# Patient Record
Sex: Male | Born: 1945
Health system: Southern US, Community
[De-identification: ages and names within clinical notes are randomized; demographics above are authoritative.]

## PROBLEM LIST (undated history)

## (undated) DIAGNOSIS — E039 Hypothyroidism, unspecified: Secondary | ICD-10-CM

## (undated) DIAGNOSIS — D649 Anemia, unspecified: Secondary | ICD-10-CM

## (undated) DIAGNOSIS — Z992 Dependence on renal dialysis: Secondary | ICD-10-CM

## (undated) DIAGNOSIS — I96 Gangrene, not elsewhere classified: Secondary | ICD-10-CM

## (undated) DIAGNOSIS — R011 Cardiac murmur, unspecified: Secondary | ICD-10-CM

## (undated) DIAGNOSIS — I739 Peripheral vascular disease, unspecified: Secondary | ICD-10-CM

## (undated) DIAGNOSIS — M2450 Contracture, unspecified joint: Secondary | ICD-10-CM

## (undated) DIAGNOSIS — R112 Nausea with vomiting, unspecified: Secondary | ICD-10-CM

## (undated) DIAGNOSIS — C9000 Multiple myeloma not having achieved remission: Secondary | ICD-10-CM

## (undated) DIAGNOSIS — I10 Essential (primary) hypertension: Secondary | ICD-10-CM

## (undated) DIAGNOSIS — E1151 Type 2 diabetes mellitus with diabetic peripheral angiopathy without gangrene: Secondary | ICD-10-CM

## (undated) DIAGNOSIS — L89152 Pressure ulcer of sacral region, stage 2: Secondary | ICD-10-CM

## (undated) DIAGNOSIS — R7881 Bacteremia: Secondary | ICD-10-CM

## (undated) DIAGNOSIS — N2581 Secondary hyperparathyroidism of renal origin: Secondary | ICD-10-CM

## (undated) DIAGNOSIS — H409 Unspecified glaucoma: Secondary | ICD-10-CM

## (undated) DIAGNOSIS — N186 End stage renal disease: Secondary | ICD-10-CM

## (undated) DIAGNOSIS — J45909 Unspecified asthma, uncomplicated: Secondary | ICD-10-CM

## (undated) DIAGNOSIS — E079 Disorder of thyroid, unspecified: Secondary | ICD-10-CM

## (undated) DIAGNOSIS — J189 Pneumonia, unspecified organism: Secondary | ICD-10-CM

## (undated) DIAGNOSIS — M1990 Unspecified osteoarthritis, unspecified site: Secondary | ICD-10-CM

## (undated) DIAGNOSIS — Z9889 Other specified postprocedural states: Secondary | ICD-10-CM

## (undated) HISTORY — PX: TOE AMPUTATION: SHX809

## (undated) HISTORY — DX: Peripheral vascular disease, unspecified: I73.9

## (undated) HISTORY — PX: CARDIAC CATHETERIZATION: SHX172

## (undated) HISTORY — DX: Gangrene, not elsewhere classified: I96

## (undated) HISTORY — DX: Bacteremia: R78.81

## (undated) HISTORY — DX: Multiple myeloma not having achieved remission: C90.00

## (undated) HISTORY — PX: EYE SURGERY: SHX253

## (undated) HISTORY — DX: Dependence on renal dialysis: N18.6

## (undated) HISTORY — DX: End stage renal disease: Z99.2

## (undated) HISTORY — DX: Disorder of thyroid, unspecified: E07.9

## (undated) HISTORY — PX: OTHER SURGICAL HISTORY: SHX169

## (undated) HISTORY — PX: CERVICAL DISC SURGERY: SHX588

## (undated) HISTORY — DX: Anemia, unspecified: D64.9

## (undated) HISTORY — DX: Pressure ulcer of sacral region, stage 2: L89.152

## (undated) HISTORY — DX: Type 2 diabetes mellitus with diabetic peripheral angiopathy without gangrene: E11.51

---

## 1997-04-19 ENCOUNTER — Encounter: Admission: RE | Admit: 1997-04-19 | Discharge: 1997-04-19 | Payer: Self-pay | Admitting: Family Medicine

## 1997-05-20 ENCOUNTER — Encounter: Admission: RE | Admit: 1997-05-20 | Discharge: 1997-05-20 | Payer: Self-pay | Admitting: Family Medicine

## 1997-06-14 ENCOUNTER — Encounter: Admission: RE | Admit: 1997-06-14 | Discharge: 1997-06-14 | Payer: Self-pay | Admitting: Family Medicine

## 1997-11-30 ENCOUNTER — Encounter: Admission: RE | Admit: 1997-11-30 | Discharge: 1997-11-30 | Payer: Self-pay | Admitting: Family Medicine

## 1998-01-04 ENCOUNTER — Encounter: Admission: RE | Admit: 1998-01-04 | Discharge: 1998-01-04 | Payer: Self-pay | Admitting: Sports Medicine

## 1998-03-03 ENCOUNTER — Encounter: Admission: RE | Admit: 1998-03-03 | Discharge: 1998-03-03 | Payer: Self-pay | Admitting: Family Medicine

## 1998-03-10 ENCOUNTER — Encounter: Admission: RE | Admit: 1998-03-10 | Discharge: 1998-03-10 | Payer: Self-pay | Admitting: Family Medicine

## 1998-04-12 ENCOUNTER — Encounter: Admission: RE | Admit: 1998-04-12 | Discharge: 1998-04-12 | Payer: Self-pay | Admitting: Family Medicine

## 1998-08-23 ENCOUNTER — Emergency Department (HOSPITAL_COMMUNITY): Admission: EM | Admit: 1998-08-23 | Discharge: 1998-08-23 | Payer: Self-pay | Admitting: Emergency Medicine

## 1998-08-24 ENCOUNTER — Emergency Department (HOSPITAL_COMMUNITY): Admission: EM | Admit: 1998-08-24 | Discharge: 1998-08-24 | Payer: Self-pay | Admitting: Emergency Medicine

## 1999-03-07 ENCOUNTER — Encounter: Payer: Self-pay | Admitting: Urology

## 1999-03-07 ENCOUNTER — Encounter: Admission: RE | Admit: 1999-03-07 | Discharge: 1999-03-07 | Payer: Self-pay | Admitting: Urology

## 1999-03-22 ENCOUNTER — Encounter: Payer: Self-pay | Admitting: Urology

## 1999-03-22 ENCOUNTER — Ambulatory Visit (HOSPITAL_COMMUNITY): Admission: RE | Admit: 1999-03-22 | Discharge: 1999-03-23 | Payer: Self-pay | Admitting: Urology

## 1999-10-22 ENCOUNTER — Encounter: Payer: Self-pay | Admitting: Neurological Surgery

## 1999-10-22 ENCOUNTER — Ambulatory Visit (HOSPITAL_COMMUNITY): Admission: RE | Admit: 1999-10-22 | Discharge: 1999-10-22 | Payer: Self-pay | Admitting: Neurological Surgery

## 1999-10-29 ENCOUNTER — Ambulatory Visit (HOSPITAL_COMMUNITY): Admission: RE | Admit: 1999-10-29 | Discharge: 1999-10-30 | Payer: Self-pay | Admitting: Neurological Surgery

## 1999-10-29 ENCOUNTER — Encounter: Payer: Self-pay | Admitting: Urology

## 1999-11-21 ENCOUNTER — Encounter: Payer: Self-pay | Admitting: Neurological Surgery

## 1999-11-21 ENCOUNTER — Encounter: Admission: RE | Admit: 1999-11-21 | Discharge: 1999-11-21 | Payer: Self-pay | Admitting: Neurological Surgery

## 2001-05-20 ENCOUNTER — Emergency Department (HOSPITAL_COMMUNITY): Admission: EM | Admit: 2001-05-20 | Discharge: 2001-05-20 | Payer: Self-pay | Admitting: Emergency Medicine

## 2001-05-21 ENCOUNTER — Encounter: Payer: Self-pay | Admitting: Emergency Medicine

## 2004-01-08 ENCOUNTER — Emergency Department: Payer: Self-pay | Admitting: Emergency Medicine

## 2006-07-30 ENCOUNTER — Emergency Department (HOSPITAL_COMMUNITY): Admission: EM | Admit: 2006-07-30 | Discharge: 2006-07-31 | Payer: Self-pay | Admitting: Emergency Medicine

## 2007-05-03 ENCOUNTER — Emergency Department (HOSPITAL_COMMUNITY): Admission: EM | Admit: 2007-05-03 | Discharge: 2007-05-03 | Payer: Self-pay | Admitting: Emergency Medicine

## 2008-03-06 ENCOUNTER — Emergency Department (HOSPITAL_COMMUNITY): Admission: EM | Admit: 2008-03-06 | Discharge: 2008-03-06 | Payer: Self-pay | Admitting: Emergency Medicine

## 2008-08-15 ENCOUNTER — Emergency Department (HOSPITAL_COMMUNITY): Admission: EM | Admit: 2008-08-15 | Discharge: 2008-08-15 | Payer: Self-pay | Admitting: Emergency Medicine

## 2008-11-08 ENCOUNTER — Emergency Department (HOSPITAL_COMMUNITY): Admission: EM | Admit: 2008-11-08 | Discharge: 2008-11-08 | Payer: Self-pay | Admitting: Emergency Medicine

## 2009-01-03 ENCOUNTER — Emergency Department (HOSPITAL_COMMUNITY): Admission: EM | Admit: 2009-01-03 | Discharge: 2009-01-03 | Payer: Self-pay | Admitting: Emergency Medicine

## 2009-07-18 ENCOUNTER — Inpatient Hospital Stay (HOSPITAL_COMMUNITY): Admission: EM | Admit: 2009-07-18 | Discharge: 2009-07-20 | Payer: Self-pay | Admitting: Emergency Medicine

## 2010-04-01 LAB — BASIC METABOLIC PANEL
BUN: 16 mg/dL (ref 6–23)
GFR calc Af Amer: 37 mL/min — ABNORMAL LOW (ref >60)
Glucose, Bld: 92 mg/dL (ref 70–99)
Potassium: 3 mEq/L — ABNORMAL LOW (ref 3.5–5.1)

## 2010-04-01 LAB — POCT I-STAT, CHEM 8
BUN: 19 mg/dL (ref 6–23)
Calcium, Ion: 1.03 mmol/L — ABNORMAL LOW (ref 1.12–1.32)
Chloride: 96 mEq/L (ref 96–112)
HCT: 38 % — ABNORMAL LOW (ref 39.0–52.0)
Hemoglobin: 12.9 g/dL — ABNORMAL LOW (ref 13.0–17.0)
Potassium: 3.1 mEq/L — ABNORMAL LOW (ref 3.5–5.1)
TCO2: 30 mmol/L (ref 0–100)

## 2010-04-01 LAB — GLUCOSE, CAPILLARY
Glucose-Capillary: 108 mg/dL — ABNORMAL HIGH (ref 70–99)
Glucose-Capillary: 120 mg/dL — ABNORMAL HIGH (ref 70–99)
Glucose-Capillary: 154 mg/dL — ABNORMAL HIGH (ref 70–99)
Glucose-Capillary: 157 mg/dL — ABNORMAL HIGH (ref 70–99)

## 2010-04-01 LAB — CBC
Hemoglobin: 11.9 g/dL — ABNORMAL LOW (ref 13.0–17.0)
MCH: 29.2 pg (ref 26.0–34.0)
MCHC: 34 g/dL (ref 30.0–36.0)
MCV: 85.9 fL (ref 78.0–100.0)
RDW: 14.9 % (ref 11.5–15.5)

## 2010-04-01 LAB — DIFFERENTIAL
Basophils Absolute: 0 10*3/uL (ref 0.0–0.1)
Eosinophils Absolute: 0.1 10*3/uL (ref 0.0–0.7)
Eosinophils Relative: 1 % (ref 0–5)
Lymphocytes Relative: 14 % (ref 12–46)
Neutro Abs: 4.6 10*3/uL (ref 1.7–7.7)
Neutrophils Relative %: 75 % (ref 43–77)

## 2010-04-01 LAB — URINE MICROSCOPIC-ADD ON

## 2010-04-01 LAB — CULTURE, BLOOD (ROUTINE X 2): Culture: NO GROWTH

## 2010-04-01 LAB — URINALYSIS, ROUTINE W REFLEX MICROSCOPIC
Glucose, UA: NEGATIVE mg/dL
Ketones, ur: NEGATIVE mg/dL
Leukocytes, UA: NEGATIVE
Protein, ur: >300 mg/dL — AB
Specific Gravity, Urine: 1.016 (ref 1.005–1.030)
Urobilinogen, UA: 2 mg/dL — ABNORMAL HIGH (ref 0.0–1.0)

## 2010-04-01 LAB — CK TOTAL AND CKMB (NOT AT ARMC): Total CK: 176 U/L (ref 7–232)

## 2010-04-01 LAB — HEMOGLOBIN A1C: Hgb A1c MFr Bld: 7.2 % — ABNORMAL HIGH (ref <5.7)

## 2010-04-16 LAB — URINE MICROSCOPIC-ADD ON

## 2010-04-16 LAB — POCT I-STAT, CHEM 8
BUN: 19 mg/dL (ref 6–23)
Calcium, Ion: 1.13 mmol/L (ref 1.12–1.32)
Creatinine, Ser: 1.7 mg/dL — ABNORMAL HIGH (ref 0.4–1.5)
Hemoglobin: 12.2 g/dL — ABNORMAL LOW (ref 13.0–17.0)
TCO2: 27 mmol/L (ref 0–100)

## 2010-04-16 LAB — URINALYSIS, ROUTINE W REFLEX MICROSCOPIC
Ketones, ur: 40 mg/dL — AB
Specific Gravity, Urine: 1.02 (ref 1.005–1.030)
Urobilinogen, UA: 4 mg/dL — ABNORMAL HIGH (ref 0.0–1.0)
pH: 6 (ref 5.0–8.0)

## 2010-04-16 LAB — URINE CULTURE: Colony Count: 8000

## 2010-04-22 LAB — URINALYSIS, ROUTINE W REFLEX MICROSCOPIC
Bilirubin Urine: NEGATIVE
Glucose, UA: NEGATIVE mg/dL
Ketones, ur: NEGATIVE mg/dL
Protein, ur: >300 mg/dL — AB

## 2010-04-22 LAB — URINE MICROSCOPIC-ADD ON

## 2010-04-30 ENCOUNTER — Other Ambulatory Visit: Payer: Self-pay | Admitting: Emergency Medicine

## 2010-05-21 ENCOUNTER — Other Ambulatory Visit: Payer: Self-pay

## 2010-05-21 NOTE — Telephone Encounter (Signed)
Needs a refill on Diltiazem 30 mg take one tablet four times a day.  Last filled 01/30/2010.  Dr. Mariah Milling is not following the patient.  The patient has not been in office and has no follow up appointment scheduled.   Refill for Diltiazem is being denied. Refill request faxed to pharmacy as denied.

## 2010-06-01 NOTE — Op Note (Signed)
[name]  Patient:    Jack [name], Jack [name]                   MRN: [id number] Proc. Date: 10/29/99 Adm. Date:  [id number] Disc. Date: [id number] Attending:  Jonne Ply                           Operative Report  PREOPERATIVE DIAGNOSIS:  C3-C4 spondylosis with myelopathy.  POSTOPERATIVE DIAGNOSIS:  C3-C4 spondylosis with myelopathy.  PROCEDURE:  Anterior cervical diskectomy and arthrodesis with C3-C4 with Synthes fixation and structural allograft.  SURGEON:  Dr. Danielle Dess.  ANESTHESIA:  General endotracheal.  INDICATIONS:  The patient is a 65 year old individual who has had significant neck, shoulder, and arm pain with progressive myelopathy in the lower extremities.  He is advised regarding surgical decompression and stabilization at C3-C4.  DESCRIPTION OF PROCEDURE:  The patient was brought to the operating room and placed on a table in supine position.  After smooth induction of general endotracheal anesthesia the neck was shaved, prepped with DuraPrep, and draped in sterile fashion.  Placed in 5 pounds of halter traction.   A transverse incision was made in the left portion of the neck, and this was carried down through the platysma.  The plane between the sternocleidomastoid and the strap muscles was dissected bluntly until the prevertebral space was reached.  C3-C4 was identified positively on a radiography, and diskectomy of C3-C4 was undertaken using a combination of curettes and rongeurs to remove the disk space.  Posterior osteophytes were removed from the inferior body of C3 and the uncinate processes at C4 using a Midas Rex and M-2 bur.  Lateral recesses were well decompressed.  A 2-mm Kerrison punch was used to take up the thickened, stretched posterior longitudinal ligament which allowed for good decompression of the dural tube.  Hemostasis was obtained with some bipolar cautery and some judicious use of Gelfoam-soaked  pledgets with thrombin which were later removed.  A 7-mm round fibular graft was placed into the interspace after this area was decompressed, traction was removed, and the neck was placed in slight flexion.  A 16-mm Synthes plate was then affixed to the anterior portion of the spinal canal using 16-mm locking screws.  The patient tolerated the procedure well.  The area was copiously irrigated with antibiotic irrigating solution.  Hemostasis was doubly checked, and the platysma was closed with 2-0 Vicryl, and 3-0 Vicryl was used subcuticularly on the skin.  The patient tolerated the procedure well. DD:  11/15/99 TD:  11/16/99 Job: [id number]

## 2010-10-09 LAB — DIFFERENTIAL
Basophils Relative: 0
Lymphocytes Relative: 16
Lymphs Abs: 1.3
Monocytes Relative: 11
Neutro Abs: 6.2
Neutrophils Relative %: 72

## 2010-10-09 LAB — POCT I-STAT, CHEM 8
Chloride: 95 — ABNORMAL LOW
Glucose, Bld: 267 — ABNORMAL HIGH
HCT: 41
Potassium: 3.2 — ABNORMAL LOW

## 2010-10-09 LAB — RAPID STREP SCREEN (MED CTR MEBANE ONLY): Streptococcus, Group A Screen (Direct): NEGATIVE

## 2010-10-09 LAB — CBC
MCHC: 32.5
RBC: 4.68
WBC: 8.5

## 2010-10-29 LAB — I-STAT 8, (EC8 V) (CONVERTED LAB)
BUN: 14
Glucose, Bld: 221 — ABNORMAL HIGH
Hemoglobin: 13.9
Potassium: 3.1 — ABNORMAL LOW
Sodium: 140

## 2010-10-29 LAB — CBC
Hemoglobin: 12.5 — ABNORMAL LOW
MCHC: 33.7
RBC: 4.43

## 2010-10-29 LAB — POCT I-STAT CREATININE
Creatinine, Ser: 1.4
Operator id: 272551

## 2010-12-31 ENCOUNTER — Telehealth: Payer: Self-pay | Admitting: *Deleted

## 2010-12-31 NOTE — Telephone Encounter (Signed)
patient confirmed over the phone on 01-31-2011 starting at 8:30am

## 2010-12-31 NOTE — Telephone Encounter (Signed)
Na

## 2011-01-01 ENCOUNTER — Telehealth: Payer: Self-pay | Admitting: Oncology

## 2011-01-01 NOTE — Telephone Encounter (Signed)
Referred by Dr. Arrie Aran Dx- MGUS

## 2011-01-07 ENCOUNTER — Telehealth: Payer: Self-pay | Admitting: *Deleted

## 2011-01-07 NOTE — Telephone Encounter (Signed)
PATIENT CONFIRMED OVER THE PHONE THE NEW DATE AND TIME ON 01-28-2011 STARTING AT 8:30AM

## 2011-01-28 ENCOUNTER — Telehealth: Payer: Self-pay | Admitting: Oncology

## 2011-01-28 ENCOUNTER — Other Ambulatory Visit: Payer: Medicare Other | Admitting: Lab

## 2011-01-28 ENCOUNTER — Other Ambulatory Visit: Payer: Self-pay | Admitting: Oncology

## 2011-01-28 ENCOUNTER — Ambulatory Visit: Payer: Medicare Other

## 2011-01-28 ENCOUNTER — Ambulatory Visit (HOSPITAL_BASED_OUTPATIENT_CLINIC_OR_DEPARTMENT_OTHER): Payer: Medicare Other | Admitting: Oncology

## 2011-01-28 ENCOUNTER — Ambulatory Visit (HOSPITAL_COMMUNITY)
Admission: RE | Admit: 2011-01-28 | Discharge: 2011-01-28 | Disposition: A | Discharge status: Home / Self Care | Payer: Medicare Other | Source: Ambulatory Visit | Attending: Oncology | Admitting: Oncology

## 2011-01-28 ENCOUNTER — Ambulatory Visit (HOSPITAL_BASED_OUTPATIENT_CLINIC_OR_DEPARTMENT_OTHER): Payer: Medicare Other

## 2011-01-28 DIAGNOSIS — E119 Type 2 diabetes mellitus without complications: Secondary | ICD-10-CM

## 2011-01-28 DIAGNOSIS — I10 Essential (primary) hypertension: Secondary | ICD-10-CM | POA: Insufficient documentation

## 2011-01-28 DIAGNOSIS — M47812 Spondylosis without myelopathy or radiculopathy, cervical region: Secondary | ICD-10-CM | POA: Insufficient documentation

## 2011-01-28 DIAGNOSIS — D649 Anemia, unspecified: Secondary | ICD-10-CM | POA: Insufficient documentation

## 2011-01-28 DIAGNOSIS — D472 Monoclonal gammopathy: Secondary | ICD-10-CM

## 2011-01-28 LAB — CBC & DIFF AND RETIC
Basophils Absolute: 0 10*3/uL (ref 0.0–0.1)
Eosinophils Absolute: 0.1 10*3/uL (ref 0.0–0.5)
HCT: 32.8 % — ABNORMAL LOW (ref 38.4–49.9)
HGB: 10.6 g/dL — ABNORMAL LOW (ref 13.0–17.1)
Immature Retic Fract: 6.2 % (ref 3.00–10.60)
MCH: 26.7 pg — ABNORMAL LOW (ref 27.2–33.4)
MCV: 82.6 fL (ref 79.3–98.0)
NEUT#: 4 10*3/uL (ref 1.5–6.5)
NEUT%: 69.3 % (ref 39.0–75.0)
RDW: 14.7 % — ABNORMAL HIGH (ref 11.0–14.6)
Retic Ct Abs: 45.66 10*3/uL (ref 34.80–93.90)
lymph#: 1 10*3/uL (ref 0.9–3.3)

## 2011-01-28 NOTE — Patient Instructions (Signed)
Multiple Myeloma Multiple myeloma is the most common cancer of bone. It is caused by the uncontrolled multiplication of a type of white blood cell in the marrow. This white blood cell is called a plasma cell. This means the bone marrow is overworking producing plasma cells. Soon these overproduced cells begin to take up room in the marrow that is needed by other cells. This means that there are soon not enough red or white blood cells or platelets. Not enough red cells mean that the person is anemic. There are not enough red blood cells to carry oxygen around the body. There are not enough white blood cells to fight disease. This causes the person with multiple myeloma to not feel well. There is also bone pain through much of the body. SYMPTOMS  Anemia causes fatigue (tiredness) and weakness.     Back pain is common. This is from fractures (break in bones) caused by damage to the bones of the back.     Lack of white blood cells makes infection more likely.     Bleeding is a common problem from lack of the cells (platelets). Platelets help blood clots form. This may show up as bleeding from any place. Commonly this shows up as bleeding from the nose or gums.     Fractures (bone breaks) are more common anywhere. The back and ribs are the most commonly fractured areas.  DIAGNOSIS   This tumor is often suggested by blood tests. Often doing a bone marrow sample makes the diagnosis (learning what is wrong). This is a test performed by taking a small sample of bone with a small needle. This bone often comes from the sternum (breast bone). This sample is sent to a pathologist (a specialist in looking at tissue under a microscope). After looking at the sample under the microscope, the pathologist is able to make a diagnosis of the problem. X-rays may also show boney changes. TREATMENT    Occasionally, anti-cancer medications may be used with multiple myeloma. Your caregiver can discuss this with you.      Medications can also be given to help with the bone pain.     There is no cure for multiple myeloma. Lifestyle changes can add years of quality living.  HOME CARE INSTRUCTIONS   Often there is no specific treatment for multiple myeloma. Most of the treatment consists of adjustments in dietary and living activities. Some of these changes include:  Your dietitian or caregiver helping you with your dietary questions.     Taking iron and vitamins as prescribed by your caregiver.     Eating a well balanced diet.     Staying active, but follow restrictions suggested by your caregiver. Avoiding heavy lifting (more than 10 pounds) and activities that cause increased pain.     Drinking plenty of water.     Using back braces and a cane may help with some of the boney pain.  SEEK IMMEDIATE MEDICAL CARE IF:  You develop severe, uncontrolled boney pain.     You or your family notices confusion, problems with decision-making or inability to stay awake.     You notice increased urination or constipation.     You notice problems holding your water or stool.     You have numbness or loss of control of your extremities (arms/hands or legs/feet).  Document Released: 09/25/2000 Document Revised: 09/12/2010 Document Reviewed: 12/27/2007 [facility] Patient Information 2012 [place], [place].Multiple Myeloma Multiple myeloma is the most common cancer of bone. It  is caused by the uncontrolled multiplication of a type of white blood cell in the marrow. This white blood cell is called a plasma cell. This means the bone marrow is overworking producing plasma cells. Soon these overproduced cells begin to take up room in the marrow that is needed by other cells. This means that there are soon not enough red or white blood cells or platelets. Not enough red cells mean that the person is anemic. There are not enough red blood cells to carry oxygen around the body. There are not enough white blood cells to fight  disease. This causes the person with multiple myeloma to not feel well. There is also bone pain through much of the body. SYMPTOMS  Anemia causes fatigue (tiredness) and weakness.     Back pain is common. This is from fractures (break in bones) caused by damage to the bones of the back.     Lack of white blood cells makes infection more likely.     Bleeding is a common problem from lack of the cells (platelets). Platelets help blood clots form. This may show up as bleeding from any place. Commonly this shows up as bleeding from the nose or gums.     Fractures (bone breaks) are more common anywhere. The back and ribs are the most commonly fractured areas.  DIAGNOSIS   This tumor is often suggested by blood tests. Often doing a bone marrow sample makes the diagnosis (learning what is wrong). This is a test performed by taking a small sample of bone with a small needle. This bone often comes from the sternum (breast bone). This sample is sent to a pathologist (a specialist in looking at tissue under a microscope). After looking at the sample under the microscope, the pathologist is able to make a diagnosis of the problem. X-rays may also show boney changes. TREATMENT    Occasionally, anti-cancer medications may be used with multiple myeloma. Your caregiver can discuss this with you.     Medications can also be given to help with the bone pain.     There is no cure for multiple myeloma. Lifestyle changes can add years of quality living.  HOME CARE INSTRUCTIONS   Often there is no specific treatment for multiple myeloma. Most of the treatment consists of adjustments in dietary and living activities. Some of these changes include:  Your dietitian or caregiver helping you with your dietary questions.     Taking iron and vitamins as prescribed by your caregiver.     Eating a well balanced diet.     Staying active, but follow restrictions suggested by your caregiver. Avoiding heavy lifting (more  than 10 pounds) and activities that cause increased pain.     Drinking plenty of water.     Using back braces and a cane may help with some of the boney pain.  SEEK IMMEDIATE MEDICAL CARE IF:  You develop severe, uncontrolled boney pain.     You or your family notices confusion, problems with decision-making or inability to stay awake.     You notice increased urination or constipation.     You notice problems holding your water or stool.     You have numbness or loss of control of your extremities (arms/hands or legs/feet).  Document Released: 09/25/2000 Document Revised: 09/12/2010 Document Reviewed: 12/27/2007 [facility] Patient Information 2012 [place], [place].

## 2011-01-28 NOTE — Telephone Encounter (Signed)
gve the pt his jan,feb 2013 appt calendar along with the skeletal bone survey appt

## 2011-01-28 NOTE — Progress Notes (Signed)
Referral MD Dr Sherwood Gambler; Dr Lowella Bandy  N/A    Reason for Referral: Elevated m-spike  No chief complaint on file. :   HPI: This pt has been seen by nephrology for an elevated Cr. Work-up has shown an SPEP with an m-spike of 1.5 g/dl, a total of IgG of 1610 mg/dl and a monoclonal protein with kappa light chain specificity. Total kappa light chains of 74 mg/ L and a kappa/lambda ratio of 2.44.  PMHx: 1. Hypertension  2. Type 2 DM  X 30 y 3. Hyperlipidemia 4. Thyroiditis s/p radioiodine ablation   5.    No past surgical history on file.:  Current outpatient prescriptions:amLODipine (NORVASC) 10 MG tablet, Take 10 mg by mouth daily., Disp: , Rfl: ;  aspirin 81 MG tablet, Take 160 mg by mouth daily., Disp: , Rfl: ;  doxazosin (CARDURA) 4 MG tablet, Take 4 mg by mouth daily., Disp: , Rfl: ;  furosemide (LASIX) 20 MG tablet, Take 20 mg by mouth daily., Disp: , Rfl: ;  levothyroxine (SYNTHROID, LEVOTHROID) 200 MCG tablet, Take 300 mcg by mouth daily. One and half tabs daily, Disp: , Rfl:  saxagliptin HCl (ONGLYZA) 2.5 MG TABS tablet, Take 5 mg by mouth daily., Disp: , Rfl: ;  spironolactone (ALDACTONE) 25 MG tablet, Take 25 mg by mouth daily., Disp: , Rfl: :    :  Allergies  Allergen Reactions  . Ivp Dye (Iodinated Diagnostic Agents) Hives  :  FmHx:  Mother had ?myeloma Father died at age 58 from renal failure 1 brother with IHD  History   Social History  . Marital Status: Legally Separated, previously widowed in 08-Jul-1987, wife died of colon cancer.    Spouse Name: N/A    Number of Children: 3  daughters , 60, 52 and 35., whome he lives with.   . Years of Education: N/A   Occupational History  . Not on file.   Social History Main Topics  . Smoking status: Not on file  . Smokeless tobacco: Previously smoked till July 08, 1982.  Marland Kitchen Alcohol Use: Drinks vodka frequently  . Drug Use: Not on file  . Sexually Active: Not on file   Other Topics Concern  . 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   :  Constitutional: positive for fatigue Cardiovascular: positive for chest pain and dyspnea Gastrointestinal: positive for constipation Musculoskeletal:positive for back pain  Exam:  General appearance: alert, cooperative and appears stated age Eyes: conjunctivae/corneas clear. PERRL, EOM's intact. Fundi benign. Throat: lips, mucosa, and tongue normal; teeth and gums normal Resp: clear to auscultation bilaterally and normal percussion bilaterally Breasts: normal appearance, no masses or tenderness, gynecomastia  Inspection negative Cardio: regular rate and rhythm, S1, S2 normal, 3/6 systolic  murmur, click, rub or gallop and normal apical impulse GI: soft, non-tender; bowel sounds normal; no masses,  no organomegaly Extremities: extremities normal, atraumatic, no cyanosis or edema   Basename 01/28/11 1107  WBC 5.8  HGB 10.6*  HCT 32.8*  PLT 187    Blood smear review: n/a/  Pathology:n/a  Dg Bone Survey Met  01/28/2011  *RADIOLOGY REPORT*  Clinical Data: Staging myeloma  METASTATIC BONE SURVEY  Comparison: None.  Findings: Lateral skull:  No worrisome lytic foci.  Cervical spine two views:  Previous ACDF C3-4.  Degenerative spondylosis C2-3.  No lytic foci.  Thoracic spine two views:  No fracture or lytic foci.  Ordinary degenerative changes.  Lumbar spine  two views:  No fracture or lytic foci.  Pronounced degenerative changes at L4-5.  AP pelvis one-view:  No definable lytic foci.  Left hip and femur two views:  No lytic foci.  Right hip and femur two views:  No lytic foci.  Left shoulder and humerus two views:  No worrisome lytic foci. Ordinary degenerative changes in the region of the shoulder.  Right shoulder and humerus two views:  No worrisome lytic foci. Ordinary degenerative changes in the region of the shoulder.  IMPRESSION: No lytic foci demonstrated.  Original Report Authenticated By: Thomasenia Sales,  M.D.    Assessment and Plan:   66 yo man with hx of DM2, hypertension and renal failure . He has evidence of monoclonal gammopathy with presence of light chains. I will pursue a bone marrow biopsy and skeletal survey.   I will f/u after ~ 2weeks . He has not had a colonoscopy. In view of his anemia , this will be pursued.  60 minutes were spent with the patient, 1/2 that time in patient related counselling.

## 2011-01-30 ENCOUNTER — Ambulatory Visit: Payer: Medicare Other | Admitting: Oncology

## 2011-01-30 LAB — ERYTHROPOIETIN: Erythropoietin: 8.4 m[IU]/mL (ref 2.6–34.0)

## 2011-01-30 LAB — SPEP & IFE WITH QIG
Albumin ELP: 53.6 % — ABNORMAL LOW (ref 55.8–66.1)
Beta 2: 21.7 % — ABNORMAL HIGH (ref 3.2–6.5)
Beta Globulin: 4.3 % — ABNORMAL LOW (ref 4.7–7.2)
Gamma Globulin: 9.4 % — ABNORMAL LOW (ref 11.1–18.8)
IgA: 120 mg/dL (ref 68–379)
IgM, Serum: 12 mg/dL — ABNORMAL LOW (ref 41–251)

## 2011-01-30 LAB — KAPPA/LAMBDA LIGHT CHAINS
Kappa:Lambda Ratio: 2.8 — ABNORMAL HIGH (ref 0.26–1.65)
Lambda Free Lght Chn: 3.11 mg/dL — ABNORMAL HIGH (ref 0.57–2.63)

## 2011-01-30 LAB — URIC ACID: Uric Acid, Serum: 9.4 mg/dL — ABNORMAL HIGH (ref 4.0–7.8)

## 2011-01-30 LAB — FERRITIN: Ferritin: 153 ng/mL (ref 22–322)

## 2011-01-30 LAB — SEDIMENTATION RATE: Sed Rate: 47 mm/hr — ABNORMAL HIGH (ref 0–16)

## 2011-01-30 LAB — PSA: PSA: 9.32 ng/mL — ABNORMAL HIGH (ref <=4.00)

## 2011-01-30 LAB — IRON AND TIBC
%SAT: 13 % — ABNORMAL LOW (ref 20–55)
Iron: 35 ug/dL — ABNORMAL LOW (ref 42–165)

## 2011-01-31 ENCOUNTER — Other Ambulatory Visit (HOSPITAL_COMMUNITY): Payer: Medicare Other

## 2011-01-31 ENCOUNTER — Ambulatory Visit: Payer: Self-pay | Admitting: Oncology

## 2011-01-31 ENCOUNTER — Other Ambulatory Visit: Payer: Self-pay | Admitting: Lab

## 2011-01-31 ENCOUNTER — Telehealth: Payer: Self-pay

## 2011-01-31 ENCOUNTER — Other Ambulatory Visit: Payer: Self-pay

## 2011-01-31 ENCOUNTER — Telehealth: Payer: Self-pay | Admitting: *Deleted

## 2011-01-31 ENCOUNTER — Ambulatory Visit: Payer: Self-pay

## 2011-01-31 NOTE — Telephone Encounter (Signed)
Message copied by Gerre Scull on Thu Jan 31, 2011 10:17 AM ------      Message from: MCDOWELL, Riley Lam D      Created: Thu Jan 31, 2011  9:50 AM       Dr. Donnie Coffin,      This message along with messages for Roslyn Minor (pet scan) and Carolynn Serve (lab) was sent to [facility] as a staff message. While I do see a lab order for Carolynn Serve and the Urology referral for [employer] there is no pet scan order for Roslyn Minor. When entering orders for your patients you will need to add you orders and then route the oncology treatment details to the scheduling pool and your assigned scheduler Jacki Cones) will take care of scheduling the appointments. I will let Jacki Cones know you have pending orders for Carolynn Serve and [name]. Please enter a pet scan order for Roslyn Minor and use the oncology treatment details to route it to scheduling. Please remember we cannot schedule without orders. Thanks for your help.            Melissa      ----- Message -----         From: Pierce Crane, MD         Sent: 01/30/2011   8:59 PM           To: Rhae Lerner            pls make referral to urology      tx      p

## 2011-01-31 NOTE — Telephone Encounter (Signed)
bmbx scheduled with Maryjane Hurter scheduler for 1/30 per Dr Donnie Coffin. Pt to arrive at 0815 for lab and procedure.  Margaret, in flow cytometry verifies date and time.   Pt notified and verbalizes that he will be at appt.  dph

## 2011-02-01 ENCOUNTER — Other Ambulatory Visit: Payer: Self-pay

## 2011-02-08 ENCOUNTER — Ambulatory Visit (INDEPENDENT_AMBULATORY_CARE_PROVIDER_SITE_OTHER): Payer: Medicare Other | Admitting: *Deleted

## 2011-02-08 DIAGNOSIS — I10 Essential (primary) hypertension: Secondary | ICD-10-CM

## 2011-02-13 ENCOUNTER — Other Ambulatory Visit: Payer: Self-pay | Admitting: Oncology

## 2011-02-13 ENCOUNTER — Encounter: Payer: Medicare Other | Admitting: *Deleted

## 2011-02-13 ENCOUNTER — Ambulatory Visit: Payer: Medicare Other

## 2011-02-13 ENCOUNTER — Telehealth: Payer: Self-pay | Admitting: *Deleted

## 2011-02-13 ENCOUNTER — Other Ambulatory Visit (HOSPITAL_BASED_OUTPATIENT_CLINIC_OR_DEPARTMENT_OTHER): Payer: Medicare Other | Admitting: Lab

## 2011-02-13 ENCOUNTER — Other Ambulatory Visit (HOSPITAL_COMMUNITY)
Admission: RE | Admit: 2011-02-13 | Discharge: 2011-02-13 | Disposition: A | Discharge status: Home / Self Care | Payer: Medicare Other | Source: Ambulatory Visit | Attending: Oncology | Admitting: Oncology

## 2011-02-13 ENCOUNTER — Ambulatory Visit (HOSPITAL_BASED_OUTPATIENT_CLINIC_OR_DEPARTMENT_OTHER): Payer: Medicare Other | Admitting: Oncology

## 2011-02-13 VITALS — BP 182/91 | HR 82 | Temp 98.3°F

## 2011-02-13 DIAGNOSIS — D472 Monoclonal gammopathy: Secondary | ICD-10-CM | POA: Insufficient documentation

## 2011-02-13 DIAGNOSIS — D539 Nutritional anemia, unspecified: Secondary | ICD-10-CM | POA: Insufficient documentation

## 2011-02-13 LAB — DIFFERENTIAL
Eosinophils Absolute: 0.1 10*3/uL (ref 0.0–0.7)
Eosinophils Relative: 2 % (ref 0–5)
Lymphocytes Relative: 25 % (ref 12–46)
Lymphs Abs: 1.2 10*3/uL (ref 0.7–4.0)
Monocytes Absolute: 0.4 10*3/uL (ref 0.1–1.0)

## 2011-02-13 LAB — CBC
HCT: 31.8 % — ABNORMAL LOW (ref 39.0–52.0)
MCH: 27.2 pg (ref 26.0–34.0)
MCV: 83 fL (ref 78.0–100.0)
RBC: 3.83 MIL/uL — ABNORMAL LOW (ref 4.22–5.81)
RDW: 14.1 % (ref 11.5–15.5)
WBC: 5 10*3/uL (ref 4.0–10.5)

## 2011-02-13 NOTE — Procedures (Unsigned)
RENAL ARTERY DUPLEX EVALUATION  INDICATION:  Acute kidney injury.  HISTORY: Diabetes:  Type 2. Cardiac: Hypertension:  Yes. Smoking:  Previous.  RENAL ARTERY DUPLEX FINDINGS: Aorta-Proximal:  131 cm/s Aorta-Mid:  112 cm/s Aorta-Distal:  127 cm/s Celiac Artery Origin:  188 cm/s SMA Origin:  189 cm/s                                   RIGHT               LEFT Renal Artery Origin:             164 cm/s            67 cm/s Renal Artery Proximal:           92 cm/s             89 cm/s Renal Artery Mid:                52 cm/s             90 cm/s Renal Artery Distal:             55 cm/s             71 cm/s Hilar Acceleration Time (AT): Renal-Aortic Ratio (RAR):        0.79                0.68 Kidney Size:                     11.5 cm             10.8 cm End Diastolic Ratio (EDR): Resistive Index (RI):  IMPRESSION: 1. Velocities in both renal arteries appear within normal range. 2. The renal artery ratios are within normal limits.  ___________________________________________ Quita Skye Hart [place], M.D.  SS/MEDQ  D:  02/08/2011  T:  02/08/2011  Job:  [id number]

## 2011-02-13 NOTE — Patient Instructions (Signed)
Left iliac crest occlusive dressing clean, dry , and intact.   Post BM Bx discharge given to pt.  VSS;  Pt was escorted to lab by Stanton Kidney, NT prior to discharge home today.

## 2011-02-13 NOTE — Progress Notes (Signed)
Bone Marrow Biopsy and Aspiration Procedure Note   Informed consent was obtained and potential risks including bleeding, infection and pain were reviewed with the patient. A time out procedure was held.  Posterior iliac crest(s) prepped with Betadine.   Lidocaine 2% local anesthesia infiltrated into the subcutaneous tissue.  Left bone marrow biopsy and left bone marrow aspirate was obtained.   The procedure was tolerated well and there were no complications.  Specimens sent for: routine histopathologic stains and sectioning and flow cytometry   Pierce Crane, MD [facility] [phone] (beeper) [phone] (Office)  02/13/2011, 9:35 AM

## 2011-02-13 NOTE — Telephone Encounter (Signed)
erronous encounter

## 2011-02-17 ENCOUNTER — Encounter: Payer: Self-pay | Admitting: Oncology

## 2011-02-20 ENCOUNTER — Ambulatory Visit: Payer: Medicare Other | Admitting: Oncology

## 2011-02-28 ENCOUNTER — Ambulatory Visit (HOSPITAL_BASED_OUTPATIENT_CLINIC_OR_DEPARTMENT_OTHER): Payer: Medicare Other | Admitting: Oncology

## 2011-02-28 DIAGNOSIS — D472 Monoclonal gammopathy: Secondary | ICD-10-CM

## 2011-02-28 DIAGNOSIS — N400 Enlarged prostate without lower urinary tract symptoms: Secondary | ICD-10-CM

## 2011-02-28 DIAGNOSIS — D649 Anemia, unspecified: Secondary | ICD-10-CM

## 2011-02-28 MED ORDER — DARBEPOETIN ALFA-POLYSORBATE 300 MCG/0.6ML IJ SOLN
300.0000 ug | Freq: Once | INTRAMUSCULAR | Status: AC
  Administered 2011-02-28: 300 ug via SUBCUTANEOUS
  Filled 2011-02-28: qty 0.6

## 2011-02-28 NOTE — Progress Notes (Signed)
Hematology and Oncology Follow Up Visit  Jack [name] [id number] 1945/04/15 66 y.o. 02/28/2011 12:15 PM PCPdr coladonatto  Principle Diagnosis: MGUS/renal failure  Interim History:  There have been no intercurrent illness, hospitalizations or medication changes.He has not seen the urologist, he is known by them anddoes have prostatic hypertrophy  Medications: I have reviewed the patient's current medications.  Allergies:  Allergies  Allergen Reactions  . Ivp Dye (Iodinated Diagnostic Agents) Hives    Past Medical History, Surgical history, Social history, and Family History were reviewed and updated.  Review of Systems: Constitutional:  Negative for fever, chills, night sweats, anorexia, weight loss, pain. Cardiovascular: no chest pain or dyspnea on exertion Respiratory: no cough, shortness of breath, or wheezing Neurological: negative Dermatological: negative ENT: negative Skin Gastrointestinal: no abdominal pain, change in bowel habits, or black or bloody stools Genito-Urinary: negative Hematological and Lymphatic: negative Breast: negative Musculoskeletal: negative Remaining ROS negative.  Physical Exam: Blood pressure 173/78, pulse 78, temperature 98.2 F (36.8 C), temperature source Oral, height 5' 9.5" (1.765 m), weight 212 lb 14.4 oz (96.571 kg). ECOG: 0 General appearance: alert, cooperative, appears stated age and cachectic Head: Normocephalic, without obvious abnormality, atraumatic Neck: no adenopathy, no carotid bruit, no JVD, supple, symmetrical, trachea midline and thyroid not enlarged, symmetric, no tenderness/mass/nodules Lymph nodes: Cervical, supraclavicular, and axillary nodes normal. Cardiac : regular rate and rhythm, no murmurs or gallops Pulmonary:clear to auscultation bilaterally and normal percussion bilaterally Breasts: inspection negative, no nipple discharge or bleeding, no masses or nodularity palpable Abdomen:soft, non-tender; bowel  sounds normal; no masses,  no organomegaly Extremities negative Neuro: alert, oriented, normal speech, no focal findings or movement disorder noted  Lab Results: Lab Results  Component Value Date   WBC 5.0 02/13/2011   HGB 10.4* 02/13/2011   HCT 31.8* 02/13/2011   MCV 83.0 02/13/2011   PLT 331 02/13/2011     Chemistry      Component Value Date/Time   NA 137 07/20/2009 0435   K 3.0* 07/20/2009 0435   CL 100 07/20/2009 0435   CO2 30 07/20/2009 0435   BUN 16 07/20/2009 0435   CREATININE 2.17* 07/20/2009 0435      Component Value Date/Time   CALCIUM 8.0* 07/20/2009 0435      .pathology. Radiological Studies: chest X-ran/a Mammogram n/a Bone density n/a  Impression and Plan: Hx renal failure, anemia . We reviewed  results of his recent bone marrow results, etc. At this point it is conceivable he has an evolving myeloma vs active disease causing renal dysfunction. I have proposed that we recheck labs in 2 months and begin aranesp.  More than 50% of the visit was spent in patient-related counselling   Pierce Crane, MD 2/14/201312:15 PM

## 2011-03-06 ENCOUNTER — Telehealth: Payer: Self-pay | Admitting: *Deleted

## 2011-03-06 NOTE — Telephone Encounter (Signed)
left voice message to inform the patient he needs to contact the billing department at urology before they will schedule an appointment to see the dr. Pamalee Leyden

## 2011-03-13 ENCOUNTER — Encounter: Payer: Self-pay | Admitting: Oncology

## 2011-03-28 ENCOUNTER — Ambulatory Visit (HOSPITAL_BASED_OUTPATIENT_CLINIC_OR_DEPARTMENT_OTHER): Payer: Medicare Other | Admitting: Lab

## 2011-03-28 ENCOUNTER — Ambulatory Visit: Payer: Medicare Other

## 2011-03-28 DIAGNOSIS — D472 Monoclonal gammopathy: Secondary | ICD-10-CM

## 2011-03-28 LAB — CBC WITH DIFFERENTIAL/PLATELET
BASO%: 1.4 % (ref 0.0–2.0)
Basophils Absolute: 0.1 10*3/uL (ref 0.0–0.1)
EOS%: 2.6 % (ref 0.0–7.0)
HCT: 37.7 % — ABNORMAL LOW (ref 38.4–49.9)
HGB: 12.2 g/dL — ABNORMAL LOW (ref 13.0–17.1)
MCH: 27.5 pg (ref 27.2–33.4)
MCHC: 32.3 g/dL (ref 32.0–36.0)
MCV: 85.3 fL (ref 79.3–98.0)
MONO%: 8.1 % (ref 0.0–14.0)
NEUT%: 50.6 % (ref 39.0–75.0)

## 2011-03-28 NOTE — Patient Instructions (Signed)
Shot held today b/c of h/h.  Pt informed and to call for questions/concerns. shk

## 2011-04-30 ENCOUNTER — Other Ambulatory Visit: Payer: Medicare Other | Admitting: Lab

## 2011-05-02 ENCOUNTER — Other Ambulatory Visit: Payer: Self-pay | Admitting: Oncology

## 2011-05-02 ENCOUNTER — Other Ambulatory Visit (HOSPITAL_BASED_OUTPATIENT_CLINIC_OR_DEPARTMENT_OTHER): Payer: Medicare Other | Admitting: Lab

## 2011-05-02 DIAGNOSIS — D472 Monoclonal gammopathy: Secondary | ICD-10-CM

## 2011-05-02 LAB — CBC & DIFF AND RETIC
BASO%: 0.4 % (ref 0.0–2.0)
Basophils Absolute: 0 10*3/uL (ref 0.0–0.1)
EOS%: 2.3 % (ref 0.0–7.0)
Immature Retic Fract: 8.9 % (ref 3.00–10.60)
MCH: 27 pg — ABNORMAL LOW (ref 27.2–33.4)
MCHC: 31.6 g/dL — ABNORMAL LOW (ref 32.0–36.0)
MCV: 85.3 fL (ref 79.3–98.0)
MONO#: 0.4 10*3/uL (ref 0.1–0.9)
MONO%: 8.8 % (ref 0.0–14.0)
RBC: 4.08 10*6/uL — ABNORMAL LOW (ref 4.20–5.82)
RDW: 15.8 % — ABNORMAL HIGH (ref 11.0–14.6)
Retic %: 1.21 % (ref 0.80–1.80)
Retic Ct Abs: 49.37 10*3/uL (ref 34.80–93.90)
lymph#: 1.6 10*3/uL (ref 0.9–3.3)

## 2011-12-18 ENCOUNTER — Other Ambulatory Visit: Payer: Self-pay | Admitting: Nephrology

## 2011-12-18 DIAGNOSIS — R270 Ataxia, unspecified: Secondary | ICD-10-CM

## 2011-12-19 ENCOUNTER — Other Ambulatory Visit: Payer: Self-pay | Admitting: Nephrology

## 2011-12-19 DIAGNOSIS — Z139 Encounter for screening, unspecified: Secondary | ICD-10-CM

## 2011-12-20 ENCOUNTER — Ambulatory Visit
Admission: RE | Admit: 2011-12-20 | Discharge: 2011-12-20 | Disposition: A | Discharge status: Home / Self Care | Payer: Medicare Other | Source: Ambulatory Visit | Attending: Nephrology | Admitting: Nephrology

## 2011-12-20 DIAGNOSIS — R270 Ataxia, unspecified: Secondary | ICD-10-CM

## 2011-12-20 DIAGNOSIS — Z139 Encounter for screening, unspecified: Secondary | ICD-10-CM

## 2012-02-12 NOTE — Progress Notes (Signed)
[facility]KA today.  Letter mailed to patient.  

## 2012-03-18 ENCOUNTER — Encounter (HOSPITAL_COMMUNITY): Payer: Self-pay | Admitting: Neurology

## 2012-03-18 ENCOUNTER — Inpatient Hospital Stay (HOSPITAL_COMMUNITY)
Admission: EM | Admit: 2012-03-18 | Discharge: 2012-03-24 | DRG: 682 | Disposition: A | Discharge status: Home / Self Care | Payer: Medicare Other | Attending: Internal Medicine | Admitting: Internal Medicine

## 2012-03-18 ENCOUNTER — Emergency Department (HOSPITAL_COMMUNITY): Payer: Medicare Other

## 2012-03-18 ENCOUNTER — Inpatient Hospital Stay (HOSPITAL_COMMUNITY): Payer: Medicare Other

## 2012-03-18 DIAGNOSIS — I498 Other specified cardiac arrhythmias: Secondary | ICD-10-CM | POA: Diagnosis present

## 2012-03-18 DIAGNOSIS — I10 Essential (primary) hypertension: Secondary | ICD-10-CM

## 2012-03-18 DIAGNOSIS — N189 Chronic kidney disease, unspecified: Secondary | ICD-10-CM

## 2012-03-18 DIAGNOSIS — N2581 Secondary hyperparathyroidism of renal origin: Secondary | ICD-10-CM | POA: Diagnosis present

## 2012-03-18 DIAGNOSIS — I459 Conduction disorder, unspecified: Secondary | ICD-10-CM | POA: Insufficient documentation

## 2012-03-18 DIAGNOSIS — E109 Type 1 diabetes mellitus without complications: Secondary | ICD-10-CM | POA: Diagnosis present

## 2012-03-18 DIAGNOSIS — R001 Bradycardia, unspecified: Secondary | ICD-10-CM | POA: Diagnosis present

## 2012-03-18 DIAGNOSIS — N184 Chronic kidney disease, stage 4 (severe): Secondary | ICD-10-CM | POA: Diagnosis present

## 2012-03-18 DIAGNOSIS — J9601 Acute respiratory failure with hypoxia: Secondary | ICD-10-CM

## 2012-03-18 DIAGNOSIS — N185 Chronic kidney disease, stage 5: Secondary | ICD-10-CM

## 2012-03-18 DIAGNOSIS — E119 Type 2 diabetes mellitus without complications: Secondary | ICD-10-CM

## 2012-03-18 DIAGNOSIS — E611 Iron deficiency: Secondary | ICD-10-CM

## 2012-03-18 DIAGNOSIS — N179 Acute kidney failure, unspecified: Secondary | ICD-10-CM | POA: Diagnosis present

## 2012-03-18 DIAGNOSIS — D638 Anemia in other chronic diseases classified elsewhere: Secondary | ICD-10-CM | POA: Diagnosis present

## 2012-03-18 DIAGNOSIS — E8770 Fluid overload, unspecified: Secondary | ICD-10-CM

## 2012-03-18 DIAGNOSIS — D631 Anemia in chronic kidney disease: Secondary | ICD-10-CM

## 2012-03-18 DIAGNOSIS — E108 Type 1 diabetes mellitus with unspecified complications: Secondary | ICD-10-CM

## 2012-03-18 DIAGNOSIS — E871 Hypo-osmolality and hyponatremia: Secondary | ICD-10-CM

## 2012-03-18 DIAGNOSIS — I129 Hypertensive chronic kidney disease with stage 1 through stage 4 chronic kidney disease, or unspecified chronic kidney disease: Principal | ICD-10-CM | POA: Diagnosis present

## 2012-03-18 DIAGNOSIS — E872 Acidosis, unspecified: Secondary | ICD-10-CM

## 2012-03-18 DIAGNOSIS — J9600 Acute respiratory failure, unspecified whether with hypoxia or hypercapnia: Secondary | ICD-10-CM | POA: Diagnosis present

## 2012-03-18 DIAGNOSIS — E039 Hypothyroidism, unspecified: Secondary | ICD-10-CM

## 2012-03-18 DIAGNOSIS — D472 Monoclonal gammopathy: Secondary | ICD-10-CM

## 2012-03-18 DIAGNOSIS — Z87891 Personal history of nicotine dependence: Secondary | ICD-10-CM

## 2012-03-18 HISTORY — DX: Unspecified glaucoma: H40.9

## 2012-03-18 HISTORY — DX: Essential (primary) hypertension: I10

## 2012-03-18 HISTORY — DX: Pneumonia, unspecified organism: J18.9

## 2012-03-18 HISTORY — DX: Cardiac murmur, unspecified: R01.1

## 2012-03-18 LAB — URINALYSIS, MICROSCOPIC ONLY
Bilirubin Urine: NEGATIVE
Glucose, UA: NEGATIVE mg/dL
Hgb urine dipstick: NEGATIVE
Ketones, ur: NEGATIVE mg/dL
Protein, ur: >300 mg/dL — AB

## 2012-03-18 LAB — CBC WITH DIFFERENTIAL/PLATELET
Basophils Absolute: 0 10*3/uL (ref 0.0–0.1)
Basophils Relative: 0 % (ref 0–1)
Eosinophils Absolute: 0.1 10*3/uL (ref 0.0–0.7)
Eosinophils Relative: 1 % (ref 0–5)
HCT: 28.8 % — ABNORMAL LOW (ref 39.0–52.0)
MCH: 28.4 pg (ref 26.0–34.0)
MCHC: 34 g/dL (ref 30.0–36.0)
MCV: 83.5 fL (ref 78.0–100.0)
Monocytes Absolute: 0.7 10*3/uL (ref 0.1–1.0)
Neutro Abs: 5.4 10*3/uL (ref 1.7–7.7)
RDW: 15.9 % — ABNORMAL HIGH (ref 11.5–15.5)

## 2012-03-18 LAB — POCT I-STAT 3, VENOUS BLOOD GAS (G3P V)
pCO2, Ven: 42.4 mmHg — ABNORMAL LOW (ref 45.0–50.0)
pO2, Ven: 33 mmHg (ref 30.0–45.0)

## 2012-03-18 LAB — CBC
HCT: 27.7 % — ABNORMAL LOW (ref 39.0–52.0)
Hemoglobin: 9.6 g/dL — ABNORMAL LOW (ref 13.0–17.0)
MCH: 29.2 pg (ref 26.0–34.0)
MCHC: 34.7 g/dL (ref 30.0–36.0)
MCV: 84.2 fL (ref 78.0–100.0)
RBC: 3.29 MIL/uL — ABNORMAL LOW (ref 4.22–5.81)

## 2012-03-18 LAB — SODIUM, URINE, RANDOM: Sodium, Ur: 53 mEq/L

## 2012-03-18 LAB — COMPREHENSIVE METABOLIC PANEL
ALT: 8 U/L (ref 0–53)
Alkaline Phosphatase: 63 U/L (ref 39–117)
CO2: 18 mEq/L — ABNORMAL LOW (ref 19–32)
GFR calc Af Amer: 4 mL/min — ABNORMAL LOW (ref >90)
Glucose, Bld: 194 mg/dL — ABNORMAL HIGH (ref 70–99)
Potassium: 5.1 mEq/L (ref 3.5–5.1)
Sodium: 131 mEq/L — ABNORMAL LOW (ref 135–145)
Total Protein: 7.8 g/dL (ref 6.0–8.3)

## 2012-03-18 LAB — GLUCOSE, CAPILLARY
Glucose-Capillary: 140 mg/dL — ABNORMAL HIGH (ref 70–99)
Glucose-Capillary: 200 mg/dL — ABNORMAL HIGH (ref 70–99)

## 2012-03-18 LAB — MAGNESIUM: Magnesium: 2.5 mg/dL (ref 1.5–2.5)

## 2012-03-18 LAB — MRSA PCR SCREENING: MRSA by PCR: NEGATIVE

## 2012-03-18 LAB — IRON AND TIBC: UIBC: 212 ug/dL (ref 125–400)

## 2012-03-18 LAB — CREATININE, URINE, RANDOM: Creatinine, Urine: 122.19 mg/dL

## 2012-03-18 LAB — FERRITIN: Ferritin: 146 ng/mL (ref 22–322)

## 2012-03-18 MED ORDER — ASPIRIN EC 81 MG PO TBEC
81.0000 mg | DELAYED_RELEASE_TABLET | Freq: Every day | ORAL | Status: DC
  Administered 2012-03-21 – 2012-03-24 (×4): 81 mg via ORAL
  Filled 2012-03-18 (×6): qty 1

## 2012-03-18 MED ORDER — ACETAMINOPHEN 500 MG PO TABS
500.0000 mg | ORAL_TABLET | Freq: Four times a day (QID) | ORAL | Status: DC | PRN
  Filled 2012-03-18 (×2): qty 1

## 2012-03-18 MED ORDER — HEPARIN SODIUM (PORCINE) 5000 UNIT/ML IJ SOLN
5000.0000 [IU] | Freq: Three times a day (TID) | INTRAMUSCULAR | Status: DC
  Administered 2012-03-18 – 2012-03-19 (×3): 5000 [IU] via SUBCUTANEOUS
  Filled 2012-03-18 (×6): qty 1

## 2012-03-18 MED ORDER — DARBEPOETIN ALFA-POLYSORBATE 60 MCG/0.3ML IJ SOLN
60.0000 ug | INTRAMUSCULAR | Status: DC
  Administered 2012-03-19: 60 ug via INTRAVENOUS
  Filled 2012-03-18: qty 0.3

## 2012-03-18 MED ORDER — ALBUTEROL SULFATE (5 MG/ML) 0.5% IN NEBU
INHALATION_SOLUTION | RESPIRATORY_TRACT | Status: AC
  Filled 2012-03-18: qty 0.5

## 2012-03-18 MED ORDER — DOXAZOSIN MESYLATE 4 MG PO TABS
4.0000 mg | ORAL_TABLET | Freq: Every day | ORAL | Status: DC
  Administered 2012-03-18 – 2012-03-23 (×6): 4 mg via ORAL
  Filled 2012-03-18 (×8): qty 1

## 2012-03-18 MED ORDER — ALBUTEROL SULFATE (5 MG/ML) 0.5% IN NEBU
5.0000 mg | INHALATION_SOLUTION | Freq: Once | RESPIRATORY_TRACT | Status: AC
  Administered 2012-03-18: 5 mg via RESPIRATORY_TRACT

## 2012-03-18 MED ORDER — IPRATROPIUM BROMIDE 0.02 % IN SOLN
0.5000 mg | Freq: Once | RESPIRATORY_TRACT | Status: AC
  Administered 2012-03-18: 0.5 mg via RESPIRATORY_TRACT
  Filled 2012-03-18: qty 2.5

## 2012-03-18 MED ORDER — SODIUM CHLORIDE 0.9 % IJ SOLN: 3.0000 mL | INTRAMUSCULAR | Status: DC | PRN

## 2012-03-18 MED ORDER — FUROSEMIDE 10 MG/ML IJ SOLN
160.0000 mg | Freq: Three times a day (TID) | INTRAVENOUS | Status: DC
  Administered 2012-03-18: 160 mg via INTRAVENOUS
  Filled 2012-03-18 (×3): qty 16

## 2012-03-18 MED ORDER — INSULIN ASPART 100 UNIT/ML ~~LOC~~ SOLN
0.0000 [IU] | Freq: Three times a day (TID) | SUBCUTANEOUS | Status: DC
  Administered 2012-03-18: 1 [IU] via SUBCUTANEOUS
  Administered 2012-03-19: 2 [IU] via SUBCUTANEOUS
  Administered 2012-03-20: 3 [IU] via SUBCUTANEOUS
  Administered 2012-03-21: 2 [IU] via SUBCUTANEOUS
  Administered 2012-03-21: 1 [IU] via SUBCUTANEOUS
  Administered 2012-03-22 – 2012-03-23 (×3): 2 [IU] via SUBCUTANEOUS
  Administered 2012-03-23: 3 [IU] via SUBCUTANEOUS

## 2012-03-18 MED ORDER — RENA-VITE PO TABS
1.0000 | ORAL_TABLET | Freq: Every day | ORAL | Status: DC
  Administered 2012-03-18: 16:00:00 via ORAL
  Administered 2012-03-21 – 2012-03-23 (×3): 1 via ORAL
  Filled 2012-03-18 (×6): qty 1

## 2012-03-18 MED ORDER — ALBUTEROL SULFATE (5 MG/ML) 0.5% IN NEBU
5.0000 mg | INHALATION_SOLUTION | Freq: Once | RESPIRATORY_TRACT | Status: AC
  Administered 2012-03-18: 5 mg via RESPIRATORY_TRACT
  Filled 2012-03-18: qty 40
  Filled 2012-03-18: qty 1

## 2012-03-18 MED ORDER — LEVOTHYROXINE SODIUM 175 MCG PO TABS
175.0000 ug | ORAL_TABLET | Freq: Every day | ORAL | Status: DC
  Administered 2012-03-19 – 2012-03-24 (×6): 175 ug via ORAL
  Filled 2012-03-18 (×8): qty 1

## 2012-03-18 MED ORDER — KIDNEY FAILURE BOOK
Freq: Once | Status: AC
  Administered 2012-03-18: 18:00:00
  Filled 2012-03-18: qty 1

## 2012-03-18 MED ORDER — DEXTROSE 5 % IV SOLN
1.5000 g | INTRAVENOUS | Status: AC
  Administered 2012-03-19: 1.5 g via INTRAVENOUS
  Filled 2012-03-18: qty 1.5

## 2012-03-18 MED ORDER — SODIUM CHLORIDE 0.9 % IJ SOLN
3.0000 mL | Freq: Two times a day (BID) | INTRAMUSCULAR | Status: DC
  Administered 2012-03-18 – 2012-03-19 (×2): 3 mL via INTRAVENOUS

## 2012-03-18 MED ORDER — SODIUM CHLORIDE 0.9 % IV SOLN
250.0000 mL | INTRAVENOUS | Status: DC | PRN
  Administered 2012-03-19: 10:00:00 via INTRAVENOUS

## 2012-03-18 MED ORDER — FUROSEMIDE 10 MG/ML IJ SOLN
160.0000 mg | Freq: Four times a day (QID) | INTRAVENOUS | Status: AC
  Administered 2012-03-19 (×2): 160 mg via INTRAVENOUS
  Filled 2012-03-18 (×5): qty 16

## 2012-03-18 MED ORDER — IPRATROPIUM BROMIDE 0.02 % IN SOLN
0.5000 mg | Freq: Once | RESPIRATORY_TRACT | Status: AC
  Administered 2012-03-18: 0.5 mg via RESPIRATORY_TRACT
  Filled 2012-03-18 (×2): qty 2.5

## 2012-03-18 MED ORDER — HYDRALAZINE HCL 25 MG PO TABS
25.0000 mg | ORAL_TABLET | Freq: Three times a day (TID) | ORAL | Status: DC
  Administered 2012-03-18 – 2012-03-19 (×3): 25 mg via ORAL
  Filled 2012-03-18 (×9): qty 1

## 2012-03-18 MED ORDER — CALCIUM ACETATE 667 MG PO CAPS
667.0000 mg | ORAL_CAPSULE | Freq: Three times a day (TID) | ORAL | Status: DC
  Administered 2012-03-18 – 2012-03-24 (×11): 667 mg via ORAL
  Filled 2012-03-18 (×20): qty 1

## 2012-03-18 MED ORDER — SODIUM CHLORIDE 0.9 % IJ SOLN
3.0000 mL | Freq: Two times a day (BID) | INTRAMUSCULAR | Status: DC
  Administered 2012-03-18: 3 mL via INTRAVENOUS

## 2012-03-18 NOTE — Progress Notes (Signed)
Rt called by RR for Bipap due to increased WOB. Albuterol neb given by RN upon arrival. Rt placed on BIPAP 10/5 40%. VT 600-800. rr 20-26. SPO2 98%. Pt states he is feeling better. RT will transport to step down and continue to monitor.

## 2012-03-18 NOTE — Progress Notes (Signed)
Pt was weaned down to 5 LPM [place] per verbal order NP K. Schorr to wean pt off BiPAP. Pt vitals are stable HR 55 RR 16 and pt maintaining satuation of 100%. Will continue to monitor pt and continue to wean as tolerated.

## 2012-03-18 NOTE — Consult Note (Signed)
[facility] - CONSULT NOTE Resident Note    Please see below for attending addendum to resident note.   Date: 03/18/2012                  Patient Name:  Jack [name]  MRN: [id number]  DOB: [birth year]  Age / Sex: 67 y.o., male         PCP: No primary provider on file.                 Referring Physician: ED physician                 Reason for Consult: Acute on chronic RF            History of Present Illness: Patient is a 67 y.o. male with a PMHx of DM, HTN, CKD, MGUS and self reported thyroid removal, who was admitted to [facility] on 03/18/2012 for evaluation of shortness of breath. Patient complaints of shortness of breath for two weeks with exertion. He endorses orthopnea, difficulty speaking in full sentences, decrease urine output, swelling to his abdomen and bilateral lower extremities. He says that he has a history of renal disease because of his diabetes which he takes lasix for but it has not been working because he does not make urine as he was before.   Denies nausea, vomiting, diarrhea or decreased oral intake. Denies OTC NSAIDs use.   He is followed by Nephrologist Dr. Georgia Dom. He is referred to Cardiologist Dr. Richardean Sale" 1-2 weeks ago and had echo done. He was seen by Oncologist Dr. Zella Ball in 2013 for MGUS. He had a bone marrow biopsy done one year ago.  His PCP is Dr. Doreene Burke.  Review of Systems:  Constitutional:  Denies fever, chills, diaphoresis, appetite change and fatigue.   HEENT:  Denies congestion, sore throat, rhinorrhea, sneezing, mouth sores, trouble swallowing, neck pain   Respiratory:  positive SOB, DOE, orthopnea, PND   Cardiovascular:  Positive for abdominal and leg swelling.   Gastrointestinal:  Denies nausea, vomiting, abdominal pain, diarrhea, constipation, blood in stool and abdominal distention.   Genitourinary:  Denies dysuria, urgency, frequency, hematuria, flank pain and difficulty urinating. Positive for decreased UOP   Musculoskeletal:  Denies myalgias, back pain, joint swelling, arthralgias and gait problem.   Skin:  Denies pallor, rash and wound.   Neurological:  Denies dizziness, seizures, syncope, weakness, light-headedness, numbness and headaches.    .    Medications: Outpatient medications:  (Not in a hospital admission)  Current medications: Current Facility-Administered Medications  Medication Dose Route Frequency Provider Last Rate Last Dose  . furosemide (LASIX) 160 mg in dextrose 5 % 50 mL IVPB  160 mg Intravenous Q6H Dede Query, MD       Current Outpatient Prescriptions  Medication Sig Dispense Refill  . amLODipine (NORVASC) 10 MG tablet Take 10 mg by mouth daily.      Marland Kitchen aspirin EC 81 MG tablet Take 81 mg by mouth daily.      . calcitRIOL (ROCALTROL) 0.25 MCG capsule Take 0.25 mcg by mouth daily.      . cloNIDine (CATAPRES) 0.2 MG tablet Take 0.2 mg by mouth 2 (two) times daily.      Marland Kitchen doxazosin (CARDURA) 4 MG tablet Take 4 mg by mouth at bedtime.       . furosemide (LASIX) 80 MG tablet Take 120 mg by mouth 2 (two) times daily.      . hydrALAZINE (APRESOLINE) 25 MG tablet Take 25  mg by mouth 3 (three) times daily.      Marland Kitchen levothyroxine (SYNTHROID, LEVOTHROID) 175 MCG tablet Take 175 mcg by mouth daily.      . nebivolol (BYSTOLIC) 10 MG tablet Take 10 mg by mouth daily.      . saxagliptin HCl (ONGLYZA) 5 MG TABS tablet Take 5 mg by mouth daily.      Marland Kitchen spironolactone (ALDACTONE) 25 MG tablet Take 25 mg by mouth daily.          Allergies: Allergies  Allergen Reactions  . Ivp Dye (Iodinated Diagnostic Agents) Hives  . Morphine And Related     Bradycardia states patient      Past Medical History: Past Medical History  Diagnosis Date  . Renal disorder   . Hypertension   . Diabetes mellitus without complication   MGUS vs Myeloma Sec HPTH Anemia  Past Surgical History: History reviewed. No pertinent past surgical history.   Family History: No family history on  file.   Social History:  reports that he has never smoked. He does not have any smokeless tobacco history on file. He reports that  drinks alcohol. He reports that he does not use illicit drugs.   Review of Systems: As per HPI  Vital Signs: Blood pressure 123/61, pulse 47, temperature 98.2 F (36.8 C), temperature source Oral, resp. rate 14, SpO2 100.00%.  Weight trends: There were no vitals filed for this visit.  Physical Exam: General: Vital signs reviewed and noted. Well developed. No acute distress ; alert, appropriate and cooperative throughout examination.  Head: Normocephalic, atraumatic.  Eyes: PERRL, EOMI, No signs of anemia or jaundince.  Nose: Mucous membranes moist, not inflammed, nonerythematous.  Throat: Oropharynx nonerythematous, no exudate appreciated.   Neck: No deformities, masses, or tenderness noted.Supple, No carotid Bruits, ++ JVD 11.  Lungs:  Normal respiratory effort.  Bibasilar lungs crackles noted. No wheezes.  Heart: RRR. S1 and S2 normal without gallop, murmur, or rubs.  Abdomen:  BS normoactive. Soft, non-tender.  No masses or organomegaly. Abdominal wall edema noted. + distend   Extremities: Pitting edema 3+ up to thigh level.   Neurologic: A&O X3, CN II - XII are grossly intact. Motor strength is 5/5 in the all 4 extremities, Sensations intact to light touch, Cerebellar signs negative.  Skin: No visible rashes, scars.    Lab results: Basic Metabolic Panel:  Recent Labs Lab 03/18/12 0940  NA 131*  K 5.1  CL 95*  CO2 18*  GLUCOSE 194*  BUN 91*  CREATININE 13.39*  CALCIUM 9.0    Liver Function Tests:  Recent Labs Lab 03/18/12 0940  AST 8  ALT 8  ALKPHOS 63  BILITOT 0.4  PROT 7.8  ALBUMIN 3.1*   No results found for this basename: LIPASE, AMYLASE,  in the last 168 hours No results found for this basename: AMMONIA,  in the last 168 hours  CBC:  Recent Labs Lab 03/18/12 0940  WBC 6.8  NEUTROABS 5.4  HGB 9.8*  HCT 28.8*   MCV 83.5  PLT 170    Cardiac Enzymes: No results found for this basename: CKTOTAL, CKMB, CKMBINDEX, TROPONINI,  in the last 168 hours  BNP: No components found with this basename: POCBNP,   CBG: No results found for this basename: GLUCAP,  in the last 168 hours  Microbiology: Results for orders placed during the hospital encounter of 07/18/09  URINE CULTURE     Status: None   Collection Time    07/18/09 12:32 PM  Result Value Range Status   Specimen Description URINE, RANDOM   Final   Special Requests NONE   Final   Colony Count NO GROWTH   Final   Culture NO GROWTH   Final   Report Status 07/19/2009 FINAL   Final  CULTURE, BLOOD (ROUTINE X 2)     Status: None   Collection Time    07/18/09  1:02 PM      Result Value Range Status   Specimen Description BLOOD LEFT ARM   Final   Special Requests BOTTLES DRAWN AEROBIC ONLY 10CC   Final   Culture NO GROWTH 5 DAYS   Final   Report Status 07/24/2009 FINAL   Final  CULTURE, BLOOD (ROUTINE X 2)     Status: None   Collection Time    07/18/09  1:20 PM      Result Value Range Status   Specimen Description BLOOD LEFT HAND   Final   Special Requests BOTTLES DRAWN AEROBIC ONLY 10CC   Final   Culture NO GROWTH 5 DAYS   Final   Report Status 07/24/2009 FINAL   Final    Coagulation Studies: No results found for this basename: LABPROT, INR,  in the last 72 hours  Urinalysis: No results found for this basename: COLORURINE, APPERANCEUR, LABSPEC, PHURINE, GLUCOSEU, HGBUR, BILIRUBINUR, KETONESUR, PROTEINUR, UROBILINOGEN, NITRITE, LEUKOCYTESUR,  in the last 72 hours    Imaging: Dg Chest Port 1 View  03/18/2012  *RADIOLOGY REPORT*  Clinical Data: Exertional shortness of breath, swelling in legs and feet  PORTABLE CHEST - 1 VIEW  Comparison: 07/18/2009  Findings: Cardiomegaly.  Pulmonary vascular congestion without frank interstitial edema. No pleural effusion or pneumothorax.  Stable elevation of the right hemidiaphragm with associated  right basilar atelectasis.  IMPRESSION: Cardiomegaly with pulmonary vascular congestion.  No frank interstitial edema.   Original Report Authenticated By: Charline Bills, M.D.      Assessment & Plan: Patient is a 67 y.o. male with a PMHx of DM, HTN, CKD, MGUS and self reported thyroid removal, who was admitted to [facility] on 03/18/2012 for evaluation of shortness of breath. Renal is consulted for A/C RF.  History of MGUS followed by oncology Dr. Zella Ball with last OV 02/28/11--" he has an evolving myeloma vs active disease causing renal dysfunction. I have proposed that we recheck labs in 2 months and begin aranesp".   History of CKD followed by Dr. Georgia Dom. Baseline BNU 22 and Cr 3.29 with GRF 29 in Dec 2013.   Renal artery doppler 02/08/11-- unremarkable study  1. Acute on chronic renal failure in the setting of CKD IV  Etiology is unclear. Will need to obtain most recent office records.   - UA , Random U sodium and Cr - Renal function test daily - Lasix 160 IV every 6 - Foley catheter - Renal U/S - No indication for urgent HD yet ( No hyperkalemia, acid-base imbalance, pulmonary edema or uremia) Will need HD after catheter placed  2. Fluid overload Likely associated with A/C RF. Pertinent history include proteinuria (Ucr 188.7 and protein 233 in July 2013) and MGUS.  No history of liver disease or cirrhosis..  No history of CHF, but was referred to cardiologist recently for echo and EKG.  Will obtain office records.  Reports baseline weight of 202 lbs.   - ProBNP > 12,000 likely associated with his ARF and fluid overload.  - CXR showed " pulmonary vascular congestion. No pulmonary edema." - Lasix 160 q 6 - No indication  for urgent HD ( no pulmonary edema) - HD if no response to lasix or worsening his s/s.   3. Hyponatremia, likely associate with fluid overload-->hypervolemic hyponatremia  - Lasix  4. Anemia, baseline at 11 - may be associated with volume overload - trend  CBC - Obtain office records of anemia work up.  DVT PPX - recs Heparin--defer to primary care team.   Patient history and plan of care reviewed with attending, Dr. Briant Cedar.   Dede Query, MD  PGYII, Internal Medicine Resident 03/18/2012, 1:17 PM I have seen and examined this patient and agree with plan per Dr Ian Bushman.  67 yo BM with CKD 4 and ? Myeloma admitted for acute worsening of edema and increased SOB over last few weeks.  Appetite has been decreased.  No new meds. 2 months age Scr 3.7 and now 67.  I wonder if he has had progression of this ? Myeloma.  Will check immunofixation etc and ask Hematology to see.  He will need perm cath placed in AM and will start HD.  He has been to kidney option class.  Labs ordered. Will clip for [facility]. MATTINGLY,MICHAEL T,MD 03/18/2012 2:09 PM

## 2012-03-18 NOTE — Progress Notes (Signed)
RT note: Bipap on hold per MD. RT to continue to monitor.

## 2012-03-18 NOTE — Consult Note (Signed)
[facility] [place] CONSULT NOTE 03/18/2012 DOB: 02/27/1945 MRN : [id number]  CC: ESRD Referring Physician: Dr Mattingly He is followed by Nephrologist Dr. Coladonatto.  History of Present Illness: Jack [name] is a 66 y.o. male with PMH significant for CKD last cr at 3.2, MGUS , Diabetes, who presents to ED complaining of worsening shortness of breath for last 2 weeks prior to admission. He relates dyspnea at rest and on exertion. Describe orthopnea. He denies chest pain, nausea, vomiting, diarrhea. 2 months age Scr 3.7 and now 11. We were asked to place IDC catheter and evaluate for perm access. Pt is RHD and is not yet on HD   Past Medical History  Diagnosis Date  . Renal disorder   . Hypertension   . Diabetes mellitus without complication   . Shortness of breath   . Pneumonia     2012  . Heart murmur   . Glaucoma     Past Surgical History  Procedure Laterality Date  . Thyroidectomy    . Cervical disc surgery    . Eye surgery      CATARACTS     ROS: [x] Positive  [ ] Denies    General: [ ] Weight loss, [ ] Fever, [ ] chills Neurologic: [ ] Dizziness, [ ] Blackouts, [ ] Seizure [ ] Stroke, [ ] "Mini stroke", [ ] Slurred speech, [ ] Temporary blindness; [ ] weakness in arms or legs, [ ] Hoarseness Cardiac: [ ] Chest pain/pressure, [x ] Shortness of breath at rest [ x] Shortness of breath with exertion, [ ] Atrial fibrillation or irregular heartbeat Vascular: [ ] Pain in legs with walking, [ ] Pain in legs at rest, [ ] Pain in legs at night,  [ ] Non-healing ulcer, [ ] Blood clot in vein/DVT,   Pulmonary: [ ] Home oxygen, [ ] Productive cough, [ ] Coughing up blood, [ ] Asthma,  [ ] Wheezing Musculoskeletal:  [ ] Arthritis, [ ] Low back pain, [ ] Joint pain Hematologic: [ ] Easy Bruising, [x ] Anemia; [ ] Hepatitis Gastrointestinal: [ ] Blood in stool, [ ] Gastroesophageal Reflux/heartburn, [ ] Trouble swallowing Urinary: [ x] chronic Kidney  disease, [ ] on HD - [ ] MWF or [ ] TTHS, [ ] Burning with urination, [ ] Difficulty urinating Skin: [ ] Rashes, [ ] Wounds Psychological: [ ] Anxiety, [ ] Depression  Social History History  Substance Use Topics  . Smoking status: Former Smoker    Quit date: 03/19/1982  . Smokeless tobacco: Never Used  . Alcohol Use: Yes     Comment: daily    Family History History reviewed. No pertinent family history.  Allergies  Allergen Reactions  . Ivp Dye (Iodinated Diagnostic Agents) Hives  . Morphine And Related     Bradycardia states patient    Current Facility-Administered Medications  Medication Dose Route Frequency Alyze Lauf Last Rate Last Dose  . 0.9 %  sodium chloride infusion  250 mL Intravenous PRN Belkys A Regalado, MD      . [START ON 03/19/2012] aspirin EC tablet 81 mg  81 mg Oral Daily Belkys A Regalado, MD      . calcium acetate (PHOSLO) capsule 667 mg  667 mg Oral TID WC Michael T Mattingly, MD      . [START ON 03/19/2012] darbepoetin (ARANESP) injection 60 mcg  60 mcg Intravenous Q Thu-HD Michael T Mattingly, MD      . doxazosin (  CARDURA) tablet 4 mg  4 mg Oral QHS Belkys A Regalado, MD      . furosemide (LASIX) 160 mg in dextrose 5 % 50 mL IVPB  160 mg Intravenous Q6H Na Li, MD      . heparin injection 5,000 Units  5,000 Units Subcutaneous Q8H Belkys A Regalado, MD      . hydrALAZINE (APRESOLINE) tablet 25 mg  25 mg Oral TID Belkys A Regalado, MD      . insulin aspart (novoLOG) injection 0-9 Units  0-9 Units Subcutaneous TID WC Belkys A Regalado, MD      . [START ON 03/19/2012] levothyroxine (SYNTHROID, LEVOTHROID) tablet 175 mcg  175 mcg Oral QAC breakfast Belkys A Regalado, MD      . multivitamin (RENA-VIT) tablet 1 tablet  1 tablet Oral Daily Michael T Mattingly, MD      . sodium chloride 0.9 % injection 3 mL  3 mL Intravenous Q12H Belkys A Regalado, MD      . sodium chloride 0.9 % injection 3 mL  3 mL Intravenous Q12H Belkys A Regalado, MD      . sodium chloride 0.9 %  injection 3 mL  3 mL Intravenous PRN Belkys A Regalado, MD         Imaging: Us Renal  03/18/2012  *RADIOLOGY REPORT*  Clinical Data: Acute renal failure  RENAL/URINARY TRACT ULTRASOUND COMPLETE  Comparison:  None.  Findings:  Right Kidney:  Measures 12.0 cm.  Echogenic renal parenchyma, suggesting medical renal disease.  No mass or hydronephrosis.  Left Kidney:  Measures 10.7 cm.  Echogenic renal parenchyma, suggesting medical renal disease.  9 x 7 x 7 mm interpolar cyst. No hydronephrosis.  Bladder:  Decompressed by indwelling Foley catheter.  IMPRESSION: No hydronephrosis.  Echogenic renal parenchyma, suggesting medical renal disease.   Original Report Authenticated By: Sriyesh Krishnan, M.D.    Dg Chest Port 1 View  03/18/2012  *RADIOLOGY REPORT*  Clinical Data: Exertional shortness of breath, swelling in legs and feet  PORTABLE CHEST - 1 VIEW  Comparison: 07/18/2009  Findings: Cardiomegaly.  Pulmonary vascular congestion without frank interstitial edema. No pleural effusion or pneumothorax.  Stable elevation of the right hemidiaphragm with associated right basilar atelectasis.  IMPRESSION: Cardiomegaly with pulmonary vascular congestion.  No frank interstitial edema.   Original Report Authenticated By: Sriyesh Krishnan, M.D.     Significant Diagnostic Studies: CBC Lab Results  Component Value Date   WBC 6.8 03/18/2012   HGB 9.8* 03/18/2012   HCT 28.8* 03/18/2012   MCV 83.5 03/18/2012   PLT 170 03/18/2012    BMET    Component Value Date/Time   NA 131* 03/18/2012 0940   K 5.1 03/18/2012 0940   CL 95* 03/18/2012 0940   CO2 18* 03/18/2012 0940   GLUCOSE 194* 03/18/2012 0940   BUN 91* 03/18/2012 0940   CREATININE 13.39* 03/18/2012 0940   CALCIUM 9.0 03/18/2012 0940   GFRNONAA 3* 03/18/2012 0940   GFRAA 4* 03/18/2012 0940    COAG No results found for this basename: INR, PROTIME   No results found for this basename: PTT     Physical Examination BP Readings from Last 3 Encounters:  03/18/12 133/55   02/28/11 173/78  02/13/11 182/91   Temp Readings from Last 3 Encounters:  03/18/12 97.9 F (36.6 C) Oral  02/28/11 98.2 F (36.8 C) Oral  02/13/11 98.3 F (36.8 C) Oral   SpO2 Readings from Last 3 Encounters:  03/18/12 95%   Pulse Readings from Last   3 Encounters:  03/18/12 70  02/28/11 78  02/13/11 82    General:  WDWN in NAD Gait: Normal HENT: WNL Eyes: Pupils equal Pulmonary: normal non-labored breathing  Cardiac: RRR, without  Murmurs, rubs or gallops; No carotid bruits Abdomen: soft, NT, no masses Skin: no rashes, ulcers noted Vascular Exam/Pulses:2+ radial pulses palp bilat 1+ palp ulnar pulses Both hands warm with good grip Extremities without ischemic changes, no Gangrene , no cellulitis; no open wounds;  Musculoskeletal: no muscle wasting or atrophy  Neurologic: A&O X 3; Appropriate Affect ;  SENSATION: normal; MOTOR FUNCTION: Pt has good and equal strength in all extremities - 5/5 Speech is fluent/normal  Non-Invasive Vascular Imaging: vein mapping pending   ASSESSMENT/PLAN: Jack [name] is a 66 y.o. male with CKD who has progressed to sCR 13 from 3. We will place an IDC catheter tomorrow Save Left arm for AVF Vein mapping for perm access ordered - depending on result will proceed with AVF vs AVGG  Agree with above-plan hemodialysis catheter insertion on Thursday and evaluate for aVF 

## 2012-03-18 NOTE — ED Provider Notes (Signed)
History     CSN: [id number]  Arrival date & time 03/18/12  0911   First MD Initiated Contact with Patient 03/18/12 [phone]      Chief Complaint  Patient presents with  . Shortness of Breath    (Consider location/radiation/quality/duration/timing/severity/associated sxs/prior treatment) HPI  Pt to the ED with complaints of shortness of breath for two weeks with exertion. He endorses orthopnea, difficulty speaking in full sentences, decrease urine output, swelling to his abdomen and bilateral lower extremities. He says that he has a history of renal disease because of his diabetes which he takes lasix for but it has not been working because he does not make urine as he was before. He had a bone marrow biopsy done but he is unsure of why he needed it. He denies having chest pains, fevers, chills, N/V/D. He had pneumonia 10 years ago and says this feels a lot like the same. PMH is positive for renal disorder, hypertension, and thryoid removal and diabetes. At this time pt is in respiratory distress with vital signs stable at this time.  Past Medical History  Diagnosis Date  . Renal disorder   . Hypertension   . Diabetes mellitus without complication   . Shortness of breath   . Pneumonia     2012  . Heart murmur   . Glaucoma     Past Surgical History  Procedure Laterality Date  . Thyroidectomy    . Cervical disc surgery    . Eye surgery      CATARACTS    History reviewed. No pertinent family history.  History  Substance Use Topics  . Smoking status: Former Smoker    Quit date: 03/19/1982  . Smokeless tobacco: Never Used  . Alcohol Use: Yes     Comment: daily      Review of Systems  All other systems reviewed and are negative.    Allergies  Ivp dye and Morphine and related  Home Medications   No current outpatient prescriptions on file.  BP 133/55  Pulse 70  Temp(Src) 97.9 F (36.6 C) (Oral)  Resp 20  Ht 5\' 10"  (1.778 m)  Wt 221 lb 9 oz (100.5 kg)  BMI  31.79 kg/m2  SpO2 95%  Physical Exam  Nursing note and vitals reviewed. Constitutional: He appears well-developed and well-nourished. He appears distressed.  HENT:  Head: Normocephalic and atraumatic.  Eyes: Pupils are equal, round, and reactive to light.  Neck: Normal range of motion. Neck supple.  Cardiovascular: Normal rate and regular rhythm.   Pulmonary/Chest: He is in respiratory distress.  Abdominal: Soft. He exhibits distension. He exhibits no mass. There is no tenderness. There is no rebound and no guarding.  Musculoskeletal:       Right knee: He exhibits swelling. He exhibits no ecchymosis and no erythema.       Legs: + bilateral lower extremity edema.  Neurological: He is alert.  Skin: Skin is warm and dry.    ED Course  Procedures (including critical care time)  Labs Reviewed  PRO B NATRIURETIC PEPTIDE - Abnormal; Notable for the following:    Pro B Natriuretic peptide (BNP) 12648.0 (*)    All other components within normal limits  COMPREHENSIVE METABOLIC PANEL - Abnormal; Notable for the following:    Sodium 131 (*)    Chloride 95 (*)    CO2 18 (*)    Glucose, Bld 194 (*)    BUN 91 (*)    Creatinine, Ser 13.39 (*)  Albumin 3.1 (*)    GFR calc non Af Amer 3 (*)    GFR calc Af Amer 4 (*)    All other components within normal limits  CBC WITH DIFFERENTIAL - Abnormal; Notable for the following:    RBC 3.45 (*)    Hemoglobin 9.8 (*)    HCT 28.8 (*)    RDW 15.9 (*)    Neutrophils Relative 80 (*)    Lymphocytes Relative 8 (*)    Lymphs Abs 0.6 (*)    All other components within normal limits  URINALYSIS, MICROSCOPIC ONLY - Abnormal; Notable for the following:    Protein, ur >300 (*)    Leukocytes, UA TRACE (*)    Squamous Epithelial / LPF FEW (*)    All other components within normal limits  PHOSPHORUS - Abnormal; Notable for the following:    Phosphorus 7.5 (*)    All other components within normal limits  POCT I-STAT 3, BLOOD GAS (G3P V) - Abnormal;  Notable for the following:    pH, Ven 7.326 (*)    pCO2, Ven 42.4 (*)    Acid-base deficit 4.0 (*)    All other components within normal limits  CREATININE, URINE, RANDOM  SODIUM, URINE, RANDOM  MAGNESIUM  PROTEIN ELECTROPHORESIS, SERUM  KAPPA/LAMBDA LIGHT CHAINS  IMMUNOFIXATION ELECTROPHORESIS  HEPATITIS B SURFACE ANTIGEN  HEPATITIS B SURFACE ANTIBODY  HEPATITIS B CORE ANTIBODY, TOTAL  IRON AND TIBC  FERRITIN  PARATHYROID HORMONE, INTACT (NO CA)  CBC  CREATININE, SERUM  POCT I-STAT TROPONIN I   US Renal  03/18/2012  *RADIOLOGY REPORT*  Clinical Data: Acute renal failure  RENAL/URINARY TRACT ULTRASOUND COMPLETE  Comparison:  None.  Findings:  Right Kidney:  Measures 12.0 cm.  Echogenic renal parenchyma, suggesting medical renal disease.  No mass or hydronephrosis.  Left Kidney:  Measures 10.7 cm.  Echogenic renal parenchyma, suggesting medical renal disease.  9 x 7 x 7 mm interpolar cyst. No hydronephrosis.  Bladder:  Decompressed by indwelling Foley catheter.  IMPRESSION: No hydronephrosis.  Echogenic renal parenchyma, suggesting medical renal disease.   Original Report Authenticated By: Charline Bills, M.D.    Dg Chest Port 1 View  03/18/2012  *RADIOLOGY REPORT*  Clinical Data: Exertional shortness of breath, swelling in legs and feet  PORTABLE CHEST - 1 VIEW  Comparison: 07/18/2009  Findings: Cardiomegaly.  Pulmonary vascular congestion without frank interstitial edema. No pleural effusion or pneumothorax.  Stable elevation of the right hemidiaphragm with associated right basilar atelectasis.  IMPRESSION: Cardiomegaly with pulmonary vascular congestion.  No frank interstitial edema.   Original Report Authenticated By: Charline Bills, M.D.      1. Acute on chronic renal failure   2. Bradycardia   3. Diabetes   4. Essential hypertension, benign   5. MGUS (monoclonal gammopathy of unknown significance)       MDM   Date: 03/18/2012  Rate: 53  Rhythm: accelerated junctional  escape rhythm  QRS Axis: normal  Intervals: normal  ST/T Wave abnormalities: nonspecific T wave changes  Conduction Disutrbances:left anterior fascicular block  Narrative Interpretation: no peaked T-waves noted  Old EKG Reviewed: no significant changes from July 18, 2009    10;50am- patients creatinine is greater than 13 and BNP is greater than 12,500. He is sating well and tolerating 2L [place].  11:15am- spoke with Nephrology who will come see patient and assess for emergent dialysis. He advises 160mg  IV lasix q 8 hours and insert foley catheter.  VBG currently pending for admission to  Medicine. Discussed results and plans with patient.    12;59pm- Spoke with Dr. Waymon Amato who has agreed to admit patient. Holding orders placed. Inpatient, tele, team [address]  Dorthula Matas, PA-C 03/18/12 1526

## 2012-03-18 NOTE — ED Notes (Signed)
Per ems- states sob x 2 weeks, increased swelling in feet and abd. Pt comes from home. States hx of renal failure, is currently taking lasix. Denies CP. Labored breathing with exertion. BP 162/90, HR 70, 99% 3 L. Pt alert and oriented. EKG unremarkable.

## 2012-03-18 NOTE — H&P (Addendum)
[facility] History and Physical  ARNOLDO HILDRETH AVW:[id number] DOB: 02-05-1945 DOA: 03/18/2012  Referring physician: ED Physician PCP: No primary provider on file.  Specialists: Dr Briant Cedar  Chief Complaint: SOB  HPI: Jack [name] is a 67 y.o. male with PMH significant for CKD last cr at 3.2, MGUS , Diabetes, who presents to ED complaining of worsening shortness of breath for last 2 weeks prior to admission. He relates dyspnea at rest and on exertion. Describe orthopnea. He denies chest pain, nausea, vomiting, diarrhea. He saw Dr Donnie Coffin last year and was told that he didn't have multiple myeloma.    Review of Systems: The patient denies anorexia, fever, weight loss,, vision loss, decreased hearing, hoarseness, chest pain, syncope, hemoptysis, abdominal pain, melena, hematochezia, severe indigestion/heartburn, hematuria, incontinence, genital sores, muscle weakness, suspicious skin lesions, transient blindness, difficulty walking, depression, unusual weight change.  Past Medical History  Diagnosis Date  . Renal disorder   . Hypertension   . Diabetes mellitus without complication    History reviewed. No pertinent past surgical history. Social History:  reports that he has never smoked. He does not have any smokeless tobacco history on file. He reports that  drinks alcohol. He reports that he does not use illicit drugs.Lives at home with Daughter. He was Engineer, site, music, work also for [employer]. Currently not working. He is divorced.    Allergies  Allergen Reactions  . Ivp Dye (Iodinated Diagnostic Agents) Hives  . Morphine And Related     Bradycardia states patient    Family History; father history of renal failure.   Prior to Admission medications   Medication Sig Start Date End Date Taking? Authorizing Provider  amLODipine (NORVASC) 10 MG tablet Take 10 mg by mouth daily.   Yes Historical Provider, MD  aspirin EC 81 MG tablet Take 81 mg by mouth  daily.   Yes Historical Provider, MD  calcitRIOL (ROCALTROL) 0.25 MCG capsule Take 0.25 mcg by mouth daily.   Yes Historical Provider, MD  cloNIDine (CATAPRES) 0.2 MG tablet Take 0.2 mg by mouth 2 (two) times daily.   Yes Historical Provider, MD  doxazosin (CARDURA) 4 MG tablet Take 4 mg by mouth at bedtime.    Yes Historical Provider, MD  furosemide (LASIX) 80 MG tablet Take 120 mg by mouth 2 (two) times daily.   Yes Historical Provider, MD  hydrALAZINE (APRESOLINE) 25 MG tablet Take 25 mg by mouth 3 (three) times daily.   Yes Historical Provider, MD  levothyroxine (SYNTHROID, LEVOTHROID) 175 MCG tablet Take 175 mcg by mouth daily.   Yes Historical Provider, MD  nebivolol (BYSTOLIC) 10 MG tablet Take 10 mg by mouth daily.   Yes Historical Provider, MD  saxagliptin HCl (ONGLYZA) 5 MG TABS tablet Take 5 mg by mouth daily.   Yes Historical Provider, MD  spironolactone (ALDACTONE) 25 MG tablet Take 25 mg by mouth daily.   Yes Historical Provider, MD   Physical Exam: Filed Vitals:   03/18/12 1200 03/18/12 1215 03/18/12 1230 03/18/12 1245  BP: 131/67 142/70 141/64 143/63  Pulse: 44 50 48 52  Temp:      TempSrc:      Resp: 13 19 21 25   SpO2: 100% 100% 100% 99%   General Appearance:    Alert, cooperative, no distress, appears stated age  Head:    Normocephalic, without obvious abnormality, atraumatic  Eyes:    PERRL, conjunctiva/corneas clear, EOM's intact,          Ears:  Normal TM's and external ear canals, both ears  Nose:   Nares normal, septum midline, mucosa normal, no drainage    or sinus tenderness  Throat:   Lips, mucosa, and tongue normal; teeth and gums normal  Neck:   Supple, symmetrical, trachea midline, no adenopathy;       thyroid:  No enlargement/tenderness/nodules; no carotid   Bruit. Positive  JVD  Back:     Symmetric, no curvature, ROM normal, no CVA tenderness  Lungs:     Bilateral crackles.   Chest wall:    No tenderness or deformity  Heart:    Regular rate and  rhythm, S1 and S2 normal, no murmur, rub   or gallop  Abdomen:     Soft, non-tender, bowel sounds active all four quadrants,    Distended, no rigidity.        Extremities:   Extremities normal, atraumatic, bilateral LE edema plus 3 up to thigh.  Pulses:   2+ and symmetric all extremities  Skin:   Skin color, texture, turgor normal, no rashes or lesions  Lymph nodes:   Cervical, supraclavicular, and axillary nodes normal  Neurologic:   CNII-XII intact. Normal strength, sensation and reflexes      throughout    Labs on Admission:  Basic Metabolic Panel:  Recent Labs Lab 03/18/12 0940  NA 131*  K 5.1  CL 95*  CO2 18*  GLUCOSE 194*  BUN 91*  CREATININE 13.39*  CALCIUM 9.0  MG 2.5  PHOS 7.5*   Liver Function Tests:  Recent Labs Lab 03/18/12 0940  AST 8  ALT 8  ALKPHOS 63  BILITOT 0.4  PROT 7.8  ALBUMIN 3.1*   CBC:  Recent Labs Lab 03/18/12 0940  WBC 6.8  NEUTROABS 5.4  HGB 9.8*  HCT 28.8*  MCV 83.5  PLT 170   Cardiac Enzymes: No results found for this basename: CKTOTAL, CKMB, CKMBINDEX, TROPONINI,  in the last 168 hours  BNP (last 3 results)  Recent Labs  03/18/12 0940  PROBNP 12648.0*   CBG: No results found for this basename: GLUCAP,  in the last 168 hours  Radiological Exams on Admission: US Renal  03/18/2012  *RADIOLOGY REPORT*  Clinical Data: Acute renal failure  RENAL/URINARY TRACT ULTRASOUND COMPLETE  Comparison:  None.  Findings:  Right Kidney:  Measures 12.0 cm.  Echogenic renal parenchyma, suggesting medical renal disease.  No mass or hydronephrosis.  Left Kidney:  Measures 10.7 cm.  Echogenic renal parenchyma, suggesting medical renal disease.  9 x 7 x 7 mm interpolar cyst. No hydronephrosis.  Bladder:  Decompressed by indwelling Foley catheter.  IMPRESSION: No hydronephrosis.  Echogenic renal parenchyma, suggesting medical renal disease.   Original Report Authenticated By: Charline Bills, M.D.    Dg Chest Port 1 View  03/18/2012   *RADIOLOGY REPORT*  Clinical Data: Exertional shortness of breath, swelling in legs and feet  PORTABLE CHEST - 1 VIEW  Comparison: 07/18/2009  Findings: Cardiomegaly.  Pulmonary vascular congestion without frank interstitial edema. No pleural effusion or pneumothorax.  Stable elevation of the right hemidiaphragm with associated right basilar atelectasis.  IMPRESSION: Cardiomegaly with pulmonary vascular congestion.  No frank interstitial edema.   Original Report Authenticated By: Charline Bills, M.D.     EKG: Independently reviewed. Left anterior fascicular block.   Assessment/Plan 1-Acute on chronic renal failure stage IV:  Progression renal function. Dr Briant Cedar wonder if progression due to MM. Patient was started on IV lasix 160 every 6 hours. I have consulted Dr Gaylyn Rong  with hematology. Renal planning dialysis. 2-MGUS: Dr Gaylyn Rong consulted. Immunofixation ordered.  3-Diabetes: Will order SSI.  4-Bradycardia: Hold Beta blocker.  5-HTN: Hold clonidine due to bradycardia, hold Norvasc due to fluid retention. Continue with hydralazine. Hold bystolic due to bradycardia.  6-Dyspnea: Likely pulmonary edema, from renal failure. Had ECHO 2 weeks ago. Will request records.  7-Hyponatremia: in setting renal failure. 8-Anemia: chronic diseases, Vs MM ?     Code Status: Full Code, discussed with patient.  Family Communication: Daughters at bedside, care discussed with daughter.  Disposition Plan: Expect 4 to 5 days.   Time spent: 60 minutes.  REGALADO,BELKYS [facility] Pager [phone]  If 7PM-7AM, please contact night-coverage [email or web address] Password [facility] 03/18/2012, 1:59 PM

## 2012-03-18 NOTE — Progress Notes (Signed)
Event: 1930:  RN notified that pt found at shift change w/ increased WOB. Unable to speak more than 1-2 words at a time and using accessory muscles. 02 sats had been 94-100% on 4L [place]. Now sats 90% on 6L. RN reports BBS coarse w/ bil crackles wheezing and rhonchi. Rapid Response RN has been paged and is currently at bedside. She indicates it is likely pt will need tx to SDU and possible BiPAP. Albuterol/Atrovent neb and ordered and NP to bedside. Subjective: 1950:  Pt unable to give much information as by time I arrived to bedside pt already placed on Bi-PAP. Objective: [name] is a 67 y.o. male with PMH significant for CKD last cr at 3.2, MGUS , Diabetes, who presented to the ED today complaining of worsening shortness of breath for last 2 weeks prior to admission. He relates dyspnea at rest and on exertion. Describe orthopnea. He denies chest pain, nausea, vomiting, diarrhea. He saw Dr Donnie Coffin last year and was told that he did not have multiple myeloma. At bedside pt noted on Bi-PAP and resting quietly in NAD. Neb was completed and BBS much improved. BBS currently w/ faint crackles in lower bases R>L. Rt admits pt greatly improved rather quickly after being placed on Bi-PAP.  Current VS, BP-1347/53, HR-53, R-25 w/ 02 sats of 98% on Bi-PAP at 40%. Most recent temp 98. PCXR on admission reveals cardiomegaly with pulmonary vascular congestion. w/o frank interstitial edema. Repeat PCXR pending. Recent VBG not acutely abnormal will hold off on ABG for now.  Assessment/Plan: Acute respiratory distress (Much improved on BiPAP) BBS much improved w/ neb. Symptoms present on admission felt to be pulmonary edema related to acute on chronic renal failure. Afebrile. Pt has been transferred to SDU. Will continue Bi-PAP for now and given pt's prompt improvement will observe for 1 to 2 hrs and if pt continues to do well will begin to trial off of Bi-PAP as pt tolerates. Discussed pt w/ Dr Sunnie Nielsen. If pt were to  continue to deteriorate will notify renal service regarding further intervetions. Will  reassess in 1-2 hrs. Reassessment: At bedside pt noted resting w/ eyes closed. Arouses easily and admits he is feeling much better. 02 sats have remained 98-100% on Bi-PAP at 40%. Current VS BP-123/66, HR-59, RR-21. Discussed pt w/ RT. Will begin to trial pt off of Bi-PAP as he tolerates. PCXR pending. Will continue to monitor closely.  Leanne Chang, NP-C [facility] Pager [phone]

## 2012-03-18 NOTE — Consult Note (Signed)
[facility]  [phone]    ONCOLOGY  HOSPITAL CONSULTATION NOTE  Jack [name]                                MR#: [id number]  DOB: 03/30/1945                       CSN#: [id number]  Referring MD:  [facility]   Reason for Consult: MGUS  NWG:[id number] Jack [name] is a 67 y.o.  AA male asked to see for evaluation of MGUS. In review, the patient has a history of CKD followed by Dr. Briant Cedar.  The patient has been followed by Dr. Rubin onJanuary 14 2013, at which time, the patient had been referred by nephrology after an SPEP demonstrated an  M spike of 1.5 g/dL, and a total IgG of 8657 mg/dL, with a monoclonal protein with  Kappa light chain specificity. Total kappa light chains were 74 mg/L and a kappa  lambda ratio was 2.44.Dr. Donnie Coffin  proceeded with a bone marrow biopsy on 01/30/2013revealing slightly hypercellular bone marrow with trilineage hematopoiesis, 8% plasma cells. Flow cytometry showed no monoclonal B-cell population or abnormality cells in a type identified. and skeletal survey on 01/28/2011 showed no lytic foci. Beta 2 microglobulin on 01/28/2011 was 21.7. Quantitative immunoglobulins G  2550,A was 120 and M was 12. In  July 2013, urine creatinine of 188.7, and protein of 233. He was also initiated on Aranesp  Injection, but apparently he only received one dose on 03/28/2011. The patient was in his use showed state of health until admitted today with increasing shortness of breath over the last 2 weeks. He was diagnosed with acute on chronic renal failure,likely to need hemodialysis. fluid overload.new SPEP with immunofixation is being ordered, results pending.We were informed of the patient's admission, and in view of his prior studies, Hematology evaluation  was requested for  recommendations, rule out the possibility of developing multiple myeloma.        PMH:  Past Medical History  Diagnosis Date  . Renal disorder   . Hypertension   .  Diabetes mellitus without complication   . Shortness of breath   . Pneumonia     2012  . Heart murmur   . Glaucoma     Surgeries:  Past Surgical History  Procedure Laterality Date  . Thyroidectomy    . Cervical disc surgery    . Eye surgery      CATARACTS    Allergies:  Allergies  Allergen Reactions  . Ivp Dye (Iodinated Diagnostic Agents) Hives  . Morphine And Related     Bradycardia states patient    Medications:   Prior to Admission:  . [START ON 03/19/2012] aspirin EC  81 mg Oral Daily  . calcium acetate  667 mg Oral TID WC  . [START ON 03/19/2012] darbepoetin (ARANESP) injection - DIALYSIS  60 mcg Intravenous Q Thu-HD  . doxazosin  4 mg Oral QHS  . furosemide  160 mg Intravenous Q6H  . heparin  5,000 Units Subcutaneous Q8H  . hydrALAZINE  25 mg Oral TID  . insulin aspart  0-9 Units Subcutaneous TID WC  . [START ON 03/19/2012] levothyroxine  175 mcg Oral QAC breakfast  . multivitamin  1 tablet Oral Daily  . sodium chloride  3 mL Intravenous Q12H  . sodium chloride  3 mL Intravenous Q12H  GEX:BMWUXL chloride, sodium chloride  ROS: Constitutional: Negative for fever, chills or  night sweats.  Eyes: Negative for blurred vision and double vision.  Respiratory: Positive for shortness of breath on exertion. No pleuritic chest pain.  Cardiovascular: Negative for chest pain. No palpitations.  GI: Negative for  nausea, vomiting, diarrhea or constipation. No change in bowel caliber. No  Melena or hematochezia. Positive for  abdominal pain.  GU: Negative for hematuria. No loss of urinary control. No urinary retention.No changes in urine color.  Skin: Negative for itching. No rash. No petechia. No easy  Bruising. Musculoskeletal: Denies bone pain.  Neurological: No headaches.No confusion. No motor or sensory deficits.No headaches. Psych: No depression. No suicidal thoughts.  Family History:    Father who had history of renal failure. Mother had "bone disease" (?  Multiple Myeloma)    Social History:  reports that he quit smoking about 30 years ago. He has never used smokeless tobacco. He reports that  drinks alcohol. He reports that he does not use illicit drugs. Lives at home with Daughter.  He has 3 children in good health He was Engineer, site, music, work also for [employer]. Currently not working. He is divorced.    Physical Exam     Filed Vitals:   03/18/12 1400  BP: 141/64  Pulse: 50  Temp:   Resp: 24     There were no vitals filed for this visit.  General:   -year-old  in no acute distress A. and O. x3  well-developed, well-nourished.  HEENT: Normocephalic, atraumatic, PERRLA. Sclerae anicteric.No periorbital edema. Oral cavity without thrush or lesions.No glossitis. NECK:supple. no thyromegaly, no cervical or supraclavicular adenopathy  LUNGS: remarkable for by bibasilar rales. No axillary masses. BREASTS: not examined. CARDIOVASCULAR: regular rate and rhythm,no murmur , rubs or gallops ABDOMEN: distended. Abdominal edema.  bowel sounds x4. No HSM. No organomegaly.  GU/rectal: deferred. EXTREMITIES: significant 2-3+ edema in both lower extremities through the thigh and after the abdomen, likely anasarca. MUSCULOSKELETAL: no spinal tenderness.  NEURO: Non Focal. No Horner's.   Labs:   CBC   Recent Labs Lab 03/18/12 0940  WBC 6.8  HGB 9.8*  HCT 28.8*  PLT 170  MCV 83.5  MCH 28.4  MCHC 34.0  RDW 15.9*  LYMPHSABS 0.6*  MONOABS 0.7  EOSABS 0.1  BASOSABS 0.0     CMP    Recent Labs Lab 03/18/12 0940  NA 131*  K 5.1  CL 95*  CO2 18*  GLUCOSE 194*  BUN 91*  CREATININE 13.39*  CALCIUM 9.0  MG 2.5  AST 8  ALT 8  ALKPHOS 63  BILITOT 0.4     Anemia panel:  No results found for this basename: VITAMINB12, FOLATE, FERRITIN, TIBC, IRON, RETICCTPCT,  in the last 72 hours   Imaging Studies:  US Renal  03/18/2012  *RADIOLOGY REPORT*  Clinical Data: Acute renal failure  RENAL/URINARY TRACT ULTRASOUND  COMPLETE  Comparison:  None.  Findings:  Right Kidney:  Measures 12.0 cm.  Echogenic renal parenchyma, suggesting medical renal disease.  No mass or hydronephrosis.  Left Kidney:  Measures 10.7 cm.  Echogenic renal parenchyma, suggesting medical renal disease.  9 x 7 x 7 mm interpolar cyst. No hydronephrosis.  Bladder:  Decompressed by indwelling Foley catheter.  IMPRESSION: No hydronephrosis.  Echogenic renal parenchyma, suggesting medical renal disease.   Original Report Authenticated By: Charline Bills, M.D.    Dg Chest Port 1 View  03/18/2012  *RADIOLOGY REPORT*  Clinical Data: Exertional  shortness of breath, swelling in legs and feet  PORTABLE CHEST - 1 VIEW  Comparison: 07/18/2009  Findings: Cardiomegaly.  Pulmonary vascular congestion without frank interstitial edema. No pleural effusion or pneumothorax.  Stable elevation of the right hemidiaphragm with associated right basilar atelectasis.  IMPRESSION: Cardiomegaly with pulmonary vascular congestion.  No frank interstitial edema.   Original Report Authenticated By: Charline Bills, M.D.       A/P: as per Dr. Gaylyn Rong. Awaiting SIFE results, further testing to follow.  WERTMAN,SARA E, PA-C 03/18/2012 3:13 PM    ===============================  Attending's Note.    ATTENDING'S ATTESTATION:  I personally reviewed patient's chart, examined patient myself, formulated the treatment plan as followed.     PROBLEM LIST:  - Hypertension. - End stage renal disease due to start hemodialysis tomorrow. - Anemia. - History of positive M-spike in 01/2011.  - Respiratory distress due to pulmonary edema.   - Generalized edema due to ESRD.   IMPRESSION:  In 01/2011; he only had 8% plasma cell on bone marrow biopsy; not enough to call outright active myeloma.  He was instructed to have close follow up for monitor.  He was lost to follow up.  He now has ESRD due to start HD soon. I need to rule out progression of his MGUS to myeloma.  I discussed the  case in detail with patient and with his daughter over the phone.  If he turns out to have myeloma on work up (see below), he would definitely benefit from chemotherapy to prevent other complications of myeloma.  His renal failure is most likely end stage and irreversible even with chemo.  However, chemo will prevent other problem such as bone marrow failure, fracture. They expressed informed understanding and wished to proceed with work up.   RECOMMENDATIONS: - I personally talked with his nurse tonight and asked her for close monitoring of his respiratory status with low threshold to transfer to ICU. - I requested bone marrow biopsy by IR.  I requested skeletal survey.  SPEP/light chain/IFIX have been sent by primary team.  Result is still pending.  I will return to talk with patient and his family once result of work up are available.

## 2012-03-18 NOTE — Progress Notes (Signed)
Upon arrival to bedside for report. Patient appeared to be in respiratory distress.  MD notified and RR nurse called.  RN will continue to monitor. Louretta Parma, RN

## 2012-03-18 NOTE — ED Notes (Signed)
Pt transported to US

## 2012-03-18 NOTE — ED Notes (Signed)
Nephrology at bedside

## 2012-03-19 ENCOUNTER — Encounter (HOSPITAL_COMMUNITY)
Admission: EM | Disposition: A | Discharge status: Home / Self Care | Payer: Self-pay | Source: Home / Self Care | Attending: Internal Medicine

## 2012-03-19 ENCOUNTER — Inpatient Hospital Stay (HOSPITAL_COMMUNITY): Payer: Medicare Other

## 2012-03-19 ENCOUNTER — Encounter (HOSPITAL_COMMUNITY): Payer: Self-pay | Admitting: Radiology

## 2012-03-19 ENCOUNTER — Inpatient Hospital Stay (HOSPITAL_COMMUNITY): Payer: Medicare Other | Admitting: Anesthesiology

## 2012-03-19 ENCOUNTER — Encounter (HOSPITAL_COMMUNITY): Payer: Self-pay | Admitting: Anesthesiology

## 2012-03-19 DIAGNOSIS — N19 Unspecified kidney failure: Secondary | ICD-10-CM

## 2012-03-19 DIAGNOSIS — E872 Acidosis, unspecified: Secondary | ICD-10-CM | POA: Diagnosis present

## 2012-03-19 DIAGNOSIS — N186 End stage renal disease: Secondary | ICD-10-CM

## 2012-03-19 DIAGNOSIS — E890 Postprocedural hypothyroidism: Secondary | ICD-10-CM | POA: Diagnosis present

## 2012-03-19 DIAGNOSIS — E8770 Fluid overload, unspecified: Secondary | ICD-10-CM | POA: Diagnosis present

## 2012-03-19 HISTORY — PX: INSERTION OF DIALYSIS CATHETER: SHX1324

## 2012-03-19 LAB — RENAL FUNCTION PANEL
Albumin: 2.8 g/dL — ABNORMAL LOW (ref 3.5–5.2)
Chloride: 95 mEq/L — ABNORMAL LOW (ref 96–112)
GFR calc non Af Amer: 3 mL/min — ABNORMAL LOW (ref >90)
Phosphorus: 8 mg/dL — ABNORMAL HIGH (ref 2.3–4.6)
Potassium: 5.1 mEq/L (ref 3.5–5.1)

## 2012-03-19 LAB — GLUCOSE, CAPILLARY
Glucose-Capillary: 154 mg/dL — ABNORMAL HIGH (ref 70–99)
Glucose-Capillary: 130 mg/dL — ABNORMAL HIGH (ref 70–99)
Glucose-Capillary: 121 mg/dL — ABNORMAL HIGH (ref 70–99)
Glucose-Capillary: 120 mg/dL — ABNORMAL HIGH (ref 70–99)
Glucose-Capillary: 306 mg/dL — ABNORMAL HIGH (ref 70–99)

## 2012-03-19 LAB — CBC
HCT: 25.6 % — ABNORMAL LOW (ref 39.0–52.0)
Hemoglobin: 8.8 g/dL — ABNORMAL LOW (ref 13.0–17.0)
MCH: 29 pg (ref 26.0–34.0)
MCHC: 34.4 g/dL (ref 30.0–36.0)
RDW: 15.9 % — ABNORMAL HIGH (ref 11.5–15.5)

## 2012-03-19 LAB — HEMOGLOBIN A1C: Mean Plasma Glucose: 206 mg/dL — ABNORMAL HIGH (ref <117)

## 2012-03-19 LAB — HEPATITIS B CORE ANTIBODY, TOTAL: Hep B Core Total Ab: NEGATIVE

## 2012-03-19 LAB — HEPATITIS B SURFACE ANTIGEN: Hepatitis B Surface Ag: NEGATIVE

## 2012-03-19 SURGERY — INSERTION OF DIALYSIS CATHETER
Anesthesia: Monitor Anesthesia Care | Site: Chest | Laterality: Right | Wound class: Clean

## 2012-03-19 MED ORDER — CEFAZOLIN SODIUM-DEXTROSE 2-3 GM-% IV SOLR
INTRAVENOUS | Status: AC
  Filled 2012-03-19: qty 50

## 2012-03-19 MED ORDER — SODIUM CHLORIDE 0.9 % IV SOLN
INTRAVENOUS | Status: DC
  Administered 2012-03-19: 10:00:00 via INTRAVENOUS

## 2012-03-19 MED ORDER — HEPARIN SODIUM (PORCINE) 5000 UNIT/ML IJ SOLN
5000.0000 [IU] | Freq: Three times a day (TID) | INTRAMUSCULAR | Status: DC
  Administered 2012-03-21 – 2012-03-24 (×10): 5000 [IU] via SUBCUTANEOUS
  Filled 2012-03-19 (×14): qty 1

## 2012-03-19 MED ORDER — SODIUM CHLORIDE 0.9 % IR SOLN
Status: DC | PRN
  Administered 2012-03-19: 11:00:00

## 2012-03-19 MED ORDER — MIDAZOLAM HCL 2 MG/2ML IJ SOLN: 1.0000 mg | INTRAMUSCULAR | Status: DC | PRN

## 2012-03-19 MED ORDER — LIDOCAINE HCL (PF) 1 % IJ SOLN
INTRAMUSCULAR | Status: AC
  Filled 2012-03-19: qty 30

## 2012-03-19 MED ORDER — FENTANYL CITRATE 0.05 MG/ML IJ SOLN: 25.0000 ug | INTRAMUSCULAR | Status: DC | PRN

## 2012-03-19 MED ORDER — PROPOFOL 10 MG/ML IV BOLUS
INTRAVENOUS | Status: DC | PRN
  Administered 2012-03-19 (×2): 20 mg via INTRAVENOUS

## 2012-03-19 MED ORDER — FENTANYL CITRATE 0.05 MG/ML IJ SOLN: 50.0000 ug | Freq: Once | INTRAMUSCULAR | Status: DC

## 2012-03-19 MED ORDER — DARBEPOETIN ALFA-POLYSORBATE 60 MCG/0.3ML IJ SOLN
INTRAMUSCULAR | Status: AC
  Filled 2012-03-19: qty 0.3

## 2012-03-19 MED ORDER — HEPARIN SODIUM (PORCINE) 5000 UNIT/ML IJ SOLN
5000.0000 [IU] | Freq: Three times a day (TID) | INTRAMUSCULAR | Status: AC
  Administered 2012-03-19: 5000 [IU] via SUBCUTANEOUS
  Filled 2012-03-19 (×3): qty 1

## 2012-03-19 MED ORDER — SODIUM CHLORIDE 0.9 % IV SOLN
1020.0000 mg | Freq: Once | INTRAVENOUS | Status: AC
  Administered 2012-03-19: 1020 mg via INTRAVENOUS
  Filled 2012-03-19: qty 34

## 2012-03-19 MED ORDER — CEFAZOLIN SODIUM-DEXTROSE 2-3 GM-% IV SOLR
INTRAVENOUS | Status: DC | PRN
  Administered 2012-03-19: 2 g via INTRAVENOUS

## 2012-03-19 MED ORDER — LIDOCAINE HCL (PF) 1 % IJ SOLN
INTRAMUSCULAR | Status: DC | PRN
  Administered 2012-03-19: 30 mL

## 2012-03-19 MED ORDER — HEPARIN SODIUM (PORCINE) 1000 UNIT/ML IJ SOLN
INTRAMUSCULAR | Status: AC
  Filled 2012-03-19: qty 1

## 2012-03-19 MED ORDER — HEPARIN SODIUM (PORCINE) 1000 UNIT/ML IJ SOLN
INTRAMUSCULAR | Status: DC | PRN
  Administered 2012-03-19: 1000 [IU] via INTRAVENOUS

## 2012-03-19 MED ORDER — LIDOCAINE HCL (CARDIAC) 20 MG/ML IV SOLN
INTRAVENOUS | Status: DC | PRN
  Administered 2012-03-19: 50 mg via INTRAVENOUS

## 2012-03-19 MED ORDER — MIDAZOLAM HCL 5 MG/5ML IJ SOLN
INTRAMUSCULAR | Status: DC | PRN
  Administered 2012-03-19: 2 mg via INTRAVENOUS

## 2012-03-19 MED ORDER — 0.9 % SODIUM CHLORIDE (POUR BTL) OPTIME
TOPICAL | Status: DC | PRN
  Administered 2012-03-19: 1000 mL

## 2012-03-19 SURGICAL SUPPLY — 44 items
BAG DECANTER FOR FLEXI CONT (MISCELLANEOUS) ×2 IMPLANT
CATH CANNON HEMO 15F 50CM (CATHETERS) IMPLANT
CATH CANNON HEMO 15FR 19 (HEMODIALYSIS SUPPLIES) IMPLANT
CATH CANNON HEMO 15FR 23CM (HEMODIALYSIS SUPPLIES) ×2 IMPLANT
CATH CANNON HEMO 15FR 31CM (HEMODIALYSIS SUPPLIES) IMPLANT
CATH CANNON HEMO 15FR 32CM (HEMODIALYSIS SUPPLIES) IMPLANT
CLOTH BEACON ORANGE TIMEOUT ST (SAFETY) ×2 IMPLANT
COVER PROBE W GEL 5X96 (DRAPES) IMPLANT
COVER SURGICAL LIGHT HANDLE (MISCELLANEOUS) ×2 IMPLANT
DECANTER SPIKE VIAL GLASS SM (MISCELLANEOUS) ×2 IMPLANT
DERMABOND ADVANCED (GAUZE/BANDAGES/DRESSINGS) ×1
DERMABOND ADVANCED .7 DNX12 (GAUZE/BANDAGES/DRESSINGS) ×1 IMPLANT
DRAPE C-ARM 42X72 X-RAY (DRAPES) ×2 IMPLANT
DRAPE CHEST BREAST 15X10 FENES (DRAPES) ×2 IMPLANT
GAUZE SPONGE 2X2 8PLY STRL LF (GAUZE/BANDAGES/DRESSINGS) ×1 IMPLANT
GAUZE SPONGE 4X4 16PLY XRAY LF (GAUZE/BANDAGES/DRESSINGS) ×2 IMPLANT
GLOVE BIOGEL PI IND STRL 7.5 (GLOVE) ×1 IMPLANT
GLOVE BIOGEL PI INDICATOR 7.5 (GLOVE) ×1
GLOVE SS BIOGEL STRL SZ 7 (GLOVE) ×1 IMPLANT
GLOVE SUPERSENSE BIOGEL SZ 7 (GLOVE) ×1
GLOVE SURG SS PI 7.5 STRL IVOR (GLOVE) ×2 IMPLANT
GOWN PREVENTION PLUS XLARGE (GOWN DISPOSABLE) ×2 IMPLANT
GOWN STRL NON-REIN LRG LVL3 (GOWN DISPOSABLE) ×4 IMPLANT
KIT BASIN OR (CUSTOM PROCEDURE TRAY) ×2 IMPLANT
KIT ROOM TURNOVER OR (KITS) ×2 IMPLANT
NEEDLE 18GX1X1/2 (RX/OR ONLY) (NEEDLE) ×2 IMPLANT
NEEDLE 22X1 1/2 (OR ONLY) (NEEDLE) ×2 IMPLANT
NEEDLE HYPO 25GX1X1/2 BEV (NEEDLE) ×2 IMPLANT
NS IRRIG 1000ML POUR BTL (IV SOLUTION) ×2 IMPLANT
PACK SURGICAL SETUP 50X90 (CUSTOM PROCEDURE TRAY) ×2 IMPLANT
PAD ARMBOARD 7.5X6 YLW CONV (MISCELLANEOUS) ×4 IMPLANT
SOAP 2 % CHG 4 OZ (WOUND CARE) ×2 IMPLANT
SPONGE GAUZE 2X2 STER 10/PKG (GAUZE/BANDAGES/DRESSINGS) ×1
SUT ETHILON 3 0 PS 1 (SUTURE) ×2 IMPLANT
SUT VICRYL 4-0 PS2 18IN ABS (SUTURE) ×2 IMPLANT
SYR 20CC LL (SYRINGE) ×2 IMPLANT
SYR 30ML LL (SYRINGE) IMPLANT
SYR 5ML LL (SYRINGE) ×4 IMPLANT
SYR CONTROL 10ML LL (SYRINGE) ×2 IMPLANT
SYRINGE 10CC LL (SYRINGE) ×2 IMPLANT
TAPE CLOTH SURG 4X10 WHT LF (GAUZE/BANDAGES/DRESSINGS) ×2 IMPLANT
TOWEL OR 17X24 6PK STRL BLUE (TOWEL DISPOSABLE) ×2 IMPLANT
TOWEL OR 17X26 10 PK STRL BLUE (TOWEL DISPOSABLE) ×2 IMPLANT
WATER STERILE IRR 1000ML POUR (IV SOLUTION) ×2 IMPLANT

## 2012-03-19 NOTE — Interval H&P Note (Signed)
History and Physical Interval Note:  03/19/2012 9:44 AM  Jack [name]  has presented today for surgery, with the diagnosis of ESRD  The various methods of treatment have been discussed with the patient and family. After consideration of risks, benefits and other options for treatment, the patient has consented to  Procedure(s): INSERTION OF DIALYSIS CATHETER (N/A) as a surgical intervention .  The patient's history has been reviewed, patient examined, no change in status, stable for surgery.  I have reviewed the patient's chart and labs.  Questions were answered to the patient's satisfaction.     Josephina Gip

## 2012-03-19 NOTE — Anesthesia Preprocedure Evaluation (Addendum)
Anesthesia Evaluation  Patient identified by MRN, date of birth, ID band Patient awake    Reviewed: Allergy & Precautions, H&P , NPO status , Patient's Chart, lab work & pertinent test results  History of Anesthesia Complications Negative for: history of anesthetic complications  Airway Mallampati: I TM Distance: >3 FB Neck ROM: Limited    Dental  (+) Dental Advisory Given, Teeth Intact and Chipped,    Pulmonary shortness of breath, at rest and lying, pneumonia -, resolved,    + decreased breath sounds      Cardiovascular hypertension, Pt. on medications and Pt. on home beta blockers + Valvular Problems/Murmurs Rate:Normal  bradycardia   Neuro/Psych negative neurological ROS  negative psych ROS   GI/Hepatic negative GI ROS, Neg liver ROS,   Endo/Other  diabetes, Type 2, Oral Hypoglycemic AgentsHypothyroidism   Renal/GU ESRFRenal disease     Musculoskeletal negative musculoskeletal ROS (+)   Abdominal (+) + obese,   Peds  Hematology negative hematology ROS (+)   Anesthesia Other Findings Limited neck movement d/t anterior cervical w/plating. Pt states broken tooth left bottom.tb  Reproductive/Obstetrics negative OB ROS                         Anesthesia Physical Anesthesia Plan  ASA: III  Anesthesia Plan: MAC   Post-op Pain Management:    Induction: Intravenous  Airway Management Planned: Simple Face Mask  Additional Equipment:   Intra-op Plan:   Post-operative Plan:   Informed Consent: I have reviewed the patients History and Physical, chart, labs and discussed the procedure including the risks, benefits and alternatives for the proposed anesthesia with the patient or authorized representative who has indicated his/her understanding and acceptance.     Plan Discussed with: CRNA and Surgeon  Anesthesia Plan Comments:         Anesthesia Quick Evaluation

## 2012-03-19 NOTE — H&P (Signed)
Agree 

## 2012-03-19 NOTE — ED Provider Notes (Signed)
I personally performed the services described in this documentation, which was scribed in my presence. The recorded information has been reviewed and is accurate.  Derwood Kaplan, MD 03/19/12 [phone]

## 2012-03-19 NOTE — Progress Notes (Signed)
[facility] Progress Note [place] TEAM 1 - Stepdown/ICU TEAM   Jack [name] YQM:[id number] DOB: [birth year] DOA: 03/18/2012 PCP: No primary provider on file.  Brief narrative: 67 year old male patient with chronic kidney disease last documented creatinine 3.2 about one year prior. Diagnosed with MGUS January 2013. Workup included laboratory evaluation and bone marrow biopsy*by hematology. At that time no evidence of multiple myeloma was found. He presented to the emergency department on the date of admission because of increasing shortness of breath progressive over 2 weeks. Laboratory data revealed markedly increased creatinine of 13. His chest x-ray revealed pulmonary vascular congestion without frank interstitial edema and no effusion or pneumothorax. He was also bradycardic likely do to poorly cleared beta blockers in the setting of renal failure. He was initially admitted to the renal floor but developed respiratory distress overnight and required transfer to the step down unit for utilization of BiPAP. Subsequently he has been weaned off of BiPAP to nasal cannula oxygen. He is currently being evaluated by nephrology and hematology.  Assessment/Plan: Active Problems:   CKD (chronic kidney disease) stage 5, GFR less than 15 ml/min (progressive) / Metabolic acidosis -appreciate Nephro and VVS assistance  -minimal UOP despite high dose Lasix-plan is to proceed w/HD 3/6 after HD access placed    Acute respiratory failure with hypoxia due to Volume overload -improved and expect add'l improvement with initiation of HD -cont. supportive care (oxygen)      Hyponatremia -due to volume overload    MGUS (monoclonal gammopathy of unknown significance) -Appreciate heme assistance -concern MGUS has evolved into MM- WU in process including plans to repeat bone marrow bx    Anemia in chronic kidney disease/Iron deficiency -baseline hgb 2013 was 11-12; now down to 8.8 but suspect degree  of dilution from vol. Overload -? Will need IV iron this admit -Aranesp per nephro    Bradycardia -rates have increased from 40's to 50's-hemodynamically stable -cont. To hold BB -expect will improve after HD    Essential hypertension, benign -normotensive-remains on hydralazine for now but watch for post HD hypotension    Diabetes mellitus type 1, controlled, with complications -CBG controlled here- check HgbA1c -cont. SSI -was on Onglyza at home- consider dc OHA in favor of Levemir or Lantus in pt w/ CKD req. HD   Hypothyroidism -cont synthroid and check TSH   DVT prophylaxis: Subcutaneous heparin Code Status: Full Family Communication: Patient and his daughters Disposition Plan: Remain in step down until proves tolerates dialysis without significant hemodynamic compromise and until respiratory status stabilized  Consultants: Nephrology Hematology  Procedures: Insertion cuffed hemodialysis catheter right IJ (23 cm) (03/19/12)  Antibiotics: Ancef IV preop x1 dose (03/19/12)  HPI/Subjective: Patient alert and endorses breathing much better now. Denies chest pain or other complaints.   Objective: Blood pressure 135/57, pulse 52, temperature 98.2 F (36.8 C), temperature source Oral, resp. rate 19, height 5\' 10"  (1.778 m), weight 101 kg (222 lb 10.6 oz), SpO2 97.00%.  Intake/Output Summary (Last 24 hours) at 03/19/12 1233 Last data filed at 03/19/12 1208  Gross per 24 hour  Intake    725 ml  Output    651 ml  Net     74 ml     Exam: General: No acute respiratory distress Lungs: Clear to auscultation bilaterally without wheezes or crackles, diminished in bases bilaterally, 5 L nasal cannula oxygen Cardiovascular: Regular rate and rhythm without murmur gallop or rub normal S1 and S2, moderate JVD and 2+ bilateral lower extremity Abdomen:  Nontender, somewhat distended but soft, bowel sounds positive, no rebound, no ascites, no appreciable mass Musculoskeletal: No  significant cyanosis, clubbing of bilateral lower extremities Neurological: Alert and oriented x3, moves all extremities x4, cranial nerves II through XII grossly intact  Data Reviewed: Basic Metabolic Panel:  Recent Labs Lab 03/18/12 0940 03/18/12 1556 03/19/12 0615  NA 131*  --  132*  K 5.1  --  5.1  CL 95*  --  95*  CO2 18*  --  18*  GLUCOSE 194*  --  166*  BUN 91*  --  97*  CREATININE 13.39* 13.84* 14.27*  CALCIUM 9.0  --  8.6  MG 2.5  --   --   PHOS 7.5*  --  8.0*   Liver Function Tests:  Recent Labs Lab 03/18/12 0940 03/19/12 0615  AST 8  --   ALT 8  --   ALKPHOS 63  --   BILITOT 0.4  --   PROT 7.8  --   ALBUMIN 3.1* 2.8*   No results found for this basename: LIPASE, AMYLASE,  in the last 168 hours No results found for this basename: AMMONIA,  in the last 168 hours CBC:  Recent Labs Lab 03/18/12 0940 03/18/12 1556 03/19/12 0615  WBC 6.8 5.8 6.1  NEUTROABS 5.4  --   --   HGB 9.8* 9.6* 8.8*  HCT 28.8* 27.7* 25.6*  MCV 83.5 84.2 84.5  PLT 170 171 162   Cardiac Enzymes: No results found for this basename: CKTOTAL, CKMB, CKMBINDEX, TROPONINI,  in the last 168 hours BNP (last 3 results)  Recent Labs  03/18/12 0940  PROBNP 12648.0*   CBG:  Recent Labs Lab 03/18/12 1641 03/18/12 2114 03/19/12 0801 03/19/12 1007 03/19/12 1110  GLUCAP 140* 200* 154* 130* 121*    Recent Results (from the past 240 hour(s))  MRSA PCR SCREENING     Status: None   Collection Time    03/18/12  8:55 PM      Result Value Range Status   MRSA by PCR NEGATIVE  NEGATIVE Final   Comment:            The GeneXpert MRSA Assay (FDA     approved for NASAL specimens     only), is one component of a     comprehensive MRSA colonization     surveillance program. It is not     intended to diagnose MRSA     infection nor to guide or     monitor treatment for     MRSA infections.     Studies:  Recent x-ray studies have been reviewed in detail by the Attending  Physician  Scheduled Meds:  Reviewed in detail by the Attending Physician   Junious Silk, ANP [facility] Office  [phone] Pager [phone]  On-Call/Text Page:      [name]      password [email or web address]  If 7PM-7AM, please contact night-coverage [email or web address] Password [email or web address] 03/19/2012, 12:33 PM   LOS: 1 day   I have personally examined the patient and reviewed the entire database. Agree with the above note and plan as outlined, any necessary changes made. Appreciate Nephrology and Hematology assistance. CT guided bone marrow Biopsy per IR on 3/7. Patient had inconclusive Bx one year ago, now concern for possible myeloma. Dialysis cath to be placed today, HD per renal. Acute respiratory failure somewhat improved this morning, off BiPAP on 5 L O2 via nasal cannula, will likely improve further after initiation of hemodialysis.  RAI,RIPUDEEP M.D.  [facility] 03/19/2012, 2:12 PM Pager: [phone]  If 7PM-7AM, please contact night-coverage [email or web address] Password [email or web address]

## 2012-03-19 NOTE — Transfer of Care (Signed)
Immediate Anesthesia Transfer of Care Note  Patient: Jack [name]  Procedure(s) Performed: Procedure(s) with comments: INSERTION OF DIALYSIS CATHETER (Right) - Right Internal Jugular  Patient Location: PACU  Anesthesia Type:MAC  Level of Consciousness: awake, alert  and oriented  Airway & Oxygen Therapy: Patient Spontanous Breathing and Patient connected to nasal cannula oxygen  Post-op Assessment: Report given to PACU RN and Post -op Vital signs reviewed and stable  Post vital signs: Reviewed and stable  Complications: No apparent anesthesia complications

## 2012-03-19 NOTE — Op Note (Signed)
OPERATIVE REPORT  Date of Surgery:  03/18/2012 - 03/19/2012  Surgeon: Josephina Gip, MD  Assistant: Nurse  Pre-op Diagnosis: ESRD  Post-op Diagnosis: ESRD  Procedure: Procedure(s): #1 bilateral ultrasound localization internal jugular veins #2 insertion cuffed hemodialysis catheter right IJ (23 cm) Anesthesia: General  EBL: Minimal  Complications: None  Procedure Details: Patient was taken to the operating room placed in supine position at which time upper chest and neck were exposed. Both internal jugular veins were imaged using B-mode ultrasound using the SonoSite. Both were noted to be widely patent. After prepping and draping in the routine sterile manner the right IJ was entered using a supraclavicular approach guidewire passed into the right atrium under fluoroscopic guidance. After dilating the track appropriately a 23 cm diateck catheter was passed through peel-away sheath positioned in the right atrium tongue peripherally and secured with nylon sutures. Wound was closed with in layers with Vicryl in a subcuticular fashion. Sterile dressing applied patient taken to the recovery room for chest x-ray  Josephina Gip, MD 03/19/2012 10:57 AM

## 2012-03-19 NOTE — Preoperative (Signed)
Beta Blockers   Reason not to administer Beta Blockers:Not Applicable 

## 2012-03-19 NOTE — Progress Notes (Signed)
[facility] - PROGRESS NOTE Resident Note   Please see below for attending addendum to resident note.  Subjective:   Feels better this am. Less SOB and swelling subjectively.  Patient had episode of respiratory distress last night and was put on BIPAP and transferred to SDU. CXR repeated with no significant changes. He is weaned down to [place] 5 L.  Objective:    Vital Signs:   Temp:  [97.9 F (36.6 C)-98.3 F (36.8 C)] 97.9 F (36.6 C) (03/06 0355) Pulse Rate:  [44-70] 54 (03/06 0355) Resp:  [13-26] 18 (03/06 0355) BP: (123-147)/(55-88) 137/57 mmHg (03/06 0355) SpO2:  [90 %-100 %] 97 % (03/06 0355) FiO2 (%):  [40 %] 40 % (03/05 2050) Weight:  [221 lb 9 oz (100.5 kg)-222 lb 10.6 oz (101 kg)] 222 lb 10.6 oz (101 kg) (03/05 2038)    24-hour weight change: Weight change:   Weight trends: Filed Weights   03/18/12 1500 03/18/12 2038  Weight: 221 lb 9 oz (100.5 kg) 222 lb 10.6 oz (101 kg)    Intake/Output:  03/05 0701 - 03/06 0700 In: 435 [P.O.:300; I.V.:3; IV Piggyback:132] Out: 301 [Urine:300; Stool:1]  Physical Exam:  General:  Vital signs reviewed and noted. Well developed. No acute distress ; alert, appropriate and cooperative throughout examination.   Head:  Normocephalic, atraumatic.   Eyes:  PERRL, EOMI, No signs of anemia or jaundince.   Nose:  Mucous membranes moist, not inflammed, nonerythematous.   Throat:  Oropharynx nonerythematous, no exudate appreciated.   Neck:  No deformities, masses, or tenderness noted.Supple, No carotid Bruits, ++ JVD 11.   Lungs:  Normal respiratory effort. Bibasilar lungs crackles noted. No wheezes.   Heart:  RRR. S1 and S2 normal without gallop, murmur, or rubs.   Abdomen:  BS normoactive. Soft, non-tender. No masses or organomegaly. Abdominal wall edema noted. + distend   Extremities:  Pitting edema 3+ up to thigh level.   Neurologic:  A&O X3, CN II - XII are grossly intact. Motor strength is 5/5 in the all 4  extremities, Sensations intact to light touch, Cerebellar signs negative.   Skin:  No visible rashes, scars.      Labs: Basic Metabolic Panel:  Recent Labs Lab 03/18/12 0940 03/18/12 1556 03/19/12 0615  NA 131*  --  132*  K 5.1  --  5.1  CL 95*  --  95*  CO2 18*  --  18*  GLUCOSE 194*  --  166*  BUN 91*  --  97*  CREATININE 13.39* 13.84* 14.27*  CALCIUM 9.0  --  8.6  MG 2.5  --   --   PHOS 7.5*  --  8.0*    Liver Function Tests:  Recent Labs Lab 03/18/12 0940 03/19/12 0615  AST 8  --   ALT 8  --   ALKPHOS 63  --   BILITOT 0.4  --   PROT 7.8  --   ALBUMIN 3.1* 2.8*   No results found for this basename: LIPASE, AMYLASE,  in the last 168 hours No results found for this basename: AMMONIA,  in the last 168 hours  CBC:  Recent Labs Lab 03/18/12 0940 03/18/12 1556 03/19/12 0615  WBC 6.8 5.8 6.1  NEUTROABS 5.4  --   --   HGB 9.8* 9.6* 8.8*  HCT 28.8* 27.7* 25.6*  MCV 83.5 84.2 84.5  PLT 170 171 162    Cardiac Enzymes: No results found for this basename: CKTOTAL, CKMB, TROPONINI,  in the  last 168 hours  BNP: No components found with this basename: POCBNP,   CBG:  Recent Labs Lab 03/18/12 1641 03/18/12 2114 03/19/12 0801  GLUCAP 140* 200* 154*    Microbiology: Results for orders placed during the hospital encounter of 03/18/12  MRSA PCR SCREENING     Status: None   Collection Time    03/18/12  8:55 PM      Result Value Range Status   MRSA by PCR NEGATIVE  NEGATIVE Final   Comment:            The GeneXpert MRSA Assay (FDA     approved for NASAL specimens     only), is one component of a     comprehensive MRSA colonization     surveillance program. It is not     intended to diagnose MRSA     infection nor to guide or     monitor treatment for     MRSA infections.    Coagulation Studies:  Recent Labs  03/19/12 0712  LABPROT 16.0*  INR 1.31    Urinalysis:    Component Value Date/Time   COLORURINE YELLOW 03/18/2012 1259    APPEARANCEUR CLEAR 03/18/2012 1259   LABSPEC 1.015 03/18/2012 1259   PHURINE 5.0 03/18/2012 1259   GLUCOSEU NEGATIVE 03/18/2012 1259   HGBUR NEGATIVE 03/18/2012 1259   BILIRUBINUR NEGATIVE 03/18/2012 1259   KETONESUR NEGATIVE 03/18/2012 1259   PROTEINUR >300* 03/18/2012 1259   UROBILINOGEN 0.2 03/18/2012 1259   NITRITE NEGATIVE 03/18/2012 1259   LEUKOCYTESUR TRACE* 03/18/2012 1259     Imaging: US Renal  03/18/2012  *RADIOLOGY REPORT*  Clinical Data: Acute renal failure  RENAL/URINARY TRACT ULTRASOUND COMPLETE  Comparison:  None.  Findings:  Right Kidney:  Measures 12.0 cm.  Echogenic renal parenchyma, suggesting medical renal disease.  No mass or hydronephrosis.  Left Kidney:  Measures 10.7 cm.  Echogenic renal parenchyma, suggesting medical renal disease.  9 x 7 x 7 mm interpolar cyst. No hydronephrosis.  Bladder:  Decompressed by indwelling Foley catheter.  IMPRESSION: No hydronephrosis.  Echogenic renal parenchyma, suggesting medical renal disease.   Original Report Authenticated By: Charline Bills, M.D.    Dg Chest Port 1 View  03/18/2012  *RADIOLOGY REPORT*  Clinical Data: Acute shortness of breath.  PORTABLE CHEST - 1 VIEW  Comparison: Chest radiograph performed earlier today at 09:31 a.m.  Findings: There is stable elevation of the right hemidiaphragm, with associated right basilar opacity likely reflecting atelectasis.  Pulmonary vascularity is at the upper limits of normal.  No definite pleural effusion or pneumothorax is seen.  The cardiomediastinal silhouette is borderline normal in size.  No acute osseous abnormalities are seen.  IMPRESSION: Stable elevation of the right hemidiaphragm; associated right basilar opacity likely reflects atelectasis.   Original Report Authenticated By: Tonia Ghent, M.D.    Dg Chest Port 1 View  03/18/2012  *RADIOLOGY REPORT*  Clinical Data: Exertional shortness of breath, swelling in legs and feet  PORTABLE CHEST - 1 VIEW  Comparison: 07/18/2009  Findings: Cardiomegaly.   Pulmonary vascular congestion without frank interstitial edema. No pleural effusion or pneumothorax.  Stable elevation of the right hemidiaphragm with associated right basilar atelectasis.  IMPRESSION: Cardiomegaly with pulmonary vascular congestion.  No frank interstitial edema.   Original Report Authenticated By: Charline Bills, M.D.       Medications:    Infusions:    Scheduled Medications: . aspirin EC  81 mg Oral Daily  . calcium acetate  667 mg Oral TID  WC  . darbepoetin (ARANESP) injection - DIALYSIS  60 mcg Intravenous Q Thu-HD  . doxazosin  4 mg Oral QHS  . furosemide  160 mg Intravenous Q6H  . heparin  5,000 Units Subcutaneous Q8H  . hydrALAZINE  25 mg Oral TID  . insulin aspart  0-9 Units Subcutaneous TID WC  . levothyroxine  175 mcg Oral QAC breakfast  . multivitamin  1 tablet Oral Daily  . sodium chloride  3 mL Intravenous Q12H    PRN Medications: sodium chloride, acetaminophen, sodium chloride   Assessment/ Plan:   Patient is a 67 y.o. male with a PMHx of DM, HTN, CKD, MGUS and self reported thyroid removal, who was admitted to [facility] on 03/18/2012 for evaluation of shortness of breath. Renal is consulted for A/C RF.  History of MGUS followed by oncology Dr. Zella Ball with last OV 02/28/11--" he has an evolving myeloma vs active disease causing renal dysfunction. I have proposed that we recheck labs in 2 months and begin aranesp".  History of CKD followed by Dr. Georgia Dom. Baseline BNU 22 and Cr 3.29 with GRF 29 in Dec 2013.  Renal artery doppler 02/08/11-- unremarkable study   1. Acute on chronic renal failure in the setting of CKD IV , baseline Cr 3.29 Etiology is unclear. Concerning relation with MGUS vs Multiple Myeloma.  - Renal U/S---Echogenic renal parenchyma, suggesting medical renal disease  Minimum UOP 300, no weight change.   - Hepatitis panel, PTH pending - Renal function daily  - Immunofixation, UPEP, SPEP pending and Hematology consult for possible  Myeloma - Anemia panel--iron deficiency--consider Iron IV - Lasix 160 IV every 6--- Last dose 1230 noon and HD today - Phoslo, Aranesp    - Perm cath insertion and HD today. - Will clip for [facility]   2. Fluid overload  Likely associated with A/C RF. Pertinent history include proteinuria (Ucr 188.7 and protein 233 in July 2013) and MGUS. - ProBNP > 12,000 likely associated with his ARF and fluid overload.   No history of liver disease or cirrhosis..  No history of CHF, but was referred to cardiologist recently for echo and EKG. Will obtain office records.  Reports baseline weight of 202 lbs.   - respiratory distress overnight with no overt pulmonary edema noted on CXR - Lasix 160 q 6 with minimum UOP  - HD after perm cath today   3. Hyponatremia, likely associate with fluid overload-->hypervolemic hyponatremia  - Lasix   4. Anemia, baseline at 11   Recent Labs Lab 03/18/12 0940 03/18/12 1556 03/19/12 0615  HGB 9.8* 9.6* 8.8*  HCT 28.8* 27.7* 25.6*  WBC 6.8 5.8 6.1  PLT 170 171 162    - see above - Iron and aranesp  5. MGUS vs Multiple Myeloma - Hematology consult - BM biopsy by IR  DVT PPX - recs Heparin--defer to primary care team.     Length of Stay: 1 days  Patient history and plan of care reviewed with attending, Dr. Briant Cedar.   Dede Query, MD  PGYII, Internal Medicine Resident 03/19/2012, 8:29 AM I have seen and examined this patient and agree with plan per Dr Ian Bushman.  Will plan HD today and again tomorrow.  Will give IV iron.   MATTINGLY,MICHAEL T,MD 03/19/2012 10:44 AM

## 2012-03-19 NOTE — Anesthesia Postprocedure Evaluation (Signed)
  Anesthesia Post-op Note  Patient: Jack [name]  Procedure(s) Performed: Procedure(s) with comments: INSERTION OF DIALYSIS CATHETER (Right) - Right Internal Jugular  Patient Location: PACU  Anesthesia Type:MAC  Level of Consciousness: awake  Airway and Oxygen Therapy: Patient Spontanous Breathing  Post-op Pain: mild  Post-op Assessment: Post-op Vital signs reviewed, Patient's Cardiovascular Status Stable, Respiratory Function Stable, Patent Airway, No signs of Nausea or vomiting and Pain level controlled  Post-op Vital Signs: stable  Complications: No apparent anesthesia complications

## 2012-03-19 NOTE — H&P (View-Only) (Signed)
[facility] CONSULT NOTE 03/18/2012 DOB: [id number] MRN : [id number]  CC: ESRD Referring Physician: Dr Briant Cedar He is followed by Nephrologist Dr. Georgia Dom.  History of Present Illness: Jack [name] is a 67 y.o. male with PMH significant for CKD last cr at 3.2, MGUS , Diabetes, who presents to ED complaining of worsening shortness of breath for last 2 weeks prior to admission. He relates dyspnea at rest and on exertion. Describe orthopnea. He denies chest pain, nausea, vomiting, diarrhea. 2 months age Scr 3.7 and now 54. We were asked to place IDC catheter and evaluate for perm access. Pt is RHD and is not yet on HD   Past Medical History  Diagnosis Date  . Renal disorder   . Hypertension   . Diabetes mellitus without complication   . Shortness of breath   . Pneumonia     2012  . Heart murmur   . Glaucoma     Past Surgical History  Procedure Laterality Date  . Thyroidectomy    . Cervical disc surgery    . Eye surgery      CATARACTS     ROS: [x]  Positive  [ ]  Denies    General: [ ]  Weight loss, [ ]  Fever, [ ]  chills Neurologic: [ ]  Dizziness, [ ]  Blackouts, [ ]  Seizure [ ]  Stroke, [ ]  "Mini stroke", [ ]  Slurred speech, [ ]  Temporary blindness; [ ]  weakness in arms or legs, [ ]  Hoarseness Cardiac: [ ]  Chest pain/pressure, [x ] Shortness of breath at rest [ x] Shortness of breath with exertion, [ ]  Atrial fibrillation or irregular heartbeat Vascular: [ ]  Pain in legs with walking, [ ]  Pain in legs at rest, [ ]  Pain in legs at night,  [ ]  Non-healing ulcer, [ ]  Blood clot in vein/DVT,   Pulmonary: [ ]  Home oxygen, [ ]  Productive cough, [ ]  Coughing up blood, [ ]  Asthma,  [ ]  Wheezing Musculoskeletal:  [ ]  Arthritis, [ ]  Low back pain, [ ]  Joint pain Hematologic: [ ]  Easy Bruising, [x ] Anemia; [ ]  Hepatitis Gastrointestinal: [ ]  Blood in stool, [ ]  Gastroesophageal Reflux/heartburn, [ ]  Trouble swallowing Urinary: [ x] chronic Kidney  disease, [ ]  on HD - [ ]  MWF or [ ]  TTHS, [ ]  Burning with urination, [ ]  Difficulty urinating Skin: [ ]  Rashes, [ ]  Wounds Psychological: [ ]  Anxiety, [ ]  Depression  Social History History  Substance Use Topics  . Smoking status: Former Smoker    Quit date: 03/19/1982  . Smokeless tobacco: Never Used  . Alcohol Use: Yes     Comment: daily    Family History History reviewed. No pertinent family history.  Allergies  Allergen Reactions  . Ivp Dye (Iodinated Diagnostic Agents) Hives  . Morphine And Related     Bradycardia states patient    Current Facility-Administered Medications  Medication Dose Route Frequency Provider Last Rate Last Dose  . 0.9 %  sodium chloride infusion  250 mL Intravenous PRN Belkys A Regalado, MD      . Melene Muller ON 03/19/2012] aspirin EC tablet 81 mg  81 mg Oral Daily Belkys A Regalado, MD      . calcium acetate (PHOSLO) capsule 667 mg  667 mg Oral TID WC Dyke Maes, MD      . Melene Muller ON 03/19/2012] darbepoetin (ARANESP) injection 60 mcg  60 mcg Intravenous Q Thu-HD Dyke Maes, MD      . doxazosin (  CARDURA) tablet 4 mg  4 mg Oral QHS Belkys A Regalado, MD      . furosemide (LASIX) 160 mg in dextrose 5 % 50 mL IVPB  160 mg Intravenous Q6H Na Li, MD      . heparin injection 5,000 Units  5,000 Units Subcutaneous Q8H Belkys A Regalado, MD      . hydrALAZINE (APRESOLINE) tablet 25 mg  25 mg Oral TID Belkys A Regalado, MD      . insulin aspart (novoLOG) injection 0-9 Units  0-9 Units Subcutaneous TID WC Belkys A Regalado, MD      . Melene Muller ON 03/19/2012] levothyroxine (SYNTHROID, LEVOTHROID) tablet 175 mcg  175 mcg Oral QAC breakfast Belkys A Regalado, MD      . multivitamin (RENA-VIT) tablet 1 tablet  1 tablet Oral Daily Dyke Maes, MD      . sodium chloride 0.9 % injection 3 mL  3 mL Intravenous Q12H Belkys A Regalado, MD      . sodium chloride 0.9 % injection 3 mL  3 mL Intravenous Q12H Belkys A Regalado, MD      . sodium chloride 0.9 %  injection 3 mL  3 mL Intravenous PRN Alba Cory, MD         Imaging: US Renal  03/18/2012  *RADIOLOGY REPORT*  Clinical Data: Acute renal failure  RENAL/URINARY TRACT ULTRASOUND COMPLETE  Comparison:  None.  Findings:  Right Kidney:  Measures 12.0 cm.  Echogenic renal parenchyma, suggesting medical renal disease.  No mass or hydronephrosis.  Left Kidney:  Measures 10.7 cm.  Echogenic renal parenchyma, suggesting medical renal disease.  9 x 7 x 7 mm interpolar cyst. No hydronephrosis.  Bladder:  Decompressed by indwelling Foley catheter.  IMPRESSION: No hydronephrosis.  Echogenic renal parenchyma, suggesting medical renal disease.   Original Report Authenticated By: Charline Bills, M.D.    Dg Chest Port 1 View  03/18/2012  *RADIOLOGY REPORT*  Clinical Data: Exertional shortness of breath, swelling in legs and feet  PORTABLE CHEST - 1 VIEW  Comparison: 07/18/2009  Findings: Cardiomegaly.  Pulmonary vascular congestion without frank interstitial edema. No pleural effusion or pneumothorax.  Stable elevation of the right hemidiaphragm with associated right basilar atelectasis.  IMPRESSION: Cardiomegaly with pulmonary vascular congestion.  No frank interstitial edema.   Original Report Authenticated By: Charline Bills, M.D.     Significant Diagnostic Studies: CBC Lab Results  Component Value Date   WBC 6.8 03/18/2012   HGB 9.8* 03/18/2012   HCT 28.8* 03/18/2012   MCV 83.5 03/18/2012   PLT 170 03/18/2012    BMET    Component Value Date/Time   NA 131* 03/18/2012 0940   K 5.1 03/18/2012 0940   CL 95* 03/18/2012 0940   CO2 18* 03/18/2012 0940   GLUCOSE 194* 03/18/2012 0940   BUN 91* 03/18/2012 0940   CREATININE 13.39* 03/18/2012 0940   CALCIUM 9.0 03/18/2012 0940   GFRNONAA 3* 03/18/2012 0940   GFRAA 4* 03/18/2012 0940    COAG No results found for this basename: INR, PROTIME   No results found for this basename: PTT     Physical Examination BP Readings from Last 3 Encounters:  03/18/12 133/55   02/28/11 173/78  02/13/11 182/91   Temp Readings from Last 3 Encounters:  03/18/12 97.9 F (36.6 C) Oral  02/28/11 98.2 F (36.8 C) Oral  02/13/11 98.3 F (36.8 C) Oral   SpO2 Readings from Last 3 Encounters:  03/18/12 95%   Pulse Readings from Last  3 Encounters:  03/18/12 70  02/28/11 78  02/13/11 82    General:  WDWN in NAD Gait: Normal HENT: WNL Eyes: Pupils equal Pulmonary: normal non-labored breathing  Cardiac: RRR, without  Murmurs, rubs or gallops; No carotid bruits Abdomen: soft, NT, no masses Skin: no rashes, ulcers noted Vascular Exam/Pulses:2+ radial pulses palp bilat 1+ palp ulnar pulses Both hands warm with good grip Extremities without ischemic changes, no Gangrene , no cellulitis; no open wounds;  Musculoskeletal: no muscle wasting or atrophy  Neurologic: A&O X 3; Appropriate Affect ;  SENSATION: normal; MOTOR FUNCTION: Pt has good and equal strength in all extremities - 5/5 Speech is fluent/normal  Non-Invasive Vascular Imaging: vein mapping pending   ASSESSMENT/PLAN: Jack [name] is a 67 y.o. male with CKD who has progressed to sCR 13 from 3. We will place an IDC catheter tomorrow Save Left arm for AVF Vein mapping for perm access ordered - depending on result will proceed with AVF vs AVGG  Agree with above-plan hemodialysis catheter insertion on Thursday and evaluate for aVF

## 2012-03-19 NOTE — Progress Notes (Signed)
Utilization Review Completed. 03/19/2012  

## 2012-03-19 NOTE — H&P (Signed)
Jack [name] is an 67 y.o. male.   Chief Complaint: SOB Pt with new acute on chronic kidney failure Scheduled today for catheter placement and possible fistula in OR Pt also for evaluation of MGUS (monoclonal gammopathy) poss Multiple Myeloma Was seen 1 yr ago and Bone Marrow bx was inconclusive with Dr Donnie Coffin Scheduled now for BM Bx in am HPI: CKD; HTN; DM; SOB; glaucoma  Past Medical History  Diagnosis Date  . Renal disorder   . Hypertension   . Diabetes mellitus without complication   . Shortness of breath   . Pneumonia     2012  . Heart murmur   . Glaucoma     Past Surgical History  Procedure Laterality Date  . Thyroidectomy    . Cervical disc surgery    . Eye surgery      CATARACTS    History reviewed. No pertinent family history. Social History:  reports that he quit smoking about 30 years ago. He has never used smokeless tobacco. He reports that  drinks alcohol. He reports that he does not use illicit drugs.  Allergies:  Allergies  Allergen Reactions  . Ivp Dye (Iodinated Diagnostic Agents) Hives  . Morphine And Related     Bradycardia states patient    Medications Prior to Admission  Medication Sig Dispense Refill  . amLODipine (NORVASC) 10 MG tablet Take 10 mg by mouth daily.      Marland Kitchen aspirin EC 81 MG tablet Take 81 mg by mouth daily.      . calcitRIOL (ROCALTROL) 0.25 MCG capsule Take 0.25 mcg by mouth daily.      . cloNIDine (CATAPRES) 0.2 MG tablet Take 0.2 mg by mouth 2 (two) times daily.      Marland Kitchen doxazosin (CARDURA) 4 MG tablet Take 4 mg by mouth at bedtime.       . furosemide (LASIX) 80 MG tablet Take 120 mg by mouth 2 (two) times daily.      . hydrALAZINE (APRESOLINE) 25 MG tablet Take 25 mg by mouth 3 (three) times daily.      Marland Kitchen levothyroxine (SYNTHROID, LEVOTHROID) 175 MCG tablet Take 175 mcg by mouth daily.      . nebivolol (BYSTOLIC) 10 MG tablet Take 10 mg by mouth daily.      . saxagliptin HCl (ONGLYZA) 5 MG TABS tablet Take 5 mg by mouth  daily.      Marland Kitchen spironolactone (ALDACTONE) 25 MG tablet Take 25 mg by mouth daily.        Results for orders placed during the hospital encounter of 03/18/12 (from the past 48 hour(s))  PRO B NATRIURETIC PEPTIDE     Status: Abnormal   Collection Time    03/18/12  9:40 AM      Result Value Range   Pro B Natriuretic peptide (BNP) 12648.0 (*) 0 - 125 pg/mL  COMPREHENSIVE METABOLIC PANEL     Status: Abnormal   Collection Time    03/18/12  9:40 AM      Result Value Range   Sodium 131 (*) 135 - 145 mEq/L   Potassium 5.1  3.5 - 5.1 mEq/L   Chloride 95 (*) 96 - 112 mEq/L   CO2 18 (*) 19 - 32 mEq/L   Glucose, Bld 194 (*) 70 - 99 mg/dL   BUN 91 (*) 6 - 23 mg/dL   Creatinine, Ser 16.10 (*) 0.50 - 1.35 mg/dL   Calcium 9.0  8.4 - 96.0 mg/dL   Total Protein 7.8  6.0 - 8.3 g/dL   Albumin 3.1 (*) 3.5 - 5.2 g/dL   AST 8  0 - 37 U/L   ALT 8  0 - 53 U/L   Alkaline Phosphatase 63  39 - 117 U/L   Total Bilirubin 0.4  0.3 - 1.2 mg/dL   GFR calc non Af Amer 3 (*) >90 mL/min   GFR calc Af Amer 4 (*) >90 mL/min   Comment:            The eGFR has been calculated     using the CKD EPI equation.     This calculation has not been     validated in all clinical     situations.     eGFR's persistently     <90 mL/min signify     possible Chronic Kidney Disease.  CBC WITH DIFFERENTIAL     Status: Abnormal   Collection Time    03/18/12  9:40 AM      Result Value Range   WBC 6.8  4.0 - 10.5 K/uL   RBC 3.45 (*) 4.22 - 5.81 MIL/uL   Hemoglobin 9.8 (*) 13.0 - 17.0 g/dL   HCT 16.1 (*) 09.6 - 04.5 %   MCV 83.5  78.0 - 100.0 fL   MCH 28.4  26.0 - 34.0 pg   MCHC 34.0  30.0 - 36.0 g/dL   RDW 40.9 (*) 81.1 - 91.4 %   Platelets 170  150 - 400 K/uL   Neutrophils Relative 80 (*) 43 - 77 %   Neutro Abs 5.4  1.7 - 7.7 K/uL   Lymphocytes Relative 8 (*) 12 - 46 %   Lymphs Abs 0.6 (*) 0.7 - 4.0 K/uL   Monocytes Relative 11  3 - 12 %   Monocytes Absolute 0.7  0.1 - 1.0 K/uL   Eosinophils Relative 1  0 - 5 %    Eosinophils Absolute 0.1  0.0 - 0.7 K/uL   Basophils Relative 0  0 - 1 %   Basophils Absolute 0.0  0.0 - 0.1 K/uL  PHOSPHORUS     Status: Abnormal   Collection Time    03/18/12  9:40 AM      Result Value Range   Phosphorus 7.5 (*) 2.3 - 4.6 mg/dL  MAGNESIUM     Status: None   Collection Time    03/18/12  9:40 AM      Result Value Range   Magnesium 2.5  1.5 - 2.5 mg/dL  POCT I-STAT TROPONIN I     Status: None   Collection Time    03/18/12  9:49 AM      Result Value Range   Troponin i, poc 0.00  0.00 - 0.08 ng/mL   Comment 3            Comment: Due to the release kinetics of cTnI,     a negative result within the first hours     of the onset of symptoms does not rule out     myocardial infarction with certainty.     If myocardial infarction is still suspected,     repeat the test at appropriate intervals.  POCT I-STAT 3, BLOOD GAS (G3P V)     Status: Abnormal   Collection Time    03/18/12 12:20 PM      Result Value Range   pH, Ven 7.326 (*) 7.250 - 7.300   pCO2, Ven 42.4 (*) 45.0 - 50.0 mmHg   pO2, Ven 33.0  30.0 -  45.0 mmHg   Bicarbonate 22.1  20.0 - 24.0 mEq/L   TCO2 23  0 - 100 mmol/L   O2 Saturation 59.0     Acid-base deficit 4.0 (*) 0.0 - 2.0 mmol/L   Sample type VENOUS     Comment NOTIFIED PHYSICIAN    URINALYSIS, MICROSCOPIC ONLY     Status: Abnormal   Collection Time    03/18/12 12:59 PM      Result Value Range   Color, Urine YELLOW  YELLOW   APPearance CLEAR  CLEAR   Specific Gravity, Urine 1.015  1.005 - 1.030   pH 5.0  5.0 - 8.0   Glucose, UA NEGATIVE  NEGATIVE mg/dL   Hgb urine dipstick NEGATIVE  NEGATIVE   Bilirubin Urine NEGATIVE  NEGATIVE   Ketones, ur NEGATIVE  NEGATIVE mg/dL   Protein, ur >161 (*) NEGATIVE mg/dL   Urobilinogen, UA 0.2  0.0 - 1.0 mg/dL   Nitrite NEGATIVE  NEGATIVE   Leukocytes, UA TRACE (*) NEGATIVE   WBC, UA 0-2  <3 WBC/hpf   RBC / HPF 3-6  <3 RBC/hpf   Bacteria, UA RARE  RARE   Squamous Epithelial / LPF FEW (*) RARE    Urine-Other AMORPHOUS URATES/PHOSPHATES    CREATININE, URINE, RANDOM     Status: None   Collection Time    03/18/12 12:59 PM      Result Value Range   Creatinine, Urine 122.19    SODIUM, URINE, RANDOM     Status: None   Collection Time    03/18/12 12:59 PM      Result Value Range   Sodium, Ur 53    HEPATITIS B SURFACE ANTIGEN     Status: None   Collection Time    03/18/12  3:56 PM      Result Value Range   Hepatitis B Surface Ag NEGATIVE  NEGATIVE  CBC     Status: Abnormal   Collection Time    03/18/12  3:56 PM      Result Value Range   WBC 5.8  4.0 - 10.5 K/uL   RBC 3.29 (*) 4.22 - 5.81 MIL/uL   Hemoglobin 9.6 (*) 13.0 - 17.0 g/dL   HCT 09.6 (*) 04.5 - 40.9 %   MCV 84.2  78.0 - 100.0 fL   MCH 29.2  26.0 - 34.0 pg   MCHC 34.7  30.0 - 36.0 g/dL   RDW 81.1 (*) 91.4 - 78.2 %   Platelets 171  150 - 400 K/uL  CREATININE, SERUM     Status: Abnormal   Collection Time    03/18/12  3:56 PM      Result Value Range   Creatinine, Ser 13.84 (*) 0.50 - 1.35 mg/dL   GFR calc non Af Amer 3 (*) >90 mL/min   GFR calc Af Amer 4 (*) >90 mL/min   Comment:            The eGFR has been calculated     using the CKD EPI equation.     This calculation has not been     validated in all clinical     situations.     eGFR's persistently     <90 mL/min signify     possible Chronic Kidney Disease.  IRON AND TIBC     Status: Abnormal   Collection Time    03/18/12  3:57 PM      Result Value Range   Iron <10 (*) 42 - 135 ug/dL  TIBC Not calculated due to Iron <10.  215 - 435 ug/dL   Saturation Ratios Not calculated due to Iron <10.  20 - 55 %   UIBC 212  125 - 400 ug/dL  FERRITIN     Status: None   Collection Time    03/18/12  3:57 PM      Result Value Range   Ferritin 146  22 - 322 ng/mL  GLUCOSE, CAPILLARY     Status: Abnormal   Collection Time    03/18/12  4:41 PM      Result Value Range   Glucose-Capillary 140 (*) 70 - 99 mg/dL   Comment 1 Documented in Chart     Comment 2 Notify RN     MRSA PCR SCREENING     Status: None   Collection Time    03/18/12  8:55 PM      Result Value Range   MRSA by PCR NEGATIVE  NEGATIVE   Comment:            The GeneXpert MRSA Assay (FDA     approved for NASAL specimens     only), is one component of a     comprehensive MRSA colonization     surveillance program. It is not     intended to diagnose MRSA     infection nor to guide or     monitor treatment for     MRSA infections.  GLUCOSE, CAPILLARY     Status: Abnormal   Collection Time    03/18/12  9:14 PM      Result Value Range   Glucose-Capillary 200 (*) 70 - 99 mg/dL  RENAL FUNCTION PANEL     Status: Abnormal   Collection Time    03/19/12  6:15 AM      Result Value Range   Sodium 132 (*) 135 - 145 mEq/L   Potassium 5.1  3.5 - 5.1 mEq/L   Chloride 95 (*) 96 - 112 mEq/L   CO2 18 (*) 19 - 32 mEq/L   Glucose, Bld 166 (*) 70 - 99 mg/dL   BUN 97 (*) 6 - 23 mg/dL   Creatinine, Ser 16.10 (*) 0.50 - 1.35 mg/dL   Calcium 8.6  8.4 - 96.0 mg/dL   Phosphorus 8.0 (*) 2.3 - 4.6 mg/dL   Albumin 2.8 (*) 3.5 - 5.2 g/dL   GFR calc non Af Amer 3 (*) >90 mL/min   GFR calc Af Amer 4 (*) >90 mL/min   Comment:            The eGFR has been calculated     using the CKD EPI equation.     This calculation has not been     validated in all clinical     situations.     eGFR's persistently     <90 mL/min signify     possible Chronic Kidney Disease.  CBC     Status: Abnormal   Collection Time    03/19/12  6:15 AM      Result Value Range   WBC 6.1  4.0 - 10.5 K/uL   RBC 3.03 (*) 4.22 - 5.81 MIL/uL   Hemoglobin 8.8 (*) 13.0 - 17.0 g/dL   HCT 45.4 (*) 09.8 - 11.9 %   MCV 84.5  78.0 - 100.0 fL   MCH 29.0  26.0 - 34.0 pg   MCHC 34.4  30.0 - 36.0 g/dL   RDW 14.7 (*) 82.9 - 56.2 %   Platelets 162  150 - 400 K/uL  PROTIME-INR     Status: Abnormal   Collection Time    03/19/12  7:12 AM      Result Value Range   Prothrombin Time 16.0 (*) 11.6 - 15.2 seconds   INR 1.31  0.00 - 1.49  APTT      Status: Abnormal   Collection Time    03/19/12  7:12 AM      Result Value Range   aPTT 39 (*) 24 - 37 seconds   Comment:            IF BASELINE aPTT IS ELEVATED,     SUGGEST PATIENT RISK ASSESSMENT     BE USED TO DETERMINE APPROPRIATE     ANTICOAGULANT THERAPY.  GLUCOSE, CAPILLARY     Status: Abnormal   Collection Time    03/19/12  8:01 AM      Result Value Range   Glucose-Capillary 154 (*) 70 - 99 mg/dL   US Renal  07/22/2954  *RADIOLOGY REPORT*  Clinical Data: Acute renal failure  RENAL/URINARY TRACT ULTRASOUND COMPLETE  Comparison:  None.  Findings:  Right Kidney:  Measures 12.0 cm.  Echogenic renal parenchyma, suggesting medical renal disease.  No mass or hydronephrosis.  Left Kidney:  Measures 10.7 cm.  Echogenic renal parenchyma, suggesting medical renal disease.  9 x 7 x 7 mm interpolar cyst. No hydronephrosis.  Bladder:  Decompressed by indwelling Foley catheter.  IMPRESSION: No hydronephrosis.  Echogenic renal parenchyma, suggesting medical renal disease.   Original Report Authenticated By: Charline Bills, M.D.    Dg Chest Port 1 View  03/18/2012  *RADIOLOGY REPORT*  Clinical Data: Acute shortness of breath.  PORTABLE CHEST - 1 VIEW  Comparison: Chest radiograph performed earlier today at 09:31 a.m.  Findings: There is stable elevation of the right hemidiaphragm, with associated right basilar opacity likely reflecting atelectasis.  Pulmonary vascularity is at the upper limits of normal.  No definite pleural effusion or pneumothorax is seen.  The cardiomediastinal silhouette is borderline normal in size.  No acute osseous abnormalities are seen.  IMPRESSION: Stable elevation of the right hemidiaphragm; associated right basilar opacity likely reflects atelectasis.   Original Report Authenticated By: Tonia Ghent, M.D.    Dg Chest Port 1 View  03/18/2012  *RADIOLOGY REPORT*  Clinical Data: Exertional shortness of breath, swelling in legs and feet  PORTABLE CHEST - 1 VIEW  Comparison:  07/18/2009  Findings: Cardiomegaly.  Pulmonary vascular congestion without frank interstitial edema. No pleural effusion or pneumothorax.  Stable elevation of the right hemidiaphragm with associated right basilar atelectasis.  IMPRESSION: Cardiomegaly with pulmonary vascular congestion.  No frank interstitial edema.   Original Report Authenticated By: Charline Bills, M.D.     Review of Systems  Constitutional: Negative for fever and chills.  Respiratory: Positive for shortness of breath.   Cardiovascular: Negative for chest pain.  Gastrointestinal: Negative for nausea and vomiting.  Neurological: Positive for weakness. Negative for dizziness.    Blood pressure 137/57, pulse 54, temperature 97.9 F (36.6 C), temperature source Oral, resp. rate 18, height 5\' 10"  (1.778 m), weight 222 lb 10.6 oz (101 kg), SpO2 97.00%. Physical Exam  Constitutional: He is oriented to person, place, and time.  Cardiovascular: Normal rate, regular rhythm and normal heart sounds.   No murmur heard. Respiratory: Effort normal. He has wheezes.  GI: Soft. Bowel sounds are normal. He exhibits distension.  Musculoskeletal: Normal range of motion.  Neurological: He is alert and oriented to person, place, and time.  Psychiatric: He has a normal mood and affect. His behavior is normal. Judgment and thought content normal.     Assessment/Plan MGUS monoclonal gammopathy of unknown significance Poss MM BM bx 1 yr ago inconclusive Now scheduled for BM bx 3/7 Pt aware of procedure benefits and risks and agreeable to proceed Consent signed and in chart   TURPIN,PAMELA A 03/19/2012, 8:39 AM

## 2012-03-20 ENCOUNTER — Inpatient Hospital Stay (HOSPITAL_COMMUNITY): Payer: Medicare Other

## 2012-03-20 ENCOUNTER — Ambulatory Visit (HOSPITAL_COMMUNITY): Payer: Medicare Other

## 2012-03-20 LAB — RENAL FUNCTION PANEL
BUN: 76 mg/dL — ABNORMAL HIGH (ref 6–23)
CO2: 24 mEq/L (ref 19–32)
Calcium: 8.5 mg/dL (ref 8.4–10.5)
Chloride: 97 mEq/L (ref 96–112)
Creatinine, Ser: 12.12 mg/dL — ABNORMAL HIGH (ref 0.50–1.35)
Glucose, Bld: 147 mg/dL — ABNORMAL HIGH (ref 70–99)

## 2012-03-20 LAB — CBC
HCT: 26.7 % — ABNORMAL LOW (ref 39.0–52.0)
Hemoglobin: 8.9 g/dL — ABNORMAL LOW (ref 13.0–17.0)
MCH: 28.9 pg (ref 26.0–34.0)
MCHC: 33.3 g/dL (ref 30.0–36.0)
MCV: 86.7 fL (ref 78.0–100.0)
RBC: 3.08 MIL/uL — ABNORMAL LOW (ref 4.22–5.81)

## 2012-03-20 LAB — GLUCOSE, CAPILLARY
Glucose-Capillary: 148 mg/dL — ABNORMAL HIGH (ref 70–99)
Glucose-Capillary: 226 mg/dL — ABNORMAL HIGH (ref 70–99)
Glucose-Capillary: 192 mg/dL — ABNORMAL HIGH (ref 70–99)

## 2012-03-20 MED ORDER — DOXERCALCIFEROL 4 MCG/2ML IV SOLN
INTRAVENOUS | Status: AC
  Administered 2012-03-20: 2 ug via INTRAVENOUS
  Filled 2012-03-20: qty 2

## 2012-03-20 MED ORDER — DOXERCALCIFEROL 4 MCG/2ML IV SOLN
2.0000 ug | INTRAVENOUS | Status: DC
  Filled 2012-03-20: qty 2

## 2012-03-20 MED ORDER — HYDRALAZINE HCL 50 MG PO TABS
50.0000 mg | ORAL_TABLET | Freq: Three times a day (TID) | ORAL | Status: DC
  Administered 2012-03-20 – 2012-03-23 (×11): 50 mg via ORAL
  Filled 2012-03-20 (×15): qty 1

## 2012-03-20 NOTE — Progress Notes (Signed)
Patient ID: Jack [name], male   DOB: 1945-10-18, 67 y.o.   MRN: [id number] The patient had hemodialysis earlier this a.m. through new hemodialysis catheter Will plan an AV fistula left upper extremity early next week pending results of vein mapping

## 2012-03-20 NOTE — Progress Notes (Signed)
VASCULAR LAB PRELIMINARY  PRELIMINARY  PRELIMINARY  PRELIMINARY  Right  Upper Extremity Vein Map    Cephalic  Segment Diameter Depth Comment  1. Axilla 5.52 mm mm   2. Mid upper arm 4.57 mm mm   3. Above AC 5.32 mm mm   4. In AC 4.36 mm mm Has IV  5. Below AC mm mm IV  6. Mid forearm 2.33 mm mm   7. Wrist mm mm    mm mm    mm mm    mm mm    Basilic  Segment Diameter Depth Comment  1. Axilla mm mm   2. Mid upper arm 8.1 mm 10.8 mm Then decreases to 4.18 mm  3. Above AC 4.58 mm 12 mm   4. In AC 2.25 mm mm   5. Below AC 0.1 mm mm   6. Mid forearm mm mm   7. Wrist mm mm    mm mm    mm mm    mm mm    Left Upper Extremity Vein Map    Cephalic  Segment Diameter Depth Comment  1. Axilla 4.42 mm mm   2. Mid upper arm 3.41 mm mm   3. Above AC 4.45 mm mm Branches here.  Both branches appear viable.  First measurements are the lateral branch.  These measurements are the more medial:  2.91 mm  4. In AC 4.16 mm mm 4.07 mm  5. Below AC 2.95 mm mm 2.08 mm  6. Mid forearm 3.16 mm mm 3.7 mm  7. Wrist 2.33 mm mm 3.80mm   mm mm    mm mm    mm mm    Basilic  Segment Diameter Depth Comment  1. Axilla mm mm   2. Mid upper arm 7.99 mm 14.3 mm   3. Above AC 3.32 mm mm   4. In AC 2.49 mm mm Branch   5. Below AC 1.7 mm mm Branch   6. Mid forearm 1.75 mm mm   7. Wrist 1.7 mm mm    mm mm    mm mm    mm mm     CESTONE, HELENE, RVT 03/20/2012, 4:55 PM

## 2012-03-20 NOTE — Progress Notes (Signed)
[facility] Progress Note [place] TEAM 1 - Stepdown/ICU TEAM   Jack [name] JXB:[id number] DOB: 06/17/1945 DOA: 03/18/2012 PCP: No primary provider on file.  Brief narrative: 67 year old male patient with chronic kidney disease last documented creatinine 3.2 about one year prior. Diagnosed with MGUS January 2013. Workup included laboratory evaluation and bone marrow biopsy by hematology. At that time no evidence of multiple myeloma was found. He presented to the emergency department on the date of admission because of increasing shortness of breath progressive over 2 weeks. Laboratory data revealed markedly increased creatinine of 13. His chest x-ray revealed pulmonary vascular congestion without frank interstitial edema and no effusion or pneumothorax. He was also bradycardic likely do to poorly cleared beta blockers in the setting of renal failure. He was initially admitted to the renal floor but developed respiratory distress overnight and required transfer to the step down unit for utilization of BiPAP. Subsequently he has been weaned off of BiPAP to nasal cannula oxygen. He is currently being evaluated by nephrology and hematology.  Assessment/Plan:  CKD (chronic kidney disease) stage 5, GFR less than 15 ml/min (progressive) / Metabolic acidosis -appreciate Nephro and VVS assistance  -minimal UOP despite high dose Lasix - HD initiated 3/6 - almost 6000cc out so far (3/7) -Heme/Nephro suspect etiology is MGUS and poss MM - also diabetic nephropathy could be contributing  Acute respiratory failure with hypoxia due to Volume overload -improved after initiation of HD -cont. supportive care (oxygen) - O2 demands have decreased    Hyponatremia -due to volume overload/resolved  MGUS (monoclonal gammopathy of unknown significance) -Appreciate heme assistance -concern MGUS has evolved into MM - WU in process including plans to repeat bone marrow bx  Anemia in chronic kidney  disease/Iron deficiency -baseline hgb 2013 was 11-12; avg. around 8.8 but suspect degree of dilution from vol.overload -? Will need IV iron this admit -Aranesp per nephro  Bradycardia -rates have increased from 40's to 50's-hemodynamically stable -cont to hold BB -expect will improve after HD  Essential hypertension, benign -BP markedly elevated yesterday even after HD and still ?120 on HD so will need to adjust anti HTN meds. -Cont Hydralazine (but increase to 50 TID) and Cardura  Diabetes mellitus type 1, controlled, with complications -CBG controlled here - check HgbA1c -cont. SSI -was on Onglyza at home  Hypothyroidism -cont synthroid -TSH 1.443   DVT prophylaxis: Subcutaneous heparin Code Status: Full Family Communication: Patient Disposition Plan: Transfer to floor  Consultants: Nephrology Hematology  Procedures: Insertion cuffed hemodialysis catheter right IJ (23 cm) (03/19/12)  Antibiotics: Ancef IV preop x1 dose (03/19/12)  HPI/Subjective: Patient alert and endorses feels much better after initiation of HD. No further resp problems  Objective: Blood pressure 137/63, pulse 57, temperature 98.8 F (37.1 C), temperature source Oral, resp. rate 13, height 5\' 10"  (1.778 m), weight 97.3 kg (214 lb 8.1 oz), SpO2 98.00%.  Intake/Output Summary (Last 24 hours) at 03/20/12 1102 Last data filed at 03/20/12 0600  Gross per 24 hour  Intake    523 ml  Output   2950 ml  Net  -2427 ml    Exam: General: No acute respiratory distress Lungs: Clear to auscultation bilaterally without wheezes or crackles, diminished in bases bilaterally, 3 L nasal cannula oxygen Cardiovascular: Regular rate and rhythm without murmur gallop or rub normal S1 and S2, moderate JVD and 1+ bilateral lower extremity Abdomen: Nontender, somewhat distended but soft, bowel sounds positive, no rebound, no ascites, no appreciable mass Musculoskeletal: No significant cyanosis,  clubbing of bilateral lower  extremities Neurological: Alert and oriented x3, moves all extremities x4, cranial nerves II through XII grossly intact  Data Reviewed: Basic Metabolic Panel:  Recent Labs Lab 03/18/12 0940 03/18/12 1556 03/19/12 0615 03/20/12 0515  NA 131*  --  132* 135  K 5.1  --  5.1 4.5  CL 95*  --  95* 97  CO2 18*  --  18* 24  GLUCOSE 194*  --  166* 147*  BUN 91*  --  97* 76*  CREATININE 13.39* 13.84* 14.27* 12.12*  CALCIUM 9.0  --  8.6 8.5  MG 2.5  --   --   --   PHOS 7.5*  --  8.0* 6.8*   Liver Function Tests:  Recent Labs Lab 03/18/12 0940 03/19/12 0615 03/20/12 0515  AST 8  --   --   ALT 8  --   --   ALKPHOS 63  --   --   BILITOT 0.4  --   --   PROT 7.8  --   --   ALBUMIN 3.1* 2.8* 2.7*   CBC:  Recent Labs Lab 03/18/12 0940 03/18/12 1556 03/19/12 0615 03/20/12 0515  WBC 6.8 5.8 6.1 5.9  NEUTROABS 5.4  --   --   --   HGB 9.8* 9.6* 8.8* 8.9*  HCT 28.8* 27.7* 25.6* 26.7*  MCV 83.5 84.2 84.5 86.7  PLT 170 171 162 175   CBG:  Recent Labs Lab 03/19/12 0801 03/19/12 1007 03/19/12 1110 03/19/12 1612 03/19/12 2234  GLUCAP 154* 130* 121* 120* 306*    Recent Results (from the past 240 hour(s))  MRSA PCR SCREENING     Status: None   Collection Time    03/18/12  8:55 PM      Result Value Range Status   MRSA by PCR NEGATIVE  NEGATIVE Final   Comment:            The GeneXpert MRSA Assay (FDA     approved for NASAL specimens     only), is one component of a     comprehensive MRSA colonization     surveillance program. It is not     intended to diagnose MRSA     infection nor to guide or     monitor treatment for     MRSA infections.     Studies:  Recent x-ray studies have been reviewed in detail by the Attending Physician  Scheduled Meds:  Reviewed in detail by the Attending Physician   Junious Silk, ANP [facility] Office  [phone] Pager [phone]  On-Call/Text Page:      [email or web address]      password [email or web address]  If 7PM-7AM, please  contact night-coverage [email or web address] Password [email or web address] 03/20/2012, 11:02 AM   LOS: 2 days   I have personally examined this patient and reviewed the entire database. I have reviewed the above note, made any necessary editorial changes, and agree with its content.  Lonia Blood, MD [facility]

## 2012-03-20 NOTE — Progress Notes (Signed)
[facility] - PROGRESS NOTE Resident Note   Please see below for attending addendum to resident note.  Subjective:   Feels better this am. Less SOB and swelling. Tolerated HD well 03/19/12 In HD today   Objective:    Vital Signs:   Temp:  [97.7 F (36.5 C)-98.8 F (37.1 C)] 98.8 F (37.1 C) (03/07 0805) Pulse Rate:  [35-70] 57 (03/07 0900) Resp:  [13-22] 13 (03/07 0900) BP: (109-216)/(49-103) 152/70 mmHg (03/07 0900) SpO2:  [97 %-99 %] 98 % (03/07 0815) Weight:  [214 lb 8.1 oz (97.3 kg)-218 lb 5.9 oz (99.05 kg)] 214 lb 8.1 oz (97.3 kg) (03/07 0805) Last BM Date: 03/18/12  24-hour weight change: Weight change: -3 lb 3.2 oz (-1.45 kg)  Weight trends: Filed Weights   03/19/12 1730 03/19/12 2017 03/20/12 0805  Weight: 218 lb 5.9 oz (99.05 kg) 215 lb 13.3 oz (97.9 kg) 214 lb 8.1 oz (97.3 kg)    Intake/Output:  03/06 0701 - 03/07 0700 In: 773 [P.O.:480; I.V.:293] Out: 2950 [Urine:650]  Physical Exam:  General:  Vital signs reviewed and noted. Well developed. No acute distress ; alert, appropriate and cooperative throughout examination.   Head:  Normocephalic, atraumatic.   Eyes:  PERRL, EOMI, No signs of anemia or jaundince.   Nose:  Mucous membranes moist, not inflammed, nonerythematous.   Throat:  Oropharynx nonerythematous, no exudate appreciated.   Neck:  No deformities, masses, or tenderness noted.Supple, No carotid Bruits, ++ JVD.   Lungs:  Normal respiratory effort. Bibasilar lungs crackles noted. No wheezes.   Heart:  RRR. S1 and S2 normal without gallop, murmur, or rubs.   Abdomen:  BS normoactive. Soft, non-tender. No masses or organomegaly. Abdominal wall edema noted. + distend   Extremities:  Pitting edema 3+ up to knee level.   Neurologic:  A&O X3, CN II - XII are grossly intact. Motor strength is 5/5 in the all 4 extremities, Sensations intact to light touch, Cerebellar signs negative.   Skin:  No visible rashes, scars.     Labs: Basic  Metabolic Panel:  Recent Labs Lab 03/18/12 0940 03/18/12 1556 03/19/12 0615 03/20/12 0515  NA 131*  --  132* 135  K 5.1  --  5.1 4.5  CL 95*  --  95* 97  CO2 18*  --  18* 24  GLUCOSE 194*  --  166* 147*  BUN 91*  --  97* 76*  CREATININE 13.39* 13.84* 14.27* 12.12*  CALCIUM 9.0  --  8.6 8.5  MG 2.5  --   --   --   PHOS 7.5*  --  8.0* 6.8*    Liver Function Tests:  Recent Labs Lab 03/18/12 0940 03/19/12 0615 03/20/12 0515  AST 8  --   --   ALT 8  --   --   ALKPHOS 63  --   --   BILITOT 0.4  --   --   PROT 7.8  --   --   ALBUMIN 3.1* 2.8* 2.7*   No results found for this basename: LIPASE, AMYLASE,  in the last 168 hours No results found for this basename: AMMONIA,  in the last 168 hours  CBC:  Recent Labs Lab 03/18/12 0940 03/18/12 1556 03/19/12 0615 03/20/12 0515  WBC 6.8 5.8 6.1 5.9  NEUTROABS 5.4  --   --   --   HGB 9.8* 9.6* 8.8* 8.9*  HCT 28.8* 27.7* 25.6* 26.7*  MCV 83.5 84.2 84.5 86.7  PLT 170 171 162  175    Cardiac Enzymes: No results found for this basename: CKTOTAL, CKMB, TROPONINI,  in the last 168 hours  BNP: No components found with this basename: POCBNP,   CBG:  Recent Labs Lab 03/19/12 0801 03/19/12 1007 03/19/12 1110 03/19/12 1612 03/19/12 2234  GLUCAP 154* 130* 121* 120* 306*    Microbiology: Results for orders placed during the hospital encounter of 03/18/12  MRSA PCR SCREENING     Status: None   Collection Time    03/18/12  8:55 PM      Result Value Range Status   MRSA by PCR NEGATIVE  NEGATIVE Final   Comment:            The GeneXpert MRSA Assay (FDA     approved for NASAL specimens     only), is one component of a     comprehensive MRSA colonization     surveillance program. It is not     intended to diagnose MRSA     infection nor to guide or     monitor treatment for     MRSA infections.    Coagulation Studies:  Recent Labs  03/19/12 0712  LABPROT 16.0*  INR 1.31    Urinalysis:    Component Value  Date/Time   COLORURINE YELLOW 03/18/2012 1259   APPEARANCEUR CLEAR 03/18/2012 1259   LABSPEC 1.015 03/18/2012 1259   PHURINE 5.0 03/18/2012 1259   GLUCOSEU NEGATIVE 03/18/2012 1259   HGBUR NEGATIVE 03/18/2012 1259   BILIRUBINUR NEGATIVE 03/18/2012 1259   KETONESUR NEGATIVE 03/18/2012 1259   PROTEINUR >300* 03/18/2012 1259   UROBILINOGEN 0.2 03/18/2012 1259   NITRITE NEGATIVE 03/18/2012 1259   LEUKOCYTESUR TRACE* 03/18/2012 1259     Imaging: US Renal  03/18/2012  *RADIOLOGY REPORT*  Clinical Data: Acute renal failure  RENAL/URINARY TRACT ULTRASOUND COMPLETE  Comparison:  None.  Findings:  Right Kidney:  Measures 12.0 cm.  Echogenic renal parenchyma, suggesting medical renal disease.  No mass or hydronephrosis.  Left Kidney:  Measures 10.7 cm.  Echogenic renal parenchyma, suggesting medical renal disease.  9 x 7 x 7 mm interpolar cyst. No hydronephrosis.  Bladder:  Decompressed by indwelling Foley catheter.  IMPRESSION: No hydronephrosis.  Echogenic renal parenchyma, suggesting medical renal disease.   Original Report Authenticated By: Charline Bills, M.D.    Dg Chest Port 1 View  03/19/2012  *RADIOLOGY REPORT*  Clinical Data: Status post hemodialysis catheter placement  PORTABLE CHEST - 1 VIEW  Comparison: Preoperative chest x-ray 03/18/2012  Findings: Interval placement of a right IJ approach hemodialysis catheter.  The tip the catheter projects over the superior cavoatrial junction.  No evidence of pneumothorax.  No significant interval change in the appearance of the chest.  Inspiratory volumes are low and there is elevation of the right hemidiaphragm. Pulmonary vascular congestion bordering on mild edema with stable cardiomegaly.  Bibasilar atelectasis.  No acute osseous abnormality.  IMPRESSION:  Interval placement of a right IJ approach hemodialysis catheter with the catheter tip at the superior cavoatrial junction.  No evidence of pneumothorax.  Otherwise, unchanged chest.   Original Report Authenticated By:  Malachy Moan, M.D.    Dg Chest Port 1 View  03/18/2012  *RADIOLOGY REPORT*  Clinical Data: Acute shortness of breath.  PORTABLE CHEST - 1 VIEW  Comparison: Chest radiograph performed earlier today at 09:31 a.m.  Findings: There is stable elevation of the right hemidiaphragm, with associated right basilar opacity likely reflecting atelectasis.  Pulmonary vascularity is at the upper limits of normal.  No definite pleural effusion or pneumothorax is seen.  The cardiomediastinal silhouette is borderline normal in size.  No acute osseous abnormalities are seen.  IMPRESSION: Stable elevation of the right hemidiaphragm; associated right basilar opacity likely reflects atelectasis.   Original Report Authenticated By: Tonia Ghent, M.D.    Dg Chest Port 1 View  03/18/2012  *RADIOLOGY REPORT*  Clinical Data: Exertional shortness of breath, swelling in legs and feet  PORTABLE CHEST - 1 VIEW  Comparison: 07/18/2009  Findings: Cardiomegaly.  Pulmonary vascular congestion without frank interstitial edema. No pleural effusion or pneumothorax.  Stable elevation of the right hemidiaphragm with associated right basilar atelectasis.  IMPRESSION: Cardiomegaly with pulmonary vascular congestion.  No frank interstitial edema.   Original Report Authenticated By: Charline Bills, M.D.    Dg Fluoro Guide Cv Line-no Report  03/19/2012  CLINICAL DATA: Dialysis Catheter Placement   FLOURO GUIDE CV LINE  Fluoroscopy was utilized by the requesting physician.  No radiographic  interpretation.        Medications:    Infusions: . sodium chloride 20 mL/hr at 03/19/12 1003    Scheduled Medications: . aspirin EC  81 mg Oral Daily  . calcium acetate  667 mg Oral TID WC  . darbepoetin (ARANESP) injection - DIALYSIS  60 mcg Intravenous Q Thu-HD  . doxazosin  4 mg Oral QHS  . fentaNYL  50-100 mcg Intravenous Once  . [START ON 03/21/2012] heparin  5,000 Units Subcutaneous Q8H  . hydrALAZINE  25 mg Oral TID  . insulin aspart  0-9  Units Subcutaneous TID WC  . levothyroxine  175 mcg Oral QAC breakfast  . multivitamin  1 tablet Oral Daily  . sodium chloride  3 mL Intravenous Q12H    PRN Medications: sodium chloride, acetaminophen, fentaNYL, midazolam, sodium chloride   Assessment/ Plan:    Patient is a 67 y.o. male with a PMHx of DM, HTN, CKD, MGUS and self reported thyroid removal, who was admitted to [facility] on 03/18/2012 for evaluation of shortness of breath. Renal is consulted for A/C RF.  History of MGUS followed by oncology Dr. Zella Ball with last OV 02/28/11--" he has an evolving myeloma vs active disease causing renal dysfunction. I have proposed that we recheck labs in 2 months and begin aranesp".  History of CKD followed by Dr. Georgia Dom. Baseline BNU 22 and Cr 3.29 with GRF 29 in Dec 2013.  Renal artery doppler 02/08/11-- unremarkable study   1. Acute on chronic renal failure in the setting of CKD IV , baseline Cr 3.29  Etiology is unclear. Concerning relation with MGUS vs Multiple Myeloma.   - Renal U/S---Echogenic renal parenchyma, suggesting medical renal disease  - Hepatitis panel Negative  - PTH elevated - Renal function daily  - Immunofixation, UPEP, SPEP pending and Hematology consult for possible Myeloma  - Anemia panel--iron deficiency--consider Iron IV  - Phoslo, Aranesp - recs to start Hectorol  2 mcg IV with HD - Perm cath insertion and HD 03/19/12.  - Will clip for [facility]   2. Fluid overload, better Likely associated with A/C RF. Reports baseline weight of 202 lbs.  - HD today  3. Hyponatremia, likely associate with fluid  4. Anemia, baseline at 11  - see above  - Feraheme IV 03/19/12 - and aranesp   5. MGUS vs Multiple Myeloma  - Hematology consult  - BM biopsy by IR   6. HTN recs resume home dose of clonidine to avoid withdrawal - defer it to primary care team  7. Hematuria in foley - will d/c foley - defer to primary care team for monitoring  DVT PPX - Heparin    Length of  Stay: 2 days  Patient history and plan of care reviewed with attending, Dr. Briant Cedar.   Dede Query, MD  PGYII, Internal Medicine Resident 03/20/2012, 9:21 AM I have seen and examined this patient and agree with plan per Dr NA.  Pt seen on HD.  Perm access to be placed next week. MATTINGLY,MICHAEL T,MD 03/21/2012 10:33 AM

## 2012-03-20 NOTE — Procedures (Signed)
Pt seen on HD.  Ap 90 Vp 100.  On 2K bath.  Tolerating HD well.

## 2012-03-21 LAB — GLUCOSE, CAPILLARY
Glucose-Capillary: 113 mg/dL — ABNORMAL HIGH (ref 70–99)
Glucose-Capillary: 143 mg/dL — ABNORMAL HIGH (ref 70–99)

## 2012-03-21 LAB — RENAL FUNCTION PANEL
Albumin: 2.6 g/dL — ABNORMAL LOW (ref 3.5–5.2)
BUN: 49 mg/dL — ABNORMAL HIGH (ref 6–23)
Chloride: 99 mEq/L (ref 96–112)
Creatinine, Ser: 9.07 mg/dL — ABNORMAL HIGH (ref 0.50–1.35)
GFR calc non Af Amer: 5 mL/min — ABNORMAL LOW (ref >90)
Phosphorus: 4.6 mg/dL (ref 2.3–4.6)
Potassium: 3.9 mEq/L (ref 3.5–5.1)

## 2012-03-21 MED ORDER — CLONIDINE HCL 0.1 MG PO TABS
0.1000 mg | ORAL_TABLET | Freq: Three times a day (TID) | ORAL | Status: DC | PRN
  Filled 2012-03-21: qty 1

## 2012-03-21 MED ORDER — ISOSORBIDE MONONITRATE 15 MG HALF TABLET
15.0000 mg | ORAL_TABLET | Freq: Every day | ORAL | Status: DC
Start: 2012-03-21 — End: 2012-03-24
  Administered 2012-03-21 – 2012-03-23 (×3): 15 mg via ORAL
  Filled 2012-03-21 (×5): qty 1

## 2012-03-21 MED ORDER — NEBIVOLOL HCL 2.5 MG PO TABS
2.5000 mg | ORAL_TABLET | Freq: Every day | ORAL | Status: DC
  Administered 2012-03-21 – 2012-03-24 (×4): 2.5 mg via ORAL
  Filled 2012-03-21 (×5): qty 1

## 2012-03-21 NOTE — Progress Notes (Addendum)
[name] - PROGRESS NOTE Resident Note   Please see below for attending addendum to resident note.  Subjective:   Feels much better. No SOB. Less swelling. Tolerated HD well 03/19/12 and 03/20/12    Objective:    Vital Signs:   Temp:  [98 F (36.7 C)-100 F (37.8 C)] 100 F (37.8 C) (03/08 0437) Pulse Rate:  [57-69] 69 (03/08 0437) Resp:  [13-23] 20 (03/08 0437) BP: (100-181)/(51-82) 181/77 mmHg (03/08 0437) SpO2:  [96 %-100 %] 96 % (03/08 0437) Weight:  [207 lb 14.3 oz (94.3 kg)] 207 lb 14.3 oz (94.3 kg) (03/07 1159) Last BM Date: 03/20/12  24-hour weight change: Weight change: -3 lb 13.7 oz (-1.75 kg)  Weight trends: Filed Weights   03/19/12 2017 03/20/12 0805 03/20/12 1159  Weight: 215 lb 13.3 oz (97.9 kg) 214 lb 8.1 oz (97.3 kg) 207 lb 14.3 oz (94.3 kg)    Intake/Output:  03/07 0701 - 03/08 0700 In: 400 [P.O.:300] Out: 3965 [Urine:50]  Physical Exam:  General:  Vital signs reviewed and noted. Well developed. No acute distress ; alert, appropriate and cooperative throughout examination.   Head:  Normocephalic, atraumatic.   Eyes:  PERRL, EOMI, No signs of anemia or jaundince.   Nose:  Mucous membranes moist, not inflammed, nonerythematous.   Throat:  Oropharynx nonerythematous, no exudate appreciated.   Neck:  No deformities, masses, or tenderness noted.Supple, No carotid Bruits, no JVD.   Lungs:  Normal respiratory effort. Bibasilar lungs crackles noted. No wheezes.   Heart:  RRR. S1 and S2 normal without gallop, murmur, or rubs.   Abdomen:  BS normoactive. Soft, non-tender. No masses or organomegaly. Less abdominal wall edema noted.  Extremities:  Pitting edema 1-2+ ankles  Neurologic:  A&O X3, CN II - XII are grossly intact. Motor strength is 5/5 in the all 4 extremities, Sensations intact to light touch, Cerebellar signs negative.   Skin:  No visible rashes, scars.      Labs: Basic Metabolic Panel:  Recent Labs Lab 03/18/12 0940   03/19/12 0615 03/20/12 0515 03/21/12 0452  NA 131*  --  132* 135 137  K 5.1  --  5.1 4.5 3.9  CL 95*  --  95* 97 99  CO2 18*  --  18* 24 26  GLUCOSE 194*  --  166* 147* 156*  BUN 91*  --  97* 76* 49*  CREATININE 13.39*  < > 14.27* 12.12* 9.07*  CALCIUM 9.0  --  8.6 8.5 8.7  MG 2.5  --   --   --   --   PHOS 7.5*  --  8.0* 6.8* 4.6  < > = values in this interval not displayed.  Liver Function Tests:  Recent Labs Lab 03/18/12 0940 03/19/12 0615 03/20/12 0515 03/21/12 0452  AST 8  --   --   --   ALT 8  --   --   --   ALKPHOS 63  --   --   --   BILITOT 0.4  --   --   --   PROT 7.8  --   --   --   ALBUMIN 3.1* 2.8* 2.7* 2.6*   No results found for this basename: LIPASE, AMYLASE,  in the last 168 hours No results found for this basename: AMMONIA,  in the last 168 hours  CBC:  Recent Labs Lab 03/18/12 0940 03/18/12 1556 03/19/12 0615 03/20/12 0515  WBC 6.8 5.8 6.1 5.9  NEUTROABS 5.4  --   --   --  HGB 9.8* 9.6* 8.8* 8.9*  HCT 28.8* 27.7* 25.6* 26.7*  MCV 83.5 84.2 84.5 86.7  PLT 170 171 162 175    Cardiac Enzymes: No results found for this basename: CKTOTAL, CKMB, TROPONINI,  in the last 168 hours  BNP: No components found with this basename: POCBNP,   CBG:  Recent Labs Lab 03/19/12 2234 03/20/12 1228 03/20/12 1756 03/20/12 2051 03/21/12 0800  GLUCAP 306* 148* 226* 192* 163*    Microbiology: Results for orders placed during the hospital encounter of 03/18/12  MRSA PCR SCREENING     Status: None   Collection Time    03/18/12  8:55 PM      Result Value Range Status   MRSA by PCR NEGATIVE  NEGATIVE Final   Comment:            The GeneXpert MRSA Assay (FDA     approved for NASAL specimens     only), is one component of a     comprehensive MRSA colonization     surveillance program. It is not     intended to diagnose MRSA     infection nor to guide or     monitor treatment for     MRSA infections.    Coagulation Studies:  Recent Labs   03/19/12 0712  LABPROT 16.0*  INR 1.31    Urinalysis:    Component Value Date/Time   COLORURINE YELLOW 03/18/2012 1259   APPEARANCEUR CLEAR 03/18/2012 1259   LABSPEC 1.015 03/18/2012 1259   PHURINE 5.0 03/18/2012 1259   GLUCOSEU NEGATIVE 03/18/2012 1259   HGBUR NEGATIVE 03/18/2012 1259   BILIRUBINUR NEGATIVE 03/18/2012 1259   KETONESUR NEGATIVE 03/18/2012 1259   PROTEINUR >300* 03/18/2012 1259   UROBILINOGEN 0.2 03/18/2012 1259   NITRITE NEGATIVE 03/18/2012 1259   LEUKOCYTESUR TRACE* 03/18/2012 1259     Imaging: Dg Chest Port 1 View  03/19/2012  *RADIOLOGY REPORT*  Clinical Data: Status post hemodialysis catheter placement  PORTABLE CHEST - 1 VIEW  Comparison: Preoperative chest x-ray 03/18/2012  Findings: Interval placement of a right IJ approach hemodialysis catheter.  The tip the catheter projects over the superior cavoatrial junction.  No evidence of pneumothorax.  No significant interval change in the appearance of the chest.  Inspiratory volumes are low and there is elevation of the right hemidiaphragm. Pulmonary vascular congestion bordering on mild edema with stable cardiomegaly.  Bibasilar atelectasis.  No acute osseous abnormality.  IMPRESSION:  Interval placement of a right IJ approach hemodialysis catheter with the catheter tip at the superior cavoatrial junction.  No evidence of pneumothorax.  Otherwise, unchanged chest.   Original Report Authenticated By: Malachy Moan, M.D.    Dg Bone Survey Met  03/20/2012  *RADIOLOGY REPORT*  Clinical Data: Suspected myeloma, evaluate for lytic bone lesions  METASTATIC BONE SURVEY  Comparison: Prior chest x-ray 03/19/2012; prior CT abdomen and pelvis 01/03/2009, prior osseous survey 01/28/2011  Findings: The calvarium is intact.  No focal lytic lesion identified.  Relatively poor dentition with multiple missing maxillary teeth.  The sinuses are well-aerated.  Surgical changes of C4-C5 anterior cervical discectomy and fusion with anterior plate and vertebral  body screw construct.  No evidence of hardware complication.  There are degenerative spondylitic changes above and below the level of fixation.  There is a suggestion of a soft tissue mass creating a concave defect in the anterior aspect of the C2 vertebral body.  This appears slightly more prominent than seen on 01/28/2011.  Right IJ approach tunneled hemodialysis  access catheter.  The catheter tip projects over the right atrium. Elevation of the right hemidiaphragm.  Moderate cardiomegaly.  Atherosclerosis in the transverse aorta. Mild vascular congestion without edema.  No evidence of lytic lesion in the thoracic or lumbar spine.  There are degenerative changes and facet arthropathy scattered throughout.  The pelvis appears diffusely osteopenic.  No focal lytic lesion. Mild degenerative changes in the bilateral hip joints.  Scattered atherosclerotic vascular calcifications throughout the extremity arteries.  No focal lytic lesions in the bilateral upper or lower extremity osseous structures.  Degenerative changes in the bilateral acromioclavicular and glenohumeral joints.  IMPRESSION:  1.  No lytic lesion identified.  2.  Suggestion of a soft tissue mass anterior to the C2 vertebral body which appears slightly more prominent than seen on 01/28/2011. This may represent an artifact related to the adjacent degenerative changes.  CT scan of the cervical spine could further evaluate if clinically warranted.   Original Report Authenticated By: Malachy Moan, M.D.    Dg Fluoro Guide Cv Line-no Report  03/19/2012  CLINICAL DATA: Dialysis Catheter Placement   FLOURO GUIDE CV LINE  Fluoroscopy was utilized by the requesting physician.  No radiographic  interpretation.        Medications:    Infusions:    Scheduled Medications: . aspirin EC  81 mg Oral Daily  . calcium acetate  667 mg Oral TID WC  . darbepoetin (ARANESP) injection - DIALYSIS  60 mcg Intravenous Q Thu-HD  . doxazosin  4 mg Oral QHS  .  doxercalciferol  2 mcg Intravenous Q M,W,F-HD  . heparin  5,000 Units Subcutaneous Q8H  . hydrALAZINE  50 mg Oral TID  . insulin aspart  0-9 Units Subcutaneous TID WC  . isosorbide mononitrate  15 mg Oral Daily  . levothyroxine  175 mcg Oral QAC breakfast  . multivitamin  1 tablet Oral Daily  . nebivolol  2.5 mg Oral Daily    PRN Medications: sodium chloride, acetaminophen, sodium chloride   Assessment/ Plan:   Patient is a 67 y.o. male with a PMHx of DM, HTN, CKD, MGUS and self reported thyroid removal, who was admitted to [facility] on 03/18/2012 for evaluation of shortness of breath. Renal is consulted for A/C RF.  History of MGUS followed by oncology Dr. Zella Ball with last OV 02/28/11--" he has an evolving myeloma vs active disease causing renal dysfunction. I have proposed that we recheck labs in 2 months and begin aranesp".  History of CKD followed by Dr. Georgia Dom. Baseline BNU 22 and Cr 3.29 with GRF 29 in Dec 2013.  Renal artery doppler 02/08/11-- unremarkable study  - Renal U/S---Echogenic renal parenchyma, suggesting medical renal disease   1. Acute on chronic renal failure in the setting of CKD IV , baseline Cr 3.29   Concerning relation with MGUS vs Multiple Myeloma.   - Hepatitis panel Negative  - PTH elevated  - Renal function daily  - Immunofixation, UPEP, SPEP pending and Hematology consult for possible Myeloma  - Anemia panel--iron deficiency--consider Iron IV  - Phoslo, Aranesp, Hectorol  - Perm cath insertion and HD 03/19/12 and 3/7.  - clip for [facility]  - Vein mapping and plan an LUA AVF  2. Fluid overload, better  Likely associated with A/C RF. Reports baseline weight of 202 lbs.   3. Hyponatremia, likely associate with fluid  4. Anemia, baseline at 11  - see above  - Feraheme IV 03/19/12  - and aranesp  5. MGUS vs Multiple Myeloma  -  Hematology consult  - BM biopsy by IR   6. HTN  recs resume home dose of clonidine to avoid withdrawal  - defer it to primary care  team  7. Hematuria in foley  - will d/c foley  - defer to primary care team for monitoring  DVT PPX - Heparin       Length of Stay: 3 days  Patient history and plan of care reviewed with attending, Dr. Briant Cedar.   Dede Query, MD  PGYII, Internal Medicine Resident 03/21/2012, 9:47 AM I have seen and examined this patient and agree with plan per Dr Ian Bushman.  Feels better and breathing better.  Will see if can wait til Monday for more HD. Use prn clonidine and will see how BP responds to volume control.  Waiting for BM bx.  Working on [employer] HD spot at [facility] MATTINGLY,MICHAEL T,MD 03/21/2012 10:36 AM

## 2012-03-21 NOTE — Progress Notes (Signed)
[facility] PROGRESS NOTE Interim History: 67 year old male patient with chronic kidney disease last documented creatinine 3.2 about one year prior. Diagnosed with MGUS January 2013. Workup included laboratory evaluation and bone marrow biopsy by hematology. At that time no evidence of multiple myeloma was found. He presented to the emergency department on the date of admission because of increasing shortness of breath progressive over 2 weeks. Laboratory data revealed markedly increased creatinine of 13. His chest x-ray revealed pulmonary vascular congestion without frank interstitial edema and no effusion or pneumothorax. He was also bradycardic likely do to poorly cleared beta blockers in the setting of renal failure. He was initially admitted to the renal floor but developed respiratory distress overnight and required transfer to the step down unit for utilization of BiPAP. Subsequently he has been weaned off of BiPAP to nasal cannula oxygen. He is currently being evaluated by nephrology and hematology.    Assessment/Plan:  CKD (chronic kidney disease) stage 5, GFR less than 15 ml/min (progressive) / Metabolic acidosis  -appreciate Nephro and VVS assistance -minimal UOP despite high dose Lasix - HD initiated 3/6. -Heme/Nephro suspect etiology is MGUS and poss MM, maybe diabetic nephropathy. - SPEP, light chain and immunofixation from 03/18/2012 are pending. - Vein mapping done on 03/20/2012. - HD Diatek cath.  Acute respiratory failure with hypoxia due to Volume overload  -improved after initiation of HD   Hyponatremia  -due to volume overload/resolved with HD.  MGUS (monoclonal gammopathy of unknown significance)  - Appreciate heme assistance  - Concern MGUS has evolved into MM  -  BM Bx pending.  Anemia in chronic kidney disease/Iron deficiency  - baseline hgb 2013 was 11-12; most likely worsening renal failure. - Will need IV iron this admit  - Aranesp per nephro    Bradycardia  -Resolved  Resume low dose BB.  Essential hypertension, benign  -BP markedly elevated. -Cont Hydralazine and Cardura. - add Imdur, avoid clonidine 2/2 to bradycardia.  Diabetes mellitus type 1, controlled, with complications  -CBG controlled here - check HgbA1c  -cont. SSI  -was on Onglyza at home   Hypothyroidism  -cont synthroid -TSH 1.443   Code Status: Full  Family Communication: Patient  Disposition Plan: remains inpatient   Consultants:  Nephrology  Hematology   Procedures:  Insertion cuffed hemodialysis catheter right IJ (23 cm) (03/19/12)   Antibiotics:  Ancef IV preop x1 dose (03/19/12)   HPI/Subjective: No sob, feels good  Objective: Filed Vitals:   03/20/12 1232 03/20/12 1757 03/20/12 2102 03/21/12 0437  BP: 155/74 149/62 148/70 181/77  Pulse: 66 61 63 69  Temp: 98.3 F (36.8 C) 98.4 F (36.9 C) 98.8 F (37.1 C) 100 F (37.8 C)  TempSrc: Oral  Oral Oral  Resp: 23 20 22 20   Height:      Weight:      SpO2: 98% 100% 99% 96%    Intake/Output Summary (Last 24 hours) at 03/21/12 0756 Last data filed at 03/20/12 2150  Gross per 24 hour  Intake    400 ml  Output   3965 ml  Net  -3565 ml   Filed Weights   03/19/12 2017 03/20/12 0805 03/20/12 1159  Weight: 97.9 kg (215 lb 13.3 oz) 97.3 kg (214 lb 8.1 oz) 94.3 kg (207 lb 14.3 oz)    Exam:  General: Alert, awake, oriented x3, in no acute distress.  HEENT: No bruits, no goiter.  Heart: Regular rate and rhythm, without murmurs, rubs, gallops.  Lungs: Good air movement, bclear  to auscultation. Cath insertion clean Abdomen: Soft, nontender, nondistended, positive bowel sounds.     Data Reviewed: Basic Metabolic Panel:  Recent Labs Lab 03/18/12 0940 03/18/12 1556 03/19/12 0615 03/20/12 0515 03/21/12 0452  NA 131*  --  132* 135 137  K 5.1  --  5.1 4.5 3.9  CL 95*  --  95* 97 99  CO2 18*  --  18* 24 26  GLUCOSE 194*  --  166* 147* 156*  BUN 91*  --  97* 76* 49*   CREATININE 13.39* 13.84* 14.27* 12.12* 9.07*  CALCIUM 9.0  --  8.6 8.5 8.7  MG 2.5  --   --   --   --   PHOS 7.5*  --  8.0* 6.8* 4.6   Liver Function Tests:  Recent Labs Lab 03/18/12 0940 03/19/12 0615 03/20/12 0515 03/21/12 0452  AST 8  --   --   --   ALT 8  --   --   --   ALKPHOS 63  --   --   --   BILITOT 0.4  --   --   --   PROT 7.8  --   --   --   ALBUMIN 3.1* 2.8* 2.7* 2.6*   No results found for this basename: LIPASE, AMYLASE,  in the last 168 hours No results found for this basename: AMMONIA,  in the last 168 hours CBC:  Recent Labs Lab 03/18/12 0940 03/18/12 1556 03/19/12 0615 03/20/12 0515  WBC 6.8 5.8 6.1 5.9  NEUTROABS 5.4  --   --   --   HGB 9.8* 9.6* 8.8* 8.9*  HCT 28.8* 27.7* 25.6* 26.7*  MCV 83.5 84.2 84.5 86.7  PLT 170 171 162 175   Cardiac Enzymes: No results found for this basename: CKTOTAL, CKMB, CKMBINDEX, TROPONINI,  in the last 168 hours BNP (last 3 results)  Recent Labs  03/18/12 0940  PROBNP 12648.0*   CBG:  Recent Labs Lab 03/19/12 1612 03/19/12 2234 03/20/12 1228 03/20/12 1756 03/20/12 2051  GLUCAP 120* 306* 148* 226* 192*    Recent Results (from the past 240 hour(s))  MRSA PCR SCREENING     Status: None   Collection Time    03/18/12  8:55 PM      Result Value Range Status   MRSA by PCR NEGATIVE  NEGATIVE Final   Comment:            The GeneXpert MRSA Assay (FDA     approved for NASAL specimens     only), is one component of a     comprehensive MRSA colonization     surveillance program. It is not     intended to diagnose MRSA     infection nor to guide or     monitor treatment for     MRSA infections.     Studies: Dg Chest Port 1 View  03/19/2012  *RADIOLOGY REPORT*  Clinical Data: Status post hemodialysis catheter placement  PORTABLE CHEST - 1 VIEW  Comparison: Preoperative chest x-ray 03/18/2012  Findings: Interval placement of a right IJ approach hemodialysis catheter.  The tip the catheter projects over the  superior cavoatrial junction.  No evidence of pneumothorax.  No significant interval change in the appearance of the chest.  Inspiratory volumes are low and there is elevation of the right hemidiaphragm. Pulmonary vascular congestion bordering on mild edema with stable cardiomegaly.  Bibasilar atelectasis.  No acute osseous abnormality.  IMPRESSION:  Interval placement of a right IJ  approach hemodialysis catheter with the catheter tip at the superior cavoatrial junction.  No evidence of pneumothorax.  Otherwise, unchanged chest.   Original Report Authenticated By: Malachy Moan, M.D.    Dg Bone Survey Met  03/20/2012  *RADIOLOGY REPORT*  Clinical Data: Suspected myeloma, evaluate for lytic bone lesions  METASTATIC BONE SURVEY  Comparison: Prior chest x-ray 03/19/2012; prior CT abdomen and pelvis 01/03/2009, prior osseous survey 01/28/2011  Findings: The calvarium is intact.  No focal lytic lesion identified.  Relatively poor dentition with multiple missing maxillary teeth.  The sinuses are well-aerated.  Surgical changes of C4-C5 anterior cervical discectomy and fusion with anterior plate and vertebral body screw construct.  No evidence of hardware complication.  There are degenerative spondylitic changes above and below the level of fixation.  There is a suggestion of a soft tissue mass creating a concave defect in the anterior aspect of the C2 vertebral body.  This appears slightly more prominent than seen on 01/28/2011.  Right IJ approach tunneled hemodialysis access catheter.  The catheter tip projects over the right atrium. Elevation of the right hemidiaphragm.  Moderate cardiomegaly.  Atherosclerosis in the transverse aorta. Mild vascular congestion without edema.  No evidence of lytic lesion in the thoracic or lumbar spine.  There are degenerative changes and facet arthropathy scattered throughout.  The pelvis appears diffusely osteopenic.  No focal lytic lesion. Mild degenerative changes in the  bilateral hip joints.  Scattered atherosclerotic vascular calcifications throughout the extremity arteries.  No focal lytic lesions in the bilateral upper or lower extremity osseous structures.  Degenerative changes in the bilateral acromioclavicular and glenohumeral joints.  IMPRESSION:  1.  No lytic lesion identified.  2.  Suggestion of a soft tissue mass anterior to the C2 vertebral body which appears slightly more prominent than seen on 01/28/2011. This may represent an artifact related to the adjacent degenerative changes.  CT scan of the cervical spine could further evaluate if clinically warranted.   Original Report Authenticated By: Malachy Moan, M.D.    Dg Fluoro Guide Cv Line-no Report  03/19/2012  CLINICAL DATA: Dialysis Catheter Placement   FLOURO GUIDE CV LINE  Fluoroscopy was utilized by the requesting physician.  No radiographic  interpretation.      Scheduled Meds: . aspirin EC  81 mg Oral Daily  . calcium acetate  667 mg Oral TID WC  . darbepoetin (ARANESP) injection - DIALYSIS  60 mcg Intravenous Q Thu-HD  . doxazosin  4 mg Oral QHS  . doxercalciferol  2 mcg Intravenous Q M,W,F-HD  . heparin  5,000 Units Subcutaneous Q8H  . hydrALAZINE  50 mg Oral TID  . insulin aspart  0-9 Units Subcutaneous TID WC  . levothyroxine  175 mcg Oral QAC breakfast  . multivitamin  1 tablet Oral Daily   Continuous Infusions:    Marinda Elk  [facility] Pager [phone]. If 8PM-8AM, please contact night-coverage at [email or web address], password [employer] 03/21/2012, 7:56 AM  LOS: 3 days

## 2012-03-21 NOTE — Progress Notes (Signed)
Pt vein mapping reviewed.  Most likely access next Wednesday 03/25/12.  Vein map shows he would be candidate for possible radial cephalic or definitely left brachial cephalic fistula.  Will follow up with definitive date on Monday If discharged over weekend we will contact the patient  Plan discussed with patient today.  Fabienne Bruns, MD [facility] Office: [phone] Pager: [phone]

## 2012-03-21 NOTE — Progress Notes (Signed)
Dr. David Stall texted re: pt voided 400cc of dark, red blood. Awaiting response.

## 2012-03-22 LAB — RENAL FUNCTION PANEL
Calcium: 9.1 mg/dL (ref 8.4–10.5)
GFR calc Af Amer: 5 mL/min — ABNORMAL LOW (ref >90)
GFR calc non Af Amer: 4 mL/min — ABNORMAL LOW (ref >90)
Glucose, Bld: 148 mg/dL — ABNORMAL HIGH (ref 70–99)
Phosphorus: 5.1 mg/dL — ABNORMAL HIGH (ref 2.3–4.6)
Potassium: 4.1 mEq/L (ref 3.5–5.1)
Sodium: 139 mEq/L (ref 135–145)

## 2012-03-22 LAB — GLUCOSE, CAPILLARY
Glucose-Capillary: 191 mg/dL — ABNORMAL HIGH (ref 70–99)
Glucose-Capillary: 99 mg/dL (ref 70–99)
Glucose-Capillary: 121 mg/dL — ABNORMAL HIGH (ref 70–99)

## 2012-03-22 NOTE — Progress Notes (Signed)
Pt. was 96% on RA @ rest. Pt. was 94% on RA with ambulation. Ambulated from room to nurse's desk and back to room. Pt. slightly short of breath but tolerated.

## 2012-03-22 NOTE — Progress Notes (Addendum)
S: feels good.  No uremic sxs O:BP 149/66  Pulse 72  Temp(Src) 99 F (37.2 C) (Oral)  Resp 18  Ht 5\' 10"  (1.778 m)  Wt 93.8 kg (206 lb 12.7 oz)  BMI 29.67 kg/m2  SpO2 99%  Intake/Output Summary (Last 24 hours) at 03/22/12 1033 Last data filed at 03/22/12 0600  Gross per 24 hour  Intake    300 ml  Output    801 ml  Net   -501 ml   Weight change: -3.5 kg (-7 lb 11.5 oz) ZOX:WRUEA and alert CVS:RRR Resp:clear Abd:+ BS NTND Ext:tr-1+ edema NEURO:CNI Ox3 no asterixis   . aspirin EC  81 mg Oral Daily  . calcium acetate  667 mg Oral TID WC  . darbepoetin (ARANESP) injection - DIALYSIS  60 mcg Intravenous Q Thu-HD  . doxazosin  4 mg Oral QHS  . doxercalciferol  2 mcg Intravenous Q M,W,F-HD  . heparin  5,000 Units Subcutaneous Q8H  . hydrALAZINE  50 mg Oral TID  . insulin aspart  0-9 Units Subcutaneous TID WC  . isosorbide mononitrate  15 mg Oral Daily  . levothyroxine  175 mcg Oral QAC breakfast  . multivitamin  1 tablet Oral Daily  . nebivolol  2.5 mg Oral Daily   Dg Bone Survey Met  03/20/2012  *RADIOLOGY REPORT*  Clinical Data: Suspected myeloma, evaluate for lytic bone lesions  METASTATIC BONE SURVEY  Comparison: Prior chest x-ray 03/19/2012; prior CT abdomen and pelvis 01/03/2009, prior osseous survey 01/28/2011  Findings: The calvarium is intact.  No focal lytic lesion identified.  Relatively poor dentition with multiple missing maxillary teeth.  The sinuses are well-aerated.  Surgical changes of C4-C5 anterior cervical discectomy and fusion with anterior plate and vertebral body screw construct.  No evidence of hardware complication.  There are degenerative spondylitic changes above and below the level of fixation.  There is a suggestion of a soft tissue mass creating a concave defect in the anterior aspect of the C2 vertebral body.  This appears slightly more prominent than seen on 01/28/2011.  Right IJ approach tunneled hemodialysis access catheter.  The catheter tip projects  over the right atrium. Elevation of the right hemidiaphragm.  Moderate cardiomegaly.  Atherosclerosis in the transverse aorta. Mild vascular congestion without edema.  No evidence of lytic lesion in the thoracic or lumbar spine.  There are degenerative changes and facet arthropathy scattered throughout.  The pelvis appears diffusely osteopenic.  No focal lytic lesion. Mild degenerative changes in the bilateral hip joints.  Scattered atherosclerotic vascular calcifications throughout the extremity arteries.  No focal lytic lesions in the bilateral upper or lower extremity osseous structures.  Degenerative changes in the bilateral acromioclavicular and glenohumeral joints.  IMPRESSION:  1.  No lytic lesion identified.  2.  Suggestion of a soft tissue mass anterior to the C2 vertebral body which appears slightly more prominent than seen on 01/28/2011. This may represent an artifact related to the adjacent degenerative changes.  CT scan of the cervical spine could further evaluate if clinically warranted.   Original Report Authenticated By: Malachy Moan, M.D.    BMET    Component Value Date/Time   NA 139 03/22/2012 0525   K 4.1 03/22/2012 0525   CL 101 03/22/2012 0525   CO2 26 03/22/2012 0525   GLUCOSE 148* 03/22/2012 0525   BUN 50* 03/22/2012 0525   CREATININE 10.48* 03/22/2012 0525   CALCIUM 9.1 03/22/2012 0525   GFRNONAA 4* 03/22/2012 0525   GFRAA 5* 03/22/2012  0525   CBC    Component Value Date/Time   WBC 5.9 03/20/2012 0515   WBC 4.9 05/02/2011 0816   RBC 3.08* 03/20/2012 0515   RBC 4.08* 05/02/2011 0816   HGB 8.9* 03/20/2012 0515   HGB 11.0* 05/02/2011 0816   HCT 26.7* 03/20/2012 0515   HCT 34.8* 05/02/2011 0816   PLT 175 03/20/2012 0515   PLT 197 05/02/2011 0816   MCV 86.7 03/20/2012 0515   MCV 85.3 05/02/2011 0816   MCH 28.9 03/20/2012 0515   MCH 27.0* 05/02/2011 0816   MCHC 33.3 03/20/2012 0515   MCHC 31.6* 05/02/2011 0816   RDW 15.5 03/20/2012 0515   RDW 15.8* 05/02/2011 0816   LYMPHSABS 0.6* 03/18/2012 0940    LYMPHSABS 1.6 05/02/2011 0816   MONOABS 0.7 03/18/2012 0940   MONOABS 0.4 05/02/2011 0816   EOSABS 0.1 03/18/2012 0940   EOSABS 0.1 05/02/2011 0816   BASOSABS 0.0 03/18/2012 0940   BASOSABS 0.0 05/02/2011 0816     Assessment: 1. ESRD 2. Pulm edema, improved 3.  MGUS, ? Myeloma 4. Anemia, on aranesp 5. Sec HPTH  On hectorol.  To get BM Bx 6. Hematuria, most likel from foley trauma, improving  Plan: 1.  He has outpt spot at [facility] 2nd shift 2. Note plans for access on wed 3.  Will wait til Tuesday for next HD... ? If this will be inpt or out pt?  If gets BM bx tomorrow am then could be DC'd and FU at [facility] on Tuesday and return for outpt access placement.  This is all pending that fact that he no longer requires O2, so will DC O2 and check sats at rest and with ambulation.   MATTINGLY,MICHAEL T

## 2012-03-22 NOTE — Progress Notes (Signed)
[facility] PROGRESS NOTE Interim History: 67 year old male patient with chronic kidney disease last documented creatinine 3.2 about one year prior. Diagnosed with MGUS January 2013. Workup included laboratory evaluation and bone marrow biopsy by hematology. At that time no evidence of multiple myeloma was found. He presented to the emergency department on the date of admission because of increasing shortness of breath progressive over 2 weeks. Laboratory data revealed markedly increased creatinine of 13. His chest x-ray revealed pulmonary vascular congestion without frank interstitial edema and no effusion or pneumothorax. He was also bradycardic likely do to poorly cleared beta blockers in the setting of renal failure. He was initially admitted to the renal floor but developed respiratory distress overnight and required transfer to the step down unit for utilization of BiPAP. Subsequently he has been weaned off of BiPAP to nasal cannula oxygen. He is currently being evaluated by nephrology and hematology.   Assessment/Plan:  CKD (chronic kidney disease) stage 5, GFR less than 15 ml/min (progressive) / Metabolic acidosis  -appreciate Nephro and VVS assistance - HD initiated 3/6. -Heme/Nephro suspect etiology is MGUS and poss MM, maybe diabetic nephropathy. - SPEP, light chain and immunofixation from 03/18/2012 are pending. - Vein mapping done on 03/20/2012. Fistula on 03/25/2012. - HD Diatek cath.  Acute respiratory failure with hypoxia due to Volume overload  -improved after initiation of HD   Hyponatremia  -due to volume overload/resolved with HD.  Anemia in chronic kidney disease/Iron deficiency  - baseline hgb 2013 was 11-12; most likely worsening renal failure. - Will need IV iron this admit  - Aranesp per nephro   Bradycardia  -Resolved  Resume low dose BB.  Essential hypertension, benign  - BP markedly elevated. - Cont Hydralazine and Cardura. - add Imdur, avoid  clonidine 2/2 to bradycardia.  Diabetes mellitus type 1, controlled, with complications  -CBG controlled here - check HgbA1c  -cont. SSI  -was on Onglyza at home   Hypothyroidism  -cont synthroid -TSH 1.443   Code Status: Full  Family Communication: Patient  Disposition Plan: remains inpatient   Consultants:  Nephrology  Hematology   Procedures:  Insertion cuffed hemodialysis catheter right IJ (23 cm) (03/19/12)   Antibiotics:  Ancef IV preop x1 dose (03/19/12)   HPI/Subjective:  feels better.  Objective: Filed Vitals:   03/20/12 2102 03/21/12 0437 03/21/12 1519 03/21/12 2141  BP: 148/70 181/77 153/56 149/66  Pulse: 63 69 63 72  Temp: 98.8 F (37.1 C) 100 F (37.8 C) 98.4 F (36.9 C) 99 F (37.2 C)  TempSrc: Oral Oral Oral Oral  Resp: 22 20 18    Height:      Weight:    93.8 kg (206 lb 12.7 oz)  SpO2: 99% 96% 98% 99%    Intake/Output Summary (Last 24 hours) at 03/22/12 0921 Last data filed at 03/22/12 0600  Gross per 24 hour  Intake    300 ml  Output    801 ml  Net   -501 ml   Filed Weights   03/20/12 0805 03/20/12 1159 03/21/12 2141  Weight: 97.3 kg (214 lb 8.1 oz) 94.3 kg (207 lb 14.3 oz) 93.8 kg (206 lb 12.7 oz)    Exam:  General: Alert, awake, oriented x3, in no acute distress.  HEENT: No bruits, no goiter.  Heart: Regular rate and rhythm, without murmurs, rubs, gallops.  Lungs: Good air movement, bclear to auscultation. Cath insertion clean Abdomen: Soft, nontender, nondistended, positive bowel sounds.     Data Reviewed: Basic Metabolic Panel:  Recent Labs Lab 03/18/12 0940 03/18/12 1556 03/19/12 0615 03/20/12 0515 03/21/12 0452 03/22/12 0525  NA 131*  --  132* 135 137 139  K 5.1  --  5.1 4.5 3.9 4.1  CL 95*  --  95* 97 99 101  CO2 18*  --  18* 24 26 26   GLUCOSE 194*  --  166* 147* 156* 148*  BUN 91*  --  97* 76* 49* 50*  CREATININE 13.39* 13.84* 14.27* 12.12* 9.07* 10.48*  CALCIUM 9.0  --  8.6 8.5 8.7 9.1  MG 2.5  --   --   --    --   --   PHOS 7.5*  --  8.0* 6.8* 4.6 5.1*   Liver Function Tests:  Recent Labs Lab 03/18/12 0940 03/19/12 0615 03/20/12 0515 03/21/12 0452 03/22/12 0525  AST 8  --   --   --   --   ALT 8  --   --   --   --   ALKPHOS 63  --   --   --   --   BILITOT 0.4  --   --   --   --   PROT 7.8  --   --   --   --   ALBUMIN 3.1* 2.8* 2.7* 2.6* 2.6*   No results found for this basename: LIPASE, AMYLASE,  in the last 168 hours No results found for this basename: AMMONIA,  in the last 168 hours CBC:  Recent Labs Lab 03/18/12 0940 03/18/12 1556 03/19/12 0615 03/20/12 0515  WBC 6.8 5.8 6.1 5.9  NEUTROABS 5.4  --   --   --   HGB 9.8* 9.6* 8.8* 8.9*  HCT 28.8* 27.7* 25.6* 26.7*  MCV 83.5 84.2 84.5 86.7  PLT 170 171 162 175   Cardiac Enzymes: No results found for this basename: CKTOTAL, CKMB, CKMBINDEX, TROPONINI,  in the last 168 hours BNP (last 3 results)  Recent Labs  03/18/12 0940  PROBNP 12648.0*   CBG:  Recent Labs Lab 03/21/12 0800 03/21/12 1234 03/21/12 1719 03/21/12 2149 03/22/12 0758  GLUCAP 163* 113* 143* 166* 154*    Recent Results (from the past 240 hour(s))  MRSA PCR SCREENING     Status: None   Collection Time    03/18/12  8:55 PM      Result Value Range Status   MRSA by PCR NEGATIVE  NEGATIVE Final   Comment:            The GeneXpert MRSA Assay (FDA     approved for NASAL specimens     only), is one component of a     comprehensive MRSA colonization     surveillance program. It is not     intended to diagnose MRSA     infection nor to guide or     monitor treatment for     MRSA infections.     Studies: Dg Bone Survey Met  03/20/2012  *RADIOLOGY REPORT*  Clinical Data: Suspected myeloma, evaluate for lytic bone lesions  METASTATIC BONE SURVEY  Comparison: Prior chest x-ray 03/19/2012; prior CT abdomen and pelvis 01/03/2009, prior osseous survey 01/28/2011  Findings: The calvarium is intact.  No focal lytic lesion identified.  Relatively poor  dentition with multiple missing maxillary teeth.  The sinuses are well-aerated.  Surgical changes of C4-C5 anterior cervical discectomy and fusion with anterior plate and vertebral body screw construct.  No evidence of hardware complication.  There are degenerative spondylitic changes above and below  the level of fixation.  There is a suggestion of a soft tissue mass creating a concave defect in the anterior aspect of the C2 vertebral body.  This appears slightly more prominent than seen on 01/28/2011.  Right IJ approach tunneled hemodialysis access catheter.  The catheter tip projects over the right atrium. Elevation of the right hemidiaphragm.  Moderate cardiomegaly.  Atherosclerosis in the transverse aorta. Mild vascular congestion without edema.  No evidence of lytic lesion in the thoracic or lumbar spine.  There are degenerative changes and facet arthropathy scattered throughout.  The pelvis appears diffusely osteopenic.  No focal lytic lesion. Mild degenerative changes in the bilateral hip joints.  Scattered atherosclerotic vascular calcifications throughout the extremity arteries.  No focal lytic lesions in the bilateral upper or lower extremity osseous structures.  Degenerative changes in the bilateral acromioclavicular and glenohumeral joints.  IMPRESSION:  1.  No lytic lesion identified.  2.  Suggestion of a soft tissue mass anterior to the C2 vertebral body which appears slightly more prominent than seen on 01/28/2011. This may represent an artifact related to the adjacent degenerative changes.  CT scan of the cervical spine could further evaluate if clinically warranted.   Original Report Authenticated By: Malachy Moan, M.D.     Scheduled Meds: . aspirin EC  81 mg Oral Daily  . calcium acetate  667 mg Oral TID WC  . darbepoetin (ARANESP) injection - DIALYSIS  60 mcg Intravenous Q Thu-HD  . doxazosin  4 mg Oral QHS  . doxercalciferol  2 mcg Intravenous Q M,W,F-HD  . heparin  5,000 Units  Subcutaneous Q8H  . hydrALAZINE  50 mg Oral TID  . insulin aspart  0-9 Units Subcutaneous TID WC  . isosorbide mononitrate  15 mg Oral Daily  . levothyroxine  175 mcg Oral QAC breakfast  . multivitamin  1 tablet Oral Daily  . nebivolol  2.5 mg Oral Daily   Continuous Infusions:    Marinda Elk  [facility] Pager [phone]. If 8PM-8AM, please contact night-coverage at [email or web address], password [facility] 03/22/2012, 9:21 AM  LOS: 4 days

## 2012-03-23 ENCOUNTER — Encounter (HOSPITAL_COMMUNITY): Payer: Self-pay | Admitting: Vascular Surgery

## 2012-03-23 ENCOUNTER — Inpatient Hospital Stay (HOSPITAL_COMMUNITY): Payer: Medicare Other

## 2012-03-23 LAB — PROTEIN ELECTROPHORESIS, SERUM
Alpha-2-Globulin: 9.4 % (ref 7.1–11.8)
Beta Globulin: 4 % — ABNORMAL LOW (ref 4.7–7.2)
Gamma Globulin: 9.2 % — ABNORMAL LOW (ref 11.1–18.8)
M-Spike, %: 1.67 g/dL

## 2012-03-23 LAB — RENAL FUNCTION PANEL
Albumin: 2.7 g/dL — ABNORMAL LOW (ref 3.5–5.2)
Calcium: 9.2 mg/dL (ref 8.4–10.5)
GFR calc Af Amer: 5 mL/min — ABNORMAL LOW (ref >90)
GFR calc non Af Amer: 4 mL/min — ABNORMAL LOW (ref >90)
Phosphorus: 4.3 mg/dL (ref 2.3–4.6)
Potassium: 3.9 mEq/L (ref 3.5–5.1)
Sodium: 137 mEq/L (ref 135–145)

## 2012-03-23 LAB — IMMUNOFIXATION ELECTROPHORESIS
IgA: 95 mg/dL (ref 68–379)
IgG (Immunoglobin G), Serum: 2300 mg/dL — ABNORMAL HIGH (ref 650–1600)
IgM, Serum: 8 mg/dL — ABNORMAL LOW (ref 41–251)

## 2012-03-23 LAB — GLUCOSE, CAPILLARY
Glucose-Capillary: 148 mg/dL — ABNORMAL HIGH (ref 70–99)
Glucose-Capillary: 165 mg/dL — ABNORMAL HIGH (ref 70–99)

## 2012-03-23 LAB — CBC
Hemoglobin: 9.5 g/dL — ABNORMAL LOW (ref 13.0–17.0)
MCH: 28.8 pg (ref 26.0–34.0)
RBC: 3.3 MIL/uL — ABNORMAL LOW (ref 4.22–5.81)

## 2012-03-23 LAB — BONE MARROW EXAM: Bone Marrow Exam: 171

## 2012-03-23 MED ORDER — MIDAZOLAM HCL 2 MG/2ML IJ SOLN
INTRAMUSCULAR | Status: AC | PRN
  Administered 2012-03-23: 0.5 mg via INTRAVENOUS
  Administered 2012-03-23: 1 mg via INTRAVENOUS

## 2012-03-23 MED ORDER — ZOLPIDEM TARTRATE 5 MG PO TABS
5.0000 mg | ORAL_TABLET | Freq: Every evening | ORAL | Status: DC | PRN
  Administered 2012-03-23: 5 mg via ORAL
  Filled 2012-03-23: qty 1

## 2012-03-23 MED ORDER — DOXERCALCIFEROL 4 MCG/2ML IV SOLN
2.0000 ug | INTRAVENOUS | Status: DC
  Administered 2012-03-24: 2 ug via INTRAVENOUS
  Filled 2012-03-23: qty 2

## 2012-03-23 MED ORDER — RENA-VITE PO TABS
1.0000 | ORAL_TABLET | Freq: Every day | ORAL | Status: DC
  Filled 2012-03-23: qty 1

## 2012-03-23 MED ORDER — CEFAZOLIN SODIUM 1-5 GM-% IV SOLN
1.0000 g | INTRAVENOUS | Status: DC
  Filled 2012-03-23: qty 50

## 2012-03-23 MED ORDER — CEFAZOLIN SODIUM-DEXTROSE 2-3 GM-% IV SOLR
2.0000 g | INTRAVENOUS | Status: DC
  Filled 2012-03-23: qty 50

## 2012-03-23 MED ORDER — FENTANYL CITRATE 0.05 MG/ML IJ SOLN
INTRAMUSCULAR | Status: AC | PRN
  Administered 2012-03-23: 50 ug via INTRAVENOUS
  Administered 2012-03-23: 25 ug via INTRAVENOUS

## 2012-03-23 NOTE — Progress Notes (Signed)
[facility] PROGRESS NOTE Interim History: 67 year old male patient with chronic kidney disease last documented creatinine 3.2 about one year prior. Diagnosed with MGUS January 2013. Workup included laboratory evaluation and bone marrow biopsy by hematology. At that time no evidence of multiple myeloma was found. He presented to the emergency department on the date of admission because of increasing shortness of breath progressive over 2 weeks. Laboratory data revealed markedly increased creatinine of 13. His chest x-ray revealed pulmonary vascular congestion without frank interstitial edema and no effusion or pneumothorax. He was also bradycardic likely do to poorly cleared beta blockers in the setting of renal failure. He was initially admitted to the renal floor but developed respiratory distress overnight and required transfer to the step down unit for utilization of BiPAP. Subsequently he has been weaned off of BiPAP to nasal cannula oxygen. He is currently being evaluated by nephrology and hematology.   Assessment/Plan:  CKD (chronic kidney disease) stage 5, GFR less than 15 ml/min (progressive) / Metabolic acidosis  - HD initiated 3/6. Ct guided Bx 3.10.2014 by IR. - Heme/Nephro suspect etiology is MGUS and poss MM, maybe diabetic nephropathy. - SPEP, light chain and immunofixation from 03/18/2012 - Vein mapping done on 03/20/2012. Fistula on 03/25/2012. - HD Diatek cath 3.5.2014.  Acute respiratory failure with hypoxia due to Volume overload  -improved after initiation of HD   Hyponatremia  -due to volume overload/resolved with HD.  Anemia in chronic kidney disease/Iron deficiency  - baseline hgb 2013 was 11-12; most likely worsening renal failure. - Will need IV iron this admit  - Aranesp per nephro   Bradycardia  -Resolved  Resume low dose BB.  Essential hypertension, benign  - BP markedly elevated. - Cont Hydralazine and Cardura. - add Imdur, avoid clonidine 2/2 to  bradycardia.  Diabetes mellitus type 1, controlled, with complications  -CBG controlled here - check HgbA1c  -cont. SSI  -was on Onglyza at home   Hypothyroidism  -cont synthroid -TSH 1.443   Code Status: Full  Family Communication: Patient  Disposition Plan: remains inpatient   Consultants:  Nephrology  Hematology   Procedures:  Insertion cuffed hemodialysis catheter right IJ (23 cm) (03/19/12)   Antibiotics:  Ancef IV preop x1 dose (03/19/12)   HPI/Subjective:  feels better.  Objective: Filed Vitals:   03/21/12 2141 03/22/12 1100 03/22/12 2017 03/23/12 0614  BP: 149/66 177/68 166/73 154/73  Pulse: 72 71 70 68  Temp: 99 F (37.2 C) 98.5 F (36.9 C) 98.7 F (37.1 C) 98.6 F (37 C)  TempSrc: Oral Oral Oral Oral  Resp:   17 16  Height:      Weight: 93.8 kg (206 lb 12.7 oz)  94.8 kg (208 lb 15.9 oz)   SpO2: 99% 96% 97% 96%    Intake/Output Summary (Last 24 hours) at 03/23/12 0806 Last data filed at 03/23/12 0600  Gross per 24 hour  Intake    420 ml  Output      0 ml  Net    420 ml   Filed Weights   03/20/12 1159 03/21/12 2141 03/22/12 2017  Weight: 94.3 kg (207 lb 14.3 oz) 93.8 kg (206 lb 12.7 oz) 94.8 kg (208 lb 15.9 oz)    Exam:  General: Alert, awake, oriented x3, in no acute distress.  HEENT: No bruits, no goiter.  Heart: Regular rate and rhythm, without murmurs, rubs, gallops.  Lungs: Good air movement, bclear to auscultation. Cath insertion clean Abdomen: Soft, nontender, nondistended, positive bowel sounds.  Data Reviewed: Basic Metabolic Panel:  Recent Labs Lab 03/18/12 0940  03/19/12 0615 03/20/12 0515 03/21/12 0452 03/22/12 0525 03/23/12 0535  NA 131*  --  132* 135 137 139 137  K 5.1  --  5.1 4.5 3.9 4.1 3.9  CL 95*  --  95* 97 99 101 98  CO2 18*  --  18* 24 26 26 23   GLUCOSE 194*  --  166* 147* 156* 148* 137*  BUN 91*  --  97* 76* 49* 50* 50*  CREATININE 13.39*  < > 14.27* 12.12* 9.07* 10.48* 11.09*  CALCIUM 9.0  --  8.6  8.5 8.7 9.1 9.2  MG 2.5  --   --   --   --   --   --   PHOS 7.5*  --  8.0* 6.8* 4.6 5.1* 4.3  < > = values in this interval not displayed. Liver Function Tests:  Recent Labs Lab 03/18/12 0940 03/19/12 0615 03/20/12 0515 03/21/12 0452 03/22/12 0525 03/23/12 0535  AST 8  --   --   --   --   --   ALT 8  --   --   --   --   --   ALKPHOS 63  --   --   --   --   --   BILITOT 0.4  --   --   --   --   --   PROT 7.8  --   --   --   --   --   ALBUMIN 3.1* 2.8* 2.7* 2.6* 2.6* 2.7*   No results found for this basename: LIPASE, AMYLASE,  in the last 168 hours No results found for this basename: AMMONIA,  in the last 168 hours CBC:  Recent Labs Lab 03/18/12 0940 03/18/12 1556 03/19/12 0615 03/20/12 0515  WBC 6.8 5.8 6.1 5.9  NEUTROABS 5.4  --   --   --   HGB 9.8* 9.6* 8.8* 8.9*  HCT 28.8* 27.7* 25.6* 26.7*  MCV 83.5 84.2 84.5 86.7  PLT 170 171 162 175   Cardiac Enzymes: No results found for this basename: CKTOTAL, CKMB, CKMBINDEX, TROPONINI,  in the last 168 hours BNP (last 3 results)  Recent Labs  03/18/12 0940  PROBNP 12648.0*   CBG:  Recent Labs Lab 03/21/12 2149 03/22/12 0758 03/22/12 1219 03/22/12 1721 03/22/12 2009  GLUCAP 166* 154* 191* 99 121*    Recent Results (from the past 240 hour(s))  MRSA PCR SCREENING     Status: None   Collection Time    03/18/12  8:55 PM      Result Value Range Status   MRSA by PCR NEGATIVE  NEGATIVE Final   Comment:            The GeneXpert MRSA Assay (FDA     approved for NASAL specimens     only), is one component of a     comprehensive MRSA colonization     surveillance program. It is not     intended to diagnose MRSA     infection nor to guide or     monitor treatment for     MRSA infections.     Studies: No results found.  Scheduled Meds: . aspirin EC  81 mg Oral Daily  . calcium acetate  667 mg Oral TID WC  . darbepoetin (ARANESP) injection - DIALYSIS  60 mcg Intravenous Q Thu-HD  . doxazosin  4 mg Oral QHS   . doxercalciferol  2 mcg  Intravenous Q M,W,F-HD  . heparin  5,000 Units Subcutaneous Q8H  . hydrALAZINE  50 mg Oral TID  . insulin aspart  0-9 Units Subcutaneous TID WC  . isosorbide mononitrate  15 mg Oral Daily  . levothyroxine  175 mcg Oral QAC breakfast  . multivitamin  1 tablet Oral Daily  . nebivolol  2.5 mg Oral Daily   Continuous Infusions:    Marinda Elk  [facility] Pager [phone]. If 8PM-8AM, please contact night-coverage at [email or web address], password [facility] 03/23/2012, 8:06 AM  LOS: 5 days

## 2012-03-23 NOTE — Progress Notes (Signed)
[facility] - PROGRESS NOTE Resident Note   Please see below for attending addendum to resident note.  Subjective:   Feels much better. No SOB. Less swelling. Tolerated HD well 03/19/12 and 03/20/12    Objective:    Vital Signs:   Temp:  [98.5 F (36.9 C)-98.7 F (37.1 C)] 98.6 F (37 C) (03/10 1610) Pulse Rate:  [68-71] 68 (03/10 0614) Resp:  [16-17] 16 (03/10 0614) BP: (154-177)/(68-73) 154/73 mmHg (03/10 0614) SpO2:  [96 %-97 %] 96 % (03/10 0614) Weight:  [208 lb 15.9 oz (94.8 kg)] 208 lb 15.9 oz (94.8 kg) (03/09 2017) Last BM Date: 03/22/12  24-hour weight change: Weight change: 2 lb 3.3 oz (1 kg)  Weight trends: Filed Weights   03/20/12 1159 03/21/12 2141 03/22/12 2017  Weight: 207 lb 14.3 oz (94.3 kg) 206 lb 12.7 oz (93.8 kg) 208 lb 15.9 oz (94.8 kg)    Intake/Output:  03/09 0701 - 03/10 0700 In: 420 [P.O.:420] Out: -   Physical Exam:  General:  Vital signs reviewed and noted. Well developed. No acute distress ; alert, appropriate and cooperative throughout examination.   Head:  Normocephalic, atraumatic.   Eyes:  PERRL, EOMI, No signs of anemia or jaundince.   Nose:  Mucous membranes moist, not inflammed, nonerythematous.   Throat:  Oropharynx nonerythematous, no exudate appreciated.   Neck:  No deformities, masses, or tenderness noted.Supple, No carotid Bruits, no JVD.   Lungs:  Normal respiratory effort. Bibasilar lungs clear. No wheezes or rhonchi.   Heart:  RRR. S1 and S2 normal without gallop, murmur, or rubs.   Abdomen:  BS normoactive. Soft, non-tender. No masses or organomegaly.  Less abdominal wall edema noted.   Extremities:  trace edema ankles   Neurologic:  A&O X3, CN II - XII are grossly intact. Motor strength is 5/5 in the all 4 extremities, Sensations intact to light touch, Cerebellar signs negative.   Skin:  No visible rashes, scars.       Labs: Basic Metabolic Panel:  Recent Labs Lab 03/18/12 0940  03/21/12 0452  03/22/12 0525 03/23/12 0535  NA 131*  < > 137 139 137  K 5.1  < > 3.9 4.1 3.9  CL 95*  < > 99 101 98  CO2 18*  < > 26 26 23   GLUCOSE 194*  < > 156* 148* 137*  BUN 91*  < > 49* 50* 50*  CREATININE 13.39*  < > 9.07* 10.48* 11.09*  CALCIUM 9.0  < > 8.7 9.1 9.2  MG 2.5  --   --   --   --   PHOS 7.5*  < > 4.6 5.1* 4.3  < > = values in this interval not displayed.  Liver Function Tests:  Recent Labs Lab 03/18/12 0940  03/21/12 0452 03/22/12 0525 03/23/12 0535  AST 8  --   --   --   --   ALT 8  --   --   --   --   ALKPHOS 63  --   --   --   --   BILITOT 0.4  --   --   --   --   PROT 7.8  --   --   --   --   ALBUMIN 3.1*  < > 2.6* 2.6* 2.7*  < > = values in this interval not displayed. No results found for this basename: LIPASE, AMYLASE,  in the last 168 hours No results found for this basename: AMMONIA,  in the last 168 hours  CBC:  Recent Labs Lab 03/18/12 0940 03/18/12 1556 03/19/12 0615 03/20/12 0515  WBC 6.8 5.8 6.1 5.9  NEUTROABS 5.4  --   --   --   HGB 9.8* 9.6* 8.8* 8.9*  HCT 28.8* 27.7* 25.6* 26.7*  MCV 83.5 84.2 84.5 86.7  PLT 170 171 162 175    Cardiac Enzymes: No results found for this basename: CKTOTAL, CKMB, TROPONINI,  in the last 168 hours  BNP: No components found with this basename: POCBNP,   CBG:  Recent Labs Lab 03/22/12 0758 03/22/12 1219 03/22/12 1721 03/22/12 2009 03/23/12 0733  GLUCAP 154* 191* 99 121* 148*    Microbiology: Results for orders placed during the hospital encounter of 03/18/12  MRSA PCR SCREENING     Status: None   Collection Time    03/18/12  8:55 PM      Result Value Range Status   MRSA by PCR NEGATIVE  NEGATIVE Final   Comment:            The GeneXpert MRSA Assay (FDA     approved for NASAL specimens     only), is one component of a     comprehensive MRSA colonization     surveillance program. It is not     intended to diagnose MRSA     infection nor to guide or     monitor treatment for     MRSA  infections.    Coagulation Studies: No results found for this basename: LABPROT, INR,  in the last 72 hours  Urinalysis:    Component Value Date/Time   COLORURINE YELLOW 03/18/2012 1259   APPEARANCEUR CLEAR 03/18/2012 1259   LABSPEC 1.015 03/18/2012 1259   PHURINE 5.0 03/18/2012 1259   GLUCOSEU NEGATIVE 03/18/2012 1259   HGBUR NEGATIVE 03/18/2012 1259   BILIRUBINUR NEGATIVE 03/18/2012 1259   KETONESUR NEGATIVE 03/18/2012 1259   PROTEINUR >300* 03/18/2012 1259   UROBILINOGEN 0.2 03/18/2012 1259   NITRITE NEGATIVE 03/18/2012 1259   LEUKOCYTESUR TRACE* 03/18/2012 1259     Imaging: No results found.    Medications:    Infusions:    Scheduled Medications: . aspirin EC  81 mg Oral Daily  . calcium acetate  667 mg Oral TID WC  . darbepoetin (ARANESP) injection - DIALYSIS  60 mcg Intravenous Q Thu-HD  . doxazosin  4 mg Oral QHS  . doxercalciferol  2 mcg Intravenous Q M,W,F-HD  . heparin  5,000 Units Subcutaneous Q8H  . hydrALAZINE  50 mg Oral TID  . insulin aspart  0-9 Units Subcutaneous TID WC  . isosorbide mononitrate  15 mg Oral Daily  . levothyroxine  175 mcg Oral QAC breakfast  . multivitamin  1 tablet Oral Daily  . nebivolol  2.5 mg Oral Daily    PRN Medications: sodium chloride, acetaminophen, cloNIDine, sodium chloride   Assessment/ Plan:    Patient is a 67 y.o. male with a PMHx of DM, HTN, CKD, MGUS and self reported thyroid removal, who was admitted to [facility] on 03/18/2012 for evaluation of shortness of breath. Renal is consulted for A/C RF.  History of MGUS followed by oncology Dr. Zella Ball with last OV 02/28/11--" he has an evolving myeloma vs active disease causing renal dysfunction. I have proposed that we recheck labs in 2 months and begin aranesp".  History of CKD followed by Dr. Georgia Dom. Baseline BUN 22 and Cr 3.29 with GRF 29 in Dec 2013.  Renal artery doppler 02/08/11-- unremarkable study  -  Renal U/S---Echogenic renal parenchyma, suggesting medical renal disease   1. Acute  on chronic renal failure in the setting of CKD IV , baseline Cr 3.29  Concerning relation with MGUS vs Multiple Myeloma.   - Hepatitis panel Negative  - PTH elevated  - Renal function daily  - Immunofixation, UPEP, SPEP pending and Hematology consult for possible Myeloma   - Phoslo, Aranesp, Hectorol  - Perm cath insertion and HD 03/19/12 and 3/7.  - clip for [facility] --outpt spot at [facility] 2nd shift - Vein mapping and plan an LUA AVF on Wed.   2. Fluid overload, better  Likely associated with A/C RF. Reports baseline weight of 202 lbs.  - 96% on RA @ rest. Pt. was 94% on RA with ambulation  3. Hyponatremia, likely associate with fluid   4. Anemia, baseline at 11  - see above  - Feraheme IV 03/19/12  - and aranesp   5. MGUS vs Multiple Myeloma  - Hematology consult  - BM biopsy by IR today   6. HTN  - clonidine PRN added - defer it to primary care team   7. Hematuria in foley  - will d/c foley  - defer to primary care team for monitoring   DVT PPX - Heparin     Length of Stay: 5 days  Patient history and plan of care reviewed with attending, Dr. Allena Katz.   Dede Query, MD  PGYII, Internal Medicine Resident 03/23/2012, 8:39 AM  I have personally seen and examined this patient and agree with the assessment/plan as outlined above by Dede Query MD (PGY2). Mr. Siverson is now on HD on a TTS schedule and next HD planned for tomorrow. S/P BM Bx today and planned for AVF Wednesday. Jeter Tomey K.,MD 03/23/2012 9:32 AM

## 2012-03-23 NOTE — Progress Notes (Signed)
Utilization review completed.  

## 2012-03-23 NOTE — Progress Notes (Signed)
Reviewed chart For CT guided BM Bx today

## 2012-03-23 NOTE — Progress Notes (Signed)
Pt for left arm AVF on Wednesday by Dr Hart Rochester Preop orders for NPO, consent  Fabienne Bruns, MD [facility] Office: [phone] Pager: [phone]

## 2012-03-23 NOTE — Progress Notes (Signed)
Patient ID: Joselito G Helminiak, male   DOB: 04/16/1945, 67 y.o.   MRN: [id number] Vascular Surgery Progress Note  Subjective: acute and chronic renal failure-started on hemodialysis  Objective:  Filed Vitals:   03/23/12 0614  BP: 154/73  Pulse: 68  Temp: 98.6 F (37 C)  Resp: 16       Labs:  Recent Labs Lab 03/21/12 0452 03/22/12 0525 03/23/12 0535  CREATININE 9.07* 10.48* 11.09*    Recent Labs Lab 03/21/12 0452 03/22/12 0525 03/23/12 0535  NA 137 139 137  K 3.9 4.1 3.9  CL 99 101 98  CO2 26 26 23  BUN 49* 50* 50*  CREATININE 9.07* 10.48* 11.09*  GLUCOSE 156* 148* 137*  CALCIUM 8.7 9.1 9.2    Recent Labs Lab 03/18/12 1556 03/19/12 0615 03/20/12 0515  WBC 5.8 6.1 5.9  HGB 9.6* 8.8* 8.9*  HCT 27.7* 25.6* 26.7*  PLT 171 162 175    Recent Labs Lab 03/19/12 0712  INR 1.31    I/O last 3 completed shifts: In: 480 [P.O.:480] Out: -   Imaging: No results found.  Assessment/Plan:  POD #5   LOS: 5 days  s/p Procedure(s): INSERTION OF DIALYSIS CATHETER  Plan left brachial cephalic or radial cephalic AV fistula on Wednesday-early a.m.   Zoeie Ritter, MD 03/23/2012 9:02 AM           

## 2012-03-23 NOTE — Procedures (Signed)
SUCCESSFUL RT ILIAC BM ASP AND CORE BX NO COMP STABLE PATH PENDING FUL REPORT IN PACS

## 2012-03-24 ENCOUNTER — Encounter (HOSPITAL_COMMUNITY): Payer: Self-pay | Admitting: Pharmacy Technician

## 2012-03-24 ENCOUNTER — Other Ambulatory Visit: Payer: Self-pay | Admitting: *Deleted

## 2012-03-24 ENCOUNTER — Other Ambulatory Visit: Payer: Self-pay | Admitting: Oncology

## 2012-03-24 ENCOUNTER — Telehealth: Payer: Self-pay | Admitting: Oncology

## 2012-03-24 LAB — GLUCOSE, CAPILLARY: Glucose-Capillary: 133 mg/dL — ABNORMAL HIGH (ref 70–99)

## 2012-03-24 LAB — RENAL FUNCTION PANEL
Albumin: 2.8 g/dL — ABNORMAL LOW (ref 3.5–5.2)
CO2: 25 mEq/L (ref 19–32)
Calcium: 9.1 mg/dL (ref 8.4–10.5)
Chloride: 102 mEq/L (ref 96–112)
GFR calc Af Amer: 5 mL/min — ABNORMAL LOW (ref >90)
GFR calc Af Amer: 5 mL/min — ABNORMAL LOW (ref >90)
GFR calc non Af Amer: 4 mL/min — ABNORMAL LOW (ref >90)
GFR calc non Af Amer: 4 mL/min — ABNORMAL LOW (ref >90)
Glucose, Bld: 161 mg/dL — ABNORMAL HIGH (ref 70–99)
Phosphorus: 4.3 mg/dL (ref 2.3–4.6)
Potassium: 4 mEq/L (ref 3.5–5.1)
Sodium: 138 mEq/L (ref 135–145)
Sodium: 139 mEq/L (ref 135–145)

## 2012-03-24 LAB — CBC
Hemoglobin: 9.2 g/dL — ABNORMAL LOW (ref 13.0–17.0)
Platelets: 307 10*3/uL (ref 150–400)
RBC: 3.22 MIL/uL — ABNORMAL LOW (ref 4.22–5.81)
WBC: 5.2 10*3/uL (ref 4.0–10.5)

## 2012-03-24 MED ORDER — CALCIUM ACETATE 667 MG PO CAPS: 667.0000 mg | ORAL_CAPSULE | Freq: Three times a day (TID) | ORAL | Status: DC

## 2012-03-24 MED ORDER — HYDRALAZINE HCL 100 MG PO TABS: 100.0000 mg | ORAL_TABLET | Freq: Three times a day (TID) | ORAL | Status: DC

## 2012-03-24 MED ORDER — DOXAZOSIN MESYLATE 8 MG PO TABS: 8.0000 mg | ORAL_TABLET | Freq: Every day | ORAL | Status: DC

## 2012-03-24 MED ORDER — INSULIN GLARGINE 100 UNIT/ML ~~LOC~~ SOLN: 5.0000 [IU] | Freq: Every day | SUBCUTANEOUS | Status: DC

## 2012-03-24 MED ORDER — ISOSORBIDE MONONITRATE ER 30 MG PO TB24
30.0000 mg | ORAL_TABLET | Freq: Every day | ORAL | Status: DC
  Administered 2012-03-24: 30 mg via ORAL
  Filled 2012-03-24 (×2): qty 1

## 2012-03-24 MED ORDER — RENA-VITE PO TABS: 1.0000 | ORAL_TABLET | Freq: Every day | ORAL | Status: DC

## 2012-03-24 MED ORDER — DOXAZOSIN MESYLATE 8 MG PO TABS
8.0000 mg | ORAL_TABLET | Freq: Every day | ORAL | Status: DC
  Filled 2012-03-24: qty 1

## 2012-03-24 MED ORDER — DEXTROSE 5 % IV SOLN
1.5000 g | INTRAVENOUS | Status: DC
  Filled 2012-03-24: qty 1.5

## 2012-03-24 MED ORDER — SAXAGLIPTIN HCL 5 MG PO TABS: 2.5000 mg | ORAL_TABLET | Freq: Every day | ORAL | Status: DC

## 2012-03-24 MED ORDER — DARBEPOETIN ALFA-POLYSORBATE 60 MCG/0.3ML IJ SOLN: 60.0000 ug | INTRAMUSCULAR | Status: DC

## 2012-03-24 MED ORDER — DOXERCALCIFEROL 4 MCG/2ML IV SOLN: 2.0000 ug | INTRAVENOUS | Status: DC

## 2012-03-24 MED ORDER — HYDRALAZINE HCL 50 MG PO TABS
100.0000 mg | ORAL_TABLET | Freq: Three times a day (TID) | ORAL | Status: DC
  Administered 2012-03-24: 100 mg via ORAL
  Filled 2012-03-24 (×3): qty 2

## 2012-03-24 MED ORDER — ISOSORBIDE MONONITRATE ER 30 MG PO TB24: 30.0000 mg | ORAL_TABLET | Freq: Every day | ORAL | Status: DC

## 2012-03-24 MED ORDER — NEBIVOLOL HCL 2.5 MG PO TABS: 2.5000 mg | ORAL_TABLET | Freq: Every day | ORAL | Status: DC

## 2012-03-24 MED ORDER — DOXERCALCIFEROL 4 MCG/2ML IV SOLN
INTRAVENOUS | Status: AC
  Filled 2012-03-24: qty 2

## 2012-03-24 NOTE — Progress Notes (Signed)
Patient discharged with daughter. Patient discharged with discharge instructions and prescriptions. Patient was given education on a renal diet. Patient was told about outpatient fistula placement procedure tomorrow. He was told to be NPO at midnight and arrive at 0630. Patient was told to contact doctor with questions and concerns. Patient was stable upon discharge.

## 2012-03-24 NOTE — Procedures (Signed)
Patient seen on Hemodialysis. QB 400, UF goal 3L. BP 207/107 Treatment adjusted as needed.  Zetta Bills MD [facility]. Office # [phone] Pager # [phone] 8:20 AM

## 2012-03-24 NOTE — Telephone Encounter (Signed)
C/D 03/24/12 for appt. 03/30/12

## 2012-03-24 NOTE — Progress Notes (Signed)
Pt will be discharged after HD per Dr. Allena Katz.  I have called Darel Hong at our office and left message for the pt to be outpt for surgery tomorrow.  Doreatha Massed 03/24/2012 7:46 AM

## 2012-03-24 NOTE — Discharge Summary (Signed)
Physician Discharge Summary  Jack [name] AOZ:[id number] DOB: 07/27/1945 DOA: 03/18/2012  PCP: No primary Denice Cardon on file.  Admit date: 03/18/2012 Discharge date: 03/24/2012  Time spent: 30 minutes  Recommendations for Outpatient Follow-up:  1. folow up with oncology  Discharge Diagnoses:  Active Problems:   MGUS (monoclonal gammopathy of unknown significance)   CKD (chronic kidney disease) stage 5, GFR less than 15 ml/min (progressive)   Bradycardia   Essential hypertension, benign   Diabetes mellitus type 1, controlled, with complications   Acute respiratory failure with hypoxia   Volume overload   Anemia in chronic kidney disease   Iron deficiency   Metabolic acidosis   Hyponatremia   Hypothyroidism   Discharge Condition: stable  Diet recommendation: renal diet  Filed Weights   03/22/12 2017 03/23/12 2100 03/24/12 0645  Weight: 94.8 kg (208 lb 15.9 oz) 94.799 kg (208 lb 15.9 oz) 91.5 kg (201 lb 11.5 oz)    History of present illness:  67 y.o. male with PMH significant for CKD last cr at 3.2, MGUS , Diabetes, who presents to ED complaining of worsening shortness of breath for last 2 weeks prior to admission. He relates dyspnea at rest and on exertion. Describe orthopnea. He denies chest pain, nausea, vomiting, diarrhea. He saw Dr Donnie Coffin last year and was told that he didn't have multiple myeloma.    Hospital Course:  Acute on CKD (chronic kidney disease) stage 5, GFR less than 15 ml/min (progressive) / Metabolic acidosis  - HD Diatek cath placed3.5.2014.  - HD initiated 3/6, will cont. TTS Ct guided Bx 3.10.2014 by IR pending. Will be follow up by oncology. - Heme/Nephro suspect etiology is MGUS and poss MM, with additional diabetic nephropathy.  - SPEP show elevated free kappa and Lamda light chain, and immunofixation from 03/18/2012.  - Vein mapping done on 03/20/2012. Fistula on 03/25/2012 as an outpatient.   Acute respiratory failure with hypoxia due to Volume  overload  -improved after initiation of HD   Hyponatremia  -due to volume overload/resolved with HD.   Anemia in chronic kidney disease/Iron deficiency  - Baseline hgb 2013 was 11-12; most likely 22 worsening renal failure.  - Will need IV iron.  - Aranesp per nephro. - vitamins as an outpatient  Bradycardia  - Resolved, avoid clonidine or other rate controlling meds. bedside nebivolol.  - Resume low dose BB.   Essential hypertension, benign  - BP markedly elevated.  - Increase Hydralazine, imduer and Cardura.  - Further titration as an outpatient.   Diabetes mellitus type 1, controlled, with complications  -CBG controlled here - 8.8HgbA1c  -was on Onglyza at home.  Hypothyroidism  -cont synthroid -TSH 1.443   Procedures:  BM Ct guided Bx 3.10.2014 )  Consultations:  Renal  oncolgy  Discharge Exam: Filed Vitals:   03/24/12 0730 03/24/12 0800 03/24/12 0830 03/24/12 0900  BP: 175/102 188/101 192/77 185/88  Pulse: 60 70 61 57  Temp:      TempSrc:      Resp:      Height:      Weight:      SpO2:        See progress note. Discharge Instructions  Discharge Orders   Future Orders Complete By Expires     Diet - low sodium heart healthy  As directed     Increase activity slowly  As directed         Medication List    STOP taking these medications  amLODipine 10 MG tablet  Commonly known as:  NORVASC     cloNIDine 0.2 MG tablet  Commonly known as:  CATAPRES     furosemide 80 MG tablet  Commonly known as:  LASIX     spironolactone 25 MG tablet  Commonly known as:  ALDACTONE      TAKE these medications       aspirin EC 81 MG tablet  Take 81 mg by mouth daily.     calcitRIOL 0.25 MCG capsule  Commonly known as:  ROCALTROL  Take 0.25 mcg by mouth daily.     calcium acetate 667 MG capsule  Commonly known as:  PHOSLO  Take 1 capsule (667 mg total) by mouth 3 (three) times daily with meals.     darbepoetin 60 MCG/0.3ML Soln  Commonly  known as:  ARANESP  Inject 0.3 mLs (60 mcg total) into the vein every Thursday with hemodialysis.     doxazosin 8 MG tablet  Commonly known as:  CARDURA  Take 1 tablet (8 mg total) by mouth at bedtime.     doxercalciferol 4 MCG/2ML injection  Commonly known as:  HECTOROL  Inject 1 mL (2 mcg total) into the vein Every Tuesday,Thursday,and Saturday with dialysis.     hydrALAZINE 100 MG tablet  Commonly known as:  APRESOLINE  Take 1 tablet (100 mg total) by mouth 3 (three) times daily.     isosorbide mononitrate 30 MG 24 hr tablet  Commonly known as:  IMDUR  Take 1 tablet (30 mg total) by mouth daily.     levothyroxine 175 MCG tablet  Commonly known as:  SYNTHROID, LEVOTHROID  Take 175 mcg by mouth daily.     multivitamin Tabs tablet  Take 1 tablet by mouth at bedtime.     nebivolol 2.5 MG tablet  Commonly known as:  BYSTOLIC  Take 1 tablet (2.5 mg total) by mouth daily.     saxagliptin HCl 5 MG Tabs tablet  Commonly known as:  ONGLYZA  Take 0.5 tablets (2.5 mg total) by mouth daily.          The results of significant diagnostics from this hospitalization (including imaging, microbiology, ancillary and laboratory) are listed below for reference.    Significant Diagnostic Studies: US Renal  03/18/2012  *RADIOLOGY REPORT*  Clinical Data: Acute renal failure  RENAL/URINARY TRACT ULTRASOUND COMPLETE  Comparison:  None.  Findings:  Right Kidney:  Measures 12.0 cm.  Echogenic renal parenchyma, suggesting medical renal disease.  No mass or hydronephrosis.  Left Kidney:  Measures 10.7 cm.  Echogenic renal parenchyma, suggesting medical renal disease.  9 x 7 x 7 mm interpolar cyst. No hydronephrosis.  Bladder:  Decompressed by indwelling Foley catheter.  IMPRESSION: No hydronephrosis.  Echogenic renal parenchyma, suggesting medical renal disease.   Original Report Authenticated By: Charline Bills, M.D.    Ct Biopsy  03/23/2012  *RADIOLOGY REPORT*  Clinical Data:  Renal failure,  hypertension, diabetes, anemia, myeloma  CT GUIDED RIGHT ILIAC BONE MARROW ASPIRATION AND CORE BIOPSY  Date:  03/23/2012 09:00:00  Radiologist:  M. Ruel Favors, M.D.  Medications:  1.5 mg Versed, 75 mcg Fentanyl  Guidance:  CT  Sedation time:  15 minutes  Contrast volume:  None.  Complications:  No immediate  PROCEDURE/FINDINGS:  Informed consent was obtained from the patient following explanation of the procedure, risks, benefits and alternatives. The patient understands, agrees and consents for the procedure. All questions were addressed.  A time out was performed.  The patient  was positioned prone and noncontrast localization CT was performed of the pelvis to demonstrate the iliac marrow spaces.  Maximal barrier sterile technique utilized including caps, mask, sterile gowns, sterile gloves, large sterile drape, hand hygiene, and betadine prep.  Under sterile conditions and local anesthesia, an 11 gauge coaxial bone biopsy needle was advanced into the right iliac marrow space. Needle position was confirmed with CT imaging. Initially, bone marrow aspiration was performed. Next, the 11 gauge outer cannula was utilized to obtain a right iliac bone marrow core biopsy. Needle was removed. Hemostasis was obtained with compression. The patient tolerated the procedure well. Samples were prepared with the cytotechnologist. No immediate complications.  IMPRESSION: CT guided right iliac bone marrow aspiration and core biopsy.   Original Report Authenticated By: Judie Petit. Miles Costain, M.D.    Dg Chest Port 1 View  03/19/2012  *RADIOLOGY REPORT*  Clinical Data: Status post hemodialysis catheter placement  PORTABLE CHEST - 1 VIEW  Comparison: Preoperative chest x-ray 03/18/2012  Findings: Interval placement of a right IJ approach hemodialysis catheter.  The tip the catheter projects over the superior cavoatrial junction.  No evidence of pneumothorax.  No significant interval change in the appearance of the chest.  Inspiratory volumes are  low and there is elevation of the right hemidiaphragm. Pulmonary vascular congestion bordering on mild edema with stable cardiomegaly.  Bibasilar atelectasis.  No acute osseous abnormality.  IMPRESSION:  Interval placement of a right IJ approach hemodialysis catheter with the catheter tip at the superior cavoatrial junction.  No evidence of pneumothorax.  Otherwise, unchanged chest.   Original Report Authenticated By: Malachy Moan, M.D.    Dg Chest Port 1 View  03/18/2012  *RADIOLOGY REPORT*  Clinical Data: Acute shortness of breath.  PORTABLE CHEST - 1 VIEW  Comparison: Chest radiograph performed earlier today at 09:31 a.m.  Findings: There is stable elevation of the right hemidiaphragm, with associated right basilar opacity likely reflecting atelectasis.  Pulmonary vascularity is at the upper limits of normal.  No definite pleural effusion or pneumothorax is seen.  The cardiomediastinal silhouette is borderline normal in size.  No acute osseous abnormalities are seen.  IMPRESSION: Stable elevation of the right hemidiaphragm; associated right basilar opacity likely reflects atelectasis.   Original Report Authenticated By: Tonia Ghent, M.D.    Dg Chest Port 1 View  03/18/2012  *RADIOLOGY REPORT*  Clinical Data: Exertional shortness of breath, swelling in legs and feet  PORTABLE CHEST - 1 VIEW  Comparison: 07/18/2009  Findings: Cardiomegaly.  Pulmonary vascular congestion without frank interstitial edema. No pleural effusion or pneumothorax.  Stable elevation of the right hemidiaphragm with associated right basilar atelectasis.  IMPRESSION: Cardiomegaly with pulmonary vascular congestion.  No frank interstitial edema.   Original Report Authenticated By: Charline Bills, M.D.    Dg Bone Survey Met  03/20/2012  *RADIOLOGY REPORT*  Clinical Data: Suspected myeloma, evaluate for lytic bone lesions  METASTATIC BONE SURVEY  Comparison: Prior chest x-ray 03/19/2012; prior CT abdomen and pelvis 01/03/2009, prior  osseous survey 01/28/2011  Findings: The calvarium is intact.  No focal lytic lesion identified.  Relatively poor dentition with multiple missing maxillary teeth.  The sinuses are well-aerated.  Surgical changes of C4-C5 anterior cervical discectomy and fusion with anterior plate and vertebral body screw construct.  No evidence of hardware complication.  There are degenerative spondylitic changes above and below the level of fixation.  There is a suggestion of a soft tissue mass creating a concave defect in the anterior aspect of the C2  vertebral body.  This appears slightly more prominent than seen on 01/28/2011.  Right IJ approach tunneled hemodialysis access catheter.  The catheter tip projects over the right atrium. Elevation of the right hemidiaphragm.  Moderate cardiomegaly.  Atherosclerosis in the transverse aorta. Mild vascular congestion without edema.  No evidence of lytic lesion in the thoracic or lumbar spine.  There are degenerative changes and facet arthropathy scattered throughout.  The pelvis appears diffusely osteopenic.  No focal lytic lesion. Mild degenerative changes in the bilateral hip joints.  Scattered atherosclerotic vascular calcifications throughout the extremity arteries.  No focal lytic lesions in the bilateral upper or lower extremity osseous structures.  Degenerative changes in the bilateral acromioclavicular and glenohumeral joints.  IMPRESSION:  1.  No lytic lesion identified.  2.  Suggestion of a soft tissue mass anterior to the C2 vertebral body which appears slightly more prominent than seen on 01/28/2011. This may represent an artifact related to the adjacent degenerative changes.  CT scan of the cervical spine could further evaluate if clinically warranted.   Original Report Authenticated By: Malachy Moan, M.D.    Dg Fluoro Guide Cv Line-no Report  03/19/2012  CLINICAL DATA: Dialysis Catheter Placement   FLOURO GUIDE CV LINE  Fluoroscopy was utilized by the requesting  physician.  No radiographic  interpretation.      Microbiology: Recent Results (from the past 240 hour(s))  MRSA PCR SCREENING     Status: None   Collection Time    03/18/12  8:55 PM      Result Value Range Status   MRSA by PCR NEGATIVE  NEGATIVE Final   Comment:            The GeneXpert MRSA Assay (FDA     approved for NASAL specimens     only), is one component of a     comprehensive MRSA colonization     surveillance program. It is not     intended to diagnose MRSA     infection nor to guide or     monitor treatment for     MRSA infections.     Labs: Basic Metabolic Panel:  Recent Labs Lab 03/18/12 0940  03/21/12 0452 03/22/12 0525 03/23/12 0535 03/24/12 0425 03/24/12 0657  NA 131*  < > 137 139 137 138 139  K 5.1  < > 3.9 4.1 3.9 4.0 4.0  CL 95*  < > 99 101 98 101 102  CO2 18*  < > 26 26 23 25 25   GLUCOSE 194*  < > 156* 148* 137* 150* 161*  BUN 91*  < > 49* 50* 50* 51* 50*  CREATININE 13.39*  < > 9.07* 10.48* 11.09* 11.38* 11.42*  CALCIUM 9.0  < > 8.7 9.1 9.2 9.1 9.2  MG 2.5  --   --   --   --   --   --   PHOS 7.5*  < > 4.6 5.1* 4.3 4.3 4.4  < > = values in this interval not displayed. Liver Function Tests:  Recent Labs Lab 03/18/12 0940  03/21/12 0452 03/22/12 0525 03/23/12 0535 03/24/12 0425 03/24/12 0657  AST 8  --   --   --   --   --   --   ALT 8  --   --   --   --   --   --   ALKPHOS 63  --   --   --   --   --   --   BILITOT  0.4  --   --   --   --   --   --   PROT 7.8  --   --   --   --   --   --   ALBUMIN 3.1*  < > 2.6* 2.6* 2.7* 2.8* 2.9*  < > = values in this interval not displayed. No results found for this basename: LIPASE, AMYLASE,  in the last 168 hours No results found for this basename: AMMONIA,  in the last 168 hours CBC:  Recent Labs Lab 03/18/12 0940 03/18/12 1556 03/19/12 0615 03/20/12 0515 03/23/12 1329 03/24/12 0657  WBC 6.8 5.8 6.1 5.9 5.4 5.2  NEUTROABS 5.4  --   --   --   --   --   HGB 9.8* 9.6* 8.8* 8.9* 9.5* 9.2*   HCT 28.8* 27.7* 25.6* 26.7* 29.2* 28.6*  MCV 83.5 84.2 84.5 86.7 88.5 88.8  PLT 170 171 162 175 327 307   Cardiac Enzymes: No results found for this basename: CKTOTAL, CKMB, CKMBINDEX, TROPONINI,  in the last 168 hours BNP: BNP (last 3 results)  Recent Labs  03/18/12 0940  PROBNP 12648.0*   CBG:  Recent Labs Lab 03/22/12 2009 03/23/12 0733 03/23/12 1144 03/23/12 1659 03/23/12 2103  GLUCAP 121* 148* 165* 156* 198*       Signed:  FELIZ ORTIZ, ABRAHAM  [facility] 03/24/2012, 9:09 AM

## 2012-03-24 NOTE — Telephone Encounter (Signed)
LVOM for dtr to return call in re to father hosp f/u appt.

## 2012-03-24 NOTE — Progress Notes (Signed)
Jack [name] - PROGRESS NOTE Resident Note   Please see below for attending addendum to resident note.  Subjective:   Feels better. No SOB. Less edema. Had BM biopsy done 3/10 and tolerated well.    Objective:    Vital Signs:   Temp:  [97.6 F (36.4 C)-98.9 F (37.2 C)] 97.6 F (36.4 C) (03/11 0645) Pulse Rate:  [58-70] 58 (03/11 0700) Resp:  [17-19] 18 (03/11 0645) BP: (145-185)/(64-93) 185/84 mmHg (03/11 0700) SpO2:  [94 %-99 %] 96 % (03/11 0700) Weight:  [201 lb 11.5 oz (91.5 kg)-208 lb 15.9 oz (94.799 kg)] 201 lb 11.5 oz (91.5 kg) (03/11 0645) Last BM Date: 03/23/12  24-hour weight change: Weight change: -0 oz (-0.001 kg)  Weight trends: Filed Weights   03/22/12 2017 03/23/12 2100 03/24/12 0645  Weight: 208 lb 15.9 oz (94.8 kg) 208 lb 15.9 oz (94.799 kg) 201 lb 11.5 oz (91.5 kg)    Intake/Output:  03/10 0701 - 03/11 0700 In: 840 [P.O.:840] Out: 675 [Urine:675]  Physical Exam:  General:  Vital signs reviewed and noted. Well developed. No acute distress ; alert, appropriate and cooperative throughout examination.   Head:  Normocephalic, atraumatic.   Eyes:  PERRL, EOMI, No signs of anemia or jaundince.   Nose:  Mucous membranes moist, not inflammed, nonerythematous.   Throat:  Oropharynx nonerythematous, no exudate appreciated.   Neck:  No deformities, masses, or tenderness noted.Supple, No carotid Bruits, no JVD.   Lungs:  Normal respiratory effort. Bibasilar lungs clear. No wheezes or rhonchi.   Heart:  RRR. S1 and S2 normal without gallop, murmur, or rubs.   Abdomen:  BS normoactive. Soft, non-tender. No masses or organomegaly.  Less abdominal wall edema noted.   Extremities:  trace edema ankles   Neurologic:  A&O X3, CN II - XII are grossly intact. Motor strength is 5/5 in the all 4 extremities, Sensations intact to light touch, Cerebellar signs negative.   Skin:  No visible rashes, scars.        Labs: Basic Metabolic Panel:  Recent  Labs Lab 03/18/12 0940  03/22/12 0525 03/23/12 0535 03/24/12 0425  NA 131*  < > 139 137 138  K 5.1  < > 4.1 3.9 4.0  CL 95*  < > 101 98 101  CO2 18*  < > 26 23 25   GLUCOSE 194*  < > 148* 137* 150*  BUN 91*  < > 50* 50* 51*  CREATININE 13.39*  < > 10.48* 11.09* 11.38*  CALCIUM 9.0  < > 9.1 9.2 9.1  MG 2.5  --   --   --   --   PHOS 7.5*  < > 5.1* 4.3 4.3  < > = values in this interval not displayed.  Liver Function Tests:  Recent Labs Lab 03/18/12 0940  03/22/12 0525 03/23/12 0535 03/24/12 0425  AST 8  --   --   --   --   ALT 8  --   --   --   --   ALKPHOS 63  --   --   --   --   BILITOT 0.4  --   --   --   --   PROT 7.8  --   --   --   --   ALBUMIN 3.1*  < > 2.6* 2.7* 2.8*  < > = values in this interval not displayed. No results found for this basename: LIPASE, AMYLASE,  in the last 168 hours No results found  for this basename: AMMONIA,  in the last 168 hours  CBC:  Recent Labs Lab 03/18/12 0940  03/20/12 0515 03/23/12 1329 03/24/12 0657  WBC 6.8  < > 5.9 5.4 5.2  NEUTROABS 5.4  --   --   --   --   HGB 9.8*  < > 8.9* 9.5* 9.2*  HCT 28.8*  < > 26.7* 29.2* 28.6*  MCV 83.5  < > 86.7 88.5 88.8  PLT 170  < > 175 327 307  < > = values in this interval not displayed.  Cardiac Enzymes: No results found for this basename: CKTOTAL, CKMB, TROPONINI,  in the last 168 hours  BNP: No components found with this basename: POCBNP,   CBG:  Recent Labs Lab 03/22/12 2009 03/23/12 0733 03/23/12 1144 03/23/12 1659 03/23/12 2103  GLUCAP 121* 148* 165* 156* 198*    Microbiology: Results for orders placed during the hospital encounter of 03/18/12  MRSA PCR SCREENING     Status: None   Collection Time    03/18/12  8:55 PM      Result Value Range Status   MRSA by PCR NEGATIVE  NEGATIVE Final   Comment:            The GeneXpert MRSA Assay (FDA     approved for NASAL specimens     only), is one component of a     comprehensive MRSA colonization     surveillance  program. It is not     intended to diagnose MRSA     infection nor to guide or     monitor treatment for     MRSA infections.    Coagulation Studies: No results found for this basename: LABPROT, INR,  in the last 72 hours  Urinalysis:    Component Value Date/Time   COLORURINE YELLOW 03/18/2012 1259   APPEARANCEUR CLEAR 03/18/2012 1259   LABSPEC 1.015 03/18/2012 1259   PHURINE 5.0 03/18/2012 1259   GLUCOSEU NEGATIVE 03/18/2012 1259   HGBUR NEGATIVE 03/18/2012 1259   BILIRUBINUR NEGATIVE 03/18/2012 1259   KETONESUR NEGATIVE 03/18/2012 1259   PROTEINUR >300* 03/18/2012 1259   UROBILINOGEN 0.2 03/18/2012 1259   NITRITE NEGATIVE 03/18/2012 1259   LEUKOCYTESUR TRACE* 03/18/2012 1259     Imaging: Ct Biopsy  03/23/2012  *RADIOLOGY REPORT*  Clinical Data:  Renal failure, hypertension, diabetes, anemia, myeloma  CT GUIDED RIGHT ILIAC BONE MARROW ASPIRATION AND CORE BIOPSY  Date:  03/23/2012 09:00:00  Radiologist:  M. Ruel Favors, M.D.  Medications:  1.5 mg Versed, 75 mcg Fentanyl  Guidance:  CT  Sedation time:  15 minutes  Contrast volume:  None.  Complications:  No immediate  PROCEDURE/FINDINGS:  Informed consent was obtained from the patient following explanation of the procedure, risks, benefits and alternatives. The patient understands, agrees and consents for the procedure. All questions were addressed.  A time out was performed.  The patient was positioned prone and noncontrast localization CT was performed of the pelvis to demonstrate the iliac marrow spaces.  Maximal barrier sterile technique utilized including caps, mask, sterile gowns, sterile gloves, large sterile drape, hand hygiene, and betadine prep.  Under sterile conditions and local anesthesia, an 11 gauge coaxial bone biopsy needle was advanced into the right iliac marrow space. Needle position was confirmed with CT imaging. Initially, bone marrow aspiration was performed. Next, the 11 gauge outer cannula was utilized to obtain a right iliac bone  marrow core biopsy. Needle was removed. Hemostasis was obtained with compression. The patient tolerated  the procedure well. Samples were prepared with the cytotechnologist. No immediate complications.  IMPRESSION: CT guided right iliac bone marrow aspiration and core biopsy.   Original Report Authenticated By: Judie Petit. Miles Costain, M.D.       Medications:    Infusions:    Scheduled Medications: . aspirin EC  81 mg Oral Daily  . calcium acetate  667 mg Oral TID WC  .  ceFAZolin (ANCEF) IV  2 g Intravenous On Call  . darbepoetin (ARANESP) injection - DIALYSIS  60 mcg Intravenous Q Thu-HD  . doxazosin  4 mg Oral QHS  . doxercalciferol  2 mcg Intravenous Q T,Th,Sa-HD  . heparin  5,000 Units Subcutaneous Q8H  . hydrALAZINE  50 mg Oral TID  . insulin aspart  0-9 Units Subcutaneous TID WC  . isosorbide mononitrate  15 mg Oral Daily  . levothyroxine  175 mcg Oral QAC breakfast  . multivitamin  1 tablet Oral QHS  . nebivolol  2.5 mg Oral Daily    PRN Medications: sodium chloride, acetaminophen, cloNIDine, sodium chloride, zolpidem   Assessment/ Plan:    Patient is a 67 y.o. male with a PMHx of DM, HTN, CKD, MGUS and self reported thyroid removal, who was admitted to [facility] on 03/18/2012 for evaluation of shortness of breath. Renal is consulted for A/C RF.  History of MGUS followed by oncology Dr. Zella Ball with last OV 02/28/11--" he has an evolving myeloma vs active disease causing renal dysfunction. I have proposed that we recheck labs in 2 months and begin aranesp".  History of CKD followed by Dr. Georgia Dom. Baseline BUN 22 and Cr 3.29 with GRF 29 in Dec 2013.  Renal artery doppler 02/08/11-- unremarkable study  - Renal U/S---Echogenic renal parenchyma, suggesting medical renal disease   1. Acute on chronic renal failure in the setting of CKD IV , baseline Cr 3.29  HD TTS Concerning relation with MGUS vs Multiple Myeloma.  03/23/12  RT ILIAC BM ASP AND CORE BX---result pending  - Hepatitis panel  Negative,  - SPEP- monoclonal protein is present, Kappa and Lamda free light chains noted - Immunofixation--++IgG, Monoclonal IgG kappa protein is present - Hematology consult for possible Myeloma  - Phoslo, Aranesp, Hectorol  - Perm cath insertion and HD 03/19/12 and 3/7.  - clip for [facility] --outpt spot at [facility] 2nd shift  - Vein mapping and plan an LUA AVF on 03/25/12  2. Fluid overload, better  Likely associated with A/C RF. Reports baseline weight of 202 lbs.  - 96% on RA @ rest. Pt. was 94% on RA with ambulation   3. Hyponatremia 4. Anemia, baseline at 11  - see above  - Feraheme IV 03/19/12  - and aranesp   5. MGUS vs Multiple Myeloma  - Hematology consult  - BM biopsy by IR today   6. HTN  - clonidine PRN added  - defer it to primary care team   7. Hematuria in foley   - defer to primary care team for monitoring   DVT PPX - Heparin     Length of Stay: 6 days  Patient history and plan of care reviewed with attending, Dr. Allena Katz.   Jack Query, MD  PGYII, Internal Medicine Resident 03/24/2012, 7:28 AM  I have personally seen and examined this patient and agree with the assessment/plan as outlined above by Jack Query MD (PGY2). Patient with new ESRD and set up for HD on a TTS schedule at [facility]. Plans for DC today after HD to come  back to hospital tomorrow for access surgery (I have informed the [facility] team and they will plan accordingly) PATEL,JAY K.,MD 03/24/2012 9:09 AM

## 2012-03-24 NOTE — Progress Notes (Signed)
[facility] PROGRESS NOTE Interim History: 67 year old male patient with chronic kidney disease last documented creatinine 3.2 about one year prior. Diagnosed with MGUS January 2013. Workup included laboratory evaluation and bone marrow biopsy by hematology. At that time no evidence of multiple myeloma was found. He presented to the emergency department on the date of admission because of increasing shortness of breath progressive over 2 weeks. Laboratory data revealed markedly increased creatinine of 13. His chest x-ray revealed pulmonary vascular congestion without frank interstitial edema and no effusion or pneumothorax. He was also bradycardic likely do to poorly cleared beta blockers in the setting of renal failure. He was initially admitted to the renal floor but developed respiratory distress overnight and required transfer to the step down unit for utilization of BiPAP. Subsequently he has been weaned off of BiPAP to nasal cannula oxygen. He is currently being evaluated by nephrology and hematology.   Assessment/Plan: Acute on CKD (chronic kidney disease) stage 5, GFR less than 15 ml/min (progressive) / Metabolic acidosis  - HD Diatek cath placed3.5.2014. - HD initiated 3/6, will cont. TTS Ct guided Bx 3.10.2014 by IR pending. - Heme/Nephro suspect etiology is MGUS and poss MM, maybe diabetic nephropathy. - SPEP show elevated free kappa and Lamda light chain,  and immunofixation from 03/18/2012. - Vein mapping done on 03/20/2012. Fistula on 03/25/2012 as an outpatient.  Acute respiratory failure with hypoxia due to Volume overload  -improved after initiation of HD   Hyponatremia  -due to volume overload/resolved with HD.  Anemia in chronic kidney disease/Iron deficiency  - Baseline hgb 2013 was 11-12; most likely 22 worsening renal failure. - Will need IV iron. - Aranesp per nephro   Bradycardia  -Resolved, avoid clonidine or other rate controlling meds. bedside nebivolol. -  Resume low dose BB.  Essential hypertension, benign  - BP markedly elevated. - Increase Hydralazine, imduer and Cardura. - Further titration as an outpatient.  Diabetes mellitus type 1, controlled, with complications  -CBG controlled here - 8.8HgbA1c  -was on Onglyza at home   Hypothyroidism  -cont synthroid -TSH 1.443   Code Status: Full  Family Communication: Patient  Disposition Plan: remains inpatient   Consultants:  Nephrology  Hematology   Procedures:  Insertion cuffed hemodialysis catheter right IJ (23 cm) (03/19/12)   Antibiotics:  Ancef IV preop x1 dose (03/19/12)   HPI/Subjective: No complains  Objective: Filed Vitals:   03/24/12 0650 03/24/12 0700 03/24/12 0730 03/24/12 0800  BP: 185/84 185/84 175/102 188/101  Pulse: 64 58 60 70  Temp:      TempSrc:      Resp:      Height:      Weight:      SpO2: 96% 96%      Intake/Output Summary (Last 24 hours) at 03/24/12 0838 Last data filed at 03/24/12 0530  Gross per 24 hour  Intake    840 ml  Output    675 ml  Net    165 ml   Filed Weights   03/22/12 2017 03/23/12 2100 03/24/12 0645  Weight: 94.8 kg (208 lb 15.9 oz) 94.799 kg (208 lb 15.9 oz) 91.5 kg (201 lb 11.5 oz)    Exam:  General: Alert, awake, oriented x3, in no acute distress.  HEENT: No bruits, no goiter.  Heart: Regular rate and rhythm, without murmurs, rubs, gallops.  Lungs: Good air movement, bclear to auscultation. Cath insertion clean Abdomen: Soft, nontender, nondistended, positive bowel sounds.     Data Reviewed: Basic Metabolic Panel:  Recent Labs Lab 03/18/12 0940  03/21/12 0452 03/22/12 0525 03/23/12 0535 03/24/12 0425 03/24/12 0657  NA 131*  < > 137 139 137 138 139  K 5.1  < > 3.9 4.1 3.9 4.0 4.0  CL 95*  < > 99 101 98 101 102  CO2 18*  < > 26 26 23 25 25   GLUCOSE 194*  < > 156* 148* 137* 150* 161*  BUN 91*  < > 49* 50* 50* 51* 50*  CREATININE 13.39*  < > 9.07* 10.48* 11.09* 11.38* 11.42*  CALCIUM 9.0  < > 8.7 9.1  9.2 9.1 9.2  MG 2.5  --   --   --   --   --   --   PHOS 7.5*  < > 4.6 5.1* 4.3 4.3 4.4  < > = values in this interval not displayed. Liver Function Tests:  Recent Labs Lab 03/18/12 0940  03/21/12 0452 03/22/12 0525 03/23/12 0535 03/24/12 0425 03/24/12 0657  AST 8  --   --   --   --   --   --   ALT 8  --   --   --   --   --   --   ALKPHOS 63  --   --   --   --   --   --   BILITOT 0.4  --   --   --   --   --   --   PROT 7.8  --   --   --   --   --   --   ALBUMIN 3.1*  < > 2.6* 2.6* 2.7* 2.8* 2.9*  < > = values in this interval not displayed. No results found for this basename: LIPASE, AMYLASE,  in the last 168 hours No results found for this basename: AMMONIA,  in the last 168 hours CBC:  Recent Labs Lab 03/18/12 0940 03/18/12 1556 03/19/12 0615 03/20/12 0515 03/23/12 1329 03/24/12 0657  WBC 6.8 5.8 6.1 5.9 5.4 5.2  NEUTROABS 5.4  --   --   --   --   --   HGB 9.8* 9.6* 8.8* 8.9* 9.5* 9.2*  HCT 28.8* 27.7* 25.6* 26.7* 29.2* 28.6*  MCV 83.5 84.2 84.5 86.7 88.5 88.8  PLT 170 171 162 175 327 307   Cardiac Enzymes: No results found for this basename: CKTOTAL, CKMB, CKMBINDEX, TROPONINI,  in the last 168 hours BNP (last 3 results)  Recent Labs  03/18/12 0940  PROBNP 12648.0*   CBG:  Recent Labs Lab 03/22/12 2009 03/23/12 0733 03/23/12 1144 03/23/12 1659 03/23/12 2103  GLUCAP 121* 148* 165* 156* 198*    Recent Results (from the past 240 hour(s))  MRSA PCR SCREENING     Status: None   Collection Time    03/18/12  8:55 PM      Result Value Range Status   MRSA by PCR NEGATIVE  NEGATIVE Final   Comment:            The GeneXpert MRSA Assay (FDA     approved for NASAL specimens     only), is one component of a     comprehensive MRSA colonization     surveillance program. It is not     intended to diagnose MRSA     infection nor to guide or     monitor treatment for     MRSA infections.     Studies: Ct Biopsy  03/23/2012  *RADIOLOGY REPORT*  Clinical  Data:  Renal failure, hypertension, diabetes, anemia, myeloma  CT GUIDED RIGHT ILIAC BONE MARROW ASPIRATION AND CORE BIOPSY  Date:  03/23/2012 09:00:00  Radiologist:  M. Ruel Favors, M.D.  Medications:  1.5 mg Versed, 75 mcg Fentanyl  Guidance:  CT  Sedation time:  15 minutes  Contrast volume:  None.  Complications:  No immediate  PROCEDURE/FINDINGS:  Informed consent was obtained from the patient following explanation of the procedure, risks, benefits and alternatives. The patient understands, agrees and consents for the procedure. All questions were addressed.  A time out was performed.  The patient was positioned prone and noncontrast localization CT was performed of the pelvis to demonstrate the iliac marrow spaces.  Maximal barrier sterile technique utilized including caps, mask, sterile gowns, sterile gloves, large sterile drape, hand hygiene, and betadine prep.  Under sterile conditions and local anesthesia, an 11 gauge coaxial bone biopsy needle was advanced into the right iliac marrow space. Needle position was confirmed with CT imaging. Initially, bone marrow aspiration was performed. Next, the 11 gauge outer cannula was utilized to obtain a right iliac bone marrow core biopsy. Needle was removed. Hemostasis was obtained with compression. The patient tolerated the procedure well. Samples were prepared with the cytotechnologist. No immediate complications.  IMPRESSION: CT guided right iliac bone marrow aspiration and core biopsy.   Original Report Authenticated By: Judie Petit. Shick, M.D.     Scheduled Meds: . aspirin EC  81 mg Oral Daily  . calcium acetate  667 mg Oral TID WC  .  ceFAZolin (ANCEF) IV  2 g Intravenous On Call  . darbepoetin (ARANESP) injection - DIALYSIS  60 mcg Intravenous Q Thu-HD  . doxazosin  4 mg Oral QHS  . doxercalciferol  2 mcg Intravenous Q T,Th,Sa-HD  . heparin  5,000 Units Subcutaneous Q8H  . hydrALAZINE  50 mg Oral TID  . insulin aspart  0-9 Units Subcutaneous TID WC  .  isosorbide mononitrate  15 mg Oral Daily  . levothyroxine  175 mcg Oral QAC breakfast  . multivitamin  1 tablet Oral QHS  . nebivolol  2.5 mg Oral Daily   Continuous Infusions:    Marinda Elk  [facility] Pager [phone]. If 8PM-8AM, please contact night-coverage at [email or web address], password [facility] 03/24/2012, 8:38 AM  LOS: 6 days

## 2012-03-24 NOTE — Telephone Encounter (Signed)
S/W pt dtr in re hosp f/u appt 03/17 @ 12 w/Dr. Gaylyn Rong.  Welcome packet mailed.

## 2012-03-25 ENCOUNTER — Encounter (HOSPITAL_COMMUNITY): Payer: Self-pay | Admitting: *Deleted

## 2012-03-25 ENCOUNTER — Telehealth: Payer: Self-pay | Admitting: Vascular Surgery

## 2012-03-25 ENCOUNTER — Encounter (HOSPITAL_COMMUNITY): Payer: Self-pay | Admitting: Anesthesiology

## 2012-03-25 ENCOUNTER — Ambulatory Visit (HOSPITAL_COMMUNITY): Payer: Medicare Other | Admitting: Anesthesiology

## 2012-03-25 ENCOUNTER — Encounter (HOSPITAL_COMMUNITY)
Admission: RE | Disposition: A | Discharge status: Home / Self Care | Payer: Self-pay | Source: Ambulatory Visit | Attending: Vascular Surgery

## 2012-03-25 ENCOUNTER — Ambulatory Visit (HOSPITAL_COMMUNITY)
Admission: RE | Admit: 2012-03-25 | Discharge: 2012-03-25 | Disposition: A | Discharge status: Home / Self Care | Payer: Medicare Other | Source: Ambulatory Visit | Attending: Vascular Surgery | Admitting: Vascular Surgery

## 2012-03-25 ENCOUNTER — Other Ambulatory Visit: Payer: Self-pay | Admitting: *Deleted

## 2012-03-25 DIAGNOSIS — E039 Hypothyroidism, unspecified: Secondary | ICD-10-CM | POA: Insufficient documentation

## 2012-03-25 DIAGNOSIS — E119 Type 2 diabetes mellitus without complications: Secondary | ICD-10-CM | POA: Insufficient documentation

## 2012-03-25 DIAGNOSIS — N186 End stage renal disease: Secondary | ICD-10-CM | POA: Insufficient documentation

## 2012-03-25 DIAGNOSIS — Z87891 Personal history of nicotine dependence: Secondary | ICD-10-CM | POA: Insufficient documentation

## 2012-03-25 DIAGNOSIS — R0602 Shortness of breath: Secondary | ICD-10-CM | POA: Insufficient documentation

## 2012-03-25 DIAGNOSIS — Z992 Dependence on renal dialysis: Secondary | ICD-10-CM | POA: Insufficient documentation

## 2012-03-25 DIAGNOSIS — I120 Hypertensive chronic kidney disease with stage 5 chronic kidney disease or end stage renal disease: Secondary | ICD-10-CM | POA: Insufficient documentation

## 2012-03-25 HISTORY — PX: AV FISTULA PLACEMENT: SHX1204

## 2012-03-25 LAB — POCT I-STAT 4, (NA,K, GLUC, HGB,HCT)
HCT: 32 % — ABNORMAL LOW (ref 39.0–52.0)
Hemoglobin: 10.9 g/dL — ABNORMAL LOW (ref 13.0–17.0)
Potassium: 3.7 mEq/L (ref 3.5–5.1)
Sodium: 139 mEq/L (ref 135–145)

## 2012-03-25 SURGERY — ARTERIOVENOUS (AV) FISTULA CREATION
Anesthesia: Monitor Anesthesia Care | Site: Arm Upper | Laterality: Left | Wound class: Clean

## 2012-03-25 MED ORDER — 0.9 % SODIUM CHLORIDE (POUR BTL) OPTIME
TOPICAL | Status: DC | PRN
  Administered 2012-03-25: 1000 mL

## 2012-03-25 MED ORDER — MIDAZOLAM HCL 5 MG/5ML IJ SOLN
INTRAMUSCULAR | Status: DC | PRN
  Administered 2012-03-25: 2 mg via INTRAVENOUS

## 2012-03-25 MED ORDER — DEXTROSE 5 % IV SOLN
INTRAVENOUS | Status: AC
  Filled 2012-03-25: qty 50

## 2012-03-25 MED ORDER — NEBIVOLOL HCL 10 MG PO TABS
10.0000 mg | ORAL_TABLET | Freq: Once | ORAL | Status: AC
  Administered 2012-03-25: 10 mg via ORAL
  Filled 2012-03-25: qty 1

## 2012-03-25 MED ORDER — PROPOFOL INFUSION 10 MG/ML OPTIME
INTRAVENOUS | Status: DC | PRN
  Administered 2012-03-25: 100 ug/kg/min via INTRAVENOUS

## 2012-03-25 MED ORDER — LIDOCAINE-EPINEPHRINE (PF) 1 %-1:200000 IJ SOLN
INTRAMUSCULAR | Status: AC
  Filled 2012-03-25: qty 10

## 2012-03-25 MED ORDER — FENTANYL CITRATE 0.05 MG/ML IJ SOLN
INTRAMUSCULAR | Status: DC | PRN
  Administered 2012-03-25: 50 ug via INTRAVENOUS

## 2012-03-25 MED ORDER — OXYCODONE HCL 5 MG PO TABS: 5.0000 mg | ORAL_TABLET | Freq: Four times a day (QID) | ORAL | Status: DC | PRN

## 2012-03-25 MED ORDER — CEFUROXIME SODIUM 1.5 G IJ SOLR
INTRAMUSCULAR | Status: AC
  Filled 2012-03-25: qty 1.5

## 2012-03-25 MED ORDER — DEXTROSE 5 % IV SOLN
1.5000 g | INTRAVENOUS | Status: AC
  Administered 2012-03-25: 1.5 g via INTRAVENOUS
  Filled 2012-03-25: qty 1.5

## 2012-03-25 MED ORDER — LIDOCAINE-EPINEPHRINE (PF) 1 %-1:200000 IJ SOLN
INTRAMUSCULAR | Status: DC | PRN
  Administered 2012-03-25: 17 mL

## 2012-03-25 MED ORDER — ONDANSETRON HCL 4 MG/2ML IJ SOLN: 4.0000 mg | Freq: Four times a day (QID) | INTRAMUSCULAR | Status: DC | PRN

## 2012-03-25 MED ORDER — SODIUM CHLORIDE 0.9 % IR SOLN
Status: DC | PRN
  Administered 2012-03-25: 09:00:00

## 2012-03-25 MED ORDER — ONDANSETRON HCL 4 MG/2ML IJ SOLN
INTRAMUSCULAR | Status: DC | PRN
  Administered 2012-03-25: 4 mg via INTRAVENOUS

## 2012-03-25 MED ORDER — SODIUM CHLORIDE 0.9 % IV SOLN
INTRAVENOUS | Status: DC
  Administered 2012-03-25: 08:00:00 via INTRAVENOUS

## 2012-03-25 MED ORDER — FENTANYL CITRATE 0.05 MG/ML IJ SOLN: 25.0000 ug | INTRAMUSCULAR | Status: DC | PRN

## 2012-03-25 SURGICAL SUPPLY — 37 items
CANISTER SUCTION 2500CC (MISCELLANEOUS) ×2 IMPLANT
CLIP TI MEDIUM 6 (CLIP) ×2 IMPLANT
CLIP TI WIDE RED SMALL 6 (CLIP) ×4 IMPLANT
CLOTH BEACON ORANGE TIMEOUT ST (SAFETY) ×2 IMPLANT
COVER PROBE W GEL 5X96 (DRAPES) ×2 IMPLANT
COVER SURGICAL LIGHT HANDLE (MISCELLANEOUS) ×2 IMPLANT
DERMABOND ADVANCED (GAUZE/BANDAGES/DRESSINGS) ×1
DERMABOND ADVANCED .7 DNX12 (GAUZE/BANDAGES/DRESSINGS) ×1 IMPLANT
DRAIN PENROSE 1/4X12 LTX STRL (WOUND CARE) ×2 IMPLANT
ELECT REM PT RETURN 9FT ADLT (ELECTROSURGICAL) ×2
ELECTRODE REM PT RTRN 9FT ADLT (ELECTROSURGICAL) ×1 IMPLANT
GEL ULTRASOUND 20GR AQUASONIC (MISCELLANEOUS) ×2 IMPLANT
GLOVE BIOGEL M 6.5 STRL (GLOVE) ×4 IMPLANT
GLOVE BIOGEL PI IND STRL 6.5 (GLOVE) ×1 IMPLANT
GLOVE BIOGEL PI IND STRL 7.0 (GLOVE) ×1 IMPLANT
GLOVE BIOGEL PI IND STRL 7.5 (GLOVE) ×1 IMPLANT
GLOVE BIOGEL PI INDICATOR 6.5 (GLOVE) ×1
GLOVE BIOGEL PI INDICATOR 7.0 (GLOVE) ×1
GLOVE BIOGEL PI INDICATOR 7.5 (GLOVE) ×1
GLOVE SS BIOGEL STRL SZ 7 (GLOVE) ×1 IMPLANT
GLOVE SUPERSENSE BIOGEL SZ 7 (GLOVE) ×1
GLOVE SURG SS PI 7.5 STRL IVOR (GLOVE) ×2 IMPLANT
GOWN PREVENTION PLUS XLARGE (GOWN DISPOSABLE) ×2 IMPLANT
GOWN STRL NON-REIN LRG LVL3 (GOWN DISPOSABLE) ×4 IMPLANT
KIT BASIN OR (CUSTOM PROCEDURE TRAY) ×2 IMPLANT
KIT ROOM TURNOVER OR (KITS) ×2 IMPLANT
NS IRRIG 1000ML POUR BTL (IV SOLUTION) ×2 IMPLANT
PACK CV ACCESS (CUSTOM PROCEDURE TRAY) ×2 IMPLANT
PAD ARMBOARD 7.5X6 YLW CONV (MISCELLANEOUS) ×4 IMPLANT
SPONGE GAUZE 4X4 12PLY (GAUZE/BANDAGES/DRESSINGS) IMPLANT
SUT PROLENE 6 0 BV (SUTURE) ×2 IMPLANT
SUT VIC AB 3-0 SH 27 (SUTURE) ×1
SUT VIC AB 3-0 SH 27X BRD (SUTURE) ×1 IMPLANT
TOWEL OR 17X24 6PK STRL BLUE (TOWEL DISPOSABLE) ×2 IMPLANT
TOWEL OR 17X26 10 PK STRL BLUE (TOWEL DISPOSABLE) ×2 IMPLANT
UNDERPAD 30X30 INCONTINENT (UNDERPADS AND DIAPERS) ×2 IMPLANT
WATER STERILE IRR 1000ML POUR (IV SOLUTION) ×2 IMPLANT

## 2012-03-25 NOTE — Progress Notes (Signed)
Report given to stephanie rn as caegiver

## 2012-03-25 NOTE — Progress Notes (Signed)
Called Frann Rider (case manager) concerning patient's need for transportation to and from HD. Stated she would call a Child psychotherapist to see patient here at short stay before D/C home

## 2012-03-25 NOTE — H&P (View-Only) (Signed)
Patient ID: Jack [name], male   DOB: [birth year], 67 y.o.   MRN: [id number] Vascular Surgery Progress Note  Subjective: acute and chronic renal failure-started on hemodialysis  Objective:  Filed Vitals:   03/23/12 0614  BP: 154/73  Pulse: 68  Temp: 98.6 F (37 C)  Resp: 16       Labs:  Recent Labs Lab 03/21/12 0452 03/22/12 0525 03/23/12 0535  CREATININE 9.07* 10.48* 11.09*    Recent Labs Lab 03/21/12 0452 03/22/12 0525 03/23/12 0535  NA 137 139 137  K 3.9 4.1 3.9  CL 99 101 98  CO2 26 26 23   BUN 49* 50* 50*  CREATININE 9.07* 10.48* 11.09*  GLUCOSE 156* 148* 137*  CALCIUM 8.7 9.1 9.2    Recent Labs Lab 03/18/12 1556 03/19/12 0615 03/20/12 0515  WBC 5.8 6.1 5.9  HGB 9.6* 8.8* 8.9*  HCT 27.7* 25.6* 26.7*  PLT 171 162 175    Recent Labs Lab 03/19/12 0712  INR 1.31    I/O last 3 completed shifts: In: 480 [P.O.:480] Out: -   Imaging: No results found.  Assessment/Plan:  POD #5   LOS: 5 days  s/p Procedure(s): INSERTION OF DIALYSIS CATHETER  Plan left brachial cephalic or radial cephalic AV fistula on Wednesday-early a.m.   Josephina Gip, MD 03/23/2012 9:02 AM

## 2012-03-25 NOTE — Telephone Encounter (Signed)
Per Lamar Blinks, RN, patient needs follow up in 4-6wks for follow up of AVF.   Scheduled and mailed letter, dpm

## 2012-03-25 NOTE — Op Note (Signed)
OPERATIVE REPORT  Date of Surgery: 03/25/2012  Surgeon: Josephina Gip, MD  Assistant: Lorrine Kin Pre-op Diagnosis: ESRD  Post-op Diagnosis: ESRD  Procedure: Procedure(s): ARTERIOVENOUS (AV) FISTULA CREATION-left brachial-cephalic Anesthesia: MAC  EBL: Minimal  Complications: None  Procedure Details: Patient was taken to the operating room placed in the supine position at which time the left upper extremity was prepped with Betadine scrub and solution draped in routine sterile manner. The cephalic vein was then imaged using the SonoSite ultrasound. The vein was borderline in the forearm but did seem adequate in the upper arm. Therefore after infiltration with 1% Xylocaine transverse incision was made in the antecubital area the antecubital vein dissected free. The cephalic branch was adequate about 3-3-1/2 mm in size. It was ligated distally and transected and gently dilated with heparinized saline. Brachial artery was exposed beneath the fascia encircled with vessel loops. It was occluded proximally and distally opened with a 15 blade extended with Potts scissors. The cephalic vein was then spatulated and anastomosed end to side with 6-0 Prolene. Vessel loops were released there was good pulse and palpable thrill in the fistula with excellent Doppler flow in the proximal upper arm and good radial and ulnar flow which slightly diminished with opening of the fistula. Adequate hemostasis was achieved the wound closed in layers with Vicryl in a subcuticular fashion plus Dermabond patient taken to recovery in stable condition   Josephina Gip, MD 03/25/2012 9:35 AM

## 2012-03-25 NOTE — Transfer of Care (Signed)
Immediate Anesthesia Transfer of Care Note  Patient: Jack [name]  Procedure(s) Performed: Procedure(s): ARTERIOVENOUS (AV) FISTULA CREATION (Left)  Patient Location: PACU  Anesthesia Type:MAC  Level of Consciousness: awake, alert , oriented and patient cooperative  Airway & Oxygen Therapy: Patient Spontanous Breathing and Patient connected to nasal cannula oxygen  Post-op Assessment: Report given to PACU RN, Post -op Vital signs reviewed and stable and Patient moving all extremities  Post vital signs: Reviewed and stable  Complications: No apparent anesthesia complications

## 2012-03-25 NOTE — Progress Notes (Signed)
Patient concerned regarding need for transportation to/from dialysis once discharged today.  Talked with case management, Roderic Scarce [phone]. Stated call her when patient in post op and she will contact social worker to arrange information.

## 2012-03-25 NOTE — Interval H&P Note (Signed)
History and Physical Interval Note:  03/25/2012 8:16 AM  Jack [name]  has presented today for surgery, with the diagnosis of ESRD  The various methods of treatment have been discussed with the patient and family. After consideration of risks, benefits and other options for treatment, the patient has consented to  Procedure(s): ARTERIOVENOUS (AV) FISTULA CREATION (Left) as a surgical intervention .  The patient's history has been reviewed, patient examined, no change in status, stable for surgery.  I have reviewed the patient's chart and labs.  Questions were answered to the patient's satisfaction.     Josephina Gip

## 2012-03-25 NOTE — Anesthesia Preprocedure Evaluation (Signed)
Anesthesia Evaluation  Patient identified by MRN, date of birth, ID band Patient awake    Reviewed: Allergy & Precautions, H&P , NPO status , Patient's Chart, lab work & pertinent test results  Airway Mallampati: II  Neck ROM: full    Dental   Pulmonary shortness of breath, former smoker,          Cardiovascular hypertension,     Neuro/Psych    GI/Hepatic   Endo/Other  diabetes, Type 2Hypothyroidism   Renal/GU ESRF and DialysisRenal disease     Musculoskeletal   Abdominal   Peds  Hematology   Anesthesia Other Findings   Reproductive/Obstetrics                           Anesthesia Physical Anesthesia Plan  ASA: III  Anesthesia Plan: MAC   Post-op Pain Management:    Induction: Intravenous  Airway Management Planned: Simple Face Mask  Additional Equipment:   Intra-op Plan:   Post-operative Plan:   Informed Consent: I have reviewed the patients History and Physical, chart, labs and discussed the procedure including the risks, benefits and alternatives for the proposed anesthesia with the patient or authorized representative who has indicated his/her understanding and acceptance.     Plan Discussed with: CRNA and Surgeon  Anesthesia Plan Comments:         Anesthesia Quick Evaluation

## 2012-03-25 NOTE — Anesthesia Procedure Notes (Signed)
Procedure Name: MAC Date/Time: 03/25/2012 8:24 AM Performed by: Jerilee Hoh Pre-anesthesia Checklist: Patient identified, Emergency Drugs available, Suction available and Patient being monitored Patient Re-evaluated:Patient Re-evaluated prior to inductionOxygen Delivery Method: Simple face mask Intubation Type: IV induction Placement Confirmation: positive ETCO2 and breath sounds checked- equal and bilateral

## 2012-03-25 NOTE — Anesthesia Postprocedure Evaluation (Signed)
Anesthesia Post Note  Patient: Jack [name]  Procedure(s) Performed: Procedure(s) (LRB): ARTERIOVENOUS (AV) FISTULA CREATION (Left)  Anesthesia type: MAC  Patient location: PACU  Post pain: Pain level controlled and Adequate analgesia  Post assessment: Post-op Vital signs reviewed, Patient's Cardiovascular Status Stable and Respiratory Function Stable  Last Vitals:  Filed Vitals:   03/25/12 1015  BP: 143/69  Pulse: 51  Temp:   Resp: 15    Post vital signs: Reviewed and stable  Level of consciousness: awake, alert  and oriented  Complications: No apparent anesthesia complications

## 2012-03-25 NOTE — Preoperative (Signed)
Beta Blockers   Reason not to administer Beta Blockers:Not Applicable 

## 2012-03-28 ENCOUNTER — Encounter (HOSPITAL_COMMUNITY): Payer: Self-pay | Admitting: Vascular Surgery

## 2012-03-30 ENCOUNTER — Encounter: Payer: Self-pay | Admitting: Oncology

## 2012-03-30 ENCOUNTER — Ambulatory Visit (HOSPITAL_BASED_OUTPATIENT_CLINIC_OR_DEPARTMENT_OTHER): Payer: Medicare Other | Admitting: Oncology

## 2012-03-30 ENCOUNTER — Telehealth: Payer: Self-pay | Admitting: Oncology

## 2012-03-30 ENCOUNTER — Other Ambulatory Visit: Payer: Self-pay | Admitting: *Deleted

## 2012-03-30 ENCOUNTER — Other Ambulatory Visit (HOSPITAL_BASED_OUTPATIENT_CLINIC_OR_DEPARTMENT_OTHER): Payer: Medicare Other | Admitting: Lab

## 2012-03-30 ENCOUNTER — Ambulatory Visit: Payer: Medicare Other

## 2012-03-30 VITALS — BP 113/58 | HR 46 | Temp 97.0°F | Resp 20 | Ht 70.0 in | Wt 196.0 lb

## 2012-03-30 DIAGNOSIS — C9000 Multiple myeloma not having achieved remission: Secondary | ICD-10-CM

## 2012-03-30 DIAGNOSIS — E108 Type 1 diabetes mellitus with unspecified complications: Secondary | ICD-10-CM

## 2012-03-30 DIAGNOSIS — N185 Chronic kidney disease, stage 5: Secondary | ICD-10-CM

## 2012-03-30 DIAGNOSIS — Z992 Dependence on renal dialysis: Secondary | ICD-10-CM

## 2012-03-30 DIAGNOSIS — D631 Anemia in chronic kidney disease: Secondary | ICD-10-CM

## 2012-03-30 DIAGNOSIS — D472 Monoclonal gammopathy: Secondary | ICD-10-CM

## 2012-03-30 DIAGNOSIS — R001 Bradycardia, unspecified: Secondary | ICD-10-CM

## 2012-03-30 DIAGNOSIS — N186 End stage renal disease: Secondary | ICD-10-CM

## 2012-03-30 HISTORY — DX: Multiple myeloma not having achieved remission: C90.00

## 2012-03-30 LAB — CBC WITH DIFFERENTIAL/PLATELET
BASO%: 1.1 % (ref 0.0–2.0)
EOS%: 1.2 % (ref 0.0–7.0)
LYMPH%: 16.3 % (ref 14.0–49.0)
MCHC: 32.2 g/dL (ref 32.0–36.0)
MONO#: 0.7 10*3/uL (ref 0.1–0.9)
Platelets: 215 10*3/uL (ref 140–400)
RBC: 3.46 10*6/uL — ABNORMAL LOW (ref 4.20–5.82)
WBC: 6.4 10*3/uL (ref 4.0–10.3)

## 2012-03-30 LAB — BASIC METABOLIC PANEL (CC13)
CO2: 28 mEq/L (ref 22–29)
Calcium: 9.9 mg/dL (ref 8.4–10.4)
Sodium: 136 mEq/L (ref 136–145)

## 2012-03-30 MED ORDER — WARFARIN SODIUM 5 MG PO TABS: 5.0000 mg | ORAL_TABLET | Freq: Every day | ORAL | Status: DC

## 2012-03-30 MED ORDER — DEXAMETHASONE 4 MG PO TABS: 20.0000 mg | ORAL_TABLET | ORAL | Status: DC

## 2012-03-30 MED ORDER — LENALIDOMIDE 5 MG PO CAPS: 5.0000 mg | ORAL_CAPSULE | Freq: Every day | ORAL | Status: DC

## 2012-03-30 MED ORDER — PROCHLORPERAZINE MALEATE 10 MG PO TABS: 10.0000 mg | ORAL_TABLET | Freq: Four times a day (QID) | ORAL | Status: DC | PRN

## 2012-03-30 MED ORDER — ACYCLOVIR 200 MG PO CAPS: 200.0000 mg | ORAL_CAPSULE | Freq: Two times a day (BID) | ORAL | Status: DC

## 2012-03-30 NOTE — Patient Instructions (Addendum)
1.  Diagnosis:  Multiple myeloma. 2.  Causes:  Unknown.  3.  What is it:  Most of the time, a slow growing cancer of the plasma cell in the bone marrow.  Untreated, it can cause:  Hypercalcemia, renal failure, anemia, and bone fracture.  Having one of these 4 criteria indicates need for treatment.   4.  Stage:  Not classicaly staged like solid cancer; but rather according to bone marrow cytogenetics and whether there are indication to treat.  5.  Treatment:      *  Oral + SQ chemo:  Revlimid 5mg  pill (days 1-21); Velcade subcutaneous once a week (3 weeks on, 1 week off);  Dexamethasone 40mg  pill (once a week); every 4 week-cycle.  *  In patients who are reasonably in good health, we may consider autologous bone marrow transplant to improve chance of cure.   6.  Potential side effects of these chemo:  Low blood count, bleeding, infection, neuropathy, skin rash, blood clot.   7.  Chance of response:  Upward to 70-80% depending on risk stratification.    8.  How to follow response:  M-spike (on SPEP) and serum light chain should decrease with chemo.   The end stage kidney disease may not improved; meaning that you may be on dialysis long term.  However, without treatment, it can cause other issues such as low blood count, bone fracture.

## 2012-03-30 NOTE — Progress Notes (Signed)
[facility]  Telephone:(336) [phone] Fax:(336) [phone]   OFFICE PROGRESS NOTE   Cc:  Geraldo Pitter, MD  DIAGNOSIS:  MGUS progression to IgG kappa multiple myeloma; presenting with anemia, ESRD.  Bone marrow biopsy on 03/23/2012 showed 12% plasma cell.  FISH and cytogenetics are pending.   CURRENT THERAPY:  Due to start chemo soon.   INTERVAL HISTORY: Jack [name] 67 y.o. male returns for regular follow up with his daughter to go over result of bone marrow biopsy  last week. Since starting on hemodialysis, his breathing has significantly improved.  He denied SOB, PND, orthopnea.  His bilateral pedal edema has significantly improved as well.  He was discharged home from the hospital without PT/OT or any equipment.  He has a very difficult time ambulating around by himself.  He has severe generalized weakness. He can only hold on to the wall to ambulate from his bedroom to the bathroom next door.  He is able to sit on a shower chair for shower and able to get dressed himself.  Beside these, he is not able to do much else due to diffuse weakness.   Patient denies fever, headache, visual changes, confusion, drenching night sweats, palpable lymph node swelling, mucositis, odynophagia, dysphagia, nausea vomiting, jaundice, chest pain, palpitation, melena, hematochezia, hematuria, skin rash, spontaneous bleeding, joint swelling, joint pain, heat or cold intolerance, bowel bladder incontinence, back pain, paresthesia, depression.    Past Medical History  Diagnosis Date  . Renal disorder   . Hypertension   . Diabetes mellitus without complication   . Shortness of breath   . Pneumonia     2012  . Heart murmur   . Glaucoma   . Multiple myeloma, without mention of having achieved remission(203.00) 03/30/2012    Past Surgical History  Procedure Laterality Date  . Thyroidectomy    . Cervical disc surgery    . Eye surgery      CATARACTS  . Insertion of dialysis catheter  Right 03/19/2012    Procedure: INSERTION OF DIALYSIS CATHETER;  Surgeon: Pryor Ochoa, MD;  Location: [facility] OR;  Service: Vascular;  Laterality: Right;  Right Internal Jugular  . Av fistula placement Left 03/25/2012    Procedure: ARTERIOVENOUS (AV) FISTULA CREATION;  Surgeon: Pryor Ochoa, MD;  Location: [facility] OR;  Service: Vascular;  Laterality: Left;    Current Outpatient Prescriptions  Medication Sig Dispense Refill  . aspirin EC 81 MG tablet Take 81 mg by mouth daily.      . calcitRIOL (ROCALTROL) 0.25 MCG capsule Take 0.25 mcg by mouth daily.      . calcium acetate (PHOSLO) 667 MG capsule Take 1 capsule (667 mg total) by mouth 3 (three) times daily with meals.  9 capsule  0  . darbepoetin (ARANESP) 60 MCG/0.3ML SOLN Inject 0.3 mLs (60 mcg total) into the vein every Thursday with hemodialysis.  4.2 mL    . doxazosin (CARDURA) 4 MG tablet Take 4 mg by mouth at bedtime.      Marland Kitchen doxercalciferol (HECTOROL) 4 MCG/2ML injection Inject 1 mL (2 mcg total) into the vein Every Tuesday,Thursday,and Saturday with dialysis.  2 mL    . hydrALAZINE (APRESOLINE) 25 MG tablet Take 25 mg by mouth 3 (three) times daily.      . isosorbide mononitrate (IMDUR) 30 MG 24 hr tablet Take 1 tablet (30 mg total) by mouth daily.  30 tablet  0  . levothyroxine (SYNTHROID, LEVOTHROID) 175 MCG tablet Take 175 mcg by  mouth daily.      . multivitamin (RENA-VIT) TABS tablet Take 1 tablet by mouth at bedtime.  30 tablet  0  . oxyCODONE (ROXICODONE) 5 MG immediate release tablet Take 1 tablet (5 mg total) by mouth every 6 (six) hours as needed for pain.  30 tablet  0  . saxagliptin HCl (ONGLYZA) 5 MG TABS tablet Take 5 mg by mouth daily.      Marland Kitchen acyclovir (ZOVIRAX) 200 MG capsule Take 1 capsule (200 mg total) by mouth 2 (two) times daily.  60 capsule  3  . dexamethasone (DECADRON) 4 MG tablet Take 5 tablets (20 mg total) by mouth once a week.  100 tablet  3  . lenalidomide (REVLIMID) 5 MG capsule Take 1 capsule (5 mg total) by mouth  daily.  21 capsule  0  . nebivolol (BYSTOLIC) 10 MG tablet Take 10 mg by mouth daily.      . prochlorperazine (COMPAZINE) 10 MG tablet Take 1 tablet (10 mg total) by mouth every 6 (six) hours as needed.  30 tablet  3  . warfarin (COUMADIN) 5 MG tablet Take 1 tablet (5 mg total) by mouth daily.  30 tablet  3   No current facility-administered medications for this visit.    ALLERGIES:  is allergic to ivp dye and morphine and related.  REVIEW OF SYSTEMS:  The rest of the 14-point review of system was negative.   Filed Vitals:   03/30/12 1221  BP: 113/58  Pulse: 46  Temp: 97 F (36.1 C)  Resp: 20   Wt Readings from Last 3 Encounters:  03/30/12 196 lb (88.905 kg)  03/24/12 193 lb 5.5 oz (87.7 kg)  03/24/12 193 lb 5.5 oz (87.7 kg)   ECOG Performance status: 2-3  PHYSICAL EXAMINATION:   General:  well-nourished man, tired appearing, in no acute distress.  Eyes:  no scleral icterus.  ENT:  There were no oropharyngeal lesions.  Neck was without thyromegaly.  Lymphatics:  Negative cervical, supraclavicular or axillary adenopathy.  Respiratory: lungs were clear bilaterally without wheezing or crackles.  Cardiovascular:  Regular rate and rhythm, S1/S2, without murmur, rub or gallop.  There was 1+ bilateral pedal edema.  GI:  abdomen was soft, flat, nontender, nondistended, without organomegaly.  Muscoloskeletal:  no spinal tenderness of palpation of vertebral spine.  Skin exam was without echymosis, petichae.  Neuro exam was nonfocal.  He needed help to get up from his wheelchair.  He needed total assistance from 2 people to get on and off exam table.  Gait was weak and unsteady after taking on a few steps.  Patient was alerted and oriented.  Attention was good.   Language was appropriate.  Mood was normal without depression.  Speech was not pressured.  Thought content was not tangential.      LABORATORY/RADIOLOGY DATA:  Lab Results  Component Value Date   WBC 6.4 03/30/2012   HGB 10.0*  03/30/2012   HCT 31.0* 03/30/2012   PLT 215 03/30/2012   GLUCOSE 304* 03/30/2012   ALKPHOS 63 03/18/2012   ALT 8 03/18/2012   AST 8 03/18/2012   NA 136 03/30/2012   K 3.6 03/30/2012   CL 96* 03/30/2012   CREATININE 8.8 Repeated and Verified* 03/30/2012   BUN 25.6 03/30/2012   CO2 28 03/30/2012   PSA 9.32* 01/28/2011   INR 1.31 03/19/2012   HGBA1C 8.8* 03/19/2012    Dg Bone Survey Met  03/20/2012  *RADIOLOGY REPORT*  Clinical Data: Suspected myeloma, evaluate  for lytic bone lesions  METASTATIC BONE SURVEY  Comparison: Prior chest x-ray 03/19/2012; prior CT abdomen and pelvis 01/03/2009, prior osseous survey 01/28/2011  Findings: The calvarium is intact.  No focal lytic lesion identified.  Relatively poor dentition with multiple missing maxillary teeth.  The sinuses are well-aerated.  Surgical changes of C4-C5 anterior cervical discectomy and fusion with anterior plate and vertebral body screw construct.  No evidence of hardware complication.  There are degenerative spondylitic changes above and below the level of fixation.  There is a suggestion of a soft tissue mass creating a concave defect in the anterior aspect of the C2 vertebral body.  This appears slightly more prominent than seen on 01/28/2011.  Right IJ approach tunneled hemodialysis access catheter.  The catheter tip projects over the right atrium. Elevation of the right hemidiaphragm.  Moderate cardiomegaly.  Atherosclerosis in the transverse aorta. Mild vascular congestion without edema.  No evidence of lytic lesion in the thoracic or lumbar spine.  There are degenerative changes and facet arthropathy scattered throughout.  The pelvis appears diffusely osteopenic.  No focal lytic lesion. Mild degenerative changes in the bilateral hip joints.  Scattered atherosclerotic vascular calcifications throughout the extremity arteries.  No focal lytic lesions in the bilateral upper or lower extremity osseous structures.  Degenerative changes in the bilateral  acromioclavicular and glenohumeral joints.  IMPRESSION:  1.  No lytic lesion identified.  2.  Suggestion of a soft tissue mass anterior to the C2 vertebral body which appears slightly more prominent than seen on 01/28/2011. This may represent an artifact related to the adjacent degenerative changes.  CT scan of the cervical spine could further evaluate if clinically warranted.   Original Report Authenticated By: Malachy Moan, M.D.        ASSESSMENT AND PLAN:    1.  Diagnosis:  Multiple myeloma. 2.  Causes:  Progression of MGUS to active myeloma.  3.  Patient education:   Most of the time, myeloma is a slow growing cancer of the plasma cell in the bone marrow.  Untreated, it can cause:  Hypercalcemia, renal failure, anemia, and bone fracture.  Having one of these 4 criteria indicates need for treatment.   4.  Stage:  Not classicaly staged like solid cancer; but rather according to bone marrow cytogenetics and whether there are indication to treat.  5.  Treatment:    *  Oral + SQ chemo:  Revlimid 5mg  pill (days 1-21); Velcade subcutaneous once a week (3 weeks on, 1 week off);  Dexamethasone 20mg  (once a week); every 4 week-cycle.  Dexamethasone and Revlimid were dose reduced due to his fluid status and deconditioning.    *  In patients who are reasonably in good health, we may consider autologous bone marrow transplant to improve chance of cure.   6.  Potential side effects of these chemo:  Low blood count, bleeding, infection, neuropathy, skin rash, blood clot.   7.  Chance of response:  Upward to 70-80% depending on risk stratification.    8.  Mr. Loftin and his daughter expressed informed understanding and wished to proceed.   We went ahead and fill out paper work for [employer] today.  9.  How to follow response:  M-spike (on SPEP) and serum light chain should decrease with chemo.   The end stage kidney disease may not improved; meaning that you may be on dialysis long term.  However,  without treatment, it can cause other issues such as low blood count, bone fracture.  10.  Deconditioning:  I referred him to [facility] to see if he can get home PT/OT to improve his strength.  I also requested a wheelchair.   11.  ESRD:  On dialysis on Tue/Thu/Sat.   12.  In preparation for treatment, I referred to the following:  *  Chemo class.  *  Fill Rx for Compazine prn nausea; Dexamethasone 20mg  PO once a week; Acyclovir 200mg  PO BID to decrease risk of viral infection; Coumadin 5mg  PO qhs to prevent blood clot.     Follow up:  I will see him in about 10 days to go over last minute questions before starting chemo and supportive meds.     The length of time of the face-to-face encounter was 40 minutes. More than 50% of time was spent counseling and coordination of care.

## 2012-03-30 NOTE — Progress Notes (Signed)
Faxed Revlimid Rx to [facility] @ [employer] Patient support [phone] phone number is [phone] ext [phone].

## 2012-03-30 NOTE — Progress Notes (Signed)
Checked in new pt with no financial concerns. °

## 2012-03-30 NOTE — Progress Notes (Signed)
Pt enrolled in Revlimid REMS program.  Berkley Harvey #[id number]. Rx and Pt assistance application form given to Thelma Barge in managed care dept.Marland Kitchen

## 2012-03-30 NOTE — Progress Notes (Signed)
Called Jack [name] and Jack [name] w/ [facility] regarding new orders for PHT and wheelchair.

## 2012-03-31 NOTE — Progress Notes (Signed)
Received Bone Marrow Report from [facility]; forwarded to Dr. Gaylyn Rong.

## 2012-04-01 ENCOUNTER — Other Ambulatory Visit: Payer: Self-pay | Admitting: Oncology

## 2012-04-01 ENCOUNTER — Encounter: Payer: Self-pay | Admitting: Oncology

## 2012-04-01 ENCOUNTER — Telehealth: Payer: Self-pay | Admitting: *Deleted

## 2012-04-01 NOTE — Telephone Encounter (Signed)
Call from Funding Dept at [employer],  Mindy at ph#[phone], wanting to verify pt's ph #.  Called back and left her VM w/ pt's home and cell numbers as we have them listed.

## 2012-04-01 NOTE — Progress Notes (Signed)
Faxed pa form for revlimid to [facility].

## 2012-04-03 ENCOUNTER — Encounter: Payer: Self-pay | Admitting: *Deleted

## 2012-04-03 NOTE — Progress Notes (Signed)
Received fax from [facility] (ph#[phone])  they shipped pt's Revlimid on 3/19 for delivery on 3/20. Also fax rec'd from [facility] [employer] pt accepted for assistance w/ Revlimid for total $10,000 thru 03/31/2013.

## 2012-04-07 ENCOUNTER — Other Ambulatory Visit: Payer: Medicare Other

## 2012-04-07 ENCOUNTER — Encounter: Payer: Self-pay | Admitting: *Deleted

## 2012-04-07 NOTE — Patient Instructions (Addendum)
1.  Diagnosis:  Multiple myeloma.  2.  Treatment:  Each cycle is 4 weeks long.   * Oral Revlimid 5mg  pill (days 1-21); then 7 days off *  Velcade subcutaneous once a week (3 weeks on, 1 week off);  *  Dexamethasone 20mg  (once a week) (take five of the 4mg -tablets once a week in the morning); every week including the week off of chemo.  * Potential side effects of these chemo: Low blood count, bleeding, infection, neuropathy, skin rash, blood clot.  * Chance of response: Upward to 70-80%.   3.  Supportive care:  * take  Acyclovir 200mg  by mouth twice daily to decrease risk of viral infection (everyday) * Coumadin 5mg  by mouth every night to prevent blood clot while on chemo (everyday).  * Compazine (aka Prochloroperazine) 10mg  tablet once every 6 hours AS NEEDED for nausea/vomiting.    4.  Follow up:  In 1 week.

## 2012-04-08 ENCOUNTER — Telehealth: Payer: Self-pay | Admitting: Oncology

## 2012-04-08 ENCOUNTER — Telehealth: Payer: Self-pay | Admitting: *Deleted

## 2012-04-08 ENCOUNTER — Ambulatory Visit (HOSPITAL_BASED_OUTPATIENT_CLINIC_OR_DEPARTMENT_OTHER): Payer: Medicare Other

## 2012-04-08 ENCOUNTER — Ambulatory Visit (HOSPITAL_BASED_OUTPATIENT_CLINIC_OR_DEPARTMENT_OTHER): Payer: Medicare Other | Admitting: Oncology

## 2012-04-08 ENCOUNTER — Other Ambulatory Visit (HOSPITAL_BASED_OUTPATIENT_CLINIC_OR_DEPARTMENT_OTHER): Payer: Medicare Other | Admitting: Lab

## 2012-04-08 VITALS — BP 105/57 | HR 55 | Temp 97.1°F | Resp 20 | Ht 70.0 in | Wt 194.5 lb

## 2012-04-08 DIAGNOSIS — C9000 Multiple myeloma not having achieved remission: Secondary | ICD-10-CM

## 2012-04-08 DIAGNOSIS — N186 End stage renal disease: Secondary | ICD-10-CM

## 2012-04-08 DIAGNOSIS — E119 Type 2 diabetes mellitus without complications: Secondary | ICD-10-CM

## 2012-04-08 DIAGNOSIS — Z5112 Encounter for antineoplastic immunotherapy: Secondary | ICD-10-CM

## 2012-04-08 DIAGNOSIS — Z7901 Long term (current) use of anticoagulants: Secondary | ICD-10-CM

## 2012-04-08 LAB — CBC WITH DIFFERENTIAL/PLATELET
Basophils Absolute: 0 10*3/uL (ref 0.0–0.1)
Eosinophils Absolute: 0.2 10*3/uL (ref 0.0–0.5)
HCT: 33.3 % — ABNORMAL LOW (ref 38.4–49.9)
HGB: 11 g/dL — ABNORMAL LOW (ref 13.0–17.1)
MCH: 29.3 pg (ref 27.2–33.4)
MONO#: 0.7 10*3/uL (ref 0.1–0.9)
NEUT#: 3.4 10*3/uL (ref 1.5–6.5)
NEUT%: 67.5 % (ref 39.0–75.0)
WBC: 5 10*3/uL (ref 4.0–10.3)
lymph#: 0.8 10*3/uL — ABNORMAL LOW (ref 0.9–3.3)

## 2012-04-08 LAB — BASIC METABOLIC PANEL (CC13)
CO2: 30 mEq/L — ABNORMAL HIGH (ref 22–29)
Chloride: 94 mEq/L — ABNORMAL LOW (ref 98–107)
Potassium: 3.9 mEq/L (ref 3.5–5.1)
Sodium: 136 mEq/L (ref 136–145)

## 2012-04-08 LAB — PROTIME-INR: INR: 1 — ABNORMAL LOW (ref 2.00–3.50)

## 2012-04-08 MED ORDER — BORTEZOMIB CHEMO SQ INJECTION 3.5 MG (2.5MG/ML)
1.3000 mg/m2 | Freq: Once | INTRAMUSCULAR | Status: AC
  Administered 2012-04-08: 2.75 mg via SUBCUTANEOUS
  Filled 2012-04-08: qty 2.75

## 2012-04-08 MED ORDER — ONDANSETRON HCL 8 MG PO TABS
8.0000 mg | ORAL_TABLET | Freq: Once | ORAL | Status: AC
  Administered 2012-04-08: 8 mg via ORAL

## 2012-04-08 NOTE — Patient Instructions (Signed)
[facility] Discharge Instructions for Patients Receiving Chemotherapy  Today you received the following chemotherapy agents :  Velcade.  To help prevent nausea and vomiting after your treatment, we encourage you to take your nausea medication as instructed by your physician.    If you develop nausea and vomiting that is not controlled by your nausea medication, call the clinic. If it is after clinic hours your family physician or the after hours number for the clinic or go to the Emergency Department.   BELOW ARE SYMPTOMS THAT SHOULD BE REPORTED IMMEDIATELY:  *FEVER GREATER THAN 100.5 F  *CHILLS WITH OR WITHOUT FEVER  NAUSEA AND VOMITING THAT IS NOT CONTROLLED WITH YOUR NAUSEA MEDICATION  *UNUSUAL SHORTNESS OF BREATH  *UNUSUAL BRUISING OR BLEEDING  TENDERNESS IN MOUTH AND THROAT WITH OR WITHOUT PRESENCE OF ULCERS  *URINARY PROBLEMS  *BOWEL PROBLEMS  UNUSUAL RASH Items with * indicate a potential emergency and should be followed up as soon as possible.  One of the nurses will contact you 24 hours after your treatment. Please let the nurse know about any problems that you may have experienced. Feel free to call the clinic you have any questions or concerns. The clinic phone number is [phone].   I have been informed and understand all the instructions given to me. I know to contact the clinic, my physician, or go to the Emergency Department if any problems should occur. I do not have any questions at this time, but understand that I may call the clinic during office hours   should I have any questions or need assistance in obtaining follow up care.    __________________________________________  _____________  __________ Signature of Patient or Authorized Representative            Date                   Time    __________________________________________ Nurse's Signature    

## 2012-04-08 NOTE — Telephone Encounter (Signed)
Per staff phone call and POF I have schedueld appts.  JMW  

## 2012-04-08 NOTE — Progress Notes (Signed)
Patient has blood sugar of 345 today; lab result given to [facility] @ Dr.Veita Bland's office (PCP) [phone].

## 2012-04-08 NOTE — Progress Notes (Signed)
[facility]  Telephone:(336) [phone] Fax:(336) [phone]   OFFICE PROGRESS NOTE   Cc:  Geraldo Pitter, MD  DIAGNOSIS:  MGUS progression to IgG kappa multiple myeloma; presenting with anemia, ESRD.  Bone marrow biopsy on 03/23/2012 showed 12% plasma cell.  FISH and cytogenetics are pending.   CURRENT THERAPY:  Due to start chemo Revlimid/Velcade/Dexamethasone today 3/37/2014.    INTERVAL HISTORY: Jack [name] 67 y.o. male returns for regular follow up with a sister.  He reported doing better than 1 month ago.  He still has fatigue; however, he is able to ambulate more than before. He is still dependent on his family for heavy activities chores. He still has not had physical therapy evaluation and a wheelchair delivered to him yet.  He is no longer SOB, or abdominal distension.  His pedal edema has also improved.    Past Medical History  Diagnosis Date  . Renal disorder   . Hypertension   . Diabetes mellitus without complication   . Shortness of breath   . Pneumonia     2012  . Heart murmur   . Glaucoma   . Multiple myeloma, without mention of having achieved remission(203.00) 03/30/2012    Cytogenetic neg on 03/23/2012.    Past Surgical History  Procedure Laterality Date  . Thyroidectomy    . Cervical disc surgery    . Eye surgery      CATARACTS  . Insertion of dialysis catheter Right 03/19/2012    Procedure: INSERTION OF DIALYSIS CATHETER;  Surgeon: Pryor Ochoa, MD;  Location: [facility] OR;  Service: Vascular;  Laterality: Right;  Right Internal Jugular  . Av fistula placement Left 03/25/2012    Procedure: ARTERIOVENOUS (AV) FISTULA CREATION;  Surgeon: Pryor Ochoa, MD;  Location: [facility] OR;  Service: Vascular;  Laterality: Left;    Current Outpatient Prescriptions  Medication Sig Dispense Refill  . acyclovir (ZOVIRAX) 200 MG capsule Take 1 capsule (200 mg total) by mouth 2 (two) times daily.  60 capsule  3  . aspirin EC 81 MG tablet Take 81 mg by mouth  daily.      . calcitRIOL (ROCALTROL) 0.25 MCG capsule Take 0.25 mcg by mouth daily.      . calcium acetate (PHOSLO) 667 MG capsule Take 1 capsule (667 mg total) by mouth 3 (three) times daily with meals.  9 capsule  0  . darbepoetin (ARANESP) 60 MCG/0.3ML SOLN Inject 0.3 mLs (60 mcg total) into the vein every Thursday with hemodialysis.  4.2 mL    . dexamethasone (DECADRON) 4 MG tablet Take 5 tablets (20 mg total) by mouth once a week.  100 tablet  3  . doxazosin (CARDURA) 4 MG tablet Take 4 mg by mouth at bedtime.      Marland Kitchen doxercalciferol (HECTOROL) 4 MCG/2ML injection Inject 1 mL (2 mcg total) into the vein Every Tuesday,Thursday,and Saturday with dialysis.  2 mL    . hydrALAZINE (APRESOLINE) 25 MG tablet Take 25 mg by mouth 3 (three) times daily.      . isosorbide mononitrate (IMDUR) 30 MG 24 hr tablet Take 1 tablet (30 mg total) by mouth daily.  30 tablet  0  . lenalidomide (REVLIMID) 5 MG capsule Take 1 capsule (5 mg total) by mouth daily.  21 capsule  0  . levothyroxine (SYNTHROID, LEVOTHROID) 175 MCG tablet Take 175 mcg by mouth daily.      . multivitamin (RENA-VIT) TABS tablet Take 1 tablet by mouth at bedtime.  30 tablet  0  . nebivolol (BYSTOLIC) 10 MG tablet Take 10 mg by mouth daily.      Marland Kitchen oxyCODONE (ROXICODONE) 5 MG immediate release tablet Take 1 tablet (5 mg total) by mouth every 6 (six) hours as needed for pain.  30 tablet  0  . prochlorperazine (COMPAZINE) 10 MG tablet Take 1 tablet (10 mg total) by mouth every 6 (six) hours as needed.  30 tablet  3  . saxagliptin HCl (ONGLYZA) 5 MG TABS tablet Take 5 mg by mouth daily.      Marland Kitchen warfarin (COUMADIN) 5 MG tablet Take 1 tablet (5 mg total) by mouth daily.  30 tablet  3   No current facility-administered medications for this visit.    ALLERGIES:  is allergic to ivp dye and morphine and related.  REVIEW OF SYSTEMS:  The rest of the 14-point review of system was negative.   Filed Vitals:   04/08/12 1040  BP: 105/57  Pulse: 55    Temp: 97.1 F (36.2 C)  Resp: 20   Wt Readings from Last 3 Encounters:  04/08/12 194 lb 8 oz (88.225 kg)  03/30/12 196 lb (88.905 kg)  03/24/12 193 lb 5.5 oz (87.7 kg)   ECOG Performance status: 2  PHYSICAL EXAMINATION:   General:  well-nourished man, tired appearing, in no acute distress.  Eyes:  no scleral icterus.  ENT:  There were no oropharyngeal lesions.  Neck was without thyromegaly.  Lymphatics:  Negative cervical, supraclavicular or axillary adenopathy.  Respiratory: lungs were clear bilaterally without wheezing or crackles.  Cardiovascular:  Regular rate and rhythm, S1/S2, without murmur, rub or gallop.  There was 1+ bilateral pedal edema.  GI:  abdomen was soft, flat, nontender, nondistended, without organomegaly.  Muscoloskeletal:  no spinal tenderness of palpation of vertebral spine.  Skin exam was without echymosis, petichae.  Neuro exam was nonfocal.  He needed help to get up from his wheelchair.  He needed minor assistance to get on and off exam table.  Gait was weak and unsteady after taking on a few steps.  Patient was alert and oriented.  Attention was good.   Language was appropriate.  Mood was normal without depression.  Speech was not pressured.  Thought content was not tangential.      LABORATORY/RADIOLOGY DATA:  Lab Results  Component Value Date   WBC 5.0 04/08/2012   HGB 11.0* 04/08/2012   HCT 33.3* 04/08/2012   PLT 87* 04/08/2012   GLUCOSE 345* 04/08/2012   ALKPHOS 63 03/18/2012   ALT 8 03/18/2012   AST 8 03/18/2012   NA 136 04/08/2012   K 3.9 04/08/2012   CL 94* 04/08/2012   CREATININE 7.7 Repeated and Verified* 04/08/2012   BUN 20.9 04/08/2012   CO2 30* 04/08/2012   PSA 9.32* 01/28/2011   INR 1.00* 04/08/2012   HGBA1C 8.8* 03/19/2012      ASSESSMENT AND PLAN:   1.  Diagnosis:  Multiple myeloma.  -Treatment: chemotherapy.  Each cycle is 4 weeks long.   * Oral Revlimid 5mg  pill (days 1-21); then 7 days off *  Velcade subcutaneous once a week (3 weeks on, 1 week  off);  *  Dexamethasone 20mg  (once a week) (take five of the 4mg -tablets once a week in the morning); every week including the week off of chemo.  * Potential side effects of these chemo: Low blood count, bleeding, infection, neuropathy, skin rash, blood clot.  Mr. Salmons and his sister expressed informed understanding and wished  to start chemo today.   * Chance of response: Upward to 70-80%.   - Supportive care:  * take  Acyclovir 200mg  by mouth twice daily to decrease risk of viral infection (everyday) * Coumadin 5mg  by mouth every night to prevent blood clot while on chemo (everyday).  * Compazine (aka Prochloroperazine) 10mg  tablet once every 6 hours AS NEEDED for nausea/vomiting.   -  Follow up:  In 1 week.   2.  Deconditioning:  We contacted home care and reminded them again for PT eval and wheelchair.   3.  ESRD:  On dialysis on Tue/Thu/Sat.   4.  Diabetes mellitus; type II:  His cbc was 300's today and this is before he starts dexamethasone.   He is on saxagliptin.  His CBC will get worse with Dexamethasone.  I advised him to contact his PCP today for instruction.  5.  Hypertension:  He is on Hydralazine, Isosorbide; nebivolol.  6.  Hypothyroidism:  He is on Levothyroxine.   The length of time of the face-to-face encounter was 25 minutes. More than 50% of time was spent counseling and coordination of care.

## 2012-04-09 ENCOUNTER — Encounter: Payer: Self-pay | Admitting: Oncology

## 2012-04-09 ENCOUNTER — Telehealth: Payer: Self-pay | Admitting: *Deleted

## 2012-04-09 NOTE — Telephone Encounter (Signed)
Message copied by Augusto Garbe on Thu Apr 09, 2012  1:17 PM ------      Message from: Normajean Baxter LE      Created: Wed Apr 08, 2012  1:07 PM      Regarding: Chemo f/u call       First time SQ Velcade,  Takes Revlimid at home,  Dr. Gaylyn Rong, TB, RN. ------

## 2012-04-09 NOTE — Telephone Encounter (Signed)
Called patient's home number for chemotherapy f/u.  Patient is not home or available per person who answered mobile number as well.  Requested a return call if any questions or symptoms to report.  Encouraged to drink lots of fluids.  Awaiting a return call  From Pt.

## 2012-04-09 NOTE — Progress Notes (Signed)
[facility], [id number], approved revlimid from 04/01/12-10/02/12.

## 2012-04-10 ENCOUNTER — Telehealth: Payer: Self-pay | Admitting: *Deleted

## 2012-04-10 NOTE — Telephone Encounter (Signed)
Pt returned call from Triage nurse for chemo f/u.  He denies any n/v/d and states he is feeling "very well."  Instructed to call for any fevers, unrelieved n/v or diarrhea.  He verbalized understanding.

## 2012-04-13 ENCOUNTER — Telehealth: Payer: Self-pay | Admitting: Dietician

## 2012-04-15 ENCOUNTER — Telehealth: Payer: Self-pay | Admitting: Oncology

## 2012-04-15 ENCOUNTER — Other Ambulatory Visit (HOSPITAL_BASED_OUTPATIENT_CLINIC_OR_DEPARTMENT_OTHER): Payer: Medicare Other

## 2012-04-15 ENCOUNTER — Ambulatory Visit (HOSPITAL_BASED_OUTPATIENT_CLINIC_OR_DEPARTMENT_OTHER): Payer: Medicare Other

## 2012-04-15 ENCOUNTER — Ambulatory Visit (HOSPITAL_BASED_OUTPATIENT_CLINIC_OR_DEPARTMENT_OTHER): Payer: Medicare Other | Admitting: Oncology

## 2012-04-15 VITALS — BP 85/50 | HR 62 | Temp 99.1°F | Resp 20 | Ht 70.0 in | Wt 188.3 lb

## 2012-04-15 DIAGNOSIS — C9000 Multiple myeloma not having achieved remission: Secondary | ICD-10-CM

## 2012-04-15 DIAGNOSIS — I959 Hypotension, unspecified: Secondary | ICD-10-CM

## 2012-04-15 DIAGNOSIS — N186 End stage renal disease: Secondary | ICD-10-CM

## 2012-04-15 DIAGNOSIS — Z5112 Encounter for antineoplastic immunotherapy: Secondary | ICD-10-CM

## 2012-04-15 DIAGNOSIS — Z992 Dependence on renal dialysis: Secondary | ICD-10-CM

## 2012-04-15 LAB — CBC WITH DIFFERENTIAL/PLATELET
Basophils Absolute: 0 10*3/uL (ref 0.0–0.1)
EOS%: 3.7 % (ref 0.0–7.0)
Eosinophils Absolute: 0.3 10*3/uL (ref 0.0–0.5)
HCT: 34.6 % — ABNORMAL LOW (ref 38.4–49.9)
HGB: 11 g/dL — ABNORMAL LOW (ref 13.0–17.1)
MCH: 27.8 pg (ref 27.2–33.4)
MCV: 87.5 fL (ref 79.3–98.0)
MONO%: 6.9 % (ref 0.0–14.0)
NEUT#: 5.6 10*3/uL (ref 1.5–6.5)
NEUT%: 77.7 % — ABNORMAL HIGH (ref 39.0–75.0)
lymph#: 0.8 10*3/uL — ABNORMAL LOW (ref 0.9–3.3)

## 2012-04-15 LAB — COMPREHENSIVE METABOLIC PANEL (CC13)
ALT: 9 U/L (ref 0–55)
AST: 13 U/L (ref 5–34)
Albumin: 2.7 g/dL — ABNORMAL LOW (ref 3.5–5.0)
CO2: 28 mEq/L (ref 22–29)
Calcium: 8.8 mg/dL (ref 8.4–10.4)
Chloride: 93 mEq/L — ABNORMAL LOW (ref 98–107)
Creatinine: 7.1 mg/dL (ref 0.7–1.3)
Potassium: 3.5 mEq/L (ref 3.5–5.1)

## 2012-04-15 LAB — PROTHROMBIN TIME: Prothrombin Time: 66.2 seconds — ABNORMAL HIGH (ref 11.6–15.2)

## 2012-04-15 LAB — PROTIME-INR

## 2012-04-15 MED ORDER — BORTEZOMIB CHEMO SQ INJECTION 3.5 MG (2.5MG/ML)
1.3000 mg/m2 | Freq: Once | INTRAMUSCULAR | Status: AC
  Administered 2012-04-15: 2.75 mg via SUBCUTANEOUS
  Filled 2012-04-15: qty 2.75

## 2012-04-15 MED ORDER — ONDANSETRON HCL 8 MG PO TABS
8.0000 mg | ORAL_TABLET | Freq: Once | ORAL | Status: AC
  Administered 2012-04-15: 8 mg via ORAL

## 2012-04-15 NOTE — Progress Notes (Signed)
Contacted Kristen @ [facility] [phone]) and wheelchair will be delivered today to patient's home.  Daughter Denyse [phone]) verbalized understanding.

## 2012-04-15 NOTE — Progress Notes (Signed)
[facility]  Telephone:(336) [phone] Fax:(336) [phone]   OFFICE PROGRESS NOTE   Cc:  Geraldo Pitter, MD  DIAGNOSIS:  MGUS progression to IgG kappa multiple myeloma; presenting with anemia, ESRD.  Bone marrow biopsy on 03/23/2012 showed 12% plasma cell.  FISH and cytogenetics are pending.   CURRENT THERAPY:  Started chemo Revlimid/Velcade/Dexamethasone on 3/37/2014.    INTERVAL HISTORY: Jack [name] 67 y.o. male returns for regular follow up with a sister and a daughter.  He reported no significant change his stamina starting chemo last week.  His strength is still better than when he was first discharged. However, he still is very weak.  He is able to ambulate by himself using a walking around the house but not yet outside.  He is still waiting for home PT/OT.  He has had hypotensive episodes with dialysis the last few days.  He is still taking multiple antihypertensive meds that were prescribed when he left the hospital recently.  He denied fever, SOB, chest pain, abd pain, pedal edema, neuropathy, cough, bleeding symptoms. The rest of the 14-point review of system was negative.    Past Medical History  Diagnosis Date  . Renal disorder   . Hypertension   . Diabetes mellitus without complication   . Shortness of breath   . Pneumonia     2012  . Heart murmur   . Glaucoma   . Multiple myeloma, without mention of having achieved remission(203.00) 03/30/2012    Cytogenetic neg on 03/23/2012.    Past Surgical History  Procedure Laterality Date  . Thyroidectomy    . Cervical disc surgery    . Eye surgery      CATARACTS  . Insertion of dialysis catheter Right 03/19/2012    Procedure: INSERTION OF DIALYSIS CATHETER;  Surgeon: Pryor Ochoa, MD;  Location: [facility] OR;  Service: Vascular;  Laterality: Right;  Right Internal Jugular  . Av fistula placement Left 03/25/2012    Procedure: ARTERIOVENOUS (AV) FISTULA CREATION;  Surgeon: Pryor Ochoa, MD;  Location: [facility] OR;   Service: Vascular;  Laterality: Left;    Current Outpatient Prescriptions  Medication Sig Dispense Refill  . acyclovir (ZOVIRAX) 200 MG capsule Take 1 capsule (200 mg total) by mouth 2 (two) times daily.  60 capsule  3  . aspirin EC 81 MG tablet Take 81 mg by mouth daily.      . calcitRIOL (ROCALTROL) 0.25 MCG capsule Take 0.25 mcg by mouth daily.      . calcium acetate (PHOSLO) 667 MG capsule Take 1 capsule (667 mg total) by mouth 3 (three) times daily with meals.  9 capsule  0  . darbepoetin (ARANESP) 60 MCG/0.3ML SOLN Inject 0.3 mLs (60 mcg total) into the vein every Thursday with hemodialysis.  4.2 mL    . dexamethasone (DECADRON) 4 MG tablet Take 5 tablets (20 mg total) by mouth once a week.  100 tablet  3  . doxazosin (CARDURA) 4 MG tablet Take 4 mg by mouth at bedtime.      Marland Kitchen doxercalciferol (HECTOROL) 4 MCG/2ML injection Inject 1 mL (2 mcg total) into the vein Every Tuesday,Thursday,and Saturday with dialysis.  2 mL    . hydrALAZINE (APRESOLINE) 25 MG tablet Take 25 mg by mouth 3 (three) times daily.      . isosorbide mononitrate (IMDUR) 30 MG 24 hr tablet Take 1 tablet (30 mg total) by mouth daily.  30 tablet  0  . lenalidomide (REVLIMID) 5 MG capsule  Take 1 capsule (5 mg total) by mouth daily.  21 capsule  0  . levothyroxine (SYNTHROID, LEVOTHROID) 175 MCG tablet Take 175 mcg by mouth daily.      . multivitamin (RENA-VIT) TABS tablet Take 1 tablet by mouth at bedtime.  30 tablet  0  . nebivolol (BYSTOLIC) 10 MG tablet Take 10 mg by mouth daily.      Marland Kitchen oxyCODONE (ROXICODONE) 5 MG immediate release tablet Take 1 tablet (5 mg total) by mouth every 6 (six) hours as needed for pain.  30 tablet  0  . prochlorperazine (COMPAZINE) 10 MG tablet Take 1 tablet (10 mg total) by mouth every 6 (six) hours as needed.  30 tablet  3  . saxagliptin HCl (ONGLYZA) 5 MG TABS tablet Take 5 mg by mouth daily.      Marland Kitchen warfarin (COUMADIN) 5 MG tablet Take 1 tablet (5 mg total) by mouth daily.  30 tablet  3    No current facility-administered medications for this visit.    ALLERGIES:  is allergic to ivp dye and morphine and related.   Filed Vitals:   04/15/12 1237  BP: 85/50  Pulse: 62  Temp: 99.1 F (37.3 C)  Resp: 20   Wt Readings from Last 3 Encounters:  04/15/12 188 lb 4.8 oz (85.412 kg)  04/08/12 194 lb 8 oz (88.225 kg)  03/30/12 196 lb (88.905 kg)   ECOG Performance status: 2  PHYSICAL EXAMINATION:   General:  well-nourished man, tired appearing, in no acute distress.  Eyes:  no scleral icterus.  ENT:  There were no oropharyngeal lesions.  Neck was without thyromegaly.  Lymphatics:  Negative cervical, supraclavicular or axillary adenopathy.  Respiratory: lungs were clear bilaterally without wheezing or crackles.  Cardiovascular:  Regular rate and rhythm, S1/S2, without murmur, rub or gallop.  There was 1+ bilateral pedal edema.  GI:  abdomen was soft, flat, nontender, nondistended, without organomegaly.  Muscoloskeletal:  no spinal tenderness of palpation of vertebral spine.  Skin exam was without echymosis, petichae.  Neuro exam was nonfocal.  Patient was alert and oriented.  Attention was good.   Language was appropriate.  Mood was normal without depression.  Speech was not pressured.  Thought content was not tangential.      LABORATORY/RADIOLOGY DATA:  Lab Results  Component Value Date   WBC 7.2 04/15/2012   HGB 11.0* 04/15/2012   HCT 34.6* 04/15/2012   PLT 156 04/15/2012   GLUCOSE 178* 04/15/2012   ALKPHOS 65 04/15/2012   ALT 9 04/15/2012   AST 13 04/15/2012   NA 134* 04/15/2012   K 3.5 04/15/2012   CL 93* 04/15/2012   CREATININE 7.1 Repeated and Verified* 04/15/2012   BUN 22.8 04/15/2012   CO2 28 04/15/2012   PSA 9.32* 01/28/2011   INR Sent out for confirmation 04/15/2012   INR 8.91* 04/15/2012   HGBA1C 8.8* 03/19/2012      ASSESSMENT AND PLAN:   1.  Diagnosis:  Multiple myeloma.  - He is s/p one week of Rev/Velcade/Dex.   - He is tolerating chemo well.  Will proceed with cycle#1,  day#8 today.  I will see him again next week to make sure that he is doing well.    - Supportive care:  * take  Acyclovir 200mg  by mouth twice daily to decrease risk of viral infection (everyday) * I advised him to STOP coumadin today since INR was elevated.  There is no active bleeding.  There is no indication for reversal.  We will decide what to do with his Coumadin next visit.  He cannot take Lovenox due to ESRD but does need anticoagulant to prevent Revlimid-induced thrombosis.  He is high risk of thrombosis due to immobility and high myeloma burden.  * Compazine (aka Prochloroperazine) 10mg  tablet once every 6 hours AS NEEDED for nausea/vomiting.   -  Follow up:  In 1 week.   2.  Deconditioning:  We again contacted home care and reminded them for PT/OT eval and wheelchair.   3.  ESRD:  On dialysis on Tue/Thu/Sat.   4.  Diabetes mellitus; type II:  His cbc was 300's today and this is before he starts dexamethasone.   He is on saxagliptin.  His CBC will get worse with Dexamethasone.  I advised him to contact his PCP today for instruction.  5.  Hypotension:  He has been on on Hydralazine, Isosorbide; nebivolol.  I advised him to stop hydralazine and isosorbide for now.  If his blood pressure is still low, we will taper off of nebivolol next visit.   6.  Hypothyroidism:  He is on Levothyroxine.   The length of time of the face-to-face encounter was 15 minutes. More than 50% of time was spent counseling and coordination of care.

## 2012-04-15 NOTE — Patient Instructions (Addendum)
Cancer Center Discharge Instructions for Patients Receiving Chemotherapy  Today you received the following chemotherapy agents Velcade.  To help prevent nausea and vomiting after your treatment, we encourage you to take your nausea medication as prescribed.    If you develop nausea and vomiting that is not controlled by your nausea medication, call the clinic. If it is after clinic hours your family physician or the after hours number for the clinic or go to the Emergency Department.   BELOW ARE SYMPTOMS THAT SHOULD BE REPORTED IMMEDIATELY:  *FEVER GREATER THAN 100.5 F  *CHILLS WITH OR WITHOUT FEVER  NAUSEA AND VOMITING THAT IS NOT CONTROLLED WITH YOUR NAUSEA MEDICATION  *UNUSUAL SHORTNESS OF BREATH  *UNUSUAL BRUISING OR BLEEDING  TENDERNESS IN MOUTH AND THROAT WITH OR WITHOUT PRESENCE OF ULCERS  *URINARY PROBLEMS  *BOWEL PROBLEMS  UNUSUAL RASH Items with * indicate a potential emergency and should be followed up as soon as possible.  Please let the nurse know about any problems that you may have experienced. Feel free to call the clinic you have any questions or concerns. The clinic phone number is [phone].   I have been informed and understand all the instructions given to me. I know to contact the clinic, my physician, or go to the Emergency Department if any problems should occur. I do not have any questions at this time, but understand that I may call the clinic during office hours   should I have any questions or need assistance in obtaining follow up care.    __________________________________________  _____________  __________ Signature of Patient or Authorized Representative            Date                   Time    __________________________________________ Nurse's Signature    

## 2012-04-15 NOTE — Telephone Encounter (Signed)
Gave pt appt for lab, chemo and MD for April and May 2014 °

## 2012-04-16 ENCOUNTER — Telehealth: Payer: Self-pay | Admitting: *Deleted

## 2012-04-16 NOTE — Telephone Encounter (Signed)
Pt left VM states has some changes to medications and requested nurse call him back.  I called back and go his VM. Left him msg to return call again.

## 2012-04-17 ENCOUNTER — Encounter: Payer: Self-pay | Admitting: Oncology

## 2012-04-17 ENCOUNTER — Telehealth: Payer: Self-pay | Admitting: Dietician

## 2012-04-17 NOTE — Telephone Encounter (Signed)
Brief Outpatient Oncology Nutrition Note  Patient has been identified to be at risk on malnutrition screen.   Wt Readings from Last 10 Encounters:  04/15/12 188 lb 4.8 oz (85.412 kg)  04/08/12 194 lb 8 oz (88.225 kg)  03/30/12 196 lb (88.905 kg)  03/24/12 193 lb 5.5 oz (87.7 kg)  03/24/12 193 lb 5.5 oz (87.7 kg)  02/28/11 212 lb 14.4 oz (96.571 kg)  01/28/11 213 lb 11.2 oz (96.934 kg)    Called and spoke with patient's daughter who reported that patient has been eating well.  Have noted however that patient has lost 12% of his usual weight since mid January.   Stressed the importance of weight maintenance and good nutrition.  Patient has not tried Nepro.  Discussed use with daughter.    Encouraged follow-up with RD at the HD center or Cancer Center as needed.  Oran Rein, RD, LDN Clinical Inpatient Dietitian Pager:  [phone] Weekend and after hours pager:  [phone]

## 2012-04-22 ENCOUNTER — Encounter: Payer: Self-pay | Admitting: Oncology

## 2012-04-22 ENCOUNTER — Ambulatory Visit (HOSPITAL_BASED_OUTPATIENT_CLINIC_OR_DEPARTMENT_OTHER): Payer: Medicare Other

## 2012-04-22 ENCOUNTER — Other Ambulatory Visit (HOSPITAL_BASED_OUTPATIENT_CLINIC_OR_DEPARTMENT_OTHER): Payer: Medicare Other | Admitting: Lab

## 2012-04-22 VITALS — BP 122/56 | HR 52 | Temp 97.8°F | Resp 20

## 2012-04-22 DIAGNOSIS — Z7901 Long term (current) use of anticoagulants: Secondary | ICD-10-CM

## 2012-04-22 DIAGNOSIS — C9000 Multiple myeloma not having achieved remission: Secondary | ICD-10-CM

## 2012-04-22 DIAGNOSIS — Z5112 Encounter for antineoplastic immunotherapy: Secondary | ICD-10-CM

## 2012-04-22 LAB — CBC WITH DIFFERENTIAL/PLATELET
BASO%: 0.9 % (ref 0.0–2.0)
EOS%: 7 % (ref 0.0–7.0)
HCT: 33.6 % — ABNORMAL LOW (ref 38.4–49.9)
LYMPH%: 15.9 % (ref 14.0–49.0)
MCH: 27.6 pg (ref 27.2–33.4)
MCHC: 32.1 g/dL (ref 32.0–36.0)
MONO#: 0.7 10*3/uL (ref 0.1–0.9)
NEUT%: 65 % (ref 39.0–75.0)
Platelets: 172 10*3/uL (ref 140–400)

## 2012-04-22 LAB — PROTIME-INR

## 2012-04-22 MED ORDER — ONDANSETRON HCL 8 MG PO TABS
8.0000 mg | ORAL_TABLET | Freq: Once | ORAL | Status: AC
  Administered 2012-04-22: 8 mg via ORAL

## 2012-04-22 MED ORDER — BORTEZOMIB CHEMO SQ INJECTION 3.5 MG (2.5MG/ML)
1.3000 mg/m2 | Freq: Once | INTRAMUSCULAR | Status: AC
  Administered 2012-04-22: 2.75 mg via SUBCUTANEOUS
  Filled 2012-04-22: qty 2.75

## 2012-04-22 NOTE — Patient Instructions (Addendum)
[facility] Discharge Instructions for Patients Receiving Chemotherapy  Today you received the following chemotherapy agents :  Velcade.  To help prevent nausea and vomiting after your treatment, we encourage you to take your nausea medication as directed.   If you develop nausea and vomiting that is not controlled by your nausea medication, call the clinic. If it is after clinic hours your family physician or the after hours number for the clinic or go to the Emergency Department.   BELOW ARE SYMPTOMS THAT SHOULD BE REPORTED IMMEDIATELY:  *FEVER GREATER THAN 100.5 F  *CHILLS WITH OR WITHOUT FEVER  NAUSEA AND VOMITING THAT IS NOT CONTROLLED WITH YOUR NAUSEA MEDICATION  *UNUSUAL SHORTNESS OF BREATH  *UNUSUAL BRUISING OR BLEEDING  TENDERNESS IN MOUTH AND THROAT WITH OR WITHOUT PRESENCE OF ULCERS  *URINARY PROBLEMS  *BOWEL PROBLEMS  UNUSUAL RASH Items with * indicate a potential emergency and should be followed up as soon as possible.   Feel free to call the clinic you have any questions or concerns. The clinic phone number is [phone].   I have been informed and understand all the instructions given to me. I know to contact the clinic, my physician, or go to the Emergency Department if any problems should occur. I do not have any questions at this time, but understand that I may call the clinic during office hours   should I have any questions or need assistance in obtaining follow up care.    __________________________________________  _____________  __________ Signature of Patient or Authorized Representative            Date                   Time    __________________________________________ Nurse's Signature

## 2012-04-23 LAB — CHROMOSOME ANALYSIS, BONE MARROW

## 2012-04-27 ENCOUNTER — Other Ambulatory Visit: Payer: Self-pay | Admitting: *Deleted

## 2012-04-27 ENCOUNTER — Other Ambulatory Visit: Payer: Self-pay | Admitting: Oncology

## 2012-04-27 DIAGNOSIS — C9000 Multiple myeloma not having achieved remission: Secondary | ICD-10-CM

## 2012-04-27 MED ORDER — LENALIDOMIDE 5 MG PO CAPS: 5.0000 mg | ORAL_CAPSULE | Freq: Every day | ORAL | Status: DC

## 2012-04-29 ENCOUNTER — Other Ambulatory Visit (HOSPITAL_BASED_OUTPATIENT_CLINIC_OR_DEPARTMENT_OTHER): Payer: Medicare Other

## 2012-04-29 DIAGNOSIS — Z7901 Long term (current) use of anticoagulants: Secondary | ICD-10-CM

## 2012-04-29 DIAGNOSIS — C9000 Multiple myeloma not having achieved remission: Secondary | ICD-10-CM

## 2012-04-29 LAB — COMPREHENSIVE METABOLIC PANEL (CC13)
ALT: 17 U/L (ref 0–55)
AST: 15 U/L (ref 5–34)
Alkaline Phosphatase: 82 U/L (ref 40–150)
Calcium: 9.8 mg/dL (ref 8.4–10.4)
Chloride: 98 mEq/L (ref 98–107)
Creatinine: 6.4 mg/dL (ref 0.7–1.3)
Total Bilirubin: 0.46 mg/dL (ref 0.20–1.20)

## 2012-04-29 LAB — CBC WITH DIFFERENTIAL/PLATELET
Eosinophils Absolute: 0.4 10*3/uL (ref 0.0–0.5)
HCT: 35.6 % — ABNORMAL LOW (ref 38.4–49.9)
LYMPH%: 19.8 % (ref 14.0–49.0)
MONO#: 0.7 10*3/uL (ref 0.1–0.9)
NEUT#: 2.8 10*3/uL (ref 1.5–6.5)
NEUT%: 55.5 % (ref 39.0–75.0)
Platelets: 142 10*3/uL (ref 140–400)
RBC: 4.07 10*6/uL — ABNORMAL LOW (ref 4.20–5.82)
WBC: 5.1 10*3/uL (ref 4.0–10.3)

## 2012-04-29 LAB — PROTIME-INR
INR: 2 (ref 2.00–3.50)
Protime: 24 Seconds — ABNORMAL HIGH (ref 10.6–13.4)

## 2012-04-30 LAB — PROTEIN ELECTROPHORESIS, SERUM
Alpha-1-Globulin: 5.6 % — ABNORMAL HIGH (ref 2.9–4.9)
Alpha-2-Globulin: 10.7 % (ref 7.1–11.8)
Beta Globulin: 3.8 % — ABNORMAL LOW (ref 4.7–7.2)
M-Spike, %: 0.78 g/dL
Total Protein, Serum Electrophoresis: 6.5 g/dL (ref 6.0–8.3)

## 2012-04-30 LAB — KAPPA/LAMBDA LIGHT CHAINS: Lambda Free Lght Chn: 4.55 mg/dL — ABNORMAL HIGH (ref 0.57–2.63)

## 2012-05-01 ENCOUNTER — Encounter: Payer: Self-pay | Admitting: *Deleted

## 2012-05-01 NOTE — Progress Notes (Signed)
Fax from [employer] ph#[phone].  They will dispense Revlimid and $0 copay to pt.

## 2012-05-04 ENCOUNTER — Encounter: Payer: Self-pay | Admitting: Vascular Surgery

## 2012-05-04 NOTE — Patient Instructions (Addendum)
1.  Multiple myeloma. 2.  Treatment:  Revlimid/Velcade/Dexamethasone.   3.  Response to chemo.   4.  Recommendation:  Continue with chemo.  5.  Supportive care:  Remember to take Acyclovir to decrease chance of viral infection; and Coumadin to decrease risk of blood clot.

## 2012-05-05 ENCOUNTER — Encounter (INDEPENDENT_AMBULATORY_CARE_PROVIDER_SITE_OTHER): Payer: Medicare Other | Admitting: *Deleted

## 2012-05-05 ENCOUNTER — Ambulatory Visit (INDEPENDENT_AMBULATORY_CARE_PROVIDER_SITE_OTHER): Payer: Medicare Other | Admitting: Vascular Surgery

## 2012-05-05 ENCOUNTER — Other Ambulatory Visit: Payer: Self-pay

## 2012-05-05 ENCOUNTER — Encounter: Payer: Self-pay | Admitting: Vascular Surgery

## 2012-05-05 VITALS — BP 163/72 | HR 50 | Temp 98.3°F

## 2012-05-05 DIAGNOSIS — Z48812 Encounter for surgical aftercare following surgery on the circulatory system: Secondary | ICD-10-CM | POA: Insufficient documentation

## 2012-05-05 DIAGNOSIS — Z4931 Encounter for adequacy testing for hemodialysis: Secondary | ICD-10-CM

## 2012-05-05 DIAGNOSIS — T82898A Other specified complication of vascular prosthetic devices, implants and grafts, initial encounter: Secondary | ICD-10-CM

## 2012-05-05 DIAGNOSIS — N186 End stage renal disease: Secondary | ICD-10-CM

## 2012-05-05 DIAGNOSIS — Z992 Dependence on renal dialysis: Secondary | ICD-10-CM | POA: Insufficient documentation

## 2012-05-05 NOTE — Progress Notes (Signed)
Subjective:     Patient ID: Jack [name], male   DOB: 04/27/1945, 66 y.o.   MRN: [id number]  HPI this 66-year-old male with end-stage renal disease returns to have left AV fistula-brachials acephalic checked today. This was created by me 6 weeks ago. He had no pain or numbness in left hand. He is on hemodialysis through a catheter in the right IJ and dialyzes Tuesday Thursday and Saturday.  Past Medical History  Diagnosis Date  . Renal disorder   . Hypertension   . Diabetes mellitus without complication   . Shortness of breath   . Pneumonia     2012  . Heart murmur   . Glaucoma   . Multiple myeloma, without mention of having achieved remission(203.00) 03/30/2012    Cytogenetic neg on 03/23/2012.    History  Substance Use Topics  . Smoking status: Former Smoker    Quit date: 03/19/1982  . Smokeless tobacco: Never Used  . Alcohol Use: Yes     Comment: daily    No family history on file.  Allergies  Allergen Reactions  . Ivp Dye (Iodinated Diagnostic Agents) Hives  . Morphine And Related Other (See Comments)    Bradycardia states patient    Current outpatient prescriptions:acyclovir (ZOVIRAX) 200 MG capsule, Take 1 capsule (200 mg total) by mouth 2 (two) times daily., Disp: 60 capsule, Rfl: 3;  aspirin EC 81 MG tablet, Take 81 mg by mouth daily., Disp: , Rfl: ;  calcitRIOL (ROCALTROL) 0.25 MCG capsule, Take 0.25 mcg by mouth daily., Disp: , Rfl: ;  calcium acetate (PHOSLO) 667 MG capsule, Take 1 capsule (667 mg total) by mouth 3 (three) times daily with meals., Disp: 9 capsule, Rfl: 0 darbepoetin (ARANESP) 60 MCG/0.3ML SOLN, Inject 0.3 mLs (60 mcg total) into the vein every Thursday with hemodialysis., Disp: 4.2 mL, Rfl: ;  dexamethasone (DECADRON) 4 MG tablet, Take 5 tablets (20 mg total) by mouth once a week., Disp: 100 tablet, Rfl: 3;  doxercalciferol (HECTOROL) 4 MCG/2ML injection, Inject 1 mL (2 mcg total) into the vein Every Tuesday,Thursday,and Saturday with dialysis.,  Disp: 2 mL, Rfl:  lenalidomide (REVLIMID) 5 MG capsule, Take 1 capsule (5 mg total) by mouth daily., Disp: 21 capsule, Rfl: 0;  levothyroxine (SYNTHROID, LEVOTHROID) 175 MCG tablet, Take 175 mcg by mouth daily., Disp: , Rfl: ;  multivitamin (RENA-VIT) TABS tablet, Take 1 tablet by mouth at bedtime., Disp: 30 tablet, Rfl: 0;  nebivolol (BYSTOLIC) 10 MG tablet, Take 10 mg by mouth daily., Disp: , Rfl:  prochlorperazine (COMPAZINE) 10 MG tablet, Take 1 tablet (10 mg total) by mouth every 6 (six) hours as needed., Disp: 30 tablet, Rfl: 3;  saxagliptin HCl (ONGLYZA) 5 MG TABS tablet, Take 5 mg by mouth daily., Disp: , Rfl: ;  warfarin (COUMADIN) 5 MG tablet, Take 1 tablet (5 mg total) by mouth daily., Disp: 30 tablet, Rfl: 3;  doxazosin (CARDURA) 4 MG tablet, Take 4 mg by mouth at bedtime., Disp: , Rfl:  hydrALAZINE (APRESOLINE) 25 MG tablet, Take 25 mg by mouth 3 (three) times daily., Disp: , Rfl: ;  isosorbide mononitrate (IMDUR) 30 MG 24 hr tablet, Take 1 tablet (30 mg total) by mouth daily., Disp: 30 tablet, Rfl: 0;  oxyCODONE (ROXICODONE) 5 MG immediate release tablet, Take 1 tablet (5 mg total) by mouth every 6 (six) hours as needed for pain., Disp: 30 tablet, Rfl: 0  BP 163/72  Pulse 50  Temp(Src) 98.3 F (36.8 C) (Oral)  There is no   weight on file to calculate BMI.           Review of Systems denies chest pain, hemoptysis, claudication    Objective:   Physical Exam blood pressure 163/72 heart rate 50 respirations 18 Gen.-alert and oriented x3 in no apparent distress HEENT normal for age Lungs no rhonchi or wheezing Cardiovascular regular rhythm no murmurs carotid pulses 3+ palpable no bruits audible Abdomen soft nontender no palpable masses Musculoskeletal free of  major deformities Skin clear -no rashes Neurologic normal Lower extremities 3+ femoral and dorsalis pedis pulses palpable bilaterally with no edema Left upper extremity with excellent pulse and palpable thrill and  brachial cephalic AV fistula. 2+ radial pulse distally. Today I ordered venous duplex exam of the left brachiocephalic fistula. There are 2 or 3 branches the most significant of which arises about 5-6 cm from the anastomosis to the radial artery in the distal upper arm.      Assessment:     End-stage renal disease with a nicely functioning left upper arm AV fistula with 2 or 3 competing branches    Plan:     Ligation of competing branches left brachial cephalic AV fistula-we'll schedule this for Friday, April 25      

## 2012-05-06 ENCOUNTER — Ambulatory Visit (HOSPITAL_BASED_OUTPATIENT_CLINIC_OR_DEPARTMENT_OTHER): Payer: Medicare Other | Admitting: Oncology

## 2012-05-06 ENCOUNTER — Other Ambulatory Visit (HOSPITAL_BASED_OUTPATIENT_CLINIC_OR_DEPARTMENT_OTHER): Payer: Medicare Other

## 2012-05-06 ENCOUNTER — Encounter (HOSPITAL_COMMUNITY): Payer: Self-pay | Admitting: Pharmacy Technician

## 2012-05-06 ENCOUNTER — Other Ambulatory Visit: Payer: Medicare Other | Admitting: Lab

## 2012-05-06 ENCOUNTER — Ambulatory Visit (HOSPITAL_BASED_OUTPATIENT_CLINIC_OR_DEPARTMENT_OTHER): Payer: Medicare Other

## 2012-05-06 ENCOUNTER — Telehealth: Payer: Self-pay | Admitting: Oncology

## 2012-05-06 VITALS — BP 130/69 | HR 62 | Temp 97.2°F | Resp 18 | Ht 70.0 in | Wt 189.4 lb

## 2012-05-06 DIAGNOSIS — Z5112 Encounter for antineoplastic immunotherapy: Secondary | ICD-10-CM

## 2012-05-06 DIAGNOSIS — C9000 Multiple myeloma not having achieved remission: Secondary | ICD-10-CM

## 2012-05-06 DIAGNOSIS — I82409 Acute embolism and thrombosis of unspecified deep veins of unspecified lower extremity: Secondary | ICD-10-CM

## 2012-05-06 DIAGNOSIS — Z7901 Long term (current) use of anticoagulants: Secondary | ICD-10-CM

## 2012-05-06 DIAGNOSIS — N186 End stage renal disease: Secondary | ICD-10-CM

## 2012-05-06 DIAGNOSIS — I120 Hypertensive chronic kidney disease with stage 5 chronic kidney disease or end stage renal disease: Secondary | ICD-10-CM

## 2012-05-06 DIAGNOSIS — E119 Type 2 diabetes mellitus without complications: Secondary | ICD-10-CM

## 2012-05-06 LAB — CBC WITH DIFFERENTIAL/PLATELET
Basophils Absolute: 0.2 10*3/uL — ABNORMAL HIGH (ref 0.0–0.1)
EOS%: 1.7 % (ref 0.0–7.0)
Eosinophils Absolute: 0.1 10*3/uL (ref 0.0–0.5)
HCT: 37.9 % — ABNORMAL LOW (ref 38.4–49.9)
HGB: 11.6 g/dL — ABNORMAL LOW (ref 13.0–17.1)
MCH: 28.1 pg (ref 27.2–33.4)
MONO#: 0.7 10*3/uL (ref 0.1–0.9)
NEUT#: 3.4 10*3/uL (ref 1.5–6.5)
NEUT%: 58.1 % (ref 39.0–75.0)
lymph#: 1.4 10*3/uL (ref 0.9–3.3)

## 2012-05-06 LAB — PROTIME-INR

## 2012-05-06 MED ORDER — ONDANSETRON HCL 8 MG PO TABS
8.0000 mg | ORAL_TABLET | Freq: Once | ORAL | Status: AC
  Administered 2012-05-06: 8 mg via ORAL

## 2012-05-06 MED ORDER — BORTEZOMIB CHEMO SQ INJECTION 3.5 MG (2.5MG/ML)
1.3000 mg/m2 | Freq: Once | INTRAMUSCULAR | Status: AC
  Administered 2012-05-06: 2.75 mg via SUBCUTANEOUS
  Filled 2012-05-06: qty 2.75

## 2012-05-06 NOTE — Progress Notes (Signed)
[facility]  Telephone:(336) [phone] Fax:(336) [phone]   OFFICE PROGRESS NOTE   Cc:  Geraldo Pitter, MD  DIAGNOSIS:  MGUS progression to IgG kappa multiple myeloma; presenting with anemia, ESRD.  Bone marrow biopsy on 03/23/2012 showed 12% plasma cell.  FISH and cytogenetics are pending.   CURRENT THERAPY:  Started chemo Revlimid/Velcade/Dexamethasone on 3/37/2014.    INTERVAL HISTORY: Jack [name] 67 y.o. male returns for regular follow up with his daughter.  He reported feeling stronger with PT.  He is able to walk short distant from room to room without any assistance.  He was able to walk outside of the house yesterday.  He is still getting dialysis without problem.  He no longer has SOB, DOE, pedal edema.  He denied fever, neuropathy, fatigue, infection from chemo.   Patient denies fever, anorexia, weight loss, headache, visual changes, confusion, drenching night sweats, palpable lymph node swelling, mucositis, odynophagia, dysphagia, nausea vomiting, jaundice, chest pain, palpitation, shortness of breath, dyspnea on exertion, productive cough, gum bleeding, epistaxis, hematemesis, hemoptysis, abdominal pain, abdominal swelling, early satiety, melena, hematochezia, hematuria, skin rash, spontaneous bleeding, joint swelling, joint pain, heat or cold intolerance, bowel bladder incontinence, back pain, focal motor weakness, paresthesia, depression.     Past Medical History  Diagnosis Date  . Renal disorder   . Hypertension   . Diabetes mellitus without complication   . Shortness of breath   . Pneumonia     2012  . Heart murmur   . Glaucoma   . Multiple myeloma, without mention of having achieved remission(203.00) 03/30/2012    Cytogenetic neg on 03/23/2012.    Past Surgical History  Procedure Laterality Date  . Thyroidectomy    . Cervical disc surgery    . Eye surgery      CATARACTS  . Insertion of dialysis catheter Right 03/19/2012    Procedure:  INSERTION OF DIALYSIS CATHETER;  Surgeon: Pryor Ochoa, MD;  Location: [facility] OR;  Service: Vascular;  Laterality: Right;  Right Internal Jugular  . Av fistula placement Left 03/25/2012    Procedure: ARTERIOVENOUS (AV) FISTULA CREATION;  Surgeon: Pryor Ochoa, MD;  Location: [facility] OR;  Service: Vascular;  Laterality: Left;    Current Outpatient Prescriptions  Medication Sig Dispense Refill  . acyclovir (ZOVIRAX) 200 MG capsule Take 1 capsule (200 mg total) by mouth 2 (two) times daily.  60 capsule  3  . aspirin EC 81 MG tablet Take 81 mg by mouth daily.      . calcitRIOL (ROCALTROL) 0.25 MCG capsule Take 0.25 mcg by mouth daily.      . calcium acetate (PHOSLO) 667 MG capsule Take 1 capsule (667 mg total) by mouth 3 (three) times daily with meals.  9 capsule  0  . dexamethasone (DECADRON) 4 MG tablet Take 5 tablets (20 mg total) by mouth once a week.  100 tablet  3  . isosorbide mononitrate (IMDUR) 30 MG 24 hr tablet Take 1 tablet (30 mg total) by mouth daily.  30 tablet  0  . lenalidomide (REVLIMID) 5 MG capsule Take 1 capsule (5 mg total) by mouth daily.  21 capsule  0  . levothyroxine (SYNTHROID, LEVOTHROID) 175 MCG tablet Take 175 mcg by mouth daily.      . multivitamin (RENA-VIT) TABS tablet Take 1 tablet by mouth at bedtime.  30 tablet  0  . nebivolol (BYSTOLIC) 10 MG tablet Take 10 mg by mouth daily.      Marland Kitchen warfarin (  COUMADIN) 5 MG tablet Take 1 tablet (5 mg total) by mouth daily.  30 tablet  3  . prochlorperazine (COMPAZINE) 10 MG tablet Take 1 tablet (10 mg total) by mouth every 6 (six) hours as needed.  30 tablet  3  . saxagliptin HCl (ONGLYZA) 5 MG TABS tablet Take 5 mg by mouth daily.       No current facility-administered medications for this visit.    ALLERGIES:  is allergic to ivp dye and morphine and related.   Filed Vitals:   05/06/12 1028  BP: 130/69  Pulse: 62  Temp: 97.2 F (36.2 C)  Resp: 18   Wt Readings from Last 3 Encounters:  05/06/12 189 lb 6.4 oz (85.911 kg)    04/15/12 188 lb 4.8 oz (85.412 kg)  04/08/12 194 lb 8 oz (88.225 kg)   ECOG Performance status: 1-2  PHYSICAL EXAMINATION:   General:  well-nourished man, tired appearing, in no acute distress.  Eyes:  no scleral icterus.  ENT:  There were no oropharyngeal lesions.  Neck was without thyromegaly.  Lymphatics:  Negative cervical, supraclavicular or axillary adenopathy.  Respiratory: lungs were clear bilaterally without wheezing or crackles.  Cardiovascular:  Regular rate and rhythm, S1/S2, without murmur, rub or gallop.  There was 1+ bilateral pedal edema.  GI:  abdomen was soft, flat, nontender, nondistended, without organomegaly.  Muscoloskeletal:  no spinal tenderness of palpation of vertebral spine.  Skin exam was without echymosis, petichae.  Neuro exam was nonfocal.  Patient was alert and oriented.  Attention was good.   Language was appropriate.  Mood was normal without depression.  Speech was not pressured.  Thought content was not tangential.  He was able to get on exam table without assistance.     LABORATORY/RADIOLOGY DATA:  Lab Results  Component Value Date   WBC 5.9 05/06/2012   HGB 11.6* 05/06/2012   HCT 37.9* 05/06/2012   PLT 235 05/06/2012   GLUCOSE 329* 04/29/2012   ALKPHOS 82 04/29/2012   ALT 17 04/29/2012   AST 15 04/29/2012   NA 140 04/29/2012   K 3.4* 04/29/2012   CL 98 04/29/2012   CREATININE 6.4 Repeated and Verified* 04/29/2012   BUN 18.1 04/29/2012   CO2 31* 04/29/2012   PSA 9.32* 01/28/2011   INR 1.10* 05/06/2012   HGBA1C 8.8* 03/19/2012      ASSESSMENT AND PLAN:   1.  Diagnosis:  Multiple myeloma.  - He is tolerating chemo well with response in decreasing M-spike and light chain.  He had no dose-limiting toxicity.  - I advised him to proceed with the 2nd cycle of chemo without dose modification. - I referred him to [facility] to be evaluated for autologous BMT.   - Supportive care:  * Continue Acyclovir 200mg  by mouth twice daily to decrease risk of viral infection  (everyday) * He is off of Coumadin pending fistula revision on Friday 05/08/12.  I instructed him to resume Coumadin on 05/09/12 if there is no bleeding from the revision.  We will check INR weekly until it is stabilized.  * Compazine (aka Prochloroperazine) 10mg  tablet once every 6 hours AS NEEDED for nausea/vomiting.   -  Follow up:  Weekly for Velcade SQ chemo. Return visit in 4 weeks before the 3rd cycle of chemo.     2.  Deconditioning:  Improved with PT.  He is able to ambulate more independently now.    3.  ESRD:  On dialysis on Tue/Thu/Sat.   4.  Diabetes  mellitus; type II:  He is on saxagliptin per PCP. His glucose is still elevated.  I deferred to his PCP for further titration as appropriate.   5.  Hypertension:  He is on nebivolol.   6.  Hypothyroidism:  He is on Levothyroxine.    Huan T. Gaylyn Rong, M.D.

## 2012-05-06 NOTE — Patient Instructions (Signed)
[facility] Discharge Instructions for Patients Receiving Chemotherapy  Today you received the following chemotherapy agents Velcade  To help prevent nausea and vomiting after your treatment, we encourage you to take your nausea medication as directed.   If you develop nausea and vomiting that is not controlled by your nausea medication, call the clinic. If it is after clinic hours your family physician or the after hours number for the clinic or go to the Emergency Department.   BELOW ARE SYMPTOMS THAT SHOULD BE REPORTED IMMEDIATELY:  *FEVER GREATER THAN 100.5 F  *CHILLS WITH OR WITHOUT FEVER  NAUSEA AND VOMITING THAT IS NOT CONTROLLED WITH YOUR NAUSEA MEDICATION  *UNUSUAL SHORTNESS OF BREATH  *UNUSUAL BRUISING OR BLEEDING  TENDERNESS IN MOUTH AND THROAT WITH OR WITHOUT PRESENCE OF ULCERS  *URINARY PROBLEMS  *BOWEL PROBLEMS  UNUSUAL RASH Items with * indicate a potential emergency and should be followed up as soon as possible.  One of the nurses will contact you 24 hours after your treatment. Please let the nurse know about any problems that you may have experienced. Feel free to call the clinic you have any questions or concerns. The clinic phone number is [phone].   I have been informed and understand all the instructions given to me. I know to contact the clinic, my physician, or go to the Emergency Department if any problems should occur. I do not have any questions at this time, but understand that I may call the clinic during office hours   should I have any questions or need assistance in obtaining follow up care.    __________________________________________  _____________  __________ Signature of Patient or Authorized Representative            Date                   Time    __________________________________________ Nurse's Signature    

## 2012-05-06 NOTE — Telephone Encounter (Signed)
Pt given appt schedule for April thru July. Tiffany in HIM aware of [facility] referral.

## 2012-05-07 ENCOUNTER — Encounter (HOSPITAL_COMMUNITY): Payer: Self-pay | Admitting: *Deleted

## 2012-05-07 ENCOUNTER — Telehealth: Payer: Self-pay | Admitting: Oncology

## 2012-05-07 MED ORDER — DEXTROSE 5 % IV SOLN
1.5000 g | INTRAVENOUS | Status: AC
  Administered 2012-05-08: 1.5 g via INTRAVENOUS
  Filled 2012-05-07: qty 1.5

## 2012-05-07 NOTE — Telephone Encounter (Signed)
Pt appt. With Dr. Gretel Acre @ [place] 05/18/12@11 :45. Medical records faxed,slides and scans will be fedex'ed. Pt is aware

## 2012-05-08 ENCOUNTER — Encounter (HOSPITAL_COMMUNITY): Payer: Self-pay | Admitting: Anesthesiology

## 2012-05-08 ENCOUNTER — Encounter (HOSPITAL_COMMUNITY)
Admission: RE | Disposition: A | Discharge status: Home / Self Care | Payer: Self-pay | Source: Ambulatory Visit | Attending: Vascular Surgery

## 2012-05-08 ENCOUNTER — Ambulatory Visit (HOSPITAL_COMMUNITY)
Admission: RE | Admit: 2012-05-08 | Discharge: 2012-05-08 | Disposition: A | Discharge status: Home / Self Care | Payer: Medicare Other | Source: Ambulatory Visit | Attending: Vascular Surgery | Admitting: Vascular Surgery

## 2012-05-08 ENCOUNTER — Telehealth: Payer: Self-pay | Admitting: Vascular Surgery

## 2012-05-08 ENCOUNTER — Ambulatory Visit (HOSPITAL_COMMUNITY): Payer: Medicare Other | Admitting: Anesthesiology

## 2012-05-08 DIAGNOSIS — Y832 Surgical operation with anastomosis, bypass or graft as the cause of abnormal reaction of the patient, or of later complication, without mention of misadventure at the time of the procedure: Secondary | ICD-10-CM | POA: Insufficient documentation

## 2012-05-08 DIAGNOSIS — N186 End stage renal disease: Secondary | ICD-10-CM | POA: Insufficient documentation

## 2012-05-08 DIAGNOSIS — T82898A Other specified complication of vascular prosthetic devices, implants and grafts, initial encounter: Secondary | ICD-10-CM | POA: Insufficient documentation

## 2012-05-08 DIAGNOSIS — Z87891 Personal history of nicotine dependence: Secondary | ICD-10-CM | POA: Insufficient documentation

## 2012-05-08 DIAGNOSIS — Z7901 Long term (current) use of anticoagulants: Secondary | ICD-10-CM | POA: Insufficient documentation

## 2012-05-08 DIAGNOSIS — Z79899 Other long term (current) drug therapy: Secondary | ICD-10-CM | POA: Insufficient documentation

## 2012-05-08 DIAGNOSIS — M1990 Unspecified osteoarthritis, unspecified site: Secondary | ICD-10-CM | POA: Insufficient documentation

## 2012-05-08 DIAGNOSIS — I120 Hypertensive chronic kidney disease with stage 5 chronic kidney disease or end stage renal disease: Secondary | ICD-10-CM | POA: Insufficient documentation

## 2012-05-08 DIAGNOSIS — R011 Cardiac murmur, unspecified: Secondary | ICD-10-CM | POA: Insufficient documentation

## 2012-05-08 DIAGNOSIS — H409 Unspecified glaucoma: Secondary | ICD-10-CM | POA: Insufficient documentation

## 2012-05-08 DIAGNOSIS — C9000 Multiple myeloma not having achieved remission: Secondary | ICD-10-CM | POA: Insufficient documentation

## 2012-05-08 HISTORY — PX: LIGATION OF COMPETING BRANCHES OF ARTERIOVENOUS FISTULA: SHX5949

## 2012-05-08 LAB — POCT I-STAT 4, (NA,K, GLUC, HGB,HCT)
Glucose, Bld: 353 mg/dL — ABNORMAL HIGH (ref 70–99)
HCT: 35 % — ABNORMAL LOW (ref 39.0–52.0)
Hemoglobin: 11.9 g/dL — ABNORMAL LOW (ref 13.0–17.0)

## 2012-05-08 LAB — GLUCOSE, CAPILLARY: Glucose-Capillary: 297 mg/dL — ABNORMAL HIGH (ref 70–99)

## 2012-05-08 LAB — PROTIME-INR: Prothrombin Time: 13.1 seconds (ref 11.6–15.2)

## 2012-05-08 SURGERY — LIGATION OF COMPETING BRANCHES OF ARTERIOVENOUS FISTULA
Anesthesia: Monitor Anesthesia Care | Site: Arm Upper | Laterality: Left | Wound class: Clean

## 2012-05-08 MED ORDER — LIDOCAINE-EPINEPHRINE (PF) 1 %-1:200000 IJ SOLN
INTRAMUSCULAR | Status: AC
  Filled 2012-05-08: qty 10

## 2012-05-08 MED ORDER — MIDAZOLAM HCL 5 MG/5ML IJ SOLN
INTRAMUSCULAR | Status: DC | PRN
  Administered 2012-05-08: 1 mg via INTRAVENOUS

## 2012-05-08 MED ORDER — PROPOFOL 10 MG/ML IV BOLUS
INTRAVENOUS | Status: DC | PRN
  Administered 2012-05-08: 40 mg via INTRAVENOUS
  Administered 2012-05-08: 10 mg via INTRAVENOUS

## 2012-05-08 MED ORDER — FENTANYL CITRATE 0.05 MG/ML IJ SOLN: 25.0000 ug | INTRAMUSCULAR | Status: DC | PRN

## 2012-05-08 MED ORDER — INSULIN ASPART 100 UNIT/ML ~~LOC~~ SOLN
7.0000 [IU] | Freq: Once | SUBCUTANEOUS | Status: AC
  Administered 2012-05-08: 7 [IU] via SUBCUTANEOUS

## 2012-05-08 MED ORDER — FENTANYL CITRATE 0.05 MG/ML IJ SOLN
INTRAMUSCULAR | Status: DC | PRN
  Administered 2012-05-08 (×2): 50 ug via INTRAVENOUS

## 2012-05-08 MED ORDER — SODIUM CHLORIDE 0.9 % IV SOLN
INTRAVENOUS | Status: DC | PRN
  Administered 2012-05-08: 07:00:00 via INTRAVENOUS

## 2012-05-08 MED ORDER — MUPIROCIN CALCIUM 2 % EX CREA
TOPICAL_CREAM | Freq: Two times a day (BID) | CUTANEOUS | Status: DC
  Filled 2012-05-08: qty 15

## 2012-05-08 MED ORDER — METOCLOPRAMIDE HCL 5 MG/ML IJ SOLN: 10.0000 mg | Freq: Once | INTRAMUSCULAR | Status: DC | PRN

## 2012-05-08 MED ORDER — LIDOCAINE-EPINEPHRINE (PF) 1 %-1:200000 IJ SOLN
INTRAMUSCULAR | Status: DC | PRN
  Administered 2012-05-08: 30 mL

## 2012-05-08 MED ORDER — MUPIROCIN 2 % EX OINT
TOPICAL_OINTMENT | CUTANEOUS | Status: AC
  Administered 2012-05-08: 07:00:00 via NASAL
  Filled 2012-05-08: qty 22

## 2012-05-08 MED ORDER — SODIUM CHLORIDE 0.9 % IV SOLN: INTRAVENOUS | Status: DC

## 2012-05-08 MED ORDER — 0.9 % SODIUM CHLORIDE (POUR BTL) OPTIME
TOPICAL | Status: DC | PRN
  Administered 2012-05-08: 1000 mL

## 2012-05-08 MED ORDER — SODIUM CHLORIDE 0.9 % IR SOLN
Status: DC | PRN
  Administered 2012-05-08: 08:00:00

## 2012-05-08 SURGICAL SUPPLY — 34 items
CANISTER SUCTION 2500CC (MISCELLANEOUS) ×2 IMPLANT
CLIP TI MEDIUM 6 (CLIP) ×2 IMPLANT
CLIP TI WIDE RED SMALL 6 (CLIP) ×2 IMPLANT
CLOTH BEACON ORANGE TIMEOUT ST (SAFETY) ×2 IMPLANT
COVER PROBE W GEL 5X96 (DRAPES) ×2 IMPLANT
COVER SURGICAL LIGHT HANDLE (MISCELLANEOUS) ×2 IMPLANT
DERMABOND ADVANCED (GAUZE/BANDAGES/DRESSINGS) ×1
DERMABOND ADVANCED .7 DNX12 (GAUZE/BANDAGES/DRESSINGS) ×1 IMPLANT
ELECT REM PT RETURN 9FT ADLT (ELECTROSURGICAL) ×2
ELECTRODE REM PT RTRN 9FT ADLT (ELECTROSURGICAL) ×1 IMPLANT
GEL ULTRASOUND 20GR AQUASONIC (MISCELLANEOUS) ×2 IMPLANT
GLOVE BIOGEL PI IND STRL 6.5 (GLOVE) ×1 IMPLANT
GLOVE BIOGEL PI INDICATOR 6.5 (GLOVE) ×1
GLOVE ECLIPSE 7.5 STRL STRAW (GLOVE) ×2 IMPLANT
GLOVE SS BIOGEL STRL SZ 7 (GLOVE) ×1 IMPLANT
GLOVE SUPERSENSE BIOGEL SZ 7 (GLOVE) ×1
GOWN STRL NON-REIN LRG LVL3 (GOWN DISPOSABLE) ×2 IMPLANT
GOWN STRL REIN XL XLG (GOWN DISPOSABLE) ×2 IMPLANT
KIT BASIN OR (CUSTOM PROCEDURE TRAY) ×2 IMPLANT
KIT ROOM TURNOVER OR (KITS) ×2 IMPLANT
NS IRRIG 1000ML POUR BTL (IV SOLUTION) ×2 IMPLANT
PACK CV ACCESS (CUSTOM PROCEDURE TRAY) ×2 IMPLANT
PAD ARMBOARD 7.5X6 YLW CONV (MISCELLANEOUS) ×4 IMPLANT
SPONGE GAUZE 4X4 12PLY (GAUZE/BANDAGES/DRESSINGS) IMPLANT
SUT PROLENE 6 0 BV (SUTURE) IMPLANT
SUT SILK 0 TIES 10X30 (SUTURE) ×2 IMPLANT
SUT VIC AB 3-0 SH 27 (SUTURE) ×1
SUT VIC AB 3-0 SH 27X BRD (SUTURE) ×1 IMPLANT
SWAB COLLECTION DEVICE MRSA (MISCELLANEOUS) IMPLANT
TOWEL OR 17X24 6PK STRL BLUE (TOWEL DISPOSABLE) ×2 IMPLANT
TOWEL OR 17X26 10 PK STRL BLUE (TOWEL DISPOSABLE) ×2 IMPLANT
TUBE ANAEROBIC SPECIMEN COL (MISCELLANEOUS) IMPLANT
UNDERPAD 30X30 INCONTINENT (UNDERPADS AND DIAPERS) ×2 IMPLANT
WATER STERILE IRR 1000ML POUR (IV SOLUTION) ×2 IMPLANT

## 2012-05-08 NOTE — Interval H&P Note (Signed)
History and Physical Interval Note:  05/08/2012 7:30 AM  Jack [name]  has presented today for surgery, with the diagnosis of End Stage Renal Disease  The various methods of treatment have been discussed with the patient and family. After consideration of risks, benefits and other options for treatment, the patient has consented to  Procedure(s) with comments: LIGATION OF COMPETING BRANCHES OF ARTERIOVENOUS FISTULA (Left) - ligation competing branches left brachial cephalic AVF  as a surgical intervention .  The patient's history has been reviewed, patient examined, no change in status, stable for surgery.  I have reviewed the patient's chart and labs.  Questions were answered to the patient's satisfaction.     Josephina Gip

## 2012-05-08 NOTE — Telephone Encounter (Addendum)
Message copied by Rosalyn Charters on Fri May 08, 2012  9:48 AM ------      Message from: Melene Plan      Created: Fri May 08, 2012  9:18 AM                   ----- Message -----         From: Lars Mage, PA-C         Sent: 05/08/2012   8:14 AM           To: Melene Plan, RN            F/U 4-6 weeks s/p ligation of competing Dr. Hart Rochester  ------ unable to reach patient by phone mailed appt. letter 05-08-12

## 2012-05-08 NOTE — Anesthesia Preprocedure Evaluation (Addendum)
Anesthesia Evaluation  Patient identified by MRN, date of birth, ID band Patient awake    Reviewed: Allergy & Precautions, H&P , NPO status , Patient's Chart, lab work & pertinent test results, reviewed documented beta blocker date and time   Airway Mallampati: II TM Distance: >3 FB Neck ROM: full    Dental  (+) Teeth Intact and Missing,    Pulmonary shortness of breath and with exertion, pneumonia -, resolved,  03-19-12  Chest x-ray   IMPRESSION:   Interval placement of a right IJ approach hemodialysis catheter with the catheter tip at the superior cavoatrial junction.  No evidence of pneumothorax.    breath sounds clear to auscultation        Cardiovascular hypertension, On Medications and On Home Beta Blockers + Valvular Problems/Murmurs Rhythm:Irregular Rate:Normal  18-Mar-2012 09:21:28 [facility]/ED ROUTINE RECORD AGE IS NOT ENTERED, ASSUMED TO BE 67 YEARS OLD FOR PURPOSE OF ECG INTERPRETATION ACCELERATED JUNCTIONAL ESCAPE RHYTHM ~ absent P waves, V-rate 50-70 LEFT ANTERIOR FASCICULAR BLOCK ~ axis(240,-40), init forces inf EARLY PRECORDIAL R/S TRANSITION ~ QRS area positive in V2 CONSIDER ANTERIOR INFARCT ~ diminished R <0.63mV V3 NONSPECIFIC T ABNORMALITIES, LATERAL LEADS ~ T <-0.16mV, I aVL V5 V6 Abnormal ECG 61mm/s 4mm/mV 150Hz  8.0.1 12SL 235 CID: [id number] Referred by: Confirmed By: Bethann Berkshire MD   Neuro/Psych negative neurological ROS  negative psych ROS   GI/Hepatic negative GI ROS, Neg liver ROS,   Endo/Other  diabetes, Poorly ControlledHypothyroidism Glucose 297 @ 07:25  Renal/GU ESRF and DialysisRenal diseaseTu-th-Sat  negative genitourinary   Musculoskeletal  (+) Arthritis -, Osteoarthritis,    Abdominal   Peds  Hematology  (+) anemia ,   Anesthesia Other Findings See surgeon's H&P   Reproductive/Obstetrics negative OB ROS                      Anesthesia  Physical Anesthesia Plan  ASA: III  Anesthesia Plan: MAC   Post-op Pain Management:    Induction: Intravenous  Airway Management Planned:   Additional Equipment:   Intra-op Plan:   Post-operative Plan:   Informed Consent: I have reviewed the patients History and Physical, chart, labs and discussed the procedure including the risks, benefits and alternatives for the proposed anesthesia with the patient or authorized representative who has indicated his/her understanding and acceptance.   Dental Advisory Given  Plan Discussed with: CRNA and Surgeon  Anesthesia Plan Comments:         Anesthesia Quick Evaluation

## 2012-05-08 NOTE — Anesthesia Procedure Notes (Signed)
Procedure Name: MAC Date/Time: 05/08/2012 7:50 AM Performed by: Tyrone Nine Pre-anesthesia Checklist: Patient identified, Emergency Drugs available, Suction available, Patient being monitored and Timeout performed Patient Re-evaluated:Patient Re-evaluated prior to inductionOxygen Delivery Method: Nasal cannula Intubation Type: IV induction Placement Confirmation: positive ETCO2

## 2012-05-08 NOTE — Preoperative (Addendum)
Beta Blockers   Bystolic taken on  05-07-12    @ 2000

## 2012-05-08 NOTE — Anesthesia Postprocedure Evaluation (Signed)
Anesthesia Post Note  Patient: Jack [name]  Procedure(s) Performed: Procedure(s) (LRB): LIGATION OF COMPETING BRANCHES OF ARTERIOVENOUS FISTULA (Left)  Anesthesia type: General  Patient location: PACU  Post pain: Pain level controlled  Post assessment: Patient's Cardiovascular Status Stable  Last Vitals:  Filed Vitals:   05/08/12 0830  BP: 135/57  Pulse:   Temp: 36.7 C  Resp:     Post vital signs: Reviewed and stable  Level of consciousness: alert  Complications: No apparent anesthesia complications

## 2012-05-08 NOTE — Transfer of Care (Signed)
Immediate Anesthesia Transfer of Care Note  Patient: Jack [name]  Procedure(s) Performed: Procedure(s) with comments: LIGATION OF COMPETING BRANCHES OF ARTERIOVENOUS FISTULA (Left) - Ultrasound guided  Patient Location: PACU  Anesthesia Type:MAC  Level of Consciousness: awake, alert , oriented and patient cooperative  Airway & Oxygen Therapy: Patient Spontanous Breathing  Post-op Assessment: Report given to PACU RN and Post -op Vital signs reviewed and stable  Post vital signs: Reviewed and stable  Complications: No apparent anesthesia complications

## 2012-05-08 NOTE — H&P (View-Only) (Signed)
Subjective:     Patient ID: Jack [name], male   DOB: 06/11/1945, 67 y.o.   MRN: [id number]  HPI this 67 year old male with end-stage renal disease returns to have left AV fistula-brachials acephalic checked today. This was created by me 6 weeks ago. He had no pain or numbness in left hand. He is on hemodialysis through a catheter in the right IJ and dialyzes Tuesday Thursday and Saturday.  Past Medical History  Diagnosis Date  . Renal disorder   . Hypertension   . Diabetes mellitus without complication   . Shortness of breath   . Pneumonia     2012  . Heart murmur   . Glaucoma   . Multiple myeloma, without mention of having achieved remission(203.00) 03/30/2012    Cytogenetic neg on 03/23/2012.    History  Substance Use Topics  . Smoking status: Former Smoker    Quit date: 03/19/1982  . Smokeless tobacco: Never Used  . Alcohol Use: Yes     Comment: daily    No family history on file.  Allergies  Allergen Reactions  . Ivp Dye (Iodinated Diagnostic Agents) Hives  . Morphine And Related Other (See Comments)    Bradycardia states patient    Current outpatient prescriptions:acyclovir (ZOVIRAX) 200 MG capsule, Take 1 capsule (200 mg total) by mouth 2 (two) times daily., Disp: 60 capsule, Rfl: 3;  aspirin EC 81 MG tablet, Take 81 mg by mouth daily., Disp: , Rfl: ;  calcitRIOL (ROCALTROL) 0.25 MCG capsule, Take 0.25 mcg by mouth daily., Disp: , Rfl: ;  calcium acetate (PHOSLO) 667 MG capsule, Take 1 capsule (667 mg total) by mouth 3 (three) times daily with meals., Disp: 9 capsule, Rfl: 0 darbepoetin (ARANESP) 60 MCG/0.3ML SOLN, Inject 0.3 mLs (60 mcg total) into the vein every Thursday with hemodialysis., Disp: 4.2 mL, Rfl: ;  dexamethasone (DECADRON) 4 MG tablet, Take 5 tablets (20 mg total) by mouth once a week., Disp: 100 tablet, Rfl: 3;  doxercalciferol (HECTOROL) 4 MCG/2ML injection, Inject 1 mL (2 mcg total) into the vein Every Tuesday,Thursday,and Saturday with dialysis.,  Disp: 2 mL, Rfl:  lenalidomide (REVLIMID) 5 MG capsule, Take 1 capsule (5 mg total) by mouth daily., Disp: 21 capsule, Rfl: 0;  levothyroxine (SYNTHROID, LEVOTHROID) 175 MCG tablet, Take 175 mcg by mouth daily., Disp: , Rfl: ;  multivitamin (RENA-VIT) TABS tablet, Take 1 tablet by mouth at bedtime., Disp: 30 tablet, Rfl: 0;  nebivolol (BYSTOLIC) 10 MG tablet, Take 10 mg by mouth daily., Disp: , Rfl:  prochlorperazine (COMPAZINE) 10 MG tablet, Take 1 tablet (10 mg total) by mouth every 6 (six) hours as needed., Disp: 30 tablet, Rfl: 3;  saxagliptin HCl (ONGLYZA) 5 MG TABS tablet, Take 5 mg by mouth daily., Disp: , Rfl: ;  warfarin (COUMADIN) 5 MG tablet, Take 1 tablet (5 mg total) by mouth daily., Disp: 30 tablet, Rfl: 3;  doxazosin (CARDURA) 4 MG tablet, Take 4 mg by mouth at bedtime., Disp: , Rfl:  hydrALAZINE (APRESOLINE) 25 MG tablet, Take 25 mg by mouth 3 (three) times daily., Disp: , Rfl: ;  isosorbide mononitrate (IMDUR) 30 MG 24 hr tablet, Take 1 tablet (30 mg total) by mouth daily., Disp: 30 tablet, Rfl: 0;  oxyCODONE (ROXICODONE) 5 MG immediate release tablet, Take 1 tablet (5 mg total) by mouth every 6 (six) hours as needed for pain., Disp: 30 tablet, Rfl: 0  BP 163/72  Pulse 50  Temp(Src) 98.3 F (36.8 C) (Oral)  There is no  weight on file to calculate BMI.           Review of Systems denies chest pain, hemoptysis, claudication    Objective:   Physical Exam blood pressure 163/72 heart rate 50 respirations 18 Gen.-alert and oriented x3 in no apparent distress HEENT normal for age Lungs no rhonchi or wheezing Cardiovascular regular rhythm no murmurs carotid pulses 3+ palpable no bruits audible Abdomen soft nontender no palpable masses Musculoskeletal free of  major deformities Skin clear -no rashes Neurologic normal Lower extremities 3+ femoral and dorsalis pedis pulses palpable bilaterally with no edema Left upper extremity with excellent pulse and palpable thrill and  brachial cephalic AV fistula. 2+ radial pulse distally. Today I ordered venous duplex exam of the left brachiocephalic fistula. There are 2 or 3 branches the most significant of which arises about 5-6 cm from the anastomosis to the radial artery in the distal upper arm.      Assessment:     End-stage renal disease with a nicely functioning left upper arm AV fistula with 2 or 3 competing branches    Plan:     Ligation of competing branches left brachial cephalic AV fistula-we'll schedule this for Friday, April 25

## 2012-05-08 NOTE — Op Note (Signed)
OPERATIVE REPORT  Date of Surgery: 05/08/2012  Surgeon: Josephina Gip, MD  Assistant: Nurse  Pre-op Diagnosis: End Stage Renal Disease with functioning left brachial to cephalic AV fistula with 3 competing branches  Post-op Diagnosis: Same Procedure: Procedure(s): LIGATION OF COMPETING BRANCHES OF ARTERIOVENOUS FISTULA-left brachiocephalic x3  Anesthesia: MAC  EBL: Minimal  Complications: None  Procedure Details: Patient taken to the operating room placed in supine position at which time the left upper extremity was prepped with Betadine scrub and solution the drape in routine sterile manner. The left brachial-cephalic AV fistula was then visualized using the B-mode ultrasound-SonoSite. There were 3 significant competing branches one arising about 5 cm from the arterial anastomosis extending laterally which was the largest of the 3. The second was about 4-5 cm proximal to this area extending anteriorly and the third was about 5-10 cm proximally extending up laterally. After infiltration of 1% Xylocaine short incisions were made over these branches they were all easily identified ligated with 2-0 silk ties. There was excellent Doppler flow at the conclusion of the pre-procedure which had been improved through the fistula. Adequate hemostasis was achieved wound closed in layers with Vicryl in subcuticular fashion with Dermabond patient taken to recovery in stable condition    Josephina Gip, MD 05/08/2012 8:20 AM

## 2012-05-08 NOTE — Progress Notes (Signed)
Order rec'd fr. Dr. Jacklynn Bue, per Lyndee Leo for dose of insulin, novolog

## 2012-05-11 ENCOUNTER — Encounter (HOSPITAL_COMMUNITY): Payer: Self-pay | Admitting: Vascular Surgery

## 2012-05-13 ENCOUNTER — Ambulatory Visit (HOSPITAL_BASED_OUTPATIENT_CLINIC_OR_DEPARTMENT_OTHER): Payer: Medicare Other

## 2012-05-13 ENCOUNTER — Other Ambulatory Visit (HOSPITAL_BASED_OUTPATIENT_CLINIC_OR_DEPARTMENT_OTHER): Payer: Medicare Other | Admitting: Lab

## 2012-05-13 VITALS — HR 47 | Temp 99.1°F | Resp 20

## 2012-05-13 DIAGNOSIS — Z5112 Encounter for antineoplastic immunotherapy: Secondary | ICD-10-CM

## 2012-05-13 DIAGNOSIS — C9000 Multiple myeloma not having achieved remission: Secondary | ICD-10-CM

## 2012-05-13 DIAGNOSIS — Z7901 Long term (current) use of anticoagulants: Secondary | ICD-10-CM

## 2012-05-13 DIAGNOSIS — I82409 Acute embolism and thrombosis of unspecified deep veins of unspecified lower extremity: Secondary | ICD-10-CM

## 2012-05-13 LAB — CBC WITH DIFFERENTIAL/PLATELET
BASO%: 3.9 % — ABNORMAL HIGH (ref 0.0–2.0)
EOS%: 5.7 % (ref 0.0–7.0)
Eosinophils Absolute: 0.4 10*3/uL (ref 0.0–0.5)
LYMPH%: 16.8 % (ref 14.0–49.0)
MCH: 27.7 pg (ref 27.2–33.4)
MCHC: 30.8 g/dL — ABNORMAL LOW (ref 32.0–36.0)
MCV: 89.9 fL (ref 79.3–98.0)
MONO%: 9.3 % (ref 0.0–14.0)
NEUT#: 4.4 10*3/uL (ref 1.5–6.5)
RBC: 3.76 10*6/uL — ABNORMAL LOW (ref 4.20–5.82)
RDW: 17.6 % — ABNORMAL HIGH (ref 11.0–14.6)

## 2012-05-13 LAB — PROTIME-INR
INR: 1.2 — ABNORMAL LOW (ref 2.00–3.50)
Protime: 14.4 Seconds — ABNORMAL HIGH (ref 10.6–13.4)

## 2012-05-13 MED ORDER — ONDANSETRON HCL 8 MG PO TABS
8.0000 mg | ORAL_TABLET | Freq: Once | ORAL | Status: AC
  Administered 2012-05-13: 8 mg via ORAL

## 2012-05-13 MED ORDER — BORTEZOMIB CHEMO SQ INJECTION 3.5 MG (2.5MG/ML)
1.3000 mg/m2 | Freq: Once | INTRAMUSCULAR | Status: AC
  Administered 2012-05-13: 2.75 mg via SUBCUTANEOUS
  Filled 2012-05-13: qty 2.75

## 2012-05-13 NOTE — Patient Instructions (Addendum)

## 2012-05-18 ENCOUNTER — Other Ambulatory Visit: Payer: Self-pay | Admitting: Oncology

## 2012-05-18 DIAGNOSIS — N185 Chronic kidney disease, stage 5: Secondary | ICD-10-CM

## 2012-05-18 DIAGNOSIS — D472 Monoclonal gammopathy: Secondary | ICD-10-CM

## 2012-05-18 NOTE — Progress Notes (Signed)
I talked on the phone today with Dr. Gretel Acre at [facility].  Despite the 12% plasma cell on bone marrow biopsy, history of MGUS, sudden worsening of kidney function, Dr. Gretel Acre was not too convinced that patient has multiple myeloma since his M-spike and light chains were not too elevated.    He recommended kidney biopsy.  I discussed the case with patient's Nephrologist Dr. Arrie Aran who thought that his sudden worsening in renal function was due to myeloma but agreed with kidney biopsy.  I recommended IR to do kidney biopsy.  I discussed with Jack [name] today.  He agreed with the recommendation.  Plan for holding Coumadin for at least 5 days prior to kidney biopsy.

## 2012-05-20 ENCOUNTER — Other Ambulatory Visit: Payer: Self-pay | Admitting: Oncology

## 2012-05-20 ENCOUNTER — Encounter (HOSPITAL_COMMUNITY): Payer: Self-pay | Admitting: Pharmacy Technician

## 2012-05-20 ENCOUNTER — Encounter: Payer: Self-pay | Admitting: Oncology

## 2012-05-20 ENCOUNTER — Other Ambulatory Visit: Payer: Medicare Other | Admitting: Lab

## 2012-05-20 ENCOUNTER — Ambulatory Visit: Payer: Medicare Other

## 2012-05-20 ENCOUNTER — Other Ambulatory Visit: Payer: Self-pay | Admitting: Radiology

## 2012-05-20 ENCOUNTER — Other Ambulatory Visit (HOSPITAL_BASED_OUTPATIENT_CLINIC_OR_DEPARTMENT_OTHER): Payer: Medicare Other | Admitting: Lab

## 2012-05-20 DIAGNOSIS — C9000 Multiple myeloma not having achieved remission: Secondary | ICD-10-CM

## 2012-05-20 DIAGNOSIS — Z7901 Long term (current) use of anticoagulants: Secondary | ICD-10-CM

## 2012-05-20 DIAGNOSIS — I82409 Acute embolism and thrombosis of unspecified deep veins of unspecified lower extremity: Secondary | ICD-10-CM

## 2012-05-20 LAB — CBC WITH DIFFERENTIAL/PLATELET
EOS%: 6.9 % (ref 0.0–7.0)
LYMPH%: 16.9 % (ref 14.0–49.0)
MCH: 27.3 pg (ref 27.2–33.4)
MCHC: 30.3 g/dL — ABNORMAL LOW (ref 32.0–36.0)
MCV: 90.2 fL (ref 79.3–98.0)
MONO%: 14.6 % — ABNORMAL HIGH (ref 0.0–14.0)
Platelets: 143 10*3/uL (ref 140–400)
RBC: 3.66 10*6/uL — ABNORMAL LOW (ref 4.20–5.82)
RDW: 17.5 % — ABNORMAL HIGH (ref 11.0–14.6)
nRBC: 0 % (ref 0–0)

## 2012-05-20 LAB — PROTIME-INR: Protime: 16.8 Seconds — ABNORMAL HIGH (ref 10.6–13.4)

## 2012-05-20 NOTE — Progress Notes (Signed)
Per Dr. Gaylyn Rong, no treatment today.  Dr. Gaylyn Rong gave d/c instructions to pt and family in treatment area.

## 2012-05-25 ENCOUNTER — Other Ambulatory Visit: Payer: Medicare Other | Admitting: Lab

## 2012-05-25 ENCOUNTER — Ambulatory Visit: Payer: Medicare Other | Admitting: Oncology

## 2012-05-27 ENCOUNTER — Inpatient Hospital Stay (HOSPITAL_COMMUNITY): Admission: RE | Admit: 2012-05-27 | Payer: Medicare Other | Source: Ambulatory Visit

## 2012-05-27 ENCOUNTER — Other Ambulatory Visit (HOSPITAL_COMMUNITY): Payer: Medicare Other

## 2012-05-28 ENCOUNTER — Other Ambulatory Visit: Payer: Self-pay | Admitting: *Deleted

## 2012-05-28 ENCOUNTER — Ambulatory Visit (HOSPITAL_COMMUNITY)
Admission: RE | Admit: 2012-05-28 | Discharge: 2012-05-28 | Disposition: A | Discharge status: Home / Self Care | Payer: Medicare Other | Source: Ambulatory Visit | Attending: Oncology | Admitting: Oncology

## 2012-05-28 ENCOUNTER — Encounter (HOSPITAL_COMMUNITY): Payer: Self-pay

## 2012-05-28 DIAGNOSIS — N185 Chronic kidney disease, stage 5: Secondary | ICD-10-CM

## 2012-05-28 DIAGNOSIS — E0789 Other specified disorders of thyroid: Secondary | ICD-10-CM | POA: Insufficient documentation

## 2012-05-28 DIAGNOSIS — C9000 Multiple myeloma not having achieved remission: Secondary | ICD-10-CM | POA: Insufficient documentation

## 2012-05-28 DIAGNOSIS — N049 Nephrotic syndrome with unspecified morphologic changes: Secondary | ICD-10-CM | POA: Insufficient documentation

## 2012-05-28 DIAGNOSIS — E1129 Type 2 diabetes mellitus with other diabetic kidney complication: Secondary | ICD-10-CM | POA: Insufficient documentation

## 2012-05-28 DIAGNOSIS — N183 Chronic kidney disease, stage 3 unspecified: Secondary | ICD-10-CM | POA: Insufficient documentation

## 2012-05-28 DIAGNOSIS — Z79899 Other long term (current) drug therapy: Secondary | ICD-10-CM | POA: Insufficient documentation

## 2012-05-28 DIAGNOSIS — I129 Hypertensive chronic kidney disease with stage 1 through stage 4 chronic kidney disease, or unspecified chronic kidney disease: Secondary | ICD-10-CM | POA: Insufficient documentation

## 2012-05-28 DIAGNOSIS — D472 Monoclonal gammopathy: Secondary | ICD-10-CM

## 2012-05-28 LAB — CBC
HCT: 35.6 % — ABNORMAL LOW (ref 39.0–52.0)
MCH: 28.3 pg (ref 26.0–34.0)
MCV: 90.8 fL (ref 78.0–100.0)
Platelets: 146 10*3/uL — ABNORMAL LOW (ref 150–400)
RBC: 3.92 MIL/uL — ABNORMAL LOW (ref 4.22–5.81)
WBC: 4.6 10*3/uL (ref 4.0–10.5)

## 2012-05-28 LAB — BASIC METABOLIC PANEL
CO2: 32 mEq/L (ref 19–32)
Calcium: 9.3 mg/dL (ref 8.4–10.5)
Chloride: 96 mEq/L (ref 96–112)
Creatinine, Ser: 4.08 mg/dL — ABNORMAL HIGH (ref 0.50–1.35)
Glucose, Bld: 210 mg/dL — ABNORMAL HIGH (ref 70–99)
Sodium: 139 mEq/L (ref 135–145)

## 2012-05-28 LAB — APTT: aPTT: 35 seconds (ref 24–37)

## 2012-05-28 MED ORDER — SODIUM CHLORIDE 0.9 % IV SOLN
INTRAVENOUS | Status: DC
  Administered 2012-05-28: 20 mL/h via INTRAVENOUS

## 2012-05-28 MED ORDER — FENTANYL CITRATE 0.05 MG/ML IJ SOLN
INTRAMUSCULAR | Status: AC
  Filled 2012-05-28: qty 2

## 2012-05-28 MED ORDER — MIDAZOLAM HCL 2 MG/2ML IJ SOLN
INTRAMUSCULAR | Status: AC
  Filled 2012-05-28: qty 2

## 2012-05-28 MED ORDER — FENTANYL CITRATE 0.05 MG/ML IJ SOLN
INTRAMUSCULAR | Status: AC | PRN
  Administered 2012-05-28 (×2): 50 ug via INTRAVENOUS

## 2012-05-28 MED ORDER — MIDAZOLAM HCL 2 MG/2ML IJ SOLN
INTRAMUSCULAR | Status: AC | PRN
  Administered 2012-05-28: 2 mg via INTRAVENOUS

## 2012-05-28 NOTE — H&P (Signed)
Agree 

## 2012-05-28 NOTE — H&P (Signed)
Jack [name] is an 67 y.o. male.   Chief Complaint: "I'm here for a kidney biopsy" HPI: Patient with ESRD and MGUS presents today for US guided random renal biopsy to help differentiate renal failure from either myeloma or hypertensive nephrosis.  Past Medical History  Diagnosis Date  . Renal disorder   . Hypertension   . Diabetes mellitus without complication   . Shortness of breath   . Pneumonia     2012  . Heart murmur   . Glaucoma   . Multiple myeloma, without mention of having achieved remission(203.00) 03/30/2012    Cytogenetic neg on 03/23/2012.    Past Surgical History  Procedure Laterality Date  . Thyroidectomy    . Cervical disc surgery    . Eye surgery      CATARACTS  . Insertion of dialysis catheter Right 03/19/2012    Procedure: INSERTION OF DIALYSIS CATHETER;  Surgeon: Pryor Ochoa, MD;  Location: [facility] OR;  Service: Vascular;  Laterality: Right;  Right Internal Jugular  . Av fistula placement Left 03/25/2012    Procedure: ARTERIOVENOUS (AV) FISTULA CREATION;  Surgeon: Pryor Ochoa, MD;  Location: [facility] OR;  Service: Vascular;  Laterality: Left;  . Ligation of competing branches of arteriovenous fistula Left 05/08/2012    Procedure: LIGATION OF COMPETING BRANCHES OF ARTERIOVENOUS FISTULA;  Surgeon: Pryor Ochoa, MD;  Location: [facility] OR;  Service: Vascular;  Laterality: Left;  Ultrasound guided    No family history on file. Social History:  reports that he quit smoking about 30 years ago. He has never used smokeless tobacco. He reports that  drinks alcohol. He reports that he does not use illicit drugs.  Allergies:  Allergies  Allergen Reactions  . Ivp Dye (Iodinated Diagnostic Agents) Hives  . Morphine And Related Other (See Comments)    Bradycardia states patient    Current outpatient prescriptions:acyclovir (ZOVIRAX) 200 MG capsule, Take 200 mg by mouth 2 (two) times daily., Disp: , Rfl: ;  amLODipine (NORVASC) 10 MG tablet, Take 10 mg by mouth every morning.,  Disp: , Rfl: ;  aspirin EC 81 MG tablet, Take 81 mg by mouth daily., Disp: , Rfl: ;  calcitRIOL (ROCALTROL) 0.25 MCG capsule, Take 0.25 mcg by mouth daily., Disp: , Rfl:  calcium acetate (PHOSLO) 667 MG capsule, Take 1 capsule (667 mg total) by mouth 3 (three) times daily with meals., Disp: 9 capsule, Rfl: 0;  dexamethasone (DECADRON) 4 MG tablet, Take 20 mg by mouth once a week. Sunday, Disp: , Rfl: ;  doxazosin (CARDURA) 2 MG tablet, Take 2 mg by mouth at bedtime., Disp: , Rfl: ;  lenalidomide (REVLIMID) 5 MG capsule, Take 1 capsule (5 mg total) by mouth daily., Disp: 21 capsule, Rfl: 0 levothyroxine (SYNTHROID, LEVOTHROID) 175 MCG tablet, Take 175 mcg by mouth every morning. , Disp: , Rfl: ;  multivitamin (RENA-VIT) TABS tablet, Take 1 tablet by mouth at bedtime., Disp: 30 tablet, Rfl: 0;  nebivolol (BYSTOLIC) 10 MG tablet, Take 5 mg by mouth at bedtime. , Disp: , Rfl: ;  saxagliptin HCl (ONGLYZA) 5 MG TABS tablet, Take 5 mg by mouth daily., Disp: , Rfl:  warfarin (COUMADIN) 5 MG tablet, Take 2.5 mg by mouth daily., Disp: , Rfl:  Current facility-administered medications:0.9 %  sodium chloride infusion, , Intravenous, Continuous, D Kevin Allred, PA-C;  fentaNYL (SUBLIMAZE) 0.05 MG/ML injection, , , , ;  midazolam (VERSED) 2 MG/2ML injection, , , ,    Results for orders placed during the  hospital encounter of 05/28/12 (from the past 48 hour(s))  APTT     Status: None   Collection Time    05/28/12  8:00 AM      Result Value Range   aPTT 35  24 - 37 seconds  BASIC METABOLIC PANEL     Status: Abnormal   Collection Time    05/28/12  8:00 AM      Result Value Range   Sodium 139  135 - 145 mEq/L   Potassium 2.9 (*) 3.5 - 5.1 mEq/L   Chloride 96  96 - 112 mEq/L   CO2 32  19 - 32 mEq/L   Glucose, Bld 210 (*) 70 - 99 mg/dL   BUN 9  6 - 23 mg/dL   Creatinine, Ser 1.61 (*) 0.50 - 1.35 mg/dL   Calcium 9.3  8.4 - 09.6 mg/dL   GFR calc non Af Amer 14 (*) >90 mL/min   GFR calc Af Amer 16 (*) >90 mL/min    Comment:            The eGFR has been calculated     using the CKD EPI equation.     This calculation has not been     validated in all clinical     situations.     eGFR's persistently     <90 mL/min signify     possible Chronic Kidney Disease.  CBC     Status: Abnormal   Collection Time    05/28/12  8:00 AM      Result Value Range   WBC 4.6  4.0 - 10.5 K/uL   RBC 3.92 (*) 4.22 - 5.81 MIL/uL   Hemoglobin 11.1 (*) 13.0 - 17.0 g/dL   HCT 04.5 (*) 40.9 - 81.1 %   MCV 90.8  78.0 - 100.0 fL   MCH 28.3  26.0 - 34.0 pg   MCHC 31.2  30.0 - 36.0 g/dL   RDW 91.4 (*) 78.2 - 95.6 %   Platelets 146 (*) 150 - 400 K/uL  PROTIME-INR     Status: None   Collection Time    05/28/12  8:00 AM      Result Value Range   Prothrombin Time 13.1  11.6 - 15.2 seconds   INR 1.00  0.00 - 1.49   No results found.  Review of Systems  Constitutional: Negative for fever and chills.  Respiratory: Negative for cough and shortness of breath.   Cardiovascular: Negative for chest pain.  Gastrointestinal: Negative for nausea, vomiting and abdominal pain.  Musculoskeletal: Negative for back pain.  Neurological: Negative for headaches.  Endo/Heme/Allergies: Does not bruise/bleed easily.    Vitals: BP 149/68  HR 48  R 20  TEMP 98.7  O2 SATS 100% RA  Physical Exam  Constitutional: He is oriented to person, place, and time. He appears well-developed and well-nourished.  Cardiovascular: Normal rate and regular rhythm.   Patent left arm AVF  Respiratory: Effort normal and breath sounds normal.  Clean, intact rt chest wall HD cath  GI: Soft. Bowel sounds are normal. There is no tenderness.  Musculoskeletal: Normal range of motion. He exhibits no edema.  Neurological: He is alert and oriented to person, place, and time.     Assessment/Plan Pt with ESRD and MGUS/? Myeloma. Plan is for US guided random renal biopsy today to help distinguish renal failure from myeloma vs hypertensive nephrosis. Details/risks  of procedure d/w pt with his understanding and consent.  ALLRED,D KEVIN 05/28/2012, 9:17 AM

## 2012-05-28 NOTE — Procedures (Signed)
Procedure:  Ultrasound guided core biopsy of right kidney. Findings:  16 G core biopsy x 2 of lower pole of right kidney under ultrasound guidance.  No immediate complications.

## 2012-05-29 ENCOUNTER — Other Ambulatory Visit: Payer: Self-pay | Admitting: Oncology

## 2012-06-01 ENCOUNTER — Telehealth: Payer: Self-pay | Admitting: Oncology

## 2012-06-01 NOTE — Telephone Encounter (Signed)
Per 5/16 pof delay all visits (lb/fu/tx) by 7-10 days. tx is wkly - appts for 5/21 cx'd and f/u appts moved accordingly. S/w pt and informed him of change and gv him next appt for 5/28. Pt will get new schedule when he comes in 5/28.

## 2012-06-03 ENCOUNTER — Ambulatory Visit: Payer: Medicare Other | Admitting: Oncology

## 2012-06-03 ENCOUNTER — Other Ambulatory Visit: Payer: Medicare Other | Admitting: Lab

## 2012-06-03 ENCOUNTER — Ambulatory Visit: Payer: Medicare Other

## 2012-06-10 ENCOUNTER — Other Ambulatory Visit (HOSPITAL_BASED_OUTPATIENT_CLINIC_OR_DEPARTMENT_OTHER): Payer: Medicare Other | Admitting: Lab

## 2012-06-10 ENCOUNTER — Other Ambulatory Visit: Payer: Medicare Other | Admitting: Lab

## 2012-06-10 ENCOUNTER — Telehealth: Payer: Self-pay | Admitting: Oncology

## 2012-06-10 ENCOUNTER — Encounter: Payer: Self-pay | Admitting: Oncology

## 2012-06-10 ENCOUNTER — Ambulatory Visit (HOSPITAL_BASED_OUTPATIENT_CLINIC_OR_DEPARTMENT_OTHER): Payer: Medicare Other | Admitting: Oncology

## 2012-06-10 ENCOUNTER — Ambulatory Visit: Payer: Medicare Other

## 2012-06-10 VITALS — BP 185/76 | HR 68 | Temp 97.7°F | Resp 18 | Ht 70.0 in | Wt 190.7 lb

## 2012-06-10 DIAGNOSIS — I10 Essential (primary) hypertension: Secondary | ICD-10-CM

## 2012-06-10 DIAGNOSIS — I82409 Acute embolism and thrombosis of unspecified deep veins of unspecified lower extremity: Secondary | ICD-10-CM

## 2012-06-10 DIAGNOSIS — E119 Type 2 diabetes mellitus without complications: Secondary | ICD-10-CM

## 2012-06-10 DIAGNOSIS — C9000 Multiple myeloma not having achieved remission: Secondary | ICD-10-CM

## 2012-06-10 DIAGNOSIS — E039 Hypothyroidism, unspecified: Secondary | ICD-10-CM

## 2012-06-10 LAB — CBC WITH DIFFERENTIAL/PLATELET
BASO%: 3.3 % — ABNORMAL HIGH (ref 0.0–2.0)
HCT: 38.2 % — ABNORMAL LOW (ref 38.4–49.9)
LYMPH%: 26.6 % (ref 14.0–49.0)
MCH: 28.4 pg (ref 27.2–33.4)
MCHC: 31.6 g/dL — ABNORMAL LOW (ref 32.0–36.0)
MCV: 89.7 fL (ref 79.3–98.0)
MONO#: 0.6 10*3/uL (ref 0.1–0.9)
MONO%: 13.5 % (ref 0.0–14.0)
NEUT%: 51.6 % (ref 39.0–75.0)
Platelets: 191 10*3/uL (ref 140–400)
WBC: 4.6 10*3/uL (ref 4.0–10.3)

## 2012-06-10 LAB — PROTHROMBIN TIME: INR: 0.96 (ref <1.50)

## 2012-06-10 LAB — COMPREHENSIVE METABOLIC PANEL (CC13)
Alkaline Phosphatase: 76 U/L (ref 40–150)
BUN: 17 mg/dL (ref 7.0–26.0)
Glucose: 244 mg/dl — ABNORMAL HIGH (ref 70–99)
Sodium: 138 mEq/L (ref 136–145)
Total Bilirubin: 0.49 mg/dL (ref 0.20–1.20)
Total Protein: 7.5 g/dL (ref 6.4–8.3)

## 2012-06-10 NOTE — Telephone Encounter (Signed)
, °

## 2012-06-10 NOTE — Progress Notes (Signed)
[facility]  Telephone:([phone]) [phone] Fax:([phone]   OFFICE PROGRESS NOTE   Cc:  Geraldo Pitter, MD  DIAGNOSIS:  MGUS progression to IgG kappa multiple myeloma; presenting with anemia, ESRD.  Bone marrow biopsy on 03/23/2012 showed 12% plasma cell.  Cytogentics were normal.   CURRENT THERAPY:  Started chemo Revlimid/Velcade/Dexamethasone on 3/37/2014.    INTERVAL HISTORY: Jack [name] 67 y.o. male returns for regular follow up with his daughter.  He reported feeling stronger with PT.  He is able to walk short distances from room to room without any assistance.  He was able to walk outside of the house yesterday.  He is still getting dialysis without problem.  He no longer has SOB, DOE, pedal edema.  He denied fever, neuropathy, fatigue, infection from chemo. The patient was recently seen at [facility] for consideration of bone marrow transplant. The physician there was not convinced the patient had multiple myeloma given that his M spike and light chains were not elevated. The kidney biopsy was recommended.  Patient denies fever, anorexia, weight loss, headache, visual changes, confusion, drenching night sweats, palpable lymph node swelling, mucositis, odynophagia, dysphagia, nausea vomiting, jaundice, chest pain, palpitation, shortness of breath, dyspnea on exertion, productive cough, gum bleeding, epistaxis, hematemesis, hemoptysis, abdominal pain, abdominal swelling, early satiety, melena, hematochezia, hematuria, skin rash, spontaneous bleeding, joint swelling, joint pain, heat or cold intolerance, bowel bladder incontinence, back pain, focal motor weakness, paresthesia, depression.     Past Medical History  Diagnosis Date  . Renal disorder   . Hypertension   . Diabetes mellitus without complication   . Shortness of breath   . Pneumonia     2012  . Heart murmur   . Glaucoma   . Multiple myeloma, without mention of having achieved remission(203.00)  03/30/2012    Cytogenetic neg on 03/23/2012.    Past Surgical History  Procedure Laterality Date  . Thyroidectomy    . Cervical disc surgery    . Eye surgery      CATARACTS  . Insertion of dialysis catheter Right 03/19/2012    Procedure: INSERTION OF DIALYSIS CATHETER;  Surgeon: Pryor Ochoa, MD;  Location: [facility] OR;  Service: Vascular;  Laterality: Right;  Right Internal Jugular  . Av fistula placement Left 03/25/2012    Procedure: ARTERIOVENOUS (AV) FISTULA CREATION;  Surgeon: Pryor Ochoa, MD;  Location: [facility] OR;  Service: Vascular;  Laterality: Left;  . Ligation of competing branches of arteriovenous fistula Left 05/08/2012    Procedure: LIGATION OF COMPETING BRANCHES OF ARTERIOVENOUS FISTULA;  Surgeon: Pryor Ochoa, MD;  Location: [facility] OR;  Service: Vascular;  Laterality: Left;  Ultrasound guided    Current Outpatient Prescriptions  Medication Sig Dispense Refill  . acyclovir (ZOVIRAX) 200 MG capsule Take 200 mg by mouth 2 (two) times daily.      Marland Kitchen amLODipine (NORVASC) 10 MG tablet Take 10 mg by mouth every morning.      Marland Kitchen aspirin EC 81 MG tablet Take 81 mg by mouth daily.      . calcitRIOL (ROCALTROL) 0.25 MCG capsule Take 0.25 mcg by mouth daily.      . calcium acetate (PHOSLO) 667 MG capsule Take 1 capsule (667 mg total) by mouth 3 (three) times daily with meals.  9 capsule  0  . dexamethasone (DECADRON) 4 MG tablet Take 20 mg by mouth once a week. Sunday      . doxazosin (CARDURA) 2 MG tablet Take 2 mg by  mouth at bedtime.      Marland Kitchen lenalidomide (REVLIMID) 5 MG capsule Take 1 capsule (5 mg total) by mouth daily.  21 capsule  0  . levothyroxine (SYNTHROID, LEVOTHROID) 175 MCG tablet Take 175 mcg by mouth every morning.       . multivitamin (RENA-VIT) TABS tablet Take 1 tablet by mouth at bedtime.  30 tablet  0  . saxagliptin HCl (ONGLYZA) 5 MG TABS tablet Take 5 mg by mouth daily.      Marland Kitchen warfarin (COUMADIN) 5 MG tablet Take 2.5 mg by mouth daily.       No current facility-administered  medications for this visit.    ALLERGIES:  is allergic to ivp dye and morphine and related.   Filed Vitals:   06/10/12 1105  BP: 185/76  Pulse: 68  Temp: 97.7 F (36.5 C)  Resp: 18   Wt Readings from Last 3 Encounters:  06/10/12 190 lb 11.2 oz (86.501 kg)  05/28/12 185 lb (83.915 kg)  05/08/12 185 lb 3 oz (84 kg)   ECOG Performance status: 1-2  PHYSICAL EXAMINATION:   General:  well-nourished man, tired appearing, in no acute distress.  Eyes:  no scleral icterus.  ENT:  There were no oropharyngeal lesions.  Neck was without thyromegaly.  Lymphatics:  Negative cervical, supraclavicular or axillary adenopathy.  Respiratory: lungs were clear bilaterally without wheezing or crackles.  Cardiovascular:  Regular rate and rhythm, S1/S2, without murmur, rub or gallop.  There was 1+ bilateral pedal edema.  GI:  abdomen was soft, flat, nontender, nondistended, without organomegaly.  Muscoloskeletal:  no spinal tenderness of palpation of vertebral spine.  Skin exam was without echymosis, petichae.  Neuro exam was nonfocal.  Patient was alert and oriented.  Attention was good.   Language was appropriate.  Mood was normal without depression.  Speech was not pressured.  Thought content was not tangential.  He was able to get on exam table without assistance.     LABORATORY/RADIOLOGY DATA:  Lab Results  Component Value Date   WBC 4.6 06/10/2012   HGB 12.1* 06/10/2012   HCT 38.2* 06/10/2012   PLT 191 06/10/2012   GLUCOSE 244* 06/10/2012   ALKPHOS 76 06/10/2012   ALT 13 06/10/2012   AST 14 06/10/2012   NA 138 06/10/2012   K 3.6 06/10/2012   CL 97* 06/10/2012   CREATININE 6.1 Repeated and Verified* 06/10/2012   BUN 17.0 06/10/2012   CO2 31* 06/10/2012   PSA 9.32* 01/28/2011   INR 0.96 06/10/2012   HGBA1C 8.8* 03/19/2012   Renal biopsy results:  Diagnosis Kidney, biopsy w/ EM l, right lower pole KIDNEY, NEEDLE BIOPSY: DIABETIC GLOMERULOSCLEROSIS WITH FOCAL AND SEGMENTAL GLOMERULAR SCARRING. MODERATE  TO SEVERE ARTERIONEPHROSCLEROSIS   ASSESSMENT AND PLAN:   1.  Diagnosis:  Multiple myeloma.  - He is status post 2 cycles of VDR. He tolerated chemo well with response in decreasing M-spike and light chain.  He had no dose-limiting toxicity.  - He was referred to [facility] to be evaluated for autologous BMT. The physician who saw him was not convinced that the patient had multiple myeloma. A kidney biopsy was performed. The kidney biopsy did not show any evidence of multiple myeloma. The question is whether his sudden worsening in renal function was due to myeloma or due to his long-standing hypertension diabetes. I explained to the patient that it is possible that we don't see any evidence of myeloma given the fact he has already received 2 cycles of chemotherapy.  At this time, we will hold his chemotherapy pending an opinion from Dr. Marissa Calamity at [facility]. The patient understands this and is in agreement with this recommendation.  2.  Deconditioning:  Improved with PT.  He is able to ambulate more independently now.    3.  ESRD:  On dialysis on Tue/Thu/Sat.   4.  Diabetes mellitus; type II:  He is on saxagliptin per PCP. His glucose is still elevated.  I deferred to his PCP for further titration as appropriate.   5.  Hypertension:  He is now off bystolic. He states he was originally started on new medication, but does not have the name of this medicine.   6.  Hypothyroidism:  He is on Levothyroxine.  7. Followup: He is very scheduled back with [place] at the end of June and I have kept this appointment. However, if he has not yet seen Dr. Marissa Calamity by this time we will move his appointment out further.   Marland Kitchen

## 2012-06-12 LAB — PROTEIN ELECTROPHORESIS, SERUM
Alpha-2-Globulin: 9.3 % (ref 7.1–11.8)
Gamma Globulin: 19.2 % — ABNORMAL HIGH (ref 11.1–18.8)
M-Spike, %: 0.45 g/dL

## 2012-06-17 ENCOUNTER — Ambulatory Visit: Payer: Medicare Other

## 2012-06-17 ENCOUNTER — Other Ambulatory Visit: Payer: Medicare Other | Admitting: Lab

## 2012-06-23 ENCOUNTER — Ambulatory Visit: Payer: Medicare Other | Admitting: Vascular Surgery

## 2012-06-24 ENCOUNTER — Telehealth: Payer: Self-pay | Admitting: Oncology

## 2012-06-24 NOTE — Telephone Encounter (Signed)
Pt appt. With Dr. Marissa Calamity is 07/28/12@1 :30. Pt is aware by Dr. Marissa Calamity office.

## 2012-06-26 ENCOUNTER — Emergency Department (HOSPITAL_COMMUNITY)
Admission: EM | Admit: 2012-06-26 | Discharge: 2012-06-26 | Disposition: A | Discharge status: Home / Self Care | Payer: Medicare Other | Attending: Emergency Medicine | Admitting: Emergency Medicine

## 2012-06-26 ENCOUNTER — Encounter (HOSPITAL_COMMUNITY): Payer: Self-pay | Admitting: *Deleted

## 2012-06-26 DIAGNOSIS — Z8669 Personal history of other diseases of the nervous system and sense organs: Secondary | ICD-10-CM | POA: Insufficient documentation

## 2012-06-26 DIAGNOSIS — Z8701 Personal history of pneumonia (recurrent): Secondary | ICD-10-CM | POA: Insufficient documentation

## 2012-06-26 DIAGNOSIS — R319 Hematuria, unspecified: Secondary | ICD-10-CM

## 2012-06-26 DIAGNOSIS — I10 Essential (primary) hypertension: Secondary | ICD-10-CM | POA: Insufficient documentation

## 2012-06-26 DIAGNOSIS — Z87891 Personal history of nicotine dependence: Secondary | ICD-10-CM | POA: Insufficient documentation

## 2012-06-26 DIAGNOSIS — Z79899 Other long term (current) drug therapy: Secondary | ICD-10-CM | POA: Insufficient documentation

## 2012-06-26 DIAGNOSIS — Z7901 Long term (current) use of anticoagulants: Secondary | ICD-10-CM | POA: Insufficient documentation

## 2012-06-26 DIAGNOSIS — N289 Disorder of kidney and ureter, unspecified: Secondary | ICD-10-CM | POA: Insufficient documentation

## 2012-06-26 DIAGNOSIS — E119 Type 2 diabetes mellitus without complications: Secondary | ICD-10-CM | POA: Insufficient documentation

## 2012-06-26 DIAGNOSIS — Z7982 Long term (current) use of aspirin: Secondary | ICD-10-CM | POA: Insufficient documentation

## 2012-06-26 DIAGNOSIS — R011 Cardiac murmur, unspecified: Secondary | ICD-10-CM | POA: Insufficient documentation

## 2012-06-26 DIAGNOSIS — N19 Unspecified kidney failure: Secondary | ICD-10-CM | POA: Insufficient documentation

## 2012-06-26 LAB — URINE MICROSCOPIC-ADD ON

## 2012-06-26 LAB — URINALYSIS, ROUTINE W REFLEX MICROSCOPIC
Glucose, UA: 100 mg/dL — AB
Specific Gravity, Urine: 1.021 (ref 1.005–1.030)
Urobilinogen, UA: 0.2 mg/dL (ref 0.0–1.0)

## 2012-06-26 LAB — CBC WITH DIFFERENTIAL/PLATELET
Eosinophils Relative: 2 % (ref 0–5)
HCT: 41.7 % (ref 39.0–52.0)
Lymphocytes Relative: 34 % (ref 12–46)
Lymphs Abs: 1.4 10*3/uL (ref 0.7–4.0)
MCV: 90.3 fL (ref 78.0–100.0)
Monocytes Absolute: 0.5 10*3/uL (ref 0.1–1.0)
RBC: 4.62 MIL/uL (ref 4.22–5.81)
WBC: 4.2 10*3/uL (ref 4.0–10.5)

## 2012-06-26 LAB — BASIC METABOLIC PANEL
CO2: 29 mEq/L (ref 19–32)
Chloride: 95 mEq/L — ABNORMAL LOW (ref 96–112)
Sodium: 136 mEq/L (ref 135–145)

## 2012-06-26 MED ORDER — CEPHALEXIN 500 MG PO CAPS: 500.0000 mg | ORAL_CAPSULE | Freq: Two times a day (BID) | ORAL | Status: DC

## 2012-06-26 NOTE — ED Notes (Signed)
Reports going to dialysis yesterday and they accessed fistula left arm for first time, has large amount of bruising to left arm. Also reports having blood in urine that started yesterday and then this am was passing clots.

## 2012-06-26 NOTE — ED Provider Notes (Signed)
History     CSN: [id number]  Arrival date & time 06/26/12  4098   First MD Initiated Contact with Patient 06/26/12 0915      Chief Complaint  Patient presents with  . Hematuria     Patient is a 67 y.o. male presenting with hematuria. The history is provided by the patient.  Hematuria This is a new problem. The current episode started yesterday. The problem has not changed since onset.Pertinent negatives include no chest pain, no abdominal pain and no shortness of breath. Exacerbated by: urination. The symptoms are relieved by rest. He has tried rest for the symptoms. The treatment provided no relief.  pt reports he had full dialysis yesterday, no issues Soon after when he urinated he had blood in urine He reports there were some clots in his urine.  However he is able to fully evacuate urine No new back pain No abd pain No fever.  No vomiting No weakness He otherwise feels well He does take coumadin daily He also noted bruising around his left UE fistula but no active bleeding  Past Medical History  Diagnosis Date  . Renal disorder   . Hypertension   . Diabetes mellitus without complication   . Shortness of breath   . Pneumonia     2012  . Heart murmur   . Glaucoma   . Multiple myeloma, without mention of having achieved remission(203.00) 03/30/2012    Cytogenetic neg on 03/23/2012.    Past Surgical History  Procedure Laterality Date  . Thyroidectomy    . Cervical disc surgery    . Eye surgery      CATARACTS  . Insertion of dialysis catheter Right 03/19/2012    Procedure: INSERTION OF DIALYSIS CATHETER;  Surgeon: Pryor Ochoa, MD;  Location: [facility] OR;  Service: Vascular;  Laterality: Right;  Right Internal Jugular  . Av fistula placement Left 03/25/2012    Procedure: ARTERIOVENOUS (AV) FISTULA CREATION;  Surgeon: Pryor Ochoa, MD;  Location: [facility] OR;  Service: Vascular;  Laterality: Left;  . Ligation of competing branches of arteriovenous fistula Left 05/08/2012   Procedure: LIGATION OF COMPETING BRANCHES OF ARTERIOVENOUS FISTULA;  Surgeon: Pryor Ochoa, MD;  Location: [facility] OR;  Service: Vascular;  Laterality: Left;  Ultrasound guided    History reviewed. No pertinent family history.  History  Substance Use Topics  . Smoking status: Former Smoker    Quit date: 03/19/1982  . Smokeless tobacco: Never Used  . Alcohol Use: Yes     Comment: daily      Review of Systems  Constitutional: Negative for fever.  Respiratory: Negative for shortness of breath.   Cardiovascular: Negative for chest pain.  Gastrointestinal: Negative for vomiting and abdominal pain.  Genitourinary: Positive for hematuria. Negative for dysuria.  Neurological: Negative for weakness.  All other systems reviewed and are negative.    Allergies  Ivp dye and Morphine and related  Home Medications   Current Outpatient Rx  Name  Route  Sig  Dispense  Refill  . acyclovir (ZOVIRAX) 200 MG capsule   Oral   Take 200 mg by mouth 2 (two) times daily.         Marland Kitchen amLODipine (NORVASC) 10 MG tablet   Oral   Take 10 mg by mouth every morning.         Marland Kitchen aspirin EC 81 MG tablet   Oral   Take 81 mg by mouth daily.         . calcitRIOL (  ROCALTROL) 0.25 MCG capsule   Oral   Take 0.25 mcg by mouth daily.         . calcium acetate (PHOSLO) 667 MG capsule   Oral   Take 1 capsule (667 mg total) by mouth 3 (three) times daily with meals.   9 capsule   0   . doxazosin (CARDURA) 2 MG tablet   Oral   Take 2 mg by mouth at bedtime.         Marland Kitchen levothyroxine (SYNTHROID, LEVOTHROID) 175 MCG tablet   Oral   Take 175 mcg by mouth every morning.          . multivitamin (RENA-VIT) TABS tablet   Oral   Take 1 tablet by mouth at bedtime.   30 tablet   0   . saxagliptin HCl (ONGLYZA) 5 MG TABS tablet   Oral   Take 5 mg by mouth daily.         Marland Kitchen warfarin (COUMADIN) 5 MG tablet   Oral   Take 5 mg by mouth daily at 12 noon.           BP 160/80  Pulse 83   Temp(Src) 97.6 F (36.4 C) (Oral)  Resp 20  SpO2 99%  Physical Exam CONSTITUTIONAL: Well developed/well nourished HEAD: Normocephalic/atraumatic EYES: EOMI/PERRL ENMT: Mucous membranes moist NECK: supple no meningeal signs CV: S1/S2 noted, no murmurs/rubs/gallops noted Chest - dialysis access to right chest, no tenderness/erythema LUNGS: Lungs are clear to auscultation bilaterally, no apparent distress ABDOMEN: soft, nontender, no rebound or guarding GU:no cva tenderness NEURO: Pt is awake/alert, moves all extremitiesx4 EXTREMITIES: pulses normal, full ROM Fistula noted to left UE.  Thrill noted.  No active bleeding.  There is localized bruising noted.   SKIN: warm, color normal PSYCH: no abnormalities of mood noted  ED Course  Procedures (including critical care time)  Labs Reviewed  BASIC METABOLIC PANEL  CBC WITH DIFFERENTIAL  PROTIME-INR  URINALYSIS, ROUTINE W REFLEX MICROSCOPIC   9:46 AM Pt reports he makes urine 2-3 times daily He has never had hematuria before No recent trauma/falls Denies known prostate issues   Pt stable No distress Will start keflex BID for possible infectious etiology Advised to hold one dose of coumadin today and go back to regular Advised need for f/u with urology Also, discussed strict return precautions Stable for d/c  MDM  Nursing notes including past medical history and social history reviewed and considered in documentation Labs/vital reviewed and considered         Joya Gaskins, MD 06/26/12 1130

## 2012-06-29 ENCOUNTER — Telehealth: Payer: Self-pay | Admitting: *Deleted

## 2012-06-29 ENCOUNTER — Encounter: Payer: Self-pay | Admitting: Vascular Surgery

## 2012-06-29 NOTE — Telephone Encounter (Signed)
Pt called, states hasn't heard anything about referral to [facility] yet?  Informed him that his appt is documented by our Scheduler as being on 07/28/12 at 1;30 pm with Dr. Marissa Calamity.  Gave him the phone number to their office to confirm.  Explained they should be contacting him and sending out a new pt packet.  He verbalized understanding and will call them if he doesn't hear anything by 7/01.

## 2012-06-30 ENCOUNTER — Telehealth: Payer: Self-pay | Admitting: *Deleted

## 2012-06-30 ENCOUNTER — Ambulatory Visit: Payer: Medicare Other | Admitting: Vascular Surgery

## 2012-06-30 ENCOUNTER — Ambulatory Visit (INDEPENDENT_AMBULATORY_CARE_PROVIDER_SITE_OTHER): Payer: Medicare Other | Admitting: Vascular Surgery

## 2012-06-30 ENCOUNTER — Telehealth: Payer: Self-pay | Admitting: Oncology

## 2012-06-30 ENCOUNTER — Encounter: Payer: Self-pay | Admitting: Vascular Surgery

## 2012-06-30 ENCOUNTER — Other Ambulatory Visit: Payer: Self-pay | Admitting: Oncology

## 2012-06-30 VITALS — BP 196/103 | HR 86 | Resp 18 | Ht 70.0 in | Wt 199.0 lb

## 2012-06-30 DIAGNOSIS — D472 Monoclonal gammopathy: Secondary | ICD-10-CM

## 2012-06-30 DIAGNOSIS — N186 End stage renal disease: Secondary | ICD-10-CM

## 2012-06-30 NOTE — Telephone Encounter (Signed)
s.w. pt wife and advised on 7.16.14 appt....and all other cancelled...ok and aware

## 2012-06-30 NOTE — Progress Notes (Signed)
Subjective:     Patient ID: Jack [name], male   DOB: 1945/09/02, 67 y.o.   MRN: [id number]  HPI this 67 year old male is 6 weeks status post ligation of competing branches of left brachial cephalic AV fistula which was created in March of 2014. The fascia was used last week. There was some bleeding. The patient is on Coumadin. He denies any pain in the left hand does have some occasional tingling. Apparently the fistula worked well even then there was some bleeding at the time of the access.  Review of Systems     Objective:   Physical Exam BP 196/103  Pulse 86  Resp 18  Ht 5\' 10"  (1.778 m)  Wt 199 lb (90.266 kg)  BMI 28.55 kg/m2  General well-developed well-nourished male in no apparent distress alert and oriented x3 Left upper extremity with ecchymosis medially with excellent pulse and thrill in brachiocephalic AV fistula. 2+ radial pulse palpable distally with no evidence of ischemia      Assessment:     Status post ligation competing branches left brachial cephalic AV fistula-functioning nicely There was some ecchymosis and bleeding when fistula was utilized most recently    Plan:     Would recommend letting the fistula "rest" for 2 weeks and then resuming use. We'll see patient back on when necessary basis

## 2012-06-30 NOTE — Telephone Encounter (Signed)
Called pt's cell phone and his daughter, Toni Amend, answered.  Informed her of Dr. Lodema Pilot message below and asked her to have pt call back if any questions.  She verbalized understanding.

## 2012-06-30 NOTE — Telephone Encounter (Signed)
Message copied by Wende Mott on Tue Jun 30, 2012 11:36 AM ------      Message from: Jethro Bolus T      Created: Tue Jun 30, 2012  9:44 AM      Regarding: RE: Schedule Clarification       Please let pt know that I'll place a POF to cancel all pending appointment including that on 6/25.  I'll see him after 07/28/12.  Thanks.                  ----- Message -----         From: Marlowe Aschoff, RN         Sent: 06/30/2012   9:39 AM           To: Exie Parody, MD      Subject: Schedule Clarification                                   He is going to see Dr. Marissa Calamity at [facility] on 07/28/12.  Does he need to keep his appts scheduled here for lab/OV/chemo on 6/25?  Looks like his chemo on hold til he goes to [facility]?  Also lab/chemo scheduled weekly through end of July.  Can we cancel any of these?       thanks       ------

## 2012-07-01 ENCOUNTER — Ambulatory Visit: Payer: Medicare Other | Admitting: Oncology

## 2012-07-01 ENCOUNTER — Other Ambulatory Visit: Payer: Medicare Other | Admitting: Lab

## 2012-07-01 ENCOUNTER — Ambulatory Visit: Payer: Medicare Other

## 2012-07-01 NOTE — Progress Notes (Signed)
This encounter was created in error - please disregard.

## 2012-07-08 ENCOUNTER — Ambulatory Visit: Payer: Medicare Other

## 2012-07-08 ENCOUNTER — Other Ambulatory Visit: Payer: Medicare Other | Admitting: Lab

## 2012-07-08 ENCOUNTER — Ambulatory Visit: Payer: Medicare Other | Admitting: Oncology

## 2012-07-15 ENCOUNTER — Ambulatory Visit: Payer: Medicare Other

## 2012-07-15 ENCOUNTER — Other Ambulatory Visit: Payer: Medicare Other | Admitting: Lab

## 2012-07-23 ENCOUNTER — Inpatient Hospital Stay (HOSPITAL_COMMUNITY)
Admission: EM | Admit: 2012-07-23 | Discharge: 2012-07-27 | DRG: 314 | Disposition: A | Discharge status: Home / Self Care | Payer: Medicare Other | Attending: Family Medicine | Admitting: Family Medicine

## 2012-07-23 ENCOUNTER — Encounter (HOSPITAL_COMMUNITY): Payer: Self-pay | Admitting: *Deleted

## 2012-07-23 DIAGNOSIS — H409 Unspecified glaucoma: Secondary | ICD-10-CM | POA: Diagnosis present

## 2012-07-23 DIAGNOSIS — D631 Anemia in chronic kidney disease: Secondary | ICD-10-CM | POA: Diagnosis present

## 2012-07-23 DIAGNOSIS — Y841 Kidney dialysis as the cause of abnormal reaction of the patient, or of later complication, without mention of misadventure at the time of the procedure: Secondary | ICD-10-CM | POA: Diagnosis present

## 2012-07-23 DIAGNOSIS — A4189 Other specified sepsis: Secondary | ICD-10-CM | POA: Diagnosis present

## 2012-07-23 DIAGNOSIS — T80218A Other infection due to central venous catheter, initial encounter: Principal | ICD-10-CM | POA: Diagnosis present

## 2012-07-23 DIAGNOSIS — D472 Monoclonal gammopathy: Secondary | ICD-10-CM | POA: Diagnosis present

## 2012-07-23 DIAGNOSIS — Z79899 Other long term (current) drug therapy: Secondary | ICD-10-CM

## 2012-07-23 DIAGNOSIS — E869 Volume depletion, unspecified: Secondary | ICD-10-CM | POA: Diagnosis present

## 2012-07-23 DIAGNOSIS — I440 Atrioventricular block, first degree: Secondary | ICD-10-CM | POA: Diagnosis present

## 2012-07-23 DIAGNOSIS — Z794 Long term (current) use of insulin: Secondary | ICD-10-CM

## 2012-07-23 DIAGNOSIS — E890 Postprocedural hypothyroidism: Secondary | ICD-10-CM | POA: Diagnosis present

## 2012-07-23 DIAGNOSIS — E039 Hypothyroidism, unspecified: Secondary | ICD-10-CM

## 2012-07-23 DIAGNOSIS — Z87891 Personal history of nicotine dependence: Secondary | ICD-10-CM

## 2012-07-23 DIAGNOSIS — A419 Sepsis, unspecified organism: Secondary | ICD-10-CM | POA: Diagnosis present

## 2012-07-23 DIAGNOSIS — Z992 Dependence on renal dialysis: Secondary | ICD-10-CM

## 2012-07-23 DIAGNOSIS — N039 Chronic nephritic syndrome with unspecified morphologic changes: Secondary | ICD-10-CM | POA: Diagnosis present

## 2012-07-23 DIAGNOSIS — R112 Nausea with vomiting, unspecified: Secondary | ICD-10-CM

## 2012-07-23 DIAGNOSIS — R531 Weakness: Secondary | ICD-10-CM

## 2012-07-23 DIAGNOSIS — E108 Type 1 diabetes mellitus with unspecified complications: Secondary | ICD-10-CM | POA: Diagnosis present

## 2012-07-23 DIAGNOSIS — Z833 Family history of diabetes mellitus: Secondary | ICD-10-CM

## 2012-07-23 DIAGNOSIS — N186 End stage renal disease: Secondary | ICD-10-CM | POA: Diagnosis present

## 2012-07-23 DIAGNOSIS — I120 Hypertensive chronic kidney disease with stage 5 chronic kidney disease or end stage renal disease: Secondary | ICD-10-CM | POA: Diagnosis present

## 2012-07-23 DIAGNOSIS — Z841 Family history of disorders of kidney and ureter: Secondary | ICD-10-CM

## 2012-07-23 DIAGNOSIS — Z9221 Personal history of antineoplastic chemotherapy: Secondary | ICD-10-CM

## 2012-07-23 DIAGNOSIS — Z91041 Radiographic dye allergy status: Secondary | ICD-10-CM

## 2012-07-23 DIAGNOSIS — N2581 Secondary hyperparathyroidism of renal origin: Secondary | ICD-10-CM | POA: Diagnosis present

## 2012-07-23 DIAGNOSIS — Z7982 Long term (current) use of aspirin: Secondary | ICD-10-CM

## 2012-07-23 DIAGNOSIS — J45909 Unspecified asthma, uncomplicated: Secondary | ICD-10-CM | POA: Diagnosis present

## 2012-07-23 DIAGNOSIS — C9000 Multiple myeloma not having achieved remission: Secondary | ICD-10-CM | POA: Diagnosis present

## 2012-07-23 HISTORY — DX: Unspecified asthma, uncomplicated: J45.909

## 2012-07-23 LAB — CBC WITH DIFFERENTIAL/PLATELET
Basophils Absolute: 0 10*3/uL (ref 0.0–0.1)
Eosinophils Relative: 1 % (ref 0–5)
Lymphocytes Relative: 4 % — ABNORMAL LOW (ref 12–46)
Lymphs Abs: 0.4 10*3/uL — ABNORMAL LOW (ref 0.7–4.0)
Neutro Abs: 9 10*3/uL — ABNORMAL HIGH (ref 1.7–7.7)
Neutrophils Relative %: 88 % — ABNORMAL HIGH (ref 43–77)
Platelets: 134 10*3/uL — ABNORMAL LOW (ref 150–400)
RBC: 3.98 MIL/uL — ABNORMAL LOW (ref 4.22–5.81)
RDW: 14.7 % (ref 11.5–15.5)
WBC: 10.2 10*3/uL (ref 4.0–10.5)

## 2012-07-23 LAB — POCT I-STAT, CHEM 8
BUN: 13 mg/dL (ref 6–23)
Creatinine, Ser: 5 mg/dL — ABNORMAL HIGH (ref 0.50–1.35)
Potassium: 3.3 mEq/L — ABNORMAL LOW (ref 3.5–5.1)
Sodium: 138 mEq/L (ref 135–145)
TCO2: 32 mmol/L (ref 0–100)

## 2012-07-23 LAB — POCT I-STAT TROPONIN I: Troponin i, poc: 0.01 ng/mL (ref 0.00–0.08)

## 2012-07-23 NOTE — ED Notes (Signed)
Patient asked for and received a cup of ice water.  

## 2012-07-23 NOTE — ED Notes (Signed)
Patient states he is unable to sit up or stand on his own to complete the orthostatic vitals. He would slowly drift backwards in a reclining position each time he tried to sit up.

## 2012-07-23 NOTE — ED Notes (Addendum)
Per [facility] pt c/o n/v/generalized weakness since dialysis this afternoon.Pt NSR en route. 4mg  Zofran given en route. Emesis X 3. Denies pain. Denies sob. On assessment pt states app 6pm this he started feeling very weak. He "couldn't get up to walk with walker". States nausea is gone after Zofran given en route. Pt hesitating answering question. States this not normal for him.

## 2012-07-23 NOTE — ED Notes (Signed)
Pt states after dialysis at app 6pm he "felt weak" and was unable to ambulate. Pt denies any weakness at this time. States "it passed on the way here". Emesis X 3 prior to transport. States nausea improved after receiving Zofran en route.

## 2012-07-23 NOTE — ED Provider Notes (Signed)
History    CSN: [id number] Arrival date & time 07/23/12  2149  First MD Initiated Contact with Patient 07/23/12 2249     Chief Complaint  Patient presents with  . Weakness    (Consider location/radiation/quality/duration/timing/severity/associated sxs/prior Treatment) HPI This is a 68 year old male on hemodialysis for end-stage renal disease. He went to dialysis this morning about 11 but was delayed until 1:30 PM. His dialysis was completed about 5:30 PM. He went home on attempted deep dinner about 6:30 became very weak. He he was too weak to get out of bed or to ambulate on his own. He became nauseated and vomited 3 times. He did not have any diarrhea, abdominal pain, chest pain, shortness of breath, palpitations, fever chills. He was given Zofran by EMS prior to arrival with relief of his nausea. He has not vomited again. The symptoms are moderate in severity. There is no focal neurologic complaint.  Past Medical History  Diagnosis Date  . Hypertension   . Diabetes mellitus without complication   . Shortness of breath   . Pneumonia     2012  . Heart murmur   . Glaucoma   . Multiple myeloma, without mention of having achieved remission(203.00) 03/30/2012    Cytogenetic neg on 03/23/2012.  . End stage renal disease on dialysis    Past Surgical History  Procedure Laterality Date  . Thyroidectomy    . Cervical disc surgery    . Eye surgery      CATARACTS  . Insertion of dialysis catheter Right 03/19/2012    Procedure: INSERTION OF DIALYSIS CATHETER;  Surgeon: Pryor Ochoa, MD;  Location: [facility] OR;  Service: Vascular;  Laterality: Right;  Right Internal Jugular  . Av fistula placement Left 03/25/2012    Procedure: ARTERIOVENOUS (AV) FISTULA CREATION;  Surgeon: Pryor Ochoa, MD;  Location: [facility] OR;  Service: Vascular;  Laterality: Left;  . Ligation of competing branches of arteriovenous fistula Left 05/08/2012    Procedure: LIGATION OF COMPETING BRANCHES OF ARTERIOVENOUS FISTULA;   Surgeon: Pryor Ochoa, MD;  Location: [facility] OR;  Service: Vascular;  Laterality: Left;  Ultrasound guided   Family History  Problem Relation Age of Onset  . Diabetes Mother   . Cancer Mother     bone   . Kidney disease Father    History  Substance Use Topics  . Smoking status: Former Smoker    Types: Cigarettes    Quit date: 03/19/1982  . Smokeless tobacco: Never Used  . Alcohol Use: Yes     Comment: daily    Review of Systems  All other systems reviewed and are negative.    Allergies  Ivp dye and Morphine and related  Home Medications   Current Outpatient Rx  Name  Route  Sig  Dispense  Refill  . amLODipine (NORVASC) 10 MG tablet   Oral   Take 10 mg by mouth every morning.         Marland Kitchen aspirin EC 81 MG tablet   Oral   Take 81 mg by mouth daily.         . calcitRIOL (ROCALTROL) 0.25 MCG capsule   Oral   Take 0.25 mcg by mouth daily.         . calcium acetate (PHOSLO) 667 MG capsule   Oral   Take 1 capsule (667 mg total) by mouth 3 (three) times daily with meals.   9 capsule   0   . doxazosin (CARDURA) 2 MG tablet  Oral   Take 2 mg by mouth at bedtime.         Marland Kitchen levothyroxine (SYNTHROID, LEVOTHROID) 175 MCG tablet   Oral   Take 175 mcg by mouth every morning.          . multivitamin (RENA-VIT) TABS tablet   Oral   Take 1 tablet by mouth at bedtime.   30 tablet   0   . saxagliptin HCl (ONGLYZA) 5 MG TABS tablet   Oral   Take 5 mg by mouth daily.          BP 139/63  Pulse 95  Temp(Src) 98 F (36.7 C) (Oral)  Resp 16  SpO2 96%  Physical Exam General: Well-developed, well-nourished male in no acute distress; appearance consistent with age of record HENT: normocephalic, atraumatic Eyes: pupils equal round and reactive to light; extraocular muscles intact; lens implants Neck: supple Heart: Irregular rhythm; frequent ectopy Lungs: clear to auscultation bilaterally Chest: Vas-Cath and right upper chest Abdomen: soft; nondistended;  nontender; bowel sounds present Extremities: No deformity; full range of motion; pulses normal Neurologic: Awake, alert and oriented x 4; motor function intact in all extremities and symmetric; no facial droop Skin: Warm and dry Psychiatric: Normal mood and affect    ED Course  Procedures (including critical care time)    MDM   Nursing notes and vitals signs, including pulse oximetry, reviewed.  Summary of this visit's results, reviewed by myself:  Labs:  Results for orders placed during the hospital encounter of 07/23/12 (from the past 24 hour(s))  CBC WITH DIFFERENTIAL     Status: Abnormal   Collection Time    07/23/12 11:13 PM      Result Value Range   WBC 10.2  4.0 - 10.5 K/uL   RBC 3.98 (*) 4.22 - 5.81 MIL/uL   Hemoglobin 11.4 (*) 13.0 - 17.0 g/dL   HCT 16.1 (*) 09.6 - 04.5 %   MCV 87.2  78.0 - 100.0 fL   MCH 28.6  26.0 - 34.0 pg   MCHC 32.9  30.0 - 36.0 g/dL   RDW 40.9  81.1 - 91.4 %   Platelets 134 (*) 150 - 400 K/uL   Neutrophils Relative % 88 (*) 43 - 77 %   Neutro Abs 9.0 (*) 1.7 - 7.7 K/uL   Lymphocytes Relative 4 (*) 12 - 46 %   Lymphs Abs 0.4 (*) 0.7 - 4.0 K/uL   Monocytes Relative 7  3 - 12 %   Monocytes Absolute 0.7  0.1 - 1.0 K/uL   Eosinophils Relative 1  0 - 5 %   Eosinophils Absolute 0.1  0.0 - 0.7 K/uL   Basophils Relative 0  0 - 1 %   Basophils Absolute 0.0  0.0 - 0.1 K/uL  POCT I-STAT TROPONIN I     Status: None   Collection Time    07/23/12 11:26 PM      Result Value Range   Troponin i, poc 0.01  0.00 - 0.08 ng/mL   Comment 3           POCT I-STAT, CHEM 8     Status: Abnormal   Collection Time    07/23/12 11:28 PM      Result Value Range   Sodium 138  135 - 145 mEq/L   Potassium 3.3 (*) 3.5 - 5.1 mEq/L   Chloride 96  96 - 112 mEq/L   BUN 13  6 - 23 mg/dL   Creatinine, Ser 7.82 (*)  0.50 - 1.35 mg/dL   Glucose, Bld 454 (*) 70 - 99 mg/dL   Calcium, Ion 0.98 (*) 1.13 - 1.30 mmol/L   TCO2 32  0 - 100 mmol/L   Hemoglobin 12.2 (*) 13.0 -  17.0 g/dL   HCT 11.9 (*) 14.7 - 82.9 %    EKG Interpretation:  Date & Time: 07/23/2012 10:09 PM  Rate: 95  Rhythm: Sinus rhythm with frequent premature atrial contractions  QRS Axis: normal  Intervals: PR prolonged  ST/T Wave abnormalities: normal  Conduction Disutrbances:first-degree A-V block   Narrative Interpretation:   Old EKG Reviewed: Previously sinus bradycardia with first-degree AV block  12:46 AM Patient to week and lightheaded to cooperate with orthostatic vital signs.      Hanley Seamen, MD 07/24/12 [phone]

## 2012-07-24 ENCOUNTER — Observation Stay (HOSPITAL_COMMUNITY): Payer: Medicare Other

## 2012-07-24 ENCOUNTER — Encounter (HOSPITAL_COMMUNITY): Payer: Self-pay | Admitting: Internal Medicine

## 2012-07-24 DIAGNOSIS — N186 End stage renal disease: Secondary | ICD-10-CM

## 2012-07-24 DIAGNOSIS — E108 Type 1 diabetes mellitus with unspecified complications: Secondary | ICD-10-CM

## 2012-07-24 DIAGNOSIS — E039 Hypothyroidism, unspecified: Secondary | ICD-10-CM

## 2012-07-24 DIAGNOSIS — R5381 Other malaise: Secondary | ICD-10-CM

## 2012-07-24 DIAGNOSIS — D472 Monoclonal gammopathy: Secondary | ICD-10-CM

## 2012-07-24 DIAGNOSIS — E869 Volume depletion, unspecified: Secondary | ICD-10-CM

## 2012-07-24 DIAGNOSIS — R112 Nausea with vomiting, unspecified: Secondary | ICD-10-CM

## 2012-07-24 DIAGNOSIS — R531 Weakness: Secondary | ICD-10-CM | POA: Diagnosis present

## 2012-07-24 LAB — CBC
HCT: 35.4 % — ABNORMAL LOW (ref 39.0–52.0)
Hemoglobin: 11 g/dL — ABNORMAL LOW (ref 13.0–17.0)
RBC: 3.98 MIL/uL — ABNORMAL LOW (ref 4.22–5.81)
WBC: 8.2 10*3/uL (ref 4.0–10.5)

## 2012-07-24 LAB — CREATININE, SERUM
Creatinine, Ser: 6.13 mg/dL — ABNORMAL HIGH (ref 0.50–1.35)
GFR calc Af Amer: 10 mL/min — ABNORMAL LOW (ref >90)
GFR calc non Af Amer: 8 mL/min — ABNORMAL LOW (ref >90)

## 2012-07-24 LAB — GLUCOSE, CAPILLARY
Glucose-Capillary: 142 mg/dL — ABNORMAL HIGH (ref 70–99)
Glucose-Capillary: 139 mg/dL — ABNORMAL HIGH (ref 70–99)

## 2012-07-24 LAB — TSH: TSH: 0.264 u[IU]/mL — ABNORMAL LOW (ref 0.350–4.500)

## 2012-07-24 MED ORDER — DOXAZOSIN MESYLATE 2 MG PO TABS
2.0000 mg | ORAL_TABLET | Freq: Every day | ORAL | Status: DC
  Administered 2012-07-24 – 2012-07-26 (×3): 2 mg via ORAL
  Filled 2012-07-24 (×4): qty 1

## 2012-07-24 MED ORDER — LINAGLIPTIN 5 MG PO TABS
5.0000 mg | ORAL_TABLET | Freq: Every day | ORAL | Status: DC
  Administered 2012-07-24: 5 mg via ORAL
  Filled 2012-07-24: qty 1

## 2012-07-24 MED ORDER — INSULIN ASPART 100 UNIT/ML ~~LOC~~ SOLN: 0.0000 [IU] | Freq: Every day | SUBCUTANEOUS | Status: DC

## 2012-07-24 MED ORDER — VANCOMYCIN HCL IN DEXTROSE 750-5 MG/150ML-% IV SOLN
750.0000 mg | INTRAVENOUS | Status: DC
  Administered 2012-07-25: 750 mg via INTRAVENOUS
  Filled 2012-07-24 (×2): qty 150

## 2012-07-24 MED ORDER — SODIUM CHLORIDE 0.9 % IJ SOLN
3.0000 mL | Freq: Two times a day (BID) | INTRAMUSCULAR | Status: DC
  Administered 2012-07-24 – 2012-07-27 (×8): 3 mL via INTRAVENOUS

## 2012-07-24 MED ORDER — SODIUM CHLORIDE 0.9 % IV BOLUS (SEPSIS)
250.0000 mL | Freq: Once | INTRAVENOUS | Status: AC
  Administered 2012-07-24: 250 mL via INTRAVENOUS

## 2012-07-24 MED ORDER — ASPIRIN EC 81 MG PO TBEC
81.0000 mg | DELAYED_RELEASE_TABLET | Freq: Every day | ORAL | Status: DC
  Administered 2012-07-24 – 2012-07-27 (×4): 81 mg via ORAL
  Filled 2012-07-24 (×4): qty 1

## 2012-07-24 MED ORDER — ONDANSETRON HCL 4 MG/2ML IJ SOLN: 4.0000 mg | Freq: Four times a day (QID) | INTRAMUSCULAR | Status: DC | PRN

## 2012-07-24 MED ORDER — DOCUSATE SODIUM 100 MG PO CAPS
100.0000 mg | ORAL_CAPSULE | Freq: Two times a day (BID) | ORAL | Status: DC
  Administered 2012-07-24 – 2012-07-25 (×3): 100 mg via ORAL
  Filled 2012-07-24 (×8): qty 1

## 2012-07-24 MED ORDER — DEXTROSE-NACL 5-0.9 % IV SOLN
INTRAVENOUS | Status: DC
  Administered 2012-07-24 – 2012-07-25 (×2): via INTRAVENOUS

## 2012-07-24 MED ORDER — AMLODIPINE BESYLATE 10 MG PO TABS
10.0000 mg | ORAL_TABLET | Freq: Every morning | ORAL | Status: DC
  Administered 2012-07-24: 10 mg via ORAL
  Filled 2012-07-24: qty 1

## 2012-07-24 MED ORDER — LEVOTHYROXINE SODIUM 175 MCG PO TABS
175.0000 ug | ORAL_TABLET | Freq: Every day | ORAL | Status: DC
  Administered 2012-07-24 – 2012-07-27 (×4): 175 ug via ORAL
  Filled 2012-07-24 (×6): qty 1

## 2012-07-24 MED ORDER — HEPARIN SODIUM (PORCINE) 5000 UNIT/ML IJ SOLN
5000.0000 [IU] | Freq: Three times a day (TID) | INTRAMUSCULAR | Status: DC
  Administered 2012-07-24 – 2012-07-27 (×9): 5000 [IU] via SUBCUTANEOUS
  Filled 2012-07-24 (×13): qty 1

## 2012-07-24 MED ORDER — VANCOMYCIN HCL 10 G IV SOLR
1500.0000 mg | Freq: Once | INTRAVENOUS | Status: AC
  Administered 2012-07-24: 1500 mg via INTRAVENOUS
  Filled 2012-07-24: qty 1500

## 2012-07-24 MED ORDER — ACETAMINOPHEN 325 MG PO TABS
650.0000 mg | ORAL_TABLET | ORAL | Status: DC | PRN
  Administered 2012-07-24 – 2012-07-26 (×11): 650 mg via ORAL
  Filled 2012-07-24 (×12): qty 2

## 2012-07-24 MED ORDER — RENA-VITE PO TABS
1.0000 | ORAL_TABLET | Freq: Every day | ORAL | Status: DC
  Administered 2012-07-24 – 2012-07-26 (×3): 1 via ORAL
  Filled 2012-07-24 (×4): qty 1

## 2012-07-24 MED ORDER — INSULIN ASPART 100 UNIT/ML ~~LOC~~ SOLN
0.0000 [IU] | Freq: Three times a day (TID) | SUBCUTANEOUS | Status: DC
  Administered 2012-07-24: 2 [IU] via SUBCUTANEOUS
  Administered 2012-07-24 (×2): 1 [IU] via SUBCUTANEOUS
  Administered 2012-07-25: 2 [IU] via SUBCUTANEOUS
  Administered 2012-07-26 (×3): 1 [IU] via SUBCUTANEOUS
  Administered 2012-07-27: 3 [IU] via SUBCUTANEOUS

## 2012-07-24 MED ORDER — PIPERACILLIN-TAZOBACTAM IN DEX 2-0.25 GM/50ML IV SOLN
2.2500 g | Freq: Three times a day (TID) | INTRAVENOUS | Status: DC
  Administered 2012-07-24 – 2012-07-27 (×11): 2.25 g via INTRAVENOUS
  Filled 2012-07-24 (×13): qty 50

## 2012-07-24 MED ORDER — CALCITRIOL 0.25 MCG PO CAPS
0.2500 ug | ORAL_CAPSULE | Freq: Every day | ORAL | Status: DC
  Administered 2012-07-24 – 2012-07-27 (×4): 0.25 ug via ORAL
  Filled 2012-07-24 (×4): qty 1

## 2012-07-24 MED ORDER — ONDANSETRON HCL 4 MG PO TABS: 4.0000 mg | ORAL_TABLET | Freq: Four times a day (QID) | ORAL | Status: DC | PRN

## 2012-07-24 MED ORDER — CALCIUM ACETATE 667 MG PO CAPS
667.0000 mg | ORAL_CAPSULE | Freq: Three times a day (TID) | ORAL | Status: DC
  Administered 2012-07-24 – 2012-07-27 (×10): 667 mg via ORAL
  Filled 2012-07-24 (×13): qty 1

## 2012-07-24 NOTE — Progress Notes (Signed)
Attempt to stand Pt. Pt stood w/walker 30sec then set back dwon. Pt unable to ambulate at this time.   Change Pt gown and sheet d/t sweet. Monitoring temp q4 per order.

## 2012-07-24 NOTE — Progress Notes (Signed)
Pt has had headache most of day. Tylenol has brought relief , but has not eliminated. Will continue to monitor

## 2012-07-24 NOTE — Progress Notes (Signed)
ANTIBIOTIC CONSULT NOTE - INITIAL  Pharmacy Consult for Vancomycin and Zosyn  Indication: rule out sepsis  Allergies  Allergen Reactions  . Ivp Dye (Iodinated Diagnostic Agents) Hives  . Morphine And Related Other (See Comments)    Bradycardia states patient    Patient Measurements: Height: 6\' 1"  (185.4 cm) Weight: 182 lb 4.8 oz (82.691 kg) IBW/kg (Calculated) : 79.9  Vital Signs: Temp: 99.9 F (37.7 C) (07/11 0308) Temp src: Oral (07/11 0308) BP: 140/60 mmHg (07/11 0308) Pulse Rate: 91 (07/11 0308)  Labs:  Recent Labs  07/23/12 2313 07/23/12 2328  WBC 10.2  --   HGB 11.4* 12.2*  PLT 134*  --   CREATININE  --  5.00*   Estimated Creatinine Clearance: 16 ml/min (by C-G formula based on Cr of 5). No results found for this basename: VANCOTROUGH, VANCOPEAK, VANCORANDOM, GENTTROUGH, GENTPEAK, GENTRANDOM, TOBRATROUGH, TOBRAPEAK, TOBRARND, AMIKACINPEAK, AMIKACINTROU, AMIKACIN,  in the last 72 hours   Microbiology: No results found for this or any previous visit (from the past 720 hour(s)).  Medical History: Past Medical History  Diagnosis Date  . Hypertension   . Diabetes mellitus without complication   . Shortness of breath   . Pneumonia     2012  . Heart murmur   . Glaucoma   . Multiple myeloma, without mention of having achieved remission(203.00) 03/30/2012    Cytogenetic neg on 03/23/2012.  . End stage renal disease on dialysis   . Asthma     Medications:  Prescriptions prior to admission  Medication Sig Dispense Refill  . amLODipine (NORVASC) 10 MG tablet Take 10 mg by mouth every morning.      Marland Kitchen aspirin EC 81 MG tablet Take 81 mg by mouth daily.      . calcitRIOL (ROCALTROL) 0.25 MCG capsule Take 0.25 mcg by mouth daily.      . calcium acetate (PHOSLO) 667 MG capsule Take 1 capsule (667 mg total) by mouth 3 (three) times daily with meals.  9 capsule  0  . doxazosin (CARDURA) 2 MG tablet Take 2 mg by mouth at bedtime.      Marland Kitchen levothyroxine (SYNTHROID,  LEVOTHROID) 175 MCG tablet Take 175 mcg by mouth every morning.       . multivitamin (RENA-VIT) TABS tablet Take 1 tablet by mouth at bedtime.  30 tablet  0  . saxagliptin HCl (ONGLYZA) 5 MG TABS tablet Take 5 mg by mouth daily.       Assessment: 67 yo male with fevers for empiric antibiotics  Goal of Therapy:  Vancomycin pre-HD level 15-25  Plan:  Vancomycin 1500 mg IV now, then 750 mg IV after each HD Zosyn 2.25 g IV q8h  Eddie Candle 07/24/2012,3:14 AM

## 2012-07-24 NOTE — Progress Notes (Addendum)
11:31 AM I agree with HPI/GPe and A/P per Dr. Houston Siren      Patient seems to be doing better. States that episode yesterday has not happened on dialysis.  Usually feels fine after dialysis. Tolerating diet well now, no nausea no vomiting but states that he had 4 episodes of vomiting yesterday, even prior to having something to eat after the session of dialysis. This has not happened before either No diarrhea no chills no fever currently but not fever overnight No blurred vision no double vision no sputum no cough no cold  Alert pleasant oriented Chest clear-dressing to line in subclavian on rate seems clean with no purulence no tenderness Soft nontender nondistended abdomen Neurologically intact   Admission 07/18/09 with CAP Admission 11/1/-1 with ACDF c3-c4 per dr. Danielle Dess Diagnosed with MGUS 12/2010 Admission 03/18/12 for acute on chronic renal failure, ?CHF-was bradycardic on that admission, developed resp failure and needed to have Bi-papa and eventual dialysis at that time A-V fistula created 3/12 Re-do surgery of Fistula 05/08/12 Kidney biospy 05/28/12 sho0wed Glomerulosclerosis from DM  Patient Active Problem List   Diagnosis Date Noted  . Weakness generalized 07/24/2012  . Volume depletion 07/24/2012  . Encounter for adequacy testing for hemodialysis 05/05/2012  . End stage renal disease 05/05/2012  . Aftercare following surgery of the circulatory system, NEC 05/05/2012  . Multiple myeloma, without mention of having achieved remission(203.00) 03/30/2012  . Acute respiratory failure with hypoxia 03/19/2012  . Volume overload 03/19/2012  . Anemia in chronic kidney disease 03/19/2012  . Iron deficiency 03/19/2012  . Metabolic acidosis 03/19/2012  . Hyponatremia 03/19/2012  . Hypothyroidism 03/19/2012  . CKD (chronic kidney disease) stage 5, GFR less than 15 ml/min (progressive) 03/18/2012  . Bradycardia 03/18/2012  . Essential hypertension, benign 03/18/2012  . Diabetes mellitus  type 1, controlled, with complications 03/18/2012  . MGUS (monoclonal gammopathy of unknown significance) 02/28/2011   A/p  1. ? Sepsis-only source that I can think of at this point is indwelling subclavian line. I discussed my concerns with nephrologist who will evaluate this-I'm not sure that this is actually infected-maybe he can use his fistula as per Dr. Candie Chroman last note 6/17. Continue empiric vancomycin and Zosyn until blood cultures return. 2. End-stage renal disease, T/Th/Sat-patient will be dialyzed hopefully tomorrow. 3. Vomiting-? Rushie Goltz does have long-standing diabetes.  As is eating now we'll monitor. If this recurs he consider studies and smaller meals 4. MGUS- hemoglobin stable, platelets 122-chemotherapy Revlimid/Velcade/Dexamethasone apparently on hold pending second opinion from Dr. Marissa Calamity at [facility], as renal biopsy more consistent with a glomerulosclerosis rather than myeloma.  Doubtful that this contributes to overall current issues 5. Slightly hyperthyroid-TSH 0.264-unclear how to interpret in the setting of acute illness, needs repeat in about 3-4 weeks. Would continue current dosage of medication 6. Diabetes mellitus-discontinue oral therapy and cover with sliding scale insulin  Discussed  with daughters at bedside Inpatient  Pleas Koch, MD [facility] [phone]

## 2012-07-24 NOTE — H&P (Signed)
[facility] History and Physical  Jack [name] YNW:[id number] DOB: 10/08/1945    PCP:   Geraldo Pitter, MD   Chief Complaint: weakness, loss of appetite, nausea and vomiting.  HPI: Jack [name] is an 67 y.o. male with ESRD on HD (Tu Thrs Sat), DM, HTN, multiple myeloma, brought in by daughter because he has been weak since his dialysis yesterday.  Apparently he was having a long dialysis, and has been feeling weak since the dialysis.  There has been no shortness of breath, fever, chills, coughs, but he had nausea and vomiting without abdominal pain.  He has been feeling lightheadedness and so weak that he has not been able to get out of bed. Evaluation in the ER included Cr of 5.5, K of 3.3, WBC of 10K and Hb of 9 grams per DL.  In the ER, he spiked a temp of 101.4.  Hospitalist was asked to admit him for weakness from volume depletion caused by his recent HD.  Rewiew of Systems:  Constitutional: No significant weight loss or weight gain Eyes: Negative for eye pain, redness and discharge, diplopia, visual changes, or flashes of light. ENMT: Negative for ear pain, hoarseness, nasal congestion, sinus pressure and sore throat. No headaches; tinnitus, drooling, or problem swallowing. Cardiovascular: Negative for chest pain, palpitations, diaphoresis, dyspnea and peripheral edema. ; No orthopnea, PND Respiratory: Negative for cough, hemoptysis, wheezing and stridor. No pleuritic chestpain. Gastrointestinal: Negative for diarrhea, constipation, abdominal pain, melena, blood in stool, hematemesis, jaundice and rectal bleeding.    Genitourinary: Negative for frequency, dysuria, incontinence,flank pain and hematuria; Musculoskeletal: Negative for back pain and neck pain. Negative for swelling and trauma.;  Skin: . Negative for pruritus, rash, abrasions, bruising and skin lesion.; ulcerations Neuro: Negative for headache, lightheadedness and neck stiffness. Negative for weakness, altered  level of consciousness , altered mental status, burning feet, involuntary movement, seizure and syncope.  Psych: negative for anxiety, depression, insomnia, tearfulness, panic attacks, hallucinations, paranoia, suicidal or homicidal ideation    Past Medical History  Diagnosis Date  . Hypertension   . Diabetes mellitus without complication   . Shortness of breath   . Pneumonia     2012  . Heart murmur   . Glaucoma   . Multiple myeloma, without mention of having achieved remission(203.00) 03/30/2012    Cytogenetic neg on 03/23/2012.  . End stage renal disease on dialysis     Past Surgical History  Procedure Laterality Date  . Thyroidectomy    . Cervical disc surgery    . Eye surgery      CATARACTS  . Insertion of dialysis catheter Right 03/19/2012    Procedure: INSERTION OF DIALYSIS CATHETER;  Surgeon: Pryor Ochoa, MD;  Location: [facility] OR;  Service: Vascular;  Laterality: Right;  Right Internal Jugular  . Av fistula placement Left 03/25/2012    Procedure: ARTERIOVENOUS (AV) FISTULA CREATION;  Surgeon: Pryor Ochoa, MD;  Location: [facility] OR;  Service: Vascular;  Laterality: Left;  . Ligation of competing branches of arteriovenous fistula Left 05/08/2012    Procedure: LIGATION OF COMPETING BRANCHES OF ARTERIOVENOUS FISTULA;  Surgeon: Pryor Ochoa, MD;  Location: [facility] OR;  Service: Vascular;  Laterality: Left;  Ultrasound guided    Medications:  HOME MEDS: Prior to Admission medications   Medication Sig Start Date End Date Taking? Authorizing Provider  amLODipine (NORVASC) 10 MG tablet Take 10 mg by mouth every morning. 05/19/12  Yes Historical Provider, MD  aspirin EC 81 MG tablet Take 81  mg by mouth daily.   Yes Historical Provider, MD  calcitRIOL (ROCALTROL) 0.25 MCG capsule Take 0.25 mcg by mouth daily.   Yes Historical Provider, MD  calcium acetate (PHOSLO) 667 MG capsule Take 1 capsule (667 mg total) by mouth 3 (three) times daily with meals. 03/24/12  Yes Marinda Elk, MD   doxazosin (CARDURA) 2 MG tablet Take 2 mg by mouth at bedtime.   Yes Historical Provider, MD  levothyroxine (SYNTHROID, LEVOTHROID) 175 MCG tablet Take 175 mcg by mouth every morning.    Yes Historical Provider, MD  multivitamin (RENA-VIT) TABS tablet Take 1 tablet by mouth at bedtime. 03/24/12  Yes Marinda Elk, MD  saxagliptin HCl (ONGLYZA) 5 MG TABS tablet Take 5 mg by mouth daily.   Yes Historical Provider, MD     Allergies:  Allergies  Allergen Reactions  . Ivp Dye (Iodinated Diagnostic Agents) Hives  . Morphine And Related Other (See Comments)    Bradycardia states patient    Social History:   reports that he quit smoking about 30 years ago. His smoking use included Cigarettes. He smoked 0.00 packs per day. He has never used smokeless tobacco. He reports that  drinks alcohol. He reports that he does not use illicit drugs.  Family History: Family History  Problem Relation Age of Onset  . Diabetes Mother   . Cancer Mother     bone   . Kidney disease Father      Physical Exam: Filed Vitals:   07/23/12 2345 07/24/12 0030 07/24/12 0115 07/24/12 0200  BP: 113/63 103/57 119/55   Pulse: 95 105 88   Temp:    101.4 F (38.6 C)  TempSrc:    Oral  Resp: 25 25 22    SpO2: 92% 95% 92%    Blood pressure 119/55, pulse 88, temperature 101.4 F (38.6 C), temperature source Oral, resp. rate 22, SpO2 92.00%.  GEN:  Pleasant  patient lying in the stretcher in no acute distress; cooperative with exam. PSYCH:  alert and oriented x4; does not appear anxious or depressed; affect is appropriate. HEENT: Mucous membranes pink and anicteric; PERRLA; EOM intact; no cervical lymphadenopathy nor thyromegaly or carotid bruit; no JVD; There were no stridor. Neck is very supple. Breasts:: Not examined CHEST WALL: No tenderness.  There is a dialysis catheter on his right upper chest. CHEST: Normal respiration, clear to auscultation bilaterally.  HEART: Regular rate and rhythm.  There are no  murmur, rub, or gallops.   BACK: No kyphosis or scoliosis; no CVA tenderness ABDOMEN: soft and non-tender; no masses, no organomegaly, normal abdominal bowel sounds; no pannus; no intertriginous candida. There is no rebound and no distention. Rectal Exam: Not done EXTREMITIES: No bone or joint deformity; age-appropriate arthropathy of the hands and knees; no edema; no ulcerations.  There is no calf tenderness. Genitalia: not examined PULSES: 2+ and symmetric SKIN: Normal hydration no rash or ulceration CNS: Cranial nerves 2-12 grossly intact no focal lateralizing neurologic deficit.  Speech is fluent; uvula elevated with phonation, facial symmetry and tongue midline. DTR are normal bilaterally, cerebella exam is intact, barbinski is negative and strengths are equaled bilaterally.  No sensory loss.   Labs on Admission:  Basic Metabolic Panel:  Recent Labs Lab 07/23/12 2328  NA 138  K 3.3*  CL 96  GLUCOSE 199*  BUN 13  CREATININE 5.00*   Liver Function Tests: No results found for this basename: AST, ALT, ALKPHOS, BILITOT, PROT, ALBUMIN,  in the last 168 hours No  results found for this basename: LIPASE, AMYLASE,  in the last 168 hours No results found for this basename: AMMONIA,  in the last 168 hours CBC:  Recent Labs Lab 07/23/12 2313 07/23/12 2328  WBC 10.2  --   NEUTROABS 9.0*  --   HGB 11.4* 12.2*  HCT 34.7* 36.0*  MCV 87.2  --   PLT 134*  --    Cardiac Enzymes: No results found for this basename: CKTOTAL, CKMB, CKMBINDEX, TROPONINI,  in the last 168 hours  CBG: No results found for this basename: GLUCAP,  in the last 168 hours   Radiological Exams on Admission: Dg Chest 1 View  07/24/2012   *RADIOLOGY REPORT*  Clinical Data: Fever.  Dialysis patient.  CHEST - 1 VIEW  Comparison: 03/19/2012  Findings: Shallow inspiration with elevation of the right hemidiaphragm.  Stable appearance of right-sided central venous dialysis catheter.  Heart size and pulmonary  vascularity are normal.  No focal consolidation or airspace disease.  No blunting of costophrenic angles.  No pneumothorax.  Mediastinal contours appear intact.  Degenerative changes in the spine and shoulders.  IMPRESSION: Stable appearance of the chest since previous study.  No evidence of active pulmonary disease.   Original Report Authenticated By: Burman Nieves, M.D.   Assessment/Plan Present on Admission:  . Weakness generalized . Volume depletion . Hypothyroidism . Diabetes mellitus type 1, controlled, with complications . End stage renal disease . MGUS (monoclonal gammopathy of unknown significance) . Multiple myeloma, without mention of having achieved remission(203.00)  PLAN:  Will admit him for weakness likely because of volume depletion from recent HD.  He will be given IVF at 50cc per hour.  For his hypothyrodism, will continue with supplement and check TSH.  He will be given Carb and renal diet, along with sensitive SSI.  Because he is immunocompromised, and having an indwelling catheter, I will start him on Van/Zosyn now that he spiked a temp.  BC, urine culture, UA, and CXR have been order.  He will be admitted to [facility] service.  He is a full code.  Thank you for letting me help in his care.  Other plans as per orders.  Code Status: FULL Unk Lightning, MD. [facility] Pager [phone] 7pm to 7am.  07/24/2012, 2:56 AM

## 2012-07-24 NOTE — Progress Notes (Addendum)
Attempt to walk Pt to bathroom. Once standing Pt began to fall back set Pt back on bed then put Pt on BSC. Bm was smear

## 2012-07-24 NOTE — Progress Notes (Signed)
Pt on carb mob diet, page MD to check if this is correct giving Pt is on hemodia.

## 2012-07-24 NOTE — Progress Notes (Signed)
Jack [name] is an 67 y.o. male with ESRD on HD (Tu Thrs Sat), DM, HTN, multiple myeloma, brought in by daughter because he has been weak since his dialysis yesterday. He felt unusually weak post dialysis. There has been no shortness of breath, fever, chills, coughs, but he had nausea and vomiting without abdominal pain. He has been feeling lightheadedness and so weak that he has not been able to get out of bed. Evaluation in the ER included Cr of 5.5, K of 3.3, WBC of 10K and Hb of 9 grams per DL. In the ER, he spiked a temp of 101.4.   He was admitted for further evaluation.  Of note he has a hemodialysis catheter in his right IJ.  He has a lue av access s/p b-c AVF on 03/25/12  and 3 branch ligations on 05/08/12.  He is afebrile with vanc and zosyn on board.  Past Medical History  Diagnosis Date  . Hypertension   . Diabetes mellitus without complication   . Shortness of breath   . Pneumonia     2012  . Heart murmur   . Glaucoma   . Multiple myeloma, without mention of having achieved remission(203.00) 03/30/2012    Cytogenetic neg on 03/23/2012.  . End stage renal disease on dialysis   . Asthma    Past Surgical History  Procedure Laterality Date  . Thyroidectomy    . Cervical disc surgery    . Eye surgery      CATARACTS  . Insertion of dialysis catheter Right 03/19/2012    Procedure: INSERTION OF DIALYSIS CATHETER;  Surgeon: Pryor Ochoa, MD;  Location: [facility] OR;  Service: Vascular;  Laterality: Right;  Right Internal Jugular  . Av fistula placement Left 03/25/2012    Procedure: ARTERIOVENOUS (AV) FISTULA CREATION;  Surgeon: Pryor Ochoa, MD;  Location: [facility] OR;  Service: Vascular;  Laterality: Left;  . Ligation of competing branches of arteriovenous fistula Left 05/08/2012    Procedure: LIGATION OF COMPETING BRANCHES OF ARTERIOVENOUS FISTULA;  Surgeon: Pryor Ochoa, MD;  Location: [facility] OR;  Service: Vascular;  Laterality: Left;  Ultrasound guided   Social History:  reports that he quit  smoking about 30 years ago. His smoking use included Cigarettes. He smoked 0.00 packs per day. He has quit using smokeless tobacco. He reports that he does not drink alcohol or use illicit drugs. Allergies:  Allergies  Allergen Reactions  . Ivp Dye (Iodinated Diagnostic Agents) Hives  . Morphine And Related Other (See Comments)    Bradycardia states patient   Family History  Problem Relation Age of Onset  . Diabetes Mother   . Cancer Mother     bone   . Kidney disease Father     Medications:  Scheduled: . aspirin EC  81 mg Oral Daily  . calcitRIOL  0.25 mcg Oral Daily  . calcium acetate  667 mg Oral TID WC  . docusate sodium  100 mg Oral BID  . doxazosin  2 mg Oral QHS  . heparin  5,000 Units Subcutaneous Q8H  . insulin aspart  0-5 Units Subcutaneous QHS  . insulin aspart  0-9 Units Subcutaneous TID WC  . levothyroxine  175 mcg Oral QAC breakfast  . multivitamin  1 tablet Oral QHS  . piperacillin-tazobactam (ZOSYN)  IV  2.25 g Intravenous Q8H  . sodium chloride  3 mL Intravenous Q12H  . [START ON 07/25/2012] vancomycin  750 mg Intravenous Q T,Th,Sa-HD   ROS: as expressed in  HPI otherwise not pertinent  Blood pressure 121/66, pulse 83, temperature 99.1 F (37.3 C), temperature source Oral, resp. rate 20, height 6\' 1"  (1.854 m), weight 82.691 kg (182 lb 4.8 oz), SpO2 96.00%.  General appearance: alert, cooperative and appears stated age Head: Normocephalic, without obvious abnormality, atraumatic Eyes: negative Ears: normal TM's and external ear canals both ears Nose: Nares normal. Septum midline. Mucosa normal. No drainage or sinus tenderness. Throat: lips, mucosa, and tongue normal; teeth and gums normal Resp: clear to auscultation bilaterally Chest wall: no tenderness perm cath tunnel or exit site nontender and ij site at neck neg Cardio: regular rate and rhythm, systolic murmur: systolic ejection 2/6, crescendo and decrescendo at lower left sternal border and no rub GI:  soft, non-tender; bowel sounds normal; no masses,  no organomegaly Extremities: tr edema bilat Neurologic: nf Results for orders placed during the hospital encounter of 07/23/12 (from the past 48 hour(s))  CBC WITH DIFFERENTIAL     Status: Abnormal   Collection Time    07/23/12 11:13 PM      Result Value Range   WBC 10.2  4.0 - 10.5 K/uL   RBC 3.98 (*) 4.22 - 5.81 MIL/uL   Hemoglobin 11.4 (*) 13.0 - 17.0 g/dL   HCT 96.2 (*) 95.2 - 84.1 %   MCV 87.2  78.0 - 100.0 fL   MCH 28.6  26.0 - 34.0 pg   MCHC 32.9  30.0 - 36.0 g/dL   RDW 32.4  40.1 - 02.7 %   Platelets 134 (*) 150 - 400 K/uL   Neutrophils Relative % 88 (*) 43 - 77 %   Neutro Abs 9.0 (*) 1.7 - 7.7 K/uL   Lymphocytes Relative 4 (*) 12 - 46 %   Lymphs Abs 0.4 (*) 0.7 - 4.0 K/uL   Monocytes Relative 7  3 - 12 %   Monocytes Absolute 0.7  0.1 - 1.0 K/uL   Eosinophils Relative 1  0 - 5 %   Eosinophils Absolute 0.1  0.0 - 0.7 K/uL   Basophils Relative 0  0 - 1 %   Basophils Absolute 0.0  0.0 - 0.1 K/uL  POCT I-STAT TROPONIN I     Status: None   Collection Time    07/23/12 11:26 PM      Result Value Range   Troponin i, poc 0.01  0.00 - 0.08 ng/mL   Comment 3            Comment: Due to the release kinetics of cTnI,     a negative result within the first hours     of the onset of symptoms does not rule out     myocardial infarction with certainty.     If myocardial infarction is still suspected,     repeat the test at appropriate intervals.  POCT I-STAT, CHEM 8     Status: Abnormal   Collection Time    07/23/12 11:28 PM      Result Value Range   Sodium 138  135 - 145 mEq/L   Potassium 3.3 (*) 3.5 - 5.1 mEq/L   Chloride 96  96 - 112 mEq/L   BUN 13  6 - 23 mg/dL   Creatinine, Ser 2.53 (*) 0.50 - 1.35 mg/dL   Glucose, Bld 664 (*) 70 - 99 mg/dL   Calcium, Ion 4.03 (*) 1.13 - 1.30 mmol/L   TCO2 32  0 - 100 mmol/L   Hemoglobin 12.2 (*) 13.0 - 17.0 g/dL   HCT  36.0 (*) 39.0 - 52.0 %  GLUCOSE, CAPILLARY     Status: Abnormal    Collection Time    07/24/12  2:59 AM      Result Value Range   Glucose-Capillary 165 (*) 70 - 99 mg/dL  GLUCOSE, CAPILLARY     Status: Abnormal   Collection Time    07/24/12  6:56 AM      Result Value Range   Glucose-Capillary 142 (*) 70 - 99 mg/dL   Comment 1 Documented in Chart     Comment 2 Notify RN    GLUCOSE, CAPILLARY     Status: Abnormal   Collection Time    07/24/12  7:37 AM      Result Value Range   Glucose-Capillary 139 (*) 70 - 99 mg/dL  CBC     Status: Abnormal   Collection Time    07/24/12  9:20 AM      Result Value Range   WBC 8.2  4.0 - 10.5 K/uL   RBC 3.98 (*) 4.22 - 5.81 MIL/uL   Hemoglobin 11.0 (*) 13.0 - 17.0 g/dL   HCT 57.8 (*) 46.9 - 62.9 %   MCV 88.9  78.0 - 100.0 fL   MCH 27.6  26.0 - 34.0 pg   MCHC 31.1  30.0 - 36.0 g/dL   RDW 52.8  41.3 - 24.4 %   Platelets 122 (*) 150 - 400 K/uL  CREATININE, SERUM     Status: Abnormal   Collection Time    07/24/12  9:20 AM      Result Value Range   Creatinine, Ser 6.13 (*) 0.50 - 1.35 mg/dL   GFR calc non Af Amer 8 (*) >90 mL/min   GFR calc Af Amer 10 (*) >90 mL/min   Comment:            The eGFR has been calculated     using the CKD EPI equation.     This calculation has not been     validated in all clinical     situations.     eGFR's persistently     <90 mL/min signify     possible Chronic Kidney Disease.  TSH     Status: Abnormal   Collection Time    07/24/12  9:20 AM      Result Value Range   TSH 0.264 (*) 0.350 - 4.500 uIU/mL  GLUCOSE, CAPILLARY     Status: Abnormal   Collection Time    07/24/12 11:36 AM      Result Value Range   Glucose-Capillary 122 (*) 70 - 99 mg/dL   Comment 1 Documented in Chart     Comment 2 Notify RN    GLUCOSE, CAPILLARY     Status: Abnormal   Collection Time    07/24/12  4:24 PM      Result Value Range   Glucose-Capillary 157 (*) 70 - 99 mg/dL   Comment 1 Notify RN     Dg Chest 1 View  07/24/2012   *RADIOLOGY REPORT*  Clinical Data: Fever.  Dialysis patient.   CHEST - 1 VIEW  Comparison: 03/19/2012  Findings: Shallow inspiration with elevation of the right hemidiaphragm.  Stable appearance of right-sided central venous dialysis catheter.  Heart size and pulmonary vascularity are normal.  No focal consolidation or airspace disease.  No blunting of costophrenic angles.  No pneumothorax.  Mediastinal contours appear intact.  Degenerative changes in the spine and shoulders.  IMPRESSION: Stable appearance of the chest since previous  study.  No evidence of active pulmonary disease.   Original Report Authenticated By: Burman Nieves, M.D.    Assessment: 1 ESRD 2 Permcath with febrile illness 3 BC AVF left UE, possibly usable  Plan: 1 HD in Am 2 Will await BC; if [facility] pos for skin flora would remove cath 3 Will ask RN in dialysis to eval for single needle cannulation 4 Agree with antibiotics. 5 Will try to update medication as needed from outpt kidney center notes.  Laquincy Eastridge C 07/24/2012, 5:49 PM

## 2012-07-24 NOTE — Progress Notes (Signed)
Nutrition Brief Note  Patient identified on the Malnutrition Screening Tool (MST) Report for recent weight loss without trying.  Per readings below, patient's weight fluctuates more than likely given fluid and medical hx.  Wt Readings from Last 10 Encounters:  07/24/12 182 lb 4.8 oz (82.691 kg)  06/30/12 199 lb (90.266 kg)  06/10/12 190 lb 11.2 oz (86.501 kg)  05/28/12 185 lb (83.915 kg)  05/08/12 185 lb 3 oz (84 kg)  05/08/12 185 lb 3 oz (84 kg)  05/06/12 189 lb 6.4 oz (85.911 kg)  04/15/12 188 lb 4.8 oz (85.412 kg)  04/08/12 194 lb 8 oz (88.225 kg)  03/30/12 196 lb (88.905 kg)    Body mass index is 24.06 kg/(m^2). Patient meets criteria for Normal based on current BMI.   Current diet order is Carbohydrate Modified Medium Calorie, patient is consuming approximately 100% of meals at this time. Labs and medications reviewed.   No nutrition interventions warranted at this time. If nutrition issues arise, please consult RD.   Maureen Chatters, RD, LDN Pager #: [phone] After-Hours Pager #: [phone]

## 2012-07-25 LAB — CBC WITH DIFFERENTIAL/PLATELET
Basophils Absolute: 0 10*3/uL (ref 0.0–0.1)
Eosinophils Relative: 1 % (ref 0–5)
HCT: 34.3 % — ABNORMAL LOW (ref 39.0–52.0)
Lymphocytes Relative: 17 % (ref 12–46)
MCHC: 31.5 g/dL (ref 30.0–36.0)
MCV: 88.9 fL (ref 78.0–100.0)
Monocytes Absolute: 1 10*3/uL (ref 0.1–1.0)
Monocytes Relative: 11 % (ref 3–12)
RDW: 15 % (ref 11.5–15.5)
WBC: 9 10*3/uL (ref 4.0–10.5)

## 2012-07-25 LAB — RENAL FUNCTION PANEL
BUN: 27 mg/dL — ABNORMAL HIGH (ref 6–23)
Calcium: 8.8 mg/dL (ref 8.4–10.5)
Glucose, Bld: 149 mg/dL — ABNORMAL HIGH (ref 70–99)
Phosphorus: 4.4 mg/dL (ref 2.3–4.6)

## 2012-07-25 LAB — GLUCOSE, CAPILLARY
Glucose-Capillary: 137 mg/dL — ABNORMAL HIGH (ref 70–99)
Glucose-Capillary: 103 mg/dL — ABNORMAL HIGH (ref 70–99)

## 2012-07-25 LAB — HEPATITIS B SURFACE ANTIGEN: Hepatitis B Surface Ag: NEGATIVE

## 2012-07-25 NOTE — Progress Notes (Signed)
PROGRESS NOTE  Jack [name] ZOX:[id number] DOB: 1945/08/07 DOA: 07/23/2012 PCP: Geraldo Pitter, MD  Brief narrative: 67 yr old male with  ESRD [T/Th/Sat], DM, Htn, ?M Myeloma presented with weakness, n/v, Orthostasis and temp 101.4 post dialysis 07/23/12 and admitted.  Work-up so far negative, but has been c/o headache which is now somewhat better  Past medical history-As per Problem list  Consultants:  Nephrology  Procedures:  CXR 7/11 neg for acute disease  Antibiotics:  Vancomycin 7/10  Zosyn 7/10   Subjective  Feels fair.  Still Headache.  No furthe rn/v.  Needs to go to RR.  No productive cough, chills, sputum Rash, blurred or double vision   Objective    Interim History: none  Telemetry: 1 degree AVB  Objective: Filed Vitals:   07/24/12 1831 07/24/12 2117 07/25/12 0100 07/25/12 0417  BP: 118/60 134/63  141/75  Pulse: 75 71  73  Temp: 98.6 F (37 C) 99.3 F (37.4 C) 98.7 F (37.1 C) 99.3 F (37.4 C)  TempSrc: Oral Oral Oral Oral  Resp: 18 18  18   Height:      Weight:    86.637 kg (191 lb)  SpO2: 98% 97%  98%    Intake/Output Summary (Last 24 hours) at 07/25/12 0915 Last data filed at 07/25/12 0836  Gross per 24 hour  Intake 2421.83 ml  Output      1 ml  Net 2420.83 ml    Exam:  General: EOMI, noted lipoma on back of skull Cardiovascular: s1 s2 no m/r/g, no added sound Respiratory: clear, no added sound Abdomen:  Soft, NT/ND, no rebound Skin thrill to L side WNL-dialysis cath not evlauted today Neuro intact, no brudzinski/kernig's  Data Reviewed: Basic Metabolic Panel:  Recent Labs Lab 07/23/12 2328 07/24/12 0920 07/25/12 0520  NA 138  --  136  K 3.3*  --  3.4*  CL 96  --  95*  CO2  --   --  30  GLUCOSE 199*  --  149*  BUN 13  --  27*  CREATININE 5.00* 6.13* 7.41*  CALCIUM  --   --  8.8  PHOS  --   --  4.4   Liver Function Tests:  Recent Labs Lab 07/25/12 0520  ALBUMIN 3.0*   No results found for this basename:  LIPASE, AMYLASE,  in the last 168 hours No results found for this basename: AMMONIA,  in the last 168 hours CBC:  Recent Labs Lab 07/23/12 2313 07/23/12 2328 07/24/12 0920 07/25/12 0520  WBC 10.2  --  8.2 9.0  NEUTROABS 9.0*  --   --  6.3  HGB 11.4* 12.2* 11.0* 10.8*  HCT 34.7* 36.0* 35.4* 34.3*  MCV 87.2  --  88.9 88.9  PLT 134*  --  122* 123*   Cardiac Enzymes: No results found for this basename: CKTOTAL, CKMB, CKMBINDEX, TROPONINI,  in the last 168 hours BNP: No components found with this basename: POCBNP,  CBG:  Recent Labs Lab 07/24/12 0737 07/24/12 1136 07/24/12 1624 07/24/12 2046 07/25/12 0556  GLUCAP 139* 122* 157* 156* 156*    No results found for this or any previous visit (from the past 240 hour(s)).   Studies:              All Imaging reviewed and is as per above notation   Scheduled Meds: . aspirin EC  81 mg Oral Daily  . calcitRIOL  0.25 mcg Oral Daily  . calcium acetate  667 mg Oral TID  WC  . docusate sodium  100 mg Oral BID  . doxazosin  2 mg Oral QHS  . heparin  5,000 Units Subcutaneous Q8H  . insulin aspart  0-5 Units Subcutaneous QHS  . insulin aspart  0-9 Units Subcutaneous TID WC  . levothyroxine  175 mcg Oral QAC breakfast  . multivitamin  1 tablet Oral QHS  . piperacillin-tazobactam (ZOSYN)  IV  2.25 g Intravenous Q8H  . sodium chloride  3 mL Intravenous Q12H  . vancomycin  750 mg Intravenous Q T,Th,Sa-HD   Continuous Infusions: . dextrose 5 % and 0.9% NaCl 50 mL/hr at 07/25/12 7829     Assessment/Plan: 1. ? Sepsis-only source that I can think of at this point is indwelling subclavian line-His headaches are not severe/concernng enough to suspect ? Meningitis, and are better.. Continue empiric vancomycin and Zosyn until blood cultures return-UC are pending 2. End-stage renal disease, T/Th/Sat-patient will be dialyzed per renal 3. Vomiting-? Rushie Goltz does have long-standing diabetes. As is eating now we'll monitor. If this  recurs he consider studies and smaller meals 4. MGUS- hemoglobin stable, platelets 122-chemotherapy Revlimid/Velcade/Dexamethasone apparently on hold pending second opinion from Dr. Marissa Calamity at [facility], as renal biopsy more consistent with a glomerulosclerosis rather than myeloma. Doubtful that this contributes to overall current issues 5. Slightly hyperthyroid-TSH 0.264-unclear how to interpret in the setting of acute illness, needs repeat in about 3-4 weeks. Would continue current dosage of medication 6. Diabetes mellitus-discontinue oral therapy and cover with sliding scale insulin.  D5/1/2 NS d/c 7/12 7. Immobility-Get Pt/Ot to eval as felt somewhat dizzy/orthostatic s/p dialysis.    Code Status: Full Family Communication:  None at bedside today Disposition Plan:  inpatient   Pleas Koch, MD  [facility] Pager [phone] 07/25/2012, 9:15 AM    LOS: 2 days

## 2012-07-25 NOTE — Progress Notes (Signed)
Subjective:  Still with mild headache, but overall feeling better  Objective: Vital signs in last 24 hours: Temp:  [98.6 F (37 C)-99.3 F (37.4 C)] 99.3 F (37.4 C) (07/12 0417) Pulse Rate:  [65-83] 73 (07/12 0417) Resp:  [18-20] 18 (07/12 0417) BP: (118-141)/(51-75) 141/75 mmHg (07/12 0417) SpO2:  [96 %-98 %] 98 % (07/12 0417) Weight:  [86.637 kg (191 lb)] 86.637 kg (191 lb) (07/12 0417) Weight change: 3.946 kg (8 lb 11.2 oz)  Intake/Output from previous day: 07/11 0701 - 07/12 0700 In: 2546 [P.O.:1358; I.V.:1038; IV Piggyback:150] Out: 1 [Stool:1] Intake/Output this shift: Total I/O In: 240 [P.O.:240] Out: -  EXAM: General appearance:  Alert, in no apparent distress Resp:  CTA without rales, rhonchi, or wheezes Cardio:  RRR without murmur or rub GI:  + BS, soft and nontender Extremities:  No edema Access:  R IJ catheter, AVF @ LUA with + bruit  Lab Results:  Recent Labs  07/24/12 0920 07/25/12 0520  WBC 8.2 9.0  HGB 11.0* 10.8*  HCT 35.4* 34.3*  PLT 122* 123*   BMET:  Recent Labs  07/23/12 2328 07/24/12 0920 07/25/12 0520  NA 138  --  136  K 3.3*  --  3.4*  CL 96  --  95*  CO2  --   --  30  GLUCOSE 199*  --  149*  BUN 13  --  27*  CREATININE 5.00* 6.13* 7.41*  CALCIUM  --   --  8.8  ALBUMIN  --   --  3.0*   No results found for this basename: PTH,  in the last 72 hours Iron Studies: No results found for this basename: IRON, TIBC, TRANSFERRIN, FERRITIN,  in the last 72 hours  Assessment/Plan: 1. Fever/weakness/N & V - ? Sepsis although so far workup is negative, urine culture 7/5 negative, blood cultures 7/11 pending; with R IJ dialysis catheter; feeling better on Vancomycin & Zosyn. 2. ESRD - HD on TTS @ [facility]; K 3.4 today.  HD pending today. 3. HTN/Volume - BP 141/75, on Doxazosin; current wt 86.6 kg with no signs or symptoms of fluid overload. 4. Anemia - Hgb 10.8 on outpatient Epogen. 5. Secondary hyperparathyroidism - Ca 8.8 (9.6 corrected), P  4.4; Calcitriol 0.25 mcg qd, Phoslo 1 with meals. 6. Nutrition - Alb 3, renal diet vitamin. 7. Dialysis access - Using R IJ catheter; AVF @ LUA placed 3/12 by Dr. Hart Rochester, ligation of competing branches x 3 4/25; seen in office by Dr. Hart Rochester 6/17 after infiltration, may use in 2 weeks.  Should be ready to use 8. Hx MGUS - s/p chemo for multiple myeloma, kidney biopsy 5/15 showed no evidence of MM, followed by Dr.  Gaylyn Rong.  9. DM Type 2 - on insulin per primary.    LOS: 2 days   Jack [name],Jack [name] 07/25/2012,10:28 AM As above.  Plan for HD today. Blaise Grieshaber C

## 2012-07-25 NOTE — Progress Notes (Signed)
Pt assessment completed, still very weak, c/o on headache, Tylenol given as needed with relieve. Pt resting comfortable on bed no distress noticed. We'll continue with POC.

## 2012-07-25 NOTE — Procedures (Signed)
Tolerating HD.  Feels better. Using one needle and one port of catheter.  Afebrile. Jack [name] C

## 2012-07-25 NOTE — Progress Notes (Signed)
PT Cancellation Note  Patient Details Name: Jack [name] MRN: [id number] DOB: [birth year]   Cancelled Treatment:     Pt currently HD.  Will attempt to see tomorrow (07/26/12).  Dilia Alemany 07/25/2012, 3:08 PM Jake Shark, PT DPT [phone]

## 2012-07-26 DIAGNOSIS — N186 End stage renal disease: Secondary | ICD-10-CM

## 2012-07-26 LAB — GLUCOSE, CAPILLARY
Glucose-Capillary: 137 mg/dL — ABNORMAL HIGH (ref 70–99)
Glucose-Capillary: 121 mg/dL — ABNORMAL HIGH (ref 70–99)
Glucose-Capillary: 164 mg/dL — ABNORMAL HIGH (ref 70–99)

## 2012-07-26 LAB — CBC
Hemoglobin: 10.6 g/dL — ABNORMAL LOW (ref 13.0–17.0)
MCH: 28.1 pg (ref 26.0–34.0)
MCHC: 31.6 g/dL (ref 30.0–36.0)
MCV: 88.9 fL (ref 78.0–100.0)
RBC: 3.77 MIL/uL — ABNORMAL LOW (ref 4.22–5.81)

## 2012-07-26 NOTE — Progress Notes (Signed)
PROGRESS NOTE  Jack [name] RUE:[id number] DOB: Oct 02, 1945 DOA: 07/23/2012 PCP: Geraldo Pitter, MD  Brief narrative: 67 yr old male with  ESRD [T/Th/Sat], DM, Htn, ?M Myeloma presented with weakness, n/v, Orthostasis and temp 101.4 post dialysis 07/23/12 and admitted.  Work-up was + for [facility] with Gram + rods, and was c/o headache which is now somewhat better  Past medical history-As per Problem list  Consultants:  Nephrology  Procedures:  CXR 7/11 neg for acute disease  Antibiotics:  Vancomycin 7/10  Zosyn 7/10   Subjective  Feels fair.  Headaches better.  Has had some loose stools over past 1-2 days Denies n/v/cp currently Note that Diatek catheter removed this am by vascular   Objective    Interim History: none  Telemetry: 1 degree AVB  Objective: Filed Vitals:   07/25/12 1616 07/25/12 2000 07/26/12 0449 07/26/12 1318  BP: 150/67 137/65 127/64 135/74  Pulse: 109 92 79 73  Temp: 98.4 F (36.9 C) 98.9 F (37.2 C) 99.6 F (37.6 C) 97.9 F (36.6 C)  TempSrc: Oral Oral Oral Oral  Resp:  18 18 18   Height:      Weight:   85.866 kg (189 lb 4.8 oz)   SpO2: 96% 97% 97% 100%    Intake/Output Summary (Last 24 hours) at 07/26/12 1423 Last data filed at 07/26/12 1339  Gross per 24 hour  Intake    600 ml  Output   2003 ml  Net  -1403 ml    Exam:  General: EOMI, noted lipoma on back of skull Cardiovascular: s1 s2 no m/r/g, no added sound Respiratory: clear, no added sound Abdomen:  Soft, NT/ND, no rebound Skin thrill to L side WNL-dialysis cath not evlauted today Neuro intact, no brudzinski/kernig's  Data Reviewed: Basic Metabolic Panel:  Recent Labs Lab 07/23/12 2328 07/24/12 0920 07/25/12 0520  NA 138  --  136  K 3.3*  --  3.4*  CL 96  --  95*  CO2  --   --  30  GLUCOSE 199*  --  149*  BUN 13  --  27*  CREATININE 5.00* 6.13* 7.41*  CALCIUM  --   --  8.8  PHOS  --   --  4.4   Liver Function Tests:  Recent Labs Lab 07/25/12 0520   ALBUMIN 3.0*   No results found for this basename: LIPASE, AMYLASE,  in the last 168 hours No results found for this basename: AMMONIA,  in the last 168 hours CBC:  Recent Labs Lab 07/23/12 2313 07/23/12 2328 07/24/12 0920 07/25/12 0520 07/26/12 0445  WBC 10.2  --  8.2 9.0 10.4  NEUTROABS 9.0*  --   --  6.3  --   HGB 11.4* 12.2* 11.0* 10.8* 10.6*  HCT 34.7* 36.0* 35.4* 34.3* 33.5*  MCV 87.2  --  88.9 88.9 88.9  PLT 134*  --  122* 123* 124*   Cardiac Enzymes: No results found for this basename: CKTOTAL, CKMB, CKMBINDEX, TROPONINI,  in the last 168 hours BNP: No components found with this basename: POCBNP,  CBG:  Recent Labs Lab 07/25/12 1137 07/25/12 1615 07/25/12 2118 07/25/12 2216 07/26/12 1059  GLUCAP 137* 103* 217* 177* 137*    Recent Results (from the past 240 hour(s))  CULTURE, BLOOD (ROUTINE X 2)     Status: None   Collection Time    07/24/12  6:05 AM      Result Value Range Status   Specimen Description BLOOD RIGHT ARM   Final  Special Requests BOTTLES DRAWN AEROBIC ONLY 10CC   Final   Culture  Setup Time 07/24/2012 13:58   Final   Culture     Final   Value: GRAM POSITIVE RODS     Note: Gram Stain Report Called to,Read Back By and Verified With: PEGGY GERHARD 07/26/12 @ 10:11AM BY RUSCOE A.   Report Status PENDING   Incomplete  CULTURE, BLOOD (ROUTINE X 2)     Status: None   Collection Time    07/24/12  6:10 AM      Result Value Range Status   Specimen Description BLOOD LEFT ARM   Final   Special Requests BOTTLES DRAWN AEROBIC ONLY 10CC   Final   Culture  Setup Time 07/24/2012 14:01   Final   Culture     Final   Value: GRAM POSITIVE RODS     Note: Gram Stain Report Called to,Read Back By and Verified With: PEGGY GERHARD 07/26/12 @ 10:11AM BY RUSCOE A.   Report Status PENDING   Incomplete     Studies:              All Imaging reviewed and is as per above notation   Scheduled Meds: . aspirin EC  81 mg Oral Daily  . calcitRIOL  0.25 mcg Oral  Daily  . calcium acetate  667 mg Oral TID WC  . docusate sodium  100 mg Oral BID  . doxazosin  2 mg Oral QHS  . heparin  5,000 Units Subcutaneous Q8H  . insulin aspart  0-5 Units Subcutaneous QHS  . insulin aspart  0-9 Units Subcutaneous TID WC  . levothyroxine  175 mcg Oral QAC breakfast  . multivitamin  1 tablet Oral QHS  . piperacillin-tazobactam (ZOSYN)  IV  2.25 g Intravenous Q8H  . sodium chloride  3 mL Intravenous Q12H  . vancomycin  750 mg Intravenous Q T,Th,Sa-HD   Continuous Infusions:     Assessment/Plan: 1. ? Sepsis-2/2 to gram + Bacteremia? Infected subclavian line ?Diarrhea-reports 2-3 episodes at least a loose stools over past couple days. Not sure of antibiotic associated or caused by antibiotics and C. difficile. Monitor and if continues will get C. difficile PCR-await speciation of organisms, weight resolution of diarrhea will monitor 2. End-stage renal disease, T/Th/Sat-patient will be dialyzed per renal 3. Vomiting-resolved 4. MGUS- hemoglobin stable, platelets 122-chemotherapy Revlimid/Velcade/Dexamethasone apparently on hold pending second opinion from Dr. Marissa Calamity at [facility], as renal biopsy more consistent with a glomerulosclerosis rather than myeloma. Doubtful that this contributes to overall current issues 5. Slightly hyperthyroid-TSH 0.264-unclear how to interpret in the setting of acute illness, needs repeat in about 3-4 weeks. Would continue current dosage of medication 6. Diabetes mellitus-discontinue oral therapy and cover with sliding scale insulin.  D5/1/2 NS d/c 7/12 blood sugars well controlled 130s 140s 7. Immobility-Get Pt/Ot to eval as felt somewhat dizzy/orthostatic s/p dialysis.    Code Status: Full Family Communication:  None at bedside today Disposition Plan:  Inpatient-For physical therapy to evaluate the determine best venue for convalescence   Pleas Koch, MD  [facility] Pager [phone] 07/26/2012, 2:23 PM    LOS: 3 days

## 2012-07-26 NOTE — Consult Note (Signed)
[facility] H&P  CC:  Catheter removal   HPI:  This is a 67 y.o. male here for diatek catheter removal. He has  ESRD on HD (Tu Thrs Sat), DM, HTN, multiple myeloma, brought in by daughter because he has been weak since his dialysis yesterday. Apparently he was having a long dialysis, and has been feeling weak since the dialysis. There has been no shortness of breath, fever, chills, coughs, but he had nausea and vomiting without abdominal pain. He has been feeling lightheadedness and so weak that he has not been able to get out of bed. Evaluation in the ER included Cr of 5.5, K of 3.3, WBC of 10K and Hb of 9 grams per DL. In the ER, he spiked a temp of 101.4. Hospitalist was asked to admit him for weakness from volume depletion caused by his recent HD.   Dr. Hart Rochester placed the fistula March 2014 and it is working well with a palpable thrill left upper arm.  We have been asked to remove the diatek secondary to infection causes.     Past Medical History  Diagnosis Date  . Hypertension   . Diabetes mellitus without complication   . Shortness of breath   . Pneumonia     2012  . Heart murmur   . Glaucoma   . Multiple myeloma, without mention of having achieved remission(203.00) 03/30/2012    Cytogenetic neg on 03/23/2012.  . End stage renal disease on dialysis   . Asthma     FH:  Mother diabetes,and cancer, Father Kidney disease.  History   Social History  . Marital Status: Divorced    Spouse Name: N/A    Number of Children: N/A  . Years of Education: N/A   Occupational History  . Not on file.   Social History Main Topics  . Smoking status: Former Smoker    Types: Cigarettes    Quit date: 03/19/1982  . Smokeless tobacco: Former Neurosurgeon  . Alcohol Use: No  . Drug Use: No  . Sexually Active: No   Other Topics Concern  . Not on file   Social History Narrative  . No narrative on file    Allergies  Allergen Reactions  . Ivp Dye (Iodinated Diagnostic Agents)  Hives  . Morphine And Related Other (See Comments)    Bradycardia states patient    Current Facility-Administered Medications  Medication Dose Route Frequency Provider Last Rate Last Dose  . acetaminophen (TYLENOL) tablet 650 mg  650 mg Oral Q4H PRN Houston Siren, MD   650 mg at 07/26/12 1040  . aspirin EC tablet 81 mg  81 mg Oral Daily Houston Siren, MD   81 mg at 07/26/12 1040  . calcitRIOL (ROCALTROL) capsule 0.25 mcg  0.25 mcg Oral Daily Houston Siren, MD   0.25 mcg at 07/26/12 1040  . calcium acetate (PHOSLO) capsule 667 mg  667 mg Oral TID WC Houston Siren, MD   667 mg at 07/26/12 1304  . docusate sodium (COLACE) capsule 100 mg  100 mg Oral BID Houston Siren, MD   100 mg at 07/25/12 1035  . doxazosin (CARDURA) tablet 2 mg  2 mg Oral QHS Houston Siren, MD   2 mg at 07/25/12 2219  . heparin injection 5,000 Units  5,000 Units Subcutaneous Q8H Houston Siren, MD   5,000 Units at 07/26/12 [phone]  . insulin aspart (novoLOG) injection 0-5 Units  0-5 Units Subcutaneous QHS Houston Siren, MD      . insulin  aspart (novoLOG) injection 0-9 Units  0-9 Units Subcutaneous TID WC Houston Siren, MD   1 Units at 07/26/12 1305  . levothyroxine (SYNTHROID, LEVOTHROID) tablet 175 mcg  175 mcg Oral QAC breakfast Houston Siren, MD   175 mcg at 07/26/12 [phone]  . multivitamin (RENA-VIT) tablet 1 tablet  1 tablet Oral QHS Houston Siren, MD   1 tablet at 07/25/12 2239  . ondansetron (ZOFRAN) tablet 4 mg  4 mg Oral Q6H PRN Houston Siren, MD       Or  . ondansetron [facility]) injection 4 mg  4 mg Intravenous Q6H PRN Houston Siren, MD      . piperacillin-tazobactam (ZOSYN) IVPB 2.25 g  2.25 g Intravenous Q8H Houston Siren, MD   2.25 g at 07/26/12 7829  . sodium chloride 0.9 % injection 3 mL  3 mL Intravenous Q12H Houston Siren, MD   3 mL at 07/26/12 1306  . vancomycin (VANCOCIN) IVPB 750 mg/150 ml premix  750 mg Intravenous Q T,Th,Sa-HD Houston Siren, MD   750 mg at 07/25/12 1523    ROS:  Constitutional:No unexplained weight gain or loss Eyes:no vision changes Cardiovascular No chest pain, no  irregular heart beats, no pass maker implants Respiratory No SOB with rest or exertion. Gastrointestinal No N/V/D No melena Genitourinary ESRD on dialysis Skin:  No rashes or open wounds Musculoskeletal: Motor intact and equal in all four extremities, no waisting or atrophy      PHYSICAL EXAM  Filed Vitals:   07/26/12 1318  BP: 135/74  Pulse: 73  Temp: 97.9 F (36.6 C)  Resp: 18    Gen:  Well developed well nourished HEENT:  normocephalic Neck:  Full range of motion, no adenopathy Heart:  RRR Lungs:  Non-labored Abdomen:  Soft NTTP Extremities:  Full range of motion strength 5/5 Skin:  No obvious rashes     Impression: This is a 67 y.o. male here for diatek catheter removal  Plan:  Removal of right diatek catheter  Lianne Cure PA-C [facility] [phone] 07/26/2012 1:38 PM

## 2012-07-26 NOTE — Progress Notes (Signed)
[facility] Catheter Removal Procedure Note  Diagnosis: ESRD with Functioning AVF/AVGG  Plan:  Remove right diatek catheter  Consent signed:  yes Time out completed:  yes Coumadin:  no PT/INR (if applicable):   Other labs:   Procedure: 1.  Sterile prepping and draping over catheter area 2. 0 ml 2% lidocaine plain instilled at removal site. 3.  right catheter removed in its entirety with cuff in tact. 4.  Complications: none  5. Tip of catheter sent for culture:  yes   Patient tolerated procedure well:  yes Pressure held, no bleeding noted, dressing applied Instructions given to the pt regarding wound care and bleeding.  OtherClinton Gallant [facility] 07/26/2012 1:53 PM

## 2012-07-26 NOTE — Progress Notes (Signed)
Subjective:   No complaints, headache resolved, feeling well  Objective: Vital signs in last 24 hours: Temp:  [98.1 F (36.7 C)-99.6 F (37.6 C)] 99.6 F (37.6 C) (07/13 0449) Pulse Rate:  [70-109] 79 (07/13 0449) Resp:  [13-26] 18 (07/13 0449) BP: (117-150)/(46-72) 127/64 mmHg (07/13 0449) SpO2:  [96 %-99 %] 97 % (07/13 0449) Weight:  [85.866 kg (189 lb 4.8 oz)-90 kg (198 lb 6.6 oz)] 85.866 kg (189 lb 4.8 oz) (07/13 0449) Weight change: 3.363 kg (7 lb 6.6 oz)  Intake/Output from previous day: 07/12 0701 - 07/13 0700 In: 240 [P.O.:240] Out: 2004 [Stool:4] Intake/Output this shift: Total I/O In: -  Out: 3 [Stool:3] EXAM: General appearance:  Alert, in no apparent distress Resp:  CTA without rales, rhonchi, or wheezes Cardio:  RRR with Gr II/VI murmur, no rub GI:  + BS, soft and nontender Extremities:  No edema Access:  R IJ catheter, AVF @ LUA with + bruit  Lab Results:  Recent Labs  07/25/12 0520 07/26/12 0445  WBC 9.0 10.4  HGB 10.8* 10.6*  HCT 34.3* 33.5*  PLT 123* 124*   BMET:  Recent Labs  07/23/12 2328 07/24/12 0920 07/25/12 0520  NA 138  --  136  K 3.3*  --  3.4*  CL 96  --  95*  CO2  --   --  30  GLUCOSE 199*  --  149*  BUN 13  --  27*  CREATININE 5.00* 6.13* 7.41*  CALCIUM  --   --  8.8  ALBUMIN  --   --  3.0*   No results found for this basename: PTH,  in the last 72 hours Iron Studies: No results found for this basename: IRON, TIBC, TRANSFERRIN, FERRITIN,  in the last 72 hours  Assessment/Plan: 1. Fever/weakness/N & V - Sepsis with positive BC for gram positive rods, urine culture 7/5 negative, blood cultures 7/11 now positive with R IJ dialysis catheter; improved on Vancomycin & Zosyn. Will ask for catheter removal 2. ESRD - HD on TTS @ [facility]; K 3.4 pre-HD yesterday. Next HD on 7/15.  3. HTN/Volume - BP 127/64, on Doxazosin; wt 85.9 kg s/p net UF 2 L yesterday.  4. Anemia - Hgb 10.6 on outpatient Epogen, currently no Aranesp.  5. Secondary  hyperparathyroidism - Ca 8.8 (9.6 corrected), P 4.4; Calcitriol 0.25 mcg qd, Phoslo 1 with meals.  6. Nutrition - Alb 3, renal diet vitamin.  7. Dialysis access - Using R IJ catheter; AVF @ LUA placed 3/12 by Dr. Hart Rochester, ligation of competing branches x 3 4/25; seen in office by Dr. Hart Rochester 6/17 after infiltration, "may use in 2 weeks"; used 1 line yesterday.  Will get cath out and try AVF with 2 needles. 8. Hx MGUS - s/p chemo for multiple myeloma, kidney biopsy 5/15 showed no evidence of MM, followed by Dr. Gaylyn Rong.  9. DM Type 2 - on insulin per primary.    LOS: 3 days   LYLES,CHARLES 07/26/2012,6:57 AM

## 2012-07-26 NOTE — Evaluation (Addendum)
Physical Therapy Evaluation Patient Details Name: Jack [name] MRN: [id number] DOB: [birth year] Today's Date: 07/26/2012 Time: 4098-1191 PT Time Calculation (min): 21 min  PT Assessment / Plan / Recommendation History of Present Illness  Jack [name] is an 67 y.o. male with ESRD on HD (Tu Thrs Sat), DM, HTN, multiple myeloma, brought in by daughter because he has been weak since his dialysis yesterday.  Apparently he was having a long dialysis, and has been feeling weak since the dialysis.  Found to have dialysis catheter infection.   Clinical Impression  Pt willing to get OOB but did not want to ambulate in hallway.  Pt will benefit from acute PT services to improve overall mobility and prepare for safe d/c home with family.     PT Assessment  Patient needs continued PT services    Follow Up Recommendations  Supervision - Intermittent;No PT follow up    Does the patient have the potential to tolerate intense rehabilitation      Barriers to Discharge        Equipment Recommendations  Rolling walker with 5" wheels    Recommendations for Other Services     Frequency Min 3X/week    Precautions / Restrictions Precautions Precautions: None Restrictions Weight Bearing Restrictions: No   Pertinent Vitals/Pain No c/o pain      Mobility  Bed Mobility Bed Mobility: Supine to Sit;Sitting - Scoot to Edge of Bed Supine to Sit: 4: Min guard;With rails Sitting - Scoot to Edge of Bed: 4: Min guard;With rail Details for Bed Mobility Assistance: Minguard for safety with cues for technique Transfers Transfers: Sit to Stand;Stand to Sit Sit to Stand: 4: Min guard;From bed Stand to Sit: 4: Min guard;To chair/3-in-1 Details for Transfer Assistance: Minguard for safety Ambulation/Gait Ambulation/Gait Assistance: 4: Min guard Ambulation Distance (Feet): 20 Feet Assistive device: Rolling walker Ambulation/Gait Assistance Details: Minguard for safety with cues for RW  placement Gait Pattern: Step-through pattern;Decreased stride length;Shuffle Gait velocity: decreased Stairs: No Wheelchair Mobility Wheelchair Mobility: No    Exercises     PT Diagnosis: Difficulty walking;Generalized weakness  PT Problem List: Decreased strength;Decreased activity tolerance;Decreased balance;Decreased mobility;Decreased knowledge of use of DME;Decreased knowledge of precautions PT Treatment Interventions: DME instruction;Stair training;Functional mobility training;Therapeutic activities;Therapeutic exercise;Balance training;Gait training;Patient/family education     PT Goals(Current goals can be found in the care plan section) Acute Rehab PT Goals Patient Stated Goal: To go home soon PT Goal Formulation: With patient/family Potential to Achieve Goals: Good  Visit Information  Last PT Received On: 07/26/12 Assistance Needed: +1 History of Present Illness: Jack [name] is an 67 y.o. male with ESRD on HD (Tu Thrs Sat), DM, HTN, multiple myeloma, brought in by daughter because he has been weak since his dialysis yesterday.  Apparently he was having a long dialysis, and has been feeling weak since the dialysis       Prior Functioning  Home Living Family/patient expects to be discharged to:: Private residence Living Arrangements: Other relatives;Children Available Help at Discharge: Family Type of Home: Apartment Home Access: Level entry Home Layout: One level Home Equipment: Cane - single point;Walker - 2 wheels Prior Function Level of Independence: Independent with assistive device(s) (with SPC vs RW depending on the day) Communication Communication: No difficulties Dominant Hand: Right    Cognition  Cognition Arousal/Alertness: Awake/alert Behavior During Therapy: WFL for tasks assessed/performed Overall Cognitive Status: Within Functional Limits for tasks assessed    Extremity/Trunk Assessment Lower Extremity Assessment Lower Extremity  Assessment: Generalized  weakness   Balance    End of Session PT - End of Session Equipment Utilized During Treatment: Gait belt Activity Tolerance: Patient tolerated treatment well Patient left: in chair;with call bell/phone within reach Nurse Communication: Mobility status  GP     Fount Bahe 07/26/2012, 12:43 PM  Jake Shark, PT DPT [phone]

## 2012-07-27 LAB — GLUCOSE, CAPILLARY

## 2012-07-27 MED ORDER — PIPERACILLIN-TAZOBACTAM IN DEX 2-0.25 GM/50ML IV SOLN: 2.2500 g | Freq: Three times a day (TID) | INTRAVENOUS | Status: DC

## 2012-07-27 MED ORDER — VANCOMYCIN HCL IN DEXTROSE 750-5 MG/150ML-% IV SOLN: 750.0000 mg | INTRAVENOUS | Status: DC

## 2012-07-27 NOTE — Discharge Summary (Addendum)
Physician Discharge Summary  Jack [name] JXB:[id number] DOB: [birth year] DOA: 07/23/2012  PCP: Geraldo Pitter, MD  Admit date: 07/23/2012 Discharge date: 07/27/2012  Time spent: 35 minutes  Recommendations for Outpatient Follow-up:  1.  Vancomycin trough and peak levels in about a week-likely needs 2 wekes total abx iv 2. Recommend outpatient TSH again in about 3 as he seemed to have some slight thyroidism unclear how to interpret in the setting of acute illness 3. Recommend renal panel in about a week 4. Follow up with oncologist for further discussion about potential MGUS 5. Monitor for fevers chills and diarrhea   Discharge Diagnoses:  Principal Problem:   Weakness generalized Active Problems:   MGUS (monoclonal gammopathy of unknown significance)   Diabetes mellitus type 1, controlled, with complications   Hypothyroidism   Multiple myeloma, without mention of having achieved remission(203.00)   End stage renal disease   Volume depletion   Discharge Condition: Good  Diet recommendation: Regular heart healthy renal  Filed Weights   07/25/12 1550 07/26/12 0449 07/27/12 0639  Weight: 86.6 kg (190 lb 14.7 oz) 85.866 kg (189 lb 4.8 oz) 86 kg (189 lb 9.5 oz)    History of present illness:  67 yr old male with ESRD [T/Th/Sat], DM, Htn, ?M Myeloma presented with weakness, n/v, Orthostasis and temp 101.4 post dialysis 07/23/12 and admitted. Work-up was + for [facility] with Gram + rods, and was c/o headache which is now somewhat better  Hospital Course:   1. ? Sepsis-2/2 to gram + Bacteremia? Infected subclavian line ?Diarrhea-reports 2-3 episodes at least a loose stools over past couple days. Patient's diarrhea seems to resolve on examination of the discharge was noted the patient had clear using his teeth. We vancomycin should cover for skin flora however I concerned for seeding line upper extremity and will need opthantogram versus further workup does show something a little bit more  exotic on as cultures have not finalized he as yet.  Discussed briefly with Dr. Drue Second who recommends Rpt cultures prior to d/c home and follow as OP-will have this fololwed by Nephrology 2. End-stage renal disease, T/Th/Sat-patient will be dialyzed per renal-He will receive vancomycin and Zosyn with dialysis and will need levels drawn at next dialysis.  3. Vomiting-resolved 4. MGUS- hemoglobin stable, platelets 122-chemotherapy Revlimid/Velcade/Dexamethasone apparently on hold pending second opinion from Dr. Marissa Calamity at [facility], as renal biopsy more consistent with a glomerulosclerosis rather than myeloma. Doubtful that this contributes to overall current issues 5. Slightly hyperthyroid-TSH 0.264-unclear how to interpret in the setting of acute illness, needs repeat in about 3-4 weeks. Would continue current dosage of medication 6. Diabetes mellitus- while in told was covered with sliding scale insulin. D5/1/2 NS d/c 7/12 blood sugars well controlled 130s 200's 7. Immobility-Get Pt/Ot to eval as felt somewhat dizzy/orthostatic s/p dialysis.    Consultants:  Nephrology Procedures:  CXR 7/11 neg for acute disease Antibiotics:  Vancomycin 7/10  Zosyn 7/10   Discharge Exam: Filed Vitals:   07/26/12 0449 07/26/12 1318 07/26/12 2100 07/27/12 0639  BP: 127/64 135/74 132/47 147/79  Pulse: 79 73 68 77  Temp: 99.6 F (37.6 C) 97.9 F (36.6 C) 98.2 F (36.8 C) 98.5 F (36.9 C)  TempSrc: Oral Oral Oral Oral  Resp: 18 18 16 18   Height:      Weight: 85.866 kg (189 lb 4.8 oz)   86 kg (189 lb 9.5 oz)  SpO2: 97% 100% 98% 98%   Alert no issues tol po well HeA resolved.  No n/v/cp sob Diarrhea has slowed  General: poor denittion, eomi, ncat, no pallor no ict Cardiovascular:  s1 s2 no m/r/g Respiratory: clear  Discharge Instructions  Discharge Orders   Future Appointments Provider Department Dept Phone   07/29/2012 9:00 AM Dava Najjar [facility]  [phone]   07/29/2012 9:30 AM Exie Parody, MD  [facility] [phone]   Future Orders Complete By Expires     Call MD for:  extreme fatigue  As directed     Call MD for:  hives  As directed     Call MD for:  redness, tenderness, or signs of infection (pain, swelling, redness, odor or green/yellow discharge around incision site)  As directed     Call MD for:  temperature >100.4  As directed     Diet - low sodium heart healthy  As directed     Discharge instructions  As directed     Comments:      Cultures of your blood sugar be available soon to determine sensitivity of the gram positive + bug that was in the blood-antibiotics need to be to adjust for this Get some laboratory work done when when you are at dialysis    Increase activity slowly  As directed         Medication List         amLODipine 10 MG tablet  Commonly known as:  NORVASC  Take 10 mg by mouth every morning.     aspirin EC 81 MG tablet  Take 81 mg by mouth daily.     calcitRIOL 0.25 MCG capsule  Commonly known as:  ROCALTROL  Take 0.25 mcg by mouth daily.     calcium acetate 667 MG capsule  Commonly known as:  PHOSLO  Take 1 capsule (667 mg total) by mouth 3 (three) times daily with meals.     doxazosin 2 MG tablet  Commonly known as:  CARDURA  Take 2 mg by mouth at bedtime.     levothyroxine 175 MCG tablet  Commonly known as:  SYNTHROID, LEVOTHROID  Take 175 mcg by mouth every morning.     multivitamin Tabs tablet  Take 1 tablet by mouth at bedtime.     ONGLYZA 5 MG Tabs tablet  Generic drug:  saxagliptin HCl  Take 5 mg by mouth daily.     Vancomycin 750 MG/150ML Soln  Commonly known as:  VANCOCIN  Inject 150 mLs (750 mg total) into the vein Every Tuesday,Thursday,and Saturday with dialysis.       Allergies  Allergen Reactions  . Ivp Dye (Iodinated Diagnostic Agents) Hives  . Morphine And Related Other (See Comments)    Bradycardia states patient      The  results of significant diagnostics from this hospitalization (including imaging, microbiology, ancillary and laboratory) are listed below for reference.    Significant Diagnostic Studies: Dg Chest 1 View  07/24/2012   *RADIOLOGY REPORT*  Clinical Data: Fever.  Dialysis patient.  CHEST - 1 VIEW  Comparison: 03/19/2012  Findings: Shallow inspiration with elevation of the right hemidiaphragm.  Stable appearance of right-sided central venous dialysis catheter.  Heart size and pulmonary vascularity are normal.  No focal consolidation or airspace disease.  No blunting of costophrenic angles.  No pneumothorax.  Mediastinal contours appear intact.  Degenerative changes in the spine and shoulders.  IMPRESSION: Stable appearance of the chest since previous study.  No evidence of active pulmonary disease.  Original Report Authenticated By: Burman Nieves, M.D.    Microbiology: Recent Results (from the past 240 hour(s))  CULTURE, BLOOD (ROUTINE X 2)     Status: None   Collection Time    07/24/12  6:05 AM      Result Value Range Status   Specimen Description BLOOD RIGHT ARM   Final   Special Requests BOTTLES DRAWN AEROBIC ONLY 10CC   Final   Culture  Setup Time 07/24/2012 13:58   Final   Culture     Final   Value: GRAM POSITIVE RODS     Note: Gram Stain Report Called to,Read Back By and Verified With: PEGGY GERHARD 07/26/12 @ 10:11AM BY RUSCOE A.   Report Status PENDING   Incomplete  CULTURE, BLOOD (ROUTINE X 2)     Status: None   Collection Time    07/24/12  6:10 AM      Result Value Range Status   Specimen Description BLOOD LEFT ARM   Final   Special Requests BOTTLES DRAWN AEROBIC ONLY 10CC   Final   Culture  Setup Time 07/24/2012 14:01   Final   Culture     Final   Value: GRAM POSITIVE RODS     Note: Gram Stain Report Called to,Read Back By and Verified With: PEGGY GERHARD 07/26/12 @ 10:11AM BY RUSCOE A.   Report Status PENDING   Incomplete  CATH TIP CULTURE     Status: None   Collection Time     07/26/12  2:43 PM      Result Value Range Status   Specimen Description CATH TIP   Final   Special Requests Immunocompromised   Final   Culture NO GROWTH   Final   Report Status PENDING   Incomplete     Labs: Basic Metabolic Panel:  Recent Labs Lab 07/23/12 2328 07/24/12 0920 07/25/12 0520  NA 138  --  136  K 3.3*  --  3.4*  CL 96  --  95*  CO2  --   --  30  GLUCOSE 199*  --  149*  BUN 13  --  27*  CREATININE 5.00* 6.13* 7.41*  CALCIUM  --   --  8.8  PHOS  --   --  4.4   Liver Function Tests:  Recent Labs Lab 07/25/12 0520  ALBUMIN 3.0*   No results found for this basename: LIPASE, AMYLASE,  in the last 168 hours No results found for this basename: AMMONIA,  in the last 168 hours CBC:  Recent Labs Lab 07/23/12 2313 07/23/12 2328 07/24/12 0920 07/25/12 0520 07/26/12 0445  WBC 10.2  --  8.2 9.0 10.4  NEUTROABS 9.0*  --   --  6.3  --   HGB 11.4* 12.2* 11.0* 10.8* 10.6*  HCT 34.7* 36.0* 35.4* 34.3* 33.5*  MCV 87.2  --  88.9 88.9 88.9  PLT 134*  --  122* 123* 124*   Cardiac Enzymes: No results found for this basename: CKTOTAL, CKMB, CKMBINDEX, TROPONINI,  in the last 168 hours BNP: BNP (last 3 results)  Recent Labs  03/18/12 0940  PROBNP 12648.0*   CBG:  Recent Labs Lab 07/26/12 1059 07/26/12 1639 07/26/12 2117 07/27/12 0551 07/27/12 1102  GLUCAP 137* 121* 164* 109* 209*       Signed:  Rhetta Mura  [facility] 07/27/2012, 1:11 PM

## 2012-07-27 NOTE — Progress Notes (Signed)
Subjective: Feels good, no complaints  Objective Filed Vitals:   07/26/12 0449 07/26/12 1318 07/26/12 2100 07/27/12 0639  BP: 127/64 135/74 132/47 147/79  Pulse: 79 73 68 77  Temp: 99.6 F (37.6 C) 97.9 F (36.6 C) 98.2 F (36.8 C) 98.5 F (36.9 C)  TempSrc: Oral Oral Oral Oral  Resp: 18 18 16 18   Height:      Weight: 85.866 kg (189 lb 4.8 oz)   86 kg (189 lb 9.5 oz)  SpO2: 97% 100% 98% 98%    CBC:  Recent Labs Lab 07/23/12 2313  07/24/12 0920 07/25/12 0520 07/26/12 0445  WBC 10.2  --  8.2 9.0 10.4  NEUTROABS 9.0*  --   --  6.3  --   HGB 11.4*  < > 11.0* 10.8* 10.6*  HCT 34.7*  < > 35.4* 34.3* 33.5*  MCV 87.2  --  88.9 88.9 88.9  PLT 134*  --  122* 123* 124*  < > = values in this interval not displayed. Basic Metabolic Panel / Renal Panel:  Recent Labs Lab 07/23/12 2328 07/24/12 0920 07/25/12 0520  NA 138  --  136  K 3.3*  --  3.4*  CL 96  --  95*  CO2  --   --  30  GLUCOSE 199*  --  149*  BUN 13  --  27*  CREATININE 5.00* 6.13* 7.41*  CALCIUM  --   --  8.8  PHOS  --   --  4.4    Physical Exam:  Blood pressure 147/79, pulse 77, temperature 98.5 F (36.9 C), temperature source Oral, resp. rate 18, height 6\' 1"  (1.854 m), weight 86 kg (189 lb 9.5 oz), SpO2 98.00%. Gen: Alert, no distress  Resp: Clear on L, dec'd R base Cardio: RRR with Gr II/VI murmur, no rub  GI: + BS, soft and nontender  Extremities: No edema  Access: R IJ catheter out, no drainage from exit wound, AVF @ LUA with + strong bruit and good tone  Outpatient HD (TTS [facility]) LUA AVF ?? other  Assessment 1 GPR sepsis, s/p HD cath removal- clinically improved, suspect cath-related sepsis 2 ESRD TTS HD- will use AVF from here, the access looks good 3 HTN- alpha blocker 4 Anemia of CKD- hold ESA, Hb good 5 Sec HPT, cont vit D and phoslo 6 Nutrition - Alb 3 7 Access- cath is out, try AVF with 2 needles 8 Hx MGUS- s/p chemo, renal bx 5/15 neg for myeloma 9 DM2  Plan- OK for discharge from  renal standpoint, use AVF for HD from now forward, will continue IV vanc with OP dialysis and we will f/u final results of blood cultures   Vinson Moselle  MD Pager [phone]    Cell  [phone] 07/27/2012, 8:19 AM

## 2012-07-27 NOTE — Progress Notes (Signed)
ANTIBIOTIC CONSULT NOTE   Pharmacy Consult for Vancomycin and Zosyn  Indication: sepsis  Allergies  Allergen Reactions  . Ivp Dye (Iodinated Diagnostic Agents) Hives  . Morphine And Related Other (See Comments)    Bradycardia states patient   Labs:  Recent Labs  07/25/12 0520 07/26/12 0445  WBC 9.0 10.4  HGB 10.8* 10.6*  PLT 123* 124*  CREATININE 7.41*  --    Estimated Creatinine Clearance: 10.8 ml/min (by C-G formula based on Cr of 7.41). No results found for this basename: VANCOTROUGH, Leodis Binet, VANCORANDOM, GENTTROUGH, GENTPEAK, GENTRANDOM, TOBRATROUGH, TOBRAPEAK, TOBRARND, AMIKACINPEAK, AMIKACINTROU, AMIKACIN,  in the last 72 hours   Microbiology: Recent Results (from the past 720 hour(s))  CULTURE, BLOOD (ROUTINE X 2)     Status: None   Collection Time    07/24/12  6:05 AM      Result Value Range Status   Specimen Description BLOOD RIGHT ARM   Final   Special Requests BOTTLES DRAWN AEROBIC ONLY 10CC   Final   Culture  Setup Time 07/24/2012 13:58   Final   Culture     Final   Value: GRAM POSITIVE RODS     Note: Gram Stain Report Called to,Read Back By and Verified With: PEGGY GERHARD 07/26/12 @ 10:11AM BY RUSCOE A.   Report Status PENDING   Incomplete  CULTURE, BLOOD (ROUTINE X 2)     Status: None   Collection Time    07/24/12  6:10 AM      Result Value Range Status   Specimen Description BLOOD LEFT ARM   Final   Special Requests BOTTLES DRAWN AEROBIC ONLY 10CC   Final   Culture  Setup Time 07/24/2012 14:01   Final   Culture     Final   Value: GRAM POSITIVE RODS     Note: Gram Stain Report Called to,Read Back By and Verified With: PEGGY GERHARD 07/26/12 @ 10:11AM BY RUSCOE A.   Report Status PENDING   Incomplete  CATH TIP CULTURE     Status: None   Collection Time    07/26/12  2:43 PM      Result Value Range Status   Specimen Description CATH TIP   Final   Special Requests Immunocompromised   Final   Culture NO GROWTH   Final   Report Status PENDING    Incomplete    Assessment: 67 yo male with ESRD presented with fevers, nausea.  Also, with multiple myeloma.   Currently afebrile, WBC=10.4.  Blood cultures positive for GPR  Goal of Therapy:  Vancomycin pre-HD level 15-25  Plan:  1) Continue vancomycin 750 mg iv Q HD 2) Zosyn 2.25 grams iv Q 8 hours 3) Continue to follow  Thank you. Okey Regal, PharmD [phone]  07/27/2012,10:14 AM

## 2012-07-27 NOTE — Evaluation (Signed)
Occupational Therapy Evaluation and Discharge Patient Details Name: Jack [name] MRN: [id number] DOB: October 26, 1945 Today's Date: 07/27/2012 Time: 4098-1191 OT Time Calculation (min): 17 min  OT Assessment / Plan / Recommendation History of present illness Jack [name] is an 67 y.o. male with ESRD on HD (Tu Thrs Sat), DM, HTN, multiple myeloma, brought in by daughter because he has been weak since his dialysis yesterday.  Apparently he was having a long dialysis, and has been feeling weak since the dialysis   Clinical Impression   Pt admitted with above. Pt at an Mod I level and no OT needs identified. Acute OT will sign off.      OT Assessment  Patient does not need any further OT services    Follow Up Recommendations  No OT follow up       Equipment Recommendations  None recommended by OT          Precautions / Restrictions Precautions Precautions: None Restrictions Weight Bearing Restrictions: No       ADL  Equipment Used: Rolling walker Transfers/Ambulation Related to ADLs: Mod I with RW in room and down hallway.  ADL Comments: Mod I for all BADLs        Visit Information  Last OT Received On: 07/27/12 Assistance Needed: +1 History of Present Illness: Jack [name] is an 67 y.o. male with ESRD on HD (Tu Thrs Sat), DM, HTN, multiple myeloma, brought in by daughter because he has been weak since his dialysis yesterday.  Apparently he was having a long dialysis, and has been feeling weak since the dialysis       Prior Functioning     Home Living Family/patient expects to be discharged to:: Private residence Living Arrangements: Children Available Help at Discharge: Family;Available 24 hours/day Type of Home: Apartment Home Access: Level entry Home Layout: One level Home Equipment: Cane - single point;Walker - 2 wheels Prior Function Level of Independence: Independent with assistive device(s) Comments: Uses nothing v. SPC v. RW depending on  how he feels that day or time Communication Communication: No difficulties Dominant Hand: Right         Vision/Perception Vision - History Baseline Vision: Wears glasses all the time (bifocals) Patient Visual Report: No change from baseline   Cognition  Cognition Arousal/Alertness: Awake/alert Behavior During Therapy: WFL for tasks assessed/performed Overall Cognitive Status: Within Functional Limits for tasks assessed    Extremity/Trunk Assessment Upper Extremity Assessment Upper Extremity Assessment: Overall WFL for tasks assessed     Mobility Bed Mobility Details for Bed Mobility Assistance: Independent Transfers Transfers: Stand to Sit;Sit to Stand Details for Transfer Assistance: Mod, did have to VC him for controlling stand to sit and why this is important for safety and strength           End of Session OT - End of Session Equipment Utilized During Treatment: Rolling walker Activity Tolerance: Patient tolerated treatment well Patient left: in chair;with family/visitor present;with call bell/phone within reach    Evette Georges [phone] 07/27/2012, 9:31 AM

## 2012-07-27 NOTE — Progress Notes (Signed)
Pt d/c home w/family. D/c instructions and medications reviewed with pt and family. Pt and family state understanding. All questions answered. [facility] call vaco IV set up for pt to get doing HD

## 2012-07-27 NOTE — Discharge Summary (Signed)
Rhetta Mura, MD Physician Incomplete Internal Medicine Progress Notes Service date: 07/27/2012 1:11 PM  Physician Discharge Summary    LAYMOND POSTLE EAV:[id number] DOB: 1945-12-20 DOA: 07/23/2012   PCP: Geraldo Pitter, MD   Admit date: 07/23/2012 Discharge date: 07/27/2012   Time spent: 35 minutes   Recommendations for Outpatient Follow-up:    Vancomycin trough and peak levels in about a week Recommend outpatient TSH again in about 3 as he seemed to have some slight thyroidism unclear how to interpret in the setting of acute illness Recommend renal panel in about a week Follow up with oncologist for further discussion about potential MGUS Monitor for fevers chills and diarrhea     Discharge Diagnoses:   Principal Problem:   Weakness generalized Active Problems:   MGUS (monoclonal gammopathy of unknown significance)   Diabetes mellitus type 1, controlled, with complications   Hypothyroidism   Multiple myeloma, without mention of having achieved remission(203.00)   End stage renal disease   Volume depletion   Discharge Condition: Good   Diet recommendation: Regular heart healthy renal    Filed Weights     07/25/12 1550  07/26/12 0449  07/27/12 0639   Weight:  86.6 kg (190 lb 14.7 oz)  85.866 kg (189 lb 4.8 oz)  86 kg (189 lb 9.5 oz)      History of present illness:   67 yr old male with ESRD [T/Th/Sat], DM, Htn, ?M Myeloma presented with weakness, n/v, Orthostasis and temp 101.4 post dialysis 07/23/12 and admitted. Work-up was + for [facility] with Gram + rods, and was c/o headache which is now somewhat better   Hospital Course:     1. ? Sepsis-2/2 to gram + Bacteremia? Infected subclavian line ?Diarrhea-reports 2-3 episodes at least a loose stools over past couple days. Patient's diarrhea seems to resolve on examination of the discharge was noted the patient had clear using his teeth. We vancomycin should cover for skin flora however I am concerned that he may the  seated line upper extremity and will need opthantogram versus further workup does show something a little bit more exotic on as cultures have not finalized. 2. End-stage renal disease, T/Th/Sat-patient will be dialyzed per renal-He will receive vancomycin and Zosyn with dialysis and will need levels drawn at next dialysis.   3. Vomiting-resolved 4. MGUS- hemoglobin stable, platelets 122-chemotherapy Revlimid/Velcade/Dexamethasone apparently on hold pending second opinion from Dr. Marissa Calamity at [facility], as renal biopsy more consistent with a glomerulosclerosis rather than myeloma. Doubtful that this contributes to overall current issues 5. Slightly hyperthyroid-TSH 0.264-unclear how to interpret in the setting of acute illness, needs repeat in about 3-4 weeks. Would continue current dosage of medication 6. Diabetes mellitus- while in told was covered with sliding scale insulin. D5/1/2 NS d/c 7/12 blood sugars well controlled 130s 200's 7. Immobility-Get Pt/Ot to eval as felt somewhat dizzy/orthostatic s/p dialysis.   Consultants:  Nephrology Procedures:  CXR 7/11 neg for acute disease Antibiotics:  Vancomycin 7/10   Zosyn 7/10     Discharge Exam: Filed Vitals:     07/26/12 0449  07/26/12 1318  07/26/12 2100  07/27/12 0639   BP:  127/64  135/74  132/47  147/79   Pulse:  79  73  68  77   Temp:  99.6 F (37.6 C)  97.9 F (36.6 C)  98.2 F (36.8 C)  98.5 F (36.9 C)   TempSrc:  Oral  Oral  Oral  Oral   Resp:  18  18  16  18   Height:           Weight:  85.866 kg (189 lb 4.8 oz)      86 kg (189 lb 9.5 oz)   SpO2:  97%  100%  98%  98%        Discharge Instructions    Discharge Orders     Future Appointments  Provider  Department  Dept Phone     07/29/2012 9:00 AM  Dava Najjar Idelle Jo  [facility]  [phone]     07/29/2012 9:30 AM  Exie Parody, MD  [place] [facility]  [phone]     Future Orders  Complete By  Expires        Call MD  for:  extreme fatigue   As directed         Call MD for:  hives   As directed         Call MD for:  redness, tenderness, or signs of infection (pain, swelling, redness, odor or green/yellow discharge around incision site)   As directed         Call MD for:  temperature >100.4   As directed         Diet - low sodium heart healthy   As directed         Discharge instructions   As directed         Comments:         Cultures of your blood sugar be available soon to determine sensitivity of the gram positive + bug that was in the blood-antibiotics need to be to adjust for this Get some laboratory work done when when you are at dialysis       Increase activity slowly   As directed              Medication List              amLODipine 10 MG tablet   Commonly known as:  NORVASC   Take 10 mg by mouth every morning.         aspirin EC 81 MG tablet   Take 81 mg by mouth daily.         calcitRIOL 0.25 MCG capsule   Commonly known as:  ROCALTROL   Take 0.25 mcg by mouth daily.         calcium acetate 667 MG capsule   Commonly known as:  PHOSLO   Take 1 capsule (667 mg total) by mouth 3 (three) times daily with meals.         doxazosin 2 MG tablet   Commonly known as:  CARDURA   Take 2 mg by mouth at bedtime.         levothyroxine 175 MCG tablet   Commonly known as:  SYNTHROID, LEVOTHROID   Take 175 mcg by mouth every morning.         multivitamin Tabs tablet   Take 1 tablet by mouth at bedtime.         ONGLYZA 5 MG Tabs tablet   Generic drug:  saxagliptin HCl   Take 5 mg by mouth daily.         Vancomycin 750 MG/150ML Soln   Commonly known as:  VANCOCIN   Inject 150 mLs (750 mg total) into the vein Every Tuesday,Thursday,and Saturday with dialysis.           Allergies   Allergen  Reactions   .  Ivp Dye (Iodinated Diagnostic Agents)  Hives   .  Morphine And Related  Other (See Comments)       Bradycardia states patient        --------------------------------------------------------------------------------   The results of significant diagnostics from this hospitalization (including imaging, microbiology, ancillary and laboratory) are listed below for reference.       Significant Diagnostic Studies: Dg Chest 1 View   07/24/2012   *RADIOLOGY REPORT*  Clinical Data: Fever.  Dialysis patient.  CHEST - 1 VIEW  Comparison: 03/19/2012  Findings: Shallow inspiration with elevation of the right hemidiaphragm.  Stable appearance of right-sided central venous dialysis catheter.  Heart size and pulmonary vascularity are normal.  No focal consolidation or airspace disease.  No blunting of costophrenic angles.  No pneumothorax.  Mediastinal contours appear intact.  Degenerative changes in the spine and shoulders.  IMPRESSION: Stable appearance of the chest since previous study.  No evidence of active pulmonary disease.   Original Report Authenticated By: Burman Nieves, M.D.      Microbiology: Recent Results (from the past 240 hour(s))   CULTURE, BLOOD (ROUTINE X 2)     Status: None     Collection Time      07/24/12  6:05 AM       Result  Value  Range  Status     Specimen Description  BLOOD RIGHT ARM     Final     Special Requests  BOTTLES DRAWN AEROBIC ONLY 10CC     Final     Culture  Setup Time  07/24/2012 13:58     Final     Culture        Final     Value:  GRAM POSITIVE RODS        Note: Gram Stain Report Called to,Read Back By and Verified With: PEGGY GERHARD 07/26/12 @ 10:11AM BY RUSCOE A.     Report Status  PENDING     Incomplete   CULTURE, BLOOD (ROUTINE X 2)     Status: None     Collection Time      07/24/12  6:10 AM       Result  Value  Range  Status     Specimen Description  BLOOD LEFT ARM     Final     Special Requests  BOTTLES DRAWN AEROBIC ONLY 10CC     Final     Culture  Setup Time  07/24/2012 14:01     Final     Culture        Final     Value:  GRAM POSITIVE RODS        Note: Gram Stain  Report Called to,Read Back By and Verified With: PEGGY GERHARD 07/26/12 @ 10:11AM BY RUSCOE A.     Report Status  PENDING     Incomplete   CATH TIP CULTURE     Status: None     Collection Time      07/26/12  2:43 PM       Result  Value  Range  Status     Specimen Description  CATH TIP     Final     Special Requests  Immunocompromised     Final     Culture  NO GROWTH     Final     Report Status  PENDING     Incomplete      Labs: Basic Metabolic Panel: Recent Labs Lab  07/23/12 2328  07/24/12 0920  07/25/12 1610  NA  138   --   136   K  3.3*   --   3.4*   CL  96   --   95*   CO2   --    --   30   GLUCOSE  199*   --   149*   BUN  13   --   27*   CREATININE  5.00*  6.13*  7.41*   CALCIUM   --    --   8.8   PHOS   --    --   4.4    Liver Function Tests: Recent Labs Lab  07/25/12 0520   ALBUMIN  3.0*    No results found for this basename: LIPASE, AMYLASE,  in the last 168 hours No results found for this basename: AMMONIA,  in the last 168 hours CBC: Recent Labs Lab  07/23/12 2313  07/23/12 2328  07/24/12 0920  07/25/12 0520  07/26/12 0445   WBC  10.2   --   8.2  9.0  10.4   NEUTROABS  9.0*   --    --   6.3   --    HGB  11.4*  12.2*  11.0*  10.8*  10.6*   HCT  34.7*  36.0*  35.4*  34.3*  33.5*   MCV  87.2   --   88.9  88.9  88.9   PLT  134*   --   122*  123*  124*    Cardiac Enzymes: No results found for this basename: CKTOTAL, CKMB, CKMBINDEX, TROPONINI,  in the last 168 hours BNP: BNP (last 3 results) Recent Labs   03/18/12 0940   PROBNP  12648.0*    CBG: Recent Labs Lab  07/26/12 1059  07/26/12 1639  07/26/12 2117  07/27/12 0551  07/27/12 1102   GLUCAP  137*  121*  164*  109*  209*            Signed:   Rhetta Mura        [facility] 07/27/2012, 1:11 PM

## 2012-07-29 ENCOUNTER — Ambulatory Visit: Payer: Medicare Other

## 2012-07-29 ENCOUNTER — Other Ambulatory Visit: Payer: Medicare Other | Admitting: Lab

## 2012-07-29 ENCOUNTER — Ambulatory Visit (HOSPITAL_BASED_OUTPATIENT_CLINIC_OR_DEPARTMENT_OTHER): Payer: Medicare Other | Admitting: Oncology

## 2012-07-29 ENCOUNTER — Other Ambulatory Visit (HOSPITAL_BASED_OUTPATIENT_CLINIC_OR_DEPARTMENT_OTHER): Payer: Medicare Other | Admitting: Lab

## 2012-07-29 ENCOUNTER — Telehealth: Payer: Self-pay | Admitting: Oncology

## 2012-07-29 VITALS — BP 161/84 | HR 89 | Temp 98.0°F | Resp 18 | Ht 73.0 in | Wt 190.9 lb

## 2012-07-29 DIAGNOSIS — D472 Monoclonal gammopathy: Secondary | ICD-10-CM

## 2012-07-29 DIAGNOSIS — N185 Chronic kidney disease, stage 5: Secondary | ICD-10-CM

## 2012-07-29 LAB — CBC WITH DIFFERENTIAL/PLATELET
Basophils Absolute: 0.1 10*3/uL (ref 0.0–0.1)
Eosinophils Absolute: 0.2 10*3/uL (ref 0.0–0.5)
HGB: 11.9 g/dL — ABNORMAL LOW (ref 13.0–17.1)
LYMPH%: 25.3 % (ref 14.0–49.0)
MCV: 87.3 fL (ref 79.3–98.0)
MONO%: 10.8 % (ref 0.0–14.0)
NEUT#: 3 10*3/uL (ref 1.5–6.5)
NEUT%: 58.8 % (ref 39.0–75.0)
Platelets: 196 10*3/uL (ref 140–400)

## 2012-07-29 LAB — COMPREHENSIVE METABOLIC PANEL (CC13)
ALT: 21 U/L (ref 0–55)
CO2: 25 mEq/L (ref 22–29)
Calcium: 9.8 mg/dL (ref 8.4–10.4)
Chloride: 99 mEq/L (ref 98–109)
Creatinine: 8.3 mg/dL (ref 0.7–1.3)

## 2012-07-29 NOTE — Telephone Encounter (Signed)
Gave pt appt for lab and MD on September 2014 °

## 2012-07-29 NOTE — Progress Notes (Signed)
Cancer Center  [phone] [phone]   OFFICE PROGRESS NOTE   Cc:  Jack Pitter, MD  DIAGNOSIS:  MGUS progression to IgG kappa multiple myeloma; presenting with anemia, ESRD.  Bone marrow biopsy on 03/23/2012 showed 12% plasma cell.  FISH and cytogenetics are pending.   PAST THERAPY:  Was on chemo Revlimid/Velcade/Dexamethasone on 3/37/2014. Chemo therapy was aborted when renal biopsy showed no sign of renal involvement and 2nd opinion at [facility] recommended to go off of chemo.   INTERVAL HISTORY: PER BEAGLEY 67 y.o. male returns for regular follow up with his daughter.  He thinks that his stamina is now about 80% of his baseline level about 2 years ago.  He has been tolerating hemodialysis well with only occasional hypotension.  He is able to ambulate without the use of wheel chair.  He still needs a walker and cane just in case.  He denied any recent fall. His bilateral pedal edema has resolved. He denies fever, anorexia, weight loss, headache, visual changes, confusion, drenching night sweats, palpable lymph node swelling, mucositis, odynophagia, dysphagia, nausea vomiting, jaundice, chest pain, palpitation, shortness of breath, dyspnea on exertion, productive cough, gum bleeding, epistaxis, hematemesis, hemoptysis, abdominal pain, abdominal swelling, early satiety, melena, hematochezia, hematuria, skin rash, spontaneous bleeding, joint swelling, joint pain, heat or cold intolerance, bowel bladder incontinence, back pain, focal motor weakness, paresthesia, depression.       Past Medical History  Diagnosis Date  . Hypertension   . Diabetes mellitus without complication   . Shortness of breath   . Pneumonia     2012  . Heart murmur   . Glaucoma   . Multiple myeloma, without mention of having achieved remission(203.00) 03/30/2012    Cytogenetic neg on 03/23/2012.  . End stage renal disease on dialysis   . Asthma     Past Surgical History    Procedure Laterality Date  . Thyroidectomy    . Cervical disc surgery    . Eye surgery      CATARACTS  . Insertion of dialysis catheter Right 03/19/2012    Procedure: INSERTION OF DIALYSIS CATHETER;  Surgeon: Pryor Ochoa, MD;  Location: [facility] OR;  Service: Vascular;  Laterality: Right;  Right Internal Jugular  . Av fistula placement Left 03/25/2012    Procedure: ARTERIOVENOUS (AV) FISTULA CREATION;  Surgeon: Pryor Ochoa, MD;  Location: [facility] OR;  Service: Vascular;  Laterality: Left;  . Ligation of competing branches of arteriovenous fistula Left 05/08/2012    Procedure: LIGATION OF COMPETING BRANCHES OF ARTERIOVENOUS FISTULA;  Surgeon: Pryor Ochoa, MD;  Location: [facility] OR;  Service: Vascular;  Laterality: Left;  Ultrasound guided    Current Outpatient Prescriptions  Medication Sig Dispense Refill  . amLODipine (NORVASC) 10 MG tablet Take 10 mg by mouth every morning.      Marland Kitchen aspirin EC 81 MG tablet Take 81 mg by mouth daily.      . calcitRIOL (ROCALTROL) 0.25 MCG capsule Take 0.25 mcg by mouth daily.      . calcium acetate (PHOSLO) 667 MG capsule Take 1 capsule (667 mg total) by mouth 3 (three) times daily with meals.  9 capsule  0  . doxazosin (CARDURA) 2 MG tablet Take 2 mg by mouth at bedtime.      Marland Kitchen levothyroxine (SYNTHROID, LEVOTHROID) 175 MCG tablet Take 175 mcg by mouth every morning.       . multivitamin (RENA-VIT) TABS tablet Take 1 tablet by mouth at bedtime.  30 tablet  0  . saxagliptin HCl (ONGLYZA) 5 MG TABS tablet Take 5 mg by mouth daily.      . Vancomycin (VANCOCIN) 750 MG/150ML SOLN Inject 150 mLs (750 mg total) into the vein Every Tuesday,Thursday,and Saturday with dialysis.  150 mL  4   No current facility-administered medications for this visit.    ALLERGIES:  is allergic to ivp dye and morphine and related.   Filed Vitals:   07/29/12 0930  BP: 161/84  Pulse: 89  Temp: 98 F (36.7 C)  Resp: 18   Wt Readings from Last 3 Encounters:  07/29/12 190 lb 14.4 oz  (86.592 kg)  07/27/12 189 lb 9.5 oz (86 kg)  06/30/12 199 lb (90.266 kg)   ECOG Performance status: 1-2  PHYSICAL EXAMINATION:   General:  well-nourished man, tired appearing, in no acute distress.  Eyes:  no scleral icterus.  ENT:  There were no oropharyngeal lesions.  Neck was without thyromegaly.  Lymphatics:  Negative cervical, supraclavicular or axillary adenopathy.  Respiratory: lungs were clear bilaterally without wheezing or crackles.  Cardiovascular:  Regular rate and rhythm, S1/S2, without murmur, rub or gallop.  There was no pedal edema.  GI:  abdomen was soft, flat, nontender, nondistended, without organomegaly.  Muscoloskeletal:  no spinal tenderness of palpation of vertebral spine.  Skin exam was without echymosis, petichae.  Neuro exam was nonfocal.  Patient was alert and oriented.  Attention was good.   Language was appropriate.  Mood was normal without depression.  Speech was not pressured.  Thought content was not tangential.  He was able to get on exam table without assistance.     LABORATORY/RADIOLOGY DATA:  Lab Results  Component Value Date   WBC 5.0 07/29/2012   HGB 11.9* 07/29/2012   HCT 36.7* 07/29/2012   PLT 196 07/29/2012   GLUCOSE 177* 07/29/2012   ALKPHOS 76 07/29/2012   ALT 21 07/29/2012   AST 18 07/29/2012   NA 141 07/29/2012   K 3.6 07/29/2012   CL 95* 07/25/2012   CREATININE 8.3 Repeated and Verified* 07/29/2012   BUN 26.3* 07/29/2012   CO2 25 07/29/2012   PSA 9.32* 01/28/2011   INR 3.10* 06/26/2012   HGBA1C 8.8* 03/19/2012      ASSESSMENT AND PLAN:   1.  Diagnosis:  MGUS with sudden development of acute renal failure requiring dialysis.  - Bone marrow biopsy showed 12% plasma cell.  Combined with ESRD, it was my impression that he had progression to active myeloma and started him on chemo Revlimid/Velcade/Dex.  However, 2nd opinion at [facility] (whom he was seeing also for eventual BMT) did not think that he was having active disease (due to low M-spike and light  chain).  They recommended renal biopsy which was done after about 1 month of chemo.  There was no evidence of myeloma involvement on the kidney biopsy.  It is difficult to decide whether he had no renal involvement to begin with meaning he did not have myeloma and only had MGUS.  The other alternative explanation was that the one cycle of chemo resolved his renal myeloma.    - I recommended evaluation by [facility] myeloma clinic for 3rd opinion.  For now, his chemo has been on hold. He canceled the appointment last week due to being hospitalized for dialysis catheter infection. He will reschedule with [facility].   - He is stable off of chemo.  There is no evidence of rapidly progressing disease.    2.  Deconditioning:  Improved with PT.   3.  ESRD:  On dialysis on Tue/Thu/Sat.   4.  Diabetes mellitus; type II:  He is on saxagliptin per PCP.   5.  HTN:  He is on Amlodipine.   6.  Hypothyroidism:  He is on Levothyroxine.   I informed Mr. Bellerose and his daughter that I am leaving the practice.  [facility] will [facility] for him to see another provider when he returns.     The length of time of the face-to-face encounter was 10  minutes. More than 50% of time was spent counseling and coordination of care.    Huan T. Gaylyn Rong, M.D.

## 2012-07-30 ENCOUNTER — Telehealth (HOSPITAL_COMMUNITY): Payer: Self-pay | Admitting: Emergency Medicine

## 2012-07-30 LAB — CATH TIP CULTURE: Culture: 60

## 2012-07-31 ENCOUNTER — Other Ambulatory Visit: Payer: Self-pay | Admitting: Oncology

## 2012-07-31 LAB — PROTEIN ELECTROPHORESIS, SERUM
Beta 2: 6.7 % — ABNORMAL HIGH (ref 3.2–6.5)
Beta Globulin: 4 % — ABNORMAL LOW (ref 4.7–7.2)
M-Spike, %: 0.27 g/dL

## 2012-07-31 LAB — KAPPA/LAMBDA LIGHT CHAINS: Kappa:Lambda Ratio: 1.74 — ABNORMAL HIGH (ref 0.26–1.65)

## 2012-07-31 LAB — CULTURE, BLOOD (ROUTINE X 2)

## 2012-08-02 LAB — CULTURE, BLOOD (ROUTINE X 2)

## 2012-08-05 ENCOUNTER — Ambulatory Visit: Payer: Medicare Other

## 2012-08-05 ENCOUNTER — Other Ambulatory Visit: Payer: Medicare Other | Admitting: Lab

## 2012-08-12 ENCOUNTER — Encounter: Payer: Self-pay | Admitting: Oncology

## 2012-08-12 ENCOUNTER — Other Ambulatory Visit: Payer: Medicare Other | Admitting: Lab

## 2012-08-12 ENCOUNTER — Ambulatory Visit: Payer: Medicare Other

## 2012-08-12 NOTE — Progress Notes (Signed)
Received 60 day letter from [facility] wanting to know if pt still needed assistance with Revlimed.  Per Dr. Lodema Pilot notes pt is no longer on chemo.  I called [facility] and made them aware of this.

## 2012-08-19 ENCOUNTER — Other Ambulatory Visit: Payer: Self-pay

## 2012-09-28 ENCOUNTER — Other Ambulatory Visit: Payer: Self-pay | Admitting: Hematology and Oncology

## 2012-09-28 DIAGNOSIS — C9000 Multiple myeloma not having achieved remission: Secondary | ICD-10-CM

## 2012-09-29 ENCOUNTER — Ambulatory Visit (HOSPITAL_BASED_OUTPATIENT_CLINIC_OR_DEPARTMENT_OTHER): Payer: Medicare Other | Admitting: Hematology and Oncology

## 2012-09-29 ENCOUNTER — Encounter: Payer: Self-pay | Admitting: Hematology and Oncology

## 2012-09-29 ENCOUNTER — Other Ambulatory Visit (HOSPITAL_BASED_OUTPATIENT_CLINIC_OR_DEPARTMENT_OTHER): Payer: Medicare Other | Admitting: Lab

## 2012-09-29 ENCOUNTER — Telehealth: Payer: Self-pay | Admitting: Hematology and Oncology

## 2012-09-29 VITALS — BP 137/70 | HR 56 | Temp 97.9°F | Resp 19 | Ht 73.0 in | Wt 198.1 lb

## 2012-09-29 DIAGNOSIS — D472 Monoclonal gammopathy: Secondary | ICD-10-CM

## 2012-09-29 DIAGNOSIS — D638 Anemia in other chronic diseases classified elsewhere: Secondary | ICD-10-CM

## 2012-09-29 DIAGNOSIS — C9000 Multiple myeloma not having achieved remission: Secondary | ICD-10-CM

## 2012-09-29 DIAGNOSIS — N186 End stage renal disease: Secondary | ICD-10-CM

## 2012-09-29 LAB — COMPREHENSIVE METABOLIC PANEL (CC13)
ALT: 15 U/L (ref 0–55)
AST: 13 U/L (ref 5–34)
Alkaline Phosphatase: 76 U/L (ref 40–150)
Calcium: 9.3 mg/dL (ref 8.4–10.4)
Chloride: 95 mEq/L — ABNORMAL LOW (ref 98–109)
Creatinine: 7.3 mg/dL (ref 0.7–1.3)
Potassium: 4 mEq/L (ref 3.5–5.1)

## 2012-09-29 LAB — CBC WITH DIFFERENTIAL/PLATELET
BASO%: 1.3 % (ref 0.0–2.0)
EOS%: 2.2 % (ref 0.0–7.0)
MCH: 27.5 pg (ref 27.2–33.4)
MCHC: 32 g/dL (ref 32.0–36.0)
NEUT%: 62.4 % (ref 39.0–75.0)
RDW: 15 % — ABNORMAL HIGH (ref 11.0–14.6)
lymph#: 1.1 10*3/uL (ref 0.9–3.3)

## 2012-09-29 NOTE — Progress Notes (Signed)
[facility] OFFICE PROGRESS NOTE  Geraldo Pitter, MD [phone] [address] [place] [address]  DIAGNOSIS: MGUS  SUMMARY OF HEMATOLOGIC HISTORY: I have reviewed the patient's medical history extensively. This pleasant 67 year old gentleman, was found to have anemia and renal failure requiring dialysis he was in March of 2014 he underwent a bone marrow aspirate and biopsy which showed 12% plasma cell involvement. The patient was started on Revlimid/Velcade/dexamethasone. He received a second opinion in [facility] and was recommended not to undergo further treatment. In May of 2014, he underwent a kidney biopsy, which showed diabetic glomerulosclerosis. INTERVAL HISTORY: HARLIE RAGLE 67 y.o. male returns for followup on diagnosis of monoclonal of unknown significance, kappa subtype. The patient has not received any chemotherapy since March of this year. He went to [facility] in [place] and had a third opinion recently, and was recommended observation. He denies any recent fevers, chills, night sweats, or new areas of bone pain. His appetite is excellent and he denies any recent weight loss. He is currently undergoing dialysis with his nephrologist 3 times a week on Mondays Wednesdays and Fridays.  I have reviewed the past medical history, past surgical history, social history and family history with the patient and they are unchanged from previous note.  ALLERGIES:  is allergic to ivp dye and morphine and related.  MEDICATIONS: has a current medication list which includes the following prescription(s): amlodipine, aspirin ec, bystolic, calcitriol, calcium acetate, doxazosin, gabapentin, levothyroxine, multivitamin, and saxagliptin hcl.   REVIEW OF SYSTEMS:   Constitutional: Denies fevers, chills or abnormal weight loss Eyes: Denies blurriness of vision Ears, nose, mouth, throat, and face: Denies mucositis or sore throat Respiratory: Denies cough,  dyspnea or wheezes Cardiovascular: Denies palpitation, chest discomfort or lower extremity swelling Gastrointestinal:  Denies nausea, heartburn or change in bowel habits Skin: Denies abnormal skin rashes Lymphatics: Denies new lymphadenopathy or easy bruising Neurological:Denies numbness, tingling or new weaknesses Behavioral/Psych: Mood is stable, no new changes  All other systems were reviewed with the patient and are negative.  PHYSICAL EXAMINATION: ECOG PERFORMANCE STATUS: 0 - Asymptomatic  Filed Vitals:   09/29/12 0942  BP: 137/70  Pulse: 56  Temp: 97.9 F (36.6 C)  Resp: 19    GENERAL:alert, no distress and comfortable SKIN: skin color, texture, turgor are normal, no rashes or significant lesions EYES: normal, Conjunctiva are pink and non-injected, sclera clear OROPHARYNX:no exudate, no erythema and lips, buccal mucosa, and tongue normal  NECK: supple, thyroid normal size, non-tender, without nodularity LYMPH:  no palpable lymphadenopathy in the cervical, axillary or inguinal LUNGS: clear to auscultation and percussion with normal breathing effort HEART: regular rate & rhythm and no murmurs and no lower extremity edema ABDOMEN:abdomen soft, non-tender and normal bowel sounds Musculoskeletal:no cyanosis of digits and no clubbing  NEURO: alert & oriented x 3 with fluent speech, no focal motor/sensory deficits  LABORATORY DATA:  I have reviewed the data as listed Results for orders placed in visit on 09/29/12 (from the past 48 hour(s))  CBC WITH DIFFERENTIAL     Status: Abnormal   Collection Time    09/29/12  9:15 AM      Result Value Range   WBC 4.6  4.0 - 10.3 10e3/uL   NEUT# 2.9  1.5 - 6.5 10e3/uL   HGB 12.2 (*) 13.0 - 17.1 g/dL   HCT 95.2 (*) 84.1 - 32.4 %   Platelets 178  140 - 400 10e3/uL   MCV  85.8  79.3 - 98.0 fL   MCH 27.5  27.2 - 33.4 pg   MCHC 32.0  32.0 - 36.0 g/dL   RBC 4.09  8.11 - 9.14 10e6/uL   RDW 15.0 (*) 11.0 - 14.6 %   lymph# 1.1  0.9 - 3.3  10e3/uL   MONO# 0.5  0.1 - 0.9 10e3/uL   Eosinophils Absolute 0.1  0.0 - 0.5 10e3/uL   Basophils Absolute 0.1  0.0 - 0.1 10e3/uL   NEUT% 62.4  39.0 - 75.0 %   LYMPH% 24.1  14.0 - 49.0 %   MONO% 10.0  0.0 - 14.0 %   EOS% 2.2  0.0 - 7.0 %   BASO% 1.3  0.0 - 2.0 %  COMPREHENSIVE METABOLIC PANEL (CC13)     Status: Abnormal   Collection Time    09/29/12  9:16 AM      Result Value Range   Sodium 139  136 - 145 mEq/L   Potassium 4.0  3.5 - 5.1 mEq/L   Chloride 95 (*) 98 - 109 mEq/L   CO2 31 (*) 22 - 29 mEq/L   Glucose 265 (*) 70 - 140 mg/dl   BUN 78.2 (*) 7.0 - 95.6 mg/dL   Creatinine 7.3 (*) 0.7 - 1.3 mg/dL   Total Bilirubin 2.13  0.20 - 1.20 mg/dL   Alkaline Phosphatase 76  40 - 150 U/L   AST 13  5 - 34 U/L   ALT 15  0 - 55 U/L   Total Protein 7.8  6.4 - 8.3 g/dL   Albumin 3.6  3.5 - 5.0 g/dL   Calcium 9.3  8.4 - 08.6 mg/dL    ASSESSMENT: MGUS, end-stage renal failure undergoing dialysis, anemia   PLAN:  #1 MGUS Is unclear to me whether the patient did have a smoldering myeloma back in March of 2014. He received chemotherapy briefly and since then has not received any further treatment after multiple expert opinions who recommended the patient to be observed only. He is completely asymptomatic and I think this is reasonable. I recommend we seen in the clinic for history, physical examination, and blood work every 6 months. I educated the patient and signs and symptoms to watch out for disease progression such as new areas of bone pain, unexplained weight loss or night sweats. I have reviewed his most recent her opinion consult notes from [facility]. #2 end-stage renal disease His kidney biopsy from May did not show myeloma involvement. It is unclear whether this could be related to diabetes only whether he did have myeloma involvement that resolved after receiving chemotherapy. In any case he will continue hemodialysis as directed by his nephrologist #3 anemia This is due to  anemia of chronic disease. He is much improved compared to his previous lab result. He is completely asymptomatic and I recommend observation only.  All questions were answered. The patient knows to call the clinic with any problems, questions or concerns. We can certainly see the patient much sooner if necessary. No barriers to learning was detected.  The patient and plan discussed with [facility], Nadina Fomby  and he is in agreement with the aforementioned.  I spent 25 minutes counseling the patient face to face. The total time spent in the appointment was 40 minutes and more than 50% was on counseling.     Iven Earnhart, MD 09/29/2012 12:12 PM

## 2012-09-29 NOTE — Telephone Encounter (Signed)
Gave pt appt for March 2015 for MD only

## 2012-10-01 LAB — SPEP & IFE WITH QIG
Alpha-1-Globulin: 6.3 % — ABNORMAL HIGH (ref 2.9–4.9)
Beta 2: 5.1 % (ref 3.2–6.5)
Gamma Globulin: 19.1 % — ABNORMAL HIGH (ref 11.1–18.8)
IgA: 217 mg/dL (ref 68–379)

## 2012-10-06 ENCOUNTER — Ambulatory Visit: Payer: Medicare Other | Admitting: Neurology

## 2012-10-20 ENCOUNTER — Ambulatory Visit: Payer: Medicare Other

## 2012-10-20 ENCOUNTER — Ambulatory Visit (INDEPENDENT_AMBULATORY_CARE_PROVIDER_SITE_OTHER): Payer: Medicare Other

## 2012-10-20 VITALS — BP 131/69 | HR 68 | Temp 98.3°F | Resp 16 | Ht 70.0 in | Wt 195.0 lb

## 2012-10-20 DIAGNOSIS — L97509 Non-pressure chronic ulcer of other part of unspecified foot with unspecified severity: Secondary | ICD-10-CM

## 2012-10-20 MED ORDER — SILVER SULFADIAZINE 1 % EX CREA: TOPICAL_CREAM | Freq: Two times a day (BID) | CUTANEOUS | Status: DC

## 2012-10-20 MED ORDER — CEPHALEXIN 500 MG PO CAPS: 500.0000 mg | ORAL_CAPSULE | Freq: Two times a day (BID) | ORAL | Status: DC

## 2012-10-20 NOTE — Patient Instructions (Signed)
Instructions for Wound Care  The most important step to healing a foot wound is to reduce the pressure on your foot - it is extremely important to stay off your foot as much as possible and wear the shoe/boot as instructed.  Cleanse your foot with saline wash or warm soapy water (dial antibacterial soap or similar).  Blot dry.  Apply prescribed medication to your wound and cover with gauze and a bandage.  May hold bandage in place with Coban (self sticky wrap), Ace bandage or tape.  You may find dressing supplies at your local [employer], [employer], drug store or medical supply store.  Your prescribed topical medication is : Silvadene cream one-person  Silvadene Cream (twice daily)  [employer] is a mail order medical supply company that we use to provide some of our would care products.  If we use their service of you, you will receive the product by mail.  If you have not received the medication in 3 business days, please call our office.  If you notice any foul odor, increase in pain, pus, increased swelling, red streaks or generalized redness occurring in your foot or leg-Call our office immediately to be seen.  This may be a sign of a limb or life threatening infection that will need prompt attention.  Alvan Dame [facility]  [facility]  [phone] [place]  [phone] [facility]    Diabetes and Foot Care Diabetes may cause you to have a poor blood supply (circulation) to your legs and feet. Because of this, the skin may be thinner, break easier, and heal more slowly. You also may have nerve damage in your legs and feet causing decreased feeling. You may not notice minor injuries to your feet that could lead to serious problems or infections. Taking care of your feet is one of the most important things you can do for yourself.  HOME CARE INSTRUCTIONS  Do not go barefoot. Bare feet are easily injured.  Check your feet daily for blisters, cuts, and redness.  Wash your  feet with warm water (not hot) and mild soap. Pat your feet and between your toes until completely dry.  Apply a moisturizing lotion that does not contain alcohol or petroleum jelly to the dry skin on your feet and to dry brittle toenails. Do not put it between your toes.  Trim your toenails straight across. Do not dig under them or around the cuticle.  Do not cut corns or calluses, or try to remove them with medicine.  Wear clean cotton socks or stockings every day. Make sure they are not too tight. Do not wear knee high stockings since they may decrease blood flow to your legs.  Wear leather shoes that fit properly and have enough cushioning. To break in new shoes, wear them just a few hours a day to avoid injuring your feet.  Wear shoes at all times, even in the house.  Do not cross your legs. This may decrease the blood flow to your feet.  If you find a minor scrape, cut, or break in the skin on your feet, keep it and the skin around it clean and dry. These areas may be cleansed with mild soap and water. Do not use peroxide, alcohol, iodine or Merthiolate.  When you remove an adhesive bandage, be sure not to harm the skin around it.  If you have a wound, look at it several times a day to make sure it is healing.  Do not use heating pads  or hot water bottles. Burns can occur. If you have lost feeling in your feet or legs, you may not know it is happening until it is too late.  Report any cuts, sores or bruises to your caregiver. Do not wait! SEEK MEDICAL CARE IF:   You have an injury that is not healing or you notice redness, numbness, burning, or tingling.  Your feet always feel cold.  You have pain or cramps in your legs and feet. SEEK IMMEDIATE MEDICAL CARE IF:   There is increasing redness, swelling, or increasing pain in the wound.  There is a red line that goes up your leg.  Pus is coming from a wound.  You develop an unexplained oral temperature above 102 F (38.9  C), or as your caregiver suggests.  You notice a bad smell coming from an ulcer or wound. MAKE SURE YOU:   Understand these instructions.  Will watch your condition.  Will get help right away if you are not doing well or get worse. Document Released: 12/29/1999 Document Revised: 03/25/2011 Document Reviewed: 07/06/2008 [facility] Patient Information 2014 [place], [place].

## 2012-10-20 NOTE — Progress Notes (Signed)
Subjective:    Patient ID: Jack [name], male    DOB: 11-11-1945, 67 y.o.   MRN: [id number]  HPI patient presents on referral from Dr. Lowell Guitar his nephrologist with complaint of a sore on the lateral undersurface of right great toe. The ulcers been present for an unknown period of time. The ulcer has necrotic center with mild serous discharge and drainage and  localized erythema. Patient has a history of diabetes with custom company factors and is unaware of the neuropathy. He is wearing a pair slip on type loafer shoes. X-rays were taken to confirm no bony lyses severe calcifications of vessels were noted. No previous treatment by the patient or family members. Patient currently on dialysis since March of this year.   The wound measures proximally 1.5 x 3.5 cm overall diameter with about a 1.0 x 2.0 central necrotic area which fibrous plug. There is no surrounding hemorrhage keratoses and erythema. Slight localized increased temperature.    Review of Systems  Constitutional: Positive for appetite change.  HENT: Positive for neck pain.   Eyes: Positive for itching.  Respiratory: Negative.   Cardiovascular: Negative.   Gastrointestinal: Negative.   Endocrine: Negative.   Genitourinary: Positive for frequency, decreased urine volume and difficulty urinating.  Musculoskeletal: Positive for back pain.  Skin: Positive for wound.  Allergic/Immunologic: Negative.   Neurological: Negative.   Hematological: Negative.   Psychiatric/Behavioral: Negative.        Objective:   Physical Exam  Vitals reviewed. Constitutional: He is oriented to person, place, and time. He appears well-developed and well-nourished.  HENT:  Head: Normocephalic and atraumatic.  Cardiovascular: Exam reveals decreased pulses.   Pulses:      Dorsalis pedis pulses are 0 on the right side, and 0 on the left side.       Posterior tibial pulses are 0 on the right side, and 0 on the left side.  Capillary refill time  3-4 seconds all digits temperature is warm to cool. Hair growth is absent bilateral. There is an ischemic necrotic ulcer plantar lateral right hallux. Patient also has significant swelling +2 edema of both lower extremities and ankles  Neurological: He is alert and oriented to person, place, and time. He has normal strength and normal reflexes.  Epicritic and proprioceptive sensations are grossly diminished. On Phoebe Perch testing had absence of feeling ankles dorsal and plantar foot and all digits bilateral. Decreased vibratory sensation bilateral. DTRs not elicited bilateral  Skin: Skin is warm. No cyanosis. Nails show no clubbing.  Hair growth absent thin shiny skin is identified bilateral. Ulceration approximately 1.5 x 3.5 cm with a 1 x 2 cm central necrotic area plantar lateral right hallux. Mild serous drainage is noted there is surrounding field and erythema no ascending cellulitis or lymphangitis noted.  Psychiatric: He has a normal mood and affect. His behavior is normal. Judgment and thought content normal.   pedal pulses cannot be palpated bilateral temperature warm to cool excess are somewhat slight warmth to the right great toe area. Neurologically epicritic and proprioceptive sensations grossly diminished bilateral absent sensations S. way Kerby Moors testing bilateral feet and ankles. DTRs none listed. Neurologically hair growth is diminished to absent bilateral. Ulcer hallux noted as mentioned above. Orthopedic biomechanical exam unremarkable x-rays revealed severe calcified vessels large inferior retroperitoneal spurs were noted as well mild arthrosis noted.        Assessment & Plan:  The ulcers assessed and measured this time ulcer. The ulcer is debrided sharply to  some pinpoint bleeding Silvadene and gauze dressing were applied. Darco shoe was dispensed with ABN form been reviewed. Patient was given instructions for daily cleansing with soap and water and Silvadene dressing  changes twice a day. Prescription for cephalexin 500 mg twice a day. Return visit in 2 weeks. Contact us at any changes in severity or worsening were to occur. Patient may be a candidate for vascular consultation as well. Her calcified vessels down to the toe level means there is little can be done for revascularization.  Alvan Dame DPM

## 2012-10-23 ENCOUNTER — Ambulatory Visit: Payer: Medicare Other

## 2012-11-03 ENCOUNTER — Ambulatory Visit: Payer: Medicare Other

## 2012-11-04 ENCOUNTER — Other Ambulatory Visit: Payer: Self-pay | Admitting: *Deleted

## 2012-11-04 DIAGNOSIS — L97909 Non-pressure chronic ulcer of unspecified part of unspecified lower leg with unspecified severity: Secondary | ICD-10-CM

## 2012-11-04 DIAGNOSIS — I739 Peripheral vascular disease, unspecified: Secondary | ICD-10-CM

## 2012-11-05 ENCOUNTER — Ambulatory Visit (INDEPENDENT_AMBULATORY_CARE_PROVIDER_SITE_OTHER)
Admission: RE | Admit: 2012-11-05 | Discharge: 2012-11-05 | Disposition: A | Discharge status: Home / Self Care | Payer: Medicare Other | Source: Ambulatory Visit | Attending: Vascular Surgery | Admitting: Vascular Surgery

## 2012-11-05 ENCOUNTER — Ambulatory Visit (INDEPENDENT_AMBULATORY_CARE_PROVIDER_SITE_OTHER): Payer: Medicare Other | Admitting: Vascular Surgery

## 2012-11-05 ENCOUNTER — Other Ambulatory Visit: Payer: Self-pay

## 2012-11-05 ENCOUNTER — Encounter: Payer: Self-pay | Admitting: Vascular Surgery

## 2012-11-05 VITALS — BP 95/58 | HR 64 | Temp 98.5°F | Resp 16 | Ht 70.0 in | Wt 198.0 lb

## 2012-11-05 DIAGNOSIS — L97809 Non-pressure chronic ulcer of other part of unspecified lower leg with unspecified severity: Secondary | ICD-10-CM | POA: Insufficient documentation

## 2012-11-05 DIAGNOSIS — L97909 Non-pressure chronic ulcer of unspecified part of unspecified lower leg with unspecified severity: Secondary | ICD-10-CM

## 2012-11-05 DIAGNOSIS — I739 Peripheral vascular disease, unspecified: Secondary | ICD-10-CM

## 2012-11-05 DIAGNOSIS — I7025 Atherosclerosis of native arteries of other extremities with ulceration: Secondary | ICD-10-CM | POA: Insufficient documentation

## 2012-11-05 MED ORDER — DIPHENHYDRAMINE HCL 50 MG PO CAPS: ORAL_CAPSULE | ORAL | Status: DC

## 2012-11-05 MED ORDER — PREDNISONE 50 MG PO TABS: ORAL_TABLET | ORAL | Status: DC

## 2012-11-05 NOTE — Progress Notes (Addendum)
[facility] [place] HISTORY AND PHYSICAL   History of Present Illness:  Patient is a 67 y.o. year old male who presents for evaluation of a right first toe wound. The patient noted breakdown of the skin of his right first toe approximately 2 weeks ago. He denies any trauma. He ambulates with a walker. He was seen by Dr. Powell and sent to [facility]. He was told at [facility] he had poor circulation to his right leg. He was referred here for further evaluation.  Other medical problems include diabetes for 25 years, hypertension, multiple myeloma, end-stage renal disease Monday Wednesday Friday at [facility], asthma all of which are currently controlled. He is a former smoker but quit years ago.  Past Medical History  Diagnosis Date  . Hypertension   . Diabetes mellitus without complication   . Shortness of breath   . Pneumonia     2012  . Heart murmur   . Glaucoma   . Multiple myeloma, without mention of having achieved remission(203.00) 03/30/2012    Cytogenetic neg on 03/23/2012.  . End stage renal disease on dialysis   . Asthma     Past Surgical History  Procedure Laterality Date  . Thyroidectomy    . Cervical disc surgery    . Eye surgery      CATARACTS  . Insertion of dialysis catheter Right 03/19/2012    Procedure: INSERTION OF DIALYSIS CATHETER;  Surgeon: James D Lawson, MD;  Location: [facility] OR;  Service: Vascular;  Laterality: Right;  Right Internal Jugular  . Av fistula placement Left 03/25/2012    Procedure: ARTERIOVENOUS (AV) FISTULA CREATION;  Surgeon: James D Lawson, MD;  Location: [facility] OR;  Service: Vascular;  Laterality: Left;  . Ligation of competing branches of arteriovenous fistula Left 05/08/2012    Procedure: LIGATION OF COMPETING BRANCHES OF ARTERIOVENOUS FISTULA;  Surgeon: James D Lawson, MD;  Location: [facility] OR;  Service: Vascular;  Laterality: Left;  Ultrasound guided    Social History History  Substance Use Topics  . Smoking status:  Former Smoker    Types: Cigarettes    Quit date: 03/19/1982  . Smokeless tobacco: Former User  . Alcohol Use: No    Family History Family History  Problem Relation Age of Onset  . Diabetes Mother   . Cancer Mother     bone   . Kidney disease Father     Allergies  Allergies  Allergen Reactions  . Lisinopril Cough  . Ivp Dye [Iodinated Diagnostic Agents] Hives  . Morphine And Related Other (See Comments)    Bradycardia states patient     Current Outpatient Prescriptions  Medication Sig Dispense Refill  . amLODipine (NORVASC) 10 MG tablet Take 10 mg by mouth every morning.      . aspirin EC 81 MG tablet Take 81 mg by mouth daily.      . BYSTOLIC 5 MG tablet Take 5 mg by mouth daily.      . calcium acetate (PHOSLO) 667 MG capsule Take 667 mg by mouth 2 (two) times daily.      . cephALEXin (KEFLEX) 500 MG capsule Take 1 capsule (500 mg total) by mouth 2 (two) times daily.  20 capsule  1  . dexamethasone (DECADRON) 4 MG tablet       . doxazosin (CARDURA) 2 MG tablet Take 2 mg by mouth at bedtime.      . gabapentin (NEURONTIN) 100 MG capsule Take 100 mg by mouth. Take   1 capsule in the morning and 2 caps at night      . levothyroxine (SYNTHROID, LEVOTHROID) 175 MCG tablet Take 175 mcg by mouth every morning.       . multivitamin (RENA-VIT) TABS tablet Take 1 tablet by mouth at bedtime.  30 tablet  0  . saxagliptin HCl (ONGLYZA) 5 MG TABS tablet Take 5 mg by mouth daily.      . silver sulfADIAZINE (SILVADENE) 1 % cream Apply topically 2 (two) times daily. Applied to the affected area of ulcer twice a day following cleansing of wound. Cover with dry gauze dressing.  50 g  3  . AFLURIA PRESERVATIVE FREE injection       . calcitRIOL (ROCALTROL) 0.25 MCG capsule Take 0.25 mcg by mouth daily.       No current facility-administered medications for this visit.    ROS:   General:  No weight loss, Fever, chills  HEENT: No recent headaches, no nasal bleeding, no visual changes, no  sore throat  Neurologic: No dizziness, blackouts, seizures. No recent symptoms of stroke or mini- stroke. No recent episodes of slurred speech, or temporary blindness.  Cardiac: No recent episodes of chest pain/pressure, no shortness of breath at rest.  + shortness of breath with exertion.  Denies history of atrial fibrillation or irregular heartbeat  Vascular: No history of rest pain in feet.  No history of claudication.  No history of non-healing ulcer, No history of DVT   Pulmonary: No home oxygen, no productive cough, no hemoptysis,  No asthma or wheezing  Musculoskeletal:  [ ] Arthritis, [ ] Low back pain,  [ ] Joint pain  Hematologic:No history of hypercoagulable state.  No history of easy bleeding.  No history of anemia  Gastrointestinal: No hematochezia or melena,  No gastroesophageal reflux, no trouble swallowing  Urinary: [ x] chronic Kidney disease, [x ] on HD - [x ] MWF or [ ] TTHS, [ ] Burning with urination, [ ] Frequent urination, [ ] Difficulty urinating; makes some urine  Skin: No rashes  Psychological: No history of anxiety,  No history of depression   Physical Examination  Filed Vitals:   11/05/12 0945  BP: 95/58  Pulse: 64  Temp: 98.5 F (36.9 C)  TempSrc: Oral  Resp: 16  Height: 5' 10" (1.778 m)  Weight: 198 lb (89.812 kg)  SpO2: 100%    Body mass index is 28.41 kg/(m^2).  General:  Alert and oriented, no acute distress HEENT: Normal Neck: No bruit or JVD Pulmonary: Clear to auscultation bilaterally Cardiac: Regular Rate and Rhythm with 3/6 systolic murmur Abdomen: Soft, non-tender, non-distended, no mass Skin: No rash, early gangrenous changes right first toe and lateral aspect of second toe Extremity Pulses:  2+ radial, brachial, femoral, absent popliteal dorsalis pedis, posterior tibial pulses bilaterally Musculoskeletal: No deformity or edema  Neurologic: Upper and lower extremity motor 5/5 and symmetric  DATA:  Patient had a noninvasive  arterial study today which are reviewed and interpreted. It should calcified vessels bilaterally. Right side with triphasic, left-sided biphasic   ASSESSMENT:  Gangrene right foot with most likely severe calcified atherosclerosis bilateral lower extremities at risk for limb loss right leg.   PLAN:  Aortogram with lower extremity runoff possible intervention tomorrow. He will need contrast premedication due to previous hive reaction. Risk benefits possible complications and procedure details were explained the patient today. He understands and agrees to proceed. We will try to switch his dialysis day to Saturday. However I did   inform the patient that if his potassium is too elevated tomorrow he will need dialysis instead and we will have to reschedule his arteriogram. I also discussed with the patient today that he will need amputation of the right first toe and potentially the second toe regardless of outcome of the arteriogram.  Kalima Saylor, MD [facility] [place] Office: [phone] Pager: [phone]  For VQI Use Only  PRE-ADM LIVING: Home  AMB STATUS: Ambulatory with Assistance  CAD Sx: None  PRIOR CHF: None  STRESS TEST: [ ] No, [ ] Normal, [ ] + ischemia, [ ] + MI, [ ] Both   

## 2012-11-06 ENCOUNTER — Ambulatory Visit (HOSPITAL_COMMUNITY)
Admission: RE | Admit: 2012-11-06 | Discharge: 2012-11-06 | Disposition: A | Discharge status: Home / Self Care | Payer: Medicare Other | Source: Ambulatory Visit | Attending: Vascular Surgery | Admitting: Vascular Surgery

## 2012-11-06 ENCOUNTER — Encounter (HOSPITAL_COMMUNITY): Payer: Self-pay | Admitting: *Deleted

## 2012-11-06 ENCOUNTER — Encounter (HOSPITAL_COMMUNITY): Payer: Self-pay | Admitting: Pharmacy Technician

## 2012-11-06 ENCOUNTER — Encounter (HOSPITAL_COMMUNITY)
Admission: RE | Disposition: A | Discharge status: Home / Self Care | Payer: Self-pay | Source: Ambulatory Visit | Attending: Vascular Surgery

## 2012-11-06 DIAGNOSIS — N186 End stage renal disease: Secondary | ICD-10-CM | POA: Insufficient documentation

## 2012-11-06 DIAGNOSIS — I120 Hypertensive chronic kidney disease with stage 5 chronic kidney disease or end stage renal disease: Secondary | ICD-10-CM | POA: Insufficient documentation

## 2012-11-06 DIAGNOSIS — C9000 Multiple myeloma not having achieved remission: Secondary | ICD-10-CM | POA: Insufficient documentation

## 2012-11-06 DIAGNOSIS — Z992 Dependence on renal dialysis: Secondary | ICD-10-CM | POA: Insufficient documentation

## 2012-11-06 DIAGNOSIS — I70269 Atherosclerosis of native arteries of extremities with gangrene, unspecified extremity: Secondary | ICD-10-CM

## 2012-11-06 DIAGNOSIS — E1159 Type 2 diabetes mellitus with other circulatory complications: Secondary | ICD-10-CM | POA: Insufficient documentation

## 2012-11-06 HISTORY — PX: ABDOMINAL AORTAGRAM: SHX5454

## 2012-11-06 LAB — POCT I-STAT, CHEM 8
BUN: 45 mg/dL — ABNORMAL HIGH (ref 6–23)
Creatinine, Ser: 8.8 mg/dL — ABNORMAL HIGH (ref 0.50–1.35)
Glucose, Bld: 446 mg/dL — ABNORMAL HIGH (ref 70–99)
Potassium: 4 mEq/L (ref 3.5–5.1)
Sodium: 133 mEq/L — ABNORMAL LOW (ref 135–145)

## 2012-11-06 LAB — GLUCOSE, CAPILLARY: Glucose-Capillary: 377 mg/dL — ABNORMAL HIGH (ref 70–99)

## 2012-11-06 SURGERY — ABDOMINAL AORTAGRAM
Anesthesia: LOCAL | Laterality: Bilateral

## 2012-11-06 MED ORDER — ACETAMINOPHEN 325 MG RE SUPP: 325.0000 mg | RECTAL | Status: DC | PRN

## 2012-11-06 MED ORDER — MIDAZOLAM HCL 2 MG/2ML IJ SOLN
INTRAMUSCULAR | Status: AC
  Filled 2012-11-06: qty 2

## 2012-11-06 MED ORDER — OXYCODONE-ACETAMINOPHEN 5-325 MG PO TABS: 1.0000 | ORAL_TABLET | ORAL | Status: DC | PRN

## 2012-11-06 MED ORDER — LABETALOL HCL 5 MG/ML IV SOLN: 10.0000 mg | INTRAVENOUS | Status: DC | PRN

## 2012-11-06 MED ORDER — INSULIN ASPART 100 UNIT/ML ~~LOC~~ SOLN
10.0000 [IU] | Freq: Once | SUBCUTANEOUS | Status: AC
  Administered 2012-11-06: 10 [IU] via SUBCUTANEOUS
  Filled 2012-11-06: qty 1

## 2012-11-06 MED ORDER — ACETAMINOPHEN 325 MG PO TABS: 325.0000 mg | ORAL_TABLET | ORAL | Status: DC | PRN

## 2012-11-06 MED ORDER — FENTANYL CITRATE 0.05 MG/ML IJ SOLN: 12.5000 ug | INTRAMUSCULAR | Status: DC | PRN

## 2012-11-06 MED ORDER — INSULIN ASPART 100 UNIT/ML ~~LOC~~ SOLN: 0.0000 [IU] | SUBCUTANEOUS | Status: DC

## 2012-11-06 MED ORDER — SODIUM CHLORIDE 0.9 % IJ SOLN
3.0000 mL | INTRAMUSCULAR | Status: DC | PRN
  Administered 2012-11-06: 3 mL via INTRAVENOUS

## 2012-11-06 MED ORDER — INSULIN ASPART 100 UNIT/ML ~~LOC~~ SOLN
10.0000 [IU] | Freq: Once | SUBCUTANEOUS | Status: DC
  Filled 2012-11-06: qty 0.1

## 2012-11-06 MED ORDER — HYDRALAZINE HCL 20 MG/ML IJ SOLN: 10.0000 mg | INTRAMUSCULAR | Status: DC | PRN

## 2012-11-06 MED ORDER — FENTANYL CITRATE 0.05 MG/ML IJ SOLN
INTRAMUSCULAR | Status: AC
  Filled 2012-11-06: qty 2

## 2012-11-06 MED ORDER — METOPROLOL TARTRATE 1 MG/ML IV SOLN: 2.0000 mg | INTRAVENOUS | Status: DC | PRN

## 2012-11-06 MED ORDER — ONDANSETRON HCL 4 MG/2ML IJ SOLN: 4.0000 mg | Freq: Four times a day (QID) | INTRAMUSCULAR | Status: DC | PRN

## 2012-11-06 NOTE — Interval H&P Note (Signed)
History and Physical Interval Note:  11/06/2012 8:57 AM  Jack [name]  has presented today for surgery, with the diagnosis of PVD  The various methods of treatment have been discussed with the patient and family. After consideration of risks, benefits and other options for treatment, the patient has consented to  Procedure(s): ABDOMINAL AORTAGRAM (Bilateral) as a surgical intervention .  The patient's history has been reviewed, patient examined, no change in status, stable for surgery.  I have reviewed the patient's chart and labs.  Questions were answered to the patient's satisfaction.     Jahmil Macleod E

## 2012-11-06 NOTE — H&P (View-Only) (Signed)
[facility] OF [place] HISTORY AND PHYSICAL   History of Present Illness:  Patient is a 67 y.o. year old male who presents for evaluation of a right first toe wound. The patient noted breakdown of the skin of his right first toe approximately 2 weeks ago. He denies any trauma. He ambulates with a walker. He was seen by Dr. Lowell Guitar and sent to [facility]. He was told at [facility] he had poor circulation to his right leg. He was referred here for further evaluation.  Other medical problems include diabetes for 25 years, hypertension, multiple myeloma, end-stage renal disease Monday Wednesday Friday at [employer], asthma all of which are currently controlled. He is a former smoker but quit years ago.  Past Medical History  Diagnosis Date  . Hypertension   . Diabetes mellitus without complication   . Shortness of breath   . Pneumonia     2012  . Heart murmur   . Glaucoma   . Multiple myeloma, without mention of having achieved remission(203.00) 03/30/2012    Cytogenetic neg on 03/23/2012.  . End stage renal disease on dialysis   . Asthma     Past Surgical History  Procedure Laterality Date  . Thyroidectomy    . Cervical disc surgery    . Eye surgery      CATARACTS  . Insertion of dialysis catheter Right 03/19/2012    Procedure: INSERTION OF DIALYSIS CATHETER;  Surgeon: Pryor Ochoa, MD;  Location: [facility] OR;  Service: Vascular;  Laterality: Right;  Right Internal Jugular  . Av fistula placement Left 03/25/2012    Procedure: ARTERIOVENOUS (AV) FISTULA CREATION;  Surgeon: Pryor Ochoa, MD;  Location: [facility] OR;  Service: Vascular;  Laterality: Left;  . Ligation of competing branches of arteriovenous fistula Left 05/08/2012    Procedure: LIGATION OF COMPETING BRANCHES OF ARTERIOVENOUS FISTULA;  Surgeon: Pryor Ochoa, MD;  Location: [facility] OR;  Service: Vascular;  Laterality: Left;  Ultrasound guided    Social History History  Substance Use Topics  . Smoking status:  Former Smoker    Types: Cigarettes    Quit date: 03/19/1982  . Smokeless tobacco: Former Neurosurgeon  . Alcohol Use: No    Family History Family History  Problem Relation Age of Onset  . Diabetes Mother   . Cancer Mother     bone   . Kidney disease Father     Allergies  Allergies  Allergen Reactions  . Lisinopril Cough  . Ivp Dye [Iodinated Diagnostic Agents] Hives  . Morphine And Related Other (See Comments)    Bradycardia states patient     Current Outpatient Prescriptions  Medication Sig Dispense Refill  . amLODipine (NORVASC) 10 MG tablet Take 10 mg by mouth every morning.      Marland Kitchen aspirin EC 81 MG tablet Take 81 mg by mouth daily.      Marland Kitchen BYSTOLIC 5 MG tablet Take 5 mg by mouth daily.      . calcium acetate (PHOSLO) 667 MG capsule Take 667 mg by mouth 2 (two) times daily.      . cephALEXin (KEFLEX) 500 MG capsule Take 1 capsule (500 mg total) by mouth 2 (two) times daily.  20 capsule  1  . dexamethasone (DECADRON) 4 MG tablet       . doxazosin (CARDURA) 2 MG tablet Take 2 mg by mouth at bedtime.      . gabapentin (NEURONTIN) 100 MG capsule Take 100 mg by mouth. Take  1 capsule in the morning and 2 caps at night      . levothyroxine (SYNTHROID, LEVOTHROID) 175 MCG tablet Take 175 mcg by mouth every morning.       . multivitamin (RENA-VIT) TABS tablet Take 1 tablet by mouth at bedtime.  30 tablet  0  . saxagliptin HCl (ONGLYZA) 5 MG TABS tablet Take 5 mg by mouth daily.      . silver sulfADIAZINE (SILVADENE) 1 % cream Apply topically 2 (two) times daily. Applied to the affected area of ulcer twice a day following cleansing of wound. Cover with dry gauze dressing.  50 g  3  . AFLURIA PRESERVATIVE FREE injection       . calcitRIOL (ROCALTROL) 0.25 MCG capsule Take 0.25 mcg by mouth daily.       No current facility-administered medications for this visit.    ROS:   General:  No weight loss, Fever, chills  HEENT: No recent headaches, no nasal bleeding, no visual changes, no  sore throat  Neurologic: No dizziness, blackouts, seizures. No recent symptoms of stroke or mini- stroke. No recent episodes of slurred speech, or temporary blindness.  Cardiac: No recent episodes of chest pain/pressure, no shortness of breath at rest.  + shortness of breath with exertion.  Denies history of atrial fibrillation or irregular heartbeat  Vascular: No history of rest pain in feet.  No history of claudication.  No history of non-healing ulcer, No history of DVT   Pulmonary: No home oxygen, no productive cough, no hemoptysis,  No asthma or wheezing  Musculoskeletal:  [ ]  Arthritis, [ ]  Low back pain,  [ ]  Joint pain  Hematologic:No history of hypercoagulable state.  No history of easy bleeding.  No history of anemia  Gastrointestinal: No hematochezia or melena,  No gastroesophageal reflux, no trouble swallowing  Urinary: [ x] chronic Kidney disease, [x ] on HD - [x ] MWF or [ ]  TTHS, [ ]  Burning with urination, [ ]  Frequent urination, [ ]  Difficulty urinating; makes some urine  Skin: No rashes  Psychological: No history of anxiety,  No history of depression   Physical Examination  Filed Vitals:   11/05/12 0945  BP: 95/58  Pulse: 64  Temp: 98.5 F (36.9 C)  TempSrc: Oral  Resp: 16  Height: 5\' 10"  (1.778 m)  Weight: 198 lb (89.812 kg)  SpO2: 100%    Body mass index is 28.41 kg/(m^2).  General:  Alert and oriented, no acute distress HEENT: Normal Neck: No bruit or JVD Pulmonary: Clear to auscultation bilaterally Cardiac: Regular Rate and Rhythm with 3/6 systolic murmur Abdomen: Soft, non-tender, non-distended, no mass Skin: No rash, early gangrenous changes right first toe and lateral aspect of second toe Extremity Pulses:  2+ radial, brachial, femoral, absent popliteal dorsalis pedis, posterior tibial pulses bilaterally Musculoskeletal: No deformity or edema  Neurologic: Upper and lower extremity motor 5/5 and symmetric  DATA:  Patient had a noninvasive  arterial study today which are reviewed and interpreted. It should calcified vessels bilaterally. Right side with triphasic, left-sided biphasic   ASSESSMENT:  Gangrene right foot with most likely severe calcified atherosclerosis bilateral lower extremities at risk for limb loss right leg.   PLAN:  Aortogram with lower extremity runoff possible intervention tomorrow. He will need contrast premedication due to previous hive reaction. Risk benefits possible complications and procedure details were explained the patient today. He understands and agrees to proceed. We will try to switch his dialysis day to Saturday. However I did  inform the patient that if his potassium is too elevated tomorrow he will need dialysis instead and we will have to reschedule his arteriogram. I also discussed with the patient today that he will need amputation of the right first toe and potentially the second toe regardless of outcome of the arteriogram.  Fabienne Bruns, MD [facility] [place] Office: [phone] Pager: [phone]  For VQI Use Only  PRE-ADM LIVING: Home  AMB STATUS: Ambulatory with Assistance  CAD Sx: None  PRIOR CHF: None  STRESS TEST: [ ]  No, [ ]  Normal, [ ]  + ischemia, [ ]  + MI, [ ]  Both

## 2012-11-06 NOTE — Op Note (Signed)
      VASC[facility] SPECIALISTS           OF [place]   Procedure: Aortogram with bilateral lower extremity runoff  Preoperative diagnosis: Gangrene right foot  Postoperative diagnosis: Same  Anesthesia Local with sedation  Operative details: After obtaining informed consent, the patient was taken to the PV LAB. The patient was placed in supine position on the Angio table. Both groins were prepped and draped in usual sterile fashion. Local anesthesia was infiltrated over the left common femoral artery. Initially, ultrasound was used to identify the common femoral artery   An introducer needle was used to cannulate the left common femoral artery and 035 versacore wire threaded into the abdominal aorta under fluoroscopic guidance. Next a 5 French sheath is placed over the guidewire in the left common femoral artery. A 5 French pigtail catheter was placed over the guidewire into the abdominal aorta and abdominal aortogram was obtained.   The left and right renal arteries are patent. The infrarenal abdominal aorta is patent. The left and right common external and internal iliac arteries are patent. The pigtail catheter was pulled down just above the aortic bifurcation and pelvic angiogram obtained. This confirmed the above findings and also showed the left and right common femoral arteries were patent. Next bilateral lower extremity runoff views were performed through the pigtail catheter.  In the left lower extremity, the left common femoral profunda femoris and superficial femoral artery is patent. The superficial femoral artery has several irregular areas but no stenosis greater than 30%. The left popliteal artery is patent. The origin of the anterior tibial artery is patent however it occludes in the mid leg. The origin of the posterior tibial artery is patent but it occludes in the leg. There is one-vessel runoff via the peroneal artery. There is poor filling of the distal foot on the left  side.  In the right lower extremity, the right common femoral artery profunda femoris and superficial femoral artery is patent. The right superficial femoral artery has several irregular areas as on the left but no stenosis greater than 30%. The right popliteal artery is patent. The anterior posterior tibial and peroneal arteries are all patent. However the peroneal artery and posterior tibial arteries are diminutive. The antecubital artery as the primary runoff vessel to the foot. There is again poor filling of the foot distally indicating small vessel disease.  At this point the 5 French pigtail catheter was removed over a guidewire.   The 5 Fr sheath was left in place to be pulled in the holding area. The patient tolerated the procedure well and there were no complications. Patient was taken to the holding area in stable condition.  Operative findings: In line flow bilateral lower extremities 1 vessel peroneal runoff left three-vessel runoff right but diminutive posterior tibial and peroneal artery primary runoff via the anterior tibial artery, small vessel disease in the foot bilaterally   Operative management: The patient will be scheduled for amputation of the right first toe and possibly the second on Tuesday, 11/10/2012  Fabienne Bruns, MD [facility] [place] Office: [phone] Pager: [phone]

## 2012-11-09 ENCOUNTER — Other Ambulatory Visit (HOSPITAL_COMMUNITY): Payer: Self-pay | Admitting: *Deleted

## 2012-11-09 ENCOUNTER — Encounter (HOSPITAL_COMMUNITY): Payer: Self-pay | Admitting: *Deleted

## 2012-11-09 ENCOUNTER — Other Ambulatory Visit: Payer: Self-pay | Admitting: *Deleted

## 2012-11-09 ENCOUNTER — Encounter (HOSPITAL_COMMUNITY): Payer: Self-pay | Admitting: Pharmacy Technician

## 2012-11-09 LAB — GLUCOSE, CAPILLARY: Glucose-Capillary: 324 mg/dL — ABNORMAL HIGH (ref 70–99)

## 2012-11-10 ENCOUNTER — Encounter (HOSPITAL_COMMUNITY): Payer: Self-pay | Admitting: *Deleted

## 2012-11-10 ENCOUNTER — Inpatient Hospital Stay (HOSPITAL_COMMUNITY)
Admission: RE | Admit: 2012-11-10 | Discharge: 2012-11-11 | DRG: 255 | Disposition: A | Discharge status: Home Health | Payer: Medicare Other | Source: Ambulatory Visit | Attending: Vascular Surgery | Admitting: Vascular Surgery

## 2012-11-10 ENCOUNTER — Encounter (HOSPITAL_COMMUNITY): Payer: Medicare Other

## 2012-11-10 ENCOUNTER — Encounter (HOSPITAL_COMMUNITY): Payer: Medicare Other | Admitting: Anesthesiology

## 2012-11-10 ENCOUNTER — Ambulatory Visit: Payer: Medicare Other | Admitting: Vascular Surgery

## 2012-11-10 ENCOUNTER — Telehealth: Payer: Self-pay | Admitting: Vascular Surgery

## 2012-11-10 ENCOUNTER — Encounter (HOSPITAL_COMMUNITY)
Admission: RE | Disposition: A | Discharge status: Home Health | Payer: Self-pay | Source: Ambulatory Visit | Attending: Vascular Surgery

## 2012-11-10 ENCOUNTER — Ambulatory Visit (HOSPITAL_COMMUNITY): Payer: Medicare Other

## 2012-11-10 ENCOUNTER — Ambulatory Visit (HOSPITAL_COMMUNITY): Payer: Medicare Other | Admitting: Anesthesiology

## 2012-11-10 DIAGNOSIS — Z992 Dependence on renal dialysis: Secondary | ICD-10-CM

## 2012-11-10 DIAGNOSIS — H409 Unspecified glaucoma: Secondary | ICD-10-CM | POA: Diagnosis present

## 2012-11-10 DIAGNOSIS — I70269 Atherosclerosis of native arteries of extremities with gangrene, unspecified extremity: Secondary | ICD-10-CM

## 2012-11-10 DIAGNOSIS — L97509 Non-pressure chronic ulcer of other part of unspecified foot with unspecified severity: Secondary | ICD-10-CM | POA: Diagnosis present

## 2012-11-10 DIAGNOSIS — Z87891 Personal history of nicotine dependence: Secondary | ICD-10-CM

## 2012-11-10 DIAGNOSIS — N186 End stage renal disease: Secondary | ICD-10-CM | POA: Diagnosis present

## 2012-11-10 DIAGNOSIS — N2581 Secondary hyperparathyroidism of renal origin: Secondary | ICD-10-CM | POA: Diagnosis present

## 2012-11-10 DIAGNOSIS — Z7982 Long term (current) use of aspirin: Secondary | ICD-10-CM

## 2012-11-10 DIAGNOSIS — E1159 Type 2 diabetes mellitus with other circulatory complications: Principal | ICD-10-CM | POA: Diagnosis present

## 2012-11-10 DIAGNOSIS — J45909 Unspecified asthma, uncomplicated: Secondary | ICD-10-CM | POA: Diagnosis present

## 2012-11-10 DIAGNOSIS — D649 Anemia, unspecified: Secondary | ICD-10-CM | POA: Diagnosis present

## 2012-11-10 DIAGNOSIS — E039 Hypothyroidism, unspecified: Secondary | ICD-10-CM | POA: Diagnosis present

## 2012-11-10 DIAGNOSIS — C9001 Multiple myeloma in remission: Secondary | ICD-10-CM | POA: Diagnosis present

## 2012-11-10 DIAGNOSIS — I120 Hypertensive chronic kidney disease with stage 5 chronic kidney disease or end stage renal disease: Secondary | ICD-10-CM | POA: Diagnosis present

## 2012-11-10 HISTORY — PX: AMPUTATION: SHX166

## 2012-11-10 HISTORY — DX: Secondary hyperparathyroidism of renal origin: N25.81

## 2012-11-10 LAB — PROTIME-INR: Prothrombin Time: 13.1 seconds (ref 11.6–15.2)

## 2012-11-10 LAB — COMPREHENSIVE METABOLIC PANEL
ALT: 11 U/L (ref 0–53)
Albumin: 3.4 g/dL — ABNORMAL LOW (ref 3.5–5.2)
Alkaline Phosphatase: 74 U/L (ref 39–117)
Calcium: 9.1 mg/dL (ref 8.4–10.5)
Chloride: 90 mEq/L — ABNORMAL LOW (ref 96–112)
GFR calc Af Amer: 9 mL/min — ABNORMAL LOW (ref >90)
Glucose, Bld: 305 mg/dL — ABNORMAL HIGH (ref 70–99)
Sodium: 134 mEq/L — ABNORMAL LOW (ref 135–145)
Total Protein: 7.6 g/dL (ref 6.0–8.3)

## 2012-11-10 LAB — CBC
Hemoglobin: 11.6 g/dL — ABNORMAL LOW (ref 13.0–17.0)
MCH: 27 pg (ref 26.0–34.0)
MCHC: 32.4 g/dL (ref 30.0–36.0)
MCV: 83.4 fL (ref 78.0–100.0)
RDW: 14 % (ref 11.5–15.5)

## 2012-11-10 LAB — APTT: aPTT: 26 seconds (ref 24–37)

## 2012-11-10 LAB — GLUCOSE, CAPILLARY
Glucose-Capillary: 287 mg/dL — ABNORMAL HIGH (ref 70–99)
Glucose-Capillary: 254 mg/dL — ABNORMAL HIGH (ref 70–99)

## 2012-11-10 SURGERY — AMPUTATION DIGIT
Anesthesia: General | Site: Toe | Laterality: Right | Wound class: Dirty or Infected

## 2012-11-10 MED ORDER — LIDOCAINE HCL (CARDIAC) 20 MG/ML IV SOLN
INTRAVENOUS | Status: DC | PRN
  Administered 2012-11-10: 50 mg via INTRAVENOUS

## 2012-11-10 MED ORDER — ACETAMINOPHEN 650 MG RE SUPP: 325.0000 mg | RECTAL | Status: DC | PRN

## 2012-11-10 MED ORDER — SODIUM CHLORIDE 0.9 % IJ SOLN: 3.0000 mL | INTRAMUSCULAR | Status: DC | PRN

## 2012-11-10 MED ORDER — DOCUSATE SODIUM 100 MG PO CAPS
100.0000 mg | ORAL_CAPSULE | Freq: Every day | ORAL | Status: DC
  Administered 2012-11-11: 100 mg via ORAL
  Filled 2012-11-10: qty 1

## 2012-11-10 MED ORDER — SODIUM CHLORIDE 0.9 % IJ SOLN
3.0000 mL | Freq: Two times a day (BID) | INTRAMUSCULAR | Status: DC
  Administered 2012-11-10 – 2012-11-11 (×2): 3 mL via INTRAVENOUS

## 2012-11-10 MED ORDER — CALCIUM ACETATE 667 MG PO CAPS
667.0000 mg | ORAL_CAPSULE | Freq: Three times a day (TID) | ORAL | Status: DC
  Filled 2012-11-10 (×2): qty 1

## 2012-11-10 MED ORDER — LEVOTHYROXINE SODIUM 175 MCG PO TABS
175.0000 ug | ORAL_TABLET | Freq: Every morning | ORAL | Status: DC
  Administered 2012-11-11: 175 ug via ORAL
  Filled 2012-11-10: qty 1

## 2012-11-10 MED ORDER — GABAPENTIN 100 MG PO CAPS: 100.0000 mg | ORAL_CAPSULE | Freq: Two times a day (BID) | ORAL | Status: DC

## 2012-11-10 MED ORDER — ASPIRIN EC 81 MG PO TBEC
81.0000 mg | DELAYED_RELEASE_TABLET | Freq: Every day | ORAL | Status: DC
  Administered 2012-11-11: 81 mg via ORAL
  Filled 2012-11-10: qty 1

## 2012-11-10 MED ORDER — SODIUM CHLORIDE 0.9 % IV SOLN: 100.0000 mL | INTRAVENOUS | Status: DC | PRN

## 2012-11-10 MED ORDER — NEBIVOLOL HCL 5 MG PO TABS
5.0000 mg | ORAL_TABLET | Freq: Every day | ORAL | Status: DC
  Administered 2012-11-10: 5 mg via ORAL
  Filled 2012-11-10 (×2): qty 1

## 2012-11-10 MED ORDER — NEPRO/CARBSTEADY PO LIQD: 237.0000 mL | ORAL | Status: DC | PRN

## 2012-11-10 MED ORDER — CALCITRIOL 0.25 MCG PO CAPS: 0.2500 ug | ORAL_CAPSULE | Freq: Every day | ORAL | Status: DC

## 2012-11-10 MED ORDER — DOXERCALCIFEROL 4 MCG/2ML IV SOLN
1.0000 ug | INTRAVENOUS | Status: DC
  Administered 2012-11-11: 1 ug via INTRAVENOUS
  Filled 2012-11-10: qty 2

## 2012-11-10 MED ORDER — DEXTROSE 5 % IV SOLN
1.5000 g | Freq: Two times a day (BID) | INTRAVENOUS | Status: DC
  Filled 2012-11-10: qty 1.5

## 2012-11-10 MED ORDER — EPHEDRINE SULFATE 50 MG/ML IJ SOLN
INTRAMUSCULAR | Status: DC | PRN
  Administered 2012-11-10 (×3): 10 mg via INTRAVENOUS

## 2012-11-10 MED ORDER — NA FERRIC GLUC CPLX IN SUCROSE 12.5 MG/ML IV SOLN
125.0000 mg | INTRAVENOUS | Status: DC
  Administered 2012-11-11: 125 mg via INTRAVENOUS
  Filled 2012-11-10 (×2): qty 10

## 2012-11-10 MED ORDER — HYDROMORPHONE HCL PF 1 MG/ML IJ SOLN: 0.5000 mg | INTRAMUSCULAR | Status: DC | PRN

## 2012-11-10 MED ORDER — LIDOCAINE-PRILOCAINE 2.5-2.5 % EX CREA: 1.0000 "application " | TOPICAL_CREAM | CUTANEOUS | Status: DC | PRN

## 2012-11-10 MED ORDER — NEBIVOLOL HCL 5 MG PO TABS: 5.0000 mg | ORAL_TABLET | Freq: Every day | ORAL | Status: DC

## 2012-11-10 MED ORDER — GABAPENTIN 100 MG PO CAPS
100.0000 mg | ORAL_CAPSULE | Freq: Every day | ORAL | Status: DC
  Administered 2012-11-10 – 2012-11-11 (×2): 100 mg via ORAL
  Filled 2012-11-10 (×2): qty 1

## 2012-11-10 MED ORDER — RENA-VITE PO TABS
1.0000 | ORAL_TABLET | Freq: Every day | ORAL | Status: DC
  Administered 2012-11-10: 1 via ORAL
  Filled 2012-11-10 (×2): qty 1

## 2012-11-10 MED ORDER — LABETALOL HCL 5 MG/ML IV SOLN
10.0000 mg | INTRAVENOUS | Status: DC | PRN
  Filled 2012-11-10: qty 4

## 2012-11-10 MED ORDER — SODIUM CHLORIDE 0.9 % IV SOLN: 250.0000 mL | INTRAVENOUS | Status: DC | PRN

## 2012-11-10 MED ORDER — FENTANYL CITRATE 0.05 MG/ML IJ SOLN: 25.0000 ug | INTRAMUSCULAR | Status: DC | PRN

## 2012-11-10 MED ORDER — LACTATED RINGERS IV SOLN: INTRAVENOUS | Status: DC

## 2012-11-10 MED ORDER — ALTEPLASE 2 MG IJ SOLR: 2.0000 mg | Freq: Once | INTRAMUSCULAR | Status: AC | PRN

## 2012-11-10 MED ORDER — ONDANSETRON HCL 4 MG/2ML IJ SOLN: 4.0000 mg | Freq: Four times a day (QID) | INTRAMUSCULAR | Status: DC | PRN

## 2012-11-10 MED ORDER — GABAPENTIN 100 MG PO CAPS
200.0000 mg | ORAL_CAPSULE | Freq: Every day | ORAL | Status: DC
  Administered 2012-11-10: 200 mg via ORAL
  Filled 2012-11-10 (×2): qty 2

## 2012-11-10 MED ORDER — PROPOFOL 10 MG/ML IV BOLUS
INTRAVENOUS | Status: DC | PRN
  Administered 2012-11-10: 125 mg via INTRAVENOUS

## 2012-11-10 MED ORDER — PHENOL 1.4 % MT LIQD
1.0000 | OROMUCOSAL | Status: DC | PRN
  Filled 2012-11-10: qty 177

## 2012-11-10 MED ORDER — PENTAFLUOROPROP-TETRAFLUOROETH EX AERO: 1.0000 "application " | INHALATION_SPRAY | CUTANEOUS | Status: DC | PRN

## 2012-11-10 MED ORDER — HYDRALAZINE HCL 20 MG/ML IJ SOLN: 10.0000 mg | INTRAMUSCULAR | Status: DC | PRN

## 2012-11-10 MED ORDER — FENTANYL CITRATE 0.05 MG/ML IJ SOLN
INTRAMUSCULAR | Status: DC | PRN
  Administered 2012-11-10: 100 ug via INTRAVENOUS

## 2012-11-10 MED ORDER — OXYCODONE HCL 5 MG PO TABS
5.0000 mg | ORAL_TABLET | ORAL | Status: DC | PRN
  Administered 2012-11-10: 10 mg via ORAL
  Filled 2012-11-10: qty 2

## 2012-11-10 MED ORDER — ACETAMINOPHEN 325 MG PO TABS
325.0000 mg | ORAL_TABLET | ORAL | Status: DC | PRN
  Administered 2012-11-11: 650 mg via ORAL
  Filled 2012-11-10: qty 2

## 2012-11-10 MED ORDER — AMLODIPINE BESYLATE 10 MG PO TABS
10.0000 mg | ORAL_TABLET | Freq: Every day | ORAL | Status: DC
  Administered 2012-11-10: 10 mg via ORAL
  Filled 2012-11-10 (×2): qty 1

## 2012-11-10 MED ORDER — DEXTROSE 5 % IV SOLN
1.5000 g | Freq: Two times a day (BID) | INTRAVENOUS | Status: AC
  Administered 2012-11-11: 1.5 g via INTRAVENOUS
  Filled 2012-11-10 (×2): qty 1.5

## 2012-11-10 MED ORDER — HEPARIN SODIUM (PORCINE) 1000 UNIT/ML DIALYSIS: 1000.0000 [IU] | INTRAMUSCULAR | Status: DC | PRN

## 2012-11-10 MED ORDER — 0.9 % SODIUM CHLORIDE (POUR BTL) OPTIME
TOPICAL | Status: DC | PRN
  Administered 2012-11-10: 1000 mL

## 2012-11-10 MED ORDER — CALCIUM ACETATE 667 MG PO CAPS
1334.0000 mg | ORAL_CAPSULE | Freq: Three times a day (TID) | ORAL | Status: DC
  Administered 2012-11-10 – 2012-11-11 (×2): 1334 mg via ORAL
  Filled 2012-11-10 (×5): qty 2

## 2012-11-10 MED ORDER — CEFAZOLIN SODIUM 1-5 GM-% IV SOLN
INTRAVENOUS | Status: AC
  Filled 2012-11-10: qty 100

## 2012-11-10 MED ORDER — GLYCOPYRROLATE 0.2 MG/ML IJ SOLN
INTRAMUSCULAR | Status: DC | PRN
  Administered 2012-11-10: .2 mg via INTRAVENOUS

## 2012-11-10 MED ORDER — LIDOCAINE HCL (PF) 1 % IJ SOLN: 5.0000 mL | INTRAMUSCULAR | Status: DC | PRN

## 2012-11-10 MED ORDER — INSULIN ASPART 100 UNIT/ML ~~LOC~~ SOLN
0.0000 [IU] | Freq: Three times a day (TID) | SUBCUTANEOUS | Status: DC
Start: 2012-11-10 — End: 2012-11-11
  Administered 2012-11-10: 8 [IU] via SUBCUTANEOUS
  Administered 2012-11-11: 3 [IU] via SUBCUTANEOUS

## 2012-11-10 MED ORDER — DOXAZOSIN MESYLATE 2 MG PO TABS
2.0000 mg | ORAL_TABLET | Freq: Every day | ORAL | Status: DC
  Administered 2012-11-10: 2 mg via ORAL
  Filled 2012-11-10 (×2): qty 1

## 2012-11-10 MED ORDER — PHENYLEPHRINE HCL 10 MG/ML IJ SOLN
INTRAMUSCULAR | Status: DC | PRN
  Administered 2012-11-10 (×2): 80 ug via INTRAVENOUS
  Administered 2012-11-10: 120 ug via INTRAVENOUS

## 2012-11-10 MED ORDER — METOPROLOL TARTRATE 1 MG/ML IV SOLN
2.0000 mg | INTRAVENOUS | Status: DC | PRN
  Filled 2012-11-10: qty 5

## 2012-11-10 MED ORDER — SODIUM CHLORIDE 0.9 % IV SOLN
INTRAVENOUS | Status: DC
  Administered 2012-11-10 (×3): via INTRAVENOUS

## 2012-11-10 MED ORDER — MIDAZOLAM HCL 5 MG/5ML IJ SOLN
INTRAMUSCULAR | Status: DC | PRN
  Administered 2012-11-10: 1 mg via INTRAVENOUS

## 2012-11-10 MED ORDER — DEXTROSE 5 % IV SOLN
1.5000 g | INTRAVENOUS | Status: AC
  Administered 2012-11-10: 1.5 g via INTRAVENOUS
  Filled 2012-11-10: qty 1.5

## 2012-11-10 SURGICAL SUPPLY — 29 items
BANDAGE ELASTIC 4 VELCRO ST LF (GAUZE/BANDAGES/DRESSINGS) ×2 IMPLANT
BANDAGE GAUZE ELAST BULKY 4 IN (GAUZE/BANDAGES/DRESSINGS) ×2 IMPLANT
CANISTER SUCTION 2500CC (MISCELLANEOUS) ×2 IMPLANT
COVER SURGICAL LIGHT HANDLE (MISCELLANEOUS) ×2 IMPLANT
DRAPE EXTREMITY T 121X128X90 (DRAPE) ×2 IMPLANT
DRSG EMULSION OIL 3X3 NADH (GAUZE/BANDAGES/DRESSINGS) ×2 IMPLANT
ELECT REM PT RETURN 9FT ADLT (ELECTROSURGICAL) ×2
ELECTRODE REM PT RTRN 9FT ADLT (ELECTROSURGICAL) ×1 IMPLANT
GLOVE BIO SURGEON STRL SZ7.5 (GLOVE) ×2 IMPLANT
GLOVE BIOGEL PI IND STRL 6.5 (GLOVE) ×3 IMPLANT
GLOVE BIOGEL PI INDICATOR 6.5 (GLOVE) ×3
GOWN PREVENTION PLUS XLARGE (GOWN DISPOSABLE) ×2 IMPLANT
GOWN STRL NON-REIN LRG LVL3 (GOWN DISPOSABLE) ×6 IMPLANT
KIT BASIN OR (CUSTOM PROCEDURE TRAY) ×2 IMPLANT
KIT ROOM TURNOVER OR (KITS) ×2 IMPLANT
NS IRRIG 1000ML POUR BTL (IV SOLUTION) ×2 IMPLANT
PACK GENERAL/GYN (CUSTOM PROCEDURE TRAY) ×2 IMPLANT
PAD ARMBOARD 7.5X6 YLW CONV (MISCELLANEOUS) ×4 IMPLANT
SPECIMEN JAR SMALL (MISCELLANEOUS) ×2 IMPLANT
SPONGE GAUZE 4X4 12PLY (GAUZE/BANDAGES/DRESSINGS) ×2 IMPLANT
SUT ETHILON 3 0 PS 1 (SUTURE) ×6 IMPLANT
SUT VIC AB 3-0 SH 27 (SUTURE) ×1
SUT VIC AB 3-0 SH 27X BRD (SUTURE) ×1 IMPLANT
SWAB COLLECTION DEVICE MRSA (MISCELLANEOUS) IMPLANT
TOWEL OR 17X24 6PK STRL BLUE (TOWEL DISPOSABLE) ×2 IMPLANT
TOWEL OR 17X26 10 PK STRL BLUE (TOWEL DISPOSABLE) ×2 IMPLANT
TUBE ANAEROBIC SPECIMEN COL (MISCELLANEOUS) IMPLANT
UNDERPAD 30X30 INCONTINENT (UNDERPADS AND DIAPERS) ×2 IMPLANT
WATER STERILE IRR 1000ML POUR (IV SOLUTION) ×2 IMPLANT

## 2012-11-10 NOTE — Consult Note (Signed)
I have seen and examined this patient and agree with the plan of care. Seen post operatively after toe amputation  Jack [name] W 11/10/2012, 4:34 PM

## 2012-11-10 NOTE — Care Management Note (Signed)
   CARE MANAGEMENT NOTE 11/10/2012  Patient:  Jack [name], Jack [name]   Account Number:  [id number]  Date Initiated:  11/10/2012  Documentation initiated by:  Waynesha Rammel  Subjective/Objective Assessment:   Orders for [facility] for wound management post op     Action/Plan:   Contacted [facility] to varify that [facility] would manager this pt as the dressing is a dry dressing.   Anticipated DC Date:     Anticipated DC Plan:  HOME W HOME HEALTH SERVICES         Choice offered to / List presented to:             Status of service:  In process, will continue to follow Medicare Important Message given?   (If response is "NO", the following Medicare IM given date fields will be blank) Date Medicare IM given:   Date Additional Medicare IM given:    Discharge Disposition:  HOME W HOME HEALTH SERVICES  Per UR Regulation:    If discussed at Long Length of Stay Meetings, dates discussed:    Comments:

## 2012-11-10 NOTE — Progress Notes (Addendum)
ANTIBIOTIC CONSULT NOTE - INITIAL  Pharmacy Consult for Zinacef - pharmacy may adjust for renal function Indication: post-op prophylaxis  Allergies  Allergen Reactions  . Lisinopril Cough  . Ivp Dye [Iodinated Diagnostic Agents] Hives  . Morphine And Related Other (See Comments)    Bradycardia states patient    Patient Measurements: Height: 5\' 10"  (177.8 cm) Weight: 193 lb 2 oz (87.6 kg) IBW/kg (Calculated) : 73 Adjusted Body Weight: n/a  Labs:  Recent Labs  11/10/12 0821  WBC 5.9  HGB 11.6*  PLT 324  CREATININE 6.37*    Microbiology:  Medical History: Past Medical History  Diagnosis Date  . Hypertension   . Diabetes mellitus without complication   . Shortness of breath   . Pneumonia     2012  . Heart murmur   . Glaucoma   . Multiple myeloma, without mention of having achieved remission(203.00) 03/30/2012    Cytogenetic neg on 03/23/2012.  . End stage renal disease on dialysis   . Asthma    Assessment: 67 yo male with ESRD admitted for amputation of toes for osteomyelitis.  Patient with orders for Zinacef 1.5g IV x 2 post-op, pharmacy may adjust for renal function.  Due to ESRD, he will only need one post-op dose of Zinacef and this should provide adequate coverage for 24 hrs.  Had one pre-op dose ~ 10 AM  No cultures.   Plan:  1. Zinacef 1.5 g IV x 1 tomorrow at ~ 8AM to keep within 24 hr window. 2. Pharmacy will sign-off, please contact if questions.  Thanks!  Tad Moore, BCPS  Clinical Pharmacist Pager [phone]  11/10/2012 1:25 PM

## 2012-11-10 NOTE — Op Note (Signed)
Procedure: Amputation right first and second toe with resection of metatarsal head  Preoperative diagnosis: Osteomyelitis right first and second toe  Postoperative diagnosis: Same  Anesthesia: Gen.  Specimens: Right first and second toe  Assistant: Nurse  Operative details: After obtaining informed consent, the patient was taken to the operating room. The patient was placed in supine position on the operating room table. After induction of general anesthesia the patient's entire right foot was prepped and draped in usual sterile fashion. A circumferential incision was made at the base of the right first toe. The incision was carried all the way down to the level of the bone. There was some pulsatile bleeding from digital vessels. This was controlled with cautery. The bone was transected with a bone cutter in the midportion of the proximal phalanx. The remainder of the proximal phalanx was debrided away with rongeurs. The metatarsal head was removed with rongeurs.  A similar incision was then made at the base of the second toe.  The incision was carried all the way down to the level of the bone. There was some pulsatile bleeding from digital vessels. This was controlled with cautery. The bone was transected with a bone cutter in the midportion of the proximal phalanx. The remainder of the proximal phalanx was debrided away with rongeurs. The metatarsal head was removed with rongeurs. The wound was thoroughly irrigated with normal saline solution. Hemostasis was obtained. Skin edges were reapproximated using interrupted 3-0  Vertical mattress and simple nylon sutures. A dry sterile dressing was applied. The patient tolerated procedure well and there were no complications. Instrument sponge and needle counts were correct at the end of the case. The patient was taken to recovery in stable condition.   Fabienne Bruns, MD  [facility] [place]  Office: [phone]  Pager:  [phone]

## 2012-11-10 NOTE — Transfer of Care (Signed)
Immediate Anesthesia Transfer of Care Note  Patient: Jack [name]  Procedure(s) Performed: Procedure(s): AMPUTATION FIRST and SECOND TOES Right Foot (Right)  Patient Location: PACU  Anesthesia Type:General  Level of Consciousness: awake, alert  and oriented  Airway & Oxygen Therapy: Patient Spontanous Breathing and Patient connected to nasal cannula oxygen  Post-op Assessment: Report given to PACU RN, Post -op Vital signs reviewed and stable and Patient moving all extremities X 4  Post vital signs: Reviewed and stable  Complications: No apparent anesthesia complications

## 2012-11-10 NOTE — Telephone Encounter (Addendum)
Message copied by Fredrich Birks on Tue Nov 10, 2012  4:36 PM ------      Message from: Melene Plan      Created: Tue Nov 10, 2012 12:55 PM                   ----- Message -----         From: Marlowe Shores, PA-C         Sent: 11/10/2012  11:36 AM           To: Melene Plan, RN, [facility] Admin Pool            2 weeks F/U great toe amp - Fields ------  11/10/12: lm for pt, dpm

## 2012-11-10 NOTE — Preoperative (Signed)
Beta Blockers   Reason not to administer Beta Blockers:Not Applicable 

## 2012-11-10 NOTE — Anesthesia Preprocedure Evaluation (Addendum)
Anesthesia Evaluation  Patient identified by MRN, date of birth, ID band Patient awake    Reviewed: Allergy & Precautions, H&P , NPO status , Patient's Chart, lab work & pertinent test results, reviewed documented beta blocker date and time   Airway Mallampati: II TM Distance: >3 FB Neck ROM: Full    Dental no notable dental hx. (+) Teeth Intact and Dental Advisory Given   Pulmonary neg pulmonary ROS,  breath sounds clear to auscultation  Pulmonary exam normal       Cardiovascular hypertension, On Home Beta Blockers and On Medications + Peripheral Vascular Disease negative cardio ROS  Rhythm:Regular Rate:Normal     Neuro/Psych negative neurological ROS  negative psych ROS   GI/Hepatic negative GI ROS, Neg liver ROS,   Endo/Other  diabetes, Poorly Controlled, Type 2, Oral Hypoglycemic AgentsHypothyroidism   Renal/GU ESRF and DialysisRenal disease  negative genitourinary   Musculoskeletal   Abdominal   Peds  Hematology negative hematology ROS (+) anemia ,   Anesthesia Other Findings   Reproductive/Obstetrics negative OB ROS                        Anesthesia Physical Anesthesia Plan  ASA: III  Anesthesia Plan: General   Post-op Pain Management:    Induction: Intravenous  Airway Management Planned: LMA  Additional Equipment:   Intra-op Plan:   Post-operative Plan: Extubation in OR  Informed Consent: I have reviewed the patients History and Physical, chart, labs and discussed the procedure including the risks, benefits and alternatives for the proposed anesthesia with the patient or authorized representative who has indicated his/her understanding and acceptance.   Dental advisory given and Dental Advisory Given  Plan Discussed with: CRNA  Anesthesia Plan Comments:        Anesthesia Quick Evaluation

## 2012-11-10 NOTE — Interval H&P Note (Signed)
History and Physical Interval Note:  11/10/2012 9:46 AM  Jack [name]  has presented today for surgery, with the diagnosis of PVD WITH ULCERS  The various methods of treatment have been discussed with the patient and family. After consideration of risks, benefits and other options for treatment, the patient has consented to  Procedure(s): AMPUTATION FIRST POSSIBLY SECOND TOE (Right) as a surgical intervention .  The patient's history has been reviewed, patient examined, no change in status, stable for surgery.  I have reviewed the patient's chart and labs.  Questions were answered to the patient's satisfaction.     Francely Craw E

## 2012-11-10 NOTE — Anesthesia Postprocedure Evaluation (Signed)
  Anesthesia Post-op Note  Patient: Jack [name]  Procedure(s) Performed: Procedure(s): AMPUTATION FIRST and SECOND TOES Right Foot (Right)  Patient Location: PACU  Anesthesia Type:General  Level of Consciousness: awake and alert   Airway and Oxygen Therapy: Patient Spontanous Breathing  Post-op Pain: none  Post-op Assessment: Post-op Vital signs reviewed, Patient's Cardiovascular Status Stable and Respiratory Function Stable  Post-op Vital Signs: Reviewed  Filed Vitals:   11/10/12 1215  BP: 107/54  Pulse: 62  Temp: 36.4 C  Resp: 14    Complications: No apparent anesthesia complications

## 2012-11-10 NOTE — Consult Note (Signed)
[facility] Renal Consultation Note    Indication for Consultation:  Management of ESRD/hemodialysis; anemia, hypertension/volume and secondary hyperparathyroidism  HPI: Jack BROUILLET is a 67 y.o. male with ESRD on MWF dialysis at [facility] with HTN, DM, MM off chemo per [facility] recommendations is admitted following the amputation of his right 1st and 2nd toes for osteomyelitis.  He denies, fever, chills, N, V, D, CP or SOB.  He had no difficulty with dialysis on Monday. He has bilateral LE neuropathy with diminished sensation at baseline. He anticipates going home after dialysis on Wednesday.  Past Medical History  Diagnosis Date  . Hypertension   . Diabetes mellitus without complication   . Shortness of breath   . Pneumonia     2012  . Heart murmur   . Glaucoma   . Multiple myeloma, without mention of having achieved remission(203.00) 03/30/2012    Cytogenetic neg on 03/23/2012.  . End stage renal disease on dialysis   . Asthma    Past Surgical History  Procedure Laterality Date  . Thyroidectomy    . Cervical disc surgery    . Eye surgery      CATARACTS  . Insertion of dialysis catheter Right 03/19/2012    Procedure: INSERTION OF DIALYSIS CATHETER;  Surgeon: Pryor Ochoa, MD;  Location: [facility] OR;  Service: Vascular;  Laterality: Right;  Right Internal Jugular  . Av fistula placement Left 03/25/2012    Procedure: ARTERIOVENOUS (AV) FISTULA CREATION;  Surgeon: Pryor Ochoa, MD;  Location: [facility] OR;  Service: Vascular;  Laterality: Left;  . Ligation of competing branches of arteriovenous fistula Left 05/08/2012    Procedure: LIGATION OF COMPETING BRANCHES OF ARTERIOVENOUS FISTULA;  Surgeon: Pryor Ochoa, MD;  Location: [facility] OR;  Service: Vascular;  Laterality: Left;  Ultrasound guided  . Cardiac catheterization      approx 30 years ago   Family History  Problem Relation Age of Onset  . Diabetes Mother   . Cancer Mother     bone   . Kidney disease Father    Social  History:  reports that he quit smoking about 30 years ago. His smoking use included Cigarettes. He smoked 0.00 packs per day. He has never used smokeless tobacco. He reports that he does not drink alcohol or use illicit drugs. Allergies  Allergen Reactions  . Lisinopril Cough  . Ivp Dye [Iodinated Diagnostic Agents] Hives  . Morphine And Related Other (See Comments)    Bradycardia states patient   Prior to Admission medications   Medication Sig Start Date End Date Taking? Authorizing Provider  aspirin EC 81 MG tablet Take 81 mg by mouth daily.   Yes Historical Provider, MD  BYSTOLIC 5 MG tablet Take 5 mg by mouth daily. 09/09/12  Yes Historical Provider, MD  calcitRIOL (ROCALTROL) 0.25 MCG capsule Take 0.25 mcg by mouth daily.   Yes Historical Provider, MD  calcium acetate (PHOSLO) 667 MG capsule Take 667 mg by mouth 3 (three) times daily with meals.  03/24/12  Yes Marinda Elk, MD  cephALEXin (KEFLEX) 500 MG capsule Take 500 mg by mouth 2 (two) times daily. 10/20/12  Yes Richard Ralene Cork, DPM  doxazosin (CARDURA) 2 MG tablet Take 2 mg by mouth at bedtime.   Yes Historical Provider, MD  gabapentin (NEURONTIN) 100 MG capsule Take 100 mg by mouth 2 (two) times daily. Take 1 capsule in the morning and 2 caps at night 09/01/12  Yes Historical Provider, MD  levothyroxine (SYNTHROID, LEVOTHROID)  175 MCG tablet Take 175 mcg by mouth every morning.    Yes Historical Provider, MD  multivitamin (RENA-VIT) TABS tablet Take 1 tablet by mouth at bedtime. 03/24/12  Yes Marinda Elk, MD  saxagliptin HCl (ONGLYZA) 5 MG TABS tablet Take 5 mg by mouth at bedtime.    Yes Historical Provider, MD  silver sulfADIAZINE (SILVADENE) 1 % cream Apply topically 2 (two) times daily. Applied to the affected area of ulcer twice a day following cleansing of wound. Cover with dry gauze dressing. 10/20/12  Yes Alvan Dame, DPM   Current Facility-Administered Medications  Medication Dose Route Frequency Provider Last  Rate Last Dose  . 0.9 %  sodium chloride infusion   Intravenous Continuous Sherren Kerns, MD 10 mL/hr at 11/10/12 [phone]    . ceFAZolin (ANCEF) 1-5 GM-% IVPB           . fentaNYL (SUBLIMAZE) injection 25-50 mcg  25-50 mcg Intravenous Q5 min PRN Rosezella Florida, MD       Labs: Basic Metabolic Panel:  Recent Labs Lab 11/06/12 0747 11/10/12 0821  NA 133* 134*  K 4.0 3.6  CL 96 90*  CO2  --  32  GLUCOSE 446* 305*  BUN 45* 33*  CREATININE 8.80* 6.37*  CALCIUM  --  9.1   Liver Function Tests:  Recent Labs Lab 11/10/12 0821  AST 9  ALT 11  ALKPHOS 74  BILITOT 0.3  PROT 7.6  ALBUMIN 3.4*  CBC:  Recent Labs Lab 11/06/12 0747 11/10/12 0821  WBC  --  5.9  HGB 12.2* 11.6*  HCT 36.0* 35.8*  MCV  --  83.4  PLT  --  324  CBG:  Recent Labs Lab 11/06/12 0930 11/06/12 1026 11/06/12 1547 11/10/12 0851 11/10/12 1121  GLUCAP 377* 313* 324* 287* 254*  Studies/Results: Dg Chest 2 View  11/10/2012   CLINICAL DATA:  Amputation of 1st and possibly 2nd toe. History of hypertension, diabetes and shortness of breath.  EXAM: CHEST  2 VIEW  COMPARISON:  CHEST x-ray 07/24/2012.  FINDINGS: Elevation of the right hemidiaphragm (unchanged). No acute consolidative airspace disease. No pleural effusions. No evidence of pulmonary edema. Heart size is borderline enlarged. Mediastinal contours are unremarkable. Atherosclerosis in the thoracic aorta.  IMPRESSION: 1. No radiographic evidence of acute cardiopulmonary disease. 2. Atherosclerosis. 3. Unchanged elevation of the right hemidiaphragm.   Electronically Signed   By: Trudie Reed M.D.   On: 11/10/2012 08:22   ROS: As per HPI otherwise negative  Physical Exam: Filed Vitals:   11/10/12 0854 11/10/12 1115  BP: 146/67 103/48  Pulse: 50 67  Temp: 97.1 F (36.2 C) 97.7 F (36.5 C)  TempSrc: Oral   Resp: 18 12  Height: 5\' 10"  (1.778 m)   Weight: 87.6 kg (193 lb 2 oz)   SpO2: 100% 99%     General: Well developed, well  nourished, no significant post op pain Head: Normocephalic, atraumatic, sclera non-icteric, mucus membranes are moist Neck: Supple. JVD not elevated. Lungs: Clear bilaterally to auscultation without wheezes, rales, or rhonchi anterioraly  Breathing is unlabored. Heart: RRR with S1 S2. No murmurs, rubs, or gallops appreciated. Abdomen: Soft, non-tender, non-distended with + bowel sounds. M-S:  Strength and tone appear normal for age. Lower extremities: without edema ; right foot wrapped; left no open wounds Neuro: Alert and oriented X 3. Moves all extremities spontaneously. Psych:  Responds to questions appropriately with a normal affect. Dialysis Access: left upper AVF + bruit  Dialysis Orders:  Center:[facility] MWF 4 hr Optiflux 180 400/800 heparin 8700 2 K 2.25 Ca EDW 88 left upper AVF Epo 4600 Hectorol 1 Recent labs:  10/22 Hgb 10.6 - trending down tsat 19% ferritin 541 iPTH 422 usual P 6-7  Assessment/Plan: 1.  Osteo right first and second toe s/p amputation today per Dr. Darrick Penna - hold heparin on dialysis this week 2.  ESRD -  MWF - HD in am, usual orders 3.  Hypertension/volume  - generally gets between 88 and 89 post HD wt;  Usual gains 2-3 3 on novasc 10 and bystolic 5 and doxazosin 2 at HS. BP low now; will review meds when pt gets to the follow and meds are released. 4.  Anemia  - Hgb 11.6 - up due to dialysis yesterday; hold ESA for now; start iron for low tsat 5.  Metabolic bone disease -  Continue hectorol 1 and phoslo 2 ac and 1 with snacks - titrate up - med list not current; not on calcitriol 6.  Nutrition - needs high protein renal diet; last outpt alb dropped from 4s to 3.5 likely due to chronic infection 7.  DM - management per surgery 8.  Hypothyroidism - on levothyroxine 175  Sheffield Slider, PA-C [facility] Beeper [phone] 11/10/2012, 11:58 AM

## 2012-11-10 NOTE — H&P (View-Only) (Signed)
[facility] [place] HISTORY AND PHYSICAL   History of Present Illness:  Patient is a 67 y.o. year old male who presents for evaluation of a right first toe wound. The patient noted breakdown of the skin of his right first toe approximately 2 weeks ago. He denies any trauma. He ambulates with a walker. He was seen by Dr. Powell and sent to [facility]. He was told at [facility] he had poor circulation to his right leg. He was referred here for further evaluation.  Other medical problems include diabetes for 25 years, hypertension, multiple myeloma, end-stage renal disease Monday Wednesday Friday at [facility], asthma all of which are currently controlled. He is a former smoker but quit years ago.  Past Medical History  Diagnosis Date  . Hypertension   . Diabetes mellitus without complication   . Shortness of breath   . Pneumonia     2012  . Heart murmur   . Glaucoma   . Multiple myeloma, without mention of having achieved remission(203.00) 03/30/2012    Cytogenetic neg on 03/23/2012.  . End stage renal disease on dialysis   . Asthma     Past Surgical History  Procedure Laterality Date  . Thyroidectomy    . Cervical disc surgery    . Eye surgery      CATARACTS  . Insertion of dialysis catheter Right 03/19/2012    Procedure: INSERTION OF DIALYSIS CATHETER;  Surgeon: James D Lawson, MD;  Location: [facility] OR;  Service: Vascular;  Laterality: Right;  Right Internal Jugular  . Av fistula placement Left 03/25/2012    Procedure: ARTERIOVENOUS (AV) FISTULA CREATION;  Surgeon: James D Lawson, MD;  Location: [facility] OR;  Service: Vascular;  Laterality: Left;  . Ligation of competing branches of arteriovenous fistula Left 05/08/2012    Procedure: LIGATION OF COMPETING BRANCHES OF ARTERIOVENOUS FISTULA;  Surgeon: James D Lawson, MD;  Location: [facility] OR;  Service: Vascular;  Laterality: Left;  Ultrasound guided    Social History History  Substance Use Topics  . Smoking status:  Former Smoker    Types: Cigarettes    Quit date: 03/19/1982  . Smokeless tobacco: Former User  . Alcohol Use: No    Family History Family History  Problem Relation Age of Onset  . Diabetes Mother   . Cancer Mother     bone   . Kidney disease Father     Allergies  Allergies  Allergen Reactions  . Lisinopril Cough  . Ivp Dye [Iodinated Diagnostic Agents] Hives  . Morphine And Related Other (See Comments)    Bradycardia states patient     Current Outpatient Prescriptions  Medication Sig Dispense Refill  . amLODipine (NORVASC) 10 MG tablet Take 10 mg by mouth every morning.      . aspirin EC 81 MG tablet Take 81 mg by mouth daily.      . BYSTOLIC 5 MG tablet Take 5 mg by mouth daily.      . calcium acetate (PHOSLO) 667 MG capsule Take 667 mg by mouth 2 (two) times daily.      . cephALEXin (KEFLEX) 500 MG capsule Take 1 capsule (500 mg total) by mouth 2 (two) times daily.  20 capsule  1  . dexamethasone (DECADRON) 4 MG tablet       . doxazosin (CARDURA) 2 MG tablet Take 2 mg by mouth at bedtime.      . gabapentin (NEURONTIN) 100 MG capsule Take 100 mg by mouth. Take   1 capsule in the morning and 2 caps at night      . levothyroxine (SYNTHROID, LEVOTHROID) 175 MCG tablet Take 175 mcg by mouth every morning.       . multivitamin (RENA-VIT) TABS tablet Take 1 tablet by mouth at bedtime.  30 tablet  0  . saxagliptin HCl (ONGLYZA) 5 MG TABS tablet Take 5 mg by mouth daily.      . silver sulfADIAZINE (SILVADENE) 1 % cream Apply topically 2 (two) times daily. Applied to the affected area of ulcer twice a day following cleansing of wound. Cover with dry gauze dressing.  50 g  3  . AFLURIA PRESERVATIVE FREE injection       . calcitRIOL (ROCALTROL) 0.25 MCG capsule Take 0.25 mcg by mouth daily.       No current facility-administered medications for this visit.    ROS:   General:  No weight loss, Fever, chills  HEENT: No recent headaches, no nasal bleeding, no visual changes, no  sore throat  Neurologic: No dizziness, blackouts, seizures. No recent symptoms of stroke or mini- stroke. No recent episodes of slurred speech, or temporary blindness.  Cardiac: No recent episodes of chest pain/pressure, no shortness of breath at rest.  + shortness of breath with exertion.  Denies history of atrial fibrillation or irregular heartbeat  Vascular: No history of rest pain in feet.  No history of claudication.  No history of non-healing ulcer, No history of DVT   Pulmonary: No home oxygen, no productive cough, no hemoptysis,  No asthma or wheezing  Musculoskeletal:  [ ] Arthritis, [ ] Low back pain,  [ ] Joint pain  Hematologic:No history of hypercoagulable state.  No history of easy bleeding.  No history of anemia  Gastrointestinal: No hematochezia or melena,  No gastroesophageal reflux, no trouble swallowing  Urinary: [ x] chronic Kidney disease, [x ] on HD - [x ] MWF or [ ] TTHS, [ ] Burning with urination, [ ] Frequent urination, [ ] Difficulty urinating; makes some urine  Skin: No rashes  Psychological: No history of anxiety,  No history of depression   Physical Examination  Filed Vitals:   11/05/12 0945  BP: 95/58  Pulse: 64  Temp: 98.5 F (36.9 C)  TempSrc: Oral  Resp: 16  Height: 5' 10" (1.778 m)  Weight: 198 lb (89.812 kg)  SpO2: 100%    Body mass index is 28.41 kg/(m^2).  General:  Alert and oriented, no acute distress HEENT: Normal Neck: No bruit or JVD Pulmonary: Clear to auscultation bilaterally Cardiac: Regular Rate and Rhythm with 3/6 systolic murmur Abdomen: Soft, non-tender, non-distended, no mass Skin: No rash, early gangrenous changes right first toe and lateral aspect of second toe Extremity Pulses:  2+ radial, brachial, femoral, absent popliteal dorsalis pedis, posterior tibial pulses bilaterally Musculoskeletal: No deformity or edema  Neurologic: Upper and lower extremity motor 5/5 and symmetric  DATA:  Patient had a noninvasive  arterial study today which are reviewed and interpreted. It should calcified vessels bilaterally. Right side with triphasic, left-sided biphasic   ASSESSMENT:  Gangrene right foot with most likely severe calcified atherosclerosis bilateral lower extremities at risk for limb loss right leg.   PLAN:  Aortogram with lower extremity runoff possible intervention tomorrow. He will need contrast premedication due to previous hive reaction. Risk benefits possible complications and procedure details were explained the patient today. He understands and agrees to proceed. We will try to switch his dialysis day to Saturday. However I did   inform the patient that if his potassium is too elevated tomorrow he will need dialysis instead and we will have to reschedule his arteriogram. I also discussed with the patient today that he will need amputation of the right first toe and potentially the second toe regardless of outcome of the arteriogram.  Brittiney Dicostanzo, MD [facility] [place] Office: [phone] Pager: [phone]  For VQI Use Only  PRE-ADM LIVING: Home  AMB STATUS: Ambulatory with Assistance  CAD Sx: None  PRIOR CHF: None  STRESS TEST: [ ] No, [ ] Normal, [ ] + ischemia, [ ] + MI, [ ] Both   

## 2012-11-11 ENCOUNTER — Encounter (HOSPITAL_COMMUNITY): Payer: Self-pay | Admitting: Vascular Surgery

## 2012-11-11 ENCOUNTER — Telehealth: Payer: Self-pay | Admitting: Vascular Surgery

## 2012-11-11 LAB — HEPATITIS B SURFACE ANTIGEN: Hepatitis B Surface Ag: NEGATIVE

## 2012-11-11 LAB — CBC
Hemoglobin: 10.5 g/dL — ABNORMAL LOW (ref 13.0–17.0)
MCH: 27.6 pg (ref 26.0–34.0)
MCHC: 32.5 g/dL (ref 30.0–36.0)
MCV: 84.8 fL (ref 78.0–100.0)
Platelets: 272 10*3/uL (ref 150–400)
RBC: 3.81 MIL/uL — ABNORMAL LOW (ref 4.22–5.81)
WBC: 7 10*3/uL (ref 4.0–10.5)

## 2012-11-11 LAB — RENAL FUNCTION PANEL
Albumin: 3 g/dL — ABNORMAL LOW (ref 3.5–5.2)
CO2: 29 mEq/L (ref 19–32)
Calcium: 8.7 mg/dL (ref 8.4–10.5)
Chloride: 92 mEq/L — ABNORMAL LOW (ref 96–112)
Creatinine, Ser: 8.2 mg/dL — ABNORMAL HIGH (ref 0.50–1.35)
GFR calc non Af Amer: 6 mL/min — ABNORMAL LOW (ref >90)

## 2012-11-11 MED ORDER — OXYCODONE HCL 5 MG PO TABS: 5.0000 mg | ORAL_TABLET | Freq: Four times a day (QID) | ORAL | Status: DC | PRN

## 2012-11-11 MED ORDER — DOXERCALCIFEROL 4 MCG/2ML IV SOLN
INTRAVENOUS | Status: AC
  Administered 2012-11-11: 1 ug via INTRAVENOUS
  Filled 2012-11-11: qty 2

## 2012-11-11 NOTE — Discharge Summary (Signed)
[facility] Discharge Summary   Patient ID:  Jack [name] MRN: [id number] DOB/AGE: Oct 12, 1945 67 y.o.  Admit date: 11/10/2012 Discharge date: 11/11/2012 Date of Surgery: 11/10/2012 Surgeon: Surgeon(s): Sherren Kerns, MD  Admission Diagnosis: PVD WITH ULCERS  Discharge Diagnoses:  PVD WITH ULCERS  Secondary Diagnoses: Past Medical History  Diagnosis Date  . Hypertension   . Diabetes mellitus without complication   . Shortness of breath   . Pneumonia     2012  . Heart murmur   . Glaucoma   . Multiple myeloma, without mention of having achieved remission(203.00) 03/30/2012    Cytogenetic neg on 03/23/2012.  . End stage renal disease on dialysis   . Asthma   . Hyperparathyroidism, secondary renal     Procedure(s): AMPUTATION FIRST and SECOND TOES Right Foot  Discharged Condition: good  HPI: Patient is a 67 y.o. year old male who presents for evaluation of a right first toe wound. The patient noted breakdown of the skin of his right first toe approximately 2 weeks ago. He denies any trauma. He ambulates with a walker. He was seen by Dr. Lowell Guitar and sent to [facility]. He was told at [facility] he had poor circulation to his right leg. He was referred here for further evaluation. Other medical problems include diabetes for 25 years, hypertension, multiple myeloma, end-stage renal disease Monday Wednesday Friday at [employer], asthma all of which are currently controlled. He is a former smoker but quit years ago.  He underwent aortogram with bilateral lower extremity 1 vessel peroneal runoff left three-vessel runoff right but diminutive posterior tibial and peroneal artery primary runoff via the anterior tibial artery, small vessel disease in the foot bilaterally.  11/10/2012 he underwent amputation right first and second toe with resection of metatarsal head.  He will have dialysis today 11/11/2012 and be discharged home afterwards.  We ordered  TID dressing changes with home health and a darco shoe for heel weight bearing.  He will follow 1 month with       Hospital Course:  Jack [name] is a 67 y.o. male is S/P Right Procedure(s): AMPUTATION FIRST and SECOND TOES Right Foot Extubated: POD # 0 Physical exam: dressing intact clean and dry Post-op wounds clean, dry, intact Pt. Ambulating, voiding and taking PO diet without difficulty. Pt pain controlled with PO pain meds. Labs as below Complications:none  Consults:  Treatment Team:  Garnetta Buddy, MD  Significant Diagnostic Studies: CBC Lab Results  Component Value Date   WBC 7.0 11/11/2012   HGB 10.5* 11/11/2012   HCT 32.3* 11/11/2012   MCV 84.8 11/11/2012   PLT 272 11/11/2012    BMET    Component Value Date/Time   NA 134* 11/11/2012 0819   NA 139 09/29/2012 0916   K 3.5 11/11/2012 0819   K 4.0 09/29/2012 0916   CL 92* 11/11/2012 0819   CL 97* 06/10/2012 1050   CO2 29 11/11/2012 0819   CO2 31* 09/29/2012 0916   GLUCOSE 189* 11/11/2012 0819   GLUCOSE 265* 09/29/2012 0916   GLUCOSE 244* 06/10/2012 1050   BUN 42* 11/11/2012 0819   BUN 29.0* 09/29/2012 0916   CREATININE 8.20* 11/11/2012 0819   CREATININE 7.3* 09/29/2012 0916   CALCIUM 8.7 11/11/2012 0819   CALCIUM 9.3 09/29/2012 0916   GFRNONAA 6* 11/11/2012 0819   GFRAA 7* 11/11/2012 0819   COAG Lab Results  Component Value Date   INR 1.01 11/10/2012   INR 3.10*  06/26/2012   INR 0.96 06/10/2012   PROTIME 16.8* 05/20/2012   PROTIME 14.4* 05/13/2012   PROTIME 13.2 05/06/2012     Disposition:  Discharge to :Home Discharge Orders   Future Appointments Provider Department Dept Phone   11/26/2012 9:45 AM Sherren Kerns, MD [facility] [phone]   03/23/2013 1:00 PM [facility] [phone]   03/30/2013 12:00 PM Artis Delay, MD [facility] [phone]   Future Orders Complete By Expires    Call MD for:  redness, tenderness, or signs of infection (pain, swelling, bleeding, redness, odor or green/yellow discharge around incision site)  As directed    Call MD for:  severe or increased pain, loss or decreased feeling  in affected limb(s)  As directed    Call MD for:  temperature >100.5  As directed    Discharge patient  As directed    Comments:     Discharge pt to home   Discharge wound care:  As directed    Comments:     RN to change dressing - home health   Increase activity slowly  As directed    Comments:     Walk with assistance use walker or cane as needed   Resume previous diet  As directed        Medication List         aspirin EC 81 MG tablet  Take 81 mg by mouth daily.     BYSTOLIC 5 MG tablet  Generic drug:  nebivolol  Take 5 mg by mouth daily.     calcitRIOL 0.25 MCG capsule  Commonly known as:  ROCALTROL  Take 0.25 mcg by mouth daily.     calcium acetate 667 MG capsule  Commonly known as:  PHOSLO  Take 667 mg by mouth 3 (three) times daily with meals.     cephALEXin 500 MG capsule  Commonly known as:  KEFLEX  Take 500 mg by mouth 2 (two) times daily.     doxazosin 2 MG tablet  Commonly known as:  CARDURA  Take 2 mg by mouth at bedtime.     gabapentin 100 MG capsule  Commonly known as:  NEURONTIN  Take 100 mg by mouth 2 (two) times daily. Take 1 capsule in the morning and 2 caps at night     levothyroxine 175 MCG tablet  Commonly known as:  SYNTHROID, LEVOTHROID  Take 175 mcg by mouth every morning.     multivitamin Tabs tablet  Take 1 tablet by mouth at bedtime.     ONGLYZA 5 MG Tabs tablet  Generic drug:  saxagliptin HCl  Take 5 mg by mouth at bedtime.     oxyCODONE 5 MG immediate release tablet  Commonly known as:  Oxy IR/ROXICODONE  Take 1 tablet (5 mg total) by mouth every 6 (six) hours as needed for pain.     silver sulfADIAZINE 1 % cream  Commonly known as:  SILVADENE  Apply topically 2 (two) times daily. Applied to the  affected area of ulcer twice a day following cleansing of wound. Cover with dry gauze dressing.       Verbal and written Discharge instructions given to the patient. Wound care per Discharge AVS     Follow-up Information   Follow up with Sherren Kerns, MD In 2 weeks. (office will arrange-sent)    Specialty:  Vascular Surgery   Contact information:   [address] [place] [address]  [phone]       Signed: Clinton Gallant [facility] 11/11/2012, 9:10 AM

## 2012-11-11 NOTE — Progress Notes (Signed)
[facility] [place]  Subjective  - No pain, feels ok on dialysis   Objective 115/58 53 98.2 F (36.8 C) (Oral) 18 96%  Intake/Output Summary (Last 24 hours) at 11/11/12 0842 Last data filed at 11/10/12 2217  Gross per 24 hour  Intake   1900 ml  Output    100 ml  Net   1800 ml   Right foot dressing intact  Assessment/Planning: S/p amputation of toes 1 and 2 D/c home today Darco shoe Wt bearing heel only Home health visits  Follow up 1 month  Jack [name] E 11/11/2012 8:42 AM --  Laboratory Lab Results:  Recent Labs  11/10/12 0821  WBC 5.9  HGB 11.6*  HCT 35.8*  PLT 324   BMET  Recent Labs  11/10/12 0821  NA 134*  K 3.6  CL 90*  CO2 32  GLUCOSE 305*  BUN 33*  CREATININE 6.37*  CALCIUM 9.1    COAG Lab Results  Component Value Date   INR 1.01 11/10/2012   INR 3.10* 06/26/2012   INR 0.96 06/10/2012   PROTIME 16.8* 05/20/2012   PROTIME 14.4* 05/13/2012   PROTIME 13.2 05/06/2012   No results found for this basename: PTT

## 2012-11-11 NOTE — Progress Notes (Signed)
Orthopedic Tech Progress Note Patient Details:  Jack [name] [birth year] [id number]  Ortho Devices Type of Ortho Device: Darco shoe Ortho Device/Splint Location: rle Ortho Device/Splint Interventions: Application   Charlotta Lapaglia 11/11/2012, 11:14 AM

## 2012-11-11 NOTE — Progress Notes (Signed)
   CARE MANAGEMENT NOTE 11/11/2012  Patient:  Jack [name], Jack [name]   Account Number:  [id number]  Date Initiated:  11/10/2012  Documentation initiated by:  ROYAL,CHERYL  Subjective/Objective Assessment:   Orders for [facility] for wound management post op     Action/Plan:   Contacted [facility] to varify that [facility] would manager this pt as the dressing is a dry dressing.   Anticipated DC Date:  11/11/2012   Anticipated DC Plan:  HOME W HOME HEALTH SERVICES      DC Planning Services  CM consult      Choice offered to / List presented to:          [facility] arranged  HH-1 RN      Status of service:  Completed, signed off Medicare Important Message given?   (If response is "NO", the following Medicare IM given date fields will be blank) Date Medicare IM given:   Date Additional Medicare IM given:    Discharge Disposition:  HOME W HOME HEALTH SERVICES  Per UR Regulation:    If discussed at Long Length of Stay Meetings, dates discussed:    Comments:  11/11/2012  1500 Darlyne Russian RN, [place]  [phone] CM referral:  HHRN                      DME:  RW Met with patient regarding discharge plans, he has a RW at home, verified [facility] to f/u for dressing change  [facility] called with plan dc today.

## 2012-11-11 NOTE — Procedures (Signed)
I have seen and examined this patient and agree with the plan of care .  Dyan Labarbera W 11/11/2012, 9:31 AM

## 2012-11-11 NOTE — Telephone Encounter (Addendum)
Message copied by Fredrich Birks on Wed Nov 11, 2012  3:13 PM ------      Message from: Melene Plan      Created: Wed Nov 11, 2012  1:58 PM                   ----- Message -----         From: Lars Mage, PA-C         Sent: 11/11/2012  11:49 AM           To: Melene Plan, RN            F/U in 4 weeks not 2 weeks per Dr. Darrick Penna. ------  11/11/12: left detailed message for pt on home # with NEW appt date of 12/17/12, dpm

## 2012-11-14 ENCOUNTER — Emergency Department (HOSPITAL_COMMUNITY)
Admission: EM | Admit: 2012-11-14 | Discharge: 2012-11-15 | Disposition: A | Discharge status: Home / Self Care | Payer: Medicare Other | Attending: Emergency Medicine | Admitting: Emergency Medicine

## 2012-11-14 ENCOUNTER — Emergency Department (HOSPITAL_COMMUNITY): Payer: Medicare Other

## 2012-11-14 ENCOUNTER — Encounter (HOSPITAL_COMMUNITY): Payer: Self-pay | Admitting: Emergency Medicine

## 2012-11-14 DIAGNOSIS — Z7982 Long term (current) use of aspirin: Secondary | ICD-10-CM | POA: Insufficient documentation

## 2012-11-14 DIAGNOSIS — Z862 Personal history of diseases of the blood and blood-forming organs and certain disorders involving the immune mechanism: Secondary | ICD-10-CM | POA: Insufficient documentation

## 2012-11-14 DIAGNOSIS — Z8669 Personal history of other diseases of the nervous system and sense organs: Secondary | ICD-10-CM | POA: Insufficient documentation

## 2012-11-14 DIAGNOSIS — Z8701 Personal history of pneumonia (recurrent): Secondary | ICD-10-CM | POA: Insufficient documentation

## 2012-11-14 DIAGNOSIS — J45901 Unspecified asthma with (acute) exacerbation: Secondary | ICD-10-CM | POA: Insufficient documentation

## 2012-11-14 DIAGNOSIS — Z87891 Personal history of nicotine dependence: Secondary | ICD-10-CM | POA: Insufficient documentation

## 2012-11-14 DIAGNOSIS — F29 Unspecified psychosis not due to a substance or known physiological condition: Secondary | ICD-10-CM | POA: Insufficient documentation

## 2012-11-14 DIAGNOSIS — E119 Type 2 diabetes mellitus without complications: Secondary | ICD-10-CM | POA: Insufficient documentation

## 2012-11-14 DIAGNOSIS — Z79899 Other long term (current) drug therapy: Secondary | ICD-10-CM | POA: Insufficient documentation

## 2012-11-14 DIAGNOSIS — Z9861 Coronary angioplasty status: Secondary | ICD-10-CM | POA: Insufficient documentation

## 2012-11-14 DIAGNOSIS — Y849 Medical procedure, unspecified as the cause of abnormal reaction of the patient, or of later complication, without mention of misadventure at the time of the procedure: Secondary | ICD-10-CM | POA: Insufficient documentation

## 2012-11-14 DIAGNOSIS — R5381 Other malaise: Secondary | ICD-10-CM | POA: Insufficient documentation

## 2012-11-14 DIAGNOSIS — I120 Hypertensive chronic kidney disease with stage 5 chronic kidney disease or end stage renal disease: Secondary | ICD-10-CM | POA: Insufficient documentation

## 2012-11-14 DIAGNOSIS — Z992 Dependence on renal dialysis: Secondary | ICD-10-CM | POA: Insufficient documentation

## 2012-11-14 DIAGNOSIS — R011 Cardiac murmur, unspecified: Secondary | ICD-10-CM | POA: Insufficient documentation

## 2012-11-14 DIAGNOSIS — N186 End stage renal disease: Secondary | ICD-10-CM | POA: Insufficient documentation

## 2012-11-14 DIAGNOSIS — T8140XA Infection following a procedure, unspecified, initial encounter: Secondary | ICD-10-CM | POA: Insufficient documentation

## 2012-11-14 LAB — BASIC METABOLIC PANEL
BUN: 32 mg/dL — ABNORMAL HIGH (ref 6–23)
CO2: 30 mEq/L (ref 19–32)
Calcium: 9.1 mg/dL (ref 8.4–10.5)
Creatinine, Ser: 6.88 mg/dL — ABNORMAL HIGH (ref 0.50–1.35)
GFR calc Af Amer: 8 mL/min — ABNORMAL LOW (ref >90)
GFR calc non Af Amer: 7 mL/min — ABNORMAL LOW (ref >90)
Glucose, Bld: 251 mg/dL — ABNORMAL HIGH (ref 70–99)

## 2012-11-14 LAB — CBC WITH DIFFERENTIAL/PLATELET
Basophils Absolute: 0 10*3/uL (ref 0.0–0.1)
Basophils Relative: 0 % (ref 0–1)
Eosinophils Absolute: 0.1 10*3/uL (ref 0.0–0.7)
Eosinophils Relative: 1 % (ref 0–5)
MCH: 27.1 pg (ref 26.0–34.0)
MCHC: 31.4 g/dL (ref 30.0–36.0)
MCV: 86.3 fL (ref 78.0–100.0)
Monocytes Relative: 14 % — ABNORMAL HIGH (ref 3–12)
Neutro Abs: 6.8 10*3/uL (ref 1.7–7.7)
Neutrophils Relative %: 72 % (ref 43–77)
Platelets: 234 10*3/uL (ref 150–400)
RDW: 14.5 % (ref 11.5–15.5)

## 2012-11-14 NOTE — ED Notes (Signed)
Per EMS, pt had surgery x3 days ago on right foot. Pt. Now has fever and family states alerted loc with weakness.

## 2012-11-14 NOTE — ED Provider Notes (Signed)
CSN: [id number]     Arrival date & time 11/14/12  2036 History   First MD Initiated Contact with Patient 11/14/12 2057     Chief Complaint  Patient presents with  . Fever  . Weakness   (Consider location/radiation/quality/duration/timing/severity/associated sxs/prior Treatment) Patient is a 67 y.o. male presenting with fever and weakness. The history is provided by the patient and a relative. No language interpreter was used.  Fever Max temp prior to arrival:  101 Temp source:  Oral Onset quality:  Sudden Duration:  1 day Timing:  Intermittent Progression:  Waxing and waning Chronicity:  New Worsened by:  Nothing tried Ineffective treatments:  None tried Associated symptoms: chills and confusion   Associated symptoms: no chest pain, no congestion, no cough, no diarrhea, no dysuria, no headaches, no nausea, no rash, no rhinorrhea, no sore throat and no vomiting   Associated symptoms comment:  Increased R foot pain Risk factors: immunosuppression   Risk factors comment:  Toe amputation 3 days ago Weakness Pertinent negatives include no chest pain, no abdominal pain, no headaches and no shortness of breath.    Past Medical History  Diagnosis Date  . Hypertension   . Diabetes mellitus without complication   . Shortness of breath   . Pneumonia     2012  . Heart murmur   . Glaucoma   . Multiple myeloma, without mention of having achieved remission(203.00) 03/30/2012    Cytogenetic neg on 03/23/2012.  . End stage renal disease on dialysis   . Asthma   . Hyperparathyroidism, secondary renal    Past Surgical History  Procedure Laterality Date  . Thyroidectomy    . Cervical disc surgery    . Eye surgery      CATARACTS  . Insertion of dialysis catheter Right 03/19/2012    Procedure: INSERTION OF DIALYSIS CATHETER;  Surgeon: Pryor Ochoa, MD;  Location: [facility] OR;  Service: Vascular;  Laterality: Right;  Right Internal Jugular  . Av fistula placement Left 03/25/2012    Procedure:  ARTERIOVENOUS (AV) FISTULA CREATION;  Surgeon: Pryor Ochoa, MD;  Location: [facility] OR;  Service: Vascular;  Laterality: Left;  . Ligation of competing branches of arteriovenous fistula Left 05/08/2012    Procedure: LIGATION OF COMPETING BRANCHES OF ARTERIOVENOUS FISTULA;  Surgeon: Pryor Ochoa, MD;  Location: [facility] OR;  Service: Vascular;  Laterality: Left;  Ultrasound guided  . Cardiac catheterization      approx 30 years ago  . Amputation Right 11/10/2012    Procedure: AMPUTATION FIRST and SECOND TOES Right Foot;  Surgeon: Sherren Kerns, MD;  Location: [facility] OR;  Service: Vascular;  Laterality: Right;   Family History  Problem Relation Age of Onset  . Diabetes Mother   . Cancer Mother     bone   . Kidney disease Father    History  Substance Use Topics  . Smoking status: Former Smoker    Types: Cigarettes    Quit date: 03/19/1982  . Smokeless tobacco: Never Used  . Alcohol Use: No     Comment: formerly    Review of Systems  Constitutional: Positive for fever and chills. Negative for activity change, appetite change and fatigue.  HENT: Negative for congestion, facial swelling, rhinorrhea, sore throat and trouble swallowing.   Eyes: Negative for photophobia and pain.  Respiratory: Negative for cough, chest tightness and shortness of breath.   Cardiovascular: Negative for chest pain and leg swelling.  Gastrointestinal: Negative for nausea, vomiting, abdominal pain, diarrhea and constipation.  Endocrine: Negative for polydipsia and polyuria.  Genitourinary: Negative for dysuria, urgency, decreased urine volume and difficulty urinating.  Musculoskeletal: Negative for back pain and gait problem.  Skin: Negative for color change, rash and wound.  Allergic/Immunologic: Negative for immunocompromised state.  Neurological: Positive for weakness. Negative for dizziness, facial asymmetry, speech difficulty, numbness and headaches.  Psychiatric/Behavioral: Positive for confusion. Negative for  decreased concentration and agitation.    Allergies  Lisinopril; Ivp dye; and Morphine and related  Home Medications   Current Outpatient Rx  Name  Route  Sig  Dispense  Refill  . aspirin EC 81 MG tablet   Oral   Take 81 mg by mouth daily.         Marland Kitchen b complex-vitamin c-folic acid (NEPHRO-VITE) 0.8 MG TABS tablet   Oral   Take 1 tablet by mouth daily.         Marland Kitchen BYSTOLIC 5 MG tablet   Oral   Take 5 mg by mouth daily.         . calcium acetate (PHOSLO) 667 MG capsule   Oral   Take 667 mg by mouth 3 (three) times daily with meals.          . cephALEXin (KEFLEX) 500 MG capsule   Oral   Take 500 mg by mouth 2 (two) times daily.         Marland Kitchen doxazosin (CARDURA) 2 MG tablet   Oral   Take 2 mg by mouth at bedtime.         . gabapentin (NEURONTIN) 100 MG capsule   Oral   Take 100-200 mg by mouth 2 (two) times daily. Take 1 capsule in the morning and 2 caps at night         . levothyroxine (SYNTHROID, LEVOTHROID) 175 MCG tablet   Oral   Take 175 mcg by mouth every morning.          Marland Kitchen oxyCODONE (OXY IR/ROXICODONE) 5 MG immediate release tablet   Oral   Take 1 tablet (5 mg total) by mouth every 6 (six) hours as needed for pain.   30 tablet   0   . saxagliptin HCl (ONGLYZA) 5 MG TABS tablet   Oral   Take 5 mg by mouth at bedtime.           BP 138/68  Pulse 68  Temp(Src) 98.3 F (36.8 C) (Oral)  SpO2 94% Physical Exam  Constitutional: He is oriented to person, place, and time. He appears well-developed and well-nourished. No distress.  HENT:  Head: Normocephalic and atraumatic.  Mouth/Throat: No oropharyngeal exudate.  Eyes: Pupils are equal, round, and reactive to light.  Neck: Normal range of motion. Neck supple.  Cardiovascular: Normal rate, regular rhythm and normal heart sounds.  Exam reveals no gallop and no friction rub.   No murmur heard. Pulmonary/Chest: Effort normal and breath sounds normal. No respiratory distress. He has no wheezes. He  has no rales.  Abdominal: Soft. Bowel sounds are normal. He exhibits no distension and no mass. There is no tenderness. There is no rebound and no guarding.  Musculoskeletal: Normal range of motion. He exhibits no edema and no tenderness.       Feet:  Neurological: He is alert and oriented to person, place, and time.  Skin: Skin is warm and dry.  Psychiatric: He has a normal mood and affect.    ED Course  Procedures (including critical care time) Labs Review Labs Reviewed  BASIC METABOLIC PANEL -  Abnormal; Notable for the following:    Sodium 133 (*)    Chloride 92 (*)    Glucose, Bld 251 (*)    BUN 32 (*)    Creatinine, Ser 6.88 (*)    GFR calc non Af Amer 7 (*)    GFR calc Af Amer 8 (*)    All other components within normal limits  CBC WITH DIFFERENTIAL - Abnormal; Notable for the following:    RBC 3.80 (*)    Hemoglobin 10.3 (*)    HCT 32.8 (*)    Monocytes Relative 14 (*)    Monocytes Absolute 1.3 (*)    All other components within normal limits  CULTURE, BLOOD (ROUTINE X 2)  CULTURE, BLOOD (ROUTINE X 2)  URINE CULTURE  URINALYSIS, ROUTINE W REFLEX MICROSCOPIC   Imaging Review Dg Foot Complete Right  11/14/2012   CLINICAL DATA:  Postop fever. Cellulitis. Recent toe amputations.  EXAM: RIGHT FOOT COMPLETE - 3+ VIEW  COMPARISON:  10/20/2012  FINDINGS: Interval 1st and 2nd toe amputations. There may have been osteotomies involving the metatarsal head as well. Small ill-defined bone fragments present at the amputation margins. No evidence of osteomyelitis involving the 3rd through 5th digits. No midfoot or hindfoot acute abnormality, including fracture. Extensive arterial calcification.  IMPRESSION: Interval 1st and 2nd toe amputations. Bone fragmentation at the osteotomies could be postsurgical or from osteomyelitis.   Electronically Signed   By: Tiburcio Pea M.D.   On: 11/14/2012 23:21    EKG Interpretation   None       MDM   1. Cellulitis, wound, post-operative,  initial encounter    Pt is a 67 y.o. male with Pmhx as above who presents with 1 day of fever, inc foot pain at site of toe amputations 3 days ago.  Pt also states he has been feeling somewhat disoriented.  On PE, VSS, afebrile here but was 101 per daughter by EMS at home.  Denies respiratory, GI, or GU symptoms. He has erythema surrounding surgical site w/ streaking of anterior foot.  Sutures have mild dehiscence, no purulent drainage.  WBC nml, XR w/ fragmentation at osteotomies (post-surgical vs osteomyelitis).  Dr. Hart Rochester w/ vascular surgery will come to ED to see pt.   12:22 PM Dr. Hart Rochester has seen pt in ED.  Believes he is safe for vancomycin to be dosed by dialysis center.  Will given first dose here.  Vascular office will assist with arranging and f/u for vancomycin.  Will d/c home with Return precautions given for new or worsening symptoms including worsening redness, fever, pain.  Pt will be seen by his primary surgeon, Dr. Darrick Penna in 5 days [facility] Nov 7th).         Shanna Cisco, MD 11/15/12 [phone]

## 2012-11-14 NOTE — ED Notes (Signed)
Pt states that he had two toes on his right foot amputated on Tuesday, October 28. Pt states that he has been feeling "off" of late, not able to pinpoint anything specifically wrong, but reports "just not feeling well". Pt's visitor reports that the pt was "delusional" earlier today when he had a low-grade fever. Pt is alert and oriented at this time. Pt reports taking his prescribed oxycodone around 1600 this PM.

## 2012-11-15 MED ORDER — VANCOMYCIN HCL IN DEXTROSE 1-5 GM/200ML-% IV SOLN: 1000.0000 mg | Freq: Once | INTRAVENOUS | Status: DC

## 2012-11-15 MED ORDER — VANCOMYCIN HCL IN DEXTROSE 1-5 GM/200ML-% IV SOLN
1000.0000 mg | Freq: Once | INTRAVENOUS | Status: AC
  Administered 2012-11-15: 1000 mg via INTRAVENOUS
  Filled 2012-11-15: qty 200

## 2012-11-15 NOTE — Consult Note (Signed)
Vascular Surgery Consultation  Reason for Consult: Possible infection right first and second toe amputation  HPI: Jack [name] is a 67 y.o. male who presents for evaluation of possible infection right first and second  toe amputation. This patient had angiography performed by Fabienne Bruns on October 24 which revealed no total occlusion which would be improved by bypass. He was found to have severe tibial occlusive disease with in-line flow through the peroneal artery to the foot. Patient presented with ulceration of the right first and second toe. On October 28 patient had right first and second toe amputation performed Dr. Darrick Penna. Came to the emergency department today with history of some discomfort in the toe amputation site low-grade temperature.   Past Medical History  Diagnosis Date  . Hypertension   . Diabetes mellitus without complication   . Shortness of breath   . Pneumonia     2012  . Heart murmur   . Glaucoma   . Multiple myeloma, without mention of having achieved remission(203.00) 03/30/2012    Cytogenetic neg on 03/23/2012.  . End stage renal disease on dialysis   . Asthma   . Hyperparathyroidism, secondary renal    Past Surgical History  Procedure Laterality Date  . Thyroidectomy    . Cervical disc surgery    . Eye surgery      CATARACTS  . Insertion of dialysis catheter Right 03/19/2012    Procedure: INSERTION OF DIALYSIS CATHETER;  Surgeon: Pryor Ochoa, MD;  Location: [facility] OR;  Service: Vascular;  Laterality: Right;  Right Internal Jugular  . Av fistula placement Left 03/25/2012    Procedure: ARTERIOVENOUS (AV) FISTULA CREATION;  Surgeon: Pryor Ochoa, MD;  Location: [facility] OR;  Service: Vascular;  Laterality: Left;  . Ligation of competing branches of arteriovenous fistula Left 05/08/2012    Procedure: LIGATION OF COMPETING BRANCHES OF ARTERIOVENOUS FISTULA;  Surgeon: Pryor Ochoa, MD;  Location: [facility] OR;  Service: Vascular;  Laterality: Left;  Ultrasound  guided  . Cardiac catheterization      approx 30 years ago  . Amputation Right 11/10/2012    Procedure: AMPUTATION FIRST and SECOND TOES Right Foot;  Surgeon: Sherren Kerns, MD;  Location: [facility] OR;  Service: Vascular;  Laterality: Right;   History   Social History  . Marital Status: Divorced    Spouse Name: N/A    Number of Children: N/A  . Years of Education: N/A   Social History Main Topics  . Smoking status: Former Smoker    Types: Cigarettes    Quit date: 03/19/1982  . Smokeless tobacco: Never Used  . Alcohol Use: No     Comment: formerly  . Drug Use: No  . Sexual Activity: No   Other Topics Concern  . None   Social History Narrative  . None   Family History  Problem Relation Age of Onset  . Diabetes Mother   . Cancer Mother     bone   . Kidney disease Father    Allergies  Allergen Reactions  . Lisinopril Cough  . Ivp Dye [Iodinated Diagnostic Agents] Hives  . Morphine And Related Other (See Comments)    Bradycardia states patient   Prior to Admission medications   Medication Sig Start Date End Date Taking? Authorizing Provider  aspirin EC 81 MG tablet Take 81 mg by mouth daily.   Yes Historical Provider, MD  b complex-vitamin c-folic acid (NEPHRO-VITE) 0.8 MG TABS tablet Take 1 tablet by mouth daily.  Yes Historical Provider, MD  BYSTOLIC 5 MG tablet Take 5 mg by mouth daily. 09/09/12  Yes Historical Provider, MD  calcium acetate (PHOSLO) 667 MG capsule Take 667 mg by mouth 3 (three) times daily with meals.  03/24/12  Yes Marinda Elk, MD  cephALEXin (KEFLEX) 500 MG capsule Take 500 mg by mouth 2 (two) times daily. 10/20/12  Yes Richard Ralene Cork, DPM  doxazosin (CARDURA) 2 MG tablet Take 2 mg by mouth at bedtime.   Yes Historical Provider, MD  gabapentin (NEURONTIN) 100 MG capsule Take 100-200 mg by mouth 2 (two) times daily. Take 1 capsule in the morning and 2 caps at night 09/01/12  Yes Historical Provider, MD  levothyroxine (SYNTHROID, LEVOTHROID) 175  MCG tablet Take 175 mcg by mouth every morning.    Yes Historical Provider, MD  oxyCODONE (OXY IR/ROXICODONE) 5 MG immediate release tablet Take 1 tablet (5 mg total) by mouth every 6 (six) hours as needed for pain. 11/11/12  Yes Lars Mage, PA-C  saxagliptin HCl (ONGLYZA) 5 MG TABS tablet Take 5 mg by mouth at bedtime.    Yes Historical Provider, MD     Positive ROS:   All other systems have been reviewed and were otherwise negative with the exception of those mentioned in the HPI and as above.  Physical Exam: Filed Vitals:   11/14/12 2052  BP: 138/68  Pulse: 68  Temp: 98.3 F (36.8 C)    General: Alert, no acute distress HEENT: Normal for age Cardiovascular: Regular rate and rhythm. Carotid pulses 2+, no bruits audible Respiratory: Clear to auscultation. No cyanosis, no use of accessory musculature GI: No organomegaly, abdomen is soft and non-tender Skin: No lesions in the area of chief complaint Neurologic: Sensation intact distally Psychiatric: Patient is competent for consent with normal mood and affect Musculoskeletal: No obvious deformities Extremities: Right leg with 3+ femoral and 2+ popliteal pulse palpable. Right first and toe amputation site intact. Mild erythema on the dorsal aspect of foot adjacent to amputation but incision is intact with no purulent drainage. No fluctuance or proximal erythema.  Labs reviewed: White blood count normal     Assessment/Plan:  Status post right first and toe amputation 5 days ago per Dr. Leonette Most fields in patient with diabetes mellitus, end-stage renal disease, and severe tibial occlusive disease which reveals Infoline flow to the foot through peroneal artery. Indication site is intact with no definite evidence of infection. If this fails to heal patient will likely require right below-knee amputation.  Plan vancomycin 1 g IV today in emergency department and again on Monday and Wednesday in dialysis unit Patient see Dr.  Darrick Penna on Thursday, November 7 for further followup unless develops significant fever or proximal erythem Discussed this with patient and he is agreeable   Josephina Gip, MD 11/15/2012 12:19 AM

## 2012-11-15 NOTE — ED Notes (Signed)
This RN dressed the pt's surgical site with 4x4 sterile guaze and wrapped the area with Kerlex.

## 2012-11-15 NOTE — ED Notes (Signed)
Hart Rochester, MD at bedside.

## 2012-11-16 ENCOUNTER — Telehealth: Payer: Self-pay | Admitting: *Deleted

## 2012-11-16 ENCOUNTER — Telehealth: Payer: Self-pay | Admitting: Vascular Surgery

## 2012-11-16 NOTE — Telephone Encounter (Signed)
DIscussed with Pam,RN at [facility] that Dr Hart Rochester wanted Mr Jack [name] to have 1gm Vancomycin with HD this Monday & Wednesday. Sent a message to front desk to make him an appt to see Dr Darrick Penna 11/19/12.

## 2012-11-16 NOTE — Telephone Encounter (Addendum)
Message copied by Fredrich Birks on Mon Nov 16, 2012 11:56 AM ------      Message from: Melene Plan      Created: Mon Nov 16, 2012  9:33 AM       Please make him an appt to see Dr Darrick Penna this Thursday 11/19/12 per Dr Kipp Brood.      Thanks      Darel Hong ------  11/16/12: lm for pt to cb for appt info, dpm

## 2012-11-18 ENCOUNTER — Encounter: Payer: Self-pay | Admitting: Vascular Surgery

## 2012-11-19 ENCOUNTER — Other Ambulatory Visit: Payer: Self-pay

## 2012-11-19 ENCOUNTER — Encounter: Payer: Self-pay | Admitting: Vascular Surgery

## 2012-11-19 ENCOUNTER — Ambulatory Visit (INDEPENDENT_AMBULATORY_CARE_PROVIDER_SITE_OTHER): Payer: Medicare Other | Admitting: Vascular Surgery

## 2012-11-19 VITALS — BP 109/60 | HR 54 | Temp 98.1°F | Ht 70.0 in | Wt 196.0 lb

## 2012-11-19 DIAGNOSIS — I739 Peripheral vascular disease, unspecified: Secondary | ICD-10-CM

## 2012-11-19 HISTORY — DX: Peripheral vascular disease, unspecified: I73.9

## 2012-11-19 NOTE — Progress Notes (Signed)
Patient is a 67 year old male status post amputation of toes 1 and 2 right foot on October 29. He returns today for further followup. He denies any drainage from the foot. He has no significant pain. He was noted to have some redness around the incision by my partner Dr. Hart Rochester earlier this week. This was also noted by his home health nurse. He was started on vancomycin on dialysis at that time. He denies any fever or chills.  Physical exam:  Filed Vitals:   11/19/12 1552  BP: 109/60  Pulse: 54  Temp: 98.1 F (36.7 C)  TempSrc: Oral  Height: 5\' 10"  (1.778 m)  Weight: 196 lb (88.905 kg)  SpO2: 100%    Right first and second toe amputation sites skin approximated some erythema extending from the skin edge approximately 2 cm of the foot difficult to tell whether or not this is ecchymosis and trauma from his recent operation versus cellulitis, no obvious abscess no fluctuance  Assessment: Marginal healing toe amputation site but too early to tell if this will fail.  Plan: Continue IV antibiotics for one more week. Follow up with me in one week. If wounds continued to deteriorate we'll need to consider whether or not to do a below-knee amputation.  Fabienne Bruns, MD [facility] Office: [phone] Pager: [phone]

## 2012-11-21 LAB — CULTURE, BLOOD (ROUTINE X 2)

## 2012-11-25 ENCOUNTER — Encounter: Payer: Self-pay | Admitting: Vascular Surgery

## 2012-11-26 ENCOUNTER — Encounter: Payer: Medicare Other | Admitting: Vascular Surgery

## 2012-11-26 ENCOUNTER — Ambulatory Visit (INDEPENDENT_AMBULATORY_CARE_PROVIDER_SITE_OTHER): Payer: Medicare Other | Admitting: Vascular Surgery

## 2012-11-26 ENCOUNTER — Encounter: Payer: Self-pay | Admitting: Vascular Surgery

## 2012-11-26 VITALS — BP 123/58 | HR 60 | Temp 98.1°F | Ht 70.0 in | Wt 199.0 lb

## 2012-11-26 DIAGNOSIS — I739 Peripheral vascular disease, unspecified: Secondary | ICD-10-CM

## 2012-11-26 NOTE — Progress Notes (Signed)
Patient is a 67 year old male status post amputation of toes 1 and 2 right foot on October 29. He returns today for further followup. He has had some serous drainage from the amputation site. He has no significant pain. He had some erythema around the incision recently. He was started on vancomycin on dialysis at that time. He denies any fever or chills.  Physical exam:     Filed Vitals:   11/26/12 1533  BP: 123/58  Pulse: 60  Temp: 98.1 F (36.7 C)  TempSrc: Oral  Height: 5\' 10"  (1.778 m)  Weight: 199 lb (90.266 kg)  SpO2: 100%   Right first and second toe amputation sites skin approximated, maceration of the central portion of the incision some erythema extending from the skin edge approximately 2 cm of the foot difficult to tell whether or not this is ecchymosis and trauma from his recent operation versus cellulitis this is unchanged from his recent visit to, no obvious abscess no fluctuance  Assessment: Marginal healing toe amputation site but too early to tell if this will fail.  Plan: Follow up with me in one week. If wounds continued to deteriorate we'll need to consider whether or not to do a below-knee amputation.  Fabienne Bruns, MD [facility] Office: [phone] Pager: [phone]

## 2012-12-02 ENCOUNTER — Encounter: Payer: Self-pay | Admitting: Vascular Surgery

## 2012-12-03 ENCOUNTER — Encounter: Payer: Self-pay | Admitting: Vascular Surgery

## 2012-12-03 ENCOUNTER — Ambulatory Visit (INDEPENDENT_AMBULATORY_CARE_PROVIDER_SITE_OTHER): Payer: Medicare Other | Admitting: Vascular Surgery

## 2012-12-03 VITALS — BP 137/69 | HR 70 | Ht 70.0 in | Wt 199.8 lb

## 2012-12-03 DIAGNOSIS — I739 Peripheral vascular disease, unspecified: Secondary | ICD-10-CM

## 2012-12-03 DIAGNOSIS — L98499 Non-pressure chronic ulcer of skin of other sites with unspecified severity: Secondary | ICD-10-CM

## 2012-12-03 NOTE — Progress Notes (Signed)
Patient is a 67 year old male status post amputation of toes 1 and 2 right foot on October 29. He returns today for further followup. He has had some serous drainage from the amputation site. He has no significant pain. He had some erythema around the incision recently. He was started on vancomycin on dialysis at that time. He denies any fever or chills.  Physical exam:       Filed Vitals:   12/03/12 1627  BP: 137/69  Pulse: 70  Height: 5\' 10"  (1.778 m)  Weight: 199 lb 12.8 oz (90.629 kg)  SpO2: 100%   Right first and second toe amputation sites skin approximated, maceration of the central portion of the incision erythema improved , no obvious abscess no fluctuance, some granulation tissue around the edges  Assessment: Marginal healing toe amputation site but too early to tell if this will fail, certainly no worse than his previous office visit may be slightly improved  Plan: Follow up with me in 2 weeks. If wounds continued to deteriorate we'll need to consider whether or not to do a below-knee amputation.  Fabienne Bruns, MD [facility] Office: [phone] Pager: [phone]

## 2012-12-14 ENCOUNTER — Inpatient Hospital Stay (HOSPITAL_COMMUNITY)
Admission: EM | Admit: 2012-12-14 | Discharge: 2012-12-18 | DRG: 474 | Disposition: A | Discharge status: Home Health | Payer: Medicare Other | Attending: Internal Medicine | Admitting: Internal Medicine

## 2012-12-14 DIAGNOSIS — N185 Chronic kidney disease, stage 5: Secondary | ICD-10-CM

## 2012-12-14 DIAGNOSIS — C9000 Multiple myeloma not having achieved remission: Secondary | ICD-10-CM

## 2012-12-14 DIAGNOSIS — E108 Type 1 diabetes mellitus with unspecified complications: Secondary | ICD-10-CM

## 2012-12-14 DIAGNOSIS — I120 Hypertensive chronic kidney disease with stage 5 chronic kidney disease or end stage renal disease: Secondary | ICD-10-CM | POA: Diagnosis present

## 2012-12-14 DIAGNOSIS — R001 Bradycardia, unspecified: Secondary | ICD-10-CM

## 2012-12-14 DIAGNOSIS — D631 Anemia in chronic kidney disease: Secondary | ICD-10-CM

## 2012-12-14 DIAGNOSIS — E611 Iron deficiency: Secondary | ICD-10-CM

## 2012-12-14 DIAGNOSIS — Z48812 Encounter for surgical aftercare following surgery on the circulatory system: Secondary | ICD-10-CM

## 2012-12-14 DIAGNOSIS — E039 Hypothyroidism, unspecified: Secondary | ICD-10-CM

## 2012-12-14 DIAGNOSIS — Z87891 Personal history of nicotine dependence: Secondary | ICD-10-CM

## 2012-12-14 DIAGNOSIS — I10 Essential (primary) hypertension: Secondary | ICD-10-CM

## 2012-12-14 DIAGNOSIS — E890 Postprocedural hypothyroidism: Secondary | ICD-10-CM | POA: Diagnosis present

## 2012-12-14 DIAGNOSIS — T8740 Infection of amputation stump, unspecified extremity: Secondary | ICD-10-CM | POA: Diagnosis not present

## 2012-12-14 DIAGNOSIS — E871 Hypo-osmolality and hyponatremia: Secondary | ICD-10-CM

## 2012-12-14 DIAGNOSIS — E1169 Type 2 diabetes mellitus with other specified complication: Secondary | ICD-10-CM | POA: Diagnosis present

## 2012-12-14 DIAGNOSIS — R5381 Other malaise: Secondary | ICD-10-CM | POA: Diagnosis not present

## 2012-12-14 DIAGNOSIS — N186 End stage renal disease: Secondary | ICD-10-CM

## 2012-12-14 DIAGNOSIS — M86179 Other acute osteomyelitis, unspecified ankle and foot: Secondary | ICD-10-CM | POA: Diagnosis present

## 2012-12-14 DIAGNOSIS — R531 Weakness: Secondary | ICD-10-CM

## 2012-12-14 DIAGNOSIS — N2581 Secondary hyperparathyroidism of renal origin: Secondary | ICD-10-CM | POA: Diagnosis present

## 2012-12-14 DIAGNOSIS — M869 Osteomyelitis, unspecified: Secondary | ICD-10-CM | POA: Diagnosis present

## 2012-12-14 DIAGNOSIS — Y835 Amputation of limb(s) as the cause of abnormal reaction of the patient, or of later complication, without mention of misadventure at the time of the procedure: Secondary | ICD-10-CM | POA: Diagnosis present

## 2012-12-14 DIAGNOSIS — E869 Volume depletion, unspecified: Secondary | ICD-10-CM

## 2012-12-14 DIAGNOSIS — E119 Type 2 diabetes mellitus without complications: Secondary | ICD-10-CM | POA: Diagnosis present

## 2012-12-14 DIAGNOSIS — E872 Acidosis, unspecified: Secondary | ICD-10-CM

## 2012-12-14 DIAGNOSIS — J9601 Acute respiratory failure with hypoxia: Secondary | ICD-10-CM

## 2012-12-14 DIAGNOSIS — I739 Peripheral vascular disease, unspecified: Secondary | ICD-10-CM

## 2012-12-14 DIAGNOSIS — M86171 Other acute osteomyelitis, right ankle and foot: Secondary | ICD-10-CM

## 2012-12-14 DIAGNOSIS — Z4931 Encounter for adequacy testing for hemodialysis: Secondary | ICD-10-CM

## 2012-12-14 DIAGNOSIS — S98139A Complete traumatic amputation of one unspecified lesser toe, initial encounter: Secondary | ICD-10-CM

## 2012-12-14 DIAGNOSIS — Y92009 Unspecified place in unspecified non-institutional (private) residence as the place of occurrence of the external cause: Secondary | ICD-10-CM

## 2012-12-14 DIAGNOSIS — Z7982 Long term (current) use of aspirin: Secondary | ICD-10-CM

## 2012-12-14 DIAGNOSIS — M9080 Osteopathy in diseases classified elsewhere, unspecified site: Secondary | ICD-10-CM | POA: Diagnosis present

## 2012-12-14 DIAGNOSIS — Z992 Dependence on renal dialysis: Secondary | ICD-10-CM

## 2012-12-14 DIAGNOSIS — D472 Monoclonal gammopathy: Secondary | ICD-10-CM

## 2012-12-14 DIAGNOSIS — E8770 Fluid overload, unspecified: Secondary | ICD-10-CM

## 2012-12-14 LAB — CG4 I-STAT (LACTIC ACID): Lactic Acid, Venous: 1.1 mmol/L (ref 0.5–2.2)

## 2012-12-14 MED ORDER — ACETAMINOPHEN 325 MG PO TABS
650.0000 mg | ORAL_TABLET | Freq: Once | ORAL | Status: AC
  Administered 2012-12-14: 650 mg via ORAL
  Filled 2012-12-14: qty 2

## 2012-12-14 MED ORDER — VANCOMYCIN HCL IN DEXTROSE 1-5 GM/200ML-% IV SOLN
1000.0000 mg | Freq: Once | INTRAVENOUS | Status: AC
  Administered 2012-12-15: 1000 mg via INTRAVENOUS
  Filled 2012-12-14: qty 200

## 2012-12-14 MED ORDER — PIPERACILLIN-TAZOBACTAM 3.375 G IVPB 30 MIN
3.3750 g | Freq: Once | INTRAVENOUS | Status: AC
  Administered 2012-12-14: 3.375 g via INTRAVENOUS
  Filled 2012-12-14: qty 50

## 2012-12-14 NOTE — ED Notes (Signed)
On patient right foot patient has his right great toe ambutated ambutation occur 5 weeks ago. Wound site was exam by dr. Ermelinda Das

## 2012-12-14 NOTE — ED Notes (Signed)
Dr. Ranae Palms at bedside to assess patient.

## 2012-12-14 NOTE — ED Notes (Signed)
Per EMS: pt coming from home with c/o weakness after dialysis. Family stated to medics pt was short of breath. Pt's respirations are equal and unlabored. Pt had a full dialysis treatment today. Pt is A&Ox4, respirations equal and unlabored, skin warm and dry.

## 2012-12-15 ENCOUNTER — Encounter (HOSPITAL_COMMUNITY): Payer: Self-pay | Admitting: Emergency Medicine

## 2012-12-15 ENCOUNTER — Emergency Department (HOSPITAL_COMMUNITY): Payer: Medicare Other

## 2012-12-15 DIAGNOSIS — E119 Type 2 diabetes mellitus without complications: Secondary | ICD-10-CM | POA: Diagnosis present

## 2012-12-15 DIAGNOSIS — N186 End stage renal disease: Secondary | ICD-10-CM

## 2012-12-15 DIAGNOSIS — R5381 Other malaise: Secondary | ICD-10-CM | POA: Diagnosis present

## 2012-12-15 DIAGNOSIS — M869 Osteomyelitis, unspecified: Secondary | ICD-10-CM | POA: Diagnosis present

## 2012-12-15 DIAGNOSIS — E1169 Type 2 diabetes mellitus with other specified complication: Secondary | ICD-10-CM | POA: Diagnosis present

## 2012-12-15 DIAGNOSIS — E039 Hypothyroidism, unspecified: Secondary | ICD-10-CM | POA: Diagnosis present

## 2012-12-15 DIAGNOSIS — M9080 Osteopathy in diseases classified elsewhere, unspecified site: Secondary | ICD-10-CM | POA: Diagnosis present

## 2012-12-15 DIAGNOSIS — C9000 Multiple myeloma not having achieved remission: Secondary | ICD-10-CM | POA: Diagnosis present

## 2012-12-15 DIAGNOSIS — M86179 Other acute osteomyelitis, unspecified ankle and foot: Secondary | ICD-10-CM | POA: Diagnosis present

## 2012-12-15 DIAGNOSIS — D631 Anemia in chronic kidney disease: Secondary | ICD-10-CM | POA: Diagnosis present

## 2012-12-15 DIAGNOSIS — I120 Hypertensive chronic kidney disease with stage 5 chronic kidney disease or end stage renal disease: Secondary | ICD-10-CM | POA: Diagnosis present

## 2012-12-15 DIAGNOSIS — T8740 Infection of amputation stump, unspecified extremity: Secondary | ICD-10-CM | POA: Diagnosis present

## 2012-12-15 DIAGNOSIS — I10 Essential (primary) hypertension: Secondary | ICD-10-CM

## 2012-12-15 DIAGNOSIS — Z992 Dependence on renal dialysis: Secondary | ICD-10-CM | POA: Diagnosis present

## 2012-12-15 LAB — CBC WITH DIFFERENTIAL/PLATELET
Basophils Relative: 1 % (ref 0–1)
Basophils Relative: 1 % (ref 0–1)
Eosinophils Absolute: 0.1 10*3/uL (ref 0.0–0.7)
Eosinophils Absolute: 0.1 10*3/uL (ref 0.0–0.7)
Eosinophils Relative: 1 % (ref 0–5)
HCT: 31.7 % — ABNORMAL LOW (ref 39.0–52.0)
Lymphs Abs: 1 10*3/uL (ref 0.7–4.0)
MCH: 26.9 pg (ref 26.0–34.0)
MCH: 26.5 pg (ref 26.0–34.0)
MCHC: 31.5 g/dL (ref 30.0–36.0)
MCV: 84.1 fL (ref 78.0–100.0)
Monocytes Absolute: 1.1 10*3/uL — ABNORMAL HIGH (ref 0.1–1.0)
Monocytes Relative: 16 % — ABNORMAL HIGH (ref 3–12)
Neutro Abs: 6.4 10*3/uL (ref 1.7–7.7)
Neutrophils Relative %: 76 % (ref 43–77)
Neutrophils Relative %: 65 % (ref 43–77)
Platelets: 257 10*3/uL (ref 150–400)
Platelets: 247 10*3/uL (ref 150–400)
RBC: 4.12 MIL/uL — ABNORMAL LOW (ref 4.22–5.81)
RDW: 15.4 % (ref 11.5–15.5)
WBC: 7 10*3/uL (ref 4.0–10.5)

## 2012-12-15 LAB — COMPREHENSIVE METABOLIC PANEL
ALT: 9 U/L (ref 0–53)
ALT: 7 U/L (ref 0–53)
AST: 10 U/L (ref 0–37)
Albumin: 3.3 g/dL — ABNORMAL LOW (ref 3.5–5.2)
Albumin: 2.8 g/dL — ABNORMAL LOW (ref 3.5–5.2)
Alkaline Phosphatase: 85 U/L (ref 39–117)
Alkaline Phosphatase: 69 U/L (ref 39–117)
BUN: 22 mg/dL (ref 6–23)
CO2: 30 mEq/L (ref 19–32)
CO2: 30 mEq/L (ref 19–32)
Chloride: 94 mEq/L — ABNORMAL LOW (ref 96–112)
Chloride: 94 mEq/L — ABNORMAL LOW (ref 96–112)
Creatinine, Ser: 6.47 mg/dL — ABNORMAL HIGH (ref 0.50–1.35)
GFR calc non Af Amer: 9 mL/min — ABNORMAL LOW (ref >90)
GFR calc non Af Amer: 8 mL/min — ABNORMAL LOW (ref >90)
Glucose, Bld: 248 mg/dL — ABNORMAL HIGH (ref 70–99)
Potassium: 3.7 mEq/L (ref 3.5–5.1)
Potassium: 3.7 mEq/L (ref 3.5–5.1)
Sodium: 137 mEq/L (ref 135–145)
Total Bilirubin: 0.3 mg/dL (ref 0.3–1.2)
Total Bilirubin: 0.3 mg/dL (ref 0.3–1.2)
Total Protein: 7.7 g/dL (ref 6.0–8.3)

## 2012-12-15 LAB — SEDIMENTATION RATE: Sed Rate: 80 mm/hr — ABNORMAL HIGH (ref 0–16)

## 2012-12-15 LAB — TSH: TSH: 1.523 u[IU]/mL (ref 0.350–4.500)

## 2012-12-15 MED ORDER — ONDANSETRON HCL 4 MG/2ML IJ SOLN: 4.0000 mg | Freq: Four times a day (QID) | INTRAMUSCULAR | Status: DC | PRN

## 2012-12-15 MED ORDER — ACETAMINOPHEN 650 MG RE SUPP: 650.0000 mg | Freq: Four times a day (QID) | RECTAL | Status: DC | PRN

## 2012-12-15 MED ORDER — ACETAMINOPHEN 325 MG PO TABS
650.0000 mg | ORAL_TABLET | Freq: Four times a day (QID) | ORAL | Status: DC | PRN
  Administered 2012-12-15 – 2012-12-18 (×9): 650 mg via ORAL
  Filled 2012-12-15 (×10): qty 2

## 2012-12-15 MED ORDER — GABAPENTIN 400 MG PO CAPS
400.0000 mg | ORAL_CAPSULE | Freq: Every day | ORAL | Status: DC
  Administered 2012-12-15 – 2012-12-17 (×3): 400 mg via ORAL
  Filled 2012-12-15 (×4): qty 1

## 2012-12-15 MED ORDER — OXYCODONE HCL 5 MG PO TABS
5.0000 mg | ORAL_TABLET | Freq: Four times a day (QID) | ORAL | Status: DC | PRN
  Administered 2012-12-17 – 2012-12-18 (×3): 5 mg via ORAL
  Filled 2012-12-15 (×2): qty 1

## 2012-12-15 MED ORDER — ONDANSETRON HCL 4 MG PO TABS: 4.0000 mg | ORAL_TABLET | Freq: Four times a day (QID) | ORAL | Status: DC | PRN

## 2012-12-15 MED ORDER — NEBIVOLOL HCL 5 MG PO TABS
5.0000 mg | ORAL_TABLET | Freq: Every day | ORAL | Status: DC
  Administered 2012-12-15 – 2012-12-17 (×3): 5 mg via ORAL
  Filled 2012-12-15 (×4): qty 1

## 2012-12-15 MED ORDER — LEVOTHYROXINE SODIUM 175 MCG PO TABS
175.0000 ug | ORAL_TABLET | Freq: Every day | ORAL | Status: DC
  Administered 2012-12-15 – 2012-12-18 (×4): 175 ug via ORAL
  Filled 2012-12-15 (×5): qty 1

## 2012-12-15 MED ORDER — VANCOMYCIN HCL IN DEXTROSE 1-5 GM/200ML-% IV SOLN
1000.0000 mg | INTRAVENOUS | Status: DC
  Filled 2012-12-15: qty 200

## 2012-12-15 MED ORDER — LINAGLIPTIN 5 MG PO TABS
5.0000 mg | ORAL_TABLET | Freq: Every day | ORAL | Status: DC
  Administered 2012-12-15 – 2012-12-18 (×4): 5 mg via ORAL
  Filled 2012-12-15 (×4): qty 1

## 2012-12-15 MED ORDER — PIPERACILLIN-TAZOBACTAM IN DEX 2-0.25 GM/50ML IV SOLN
2.2500 g | Freq: Three times a day (TID) | INTRAVENOUS | Status: DC
  Administered 2012-12-15 – 2012-12-18 (×9): 2.25 g via INTRAVENOUS
  Filled 2012-12-15 (×11): qty 50

## 2012-12-15 MED ORDER — RENA-VITE PO TABS
1.0000 | ORAL_TABLET | Freq: Every day | ORAL | Status: DC
  Administered 2012-12-15 – 2012-12-17 (×3): 1 via ORAL
  Filled 2012-12-15 (×4): qty 1

## 2012-12-15 MED ORDER — DOXERCALCIFEROL 4 MCG/2ML IV SOLN
2.0000 ug | INTRAVENOUS | Status: DC
  Administered 2012-12-17 – 2012-12-18 (×2): 2 ug via INTRAVENOUS
  Filled 2012-12-15 (×2): qty 2

## 2012-12-15 MED ORDER — CALCIUM ACETATE 667 MG PO CAPS
2001.0000 mg | ORAL_CAPSULE | Freq: Three times a day (TID) | ORAL | Status: DC
  Administered 2012-12-15 – 2012-12-18 (×7): 2001 mg via ORAL
  Filled 2012-12-15 (×12): qty 3

## 2012-12-15 MED ORDER — CALCIUM ACETATE 667 MG PO CAPS
667.0000 mg | ORAL_CAPSULE | Freq: Three times a day (TID) | ORAL | Status: DC
  Administered 2012-12-15: 667 mg via ORAL
  Filled 2012-12-15 (×5): qty 1

## 2012-12-15 MED ORDER — DOXAZOSIN MESYLATE 2 MG PO TABS
2.0000 mg | ORAL_TABLET | Freq: Every day | ORAL | Status: DC
  Administered 2012-12-15 – 2012-12-17 (×3): 2 mg via ORAL
  Filled 2012-12-15 (×4): qty 1

## 2012-12-15 MED ORDER — INSULIN ASPART 100 UNIT/ML ~~LOC~~ SOLN
0.0000 [IU] | Freq: Three times a day (TID) | SUBCUTANEOUS | Status: DC
  Administered 2012-12-15: 7 [IU] via SUBCUTANEOUS
  Administered 2012-12-15 – 2012-12-17 (×4): 2 [IU] via SUBCUTANEOUS
  Administered 2012-12-17 – 2012-12-18 (×2): 1 [IU] via SUBCUTANEOUS

## 2012-12-15 MED ORDER — SODIUM CHLORIDE 0.9 % IJ SOLN
3.0000 mL | Freq: Two times a day (BID) | INTRAMUSCULAR | Status: DC
  Administered 2012-12-15 – 2012-12-17 (×5): 3 mL via INTRAVENOUS

## 2012-12-15 NOTE — Progress Notes (Signed)
ANTIBIOTIC CONSULT NOTE - FOLLOW UP  Pharmacy Consult for Vancomycin + Zosyn Indication: Empiric coverage of R-foot osteo  Allergies  Allergen Reactions  . Lisinopril Cough  . Ivp Dye [Iodinated Diagnostic Agents] Hives  . Morphine And Related Other (See Comments)    Bradycardia states patient    Patient Measurements: Height: 5\' 10"  (177.8 cm) Weight: 205 lb 7.5 oz (93.2 kg) IBW/kg (Calculated) : 73  Vital Signs: Temp: 98.6 F (37 C) (12/02 0620) Temp src: Oral (12/02 0245) BP: 106/52 mmHg (12/02 0620) Pulse Rate: 65 (12/02 0620) Intake/Output from previous day:   Intake/Output from this shift: Total I/O In: 320 [P.O.:320] Out: -   Labs:  Recent Labs  12/14/12 2314 12/15/12 0524  WBC 8.4 7.0  HGB 11.1* 10.0*  PLT 257 247  CREATININE 5.68* 6.47*   Estimated Creatinine Clearance: 12.4 ml/min (by C-G formula based on Cr of 6.47).  Recent Labs  12/15/12 0732  VANCORANDOM 15.5     Microbiology: No results found for this or any previous visit (from the past 720 hour(s)).  Anti-infectives   Start     Dose/Rate Route Frequency Ordered Stop   12/15/12 0800  piperacillin-tazobactam (ZOSYN) IVPB 2.25 g     2.25 g 100 mL/hr over 30 Minutes Intravenous 3 times per day 12/15/12 0515     12/14/12 2315  vancomycin (VANCOCIN) IVPB 1000 mg/200 mL premix     1,000 mg 200 mL/hr over 60 Minutes Intravenous  Once 12/14/12 2314 12/15/12 0148   12/14/12 2315  piperacillin-tazobactam (ZOSYN) IVPB 3.375 g     3.375 g 100 mL/hr over 30 Minutes Intravenous  Once 12/14/12 2314 12/15/12 0011      Assessment: 67 y.o. M with ESRD who presented with weakness and persistent RLE foot infection despite recent 1st/2nd toe amputations on 10/28. Foot X-ray concerning for osteo and pharmacy consulted to dose Vancomycin + Zosyn. Noted plans for transmet amputation on 12/3.  It was unclear if the patient received a dose of Vancomycin prior to admission at the dialysis center, however it  is known that 1g was given in the [facility]. A Vancomycin random level this morning is 15.5 mcg/ml and reflective of therapeutic levels for now. The patient is planning for HD on Wed, 12/3 -- will schedule next Vancomycin dose to be given post-HD.  Goal of Therapy:  Pre-HD Vancomycin level of 15-25 mcg/ml  Plan:  1. Vancomycin 1g IV post HD sessions on MWF 2. Continue Zosyn 2.25g IV every 8 hours 3. Will continue to follow HD schedule/duration, culture results, LOT, and antibiotic de-escalation plans   Georgina Pillion, PharmD, BCPS Clinical Pharmacist Pager: [phone] 12/15/2012 2:06 PM

## 2012-12-15 NOTE — Consult Note (Addendum)
Pt known to me.  Had toe amp several weeks ago.  Slow to heal now admitted with fever and drainage possible osteo.  He is improved today after antibiotics last night.  He is on chronic hemodialysis MWF.  Multiple chronic medical problems diabetes hypertension ESRD asthma all currently stable  Past Medical History  Diagnosis Date  . Hypertension   . Diabetes mellitus without complication   . Shortness of breath   . Pneumonia     2012  . Heart murmur   . Glaucoma   . Multiple myeloma, without mention of having achieved remission(203.00) 03/30/2012    Cytogenetic neg on 03/23/2012.  . End stage renal disease on dialysis   . Asthma   . Hyperparathyroidism, secondary renal    Past Surgical History  Procedure Laterality Date  . Thyroidectomy    . Cervical disc surgery    . Eye surgery      CATARACTS  . Insertion of dialysis catheter Right 03/19/2012    Procedure: INSERTION OF DIALYSIS CATHETER;  Surgeon: Pryor Ochoa, MD;  Location: [facility] OR;  Service: Vascular;  Laterality: Right;  Right Internal Jugular  . Av fistula placement Left 03/25/2012    Procedure: ARTERIOVENOUS (AV) FISTULA CREATION;  Surgeon: Pryor Ochoa, MD;  Location: [facility] OR;  Service: Vascular;  Laterality: Left;  . Ligation of competing branches of arteriovenous fistula Left 05/08/2012    Procedure: LIGATION OF COMPETING BRANCHES OF ARTERIOVENOUS FISTULA;  Surgeon: Pryor Ochoa, MD;  Location: [facility] OR;  Service: Vascular;  Laterality: Left;  Ultrasound guided  . Cardiac catheterization      approx 30 years ago  . Amputation Right 11/10/2012    Procedure: AMPUTATION FIRST and SECOND TOES Right Foot;  Surgeon: Sherren Kerns, MD;  Location: [facility] OR;  Service: Vascular;  Laterality: Right;     ROS: he denies shortness of breath or chest pain  PE:  Filed Vitals:   12/15/12 0245 12/15/12 0307 12/15/12 0500 12/15/12 0620  BP:  106/50  106/52  Pulse:  62  65  Temp: 98 F (36.7 C) 98.7 F (37.1 C)  98.6 F (37 C)   TempSrc: Oral     Resp:  18  18  Height:      Weight:   205 lb 4 oz (93.1 kg) 205 lb 7.5 oz (93.2 kg)  SpO2: 100% 99%  99%    right lower extremity: macerated distal incision in foot small amount of serous drainage no erythema in foot no fluctuance non tender  Chest: Clear to auscultation Cardiac regular rate and rhythm  A: poorly healing toe amputation after 6 weeks now with admit fever/osteo P: Discussed with patient open transmet amputation versus BKA.  He has opted for open transmet with VAC.  Told him 50% chance of healing.   1.  To OR tomorrow for transmet 2.  NPO p midnight, continue antibiotics 3.  Please do dialysis as early as possible tomorrow to avoid OR schedule conflict.  Fabienne Bruns, MD [facility] Office: [phone] Pager: [phone]

## 2012-12-15 NOTE — Progress Notes (Signed)
[facility] PROGRESS NOTE  Jack [name] ZOX:[id number] DOB: 11-28-1945 DOA: 12/14/2012 PCP: Geraldo Pitter, MD  Assessment/Plan: 1. Osteomyelitis of the right foot: - admitted for IV antibiotics.  - vascular surgery consulted and going to OR in am.  - on IV vanco AND IV zosyn day 2.  - pain control .  - blood cultures drawn in ED and pending.    2. ESRD ON HD: - HD M/W/F.  - renal consulted.   3. Hypertension: -  controlled.   4. Diabetes Mellitus: -  CBG (last 3)   Recent Labs  12/15/12 0633  GLUCAP 264*    Continue with SSI.   5. Hypothyroidism: - resume synthroid.   6. MGUS: - outpatient follow up at [facility] [place]   7. Anemia; - normocytic and probably secondary to chronic renal failure.  - continue to monitor.   Code Status: Full code Family Communication: none atbedside, discussed the plan of care with the patient, . Disposition Plan: pending PT eval after surgery.    Consultants:  Vascular surgery.   Procedures:  none  Antibiotics: Vancomycin 12/1 Zosyn 12/1  HPI/Subjective: No pain, comfortable.   Objective: Filed Vitals:   12/15/12 0620  BP: 106/52  Pulse: 65  Temp: 98.6 F (37 C)  Resp: 18    Intake/Output Summary (Last 24 hours) at 12/15/12 1050 Last data filed at 12/15/12 0800  Gross per 24 hour  Intake    320 ml  Output      0 ml  Net    320 ml   Filed Weights   12/14/12 2306 12/15/12 0500 12/15/12 0620  Weight: 89.812 kg (198 lb) 93.1 kg (205 lb 4 oz) 93.2 kg (205 lb 7.5 oz)    Exam:   General:  Alert afebrile comfortable  Cardiovascular: s1s2  Respiratory: ctab  Abdomen: soft TN ND BS+  Musculoskeletal: right foot wrapped. No discharge seen.   Data Reviewed: Basic Metabolic Panel:  Recent Labs Lab 12/14/12 2314 12/15/12 0524  NA 137 136  K 3.7 3.7  CL 94* 94*  CO2 30 30  GLUCOSE 248* 304*  BUN 22 27*  CREATININE 5.68* 6.47*  CALCIUM 8.8 8.2*   Liver Function Tests:  Recent  Labs Lab 12/14/12 2314 12/15/12 0524  AST 11 10  ALT 9 7  ALKPHOS 85 69  BILITOT 0.3 0.3  PROT 7.7 6.9  ALBUMIN 3.3* 2.8*   No results found for this basename: LIPASE, AMYLASE,  in the last 168 hours No results found for this basename: AMMONIA,  in the last 168 hours CBC:  Recent Labs Lab 12/14/12 2314 12/15/12 0524  WBC 8.4 7.0  NEUTROABS 6.4 4.5  HGB 11.1* 10.0*  HCT 35.5* 31.7*  MCV 86.2 84.1  PLT 257 247   Cardiac Enzymes: No results found for this basename: CKTOTAL, CKMB, CKMBINDEX, TROPONINI,  in the last 168 hours BNP (last 3 results)  Recent Labs  03/18/12 0940  PROBNP 12648.0*   CBG:  Recent Labs Lab 12/15/12 0633  GLUCAP 264*    No results found for this or any previous visit (from the past 240 hour(s)).   Studies: Dg Chest 2 View  12/15/2012   CLINICAL DATA:  Fever.  EXAM: CHEST  2 VIEW  COMPARISON:  Chest radiograph November 10, 2012  FINDINGS: Cardiomediastinal silhouette is nonsuspicious, mildly calcified aortic knob. Persistently elevated right hemidiaphragm with minimal right lung base strandy densities. The lungs are otherwise clear without pleural effusions or focal consolidations. Pulmonary vasculature  is unremarkable. Trachea projects midline and there is no pneumothorax. Soft tissue planes and included osseous structures are nonsuspicious.  IMPRESSION: Similar elevated right hemidiaphragm with minimal right lung base atelectasis.   Electronically Signed   By: Awilda Metro   On: 12/15/2012 00:37   Dg Foot Complete Right  12/15/2012   CLINICAL DATA:  Pain and drainage at close, history of diabetes, evaluate for osteomyelitis.  EXAM: RIGHT FOOT COMPLETE - 3+ VIEW  COMPARISON:  Right foot radiograph November 14, 2012  FINDINGS: Status post 1st and 2nd digit amputation at metacarpophalangeal joint space. Erosive changes of the distal 1st metatarsus is similar, with increasing mild periosteal reaction medially. Worsening focal osteopenia within the  2nd metatarsal head best appreciated on the oblique view.  No acute fracture deformity. No dislocation. Type 2 navicular bone. Dense vascular calcifications. Irregular soft tissue at the level of the amputation with suspected ulceration, no subcutaneous emphysema nor radiopaque foreign bodies.  IMPRESSION: Status post 1st and 2nd toe amputations, with increasing periosteal reaction of the distal 1st metatarsus and increasing osteopenia of the 2nd metatarsal head concerning for osteomyelitis.   Electronically Signed   By: Awilda Metro   On: 12/15/2012 00:41    Scheduled Meds: . calcium acetate  667 mg Oral TID WC  . doxazosin  2 mg Oral QHS  . gabapentin  400 mg Oral QHS  . insulin aspart  0-9 Units Subcutaneous TID WC  . levothyroxine  175 mcg Oral QAC breakfast  . linagliptin  5 mg Oral Daily  . multivitamin  1 tablet Oral QHS  . nebivolol  5 mg Oral Daily  . piperacillin-tazobactam (ZOSYN)  IV  2.25 g Intravenous Q8H  . sodium chloride  3 mL Intravenous Q12H   Continuous Infusions:   Principal Problem:   Osteomyelitis of foot, acute Active Problems:   Hypothyroidism   End stage renal disease   Diabetes mellitus   Osteomyelitis of foot    Time spent: 15 min    Jack [name]  [facility] Pager [phone] If 7PM-7AM, please contact night-coverage at [email or web address], password [facility] 12/15/2012, 10:50 AM  LOS: 1 day

## 2012-12-15 NOTE — Consult Note (Signed)
Renal Service Consult Note [facility]  Jack [name] 12/15/2012 Jack [name] D Requesting Physician:  Dr Jack [name]  Reason for Consult:  ESRD pt with DM and R foot osteo HPI: The patient is a 67 y.o. year-old with esrd due to DM vs myeloma, HTN had R 1st and 2nd toe amp surgery on Oct 28th for osteomyelitis.  Now has refractory infection and is admitted for IV abx and further surgery. He has no complaints.  Tolerates dialysis well.    ROS  no ha, blurry vision  no sob or cp  no abd pain, n/v/d  no jt pain   no skin rash  Past Medical History  Past Medical History  Diagnosis Date  . Hypertension   . Diabetes mellitus without complication   . Shortness of breath   . Pneumonia     2012  . Heart murmur   . Glaucoma   . Multiple myeloma, without mention of having achieved remission(203.00) 03/30/2012    Cytogenetic neg on 03/23/2012.  . End stage renal disease on dialysis   . Asthma   . Hyperparathyroidism, secondary renal    Past Surgical History  Past Surgical History  Procedure Laterality Date  . Thyroidectomy    . Cervical disc surgery    . Eye surgery      CATARACTS  . Insertion of dialysis catheter Right 03/19/2012    Procedure: INSERTION OF DIALYSIS CATHETER;  Surgeon: Pryor Ochoa, MD;  Location: [facility] OR;  Service: Vascular;  Laterality: Right;  Right Internal Jugular  . Av fistula placement Left 03/25/2012    Procedure: ARTERIOVENOUS (AV) FISTULA CREATION;  Surgeon: Pryor Ochoa, MD;  Location: [facility] OR;  Service: Vascular;  Laterality: Left;  . Ligation of competing branches of arteriovenous fistula Left 05/08/2012    Procedure: LIGATION OF COMPETING BRANCHES OF ARTERIOVENOUS FISTULA;  Surgeon: Pryor Ochoa, MD;  Location: [facility] OR;  Service: Vascular;  Laterality: Left;  Ultrasound guided  . Cardiac catheterization      approx 30 years ago  . Amputation Right 11/10/2012    Procedure: AMPUTATION FIRST and SECOND TOES Right Foot;  Surgeon: Sherren Kerns, MD;  Location: [facility] OR;  Service: Vascular;  Laterality: Right;   Family History  Family History  Problem Relation Age of Onset  . Diabetes Mother   . Cancer Mother     bone   . Kidney disease Father    Social History  reports that he quit smoking about 30 years ago. His smoking use included Cigarettes. He smoked 0.00 packs per day. He has never used smokeless tobacco. He reports that he does not drink alcohol or use illicit drugs. Allergies  Allergies  Allergen Reactions  . Lisinopril Cough  . Ivp Dye [Iodinated Diagnostic Agents] Hives  . Morphine And Related Other (See Comments)    Bradycardia states patient   Home medications Prior to Admission medications   Medication Sig Start Date End Date Taking? Authorizing Provider  aspirin EC 81 MG tablet Take 81 mg by mouth daily.   Yes Historical Provider, MD  b complex-vitamin c-folic acid (NEPHRO-VITE) 0.8 MG TABS tablet Take 1 tablet by mouth daily.   Yes Historical Provider, MD  BYSTOLIC 5 MG tablet Take 5 mg by mouth daily. 09/09/12  Yes Historical Provider, MD  calcium acetate (PHOSLO) 667 MG capsule Take 2,001 mg by mouth 3 (three) times daily with meals.   Yes Historical Provider, MD  doxazosin (CARDURA) 2 MG tablet Take 2 mg  by mouth at bedtime.   Yes Historical Provider, MD  gabapentin (NEURONTIN) 400 MG capsule Take 400 mg by mouth at bedtime.   Yes Historical Provider, MD  levothyroxine (SYNTHROID, LEVOTHROID) 175 MCG tablet Take 175 mcg by mouth every morning.    Yes Historical Provider, MD  oxyCODONE (OXY IR/ROXICODONE) 5 MG immediate release tablet Take 1 tablet (5 mg total) by mouth every 6 (six) hours as needed for pain. 11/11/12  Yes Lars Mage, PA-C  saxagliptin HCl (ONGLYZA) 5 MG TABS tablet Take 5 mg by mouth at bedtime.    Yes Historical Provider, MD   Liver Function Tests  Recent Labs Lab 12/14/12 2314 12/15/12 0524  AST 11 10  ALT 9 7  ALKPHOS 85 69  BILITOT 0.3 0.3  PROT 7.7 6.9  ALBUMIN 3.3*  2.8*   No results found for this basename: LIPASE, AMYLASE,  in the last 168 hours CBC  Recent Labs Lab 12/14/12 2314 12/15/12 0524  WBC 8.4 7.0  NEUTROABS 6.4 4.5  HGB 11.1* 10.0*  HCT 35.5* 31.7*  MCV 86.2 84.1  PLT 257 247   Basic Metabolic Panel  Recent Labs Lab 12/14/12 2314 12/15/12 0524  NA 137 136  K 3.7 3.7  CL 94* 94*  CO2 30 30  GLUCOSE 248* 304*  BUN 22 27*  CREATININE 5.68* 6.47*  CALCIUM 8.8 8.2*    Exam  Blood pressure 106/52, pulse 65, temperature 98.6 F (37 C), temperature source Oral, resp. rate 18, height 5\' 10"  (1.778 m), weight 93.2 kg (205 lb 7.5 oz), SpO2 99.00%.  gen: alert, WD, WN, no distress  skin: no rash, cyanosis  heent: eomi, sclera anicteric, throat clear  neck: + jvd, no LAN  chest: clear bilat, no rales or wheezing  cor: regular, no M or rub  abd: soft, nontender, nd, no ascites  ext: no leg edema, R foot wrapped, no joint effusion  neuro: alert, ox3, nonfocal  access: LUA AVF patent  Dialysis orders: [facility]WF [facility] 4hrs  F180   400/800   2/2.25 [place]   88.5kg   Heparin 8700   LUA AVF   Hectorol 2ug     Epo 4200     Venofer none Last labs: tsat 18% ferritin 541  phos 6  pth 422  Assessment: 1. Osteomyelitis R foot / recent R 1st & 2nd toe amp 10/28- for IV abx and foot surgery tomorrow 2. ESRD HD tomorrow 3. Anemia of ckd- Hb 10.0, hold esa for now 4. MBD (metabolic bone disease)- cont vit D 5. HTN/volume- up 5-6kg by wts, no vol excess on exam, UF as tol tomorrow with dialysis. Cont bp meds 6. DM 2- per primary 7. Hx myeloma vs MGUS   Vinson Moselle MD  pager [phone]    cell [phone]  12/15/2012, 12:39 PM

## 2012-12-15 NOTE — ED Provider Notes (Signed)
CSN: [id number]     Arrival date & time 12/14/12  2248 History   First MD Initiated Contact with Patient 12/14/12 2302     Chief Complaint  Patient presents with  . Fatigue   (Consider location/radiation/quality/duration/timing/severity/associated sxs/prior Treatment) HPI Patient is a 67 year old male with history of end-stage renal disease on hemodialysis. States he went to dialysis today and seemed generally weak afterwards. He denies any shortness of breath or cough. He denies any chest pain. He denies any abdominal pain, nausea or vomiting. The end of October the patient had his right great toe and second toe amputated. He is following up with Dr. Darrick Penna and had been on vancomycin IV. He denies any new drainage from the amputation site or pain. Past Medical History  Diagnosis Date  . Hypertension   . Diabetes mellitus without complication   . Shortness of breath   . Pneumonia     2012  . Heart murmur   . Glaucoma   . Multiple myeloma, without mention of having achieved remission(203.00) 03/30/2012    Cytogenetic neg on 03/23/2012.  . End stage renal disease on dialysis   . Asthma   . Hyperparathyroidism, secondary renal    Past Surgical History  Procedure Laterality Date  . Thyroidectomy    . Cervical disc surgery    . Eye surgery      CATARACTS  . Insertion of dialysis catheter Right 03/19/2012    Procedure: INSERTION OF DIALYSIS CATHETER;  Surgeon: Pryor Ochoa, MD;  Location: [facility] OR;  Service: Vascular;  Laterality: Right;  Right Internal Jugular  . Av fistula placement Left 03/25/2012    Procedure: ARTERIOVENOUS (AV) FISTULA CREATION;  Surgeon: Pryor Ochoa, MD;  Location: [facility] OR;  Service: Vascular;  Laterality: Left;  . Ligation of competing branches of arteriovenous fistula Left 05/08/2012    Procedure: LIGATION OF COMPETING BRANCHES OF ARTERIOVENOUS FISTULA;  Surgeon: Pryor Ochoa, MD;  Location: [facility] OR;  Service: Vascular;  Laterality: Left;  Ultrasound guided  .  Cardiac catheterization      approx 30 years ago  . Amputation Right 11/10/2012    Procedure: AMPUTATION FIRST and SECOND TOES Right Foot;  Surgeon: Sherren Kerns, MD;  Location: [facility] OR;  Service: Vascular;  Laterality: Right;   Family History  Problem Relation Age of Onset  . Diabetes Mother   . Cancer Mother     bone   . Kidney disease Father    History  Substance Use Topics  . Smoking status: Former Smoker    Types: Cigarettes    Quit date: 03/19/1982  . Smokeless tobacco: Never Used  . Alcohol Use: No     Comment: formerly    Review of Systems  Constitutional: Positive for fever and fatigue.  Respiratory: Negative for cough and shortness of breath.   Cardiovascular: Negative for chest pain.  Gastrointestinal: Negative for nausea, vomiting, abdominal pain and diarrhea.  Musculoskeletal: Negative for back pain, neck pain and neck stiffness.  Skin: Positive for rash and wound.  Neurological: Positive for weakness (generalized). Negative for dizziness, light-headedness, numbness and headaches.  All other systems reviewed and are negative.    Allergies  Lisinopril; Ivp dye; and Morphine and related  Home Medications   Current Outpatient Rx  Name  Route  Sig  Dispense  Refill  . aspirin EC 81 MG tablet   Oral   Take 81 mg by mouth daily.         Marland Kitchen b complex-vitamin c-folic  acid (NEPHRO-VITE) 0.8 MG TABS tablet   Oral   Take 1 tablet by mouth daily.         Marland Kitchen BYSTOLIC 5 MG tablet   Oral   Take 5 mg by mouth daily.         . calcium acetate (PHOSLO) 667 MG capsule   Oral   Take 667 mg by mouth 3 (three) times daily with meals.          . cephALEXin (KEFLEX) 500 MG capsule   Oral   Take 500 mg by mouth 2 (two) times daily.         Marland Kitchen doxazosin (CARDURA) 2 MG tablet   Oral   Take 2 mg by mouth at bedtime.         . gabapentin (NEURONTIN) 100 MG capsule   Oral   Take 100-200 mg by mouth 2 (two) times daily. Take 1 capsule in the morning and 2  caps at night         . levothyroxine (SYNTHROID, LEVOTHROID) 175 MCG tablet   Oral   Take 175 mcg by mouth every morning.          Marland Kitchen oxyCODONE (OXY IR/ROXICODONE) 5 MG immediate release tablet   Oral   Take 1 tablet (5 mg total) by mouth every 6 (six) hours as needed for pain.   30 tablet   0   . saxagliptin HCl (ONGLYZA) 5 MG TABS tablet   Oral   Take 5 mg by mouth at bedtime.           BP 136/65  Pulse 69  Temp(Src) 101.7 F (38.7 C) (Oral)  Ht 5\' 10"  (1.778 m)  Wt 198 lb (89.812 kg)  BMI 28.41 kg/m2  SpO2 97% Physical Exam  Nursing note and vitals reviewed. Constitutional: He is oriented to person, place, and time. He appears well-developed and well-nourished. No distress.  HENT:  Head: Normocephalic and atraumatic.  Mouth/Throat: Oropharynx is clear and moist.  Eyes: EOM are normal. Pupils are equal, round, and reactive to light.  Neck: Normal range of motion. Neck supple.  No meningismus  Cardiovascular: Normal rate and regular rhythm.   Murmur heard. Pulmonary/Chest: Effort normal. No respiratory distress. He has no wheezes. He has rales (bilateral bases). He exhibits no tenderness.  Abdominal: Soft. Bowel sounds are normal. He exhibits no distension and no mass. There is no tenderness. There is no rebound and no guarding.  Musculoskeletal: Normal range of motion. He exhibits no edema and no tenderness.  Left upper extremity shunt in place with palpable thrill. Right foot with first and second toes amputated. Patient has necrotic tissue at the amputation site with a minimal amount of purulent discharge. The foot is generally much warmer than the left with mild swelling.  Neurological: He is alert and oriented to person, place, and time.  Moves all extremities without deficit. Sensation grossly intact.  Skin: Skin is warm and dry. No rash noted. No erythema.  Psychiatric: He has a normal mood and affect. His behavior is normal.    ED Course  Procedures  (including critical care time) Labs Review Labs Reviewed  CBC WITH DIFFERENTIAL - Abnormal; Notable for the following:    RBC 4.12 (*)    Hemoglobin 11.1 (*)    HCT 35.5 (*)    All other components within normal limits  CULTURE, BLOOD (ROUTINE X 2)  CULTURE, BLOOD (ROUTINE X 2)  COMPREHENSIVE METABOLIC PANEL  CG4 I-STAT (LACTIC ACID)  Imaging Review No results found.  EKG Interpretation   None       MDM  Likely postoperative right foot infection. We'll screen for other possible causes of infection.  Discussed with [facility]. Will admit to MedSurg floor. Patient's vital signs remained stable in the emergency department.  Loren Racer, MD 12/15/12 [phone]

## 2012-12-15 NOTE — ED Notes (Signed)
Medication finished infusing.

## 2012-12-15 NOTE — ED Notes (Signed)
Patient requested and received a happy meal and a sprite.

## 2012-12-15 NOTE — Progress Notes (Signed)
ANTIBIOTIC CONSULT NOTE - INITIAL  Pharmacy Consult for Vancomycin and Zosyn  Indication: osteomyelitis  Allergies  Allergen Reactions  . Lisinopril Cough  . Ivp Dye [Iodinated Diagnostic Agents] Hives  . Morphine And Related Other (See Comments)    Bradycardia states patient    Patient Measurements: Height: 5\' 10"  (177.8 cm) Weight: 198 lb (89.812 kg) IBW/kg (Calculated) : 73  Vital Signs: Temp: 98.7 F (37.1 C) (12/02 0307) Temp src: Oral (12/02 0245) BP: 106/50 mmHg (12/02 0307) Pulse Rate: 62 (12/02 0307) Intake/Output from previous day:   Intake/Output from this shift:    Labs:  Recent Labs  12/14/12 2314  WBC 8.4  HGB 11.1*  PLT 257  CREATININE 5.68*   Estimated Creatinine Clearance: 13.8 ml/min (by C-G formula based on Cr of 5.68). No results found for this basename: VANCOTROUGH, VANCOPEAK, VANCORANDOM, GENTTROUGH, GENTPEAK, GENTRANDOM, TOBRATROUGH, TOBRAPEAK, TOBRARND, AMIKACINPEAK, AMIKACINTROU, AMIKACIN,  in the last 72 hours   Microbiology: No results found for this or any previous visit (from the past 720 hour(s)).  Medical History: Past Medical History  Diagnosis Date  . Hypertension   . Diabetes mellitus without complication   . Shortness of breath   . Pneumonia     2012  . Heart murmur   . Glaucoma   . Multiple myeloma, without mention of having achieved remission(203.00) 03/30/2012    Cytogenetic neg on 03/23/2012.  . End stage renal disease on dialysis   . Asthma   . Hyperparathyroidism, secondary renal     Medications:  Prescriptions prior to admission  Medication Sig Dispense Refill  . aspirin EC 81 MG tablet Take 81 mg by mouth daily.      Marland Kitchen b complex-vitamin c-folic acid (NEPHRO-VITE) 0.8 MG TABS tablet Take 1 tablet by mouth daily.      Marland Kitchen BYSTOLIC 5 MG tablet Take 5 mg by mouth daily.      . calcium acetate (PHOSLO) 667 MG capsule Take 667 mg by mouth 3 (three) times daily with meals.       . doxazosin (CARDURA) 2 MG tablet Take  2 mg by mouth at bedtime.      . gabapentin (NEURONTIN) 400 MG capsule Take 400 mg by mouth at bedtime.      Marland Kitchen levothyroxine (SYNTHROID, LEVOTHROID) 175 MCG tablet Take 175 mcg by mouth every morning.       Marland Kitchen oxyCODONE (OXY IR/ROXICODONE) 5 MG immediate release tablet Take 1 tablet (5 mg total) by mouth every 6 (six) hours as needed for pain.  30 tablet  0  . saxagliptin HCl (ONGLYZA) 5 MG TABS tablet Take 5 mg by mouth at bedtime.        Assessment: 67 yo male with possible RLE osteomyelitis for empiric antibiotics.  Vancomycin 1 g IV given in ED at  0100  Goal of Therapy:  Vancomycin pre-HD level 15-25  Plan:  Vancomycin 500 mg IV now, then 750 mg IV qHD Zosyn 2.25 g IV q8h  Eddie Candle 12/15/2012,5:05 AM

## 2012-12-15 NOTE — H&P (Addendum)
[facility] History and Physical  Jack [name] ZOX:[id number] DOB: 04-30-1945 DOA: 12/14/2012  Referring physician: ER physician. PCP: Geraldo Pitter, MD  Specialists: Nephrologist for dialysis.  Chief Complaint: Weakness.  HPI: Jack [name] is a 67 y.o. male with history of ESRD on hemodialysis who had right foot first and second toe amputations last month presents to the ER with complaints of weakness. Patient denies any chest pain shortness of breath nausea vomiting abdominal pain or diarrhea. In the ER patient was found to be febrile with temperatures around 103F. Exam patient's right foot has some active discharge and x-ray showed features concerning for osteomyelitis of the right foot. Patient has been started on empiric antibiotics after blood cultures and has been admitted for further management.   Review of Systems: As presented in the history of presenting illness, rest negative.  Past Medical History  Diagnosis Date  . Hypertension   . Diabetes mellitus without complication   . Shortness of breath   . Pneumonia     2012  . Heart murmur   . Glaucoma   . Multiple myeloma, without mention of having achieved remission(203.00) 03/30/2012    Cytogenetic neg on 03/23/2012.  . End stage renal disease on dialysis   . Asthma   . Hyperparathyroidism, secondary renal    Past Surgical History  Procedure Laterality Date  . Thyroidectomy    . Cervical disc surgery    . Eye surgery      CATARACTS  . Insertion of dialysis catheter Right 03/19/2012    Procedure: INSERTION OF DIALYSIS CATHETER;  Surgeon: Pryor Ochoa, MD;  Location: [facility] OR;  Service: Vascular;  Laterality: Right;  Right Internal Jugular  . Av fistula placement Left 03/25/2012    Procedure: ARTERIOVENOUS (AV) FISTULA CREATION;  Surgeon: Pryor Ochoa, MD;  Location: [facility] OR;  Service: Vascular;  Laterality: Left;  . Ligation of competing branches of arteriovenous fistula Left 05/08/2012    Procedure:  LIGATION OF COMPETING BRANCHES OF ARTERIOVENOUS FISTULA;  Surgeon: Pryor Ochoa, MD;  Location: [facility] OR;  Service: Vascular;  Laterality: Left;  Ultrasound guided  . Cardiac catheterization      approx 30 years ago  . Amputation Right 11/10/2012    Procedure: AMPUTATION FIRST and SECOND TOES Right Foot;  Surgeon: Sherren Kerns, MD;  Location: [facility] OR;  Service: Vascular;  Laterality: Right;   Social History:  reports that he quit smoking about 30 years ago. His smoking use included Cigarettes. He smoked 0.00 packs per day. He has never used smokeless tobacco. He reports that he does not drink alcohol or use illicit drugs. Where does patient live home. Can patient participate in ADLs? Yes.  Allergies  Allergen Reactions  . Lisinopril Cough  . Ivp Dye [Iodinated Diagnostic Agents] Hives  . Morphine And Related Other (See Comments)    Bradycardia states patient    Family History:  Family History  Problem Relation Age of Onset  . Diabetes Mother   . Cancer Mother     bone   . Kidney disease Father       Prior to Admission medications   Medication Sig Start Date End Date Taking? Authorizing Provider  aspirin EC 81 MG tablet Take 81 mg by mouth daily.   Yes Historical Provider, MD  b complex-vitamin c-folic acid (NEPHRO-VITE) 0.8 MG TABS tablet Take 1 tablet by mouth daily.   Yes Historical Provider, MD  BYSTOLIC 5 MG tablet Take 5 mg by mouth daily. 09/09/12  Yes Historical Provider, MD  calcium acetate (PHOSLO) 667 MG capsule Take 667 mg by mouth 3 (three) times daily with meals.  03/24/12  Yes Marinda Elk, MD  doxazosin (CARDURA) 2 MG tablet Take 2 mg by mouth at bedtime.   Yes Historical Provider, MD  gabapentin (NEURONTIN) 400 MG capsule Take 400 mg by mouth at bedtime.   Yes Historical Provider, MD  levothyroxine (SYNTHROID, LEVOTHROID) 175 MCG tablet Take 175 mcg by mouth every morning.    Yes Historical Provider, MD  oxyCODONE (OXY IR/ROXICODONE) 5 MG immediate release  tablet Take 1 tablet (5 mg total) by mouth every 6 (six) hours as needed for pain. 11/11/12  Yes Lars Mage, PA-C  saxagliptin HCl (ONGLYZA) 5 MG TABS tablet Take 5 mg by mouth at bedtime.    Yes Historical Provider, MD    Physical Exam: Filed Vitals:   12/15/12 0200 12/15/12 0230 12/15/12 0245 12/15/12 0307  BP: 118/45 104/52  106/50  Pulse: 61 60  62  Temp:   98 F (36.7 C) 98.7 F (37.1 C)  TempSrc:   Oral   Resp: 18 21  18   Height:      Weight:      SpO2: 97% 96% 100% 99%     General:  Well-developed well-nourished.  Eyes: Anicteric no pallor.  ENT: No discharge from ears eyes nose mouth.  Neck: No mass felt.  Cardiovascular: S1-S2 heard.  Respiratory: No rhonchi or crepitations.  Abdomen: Soft nontender bowel sounds present.  Skin: Skin on the right foot looks dark and gangrenous. There is active discharge serous sanguinous fluid.  Musculoskeletal: Right foot as explained in the skin section.  Psychiatric: Appears normal.  Neurologic: Alert oriented to time place and person. Moves all extremities.  Labs on Admission:  Basic Metabolic Panel:  Recent Labs Lab 12/14/12 2314  NA 137  K 3.7  CL 94*  CO2 30  GLUCOSE 248*  BUN 22  CREATININE 5.68*  CALCIUM 8.8   Liver Function Tests:  Recent Labs Lab 12/14/12 2314  AST 11  ALT 9  ALKPHOS 85  BILITOT 0.3  PROT 7.7  ALBUMIN 3.3*   No results found for this basename: LIPASE, AMYLASE,  in the last 168 hours No results found for this basename: AMMONIA,  in the last 168 hours CBC:  Recent Labs Lab 12/14/12 2314  WBC 8.4  NEUTROABS 6.4  HGB 11.1*  HCT 35.5*  MCV 86.2  PLT 257   Cardiac Enzymes: No results found for this basename: CKTOTAL, CKMB, CKMBINDEX, TROPONINI,  in the last 168 hours  BNP (last 3 results)  Recent Labs  03/18/12 0940  PROBNP 12648.0*   CBG: No results found for this basename: GLUCAP,  in the last 168 hours  Radiological Exams on Admission: Dg Chest 2  View  12/15/2012   CLINICAL DATA:  Fever.  EXAM: CHEST  2 VIEW  COMPARISON:  Chest radiograph November 10, 2012  FINDINGS: Cardiomediastinal silhouette is nonsuspicious, mildly calcified aortic knob. Persistently elevated right hemidiaphragm with minimal right lung base strandy densities. The lungs are otherwise clear without pleural effusions or focal consolidations. Pulmonary vasculature is unremarkable. Trachea projects midline and there is no pneumothorax. Soft tissue planes and included osseous structures are nonsuspicious.  IMPRESSION: Similar elevated right hemidiaphragm with minimal right lung base atelectasis.   Electronically Signed   By: Awilda Metro   On: 12/15/2012 00:37   Dg Foot Complete Right  12/15/2012   CLINICAL DATA:  Pain  and drainage at close, history of diabetes, evaluate for osteomyelitis.  EXAM: RIGHT FOOT COMPLETE - 3+ VIEW  COMPARISON:  Right foot radiograph November 14, 2012  FINDINGS: Status post 1st and 2nd digit amputation at metacarpophalangeal joint space. Erosive changes of the distal 1st metatarsus is similar, with increasing mild periosteal reaction medially. Worsening focal osteopenia within the 2nd metatarsal head best appreciated on the oblique view.  No acute fracture deformity. No dislocation. Type 2 navicular bone. Dense vascular calcifications. Irregular soft tissue at the level of the amputation with suspected ulceration, no subcutaneous emphysema nor radiopaque foreign bodies.  IMPRESSION: Status post 1st and 2nd toe amputations, with increasing periosteal reaction of the distal 1st metatarsus and increasing osteopenia of the 2nd metatarsal head concerning for osteomyelitis.   Electronically Signed   By: Awilda Metro   On: 12/15/2012 00:41     Assessment/Plan Principal Problem:   Osteomyelitis of foot, acute Active Problems:   Hypothyroidism   End stage renal disease   Diabetes mellitus   Osteomyelitis of foot   1. Fever probably from right foot  osteomyelitis - blood cultures and sedimentation rate have been ordered. Continue with empiric antibiotics.. Check MRI right foot. I discussed with Dr. Darrick Penna who will be seeing patient in consult. Suspect fever is from osteomyelitis but we'll need to also check blood cultures for the source is patient dialysis fistula. Check UA. 2. Diabetes mellitus type 2 - closely follow CBGs with sliding-scale coverage. 3. End stage renal disease on dialysis - on Monday Wednesday and Friday - consult nephrology for dialysis. 4. Hypertension - continue present medications. 5. Hypothyroidism - continue Synthroid. 6. Anemia - follow CBC. 7. MGUS - follows up with [facility].    Code Status: Full code.  Family Communication: Family at the bedside.  Disposition Plan: Admit to inpatient.    Jaquann Guarisco N. [facility] Pager [phone].  If 7PM-7AM, please contact night-coverage [email or web address] Password [facility] 12/15/2012, 4:56 AM

## 2012-12-16 ENCOUNTER — Encounter (HOSPITAL_COMMUNITY)
Admission: EM | Disposition: A | Discharge status: Home Health | Payer: Self-pay | Source: Home / Self Care | Attending: Internal Medicine

## 2012-12-16 ENCOUNTER — Inpatient Hospital Stay (HOSPITAL_COMMUNITY): Payer: Medicare Other | Admitting: Anesthesiology

## 2012-12-16 ENCOUNTER — Encounter: Payer: Self-pay | Admitting: Vascular Surgery

## 2012-12-16 ENCOUNTER — Encounter (HOSPITAL_COMMUNITY): Payer: Medicare Other | Admitting: Anesthesiology

## 2012-12-16 DIAGNOSIS — D631 Anemia in chronic kidney disease: Secondary | ICD-10-CM

## 2012-12-16 DIAGNOSIS — I70269 Atherosclerosis of native arteries of extremities with gangrene, unspecified extremity: Secondary | ICD-10-CM

## 2012-12-16 DIAGNOSIS — D472 Monoclonal gammopathy: Secondary | ICD-10-CM

## 2012-12-16 DIAGNOSIS — N189 Chronic kidney disease, unspecified: Secondary | ICD-10-CM

## 2012-12-16 DIAGNOSIS — E039 Hypothyroidism, unspecified: Secondary | ICD-10-CM

## 2012-12-16 HISTORY — PX: TRANSMETATARSAL AMPUTATION: SHX6197

## 2012-12-16 LAB — GLUCOSE, CAPILLARY
Glucose-Capillary: 176 mg/dL — ABNORMAL HIGH (ref 70–99)
Glucose-Capillary: 102 mg/dL — ABNORMAL HIGH (ref 70–99)

## 2012-12-16 LAB — POCT I-STAT 4, (NA,K, GLUC, HGB,HCT): Glucose, Bld: 103 mg/dL — ABNORMAL HIGH (ref 70–99)

## 2012-12-16 LAB — SURGICAL PCR SCREEN
MRSA, PCR: NEGATIVE
Staphylococcus aureus: NEGATIVE

## 2012-12-16 SURGERY — AMPUTATION, FOOT, TRANSMETATARSAL
Anesthesia: General | Site: Foot | Laterality: Left

## 2012-12-16 MED ORDER — VANCOMYCIN HCL IN DEXTROSE 1-5 GM/200ML-% IV SOLN
1000.0000 mg | INTRAVENOUS | Status: AC
  Administered 2012-12-17: 1000 mg via INTRAVENOUS
  Filled 2012-12-16: qty 200

## 2012-12-16 MED ORDER — EPHEDRINE SULFATE 50 MG/ML IJ SOLN
INTRAMUSCULAR | Status: DC | PRN
  Administered 2012-12-16 (×4): 10 mg via INTRAVENOUS

## 2012-12-16 MED ORDER — SODIUM CHLORIDE 0.9 % IV SOLN
Freq: Once | INTRAVENOUS | Status: AC
  Administered 2012-12-16 (×2): via INTRAVENOUS

## 2012-12-16 MED ORDER — PHENYLEPHRINE HCL 10 MG/ML IJ SOLN
INTRAMUSCULAR | Status: DC | PRN
  Administered 2012-12-16 (×3): 120 ug via INTRAVENOUS

## 2012-12-16 MED ORDER — HYDROMORPHONE HCL PF 1 MG/ML IJ SOLN
0.5000 mg | INTRAMUSCULAR | Status: DC | PRN
  Administered 2012-12-16 – 2012-12-17 (×3): 0.5 mg via INTRAVENOUS
  Filled 2012-12-16 (×3): qty 1

## 2012-12-16 MED ORDER — 0.9 % SODIUM CHLORIDE (POUR BTL) OPTIME
TOPICAL | Status: DC | PRN
  Administered 2012-12-16: 1000 mL

## 2012-12-16 MED ORDER — ONDANSETRON HCL 4 MG/2ML IJ SOLN
INTRAMUSCULAR | Status: DC | PRN
  Administered 2012-12-16: 4 mg via INTRAVENOUS

## 2012-12-16 MED ORDER — VANCOMYCIN HCL IN DEXTROSE 1-5 GM/200ML-% IV SOLN
1000.0000 mg | INTRAVENOUS | Status: DC
  Filled 2012-12-16 (×2): qty 200

## 2012-12-16 MED ORDER — LIDOCAINE HCL (CARDIAC) 20 MG/ML IV SOLN
INTRAVENOUS | Status: DC | PRN
  Administered 2012-12-16: 80 mg via INTRAVENOUS

## 2012-12-16 MED ORDER — FENTANYL CITRATE 0.05 MG/ML IJ SOLN
INTRAMUSCULAR | Status: DC | PRN
  Administered 2012-12-16: 100 ug via INTRAVENOUS

## 2012-12-16 SURGICAL SUPPLY — 32 items
BANDAGE ELASTIC 4 VELCRO ST LF (GAUZE/BANDAGES/DRESSINGS) IMPLANT
BANDAGE GAUZE ELAST BULKY 4 IN (GAUZE/BANDAGES/DRESSINGS) IMPLANT
BLADE LONG MED 31X9 (MISCELLANEOUS) ×2 IMPLANT
CANISTER SUCTION 2500CC (MISCELLANEOUS) ×2 IMPLANT
CANISTER WOUND CARE 500ML ATS (WOUND CARE) ×2 IMPLANT
COVER SURGICAL LIGHT HANDLE (MISCELLANEOUS) ×2 IMPLANT
DRAPE EXTREMITY T 121X128X90 (DRAPE) ×2 IMPLANT
DRSG EMULSION OIL 3X3 NADH (GAUZE/BANDAGES/DRESSINGS) IMPLANT
DRSG VAC ATS SM SENSATRAC (GAUZE/BANDAGES/DRESSINGS) ×2 IMPLANT
ELECT REM PT RETURN 9FT ADLT (ELECTROSURGICAL) ×2
ELECTRODE REM PT RTRN 9FT ADLT (ELECTROSURGICAL) ×1 IMPLANT
GLOVE BIO SURGEON STRL SZ7.5 (GLOVE) ×2 IMPLANT
GOWN PREVENTION PLUS XLARGE (GOWN DISPOSABLE) ×2 IMPLANT
GOWN STRL NON-REIN LRG LVL3 (GOWN DISPOSABLE) ×2 IMPLANT
KIT BASIN OR (CUSTOM PROCEDURE TRAY) ×2 IMPLANT
KIT ROOM TURNOVER OR (KITS) ×2 IMPLANT
NS IRRIG 1000ML POUR BTL (IV SOLUTION) ×2 IMPLANT
PACK GENERAL/GYN (CUSTOM PROCEDURE TRAY) ×2 IMPLANT
PAD ARMBOARD 7.5X6 YLW CONV (MISCELLANEOUS) ×4 IMPLANT
SPONGE GAUZE 4X4 12PLY (GAUZE/BANDAGES/DRESSINGS) ×2 IMPLANT
SUT ETHILON 3 0 PS 1 (SUTURE) IMPLANT
SUT VIC AB 2-0 CT1 27 (SUTURE) ×3
SUT VIC AB 2-0 CT1 TAPERPNT 27 (SUTURE) ×3 IMPLANT
SUT VIC AB 3-0 SH 18 (SUTURE) IMPLANT
SUT VIC AB 3-0 SH 27 (SUTURE)
SUT VIC AB 3-0 SH 27X BRD (SUTURE) IMPLANT
SWAB COLLECTION DEVICE MRSA (MISCELLANEOUS) IMPLANT
TOWEL OR 17X24 6PK STRL BLUE (TOWEL DISPOSABLE) ×2 IMPLANT
TOWEL OR 17X26 10 PK STRL BLUE (TOWEL DISPOSABLE) ×2 IMPLANT
TUBE ANAEROBIC SPECIMEN COL (MISCELLANEOUS) IMPLANT
UNDERPAD 30X30 INCONTINENT (UNDERPADS AND DIAPERS) ×2 IMPLANT
WATER STERILE IRR 1000ML POUR (IV SOLUTION) ×2 IMPLANT

## 2012-12-16 NOTE — Op Note (Signed)
Procedure: Right foot transmetatarsal amputation  Preoperative diagnosis: Gangrene right foot  Postoperative diagnosis: Same  Anesthesia: Gen.  Specimens: Right forefoot  Operative details: After obtaining informed consent, the patient was taken to the operating room. The patient was placed in supine position on the operating room table. After induction of general anesthesia and placement of a laryngeal mask, the patient's entire right lower extremity was prepped and draped in the usual sterile fashion. Next a transverse incision was made on the right forefoot at the midshaft level of the metatarsals. The incision was then extended longitudinally to create a posterior flap at the level of the phalangeal metatarsal joint. The vessels were calcified but had pulsatile flow.  Periosteal elevator was used to raise the periosteum on all 5 metatarsal shafts. These were then divided with a saw. The remainder of the forefoot was removed sharply. The forefoot specimen was passed off to be sent to pathology. Hemostasis was obtained with cautery. The wound was thoroughly irrigated with normal saline solution. The metatarsal head edges were debrided back with rongeurs and made smooth with a rasp. The fascial edges were reapproximated using interrupted 2-0 Vicryl sutures. The skin was left open and a VAC dressing applied. There was good bleeding from the skin edge. The patient tolerated the procedure well and there were no complications. Instrument sponge and needle counts were correct at the end of the case. The patient was taken to the recovery room in stable condition.  Fabienne Bruns, MD [facility] Office: [phone] Pager: [phone]

## 2012-12-16 NOTE — Progress Notes (Signed)
  [facility] Progress Note    Subjective: For surgery today at 3pm, no complaints   Exam  Blood pressure 126/60, pulse 59, temperature 98.6 F (37 C), temperature source Oral, resp. rate 18, height 5\' 10"  (1.778 m), weight 93.2 kg (205 lb 7.5 oz), SpO2 95.00%.  gen: alert, WD, WN, no distress  neck: + jvd, no LAN  chest: clear bilat cor: regular, no M or rub  abd: soft, nontender, nd, no ascites  ext: no leg edema, R foot wrapped neuro: alert, ox3, nonfocal  access: LUA AVF patent   Dialysis orders: [facility]  4hrs F180 400/800 2/2.25 [place] 88.5kg Heparin 8700 LUA AVF  Hectorol 2ug Epo 4200 Venofer none  Last labs: tsat 18% ferritin 541 phos 6 pth 422   Assessment:  1. Osteomyelitis R foot (R 1st & 2nd toe amp 10/28): for surgery today, IV abx, will postpone HD til tomorrow 2. ESRD HD tomorrow and Friday 3. Anemia of ckd- Hb 10.0, hold esa for now 4. MBD (metabolic bone disease)- cont vit D 5. HTN/volume- up 5-6kg by wts, no vol excess on exam, UF as tolerate, cont bp meds 6. DM 2- per primary 7. Hx MGUS    Vinson Moselle MD  pager [phone]    cell [phone]  12/16/2012, 9:48 AM   Recent Labs Lab 12/14/12 2314 12/15/12 0524  NA 137 136  K 3.7 3.7  CL 94* 94*  CO2 30 30  GLUCOSE 248* 304*  BUN 22 27*  CREATININE 5.68* 6.47*  CALCIUM 8.8 8.2*    Recent Labs Lab 12/14/12 2314 12/15/12 0524  AST 11 10  ALT 9 7  ALKPHOS 85 69  BILITOT 0.3 0.3  PROT 7.7 6.9  ALBUMIN 3.3* 2.8*    Recent Labs Lab 12/14/12 2314 12/15/12 0524  WBC 8.4 7.0  NEUTROABS 6.4 4.5  HGB 11.1* 10.0*  HCT 35.5* 31.7*  MCV 86.2 84.1  PLT 257 247   . calcium acetate  2,001 mg Oral TID WC  . doxazosin  2 mg Oral QHS  . doxercalciferol  2 mcg Intravenous Q M,W,F-HD  . gabapentin  400 mg Oral QHS  . insulin aspart  0-9 Units Subcutaneous TID WC  . levothyroxine  175 mcg Oral QAC breakfast  . linagliptin  5 mg Oral Daily  . multivitamin  1 tablet Oral QHS  .  nebivolol  5 mg Oral Daily  . piperacillin-tazobactam (ZOSYN)  IV  2.25 g Intravenous Q8H  . sodium chloride  3 mL Intravenous Q12H  . vancomycin  1,000 mg Intravenous Q M,W,F-HD     acetaminophen, acetaminophen, ondansetron (ZOFRAN) IV, ondansetron, oxyCODONE

## 2012-12-16 NOTE — Preoperative (Signed)
Beta Blockers   Reason not to administer Beta Blockers:Patient received BB @ 1130 today

## 2012-12-16 NOTE — Anesthesia Procedure Notes (Addendum)
Performed by: Coralee Rud   Procedure Name: LMA Insertion Date/Time: 12/16/2012 4:30 PM Performed by: Coralee Rud Pre-anesthesia Checklist: Patient identified, Emergency Drugs available, Suction available and Patient being monitored Patient Re-evaluated:Patient Re-evaluated prior to inductionOxygen Delivery Method: Circle system utilized Preoxygenation: Pre-oxygenation with 100% oxygen Ventilation: Mask ventilation without difficulty and Oral airway inserted - appropriate to patient size LMA: LMA with gastric port inserted LMA Size: 5.0 Dental Injury: Teeth and Oropharynx as per pre-operative assessment

## 2012-12-16 NOTE — Anesthesia Postprocedure Evaluation (Signed)
  Anesthesia Post-op Note  Patient: Jack [name]  Procedure(s) Performed: Procedure(s): TRANSMETATARSAL AMPUTATION AND VAC PLACEMENT (Left)  Patient Location: PACU  Anesthesia Type:General  Level of Consciousness: awake  Airway and Oxygen Therapy: Patient Spontanous Breathing  Post-op Pain: mild  Post-op Assessment: Post-op Vital signs reviewed, Patient's Cardiovascular Status Stable, Respiratory Function Stable, Patent Airway, No signs of Nausea or vomiting and Pain level controlled  Post-op Vital Signs: Reviewed and stable  Complications: No apparent anesthesia complications

## 2012-12-16 NOTE — Anesthesia Preprocedure Evaluation (Signed)
Anesthesia Evaluation  Patient identified by MRN, date of birth, ID band Patient awake    Reviewed: Allergy & Precautions, H&P , NPO status , Patient's Chart, lab work & pertinent test results, reviewed documented beta blocker date and time   Airway Mallampati: II TM Distance: >3 FB Neck ROM: full    Dental  (+) Teeth Intact and Missing,    Pulmonary shortness of breath and with exertion, pneumonia -, resolved, former smoker,  03-19-12  Chest x-ray   IMPRESSION:   Interval placement of a right IJ approach hemodialysis catheter with the catheter tip at the superior cavoatrial junction.  No evidence of pneumothorax.    breath sounds clear to auscultation        Cardiovascular hypertension, On Medications and On Home Beta Blockers + Peripheral Vascular Disease + Valvular Problems/Murmurs Rhythm:Irregular Rate:Normal  18-Mar-2012 09:21:28 [facility]/ED ROUTINE RECORD AGE IS NOT ENTERED, ASSUMED TO BE 67 YEARS OLD FOR PURPOSE OF ECG INTERPRETATION ACCELERATED JUNCTIONAL ESCAPE RHYTHM ~ absent P waves, V-rate 50-70 LEFT ANTERIOR FASCICULAR BLOCK ~ axis(240,-40), init forces inf EARLY PRECORDIAL R/S TRANSITION ~ QRS area positive in V2 CONSIDER ANTERIOR INFARCT ~ diminished R <0.52mV V3 NONSPECIFIC T ABNORMALITIES, LATERAL LEADS ~ T <-0.23mV, I aVL V5 V6 Abnormal ECG 64mm/s 46mm/mV 150Hz  8.0.1 12SL 235 CID: [id number] Referred by: Confirmed By: Bethann Berkshire MD   Neuro/Psych negative neurological ROS  negative psych ROS   GI/Hepatic negative GI ROS, Neg liver ROS,   Endo/Other  diabetes, Poorly ControlledHypothyroidism Glucose 297 @ 07:25  Renal/GU ESRF and DialysisRenal diseaseTu-th-Sat     Musculoskeletal  (+) Arthritis -, Osteoarthritis,    Abdominal   Peds  Hematology  (+) anemia ,   Anesthesia Other Findings See surgeon's H&P   Reproductive/Obstetrics negative OB ROS                             Anesthesia Physical Anesthesia Plan  ASA: III  Anesthesia Plan: General   Post-op Pain Management:    Induction: Intravenous  Airway Management Planned: LMA  Additional Equipment:   Intra-op Plan:   Post-operative Plan: Extubation in OR  Informed Consent: I have reviewed the patients History and Physical, chart, labs and discussed the procedure including the risks, benefits and alternatives for the proposed anesthesia with the patient or authorized representative who has indicated his/her understanding and acceptance.   Dental advisory given  Plan Discussed with: CRNA and Surgeon  Anesthesia Plan Comments:         Anesthesia Quick Evaluation

## 2012-12-16 NOTE — Transfer of Care (Signed)
Immediate Anesthesia Transfer of Care Note  Patient: Jack [name]  Procedure(s) Performed: Procedure(s): TRANSMETATARSAL AMPUTATION AND VAC PLACEMENT (Left)  Patient Location: PACU  Anesthesia Type:General  Level of Consciousness: awake, alert , oriented and patient cooperative  Airway & Oxygen Therapy: Patient Spontanous Breathing and Patient connected to nasal cannula oxygen  Post-op Assessment: Report given to PACU RN, Post -op Vital signs reviewed and stable, Patient moving all extremities and Patient moving all extremities X 4  Post vital signs: Reviewed and stable  Complications: No apparent anesthesia complications

## 2012-12-16 NOTE — Progress Notes (Signed)
[facility]                                                                                Patient Demographics  Jack [name], is a 67 y.o. male, DOB - [birth year], ZOX:[id number]  Admit date - 12/14/2012   Admitting Physician Eduard Clos, MD  Outpatient Primary MD for the patient is Geraldo Pitter, MD  LOS - 2   Chief Complaint  Patient presents with  . Fatigue        Assessment & Plan  Principal Problem:   Osteomyelitis of foot, acute Active Problems:   Hypothyroidism   End stage renal disease   Diabetes mellitus   Osteomyelitis of foot  Osteomyelitis of the Right foot -Surgery planned for this afternoon -Continue vancomycin and zosyn -Continue pain control -Blood cultures show no growth -Physical therapy will be consulted after surgery.  End-stage renal disease requiring hemodialysis -Patient currently dialyzes on Monday Wednesday and Friday, however held today due to to upcoming surgery. Patient will dialyze on Thursday and Friday. -Nephrology following  Hypertension -Currently stable, continue bystolic and Cardura  Diabetes mellitus -Continue insulin sliding scale with CBG monitoring, and tradjenta -Will also consult diabetes educator  Hypothyroidism -Continue Synthroid  History of MGUS -Stable  Anemia secondary to chronic kidney disease -stable, H/H 10/31.7, will continue to monitor  Code Status: Full  Family Communication: Daughter at bedside.  Disposition Plan: Admitted. Patient will likely need home health services once medically stable for discharge.   Procedures  Right Transmetatarsal amputation  Consults   Nephrology Vascular surgery  DVT Prophylaxis SCDs   Lab Results  Component Value Date   PLT 247 12/15/2012    Medications  Scheduled Meds: . calcium acetate  2,001 mg Oral TID WC  . doxazosin  2 mg Oral QHS  . doxercalciferol  2 mcg Intravenous Q M,W,F-HD  . gabapentin  400 mg Oral QHS  . insulin aspart   0-9 Units Subcutaneous TID WC  . levothyroxine  175 mcg Oral QAC breakfast  . linagliptin  5 mg Oral Daily  . multivitamin  1 tablet Oral QHS  . nebivolol  5 mg Oral Daily  . piperacillin-tazobactam (ZOSYN)  IV  2.25 g Intravenous Q8H  . sodium chloride  3 mL Intravenous Q12H  . vancomycin  1,000 mg Intravenous Q M,W,F-HD   Continuous Infusions:  PRN Meds:.acetaminophen, acetaminophen, ondansetron (ZOFRAN) IV, ondansetron, oxyCODONE  Antibiotics    Anti-infectives   Start     Dose/Rate Route Frequency Ordered Stop   12/16/12 1200  vancomycin (VANCOCIN) IVPB 1000 mg/200 mL premix     1,000 mg 200 mL/hr over 60 Minutes Intravenous Every M-W-F (Hemodialysis) 12/15/12 1406     12/15/12 0800  piperacillin-tazobactam (ZOSYN) IVPB 2.25 g     2.25 g 100 mL/hr over 30 Minutes Intravenous 3 times per day 12/15/12 0515     12/14/12 2315  vancomycin (VANCOCIN) IVPB 1000 mg/200 mL premix     1,000 mg 200 mL/hr over 60 Minutes Intravenous  Once 12/14/12 2314 12/15/12 0148   12/14/12 2315  piperacillin-tazobactam (ZOSYN) IVPB 3.375 g     3.375 g 100 mL/hr over 30 Minutes Intravenous  Once 12/14/12  2314 12/15/12 0011       Time Spent in minutes   30 minutes   Gidget Quizhpi D.O. on 12/16/2012 at 10:37 AM  Between 7am to 7pm - Pager - [phone]  After 7pm go to [email or web address] - password [email or web address]  And look for the night coverage person covering for me after hours  [facility] Office  [phone]    Subjective:   Jack [name] seen and examined today.  Patient currently anxious due to his upcoming surgery. Patient has no complaints today. Patient denies dizziness, chest pain, shortness of breath, abdominal pain, N/V/D/C, new weakness, numbess, tingling.    Objective:   Filed Vitals:   12/15/12 0620 12/15/12 1457 12/15/12 2140 12/16/12 0649  BP: 106/52 105/60 125/62 126/60  Pulse: 65 56 62 59  Temp: 98.6 F (37 C) 99.8 F (37.7 C) 98.9 F (37.2 C) 98.6 F (37  C)  TempSrc:  Oral    Resp: 18 18 18 18   Height:      Weight: 93.2 kg (205 lb 7.5 oz)     SpO2: 99% 97% 93% 95%    Wt Readings from Last 3 Encounters:  12/15/12 93.2 kg (205 lb 7.5 oz)  12/15/12 93.2 kg (205 lb 7.5 oz)  12/03/12 90.629 kg (199 lb 12.8 oz)     Intake/Output Summary (Last 24 hours) at 12/16/12 1037 Last data filed at 12/16/12 0800  Gross per 24 hour  Intake    470 ml  Output      0 ml  Net    470 ml    Exam  General: Well developed, well nourished, NAD, appears stated age  HEENT: NCAT, PERRLA, EOMI, Anicteic Sclera, mucous membranes moist. No pharyngeal erythema or exudates  Neck: Supple, no masses  Cardiovascular: S1 S2 auscultated, no rubs, murmurs or gallops. Regular rate and rhythm.  Respiratory: Clear to auscultation bilaterally with equal chest rise  Abdomen: Soft, nontender, nondistended, + bowel sounds  Extremities: warm dry without cyanosis clubbing or edema in the left lower extremity. Right lower extremity currently wrapped. Left upper extremity AV fistula in place, with good thrill and bruit  Neuro: AAOx3, cranial nerves grossly intact.   Skin: Without rashes exudates or nodules  Psych: Normal affect and demeanor with intact judgement and insight  Data Review   Micro Results Recent Results (from the past 240 hour(s))  CULTURE, BLOOD (ROUTINE X 2)     Status: None   Collection Time    12/14/12 11:25 PM      Result Value Range Status   Specimen Description BLOOD HAND RIGHT   Final   Special Requests BOTTLES DRAWN AEROBIC ONLY 10CC   Final   Culture  Setup Time     Final   Value: 12/15/2012 04:33     Performed at [facility]   Culture     Final   Value:        BLOOD CULTURE RECEIVED NO GROWTH TO DATE CULTURE WILL BE HELD FOR 5 DAYS BEFORE ISSUING A FINAL NEGATIVE REPORT     Performed at [facility]   Report Status PENDING   Incomplete  CULTURE, BLOOD (ROUTINE X 2)     Status: None   Collection Time    12/14/12  11:31 PM      Result Value Range Status   Specimen Description BLOOD FOREARM RIGHT   Final   Special Requests BOTTLES DRAWN AEROBIC ONLY 5CC   Final   Culture  Setup Time  Final   Value: 12/15/2012 04:33     Performed at [facility]   Culture     Final   Value:        BLOOD CULTURE RECEIVED NO GROWTH TO DATE CULTURE WILL BE HELD FOR 5 DAYS BEFORE ISSUING A FINAL NEGATIVE REPORT     Performed at [facility]   Report Status PENDING   Incomplete  SURGICAL PCR SCREEN     Status: None   Collection Time    12/16/12  5:51 AM      Result Value Range Status   MRSA, PCR NEGATIVE  NEGATIVE Final   Staphylococcus aureus NEGATIVE  NEGATIVE Final   Comment:            The Xpert SA Assay (FDA     approved for NASAL specimens     in patients over 73 years of age),     is one component of     a comprehensive surveillance     program.  Test performance has     been validated by [employer] for patients greater     than or equal to 90 year old.     It is not intended     to diagnose infection nor to     guide or monitor treatment.    Radiology Reports Dg Chest 2 View  12/15/2012   CLINICAL DATA:  Fever.  EXAM: CHEST  2 VIEW  COMPARISON:  Chest radiograph November 10, 2012  FINDINGS: Cardiomediastinal silhouette is nonsuspicious, mildly calcified aortic knob. Persistently elevated right hemidiaphragm with minimal right lung base strandy densities. The lungs are otherwise clear without pleural effusions or focal consolidations. Pulmonary vasculature is unremarkable. Trachea projects midline and there is no pneumothorax. Soft tissue planes and included osseous structures are nonsuspicious.  IMPRESSION: Similar elevated right hemidiaphragm with minimal right lung base atelectasis.   Electronically Signed   By: Awilda Metro   On: 12/15/2012 00:37   Dg Foot Complete Right  12/15/2012   CLINICAL DATA:  Pain and drainage at close, history of diabetes, evaluate for  osteomyelitis.  EXAM: RIGHT FOOT COMPLETE - 3+ VIEW  COMPARISON:  Right foot radiograph November 14, 2012  FINDINGS: Status post 1st and 2nd digit amputation at metacarpophalangeal joint space. Erosive changes of the distal 1st metatarsus is similar, with increasing mild periosteal reaction medially. Worsening focal osteopenia within the 2nd metatarsal head best appreciated on the oblique view.  No acute fracture deformity. No dislocation. Type 2 navicular bone. Dense vascular calcifications. Irregular soft tissue at the level of the amputation with suspected ulceration, no subcutaneous emphysema nor radiopaque foreign bodies.  IMPRESSION: Status post 1st and 2nd toe amputations, with increasing periosteal reaction of the distal 1st metatarsus and increasing osteopenia of the 2nd metatarsal head concerning for osteomyelitis.   Electronically Signed   By: Awilda Metro   On: 12/15/2012 00:41    CBC  Recent Labs Lab 12/14/12 2314 12/15/12 0524  WBC 8.4 7.0  HGB 11.1* 10.0*  HCT 35.5* 31.7*  PLT 257 247  MCV 86.2 84.1  MCH 26.9 26.5  MCHC 31.3 31.5  RDW 15.2 15.4  LYMPHSABS 1.0 1.2  MONOABS 1.0 1.1*  EOSABS 0.1 0.1  BASOSABS 0.0 0.1    Chemistries   Recent Labs Lab 12/14/12 2314 12/15/12 0524  NA 137 136  K 3.7 3.7  CL 94* 94*  CO2 30 30  GLUCOSE 248* 304*  BUN 22 27*  CREATININE 5.68* 6.47*  CALCIUM 8.8 8.2*  AST 11 10  ALT 9 7  ALKPHOS 85 69  BILITOT 0.3 0.3   ------------------------------------------------------------------------------------------------------------------ estimated creatinine clearance is 12.4 ml/min (by C-G formula based on Cr of 6.47). ------------------------------------------------------------------------------------------------------------------ No results found for this basename: HGBA1C,  in the last 72 hours ------------------------------------------------------------------------------------------------------------------ No results found for  this basename: CHOL, HDL, LDLCALC, TRIG, CHOLHDL, LDLDIRECT,  in the last 72 hours ------------------------------------------------------------------------------------------------------------------  Recent Labs  12/15/12 0524  TSH 1.523   ------------------------------------------------------------------------------------------------------------------ No results found for this basename: VITAMINB12, FOLATE, FERRITIN, TIBC, IRON, RETICCTPCT,  in the last 72 hours  Coagulation profile No results found for this basename: INR, PROTIME,  in the last 168 hours  No results found for this basename: DDIMER,  in the last 72 hours  Cardiac Enzymes No results found for this basename: CK, CKMB, TROPONINI, MYOGLOBIN,  in the last 168 hours ------------------------------------------------------------------------------------------------------------------ No components found with this basename: POCBNP,

## 2012-12-17 ENCOUNTER — Encounter: Payer: Medicare Other | Admitting: Vascular Surgery

## 2012-12-17 LAB — CBC
HCT: 29.5 % — ABNORMAL LOW (ref 39.0–52.0)
Hemoglobin: 9.1 g/dL — ABNORMAL LOW (ref 13.0–17.0)
MCV: 85.3 fL (ref 78.0–100.0)
Platelets: 236 10*3/uL (ref 150–400)
RBC: 3.46 MIL/uL — ABNORMAL LOW (ref 4.22–5.81)
WBC: 8.1 10*3/uL (ref 4.0–10.5)

## 2012-12-17 LAB — GLUCOSE, CAPILLARY
Glucose-Capillary: 171 mg/dL — ABNORMAL HIGH (ref 70–99)
Glucose-Capillary: 140 mg/dL — ABNORMAL HIGH (ref 70–99)

## 2012-12-17 MED ORDER — LIDOCAINE-PRILOCAINE 2.5-2.5 % EX CREA
1.0000 "application " | TOPICAL_CREAM | CUTANEOUS | Status: DC | PRN
  Filled 2012-12-17: qty 5

## 2012-12-17 MED ORDER — PENTAFLUOROPROP-TETRAFLUOROETH EX AERO: 1.0000 "application " | INHALATION_SPRAY | CUTANEOUS | Status: DC | PRN

## 2012-12-17 MED ORDER — SODIUM CHLORIDE 0.9 % IV SOLN
125.0000 mg | Freq: Once | INTRAVENOUS | Status: AC
  Administered 2012-12-17: 125 mg via INTRAVENOUS
  Filled 2012-12-17: qty 10

## 2012-12-17 MED ORDER — SODIUM CHLORIDE 0.9 % IV SOLN: 100.0000 mL | INTRAVENOUS | Status: DC | PRN

## 2012-12-17 MED ORDER — HEPARIN SODIUM (PORCINE) 1000 UNIT/ML DIALYSIS: 2000.0000 [IU] | INTRAMUSCULAR | Status: DC | PRN

## 2012-12-17 MED ORDER — DARBEPOETIN ALFA-POLYSORBATE 60 MCG/0.3ML IJ SOLN: 60.0000 ug | INTRAMUSCULAR | Status: DC

## 2012-12-17 MED ORDER — DARBEPOETIN ALFA-POLYSORBATE 60 MCG/0.3ML IJ SOLN
60.0000 ug | INTRAMUSCULAR | Status: AC
  Administered 2012-12-17: 60 ug via INTRAVENOUS
  Filled 2012-12-17: qty 0.3

## 2012-12-17 MED ORDER — ALTEPLASE 2 MG IJ SOLR
2.0000 mg | Freq: Once | INTRAMUSCULAR | Status: DC | PRN
  Filled 2012-12-17: qty 2

## 2012-12-17 MED ORDER — OXYCODONE HCL 5 MG PO TABS
ORAL_TABLET | ORAL | Status: AC
  Filled 2012-12-17: qty 1

## 2012-12-17 MED ORDER — HEPARIN SODIUM (PORCINE) 1000 UNIT/ML DIALYSIS: 1000.0000 [IU] | INTRAMUSCULAR | Status: DC | PRN

## 2012-12-17 MED ORDER — DARBEPOETIN ALFA-POLYSORBATE 200 MCG/0.4ML IJ SOLN
200.0000 ug | INTRAMUSCULAR | Status: DC
  Filled 2012-12-17: qty 0.4

## 2012-12-17 MED ORDER — NEPRO/CARBSTEADY PO LIQD
237.0000 mL | ORAL | Status: DC | PRN
  Filled 2012-12-17: qty 237

## 2012-12-17 MED ORDER — DOXERCALCIFEROL 4 MCG/2ML IV SOLN
INTRAVENOUS | Status: AC
  Administered 2012-12-17: 2 ug via INTRAVENOUS
  Filled 2012-12-17: qty 2

## 2012-12-17 MED ORDER — LIDOCAINE HCL (PF) 1 % IJ SOLN: 5.0000 mL | INTRAMUSCULAR | Status: DC | PRN

## 2012-12-17 MED ORDER — DARBEPOETIN ALFA-POLYSORBATE 60 MCG/0.3ML IJ SOLN
INTRAMUSCULAR | Status: AC
  Administered 2012-12-17: 60 ug
  Filled 2012-12-17: qty 0.3

## 2012-12-17 NOTE — Progress Notes (Signed)
[facility]  Subjective  - No complaints   Objective 118/62 58 98.4 F (36.9 C) (Oral) 18 98%  Intake/Output Summary (Last 24 hours) at 12/17/12 0835 Last data filed at 12/17/12 0630  Gross per 24 hour  Intake    990 ml  Output    340 ml  Net    650 ml   VAC in place right foot  Assessment/Planning: Will need home VAC Can go home from my standpoint after that is set up He should not need antibiotics at this point since infected area was removed  Jack [name] E 12/17/2012 8:35 AM --  Laboratory Lab Results:  Recent Labs  12/15/12 0524 12/16/12 1558 12/17/12 0452  WBC 7.0  --  8.1  HGB 10.0* 11.2* 9.1*  HCT 31.7* 33.0* 29.5*  PLT 247  --  236   BMET  Recent Labs  12/14/12 2314 12/15/12 0524 12/16/12 1558  NA 137 136 137  K 3.7 3.7 4.1  CL 94* 94*  --   CO2 30 30  --   GLUCOSE 248* 304* 103*  BUN 22 27*  --   CREATININE 5.68* 6.47*  --   CALCIUM 8.8 8.2*  --     COAG Lab Results  Component Value Date   INR 1.01 11/10/2012   INR 3.10* 06/26/2012   INR 0.96 06/10/2012   PROTIME 16.8* 05/20/2012   PROTIME 14.4* 05/13/2012   PROTIME 13.2 05/06/2012   No results found for this basename: PTT

## 2012-12-17 NOTE — Care Management Note (Signed)
Case Manager spoke with patient's daughter concerning her dad's home health needs and equipment needs. Patient is active with [facility]. CM contacted Jodene Nam with [facility] to inform her of need for [facility] for wound Vac care. CM spoke with Dr. Darrick Penna to get wound measurements: 10cm x 3cm. Orders for wound vac faxed to [facility] with [employer] @ [phone]. Wound Vac to be delivered to patient's room. Vance Peper, RNBSN

## 2012-12-17 NOTE — Procedures (Signed)
I was present at this session.  I have reviewed the session itself and made appropriate changes.  Access press ok. bp stable.    Taleen Prosser L 12/4/20144:51 PM

## 2012-12-17 NOTE — Progress Notes (Signed)
Subjective:  Sitting in recliner, no complaints s/p surgery yesterday  Objective: Vital signs in last 24 hours: Temp:  [97.3 F (36.3 C)-98.7 F (37.1 C)] 98.4 F (36.9 C) (12/04 0636) Pulse Rate:  [51-64] 58 (12/04 0636) Resp:  [12-18] 18 (12/04 0636) BP: (100-168)/(60-76) 118/62 mmHg (12/04 0636) SpO2:  [94 %-100 %] 98 % (12/04 0636) Weight:  [84.097 kg (185 lb 6.4 oz)] 84.097 kg (185 lb 6.4 oz) (12/04 0500) Weight change:   Intake/Output from previous day: 12/03 0701 - 12/04 0700 In: 1045 [P.O.:150; I.V.:695; IV Piggyback:200] Out: 340 [Drains:140; Blood:200] Intake/Output this shift: Total I/O In: 3 [I.V.:3] Out: -   Lab Results:  Recent Labs  12/15/12 0524 12/16/12 1558 12/17/12 0452  WBC 7.0  --  8.1  HGB 10.0* 11.2* 9.1*  HCT 31.7* 33.0* 29.5*  PLT 247  --  236   BMET:  Recent Labs  12/14/12 2314 12/15/12 0524 12/16/12 1558  NA 137 136 137  K 3.7 3.7 4.1  CL 94* 94*  --   CO2 30 30  --   GLUCOSE 248* 304* 103*  BUN 22 27*  --   CREATININE 5.68* 6.47*  --   CALCIUM 8.8 8.2*  --   ALBUMIN 3.3* 2.8*  --    No results found for this basename: PTH,  in the last 72 hours Iron Studies: No results found for this basename: IRON, TIBC, TRANSFERRIN, FERRITIN,  in the last 72 hours  EXAM: Gen:  alert, in no apparent distress Resp: clear bilat Cardio:  RRR without murmur or rub GI:  + BS, soft and nontender Extremities:  R transmet amputation with wound vac, trace edema Access:  AVF @ LUA with + bruit  Dialysis orders: [facility]  4hrs   F180   400/800   2/2.25 [place]    88.5kg   Heparin 8700    LUA AVF  Hectorol 2ug    Epo 4200    Venofer none  Last labs: tsat 18% ferritin 541 phos 6 pth 422   Assessment/Plan: 1. Osteomyelitis R foot - s/p R transmetatarsal amputation per Dr. Darrick Penna yesterday, stable with wound vac, will not require antibiotics. 2. ESRD - HD today and tomorrow 3. HTN/Volume - BP 118/62 on Bystolic 5 mg qd, Doxazosin 2 mg qhs; wt 84.1 kg,  below EDW. No gross vol excess, UF 1-2 kg as tolerated today with HD 4. Anemia - Hgb down to 9.1 on outpatient Epogen.  Aranesp today, start Fe series. 5. Sec HPT - Ca 8.8; Hectorol 2 mcg, Phoslo 3 with meals. 6. Nutrition - renal diet and vitamin. 7. DM Type 2 - per primary. 8. Hx MGUS   LOS: 3 days   LYLES,CHARLES 12/17/2012,10:06 AM  I have seen and examined patient, discussed with PA and agree with assessment and plan as outlined above with additions as indicated. Vinson Moselle MD pager [phone]    cell [phone] 12/17/2012, 11:18 AM

## 2012-12-17 NOTE — Progress Notes (Signed)
[facility]                                                                                Patient Demographics  Jack [name], is a 67 y.o. male, DOB - May 05, 1945, ZOX:[id number]  Admit date - 12/14/2012   Admitting Physician Eduard Clos, MD  Outpatient Primary MD for the patient is Geraldo Pitter, MD  LOS - 3   Chief Complaint  Patient presents with  . Fatigue        Assessment & Plan  Principal Problem:   Osteomyelitis of foot, acute Active Problems:   Hypothyroidism   End stage renal disease   Diabetes mellitus   Osteomyelitis of foot  Osteomyelitis of the Right foot -Status post right foot transmetatarsal amputation for gangrene -Continue vancomycin and zosyn, will likely discontinue antibiotics tomorrow. Patient will not need to be continued on home antibiotics. -Continue pain control -Blood cultures show no growth -Physical and occupational therapy will be consulted -Patient has wound VAC in place and will need to be discharged with this  End-stage renal disease requiring hemodialysis -Patient currently dialyzes on Monday Wednesday and Friday, -Nephrology following -Patient will dialyze today Thursday, and Friday  Hypertension -Currently stable, continue bystolic and Cardura  Diabetes mellitus -Continue insulin sliding scale with CBG monitoring, and tradjenta -Will also consult diabetes educator  Hypothyroidism -Continue Synthroid  History of MGUS -Stable  Anemia secondary to chronic kidney disease -stable, H/H 9.1/29.5, will continue to monitor  Code Status: Full  Family Communication: Daughter at bedside.  Disposition Plan: Admitted. Patient will likely need home health services once medically stable for discharge.   Procedures  Right Transmetatarsal amputation  Consults   Nephrology Vascular surgery  DVT Prophylaxis SCDs   Lab Results  Component Value Date   PLT 236 12/17/2012    Medications  Scheduled Meds: .  calcium acetate  2,001 mg Oral TID WC  . darbepoetin (ARANESP) injection - DIALYSIS  200 mcg Intravenous Q Thu-HD  . doxazosin  2 mg Oral QHS  . doxercalciferol  2 mcg Intravenous Q M,W,F-HD  . ferric gluconate (FERRLECIT/NULECIT) IV  125 mg Intravenous Once  . gabapentin  400 mg Oral QHS  . insulin aspart  0-9 Units Subcutaneous TID WC  . levothyroxine  175 mcg Oral QAC breakfast  . linagliptin  5 mg Oral Daily  . multivitamin  1 tablet Oral QHS  . nebivolol  5 mg Oral Daily  . piperacillin-tazobactam (ZOSYN)  IV  2.25 g Intravenous Q8H  . sodium chloride  3 mL Intravenous Q12H  . [START ON 12/18/2012] vancomycin  1,000 mg Intravenous Q M,W,F-HD  . vancomycin  1,000 mg Intravenous Q Thu-HD   Continuous Infusions:  PRN Meds:.acetaminophen, acetaminophen, HYDROmorphone (DILAUDID) injection, ondansetron (ZOFRAN) IV, ondansetron, oxyCODONE  Antibiotics    Anti-infectives   Start     Dose/Rate Route Frequency Ordered Stop   12/18/12 1200  vancomycin (VANCOCIN) IVPB 1000 mg/200 mL premix     1,000 mg 200 mL/hr over 60 Minutes Intravenous Every M-W-F (Hemodialysis) 12/16/12 1118     12/17/12 1200  vancomycin (VANCOCIN) IVPB 1000 mg/200 mL premix     1,000 mg 200 mL/hr over 60  Minutes Intravenous Every Thu (Hemodialysis) 12/16/12 1118 12/24/12 1159   12/16/12 1200  vancomycin (VANCOCIN) IVPB 1000 mg/200 mL premix  Status:  Discontinued     1,000 mg 200 mL/hr over 60 Minutes Intravenous Every M-W-F (Hemodialysis) 12/15/12 1406 12/16/12 1118   12/15/12 0800  piperacillin-tazobactam (ZOSYN) IVPB 2.25 g     2.25 g 100 mL/hr over 30 Minutes Intravenous 3 times per day 12/15/12 0515     12/14/12 2315  vancomycin (VANCOCIN) IVPB 1000 mg/200 mL premix     1,000 mg 200 mL/hr over 60 Minutes Intravenous  Once 12/14/12 2314 12/15/12 0148   12/14/12 2315  piperacillin-tazobactam (ZOSYN) IVPB 3.375 g     3.375 g 100 mL/hr over 30 Minutes Intravenous  Once 12/14/12 2314 12/15/12 0011        Time Spent in minutes   25 minutes   Channing Savich D.O. on 12/17/2012 at 10:31 AM  Between 7am to 7pm - Pager - [phone]  After 7pm go to [email or web address] - password [email or web address]  And look for the night coverage person covering for me after hours  [facility] Office  [phone]    Subjective:   Jack [name] seen and examined today. Patient has no complaints today and inquired and he'll be discharged. Patient denies dizziness, chest pain, shortness of breath, abdominal pain, N/V/D/C, new weakness, numbess, tingling.    Objective:   Filed Vitals:   12/16/12 2143 12/17/12 0152 12/17/12 0500 12/17/12 0636  BP: 100/62 104/66  118/62  Pulse: 56 51  58  Temp: 98.1 F (36.7 C) 98.7 F (37.1 C)  98.4 F (36.9 C)  TempSrc: Oral Oral  Oral  Resp: 18 18  18   Height:      Weight:   84.097 kg (185 lb 6.4 oz)   SpO2: 97% 98%  98%    Wt Readings from Last 3 Encounters:  12/17/12 84.097 kg (185 lb 6.4 oz)  12/17/12 84.097 kg (185 lb 6.4 oz)  12/03/12 90.629 kg (199 lb 12.8 oz)     Intake/Output Summary (Last 24 hours) at 12/17/12 1031 Last data filed at 12/17/12 0959  Gross per 24 hour  Intake    993 ml  Output    340 ml  Net    653 ml    Exam  General: Well developed, well nourished, NAD, appears stated age  HEENT: NCAT, PERRLA, EOMI, Anicteic Sclera, mucous membranes moist. No pharyngeal erythema or exudates  Neck: Supple, no masses  Cardiovascular: S1 S2 auscultated, no rubs, murmurs or gallops. Regular rate and rhythm.  Respiratory: Clear to auscultation bilaterally with equal chest rise  Abdomen: Soft, nontender, nondistended, + bowel sounds  Extremities: warm dry without cyanosis clubbing or edema in the left lower extremity. Left upper extremity AV fistula in place, with good thrill and bruit.  Right lower extremity shows wound VAC in place  Neuro: AAOx3, cranial nerves grossly intact.   Skin: Without rashes exudates or nodules  Psych:  Normal affect and demeanor with intact judgement and insight  Data Review   Micro Results Recent Results (from the past 240 hour(s))  CULTURE, BLOOD (ROUTINE X 2)     Status: None   Collection Time    12/14/12 11:25 PM      Result Value Range Status   Specimen Description BLOOD HAND RIGHT   Final   Special Requests BOTTLES DRAWN AEROBIC ONLY 10CC   Final   Culture  Setup Time     Final   Value: 12/15/2012  04:33     Performed at [facility]     Final   Value:        BLOOD CULTURE RECEIVED NO GROWTH TO DATE CULTURE WILL BE HELD FOR 5 DAYS BEFORE ISSUING A FINAL NEGATIVE REPORT     Performed at [facility]   Report Status PENDING   Incomplete  CULTURE, BLOOD (ROUTINE X 2)     Status: None   Collection Time    12/14/12 11:31 PM      Result Value Range Status   Specimen Description BLOOD FOREARM RIGHT   Final   Special Requests BOTTLES DRAWN AEROBIC ONLY 5CC   Final   Culture  Setup Time     Final   Value: 12/15/2012 04:33     Performed at [facility]   Culture     Final   Value:        BLOOD CULTURE RECEIVED NO GROWTH TO DATE CULTURE WILL BE HELD FOR 5 DAYS BEFORE ISSUING A FINAL NEGATIVE REPORT     Performed at [facility]   Report Status PENDING   Incomplete  SURGICAL PCR SCREEN     Status: None   Collection Time    12/16/12  5:51 AM      Result Value Range Status   MRSA, PCR NEGATIVE  NEGATIVE Final   Staphylococcus aureus NEGATIVE  NEGATIVE Final   Comment:            The Xpert SA Assay (FDA     approved for NASAL specimens     in patients over 5 years of age),     is one component of     a comprehensive surveillance     program.  Test performance has     been validated by [employer] for patients greater     than or equal to 37 year old.     It is not intended     to diagnose infection nor to     guide or monitor treatment.    Radiology Reports Dg Chest 2 View  12/15/2012   CLINICAL DATA:  Fever.  EXAM: CHEST   2 VIEW  COMPARISON:  Chest radiograph November 10, 2012  FINDINGS: Cardiomediastinal silhouette is nonsuspicious, mildly calcified aortic knob. Persistently elevated right hemidiaphragm with minimal right lung base strandy densities. The lungs are otherwise clear without pleural effusions or focal consolidations. Pulmonary vasculature is unremarkable. Trachea projects midline and there is no pneumothorax. Soft tissue planes and included osseous structures are nonsuspicious.  IMPRESSION: Similar elevated right hemidiaphragm with minimal right lung base atelectasis.   Electronically Signed   By: Awilda Metro   On: 12/15/2012 00:37   Dg Foot Complete Right  12/15/2012   CLINICAL DATA:  Pain and drainage at close, history of diabetes, evaluate for osteomyelitis.  EXAM: RIGHT FOOT COMPLETE - 3+ VIEW  COMPARISON:  Right foot radiograph November 14, 2012  FINDINGS: Status post 1st and 2nd digit amputation at metacarpophalangeal joint space. Erosive changes of the distal 1st metatarsus is similar, with increasing mild periosteal reaction medially. Worsening focal osteopenia within the 2nd metatarsal head best appreciated on the oblique view.  No acute fracture deformity. No dislocation. Type 2 navicular bone. Dense vascular calcifications. Irregular soft tissue at the level of the amputation with suspected ulceration, no subcutaneous emphysema nor radiopaque foreign bodies.  IMPRESSION: Status post 1st and 2nd toe amputations, with increasing periosteal reaction of  the distal 1st metatarsus and increasing osteopenia of the 2nd metatarsal head concerning for osteomyelitis.   Electronically Signed   By: Awilda Metro   On: 12/15/2012 00:41    CBC  Recent Labs Lab 12/14/12 2314 12/15/12 0524 12/16/12 1558 12/17/12 0452  WBC 8.4 7.0  --  8.1  HGB 11.1* 10.0* 11.2* 9.1*  HCT 35.5* 31.7* 33.0* 29.5*  PLT 257 247  --  236  MCV 86.2 84.1  --  85.3  MCH 26.9 26.5  --  26.3  MCHC 31.3 31.5  --  30.8  RDW  15.2 15.4  --  15.0  LYMPHSABS 1.0 1.2  --   --   MONOABS 1.0 1.1*  --   --   EOSABS 0.1 0.1  --   --   BASOSABS 0.0 0.1  --   --     Chemistries   Recent Labs Lab 12/14/12 2314 12/15/12 0524 12/16/12 1558  NA 137 136 137  K 3.7 3.7 4.1  CL 94* 94*  --   CO2 30 30  --   GLUCOSE 248* 304* 103*  BUN 22 27*  --   CREATININE 5.68* 6.47*  --   CALCIUM 8.8 8.2*  --   AST 11 10  --   ALT 9 7  --   ALKPHOS 85 69  --   BILITOT 0.3 0.3  --    ------------------------------------------------------------------------------------------------------------------ estimated creatinine clearance is 11.1 ml/min (by C-G formula based on Cr of 6.47). ------------------------------------------------------------------------------------------------------------------ No results found for this basename: HGBA1C,  in the last 72 hours ------------------------------------------------------------------------------------------------------------------ No results found for this basename: CHOL, HDL, LDLCALC, TRIG, CHOLHDL, LDLDIRECT,  in the last 72 hours ------------------------------------------------------------------------------------------------------------------  Recent Labs  12/15/12 0524  TSH 1.523   ------------------------------------------------------------------------------------------------------------------ No results found for this basename: VITAMINB12, FOLATE, FERRITIN, TIBC, IRON, RETICCTPCT,  in the last 72 hours  Coagulation profile No results found for this basename: INR, PROTIME,  in the last 168 hours  No results found for this basename: DDIMER,  in the last 72 hours  Cardiac Enzymes No results found for this basename: CK, CKMB, TROPONINI, MYOGLOBIN,  in the last 168 hours ------------------------------------------------------------------------------------------------------------------ No components found with this basename: POCBNP,

## 2012-12-18 ENCOUNTER — Encounter (HOSPITAL_COMMUNITY): Payer: Self-pay | Admitting: Vascular Surgery

## 2012-12-18 LAB — GLUCOSE, CAPILLARY
Glucose-Capillary: 153 mg/dL — ABNORMAL HIGH (ref 70–99)
Glucose-Capillary: 124 mg/dL — ABNORMAL HIGH (ref 70–99)

## 2012-12-18 LAB — HEPATITIS B SURFACE ANTIGEN: Hepatitis B Surface Ag: NEGATIVE

## 2012-12-18 LAB — CBC
Hemoglobin: 9.1 g/dL — ABNORMAL LOW (ref 13.0–17.0)
MCH: 26.8 pg (ref 26.0–34.0)
MCHC: 32 g/dL (ref 30.0–36.0)
MCV: 83.8 fL (ref 78.0–100.0)
Platelets: 213 10*3/uL (ref 150–400)
RBC: 3.39 MIL/uL — ABNORMAL LOW (ref 4.22–5.81)
WBC: 6.7 10*3/uL (ref 4.0–10.5)

## 2012-12-18 LAB — BASIC METABOLIC PANEL
CO2: 30 mEq/L (ref 19–32)
Calcium: 8.2 mg/dL — ABNORMAL LOW (ref 8.4–10.5)
Chloride: 100 mEq/L (ref 96–112)
GFR calc non Af Amer: 9 mL/min — ABNORMAL LOW (ref >90)
Glucose, Bld: 166 mg/dL — ABNORMAL HIGH (ref 70–99)
Sodium: 141 mEq/L (ref 135–145)

## 2012-12-18 MED ORDER — NEPRO/CARBSTEADY PO LIQD
237.0000 mL | ORAL | Status: DC | PRN
  Filled 2012-12-18: qty 237

## 2012-12-18 MED ORDER — SODIUM CHLORIDE 0.9 % IV SOLN: 100.0000 mL | INTRAVENOUS | Status: DC | PRN

## 2012-12-18 MED ORDER — LIDOCAINE-PRILOCAINE 2.5-2.5 % EX CREA: 1.0000 "application " | TOPICAL_CREAM | CUTANEOUS | Status: DC | PRN

## 2012-12-18 MED ORDER — LIDOCAINE HCL (PF) 1 % IJ SOLN: 5.0000 mL | INTRAMUSCULAR | Status: DC | PRN

## 2012-12-18 MED ORDER — HEPARIN SODIUM (PORCINE) 1000 UNIT/ML DIALYSIS: 1000.0000 [IU] | INTRAMUSCULAR | Status: DC | PRN

## 2012-12-18 MED ORDER — PENTAFLUOROPROP-TETRAFLUOROETH EX AERO: 1.0000 "application " | INHALATION_SPRAY | CUTANEOUS | Status: DC | PRN

## 2012-12-18 MED ORDER — ALTEPLASE 2 MG IJ SOLR
2.0000 mg | Freq: Once | INTRAMUSCULAR | Status: DC | PRN
  Filled 2012-12-18: qty 2

## 2012-12-18 MED ORDER — DOXERCALCIFEROL 4 MCG/2ML IV SOLN
INTRAVENOUS | Status: AC
  Administered 2012-12-18: 2 ug via INTRAVENOUS
  Filled 2012-12-18: qty 2

## 2012-12-18 NOTE — Progress Notes (Signed)
OT Cancellation Note  Patient Details Name: Jack [name] MRN: [id number] DOB: [birth year]   Cancelled Treatment:    Reason Eval/Treat Not Completed: Patient at procedure or test/ unavailable. Pt in hemodialysis.  Evette Georges [phone] 12/18/2012, 7:24 AM

## 2012-12-18 NOTE — Progress Notes (Signed)
Subjective:  On dialysis, lots of post op pain R foot, no other complaints  Objective: Vital signs in last 24 hours: Temp:  [97.5 F (36.4 C)-99.2 F (37.3 C)] 98.3 F (36.8 C) (12/05 0702) Pulse Rate:  [54-102] 66 (12/05 0818) Resp:  [15-20] 15 (12/05 0818) BP: (88-135)/(51-73) 96/59 mmHg (12/05 0818) SpO2:  [96 %-100 %] 96 % (12/05 0702) Weight:  [87.4 kg (192 lb 10.9 oz)-90.2 kg (198 lb 13.7 oz)] 87.4 kg (192 lb 10.9 oz) (12/05 0702) Weight change: 6.103 kg (13 lb 7.3 oz)  Intake/Output from previous day: 12/04 0701 - 12/05 0700 In: 876 [P.O.:750; I.V.:26; IV Piggyback:100] Out: 2752 [Drains:50; Stool:2] Intake/Output this shift:    Lab Results:  Recent Labs  12/17/12 0452 12/18/12 0540  WBC 8.1 6.7  HGB 9.1* 9.1*  HCT 29.5* 28.4*  PLT 236 213   BMET:   Recent Labs  12/16/12 1558 12/18/12 0540  NA 137 141  K 4.1 4.3  CL  --  100  CO2  --  30  GLUCOSE 103* 166*  BUN  --  23  CREATININE  --  6.00*  CALCIUM  --  8.2*   No results found for this basename: PTH,  in the last 72 hours Iron Studies: No results found for this basename: IRON, TIBC, TRANSFERRIN, FERRITIN,  in the last 72 hours  EXAM: Gen:  alert, in no apparent distress Resp: clear bilat Cardio:  RRR without murmur or rub GI:  + BS, soft and nontender Extremities:  R transmet amputation with wound vac, no LE or UE edema Access:  AVF @ LUA with + bruit  Dialysis orders: [facility]  4hrs   F180   400/800   2/2.25 [place]    88.5kg   Heparin 8700    LUA AVF  Hectorol 2ug    Epo 4200    Venofer none  Last labs: tsat 18% ferritin 541 phos 6 pth 422   Assessment/Plan: 1. Osteomyelitis R foot - s/p R transmetatarsal amputation per Dr. Darrick Penna yesterday, stable with wound vac. No abx needed for surgery notes by Dr Darrick Penna, will d/c vanc and zosyn.  2. ESRD - HD today 3. HTN/Volume - BP 90's and below dry wt, will d/c BP meds for now (nevibulol and doxazosin). No vol excess on exam 4. Anemia/CKD- getting  esa with darbe 60/wk, started IV iron series 5. Sec HPT - Ca 8.8; Hectorol 2 mcg, Phoslo 3 with meals. 6. Nutrition - renal diet and vitamin. 7. DM Type 2 - per primary. 8. Hx MGUS 9. Dispo- per primary and VVS   Vinson Moselle MD  pager [phone]    cell [phone]  12/18/2012, 8:59 AM

## 2012-12-18 NOTE — Procedures (Signed)
I was present at this dialysis session, have reviewed the session itself and made  appropriate changes   Vinson Moselle MD  pager [phone]    cell [phone]  12/18/2012, 8:37 AM

## 2012-12-18 NOTE — Evaluation (Signed)
Physical Therapy Evaluation Patient Details Name: Jack [name] MRN: [id number] DOB: [birth year] Today's Date: 12/18/2012 Time: 7829-5621 PT Time Calculation (min): 23 min  PT Assessment / Plan / Recommendation History of Present Illness  Pt is a 67 y/o male admitted s/p transmetatarsal amputation on the right, with wound vac.  Clinical Impression  This patient presents with acute pain and decreased functional independence following the above mentioned procedure. At the time of PT eval, pt required verbal and tactile cues to maintain NWB status. Pt had extreme difficulty keeping weight off the RLE. Therapist discussed the importance of following post-op precautions and keeping weight off of the RLE during functional mobility. W/C was delivered during session, and pt reports he has a walker at home.     PT Assessment  Patient needs continued PT services    Follow Up Recommendations  Home health PT    Does the patient have the potential to tolerate intense rehabilitation      Barriers to Discharge        Equipment Recommendations  Wheelchair (measurements PT);Wheelchair cushion (measurements PT); Bedside Commode 3-in-1    Recommendations for Other Services     Frequency Min 3X/week    Precautions / Restrictions Precautions Precautions: Fall Restrictions Weight Bearing Restrictions: Yes RLE Weight Bearing: Non weight bearing   Pertinent Vitals/Pain 7/10 after session      Mobility  Bed Mobility Bed Mobility: Supine to Sit;Sitting - Scoot to Edge of Bed Supine to Sit: 5: Supervision;HOB flat Sitting - Scoot to Edge of Bed: 5: Supervision Details for Bed Mobility Assistance: VC's for sequencing and technique to transition to EOB. Bed flat and no use of rails to simulate home environment. Transfers Transfers: Sit to Stand;Stand to Sit Sit to Stand: 4: Min assist;From bed;With upper extremity assist Stand to Sit: 4: Min assist;To chair/3-in-1;With upper extremity  assist Details for Transfer Assistance: VC's for hand placement on seated surface and to maintain NWB status on right. Pt had difficulty keeping weight off the RLE when attempting to stand. Ambulation/Gait Ambulation/Gait Assistance: 4: Min guard Ambulation Distance (Feet): 5 Feet Assistive device: Rolling walker Ambulation/Gait Assistance Details: VC's for sequencing and safety awareness with RW. Pt was unable to maintain NWB status on R, however did make a good effort to keep foot off floor.  Gait Pattern: Step-to pattern;Decreased stride length;Trunk flexed;Narrow base of support Gait velocity: Decreased    Exercises     PT Diagnosis: Difficulty walking;Acute pain  PT Problem List: Decreased strength;Decreased range of motion;Decreased activity tolerance;Decreased balance;Decreased mobility;Decreased knowledge of use of DME;Decreased safety awareness;Pain PT Treatment Interventions: DME instruction;Gait training;Stair training;Functional mobility training;Therapeutic activities;Therapeutic exercise;Neuromuscular re-education;Patient/family education     PT Goals(Current goals can be found in the care plan section) Acute Rehab PT Goals Patient Stated Goal: To return home with family PT Goal Formulation: With patient/family Time For Goal Achievement: 12/25/12 Potential to Achieve Goals: Fair  Visit Information  Last PT Received On: 12/18/12 Assistance Needed: +1 History of Present Illness: Pt is a 67 y/o male admitted s/p transmetatarsal amputation on the right, with wound vac.       Prior Functioning  Home Living Family/patient expects to be discharged to:: Private residence Living Arrangements: Children;Other relatives Available Help at Discharge: Family;Available 24 hours/day Type of Home: Apartment Home Access: Level entry Home Layout: One level Home Equipment: Walker - 2 wheels;Cane - single point;Shower seat Prior Function Level of Independence: Needs assistance Gait  / Transfers Assistance Needed: Walker ADL's / Homemaking Assistance Needed:  Assist with bathing and dressing Communication Communication: No difficulties Dominant Hand: Right    Cognition  Cognition Arousal/Alertness: Awake/alert Behavior During Therapy: WFL for tasks assessed/performed Overall Cognitive Status: Within Functional Limits for tasks assessed    Extremity/Trunk Assessment Upper Extremity Assessment Upper Extremity Assessment: Defer to OT evaluation Lower Extremity Assessment Lower Extremity Assessment: Generalized weakness Cervical / Trunk Assessment Cervical / Trunk Assessment: Normal   Balance Balance Balance Assessed: Yes Static Sitting Balance Static Sitting - Balance Support: Feet supported;Bilateral upper extremity supported Static Sitting - Level of Assistance: 5: Stand by assistance Static Standing Balance Static Standing - Balance Support: Bilateral upper extremity supported Static Standing - Level of Assistance: 4: Min assist  End of Session PT - End of Session Equipment Utilized During Treatment: Gait belt Activity Tolerance: Patient limited by fatigue Patient left: in chair;with call bell/phone within reach;with family/visitor present Nurse Communication: Mobility status  GP     Ruthann Cancer 12/18/2012, 2:10 PM  Ruthann Cancer, PT, DPT [phone]

## 2012-12-18 NOTE — Progress Notes (Signed)
Physician Discharge Summary  Jack [name] WUJ:[id number] DOB: [birth year] DOA: 12/14/2012  PCP: Geraldo Pitter, MD  Admit date: 12/14/2012 Discharge date: 12/18/2012  Time spent: 35 minutes  Recommendations for Outpatient Follow-up:  Patient should his primary care physician within one week of discharge. He will be discharged with home health as well as a wound VAC. Patient should also follow up with vascular surgery within 2-3 weeks of discharge.  Discharge Diagnoses:  Principal Problem:   Osteomyelitis of foot, acute Active Problems:   Hypothyroidism   End stage renal disease   Diabetes mellitus   Osteomyelitis of foot   Discharge Condition: Stable  Diet recommendation: Heart healthy, carb modified  Filed Weights   12/18/12 0500 12/18/12 0702 12/18/12 1127  Weight: 87.635 kg (193 lb 3.2 oz) 87.4 kg (192 lb 10.9 oz) 87.3 kg (192 lb 7.4 oz)    History of present illness:  Jack [name] is a 66 y.o. male with history of ESRD on hemodialysis who had right foot first and second toe amputations last month presents to the ER with complaints of weakness. Patient denies any chest pain shortness of breath nausea vomiting abdominal pain or diarrhea. In the ER patient was found to be febrile with temperatures around 108F. Exam patient's right foot has some active discharge and x-ray showed features concerning for osteomyelitis of the right foot. Patient has been started on empiric antibiotics after blood cultures and has been admitted for further management.   Hospital Course:  This 67 year old with a history of end-stage renal disease on hemodialysis that presented to the emergency department for complaints of weakness. Patient was noted to be febrile with temperature of 10 8F. Upon exam of his right foot during admission patient was noted to have active discharge with concerning osteomyelitis of the right foot on x-ray. Patient was admitted for osteomyelitis and was started on IV  antibiotics. Vascular surgery was consulted which performed a right transmetatarsal amputation. Patient also had a wound VAC in place. Patient will be discharged with the wound VAC and home health.  Nephrology was also consulted for patient's end-stage renal disease. He was continued on his dialysis. As for his other chronic medical problems including hypothyroidism patient's Synthroid, patient does have a history of mucous which remained stable, patient has anemia of chronic disease which remained stable as well. Patient also has a history of diabetes mellitus for which Tradjenta was continued along with insulin sliding scale. Diabetes educators also consulted for further management and education.  Again patient will be discharged home with home health and wound VAC. He should have his primary care physician one week of discharge as well as Dr. Darrick Penna, but her surgeon. This was discussed with the patient he does understand and agree.  Procedures: Right transmetatarsal amputation  Consultations: Nephrology Vascular surgery  Discharge Exam: Filed Vitals:   12/18/12 1127  BP: 128/66  Pulse: 62  Temp: 97.6 F (36.4 C)  Resp: 20    Exam  General: Well developed, well nourished, NAD, appears stated age  HEENT: NCAT, PERRLA, EOMI, Anicteic Sclera, mucous membranes moist. No pharyngeal erythema or exudates  Neck: Supple, no masses  Cardiovascular: S1 S2 auscultated, no rubs, murmurs or gallops. Regular rate and rhythm.  Respiratory: Clear to auscultation bilaterally with equal chest rise  Abdomen: Soft, nontender, nondistended, + bowel sounds  Extremities: warm dry without cyanosis clubbing or edema in the left lower extremity. Left upper extremity AV fistula in place, with good thrill and bruit. Right lower extremity  shows wound VAC in place  Neuro: AAOx3, cranial nerves grossly intact.  Skin: Without rashes exudates or nodules  Psych: Normal affect and demeanor with intact judgement and  insight  Discharge Instructions  Discharge Orders   Future Appointments Provider Department Dept Phone   03/23/2013 1:00 PM [facility]l-Dg 5 [facility] [phone]   03/30/2013 12:00 PM Artis Delay, MD [facility] [phone]   Future Orders Complete By Expires   Diet - low sodium heart healthy  As directed    Discharge instructions  As directed    Comments:     Patient should his primary care physician within one week of discharge. He will be discharged with home health as well as a wound VAC. Patient should also follow up with vascular surgery within 2-3 weeks of discharge.   Increase activity slowly  As directed        Medication List         aspirin EC 81 MG tablet  Take 81 mg by mouth daily.     b complex-vitamin c-folic acid 0.8 MG Tabs tablet  Take 1 tablet by mouth daily.     BYSTOLIC 5 MG tablet  Generic drug:  nebivolol  Take 5 mg by mouth daily.     calcium acetate 667 MG capsule  Commonly known as:  PHOSLO  Take 2,001 mg by mouth 3 (three) times daily with meals.     doxazosin 2 MG tablet  Commonly known as:  CARDURA  Take 2 mg by mouth at bedtime.     gabapentin 400 MG capsule  Commonly known as:  NEURONTIN  Take 400 mg by mouth at bedtime.     levothyroxine 175 MCG tablet  Commonly known as:  SYNTHROID, LEVOTHROID  Take 175 mcg by mouth every morning.     ONGLYZA 5 MG Tabs tablet  Generic drug:  saxagliptin HCl  Take 5 mg by mouth at bedtime.     oxyCODONE 5 MG immediate release tablet  Commonly known as:  Oxy IR/ROXICODONE  Take 1 tablet (5 mg total) by mouth every 6 (six) hours as needed for pain.       Allergies  Allergen Reactions  . Lisinopril Cough  . Ivp Dye [Iodinated Diagnostic Agents] Hives  . Morphine And Related Other (See Comments)    Bradycardia states patient       Follow-up Information   Follow up with Geraldo Pitter, MD. Schedule an appointment as soon as  possible for a visit in 1 week.   Specialty:  Family Medicine   Contact information:   [address] [place] [address] [phone]       Follow up with Sherren Kerns, MD. Schedule an appointment as soon as possible for a visit in 2 weeks.   Specialty:  Vascular Surgery   Contact information:   [address] [place] [address] [phone]        The results of significant diagnostics from this hospitalization (including imaging, microbiology, ancillary and laboratory) are listed below for reference.    Significant Diagnostic Studies: Dg Chest 2 View  12/15/2012   CLINICAL DATA:  Fever.  EXAM: CHEST  2 VIEW  COMPARISON:  Chest radiograph November 10, 2012  FINDINGS: Cardiomediastinal silhouette is nonsuspicious, mildly calcified aortic knob. Persistently elevated right hemidiaphragm with minimal right lung base strandy densities. The lungs are otherwise clear without pleural effusions or focal consolidations. Pulmonary vasculature is unremarkable. Trachea projects midline and  there is no pneumothorax. Soft tissue planes and included osseous structures are nonsuspicious.  IMPRESSION: Similar elevated right hemidiaphragm with minimal right lung base atelectasis.   Electronically Signed   By: Awilda Metro   On: 12/15/2012 00:37   Dg Foot Complete Right  12/15/2012   CLINICAL DATA:  Pain and drainage at close, history of diabetes, evaluate for osteomyelitis.  EXAM: RIGHT FOOT COMPLETE - 3+ VIEW  COMPARISON:  Right foot radiograph November 14, 2012  FINDINGS: Status post 1st and 2nd digit amputation at metacarpophalangeal joint space. Erosive changes of the distal 1st metatarsus is similar, with increasing mild periosteal reaction medially. Worsening focal osteopenia within the 2nd metatarsal head best appreciated on the oblique view.  No acute fracture deformity. No dislocation. Type 2 navicular bone. Dense vascular calcifications. Irregular soft tissue at the level of the  amputation with suspected ulceration, no subcutaneous emphysema nor radiopaque foreign bodies.  IMPRESSION: Status post 1st and 2nd toe amputations, with increasing periosteal reaction of the distal 1st metatarsus and increasing osteopenia of the 2nd metatarsal head concerning for osteomyelitis.   Electronically Signed   By: Awilda Metro   On: 12/15/2012 00:41    Microbiology: Recent Results (from the past 240 hour(s))  CULTURE, BLOOD (ROUTINE X 2)     Status: None   Collection Time    12/14/12 11:25 PM      Result Value Range Status   Specimen Description BLOOD HAND RIGHT   Final   Special Requests BOTTLES DRAWN AEROBIC ONLY 10CC   Final   Culture  Setup Time     Final   Value: 12/15/2012 04:33     Performed at [facility]   Culture     Final   Value:        BLOOD CULTURE RECEIVED NO GROWTH TO DATE CULTURE WILL BE HELD FOR 5 DAYS BEFORE ISSUING A FINAL NEGATIVE REPORT     Performed at [facility]   Report Status PENDING   Incomplete  CULTURE, BLOOD (ROUTINE X 2)     Status: None   Collection Time    12/14/12 11:31 PM      Result Value Range Status   Specimen Description BLOOD FOREARM RIGHT   Final   Special Requests BOTTLES DRAWN AEROBIC ONLY 5CC   Final   Culture  Setup Time     Final   Value: 12/15/2012 04:33     Performed at [facility]   Culture     Final   Value:        BLOOD CULTURE RECEIVED NO GROWTH TO DATE CULTURE WILL BE HELD FOR 5 DAYS BEFORE ISSUING A FINAL NEGATIVE REPORT     Performed at [facility]   Report Status PENDING   Incomplete  SURGICAL PCR SCREEN     Status: None   Collection Time    12/16/12  5:51 AM      Result Value Range Status   MRSA, PCR NEGATIVE  NEGATIVE Final   Staphylococcus aureus NEGATIVE  NEGATIVE Final   Comment:            The Xpert SA Assay (FDA     approved for NASAL specimens     in patients over 59 years of age),     is one component of     a comprehensive surveillance     program.  Test  performance has     been validated by [employer]  for patients greater     than or equal to 28 year old.     It is not intended     to diagnose infection nor to     guide or monitor treatment.     Labs: Basic Metabolic Panel:  Recent Labs Lab 12/14/12 2314 12/15/12 0524 12/16/12 1558 12/18/12 0540  NA 137 136 137 141  K 3.7 3.7 4.1 4.3  CL 94* 94*  --  100  CO2 30 30  --  30  GLUCOSE 248* 304* 103* 166*  BUN 22 27*  --  23  CREATININE 5.68* 6.47*  --  6.00*  CALCIUM 8.8 8.2*  --  8.2*   Liver Function Tests:  Recent Labs Lab 12/14/12 2314 12/15/12 0524  AST 11 10  ALT 9 7  ALKPHOS 85 69  BILITOT 0.3 0.3  PROT 7.7 6.9  ALBUMIN 3.3* 2.8*   No results found for this basename: LIPASE, AMYLASE,  in the last 168 hours No results found for this basename: AMMONIA,  in the last 168 hours CBC:  Recent Labs Lab 12/14/12 2314 12/15/12 0524 12/16/12 1558 12/17/12 0452 12/18/12 0540  WBC 8.4 7.0  --  8.1 6.7  NEUTROABS 6.4 4.5  --   --   --   HGB 11.1* 10.0* 11.2* 9.1* 9.1*  HCT 35.5* 31.7* 33.0* 29.5* 28.4*  MCV 86.2 84.1  --  85.3 83.8  PLT 257 247  --  236 213   Cardiac Enzymes: No results found for this basename: CKTOTAL, CKMB, CKMBINDEX, TROPONINI,  in the last 168 hours BNP: BNP (last 3 results)  Recent Labs  03/18/12 0940  PROBNP 12648.0*   CBG:  Recent Labs Lab 12/17/12 0624 12/17/12 1129 12/17/12 1932 12/17/12 2123 12/18/12 0632  GLUCAP 169* 171* 140* 155* 153*       Signed:  Shawnta Schlegel  [facility] 12/18/2012, 11:49 AM

## 2012-12-18 NOTE — Progress Notes (Signed)
Wound VAC dressing changed at 1330 on 12/18/12 and applied VAC for home use. VAC set to continuous suction at 75.

## 2012-12-18 NOTE — Progress Notes (Signed)
   VASCULAR PROGRESS NOTE  SUBJECTIVE: Pain adequately controlled.   PHYSICAL EXAM: Filed Vitals:   12/18/12 0731 12/18/12 0744 12/18/12 0758 12/18/12 0818  BP: 121/63 88/62 99/56  96/59  Pulse: 60 57 55 66  Temp:      TempSrc:      Resp: 20 18 18 15   Height:      Weight:      SpO2:       VAC on right TMA has a good seal.   LABS: Lab Results  Component Value Date   WBC 6.7 12/18/2012   HGB 9.1* 12/18/2012   HCT 28.4* 12/18/2012   MCV 83.8 12/18/2012   PLT 213 12/18/2012   Lab Results  Component Value Date   CREATININE 6.00* 12/18/2012   Lab Results  Component Value Date   INR 1.01 11/10/2012   PROTIME 16.8* 05/20/2012   CBG (last 3)   Recent Labs  12/17/12 1932 12/17/12 2123 12/18/12 0632  GLUCAP 140* 155* 153*    Principal Problem:   Osteomyelitis of foot, acute Active Problems:   Hypothyroidism   End stage renal disease   Diabetes mellitus   Osteomyelitis of foot  ASSESSMENT AND PLAN:  * 2 Days Post-Op s/p: Right TMA  * From our standpoint, he can go home when [facility] approved.   * Case manager has evaluated pt for equipment needs.   Cari Caraway Beeper: [phone] 12/18/2012

## 2012-12-19 NOTE — Discharge Summary (Signed)
Physician Discharge Summary  Jack [name] MRN:[id number] DOB: 05/04/1945 DOA: 12/14/2012  PCP: BLAND,VEITA J, MD  Admit date: 12/14/2012 Discharge date: 12/18/2012  Time spent: 35 minutes  Recommendations for Outpatient Follow-up:  Patient should his primary care physician within one week of discharge. He will be discharged with home health as well as a wound VAC. Patient should also follow up with vascular surgery within 2-3 weeks of discharge.  Discharge Diagnoses:  Principal Problem:   Osteomyelitis of foot, acute Active Problems:   Hypothyroidism   End stage renal disease   Diabetes mellitus   Osteomyelitis of foot   Discharge Condition: Stable  Diet recommendation: Heart healthy, carb modified  Filed Weights   12/18/12 0500 12/18/12 0702 12/18/12 1127  Weight: 87.635 kg (193 lb 3.2 oz) 87.4 kg (192 lb 10.9 oz) 87.3 kg (192 lb 7.4 oz)    History of present illness:  Jack [name] is a 67 y.o. male with history of ESRD on hemodialysis who had right foot first and second toe amputations last month presents to the ER with complaints of weakness. Patient denies any chest pain shortness of breath nausea vomiting abdominal pain or diarrhea. In the ER patient was found to be febrile with temperatures around 103F. Exam patient's right foot has some active discharge and x-ray showed features concerning for osteomyelitis of the right foot. Patient has been started on empiric antibiotics after blood cultures and has been admitted for further management.   Hospital Course:  This 67-year-old with a history of end-stage renal disease on hemodialysis that presented to the emergency department for complaints of weakness. Patient was noted to be febrile with temperature of 10 3F. Upon exam of his right foot during admission patient was noted to have active discharge with concerning osteomyelitis of the right foot on x-ray. Patient was admitted for osteomyelitis and was started on IV  antibiotics. Vascular surgery was consulted which performed a right transmetatarsal amputation. Patient also had a wound VAC in place. Patient will be discharged with the wound VAC and home health.  Nephrology was also consulted for patient's end-stage renal disease. He was continued on his dialysis. As for his other chronic medical problems including hypothyroidism patient's Synthroid, patient does have a history of mucous which remained stable, patient has anemia of chronic disease which remained stable as well. Patient also has a history of diabetes mellitus for which Tradjenta was continued along with insulin sliding scale. Diabetes educators also consulted for further management and education.  Again patient will be discharged home with home health and wound VAC. He should have his primary care physician one week of discharge as well as Dr. fields, but her surgeon. This was discussed with the patient he does understand and agree.  Procedures: Right transmetatarsal amputation  Consultations: Nephrology Vascular surgery  Discharge Exam: Filed Vitals:   12/18/12 1127  BP: 128/66  Pulse: 62  Temp: 97.6 F (36.4 C)  Resp: 20    Exam  General: Well developed, well nourished, NAD, appears stated age  HEENT: NCAT, PERRLA, EOMI, Anicteic Sclera, mucous membranes moist. No pharyngeal erythema or exudates  Neck: Supple, no masses  Cardiovascular: S1 S2 auscultated, no rubs, murmurs or gallops. Regular rate and rhythm.  Respiratory: Clear to auscultation bilaterally with equal chest rise  Abdomen: Soft, nontender, nondistended, + bowel sounds  Extremities: warm dry without cyanosis clubbing or edema in the left lower extremity. Left upper extremity AV fistula in place, with good thrill and bruit. Right lower extremity   shows wound VAC in place  Neuro: AAOx3, cranial nerves grossly intact.  Skin: Without rashes exudates or nodules  Psych: Normal affect and demeanor with intact judgement and  insight  Discharge Instructions  Discharge Orders   Future Appointments Provider Department Dept Phone   03/23/2013 1:00 PM Wl-Dg 5 [facility] [phone]   03/30/2013 12:00 PM Ni Gorsuch, MD [facility] [phone]   Future Orders Complete By Expires   Diet - low sodium heart healthy  As directed    Discharge instructions  As directed    Comments:     Patient should his primary care physician within one week of discharge. He will be discharged with home health as well as a wound VAC. Patient should also follow up with vascular surgery within 2-3 weeks of discharge.   Increase activity slowly  As directed        Medication List         aspirin EC 81 MG tablet  Take 81 mg by mouth daily.     b complex-vitamin c-folic acid 0.8 MG Tabs tablet  Take 1 tablet by mouth daily.     BYSTOLIC 5 MG tablet  Generic drug:  nebivolol  Take 5 mg by mouth daily.     calcium acetate 667 MG capsule  Commonly known as:  PHOSLO  Take 2,001 mg by mouth 3 (three) times daily with meals.     doxazosin 2 MG tablet  Commonly known as:  CARDURA  Take 2 mg by mouth at bedtime.     gabapentin 400 MG capsule  Commonly known as:  NEURONTIN  Take 400 mg by mouth at bedtime.     levothyroxine 175 MCG tablet  Commonly known as:  SYNTHROID, LEVOTHROID  Take 175 mcg by mouth every morning.     ONGLYZA 5 MG Tabs tablet  Generic drug:  saxagliptin HCl  Take 5 mg by mouth at bedtime.     oxyCODONE 5 MG immediate release tablet  Commonly known as:  Oxy IR/ROXICODONE  Take 1 tablet (5 mg total) by mouth every 6 (six) hours as needed for pain.       Allergies  Allergen Reactions  . Lisinopril Cough  . Ivp Dye [Iodinated Diagnostic Agents] Hives  . Morphine And Related Other (See Comments)    Bradycardia states patient       Follow-up Information   Follow up with BLAND,VEITA J, MD. Schedule an appointment as soon as  possible for a visit in 1 week.   Specialty:  Family Medicine   Contact information:   [address] [place] [address] [phone]       Follow up with FIELDS,CHARLES E, MD. Schedule an appointment as soon as possible for a visit in 2 weeks.   Specialty:  Vascular Surgery   Contact information:   [address] [place] [address] [phone]        The results of significant diagnostics from this hospitalization (including imaging, microbiology, ancillary and laboratory) are listed below for reference.    Significant Diagnostic Studies: Dg Chest 2 View  12/15/2012   CLINICAL DATA:  Fever.  EXAM: CHEST  2 VIEW  COMPARISON:  Chest radiograph November 10, 2012  FINDINGS: Cardiomediastinal silhouette is nonsuspicious, mildly calcified aortic knob. Persistently elevated right hemidiaphragm with minimal right lung base strandy densities. The lungs are otherwise clear without pleural effusions or focal consolidations. Pulmonary vasculature is unremarkable. Trachea projects midline and   there is no pneumothorax. Soft tissue planes and included osseous structures are nonsuspicious.  IMPRESSION: Similar elevated right hemidiaphragm with minimal right lung base atelectasis.   Electronically Signed   By: Courtnay  Bloomer   On: 12/15/2012 00:37   Dg Foot Complete Right  12/15/2012   CLINICAL DATA:  Pain and drainage at close, history of diabetes, evaluate for osteomyelitis.  EXAM: RIGHT FOOT COMPLETE - 3+ VIEW  COMPARISON:  Right foot radiograph November 14, 2012  FINDINGS: Status post 1st and 2nd digit amputation at metacarpophalangeal joint space. Erosive changes of the distal 1st metatarsus is similar, with increasing mild periosteal reaction medially. Worsening focal osteopenia within the 2nd metatarsal head best appreciated on the oblique view.  No acute fracture deformity. No dislocation. Type 2 navicular bone. Dense vascular calcifications. Irregular soft tissue at the level of the  amputation with suspected ulceration, no subcutaneous emphysema nor radiopaque foreign bodies.  IMPRESSION: Status post 1st and 2nd toe amputations, with increasing periosteal reaction of the distal 1st metatarsus and increasing osteopenia of the 2nd metatarsal head concerning for osteomyelitis.   Electronically Signed   By: Courtnay  Bloomer   On: 12/15/2012 00:41    Microbiology: Recent Results (from the past 240 hour(s))  CULTURE, BLOOD (ROUTINE X 2)     Status: None   Collection Time    12/14/12 11:25 PM      Result Value Range Status   Specimen Description BLOOD HAND RIGHT   Final   Special Requests BOTTLES DRAWN AEROBIC ONLY 10CC   Final   Culture  Setup Time     Final   Value: 12/15/2012 04:33     Performed at [facility]   Culture     Final   Value:        BLOOD CULTURE RECEIVED NO GROWTH TO DATE CULTURE WILL BE HELD FOR 5 DAYS BEFORE ISSUING A FINAL NEGATIVE REPORT     Performed at [facility]   Report Status PENDING   Incomplete  CULTURE, BLOOD (ROUTINE X 2)     Status: None   Collection Time    12/14/12 11:31 PM      Result Value Range Status   Specimen Description BLOOD FOREARM RIGHT   Final   Special Requests BOTTLES DRAWN AEROBIC ONLY 5CC   Final   Culture  Setup Time     Final   Value: 12/15/2012 04:33     Performed at [facility]   Culture     Final   Value:        BLOOD CULTURE RECEIVED NO GROWTH TO DATE CULTURE WILL BE HELD FOR 5 DAYS BEFORE ISSUING A FINAL NEGATIVE REPORT     Performed at [facility]   Report Status PENDING   Incomplete  SURGICAL PCR SCREEN     Status: None   Collection Time    12/16/12  5:51 AM      Result Value Range Status   MRSA, PCR NEGATIVE  NEGATIVE Final   Staphylococcus aureus NEGATIVE  NEGATIVE Final   Comment:            The Xpert SA Assay (FDA     approved for NASAL specimens     in patients over 21 years of age),     is one component of     a comprehensive surveillance     program.  Test  performance has     been validated by [facility]   for patients greater     than or equal to 1 year old.     It is not intended     to diagnose infection nor to     guide or monitor treatment.     Labs: Basic Metabolic Panel:  Recent Labs Lab 12/14/12 2314 12/15/12 0524 12/16/12 1558 12/18/12 0540  NA 137 136 137 141  K 3.7 3.7 4.1 4.3  CL 94* 94*  --  100  CO2 30 30  --  30  GLUCOSE 248* 304* 103* 166*  BUN 22 27*  --  23  CREATININE 5.68* 6.47*  --  6.00*  CALCIUM 8.8 8.2*  --  8.2*   Liver Function Tests:  Recent Labs Lab 12/14/12 2314 12/15/12 0524  AST 11 10  ALT 9 7  ALKPHOS 85 69  BILITOT 0.3 0.3  PROT 7.7 6.9  ALBUMIN 3.3* 2.8*   No results found for this basename: LIPASE, AMYLASE,  in the last 168 hours No results found for this basename: AMMONIA,  in the last 168 hours CBC:  Recent Labs Lab 12/14/12 2314 12/15/12 0524 12/16/12 1558 12/17/12 0452 12/18/12 0540  WBC 8.4 7.0  --  8.1 6.7  NEUTROABS 6.4 4.5  --   --   --   HGB 11.1* 10.0* 11.2* 9.1* 9.1*  HCT 35.5* 31.7* 33.0* 29.5* 28.4*  MCV 86.2 84.1  --  85.3 83.8  PLT 257 247  --  236 213   Cardiac Enzymes: No results found for this basename: CKTOTAL, CKMB, CKMBINDEX, TROPONINI,  in the last 168 hours BNP: BNP (last 3 results)  Recent Labs  03/18/12 0940  PROBNP 12648.0*   CBG:  Recent Labs Lab 12/17/12 0624 12/17/12 1129 12/17/12 1932 12/17/12 2123 12/18/12 0632  GLUCAP 169* 171* 140* 155* 153*       Signed:  Selah Klang  [facility] 12/18/2012, 11:49 AM   

## 2012-12-21 LAB — CULTURE, BLOOD (ROUTINE X 2)
Culture: NO GROWTH
Culture: NO GROWTH

## 2012-12-30 ENCOUNTER — Encounter: Payer: Self-pay | Admitting: Vascular Surgery

## 2012-12-31 ENCOUNTER — Encounter: Payer: Self-pay | Admitting: Vascular Surgery

## 2012-12-31 ENCOUNTER — Ambulatory Visit (INDEPENDENT_AMBULATORY_CARE_PROVIDER_SITE_OTHER): Payer: Medicare Other | Admitting: Vascular Surgery

## 2012-12-31 VITALS — BP 101/57 | HR 67 | Resp 18 | Ht 70.0 in | Wt 198.0 lb

## 2012-12-31 DIAGNOSIS — I739 Peripheral vascular disease, unspecified: Secondary | ICD-10-CM

## 2012-12-31 MED ORDER — OXYCODONE HCL 5 MG PO TABS: 5.0000 mg | ORAL_TABLET | Freq: Four times a day (QID) | ORAL | Status: DC | PRN

## 2012-12-31 NOTE — Progress Notes (Signed)
Patient is a 67 year old male who underwent transmetatarsal amputation of the right foot December 3. He returns today for followup. He has some occasional pain where the first toe was. He states there is serous sinus drainage and the VAC canister. He denies fever or chills.  Review of systems: He denies shortness of breath. He denies chest pain.  Physical exam:  Filed Vitals:   12/31/12 1411  BP: 101/57  Pulse: 67  Resp: 18  Height: 5\' 10"  (1.778 m)  Weight: 198 lb (89.812 kg)    Right lower extremity: Open wound distal right foot approximately 50% has some granulation tissue 50% does not no fluctuance no obvious sinus tracks or abscess cavity  Assessment: Marginal healing right transmetatarsal amputation.  Plan: Discussed with patient today that the wound may not heal. We'll continue VAC therapy for now and hopes to encourage granulation tissue formation. He will followup with me in 3-4 weeks. Her prescription was given today for oxycodone #20 dispensed  Fabienne Bruns, MD [facility] [place] Office: [phone] Pager: [phone]

## 2013-01-05 ENCOUNTER — Encounter: Payer: Self-pay | Admitting: *Deleted

## 2013-01-08 ENCOUNTER — Encounter (HOSPITAL_COMMUNITY): Payer: Self-pay | Admitting: Emergency Medicine

## 2013-01-08 ENCOUNTER — Telehealth: Payer: Self-pay

## 2013-01-08 DIAGNOSIS — E1169 Type 2 diabetes mellitus with other specified complication: Secondary | ICD-10-CM | POA: Insufficient documentation

## 2013-01-08 DIAGNOSIS — Z8701 Personal history of pneumonia (recurrent): Secondary | ICD-10-CM | POA: Insufficient documentation

## 2013-01-08 DIAGNOSIS — I120 Hypertensive chronic kidney disease with stage 5 chronic kidney disease or end stage renal disease: Secondary | ICD-10-CM | POA: Insufficient documentation

## 2013-01-08 DIAGNOSIS — L02619 Cutaneous abscess of unspecified foot: Secondary | ICD-10-CM | POA: Insufficient documentation

## 2013-01-08 DIAGNOSIS — R011 Cardiac murmur, unspecified: Secondary | ICD-10-CM | POA: Insufficient documentation

## 2013-01-08 DIAGNOSIS — C9000 Multiple myeloma not having achieved remission: Secondary | ICD-10-CM | POA: Insufficient documentation

## 2013-01-08 DIAGNOSIS — Z79899 Other long term (current) drug therapy: Secondary | ICD-10-CM | POA: Insufficient documentation

## 2013-01-08 DIAGNOSIS — N2581 Secondary hyperparathyroidism of renal origin: Secondary | ICD-10-CM | POA: Insufficient documentation

## 2013-01-08 DIAGNOSIS — Z7982 Long term (current) use of aspirin: Secondary | ICD-10-CM | POA: Insufficient documentation

## 2013-01-08 DIAGNOSIS — Z992 Dependence on renal dialysis: Secondary | ICD-10-CM | POA: Insufficient documentation

## 2013-01-08 DIAGNOSIS — Z885 Allergy status to narcotic agent status: Secondary | ICD-10-CM | POA: Insufficient documentation

## 2013-01-08 DIAGNOSIS — Z888 Allergy status to other drugs, medicaments and biological substances status: Secondary | ICD-10-CM | POA: Insufficient documentation

## 2013-01-08 DIAGNOSIS — H409 Unspecified glaucoma: Secondary | ICD-10-CM | POA: Insufficient documentation

## 2013-01-08 DIAGNOSIS — N186 End stage renal disease: Secondary | ICD-10-CM | POA: Insufficient documentation

## 2013-01-08 DIAGNOSIS — Z87891 Personal history of nicotine dependence: Secondary | ICD-10-CM | POA: Insufficient documentation

## 2013-01-08 DIAGNOSIS — J45909 Unspecified asthma, uncomplicated: Secondary | ICD-10-CM | POA: Insufficient documentation

## 2013-01-08 LAB — CBC WITH DIFFERENTIAL/PLATELET
Basophils Absolute: 0 10*3/uL (ref 0.0–0.1)
Basophils Relative: 0 % (ref 0–1)
Eosinophils Absolute: 0.1 10*3/uL (ref 0.0–0.7)
Eosinophils Relative: 2 % (ref 0–5)
HCT: 32.6 % — ABNORMAL LOW (ref 39.0–52.0)
MCH: 26.1 pg (ref 26.0–34.0)
MCHC: 30.1 g/dL (ref 30.0–36.0)
MCV: 86.7 fL (ref 78.0–100.0)
Monocytes Absolute: 0.6 10*3/uL (ref 0.1–1.0)
Neutro Abs: 5.3 10*3/uL (ref 1.7–7.7)
Platelets: 356 10*3/uL (ref 150–400)
RDW: 16.2 % — ABNORMAL HIGH (ref 11.5–15.5)

## 2013-01-08 LAB — COMPREHENSIVE METABOLIC PANEL
AST: 11 U/L (ref 0–37)
Albumin: 3.2 g/dL — ABNORMAL LOW (ref 3.5–5.2)
Calcium: 9.4 mg/dL (ref 8.4–10.5)
Chloride: 93 mEq/L — ABNORMAL LOW (ref 96–112)
Creatinine, Ser: 5.24 mg/dL — ABNORMAL HIGH (ref 0.50–1.35)
GFR calc Af Amer: 12 mL/min — ABNORMAL LOW (ref >90)
GFR calc non Af Amer: 10 mL/min — ABNORMAL LOW (ref >90)
Glucose, Bld: 214 mg/dL — ABNORMAL HIGH (ref 70–99)
Potassium: 3.6 mEq/L (ref 3.5–5.1)
Sodium: 137 mEq/L (ref 135–145)
Total Protein: 7.9 g/dL (ref 6.0–8.3)

## 2013-01-08 NOTE — Telephone Encounter (Signed)
Phone call from pt. to report that his [facility] RN advised him to call  and report "odor" from right foot wound, and of "dead skin" at wound site.  Was advised to call for an appt.  Denies fever or chills. Continues to have Wound Vac in place with [facility] RN changing 3 x/ wk.   Reports has intermittent pain at surgical site, but no increased pain.  Last evaluated on 12/31/12 and noted to have "marginal healing" @ right TMA site.  Will discuss with Dr. Myra Gianotti on 01/11/13 re: appt..  Recommended pt. go to the ER if fever/ chills, or worsening symptoms occur over the weekend.  Verb. Understanding.

## 2013-01-08 NOTE — ED Notes (Signed)
Pt. reports right foot pain with drainage / odor/ swelling  for several days , pt.'s foot has a wound vac that is being managed by home health nurse . Denies fever or chills.

## 2013-01-09 ENCOUNTER — Emergency Department (HOSPITAL_COMMUNITY)
Admission: EM | Admit: 2013-01-09 | Discharge: 2013-01-09 | Disposition: A | Discharge status: Home / Self Care | Payer: Medicare Other | Attending: Emergency Medicine | Admitting: Emergency Medicine

## 2013-01-09 DIAGNOSIS — L03119 Cellulitis of unspecified part of limb: Secondary | ICD-10-CM

## 2013-01-09 DIAGNOSIS — N186 End stage renal disease: Secondary | ICD-10-CM

## 2013-01-09 DIAGNOSIS — E119 Type 2 diabetes mellitus without complications: Secondary | ICD-10-CM

## 2013-01-09 MED ORDER — OXYCODONE-ACETAMINOPHEN 5-325 MG PO TABS: 1.0000 | ORAL_TABLET | ORAL | Status: DC | PRN

## 2013-01-09 MED ORDER — OXYCODONE-ACETAMINOPHEN 5-325 MG PO TABS
2.0000 | ORAL_TABLET | Freq: Once | ORAL | Status: AC
  Administered 2013-01-09: 2 via ORAL
  Filled 2013-01-09: qty 2

## 2013-01-11 ENCOUNTER — Telehealth: Payer: Self-pay

## 2013-01-11 ENCOUNTER — Encounter: Payer: Self-pay | Admitting: Family

## 2013-01-11 ENCOUNTER — Ambulatory Visit (INDEPENDENT_AMBULATORY_CARE_PROVIDER_SITE_OTHER): Payer: Medicare Other | Admitting: Family

## 2013-01-11 VITALS — BP 105/63 | HR 51 | Temp 98.7°F | Resp 14 | Ht 70.0 in | Wt 200.0 lb

## 2013-01-11 DIAGNOSIS — L089 Local infection of the skin and subcutaneous tissue, unspecified: Secondary | ICD-10-CM

## 2013-01-11 DIAGNOSIS — M79609 Pain in unspecified limb: Secondary | ICD-10-CM

## 2013-01-11 DIAGNOSIS — I739 Peripheral vascular disease, unspecified: Secondary | ICD-10-CM

## 2013-01-11 DIAGNOSIS — Z48812 Encounter for surgical aftercare following surgery on the circulatory system: Secondary | ICD-10-CM

## 2013-01-11 MED ORDER — OXYCODONE HCL 5 MG PO TABS: 5.0000 mg | ORAL_TABLET | Freq: Four times a day (QID) | ORAL | Status: DC | PRN

## 2013-01-11 MED ORDER — LEVOFLOXACIN 500 MG PO TABS: 500.0000 mg | ORAL_TABLET | Freq: Every day | ORAL | Status: DC

## 2013-01-11 NOTE — Patient Instructions (Signed)
Peripheral Vascular Disease Peripheral Vascular Disease (PVD), also called Peripheral Arterial Disease (PAD), is a circulation problem caused by cholesterol (atherosclerotic plaque) deposits in the arteries. PVD commonly occurs in the lower extremities (legs) but it can occur in other areas of the body, such as your arms. The cholesterol buildup in the arteries reduces blood flow which can cause pain and other serious problems. The presence of PVD can place a person at risk for Coronary Artery Disease (CAD).  CAUSES  Causes of PVD can be many. It is usually associated with more than one risk factor such as:   High Cholesterol.  Smoking.  Diabetes.  Lack of exercise or inactivity.  High blood pressure (hypertension).  Obesity.  Family history. SYMPTOMS   When the lower extremities are affected, patients with PVD may experience:  Leg pain with exertion or physical activity. This is called INTERMITTENT CLAUDICATION. This may present as cramping or numbness with physical activity. The location of the pain is associated with the level of blockage. For example, blockage at the abdominal level (distal abdominal aorta) may result in buttock or hip pain. Lower leg arterial blockage may result in calf pain.  As PVD becomes more severe, pain can develop with less physical activity.  In people with severe PVD, leg pain may occur at rest.  Other PVD signs and symptoms:  Leg numbness or weakness.  Coldness in the affected leg or foot, especially when compared to the other leg.  A change in leg color.  Patients with significant PVD are more prone to ulcers or sores on toes, feet or legs. These may take longer to heal or may reoccur. The ulcers or sores can become infected.  If signs and symptoms of PVD are ignored, gangrene may occur. This can result in the loss of toes or loss of an entire limb.  Not all leg pain is related to PVD. Other medical conditions can cause leg pain such  as:  Blood clots (embolism) or Deep Vein Thrombosis.  Inflammation of the blood vessels (vasculitis).  Spinal stenosis. DIAGNOSIS  Diagnosis of PVD can involve several different types of tests. These can include:  Pulse Volume Recording Method (PVR). This test is simple, painless and does not involve the use of X-rays. PVR involves measuring and comparing the blood pressure in the arms and legs. An ABI (Ankle-Brachial Index) is calculated. The normal ratio of blood pressures is 1. As this number becomes smaller, it indicates more severe disease.  < 0.95  indicates significant narrowing in one or more leg vessels.  <0.8 there will usually be pain in the foot, leg or buttock with exercise.  <0.4 will usually have pain in the legs at rest.  <0.25  usually indicates limb threatening PVD.  Doppler detection of pulses in the legs. This test is painless and checks to see if you have a pulses in your legs/feet.  A dye or contrast material (a substance that highlights the blood vessels so they show up on x-ray) may be given to help your caregiver better see the arteries for the following tests. The dye is eliminated from your body by the kidney's. Your caregiver may order blood work to check your kidney function and other laboratory values before the following tests are performed:  Magnetic Resonance Angiography (MRA). An MRA is a picture study of the blood vessels and arteries. The MRA machine uses a large magnet to produce images of the blood vessels.  Computed Tomography Angiography (CTA). A CTA is a   specialized x-ray that looks at how the blood flows in your blood vessels. An IV may be inserted into your arm so contrast dye can be injected.  Angiogram. Is a procedure that uses x-rays to look at your blood vessels. This procedure is minimally invasive, meaning a small incision (cut) is made in your groin. A small tube (catheter) is then inserted into the artery of your groin. The catheter is  guided to the blood vessel or artery your caregiver wants to examine. Contrast dye is injected into the catheter. X-rays are then taken of the blood vessel or artery. After the images are obtained, the catheter is taken out. TREATMENT  Treatment of PVD involves many interventions which may include:  Lifestyle changes:  Quitting smoking.  Exercise.  Following a low fat, low cholesterol diet.  Control of diabetes.  Foot care is very important to the PVD patient. Good foot care can help prevent infection.  Medication:  Cholesterol-lowering medicine.  Blood pressure medicine.  Anti-platelet drugs.  Certain medicines may reduce symptoms of Intermittent Claudication.  Interventional/Surgical options:  Angioplasty. An Angioplasty is a procedure that inflates a balloon in the blocked artery. This opens the blocked artery to improve blood flow.  Stent Implant. A wire mesh tube (stent) is placed in the artery. The stent expands and stays in place, allowing the artery to remain open.  Peripheral Bypass Surgery. This is a surgical procedure that reroutes the blood around a blocked artery to help improve blood flow. This type of procedure may be performed if Angioplasty or stent implants are not an option. SEEK IMMEDIATE MEDICAL CARE IF:   You develop pain or numbness in your arms or legs.  Your arm or leg turns cold, becomes blue in color.  You develop redness, warmth, swelling and pain in your arms or legs. MAKE SURE YOU:   Understand these instructions.  Will watch your condition.  Will get help right away if you are not doing well or get worse. Document Released: 02/08/2004 Document Revised: 03/25/2011 Document Reviewed: 01/05/2008 Exit[employer] Patient Information 2014 [employer].  

## 2013-01-11 NOTE — Telephone Encounter (Signed)
Called pt. to follow-up on symptoms following Triage call of 12/26.  Pt. Stated he went to the [facility] ER on 12/26 and was there x 24 hrs.  Reports that they told me "they couldn't remove the wound vac and clean the wound, because they did not have anyone that could put it back on."  Reports that the [facility] RN checked him on Saturday, and again advised him to see the MD due to strong odor, and possible infection.  Discussed with Dr. Myra Gianotti;  Advised to bring pt. into office today, and be eval. by the nurse practitioner.

## 2013-01-11 NOTE — Progress Notes (Signed)
[facility] [place] HISTORY AND PHYSICAL -PAD  History of Present Illness Jack [name] is a 67 y.o. male patient of Dr. Darrick Penna who underwent transmetatarsal amputation of the right foot December 16, 2012. He returns today for strong odor from wound vac site that started about 2 weeks ago. He denies fever or chills. Seen in [facility] ED on 12/26 for this, states he was given IV Vancomycin while there, was there for 12 hours, is not taking po antibiotic now. He walks with crutches, walker, and also uses w/c at home. He reports that the wound vac was draining a large amount of dark blood, wound vac dressing was removed in this office today. He reports about the same amount of pain in his right foot since the amputation on 12/16/12. He is on HD since March this year, his first and only permanent HD access is in his right upper arm. Has had DM for about 30 years, denies smoking. He notes dry small wound at base of cuticle of left great toe for the last 2 days. He denies any other new medical problems or surgeries.   Past Medical History  Diagnosis Date  . Hypertension   . Diabetes mellitus without complication   . Shortness of breath   . Pneumonia     2012  . Heart murmur   . Glaucoma   . Multiple myeloma, without mention of having achieved remission(203.00) 03/30/2012    Cytogenetic neg on 03/23/2012.  . End stage renal disease on dialysis   . Asthma   . Hyperparathyroidism, secondary renal     Social History History  Substance Use Topics  . Smoking status: Former Smoker    Types: Cigarettes    Quit date: 03/19/1982  . Smokeless tobacco: Never Used  . Alcohol Use: No     Comment: formerly    Family History Family History  Problem Relation Age of Onset  . Diabetes Mother   . Cancer Mother     bone   . Kidney disease Father     Past Surgical History  Procedure Laterality Date  . Thyroidectomy    . Cervical disc surgery    . Eye surgery     CATARACTS  . Insertion of dialysis catheter Right 03/19/2012    Procedure: INSERTION OF DIALYSIS CATHETER;  Surgeon: Pryor Ochoa, MD;  Location: [facility] OR;  Service: Vascular;  Laterality: Right;  Right Internal Jugular  . Av fistula placement Left 03/25/2012    Procedure: ARTERIOVENOUS (AV) FISTULA CREATION;  Surgeon: Pryor Ochoa, MD;  Location: [facility] OR;  Service: Vascular;  Laterality: Left;  . Ligation of competing branches of arteriovenous fistula Left 05/08/2012    Procedure: LIGATION OF COMPETING BRANCHES OF ARTERIOVENOUS FISTULA;  Surgeon: Pryor Ochoa, MD;  Location: [facility] OR;  Service: Vascular;  Laterality: Left;  Ultrasound guided  . Cardiac catheterization      approx 30 years ago  . Amputation Right 11/10/2012    Procedure: AMPUTATION FIRST and SECOND TOES Right Foot;  Surgeon: Sherren Kerns, MD;  Location: [facility] OR;  Service: Vascular;  Laterality: Right;  . Transmetatarsal amputation Left 12/16/2012    Procedure: TRANSMETATARSAL AMPUTATION AND VAC PLACEMENT;  Surgeon: Sherren Kerns, MD;  Location: [facility] OR;  Service: Vascular;  Laterality: Left;    Allergies  Allergen Reactions  . Lisinopril Cough  . Ivp Dye [Iodinated Diagnostic Agents] Hives  . Morphine And Related Other (See Comments)    Bradycardia states patient  Current Outpatient Prescriptions  Medication Sig Dispense Refill  . aspirin EC 81 MG tablet Take 81 mg by mouth daily.      Marland Kitchen b complex-vitamin c-folic acid (NEPHRO-VITE) 0.8 MG TABS tablet Take 1 tablet by mouth daily.      Marland Kitchen BYSTOLIC 5 MG tablet Take 5 mg by mouth daily.      . calcium acetate (PHOSLO) 667 MG capsule Take 2,001 mg by mouth 3 (three) times daily with meals.      . doxazosin (CARDURA) 2 MG tablet Take 2 mg by mouth at bedtime.      . gabapentin (NEURONTIN) 400 MG capsule Take 400 mg by mouth at bedtime.      Marland Kitchen levothyroxine (SYNTHROID, LEVOTHROID) 175 MCG tablet Take 175 mcg by mouth every morning.       Marland Kitchen oxyCODONE (OXY IR/ROXICODONE) 5 MG  immediate release tablet Take 1 tablet (5 mg total) by mouth every 6 (six) hours as needed.  20 tablet  0  . oxyCODONE-acetaminophen (PERCOCET/ROXICET) 5-325 MG per tablet Take 1 tablet by mouth every 4 (four) hours as needed for severe pain.  6 tablet  0  . saxagliptin HCl (ONGLYZA) 5 MG TABS tablet Take 5 mg by mouth at bedtime.       Marland Kitchen dexamethasone (DECADRON) 4 MG tablet        No current facility-administered medications for this visit.    ROS: See HPI for pertinent positives and negatives.   Physical Examination  Filed Vitals:   01/11/13 1416  BP: 105/63  Pulse: 51  Temp: 98.7 F (37.1 C)  Resp: 14  SaO2 is 97% on RA. Filed Weights   01/11/13 1416  Weight: 200 lb (90.719 kg)   Body mass index is 28.7 kg/(m^2).   General: A&O x 3, WDWN,  Gait: in wheelchair Eyes: Pupils equal, no abnormalities noted. Pulmonary: Respirations are non-labored, no cough. Cardiac: Pulse is regular.         Carotid Bruits Left Right   Negative Negative  Aorta: is not palpable Radial pulses: 1+ right, left is not palpable. HD access right upper arm with palpable thrill.                           VASCULAR EXAM: Extremities Right foot with strong foul odor, necrotic tissue at lateral aspect of amputation site and evidence of poor perfusion to plantar surface adjacent to amputation site. Small amount serro-sanguinous drainage, no purulent drainage.                                                                                                          LE Pulses LEFT RIGHT       FEMORAL   palpable   palpable        POPLITEAL   Not palpable   not palpable       POSTERIOR TIBIAL  not palpable   not palpable        DORSALIS PEDIS      ANTERIOR TIBIAL not palpable  not palpable    Abdomen: soft, NT, no masses. Skin: no rashes. Musculoskeletal: no muscle wasting or atrophy.  Neurologic: A&O X 3; Appropriate Affect ; SENSATION: normal; MOTOR FUNCTION:  moving all extremities equally.  Speech is fluent/normal. CN grossly intact.  ASSESSMENT: ACXEL DINGEE is a 67 y.o. male who presents s/p transmetatarsal amputation of the right foot December 16, 2012 for foul odor from the amputation site for the last couple of weeks. Dr. Myra Gianotti debrided right foot amputation site, adequate bleeding.  PLAN:  Based on today's exam and after discussing with Dr. Myra Gianotti:  Levaquin 500 mg po daily for 10 days. Has been taking 5 mg oxycodone q6h prn which he states has been adequate for pain control, renewed, #20, no refill. Wet to dry dressings daily, home health nursing to continue. Follow up with Dr. Darrick Penna next week.  The patient was given information about PAD including signs, symptoms, treatment, what symptoms should prompt the patient to seek immediate medical care, and risk reduction measures to take.  Charisse March, RN, MSN, FNP-C [facility] [place] Phone: [phone]  Clinic MD: Myra Gianotti  01/11/2013 2:56 PM

## 2013-01-12 ENCOUNTER — Encounter: Payer: Self-pay | Admitting: *Deleted

## 2013-01-21 ENCOUNTER — Other Ambulatory Visit: Payer: Self-pay | Admitting: Family

## 2013-01-21 NOTE — ED Provider Notes (Signed)
CSN: [id number]     Arrival date & time 01/08/13  1928 History   First MD Initiated Contact with Patient 01/09/13 0407     Chief Complaint  Patient presents with  . Foot Pain  . Wound Infection   (Consider location/radiation/quality/duration/timing/severity/associated sxs/prior Treatment) HPI  Please note that this is a late entry. This patient was seen by me in the [facility] ED on 12/27 shortly after his arrival to the ED.   He is a 68 yo dialysis patient with DM, multiple myeloma and several other comorbidities. He presents with complaints of erythema of right foot and drainage via wound vac. He is s/p debridement by vascular surgery and has follow up with vascular surgery. He says he does not know who to change out wound vac. Says that home health nurse told him to come to the ED because she is concerned that the wound is infected.   The patient denies fever. He has moderate pain. He says he was not discharged from recent hospitalization on an antibiotic and does not believe he is receiving abx with H/D.   Pain is worse when the affected limb is in a dependent position. Better with elevation. Acuchecks have been in the 200 range.   Past Medical History  Diagnosis Date  . Hypertension   . Diabetes mellitus without complication   . Shortness of breath   . Pneumonia     2012  . Heart murmur   . Glaucoma   . Multiple myeloma, without mention of having achieved remission(203.00) 03/30/2012    Cytogenetic neg on 03/23/2012.  . End stage renal disease on dialysis   . Asthma   . Hyperparathyroidism, secondary renal    Past Surgical History  Procedure Laterality Date  . Thyroidectomy    . Cervical disc surgery    . Eye surgery      CATARACTS  . Insertion of dialysis catheter Right 03/19/2012    Procedure: INSERTION OF DIALYSIS CATHETER;  Surgeon: Mal Misty, MD;  Location: [place];  Service: Vascular;  Laterality: Right;  Right Internal Jugular  . Av fistula placement Left 03/25/2012   Procedure: ARTERIOVENOUS (AV) FISTULA CREATION;  Surgeon: Mal Misty, MD;  Location: [place];  Service: Vascular;  Laterality: Left;  . Ligation of competing branches of arteriovenous fistula Left 05/08/2012    Procedure: LIGATION OF COMPETING BRANCHES OF ARTERIOVENOUS FISTULA;  Surgeon: Mal Misty, MD;  Location: [place];  Service: Vascular;  Laterality: Left;  Ultrasound guided  . Cardiac catheterization      approx 30 years ago  . Amputation Right 11/10/2012    Procedure: AMPUTATION FIRST and SECOND TOES Right Foot;  Surgeon: Elam Dutch, MD;  Location: [facility] OR;  Service: Vascular;  Laterality: Right;  . Transmetatarsal amputation Left 12/16/2012    Procedure: TRANSMETATARSAL AMPUTATION AND VAC PLACEMENT;  Surgeon: Elam Dutch, MD;  Location: [facility] OR;  Service: Vascular;  Laterality: Left;   Family History  Problem Relation Age of Onset  . Diabetes Mother   . Cancer Mother     bone   . Kidney disease Father    History  Substance Use Topics  . Smoking status: Former Smoker    Types: Cigarettes    Quit date: 03/19/1982  . Smokeless tobacco: Never Used  . Alcohol Use: No     Comment: formerly    Review of Systems 10 point ROS obtained and is negative with the exception of sx noted above.   Allergies  Lisinopril; Ivp dye; and Morphine and related  Home Medications   Current Outpatient Rx  Name  Route  Sig  Dispense  Refill  . aspirin EC 81 MG tablet   Oral   Take 81 mg by mouth daily.         Marland Kitchen b complex-vitamin c-folic acid (NEPHRO-VITE) 0.8 MG TABS tablet   Oral   Take 1 tablet by mouth daily.         Marland Kitchen BYSTOLIC 5 MG tablet   Oral   Take 5 mg by mouth daily.         . calcium acetate (PHOSLO) 667 MG capsule   Oral   Take 2,001 mg by mouth 3 (three) times daily with meals.         . doxazosin (CARDURA) 2 MG tablet   Oral   Take 2 mg by mouth at bedtime.         . gabapentin (NEURONTIN) 400 MG capsule   Oral   Take 400 mg by mouth at  bedtime.         Marland Kitchen levothyroxine (SYNTHROID, LEVOTHROID) 175 MCG tablet   Oral   Take 175 mcg by mouth every morning.          . saxagliptin HCl (ONGLYZA) 5 MG TABS tablet   Oral   Take 5 mg by mouth at bedtime.          Marland Kitchen dexamethasone (DECADRON) 4 MG tablet               . levofloxacin (LEVAQUIN) 500 MG tablet   Oral   Take 1 tablet (500 mg total) by mouth daily.   10 tablet   0   . oxyCODONE (OXY IR/ROXICODONE) 5 MG immediate release tablet   Oral   Take 1 tablet (5 mg total) by mouth every 6 (six) hours as needed.   20 tablet   0   . oxyCODONE-acetaminophen (PERCOCET/ROXICET) 5-325 MG per tablet   Oral   Take 1 tablet by mouth every 4 (four) hours as needed for severe pain.   6 tablet   0    BP 109/58  Pulse 54  Temp(Src) 97.8 F (36.6 C) (Oral)  Resp 16  Wt 199 lb (90.266 kg)  SpO2 94% Physical Exam  Constitutional: He is oriented to person, place, and time. He appears well-developed and well-nourished.  HENT:  Head: Normocephalic and atraumatic.  Eyes: Pupils are equal, round, and reactive to light.  Neck: Neck supple.  Cardiovascular: Normal rate, regular rhythm and normal heart sounds.   No murmur heard. Pulmonary/Chest: Effort normal and breath sounds normal. No respiratory distress.  Abdominal: Soft. Bowel sounds are normal. He exhibits no distension.  Musculoskeletal:  RLE: wound vac in place, mid and forefoot are erythematous and warm to touch over the dorsal surface. Skin is warm and there is a faintly palpable DP pulse. Patient able to wiggle toes.   Neurological: He is alert and oriented to person, place, and time. No cranial nerve deficit. Coordination normal.  Skin: Skin is warm and dry.  Psychiatric: He has a normal mood and affect.    ED Course  Procedures (including critical care time) Labs Review Labs Reviewed  CBC WITH DIFFERENTIAL - Abnormal; Notable for the following:    RBC 3.76 (*)    Hemoglobin 9.8 (*)    HCT 32.6 (*)     RDW 16.2 (*)    All other components within normal limits  COMPREHENSIVE METABOLIC PANEL -  Abnormal; Notable for the following:    Chloride 93 (*)    CO2 34 (*)    Glucose, Bld 214 (*)    Creatinine, Ser 5.24 (*)    Albumin 3.2 (*)    GFR calc non Af Amer 10 (*)    GFR calc Af Amer 12 (*)    All other components within normal limits  SEDIMENTATION RATE - Abnormal; Notable for the following:    Sed Rate 86 (*)    All other components within normal limits     MDM   1. Cellulitis of foot   2. End stage renal disease   3. Diabetes mellitus    Case discussed with nephrologist who will initiate abx treatement with Vancomycin with hemodilaysis later in the day. Patient will schedule follow up with his vascular surgeon and is referred to the wound care clinic as well. He is, overall, nontoxic and well hydrated appearing - afebrile and without signs of sepsis. I believe we may safely initiate abx tx of infection as outpatient with strong f/u in place and counsel re: return precautions.    [name], MD 01/21/13 [phone]

## 2013-01-26 ENCOUNTER — Encounter (HOSPITAL_BASED_OUTPATIENT_CLINIC_OR_DEPARTMENT_OTHER): Payer: Medicare Other | Attending: General Surgery

## 2013-01-26 DIAGNOSIS — E119 Type 2 diabetes mellitus without complications: Secondary | ICD-10-CM | POA: Insufficient documentation

## 2013-01-26 DIAGNOSIS — N400 Enlarged prostate without lower urinary tract symptoms: Secondary | ICD-10-CM | POA: Insufficient documentation

## 2013-01-26 DIAGNOSIS — Z992 Dependence on renal dialysis: Secondary | ICD-10-CM | POA: Insufficient documentation

## 2013-01-26 DIAGNOSIS — N186 End stage renal disease: Secondary | ICD-10-CM | POA: Insufficient documentation

## 2013-01-26 DIAGNOSIS — S98139A Complete traumatic amputation of one unspecified lesser toe, initial encounter: Secondary | ICD-10-CM | POA: Insufficient documentation

## 2013-01-26 DIAGNOSIS — T8189XA Other complications of procedures, not elsewhere classified, initial encounter: Secondary | ICD-10-CM | POA: Insufficient documentation

## 2013-01-26 DIAGNOSIS — Y835 Amputation of limb(s) as the cause of abnormal reaction of the patient, or of later complication, without mention of misadventure at the time of the procedure: Secondary | ICD-10-CM | POA: Insufficient documentation

## 2013-01-26 DIAGNOSIS — I120 Hypertensive chronic kidney disease with stage 5 chronic kidney disease or end stage renal disease: Secondary | ICD-10-CM | POA: Insufficient documentation

## 2013-01-26 DIAGNOSIS — Z79899 Other long term (current) drug therapy: Secondary | ICD-10-CM | POA: Insufficient documentation

## 2013-01-26 DIAGNOSIS — I739 Peripheral vascular disease, unspecified: Secondary | ICD-10-CM | POA: Insufficient documentation

## 2013-01-27 ENCOUNTER — Encounter: Payer: Self-pay | Admitting: Vascular Surgery

## 2013-01-27 NOTE — H&P (Signed)
NAME:  Jack [name], Jack [name] NO.:  [id number]  MEDICAL RECORD NO.:  [id number]  LOCATION:  FOOT                         FACILITY:  [facility]  PHYSICIAN:  Elesa Hacker, M.D.        DATE OF BIRTH:  07/10/1945  DATE OF ADMISSION:  01/26/2013 DATE OF DISCHARGE:                             HISTORY & PHYSICAL   CHIEF COMPLAINT:  Unhealed amputation wound.  HISTORY OF PRESENT ILLNESS:  This patient developed rapid onset gangrene.  He had amputation of 2 toes and then a week later, the completion transmet amputation.  This has essentially failed to heal. The first amputation was in November.  The last amputation was December 2014.  The wound has been treated with a VAC.  PAST MEDICAL HISTORY:  Significant for type 2 diabetes, end-stage renal disease with dialysis for the last 7 months, hypertension and benign prostatic hypertrophy.  PAST SURGICAL HISTORY:  Fistula left arm, anterior cervical diskectomy, cataracts and a right transmetatarsal amputation in December 2014.  SOCIAL HISTORY:  Cigarettes none.  Alcohol none.  MEDICATIONS:  Aspirin, vitamins, calcium acetate, doxazosin, gabapentin, levothyroxine, oxycodone, and other hypertensive medications.  He does not appear to be taking any diabetic medications at the present time.  ALLERGIES:  Lisinopril, IVP dye, and morphine.  REVIEW OF SYSTEMS:  As above.  PHYSICAL EXAMINATION:  VITAL SIGNS:  Temperature 98.3, pulse 55, respirations 18, and blood pressure 147/66. GENERAL APPEARANCE:  Well developed, well nourished, no distress. EXTREMITIES:  Examination of the extremity reveals an ABI of 0.82.  His amputation was performed by a vascular surgeon, and he has had a vascular workup.  At the transmet site, there is a 4.3 x 10.0 wound. This is somewhat foul-smelling and debridement of a great deal of necrotic subcutaneous tissue skin and tendons was performed sharply.  IMPRESSION:  Unhealed amputation stump right  transmetatarsal, and the patient with peripheral arterial disease, and end-stage renal disease. We will start treatment with Santyl and Hydrogel.  We will hold off wound VAC for the time being, although we may use it again.  I am afraid, I can only be hopeful if not optimistic about ultimate healing of this wound.  We will see him in 7 days.     Elesa Hacker, M.D.     RA/MEDQ  D:  01/26/2013  T:  01/27/2013  Job:  [id number]

## 2013-01-28 ENCOUNTER — Ambulatory Visit (INDEPENDENT_AMBULATORY_CARE_PROVIDER_SITE_OTHER): Payer: Medicare Other | Admitting: Vascular Surgery

## 2013-01-28 ENCOUNTER — Encounter: Payer: Self-pay | Admitting: Vascular Surgery

## 2013-01-28 VITALS — BP 124/68 | HR 51 | Resp 16 | Ht 70.0 in | Wt 200.0 lb

## 2013-01-28 DIAGNOSIS — L98499 Non-pressure chronic ulcer of skin of other sites with unspecified severity: Principal | ICD-10-CM

## 2013-01-28 DIAGNOSIS — I739 Peripheral vascular disease, unspecified: Secondary | ICD-10-CM

## 2013-01-28 NOTE — Progress Notes (Signed)
Patient is a 68 year old male who returns for followup today. He underwent open right foot transmetatarsal amputation on December 3. He is currently being seen by Dr. Lindon Romp at the wound center.   Physical exam:  Filed Vitals:   01/28/13 1601  BP: 124/68  Pulse: 51  Resp: 16  Height: 5\' 10"  (1.778 m)  Weight: 200 lb (90.719 kg)    Right lower extremity: Open transmetatarsal amputation 8 x 3.5 cm open wound good granulation several spots of fibrinous exudate but 90% granulating  Assessment: Slowly healing right foot transmetatarsal amputation  Plan: Followup in 6 weeks if granulation tissue continues to improve consider skin graft  [name], MD [facility] Office: [phone] Pager: [phone]

## 2013-02-16 ENCOUNTER — Encounter (HOSPITAL_BASED_OUTPATIENT_CLINIC_OR_DEPARTMENT_OTHER): Payer: Medicare Other | Attending: General Surgery

## 2013-02-16 DIAGNOSIS — T8789 Other complications of amputation stump: Secondary | ICD-10-CM | POA: Insufficient documentation

## 2013-02-16 DIAGNOSIS — Y835 Amputation of limb(s) as the cause of abnormal reaction of the patient, or of later complication, without mention of misadventure at the time of the procedure: Secondary | ICD-10-CM | POA: Insufficient documentation

## 2013-02-16 DIAGNOSIS — L97509 Non-pressure chronic ulcer of other part of unspecified foot with unspecified severity: Secondary | ICD-10-CM | POA: Insufficient documentation

## 2013-02-16 DIAGNOSIS — E1169 Type 2 diabetes mellitus with other specified complication: Secondary | ICD-10-CM | POA: Insufficient documentation

## 2013-02-16 LAB — GLUCOSE, CAPILLARY: Glucose-Capillary: 104 mg/dL — ABNORMAL HIGH (ref 70–99)

## 2013-02-23 ENCOUNTER — Emergency Department (HOSPITAL_COMMUNITY): Payer: Medicare Other

## 2013-02-23 ENCOUNTER — Inpatient Hospital Stay (HOSPITAL_COMMUNITY)
Admission: EM | Admit: 2013-02-23 | Discharge: 2013-02-27 | DRG: 864 | Disposition: A | Discharge status: Home Health | Payer: Medicare Other | Attending: Internal Medicine | Admitting: Internal Medicine

## 2013-02-23 ENCOUNTER — Encounter (HOSPITAL_COMMUNITY): Payer: Self-pay | Admitting: Emergency Medicine

## 2013-02-23 DIAGNOSIS — Z87891 Personal history of nicotine dependence: Secondary | ICD-10-CM

## 2013-02-23 DIAGNOSIS — M869 Osteomyelitis, unspecified: Secondary | ICD-10-CM

## 2013-02-23 DIAGNOSIS — N186 End stage renal disease: Secondary | ICD-10-CM

## 2013-02-23 DIAGNOSIS — E108 Type 1 diabetes mellitus with unspecified complications: Secondary | ICD-10-CM

## 2013-02-23 DIAGNOSIS — G9340 Encephalopathy, unspecified: Secondary | ICD-10-CM

## 2013-02-23 DIAGNOSIS — E611 Iron deficiency: Secondary | ICD-10-CM

## 2013-02-23 DIAGNOSIS — G92 Toxic encephalopathy: Secondary | ICD-10-CM | POA: Diagnosis present

## 2013-02-23 DIAGNOSIS — Z79899 Other long term (current) drug therapy: Secondary | ICD-10-CM

## 2013-02-23 DIAGNOSIS — M86179 Other acute osteomyelitis, unspecified ankle and foot: Secondary | ICD-10-CM

## 2013-02-23 DIAGNOSIS — E871 Hypo-osmolality and hyponatremia: Secondary | ICD-10-CM

## 2013-02-23 DIAGNOSIS — R001 Bradycardia, unspecified: Secondary | ICD-10-CM

## 2013-02-23 DIAGNOSIS — E119 Type 2 diabetes mellitus without complications: Secondary | ICD-10-CM

## 2013-02-23 DIAGNOSIS — M899 Disorder of bone, unspecified: Secondary | ICD-10-CM | POA: Diagnosis present

## 2013-02-23 DIAGNOSIS — D472 Monoclonal gammopathy: Secondary | ICD-10-CM

## 2013-02-23 DIAGNOSIS — S98919A Complete traumatic amputation of unspecified foot, level unspecified, initial encounter: Secondary | ICD-10-CM

## 2013-02-23 DIAGNOSIS — Z7982 Long term (current) use of aspirin: Secondary | ICD-10-CM

## 2013-02-23 DIAGNOSIS — N2581 Secondary hyperparathyroidism of renal origin: Secondary | ICD-10-CM | POA: Diagnosis present

## 2013-02-23 DIAGNOSIS — C9000 Multiple myeloma not having achieved remission: Secondary | ICD-10-CM

## 2013-02-23 DIAGNOSIS — E869 Volume depletion, unspecified: Secondary | ICD-10-CM

## 2013-02-23 DIAGNOSIS — M949 Disorder of cartilage, unspecified: Secondary | ICD-10-CM

## 2013-02-23 DIAGNOSIS — R112 Nausea with vomiting, unspecified: Secondary | ICD-10-CM

## 2013-02-23 DIAGNOSIS — E872 Acidosis, unspecified: Secondary | ICD-10-CM

## 2013-02-23 DIAGNOSIS — D631 Anemia in chronic kidney disease: Secondary | ICD-10-CM

## 2013-02-23 DIAGNOSIS — R509 Fever, unspecified: Principal | ICD-10-CM

## 2013-02-23 DIAGNOSIS — I10 Essential (primary) hypertension: Secondary | ICD-10-CM

## 2013-02-23 DIAGNOSIS — J9601 Acute respiratory failure with hypoxia: Secondary | ICD-10-CM

## 2013-02-23 DIAGNOSIS — N185 Chronic kidney disease, stage 5: Secondary | ICD-10-CM

## 2013-02-23 DIAGNOSIS — R531 Weakness: Secondary | ICD-10-CM

## 2013-02-23 DIAGNOSIS — E8770 Fluid overload, unspecified: Secondary | ICD-10-CM

## 2013-02-23 DIAGNOSIS — N189 Chronic kidney disease, unspecified: Secondary | ICD-10-CM

## 2013-02-23 DIAGNOSIS — Z48812 Encounter for surgical aftercare following surgery on the circulatory system: Secondary | ICD-10-CM

## 2013-02-23 DIAGNOSIS — M79609 Pain in unspecified limb: Secondary | ICD-10-CM

## 2013-02-23 DIAGNOSIS — E039 Hypothyroidism, unspecified: Secondary | ICD-10-CM

## 2013-02-23 DIAGNOSIS — I739 Peripheral vascular disease, unspecified: Secondary | ICD-10-CM

## 2013-02-23 DIAGNOSIS — L98499 Non-pressure chronic ulcer of skin of other sites with unspecified severity: Secondary | ICD-10-CM

## 2013-02-23 DIAGNOSIS — Z4931 Encounter for adequacy testing for hemodialysis: Secondary | ICD-10-CM

## 2013-02-23 DIAGNOSIS — A088 Other specified intestinal infections: Secondary | ICD-10-CM | POA: Diagnosis present

## 2013-02-23 DIAGNOSIS — D649 Anemia, unspecified: Secondary | ICD-10-CM | POA: Diagnosis not present

## 2013-02-23 DIAGNOSIS — I120 Hypertensive chronic kidney disease with stage 5 chronic kidney disease or end stage renal disease: Secondary | ICD-10-CM | POA: Diagnosis present

## 2013-02-23 DIAGNOSIS — Z992 Dependence on renal dialysis: Secondary | ICD-10-CM

## 2013-02-23 DIAGNOSIS — R197 Diarrhea, unspecified: Secondary | ICD-10-CM

## 2013-02-23 DIAGNOSIS — G929 Unspecified toxic encephalopathy: Secondary | ICD-10-CM | POA: Diagnosis present

## 2013-02-23 LAB — CBC WITH DIFFERENTIAL/PLATELET
BASOS ABS: 0 10*3/uL (ref 0.0–0.1)
BASOS PCT: 0 % (ref 0–1)
EOS PCT: 1 % (ref 0–5)
Eosinophils Absolute: 0 10*3/uL (ref 0.0–0.7)
HCT: 44.8 % (ref 39.0–52.0)
HEMOGLOBIN: 13.9 g/dL (ref 13.0–17.0)
LYMPHS PCT: 7 % — AB (ref 12–46)
Lymphs Abs: 0.5 10*3/uL — ABNORMAL LOW (ref 0.7–4.0)
MCH: 27 pg (ref 26.0–34.0)
MCHC: 31 g/dL (ref 30.0–36.0)
MCV: 87.2 fL (ref 78.0–100.0)
Monocytes Absolute: 0.5 10*3/uL (ref 0.1–1.0)
Monocytes Relative: 6 % (ref 3–12)
Neutro Abs: 6.5 10*3/uL (ref 1.7–7.7)
Neutrophils Relative %: 87 % — ABNORMAL HIGH (ref 43–77)
Platelets: 190 10*3/uL (ref 150–400)
RBC: 5.14 MIL/uL (ref 4.22–5.81)
RDW: 16.9 % — AB (ref 11.5–15.5)
WBC: 7.5 10*3/uL (ref 4.0–10.5)

## 2013-02-23 LAB — COMPREHENSIVE METABOLIC PANEL
ALT: 21 U/L (ref 0–53)
AST: 106 U/L — AB (ref 0–37)
Albumin: 3.3 g/dL — ABNORMAL LOW (ref 3.5–5.2)
Alkaline Phosphatase: 40 U/L (ref 39–117)
BILIRUBIN TOTAL: 0.4 mg/dL (ref 0.3–1.2)
BUN: 25 mg/dL — ABNORMAL HIGH (ref 6–23)
CHLORIDE: 98 meq/L (ref 96–112)
CO2: 24 meq/L (ref 19–32)
CREATININE: 6.34 mg/dL — AB (ref 0.50–1.35)
Calcium: 9.3 mg/dL (ref 8.4–10.5)
GFR calc Af Amer: 9 mL/min — ABNORMAL LOW (ref >90)
GFR, EST NON AFRICAN AMERICAN: 8 mL/min — AB (ref >90)
Glucose, Bld: 138 mg/dL — ABNORMAL HIGH (ref 70–99)
Potassium: 7.6 mEq/L (ref 3.7–5.3)
Sodium: 139 mEq/L (ref 137–147)
Total Protein: 8.1 g/dL (ref 6.0–8.3)

## 2013-02-23 LAB — URINE MICROSCOPIC-ADD ON

## 2013-02-23 LAB — URINALYSIS, ROUTINE W REFLEX MICROSCOPIC
Glucose, UA: NEGATIVE mg/dL
KETONES UR: NEGATIVE mg/dL
NITRITE: NEGATIVE
Specific Gravity, Urine: 1.019 (ref 1.005–1.030)
UROBILINOGEN UA: 0.2 mg/dL (ref 0.0–1.0)
pH: 6 (ref 5.0–8.0)

## 2013-02-23 LAB — POCT I-STAT, CHEM 8
BUN: 27 mg/dL — AB (ref 6–23)
Calcium, Ion: 1.14 mmol/L (ref 1.13–1.30)
Chloride: 102 mEq/L (ref 96–112)
Creatinine, Ser: 6.7 mg/dL — ABNORMAL HIGH (ref 0.50–1.35)
Glucose, Bld: 144 mg/dL — ABNORMAL HIGH (ref 70–99)
HEMATOCRIT: 50 % (ref 39.0–52.0)
Hemoglobin: 17 g/dL (ref 13.0–17.0)
Potassium: 4.6 mEq/L (ref 3.7–5.3)
SODIUM: 141 meq/L (ref 137–147)
TCO2: 29 mmol/L (ref 0–100)

## 2013-02-23 LAB — GLUCOSE, CAPILLARY
GLUCOSE-CAPILLARY: 130 mg/dL — AB (ref 70–99)
Glucose-Capillary: 117 mg/dL — ABNORMAL HIGH (ref 70–99)

## 2013-02-23 LAB — PROTIME-INR
INR: 1.02 (ref 0.00–1.49)
PROTHROMBIN TIME: 13.2 s (ref 11.6–15.2)

## 2013-02-23 MED ORDER — INSULIN ASPART 100 UNIT/ML ~~LOC~~ SOLN
0.0000 [IU] | Freq: Three times a day (TID) | SUBCUTANEOUS | Status: DC
  Administered 2013-02-24 – 2013-02-26 (×4): 1 [IU] via SUBCUTANEOUS

## 2013-02-23 MED ORDER — ACETAMINOPHEN 325 MG PO TABS
650.0000 mg | ORAL_TABLET | ORAL | Status: DC | PRN
  Administered 2013-02-24 (×2): 650 mg via ORAL
  Filled 2013-02-23 (×2): qty 2

## 2013-02-23 MED ORDER — OXYCODONE HCL 5 MG PO TABS
5.0000 mg | ORAL_TABLET | Freq: Four times a day (QID) | ORAL | Status: DC | PRN
  Administered 2013-02-24 – 2013-02-25 (×2): 5 mg via ORAL
  Filled 2013-02-23 (×2): qty 1

## 2013-02-23 MED ORDER — CALCIUM ACETATE 667 MG PO CAPS
2001.0000 mg | ORAL_CAPSULE | Freq: Three times a day (TID) | ORAL | Status: DC
  Administered 2013-02-24 – 2013-02-25 (×4): 2001 mg via ORAL
  Filled 2013-02-23 (×10): qty 3

## 2013-02-23 MED ORDER — DEXTROSE 5 % IV SOLN
2.0000 g | INTRAVENOUS | Status: DC
  Administered 2013-02-24: 2 g via INTRAVENOUS
  Filled 2013-02-23 (×3): qty 2

## 2013-02-23 MED ORDER — PIPERACILLIN-TAZOBACTAM 3.375 G IVPB 30 MIN
3.3750 g | Freq: Once | INTRAVENOUS | Status: AC
  Administered 2013-02-23: 3.375 g via INTRAVENOUS
  Filled 2013-02-23: qty 50

## 2013-02-23 MED ORDER — ACETAMINOPHEN 325 MG PO TABS
650.0000 mg | ORAL_TABLET | Freq: Once | ORAL | Status: AC
  Administered 2013-02-23: 650 mg via ORAL
  Filled 2013-02-23: qty 2

## 2013-02-23 MED ORDER — NEBIVOLOL HCL 5 MG PO TABS
5.0000 mg | ORAL_TABLET | Freq: Every day | ORAL | Status: DC
  Administered 2013-02-25 – 2013-02-26 (×2): 5 mg via ORAL
  Filled 2013-02-23 (×4): qty 1

## 2013-02-23 MED ORDER — DOXAZOSIN MESYLATE 2 MG PO TABS
2.0000 mg | ORAL_TABLET | Freq: Every day | ORAL | Status: DC
  Administered 2013-02-25 – 2013-02-26 (×2): 2 mg via ORAL
  Filled 2013-02-23 (×5): qty 1

## 2013-02-23 MED ORDER — VANCOMYCIN HCL IN DEXTROSE 750-5 MG/150ML-% IV SOLN
750.0000 mg | INTRAVENOUS | Status: DC
  Filled 2013-02-23: qty 150

## 2013-02-23 MED ORDER — ASPIRIN EC 81 MG PO TBEC
81.0000 mg | DELAYED_RELEASE_TABLET | Freq: Every day | ORAL | Status: DC
  Administered 2013-02-24 – 2013-02-27 (×4): 81 mg via ORAL
  Filled 2013-02-23 (×4): qty 1

## 2013-02-23 MED ORDER — VANCOMYCIN HCL 10 G IV SOLR
1500.0000 mg | Freq: Once | INTRAVENOUS | Status: AC
  Administered 2013-02-23: 1500 mg via INTRAVENOUS
  Filled 2013-02-23: qty 1500

## 2013-02-23 MED ORDER — LEVOTHYROXINE SODIUM 175 MCG PO TABS
175.0000 ug | ORAL_TABLET | Freq: Every morning | ORAL | Status: DC
  Administered 2013-02-24 – 2013-02-27 (×4): 175 ug via ORAL
  Filled 2013-02-23 (×4): qty 1

## 2013-02-23 MED ORDER — LINAGLIPTIN 5 MG PO TABS
5.0000 mg | ORAL_TABLET | Freq: Every day | ORAL | Status: DC
  Administered 2013-02-24 – 2013-02-27 (×4): 5 mg via ORAL
  Filled 2013-02-23 (×4): qty 1

## 2013-02-23 NOTE — ED Notes (Signed)
Reported potassium level 7.6 to ER MD.

## 2013-02-23 NOTE — ED Notes (Signed)
MD at bedside. (Dr. Regenia Skeeter updated family on plan of care and admission).

## 2013-02-23 NOTE — Progress Notes (Signed)
ANTIBIOTIC CONSULT NOTE - INITIAL  Pharmacy Consult for Vancomycin and Cefepime Indication: rule out sepsis  Allergies  Allergen Reactions  . Lisinopril Cough  . Ivp Dye [Iodinated Diagnostic Agents] Hives  . Morphine And Related Other (See Comments)    Bradycardia states patient    Patient Measurements: Height: 5' 10.08" (178 cm) Weight: 198 lb 6.6 oz (90 kg) IBW/kg (Calculated) : 73.18  Vital Signs: Temp: 99.7 F (37.6 C) (02/10 2314) Temp src: Oral (02/10 2314) BP: 138/64 mmHg (02/10 2314) Pulse Rate: 67 (02/10 2314) Intake/Output from previous day:   Intake/Output from this shift: Total I/O In: 50 [I.V.:50] Out: 20 [Urine:20]  Labs:  Recent Labs  02/23/13 1808 02/23/13 1852 02/23/13 1931  WBC  --   --  7.5  HGB  --  17.0 13.9  PLT  --   --  190  CREATININE 6.34* 6.70*  --    Estimated Creatinine Clearance: 11.8 ml/min (by C-G formula based on Cr of 6.7). No results found for this basename: VANCOTROUGH, VANCOPEAK, VANCORANDOM, GENTTROUGH, GENTPEAK, GENTRANDOM, TOBRATROUGH, TOBRAPEAK, TOBRARND, AMIKACINPEAK, AMIKACINTROU, AMIKACIN,  in the last 72 hours   Microbiology: No results found for this or any previous visit (from the past 720 hour(s)).  Medical History: Past Medical History  Diagnosis Date  . Hypertension   . Diabetes mellitus without complication   . Shortness of breath   . Pneumonia     2012  . Heart murmur   . Glaucoma   . Multiple myeloma, without mention of having achieved remission 03/30/2012    Cytogenetic neg on 03/23/2012.  . End stage renal disease on dialysis   . Asthma   . Hyperparathyroidism, secondary renal     Medications:  Prescriptions prior to admission  Medication Sig Dispense Refill  . aspirin EC 81 MG tablet Take 81 mg by mouth daily.      Marland Kitchen b complex-vitamin c-folic acid (NEPHRO-VITE) 0.8 MG TABS tablet Take 1 tablet by mouth daily.      Marland Kitchen BYSTOLIC 5 MG tablet Take 5 mg by mouth daily.      . calcium acetate  (PHOSLO) 667 MG capsule Take 2,001 mg by mouth 3 (three) times daily with meals.      Marland Kitchen dexamethasone (DECADRON) 4 MG tablet       . doxazosin (CARDURA) 2 MG tablet Take 2 mg by mouth at bedtime.      . gabapentin (NEURONTIN) 400 MG capsule Take 400 mg by mouth at bedtime.      Marland Kitchen levofloxacin (LEVAQUIN) 500 MG tablet Take 1 tablet (500 mg total) by mouth daily.  10 tablet  0  . levothyroxine (SYNTHROID, LEVOTHROID) 175 MCG tablet Take 175 mcg by mouth every morning.       Marland Kitchen oxyCODONE (OXY IR/ROXICODONE) 5 MG immediate release tablet Take 5 mg by mouth every 6 (six) hours as needed for moderate pain.      Marland Kitchen SANTYL ointment Apply 1 application topically daily.       . saxagliptin HCl (ONGLYZA) 5 MG TABS tablet Take 5 mg by mouth at bedtime.        Assessment: 68 yo male with AMS/fever, possible sepsis, forempiric antibiotics.  Vancomycin 1500 m g IV given in ED at  2200  Goal of Therapy:  Vancomycin pre-HD level 15-25  Plan:  Vancomycin 750 mg IV after each HD Cefepime 2 g IV now, then after each HD  Hoyle Barkdull, Bronson Curb 02/23/2013,11:44 PM

## 2013-02-23 NOTE — ED Notes (Signed)
MD at bedside. 

## 2013-02-23 NOTE — ED Notes (Signed)
Patient transported to CT 

## 2013-02-23 NOTE — ED Notes (Signed)
Pt arrived via EMS. Pt reported to EMS that he has been fatigued and feeling progressively weaker throughout the day since the AM. Pt told EMS he vomited x 3. Pt denies pain.  Pt is currently experiencing chills and teeth are chattering.  Family states that they have been sick recently.  BP- 144/77 HR- 80 RR-20 Temp-100.2  SpO2- 100%

## 2013-02-23 NOTE — ED Notes (Signed)
Patient has expressive dysphagia, and has difficulty moving extremities.  Discussed with ER MD.

## 2013-02-23 NOTE — H&P (Signed)
[facility] History and Physical  Jack [name] YFR:[id number] DOB: 1945-05-09 DOA: 02/23/2013  Referring physician: ER physician. PCP: [name], MD   Chief Complaint: Altered mental status.  History obtained from patient's family ED physician and previous charts.  HPI: Jack [name] is a 68 y.o. male with known history of ESRD hemodialysis Monday Wednesday Friday, recent admitted for acute osteomyelitis of the right foot and has had transmetatarsal amputation in December and has had wound VAC placed since then was found to be confused by the family later this evening. Patient had also had nausea vomiting and diarrhea. Patient's family states that patient was doing fine in the morning later when they came in the evening patient was found with vomitus and diarrhea all over. Patient was found to be febrile in the ER. CT head did not show anything acute. Patient's mental status is slowly improved at this time and has periods when he gets confused. Chest x-ray was unremarkable. UA shows possibility of UTI. CT abdomen and pelvis is pending but abdomen appears benign on exam. On exam patient is a right eye does not abduct completely. Patient's family does not know if this is a new finding. Patient had a recent laser eye surgery on the right eye 2 weeks ago. Patient otherwise did not complain of any chest pain or shortness of breath. On exam patient is able to move all extremities but mildly weak on the left upper extremity. Patient has been in pretty started on vancomycin and cefepime. Blood cultures and urine cultures have been ordered. Family states that others in the family who is having nausea vomiting and diarrhea last few days.  Review of Systems: As presented in the history of presenting illness, rest negative.  Past Medical History  Diagnosis Date  . Hypertension   . Diabetes mellitus without complication   . Shortness of breath   . Pneumonia     2012  . Heart murmur    . Glaucoma   . Multiple myeloma, without mention of having achieved remission 03/30/2012    Cytogenetic neg on 03/23/2012.  . End stage renal disease on dialysis   . Asthma   . Hyperparathyroidism, secondary renal    Past Surgical History  Procedure Laterality Date  . Thyroidectomy    . Cervical disc surgery    . Eye surgery      CATARACTS  . Insertion of dialysis catheter Right 03/19/2012    Procedure: INSERTION OF DIALYSIS CATHETER;  Surgeon: Mal Misty, MD;  Location: [facility];  Service: Vascular;  Laterality: Right;  Right Internal Jugular  . Av fistula placement Left 03/25/2012    Procedure: ARTERIOVENOUS (AV) FISTULA CREATION;  Surgeon: Mal Misty, MD;  Location: [facility];  Service: Vascular;  Laterality: Left;  . Ligation of competing branches of arteriovenous fistula Left 05/08/2012    Procedure: LIGATION OF COMPETING BRANCHES OF ARTERIOVENOUS FISTULA;  Surgeon: Mal Misty, MD;  Location: [facility];  Service: Vascular;  Laterality: Left;  Ultrasound guided  . Cardiac catheterization      approx 30 years ago  . Amputation Right 11/10/2012    Procedure: AMPUTATION FIRST and SECOND TOES Right Foot;  Surgeon: Elam Dutch, MD;  Location: [facility] OR;  Service: Vascular;  Laterality: Right;  . Transmetatarsal amputation Left 12/16/2012    Procedure: TRANSMETATARSAL AMPUTATION AND VAC PLACEMENT;  Surgeon: Elam Dutch, MD;  Location: [facility];  Service: Vascular;  Laterality: Left;   Social History:  reports that he  quit smoking about 30 years ago. His smoking use included Cigarettes. He smoked 0.00 packs per day. He has never used smokeless tobacco. He reports that he does not drink alcohol or use illicit drugs. Where does patient live home. Can patient participate in ADLs? Not sure.  Allergies  Allergen Reactions  . Lisinopril Cough  . Ivp Dye [Iodinated Diagnostic Agents] Hives  . Morphine And Related Other (See Comments)    Bradycardia states patient    Family History:   Family History  Problem Relation Age of Onset  . Diabetes Mother   . Cancer Mother     bone   . Kidney disease Father       Prior to Admission medications   Medication Sig Start Date End Date Taking? Authorizing Provider  aspirin EC 81 MG tablet Take 81 mg by mouth daily.   Yes Historical Provider, MD  b complex-vitamin c-folic acid (NEPHRO-VITE) 0.8 MG TABS tablet Take 1 tablet by mouth daily.   Yes Historical Provider, MD  BYSTOLIC 5 MG tablet Take 5 mg by mouth daily. 09/09/12  Yes Historical Provider, MD  calcium acetate (PHOSLO) 667 MG capsule Take 2,001 mg by mouth 3 (three) times daily with meals.   Yes Historical Provider, MD  dexamethasone (DECADRON) 4 MG tablet  12/30/12  Yes Historical Provider, MD  doxazosin (CARDURA) 2 MG tablet Take 2 mg by mouth at bedtime.   Yes Historical Provider, MD  gabapentin (NEURONTIN) 400 MG capsule Take 400 mg by mouth at bedtime.   Yes Historical Provider, MD  levofloxacin (LEVAQUIN) 500 MG tablet Take 1 tablet (500 mg total) by mouth daily. 01/11/13  Yes Suzanne L Nickel, NP  levothyroxine (SYNTHROID, LEVOTHROID) 175 MCG tablet Take 175 mcg by mouth every morning.    Yes Historical Provider, MD  oxyCODONE (OXY IR/ROXICODONE) 5 MG immediate release tablet Take 5 mg by mouth every 6 (six) hours as needed for moderate pain. 01/11/13  Yes Sharmon Leyden Nickel, NP  SANTYL ointment Apply 1 application topically daily.  02/22/13  Yes Historical Provider, MD  saxagliptin HCl (ONGLYZA) 5 MG TABS tablet Take 5 mg by mouth at bedtime.    Yes Historical Provider, MD    Physical Exam: Filed Vitals:   02/23/13 2130 02/23/13 2215 02/23/13 2230 02/23/13 2255  BP: 119/52 128/51 130/63   Pulse: 70 65 67   Temp:    101.3 F (38.5 C)  TempSrc:      Resp: '16 17 17   ' SpO2: 96% 96% 96%      General:  Well-developed and nourished.  Eyes: Anicteric no pallor.  ENT: No discharge from the ears eyes nose mouth.  Neck: No mass felt. No neck  rigidity.  Cardiovascular: S1-S2 heard.  Respiratory: No rhonchi or crepitations.  Abdomen: Soft nontender bowel sounds present. No guarding no rigidity.  Skin: Chronic skin changes in the lower extremities.  Musculoskeletal: Wound VAC on the right lower extremity. I do not see any active discharge from the right foot. Left foot has chronic skin changes.  Psychiatric: Patient presently is alert awake oriented to his name and place.  Neurologic: Moves all extremities mildly weak on the left upper extremity. Patient is not able to abduct his right eye. PERRLA positive. No facial asymmetry.  Labs on Admission:  Basic Metabolic Panel:  Recent Labs Lab 02/23/13 1808 02/23/13 1852  NA 139 141  K 7.6* 4.6  CL 98 102  CO2 24  --   GLUCOSE 138* 144*  BUN  25* 27*  CREATININE 6.34* 6.70*  CALCIUM 9.3  --    Liver Function Tests:  Recent Labs Lab 02/23/13 1808  AST 106*  ALT 21  ALKPHOS 40  BILITOT 0.4  PROT 8.1  ALBUMIN 3.3*   No results found for this basename: LIPASE, AMYLASE,  in the last 168 hours No results found for this basename: AMMONIA,  in the last 168 hours CBC:  Recent Labs Lab 02/23/13 1852 02/23/13 1931  WBC  --  7.5  NEUTROABS  --  6.5  HGB 17.0 13.9  HCT 50.0 44.8  MCV  --  87.2  PLT  --  190   Cardiac Enzymes: No results found for this basename: CKTOTAL, CKMB, CKMBINDEX, TROPONINI,  in the last 168 hours  BNP (last 3 results)  Recent Labs  03/18/12 0940  PROBNP 12648.0*   CBG:  Recent Labs Lab 02/23/13 1921  GLUCAP 130*    Radiological Exams on Admission: Dg Chest 2 View  02/23/2013   CLINICAL DATA:  Cough.  Lethargy.  EXAM: CHEST  2 VIEW  COMPARISON:  PA and lateral chest 12/15/2012.  FINDINGS: Elevation of the right hemidiaphragm relative to the left is unchanged. Lungs are clear. Heart size is normal. No pneumothorax or pleural effusion. Degenerative change about the shoulders is noted.  IMPRESSION: No acute disease.  Stable  compared to prior exam.   Electronically Signed   By: Inge Rise M.D.   On: 02/23/2013 18:56   Ct Head Wo Contrast  02/23/2013   CLINICAL DATA:  Fatigue.  Weakness.  EXAM: CT HEAD WITHOUT CONTRAST  TECHNIQUE: Contiguous axial images were obtained from the base of the skull through the vertex without intravenous contrast.  COMPARISON:  Brain MRI 12/20/2011.  FINDINGS: Chronic microvascular ischemic change and cortical atrophy are again seen. There is no evidence of acute abnormality including infarct, hemorrhage, mass lesion, mass effect, midline shift or abnormal extra-axial fluid collection. There is no hydrocephalus or pneumocephalus. Mild mucosal thickening left maxillary sinus is noted. The calvarium is intact.  IMPRESSION: No acute finding. Atrophy and chronic microvascular ischemic change.   Electronically Signed   By: Inge Rise M.D.   On: 02/23/2013 20:01    EKG: Independently reviewed - normal sinus rhythm with T-wave changes comparable to the old EKG.  Assessment/Plan Principal Problem:   Acute encephalopathy Active Problems:   Diabetes mellitus   Fever   Nausea vomiting and diarrhea   1. Acute encephalopathy - most likely secondary to the fever. At this time patient has been empirically started vancomycin and cefepime. Since patient has difficulty abducting his right eye I have ordered MRI of the brain. I am holding patient's Neurontin for now. Patient's medication list also shows he was on Levaquin and Decadron but family states he is not on these medications. Closely observe status with neuro checks for now. Check ammonia levels. If mental status does not improve may need lumbar puncture. 2. Fever - source not clear. Could be from nausea vomiting and diarrhea. Patient's right foot does not look to have any active discharge. Urine shows possibility of UTI. CT abdomen and pelvis is pending. Blood cultures urine cultures stool studies including C. difficile and influenza PCR  has been ordered. Continue vancomycin and ceftriaxone for now. Check sedimentation rate. I have requested wound team consult to check on the right foot wound. 3. Nausea vomiting and diarrhea - probably viral. CT abdomen and pelvis is pending. AST is mildly elevated. Closely follow LFTs. 4. ESRD  on hemodialysis on Monday Wednesday Friday - patient's potassium was found to be high but EKG does not show any definite changes. Sample may be hemolyzed. I have ordered a repeat metabolic panel stat. 5. Diabetes mellitus type 2 - continue home medications with sliding-scale coverage. 6. Hypothyroidism - continue Synthroid. Check TSH.  I have reviewed patient's old charts and labs.  Code Status: Full code.  Family Communication: Family the bedside  Disposition Plan: Admit to inpatient.    Connery Shiffler N. [facility] Pager [phone].  If 7PM-7AM, please contact night-coverage [email or web address] Password [email or web address] 02/23/2013, 11:06 PM

## 2013-02-23 NOTE — ED Provider Notes (Signed)
CSN: [id number]     Arrival date & time 02/23/13  1746 History   First MD Initiated Contact with Patient 02/23/13 1750     Chief Complaint  Patient presents with  . Fatigue  . Code Sepsis     (Consider location/radiation/quality/duration/timing/severity/associated sxs/prior Treatment) HPI Comments: 67 year old male presents with 24 hours of vomiting, diarrhea and now altered mental status. Per the daughter he's also had cough. The patient has a history of dialysis and last had dialysis yesterday. The patient is unable to give a history due to his altered mental status. He is awake and sometimes respond to some questions with short answers. It seems that multiple family members have had "a stomach bug" and that he seems to gotten it. He had a cough yesterday and was noted to have a fever of over 101 at home today. Due to this with the altered mental status they brought him into the ER for evaluation. He has had a wound VAC on his right foot due to recent infection but it seems to be improving per the family and the wound care physician saw him recently.   Past Medical History  Diagnosis Date  . Hypertension   . Diabetes mellitus without complication   . Shortness of breath   . Pneumonia     2012  . Heart murmur   . Glaucoma   . Multiple myeloma, without mention of having achieved remission 03/30/2012    Cytogenetic neg on 03/23/2012.  . End stage renal disease on dialysis   . Asthma   . Hyperparathyroidism, secondary renal    Past Surgical History  Procedure Laterality Date  . Thyroidectomy    . Cervical disc surgery    . Eye surgery      CATARACTS  . Insertion of dialysis catheter Right 03/19/2012    Procedure: INSERTION OF DIALYSIS CATHETER;  Surgeon: Mal Misty, MD;  Location: [place];  Service: Vascular;  Laterality: Right;  Right Internal Jugular  . Av fistula placement Left 03/25/2012    Procedure: ARTERIOVENOUS (AV) FISTULA CREATION;  Surgeon: Mal Misty, MD;  Location:  [place];  Service: Vascular;  Laterality: Left;  . Ligation of competing branches of arteriovenous fistula Left 05/08/2012    Procedure: LIGATION OF COMPETING BRANCHES OF ARTERIOVENOUS FISTULA;  Surgeon: Mal Misty, MD;  Location: [place];  Service: Vascular;  Laterality: Left;  Ultrasound guided  . Cardiac catheterization      approx 30 years ago  . Amputation Right 11/10/2012    Procedure: AMPUTATION FIRST and SECOND TOES Right Foot;  Surgeon: Elam Dutch, MD;  Location: [facility] OR;  Service: Vascular;  Laterality: Right;  . Transmetatarsal amputation Left 12/16/2012    Procedure: TRANSMETATARSAL AMPUTATION AND VAC PLACEMENT;  Surgeon: Elam Dutch, MD;  Location: [facility] OR;  Service: Vascular;  Laterality: Left;   Family History  Problem Relation Age of Onset  . Diabetes Mother   . Cancer Mother     bone   . Kidney disease Father    History  Substance Use Topics  . Smoking status: Former Smoker    Types: Cigarettes    Quit date: 03/19/1982  . Smokeless tobacco: Never Used  . Alcohol Use: No     Comment: formerly    Review of Systems  Unable to perform ROS: Mental status change      Allergies  Lisinopril; Ivp dye; and Morphine and related  Home Medications   Current Outpatient Rx  Name  Route  Sig  Dispense  Refill  . aspirin EC 81 MG tablet   Oral   Take 81 mg by mouth daily.         Marland Kitchen b complex-vitamin c-folic acid (NEPHRO-VITE) 0.8 MG TABS tablet   Oral   Take 1 tablet by mouth daily.         Marland Kitchen BYSTOLIC 5 MG tablet   Oral   Take 5 mg by mouth daily.         . calcium acetate (PHOSLO) 667 MG capsule   Oral   Take 2,001 mg by mouth 3 (three) times daily with meals.         Marland Kitchen dexamethasone (DECADRON) 4 MG tablet               . doxazosin (CARDURA) 2 MG tablet   Oral   Take 2 mg by mouth at bedtime.         . gabapentin (NEURONTIN) 400 MG capsule   Oral   Take 400 mg by mouth at bedtime.         Marland Kitchen levofloxacin (LEVAQUIN) 500 MG tablet    Oral   Take 1 tablet (500 mg total) by mouth daily.   10 tablet   0   . levothyroxine (SYNTHROID, LEVOTHROID) 175 MCG tablet   Oral   Take 175 mcg by mouth every morning.          Marland Kitchen oxyCODONE (OXY IR/ROXICODONE) 5 MG immediate release tablet   Oral   Take 1 tablet (5 mg total) by mouth every 6 (six) hours as needed.   20 tablet   0   . oxyCODONE-acetaminophen (PERCOCET/ROXICET) 5-325 MG per tablet   Oral   Take 1 tablet by mouth every 4 (four) hours as needed for severe pain.   6 tablet   0   . saxagliptin HCl (ONGLYZA) 5 MG TABS tablet   Oral   Take 5 mg by mouth at bedtime.           There were no vitals taken for this visit. Physical Exam  Nursing note and vitals reviewed. Constitutional: He appears well-developed and well-nourished. No distress.  HENT:  Head: Normocephalic and atraumatic.  Right Ear: External ear normal.  Left Ear: External ear normal.  Nose: Nose normal.  Eyes: Right eye exhibits no discharge. Left eye exhibits no discharge.  Neck: Neck supple.  Cardiovascular: Normal rate, regular rhythm and intact distal pulses.   Murmur heard. Pulmonary/Chest: Effort normal. He has decreased breath sounds in the right lower field and the left lower field.  Abdominal: Soft. He exhibits no distension. There is no tenderness.  Musculoskeletal: He exhibits no edema.       Feet:  Neurological: He is alert. He is disoriented. GCS eye subscore is 4. GCS verbal subscore is 5. GCS motor subscore is 6.  Sleepy but awake. Grossly normal strength in all extremities  Skin: Skin is warm and dry.    ED Course  Procedures (including critical care time) Labs Review Labs Reviewed  CBC WITH DIFFERENTIAL - Abnormal; Notable for the following:    RDW 16.9 (*)    Neutrophils Relative % 87 (*)    Lymphocytes Relative 7 (*)    Lymphs Abs 0.5 (*)    All other components within normal limits  COMPREHENSIVE METABOLIC PANEL - Abnormal; Notable for the following:     Potassium 7.6 (*)    Glucose, Bld 138 (*)    BUN 25 (*)  Creatinine, Ser 6.34 (*)    Albumin 3.3 (*)    AST 106 (*)    GFR calc non Af Amer 8 (*)    GFR calc Af Amer 9 (*)    All other components within normal limits  URINALYSIS, ROUTINE W REFLEX MICROSCOPIC - Abnormal; Notable for the following:    APPearance CLOUDY (*)    Hgb urine dipstick LARGE (*)    Bilirubin Urine SMALL (*)    Protein, ur >300 (*)    Leukocytes, UA LARGE (*)    All other components within normal limits  GLUCOSE, CAPILLARY - Abnormal; Notable for the following:    Glucose-Capillary 130 (*)    All other components within normal limits  URINE MICROSCOPIC-ADD ON - Abnormal; Notable for the following:    Bacteria, UA FEW (*)    Casts HYALINE CASTS (*)    All other components within normal limits  POCT I-STAT, CHEM 8 - Abnormal; Notable for the following:    BUN 27 (*)    Creatinine, Ser 6.70 (*)    Glucose, Bld 144 (*)    All other components within normal limits  CULTURE, BLOOD (ROUTINE X 2)  CULTURE, BLOOD (ROUTINE X 2)  PROTIME-INR   Imaging Review Dg Chest 2 View  02/23/2013   CLINICAL DATA:  Cough.  Lethargy.  EXAM: CHEST  2 VIEW  COMPARISON:  PA and lateral chest 12/15/2012.  FINDINGS: Elevation of the right hemidiaphragm relative to the left is unchanged. Lungs are clear. Heart size is normal. No pneumothorax or pleural effusion. Degenerative change about the shoulders is noted.  IMPRESSION: No acute disease.  Stable compared to prior exam.   Electronically Signed   By: Inge Rise M.D.   On: 02/23/2013 18:56   Ct Head Wo Contrast  02/23/2013   CLINICAL DATA:  Fatigue.  Weakness.  EXAM: CT HEAD WITHOUT CONTRAST  TECHNIQUE: Contiguous axial images were obtained from the base of the skull through the vertex without intravenous contrast.  COMPARISON:  Brain MRI 12/20/2011.  FINDINGS: Chronic microvascular ischemic change and cortical atrophy are again seen. There is no evidence of acute abnormality  including infarct, hemorrhage, mass lesion, mass effect, midline shift or abnormal extra-axial fluid collection. There is no hydrocephalus or pneumocephalus. Mild mucosal thickening left maxillary sinus is noted. The calvarium is intact.  IMPRESSION: No acute finding. Atrophy and chronic microvascular ischemic change.   Electronically Signed   By: Inge Rise M.D.   On: 02/23/2013 20:01    EKG Interpretation    Date/Time:  Tuesday February 23 2013 18:17:00 EST Ventricular Rate:  74 PR Interval:  340 QRS Duration: 84 QT Interval:  400 QTC Calculation: 444 R Axis:   -71 Text Interpretation:  Sinus rhythm Prolonged PR interval Left anterior fascicular block Anterior infarct, old T wave inversion AVL is not new Confirmed by Collin Hendley  MD, Melitta Tigue ([id number]) on 02/23/2013 6:24:35 PM            MDM   Final diagnoses:  Fever  ESRD (end stage renal disease)    Patient's mental status improved with tylenol and fever control. Has leukocytes in urine, could be source. Given his mild AMS and patient being high risk for bacteremia (repeated dialysis) he will get blood cultures, broad abx and admit.     Ephraim Hamburger, MD 02/24/13 [phone]

## 2013-02-23 NOTE — ED Notes (Signed)
Spoke with ED Pharmacist about vancomycin dose in regards to renal function.  Safe to administer.

## 2013-02-23 NOTE — ED Notes (Signed)
MD at bedside. (Internal Medicine).

## 2013-02-24 ENCOUNTER — Inpatient Hospital Stay (HOSPITAL_COMMUNITY): Payer: Medicare Other

## 2013-02-24 DIAGNOSIS — N186 End stage renal disease: Secondary | ICD-10-CM

## 2013-02-24 DIAGNOSIS — I10 Essential (primary) hypertension: Secondary | ICD-10-CM

## 2013-02-24 LAB — AMMONIA: AMMONIA: 26 umol/L (ref 11–60)

## 2013-02-24 LAB — COMPREHENSIVE METABOLIC PANEL
ALK PHOS: 46 U/L (ref 39–117)
ALT: 10 U/L (ref 0–53)
AST: 13 U/L (ref 0–37)
Albumin: 2.8 g/dL — ABNORMAL LOW (ref 3.5–5.2)
BILIRUBIN TOTAL: 0.4 mg/dL (ref 0.3–1.2)
BUN: 31 mg/dL — ABNORMAL HIGH (ref 6–23)
CHLORIDE: 101 meq/L (ref 96–112)
CO2: 26 mEq/L (ref 19–32)
Calcium: 8.8 mg/dL (ref 8.4–10.5)
Creatinine, Ser: 7.56 mg/dL — ABNORMAL HIGH (ref 0.50–1.35)
GFR calc Af Amer: 8 mL/min — ABNORMAL LOW (ref >90)
GFR calc non Af Amer: 6 mL/min — ABNORMAL LOW (ref >90)
Glucose, Bld: 141 mg/dL — ABNORMAL HIGH (ref 70–99)
POTASSIUM: 4.8 meq/L (ref 3.7–5.3)
Sodium: 142 mEq/L (ref 137–147)
TOTAL PROTEIN: 6.7 g/dL (ref 6.0–8.3)

## 2013-02-24 LAB — MRSA PCR SCREENING: MRSA by PCR: NEGATIVE

## 2013-02-24 LAB — BASIC METABOLIC PANEL
BUN: 28 mg/dL — ABNORMAL HIGH (ref 6–23)
CALCIUM: 9 mg/dL (ref 8.4–10.5)
CO2: 24 mEq/L (ref 19–32)
Chloride: 100 mEq/L (ref 96–112)
Creatinine, Ser: 7.17 mg/dL — ABNORMAL HIGH (ref 0.50–1.35)
GFR, EST AFRICAN AMERICAN: 8 mL/min — AB (ref >90)
GFR, EST NON AFRICAN AMERICAN: 7 mL/min — AB (ref >90)
Glucose, Bld: 135 mg/dL — ABNORMAL HIGH (ref 70–99)
POTASSIUM: 4.7 meq/L (ref 3.7–5.3)
SODIUM: 141 meq/L (ref 137–147)

## 2013-02-24 LAB — CBC WITH DIFFERENTIAL/PLATELET
Basophils Absolute: 0 10*3/uL (ref 0.0–0.1)
Basophils Relative: 0 % (ref 0–1)
EOS ABS: 0 10*3/uL (ref 0.0–0.7)
EOS PCT: 0 % (ref 0–5)
HEMATOCRIT: 41.9 % (ref 39.0–52.0)
Hemoglobin: 12.7 g/dL — ABNORMAL LOW (ref 13.0–17.0)
Lymphocytes Relative: 11 % — ABNORMAL LOW (ref 12–46)
Lymphs Abs: 0.6 10*3/uL — ABNORMAL LOW (ref 0.7–4.0)
MCH: 26.6 pg (ref 26.0–34.0)
MCHC: 30.3 g/dL (ref 30.0–36.0)
MCV: 87.8 fL (ref 78.0–100.0)
MONOS PCT: 4 % (ref 3–12)
Monocytes Absolute: 0.2 10*3/uL (ref 0.1–1.0)
Neutro Abs: 4.6 10*3/uL (ref 1.7–7.7)
Neutrophils Relative %: 84 % — ABNORMAL HIGH (ref 43–77)
PLATELETS: 155 10*3/uL (ref 150–400)
RBC: 4.77 MIL/uL (ref 4.22–5.81)
RDW: 17 % — ABNORMAL HIGH (ref 11.5–15.5)
WBC: 5.5 10*3/uL (ref 4.0–10.5)

## 2013-02-24 LAB — GLUCOSE, CAPILLARY
GLUCOSE-CAPILLARY: 92 mg/dL (ref 70–99)
Glucose-Capillary: 130 mg/dL — ABNORMAL HIGH (ref 70–99)
Glucose-Capillary: 152 mg/dL — ABNORMAL HIGH (ref 70–99)

## 2013-02-24 LAB — INFLUENZA PANEL BY PCR (TYPE A & B)
H1N1 flu by pcr: NOT DETECTED
INFLAPCR: NEGATIVE
Influenza B By PCR: NEGATIVE

## 2013-02-24 LAB — SEDIMENTATION RATE: Sed Rate: 20 mm/hr — ABNORMAL HIGH (ref 0–16)

## 2013-02-24 LAB — TSH: TSH: 0.917 u[IU]/mL (ref 0.350–4.500)

## 2013-02-24 MED ORDER — VANCOMYCIN HCL IN DEXTROSE 1-5 GM/200ML-% IV SOLN
1000.0000 mg | INTRAVENOUS | Status: DC
  Administered 2013-02-24 – 2013-02-26 (×2): 1000 mg via INTRAVENOUS
  Filled 2013-02-24 (×4): qty 200

## 2013-02-24 MED ORDER — SODIUM CHLORIDE 0.9 % IV SOLN
125.0000 mg | INTRAVENOUS | Status: DC
  Administered 2013-02-24 – 2013-02-26 (×3): 125 mg via INTRAVENOUS
  Filled 2013-02-24 (×4): qty 10

## 2013-02-24 MED ORDER — RENA-VITE PO TABS
1.0000 | ORAL_TABLET | Freq: Every day | ORAL | Status: DC
  Administered 2013-02-24: 1 via ORAL
  Administered 2013-02-25: 21:00:00 via ORAL
  Administered 2013-02-26: 1 via ORAL
  Filled 2013-02-24 (×4): qty 1

## 2013-02-24 MED ORDER — NEPRO/CARBSTEADY PO LIQD
237.0000 mL | Freq: Two times a day (BID) | ORAL | Status: DC
  Administered 2013-02-24 – 2013-02-27 (×4): 237 mL via ORAL

## 2013-02-24 NOTE — Consult Note (Signed)
[facility] Renal Consultation Note    Indication for Consultation:  Management of ESRD/hemodialysis; anemia, hypertension/volume and secondary hyperparathyroidism  HPI: Jack [name] is a 68 y.o. male ESRD patient (MWF HD) with multiple medical problems who was transported to the ED via EMS after patient noted him becoming progressively fatigued with fever, nausea, vomiting, chills and change in mental status. Sick contacts were in the household, so it was first presumed that he caught what the others has contracted. One he became altered, they were prompted to seek evaluation. At the time of this encounter, he afebrile and resting in bed and complains of a cramp in his left hand which he has had in the past.  His mentation is extremely slow compared to his baseline and he is having trouble word finding. I ask about his GI disturbance and he indicates that he has not vomited or had an episode of diarrhea today. His brother is present and indicated that he in fact has had both today. The source of the fever and encephalopathy is unclear thus far. CT head, abd and pelvis are all unremarkable. MRI brain was ordered to evaluate adduction of his right eye but was without any acute findings. Cultures are still pending.   Past Medical History  Diagnosis Date  . Hypertension   . Diabetes mellitus without complication   . Shortness of breath   . Pneumonia     2012  . Heart murmur   . Glaucoma   . Multiple myeloma, without mention of having achieved remission 03/30/2012    Cytogenetic neg on 03/23/2012.  . End stage renal disease on dialysis   . Asthma   . Hyperparathyroidism, secondary renal    Past Surgical History  Procedure Laterality Date  . Thyroidectomy    . Cervical disc surgery    . Eye surgery      CATARACTS  . Insertion of dialysis catheter Right 03/19/2012    Procedure: INSERTION OF DIALYSIS CATHETER;  Surgeon: Mal Misty, MD;  Location: [place];  Service:  Vascular;  Laterality: Right;  Right Internal Jugular  . Av fistula placement Left 03/25/2012    Procedure: ARTERIOVENOUS (AV) FISTULA CREATION;  Surgeon: Mal Misty, MD;  Location: [place];  Service: Vascular;  Laterality: Left;  . Ligation of competing branches of arteriovenous fistula Left 05/08/2012    Procedure: LIGATION OF COMPETING BRANCHES OF ARTERIOVENOUS FISTULA;  Surgeon: Mal Misty, MD;  Location: [place];  Service: Vascular;  Laterality: Left;  Ultrasound guided  . Cardiac catheterization      approx 30 years ago  . Amputation Right 11/10/2012    Procedure: AMPUTATION FIRST and SECOND TOES Right Foot;  Surgeon: Elam Dutch, MD;  Location: [facility] OR;  Service: Vascular;  Laterality: Right;  . Transmetatarsal amputation Left 12/16/2012    Procedure: TRANSMETATARSAL AMPUTATION AND VAC PLACEMENT;  Surgeon: Elam Dutch, MD;  Location: [facility] OR;  Service: Vascular;  Laterality: Left;   Family History  Problem Relation Age of Onset  . Diabetes Mother   . Cancer Mother     bone   . Kidney disease Father    Social History:  reports that he quit smoking about 30 years ago. His smoking use included Cigarettes. He smoked 0.00 packs per day. He has never used smokeless tobacco. He reports that he does not drink alcohol or use illicit drugs. Allergies  Allergen Reactions  . Lisinopril Cough  . Ivp Dye [Iodinated Diagnostic Agents] Hives  .  Morphine And Related Other (See Comments)    Bradycardia states patient   Prior to Admission medications   Medication Sig Start Date End Date Taking? Authorizing Provider  aspirin EC 81 MG tablet Take 81 mg by mouth daily.   Yes Historical Provider, MD  b complex-vitamin c-folic acid (NEPHRO-VITE) 0.8 MG TABS tablet Take 1 tablet by mouth daily.   Yes Historical Provider, MD  BYSTOLIC 5 MG tablet Take 5 mg by mouth daily. 09/09/12  Yes Historical Provider, MD  calcium acetate (PHOSLO) 667 MG capsule Take 2,001 mg by mouth 3 (three) times daily  with meals.   Yes Historical Provider, MD  dexamethasone (DECADRON) 4 MG tablet  12/30/12  Yes Historical Provider, MD  doxazosin (CARDURA) 2 MG tablet Take 2 mg by mouth at bedtime.   Yes Historical Provider, MD  gabapentin (NEURONTIN) 400 MG capsule Take 400 mg by mouth at bedtime.   Yes Historical Provider, MD  levofloxacin (LEVAQUIN) 500 MG tablet Take 1 tablet (500 mg total) by mouth daily. 01/11/13  Yes Suzanne L Nickel, NP  levothyroxine (SYNTHROID, LEVOTHROID) 175 MCG tablet Take 175 mcg by mouth every morning.    Yes Historical Provider, MD  oxyCODONE (OXY IR/ROXICODONE) 5 MG immediate release tablet Take 5 mg by mouth every 6 (six) hours as needed for moderate pain. 01/11/13  Yes Sharmon Leyden Nickel, NP  SANTYL ointment Apply 1 application topically daily.  02/22/13  Yes Historical Provider, MD  saxagliptin HCl (ONGLYZA) 5 MG TABS tablet Take 5 mg by mouth at bedtime.    Yes Historical Provider, MD   Current Facility-Administered Medications  Medication Dose Route Frequency Provider Last Rate Last Dose  . acetaminophen (TYLENOL) tablet 650 mg  650 mg Oral Q4H PRN Rise Patience, MD   650 mg at 02/24/13 1104  . aspirin EC tablet 81 mg  81 mg Oral Daily Rise Patience, MD      . calcium acetate (PHOSLO) capsule 2,001 mg  2,001 mg Oral TID WC Rise Patience, MD   2,001 mg at 02/24/13 1238  . ceFEPIme (MAXIPIME) 2 g in dextrose 5 % 50 mL IVPB  2 g Intravenous Q M,W,F-HD Rise Patience, MD   2 g at 02/24/13 0046  . doxazosin (CARDURA) tablet 2 mg  2 mg Oral QHS Rise Patience, MD      . insulin aspart (novoLOG) injection 0-9 Units  0-9 Units Subcutaneous TID WC Rise Patience, MD   1 Units at 02/24/13 1239  . levothyroxine (SYNTHROID, LEVOTHROID) tablet 175 mcg  175 mcg Oral q morning - 10a Rise Patience, MD   175 mcg at 02/24/13 1239  . linagliptin (TRADJENTA) tablet 5 mg  5 mg Oral Daily Rise Patience, MD      . nebivolol (BYSTOLIC) tablet 5 mg  5 mg  Oral Daily Rise Patience, MD      . oxyCODONE (Oxy IR/ROXICODONE) immediate release tablet 5 mg  5 mg Oral Q6H PRN Rise Patience, MD      . vancomycin (VANCOCIN) IVPB 1000 mg/200 mL premix  1,000 mg Intravenous Q M,W,F-HD Rande Lawman [place], [facility]       Labs: Basic Metabolic Panel:  Recent Labs Lab 02/23/13 1808 02/23/13 1852 02/24/13 0130 02/24/13 0425  NA 139 141 141 142  K 7.6* 4.6 4.7 4.8  CL 98 102 100 101  CO2 24  --  24 26  GLUCOSE 138* 144* 135* 141*  BUN 25* 27*  28* 31*  CREATININE 6.34* 6.70* 7.17* 7.56*  CALCIUM 9.3  --  9.0 8.8   Liver Function Tests:  Recent Labs Lab 02/23/13 1808 02/24/13 0425  AST 106* 13  ALT 21 10  ALKPHOS 40 46  BILITOT 0.4 0.4  PROT 8.1 6.7  ALBUMIN 3.3* 2.8*    CBC:  Recent Labs Lab 02/23/13 1852 02/23/13 1931 02/24/13 0130  WBC  --  7.5 5.5  NEUTROABS  --  6.5 4.6  HGB 17.0 13.9 12.7*  HCT 50.0 44.8 41.9  MCV  --  87.2 87.8  PLT  --  190 155    CBG:  Recent Labs Lab 02/23/13 1921 02/23/13 2329 02/24/13 1214  GLUCAP 130* 117* 130*   Studies/Results: Ct Abdomen Pelvis Wo Contrast  02/24/2013   CLINICAL DATA:  Vomiting, diarrhea, febrile, end-stage renal disease on dialysis, contrast allergy, hypertension, diabetes, multiple myeloma, asthma  EXAM: CT ABDOMEN AND PELVIS WITHOUT CONTRAST  TECHNIQUE: Multidetector CT imaging of the abdomen and pelvis was performed following the standard protocol without intravenous contrast. Oral contrast not administered. Sagittal and coronal MPR images reconstructed from axial data set.  COMPARISON:  01/03/2009  FINDINGS: Scattered atherosclerotic calcifications aorta and coronary arteries.  Right basilar atelectasis.  Tiny exophytic nodule question cyst at upper pole left kidney 11 mm diameter image 42 little changed.  Within limits of a nonenhanced exam, no focal abnormalities of the liver, spleen, atrophic pancreas, kidneys, or adrenal glands.  Cardiac chambers appear  mildly enlarged.  Normal appendix.  Thickened bladder wall with a 13 mm diameter vesicular calculus and potentially a several tiny additional calculi.  Significant prostate enlargement, 6.1 x 5.3 cm image 88.  Scattered stool throughout colon.  Bowel loops otherwise unremarkable for technique.  Stomach decompressed, unable to accurately assess wall thickness.  No mass, adenopathy, free fluid or inflammatory process.  Elevation versus eventration of the right diaphragm.  Bones demineralized without focal abnormality.  IMPRESSION: Bladder calculi largest 13 mm diameter with associated wall thickening.  Coexistent prostate enlargement question chronic outlet obstruction.  Right basilar atelectasis.  No other intra-abdominal or intrapelvic abnormalities identified.   Electronically Signed   By: Lavonia Dana M.D.   On: 02/24/2013 10:18   Dg Chest 2 View  02/23/2013   CLINICAL DATA:  Cough.  Lethargy.  EXAM: CHEST  2 VIEW  COMPARISON:  PA and lateral chest 12/15/2012.  FINDINGS: Elevation of the right hemidiaphragm relative to the left is unchanged. Lungs are clear. Heart size is normal. No pneumothorax or pleural effusion. Degenerative change about the shoulders is noted.  IMPRESSION: No acute disease.  Stable compared to prior exam.   Electronically Signed   By: Inge Rise M.D.   On: 02/23/2013 18:56   Ct Head Wo Contrast  02/23/2013   CLINICAL DATA:  Fatigue.  Weakness.  EXAM: CT HEAD WITHOUT CONTRAST  TECHNIQUE: Contiguous axial images were obtained from the base of the skull through the vertex without intravenous contrast.  COMPARISON:  Brain MRI 12/20/2011.  FINDINGS: Chronic microvascular ischemic change and cortical atrophy are again seen. There is no evidence of acute abnormality including infarct, hemorrhage, mass lesion, mass effect, midline shift or abnormal extra-axial fluid collection. There is no hydrocephalus or pneumocephalus. Mild mucosal thickening left maxillary sinus is noted. The calvarium  is intact.  IMPRESSION: No acute finding. Atrophy and chronic microvascular ischemic change.   Electronically Signed   By: Inge Rise M.D.   On: 02/23/2013 20:01   Mri Brain Without  Contrast  02/24/2013   CLINICAL DATA:  Altered mental status. Confusion. End-stage renal disease. Suspected fever related encephalopathy. Diabetes mellitus and hypertension.  EXAM: MRI HEAD WITHOUT CONTRAST  TECHNIQUE: Multiplanar, multiecho pulse sequences of the brain and surrounding structures were obtained without intravenous contrast.  COMPARISON:  CT head 02/23/2013.  MR head 12/20/2011.  FINDINGS: The patient was unable to remain motionless for the exam. Small or subtle lesions could be overlooked.  No evidence for acute infarction, hemorrhage, mass lesion, hydrocephalus, or extra-axial fluid. Moderate cerebral and cerebellar atrophy. Mild to moderate subcortical and periventricular T2 and FLAIR hyperintensities, likely chronic microvascular ischemic change. Flow voids are maintained throughout the carotid, basilar, and vertebral arteries. Tiny focus of chronic hemorrhage right parietal subcortical white matter (image 138 series 8) likely related to chronic hypertension. Pituitary and cerebellar tonsils unremarkable.  In the upper cervical region there is image distortion due to hardware related to previous C3-C4 fusion. There is adjacent segment disc space narrowing at C2-C3 with moderate pannus surrounding the odontoid. The marrow signal in the C2 vertebral body is diffusely abnormal which may be due to an inflammatory process. Comparison with the prior MR showed a similar appearance to the C2 vertebral body and C2-3 disc space, but some progression of pannus and abnormal marrow signal (edema) since 2013 is suspected on today's exam. In addition, there appears to be osseous spurring at C2-3 which compresses the upper cervical cord. Consider MRI of the cervical spine for further evaluation if there are signs and symptoms  of myelopathy.  Calvarium and skullbase grossly unremarkable. Stable scalp lipomas in the occipital region. Bilateral proptosis due to increased orbital fat. Bilateral cataract extraction. Right middle and inferior nasal turbinate hypertrophy with slight fluid layering in the right greater than left maxillary sinuses.  Compared with 2013, similar degree of atrophy and small vessel disease was noted.  IMPRESSION: No acute stroke or hemorrhage.  Moderate atrophy and chronic microvascular ischemic change, stable from 2013.  Spondylosis at C2-3 related to prior C3-C4 fusion along with pannus and abnormal marrow signal in the C2 vertebral body, likely inflammatory. Consider cervical spine MRI without contrast if there signs and symptoms referable to this region.   Electronically Signed   By: Rolla Flatten M.D.   On: 02/24/2013 08:32    ROS: Left hand cramp .10 pt ROS attempted. Unable to evaluate fully due to patients mental status   Physical Exam: Filed Vitals:   02/23/13 2314 02/23/13 2324 02/24/13 0330 02/24/13 0900  BP: 138/64  132/59 135/68  Pulse: 67  65 56  Temp: 99.7 F (37.6 C)  100 F (37.8 C) 98.7 F (37.1 C)  TempSrc: Oral  Oral Axillary  Resp: '18  18 18  ' Height:  5' 10.08" (1.78 m)    Weight:  90 kg (198 lb 6.6 oz)    SpO2: 98%  96% 94%     General: Well developed, well nourished, pleasantly confused, in no acute distress. Head: Normocephalic, atraumatic, sclera non-icteric, mucus membranes are moist. Noted right eye adduction Neck: Supple. JVD not elevated. Lungs:  Decreased/poor effort. No appreciable wheezes, rales, or rhonchi. Breathing is unlabored. Heart: RRR 2/6 murmur LUSB, no rubs or gallops appreciated. Abdomen: Soft, non-tender, non-distended with normoactive bowel sounds. No rebound/guarding. No obvious abdominal masses. M-S:  Strength and tone appear normal for age. Lower extremities: RLE transmet amp with wound vac, No LE edema,  Neuro: Altered. Moves all extremities  spontaneously. Psych:  Responds to questions appropriately with a normal  affect, trouble word finding Dialysis Access: L AVF + bruit/no evidence of infection  Dialysis Orders: Center: [facility]  on MWF . EDW 87.5kg HD Bath 2K2.25Ca  Time 4:00 Heparin 8700. Access L AVF BFR 400 DFR 800   Hectorol 1 mcg IV/HD Epogen 6000   Units IV/HD  Venofer  100 mg IV/HD load in progress (8 doses remain)  Recent labs: Hgb 11.4, Tsat 18%, P 4.7, PTH 125, Alb 3.7  Assessment/Plan: 1. Acute encephalopathy - Mgmt per primary. Presumed secondary to FUO, Also with difficulty abducting right eye. (had laser surgery last week) MRI brain planned. Neurontin held. Possible LP if mental status does not improve. 2. Fever - Mgmt per primary. Several sources considered (Viral vs UTI vs R foot wound) CT abd/pelvis shows nothing acute. On Vanc and ceftriaxone. Flu PCR neg, Blood/urine cultures, C diff PCR pending. 3. RLE wound - s/p transmet amp 12/3. WOC has seen and signed off. No infxn noted. 4. ESRD - MWF, K+ 4.8, HD later today 5. Hypertension/volume  - SBPs 268T on Bystolic and Cardura. CXR clear. Up ~2.5kg by wgts. UF to EDW today.  6. Anemia  - Hgb 12.7. Hold ESAs. Continue op Fe load. Follow CBC 7. Metabolic bone disease -  Ca 8.8 (9.8 corrected) Phos controlled. PTH within range. Continue Hectorol 1 and Phoslo 3.  8. Nutrition - Alb 2.8. Renal diet, multivitamin, Add ONS 9. DM - Mgmt per primary   Sonnie Alamo, PA-C [facility] Pager [phone] 02/24/2013, 1:02 PM   I have seen and examined patient, discussed with PA and agree with assessment and plan as outlined above. [name] MD pager [phone]    cell [phone] 02/24/2013, 4:47 PM

## 2013-02-24 NOTE — Evaluation (Signed)
Clinical/Bedside Swallow Evaluation Patient Details  Name: Jack [name] MRN: [id number] Date of Birth: 1945/05/19  Today's Date: 02/24/2013 Time: 1130-1210 SLP Time Calculation (min): 40 min  Past Medical History:  Past Medical History  Diagnosis Date  . Hypertension   . Diabetes mellitus without complication   . Shortness of breath   . Pneumonia     2012  . Heart murmur   . Glaucoma   . Multiple myeloma, without mention of having achieved remission 03/30/2012    Cytogenetic neg on 03/23/2012.  . End stage renal disease on dialysis   . Asthma   . Hyperparathyroidism, secondary renal    Past Surgical History:  Past Surgical History  Procedure Laterality Date  . Thyroidectomy    . Cervical disc surgery    . Eye surgery      CATARACTS  . Insertion of dialysis catheter Right 03/19/2012    Procedure: INSERTION OF DIALYSIS CATHETER;  Surgeon: Mal Misty, MD;  Location: [place];  Service: Vascular;  Laterality: Right;  Right Internal Jugular  . Av fistula placement Left 03/25/2012    Procedure: ARTERIOVENOUS (AV) FISTULA CREATION;  Surgeon: Mal Misty, MD;  Location: [place];  Service: Vascular;  Laterality: Left;  . Ligation of competing branches of arteriovenous fistula Left 05/08/2012    Procedure: LIGATION OF COMPETING BRANCHES OF ARTERIOVENOUS FISTULA;  Surgeon: Mal Misty, MD;  Location: [place];  Service: Vascular;  Laterality: Left;  Ultrasound guided  . Cardiac catheterization      approx 30 years ago  . Amputation Right 11/10/2012    Procedure: AMPUTATION FIRST and SECOND TOES Right Foot;  Surgeon: Elam Dutch, MD;  Location: [place];  Service: Vascular;  Laterality: Right;  . Transmetatarsal amputation Left 12/16/2012    Procedure: TRANSMETATARSAL AMPUTATION AND VAC PLACEMENT;  Surgeon: Elam Dutch, MD;  Location: [place];  Service: Vascular;  Laterality: Left;   HPI:  Jack [name] is a 68 y.o. male with known history of ESRD hemodialysis Monday  Wednesday Friday, recent admitted for acute osteomyelitis of the right foot and has had transmetatarsal amputation in December and has had wound VAC placed since then was found to be confused by the family later this evening. Patient had also had nausea vomiting and diarrhea. Patient's family states that patient was doing fine in the morning later when they came in the evening patient was found with vomitus and diarrhea all over. Patient was found to be febrile in the ER. CT head did not show anything acute. Patient's mental status is slowly improved at this time and has periods when he gets confused. Chest x-ray was unremarkable. UA shows possibility of UTI. CT abdomen and pelvis is pending but abdomen appears benign on exam. On exam patient is a right eye does not abduct completely. Patient's family does not know if this is a new finding. Patient had a recent laser eye surgery on the right eye 2 weeks ago. Patient otherwise did not complain of any chest pain or shortness of breath. On exam patient is able to move all extremities but mildly weak on the left upper extremity. Patient has been in pretty started on vancomycin and cefepime. Blood cultures and urine cultures have been ordered. Family states that others in the family who is having nausea vomiting and diarrhea last few days.   Assessment / Plan / Recommendation Clinical Impression  Patient was initially groggy and not following commands or answering questions verbally.  Eyes were  crossed when patient attempted to focus on this examiner.  No s/s of dysphagia or aspiration were noted at bedside, despite pt's confusion.  Pt. did have difficulty feeding himself and attempted to place unopened crackers in mouth.  His mental status appeared to improve somewhat as the eval progressed.    Aspiration Risk  Mild    Diet Recommendation Regular;Thin liquid   Liquid Administration via: Cup;Straw Medication Administration: Whole meds with  liquid Supervision: Staff to assist with self feeding Compensations: Slow rate;Small sips/bites Postural Changes and/or Swallow Maneuvers: Seated upright 90 degrees;Upright 30-60 min after meal    Other  Recommendations Oral Care Recommendations: Oral care BID Other Recommendations: Clarify dietary restrictions (Renal restrictions)   Follow Up Recommendations  24 hour supervision/assistance    Frequency and Duration min 1 x/week  1 week   Pertinent Vitals/Pain n/a    SLP Swallow Goals  see care plan   Swallow Study Prior Functional Status       General HPI: Jack [name] is a 68 y.o. male with known history of ESRD hemodialysis Monday Wednesday Friday, recent admitted for acute osteomyelitis of the right foot and has had transmetatarsal amputation in December and has had wound VAC placed since then was found to be confused by the family later this evening. Patient had also had nausea vomiting and diarrhea. Patient's family states that patient was doing fine in the morning later when they came in the evening patient was found with vomitus and diarrhea all over. Patient was found to be febrile in the ER. CT head did not show anything acute. Patient's mental status is slowly improved at this time and has periods when he gets confused. Chest x-ray was unremarkable. UA shows possibility of UTI. CT abdomen and pelvis is pending but abdomen appears benign on exam. On exam patient is a right eye does not abduct completely. Patient's family does not know if this is a new finding. Patient had a recent laser eye surgery on the right eye 2 weeks ago. Patient otherwise did not complain of any chest pain or shortness of breath. On exam patient is able to move all extremities but mildly weak on the left upper extremity. Patient has been in pretty started on vancomycin and cefepime. Blood cultures and urine cultures have been ordered. Family states that others in the family who is having nausea vomiting  and diarrhea last few days. Type of Study: Bedside swallow evaluation Previous Swallow Assessment: none Diet Prior to this Study: Regular;Thin liquids (Renal restrictions) Temperature Spikes Noted: Yes Respiratory Status: Room air History of Recent Intubation: No Behavior/Cognition: Alert;Distractible;Requires cueing;Doesn't follow directions;Decreased sustained attention Oral Cavity - Dentition: Adequate natural dentition;Missing dentition Self-Feeding Abilities: Needs assist Patient Positioning: Upright in bed Baseline Vocal Quality: Clear;Low vocal intensity Volitional Cough: Strong Volitional Swallow: Unable to elicit    Oral/Motor/Sensory Function Overall Oral Motor/Sensory Function: Appears within functional limits for tasks assessed   Ice Chips Ice chips: Within functional limits Presentation: Spoon   Thin Liquid Thin Liquid: Within functional limits Presentation: Cup;Straw    Nectar Thick Nectar Thick Liquid: Not tested   Honey Thick Honey Thick Liquid: Not tested   Puree Puree: Within functional limits Presentation: Self Fed   Solid   GO    Solid: Within functional limits Presentation: Self Loyal Buba, Tavari Loadholt T 02/24/2013,12:56 PM

## 2013-02-24 NOTE — Progress Notes (Signed)
New Admission Note:  Arrival Method: Nurse tech brought patient in on a bed Mental Orientation: arousable Telemetry: placed on box 22, notified CCMD Assessment: Completed Skin: Right foot amputation, personal wound vac in place Pain: denies any pain Safety Measures: Safety Fall Prevention Plan was given, discussed and signed. Admission: Patient's daughter available to provide information [address] [place] Orientation: Patient has been orientated to the room, unit and the staff. Family: Daughter at bedside during admission. Orders have been reviewed and implemented. Will continue to monitor the patient. Call light as been placed within reach and bed alarm has been activated.   Leandro Reasoner BSN, RN  Phone Number: [phone] [facility]

## 2013-02-24 NOTE — Progress Notes (Signed)
   CARE MANAGEMENT NOTE 02/24/2013  Patient:  Jack [name], Jack [name]   Account Number:  [id number]  Date Initiated:  02/24/2013  Documentation initiated by:  Lizabeth Leyden  Subjective/Objective Assessment:   admitted with AMS, fever, s/p right foot transmetatarsal amputation 12/17/2012.  dc'd home with home care, wound VAC  ESRD/HD on MWF     Action/Plan:   resumption of home health services  [facility]/ RN, PT,OT  wound VAC   Anticipated DC Date:  02/27/2013   Anticipated DC Plan:  [place]  CM consult      [facility]  Resumption Of Svcs/PTA Provider   Choice offered to / List presented to:             Status of service:  In process, will continue to follow Medicare Important Message given?   (If response is "NO", the following Medicare IM given date fields will be blank) Date Medicare IM given:   Date Additional Medicare IM given:    Discharge Disposition:    Per UR Regulation:    If discussed at Long Length of Stay Meetings, dates discussed:    Comments:  02/25/2103  Jack [name], Jack [name]  Patient active with [facility] for RN, PT, OT. Discharged 12/17/2012 s/p trans metatarsal amputation of right foot, wound VAC to site.  [facility] called to advise of admission [facility]  [phone] called regarding admission with the wound VAC.  Advised NCM the VAC can be reapplied at time of discharge.

## 2013-02-24 NOTE — Progress Notes (Signed)
[facility]  Patient Status: Active (receiving services up to time of hospitalization)  [facility] is providing the following services: RN  If patient discharges after hours, please call [phone].   Jack [name] 02/24/2013, 10:42 AM

## 2013-02-24 NOTE — Progress Notes (Signed)
[facility] PROGRESS NOTE  Jack [name] AVW:[id number] DOB: 1945/09/30 DOA: 02/23/2013 PCP: [name], MD  Assessment/Plan: Acute toxic encephalopathy  -CT scan and MRI of brain negative for acute findings -Resolved, likely secondary to febrile illness Active Problems:  Fever/febrile illness  -Unclear etiology, influenza panel negative -Continue empiric antibiotics pending blood and urine cultures -Await C. difficile Diabetes mellitus  -Continue Trajenta Linagliptin and SSI Nausea vomiting and diarrhea/probable gastroenteritis -Likely secondary to viral etiology -CT of abdomen and pelvis with no acute findings -Await C. Difficile End-stage renal disease -Renal consulted, Monday Wednesday Friday dialysis-per renal Code Status: Full Family Communication: Daughters at the bedside Disposition Plan: To home when medically ready   Consultants:  Renal  Procedures:  None  Antibiotics:  Vancomycin and Zosyn started on 2/10  HPI/Subjective: Patient alert, tolerating by mouth well. Denies diarrhea no nausea vomiting  Objective: Filed Vitals:   02/24/13 1815  BP: 124/64  Pulse: 61  Temp:   Resp:     Intake/Output Summary (Last 24 hours) at 02/24/13 1904 Last data filed at 02/24/13 0400  Gross per 24 hour  Intake     50 ml  Output     20 ml  Net     30 ml   Filed Weights   02/23/13 2324 02/24/13 1813  Weight: 90 kg (198 lb 6.6 oz) 88.2 kg (194 lb 7.1 oz)    Exam:  General: alert & oriented x 3 In NAD Cardiovascular: RRR, nl S1 s2 Respiratory: CTAB Abdomen: soft +BS NT/ND, no masses palpable Extremities: No cyanosis and no edema     Data Reviewed: Basic Metabolic Panel:  Recent Labs Lab 02/23/13 1808 02/23/13 1852 02/24/13 0130 02/24/13 0425  NA 139 141 141 142  K 7.6* 4.6 4.7 4.8  CL 98 102 100 101  CO2 24  --  24 26  GLUCOSE 138* 144* 135* 141*  BUN 25* 27* 28* 31*  CREATININE 6.34* 6.70* 7.17* 7.56*  CALCIUM 9.3  --  9.0 8.8    Liver Function Tests:  Recent Labs Lab 02/23/13 1808 02/24/13 0425  AST 106* 13  ALT 21 10  ALKPHOS 40 46  BILITOT 0.4 0.4  PROT 8.1 6.7  ALBUMIN 3.3* 2.8*   No results found for this basename: LIPASE, AMYLASE,  in the last 168 hours  Recent Labs Lab 02/24/13 0130  AMMONIA 26   CBC:  Recent Labs Lab 02/23/13 1852 02/23/13 1931 02/24/13 0130  WBC  --  7.5 5.5  NEUTROABS  --  6.5 4.6  HGB 17.0 13.9 12.7*  HCT 50.0 44.8 41.9  MCV  --  87.2 87.8  PLT  --  190 155   Cardiac Enzymes: No results found for this basename: CKTOTAL, CKMB, CKMBINDEX, TROPONINI,  in the last 168 hours BNP (last 3 results)  Recent Labs  03/18/12 0940  PROBNP 12648.0*   CBG:  Recent Labs Lab 02/23/13 1921 02/23/13 2329 02/24/13 1214 02/24/13 1701  GLUCAP 130* 117* 130* 92    Recent Results (from the past 240 hour(s))  MRSA PCR SCREENING     Status: None   Collection Time    02/24/13  4:17 AM      Result Value Ref Range Status   MRSA by PCR NEGATIVE  NEGATIVE Final   Comment:            The GeneXpert MRSA Assay (FDA     approved for NASAL specimens     only), is one component of a  comprehensive MRSA colonization     surveillance program. It is not     intended to diagnose MRSA     infection nor to guide or     monitor treatment for     MRSA infections.     Studies: Ct Abdomen Pelvis Wo Contrast  02/24/2013   CLINICAL DATA:  Vomiting, diarrhea, febrile, end-stage renal disease on dialysis, contrast allergy, hypertension, diabetes, multiple myeloma, asthma  EXAM: CT ABDOMEN AND PELVIS WITHOUT CONTRAST  TECHNIQUE: Multidetector CT imaging of the abdomen and pelvis was performed following the standard protocol without intravenous contrast. Oral contrast not administered. Sagittal and coronal MPR images reconstructed from axial data set.  COMPARISON:  01/03/2009  FINDINGS: Scattered atherosclerotic calcifications aorta and coronary arteries.  Right basilar atelectasis.   Tiny exophytic nodule question cyst at upper pole left kidney 11 mm diameter image 42 little changed.  Within limits of a nonenhanced exam, no focal abnormalities of the liver, spleen, atrophic pancreas, kidneys, or adrenal glands.  Cardiac chambers appear mildly enlarged.  Normal appendix.  Thickened bladder wall with a 13 mm diameter vesicular calculus and potentially a several tiny additional calculi.  Significant prostate enlargement, 6.1 x 5.3 cm image 88.  Scattered stool throughout colon.  Bowel loops otherwise unremarkable for technique.  Stomach decompressed, unable to accurately assess wall thickness.  No mass, adenopathy, free fluid or inflammatory process.  Elevation versus eventration of the right diaphragm.  Bones demineralized without focal abnormality.  IMPRESSION: Bladder calculi largest 13 mm diameter with associated wall thickening.  Coexistent prostate enlargement question chronic outlet obstruction.  Right basilar atelectasis.  No other intra-abdominal or intrapelvic abnormalities identified.   Electronically Signed   By: Lavonia Dana M.D.   On: 02/24/2013 10:18   Dg Chest 2 View  02/23/2013   CLINICAL DATA:  Cough.  Lethargy.  EXAM: CHEST  2 VIEW  COMPARISON:  PA and lateral chest 12/15/2012.  FINDINGS: Elevation of the right hemidiaphragm relative to the left is unchanged. Lungs are clear. Heart size is normal. No pneumothorax or pleural effusion. Degenerative change about the shoulders is noted.  IMPRESSION: No acute disease.  Stable compared to prior exam.   Electronically Signed   By: Inge Rise M.D.   On: 02/23/2013 18:56   Ct Head Wo Contrast  02/23/2013   CLINICAL DATA:  Fatigue.  Weakness.  EXAM: CT HEAD WITHOUT CONTRAST  TECHNIQUE: Contiguous axial images were obtained from the base of the skull through the vertex without intravenous contrast.  COMPARISON:  Brain MRI 12/20/2011.  FINDINGS: Chronic microvascular ischemic change and cortical atrophy are again seen. There is no  evidence of acute abnormality including infarct, hemorrhage, mass lesion, mass effect, midline shift or abnormal extra-axial fluid collection. There is no hydrocephalus or pneumocephalus. Mild mucosal thickening left maxillary sinus is noted. The calvarium is intact.  IMPRESSION: No acute finding. Atrophy and chronic microvascular ischemic change.   Electronically Signed   By: Inge Rise M.D.   On: 02/23/2013 20:01   Mri Brain Without Contrast  02/24/2013   CLINICAL DATA:  Altered mental status. Confusion. End-stage renal disease. Suspected fever related encephalopathy. Diabetes mellitus and hypertension.  EXAM: MRI HEAD WITHOUT CONTRAST  TECHNIQUE: Multiplanar, multiecho pulse sequences of the brain and surrounding structures were obtained without intravenous contrast.  COMPARISON:  CT head 02/23/2013.  MR head 12/20/2011.  FINDINGS: The patient was unable to remain motionless for the exam. Small or subtle lesions could be overlooked.  No evidence for acute  infarction, hemorrhage, mass lesion, hydrocephalus, or extra-axial fluid. Moderate cerebral and cerebellar atrophy. Mild to moderate subcortical and periventricular T2 and FLAIR hyperintensities, likely chronic microvascular ischemic change. Flow voids are maintained throughout the carotid, basilar, and vertebral arteries. Tiny focus of chronic hemorrhage right parietal subcortical white matter (image 138 series 8) likely related to chronic hypertension. Pituitary and cerebellar tonsils unremarkable.  In the upper cervical region there is image distortion due to hardware related to previous C3-C4 fusion. There is adjacent segment disc space narrowing at C2-C3 with moderate pannus surrounding the odontoid. The marrow signal in the C2 vertebral body is diffusely abnormal which may be due to an inflammatory process. Comparison with the prior MR showed a similar appearance to the C2 vertebral body and C2-3 disc space, but some progression of pannus and  abnormal marrow signal (edema) since 2013 is suspected on today's exam. In addition, there appears to be osseous spurring at C2-3 which compresses the upper cervical cord. Consider MRI of the cervical spine for further evaluation if there are signs and symptoms of myelopathy.  Calvarium and skullbase grossly unremarkable. Stable scalp lipomas in the occipital region. Bilateral proptosis due to increased orbital fat. Bilateral cataract extraction. Right middle and inferior nasal turbinate hypertrophy with slight fluid layering in the right greater than left maxillary sinuses.  Compared with 2013, similar degree of atrophy and small vessel disease was noted.  IMPRESSION: No acute stroke or hemorrhage.  Moderate atrophy and chronic microvascular ischemic change, stable from 2013.  Spondylosis at C2-3 related to prior C3-C4 fusion along with pannus and abnormal marrow signal in the C2 vertebral body, likely inflammatory. Consider cervical spine MRI without contrast if there signs and symptoms referable to this region.   Electronically Signed   By: Rolla Flatten M.D.   On: 02/24/2013 08:32    Scheduled Meds: . aspirin EC  81 mg Oral Daily  . calcium acetate  2,001 mg Oral TID WC  . ceFEPime (MAXIPIME) IV  2 g Intravenous Q M,W,F-HD  . doxazosin  2 mg Oral QHS  . feeding supplement (NEPRO CARB STEADY)  237 mL Oral BID BM  . ferric gluconate (FERRLECIT/NULECIT) IV  125 mg Intravenous Q M,W,F-HD  . insulin aspart  0-9 Units Subcutaneous TID WC  . levothyroxine  175 mcg Oral q morning - 10a  . linagliptin  5 mg Oral Daily  . multivitamin  1 tablet Oral QHS  . nebivolol  5 mg Oral Daily  . vancomycin  1,000 mg Intravenous Q M,W,F-HD   Continuous Infusions:   Principal Problem:   Acute encephalopathy Active Problems:   Diabetes mellitus   Fever   Nausea vomiting and diarrhea    Time spent: [facility] Pager [phone]. If 7PM-7AM, please contact night-coverage at  [email or web address], password [facility] 02/24/2013, 7:04 PM  LOS: 1 day

## 2013-02-24 NOTE — Consult Note (Signed)
WOC wound consult note Reason for Consult: VAC dressing change to right foot after transmetatarsal amputation.   Wound type: Surgical site.  Measurement: 9 cm x 3 cm x 0.1cm  Wound bed: 100% beefy red granulation tissue.  Drainage (amount, consistency, odor) Minimal bloody drainage noted in canister.  Periwound: Intact.  Evidence of wound healing noted at left and right margins. Intact scar tissue.  Dressing procedure/placement/frequency: Wound cleansed with NS and pat gently dry. Periwound protected with skin prep and VAC drape.  Granufoam applied to wound bed and bridged up to dorsal foot to aid in mobility. Seal obtained at 125 mmHg without difficulty.  Change Mon Wed Fri. Will not follow at this time.  Please re-consult if needed.  Domenic Moras RN BSN [place] Pager [phone]

## 2013-02-24 NOTE — Progress Notes (Signed)
Notified charge nurse on [place] about stroke swallow evaluation that needed to be done on patient. Patient failed stroke swallow eval performed by RN from [place], MD notified of result. Will continue to monitor.

## 2013-02-24 NOTE — Progress Notes (Signed)
Utilization review completed.  

## 2013-02-25 DIAGNOSIS — N185 Chronic kidney disease, stage 5: Secondary | ICD-10-CM

## 2013-02-25 DIAGNOSIS — E039 Hypothyroidism, unspecified: Secondary | ICD-10-CM

## 2013-02-25 DIAGNOSIS — N189 Chronic kidney disease, unspecified: Secondary | ICD-10-CM

## 2013-02-25 DIAGNOSIS — N039 Chronic nephritic syndrome with unspecified morphologic changes: Secondary | ICD-10-CM

## 2013-02-25 DIAGNOSIS — D631 Anemia in chronic kidney disease: Secondary | ICD-10-CM

## 2013-02-25 LAB — CBC
HCT: 39 % (ref 39.0–52.0)
Hemoglobin: 11.8 g/dL — ABNORMAL LOW (ref 13.0–17.0)
MCH: 26.5 pg (ref 26.0–34.0)
MCHC: 30.3 g/dL (ref 30.0–36.0)
MCV: 87.6 fL (ref 78.0–100.0)
Platelets: 171 10*3/uL (ref 150–400)
RBC: 4.45 MIL/uL (ref 4.22–5.81)
RDW: 17.1 % — AB (ref 11.5–15.5)
WBC: 4 10*3/uL (ref 4.0–10.5)

## 2013-02-25 LAB — RENAL FUNCTION PANEL
ALBUMIN: 2.8 g/dL — AB (ref 3.5–5.2)
BUN: 22 mg/dL (ref 6–23)
CHLORIDE: 97 meq/L (ref 96–112)
CO2: 26 mEq/L (ref 19–32)
Calcium: 8.5 mg/dL (ref 8.4–10.5)
Creatinine, Ser: 5.81 mg/dL — ABNORMAL HIGH (ref 0.50–1.35)
GFR calc Af Amer: 10 mL/min — ABNORMAL LOW (ref >90)
GFR, EST NON AFRICAN AMERICAN: 9 mL/min — AB (ref >90)
Glucose, Bld: 125 mg/dL — ABNORMAL HIGH (ref 70–99)
PHOSPHORUS: 2.5 mg/dL (ref 2.3–4.6)
POTASSIUM: 3.9 meq/L (ref 3.7–5.3)
Sodium: 139 mEq/L (ref 137–147)

## 2013-02-25 LAB — GLUCOSE, CAPILLARY
GLUCOSE-CAPILLARY: 94 mg/dL (ref 70–99)
Glucose-Capillary: 126 mg/dL — ABNORMAL HIGH (ref 70–99)
Glucose-Capillary: 146 mg/dL — ABNORMAL HIGH (ref 70–99)

## 2013-02-25 NOTE — Progress Notes (Signed)
  [facility] Progress Note   Subjective: More alert today, no complaints, looks "very much" better acc to daughter who is here with pt  Exam  Blood pressure 133/68, pulse 64, temperature 97.8 F (36.6 C), temperature source Oral, resp. rate 18, height 5' 10.08" (1.78 m), weight 87 kg (191 lb 12.8 oz), SpO2 99.00%. Alert, talking, oriented No jvd Chest clear bilat RRR 2/6 sem LUSB Abd soft, nt, nd, no mass or ascites R foot TMA w wound vac in place, nontender, no odor or erythema noted (vacnot removed), L leg no edema, ulcer or gangrene Neuro is ox3, nf Access L arm AV fistula with no signs of infection  Dialysis: MWF at [facility] 4h   87.5kg   2K/2.25[place]ath   Heparin 8700 L arm AVF Hectorol 1     Epo 6000     Venofer 100/mg each HD, 8 doses remain Last labs: Hb 11.4 tsat 18% phos 4.7  pth 125   Assessment: 1 Fever / AMS- much better today; R foot wound examined by [facility] nurse, no mention of signs of infection. Family has "all had the flu" recently according to daughter. BCx's are neg so far, access not infected on exam.  2 ESRD, HD tomorrow 3 R TMA- wound vac in place 4 Anemia- Hb up, holding epo 5 2HPTH- no chg in Rx 6 DM per primary 7 HTN/volume- on BB, cardura, at dry wt   [name] MD  pager [phone]    cell [phone]  02/25/2013, 1:23 PM     Recent Labs Lab 02/24/13 0130 02/24/13 0425 02/25/13 0420  NA 141 142 139  K 4.7 4.8 3.9  CL 100 101 97  CO2 24 26 26   GLUCOSE 135* 141* 125*  BUN 28* 31* 22  CREATININE 7.17* 7.56* 5.81*  CALCIUM 9.0 8.8 8.5  PHOS  --   --  2.5    Recent Labs Lab 02/23/13 1808 02/24/13 0425 02/25/13 0420  AST 106* 13  --   ALT 21 10  --   ALKPHOS 40 46  --   BILITOT 0.4 0.4  --   PROT 8.1 6.7  --   ALBUMIN 3.3* 2.8* 2.8*    Recent Labs Lab 02/23/13 1931 02/24/13 0130 02/25/13 0420  WBC 7.5 5.5 4.0  NEUTROABS 6.5 4.6  --   HGB 13.9 12.7* 11.8*  HCT 44.8 41.9 39.0  MCV 87.2 87.8 87.6  PLT 190 155 171   .  aspirin EC  81 mg Oral Daily  . calcium acetate  2,001 mg Oral TID WC  . ceFEPime (MAXIPIME) IV  2 g Intravenous Q M,W,F-HD  . doxazosin  2 mg Oral QHS  . feeding supplement (NEPRO CARB STEADY)  237 mL Oral BID BM  . ferric gluconate (FERRLECIT/NULECIT) IV  125 mg Intravenous Q M,W,F-HD  . insulin aspart  0-9 Units Subcutaneous TID WC  . levothyroxine  175 mcg Oral q morning - 10a  . linagliptin  5 mg Oral Daily  . multivitamin  1 tablet Oral QHS  . nebivolol  5 mg Oral Daily  . vancomycin  1,000 mg Intravenous Q M,W,F-HD     acetaminophen, oxyCODONE

## 2013-02-25 NOTE — Evaluation (Signed)
Physical Therapy Evaluation Jack [name] Details Name: Jack [name] MRN: [id number] DOB: Dec 20, 1945 Today's Date: 02/25/2013 Time: 1517-6160 PT Time Calculation (min): 23 min  PT Assessment / Plan / Recommendation History of Present Illness  Jack [name] is a 68 y.o. male with known history of ESRD hemodialysis Monday Wednesday Friday, recent admitted for acute osteomyelitis of the right foot and has had transmetatarsal amputation in December and has had wound VAC placed since then was found to be confused by the family later this evening. Jack [name] had also had nausea vomiting and diarrhea. Jack [name]'s family states that Jack [name] was doing fine in the morning later when they came in the evening Jack [name] was found with vomitus and diarrhea all over. Jack [name] was found to be febrile in the ER. CT head did not show anything acute. Jack [name]'s mental status is slowly improved at this time and has periods when he gets confused. Chest x-ray was unremarkable. UA shows possibility of UTI. CT abdomen and pelvis is pending but abdomen appears benign on exam. On exam Jack [name] is a right eye does not abduct completely. Jack [name]'s family does not know if this is a new finding. Jack [name] had a recent laser eye surgery on the right eye 2 weeks ago. Jack [name] otherwise did not complain of any chest pain or shortness of breath. On exam Jack [name] is able to move all extremities but mildly weak on the left upper extremity. Jack [name] has been in [place] started on vancomycin and cefepime. Blood cultures and urine cultures have been ordered. Family states that others in the family who is having nausea vomiting and diarrhea last few days.   Clinical Impression  Pt adm due to the above. Presents with decreased independence with functional mobility secondary to deficits indicated below (see PT problem list). Pt to benefit from skilled PT to increase mobility and independence with mobility prior to D/C home with family. Family was independent  with ADLS and mobility prior to admission. Family is very supportive and agreeable to providing 24/7 (A). Pt is a fall risk due to recent cognitive deficits. Encouraged family to bring in post op shoe to progress ambulation.     PT Assessment  Jack [name] needs continued PT services    Follow Up Recommendations  Home health PT;Supervision/Assistance - 24 hour    Does the Jack [name] have the potential to tolerate intense rehabilitation      Barriers to Discharge        Equipment Recommendations  None recommended by PT    Recommendations for Other Services OT consult   Frequency Min 3X/week    Precautions / Restrictions Precautions Precautions: Fall Precaution Comments: wound VAC Restrictions Weight Bearing Restrictions: No Other Position/Activity Restrictions: pt reports he has a post op shoe that he wears on Rt LE since his amputation; does not have it with him; encouraged NWB    Pertinent Vitals/Pain 6/10 In Rt foot; Jack [name] repositioned for comfort      Mobility  Bed Mobility Overal bed mobility: Needs Assistance Bed Mobility: Supine to Sit Supine to sit: HOB elevated;Supervision General bed mobility comments: pt relies heavily on handrails; cues for sequencing  Transfers Overall transfer level: Needs assistance Equipment used: Rolling walker (2 wheeled) Transfers: Sit to/from [employer] Transfers Sit to Stand: From elevated surface;Mod assist Stand pivot transfers: Mod assist General transfer comment: pt with increased difficulty maintaining NWB on Rt LE; pt unsteady with transfers; (A) to maintain balance and (A) with pivotal steps to chair while managing RW; max multimodal directional cues for  sequencing           PT Diagnosis: Difficulty walking;Generalized weakness;Acute pain  PT Problem List: Decreased balance;Decreased mobility;Decreased strength;Decreased cognition;Decreased knowledge of use of DME;Decreased safety awareness;Pain;Impaired sensation;Decreased  knowledge of precautions PT Treatment Interventions: DME instruction;Gait training;Functional mobility training;Therapeutic activities;Therapeutic exercise;Balance training;Neuromuscular re-education;Jack [name]/family education     PT Goals(Current goals can be found in the care plan section) Acute Rehab PT Goals Jack [name] Stated Goal: to go home PT Goal Formulation: With Jack [name]/family Time For Goal Achievement: 03/11/13 Potential to Achieve Goals: Good  Visit Information  Last PT Received On: 02/25/13 Assistance Needed: +1 History of Present Illness: Jack [name] is a 68 y.o. male with known history of ESRD hemodialysis Monday Wednesday Friday, recent admitted for acute osteomyelitis of the right foot and has had transmetatarsal amputation in December and has had wound VAC placed since then was found to be confused by the family later this evening. Jack [name] had also had nausea vomiting and diarrhea. Jack [name]'s family states that Jack [name] was doing fine in the morning later when they came in the evening Jack [name] was found with vomitus and diarrhea all over. Jack [name] was found to be febrile in the ER. CT head did not show anything acute. Jack [name]'s mental status is slowly improved at this time and has periods when he gets confused. Chest x-ray was unremarkable. UA shows possibility of UTI. CT abdomen and pelvis is pending but abdomen appears benign on exam. On exam Jack [name] is a right eye does not abduct completely. Jack [name]'s family does not know if this is a new finding. Jack [name] had a recent laser eye surgery on the right eye 2 weeks ago. Jack [name] otherwise did not complain of any chest pain or shortness of breath. On exam Jack [name] is able to move all extremities but mildly weak on the left upper extremity. Jack [name] has been in [place] started on vancomycin and cefepime. Blood cultures and urine cultures have been ordered. Family states that others in the family who is having nausea vomiting and diarrhea last  few days.        Prior [place] expects to be discharged to:: Private residence Living Arrangements: Children Available Help at Discharge: Family;Available 24 hours/day Type of Home: Apartment Home Access: Level entry Home Layout: One level Home Equipment: Walker - 2 wheels;Cane - single point;Shower seat;Wheelchair - manual Prior Function Level of Independence: Independent with assistive device(s) Comments: ambulates with RW; pt and daughter report pt was independent with al ADLs; family is always with him for stand by (A) Dominant Hand: Right    Cognition  Cognition Arousal/Alertness: Awake/alert Behavior During Therapy: WFL for tasks assessed/performed Overall Cognitive Status: Impaired/Different from baseline Area of Impairment: Orientation;Memory;Following commands;Safety/judgement;Problem solving Orientation Level: Disoriented to;Place Memory: Decreased short-term memory Following Commands: Follows one step commands with increased time Safety/Judgement: Decreased awareness of deficits;Decreased awareness of safety Problem Solving: Difficulty sequencing;Requires verbal cues;Requires tactile cues General Comments: pt with intermittent confusion during session    Extremity/Trunk Assessment Upper Extremity Assessment Upper Extremity Assessment: Defer to OT evaluation Lower Extremity Assessment Lower Extremity Assessment: RLE deficits/detail;Generalized weakness RLE Deficits / Details: unable to assess foot; knee grossly 3+/5 RLE Sensation: decreased light touch Cervical / Trunk Assessment Cervical / Trunk Assessment: Normal   Balance Balance Overall balance assessment: Needs assistance Sitting-balance support: Feet supported;Bilateral upper extremity supported Sitting balance-Leahy Scale: Fair Standing balance support: During functional activity;Bilateral upper extremity supported Standing balance-Leahy Scale: Poor Standing balance comment:  requires bil UE support; unsteady with sitting  End of Session PT - End of Session Equipment Utilized During Treatment: Gait belt;Other (comment) (Wound VAC) Activity Tolerance: Jack [name] tolerated treatment well Jack [name] left: in chair;with call bell/phone within reach;with family/visitor present Nurse Communication: Mobility status;Precautions;Weight bearing status  GP     Gustavus Bryant, [place] 02/25/2013, 5:17 PM

## 2013-02-25 NOTE — Progress Notes (Signed)
[facility] PROGRESS NOTE  Jack [name] KPV:[id number] DOB: 04/06/1945 DOA: 02/23/2013 PCP: [name], MD  Assessment/Plan: Acute toxic encephalopathy  -CT scan and MRI of brain negative for acute findings -Resolved, likely secondary to febrile illness Active Problems:  Fever/febrile illness  -Unclear etiology, influenza panel negative -Continue empiric antibiotics for now, cultures with no growth to date>>if on follow up still no growth may DC antibiotics -Likely viral, and per daughters 0n 2/11 they all had 'stomach virus' at home recently -Patient has had no further diarrhea and so no sample sent for C. difficile Diabetes mellitus  -Continue Trajenta Linagliptin and SSI Nausea vomiting and diarrhea/probable gastroenteritis -Likely secondary to viral etiology as above -CT of abdomen and pelvis with no acute findings -Await C. Difficile as above End-stage renal disease -Renal consulted, Monday Wednesday Friday dialysis-per renal Right foot, status post right transmetatarsal amputation -Continue wound vac -wound care consulted 2/11  -PT OT consulted today  Code Status: Full Family Communication: NONE at bedside Disposition Plan: Pending PT OT eval   Consultants:  Renal  Procedures:  None  Antibiotics:  Vancomycin and Zosyn started on 2/10  HPI/Subjective: Sitting up in chair, denies any complaints, has had no further diarrhea. Tolerating by mouth well  ea vomitingObjective: Filed Vitals:   02/25/13 1656  BP: 136/64  Pulse: 54  Temp: 99.2 F (37.3 C)  Resp: 18    Intake/Output Summary (Last 24 hours) at 02/25/13 1904 Last data filed at 02/25/13 1850  Gross per 24 hour  Intake    930 ml  Output   2252 ml  Net  -1322 ml   Filed Weights   02/23/13 2324 02/24/13 1813 02/24/13 2147  Weight: 90 kg (198 lb 6.6 oz) 88.2 kg (194 lb 7.1 oz) 87 kg (191 lb 12.8 oz)    Exam:  General: alert & oriented x 3 In NAD Cardiovascular: RRR, nl S1  s2 Respiratory: CTAB Abdomen: soft +BS NT/ND, no masses palpable Extremities: No cyanosis, right foot status post transferred to the transmetatarsal amputation with wound VAC    Data Reviewed: Basic Metabolic Panel:  Recent Labs Lab 02/23/13 1808 02/23/13 1852 02/24/13 0130 02/24/13 0425 02/25/13 0420  NA 139 141 141 142 139  K 7.6* 4.6 4.7 4.8 3.9  CL 98 102 100 101 97  CO2 24  --  _0 GLUCOSE 138* 144* 135* 141* 125*  BUN 25* 27* 28* 31* 22  CREATININE 6.34* 6.70* 7.17* 7.56* 5.81*  CALCIUM 9.3  --  9.0 8.8 8.5  PHOS  --   --   --   --  2.5   Liver Function Tests:  Recent Labs Lab 02/23/13 1808 02/24/13 0425 02/25/13 0420  AST 106* 13  --   ALT 21 10  --   ALKPHOS 40 46  --   BILITOT 0.4 0.4  --   PROT 8.1 6.7  --   ALBUMIN 3.3* 2.8* 2.8*   No results found for this basename: LIPASE, AMYLASE,  in the last 168 hours  Recent Labs Lab 02/24/13 0130  AMMONIA 26   CBC:  Recent Labs Lab 02/23/13 1852 02/23/13 1931 02/24/13 0130 02/25/13 0420  WBC  --  7.5 5.5 4.0  NEUTROABS  --  6.5 4.6  --   HGB 17.0 13.9 12.7* 11.8*  HCT 50.0 44.8 41.9 39.0  MCV  --  87.2 87.8 87.6  PLT  --  190 155 171   Cardiac Enzymes: No results found for this basename:  CKTOTAL, CKMB, CKMBINDEX, TROPONINI,  in the last 168 hours BNP (last 3 results)  Recent Labs  03/18/12 0940  PROBNP 12648.0*   CBG:  Recent Labs Lab 02/24/13 1701 02/24/13 2224 02/25/13 0737 02/25/13 1127 02/25/13 1658  GLUCAP 92 152* 126* 146* 94    Recent Results (from the past 240 hour(s))  CULTURE, BLOOD (ROUTINE X 2)     Status: None   Collection Time    02/23/13  6:25 PM      Result Value Ref Range Status   Specimen Description BLOOD RIGHT HAND   Final   Special Requests BOTTLES DRAWN AEROBIC AND ANAEROBIC 5CC   Final   Culture  Setup Time     Final   Value: 02/24/2013 03:31     Performed at [facility]   Culture     Final   Value:        BLOOD CULTURE RECEIVED NO  GROWTH TO DATE CULTURE WILL BE HELD FOR 5 DAYS BEFORE ISSUING A FINAL NEGATIVE REPORT     Performed at [facility]   Report Status PENDING   Incomplete  CULTURE, BLOOD (ROUTINE X 2)     Status: None   Collection Time    02/23/13  6:39 PM      Result Value Ref Range Status   Specimen Description BLOOD HAND LEFT   Final   Special Requests BOTTLES DRAWN AEROBIC ONLY 5CC   Final   Culture  Setup Time     Final   Value: 02/24/2013 03:32     Performed at [facility]   Culture     Final   Value:        BLOOD CULTURE RECEIVED NO GROWTH TO DATE CULTURE WILL BE HELD FOR 5 DAYS BEFORE ISSUING A FINAL NEGATIVE REPORT     Performed at [facility]   Report Status PENDING   Incomplete  MRSA PCR SCREENING     Status: None   Collection Time    02/24/13  4:17 AM      Result Value Ref Range Status   MRSA by PCR NEGATIVE  NEGATIVE Final   Comment:            The GeneXpert MRSA Assay (FDA     approved for NASAL specimens     only), is one component of a     comprehensive MRSA colonization     surveillance program. It is not     intended to diagnose MRSA     infection nor to guide or     monitor treatment for     MRSA infections.     Studies: Ct Abdomen Pelvis Wo Contrast  02/24/2013   CLINICAL DATA:  Vomiting, diarrhea, febrile, end-stage renal disease on dialysis, contrast allergy, hypertension, diabetes, multiple myeloma, asthma  EXAM: CT ABDOMEN AND PELVIS WITHOUT CONTRAST  TECHNIQUE: Multidetector CT imaging of the abdomen and pelvis was performed following the standard protocol without intravenous contrast. Oral contrast not administered. Sagittal and coronal MPR images reconstructed from axial data set.  COMPARISON:  01/03/2009  FINDINGS: Scattered atherosclerotic calcifications aorta and coronary arteries.  Right basilar atelectasis.  Tiny exophytic nodule question cyst at upper pole left kidney 11 mm diameter image 42 little changed.  Within limits of a nonenhanced  exam, no focal abnormalities of the liver, spleen, atrophic pancreas, kidneys, or adrenal glands.  Cardiac chambers appear mildly enlarged.  Normal appendix.  Thickened bladder wall with a 13 mm diameter vesicular  calculus and potentially a several tiny additional calculi.  Significant prostate enlargement, 6.1 x 5.3 cm image 88.  Scattered stool throughout colon.  Bowel loops otherwise unremarkable for technique.  Stomach decompressed, unable to accurately assess wall thickness.  No mass, adenopathy, free fluid or inflammatory process.  Elevation versus eventration of the right diaphragm.  Bones demineralized without focal abnormality.  IMPRESSION: Bladder calculi largest 13 mm diameter with associated wall thickening.  Coexistent prostate enlargement question chronic outlet obstruction.  Right basilar atelectasis.  No other intra-abdominal or intrapelvic abnormalities identified.   Electronically Signed   By: Lavonia Dana M.D.   On: 02/24/2013 10:18   Ct Head Wo Contrast  02/23/2013   CLINICAL DATA:  Fatigue.  Weakness.  EXAM: CT HEAD WITHOUT CONTRAST  TECHNIQUE: Contiguous axial images were obtained from the base of the skull through the vertex without intravenous contrast.  COMPARISON:  Brain MRI 12/20/2011.  FINDINGS: Chronic microvascular ischemic change and cortical atrophy are again seen. There is no evidence of acute abnormality including infarct, hemorrhage, mass lesion, mass effect, midline shift or abnormal extra-axial fluid collection. There is no hydrocephalus or pneumocephalus. Mild mucosal thickening left maxillary sinus is noted. The calvarium is intact.  IMPRESSION: No acute finding. Atrophy and chronic microvascular ischemic change.   Electronically Signed   By: Inge Rise M.D.   On: 02/23/2013 20:01   Mri Brain Without Contrast  02/24/2013   CLINICAL DATA:  Altered mental status. Confusion. End-stage renal disease. Suspected fever related encephalopathy. Diabetes mellitus and  hypertension.  EXAM: MRI HEAD WITHOUT CONTRAST  TECHNIQUE: Multiplanar, multiecho pulse sequences of the brain and surrounding structures were obtained without intravenous contrast.  COMPARISON:  CT head 02/23/2013.  MR head 12/20/2011.  FINDINGS: The patient was unable to remain motionless for the exam. Small or subtle lesions could be overlooked.  No evidence for acute infarction, hemorrhage, mass lesion, hydrocephalus, or extra-axial fluid. Moderate cerebral and cerebellar atrophy. Mild to moderate subcortical and periventricular T2 and FLAIR hyperintensities, likely chronic microvascular ischemic change. Flow voids are maintained throughout the carotid, basilar, and vertebral arteries. Tiny focus of chronic hemorrhage right parietal subcortical white matter (image 138 series 8) likely related to chronic hypertension. Pituitary and cerebellar tonsils unremarkable.  In the upper cervical region there is image distortion due to hardware related to previous C3-C4 fusion. There is adjacent segment disc space narrowing at C2-C3 with moderate pannus surrounding the odontoid. The marrow signal in the C2 vertebral body is diffusely abnormal which may be due to an inflammatory process. Comparison with the prior MR showed a similar appearance to the C2 vertebral body and C2-3 disc space, but some progression of pannus and abnormal marrow signal (edema) since 2013 is suspected on today's exam. In addition, there appears to be osseous spurring at C2-3 which compresses the upper cervical cord. Consider MRI of the cervical spine for further evaluation if there are signs and symptoms of myelopathy.  Calvarium and skullbase grossly unremarkable. Stable scalp lipomas in the occipital region. Bilateral proptosis due to increased orbital fat. Bilateral cataract extraction. Right middle and inferior nasal turbinate hypertrophy with slight fluid layering in the right greater than left maxillary sinuses.  Compared with 2013, similar  degree of atrophy and small vessel disease was noted.  IMPRESSION: No acute stroke or hemorrhage.  Moderate atrophy and chronic microvascular ischemic change, stable from 2013.  Spondylosis at C2-3 related to prior C3-C4 fusion along with pannus and abnormal marrow signal in the C2 vertebral body,  likely inflammatory. Consider cervical spine MRI without contrast if there signs and symptoms referable to this region.   Electronically Signed   By: Rolla Flatten M.D.   On: 02/24/2013 08:32    Scheduled Meds: . aspirin EC  81 mg Oral Daily  . calcium acetate  2,001 mg Oral TID WC  . ceFEPime (MAXIPIME) IV  2 g Intravenous Q M,W,F-HD  . doxazosin  2 mg Oral QHS  . feeding supplement (NEPRO CARB STEADY)  237 mL Oral BID BM  . ferric gluconate (FERRLECIT/NULECIT) IV  125 mg Intravenous Q M,W,F-HD  . insulin aspart  0-9 Units Subcutaneous TID WC  . levothyroxine  175 mcg Oral q morning - 10a  . linagliptin  5 mg Oral Daily  . multivitamin  1 tablet Oral QHS  . nebivolol  5 mg Oral Daily  . vancomycin  1,000 mg Intravenous Q M,W,F-HD   Continuous Infusions:   Principal Problem:   Acute encephalopathy Active Problems:   Diabetes mellitus   Fever   Nausea vomiting and diarrhea    Time spent: [place] [facility] Pager [phone]. If 7PM-7AM, please contact night-coverage at [email or web address], password [facility] 02/25/2013, 7:04 PM  LOS: 2 days

## 2013-02-26 LAB — GLUCOSE, CAPILLARY
GLUCOSE-CAPILLARY: 156 mg/dL — AB (ref 70–99)
GLUCOSE-CAPILLARY: 111 mg/dL — AB (ref 70–99)
Glucose-Capillary: 135 mg/dL — ABNORMAL HIGH (ref 70–99)
Glucose-Capillary: 114 mg/dL — ABNORMAL HIGH (ref 70–99)

## 2013-02-26 LAB — CBC
HCT: 37.6 % — ABNORMAL LOW (ref 39.0–52.0)
HEMOGLOBIN: 11.6 g/dL — AB (ref 13.0–17.0)
MCH: 26.3 pg (ref 26.0–34.0)
MCHC: 30.9 g/dL (ref 30.0–36.0)
MCV: 85.3 fL (ref 78.0–100.0)
Platelets: 174 10*3/uL (ref 150–400)
RBC: 4.41 MIL/uL (ref 4.22–5.81)
RDW: 16.3 % — AB (ref 11.5–15.5)
WBC: 4.7 10*3/uL (ref 4.0–10.5)

## 2013-02-26 LAB — RENAL FUNCTION PANEL
ALBUMIN: 2.9 g/dL — AB (ref 3.5–5.2)
BUN: 24 mg/dL — ABNORMAL HIGH (ref 6–23)
CHLORIDE: 97 meq/L (ref 96–112)
CO2: 28 mEq/L (ref 19–32)
Calcium: 8.5 mg/dL (ref 8.4–10.5)
Creatinine, Ser: 5.97 mg/dL — ABNORMAL HIGH (ref 0.50–1.35)
GFR calc non Af Amer: 9 mL/min — ABNORMAL LOW (ref >90)
GFR, EST AFRICAN AMERICAN: 10 mL/min — AB (ref >90)
Glucose, Bld: 100 mg/dL — ABNORMAL HIGH (ref 70–99)
POTASSIUM: 3.9 meq/L (ref 3.7–5.3)
Phosphorus: 2 mg/dL — ABNORMAL LOW (ref 2.3–4.6)
SODIUM: 137 meq/L (ref 137–147)

## 2013-02-26 MED ORDER — HEPARIN SODIUM (PORCINE) 1000 UNIT/ML IJ SOLN
8700.0000 [IU] | Freq: Once | INTRAMUSCULAR | Status: AC
  Administered 2013-02-26: 8700 [IU] via INTRAVENOUS

## 2013-02-26 MED ORDER — LIDOCAINE-PRILOCAINE 2.5-2.5 % EX CREA
1.0000 | TOPICAL_CREAM | CUTANEOUS | Status: DC | PRN
Start: 2013-02-26 — End: 2013-02-26

## 2013-02-26 MED ORDER — DOXERCALCIFEROL 4 MCG/2ML IV SOLN
INTRAVENOUS | Status: AC
  Filled 2013-02-26: qty 2

## 2013-02-26 MED ORDER — NEPRO/CARBSTEADY PO LIQD: 237.0000 mL | ORAL | Status: DC | PRN

## 2013-02-26 MED ORDER — ALTEPLASE 2 MG IJ SOLR
2.0000 mg | Freq: Once | INTRAMUSCULAR | Status: DC | PRN
  Filled 2013-02-26: qty 2

## 2013-02-26 MED ORDER — DEXTROSE 5 % IV SOLN
2.0000 g | INTRAVENOUS | Status: DC
  Filled 2013-02-26: qty 2

## 2013-02-26 MED ORDER — DOXERCALCIFEROL 4 MCG/2ML IV SOLN
1.0000 ug | INTRAVENOUS | Status: DC
  Administered 2013-02-26: 1 ug via INTRAVENOUS
  Filled 2013-02-26: qty 2

## 2013-02-26 MED ORDER — LIDOCAINE HCL (PF) 1 % IJ SOLN: 5.0000 mL | INTRAMUSCULAR | Status: DC | PRN

## 2013-02-26 MED ORDER — PENTAFLUOROPROP-TETRAFLUOROETH EX AERO: 1.0000 "application " | INHALATION_SPRAY | CUTANEOUS | Status: DC | PRN

## 2013-02-26 MED ORDER — HEPARIN SODIUM (PORCINE) 1000 UNIT/ML DIALYSIS
1000.0000 [IU] | INTRAMUSCULAR | Status: DC | PRN
Start: 2013-02-26 — End: 2013-02-26
  Filled 2013-02-26: qty 1

## 2013-02-26 MED ORDER — SODIUM CHLORIDE 0.9 % IV SOLN: 100.0000 mL | INTRAVENOUS | Status: DC | PRN

## 2013-02-26 MED ORDER — HEPARIN SODIUM (PORCINE) 1000 UNIT/ML DIALYSIS
8700.0000 [IU] | Freq: Once | INTRAMUSCULAR | Status: DC
  Filled 2013-02-26: qty 9

## 2013-02-26 NOTE — Progress Notes (Signed)
OT Cancellation Note  Patient Details Name: Jack [name] MRN: [id number] DOB: Jan 23, 1945   Cancelled Treatment:    Reason Eval/Treat Not Completed: Patient at procedure or test/ unavailable (PT just started their tx)  Runa Whittingham A 02/26/2013, 4:09 PM

## 2013-02-26 NOTE — Progress Notes (Addendum)
[facility] PROGRESS NOTE  Jack [name] WIO:[id number] DOB: 1945-03-27 DOA: 02/23/2013 PCP: [name], MD  Assessment/Plan: Acute toxic encephalopathy  -CT scan and MRI of brain negative for acute findings -Resolved, likely secondary to febrile illness  ?UTI -dialysis patient D/c abx and watch for fever -no urine culture sent  Fever/febrile illness  -resolved -Unclear etiology, influenza panel negative -Continue empiric antibiotics for now, cultures with no growth to date>>d/c abx -Likely viral, and per daughters 0n 2/11 they all had 'stomach virus' at home recently -Patient has had no further diarrhea and so no sample sent for C. Difficile  Diabetes mellitus  -Continue Trajenta Linagliptin and SSI  Nausea vomiting and diarrhea/probable gastroenteritis -Likely secondary to viral etiology as above -CT of abdomen and pelvis with no acute findings  End-stage renal disease -Renal consulted, Monday Wednesday Friday dialysis-per renal  Right foot, status post right transmetatarsal amputation -Continue wound vac -wound care consulted 2/11  -PT OT consulted today  Code Status: Full Family Communication: daughter at bedside Disposition Plan: Pending PT OT eval- home   Consultants:  Renal  Procedures:  None  Antibiotics:  Vancomycin and Zosyn started on 2/10- d/c on 2/13  HPI/Subjective: Wanting to go home No SOb, no CP  ea vomitingObjective: Filed Vitals:   02/26/13 1040  BP: 143/74  Pulse: 62  Temp: 98.1 F (36.7 C)  Resp: 12    Intake/Output Summary (Last 24 hours) at 02/26/13 1118 Last data filed at 02/26/13 1040  Gross per 24 hour  Intake    320 ml  Output    956 ml  Net   -636 ml   Filed Weights   02/25/13 2136 02/26/13 0650 02/26/13 1040  Weight: 87.5 kg (192 lb 14.4 oz) 87.9 kg (193 lb 12.6 oz) 85.6 kg (188 lb 11.4 oz)    Exam:  General: alert & oriented x 3 In NAD Cardiovascular: RRR, nl S1 s2 Respiratory:  CTAB Abdomen: soft +BS NT/ND, no masses palpable Extremities: No cyanosis, right foot status post transferred to the transmetatarsal amputation with wound VAC    Data Reviewed: Basic Metabolic Panel:  Recent Labs Lab 02/23/13 1808 02/23/13 1852 02/24/13 0130 02/24/13 0425 02/25/13 0420 02/26/13 0714  NA 139 141 141 142 139 137  K 7.6* 4.6 4.7 4.8 3.9 3.9  CL 98 102 100 101 97 97  CO2 24  --  24 26 26 28   GLUCOSE 138* 144* 135* 141* 125* 100*  BUN 25* 27* 28* 31* 22 24*  CREATININE 6.34* 6.70* 7.17* 7.56* 5.81* 5.97*  CALCIUM 9.3  --  9.0 8.8 8.5 8.5  PHOS  --   --   --   --  2.5 2.0*   Liver Function Tests:  Recent Labs Lab 02/23/13 1808 02/24/13 0425 02/25/13 0420 02/26/13 0714  AST 106* 13  --   --   ALT 21 10  --   --   ALKPHOS 40 46  --   --   BILITOT 0.4 0.4  --   --   PROT 8.1 6.7  --   --   ALBUMIN 3.3* 2.8* 2.8* 2.9*   No results found for this basename: LIPASE, AMYLASE,  in the last 168 hours  Recent Labs Lab 02/24/13 0130  AMMONIA 26   CBC:  Recent Labs Lab 02/23/13 1852 02/23/13 1931 02/24/13 0130 02/25/13 0420 02/26/13 0714  WBC  --  7.5 5.5 4.0 4.7  NEUTROABS  --  6.5 4.6  --   --  HGB 17.0 13.9 12.7* 11.8* 11.6*  HCT 50.0 44.8 41.9 39.0 37.6*  MCV  --  87.2 87.8 87.6 85.3  PLT  --  190 155 171 174   Cardiac Enzymes: No results found for this basename: CKTOTAL, CKMB, CKMBINDEX, TROPONINI,  in the last 168 hours BNP (last 3 results)  Recent Labs  03/18/12 0940  PROBNP 12648.0*   CBG:  Recent Labs Lab 02/24/13 2224 02/25/13 0737 02/25/13 1127 02/25/13 1658 02/25/13 2132  GLUCAP 152* 126* 146* 94 156*    Recent Results (from the past 240 hour(s))  CULTURE, BLOOD (ROUTINE X 2)     Status: None   Collection Time    02/23/13  6:25 PM      Result Value Ref Range Status   Specimen Description BLOOD RIGHT HAND   Final   Special Requests BOTTLES DRAWN AEROBIC AND ANAEROBIC 5CC   Final   Culture  Setup Time     Final    Value: 02/24/2013 03:31     Performed at [facility]   Culture     Final   Value:        BLOOD CULTURE RECEIVED NO GROWTH TO DATE CULTURE WILL BE HELD FOR 5 DAYS BEFORE ISSUING A FINAL NEGATIVE REPORT     Performed at [facility]   Report Status PENDING   Incomplete  CULTURE, BLOOD (ROUTINE X 2)     Status: None   Collection Time    02/23/13  6:39 PM      Result Value Ref Range Status   Specimen Description BLOOD HAND LEFT   Final   Special Requests BOTTLES DRAWN AEROBIC ONLY 5CC   Final   Culture  Setup Time     Final   Value: 02/24/2013 03:32     Performed at [facility]   Culture     Final   Value:        BLOOD CULTURE RECEIVED NO GROWTH TO DATE CULTURE WILL BE HELD FOR 5 DAYS BEFORE ISSUING A FINAL NEGATIVE REPORT     Performed at [facility]   Report Status PENDING   Incomplete  MRSA PCR SCREENING     Status: None   Collection Time    02/24/13  4:17 AM      Result Value Ref Range Status   MRSA by PCR NEGATIVE  NEGATIVE Final   Comment:            The GeneXpert MRSA Assay (FDA     approved for NASAL specimens     only), is one component of a     comprehensive MRSA colonization     surveillance program. It is not     intended to diagnose MRSA     infection nor to guide or     monitor treatment for     MRSA infections.     Studies: No results found.  Scheduled Meds: . aspirin EC  81 mg Oral Daily  . calcium acetate  2,001 mg Oral TID WC  . ceFEPime (MAXIPIME) IV  2 g Intravenous Q M,W,F-1800  . doxazosin  2 mg Oral QHS  . doxercalciferol  1 mcg Intravenous Q M,W,F-HD  . feeding supplement (NEPRO CARB STEADY)  237 mL Oral BID BM  . ferric gluconate (FERRLECIT/NULECIT) IV  125 mg Intravenous Q M,W,F-HD  . insulin aspart  0-9 Units Subcutaneous TID WC  . levothyroxine  175 mcg Oral q morning - 10a  . linagliptin  5 mg Oral Daily  . multivitamin  1 tablet Oral QHS  . nebivolol  5 mg Oral Daily  . vancomycin  1,000 mg Intravenous Q  M,W,F-HD   Continuous Infusions:   Principal Problem:   Acute encephalopathy Active Problems:   Diabetes mellitus   Fever   Nausea vomiting and diarrhea    Time spent: [place] [place] [facility] Pager [phone] If 7PM-7AM, please contact night-coverage at [email or web address], password [facility] 02/26/2013, 11:18 AM  LOS: 3 days

## 2013-02-26 NOTE — Progress Notes (Signed)
SPEECH PATHOLOGY Cancellation Note:  Attempted to see pt. For f/u regarding diet tolerance.  Pt. Currently in HD.    Quinn Axe, CCC-SLP

## 2013-02-26 NOTE — Progress Notes (Signed)
Physical Therapy Treatment Patient Details Name: Jack [name] MRN: [id number] DOB: 1945-06-29 Today's Date: 02/26/2013 Time: 6967-8938 PT Time Calculation (min): 24 min  PT Assessment / Plan / Recommendation  History of Present Illness [name] is a 68 y.o. male with known history of ESRD hemodialysis Monday Wednesday Friday, recent admitted for acute osteomyelitis of the right foot and has had transmetatarsal amputation in December and has had wound VAC placed since then was found to be confused by the family later this evening. Patient had also had nausea vomiting and diarrhea.   PT Comments   Patient feels much improved.  Seems able to handle transfers which may be more what he was doing at home.  Encouraged him to keep NWB until advised by wound MD okay to bear weight on right foot.  Also discussed follow up PT as pt going to outpatient wound center likely not candidate for HHPT.  Feel will need outpatient PT once cleared by wound care center to bear weight and get in shoe prosthesis for transmetatarsal amputation.  Follow Up Recommendations  No PT follow up;Supervision/Assistance - 24 hour     Does the patient have the potential to tolerate intense rehabilitation   N/A  Barriers to Discharge  None      Equipment Recommendations  None recommended by PT    Recommendations for Other Services  None  Frequency Min 3X/week   Progress towards PT Goals Progress towards PT goals: Progressing toward goals  Plan Discharge plan needs to be updated    Precautions / Restrictions Precautions Precautions: Fall Precaution Comments: wound VAC Restrictions Other Position/Activity Restrictions: pt reports he has a post op shoe that he wears on Rt LE since his amputation; does not have it with him; encouraged NWB    Pertinent Vitals/Pain No pain currently    Mobility  Bed Mobility Bed Mobility: Supine to Sit Supine to sit: Modified independent (Device/Increase  time) Transfers Overall transfer level: Needs assistance Equipment used: Rolling walker (2 wheeled) Transfers: Sit to/from Stand Sit to Stand: Min assist Stand pivot transfers: Min guard General transfer comment: patient had shoe from home (general post op shoe) donned for transfer, pt did bear weight with transfer, but with discussion later seems as if wound care center wanting him to maintain NWB.  Encouraged NWB until told be wound center wound healed and he can bear weight as tolerated; demonstrated decreased safety stand to sit uncontrolled descent, encouraged improved UE use for safer technique    Exercises General Exercises - Upper Extremity Chair Push Up: AROM;Both;5 reps;Seated General Exercises - Lower Extremity Long Arc Quad: AROM;Both;10 reps;Seated Hip Flexion/Marching: AROM;Both;10 reps;Seated     PT Goals (current goals can now be found in the care plan section)    Visit Information  Last PT Received On: 02/26/13 History of Present Illness: Jack [name] is a 68 y.o. male with known history of ESRD hemodialysis Monday Wednesday Friday, recent admitted for acute osteomyelitis of the right foot and has had transmetatarsal amputation in December and has had wound VAC placed since then was found to be confused by the family later this evening. Patient had also had nausea vomiting and diarrhea.    Subjective Data      Cognition  Cognition Arousal/Alertness: Awake/alert Behavior During Therapy: WFL for tasks assessed/performed Overall Cognitive Status: Impaired/Different from baseline Area of Impairment: Problem solving Memory: Decreased short-term memory Following Commands: Follows multi-step commands consistently Safety/Judgement: Decreased awareness of safety Problem Solving: Requires verbal cues;Slow processing  Balance  Balance Sitting balance-Leahy Scale: Good Standing balance-Leahy Scale: Poor Standing balance comment: UE support for balance  End of  Session PT - End of Session Equipment Utilized During Treatment: Gait belt Activity Tolerance: Patient tolerated treatment well Patient left: in chair;with call bell/phone within reach;with family/visitor present   GP     [facility] 02/26/2013, 5:04 PM Magda Kiel, [place] 02/26/2013

## 2013-02-26 NOTE — Progress Notes (Signed)
Subjective:   Seen on dialysis, no complaints, mental status at baseline  Objective: Vital signs in last 24 hours: Temp:  [97.8 F (36.6 C)-99.2 F (37.3 C)] 98.8 F (37.1 C) (02/13 0631) Pulse Rate:  [52-64] 52 (02/13 0631) Resp:  [18] 18 (02/13 0631) BP: (122-136)/(62-68) 124/62 mmHg (02/13 0631) SpO2:  [99 %-100 %] 99 % (02/13 0631) Weight:  [87.5 kg (192 lb 14.4 oz)] 87.5 kg (192 lb 14.4 oz) (02/12 2136) Weight change: -0.7 kg (-1 lb 8.7 oz)  Intake/Output from previous day: 02/12 0701 - 02/13 0700 In: 520 [P.O.:520] Out: 0    Lab Results:  Recent Labs  02/24/13 0130 02/25/13 0420  WBC 5.5 4.0  HGB 12.7* 11.8*  HCT 41.9 39.0  PLT 155 171   BMET:  Recent Labs  02/24/13 0425 02/25/13 0420  NA 142 139  K 4.8 3.9  CL 101 97  CO2 26 26  GLUCOSE 141* 125*  BUN 31* 22  CREATININE 7.56* 5.81*  CALCIUM 8.8 8.5  ALBUMIN 2.8* 2.8*   No results found for this basename: PTH,  in the last 72 hours Iron Studies: No results found for this basename: IRON, TIBC, TRANSFERRIN, FERRITIN,  in the last 72 hours  EXAM: General appearance:  Alert, in no apparent distress Resp:  CTA without rales, rhonchi, or wheezes Cardio:  RRR with Gr II/VO systolic murmur, no rub GI: + BS, soft and nontender Extremities:  No edema, R TMA with wound vac since 12/3 Access:  AVF @ LUA with BFR 400  Dialysis: MWF at [place]  4h 87.5kg 2K/2.25Bath Heparin 8700 L arm AVF  Hectorol 1 Epo 6000 Venofer 100/mg each HD, 8 doses remain  Last labs: Hb 11.4 tsat 18% phos 4.7 pth 125  Assessment/Plan: 1. Fever / AMS - mental status @ baseline, R foot wound with no signs of infection, BCs negative, access without infection; WBCs 4, afebrile, on Vancomycin & Cefepime. Abx stopped today per primary 2. ESRD - HD on MWF @ [place], K 3.9.  HD today. 3. HTN/Volume - BP 993/57 on Bystolic 5 mg qd, Doxazosin 2 mg qhs; last wt 87.5 kg, UF goal of 2 L today. 4. Anemia - Hgb 11.8 on IV Fe. 5. Sec HPT - Ca 8.5 (9.5  corrected), P 2.5; Hectorol 1 mcg. Phoslo 3 with meals. 6. Nutrition - Alb 2.8, renal diet & vitamin. 7. DM - per primary. 8. R TMA - 12/3 per Dr. Oneida Alar, with wound vac.    LOS: 3 days   LYLES,CHARLES 02/26/2013,6:53 AM  I have seen and examined patient, discussed with PA and agree with assessment and plan as outlined above with additions as indicated.  No evidence of bacterial infection. May have viral illness, family has had "virus" or "flu" in multiple family members the past week or two.  Rec's as above.  [name] MD pager [phone]    cell [phone] 02/26/2013, 12:03 PM

## 2013-02-27 DIAGNOSIS — I498 Other specified cardiac arrhythmias: Secondary | ICD-10-CM

## 2013-02-27 LAB — GLUCOSE, CAPILLARY
GLUCOSE-CAPILLARY: 99 mg/dL (ref 70–99)
Glucose-Capillary: 98 mg/dL (ref 70–99)

## 2013-02-27 MED ORDER — SODIUM CHLORIDE 0.9 % IV SOLN: 125.0000 mg | INTRAVENOUS | Status: DC

## 2013-02-27 MED ORDER — DOXERCALCIFEROL 4 MCG/2ML IV SOLN: 1.0000 ug | INTRAVENOUS | Status: DC

## 2013-02-27 MED ORDER — NEPRO/CARBSTEADY PO LIQD: 237.0000 mL | Freq: Two times a day (BID) | ORAL | Status: DC

## 2013-02-27 MED ORDER — LEVOTHYROXINE SODIUM 175 MCG PO TABS: 175.0000 ug | ORAL_TABLET | Freq: Every morning | ORAL | Status: DC

## 2013-02-27 NOTE — Progress Notes (Signed)
   CARE MANAGEMENT NOTE 02/27/2013  Patient:  Jack [name], Jack [name]   Account Number:  [id number]  Date Initiated:  02/24/2013  Documentation initiated by:  Lizabeth [place]  Subjective/Objective Assessment:   admitted with AMS, fever, s/p right foot transmetat amputation 12/17/2012.  dc'd home with home care, wound VAC  ESRD/HD on MWF     Action/Plan:   resumption of home health services  [facility]/ RN, PT,OT  wound VAC   Anticipated DC Date:  02/27/2013   Anticipated DC Plan:  [place]  CM consult      [facility] Choice  Resumption Of Svcs/PTA Provider   Choice offered to / List presented to:             [place].   Status of service:  Completed, signed off Medicare Important Message given?   (If response is "NO", the following Medicare IM given date fields will be blank) Date Medicare IM given:   Date Additional Medicare IM given:    Discharge Disposition:  [place]  Per UR Regulation:    If discussed at Long Length of Stay Meetings, dates discussed:    Comments:  02/27/13 11:15 CM spoke with pt in room to confirm [facility] resumption orders.  Pt has [employer] wound vac in room and RN states she will hook pt up to [employer] pump prior to discharge. CM spoke with MD who states she has put the order in (verified order was in); and requested MD have synthroid prescription for pt who states his has run out (MD OKs). Resumption order faxed to [facility].  Pt understands [place] will be Monday 03/01/13. No other CM needs were communicated.  Mariane Masters, BSN, [place] [phone].  02/25/2103  [place], [place]  Patient active with [facility] for RN, PT, OT. Discharged 12/17/2012 s/p trans metatarsal ampution of right foot, wound VAC to site.  [facility] called to advise of admission [facility]  [phone] called regarding admission with the wound VAC.  Advised NCM the VAC can be  reapplied at time of discharge.

## 2013-02-27 NOTE — Progress Notes (Signed)
Subjective:  No complaints, mental status at baseline  Objective: Vital signs in last 24 hours: Temp:  [98.1 F (36.7 C)-98.7 F (37.1 C)] 98.4 F (36.9 C) (02/14 0537) Pulse Rate:  [46-62] 47 (02/14 0537) Resp:  [12-18] 14 (02/14 0537) BP: (106-160)/(49-88) 138/55 mmHg (02/14 0537) SpO2:  [98 %-100 %] 100 % (02/14 0537) Weight:  [85.599 kg (188 lb 11.4 oz)-85.6 kg (188 lb 11.4 oz)] 85.599 kg (188 lb 11.4 oz) (02/13 2119) Weight change: -1.9 kg (-4 lb 3 oz)  Intake/Output from previous day: 02/13 0701 - 02/14 0700 In: 360 [P.O.:360] Out: 956    Lab Results:  Recent Labs  02/25/13 0420 02/26/13 0714  WBC 4.0 4.7  HGB 11.8* 11.6*  HCT 39.0 37.6*  PLT 171 174   BMET:  Recent Labs  02/25/13 0420 02/26/13 0714  NA 139 137  K 3.9 3.9  CL 97 97  CO2 26 28  GLUCOSE 125* 100*  BUN 22 24*  CREATININE 5.81* 5.97*  CALCIUM 8.5 8.5  ALBUMIN 2.8* 2.9*   No results found for this basename: PTH,  in the last 72 hours Iron Studies: No results found for this basename: IRON, TIBC, TRANSFERRIN, FERRITIN,  in the last 72 hours  EXAM:  General appearance: Alert, in no apparent distress  Resp: CTA without rales, rhonchi, or wheezes  Cardio: RRR with Gr II/VI systolic murmur, no rub  GI: + BS, soft and nontender  Extremities: No edema, R TMA with wound vac since 12/3  Access: AVF @ LUA with + bruit   Dialysis: MWF at [place]  4h 87.5kg 2K/2.25Bath Heparin 8700 L arm AVF  Hectorol 1 Epo 6000 Venofer 100/mg each HD, 8 doses remain  Last labs: Hb 11.4 tsat 18% phos 4.7 pth 125  Assessment/Plan: 1. Fever / AMS - mental status @ baseline, R foot wound with no signs of infection, BCs negative, access without infection; WBCs 4.7, afebrile, antibiotics dc'd. 2. ESRD - HD on MWF @ [place], K 3.9. Next HD 2/16. 3. HTN/Volume - BP 408/14 on Bystolic 5 mg qd, Doxazosin 2 mg qhs; wt 85.6 kg s/p net UF 1 L yesterday, below EDW. 4. Anemia - Hgb 11.6 on IV Fe. 5. Sec HPT - Ca 8.5 (9.4  corrected), P 2; Hectorol 1 mcg. Phoslo 3 with meals. 6. Nutrition - Alb 2.9, renal diet & vitamin. 7. DM - per primary. 8. R TMA - 12/3 per Dr. Oneida Alar, with wound vac.      LOS: 4 days   LYLES,CHARLES 02/27/2013,7:02 AM   I have seen and examined patient, discussed with PA and agree with assessment and plan as outlined above. [name] MD pager [phone]    cell [phone] 02/27/2013, 11:27 AM

## 2013-02-27 NOTE — Progress Notes (Signed)
Dr Eliseo Squires notified patient having SB HR in the 26's, New order received.

## 2013-02-27 NOTE — Progress Notes (Signed)
Patient's heart rate still in lower 40s and 39 on heart monitor, notified NP on call, no orders received. Patient is asymptomatic. Will continue to monitor.

## 2013-02-27 NOTE — Progress Notes (Signed)
Patient's heart rate dropped to 39 on monitor, blood pressure and heart rate checked equal 160/66 and 46 respectively. Patient asymptomatic, NP on call notified. Will continue to monitor.

## 2013-02-27 NOTE — Discharge Summary (Signed)
Physician Discharge Summary  ZUBIN PONTILLO XMI:[id number] DOB: 1945/09/03 DOA: 02/23/2013  PCP: [name], MD  Admit date: 02/23/2013 Discharge date: 02/27/2013  Time spent: 35 minutes  Recommendations for Outpatient Follow-up:  1. Home health 2. Wound vac  Discharge Diagnoses:  Principal Problem:   Acute encephalopathy Active Problems:   Diabetes mellitus   Fever   Nausea vomiting and diarrhea   Discharge Condition: improved  Diet recommendation: renal  Filed Weights   02/26/13 0650 02/26/13 1040 02/26/13 2119  Weight: 87.9 kg (193 lb 12.6 oz) 85.6 kg (188 lb 11.4 oz) 85.599 kg (188 lb 11.4 oz)    History of present illness:  Jack [name] is a 68 y.o. male with known history of ESRD hemodialysis Monday Wednesday Friday, recent admitted for acute osteomyelitis of the right foot and has had transmetatarsal amputation in December and has had wound VAC placed since then was found to be confused by the family later this evening. Patient had also had nausea vomiting and diarrhea. Patient's family states that patient was doing fine in the morning later when they came in the evening patient was found with vomitus and diarrhea all over. Patient was found to be febrile in the ER. CT head did not show anything acute. Patient's mental status is slowly improved at this time and has periods when he gets confused. Chest x-ray was unremarkable. UA shows possibility of UTI. CT abdomen and pelvis is pending but abdomen appears benign on exam. On exam patient is a right eye does not abduct completely. Patient's family does not know if this is a new finding. Patient had a recent laser eye surgery on the right eye 2 weeks ago. Patient otherwise did not complain of any chest pain or shortness of breath. On exam patient is able to move all extremities but mildly weak on the left upper extremity. Patient has been in pretty started on vancomycin and cefepime. Blood cultures and urine cultures have  been ordered. Family states that others in the family who is having nausea vomiting and diarrhea last few days.   Hospital Course:  1. Fever / AMS - mental status @ baseline, R foot wound with no signs of infection, BCs negative, access without infection; WBCs 4.7, afebrile, antibiotics dc'd. 2. ESRD - HD on MWF @ [place], K 3.9. Next HD 2/16. 3. HTN/Volume - BP 825/00 on Bystolic 5 mg qd, Doxazosin 2 mg qhs; wt 85.6 kg s/p net UF 1 L yesterday, below EDW. 4. Anemia - Hgb 11.6 on IV Fe. 5. Sec HPT - Ca 8.5 (9.4 corrected), P 2; Hectorol 1 mcg. Phoslo 3 with meals. 6. Nutrition - Alb 2.9, renal diet & vitamin. 7. DM - stable 8. R TMA - 12/3 per Dr. Oneida Alar, with wound vac.    Procedures:  HD  Consultations:  renal  Discharge Exam: Filed Vitals:   02/27/13 0537  BP: 138/55  Pulse: 47  Temp: 98.4 F (36.9 C)  Resp: 14    General: pleasant/coopeartive Cardiovascular: rrr Respiratory: clear  Discharge Instructions  Discharge Orders   Future Appointments Provider Department Dept Phone   03/11/2013 9:45 AM Elam Dutch, MD [facility] [phone]   03/23/2013 1:00 PM [facility] [phone]   03/30/2013 12:00 PM [name], MD [facility] [phone]   Future Orders Complete By Expires   Discharge instructions  As directed    Comments:     Continue wound vac with  home health RN Renal diet   Increase activity slowly  As directed        Medication List    STOP taking these medications       BYSTOLIC 5 MG tablet  Generic drug:  nebivolol     calcium acetate 667 MG capsule  Commonly known as:  PHOSLO     dexamethasone 4 MG tablet  Commonly known as:  DECADRON     gabapentin 400 MG capsule  Commonly known as:  NEURONTIN     levofloxacin 500 MG tablet  Commonly known as:  LEVAQUIN      TAKE these medications       aspirin EC 81 MG tablet  Take 81  mg by mouth daily.     b complex-vitamin c-folic acid 0.8 MG Tabs tablet  Take 1 tablet by mouth daily.     doxazosin 2 MG tablet  Commonly known as:  CARDURA  Take 2 mg by mouth at bedtime.     doxercalciferol 4 MCG/2ML injection  Commonly known as:  HECTOROL  Inject 0.5 mLs (1 mcg total) into the vein every Monday, Wednesday, and Friday with hemodialysis.     feeding supplement (NEPRO CARB STEADY) Liqd  Take 237 mLs by mouth 2 (two) times daily between meals.     levothyroxine 175 MCG tablet  Commonly known as:  SYNTHROID, LEVOTHROID  Take 175 mcg by mouth every morning.     ONGLYZA 5 MG Tabs tablet  Generic drug:  saxagliptin HCl  Take 5 mg by mouth at bedtime.     oxyCODONE 5 MG immediate release tablet  Commonly known as:  Oxy IR/ROXICODONE  Take 5 mg by mouth every 6 (six) hours as needed for moderate pain.     SANTYL ointment  Generic drug:  collagenase  Apply 1 application topically daily.     sodium chloride 0.9 % SOLN 100 mL with ferric gluconate 12.5 MG/ML SOLN 125 mg  Inject 125 mg into the vein every Monday, Wednesday, and Friday with hemodialysis.       Allergies  Allergen Reactions  . Lisinopril Cough  . Ivp Dye [Iodinated Diagnostic Agents] Hives  . Morphine And Related Other (See Comments)    Bradycardia states patient      The results of significant diagnostics from this hospitalization (including imaging, microbiology, ancillary and laboratory) are listed below for reference.    Significant Diagnostic Studies: Ct Abdomen Pelvis Wo Contrast  02/24/2013   CLINICAL DATA:  Vomiting, diarrhea, febrile, end-stage renal disease on dialysis, contrast allergy, hypertension, diabetes, multiple myeloma, asthma  EXAM: CT ABDOMEN AND PELVIS WITHOUT CONTRAST  TECHNIQUE: Multidetector CT imaging of the abdomen and pelvis was performed following the standard protocol without intravenous contrast. Oral contrast not administered. Sagittal and coronal MPR images  reconstructed from axial data set.  COMPARISON:  01/03/2009  FINDINGS: Scattered atherosclerotic calcifications aorta and coronary arteries.  Right basilar atelectasis.  Tiny exophytic nodule question cyst at upper pole left kidney 11 mm diameter image 42 little changed.  Within limits of a nonenhanced exam, no focal abnormalities of the liver, spleen, atrophic pancreas, kidneys, or adrenal glands.  Cardiac chambers appear mildly enlarged.  Normal appendix.  Thickened bladder wall with a 13 mm diameter vesicular calculus and potentially a several tiny additional calculi.  Significant prostate enlargement, 6.1 x 5.3 cm image 88.  Scattered stool throughout colon.  Bowel loops otherwise unremarkable for technique.  Stomach decompressed, unable to accurately assess wall thickness.  No  mass, adenopathy, free fluid or inflammatory process.  Elevation versus eventration of the right diaphragm.  Bones demineralized without focal abnormality.  IMPRESSION: Bladder calculi largest 13 mm diameter with associated wall thickening.  Coexistent prostate enlargement question chronic outlet obstruction.  Right basilar atelectasis.  No other intra-abdominal or intrapelvic abnormalities identified.   Electronically Signed   By: Lavonia Dana M.D.   On: 02/24/2013 10:18   Dg Chest 2 View  02/23/2013   CLINICAL DATA:  Cough.  Lethargy.  EXAM: CHEST  2 VIEW  COMPARISON:  PA and lateral chest 12/15/2012.  FINDINGS: Elevation of the right hemidiaphragm relative to the left is unchanged. Lungs are clear. Heart size is normal. No pneumothorax or pleural effusion. Degenerative change about the shoulders is noted.  IMPRESSION: No acute disease.  Stable compared to prior exam.   Electronically Signed   By: Inge Rise M.D.   On: 02/23/2013 18:56   Ct Head Wo Contrast  02/23/2013   CLINICAL DATA:  Fatigue.  Weakness.  EXAM: CT HEAD WITHOUT CONTRAST  TECHNIQUE: Contiguous axial images were obtained from the base of the skull through the  vertex without intravenous contrast.  COMPARISON:  Brain MRI 12/20/2011.  FINDINGS: Chronic microvascular ischemic change and cortical atrophy are again seen. There is no evidence of acute abnormality including infarct, hemorrhage, mass lesion, mass effect, midline shift or abnormal extra-axial fluid collection. There is no hydrocephalus or pneumocephalus. Mild mucosal thickening left maxillary sinus is noted. The calvarium is intact.  IMPRESSION: No acute finding. Atrophy and chronic microvascular ischemic change.   Electronically Signed   By: Inge Rise M.D.   On: 02/23/2013 20:01   Mri Brain Without Contrast  02/24/2013   CLINICAL DATA:  Altered mental status. Confusion. End-stage renal disease. Suspected fever related encephalopathy. Diabetes mellitus and hypertension.  EXAM: MRI HEAD WITHOUT CONTRAST  TECHNIQUE: Multiplanar, multiecho pulse sequences of the brain and surrounding structures were obtained without intravenous contrast.  COMPARISON:  CT head 02/23/2013.  MR head 12/20/2011.  FINDINGS: The patient was unable to remain motionless for the exam. Small or subtle lesions could be overlooked.  No evidence for acute infarction, hemorrhage, mass lesion, hydrocephalus, or extra-axial fluid. Moderate cerebral and cerebellar atrophy. Mild to moderate subcortical and periventricular T2 and FLAIR hyperintensities, likely chronic microvascular ischemic change. Flow voids are maintained throughout the carotid, basilar, and vertebral arteries. Tiny focus of chronic hemorrhage right parietal subcortical white matter (image 138 series 8) likely related to chronic hypertension. Pituitary and cerebellar tonsils unremarkable.  In the upper cervical region there is image distortion due to hardware related to previous C3-C4 fusion. There is adjacent segment disc space narrowing at C2-C3 with moderate pannus surrounding the odontoid. The marrow signal in the C2 vertebral body is diffusely abnormal which may be due  to an inflammatory process. Comparison with the prior MR showed a similar appearance to the C2 vertebral body and C2-3 disc space, but some progression of pannus and abnormal marrow signal (edema) since 2013 is suspected on today's exam. In addition, there appears to be osseous spurring at C2-3 which compresses the upper cervical cord. Consider MRI of the cervical spine for further evaluation if there are signs and symptoms of myelopathy.  Calvarium and skullbase grossly unremarkable. Stable scalp lipomas in the occipital region. Bilateral proptosis due to increased orbital fat. Bilateral cataract extraction. Right middle and inferior nasal turbinate hypertrophy with slight fluid layering in the right greater than left maxillary sinuses.  Compared with 2013, similar  degree of atrophy and small vessel disease was noted.  IMPRESSION: No acute stroke or hemorrhage.  Moderate atrophy and chronic microvascular ischemic change, stable from 2013.  Spondylosis at C2-3 related to prior C3-C4 fusion along with pannus and abnormal marrow signal in the C2 vertebral body, likely inflammatory. Consider cervical spine MRI without contrast if there signs and symptoms referable to this region.   Electronically Signed   By: Rolla Flatten M.D.   On: 02/24/2013 08:32    Microbiology: Recent Results (from the past 240 hour(s))  CULTURE, BLOOD (ROUTINE X 2)     Status: None   Collection Time    02/23/13  6:25 PM      Result Value Ref Range Status   Specimen Description BLOOD RIGHT HAND   Final   Special Requests BOTTLES DRAWN AEROBIC AND ANAEROBIC 5CC   Final   Culture  Setup Time     Final   Value: 02/24/2013 03:31     Performed at [facility]   Culture     Final   Value:        BLOOD CULTURE RECEIVED NO GROWTH TO DATE CULTURE WILL BE HELD FOR 5 DAYS BEFORE ISSUING A FINAL NEGATIVE REPORT     Performed at [facility]   Report Status PENDING   Incomplete  CULTURE, BLOOD (ROUTINE X 2)     Status: None    Collection Time    02/23/13  6:39 PM      Result Value Ref Range Status   Specimen Description BLOOD HAND LEFT   Final   Special Requests BOTTLES DRAWN AEROBIC ONLY 5CC   Final   Culture  Setup Time     Final   Value: 02/24/2013 03:32     Performed at [facility]   Culture     Final   Value:        BLOOD CULTURE RECEIVED NO GROWTH TO DATE CULTURE WILL BE HELD FOR 5 DAYS BEFORE ISSUING A FINAL NEGATIVE REPORT     Performed at [facility]   Report Status PENDING   Incomplete  MRSA PCR SCREENING     Status: None   Collection Time    02/24/13  4:17 AM      Result Value Ref Range Status   MRSA by PCR NEGATIVE  NEGATIVE Final   Comment:            The GeneXpert MRSA Assay (FDA     approved for NASAL specimens     only), is one component of a     comprehensive MRSA colonization     surveillance program. It is not     intended to diagnose MRSA     infection nor to guide or     monitor treatment for     MRSA infections.     Labs: Basic Metabolic Panel:  Recent Labs Lab 02/23/13 1808 02/23/13 1852 02/24/13 0130 02/24/13 0425 02/25/13 0420 02/26/13 0714  NA 139 141 141 142 139 137  K 7.6* 4.6 4.7 4.8 3.9 3.9  CL 98 102 100 101 97 97  CO2 24  --  '24 26 26 28  ' GLUCOSE 138* 144* 135* 141* 125* 100*  BUN 25* 27* 28* 31* 22 24*  CREATININE 6.34* 6.70* 7.17* 7.56* 5.81* 5.97*  CALCIUM 9.3  --  9.0 8.8 8.5 8.5  PHOS  --   --   --   --  2.5 2.0*   Liver Function Tests:  Recent Labs Lab 02/23/13 1808 02/24/13 0425 02/25/13 0420 02/26/13 0714  AST 106* 13  --   --   ALT 21 10  --   --   ALKPHOS 40 46  --   --   BILITOT 0.4 0.4  --   --   PROT 8.1 6.7  --   --   ALBUMIN 3.3* 2.8* 2.8* 2.9*   No results found for this basename: LIPASE, AMYLASE,  in the last 168 hours  Recent Labs Lab 02/24/13 0130  AMMONIA 26   CBC:  Recent Labs Lab 02/23/13 1852 02/23/13 1931 02/24/13 0130 02/25/13 0420 02/26/13 0714  WBC  --  7.5 5.5 4.0 4.7  NEUTROABS   --  6.5 4.6  --   --   HGB 17.0 13.9 12.7* 11.8* 11.6*  HCT 50.0 44.8 41.9 39.0 37.6*  MCV  --  87.2 87.8 87.6 85.3  PLT  --  190 155 171 174   Cardiac Enzymes: No results found for this basename: CKTOTAL, CKMB, CKMBINDEX, TROPONINI,  in the last 168 hours BNP: BNP (last 3 results)  Recent Labs  03/18/12 0940  PROBNP 12648.0*   CBG:  Recent Labs Lab 02/25/13 2132 02/26/13 0727 02/26/13 1139 02/26/13 1703 02/26/13 2121  GLUCAP 156* 98 111* 135* 114*       Signed:  Doyt Castellana  [facility] 02/27/2013, 8:17 AM

## 2013-02-27 NOTE — Progress Notes (Signed)
Patient discharge Home per Md order with [facility]N.  Discharge instructions reviewed with patient and family.  Copies of all forms given and explained. Patient/family voiced understanding of all instructions.  Discharge in no acute distress.

## 2013-03-02 LAB — CULTURE, BLOOD (ROUTINE X 2)
CULTURE: NO GROWTH
Culture: NO GROWTH

## 2013-03-10 ENCOUNTER — Encounter: Payer: Self-pay | Admitting: Vascular Surgery

## 2013-03-11 ENCOUNTER — Ambulatory Visit: Payer: Medicare Other | Admitting: Vascular Surgery

## 2013-03-16 ENCOUNTER — Encounter (HOSPITAL_BASED_OUTPATIENT_CLINIC_OR_DEPARTMENT_OTHER): Payer: Medicare Other | Attending: General Surgery

## 2013-03-16 DIAGNOSIS — E1159 Type 2 diabetes mellitus with other circulatory complications: Secondary | ICD-10-CM | POA: Insufficient documentation

## 2013-03-16 DIAGNOSIS — G589 Mononeuropathy, unspecified: Secondary | ICD-10-CM | POA: Insufficient documentation

## 2013-03-16 DIAGNOSIS — S98919A Complete traumatic amputation of unspecified foot, level unspecified, initial encounter: Secondary | ICD-10-CM | POA: Insufficient documentation

## 2013-03-16 DIAGNOSIS — I96 Gangrene, not elsewhere classified: Secondary | ICD-10-CM | POA: Insufficient documentation

## 2013-03-16 DIAGNOSIS — L97509 Non-pressure chronic ulcer of other part of unspecified foot with unspecified severity: Secondary | ICD-10-CM | POA: Insufficient documentation

## 2013-03-23 ENCOUNTER — Ambulatory Visit (HOSPITAL_COMMUNITY)
Admission: RE | Admit: 2013-03-23 | Discharge: 2013-03-23 | Disposition: A | Discharge status: Home / Self Care | Payer: Medicare Other | Source: Ambulatory Visit | Attending: General Surgery | Admitting: General Surgery

## 2013-03-23 ENCOUNTER — Other Ambulatory Visit (HOSPITAL_BASED_OUTPATIENT_CLINIC_OR_DEPARTMENT_OTHER): Payer: Self-pay | Admitting: General Surgery

## 2013-03-23 ENCOUNTER — Ambulatory Visit (HOSPITAL_COMMUNITY)
Admission: RE | Admit: 2013-03-23 | Discharge: 2013-03-23 | Disposition: A | Discharge status: Home / Self Care | Payer: Medicare Other | Source: Ambulatory Visit | Attending: Hematology and Oncology | Admitting: Hematology and Oncology

## 2013-03-23 DIAGNOSIS — M869 Osteomyelitis, unspecified: Secondary | ICD-10-CM

## 2013-03-23 DIAGNOSIS — C9000 Multiple myeloma not having achieved remission: Secondary | ICD-10-CM | POA: Insufficient documentation

## 2013-03-23 DIAGNOSIS — S8990XA Unspecified injury of unspecified lower leg, initial encounter: Secondary | ICD-10-CM | POA: Insufficient documentation

## 2013-03-23 DIAGNOSIS — I700 Atherosclerosis of aorta: Secondary | ICD-10-CM | POA: Insufficient documentation

## 2013-03-23 DIAGNOSIS — S99919A Unspecified injury of unspecified ankle, initial encounter: Secondary | ICD-10-CM

## 2013-03-23 DIAGNOSIS — D472 Monoclonal gammopathy: Secondary | ICD-10-CM

## 2013-03-23 DIAGNOSIS — S99929A Unspecified injury of unspecified foot, initial encounter: Secondary | ICD-10-CM

## 2013-03-23 DIAGNOSIS — X58XXXA Exposure to other specified factors, initial encounter: Secondary | ICD-10-CM | POA: Insufficient documentation

## 2013-03-30 ENCOUNTER — Ambulatory Visit (HOSPITAL_BASED_OUTPATIENT_CLINIC_OR_DEPARTMENT_OTHER): Payer: Medicare Other | Admitting: Hematology and Oncology

## 2013-03-30 ENCOUNTER — Ambulatory Visit: Payer: Medicare Other

## 2013-03-30 ENCOUNTER — Telehealth: Payer: Self-pay | Admitting: Hematology and Oncology

## 2013-03-30 ENCOUNTER — Encounter: Payer: Self-pay | Admitting: Hematology and Oncology

## 2013-03-30 VITALS — BP 156/71 | HR 53 | Temp 98.2°F | Resp 18 | Ht 70.0 in | Wt 192.7 lb

## 2013-03-30 DIAGNOSIS — N186 End stage renal disease: Secondary | ICD-10-CM

## 2013-03-30 DIAGNOSIS — D472 Monoclonal gammopathy: Secondary | ICD-10-CM

## 2013-03-30 DIAGNOSIS — Z992 Dependence on renal dialysis: Secondary | ICD-10-CM

## 2013-03-30 DIAGNOSIS — D649 Anemia, unspecified: Secondary | ICD-10-CM

## 2013-03-30 NOTE — Progress Notes (Signed)
[facility]  NAME:  Jack [name], Jack [name]                 ACCOUNT NO.:  MEDICAL RECORD NO.:  [id number]      DATE OF BIRTH:  12-02-1945  PHYSICIAN:  Elesa Hacker, M.D.         VISIT DATE:  03/30/2013                                  OFFICE VISIT   To Whom it May Concern:  [name] is a diabetic male, age 68.  He is status post right transmetatarsal amputation.  While under our care, he has developed a wound on the left great toe, which has failed to improve, it is markedly increased in depth.  There is a great deal of necrosis of subcutaneous tissue and the bone is now exposed.  He clearly has a Wagner 3 ulcer. He may or may not require amputation, but he is a good candidate for hyperbaric oxygen treatment.     Elesa Hacker, M.D.     RA/MEDQ  D:  03/30/2013  T:  03/30/2013  Job:  [id number]

## 2013-03-30 NOTE — Telephone Encounter (Signed)
gv and printed aptp sched adn avs for pt for June

## 2013-03-30 NOTE — Progress Notes (Signed)
[facility] OFFICE PROGRESS NOTE  Patient Care Team: Lucianne Lei, MD as PCP - General (Family Medicine) Donetta Potts, MD (Nephrology)  DIAGNOSIS: MGUS  SUMMARY OF HEMATOLOGIC HISTORY: I have reviewed the patient's medical history extensively. This pleasant 67 year old gentleman, was found to have anemia and renal failure requiring dialysis he was in March of 2014 he underwent a bone marrow aspirate and biopsy which showed 12% plasma cell involvement. The patient was started on Revlimid/Velcade/dexamethasone. He received a second opinion in [facility] and was recommended not to undergo further treatment. In May of 2014, he underwent a kidney biopsy, which showed diabetic glomerulosclerosis. Since he was seen here, the patient was admitted to the hospital with osteomyelitis and required toe amputation.  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 male returns for further followup. The patient continue on hemodialysis 3 times a week. He still had multiple appointments to treat his poor healing wound in the toes. He denies any other new areas of bone pain.  I have reviewed the past medical history, past surgical history, social history and family history with the patient and they are unchanged from previous note.  ALLERGIES:  is allergic to lisinopril; ivp dye; and morphine and related.  MEDICATIONS:  Current Outpatient Prescriptions  Medication Sig Dispense Refill  . aspirin EC 81 MG tablet Take 81 mg by mouth daily.      Marland Kitchen b complex-vitamin c-folic acid (NEPHRO-VITE) 0.8 MG TABS tablet Take 1 tablet by mouth daily.      . Colchicine 0.6 MG CAPS Take 0.6 mg by mouth daily.      Marland Kitchen doxazosin (CARDURA) 2 MG tablet Take 2 mg by mouth at bedtime.      Marland Kitchen doxercalciferol (HECTOROL) 4 MCG/2ML injection Inject 0.5 mLs (1 mcg total) into the vein every Monday, Wednesday, and Friday with hemodialysis.  2 mL    . levothyroxine (SYNTHROID, LEVOTHROID) 175 MCG tablet  Take 1 tablet (175 mcg total) by mouth every morning.  30 tablet  0  . Nutritional Supplements (FEEDING SUPPLEMENT, NEPRO CARB STEADY,) LIQD Take 237 mLs by mouth 2 (two) times daily between meals.    0  . oxyCODONE (OXY IR/ROXICODONE) 5 MG immediate release tablet Take 5 mg by mouth every 6 (six) hours as needed for moderate pain.      Marland Kitchen SANTYL ointment Apply 1 application topically daily.       . saxagliptin HCl (ONGLYZA) 5 MG TABS tablet Take 5 mg by mouth at bedtime.       . sodium chloride 0.9 % SOLN 100 mL with ferric gluconate 12.5 MG/ML SOLN 125 mg Inject 125 mg into the vein every Monday, Wednesday, and Friday with hemodialysis.       No current facility-administered medications for this visit.    REVIEW OF SYSTEMS:   Constitutional: Denies fevers, chills or abnormal weight loss Eyes: Denies blurriness of vision Ears, nose, mouth, throat, and face: Denies mucositis or sore throat Respiratory: Denies cough, dyspnea or wheezes Cardiovascular: Denies palpitation, chest discomfort or lower extremity swelling Gastrointestinal:  Denies nausea, heartburn or change in bowel habits Skin: Denies abnormal skin rashes Lymphatics: Denies new lymphadenopathy or easy bruising Behavioral/Psych: Mood is stable, no new changes  All other systems were reviewed with the patient and are negative.  PHYSICAL EXAMINATION: ECOG PERFORMANCE STATUS: 2 - Symptomatic, <50% confined to bed  Filed Vitals:   03/30/13 1141  BP: 156/71  Pulse: 53  Temp: 98.2 F (36.8 C)  Resp: 18   Filed Weights   03/30/13 1141  Weight: 192 lb 11.2 oz (87.408 kg)    GENERAL:alert, no distress and comfortable SKIN: skin color, texture, turgor are normal, no rashes or significant lesions EYES: normal, Conjunctiva are pale and non-injected, sclera clear OROPHARYNX:no exudate, no erythema and lips, buccal mucosa, and tongue normal  NECK: supple, thyroid normal size, non-tender, without nodularity LYMPH:  no palpable  lymphadenopathy in the cervical, axillary or inguinal LUNGS: clear to auscultation and percussion with normal breathing effort HEART: regular rate & rhythm and no murmurs and no lower extremity edema ABDOMEN:abdomen soft, non-tender and normal bowel sounds Musculoskeletal: I did not examine his lower extremity. Bandages are in place.  NEURO: alert & oriented x 3 with fluent speech, no focal motor/sensory deficits  LABORATORY DATA:  I have reviewed the data as listed    Component Value Date/Time   NA 137 02/26/2013 0714   NA 139 09/29/2012 0916   K 3.9 02/26/2013 0714   K 4.0 09/29/2012 0916   CL 97 02/26/2013 0714   CL 97* 06/10/2012 1050   CO2 28 02/26/2013 0714   CO2 31* 09/29/2012 0916   GLUCOSE 100* 02/26/2013 0714   GLUCOSE 265* 09/29/2012 0916   GLUCOSE 244* 06/10/2012 1050   BUN 24* 02/26/2013 0714   BUN 29.0* 09/29/2012 0916   CREATININE 5.97* 02/26/2013 0714   CREATININE 7.3* 09/29/2012 0916   CALCIUM 8.5 02/26/2013 0714   CALCIUM 9.3 09/29/2012 0916   PROT 6.7 02/24/2013 0425   PROT 7.8 09/29/2012 0916   ALBUMIN 2.9* 02/26/2013 0714   ALBUMIN 3.6 09/29/2012 0916   AST 13 02/24/2013 0425   AST 13 09/29/2012 0916   ALT 10 02/24/2013 0425   ALT 15 09/29/2012 0916   ALKPHOS 46 02/24/2013 0425   ALKPHOS 76 09/29/2012 0916   BILITOT 0.4 02/24/2013 0425   BILITOT 0.39 09/29/2012 0916   GFRNONAA 9* 02/26/2013 0714   GFRAA 10* 02/26/2013 0714    No results found for this basename: SPEP,  UPEP,   kappa and lambda light chains    Lab Results  Component Value Date   WBC 4.7 02/26/2013   NEUTROABS 4.6 02/24/2013   HGB 11.6* 02/26/2013   HCT 37.6* 02/26/2013   MCV 85.3 02/26/2013   PLT 174 02/26/2013      Chemistry      Component Value Date/Time   NA 137 02/26/2013 0714   NA 139 09/29/2012 0916   K 3.9 02/26/2013 0714   K 4.0 09/29/2012 0916   CL 97 02/26/2013 0714   CL 97* 06/10/2012 1050   CO2 28 02/26/2013 0714   CO2 31* 09/29/2012 0916   BUN 24* 02/26/2013 0714   BUN 29.0* 09/29/2012 0916    CREATININE 5.97* 02/26/2013 0714   CREATININE 7.3* 09/29/2012 0916      Component Value Date/Time   CALCIUM 8.5 02/26/2013 0714   CALCIUM 9.3 09/29/2012 0916   ALKPHOS 46 02/24/2013 0425   ALKPHOS 76 09/29/2012 0916   AST 13 02/24/2013 0425   AST 13 09/29/2012 0916   ALT 10 02/24/2013 0425   ALT 15 09/29/2012 0916   BILITOT 0.4 02/24/2013 0425   BILITOT 0.39 09/29/2012 0916       RADIOGRAPHIC STUDIES: I reviewed his recent skeletal survey which show abnormalities in his skull in the right humerus, suspicious of multiple myeloma I have personally reviewed the radiological images as listed and agreed with the findings in the report.  ASSESSMENT & PLAN:  #  1 MGUS Is unclear to me whether the patient did have a smoldering myeloma back in March of 2014. He received chemotherapy briefly and since then has not received any further treatment after multiple expert opinions who recommended the patient to be observed only. He is completely asymptomatic and I think this is reasonable.  His most recent skeletal survey suggested a new lytic lesion. The patient is not in any shape or form to receive systemic chemotherapy if I were to diagnose him with multiple myeloma now. I recommend repeat the x-ray of the humerus in 3 months, full blood work and 24 hour urine collection to find out if the patient has truly progression of multiple myeloma on not. We will consider treatment and if he indeed have findings to suggest progression of disease. #2 end-stage renal disease His kidney biopsy from May did not show myeloma involvement. It is unclear whether this could be related to diabetes only whether he did have myeloma involvement that resolved after receiving chemotherapy. In any case he will continue hemodialysis as directed by his nephrologist #3 anemia This is due to anemia of chronic disease. He is much improved compared to his previous lab result. He is completely asymptomatic and I recommend observation  only. #4 poor wound healing with recent amputation of the toe The patient is still following with vascular surgery for wound care. I recommend he continue the same.  Orders Placed This Encounter  Procedures  . DG Humerus Right    Standing Status: Future     Number of Occurrences:      Standing Expiration Date: 03/30/2014    Order Specific Question:  Reason for Exam (SYMPTOM  OR DIAGNOSIS REQUIRED)    Answer:  right humerus, r/o myeloma    Order Specific Question:  Preferred imaging location?    Answer:  [facility]  . IFE, Urine (with Tot Prot)    Standing Status: Future     Number of Occurrences: 1     Standing Expiration Date: 03/30/2014  . Protein Electro, 24-Hour Urine    Standing Status: Future     Number of Occurrences: 1     Standing Expiration Date: 03/30/2014   All questions were answered. The patient knows to call the clinic with any problems, questions or concerns. No barriers to learning was detected. I spent 25 minutes counseling the patient face to face. The total time spent in the appointment was 40 minutes and more than 50% was on counseling and review of test results     [facility], [place], MD 03/30/2013 1:35 PM

## 2013-04-05 ENCOUNTER — Encounter: Payer: Self-pay | Admitting: Vascular Surgery

## 2013-04-06 ENCOUNTER — Ambulatory Visit (INDEPENDENT_AMBULATORY_CARE_PROVIDER_SITE_OTHER): Payer: Medicare Other | Admitting: Vascular Surgery

## 2013-04-06 ENCOUNTER — Encounter: Payer: Self-pay | Admitting: Vascular Surgery

## 2013-04-06 ENCOUNTER — Other Ambulatory Visit: Payer: Self-pay

## 2013-04-06 ENCOUNTER — Encounter (HOSPITAL_COMMUNITY): Payer: Self-pay | Admitting: *Deleted

## 2013-04-06 VITALS — BP 189/89 | HR 56 | Resp 18 | Ht 70.0 in | Wt 189.0 lb

## 2013-04-06 DIAGNOSIS — I739 Peripheral vascular disease, unspecified: Secondary | ICD-10-CM

## 2013-04-06 DIAGNOSIS — I96 Gangrene, not elsewhere classified: Secondary | ICD-10-CM | POA: Insufficient documentation

## 2013-04-06 LAB — GLUCOSE, CAPILLARY
GLUCOSE-CAPILLARY: 177 mg/dL — AB (ref 70–99)
GLUCOSE-CAPILLARY: 150 mg/dL — AB (ref 70–99)
Glucose-Capillary: 150 mg/dL — ABNORMAL HIGH (ref 70–99)
Glucose-Capillary: 166 mg/dL — ABNORMAL HIGH (ref 70–99)

## 2013-04-06 MED ORDER — DEXTROSE 5 % IV SOLN
1.5000 g | INTRAVENOUS | Status: AC
  Administered 2013-04-07: 1.5 g via INTRAVENOUS
  Filled 2013-04-06: qty 1.5

## 2013-04-06 NOTE — Progress Notes (Signed)
Subjective:     Patient ID: Jack [name], male   DOB: Jul 27, 1945, 68 y.o.   MRN: [id number]  HPI this 68 year old male is seen today for gangrene of the left first toe. Patient has a history of an aortogram performed by Dr. Juanda Crumble fields  in October 2014 for a nonhealing ulcer in the right foot. Angiogram reveals severe distal tibial disease bilaterally which is nonretractable. I have reviewed this. Patient had a right transmetatarsal amputation which needed VAC wound dressing for a few months but has ultimately healed. The patient has requested to see me today rather than Dr. Oneida Alar. He noticed an ulceration on the left first toe about 3 or 4 weeks ago. He has been receiving hyperbaric oxygen treatments. He denies chills and fever. Past Medical History  Diagnosis Date  . Hypertension   . Diabetes mellitus without complication   . Shortness of breath   . Pneumonia     2012  . Heart murmur   . Glaucoma   . Multiple myeloma, without mention of having achieved remission 03/30/2012    Cytogenetic neg on 03/23/2012.  . End stage renal disease on dialysis   . Asthma   . Hyperparathyroidism, secondary renal   . Peripheral arterial disease     History  Substance Use Topics  . Smoking status: Former Smoker    Types: Cigarettes    Quit date: 03/19/1982  . Smokeless tobacco: Never Used  . Alcohol Use: No     Comment: formerly    Family History  Problem Relation Age of Onset  . Diabetes Mother   . Cancer Mother     bone   . Kidney disease Father     Allergies  Allergen Reactions  . Lisinopril Cough  . Ivp Dye [Iodinated Diagnostic Agents] Hives  . Morphine And Related Other (See Comments)    Bradycardia states patient    Current outpatient prescriptions:aspirin EC 81 MG tablet, Take 81 mg by mouth daily., Disp: , Rfl: ;  b complex-vitamin c-folic acid (NEPHRO-VITE) 0.8 MG TABS tablet, Take 1 tablet by mouth daily., Disp: , Rfl: ;  Colchicine 0.6 MG CAPS, Take 0.6 mg by mouth  daily., Disp: , Rfl: ;  doxazosin (CARDURA) 2 MG tablet, Take 2 mg by mouth at bedtime., Disp: , Rfl:  doxercalciferol (HECTOROL) 4 MCG/2ML injection, Inject 0.5 mLs (1 mcg total) into the vein every Monday, Wednesday, and Friday with hemodialysis., Disp: 2 mL, Rfl: ;  levothyroxine (SYNTHROID, LEVOTHROID) 175 MCG tablet, Take 1 tablet (175 mcg total) by mouth every morning., Disp: 30 tablet, Rfl: 0;  Nutritional Supplements (FEEDING SUPPLEMENT, NEPRO CARB STEADY,) LIQD, Take 237 mLs by mouth 2 (two) times daily between meals., Disp: , Rfl: 0 oxyCODONE (OXY IR/ROXICODONE) 5 MG immediate release tablet, Take 5 mg by mouth every 6 (six) hours as needed for moderate pain., Disp: , Rfl: ;  SANTYL ointment, Apply 1 application topically daily. , Disp: , Rfl: ;  saxagliptin HCl (ONGLYZA) 5 MG TABS tablet, Take 5 mg by mouth at bedtime. , Disp: , Rfl:  sodium chloride 0.9 % SOLN 100 mL with ferric gluconate 12.5 MG/ML SOLN 125 mg, Inject 125 mg into the vein every Monday, Wednesday, and Friday with hemodialysis., Disp: , Rfl:   BP 189/89  Pulse 56  Resp 18  Ht '5\' 10"'  (1.778 m)  Wt 189 lb (85.73 kg)  BMI 27.12 kg/m2  Body mass index is 27.12 kg/(m^2).        Review of Systems  denies chest pain, dyspnea on exertion, PND, orthopnea, hemoptysis. Patient is on hemodialysis Monday Wednesday and Friday via left upper arm AV graft. Other systems negative and a complete review of systems    Objective:   Physical Exam BP 189/89  Pulse 56  Resp 18  Ht '5\' 10"'  (1.778 m)  Wt 189 lb (85.73 kg)  BMI 27.12 kg/m2  Gen.-alert and oriented x3 in no apparent distress HEENT normal for age Lungs no rhonchi or wheezing Cardiovascular regular rhythm no murmurs carotid pulses 3+ palpable no bruits audible Abdomen soft nontender no palpable masses Musculoskeletal free of  major deformities Skin clear -no rashes Neurologic normal Lower extremities 3+ femoral and and popliteal pulses palpable bilaterally. Right  lower extremity with healed transmetatarsal foot amputation Left foot with no palpable pedal pulses. Dry gangrene of the distal 2 cm left first toe with no proximal cellulitis. Skin is chronically ischemic in appearance back to flexion crease. Toes 2 through 5 appear healthy at this time.  I reviewed the angiogram performed 11/06/2012 and agree that there is no role for revascularization in this patient with severe distal tibial disease       Assessment:     Dry gangrene left first toe in patient with severe tibial occlusive disease End-stage renal disease on hemodialysis Monday Wednesday Friday Diabetes mellitus  Discussed situation with patient at length. Have given the option of either proceeding with left first transmetatarsal toe amputation versus continued observation on hyperbaric oxygen treatments. I have recommended proceeding with toe amputation but given patient both options and also explained that this may not heal and it may ultimately require a left below-knee amputation. He would like to proceed with amputation left first toe.    Plan:     Will schedule left first toe amputation tomorrow-this is patient's dialysis today so he will move dialysis Thursday this week. We'll check lab work in the a.m. to be certain potassium is okay prior to this toe amputation procedure.

## 2013-04-07 ENCOUNTER — Encounter (HOSPITAL_COMMUNITY)
Admission: RE | Disposition: A | Discharge status: Home / Self Care | Payer: Self-pay | Source: Ambulatory Visit | Attending: Vascular Surgery

## 2013-04-07 ENCOUNTER — Telehealth: Payer: Self-pay | Admitting: Vascular Surgery

## 2013-04-07 ENCOUNTER — Ambulatory Visit (HOSPITAL_COMMUNITY)
Admission: RE | Admit: 2013-04-07 | Discharge: 2013-04-07 | Disposition: A | Discharge status: Home / Self Care | Payer: Medicare Other | Source: Ambulatory Visit | Attending: Vascular Surgery | Admitting: Vascular Surgery

## 2013-04-07 ENCOUNTER — Encounter (HOSPITAL_COMMUNITY): Payer: Self-pay | Admitting: Certified Registered"

## 2013-04-07 ENCOUNTER — Encounter (HOSPITAL_COMMUNITY): Payer: Medicare Other | Admitting: Certified Registered"

## 2013-04-07 ENCOUNTER — Ambulatory Visit (HOSPITAL_COMMUNITY): Payer: Medicare Other | Admitting: Certified Registered"

## 2013-04-07 DIAGNOSIS — C9000 Multiple myeloma not having achieved remission: Secondary | ICD-10-CM | POA: Insufficient documentation

## 2013-04-07 DIAGNOSIS — Z7982 Long term (current) use of aspirin: Secondary | ICD-10-CM | POA: Insufficient documentation

## 2013-04-07 DIAGNOSIS — H409 Unspecified glaucoma: Secondary | ICD-10-CM | POA: Insufficient documentation

## 2013-04-07 DIAGNOSIS — E039 Hypothyroidism, unspecified: Secondary | ICD-10-CM | POA: Insufficient documentation

## 2013-04-07 DIAGNOSIS — E119 Type 2 diabetes mellitus without complications: Secondary | ICD-10-CM | POA: Insufficient documentation

## 2013-04-07 DIAGNOSIS — Z992 Dependence on renal dialysis: Secondary | ICD-10-CM | POA: Insufficient documentation

## 2013-04-07 DIAGNOSIS — N2581 Secondary hyperparathyroidism of renal origin: Secondary | ICD-10-CM | POA: Insufficient documentation

## 2013-04-07 DIAGNOSIS — R011 Cardiac murmur, unspecified: Secondary | ICD-10-CM | POA: Insufficient documentation

## 2013-04-07 DIAGNOSIS — I70269 Atherosclerosis of native arteries of extremities with gangrene, unspecified extremity: Secondary | ICD-10-CM | POA: Insufficient documentation

## 2013-04-07 DIAGNOSIS — J45909 Unspecified asthma, uncomplicated: Secondary | ICD-10-CM | POA: Insufficient documentation

## 2013-04-07 DIAGNOSIS — L97509 Non-pressure chronic ulcer of other part of unspecified foot with unspecified severity: Secondary | ICD-10-CM | POA: Insufficient documentation

## 2013-04-07 DIAGNOSIS — I120 Hypertensive chronic kidney disease with stage 5 chronic kidney disease or end stage renal disease: Secondary | ICD-10-CM | POA: Insufficient documentation

## 2013-04-07 DIAGNOSIS — I96 Gangrene, not elsewhere classified: Secondary | ICD-10-CM

## 2013-04-07 DIAGNOSIS — N186 End stage renal disease: Secondary | ICD-10-CM | POA: Insufficient documentation

## 2013-04-07 DIAGNOSIS — Z87891 Personal history of nicotine dependence: Secondary | ICD-10-CM | POA: Insufficient documentation

## 2013-04-07 HISTORY — PX: AMPUTATION: SHX166

## 2013-04-07 LAB — GLUCOSE, CAPILLARY
GLUCOSE-CAPILLARY: 98 mg/dL (ref 70–99)
GLUCOSE-CAPILLARY: 134 mg/dL — AB (ref 70–99)
Glucose-Capillary: 77 mg/dL (ref 70–99)

## 2013-04-07 LAB — POCT I-STAT 4, (NA,K, GLUC, HGB,HCT)
GLUCOSE: 109 mg/dL — AB (ref 70–99)
HCT: 38 % — ABNORMAL LOW (ref 39.0–52.0)
Hemoglobin: 12.9 g/dL — ABNORMAL LOW (ref 13.0–17.0)
Potassium: 4 mEq/L (ref 3.7–5.3)
Sodium: 141 mEq/L (ref 137–147)

## 2013-04-07 SURGERY — AMPUTATION DIGIT
Anesthesia: General | Site: Toe | Laterality: Left

## 2013-04-07 MED ORDER — LIDOCAINE HCL (CARDIAC) 20 MG/ML IV SOLN
INTRAVENOUS | Status: DC | PRN
  Administered 2013-04-07: 80 mg via INTRAVENOUS

## 2013-04-07 MED ORDER — LIDOCAINE HCL (CARDIAC) 20 MG/ML IV SOLN
INTRAVENOUS | Status: AC
  Filled 2013-04-07: qty 5

## 2013-04-07 MED ORDER — ONDANSETRON HCL 4 MG/2ML IJ SOLN
INTRAMUSCULAR | Status: AC
  Filled 2013-04-07: qty 2

## 2013-04-07 MED ORDER — MIDAZOLAM HCL 2 MG/2ML IJ SOLN
INTRAMUSCULAR | Status: AC
  Filled 2013-04-07: qty 2

## 2013-04-07 MED ORDER — 0.9 % SODIUM CHLORIDE (POUR BTL) OPTIME
TOPICAL | Status: DC | PRN
  Administered 2013-04-07: 1000 mL

## 2013-04-07 MED ORDER — OXYCODONE HCL 5 MG PO TABS: 5.0000 mg | ORAL_TABLET | Freq: Four times a day (QID) | ORAL | Status: DC | PRN

## 2013-04-07 MED ORDER — CHLORHEXIDINE GLUCONATE CLOTH 2 % EX PADS: 6.0000 | MEDICATED_PAD | Freq: Once | CUTANEOUS | Status: DC

## 2013-04-07 MED ORDER — PROPOFOL 10 MG/ML IV BOLUS
INTRAVENOUS | Status: AC
  Filled 2013-04-07: qty 20

## 2013-04-07 MED ORDER — FENTANYL CITRATE 0.05 MG/ML IJ SOLN
INTRAMUSCULAR | Status: AC
  Filled 2013-04-07: qty 5

## 2013-04-07 MED ORDER — DEXTROSE 50 % IV SOLN
25.0000 mL | Freq: Once | INTRAVENOUS | Status: AC
  Administered 2013-04-07: 25 mL via INTRAVENOUS

## 2013-04-07 MED ORDER — PROPOFOL 10 MG/ML IV BOLUS
INTRAVENOUS | Status: DC | PRN
  Administered 2013-04-07: 180 mg via INTRAVENOUS

## 2013-04-07 MED ORDER — FENTANYL CITRATE 0.05 MG/ML IJ SOLN
INTRAMUSCULAR | Status: DC | PRN
  Administered 2013-04-07: 50 ug via INTRAVENOUS

## 2013-04-07 MED ORDER — DEXTROSE 50 % IV SOLN
INTRAVENOUS | Status: AC
  Filled 2013-04-07: qty 50

## 2013-04-07 MED ORDER — SODIUM CHLORIDE 0.9 % IV SOLN
INTRAVENOUS | Status: DC
  Administered 2013-04-07: 11:00:00 via INTRAVENOUS

## 2013-04-07 MED ORDER — ONDANSETRON HCL 4 MG/2ML IJ SOLN
INTRAMUSCULAR | Status: DC | PRN
  Administered 2013-04-07: 4 mg via INTRAVENOUS

## 2013-04-07 SURGICAL SUPPLY — 37 items
BANDAGE ELASTIC 4 VELCRO ST LF (GAUZE/BANDAGES/DRESSINGS) ×3 IMPLANT
BANDAGE GAUZE ELAST BULKY 4 IN (GAUZE/BANDAGES/DRESSINGS) ×3 IMPLANT
BNDG GAUZE ELAST 4 BULKY (GAUZE/BANDAGES/DRESSINGS) ×3 IMPLANT
CANISTER SUCTION 2500CC (MISCELLANEOUS) ×3 IMPLANT
COVER SURGICAL LIGHT HANDLE (MISCELLANEOUS) ×3 IMPLANT
DRAPE EXTREMITY T 121X128X90 (DRAPE) ×3 IMPLANT
DRSG EMULSION OIL 3X3 NADH (GAUZE/BANDAGES/DRESSINGS) ×3 IMPLANT
ELECT REM PT RETURN 9FT ADLT (ELECTROSURGICAL) ×3
ELECTRODE REM PT RTRN 9FT ADLT (ELECTROSURGICAL) ×1 IMPLANT
GLOVE BIO SURGEON STRL SZ 6.5 (GLOVE) IMPLANT
GLOVE BIO SURGEONS STRL SZ 6.5 (GLOVE)
GLOVE BIOGEL PI IND STRL 6.5 (GLOVE) ×1 IMPLANT
GLOVE BIOGEL PI IND STRL 7.0 (GLOVE) ×2 IMPLANT
GLOVE BIOGEL PI INDICATOR 6.5 (GLOVE) ×2
GLOVE BIOGEL PI INDICATOR 7.0 (GLOVE) ×4
GLOVE ECLIPSE 6.5 STRL STRAW (GLOVE) ×3 IMPLANT
GLOVE SS BIOGEL STRL SZ 7 (GLOVE) ×2 IMPLANT
GLOVE SUPERSENSE BIOGEL SZ 7 (GLOVE) ×4
GOWN STRL REUS W/ TWL LRG LVL3 (GOWN DISPOSABLE) ×3 IMPLANT
GOWN STRL REUS W/TWL LRG LVL3 (GOWN DISPOSABLE) ×6
KIT BASIN OR (CUSTOM PROCEDURE TRAY) ×3 IMPLANT
KIT ROOM TURNOVER OR (KITS) ×3 IMPLANT
NS IRRIG 1000ML POUR BTL (IV SOLUTION) ×3 IMPLANT
PACK GENERAL/GYN (CUSTOM PROCEDURE TRAY) ×3 IMPLANT
PAD ARMBOARD 7.5X6 YLW CONV (MISCELLANEOUS) ×6 IMPLANT
SPECIMEN JAR SMALL (MISCELLANEOUS) ×3 IMPLANT
SPONGE GAUZE 4X4 12PLY (GAUZE/BANDAGES/DRESSINGS) ×3 IMPLANT
SUT ETHILON 3 0 PS 1 (SUTURE) ×6 IMPLANT
SUT VIC AB 3-0 SH 18 (SUTURE) IMPLANT
SUT VIC AB 3-0 SH 27 (SUTURE) ×4
SUT VIC AB 3-0 SH 27X BRD (SUTURE) ×2 IMPLANT
SWAB COLLECTION DEVICE MRSA (MISCELLANEOUS) IMPLANT
TOWEL OR 17X24 6PK STRL BLUE (TOWEL DISPOSABLE) ×3 IMPLANT
TOWEL OR 17X26 10 PK STRL BLUE (TOWEL DISPOSABLE) ×3 IMPLANT
TUBE ANAEROBIC SPECIMEN COL (MISCELLANEOUS) IMPLANT
UNDERPAD 30X30 INCONTINENT (UNDERPADS AND DIAPERS) ×3 IMPLANT
WATER STERILE IRR 1000ML POUR (IV SOLUTION) ×3 IMPLANT

## 2013-04-07 NOTE — Progress Notes (Signed)
Orthopedic Tech Progress Note Patient Details:  Jack [name] [birth year] [id number] Sized and applied Darco shoe to Lt. Foot.     Darrol Poke 04/07/2013, 3:34 PM

## 2013-04-07 NOTE — Telephone Encounter (Addendum)
Message copied by Gena Fray on Wed Apr 07, 2013  2:43 PM ------      Message from: Peter Minium K      Created: Wed Apr 07, 2013  2:10 PM      Regarding: Schedule                   ----- Message -----         From: Gabriel Earing, PA-C         Sent: 04/07/2013   1:54 PM           To: [facility] Charge Pool            S/p Transmetatarsal amputation left first toe 04/07/13.  F/u with Dr. Kellie Simmering in 2 weeks.  He will need suture removal.            Thanks,      Samantha ------  04/07/13: lm for pt re appt, dpm

## 2013-04-07 NOTE — H&P (View-Only) (Signed)
Subjective:     Patient ID: Jack [name], male   DOB: 01/09/1946, 68 y.o.   MRN: [id number]  HPI this 68 year old male is seen today for gangrene of the left first toe. Patient has a history of an aortogram performed by Dr. Juanda Crumble fields  in October 2014 for a nonhealing ulcer in the right foot. Angiogram reveals severe distal tibial disease bilaterally which is nonretractable. I have reviewed this. Patient had a right transmetatarsal amputation which needed VAC wound dressing for a few months but has ultimately healed. The patient has requested to see me today rather than Dr. Oneida Alar. He noticed an ulceration on the left first toe about 3 or 4 weeks ago. He has been receiving hyperbaric oxygen treatments. He denies chills and fever. Past Medical History  Diagnosis Date  . Hypertension   . Diabetes mellitus without complication   . Shortness of breath   . Pneumonia     2012  . Heart murmur   . Glaucoma   . Multiple myeloma, without mention of having achieved remission 03/30/2012    Cytogenetic neg on 03/23/2012.  . End stage renal disease on dialysis   . Asthma   . Hyperparathyroidism, secondary renal   . Peripheral arterial disease     History  Substance Use Topics  . Smoking status: Former Smoker    Types: Cigarettes    Quit date: 03/19/1982  . Smokeless tobacco: Never Used  . Alcohol Use: No     Comment: formerly    Family History  Problem Relation Age of Onset  . Diabetes Mother   . Cancer Mother     bone   . Kidney disease Father     Allergies  Allergen Reactions  . Lisinopril Cough  . Ivp Dye [Iodinated Diagnostic Agents] Hives  . Morphine And Related Other (See Comments)    Bradycardia states patient    Current outpatient prescriptions:aspirin EC 81 MG tablet, Take 81 mg by mouth daily., Disp: , Rfl: ;  b complex-vitamin c-folic acid (NEPHRO-VITE) 0.8 MG TABS tablet, Take 1 tablet by mouth daily., Disp: , Rfl: ;  Colchicine 0.6 MG CAPS, Take 0.6 mg by mouth  daily., Disp: , Rfl: ;  doxazosin (CARDURA) 2 MG tablet, Take 2 mg by mouth at bedtime., Disp: , Rfl:  doxercalciferol (HECTOROL) 4 MCG/2ML injection, Inject 0.5 mLs (1 mcg total) into the vein every Monday, Wednesday, and Friday with hemodialysis., Disp: 2 mL, Rfl: ;  levothyroxine (SYNTHROID, LEVOTHROID) 175 MCG tablet, Take 1 tablet (175 mcg total) by mouth every morning., Disp: 30 tablet, Rfl: 0;  Nutritional Supplements (FEEDING SUPPLEMENT, NEPRO CARB STEADY,) LIQD, Take 237 mLs by mouth 2 (two) times daily between meals., Disp: , Rfl: 0 oxyCODONE (OXY IR/ROXICODONE) 5 MG immediate release tablet, Take 5 mg by mouth every 6 (six) hours as needed for moderate pain., Disp: , Rfl: ;  SANTYL ointment, Apply 1 application topically daily. , Disp: , Rfl: ;  saxagliptin HCl (ONGLYZA) 5 MG TABS tablet, Take 5 mg by mouth at bedtime. , Disp: , Rfl:  sodium chloride 0.9 % SOLN 100 mL with ferric gluconate 12.5 MG/ML SOLN 125 mg, Inject 125 mg into the vein every Monday, Wednesday, and Friday with hemodialysis., Disp: , Rfl:   BP 189/89  Pulse 56  Resp 18  Ht '5\' 10"'  (1.778 m)  Wt 189 lb (85.73 kg)  BMI 27.12 kg/m2  Body mass index is 27.12 kg/(m^2).        Review of Systems  denies chest pain, dyspnea on exertion, PND, orthopnea, hemoptysis. Patient is on hemodialysis Monday Wednesday and Friday via left upper arm AV graft. Other systems negative and a complete review of systems    Objective:   Physical Exam BP 189/89  Pulse 56  Resp 18  Ht '5\' 10"'  (1.778 m)  Wt 189 lb (85.73 kg)  BMI 27.12 kg/m2  Gen.-alert and oriented x3 in no apparent distress HEENT normal for age Lungs no rhonchi or wheezing Cardiovascular regular rhythm no murmurs carotid pulses 3+ palpable no bruits audible Abdomen soft nontender no palpable masses Musculoskeletal free of  major deformities Skin clear -no rashes Neurologic normal Lower extremities 3+ femoral and and popliteal pulses palpable bilaterally. Right  lower extremity with healed transmetatarsal foot amputation Left foot with no palpable pedal pulses. Dry gangrene of the distal 2 cm left first toe with no proximal cellulitis. Skin is chronically ischemic in appearance back to flexion crease. Toes 2 through 5 appear healthy at this time.  I reviewed the angiogram performed 11/06/2012 and agree that there is no role for revascularization in this patient with severe distal tibial disease       Assessment:     Dry gangrene left first toe in patient with severe tibial occlusive disease End-stage renal disease on hemodialysis Monday Wednesday Friday Diabetes mellitus  Discussed situation with patient at length. Have given the option of either proceeding with left first transmetatarsal toe amputation versus continued observation on hyperbaric oxygen treatments. I have recommended proceeding with toe amputation but given patient both options and also explained that this may not heal and it may ultimately require a left below-knee amputation. He would like to proceed with amputation left first toe.    Plan:     Will schedule left first toe amputation tomorrow-this is patient's dialysis today so he will move dialysis Thursday this week. We'll check lab work in the a.m. to be certain potassium is okay prior to this toe amputation procedure.

## 2013-04-07 NOTE — Interval H&P Note (Signed)
History and Physical Interval Note:  04/07/2013 12:38 PM  Jack [name]  has presented today for surgery, with the diagnosis of Peripheral Vascular Disease with Gangrene Left 1st Toe  The various methods of treatment have been discussed with the patient and family. After consideration of risks, benefits and other options for treatment, the patient has consented to  Procedure(s): AMPUTATION DIGIT- LEFT 1ST TOE (Left) as a surgical intervention .  The patient's history has been reviewed, patient examined, no change in status, stable for surgery.  I have reviewed the patient's chart and labs.  Questions were answered to the patient's satisfaction.     [name]

## 2013-04-07 NOTE — Anesthesia Preprocedure Evaluation (Signed)
Anesthesia Evaluation  Patient identified by MRN, date of birth, ID band Patient awake    Reviewed: Allergy & Precautions, H&P , NPO status , Patient's Chart, lab work & pertinent test results  Airway Mallampati: II  Neck ROM: full    Dental   Pulmonary shortness of breath, asthma , former smoker,          Cardiovascular hypertension, + Peripheral Vascular Disease     Neuro/Psych    GI/Hepatic   Endo/Other  diabetes, Type 2Hypothyroidism   Renal/GU ESRF and DialysisRenal disease     Musculoskeletal   Abdominal   Peds  Hematology Multiple myeloma   Anesthesia Other Findings   Reproductive/Obstetrics                           Anesthesia Physical Anesthesia Plan  ASA: III  Anesthesia Plan: General   Post-op Pain Management:    Induction: Intravenous  Airway Management Planned: LMA  Additional Equipment:   Intra-op Plan:   Post-operative Plan:   Informed Consent: I have reviewed the patients History and Physical, chart, labs and discussed the procedure including the risks, benefits and alternatives for the proposed anesthesia with the patient or authorized representative who has indicated his/her understanding and acceptance.     Plan Discussed with: CRNA, Anesthesiologist and Surgeon  Anesthesia Plan Comments:         Anesthesia Quick Evaluation

## 2013-04-07 NOTE — Op Note (Signed)
OPERATIVE REPORT  Date of Surgery: 04/07/2013  Surgeon: [name], MD  Assistant: Nurse  Pre-op Diagnosis: Peripheral Vascular Disease with Gangrene Left 1st Toe  Post-op Diagnosis: Peripheral Vascular Disease with Gangrene Left 1st Toe  Procedure: Procedure(s): Transmetatarsal amputation left first toe  Anesthesia: General  EBL: 0  Complications: None  The patient was taken to operating room placed in supine position at which time satisfactory LMA general anesthesia was administered. The left foot and lower leg were prepped Betadine scrub and solution draped in routine sterile manner. There is dry gangrene involving the distal third of the left first toe with some dark skinned back to the flexion crease. Elliptical incision was then made at the base of the left first digit extending down over the metatarsal head medially. Is carried down through subcutaneous tissue and there was good bleeding from the subcutaneous tissue. The metatarsal phalangeal joint was disarticulated and the specimen was removed from the table. The metatarsal head was then shortened using the Bock. There was no evidence of any purulence or infection. Following this adequate hemostasis was achieved and the skin was easily approximated in 2 layers with interrupted 3-0 Vicryl for the subcutaneous and horizontal mattress sutures of 3-0 nylon for the skin. Sterile bulky dressing was applied the patient was taken to the recovery room in stable condition  Procedure Details:   [name], MD 04/07/2013 1:44 PM

## 2013-04-07 NOTE — Preoperative (Signed)
Beta Blockers   Reason not to administer Beta Blockers:Not Applicable 

## 2013-04-07 NOTE — Anesthesia Procedure Notes (Signed)
Procedure Name: LMA Insertion Date/Time: 04/07/2013 1:01 PM Performed by: Julian Reil Pre-anesthesia Checklist: Patient identified, Suction available, Emergency Drugs available and Patient being monitored Patient Re-evaluated:Patient Re-evaluated prior to inductionOxygen Delivery Method: Circle system utilized Preoxygenation: Pre-oxygenation with 100% oxygen Intubation Type: IV induction Ventilation: Mask ventilation without difficulty LMA: LMA inserted LMA Size: 5.0 Tube type: Oral Number of attempts: 1 Placement Confirmation: positive ETCO2 and breath sounds checked- equal and bilateral Tube secured with: Tape Dental Injury: Teeth and Oropharynx as per pre-operative assessment

## 2013-04-07 NOTE — Transfer of Care (Signed)
Immediate Anesthesia Transfer of Care Note  Patient: Jack [name]  Procedure(s) Performed: Procedure(s): AMPUTATION DIGIT- LEFT 1ST TOE (Left)  Patient Location: PACU  Anesthesia Type:General  Level of Consciousness: awake, alert , oriented and patient cooperative  Airway & Oxygen Therapy: Patient Spontanous Breathing and Patient connected to nasal cannula oxygen  Post-op Assessment: Report given to PACU RN, Post -op Vital signs reviewed and stable and Patient moving all extremities  Post vital signs: Reviewed and stable  Complications: No apparent anesthesia complications

## 2013-04-08 ENCOUNTER — Ambulatory Visit: Payer: Medicare Other | Admitting: Vascular Surgery

## 2013-04-08 NOTE — Anesthesia Postprocedure Evaluation (Signed)
Anesthesia Post Note  Patient: Jack [name]  Procedure(s) Performed: Procedure(s) (LRB): AMPUTATION DIGIT- LEFT 1ST TOE (Left)  Anesthesia type: General  Patient location: PACU  Post pain: Pain level controlled and Adequate analgesia  Post assessment: Post-op Vital signs reviewed, Patient's Cardiovascular Status Stable, Respiratory Function Stable, Patent Airway and Pain level controlled  Last Vitals:  Filed Vitals:   04/07/13 1438  BP: 152/62  Pulse: 47  Temp:   Resp:     Post vital signs: Reviewed and stable  Level of consciousness: awake, alert  and oriented  Complications: No apparent anesthesia complications

## 2013-04-09 ENCOUNTER — Encounter (HOSPITAL_COMMUNITY): Payer: Self-pay | Admitting: Vascular Surgery

## 2013-04-15 ENCOUNTER — Telehealth: Payer: Self-pay | Admitting: *Deleted

## 2013-04-15 DIAGNOSIS — I739 Peripheral vascular disease, unspecified: Secondary | ICD-10-CM

## 2013-04-15 NOTE — Telephone Encounter (Signed)
Miranda a nurse from [facility] called requesting an order for a three in one commode chair.  Order was entered in epic

## 2013-04-16 ENCOUNTER — Encounter (HOSPITAL_COMMUNITY): Payer: Self-pay | Admitting: Emergency Medicine

## 2013-04-16 ENCOUNTER — Inpatient Hospital Stay (HOSPITAL_COMMUNITY)
Admission: EM | Admit: 2013-04-16 | Discharge: 2013-04-30 | DRG: 856 | Disposition: A | Discharge status: Home Health | Payer: Medicare Other | Attending: Internal Medicine | Admitting: Internal Medicine

## 2013-04-16 DIAGNOSIS — E871 Hypo-osmolality and hyponatremia: Secondary | ICD-10-CM

## 2013-04-16 DIAGNOSIS — M86179 Other acute osteomyelitis, unspecified ankle and foot: Secondary | ICD-10-CM

## 2013-04-16 DIAGNOSIS — I739 Peripheral vascular disease, unspecified: Secondary | ICD-10-CM

## 2013-04-16 DIAGNOSIS — A419 Sepsis, unspecified organism: Secondary | ICD-10-CM

## 2013-04-16 DIAGNOSIS — R001 Bradycardia, unspecified: Secondary | ICD-10-CM

## 2013-04-16 DIAGNOSIS — R159 Full incontinence of feces: Secondary | ICD-10-CM | POA: Diagnosis not present

## 2013-04-16 DIAGNOSIS — I120 Hypertensive chronic kidney disease with stage 5 chronic kidney disease or end stage renal disease: Secondary | ICD-10-CM | POA: Diagnosis present

## 2013-04-16 DIAGNOSIS — I96 Gangrene, not elsewhere classified: Secondary | ICD-10-CM

## 2013-04-16 DIAGNOSIS — M79609 Pain in unspecified limb: Secondary | ICD-10-CM

## 2013-04-16 DIAGNOSIS — A4902 Methicillin resistant Staphylococcus aureus infection, unspecified site: Secondary | ICD-10-CM | POA: Diagnosis present

## 2013-04-16 DIAGNOSIS — R509 Fever, unspecified: Secondary | ICD-10-CM

## 2013-04-16 DIAGNOSIS — Z113 Encounter for screening for infections with a predominantly sexual mode of transmission: Secondary | ICD-10-CM

## 2013-04-16 DIAGNOSIS — Z87891 Personal history of nicotine dependence: Secondary | ICD-10-CM

## 2013-04-16 DIAGNOSIS — B9689 Other specified bacterial agents as the cause of diseases classified elsewhere: Secondary | ICD-10-CM | POA: Diagnosis present

## 2013-04-16 DIAGNOSIS — L089 Local infection of the skin and subcutaneous tissue, unspecified: Secondary | ICD-10-CM | POA: Diagnosis present

## 2013-04-16 DIAGNOSIS — E039 Hypothyroidism, unspecified: Secondary | ICD-10-CM

## 2013-04-16 DIAGNOSIS — H409 Unspecified glaucoma: Secondary | ICD-10-CM | POA: Diagnosis present

## 2013-04-16 DIAGNOSIS — M899 Disorder of bone, unspecified: Secondary | ICD-10-CM | POA: Diagnosis present

## 2013-04-16 DIAGNOSIS — E108 Type 1 diabetes mellitus with unspecified complications: Secondary | ICD-10-CM

## 2013-04-16 DIAGNOSIS — D631 Anemia in chronic kidney disease: Secondary | ICD-10-CM

## 2013-04-16 DIAGNOSIS — E119 Type 2 diabetes mellitus without complications: Secondary | ICD-10-CM

## 2013-04-16 DIAGNOSIS — R531 Weakness: Secondary | ICD-10-CM

## 2013-04-16 DIAGNOSIS — Z833 Family history of diabetes mellitus: Secondary | ICD-10-CM

## 2013-04-16 DIAGNOSIS — N189 Chronic kidney disease, unspecified: Secondary | ICD-10-CM

## 2013-04-16 DIAGNOSIS — R112 Nausea with vomiting, unspecified: Secondary | ICD-10-CM

## 2013-04-16 DIAGNOSIS — Y835 Amputation of limb(s) as the cause of abnormal reaction of the patient, or of later complication, without mention of misadventure at the time of the procedure: Secondary | ICD-10-CM | POA: Diagnosis present

## 2013-04-16 DIAGNOSIS — T8149XA Infection following a procedure, other surgical site, initial encounter: Secondary | ICD-10-CM

## 2013-04-16 DIAGNOSIS — R7881 Bacteremia: Secondary | ICD-10-CM

## 2013-04-16 DIAGNOSIS — L98499 Non-pressure chronic ulcer of skin of other sites with unspecified severity: Secondary | ICD-10-CM

## 2013-04-16 DIAGNOSIS — S98119A Complete traumatic amputation of unspecified great toe, initial encounter: Secondary | ICD-10-CM

## 2013-04-16 DIAGNOSIS — N185 Chronic kidney disease, stage 5: Secondary | ICD-10-CM

## 2013-04-16 DIAGNOSIS — D472 Monoclonal gammopathy: Secondary | ICD-10-CM

## 2013-04-16 DIAGNOSIS — T8140XA Infection following a procedure, unspecified, initial encounter: Principal | ICD-10-CM

## 2013-04-16 DIAGNOSIS — Z4931 Encounter for adequacy testing for hemodialysis: Secondary | ICD-10-CM

## 2013-04-16 DIAGNOSIS — J45909 Unspecified asthma, uncomplicated: Secondary | ICD-10-CM | POA: Diagnosis present

## 2013-04-16 DIAGNOSIS — Z992 Dependence on renal dialysis: Secondary | ICD-10-CM

## 2013-04-16 DIAGNOSIS — H269 Unspecified cataract: Secondary | ICD-10-CM | POA: Diagnosis present

## 2013-04-16 DIAGNOSIS — Z48812 Encounter for surgical aftercare following surgery on the circulatory system: Secondary | ICD-10-CM

## 2013-04-16 DIAGNOSIS — G9340 Encephalopathy, unspecified: Secondary | ICD-10-CM

## 2013-04-16 DIAGNOSIS — J9601 Acute respiratory failure with hypoxia: Secondary | ICD-10-CM

## 2013-04-16 DIAGNOSIS — E611 Iron deficiency: Secondary | ICD-10-CM

## 2013-04-16 DIAGNOSIS — E872 Acidosis, unspecified: Secondary | ICD-10-CM

## 2013-04-16 DIAGNOSIS — E869 Volume depletion, unspecified: Secondary | ICD-10-CM

## 2013-04-16 DIAGNOSIS — Z8614 Personal history of Methicillin resistant Staphylococcus aureus infection: Secondary | ICD-10-CM | POA: Diagnosis present

## 2013-04-16 DIAGNOSIS — I10 Essential (primary) hypertension: Secondary | ICD-10-CM | POA: Diagnosis present

## 2013-04-16 DIAGNOSIS — M949 Disorder of cartilage, unspecified: Secondary | ICD-10-CM

## 2013-04-16 DIAGNOSIS — N039 Chronic nephritic syndrome with unspecified morphologic changes: Secondary | ICD-10-CM

## 2013-04-16 DIAGNOSIS — I330 Acute and subacute infective endocarditis: Secondary | ICD-10-CM | POA: Diagnosis present

## 2013-04-16 DIAGNOSIS — E1069 Type 1 diabetes mellitus with other specified complication: Secondary | ICD-10-CM | POA: Diagnosis present

## 2013-04-16 DIAGNOSIS — N186 End stage renal disease: Secondary | ICD-10-CM

## 2013-04-16 DIAGNOSIS — E8770 Fluid overload, unspecified: Secondary | ICD-10-CM

## 2013-04-16 DIAGNOSIS — A4101 Sepsis due to Methicillin susceptible Staphylococcus aureus: Secondary | ICD-10-CM

## 2013-04-16 DIAGNOSIS — N2581 Secondary hyperparathyroidism of renal origin: Secondary | ICD-10-CM | POA: Diagnosis present

## 2013-04-16 DIAGNOSIS — M869 Osteomyelitis, unspecified: Secondary | ICD-10-CM

## 2013-04-16 DIAGNOSIS — R197 Diarrhea, unspecified: Secondary | ICD-10-CM

## 2013-04-16 DIAGNOSIS — C9001 Multiple myeloma in remission: Secondary | ICD-10-CM | POA: Diagnosis present

## 2013-04-16 DIAGNOSIS — D638 Anemia in other chronic diseases classified elsewhere: Secondary | ICD-10-CM | POA: Diagnosis present

## 2013-04-16 LAB — CBC WITH DIFFERENTIAL/PLATELET
BASOS ABS: 0 10*3/uL (ref 0.0–0.1)
Basophils Relative: 0 % (ref 0–1)
EOS ABS: 0 10*3/uL (ref 0.0–0.7)
Eosinophils Relative: 0 % (ref 0–5)
HCT: 35.4 % — ABNORMAL LOW (ref 39.0–52.0)
Hemoglobin: 11 g/dL — ABNORMAL LOW (ref 13.0–17.0)
LYMPHS ABS: 0.5 10*3/uL — AB (ref 0.7–4.0)
LYMPHS PCT: 4 % — AB (ref 12–46)
MCH: 27 pg (ref 26.0–34.0)
MCHC: 31.1 g/dL (ref 30.0–36.0)
MCV: 86.8 fL (ref 78.0–100.0)
Monocytes Absolute: 1.1 10*3/uL — ABNORMAL HIGH (ref 0.1–1.0)
Monocytes Relative: 8 % (ref 3–12)
NEUTROS PCT: 88 % — AB (ref 43–77)
Neutro Abs: 12.3 10*3/uL — ABNORMAL HIGH (ref 1.7–7.7)
PLATELETS: 197 10*3/uL (ref 150–400)
RBC: 4.08 MIL/uL — ABNORMAL LOW (ref 4.22–5.81)
RDW: 15.2 % (ref 11.5–15.5)
WBC: 14 10*3/uL — AB (ref 4.0–10.5)

## 2013-04-16 LAB — BASIC METABOLIC PANEL
BUN: 18 mg/dL (ref 6–23)
CO2: 30 mEq/L (ref 19–32)
Calcium: 8.6 mg/dL (ref 8.4–10.5)
Chloride: 93 mEq/L — ABNORMAL LOW (ref 96–112)
Creatinine, Ser: 4 mg/dL — ABNORMAL HIGH (ref 0.50–1.35)
GFR calc Af Amer: 16 mL/min — ABNORMAL LOW (ref >90)
GFR calc non Af Amer: 14 mL/min — ABNORMAL LOW (ref >90)
GLUCOSE: 230 mg/dL — AB (ref 70–99)
POTASSIUM: 3.1 meq/L — AB (ref 3.7–5.3)
SODIUM: 138 meq/L (ref 137–147)

## 2013-04-16 NOTE — ED Notes (Signed)
Pt from home. Had left big toe amputation performed on 04/07/13 due to DM II complications. Went to dialysis today and dialysis RN concerned that amputation site is infected. Pt has low grade fever, denies pain to the site.

## 2013-04-16 NOTE — ED Notes (Signed)
Per [facility] - pt went to dialysis today, recent left toe amputation, dialysis staff concerned pt's amputation may be infected. Pt c/o mild left foot pain.

## 2013-04-16 NOTE — H&P (Addendum)
[facility]                                                                                    Patient Demographics  Jack [name], is a 68 y.o. male  CSN: [id number]  MRN: [id number]  DOB - 1945/05/18  Admit Date - 04/16/2013  Outpatient Primary MD for the patient is [name], MD   With History of -  Past Medical History  Diagnosis Date  . Hypertension   . Diabetes mellitus without complication   . Shortness of breath   . Pneumonia     2012  . Heart murmur   . Glaucoma   . Multiple myeloma, without mention of having achieved remission 03/30/2012    Cytogenetic neg on 03/23/2012.  . End stage renal disease on dialysis   . Asthma   . Hyperparathyroidism, secondary renal   . Peripheral arterial disease       Past Surgical History  Procedure Laterality Date  . Thyroidectomy    . Cervical disc surgery    . Eye surgery      CATARACTS  . Insertion of dialysis catheter Right 03/19/2012    Procedure: INSERTION OF DIALYSIS CATHETER;  Surgeon: Mal Misty, MD;  Location: [place];  Service: Vascular;  Laterality: Right;  Right Internal Jugular  . Av fistula placement Left 03/25/2012    Procedure: ARTERIOVENOUS (AV) FISTULA CREATION;  Surgeon: Mal Misty, MD;  Location: [place];  Service: Vascular;  Laterality: Left;  . Ligation of competing branches of arteriovenous fistula Left 05/08/2012    Procedure: LIGATION OF COMPETING BRANCHES OF ARTERIOVENOUS FISTULA;  Surgeon: Mal Misty, MD;  Location: [place];  Service: Vascular;  Laterality: Left;  Ultrasound guided  . Cardiac catheterization      approx 30 years ago  . Amputation Right 11/10/2012    Procedure: AMPUTATION FIRST and SECOND TOES Right Foot;  Surgeon: Elam Dutch, MD;  Location: [facility] OR;  Service: Vascular;  Laterality: Right;  . Transmetatarsal amputation Left 12/16/2012    Procedure: TRANSMETATARSAL AMPUTATION AND VAC PLACEMENT;  Surgeon: Elam Dutch, MD;  Location: [place];  Service:  Vascular;  Laterality: Left;  . Amputation Left 04/07/2013    Procedure: AMPUTATION DIGIT- LEFT 1ST TOE;  Surgeon: Mal Misty, MD;  Location: [place];  Service: Vascular;  Laterality: Left;    in for   Chief Complaint  Patient presents with  . Wound Infection     HPI  Jack [name]  is a 68 y.o. male, with past medical history significant for end-stage renal disease on hemodialysis and peripheral vascular disease, who had a recent first  toe amputation of the left foot on March 25 presenting with fever, swelling and pain and redness in the surgical site area. In the emergency room the patient was noted to have a white blood cell count of 14,000 with a temperature of 100.9.  I was asked to admit for postop wound infection and possible below knee amputation on the left by vascular surgery.    Review of Systems    In addition to the HPI above,  No Fever-chills, No Headache, No changes  with Vision or hearing, No problems swallowing food or Liquids, No Chest pain, Cough or Shortness of Breath, No Abdominal pain, No Nausea or Vommitting, Bowel movements are regular, No Blood in stool or Urine, No dysuria, No new skin rashes or bruises, No new weakness, tingling, numbness in any extremity, No recent weight gain or loss, No polyuria, polydypsia or polyphagia, No significant Mental Stressors.  A full 10 point Review of Systems was done, except as stated above, all other Review of Systems were negative.   Social History History  Substance Use Topics  . Smoking status: Former Smoker    Types: Cigarettes    Quit date: 03/19/1982  . Smokeless tobacco: Never Used  . Alcohol Use: No     Comment: formerly     Family History Family History  Problem Relation Age of Onset  . Diabetes Mother   . Cancer Mother     bone   . Kidney disease Father      Prior to Admission medications   Medication Sig Start Date End Date Taking? Authorizing Provider  aspirin EC 81 MG tablet  Take 81 mg by mouth daily.   Yes Historical Provider, MD  b complex-vitamin c-folic acid (NEPHRO-VITE) 0.8 MG TABS tablet Take 1 tablet by mouth daily.   Yes Historical Provider, MD  colchicine 0.6 MG tablet Take 0.6 mg by mouth daily.   Yes Historical Provider, MD  doxazosin (CARDURA) 2 MG tablet Take 2 mg by mouth at bedtime.   Yes Historical Provider, MD  doxercalciferol (HECTOROL) 4 MCG/2ML injection Inject 0.5 mLs (1 mcg total) into the vein every Monday, Wednesday, and Friday with hemodialysis. 02/27/13  Yes Geradine Girt, DO  levothyroxine (SYNTHROID, LEVOTHROID) 175 MCG tablet Take 175 mcg by mouth daily before breakfast.   Yes Historical Provider, MD  nebivolol (BYSTOLIC) 5 MG tablet Take 5 mg by mouth at bedtime.   Yes Historical Provider, MD  Nutritional Supplements (FEEDING SUPPLEMENT, NEPRO CARB STEADY,) LIQD Take 237 mLs by mouth 2 (two) times daily between meals. 02/27/13  Yes Geradine Girt, DO  oxyCODONE (OXY IR/ROXICODONE) 5 MG immediate release tablet Take 5 mg by mouth every 6 (six) hours as needed for severe pain.   Yes Historical Provider, MD  saxagliptin HCl (ONGLYZA) 5 MG TABS tablet Take 5 mg by mouth at bedtime.    Yes Historical Provider, MD  sodium chloride 0.9 % SOLN 100 mL with ferric gluconate 12.5 MG/ML SOLN 125 mg Inject 125 mg into the vein every Monday, Wednesday, and Friday with hemodialysis. 02/27/13  Yes Geradine Girt, DO    Allergies  Allergen Reactions  . Lisinopril Cough  . Ivp Dye [Iodinated Diagnostic Agents] Hives  . Morphine And Related Other (See Comments)    Bradycardia states patient    Physical Exam  Vitals  Blood pressure 141/60, pulse 86, temperature 100.9 F (38.3 C), temperature source Oral, SpO2 93.00%.   1. General elderly African American gentleman looking very tired  2. depressed, Not Suicidal or Homicidal, Awake Alert, Oriented X 3.  3. No F.N deficits, ALL C.Nerves Intact, Strength 5/5 all 4 extremities,  Plantars down  going.  4. Ears and Eyes appear Normal, Conjunctivae clear, PERRLA. Moist Oral Mucosa.  5. Supple Neck, No JVD, No cervical lymphadenopathy appriciated, No Carotid Bruits.  6. Symmetrical Chest wall movement, Good air movement bilaterally, CTAB.  7. RRR, No Gallops, Rubs or Murmurs, No Parasternal Heave.  8. Positive Bowel Sounds, Abdomen Soft, Non tender, No organomegaly  appriciated,No rebound -guarding or rigidity.  9.  left foot wound noted with erythema and purulent drainage from the amputation site of the left first toe. Patient is also status post right sump foot amputation a few months ago.  10. Good muscle tone,  joints appear normal , no effusions, Normal ROM.  11. No Palpable Lymph Nodes in Neck or Axillae    Data Review  CBC  Recent Labs Lab 04/16/13 2017  WBC 14.0*  HGB 11.0*  HCT 35.4*  PLT 197  MCV 86.8  MCH 27.0  MCHC 31.1  RDW 15.2  LYMPHSABS 0.5*  MONOABS 1.1*  EOSABS 0.0  BASOSABS 0.0   ------------------------------------------------------------------------------------------------------------------  Chemistries   Recent Labs Lab 04/16/13 2017  NA 138  K 3.1*  CL 93*  CO2 30  GLUCOSE 230*  BUN 18  CREATININE 4.00*  CALCIUM 8.6        ----------------------------------------------------------------------------------------------------------------     Imaging results:   Dg Bone Survey Met  03/23/2013   CLINICAL DATA:  History of multiple myeloma, no current complaints  EXAM: METASTATIC BONE SURVEY  COMPARISON:  DG BONE SURVEY MET dated 03/20/2012; CT HEAD W/O CM dated 02/23/2013; DG BONE SURVEY MET dated 01/28/2011  FINDINGS: There is a grossly unchanged approximately 8 mm lytic lesion within the mid aspect of the calvarium, best seen on the provided lateral radiograph, likely unchanged since the prior examination.  Apparent development of a ill-defined lytic lesion within the proximal diaphysis of the right humerus measure approximately  8 mm in diameter.  Vertebral body heights appear grossly preserved. Post C3-C4 ACDF without definite evidence of hardware failure or loosening. Moderate DDD of C2-C3 and L4-L5 with disc space height loss, endplate irregularities sclerosis. There is partial ossification of the nuchal ligament.  Mild to moderate degenerative change of the bilateral AC and glenohumeral joints.  Vascular calcifications.  Grossly unchanged enlarged cardiac silhouette and mediastinal contours with atherosclerotic plaque within the thoracic aorta. There is mild elevation the right hemidiaphragm. Interval removal of right jugular approach dialysis catheter. Surgical clips are seen within the left antecubital fossa, possibly at the location of a dialysis graft or fistula.  IMPRESSION: 1. Possible new approximately 8 mm lytic lesion within the proximal diaphysis of the right humerus. 2. Grossly unchanged approximately 8 mm possible lytic lesion within the calvarium.   Electronically Signed   By: Sandi Mariscal M.D.   On: 03/23/2013 12:01   Dg Toe Great Left  03/23/2013   CLINICAL DATA:  Nonhealing wound  EXAM: LEFT GREAT TOE  COMPARISON:  None.  FINDINGS: There is a soft tissue defect about the distal tip of the great digit. While this soft tissue defect appears to nearly abut the tuft of the distal phalanx, this finding is without definitive associated osteolysis to suggest osteomyelitis. No radiopaque foreign body. No subcutaneous emphysema.  No fracture or dislocation. Vascular calcifications. Joint spaces are preserved.  IMPRESSION: Soft tissue defect nearly abuts the tuft of the great toe but is without definitive evidence of osteomyelitis. Further evaluation with MRI could be performed as clinically indicated.   Electronically Signed   By: Sandi Mariscal M.D.   On: 03/23/2013 12:05      Assessment & Plan  1. left foot surgical site infection. Patient is status first toe amputation on March 25  For gangrene by Dr. Kellie Simmering 2.  End-stage renal disease on hemodialysis 3. Hypertension 4. Diabetes mellitus  Plan Admit the patient to [place] Start vancomycin and Zosyn IV N.p.o. after  midnight Clear liquid diet for now Insulin sliding scale Wound culture Blood culture vascular surgery aware of admission   DVT Prophylaxis Heparin  AM Labs Ordered, also please review Full Orders  Family Communication: Admission, patients condition and plan of care including tests being ordered have been discussed with the patient and daughter who indicate understanding and agree with the plan and Code Status.  Code Status full  Disposition Plan: Home with home health  Time spent in minutes : 35 minutes  Condition GUARDED   '@SIGNATURE' @

## 2013-04-17 ENCOUNTER — Inpatient Hospital Stay (HOSPITAL_COMMUNITY): Payer: Medicare Other

## 2013-04-17 ENCOUNTER — Encounter (HOSPITAL_COMMUNITY): Payer: Self-pay | Admitting: *Deleted

## 2013-04-17 DIAGNOSIS — R509 Fever, unspecified: Secondary | ICD-10-CM | POA: Diagnosis present

## 2013-04-17 DIAGNOSIS — G9340 Encephalopathy, unspecified: Secondary | ICD-10-CM

## 2013-04-17 DIAGNOSIS — N186 End stage renal disease: Secondary | ICD-10-CM

## 2013-04-17 LAB — BASIC METABOLIC PANEL
BUN: 21 mg/dL (ref 6–23)
CALCIUM: 8.9 mg/dL (ref 8.4–10.5)
CO2: 26 meq/L (ref 19–32)
Chloride: 91 mEq/L — ABNORMAL LOW (ref 96–112)
Creatinine, Ser: 4.73 mg/dL — ABNORMAL HIGH (ref 0.50–1.35)
GFR, EST AFRICAN AMERICAN: 13 mL/min — AB (ref >90)
GFR, EST NON AFRICAN AMERICAN: 11 mL/min — AB (ref >90)
Glucose, Bld: 150 mg/dL — ABNORMAL HIGH (ref 70–99)
Potassium: 3.4 mEq/L — ABNORMAL LOW (ref 3.7–5.3)
SODIUM: 137 meq/L (ref 137–147)

## 2013-04-17 LAB — CBC
HCT: 36.2 % — ABNORMAL LOW (ref 39.0–52.0)
HCT: 36.2 % — ABNORMAL LOW (ref 39.0–52.0)
Hemoglobin: 11.1 g/dL — ABNORMAL LOW (ref 13.0–17.0)
Hemoglobin: 11.2 g/dL — ABNORMAL LOW (ref 13.0–17.0)
MCH: 26.6 pg (ref 26.0–34.0)
MCH: 26.8 pg (ref 26.0–34.0)
MCHC: 30.7 g/dL (ref 30.0–36.0)
MCHC: 30.9 g/dL (ref 30.0–36.0)
MCV: 86.6 fL (ref 78.0–100.0)
MCV: 86.8 fL (ref 78.0–100.0)
PLATELETS: 207 10*3/uL (ref 150–400)
Platelets: 217 10*3/uL (ref 150–400)
RBC: 4.17 MIL/uL — AB (ref 4.22–5.81)
RBC: 4.18 MIL/uL — AB (ref 4.22–5.81)
RDW: 15.3 % (ref 11.5–15.5)
RDW: 15.4 % (ref 11.5–15.5)
WBC: 14.7 10*3/uL — AB (ref 4.0–10.5)
WBC: 14.8 10*3/uL — AB (ref 4.0–10.5)

## 2013-04-17 LAB — SURGICAL PCR SCREEN
MRSA, PCR: NEGATIVE
Staphylococcus aureus: NEGATIVE

## 2013-04-17 LAB — CREATININE, SERUM
CREATININE: 4.92 mg/dL — AB (ref 0.50–1.35)
GFR, EST AFRICAN AMERICAN: 13 mL/min — AB (ref >90)
GFR, EST NON AFRICAN AMERICAN: 11 mL/min — AB (ref >90)

## 2013-04-17 LAB — GLUCOSE, CAPILLARY
GLUCOSE-CAPILLARY: 136 mg/dL — AB (ref 70–99)
GLUCOSE-CAPILLARY: 156 mg/dL — AB (ref 70–99)
Glucose-Capillary: 136 mg/dL — ABNORMAL HIGH (ref 70–99)
Glucose-Capillary: 128 mg/dL — ABNORMAL HIGH (ref 70–99)
Glucose-Capillary: 170 mg/dL — ABNORMAL HIGH (ref 70–99)

## 2013-04-17 LAB — PROTIME-INR
INR: 1.31 (ref 0.00–1.49)
Prothrombin Time: 16 seconds — ABNORMAL HIGH (ref 11.6–15.2)

## 2013-04-17 LAB — CBG MONITORING, ED: Glucose-Capillary: 160 mg/dL — ABNORMAL HIGH (ref 70–99)

## 2013-04-17 MED ORDER — RENA-VITE PO TABS
1.0000 | ORAL_TABLET | Freq: Every day | ORAL | Status: DC
  Administered 2013-04-17 – 2013-04-22 (×7): 1 via ORAL
  Administered 2013-04-23: 23:00:00 via ORAL
  Filled 2013-04-17 (×9): qty 1

## 2013-04-17 MED ORDER — SODIUM CHLORIDE 0.9 % IJ SOLN
3.0000 mL | Freq: Two times a day (BID) | INTRAMUSCULAR | Status: DC
  Administered 2013-04-17 – 2013-04-29 (×20): 3 mL via INTRAVENOUS

## 2013-04-17 MED ORDER — COLCHICINE 0.6 MG PO TABS
0.6000 mg | ORAL_TABLET | Freq: Every day | ORAL | Status: DC
  Administered 2013-04-17 – 2013-04-24 (×6): 0.6 mg via ORAL
  Filled 2013-04-17 (×8): qty 1

## 2013-04-17 MED ORDER — ONDANSETRON HCL 4 MG PO TABS: 4.0000 mg | ORAL_TABLET | Freq: Four times a day (QID) | ORAL | Status: DC | PRN

## 2013-04-17 MED ORDER — SODIUM CHLORIDE 0.9 % IJ SOLN: 3.0000 mL | INTRAMUSCULAR | Status: DC | PRN

## 2013-04-17 MED ORDER — ACETAMINOPHEN 650 MG RE SUPP: 650.0000 mg | Freq: Four times a day (QID) | RECTAL | Status: DC | PRN

## 2013-04-17 MED ORDER — LEVOTHYROXINE SODIUM 175 MCG PO TABS
175.0000 ug | ORAL_TABLET | Freq: Every day | ORAL | Status: DC
  Administered 2013-04-17 – 2013-04-29 (×13): 175 ug via ORAL
  Filled 2013-04-17 (×15): qty 1

## 2013-04-17 MED ORDER — DOXERCALCIFEROL 4 MCG/2ML IV SOLN
1.0000 ug | INTRAVENOUS | Status: DC
  Administered 2013-04-19 – 2013-04-21 (×2): 1 ug via INTRAVENOUS
  Filled 2013-04-17 (×4): qty 2

## 2013-04-17 MED ORDER — DOXAZOSIN MESYLATE 2 MG PO TABS
2.0000 mg | ORAL_TABLET | Freq: Every day | ORAL | Status: DC
  Administered 2013-04-17 – 2013-04-26 (×11): 2 mg via ORAL
  Filled 2013-04-17 (×12): qty 1

## 2013-04-17 MED ORDER — VANCOMYCIN HCL 10 G IV SOLR
2000.0000 mg | Freq: Once | INTRAVENOUS | Status: AC
  Administered 2013-04-17: 2000 mg via INTRAVENOUS
  Filled 2013-04-17: qty 2000

## 2013-04-17 MED ORDER — INSULIN ASPART 100 UNIT/ML ~~LOC~~ SOLN
0.0000 [IU] | Freq: Every day | SUBCUTANEOUS | Status: DC
  Administered 2013-04-22: 2 [IU] via SUBCUTANEOUS

## 2013-04-17 MED ORDER — ACETAMINOPHEN 325 MG PO TABS
650.0000 mg | ORAL_TABLET | Freq: Four times a day (QID) | ORAL | Status: DC | PRN
  Administered 2013-04-17 – 2013-04-19 (×4): 650 mg via ORAL
  Filled 2013-04-17 (×4): qty 2

## 2013-04-17 MED ORDER — HEPARIN SODIUM (PORCINE) 5000 UNIT/ML IJ SOLN
5000.0000 [IU] | Freq: Three times a day (TID) | INTRAMUSCULAR | Status: DC
  Administered 2013-04-17 – 2013-04-30 (×34): 5000 [IU] via SUBCUTANEOUS
  Filled 2013-04-17 (×42): qty 1

## 2013-04-17 MED ORDER — SODIUM CHLORIDE 0.9 % IV SOLN
250.0000 mL | INTRAVENOUS | Status: DC | PRN
  Administered 2013-04-26: 18:00:00 via INTRAVENOUS

## 2013-04-17 MED ORDER — OXYCODONE HCL 5 MG PO TABS
5.0000 mg | ORAL_TABLET | ORAL | Status: DC | PRN
  Administered 2013-04-17: 5 mg via ORAL

## 2013-04-17 MED ORDER — INSULIN ASPART 100 UNIT/ML ~~LOC~~ SOLN
0.0000 [IU] | Freq: Three times a day (TID) | SUBCUTANEOUS | Status: DC
  Administered 2013-04-17 – 2013-04-18 (×4): 1 [IU] via SUBCUTANEOUS
  Administered 2013-04-20: 3 [IU] via SUBCUTANEOUS
  Administered 2013-04-21 – 2013-04-22 (×3): 2 [IU] via SUBCUTANEOUS
  Administered 2013-04-22 – 2013-04-23 (×2): 1 [IU] via SUBCUTANEOUS
  Administered 2013-04-23: 2 [IU] via SUBCUTANEOUS
  Administered 2013-04-24 (×2): 1 [IU] via SUBCUTANEOUS
  Administered 2013-04-24: 2 [IU] via SUBCUTANEOUS
  Administered 2013-04-25 – 2013-04-27 (×3): 1 [IU] via SUBCUTANEOUS
  Administered 2013-04-27: 2 [IU] via SUBCUTANEOUS
  Administered 2013-04-28 (×2): 1 [IU] via SUBCUTANEOUS
  Administered 2013-04-29: 3 [IU] via SUBCUTANEOUS
  Administered 2013-04-29: 1 [IU] via SUBCUTANEOUS
  Administered 2013-04-30: 2 [IU] via SUBCUTANEOUS

## 2013-04-17 MED ORDER — NEPRO/CARBSTEADY PO LIQD
237.0000 mL | Freq: Two times a day (BID) | ORAL | Status: DC
  Administered 2013-04-17 – 2013-04-30 (×10): 237 mL via ORAL

## 2013-04-17 MED ORDER — OXYCODONE HCL 5 MG PO TABS
5.0000 mg | ORAL_TABLET | Freq: Four times a day (QID) | ORAL | Status: DC | PRN
  Administered 2013-04-18 – 2013-04-29 (×15): 5 mg via ORAL
  Filled 2013-04-17 (×16): qty 1

## 2013-04-17 MED ORDER — ACETAMINOPHEN 325 MG PO TABS
325.0000 mg | ORAL_TABLET | Freq: Once | ORAL | Status: AC
  Administered 2013-04-17: 325 mg via ORAL
  Filled 2013-04-17: qty 1

## 2013-04-17 MED ORDER — NEBIVOLOL HCL 5 MG PO TABS
5.0000 mg | ORAL_TABLET | Freq: Every day | ORAL | Status: DC
  Administered 2013-04-17 – 2013-04-29 (×14): 5 mg via ORAL
  Filled 2013-04-17 (×15): qty 1

## 2013-04-17 MED ORDER — HYDROMORPHONE HCL PF 1 MG/ML IJ SOLN: 1.0000 mg | INTRAMUSCULAR | Status: DC | PRN

## 2013-04-17 MED ORDER — PIPERACILLIN-TAZOBACTAM IN DEX 2-0.25 GM/50ML IV SOLN
2.2500 g | Freq: Three times a day (TID) | INTRAVENOUS | Status: DC
  Administered 2013-04-17 – 2013-04-19 (×7): 2.25 g via INTRAVENOUS
  Filled 2013-04-17 (×9): qty 50

## 2013-04-17 MED ORDER — SODIUM CHLORIDE 0.9 % IV SOLN: 125.0000 mg | INTRAVENOUS | Status: DC

## 2013-04-17 MED ORDER — VANCOMYCIN HCL IN DEXTROSE 1-5 GM/200ML-% IV SOLN
1000.0000 mg | INTRAVENOUS | Status: DC
  Administered 2013-04-19 – 2013-04-30 (×6): 1000 mg via INTRAVENOUS
  Filled 2013-04-17 (×11): qty 200

## 2013-04-17 MED ORDER — ONDANSETRON HCL 4 MG/2ML IJ SOLN
4.0000 mg | Freq: Four times a day (QID) | INTRAMUSCULAR | Status: DC | PRN
  Administered 2013-04-17 – 2013-04-30 (×2): 4 mg via INTRAVENOUS
  Filled 2013-04-17 (×2): qty 2

## 2013-04-17 NOTE — ED Notes (Signed)
Attempted to call report

## 2013-04-17 NOTE — Progress Notes (Signed)
[facility] PROGRESS NOTE Interim History: Mr. Levinson. He has a very complex medical history with diabetic foot infection. He was found to have gangrene of his left great toe. 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 He underwent amputation of his left great toe by Dr. Kellie Simmering on 04/07/2013   Assessment/Plan: Fever, unspecified/left foot surgical site infection after surgery - Started on Vanc and zosyn on 4.4.2015. - cont to spike fevers, consulted vascular may need debridement. - check MRI of foot. BC pending.  Diabetes mellitus type 1, controlled, with complications - Controlled. - cont SSI.  Essential hypertension, benign - stable cont home emds.  ESRD: - per renal for HD MWF.  Anemia of chronic disease; - per renal.  Code Status: full Family Communication: daughters  Disposition Plan: inpatinet   Consultants:  Renal  Vascular  Procedures:  MRI  Antibiotics:  vanc zosyn (indicate start date, and stop date if known)  HPI/Subjective: Sleepy no compains  Objective: Filed Vitals:   04/17/13 0000 04/17/13 0100 04/17/13 0634 04/17/13 0956  BP: 159/59 166/69 144/63 135/59  Pulse: 71 73 78 69  Temp:  102.5 F (39.2 C) 102.3 F (39.1 C) 99.4 F (37.4 C)  TempSrc:  Oral Oral Oral  Resp:  18 17 20   Height:  5\' 10"  (1.778 m)    Weight:  83.553 kg (184 lb 3.2 oz)    SpO2: 96% 95% 95% 99%    Intake/Output Summary (Last 24 hours) at 04/17/13 1012 Last data filed at 04/17/13 0700  Gross per 24 hour  Intake    170 ml  Output      0 ml  Net    170 ml   Filed Weights   04/17/13 0100  Weight: 83.553 kg (184 lb 3.2 oz)    Exam:  General: Alert, awake, oriented x3, in no acute distress.  HEENT: No bruits, no goiter.  Heart: Regular rate and rhythm, without murmurs, rubs, gallops.  Lungs: Good air movement, clear Abdomen: Soft, nontender, nondistended, positive bowel sounds.   Data Reviewed: Basic  Metabolic Panel:  Recent Labs Lab 04/16/13 2017 04/17/13 0550  NA 138 137  K 3.1* 3.4*  CL 93* 91*  CO2 30 26  GLUCOSE 230* 150*  BUN 18 21  CREATININE 4.00* 4.92*  4.73*  CALCIUM 8.6 8.9   Liver Function Tests: No results found for this basename: AST, ALT, ALKPHOS, BILITOT, PROT, ALBUMIN,  in the last 168 hours No results found for this basename: LIPASE, AMYLASE,  in the last 168 hours No results found for this basename: AMMONIA,  in the last 168 hours CBC:  Recent Labs Lab 04/16/13 2017 04/17/13 0550  WBC 14.0* 14.8*  14.7*  NEUTROABS 12.3*  --   HGB 11.0* 11.2*  11.1*  HCT 35.4* 36.2*  36.2*  MCV 86.8 86.6  86.8  PLT 197 207  217   Cardiac Enzymes: No results found for this basename: CKTOTAL, CKMB, CKMBINDEX, TROPONINI,  in the last 168 hours BNP (last 3 results) No results found for this basename: PROBNP,  in the last 8760 hours CBG:  Recent Labs Lab 04/17/13 0012 04/17/13 0139 04/17/13 0831  GLUCAP 160* 136* 136*    Recent Results (from the past 240 hour(s))  SURGICAL PCR SCREEN     Status: None   Collection Time    04/17/13  5:10 AM      Result Value Ref Range Status   MRSA, PCR NEGATIVE  NEGATIVE Final   Staphylococcus  aureus NEGATIVE  NEGATIVE Final   Comment:            The Xpert SA Assay (FDA     approved for NASAL specimens     in patients over 91 years of age),     is one component of     a comprehensive surveillance     program.  Test performance has     been validated by [employer] for patients greater     than or equal to 51 year old.     It is not intended     to diagnose infection nor to     guide or monitor treatment.     Studies: No results found.  Scheduled Meds: . colchicine  0.6 mg Oral Daily  . doxazosin  2 mg Oral QHS  . [START ON 04/19/2013] doxercalciferol  1 mcg Intravenous Q M,W,F-HD  . feeding supplement (NEPRO CARB STEADY)  237 mL Oral BID BM  . [START ON 04/19/2013] ferric gluconate (FERRLECIT/NULECIT)  IV  125 mg Intravenous Q M,W,F-HD  . heparin  5,000 Units Subcutaneous 3 times per day  . insulin aspart  0-5 Units Subcutaneous QHS  . insulin aspart  0-9 Units Subcutaneous TID WC  . levothyroxine  175 mcg Oral QAC breakfast  . multivitamin  1 tablet Oral QHS  . nebivolol  5 mg Oral QHS  . piperacillin-tazobactam (ZOSYN)  IV  2.25 g Intravenous 3 times per day  . sodium chloride  3 mL Intravenous Q12H  . [START ON 04/19/2013] vancomycin  1,000 mg Intravenous Q M,W,F-HD   Continuous Infusions:    Charlynne Cousins  [facility] Pager [phone]. If 8PM-8AM, please contact night-coverage at [email or web address], password [facility] 04/17/2013, 10:12 AM  LOS: 1 day

## 2013-04-17 NOTE — Consult Note (Signed)
Reason for Consult: Continuity of ESRD care Referring Physician: Charlynne Cousins MD [facility])  HPI: 68 year old African American man with past medical history significant for ESRD on dialysis (M./W./F), multiple myeloma in remission, diabetes mellitus and peripheral vascular disease with amputation of left hallux done last week. He was brought in with sporadic fevers noted by the wound care nurse at home together with increasing disorientation and confusion noted by his daughters. He was admitted for intravenous antibiotics/wound care of possible infected surgical wound.  Dialysis prescription: Monday/Wednesday/Friday at [facility]. 4 hours, BFR 400/DFR 800, EDW 85.5 kg, Hectorol 1 mcg 3 days a week, heparin 8700 units bolus. Left transposed brachiobasilic fistula.  Past Medical History  Diagnosis Date  . Hypertension   . Diabetes mellitus without complication   . Shortness of breath   . Pneumonia     2012  . Heart murmur   . Glaucoma   . Multiple myeloma, without mention of having achieved remission 03/30/2012    Cytogenetic neg on 03/23/2012.  . End stage renal disease on dialysis   . Asthma   . Hyperparathyroidism, secondary renal   . Peripheral arterial disease     Past Surgical History  Procedure Laterality Date  . Thyroidectomy    . Cervical disc surgery    . Eye surgery      CATARACTS  . Insertion of dialysis catheter Right 03/19/2012    Procedure: INSERTION OF DIALYSIS CATHETER;  Surgeon: Mal Misty, MD;  Location: [place];  Service: Vascular;  Laterality: Right;  Right Internal Jugular  . Av fistula placement Left 03/25/2012    Procedure: ARTERIOVENOUS (AV) FISTULA CREATION;  Surgeon: Mal Misty, MD;  Location: [place];  Service: Vascular;  Laterality: Left;  . Ligation of competing branches of arteriovenous fistula Left 05/08/2012    Procedure: LIGATION OF COMPETING BRANCHES OF ARTERIOVENOUS FISTULA;  Surgeon: Mal Misty, MD;  Location: [place];   Service: Vascular;  Laterality: Left;  Ultrasound guided  . Cardiac catheterization      approx 30 years ago  . Amputation Right 11/10/2012    Procedure: AMPUTATION FIRST and SECOND TOES Right Foot;  Surgeon: Elam Dutch, MD;  Location: [facility] OR;  Service: Vascular;  Laterality: Right;  . Transmetatarsal amputation Left 12/16/2012    Procedure: TRANSMETATARSAL AMPUTATION AND VAC PLACEMENT;  Surgeon: Elam Dutch, MD;  Location: [place];  Service: Vascular;  Laterality: Left;  . Amputation Left 04/07/2013    Procedure: AMPUTATION DIGIT- LEFT 1ST TOE;  Surgeon: Mal Misty, MD;  Location: [facility] OR;  Service: Vascular;  Laterality: Left;    Family History  Problem Relation Age of Onset  . Diabetes Mother   . Cancer Mother     bone   . Kidney disease Father     Social History:  reports that he quit smoking about 31 years ago. His smoking use included Cigarettes. He smoked 0.00 packs per day. He has never used smokeless tobacco. He reports that he does not drink alcohol or use illicit drugs.  Allergies:  Allergies  Allergen Reactions  . Lisinopril Cough  . Ivp Dye [Iodinated Diagnostic Agents] Hives  . Morphine And Related Other (See Comments)    Bradycardia states patient    Medications:  Scheduled: . colchicine  0.6 mg Oral Daily  . doxazosin  2 mg Oral QHS  . [START ON 04/19/2013] doxercalciferol  1 mcg Intravenous Q M,W,F-HD  . feeding supplement (NEPRO CARB STEADY)  237 mL Oral  BID BM  . [START ON 04/19/2013] ferric gluconate (FERRLECIT/NULECIT) IV  125 mg Intravenous Q M,W,F-HD  . heparin  5,000 Units Subcutaneous 3 times per day  . insulin aspart  0-5 Units Subcutaneous QHS  . insulin aspart  0-9 Units Subcutaneous TID WC  . levothyroxine  175 mcg Oral QAC breakfast  . multivitamin  1 tablet Oral QHS  . nebivolol  5 mg Oral QHS  . piperacillin-tazobactam (ZOSYN)  IV  2.25 g Intravenous 3 times per day  . sodium chloride  3 mL Intravenous Q12H  . [START ON 04/19/2013]  vancomycin  1,000 mg Intravenous Q M,W,F-HD    Results for orders placed during the hospital encounter of 04/16/13 (from the past 48 hour(s))  CBC WITH DIFFERENTIAL     Status: Abnormal   Collection Time    04/16/13  8:17 PM      Result Value Ref Range   WBC 14.0 (*) 4.0 - 10.5 K/uL   RBC 4.08 (*) 4.22 - 5.81 MIL/uL   Hemoglobin 11.0 (*) 13.0 - 17.0 g/dL   HCT 35.4 (*) 39.0 - 52.0 %   MCV 86.8  78.0 - 100.0 fL   MCH 27.0  26.0 - 34.0 pg   MCHC 31.1  30.0 - 36.0 g/dL   RDW 15.2  11.5 - 15.5 %   Platelets 197  150 - 400 K/uL   Neutrophils Relative % 88 (*) 43 - 77 %   Neutro Abs 12.3 (*) 1.7 - 7.7 K/uL   Lymphocytes Relative 4 (*) 12 - 46 %   Lymphs Abs 0.5 (*) 0.7 - 4.0 K/uL   Monocytes Relative 8  3 - 12 %   Monocytes Absolute 1.1 (*) 0.1 - 1.0 K/uL   Eosinophils Relative 0  0 - 5 %   Eosinophils Absolute 0.0  0.0 - 0.7 K/uL   Basophils Relative 0  0 - 1 %   Basophils Absolute 0.0  0.0 - 0.1 K/uL  BASIC METABOLIC PANEL     Status: Abnormal   Collection Time    04/16/13  8:17 PM      Result Value Ref Range   Sodium 138  137 - 147 mEq/L   Potassium 3.1 (*) 3.7 - 5.3 mEq/L   Chloride 93 (*) 96 - 112 mEq/L   CO2 30  19 - 32 mEq/L   Glucose, Bld 230 (*) 70 - 99 mg/dL   BUN 18  6 - 23 mg/dL   Creatinine, Ser 4.00 (*) 0.50 - 1.35 mg/dL   Calcium 8.6  8.4 - 10.5 mg/dL   GFR calc non Af Amer 14 (*) >90 mL/min   GFR calc Af Amer 16 (*) >90 mL/min   Comment: (NOTE)     The eGFR has been calculated using the CKD EPI equation.     This calculation has not been validated in all clinical situations.     eGFR's persistently <90 mL/min signify possible Chronic Kidney     Disease.  CBG MONITORING, ED     Status: Abnormal   Collection Time    04/17/13 12:12 AM      Result Value Ref Range   Glucose-Capillary 160 (*) 70 - 99 mg/dL   Comment 1 Notify RN     Comment 2 Documented in Chart    GLUCOSE, CAPILLARY     Status: Abnormal   Collection Time    04/17/13  1:39 AM      Result  Value Ref Range   Glucose-Capillary  136 (*) 70 - 99 mg/dL  SURGICAL PCR SCREEN     Status: None   Collection Time    04/17/13  5:10 AM      Result Value Ref Range   MRSA, PCR NEGATIVE  NEGATIVE   Staphylococcus aureus NEGATIVE  NEGATIVE   Comment:            The Xpert SA Assay (FDA     approved for NASAL specimens     in patients over 61 years of age),     is one component of     a comprehensive surveillance     program.  Test performance has     been validated by [employer] for patients greater     than or equal to 85 year old.     It is not intended     to diagnose infection nor to     guide or monitor treatment.  CBC     Status: Abnormal   Collection Time    04/17/13  5:50 AM      Result Value Ref Range   WBC 14.8 (*) 4.0 - 10.5 K/uL   RBC 4.18 (*) 4.22 - 5.81 MIL/uL   Hemoglobin 11.2 (*) 13.0 - 17.0 g/dL   HCT 36.2 (*) 39.0 - 52.0 %   MCV 86.6  78.0 - 100.0 fL   MCH 26.8  26.0 - 34.0 pg   MCHC 30.9  30.0 - 36.0 g/dL   RDW 15.4  11.5 - 15.5 %   Platelets 207  150 - 400 K/uL  CREATININE, SERUM     Status: Abnormal   Collection Time    04/17/13  5:50 AM      Result Value Ref Range   Creatinine, Ser 4.92 (*) 0.50 - 1.35 mg/dL   GFR calc non Af Amer 11 (*) >90 mL/min   GFR calc Af Amer 13 (*) >90 mL/min   Comment: (NOTE)     The eGFR has been calculated using the CKD EPI equation.     This calculation has not been validated in all clinical situations.     eGFR's persistently <90 mL/min signify possible Chronic Kidney     Disease.  BASIC METABOLIC PANEL     Status: Abnormal   Collection Time    04/17/13  5:50 AM      Result Value Ref Range   Sodium 137  137 - 147 mEq/L   Potassium 3.4 (*) 3.7 - 5.3 mEq/L   Chloride 91 (*) 96 - 112 mEq/L   CO2 26  19 - 32 mEq/L   Glucose, Bld 150 (*) 70 - 99 mg/dL   BUN 21  6 - 23 mg/dL   Creatinine, Ser 4.73 (*) 0.50 - 1.35 mg/dL   Calcium 8.9  8.4 - 10.5 mg/dL   GFR calc non Af Amer 11 (*) >90 mL/min   GFR calc Af Amer  13 (*) >90 mL/min   Comment: (NOTE)     The eGFR has been calculated using the CKD EPI equation.     This calculation has not been validated in all clinical situations.     eGFR's persistently <90 mL/min signify possible Chronic Kidney     Disease.  CBC     Status: Abnormal   Collection Time    04/17/13  5:50 AM      Result Value Ref Range   WBC 14.7 (*) 4.0 - 10.5 K/uL   RBC 4.17 (*) 4.22 -  5.81 MIL/uL   Hemoglobin 11.1 (*) 13.0 - 17.0 g/dL   HCT 36.2 (*) 39.0 - 52.0 %   MCV 86.8  78.0 - 100.0 fL   MCH 26.6  26.0 - 34.0 pg   MCHC 30.7  30.0 - 36.0 g/dL   RDW 15.3  11.5 - 15.5 %   Platelets 217  150 - 400 K/uL  PROTIME-INR     Status: Abnormal   Collection Time    04/17/13  5:50 AM      Result Value Ref Range   Prothrombin Time 16.0 (*) 11.6 - 15.2 seconds   INR 1.31  0.00 - 1.49  GLUCOSE, CAPILLARY     Status: Abnormal   Collection Time    04/17/13  8:31 AM      Result Value Ref Range   Glucose-Capillary 136 (*) 70 - 99 mg/dL    No results found.  Review of Systems  Constitutional: Positive for fever and chills. Negative for weight loss, malaise/fatigue and diaphoresis.  HENT: Negative.   Eyes: Negative.   Respiratory: Negative.   Cardiovascular: Negative.   Gastrointestinal: Negative.   Genitourinary: Negative.   Musculoskeletal: Negative for back pain and falls.       Left foot/leg pain  Skin: Negative.   Neurological: Negative for weakness.       Confusion and disorientation for the past 2 days  Endo/Heme/Allergies: Negative.   Psychiatric/Behavioral: Negative.    Blood pressure 135/59, pulse 69, temperature 99.4 F (37.4 C), temperature source Oral, resp. rate 20, height '5\' 10"'  (1.778 m), weight 83.553 kg (184 lb 3.2 oz), SpO2 99.00%. Physical Exam  Nursing note and vitals reviewed. Constitutional: He is oriented to person, place, and time. He appears well-developed and well-nourished. No distress.  HENT:  Head: Normocephalic and atraumatic.  Left Ear:  External ear normal.  Mouth/Throat: No oropharyngeal exudate.  Eyes: Conjunctivae and EOM are normal. Pupils are equal, round, and reactive to light. No scleral icterus.  Neck: Normal range of motion. Neck supple. No JVD present. No tracheal deviation present. No thyromegaly present.  Cardiovascular: Normal rate and regular rhythm.   Murmur heard. Respiratory: Effort normal. No respiratory distress. He has no wheezes. He has no rales.  GI: Soft. Bowel sounds are normal. He exhibits no distension. There is no tenderness. There is no rebound and no guarding.  Musculoskeletal: Normal range of motion. He exhibits no edema and no tenderness.  Left forefoot in clean/dry gauze dressing. Right leg s/p TMT amputation.  Neurological: He is alert and oriented to person, place, and time.  Skin: Skin is warm and dry. No rash noted. No erythema.  Psychiatric: He has a normal mood and affect.    Assessment/Plan: 1. Fever/encephalopathy 10 days Status post recent left hallux amputation with probable surgical wound infection: Started on broad-spectrum antibiotic therapy and has been evaluated by Dr. Donnetta Hutching of vascular surgery. No imminent need for wound exploration at this point and may need debridement if he does not progress. 2. End-stage renal disease: He last went to dialysis yesterday, completed his treatment without problems. Plan for next dialysis on Monday.  3. Metabolic bone disease: Resume phosphorus binders and Hectorol. 4. Anemia of chronic disease: Hemoglobin surprisingly within normal range in spite of anticipated resistance to ESA from ongoing wound/infection 5. Hypertension: Blood pressures well controlled on current regime, continue to monitor.  Jack [name] K. 04/17/2013, 11:19 AM

## 2013-04-17 NOTE — Consult Note (Signed)
Patient name: Jack [name] MRN: [id number] DOB: 09-29-1945 Sex: male     Reason for referral:  Chief Complaint  Patient presents with  . Wound Infection    HISTORY OF PRESENT ILLNESS: Appreciate triad hospitalist admitting Jack [name]. He has a very complex medical history with diabetic foot infection. He was found to have gangrene of his left great toe. 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 He underwent amputation of his left great toe by Dr. Kellie Simmering on 04/07/2013. He presented to the emergency department yesterday with erythema and low-grade temp to 100.5 range. He was admitted for IV antibiotics and limited activity.  Past Medical History  Diagnosis Date  . Hypertension   . Diabetes mellitus without complication   . Shortness of breath   . Pneumonia     2012  . Heart murmur   . Glaucoma   . Multiple myeloma, without mention of having achieved remission 03/30/2012    Cytogenetic neg on 03/23/2012.  . End stage renal disease on dialysis   . Asthma   . Hyperparathyroidism, secondary renal   . Peripheral arterial disease     Past Surgical History  Procedure Laterality Date  . Thyroidectomy    . Cervical disc surgery    . Eye surgery      CATARACTS  . Insertion of dialysis catheter Right 03/19/2012    Procedure: INSERTION OF DIALYSIS CATHETER;  Surgeon: Mal Misty, MD;  Location: [place];  Service: Vascular;  Laterality: Right;  Right Internal Jugular  . Av fistula placement Left 03/25/2012    Procedure: ARTERIOVENOUS (AV) FISTULA CREATION;  Surgeon: Mal Misty, MD;  Location: [place];  Service: Vascular;  Laterality: Left;  . Ligation of competing branches of arteriovenous fistula Left 05/08/2012    Procedure: LIGATION OF COMPETING BRANCHES OF ARTERIOVENOUS FISTULA;  Surgeon: Mal Misty, MD;  Location: [place];  Service: Vascular;  Laterality: Left;  Ultrasound guided  . Cardiac catheterization      approx 30  years ago  . Amputation Right 11/10/2012    Procedure: AMPUTATION FIRST and SECOND TOES Right Foot;  Surgeon: Elam Dutch, MD;  Location: [facility] OR;  Service: Vascular;  Laterality: Right;  . Transmetatarsal amputation Left 12/16/2012    Procedure: TRANSMETATARSAL AMPUTATION AND VAC PLACEMENT;  Surgeon: Elam Dutch, MD;  Location: [place];  Service: Vascular;  Laterality: Left;  . Amputation Left 04/07/2013    Procedure: AMPUTATION DIGIT- LEFT 1ST TOE;  Surgeon: Mal Misty, MD;  Location: [place];  Service: Vascular;  Laterality: Left;    History   Social History  . Marital Status: Divorced    Spouse Name: N/A    Number of Children: N/A  . Years of Education: N/A   Occupational History  . Not on file.   Social History Main Topics  . Smoking status: Former Smoker    Types: Cigarettes    Quit date: 03/19/1982  . Smokeless tobacco: Never Used  . Alcohol Use: No     Comment: formerly  . Drug Use: No  . Sexual Activity: No   Other Topics Concern  . Not on file   Social History Narrative  . No narrative on file    Family History  Problem Relation Age of Onset  . Diabetes Mother   . Cancer Mother     bone   . Kidney disease Father     Allergies as of 04/16/2013 - Review Complete 04/16/2013  Allergen Reaction Noted  . Lisinopril Cough 10/05/2012  . Ivp dye [iodinated diagnostic agents] Hives 01/28/2011  . Morphine and related Other (See Comments) 03/18/2012    No current facility-administered medications on file prior to encounter.   Current Outpatient Prescriptions on File Prior to Encounter  Medication Sig Dispense Refill  . aspirin EC 81 MG tablet Take 81 mg by mouth daily.      Marland Kitchen b complex-vitamin c-folic acid (NEPHRO-VITE) 0.8 MG TABS tablet Take 1 tablet by mouth daily.      Marland Kitchen doxazosin (CARDURA) 2 MG tablet Take 2 mg by mouth at bedtime.      Marland Kitchen doxercalciferol (HECTOROL) 4 MCG/2ML injection Inject 0.5 mLs (1 mcg total) into the vein every Monday,  Wednesday, and Friday with hemodialysis.  2 mL    . Nutritional Supplements (FEEDING SUPPLEMENT, NEPRO CARB STEADY,) LIQD Take 237 mLs by mouth 2 (two) times daily between meals.    0  . saxagliptin HCl (ONGLYZA) 5 MG TABS tablet Take 5 mg by mouth at bedtime.       . sodium chloride 0.9 % SOLN 100 mL with ferric gluconate 12.5 MG/ML SOLN 125 mg Inject 125 mg into the vein every Monday, Wednesday, and Friday with hemodialysis.            PHYSICAL EXAMINATION:  General: The patient is a well-nourished male, in no acute distress. Vital signs are BP 144/63  Pulse 78  Temp(Src) 102.3 F (39.1 C) (Oral)  Resp 17  Ht _0  (1.778 m)  Wt 184 lb 3.2 oz (83.553 kg)  BMI 26.43 kg/m2  SpO2 95% Pulmonary: There is a good air exchange  Musculoskeletal: There are no major deformities.  Healed right transmetatarsal amputation Neurologic: No focal weakness or paresthesias are detected, Skin: There are no ulcer or rashes noted. Psychiatric: The patient has normal affect. Cardiovascular: Patient has 2+ popliteal pulses bilaterally absent pedal pulses. The skin sutures are intact on the toe at the left great toe amputation site. There is surrounding erythema but no fluctuance.    Impression and Plan:  Diabetic foot infection with recent left great toe amputation. Agree with antibiotics as is being done. I had a long discussion with the patient and 3 family members present. Explained that I do not see any evidence for opening the wound since there is no evidence of fluctuance. I certainly agree with IV antibiotics for initial treatment. He understands may require additional debridement depending on how this resolves. Will follow with you.    EARLY, TODD [facility] Office: [phone]

## 2013-04-17 NOTE — Progress Notes (Signed)
ANTIBIOTIC CONSULT NOTE - INITIAL  Pharmacy Consult for Vancomycin/Zosyn Indication: Wound infection   Allergies  Allergen Reactions  . Lisinopril Cough  . Ivp Dye [Iodinated Diagnostic Agents] Hives  . Morphine And Related Other (See Comments)    Bradycardia states patient    Patient Measurements: Height: '5\' 10"'  (177.8 cm) Weight: 184 lb 3.2 oz (83.553 kg) IBW/kg (Calculated) : 73  Vital Signs: Temp: 102.5 F (39.2 C) (04/04 0100) Temp src: Oral (04/04 0100) BP: 166/69 mmHg (04/04 0100) Pulse Rate: 73 (04/04 0100)  Labs:  Recent Labs  04/16/13 2017  WBC 14.0*  HGB 11.0*  PLT 197  CREATININE 4.00*   Estimated Creatinine Clearance: 18 ml/min (by C-G formula based on Cr of 4).  Medical History: Past Medical History  Diagnosis Date  . Hypertension   . Diabetes mellitus without complication   . Shortness of breath   . Pneumonia     2012  . Heart murmur   . Glaucoma   . Multiple myeloma, without mention of having achieved remission 03/30/2012    Cytogenetic neg on 03/23/2012.  . End stage renal disease on dialysis   . Asthma   . Hyperparathyroidism, secondary renal   . Peripheral arterial disease    Assessment: 68 y/o M s/p first toe amputation on 3/25 to start vanco/zosyn. Pt has ESRD on HD MWF. WBC 14, other labs as above.   Goal of Therapy:  Pre-HD vancomycin level 15-29m/L  Plan:  -Vancomycin 2000 mg IV x 1, then 1000 mg IV qHD MWF -Zosyn 2.25g IV q8h -Trend WBC, temp, renal function  -F/U HD schedule -Drug levels as indicated  LNarda Bonds4/04/2013,2:38 AM

## 2013-04-18 ENCOUNTER — Inpatient Hospital Stay (HOSPITAL_COMMUNITY): Payer: Medicare Other

## 2013-04-18 DIAGNOSIS — E871 Hypo-osmolality and hyponatremia: Secondary | ICD-10-CM

## 2013-04-18 LAB — GLUCOSE, CAPILLARY
GLUCOSE-CAPILLARY: 137 mg/dL — AB (ref 70–99)
GLUCOSE-CAPILLARY: 144 mg/dL — AB (ref 70–99)
GLUCOSE-CAPILLARY: 132 mg/dL — AB (ref 70–99)
GLUCOSE-CAPILLARY: 112 mg/dL — AB (ref 70–99)

## 2013-04-18 NOTE — Progress Notes (Signed)
CRITICAL VALUE ALERT  Critical value received:  Blood culture positive in the aerobic bottle for gram positive cocci in clusters  Date of notification: 04/18/13  Time of notification:  10:52  Critical value read back:yes  Nurse who received alert:  Virgilio Frees   MD notified (1st page):  Dr. Aileen Fass  Time of first page:  10:54  MD notified (2nd page): in hallway  Time of second page:  Responding MD:  Dr. Aileen Fass   Time MD responded:

## 2013-04-18 NOTE — Progress Notes (Signed)
[facility] PROGRESS NOTE Interim History: Jack [name]. He has a very complex medical history with diabetic foot infection. He was found to have gangrene of his left great toe. 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 He underwent amputation of his left great toe by Dr. Kellie Simmering on 04/07/2013   Assessment/Plan: Fever, unspecified/left foot surgical site infection after surgery - Started on Vanc and zosyn on 4.4.2015. - cont to spike fevers, consulted vascular may need debridement. - MRI of foot pending. BC gram positive cocci in cluster.- NPO after midnight.  Diabetes mellitus type 1, controlled, with complications - Controlled. - cont SSI.  Essential hypertension, benign - stable cont home emds.  ESRD: - per renal for HD M,W,F.  Anemia of chronic disease; - per renal.  Code Status: full Family Communication: daughters  Disposition Plan: inpatinet   Consultants:  Renal  Vascular  Procedures:  MRI  Antibiotics:  vanc zosyn (indicate start date, and stop date if known)  HPI/Subjective: no complains  Objective: Filed Vitals:   04/17/13 1759 04/17/13 2058 04/18/13 0600 04/18/13 0925  BP: 159/70 126/63 144/69 131/66  Pulse: 65 85 70 67  Temp: 101.4 F (38.6 C) 102.9 F (39.4 C) 99.6 F (37.6 C) 99.3 F (37.4 C)  TempSrc: Oral Oral Oral Oral  Resp: 16 14 14 16   Height:  5\' 10"  (1.778 m)    Weight:  85.503 kg (188 lb 8 oz)    SpO2: 94% 91% 99% 93%    Intake/Output Summary (Last 24 hours) at 04/18/13 1145 Last data filed at 04/18/13 1013  Gross per 24 hour  Intake    488 ml  Output      1 ml  Net    487 ml   Filed Weights   04/17/13 0100 04/17/13 2058  Weight: 83.553 kg (184 lb 3.2 oz) 85.503 kg (188 lb 8 oz)    Exam:  General: Alert, awake, oriented x3, in no acute distress.  HEENT: No bruits, no goiter.  Heart: Regular rate and rhythm, without murmurs, rubs, gallops.  Lungs: Good air  movement, clear Abdomen: Soft, nontender, nondistended, positive bowel sounds.   Data Reviewed: Basic Metabolic Panel:  Recent Labs Lab 04/16/13 2017 04/17/13 0550  NA 138 137  K 3.1* 3.4*  CL 93* 91*  CO2 30 26  GLUCOSE 230* 150*  BUN 18 21  CREATININE 4.00* 4.92*  4.73*  CALCIUM 8.6 8.9   Liver Function Tests: No results found for this basename: AST, ALT, ALKPHOS, BILITOT, PROT, ALBUMIN,  in the last 168 hours No results found for this basename: LIPASE, AMYLASE,  in the last 168 hours No results found for this basename: AMMONIA,  in the last 168 hours CBC:  Recent Labs Lab 04/16/13 2017 04/17/13 0550  WBC 14.0* 14.8*  14.7*  NEUTROABS 12.3*  --   HGB 11.0* 11.2*  11.1*  HCT 35.4* 36.2*  36.2*  MCV 86.8 86.6  86.8  PLT 197 207  217   Cardiac Enzymes: No results found for this basename: CKTOTAL, CKMB, CKMBINDEX, TROPONINI,  in the last 168 hours BNP (last 3 results) No results found for this basename: PROBNP,  in the last 8760 hours CBG:  Recent Labs Lab 04/17/13 0831 04/17/13 1123 04/17/13 1550 04/17/13 2055 04/18/13 0847  GLUCAP 136* 156* 128* 170* 137*    Recent Results (from the past 240 hour(s))  SURGICAL PCR SCREEN     Status: None   Collection Time    04/17/13  5:10 AM      Result Value Ref Range Status   MRSA, PCR NEGATIVE  NEGATIVE Final   Staphylococcus aureus NEGATIVE  NEGATIVE Final   Comment:            The Xpert SA Assay (FDA     approved for NASAL specimens     in patients over 15 years of age),     is one component of     a comprehensive surveillance     program.  Test performance has     been validated by [employer] for patients greater     than or equal to 32 year old.     It is not intended     to diagnose infection nor to     guide or monitor treatment.  CULTURE, BLOOD (ROUTINE X 2)     Status: None   Collection Time    04/17/13  5:48 AM      Result Value Ref Range Status   Specimen Description BLOOD RIGHT HAND    Final   Special Requests BOTTLES DRAWN AEROBIC ONLY 5CC   Final   Culture  Setup Time     Final   Value: 04/17/2013 12:44     Performed at [facility]   Culture     Final   Value: GRAM POSITIVE COCCI IN CLUSTERS     Note: Gram Stain Report Called to,Read Back By and Verified With: ASHLEY LARSON 04/18/12 @ 10:45AM BY RUSCOE A.     Performed at [facility]   Report Status PENDING   Incomplete  CULTURE, BLOOD (ROUTINE X 2)     Status: None   Collection Time    04/17/13  5:50 AM      Result Value Ref Range Status   Specimen Description BLOOD RIGHT HAND   Final   Special Requests     Final   Value: BOTTLES DRAWN AEROBIC AND ANAEROBIC 10CC BLUE,5CC RED   Culture  Setup Time     Final   Value: 04/17/2013 12:44     Performed at [facility]   Culture     Final   Value: GRAM POSITIVE COCCI IN CLUSTERS     Note: Gram Stain Report Called to,Read Back By and Verified With: Earleen Reaper RN on 04/18/13 at 05:50 by Rise Mu     Performed at [facility]   Report Status PENDING   Incomplete     Studies: Mr Ankle Left  Wo Contrast  04/17/2013   CLINICAL DATA:  Fever, pain, swelling, and redness at the site of amputation of the left great toe. Elevated white blood count. Wound infection.  EXAM: MRI OF THE LEFT ANKLE WITHOUT CONTRAST  TECHNIQUE: Multiplanar, multisequence MR imaging of the ankle was performed. No intravenous contrast was administered.  COMPARISON:  Radiographs dated 03/23/2013  FINDINGS: There is circumferential edema in the subcutaneous tissues of the ankle and on the dorsum of the foot, nonspecific.  TENDONS  Peroneal: Normal.  Posteromedial: Normal.  Anterior: Normal.  Achilles: Normal.  Plantar Fascia: No acute abnormality. Chronic degenerative changes in the origin of the medial band of the plantar fascia.  LIGAMENTS  Lateral: Normal.  Medial: Normal.  .  CARTILAGE  Ankle Joint: Normal.  Subtalar Joints/Sinus Tarsi: Normal.  Bones: Minimal dorsal  spurring on the distal talus. Minimal posterior spurring on the distal tibia.  IMPRESSION: No evidence of osteomyelitis or abscess. Edema in the subcutaneous  tissues around the ankle and dorsum of the hindfoot, nonspecific.   Electronically Signed   By: Rozetta Nunnery M.D.   On: 04/17/2013 15:23    Scheduled Meds: . colchicine  0.6 mg Oral Daily  . doxazosin  2 mg Oral QHS  . [START ON 04/19/2013] doxercalciferol  1 mcg Intravenous Q M,W,F-HD  . feeding supplement (NEPRO CARB STEADY)  237 mL Oral BID BM  . heparin  5,000 Units Subcutaneous 3 times per day  . insulin aspart  0-5 Units Subcutaneous QHS  . insulin aspart  0-9 Units Subcutaneous TID WC  . levothyroxine  175 mcg Oral QAC breakfast  . multivitamin  1 tablet Oral QHS  . nebivolol  5 mg Oral QHS  . piperacillin-tazobactam (ZOSYN)  IV  2.25 g Intravenous 3 times per day  . sodium chloride  3 mL Intravenous Q12H  . [START ON 04/19/2013] vancomycin  1,000 mg Intravenous Q M,W,F-HD   Continuous Infusions:    Charlynne Cousins  [facility] Pager [phone]. If 8PM-8AM, please contact night-coverage at [email or web address], password [facility] 04/18/2013, 11:45 AM  LOS: 2 days

## 2013-04-18 NOTE — Progress Notes (Signed)
I have personally seen and examined this patient and agree with the assessment/plan as outlined above by [facility]. NPO starting midnight tonight for possible wound debridement/exploration tomorrow. With gram-positive cocci bacteremia and broad-spectrum antibiotic coverage accordingly. Low-grade fevers overnight. Rayyan Orsborn K.,MD 04/18/2013 10:48 AM

## 2013-04-18 NOTE — Progress Notes (Signed)
CRITICAL VALUE ALERT  Critical value received:  Pos Blood Cx gram + cocci/clusters on anerobic bottle  Date of notification:  04/18/2013  Time of notification:  0600  Critical value read back:yes  Nurse who received alert:  Earleen Reaper RN  MD notified (1st page):  Harless Litten NP  Time of first page:  [phone]  MD notified (2nd page):  Time of second page:  Responding MD: Harless Litten NP  Time MD responded:  [phone]

## 2013-04-18 NOTE — Progress Notes (Signed)
Subjective: Interval History: none.. denies pain in. left foot  Objective: Vital signs in last 24 hours: Temp:  [99.4 F (37.4 C)-102.9 F (39.4 C)] 99.6 F (37.6 C) (04/05 0600) Pulse Rate:  [64-85] 70 (04/05 0600) Resp:  [14-20] 14 (04/05 0600) BP: (124-159)/(59-70) 144/69 mmHg (04/05 0600) SpO2:  [91 %-99 %] 99 % (04/05 0600) Weight:  [188 lb 8 oz (85.503 kg)] 188 lb 8 oz (85.503 kg) (04/04 2058)  Intake/Output from previous day: 04/04 0701 - 04/05 0700 In: 128 [I.V.:28; IV Piggyback:100] Out: 1 [Stool:1] Intake/Output this shift:    Currently afebril Left foot with easy palpable popliteal pulse. No tenderness and no fluctuance in his foot at all. Does have a slight amount of duskiness at the skin edge at the base of the left second toe. No fluctuance and no tenderness over the metatarsal head. On examination of his left upper arm AV fistula there is no erythema and no evidences could be a source for   Lab Results:  Recent Labs  04/16/13 2017 04/17/13 0550  WBC 14.0* 14.8*  14.7*  HGB 11.0* 11.2*  11.1*  HCT 35.4* 36.2*  36.2*  PLT 197 207  217   BMET  Recent Labs  04/16/13 2017 04/17/13 0550  NA 138 137  K 3.1* 3.4*  CL 93* 91*  CO2 30 26  GLUCOSE 230* 150*  BUN 18 21  CREATININE 4.00* 4.92*  4.73*  CALCIUM 8.6 8.9    Studies/Results: Mr Ankle Left  Wo Contrast  04/17/2013   CLINICAL DATA:  Fever, pain, swelling, and redness at the site of amputation of the left great toe. Elevated white blood count. Wound infection.  EXAM: MRI OF THE LEFT ANKLE WITHOUT CONTRAST  TECHNIQUE: Multiplanar, multisequence MR imaging of the ankle was performed. No intravenous contrast was administered.  COMPARISON:  Radiographs dated 03/23/2013  FINDINGS: There is circumferential edema in the subcutaneous tissues of the ankle and on the dorsum of the foot, nonspecific.  TENDONS  Peroneal: Normal.  Posteromedial: Normal.  Anterior: Normal.  Achilles: Normal.  Plantar Fascia: No  acute abnormality. Chronic degenerative changes in the origin of the medial band of the plantar fascia.  LIGAMENTS  Lateral: Normal.  Medial: Normal.  .  CARTILAGE  Ankle Joint: Normal.  Subtalar Joints/Sinus Tarsi: Normal.  Bones: Minimal dorsal spurring on the distal talus. Minimal posterior spurring on the distal tibia.  IMPRESSION: No evidence of osteomyelitis or abscess. Edema in the subcutaneous tissues around the ankle and dorsum of the hindfoot, nonspecific.   Electronically Signed   By: Rozetta Nunnery M.D.   On: 04/17/2013 15:23   Dg Bone Survey Met  03/23/2013   CLINICAL DATA:  History of multiple myeloma, no current complaints  EXAM: METASTATIC BONE SURVEY  COMPARISON:  DG BONE SURVEY MET dated 03/20/2012; CT HEAD W/O CM dated 02/23/2013; DG BONE SURVEY MET dated 01/28/2011  FINDINGS: There is a grossly unchanged approximately 8 mm lytic lesion within the mid aspect of the calvarium, best seen on the provided lateral radiograph, likely unchanged since the prior examination.  Apparent development of a ill-defined lytic lesion within the proximal diaphysis of the right humerus measure approximately 8 mm in diameter.  Vertebral body heights appear grossly preserved. Post C3-C4 ACDF without definite evidence of hardware failure or loosening. Moderate DDD of C2-C3 and L4-L5 with disc space height loss, endplate irregularities sclerosis. There is partial ossification of the nuchal ligament.  Mild to moderate degenerative change of the bilateral AC and  glenohumeral joints.  Vascular calcifications.  Grossly unchanged enlarged cardiac silhouette and mediastinal contours with atherosclerotic plaque within the thoracic aorta. There is mild elevation the right hemidiaphragm. Interval removal of right jugular approach dialysis catheter. Surgical clips are seen within the left antecubital fossa, possibly at the location of a dialysis graft or fistula.  IMPRESSION: 1. Possible new approximately 8 mm lytic lesion within  the proximal diaphysis of the right humerus. 2. Grossly unchanged approximately 8 mm possible lytic lesion within the calvarium.   Electronically Signed   By: Sandi Mariscal M.D.   On: 03/23/2013 12:01   Dg Toe Great Left  03/23/2013   CLINICAL DATA:  Nonhealing wound  EXAM: LEFT GREAT TOE  COMPARISON:  None.  FINDINGS: There is a soft tissue defect about the distal tip of the great digit. While this soft tissue defect appears to nearly abut the tuft of the distal phalanx, this finding is without definitive associated osteolysis to suggest osteomyelitis. No radiopaque foreign body. No subcutaneous emphysema.  No fracture or dislocation. Vascular calcifications. Joint spaces are preserved.  IMPRESSION: Soft tissue defect nearly abuts the tuft of the great toe but is without definitive evidence of osteomyelitis. Further evaluation with MRI could be performed as clinically indicated.   Electronically Signed   By: Sandi Mariscal M.D.   On: 03/23/2013 12:05   Anti-infectives: Anti-infectives   Start     Dose/Rate Route Frequency Ordered Stop   04/19/13 1200  vancomycin (VANCOCIN) IVPB 1000 mg/200 mL premix     1,000 mg 200 mL/hr over 60 Minutes Intravenous Every M-W-F (Hemodialysis) 04/17/13 0245     04/17/13 0200  vancomycin (VANCOCIN) 2,000 mg in sodium chloride 0.9 % 500 mL IVPB     2,000 mg 250 mL/hr over 120 Minutes Intravenous  Once 04/17/13 0154 04/17/13 0627   04/17/13 0200  piperacillin-tazobactam (ZOSYN) IVPB 2.25 g     2.25 g 100 mL/hr over 30 Minutes Intravenous 3 times per day 04/17/13 0154        Assessment/Plan: s/p * No surgery found * Reviewed MRI from yesterday. This was from the ankle not down into the foot. This showed no evidence of any abscess and the tendon sheaths. Very concerning to continue to have fevers. Did have a blood culture positive for gram-positive cocci. Continue with antibiotics for now. Will keep n.p.o. after midnight tonight. If he continues to have fever will need  to have the expiration of this although it certainly does look benign.   LOS: 2 days   Adelaide Pfefferkorn 04/18/2013, 8:58 AM

## 2013-04-18 NOTE — Progress Notes (Signed)
[facility] Progress Note  Assessment/Plan: 1. Gram + cocci bacteremia - MRI ankle neg for osteo/abscess; s/p hallux amputation  3/25; on Vanc and Zosyn 2. ESRD - MWF - HD in am 3. Anemia - Hgb suprprising stable 11.1 - not on ESA - had been in the 12 - 13 range prior to amputation; last tsat 24% 3/27 - d/c IV Fe - given acute infection - was not on any IV Fe at outpt HD 4. Secondary hyperparathyroidism - continue hectorol and binders 5. HTN/volume - systolic BP 694 - 854; continue volume control; current meds 6. Nutrition - diet had been advanced to carb mod - will add renal to carb mod - add nepro supp  [name], PA-C [place] [phone] 04/18/2013,8:29 AM  LOS: 2 days   Subjective:   Denies pain in foot; no c/o; has been ambulating with walker at home  Objective Filed Vitals:   04/17/13 1346 04/17/13 1759 04/17/13 2058 04/18/13 0600  BP: 124/65 159/70 126/63 144/69  Pulse: 64 65 85 70  Temp: 100.7 F (38.2 C) 101.4 F (38.6 C) 102.9 F (39.4 C) 99.6 F (37.6 C)  TempSrc: Oral Oral Oral Oral  Resp: 16 16 14 14   Height:   5\' 10"  (1.778 m)   Weight:   85.503 kg (188 lb 8 oz)   SpO2: 96% 94% 91% 99%   Physical Exam General: sitting up in bed watching TV eating CL breakfast Heart: RRR 2/6 murmur Lungs: coarse BS Abdomen: soft NT Extremities: right transmet well healed - no right LE edema; left LE/foot + edema/erythema in forefot/sutures in tact; tissue somewhat darker surrounding sutures Dialysis Access: left upper AVF + bruit  Dialysis Orders: Monday/Wednesday/Friday at [facility]. 4 hours, BFR 400/DFR 800, EDW 85.5 kg, Hectorol 1 mcg 3 days a week, heparin 8700 units bolus. Left transposed brachiobasilic fistula. No Epogen or IV Fe  Additional Objective Labs: Basic Metabolic Panel:  Recent Labs Lab 04/16/13 2017 04/17/13 0550  NA 138 137  K 3.1* 3.4*  CL 93* 91*  CO2 30 26  GLUCOSE 230* 150*   BUN 18 21  CREATININE 4.00* 4.92*  4.73*  CALCIUM 8.6 8.9  CBC:  Recent Labs Lab 04/16/13 2017 04/17/13 0550  WBC 14.0* 14.8*  14.7*  NEUTROABS 12.3*  --   HGB 11.0* 11.2*  11.1*  HCT 35.4* 36.2*  36.2*  MCV 86.8 86.6  86.8  PLT 197 207  217   Blood Culture    Component Value Date/Time   SDES BLOOD RIGHT HAND 04/17/2013 0550   SPECREQUEST BOTTLES DRAWN AEROBIC AND ANAEROBIC 10CC BLUE,5CC RED 04/17/2013 0550   CULT  Value: GRAM POSITIVE COCCI IN CLUSTERS Note: Gram Stain Report Called to,Read Back By and Verified With: Earleen Reaper RN on 04/18/13 at 05:50 by Rise Mu Performed at [place] 04/17/2013 0550   REPTSTATUS PENDING 04/17/2013 0550   CBG:  Recent Labs Lab 04/17/13 0139 04/17/13 0831 04/17/13 1123 04/17/13 1550 04/17/13 2055  GLUCAP 136* 136* 156* 128* 170*  Studies/Results: Mr Ankle Left  Wo Contrast  04/17/2013   CLINICAL DATA:  Fever, pain, swelling, and redness at the site of amputation of the left great toe. Elevated white blood count. Wound infection.  EXAM: MRI OF THE LEFT ANKLE WITHOUT CONTRAST  TECHNIQUE: Multiplanar, multisequence MR imaging of the ankle was performed. No intravenous contrast was administered.  COMPARISON:  Radiographs dated 03/23/2013  FINDINGS: There is circumferential edema in the subcutaneous  tissues of the ankle and on the dorsum of the foot, nonspecific.  TENDONS  Peroneal: Normal.  Posteromedial: Normal.  Anterior: Normal.  Achilles: Normal.  Plantar Fascia: No acute abnormality. Chronic degenerative changes in the origin of the medial band of the plantar fascia.  LIGAMENTS  Lateral: Normal.  Medial: Normal.  .  CARTILAGE  Ankle Joint: Normal.  Subtalar Joints/Sinus Tarsi: Normal.  Bones: Minimal dorsal spurring on the distal talus. Minimal posterior spurring on the distal tibia.  IMPRESSION: No evidence of osteomyelitis or abscess. Edema in the subcutaneous tissues around the ankle and dorsum of the hindfoot, nonspecific.    Electronically Signed   By: Rozetta Nunnery M.D.   On: 04/17/2013 15:23   Medications:   . colchicine  0.6 mg Oral Daily  . doxazosin  2 mg Oral QHS  . [START ON 04/19/2013] doxercalciferol  1 mcg Intravenous Q M,W,F-HD  . feeding supplement (NEPRO CARB STEADY)  237 mL Oral BID BM  . [START ON 04/19/2013] ferric gluconate (FERRLECIT/NULECIT) IV  125 mg Intravenous Q M,W,F-HD  . heparin  5,000 Units Subcutaneous 3 times per day  . insulin aspart  0-5 Units Subcutaneous QHS  . insulin aspart  0-9 Units Subcutaneous TID WC  . levothyroxine  175 mcg Oral QAC breakfast  . multivitamin  1 tablet Oral QHS  . nebivolol  5 mg Oral QHS  . piperacillin-tazobactam (ZOSYN)  IV  2.25 g Intravenous 3 times per day  . sodium chloride  3 mL Intravenous Q12H  . [START ON 04/19/2013] vancomycin  1,000 mg Intravenous Q M,W,F-HD

## 2013-04-19 DIAGNOSIS — J9600 Acute respiratory failure, unspecified whether with hypoxia or hypercapnia: Secondary | ICD-10-CM

## 2013-04-19 DIAGNOSIS — T8740 Infection of amputation stump, unspecified extremity: Secondary | ICD-10-CM

## 2013-04-19 DIAGNOSIS — I10 Essential (primary) hypertension: Secondary | ICD-10-CM

## 2013-04-19 DIAGNOSIS — E108 Type 1 diabetes mellitus with unspecified complications: Secondary | ICD-10-CM

## 2013-04-19 DIAGNOSIS — S98919A Complete traumatic amputation of unspecified foot, level unspecified, initial encounter: Secondary | ICD-10-CM

## 2013-04-19 DIAGNOSIS — R509 Fever, unspecified: Secondary | ICD-10-CM

## 2013-04-19 DIAGNOSIS — Y835 Amputation of limb(s) as the cause of abnormal reaction of the patient, or of later complication, without mention of misadventure at the time of the procedure: Secondary | ICD-10-CM

## 2013-04-19 LAB — CBC WITH DIFFERENTIAL/PLATELET
BASOS ABS: 0 10*3/uL (ref 0.0–0.1)
Basophils Relative: 0 % (ref 0–1)
Eosinophils Absolute: 0.1 10*3/uL (ref 0.0–0.7)
Eosinophils Relative: 1 % (ref 0–5)
HEMATOCRIT: 31.4 % — AB (ref 39.0–52.0)
Hemoglobin: 9.8 g/dL — ABNORMAL LOW (ref 13.0–17.0)
LYMPHS PCT: 8 % — AB (ref 12–46)
Lymphs Abs: 0.9 10*3/uL (ref 0.7–4.0)
MCH: 26.6 pg (ref 26.0–34.0)
MCHC: 31.2 g/dL (ref 30.0–36.0)
MCV: 85.3 fL (ref 78.0–100.0)
MONOS PCT: 17 % — AB (ref 3–12)
Monocytes Absolute: 1.9 10*3/uL — ABNORMAL HIGH (ref 0.1–1.0)
Neutro Abs: 8 10*3/uL — ABNORMAL HIGH (ref 1.7–7.7)
Neutrophils Relative %: 74 % (ref 43–77)
PLATELETS: 210 10*3/uL (ref 150–400)
RBC: 3.68 MIL/uL — ABNORMAL LOW (ref 4.22–5.81)
RDW: 15.1 % (ref 11.5–15.5)
WBC: 10.9 10*3/uL — ABNORMAL HIGH (ref 4.0–10.5)

## 2013-04-19 LAB — RENAL FUNCTION PANEL
Albumin: 2.2 g/dL — ABNORMAL LOW (ref 3.5–5.2)
BUN: 54 mg/dL — ABNORMAL HIGH (ref 6–23)
CO2: 24 mEq/L (ref 19–32)
Calcium: 9.1 mg/dL (ref 8.4–10.5)
Chloride: 92 mEq/L — ABNORMAL LOW (ref 96–112)
Creatinine, Ser: 9.47 mg/dL — ABNORMAL HIGH (ref 0.50–1.35)
GFR calc Af Amer: 6 mL/min — ABNORMAL LOW (ref >90)
GFR calc non Af Amer: 5 mL/min — ABNORMAL LOW (ref >90)
Glucose, Bld: 135 mg/dL — ABNORMAL HIGH (ref 70–99)
Phosphorus: 5.7 mg/dL — ABNORMAL HIGH (ref 2.3–4.6)
Potassium: 4 mEq/L (ref 3.7–5.3)
Sodium: 141 mEq/L (ref 137–147)

## 2013-04-19 LAB — GLUCOSE, CAPILLARY
GLUCOSE-CAPILLARY: 117 mg/dL — AB (ref 70–99)
Glucose-Capillary: 112 mg/dL — ABNORMAL HIGH (ref 70–99)
Glucose-Capillary: 186 mg/dL — ABNORMAL HIGH (ref 70–99)

## 2013-04-19 LAB — CBC
HCT: 33 % — ABNORMAL LOW (ref 39.0–52.0)
Hemoglobin: 10.4 g/dL — ABNORMAL LOW (ref 13.0–17.0)
MCH: 26.9 pg (ref 26.0–34.0)
MCHC: 31.5 g/dL (ref 30.0–36.0)
MCV: 85.3 fL (ref 78.0–100.0)
Platelets: 189 10*3/uL (ref 150–400)
RBC: 3.87 MIL/uL — ABNORMAL LOW (ref 4.22–5.81)
RDW: 15.2 % (ref 11.5–15.5)
WBC: 10.2 10*3/uL (ref 4.0–10.5)

## 2013-04-19 LAB — HEPATITIS B SURFACE ANTIGEN: Hepatitis B Surface Ag: NEGATIVE

## 2013-04-19 MED ORDER — ALTEPLASE 2 MG IJ SOLR
2.0000 mg | Freq: Once | INTRAMUSCULAR | Status: AC | PRN
  Filled 2013-04-19: qty 2

## 2013-04-19 MED ORDER — NAFCILLIN SODIUM 2 G IJ SOLR
2.0000 g | INTRAVENOUS | Status: DC
  Administered 2013-04-19 – 2013-04-20 (×4): 2 g via INTRAVENOUS
  Filled 2013-04-19 (×12): qty 2000

## 2013-04-19 MED ORDER — NEPRO/CARBSTEADY PO LIQD: 237.0000 mL | ORAL | Status: DC | PRN

## 2013-04-19 MED ORDER — SODIUM CHLORIDE 0.9 % IV SOLN: 100.0000 mL | INTRAVENOUS | Status: DC | PRN

## 2013-04-19 MED ORDER — LIDOCAINE-PRILOCAINE 2.5-2.5 % EX CREA: 1.0000 "application " | TOPICAL_CREAM | CUTANEOUS | Status: DC | PRN

## 2013-04-19 MED ORDER — LIDOCAINE HCL (PF) 1 % IJ SOLN: 5.0000 mL | INTRAMUSCULAR | Status: DC | PRN

## 2013-04-19 MED ORDER — PENTAFLUOROPROP-TETRAFLUOROETH EX AERO: 1.0000 "application " | INHALATION_SPRAY | CUTANEOUS | Status: DC | PRN

## 2013-04-19 MED ORDER — HEPARIN SODIUM (PORCINE) 1000 UNIT/ML DIALYSIS
20.0000 [IU]/kg | INTRAMUSCULAR | Status: DC | PRN
  Filled 2013-04-19: qty 2

## 2013-04-19 MED ORDER — DOXERCALCIFEROL 4 MCG/2ML IV SOLN
INTRAVENOUS | Status: AC
  Administered 2013-04-19: 1 ug via INTRAVENOUS
  Filled 2013-04-19: qty 2

## 2013-04-19 MED ORDER — HEPARIN SODIUM (PORCINE) 1000 UNIT/ML DIALYSIS
1000.0000 [IU] | INTRAMUSCULAR | Status: DC | PRN
  Filled 2013-04-19: qty 1

## 2013-04-19 NOTE — Progress Notes (Signed)
Patient ID: Jack [name], male   DOB: [birth year], 68 y.o.   MRN: [id number]

## 2013-04-19 NOTE — Progress Notes (Signed)
Subjective:  Slightly confused, denies any pain, no complaints  Objective: Vital signs in last 24 hours: Temp:  [99.9 F (37.7 C)-101.2 F (38.4 C)] 99.9 F (37.7 C) (04/06 0615) Pulse Rate:  [68-78] 69 (04/06 0615) Resp:  [16-18] 18 (04/06 0615) BP: (139-145)/(59-65) 145/65 mmHg (04/06 0615) SpO2:  [92 %-98 %] 95 % (04/06 0615) Weight:  [84.2 kg (185 lb 10 oz)] 84.2 kg (185 lb 10 oz) (04/05 2145) Weight change: -1.303 kg (-2 lb 14 oz)  Intake/Output from previous day: 04/05 0701 - 04/06 0700 In: 705 [P.O.:480; I.V.:25; IV Piggyback:200] Out: 0    Lab Results:  Recent Labs  04/16/13 2017 04/17/13 0550  WBC 14.0* 14.8*  14.7*  HGB 11.0* 11.2*  11.1*  HCT 35.4* 36.2*  36.2*  PLT 197 207  217   BMET:  Recent Labs  04/16/13 2017 04/17/13 0550  NA 138 137  K 3.1* 3.4*  CL 93* 91*  CO2 30 26  GLUCOSE 230* 150*  BUN 18 21  CREATININE 4.00* 4.92*  4.73*  CALCIUM 8.6 8.9   No results found for this basename: PTH,  in the last 72 hours Iron Studies: No results found for this basename: IRON, TIBC, TRANSFERRIN, FERRITIN,  in the last 72 hours  Studies/Results: Mr Foot Left Wo Contrast  04/18/2013   CLINICAL DATA:  Recent great toe amputation. Surgical wound infection.  EXAM: MRI OF THE LEFT FOREFOOT WITHOUT CONTRAST  TECHNIQUE: Multiplanar, multisequence MR imaging was performed. No intravenous contrast was administered.  COMPARISON:  Radiograph dated 03/23/2013  FINDINGS: The great toe has been amputated. There is no bone destruction or edema in the first metatarsal. There is slight edema in the soft tissues adjacent to the head of the first metatarsal deep to the operative wound.  The other bones of the forefoot are essentially normal.  The adjacent muscles and tendons appear normal. No joint effusions. There is slight sclerosis and irregularity of the sesamoids at the head of the first metatarsal. This is not significant.  IMPRESSION: No evidence of abscess or  osteomyelitis. Small amount of fluid adjacent to the head of the first metatarsal is consistent with normal postsurgical seroma.   Electronically Signed   By: Rozetta Nunnery M.D.   On: 04/18/2013 13:24    EXAM: General appearance: Alert, in no apparent distress Resp:  CTA without rales, rhonchi, or wheezes Cardio:  RRR with Gr II/VI systolic murmur, no rub GI:  + BS, soft and nontender Extremities:  No edema, R TMA well healed, L 1st toe TMA intact with no drainage Purplish discoloration prox to toe amp across most of forefoot and involving L 2nd toe Access:  AVF @ LUA with + bruit  Dialysis Orders: Monday/Wednesday/Friday at [facility]. 4 hours, BFR 400/DFR 800, EDW 85.5 kg, Hectorol 1 mcg 3 days a week, heparin 8700 units bolus. Left transposed brachiobasilic fistula. No Epogen or IV Fe  Assessment/Plan: 1. L great toe gangrene/Gram + bacteremia - s/p L 1st toe transmetatarsal amputation 3/25 by Dr. Kellie Simmering, on Vancomycin & Zosyn. Staph aureus in blood cx, on Vanc/Zosyn IV.   2. ESRD - HD on MWF @ [place].  HD pending today. 3. HTN/Volume - BP 145/65 on Doxazosin 2 mg qhs, Bystolic 5 mg qhs; wt 75.1 kg, below EDW. 4. Anemia - Hgb 11.2, stable. 5. Sec HPT - Ca 8.9, Hectorol 1 mcg, no binders.  6. Nutrition - Carb-mod renal diet, multivitamin, supplement. 7. DM - insulin per primary.  LOS: 3 days   LYLES,CHARLES 04/19/2013,9:53 AM  Pt seen, examined, agree w assess/plan as above with additions as indicated.  [name] MD pager [phone]    cell [phone] 04/19/2013, 12:54 PM

## 2013-04-19 NOTE — Progress Notes (Signed)
Utilization review completed.  

## 2013-04-19 NOTE — Progress Notes (Signed)
[facility] PROGRESS NOTE Interim History: [name]. He has a very complex medical history with diabetic foot infection. He was found to have gangrene of his left great toe. 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 He underwent amputation of his left great toe by Dr. Kellie Simmering on 04/07/2013   Assessment/Plan: Fever, due to bacteremia: - Started on Vanc and zosyn on 4.4.2015. - has defervesce, MRI No evidence of abscess or osteomyelitis, amount of fluid adjacent to the head of the first metatarsal  - BC 2/2 gram positive cocci in cluster. - Echo, repeat blood cultures.  Diabetes mellitus type 1, controlled, with complications - Controlled. - cont SSI.  Essential hypertension, benign - stable cont home emds.  ESRD: - per renal for HD M,W,F.  Anemia of chronic disease; - per renal.  Code Status: full Family Communication: daughters  Disposition Plan: inpatinet   Consultants:  Renal  Vascular  Procedures:  MRI  Antibiotics:  vanc zosyn   HPI/Subjective: no complains  Objective: Filed Vitals:   04/18/13 1314 04/18/13 1731 04/18/13 2145 04/19/13 0615  BP: 139/64 139/60 145/59 145/65  Pulse: 70 68 78 69  Temp: 100 F (37.8 C) 100.7 F (38.2 C) 101.2 F (38.4 C) 99.9 F (37.7 C)  TempSrc: Oral Oral Oral Oral  Resp: 16 16 18 18   Height:      Weight:   84.2 kg (185 lb 10 oz)   SpO2: 98% 92% 92% 95%    Intake/Output Summary (Last 24 hours) at 04/19/13 1043 Last data filed at 04/19/13 0700  Gross per 24 hour  Intake    345 ml  Output      0 ml  Net    345 ml   Filed Weights   04/17/13 0100 04/17/13 2058 04/18/13 2145  Weight: 83.553 kg (184 lb 3.2 oz) 85.503 kg (188 lb 8 oz) 84.2 kg (185 lb 10 oz)    Exam:  General: Alert, awake, oriented x3, in no acute distress.  HEENT: No bruits, no goiter.  Heart: Regular rate and rhythm, without murmurs, rubs, gallops.  Lungs: Good air movement,  clear Abdomen: Soft, nontender, nondistended, positive bowel sounds.   Data Reviewed: Basic Metabolic Panel:  Recent Labs Lab 04/16/13 2017 04/17/13 0550  NA 138 137  K 3.1* 3.4*  CL 93* 91*  CO2 30 26  GLUCOSE 230* 150*  BUN 18 21  CREATININE 4.00* 4.92*  4.73*  CALCIUM 8.6 8.9   Liver Function Tests: No results found for this basename: AST, ALT, ALKPHOS, BILITOT, PROT, ALBUMIN,  in the last 168 hours No results found for this basename: LIPASE, AMYLASE,  in the last 168 hours No results found for this basename: AMMONIA,  in the last 168 hours CBC:  Recent Labs Lab 04/16/13 2017 04/17/13 0550  WBC 14.0* 14.8*  14.7*  NEUTROABS 12.3*  --   HGB 11.0* 11.2*  11.1*  HCT 35.4* 36.2*  36.2*  MCV 86.8 86.6  86.8  PLT 197 207  217   Cardiac Enzymes: No results found for this basename: CKTOTAL, CKMB, CKMBINDEX, TROPONINI,  in the last 168 hours BNP (last 3 results) No results found for this basename: PROBNP,  in the last 8760 hours CBG:  Recent Labs Lab 04/18/13 0847 04/18/13 1329 04/18/13 1531 04/18/13 2143 04/19/13 0734  GLUCAP 137* 144* 132* 112* 117*    Recent Results (from the past 240 hour(s))  SURGICAL PCR SCREEN     Status: None   Collection Time  04/17/13  5:10 AM      Result Value Ref Range Status   MRSA, PCR NEGATIVE  NEGATIVE Final   Staphylococcus aureus NEGATIVE  NEGATIVE Final   Comment:            The Xpert SA Assay (FDA     approved for NASAL specimens     in patients over 51 years of age),     is one component of     a comprehensive surveillance     program.  Test performance has     been validated by [employer] for patients greater     than or equal to 27 year old.     It is not intended     to diagnose infection nor to     guide or monitor treatment.  CULTURE, BLOOD (ROUTINE X 2)     Status: None   Collection Time    04/17/13  5:48 AM      Result Value Ref Range Status   Specimen Description BLOOD RIGHT HAND   Final    Special Requests BOTTLES DRAWN AEROBIC ONLY 5CC   Final   Culture  Setup Time     Final   Value: 04/17/2013 12:44     Performed at [facility]   Culture     Final   Value: GRAM POSITIVE COCCI IN CLUSTERS     Note: Gram Stain Report Called to,Read Back By and Verified With: ASHLEY LARSON 04/18/12 @ 10:45AM BY RUSCOE A.     Performed at [facility]   Report Status PENDING   Incomplete  CULTURE, BLOOD (ROUTINE X 2)     Status: None   Collection Time    04/17/13  5:50 AM      Result Value Ref Range Status   Specimen Description BLOOD RIGHT HAND   Final   Special Requests     Final   Value: BOTTLES DRAWN AEROBIC AND ANAEROBIC 10CC BLUE,5CC RED   Culture  Setup Time     Final   Value: 04/17/2013 12:44     Performed at [facility]   Culture     Final   Value: GRAM POSITIVE COCCI IN CLUSTERS     Note: Gram Stain Report Called to,Read Back By and Verified With: Earleen Reaper RN on 04/18/13 at 05:50 by Rise Mu     Performed at [facility]   Report Status PENDING   Incomplete     Studies: Mr Foot Left Wo Contrast  04/18/2013   CLINICAL DATA:  Recent great toe amputation. Surgical wound infection.  EXAM: MRI OF THE LEFT FOREFOOT WITHOUT CONTRAST  TECHNIQUE: Multiplanar, multisequence MR imaging was performed. No intravenous contrast was administered.  COMPARISON:  Radiograph dated 03/23/2013  FINDINGS: The great toe has been amputated. There is no bone destruction or edema in the first metatarsal. There is slight edema in the soft tissues adjacent to the head of the first metatarsal deep to the operative wound.  The other bones of the forefoot are essentially normal.  The adjacent muscles and tendons appear normal. No joint effusions. There is slight sclerosis and irregularity of the sesamoids at the head of the first metatarsal. This is not significant.  IMPRESSION: No evidence of abscess or osteomyelitis. Small amount of fluid adjacent to the head of the  first metatarsal is consistent with normal postsurgical seroma.   Electronically Signed   By: Vanita Ingles.D.  On: 04/18/2013 13:24   Mr Ankle Left  Wo Contrast  04/17/2013   CLINICAL DATA:  Fever, pain, swelling, and redness at the site of amputation of the left great toe. Elevated white blood count. Wound infection.  EXAM: MRI OF THE LEFT ANKLE WITHOUT CONTRAST  TECHNIQUE: Multiplanar, multisequence MR imaging of the ankle was performed. No intravenous contrast was administered.  COMPARISON:  Radiographs dated 03/23/2013  FINDINGS: There is circumferential edema in the subcutaneous tissues of the ankle and on the dorsum of the foot, nonspecific.  TENDONS  Peroneal: Normal.  Posteromedial: Normal.  Anterior: Normal.  Achilles: Normal.  Plantar Fascia: No acute abnormality. Chronic degenerative changes in the origin of the medial band of the plantar fascia.  LIGAMENTS  Lateral: Normal.  Medial: Normal.  .  CARTILAGE  Ankle Joint: Normal.  Subtalar Joints/Sinus Tarsi: Normal.  Bones: Minimal dorsal spurring on the distal talus. Minimal posterior spurring on the distal tibia.  IMPRESSION: No evidence of osteomyelitis or abscess. Edema in the subcutaneous tissues around the ankle and dorsum of the hindfoot, nonspecific.   Electronically Signed   By: Rozetta Nunnery M.D.   On: 04/17/2013 15:23    Scheduled Meds: . colchicine  0.6 mg Oral Daily  . doxazosin  2 mg Oral QHS  . doxercalciferol  1 mcg Intravenous Q M,W,F-HD  . feeding supplement (NEPRO CARB STEADY)  237 mL Oral BID BM  . heparin  5,000 Units Subcutaneous 3 times per day  . insulin aspart  0-5 Units Subcutaneous QHS  . insulin aspart  0-9 Units Subcutaneous TID WC  . levothyroxine  175 mcg Oral QAC breakfast  . multivitamin  1 tablet Oral QHS  . nebivolol  5 mg Oral QHS  . piperacillin-tazobactam (ZOSYN)  IV  2.25 g Intravenous 3 times per day  . sodium chloride  3 mL Intravenous Q12H  . vancomycin  1,000 mg Intravenous Q M,W,F-HD    Continuous Infusions:    Charlynne Cousins  [facility] Pager [phone]. If 8PM-8AM, please contact night-coverage at [email or web address], password [facility] 04/19/2013, 10:43 AM  LOS: 3 days

## 2013-04-19 NOTE — Consult Note (Signed)
[place] for Infectious Disease    Date of Admission:  04/16/2013  Date of Consult:  04/19/2013  Reason for Consult: Staphylococcus aureus bacteremia and gangrenous foot Referring Physician: Dr Olevia Bowens   HPI: KORTLAND NICHOLS is an 68 y.o. male with past medical history significant for end-stage renal disease on hemodialysis via left sided fistula, diabetes peripheral vascular disease multiple myeloma who had developed gangrenous changes in his left foot. He is status post amputation of first toe on March 25. He was readmitted on the fourth for fevers pain redness at the surgical site and was started on IV vancomycin and Zosyn. He has not yet had surgery but then followed closely by vascular surgery who felt that his wound looked up virtually unchanged.  Unfortunately is now sick him to Staphylococcus aureus bacteremia with blood cultures positive from admission.  I'm going to narrow his antibiotics to vancomycin and nafcillin the targeted staph aureus.  Given the emergence of staph aureus bacteremia I think that we need to reconsider need for surgery at the operative site as the risk of under treating this surgically could compromise treatment of his bacteremia plus or minus endocarditis.  Forcefully he has no central line or hemodialysis catheter in his AV fistula site appears healthy and without evidence of infection.  He does report as is his wife some episodes of confusion making the concern that he could have endocarditis with possible septic emboli the brain only has no focal findings on exam neurologically.  We are picking antibiotics to optimize CNS penetration cover both MRSA and MSSA.  He'll need a transesophageal echocardiogram tomorrow to rule out endocarditis   Past Medical History  Diagnosis Date  . Hypertension   . Diabetes mellitus without complication   . Shortness of breath   . Pneumonia     2012  . Heart murmur   . Glaucoma   . Multiple myeloma,  without mention of having achieved remission 03/30/2012    Cytogenetic neg on 03/23/2012.  . End stage renal disease on dialysis   . Asthma   . Hyperparathyroidism, secondary renal   . Peripheral arterial disease     Past Surgical History  Procedure Laterality Date  . Thyroidectomy    . Cervical disc surgery    . Eye surgery      CATARACTS  . Insertion of dialysis catheter Right 03/19/2012    Procedure: INSERTION OF DIALYSIS CATHETER;  Surgeon: Mal Misty, MD;  Location: [facility];  Service: Vascular;  Laterality: Right;  Right Internal Jugular  . Av fistula placement Left 03/25/2012    Procedure: ARTERIOVENOUS (AV) FISTULA CREATION;  Surgeon: Mal Misty, MD;  Location: [place];  Service: Vascular;  Laterality: Left;  . Ligation of competing branches of arteriovenous fistula Left 05/08/2012    Procedure: LIGATION OF COMPETING BRANCHES OF ARTERIOVENOUS FISTULA;  Surgeon: Mal Misty, MD;  Location: [place];  Service: Vascular;  Laterality: Left;  Ultrasound guided  . Cardiac catheterization      approx 30 years ago  . Amputation Right 11/10/2012    Procedure: AMPUTATION FIRST and SECOND TOES Right Foot;  Surgeon: Elam Dutch, MD;  Location: [facility] OR;  Service: Vascular;  Laterality: Right;  . Transmetatarsal amputation Left 12/16/2012    Procedure: TRANSMETATARSAL AMPUTATION AND VAC PLACEMENT;  Surgeon: Elam Dutch, MD;  Location: [place];  Service: Vascular;  Laterality: Left;  . Amputation Left 04/07/2013    Procedure: AMPUTATION DIGIT- LEFT 1ST TOE;  Surgeon: Mal Misty, MD;  Location: [place];  Service: Vascular;  Laterality: Left;  ergies:   Allergies  Allergen Reactions  . Lisinopril Cough  . Ivp Dye [Iodinated Diagnostic Agents] Hives  . Morphine And Related Other (See Comments)    Bradycardia states patient     Medications: I have reviewed patients current medications as documented in Epic Anti-infectives   Start     Dose/Rate Route Frequency Ordered Stop   04/19/13  1345  nafcillin 2 g in dextrose 5 % 50 mL IVPB     2 g 100 mL/hr over 30 Minutes Intravenous 6 times per day 04/19/13 1313     04/19/13 1200  vancomycin (VANCOCIN) IVPB 1000 mg/200 mL premix     1,000 mg 200 mL/hr over 60 Minutes Intravenous Every M-W-F (Hemodialysis) 04/17/13 0245     04/17/13 0200  vancomycin (VANCOCIN) 2,000 mg in sodium chloride 0.9 % 500 mL IVPB     2,000 mg 250 mL/hr over 120 Minutes Intravenous  Once 04/17/13 0154 04/17/13 0627   04/17/13 0200  piperacillin-tazobactam (ZOSYN) IVPB 2.25 g  Status:  Discontinued     2.25 g 100 mL/hr over 30 Minutes Intravenous 3 times per day 04/17/13 0154 04/19/13 1312      Social History:  reports that he quit smoking about 31 years ago. His smoking use included Cigarettes. He smoked 0.00 packs per day. He has never used smokeless tobacco. He reports that he does not drink alcohol or use illicit drugs.  Family History  Problem Relation Age of Onset  . Diabetes Mother   . Cancer Mother     bone   . Kidney disease Father     As in HPI and primary teams notes otherwise 12 point review of systems is negative  Blood pressure 136/72, pulse 66, temperature 100.1 F (37.8 C), temperature source Oral, resp. rate 18, height _0  (1.778 m), weight 185 lb 10 oz (84.2 kg), SpO2 98.00%. General: Alert and awake, oriented x3, not in any acute distress. HEENT: anicteric sclera, pupils reactive to light and accommodation, EOMI, oropharynx clear and without exudate CVS regular rate, normal r, faint murmur LLSB Chest: clear to auscultation bilaterally, no wheezing, rales or rhonchi Abdomen: Hyperactive bowel sounds soft nontender, nondistended, normal bowel sounds, Extremities: Skin: see pictures:  Amputation site with darkening skin, sutures in place:     AV fistula site clean:    Right TMA site is clean and dry:      Neuro: nonfocal, strength and sensation intact   Results for orders placed during the hospital encounter  of 04/16/13 (from the past 48 hour(s))  GLUCOSE, CAPILLARY     Status: Abnormal   Collection Time    04/17/13  3:50 PM      Result Value Ref Range   Glucose-Capillary 128 (*) 70 - 99 mg/dL  GLUCOSE, CAPILLARY     Status: Abnormal   Collection Time    04/17/13  8:55 PM      Result Value Ref Range   Glucose-Capillary 170 (*) 70 - 99 mg/dL  GLUCOSE, CAPILLARY     Status: Abnormal   Collection Time    04/18/13  8:47 AM      Result Value Ref Range   Glucose-Capillary 137 (*) 70 - 99 mg/dL  GLUCOSE, CAPILLARY     Status: Abnormal   Collection Time    04/18/13  1:29 PM      Result Value Ref Range   Glucose-Capillary 144 (*)  70 - 99 mg/dL  GLUCOSE, CAPILLARY     Status: Abnormal   Collection Time    04/18/13  3:31 PM      Result Value Ref Range   Glucose-Capillary 132 (*) 70 - 99 mg/dL  GLUCOSE, CAPILLARY     Status: Abnormal   Collection Time    04/18/13  9:43 PM      Result Value Ref Range   Glucose-Capillary 112 (*) 70 - 99 mg/dL  GLUCOSE, CAPILLARY     Status: Abnormal   Collection Time    04/19/13  7:34 AM      Result Value Ref Range   Glucose-Capillary 117 (*) 70 - 99 mg/dL  GLUCOSE, CAPILLARY     Status: Abnormal   Collection Time    04/19/13 11:28 AM      Result Value Ref Range   Glucose-Capillary 112 (*) 70 - 99 mg/dL      Component Value Date/Time   SDES BLOOD RIGHT HAND 04/17/2013 0550   SPECREQUEST BOTTLES DRAWN AEROBIC AND ANAEROBIC 10CC BLUE,5CC RED 04/17/2013 0550   CULT  Value: STAPHYLOCOCCUS AUREUS Note: Gram Stain Report Called to,Read Back By and Verified With: Earleen Reaper RN on 04/18/13 at 05:50 by Rise Mu Performed at [facility] 04/17/2013 0550   REPTSTATUS PENDING 04/17/2013 0550   Mr Foot Left Wo Contrast  04/18/2013   CLINICAL DATA:  Recent great toe amputation. Surgical wound infection.  EXAM: MRI OF THE LEFT FOREFOOT WITHOUT CONTRAST  TECHNIQUE: Multiplanar, multisequence MR imaging was performed. No intravenous contrast was administered.   COMPARISON:  Radiograph dated 03/23/2013  FINDINGS: The great toe has been amputated. There is no bone destruction or edema in the first metatarsal. There is slight edema in the soft tissues adjacent to the head of the first metatarsal deep to the operative wound.  The other bones of the forefoot are essentially normal.  The adjacent muscles and tendons appear normal. No joint effusions. There is slight sclerosis and irregularity of the sesamoids at the head of the first metatarsal. This is not significant.  IMPRESSION: No evidence of abscess or osteomyelitis. Small amount of fluid adjacent to the head of the first metatarsal is consistent with normal postsurgical seroma.   Electronically Signed   By: Rozetta Nunnery M.D.   On: 04/18/2013 13:24   Mr Ankle Left  Wo Contrast  04/17/2013   CLINICAL DATA:  Fever, pain, swelling, and redness at the site of amputation of the left great toe. Elevated white blood count. Wound infection.  EXAM: MRI OF THE LEFT ANKLE WITHOUT CONTRAST  TECHNIQUE: Multiplanar, multisequence MR imaging of the ankle was performed. No intravenous contrast was administered.  COMPARISON:  Radiographs dated 03/23/2013  FINDINGS: There is circumferential edema in the subcutaneous tissues of the ankle and on the dorsum of the foot, nonspecific.  TENDONS  Peroneal: Normal.  Posteromedial: Normal.  Anterior: Normal.  Achilles: Normal.  Plantar Fascia: No acute abnormality. Chronic degenerative changes in the origin of the medial band of the plantar fascia.  LIGAMENTS  Lateral: Normal.  Medial: Normal.  .  CARTILAGE  Ankle Joint: Normal.  Subtalar Joints/Sinus Tarsi: Normal.  Bones: Minimal dorsal spurring on the distal talus. Minimal posterior spurring on the distal tibia.  IMPRESSION: No evidence of osteomyelitis or abscess. Edema in the subcutaneous tissues around the ankle and dorsum of the hindfoot, nonspecific.   Electronically Signed   By: Rozetta Nunnery M.D.   On: 04/17/2013 15:23     Recent  Results (from the past 720 hour(s))  SURGICAL PCR SCREEN     Status: None   Collection Time    04/17/13  5:10 AM      Result Value Ref Range Status   MRSA, PCR NEGATIVE  NEGATIVE Final   Staphylococcus aureus NEGATIVE  NEGATIVE Final   Comment:            The Xpert SA Assay (FDA     approved for NASAL specimens     in patients over 78 years of age),     is one component of     a comprehensive surveillance     program.  Test performance has     been validated by [employer] for patients greater     than or equal to 61 year old.     It is not intended     to diagnose infection nor to     guide or monitor treatment.  CULTURE, BLOOD (ROUTINE X 2)     Status: None   Collection Time    04/17/13  5:48 AM      Result Value Ref Range Status   Specimen Description BLOOD RIGHT HAND   Final   Special Requests BOTTLES DRAWN AEROBIC ONLY 5CC   Final   Culture  Setup Time     Final   Value: 04/17/2013 12:44     Performed at [facility]   Culture     Final   Value: GRAM POSITIVE COCCI IN CLUSTERS     Note: Gram Stain Report Called to,Read Back By and Verified With: ASHLEY LARSON 04/18/12 @ 10:45AM BY RUSCOE A.     Performed at [facility]   Report Status PENDING   Incomplete  CULTURE, BLOOD (ROUTINE X 2)     Status: None   Collection Time    04/17/13  5:50 AM      Result Value Ref Range Status   Specimen Description BLOOD RIGHT HAND   Final   Special Requests     Final   Value: BOTTLES DRAWN AEROBIC AND ANAEROBIC 10CC BLUE,5CC RED   Culture  Setup Time     Final   Value: 04/17/2013 12:44     Performed at [facility]   Culture     Final   Value: STAPHYLOCOCCUS AUREUS     Note: Gram Stain Report Called to,Read Back By and Verified With: Earleen Reaper RN on 04/18/13 at 05:50 by Rise Mu     Performed at [facility]   Report Status PENDING   Incomplete     Impression/Recommendation  Active Problems:   Essential hypertension, benign    Diabetes mellitus type 1, controlled, with complications   Wound infection after surgery   Fever, unspecified   Jack [name] is a 68 y.o. male with  Multiple myeloma, DM, ESRD on HD via AV fistula with gangrrene in right foot sp 1st amputation readmitted for infection at that site on abx and observation but NOW WITH SAB  #1      [place] Antimicrobial Management Team Staphylococcus aureus bacteremia   Staphylococcus aureus bacteremia (SAB) is associated with a high rate of complications and mortality.  Specific aspects of clinical management are critical to optimizing the outcome of patients with SAB.  Therefore, the [facility] Antimicrobial Management Team [facility]) has initiated an intervention aimed at improving the management of SAB at [facility].  To do so, Infectious Diseases physicians are  providing an evidence-based consult for the management of all patients with SAB.     Yes No Comments  Perform follow-up blood cultures (even if the patient is afebrile) to ensure clearance of bacteremia _0  _1    Remove vascular catheter and obtain follow-up blood cultures after the removal of the catheter _2  _3  No underlying today the fistula appears clean   Perform echocardiography to evaluate for endocarditis (transthoracic ECHO is 40-50% sensitive, TEE is > 90% sensitive) _4  _5  Please keep in mind, that neither test can definitively EXCLUDE endocarditis, and that should clinical suspicion remain high for endocarditis the patient should then still be treated with an "endocarditis" duration of therapy = 6 weeks  Will page Cards master for TEE tomorrow  Consult electrophysiologist to evaluate implanted cardiac device (pacemaker, ICD) _6  _7  NA  Ensure source control _8  _9  Have all abscesses been drained effectively? Have deep seeded infections (septic joints or osteomyelitis) had appropriate surgical debridement?  I REALLY FEEL STRONGLY THAT PRESENCE OF SAB SHOULD FORCE [place] TO RECONSIDER NEED  FOR RE-EXPLORATION OR MORE PROXIMAL AMPUTATION IN FOOT AS CONSEQUENCES OF FAILING TO CONTROL HIS BACTEREMIA WITH EVEN PROTRACTED IV ABX COULD MEAN RECURRENCE, INFECTION , REINFECTION OF HEART VALVES, AV FISTULA  Investigate for "metastatic" sites of infection _10  _11  Does the patient have ANY symptom or physical exam finding that would suggest a deeper infection (back or neck pain that may be suggestive of vertebral osteomyelitis or epidural abscess, muscle pain that could be a symptom of pyomyositis)?  Keep in mind that for deep seeded infections MRI imaging with contrast is preferred rather than other often insensitive tests such as plain will likely get an MRI of the brain      x-rays, especially early in a patient's presentation.  Change antibiotic therapy to nafcillin and vancomycin  _12  _13  Beta-lactam antibiotics are preferred for MSSA due to higher cure rates.   If on Vancomycin, goal trough should be 15 - 20 mcg/mL  Estimated duration of IV antibiotic therapy: 6-8 WEEKS  _14  _15  Consult case management for probably prolonged outpatient IV antibiotic therapy   #2 Amputation site: see above, I think presence of SAB should make [place] err on the side of overly aggressive therapy with re to his amputation site. Undertreatment surgically  Could have much more dire consequences. Will discuss further with [facility] as well.  I spent greater than 45 minutes with the patient including greater than 50% of time in face to face counsel of the patient and his wife and in coordination of their care.  #3 Screening: check HIV and hep c   04/19/2013, 1:54 PM   Thank you so much for this interesting consult  [facility] for [place] [phone] (pager) [phone] (office) 04/19/2013, 1:54 PM  [place] 04/19/2013, 1:54 PM

## 2013-04-19 NOTE — Progress Notes (Signed)
Patient ID: Jack [name], male   DOB: January 01, 1946, 68 y.o.   MRN: [id number] The patient is comfortable today. Denies pain in this left foot. His left great toe amputation looks unchanged. There is no tenderness in the metatarsal head. He does have some skin breakdown but no evidence of fluctuance. His temperature curve is somewhat down. He did have a fever to 101 at 11 PM but has been afebrile since. Discussed with the patient and his daughter present. We'll allow him to eat today. No indication for opening his wound. We'll see again in the morning

## 2013-04-19 NOTE — Progress Notes (Signed)
Discussed risks and benefits of TEE with pt and he is agreeable to proceed. Scheduled for 14:30 with Dr Johnsie Cancel. NPO after 6 am.  Kerin Ransom PA-C 04/19/2013 4:43 PM

## 2013-04-20 ENCOUNTER — Encounter (HOSPITAL_COMMUNITY): Payer: Self-pay | Admitting: *Deleted

## 2013-04-20 ENCOUNTER — Encounter (HOSPITAL_COMMUNITY)
Admission: EM | Disposition: A | Discharge status: Home Health | Payer: Self-pay | Source: Home / Self Care | Attending: Internal Medicine

## 2013-04-20 DIAGNOSIS — I319 Disease of pericardium, unspecified: Secondary | ICD-10-CM

## 2013-04-20 DIAGNOSIS — S98119A Complete traumatic amputation of unspecified great toe, initial encounter: Secondary | ICD-10-CM

## 2013-04-20 DIAGNOSIS — Z992 Dependence on renal dialysis: Secondary | ICD-10-CM

## 2013-04-20 DIAGNOSIS — A419 Sepsis, unspecified organism: Secondary | ICD-10-CM

## 2013-04-20 DIAGNOSIS — Y849 Medical procedure, unspecified as the cause of abnormal reaction of the patient, or of later complication, without mention of misadventure at the time of the procedure: Secondary | ICD-10-CM

## 2013-04-20 DIAGNOSIS — I120 Hypertensive chronic kidney disease with stage 5 chronic kidney disease or end stage renal disease: Secondary | ICD-10-CM

## 2013-04-20 DIAGNOSIS — C9000 Multiple myeloma not having achieved remission: Secondary | ICD-10-CM

## 2013-04-20 DIAGNOSIS — A4101 Sepsis due to Methicillin susceptible Staphylococcus aureus: Secondary | ICD-10-CM

## 2013-04-20 DIAGNOSIS — R7881 Bacteremia: Secondary | ICD-10-CM

## 2013-04-20 DIAGNOSIS — A4902 Methicillin resistant Staphylococcus aureus infection, unspecified site: Secondary | ICD-10-CM

## 2013-04-20 DIAGNOSIS — I369 Nonrheumatic tricuspid valve disorder, unspecified: Secondary | ICD-10-CM

## 2013-04-20 HISTORY — PX: TEE WITHOUT CARDIOVERSION: SHX5443

## 2013-04-20 LAB — CULTURE, BLOOD (ROUTINE X 2)

## 2013-04-20 LAB — GLUCOSE, CAPILLARY
GLUCOSE-CAPILLARY: 159 mg/dL — AB (ref 70–99)
GLUCOSE-CAPILLARY: 144 mg/dL — AB (ref 70–99)
GLUCOSE-CAPILLARY: 248 mg/dL — AB (ref 70–99)
Glucose-Capillary: 154 mg/dL — ABNORMAL HIGH (ref 70–99)

## 2013-04-20 LAB — HEPATITIS C ANTIBODY (REFLEX): HCV Ab: NEGATIVE

## 2013-04-20 SURGERY — ECHOCARDIOGRAM, TRANSESOPHAGEAL
Anesthesia: Moderate Sedation

## 2013-04-20 MED ORDER — SODIUM CHLORIDE 0.9 % IV SOLN: 100.0000 mL | INTRAVENOUS | Status: DC | PRN

## 2013-04-20 MED ORDER — FENTANYL CITRATE 0.05 MG/ML IJ SOLN
INTRAMUSCULAR | Status: AC
  Filled 2013-04-20: qty 2

## 2013-04-20 MED ORDER — HEPARIN SODIUM (PORCINE) 1000 UNIT/ML DIALYSIS: 1000.0000 [IU] | INTRAMUSCULAR | Status: DC | PRN

## 2013-04-20 MED ORDER — MIDAZOLAM HCL 10 MG/2ML IJ SOLN
INTRAMUSCULAR | Status: DC | PRN
  Administered 2013-04-20: 2 mg via INTRAVENOUS
  Administered 2013-04-20: 1 mg via INTRAVENOUS
  Administered 2013-04-20: 2 mg via INTRAVENOUS

## 2013-04-20 MED ORDER — FLUMAZENIL 0.5 MG/5ML IV SOLN
0.4000 mg | Freq: Once | INTRAVENOUS | Status: AC
  Administered 2013-04-20: 0.4 mg via INTRAVENOUS

## 2013-04-20 MED ORDER — PENTAFLUOROPROP-TETRAFLUOROETH EX AERO: 1.0000 "application " | INHALATION_SPRAY | CUTANEOUS | Status: DC | PRN

## 2013-04-20 MED ORDER — LIDOCAINE HCL (PF) 1 % IJ SOLN: 5.0000 mL | INTRAMUSCULAR | Status: DC | PRN

## 2013-04-20 MED ORDER — ALTEPLASE 2 MG IJ SOLR
2.0000 mg | Freq: Once | INTRAMUSCULAR | Status: AC | PRN
  Filled 2013-04-20: qty 2

## 2013-04-20 MED ORDER — BUTAMBEN-TETRACAINE-BENZOCAINE 2-2-14 % EX AERO
INHALATION_SPRAY | CUTANEOUS | Status: DC | PRN
  Administered 2013-04-20: 2 via TOPICAL

## 2013-04-20 MED ORDER — FENTANYL CITRATE 0.05 MG/ML IJ SOLN
INTRAMUSCULAR | Status: DC | PRN
  Administered 2013-04-20 (×2): 25 ug via INTRAVENOUS

## 2013-04-20 MED ORDER — LIDOCAINE-PRILOCAINE 2.5-2.5 % EX CREA: 1.0000 "application " | TOPICAL_CREAM | CUTANEOUS | Status: DC | PRN

## 2013-04-20 MED ORDER — MIDAZOLAM HCL 5 MG/ML IJ SOLN
INTRAMUSCULAR | Status: AC
  Filled 2013-04-20: qty 2

## 2013-04-20 MED ORDER — NEPRO/CARBSTEADY PO LIQD: 237.0000 mL | ORAL | Status: DC | PRN

## 2013-04-20 NOTE — Progress Notes (Signed)
Subjective: Interval History: none..  Denies any foot pain this morning.  Objective: Vital signs in last 24 hours: Temp:  [98.2 F (36.8 C)-100.6 F (38.1 C)] 99.7 F (37.6 C) (04/07 0553) Pulse Rate:  [62-74] 62 (04/07 0553) Resp:  [16-23] 18 (04/07 0553) BP: (136-180)/(57-80) 141/59 mmHg (04/07 0553) SpO2:  [92 %-100 %] 100 % (04/07 0553) Weight:  [180 lb 5.4 oz (81.8 kg)-185 lb 10 oz (84.2 kg)] 180 lb 5.4 oz (81.8 kg) (04/06 1907)  Intake/Output from previous day: 04/06 0701 - 04/07 0700 In: 0  Out: 2470  Intake/Output this shift:    No fluctuance and a great toe amputation site. Does have changes with a dry gangrenous changes over the skin edge itself which has progressed from yesterday. No erythema and no fluctuance present. No tenderness at his metatarsal head or into the arch of his foot.  Lab Results:  Recent Labs  04/19/13 1310 04/19/13 1501  WBC 10.9* 10.2  HGB 9.8* 10.4*  HCT 31.4* 33.0*  PLT 210 189   BMET  Recent Labs  04/19/13 1501  NA 141  K 4.0  CL 92*  CO2 24  GLUCOSE 135*  BUN 54*  CREATININE 9.47*  CALCIUM 9.1    Studies/Results: Mr Foot Left Wo Contrast  04/18/2013   CLINICAL DATA:  Recent great toe amputation. Surgical wound infection.  EXAM: MRI OF THE LEFT FOREFOOT WITHOUT CONTRAST  TECHNIQUE: Multiplanar, multisequence MR imaging was performed. No intravenous contrast was administered.  COMPARISON:  Radiograph dated 03/23/2013  FINDINGS: The great toe has been amputated. There is no bone destruction or edema in the first metatarsal. There is slight edema in the soft tissues adjacent to the head of the first metatarsal deep to the operative wound.  The other bones of the forefoot are essentially normal.  The adjacent muscles and tendons appear normal. No joint effusions. There is slight sclerosis and irregularity of the sesamoids at the head of the first metatarsal. This is not significant.  IMPRESSION: No evidence of abscess or osteomyelitis.  Small amount of fluid adjacent to the head of the first metatarsal is consistent with normal postsurgical seroma.   Electronically Signed   By: Rozetta Nunnery M.D.   On: 04/18/2013 13:24   Mr Ankle Left  Wo Contrast  04/17/2013   CLINICAL DATA:  Fever, pain, swelling, and redness at the site of amputation of the left great toe. Elevated white blood count. Wound infection.  EXAM: MRI OF THE LEFT ANKLE WITHOUT CONTRAST  TECHNIQUE: Multiplanar, multisequence MR imaging of the ankle was performed. No intravenous contrast was administered.  COMPARISON:  Radiographs dated 03/23/2013  FINDINGS: There is circumferential edema in the subcutaneous tissues of the ankle and on the dorsum of the foot, nonspecific.  TENDONS  Peroneal: Normal.  Posteromedial: Normal.  Anterior: Normal.  Achilles: Normal.  Plantar Fascia: No acute abnormality. Chronic degenerative changes in the origin of the medial band of the plantar fascia.  LIGAMENTS  Lateral: Normal.  Medial: Normal.  .  CARTILAGE  Ankle Joint: Normal.  Subtalar Joints/Sinus Tarsi: Normal.  Bones: Minimal dorsal spurring on the distal talus. Minimal posterior spurring on the distal tibia.  IMPRESSION: No evidence of osteomyelitis or abscess. Edema in the subcutaneous tissues around the ankle and dorsum of the hindfoot, nonspecific.   Electronically Signed   By: Rozetta Nunnery M.D.   On: 04/17/2013 15:23   Dg Bone Survey Met  03/23/2013   CLINICAL DATA:  History of multiple myeloma, no  current complaints  EXAM: METASTATIC BONE SURVEY  COMPARISON:  DG BONE SURVEY MET dated 03/20/2012; CT HEAD W/O CM dated 02/23/2013; DG BONE SURVEY MET dated 01/28/2011  FINDINGS: There is a grossly unchanged approximately 8 mm lytic lesion within the mid aspect of the calvarium, best seen on the provided lateral radiograph, likely unchanged since the prior examination.  Apparent development of a ill-defined lytic lesion within the proximal diaphysis of the right humerus measure approximately 8 mm  in diameter.  Vertebral body heights appear grossly preserved. Post C3-C4 ACDF without definite evidence of hardware failure or loosening. Moderate DDD of C2-C3 and L4-L5 with disc space height loss, endplate irregularities sclerosis. There is partial ossification of the nuchal ligament.  Mild to moderate degenerative change of the bilateral AC and glenohumeral joints.  Vascular calcifications.  Grossly unchanged enlarged cardiac silhouette and mediastinal contours with atherosclerotic plaque within the thoracic aorta. There is mild elevation the right hemidiaphragm. Interval removal of right jugular approach dialysis catheter. Surgical clips are seen within the left antecubital fossa, possibly at the location of a dialysis graft or fistula.  IMPRESSION: 1. Possible new approximately 8 mm lytic lesion within the proximal diaphysis of the right humerus. 2. Grossly unchanged approximately 8 mm possible lytic lesion within the calvarium.   Electronically Signed   By: Sandi Mariscal M.D.   On: 03/23/2013 12:01   Dg Toe Great Left  03/23/2013   CLINICAL DATA:  Nonhealing wound  EXAM: LEFT GREAT TOE  COMPARISON:  None.  FINDINGS: There is a soft tissue defect about the distal tip of the great digit. While this soft tissue defect appears to nearly abut the tuft of the distal phalanx, this finding is without definitive associated osteolysis to suggest osteomyelitis. No radiopaque foreign body. No subcutaneous emphysema.  No fracture or dislocation. Vascular calcifications. Joint spaces are preserved.  IMPRESSION: Soft tissue defect nearly abuts the tuft of the great toe but is without definitive evidence of osteomyelitis. Further evaluation with MRI could be performed as clinically indicated.   Electronically Signed   By: Sandi Mariscal M.D.   On: 03/23/2013 12:05   Anti-infectives: Anti-infectives   Start     Dose/Rate Route Frequency Ordered Stop   04/19/13 1345  nafcillin 2 g in dextrose 5 % 50 mL IVPB     2 g 100  mL/hr over 30 Minutes Intravenous 6 times per day 04/19/13 1313     04/19/13 1200  vancomycin (VANCOCIN) IVPB 1000 mg/200 mL premix     1,000 mg 200 mL/hr over 60 Minutes Intravenous Every M-W-F (Hemodialysis) 04/17/13 0245     04/17/13 0200  vancomycin (VANCOCIN) 2,000 mg in sodium chloride 0.9 % 500 mL IVPB     2,000 mg 250 mL/hr over 120 Minutes Intravenous  Once 04/17/13 0154 04/17/13 0627   04/17/13 0200  piperacillin-tazobactam (ZOSYN) IVPB 2.25 g  Status:  Discontinued     2.25 g 100 mL/hr over 30 Minutes Intravenous 3 times per day 04/17/13 0154 04/19/13 1312      Assessment/Plan: s/p Procedure(s): TRANSESOPHAGEAL ECHOCARDIOGRAM (TEE) (N/A) Fever curve continues down. MAXIMUM TEMPERATURE 100.6 her last 24 hours and afebrile at 98.7 this morning. No evidence of invasive foot infection. He is having some breakdown of the skin line incision. I discussed this with the patient who understands. Dr. Kellie Simmering will return for postop followup tomorrow.   LOS: 4 days   Afsa Meany 04/20/2013, 8:38 AM

## 2013-04-20 NOTE — Interval H&P Note (Signed)
History and Physical Interval Note:  04/20/2013 11:16 AM  Jack [name]  has presented today for surgery, with the diagnosis of endocarditis  The various methods of treatment have been discussed with the patient and family. After consideration of risks, benefits and other options for treatment, the patient has consented to  Procedure(s): TRANSESOPHAGEAL ECHOCARDIOGRAM (TEE) (N/A) as a surgical intervention .  The patient's history has been reviewed, patient examined, no change in status, stable for surgery.  I have reviewed the patient's chart and labs.  Questions were answered to the patient's satisfaction.     Jenkins Rouge

## 2013-04-20 NOTE — Progress Notes (Signed)
[facility] Infect[facility]    Subjective: No new complaints   Antibiotics:  Anti-infectives   Start     Dose/Rate Route Frequency Ordered Stop   04/19/13 1345  nafcillin 2 g in dextrose 5 % 50 mL IVPB  Status:  Discontinued     2 g 100 mL/hr over 30 Minutes Intravenous 6 times per day 04/19/13 1313 04/20/13 1518   04/19/13 1200  vancomycin (VANCOCIN) IVPB 1000 mg/200 mL premix     1,000 mg 200 mL/hr over 60 Minutes Intravenous Every M-W-F (Hemodialysis) 04/17/13 0245     04/17/13 0200  vancomycin (VANCOCIN) 2,000 mg in sodium chloride 0.9 % 500 mL IVPB     2,000 mg 250 mL/hr over 120 Minutes Intravenous  Once 04/17/13 0154 04/17/13 0627   04/17/13 0200  piperacillin-tazobactam (ZOSYN) IVPB 2.25 g  Status:  Discontinued     2.25 g 100 mL/hr over 30 Minutes Intravenous 3 times per day 04/17/13 0154 04/19/13 1312      Medications: Scheduled Meds: . colchicine  0.6 mg Oral Daily  . doxazosin  2 mg Oral QHS  . doxercalciferol  1 mcg Intravenous Q M,W,F-HD  . feeding supplement (NEPRO CARB STEADY)  237 mL Oral BID BM  . heparin  5,000 Units Subcutaneous 3 times per day  . insulin aspart  0-5 Units Subcutaneous QHS  . insulin aspart  0-9 Units Subcutaneous TID WC  . levothyroxine  175 mcg Oral QAC breakfast  . multivitamin  1 tablet Oral QHS  . nebivolol  5 mg Oral QHS  . sodium chloride  3 mL Intravenous Q12H  . vancomycin  1,000 mg Intravenous Q M,W,F-HD   Continuous Infusions:  PRN Meds:.sodium chloride, sodium chloride, sodium chloride, sodium chloride, sodium chloride, acetaminophen, acetaminophen, alteplase, feeding supplement (NEPRO CARB STEADY), feeding supplement (NEPRO CARB STEADY), heparin, heparin, heparin, lidocaine (PF), lidocaine (PF), lidocaine-prilocaine, lidocaine-prilocaine, ondansetron (ZOFRAN) IV, ondansetron, oxyCODONE, pentafluoroprop-tetrafluoroeth, pentafluoroprop-tetrafluoroeth, sodium chloride    Objective: Weight change: 0 lb (0  kg)  Intake/Output Summary (Last 24 hours) at 04/20/13 1826 Last data filed at 04/20/13 1300  Gross per 24 hour  Intake      0 ml  Output   2470 ml  Net  -2470 ml   Blood pressure 131/63, pulse 57, temperature 98.4 F (36.9 C), temperature source Oral, resp. rate 20, height '5\' 10"'  (1.778 m), weight 180 lb 5.4 oz (81.8 kg), SpO2 99.00%. Temp:  [98.4 F (36.9 C)-100.6 F (38.1 C)] 98.4 F (36.9 C) (04/07 1350) Pulse Rate:  [57-74] 57 (04/07 1350) Resp:  [16-25] 20 (04/07 1350) BP: (85-166)/(42-77) 131/63 mmHg (04/07 1350) SpO2:  [94 %-100 %] 99 % (04/07 1350) Weight:  [180 lb 5.4 oz (81.8 kg)] 180 lb 5.4 oz (81.8 kg) (04/06 1907)  Physical Exam: General: Alert and awake, oriented x3, not in any acute distress.  HEENT: anicteric sclera, pupils reactive to light and accommodation, EOMI, oropharynx clear and without exudate  CVS regular rate, normal r, faint murmur LLSB  Chest: clear to auscultation bilaterally, no wheezing, rales or rhonchi  Abdomen: Hyperactive bowel sounds soft nontender, nondistended, normal bowel sounds,  Extremities: Skin: see pictures:  Amputation site with darkening skin, sutures in place:  See sequential pictures:  Picture 04/19/13:       Picture 04/20/13       CBC:   Recent Labs  04/19/13 1310 04/19/13 1501  WBC 10.9* 10.2  HGB 9.8* 10.4*  HCT 31.4* 33.0*  NA  --  141  K  --  4.0  CL  --  92*  CO2  --  24  BUN  --  54*  CREATININE  --  9.47*    BMET  Recent Labs  04/19/13 1501  NA 141  K 4.0  CL 92*  CO2 24  GLUCOSE 135*  BUN 54*  CREATININE 9.47*  CALCIUM 9.1     Liver Panel   Recent Labs  04/19/13 1501  ALBUMIN 2.2*       Sedimentation Rate No results found for this basename: ESRSEDRATE,  in the last 72 hours C-Reactive Protein No results found for this basename: CRP,  in the last 72 hours  Micro Results: Recent Results (from the past 240 hour(s))  SURGICAL PCR SCREEN     Status: None   Collection  Time    04/17/13  5:10 AM      Result Value Ref Range Status   MRSA, PCR NEGATIVE  NEGATIVE Final   Staphylococcus aureus NEGATIVE  NEGATIVE Final   Comment:            The Xpert SA Assay (FDA     approved for NASAL specimens     in patients over 98 years of age),     is one component of     a comprehensive surveillance     program.  Test performance has     been validated by [employer] for patients greater     than or equal to 51 year old.     It is not intended     to diagnose infection nor to     guide or monitor treatment.  CULTURE, BLOOD (ROUTINE X 2)     Status: None   Collection Time    04/17/13  5:48 AM      Result Value Ref Range Status   Specimen Description BLOOD RIGHT HAND   Final   Special Requests BOTTLES DRAWN AEROBIC ONLY 5CC   Final   Culture  Setup Time     Final   Value: 04/17/2013 12:44     Performed at [facility]   Culture     Final   Value: METHICILLIN RESISTANT STAPHYLOCOCCUS AUREUS     Note: RIFAMPIN AND GENTAMICIN SHOULD NOT BE USED AS SINGLE DRUGS FOR TREATMENT OF STAPH INFECTIONS. This organism DOES NOT demonstrate inducible Clindamycin resistance in vitro. CRITICAL RESULT CALLED TO, READ BACK BY AND VERIFIED WITH: JAMIE COVINGTON      04/19/13 1437 BY SMITHERSJ     Note: Gram Stain Report Called to,Read Back By and Verified With: Virgilio Frees 04/18/12 @ 10:45AM BY RUSCOE A.     Performed at [facility]   Report Status 04/20/2013 FINAL   Final   Organism ID, Bacteria METHICILLIN RESISTANT STAPHYLOCOCCUS AUREUS   Final  CULTURE, BLOOD (ROUTINE X 2)     Status: None   Collection Time    04/17/13  5:50 AM      Result Value Ref Range Status   Specimen Description BLOOD RIGHT HAND   Final   Special Requests     Final   Value: BOTTLES DRAWN AEROBIC AND ANAEROBIC 10CC BLUE,5CC RED   Culture  Setup Time     Final   Value: 04/17/2013 12:44     Performed at [facility]   Culture     Final   Value: STAPHYLOCOCCUS AUREUS      Note: SUSCEPTIBILITIES PERFORMED ON PREVIOUS CULTURE  WITHIN THE LAST 5 DAYS.     Note: Gram Stain Report Called to,Read Back By and Verified With: Earleen Reaper RN on 04/18/13 at 05:50 by Rise Mu     Performed at [facility]   Report Status 04/20/2013 FINAL   Final  CULTURE, BLOOD (ROUTINE X 2)     Status: None   Collection Time    04/19/13  1:04 PM      Result Value Ref Range Status   Specimen Description BLOOD RIGHT HAND   Final   Special Requests BOTTLES DRAWN AEROBIC ONLY [facility]   Final   Culture  Setup Time     Final   Value: 04/19/2013 16:06     Performed at [facility]   Culture     Final   Value:        BLOOD CULTURE RECEIVED NO GROWTH TO DATE CULTURE WILL BE HELD FOR 5 DAYS BEFORE ISSUING A FINAL NEGATIVE REPORT     Performed at [facility]   Report Status PENDING   Incomplete  CULTURE, BLOOD (ROUTINE X 2)     Status: None   Collection Time    04/19/13  1:10 PM      Result Value Ref Range Status   Specimen Description BLOOD RIGHT ANTECUBITAL   Final   Special Requests BOTTLES DRAWN AEROBIC ONLY 5CC   Final   Culture  Setup Time     Final   Value: 04/19/2013 16:06     Performed at [facility]   Culture     Final   Value:        BLOOD CULTURE RECEIVED NO GROWTH TO DATE CULTURE WILL BE HELD FOR 5 DAYS BEFORE ISSUING A FINAL NEGATIVE REPORT     Performed at [facility]   Report Status PENDING   Incomplete    Studies/Results: No results found.    Assessment/Plan:  Active Problems:   Essential hypertension, benign   Diabetes mellitus type 1, controlled, with complications   Wound infection after surgery   Fever, unspecified    Jack [name] is a 68 y.o. male with  Multiple myeloma, DM, ESRD on HD via AV fistula with gangrrene in right foot sp 1st amputation readmitted for infection at that site on abx and observation but NOW WITH MRSA bacteremia.  #1  [facility] Staphylococcus aureus  bacteremia   Staphylococcus aureus bacteremia (SAB) is associated with a high rate of complications and mortality. Specific aspects of clinical management are critical to optimizing the outcome of patients with SAB. Therefore, the [facility] Antimicrobial Management Team [facility]) has initiated an intervention aimed at improving the management of SAB at [facility]. To do so, Infectious Diseases physicians are providing an evidence-based consult for the management of all patients with SAB.      Yes  No  Comments   Perform follow-up blood cultures (even if the patient is afebrile) to ensure clearance of bacteremia  '[X]'   '[ ]'   Performed on 04/19/13  Remove vascular catheter and obtain follow-up blood cultures after the removal of the catheter  '[ ]'   '[ ]'   No line in place today the fistula appears clean   Perform echocardiography to evaluate for endocarditis (transthoracic ECHO is 40-50% sensitive, TEE is > 90% sensitive)  '[X]'   '[ ]'   TEE without vegetations  Consult electrophysiologist to evaluate implanted cardiac device (pacemaker, ICD)  '[ ]'   '[ ]'   NA   Ensure  source control  '[ ]'   '[X]'   Have all abscesses been drained effectively?  Have deep seeded infections (septic joints or osteomyelitis) had appropriate surgical debridement?  I REALLY FEEL STRONGLY THAT PRESENCE OF SAB SHOULD FORCE [place] TO RECONSIDER NEED FOR RE-EXPLORATION OR MORE PROXIMAL AMPUTATION IN FOOT AS CONSEQUENCES OF FAILING TO CONTROL HIS BACTEREMIA WITH EVEN PROTRACTED IV ABX COULD MEAN RECURRENCE, INFECTION , REINFECTION OF HEART VALVES, AV FISTULA   Investigate for "metastatic" sites of infection  '[X]'   '[ ]'   Does the patient have ANY symptom or physical exam finding that would suggest a deeper infection (back or neck pain that may be suggestive of vertebral osteomyelitis or epidural abscess, muscle pain that could be a symptom of pyomyositis)?  Keep in mind that for deep seeded infections MRI imaging with contrast is preferred rather than other  often insensitive tests such as plain will consider MRI of the brain  x-rays, especially early in a patient's presentation.   Change antibiotic therapy to  vancomycin  '[X]'   '[ ]'   Beta-lactam antibiotics are preferred for MSSA due to higher cure rates.  If on Vancomycin, goal trough should be 15 - 20 mcg/mL   Estimated duration of IV antibiotic therapy: 6-8 WEEKS  '[X]'   '[ ]'   Consult case management for probably prolonged outpatient IV antibiotic therapy    #2 Amputation site: see above, I think presence of SAB should make [place] err on the side of overly aggressive therapy with re to his amputation site. Undertreatment surgically Could have much more dire consequences. Will discuss further with [facility] as well.    #3 Screening: check HIV and hep c (negative)     LOS: 4 days   Alcide Evener 04/20/2013, 6:26 PM

## 2013-04-20 NOTE — Progress Notes (Signed)
[facility] PROGRESS NOTE Interim History: Jack [name]. He has a very complex medical history with diabetic foot infection. He was found to have gangrene of his left great toe. 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 He underwent amputation of his left great toe by Dr. Kellie Simmering on 04/07/2013   Assessment/Plan: Fever, due to bacteremia: - Started on Vanc and zosyn on 4.4.2015.d/c zosyn on 4.6.2015. - has defervesce, MRI No evidence of abscess or osteomyelitis, amount of fluid adjacent to the head of the first metatarsal  - BC 2/2 gram positive cocci in cluster. - TEE 4.7.2015 showed no SBE, med [facility] 65%, repeated blood cultures.  Diabetes mellitus type 1, controlled, with complications - Controlled. - cont SSI.  Essential hypertension, benign - stable cont home emds.  ESRD: - per renal for HD M,W,F.  Anemia of chronic disease; - per renal.  Code Status: full Family Communication: daughters  Disposition Plan: inpatinet   Consultants:  Renal  Vascular  Procedures:  MRI  Antibiotics:  vanc zosyn   HPI/Subjective: no complains  Objective: Filed Vitals:   04/20/13 1120 04/20/13 1125 04/20/13 1130 04/20/13 1143  BP:  148/61 139/53   Pulse: 60 65 70   Temp:    98.7 F (37.1 C)  TempSrc:    Oral  Resp: 20 18 25    Height:      Weight:      SpO2: 100% 100% 100%     Intake/Output Summary (Last 24 hours) at 04/20/13 1149 Last data filed at 04/19/13 1907  Gross per 24 hour  Intake      0 ml  Output   2470 ml  Net  -2470 ml   Filed Weights   04/18/13 2145 04/19/13 1447 04/19/13 1907  Weight: 84.2 kg (185 lb 10 oz) 84.2 kg (185 lb 10 oz) 81.8 kg (180 lb 5.4 oz)    Exam:  General: Alert, awake, oriented x3, in no acute distress.  HEENT: No bruits, no goiter.  Heart: Regular rate and rhythm, without murmurs, rubs, gallops.  Lungs: Good air movement, clear Abdomen: Soft, nontender, nondistended,  positive bowel sounds.   Data Reviewed: Basic Metabolic Panel:  Recent Labs Lab 04/16/13 2017 04/17/13 0550 04/19/13 1501  NA 138 137 141  K 3.1* 3.4* 4.0  CL 93* 91* 92*  CO2 30 26 24   GLUCOSE 230* 150* 135*  BUN 18 21 54*  CREATININE 4.00* 4.92*  4.73* 9.47*  CALCIUM 8.6 8.9 9.1  PHOS  --   --  5.7*   Liver Function Tests:  Recent Labs Lab 04/19/13 1501  ALBUMIN 2.2*   No results found for this basename: LIPASE, AMYLASE,  in the last 168 hours No results found for this basename: AMMONIA,  in the last 168 hours CBC:  Recent Labs Lab 04/16/13 2017 04/17/13 0550 04/19/13 1310 04/19/13 1501  WBC 14.0* 14.8*  14.7* 10.9* 10.2  NEUTROABS 12.3*  --  8.0*  --   HGB 11.0* 11.2*  11.1* 9.8* 10.4*  HCT 35.4* 36.2*  36.2* 31.4* 33.0*  MCV 86.8 86.6  86.8 85.3 85.3  PLT 197 207  217 210 189   Cardiac Enzymes: No results found for this basename: CKTOTAL, CKMB, CKMBINDEX, TROPONINI,  in the last 168 hours BNP (last 3 results) No results found for this basename: PROBNP,  in the last 8760 hours CBG:  Recent Labs Lab 04/19/13 0734 04/19/13 1128 04/19/13 2234 04/20/13 0808 04/20/13 1144  GLUCAP 117* 112* 186* 154* 159*  Recent Results (from the past 240 hour(s))  SURGICAL PCR SCREEN     Status: None   Collection Time    04/17/13  5:10 AM      Result Value Ref Range Status   MRSA, PCR NEGATIVE  NEGATIVE Final   Staphylococcus aureus NEGATIVE  NEGATIVE Final   Comment:            The Xpert SA Assay (FDA     approved for NASAL specimens     in patients over 25 years of age),     is one component of     a comprehensive surveillance     program.  Test performance has     been validated by [employer] for patients greater     than or equal to 36 year old.     It is not intended     to diagnose infection nor to     guide or monitor treatment.  CULTURE, BLOOD (ROUTINE X 2)     Status: None   Collection Time    04/17/13  5:48 AM      Result Value  Ref Range Status   Specimen Description BLOOD RIGHT HAND   Final   Special Requests BOTTLES DRAWN AEROBIC ONLY 5CC   Final   Culture  Setup Time     Final   Value: 04/17/2013 12:44     Performed at [facility]   Culture     Final   Value: METHICILLIN RESISTANT STAPHYLOCOCCUS AUREUS     Note: RIFAMPIN AND GENTAMICIN SHOULD NOT BE USED AS SINGLE DRUGS FOR TREATMENT OF STAPH INFECTIONS. This organism DOES NOT demonstrate inducible Clindamycin resistance in vitro. CRITICAL RESULT CALLED TO, READ BACK BY AND VERIFIED WITH: JAMIE COVINGTON      04/19/13 1437 BY SMITHERSJ     Note: Gram Stain Report Called to,Read Back By and Verified With: Virgilio Frees 04/18/12 @ 10:45AM BY RUSCOE A.     Performed at [facility]   Report Status 04/20/2013 FINAL   Final   Organism ID, Bacteria METHICILLIN RESISTANT STAPHYLOCOCCUS AUREUS   Final  CULTURE, BLOOD (ROUTINE X 2)     Status: None   Collection Time    04/17/13  5:50 AM      Result Value Ref Range Status   Specimen Description BLOOD RIGHT HAND   Final   Special Requests     Final   Value: BOTTLES DRAWN AEROBIC AND ANAEROBIC 10CC BLUE,5CC RED   Culture  Setup Time     Final   Value: 04/17/2013 12:44     Performed at [facility]   Culture     Final   Value: STAPHYLOCOCCUS AUREUS     Note: SUSCEPTIBILITIES PERFORMED ON PREVIOUS CULTURE WITHIN THE LAST 5 DAYS.     Note: Gram Stain Report Called to,Read Back By and Verified With: Earleen Reaper RN on 04/18/13 at 05:50 by Rise Mu     Performed at [facility]   Report Status 04/20/2013 FINAL   Final  CULTURE, BLOOD (ROUTINE X 2)     Status: None   Collection Time    04/19/13  1:04 PM      Result Value Ref Range Status   Specimen Description BLOOD RIGHT HAND   Final   Special Requests BOTTLES DRAWN AEROBIC ONLY [facility]   Final   Culture  Setup Time     Final   Value: 04/19/2013 16:06  Performed at [facility]     Final   Value:        BLOOD  CULTURE RECEIVED NO GROWTH TO DATE CULTURE WILL BE HELD FOR 5 DAYS BEFORE ISSUING A FINAL NEGATIVE REPORT     Performed at [facility]   Report Status PENDING   Incomplete  CULTURE, BLOOD (ROUTINE X 2)     Status: None   Collection Time    04/19/13  1:10 PM      Result Value Ref Range Status   Specimen Description BLOOD RIGHT ANTECUBITAL   Final   Special Requests BOTTLES DRAWN AEROBIC ONLY 5CC   Final   Culture  Setup Time     Final   Value: 04/19/2013 16:06     Performed at [facility]   Culture     Final   Value:        BLOOD CULTURE RECEIVED NO GROWTH TO DATE CULTURE WILL BE HELD FOR 5 DAYS BEFORE ISSUING A FINAL NEGATIVE REPORT     Performed at [facility]   Report Status PENDING   Incomplete     Studies: Mr Foot Left Wo Contrast  04/18/2013   CLINICAL DATA:  Recent great toe amputation. Surgical wound infection.  EXAM: MRI OF THE LEFT FOREFOOT WITHOUT CONTRAST  TECHNIQUE: Multiplanar, multisequence MR imaging was performed. No intravenous contrast was administered.  COMPARISON:  Radiograph dated 03/23/2013  FINDINGS: The great toe has been amputated. There is no bone destruction or edema in the first metatarsal. There is slight edema in the soft tissues adjacent to the head of the first metatarsal deep to the operative wound.  The other bones of the forefoot are essentially normal.  The adjacent muscles and tendons appear normal. No joint effusions. There is slight sclerosis and irregularity of the sesamoids at the head of the first metatarsal. This is not significant.  IMPRESSION: No evidence of abscess or osteomyelitis. Small amount of fluid adjacent to the head of the first metatarsal is consistent with normal postsurgical seroma.   Electronically Signed   By: Rozetta Nunnery M.D.   On: 04/18/2013 13:24    Scheduled Meds: . [facility] HOLD] colchicine  0.6 mg Oral Daily  . [[facility] HOLD] doxazosin  2 mg Oral QHS  . [facility] HOLD] doxercalciferol  1 mcg Intravenous Q  M,W,F-HD  . [[facility] HOLD] feeding supplement (NEPRO CARB STEADY)  237 mL Oral BID BM  . [[facility] HOLD] heparin  5,000 Units Subcutaneous 3 times per day  . [[facility] HOLD] insulin aspart  0-5 Units Subcutaneous QHS  . [[facility] HOLD] insulin aspart  0-9 Units Subcutaneous TID WC  . [facility] HOLD] levothyroxine  175 mcg Oral QAC breakfast  . [[facility] HOLD] multivitamin  1 tablet Oral QHS  . [facility] HOLD] nafcillin IV  2 g Intravenous 6 times per day  . [[facility] HOLD] nebivolol  5 mg Oral QHS  . [[facility] HOLD] sodium chloride  3 mL Intravenous Q12H  . [facility] HOLD] vancomycin  1,000 mg Intravenous Q M,W,F-HD   Continuous Infusions:    Charlynne Cousins  [facility] Pager [phone]. If 8PM-8AM, please contact night-coverage at [email or web address], password [email or web address] 04/20/2013, 11:49 AM  LOS: 4 days

## 2013-04-20 NOTE — CV Procedure (Signed)
   Transesophageal Echocardiogram: Indication: SBE Sedation: Versed: 5, Fentanyl: 25, Other: 0 ASA: 2, Airway: 2  Procedure:  The patient was moderately sedated with the above doses of versed and fentanyl.  Using digital technique an omniplane probe was advanced into the distal esophagus without incident. Transgastric imaging revealed normal LV function with no RWMA;s and no mural apical thrombus.. Moderate LVH with some basal prominence  Estimated ejection fraction was 65%.  Right sided cardiac chambers were normal with no evidence of pulmonary hypertension.  The pulmonary and tricuspid valves were structurally normal.  There was mild  TR The mitral valve was structurally normal with no mitral regurgitation.    The aortic valve was trileaflet with no AR/AS The aortic root was normal.    Imaging of the septum showed no ASD or VSD Bubble study was negative for shunt 2D and color flow confirmed no PFO  The LAE was well visualized in orthogonal views.  There was no spontaneous contrast and no thrombus.    The descending thoracic aorta had mild grade 1 mural aortic debris with no evidence of aneurysmal dilation or disection  Impression:  1)  No SBE normal AV,MV,TV,PV 2)  Moderate LVH  EF 65% no RWMA 3)  No ASD 4) No LAA thrombus 5)  Normal RA/RV 6)  No effuson 7)  No shunt 8)  Mild grade one non mobile aortic debris  Jenkins Rouge 04/20/2013 11:38 AM

## 2013-04-20 NOTE — H&P (View-Only) (Signed)
[place] for Infectious Disease    Date of Admission:  04/16/2013  Date of Consult:  04/19/2013  Reason for Consult: Staphylococcus aureus bacteremia and gangrenous foot Referring Physician: Dr Olevia Bowens   HPI: Jack [name] is an 68 y.o. male with past medical history significant for end-stage renal disease on hemodialysis via left sided fistula, diabetes peripheral vascular disease multiple myeloma who had developed gangrenous changes in his left foot. He is status post amputation of first toe on March 25. He was readmitted on the fourth for fevers pain redness at the surgical site and was started on IV vancomycin and Zosyn. He has not yet had surgery but then followed closely by vascular surgery who felt that his wound looked up virtually unchanged.  Unfortunately is now sick him to Staphylococcus aureus bacteremia with blood cultures positive from admission.  I'm going to narrow his antibiotics to vancomycin and nafcillin the targeted staph aureus.  Given the emergence of staph aureus bacteremia I think that we need to reconsider need for surgery at the operative site as the risk of under treating this surgically could compromise treatment of his bacteremia plus or minus endocarditis.  Forcefully he has no central line or hemodialysis catheter in his AV fistula site appears healthy and without evidence of infection.  He does report as is his wife some episodes of confusion making the concern that he could have endocarditis with possible septic emboli the brain only has no focal findings on exam neurologically.  We are picking antibiotics to optimize CNS penetration cover both MRSA and MSSA.  He'll need a transesophageal echocardiogram tomorrow to rule out endocarditis   Past Medical History  Diagnosis Date  . Hypertension   . Diabetes mellitus without complication   . Shortness of breath   . Pneumonia     2012  . Heart murmur   . Glaucoma   . Multiple myeloma,  without mention of having achieved remission 03/30/2012    Cytogenetic neg on 03/23/2012.  . End stage renal disease on dialysis   . Asthma   . Hyperparathyroidism, secondary renal   . Peripheral arterial disease     Past Surgical History  Procedure Laterality Date  . Thyroidectomy    . Cervical disc surgery    . Eye surgery      CATARACTS  . Insertion of dialysis catheter Right 03/19/2012    Procedure: INSERTION OF DIALYSIS CATHETER;  Surgeon: Mal Misty, MD;  Location: [facility];  Service: Vascular;  Laterality: Right;  Right Internal Jugular  . Av fistula placement Left 03/25/2012    Procedure: ARTERIOVENOUS (AV) FISTULA CREATION;  Surgeon: Mal Misty, MD;  Location: [facility] [place];  Service: Vascular;  Laterality: Left;  . Ligation of competing branches of arteriovenous fistula Left 05/08/2012    Procedure: LIGATION OF COMPETING BRANCHES OF ARTERIOVENOUS FISTULA;  Surgeon: Mal Misty, MD;  Location: [facility];  Service: Vascular;  Laterality: Left;  Ultrasound guided  . Cardiac catheterization      approx 30 years ago  . Amputation Right 11/10/2012    Procedure: AMPUTATION FIRST and SECOND TOES Right Foot;  Surgeon: Elam Dutch, MD;  Location: [facility] OR;  Service: Vascular;  Laterality: Right;  . Transmetatarsal amputation Left 12/16/2012    Procedure: TRANSMETATARSAL AMPUTATION AND VAC PLACEMENT;  Surgeon: Elam Dutch, MD;  Location: [place];  Service: Vascular;  Laterality: Left;  . Amputation Left 04/07/2013    Procedure: AMPUTATION DIGIT- LEFT 1ST TOE;  Surgeon: Mal Misty, MD;  Location: [place];  Service: Vascular;  Laterality: Left;  ergies:   Allergies  Allergen Reactions  . Lisinopril Cough  . Ivp Dye [Iodinated Diagnostic Agents] Hives  . Morphine And Related Other (See Comments)    Bradycardia states patient     Medications: I have reviewed patients current medications as documented in Epic Anti-infectives   Start     Dose/Rate Route Frequency Ordered Stop   04/19/13  1345  nafcillin 2 g in dextrose 5 % 50 mL IVPB     2 g 100 mL/hr over 30 Minutes Intravenous 6 times per day 04/19/13 1313     04/19/13 1200  vancomycin (VANCOCIN) IVPB 1000 mg/200 mL premix     1,000 mg 200 mL/hr over 60 Minutes Intravenous Every M-W-F (Hemodialysis) 04/17/13 0245     04/17/13 0200  vancomycin (VANCOCIN) 2,000 mg in sodium chloride 0.9 % 500 mL IVPB     2,000 mg 250 mL/hr over 120 Minutes Intravenous  Once 04/17/13 0154 04/17/13 0627   04/17/13 0200  piperacillin-tazobactam (ZOSYN) IVPB 2.25 g  Status:  Discontinued     2.25 g 100 mL/hr over 30 Minutes Intravenous 3 times per day 04/17/13 0154 04/19/13 1312      Social History:  reports that he quit smoking about 31 years ago. His smoking use included Cigarettes. He smoked 0.00 packs per day. He has never used smokeless tobacco. He reports that he does not drink alcohol or use illicit drugs.  Family History  Problem Relation Age of Onset  . Diabetes Mother   . Cancer Mother     bone   . Kidney disease Father     As in HPI and primary teams notes otherwise 12 point review of systems is negative  Blood pressure 136/72, pulse 66, temperature 100.1 F (37.8 C), temperature source Oral, resp. rate 18, height _0  (1.778 m), weight 185 lb 10 oz (84.2 kg), SpO2 98.00%. General: Alert and awake, oriented x3, not in any acute distress. HEENT: anicteric sclera, pupils reactive to light and accommodation, EOMI, oropharynx clear and without exudate CVS regular rate, normal r, faint murmur LLSB Chest: clear to auscultation bilaterally, no wheezing, rales or rhonchi Abdomen: Hyperactive bowel sounds soft nontender, nondistended, normal bowel sounds, Extremities: Skin: see pictures:  Amputation site with darkening skin, sutures in place:     AV fistula site clean:    Right TMA site is clean and dry:      Neuro: nonfocal, strength and sensation intact   Results for orders placed during the hospital encounter  of 04/16/13 (from the past 48 hour(s))  GLUCOSE, CAPILLARY     Status: Abnormal   Collection Time    04/17/13  3:50 PM      Result Value Ref Range   Glucose-Capillary 128 (*) 70 - 99 mg/dL  GLUCOSE, CAPILLARY     Status: Abnormal   Collection Time    04/17/13  8:55 PM      Result Value Ref Range   Glucose-Capillary 170 (*) 70 - 99 mg/dL  GLUCOSE, CAPILLARY     Status: Abnormal   Collection Time    04/18/13  8:47 AM      Result Value Ref Range   Glucose-Capillary 137 (*) 70 - 99 mg/dL  GLUCOSE, CAPILLARY     Status: Abnormal   Collection Time    04/18/13  1:29 PM      Result Value Ref Range   Glucose-Capillary 144 (*)  70 - 99 mg/dL  GLUCOSE, CAPILLARY     Status: Abnormal   Collection Time    04/18/13  3:31 PM      Result Value Ref Range   Glucose-Capillary 132 (*) 70 - 99 mg/dL  GLUCOSE, CAPILLARY     Status: Abnormal   Collection Time    04/18/13  9:43 PM      Result Value Ref Range   Glucose-Capillary 112 (*) 70 - 99 mg/dL  GLUCOSE, CAPILLARY     Status: Abnormal   Collection Time    04/19/13  7:34 AM      Result Value Ref Range   Glucose-Capillary 117 (*) 70 - 99 mg/dL  GLUCOSE, CAPILLARY     Status: Abnormal   Collection Time    04/19/13 11:28 AM      Result Value Ref Range   Glucose-Capillary 112 (*) 70 - 99 mg/dL      Component Value Date/Time   SDES BLOOD RIGHT HAND 04/17/2013 0550   SPECREQUEST BOTTLES DRAWN AEROBIC AND ANAEROBIC 10CC BLUE,5CC RED 04/17/2013 0550   CULT  Value: STAPHYLOCOCCUS AUREUS Note: Gram Stain Report Called to,Read Back By and Verified With: Earleen Reaper RN on 04/18/13 at 05:50 by Rise Mu Performed at [facility] 04/17/2013 0550   REPTSTATUS PENDING 04/17/2013 0550   Mr Foot Left Wo Contrast  04/18/2013   CLINICAL DATA:  Recent great toe amputation. Surgical wound infection.  EXAM: MRI OF THE LEFT FOREFOOT WITHOUT CONTRAST  TECHNIQUE: Multiplanar, multisequence MR imaging was performed. No intravenous contrast was administered.   COMPARISON:  Radiograph dated 03/23/2013  FINDINGS: The great toe has been amputated. There is no bone destruction or edema in the first metatarsal. There is slight edema in the soft tissues adjacent to the head of the first metatarsal deep to the operative wound.  The other bones of the forefoot are essentially normal.  The adjacent muscles and tendons appear normal. No joint effusions. There is slight sclerosis and irregularity of the sesamoids at the head of the first metatarsal. This is not significant.  IMPRESSION: No evidence of abscess or osteomyelitis. Small amount of fluid adjacent to the head of the first metatarsal is consistent with normal postsurgical seroma.   Electronically Signed   By: Rozetta Nunnery M.D.   On: 04/18/2013 13:24   Mr Ankle Left  Wo Contrast  04/17/2013   CLINICAL DATA:  Fever, pain, swelling, and redness at the site of amputation of the left great toe. Elevated white blood count. Wound infection.  EXAM: MRI OF THE LEFT ANKLE WITHOUT CONTRAST  TECHNIQUE: Multiplanar, multisequence MR imaging of the ankle was performed. No intravenous contrast was administered.  COMPARISON:  Radiographs dated 03/23/2013  FINDINGS: There is circumferential edema in the subcutaneous tissues of the ankle and on the dorsum of the foot, nonspecific.  TENDONS  Peroneal: Normal.  Posteromedial: Normal.  Anterior: Normal.  Achilles: Normal.  Plantar Fascia: No acute abnormality. Chronic degenerative changes in the origin of the medial band of the plantar fascia.  LIGAMENTS  Lateral: Normal.  Medial: Normal.  .  CARTILAGE  Ankle Joint: Normal.  Subtalar Joints/Sinus Tarsi: Normal.  Bones: Minimal dorsal spurring on the distal talus. Minimal posterior spurring on the distal tibia.  IMPRESSION: No evidence of osteomyelitis or abscess. Edema in the subcutaneous tissues around the ankle and dorsum of the hindfoot, nonspecific.   Electronically Signed   By: Rozetta Nunnery M.D.   On: 04/17/2013 15:23     Recent  Results (from the past 720 hour(s))  SURGICAL PCR SCREEN     Status: None   Collection Time    04/17/13  5:10 AM      Result Value Ref Range Status   MRSA, PCR NEGATIVE  NEGATIVE Final   Staphylococcus aureus NEGATIVE  NEGATIVE Final   Comment:            The Xpert SA Assay (FDA     approved for NASAL specimens     in patients over 78 years of age),     is one component of     a comprehensive surveillance     program.  Test performance has     been validated by [employer] for patients greater     than or equal to 61 year old.     It is not intended     to diagnose infection nor to     guide or monitor treatment.  CULTURE, BLOOD (ROUTINE X 2)     Status: None   Collection Time    04/17/13  5:48 AM      Result Value Ref Range Status   Specimen Description BLOOD RIGHT HAND   Final   Special Requests BOTTLES DRAWN AEROBIC ONLY 5CC   Final   Culture  Setup Time     Final   Value: 04/17/2013 12:44     Performed at [facility]   Culture     Final   Value: GRAM POSITIVE COCCI IN CLUSTERS     Note: Gram Stain Report Called to,Read Back By and Verified With: ASHLEY LARSON 04/18/12 @ 10:45AM BY RUSCOE A.     Performed at [facility]   Report Status PENDING   Incomplete  CULTURE, BLOOD (ROUTINE X 2)     Status: None   Collection Time    04/17/13  5:50 AM      Result Value Ref Range Status   Specimen Description BLOOD RIGHT HAND   Final   Special Requests     Final   Value: BOTTLES DRAWN AEROBIC AND ANAEROBIC 10CC BLUE,5CC RED   Culture  Setup Time     Final   Value: 04/17/2013 12:44     Performed at [facility]   Culture     Final   Value: STAPHYLOCOCCUS AUREUS     Note: Gram Stain Report Called to,Read Back By and Verified With: Earleen Reaper RN on 04/18/13 at 05:50 by Rise Mu     Performed at [facility]   Report Status PENDING   Incomplete     Impression/Recommendation  Active Problems:   Essential hypertension, benign    Diabetes mellitus type 1, controlled, with complications   Wound infection after surgery   Fever, unspecified   Jack [name] is a 68 y.o. male with  Multiple myeloma, DM, ESRD on HD via AV fistula with gangrrene in right foot sp 1st amputation readmitted for infection at that site on abx and observation but NOW WITH SAB  #1      [place] Antimicrobial Management Team Staphylococcus aureus bacteremia   Staphylococcus aureus bacteremia (SAB) is associated with a high rate of complications and mortality.  Specific aspects of clinical management are critical to optimizing the outcome of patients with SAB.  Therefore, the [facility] Antimicrobial Management Team [facility]) has initiated an intervention aimed at improving the management of SAB at [facility].  To do so, Infectious Diseases physicians are  providing an evidence-based consult for the management of all patients with SAB.     Yes No Comments  Perform follow-up blood cultures (even if the patient is afebrile) to ensure clearance of bacteremia _0  _1    Remove vascular catheter and obtain follow-up blood cultures after the removal of the catheter _2  _3  No underlying today the fistula appears clean   Perform echocardiography to evaluate for endocarditis (transthoracic ECHO is 40-50% sensitive, TEE is > 90% sensitive) _4  _5  Please keep in mind, that neither test can definitively EXCLUDE endocarditis, and that should clinical suspicion remain high for endocarditis the patient should then still be treated with an "endocarditis" duration of therapy = 6 weeks  Will page Cards master for TEE tomorrow  Consult electrophysiologist to evaluate implanted cardiac device (pacemaker, ICD) _6  _7  NA  Ensure source control _8  _9  Have all abscesses been drained effectively? Have deep seeded infections (septic joints or osteomyelitis) had appropriate surgical debridement?  I REALLY FEEL STRONGLY THAT PRESENCE OF SAB SHOULD FORCE [place] TO RECONSIDER NEED  FOR RE-EXPLORATION OR MORE PROXIMAL AMPUTATION IN FOOT AS CONSEQUENCES OF FAILING TO CONTROL HIS BACTEREMIA WITH EVEN PROTRACTED IV ABX COULD MEAN RECURRENCE, INFECTION , REINFECTION OF HEART VALVES, AV FISTULA  Investigate for "metastatic" sites of infection _10  _11  Does the patient have ANY symptom or physical exam finding that would suggest a deeper infection (back or neck pain that may be suggestive of vertebral osteomyelitis or epidural abscess, muscle pain that could be a symptom of pyomyositis)?  Keep in mind that for deep seeded infections MRI imaging with contrast is preferred rather than other often insensitive tests such as plain will likely get an MRI of the brain      x-rays, especially early in a patient's presentation.  Change antibiotic therapy to nafcillin and vancomycin  _12  _13  Beta-lactam antibiotics are preferred for MSSA due to higher cure rates.   If on Vancomycin, goal trough should be 15 - 20 mcg/mL  Estimated duration of IV antibiotic therapy: 6-8 WEEKS  _14  _15  Consult case management for probably prolonged outpatient IV antibiotic therapy   #2 Amputation site: see above, I think presence of SAB should make [place] err on the side of overly aggressive therapy with re to his amputation site. Undertreatment surgically  Could have much more dire consequences. Will discuss further with [facility] as well.  I spent greater than 45 minutes with the patient including greater than 50% of time in face to face counsel of the patient and his wife and in coordination of their care.  #3 Screening: check HIV and hep c   04/19/2013, 1:54 PM   Thank you so much for this interesting consult  [place] for [place] [phone] (pager) [phone] (office) 04/19/2013, 1:54 PM  [place] 04/19/2013, 1:54 PM

## 2013-04-20 NOTE — Progress Notes (Signed)
  Echocardiogram 2D Echocardiogram has been performed.  Jack [name] 04/20/2013, 9:35 AM

## 2013-04-20 NOTE — Progress Notes (Signed)
ANTIBIOTIC CONSULT NOTE   Pharmacy Consult for Vancomycin Indication: Wound infection , SA bacteremia  Allergies  Allergen Reactions  . Lisinopril Cough  . Ivp Dye [Iodinated Diagnostic Agents] Hives  . Morphine And Related Other (See Comments)    Bradycardia states patient    Patient Measurements: Height: _0  (177.8 cm) Weight: 180 lb 5.4 oz (81.8 kg) IBW/kg (Calculated) : 73  Vital Signs: Temp: 98.4 F (36.9 C) (04/07 1350) Temp src: Oral (04/07 1350) BP: 131/63 mmHg (04/07 1350) Pulse Rate: 57 (04/07 1350)  Labs:  Recent Labs  04/19/13 1310 04/19/13 1501  WBC 10.9* 10.2  HGB 9.8* 10.4*  PLT 210 189  CREATININE  --  9.47*   Estimated Creatinine Clearance: 7.6 ml/min (by C-G formula based on Cr of 9.47).  Medical History: Past Medical History  Diagnosis Date  . Hypertension   . Diabetes mellitus without complication   . Shortness of breath   . Pneumonia     2012  . Heart murmur   . Glaucoma   . Multiple myeloma, without mention of having achieved remission 03/30/2012    Cytogenetic neg on 03/23/2012.  . End stage renal disease on dialysis   . Asthma   . Hyperparathyroidism, secondary renal   . Peripheral arterial disease    Assessment: 67 y/o M s/p first toe amputation on 3/25 to start vanco.  BC were positive for MRSA and ID was consulted.  ID dced zosyn and start nafcillin for MRSA and MSSA coverage. TEE showed no evidence of endocarditis. Pt has ESRD on HD MWF.  Goal of Therapy:  Pre HD vanc level 20-25 mg/L  Plan:  -Vancomycin 1000 mg IV qHD MWF- -f/u clinical course -Drug levels as indicated  Jack [name] 04/20/2013,3:09 PM

## 2013-04-20 NOTE — Progress Notes (Signed)
Subjective:  Awoke from sleep, no current complaints, TEE this afternoon  Objective: Vital signs in last 24 hours: Temp:  [98.2 F (36.8 C)-100.6 F (38.1 C)] 99.7 F (37.6 C) (04/07 0553) Pulse Rate:  [62-74] 62 (04/07 0553) Resp:  [16-23] 18 (04/07 0553) BP: (136-180)/(57-80) 141/59 mmHg (04/07 0553) SpO2:  [92 %-100 %] 100 % (04/07 0553) Weight:  [81.8 kg (180 lb 5.4 oz)-84.2 kg (185 lb 10 oz)] 81.8 kg (180 lb 5.4 oz) (04/06 1907) Weight change: 0 kg (0 lb)  Intake/Output from previous day: 04/06 0701 - 04/07 0700 In: 0  Out: 2470  Intake/Output this shift: Total I/O In: -  Out: 2470 [Other:2470]  Lab Results:  Recent Labs  04/19/13 1310 04/19/13 1501  WBC 10.9* 10.2  HGB 9.8* 10.4*  HCT 31.4* 33.0*  PLT 210 189   BMET:  Recent Labs  04/19/13 1501  NA 141  K 4.0  CL 92*  CO2 24  GLUCOSE 135*  BUN 54*  CREATININE 9.47*  CALCIUM 9.1  ALBUMIN 2.2*   No results found for this basename: PTH,  in the last 72 hours Iron Studies: No results found for this basename: IRON, TIBC, TRANSFERRIN, FERRITIN,  in the last 72 hours  Studies/Results: No results found.  EXAM: General appearance:  Alert, in no apparent distress Resp:  CTA without rales, rhonchi, or wheezes Cardio:  RRR with Gr II/VI systolic murmur, no rub GI:  + BS, soft and nontender Extremities:  No edema, R TMA well healed; L 1st toe TMA intact, but with proximal purple discoloration across forefoot Access:  AVF @ LUA with + bruit  Dialysis: [facility] 4h  400/800   85.5kg   Heparin 8700   LUA AVF Hectorol 1   EPO none   Assessment/Plan: 1. Recent R great toe amp (3/25 ) / possible post-op wound infection / MSSA bacteremia -  on Vancomycin & Nafcillin per ID, TEE pending today to rule out endocarditis.  2. ESRD - HD on MWF @ [place], K 4.  Next HD tomorrow.  3. HTN/Volume - BP 141/59 on Doxazosin 2 mg qhs, Bystolic 5 mg qhs; wt 58.8 kg s/p net UF 2.5 L yesterday, below EDW.  4. Anemia - Hgb 10.4,  stable.  5. Sec HPT - Ca 9.1 (10.5 corrected), Hectorol 1 mcg, no binders. Use 2Ca bath. 6. Nutrition - Alb 2.2, carb-mod renal diet, multivitamin, supplement.  7. DM - insulin per primary.     LOS: 4 days   LYLES,CHARLES 04/20/2013,6:41 AM  Pt seen, examined, agree w assess/plan as above with additions as indicated. For TEE today.  On double AB coverage for SAB for CNS penetration per ID.  Fevers are coming down. Plan HD tomorrow.  [name] MD pager [phone]    cell [phone] 04/20/2013, 1:37 PM

## 2013-04-20 NOTE — Progress Notes (Signed)
Pt  To recovery from procedure room, noted to be extremely diaphoretic, unresponsive. Dr Johnsie Cancel notified and order obtained to give Romazicon. Med given with immediate change in responsiveness. bp 121/64. Opens eyes and respnds verbally,

## 2013-04-20 NOTE — Progress Notes (Signed)
  Echocardiogram Echocardiogram Transesophageal has been performed.  ARTRELL, LAWLESS 04/20/2013, 11:50 AM

## 2013-04-21 ENCOUNTER — Encounter (HOSPITAL_COMMUNITY): Payer: Self-pay | Admitting: Cardiovascular Disease

## 2013-04-21 DIAGNOSIS — I96 Gangrene, not elsewhere classified: Secondary | ICD-10-CM

## 2013-04-21 DIAGNOSIS — E1059 Type 1 diabetes mellitus with other circulatory complications: Secondary | ICD-10-CM

## 2013-04-21 LAB — RENAL FUNCTION PANEL
Albumin: 2.2 g/dL — ABNORMAL LOW (ref 3.5–5.2)
BUN: 44 mg/dL — ABNORMAL HIGH (ref 6–23)
CALCIUM: 9.2 mg/dL (ref 8.4–10.5)
CHLORIDE: 95 meq/L — AB (ref 96–112)
CO2: 25 mEq/L (ref 19–32)
Creatinine, Ser: 7.84 mg/dL — ABNORMAL HIGH (ref 0.50–1.35)
GFR calc Af Amer: 7 mL/min — ABNORMAL LOW (ref >90)
GFR calc non Af Amer: 6 mL/min — ABNORMAL LOW (ref >90)
GLUCOSE: 117 mg/dL — AB (ref 70–99)
Phosphorus: 3.6 mg/dL (ref 2.3–4.6)
Potassium: 3.3 mEq/L — ABNORMAL LOW (ref 3.7–5.3)
SODIUM: 138 meq/L (ref 137–147)

## 2013-04-21 LAB — CBC
HCT: 34.5 % — ABNORMAL LOW (ref 39.0–52.0)
HEMOGLOBIN: 10.9 g/dL — AB (ref 13.0–17.0)
MCH: 26.5 pg (ref 26.0–34.0)
MCHC: 31.6 g/dL (ref 30.0–36.0)
MCV: 83.7 fL (ref 78.0–100.0)
Platelets: 241 10*3/uL (ref 150–400)
RBC: 4.12 MIL/uL — AB (ref 4.22–5.81)
RDW: 14.8 % (ref 11.5–15.5)
WBC: 10.8 10*3/uL — AB (ref 4.0–10.5)

## 2013-04-21 LAB — GLUCOSE, CAPILLARY
GLUCOSE-CAPILLARY: 132 mg/dL — AB (ref 70–99)
Glucose-Capillary: 141 mg/dL — ABNORMAL HIGH (ref 70–99)
Glucose-Capillary: 151 mg/dL — ABNORMAL HIGH (ref 70–99)
Glucose-Capillary: 189 mg/dL — ABNORMAL HIGH (ref 70–99)

## 2013-04-21 LAB — HIV-1 RNA QUANT-NO REFLEX-BLD: HIV 1 RNA Quant: <20 copies/mL (ref <20)

## 2013-04-21 MED ORDER — DEXTROSE 5 % IV SOLN
1.5000 g | INTRAVENOUS | Status: AC
  Administered 2013-04-22: 1.5 g via INTRAVENOUS
  Filled 2013-04-21: qty 1.5

## 2013-04-21 MED ORDER — DOXERCALCIFEROL 4 MCG/2ML IV SOLN
INTRAVENOUS | Status: AC
  Filled 2013-04-21: qty 2

## 2013-04-21 NOTE — Progress Notes (Signed)
[place] [facility]    Subjective: No new complaints   Antibiotics:  Anti-infectives   Start     Dose/Rate Route Frequency Ordered Stop   04/22/13 0600  cefUROXime (ZINACEF) 1.5 g in dextrose 5 % 50 mL IVPB     1.5 g 100 mL/hr over 30 Minutes Intravenous On call to O.R. 04/21/13 1626 04/23/13 0559   04/19/13 1345  nafcillin 2 g in dextrose 5 % 50 mL IVPB  Status:  Discontinued     2 g 100 mL/hr over 30 Minutes Intravenous 6 times per day 04/19/13 1313 04/20/13 1518   04/19/13 1200  vancomycin (VANCOCIN) IVPB 1000 mg/200 mL premix     1,000 mg 200 mL/hr over 60 Minutes Intravenous Every M-W-F (Hemodialysis) 04/17/13 0245     04/17/13 0200  vancomycin (VANCOCIN) 2,000 mg in sodium chloride 0.9 % 500 mL IVPB     2,000 mg 250 mL/hr over 120 Minutes Intravenous  Once 04/17/13 0154 04/17/13 0627   04/17/13 0200  piperacillin-tazobactam (ZOSYN) IVPB 2.25 g  Status:  Discontinued     2.25 g 100 mL/hr over 30 Minutes Intravenous 3 times per day 04/17/13 0154 04/19/13 1312      Medications: Scheduled Meds: . [START ON 04/22/2013] cefUROXime (ZINACEF)  IV  1.5 g Intravenous On Call to OR  . colchicine  0.6 mg Oral Daily  . doxazosin  2 mg Oral QHS  . doxercalciferol      . doxercalciferol  1 mcg Intravenous Q M,W,F-HD  . feeding supplement (NEPRO CARB STEADY)  237 mL Oral BID BM  . heparin  5,000 Units Subcutaneous 3 times per day  . insulin aspart  0-5 Units Subcutaneous QHS  . insulin aspart  0-9 Units Subcutaneous TID WC  . levothyroxine  175 mcg Oral QAC breakfast  . multivitamin  1 tablet Oral QHS  . nebivolol  5 mg Oral QHS  . sodium chloride  3 mL Intravenous Q12H  . vancomycin  1,000 mg Intravenous Q M,W,F-HD   Continuous Infusions:  PRN Meds:.sodium chloride, sodium chloride, sodium chloride, sodium chloride, sodium chloride, acetaminophen, acetaminophen, feeding supplement (NEPRO CARB STEADY), feeding supplement (NEPRO CARB STEADY), heparin,  heparin, heparin, lidocaine (PF), lidocaine (PF), lidocaine-prilocaine, lidocaine-prilocaine, ondansetron (ZOFRAN) IV, ondansetron, oxyCODONE, pentafluoroprop-tetrafluoroeth, pentafluoroprop-tetrafluoroeth, sodium chloride    Objective: Weight change:   Intake/Output Summary (Last 24 hours) at 04/21/13 1736 Last data filed at 04/21/13 1300  Gross per 24 hour  Intake    480 ml  Output      0 ml  Net    480 ml   Blood pressure 146/72, pulse 64, temperature 98.8 F (37.1 C), temperature source Oral, resp. rate 18, height '5\' 10"'  (1.778 m), weight 185 lb 6.5 oz (84.1 kg), SpO2 95.00%. Temp:  [98.8 F (37.1 C)-99.7 F (37.6 C)] 98.8 F (37.1 C) (04/08 1536) Pulse Rate:  [58-66] 64 (04/08 1730) Resp:  [17-20] 18 (04/08 1700) BP: (136-162)/(54-76) 146/72 mmHg (04/08 1730) SpO2:  [95 %-100 %] 95 % (04/08 1536) Weight:  [185 lb 6.5 oz (84.1 kg)] 185 lb 6.5 oz (84.1 kg) (04/08 1536)  Physical Exam: General: Alert and awake, oriented x3, not in any acute distress.  HEENT: anicteric sclera, pupils reactive to light and accommodation, EOMI, oropharynx clear and without exudate  CVS regular rate, normal r, faint murmur LLSB  Chest: clear to auscultation bilaterally, no wheezing, rales or rhonchi  Abdomen: Hyperactive bowel sounds soft nontender, nondistended, normal bowel sounds,  Extremities:  Skin: see pictures:  Amputation site with darkening skin, sutures in place:  See sequential pictures:  Picture 04/19/13:         Picture 04/20/13        Picture 04/21/13:        CBC:   Recent Labs  04/19/13 1501 04/21/13 1608  WBC 10.2 10.8*  HGB 10.4* 10.9*  HCT 33.0* 34.5*  NA 141 138  K 4.0 3.3*  CL 92* 95*  CO2 24 25  BUN 54* 44*  CREATININE 9.47* 7.84*    BMET  Recent Labs  04/19/13 1501 04/21/13 1608  NA 141 138  K 4.0 3.3*  CL 92* 95*  CO2 24 25  GLUCOSE 135* 117*  BUN 54* 44*  CREATININE 9.47* 7.84*  CALCIUM 9.1 9.2     Liver Panel   Recent  Labs  04/19/13 1501 04/21/13 1608  ALBUMIN 2.2* 2.2*       Sedimentation Rate No results found for this basename: ESRSEDRATE,  in the last 72 hours C-Reactive Protein No results found for this basename: CRP,  in the last 72 hours  Micro Results: Recent Results (from the past 240 hour(s))  SURGICAL PCR SCREEN     Status: None   Collection Time    04/17/13  5:10 AM      Result Value Ref Range Status   MRSA, PCR NEGATIVE  NEGATIVE Final   Staphylococcus aureus NEGATIVE  NEGATIVE Final   Comment:            The Xpert SA Assay (FDA     approved for NASAL specimens     in patients over 17 years of age),     is one component of     a comprehensive surveillance     program.  Test performance has     been validated by [employer] for patients greater     than or equal to 1 year old.     It is not intended     to diagnose infection nor to     guide or monitor treatment.  CULTURE, BLOOD (ROUTINE X 2)     Status: None   Collection Time    04/17/13  5:48 AM      Result Value Ref Range Status   Specimen Description BLOOD RIGHT HAND   Final   Special Requests BOTTLES DRAWN AEROBIC ONLY 5CC   Final   Culture  Setup Time     Final   Value: 04/17/2013 12:44     Performed at [facility]   Culture     Final   Value: METHICILLIN RESISTANT STAPHYLOCOCCUS AUREUS     Note: RIFAMPIN AND GENTAMICIN SHOULD NOT BE USED AS SINGLE DRUGS FOR TREATMENT OF STAPH INFECTIONS. This organism DOES NOT demonstrate inducible Clindamycin resistance in vitro. CRITICAL RESULT CALLED TO, READ BACK BY AND VERIFIED WITH: JAMIE COVINGTON      04/19/13 1437 BY SMITHERSJ     Note: Gram Stain Report Called to,Read Back By and Verified With: Virgilio Frees 04/18/12 @ 10:45AM BY RUSCOE A.     Performed at [facility]   Report Status 04/20/2013 FINAL   Final   Organism ID, Bacteria METHICILLIN RESISTANT STAPHYLOCOCCUS AUREUS   Final  CULTURE, BLOOD (ROUTINE X 2)     Status: None   Collection Time      04/17/13  5:50 AM      Result Value Ref Range Status   Specimen Description  BLOOD RIGHT HAND   Final   Special Requests     Final   Value: BOTTLES DRAWN AEROBIC AND ANAEROBIC 10CC BLUE,5CC RED   Culture  Setup Time     Final   Value: 04/17/2013 12:44     Performed at [facility]   Culture     Final   Value: STAPHYLOCOCCUS AUREUS     Note: SUSCEPTIBILITIES PERFORMED ON PREVIOUS CULTURE WITHIN THE LAST 5 DAYS.     Note: Gram Stain Report Called to,Read Back By and Verified With: Earleen Reaper RN on 04/18/13 at 05:50 by Rise Mu     Performed at [facility]   Report Status 04/20/2013 FINAL   Final  CULTURE, BLOOD (ROUTINE X 2)     Status: None   Collection Time    04/19/13  1:04 PM      Result Value Ref Range Status   Specimen Description BLOOD RIGHT HAND   Final   Special Requests BOTTLES DRAWN AEROBIC ONLY [facility]   Final   Culture  Setup Time     Final   Value: 04/19/2013 16:06     Performed at [facility]   Culture     Final   Value:        BLOOD CULTURE RECEIVED NO GROWTH TO DATE CULTURE WILL BE HELD FOR 5 DAYS BEFORE ISSUING A FINAL NEGATIVE REPORT     Performed at [facility]   Report Status PENDING   Incomplete  CULTURE, BLOOD (ROUTINE X 2)     Status: None   Collection Time    04/19/13  1:10 PM      Result Value Ref Range Status   Specimen Description BLOOD RIGHT ANTECUBITAL   Final   Special Requests BOTTLES DRAWN AEROBIC ONLY 5CC   Final   Culture  Setup Time     Final   Value: 04/19/2013 16:06     Performed at [facility]   Culture     Final   Value:        BLOOD CULTURE RECEIVED NO GROWTH TO DATE CULTURE WILL BE HELD FOR 5 DAYS BEFORE ISSUING A FINAL NEGATIVE REPORT     Performed at [facility]   Report Status PENDING   Incomplete    Studies/Results: No results found.    Assessment/Plan:  Active Problems:   Essential hypertension, benign   Diabetes mellitus type 1, controlled, with complications    Wound infection after surgery   Fever, unspecified    TRAVEN DAVIDS is a 68 y.o. male with  Multiple myeloma, DM, ESRD on HD via AV fistula with gangrrene in right foot sp 1st amputation readmitted for infection at that site on abx and observation but NOW WITH MRSA bacteremia.  #1  [place] [facility] Staphylococcus aureus bacteremia   Staphylococcus aureus bacteremia (SAB) is associated with a high rate of complications and mortality. Specific aspects of clinical management are critical to optimizing the outcome of patients with SAB. Therefore, the [facility] Antimicrobial Management Team [facility]) has initiated an intervention aimed at improving the management of SAB at [facility]. To do so, Infectious Diseases physicians are providing an evidence-based consult for the management of all patients with SAB.      Yes  No  Comments   Perform follow-up blood cultures (even if the patient is afebrile) to ensure clearance of bacteremia  '[X]'   '[ ]'   Performed on 04/19/13  Remove vascular catheter and  obtain follow-up blood cultures after the removal of the catheter  '[ ]'   '[ ]'   No line in place today the fistula appears clean   Perform echocardiography to evaluate for endocarditis (transthoracic ECHO is 40-50% sensitive, TEE is > 90% sensitive)  '[X]'   '[ ]'   TEE without vegetations  Consult electrophysiologist to evaluate implanted cardiac device (pacemaker, ICD)  '[ ]'   '[ ]'   NA   Ensure source control  '[ ]'   '[X]'   Have all abscesses been drained effectively?  Have deep seeded infections (septic joints or osteomyelitis) had appropriate surgical debridement?  I REALLY FEEL STRONGLY THAT PRESENCE OF SAB SHOULD FORCE [place] TO RECONSIDER NEED FOR RE-EXPLORATION OR MORE PROXIMAL AMPUTATION IN FOOT AS CONSEQUENCES OF FAILING TO CONTROL HIS BACTEREMIA WITH EVEN PROTRACTED IV ABX COULD MEAN RECURRENCE, INFECTION , REINFECTION OF HEART VALVES, AV FISTULA--  AND DR. Kellie Simmering TO TAKE TOT HE OR TOMORROW  FOR I AND D AND WOUND EXPLORATION--very much appreciate his surgical intervention   Investigate for "metastatic" sites of infection  '[X]'   '[ ]'   Does the patient have ANY symptom or physical exam finding that would suggest a deeper infection (back or neck pain that may be suggestive of vertebral osteomyelitis or epidural abscess, muscle pain that could be a symptom of pyomyositis)?  Keep in mind that for deep seeded infections MRI imaging with contrast is preferred rather than other often insensitive tests such as plain will consider MRI of the brain  x-rays, especially early in a patient's presentation.   Change antibiotic therapy to  vancomycin  '[X]'   '[ ]'   Beta-lactam antibiotics are preferred for MSSA due to higher cure rates.  If on Vancomycin, goal trough should be 15 - 20 mcg/mL   Estimated duration of IV antibiotic therapy: 8 WEEKS  '[X]'   '[ ]'   Consult case management for probably prolonged outpatient IV antibiotic therapy    #2 Amputation site: see above, [facility]VS TO TAKE TO OR TOMORROW   #3 Screening: check HIV and hep c  ARE BOTH NEGATIVE     LOS: 5 days   Truman Hayward 04/21/2013, 5:36 PM

## 2013-04-21 NOTE — Progress Notes (Signed)
Subjective:  No complaints, breathing well, denies pain  Objective: Vital signs in last 24 hours: Temp:  [98.4 F (36.9 C)-99.7 F (37.6 C)] 99.6 F (37.6 C) (04/08 0543) Pulse Rate:  [57-70] 66 (04/08 0543) Resp:  [16-25] 17 (04/08 0543) BP: (85-157)/(42-72) 157/68 mmHg (04/08 0543) SpO2:  [94 %-100 %] 100 % (04/08 0543) Weight change:   Intake/Output from previous day: 04/07 0701 - 04/08 0700 In: 600 [P.O.:600] Out: -    Lab Results:  Recent Labs  04/19/13 1310 04/19/13 1501  WBC 10.9* 10.2  HGB 9.8* 10.4*  HCT 31.4* 33.0*  PLT 210 189   BMET:  Recent Labs  04/19/13 1501  NA 141  K 4.0  CL 92*  CO2 24  GLUCOSE 135*  BUN 54*  CREATININE 9.47*  CALCIUM 9.1  ALBUMIN 2.2*   No results found for this basename: PTH,  in the last 72 hours Iron Studies: No results found for this basename: IRON, TIBC, TRANSFERRIN, FERRITIN,  in the last 72 hours  Studies/Results: No results found.  EXAM:  General appearance: Alert, in no apparent distress  Resp: CTA without rales, rhonchi, or wheezes  Cardio: RRR with Gr II/VI systolic murmur, no rub  GI: + BS, soft and nontender  Extremities: No edema, R TMA well healed; L 1st toe TMA intact, but with proximal purple discoloration across forefoot  Access: AVF @ LUA with + bruit   D[facility]: [facility]  4h 400/800 85.5kg Heparin 8700 LUA AVF  Hectorol 1 EPO none   Assessment/Plan: 1. R great toe amp (3/25 ) / possible post-op wound infection / MSSA bacteremia - on Vancomycin & Nafcillin per ID, TEE yesterday showed moderate LVH, EF 65%, no vegetation.  2. ESRD - HD on MWF @ [place], K 4. HD pending today.  3. HTN/Volume - BP 157/68 on Doxazosin 2 mg qhs, Bystolic 5 mg qhs; below EDW, no signs or symptoms of fluid overload..  4. Anemia - Hgb 10.4, stable.  5. Sec HPT - Ca 9.1 (10.5 corrected), Hectorol 1 mcg, no binders, 2Ca bath. 6. Nutrition - Alb 2.2, carb-mod renal diet, multivitamin, supplement.  7. DM - insulin per  primary.    LOS: 5 days   Ramiro Harvest 04/21/2013,7:14 AM  Pt seen, examined and agree w A/P as above.  [name] MD pager [phone]    cell [phone] 04/21/2013, 12:24 PM

## 2013-04-21 NOTE — Progress Notes (Signed)
Patient ID: Jack [name], male   DOB: 07/23/1945, 67 y.o.   MRN: [id number] Vascular Surgery Progress Note  Subjective: MRSA positive blood cultures-recent left first toe amputation on 04/07/2013  Objective:  Filed Vitals:   04/21/13 1544  BP: 161/74  Pulse: 61  Temp:   Resp: 19    General alert and oriented x3 Denies pain in left foot First toe amp site examined-dry gangrenous changes along the suture line and skin on the anterior aspect of the foot is dark and of poor quality but no definite cellulitis is noted. No fluctuance noted.   Labs:  Recent Labs Lab 04/16/13 2017 04/17/13 0550 04/19/13 1501  CREATININE 4.00* 4.92*  4.73* 9.47*    Recent Labs Lab 04/16/13 2017 04/17/13 0550 04/19/13 1501  NA 138 137 141  K 3.1* 3.4* 4.0  CL 93* 91* 92*  CO2 30 26 24  BUN 18 21 54*  CREATININE 4.00* 4.92*  4.73* 9.47*  GLUCOSE 230* 150* 135*  CALCIUM 8.6 8.9 9.1    Recent Labs Lab 04/17/13 0550 04/19/13 1310 04/19/13 1501  WBC 14.8*  14.7* 10.9* 10.2  HGB 11.2*  11.1* 9.8* 10.4*  HCT 36.2*  36.2* 31.4* 33.0*  PLT 207  217 210 189    Recent Labs Lab 04/17/13 0550  INR 1.31    I/O last 3 completed shifts: In: 600 [P.O.:600] Out: 2470 [Other:2470]  Imaging: No results found.  Assessment/Plan:   LOS: 5 days  s/p Procedure(s): TRANSESOPHAGEAL ECHOCARDIOGRAM (TEE)  Patient now 2 weeks post left first toe amputation with known severe tibial disease in disadvantage runoff.  MRSA positive blood cultures WBC-down to 10,000 and patient currently afebrile  Plan incision and drainage of first toe amputation site in the OR tomorrow and will pack wound open and debride as necessary Discuss with patient and he is agreeable   James Lawson, MD 04/21/2013 4:15 PM           

## 2013-04-21 NOTE — Progress Notes (Signed)
Darkened area noted on patient bottom measuring 4.5-5 cm that does not blanche.  MD Dr. Aileen Fass notified.  Barrier cream placed on bottom.  Patient placed on side, continue q2h turns

## 2013-04-21 NOTE — Anesthesia Preprocedure Evaluation (Addendum)
Anesthesia Evaluation  Patient identified by MRN, date of birth, ID band Patient awake    Reviewed: Allergy & Precautions, H&P , NPO status , Patient's Chart, lab work & pertinent test results  History of Anesthesia Complications (+) history of anesthetic complications  Airway Mallampati: I  Neck ROM: Full    Dental  (+) Edentulous Upper, Edentulous Lower, Dental Advisory Given   Pulmonary former smoker,    Pulmonary exam normal       Cardiovascular hypertension, + Peripheral Vascular Disease Rhythm:Regular Rate:Normal     Neuro/Psych    GI/Hepatic   Endo/Other  diabetesHypothyroidism   Renal/GU ESRF and DialysisRenal disease     Musculoskeletal   Abdominal   Peds  Hematology  (+) anemia ,   Anesthesia Other Findings   Reproductive/Obstetrics                          Anesthesia Physical Anesthesia Plan  ASA: III  Anesthesia Plan: General   Post-op Pain Management:    Induction: Intravenous  Airway Management Planned: LMA  Additional Equipment:   Intra-op Plan:   Post-operative Plan: Extubation in OR  Informed Consent: I have reviewed the patients History and Physical, chart, labs and discussed the procedure including the risks, benefits and alternatives for the proposed anesthesia with the patient or authorized representative who has indicated his/her understanding and acceptance.   Dental advisory given  Plan Discussed with: CRNA, Anesthesiologist and Surgeon  Anesthesia Plan Comments:         Anesthesia Quick Evaluation

## 2013-04-21 NOTE — Progress Notes (Addendum)
[facility] PROGRESS NOTE Interim History: [name]. He has a very complex medical history with diabetic foot infection. He was found to have gangrene of his left great toe. 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 He underwent amputation of his left great toe by Dr. Kellie Simmering on 04/07/2013   Assessment/Plan: Fever, due to bacteremia: - Started on Vanc and zosyn on 4.4.2015.d/c zosyn on 4.6.2015. - has defervesce, MRI No evidence of abscess or osteomyelitis, amount of fluid adjacent to the head of the first metatarsal  - BC 2/2 grew MRSA. Has remained afebrile. - TEE 4.7.2015 showed no SBE, med [place] 65%, repeated blood cultures. - ID onboard awaiting recommendation.  Diabetes mellitus type 1, controlled, with complications - Controlled. - cont SSI.  Essential hypertension, benign - stable cont home emds.  ESRD: - per renal for HD M,W,F.  Anemia of chronic disease; - per renal.  Code Status: full Family Communication: daughters  Disposition Plan: inpatinet   Consultants:  Renal  Vascular  Procedures:  MRI  Antibiotics:  vanc zosyn   HPI/Subjective: no complains  Objective: Filed Vitals:   04/20/13 1800 04/20/13 2200 04/21/13 0543 04/21/13 1000  BP: 138/54 136/61 157/68 162/69  Pulse: 65 63 66 58  Temp: 98.8 F (37.1 C) 99.7 F (37.6 C) 99.6 F (37.6 C) 99.2 F (37.3 C)  TempSrc: Oral Oral Oral Oral  Resp: 20 18 17 18   Height:      Weight:      SpO2: 98% 100% 100% 98%    Intake/Output Summary (Last 24 hours) at 04/21/13 1255 Last data filed at 04/21/13 0900  Gross per 24 hour  Intake    840 ml  Output      0 ml  Net    840 ml   Filed Weights   04/18/13 2145 04/19/13 1447 04/19/13 1907  Weight: 84.2 kg (185 lb 10 oz) 84.2 kg (185 lb 10 oz) 81.8 kg (180 lb 5.4 oz)    Exam:  General: Alert, awake, oriented x3, in no acute distress.  HEENT: No bruits, no goiter.  Heart: Regular rate and  rhythm, without murmurs, rubs, gallops.  Lungs: Good air movement, clear Abdomen: Soft, nontender, nondistended, positive bowel sounds.   Data Reviewed: Basic Metabolic Panel:  Recent Labs Lab 04/16/13 2017 04/17/13 0550 04/19/13 1501  NA 138 137 141  K 3.1* 3.4* 4.0  CL 93* 91* 92*  CO2 30 26 24   GLUCOSE 230* 150* 135*  BUN 18 21 54*  CREATININE 4.00* 4.92*  4.73* 9.47*  CALCIUM 8.6 8.9 9.1  PHOS  --   --  5.7*   Liver Function Tests:  Recent Labs Lab 04/19/13 1501  ALBUMIN 2.2*   No results found for this basename: LIPASE, AMYLASE,  in the last 168 hours No results found for this basename: AMMONIA,  in the last 168 hours CBC:  Recent Labs Lab 04/16/13 2017 04/17/13 0550 04/19/13 1310 04/19/13 1501  WBC 14.0* 14.8*  14.7* 10.9* 10.2  NEUTROABS 12.3*  --  8.0*  --   HGB 11.0* 11.2*  11.1* 9.8* 10.4*  HCT 35.4* 36.2*  36.2* 31.4* 33.0*  MCV 86.8 86.6  86.8 85.3 85.3  PLT 197 207  217 210 189   Cardiac Enzymes: No results found for this basename: CKTOTAL, CKMB, CKMBINDEX, TROPONINI,  in the last 168 hours BNP (last 3 results) No results found for this basename: PROBNP,  in the last 8760 hours CBG:  Recent Labs Lab 04/20/13  1311 04/20/13 1652 04/20/13 2157 04/21/13 0744 04/21/13 1145  GLUCAP 144* 248* 141* 151* 189*    Recent Results (from the past 240 hour(s))  SURGICAL PCR SCREEN     Status: None   Collection Time    04/17/13  5:10 AM      Result Value Ref Range Status   MRSA, PCR NEGATIVE  NEGATIVE Final   Staphylococcus aureus NEGATIVE  NEGATIVE Final   Comment:            The Xpert SA Assay (FDA     approved for NASAL specimens     in patients over 11 years of age),     is one component of     a comprehensive surveillance     program.  Test performance has     been validated by [employer] for patients greater     than or equal to 6 year old.     It is not intended     to diagnose infection nor to     guide or monitor  treatment.  CULTURE, BLOOD (ROUTINE X 2)     Status: None   Collection Time    04/17/13  5:48 AM      Result Value Ref Range Status   Specimen Description BLOOD RIGHT HAND   Final   Special Requests BOTTLES DRAWN AEROBIC ONLY 5CC   Final   Culture  Setup Time     Final   Value: 04/17/2013 12:44     Performed at [facility]   Culture     Final   Value: METHICILLIN RESISTANT STAPHYLOCOCCUS AUREUS     Note: RIFAMPIN AND GENTAMICIN SHOULD NOT BE USED AS SINGLE DRUGS FOR TREATMENT OF STAPH INFECTIONS. This organism DOES NOT demonstrate inducible Clindamycin resistance in vitro. CRITICAL RESULT CALLED TO, READ BACK BY AND VERIFIED WITH: JAMIE COVINGTON      04/19/13 1437 BY SMITHERSJ     Note: Gram Stain Report Called to,Read Back By and Verified With: Virgilio Frees 04/18/12 @ 10:45AM BY RUSCOE A.     Performed at [facility]   Report Status 04/20/2013 FINAL   Final   Organism ID, Bacteria METHICILLIN RESISTANT STAPHYLOCOCCUS AUREUS   Final  CULTURE, BLOOD (ROUTINE X 2)     Status: None   Collection Time    04/17/13  5:50 AM      Result Value Ref Range Status   Specimen Description BLOOD RIGHT HAND   Final   Special Requests     Final   Value: BOTTLES DRAWN AEROBIC AND ANAEROBIC 10CC BLUE,5CC RED   Culture  Setup Time     Final   Value: 04/17/2013 12:44     Performed at [facility]   Culture     Final   Value: STAPHYLOCOCCUS AUREUS     Note: SUSCEPTIBILITIES PERFORMED ON PREVIOUS CULTURE WITHIN THE LAST 5 DAYS.     Note: Gram Stain Report Called to,Read Back By and Verified With: Earleen Reaper RN on 04/18/13 at 05:50 by Rise Mu     Performed at [facility]   Report Status 04/20/2013 FINAL   Final  CULTURE, BLOOD (ROUTINE X 2)     Status: None   Collection Time    04/19/13  1:04 PM      Result Value Ref Range Status   Specimen Description BLOOD RIGHT HAND   Final   Special Requests BOTTLES DRAWN AEROBIC ONLY [place]  Final   Culture  Setup Time      Final   Value: 04/19/2013 16:06     Performed at [facility]   Culture     Final   Value:        BLOOD CULTURE RECEIVED NO GROWTH TO DATE CULTURE WILL BE HELD FOR 5 DAYS BEFORE ISSUING A FINAL NEGATIVE REPORT     Performed at [facility]   Report Status PENDING   Incomplete  CULTURE, BLOOD (ROUTINE X 2)     Status: None   Collection Time    04/19/13  1:10 PM      Result Value Ref Range Status   Specimen Description BLOOD RIGHT ANTECUBITAL   Final   Special Requests BOTTLES DRAWN AEROBIC ONLY 5CC   Final   Culture  Setup Time     Final   Value: 04/19/2013 16:06     Performed at [facility]   Culture     Final   Value:        BLOOD CULTURE RECEIVED NO GROWTH TO DATE CULTURE WILL BE HELD FOR 5 DAYS BEFORE ISSUING A FINAL NEGATIVE REPORT     Performed at [facility]   Report Status PENDING   Incomplete     Studies: No results found.  Scheduled Meds: . colchicine  0.6 mg Oral Daily  . doxazosin  2 mg Oral QHS  . doxercalciferol  1 mcg Intravenous Q M,W,F-HD  . feeding supplement (NEPRO CARB STEADY)  237 mL Oral BID BM  . heparin  5,000 Units Subcutaneous 3 times per day  . insulin aspart  0-5 Units Subcutaneous QHS  . insulin aspart  0-9 Units Subcutaneous TID WC  . levothyroxine  175 mcg Oral QAC breakfast  . multivitamin  1 tablet Oral QHS  . nebivolol  5 mg Oral QHS  . sodium chloride  3 mL Intravenous Q12H  . vancomycin  1,000 mg Intravenous Q M,W,F-HD   Continuous Infusions:    [facility] Pager [phone]. If 8PM-8AM, please contact night-coverage at [email or web address], password [facility] 04/21/2013, 12:55 PM  LOS: 5 days

## 2013-04-22 ENCOUNTER — Encounter (HOSPITAL_COMMUNITY)
Admission: EM | Disposition: A | Discharge status: Home Health | Payer: Self-pay | Source: Home / Self Care | Attending: Internal Medicine

## 2013-04-22 ENCOUNTER — Encounter (HOSPITAL_COMMUNITY): Payer: Medicare Other | Admitting: Anesthesiology

## 2013-04-22 ENCOUNTER — Encounter (HOSPITAL_COMMUNITY): Payer: Self-pay | Admitting: Certified Registered Nurse Anesthetist

## 2013-04-22 ENCOUNTER — Inpatient Hospital Stay (HOSPITAL_COMMUNITY): Payer: Medicare Other | Admitting: Anesthesiology

## 2013-04-22 DIAGNOSIS — Z8614 Personal history of Methicillin resistant Staphylococcus aureus infection: Secondary | ICD-10-CM | POA: Diagnosis present

## 2013-04-22 DIAGNOSIS — I739 Peripheral vascular disease, unspecified: Secondary | ICD-10-CM

## 2013-04-22 DIAGNOSIS — E118 Type 2 diabetes mellitus with unspecified complications: Secondary | ICD-10-CM

## 2013-04-22 DIAGNOSIS — R7881 Bacteremia: Secondary | ICD-10-CM

## 2013-04-22 DIAGNOSIS — I96 Gangrene, not elsewhere classified: Secondary | ICD-10-CM

## 2013-04-22 DIAGNOSIS — B9562 Methicillin resistant Staphylococcus aureus infection as the cause of diseases classified elsewhere: Secondary | ICD-10-CM

## 2013-04-22 HISTORY — DX: Methicillin resistant Staphylococcus aureus infection as the cause of diseases classified elsewhere: B95.62

## 2013-04-22 HISTORY — DX: Bacteremia: R78.81

## 2013-04-22 HISTORY — PX: I&D EXTREMITY: SHX5045

## 2013-04-22 LAB — BASIC METABOLIC PANEL
BUN: 28 mg/dL — ABNORMAL HIGH (ref 6–23)
CALCIUM: 9.7 mg/dL (ref 8.4–10.5)
CO2: 23 mEq/L (ref 19–32)
Chloride: 93 mEq/L — ABNORMAL LOW (ref 96–112)
Creatinine, Ser: 5.75 mg/dL — ABNORMAL HIGH (ref 0.50–1.35)
GFR calc non Af Amer: 9 mL/min — ABNORMAL LOW (ref >90)
GFR, EST AFRICAN AMERICAN: 10 mL/min — AB (ref >90)
GLUCOSE: 128 mg/dL — AB (ref 70–99)
POTASSIUM: 3.8 meq/L (ref 3.7–5.3)
SODIUM: 138 meq/L (ref 137–147)

## 2013-04-22 LAB — PROTIME-INR
INR: 1.11 (ref 0.00–1.49)
Prothrombin Time: 14.1 seconds (ref 11.6–15.2)

## 2013-04-22 LAB — CBC
HCT: 39.1 % (ref 39.0–52.0)
HEMOGLOBIN: 12.1 g/dL — AB (ref 13.0–17.0)
MCH: 26.8 pg (ref 26.0–34.0)
MCHC: 30.9 g/dL (ref 30.0–36.0)
MCV: 86.5 fL (ref 78.0–100.0)
PLATELETS: 240 10*3/uL (ref 150–400)
RBC: 4.52 MIL/uL (ref 4.22–5.81)
RDW: 14.9 % (ref 11.5–15.5)
WBC: 12 10*3/uL — ABNORMAL HIGH (ref 4.0–10.5)

## 2013-04-22 LAB — GLUCOSE, CAPILLARY
GLUCOSE-CAPILLARY: 135 mg/dL — AB (ref 70–99)
Glucose-Capillary: 134 mg/dL — ABNORMAL HIGH (ref 70–99)
Glucose-Capillary: 173 mg/dL — ABNORMAL HIGH (ref 70–99)
Glucose-Capillary: 208 mg/dL — ABNORMAL HIGH (ref 70–99)

## 2013-04-22 SURGERY — IRRIGATION AND DEBRIDEMENT EXTREMITY
Anesthesia: General | Site: Foot | Laterality: Left

## 2013-04-22 MED ORDER — SODIUM CHLORIDE 0.9 % IV SOLN
INTRAVENOUS | Status: DC | PRN
  Administered 2013-04-22: 10:00:00 via INTRAVENOUS

## 2013-04-22 MED ORDER — ARTIFICIAL TEARS OP OINT
TOPICAL_OINTMENT | OPHTHALMIC | Status: AC
  Filled 2013-04-22: qty 3.5

## 2013-04-22 MED ORDER — MIDAZOLAM HCL 2 MG/2ML IJ SOLN: 1.0000 mg | INTRAMUSCULAR | Status: DC | PRN

## 2013-04-22 MED ORDER — FENTANYL CITRATE 0.05 MG/ML IJ SOLN
INTRAMUSCULAR | Status: DC | PRN
  Administered 2013-04-22: 50 ug via INTRAVENOUS

## 2013-04-22 MED ORDER — HYDROMORPHONE HCL PF 1 MG/ML IJ SOLN
0.5000 mg | INTRAMUSCULAR | Status: DC | PRN
  Administered 2013-04-30: 0.5 mg via INTRAVENOUS

## 2013-04-22 MED ORDER — ONDANSETRON HCL 4 MG/2ML IJ SOLN
INTRAMUSCULAR | Status: AC
  Filled 2013-04-22: qty 2

## 2013-04-22 MED ORDER — EPHEDRINE SULFATE 50 MG/ML IJ SOLN
INTRAMUSCULAR | Status: DC | PRN
  Administered 2013-04-22: 5 mg via INTRAVENOUS
  Administered 2013-04-22 (×2): 10 mg via INTRAVENOUS

## 2013-04-22 MED ORDER — LIDOCAINE HCL (CARDIAC) 20 MG/ML IV SOLN
INTRAVENOUS | Status: AC
  Filled 2013-04-22: qty 5

## 2013-04-22 MED ORDER — PROTAMINE SULFATE 10 MG/ML IV SOLN
INTRAVENOUS | Status: AC
  Filled 2013-04-22: qty 5

## 2013-04-22 MED ORDER — ONDANSETRON HCL 4 MG/2ML IJ SOLN
INTRAMUSCULAR | Status: DC | PRN
  Administered 2013-04-22: 4 mg via INTRAVENOUS

## 2013-04-22 MED ORDER — FENTANYL CITRATE 0.05 MG/ML IJ SOLN
INTRAMUSCULAR | Status: AC
  Filled 2013-04-22: qty 5

## 2013-04-22 MED ORDER — LIDOCAINE HCL 1 % IJ SOLN
INTRAMUSCULAR | Status: DC | PRN
  Administered 2013-04-22: 80 mg via INTRADERMAL

## 2013-04-22 MED ORDER — HEPARIN SODIUM (PORCINE) 1000 UNIT/ML IJ SOLN
INTRAMUSCULAR | Status: AC
  Filled 2013-04-22: qty 1

## 2013-04-22 MED ORDER — PROPOFOL 10 MG/ML IV BOLUS
INTRAVENOUS | Status: AC
  Filled 2013-04-22: qty 20

## 2013-04-22 MED ORDER — PROPOFOL 10 MG/ML IV BOLUS
INTRAVENOUS | Status: DC | PRN
  Administered 2013-04-22: 150 mg via INTRAVENOUS

## 2013-04-22 MED ORDER — EPHEDRINE SULFATE 50 MG/ML IJ SOLN
INTRAMUSCULAR | Status: AC
  Filled 2013-04-22: qty 1

## 2013-04-22 MED ORDER — SODIUM CHLORIDE 0.9 % IV SOLN
INTRAVENOUS | Status: DC
  Administered 2013-04-22: 10:00:00 via INTRAVENOUS
  Administered 2013-04-26: 35 mL/h via INTRAVENOUS

## 2013-04-22 MED ORDER — ARTIFICIAL TEARS OP OINT
TOPICAL_OINTMENT | OPHTHALMIC | Status: DC | PRN
  Administered 2013-04-22: 1 via OPHTHALMIC

## 2013-04-22 MED ORDER — FENTANYL CITRATE 0.05 MG/ML IJ SOLN: 50.0000 ug | INTRAMUSCULAR | Status: DC | PRN

## 2013-04-22 MED ORDER — 0.9 % SODIUM CHLORIDE (POUR BTL) OPTIME
TOPICAL | Status: DC | PRN
  Administered 2013-04-22: 1000 mL

## 2013-04-22 MED ORDER — SODIUM CHLORIDE 0.9 % IJ SOLN
INTRAMUSCULAR | Status: AC
  Filled 2013-04-22: qty 10

## 2013-04-22 SURGICAL SUPPLY — 29 items
BANDAGE ELASTIC 4 VELCRO ST LF (GAUZE/BANDAGES/DRESSINGS) ×3 IMPLANT
BANDAGE ELASTIC 6 VELCRO ST LF (GAUZE/BANDAGES/DRESSINGS) IMPLANT
BANDAGE GAUZE ELAST BULKY 4 IN (GAUZE/BANDAGES/DRESSINGS) IMPLANT
BNDG GAUZE ELAST 4 BULKY (GAUZE/BANDAGES/DRESSINGS) ×3 IMPLANT
CANISTER SUCTION 2500CC (MISCELLANEOUS) ×3 IMPLANT
COVER SURGICAL LIGHT HANDLE (MISCELLANEOUS) ×3 IMPLANT
ELECT REM PT RETURN 9FT ADLT (ELECTROSURGICAL) ×3
ELECTRODE REM PT RTRN 9FT ADLT (ELECTROSURGICAL) ×1 IMPLANT
GLOVE SS BIOGEL STRL SZ 7 (GLOVE) ×1 IMPLANT
GLOVE SUPERSENSE BIOGEL SZ 7 (GLOVE) ×2
GOWN STRL REUS W/ TWL LRG LVL3 (GOWN DISPOSABLE) ×3 IMPLANT
GOWN STRL REUS W/TWL LRG LVL3 (GOWN DISPOSABLE) ×6
KIT BASIN OR (CUSTOM PROCEDURE TRAY) ×3 IMPLANT
KIT ROOM TURNOVER OR (KITS) ×3 IMPLANT
NS IRRIG 1000ML POUR BTL (IV SOLUTION) ×3 IMPLANT
PACK CV ACCESS (CUSTOM PROCEDURE TRAY) IMPLANT
PACK GENERAL/GYN (CUSTOM PROCEDURE TRAY) IMPLANT
PACK UNIVERSAL I (CUSTOM PROCEDURE TRAY) IMPLANT
PAD ARMBOARD 7.5X6 YLW CONV (MISCELLANEOUS) ×6 IMPLANT
SPONGE GAUZE 4X4 12PLY (GAUZE/BANDAGES/DRESSINGS) ×3 IMPLANT
STAPLER VISISTAT 35W (STAPLE) IMPLANT
SUT ETHILON 3 0 PS 1 (SUTURE) IMPLANT
SUT VIC AB 2-0 CTX 36 (SUTURE) IMPLANT
SUT VIC AB 3-0 SH 27 (SUTURE)
SUT VIC AB 3-0 SH 27X BRD (SUTURE) IMPLANT
SUT VICRYL 4-0 PS2 18IN ABS (SUTURE) IMPLANT
TOWEL OR 17X24 6PK STRL BLUE (TOWEL DISPOSABLE) ×3 IMPLANT
TOWEL OR 17X26 10 PK STRL BLUE (TOWEL DISPOSABLE) ×3 IMPLANT
WATER STERILE IRR 1000ML POUR (IV SOLUTION) ×3 IMPLANT

## 2013-04-22 NOTE — Progress Notes (Signed)
Darkened spot seen on patient buttocks, no changes from yesterday.  Patient turned q2h.  Barrier cream applied to area.  Wound care consult ordered

## 2013-04-22 NOTE — Interval H&P Note (Signed)
History and Physical Interval Note:  04/22/2013 9:53 AM  Jack [name]  has presented today for surgery, with the diagnosis of Wound infection  The various methods of treatment have been discussed with the patient and family. After consideration of risks, benefits and other options for treatment, the patient has consented to  Procedure(s): IRRIGATION AND DEBRIDEMENT LEFT FIRST TOE AMPUTATION WOUND  (Left) as a surgical intervention .  The patient's history has been reviewed, patient examined, no change in status, stable for surgery.  I have reviewed the patient's chart and labs.  Questions were answered to the patient's satisfaction.     Mal Misty

## 2013-04-22 NOTE — Transfer of Care (Signed)
Immediate Anesthesia Transfer of Care Note  Patient: Jack [name]  Procedure(s) Performed: Procedure(s): IRRIGATION AND DEBRIDEMENT LEFT FIRST TOE AMPUTATION WOUND  (Left)  Patient Location: PACU  Anesthesia Type:General  Level of Consciousness: awake, alert , oriented and patient cooperative  Airway & Oxygen Therapy: Patient Spontanous Breathing and Patient connected to nasal cannula oxygen  Post-op Assessment: Report given to PACU RN and Post -op Vital signs reviewed and stable  Post vital signs: Reviewed and stable  Complications: No apparent anesthesia complications

## 2013-04-22 NOTE — Progress Notes (Signed)
[facility] for Infectious Disease     Subjective: No new complaints   Antibiotics:  Anti-infectives   Start     Dose/Rate Route Frequency Ordered Stop   04/22/13 0600  [MAR Hold]  cefUROXime (ZINACEF) 1.5 g in dextrose 5 % 50 mL IVPB     (On MAR Hold since 04/22/13 0904)   1.5 g 100 mL/hr over 30 Minutes Intravenous On call to O.R. 04/21/13 1626 04/22/13 1015   04/19/13 1345  nafcillin 2 g in dextrose 5 % 50 mL IVPB  Status:  Discontinued     2 g 100 mL/hr over 30 Minutes Intravenous 6 times per day 04/19/13 1313 04/20/13 1518   04/19/13 1200  vancomycin (VANCOCIN) IVPB 1000 mg/200 mL premix     1,000 mg 200 mL/hr over 60 Minutes Intravenous Every M-W-F (Hemodialysis) 04/17/13 0245     04/17/13 0200  vancomycin (VANCOCIN) 2,000 mg in sodium chloride 0.9 % 500 mL IVPB     2,000 mg 250 mL/hr over 120 Minutes Intravenous  Once 04/17/13 0154 04/17/13 0627   04/17/13 0200  piperacillin-tazobactam (ZOSYN) IVPB 2.25 g  Status:  Discontinued     2.25 g 100 mL/hr over 30 Minutes Intravenous 3 times per day 04/17/13 0154 04/19/13 1312      Medications: Scheduled Meds: . colchicine  0.6 mg Oral Daily  . doxazosin  2 mg Oral QHS  . doxercalciferol  1 mcg Intravenous Q M,W,F-HD  . feeding supplement (NEPRO CARB STEADY)  237 mL Oral BID BM  . heparin  5,000 Units Subcutaneous 3 times per day  . insulin aspart  0-5 Units Subcutaneous QHS  . insulin aspart  0-9 Units Subcutaneous TID WC  . levothyroxine  175 mcg Oral QAC breakfast  . multivitamin  1 tablet Oral QHS  . nebivolol  5 mg Oral QHS  . sodium chloride  3 mL Intravenous Q12H  . vancomycin  1,000 mg Intravenous Q M,W,F-HD   Continuous Infusions: . sodium chloride 20 mL/hr at 04/22/13 1002   PRN Meds:.sodium chloride, sodium chloride, sodium chloride, acetaminophen, acetaminophen, feeding supplement (NEPRO CARB STEADY), fentaNYL, heparin, heparin, HYDROmorphone (DILAUDID) injection, lidocaine (PF),  lidocaine-prilocaine, midazolam, ondansetron (ZOFRAN) IV, ondansetron, oxyCODONE, pentafluoroprop-tetrafluoroeth, sodium chloride    Objective: Weight change:   Intake/Output Summary (Last 24 hours) at 04/22/13 2057 Last data filed at 04/22/13 1300  Gross per 24 hour  Intake    300 ml  Output      0 ml  Net    300 ml   Blood pressure 132/51, pulse 68, temperature 99.4 F (37.4 C), temperature source Oral, resp. rate 18, height _0  (1.778 m), weight 176 lb 9.4 oz (80.1 kg), SpO2 100.00%. Temp:  [97.9 F (36.6 C)-99.4 F (37.4 C)] 99.4 F (37.4 C) (04/09 1700) Pulse Rate:  [64-75] 68 (04/09 1700) Resp:  [18-19] 18 (04/09 1700) BP: (122-160)/(51-72) 132/51 mmHg (04/09 1700) SpO2:  [96 %-100 %] 100 % (04/09 1700)  Physical Exam: General: Alert and awake, oriented x3, not in any acute distress.  HEENT: anicteric sclera, pupils reactive to light and accommodation, EOMI, oropharynx clear and without exudate  CVS regular rate, normal r, faint murmur LLSB  Chest: clear to auscultation bilaterally, no wheezing, rales or rhonchi  Abdomen: Hyperactive bowel sounds soft nontender, nondistended, normal bowel sounds,  Extremities: Skin: see pictures:  Amputation site with darkening skin, sutures in place:  See sequential pictures:  Picture 04/19/13:  Picture 04/20/13        Picture 04/21/13:     The wound appeared stable examined thru the bandage      CBC:   Recent Labs  04/21/13 1608 04/22/13 0725  WBC 10.8* 12.0*  HGB 10.9* 12.1*  HCT 34.5* 39.1  NA 138 138  K 3.3* 3.8  CL 95* 93*  CO2 25 23  BUN 44* 28*  CREATININE 7.84* 5.75*    BMET  Recent Labs  04/21/13 1608 04/22/13 0725  NA 138 138  K 3.3* 3.8  CL 95* 93*  CO2 25 23  GLUCOSE 117* 128*  BUN 44* 28*  CREATININE 7.84* 5.75*  CALCIUM 9.2 9.7     Liver Panel   Recent Labs  04/21/13 1608  ALBUMIN 2.2*       Sedimentation Rate No results found for this basename:  ESRSEDRATE,  in the last 72 hours C-Reactive Protein No results found for this basename: CRP,  in the last 72 hours  Micro Results: Recent Results (from the past 240 hour(s))  SURGICAL PCR SCREEN     Status: None   Collection Time    04/17/13  5:10 AM      Result Value Ref Range Status   MRSA, PCR NEGATIVE  NEGATIVE Final   Staphylococcus aureus NEGATIVE  NEGATIVE Final   Comment:            The Xpert SA Assay (FDA     approved for NASAL specimens     in patients over 19 years of age),     is one component of     a comprehensive surveillance     program.  Test performance has     been validated by [employer] for patients greater     than or equal to 55 year old.     It is not intended     to diagnose infection nor to     guide or monitor treatment.  CULTURE, BLOOD (ROUTINE X 2)     Status: None   Collection Time    04/17/13  5:48 AM      Result Value Ref Range Status   Specimen Description BLOOD RIGHT HAND   Final   Special Requests BOTTLES DRAWN AEROBIC ONLY 5CC   Final   Culture  Setup Time     Final   Value: 04/17/2013 12:44     Performed at [facility]   Culture     Final   Value: METHICILLIN RESISTANT STAPHYLOCOCCUS AUREUS     Note: RIFAMPIN AND GENTAMICIN SHOULD NOT BE USED AS SINGLE DRUGS FOR TREATMENT OF STAPH INFECTIONS. This organism DOES NOT demonstrate inducible Clindamycin resistance in vitro. CRITICAL RESULT CALLED TO, READ BACK BY AND VERIFIED WITH: JAMIE COVINGTON      04/19/13 1437 BY SMITHERSJ     Note: Gram Stain Report Called to,Read Back By and Verified With: Virgilio Frees 04/18/12 @ 10:45AM BY RUSCOE A.     Performed at [facility]   Report Status 04/20/2013 FINAL   Final   Organism ID, Bacteria METHICILLIN RESISTANT STAPHYLOCOCCUS AUREUS   Final  CULTURE, BLOOD (ROUTINE X 2)     Status: None   Collection Time    04/17/13  5:50 AM      Result Value Ref Range Status   Specimen Description BLOOD RIGHT HAND   Final   Special  Requests     Final   Value: BOTTLES DRAWN AEROBIC  AND ANAEROBIC 10CC BLUE,5CC RED   Culture  Setup Time     Final   Value: 04/17/2013 12:44     Performed at [facility]   Culture     Final   Value: STAPHYLOCOCCUS AUREUS     Note: SUSCEPTIBILITIES PERFORMED ON PREVIOUS CULTURE WITHIN THE LAST 5 DAYS.     Note: Gram Stain Report Called to,Read Back By and Verified With: Earleen Reaper RN on 04/18/13 at 05:50 by Rise Mu     Performed at [facility]   Report Status 04/20/2013 FINAL   Final  CULTURE, BLOOD (ROUTINE X 2)     Status: None   Collection Time    04/19/13  1:04 PM      Result Value Ref Range Status   Specimen Description BLOOD RIGHT HAND   Final   Special Requests BOTTLES DRAWN AEROBIC ONLY [facility]   Final   Culture  Setup Time     Final   Value: 04/19/2013 16:06     Performed at [facility]   Culture     Final   Value:        BLOOD CULTURE RECEIVED NO GROWTH TO DATE CULTURE WILL BE HELD FOR 5 DAYS BEFORE ISSUING A FINAL NEGATIVE REPORT     Performed at [facility]   Report Status PENDING   Incomplete  CULTURE, BLOOD (ROUTINE X 2)     Status: None   Collection Time    04/19/13  1:10 PM      Result Value Ref Range Status   Specimen Description BLOOD RIGHT ANTECUBITAL   Final   Special Requests BOTTLES DRAWN AEROBIC ONLY 5CC   Final   Culture  Setup Time     Final   Value: 04/19/2013 16:06     Performed at [facility]   Culture     Final   Value:        BLOOD CULTURE RECEIVED NO GROWTH TO DATE CULTURE WILL BE HELD FOR 5 DAYS BEFORE ISSUING A FINAL NEGATIVE REPORT     Performed at [facility]   Report Status PENDING   Incomplete  WOUND CULTURE     Status: None   Collection Time    04/22/13 10:37 AM      Result Value Ref Range Status   Specimen Description WOUND LEFT FOOT   Final   Special Requests LEFT FIRST TOE PT ON VANCO VANCO   Final   Gram Stain     Final   Value: FEW WBC PRESENT, PREDOMINANTLY PMN     FEW  SQUAMOUS EPITHELIAL CELLS PRESENT     MODERATE GRAM POSITIVE COCCI IN CLUSTERS     MODERATE GRAM POSITIVE RODS     Performed at [facility]   Culture PENDING   Incomplete   Report Status PENDING   Incomplete    Studies/Results: No results found.    Assessment/Plan:  Principal Problem:   MRSA bacteremia Active Problems:   Essential hypertension, benign   Diabetes mellitus type 1, controlled, with complications   Anemia in chronic kidney disease   End stage renal disease   Peripheral vascular disease, unspecified   Diabetes mellitus   Toe gangrene   Wound infection after surgery   Fever, unspecified    Jack [name] is a 68 y.o. male with  Multiple myeloma, DM, ESRD on HD via AV fistula with gangrrene in right foot sp 1st amputation readmitted for infection at that site  on abx and observation but NOW WITH MRSA bacteremia.  #1  [place] [facility] Staphylococcus aureus bacteremia   Staphylococcus aureus bacteremia (SAB) is associated with a high rate of complications and mortality. Specific aspects of clinical management are critical to optimizing the outcome of patients with SAB. Therefore, the [facility] Antimicrobial Management Team [facility]) has initiated an intervention aimed at improving the management of SAB at [facility]. To do so, Infectious Diseases physicians are providing an evidence-based consult for the management of all patients with SAB.      Yes  No  Comments   Perform follow-up blood cultures (even if the patient is afebrile) to ensure clearance of bacteremia  _0   _1   Performed on 04/19/13 NGTD  Remove vascular catheter and obtain follow-up blood cultures after the removal of the catheter  _2   _3   No line in place today the fistula appears clean   Perform echocardiography to evaluate for endocarditis (transthoracic ECHO is 40-50% sensitive, TEE is > 90% sensitive)  _4   _5   TEE without vegetations  Consult  electrophysiologist to evaluate implanted cardiac device (pacemaker, ICD)  _6   _7   NA   Ensure source control  _8   _9   Have all abscesses been drained effectively?  Have deep seeded infections (septic joints or osteomyelitis) had appropriate surgical debridement?   He underwent I and D today with cultures sent   Investigate for "metastatic" sites of infection  _10   _11   Does the patient have ANY symptom or physical exam finding that would suggest a deeper infection (back or neck pain that may be suggestive of vertebral osteomyelitis or epidural abscess, muscle pain that could be a symptom of pyomyositis)?  Keep in mind that for deep seeded infections MRI imaging with contrast is preferred rather than other often insensitive tests such as plain x-rays, especially early in a patient's presentation.  One could consider MRI brain but his TEE was clean. I DONT FEEL  Strong need for this study     Change antibiotic therapy to  vancomycin  _12   _13   Beta-lactam antibiotics are preferred for MSSA due to higher cure rates.  If on Vancomycin, goal trough should be 15 - 20 mcg/mL   Estimated duration of IV antibiotic therapy: 8 WEEKS  _14   _15   Consult case management for probably prolonged outpatient IV antibiotic therapy    #2 Amputation site: see above, VVS took to OR followup cultures  I will followup cultures and arrange for HSFU in my clinic in the next 2-3 weeks     LOS: 6 days   Truman Hayward 04/22/2013, 8:57 PM

## 2013-04-22 NOTE — Progress Notes (Addendum)
Subjective:  No complaints, s/p debridement of left 1st toe amputation wound  this morning   Objective: Vital signs in last 24 hours: Temp:  [97.9 F (36.6 C)-99.4 F (37.4 C)] 99.2 F (37.3 C) (04/09 1300) Pulse Rate:  [55-75] 75 (04/09 1300) Resp:  [18-19] 19 (04/09 1120) BP: (122-160)/(58-82) 129/71 mmHg (04/09 1300) SpO2:  [96 %-100 %] 99 % (04/09 1300) Weight:  [80.1 kg (176 lb 9.4 oz)] 80.1 kg (176 lb 9.4 oz) (04/08 1842) Weight change:   Intake/Output from previous day: 04/08 0701 - 04/09 0700 In: 360 [P.O.:360] Out: 2900  Intake/Output this shift: Total I/O In: 300 [I.V.:300] Out: -   Lab Results:  Recent Labs  04/21/13 1608 04/22/13 0725  WBC 10.8* 12.0*  HGB 10.9* 12.1*  HCT 34.5* 39.1  PLT 241 240   BMET:  Recent Labs  04/21/13 1608 04/22/13 0725  NA 138 138  K 3.3* 3.8  CL 95* 93*  CO2 25 23  GLUCOSE 117* 128*  BUN 44* 28*  CREATININE 7.84* 5.75*  CALCIUM 9.2 9.7  ALBUMIN 2.2*  --    No results found for this basename: PTH,  in the last 72 hours Iron Studies: No results found for this basename: IRON, TIBC, TRANSFERRIN, FERRITIN,  in the last 72 hours  Studies/Results: No results found.  EXAM:  General appearance: Alert, in no apparent distress  Resp: CTA without rales, rhonchi, or wheezes  Cardio: RRR with Gr II/VI systolic murmur, no rub  GI: + BS, soft and nontender  Extremities: No edema, R TMA well healed; L foot with heavy dressing  Access: AVF @ LUA with + bruit   D[facility]: [facility]  4h 400/800 85.5kg Heparin 8700 LUA AVF  Hectorol 1 EPO none   Assessment/Plan: 1. Wound infection left foot / L great toe amp 3-25 / MRSA bacteremia - s/p irrigation & debridement 04/22/13 per Dr. Kellie Simmering, wound culture pending, on Vancomycin, TEE 4/7 showed moderate LVH, EF 65%, no vegetation.  2. ESRD - HD on MWF @ [place], K 3.8. HD tomorrow.  3. HTN/Volume - BP 129/71 on Doxazosin 2 mg qhs, Bystolic 5 mg qhs; below EDW, no signs or symptoms of fluid  overload. 4. Anemia - Hgb 12.1, stable.  5. Sec HPT - Ca 9.1 (10.5 corrected), Hectorol 1 mcg, no binders, 2Ca bath.  6. Nutrition - Alb 2.2, carb-mod renal diet, multivitamin, supplement.  7. DM - insulin per primary.    LOS: 6 days   Ramiro Harvest 04/22/2013,5:37 PM  Pt seen, examined and agree w A/P as above.  [name] MD pager [phone]    cell [phone] 04/22/2013, 5:00 PM

## 2013-04-22 NOTE — Anesthesia Procedure Notes (Signed)
Procedure Name: LMA Insertion Date/Time: 04/22/2013 10:23 AM Performed by: Ned Grace Pre-anesthesia Checklist: Patient identified, Timeout performed, Emergency Drugs available, Suction available and Patient being monitored Patient Re-evaluated:Patient Re-evaluated prior to inductionOxygen Delivery Method: Circle system utilized Preoxygenation: Pre-oxygenation with 100% oxygen Intubation Type: IV induction LMA: LMA inserted LMA Size: 5.0 Number of attempts: 1 Placement Confirmation: positive ETCO2 and breath sounds checked- equal and bilateral Tube secured with: Tape Dental Injury: Teeth and Oropharynx as per pre-operative assessment

## 2013-04-22 NOTE — H&P (View-Only) (Signed)
Patient ID: Jack [name], male   DOB: 09-Sep-1945, 68 y.o.   MRN: [id number] Vascular Surgery Progress Note  Subjective: MRSA positive blood cultures-recent left first toe amputation on 04/07/2013  Objective:  Filed Vitals:   04/21/13 1544  BP: 161/74  Pulse: 61  Temp:   Resp: 19    General alert and oriented x3 Denies pain in left foot First toe amp site examined-dry gangrenous changes along the suture line and skin on the anterior aspect of the foot is dark and of poor quality but no definite cellulitis is noted. No fluctuance noted.   Labs:  Recent Labs Lab 04/16/13 2017 04/17/13 0550 04/19/13 1501  CREATININE 4.00* 4.92*  4.73* 9.47*    Recent Labs Lab 04/16/13 2017 04/17/13 0550 04/19/13 1501  NA 138 137 141  K 3.1* 3.4* 4.0  CL 93* 91* 92*  CO2 30 26 24   BUN 18 21 54*  CREATININE 4.00* 4.92*  4.73* 9.47*  GLUCOSE 230* 150* 135*  CALCIUM 8.6 8.9 9.1    Recent Labs Lab 04/17/13 0550 04/19/13 1310 04/19/13 1501  WBC 14.8*  14.7* 10.9* 10.2  HGB 11.2*  11.1* 9.8* 10.4*  HCT 36.2*  36.2* 31.4* 33.0*  PLT 207  217 210 189    Recent Labs Lab 04/17/13 0550  INR 1.31    I/O last 3 completed shifts: In: 600 [P.O.:600] Out: 2470 [Other:2470]  Imaging: No results found.  Assessment/Plan:   LOS: 5 days  s/p Procedure(s): TRANSESOPHAGEAL ECHOCARDIOGRAM (TEE)  Patient now 2 weeks post left first toe amputation with known severe tibial disease in disadvantage runoff.  MRSA positive blood cultures WBC-down to 10,000 and patient currently afebrile  Plan incision and drainage of first toe amputation site in the OR tomorrow and will pack wound open and debride as necessary Discuss with patient and he is agreeable   [name], MD 04/21/2013 4:15 PM

## 2013-04-22 NOTE — Op Note (Signed)
OPERATIVE REPORT  Date of Surgery: 04/16/2013 - 04/22/2013  Surgeon: [name], MD  Assistant: Nurse  Pre-op Diagnosis: Poorly healing left first toe amputation with MRSA positive blood culture  Post-op Diagnosis: Same  Procedure: Procedure(s): IRRIGATION AND DEBRIDEMENT LEFT FIRST TOE AMPUTATION WOUND   Anesthesia: General  EBL: 0  Complications: None  Patient taken the operating room placed in supine position at which time satisfactory LMA general anesthesia was administered. Left foot was prepped Betadine scrub and solution draped in routine sterile manner. Left first toe amputation had been performed by me approximately 2 weeks earlier. There was dry gangrene extending back about 3 mm the suture line but otherwise it was healed. The skin anteriorly was chronically ischemic in appearance. Sutures were removed and the wound was opened. There was some purulent material in the wound but not an abundant amount and this was cultured. Tissue looked ischemic particularly anteriorly but also posteriorly. This was debrided back and the rim of skin that was gangrenous was excised. There is no evidence of infection in the bone. After debriding this anteriorly and posteriorly and her thoroughly irrigating with saline it was then packed with a moist saline gauze wrapped with Kerlix and loosely with an Ace wrap patient taken to recovery in stable condition  Procedure Details:   [name], MD 04/22/2013 11:00 AM

## 2013-04-22 NOTE — Anesthesia Postprocedure Evaluation (Signed)
Anesthesia Post Note  Patient: Jack [name]  Procedure(s) Performed: Procedure(s) (LRB): IRRIGATION AND DEBRIDEMENT LEFT FIRST TOE AMPUTATION WOUND  (Left)  Anesthesia type: General  Patient location: PACU  Post pain: Pain level controlled and Adequate analgesia  Post assessment: Post-op Vital signs reviewed, Patient's Cardiovascular Status Stable, Respiratory Function Stable, Patent Airway and Pain level controlled  Last Vitals:  Filed Vitals:   04/22/13 1120  BP: 122/59  Pulse: 69  Temp: 36.8 C  Resp: 19    Post vital signs: Reviewed and stable  Level of consciousness: awake, alert  and oriented  Complications: No apparent anesthesia complications

## 2013-04-22 NOTE — Progress Notes (Signed)
PT Cancellation Note  Patient Details Name: Jack [name] MRN: [id number] DOB: 09-09-1945   Cancelled Treatment:     Pt off the floor in surgery. Will re-attempt to evaluate pt at next available time.    [place], [place] 04/22/2013, 9:30 AM

## 2013-04-22 NOTE — Progress Notes (Signed)
[facility] PROGRESS NOTE  Jack [name] BMW:[id number] DOB: Oct 12, 1945 DOA: 04/16/2013 PCP: [name], MD   Interim History:  Jack [name]. He has a very complex medical history with diabetic foot infection. He was found to have gangrene of his left great toe. 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 He underwent amputation of his left great toe by Dr. Kellie Simmering on 04/07/2013       Assessment/Plan: #1 MRSA bacteremia Likely seeded from wound infection. MRI of the foot negative for abscess or osteomyelitis TEE 04/20/2013 was negative for SBE with ejection fraction of 65%. Patient currently afebrile.white count is 12.Patient will likely need 8 weeks of IV antibiotics. Infectious disease is following and appreciate input and recommendations.  #2 poorly healing left first toe amputation Patient with a gangrenous foot. Patient is status post I&D of left first toe amputation wound per Dr. Kellie Simmering today. Continue IV antibiotics. Per Vascular surgery.  #3 diabetes mellitus type 1 Stable. Continue sliding scale insulin.  #4 hypertension Stable. Continue Cardura,bystolic.  #5 hypothyroidism Continue Synthroid.  #6 end-stage renal disease on hemodialysis Patient has dialysis Monday Wednesdays and Fridays. Nephrology is following. Per renal.  #7 anemia of chronic disease Likely secondary to problem #6. H&H stable follow.  Code Status: full Family Communication: updated patient and family at bedside. Disposition Plan: home versus SNF when medically stable.   Consultants:  Infectious diseases: Dr. Lucianne Lei dam 04/19/2013  Nephrology: Dr. Posey Pronto 04/17/2013  Vascular surgery Dr. Donnetta Hutching 04/17/2013  Procedures:  I and D left first toe amputation wound 04/22/2013 per Dr. Kellie Simmering  MRI left foot 04/18/2013  MRI left ankle 04/17/2013  TEE 04/20/2013  Antibiotics:  IV vancomycin 4/4/ 2015  IV Zosyn 04/17/2013 - 04/19/2013  IV  nafcillin 04/19/2013 - 04/20/2013  HPI/Subjective: Patient with no complaints. Patient s/p I and D earlier on today.  Objective: Filed Vitals:   04/22/13 1300  BP: 129/71  Pulse: 75  Temp: 99.2 F (37.3 C)  Resp:     Intake/Output Summary (Last 24 hours) at 04/22/13 1411 Last data filed at 04/22/13 1300  Gross per 24 hour  Intake    300 ml  Output   2900 ml  Net  -2600 ml   Filed Weights   04/19/13 1907 04/21/13 1536 04/21/13 1842  Weight: 81.8 kg (180 lb 5.4 oz) 84.1 kg (185 lb 6.5 oz) 80.1 kg (176 lb 9.4 oz)    Exam:   General:  NAD  Cardiovascular: RRR  Respiratory: CTAB  Abdomen: SOFT, NONTENDER, NONDISTENDED, POSITIVE BOWEL SOUNDS.  Musculoskeletal: NO CLUBBING CYANOSIS OR EDEMA. lEFT FOOT WRAPPED STATUS POST i&d.  Data Reviewed: Basic Metabolic Panel:  Recent Labs Lab 04/16/13 2017 04/17/13 0550 04/19/13 1501 04/21/13 1608 04/22/13 0725  NA 138 137 141 138 138  K 3.1* 3.4* 4.0 3.3* 3.8  CL 93* 91* 92* 95* 93*  CO2 30 26 24 25 23   GLUCOSE 230* 150* 135* 117* 128*  BUN 18 21 54* 44* 28*  CREATININE 4.00* 4.92*  4.73* 9.47* 7.84* 5.75*  CALCIUM 8.6 8.9 9.1 9.2 9.7  PHOS  --   --  5.7* 3.6  --    Liver Function Tests:  Recent Labs Lab 04/19/13 1501 04/21/13 1608  ALBUMIN 2.2* 2.2*   No results found for this basename: LIPASE, AMYLASE,  in the last 168 hours No results found for this basename: AMMONIA,  in the last 168 hours CBC:  Recent Labs Lab 04/16/13 2017 04/17/13 0550 04/19/13 1310 04/19/13 1501 04/21/13  1608 04/22/13 0725  WBC 14.0* 14.8*  14.7* 10.9* 10.2 10.8* 12.0*  NEUTROABS 12.3*  --  8.0*  --   --   --   HGB 11.0* 11.2*  11.1* 9.8* 10.4* 10.9* 12.1*  HCT 35.4* 36.2*  36.2* 31.4* 33.0* 34.5* 39.1  MCV 86.8 86.6  86.8 85.3 85.3 83.7 86.5  PLT 197 207  217 210 189 241 240   Cardiac Enzymes: No results found for this basename: CKTOTAL, CKMB, CKMBINDEX, TROPONINI,  in the last 168 hours BNP (last 3 results) No  results found for this basename: PROBNP,  in the last 8760 hours CBG:  Recent Labs Lab 04/21/13 0744 04/21/13 1145 04/21/13 2153 04/22/13 0809 04/22/13 1104  GLUCAP 151* 189* 132* 135* 134*    Recent Results (from the past 240 hour(s))  SURGICAL PCR SCREEN     Status: None   Collection Time    04/17/13  5:10 AM      Result Value Ref Range Status   MRSA, PCR NEGATIVE  NEGATIVE Final   Staphylococcus aureus NEGATIVE  NEGATIVE Final   Comment:            The Xpert SA Assay (FDA     approved for NASAL specimens     in patients over 4 years of age),     is one component of     a comprehensive surveillance     program.  Test performance has     been validated by [employer] for patients greater     than or equal to 5 year old.     It is not intended     to diagnose infection nor to     guide or monitor treatment.  CULTURE, BLOOD (ROUTINE X 2)     Status: None   Collection Time    04/17/13  5:48 AM      Result Value Ref Range Status   Specimen Description BLOOD RIGHT HAND   Final   Special Requests BOTTLES DRAWN AEROBIC ONLY 5CC   Final   Culture  Setup Time     Final   Value: 04/17/2013 12:44     Performed at [facility]   Culture     Final   Value: METHICILLIN RESISTANT STAPHYLOCOCCUS AUREUS     Note: RIFAMPIN AND GENTAMICIN SHOULD NOT BE USED AS SINGLE DRUGS FOR TREATMENT OF STAPH INFECTIONS. This organism DOES NOT demonstrate inducible Clindamycin resistance in vitro. CRITICAL RESULT CALLED TO, READ BACK BY AND VERIFIED WITH: JAMIE COVINGTON      04/19/13 1437 BY SMITHERSJ     Note: Gram Stain Report Called to,Read Back By and Verified With: Virgilio Frees 04/18/12 @ 10:45AM BY RUSCOE A.     Performed at [facility]   Report Status 04/20/2013 FINAL   Final   Organism ID, Bacteria METHICILLIN RESISTANT STAPHYLOCOCCUS AUREUS   Final  CULTURE, BLOOD (ROUTINE X 2)     Status: None   Collection Time    04/17/13  5:50 AM      Result Value Ref Range  Status   Specimen Description BLOOD RIGHT HAND   Final   Special Requests     Final   Value: BOTTLES DRAWN AEROBIC AND ANAEROBIC 10CC BLUE,5CC RED   Culture  Setup Time     Final   Value: 04/17/2013 12:44     Performed at [facility]   Culture     Final   Value: STAPHYLOCOCCUS  AUREUS     Note: SUSCEPTIBILITIES PERFORMED ON PREVIOUS CULTURE WITHIN THE LAST 5 DAYS.     Note: Gram Stain Report Called to,Read Back By and Verified With: Earleen Reaper RN on 04/18/13 at 05:50 by Rise Mu     Performed at [facility]   Report Status 04/20/2013 FINAL   Final  CULTURE, BLOOD (ROUTINE X 2)     Status: None   Collection Time    04/19/13  1:04 PM      Result Value Ref Range Status   Specimen Description BLOOD RIGHT HAND   Final   Special Requests BOTTLES DRAWN AEROBIC ONLY [facility]   Final   Culture  Setup Time     Final   Value: 04/19/2013 16:06     Performed at [facility]   Culture     Final   Value:        BLOOD CULTURE RECEIVED NO GROWTH TO DATE CULTURE WILL BE HELD FOR 5 DAYS BEFORE ISSUING A FINAL NEGATIVE REPORT     Performed at [facility]   Report Status PENDING   Incomplete  CULTURE, BLOOD (ROUTINE X 2)     Status: None   Collection Time    04/19/13  1:10 PM      Result Value Ref Range Status   Specimen Description BLOOD RIGHT ANTECUBITAL   Final   Special Requests BOTTLES DRAWN AEROBIC ONLY 5CC   Final   Culture  Setup Time     Final   Value: 04/19/2013 16:06     Performed at [facility]   Culture     Final   Value:        BLOOD CULTURE RECEIVED NO GROWTH TO DATE CULTURE WILL BE HELD FOR 5 DAYS BEFORE ISSUING A FINAL NEGATIVE REPORT     Performed at [facility]   Report Status PENDING   Incomplete     Studies: No results found.  Scheduled Meds: . colchicine  0.6 mg Oral Daily  . doxazosin  2 mg Oral QHS  . doxercalciferol  1 mcg Intravenous Q M,W,F-HD  . feeding supplement (NEPRO CARB STEADY)  237 mL Oral BID BM  .  heparin  5,000 Units Subcutaneous 3 times per day  . insulin aspart  0-5 Units Subcutaneous QHS  . insulin aspart  0-9 Units Subcutaneous TID WC  . levothyroxine  175 mcg Oral QAC breakfast  . multivitamin  1 tablet Oral QHS  . nebivolol  5 mg Oral QHS  . sodium chloride  3 mL Intravenous Q12H  . vancomycin  1,000 mg Intravenous Q M,W,F-HD   Continuous Infusions: . sodium chloride 20 mL/hr at 04/22/13 1002    Principal Problem:   MRSA bacteremia Active Problems:   Essential hypertension, benign   Diabetes mellitus type 1, controlled, with complications   Anemia in chronic kidney disease   End stage renal disease   Peripheral vascular disease, unspecified   Diabetes mellitus   Toe gangrene   Wound infection after surgery   Fever, unspecified    Time spent: 35 minutes    Eugenie Filler M.D. [facility] Pager [phone]. If 7PM-7AM, please contact night-coverage at [email or web address], password [facility] 04/22/2013, 2:11 PM  LOS: 6 days

## 2013-04-23 DIAGNOSIS — R197 Diarrhea, unspecified: Secondary | ICD-10-CM | POA: Diagnosis not present

## 2013-04-23 LAB — CBC
HEMATOCRIT: 35 % — AB (ref 39.0–52.0)
Hemoglobin: 11 g/dL — ABNORMAL LOW (ref 13.0–17.0)
MCH: 26.8 pg (ref 26.0–34.0)
MCHC: 31.4 g/dL (ref 30.0–36.0)
MCV: 85.2 fL (ref 78.0–100.0)
Platelets: 256 10*3/uL (ref 150–400)
RBC: 4.11 MIL/uL — AB (ref 4.22–5.81)
RDW: 14.8 % (ref 11.5–15.5)
WBC: 12.9 10*3/uL — ABNORMAL HIGH (ref 4.0–10.5)

## 2013-04-23 LAB — RENAL FUNCTION PANEL
Albumin: 2.2 g/dL — ABNORMAL LOW (ref 3.5–5.2)
BUN: 38 mg/dL — AB (ref 6–23)
CO2: 25 mEq/L (ref 19–32)
Calcium: 9.5 mg/dL (ref 8.4–10.5)
Chloride: 93 mEq/L — ABNORMAL LOW (ref 96–112)
Creatinine, Ser: 7.63 mg/dL — ABNORMAL HIGH (ref 0.50–1.35)
GFR calc Af Amer: 7 mL/min — ABNORMAL LOW (ref >90)
GFR calc non Af Amer: 6 mL/min — ABNORMAL LOW (ref >90)
GLUCOSE: 174 mg/dL — AB (ref 70–99)
PHOSPHORUS: 4.9 mg/dL — AB (ref 2.3–4.6)
Potassium: 3.2 mEq/L — ABNORMAL LOW (ref 3.7–5.3)
Sodium: 137 mEq/L (ref 137–147)

## 2013-04-23 LAB — GLUCOSE, CAPILLARY
GLUCOSE-CAPILLARY: 131 mg/dL — AB (ref 70–99)
Glucose-Capillary: 175 mg/dL — ABNORMAL HIGH (ref 70–99)
Glucose-Capillary: 101 mg/dL — ABNORMAL HIGH (ref 70–99)
Glucose-Capillary: 125 mg/dL — ABNORMAL HIGH (ref 70–99)

## 2013-04-23 LAB — VANCOMYCIN, RANDOM: Vancomycin Rm: 17.1 ug/mL

## 2013-04-23 MED ORDER — SODIUM CHLORIDE 0.9 % IV SOLN: 100.0000 mL | INTRAVENOUS | Status: DC | PRN

## 2013-04-23 MED ORDER — ALTEPLASE 2 MG IJ SOLR
2.0000 mg | Freq: Once | INTRAMUSCULAR | Status: DC | PRN
  Filled 2013-04-23: qty 2

## 2013-04-23 MED ORDER — HEPARIN SODIUM (PORCINE) 1000 UNIT/ML DIALYSIS: 1000.0000 [IU] | INTRAMUSCULAR | Status: DC | PRN

## 2013-04-23 MED ORDER — HEPARIN SODIUM (PORCINE) 1000 UNIT/ML DIALYSIS: 20.0000 [IU]/kg | Freq: Once | INTRAMUSCULAR | Status: DC

## 2013-04-23 MED ORDER — SILVER SULFADIAZINE 1 % EX CREA
TOPICAL_CREAM | Freq: Every day | CUTANEOUS | Status: DC
  Administered 2013-04-23 – 2013-04-24 (×2): via TOPICAL
  Administered 2013-04-25: 1 via TOPICAL
  Administered 2013-04-26: 11:00:00 via TOPICAL
  Filled 2013-04-23: qty 85

## 2013-04-23 MED ORDER — LIDOCAINE-PRILOCAINE 2.5-2.5 % EX CREA: 1.0000 "application " | TOPICAL_CREAM | CUTANEOUS | Status: DC | PRN

## 2013-04-23 MED ORDER — NEPRO/CARBSTEADY PO LIQD
237.0000 mL | ORAL | Status: DC | PRN
  Filled 2013-04-23: qty 237

## 2013-04-23 MED ORDER — PENTAFLUOROPROP-TETRAFLUOROETH EX AERO: 1.0000 "application " | INHALATION_SPRAY | CUTANEOUS | Status: DC | PRN

## 2013-04-23 MED ORDER — DOXERCALCIFEROL 4 MCG/2ML IV SOLN
INTRAVENOUS | Status: AC
  Administered 2013-04-23: 1 ug
  Filled 2013-04-23: qty 2

## 2013-04-23 MED ORDER — LIDOCAINE HCL (PF) 1 % IJ SOLN: 5.0000 mL | INTRAMUSCULAR | Status: DC | PRN

## 2013-04-23 NOTE — Consult Note (Signed)
Reason for Consult: Gangrene status post left great toe amputation Referring Physician: Dr. Wallie Renshaw is an 68 y.o. male.  HPI: Patient is a 68 year old gentleman end stage renal disease diabetes severe peripheral vascular disease who is status post limb salvage surgery with amputation of the left great toe.  Past Medical History  Diagnosis Date  . Hypertension   . Diabetes mellitus without complication   . Shortness of breath   . Pneumonia     2012  . Heart murmur   . Glaucoma   . Multiple myeloma, without mention of having achieved remission 03/30/2012    Cytogenetic neg on 03/23/2012.  . End stage renal disease on dialysis   . Asthma   . Hyperparathyroidism, secondary renal   . Peripheral arterial disease     Past Surgical History  Procedure Laterality Date  . Thyroidectomy    . Cervical disc surgery    . Eye surgery      CATARACTS  . Insertion of dialysis catheter Right 03/19/2012    Procedure: INSERTION OF DIALYSIS CATHETER;  Surgeon: Mal Misty, MD;  Location: [place];  Service: Vascular;  Laterality: Right;  Right Internal Jugular  . Av fistula placement Left 03/25/2012    Procedure: ARTERIOVENOUS (AV) FISTULA CREATION;  Surgeon: Mal Misty, MD;  Location: [place];  Service: Vascular;  Laterality: Left;  . Ligation of competing branches of arteriovenous fistula Left 05/08/2012    Procedure: LIGATION OF COMPETING BRANCHES OF ARTERIOVENOUS FISTULA;  Surgeon: Mal Misty, MD;  Location: [place];  Service: Vascular;  Laterality: Left;  Ultrasound guided  . Cardiac catheterization      approx 30 years ago  . Amputation Right 11/10/2012    Procedure: AMPUTATION FIRST and SECOND TOES Right Foot;  Surgeon: Elam Dutch, MD;  Location: [facility];  Service: Vascular;  Laterality: Right;  . Transmetatarsal amputation Left 12/16/2012    Procedure: TRANSMETATARSAL AMPUTATION AND VAC PLACEMENT;  Surgeon: Elam Dutch, MD;  Location: [place];  Service: Vascular;   Laterality: Left;  . Amputation Left 04/07/2013    Procedure: AMPUTATION DIGIT- LEFT 1ST TOE;  Surgeon: Mal Misty, MD;  Location: [place];  Service: Vascular;  Laterality: Left;  . Tee without cardioversion N/A 04/20/2013    Procedure: TRANSESOPHAGEAL ECHOCARDIOGRAM (TEE);  Surgeon: Josue Hector, MD;  Location[facility];  Service: Cardiovascular;  Laterality: N/A;    Family History  Problem Relation Age of Onset  . Diabetes Mother   . Cancer Mother     bone   . Kidney disease Father     Social History:  reports that he quit smoking about 31 years ago. His smoking use included Cigarettes. He smoked 0.00 packs per day. He has never used smokeless tobacco. He reports that he does not drink alcohol or use illicit drugs.  Allergies:  Allergies  Allergen Reactions  . Lisinopril Cough  . Ivp Dye [Iodinated Diagnostic Agents] Hives  . Morphine And Related Other (See Comments)    Bradycardia states patient    Medications: I have reviewed the patient's current medications.  Results for orders placed during the hospital encounter of 04/16/13 (from the past 48 hour(s))  GLUCOSE, CAPILLARY     Status: Abnormal   Collection Time    04/21/13  7:44 AM      Result Value Ref Range   Glucose-Capillary 151 (*) 70 - 99 mg/dL  GLUCOSE, CAPILLARY     Status: Abnormal   Collection  Time    04/21/13 11:45 AM      Result Value Ref Range   Glucose-Capillary 189 (*) 70 - 99 mg/dL  CBC     Status: Abnormal   Collection Time    04/21/13  4:08 PM      Result Value Ref Range   WBC 10.8 (*) 4.0 - 10.5 K/uL   RBC 4.12 (*) 4.22 - 5.81 MIL/uL   Hemoglobin 10.9 (*) 13.0 - 17.0 g/dL   HCT 34.5 (*) 39.0 - 52.0 %   MCV 83.7  78.0 - 100.0 fL   MCH 26.5  26.0 - 34.0 pg   MCHC 31.6  30.0 - 36.0 g/dL   RDW 14.8  11.5 - 15.5 %   Platelets 241  150 - 400 K/uL  RENAL FUNCTION PANEL     Status: Abnormal   Collection Time    04/21/13  4:08 PM      Result Value Ref Range   Sodium 138  137 - 147 mEq/L    Potassium 3.3 (*) 3.7 - 5.3 mEq/L   Chloride 95 (*) 96 - 112 mEq/L   CO2 25  19 - 32 mEq/L   Glucose, Bld 117 (*) 70 - 99 mg/dL   BUN 44 (*) 6 - 23 mg/dL   Creatinine, Ser 7.84 (*) 0.50 - 1.35 mg/dL   Calcium 9.2  8.4 - 10.5 mg/dL   Phosphorus 3.6  2.3 - 4.6 mg/dL   Albumin 2.2 (*) 3.5 - 5.2 g/dL   GFR calc non Af Amer 6 (*) >90 mL/min   GFR calc Af Amer 7 (*) >90 mL/min   Comment: (NOTE)     The eGFR has been calculated using the CKD EPI equation.     This calculation has not been validated in all clinical situations.     eGFR's persistently <90 mL/min signify possible Chronic Kidney     Disease.  GLUCOSE, CAPILLARY     Status: Abnormal   Collection Time    04/21/13  9:53 PM      Result Value Ref Range   Glucose-Capillary 132 (*) 70 - 99 mg/dL  PROTIME-INR     Status: None   Collection Time    04/22/13  7:25 AM      Result Value Ref Range   Prothrombin Time 14.1  11.6 - 15.2 seconds   INR 1.11  0.00 - 7.07  BASIC METABOLIC PANEL     Status: Abnormal   Collection Time    04/22/13  7:25 AM      Result Value Ref Range   Sodium 138  137 - 147 mEq/L   Potassium 3.8  3.7 - 5.3 mEq/L   Chloride 93 (*) 96 - 112 mEq/L   CO2 23  19 - 32 mEq/L   Glucose, Bld 128 (*) 70 - 99 mg/dL   BUN 28 (*) 6 - 23 mg/dL   Creatinine, Ser 5.75 (*) 0.50 - 1.35 mg/dL   Calcium 9.7  8.4 - 10.5 mg/dL   GFR calc non Af Amer 9 (*) >90 mL/min   GFR calc Af Amer 10 (*) >90 mL/min   Comment: (NOTE)     The eGFR has been calculated using the CKD EPI equation.     This calculation has not been validated in all clinical situations.     eGFR's persistently <90 mL/min signify possible Chronic Kidney     Disease.  CBC     Status: Abnormal   Collection Time  04/22/13  7:25 AM      Result Value Ref Range   WBC 12.0 (*) 4.0 - 10.5 K/uL   RBC 4.52  4.22 - 5.81 MIL/uL   Hemoglobin 12.1 (*) 13.0 - 17.0 g/dL   HCT 39.1  39.0 - 52.0 %   MCV 86.5  78.0 - 100.0 fL   MCH 26.8  26.0 - 34.0 pg   MCHC 30.9  30.0  - 36.0 g/dL   RDW 14.9  11.5 - 15.5 %   Platelets 240  150 - 400 K/uL  GLUCOSE, CAPILLARY     Status: Abnormal   Collection Time    04/22/13  8:09 AM      Result Value Ref Range   Glucose-Capillary 135 (*) 70 - 99 mg/dL  WOUND CULTURE     Status: None   Collection Time    04/22/13 10:37 AM      Result Value Ref Range   Specimen Description WOUND LEFT FOOT     Special Requests LEFT FIRST TOE PT ON VANCO VANCO     Gram Stain       Value: FEW WBC PRESENT, PREDOMINANTLY PMN     FEW SQUAMOUS EPITHELIAL CELLS PRESENT     MODERATE GRAM POSITIVE COCCI IN CLUSTERS     MODERATE GRAM POSITIVE RODS     Performed at [facility]   Culture PENDING     Report Status PENDING    GLUCOSE, CAPILLARY     Status: Abnormal   Collection Time    04/22/13 11:04 AM      Result Value Ref Range   Glucose-Capillary 134 (*) 70 - 99 mg/dL  GLUCOSE, CAPILLARY     Status: Abnormal   Collection Time    04/22/13  5:12 PM      Result Value Ref Range   Glucose-Capillary 173 (*) 70 - 99 mg/dL  GLUCOSE, CAPILLARY     Status: Abnormal   Collection Time    04/22/13  9:26 PM      Result Value Ref Range   Glucose-Capillary 208 (*) 70 - 99 mg/dL    No results found.  Review of Systems  All other systems reviewed and are negative.  Blood pressure 156/69, pulse 64, temperature 98.1 F (36.7 C), temperature source Oral, resp. rate 18, height '5\' 10"'  (1.778 m), weight 81.6 kg (179 lb 14.3 oz), SpO2 97.00%. Physical Exam On examination patient has progressive ischemic changes through the left great toe amputation site. There is no purulence no red streaking no signs of abscess. Assessment/Plan: Assessment: Progressive ischemic changes left great toe amputation site.  Plan: We will have the patient started with Silvadene dressing changes. I will be out of town this weekend. If patient is discharged to home this weekend I will followup in the office. Otherwise we'll reevaluate Monday morning for possible  revision surgery Monday evening.  Newt Minion 04/23/2013, 6:47 AM

## 2013-04-23 NOTE — Progress Notes (Signed)
[facility] PROGRESS NOTE  Jack [name] HFW:[id number] DOB: [birth year] DOA: 04/16/2013 PCP: [name], MD   Interim History:  Jack [name]. He has a very complex medical history with diabetic foot infection. He was found to have gangrene of his left great toe. 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 He underwent amputation of his left great toe by Dr. Kellie Simmering on 04/07/2013       Assessment/Plan: #1 MRSA bacteremia Likely seeded from wound infection. MRI of the foot negative for abscess or osteomyelitis TEE 04/20/2013 was negative for SBE with ejection fraction of 65%. Patient currently afebrile.white count is 12. Patient will likely need 8 weeks of IV antibiotics. Infectious disease is following and appreciate input and recommendations.  #2 poorly healing left first toe amputation Patient with a gangrenous foot. Patient is status post I&D of left first toe amputation wound per Dr. Kellie Simmering yesterday. Patient was seen by Dr. Sharol Given in consultation early this morning and was to reevaluate patient on Monday morning for possible revision surgery on Monday evening. Continue IV antibiotics. Per Vascular surgery.  #3 diabetes mellitus type 1 Stable. Continue sliding scale insulin.  #4 hypertension Stable. Continue Cardura,bystolic.  #5 hypothyroidism Continue Synthroid.  #6 end-stage renal disease on hemodialysis Patient has dialysis Monday Wednesdays and Fridays. Nephrology is following. Per renal.  #7 anemia of chronic disease Likely secondary to problem #6. H&H stable follow.  #8 diarrhea C. difficile PCR is pending. May be secondary to Nephro-Vite. May need to discontinue Nephro-Vite if C. difficile PCR is negative.  Code Status: full Family Communication: updated patient in hemodialysis Disposition Plan: home versus SNF when medically stable.   Consultants:  Infectious diseases: Dr. Lucianne Lei dam 04/19/2013  Nephrology:  Dr. Posey Pronto 04/17/2013  Vascular surgery Dr. Donnetta Hutching 04/17/2013  Orthopedics: Dr. Sharol Given 04/23/2013  Procedures:  I and D left first toe amputation wound 04/22/2013 per Dr. Kellie Simmering  MRI left foot 04/18/2013  MRI left ankle 04/17/2013  TEE 04/20/2013  Antibiotics:  IV vancomycin 4/4/ 2015  IV Zosyn 04/17/2013 - 04/19/2013  IV nafcillin 04/19/2013 - 04/20/2013  HPI/Subjective: Patient with states has been having multiple loose stools after starting Nephro-Vite 2-3 days ago. Patient states left foot is feeling better.   Objective: Filed Vitals:   04/23/13 1412  BP:   Pulse:   Temp: 98.2 F (36.8 C)  Resp:     Intake/Output Summary (Last 24 hours) at 04/23/13 1430 Last data filed at 04/23/13 0900  Gross per 24 hour  Intake    240 ml  Output      0 ml  Net    240 ml   Filed Weights   04/21/13 1842 04/22/13 2059 04/23/13 1412  Weight: 80.1 kg (176 lb 9.4 oz) 81.6 kg (179 lb 14.3 oz) 79.9 kg (176 lb 2.4 oz)    Exam:   General:  NAD  Cardiovascular: RRR  Respiratory: CTAB  Abdomen: SOFT, NONTENDER, NONDISTENDED, POSITIVE BOWEL SOUNDS.  Musculoskeletal: NO CLUBBING CYANOSIS OR EDEMA. lEFT FOOT WRAPPED STATUS POST i&d.  Data Reviewed: Basic Metabolic Panel:  Recent Labs Lab 04/17/13 0550 04/19/13 1501 04/21/13 1608 04/22/13 0725 04/23/13 1156  NA 137 141 138 138 137  K 3.4* 4.0 3.3* 3.8 3.2*  CL 91* 92* 95* 93* 93*  CO2 26 24 25 23 25   GLUCOSE 150* 135* 117* 128* 174*  BUN 21 54* 44* 28* 38*  CREATININE 4.92*  4.73* 9.47* 7.84* 5.75* 7.63*  CALCIUM 8.9 9.1 9.2 9.7 9.5  PHOS  --  5.7* 3.6  --  4.9*   Liver Function Tests:  Recent Labs Lab 04/19/13 1501 04/21/13 1608 04/23/13 1156  ALBUMIN 2.2* 2.2* 2.2*   No results found for this basename: LIPASE, AMYLASE,  in the last 168 hours No results found for this basename: AMMONIA,  in the last 168 hours CBC:  Recent Labs Lab 04/16/13 2017  04/19/13 1310 04/19/13 1501 04/21/13 1608  04/22/13 0725 04/23/13 1156  WBC 14.0*  < > 10.9* 10.2 10.8* 12.0* 12.9*  NEUTROABS 12.3*  --  8.0*  --   --   --   --   HGB 11.0*  < > 9.8* 10.4* 10.9* 12.1* 11.0*  HCT 35.4*  < > 31.4* 33.0* 34.5* 39.1 35.0*  MCV 86.8  < > 85.3 85.3 83.7 86.5 85.2  PLT 197  < > 210 189 241 240 256  < > = values in this interval not displayed. Cardiac Enzymes: No results found for this basename: CKTOTAL, CKMB, CKMBINDEX, TROPONINI,  in the last 168 hours BNP (last 3 results) No results found for this basename: PROBNP,  in the last 8760 hours CBG:  Recent Labs Lab 04/22/13 1104 04/22/13 1712 04/22/13 2126 04/23/13 0823 04/23/13 1113  GLUCAP 134* 173* 208* 131* 175*    Recent Results (from the past 240 hour(s))  SURGICAL PCR SCREEN     Status: None   Collection Time    04/17/13  5:10 AM      Result Value Ref Range Status   MRSA, PCR NEGATIVE  NEGATIVE Final   Staphylococcus aureus NEGATIVE  NEGATIVE Final   Comment:            The Xpert SA Assay (FDA     approved for NASAL specimens     in patients over 40 years of age),     is one component of     a comprehensive surveillance     program.  Test performance has     been validated by [employer] for patients greater     than or equal to 70 year old.     It is not intended     to diagnose infection nor to     guide or monitor treatment.  CULTURE, BLOOD (ROUTINE X 2)     Status: None   Collection Time    04/17/13  5:48 AM      Result Value Ref Range Status   Specimen Description BLOOD RIGHT HAND   Final   Special Requests BOTTLES DRAWN AEROBIC ONLY 5CC   Final   Culture  Setup Time     Final   Value: 04/17/2013 12:44     Performed at [facility]   Culture     Final   Value: METHICILLIN RESISTANT STAPHYLOCOCCUS AUREUS     Note: RIFAMPIN AND GENTAMICIN SHOULD NOT BE USED AS SINGLE DRUGS FOR TREATMENT OF STAPH INFECTIONS. This organism DOES NOT demonstrate inducible Clindamycin resistance in vitro. CRITICAL RESULT CALLED  TO, READ BACK BY AND VERIFIED WITH: JAMIE COVINGTON      04/19/13 1437 BY SMITHERSJ     Note: Gram Stain Report Called to,Read Back By and Verified With: Dianna Limbo 04/18/12 @ 10:45AM BY RUSCOE A.     Performed at [facility]   Report Status 04/20/2013 FINAL   Final   Organism ID, Bacteria METHICILLIN RESISTANT STAPHYLOCOCCUS AUREUS   Final  CULTURE, BLOOD (ROUTINE X 2)     Status: None  Collection Time    04/17/13  5:50 AM      Result Value Ref Range Status   Specimen Description BLOOD RIGHT HAND   Final   Special Requests     Final   Value: BOTTLES DRAWN AEROBIC AND ANAEROBIC 10CC BLUE,5CC RED   Culture  Setup Time     Final   Value: 04/17/2013 12:44     Performed at [facility]   Culture     Final   Value: STAPHYLOCOCCUS AUREUS     Note: SUSCEPTIBILITIES PERFORMED ON PREVIOUS CULTURE WITHIN THE LAST 5 DAYS.     Note: Gram Stain Report Called to,Read Back By and Verified With: Earleen Reaper RN on 04/18/13 at 05:50 by Rise Mu     Performed at [facility]   Report Status 04/20/2013 FINAL   Final  CULTURE, BLOOD (ROUTINE X 2)     Status: None   Collection Time    04/19/13  1:04 PM      Result Value Ref Range Status   Specimen Description BLOOD RIGHT HAND   Final   Special Requests BOTTLES DRAWN AEROBIC ONLY [facility]   Final   Culture  Setup Time     Final   Value: 04/19/2013 16:06     Performed at [facility]   Culture     Final   Value:        BLOOD CULTURE RECEIVED NO GROWTH TO DATE CULTURE WILL BE HELD FOR 5 DAYS BEFORE ISSUING A FINAL NEGATIVE REPORT     Performed at [facility]   Report Status PENDING   Incomplete  CULTURE, BLOOD (ROUTINE X 2)     Status: None   Collection Time    04/19/13  1:10 PM      Result Value Ref Range Status   Specimen Description BLOOD RIGHT ANTECUBITAL   Final   Special Requests BOTTLES DRAWN AEROBIC ONLY 5CC   Final   Culture  Setup Time     Final   Value: 04/19/2013 16:06     Performed at [facility]   Culture     Final   Value:        BLOOD CULTURE RECEIVED NO GROWTH TO DATE CULTURE WILL BE HELD FOR 5 DAYS BEFORE ISSUING A FINAL NEGATIVE REPORT     Performed at [facility]   Report Status PENDING   Incomplete  WOUND CULTURE     Status: None   Collection Time    04/22/13 10:37 AM      Result Value Ref Range Status   Specimen Description WOUND LEFT FOOT   Final   Special Requests LEFT FIRST TOE PT ON VANCO VANCO   Final   Gram Stain     Final   Value: FEW WBC PRESENT, PREDOMINANTLY PMN     FEW SQUAMOUS EPITHELIAL CELLS PRESENT     MODERATE GRAM POSITIVE COCCI IN CLUSTERS     MODERATE GRAM POSITIVE RODS     Performed at [facility]     Final   Value: Culture reincubated for better growth     Performed at [facility]   Report Status PENDING   Incomplete     Studies: No results found.  Scheduled Meds: . colchicine  0.6 mg Oral Daily  . doxazosin  2 mg Oral QHS  . doxercalciferol  1 mcg Intravenous Q M,W,F-HD  . feeding supplement (NEPRO CARB STEADY)  237 mL  Oral BID BM  . heparin  5,000 Units Subcutaneous 3 times per day  . insulin aspart  0-5 Units Subcutaneous QHS  . insulin aspart  0-9 Units Subcutaneous TID WC  . levothyroxine  175 mcg Oral QAC breakfast  . multivitamin  1 tablet Oral QHS  . nebivolol  5 mg Oral QHS  . silver sulfADIAZINE   Topical Daily  . sodium chloride  3 mL Intravenous Q12H  . vancomycin  1,000 mg Intravenous Q M,W,F-HD   Continuous Infusions: . sodium chloride 20 mL/hr at 04/22/13 1002    Principal Problem:   MRSA bacteremia Active Problems:   Essential hypertension, benign   Diabetes mellitus type 1, controlled, with complications   Anemia in chronic kidney disease   End stage renal disease   Peripheral vascular disease, unspecified   Diabetes mellitus   Toe gangrene   Wound infection after surgery   Fever, unspecified   Diarrhea    Time spent: 35 minutes    Eugenie Filler M.D. [facility] Pager [phone]. If 7PM-7AM, please contact night-coverage at [email or web address], password [facility] 04/23/2013, 2:30 PM  LOS: 7 days

## 2013-04-23 NOTE — Procedures (Signed)
I was present at this dialysis session, have reviewed the session itself and made  appropriate changes  [name] MD (pgr) [phone]    [phone] 04/23/2013, 4:08 PM

## 2013-04-23 NOTE — Progress Notes (Signed)
Orthopedic Tech Progress Note Patient Details:  Jack [name] 12-28-1945 [id number]  Ortho Devices Type of Ortho Device: Post (short leg) splint Ortho Device/Splint Interventions: Application   Theodoro Parma Cammer 04/23/2013, 9:54 AM

## 2013-04-23 NOTE — Consult Note (Addendum)
WOC wound consult note Reason for Consult: Ortho service following for assessment and plan of care to foot wounds. Consult requested for buttocks assessment.  Pt had an echocardiogram performed on 4/8 with sedation and then an endoscopy afterwards with further sedation that same day.  They also had an unresponsive episode after the endoscopy.  Deep tissue injury was noted by the floor nurse on 4/8 upon return to the nursing unit that afternoon. Pt is frequently incontinent of stool and area is difficult to keep clean and dry. Wound type: Deep tissue injury to inner buttocks Pressure Ulcer POA: No Measurement:5X4cm Wound bed: Darker-colored skin tone Drainage (amount, consistency, odor) No open wound, odor or drainage. Periwound: Intact skin surrounding, but pt is very soiled and did not realize he had been incontinent of stool upon assessment. Dressing procedure/placement/frequency: Barrier cream to protect, turn Q 2 hours to reduce pressure to affected area. Family member at bedside to assess site and discuss pressure ulcer etiology, topical treatment, and preventive measures with patient. Wound is high risk to evolve into full thickness skin loss.  Julien Girt MSN, RN, [place], [place], [place]

## 2013-04-23 NOTE — Progress Notes (Signed)
Patient ID: BRITTON BERA, male   DOB: 08/15/1945, 68 y.o.   MRN: [id number] Vascular Surgery Progress Note  Subjective: 1 day post I&D left first toe amputation site. Patient denies increased pain. Remaining afebrile  Objective:  Filed Vitals:   04/23/13 1412  BP:   Pulse:   Temp: 98.2 F (36.8 C)  Resp:     General alert and oriented x3 Dressing intact left foot   Labs:  Recent Labs Lab 04/21/13 1608 04/22/13 0725 04/23/13 1156  CREATININE 7.84* 5.75* 7.63*    Recent Labs Lab 04/21/13 1608 04/22/13 0725 04/23/13 1156  NA 138 138 137  K 3.3* 3.8 3.2*  CL 95* 93* 93*  CO2 25 23 25   BUN 44* 28* 38*  CREATININE 7.84* 5.75* 7.63*  GLUCOSE 117* 128* 174*  CALCIUM 9.2 9.7 9.5    Recent Labs Lab 04/21/13 1608 04/22/13 0725 04/23/13 1156  WBC 10.8* 12.0* 12.9*  HGB 10.9* 12.1* 11.0*  HCT 34.5* 39.1 35.0*  PLT 241 240 256    Recent Labs Lab 04/17/13 0550 04/22/13 0725  INR 1.31 1.11    I/O last 3 completed shifts: In: 300 [I.V.:300] Out: -   Imaging: No results found.  Assessment/Plan:  POD #1   LOS: 7 days  s/p Procedure(s): IRRIGATION AND DEBRIDEMENT LEFT FIRST TOE AMPUTATION WOUND   Appreciate Dr. Jess Barters help. He plans possible transmetatarsal amputation on Monday. With the patient in hospital on IV antibiotics and wound care   [name], MD 04/23/2013 2:30 PM

## 2013-04-23 NOTE — Progress Notes (Addendum)
Subjective:   No complaints but is having loose BMs for a few days. No pain, appetite ok.  Objective Filed Vitals:   04/22/13 1300 04/22/13 1700 04/22/13 2059 04/23/13 0514  BP: 129/71 132/51 150/76 156/69  Pulse: 75 68 68 64  Temp: 99.2 F (37.3 C) 99.4 F (37.4 C) 99.2 F (37.3 C) 98.1 F (36.7 C)  TempSrc: Oral Oral Oral Oral  Resp:  18 18 18   Height:   5\' 10"  (1.778 m)   Weight:   81.6 kg (179 lb 14.3 oz)   SpO2: 99% 100% 97% 97%   Physical Exam General: Alert and oriented. No acute distress. Heart: RRR 2/6 systolic murmur Lungs:CTA, unlabored Abdomen: soft, nontender. +BS Extremities: No edema.  Healed R TMA. L foot with dressing dry and intact Dialysis Access: AVF @ LUA with + bruit  Dialysis: [facility]  4h 400/800 85.5kg Heparin 8700 LUA AVF  Hectorol 1 EPO none    Assessment/Plan:   1. Wound infection left foot / recent L great toe amp 3-25 / MRSA bacteremia - s/p irrigation & debridement 4/9 per Dr. Kellie Simmering, wound culture gram + cocci and rods, on Vancomycin per pharmcy, TEE 4/7 showed moderate LVH, EF 65%, no vegetation.  2. ESRD - HD on MWF @ [place], K 3.8. HD pending today.  3. HTN/Volume - BP 156/69 on Doxazosin 2 mg qhs, Bystolic 5 mg qhs; below EDW, no signs or symptoms of fluid overload. 4. Anemia - Hgb 12.1, stable. No ESA 5. Sec HPT - Ca 9.1 (10.5 corrected), Hectorol 1 mcg, Phos 3.6 no binders, 2Ca bath.  6. Nutrition - Alb 2.2, carb-mod renal diet, multivitamin, supplement. Encourage protien 7. DM - insulin per primary. 8. Diarrhea- check cdiff   Shelle Iron, NP [place] [phone] 04/23/2013,8:42 AM  LOS: 7 days   Pt seen, examined and agree w A/P as above.  [name] MD pager [phone]    cell [phone] 04/23/2013, 4:20 PM    Additional Objective Labs: Basic Metabolic Panel:  Recent Labs Lab 04/19/13 1501 04/21/13 1608 04/22/13 0725  NA 141 138 138  K 4.0 3.3* 3.8  CL 92* 95* 93*  CO2 24 25 23   GLUCOSE  135* 117* 128*  BUN 54* 44* 28*  CREATININE 9.47* 7.84* 5.75*  CALCIUM 9.1 9.2 9.7  PHOS 5.7* 3.6  --    Liver Function Tests:  Recent Labs Lab 04/19/13 1501 04/21/13 1608  ALBUMIN 2.2* 2.2*   No results found for this basename: LIPASE, AMYLASE,  in the last 168 hours CBC:  Recent Labs Lab 04/16/13 2017 04/17/13 0550 04/19/13 1310 04/19/13 1501 04/21/13 1608 04/22/13 0725  WBC 14.0* 14.8*  14.7* 10.9* 10.2 10.8* 12.0*  NEUTROABS 12.3*  --  8.0*  --   --   --   HGB 11.0* 11.2*  11.1* 9.8* 10.4* 10.9* 12.1*  HCT 35.4* 36.2*  36.2* 31.4* 33.0* 34.5* 39.1  MCV 86.8 86.6  86.8 85.3 85.3 83.7 86.5  PLT 197 207  217 210 189 241 240   Blood Culture    Component Value Date/Time   SDES WOUND LEFT FOOT 04/22/2013 1037   SPECREQUEST LEFT FIRST TOE PT ON VANCO VANCO 04/22/2013 1037   CULT  Value: Culture reincubated for better growth Performed at [facility] 04/22/2013 1037   REPTSTATUS PENDING 04/22/2013 1037    Cardiac Enzymes: No results found for this basename: CKTOTAL, CKMB, CKMBINDEX, TROPONINI,  in the last 168 hours CBG:  Recent Labs Lab  04/22/13 0809 04/22/13 1104 04/22/13 1712 04/22/13 2126 04/23/13 0823  GLUCAP 135* 134* 173* 208* 131*   Iron Studies: No results found for this basename: IRON, TIBC, TRANSFERRIN, FERRITIN,  in the last 72 hours @lablastinr3 @ Studies/Results: No results found. Medications: . sodium chloride 20 mL/hr at 04/22/13 1002   . colchicine  0.6 mg Oral Daily  . doxazosin  2 mg Oral QHS  . doxercalciferol  1 mcg Intravenous Q M,W,F-HD  . feeding supplement (NEPRO CARB STEADY)  237 mL Oral BID BM  . heparin  5,000 Units Subcutaneous 3 times per day  . insulin aspart  0-5 Units Subcutaneous QHS  . insulin aspart  0-9 Units Subcutaneous TID WC  . levothyroxine  175 mcg Oral QAC breakfast  . multivitamin  1 tablet Oral QHS  . nebivolol  5 mg Oral QHS  . silver sulfADIAZINE   Topical Daily  . sodium chloride  3 mL Intravenous  Q12H  . vancomycin  1,000 mg Intravenous Q M,W,F-HD

## 2013-04-23 NOTE — Progress Notes (Signed)
[facility]     Subjective: No new complaints   Antibiotics:  Anti-infectives   Start     Dose/Rate Route Frequency Ordered Stop   04/22/13 0600  [MAR Hold]  cefUROXime (ZINACEF) 1.5 g in dextrose 5 % 50 mL IVPB     (On MAR Hold since 04/22/13 0904)   1.5 g 100 mL/hr over 30 Minutes Intravenous On call to O.R. 04/21/13 1626 04/22/13 1015   04/19/13 1345  nafcillin 2 g in dextrose 5 % 50 mL IVPB  Status:  Discontinued     2 g 100 mL/hr over 30 Minutes Intravenous 6 times per day 04/19/13 1313 04/20/13 1518   04/19/13 1200  vancomycin (VANCOCIN) IVPB 1000 mg/200 mL premix     1,000 mg 200 mL/hr over 60 Minutes Intravenous Every M-W-F (Hemodialysis) 04/17/13 0245     04/17/13 0200  vancomycin (VANCOCIN) 2,000 mg in sodium chloride 0.9 % 500 mL IVPB     2,000 mg 250 mL/hr over 120 Minutes Intravenous  Once 04/17/13 0154 04/17/13 0627   04/17/13 0200  piperacillin-tazobactam (ZOSYN) IVPB 2.25 g  Status:  Discontinued     2.25 g 100 mL/hr over 30 Minutes Intravenous 3 times per day 04/17/13 0154 04/19/13 1312      Medications: Scheduled Meds: . colchicine  0.6 mg Oral Daily  . doxazosin  2 mg Oral QHS  . doxercalciferol  1 mcg Intravenous Q M,W,F-HD  . feeding supplement (NEPRO CARB STEADY)  237 mL Oral BID BM  . heparin  5,000 Units Subcutaneous 3 times per day  . insulin aspart  0-5 Units Subcutaneous QHS  . insulin aspart  0-9 Units Subcutaneous TID WC  . levothyroxine  175 mcg Oral QAC breakfast  . multivitamin  1 tablet Oral QHS  . nebivolol  5 mg Oral QHS  . silver sulfADIAZINE   Topical Daily  . sodium chloride  3 mL Intravenous Q12H  . vancomycin  1,000 mg Intravenous Q M,W,F-HD   Continuous Infusions: . sodium chloride 20 mL/hr at 04/22/13 1002   PRN Meds:.sodium chloride, sodium chloride, sodium chloride, acetaminophen, acetaminophen, feeding supplement (NEPRO CARB STEADY), fentaNYL, heparin, heparin, HYDROmorphone (DILAUDID)  injection, lidocaine (PF), lidocaine-prilocaine, midazolam, ondansetron (ZOFRAN) IV, ondansetron, oxyCODONE, pentafluoroprop-tetrafluoroeth, sodium chloride    Objective: Weight change: -5 lb 8.2 oz (-2.5 kg)  Intake/Output Summary (Last 24 hours) at 04/23/13 1928 Last data filed at 04/23/13 1802  Gross per 24 hour  Intake    240 ml  Output   2350 ml  Net  -2110 ml   Blood pressure 140/65, pulse 65, temperature 98.2 F (36.8 C), temperature source Oral, resp. rate 18, height '5\' 10"'  (1.778 m), weight 170 lb 13.7 oz (77.5 kg), SpO2 100.00%. Temp:  [98.1 F (36.7 C)-99.2 F (37.3 C)] 98.2 F (36.8 C) (04/10 1802) Pulse Rate:  [55-75] 65 (04/10 1802) Resp:  [18] 18 (04/10 1802) BP: (119-156)/(57-76) 140/65 mmHg (04/10 1802) SpO2:  [96 %-100 %] 100 % (04/10 1802) Weight:  [170 lb 13.7 oz (77.5 kg)-179 lb 14.3 oz (81.6 kg)] 170 lb 13.7 oz (77.5 kg) (04/10 1802)  Physical Exam: General: Alert and awake, oriented x3, not in any acute distress.  HEENT: anicteric sclera, pupils reactive to light and accommodation, EOMI, oropharynx clear and without exudate  CVS regular rate, normal r, faint murmur LLSB  Chest: clear to auscultation bilaterally, no wheezing, rales or rhonchi  Abdomen: Hyperactive bowel sounds soft nontender, nondistended, normal bowel sounds,  Extremities: Skin: see pictures:  Amputation site with darkening skin, sutures in place:  See sequential pictures from my last note  Wound bandaged      CBC:   Recent Labs  04/22/13 0725 04/23/13 1156  WBC 12.0* 12.9*  HGB 12.1* 11.0*  HCT 39.1 35.0*  NA 138 137  K 3.8 3.2*  CL 93* 93*  CO2 23 25  BUN 28* 38*  CREATININE 5.75* 7.63*    BMET  Recent Labs  04/22/13 0725 04/23/13 1156  NA 138 137  K 3.8 3.2*  CL 93* 93*  CO2 23 25  GLUCOSE 128* 174*  BUN 28* 38*  CREATININE 5.75* 7.63*  CALCIUM 9.7 9.5     Liver Panel   Recent Labs  04/21/13 1608 04/23/13 1156  ALBUMIN 2.2* 2.2*        Sedimentation Rate No results found for this basename: ESRSEDRATE,  in the last 72 hours C-Reactive Protein No results found for this basename: CRP,  in the last 72 hours  Micro Results: Recent Results (from the past 240 hour(s))  SURGICAL PCR SCREEN     Status: None   Collection Time    04/17/13  5:10 AM      Result Value Ref Range Status   MRSA, PCR NEGATIVE  NEGATIVE Final   Staphylococcus aureus NEGATIVE  NEGATIVE Final   Comment:            The Xpert SA Assay (FDA     approved for NASAL specimens     in patients over 34 years of age),     is one component of     a comprehensive surveillance     program.  Test performance has     been validated by [employer] for patients greater     than or equal to 85 year old.     It is not intended     to diagnose infection nor to     guide or monitor treatment.  CULTURE, BLOOD (ROUTINE X 2)     Status: None   Collection Time    04/17/13  5:48 AM      Result Value Ref Range Status   Specimen Description BLOOD RIGHT HAND   Final   Special Requests BOTTLES DRAWN AEROBIC ONLY 5CC   Final   Culture  Setup Time     Final   Value: 04/17/2013 12:44     Performed at [facility]   Culture     Final   Value: METHICILLIN RESISTANT STAPHYLOCOCCUS AUREUS     Note: RIFAMPIN AND GENTAMICIN SHOULD NOT BE USED AS SINGLE DRUGS FOR TREATMENT OF STAPH INFECTIONS. This organism DOES NOT demonstrate inducible Clindamycin resistance in vitro. CRITICAL RESULT CALLED TO, READ BACK BY AND VERIFIED WITH: JAMIE COVINGTON      04/19/13 1437 BY SMITHERSJ     Note: Gram Stain Report Called to,Read Back By and Verified With: Virgilio Frees 04/18/12 @ 10:45AM BY RUSCOE A.     Performed at [facility]   Report Status 04/20/2013 FINAL   Final   Organism ID, Bacteria METHICILLIN RESISTANT STAPHYLOCOCCUS AUREUS   Final  CULTURE, BLOOD (ROUTINE X 2)     Status: None   Collection Time    04/17/13  5:50 AM      Result Value Ref Range Status    Specimen Description BLOOD RIGHT HAND   Final   Special Requests     Final  Value: BOTTLES DRAWN AEROBIC AND ANAEROBIC 10CC BLUE,5CC RED   Culture  Setup Time     Final   Value: 04/17/2013 12:44     Performed at [facility]   Culture     Final   Value: STAPHYLOCOCCUS AUREUS     Note: SUSCEPTIBILITIES PERFORMED ON PREVIOUS CULTURE WITHIN THE LAST 5 DAYS.     Note: Gram Stain Report Called to,Read Back By and Verified With: Earleen Reaper RN on 04/18/13 at 05:50 by Rise Mu     Performed at [facility]   Report Status 04/20/2013 FINAL   Final  CULTURE, BLOOD (ROUTINE X 2)     Status: None   Collection Time    04/19/13  1:04 PM      Result Value Ref Range Status   Specimen Description BLOOD RIGHT HAND   Final   Special Requests BOTTLES DRAWN AEROBIC ONLY [facility]   Final   Culture  Setup Time     Final   Value: 04/19/2013 16:06     Performed at [facility]   Culture     Final   Value:        BLOOD CULTURE RECEIVED NO GROWTH TO DATE CULTURE WILL BE HELD FOR 5 DAYS BEFORE ISSUING A FINAL NEGATIVE REPORT     Performed at [facility]   Report Status PENDING   Incomplete  CULTURE, BLOOD (ROUTINE X 2)     Status: None   Collection Time    04/19/13  1:10 PM      Result Value Ref Range Status   Specimen Description BLOOD RIGHT ANTECUBITAL   Final   Special Requests BOTTLES DRAWN AEROBIC ONLY 5CC   Final   Culture  Setup Time     Final   Value: 04/19/2013 16:06     Performed at [facility]   Culture     Final   Value:        BLOOD CULTURE RECEIVED NO GROWTH TO DATE CULTURE WILL BE HELD FOR 5 DAYS BEFORE ISSUING A FINAL NEGATIVE REPORT     Performed at [facility]   Report Status PENDING   Incomplete  WOUND CULTURE     Status: None   Collection Time    04/22/13 10:37 AM      Result Value Ref Range Status   Specimen Description WOUND LEFT FOOT   Final   Special Requests LEFT FIRST TOE PT ON VANCO VANCO   Final   Gram Stain     Final    Value: FEW WBC PRESENT, PREDOMINANTLY PMN     FEW SQUAMOUS EPITHELIAL CELLS PRESENT     MODERATE GRAM POSITIVE COCCI IN CLUSTERS     MODERATE GRAM POSITIVE RODS     Performed at [facility]     Final   Value: Culture reincubated for better growth     Performed at [facility]   Report Status PENDING   Incomplete    Studies/Results: No results found.    Assessment/Plan:  Principal Problem:   MRSA bacteremia Active Problems:   Essential hypertension, benign   Diabetes mellitus type 1, controlled, with complications   Anemia in chronic kidney disease   End stage renal disease   Peripheral vascular disease, unspecified   Diabetes mellitus   Toe gangrene   Wound infection after surgery   Fever, unspecified   Diarrhea    Jack [name] is a 68 y.o. male  with  Multiple myeloma, DM, ESRD on HD via AV fistula with gangrrene in right foot sp 1st amputation readmitted for infection at that site on abx and observation but NOW WITH MRSA bacteremia.  #1  [facility] Staphylococcus aureus bacteremia   Staphylococcus aureus bacteremia (SAB) is associated with a high rate of complications and mortality. Specific aspects of clinical management are critical to optimizing the outcome of patients with SAB. Therefore, the [facility] Antimicrobial Management Team [facility]) has initiated an intervention aimed at improving the management of SAB at [facility]. To do so, Infectious Diseases physicians are providing an evidence-based consult for the management of all patients with SAB.      Yes  No  Comments   Perform follow-up blood cultures (even if the patient is afebrile) to ensure clearance of bacteremia  '[X]'   '[ ]'   Performed on 04/19/13 NGTD  Remove vascular catheter and obtain follow-up blood cultures after the removal of the catheter  '[ ]'   '[ ]'   No line in place today the fistula appears clean   Perform echocardiography to evaluate  for endocarditis (transthoracic ECHO is 40-50% sensitive, TEE is > 90% sensitive)  '[X]'   '[ ]'   TEE without vegetations  Consult electrophysiologist to evaluate implanted cardiac device (pacemaker, ICD)  '[ ]'   '[ ]'   NA   Ensure source control  '[ ]'   '[X]'   Have all abscesses been drained effectively?  Have deep seeded infections (septic joints or osteomyelitis) had appropriate surgical debridement?   He underwent I and D today with cultures sent  Dr Sharol Given to likely perfrom TMA on Monday   Investigate for "metastatic" sites of infection  '[X]'   '[ ]'   Does the patient have ANY symptom or physical exam finding that would suggest a deeper infection (back or neck pain that may be suggestive of vertebral osteomyelitis or epidural abscess, muscle pain that could be a symptom of pyomyositis)?  Keep in mind that for deep seeded infections MRI imaging with contrast is preferred rather than other often insensitive tests such as plain x-rays, especially early in a patient's presentation.  One could consider MRI brain but his TEE was clean. I DONT FEEL  Strong need for this study     Change antibiotic therapy to  vancomycin  '[X]'   '[ ]'   Beta-lactam antibiotics are preferred for MSSA due to higher cure rates.  If on Vancomycin, goal trough should be 15 - 20 mcg/mL   Estimated duration of IV antibiotic therapy: 8 WEEKS  '[X]'   '[ ]'   Consult case management for probably prolonged outpatient IV antibiotic therapy    #2 Amputation site: see above, VVS took to OR followup cultures and he will likely have TMA on Monday  Dr. Baxter Flattery will be covering this weekend and is available for questions.      LOS: 7 days   Truman Hayward 04/23/2013, 7:28 PM

## 2013-04-23 NOTE — Progress Notes (Signed)
ANTIBIOTIC CONSULT NOTE - FOLLOW UP  Pharmacy Consult for Vancomycin Indication: MRSA bacteremia  Allergies  Allergen Reactions  . Lisinopril Cough  . Ivp Dye [Iodinated Diagnostic Agents] Hives  . Morphine And Related Other (See Comments)    Bradycardia states patient    Patient Measurements: Height: 5\' 10"  (177.8 cm) Weight: 176 lb 2.4 oz (79.9 kg) IBW/kg (Calculated) : 73  Vital Signs: Temp: 98.2 F (36.8 C) (04/10 1412) Temp src: Oral (04/10 1412) BP: 137/70 mmHg (04/10 1630) Pulse Rate: 70 (04/10 1630) Intake/Output from previous day: 04/09 0701 - 04/10 0700 In: 300 [I.V.:300] Out: -  Intake/Output from this shift: Total I/O In: 240 [P.O.:240] Out: -   Labs:  Recent Labs  04/21/13 1608 04/22/13 0725 04/23/13 1156  WBC 10.8* 12.0* 12.9*  HGB 10.9* 12.1* 11.0*  PLT 241 240 256  CREATININE 7.84* 5.75* 7.63*   Estimated Creatinine Clearance: 9.4 ml/min (by C-G formula based on Cr of 7.63).  Recent Labs  04/23/13 1430  VANCORANDOM 17.1     Microbiology: Recent Results (from the past 720 hour(s))  SURGICAL PCR SCREEN     Status: None   Collection Time    04/17/13  5:10 AM      Result Value Ref Range Status   MRSA, PCR NEGATIVE  NEGATIVE Final   Staphylococcus aureus NEGATIVE  NEGATIVE Final   Comment:            The Xpert SA Assay (FDA     approved for NASAL specimens     in patients over 82 years of age),     is one component of     a comprehensive surveillance     program.  Test performance has     been validated by [employer] for patients greater     than or equal to 81 year old.     It is not intended     to diagnose infection nor to     guide or monitor treatment.  CULTURE, BLOOD (ROUTINE X 2)     Status: None   Collection Time    04/17/13  5:48 AM      Result Value Ref Range Status   Specimen Description BLOOD RIGHT HAND   Final   Special Requests BOTTLES DRAWN AEROBIC ONLY 5CC   Final   Culture  Setup Time     Final   Value:  04/17/2013 12:44     Performed at [facility]   Culture     Final   Value: METHICILLIN RESISTANT STAPHYLOCOCCUS AUREUS     Note: RIFAMPIN AND GENTAMICIN SHOULD NOT BE USED AS SINGLE DRUGS FOR TREATMENT OF STAPH INFECTIONS. This organism DOES NOT demonstrate inducible Clindamycin resistance in vitro. CRITICAL RESULT CALLED TO, READ BACK BY AND VERIFIED WITH: JAMIE COVINGTON      04/19/13 1437 BY SMITHERSJ     Note: Gram Stain Report Called to,Read Back By and Verified With: Virgilio Frees 04/18/12 @ 10:45AM BY RUSCOE A.     Performed at [facility]   Report Status 04/20/2013 FINAL   Final   Organism ID, Bacteria METHICILLIN RESISTANT STAPHYLOCOCCUS AUREUS   Final  CULTURE, BLOOD (ROUTINE X 2)     Status: None   Collection Time    04/17/13  5:50 AM      Result Value Ref Range Status   Specimen Description BLOOD RIGHT HAND   Final   Special Requests     Final  Value: BOTTLES DRAWN AEROBIC AND ANAEROBIC 10CC BLUE,5CC RED   Culture  Setup Time     Final   Value: 04/17/2013 12:44     Performed at [facility]   Culture     Final   Value: STAPHYLOCOCCUS AUREUS     Note: SUSCEPTIBILITIES PERFORMED ON PREVIOUS CULTURE WITHIN THE LAST 5 DAYS.     Note: Gram Stain Report Called to,Read Back By and Verified With: Earleen Reaper RN on 04/18/13 at 05:50 by Rise Mu     Performed at [facility]   Report Status 04/20/2013 FINAL   Final  CULTURE, BLOOD (ROUTINE X 2)     Status: None   Collection Time    04/19/13  1:04 PM      Result Value Ref Range Status   Specimen Description BLOOD RIGHT HAND   Final   Special Requests BOTTLES DRAWN AEROBIC ONLY [facility]   Final   Culture  Setup Time     Final   Value: 04/19/2013 16:06     Performed at [facility]   Culture     Final   Value:        BLOOD CULTURE RECEIVED NO GROWTH TO DATE CULTURE WILL BE HELD FOR 5 DAYS BEFORE ISSUING A FINAL NEGATIVE REPORT     Performed at [facility]   Report Status PENDING    Incomplete  CULTURE, BLOOD (ROUTINE X 2)     Status: None   Collection Time    04/19/13  1:10 PM      Result Value Ref Range Status   Specimen Description BLOOD RIGHT ANTECUBITAL   Final   Special Requests BOTTLES DRAWN AEROBIC ONLY 5CC   Final   Culture  Setup Time     Final   Value: 04/19/2013 16:06     Performed at [facility]   Culture     Final   Value:        BLOOD CULTURE RECEIVED NO GROWTH TO DATE CULTURE WILL BE HELD FOR 5 DAYS BEFORE ISSUING A FINAL NEGATIVE REPORT     Performed at [facility]   Report Status PENDING   Incomplete  WOUND CULTURE     Status: None   Collection Time    04/22/13 10:37 AM      Result Value Ref Range Status   Specimen Description WOUND LEFT FOOT   Final   Special Requests LEFT FIRST TOE PT ON VANCO VANCO   Final   Gram Stain     Final   Value: FEW WBC PRESENT, PREDOMINANTLY PMN     FEW SQUAMOUS EPITHELIAL CELLS PRESENT     MODERATE GRAM POSITIVE COCCI IN CLUSTERS     MODERATE GRAM POSITIVE RODS     Performed at [facility]     Final   Value: Culture reincubated for better growth     Performed at [facility]   Report Status PENDING   Incomplete    Anti-infectives   Start     Dose/Rate Route Frequency Ordered Stop   04/22/13 0600  [MAR Hold]  cefUROXime (ZINACEF) 1.5 g in dextrose 5 % 50 mL IVPB     (On MAR Hold since 04/22/13 0904)   1.5 g 100 mL/hr over 30 Minutes Intravenous On call to O.R. 04/21/13 1626 04/22/13 1015   04/19/13 1345  nafcillin 2 g in dextrose 5 % 50 mL IVPB  Status:  Discontinued  2 g 100 mL/hr over 30 Minutes Intravenous 6 times per day 04/19/13 1313 04/20/13 1518   04/19/13 1200  vancomycin (VANCOCIN) IVPB 1000 mg/200 mL premix     1,000 mg 200 mL/hr over 60 Minutes Intravenous Every M-W-F (Hemodialysis) 04/17/13 0245     04/17/13 0200  vancomycin (VANCOCIN) 2,000 mg in sodium chloride 0.9 % 500 mL IVPB     2,000 mg 250 mL/hr over 120 Minutes Intravenous  Once  04/17/13 0154 04/17/13 0627   04/17/13 0200  piperacillin-tazobactam (ZOSYN) IVPB 2.25 g  Status:  Discontinued     2.25 g 100 mL/hr over 30 Minutes Intravenous 3 times per day 04/17/13 0154 04/19/13 1312      Assessment: 68 y.o. M with ESRD who continues on Vancomycin for MRSA bacteremia thought to be due to a L-foot infection, s/p I&D on 4/9. TEE negative for IE. ID on board and want to treat with Vancomycin for 8 weeks.  A pre-HD Vancomycin level came back therapeutic this afternoon (Vanc level 17.1 mcg/mL, goal of 15-25 mcg/ml in HD patients). Tolerating HD well this afternoon, dose remains appropriate.   Goal of Therapy:  Pre-HD Vancomycin level of 15-25 mcg/ml  Plan:  1. Continue Vancomcyin 1g IV post HD sessions on MWF 2. Will continue to follow HD schedule/duration for appropriateness of Vancomycin doses  Alycia Rossetti, PharmD, BCPS Clinical Pharmacist Pager: [phone] 04/23/2013 4:47 PM

## 2013-04-23 NOTE — Progress Notes (Signed)
   CARE MANAGEMENT NOTE 04/23/2013  Patient:  Jack [name], Jack [name]   Account Number:  [id number]  Date Initiated:  04/19/2013  Documentation initiated by:  Lizabeth Leyden  Subjective/Objective Assessment:   admitted with fever, redness, pain at site of toe amputation on 04/07/2013  medical hx: ESRD/HD, DM     Action/Plan:   progression of care and discharge planning  active with [facility] HHPT   Anticipated DC Date:  04/23/2013   Anticipated DC Plan:  [place]  CM consult      [facility] Choice  Resumption Of Svcs/PTA Provider   Choice offered to / List presented to:             Status of service:  In process, will continue to follow Medicare Important Message given?   (If response is "NO", the following Medicare IM given date fields will be blank) Date Medicare IM given:   Date Additional Medicare IM given:    Discharge Disposition:    Per UR Regulation:    If discussed at Long Length of Stay Meetings, dates discussed:    Comments:  04/23/2013  [place], [place] Met with patient regarding discharge planning, home health services. He is currently receiving HHPT with [place] and wishes to continue. He does not have a BSC, he has a wc but it is too big and his therapist is working on getting a smaller one.  [facility]/[place] called to update on possible discharge needs.

## 2013-04-23 NOTE — Evaluation (Signed)
Physical Therapy Evaluation Patient Details Name: Jack [name] MRN: [id number] DOB: 02-Oct-1945 Today's Date: 04/23/2013   History of Present Illness  Patient is a 68 year old gentleman end stage renal disease diabetes severe peripheral vascular disease who is status post limb salvage surgery with amputation of the left great toe.  Clinical Impression  Pt s/p Lt great toe amputation. Presents with decreased independence with mobility and transfers due to pain and weakness. Will benefit from skilled acute PT to maximize functional mobility and independence with transfers prior to returning home with family.     Follow Up Recommendations Home health PT;Supervision/Assistance - 24 hour    Equipment Recommendations  3in1 (PT)    Recommendations for Other Services OT consult     Precautions / Restrictions Precautions Precautions: Fall Required Braces or Orthoses: Other Brace/Splint Other Brace/Splint: darco shoe Lt LE  Restrictions Weight Bearing Restrictions: Yes Other Position/Activity Restrictions: waiting clarification from MD       Mobility  Bed Mobility Overal bed mobility: Needs Assistance Bed Mobility: Supine to Sit     Supine to sit: Min assist;HOB elevated     General bed mobility comments: pt self limiting and asking for (A) prior to attempting himself; max cues for hand placement and sequencing; pt able to advance LEs with incr time and required (A) to elevate trunk  Transfers Overall transfer level: Needs assistance Equipment used: Rolling walker (2 wheeled) Transfers: Sit to/from [facility] Sit to Stand: Min assist Stand pivot transfers: Mod assist;From elevated surface       General transfer comment: cues for hand placement and sequencing with RW; cues for proper wb'ing through [place] only; (A) to maintain balance and facilitate pivotal steps to chair; demo decr safety awareness when controlling descent to chair  Ambulation/Gait                Stairs            Wheelchair Mobility    Modified Rankin (Stroke Patients Only)       Balance Overall balance assessment: Needs assistance Sitting-balance support: No upper extremity supported;Feet supported Sitting balance-Leahy Scale: Good     Standing balance support: During functional activity;Bilateral upper extremity supported Standing balance-Leahy Scale: Poor Standing balance comment: requries (A) and bil UE supported by RW                             Pertinent Vitals/Pain 5/10; premedicated by RN and patient repositioned for comfort     Home Living Family/patient expects to be discharged to:: Private residence Living Arrangements: Children Available Help at Discharge: Family;Available 24 hours/day Type of Home: Apartment Home Access: Level entry     Home Layout: One level Home Equipment: Walker - 2 wheels;Cane - single point;Shower seat;Wheelchair - manual Additional Comments: having difficulty transferring from low toilet seat recently     Prior Function Level of Independence: Independent with assistive device(s)         Comments: pt was ambulating with RW unless pain was too bad; primarily in wheelchair recently due to pain; could transfer to wheelchair independently      Hand Dominance   Dominant Hand: Right    Extremity/Trunk Assessment   Upper Extremity Assessment: Defer to OT evaluation           Lower Extremity Assessment: LLE deficits/detail   LLE Deficits / Details: unable to assess ankle/foot due to surgery; hip grossly 3+/5; quad 3+/5  Cervical /  Trunk Assessment: Kyphotic  Communication   Communication: No difficulties  Cognition Arousal/Alertness: Awake/alert Behavior During Therapy: Flat affect Overall Cognitive Status: Within Functional Limits for tasks assessed                      General Comments General comments (skin integrity, edema, etc.): ACE bandage on Lt LE; previous Rt  forefoot amputation ; encouraged LE exercises with pt and daughter    Exercises        Assessment/Plan    PT Assessment Patient needs continued PT services  PT Diagnosis Difficulty walking;Generalized weakness;Acute pain   PT Problem List Decreased strength;Decreased range of motion;Decreased activity tolerance;Decreased balance;Decreased mobility;Decreased cognition;Decreased knowledge of use of DME;Decreased safety awareness;Decreased skin integrity;Impaired sensation;Decreased knowledge of precautions  PT Treatment Interventions DME instruction;Gait training;Functional mobility training;Therapeutic exercise;Therapeutic activities;Balance training;Neuromuscular re-education;Patient/family education   PT Goals (Current goals can be found in the Care Plan section) Acute Rehab PT Goals Patient Stated Goal: to get more independent PT Goal Formulation: With patient Time For Goal Achievement: 05/07/13 Potential to Achieve Goals: Good    Frequency Min 3X/week   Barriers to discharge        Co-evaluation               End of Session Equipment Utilized During Treatment: Gait belt Activity Tolerance: Patient tolerated treatment well Patient left: in chair;with call bell/phone within reach;with family/visitor present;with nursing/sitter in room Nurse Communication: Mobility status;Precautions;Weight bearing status         Time: 0175-1025 PT Time Calculation (min): 17 min   Charges:   PT Evaluation $Initial PT Evaluation Tier I: 1 Procedure PT Treatments $Therapeutic Activity: 8-22 mins   PT G CodesKennis Carina [place], [place] [phone] 04/23/2013, 11:24 AM

## 2013-04-24 LAB — CBC
HEMATOCRIT: 35.8 % — AB (ref 39.0–52.0)
Hemoglobin: 11.2 g/dL — ABNORMAL LOW (ref 13.0–17.0)
MCH: 26.6 pg (ref 26.0–34.0)
MCHC: 31.3 g/dL (ref 30.0–36.0)
MCV: 85 fL (ref 78.0–100.0)
Platelets: 266 10*3/uL (ref 150–400)
RBC: 4.21 MIL/uL — ABNORMAL LOW (ref 4.22–5.81)
RDW: 14.8 % (ref 11.5–15.5)
WBC: 13.6 10*3/uL — AB (ref 4.0–10.5)

## 2013-04-24 LAB — GLUCOSE, CAPILLARY
GLUCOSE-CAPILLARY: 131 mg/dL — AB (ref 70–99)
Glucose-Capillary: 141 mg/dL — ABNORMAL HIGH (ref 70–99)
Glucose-Capillary: 155 mg/dL — ABNORMAL HIGH (ref 70–99)
Glucose-Capillary: 111 mg/dL — ABNORMAL HIGH (ref 70–99)

## 2013-04-24 LAB — RENAL FUNCTION PANEL
Albumin: 2.5 g/dL — ABNORMAL LOW (ref 3.5–5.2)
BUN: 19 mg/dL (ref 6–23)
CO2: 27 mEq/L (ref 19–32)
Calcium: 9.7 mg/dL (ref 8.4–10.5)
Chloride: 94 mEq/L — ABNORMAL LOW (ref 96–112)
Creatinine, Ser: 5.41 mg/dL — ABNORMAL HIGH (ref 0.50–1.35)
GFR calc Af Amer: 11 mL/min — ABNORMAL LOW (ref >90)
GFR calc non Af Amer: 10 mL/min — ABNORMAL LOW (ref >90)
GLUCOSE: 146 mg/dL — AB (ref 70–99)
POTASSIUM: 3.6 meq/L — AB (ref 3.7–5.3)
Phosphorus: 4.1 mg/dL (ref 2.3–4.6)
Sodium: 139 mEq/L (ref 137–147)

## 2013-04-24 LAB — CLOSTRIDIUM DIFFICILE BY PCR: CDIFFPCR: NEGATIVE

## 2013-04-24 MED ORDER — LOPERAMIDE HCL 2 MG PO CAPS
4.0000 mg | ORAL_CAPSULE | Freq: Once | ORAL | Status: AC
  Administered 2013-04-24: 4 mg via ORAL
  Filled 2013-04-24: qty 2

## 2013-04-24 MED ORDER — COLCHICINE 0.6 MG PO TABS
0.3000 mg | ORAL_TABLET | Freq: Every day | ORAL | Status: DC
  Administered 2013-04-25 – 2013-04-30 (×6): 0.3 mg via ORAL
  Filled 2013-04-24 (×6): qty 0.5

## 2013-04-24 MED ORDER — LOPERAMIDE HCL 2 MG PO CAPS
2.0000 mg | ORAL_CAPSULE | ORAL | Status: DC | PRN
  Filled 2013-04-24: qty 1

## 2013-04-24 NOTE — Progress Notes (Signed)
Subjective:   Doing ok, may need further amputation, wants to just get it done  Objective Filed Vitals:   04/23/13 1800 04/23/13 1802 04/23/13 2055 04/24/13 0549  BP: 129/70 140/65 148/76 144/67  Pulse: 75 65 71 68  Temp:  98.2 F (36.8 C) 98.6 F (37 C) 98.9 F (37.2 C)  TempSrc:  Oral Oral Oral  Resp:  18 18 18   Height:   5\' 10"  (1.778 m)   Weight:  77.5 kg (170 lb 13.7 oz) 77.8 kg (171 lb 8.3 oz)   SpO2:  100% 97% 95%    Physical Exam  General: Alert and oriented. No acute distress.  Heart: RRR 2/6 systolic murmur  Lungs:CTA, unlabored  Abdomen: soft, nontender. +BS  Extremities: No edema. Healed R TMA. L foot with dressing dry and intact  Dialysis Access: AVF @ LUA with + bruit   Dialysis: [facility]  4h 400/800 85.5kg Heparin 8700 LUA AVF  Hectorol 1 EPO none   Assessment/Plan:  1. Wound infection left foot / recent L great toe amp 3-25 / MRSA bacteremia - s/p irrigation & debridement 4/9 per Dr. Kellie Simmering, wound culture gram + cocci and rods, on Vancomycin per pharmcy, TEE 4/7 showed moderate LVH, EF 65%, no vegetation.  ID following. Probable left transmet ampuation Monday w Dr Sharol Given 2. ESRD - HD on MWF @ [place], Next HD monday 3. HTN/Volume - BP 144/67 on Doxazosin 2 mg qhs, Bystolic 5 mg qhs; below EDW, no signs or symptoms of fluid overload. 4. Anemia - Hgb 11.2, watch. No ESA 5. Sec HPT - Ca 9.7  Hectorol 1 mcg, Phos 4.1 no binders, 2Ca bath.  6. Nutrition - Alb 2.5, carb-mod renal diet, multivitamin, supplement. Encourage protien 7. DM - insulin per primary. 8. Diarrhea- cdiff negative. No complaints    Shelle Iron, NP [facility] [phone] 04/24/2013,11:13 AM  LOS: 8 days   Pt seen, examined, agree w assess/plan as above with additions as indicated.  [name] MD pager [phone]    cell [phone] 04/24/2013, 2:47 PM     Additional Objective Labs: Basic Metabolic Panel:  Recent Labs Lab 04/21/13 1608 04/22/13 0725  04/23/13 1156 04/24/13 0700  NA 138 138 137 139  K 3.3* 3.8 3.2* 3.6*  CL 95* 93* 93* 94*  CO2 25 23 25 27   GLUCOSE 469* 128* 174* 146*  BUN 44* 28* 38* 19  CREATININE 7.84* 5.75* 7.63* 5.41*  CALCIUM 9.2 9.7 9.5 9.7  PHOS 3.6  --  4.9* 4.1   Liver Function Tests:  Recent Labs Lab 04/21/13 1608 04/23/13 1156 04/24/13 0700  ALBUMIN 2.2* 2.2* 2.5*   No results found for this basename: LIPASE, AMYLASE,  in the last 168 hours CBC:  Recent Labs Lab 04/19/13 1310 04/19/13 1501 04/21/13 1608 04/22/13 0725 04/23/13 1156 04/24/13 0706  WBC 10.9* 10.2 10.8* 12.0* 12.9* 13.6*  NEUTROABS 8.0*  --   --   --   --   --   HGB 9.8* 10.4* 10.9* 12.1* 11.0* 11.2*  HCT 31.4* 33.0* 34.5* 39.1 35.0* 35.8*  MCV 85.3 85.3 83.7 86.5 85.2 85.0  PLT 210 189 241 240 256 266   Blood Culture    Component Value Date/Time   SDES WOUND LEFT FOOT 04/22/2013 1037   SPECREQUEST LEFT FIRST TOE PT ON VANCO VANCO 04/22/2013 1037   CULT  Value: ABUNDANT STAPHYLOCOCCUS AUREUS Note: RIFAMPIN AND GENTAMICIN SHOULD NOT BE USED AS SINGLE DRUGS FOR TREATMENT OF STAPH INFECTIONS. Performed at  [facility] 04/22/2013 1037   REPTSTATUS PENDING 04/22/2013 1037    Cardiac Enzymes: No results found for this basename: CKTOTAL, CKMB, CKMBINDEX, TROPONINI,  in the last 168 hours CBG:  Recent Labs Lab 04/23/13 0823 04/23/13 1113 04/23/13 1853 04/23/13 2056 04/24/13 0807  GLUCAP 131* 175* 101* 125* 131*   Iron Studies: No results found for this basename: IRON, TIBC, TRANSFERRIN, FERRITIN,  in the last 72 hours @lablastinr3 @ Studies/Results: No results found. Medications: . sodium chloride 20 mL/hr at 04/22/13 1002   . colchicine  0.6 mg Oral Daily  . doxazosin  2 mg Oral QHS  . doxercalciferol  1 mcg Intravenous Q M,W,F-HD  . feeding supplement (NEPRO CARB STEADY)  237 mL Oral BID BM  . heparin  5,000 Units Subcutaneous 3 times per day  . insulin aspart  0-5 Units Subcutaneous QHS  . insulin aspart   0-9 Units Subcutaneous TID WC  . levothyroxine  175 mcg Oral QAC breakfast  . multivitamin  1 tablet Oral QHS  . nebivolol  5 mg Oral QHS  . silver sulfADIAZINE   Topical Daily  . sodium chloride  3 mL Intravenous Q12H  . vancomycin  1,000 mg Intravenous Q M,W,F-HD

## 2013-04-24 NOTE — Progress Notes (Signed)
[facility] PROGRESS NOTE  Jack [name] AOZ:[id number] DOB: Dec 08, 1945 DOA: 04/16/2013 PCP: [name], MD   Interim History:  Jack [name]. He has a very complex medical history with diabetic foot infection. He was found to have gangrene of his left great toe. 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 He underwent amputation of his left great toe by Dr. Kellie Simmering on 04/07/2013       Assessment/Plan: #1 MRSA bacteremia Likely seeded from wound infection. When cultures growing abundant staph aureus with sensitivities pending. MRI of the foot negative for abscess or osteomyelitis TEE 04/20/2013 was negative for SBE with ejection fraction of 65%. Patient currently afebrile.white count is 13.6. Patient will likely need 8 weeks of IV antibiotics. Infectious disease is following and appreciate input and recommendations.  #2 poorly healing left first toe amputation Patient with a gangrenous foot. Patient is status post I&D of left first toe amputation wound per Dr. Kellie Simmering yesterday. Patient was seen by Dr. Sharol Given in consultation, and wants to reevaluate patient on Monday morning for possible revision surgery/possible transmetatarsal amputation on Monday evening. Continue IV antibiotics. Per Vascular surgery.  #3 diabetes mellitus type 1 Stable. Continue sliding scale insulin.  #4 hypertension Stable. Continue Cardura,bystolic.  #5 hypothyroidism Continue Synthroid.  #6 end-stage renal disease on hemodialysis Patient has dialysis Monday Wednesdays and Fridays. Nephrology is following. Per renal.  #7 anemia of chronic disease Likely secondary to problem #6. H&H stable follow.  #8 diarrhea C. difficile PCR is negative. May be secondary to Nephro-Vite. Patient refusing Nephro-Vite as he feels this maybe causing his diarrhea. Will DC  Nephro-Vite. Imodium as needed.  Code Status: full Family Communication: updated patient. Disposition  Plan: home versus SNF when medically stable.   Consultants:  Infectious diseases: Dr. Lucianne Lei dam 04/19/2013  Nephrology: Dr. Posey Pronto 04/17/2013  Vascular surgery Dr. Donnetta Hutching 04/17/2013  Orthopedics: Dr. Sharol Given 04/23/2013  Procedures:  I and D left first toe amputation wound 04/22/2013 per Dr. Kellie Simmering  MRI left foot 04/18/2013  MRI left ankle 04/17/2013  TEE 04/20/2013  Antibiotics:  IV vancomycin 4/4/ 2015  IV Zosyn 04/17/2013 - 04/19/2013  IV nafcillin 04/19/2013 - 04/20/2013  HPI/Subjective: Patient with one loose stool today. Patient refused Nephro-Vite as he feels this is the cause of his diarrhea. Patient states left foot is feeling better. Patient anxious to get procedure done on Monday.  Objective: Filed Vitals:   04/24/13 0549  BP: 144/67  Pulse: 68  Temp: 98.9 F (37.2 C)  Resp: 18    Intake/Output Summary (Last 24 hours) at 04/24/13 1333 Last data filed at 04/23/13 1802  Gross per 24 hour  Intake      0 ml  Output   2350 ml  Net  -2350 ml   Filed Weights   04/23/13 1412 04/23/13 1802 04/23/13 2055  Weight: 79.9 kg (176 lb 2.4 oz) 77.5 kg (170 lb 13.7 oz) 77.8 kg (171 lb 8.3 oz)    Exam:   General:  NAD  Cardiovascular: RRR  Respiratory: CTAB  Abdomen: SOFT, NONTENDER, NONDISTENDED, POSITIVE BOWEL SOUNDS.  Musculoskeletal: NO CLUBBING CYANOSIS OR EDEMA. lEFT FOOT WRAPPED STATUS POST i&d.  Data Reviewed: Basic Metabolic Panel:  Recent Labs Lab 04/19/13 1501 04/21/13 1608 04/22/13 0725 04/23/13 1156 04/24/13 0700  NA 141 138 138 137 139  K 4.0 3.3* 3.8 3.2* 3.6*  CL 92* 95* 93* 93* 94*  CO2 24 25 23 25 27   GLUCOSE 135* 117* 128* 174* 146*  BUN 54* 44* 28*  38* 19  CREATININE 9.47* 7.84* 5.75* 7.63* 5.41*  CALCIUM 9.1 9.2 9.7 9.5 9.7  PHOS 5.7* 3.6  --  4.9* 4.1   Liver Function Tests:  Recent Labs Lab 04/19/13 1501 04/21/13 1608 04/23/13 1156 04/24/13 0700  ALBUMIN 2.2* 2.2* 2.2* 2.5*   No results found for this basename:  LIPASE, AMYLASE,  in the last 168 hours No results found for this basename: AMMONIA,  in the last 168 hours CBC:  Recent Labs Lab 04/19/13 1310 04/19/13 1501 04/21/13 1608 04/22/13 0725 04/23/13 1156 04/24/13 0706  WBC 10.9* 10.2 10.8* 12.0* 12.9* 13.6*  NEUTROABS 8.0*  --   --   --   --   --   HGB 9.8* 10.4* 10.9* 12.1* 11.0* 11.2*  HCT 31.4* 33.0* 34.5* 39.1 35.0* 35.8*  MCV 85.3 85.3 83.7 86.5 85.2 85.0  PLT 210 189 241 240 256 266   Cardiac Enzymes: No results found for this basename: CKTOTAL, CKMB, CKMBINDEX, TROPONINI,  in the last 168 hours BNP (last 3 results) No results found for this basename: PROBNP,  in the last 8760 hours CBG:  Recent Labs Lab 04/23/13 1113 04/23/13 1853 04/23/13 2056 04/24/13 0807 04/24/13 1151  GLUCAP 175* 101* 125* 131* 141*    Recent Results (from the past 240 hour(s))  SURGICAL PCR SCREEN     Status: None   Collection Time    04/17/13  5:10 AM      Result Value Ref Range Status   MRSA, PCR NEGATIVE  NEGATIVE Final   Staphylococcus aureus NEGATIVE  NEGATIVE Final   Comment:            The Xpert SA Assay (FDA     approved for NASAL specimens     in patients over 11 years of age),     is one component of     a comprehensive surveillance     program.  Test performance has     been validated by [employer] for patients greater     than or equal to 38 year old.     It is not intended     to diagnose infection nor to     guide or monitor treatment.  CULTURE, BLOOD (ROUTINE X 2)     Status: None   Collection Time    04/17/13  5:48 AM      Result Value Ref Range Status   Specimen Description BLOOD RIGHT HAND   Final   Special Requests BOTTLES DRAWN AEROBIC ONLY 5CC   Final   Culture  Setup Time     Final   Value: 04/17/2013 12:44     Performed at [facility]   Culture     Final   Value: METHICILLIN RESISTANT STAPHYLOCOCCUS AUREUS     Note: RIFAMPIN AND GENTAMICIN SHOULD NOT BE USED AS SINGLE DRUGS FOR TREATMENT  OF STAPH INFECTIONS. This organism DOES NOT demonstrate inducible Clindamycin resistance in vitro. CRITICAL RESULT CALLED TO, READ BACK BY AND VERIFIED WITH: JAMIE COVINGTON      04/19/13 1437 BY SMITHERSJ     Note: Gram Stain Report Called to,Read Back By and Verified With: Virgilio Frees 04/18/12 @ 10:45AM BY RUSCOE A.     Performed at [facility]   Report Status 04/20/2013 FINAL   Final   Organism ID, Bacteria METHICILLIN RESISTANT STAPHYLOCOCCUS AUREUS   Final  CULTURE, BLOOD (ROUTINE X 2)     Status: None   Collection Time  04/17/13  5:50 AM      Result Value Ref Range Status   Specimen Description BLOOD RIGHT HAND   Final   Special Requests     Final   Value: BOTTLES DRAWN AEROBIC AND ANAEROBIC 10CC BLUE,5CC RED   Culture  Setup Time     Final   Value: 04/17/2013 12:44     Performed at [facility]   Culture     Final   Value: STAPHYLOCOCCUS AUREUS     Note: SUSCEPTIBILITIES PERFORMED ON PREVIOUS CULTURE WITHIN THE LAST 5 DAYS.     Note: Gram Stain Report Called to,Read Back By and Verified With: Earleen Reaper RN on 04/18/13 at 05:50 by Rise Mu     Performed at [facility]   Report Status 04/20/2013 FINAL   Final  CULTURE, BLOOD (ROUTINE X 2)     Status: None   Collection Time    04/19/13  1:04 PM      Result Value Ref Range Status   Specimen Description BLOOD RIGHT HAND   Final   Special Requests BOTTLES DRAWN AEROBIC ONLY [facility]   Final   Culture  Setup Time     Final   Value: 04/19/2013 16:06     Performed at [facility]   Culture     Final   Value:        BLOOD CULTURE RECEIVED NO GROWTH TO DATE CULTURE WILL BE HELD FOR 5 DAYS BEFORE ISSUING A FINAL NEGATIVE REPORT     Performed at [facility]   Report Status PENDING   Incomplete  CULTURE, BLOOD (ROUTINE X 2)     Status: None   Collection Time    04/19/13  1:10 PM      Result Value Ref Range Status   Specimen Description BLOOD RIGHT ANTECUBITAL   Final   Special Requests  BOTTLES DRAWN AEROBIC ONLY 5CC   Final   Culture  Setup Time     Final   Value: 04/19/2013 16:06     Performed at [facility]   Culture     Final   Value:        BLOOD CULTURE RECEIVED NO GROWTH TO DATE CULTURE WILL BE HELD FOR 5 DAYS BEFORE ISSUING A FINAL NEGATIVE REPORT     Performed at [facility]   Report Status PENDING   Incomplete  WOUND CULTURE     Status: None   Collection Time    04/22/13 10:37 AM      Result Value Ref Range Status   Specimen Description WOUND LEFT FOOT   Final   Special Requests LEFT FIRST TOE PT ON VANCO VANCO   Final   Gram Stain     Final   Value: FEW WBC PRESENT, PREDOMINANTLY PMN     FEW SQUAMOUS EPITHELIAL CELLS PRESENT     MODERATE GRAM POSITIVE COCCI IN CLUSTERS     MODERATE GRAM POSITIVE RODS     Performed at [facility]   Culture     Final   Value: ABUNDANT STAPHYLOCOCCUS AUREUS     Note: RIFAMPIN AND GENTAMICIN SHOULD NOT BE USED AS SINGLE DRUGS FOR TREATMENT OF STAPH INFECTIONS.     Performed at [facility]   Report Status PENDING   Incomplete  CLOSTRIDIUM DIFFICILE BY PCR     Status: None   Collection Time    04/24/13 12:25 AM      Result Value Ref Range Status  C difficile by pcr NEGATIVE  NEGATIVE Final     Studies: No results found.  Scheduled Meds: . colchicine  0.6 mg Oral Daily  . doxazosin  2 mg Oral QHS  . doxercalciferol  1 mcg Intravenous Q M,W,F-HD  . feeding supplement (NEPRO CARB STEADY)  237 mL Oral BID BM  . heparin  5,000 Units Subcutaneous 3 times per day  . insulin aspart  0-5 Units Subcutaneous QHS  . insulin aspart  0-9 Units Subcutaneous TID WC  . levothyroxine  175 mcg Oral QAC breakfast  . loperamide  4 mg Oral Once  . multivitamin  1 tablet Oral QHS  . nebivolol  5 mg Oral QHS  . silver sulfADIAZINE   Topical Daily  . sodium chloride  3 mL Intravenous Q12H  . vancomycin  1,000 mg Intravenous Q M,W,F-HD   Continuous Infusions: . sodium chloride 20 mL/hr at 04/22/13  1002    Principal Problem:   MRSA bacteremia Active Problems:   Essential hypertension, benign   Diabetes mellitus type 1, controlled, with complications   Anemia in chronic kidney disease   End stage renal disease   Peripheral vascular disease, unspecified   Diabetes mellitus   Toe gangrene   Wound infection after surgery   Fever, unspecified   Diarrhea    Time spent: 35 minutes    Eugenie Filler M.D. [facility] Pager [phone]. If 7PM-7AM, please contact night-coverage at [email or web address], password [facility] 04/24/2013, 1:33 PM  LOS: 8 days

## 2013-04-24 NOTE — Progress Notes (Signed)
Physical Therapy Treatment Patient Details Name: BRYSTEN REISTER MRN: [id number] DOB: 1945-01-16 Today's Date: 04-30-13    History of Present Illness Patient is a 68 year old gentleman end stage renal disease diabetes severe peripheral vascular disease who is status post limb salvage surgery with amputation of the left great toe. Transmet planned for 4/13    PT Comments    On arrival pt stated he would be willing to get to the chair but on bedpan. Assisted off bedpan, pericare and linen change with assist to EOB. Once EOB pt stated to fatigued to get OOB or perform HEP and would perform HEP on his own later. RN educated for pt concerns with delay in pericare and fatigue limiting mobility. Will continue to follow.   Follow Up Recommendations  Home health PT;Supervision/Assistance - 24 hour     Equipment Recommendations       Recommendations for Other Services       Precautions / Restrictions Precautions Precautions: Fall Required Braces or Orthoses: Other Brace/Splint Other Brace/Splint: darco shoe Lt LE  Restrictions Other Position/Activity Restrictions: waiting clarification from MD     Mobility  Bed Mobility Overal bed mobility: Needs Assistance Bed Mobility: Sidelying to Sit;Sit to Sidelying;Rolling Rolling: Modified independent (Device/Increase time) Sidelying to sit: Min assist Supine to sit: Min assist     General bed mobility comments: pt able to grasp rail and roll without assit but required assist to elevate trunk to sitting and legs back to surface. Performed side to sit x 2 with pt unable to perform further transfers due to fatigue  Transfers                    Ambulation/Gait                 Stairs            Wheelchair Mobility    Modified Rankin (Stroke Patients Only)       Balance                                    Cognition Arousal/Alertness: Awake/alert Behavior During Therapy: Flat affect Overall  Cognitive Status: Within Functional Limits for tasks assessed                      Exercises      General Comments        Pertinent Vitals/Pain No pain VSS    Home Living                      Prior Function            PT Goals (current goals can now be found in the care plan section) Progress towards PT goals: Not progressing toward goals - comment (due to fatigue and diarrhea)    Frequency       PT Plan Current plan remains appropriate    Co-evaluation             End of Session   Activity Tolerance: Patient limited by fatigue Patient left: in bed;with call bell/phone within reach;with nursing/sitter in room     Time: 1129-1151 PT Time Calculation (min): 22 min  Charges:  $Therapeutic Activity: 8-22 mins                    G Codes:      Valentin Benney B Wahid Holley 30-Apr-2013,  12:43 PM Elwyn Reach, [place]

## 2013-04-24 NOTE — Progress Notes (Signed)
Patient ID: Jack [name], male   DOB: 09/07/1945, 68 y.o.   MRN: [id number] Vascular Surgery Progress Note  Subjective: 2  days post I&D left first toe amputation site   Objec daystive:  Filed Vitals:   04/24/13 0549  BP: 144/67  Pulse: 68  Temp: 98.9 F (37.2 C)  Resp: 18     the patient remains afebrile Left foot dressing change. Dry ischemic changes around skin edges and depths of wound left first toe amputation site. No proximal cellulitis noted. No fluctuance.   Labs:  Recent Labs Lab 04/22/13 0725 04/23/13 1156 04/24/13 0700  CREATININE 5.75* 7.63* 5.41*    Recent Labs Lab 04/22/13 0725 04/23/13 1156 04/24/13 0700  NA 138 137 139  K 3.8 3.2* 3.6*  CL 93* 93* 94*  CO2 23 25 27   BUN 28* 38* 19  CREATININE 5.75* 7.63* 5.41*  GLUCOSE 128* 174* 146*  CALCIUM 9.7 9.5 9.7    Recent Labs Lab 04/22/13 0725 04/23/13 1156 04/24/13 0706  WBC 12.0* 12.9* 13.6*  HGB 12.1* 11.0* 11.2*  HCT 39.1 35.0* 35.8*  PLT 240 256 266    Recent Labs Lab 04/22/13 0725  INR 1.11    I/O last 3 completed shifts: In: 240 [P.O.:240] Out: 2350 [Other:2350]  Imaging: No results found.  Assessment/Plan:  POD #2   LOS: 8 days  s/p Procedure(s): IRRIGATION AND DEBRIDEMENT LEFT FIRST TOE AMPUTATION WOUND    we'll continue local dressing changes over the weekend and Dr. Sharol Given  to plan probable transmetatarsal amputation left foot on Monday    [name], MD 04/24/2013 9:22 AM

## 2013-04-25 LAB — CBC
HCT: 35.5 % — ABNORMAL LOW (ref 39.0–52.0)
Hemoglobin: 11.1 g/dL — ABNORMAL LOW (ref 13.0–17.0)
MCH: 26.9 pg (ref 26.0–34.0)
MCHC: 31.3 g/dL (ref 30.0–36.0)
MCV: 86 fL (ref 78.0–100.0)
PLATELETS: 298 10*3/uL (ref 150–400)
RBC: 4.13 MIL/uL — AB (ref 4.22–5.81)
RDW: 14.8 % (ref 11.5–15.5)
WBC: 14.5 10*3/uL — ABNORMAL HIGH (ref 4.0–10.5)

## 2013-04-25 LAB — RENAL FUNCTION PANEL
ALBUMIN: 2.4 g/dL — AB (ref 3.5–5.2)
BUN: 28 mg/dL — ABNORMAL HIGH (ref 6–23)
CALCIUM: 9.9 mg/dL (ref 8.4–10.5)
CO2: 25 meq/L (ref 19–32)
Chloride: 92 mEq/L — ABNORMAL LOW (ref 96–112)
Creatinine, Ser: 6.91 mg/dL — ABNORMAL HIGH (ref 0.50–1.35)
GFR calc Af Amer: 8 mL/min — ABNORMAL LOW (ref >90)
GFR, EST NON AFRICAN AMERICAN: 7 mL/min — AB (ref >90)
Glucose, Bld: 92 mg/dL (ref 70–99)
PHOSPHORUS: 5.3 mg/dL — AB (ref 2.3–4.6)
Potassium: 3.4 mEq/L — ABNORMAL LOW (ref 3.7–5.3)
SODIUM: 137 meq/L (ref 137–147)

## 2013-04-25 LAB — WOUND CULTURE

## 2013-04-25 LAB — CULTURE, BLOOD (ROUTINE X 2)
CULTURE: NO GROWTH
Culture: NO GROWTH

## 2013-04-25 LAB — GLUCOSE, CAPILLARY
Glucose-Capillary: 97 mg/dL (ref 70–99)
Glucose-Capillary: 139 mg/dL — ABNORMAL HIGH (ref 70–99)
Glucose-Capillary: 118 mg/dL — ABNORMAL HIGH (ref 70–99)

## 2013-04-25 NOTE — Progress Notes (Signed)
Patient ID: Jack [name], male   DOB: 1945-02-14, 68 y.o.   MRN: [id number] Vascular Surgery Progress Note  Subjective: 4 days post I&D left first toe amputation site-dressing changes twice a day with Silvadene  Objective:  Filed Vitals:   04/25/13 0500  BP: 139/70  Pulse: 64  Temp: 99.4 F (37.4 C)  Resp: 18    Dressing intact today   Labs:  Recent Labs Lab 04/23/13 1156 04/24/13 0700 04/25/13 0400  CREATININE 7.63* 5.41* 6.91*    Recent Labs Lab 04/23/13 1156 04/24/13 0700 04/25/13 0400  NA 137 139 137  K 3.2* 3.6* 3.4*  CL 93* 94* 92*  CO2 25 27 25   BUN 38* 19 28*  CREATININE 7.63* 5.41* 6.91*  GLUCOSE 174* 146* 92  CALCIUM 9.5 9.7 9.9    Recent Labs Lab 04/23/13 1156 04/24/13 0706 04/25/13 0400  WBC 12.9* 13.6* 14.5*  HGB 11.0* 11.2* 11.1*  HCT 35.0* 35.8* 35.5*  PLT 256 266 298    Recent Labs Lab 04/22/13 0725  INR 1.11       Imaging: No results found.  Assessment/Plan:  POD #4  LOS: 9 days  s/p Procedure(s): IRRIGATION AND DEBRIDEMENT LEFT FIRST TOE AMPUTATION WOUND   The patient likely to have transmetatarsal amputation left foot tomorrow by Dr. Rosana Berger keep patient n.p.o. after midnight tonight   [name], MD 04/25/2013 9:32 AM

## 2013-04-25 NOTE — Progress Notes (Signed)
Patient ID: Jack [name], male   DOB: [birth year], 68 y.o.   MRN: [id number] Examination of left foot there is no progressive in of the ischemic changes. Ischemia is limited to the first and second ray. His mid foot feels warm.  Will plan for left foot first and second ray amputation on Monday a proximally 5 PM  I would recommend holding dialysis on Monday and proceeding with dialysis on Tuesday if possible. The combination of dialysis and surgery on the same day may be additional stress for the patient.

## 2013-04-25 NOTE — Progress Notes (Signed)
Subjective:   Doing better today. No complaints. Reports vascular surgery came by this am and plan for OR tomorrow for left transmet amputation.  Objective Filed Vitals:   04/24/13 1350 04/24/13 1900 04/24/13 2100 04/25/13 0500  BP: 141/68 130/74 134/71 139/70  Pulse: 73 72 65 64  Temp: 98.4 F (36.9 C) 99.1 F (37.3 C) 99.2 F (37.3 C) 99.4 F (37.4 C)  TempSrc: Oral Oral Oral Oral  Resp: 21 22 20 18   Height:      Weight:      SpO2: 100% 100% 100% 99%   Physical Exam  General: Alert and oriented, eating breakfast. No acute distress.  Heart: RRR 2/6 systolic murmur  Lungs:CTA, unlabored  Abdomen: soft, nontender. +BS  Extremities: No edema. Healed R TMA. L foot with dressing dry and intact  Dialysis Access: AVF @ LUA with + bruit   Dialysis: [facility]  4h 400/800 85.5kg Heparin 8700 LUA AVF  Hectorol 1 EPO none   Assessment/Plan:  1. Wound infection left foot / recent L great toe amp 3-25 / MRSA bacteremia - s/p irrigation & debridement 4/9 per Dr. Kellie Simmering, wound culture gram + cocci and rods, on Vancomycin per pharmcy, TEE 4/7 showed moderate LVH, EF 65%, no vegetation. ID following. Left transmet ampuation Monday w Dr. Sharol Given. 2. ESRD - HD on MWF @ [place], Next HD Monday. No heparin since OR 3. HTN/Volume - BP 141/68 on Doxazosin 2 mg qhs, Bystolic 5 mg qhs; below EDW, no signs or symptoms of fluid overload, lower edw at DC 4. Anemia - Hgb 11.1, stable. No ESA Watch CBC post op 5. Sec HPT - Ca 9.9 Hectorol 1 mcg, Phos 5.3 no binders, 2Ca bath.  6. Nutrition - Alb 2.4, carb-mod renal diet, multivitamin, supplement. Encourage protien 7. DM - insulin per primary. 8. Diarrhea- cdiff negative. No complaints  Shelle Iron, NP [place] [phone] 04/25/2013,9:30 AM  LOS: 9 days   Pt seen, examined and agree w A/P as above. Continues on IV abx but foot not getting better.  Reportedly may be having transmetatarsal amputation by ortho tomorrow.  For HD  tomorrow, stable from renal standpoint.  [name] MD pager [phone]    cell [phone] 04/25/2013, 2:30 PM     Additional Objective Labs: Basic Metabolic Panel:  Recent Labs Lab 04/23/13 1156 04/24/13 0700 04/25/13 0400  NA 137 139 137  K 3.2* 3.6* 3.4*  CL 93* 94* 92*  CO2 25 27 25   GLUCOSE 174* 146* 92  BUN 38* 19 28*  CREATININE 7.63* 5.41* 6.91*  CALCIUM 9.5 9.7 9.9  PHOS 4.9* 4.1 5.3*   Liver Function Tests:  Recent Labs Lab 04/23/13 1156 04/24/13 0700 04/25/13 0400  ALBUMIN 2.2* 2.5* 2.4*   No results found for this basename: LIPASE, AMYLASE,  in the last 168 hours CBC:  Recent Labs Lab 04/19/13 1310  04/21/13 1608 04/22/13 0725 04/23/13 1156 04/24/13 0706 04/25/13 0400  WBC 10.9*  < > 10.8* 12.0* 12.9* 13.6* 14.5*  NEUTROABS 8.0*  --   --   --   --   --   --   HGB 9.8*  < > 10.9* 12.1* 11.0* 11.2* 11.1*  HCT 31.4*  < > 34.5* 39.1 35.0* 35.8* 35.5*  MCV 85.3  < > 83.7 86.5 85.2 85.0 86.0  PLT 210  < > 241 240 256 266 298  < > = values in this interval not displayed. Blood Culture    Component Value Date/Time  SDES WOUND LEFT FOOT 04/22/2013 1037   SPECREQUEST LEFT FIRST TOE PT ON VANCO VANCO 04/22/2013 1037   CULT  Value: ABUNDANT METHICILLIN RESISTANT STAPHYLOCOCCUS AUREUS Note: RIFAMPIN AND GENTAMICIN SHOULD NOT BE USED AS SINGLE DRUGS FOR TREATMENT OF STAPH INFECTIONS. This organism DOES NOT demonstrate inducible Clindamycin resistance in vitro. CRITICAL RESULT CALLED TO, READ BACK BY AND VERIFIED WITH: ALBERT R @ 7:40AM  04/25/13 BY DWEEKS Performed at [facility] 04/22/2013 1037   REPTSTATUS 04/25/2013 FINAL 04/22/2013 1037    Cardiac Enzymes: No results found for this basename: CKTOTAL, CKMB, CKMBINDEX, TROPONINI,  in the last 168 hours CBG:  Recent Labs Lab 04/24/13 0807 04/24/13 1151 04/24/13 1719 04/24/13 2058 04/25/13 0753  GLUCAP 131* 141* 155* 111* 97   Iron Studies: No results found for this basename: IRON, TIBC,  TRANSFERRIN, FERRITIN,  in the last 72 hours @lablastinr3 @ Studies/Results: No results found. Medications: . sodium chloride 20 mL/hr at 04/22/13 1002   . colchicine  0.3 mg Oral Daily  . doxazosin  2 mg Oral QHS  . doxercalciferol  1 mcg Intravenous Q M,W,F-HD  . feeding supplement (NEPRO CARB STEADY)  237 mL Oral BID BM  . heparin  5,000 Units Subcutaneous 3 times per day  . insulin aspart  0-5 Units Subcutaneous QHS  . insulin aspart  0-9 Units Subcutaneous TID WC  . levothyroxine  175 mcg Oral QAC breakfast  . nebivolol  5 mg Oral QHS  . silver sulfADIAZINE   Topical Daily  . sodium chloride  3 mL Intravenous Q12H  . vancomycin  1,000 mg Intravenous Q M,W,F-HD

## 2013-04-25 NOTE — Progress Notes (Signed)
[facility] PROGRESS NOTE  Jack [name]:[id number] DOB: 1945/06/09 DOA: 04/16/2013 PCP: [name], MD   Interim History:  Jack [name]. He has a very complex medical history with diabetic foot infection. He was found to have gangrene of his left great toe. 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 He underwent amputation of his left great toe by Dr. Kellie Simmering on 04/07/2013       Assessment/Plan: #1 MRSA bacteremia Likely seeded from wound infection. When cultures growing abundant staph aureus with sensitivities pending. MRI of the foot negative for abscess or osteomyelitis TEE 04/20/2013 was negative for SBE with ejection fraction of 65%. Patient currently afebrile.white count is 14.5. Patient will likely need 8 weeks of IV antibiotics. Infectious disease is following and appreciate input and recommendations.  #2 poorly healing left first toe amputation Patient with a gangrenous foot. Patient is status post I&D of left first toe amputation wound per Dr. Kellie Simmering yesterday. Patient was seen by Dr. Sharol Given in consultation, and wants to reevaluate patient on Monday morning for possible revision surgery/possible transmetatarsal amputation on Monday evening. Continue IV antibiotics. Per Vascular surgery.  #3 diabetes mellitus type 1 Stable. Continue sliding scale insulin.  #4 hypertension Stable. Continue Cardura,bystolic.  #5 hypothyroidism Continue Synthroid.  #6 end-stage renal disease on hemodialysis Patient has dialysis Monday Wednesdays and Fridays. Nephrology is following. Per renal.  #7 anemia of chronic disease Likely secondary to problem #6. H&H stable follow.  #8 diarrhea C. difficile PCR is negative. May be secondary to Nephro-Vite. Patient refusing Nephro-Vite as he feels this maybe causing his diarrhea.  Imodium as needed.  Code Status: full Family Communication: updated patient. Disposition Plan: home versus SNF  when medically stable.   Consultants:  Infectious diseases: Dr. Lucianne Lei dam 04/19/2013  Nephrology: Dr. Posey Pronto 04/17/2013  Vascular surgery Dr. Donnetta Hutching 04/17/2013  Orthopedics: Dr. Sharol Given 04/23/2013  Procedures:  I and D left first toe amputation wound 04/22/2013 per Dr. Kellie Simmering  MRI left foot 04/18/2013  MRI left ankle 04/17/2013  TEE 04/20/2013  Antibiotics:  IV vancomycin 4/4/ 2015  IV Zosyn 04/17/2013 - 04/19/2013  IV nafcillin 04/19/2013 - 04/20/2013  HPI/Subjective: Patient states diarrhea has improved. Patient refused Nephro-Vite as he feels this is the cause of his diarrhea. Patient states left foot is feeling better. Patient anxious to get procedure done on Monday.  Objective: Filed Vitals:   04/25/13 1300  BP: 140/68  Pulse: 63  Temp: 98.9 F (37.2 C)  Resp: 18    Intake/Output Summary (Last 24 hours) at 04/25/13 1411 Last data filed at 04/25/13 1300  Gross per 24 hour  Intake    240 ml  Output      0 ml  Net    240 ml   Filed Weights   04/23/13 1412 04/23/13 1802 04/23/13 2055  Weight: 79.9 kg (176 lb 2.4 oz) 77.5 kg (170 lb 13.7 oz) 77.8 kg (171 lb 8.3 oz)    Exam:   General:  NAD  Cardiovascular: RRR  Respiratory: CTAB  Abdomen: SOFT, NONTENDER, NONDISTENDED, POSITIVE BOWEL SOUNDS.  Musculoskeletal: NO CLUBBING CYANOSIS OR EDEMA. lEFT FOOT WRAPPED STATUS POST i&d.  Data Reviewed: Basic Metabolic Panel:  Recent Labs Lab 04/19/13 1501 04/21/13 1608 04/22/13 0725 04/23/13 1156 04/24/13 0700 04/25/13 0400  NA 141 138 138 137 139 137  K 4.0 3.3* 3.8 3.2* 3.6* 3.4*  CL 92* 95* 93* 93* 94* 92*  CO2 24 25 23 25 27 25   GLUCOSE 135* 117* 128* 174* 146*  92  BUN 54* 44* 28* 38* 19 28*  CREATININE 9.47* 7.84* 5.75* 7.63* 5.41* 6.91*  CALCIUM 9.1 9.2 9.7 9.5 9.7 9.9  PHOS 5.7* 3.6  --  4.9* 4.1 5.3*   Liver Function Tests:  Recent Labs Lab 04/19/13 1501 04/21/13 1608 04/23/13 1156 04/24/13 0700 04/25/13 0400  ALBUMIN 2.2* 2.2*  2.2* 2.5* 2.4*   No results found for this basename: LIPASE, AMYLASE,  in the last 168 hours No results found for this basename: AMMONIA,  in the last 168 hours CBC:  Recent Labs Lab 04/19/13 1310  04/21/13 1608 04/22/13 0725 04/23/13 1156 04/24/13 0706 04/25/13 0400  WBC 10.9*  < > 10.8* 12.0* 12.9* 13.6* 14.5*  NEUTROABS 8.0*  --   --   --   --   --   --   HGB 9.8*  < > 10.9* 12.1* 11.0* 11.2* 11.1*  HCT 31.4*  < > 34.5* 39.1 35.0* 35.8* 35.5*  MCV 85.3  < > 83.7 86.5 85.2 85.0 86.0  PLT 210  < > 241 240 256 266 298  < > = values in this interval not displayed. Cardiac Enzymes: No results found for this basename: CKTOTAL, CKMB, CKMBINDEX, TROPONINI,  in the last 168 hours BNP (last 3 results) No results found for this basename: PROBNP,  in the last 8760 hours CBG:  Recent Labs Lab 04/24/13 1151 04/24/13 1719 04/24/13 2058 04/25/13 0753 04/25/13 1226  GLUCAP 141* 155* 111* 97 139*    Recent Results (from the past 240 hour(s))  SURGICAL PCR SCREEN     Status: None   Collection Time    04/17/13  5:10 AM      Result Value Ref Range Status   MRSA, PCR NEGATIVE  NEGATIVE Final   Staphylococcus aureus NEGATIVE  NEGATIVE Final   Comment:            The Xpert SA Assay (FDA     approved for NASAL specimens     in patients over 48 years of age),     is one component of     a comprehensive surveillance     program.  Test performance has     been validated by [employer] for patients greater     than or equal to 54 year old.     It is not intended     to diagnose infection nor to     guide or monitor treatment.  CULTURE, BLOOD (ROUTINE X 2)     Status: None   Collection Time    04/17/13  5:48 AM      Result Value Ref Range Status   Specimen Description BLOOD RIGHT HAND   Final   Special Requests BOTTLES DRAWN AEROBIC ONLY 5CC   Final   Culture  Setup Time     Final   Value: 04/17/2013 12:44     Performed at [facility]   Culture     Final   Value:  METHICILLIN RESISTANT STAPHYLOCOCCUS AUREUS     Note: RIFAMPIN AND GENTAMICIN SHOULD NOT BE USED AS SINGLE DRUGS FOR TREATMENT OF STAPH INFECTIONS. This organism DOES NOT demonstrate inducible Clindamycin resistance in vitro. CRITICAL RESULT CALLED TO, READ BACK BY AND VERIFIED WITH: JAMIE COVINGTON      04/19/13 1437 BY SMITHERSJ     Note: Gram Stain Report Called to,Read Back By and Verified With: Virgilio Frees 04/18/12 @ 10:45AM BY RUSCOE A.     Performed at [facility]   Report Status 04/20/2013 FINAL   Final   Organism ID, Bacteria METHICILLIN RESISTANT STAPHYLOCOCCUS AUREUS   Final  CULTURE, BLOOD (ROUTINE X 2)     Status: None   Collection Time    04/17/13  5:50 AM      Result Value Ref Range Status   Specimen Description BLOOD RIGHT HAND   Final   Special Requests     Final   Value: BOTTLES DRAWN AEROBIC AND ANAEROBIC 10CC BLUE,5CC RED   Culture  Setup Time     Final   Value: 04/17/2013 12:44     Performed at [facility]   Culture     Final   Value: STAPHYLOCOCCUS AUREUS     Note: SUSCEPTIBILITIES PERFORMED ON PREVIOUS CULTURE WITHIN THE LAST 5 DAYS.     Note: Gram Stain Report Called to,Read Back By and Verified With: Earleen Reaper RN on 04/18/13 at 05:50 by Rise Mu     Performed at [facility]   Report Status 04/20/2013 FINAL   Final  CULTURE, BLOOD (ROUTINE X 2)     Status: None   Collection Time    04/19/13  1:04 PM      Result Value Ref Range Status   Specimen Description BLOOD RIGHT HAND   Final   Special Requests BOTTLES DRAWN AEROBIC ONLY [facility]   Final   Culture  Setup Time     Final   Value: 04/19/2013 16:06     Performed at [facility]   Culture     Final   Value: NO GROWTH 5 DAYS     Performed at [facility]   Report Status 04/25/2013 FINAL   Final  CULTURE, BLOOD (ROUTINE X 2)     Status: None   Collection Time    04/19/13  1:10 PM      Result Value Ref Range Status   Specimen Description BLOOD RIGHT ANTECUBITAL    Final   Special Requests BOTTLES DRAWN AEROBIC ONLY 5CC   Final   Culture  Setup Time     Final   Value: 04/19/2013 16:06     Performed at [facility]   Culture     Final   Value: NO GROWTH 5 DAYS     Performed at [facility]   Report Status 04/25/2013 FINAL   Final  WOUND CULTURE     Status: None   Collection Time    04/22/13 10:37 AM      Result Value Ref Range Status   Specimen Description WOUND LEFT FOOT   Final   Special Requests LEFT FIRST TOE PT ON VANCO VANCO   Final   Gram Stain     Final   Value: FEW WBC PRESENT, PREDOMINANTLY PMN     FEW SQUAMOUS EPITHELIAL CELLS PRESENT     MODERATE GRAM POSITIVE COCCI IN CLUSTERS     MODERATE GRAM POSITIVE RODS     Performed at [facility]   Culture     Final   Value: ABUNDANT METHICILLIN RESISTANT STAPHYLOCOCCUS AUREUS     Note: RIFAMPIN AND GENTAMICIN SHOULD NOT BE USED AS SINGLE DRUGS FOR TREATMENT OF STAPH INFECTIONS. This organism DOES NOT demonstrate inducible Clindamycin resistance in vitro. CRITICAL RESULT CALLED TO, READ BACK BY AND VERIFIED WITH: ALBERT R @ 7:40AM      04/25/13 BY DWEEKS     Performed at [facility]   Report Status 04/25/2013 FINAL  Final   Organism ID, Bacteria METHICILLIN RESISTANT STAPHYLOCOCCUS AUREUS   Final  CLOSTRIDIUM DIFFICILE BY PCR     Status: None   Collection Time    04/24/13 12:25 AM      Result Value Ref Range Status   C difficile by pcr NEGATIVE  NEGATIVE Final     Studies: No results found.  Scheduled Meds: . colchicine  0.3 mg Oral Daily  . doxazosin  2 mg Oral QHS  . doxercalciferol  1 mcg Intravenous Q M,W,F-HD  . feeding supplement (NEPRO CARB STEADY)  237 mL Oral BID BM  . heparin  5,000 Units Subcutaneous 3 times per day  . insulin aspart  0-5 Units Subcutaneous QHS  . insulin aspart  0-9 Units Subcutaneous TID WC  . levothyroxine  175 mcg Oral QAC breakfast  . nebivolol  5 mg Oral QHS  . silver sulfADIAZINE   Topical Daily  . sodium  chloride  3 mL Intravenous Q12H  . vancomycin  1,000 mg Intravenous Q M,W,F-HD   Continuous Infusions: . sodium chloride 20 mL/hr at 04/22/13 1002    Principal Problem:   MRSA bacteremia Active Problems:   Essential hypertension, benign   Diabetes mellitus type 1, controlled, with complications   Anemia in chronic kidney disease   End stage renal disease   Peripheral vascular disease, unspecified   Diabetes mellitus   Toe gangrene   Wound infection after surgery   Fever, unspecified   Diarrhea    Time spent: 35 minutes    Eugenie Filler M.D. [facility] Pager [phone]. If 7PM-7AM, please contact night-coverage at [email or web address], password [facility] 04/25/2013, 2:11 PM  LOS: 9 days

## 2013-04-26 ENCOUNTER — Inpatient Hospital Stay (HOSPITAL_COMMUNITY): Payer: Medicare Other | Admitting: Anesthesiology

## 2013-04-26 ENCOUNTER — Encounter (HOSPITAL_COMMUNITY)
Admission: EM | Disposition: A | Discharge status: Home Health | Payer: Self-pay | Source: Home / Self Care | Attending: Internal Medicine

## 2013-04-26 ENCOUNTER — Encounter (HOSPITAL_COMMUNITY): Payer: Medicare Other | Admitting: Anesthesiology

## 2013-04-26 ENCOUNTER — Encounter: Payer: Self-pay | Admitting: Vascular Surgery

## 2013-04-26 ENCOUNTER — Encounter (HOSPITAL_COMMUNITY): Payer: Self-pay | Admitting: Vascular Surgery

## 2013-04-26 HISTORY — PX: AMPUTATION: SHX166

## 2013-04-26 LAB — CBC
HCT: 34.8 % — ABNORMAL LOW (ref 39.0–52.0)
Hemoglobin: 11.1 g/dL — ABNORMAL LOW (ref 13.0–17.0)
MCH: 26.7 pg (ref 26.0–34.0)
MCHC: 31.9 g/dL (ref 30.0–36.0)
MCV: 83.9 fL (ref 78.0–100.0)
Platelets: 334 K/uL (ref 150–400)
RBC: 4.15 MIL/uL — ABNORMAL LOW (ref 4.22–5.81)
RDW: 14.6 % (ref 11.5–15.5)
WBC: 13.9 K/uL — ABNORMAL HIGH (ref 4.0–10.5)

## 2013-04-26 LAB — RENAL FUNCTION PANEL
Albumin: 2.5 g/dL — ABNORMAL LOW (ref 3.5–5.2)
BUN: 43 mg/dL — ABNORMAL HIGH (ref 6–23)
CO2: 24 meq/L (ref 19–32)
Calcium: 9.7 mg/dL (ref 8.4–10.5)
Chloride: 93 meq/L — ABNORMAL LOW (ref 96–112)
Creatinine, Ser: 9.05 mg/dL — ABNORMAL HIGH (ref 0.50–1.35)
GFR calc Af Amer: 6 mL/min — ABNORMAL LOW
GFR calc non Af Amer: 5 mL/min — ABNORMAL LOW
Glucose, Bld: 85 mg/dL (ref 70–99)
Phosphorus: 6.3 mg/dL — ABNORMAL HIGH (ref 2.3–4.6)
Potassium: 3.8 meq/L (ref 3.7–5.3)
Sodium: 138 meq/L (ref 137–147)

## 2013-04-26 LAB — GLUCOSE, CAPILLARY
GLUCOSE-CAPILLARY: 88 mg/dL (ref 70–99)
GLUCOSE-CAPILLARY: 102 mg/dL — AB (ref 70–99)
GLUCOSE-CAPILLARY: 151 mg/dL — AB (ref 70–99)
Glucose-Capillary: 136 mg/dL — ABNORMAL HIGH (ref 70–99)
Glucose-Capillary: 134 mg/dL — ABNORMAL HIGH (ref 70–99)
Glucose-Capillary: 100 mg/dL — ABNORMAL HIGH (ref 70–99)

## 2013-04-26 SURGERY — AMPUTATION, FOOT, RAY
Anesthesia: General | Site: Foot | Laterality: Left

## 2013-04-26 MED ORDER — SODIUM CHLORIDE 0.9 % IV SOLN: 100.0000 mL | INTRAVENOUS | Status: DC | PRN

## 2013-04-26 MED ORDER — PHENYLEPHRINE HCL 10 MG/ML IJ SOLN
INTRAMUSCULAR | Status: DC | PRN
  Administered 2013-04-26: 80 ug via INTRAVENOUS

## 2013-04-26 MED ORDER — PENTAFLUOROPROP-TETRAFLUOROETH EX AERO: 1.0000 "application " | INHALATION_SPRAY | CUTANEOUS | Status: DC | PRN

## 2013-04-26 MED ORDER — FENTANYL CITRATE 0.05 MG/ML IJ SOLN
INTRAMUSCULAR | Status: AC
  Filled 2013-04-26: qty 5

## 2013-04-26 MED ORDER — METOCLOPRAMIDE HCL 5 MG PO TABS
5.0000 mg | ORAL_TABLET | Freq: Three times a day (TID) | ORAL | Status: DC | PRN
Start: 2013-04-26 — End: 2013-04-30

## 2013-04-26 MED ORDER — CHLORHEXIDINE GLUCONATE 4 % EX LIQD
60.0000 mL | Freq: Once | CUTANEOUS | Status: AC
  Administered 2013-04-26: 4 via TOPICAL
  Filled 2013-04-26: qty 60

## 2013-04-26 MED ORDER — CEFAZOLIN SODIUM-DEXTROSE 2-3 GM-% IV SOLR
2.0000 g | INTRAVENOUS | Status: DC
  Filled 2013-04-26: qty 50

## 2013-04-26 MED ORDER — LIDOCAINE-PRILOCAINE 2.5-2.5 % EX CREA: 1.0000 "application " | TOPICAL_CREAM | CUTANEOUS | Status: DC | PRN

## 2013-04-26 MED ORDER — ONDANSETRON HCL 4 MG/2ML IJ SOLN: 4.0000 mg | Freq: Once | INTRAMUSCULAR | Status: AC | PRN

## 2013-04-26 MED ORDER — ONDANSETRON HCL 4 MG PO TABS: 4.0000 mg | ORAL_TABLET | Freq: Four times a day (QID) | ORAL | Status: DC | PRN

## 2013-04-26 MED ORDER — LIDOCAINE HCL (CARDIAC) 20 MG/ML IV SOLN
INTRAVENOUS | Status: DC | PRN
  Administered 2013-04-26: 50 mg via INTRAVENOUS

## 2013-04-26 MED ORDER — METOCLOPRAMIDE HCL 5 MG/ML IJ SOLN: 5.0000 mg | Freq: Three times a day (TID) | INTRAMUSCULAR | Status: DC | PRN

## 2013-04-26 MED ORDER — HYDROMORPHONE HCL PF 1 MG/ML IJ SOLN: 0.2500 mg | INTRAMUSCULAR | Status: DC | PRN

## 2013-04-26 MED ORDER — CEFAZOLIN SODIUM-DEXTROSE 2-3 GM-% IV SOLR
INTRAVENOUS | Status: DC | PRN
  Administered 2013-04-26: 2 g via INTRAVENOUS

## 2013-04-26 MED ORDER — PROPOFOL 10 MG/ML IV BOLUS
INTRAVENOUS | Status: DC | PRN
  Administered 2013-04-26: 150 mg via INTRAVENOUS

## 2013-04-26 MED ORDER — SODIUM CHLORIDE 0.9 % IV SOLN: INTRAVENOUS | Status: DC

## 2013-04-26 MED ORDER — NEPRO/CARBSTEADY PO LIQD: 237.0000 mL | ORAL | Status: DC | PRN

## 2013-04-26 MED ORDER — ALTEPLASE 2 MG IJ SOLR
2.0000 mg | Freq: Once | INTRAMUSCULAR | Status: AC | PRN
  Filled 2013-04-26: qty 2

## 2013-04-26 MED ORDER — ROCURONIUM BROMIDE 100 MG/10ML IV SOLN
INTRAVENOUS | Status: DC | PRN
  Administered 2013-04-26: 50 mg via INTRAVENOUS

## 2013-04-26 MED ORDER — HEPARIN SODIUM (PORCINE) 1000 UNIT/ML DIALYSIS
1000.0000 [IU] | INTRAMUSCULAR | Status: DC | PRN
  Filled 2013-04-26: qty 1

## 2013-04-26 MED ORDER — ONDANSETRON HCL 4 MG/2ML IJ SOLN: 4.0000 mg | Freq: Four times a day (QID) | INTRAMUSCULAR | Status: DC | PRN

## 2013-04-26 MED ORDER — LIDOCAINE HCL (PF) 1 % IJ SOLN: 5.0000 mL | INTRAMUSCULAR | Status: DC | PRN

## 2013-04-26 SURGICAL SUPPLY — 30 items
BANDAGE GAUZE ELAST BULKY 4 IN (GAUZE/BANDAGES/DRESSINGS) ×3 IMPLANT
BLADE SAW SGTL MED 73X18.5 STR (BLADE) ×3 IMPLANT
BNDG COHESIVE 4X5 TAN STRL (GAUZE/BANDAGES/DRESSINGS) ×3 IMPLANT
COVER SURGICAL LIGHT HANDLE (MISCELLANEOUS) ×3 IMPLANT
DRAPE U-SHAPE 47X51 STRL (DRAPES) ×6 IMPLANT
DRSG ADAPTIC 3X8 NADH LF (GAUZE/BANDAGES/DRESSINGS) ×3 IMPLANT
DRSG PAD ABDOMINAL 8X10 ST (GAUZE/BANDAGES/DRESSINGS) ×6 IMPLANT
DURAPREP 26ML APPLICATOR (WOUND CARE) ×3 IMPLANT
ELECT REM PT RETURN 9FT ADLT (ELECTROSURGICAL) ×3
ELECTRODE REM PT RTRN 9FT ADLT (ELECTROSURGICAL) ×1 IMPLANT
GLOVE BIOGEL PI IND STRL 9 (GLOVE) ×1 IMPLANT
GLOVE BIOGEL PI INDICATOR 9 (GLOVE) ×2
GLOVE SURG ORTHO 9.0 STRL STRW (GLOVE) ×3 IMPLANT
GOWN STRL REUS W/ TWL XL LVL3 (GOWN DISPOSABLE) ×2 IMPLANT
GOWN STRL REUS W/TWL XL LVL3 (GOWN DISPOSABLE) ×4
KIT BASIN OR (CUSTOM PROCEDURE TRAY) ×3 IMPLANT
KIT ROOM TURNOVER OR (KITS) ×3 IMPLANT
NS IRRIG 1000ML POUR BTL (IV SOLUTION) ×3 IMPLANT
PACK ORTHO EXTREMITY (CUSTOM PROCEDURE TRAY) ×3 IMPLANT
PAD ABD 8X10 STRL (GAUZE/BANDAGES/DRESSINGS) ×3 IMPLANT
PAD ARMBOARD 7.5X6 YLW CONV (MISCELLANEOUS) ×6 IMPLANT
SPONGE GAUZE 4X4 12PLY (GAUZE/BANDAGES/DRESSINGS) ×3 IMPLANT
SPONGE GAUZE 4X4 12PLY STER LF (GAUZE/BANDAGES/DRESSINGS) ×3 IMPLANT
SPONGE LAP 18X18 X RAY DECT (DISPOSABLE) ×3 IMPLANT
STOCKINETTE IMPERVIOUS LG (DRAPES) IMPLANT
SUT ETHILON 2 0 PSLX (SUTURE) ×6 IMPLANT
TOWEL OR 17X24 6PK STRL BLUE (TOWEL DISPOSABLE) ×3 IMPLANT
TOWEL OR 17X26 10 PK STRL BLUE (TOWEL DISPOSABLE) ×3 IMPLANT
UNDERPAD 30X30 INCONTINENT (UNDERPADS AND DIAPERS) ×3 IMPLANT
WATER STERILE IRR 1000ML POUR (IV SOLUTION) IMPLANT

## 2013-04-26 NOTE — Anesthesia Postprocedure Evaluation (Signed)
  Anesthesia Post-op Note  Patient: Jack [name]  Procedure(s) Performed: Procedure(s): Left Foot Transmetatarsal Amputation (Left)  Patient Location: PACU  Anesthesia Type:General  Level of Consciousness: awake, alert  and oriented  Airway and Oxygen Therapy: Patient Spontanous Breathing  Post-op Pain: none  Post-op Assessment: Post-op Vital signs reviewed, Patient's Cardiovascular Status Stable, Respiratory Function Stable, Patent Airway and Pain level controlled  Post-op Vital Signs: stable  Last Vitals:  Filed Vitals:   04/26/13 1853  BP: 128/75  Pulse: 85  Temp: 36.6 C  Resp: 22    Complications: No apparent anesthesia complications

## 2013-04-26 NOTE — Progress Notes (Signed)
Return to [place] [facility] w/ vss, recvd by RN & NT, report at bs

## 2013-04-26 NOTE — Interval H&P Note (Signed)
History and Physical Interval Note:  04/26/2013 6:04 AM  Jack [name]  has presented today for surgery, with the diagnosis of gangrene left foot  The various methods of treatment have been discussed with the patient and family. After consideration of risks, benefits and other options for treatment, the patient has consented to  Procedure(s): [place] (Left) as a surgical intervention .  The patient's history has been reviewed, patient examined, no change in status, stable for surgery.  I have reviewed the patient's chart and labs.  Questions were answered to the patient's satisfaction.     Newt Minion

## 2013-04-26 NOTE — Progress Notes (Signed)
[facility]ious [facility]      Subjective: No new complaints   Antibiotics:  Anti-infectives   Start     Dose/Rate Route Frequency Ordered Stop   04/26/13 0645  ceFAZolin (ANCEF) IVPB 2 g/50 mL premix     2 g 100 mL/hr over 30 Minutes Intravenous On call to O.R. 04/26/13 0637 04/27/13 0559   04/22/13 0600  [MAR Hold]  cefUROXime (ZINACEF) 1.5 g in dextrose 5 % 50 mL IVPB     (On MAR Hold since 04/22/13 0904)   1.5 g 100 mL/hr over 30 Minutes Intravenous On call to O.R. 04/21/13 1626 04/22/13 1015   04/19/13 1345  nafcillin 2 g in dextrose 5 % 50 mL IVPB  Status:  Discontinued     2 g 100 mL/hr over 30 Minutes Intravenous 6 times per day 04/19/13 1313 04/20/13 1518   04/19/13 1200  vancomycin (VANCOCIN) IVPB 1000 mg/200 mL premix     1,000 mg 200 mL/hr over 60 Minutes Intravenous Every M-W-F (Hemodialysis) 04/17/13 0245     04/17/13 0200  vancomycin (VANCOCIN) 2,000 mg in sodium chloride 0.9 % 500 mL IVPB     2,000 mg 250 mL/hr over 120 Minutes Intravenous  Once 04/17/13 0154 04/17/13 0627   04/17/13 0200  piperacillin-tazobactam (ZOSYN) IVPB 2.25 g  Status:  Discontinued     2.25 g 100 mL/hr over 30 Minutes Intravenous 3 times per day 04/17/13 0154 04/19/13 1312      Medications: Scheduled Meds: .  ceFAZolin (ANCEF) IV  2 g Intravenous On Call to OR  . chlorhexidine  60 mL Topical Once  . colchicine  0.3 mg Oral Daily  . doxazosin  2 mg Oral QHS  . feeding supplement (NEPRO CARB STEADY)  237 mL Oral BID BM  . heparin  5,000 Units Subcutaneous 3 times per day  . insulin aspart  0-5 Units Subcutaneous QHS  . insulin aspart  0-9 Units Subcutaneous TID WC  . levothyroxine  175 mcg Oral QAC breakfast  . nebivolol  5 mg Oral QHS  . silver sulfADIAZINE   Topical Daily  . sodium chloride  3 mL Intravenous Q12H  . vancomycin  1,000 mg Intravenous Q M,W,F-HD   Continuous Infusions: . sodium chloride 20 mL/hr at 04/22/13 1002   PRN Meds:.sodium  chloride, sodium chloride, sodium chloride, acetaminophen, acetaminophen, feeding supplement (NEPRO CARB STEADY), fentaNYL, heparin, heparin, HYDROmorphone (DILAUDID) injection, lidocaine (PF), lidocaine-prilocaine, loperamide, midazolam, ondansetron (ZOFRAN) IV, ondansetron, oxyCODONE, pentafluoroprop-tetrafluoroeth, sodium chloride    Objective: Weight change:   Intake/Output Summary (Last 24 hours) at 04/26/13 1343 Last data filed at 04/26/13 0900  Gross per 24 hour  Intake      0 ml  Output      0 ml  Net      0 ml   Blood pressure 110/64, pulse 67, temperature 98.4 F (36.9 C), temperature source Oral, resp. rate 18, height _0  (1.778 m), weight 169 lb 15.6 oz (77.1 kg), SpO2 98.00%. Temp:  [98.1 F (36.7 C)-99.2 F (37.3 C)] 98.4 F (36.9 C) (04/13 1215) Pulse Rate:  [57-69] 67 (04/13 1330) Resp:  [18-19] 18 (04/13 1215) BP: (110-153)/(59-95) 110/64 mmHg (04/13 1330) SpO2:  [95 %-99 %] 98 % (04/13 1215) Weight:  [169 lb 15.6 oz (77.1 kg)] 169 lb 15.6 oz (77.1 kg) (04/13 1215)  Physical Exam: General: Alert and awake, oriented x3, not in any acute distress.  HEENT: anicteric sclera, pupils reactive to light  and accommodation, EOMI, oropharynx clear and without exudate  CVS regular rate, normal r, faint murmur LLSB  Chest: clear to auscultation bilaterally, no wheezing, rales or rhonchi  Abdomen: Hyperactive bowel sounds soft nontender, nondistended, normal bowel sounds,  Extremities: Skin: see pictures:  Amputation site with darkening skin, sutures in place:  See sequential pictures from my last note  Wound bandaged      CBC:   Recent Labs  04/25/13 0400 04/26/13 1233  WBC 14.5* 13.9*  HGB 11.1* 11.1*  HCT 35.5* 34.8*  NA 137 138  K 3.4* 3.8  CL 92* 93*  CO2 25 24  BUN 28* 43*  CREATININE 6.91* 9.05*    BMET  Recent Labs  04/25/13 0400 04/26/13 1233  NA 137 138  K 3.4* 3.8  CL 92* 93*  CO2 25 24  GLUCOSE 92 85  BUN 28* 43*  CREATININE  6.91* 9.05*  CALCIUM 9.9 9.7     Liver Panel   Recent Labs  04/25/13 0400 04/26/13 1233  ALBUMIN 2.4* 2.5*       Sedimentation Rate No results found for this basename: ESRSEDRATE,  in the last 72 hours C-Reactive Protein No results found for this basename: CRP,  in the last 72 hours  Micro Results: Recent Results (from the past 240 hour(s))  SURGICAL PCR SCREEN     Status: None   Collection Time    04/17/13  5:10 AM      Result Value Ref Range Status   MRSA, PCR NEGATIVE  NEGATIVE Final   Staphylococcus aureus NEGATIVE  NEGATIVE Final   Comment:            The Xpert SA Assay (FDA     approved for NASAL specimens     in patients over 78 years of age),     is one component of     a comprehensive surveillance     program.  Test performance has     been validated by [employer] for patients greater     than or equal to 73 year old.     It is not intended     to diagnose infection nor to     guide or monitor treatment.  CULTURE, BLOOD (ROUTINE X 2)     Status: None   Collection Time    04/17/13  5:48 AM      Result Value Ref Range Status   Specimen Description BLOOD RIGHT HAND   Final   Special Requests BOTTLES DRAWN AEROBIC ONLY 5CC   Final   Culture  Setup Time     Final   Value: 04/17/2013 12:44     Performed at [facility]   Culture     Final   Value: METHICILLIN RESISTANT STAPHYLOCOCCUS AUREUS     Note: RIFAMPIN AND GENTAMICIN SHOULD NOT BE USED AS SINGLE DRUGS FOR TREATMENT OF STAPH INFECTIONS. This organism DOES NOT demonstrate inducible Clindamycin resistance in vitro. CRITICAL RESULT CALLED TO, READ BACK BY AND VERIFIED WITH: JAMIE COVINGTON      04/19/13 1437 BY SMITHERSJ     Note: Gram Stain Report Called to,Read Back By and Verified With: Virgilio Frees 04/18/12 @ 10:45AM BY RUSCOE A.     Performed at [facility]   Report Status 04/20/2013 FINAL   Final   Organism ID, Bacteria METHICILLIN RESISTANT STAPHYLOCOCCUS AUREUS   Final    CULTURE, BLOOD (ROUTINE X 2)     Status: None  Collection Time    04/17/13  5:50 AM      Result Value Ref Range Status   Specimen Description BLOOD RIGHT HAND   Final   Special Requests     Final   Value: BOTTLES DRAWN AEROBIC AND ANAEROBIC 10CC BLUE,5CC RED   Culture  Setup Time     Final   Value: 04/17/2013 12:44     Performed at [facility]   Culture     Final   Value: STAPHYLOCOCCUS AUREUS     Note: SUSCEPTIBILITIES PERFORMED ON PREVIOUS CULTURE WITHIN THE LAST 5 DAYS.     Note: Gram Stain Report Called to,Read Back By and Verified With: Earleen Reaper RN on 04/18/13 at 05:50 by Rise Mu     Performed at [facility]   Report Status 04/20/2013 FINAL   Final  CULTURE, BLOOD (ROUTINE X 2)     Status: None   Collection Time    04/19/13  1:04 PM      Result Value Ref Range Status   Specimen Description BLOOD RIGHT HAND   Final   Special Requests BOTTLES DRAWN AEROBIC ONLY [facility]   Final   Culture  Setup Time     Final   Value: 04/19/2013 16:06     Performed at [facility]   Culture     Final   Value: NO GROWTH 5 DAYS     Performed at [facility]   Report Status 04/25/2013 FINAL   Final  CULTURE, BLOOD (ROUTINE X 2)     Status: None   Collection Time    04/19/13  1:10 PM      Result Value Ref Range Status   Specimen Description BLOOD RIGHT ANTECUBITAL   Final   Special Requests BOTTLES DRAWN AEROBIC ONLY 5CC   Final   Culture  Setup Time     Final   Value: 04/19/2013 16:06     Performed at [facility]   Culture     Final   Value: NO GROWTH 5 DAYS     Performed at [facility]   Report Status 04/25/2013 FINAL   Final  WOUND CULTURE     Status: None   Collection Time    04/22/13 10:37 AM      Result Value Ref Range Status   Specimen Description WOUND LEFT FOOT   Final   Special Requests LEFT FIRST TOE PT ON VANCO VANCO   Final   Gram Stain     Final   Value: FEW WBC PRESENT, PREDOMINANTLY PMN     FEW SQUAMOUS  EPITHELIAL CELLS PRESENT     MODERATE GRAM POSITIVE COCCI IN CLUSTERS     MODERATE GRAM POSITIVE RODS     Performed at [facility]   Culture     Final   Value: ABUNDANT METHICILLIN RESISTANT STAPHYLOCOCCUS AUREUS     Note: RIFAMPIN AND GENTAMICIN SHOULD NOT BE USED AS SINGLE DRUGS FOR TREATMENT OF STAPH INFECTIONS. This organism DOES NOT demonstrate inducible Clindamycin resistance in vitro. CRITICAL RESULT CALLED TO, READ BACK BY AND VERIFIED WITH: ALBERT R @ 7:40AM      04/25/13 BY DWEEKS     Performed at [facility]   Report Status 04/25/2013 FINAL   Final   Organism ID, Bacteria METHICILLIN RESISTANT STAPHYLOCOCCUS AUREUS   Final  CLOSTRIDIUM DIFFICILE BY PCR     Status: None   Collection Time    04/24/13 12:25 AM  Result Value Ref Range Status   C difficile by pcr NEGATIVE  NEGATIVE Final    Studies/Results: No results found.    Assessment/Plan:  Principal Problem:   MRSA bacteremia Active Problems:   Essential hypertension, benign   Diabetes mellitus type 1, controlled, with complications   Anemia in chronic kidney disease   End stage renal disease   Peripheral vascular disease, unspecified   Diabetes mellitus   Toe gangrene   Wound infection after surgery   Fever, unspecified   Diarrhea    Jack [name] is a 68 y.o. male with  Multiple myeloma, DM, ESRD on HD via AV fistula with gangrrene in right foot sp 1st amputation readmitted for infection at that site on abx and observation but NOW WITH MRSA bacteremia.  #1  [facility] Staphylococcus aureus bacteremia   Staphylococcus aureus bacteremia (SAB) is associated with a high rate of complications and mortality. Specific aspects of clinical management are critical to optimizing the outcome of patients with SAB. Therefore, the [facility] Antimicrobial Management Team [facility]) has initiated an intervention aimed at improving the management of SAB at [facility]. To  do so, Infectious Diseases physicians are providing an evidence-based consult for the management of all patients with SAB.      Yes  No  Comments   Perform follow-up blood cultures (even if the patient is afebrile) to ensure clearance of bacteremia  _0   _1   Performed on 04/19/13 NGTD  Remove vascular catheter and obtain follow-up blood cultures after the removal of the catheter  _2   _3   No line in place today the fistula appears clean   Perform echocardiography to evaluate for endocarditis (transthoracic ECHO is 40-50% sensitive, TEE is > 90% sensitive)  _4   _5   TEE without vegetations  Consult electrophysiologist to evaluate implanted cardiac device (pacemaker, ICD)  _6   _7   NA   Ensure source control  _8   _9   Have all abscesses been drained effectively?  Have deep seeded infections (septic joints or osteomyelitis) had appropriate surgical debridement?   He underwent I and D with cultures sent  Dr Sharol Given to likely perfrom TMA today   Investigate for "metastatic" sites of infection  _10   _11   Does the patient have ANY symptom or physical exam finding that would suggest a deeper infection (back or neck pain that may be suggestive of vertebral osteomyelitis or epidural abscess, muscle pain that could be a symptom of pyomyositis)?  Keep in mind that for deep seeded infections MRI imaging with contrast is preferred rather than other often insensitive tests such as plain x-rays, especially early in a patient's presentation.  One could consider MRI brain but his TEE was clean. I DONT FEEL  Strong need for this study     Change antibiotic therapy to  vancomycin  _12   _13   Beta-lactam antibiotics are preferred for MSSA due to higher cure rates.  If on Vancomycin, goal trough should be 15 - 20 mcg/mL   Estimated duration of IV antibiotic therapy: 8 WEEKS  _14   _15   Consult case management for probably prolonged outpatient IV antibiotic therapy    #2 Amputation site: see above, VVS took to OR  followup cultures and he will likely have TMA  today  I will followup postop and arrange for HSFU in our clinic in the next 2-3 weeks     LOS: 10 days   Arvilla Meres  Dam 04/26/2013, 1:43 PM

## 2013-04-26 NOTE — Transfer of Care (Signed)
Immediate Anesthesia Transfer of Care Note  Patient: Jack [name]  Procedure(s) Performed: Procedure(s): Left Foot Transmetatarsal Amputation (Left)  Patient Location: PACU  Anesthesia Type:General  Level of Consciousness: awake and alert   Airway & Oxygen Therapy: Patient Spontanous Breathing and Patient connected to nasal cannula oxygen  Post-op Assessment: Report given to PACU RN and Post -op Vital signs reviewed and stable  Post vital signs: Reviewed  Complications: No apparent anesthesia complications

## 2013-04-26 NOTE — Anesthesia Procedure Notes (Signed)
Procedure Name: LMA Insertion Date/Time: 04/26/2013 5:42 PM Performed by: Eligha Bridegroom Pre-anesthesia Checklist: Patient identified, Timeout performed, Emergency Drugs available, Suction available and Patient being monitored Patient Re-evaluated:Patient Re-evaluated prior to inductionOxygen Delivery Method: Circle system utilized Preoxygenation: Pre-oxygenation with 100% oxygen Intubation Type: IV induction Ventilation: Mask ventilation without difficulty LMA: LMA inserted LMA Size: 4.0 Placement Confirmation: positive ETCO2 and breath sounds checked- equal and bilateral Tube secured with: Tape Dental Injury: Teeth and Oropharynx as per pre-operative assessment

## 2013-04-26 NOTE — Progress Notes (Signed)
ANTIBIOTIC CONSULT NOTE - FOLLOW UP  Pharmacy Consult for Vancomycin Indication: MRSA bacteremia  Allergies  Allergen Reactions  . Lisinopril Cough  . Ivp Dye [Iodinated Diagnostic Agents] Hives  . Morphine And Related Other (See Comments)    Bradycardia states patient    Patient Measurements: Height: 5\' 10"  (177.8 cm) Weight: 169 lb 15.6 oz (77.1 kg) IBW/kg (Calculated) : 73  Vital Signs: Temp: 98.4 F (36.9 C) (04/13 1215) Temp src: Oral (04/13 1215) BP: 110/64 mmHg (04/13 1330) Pulse Rate: 67 (04/13 1330) Intake/Output from previous day: 04/12 0701 - 04/13 0700 In: 240 [P.O.:240] Out: -  Intake/Output from this shift:    Labs:  Recent Labs  04/24/13 0700 04/24/13 0706 04/25/13 0400 04/26/13 1233  WBC  --  13.6* 14.5* 13.9*  HGB  --  11.2* 11.1* 11.1*  PLT  --  266 298 334  CREATININE 5.41*  --  6.91* 9.05*   Estimated Creatinine Clearance: 8 ml/min (by C-G formula based on Cr of 9.05).  Recent Labs  04/23/13 1430  VANCORANDOM 17.1     Microbiology: Recent Results (from the past 720 hour(s))  SURGICAL PCR SCREEN     Status: None   Collection Time    04/17/13  5:10 AM      Result Value Ref Range Status   MRSA, PCR NEGATIVE  NEGATIVE Final   Staphylococcus aureus NEGATIVE  NEGATIVE Final   Comment:            The Xpert SA Assay (FDA     approved for NASAL specimens     in patients over 68 years of age),     is one component of     a comprehensive surveillance     program.  Test performance has     been validated by [employer] for patients greater     than or equal to 76 year old.     It is not intended     to diagnose infection nor to     guide or monitor treatment.  CULTURE, BLOOD (ROUTINE X 2)     Status: None   Collection Time    04/17/13  5:48 AM      Result Value Ref Range Status   Specimen Description BLOOD RIGHT HAND   Final   Special Requests BOTTLES DRAWN AEROBIC ONLY 5CC   Final   Culture  Setup Time     Final   Value:  04/17/2013 12:44     Performed at [facility]   Culture     Final   Value: METHICILLIN RESISTANT STAPHYLOCOCCUS AUREUS     Note: RIFAMPIN AND GENTAMICIN SHOULD NOT BE USED AS SINGLE DRUGS FOR TREATMENT OF STAPH INFECTIONS. This organism DOES NOT demonstrate inducible Clindamycin resistance in vitro. CRITICAL RESULT CALLED TO, READ BACK BY AND VERIFIED WITH: JAMIE COVINGTON      04/19/13 1437 BY SMITHERSJ     Note: Gram Stain Report Called to,Read Back By and Verified With: Virgilio Frees 04/18/12 @ 10:45AM BY RUSCOE A.     Performed at [facility]   Report Status 04/20/2013 FINAL   Final   Organism ID, Bacteria METHICILLIN RESISTANT STAPHYLOCOCCUS AUREUS   Final  CULTURE, BLOOD (ROUTINE X 2)     Status: None   Collection Time    04/17/13  5:50 AM      Result Value Ref Range Status   Specimen Description BLOOD RIGHT HAND   Final  Special Requests     Final   Value: BOTTLES DRAWN AEROBIC AND ANAEROBIC 10CC BLUE,5CC RED   Culture  Setup Time     Final   Value: 04/17/2013 12:44     Performed at [facility]   Culture     Final   Value: STAPHYLOCOCCUS AUREUS     Note: SUSCEPTIBILITIES PERFORMED ON PREVIOUS CULTURE WITHIN THE LAST 5 DAYS.     Note: Gram Stain Report Called to,Read Back By and Verified With: Earleen Reaper RN on 04/18/13 at 05:50 by Rise Mu     Performed at [facility]   Report Status 04/20/2013 FINAL   Final  CULTURE, BLOOD (ROUTINE X 2)     Status: None   Collection Time    04/19/13  1:04 PM      Result Value Ref Range Status   Specimen Description BLOOD RIGHT HAND   Final   Special Requests BOTTLES DRAWN AEROBIC ONLY [facility]   Final   Culture  Setup Time     Final   Value: 04/19/2013 16:06     Performed at [facility]   Culture     Final   Value: NO GROWTH 5 DAYS     Performed at [facility]   Report Status 04/25/2013 FINAL   Final  CULTURE, BLOOD (ROUTINE X 2)     Status: None   Collection Time    04/19/13  1:10 PM       Result Value Ref Range Status   Specimen Description BLOOD RIGHT ANTECUBITAL   Final   Special Requests BOTTLES DRAWN AEROBIC ONLY 5CC   Final   Culture  Setup Time     Final   Value: 04/19/2013 16:06     Performed at [facility]   Culture     Final   Value: NO GROWTH 5 DAYS     Performed at [facility]   Report Status 04/25/2013 FINAL   Final  WOUND CULTURE     Status: None   Collection Time    04/22/13 10:37 AM      Result Value Ref Range Status   Specimen Description WOUND LEFT FOOT   Final   Special Requests LEFT FIRST TOE PT ON VANCO VANCO   Final   Gram Stain     Final   Value: FEW WBC PRESENT, PREDOMINANTLY PMN     FEW SQUAMOUS EPITHELIAL CELLS PRESENT     MODERATE GRAM POSITIVE COCCI IN CLUSTERS     MODERATE GRAM POSITIVE RODS     Performed at [facility]   Culture     Final   Value: ABUNDANT METHICILLIN RESISTANT STAPHYLOCOCCUS AUREUS     Note: RIFAMPIN AND GENTAMICIN SHOULD NOT BE USED AS SINGLE DRUGS FOR TREATMENT OF STAPH INFECTIONS. This organism DOES NOT demonstrate inducible Clindamycin resistance in vitro. CRITICAL RESULT CALLED TO, READ BACK BY AND VERIFIED WITH: ALBERT R @ 7:40AM      04/25/13 BY DWEEKS     Performed at [facility]   Report Status 04/25/2013 FINAL   Final   Organism ID, Bacteria METHICILLIN RESISTANT STAPHYLOCOCCUS AUREUS   Final  CLOSTRIDIUM DIFFICILE BY PCR     Status: None   Collection Time    04/24/13 12:25 AM      Result Value Ref Range Status   C difficile by pcr NEGATIVE  NEGATIVE Final    Anti-infectives   Start     Dose/Rate Route Frequency  Ordered Stop   04/26/13 0645  ceFAZolin (ANCEF) IVPB 2 g/50 mL premix     2 g 100 mL/hr over 30 Minutes Intravenous On call to O.R. 04/26/13 0637 04/27/13 0559   04/22/13 0600  [MAR Hold]  cefUROXime (ZINACEF) 1.5 g in dextrose 5 % 50 mL IVPB     (On MAR Hold since 04/22/13 0904)   1.5 g 100 mL/hr over 30 Minutes Intravenous On call to O.R. 04/21/13 1626  04/22/13 1015   04/19/13 1345  nafcillin 2 g in dextrose 5 % 50 mL IVPB  Status:  Discontinued     2 g 100 mL/hr over 30 Minutes Intravenous 6 times per day 04/19/13 1313 04/20/13 1518   04/19/13 1200  vancomycin (VANCOCIN) IVPB 1000 mg/200 mL premix     1,000 mg 200 mL/hr over 60 Minutes Intravenous Every M-W-F (Hemodialysis) 04/17/13 0245     04/17/13 0200  vancomycin (VANCOCIN) 2,000 mg in sodium chloride 0.9 % 500 mL IVPB     2,000 mg 250 mL/hr over 120 Minutes Intravenous  Once 04/17/13 0154 04/17/13 0627   04/17/13 0200  piperacillin-tazobactam (ZOSYN) IVPB 2.25 g  Status:  Discontinued     2.25 g 100 mL/hr over 30 Minutes Intravenous 3 times per day 04/17/13 0154 04/19/13 1312      Assessment: 68 y.o. M with ESRD who continues on Vancomycin for MRSA bacteremia thought to be due to a L-foot infection, s/p I&D on 4/9. TEE negative for IE. ID on board and want to treat with Vancomycin for 8 weeks. Plan is for transmet amputation today. HD may be delayed until tomorrow in which case patient will be off schedule.  Goal of Therapy:  Pre-HD Vancomycin level of 15-25 mcg/ml  Plan:  1. Continue Vancomcyin 1g IV post HD sessions on MWF 2. Will continue to follow HD schedule/duration for appropriateness of Vancomycin doses  [facility], Pharm.D., BCPS Clinical Pharmacist Pager: [phone] 04/26/2013 2:17 PM

## 2013-04-26 NOTE — Progress Notes (Signed)
[facility] PROGRESS NOTE  KAYLER RISE MVH:[id number] DOB: 01/11/1946 DOA: 04/16/2013 PCP: [name], MD   Interim History:  [name]. He has a very complex medical history with diabetic foot infection. He was found to have gangrene of his left great toe. 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 He underwent amputation of his left great toe by Dr. Kellie Simmering on 04/07/2013 with subsequent I and D on 04/22/13 per Dr Kellie Simmering.       Assessment/Plan: #1 MRSA bacteremia Likely seeded from wound infection. When cultures growing abundant staph aureus with sensitivities pending. MRI of the foot negative for abscess or osteomyelitis TEE 04/20/2013 was negative for SBE with ejection fraction of 65%. Patient currently afebrile.white count is 14.5. Patient will likely need 8 weeks of IV antibiotics. Infectious disease is following and appreciate input and recommendations.  #2 poorly healing left first toe amputation Patient with a gangrenous foot. Patient is status post I&D of left first toe amputation wound per Dr. Kellie Simmering yesterday. Patient was seen by Dr. Sharol Given in consultation, and patient for possible revision surgery/possible transmetatarsal amputation this evening. Continue IV antibiotics. Per Vascular surgery.  #3 diabetes mellitus type 1 Stable. Continue sliding scale insulin.  #4 hypertension Stable. Continue Cardura,bystolic.  #5 hypothyroidism Continue Synthroid.  #6 end-stage renal disease on hemodialysis Patient has dialysis Monday Wednesdays and Fridays. Nephrology is following. Per renal.  #7 anemia of chronic disease Likely secondary to problem #6. H&H stable follow.  #8 diarrhea C. difficile PCR is negative. May be secondary to Nephro-Vite. Patient refusing Nephro-Vite as he feels this maybe causing his diarrhea.  Imodium as needed.  Code Status: full Family Communication: updated patient. Disposition Plan: home  versus SNF when medically stable.   Consultants:  Infectious diseases: Dr. Lucianne Lei dam 04/19/2013  Nephrology: Dr. Posey Pronto 04/17/2013  Vascular surgery Dr. Donnetta Hutching 04/17/2013  Orthopedics: Dr. Sharol Given 04/23/2013  Procedures:  I and D left first toe amputation wound 04/22/2013 per Dr. Kellie Simmering  MRI left foot 04/18/2013  MRI left ankle 04/17/2013  TEE 04/20/2013  Antibiotics:  IV vancomycin 4/4/ 2015  IV Zosyn 04/17/2013 - 04/19/2013  IV nafcillin 04/19/2013 - 04/20/2013  HPI/Subjective: Patient states diarrhea has improved. Patient states left foot is feeling better. Patient anxious to get procedure done on Monday.  Objective: Filed Vitals:   04/26/13 1000  BP: 150/72  Pulse: 62  Temp: 98.7 F (37.1 C)  Resp: 18    Intake/Output Summary (Last 24 hours) at 04/26/13 1131 Last data filed at 04/26/13 0900  Gross per 24 hour  Intake      0 ml  Output      0 ml  Net      0 ml   Filed Weights   04/23/13 1412 04/23/13 1802 04/23/13 2055  Weight: 79.9 kg (176 lb 2.4 oz) 77.5 kg (170 lb 13.7 oz) 77.8 kg (171 lb 8.3 oz)    Exam:   General:  NAD  Cardiovascular: RRR  Respiratory: CTAB  Abdomen: SOFT, NONTENDER, NONDISTENDED, POSITIVE BOWEL SOUNDS.  Musculoskeletal: NO CLUBBING CYANOSIS OR EDEMA. lEFT FOOT WRAPPED STATUS POST i&d.  Data Reviewed: Basic Metabolic Panel:  Recent Labs Lab 04/19/13 1501 04/21/13 1608 04/22/13 0725 04/23/13 1156 04/24/13 0700 04/25/13 0400  NA 141 138 138 137 139 137  K 4.0 3.3* 3.8 3.2* 3.6* 3.4*  CL 92* 95* 93* 93* 94* 92*  CO2 24 25 23 25 27 25   GLUCOSE 135* 117* 128* 174* 146* 92  BUN 54* 44* 28*  38* 19 28*  CREATININE 9.47* 7.84* 5.75* 7.63* 5.41* 6.91*  CALCIUM 9.1 9.2 9.7 9.5 9.7 9.9  PHOS 5.7* 3.6  --  4.9* 4.1 5.3*   Liver Function Tests:  Recent Labs Lab 04/19/13 1501 04/21/13 1608 04/23/13 1156 04/24/13 0700 04/25/13 0400  ALBUMIN 2.2* 2.2* 2.2* 2.5* 2.4*   No results found for this basename: LIPASE,  AMYLASE,  in the last 168 hours No results found for this basename: AMMONIA,  in the last 168 hours CBC:  Recent Labs Lab 04/19/13 1310  04/21/13 1608 04/22/13 0725 04/23/13 1156 04/24/13 0706 04/25/13 0400  WBC 10.9*  < > 10.8* 12.0* 12.9* 13.6* 14.5*  NEUTROABS 8.0*  --   --   --   --   --   --   HGB 9.8*  < > 10.9* 12.1* 11.0* 11.2* 11.1*  HCT 31.4*  < > 34.5* 39.1 35.0* 35.8* 35.5*  MCV 85.3  < > 83.7 86.5 85.2 85.0 86.0  PLT 210  < > 241 240 256 266 298  < > = values in this interval not displayed. Cardiac Enzymes: No results found for this basename: CKTOTAL, CKMB, CKMBINDEX, TROPONINI,  in the last 168 hours BNP (last 3 results) No results found for this basename: PROBNP,  in the last 8760 hours CBG:  Recent Labs Lab 04/25/13 0753 04/25/13 1226 04/25/13 1715 04/25/13 2152 04/26/13 0733  GLUCAP 97 139* 118* 136* 134*    Recent Results (from the past 240 hour(s))  SURGICAL PCR SCREEN     Status: None   Collection Time    04/17/13  5:10 AM      Result Value Ref Range Status   MRSA, PCR NEGATIVE  NEGATIVE Final   Staphylococcus aureus NEGATIVE  NEGATIVE Final   Comment:            The Xpert SA Assay (FDA     approved for NASAL specimens     in patients over 77 years of age),     is one component of     a comprehensive surveillance     program.  Test performance has     been validated by [employer] for patients greater     than or equal to 73 year old.     It is not intended     to diagnose infection nor to     guide or monitor treatment.  CULTURE, BLOOD (ROUTINE X 2)     Status: None   Collection Time    04/17/13  5:48 AM      Result Value Ref Range Status   Specimen Description BLOOD RIGHT HAND   Final   Special Requests BOTTLES DRAWN AEROBIC ONLY 5CC   Final   Culture  Setup Time     Final   Value: 04/17/2013 12:44     Performed at [facility]   Culture     Final   Value: METHICILLIN RESISTANT STAPHYLOCOCCUS AUREUS     Note: RIFAMPIN  AND GENTAMICIN SHOULD NOT BE USED AS SINGLE DRUGS FOR TREATMENT OF STAPH INFECTIONS. This organism DOES NOT demonstrate inducible Clindamycin resistance in vitro. CRITICAL RESULT CALLED TO, READ BACK BY AND VERIFIED WITH: JAMIE COVINGTON      04/19/13 1437 BY SMITHERSJ     Note: Gram Stain Report Called to,Read Back By and Verified With: Virgilio Frees 04/18/12 @ 10:45AM BY RUSCOE A.     Performed at [facility]   Report Status  04/20/2013 FINAL   Final   Organism ID, Bacteria METHICILLIN RESISTANT STAPHYLOCOCCUS AUREUS   Final  CULTURE, BLOOD (ROUTINE X 2)     Status: None   Collection Time    04/17/13  5:50 AM      Result Value Ref Range Status   Specimen Description BLOOD RIGHT HAND   Final   Special Requests     Final   Value: BOTTLES DRAWN AEROBIC AND ANAEROBIC 10CC BLUE,5CC RED   Culture  Setup Time     Final   Value: 04/17/2013 12:44     Performed at [facility]   Culture     Final   Value: STAPHYLOCOCCUS AUREUS     Note: SUSCEPTIBILITIES PERFORMED ON PREVIOUS CULTURE WITHIN THE LAST 5 DAYS.     Note: Gram Stain Report Called to,Read Back By and Verified With: Earleen Reaper RN on 04/18/13 at 05:50 by Rise Mu     Performed at [facility]   Report Status 04/20/2013 FINAL   Final  CULTURE, BLOOD (ROUTINE X 2)     Status: None   Collection Time    04/19/13  1:04 PM      Result Value Ref Range Status   Specimen Description BLOOD RIGHT HAND   Final   Special Requests BOTTLES DRAWN AEROBIC ONLY [facility]   Final   Culture  Setup Time     Final   Value: 04/19/2013 16:06     Performed at [facility]   Culture     Final   Value: NO GROWTH 5 DAYS     Performed at [facility]   Report Status 04/25/2013 FINAL   Final  CULTURE, BLOOD (ROUTINE X 2)     Status: None   Collection Time    04/19/13  1:10 PM      Result Value Ref Range Status   Specimen Description BLOOD RIGHT ANTECUBITAL   Final   Special Requests BOTTLES DRAWN AEROBIC ONLY 5CC   Final    Culture  Setup Time     Final   Value: 04/19/2013 16:06     Performed at [facility]   Culture     Final   Value: NO GROWTH 5 DAYS     Performed at [facility]   Report Status 04/25/2013 FINAL   Final  WOUND CULTURE     Status: None   Collection Time    04/22/13 10:37 AM      Result Value Ref Range Status   Specimen Description WOUND LEFT FOOT   Final   Special Requests LEFT FIRST TOE PT ON VANCO VANCO   Final   Gram Stain     Final   Value: FEW WBC PRESENT, PREDOMINANTLY PMN     FEW SQUAMOUS EPITHELIAL CELLS PRESENT     MODERATE GRAM POSITIVE COCCI IN CLUSTERS     MODERATE GRAM POSITIVE RODS     Performed at [facility]   Culture     Final   Value: ABUNDANT METHICILLIN RESISTANT STAPHYLOCOCCUS AUREUS     Note: RIFAMPIN AND GENTAMICIN SHOULD NOT BE USED AS SINGLE DRUGS FOR TREATMENT OF STAPH INFECTIONS. This organism DOES NOT demonstrate inducible Clindamycin resistance in vitro. CRITICAL RESULT CALLED TO, READ BACK BY AND VERIFIED WITH: ALBERT R @ 7:40AM      04/25/13 BY DWEEKS     Performed at [facility]   Report Status 04/25/2013 FINAL   Final   Organism ID, Bacteria  METHICILLIN RESISTANT STAPHYLOCOCCUS AUREUS   Final  CLOSTRIDIUM DIFFICILE BY PCR     Status: None   Collection Time    04/24/13 12:25 AM      Result Value Ref Range Status   C difficile by pcr NEGATIVE  NEGATIVE Final     Studies: No results found.  Scheduled Meds: .  ceFAZolin (ANCEF) IV  2 g Intravenous On Call to OR  . chlorhexidine  60 mL Topical Once  . colchicine  0.3 mg Oral Daily  . doxazosin  2 mg Oral QHS  . feeding supplement (NEPRO CARB STEADY)  237 mL Oral BID BM  . heparin  5,000 Units Subcutaneous 3 times per day  . insulin aspart  0-5 Units Subcutaneous QHS  . insulin aspart  0-9 Units Subcutaneous TID WC  . levothyroxine  175 mcg Oral QAC breakfast  . nebivolol  5 mg Oral QHS  . silver sulfADIAZINE   Topical Daily  . sodium chloride  3 mL  Intravenous Q12H  . vancomycin  1,000 mg Intravenous Q M,W,F-HD   Continuous Infusions: . sodium chloride 20 mL/hr at 04/22/13 1002    Principal Problem:   MRSA bacteremia Active Problems:   Essential hypertension, benign   Diabetes mellitus type 1, controlled, with complications   Anemia in chronic kidney disease   End stage renal disease   Peripheral vascular disease, unspecified   Diabetes mellitus   Toe gangrene   Wound infection after surgery   Fever, unspecified   Diarrhea    Time spent: 35 minutes    Eugenie Filler M.D. [facility] Pager [phone]. If 7PM-7AM, please contact night-coverage at [email or web address], password [facility] 04/26/2013, 11:31 AM  LOS: 10 days

## 2013-04-26 NOTE — H&P (View-Only) (Signed)
Patient ID: Jack [name], male   DOB: 12/28/1945, 67 y.o.   MRN: [id number] Examination of left foot there is no progressive in of the ischemic changes. Ischemia is limited to the first and second ray. His mid foot feels warm.  Will plan for left foot first and second ray amputation on Monday a proximally 5 PM  I would recommend holding dialysis on Monday and proceeding with dialysis on Tuesday if possible. The combination of dialysis and surgery on the same day may be additional stress for the patient. 

## 2013-04-26 NOTE — Anesthesia Preprocedure Evaluation (Addendum)
Anesthesia Evaluation  Patient identified by MRN, date of birth, ID band Patient awake    Reviewed: Allergy & Precautions, H&P , NPO status , Patient's Chart, lab work & pertinent test results  Airway Mallampati: II      Dental   Pulmonary shortness of breath, asthma , former smoker,          Cardiovascular hypertension, + Peripheral Vascular Disease     Neuro/Psych    GI/Hepatic   Endo/Other  diabetes, Type 2, Oral Hypoglycemic AgentsHypothyroidism   Renal/GU CRFRenal disease     Musculoskeletal   Abdominal   Peds  Hematology  (+) Blood dyscrasia, anemia ,   Anesthesia Other Findings   Reproductive/Obstetrics                         Anesthesia Physical Anesthesia Plan  ASA: III  Anesthesia Plan: General   Post-op Pain Management:    Induction: Intravenous  Airway Management Planned: LMA  Additional Equipment:   Intra-op Plan:   Post-operative Plan: Extubation in OR  Informed Consent: I have reviewed the patients History and Physical, chart, labs and discussed the procedure including the risks, benefits and alternatives for the proposed anesthesia with the patient or authorized representative who has indicated his/her understanding and acceptance.   Dental advisory given  Plan Discussed with: CRNA, Anesthesiologist and Surgeon  Anesthesia Plan Comments:        Anesthesia Quick Evaluation

## 2013-04-26 NOTE — Progress Notes (Signed)
Subjective:   No complaints, NPO for surgery today  Objective: Vital signs in last 24 hours: Temp:  [98.1 F (36.7 C)-99.2 F (37.3 C)] 98.1 F (36.7 C) (04/13 0500) Pulse Rate:  [62-65] 63 (04/13 0500) Resp:  [18-19] 18 (04/13 0500) BP: (128-153)/(62-88) 136/70 mmHg (04/13 0500) SpO2:  [97 %-100 %] 99 % (04/13 0500) Weight change:   Intake/Output from previous day: 04/12 0701 - 04/13 0700 In: 240 [P.O.:240] Out: -    Lab Results:  Recent Labs  04/24/13 0706 04/25/13 0400  WBC 13.6* 14.5*  HGB 11.2* 11.1*  HCT 35.8* 35.5*  PLT 266 298   BMET:  Recent Labs  04/24/13 0700 04/25/13 0400  NA 139 137  K 3.6* 3.4*  CL 94* 92*  CO2 27 25  GLUCOSE 146* 92  BUN 19 28*  CREATININE 5.41* 6.91*  CALCIUM 9.7 9.9  ALBUMIN 2.5* 2.4*   No results found for this basename: PTH,  in the last 72 hours Iron Studies: No results found for this basename: IRON, TIBC, TRANSFERRIN, FERRITIN,  in the last 72 hours  Studies/Results: No results found.  EXAM:  General appearance: Alert, in no apparent distress  Resp: CTA without rales, rhonchi, or wheezes  Cardio: RRR with Gr II/VI systolic murmur, no rub  GI: + BS, soft and nontender  Extremities: No edema, R TMA well healed; L foot with heavy dressing  Access: AVF @ LUA with + bruit   D[facility]  4h 400/800 85.5kg Heparin 8700 LUA AVF  Hectorol 1 EPO none   Assessment/Plan: 1. Wound infection left foot / MRSA bacteremia - s/p L great toe amputation 3/25, irrigation & debridement 4/9 per Dr. Kellie Simmering, wound culture w/ Gr + cocci & rods, on Vancomycin; TEE 4/7 showed moderate LVH, EF 65%, no vegetation. L 1st & 2nd transmet amputation per Dr. Sharol Given today. 2. ESRD - HD on MWF @ [place], K 3.4. HD pending.  3. HTN/Volume - BP 153/69 on Doxazosin 2 mg qhs, Bystolic 5 mg qhs; below EDW, no signs or symptoms of fluid overload. 4. Anemia - Hgb 11.1, stable.  5. Sec HPT - Ca 9.9 (11.2 corrected), P 5,3; Hectorol 1 mcg, no binders,  2Ca bath. Hold Hectorol. 6. Nutrition - Alb 2.4, carb-mod renal diet, multivitamin, supplement.  7. DM - insulin per primary.    LOS: 10 days   Ramiro Harvest 04/26/2013,7:09 AM I have seen and examined this patient and agree with plan per Ramiro Harvest.  Pt seen on HD Ap 210 Vp 240, BFR 400.  To go for transmet after HD today. Bennie Dallas 04/26/2013 1:07 PM

## 2013-04-26 NOTE — Op Note (Signed)
OPERATIVE REPORT  DATE OF SURGERY: 04/26/2013  PATIENT:  Porfirio Oar,  69 y.o. male  PRE-OPERATIVE DIAGNOSIS:  gangrene left foot  POST-OPERATIVE DIAGNOSIS:  gangrene left foot  PROCEDURE:  Procedure(s): Left Foot Transmetatarsal Amputation  SURGEON:  Surgeon(s): Newt Minion, MD  ANESTHESIA:   general  EBL:  min ML  SPECIMEN:  Source of Specimen:  Left forefoot  TOURNIQUET:  * No tourniquets in log *  PROCEDURE DETAILS: Patient is a 68 year old gentleman status post revascularization for the left lower extremity. Patient is status post great toe amputation has dry gangrene of the great toe wound as well as dry  gangrene of the second toe. Patient presents at this time for foot salvage for amputation of the first and second ray. Risks and benefits were discussed including infection nonhealing wound need for additional surgery. Patient states he understands and wished to proceed at this time. Distraction of procedure patient was brought to the operating room and underwent a general anesthetic. After adequate levels of anesthesia were obtained patient's left lower extremity was prepped using DuraPrep draped into a sterile field. A racquet incision was made around the first and second ray. Tissue beneath the lateral column was completely ischemic and necrotic. Patient's surgery was then transitioned to a transmetatarsal amputation for foot salvage. With a proximal transmetatarsal amputation performed patient did start having petechial bleeding on the skin edges. The incision was closed without tension the skin with 2-0 nylon after irrigation and debridement. The wound was covered with Adaptic orthopedic sponges AB dressing Kerlix and Coban. Patient was extubated taken to the PACU in stable condition.  PLAN OF CARE: Admit to inpatient   PATIENT DISPOSITION:  PACU - hemodynamically stable.   Newt Minion, MD 04/26/2013 6:08 PM

## 2013-04-27 ENCOUNTER — Ambulatory Visit: Payer: Medicare Other | Admitting: Family

## 2013-04-27 ENCOUNTER — Encounter (HOSPITAL_COMMUNITY): Payer: Self-pay | Admitting: Orthopedic Surgery

## 2013-04-27 LAB — RENAL FUNCTION PANEL
ALBUMIN: 2.4 g/dL — AB (ref 3.5–5.2)
BUN: 28 mg/dL — ABNORMAL HIGH (ref 6–23)
CO2: 21 mEq/L (ref 19–32)
CREATININE: 6.27 mg/dL — AB (ref 0.50–1.35)
Calcium: 9.3 mg/dL (ref 8.4–10.5)
Chloride: 92 mEq/L — ABNORMAL LOW (ref 96–112)
GFR calc Af Amer: 9 mL/min — ABNORMAL LOW (ref >90)
GFR calc non Af Amer: 8 mL/min — ABNORMAL LOW (ref >90)
Glucose, Bld: 99 mg/dL (ref 70–99)
PHOSPHORUS: 5.6 mg/dL — AB (ref 2.3–4.6)
Potassium: 4.1 mEq/L (ref 3.7–5.3)
SODIUM: 136 meq/L — AB (ref 137–147)

## 2013-04-27 LAB — CBC
HCT: 36.8 % — ABNORMAL LOW (ref 39.0–52.0)
Hemoglobin: 11.4 g/dL — ABNORMAL LOW (ref 13.0–17.0)
MCH: 26.5 pg (ref 26.0–34.0)
MCHC: 31 g/dL (ref 30.0–36.0)
MCV: 85.6 fL (ref 78.0–100.0)
Platelets: 260 10*3/uL (ref 150–400)
RBC: 4.3 MIL/uL (ref 4.22–5.81)
RDW: 15 % (ref 11.5–15.5)
WBC: 13.7 10*3/uL — ABNORMAL HIGH (ref 4.0–10.5)

## 2013-04-27 LAB — GLUCOSE, CAPILLARY
Glucose-Capillary: 108 mg/dL — ABNORMAL HIGH (ref 70–99)
Glucose-Capillary: 179 mg/dL — ABNORMAL HIGH (ref 70–99)
Glucose-Capillary: 131 mg/dL — ABNORMAL HIGH (ref 70–99)
Glucose-Capillary: 111 mg/dL — ABNORMAL HIGH (ref 70–99)

## 2013-04-27 MED ORDER — COLLAGENASE 250 UNIT/GM EX OINT
TOPICAL_OINTMENT | Freq: Every day | CUTANEOUS | Status: DC
  Administered 2013-04-27 – 2013-04-29 (×3): via TOPICAL
  Filled 2013-04-27: qty 30

## 2013-04-27 NOTE — Progress Notes (Signed)
Subjective:  No complaints s/p surgery yesterday afternoon, pain controlled  Objective: Vital signs in last 24 hours: Temp:  [97.8 F (36.6 C)-98.7 F (37.1 C)] 98.5 F (36.9 C) (04/14 0528) Pulse Rate:  [57-93] 65 (04/14 0528) Resp:  [18-24] 20 (04/14 0528) BP: (86-150)/(59-95) 121/73 mmHg (04/14 0528) SpO2:  [95 %-100 %] 98 % (04/14 0528) Weight:  [74.844 kg (165 lb)-77.1 kg (169 lb 15.6 oz)] 74.844 kg (165 lb) (04/13 2142) Weight change:   Intake/Output from previous day: 04/13 0701 - 04/14 0700 In: 375 [I.V.:375] Out: 1507    Lab Results:  Recent Labs  04/26/13 1233 04/27/13 0620  WBC 13.9* 13.7*  HGB 11.1* 11.4*  HCT 34.8* 36.8*  PLT 334 260   BMET:  Recent Labs  04/25/13 0400 04/26/13 1233  NA 137 138  K 3.4* 3.8  CL 92* 93*  CO2 25 24  GLUCOSE 92 85  BUN 28* 43*  CREATININE 6.91* 9.05*  CALCIUM 9.9 9.7  ALBUMIN 2.4* 2.5*   No results found for this basename: PTH,  in the last 72 hours Iron Studies: No results found for this basename: IRON, TIBC, TRANSFERRIN, FERRITIN,  in the last 72 hours  Studies/Results: No results found.  EXAM:  General appearance: Alert, in no apparent distress  Resp: CTA without rales, rhonchi, or wheezes  Cardio: RRR with Gr II/VI systolic murmur, no rub  GI: + BS, soft and nontender  Extremities: No edema, R TMA well healed; L transmet amp with heavy dressing  Access: AVF @ LUA with + bruit   D[facility]  4h 400/800 85.5kg Heparin 8700 LUA AVF  Hectorol 1 EPO none   Assessment/Plan: 1. Wound infection left foot / MRSA bacteremia - s/p L great toe amputation 3/25, irrigation & debridement 4/9 per Dr. Kellie Simmering, wound culture + for MRSA, on Vancomycin; TEE 4/7 showed moderate LVH, EF 65%, no vegetation; L transmet amputation per Dr. Sharol Given yesterday.  2. ESRD - HD on MWF @ [place], K 3.8.  Next HD tomorrow.  3. HTN/Volume - BP 121/73 on Doxazosin 2 mg qhs, Bystolic 5 mg qhs; wt 35.5 kg s/p net UF 1.5 L yesterday, below  EDW.  4. Anemia - Hgb 11.4, stable.  5. Sec HPT - Ca 9.7 (10.9 corrected), P 6.3; 2Ca bath, Hectorol 1 mcg on hold sec to high Ca, no binders.   6. Nutrition - Alb 2.5, carb-mod renal diet, multivitamin, supplement.  7. DM - insulin per primary.     LOS: 11 days   Ramiro Harvest 04/27/2013,7:20 AM I have seen and examined this patient and agree with plan per Ramiro Harvest.  SP Lt transmet last night.  Eating [employer] biscuit.  Had low BP with HD yest, will pull less fluid tomorrow.  I don't think he needs both cardura and bystolic.  Will Dc cardura.Alphonse Guild Fairy Ashlock,MD 04/27/2013 9:02 AM

## 2013-04-27 NOTE — Clinical Social Work Psychosocial (Signed)
Clinical Social Work Department BRIEF PSYCHOSOCIAL ASSESSMENT 04/27/2013  Patient:  Jack [name], Jack [name]     Account Number:  [id number]     Admit date:  04/16/2013  Clinical Social Worker:  Frederico Hamman  Date/Time:  04/27/2013 04:07 PM  Referred by:  Physician  Date Referred:  04/26/2013 Referred for  SNF Placement   Other Referral:   Interview type:  Patient Other interview type:   Patient has a daughter, Jack [name] [phone].    PSYCHOSOCIAL DATA Living Status:  ALONE Admitted from facility:   Level of care:   Primary support name:  Jack [name] Primary support relationship to patient:  CHILD, ADULT Degree of support available:    CURRENT CONCERNS Current Concerns  Post-Acute Placement   Other Concerns:    SOCIAL WORK ASSESSMENT / PLAN CSW intern introduce self to patient. Patient was alert and open to talking with CSW intern. Patient was talking with a friend of the family when Jack [name] intern walked in. CSW intern introduced to the patient about discharge plans and short term rehab. Patient is agreeable and has never been to a short term rehab facility but has had wound care. Patient would like family friend Jack [name] to suggest a facility for him. Patient allowed CSW intern to call his daughter, Jack [name]. Jack [name] seem pleasant and was hoping the patient could come home. Jack [name] will get in contact with Jack [name] to suggest a facility. Jack [name] has CSW's contact information. Patient has CSW's contact information and a list of SNF in his room. Patient has no other needs at the current time.   Assessment/plan status:  Psychosocial Support/Ongoing Assessment of Needs Other assessment/ plan:   Information/referral to community resources:   SNF.    PATIENT'S/FAMILY'S RESPONSE TO PLAN OF CARE: Patient was sweet and happy to talk with CSW intern. Patient advised CSW intern that he felt good. Jack [name], the patient's daughter seem sad about the patient not returning home. CSW intern  advised to Jack [name] that, her father would only be in facility for a  short time. Jack [name] and the patient has CSW's contact information.       Wellington Hampshire, CSW Intern.

## 2013-04-27 NOTE — Progress Notes (Signed)
[facility] for [facility]ious Disease   Subjective: No new complaints   Antibiotics:  Anti-infectives   Start     Dose/Rate Route Frequency Ordered Stop   04/26/13 0645  ceFAZolin (ANCEF) IVPB 2 g/50 mL premix  Status:  Discontinued     2 g 100 mL/hr over 30 Minutes Intravenous On call to O.R. 04/26/13 0637 04/26/13 1844   04/22/13 0600  [MAR Hold]  cefUROXime (ZINACEF) 1.5 g in dextrose 5 % 50 mL IVPB     (On MAR Hold since 04/22/13 0904)   1.5 g 100 mL/hr over 30 Minutes Intravenous On call to O.R. 04/21/13 1626 04/22/13 1015   04/19/13 1345  nafcillin 2 g in dextrose 5 % 50 mL IVPB  Status:  Discontinued     2 g 100 mL/hr over 30 Minutes Intravenous 6 times per day 04/19/13 1313 04/20/13 1518   04/19/13 1200  vancomycin (VANCOCIN) IVPB 1000 mg/200 mL premix     1,000 mg 200 mL/hr over 60 Minutes Intravenous Every M-W-F (Hemodialysis) 04/17/13 0245     04/17/13 0200  vancomycin (VANCOCIN) 2,000 mg in sodium chloride 0.9 % 500 mL IVPB     2,000 mg 250 mL/hr over 120 Minutes Intravenous  Once 04/17/13 0154 04/17/13 0627   04/17/13 0200  piperacillin-tazobactam (ZOSYN) IVPB 2.25 g  Status:  Discontinued     2.25 g 100 mL/hr over 30 Minutes Intravenous 3 times per day 04/17/13 0154 04/19/13 1312      Medications: Scheduled Meds: . colchicine  0.3 mg Oral Daily  . collagenase   Topical Daily  . feeding supplement (NEPRO CARB STEADY)  237 mL Oral BID BM  . heparin  5,000 Units Subcutaneous 3 times per day  . insulin aspart  0-5 Units Subcutaneous QHS  . insulin aspart  0-9 Units Subcutaneous TID WC  . levothyroxine  175 mcg Oral QAC breakfast  . nebivolol  5 mg Oral QHS  . sodium chloride  3 mL Intravenous Q12H  . vancomycin  1,000 mg Intravenous Q M,W,F-HD   Continuous Infusions: . sodium chloride 35 mL/hr (04/26/13 1709)  . sodium chloride     PRN Meds:.sodium chloride, sodium chloride, sodium chloride, sodium chloride, sodium chloride, acetaminophen, acetaminophen,  feeding supplement (NEPRO CARB STEADY), feeding supplement (NEPRO CARB STEADY), heparin, heparin, heparin, HYDROmorphone (DILAUDID) injection, HYDROmorphone (DILAUDID) injection, lidocaine (PF), lidocaine-prilocaine, loperamide, metoCLOPramide (REGLAN) injection, metoCLOPramide, ondansetron (ZOFRAN) IV, ondansetron (ZOFRAN) IV ondansetron, ondansetron, oxyCODONE, pentafluoroprop-tetrafluoroeth, sodium chloride    Objective: Weight change:   Intake/Output Summary (Last 24 hours) at 04/27/13 2035 Last data filed at 04/27/13 1700  Gross per 24 hour  Intake    480 ml  Output      0 ml  Net    480 ml   Blood pressure 126/72, pulse 64, temperature 97.8 F (36.6 C), temperature source Oral, resp. rate 20, height _0  (1.778 m), weight 165 lb (74.844 kg), SpO2 98.00%. Temp:  [97.8 F (36.6 C)-98.5 F (36.9 C)] 97.8 F (36.6 C) (04/14 1800) Pulse Rate:  [64-67] 64 (04/14 1800) Resp:  [20] 20 (04/14 1800) BP: (112-126)/(66-73) 126/72 mmHg (04/14 1800) SpO2:  [96 %-99 %] 98 % (04/14 1800) Weight:  [165 lb (74.844 kg)] 165 lb (74.844 kg) (04/13 2142)  Physical Exam: General: Alert and awake, oriented x3, not in any acute distress.  HEENT: anicteric sclera, pupils reactive to light and accommodation, EOMI, oropharynx clear and without exudate  CVS regular rate, normal r, faint murmur LLSB  Chest:  clear to auscultation bilaterally, no wheezing, rales or rhonchi  Abdomen: Hyperactive bowel sounds soft nontender, nondistended, normal bowel sounds,  Extremities: Skin: see pictures:  Amputation site with darkening skin, sutures in place:  See sequential pictures from my last note  Wound bandaged:      CBC:   Recent Labs  04/26/13 1233 04/27/13 0620  WBC 13.9* 13.7*  HGB 11.1* 11.4*  HCT 34.8* 36.8*  NA 138 136*  K 3.8 4.1  CL 93* 92*  CO2 24 21  BUN 43* 28*  CREATININE 9.05* 6.27*    BMET  Recent Labs  04/26/13 1233 04/27/13 0620  NA 138 136*  K 3.8 4.1  CL 93*  92*  CO2 24 21  GLUCOSE 85 99  BUN 43* 28*  CREATININE 9.05* 6.27*  CALCIUM 9.7 9.3     Liver Panel   Recent Labs  04/26/13 1233 04/27/13 0620  ALBUMIN 2.5* 2.4*       Sedimentation Rate No results found for this basename: ESRSEDRATE,  in the last 72 hours C-Reactive Protein No results found for this basename: CRP,  in the last 72 hours  Micro Results: Recent Results (from the past 240 hour(s))  CULTURE, BLOOD (ROUTINE X 2)     Status: None   Collection Time    04/19/13  1:04 PM      Result Value Ref Range Status   Specimen Description BLOOD RIGHT HAND   Final   Special Requests BOTTLES DRAWN AEROBIC ONLY 6CC   Final   Culture  Setup Time     Final   Value: 04/19/2013 16:06     Performed at [facility]   Culture     Final   Value: NO GROWTH 5 DAYS     Performed at [facility]   Report Status 04/25/2013 FINAL   Final  CULTURE, BLOOD (ROUTINE X 2)     Status: None   Collection Time    04/19/13  1:10 PM      Result Value Ref Range Status   Specimen Description BLOOD RIGHT ANTECUBITAL   Final   Special Requests BOTTLES DRAWN AEROBIC ONLY 5CC   Final   Culture  Setup Time     Final   Value: 04/19/2013 16:06     Performed at [facility]   Culture     Final   Value: NO GROWTH 5 DAYS     Performed at [facility]   Report Status 04/25/2013 FINAL   Final  WOUND CULTURE     Status: None   Collection Time    04/22/13 10:37 AM      Result Value Ref Range Status   Specimen Description WOUND LEFT FOOT   Final   Special Requests LEFT FIRST TOE PT ON VANCO VANCO   Final   Gram Stain     Final   Value: FEW WBC PRESENT, PREDOMINANTLY PMN     FEW SQUAMOUS EPITHELIAL CELLS PRESENT     MODERATE GRAM POSITIVE COCCI IN CLUSTERS     MODERATE GRAM POSITIVE RODS     Performed at [facility]   Culture     Final   Value: ABUNDANT METHICILLIN RESISTANT STAPHYLOCOCCUS AUREUS     Note: RIFAMPIN AND GENTAMICIN SHOULD NOT BE USED AS  SINGLE DRUGS FOR TREATMENT OF STAPH INFECTIONS. This organism DOES NOT demonstrate inducible Clindamycin resistance in vitro. CRITICAL RESULT CALLED TO, READ BACK BY AND VERIFIED WITH: ALBERT R @ 7:40AM  04/25/13 BY DWEEKS     Performed at [facility]   Report Status 04/25/2013 FINAL   Final   Organism ID, Bacteria METHICILLIN RESISTANT STAPHYLOCOCCUS AUREUS   Final  CLOSTRIDIUM DIFFICILE BY PCR     Status: None   Collection Time    04/24/13 12:25 AM      Result Value Ref Range Status   C difficile by pcr NEGATIVE  NEGATIVE Final    Studies/Results: No results found.    Assessment/Plan:  Principal Problem:   MRSA bacteremia Active Problems:   Essential hypertension, benign   Diabetes mellitus type 1, controlled, with complications   Anemia in chronic kidney disease   End stage renal disease   Peripheral vascular disease, unspecified   Diabetes mellitus   Toe gangrene   Wound infection after surgery   Fever, unspecified   Diarrhea    Jack [name] is a 68 y.o. male with  Multiple myeloma, DM, ESRD on HD via AV fistula with gangrrene in right foot sp 1st amputation readmitted for infection at that site on abx and observation but NOW WITH MRSA bacteremia.  #1  [place] Antimicrobial Management Team Staphylococcus aureus bacteremia   Staphylococcus aureus bacteremia (SAB) is associated with a high rate of complications and mortality. Specific aspects of clinical management are critical to optimizing the outcome of patients with SAB. Therefore, the [facility] Antimicrobial Management Team [facility]) has initiated an intervention aimed at improving the management of SAB at [facility]. To do so, Infectious Diseases physicians are providing an evidence-based consult for the management of all patients with SAB.      Yes  No  Comments   Perform follow-up blood cultures (even if the patient is afebrile) to ensure clearance of bacteremia  _0   _1   Performed on 04/19/13  NGTD  Remove vascular catheter and obtain follow-up blood cultures after the removal of the catheter  _2   _3   No line in place today the fistula appears clean   Perform echocardiography to evaluate for endocarditis (transthoracic ECHO is 40-50% sensitive, TEE is > 90% sensitive)  _4   _5   TEE without vegetations  Consult electrophysiologist to evaluate implanted cardiac device (pacemaker, ICD)  _6   _7   NA   Ensure source control  _8   _9   Have all abscesses been drained effectively?  Have deep seeded infections (septic joints or osteomyelitis) had appropriate surgical debridement?   He underwent I and D with cultures sent by [facility] with MRSA  Now sp TMA by Dr Sharol Given who found gangrenous tissue     Investigate for "metastatic" sites of infection  _10   _11   Does the patient have ANY symptom or physical exam finding that would suggest a deeper infection (back or neck pain that may be suggestive of vertebral osteomyelitis or epidural abscess, muscle pain that could be a symptom of pyomyositis)?  Keep in mind that for deep seeded infections MRI imaging with contrast is preferred rather than other often insensitive tests such as plain x-rays, especially early in a patient's presentation.  I dont see any evidence for other sites besides foot and blood     Change antibiotic therapy to  vancomycin  _12   _13   Beta-lactam antibiotics are preferred for MSSA due to higher cure rates.  If on Vancomycin, goal trough should be 15 - 20 mcg/mL   Estimated duration of IV antibiotic therapy: 8 WEEKS  _14   _15   Consult  case management for probably prolonged outpatient IV antibiotic therapy    #2 Amputation site: sp TMA:   He NEEDS 8 WEEKS OF IV VANCOMYCIN WITH HD POSTOPERATIVELY  I WILL FOLLOW HIM CLOSELY IN ID CLINIC IN NEXT 2 WEEKS  I will otherwise sign off for now  Please call with further questions.     LOS: 11 days   Truman Hayward 04/27/2013, 8:35 PM

## 2013-04-27 NOTE — Evaluation (Addendum)
Physical Therapy  Re- Evaluation Patient Details Name: Jack [name] MRN: [id number] DOB: Jul 16, 1945 Today's Date: 04/27/2013   History of Present Illness  Pt underwent Lt transmetatarsal amputation 04/26/13 due to extensive  gangrene of Lt. foot Pt is to be strict NWB of left LE  Clinical Impression  Pt is weak and unable to attempt to stand at this point.  He needs to be strict NWB, so likely will need to start at a sliding board level.  Question if family can provide this level of assist.  Pt may be safer if he goes for short term rehab at SNF prior to d/c to home     Follow Up Recommendations SNF    Equipment Recommendations  None recommended by PT    Recommendations for Other Services OT consult     Precautions / Restrictions Precautions Precautions: Fall Restrictions Weight Bearing Restrictions: Yes LLE Weight Bearing: Non weight bearing Other Position/Activity Restrictions: strict NWB LLE      Mobility  Bed Mobility Overal bed mobility: Needs Assistance Bed Mobility: Sidelying to Sit;Sit to Sidelying;Rolling Rolling: Min assist Sidelying to sit: Min assist Supine to sit: Min assist     General bed mobility comments:  hand over hand guidance to get to rail for pt to assist pulilng up in bed and raising trunk off bed  Transfers Overall transfer level: Needs assistance Equipment used: Rolling walker (2 wheeled)             General transfer comment: pt was unwilling to try to stand with RW today...he feels too weak  Ambulation/Gait                Stairs            Wheelchair Mobility    Modified Rankin (Stroke Patients Only)       Balance Overall balance assessment: Needs assistance Sitting-balance support: Single extremity supported;Bilateral upper extremity supported Sitting balance-Leahy Scale: Fair Sitting balance - Comments: pt keeps head down and trunk flexed     Standing balance-Leahy Scale: Poor Standing balance comment:  not tested                             Pertinent Vitals/Pain Pt reports he has so much pain with his foot, but the pain meds make him sleepy and someone always comes to wake him up    [place] expects to be discharged to:: Private residence Living Arrangements: Children Available Help at Discharge: Family;Available 24 hours/day Type of Home: Apartment Home Access: Level entry     Home Layout: One level Home Equipment: Walker - 2 wheels;Cane - single point;Shower seat;Wheelchair - manual Additional Comments: having difficulty transferring from low toilet seat recently     Prior Function Level of Independence: Independent with assistive device(s)         Comments: pt was ambulating with RW unless pain was too bad; primarily in wheelchair recently due to pain; could transfer to wheelchair independently      Hand Dominance   Dominant Hand: Right    Extremity/Trunk Assessment               Lower Extremity Assessment: RLE deficits/detail RLE Deficits / Details: well healed transmet amputation LLE Deficits / Details: transmet site wrapped in coban; able to move hip and knee  Cervical / Trunk Assessment: Kyphotic  Communication   Communication: No difficulties  Cognition Arousal/Alertness: Awake/alert Behavior During Therapy: Flat affect Overall  Cognitive Status: Difficult to assess       Memory: Decreased short-term memory;Decreased recall of precautions              General Comments General comments (skin integrity, edema, etc.): generalized deconditioning    Exercises        Assessment/Plan    PT Assessment Patient needs continued PT services;All further PT needs can be met in the next venue of care  PT Diagnosis Difficulty walking;Generalized weakness   PT Problem List Decreased strength;Decreased range of motion;Decreased activity tolerance;Decreased balance;Decreased mobility;Decreased cognition;Decreased knowledge  of use of DME;Decreased safety awareness;Decreased skin integrity;Impaired sensation;Decreased knowledge of precautions  PT Treatment Interventions DME instruction;Gait training;Functional mobility training;Therapeutic exercise;Therapeutic activities;Balance training;Patient/family education   PT Goals (Current goals can be found in the Care Plan section) Acute Rehab PT Goals Patient Stated Goal: to get more independent PT Goal Formulation: With patient Time For Goal Achievement: 05/11/13 Potential to Achieve Goals: Good PT goals from 04/23/2013 still appropriate  Frequency Min 3X/week   Barriers to discharge        Co-evaluation               End of Session   Activity Tolerance: Patient limited by fatigue Patient left: in bed;with call bell/phone within reach Nurse Communication: Mobility status         Time: 1540-1600 PT Time Calculation (min): 20 min   Charges:   PT Evaluation $PT Re-evaluation: 1 Procedure PT Treatments $Therapeutic Activity: 8-22 mins   PT G Codes:         Teresa K. [place], [place] 04/27/2013, 4:13 PM

## 2013-04-27 NOTE — Progress Notes (Signed)
Utilization review completed.  

## 2013-04-27 NOTE — Progress Notes (Signed)
[facility] PROGRESS NOTE  KAYVION ARNESON KVQ:[id number] DOB: 1945/10/26 DOA: 04/16/2013 PCP: [name], MD   Interim History:  Mr. Stemmer. He has a very complex medical history with diabetic foot infection. He was found to have gangrene of his left great toe. 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 He underwent amputation of his left great toe by Dr. Kellie Simmering on 04/07/2013 with subsequent I and D on 04/22/13 per Dr Kellie Simmering.       Assessment/Plan: #1 MRSA bacteremia Likely seeded from wound infection. When cultures growing abundant staph aureus with sensitivities pending. MRI of the foot negative for abscess or osteomyelitis TEE 04/20/2013 was negative for SBE with ejection fraction of 65%. Patient currently afebrile.white count is 14.5. Patient will likely need 8 weeks of IV antibiotics. Infectious disease is following and appreciate input and recommendations.  #2 poorly healing left first toe amputation Patient with a gangrenous foot. Patient is status post I&D of left first toe amputation wound per Dr. Kellie Simmering. Patient was seen by Dr. Sharol Given in consultation and patient underwent a left transmetatarsal amputation on 04/26/2013. Continue IV antibiotics. Per Vascular surgery and orthopedics.  #3 diabetes mellitus type 1 Stable. Continue sliding scale insulin.  #4 hypertension Stable. Continue Cardura,bystolic.  #5 hypothyroidism Continue Synthroid.  #6 end-stage renal disease on hemodialysis Patient has dialysis Monday Wednesdays and Fridays. Nephrology is following. Per renal.  #7 anemia of chronic disease Likely secondary to problem #6. H&H stable follow.  #8 diarrhea C. difficile PCR is negative. May be secondary to Nephro-Vite. Patient refusing Nephro-Vite as he feels this maybe causing his diarrhea.  Imodium as needed.  Code Status: full Family Communication: updated patient. Disposition Plan: home when medically  stable.   Consultants:  Infectious diseases: Dr. Lucianne Lei dam 04/19/2013  Nephrology: Dr. Posey Pronto 04/17/2013  Vascular surgery Dr. Donnetta Hutching 04/17/2013  Orthopedics: Dr. Sharol Given 04/23/2013  Procedures:  I and D left first toe amputation wound 04/22/2013 per Dr. Kellie Simmering  MRI left foot 04/18/2013  MRI left ankle 04/17/2013  TEE 04/20/2013  Left foot transmetatarsal amputation 04/26/2013 per Dr.[name]  Antibiotics:  IV vancomycin 4/4/ 2015  IV Zosyn 04/17/2013 - 04/19/2013  IV nafcillin 04/19/2013 - 04/20/2013  HPI/Subjective: Patient states diarrhea has improved. Patient states left foot is feeling better.   Objective: Filed Vitals:   04/27/13 1400  BP: 112/66  Pulse: 66  Temp: 98.4 F (36.9 C)  Resp: 20    Intake/Output Summary (Last 24 hours) at 04/27/13 1519 Last data filed at 04/27/13 1257  Gross per 24 hour  Intake    735 ml  Output   1507 ml  Net   -772 ml   Filed Weights   04/26/13 1215 04/26/13 1553 04/26/13 2142  Weight: 77.1 kg (169 lb 15.6 oz) 75.5 kg (166 lb 7.2 oz) 74.844 kg (165 lb)    Exam:   General:  NAD  Cardiovascular: RRR  Respiratory: CTAB  Abdomen: SOFT, NONTENDER, NONDISTENDED, POSITIVE BOWEL SOUNDS.  Musculoskeletal: NO CLUBBING CYANOSIS OR EDEMA. lEFT FOOT WRAPPED STATUS POST i&d.  Data Reviewed: Basic Metabolic Panel:  Recent Labs Lab 04/23/13 1156 04/24/13 0700 04/25/13 0400 04/26/13 1233 04/27/13 0620  NA 137 139 137 138 136*  K 3.2* 3.6* 3.4* 3.8 4.1  CL 93* 94* 92* 93* 92*  CO2 25 27 25 24 21   GLUCOSE 174* 146* 92 85 99  BUN 38* 19 28* 43* 28*  CREATININE 7.63* 5.41* 6.91* 9.05* 6.27*  CALCIUM 9.5 9.7 9.9 9.7 9.3  PHOS  4.9* 4.1 5.3* 6.3* 5.6*   Liver Function Tests:  Recent Labs Lab 04/23/13 1156 04/24/13 0700 04/25/13 0400 04/26/13 1233 04/27/13 0620  ALBUMIN 2.2* 2.5* 2.4* 2.5* 2.4*   No results found for this basename: LIPASE, AMYLASE,  in the last 168 hours No results found for this basename:  AMMONIA,  in the last 168 hours CBC:  Recent Labs Lab 04/23/13 1156 04/24/13 0706 04/25/13 0400 04/26/13 1233 04/27/13 0620  WBC 12.9* 13.6* 14.5* 13.9* 13.7*  HGB 11.0* 11.2* 11.1* 11.1* 11.4*  HCT 35.0* 35.8* 35.5* 34.8* 36.8*  MCV 85.2 85.0 86.0 83.9 85.6  PLT 256 266 298 334 260   Cardiac Enzymes: No results found for this basename: CKTOTAL, CKMB, CKMBINDEX, TROPONINI,  in the last 168 hours BNP (last 3 results) No results found for this basename: PROBNP,  in the last 8760 hours CBG:  Recent Labs Lab 04/26/13 1627 04/26/13 1817 04/26/13 2147 04/27/13 0823 04/27/13 1159  GLUCAP 100* 102* 151* 108* 179*    Recent Results (from the past 240 hour(s))  CULTURE, BLOOD (ROUTINE X 2)     Status: None   Collection Time    04/19/13  1:04 PM      Result Value Ref Range Status   Specimen Description BLOOD RIGHT HAND   Final   Special Requests BOTTLES DRAWN AEROBIC ONLY [place]   Final   Culture  Setup Time     Final   Value: 04/19/2013 16:06     Performed at [facility]   Culture     Final   Value: NO GROWTH 5 DAYS     Performed at [facility]   Report Status 04/25/2013 FINAL   Final  CULTURE, BLOOD (ROUTINE X 2)     Status: None   Collection Time    04/19/13  1:10 PM      Result Value Ref Range Status   Specimen Description BLOOD RIGHT ANTECUBITAL   Final   Special Requests BOTTLES DRAWN AEROBIC ONLY 5CC   Final   Culture  Setup Time     Final   Value: 04/19/2013 16:06     Performed at [facility]   Culture     Final   Value: NO GROWTH 5 DAYS     Performed at [facility]   Report Status 04/25/2013 FINAL   Final  WOUND CULTURE     Status: None   Collection Time    04/22/13 10:37 AM      Result Value Ref Range Status   Specimen Description WOUND LEFT FOOT   Final   Special Requests LEFT FIRST TOE PT ON VANCO VANCO   Final   Gram Stain     Final   Value: FEW WBC PRESENT, PREDOMINANTLY PMN     FEW SQUAMOUS EPITHELIAL CELLS  PRESENT     MODERATE GRAM POSITIVE COCCI IN CLUSTERS     MODERATE GRAM POSITIVE RODS     Performed at [facility]   Culture     Final   Value: ABUNDANT METHICILLIN RESISTANT STAPHYLOCOCCUS AUREUS     Note: RIFAMPIN AND GENTAMICIN SHOULD NOT BE USED AS SINGLE DRUGS FOR TREATMENT OF STAPH INFECTIONS. This organism DOES NOT demonstrate inducible Clindamycin resistance in vitro. CRITICAL RESULT CALLED TO, READ BACK BY AND VERIFIED WITH: ALBERT R @ 7:40AM      04/25/13 BY DWEEKS     Performed at [facility]   Report Status 04/25/2013 FINAL   Final  Organism ID, Bacteria METHICILLIN RESISTANT STAPHYLOCOCCUS AUREUS   Final  CLOSTRIDIUM DIFFICILE BY PCR     Status: None   Collection Time    04/24/13 12:25 AM      Result Value Ref Range Status   C difficile by pcr NEGATIVE  NEGATIVE Final     Studies: No results found.  Scheduled Meds: . colchicine  0.3 mg Oral Daily  . collagenase   Topical Daily  . feeding supplement (NEPRO CARB STEADY)  237 mL Oral BID BM  . heparin  5,000 Units Subcutaneous 3 times per day  . insulin aspart  0-5 Units Subcutaneous QHS  . insulin aspart  0-9 Units Subcutaneous TID WC  . levothyroxine  175 mcg Oral QAC breakfast  . nebivolol  5 mg Oral QHS  . sodium chloride  3 mL Intravenous Q12H  . vancomycin  1,000 mg Intravenous Q M,W,F-HD   Continuous Infusions: . sodium chloride 35 mL/hr (04/26/13 1709)  . sodium chloride      Principal Problem:   MRSA bacteremia Active Problems:   Essential hypertension, benign   Diabetes mellitus type 1, controlled, with complications   Anemia in chronic kidney disease   End stage renal disease   Peripheral vascular disease, unspecified   Diabetes mellitus   Toe gangrene   Wound infection after surgery   Fever, unspecified   Diarrhea    Time spent: 35 minutes    Eugenie Filler M.D. [facility] Pager [phone]. If 7PM-7AM, please contact night-coverage at [email or web address], password  [facility] 04/27/2013, 3:19 PM  LOS: 11 days

## 2013-04-27 NOTE — Consult Note (Addendum)
WOC wound follow up Wound type: Ortho team following for assessment and plan of care to foot wound. Please refer to their team for further questions regarding that wound. Refer to previous [place] note 4/10 for description of deep tissue wound to buttocks which has evolved into an unstageable and stage 2 wound at this time.  Pt has gone to the OR since initial assessment and the prolonged pressure in this setting probably contributed to impaired healing of this site. Left buttock .3X.3X.1cm stage 2 wound pink and moist. Right buttock 3X3cm; 50% pink and moist, 40% yellow slough, 10% soft eschar.  Mod amt greenish-yellow drainage, no odor. Pt was previously frequently incontinent of stool and it has been difficult to keep wound from becoming soiled.  Dressing procedure/placement/frequency: Santyl ointment to chemically debride nonviable tissue. Daughter at bedside and discussed topical treatment and pt and daughter verbalizes understanding. Julien Girt MSN, RN, [place], [place], [place]

## 2013-04-27 NOTE — Progress Notes (Signed)
Patient ID: Jack [name], male   DOB: 05-11-1945, 68 y.o.   MRN: [id number] Postoperative day 1 left midfoot amputation. Patient had extreme amount of gangrenous tissue throughout the entire forefoot. Patient will need to be strict nonweightbearing on the left.

## 2013-04-28 LAB — RENAL FUNCTION PANEL
Albumin: 2.4 g/dL — ABNORMAL LOW (ref 3.5–5.2)
BUN: 40 mg/dL — ABNORMAL HIGH (ref 6–23)
CALCIUM: 9.5 mg/dL (ref 8.4–10.5)
CO2: 22 mEq/L (ref 19–32)
Chloride: 91 mEq/L — ABNORMAL LOW (ref 96–112)
Creatinine, Ser: 8.02 mg/dL — ABNORMAL HIGH (ref 0.50–1.35)
GFR calc Af Amer: 7 mL/min — ABNORMAL LOW (ref >90)
GFR calc non Af Amer: 6 mL/min — ABNORMAL LOW (ref >90)
Glucose, Bld: 125 mg/dL — ABNORMAL HIGH (ref 70–99)
Phosphorus: 6.3 mg/dL — ABNORMAL HIGH (ref 2.3–4.6)
Potassium: 4.1 mEq/L (ref 3.7–5.3)
Sodium: 135 mEq/L — ABNORMAL LOW (ref 137–147)

## 2013-04-28 LAB — CBC
HCT: 32.3 % — ABNORMAL LOW (ref 39.0–52.0)
Hemoglobin: 10.2 g/dL — ABNORMAL LOW (ref 13.0–17.0)
MCH: 26.4 pg (ref 26.0–34.0)
MCHC: 31.6 g/dL (ref 30.0–36.0)
MCV: 83.7 fL (ref 78.0–100.0)
Platelets: 261 10*3/uL (ref 150–400)
RBC: 3.86 MIL/uL — ABNORMAL LOW (ref 4.22–5.81)
RDW: 14.8 % (ref 11.5–15.5)
WBC: 12.5 10*3/uL — ABNORMAL HIGH (ref 4.0–10.5)

## 2013-04-28 LAB — GLUCOSE, CAPILLARY
GLUCOSE-CAPILLARY: 124 mg/dL — AB (ref 70–99)
GLUCOSE-CAPILLARY: 126 mg/dL — AB (ref 70–99)
Glucose-Capillary: 142 mg/dL — ABNORMAL HIGH (ref 70–99)

## 2013-04-28 LAB — VANCOMYCIN, RANDOM: VANCOMYCIN RM: 15.4 ug/mL

## 2013-04-28 MED ORDER — ALTEPLASE 2 MG IJ SOLR
2.0000 mg | Freq: Once | INTRAMUSCULAR | Status: DC | PRN
  Filled 2013-04-28: qty 2

## 2013-04-28 MED ORDER — LIDOCAINE HCL (PF) 1 % IJ SOLN: 5.0000 mL | INTRAMUSCULAR | Status: DC | PRN

## 2013-04-28 MED ORDER — PENTAFLUOROPROP-TETRAFLUOROETH EX AERO: 1.0000 "application " | INHALATION_SPRAY | CUTANEOUS | Status: DC | PRN

## 2013-04-28 MED ORDER — NEPRO/CARBSTEADY PO LIQD: 237.0000 mL | ORAL | Status: DC | PRN

## 2013-04-28 MED ORDER — CALCIUM ACETATE 667 MG PO CAPS
667.0000 mg | ORAL_CAPSULE | Freq: Three times a day (TID) | ORAL | Status: DC
  Administered 2013-04-28 – 2013-04-30 (×6): 667 mg via ORAL
  Filled 2013-04-28 (×9): qty 1

## 2013-04-28 MED ORDER — LIDOCAINE-PRILOCAINE 2.5-2.5 % EX CREA: 1.0000 "application " | TOPICAL_CREAM | CUTANEOUS | Status: DC | PRN

## 2013-04-28 MED ORDER — HEPARIN SODIUM (PORCINE) 1000 UNIT/ML DIALYSIS: 1000.0000 [IU] | INTRAMUSCULAR | Status: DC | PRN

## 2013-04-28 MED ORDER — SODIUM CHLORIDE 0.9 % IV SOLN: 100.0000 mL | INTRAVENOUS | Status: DC | PRN

## 2013-04-28 MED ORDER — RENA-VITE PO TABS
1.0000 | ORAL_TABLET | Freq: Every day | ORAL | Status: DC
  Administered 2013-04-28 – 2013-04-29 (×2): 1 via ORAL
  Filled 2013-04-28 (×3): qty 1

## 2013-04-28 NOTE — Clinical Social Work Psychosocial (Signed)
Assessment note reviewed and approved.  Alzada Brazee, MSW, LCSW [phone] 

## 2013-04-28 NOTE — Progress Notes (Addendum)
[facility] PROGRESS NOTE  Jack [name]:[id number] DOB: [birth year] DOA: 04/16/2013 PCP: [name], MD   Interim History:  Jack [name]. He has a very complex medical history with diabetic foot infection. He was found to have gangrene of his left great toe. 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 He underwent amputation of his left great toe by Dr. Kellie Simmering on 04/07/2013 with subsequent I and D on 04/22/13 per Dr Kellie Simmering.       Assessment/Plan: #1 MRSA bacteremia Likely seeded from wound infection. When cultures growing abundant staph aureus with sensitivities pending. MRI of the foot negative for abscess or osteomyelitis TEE 04/20/2013 was negative for SBE with ejection fraction of 65%. Patient currently afebrile.white count is 14.5. Patient will  need 8 weeks of IV antibiotics. Infectious disease s/o on 4/14 and pt is to follow outpt with ID in 2weeks  #2 poorly healing left first toe amputation Patient with a gangrenous foot. Patient is status post I&D of left first toe amputation wound per Dr. Kellie Simmering. Patient was seen by Dr. Sharol Given in consultation and patient underwent a left transmetatarsal amputation on 04/26/2013. Continue IV antibiotics. Per Vascular surgery and orthopedics.  #3 diabetes mellitus type 1 Stable. Continue sliding scale insulin.  #4 hypertension Stable. Continue Cardura,bystolic.  #5 hypothyroidism Continue Synthroid.  #6 end-stage renal disease on hemodialysis Patient has dialysis Monday Wednesdays and Fridays. Nephrology is following. Per renal.  #7 anemia of chronic disease Likely secondary to problem #6. H&H stable follow.  #8 diarrhea C. difficile PCR is negative. May be secondary to Nephro-Vite. Patient refusing Nephro-Vite as he feels this maybe causing his diarrhea.   -conitnue Imodium as needed.  Code Status: full Family Communication: updated patient. Disposition Plan: to SNF when bed  available   Consultants:  Infectious diseases: Dr. Lucianne Lei dam 04/19/2013  Nephrology: Dr. Posey Pronto 04/17/2013  Vascular surgery Dr. Donnetta Hutching 04/17/2013  Orthopedics: Dr. Sharol Given 04/23/2013  Procedures:  I and D left first toe amputation wound 04/22/2013 per Dr. Kellie Simmering  MRI left foot 04/18/2013  MRI left ankle 04/17/2013  TEE 04/20/2013  Left foot transmetatarsal amputation 04/26/2013 per Dr.[name]  Antibiotics:  IV vancomycin 4/4/ 2015  IV Zosyn 04/17/2013 - 04/19/2013  IV nafcillin 04/19/2013 - 04/20/2013  HPI/Subjective: States he still has some diarrhea, denies n/v. Daughter at bedside  Objective: Filed Vitals:   04/28/13 1705  BP: 110/64  Pulse: 71  Temp: 98 F (36.7 C)  Resp: 18    Intake/Output Summary (Last 24 hours) at 04/28/13 1709 Last data filed at 04/28/13 1254  Gross per 24 hour  Intake    120 ml  Output   1996 ml  Net  -1876 ml   Filed Weights   04/27/13 2238 04/28/13 0845 04/28/13 1254  Weight: 75.978 kg (167 lb 8 oz) 75.2 kg (165 lb 12.6 oz) 73.2 kg (161 lb 6 oz)    Exam:   General:  NAD  Cardiovascular: RRR  Respiratory: CTAB  Abdomen: SOFT, NONTENDER, NONDISTENDED, POSITIVE BOWEL SOUNDS.  Musculoskeletal: NO CLUBBING CYANOSIS OR EDEMA. lEFT FOOT WRAPPED STATUS POST i&d.  Data Reviewed: Basic Metabolic Panel:  Recent Labs Lab 04/24/13 0700 04/25/13 0400 04/26/13 1233 04/27/13 0620 04/28/13 0902  NA 139 137 138 136* 135*  K 3.6* 3.4* 3.8 4.1 4.1  CL 94* 92* 93* 92* 91*  CO2 27 25 24 21 22   GLUCOSE 146* 92 85 99 125*  BUN 19 28* 43* 28* 40*  CREATININE 5.41* 6.91* 9.05* 6.27* 8.02*  CALCIUM 9.7 9.9 9.7 9.3 9.5  PHOS 4.1 5.3* 6.3* 5.6* 6.3*   Liver Function Tests:  Recent Labs Lab 04/24/13 0700 04/25/13 0400 04/26/13 1233 04/27/13 0620 04/28/13 0902  ALBUMIN 2.5* 2.4* 2.5* 2.4* 2.4*   No results found for this basename: LIPASE, AMYLASE,  in the last 168 hours No results found for this basename: AMMONIA,  in the  last 168 hours CBC:  Recent Labs Lab 04/24/13 0706 04/25/13 0400 04/26/13 1233 04/27/13 0620 04/28/13 0902  WBC 13.6* 14.5* 13.9* 13.7* 12.5*  HGB 11.2* 11.1* 11.1* 11.4* 10.2*  HCT 35.8* 35.5* 34.8* 36.8* 32.3*  MCV 85.0 86.0 83.9 85.6 83.7  PLT 266 298 334 260 261   Cardiac Enzymes: No results found for this basename: CKTOTAL, CKMB, CKMBINDEX, TROPONINI,  in the last 168 hours BNP (last 3 results) No results found for this basename: PROBNP,  in the last 8760 hours CBG:  Recent Labs Lab 04/27/13 0823 04/27/13 1159 04/27/13 1700 04/27/13 2234 04/28/13 1319  GLUCAP 108* 179* 131* 111* 124*    Recent Results (from the past 240 hour(s))  CULTURE, BLOOD (ROUTINE X 2)     Status: None   Collection Time    04/19/13  1:04 PM      Result Value Ref Range Status   Specimen Description BLOOD RIGHT HAND   Final   Special Requests BOTTLES DRAWN AEROBIC ONLY 6CC   Final   Culture  Setup Time     Final   Value: 04/19/2013 16:06     Performed at [facility]   Culture     Final   Value: NO GROWTH 5 DAYS     Performed at [facility]   Report Status 04/25/2013 FINAL   Final  CULTURE, BLOOD (ROUTINE X 2)     Status: None   Collection Time    04/19/13  1:10 PM      Result Value Ref Range Status   Specimen Description BLOOD RIGHT ANTECUBITAL   Final   Special Requests BOTTLES DRAWN AEROBIC ONLY 5CC   Final   Culture  Setup Time     Final   Value: 04/19/2013 16:06     Performed at [facility]   Culture     Final   Value: NO GROWTH 5 DAYS     Performed at [facility]   Report Status 04/25/2013 FINAL   Final  WOUND CULTURE     Status: None   Collection Time    04/22/13 10:37 AM      Result Value Ref Range Status   Specimen Description WOUND LEFT FOOT   Final   Special Requests LEFT FIRST TOE PT ON VANCO VANCO   Final   Gram Stain     Final   Value: FEW WBC PRESENT, PREDOMINANTLY PMN     FEW SQUAMOUS EPITHELIAL CELLS PRESENT     MODERATE  GRAM POSITIVE COCCI IN CLUSTERS     MODERATE GRAM POSITIVE RODS     Performed at [facility]   Culture     Final   Value: ABUNDANT METHICILLIN RESISTANT STAPHYLOCOCCUS AUREUS     Note: RIFAMPIN AND GENTAMICIN SHOULD NOT BE USED AS SINGLE DRUGS FOR TREATMENT OF STAPH INFECTIONS. This organism DOES NOT demonstrate inducible Clindamycin resistance in vitro. CRITICAL RESULT CALLED TO, READ BACK BY AND VERIFIED WITH: ALBERT R @ 7:40AM      04/25/13 BY DWEEKS     Performed at [facility]  Report Status 04/25/2013 FINAL   Final   Organism ID, Bacteria METHICILLIN RESISTANT STAPHYLOCOCCUS AUREUS   Final  CLOSTRIDIUM DIFFICILE BY PCR     Status: None   Collection Time    04/24/13 12:25 AM      Result Value Ref Range Status   C difficile by pcr NEGATIVE  NEGATIVE Final     Studies: No results found.  Scheduled Meds: . calcium acetate  667 mg Oral TID WC  . colchicine  0.3 mg Oral Daily  . collagenase   Topical Daily  . feeding supplement (NEPRO CARB STEADY)  237 mL Oral BID BM  . heparin  5,000 Units Subcutaneous 3 times per day  . insulin aspart  0-5 Units Subcutaneous QHS  . insulin aspart  0-9 Units Subcutaneous TID WC  . levothyroxine  175 mcg Oral QAC breakfast  . multivitamin  1 tablet Oral QHS  . nebivolol  5 mg Oral QHS  . sodium chloride  3 mL Intravenous Q12H  . vancomycin  1,000 mg Intravenous Q M,W,F-HD   Continuous Infusions: . sodium chloride 35 mL/hr (04/26/13 1709)  . sodium chloride      Principal Problem:   MRSA bacteremia Active Problems:   Essential hypertension, benign   Diabetes mellitus type 1, controlled, with complications   Anemia in chronic kidney disease   End stage renal disease   Peripheral vascular disease, unspecified   Diabetes mellitus   Toe gangrene   Wound infection after surgery   Fever, unspecified   Diarrhea    Time spent: 35 minutes    Sheila Oats M.D. [facility] Pager [phone]. If 7PM-7AM,  please contact night-coverage at [email or web address], password [facility] 04/28/2013, 5:09 PM  LOS: 12 days

## 2013-04-28 NOTE — Progress Notes (Signed)
Subjective:  No complaints, pain currently controlled, but concerned about left foot pain during dialysis today  Objective: Vital signs in last 24 hours: Temp:  [97.8 F (36.6 C)-98.8 F (37.1 C)] 98.8 F (37.1 C) (04/15 0559) Pulse Rate:  [63-67] 63 (04/15 0559) Resp:  [20] 20 (04/15 0559) BP: (112-126)/(65-74) 119/65 mmHg (04/15 0559) SpO2:  [96 %-100 %] 98 % (04/15 0559) Weight:  [75.978 kg (167 lb 8 oz)] 75.978 kg (167 lb 8 oz) (04/14 2238) Weight change: -1.122 kg (-2 lb 7.6 oz)  Intake/Output from previous day: 04/14 0701 - 04/15 0700 In: 600 [P.O.:600] Out: -    Lab Results:  Recent Labs  04/26/13 1233 04/27/13 0620  WBC 13.9* 13.7*  HGB 11.1* 11.4*  HCT 34.8* 36.8*  PLT 334 260   BMET:  Recent Labs  04/26/13 1233 04/27/13 0620  NA 138 136*  K 3.8 4.1  CL 93* 92*  CO2 24 21  GLUCOSE 85 99  BUN 43* 28*  CREATININE 9.05* 6.27*  CALCIUM 9.7 9.3  ALBUMIN 2.5* 2.4*   No results found for this basename: PTH,  in the last 72 hours Iron Studies: No results found for this basename: IRON, TIBC, TRANSFERRIN, FERRITIN,  in the last 72 hours  Studies/Results: No results found.  EXAM:  General appearance: Alert, in no apparent distress  Resp: CTA without rales, rhonchi, or wheezes  Cardio: RRR with Gr II/VI systolic murmur, no rub  GI: + BS, soft and nontender  Extremities: No edema, R TMA well healed; L transmet amp with heavy dressing  Access: AVF @ LUA with + bruit   Dialysis: [facility]  4h 400/800 85.5kg Heparin 8700 LUA AVF  Hectorol 1 EPO none   Assessment/Plan: 1. Wound infection left foot / MRSA bacteremia - s/p L great toe amputation 3/25, irrigation & debridement 4/9 per Dr. Kellie Simmering, wound culture + for MRSA, on Vancomycin; TEE 4/7 showed moderate LVH, EF 65%, no vegetation; L transmet amputation per Dr. Sharol Given 4/15.  2. ESRD - HD on MWF @ [place], K 4.1. Next HD today.  3. HTN/Volume - BP 119/65 on Doxazosin 2 mg qhs, Bystolic 5 mg qhs; wt 76 kg,  below EDW.  4. Anemia - Hgb 11.4, stable.  5. Sec HPT - Ca 9.7 (10.9 corrected), P 6.3; 2Ca bath, Hectorol 1 mcg on hold sec to high Ca, no binders.  6. Nutrition - Alb 2.5, carb-mod renal diet, multivitamin, supplement.  7. DM - insulin per primary.     LOS: 12 days   Jack [name] 04/28/2013,7:01 AM  I have seen and examined this patient and agree with plan per Jack [name].  Pt seen on HD. AP 150 Vp 200  BFR 400.  . Will resume PO4 binder and start renavite.  He plans to get up to a chair today.  ................................................................................................................................................Marland Kitchen Bennie Dallas 04/28/2013 8:48 AM

## 2013-04-28 NOTE — Progress Notes (Signed)
ANTIBIOTIC CONSULT NOTE - FOLLOW UP  Pharmacy Consult for Vancomycin Indication: MRSA bacteremia  Allergies  Allergen Reactions  . Lisinopril Cough  . Ivp Dye [Iodinated Diagnostic Agents] Hives  . Morphine And Related Other (See Comments)    Bradycardia states patient    Patient Measurements: Height: 5\' 10"  (177.8 cm) Weight: 161 lb 6 oz (73.2 kg) IBW/kg (Calculated) : 73  Vital Signs: Temp: 98.4 F (36.9 C) (04/15 1321) Temp src: Oral (04/15 1321) BP: 110/72 mmHg (04/15 1321) Pulse Rate: 70 (04/15 1321) Intake/Output from previous day: 04/14 0701 - 04/15 0700 In: 600 [P.O.:600] Out: -  Intake/Output from this shift: Total I/O In: -  Out: 1996 [Other:1996]  Labs:  Recent Labs  04/26/13 1233 04/27/13 0620 04/28/13 0902  WBC 13.9* 13.7* 12.5*  HGB 11.1* 11.4* 10.2*  PLT 334 260 261  CREATININE 9.05* 6.27* 8.02*   Estimated Creatinine Clearance: 9 ml/min (by C-G formula based on Cr of 8.02).  Recent Labs  04/28/13 0905  [facility] 15.4     Microbiology: Recent Results (from the past 720 hour(s))  SURGICAL PCR SCREEN     Status: None   Collection Time    04/17/13  5:10 AM      Result Value Ref Range Status   MRSA, PCR NEGATIVE  NEGATIVE Final   Staphylococcus aureus NEGATIVE  NEGATIVE Final   Comment:            The Xpert SA Assay (FDA     approved for NASAL specimens     in patients over 37 years of age),     is one component of     a comprehensive surveillance     program.  Test performance has     been validated by [employer] for patients greater     than or equal to 39 year old.     It is not intended     to diagnose infection nor to     guide or monitor treatment.  CULTURE, BLOOD (ROUTINE X 2)     Status: None   Collection Time    04/17/13  5:48 AM      Result Value Ref Range Status   Specimen Description BLOOD RIGHT HAND   Final   Special Requests BOTTLES DRAWN AEROBIC ONLY 5CC   Final   Culture  Setup Time     Final   Value:  04/17/2013 12:44     Performed at [facility]   Culture     Final   Value: METHICILLIN RESISTANT STAPHYLOCOCCUS AUREUS     Note: RIFAMPIN AND GENTAMICIN SHOULD NOT BE USED AS SINGLE DRUGS FOR TREATMENT OF STAPH INFECTIONS. This organism DOES NOT demonstrate inducible Clindamycin resistance in vitro. CRITICAL RESULT CALLED TO, READ BACK BY AND VERIFIED WITH: JAMIE COVINGTON      04/19/13 1437 BY SMITHERSJ     Note: Gram Stain Report Called to,Read Back By and Verified With: Virgilio Frees 04/18/12 @ 10:45AM BY RUSCOE A.     Performed at [facility]   Report Status 04/20/2013 FINAL   Final   Organism ID, Bacteria METHICILLIN RESISTANT STAPHYLOCOCCUS AUREUS   Final  CULTURE, BLOOD (ROUTINE X 2)     Status: None   Collection Time    04/17/13  5:50 AM      Result Value Ref Range Status   Specimen Description BLOOD RIGHT HAND   Final   Special Requests     Final  Value: BOTTLES DRAWN AEROBIC AND ANAEROBIC 10CC BLUE,5CC RED   Culture  Setup Time     Final   Value: 04/17/2013 12:44     Performed at [facility]   Culture     Final   Value: STAPHYLOCOCCUS AUREUS     Note: SUSCEPTIBILITIES PERFORMED ON PREVIOUS CULTURE WITHIN THE LAST 5 DAYS.     Note: Gram Stain Report Called to,Read Back By and Verified With: Earleen Reaper RN on 04/18/13 at 05:50 by Rise Mu     Performed at [facility]   Report Status 04/20/2013 FINAL   Final  CULTURE, BLOOD (ROUTINE X 2)     Status: None   Collection Time    04/19/13  1:04 PM      Result Value Ref Range Status   Specimen Description BLOOD RIGHT HAND   Final   Special Requests BOTTLES DRAWN AEROBIC ONLY [facility]   Final   Culture  Setup Time     Final   Value: 04/19/2013 16:06     Performed at [facility]   Culture     Final   Value: NO GROWTH 5 DAYS     Performed at [facility]   Report Status 04/25/2013 FINAL   Final  CULTURE, BLOOD (ROUTINE X 2)     Status: None   Collection Time    04/19/13  1:10 PM       Result Value Ref Range Status   Specimen Description BLOOD RIGHT ANTECUBITAL   Final   Special Requests BOTTLES DRAWN AEROBIC ONLY 5CC   Final   Culture  Setup Time     Final   Value: 04/19/2013 16:06     Performed at [facility]   Culture     Final   Value: NO GROWTH 5 DAYS     Performed at [facility]   Report Status 04/25/2013 FINAL   Final  WOUND CULTURE     Status: None   Collection Time    04/22/13 10:37 AM      Result Value Ref Range Status   Specimen Description WOUND LEFT FOOT   Final   Special Requests LEFT FIRST TOE PT ON VANCO VANCO   Final   Gram Stain     Final   Value: FEW WBC PRESENT, PREDOMINANTLY PMN     FEW SQUAMOUS EPITHELIAL CELLS PRESENT     MODERATE GRAM POSITIVE COCCI IN CLUSTERS     MODERATE GRAM POSITIVE RODS     Performed at [facility]   Culture     Final   Value: ABUNDANT METHICILLIN RESISTANT STAPHYLOCOCCUS AUREUS     Note: RIFAMPIN AND GENTAMICIN SHOULD NOT BE USED AS SINGLE DRUGS FOR TREATMENT OF STAPH INFECTIONS. This organism DOES NOT demonstrate inducible Clindamycin resistance in vitro. CRITICAL RESULT CALLED TO, READ BACK BY AND VERIFIED WITH: ALBERT R @ 7:40AM      04/25/13 BY DWEEKS     Performed at [facility]   Report Status 04/25/2013 FINAL   Final   Organism ID, Bacteria METHICILLIN RESISTANT STAPHYLOCOCCUS AUREUS   Final  CLOSTRIDIUM DIFFICILE BY PCR     Status: None   Collection Time    04/24/13 12:25 AM      Result Value Ref Range Status   C difficile by pcr NEGATIVE  NEGATIVE Final    Anti-infectives   Start     Dose/Rate Route Frequency Ordered Stop   04/26/13 0645  ceFAZolin (ANCEF)  IVPB 2 g/50 mL premix  Status:  Discontinued     2 g 100 mL/hr over 30 Minutes Intravenous On call to O.R. 04/26/13 0637 04/26/13 1844   04/22/13 0600  [MAR Hold]  cefUROXime (ZINACEF) 1.5 g in dextrose 5 % 50 mL IVPB     (On MAR Hold since 04/22/13 0904)   1.5 g 100 mL/hr over 30 Minutes Intravenous On  call to O.R. 04/21/13 1626 04/22/13 1015   04/19/13 1345  nafcillin 2 g in dextrose 5 % 50 mL IVPB  Status:  Discontinued     2 g 100 mL/hr over 30 Minutes Intravenous 6 times per day 04/19/13 1313 04/20/13 1518   04/19/13 1200  vancomycin (VANCOCIN) IVPB 1000 mg/200 mL premix     1,000 mg 200 mL/hr over 60 Minutes Intravenous Every M-W-F (Hemodialysis) 04/17/13 0245     04/17/13 0200  vancomycin (VANCOCIN) 2,000 mg in sodium chloride 0.9 % 500 mL IVPB     2,000 mg 250 mL/hr over 120 Minutes Intravenous  Once 04/17/13 0154 04/17/13 0627   04/17/13 0200  piperacillin-tazobactam (ZOSYN) IVPB 2.25 g  Status:  Discontinued     2.25 g 100 mL/hr over 30 Minutes Intravenous 3 times per day 04/17/13 0154 04/19/13 1312      Assessment: 68 y.o. M with ESRD who continues on Vancomycin for MRSA bacteremia thought to be due to a L-foot infection, s/p I&D on 4/9. TEE negative for IE. ID on board and want to treat with Vancomycin for 8 weeks. The patient is s/p a transmet amputation on 4/13. A pre-HD Vancomycin level drawn today is therapeutic (Vanc level 15.1 mcg/ml, goal of 15-25 mcg/ml). Will continue with current dosing regimen.   Goal of Therapy:  Pre-HD Vancomycin level of 15-25 mcg/ml  Plan:  1. Continue Vancomcyin 1g IV post HD sessions on MWF 2. Will continue to follow HD schedule/duration for appropriateness of Vancomycin doses  Alycia Rossetti, PharmD, BCPS Clinical Pharmacist Pager: [phone] 04/28/2013 1:46 PM

## 2013-04-29 LAB — CBC
HCT: 34.5 % — ABNORMAL LOW (ref 39.0–52.0)
Hemoglobin: 10.8 g/dL — ABNORMAL LOW (ref 13.0–17.0)
MCH: 26.7 pg (ref 26.0–34.0)
MCHC: 31.3 g/dL (ref 30.0–36.0)
MCV: 85.2 fL (ref 78.0–100.0)
PLATELETS: 220 10*3/uL (ref 150–400)
RBC: 4.05 MIL/uL — AB (ref 4.22–5.81)
RDW: 14.7 % (ref 11.5–15.5)
WBC: 15.1 10*3/uL — ABNORMAL HIGH (ref 4.0–10.5)

## 2013-04-29 LAB — RENAL FUNCTION PANEL
Albumin: 2.6 g/dL — ABNORMAL LOW (ref 3.5–5.2)
BUN: 21 mg/dL (ref 6–23)
CHLORIDE: 95 meq/L — AB (ref 96–112)
CO2: 26 meq/L (ref 19–32)
Calcium: 9.7 mg/dL (ref 8.4–10.5)
Creatinine, Ser: 5.69 mg/dL — ABNORMAL HIGH (ref 0.50–1.35)
GFR, EST AFRICAN AMERICAN: 11 mL/min — AB (ref >90)
GFR, EST NON AFRICAN AMERICAN: 9 mL/min — AB (ref >90)
Glucose, Bld: 132 mg/dL — ABNORMAL HIGH (ref 70–99)
POTASSIUM: 4.2 meq/L (ref 3.7–5.3)
Phosphorus: 5.1 mg/dL — ABNORMAL HIGH (ref 2.3–4.6)
SODIUM: 139 meq/L (ref 137–147)

## 2013-04-29 LAB — GLUCOSE, CAPILLARY
GLUCOSE-CAPILLARY: 134 mg/dL — AB (ref 70–99)
GLUCOSE-CAPILLARY: 113 mg/dL — AB (ref 70–99)
Glucose-Capillary: 240 mg/dL — ABNORMAL HIGH (ref 70–99)
Glucose-Capillary: 150 mg/dL — ABNORMAL HIGH (ref 70–99)

## 2013-04-29 NOTE — Progress Notes (Signed)
Subjective:  No complaints, pain controlled, tolerated yesterday's dialysis much better  Objective: Vital signs in last 24 hours: Temp:  [98 F (36.7 C)-98.7 F (37.1 C)] 98.7 F (37.1 C) (04/16 0500) Pulse Rate:  [59-71] 67 (04/16 0500) Resp:  [18-20] 18 (04/16 0500) BP: (98-132)/(50-72) 116/69 mmHg (04/16 0500) SpO2:  [97 %-99 %] 97 % (04/16 0500) Weight:  [73.2 kg (161 lb 6 oz)-75.2 kg (165 lb 12.6 oz)] 74.254 kg (163 lb 11.2 oz) (04/15 2100) Weight change: -0.778 kg (-1 lb 11.4 oz)  Intake/Output from previous day: 04/15 0701 - 04/16 0700 In: 480 [P.O.:480] Out: 1996    Lab Results:  Recent Labs  04/28/13 0902 04/29/13 0510  WBC 12.5* 15.1*  HGB 10.2* 10.8*  HCT 32.3* 34.5*  PLT 261 220   BMET:  Recent Labs  04/28/13 0902 04/29/13 0510  NA 135* 139  K 4.1 4.2  CL 91* 95*  CO2 22 26  GLUCOSE 125* 132*  BUN 40* 21  CREATININE 8.02* 5.69*  CALCIUM 9.5 9.7  ALBUMIN 2.4* 2.6*   No results found for this basename: PTH,  in the last 72 hours Iron Studies: No results found for this basename: IRON, TIBC, TRANSFERRIN, FERRITIN,  in the last 72 hours  Studies/Results: No results found.  EXAM:  General appearance: Alert, in no apparent distress  Resp: CTA without rales, rhonchi, or wheezes  Cardio: RRR with Gr II/VI systolic murmur, no rub  GI: + BS, soft and nontender  Extremities: No edema, R TMA well healed; L transmet amp with heavy dressing  Access: AVF @ LUA with + bruit   D[facility]  4h 400/800 85.5kg Heparin 8700 LUA AVF  Hectorol 1 EPO none   Assessment/Plan: 1. Wound infection left foot / MRSA bacteremia - s/p L great toe amputation 3/25, irrigation & debridement 4/9 per Dr. Kellie Simmering, wound culture + for MRSA, on Vancomycin; TEE 4/7 showed moderate LVH, EF 65%, no vegetation; L transmet amputation per Dr. Sharol Given 4/15.  2. ESRD - HD on MWF @ [place], K 4.2. Next HD tomorrow.  3. HTN/Volume - BP 619/50 on Bystolic 5 mg qhs, Doxazosin dc'd; wt 74.3  kg s/p net UF 2 L yesterday, below EDW.  4. Anemia - Hgb 10.8, stable.  5. Sec HPT - Ca 9.7 (10.8 corrected), P 5.1; 2Ca bath, Hectorol 1 mcg on hold sec to high Ca, no binders.  6. Nutrition - Alb 2.6, carb-mod renal diet, multivitamin, supplement.  7. DM - insulin per primary.     LOS: 13 days   Ramiro Harvest 04/29/2013,7:14 AM I have seen and examined this patient and agree with plan per Ramiro Harvest. Working with PT.  He thinks he can go home as his brother plans to help him.  Told him to have brother come to hospital and work with PT to see if he can truly handle him.  HD tomorrow.Bennie Dallas 04/29/2013 10:29 AM

## 2013-04-29 NOTE — Clinical Social Work Note (Signed)
CSW talked with patient today to provide him with bed offers. Patient explained to CSW that he lives with his brother and 2 daughters, has a one-level home and has a cane and walkers at home to help him get around. His plan is discharge home as he does not want to go to a facility for rehab. CSW expressed understanding. Information relayed to nurse case manager so that [facility] services can be put in place. CSW signing off as patient declining SNF placement.  Jack [name], MSW, LCSW [phone]

## 2013-04-29 NOTE — Progress Notes (Addendum)
[facility] PROGRESS NOTE  Jack [name] WEX:[id number] DOB: 1945/09/26 DOA: 04/16/2013 PCP: [name], MD   Interim History:  Jack [name]. He has a very complex medical history with diabetic foot infection. He was found to have gangrene of his left great toe. 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 He underwent amputation of his left great toe by Dr. Kellie Simmering on 04/07/2013 with subsequent I and D on 04/22/13 per Dr Kellie Simmering.       Assessment/Plan: #1 MRSA bacteremia Likely seeded from wound infection. When cultures growing abundant staph aureus with sensitivities pending. MRI of the foot negative for abscess or osteomyelitis TEE 04/20/2013 was negative for SBE with ejection fraction of 65%. Patient currently afebrile.white count is 14.5. Patient will likely need 8 weeks of IV antibiotics. Infectious disease is following and appreciate input and recommendations.  #2 poorly healing left first toe amputation Patient with a gangrenous foot. Patient is status post I&D of left first toe amputation wound per Dr. Kellie Simmering. Patient was seen by Dr. Sharol Given in consultation and patient underwent a left transmetatarsal amputation on 04/26/2013. Continue IV antibiotics. Per Vascular surgery and orthopedics. -awaiting SNF  #3 diabetes mellitus type 1 Stable. Continue sliding scale insulin.  #4 hypertension Stable. Continue Cardura,bystolic.  #5 hypothyroidism Continue Synthroid.  #6 end-stage renal disease on hemodialysis Patient has dialysis Monday Wednesdays and Fridays. Nephrology is following. Per renal.  #7 anemia of chronic disease Likely secondary to problem #6. H&H stable follow.  #8 diarrhea C. difficile PCR is negative. May be secondary to Nephro-Vite. Patient refusing Nephro-Vite as he feels this maybe causing his diarrhea.  Imodium as needed.  Code Status: full Family Communication: updated patient. Disposition Plan: home when  medically stable.   Consultants:  Infectious diseases: Dr. Lucianne Lei dam 04/19/2013  Nephrology: Dr. Posey Pronto 04/17/2013  Vascular surgery Dr. Donnetta Hutching 04/17/2013  Orthopedics: Dr. Sharol Given 04/23/2013  Procedures:  I and D left first toe amputation wound 04/22/2013 per Dr. Kellie Simmering  MRI left foot 04/18/2013  MRI left ankle 04/17/2013  TEE 04/20/2013  Left foot transmetatarsal amputation 04/26/2013 per Dr.[name]  Antibiotics:  IV vancomycin 4/4/ 2015  IV Zosyn 04/17/2013 - 04/19/2013  IV nafcillin 04/19/2013 - 04/20/2013  HPI/Subjective: Status post dialysis, denies any complaints at this time   Objective: Filed Vitals:   04/29/13 1745  BP: 126/69  Pulse: 64  Temp: 98.1 F (36.7 C)  Resp: 19    Intake/Output Summary (Last 24 hours) at 04/29/13 1845 Last data filed at 04/29/13 1300  Gross per 24 hour  Intake    360 ml  Output      0 ml  Net    360 ml   Filed Weights   04/28/13 0845 04/28/13 1254 04/28/13 2100  Weight: 75.2 kg (165 lb 12.6 oz) 73.2 kg (161 lb 6 oz) 74.254 kg (163 lb 11.2 oz)    Exam:   General:  NAD  Cardiovascular: RRR  Respiratory: CTAB  Abdomen: SOFT, NONTENDER, NONDISTENDED, POSITIVE BOWEL SOUNDS.  Musculoskeletal: NO CLUBBING CYANOSIS OR EDEMA. lEFT FOOT WRAPPED STATUS POST i&d.  Data Reviewed: Basic Metabolic Panel:  Recent Labs Lab 04/25/13 0400 04/26/13 1233 04/27/13 0620 04/28/13 0902 04/29/13 0510  NA 137 138 136* 135* 139  K 3.4* 3.8 4.1 4.1 4.2  CL 92* 93* 92* 91* 95*  CO2 25 24 21 22 26   GLUCOSE 92 85 99 125* 132*  BUN 28* 43* 28* 40* 21  CREATININE 6.91* 9.05* 6.27* 8.02* 5.69*  CALCIUM 9.9 9.7  9.3 9.5 9.7  PHOS 5.3* 6.3* 5.6* 6.3* 5.1*   Liver Function Tests:  Recent Labs Lab 04/25/13 0400 04/26/13 1233 04/27/13 0620 04/28/13 0902 04/29/13 0510  ALBUMIN 2.4* 2.5* 2.4* 2.4* 2.6*   No results found for this basename: LIPASE, AMYLASE,  in the last 168 hours No results found for this basename: AMMONIA,  in the  last 168 hours CBC:  Recent Labs Lab 04/25/13 0400 04/26/13 1233 04/27/13 0620 04/28/13 0902 04/29/13 0510  WBC 14.5* 13.9* 13.7* 12.5* 15.1*  HGB 11.1* 11.1* 11.4* 10.2* 10.8*  HCT 35.5* 34.8* 36.8* 32.3* 34.5*  MCV 86.0 83.9 85.6 83.7 85.2  PLT 298 334 260 261 220   Cardiac Enzymes: No results found for this basename: CKTOTAL, CKMB, CKMBINDEX, TROPONINI,  in the last 168 hours BNP (last 3 results) No results found for this basename: PROBNP,  in the last 8760 hours CBG:  Recent Labs Lab 04/28/13 1319 04/28/13 1628 04/28/13 2112 04/29/13 0810 04/29/13 1136  GLUCAP 124* 142* 126* 134* 240*    Recent Results (from the past 240 hour(s))  WOUND CULTURE     Status: None   Collection Time    04/22/13 10:37 AM      Result Value Ref Range Status   Specimen Description WOUND LEFT FOOT   Final   Special Requests LEFT FIRST TOE PT ON VANCO VANCO   Final   Gram Stain     Final   Value: FEW WBC PRESENT, PREDOMINANTLY PMN     FEW SQUAMOUS EPITHELIAL CELLS PRESENT     MODERATE GRAM POSITIVE COCCI IN CLUSTERS     MODERATE GRAM POSITIVE RODS     Performed at [facility]   Culture     Final   Value: ABUNDANT METHICILLIN RESISTANT STAPHYLOCOCCUS AUREUS     Note: RIFAMPIN AND GENTAMICIN SHOULD NOT BE USED AS SINGLE DRUGS FOR TREATMENT OF STAPH INFECTIONS. This organism DOES NOT demonstrate inducible Clindamycin resistance in vitro. CRITICAL RESULT CALLED TO, READ BACK BY AND VERIFIED WITH: ALBERT R @ 7:40AM      04/25/13 BY DWEEKS     Performed at [facility]   Report Status 04/25/2013 FINAL   Final   Organism ID, Bacteria METHICILLIN RESISTANT STAPHYLOCOCCUS AUREUS   Final  CLOSTRIDIUM DIFFICILE BY PCR     Status: None   Collection Time    04/24/13 12:25 AM      Result Value Ref Range Status   C difficile by pcr NEGATIVE  NEGATIVE Final     Studies: No results found.  Scheduled Meds: . calcium acetate  667 mg Oral TID WC  . colchicine  0.3 mg Oral Daily   . collagenase   Topical Daily  . feeding supplement (NEPRO CARB STEADY)  237 mL Oral BID BM  . heparin  5,000 Units Subcutaneous 3 times per day  . insulin aspart  0-5 Units Subcutaneous QHS  . insulin aspart  0-9 Units Subcutaneous TID WC  . levothyroxine  175 mcg Oral QAC breakfast  . multivitamin  1 tablet Oral QHS  . nebivolol  5 mg Oral QHS  . sodium chloride  3 mL Intravenous Q12H  . vancomycin  1,000 mg Intravenous Q M,W,F-HD   Continuous Infusions: . sodium chloride 35 mL/hr (04/26/13 1709)  . sodium chloride      Principal Problem:   MRSA bacteremia Active Problems:   Essential hypertension, benign   Diabetes mellitus type 1, controlled, with complications   Anemia in chronic kidney  disease   End stage renal disease   Peripheral vascular disease, unspecified   Diabetes mellitus   Toe gangrene   Wound infection after surgery   Fever, unspecified   Diarrhea    Time spent: 35 minutes    Sheila Oats M.D. [facility] Pager [phone]. If 7PM-7AM, please contact night-coverage at [email or web address], password [facility] 04/29/2013, 6:45 PM  LOS: 13 days

## 2013-04-29 NOTE — Progress Notes (Signed)
Physical Therapy Treatment Patient Details Name: Jack [name] MRN: [id number] DOB: Mar 12, 1945 Today's Date: 04/29/2013    History of Present Illness Pt underwent Lt transmetatarsal amputation 04/26/13 due to extensive  gangrene of Lt. foot Pt is to be strict NWB of left LE    PT Comments    Pt able to transfer bed to chair with max assist of squat pivot and do not feel family will be able to provide accurate level of assist and maintain LLE NWB for DC home. Dgtr and Dr.Mattingly present at beginning of session with pt stating family could assist with transfers and provide 24hr care at home however pt with very poor mobility and educated for need for ST-SNF until able to perform at a min assist level for return home. Will continue to follow and RN educated for transfer. Pt benefits from max encouragement.   Follow Up Recommendations        Equipment Recommendations       Recommendations for Other Services       Precautions / Restrictions Precautions Precautions: Fall Restrictions LLE Weight Bearing: Non weight bearing Other Position/Activity Restrictions: strict NWB LLE    Mobility  Bed Mobility Overal bed mobility: Needs Assistance Bed Mobility: Rolling;Sidelying to Sit Rolling: Min guard Sidelying to sit: Min assist       General bed mobility comments: cues for use of rail to roll and assist to elevate trunk from surface  Transfers Overall transfer level: Needs assistance   Transfers: Squat Pivot Transfers Sit to Stand: Max assist   Squat pivot transfers: Max assist     General transfer comment: pt attempted to stand on RLE x 2 with RW and bed elevated with PT foot under pt LLE to monitor NWB but pt unable to stand without pushing through LLE. Transferred to squat pivot again with max cues and pillow under LLE to limit pushing with assist of pad and bobath technique to transfer bed to chair toward pt Right with drop arm down.  Ambulation/Gait                  Stairs            Wheelchair Mobility    Modified Rankin (Stroke Patients Only)       Balance     Sitting balance-Leahy Scale: Fair                              Cognition Arousal/Alertness: Awake/alert Behavior During Therapy: Flat affect Overall Cognitive Status: Impaired/Different from baseline Area of Impairment: Memory;Safety/judgement     Memory: Decreased short-term memory;Decreased recall of precautions   Safety/Judgement: Decreased awareness of deficits;Decreased awareness of safety          Exercises      General Comments        Pertinent Vitals/Pain 7/10 LLE , repositioned RN notified    Home Living                      Prior Function            PT Goals (current goals can now be found in the care plan section) Progress towards PT goals: Goals downgraded-see care plan    Frequency       PT Plan Current plan remains appropriate    Co-evaluation             End of Session   Activity Tolerance: Patient limited  by fatigue;Patient limited by pain Patient left: in chair;with call bell/phone within reach     Time: 1007-1032 PT Time Calculation (min): 25 min  Charges:  $Therapeutic Activity: 23-37 mins                    G Codes:      Chazlyn Cude B Chiara Coltrin 2013/05/01, 10:37 AM Elwyn Reach, [place]

## 2013-04-29 NOTE — Clinical Social Work Placement (Signed)
Clinical Social Work Department CLINICAL SOCIAL WORK PLACEMENT NOTE 04/29/2013  Patient:  ARLEIGH, ODOWD  Account Number:  [id number] Admit date:  04/16/2013  Clinical Social Worker:  Prisilla Kocsis Givens, LCSW  Date/time:  04/29/2013 07:14 AM  Clinical Social Work is seeking post-discharge placement for this patient at the following level of care:   [place]   (*CSW will update this form in Epic as items are completed)   04/27/2013  Patient/family provided with [facility] Department of Clinical Social Work's list of facilities offering this level of care within the geographic area requested by the patient (or if unable, by the patient's family).  04/27/2013  Patient/family informed of their freedom to choose among providers that offer the needed level of care, that participate in Medicare, Medicaid or managed care program needed by the patient, have an available bed and are willing to accept the patient.    Patient/family informed of [facility]' ownership interest in [facility], as well as of the fact that they are under no obligation to receive care at this facility.  PASARR submitted to EDS on  PASARR number received from [place] on   FL2 transmitted to all facilities in geographic area requested by pt/family on  04/28/2013 FL2 transmitted to all facilities within larger geographic area on   Patient informed that his/her managed care company has contracts with or will negotiate with  certain facilities, including the following:     Patient/family informed of bed offers received:  04/29/2013 Patient chooses bed at  Physician recommends and patient chooses bed at    Patient to be transferred to home on   Patient to be transferred to home by   The following physician request were entered in Epic:   Additional Comments: 04/29/13 - Patient informed CSW that hedoes not want to go to a skilled facility for rehab and will discharge home.

## 2013-04-30 DIAGNOSIS — R5383 Other fatigue: Secondary | ICD-10-CM

## 2013-04-30 DIAGNOSIS — A419 Sepsis, unspecified organism: Secondary | ICD-10-CM

## 2013-04-30 DIAGNOSIS — R5381 Other malaise: Secondary | ICD-10-CM

## 2013-04-30 LAB — CBC
HCT: 32.4 % — ABNORMAL LOW (ref 39.0–52.0)
Hemoglobin: 10.2 g/dL — ABNORMAL LOW (ref 13.0–17.0)
MCH: 26.6 pg (ref 26.0–34.0)
MCHC: 31.5 g/dL (ref 30.0–36.0)
MCV: 84.4 fL (ref 78.0–100.0)
Platelets: 237 10*3/uL (ref 150–400)
RBC: 3.84 MIL/uL — ABNORMAL LOW (ref 4.22–5.81)
RDW: 14.9 % (ref 11.5–15.5)
WBC: 16.6 10*3/uL — ABNORMAL HIGH (ref 4.0–10.5)

## 2013-04-30 LAB — RENAL FUNCTION PANEL
ALBUMIN: 2.5 g/dL — AB (ref 3.5–5.2)
BUN: 38 mg/dL — AB (ref 6–23)
CALCIUM: 10.1 mg/dL (ref 8.4–10.5)
CO2: 23 mEq/L (ref 19–32)
Chloride: 92 mEq/L — ABNORMAL LOW (ref 96–112)
Creatinine, Ser: 7.94 mg/dL — ABNORMAL HIGH (ref 0.50–1.35)
GFR calc Af Amer: 7 mL/min — ABNORMAL LOW (ref >90)
GFR calc non Af Amer: 6 mL/min — ABNORMAL LOW (ref >90)
Glucose, Bld: 148 mg/dL — ABNORMAL HIGH (ref 70–99)
PHOSPHORUS: 6.3 mg/dL — AB (ref 2.3–4.6)
Potassium: 4.1 mEq/L (ref 3.7–5.3)
SODIUM: 136 meq/L — AB (ref 137–147)

## 2013-04-30 LAB — GLUCOSE, CAPILLARY
GLUCOSE-CAPILLARY: 154 mg/dL — AB (ref 70–99)
Glucose-Capillary: 146 mg/dL — ABNORMAL HIGH (ref 70–99)

## 2013-04-30 MED ORDER — COLCHICINE 0.6 MG PO TABS: 0.3000 mg | ORAL_TABLET | Freq: Every day | ORAL | Status: DC

## 2013-04-30 MED ORDER — LIDOCAINE-PRILOCAINE 2.5-2.5 % EX CREA: 1.0000 "application " | TOPICAL_CREAM | CUTANEOUS | Status: DC | PRN

## 2013-04-30 MED ORDER — SODIUM CHLORIDE 0.9 % IV SOLN: 100.0000 mL | INTRAVENOUS | Status: DC | PRN

## 2013-04-30 MED ORDER — HYDROMORPHONE HCL PF 1 MG/ML IJ SOLN
INTRAMUSCULAR | Status: AC
Start: 2013-04-30 — End: 2013-04-30
  Administered 2013-04-30: 0.5 mg via INTRAVENOUS
  Filled 2013-04-30: qty 1

## 2013-04-30 MED ORDER — NEPRO/CARBSTEADY PO LIQD: 237.0000 mL | ORAL | Status: DC | PRN

## 2013-04-30 MED ORDER — PENTAFLUOROPROP-TETRAFLUOROETH EX AERO: 1.0000 "application " | INHALATION_SPRAY | CUTANEOUS | Status: DC | PRN

## 2013-04-30 MED ORDER — RENA-VITE PO TABS: 1.0000 | ORAL_TABLET | Freq: Every day | ORAL | Status: DC

## 2013-04-30 MED ORDER — VANCOMYCIN HCL IN DEXTROSE 1-5 GM/200ML-% IV SOLN: 1000.0000 mg | INTRAVENOUS | Status: DC

## 2013-04-30 MED ORDER — ALTEPLASE 2 MG IJ SOLR
2.0000 mg | Freq: Once | INTRAMUSCULAR | Status: DC | PRN
  Filled 2013-04-30: qty 2

## 2013-04-30 MED ORDER — ONDANSETRON HCL 4 MG/2ML IJ SOLN
INTRAMUSCULAR | Status: AC
Start: 2013-04-30 — End: 2013-04-30
  Administered 2013-04-30: 4 mg via INTRAVENOUS
  Filled 2013-04-30: qty 2

## 2013-04-30 MED ORDER — HEPARIN SODIUM (PORCINE) 1000 UNIT/ML DIALYSIS
1000.0000 [IU] | INTRAMUSCULAR | Status: DC | PRN
Start: 2013-04-30 — End: 2013-04-30
  Filled 2013-04-30: qty 1

## 2013-04-30 MED ORDER — OXYCODONE HCL 5 MG PO TABS: 5.0000 mg | ORAL_TABLET | Freq: Four times a day (QID) | ORAL | Status: DC | PRN

## 2013-04-30 MED ORDER — LIDOCAINE HCL (PF) 1 % IJ SOLN: 5.0000 mL | INTRAMUSCULAR | Status: DC | PRN

## 2013-04-30 MED ORDER — CALCIUM ACETATE 667 MG PO CAPS: 667.0000 mg | ORAL_CAPSULE | Freq: Three times a day (TID) | ORAL | Status: DC

## 2013-04-30 MED ORDER — LOPERAMIDE HCL 2 MG PO CAPS: 2.0000 mg | ORAL_CAPSULE | ORAL | Status: DC | PRN

## 2013-04-30 NOTE — Progress Notes (Signed)
[facility] Progress Note  Subjective:   Pain controlled, no emerging complaints  Objective Filed Vitals:   04/29/13 1000 04/29/13 1745 04/29/13 2127 04/30/13 0546  BP: 112/71 126/69 123/71 124/77  Pulse: 66 64 63 65  Temp: 98 F (36.7 C) 98.1 F (36.7 C) 98.8 F (37.1 C) 98.6 F (37 C)  TempSrc: Oral Oral Oral Oral  Resp: 18 19 18 18   Height:      Weight:   75.3 kg (166 lb 0.1 oz)   SpO2: 98% 96% 99% 96%   Physical Exam General: sleepy, cooperative, NAD Heart: RRR, 2/6 murmur Lungs: CTA bilat, no appreciable wheezes or rhonchi Abdomen: Soft, NT, + BS Extremities: SCDs present, no pretibial edema, L transmet amp wrapped/ dressings clean and dry Dialysis Access: L AVF + bruit  D[facility]  4h 400/800 85.5kg Heparin 8700 LUA AVF  Hectorol 1 EPO none   Assessment/Plan: 1. Wound infection left foot / MRSA bacteremia - s/p L great toe amputation 3/25, irrigation & debridement 4/9 per Dr. Kellie Simmering, wound culture + for MRSA, on Vancomycin; TEE 4/7 showed moderate LVH, EF 65%, no vegetation; L transmet amputation per Dr. Sharol Given 4/15. ID recs continuation of Vanc for 8 weeks post op. They have signed off and will follow op. 2. ESRD - HD on MWF @ [place], K 4.2. HD today 3. HTN/Volume - SBPs 976B on Bystolic 5 mg qhs, Doxazosin dc'd; Try for 2L today. Needs lower edw at d/c 4. Anemia - Hgb 10.8, stable this admit.  5. Sec HPT - Ca 9.7 (10.8 corrected), P 5.1; 2Ca bath, Hectorol 1 mcg on hold sec to high Ca, no binders.  6. Nutrition - Alb 2.6, carb-mod renal diet, multivitamin, supplement.  7. DM - insulin per primary. 8. Dispo - Refusing SNF per SW note. HH to be coordinated.  [name]. Rhodia Albright [facility] Pager [phone] 04/30/2013,8:35 AM  LOS: 14 days   I have seen and examined this patient and agree with plan per Sonnie Alamo.  He says his brother is on his way in to help work with PT to see if he can handle him at home . Bennie Dallas 04/30/2013 9:56 AM Additional Objective Labs: Basic Metabolic Panel:  Recent Labs Lab 04/27/13 0620 04/28/13 0902 04/29/13 0510  NA 136* 135* 139  K 4.1 4.1 4.2  CL 92* 91* 95*  CO2 21 22 26   GLUCOSE 99 125* 132*  BUN 28* 40* 21  CREATININE 6.27* 8.02* 5.69*  CALCIUM 9.3 9.5 9.7  PHOS 5.6* 6.3* 5.1*   Liver Function Tests:  Recent Labs Lab 04/27/13 0620 04/28/13 0902 04/29/13 0510  ALBUMIN 2.4* 2.4* 2.6*   CBC:  Recent Labs Lab 04/25/13 0400 04/26/13 1233 04/27/13 0620 04/28/13 0902 04/29/13 0510  WBC 14.5* 13.9* 13.7* 12.5* 15.1*  HGB 11.1* 11.1* 11.4* 10.2* 10.8*  HCT 35.5* 34.8* 36.8* 32.3* 34.5*  MCV 86.0 83.9 85.6 83.7 85.2  PLT 298 334 260 261 220   Blood Culture    Component Value Date/Time   SDES WOUND LEFT FOOT 04/22/2013 1037   SPECREQUEST LEFT FIRST TOE PT ON VANCO VANCO 04/22/2013 1037   CULT  Value: ABUNDANT METHICILLIN RESISTANT STAPHYLOCOCCUS AUREUS Note: RIFAMPIN AND GENTAMICIN SHOULD NOT BE USED AS SINGLE DRUGS FOR TREATMENT OF STAPH INFECTIONS. This organism DOES NOT demonstrate inducible Clindamycin resistance in vitro. CRITICAL RESULT CALLED TO, READ BACK BY AND VERIFIED WITH: ALBERT R @ 7:40AM  04/25/13 BY DWEEKS Performed at [facility] 04/22/2013 1037   REPTSTATUS 04/25/2013 FINAL 04/22/2013 1037    CBG:  Recent Labs Lab 04/29/13 0810 04/29/13 1136 04/29/13 1747 04/29/13 2125 04/30/13 0821  GLUCAP 134* 240* 113* 150* 146*   Studies/Results: No results found.  Medications: . sodium chloride 35 mL/hr (04/26/13 1709)  . sodium chloride     . calcium acetate  667 mg Oral TID WC  . colchicine  0.3 mg Oral Daily  . collagenase   Topical Daily  . feeding supplement (NEPRO CARB STEADY)  237 mL Oral BID BM  . heparin  5,000 Units Subcutaneous 3 times per day  . insulin aspart  0-5 Units Subcutaneous QHS  . insulin aspart  0-9 Units Subcutaneous TID WC  . levothyroxine  175 mcg Oral QAC breakfast  . multivitamin   1 tablet Oral QHS  . nebivolol  5 mg Oral QHS  . sodium chloride  3 mL Intravenous Q12H  . vancomycin  1,000 mg Intravenous Q M,W,F-HD

## 2013-04-30 NOTE — Discharge Summary (Signed)
Physician Discharge Summary  Jack [name] DUK:[id number] DOB: 11/19/1945 DOA: 04/16/2013  PCP: [name], MD  Admit date: 04/16/2013 Discharge date: 04/30/2013  Time spent: >30 minutes  Recommendations for Outpatient Follow-up:  Follow-up Information   Follow up with [name],MARCUS V, MD In 2 weeks.   Specialty:  Orthopedic Surgery   Contact information:   [place] [address] [phone]       Follow up with [name], MD. (In 1- 2 weeks, call for appointment upon discharge)    Specialty:  Family Medicine   Contact information:   [address] [place] [address] [phone]       Follow up with Alcide Evener, MD. (In 2 weeks, call for appointment upon discharge)    Specialty:  Infectious Diseases   Contact information:   [address] [place] [address] [phone]       Discharge Diagnoses:  Principal Problem:   MRSA bacteremia Active Problems:   Essential hypertension, benign   Diabetes mellitus type 1, controlled, with complications   Anemia in chronic kidney disease   End stage renal disease   Peripheral vascular disease, unspecified   Diabetes mellitus   Toe gangrene   Wound infection after surgery   Fever, unspecified   Diarrhea   Discharge Condition: Improved/stable  Diet recommendation: Renal  Filed Weights   04/29/13 2127 04/30/13 1050 04/30/13 1517  Weight: 75.3 kg (166 lb 0.1 oz) 74.4 kg (164 lb 0.4 oz) 72.3 kg (159 lb 6.3 oz)    History of present illness:  Jack [name] is a 68 y.o. male, with past medical history significant for end-stage renal disease on hemodialysis and peripheral vascular disease, who had a recent first toe amputation of the left foot on March 25 presenting with fever, swelling and pain and redness in the surgical site area. In the emergency room the patient was noted to have a white blood cell count of 14,000 with a temperature of 100.9. I was  asked to admit for postop wound infection and possible below knee amputation on the left by vascular surgery. He was admitted for further evaluation and management.   Hospital Course:  #1 MRSA bacteremia  As discussed above, upon admission patient was placed on empiric antibiotics and blood and wound cultures obtained. Likely seeded from wound infection. The blood cultures grew MRSA and wound cultures aalso grew abundant staph aureus>>MRSA per sensitivities   MRI of the foot was done and negative for abscess or osteomyelitis.infectious disease was consulted and followed patient and assisted with antibiotic recommendations. A TEE 04/20/2013 was negative for SBE with ejection fraction of 65%. Patient currently afebrile, white count today prior to discharge is 16.6.  Infectious disease followed patient and have recommended a total of 8 weeks of antibiotics-IV vancomycin which she'll be receiving dialysis per renal and he is to followup with Dr. Drucilla Schmidt in 2 weeks. #2 poorly healing left first toe amputation  Patient with a gangrenous foot. Patient is status post I&D of left first toe amputation wound per Dr. Kellie Simmering. Patient was seen by Dr. Sharol Given in consultation and patient underwent a left transmetatarsal amputation on 04/26/2013. Continue IV antibiotics as discussed above.  -Patient declined SNF, which was recommended per PT #3 diabetes mellitus type 1  Stable. Patient was cut with sliding scale insulin in the hospital. His to resume his oral hypoglycemics upon discharge and followup with his PCP. #4 hypertension  Stable. Continue Cardura,bystolic.  #  5 hypothyroidism  Continue Synthroid.  #6 end-stage renal disease on hemodialysis  Patient has dialysis Monday Wednesdays and Fridays. He is to followup outpatient dialysis as directed per renal #7 anemia of chronic disease  Likely secondary to problem #6. H&H stable follow.  #8 diarrhea  C. difficile PCR is negative. May be secondary to Nephro-Vite.  Patient refusing Nephro-Vite as he feels this maybe causing his diarrhea. Continue Imodium as needed.  Code Status: full  Family Communication: updated patient.  Disposition Plan: home when medically stable.  Consultants:  Infectious diseases: Dr. Lucianne Lei dam 04/19/2013  Nephrology: Dr. Posey Pronto 04/17/2013  Vascular surgery Dr. Donnetta Hutching 04/17/2013  Orthopedics: Dr. Sharol Given 04/23/2013 Procedures:  I and D left first toe amputation wound 04/22/2013 per Dr. Kellie Simmering  MRI left foot 04/18/2013  MRI left ankle 04/17/2013  TEE 04/20/2013  Left foot transmetatarsal amputation 04/26/2013 per Dr.[name]     Discharge Exam: Filed Vitals:   04/30/13 1559  BP: 100/65  Pulse: 76  Temp: 98.1 F (36.7 C)  Resp: 18   Exam:  General: NAD  Cardiovascular: RRR  Respiratory: CTAB  Abdomen: SOFT, NONTENDER, NONDISTENDED, POSITIVE BOWEL SOUNDS.  Musculoskeletal: NO CLUBBING CYANOSIS OR EDEMA..L. transmetatarsal amp amputation with dressing clean and dry     Discharge Instructions You were cared for by a hospitalist during your hospital stay. If you have any questions about your discharge medications or the care you received while you were in the hospital after you are discharged, you can call the unit and asked to speak with the hospitalist on call if the hospitalist that took care of you is not available. Once you are discharged, your primary care physician will handle any further medical issues. Please note that NO REFILLS for any discharge medications will be authorized once you are discharged, as it is imperative that you return to your primary care physician (or establish a relationship with a primary care physician if you do not have one) for your aftercare needs so that they can reassess your need for medications and monitor your lab values.  Discharge Orders   Future Appointments Provider Department Dept Phone   06/29/2013 9:00 AM [facility] [phone]    06/29/2013 9:30 AM [facility]-Dg 4 (Chest) [facility] [phone]   07/06/2013 9:00 AM [name], MD [facility] [phone]   Future Orders Complete By Expires   Diet renal [id number]  As directed    Increase activity slowly  As directed        Medication List         aspirin EC 81 MG tablet  Take 81 mg by mouth daily.     multivitamin Tabs tablet  Take 1 tablet by mouth at bedtime.     b complex-vitamin c-folic acid 0.8 MG Tabs tablet  Take 1 tablet by mouth daily.     calcium acetate 667 MG capsule  Commonly known as:  PHOSLO  Take 1 capsule (667 mg total) by mouth 3 (three) times daily with meals.     colchicine 0.6 MG tablet  Take 0.5 tablets (0.3 mg total) by mouth daily.     doxazosin 2 MG tablet  Commonly known as:  CARDURA  Take 2 mg by mouth at bedtime.     doxercalciferol 4 MCG/2ML injection  Commonly known as:  HECTOROL  Inject 0.5 mLs (1 mcg total) into the vein every Monday, Wednesday, and Friday with hemodialysis.  feeding supplement (NEPRO CARB STEADY) Liqd  Take 237 mLs by mouth 2 (two) times daily between meals.     feeding supplement (NEPRO CARB STEADY) Liqd  Take 237 mLs by mouth as needed (missed meal during dialysis.).     levothyroxine 175 MCG tablet  Commonly known as:  SYNTHROID, LEVOTHROID  Take 175 mcg by mouth daily before breakfast.     loperamide 2 MG capsule  Commonly known as:  IMODIUM  Take 1 capsule (2 mg total) by mouth as needed for diarrhea or loose stools.     nebivolol 5 MG tablet  Commonly known as:  BYSTOLIC  Take 5 mg by mouth at bedtime.     ONGLYZA 5 MG Tabs tablet  Generic drug:  saxagliptin HCl  Take 5 mg by mouth at bedtime.     oxyCODONE 5 MG immediate release tablet  Commonly known as:  Oxy IR/ROXICODONE  Take 1 tablet (5 mg total) by mouth every 6 (six) hours as needed for severe pain.     sodium chloride 0.9 % SOLN 100 mL with ferric  gluconate 12.5 MG/ML SOLN 125 mg  Inject 125 mg into the vein every Monday, Wednesday, and Friday with hemodialysis.     vancomycin 1 GM/200ML Soln  Commonly known as:  VANCOCIN  Inject 200 mLs (1,000 mg total) into the vein every Monday, Wednesday, and Friday with hemodialysis.       Allergies  Allergen Reactions  . Lisinopril Cough  . Ivp Dye [Iodinated Diagnostic Agents] Hives  . Morphine And Related Other (See Comments)    Bradycardia states patient       Follow-up Information   Follow up with [name],MARCUS V, MD In 2 weeks.   Specialty:  Orthopedic Surgery   Contact information:   [place] [address] [phone]       Follow up with [name], MD. (In 1- 2 weeks, call for appointment upon discharge)    Specialty:  Family Medicine   Contact information:   [address] [place] [address] [phone]       Follow up with Alcide Evener, MD. (In 2 weeks, call for appointment upon discharge)    Specialty:  Infectious Diseases   Contact information:   [address] [place] [address] [phone]        The results of significant diagnostics from this hospitalization (including imaging, microbiology, ancillary and laboratory) are listed below for reference.    Significant Diagnostic Studies: Mr Foot Left Wo Contrast  04/18/2013   CLINICAL DATA:  Recent great toe amputation. Surgical wound infection.  EXAM: MRI OF THE LEFT FOREFOOT WITHOUT CONTRAST  TECHNIQUE: Multiplanar, multisequence MR imaging was performed. No intravenous contrast was administered.  COMPARISON:  Radiograph dated 03/23/2013  FINDINGS: The great toe has been amputated. There is no bone destruction or edema in the first metatarsal. There is slight edema in the soft tissues adjacent to the head of the first metatarsal deep to the operative wound.  The other bones of the forefoot are essentially normal.  The adjacent muscles and tendons  appear normal. No joint effusions. There is slight sclerosis and irregularity of the sesamoids at the head of the first metatarsal. This is not significant.  IMPRESSION: No evidence of abscess or osteomyelitis. Small amount of fluid adjacent to the head of the first metatarsal is consistent with normal postsurgical seroma.   Electronically Signed   By: Clair Gulling  Maxwell M.D.   On: 04/18/2013 13:24   Mr Ankle Left  Wo Contrast  04/17/2013   CLINICAL DATA:  Fever, pain, swelling, and redness at the site of amputation of the left great toe. Elevated white blood count. Wound infection.  EXAM: MRI OF THE LEFT ANKLE WITHOUT CONTRAST  TECHNIQUE: Multiplanar, multisequence MR imaging of the ankle was performed. No intravenous contrast was administered.  COMPARISON:  Radiographs dated 03/23/2013  FINDINGS: There is circumferential edema in the subcutaneous tissues of the ankle and on the dorsum of the foot, nonspecific.  TENDONS  Peroneal: Normal.  Posteromedial: Normal.  Anterior: Normal.  Achilles: Normal.  Plantar Fascia: No acute abnormality. Chronic degenerative changes in the origin of the medial band of the plantar fascia.  LIGAMENTS  Lateral: Normal.  Medial: Normal.  .  CARTILAGE  Ankle Joint: Normal.  Subtalar Joints/Sinus Tarsi: Normal.  Bones: Minimal dorsal spurring on the distal talus. Minimal posterior spurring on the distal tibia.  IMPRESSION: No evidence of osteomyelitis or abscess. Edema in the subcutaneous tissues around the ankle and dorsum of the hindfoot, nonspecific.   Electronically Signed   By: Rozetta Nunnery M.D.   On: 04/17/2013 15:23    Microbiology: Recent Results (from the past 240 hour(s))  WOUND CULTURE     Status: None   Collection Time    04/22/13 10:37 AM      Result Value Ref Range Status   Specimen Description WOUND LEFT FOOT   Final   Special Requests LEFT FIRST TOE PT ON VANCO VANCO   Final   Gram Stain     Final   Value: FEW WBC PRESENT, PREDOMINANTLY PMN     FEW SQUAMOUS  EPITHELIAL CELLS PRESENT     MODERATE GRAM POSITIVE COCCI IN CLUSTERS     MODERATE GRAM POSITIVE RODS     Performed at [facility]   Culture     Final   Value: ABUNDANT METHICILLIN RESISTANT STAPHYLOCOCCUS AUREUS     Note: RIFAMPIN AND GENTAMICIN SHOULD NOT BE USED AS SINGLE DRUGS FOR TREATMENT OF STAPH INFECTIONS. This organism DOES NOT demonstrate inducible Clindamycin resistance in vitro. CRITICAL RESULT CALLED TO, READ BACK BY AND VERIFIED WITH: ALBERT R @ 7:40AM      04/25/13 BY DWEEKS     Performed at [facility]   Report Status 04/25/2013 FINAL   Final   Organism ID, Bacteria METHICILLIN RESISTANT STAPHYLOCOCCUS AUREUS   Final  CLOSTRIDIUM DIFFICILE BY PCR     Status: None   Collection Time    04/24/13 12:25 AM      Result Value Ref Range Status   C difficile by pcr NEGATIVE  NEGATIVE Final     Labs: Basic Metabolic Panel:  Recent Labs Lab 04/26/13 1233 04/27/13 0620 04/28/13 0902 04/29/13 0510 04/30/13 1000  NA 138 136* 135* 139 136*  K 3.8 4.1 4.1 4.2 4.1  CL 93* 92* 91* 95* 92*  CO2 24 21 22 26 23   GLUCOSE 85 99 125* 132* 148*  BUN 43* 28* 40* 21 38*  CREATININE 9.05* 6.27* 8.02* 5.69* 7.94*  CALCIUM 9.7 9.3 9.5 9.7 10.1  PHOS 6.3* 5.6* 6.3* 5.1* 6.3*   Liver Function Tests:  Recent Labs Lab 04/26/13 1233 04/27/13 0620 04/28/13 0902 04/29/13 0510 04/30/13 1000  ALBUMIN 2.5* 2.4* 2.4* 2.6* 2.5*   No results found for this basename: LIPASE, AMYLASE,  in the last 168 hours No results found for this basename: AMMONIA,  in the last 168  hours CBC:  Recent Labs Lab 04/26/13 1233 04/27/13 0620 04/28/13 0902 04/29/13 0510 04/30/13 1000  WBC 13.9* 13.7* 12.5* 15.1* 16.6*  HGB 11.1* 11.4* 10.2* 10.8* 10.2*  HCT 34.8* 36.8* 32.3* 34.5* 32.4*  MCV 83.9 85.6 83.7 85.2 84.4  PLT 334 260 261 220 237   Cardiac Enzymes: No results found for this basename: CKTOTAL, CKMB, CKMBINDEX, TROPONINI,  in the last 168 hours BNP: BNP (last 3  results) No results found for this basename: PROBNP,  in the last 8760 hours CBG:  Recent Labs Lab 04/29/13 1136 04/29/13 1747 04/29/13 2125 04/30/13 0821 04/30/13 1559  GLUCAP 240* 113* 150* 146* 154*       Signed:  Roshan Salamon C Shauntay Brunelli  [facility] 04/30/2013, 4:37 PM

## 2013-04-30 NOTE — Progress Notes (Signed)
PT Cancellation Note  Patient Details Name: Jack [name] MRN: [id number] DOB: [birth year]   Cancelled Treatment:    Reason Eval/Treat Not Completed: Pain limiting ability to participate; patient c/o stomach cramps just back from dialysis.  Wished to try tomorrow.  Noted per daughter patient may be willing to go to rehab.  Discussed CIR as patient has 24 hour assist.  Will ask for rehab nurse to screen.     Max Sane 04/30/2013, 4:35 PM

## 2013-04-30 NOTE — Procedures (Signed)
Pt seen on HD.  Ap 150 Vp 160  BFR 350, increase to 400.  Tolerating HD well so far.

## 2013-04-30 NOTE — Care Management Note (Addendum)
CARE MANAGEMENT NOTE 04/30/2013  Patient:  YADIER, BRAMHALL   Account Number:  [id number]  Date Initiated:  04/19/2013  Documentation initiated by:  Lizabeth Leyden  Subjective/Objective Assessment:   admitted with fever, redness, pain at site of toe amputation on 04/07/2013  medical hx: ESRD/HD, DM     Action/Plan:   progression of care and discharge planning  active with [facility] HHPT   Anticipated DC Date:  04/30/2013   Anticipated DC Plan:  [place]  CM consult      [facility] Choice  Resumption Of Svcs/PTA Provider   Choice offered to / List presented to:  C-1 Patient   DME arranged  3-N-1      DME agency  [facility] arranged  HH-2 PT    [facility] RN  [facility] agency  [place].   Status of service:  Completed, signed off Medicare Important Message given?   (If response is "NO", the following Medicare IM given date fields will be blank) Date Medicare IM given:   Date Additional Medicare IM given:    Discharge Disposition:  [place]  Per UR Regulation:    If discussed at Long Length of Stay Meetings, dates discussed:    Comments:  04/30/2013 Met with pt who has decided to d/c to home where his two daughters provide his care, his daughters are in agreement. He selected [facility] for [facility] services as he has used that agency previously. 3:1 commode ordered per pt request, he states that he has several walkers.  Jasmine Pang RN MPH, case manager, [phone] [facility] [place] services resumed.     [place] notified of plan to d/c today.   [place] MPH, [place]  04/23/2013  [place], [place] Met with patient regarding discharge planning, home health services. He is currently receiving HHPT with [facility] and wishes to continue. He does not have a BSC, he has a wc but it is too big and his therapist is working on getting a smaller one.  [facility]/[place]  called to update on possible discharge needs.

## 2013-05-01 ENCOUNTER — Emergency Department (HOSPITAL_COMMUNITY): Payer: Medicare Other

## 2013-05-01 ENCOUNTER — Inpatient Hospital Stay (HOSPITAL_COMMUNITY)
Admission: EM | Admit: 2013-05-01 | Discharge: 2013-05-07 | DRG: 871 | Disposition: A | Discharge status: Skilled Nursing Facility | Payer: Medicare Other | Attending: Internal Medicine | Admitting: Internal Medicine

## 2013-05-01 ENCOUNTER — Encounter (HOSPITAL_COMMUNITY): Payer: Self-pay | Admitting: Emergency Medicine

## 2013-05-01 DIAGNOSIS — L98499 Non-pressure chronic ulcer of skin of other sites with unspecified severity: Secondary | ICD-10-CM

## 2013-05-01 DIAGNOSIS — E119 Type 2 diabetes mellitus without complications: Secondary | ICD-10-CM

## 2013-05-01 DIAGNOSIS — L89109 Pressure ulcer of unspecified part of back, unspecified stage: Secondary | ICD-10-CM | POA: Diagnosis present

## 2013-05-01 DIAGNOSIS — R531 Weakness: Secondary | ICD-10-CM

## 2013-05-01 DIAGNOSIS — Y835 Amputation of limb(s) as the cause of abnormal reaction of the patient, or of later complication, without mention of misadventure at the time of the procedure: Secondary | ICD-10-CM | POA: Diagnosis present

## 2013-05-01 DIAGNOSIS — E8770 Fluid overload, unspecified: Secondary | ICD-10-CM

## 2013-05-01 DIAGNOSIS — I96 Gangrene, not elsewhere classified: Secondary | ICD-10-CM

## 2013-05-01 DIAGNOSIS — M79609 Pain in unspecified limb: Secondary | ICD-10-CM

## 2013-05-01 DIAGNOSIS — E108 Type 1 diabetes mellitus with unspecified complications: Secondary | ICD-10-CM | POA: Diagnosis present

## 2013-05-01 DIAGNOSIS — R509 Fever, unspecified: Secondary | ICD-10-CM

## 2013-05-01 DIAGNOSIS — G9340 Encephalopathy, unspecified: Secondary | ICD-10-CM

## 2013-05-01 DIAGNOSIS — C9000 Multiple myeloma not having achieved remission: Secondary | ICD-10-CM | POA: Diagnosis present

## 2013-05-01 DIAGNOSIS — E871 Hypo-osmolality and hyponatremia: Secondary | ICD-10-CM

## 2013-05-01 DIAGNOSIS — E872 Acidosis, unspecified: Secondary | ICD-10-CM

## 2013-05-01 DIAGNOSIS — Z7982 Long term (current) use of aspirin: Secondary | ICD-10-CM

## 2013-05-01 DIAGNOSIS — M9080 Osteopathy in diseases classified elsewhere, unspecified site: Secondary | ICD-10-CM | POA: Diagnosis present

## 2013-05-01 DIAGNOSIS — J9601 Acute respiratory failure with hypoxia: Secondary | ICD-10-CM

## 2013-05-01 DIAGNOSIS — L8992 Pressure ulcer of unspecified site, stage 2: Secondary | ICD-10-CM | POA: Diagnosis present

## 2013-05-01 DIAGNOSIS — D631 Anemia in chronic kidney disease: Secondary | ICD-10-CM

## 2013-05-01 DIAGNOSIS — Z4931 Encounter for adequacy testing for hemodialysis: Secondary | ICD-10-CM

## 2013-05-01 DIAGNOSIS — E861 Hypovolemia: Secondary | ICD-10-CM | POA: Diagnosis present

## 2013-05-01 DIAGNOSIS — L8991 Pressure ulcer of unspecified site, stage 1: Secondary | ICD-10-CM | POA: Diagnosis present

## 2013-05-01 DIAGNOSIS — Z992 Dependence on renal dialysis: Secondary | ICD-10-CM

## 2013-05-01 DIAGNOSIS — N039 Chronic nephritic syndrome with unspecified morphologic changes: Secondary | ICD-10-CM

## 2013-05-01 DIAGNOSIS — Z833 Family history of diabetes mellitus: Secondary | ICD-10-CM

## 2013-05-01 DIAGNOSIS — R627 Adult failure to thrive: Secondary | ICD-10-CM

## 2013-05-01 DIAGNOSIS — A4902 Methicillin resistant Staphylococcus aureus infection, unspecified site: Secondary | ICD-10-CM | POA: Diagnosis present

## 2013-05-01 DIAGNOSIS — E039 Hypothyroidism, unspecified: Secondary | ICD-10-CM

## 2013-05-01 DIAGNOSIS — R112 Nausea with vomiting, unspecified: Secondary | ICD-10-CM | POA: Diagnosis present

## 2013-05-01 DIAGNOSIS — M869 Osteomyelitis, unspecified: Secondary | ICD-10-CM

## 2013-05-01 DIAGNOSIS — Z8614 Personal history of Methicillin resistant Staphylococcus aureus infection: Secondary | ICD-10-CM | POA: Diagnosis present

## 2013-05-01 DIAGNOSIS — E1059 Type 1 diabetes mellitus with other circulatory complications: Secondary | ICD-10-CM | POA: Diagnosis present

## 2013-05-01 DIAGNOSIS — T8149XA Infection following a procedure, other surgical site, initial encounter: Secondary | ICD-10-CM

## 2013-05-01 DIAGNOSIS — A419 Sepsis, unspecified organism: Principal | ICD-10-CM | POA: Diagnosis present

## 2013-05-01 DIAGNOSIS — Z794 Long term (current) use of insulin: Secondary | ICD-10-CM

## 2013-05-01 DIAGNOSIS — S98919A Complete traumatic amputation of unspecified foot, level unspecified, initial encounter: Secondary | ICD-10-CM

## 2013-05-01 DIAGNOSIS — R001 Bradycardia, unspecified: Secondary | ICD-10-CM

## 2013-05-01 DIAGNOSIS — E1029 Type 1 diabetes mellitus with other diabetic kidney complication: Secondary | ICD-10-CM | POA: Diagnosis present

## 2013-05-01 DIAGNOSIS — E611 Iron deficiency: Secondary | ICD-10-CM

## 2013-05-01 DIAGNOSIS — R6510 Systemic inflammatory response syndrome (SIRS) of non-infectious origin without acute organ dysfunction: Secondary | ICD-10-CM | POA: Diagnosis present

## 2013-05-01 DIAGNOSIS — N186 End stage renal disease: Secondary | ICD-10-CM

## 2013-05-01 DIAGNOSIS — I10 Essential (primary) hypertension: Secondary | ICD-10-CM

## 2013-05-01 DIAGNOSIS — N058 Unspecified nephritic syndrome with other morphologic changes: Secondary | ICD-10-CM | POA: Diagnosis present

## 2013-05-01 DIAGNOSIS — Z48812 Encounter for surgical aftercare following surgery on the circulatory system: Secondary | ICD-10-CM

## 2013-05-01 DIAGNOSIS — E1069 Type 1 diabetes mellitus with other specified complication: Secondary | ICD-10-CM | POA: Diagnosis present

## 2013-05-01 DIAGNOSIS — I739 Peripheral vascular disease, unspecified: Secondary | ICD-10-CM

## 2013-05-01 DIAGNOSIS — Z87891 Personal history of nicotine dependence: Secondary | ICD-10-CM

## 2013-05-01 DIAGNOSIS — E43 Unspecified severe protein-calorie malnutrition: Secondary | ICD-10-CM

## 2013-05-01 DIAGNOSIS — I120 Hypertensive chronic kidney disease with stage 5 chronic kidney disease or end stage renal disease: Secondary | ICD-10-CM | POA: Diagnosis present

## 2013-05-01 DIAGNOSIS — R197 Diarrhea, unspecified: Secondary | ICD-10-CM

## 2013-05-01 DIAGNOSIS — N189 Chronic kidney disease, unspecified: Secondary | ICD-10-CM

## 2013-05-01 DIAGNOSIS — N2581 Secondary hyperparathyroidism of renal origin: Secondary | ICD-10-CM | POA: Diagnosis present

## 2013-05-01 DIAGNOSIS — I959 Hypotension, unspecified: Secondary | ICD-10-CM

## 2013-05-01 DIAGNOSIS — D472 Monoclonal gammopathy: Secondary | ICD-10-CM

## 2013-05-01 DIAGNOSIS — E869 Volume depletion, unspecified: Secondary | ICD-10-CM

## 2013-05-01 DIAGNOSIS — B9562 Methicillin resistant Staphylococcus aureus infection as the cause of diseases classified elsewhere: Secondary | ICD-10-CM

## 2013-05-01 DIAGNOSIS — M86179 Other acute osteomyelitis, unspecified ankle and foot: Secondary | ICD-10-CM

## 2013-05-01 DIAGNOSIS — N185 Chronic kidney disease, stage 5: Secondary | ICD-10-CM

## 2013-05-01 DIAGNOSIS — R7881 Bacteremia: Secondary | ICD-10-CM

## 2013-05-01 DIAGNOSIS — T8740 Infection of amputation stump, unspecified extremity: Secondary | ICD-10-CM | POA: Diagnosis present

## 2013-05-01 DIAGNOSIS — T8140XA Infection following a procedure, unspecified, initial encounter: Secondary | ICD-10-CM | POA: Diagnosis present

## 2013-05-01 LAB — CBC WITH DIFFERENTIAL/PLATELET
BASOS ABS: 0 10*3/uL (ref 0.0–0.1)
BASOS PCT: 0 % (ref 0–1)
EOS PCT: 0 % (ref 0–5)
Eosinophils Absolute: 0 10*3/uL (ref 0.0–0.7)
HEMATOCRIT: 35.2 % — AB (ref 39.0–52.0)
HEMOGLOBIN: 11 g/dL — AB (ref 13.0–17.0)
LYMPHS ABS: 1.4 10*3/uL (ref 0.7–4.0)
Lymphocytes Relative: 6 % — ABNORMAL LOW (ref 12–46)
MCH: 26.9 pg (ref 26.0–34.0)
MCHC: 31.3 g/dL (ref 30.0–36.0)
MCV: 86.1 fL (ref 78.0–100.0)
MONOS PCT: 6 % (ref 3–12)
Monocytes Absolute: 1.4 10*3/uL — ABNORMAL HIGH (ref 0.1–1.0)
NEUTROS ABS: 21.1 10*3/uL — AB (ref 1.7–7.7)
Neutrophils Relative %: 88 % — ABNORMAL HIGH (ref 43–77)
Platelets: 234 10*3/uL (ref 150–400)
RBC: 4.09 MIL/uL — ABNORMAL LOW (ref 4.22–5.81)
RDW: 15 % (ref 11.5–15.5)
WBC: 23.9 10*3/uL — AB (ref 4.0–10.5)

## 2013-05-01 LAB — COMPREHENSIVE METABOLIC PANEL
ALBUMIN: 2.6 g/dL — AB (ref 3.5–5.2)
ALT: 6 U/L (ref 0–53)
AST: 12 U/L (ref 0–37)
Alkaline Phosphatase: 102 U/L (ref 39–117)
BILIRUBIN TOTAL: 0.7 mg/dL (ref 0.3–1.2)
BUN: 26 mg/dL — ABNORMAL HIGH (ref 6–23)
CHLORIDE: 91 meq/L — AB (ref 96–112)
CO2: 28 mEq/L (ref 19–32)
CREATININE: 5.95 mg/dL — AB (ref 0.50–1.35)
Calcium: 10.3 mg/dL (ref 8.4–10.5)
GFR calc Af Amer: 10 mL/min — ABNORMAL LOW (ref >90)
GFR calc non Af Amer: 9 mL/min — ABNORMAL LOW (ref >90)
Glucose, Bld: 170 mg/dL — ABNORMAL HIGH (ref 70–99)
Potassium: 3.8 mEq/L (ref 3.7–5.3)
Sodium: 138 mEq/L (ref 137–147)
TOTAL PROTEIN: 9 g/dL — AB (ref 6.0–8.3)

## 2013-05-01 LAB — CBG MONITORING, ED: Glucose-Capillary: 165 mg/dL — ABNORMAL HIGH (ref 70–99)

## 2013-05-01 LAB — I-STAT CG4 LACTIC ACID, ED: Lactic Acid, Venous: 3.33 mmol/L — ABNORMAL HIGH (ref 0.5–2.2)

## 2013-05-01 LAB — LIPASE, BLOOD: Lipase: 7 U/L — ABNORMAL LOW (ref 11–59)

## 2013-05-01 MED ORDER — PIPERACILLIN-TAZOBACTAM IN DEX 2-0.25 GM/50ML IV SOLN
2.2500 g | Freq: Three times a day (TID) | INTRAVENOUS | Status: DC
  Administered 2013-05-02: 2.25 g via INTRAVENOUS
  Filled 2013-05-01 (×3): qty 50

## 2013-05-01 MED ORDER — VANCOMYCIN HCL IN DEXTROSE 1-5 GM/200ML-% IV SOLN
1000.0000 mg | INTRAVENOUS | Status: DC
Start: 2013-05-03 — End: 2013-05-03
  Filled 2013-05-01: qty 200

## 2013-05-01 MED ORDER — ONDANSETRON HCL 4 MG/2ML IJ SOLN
4.0000 mg | Freq: Once | INTRAMUSCULAR | Status: AC
  Administered 2013-05-01: 4 mg via INTRAVENOUS
  Filled 2013-05-01: qty 2

## 2013-05-01 MED ORDER — VANCOMYCIN HCL IN DEXTROSE 1-5 GM/200ML-% IV SOLN
1000.0000 mg | Freq: Once | INTRAVENOUS | Status: DC
  Filled 2013-05-01: qty 200

## 2013-05-01 MED ORDER — SODIUM CHLORIDE 0.9 % IV BOLUS (SEPSIS)
1000.0000 mL | Freq: Once | INTRAVENOUS | Status: AC
  Administered 2013-05-01: 1000 mL via INTRAVENOUS

## 2013-05-01 MED ORDER — PIPERACILLIN-TAZOBACTAM 3.375 G IVPB 30 MIN
3.3750 g | Freq: Once | INTRAVENOUS | Status: AC
  Administered 2013-05-01: 3.375 g via INTRAVENOUS
  Filled 2013-05-01: qty 50

## 2013-05-01 MED ORDER — SODIUM CHLORIDE 0.9 % IV BOLUS (SEPSIS)
500.0000 mL | Freq: Once | INTRAVENOUS | Status: AC
  Administered 2013-05-01: 500 mL via INTRAVENOUS

## 2013-05-01 MED ORDER — PIPERACILLIN-TAZOBACTAM IN DEX 2-0.25 GM/50ML IV SOLN
2.2500 g | Freq: Three times a day (TID) | INTRAVENOUS | Status: DC
Start: 2013-05-01 — End: 2013-05-01
  Filled 2013-05-01 (×2): qty 50

## 2013-05-01 NOTE — ED Provider Notes (Signed)
CSN: [id number]     Arrival date & time 05/01/13  1735 History   First MD Initiated Contact with Patient 05/01/13 1847     Chief Complaint  Patient presents with  . Nausea  . Emesis  . Diarrhea     (Consider location/radiation/quality/duration/timing/severity/associated sxs/prior Treatment) HPI  This is a 68 y.o. male with past medical history of hypertension, diabetes, chronic kidney disease (on HD), presenting today one day after discharge due to gangrenous foot status post toe amputation on the left side, with continued nausea, vomiting, diarrhea, weakness, inability to ambulate, "coldness."  Patient was admitted on the third of this month and discharged yesterday.  Rehabilitation facilities were offered to him, but the patient refused upon discharge.  He has been receiving his antibiotic treatment during his dialysis sessions (last session yesterday). The patient denies chest pain, shortness of breath, abdominal pain, hematochezia, melena.  Past Medical History  Diagnosis Date  . Hypertension   . Diabetes mellitus without complication   . Shortness of breath   . Pneumonia     2012  . Heart murmur   . Glaucoma   . Multiple myeloma, without mention of having achieved remission 03/30/2012    Cytogenetic neg on 03/23/2012.  . End stage renal disease on dialysis   . Asthma   . Hyperparathyroidism, secondary renal   . Peripheral arterial disease    Past Surgical History  Procedure Laterality Date  . Thyroidectomy    . Cervical disc surgery    . Eye surgery      CATARACTS  . Insertion of dialysis catheter Right 03/19/2012    Procedure: INSERTION OF DIALYSIS CATHETER;  Surgeon: Mal Misty, MD;  Location: [place];  Service: Vascular;  Laterality: Right;  Right Internal Jugular  . Av fistula placement Left 03/25/2012    Procedure: ARTERIOVENOUS (AV) FISTULA CREATION;  Surgeon: Mal Misty, MD;  Location: [place];  Service: Vascular;  Laterality: Left;  . Ligation of competing  branches of arteriovenous fistula Left 05/08/2012    Procedure: LIGATION OF COMPETING BRANCHES OF ARTERIOVENOUS FISTULA;  Surgeon: Mal Misty, MD;  Location: [place];  Service: Vascular;  Laterality: Left;  Ultrasound guided  . Cardiac catheterization      approx 30 years ago  . Amputation Right 11/10/2012    Procedure: AMPUTATION FIRST and SECOND TOES Right Foot;  Surgeon: Elam Dutch, MD;  Location: [facility] OR;  Service: Vascular;  Laterality: Right;  . Transmetatarsal amputation Left 12/16/2012    Procedure: TRANSMETATARSAL AMPUTATION AND VAC PLACEMENT;  Surgeon: Elam Dutch, MD;  Location: [place];  Service: Vascular;  Laterality: Left;  . Amputation Left 04/07/2013    Procedure: AMPUTATION DIGIT- LEFT 1ST TOE;  Surgeon: Mal Misty, MD;  Location: [place];  Service: Vascular;  Laterality: Left;  . Tee without cardioversion N/A 04/20/2013    Procedure: TRANSESOPHAGEAL ECHOCARDIOGRAM (TEE);  Surgeon: Josue Hector, MD;  Location: [facility] ENDOSCOPY;  Service: Cardiovascular;  Laterality: N/A;  . I&d extremity Left 04/22/2013    Procedure: EGBTDVVOHY WVP DEBRIDEMENT LEFT FIRST TOE AMPUTATION WOUND ;  Surgeon: Mal Misty, MD;  Location: [place];  Service: Vascular;  Laterality: Left;  . Amputation Left 04/26/2013    Procedure: Left Foot Transmetatarsal Amputation;  Surgeon: Newt Minion, MD;  Location: [place];  Service: Orthopedics;  Laterality: Left;  . Toe amputation      D/C 04-30-13   Family History  Problem Relation Age of Onset  . Diabetes  Mother   . Cancer Mother     bone   . Kidney disease Father    History  Substance Use Topics  . Smoking status: Former Smoker    Types: Cigarettes    Quit date: 03/19/1982  . Smokeless tobacco: Never Used  . Alcohol Use: No     Comment: formerly    Review of Systems  Constitutional: Positive for chills. Negative for fever.  HENT: Negative for facial swelling.   Eyes: Negative for pain and visual disturbance.  Respiratory: Negative for chest  tightness and shortness of breath.   Cardiovascular: Negative for chest pain.  Gastrointestinal: Positive for nausea, vomiting and diarrhea.  Genitourinary: Negative for penile swelling.  Musculoskeletal: Positive for arthralgias.  Neurological: Negative for headaches.  Psychiatric/Behavioral: Negative for behavioral problems.      Allergies  Bee venom; Lisinopril; Ivp dye; and Morphine and related  Home Medications   Prior to Admission medications   Medication Sig Start Date End Date Taking? Authorizing Provider  aspirin EC 81 MG tablet Take 81 mg by mouth daily.   Yes Historical Provider, MD  b complex-vitamin c-folic acid (NEPHRO-VITE) 0.8 MG TABS tablet Take 1 tablet by mouth daily.   Yes Historical Provider, MD  calcium acetate (PHOSLO) 667 MG capsule Take 1 capsule (667 mg total) by mouth 3 (three) times daily with meals. 04/30/13  Yes Adeline Saralyn Pilar, MD  colchicine 0.6 MG tablet Take 0.5 tablets (0.3 mg total) by mouth daily. 04/30/13  Yes Adeline Saralyn Pilar, MD  doxazosin (CARDURA) 2 MG tablet Take 2 mg by mouth at bedtime.   Yes Historical Provider, MD  doxercalciferol (HECTOROL) 4 MCG/2ML injection Inject 0.5 mLs (1 mcg total) into the vein every Monday, Wednesday, and Friday with hemodialysis. 02/27/13  Yes Geradine Girt, DO  levothyroxine (SYNTHROID, LEVOTHROID) 175 MCG tablet Take 175 mcg by mouth daily before breakfast.   Yes Historical Provider, MD  loperamide (IMODIUM) 2 MG capsule Take 1 capsule (2 mg total) by mouth as needed for diarrhea or loose stools. 04/30/13  Yes Adeline Saralyn Pilar, MD  multivitamin (RENA-VIT) TABS tablet Take 1 tablet by mouth at bedtime. 04/30/13  Yes Adeline C Viyuoh, MD  nebivolol (BYSTOLIC) 5 MG tablet Take 5 mg by mouth at bedtime.   Yes Historical Provider, MD  oxyCODONE (OXY IR/ROXICODONE) 5 MG immediate release tablet Take 1 tablet (5 mg total) by mouth every 6 (six) hours as needed for severe pain. 04/30/13  Yes Adeline C Viyuoh, MD   saxagliptin HCl (ONGLYZA) 5 MG TABS tablet Take 5 mg by mouth daily.    Yes Historical Provider, MD  sodium chloride 0.9 % SOLN 100 mL with ferric gluconate 12.5 MG/ML SOLN 125 mg Inject 125 mg into the vein every Monday, Wednesday, and Friday with hemodialysis. 02/27/13  Yes Geradine Girt, DO  vancomycin (VANCOCIN) 1 GM/200ML SOLN Inject 200 mLs (1,000 mg total) into the vein every Monday, Wednesday, and Friday with hemodialysis. 04/30/13  Yes Adeline C Viyuoh, MD   BP 104/67  Pulse 74  Temp(Src) 99.3 F (37.4 C) (Oral)  Resp 16  SpO2 98% Physical Exam  Constitutional: He is oriented to person, place, and time. He appears well-developed and well-nourished. No distress.  HENT:  Head: Normocephalic and atraumatic.  Mouth/Throat: No oropharyngeal exudate.  Eyes: Conjunctivae are normal. Pupils are equal, round, and reactive to light. No scleral icterus.  Neck: Normal range of motion. No tracheal deviation present. No thyromegaly present.  Cardiovascular: Normal rate,  regular rhythm and normal heart sounds.  Exam reveals no gallop and no friction rub.   No murmur heard. Pulmonary/Chest: Effort normal and breath sounds normal. No stridor. No respiratory distress. He has no wheezes. He has no rales. He exhibits no tenderness.  Abdominal: Soft. He exhibits no distension and no mass. There is no tenderness. There is no rebound and no guarding.  Musculoskeletal: Normal range of motion. He exhibits no edema.  Left foot post-op wound is well approximated, sutures in place.  Scant amount of blood.  Positive for surrounding erythema   Neurological: He is alert and oriented to person, place, and time.  Skin: Skin is warm and dry. He is not diaphoretic.  Stage II sacral ulcer    ED Course  Procedures (including critical care time) Labs Review Labs Reviewed  COMPREHENSIVE METABOLIC PANEL - Abnormal; Notable for the following:    Chloride 91 (*)    Glucose, Bld 170 (*)    BUN 26 (*)     Creatinine, Ser 5.95 (*)    Total Protein 9.0 (*)    Albumin 2.6 (*)    GFR calc non Af Amer 9 (*)    GFR calc Af Amer 10 (*)    All other components within normal limits  LIPASE, BLOOD - Abnormal; Notable for the following:    Lipase 7 (*)    All other components within normal limits  CBC WITH DIFFERENTIAL - Abnormal; Notable for the following:    WBC 23.9 (*)    RBC 4.09 (*)    Hemoglobin 11.0 (*)    HCT 35.2 (*)    Neutrophils Relative % 88 (*)    Lymphocytes Relative 6 (*)    Neutro Abs 21.1 (*)    Monocytes Absolute 1.4 (*)    All other components within normal limits  BLOOD GAS, VENOUS  URINALYSIS, ROUTINE W REFLEX MICROSCOPIC    Imaging Review Dg Chest 2 View  05/01/2013   CLINICAL DATA:  Shortness of breath.  Asthma.  Nausea and vomiting.  EXAM: CHEST  2 VIEW  COMPARISON:  02/23/2013  FINDINGS: Mildly improved aeration of both lungs is noted. Elevation of right hemidiaphragm again seen. No evidence of pulmonary infiltrate or edema. No evidence of pleural effusion. Heart size and mediastinal contours are stable.  IMPRESSION: Stable elevation of right hemidiaphragm.  No active disease.   Electronically Signed   By: Earle Gell M.D.   On: 05/01/2013 21:34    MDM   Final diagnoses:  None    This is a 69 y.o. male with past medical history of hypertension, diabetes, chronic kidney disease (on HD), presenting today one day after discharge due to gangrenous foot status post toe amputation on the left side, with continued nausea, vomiting, diarrhea, weakness, inability to ambulate, "coldness."  Patient was admitted on the third of this month and discharged yesterday.  Rehabilitation facilities were offered to him, but the patient refused upon discharge.  He has been receiving his antibiotic treatment during his dialysis sessions (last session yesterday). The patient denies chest pain, shortness of breath, abdominal pain, hematochezia, melena.  The patient has soft blood pressure.  Is not tachycardic. He is mentating well.  The patient has a leukocytosis at this time. Must consider pulmonary, abdominal, cutaneous, urinary etiologies at this time. I have ordered chest x-ray, urinalysis; however patient only "occasionally" makes urine. He might not be able to produce urine at this time.  Examination of his sacral area reveals a stage II  ulcer. There is minimal erythema around the wound, so I deem this an unlikely source of infection. He does, however, have erythema around his left foot postop wound.  Considering recent discharge 2/2 bacteremia, likely from left gangrenous foot, I am suspicious that this is source of possible sepsis at this time.  Will fluid resuscitate, provide broad spectrum abx.  Will continue to monitor.    There is no increase in his creatinine, patient is mentating well, but his lactate is above 3 at this time.    Hospitalist has evaluated pt, will admit to tele bed.  Pt is being transported in stable condition.    I have discussed case and care has been guided by my attending physician, Dr. Stevie Kern.  Doy Hutching, MD 05/02/13 0111

## 2013-05-01 NOTE — H&P (Signed)
History and Physical       Hospital Admission Note Date: 05/01/2013  Patient name: Jack [name] Medical record number: [id number] Date of birth: 1945-03-02 Age: 68 y.o. Gender: male  PCP: [name], MD    Chief Complaint:  Nausea and vomiting, failure to thrive  HPI: Patient is a 68 year old male with history of diabetes, hypertension, hypothyroidism, ESRD on HD MWF, MRSA bacteremia, left foot trans metatarsal amputation on 4/13 for gangrene left foot by Dr. Sharol Given who was just discharged yesterday on 04/30/13 was brought to the ER by his daughter. History was mostly obtained from the daughter who reported that home health nurse came to the house today and check his vitals. His BP was low 80s, was lethargic and recommended that they take him to the ER. Patient reported persistent nausea, 2 episodes of vomiting and one episode of diarrhea, generalized weakness since discharge. Patient was offered rehabilitation facilities however he refused upon discharge. Patient's daughter reports that she works during the day and her uncle who has been staying with them watches him. But patient has not done much since coming to home and has not been eating well. She strongly feels that he needs rehabilitation to get stronger. He denies having any abdominal pain at this time. He denied any hematochezia or melena.  Review of Systems:  Constitutional: Denies fever, chills, diaphoresis,++ poor appetite and fatigue.  HEENT: Denies photophobia, eye pain, redness, hearing loss, ear pain, congestion, sore throat, rhinorrhea, sneezing, mouth sores, trouble swallowing, neck pain, neck stiffness and tinnitus.   Respiratory: Denies SOB, DOE, cough, chest tightness,  and wheezing.   Cardiovascular: Denies chest pain, palpitations and leg swelling.  Gastrointestinal: Please see history of present illness Genitourinary: Denies dysuria, urgency, frequency,  hematuria, flank pain and difficulty urinating.  Musculoskeletal: Denies myalgias, back pain, joint swelling, arthralgias and gait problem.  Skin: Patient still has sutures from his recent left foot transmetatarsal amputation surgery, no drainage noted Neurological: Denies dizziness, seizures, syncope, light-headedness, numbness and headaches. + generalized weakness Hematological: Denies adenopathy. Easy bruising, personal or family bleeding history  Psychiatric/Behavioral: Denies suicidal ideation, mood changes, confusion, nervousness, sleep disturbance and agitation  Past Medical History: Past Medical History  Diagnosis Date  . Hypertension   . Diabetes mellitus without complication   . Shortness of breath   . Pneumonia     2012  . Heart murmur   . Glaucoma   . Multiple myeloma, without mention of having achieved remission 03/30/2012    Cytogenetic neg on 03/23/2012.  . End stage renal disease on dialysis   . Asthma   . Hyperparathyroidism, secondary renal   . Peripheral arterial disease    Past Surgical History  Procedure Laterality Date  . Thyroidectomy    . Cervical disc surgery    . Eye surgery      CATARACTS  . Insertion of dialysis catheter Right 03/19/2012    Procedure: INSERTION OF DIALYSIS CATHETER;  Surgeon: Mal Misty, MD;  Location: [place];  Service: Vascular;  Laterality: Right;  Right Internal Jugular  . Av fistula placement Left 03/25/2012    Procedure: ARTERIOVENOUS (AV) FISTULA CREATION;  Surgeon: Mal Misty, MD;  Location: [place];  Service: Vascular;  Laterality: Left;  . Ligation of competing branches of arteriovenous fistula Left 05/08/2012    Procedure: LIGATION OF COMPETING BRANCHES OF ARTERIOVENOUS FISTULA;  Surgeon: Mal Misty, MD;  Location: [place];  Service: Vascular;  Laterality: Left;  Ultrasound guided  . Cardiac catheterization  approx 30 years ago  . Amputation Right 11/10/2012    Procedure: AMPUTATION FIRST and SECOND TOES Right Foot;   Surgeon: Elam Dutch, MD;  Location[facility] OR;  Service: Vascular;  Laterality: Right;  . Transmetatarsal amputation Left 12/16/2012    Procedure: TRANSMETATARSAL AMPUTATION AND VAC PLACEMENT;  Surgeon: Elam Dutch, MD;  Location: [place];  Service: Vascular;  Laterality: Left;  . Amputation Left 04/07/2013    Procedure: AMPUTATION DIGIT- LEFT 1ST TOE;  Surgeon: Mal Misty, MD;  Location: [place];  Service: Vascular;  Laterality: Left;  . Tee without cardioversion N/A 04/20/2013    Procedure: TRANSESOPHAGEAL ECHOCARDIOGRAM (TEE);  Surgeon: Josue Hector, MD;  Location: [facility] ENDOSCOPY;  Service: Cardiovascular;  Laterality: N/A;  . I&d extremity Left 04/22/2013    Procedure: QIONGEXBMW UXL DEBRIDEMENT LEFT FIRST TOE AMPUTATION WOUND ;  Surgeon: Mal Misty, MD;  Location: [place];  Service: Vascular;  Laterality: Left;  . Amputation Left 04/26/2013    Procedure: Left Foot Transmetatarsal Amputation;  Surgeon: Newt Minion, MD;  Location: [place];  Service: Orthopedics;  Laterality: Left;  . Toe amputation      D/C 04-30-13    Medications: Prior to Admission medications   Medication Sig Start Date End Date Taking? Authorizing Provider  aspirin EC 81 MG tablet Take 81 mg by mouth daily.   Yes Historical Provider, MD  b complex-vitamin c-folic acid (NEPHRO-VITE) 0.8 MG TABS tablet Take 1 tablet by mouth daily.   Yes Historical Provider, MD  calcium acetate (PHOSLO) 667 MG capsule Take 1 capsule (667 mg total) by mouth 3 (three) times daily with meals. 04/30/13  Yes Adeline Saralyn Pilar, MD  colchicine 0.6 MG tablet Take 0.5 tablets (0.3 mg total) by mouth daily. 04/30/13  Yes Adeline Saralyn Pilar, MD  doxazosin (CARDURA) 2 MG tablet Take 2 mg by mouth at bedtime.   Yes Historical Provider, MD  doxercalciferol (HECTOROL) 4 MCG/2ML injection Inject 0.5 mLs (1 mcg total) into the vein every Monday, Wednesday, and Friday with hemodialysis. 02/27/13  Yes Geradine Girt, DO  levothyroxine (SYNTHROID, LEVOTHROID)  175 MCG tablet Take 175 mcg by mouth daily before breakfast.   Yes Historical Provider, MD  loperamide (IMODIUM) 2 MG capsule Take 1 capsule (2 mg total) by mouth as needed for diarrhea or loose stools. 04/30/13  Yes Adeline Saralyn Pilar, MD  multivitamin (RENA-VIT) TABS tablet Take 1 tablet by mouth at bedtime. 04/30/13  Yes Adeline C Viyuoh, MD  nebivolol (BYSTOLIC) 5 MG tablet Take 5 mg by mouth at bedtime.   Yes Historical Provider, MD  oxyCODONE (OXY IR/ROXICODONE) 5 MG immediate release tablet Take 1 tablet (5 mg total) by mouth every 6 (six) hours as needed for severe pain. 04/30/13  Yes Adeline C Viyuoh, MD  saxagliptin HCl (ONGLYZA) 5 MG TABS tablet Take 5 mg by mouth daily.    Yes Historical Provider, MD  sodium chloride 0.9 % SOLN 100 mL with ferric gluconate 12.5 MG/ML SOLN 125 mg Inject 125 mg into the vein every Monday, Wednesday, and Friday with hemodialysis. 02/27/13  Yes Geradine Girt, DO  vancomycin (VANCOCIN) 1 GM/200ML SOLN Inject 200 mLs (1,000 mg total) into the vein every Monday, Wednesday, and Friday with hemodialysis. 04/30/13  Yes Sheila Oats, MD    Allergies:   Allergies  Allergen Reactions  . Bee Venom Anaphylaxis  . Lisinopril Cough  . Ivp Dye [Iodinated Diagnostic Agents] Hives  . Morphine And Related Other (See  Comments)    Bradycardia states patient    Social History:  reports that he quit smoking about 31 years ago. His smoking use included Cigarettes. He smoked 0.00 packs per day. He has never used smokeless tobacco. He reports that he does not drink alcohol or use illicit drugs.  Family History: Family History  Problem Relation Age of Onset  . Diabetes Mother   . Cancer Mother     bone   . Kidney disease Father     Physical Exam: Blood pressure 113/64, pulse 70, temperature 98.8 F (37.1 C), temperature source Rectal, resp. rate 16, SpO2 100.00%. General: Alert, awake, oriented, in no acute distress. HEENT: normocephalic, atraumatic, anicteric  sclera, pink conjunctiva, pupils equal and reactive to light and accomodation, oropharynx clear Neck: supple, no masses or lymphadenopathy, no goiter, no bruits  Heart: Regular rate and rhythm, without murmurs, rubs or gallops. Lungs: Clear to auscultation bilaterally, no wheezing, rales or rhonchi. Abdomen: Soft, nontender, nondistended, positive bowel sounds, no masses. Extremities: No clubbing, cyanosis or edema with positive pedal pulses. sutures intact for left transmetatarsal amputation, no drainage Neuro: Grossly intact, no focal neurological deficits, strength 5/5 upper and lower extremities bilaterally Psych: alert and oriented, normal mood and affect Skin: no rashes or lesions, warm and dry   LABS on Admission:  Basic Metabolic Panel:  Recent Labs Lab 04/30/13 1000 05/01/13 1921  NA 136* 138  K 4.1 3.8  CL 92* 91*  CO2 23 28  GLUCOSE 148* 170*  BUN 38* 26*  CREATININE 7.94* 5.95*  CALCIUM 10.1 10.3  PHOS 6.3*  --    Liver Function Tests:  Recent Labs Lab 04/30/13 1000 05/01/13 1921  AST  --  12  ALT  --  6  ALKPHOS  --  102  BILITOT  --  0.7  PROT  --  9.0*  ALBUMIN 2.5* 2.6*    Recent Labs Lab 05/01/13 1921  LIPASE 7*   No results found for this basename: AMMONIA,  in the last 168 hours CBC:  Recent Labs Lab 04/30/13 1000 05/01/13 1921  WBC 16.6* 23.9*  NEUTROABS  --  21.1*  HGB 10.2* 11.0*  HCT 32.4* 35.2*  MCV 84.4 86.1  PLT 237 234   Cardiac Enzymes: No results found for this basename: CKTOTAL, CKMB, CKMBINDEX, TROPONINI,  in the last 168 hours BNP: No components found with this basename: POCBNP,  CBG:  Recent Labs Lab 04/30/13 0821 04/30/13 1559  GLUCAP 146* 154*     Radiological Exams on Admission: Dg Chest 2 View  05/01/2013   CLINICAL DATA:  Shortness of breath.  Asthma.  Nausea and vomiting.  EXAM: CHEST  2 VIEW  COMPARISON:  02/23/2013  FINDINGS: Mildly improved aeration of both lungs is noted. Elevation of right  hemidiaphragm again seen. No evidence of pulmonary infiltrate or edema. No evidence of pleural effusion. Heart size and mediastinal contours are stable.  IMPRESSION: Stable elevation of right hemidiaphragm.  No active disease.   Electronically Signed   By: Earle Gell M.D.   On: 05/01/2013 21:34   Mr Foot Left Wo Contrast  04/18/2013   CLINICAL DATA:  Recent great toe amputation. Surgical wound infection.  EXAM: MRI OF THE LEFT FOREFOOT WITHOUT CONTRAST  TECHNIQUE: Multiplanar, multisequence MR imaging was performed. No intravenous contrast was administered.  COMPARISON:  Radiograph dated 03/23/2013  FINDINGS: The great toe has been amputated. There is no bone destruction or edema in the first metatarsal. There is slight edema in the  soft tissues adjacent to the head of the first metatarsal deep to the operative wound.  The other bones of the forefoot are essentially normal.  The adjacent muscles and tendons appear normal. No joint effusions. There is slight sclerosis and irregularity of the sesamoids at the head of the first metatarsal. This is not significant.  IMPRESSION: No evidence of abscess or osteomyelitis. Small amount of fluid adjacent to the head of the first metatarsal is consistent with normal postsurgical seroma.   Electronically Signed   By: Rozetta Nunnery M.D.   On: 04/18/2013 13:24   Mr Ankle Left  Wo Contrast  04/17/2013   CLINICAL DATA:  Fever, pain, swelling, and redness at the site of amputation of the left great toe. Elevated white blood count. Wound infection.  EXAM: MRI OF THE LEFT ANKLE WITHOUT CONTRAST  TECHNIQUE: Multiplanar, multisequence MR imaging of the ankle was performed. No intravenous contrast was administered.  COMPARISON:  Radiographs dated 03/23/2013  FINDINGS: There is circumferential edema in the subcutaneous tissues of the ankle and on the dorsum of the foot, nonspecific.  TENDONS  Peroneal: Normal.  Posteromedial: Normal.  Anterior: Normal.  Achilles: Normal.  Plantar  Fascia: No acute abnormality. Chronic degenerative changes in the origin of the medial band of the plantar fascia.  LIGAMENTS  Lateral: Normal.  Medial: Normal.  .  CARTILAGE  Ankle Joint: Normal.  Subtalar Joints/Sinus Tarsi: Normal.  Bones: Minimal dorsal spurring on the distal talus. Minimal posterior spurring on the distal tibia.  IMPRESSION: No evidence of osteomyelitis or abscess. Edema in the subcutaneous tissues around the ankle and dorsum of the hindfoot, nonspecific.   Electronically Signed   By: Rozetta Nunnery M.D.   On: 04/17/2013 15:23    Assessment/Plan Principal Problem:   Nausea & vomiting, diarrhea, failure to thrive - At this time patient has no vomiting, had only one episode of diarrhea. He did have diarrhea during hospitalization which was thought to be from Nephro-Vite. C. difficile PCR was negative - For now will observe him on telemetry, start on clears and advance as tolerated. He does not have any abdominal pain. Continue antiemetics.  Active Problems: MRSA bacteremia with worsening leukocytosis and lactic acidosis - He is not having any fevers, left foot surgical site looks clean. - Will recheck blood cultures today, blood cultures from 4/4 were positive for MRSA and wound cultures grew abundant MRSA. TEE 04/20/2013 was negative for SBE with ejection fraction of 65%. Patient was followed by ID and recommended total of 8 weeks of IV vancomycin with dialysis. - Due to worsening leukocytosis, lactic acidosis and hypotension, SIRS, will broaden IV antibiotics coverage by adding Zosyn today. ID consult should be obtained in a.m. for further recommendations. May need orthopedics to evaluate his foot again.    Diabetes mellitus type 1, controlled, with complications - Placed on sliding scale insulin    End stage renal disease - Hemodialysis MWF, patient received last dialysis on Friday, next Monday. Please notify renal in a.m.    FTT (failure to thrive) in adult - PTOT consult  in a.m., will likely need a skilled nursing facility  Hypothyroidism: Continue Synthroid  Borderline BP with history of hypertension - Hold Cardura and bystolic today  DVT prophylaxis: Lovenox  CODE STATUS: Full code  Family Communication: Admission, patients condition and plan of care including tests being ordered have been discussed with the patient and daughter who indicates understanding and agree with the plan and Code Status   Further plan will  depend as patient's clinical course evolves and further radiologic and laboratory data become available.   Time Spent on Admission: 1 hour  Ripudeep Krystal Eaton M.D. [facility] 05/01/2013, 10:57 PM Pager: [phone]  If 7PM-7AM, please contact night-coverage [email or web address] Password [email or web address]  **Disclaimer: This note was dictated with voice recognition software. Similar sounding words can inadvertently be transcribed and this note may contain transcription errors which may not have been corrected upon publication of note.**

## 2013-05-01 NOTE — ED Notes (Signed)
PT D/C home on Friday 04-30-13 . Pt has had N/V/D since home.Pt is also a dialysis PT. Last dialysis 04-30-13.

## 2013-05-01 NOTE — ED Notes (Signed)
Dr. Stevie Kern was shown the patients i-STAT CG4+ of 3.33.

## 2013-05-01 NOTE — Progress Notes (Signed)
ANTIBIOTIC CONSULT NOTE - INITIAL  Pharmacy Consult for Vanco/Zosyn Indication: Cellulitis, r/o bacteremia  Allergies  Allergen Reactions  . Bee Venom Anaphylaxis  . Lisinopril Cough  . Ivp Dye [Iodinated Diagnostic Agents] Hives  . Morphine And Related Other (See Comments)    Bradycardia states patient    Patient Measurements:   Adjusted Body Weight:    Vital Signs: Temp: 99.3 F (37.4 C) (04/18 1746) Temp src: Oral (04/18 1746) BP: 104/67 mmHg (04/18 1849) Pulse Rate: 74 (04/18 1849) Intake/Output from previous day:   Intake/Output from this shift:    Labs:  Recent Labs  04/29/13 0510 04/30/13 1000 05/01/13 1921  WBC 15.1* 16.6* 23.9*  HGB 10.8* 10.2* 11.0*  PLT 220 237 234  CREATININE 5.69* 7.94* 5.95*   The CrCl is unknown because both a height and weight (above a minimum accepted value) are required for this calculation. No results found for this basename: VANCOTROUGH, Corlis Leak, VANCORANDOM, GENTTROUGH, GENTPEAK, GENTRANDOM, TOBRATROUGH, TOBRAPEAK, TOBRARND, AMIKACINPEAK, AMIKACINTROU, AMIKACIN,  in the last 72 hours   Microbiology: Recent Results (from the past 720 hour(s))  SURGICAL PCR SCREEN     Status: None   Collection Time    04/17/13  5:10 AM      Result Value Ref Range Status   MRSA, PCR NEGATIVE  NEGATIVE Final   Staphylococcus aureus NEGATIVE  NEGATIVE Final   Comment:            The Xpert SA Assay (FDA     approved for NASAL specimens     in patients over 57 years of age),     is one component of     a comprehensive surveillance     program.  Test performance has     been validated by [employer] for patients greater     than or equal to 60 year old.     It is not intended     to diagnose infection nor to     guide or monitor treatment.  CULTURE, BLOOD (ROUTINE X 2)     Status: None   Collection Time    04/17/13  5:48 AM      Result Value Ref Range Status   Specimen Description BLOOD RIGHT HAND   Final   Special Requests  BOTTLES DRAWN AEROBIC ONLY 5CC   Final   Culture  Setup Time     Final   Value: 04/17/2013 12:44     Performed at [facility]   Culture     Final   Value: METHICILLIN RESISTANT STAPHYLOCOCCUS AUREUS     Note: RIFAMPIN AND GENTAMICIN SHOULD NOT BE USED AS SINGLE DRUGS FOR TREATMENT OF STAPH INFECTIONS. This organism DOES NOT demonstrate inducible Clindamycin resistance in vitro. CRITICAL RESULT CALLED TO, READ BACK BY AND VERIFIED WITH: JAMIE COVINGTON      04/19/13 1437 BY SMITHERSJ     Note: Gram Stain Report Called to,Read Back By and Verified With: Virgilio Frees 04/18/12 @ 10:45AM BY RUSCOE A.     Performed at [facility]   Report Status 04/20/2013 FINAL   Final   Organism ID, Bacteria METHICILLIN RESISTANT STAPHYLOCOCCUS AUREUS   Final  CULTURE, BLOOD (ROUTINE X 2)     Status: None   Collection Time    04/17/13  5:50 AM      Result Value Ref Range Status   Specimen Description BLOOD RIGHT HAND   Final   Special Requests     Final  Value: BOTTLES DRAWN AEROBIC AND ANAEROBIC 10CC BLUE,5CC RED   Culture  Setup Time     Final   Value: 04/17/2013 12:44     Performed at [facility]   Culture     Final   Value: STAPHYLOCOCCUS AUREUS     Note: SUSCEPTIBILITIES PERFORMED ON PREVIOUS CULTURE WITHIN THE LAST 5 DAYS.     Note: Gram Stain Report Called to,Read Back By and Verified With: Earleen Reaper RN on 04/18/13 at 05:50 by Rise Mu     Performed at [facility]   Report Status 04/20/2013 FINAL   Final  CULTURE, BLOOD (ROUTINE X 2)     Status: None   Collection Time    04/19/13  1:04 PM      Result Value Ref Range Status   Specimen Description BLOOD RIGHT HAND   Final   Special Requests BOTTLES DRAWN AEROBIC ONLY [facility]   Final   Culture  Setup Time     Final   Value: 04/19/2013 16:06     Performed at [facility]   Culture     Final   Value: NO GROWTH 5 DAYS     Performed at [facility]   Report Status 04/25/2013 FINAL   Final   CULTURE, BLOOD (ROUTINE X 2)     Status: None   Collection Time    04/19/13  1:10 PM      Result Value Ref Range Status   Specimen Description BLOOD RIGHT ANTECUBITAL   Final   Special Requests BOTTLES DRAWN AEROBIC ONLY 5CC   Final   Culture  Setup Time     Final   Value: 04/19/2013 16:06     Performed at [facility]   Culture     Final   Value: NO GROWTH 5 DAYS     Performed at [facility]   Report Status 04/25/2013 FINAL   Final  WOUND CULTURE     Status: None   Collection Time    04/22/13 10:37 AM      Result Value Ref Range Status   Specimen Description WOUND LEFT FOOT   Final   Special Requests LEFT FIRST TOE PT ON VANCO VANCO   Final   Gram Stain     Final   Value: FEW WBC PRESENT, PREDOMINANTLY PMN     FEW SQUAMOUS EPITHELIAL CELLS PRESENT     MODERATE GRAM POSITIVE COCCI IN CLUSTERS     MODERATE GRAM POSITIVE RODS     Performed at [facility]   Culture     Final   Value: ABUNDANT METHICILLIN RESISTANT STAPHYLOCOCCUS AUREUS     Note: RIFAMPIN AND GENTAMICIN SHOULD NOT BE USED AS SINGLE DRUGS FOR TREATMENT OF STAPH INFECTIONS. This organism DOES NOT demonstrate inducible Clindamycin resistance in vitro. CRITICAL RESULT CALLED TO, READ BACK BY AND VERIFIED WITH: ALBERT R @ 7:40AM      04/25/13 BY DWEEKS     Performed at [facility]   Report Status 04/25/2013 FINAL   Final   Organism ID, Bacteria METHICILLIN RESISTANT STAPHYLOCOCCUS AUREUS   Final  CLOSTRIDIUM DIFFICILE BY PCR     Status: None   Collection Time    04/24/13 12:25 AM      Result Value Ref Range Status   C difficile by pcr NEGATIVE  NEGATIVE Final    Medical History: Past Medical History  Diagnosis Date  . Hypertension   . Diabetes mellitus without complication   .  Shortness of breath   . Pneumonia     2012  . Heart murmur   . Glaucoma   . Multiple myeloma, without mention of having achieved remission 03/30/2012    Cytogenetic neg on 03/23/2012.  . End stage  renal disease on dialysis   . Asthma   . Hyperparathyroidism, secondary renal   . Peripheral arterial disease     Medications: pending  Assessment: Nausea, Emesis, diarrhea 68 y/o M was just discharged 24 hrs ago now returns with gangrenous foot s/p toe amputation on the left side, with continued nausea, vomiting, diarrhea, weakness, inability to ambulate, "coldness." Patient is ESRD with last HD 4/17. MD does note erythema around his left foot postop wound. LA 3.33. WBC 23.9. Patient on Vancomycin MWF with HD with last dose 4/17. 4/10: Vanco level 17.1 4/15: Vanco level 15.4.  Goal of Therapy:  Vanco level <20 after HD for redosing  Plan:  Continue Vancomycin 1g IV MWF with HD Zosyn 2.25g IV q8hr.   Romario Tith S. Alford Highland, PharmD, [facility] Clinical Staff Pharmacist Pager [phone]  Coralyn Pear Alford Highland 05/01/2013,9:06 PM

## 2013-05-01 NOTE — ED Notes (Signed)
CBG Taken = 168

## 2013-05-01 NOTE — ED Notes (Signed)
Patient states he is still unable to give a urine sample.

## 2013-05-02 LAB — BASIC METABOLIC PANEL
BUN: 29 mg/dL — AB (ref 6–23)
CALCIUM: 10.2 mg/dL (ref 8.4–10.5)
CO2: 25 mEq/L (ref 19–32)
Chloride: 95 mEq/L — ABNORMAL LOW (ref 96–112)
Creatinine, Ser: 6.35 mg/dL — ABNORMAL HIGH (ref 0.50–1.35)
GFR calc non Af Amer: 8 mL/min — ABNORMAL LOW (ref >90)
GFR, EST AFRICAN AMERICAN: 9 mL/min — AB (ref >90)
Glucose, Bld: 147 mg/dL — ABNORMAL HIGH (ref 70–99)
POTASSIUM: 3.8 meq/L (ref 3.7–5.3)
Sodium: 140 mEq/L (ref 137–147)

## 2013-05-02 LAB — CBC
HCT: 32.8 % — ABNORMAL LOW (ref 39.0–52.0)
HEMOGLOBIN: 10 g/dL — AB (ref 13.0–17.0)
MCH: 26.4 pg (ref 26.0–34.0)
MCHC: 30.5 g/dL (ref 30.0–36.0)
MCV: 86.5 fL (ref 78.0–100.0)
PLATELETS: 251 10*3/uL (ref 150–400)
RBC: 3.79 MIL/uL — ABNORMAL LOW (ref 4.22–5.81)
RDW: 15.1 % (ref 11.5–15.5)
WBC: 22.1 10*3/uL — ABNORMAL HIGH (ref 4.0–10.5)

## 2013-05-02 LAB — GLUCOSE, CAPILLARY
GLUCOSE-CAPILLARY: 110 mg/dL — AB (ref 70–99)
Glucose-Capillary: 160 mg/dL — ABNORMAL HIGH (ref 70–99)
Glucose-Capillary: 107 mg/dL — ABNORMAL HIGH (ref 70–99)

## 2013-05-02 LAB — HEMOGLOBIN A1C
Hgb A1c MFr Bld: 6.5 % — ABNORMAL HIGH (ref <5.7)
Mean Plasma Glucose: 140 mg/dL — ABNORMAL HIGH (ref <117)

## 2013-05-02 LAB — MRSA PCR SCREENING: MRSA BY PCR: NEGATIVE

## 2013-05-02 MED ORDER — SODIUM CHLORIDE 0.9 % IV SOLN
125.0000 mg | INTRAVENOUS | Status: DC
  Filled 2013-05-02: qty 10

## 2013-05-02 MED ORDER — ONDANSETRON HCL 4 MG/2ML IJ SOLN
4.0000 mg | Freq: Four times a day (QID) | INTRAMUSCULAR | Status: DC | PRN
  Administered 2013-05-02: 4 mg via INTRAVENOUS
  Filled 2013-05-02: qty 2

## 2013-05-02 MED ORDER — OXYCODONE HCL 5 MG PO TABS
5.0000 mg | ORAL_TABLET | ORAL | Status: DC | PRN
  Administered 2013-05-02 – 2013-05-03 (×3): 5 mg via ORAL
  Administered 2013-05-05 – 2013-05-07 (×6): 10 mg via ORAL
  Filled 2013-05-02 (×3): qty 2
  Filled 2013-05-02: qty 1
  Filled 2013-05-02: qty 2
  Filled 2013-05-02: qty 1
  Filled 2013-05-02: qty 2
  Filled 2013-05-02: qty 1

## 2013-05-02 MED ORDER — RENA-VITE PO TABS
1.0000 | ORAL_TABLET | Freq: Every day | ORAL | Status: DC
  Administered 2013-05-02 – 2013-05-06 (×6): 1 via ORAL
  Filled 2013-05-02 (×9): qty 1

## 2013-05-02 MED ORDER — DOXERCALCIFEROL 4 MCG/2ML IV SOLN
1.0000 ug | INTRAVENOUS | Status: DC
  Filled 2013-05-02: qty 2

## 2013-05-02 MED ORDER — COLCHICINE 0.6 MG PO TABS
0.3000 mg | ORAL_TABLET | Freq: Every day | ORAL | Status: DC
  Administered 2013-05-02: 0.3 mg via ORAL
  Filled 2013-05-02: qty 0.5

## 2013-05-02 MED ORDER — CALCIUM ACETATE 667 MG PO CAPS
667.0000 mg | ORAL_CAPSULE | Freq: Three times a day (TID) | ORAL | Status: DC
  Administered 2013-05-02 – 2013-05-03 (×2): 667 mg via ORAL
  Filled 2013-05-02 (×7): qty 1

## 2013-05-02 MED ORDER — ASPIRIN EC 81 MG PO TBEC
81.0000 mg | DELAYED_RELEASE_TABLET | Freq: Every day | ORAL | Status: DC
  Administered 2013-05-02 – 2013-05-07 (×6): 81 mg via ORAL
  Filled 2013-05-02 (×6): qty 1

## 2013-05-02 MED ORDER — ACETAMINOPHEN 650 MG RE SUPP: 650.0000 mg | Freq: Four times a day (QID) | RECTAL | Status: DC | PRN

## 2013-05-02 MED ORDER — OXYCODONE HCL 5 MG PO TABS: 5.0000 mg | ORAL_TABLET | ORAL | Status: DC | PRN

## 2013-05-02 MED ORDER — RENA-VITE PO TABS: 1.0000 | ORAL_TABLET | Freq: Every day | ORAL | Status: DC

## 2013-05-02 MED ORDER — ONDANSETRON HCL 4 MG PO TABS: 4.0000 mg | ORAL_TABLET | Freq: Four times a day (QID) | ORAL | Status: DC | PRN

## 2013-05-02 MED ORDER — ENOXAPARIN SODIUM 30 MG/0.3ML ~~LOC~~ SOLN
30.0000 mg | Freq: Every day | SUBCUTANEOUS | Status: DC
  Administered 2013-05-02 – 2013-05-07 (×6): 30 mg via SUBCUTANEOUS
  Filled 2013-05-02 (×6): qty 0.3

## 2013-05-02 MED ORDER — SODIUM CHLORIDE 0.9 % IV BOLUS (SEPSIS)
250.0000 mL | Freq: Once | INTRAVENOUS | Status: AC
  Administered 2013-05-02: 250 mL via INTRAVENOUS

## 2013-05-02 MED ORDER — LEVOTHYROXINE SODIUM 175 MCG PO TABS
175.0000 ug | ORAL_TABLET | Freq: Every day | ORAL | Status: DC
  Administered 2013-05-02 – 2013-05-07 (×6): 175 ug via ORAL
  Filled 2013-05-02 (×8): qty 1

## 2013-05-02 MED ORDER — LOPERAMIDE HCL 2 MG PO CAPS
2.0000 mg | ORAL_CAPSULE | ORAL | Status: DC | PRN
  Filled 2013-05-02: qty 1

## 2013-05-02 MED ORDER — INSULIN ASPART 100 UNIT/ML ~~LOC~~ SOLN
0.0000 [IU] | Freq: Three times a day (TID) | SUBCUTANEOUS | Status: DC
  Administered 2013-05-02 – 2013-05-03 (×3): 2 [IU] via SUBCUTANEOUS
  Administered 2013-05-04: 1 [IU] via SUBCUTANEOUS
  Administered 2013-05-05: 3 [IU] via SUBCUTANEOUS
  Administered 2013-05-05 – 2013-05-07 (×3): 1 [IU] via SUBCUTANEOUS

## 2013-05-02 MED ORDER — ACETAMINOPHEN 325 MG PO TABS: 650.0000 mg | ORAL_TABLET | Freq: Four times a day (QID) | ORAL | Status: DC | PRN

## 2013-05-02 NOTE — Evaluation (Signed)
Physical Therapy Evaluation Patient Details Name: KAYZEN KENDZIERSKI MRN: [id number] DOB: 1945-10-08 Today's Date: 05/02/2013   History of Present Illness  Pt underwent Lt transmetatarsal amputation 04/26/13 due to extensive  gangrene of Lt. foot Pt is to be strict NWB of left LE; Pt went home after last admission despite PT recs for post-acute rehab, and now returns with N&V, hypovolemia, lethargy  Clinical Impression   Pt admitted with above. Pt currently with functional limitations due to the deficits listed below (see PT Problem List).  Pt will benefit from skilled PT to increase their independence and safety with mobility to allow discharge to the venue listed below.   Anticipate once N&V is resolved, and pt euvolemic without hypotension, he will be better able to participate in therapies, and can pursue postacute rehab at CIR to maximize independence and safety with mobility prior to dc'ing home; he has plenty of assist available to him at home.      Follow Up Recommendations CIR    Equipment Recommendations  None recommended by PT    Recommendations for Other Services OT consult;Rehab consult     Precautions / Restrictions Precautions Precautions: Fall Other Brace/Splint: Pt's Darco show not in the room Restrictions LLE Weight Bearing: Non weight bearing Other Position/Activity Restrictions: strict NWB LLE      Mobility  Bed Mobility Overal bed mobility: Needs Assistance Bed Mobility: Rolling;Sidelying to Sit;Sit to Supine Rolling: Min guard Sidelying to sit: Mod assist   Sit to supine: Mod assist   General bed mobility comments: cues for use of rail to roll and assist to elevate trunk from surface; decr initiation despite agreeing to get up; required tactile cueing to initiate; Mod assist to acheive upright sitting, and to assist LEs back into bed  Transfers                 General transfer comment: Did not attempt transfers, as pt reported dizziness sitting  up and requested to lay back down  Ambulation/Gait                Stairs            Wheelchair Mobility    Modified Rankin (Stroke Patients Only)       Balance Overall balance assessment: Needs assistance Sitting-balance support: Bilateral upper extremity supported Sitting balance-Leahy Scale: Fair Sitting balance - Comments: pt keeps head down and trunk flexed                                     Pertinent Vitals/Pain See vitals flow sheet. (at around 1700)    [place] expects to be discharged to:: Private residence Living Arrangements: Children Available Help at Discharge: Family;Available 24 hours/day Type of Home: Apartment Home Access: Level entry     Home Layout: One level Home Equipment: Walker - 2 wheels;Cane - single point;Shower seat;Wheelchair - manual Additional Comments: having difficulty transferring from low toilet seat recently     Prior Function Level of Independence: Independent with assistive device(s) (prior to most recent surgery)         Comments: As of 4/10, pt was ambulating with RW unless pain was too bad; primarily in wheelchair recently due to pain; could transfer to wheelchair independently      Hand Dominance        Extremity/Trunk Assessment   Upper Extremity Assessment: Defer to OT evaluation  Lower Extremity Assessment: Generalized weakness (noted some drainage on LL/foot dressing) RLE Deficits / Details: well healed transmet amputation       Communication   Communication: Other (comment) (Ntoed pt agrees with what is said, but does not initiate)  Cognition Arousal/Alertness: Lethargic Behavior During Therapy: Flat affect Overall Cognitive Status: Impaired/Different from baseline Area of Impairment: Problem solving     Memory: Decreased short-term memory;Decreased recall of precautions   Safety/Judgement: Decreased awareness of deficits;Decreased awareness of  safety   Problem Solving: Requires verbal cues;Slow processing General Comments: Requries verbal and tactile cueing to initiate tasks    General Comments      Exercises        Assessment/Plan    PT Assessment Patient needs continued PT services  PT Diagnosis Difficulty walking;Generalized weakness   PT Problem List Decreased strength;Decreased range of motion;Decreased activity tolerance;Decreased balance;Decreased mobility;Decreased coordination;Decreased cognition;Decreased knowledge of use of DME;Decreased safety awareness;Pain  PT Treatment Interventions DME instruction;Gait training;Functional mobility training;Therapeutic exercise;Therapeutic activities;Balance training;Patient/family education   PT Goals (Current goals can be found in the Care Plan section) Acute Rehab PT Goals Patient Stated Goal: did not state PT Goal Formulation: Patient unable to participate in goal setting Time For Goal Achievement: 05/16/13 Potential to Achieve Goals: Fair    Frequency Min 3X/week   Barriers to discharge        Co-evaluation               End of Session   Activity Tolerance: Patient limited by lethargy;Other (comment) (also limited by hypotension) Patient left: in bed;with call bell/phone within reach;with family/visitor present Nurse Communication: Mobility status;Other (comment) (BP low sitting up and at end of PT session)         Time: 7829-5621 PT Time Calculation (min): 21 min   Charges:   PT Evaluation $Initial PT Evaluation Tier I: 1 Procedure PT Treatments $Therapeutic Activity: 8-22 mins   PT G Codes:          [facility] [place] 05/02/2013, 8:31 PM  Roney Marion, [place] Pager [phone] Office [phone]

## 2013-05-02 NOTE — Progress Notes (Signed)
[place] TEAM 1 - Stepdown/ICU TEAM Progress Note  Jack [name] NFA:[id number] DOB: 28-Apr-1945 DOA: 05/01/2013 PCP: [name], MD  Admit HPI / Brief Narrative: 68 year old male with history of diabetes, hypertension, hypothyroidism, ESRD on HD MWF, MRSA bacteremia, left foot trans metatarsal amputation on 4/13 for gangrene left foot by Dr. Sharol Given who was just discharged on 04/30/13 and was brought back to the ER by his daughter who reported that home health nurse came to the house and checked his vitals. His BP was low 80s, he was lethargic, and the RN then recommended that they take him to the ER. Patient reported persistent nausea, 2 episodes of vomiting and one episode of diarrhea, with generalized weakness since discharge. During his prior admit the patient was offered SNF placement for rehab, however he refused. Patient's daughter reported that she works during the day and her uncle who has been staying with them watches him. But patient has not done much since going home and has not been eating well. She strongly feels that he needs rehabilitation to get stronger.   Recent sequence of events: 3/25 - L first toe amputation per Dr. Kellie Simmering 4/3 - admit for post-op wound infection w/ MRSA bacteremia (wound and blood cx +) - plan for 8 weeks IV vanc per ID  4/7 - TEE - no evidence of vegetation - ejection fraction of 65% 4/9 - I&D left first toe amputation wound  4/13 - L TMA per Dr. Sharol Given 4/17 - d/c home after pt refuses SNF recommendation  4/18 - returned to ER   HPI/Subjective: Pt says "I feel rough."  Having diarrhea this morning, with ongoing nausea.  Denies cp or sob.    Assessment/Plan:  Hypotension Likely due primarily to hypovolemia due to increased loss (diarrhea) with diminished intake - very gently hydrate and follow   N/V - Diarrhea C diff negative during prior admit - recheck - stop zosyn   Failure to thrive  Pt was advised that he needed SNF as it was not felt he  would be safe at home - he refused and was therefore d/c home - he did not do well at home and returned to the ER ~24hrs later - SNF placement will be our recommendation again, as it was during his prior admission   MRSA bacteremia Cont Vanc - no indication of other acute infections presently, therefore will stop Zosyn and follow on Vanc alone  Diabetic toe gangrene / Wound infection after surgery / s/p TMA  Cont Vanc- wound inspected last night with no evidence of acute complications - will check wound QOD - f/u w/ Dr. Sharol Given as previously prescribed unless acute complication encountered   Diabetes mellitus type 1 with renal and vascular complications  CBG reasonably controlled at present - follow trend   Anemia in chronic kidney disease  Hgb appears to be stable  End stage renal disease on HD (M/W/F) Nephrology notified via VM of need for inpt HD 4/20 - no acute indication for HD today   Hypothyroid  Cont home synthroid dose  Peripheral vascular disease As above  Hx of multiple myeloma   Code Status: FULL Family Communication: no family present at time of exam Disposition Plan: SDU for today - probable transfer to renal floor in AM if BP stable   Consultants: Nephrology  Procedures: none  Antibiotics: Zosyn 4/18 >> Vanc (at admit) >>  DVT prophylaxis: lovenox  Objective: Blood pressure 101/63, pulse 69, temperature 97.3 F (36.3 C), temperature source Oral,  resp. rate 21, height _0  (1.778 m), weight 72.4 kg (159 lb 9.8 oz), SpO2 100.00%.  Intake/Output Summary (Last 24 hours) at 05/02/13 1100 Last data filed at 05/02/13 0045  Gross per 24 hour  Intake    550 ml  Output      0 ml  Net    550 ml   Exam: General: No acute respiratory distress Lungs: Clear to auscultation bilaterally without wheezes or crackles Cardiovascular: Regular rate and rhythm without murmur gallop or rub  Abdomen: Nontender, nondistended, soft, bowel sounds positive, no rebound, no  ascites, no appreciable mass Extremities: No significant cyanosis, clubbing, or edema bilateral lower extremities - L foot wound dressed and dry   Data Reviewed: Basic Metabolic Panel:  Recent Labs Lab 04/26/13 1233 04/27/13 0620 04/28/13 0902 04/29/13 0510 04/30/13 1000 05/01/13 1921 05/02/13 0454  NA 138 136* 135* 139 136* 138 140  K 3.8 4.1 4.1 4.2 4.1 3.8 3.8  CL 93* 92* 91* 95* 92* 91* 95*  CO2 _1 GLUCOSE 85 99 125* 132* 148* 170* 147*  BUN 43* 28* 40* 21 38* 26* 29*  CREATININE 9.05* 6.27* 8.02* 5.69* 7.94* 5.95* 6.35*  CALCIUM 9.7 9.3 9.5 9.7 10.1 10.3 10.2  PHOS 6.3* 5.6* 6.3* 5.1* 6.3*  --   --    Liver Function Tests:  Recent Labs Lab 04/27/13 0620 04/28/13 0902 04/29/13 0510 04/30/13 1000 05/01/13 1921  AST  --   --   --   --  12  ALT  --   --   --   --  6  ALKPHOS  --   --   --   --  102  BILITOT  --   --   --   --  0.7  PROT  --   --   --   --  9.0*  ALBUMIN 2.4* 2.4* 2.6* 2.5* 2.6*    Recent Labs Lab 05/01/13 1921  LIPASE 7*   CBC:  Recent Labs Lab 04/28/13 0902 04/29/13 0510 04/30/13 1000 05/01/13 1921 05/02/13 0454  WBC 12.5* 15.1* 16.6* 23.9* 22.1*  NEUTROABS  --   --   --  21.1*  --   HGB 10.2* 10.8* 10.2* 11.0* 10.0*  HCT 32.3* 34.5* 32.4* 35.2* 32.8*  MCV 83.7 85.2 84.4 86.1 86.5  PLT 261 220 237 234 251   CBG:  Recent Labs Lab 04/29/13 1747 04/29/13 2125 04/30/13 0821 04/30/13 1559 05/01/13 2350  GLUCAP 113* 150* 146* 154* 165*    Recent Results (from the past 240 hour(s))  CLOSTRIDIUM DIFFICILE BY PCR     Status: None   Collection Time    04/24/13 12:25 AM      Result Value Ref Range Status   C difficile by pcr NEGATIVE  NEGATIVE Final  MRSA PCR SCREENING     Status: None   Collection Time    05/02/13  1:08 AM      Result Value Ref Range Status   MRSA by PCR NEGATIVE  NEGATIVE Final   Comment:            The GeneXpert MRSA Assay (FDA     approved for NASAL specimens     only), is one  component of a     comprehensive MRSA colonization     surveillance program. It is not     intended to diagnose MRSA     infection nor to guide or     monitor  treatment for     MRSA infections.     Studies:  Recent x-ray studies have been reviewed in detail by the Attending Physician  Scheduled Meds:  Scheduled Meds: . aspirin EC  81 mg Oral Daily  . calcium acetate  667 mg Oral TID WC  . colchicine  0.3 mg Oral Daily  . enoxaparin (LOVENOX) injection  30 mg Subcutaneous Daily  . levothyroxine  175 mcg Oral QAC breakfast  . multivitamin  1 tablet Oral QHS  . [START ON 05/03/2013] vancomycin  1,000 mg Intravenous Q M,W,F-HD    Time spent on care of this patient: 35 mins   Cherene Altes , MD   [facility] Office  [phone] Pager - Text Page per Amion as per below:  On-Call/Text Page:      [email or web address]      password [email or web address]  If 7PM-7AM, please contact night-coverage [email or web address] Password [email or web address] 05/02/2013, 11:00 AM   LOS: 1 day

## 2013-05-03 LAB — COMPREHENSIVE METABOLIC PANEL
ALT: <5 U/L (ref 0–53)
AST: 9 U/L (ref 0–37)
Albumin: 2.2 g/dL — ABNORMAL LOW (ref 3.5–5.2)
Alkaline Phosphatase: 89 U/L (ref 39–117)
BUN: 40 mg/dL — ABNORMAL HIGH (ref 6–23)
CALCIUM: 9.8 mg/dL (ref 8.4–10.5)
CHLORIDE: 95 meq/L — AB (ref 96–112)
CO2: 24 mEq/L (ref 19–32)
Creatinine, Ser: 8.46 mg/dL — ABNORMAL HIGH (ref 0.50–1.35)
GFR, EST AFRICAN AMERICAN: 7 mL/min — AB (ref >90)
GFR, EST NON AFRICAN AMERICAN: 6 mL/min — AB (ref >90)
GLUCOSE: 123 mg/dL — AB (ref 70–99)
Potassium: 4.1 mEq/L (ref 3.7–5.3)
SODIUM: 142 meq/L (ref 137–147)
Total Bilirubin: 0.8 mg/dL (ref 0.3–1.2)
Total Protein: 8 g/dL (ref 6.0–8.3)

## 2013-05-03 LAB — GLUCOSE, CAPILLARY
GLUCOSE-CAPILLARY: 116 mg/dL — AB (ref 70–99)
GLUCOSE-CAPILLARY: 226 mg/dL — AB (ref 70–99)
GLUCOSE-CAPILLARY: 170 mg/dL — AB (ref 70–99)
GLUCOSE-CAPILLARY: 72 mg/dL (ref 70–99)
Glucose-Capillary: 106 mg/dL — ABNORMAL HIGH (ref 70–99)
Glucose-Capillary: 112 mg/dL — ABNORMAL HIGH (ref 70–99)
Glucose-Capillary: 187 mg/dL — ABNORMAL HIGH (ref 70–99)

## 2013-05-03 LAB — CBC
HCT: 31.2 % — ABNORMAL LOW (ref 39.0–52.0)
HEMOGLOBIN: 9.5 g/dL — AB (ref 13.0–17.0)
MCH: 26.3 pg (ref 26.0–34.0)
MCHC: 30.4 g/dL (ref 30.0–36.0)
MCV: 86.4 fL (ref 78.0–100.0)
PLATELETS: 238 10*3/uL (ref 150–400)
RBC: 3.61 MIL/uL — ABNORMAL LOW (ref 4.22–5.81)
RDW: 15 % (ref 11.5–15.5)
WBC: 20.4 10*3/uL — ABNORMAL HIGH (ref 4.0–10.5)

## 2013-05-03 MED ORDER — DARBEPOETIN ALFA-POLYSORBATE 100 MCG/0.5ML IJ SOLN
100.0000 ug | INTRAMUSCULAR | Status: DC
Start: 2013-05-04 — End: 2013-05-07
  Filled 2013-05-03: qty 0.5

## 2013-05-03 MED ORDER — VANCOMYCIN HCL IN DEXTROSE 1-5 GM/200ML-% IV SOLN
1000.0000 mg | INTRAVENOUS | Status: DC
  Administered 2013-05-04 – 2013-05-06 (×2): 1000 mg via INTRAVENOUS
  Filled 2013-05-03 (×5): qty 200

## 2013-05-03 MED ORDER — BOOST / RESOURCE BREEZE PO LIQD
1.0000 | Freq: Three times a day (TID) | ORAL | Status: DC
  Administered 2013-05-03 – 2013-05-05 (×3): 1 via ORAL

## 2013-05-03 MED ORDER — LANTHANUM CARBONATE 500 MG PO CHEW
1000.0000 mg | CHEWABLE_TABLET | Freq: Three times a day (TID) | ORAL | Status: DC
  Administered 2013-05-03 – 2013-05-05 (×5): 1000 mg via ORAL
  Filled 2013-05-03 (×14): qty 2

## 2013-05-03 MED ORDER — COLLAGENASE 250 UNIT/GM EX OINT
TOPICAL_OINTMENT | Freq: Every day | CUTANEOUS | Status: DC
  Administered 2013-05-03 – 2013-05-06 (×4): via TOPICAL
  Administered 2013-05-07: 1 via TOPICAL
  Filled 2013-05-03: qty 30

## 2013-05-03 MED ORDER — SODIUM CHLORIDE 0.9 % IV SOLN
125.0000 mg | INTRAVENOUS | Status: DC
  Administered 2013-05-04 – 2013-05-06 (×2): 125 mg via INTRAVENOUS
  Filled 2013-05-03 (×5): qty 10

## 2013-05-03 NOTE — Progress Notes (Signed)
[facility] Progress Note  Subjective:   Admitted 4/3-4/17 for MRSA bacteremia and wound infection s/p left transmet amp. Readmitted on 4/18 with hypotension and ntractable nausea and vomiting. He reports no emesis since this morning. Denies pain.  Doesn't feel up to HD today.  Objective Filed Vitals:   05/03/13 0500 05/03/13 0600 05/03/13 0741 05/03/13 1133  BP: 143/80 107/63 108/57 92/40  Pulse: 73 69 71 80  Temp:   98.5 F (36.9 C) 98.6 F (37 C)  TempSrc:   Oral Oral  Resp: _0 Height:      Weight:      SpO2: 92% 96% 95% 99%   Physical Exam General: Ill-appearing, alert, cooperative, NAD Heart: RRR, 2/6 murmur Lungs: CTA bilat, no wheezes/rhonchi Abdomen: soft, NT, + BS Extremities: Lt transmet amp with intact sutures, trace blood, ++erythema. Rt Transmet amp well healed/no ulcerations. Dry skin, No pretibial edema Dialysis Access: L AVF + bruit  Dialysis: [facility]WF [facility]  4h 400/800 85.5kg (Probably closer to 73 kg per wgts last admit) 4Heparin 3500 LUA AVF      Assessment/Plan: 1. Hypotension - Mgmt per primary. Gentle IVF 2. N/V/D - Mgmt per primary. C diff negative last admit. Repeat pending 3. Wound infection left foot / MRSA bacteremia - s/p L great toe amputation 3/25, irrigation & debridement 4/9 per Dr. Kellie Simmering, TEE 4/7 showed moderate LVH, EF 65%, no vegetation; L transmet amputation per Dr. Sharol Given 4/15. On IV Vancomycin through 05/28/13 per ID recs last admit. 4. ESRD - Will be TTS this admit. K+ 4.1.  5. HTN/Volume - Soft SBPs. Bystolic held. Doxazosin dc'd last admit; Post wgt on 4/17was 72.3 kg. HD tomorrow with UF 2L as tolerated. Needs lower edw at d/c  6. Anemia - Hgb down to 9.5. No op ESAs or Fe. None last admit. Add Aranesp 100. Follow labs.  7. Sec HPT - Ca 9.8 (11.2 corrected), Last P 6.3. Hectorol 1 held last admit. Use 2Ca bath and change Phoslo to Fosrenol.  8. Nutrition - Alb 2.2. On clears, multivitamin, supplement.  9. DM -  insulin per primary. 10. Hx Multiple myeloma - Next f/u with Onc is in June. 11. Dispo - Refused SNF last admit.  Per primary, will recommend again due to FTT and safety issues. Insurance will not cover CIR with transmet amputation per notes.  Collene [name]. Jack [name] [facility] Pager [phone] 05/03/2013,2:09 PM  LOS: 2 days   Pt seen, examined and agree w A/P as above.  [name] MD pager [phone]    cell [phone] 05/03/2013, 5:44 PM    Additional Objective Labs: Basic Metabolic Panel:  Recent Labs Lab 04/28/13 0902 04/29/13 0510 04/30/13 1000 05/01/13 1921 05/02/13 0454 05/03/13 0334  NA 135* 139 136* 138 140 142  K 4.1 4.2 4.1 3.8 3.8 4.1  CL 91* 95* 92* 91* 95* 95*  CO2 _1 GLUCOSE 125* 132* 148* 170* 147* 123*  BUN 40* 21 38* 26* 29* 40*  CREATININE 8.02* 5.69* 7.94* 5.95* 6.35* 8.46*  CALCIUM 9.5 9.7 10.1 10.3 10.2 9.8  PHOS 6.3* 5.1* 6.3*  --   --   --    Liver Function Tests:  Recent Labs Lab 04/30/13 1000 05/01/13 1921 05/03/13 0334  AST  --  12 9  ALT  --  6 <5  ALKPHOS  --  102 89  BILITOT  --  0.7 0.8  PROT  --  9.0* 8.0  ALBUMIN 2.5* 2.6* 2.2*    Recent Labs Lab 05/01/13 1921  LIPASE 7*   CBC:  Recent Labs Lab 04/29/13 0510 04/30/13 1000 05/01/13 1921 05/02/13 0454 05/03/13 0334  WBC 15.1* 16.6* 23.9* 22.1* 20.4*  NEUTROABS  --   --  21.1*  --   --   HGB 10.8* 10.2* 11.0* 10.0* 9.5*  HCT 34.5* 32.4* 35.2* 32.8* 31.2*  MCV 85.2 84.4 86.1 86.5 86.4  PLT 220 237 234 251 238   Blood Culture    Component Value Date/Time   SDES BLOOD RIGHT HAND 05/01/2013 2100   SPECREQUEST BOTTLES DRAWN AEROBIC ONLY [place] [facility] 05/01/2013 2100   CULT  Value:        BLOOD CULTURE RECEIVED NO GROWTH TO DATE CULTURE WILL BE HELD FOR 5 DAYS BEFORE ISSUING A FINAL NEGATIVE REPORT Performed at [facility] 05/01/2013 2100   REPTSTATUS PENDING 05/01/2013 2100    CBG:  Recent Labs Lab 05/02/13 1934 05/03/13 0037  05/03/13 0423 05/03/13 0749 05/03/13 1137  GLUCAP 110* 106* 112* 116* 226*    Studies/Results: Dg Chest 2 View  05/01/2013   CLINICAL DATA:  Shortness of breath.  Asthma.  Nausea and vomiting.  EXAM: CHEST  2 VIEW  COMPARISON:  02/23/2013  FINDINGS: Mildly improved aeration of both lungs is noted. Elevation of right hemidiaphragm again seen. No evidence of pulmonary infiltrate or edema. No evidence of pleural effusion. Heart size and mediastinal contours are stable.  IMPRESSION: Stable elevation of right hemidiaphragm.  No active disease.   Electronically Signed   By: Earle Gell M.D.   On: 05/01/2013 21:34   Medications:   . aspirin EC  81 mg Oral Daily  . collagenase   Topical Daily  . [START ON 05/04/2013] darbepoetin (ARANESP) injection - DIALYSIS  100 mcg Intravenous Q Tue-HD  . doxercalciferol  1 mcg Intravenous Q M,W,F-HD  . enoxaparin (LOVENOX) injection  30 mg Subcutaneous Daily  . ferric gluconate (FERRLECIT/NULECIT) IV  125 mg Intravenous Q M,W,F-HD  . insulin aspart  0-9 Units Subcutaneous TID WC  . lanthanum  1,000 mg Oral TID WC  . levothyroxine  175 mcg Oral QAC breakfast  . multivitamin  1 tablet Oral QHS  . [START ON 05/04/2013] vancomycin  1,000 mg Intravenous Q T,Th,Sa-HD

## 2013-05-03 NOTE — Progress Notes (Signed)
Rehab Admissions Coordinator Note:  Patient was screened by [name] for appropriateness for an Inpatient Acute Rehab Consult. Per PT recommendation. At this time, we are recommending Jack [name]. [employer] onsite representative states insurance will not approve an inpt rehab admission for a transmetatarsal amputation. Please call me with any questions.   Audelia Acton [facility] 05/03/2013, 10:29 AM  I can be reached at [phone].

## 2013-05-03 NOTE — Consult Note (Addendum)
WOC wound follow up Refer to previous [place] progress note from 4/14.  Santyl was ordered at that time for unstageable wound to buttock and order was somehow discontinued. Re-ordered for chemical debridement of nonviable tissue. Dr Sharol Given following for assessment and plan of care to lower extremity wound. Julien Girt MSN, RN, [place], [place], [place]

## 2013-05-03 NOTE — Progress Notes (Signed)
INITIAL NUTRITION ASSESSMENT  DOCUMENTATION CODES Per approved criteria  -Severe malnutrition in the context of chronic illness   INTERVENTION: Resource Breeze po TID, each supplement provides 250 kcal and 9 grams of protein Continue RENA-VIT daily RD to follow for nutrition care plan  NUTRITION DIAGNOSIS: Inadequate oral intake related to poor appetite as evidenced by daughter report  Goal: Pt to meet >/= 90% of their estimated nutrition needs   Monitor:  PO & supplemental intake, weight, labs, I/O's  Reason for Assessment: Malnutrition Screening Tool Report  68 y.o. male  Admitting Dx: Nausea & vomiting  ASSESSMENT: 68 year old male with history of diabetes, hypertension, hypothyroidism, ESRD on HD MWF, MRSA bacteremia, left foot trans metatarsal amputation on 4/13 for gangrene left foot by Dr. Sharol Given who was just discharged yesterday on 04/30/13 was brought to the ER by his daughter. History was mostly obtained from the daughter who reported that home health nurse came to the house today and check his vitals. His BP was low 80s, was lethargic and recommended that they take him to the ER.   Patient reported persistent nausea, 2 episodes of vomiting and one episode of diarrhea, generalized weakness since discharge. Patient was offered rehabilitation facilities however he refused upon discharge. Patient's daughter reports that she works during the day and her uncle who has been staying with them watches him. But patient has not done much since coming to home and has not been eating well. She strongly feels that he needs rehabilitation to get stronger. He denies having any abdominal pain at this time. He denied any hematochezia or melena.  RD spoke with patient's daughter at bedside; daughter reports pt has had a poor appetite for "a couple" weeks; has been consuming much smaller portions as well as skipping meals; also reports pt has had a 20 lb weight loss in this time period; per  weight readings, pt has had an 18% weight loss x 1 month (severe for time frame); amenable to Resource Breeze supplements; RD to order.  Nutrition Focused Physical Exam:  Subcutaneous Fat:  Orbital Region: N/A Upper Arm Region: severe depletion Thoracic and Lumbar Region: N/A  Muscle: Temple Region: mild to moderate depletion Clavicle Bone Region: severe depletion Clavicle and Acromion Bone Region: severe depletion Scapular Bone Region: N/A Dorsal Hand: N/A Patellar Region: N/A Anterior Thigh Region: N/A Posterior Calf Region: N/A  Edema: none  Patient meets criteria for severe malnutrition in the context of chronic illness as evidenced by 18% weight loss x 1 month and severe muscle & subcutaneous fat loss.  Height: Ht Readings from Last 1 Encounters:  05/02/13 '5\' 10"'  (1.778 m)    Weight: Wt Readings from Last 1 Encounters:  05/03/13 162 lb 14.7 oz (73.9 kg)    Ideal Body Weight: 166 lb  % Ideal Body Weight: 97%  Wt Readings from Last 10 Encounters:  05/03/13 162 lb 14.7 oz (73.9 kg)  04/30/13 159 lb 6.3 oz (72.3 kg)  04/30/13 159 lb 6.3 oz (72.3 kg)  04/30/13 159 lb 6.3 oz (72.3 kg)  04/30/13 159 lb 6.3 oz (72.3 kg)  04/07/13 198 lb (89.812 kg)  04/07/13 198 lb (89.812 kg)  04/06/13 189 lb (85.73 kg)  03/30/13 192 lb 11.2 oz (87.408 kg)  02/26/13 188 lb 11.4 oz (85.599 kg)    Usual Body Weight: 198 lb  % Usual Body Weight: 81%  BMI:  Body mass index is 23.38 kg/(m^2).  Estimated Nutritional Needs: Kcal: 2100-2300 Protein: 110-120 gm Fluid: 1200 ml  Skin: Stage II pressure ulcer to R & L buttocks & coccyx  Diet Order: Clear Liquid  EDUCATION NEEDS: -No education needs identified at this time   Intake/Output Summary (Last 24 hours) at 05/03/13 1531 Last data filed at 05/02/13 2130  Gross per 24 hour  Intake    240 ml  Output      0 ml  Net    240 ml     Labs:   Recent Labs Lab 04/28/13 0902 04/29/13 0510 04/30/13 1000 05/01/13 1921  05/02/13 0454 05/03/13 0334  NA 135* 139 136* 138 140 142  K 4.1 4.2 4.1 3.8 3.8 4.1  CL 91* 95* 92* 91* 95* 95*  CO2 '22 26 23 28 25 24  ' BUN 40* 21 38* 26* 29* 40*  CREATININE 8.02* 5.69* 7.94* 5.95* 6.35* 8.46*  CALCIUM 9.5 9.7 10.1 10.3 10.2 9.8  PHOS 6.3* 5.1* 6.3*  --   --   --   GLUCOSE 125* 132* 148* 170* 147* 123*    CBG (last 3)   Recent Labs  05/03/13 0749 05/03/13 1137 05/03/13 1419  GLUCAP 116* 226* 170*    Scheduled Meds: . aspirin EC  81 mg Oral Daily  . collagenase   Topical Daily  . [START ON 05/04/2013] darbepoetin (ARANESP) injection - DIALYSIS  100 mcg Intravenous Q Tue-HD  . doxercalciferol  1 mcg Intravenous Q M,W,F-HD  . enoxaparin (LOVENOX) injection  30 mg Subcutaneous Daily  . ferric gluconate (FERRLECIT/NULECIT) IV  125 mg Intravenous Q M,W,F-HD  . insulin aspart  0-9 Units Subcutaneous TID WC  . lanthanum  1,000 mg Oral TID WC  . levothyroxine  175 mcg Oral QAC breakfast  . multivitamin  1 tablet Oral QHS  . [START ON 05/04/2013] vancomycin  1,000 mg Intravenous Q T,Th,Sa-HD    Continuous Infusions:   Past Medical History  Diagnosis Date  . Hypertension   . Diabetes mellitus without complication   . Shortness of breath   . Pneumonia     2012  . Heart murmur   . Glaucoma   . Multiple myeloma, without mention of having achieved remission 03/30/2012    Cytogenetic neg on 03/23/2012.  . End stage renal disease on dialysis   . Asthma   . Hyperparathyroidism, secondary renal   . Peripheral arterial disease     Past Surgical History  Procedure Laterality Date  . Thyroidectomy    . Cervical disc surgery    . Eye surgery      CATARACTS  . Insertion of dialysis catheter Right 03/19/2012    Procedure: INSERTION OF DIALYSIS CATHETER;  Surgeon: Mal Misty, MD;  Location: [place];  Service: Vascular;  Laterality: Right;  Right Internal Jugular  . Av fistula placement Left 03/25/2012    Procedure: ARTERIOVENOUS (AV) FISTULA CREATION;  Surgeon:  Mal Misty, MD;  Location: [place];  Service: Vascular;  Laterality: Left;  . Ligation of competing branches of arteriovenous fistula Left 05/08/2012    Procedure: LIGATION OF COMPETING BRANCHES OF ARTERIOVENOUS FISTULA;  Surgeon: Mal Misty, MD;  Location: [place];  Service: Vascular;  Laterality: Left;  Ultrasound guided  . Cardiac catheterization      approx 30 years ago  . Amputation Right 11/10/2012    Procedure: AMPUTATION FIRST and SECOND TOES Right Foot;  Surgeon: Elam Dutch, MD;  Location: [facility] OR;  Service: Vascular;  Laterality: Right;  . Transmetatarsal amputation Left 12/16/2012    Procedure: TRANSMETATARSAL AMPUTATION AND VAC PLACEMENT;  Surgeon: Elam Dutch, MD;  Location: [place];  Service: Vascular;  Laterality: Left;  . Amputation Left 04/07/2013    Procedure: AMPUTATION DIGIT- LEFT 1ST TOE;  Surgeon: Mal Misty, MD;  Location: [place];  Service: Vascular;  Laterality: Left;  . Tee without cardioversion N/A 04/20/2013    Procedure: TRANSESOPHAGEAL ECHOCARDIOGRAM (TEE);  Surgeon: Josue Hector, MD;  Location: [facility] ENDOSCOPY;  Service: Cardiovascular;  Laterality: N/A;  . I&d extremity Left 04/22/2013    Procedure: [id number] HAL DEBRIDEMENT LEFT FIRST TOE AMPUTATION WOUND ;  Surgeon: Mal Misty, MD;  Location: [place];  Service: Vascular;  Laterality: Left;  . Amputation Left 04/26/2013    Procedure: Left Foot Transmetatarsal Amputation;  Surgeon: Newt Minion, MD;  Location: [place];  Service: Orthopedics;  Laterality: Left;  . Toe amputation      D/C 04-30-13    Arthur Holms, RD, LDN Pager #: [phone] After-Hours Pager #: [phone]

## 2013-05-03 NOTE — Progress Notes (Signed)
[facility] TEAM 1 - Stepdown/ICU TEAM Progress Note  MELVYN HOMMES AXE:[id number] DOB: 1945/10/27 DOA: 05/01/2013 PCP: [name], MD  Admit HPI / Brief Narrative: 68 year old male with history of diabetes, hypertension, hypothyroidism, ESRD on HD MWF, MRSA bacteremia, left foot trans metatarsal amputation on 4/13 for gangrene left foot by Dr. Sharol Given who was just discharged on 04/30/13 and was brought back to the ER by his daughter who reported that home health nurse came to the house and checked his vitals. His BP was low 80s, he was lethargic, and the RN then recommended that they take him to the ER. Patient reported persistent nausea, 2 episodes of vomiting and one episode of diarrhea, with generalized weakness since discharge. During his prior admit the patient was offered SNF placement for rehab, however he refused. Patient's daughter reported that she works during the day and her uncle who has been staying with them watches him. But patient has not done much since going home and has not been eating well. She strongly feels that he needs rehabilitation to get stronger.   Recent sequence of events: 3/25 - L first toe amputation per Dr. Kellie Simmering 4/3 - admit for post-op wound infection w/ MRSA bacteremia (wound and blood cx +) - plan for 8 weeks IV vanc per ID  4/7 - TEE - no evidence of vegetation - ejection fraction of 65% 4/9 - I&D left first toe amputation wound  4/13 - L TMA per Dr. Sharol Given 4/17 - d/c home after pt refuses SNF recommendation  4/18 - returned to ER   HPI/Subjective: Patient reports that he feels "much much better today."  He is still having some loose stool.  He denies pain in his left foot, abdominal pain, shortness of breath, or chest pain.  Assessment/Plan:  Hypotension Likely due primarily to hypovolemia due to increased loss (diarrhea) with diminished intake - improving with gentle hydration   N/V - Diarrhea C diff negative during prior admit - recheck pending -  stopped zosyn   Failure to thrive  Pt was previously advised that he needed SNF as it was not felt he would be safe at home - he refused and was therefore d/c home - he did not do well at home and returned to the ER ~24hrs later - SNF placement will be our recommendation again, as it was during his prior admission - patient now states that he understands he will need placement in a rehabilitation facility  MRSA bacteremia Previously diagnosed - NOT a new finding - cont Vanc - no indication of other acute infections presently, therefore stopped Zosyn and following on Vanc alone  Diabetic toe gangrene / Wound infection after surgery / s/p TMA  Cont Vanc- wound today with some minimal bloody output but no purulent d/c and edges well approximated with stitches - check wound QOD - f/u w/ Dr. Sharol Given as previously prescribed unless acute complication encountered   Diabetes mellitus type 1 with renal and vascular complications  CBG reasonably controlled at present - follow trend w/o change today   Anemia in chronic kidney disease  Hgb appears to be stable  End stage renal disease on HD (M/W/F) Nephrology following   Hypothyroid  Cont home synthroid dose  Peripheral vascular disease As above  Hx of multiple myeloma   Code Status: FULL Family Communication: no family present at time of exam Disposition Plan: stable for tranfer to renal floor today - begin search for SNF   Consultants: Nephrology  Procedures: none  Antibiotics: Zosyn 4/18 Vanc (at admit) >>  DVT prophylaxis: lovenox  Objective: Blood pressure 92/40, pulse 80, temperature 98.6 F (37 C), temperature source Oral, resp. rate 14, height '5\' 10"'  (1.778 m), weight 73.9 kg (162 lb 14.7 oz), SpO2 99.00%.  Intake/Output Summary (Last 24 hours) at 05/03/13 1156 Last data filed at 05/02/13 2130  Gross per 24 hour  Intake    240 ml  Output      0 ml  Net    240 ml   Exam: General: No acute respiratory distress Lungs:  Clear to auscultation bilaterally without wheezes or crackles Cardiovascular: Regular rate and rhythm without murmur gallop or rub  Abdomen: Nontender, nondistended, soft, bowel sounds positive, no rebound, no ascites, no appreciable mass Extremities: No significant cyanosis, clubbing, or edema bilateral lower extremities - L foot wound with minimal bloody output on dressing but no purulent d/c and edges well approximated with stitches when dressing taken down   Data Reviewed: Basic Metabolic Panel:  Recent Labs Lab 04/26/13 1233 04/27/13 0620 04/28/13 0902 04/29/13 0510 04/30/13 1000 05/01/13 1921 05/02/13 0454 05/03/13 0334  NA 138 136* 135* 139 136* 138 140 142  K 3.8 4.1 4.1 4.2 4.1 3.8 3.8 4.1  CL 93* 92* 91* 95* 92* 91* 95* 95*  CO2 '24 21 22 26 23 28 25 24  ' GLUCOSE 85 99 125* 132* 148* 170* 147* 123*  BUN 43* 28* 40* 21 38* 26* 29* 40*  CREATININE 9.05* 6.27* 8.02* 5.69* 7.94* 5.95* 6.35* 8.46*  CALCIUM 9.7 9.3 9.5 9.7 10.1 10.3 10.2 9.8  PHOS 6.3* 5.6* 6.3* 5.1* 6.3*  --   --   --    Liver Function Tests:  Recent Labs Lab 04/28/13 0902 04/29/13 0510 04/30/13 1000 05/01/13 1921 05/03/13 0334  AST  --   --   --  12 9  ALT  --   --   --  6 <5  ALKPHOS  --   --   --  102 89  BILITOT  --   --   --  0.7 0.8  PROT  --   --   --  9.0* 8.0  ALBUMIN 2.4* 2.6* 2.5* 2.6* 2.2*    Recent Labs Lab 05/01/13 1921  LIPASE 7*   CBC:  Recent Labs Lab 04/29/13 0510 04/30/13 1000 05/01/13 1921 05/02/13 0454 05/03/13 0334  WBC 15.1* 16.6* 23.9* 22.1* 20.4*  NEUTROABS  --   --  21.1*  --   --   HGB 10.8* 10.2* 11.0* 10.0* 9.5*  HCT 34.5* 32.4* 35.2* 32.8* 31.2*  MCV 85.2 84.4 86.1 86.5 86.4  PLT 220 237 234 251 238   CBG:  Recent Labs Lab 05/02/13 1552 05/02/13 1934 05/03/13 0037 05/03/13 0423 05/03/13 0749  GLUCAP 107* 110* 106* 112* 116*    Recent Results (from the past 240 hour(s))  CLOSTRIDIUM DIFFICILE BY PCR     Status: None   Collection Time     04/24/13 12:25 AM      Result Value Ref Range Status   C difficile by pcr NEGATIVE  NEGATIVE Final  CULTURE, BLOOD (ROUTINE X 2)     Status: None   Collection Time    05/01/13  8:50 PM      Result Value Ref Range Status   Specimen Description BLOOD RIGHT ARM   Final   Special Requests BOTTLES DRAWN AEROBIC AND ANAEROBIC 10CC EA   Final   Culture  Setup Time     Final  Value: 05/02/2013 02:24     Performed at [facility]   Culture     Final   Value:        BLOOD CULTURE RECEIVED NO GROWTH TO DATE CULTURE WILL BE HELD FOR 5 DAYS BEFORE ISSUING A FINAL NEGATIVE REPORT     Performed at [facility]   Report Status PENDING   Incomplete  CULTURE, BLOOD (ROUTINE X 2)     Status: None   Collection Time    05/01/13  9:00 PM      Result Value Ref Range Status   Specimen Description BLOOD RIGHT HAND   Final   Special Requests BOTTLES DRAWN AEROBIC ONLY [employer]   Final   Culture  Setup Time     Final   Value: 05/02/2013 02:24     Performed at [facility]   Culture     Final   Value:        BLOOD CULTURE RECEIVED NO GROWTH TO DATE CULTURE WILL BE HELD FOR 5 DAYS BEFORE ISSUING A FINAL NEGATIVE REPORT     Performed at [facility]   Report Status PENDING   Incomplete  MRSA PCR SCREENING     Status: None   Collection Time    05/02/13  1:08 AM      Result Value Ref Range Status   MRSA by PCR NEGATIVE  NEGATIVE Final   Comment:            The GeneXpert MRSA Assay (FDA     approved for NASAL specimens     only), is one component of a     comprehensive MRSA colonization     surveillance program. It is not     intended to diagnose MRSA     infection nor to guide or     monitor treatment for     MRSA infections.     Studies:  Recent x-ray studies have been reviewed in detail by the Attending Physician  Scheduled Meds:  Scheduled Meds: . aspirin EC  81 mg Oral Daily  . calcium acetate  667 mg Oral TID WC  . collagenase   Topical Daily  .  doxercalciferol  1 mcg Intravenous Q M,W,F-HD  . enoxaparin (LOVENOX) injection  30 mg Subcutaneous Daily  . ferric gluconate (FERRLECIT/NULECIT) IV  125 mg Intravenous Q M,W,F-HD  . insulin aspart  0-9 Units Subcutaneous TID WC  . levothyroxine  175 mcg Oral QAC breakfast  . multivitamin  1 tablet Oral QHS  . vancomycin  1,000 mg Intravenous Q M,W,F-HD    Time spent on care of this patient: 35 mins   Cherene Altes , MD   [facility] Office  [phone] Pager - Text Page per Amion as per below:  On-Call/Text Page:      [email or web address]      password [email or web address]  If 7PM-7AM, please contact night-coverage [email or web address] Password [email or web address] 05/03/2013, 11:56 AM   LOS: 2 days

## 2013-05-03 NOTE — Evaluation (Signed)
Occupational Therapy Evaluation Patient Details Name: Jack [name] MRN: [id number] DOB: 1945-08-07 Today's Date: 05/03/2013    History of Present Illness Pt underwent Lt transmetatarsal amputation 04/26/13 due to extensive  gangrene of Lt. foot Pt is to be strict NWB of left LE; Pt went home after last admission despite PT recs for post-acute rehab, and now returns with N&V, hypovolemia, lethargy   Clinical Impression   Patient is s/p LT transmetatarsal amputation 04/26/13 surgery resulting in functional limitations due to the deficits listed below (see OT problem list).  Patient will benefit from skilled OT acutely to increase independence and safety with ADLS to allow discharge SNF. OT to follow acutely to work on basic transfer: sliding board to w/c, sliding board to 3n1 drop arm, and adl retraining.     Follow Up Recommendations  SNF;Supervision/Assistance - 24 hour;CIR (Family requesting CIR consult)    Equipment Recommendations  3 in 1 bedside comode;Wheelchair (measurements OT);Wheelchair cushion (measurements OT);Hospital bed    Recommendations for Other Services       Precautions / Restrictions Precautions Precautions: Fall Other Brace/Splint: no darco shoes in room at this time Restrictions LLE Weight Bearing: Non weight bearing Other Position/Activity Restrictions: strict NWB LLE      Mobility Bed Mobility Overal bed mobility: Needs Assistance Bed Mobility: Supine to Sit Rolling: Min assist (bed rail)   Supine to sit: Mod assist (bed rails)     General bed mobility comments: needs step by step cueing to sequence task. pt with delayed response to all cueing  Transfers Overall transfer level: Needs assistance Equipment used: Rolling walker (2 wheeled);2 person hand held assist Transfers: Sit to/from Stand Sit to Stand: Max assist;+2 physical assistance Stand pivot transfers: Max assist;+2 physical assistance       General transfer comment: pt was unable  to sequence task and max (A) to prevent WB on LT LE. pt with poor recall of precautions    Balance Overall balance assessment: Needs assistance Sitting-balance support: Feet supported;Bilateral upper extremity supported Sitting balance-Leahy Scale: Fair                                      ADL Overall ADL's : Needs assistance/impaired Eating/Feeding: Set up;Bed level   Grooming: Set up;Sitting   Upper Body Bathing: Minimal assitance;Sitting   Lower Body Bathing: Maximal assistance;Sitting/lateral leans;Adhering to back precautions           Toilet Transfer: +2 for physical assistance;Maximal assistance;Cueing for safety;Cueing for sequencing;Stand-pivot           Functional mobility during ADLs: Maximal assistance;+2 for physical assistance;+2 for safety/equipment;Rolling walker (stand pivot unable to progress with ambulation) General ADL Comments: pt supine on arrival watching TV. pt agreeable to session but reports fatigue. Pt requires multiple cueing to initiate session. pt states "okay I am going to" pt educated on the importance of mobility and participating with therapy for d/c recommendations. Daughter present and requesting CIR admission. pt required (A) to complete sit<>stand and pt was unable to pivot without (A) to facilitate weight shift and hips swinging into chair. pt does not have darco shoe present but pt and daughter reports he was not wearing it on the Rt foot.      Vision                     Perception Perception Perception Tested?: No   Praxis  Pertinent Vitals/Pain No pain reported     Hand Dominance Right   Extremity/Trunk Assessment Upper Extremity Assessment Upper Extremity Assessment: Overall WFL for tasks assessed   Lower Extremity Assessment Lower Extremity Assessment: Defer to PT evaluation LLE Deficits / Details: noted bleeding from dressing site. Pt provided additional dressing over existing bandages . RN  called and notified that dressing was reinforced for session.   Cervical / Trunk Assessment Cervical / Trunk Assessment: Kyphotic   Communication Communication Communication: No difficulties   Cognition Arousal/Alertness: Awake/alert Behavior During Therapy: Flat affect Overall Cognitive Status: Impaired/Different from baseline Area of Impairment: Problem solving;Safety/judgement;Awareness;Memory     Memory: Decreased short-term memory;Decreased recall of precautions   Safety/Judgement: Decreased awareness of safety Awareness: Emergent Problem Solving: Slow processing;Decreased initiation;Difficulty sequencing;Requires verbal cues General Comments: Pt unable to recall weight bearing status. pt educated and transfering with max v/c . Pt asked at end of session to tell RN weight bearing precautions and states "i can put 100 pounds on it" pt again educated NWB LT LE. pt verbalizes agreement to mobility and task during session but lacks initiation. Pt verbally aggressive to daughter and then apologizes to therapist states "My daughter is too fast and aggressive. she gets on my nerves" . the patients daughter was encouraging patient to wear glasses and handing glasses to patient. Patients reaction was extreme to daughters offer to provide glasses   General Comments       Exercises       Shoulder Instructions      Home Living Family/patient expects to be discharged to:: Private residence Living Arrangements: Children Available Help at Discharge: Family;Available 24 hours/day Type of Home: Apartment Home Access: Level entry     Home Layout: One level     Bathroom Shower/Tub: Teacher, early years/pre: Standard     Home Equipment: Environmental consultant - 2 wheels;Cane - single point;Shower seat;Wheelchair - manual          Prior Functioning/Environment Level of Independence: Independent with assistive device(s)        Comments: As of 4/10, pt was ambulating with RW unless pain  was too bad; primarily in wheelchair recently due to pain; could transfer to wheelchair independently     OT Diagnosis: Generalized weakness;Cognitive deficits;Acute pain   OT Problem List: Decreased strength;Decreased activity tolerance;Impaired balance (sitting and/or standing);Decreased safety awareness;Decreased knowledge of use of DME or AE;Decreased knowledge of precautions;Pain;Decreased cognition;Cardiopulmonary status limiting activity   OT Treatment/Interventions: Self-care/ADL training;DME and/or AE instruction;Therapeutic exercise;Therapeutic activities;Cognitive remediation/compensation;Patient/family education;Balance training    OT Goals(Current goals can be found in the care plan section) Acute Rehab OT Goals Patient Stated Goal: to take a nap OT Goal Formulation: With patient/family Time For Goal Achievement: 05/17/13 Potential to Achieve Goals: Good  OT Frequency: Min 2X/week   Barriers to D/C:            Co-evaluation              End of Session Equipment Utilized During Treatment: Gait belt;Rolling walker Nurse Communication: Mobility status;Precautions  Activity Tolerance: Patient tolerated treatment well (decr oxygen to 97% ) Patient left: in chair;with call bell/phone within reach;with family/visitor present;with nursing/sitter in room   Time: 0946-1010 OT Time Calculation (min): 24 min Charges:  OT General Charges $OT Visit: 1 Procedure OT Evaluation $Initial OT Evaluation Tier I: 1 Procedure OT Treatments $Therapeutic Activity: 8-22 mins G-Codes:    Peri Maris 2013-05-06, 12:13 PM Pager: [phone]

## 2013-05-04 DIAGNOSIS — R7881 Bacteremia: Secondary | ICD-10-CM

## 2013-05-04 DIAGNOSIS — I959 Hypotension, unspecified: Secondary | ICD-10-CM

## 2013-05-04 DIAGNOSIS — E039 Hypothyroidism, unspecified: Secondary | ICD-10-CM

## 2013-05-04 DIAGNOSIS — E869 Volume depletion, unspecified: Secondary | ICD-10-CM

## 2013-05-04 DIAGNOSIS — A4902 Methicillin resistant Staphylococcus aureus infection, unspecified site: Secondary | ICD-10-CM

## 2013-05-04 DIAGNOSIS — R509 Fever, unspecified: Secondary | ICD-10-CM

## 2013-05-04 DIAGNOSIS — E108 Type 1 diabetes mellitus with unspecified complications: Secondary | ICD-10-CM

## 2013-05-04 DIAGNOSIS — M869 Osteomyelitis, unspecified: Secondary | ICD-10-CM

## 2013-05-04 DIAGNOSIS — R5381 Other malaise: Secondary | ICD-10-CM

## 2013-05-04 DIAGNOSIS — E43 Unspecified severe protein-calorie malnutrition: Secondary | ICD-10-CM | POA: Insufficient documentation

## 2013-05-04 DIAGNOSIS — I96 Gangrene, not elsewhere classified: Secondary | ICD-10-CM

## 2013-05-04 DIAGNOSIS — R5383 Other fatigue: Secondary | ICD-10-CM

## 2013-05-04 LAB — COMPREHENSIVE METABOLIC PANEL
ALT: <5 U/L (ref 0–53)
AST: 10 U/L (ref 0–37)
Albumin: 2.4 g/dL — ABNORMAL LOW (ref 3.5–5.2)
Alkaline Phosphatase: 85 U/L (ref 39–117)
BILIRUBIN TOTAL: 0.7 mg/dL (ref 0.3–1.2)
BUN: 53 mg/dL — ABNORMAL HIGH (ref 6–23)
CO2: 23 meq/L (ref 19–32)
CREATININE: 10.4 mg/dL — AB (ref 0.50–1.35)
Calcium: 9.8 mg/dL (ref 8.4–10.5)
Chloride: 92 mEq/L — ABNORMAL LOW (ref 96–112)
GFR calc Af Amer: 5 mL/min — ABNORMAL LOW (ref >90)
GFR, EST NON AFRICAN AMERICAN: 4 mL/min — AB (ref >90)
GLUCOSE: 149 mg/dL — AB (ref 70–99)
Potassium: 3.9 mEq/L (ref 3.7–5.3)
Sodium: 142 mEq/L (ref 137–147)
Total Protein: 8.3 g/dL (ref 6.0–8.3)

## 2013-05-04 LAB — CBC WITH DIFFERENTIAL/PLATELET
Basophils Absolute: 0 10*3/uL (ref 0.0–0.1)
Basophils Relative: 0 % (ref 0–1)
EOS ABS: 0.1 10*3/uL (ref 0.0–0.7)
Eosinophils Relative: 0 % (ref 0–5)
HCT: 30.3 % — ABNORMAL LOW (ref 39.0–52.0)
Hemoglobin: 9.3 g/dL — ABNORMAL LOW (ref 13.0–17.0)
LYMPHS ABS: 1.3 10*3/uL (ref 0.7–4.0)
LYMPHS PCT: 9 % — AB (ref 12–46)
MCH: 26.2 pg (ref 26.0–34.0)
MCHC: 30.7 g/dL (ref 30.0–36.0)
MCV: 85.4 fL (ref 78.0–100.0)
Monocytes Absolute: 1.1 10*3/uL — ABNORMAL HIGH (ref 0.1–1.0)
Monocytes Relative: 7 % (ref 3–12)
NEUTROS PCT: 84 % — AB (ref 43–77)
Neutro Abs: 12.5 10*3/uL — ABNORMAL HIGH (ref 1.7–7.7)
PLATELETS: 277 10*3/uL (ref 150–400)
RBC: 3.55 MIL/uL — AB (ref 4.22–5.81)
RDW: 14.8 % (ref 11.5–15.5)
WBC: 15 10*3/uL — ABNORMAL HIGH (ref 4.0–10.5)

## 2013-05-04 LAB — GLUCOSE, CAPILLARY
GLUCOSE-CAPILLARY: 116 mg/dL — AB (ref 70–99)
Glucose-Capillary: 96 mg/dL (ref 70–99)
Glucose-Capillary: 117 mg/dL — ABNORMAL HIGH (ref 70–99)
Glucose-Capillary: 131 mg/dL — ABNORMAL HIGH (ref 70–99)
Glucose-Capillary: 121 mg/dL — ABNORMAL HIGH (ref 70–99)
Glucose-Capillary: 143 mg/dL — ABNORMAL HIGH (ref 70–99)

## 2013-05-04 LAB — CBC
HEMATOCRIT: 32 % — AB (ref 39.0–52.0)
Hemoglobin: 9.8 g/dL — ABNORMAL LOW (ref 13.0–17.0)
MCH: 26.2 pg (ref 26.0–34.0)
MCHC: 30.6 g/dL (ref 30.0–36.0)
MCV: 85.6 fL (ref 78.0–100.0)
Platelets: 262 10*3/uL (ref 150–400)
RBC: 3.74 MIL/uL — ABNORMAL LOW (ref 4.22–5.81)
RDW: 15 % (ref 11.5–15.5)
WBC: 15.6 10*3/uL — AB (ref 4.0–10.5)

## 2013-05-04 LAB — PHOSPHORUS: PHOSPHORUS: 7 mg/dL — AB (ref 2.3–4.6)

## 2013-05-04 LAB — MAGNESIUM: MAGNESIUM: 2.3 mg/dL (ref 1.5–2.5)

## 2013-05-04 NOTE — Progress Notes (Signed)
Pt transferred to [place]. Called unit CSW and spoke with her intern; informed intern recommendation is for SNF. Pt was recently hospitalized and discharged home. CIR also looking at pt. This CSW signing off.   Ky Barban, MSW, [facility] Clinical Social Worker [phone]

## 2013-05-04 NOTE — Clinical Documentation Improvement (Signed)
  Per Notes this patient was treated for "post-op wound infection w/ MRSA bacteremia (wound and blood cx +)" post L great toe amputation being treated with 8 weeks IV Vancamycin. Is now admitted with Bacteremia with hypotension SBP 80's, Rectal temp 98.8, lethargy, N/V/D, WBC 23.9, Stump with "++erythema". Patient treated with IVF, IV Clindamycin and Vanc. Please clarify if the admitting diagnosis is "Bacteremia" or "Early Sepsis". Thank you.  Thank You, Ezekiel Ina ,RN Clinical Documentation Specialist:  [phone]  [place] Information Management

## 2013-05-04 NOTE — Progress Notes (Signed)
Pt was transferred to 6E24 via bed. Pt alert and oriented x4. NWB to left foot. Has a unstageable wound on his buttocks with dressing clean dry and intact dated for today. Pt has a dressing on his left foot with a small amount of old drainage on it. Pt denies any pain. Pt oriented to the unit and staff. Will cont to monitor.

## 2013-05-04 NOTE — Progress Notes (Signed)
OT Cancellation Note  Patient Details Name: Jack [name] MRN: [id number] DOB: 09/24/1945   Cancelled Treatment:    Reason Eval/Treat Not Completed: Patient at procedure or test/ unavailable (at HD)  Peri Maris Pager: [phone]  05/04/2013, 8:07 AM

## 2013-05-04 NOTE — Progress Notes (Signed)
TEAM 1 - Stepdown/ICU TEAM Progress Note  Jack [name] VZD:[id number] DOB: 1945-02-25 DOA: 05/01/2013 PCP: [name], MD  Admit HPI / Brief Narrative: 68 year old BM PMHx diabetes, hypertension, hypothyroidism, ESRD on HD MWF, MRSA bacteremia, left foot trans metatarsal amputation on 4/13 for gangrene left foot by Dr. Sharol Given who was just discharged on 04/30/13 and was brought back to the ER by his daughter who reported that home health nurse came to the house and checked his vitals. His BP was low 80s, he was lethargic, and the RN then recommended that they take him to the ER. Patient reported persistent nausea, 2 episodes of vomiting and one episode of diarrhea, with generalized weakness since discharge. During his prior admit the patient was offered SNF placement for rehab, however he refused. Patient's daughter reported that she works during the day and her uncle who has been staying with them watches him. But patient has not done much since going home and has not been eating well. She strongly feels that he needs rehabilitation to get stronger.    HPI/Subjective: 4/21 patient states feels greatly improved since admission, and understand it was a mistake to not go to SNF following left foot trans metatarsal amputation on 4/13 for gangrene left foot. Negative CP/SOB, negative N./V./D.   Assessment/Plan: Hypotension  Likely due primarily to hypovolemia due to increased loss (diarrhea) with diminished intake - improving with gentle hydration   N/V - Diarrhea  C diff negative during prior admit - recheck pending  Failure to thrive  Pt was previously advised that he needed SNF as it was not felt he would be safe at home - he refused and was therefore d/c home - he did not do well at home and returned to the ER ~24hrs later  - SNF placement; counseled patient and wife would need to be discharged to SNF, both agreed.    MRSA bacteremia  -Previously diagnosed - NOT a new  finding  - cont Vanc   Diabetic toe gangrene / Wound infection after surgery / s/p TMA  -Cont Vanc - 4/20 wound today with some minimal bloody output but no purulent d/c and edges well approximated with stitches  - check wound QOD - f/u w/ Dr. Sharol Given as previously prescribed unless acute complication encountered  -4/21 consult wound care for recommendations caring for patient's left foot wound site  Leukocytosis -Continue to monitor; has not begun to trend down, negative left shift or bands, patient has been afebrile so most likely  Diabetes mellitus type 1 with renal and vascular complications  CBG reasonably controlled at present; no change today in regimen   Anemia in chronic kidney disease  Hgb appears to be stable   End stage renal disease on HD (T/Th/Sat)  -Nephrology following -Patient with left AV fistula   Hypothyroid  Cont home synthroid dose   Peripheral vascular disease  As above   Hx of multiple myeloma    Code Status: FULL  Family Communication: Family at bedside   Disposition Plan: SNF      Consultants: Dr. Roney Jaffe (nephrology)   Procedure/Significant Events: 3/25 - L first toe amputation per Dr. Kellie Simmering  4/3 - admit for post-op wound infection w/ MRSA bacteremia (wound and blood cx +) - plan for 8 weeks IV vanc per ID  4/7 - TEE - no evidence of vegetation - ejection fraction of 65%  4/9 - I&D left first toe amputation wound  4/13 - L TMA per Dr. Sharol Given  4/17 - d/c home after pt refuses SNF recommendation  4/18 - returned to ER    Culture  05/02/2013 MRSA  by PCR negative   Antibiotics: Vancomycin every T/Th/Sat at HD     DVT prophylaxis: Lovenox   Devices    LINES / TUBES:  4/18 20 Ga right antecubital     Continuous Infusions:   Objective: VITAL SIGNS: Temp: 98.4 F (36.9 C) (04/21 0805) Temp src: Oral (04/21 0805) BP: 104/63 mmHg (04/21 0830) Pulse Rate: 117 (04/21 0830) FIO2:   Intake/Output Summary (Last 24  hours) at 05/04/13 0845 Last data filed at 05/03/13 2300  Gross per 24 hour  Intake    120 ml  Output      0 ml  Net    120 ml     Exam: General: A./O. x4, NAD  Lungs: Clear to auscultation bilaterally without wheezes or crackles Cardiovascular: Regular rate and rhythm without murmur gallop or rub normal S1 and S2 Renalbalance today;        /overall;        Creatinine ; 8.46       Hourly output   Abdomen: Nontender, nondistended, soft, bowel sounds positive, no rebound, no ascites, no appreciable mass Extremities: Left AV fistula present; negative signs of infection, No significant cyanosis, clubbing, or edema bilateral lower extremities  Data Reviewed: Basic Metabolic Panel:  Recent Labs Lab 04/28/13 0902 04/29/13 0510 04/30/13 1000 05/01/13 1921 05/02/13 0454 05/03/13 0334  NA 135* 139 136* 138 140 142  K 4.1 4.2 4.1 3.8 3.8 4.1  CL 91* 95* 92* 91* 95* 95*  CO2 '22 26 23 28 25 24  ' GLUCOSE 125* 132* 148* 170* 147* 123*  BUN 40* 21 38* 26* 29* 40*  CREATININE 8.02* 5.69* 7.94* 5.95* 6.35* 8.46*  CALCIUM 9.5 9.7 10.1 10.3 10.2 9.8  PHOS 6.3* 5.1* 6.3*  --   --   --    Liver Function Tests:  Recent Labs Lab 04/28/13 0902 04/29/13 0510 04/30/13 1000 05/01/13 1921 05/03/13 0334  AST  --   --   --  12 9  ALT  --   --   --  6 <5  ALKPHOS  --   --   --  102 89  BILITOT  --   --   --  0.7 0.8  PROT  --   --   --  9.0* 8.0  ALBUMIN 2.4* 2.6* 2.5* 2.6* 2.2*    Recent Labs Lab 05/01/13 1921  LIPASE 7*   No results found for this basename: AMMONIA,  in the last 168 hours CBC:  Recent Labs Lab 04/30/13 1000 05/01/13 1921 05/02/13 0454 05/03/13 0334 05/04/13 0404  WBC 16.6* 23.9* 22.1* 20.4* 15.6*  NEUTROABS  --  21.1*  --   --   --   HGB 10.2* 11.0* 10.0* 9.5* 9.8*  HCT 32.4* 35.2* 32.8* 31.2* 32.0*  MCV 84.4 86.1 86.5 86.4 85.6  PLT 237 234 251 238 262   Cardiac Enzymes: No results found for this basename: CKTOTAL, CKMB, CKMBINDEX, TROPONINI,  in the  last 168 hours BNP (last 3 results) No results found for this basename: PROBNP,  in the last 8760 hours CBG:  Recent Labs Lab 05/03/13 1644 05/03/13 2002 05/03/13 2324 05/04/13 0415 05/04/13 0743  GLUCAP 187* 72 96 117* 131*    Recent Results (from the past 240 hour(s))  CULTURE, BLOOD (ROUTINE X 2)     Status: None   Collection Time  05/01/13  8:50 PM      Result Value Ref Range Status   Specimen Description BLOOD RIGHT ARM   Final   Special Requests BOTTLES DRAWN AEROBIC AND ANAEROBIC 10CC EA   Final   Culture  Setup Time     Final   Value: 05/02/2013 02:24     Performed at [facility]   Culture     Final   Value:        BLOOD CULTURE RECEIVED NO GROWTH TO DATE CULTURE WILL BE HELD FOR 5 DAYS BEFORE ISSUING A FINAL NEGATIVE REPORT     Performed at [facility]   Report Status PENDING   Incomplete  CULTURE, BLOOD (ROUTINE X 2)     Status: None   Collection Time    05/01/13  9:00 PM      Result Value Ref Range Status   Specimen Description BLOOD RIGHT HAND   Final   Special Requests BOTTLES DRAWN AEROBIC ONLY [facility]   Final   Culture  Setup Time     Final   Value: 05/02/2013 02:24     Performed at [facility]   Culture     Final   Value:        BLOOD CULTURE RECEIVED NO GROWTH TO DATE CULTURE WILL BE HELD FOR 5 DAYS BEFORE ISSUING A FINAL NEGATIVE REPORT     Performed at [facility]   Report Status PENDING   Incomplete  MRSA PCR SCREENING     Status: None   Collection Time    05/02/13  1:08 AM      Result Value Ref Range Status   MRSA by PCR NEGATIVE  NEGATIVE Final   Comment:            The GeneXpert MRSA Assay (FDA     approved for NASAL specimens     only), is one component of a     comprehensive MRSA colonization     surveillance program. It is not     intended to diagnose MRSA     infection nor to guide or     monitor treatment for     MRSA infections.     Studies:  Recent x-ray studies have been reviewed in detail by  the Attending Physician  Scheduled Meds:  Scheduled Meds: . aspirin EC  81 mg Oral Daily  . collagenase   Topical Daily  . darbepoetin (ARANESP) injection - DIALYSIS  100 mcg Intravenous Q Tue-HD  . doxercalciferol  1 mcg Intravenous Q M,W,F-HD  . enoxaparin (LOVENOX) injection  30 mg Subcutaneous Daily  . feeding supplement (RESOURCE BREEZE)  1 Container Oral TID BM  . ferric gluconate (FERRLECIT/NULECIT) IV  125 mg Intravenous Q T,Th,Sa-HD  . insulin aspart  0-9 Units Subcutaneous TID WC  . lanthanum  1,000 mg Oral TID WC  . levothyroxine  175 mcg Oral QAC breakfast  . multivitamin  1 tablet Oral QHS  . vancomycin  1,000 mg Intravenous Q T,Th,Sa-HD    Time spent on care of this patient: 40 mins   Allie Bossier , MD   [facility] Office  [phone] Pager [phone]  On-Call/Text Page:      [email or web address]      password [email or web address]  If 7PM-7AM, please contact night-coverage [email or web address] Password [email or web address] 05/04/2013, 8:45 AM   LOS: 3 days

## 2013-05-04 NOTE — Progress Notes (Signed)
Occupational Therapy Treatment Patient Details Name: Jack [name] MRN: [id number] DOB: 1945/10/21 Today's Date: 05/04/2013    History of present illness Pt underwent Lt transmetatarsal amputation 04/26/13 due to extensive  gangrene of Lt. foot Pt is to be strict NWB of left LE; Pt went home after last admission despite PT recs for post-acute rehab, and now returns with N&V, hypovolemia, lethargy   OT comments  Pt demonstrates poor return demo of sliding board transfer. Therefore, continue to recommend snf. Pt with cognitive deficits .   Follow Up Recommendations  SNF;Supervision/Assistance - 24 hour    Equipment Recommendations  3 in 1 bedside comode;Wheelchair (measurements OT);Wheelchair cushion (measurements OT);Hospital bed (mattress over lay)    Recommendations for Other Services      Precautions / Restrictions Precautions Precautions: Fall Other Brace/Splint: no darco shoes in room at this time Restrictions Weight Bearing Restrictions: Yes LLE Weight Bearing: Non weight bearing       Mobility Bed Mobility Overal bed mobility: Needs Assistance Bed Mobility: Supine to Sit Rolling: Min assist Sidelying to sit: Mod assist Supine to sit: Mod assist Sit to supine: Mod assist   General bed mobility comments: cues for sequence and tactile input to facilitate weight shift  Transfers Overall transfer level: Needs assistance   Transfers: Lateral/Scoot Transfers          Lateral/Scoot Transfers: Max assist;+2 physical assistance;With slide board;From elevated surface General transfer comment: pt demo a poor return deom of sliding board with max cueing. Pt required pad use and needed hand placement throughout session.Pt moving hands once positioned and needing reeducation    Balance                                   ADL Overall ADL's : Needs assistance/impaired                         Toilet Transfer: +2 for physical assistance;Maximal  assistance;Transfer board   Toileting- Clothing Manipulation and Hygiene: Bed level;Total assistance         General ADL Comments: Pt supine on arrival and agreeable to education on sliding board. Ot attempting education on sliding board to incr independence and prevent weight bearing on LT LE. pt requires facilitation to complete supine <>Sit. pt not follow commands to complete sliding board transfer. pt required two person (A) with pad to transfer. Pt attempting to push BIL LE into floor      Vision                     Perception     Praxis      Cognition   Behavior During Therapy: Flat affect Overall Cognitive Status: Impaired/Different from baseline Area of Impairment: Attention;Memory;Safety/judgement;Awareness;Problem solving   Current Attention Level: Focused Memory: Decreased short-term memory    Safety/Judgement: Decreased awareness of safety;Decreased awareness of deficits Awareness: Emergent Problem Solving: Slow processing;Decreased initiation General Comments: Pt asking RN for scissors and when asked why pt states to cut my hair. Pt unaware of void of bowel.     Extremity/Trunk Assessment               Exercises     Shoulder Instructions       General Comments      Pertinent Vitals/ Pain       Fatigue reported Void of bowel without c/o or awareness Buttock showing breakdown-  concern for skin integrity  Home Living                                          Prior Functioning/Environment              Frequency Min 2X/week     Progress Toward Goals  OT Goals(current goals can now be found in the care plan section)  Progress towards OT goals: Progressing toward goals  Acute Rehab OT Goals Patient Stated Goal: to take a nap OT Goal Formulation: With patient/family Time For Goal Achievement: 05/17/13 Potential to Achieve Goals: Good ADL Goals Pt Will Perform Upper Body Bathing: with set-up;sitting Pt Will  Perform Lower Body Bathing: with set-up;sit to/from stand Pt Will Transfer to Toilet: with mod assist;with transfer board;bedside commode Additional ADL Goal #1: Pt will complete transfer with sliding board to w/c  with Mod (A) Additional ADL Goal #2: Pt will complete bed mobility with superivsion hob flat  Plan Discharge plan remains appropriate    Co-evaluation                 End of Session     Activity Tolerance Patient tolerated treatment well   Patient Left in bed;with call bell/phone within reach;with nursing/sitter in room (Rn addressing sacral wound)   Nurse Communication Mobility status;Precautions;Need for lift equipment        Time: 7654-6503 OT Time Calculation (min): 23 min  Charges: OT General Charges $OT Visit: 1 Procedure OT Treatments $Therapeutic Activity: 23-37 mins  Peri Maris 05/04/2013, 4:13 PM Pager: [phone]

## 2013-05-04 NOTE — Progress Notes (Signed)
PT Cancellation Note  Patient Details Name: DEMETRUS PAVAO MRN: [id number] DOB: 04/29/1945   Cancelled Treatment:    Reason Eval/Treat Not Completed: 1) In a.m. Patient at procedure or test/unavailable (hemodialysis); 2) In p.m., Patient declined, no reason specified (kept eyes closed, very brief answers to questions, declined to participate in any activity offered). Did agree he would try tomorrow on his non-dialysis day.   Jeanie Cooks Lerline Valdivia 05/04/2013, 2:35 PM Pager [phone]

## 2013-05-04 NOTE — Progress Notes (Signed)
Patient is getting transferred to 6E24 per hospital bed.  Patient's daughter, [name], is made aware of this transfer.  Dressing to sacral area was changed by wound care nurse.

## 2013-05-04 NOTE — Procedures (Signed)
I was present at this dialysis session, have reviewed the session itself and made  appropriate changes  [name] MD (pgr) [phone]    [phone] 05/04/2013, 12:33 PM

## 2013-05-04 NOTE — Progress Notes (Signed)
ANTIBIOTIC CONSULT NOTE  Pharmacy Consult for Vanco Indication: Cellulitis, r/o bacteremia  Allergies  Allergen Reactions  . Bee Venom Anaphylaxis  . Lisinopril Cough  . Ivp Dye [Iodinated Diagnostic Agents] Hives  . Morphine And Related Other (See Comments)    Bradycardia states patient    Patient Measurements: Height: '5\' 10"'  (177.8 cm) Weight: 162 lb 14.7 oz (73.9 kg) IBW/kg (Calculated) : 73 Adjusted Body Weight:    Vital Signs: Temp: 98.6 F (37 C) (04/21 0700) Temp src: Oral (04/21 0700) BP: 113/72 mmHg (04/21 0700) Pulse Rate: 80 (04/21 0700) Intake/Output from previous day: 04/20 0701 - 04/21 0700 In: 120 [P.O.:120] Out: -  Intake/Output from this shift:    Labs:  Recent Labs  05/01/13 1921 05/02/13 0454 05/03/13 0334 05/04/13 0404  WBC 23.9* 22.1* 20.4* 15.6*  HGB 11.0* 10.0* 9.5* 9.8*  PLT 234 251 238 262  CREATININE 5.95* 6.35* 8.46*  --    Estimated Creatinine Clearance: 8.5 ml/min (by C-G formula based on Cr of 8.46). No results found for this basename: VANCOTROUGH, Corlis Leak, VANCORANDOM, GENTTROUGH, GENTPEAK, GENTRANDOM, TOBRATROUGH, TOBRAPEAK, TOBRARND, AMIKACINPEAK, AMIKACINTROU, AMIKACIN,  in the last 72 hours   Microbiology: Recent Results (from the past 720 hour(s))  SURGICAL PCR SCREEN     Status: None   Collection Time    04/17/13  5:10 AM      Result Value Ref Range Status   MRSA, PCR NEGATIVE  NEGATIVE Final   Staphylococcus aureus NEGATIVE  NEGATIVE Final   Comment:            The Xpert SA Assay (FDA     approved for NASAL specimens     in patients over 69 years of age),     is one component of     a comprehensive surveillance     program.  Test performance has     been validated by [employer] for patients greater     than or equal to 31 year old.     It is not intended     to diagnose infection nor to     guide or monitor treatment.  CULTURE, BLOOD (ROUTINE X 2)     Status: None   Collection Time    04/17/13  5:48  AM      Result Value Ref Range Status   Specimen Description BLOOD RIGHT HAND   Final   Special Requests BOTTLES DRAWN AEROBIC ONLY 5CC   Final   Culture  Setup Time     Final   Value: 04/17/2013 12:44     Performed at [facility]   Culture     Final   Value: METHICILLIN RESISTANT STAPHYLOCOCCUS AUREUS     Note: RIFAMPIN AND GENTAMICIN SHOULD NOT BE USED AS SINGLE DRUGS FOR TREATMENT OF STAPH INFECTIONS. This organism DOES NOT demonstrate inducible Clindamycin resistance in vitro. CRITICAL RESULT CALLED TO, READ BACK BY AND VERIFIED WITH: JAMIE COVINGTON      04/19/13 1437 BY SMITHERSJ     Note: Gram Stain Report Called to,Read Back By and Verified With: Virgilio Frees 04/18/12 @ 10:45AM BY RUSCOE A.     Performed at [facility]   Report Status 04/20/2013 FINAL   Final   Organism ID, Bacteria METHICILLIN RESISTANT STAPHYLOCOCCUS AUREUS   Final  CULTURE, BLOOD (ROUTINE X 2)     Status: None   Collection Time    04/17/13  5:50 AM      Result Value  Ref Range Status   Specimen Description BLOOD RIGHT HAND   Final   Special Requests     Final   Value: BOTTLES DRAWN AEROBIC AND ANAEROBIC 10CC BLUE,5CC RED   Culture  Setup Time     Final   Value: 04/17/2013 12:44     Performed at [facility]   Culture     Final   Value: STAPHYLOCOCCUS AUREUS     Note: SUSCEPTIBILITIES PERFORMED ON PREVIOUS CULTURE WITHIN THE LAST 5 DAYS.     Note: Gram Stain Report Called to,Read Back By and Verified With: Earleen Reaper RN on 04/18/13 at 05:50 by Rise Mu     Performed at [facility]   Report Status 04/20/2013 FINAL   Final  CULTURE, BLOOD (ROUTINE X 2)     Status: None   Collection Time    04/19/13  1:04 PM      Result Value Ref Range Status   Specimen Description BLOOD RIGHT HAND   Final   Special Requests BOTTLES DRAWN AEROBIC ONLY [facility]   Final   Culture  Setup Time     Final   Value: 04/19/2013 16:06     Performed at [facility]   Culture     Final    Value: NO GROWTH 5 DAYS     Performed at [facility]   Report Status 04/25/2013 FINAL   Final  CULTURE, BLOOD (ROUTINE X 2)     Status: None   Collection Time    04/19/13  1:10 PM      Result Value Ref Range Status   Specimen Description BLOOD RIGHT ANTECUBITAL   Final   Special Requests BOTTLES DRAWN AEROBIC ONLY 5CC   Final   Culture  Setup Time     Final   Value: 04/19/2013 16:06     Performed at [facility]   Culture     Final   Value: NO GROWTH 5 DAYS     Performed at [facility]   Report Status 04/25/2013 FINAL   Final  WOUND CULTURE     Status: None   Collection Time    04/22/13 10:37 AM      Result Value Ref Range Status   Specimen Description WOUND LEFT FOOT   Final   Special Requests LEFT FIRST TOE PT ON VANCO VANCO   Final   Gram Stain     Final   Value: FEW WBC PRESENT, PREDOMINANTLY PMN     FEW SQUAMOUS EPITHELIAL CELLS PRESENT     MODERATE GRAM POSITIVE COCCI IN CLUSTERS     MODERATE GRAM POSITIVE RODS     Performed at [facility]   Culture     Final   Value: ABUNDANT METHICILLIN RESISTANT STAPHYLOCOCCUS AUREUS     Note: RIFAMPIN AND GENTAMICIN SHOULD NOT BE USED AS SINGLE DRUGS FOR TREATMENT OF STAPH INFECTIONS. This organism DOES NOT demonstrate inducible Clindamycin resistance in vitro. CRITICAL RESULT CALLED TO, READ BACK BY AND VERIFIED WITH: ALBERT R @ 7:40AM      04/25/13 BY DWEEKS     Performed at [facility]   Report Status 04/25/2013 FINAL   Final   Organism ID, Bacteria METHICILLIN RESISTANT STAPHYLOCOCCUS AUREUS   Final  CLOSTRIDIUM DIFFICILE BY PCR     Status: None   Collection Time    04/24/13 12:25 AM      Result Value Ref Range Status   C difficile by pcr NEGATIVE  NEGATIVE  Final  CULTURE, BLOOD (ROUTINE X 2)     Status: None   Collection Time    05/01/13  8:50 PM      Result Value Ref Range Status   Specimen Description BLOOD RIGHT ARM   Final   Special Requests BOTTLES DRAWN AEROBIC AND ANAEROBIC  10CC EA   Final   Culture  Setup Time     Final   Value: 05/02/2013 02:24     Performed at [facility]   Culture     Final   Value:        BLOOD CULTURE RECEIVED NO GROWTH TO DATE CULTURE WILL BE HELD FOR 5 DAYS BEFORE ISSUING A FINAL NEGATIVE REPORT     Performed at [facility]   Report Status PENDING   Incomplete  CULTURE, BLOOD (ROUTINE X 2)     Status: None   Collection Time    05/01/13  9:00 PM      Result Value Ref Range Status   Specimen Description BLOOD RIGHT HAND   Final   Special Requests BOTTLES DRAWN AEROBIC ONLY [facility]   Final   Culture  Setup Time     Final   Value: 05/02/2013 02:24     Performed at [facility]   Culture     Final   Value:        BLOOD CULTURE RECEIVED NO GROWTH TO DATE CULTURE WILL BE HELD FOR 5 DAYS BEFORE ISSUING A FINAL NEGATIVE REPORT     Performed at [facility]   Report Status PENDING   Incomplete  MRSA PCR SCREENING     Status: None   Collection Time    05/02/13  1:08 AM      Result Value Ref Range Status   MRSA by PCR NEGATIVE  NEGATIVE Final   Comment:            The GeneXpert MRSA Assay (FDA     approved for NASAL specimens     only), is one component of a     comprehensive MRSA colonization     surveillance program. It is not     intended to diagnose MRSA     infection nor to guide or     monitor treatment for     MRSA infections.    Medical History: Past Medical History  Diagnosis Date  . Hypertension   . Diabetes mellitus without complication   . Shortness of breath   . Pneumonia     2012  . Heart murmur   . Glaucoma   . Multiple myeloma, without mention of having achieved remission 03/30/2012    Cytogenetic neg on 03/23/2012.  . End stage renal disease on dialysis   . Asthma   . Hyperparathyroidism, secondary renal   . Peripheral arterial disease     Medications: pending  Assessment: Nausea, Emesis, diarrhea 68 y/o M with recent discharge returned with gangrenous foot s/p toe  amputation on the left side, with continued nausea, vomiting, diarrhea, weakness, inability to ambulate, "coldness." Patient is ESRD with last HD 4/17. MD does note erythema around his left foot postop wound. Patient on Vancomycin.  He did not have HD on Mon 4/20 and schedule changed to TThS.   Goal of Therapy:  Vanco level <20 after HD for redosing  Plan:  Continue Vancomycin 1g IV TThS with HD   Excell Seltzer, PharmD Clinical Staff Pharmacist Pager [phone]  05/04/2013,7:55 AM

## 2013-05-04 NOTE — Progress Notes (Signed)
[facility] Progress Note  Subjective:   Nausea persists. Not tolerating PO intake. No other c/o's  Objective Filed Vitals:   05/04/13 0805 05/04/13 0809 05/04/13 0830 05/04/13 0900  BP: 120/60 110/59 104/63 99/61  Pulse: 74 69 117   Temp: 98.4 F (36.9 C)     TempSrc: Oral     Resp: '13 13 16   ' Height:      Weight: 72.7 kg (160 lb 4.4 oz)     SpO2: 98%  96%    Physical Exam General: Frail, alert, NAD Heart: RRR. 2/6 murmur Lungs: CTA bilat, no wheezes or rhonchi Abdomen: Soft, NT, + BS Extremities: Lt transmet amp wrapped with bloody drainage, Rt transmet amp without ulcerations, No LE edema Dialysis Access: L AVF + bruit  Dialysis: [facility]WF [facility]  4h 400/800 85.5kg (Probably closer to 73 kg per wgts last admit)  4Heparin 3500 LUA AVF   Assessment/Plan: 1. Hypotension - Improving 2. N/V/D - Mgmt per primary. C diff negative last admit. Repeat pending 3. Wound infection left foot / MRSA bacteremia - (on recent admit, this is not new) s/p L great toe amputation 3/25, I&D 4/9 per Dr. Kellie Simmering, TEE 4/7 showed moderate LVH, EF 65%, no vegetation; L transmet amputation per Dr. Sharol Given 4/15. On IV Vancomycin through 05/28/13 per ID recs last admit. Repeat cx's neg thus far. 4. ESRD - Will be TTS this admit. K+ 4.1, HD today  5. HTN/Volume - Soft SBPs. Bystolic held. Doxazosin dc'd last admit; Post wgt on 4/17was 72.3 kg. UF as tolerated today. Will keep SBP > 100.  Needs lower edw at d/c  6. Anemia - Hgb down to 9.5. No op ESAs or Fe. None last admit. Add Aranesp 100. Follow labs.  7. Sec HPT - Ca 9.8 (11.2 corrected), Last P 6.3. Hectorol 1 held last admit. Use 2Ca bath and change Phoslo to Fosrenol.  8. Malnutrition - Alb 2.2. On clears, multivitamin, supplement.  9. DM - insulin per primary. 10. Hx Multiple myeloma - Next f/u with Onc is in June. 11. Dispo - Refused SNF last admit. Per primary, will recommend again due to FTT and safety issues. Insurance will not cover CIR  with transmet amputation per notes.  [name]. Rhodia Albright [facility] Pager [phone] 05/04/2013,9:44 AM  LOS: 3 days   Pt seen, examined and agree w A/P as above.  [name] MD pager [phone]    cell [phone] 05/04/2013, 12:34 PM    Additional Objective Labs: Basic Metabolic Panel:  Recent Labs Lab 04/29/13 0510 04/30/13 1000  05/02/13 0454 05/03/13 0334 05/04/13 0847  NA 139 136*  < > 140 142 142  K 4.2 4.1  < > 3.8 4.1 3.9  CL 95* 92*  < > 95* 95* 92*  CO2 26 23  < > '25 24 23  ' GLUCOSE 132* 148*  < > 147* 123* 149*  BUN 21 38*  < > 29* 40* 53*  CREATININE 5.69* 7.94*  < > 6.35* 8.46* 10.40*  CALCIUM 9.7 10.1  < > 10.2 9.8 9.8  PHOS 5.1* 6.3*  --   --   --  7.0*  < > = values in this interval not displayed. Liver Function Tests:  Recent Labs Lab 05/01/13 1921 05/03/13 0334 05/04/13 0847  AST '12 9 10  ' ALT 6 <5 <5  ALKPHOS 102 89 85  BILITOT 0.7 0.8 0.7  PROT 9.0* 8.0 8.3  ALBUMIN 2.6* 2.2* 2.4*    Recent Labs  Lab 05/01/13 1921  LIPASE 7*   CBC:  Recent Labs Lab 05/01/13 1921 05/02/13 0454 05/03/13 0334 05/04/13 0404 05/04/13 0847  WBC 23.9* 22.1* 20.4* 15.6* 15.0*  NEUTROABS 21.1*  --   --   --  12.5*  HGB 11.0* 10.0* 9.5* 9.8* 9.3*  HCT 35.2* 32.8* 31.2* 32.0* 30.3*  MCV 86.1 86.5 86.4 85.6 85.4  PLT 234 251 238 262 277   Blood Culture    Component Value Date/Time   SDES BLOOD RIGHT HAND 05/01/2013 2100   SPECREQUEST BOTTLES DRAWN AEROBIC ONLY [facility] 05/01/2013 2100   CULT  Value:        BLOOD CULTURE RECEIVED NO GROWTH TO DATE CULTURE WILL BE HELD FOR 5 DAYS BEFORE ISSUING A FINAL NEGATIVE REPORT Performed at [place] 05/01/2013 2100   REPTSTATUS PENDING 05/01/2013 2100    CBG:  Recent Labs Lab 05/03/13 1644 05/03/13 2002 05/03/13 2324 05/04/13 0415 05/04/13 0743  GLUCAP 187* 72 96 117* 131*   Studies/Results: No results found. Medications:   . aspirin EC  81 mg Oral Daily  . collagenase    Topical Daily  . darbepoetin (ARANESP) injection - DIALYSIS  100 mcg Intravenous Q Tue-HD  . enoxaparin (LOVENOX) injection  30 mg Subcutaneous Daily  . feeding supplement (RESOURCE BREEZE)  1 Container Oral TID BM  . ferric gluconate (FERRLECIT/NULECIT) IV  125 mg Intravenous Q T,Th,Sa-HD  . insulin aspart  0-9 Units Subcutaneous TID WC  . lanthanum  1,000 mg Oral TID WC  . levothyroxine  175 mcg Oral QAC breakfast  . multivitamin  1 tablet Oral QHS  . vancomycin  1,000 mg Intravenous Q T,Th,Sa-HD

## 2013-05-04 NOTE — Consult Note (Signed)
WOC wound follow up Dr Sharol Given following for assessment and plan of care to lower extremity wound. Pt frequently incontinent of stool, difficult to promote healing to buttocks wounds. Left buttock with previous stage 2 wound which has healed.   Fissure over sacral/gluteal crack; .5X.2X.2cm, 100% red and moist, mod amt yellow drainage. Appearance consistent with moisture associated skin damage. Right buttock with unstageable wound 3X3cm, 100% yellow slough tightly adhered to skin. Dressing procedure/placement/frequency: Continue Santyl for chemical debridement of nonviable tissue. Julien Girt MSN, RN, [place], [place], [place]

## 2013-05-05 ENCOUNTER — Inpatient Hospital Stay (HOSPITAL_COMMUNITY): Payer: Medicare Other

## 2013-05-05 LAB — CBC WITH DIFFERENTIAL/PLATELET
Basophils Absolute: 0 10*3/uL (ref 0.0–0.1)
Basophils Relative: 0 % (ref 0–1)
Eosinophils Absolute: 0.1 10*3/uL (ref 0.0–0.7)
Eosinophils Relative: 0 % (ref 0–5)
HEMATOCRIT: 32.1 % — AB (ref 39.0–52.0)
HEMOGLOBIN: 9.6 g/dL — AB (ref 13.0–17.0)
LYMPHS PCT: 10 % — AB (ref 12–46)
Lymphs Abs: 1.3 10*3/uL (ref 0.7–4.0)
MCH: 26.1 pg (ref 26.0–34.0)
MCHC: 29.9 g/dL — AB (ref 30.0–36.0)
MCV: 87.2 fL (ref 78.0–100.0)
MONOS PCT: 9 % (ref 3–12)
Monocytes Absolute: 1.2 10*3/uL — ABNORMAL HIGH (ref 0.1–1.0)
NEUTROS PCT: 81 % — AB (ref 43–77)
Neutro Abs: 11 10*3/uL — ABNORMAL HIGH (ref 1.7–7.7)
Platelets: 280 10*3/uL (ref 150–400)
RBC: 3.68 MIL/uL — AB (ref 4.22–5.81)
RDW: 15 % (ref 11.5–15.5)
WBC: 13.6 10*3/uL — AB (ref 4.0–10.5)

## 2013-05-05 LAB — COMPREHENSIVE METABOLIC PANEL
ALT: 7 U/L (ref 0–53)
AST: 13 U/L (ref 0–37)
Albumin: 2.3 g/dL — ABNORMAL LOW (ref 3.5–5.2)
Alkaline Phosphatase: 88 U/L (ref 39–117)
BUN: 24 mg/dL — ABNORMAL HIGH (ref 6–23)
CO2: 24 mEq/L (ref 19–32)
CREATININE: 5.84 mg/dL — AB (ref 0.50–1.35)
Calcium: 9.2 mg/dL (ref 8.4–10.5)
Chloride: 93 mEq/L — ABNORMAL LOW (ref 96–112)
GFR calc non Af Amer: 9 mL/min — ABNORMAL LOW (ref >90)
GFR, EST AFRICAN AMERICAN: 10 mL/min — AB (ref >90)
Glucose, Bld: 125 mg/dL — ABNORMAL HIGH (ref 70–99)
Potassium: 3.6 mEq/L — ABNORMAL LOW (ref 3.7–5.3)
Sodium: 139 mEq/L (ref 137–147)
TOTAL PROTEIN: 8.2 g/dL (ref 6.0–8.3)
Total Bilirubin: 0.7 mg/dL (ref 0.3–1.2)

## 2013-05-05 LAB — GLUCOSE, CAPILLARY
GLUCOSE-CAPILLARY: 131 mg/dL — AB (ref 70–99)
GLUCOSE-CAPILLARY: 109 mg/dL — AB (ref 70–99)
Glucose-Capillary: 172 mg/dL — ABNORMAL HIGH (ref 70–99)
Glucose-Capillary: 86 mg/dL (ref 70–99)

## 2013-05-05 MED ORDER — POTASSIUM CHLORIDE CRYS ER 20 MEQ PO TBCR
30.0000 meq | EXTENDED_RELEASE_TABLET | Freq: Once | ORAL | Status: AC
  Administered 2013-05-05: 30 meq via ORAL
  Filled 2013-05-05: qty 1

## 2013-05-05 NOTE — Progress Notes (Signed)
Patient Demographics  Jack [name], is a 68 y.o. male, DOB - [birth year], LAG:[id number]  Admit date - 05/01/2013   Admitting Physician Jack Krystal Eaton, MD  Outpatient Primary MD for the patient is Jack Peers, MD  LOS - 4   Chief Complaint  Patient presents with  . Nausea  . Emesis  . Diarrhea       Brief Narrative:    68 year old BM PMHx diabetes, hypertension, hypothyroidism, ESRD on HD MWF, MRSA bacteremia, left foot trans metatarsal amputation on 4/13 for gangrene left foot by Jack [name] who was just discharged on 04/30/13 and was brought back to the ER by his daughter who reported that home health nurse came to the house and checked his vitals. His BP was low 80s, he was lethargic, and the RN then recommended that they take him to the ER. Patient reported persistent nausea, 2 episodes of vomiting and one episode of diarrhea, with generalized weakness since discharge. During his prior admit the patient was offered SNF placement for rehab, however he refused. Patient's daughter reported that she works during the day and her uncle who has been staying with them watches him. But patient has not done much since going home and has not been eating well. She strongly feels that he needs rehabilitation to get stronger.     Assessment & Plan    Hypotension  Likely due primarily to hypovolemia due to increased loss (diarrhea) with diminished intake - improving with gentle hydration. Recheck one-time acute abdomen series to rule out SBO ileus et Jack [name].    N/V - Diarrhea  C diff negative during prior admit - recheck pending     Failure to thrive  Pt was previously advised that he needed SNF as it was not felt he would be safe at home - he refused and was therefore d/c home - he did not do well  at home and returned to the ER ~24hrs later  - SNF placement; counseled patient and wife would need to be discharged to SNF, both agreed. Social work consulted.     MRSA bacteremia  -Previously diagnosed - NOT a new finding,  cont Vanc supposed to get total of 8 weeks start date appears to be 04/17/2013 according to the last ID noted. Posterior follow with Jack [name] last week of April or first week of June. Repeat cultures this admission are negative.     Diabetic toe gangrene / Wound infection after surgery / s/p TMA  -Cont Vanc, continue W Care,  f/u w/ Jack [name] as previously prescribed unless acute complication encountered       Leukocytosis  -Continue to monitor; has  begun to trend down, negative left shift or bands, patient has been afebrile.    Diabetes mellitus type 1 with renal and vascular complications  CBG reasonably controlled at present; no change today in regimen   CBG (last 3)   Recent Labs  05/04/13 1659 05/04/13 2051 05/05/13 0807  GLUCAP 116* 143* 131*     Lab Results  Component Value Date   HGBA1C 6.5* 05/02/2013     Anemia in chronic kidney disease  Hgb appears to be stable     End stage renal disease on HD (T/Th/Sat)  -Nephrology following ,  TTS dialysis -Patient with left AV fistula     Hypothyroid  Cont home synthroid dose    Peripheral vascular disease   As above    Hx of multiple myeloma   Out patient follow with primary hematologist oncologist post discharge.     Code Status: full  Family Communication: daughter  Disposition Plan: SNF   Procedures   Last recent admission    3/25 - L first toe amputation per Dr. Kellie [name]  4/3 - admit for post-op wound infection w/ MRSA bacteremia (wound and blood cx +) - plan for 8 weeks IV vanc per ID  4/7 - TEE - no evidence of vegetation - ejection fraction of 65%  4/9 - I&D left first toe amputation wound  4/13 - L TMA per Jack [name]  4/17 - d/c home after pt refuses SNF  recommendation    4/18 - returned to ER - This admission    Consults   Renal   Medications  Scheduled Meds: . aspirin EC  81 mg Oral Daily  . collagenase   Topical Daily  . darbepoetin (ARANESP) injection - DIALYSIS  100 mcg Intravenous Q Tue-HD  . enoxaparin (LOVENOX) injection  30 mg Subcutaneous Daily  . feeding supplement (RESOURCE BREEZE)  1 Container Oral TID BM  . ferric gluconate (FERRLECIT/NULECIT) IV  125 mg Intravenous Q T,Th,Sa-HD  . insulin aspart  0-9 Units Subcutaneous TID WC  . lanthanum  1,000 mg Oral TID WC  . levothyroxine  175 mcg Oral QAC breakfast  . multivitamin  1 tablet Oral QHS  . potassium chloride  30 mEq Oral Once  . vancomycin  1,000 mg Intravenous Q T,Th,Sa-HD   Continuous Infusions:  PRN Meds:.acetaminophen, acetaminophen, loperamide, ondansetron (ZOFRAN) IV, ondansetron, oxyCODONE  DVT Prophylaxis  Lovenox    Lab Results  Component Value Date   PLT 280 05/05/2013    Antibiotics   Anti-infectives   Start     Dose/Rate Route Frequency Ordered Stop   05/04/13 1200  vancomycin (VANCOCIN) IVPB 1000 mg/200 mL premix    Comments:  Will dialyze on TTS schedule this admit   1,000 mg 200 mL/hr over 60 Minutes Intravenous Every T-Th-Sa (Hemodialysis) 05/03/13 1407     05/03/13 1200  vancomycin (VANCOCIN) IVPB 1000 mg/200 mL premix  Status:  Discontinued     1,000 mg 200 mL/hr over 60 Minutes Intravenous Every M-W-F (Hemodialysis) 05/01/13 2116 05/03/13 1407   05/02/13 0600  piperacillin-tazobactam (ZOSYN) IVPB 2.25 g  Status:  Discontinued     2.25 g 100 mL/hr over 30 Minutes Intravenous 3 times per day 05/01/13 2117 05/02/13 1053   05/01/13 2200  piperacillin-tazobactam (ZOSYN) IVPB 2.25 g  Status:  Discontinued     2.25 g 100 mL/hr over 30 Minutes Intravenous 3 times per day 05/01/13 2116 05/01/13 2117   05/01/13 2100  piperacillin-tazobactam (ZOSYN) IVPB 3.375 g     3.375 g 100 mL/hr over 30 Minutes Intravenous  Once 05/01/13 2058  05/01/13 2140   05/01/13 2100  vancomycin (VANCOCIN) IVPB 1000 mg/200 mL premix  Status:  Discontinued     1,000 mg 200 mL/hr over 60 Minutes Intravenous  Once 05/01/13 2058 05/01/13 2117          Subjective:   Jack [name] today has, No headache, No chest pain, No abdominal pain - No Nausea, No new weakness tingling or numbness, No Cough - SOB.    Objective:   Filed Vitals:   05/04/13 1715 05/04/13 2054 05/05/13 0615 05/05/13  0809  BP: 107/67 101/66 110/63 121/72  Pulse: 78 78 76 73  Temp: 99.2 F (37.3 C) 99 F (37.2 C) 98.9 F (37.2 C) 98.7 F (37.1 C)  TempSrc: Oral Oral Oral Oral  Resp: $Remo'20 20 18 19  'EeQLP$ Height:  $Remove'5\' 10"'MxzgTWV$  (1.778 m)    Weight:  71.4 kg (157 lb 6.5 oz)    SpO2: 98% 97% 98% 95%    Wt Readings from Last 3 Encounters:  05/04/13 71.4 kg (157 lb 6.5 oz)  04/30/13 72.3 kg (159 lb 6.3 oz)  04/30/13 72.3 kg (159 lb 6.3 oz)     Intake/Output Summary (Last 24 hours) at 05/05/13 1152 Last data filed at 05/04/13 1223  Gross per 24 hour  Intake     50 ml  Output      0 ml  Net     50 ml     Physical Exam  Awake Alert, Oriented X 3, No new F.N deficits, Normal affect [place].AT,PERRAL Supple Neck,No JVD, No cervical lymphadenopathy appriciated.  Symmetrical Chest wall movement, Good air movement bilaterally, CTAB RRR,No Gallops,Rubs or new Murmurs, No Parasternal Heave +ve B.Sounds, Abd Soft, Non tender, No organomegaly appriciated, No rebound - guarding or rigidity. No Cyanosis, Clubbing or edema, No new Rash or bruise , Left AV fistula present, L foot in bandage    Data Review   Micro Results Recent Results (from the past 240 hour(s))  CULTURE, BLOOD (ROUTINE X 2)     Status: None   Collection Time    05/01/13  8:50 PM      Result Value Ref Range Status   Specimen Description BLOOD RIGHT ARM   Final   Special Requests BOTTLES DRAWN AEROBIC AND ANAEROBIC 10CC EA   Final   Culture  Setup Time     Final   Value: 05/02/2013 02:24     Performed at  [facility]   Culture     Final   Value:        BLOOD CULTURE RECEIVED NO GROWTH TO DATE CULTURE WILL BE HELD FOR 5 DAYS BEFORE ISSUING A FINAL NEGATIVE REPORT     Performed at [facility]   Report Status PENDING   Incomplete  CULTURE, BLOOD (ROUTINE X 2)     Status: None   Collection Time    05/01/13  9:00 PM      Result Value Ref Range Status   Specimen Description BLOOD RIGHT HAND   Final   Special Requests BOTTLES DRAWN AEROBIC ONLY [facility]   Final   Culture  Setup Time     Final   Value: 05/02/2013 02:24     Performed at [facility]   Culture     Final   Value:        BLOOD CULTURE RECEIVED NO GROWTH TO DATE CULTURE WILL BE HELD FOR 5 DAYS BEFORE ISSUING A FINAL NEGATIVE REPORT     Performed at [facility]   Report Status PENDING   Incomplete  MRSA PCR SCREENING     Status: None   Collection Time    05/02/13  1:08 AM      Result Value Ref Range Status   MRSA by PCR NEGATIVE  NEGATIVE Final   Comment:            The GeneXpert MRSA Assay (FDA     approved for NASAL specimens     only), is one component of a     comprehensive MRSA  colonization     surveillance program. It is not     intended to diagnose MRSA     infection nor to guide or     monitor treatment for     MRSA infections.    Radiology Reports Dg Chest 2 View  05/01/2013   CLINICAL DATA:  Shortness of breath.  Asthma.  Nausea and vomiting.  EXAM: CHEST  2 VIEW  COMPARISON:  02/23/2013  FINDINGS: Mildly improved aeration of both lungs is noted. Elevation of right hemidiaphragm again seen. No evidence of pulmonary infiltrate or edema. No evidence of pleural effusion. Heart size and mediastinal contours are stable.  IMPRESSION: Stable elevation of right hemidiaphragm.  No active disease.   Electronically Signed   By: Earle Gell M.D.   On: 05/01/2013 21:34       CBC  Recent Labs Lab 05/01/13 1921 05/02/13 0454 05/03/13 0334 05/04/13 0404 05/04/13 0847 05/05/13 0627  WBC  23.9* 22.1* 20.4* 15.6* 15.0* 13.6*  HGB 11.0* 10.0* 9.5* 9.8* 9.3* 9.6*  HCT 35.2* 32.8* 31.2* 32.0* 30.3* 32.1*  PLT 234 251 238 262 277 280  MCV 86.1 86.5 86.4 85.6 85.4 87.2  MCH 26.9 26.4 26.3 26.2 26.2 26.1  MCHC 31.3 30.5 30.4 30.6 30.7 29.9*  RDW 15.0 15.1 15.0 15.0 14.8 15.0  LYMPHSABS 1.4  --   --   --  1.3 1.3  MONOABS 1.4*  --   --   --  1.1* 1.2*  EOSABS 0.0  --   --   --  0.1 0.1  BASOSABS 0.0  --   --   --  0.0 0.0    Chemistries   Recent Labs Lab 05/01/13 1921 05/02/13 0454 05/03/13 0334 05/04/13 0847 05/05/13 0627  NA 138 140 142 142 139  K 3.8 3.8 4.1 3.9 3.6*  CL 91* 95* 95* 92* 93*  CO2 $Re'28 25 24 23 24  'KGl$ GLUCOSE 170* 147* 123* 149* 125*  BUN 26* 29* 40* 53* 24*  CREATININE 5.95* 6.35* 8.46* 10.40* 5.84*  CALCIUM 10.3 10.2 9.8 9.8 9.2  MG  --   --   --  2.3  --   AST 12  --  $R'9 10 13  'cv$ ALT 6  --  <5 <5 7  ALKPHOS 102  --  89 85 88  BILITOT 0.7  --  0.8 0.7 0.7   ------------------------------------------------------------------------------------------------------------------ estimated creatinine clearance is 12.1 ml/min (by C-G formula based on Cr of 5.84). ------------------------------------------------------------------------------------------------------------------ No results found for this basename: HGBA1C,  in the last 72 hours ------------------------------------------------------------------------------------------------------------------ No results found for this basename: CHOL, HDL, LDLCALC, TRIG, CHOLHDL, LDLDIRECT,  in the last 72 hours ------------------------------------------------------------------------------------------------------------------ No results found for this basename: TSH, T4TOTAL, FREET3, T3FREE, THYROIDAB,  in the last 72 hours ------------------------------------------------------------------------------------------------------------------ No results found for this basename: VITAMINB12, FOLATE, FERRITIN, TIBC, IRON,  RETICCTPCT,  in the last 72 hours  Coagulation profile No results found for this basename: INR, PROTIME,  in the last 168 hours  No results found for this basename: DDIMER,  in the last 72 hours  Cardiac Enzymes No results found for this basename: CK, CKMB, TROPONINI, MYOGLOBIN,  in the last 168 hours ------------------------------------------------------------------------------------------------------------------ No components found with this basename: POCBNP,      Time Spent in minutes   35   Thurnell Lose M.D on 05/05/2013 at 11:52 AM  Between 7am to 7pm - Pager - [phone]  After 7pm go to [email or web address] - password [email or web address]  And look for the night  coverage person covering for me after hours  [facility] Office  [phone]  

## 2013-05-05 NOTE — Progress Notes (Signed)
[facility] Progress Note  Subjective:   Nausea last evening. Has not attempted PO intake today. Family requesting medication to stimulate appetite. Advised this can be revisited once nausea resolves.  Objective Filed Vitals:   05/04/13 1715 05/04/13 2054 05/05/13 0615 05/05/13 0809  BP: 107/67 101/66 110/63 121/72  Pulse: 78 78 76 73  Temp: 99.2 F (37.3 C) 99 F (37.2 C) 98.9 F (37.2 C) 98.7 F (37.1 C)  TempSrc: Oral Oral Oral Oral  Resp: '20 20 18 19  ' Height:  '5\' 10"'  (1.778 m)    Weight:  71.4 kg (157 lb 6.5 oz)    SpO2: 98% 97% 98% 95%   Physical Exam General: Sleepy, NAD Heart: RRR Lungs: CTA bilat, no wheezes or rhonchi Abdomen: Soft, NT + BS Extremities: L transmet amp wrapped/clean dressings, Rt transmet amp healed. No LE edema. Dry skin Dialysis Access: L AVF + bruit  Dialysis: [facility]WF [facility]  4h 400/800 85.5kg (Probably closer to 73 kg per wgts last admit)  4Heparin 3500 LUA AVF    Assessment/Plan: 1. N/V/D - Mgmt per primary. C diff negative last admit. Repeat still pending 2. Sacral wounds - Unstageable wound to Rt buttock, sacral fissure with drainage, Lt buttock with healed prev Stage 2 wound. Per [facility] 3. Wound infection left foot  -  s/p L great toe amputation 3/25, I&D 4/9 per Dr. Kellie Simmering,  L transmet amputation per Dr. Sharol Given 4/15. On IV Vancomycin through 05/28/13 per ID recs last admit 4. MRSA bacteremia - on recent admit. TEE 4/7 showed moderate LVH, EF 65%, no vegetation ;Repeat blood cultures negative thus far. On Vanc per #2 above 5. ESRD - Will be TTS this admit. K+ 3.6,  will replete. HD tomorrow with added K+ bath if needed 6. Hypotension/Volume - SBPs 110s-120s. Bystolic held. Doxazosin dc'd last admit; Losing weight. Will keep SBP > 100. UF to ~71kg tomorrow. 7. Anemia - Hgb 9.6. Aranesp 100 q Tues. Follow labs. Will likely need op ESAs at d/c 8. Sec HPT - Ca 9.2 (10.6 corrected),  Hypercalcemia improving. Continue 2Ca bath and hold Vit D.  Phos up.  9. Malnutrition - Alb 2.3. On clears, multivitamin, supplement.  Can address appetite once #1 resolved. 10. DM - insulin per primary. 11. Hx Multiple myeloma - Next f/u with Onc is in June 12. Dispo - Refused SNF last admit. Per primary, will recommend again due to FTT and safety issues.   [name]. Jack [name] [facility] Pager [phone] 05/05/2013,9:07 AM  LOS: 4 days   Pt seen, examined, agree w assess/plan as above with additions as indicated.  [name] MD pager [phone]    cell [phone] 05/05/2013, 1:01 PM      Additional Objective Labs: Basic Metabolic Panel:  Recent Labs Lab 04/29/13 0510 04/30/13 1000  05/03/13 0334 05/04/13 0847 05/05/13 0627  NA 139 136*  < > 142 142 139  K 4.2 4.1  < > 4.1 3.9 3.6*  CL 95* 92*  < > 95* 92* 93*  CO2 26 23  < > '24 23 24  ' GLUCOSE 132* 148*  < > 123* 149* 125*  BUN 21 38*  < > 40* 53* 24*  CREATININE 5.69* 7.94*  < > 8.46* 10.40* 5.84*  CALCIUM 9.7 10.1  < > 9.8 9.8 9.2  PHOS 5.1* 6.3*  --   --  7.0*  --   < > = values in this interval not displayed. Liver Function Tests:  Recent Labs Lab  05/03/13 0334 05/04/13 0847 05/05/13 0627  AST '9 10 13  ' ALT <5 <5 7  ALKPHOS 89 85 88  BILITOT 0.8 0.7 0.7  PROT 8.0 8.3 8.2  ALBUMIN 2.2* 2.4* 2.3*    Recent Labs Lab 05/01/13 1921  LIPASE 7*   CBC:  Recent Labs Lab 05/01/13 1921 05/02/13 0454 05/03/13 0334 05/04/13 0404 05/04/13 0847 05/05/13 0627  WBC 23.9* 22.1* 20.4* 15.6* 15.0* 13.6*  NEUTROABS 21.1*  --   --   --  12.5* 11.0*  HGB 11.0* 10.0* 9.5* 9.8* 9.3* 9.6*  HCT 35.2* 32.8* 31.2* 32.0* 30.3* 32.1*  MCV 86.1 86.5 86.4 85.6 85.4 87.2  PLT 234 251 238 262 277 280   Blood Culture    Component Value Date/Time   SDES BLOOD RIGHT HAND 05/01/2013 2100   SPECREQUEST BOTTLES DRAWN AEROBIC ONLY [facility] 05/01/2013 2100   CULT  Value:        BLOOD CULTURE RECEIVED NO GROWTH TO DATE CULTURE WILL BE HELD FOR 5 DAYS BEFORE ISSUING A FINAL  NEGATIVE REPORT Performed at [place] 05/01/2013 2100   REPTSTATUS PENDING 05/01/2013 2100    CBG:  Recent Labs Lab 05/04/13 0743 05/04/13 1412 05/04/13 1659 05/04/13 2051 05/05/13 0807  GLUCAP 131* 121* 116* 143* 131*    Studies/Results: No results found. Medications:   . aspirin EC  81 mg Oral Daily  . collagenase   Topical Daily  . darbepoetin (ARANESP) injection - DIALYSIS  100 mcg Intravenous Q Tue-HD  . enoxaparin (LOVENOX) injection  30 mg Subcutaneous Daily  . feeding supplement (RESOURCE BREEZE)  1 Container Oral TID BM  . ferric gluconate (FERRLECIT/NULECIT) IV  125 mg Intravenous Q T,Th,Sa-HD  . insulin aspart  0-9 Units Subcutaneous TID WC  . lanthanum  1,000 mg Oral TID WC  . levothyroxine  175 mcg Oral QAC breakfast  . multivitamin  1 tablet Oral QHS  . vancomycin  1,000 mg Intravenous Q T,Th,Sa-HD

## 2013-05-05 NOTE — Progress Notes (Signed)
Physical Therapy Treatment Patient Details Name: Jack [name] MRN: [id number] DOB: February 19, 1945 Today's Date: 05/05/2013    History of Present Illness Pt underwent Lt transmetatarsal amputation 04/26/13 due to extensive  gangrene of Lt. foot Pt is to be strict NWB of left LE; Pt went home after last admission despite PT recs for post-acute rehab, and now returns with N&V, hypovolemia, lethargy    PT Comments    Opted for sliding board transfer to drop-arm recliner as pt is strict NWB LLE and having difficulty maintianing NWB with standing activities; Pt with improved participation over last session, and showed good use of RUE, reaching for far armrest to pull self across sliding board into chair  Follow Up Recommendations  SNF     Equipment Recommendations  None recommended by PT    Recommendations for Other Services       Precautions / Restrictions Precautions Precautions: Fall Other Brace/Splint: no darco shoes in room at this time Restrictions LLE Weight Bearing: Non weight bearing Other Position/Activity Restrictions: strict NWB LLE    Mobility  Bed Mobility Overal bed mobility: Needs Assistance Bed Mobility: Supine to Sit Rolling: Min assist Sidelying to sit: Mod assist       General bed mobility comments: Cues for use of rails, and to initiate tasks  Transfers Overall transfer level: Needs assistance Equipment used:  (sliding board) Transfers: Lateral/Scoot Transfers          Lateral/Scoot Transfers: Min assist;+2 safety/equipment;With slide board General transfer comment: Seemed to perform better than yesterday with sliding board transfer; Used bed pad to slide on top of board; Still requiring cues for hand placement; Noted better follow-through with cues for technique  Ambulation/Gait                 Stairs            Wheelchair Mobility    Modified Rankin (Stroke Patients Only)       Balance Overall balance assessment: Needs  assistance Sitting-balance support: Single extremity supported;Bilateral upper extremity supported Sitting balance-Leahy Scale: Fair Sitting balance - Comments: Improved sitting posture                            Cognition Arousal/Alertness: Lethargic Behavior During Therapy: Flat affect Overall Cognitive Status: Within Functional Limits for tasks assessed                      Exercises      General Comments        Pertinent Vitals/Pain no apparent distress during transfer, but noted pt quite uncomfortable in recliner; Offered to assist pt to sit on a pillw in recliner, but he declined    Home Living                      Prior Function            PT Goals (current goals can now be found in the care plan section) Acute Rehab PT Goals PT Goal Formulation: Patient unable to participate in goal setting Time For Goal Achievement: 05/16/13 Potential to Achieve Goals: Fair Progress towards PT goals: Progressing toward goals    Frequency  Min 3X/week    PT Plan Discharge plan needs to be updated    Co-evaluation             End of Session Equipment Utilized During Treatment:  (sliding board) Activity Tolerance: Patient tolerated  treatment well Patient left: in chair;with call bell/phone within reach;with family/visitor present     Time: 1093-2355 PT Time Calculation (min): 20 min  Charges:  $Therapeutic Activity: 8-22 mins                    G Codes:      [facility] 05/05/2013, 9:48 PM Roney Marion, [place] Pager [phone] Office [phone]

## 2013-05-05 NOTE — Consult Note (Signed)
WOC consult requested for left foot.  Pt is followed by Dr Sharol Given of the ortho service for post-op assessment and plan of care.  Please consult him for further recommendations and topical treatment and please re-consult if further assistance is needed.  Thank-you,  Julien Girt MSN, [place], [place], [place], [place]

## 2013-05-06 LAB — GLUCOSE, CAPILLARY
Glucose-Capillary: 127 mg/dL — ABNORMAL HIGH (ref 70–99)
Glucose-Capillary: 165 mg/dL — ABNORMAL HIGH (ref 70–99)
Glucose-Capillary: 141 mg/dL — ABNORMAL HIGH (ref 70–99)

## 2013-05-06 LAB — CBC
HCT: 29.7 % — ABNORMAL LOW (ref 39.0–52.0)
Hemoglobin: 9.2 g/dL — ABNORMAL LOW (ref 13.0–17.0)
MCH: 26.4 pg (ref 26.0–34.0)
MCHC: 31 g/dL (ref 30.0–36.0)
MCV: 85.3 fL (ref 78.0–100.0)
Platelets: 312 10*3/uL (ref 150–400)
RBC: 3.48 MIL/uL — AB (ref 4.22–5.81)
RDW: 14.7 % (ref 11.5–15.5)
WBC: 11.4 10*3/uL — AB (ref 4.0–10.5)

## 2013-05-06 LAB — RENAL FUNCTION PANEL
Albumin: 2.3 g/dL — ABNORMAL LOW (ref 3.5–5.2)
BUN: 36 mg/dL — AB (ref 6–23)
CHLORIDE: 94 meq/L — AB (ref 96–112)
CO2: 27 meq/L (ref 19–32)
CREATININE: 8.03 mg/dL — AB (ref 0.50–1.35)
Calcium: 9.6 mg/dL (ref 8.4–10.5)
GFR calc Af Amer: 7 mL/min — ABNORMAL LOW (ref >90)
GFR calc non Af Amer: 6 mL/min — ABNORMAL LOW (ref >90)
GLUCOSE: 145 mg/dL — AB (ref 70–99)
Phosphorus: 5.3 mg/dL — ABNORMAL HIGH (ref 2.3–4.6)
Potassium: 3.6 mEq/L — ABNORMAL LOW (ref 3.7–5.3)
Sodium: 138 mEq/L (ref 137–147)

## 2013-05-06 LAB — VANCOMYCIN, RANDOM: Vancomycin Rm: 21 ug/mL

## 2013-05-06 MED ORDER — OXYCODONE HCL 5 MG PO TABS
ORAL_TABLET | ORAL | Status: AC
  Administered 2013-05-06: 10 mg via ORAL
  Filled 2013-05-06: qty 2

## 2013-05-06 NOTE — Progress Notes (Signed)
Utilization review completed.  

## 2013-05-06 NOTE — Progress Notes (Signed)
[facility] Progress Note  Subjective:   Nausea and diarrhea much better, feels "100% better" today  Objective Filed Vitals:   05/05/13 1710 05/05/13 2052 05/06/13 0543 05/06/13 0845  BP: 123/71 122/73 130/86 113/71  Pulse: 78 76 75 87  Temp: 98.4 F (36.9 C) 99.1 F (37.3 C) 98.8 F (37.1 C) 98 F (36.7 C)  TempSrc: Oral Oral Oral Oral  Resp: '18 18 20 20  ' Height:  '5\' 10"'  (1.778 m)    Weight:  72.122 kg (159 lb)    SpO2: 100% 98% 98% 98%   Physical Exam General: Sleepy, NAD Heart: RRR Lungs: CTA bilat, no wheezes or rhonchi Abdomen: Soft, NT + BS Extremities: L transmet amp wrapped/clean dressings, Rt old transmet amp healed. No LE edema. Dry skin Dialysis Access: L AVF + bruit  Dialysis: [facility]  4h 400/800 85.5kg dry wt  Heparin 3500 LUA AVF    Assessment: 1. Diarrhea / nausea / vomiting- improved; Cdif neg last admit 4/11, not done this time 2. Sacral decub- early stage, per WOC 3. L foot wound infection / MRSA bacteremia- had L TMA 4/15 for this on last admit, TEE was neg for vegetation, on IV Vancomycin through 05/28/13 per ID 4. ESRD - off schedule TTS for now this week 5. HTN/volume - SBPs 156F-537H, off Bystolic now, no vol excess, new dry wt ~72kg 6. Anemia - Hgb 9.6. Aranesp 100 q Tues. Follow labs. Will likely need op ESAs at d/c 7. Sec HPT - Ca 9.2 (10.6 corrected),  Hypercalcemia improving. Continue 2Ca bath and hold Vit D. Phos up.  8. Malnutrition - Alb 2.3. On clears, multivitamin, supplement.  Can address appetite once #1 resolved. 9. DM - insulin per primary. 10. Hx Multiple myeloma - Next f/u with Onc is in June Dispo - Refused SNF last admit. Per primary, will recommend again due to FTT and safety issues.   Plan- HD today  [name] MD (pgr) [phone]    ([phone] 05/06/2013, 11:44 AM         Additional Objective Labs: Basic Metabolic Panel:  Recent Labs Lab 04/30/13 1000  05/03/13 0334 05/04/13 0847  05/05/13 0627  NA 136*  < > 142 142 139  K 4.1  < > 4.1 3.9 3.6*  CL 92*  < > 95* 92* 93*  CO2 23  < > '24 23 24  ' GLUCOSE 148*  < > 123* 149* 125*  BUN 38*  < > 40* 53* 24*  CREATININE 7.94*  < > 8.46* 10.40* 5.84*  CALCIUM 10.1  < > 9.8 9.8 9.2  PHOS 6.3*  --   --  7.0*  --   < > = values in this interval not displayed. Liver Function Tests:  Recent Labs Lab 05/03/13 0334 05/04/13 0847 05/05/13 0627  AST '9 10 13  ' ALT <5 <5 7  ALKPHOS 89 85 88  BILITOT 0.8 0.7 0.7  PROT 8.0 8.3 8.2  ALBUMIN 2.2* 2.4* 2.3*    Recent Labs Lab 05/01/13 1921  LIPASE 7*   CBC:  Recent Labs Lab 05/01/13 1921 05/02/13 0454 05/03/13 0334 05/04/13 0404 05/04/13 0847 05/05/13 0627  WBC 23.9* 22.1* 20.4* 15.6* 15.0* 13.6*  NEUTROABS 21.1*  --   --   --  12.5* 11.0*  HGB 11.0* 10.0* 9.5* 9.8* 9.3* 9.6*  HCT 35.2* 32.8* 31.2* 32.0* 30.3* 32.1*  MCV 86.1 86.5 86.4 85.6 85.4 87.2  PLT 234 251 238 262 277 280   Blood Culture  Component Value Date/Time   SDES BLOOD RIGHT HAND 05/01/2013 2100   SPECREQUEST BOTTLES DRAWN AEROBIC ONLY [facility] 05/01/2013 2100   CULT  Value:        BLOOD CULTURE RECEIVED NO GROWTH TO DATE CULTURE WILL BE HELD FOR 5 DAYS BEFORE ISSUING A FINAL NEGATIVE REPORT Performed at [place] 05/01/2013 2100   REPTSTATUS PENDING 05/01/2013 2100    CBG:  Recent Labs Lab 05/05/13 1200 05/05/13 1708 05/05/13 2046 05/06/13 0934 05/06/13 1134  GLUCAP 172* 86 109* 127* 165*    Studies/Results: Dg Abd Acute W/chest  05/05/2013   CLINICAL DATA:  Vomiting and abdominal pain  EXAM: ACUTE ABDOMEN SERIES (ABDOMEN 2 VIEW & CHEST 1 VIEW)  COMPARISON:  05/01/2013, 02/24/2013  FINDINGS: Cardiac shadow is stable. There is elevation of the right hemidiaphragm again seen. No focal confluent infiltrate is seen.  Scattered large and small bowel gas is identified. Radiodense material is noted within the colon related to ingested material. A bladder calculus is again seen  degenerative changes of the bony skeleton are noted. No free air is noted.  IMPRESSION: No acute abnormality is identified.   Electronically Signed   By: Inez Catalina M.D.   On: 05/05/2013 13:40   Medications:   . aspirin EC  81 mg Oral Daily  . collagenase   Topical Daily  . darbepoetin (ARANESP) injection - DIALYSIS  100 mcg Intravenous Q Tue-HD  . enoxaparin (LOVENOX) injection  30 mg Subcutaneous Daily  . feeding supplement (RESOURCE BREEZE)  1 Container Oral TID BM  . ferric gluconate (FERRLECIT/NULECIT) IV  125 mg Intravenous Q T,Th,Sa-HD  . insulin aspart  0-9 Units Subcutaneous TID WC  . lanthanum  1,000 mg Oral TID WC  . levothyroxine  175 mcg Oral QAC breakfast  . multivitamin  1 tablet Oral QHS  . vancomycin  1,000 mg Intravenous Q T,Th,Sa-HD

## 2013-05-06 NOTE — Progress Notes (Signed)
ANTIBIOTIC CONSULT NOTE  Pharmacy Consult for Vanco Indication: Cellulitis, r/o bacteremia  Allergies  Allergen Reactions  . Bee Venom Anaphylaxis  . Lisinopril Cough  . Ivp Dye [Iodinated Diagnostic Agents] Hives  . Morphine And Related Other (See Comments)    Bradycardia states patient    Patient Measurements: Height: 5\' 10"  (177.8 cm) Weight: 159 lb 2.8 oz (72.2 kg) IBW/kg (Calculated) : 73  Vital Signs: Temp: 98.2 F (36.8 C) (04/23 1253) Temp src: Oral (04/23 1253) BP: 122/73 mmHg (04/23 1430) Pulse Rate: 84 (04/23 1430) Intake/Output from previous day: 04/22 0701 - 04/23 0700 In: 430 [P.O.:120; IV Piggyback:310] Out: -  Intake/Output from this shift: Total I/O In: 240 [P.O.:240] Out: 1 [Stool:1]  Labs:  Recent Labs  05/04/13 0847 05/05/13 0627 05/06/13 1257  WBC 15.0* 13.6* 11.4*  HGB 9.3* 9.6* 9.2*  PLT 277 280 312  CREATININE 10.40* 5.84* 8.03*   Estimated Creatinine Clearance: 8.9 ml/min (by C-G formula based on Cr of 8.03).  Recent Labs  05/06/13 1211  VANCORANDOM 21.0     Microbiology: Recent Results (from the past 720 hour(s))  SURGICAL PCR SCREEN     Status: None   Collection Time    04/17/13  5:10 AM      Result Value Ref Range Status   MRSA, PCR NEGATIVE  NEGATIVE Final   Staphylococcus aureus NEGATIVE  NEGATIVE Final   Comment:            The Xpert SA Assay (FDA     approved for NASAL specimens     in patients over 26 years of age),     is one component of     a comprehensive surveillance     program.  Test performance has     been validated by [employer] for patients greater     than or equal to 7 year old.     It is not intended     to diagnose infection nor to     guide or monitor treatment.  CULTURE, BLOOD (ROUTINE X 2)     Status: None   Collection Time    04/17/13  5:48 AM      Result Value Ref Range Status   Specimen Description BLOOD RIGHT HAND   Final   Special Requests BOTTLES DRAWN AEROBIC ONLY 5CC    Final   Culture  Setup Time     Final   Value: 04/17/2013 12:44     Performed at [facility]   Culture     Final   Value: METHICILLIN RESISTANT STAPHYLOCOCCUS AUREUS     Note: RIFAMPIN AND GENTAMICIN SHOULD NOT BE USED AS SINGLE DRUGS FOR TREATMENT OF STAPH INFECTIONS. This organism DOES NOT demonstrate inducible Clindamycin resistance in vitro. CRITICAL RESULT CALLED TO, READ BACK BY AND VERIFIED WITH: JAMIE COVINGTON      04/19/13 1437 BY SMITHERSJ     Note: Gram Stain Report Called to,Read Back By and Verified With: Virgilio Frees 04/18/12 @ 10:45AM BY RUSCOE A.     Performed at [facility]   Report Status 04/20/2013 FINAL   Final   Organism ID, Bacteria METHICILLIN RESISTANT STAPHYLOCOCCUS AUREUS   Final  CULTURE, BLOOD (ROUTINE X 2)     Status: None   Collection Time    04/17/13  5:50 AM      Result Value Ref Range Status   Specimen Description BLOOD RIGHT HAND   Final   Special Requests  Final   Value: BOTTLES DRAWN AEROBIC AND ANAEROBIC 10CC BLUE,5CC RED   Culture  Setup Time     Final   Value: 04/17/2013 12:44     Performed at [facility]   Culture     Final   Value: STAPHYLOCOCCUS AUREUS     Note: SUSCEPTIBILITIES PERFORMED ON PREVIOUS CULTURE WITHIN THE LAST 5 DAYS.     Note: Gram Stain Report Called to,Read Back By and Verified With: Earleen Reaper RN on 04/18/13 at 05:50 by Rise Mu     Performed at [facility]   Report Status 04/20/2013 FINAL   Final  CULTURE, BLOOD (ROUTINE X 2)     Status: None   Collection Time    04/19/13  1:04 PM      Result Value Ref Range Status   Specimen Description BLOOD RIGHT HAND   Final   Special Requests BOTTLES DRAWN AEROBIC ONLY [facility]   Final   Culture  Setup Time     Final   Value: 04/19/2013 16:06     Performed at [facility]   Culture     Final   Value: NO GROWTH 5 DAYS     Performed at [facility]   Report Status 04/25/2013 FINAL   Final  CULTURE, BLOOD (ROUTINE X 2)      Status: None   Collection Time    04/19/13  1:10 PM      Result Value Ref Range Status   Specimen Description BLOOD RIGHT ANTECUBITAL   Final   Special Requests BOTTLES DRAWN AEROBIC ONLY 5CC   Final   Culture  Setup Time     Final   Value: 04/19/2013 16:06     Performed at [facility]   Culture     Final   Value: NO GROWTH 5 DAYS     Performed at [facility]   Report Status 04/25/2013 FINAL   Final  WOUND CULTURE     Status: None   Collection Time    04/22/13 10:37 AM      Result Value Ref Range Status   Specimen Description WOUND LEFT FOOT   Final   Special Requests LEFT FIRST TOE PT ON VANCO VANCO   Final   Gram Stain     Final   Value: FEW WBC PRESENT, PREDOMINANTLY PMN     FEW SQUAMOUS EPITHELIAL CELLS PRESENT     MODERATE GRAM POSITIVE COCCI IN CLUSTERS     MODERATE GRAM POSITIVE RODS     Performed at [facility]   Culture     Final   Value: ABUNDANT METHICILLIN RESISTANT STAPHYLOCOCCUS AUREUS     Note: RIFAMPIN AND GENTAMICIN SHOULD NOT BE USED AS SINGLE DRUGS FOR TREATMENT OF STAPH INFECTIONS. This organism DOES NOT demonstrate inducible Clindamycin resistance in vitro. CRITICAL RESULT CALLED TO, READ BACK BY AND VERIFIED WITH: ALBERT R @ 7:40AM      04/25/13 BY DWEEKS     Performed at [facility]   Report Status 04/25/2013 FINAL   Final   Organism ID, Bacteria METHICILLIN RESISTANT STAPHYLOCOCCUS AUREUS   Final  CLOSTRIDIUM DIFFICILE BY PCR     Status: None   Collection Time    04/24/13 12:25 AM      Result Value Ref Range Status   C difficile by pcr NEGATIVE  NEGATIVE Final  CULTURE, BLOOD (ROUTINE X 2)     Status: None   Collection Time    05/01/13  8:50 PM      Result Value Ref Range Status   Specimen Description BLOOD RIGHT ARM   Final   Special Requests BOTTLES DRAWN AEROBIC AND ANAEROBIC 10CC EA   Final   Culture  Setup Time     Final   Value: 05/02/2013 02:24     Performed at [facility]   Culture     Final    Value:        BLOOD CULTURE RECEIVED NO GROWTH TO DATE CULTURE WILL BE HELD FOR 5 DAYS BEFORE ISSUING A FINAL NEGATIVE REPORT     Performed at [facility]   Report Status PENDING   Incomplete  CULTURE, BLOOD (ROUTINE X 2)     Status: None   Collection Time    05/01/13  9:00 PM      Result Value Ref Range Status   Specimen Description BLOOD RIGHT HAND   Final   Special Requests BOTTLES DRAWN AEROBIC ONLY [facility]   Final   Culture  Setup Time     Final   Value: 05/02/2013 02:24     Performed at [facility]   Culture     Final   Value:        BLOOD CULTURE RECEIVED NO GROWTH TO DATE CULTURE WILL BE HELD FOR 5 DAYS BEFORE ISSUING A FINAL NEGATIVE REPORT     Performed at [facility]   Report Status PENDING   Incomplete  MRSA PCR SCREENING     Status: None   Collection Time    05/02/13  1:08 AM      Result Value Ref Range Status   MRSA by PCR NEGATIVE  NEGATIVE Final   Comment:            The GeneXpert MRSA Assay (FDA     approved for NASAL specimens     only), is one component of a     comprehensive MRSA colonization     surveillance program. It is not     intended to diagnose MRSA     infection nor to guide or     monitor treatment for     MRSA infections.    Assessment:  68 y/o M with recent discharge returned with gangrenous foot s/p toe amputation on the left side, with continued nausea, vomiting, diarrhea, weakness, inability to ambulate and "coldness." Patient continues on vancomycin.  He did not have HD on Mon 4/20 and schedule changed to TThS- to go for HD today. Pre-HD vancomycin level drawn this afternoon was therapeutic at 32mcg/mL. Blood cultures are NGTD, WBC down to 11.4, afebrile.  Goal of Therapy:  Pre-HD vancomycin level 15-68mcg/mL  Plan:  1. Continue Vancomycin 1g IV TThS with HD 2. Follow for any changes to HD schedule and need for repeat level  Zanobia Griebel D. Kaymon Denomme, PharmD, BCPS Clinical Pharmacist Pager: [phone] 05/06/2013 3:02 PM

## 2013-05-06 NOTE — Clinical Social Work Psychosocial (Signed)
Clinical Social Work Department BRIEF PSYCHOSOCIAL ASSESSMENT 05/06/2013  Patient:  Jack [name], Jack [name]     Account Number:  [id number]     Admit date:  05/01/2013  Clinical Social Worker:  Frederico Hamman  Date/Time:  05/06/2013 05:46 AM  Referred by:  Physician  Date Referred:  05/03/2013 Referred for  SNF Placement   Other Referral:   Interview type:  Patient Other interview type:   CSW talked with patient and daughter [name] in the patient's room. CSW talked by phone with patient's daughter [name] [phone]).    PSYCHOSOCIAL DATA Living Status:  FAMILY Admitted from facility:   Level of care:   Primary support name:  Noriel Guthrie Primary support relationship to patient:  CHILD, ADULT Degree of support available:   Daughters [name], [place] and [name] are very active with patient's care and HD transportation. Daughter [name] was attempting to get patient to eat when CSW entered, however he refused. Per [name], her sister [name] handles most of patient's business, including selecting SNF facility.    CURRENT CONCERNS Current Concerns  Post-Acute Placement   Other Concerns:    SOCIAL WORK ASSESSMENT / PLAN CSW visited room and spoke with patient and daughter [name], who was at the bedside and attempting to get patient to eat some applesauce. Patient swatted daughter's hand away and stated that he did not want the applesauce she was trying to feed him. Mr. Seese remembered CSW from previous hospitalization after CSW recounted visit.    Daughters in agreement with rehab and [name] reported that when he last came home they had a difficult time lifting and caring for patient, and they had decided that if he came back to hospital, he would go to rehab. CSW asked patient and he is also in agreement, although  reluctantly. CSW gave [name] the SNF list for [facility] and explained the facility search process. Cori and patient mentioned [facility] and patient recounted that  he knows someone that went there and liked the facility. Cori informed CSW that her sister [name] would get list and talk further with CSW about facility choice.   Assessment/plan status:  Psychosocial Support/Ongoing Assessment of Needs Other assessment/ plan:   Information/referral to community resources:   SNF list for [facility]    PATIENT'S/FAMILY'S RESPONSE TO PLAN OF CARE: Patient pleasant with CSW although he was irritated with his daughter for trying to feed him. Daughter very pleasant and receptive to talking with CSW. [name] stated that she is the youngest and [name] makes most decisions for their dad.

## 2013-05-06 NOTE — ED Provider Notes (Signed)
I saw and evaluated the patient, reviewed the resident's note and I agree with the findings and plan.   EKG Interpretation   Date/Time:  Saturday May 01 2013 17:52:40 EDT Ventricular Rate:  77 PR Interval:  224 QRS Duration: 90 QT Interval:  453 QTC Calculation: 513 R Axis:   -49 Text Interpretation:  Sinus or ectopic atrial rhythm Prolonged PR interval  Left anterior fascicular block Anterior Q waves, possibly due to LVH LVH  w/ repol abnormalities, possible ischemia Prolonged QT interval ED  PHYSICIAN INTERPRETATION AVAILABLE IN [facility] Confirmed by TEST,  Record ([id number]) on 05/03/2013 7:16:21 AM     Suspect cellulitis possible sepsis. CRITICAL CARE Performed by: Babette Relic Total critical care time: 35min. Critical care time was exclusive of separately billable procedures and treating other patients. Critical care was necessary to treat or prevent imminent or life-threatening deterioration. Critical care was time spent personally by me on the following activities: development of treatment plan with patient and/or surrogate as well as nursing, discussions with consultants, evaluation of patient's response to treatment, examination of patient, obtaining history from patient or surrogate, ordering and performing treatments and interventions, ordering and review of laboratory studies, ordering and review of radiographic studies, pulse oximetry and re-evaluation of patient's condition.  Babette Relic, MD 05/06/13 [phone]

## 2013-05-06 NOTE — Progress Notes (Signed)
Patient Demographics  Jack [name], is a 68 y.o. male, DOB - Dec 07, 1945, YOV:[id number]  Admit date - 05/01/2013   Admitting Physician Jack Krystal Eaton, MD  Outpatient Primary MD for the patient is Jack Peers, MD  LOS - 5   Chief Complaint  Patient presents with  . Nausea  . Emesis  . Diarrhea       Brief Narrative:    68 year old BM PMHx diabetes, hypertension, hypothyroidism, ESRD on HD MWF, MRSA bacteremia, left foot trans metatarsal amputation on 4/13 for gangrene left foot by Jack [name] who was just discharged on 04/30/13 and was brought back to the ER by his daughter who reported that home health nurse came to the house and checked his vitals. His BP was low 80s, he was lethargic, and the RN then recommended that they take him to the ER. Patient reported persistent nausea, 2 episodes of vomiting and one episode of diarrhea, with generalized weakness since discharge. During his prior admit the patient was offered SNF placement for rehab, however he refused. Patient's daughter reported that she works during the day and her uncle who has been staying with them watches him. But patient has not done much since going home and has not been eating well. She strongly feels that he needs rehabilitation to get stronger.     Assessment & Plan    Hypotension  Likely due primarily to hypovolemia due to increased loss (diarrhea) with diminished intake - soft with gentle hydration.     N/V - Diarrhea  C diff negative during prior admit - recheck pending, diarrhea has resolved clinically unlikely he has C. difficile. Most likely had gastroenteritis. His abdominal x-ray is unremarkable and he is much improved and symptom free this morning, will advance diet.    Failure to thrive  Pt was previously  advised that he needed SNF as it was not felt he would be safe at home - he refused and was therefore d/c home - he did not do well at home and returned to the ER ~24hrs later  - SNF placement; counseled patient and wife would need to be discharged to SNF, both agreed. Social work consulted.     MRSA bacteremia  -Previously diagnosed - NOT a new finding,  cont Vanc supposed to get total of 8 weeks start date appears to be 04/17/2013 according to the last ID noted. Posterior follow with Jack [name] last week of April or first week of June. Repeat cultures this admission are negative.     Diabetic toe gangrene / Wound infection after surgery / s/p TMA  -Cont Vanc, continue W Care,  f/u w/ Jack [name] as previously prescribed unless acute complication encountered       Leukocytosis  -Continue to monitor; has  begun to trend down, negative left shift or bands, patient has been afebrile.    Diabetes mellitus type 1 with renal and vascular complications  CBG reasonably controlled at present; no change today in regimen   CBG (last 3)   Recent Labs  05/05/13 1708 05/05/13 2046 05/06/13 0934  GLUCAP 86 109* 127*     Lab Results  Component Value Date   HGBA1C 6.5* 05/02/2013     Anemia in chronic kidney disease  Hgb appears to be stable     End stage renal disease on HD (T/Th/Sat)  -Nephrology following , TTS dialysis -Patient with left AV fistula     Hypothyroid  Cont home synthroid dose    Peripheral vascular disease   As above    Hx of multiple myeloma   Out patient follow with primary hematologist oncologist post discharge.     Code Status: full  Family Communication: daughter  Disposition Plan: SNF   Procedures   Last recent admission    3/25 - L first toe amputation per Dr. Kellie [name]  4/3 - admit for post-op wound infection w/ MRSA bacteremia (wound and blood cx +) - plan for 8 weeks IV vanc per ID  4/7 - TEE - no evidence of vegetation -  ejection fraction of 65%  4/9 - I&D left first toe amputation wound  4/13 - L TMA per Jack [name]  4/17 - d/c home after pt refuses SNF recommendation    4/18 - returned to ER - This admission    Consults   Renal   Medications  Scheduled Meds: . aspirin EC  81 mg Oral Daily  . collagenase   Topical Daily  . darbepoetin (ARANESP) injection - DIALYSIS  100 mcg Intravenous Q Tue-HD  . enoxaparin (LOVENOX) injection  30 mg Subcutaneous Daily  . feeding supplement (RESOURCE BREEZE)  1 Container Oral TID BM  . ferric gluconate (FERRLECIT/NULECIT) IV  125 mg Intravenous Q T,Th,Sa-HD  . insulin aspart  0-9 Units Subcutaneous TID WC  . lanthanum  1,000 mg Oral TID WC  . levothyroxine  175 mcg Oral QAC breakfast  . multivitamin  1 tablet Oral QHS  . vancomycin  1,000 mg Intravenous Q T,Th,Sa-HD   Continuous Infusions:  PRN Meds:.acetaminophen, acetaminophen, loperamide, ondansetron (ZOFRAN) IV, ondansetron, oxyCODONE  DVT Prophylaxis  Lovenox    Lab Results  Component Value Date   PLT 280 05/05/2013    Antibiotics   Anti-infectives   Start     Dose/Rate Route Frequency Ordered Stop   05/04/13 1200  vancomycin (VANCOCIN) IVPB 1000 mg/200 mL premix    Comments:  Will dialyze on TTS schedule this admit   1,000 mg 200 mL/hr over 60 Minutes Intravenous Every T-Th-Sa (Hemodialysis) 05/03/13 1407     05/03/13 1200  vancomycin (VANCOCIN) IVPB 1000 mg/200 mL premix  Status:  Discontinued     1,000 mg 200 mL/hr over 60 Minutes Intravenous Every M-W-F (Hemodialysis) 05/01/13 2116 05/03/13 1407   05/02/13 0600  piperacillin-tazobactam (ZOSYN) IVPB 2.25 g  Status:  Discontinued     2.25 g 100 mL/hr over 30 Minutes Intravenous 3 times per day 05/01/13 2117 05/02/13 1053   05/01/13 2200  piperacillin-tazobactam (ZOSYN) IVPB 2.25 g  Status:  Discontinued     2.25 g 100 mL/hr over 30 Minutes Intravenous 3 times per day 05/01/13 2116 05/01/13 2117   05/01/13 2100  piperacillin-tazobactam  (ZOSYN) IVPB 3.375 g     3.375 g 100 mL/hr over 30 Minutes Intravenous  Once 05/01/13 2058 05/01/13 2140   05/01/13 2100  vancomycin (VANCOCIN) IVPB 1000 mg/200 mL premix  Status:  Discontinued     1,000 mg 200 mL/hr over 60 Minutes Intravenous  Once 05/01/13 2058 05/01/13 2117          Subjective:   Jack [name] today has, No headache, No chest pain, No abdominal pain - No Nausea, No new weakness tingling or numbness, No Cough - SOB.    Objective:  Filed Vitals:   05/05/13 1710 05/05/13 2052 05/06/13 0543 05/06/13 0845  BP: 123/71 122/73 130/86 113/71  Pulse: 78 76 75 87  Temp: 98.4 F (36.9 C) 99.1 F (37.3 C) 98.8 F (37.1 C) 98 F (36.7 C)  TempSrc: Oral Oral Oral Oral  Resp: $Remo'18 18 20 20  'CuJWd$ Height:  $Remove'5\' 10"'WosCnhc$  (1.778 m)    Weight:  72.122 kg (159 lb)    SpO2: 100% 98% 98% 98%    Wt Readings from Last 3 Encounters:  05/05/13 72.122 kg (159 lb)  04/30/13 72.3 kg (159 lb 6.3 oz)  04/30/13 72.3 kg (159 lb 6.3 oz)     Intake/Output Summary (Last 24 hours) at 05/06/13 1118 Last data filed at 05/06/13 0855  Gross per 24 hour  Intake    670 ml  Output      0 ml  Net    670 ml     Physical Exam  Awake Alert, Oriented X 3, No new F.N deficits, Normal affect [place].AT,PERRAL Supple Neck,No JVD, No cervical lymphadenopathy appriciated.  Symmetrical Chest wall movement, Good air movement bilaterally, CTAB RRR,No Gallops,Rubs or new Murmurs, No Parasternal Heave +ve B.Sounds, Abd Soft, Non tender, No organomegaly appriciated, No rebound - guarding or rigidity. No Cyanosis, Clubbing or edema, No new Rash or bruise , Left AV fistula present, L foot in bandage    Data Review   Micro Results Recent Results (from the past 240 hour(s))  CULTURE, BLOOD (ROUTINE X 2)     Status: None   Collection Time    05/01/13  8:50 PM      Result Value Ref Range Status   Specimen Description BLOOD RIGHT ARM   Final   Special Requests BOTTLES DRAWN AEROBIC AND ANAEROBIC 10CC EA    Final   Culture  Setup Time     Final   Value: 05/02/2013 02:24     Performed at [facility]   Culture     Final   Value:        BLOOD CULTURE RECEIVED NO GROWTH TO DATE CULTURE WILL BE HELD FOR 5 DAYS BEFORE ISSUING A FINAL NEGATIVE REPORT     Performed at [facility]   Report Status PENDING   Incomplete  CULTURE, BLOOD (ROUTINE X 2)     Status: None   Collection Time    05/01/13  9:00 PM      Result Value Ref Range Status   Specimen Description BLOOD RIGHT HAND   Final   Special Requests BOTTLES DRAWN AEROBIC ONLY [facility]   Final   Culture  Setup Time     Final   Value: 05/02/2013 02:24     Performed at [facility]   Culture     Final   Value:        BLOOD CULTURE RECEIVED NO GROWTH TO DATE CULTURE WILL BE HELD FOR 5 DAYS BEFORE ISSUING A FINAL NEGATIVE REPORT     Performed at [facility]   Report Status PENDING   Incomplete  MRSA PCR SCREENING     Status: None   Collection Time    05/02/13  1:08 AM      Result Value Ref Range Status   MRSA by PCR NEGATIVE  NEGATIVE Final   Comment:            The GeneXpert MRSA Assay (FDA     approved for NASAL specimens     only), is one component of a  comprehensive MRSA colonization     surveillance program. It is not     intended to diagnose MRSA     infection nor to guide or     monitor treatment for     MRSA infections.    Radiology Reports Dg Chest 2 View  05/01/2013   CLINICAL DATA:  Shortness of breath.  Asthma.  Nausea and vomiting.  EXAM: CHEST  2 VIEW  COMPARISON:  02/23/2013  FINDINGS: Mildly improved aeration of both lungs is noted. Elevation of right hemidiaphragm again seen. No evidence of pulmonary infiltrate or edema. No evidence of pleural effusion. Heart size and mediastinal contours are stable.  IMPRESSION: Stable elevation of right hemidiaphragm.  No active disease.   Electronically Signed   By: Earle Gell M.D.   On: 05/01/2013 21:34       CBC  Recent Labs Lab 05/01/13 1921  05/02/13 0454 05/03/13 0334 05/04/13 0404 05/04/13 0847 05/05/13 0627  WBC 23.9* 22.1* 20.4* 15.6* 15.0* 13.6*  HGB 11.0* 10.0* 9.5* 9.8* 9.3* 9.6*  HCT 35.2* 32.8* 31.2* 32.0* 30.3* 32.1*  PLT 234 251 238 262 277 280  MCV 86.1 86.5 86.4 85.6 85.4 87.2  MCH 26.9 26.4 26.3 26.2 26.2 26.1  MCHC 31.3 30.5 30.4 30.6 30.7 29.9*  RDW 15.0 15.1 15.0 15.0 14.8 15.0  LYMPHSABS 1.4  --   --   --  1.3 1.3  MONOABS 1.4*  --   --   --  1.1* 1.2*  EOSABS 0.0  --   --   --  0.1 0.1  BASOSABS 0.0  --   --   --  0.0 0.0    Chemistries   Recent Labs Lab 05/01/13 1921 05/02/13 0454 05/03/13 0334 05/04/13 0847 05/05/13 0627  NA 138 140 142 142 139  K 3.8 3.8 4.1 3.9 3.6*  CL 91* 95* 95* 92* 93*  CO2 $Re'28 25 24 23 24  'xIn$ GLUCOSE 170* 147* 123* 149* 125*  BUN 26* 29* 40* 53* 24*  CREATININE 5.95* 6.35* 8.46* 10.40* 5.84*  CALCIUM 10.3 10.2 9.8 9.8 9.2  MG  --   --   --  2.3  --   AST 12  --  $R'9 10 13  'uW$ ALT 6  --  <5 <5 7  ALKPHOS 102  --  89 85 88  BILITOT 0.7  --  0.8 0.7 0.7   ------------------------------------------------------------------------------------------------------------------ estimated creatinine clearance is 12.2 ml/min (by C-G formula based on Cr of 5.84). ------------------------------------------------------------------------------------------------------------------ No results found for this basename: HGBA1C,  in the last 72 hours ------------------------------------------------------------------------------------------------------------------ No results found for this basename: CHOL, HDL, LDLCALC, TRIG, CHOLHDL, LDLDIRECT,  in the last 72 hours ------------------------------------------------------------------------------------------------------------------ No results found for this basename: TSH, T4TOTAL, FREET3, T3FREE, THYROIDAB,  in the last 72 hours ------------------------------------------------------------------------------------------------------------------ No  results found for this basename: VITAMINB12, FOLATE, FERRITIN, TIBC, IRON, RETICCTPCT,  in the last 72 hours  Coagulation profile No results found for this basename: INR, PROTIME,  in the last 168 hours  No results found for this basename: DDIMER,  in the last 72 hours  Cardiac Enzymes No results found for this basename: CK, CKMB, TROPONINI, MYOGLOBIN,  in the last 168 hours ------------------------------------------------------------------------------------------------------------------ No components found with this basename: POCBNP,      Time Spent in minutes   35   Thurnell Lose M.D on 05/06/2013 at 11:18 AM  Between 7am to 7pm - Pager - [phone]  After 7pm go to [email or web address] - password [email or web address]  And look for  the night coverage person covering for me after hours  [facility] Office  [phone]

## 2013-05-07 LAB — CBC
HEMATOCRIT: 31.2 % — AB (ref 39.0–52.0)
HEMOGLOBIN: 9.5 g/dL — AB (ref 13.0–17.0)
MCH: 26.5 pg (ref 26.0–34.0)
MCHC: 30.4 g/dL (ref 30.0–36.0)
MCV: 87.2 fL (ref 78.0–100.0)
Platelets: 335 10*3/uL (ref 150–400)
RBC: 3.58 MIL/uL — AB (ref 4.22–5.81)
RDW: 14.7 % (ref 11.5–15.5)
WBC: 11.1 10*3/uL — ABNORMAL HIGH (ref 4.0–10.5)

## 2013-05-07 LAB — GLUCOSE, CAPILLARY: Glucose-Capillary: 138 mg/dL — ABNORMAL HIGH (ref 70–99)

## 2013-05-07 LAB — BASIC METABOLIC PANEL
BUN: 18 mg/dL (ref 6–23)
CO2: 27 meq/L (ref 19–32)
Calcium: 9.6 mg/dL (ref 8.4–10.5)
Chloride: 96 mEq/L (ref 96–112)
Creatinine, Ser: 5.15 mg/dL — ABNORMAL HIGH (ref 0.50–1.35)
GFR calc Af Amer: 12 mL/min — ABNORMAL LOW (ref >90)
GFR calc non Af Amer: 10 mL/min — ABNORMAL LOW (ref >90)
GLUCOSE: 151 mg/dL — AB (ref 70–99)
POTASSIUM: 4.1 meq/L (ref 3.7–5.3)
SODIUM: 139 meq/L (ref 137–147)

## 2013-05-07 MED ORDER — VANCOMYCIN HCL 500 MG IV SOLR
500.0000 mg | INTRAVENOUS | Status: AC
  Administered 2013-05-07: 500 mg via INTRAVENOUS
  Filled 2013-05-07: qty 500

## 2013-05-07 MED ORDER — BOOST / RESOURCE BREEZE PO LIQD: 1.0000 | Freq: Three times a day (TID) | ORAL | Status: DC

## 2013-05-07 MED ORDER — OXYCODONE HCL 5 MG PO TABS: 5.0000 mg | ORAL_TABLET | Freq: Four times a day (QID) | ORAL | Status: DC | PRN

## 2013-05-07 MED ORDER — VANCOMYCIN HCL IN DEXTROSE 1-5 GM/200ML-% IV SOLN: 1000.0000 mg | INTRAVENOUS | Status: DC

## 2013-05-07 NOTE — Progress Notes (Signed)
Patient was discharged to [facility]. Patients daughters were notified and at bedside at discharge. Patient was discharged by ambulance. Patients belongings were taken by daughters. Patient was stable upon discharge.

## 2013-05-07 NOTE — Progress Notes (Signed)
Physical Therapy Treatment Patient Details Name: Jack [name] MRN: [id number] DOB: 06-Feb-1945 Today's Date: 05/07/2013    History of Present Illness Pt underwent Lt transmetatarsal amputation 04/26/13 due to extensive  gangrene of Lt. foot Pt is to be strict NWB of left LE; Pt went home after last admission despite PT recs for post-acute rehab, and now returns with N&V, hypovolemia, lethargy    PT Comments    Jack [name] was agreeable to working with PT on bed exercises but did not agree to attempt transfers.  He felt like I was "trying to trick" him with my request for practice of sit<>stand and possible transfers.  He was pleasant in completion of exercises.    Follow Up Recommendations  SNF     Equipment Recommendations  None recommended by PT    Recommendations for Other Services       Precautions / Restrictions Precautions Precautions: Fall Required Braces or Orthoses: Other Brace/Splint Other Brace/Splint: no darco shoes in room at this time Restrictions Weight Bearing Restrictions: Yes LLE Weight Bearing: Non weight bearing Other Position/Activity Restrictions: strict NWB LLE    Mobility  Bed Mobility Overal bed mobility: Needs Assistance Bed Mobility: Supine to Sit;Sit to Supine     Supine to sit: Min guard Sit to supine: Min guard   General bed mobility comments: cues for initiating task and for safety  Transfers Overall transfer level:  (pt. declined)                  Ambulation/Gait Ambulation/Gait assistance:  (pt. declined)               Stairs            Wheelchair Mobility    Modified Rankin (Stroke Patients Only)       Balance                                    Cognition Arousal/Alertness: Awake/alert Behavior During Therapy: Flat affect Overall Cognitive Status: Within Functional Limits for tasks assessed Area of Impairment: Attention;Memory;Safety/judgement;Awareness;Problem solving   Current  Attention Level: Focused Memory: Decreased short-term memory   Safety/Judgement: Decreased awareness of safety;Decreased awareness of deficits Awareness: Emergent Problem Solving: Slow processing;Decreased initiation      Exercises General Exercises - Lower Extremity Quad Sets: AROM;Both;10 reps;Supine Short Arc Quad: AROM;Both;10 reps;Supine Long Arc Quad: AROM;Both;10 reps;Seated Hip ABduction/ADduction: AROM;Both;10 reps Straight Leg Raises: AROM;Both;10 reps;Supine Hip Flexion/Marching: AROM;Both;10 reps    General Comments        Pertinent Vitals/Pain See vitals tab Pt. With c/o pain in left foot; RN informed him that his pain med is due at noon.      Home Living                      Prior Function            PT Goals (current goals can now be found in the care plan section) Progress towards PT goals: Not progressing toward goals - comment (pt. declined mobility but completed exercises in bed)    Frequency  Min 3X/week    PT Plan Current plan remains appropriate    Co-evaluation             End of Session   Activity Tolerance: Patient tolerated treatment well Patient left: in bed;with call bell/phone within reach;with bed alarm set     Time: [phone]  PT Time Calculation (min): 24 min  Charges:  $Therapeutic Exercise: 23-37 mins                    G Codes:      Jack [name] 05/07/2013, 12:20 PM Jack [name] PT Acute Rehab Services [phone] Bee[place] [phone]

## 2013-05-07 NOTE — Progress Notes (Signed)
Subjective:   No cos, daughter in room , tolerated hd yesterday, He agrees to rehab placement at [facility]  Objective Vital signs in last 24 hours: Filed Vitals:   05/06/13 1715 05/06/13 2109 05/07/13 0525 05/07/13 0740  BP: 119/72 100/64 118/77 145/66  Pulse: 83 83 86 84  Temp: 98.5 F (36.9 C) 98.6 F (37 C) 98.6 F (37 C) 98.1 F (36.7 C)  TempSrc: Oral Oral Oral Oral  Resp: '20 18 18 18  ' Height:      Weight: 71.2 kg (156 lb 15.5 oz) 72.3 kg (159 lb 6.3 oz)    SpO2: 98% 98% 100% 100%  Physical Exam  General: Alert, NAD Heart: RRR , soft 1/ rales, wheezes or rhonchi  Abdomen: Soft, NT, ND and + BS  Extremities: L transmet amp wrapped/dressingscant amount  old dried blood , Rt old transmet amp healed. No LE edema with  Dry flaking skin lower ext Dialysis Access: L AVF + bruit   Dialysis: [facility]  4h  400/800  85.5kg dry wt  Heparin 3500  LUA AVF   Assessment:  1. Diarrhea / nausea / vomiting- resolved, suspected viral gastroenteritis 2. Sacral decub- early stage, per WOC 3. L foot wound infection / MRSA bacteremia- had L TMA 4/15 for this on last admit, TEE was neg for vegetation, on IV Vancomycin through 05/28/13 per ID 4. ESRD - off schedule TTS for now 5. HTN/volume - am bp 145/66 and stable, now off Bystolic no vol excess  On exam with  new dry wt ~72kg 6. Anemia - Hgb 9.2 Aranesp 100 q Tues. Follow lab in am  Will  need op ESAs at d/c 7. Sec HPT - Ca 9.6 (10.9 corrected), Hypercalcemia improving with low ca  bath and holding  Vit D. Phos 7.0 >5.3 on fosrenol 8. Malnutrition - Alb 2.3. On  Solids now , multivitamin, supplement.  9. DM - insulin per primary. 10. Hx Multiple myeloma - Next f/u with Onc is in June 11. Dispo- going to SNF at [facility] .   Plan-  Short HD today to get back on schedule, possible D/C today per SW  Ernest Haber, PA-C [place] [phone] 05/07/2013,8:10 AM  LOS: 6 days   Pt seen, examined and agree w A/P as  above.  [name] MD pager [phone]    cell [phone] 05/07/2013, 11:34 AM    Labs: Basic Metabolic Panel:  Recent Labs Lab 04/30/13 1000  05/04/13 0847 05/05/13 0627 05/06/13 1257  NA 136*  < > 142 139 138  K 4.1  < > 3.9 3.6* 3.6*  CL 92*  < > 92* 93* 94*  CO2 23  < > '23 24 27  ' GLUCOSE 148*  < > 149* 125* 145*  BUN 38*  < > 53* 24* 36*  CREATININE 7.94*  < > 10.40* 5.84* 8.03*  CALCIUM 10.1  < > 9.8 9.2 9.6  PHOS 6.3*  --  7.0*  --  5.3*  < > = values in this interval not displayed. Liver Function Tests:  Recent Labs Lab 05/03/13 0334 05/04/13 0847 05/05/13 0627 05/06/13 1257  AST '9 10 13  ' --   ALT <5 <5 7  --   ALKPHOS 89 85 88  --   BILITOT 0.8 0.7 0.7  --   PROT 8.0 8.3 8.2  --   ALBUMIN 2.2* 2.4* 2.3* 2.3*    Recent Labs Lab 05/01/13 1921  LIPASE 7*    CBG:  Recent Labs Lab 05/05/13 1708 05/05/13 2046 05/06/13 0934 05/06/13 1134 05/06/13 2105  GLUCAP 86 109* 127* 165* 141*     Medications:   . aspirin EC  81 mg Oral Daily  . collagenase   Topical Daily  . darbepoetin (ARANESP) injection - DIALYSIS  100 mcg Intravenous Q Tue-HD  . enoxaparin (LOVENOX) injection  30 mg Subcutaneous Daily  . feeding supplement (RESOURCE BREEZE)  1 Container Oral TID BM  . ferric gluconate (FERRLECIT/NULECIT) IV  125 mg Intravenous Q T,Th,Sa-HD  . insulin aspart  0-9 Units Subcutaneous TID WC  . lanthanum  1,000 mg Oral TID WC  . levothyroxine  175 mcg Oral QAC breakfast  . multivitamin  1 tablet Oral QHS  . vancomycin  1,000 mg Intravenous Q T,Th,Sa-HD

## 2013-05-07 NOTE — Discharge Summary (Signed)
Jack [name], is a 68 y.o. male  DOB 04/02/1945  MRN [id number].  Admission date:  05/01/2013  Admitting Physician  Ripudeep Krystal Eaton, MD  Discharge Date:  05/07/2013   Primary MD  [name], MD  Recommendations for primary care physician for things to follow:   Repeat CBC BMP in 3-4 days, continue daily wound care for left foot stump.   Admission Diagnosis  End stage renal disease [585.6] CKD (chronic kidney disease) stage 5, GFR less than 15 ml/min [585.5] FTT (failure to thrive) in adult [783.7] Nausea vomiting and diarrhea [787.91, 787.01] Diabetes mellitus [250.00] Sepsis [038.9, 995.91]   Discharge Diagnosis  End stage renal disease [585.6] CKD (chronic kidney disease) stage 5, GFR less than 15 ml/min [585.5] FTT (failure to thrive) in adult [783.7] Nausea vomiting and diarrhea [787.91, 787.01] Diabetes mellitus [250.00] Sepsis [038.9, 995.91]    Principal Problem:   Nausea & vomiting Active Problems:   Diabetes mellitus type 1, controlled, with complications   End stage renal disease   Osteomyelitis of foot   MRSA bacteremia   Diarrhea   Sepsis   FTT (failure to thrive) in adult   Protein-calorie malnutrition, severe      Past Medical History  Diagnosis Date  . Hypertension   . Diabetes mellitus without complication   . Shortness of breath   . Pneumonia     2012  . Heart murmur   . Glaucoma   . Multiple myeloma, without mention of having achieved remission 03/30/2012    Cytogenetic neg on 03/23/2012.  . End stage renal disease on dialysis   . Asthma   . Hyperparathyroidism, secondary renal   . Peripheral arterial disease     Past Surgical History  Procedure Laterality Date  . Thyroidectomy    . Cervical disc surgery    . Eye surgery      CATARACTS  . Insertion of dialysis catheter Right  03/19/2012    Procedure: INSERTION OF DIALYSIS CATHETER;  Surgeon: Mal Misty, MD;  Location: [facility];  Service: Vascular;  Laterality: Right;  Right Internal Jugular  . Av fistula placement Left 03/25/2012    Procedure: ARTERIOVENOUS (AV) FISTULA CREATION;  Surgeon: Mal Misty, MD;  Location: [facility];  Service: Vascular;  Laterality: Left;  . Ligation of competing branches of arteriovenous fistula Left 05/08/2012    Procedure: LIGATION OF COMPETING BRANCHES OF ARTERIOVENOUS FISTULA;  Surgeon: Mal Misty, MD;  Location: [facility];  Service: Vascular;  Laterality: Left;  Ultrasound guided  . Cardiac catheterization      approx 30 years ago  . Amputation Right 11/10/2012    Procedure: AMPUTATION FIRST and SECOND TOES Right Foot;  Surgeon: Elam Dutch, MD;  Location: [facility] OR;  Service: Vascular;  Laterality: Right;  . Transmetatarsal amputation Left 12/16/2012    Procedure: TRANSMETATARSAL AMPUTATION AND VAC PLACEMENT;  Surgeon: Elam Dutch, MD;  Location: [place];  Service: Vascular;  Laterality: Left;  . Amputation Left 04/07/2013    Procedure: AMPUTATION  DIGIT- LEFT 1ST TOE;  Surgeon: Mal Misty, MD;  Location: [place];  Service: Vascular;  Laterality: Left;  . Tee without cardioversion N/A 04/20/2013    Procedure: TRANSESOPHAGEAL ECHOCARDIOGRAM (TEE);  Surgeon: Josue Hector, MD;  Location[facility];  Service: Cardiovascular;  Laterality: N/A;  . I&d extremity Left 04/22/2013    Procedure: [id number] HQI DEBRIDEMENT LEFT FIRST TOE AMPUTATION WOUND ;  Surgeon: Mal Misty, MD;  Location: [place];  Service: Vascular;  Laterality: Left;  . Amputation Left 04/26/2013    Procedure: Left Foot Transmetatarsal Amputation;  Surgeon: Newt Minion, MD;  Location: [place];  Service: Orthopedics;  Laterality: Left;  . Toe amputation      D/C 04-30-13     Discharge Condition: stable   Follow UP  Follow-up Information   Follow up with [name], MD. Schedule an appointment as soon as  possible for a visit in 1 week.   Specialty:  Family Medicine   Contact information:   [address] [place] [address] [phone]       Follow up with Alcide Evener, MD. Schedule an appointment as soon as possible for a visit in 1 week.   Specialty:  Infectious Diseases   Contact information:   [address] [place] [address] [phone]         Discharge Instructions  and  Discharge Medications      Discharge Orders   Future Appointments Provider Department Dept Phone   06/29/2013 9:00 AM [facility] [phone]   06/29/2013 9:30 AM [facility] (Chest) [facility] [phone]   07/06/2013 9:00 AM [name], MD [facility] [phone]   Future Orders Complete By Expires   Discharge instructions  As directed    Scheduling Instructions:   Follow with Primary MD [name], MD in 7 days   Get CBC, CMP, checked 7 days by Primary MD and again as instructed by your Primary MD. Get a 2 view Chest X ray done next visit if you had Pneumonia of Lung problems at the Hospital.   Activity: As tolerated with Full fall precautions use walker/cane & assistance as needed   Disposition SNF   Diet: Renal  Low carb, Check your Weight same time everyday, if you gain over 2 pounds, or you develop in leg swelling, experience more shortness of breath or chest pain, call your Primary MD immediately. Follow Cardiac Low Salt Diet and 1.2 lit/day fluid restriction.   On your next visit with her primary care physician please Get Medicines reviewed and adjusted.  Please request your Prim.MD to go over all Hospital Tests and Procedure/Radiological results at the follow up, please get all Hospital records sent to your Prim MD by signing hospital release before you go home.   If you experience worsening of your admission symptoms,  develop shortness of breath, life threatening emergency, suicidal or homicidal thoughts you must seek medical attention immediately by calling 911 or calling your MD immediately  if symptoms less severe.  You Must read complete instructions/literature along with all the possible adverse reactions/side effects for all the Medicines you take and that have been prescribed to you. Take any new Medicines after you have completely understood and accpet all the possible adverse reactions/side effects.   Do not drive and provide baby sitting services if your were admitted for syncope or siezures until  you have seen by Primary MD or a Neurologist and advised to do so again.  Do not drive when taking Pain medications.    Do not take more than prescribed Pain, Sleep and Anxiety Medications  Special Instructions: If you have smoked or chewed Tobacco  in the last 2 yrs please stop smoking, stop any regular Alcohol  and or any Recreational drug use.  Wear Seat belts while driving.   Please note  You were cared for by a hospitalist during your hospital stay. If you have any questions about your discharge medications or the care you received while you were in the hospital after you are discharged, you can call the unit and asked to speak with the hospitalist on call if the hospitalist that took care of you is not available. Once you are discharged, your primary care physician will handle any further medical issues. Please note that NO REFILLS for any discharge medications will be authorized once you are discharged, as it is imperative that you return to your primary care physician (or establish a relationship with a primary care physician if you do not have one) for your aftercare needs so that they can reassess your need for medications and monitor your lab values.   Increase activity slowly  As directed        Medication List         aspirin EC 81 MG tablet  Take 81 mg by mouth daily.     multivitamin  Tabs tablet  Take 1 tablet by mouth at bedtime.     b complex-vitamin c-folic acid 0.8 MG Tabs tablet  Take 1 tablet by mouth daily.     calcium acetate 667 MG capsule  Commonly known as:  PHOSLO  Take 1 capsule (667 mg total) by mouth 3 (three) times daily with meals.     colchicine 0.6 MG tablet  Take 0.5 tablets (0.3 mg total) by mouth daily.     doxazosin 2 MG tablet  Commonly known as:  CARDURA  Take 2 mg by mouth at bedtime.     doxercalciferol 4 MCG/2ML injection  Commonly known as:  HECTOROL  Inject 0.5 mLs (1 mcg total) into the vein every Monday, Wednesday, and Friday with hemodialysis.     feeding supplement (RESOURCE BREEZE) Liqd  Take 1 Container by mouth 3 (three) times daily between meals.     levothyroxine 175 MCG tablet  Commonly known as:  SYNTHROID, LEVOTHROID  Take 175 mcg by mouth daily before breakfast.     loperamide 2 MG capsule  Commonly known as:  IMODIUM  Take 1 capsule (2 mg total) by mouth as needed for diarrhea or loose stools.     nebivolol 5 MG tablet  Commonly known as:  BYSTOLIC  Take 5 mg by mouth at bedtime.     ONGLYZA 5 MG Tabs tablet  Generic drug:  saxagliptin HCl  Take 5 mg by mouth daily.     oxyCODONE 5 MG immediate release tablet  Commonly known as:  Oxy IR/ROXICODONE  Take 1 tablet (5 mg total) by mouth every 6 (six) hours as needed for severe pain.     sodium chloride 0.9 % SOLN 100 mL with ferric gluconate 12.5 MG/ML SOLN 125 mg  Inject 125 mg into the vein every Monday, Wednesday, and Friday with hemodialysis.     vancomycin 1 GM/200ML Soln  Commonly known as:  VANCOCIN  Inject 200 mLs (1,000 mg total) into the vein every Monday, Wednesday,  and Friday with hemodialysis. Will be stopped by infectious disease          Diet and Activity recommendation: See Discharge Instructions above   Consults obtained -    Major procedures and Radiology Reports - PLEASE review detailed and final reports for all details, in  brief -       Dg Chest 2 View  05/01/2013   CLINICAL DATA:  Shortness of breath.  Asthma.  Nausea and vomiting.  EXAM: CHEST  2 VIEW  COMPARISON:  02/23/2013  FINDINGS: Mildly improved aeration of both lungs is noted. Elevation of right hemidiaphragm again seen. No evidence of pulmonary infiltrate or edema. No evidence of pleural effusion. Heart size and mediastinal contours are stable.  IMPRESSION: Stable elevation of right hemidiaphragm.  No active disease.   Electronically Signed   By: Earle Gell M.D.   On: 05/01/2013 21:34      Dg Abd Acute W/chest  05/05/2013   CLINICAL DATA:  Vomiting and abdominal pain  EXAM: ACUTE ABDOMEN SERIES (ABDOMEN 2 VIEW & CHEST 1 VIEW)  COMPARISON:  05/01/2013, 02/24/2013  FINDINGS: Cardiac shadow is stable. There is elevation of the right hemidiaphragm again seen. No focal confluent infiltrate is seen.  Scattered large and small bowel gas is identified. Radiodense material is noted within the colon related to ingested material. A bladder calculus is again seen degenerative changes of the bony skeleton are noted. No free air is noted.  IMPRESSION: No acute abnormality is identified.   Electronically Signed   By: Inez Catalina M.D.   On: 05/05/2013 13:40    Micro Results      Recent Results (from the past 240 hour(s))  CULTURE, BLOOD (ROUTINE X 2)     Status: None   Collection Time    05/01/13  8:50 PM      Result Value Ref Range Status   Specimen Description BLOOD RIGHT ARM   Final   Special Requests BOTTLES DRAWN AEROBIC AND ANAEROBIC 10CC EA   Final   Culture  Setup Time     Final   Value: 05/02/2013 02:24     Performed at [facility]   Culture     Final   Value:        BLOOD CULTURE RECEIVED NO GROWTH TO DATE CULTURE WILL BE HELD FOR 5 DAYS BEFORE ISSUING A FINAL NEGATIVE REPORT     Performed at [facility]   Report Status PENDING   Incomplete  CULTURE, BLOOD (ROUTINE X 2)     Status: None   Collection Time    05/01/13  9:00 PM        Result Value Ref Range Status   Specimen Description BLOOD RIGHT HAND   Final   Special Requests BOTTLES DRAWN AEROBIC ONLY [facility]   Final   Culture  Setup Time     Final   Value: 05/02/2013 02:24     Performed at [facility]   Culture     Final   Value:        BLOOD CULTURE RECEIVED NO GROWTH TO DATE CULTURE WILL BE HELD FOR 5 DAYS BEFORE ISSUING A FINAL NEGATIVE REPORT     Performed at [facility]   Report Status PENDING   Incomplete  MRSA PCR SCREENING     Status: None   Collection Time    05/02/13  1:08 AM      Result Value Ref Range Status   MRSA by PCR NEGATIVE  NEGATIVE Final   Comment:            The GeneXpert MRSA Assay (FDA     approved for NASAL specimens     only), is one component of a     comprehensive MRSA colonization     surveillance program. It is not     intended to diagnose MRSA     infection nor to guide or     monitor treatment for     MRSA infections.     History of present illness and  Hospital Course:     Kindly see H&P for history of present illness and admission details, please review complete Labs, Consult reports and Test reports for all details in brief Jack [name], is a 68 y.o. male, patient with history of  diabetes, hypertension, hypothyroidism, ESRD on HD MWF, MRSA bacteremia, left foot trans metatarsal amputation on 4/13 for gangrene left foot by Dr. Sharol Given who was on 04/30/13 on prolonged IV vancomycin for bacteremia was brought to the ER by his daughter the next day of discharge for nausea vomiting secondary to gastroenteritis. Upon last discharge he refused SNF placement this time he has agreed for SNF placement.       Hypotension  Likely due primarily to hypovolemia due to increased loss (diarrhea) with diminished intake - soft with gentle hydration.      N/V - Diarrhea  Resolved consistent with viral gastric enteritis      Failure to thrive. Mod PCM  Continue protein supplementation, on previous  admission he had refused rehabilitation but now agrees. Will be discharged to rehabilitation.     MRSA bacteremia  Tinea on IV vancomycin total of 8 weeks of treatment, he needs to follow with Dr. Drucilla Schmidt within a week, ID we'll decide stop date for vancomycin     Diabetic toe gangrene / Wound infection after surgery / s/p TMA  -Cont Vanc, continue W Care, f/u w/ Dr. Sharol Given as previously prescribed unless acute complication encountered. But it will be stopped per ID, he needs to follow with them within a week.     Leukocytosis  -Continue to monitor; has begun to trend down, negative left shift or bands, patient has been afebrile.       Diabetes mellitus type 1 with renal and vascular complications  CBG reasonably controlled at present; he was on diet control for diabetes mellitus, continue to monitor CBGs if needed use sliding scale per rehabilitation protocol. CBG (last 3)   CBG (last 3)   Recent Labs  05/06/13 1134 05/06/13 2105 05/07/13 0717  GLUCAP 165* 141* 138*     Lab Results   Component  Value  Date    HGBA1C  6.5*  05/02/2013       Anemia in chronic kidney disease  Hgb appears to be stable      End stage renal disease on HD (T/Th/Sat)  -Nephrology following , TTS dialysis  -Patient with left AV fistula      Hypothyroid  Cont home synthroid dose      Peripheral vascular disease  As above      Hx of multiple myeloma   Out patient follow with primary hematologist oncologist post discharge.      Today   Subjective:   [name] today has no headache,no chest abdominal pain,no new weakness tingling or numbness, feels much better wants to go home today.   Objective:   Blood pressure 145/66, pulse 84, temperature 98.1 F (36.7 C), temperature  source Oral, resp. rate 18, height '5\' 10"'  (1.778 m), weight 72.3 kg (159 lb 6.3 oz), SpO2 100.00%.   Intake/Output Summary (Last 24 hours) at 05/07/13 1128 Last data filed at  05/07/13 0900  Gross per 24 hour  Intake    670 ml  Output   1001 ml  Net   -331 ml    Exam Awake Alert, Oriented *3, No new F.N deficits, Normal affect [place].AT,PERRAL Supple Neck,No JVD, No cervical lymphadenopathy appriciated.  Symmetrical Chest wall movement, Good air movement bilaterally, CTAB RRR,No Gallops,Rubs or new Murmurs, No Parasternal Heave +ve B.Sounds, Abd Soft, Non tender, No organomegaly appriciated, No rebound -guarding or rigidity. No Cyanosis, Clubbing or edema, No new Rash or bruise,Left AV fistula present, L foot in bandage     Data Review   CBC w Diff: Lab Results  Component Value Date   WBC 11.4* 05/06/2013   WBC 4.6 09/29/2012   HGB 9.2* 05/06/2013   HGB 12.2* 09/29/2012   HCT 29.7* 05/06/2013   HCT 38.0* 09/29/2012   PLT 312 05/06/2013   PLT 178 09/29/2012   LYMPHOPCT 10* 05/05/2013   LYMPHOPCT 24.1 09/29/2012   MONOPCT 9 05/05/2013   MONOPCT 10.0 09/29/2012   EOSPCT 0 05/05/2013   EOSPCT 2.2 09/29/2012   BASOPCT 0 05/05/2013   BASOPCT 1.3 09/29/2012    CMP: Lab Results  Component Value Date   NA 138 05/06/2013   NA 139 09/29/2012   K 3.6* 05/06/2013   K 4.0 09/29/2012   CL 94* 05/06/2013   CL 97* 06/10/2012   CO2 27 05/06/2013   CO2 31* 09/29/2012   BUN 36* 05/06/2013   BUN 29.0* 09/29/2012   CREATININE 8.03* 05/06/2013   CREATININE 7.3* 09/29/2012   PROT 8.2 05/05/2013   PROT 7.8 09/29/2012   ALBUMIN 2.3* 05/06/2013   ALBUMIN 3.6 09/29/2012   BILITOT 0.7 05/05/2013   BILITOT 0.39 09/29/2012   ALKPHOS 88 05/05/2013   ALKPHOS 76 09/29/2012   AST 13 05/05/2013   AST 13 09/29/2012   ALT 7 05/05/2013   ALT 15 09/29/2012  .   Total Time in preparing paper work, data evaluation and todays exam - 35 minutes  Thurnell Lose M.D on 05/07/2013 at 11:28 AM  [facility] Office  [phone]

## 2013-05-07 NOTE — Clinical Social Work Placement (Signed)
Clinical Social Work Department CLINICAL SOCIAL WORK PLACEMENT NOTE 05/07/2013  Patient:  Jack [name], Jack [name]  Account Number:  [id number] Admit date:  05/01/2013  Clinical Social Worker:  Joslin Doell Givens, LCSW  Date/time:  05/06/2013 07:14 AM  Clinical Social Work is seeking post-discharge placement for this patient at the following level of care:   [place]   (*CSW will update this form in Epic as items are completed)   05/06/2013  Patient/family provided with [facility] Department of Clinical Social Work's list of facilities offering this level of care within the geographic area requested by the patient (or if unable, by the patient's family).  05/06/2013  Patient/family informed of their freedom to choose among providers that offer the needed level of care, that participate in Medicare, Medicaid or managed care program needed by the patient, have an available bed and are willing to accept the patient.    Patient/family informed of [facility]' ownership interest in [facility], as well as of the fact that they are under no obligation to receive care at this facility.  PASARR submitted to EDS on 05/06/2013 PASARR number received from EDS on 05/06/2013  FL2 transmitted to all facilities in geographic area requested by pt/family on  05/06/2013 FL2 transmitted to all facilities within larger geographic area on   Patient informed that his/her managed care company has contracts with or will negotiate with  certain facilities, including the following:     Patient/family informed of bed offers received:  05/06/2013 Patient chooses bed at [facility] Physician recommends and patient chooses bed at    Patient to be transferred to [facility] on 05/07/13   Patient to be transferred to facility by ambulance  The following physician request were entered in Epic:  Additional Comments: 05/07/13: Daughters at the bedside.

## 2013-05-08 LAB — CULTURE, BLOOD (ROUTINE X 2)
CULTURE: NO GROWTH
Culture: NO GROWTH

## 2013-05-10 ENCOUNTER — Encounter: Payer: Self-pay | Admitting: *Deleted

## 2013-05-10 ENCOUNTER — Non-Acute Institutional Stay (SKILLED_NURSING_FACILITY): Payer: Medicare Other | Admitting: Internal Medicine

## 2013-05-10 DIAGNOSIS — E039 Hypothyroidism, unspecified: Secondary | ICD-10-CM

## 2013-05-10 DIAGNOSIS — E1129 Type 2 diabetes mellitus with other diabetic kidney complication: Secondary | ICD-10-CM

## 2013-05-10 DIAGNOSIS — I150 Renovascular hypertension: Secondary | ICD-10-CM

## 2013-05-10 DIAGNOSIS — J45909 Unspecified asthma, uncomplicated: Secondary | ICD-10-CM

## 2013-05-10 DIAGNOSIS — E1140 Type 2 diabetes mellitus with diabetic neuropathy, unspecified: Secondary | ICD-10-CM | POA: Insufficient documentation

## 2013-05-10 LAB — GLUCOSE, CAPILLARY
Glucose-Capillary: 115 mg/dL — ABNORMAL HIGH (ref 70–99)
Glucose-Capillary: 139 mg/dL — ABNORMAL HIGH (ref 70–99)

## 2013-05-10 NOTE — Progress Notes (Signed)
HISTORY & PHYSICAL  DATE: 05/10/2013   FACILITY: [facility]  LEVEL OF CARE: SNF (31)  ALLERGIES:  Allergies  Allergen Reactions  . Bee Venom Anaphylaxis  . Lisinopril Cough  . Ivp Dye [Iodinated Diagnostic Agents] Hives  . Morphine And Related Other (See Comments)    Bradycardia states patient    CHIEF COMPLAINT:  Manage diabetes mellitus, hypertension and hypothyroidism  HISTORY OF PRESENT ILLNESS: Patient is a 68 year old African American male who was hospitalized secondary to nausea and vomiting. After hospitalization is admitted to this facility for short-term rehabilitation.  DM:pt's DM remains stable.  Pt denies polyuria, polydipsia, polyphagia, changes in vision or hypoglycemic episodes.  No complications noted from the medication presently being used.  Last hemoglobin A1c is: 6.5.  HTN: Pt 's HTN remains stable.  Denies CP, sob, DOE, pedal edema, headaches, dizziness or visual disturbances.  No complications from the medications currently being used.  Last BP : 95/63.  HYPOTHYROIDISM: The hypothyroidism remains stable. No complications noted from the medications presently being used.  The patient denies fatigue or constipation.  Last TSH not available.  PAST MEDICAL HISTORY :  Past Medical History  Diagnosis Date  . Hypertension   . Shortness of breath   . Pneumonia     2012  . Heart murmur   . Glaucoma   . Multiple myeloma, without mention of having achieved remission 03/30/2012    Cytogenetic neg on 03/23/2012.  . Asthma   . Hyperparathyroidism, secondary renal   . Peripheral arterial disease   . End stage renal disease on dialysis     Stage 5  . Diabetes mellitus without complication     Type 1  . Thyroid disease     hyperparathyroidism  . MRSA bacteremia   . Anemia     PAST SURGICAL HISTORY: Past Surgical History  Procedure Laterality Date  . Thyroidectomy    . Cervical disc surgery    . Eye surgery      CATARACTS    . Insertion of dialysis catheter Right 03/19/2012    Procedure: INSERTION OF DIALYSIS CATHETER;  Surgeon: Mal Misty, MD;  Location: [place];  Service: Vascular;  Laterality: Right;  Right Internal Jugular  . Av fistula placement Left 03/25/2012    Procedure: ARTERIOVENOUS (AV) FISTULA CREATION;  Surgeon: Mal Misty, MD;  Location: [place];  Service: Vascular;  Laterality: Left;  . Ligation of competing branches of arteriovenous fistula Left 05/08/2012    Procedure: LIGATION OF COMPETING BRANCHES OF ARTERIOVENOUS FISTULA;  Surgeon: Mal Misty, MD;  Location: [place];  Service: Vascular;  Laterality: Left;  Ultrasound guided  . Cardiac catheterization      approx 30 years ago  . Amputation Right 11/10/2012    Procedure: AMPUTATION FIRST and SECOND TOES Right Foot;  Surgeon: Elam Dutch, MD;  Location: [facility];  Service: Vascular;  Laterality: Right;  . Transmetatarsal amputation Left 12/16/2012    Procedure: TRANSMETATARSAL AMPUTATION AND VAC PLACEMENT;  Surgeon: Elam Dutch, MD;  Location: [place];  Service: Vascular;  Laterality: Left;  . Amputation Left 04/07/2013    Procedure: AMPUTATION DIGIT- LEFT 1ST TOE;  Surgeon: Mal Misty, MD;  Location: [place];  Service: Vascular;  Laterality: Left;  . Tee without cardioversion N/A 04/20/2013    Procedure: TRANSESOPHAGEAL ECHOCARDIOGRAM (TEE);  Surgeon: Josue Hector, MD;  Location: [place];  Service: Cardiovascular;  Laterality: N/A;  . I&d  extremity Left 04/22/2013    Procedure: IRRIGATION AND DEBRIDEMENT LEFT FIRST TOE AMPUTATION WOUND ;  Surgeon: Mal Misty, MD;  Location: [place];  Service: Vascular;  Laterality: Left;  . Amputation Left 04/26/2013    Procedure: Left Foot Transmetatarsal Amputation;  Surgeon: Newt Minion, MD;  Location: [place];  Service: Orthopedics;  Laterality: Left;  . Toe amputation      D/C 04-30-13    SOCIAL HISTORY:  reports that he quit smoking about 31 years ago. His smoking use included Cigarettes. He  smoked 0.00 packs per day. He has never used smokeless tobacco. He reports that he does not drink alcohol or use illicit drugs.  FAMILY HISTORY:  Family History  Problem Relation Age of Onset  . Diabetes Mother   . Cancer Mother     bone   . Kidney disease Father     CURRENT MEDICATIONS: Reviewed per MAR/see medication list  REVIEW OF SYSTEMS:  See HPI otherwise 14 point ROS is negative.  PHYSICAL EXAMINATION  VS:  See VS section  GENERAL: no acute distress, normal body habitus EYES: conjunctivae normal, sclerae normal, normal eye lids MOUTH/THROAT: lips without lesions,no lesions in the mouth,tongue is without lesions,uvula elevates in midline NECK: supple, trachea midline, no neck masses, no thyroid tenderness, no thyromegaly LYMPHATICS: no LAN in the neck, no supraclavicular LAN RESPIRATORY: breathing is even & unlabored, BS CTAB CARDIAC: RRR, no murmur,no extra heart sounds, no edema GI:  ABDOMEN: abdomen soft, normal BS, no masses, no tenderness  LIVER/SPLEEN: no hepatomegaly, no splenomegaly MUSCULOSKELETAL: HEAD: normal to inspection & palpation BACK: no kyphosis, scoliosis or spinal processes tenderness EXTREMITIES: LEFT UPPER EXTREMITY: full range of motion, normal strength & tone RIGHT UPPER EXTREMITY:normal range of motion, normal strength & tone LEFT LOWER EXTREMITY:  Moderate range of motion,  strength & tone not tested, forefoot amputated and dressed RIGHT LOWER EXTREMITY:  Moderate range of motion, decreased strength & tone, forefoot amputated PSYCHIATRIC: the patient is alert & oriented to person, affect & behavior appropriate  LABS/RADIOLOGY:  Labs reviewed: Basic Metabolic Panel:  Recent Labs  04/30/13 1000  05/04/13 0847 05/05/13 0627 05/06/13 1257 05/07/13 1300  NA 136*  < > 142 139 138 139  K 4.1  < > 3.9 3.6* 3.6* 4.1  CL 92*  < > 92* 93* 94* 96  CO2 23  < > '23 24 27 27  ' GLUCOSE 148*  < > 149* 125* 145* 151*  BUN 38*  < > 53* 24* 36* 18    CREATININE 7.94*  < > 10.40* 5.84* 8.03* 5.15*  CALCIUM 10.1  < > 9.8 9.2 9.6 9.6  MG  --   --  2.3  --   --   --   PHOS 6.3*  --  7.0*  --  5.3*  --   < > = values in this interval not displayed. Liver Function Tests:  Recent Labs  05/03/13 0334 05/04/13 0847 05/05/13 0627 05/06/13 1257  AST '9 10 13  ' --   ALT <5 <5 7  --   ALKPHOS 89 85 88  --   BILITOT 0.8 0.7 0.7  --   PROT 8.0 8.3 8.2  --   ALBUMIN 2.2* 2.4* 2.3* 2.3*    Recent Labs  05/01/13 1921  LIPASE 7*    Recent Labs  02/24/13 0130  AMMONIA 26   CBC:  Recent Labs  05/01/13 1921  05/04/13 0847 05/05/13 0627 05/06/13 1257 05/07/13 1300  WBC 23.9*  < >  15.0* 13.6* 11.4* 11.1*  NEUTROABS 21.1*  --  12.5* 11.0*  --   --   HGB 11.0*  < > 9.3* 9.6* 9.2* 9.5*  HCT 35.2*  < > 30.3* 32.1* 29.7* 31.2*  MCV 86.1  < > 85.4 87.2 85.3 87.2  PLT 234  < > 277 280 312 335  < > = values in this interval not displayed.  CBG:  Recent Labs  05/06/13 2105 05/07/13 0717 05/07/13 1644  GLUCAP 141* 138* 139*    Transthoracic Echocardiography  Patient:    Forest, Redwine MR #:       [id number] Study Date: 04/20/2013 Gender:     M Age:        42 Height:     177.8cm Weight:     81.6kg BSA:        49m2 Pt. Status: Room:       6Orono Abraham  REFERRING    FKriste Basque SONOGRAPHER  RDonata Clay PERFORMING   Chmg, Inpatient cc:  ------------------------------------------------------------ LV EF: 55% -   60%  ------------------------------------------------------------ Indications:      Murmur 785.2.  ------------------------------------------------------------ History:   PMH:  Bradycardia. Acute Respiratory Failure With Hypoxia. Toe Gangrene.  Risk factors:  Hypertension. Diabetes mellitus.  ------------------------------------------------------------ Study Conclusions  - Left ventricle: The  cavity size was normal. There was   moderate concentric hypertrophy. Systolic function was   normal. The estimated ejection fraction was in the range   of 55% to 60%. Wall motion was normal; there were no   regional wall motion abnormalities. Features are   consistent with a pseudonormal left ventricular filling   pattern, with concomitant abnormal relaxation and   increased filling pressure (grade 2 diastolic   dysfunction). Doppler parameters are consistent with   elevated ventricular end-diastolic filling pressure. - Aortic valve: Trileaflet; mildly thickened, mildly   calcified leaflets. Sclerosis without stenosis. Trivial   regurgitation. - Aortic root: The aortic root was normal in size. - Mitral valve: Structurally normal valve. Trivial   regurgitation. - Left atrium: The atrium was mildly dilated. - Right ventricle: Systolic function was normal. - Right atrium: The atrium was normal in size. - Tricuspid valve: Trivial regurgitation. - Pulmonic valve: Trivial regurgitation. - Pulmonary arteries: Systolic pressure was within the   normal range. - Inferior vena cava: The vessel was normal in size. - Pericardium, extracardiac: A small amount of pericardial   effusion was identified posterior to the heart. Transthoracic echocardiography.  M-mode, complete 2D, spectral Doppler, and color Doppler.  Height:  Height: 177.8cm. Height: 70in.  Weight:  Weight: 81.6kg. Weight: 179.6lb.  Body mass index:  BMI: 25.8kg/m^2.  Body surface area:    BSA: 221m.  Blood pressure:     141/59.  Patient status:  Inpatient.  Location:  Bedside.  ------------------------------------------------------------  ------------------------------------------------------------ Left ventricle:  The cavity size was normal. There was moderate concentric hypertrophy. Systolic function was normal. The estimated ejection fraction was in the range of 55% to 60%. Wall motion was normal; there were no  regional wall motion abnormalities. Features are consistent with a pseudonormal left ventricular filling pattern, with concomitant abnormal relaxation and increased filling pressure (grade 2 diastolic dysfunction). Doppler parameters are consistent with elevated ventricular end-diastolic filling pressure.  ------------------------------------------------------------ Aortic valve:   Trileaflet; mildly thickened, mildly calcified leaflets. Mobility was not  restricted. Sclerosis without stenosis.  Doppler:  Transvalvular velocity was within the normal range. There was no stenosis.  Trivial regurgitation.  ------------------------------------------------------------ Aorta:  Aortic root: The aortic root was normal in size.  ------------------------------------------------------------ Mitral valve:   Structurally normal valve.   Mobility was not restricted.  Doppler:  Transvalvular velocity was within the normal range. There was no evidence for stenosis. Trivial regurgitation.  ------------------------------------------------------------ Left atrium:  The atrium was mildly dilated.  ------------------------------------------------------------ Right ventricle:  The cavity size was normal. Wall thickness was normal. Systolic function was normal.  ------------------------------------------------------------ Pulmonic valve:    Structurally normal valve.   Cusp separation was normal.  Doppler:  Transvalvular velocity was within the normal range. There was no evidence for stenosis.  Trivial regurgitation.  ------------------------------------------------------------ Tricuspid valve:   Structurally normal valve.    Doppler: Transvalvular velocity was within the normal range.  Trivial regurgitation.  ------------------------------------------------------------ Pulmonary artery:   The main pulmonary artery was normal-sized. Systolic pressure was within the normal range.    ------------------------------------------------------------ Right atrium:  The atrium was normal in size.  ------------------------------------------------------------ Pericardium:  A small amount of pericardial effusion was identified posterior to the heart.  ------------------------------------------------------------ Systemic veins: Inferior vena cava: The vessel was normal in size.  ------------------------------------------------------------  2D measurements        Normal  Doppler measurements   Normal Left ventricle                 Main pulmonary LVID ED,   35.2 mm     43-52   artery chord,                         Pressure, S    28 mm   =30 PLAX                                             Hg LVID ES,   25.8 mm     23-38   Tricuspid valve chord,                         Regurg peak   250 cm/s ------ PLAX                           vel FS, chord,   27 %      >29     Peak RV-RA     25 mm   ------ PLAX                           gradient, S       Hg LVPW, ED   15.5 mm     ------  Systemic veins IVS/LVPW   1.01        <1.3    Estimated       3 mm   ------ ratio, ED                      CVP               Hg Ventricular septum             Right ventricle IVS, ED    15.6 mm     ------  Pressure, S  28 mm   <30 Aorta                                            Hg Root diam,   34 mm     ------  Sa vel, lat  16.8 cm/s ------ ED                             ann, tiss DP Left atrium AP dim       39 mm     ------ AP dim     1.95 cm/m^2 <2.2 index   Transesophageal Echocardiography  Patient:    Ballard, Budney MR #:       [id number] Study Date: 04/20/2013 Gender:     M Age:        52 Height:     177.8cm Weight:     81.8kg BSA:        2.74m2 Pt. Status: Room:       6E29C    PERFORMING   NRiesa Pope  ATTENDING    TIrine Seal SONOGRAPHER  LMauricio Po RDCS, CCT  ADMITTING    HMerton Bordercc:  ------------------------------------------------------------ LV EF: 55% -   60%  ------------------------------------------------------------ Indications:      Endocarditis 421.9, suspected, pre-procedure.  ------------------------------------------------------------ Study Conclusions  - Left ventricle: There was moderate concentric hypertrophy.   Systolic function was normal. The estimated ejection   fraction was in the range of 55% to 60%. - Mitral valve: No evidence of vegetation. - Right atrium: No evidence of thrombus in the atrial cavity   or appendage. - Atrial septum: No defect or patent foramen ovale was   identified. Echo contrast study showed no right-to-left   atrial level shunt, following an increase in RA pressure   induced by provocative maneuvers. - Tricuspid valve: No evidence of vegetation. - Pulmonic valve: No evidence of vegetation. Impressions:  - No cardiac source of emboli was indentified. Transesophageal echocardiography.  2D and color Doppler. Height:  Height: 177.8cm. Height: 70in.  Weight:  Weight: 81.8kg. Weight: 180lb.  Body mass index:  BMI: 25.9kg/m^2. Body surface area:    BSA: 2.024m.  Blood pressure: 153/72.  Patient status:  Inpatient.  Location:  Endoscopy.   ------------------------------------------------------------  ------------------------------------------------------------ Left ventricle:  There was moderate concentric hypertrophy. Systolic function was normal. The estimated ejection fraction was in the range of 55% to 60%.  ------------------------------------------------------------ Aorta:  There was no evidence for dissection.  ------------------------------------------------------------ Mitral valve:   Structurally normal valve.   Leaflet separation was normal.  No evidence of vegetation.  Doppler:   No regurgitation.  ------------------------------------------------------------ Left atrium:  The atrium was  normal in size.  ------------------------------------------------------------ Atrial septum:  No defect or patent foramen ovale was identified.  Echo contrast study showed no right-to-left atrial level shunt, following an increase in RA pressure induced by provocative maneuvers.  ------------------------------------------------------------ Right ventricle:  The cavity size was normal. Wall thickness was normal. Systolic function was normal.  ------------------------------------------------------------ Pulmonic valve:    Structurally normal valve.   Cusp separation was normal.  No evidence of vegetation.  ------------------------------------------------------------ Tricuspid valve:   Structurally normal valve.   Leaflet separation was normal.  No evidence of vegetation.  Doppler:   Mild regurgitation.  ------------------------------------------------------------ Right  atrium:  The atrium was normal in size.  No evidence of thrombus in the atrial cavity or appendage.  ------------------------------------------------------------ Pericardium:  The pericardium was normal in appearance. There was no pericardial effusion.   ------------------------------------------------------------ Prepared and Electronically Authenticated by  Jenkins Rouge 2015-04-10T13:[id number]     Wall Scoring      2D Measurements    Dimensions                              LVIDD (cm)     LVIDS (cm)      IVS (cm)     LV PW (cm)      FS (%)     EF (%)      LA size (cm)     LA volume (cm3)      LV mass (g)     LV volume (cm3)                                           Aortic Root Measurements - End Diastolic                             Annulus (cm)     Sinus (cm)      STJ (cm)     Ao-prox (cm)      Ao-asc (cm)     Ao-arch (cm)                                           Main Pulmonary Measurements - End Diastolic                                 Annulus (cm)     MPA (cm)      LPA (cm)     RPA (cm)                                            Inferior Vena Cava                               Ostium (cm)     Proximal (cm)             Doppler Measurements    Aortic Valve                              Stenosis  Value  Regurgitation  Value   LVOT diam (cm)     SV 1 (cm3)      LVOT area (cm2)     SV 2 (cm3)      LVOT pk vel (m/s)     Reg frac (%)      LVOT VTI (cm)     P 1/2 time (m/s)      Ao pk vel (m/s)     Vena cont (cm)      Ao VTI (cm)         Valve area (  cm2)     PISA   Mean grad (mmHg)     Vnyquist (m/s)      Peak grad (mmHg)     Radius (cm)      Index-nat (V1/V2)     MR max vel (m/s)      Index-pros (V1/V2)     Area-PISA (cm2)             HCM    LVOT grad (mmHg)       Grad Vals (mmHg)       Grad Amyl (mmHg)       Grad exer (mmHg)                                            Mitral Valve                   Stenosis  Value  Regurgitation  Value   Mean grad (mmHg)     SV 1 (cm3)      Peak grad (mmHg)     SV 2 (cm3)      Area-PISA (cm2)     Reg frac (%)      Area-2D (cm2)     P 1/2 time (m/s)      Area-P 1/2 (cm2)     Vena cont (cm)      Area-cont eq (cm2)             Stress Evaluation  PISA   Exer grad (mmHg)     Vnyquist (m/s)      Exer area (mmHg)     Radius (cm)      Exer PAP (mmHg)     MR max vel (m/s)         Area-PISA (cm2)        Tricuspid Valve Stenosis  Value  Regurgitation  Value   RVOT diam (cm)     SV 1 (cm3)      RVOT area (cm2)     SV 2 (cm3)      RVOT pk vel (m/s)     Reg frac (%)      RVOT VTI (cm)     Vena cont (cm)      TV VTI (cm)         Mean grad (mmHg)       Peak grad (mmHg)       Valve area (cm2)           Stress Evaluation  PISA   Rest PAP (mmHg)     Vnyquist (m/s)      Peak PAP (mmHg)     Radius (cm)         MR max vel (m/s)       Area-PISA (cm2)       Pulmonic Valve Stenosis  Value  Regurgitation  Value   Mean grad (mmHg)     SV 1 (cm3)      Peak grad (mmHg)     SV 2 (cm3)      Valve area (cm2)     Reg frac (%)       Diastolic  Filing E/A ratio     E decel time (msec)      IVRT (msec)     MV"A" dur (msec)      Pulm vein S/D ratio     Pulm AR dur (msec)  TDI     E/e' ratio       Shunt Ratio LVOT SV (cm3)     RVOT SV (cm3)      Qp:Qs ratio                 MRI OF THE LEFT ANKLE WITHOUT CONTRAST   TECHNIQUE: Multiplanar, multisequence MR imaging of the ankle was performed. No intravenous contrast was administered.   COMPARISON:  Radiographs dated 03/23/2013   FINDINGS: There is circumferential edema in the subcutaneous tissues of the ankle and on the dorsum of the foot, nonspecific.   TENDONS   Peroneal: Normal.   Posteromedial: Normal.   Anterior: Normal.   Achilles: Normal.   Plantar Fascia: No acute abnormality. Chronic degenerative changes in the origin of the medial band of the plantar fascia.   LIGAMENTS   Lateral: Normal.   Medial: Normal.   .   CARTILAGE   Ankle Joint: Normal.   Subtalar Joints/Sinus Tarsi: Normal.   Bones: Minimal dorsal spurring on the distal talus. Minimal posterior spurring on the distal tibia.   IMPRESSION: No evidence of osteomyelitis or abscess. Edema in the subcutaneous tissues around the ankle and dorsum of the hindfoot, nonspecific.   MRI OF THE LEFT FOREFOOT WITHOUT CONTRAST   TECHNIQUE: Multiplanar, multisequence MR imaging was performed. No intravenous contrast was administered.   COMPARISON:  Radiograph dated 03/23/2013   FINDINGS: The great toe has been amputated. There is no bone destruction or edema in the first metatarsal. There is slight edema in the soft tissues adjacent to the head of the first metatarsal deep to the operative wound.   The other bones of the forefoot are essentially normal.   The adjacent muscles and tendons appear normal. No joint effusions. There is slight sclerosis and irregularity of the sesamoids at the head of the first metatarsal. This is not significant.   IMPRESSION: No evidence of abscess  or osteomyelitis. Small amount of fluid adjacent to the head of the first metatarsal is consistent with normal postsurgical seroma.   CHEST  2 VIEW   COMPARISON:  02/23/2013   FINDINGS: Mildly improved aeration of both lungs is noted. Elevation of right hemidiaphragm again seen. No evidence of pulmonary infiltrate or edema. No evidence of pleural effusion. Heart size and mediastinal contours are stable.   IMPRESSION: Stable elevation of right hemidiaphragm.  No active disease.   ACUTE ABDOMEN SERIES (ABDOMEN 2 VIEW & CHEST 1 VIEW)   COMPARISON:  05/01/2013, 02/24/2013   FINDINGS: Cardiac shadow is stable. There is elevation of the right hemidiaphragm again seen. No focal confluent infiltrate is seen.   Scattered large and small bowel gas is identified. Radiodense material is noted within the colon related to ingested material. A bladder calculus is again seen degenerative changes of the bony skeleton are noted. No free air is noted.   IMPRESSION: No acute abnormality is identified.   ASSESSMENT/PLAN:  Diabetes mellitus with renal complications-well controlled Renovascular hypertension-well-controlled Hypothyroidism-continue levothyroxine Asthma- compensated End-stage renal disease-on hemodialysis Check CBC and BMP  I have reviewed patient's medical records received at admission/from hospitalization.  CPT CODE: 37543  Gayani Y Dasanayaka, [place] [phone]

## 2013-05-13 ENCOUNTER — Other Ambulatory Visit (HOSPITAL_COMMUNITY): Payer: Self-pay | Admitting: Orthopedic Surgery

## 2013-05-13 MED ORDER — CEFAZOLIN SODIUM-DEXTROSE 2-3 GM-% IV SOLR
2.0000 g | INTRAVENOUS | Status: AC
  Administered 2013-05-14: 2 g via INTRAVENOUS
  Filled 2013-05-13: qty 50

## 2013-05-13 NOTE — Progress Notes (Signed)
I called patient's cell number and got his daughter [name] voice mail, I left number for her to call with any questions.

## 2013-05-13 NOTE — Progress Notes (Signed)
I spoke to [place] at [place], a nurse covering for patients nurse and gave instructions.

## 2013-05-14 ENCOUNTER — Inpatient Hospital Stay (HOSPITAL_COMMUNITY): Payer: Medicare Other | Admitting: Certified Registered"

## 2013-05-14 ENCOUNTER — Encounter (HOSPITAL_COMMUNITY): Payer: Self-pay | Admitting: Certified Registered"

## 2013-05-14 ENCOUNTER — Encounter (HOSPITAL_COMMUNITY)
Admission: RE | Disposition: A | Discharge status: Rehabilitation Facility | Payer: Self-pay | Source: Ambulatory Visit | Attending: Orthopedic Surgery

## 2013-05-14 ENCOUNTER — Encounter (HOSPITAL_COMMUNITY): Payer: Medicare Other | Admitting: Certified Registered"

## 2013-05-14 ENCOUNTER — Inpatient Hospital Stay (HOSPITAL_COMMUNITY)
Admission: RE | Admit: 2013-05-14 | Discharge: 2013-05-18 | DRG: 617 | Disposition: A | Discharge status: Rehabilitation Facility | Payer: Medicare Other | Source: Ambulatory Visit | Attending: Orthopedic Surgery | Admitting: Orthopedic Surgery

## 2013-05-14 DIAGNOSIS — M9080 Osteopathy in diseases classified elsewhere, unspecified site: Secondary | ICD-10-CM | POA: Diagnosis present

## 2013-05-14 DIAGNOSIS — D638 Anemia in other chronic diseases classified elsewhere: Secondary | ICD-10-CM | POA: Diagnosis present

## 2013-05-14 DIAGNOSIS — G609 Hereditary and idiopathic neuropathy, unspecified: Secondary | ICD-10-CM | POA: Diagnosis present

## 2013-05-14 DIAGNOSIS — I959 Hypotension, unspecified: Secondary | ICD-10-CM | POA: Diagnosis present

## 2013-05-14 DIAGNOSIS — Z89511 Acquired absence of right leg below knee: Secondary | ICD-10-CM

## 2013-05-14 DIAGNOSIS — J45909 Unspecified asthma, uncomplicated: Secondary | ICD-10-CM | POA: Diagnosis present

## 2013-05-14 DIAGNOSIS — R630 Anorexia: Secondary | ICD-10-CM | POA: Diagnosis present

## 2013-05-14 DIAGNOSIS — I739 Peripheral vascular disease, unspecified: Secondary | ICD-10-CM | POA: Diagnosis present

## 2013-05-14 DIAGNOSIS — Z89512 Acquired absence of left leg below knee: Secondary | ICD-10-CM

## 2013-05-14 DIAGNOSIS — Z89519 Acquired absence of unspecified leg below knee: Secondary | ICD-10-CM

## 2013-05-14 DIAGNOSIS — N186 End stage renal disease: Secondary | ICD-10-CM | POA: Diagnosis present

## 2013-05-14 DIAGNOSIS — M86179 Other acute osteomyelitis, unspecified ankle and foot: Secondary | ICD-10-CM | POA: Diagnosis present

## 2013-05-14 DIAGNOSIS — I120 Hypertensive chronic kidney disease with stage 5 chronic kidney disease or end stage renal disease: Secondary | ICD-10-CM | POA: Diagnosis present

## 2013-05-14 DIAGNOSIS — L03119 Cellulitis of unspecified part of limb: Secondary | ICD-10-CM

## 2013-05-14 DIAGNOSIS — M869 Osteomyelitis, unspecified: Secondary | ICD-10-CM | POA: Diagnosis present

## 2013-05-14 DIAGNOSIS — W07XXXA Fall from chair, initial encounter: Secondary | ICD-10-CM | POA: Diagnosis not present

## 2013-05-14 DIAGNOSIS — L97509 Non-pressure chronic ulcer of other part of unspecified foot with unspecified severity: Secondary | ICD-10-CM | POA: Diagnosis present

## 2013-05-14 DIAGNOSIS — E213 Hyperparathyroidism, unspecified: Secondary | ICD-10-CM | POA: Diagnosis present

## 2013-05-14 DIAGNOSIS — S88119A Complete traumatic amputation at level between knee and ankle, unspecified lower leg, initial encounter: Secondary | ICD-10-CM

## 2013-05-14 DIAGNOSIS — Z79899 Other long term (current) drug therapy: Secondary | ICD-10-CM

## 2013-05-14 DIAGNOSIS — Y9210 Unspecified residential institution as the place of occurrence of the external cause: Secondary | ICD-10-CM | POA: Diagnosis not present

## 2013-05-14 DIAGNOSIS — E1169 Type 2 diabetes mellitus with other specified complication: Principal | ICD-10-CM | POA: Diagnosis present

## 2013-05-14 DIAGNOSIS — Z8614 Personal history of Methicillin resistant Staphylococcus aureus infection: Secondary | ICD-10-CM

## 2013-05-14 DIAGNOSIS — I96 Gangrene, not elsewhere classified: Secondary | ICD-10-CM | POA: Diagnosis present

## 2013-05-14 DIAGNOSIS — D649 Anemia, unspecified: Secondary | ICD-10-CM | POA: Diagnosis present

## 2013-05-14 DIAGNOSIS — E46 Unspecified protein-calorie malnutrition: Secondary | ICD-10-CM | POA: Diagnosis present

## 2013-05-14 DIAGNOSIS — L02619 Cutaneous abscess of unspecified foot: Secondary | ICD-10-CM | POA: Diagnosis present

## 2013-05-14 DIAGNOSIS — Z992 Dependence on renal dialysis: Secondary | ICD-10-CM

## 2013-05-14 DIAGNOSIS — N2581 Secondary hyperparathyroidism of renal origin: Secondary | ICD-10-CM | POA: Diagnosis present

## 2013-05-14 DIAGNOSIS — Z87891 Personal history of nicotine dependence: Secondary | ICD-10-CM

## 2013-05-14 DIAGNOSIS — H409 Unspecified glaucoma: Secondary | ICD-10-CM | POA: Diagnosis present

## 2013-05-14 DIAGNOSIS — Z833 Family history of diabetes mellitus: Secondary | ICD-10-CM

## 2013-05-14 HISTORY — PX: AMPUTATION: SHX166

## 2013-05-14 HISTORY — DX: End stage renal disease: Z99.2

## 2013-05-14 HISTORY — PX: BELOW KNEE LEG AMPUTATION: SUR23

## 2013-05-14 HISTORY — DX: Dependence on renal dialysis: N18.6

## 2013-05-14 HISTORY — DX: Peripheral vascular disease, unspecified: I73.9

## 2013-05-14 LAB — COMPREHENSIVE METABOLIC PANEL
ALK PHOS: 87 U/L (ref 39–117)
ALT: 9 U/L (ref 0–53)
AST: 12 U/L (ref 0–37)
Albumin: 2.3 g/dL — ABNORMAL LOW (ref 3.5–5.2)
BILIRUBIN TOTAL: 0.7 mg/dL (ref 0.3–1.2)
BUN: 27 mg/dL — ABNORMAL HIGH (ref 6–23)
CO2: 22 meq/L (ref 19–32)
CREATININE: 6.32 mg/dL — AB (ref 0.50–1.35)
Calcium: 9.2 mg/dL (ref 8.4–10.5)
Chloride: 94 mEq/L — ABNORMAL LOW (ref 96–112)
GFR calc Af Amer: 9 mL/min — ABNORMAL LOW (ref >90)
GFR calc non Af Amer: 8 mL/min — ABNORMAL LOW (ref >90)
Glucose, Bld: 182 mg/dL — ABNORMAL HIGH (ref 70–99)
POTASSIUM: 3.5 meq/L — AB (ref 3.7–5.3)
Sodium: 137 mEq/L (ref 137–147)
Total Protein: 7.6 g/dL (ref 6.0–8.3)

## 2013-05-14 LAB — PROTIME-INR
INR: 1.21 (ref 0.00–1.49)
Prothrombin Time: 15 seconds (ref 11.6–15.2)

## 2013-05-14 LAB — PREPARE RBC (CROSSMATCH)

## 2013-05-14 LAB — GLUCOSE, CAPILLARY
GLUCOSE-CAPILLARY: 111 mg/dL — AB (ref 70–99)
GLUCOSE-CAPILLARY: 105 mg/dL — AB (ref 70–99)
Glucose-Capillary: 170 mg/dL — ABNORMAL HIGH (ref 70–99)
Glucose-Capillary: 136 mg/dL — ABNORMAL HIGH (ref 70–99)
Glucose-Capillary: 102 mg/dL — ABNORMAL HIGH (ref 70–99)

## 2013-05-14 LAB — CBC
HCT: 25.3 % — ABNORMAL LOW (ref 39.0–52.0)
Hemoglobin: 7.7 g/dL — ABNORMAL LOW (ref 13.0–17.0)
MCH: 26.2 pg (ref 26.0–34.0)
MCHC: 30.4 g/dL (ref 30.0–36.0)
MCV: 86.1 fL (ref 78.0–100.0)
PLATELETS: 303 10*3/uL (ref 150–400)
RBC: 2.94 MIL/uL — AB (ref 4.22–5.81)
RDW: 15.1 % (ref 11.5–15.5)
WBC: 11.5 10*3/uL — ABNORMAL HIGH (ref 4.0–10.5)

## 2013-05-14 LAB — ABO/RH: ABO/RH(D): B POS

## 2013-05-14 LAB — APTT: APTT: 38 s — AB (ref 24–37)

## 2013-05-14 SURGERY — AMPUTATION BELOW KNEE
Anesthesia: General | Site: Leg Lower | Laterality: Left

## 2013-05-14 MED ORDER — DOXAZOSIN MESYLATE 2 MG PO TABS
2.0000 mg | ORAL_TABLET | Freq: Every day | ORAL | Status: DC
  Administered 2013-05-16: 2 mg via ORAL
  Filled 2013-05-14 (×5): qty 1

## 2013-05-14 MED ORDER — LEVOTHYROXINE SODIUM 175 MCG PO TABS
175.0000 ug | ORAL_TABLET | Freq: Every day | ORAL | Status: DC
  Administered 2013-05-15 – 2013-05-18 (×4): 175 ug via ORAL
  Filled 2013-05-14 (×5): qty 1

## 2013-05-14 MED ORDER — PHENYLEPHRINE HCL 10 MG/ML IJ SOLN
INTRAMUSCULAR | Status: DC | PRN
  Administered 2013-05-14: 160 ug via INTRAVENOUS
  Administered 2013-05-14: 80 ug via INTRAVENOUS
  Administered 2013-05-14: 160 ug via INTRAVENOUS
  Administered 2013-05-14: 80 ug via INTRAVENOUS
  Administered 2013-05-14: 160 ug via INTRAVENOUS
  Administered 2013-05-14 (×2): 80 ug via INTRAVENOUS

## 2013-05-14 MED ORDER — ONDANSETRON HCL 4 MG PO TABS: 4.0000 mg | ORAL_TABLET | Freq: Four times a day (QID) | ORAL | Status: DC | PRN

## 2013-05-14 MED ORDER — PROPOFOL 10 MG/ML IV BOLUS
INTRAVENOUS | Status: AC
  Filled 2013-05-14: qty 20

## 2013-05-14 MED ORDER — VANCOMYCIN HCL IN DEXTROSE 1-5 GM/200ML-% IV SOLN
1000.0000 mg | INTRAVENOUS | Status: DC
  Administered 2013-05-15: 1000 mg via INTRAVENOUS
  Filled 2013-05-14 (×2): qty 200

## 2013-05-14 MED ORDER — METHOCARBAMOL 500 MG PO TABS
500.0000 mg | ORAL_TABLET | Freq: Four times a day (QID) | ORAL | Status: DC | PRN
  Administered 2013-05-14: 500 mg via ORAL
  Filled 2013-05-14: qty 1

## 2013-05-14 MED ORDER — FENTANYL CITRATE 0.05 MG/ML IJ SOLN
INTRAMUSCULAR | Status: AC
  Filled 2013-05-14: qty 5

## 2013-05-14 MED ORDER — EPHEDRINE SULFATE 50 MG/ML IJ SOLN
INTRAMUSCULAR | Status: DC | PRN
  Administered 2013-05-14 (×4): 10 mg via INTRAVENOUS

## 2013-05-14 MED ORDER — HYDROMORPHONE HCL PF 1 MG/ML IJ SOLN
0.5000 mg | INTRAMUSCULAR | Status: DC | PRN
  Administered 2013-05-14 (×2): 0.5 mg via INTRAVENOUS

## 2013-05-14 MED ORDER — METOCLOPRAMIDE HCL 5 MG/ML IJ SOLN: 5.0000 mg | Freq: Three times a day (TID) | INTRAMUSCULAR | Status: DC | PRN

## 2013-05-14 MED ORDER — NEBIVOLOL HCL 5 MG PO TABS
5.0000 mg | ORAL_TABLET | Freq: Every day | ORAL | Status: DC
  Administered 2013-05-15 – 2013-05-16 (×2): 5 mg via ORAL
  Filled 2013-05-14 (×5): qty 1

## 2013-05-14 MED ORDER — LOPERAMIDE HCL 2 MG PO CAPS
2.0000 mg | ORAL_CAPSULE | ORAL | Status: DC | PRN
  Filled 2013-05-14: qty 1

## 2013-05-14 MED ORDER — COLCHICINE 0.6 MG PO TABS
0.3000 mg | ORAL_TABLET | Freq: Every day | ORAL | Status: DC
  Administered 2013-05-14 – 2013-05-18 (×5): 0.3 mg via ORAL
  Filled 2013-05-14 (×5): qty 0.5

## 2013-05-14 MED ORDER — LIDOCAINE HCL (CARDIAC) 20 MG/ML IV SOLN
INTRAVENOUS | Status: DC | PRN
  Administered 2013-05-14: 100 mg via INTRAVENOUS

## 2013-05-14 MED ORDER — PHENYLEPHRINE 40 MCG/ML (10ML) SYRINGE FOR IV PUSH (FOR BLOOD PRESSURE SUPPORT)
PREFILLED_SYRINGE | INTRAVENOUS | Status: AC
  Filled 2013-05-14: qty 10

## 2013-05-14 MED ORDER — 0.9 % SODIUM CHLORIDE (POUR BTL) OPTIME
TOPICAL | Status: DC | PRN
  Administered 2013-05-14: 1000 mL

## 2013-05-14 MED ORDER — SODIUM CHLORIDE 0.9 % IV SOLN
INTRAVENOUS | Status: DC
  Administered 2013-05-14: 10 mL/h via INTRAVENOUS

## 2013-05-14 MED ORDER — PROPOFOL 10 MG/ML IV BOLUS
INTRAVENOUS | Status: DC | PRN
  Administered 2013-05-14: 200 mg via INTRAVENOUS

## 2013-05-14 MED ORDER — VANCOMYCIN HCL IN DEXTROSE 1-5 GM/200ML-% IV SOLN: 1000.0000 mg | INTRAVENOUS | Status: DC

## 2013-05-14 MED ORDER — HYDROMORPHONE HCL PF 1 MG/ML IJ SOLN
INTRAMUSCULAR | Status: AC
  Filled 2013-05-14: qty 1

## 2013-05-14 MED ORDER — ONDANSETRON HCL 4 MG/2ML IJ SOLN: 4.0000 mg | Freq: Four times a day (QID) | INTRAMUSCULAR | Status: DC | PRN

## 2013-05-14 MED ORDER — CEFAZOLIN SODIUM 1-5 GM-% IV SOLN: 1.0000 g | Freq: Four times a day (QID) | INTRAVENOUS | Status: DC

## 2013-05-14 MED ORDER — METHOCARBAMOL 1000 MG/10ML IJ SOLN: 500.0000 mg | Freq: Four times a day (QID) | INTRAVENOUS | Status: DC | PRN

## 2013-05-14 MED ORDER — SODIUM CHLORIDE 0.9 % IV SOLN
INTRAVENOUS | Status: DC | PRN
  Administered 2013-05-14 (×2): via INTRAVENOUS

## 2013-05-14 MED ORDER — RENA-VITE PO TABS
1.0000 | ORAL_TABLET | Freq: Every day | ORAL | Status: DC
  Administered 2013-05-14 – 2013-05-17 (×4): 1 via ORAL
  Filled 2013-05-14 (×5): qty 1

## 2013-05-14 MED ORDER — LIDOCAINE HCL (CARDIAC) 20 MG/ML IV SOLN
INTRAVENOUS | Status: AC
  Filled 2013-05-14: qty 5

## 2013-05-14 MED ORDER — CALCIUM ACETATE 667 MG PO CAPS
667.0000 mg | ORAL_CAPSULE | Freq: Three times a day (TID) | ORAL | Status: DC
  Administered 2013-05-14 – 2013-05-18 (×10): 667 mg via ORAL
  Filled 2013-05-14 (×14): qty 1

## 2013-05-14 MED ORDER — OXYCODONE-ACETAMINOPHEN 5-325 MG PO TABS
1.0000 | ORAL_TABLET | ORAL | Status: DC | PRN
  Administered 2013-05-14: 1 via ORAL
  Administered 2013-05-14 (×2): 2 via ORAL
  Administered 2013-05-15: 1 via ORAL
  Administered 2013-05-15 (×2): 2 via ORAL
  Administered 2013-05-15: 1 via ORAL
  Administered 2013-05-16: 2 via ORAL
  Administered 2013-05-17 – 2013-05-18 (×2): 1 via ORAL
  Administered 2013-05-18: 2 via ORAL
  Filled 2013-05-14: qty 2
  Filled 2013-05-14: qty 1
  Filled 2013-05-14 (×6): qty 2
  Filled 2013-05-14 (×2): qty 1

## 2013-05-14 MED ORDER — METOCLOPRAMIDE HCL 10 MG PO TABS: 5.0000 mg | ORAL_TABLET | Freq: Three times a day (TID) | ORAL | Status: DC | PRN

## 2013-05-14 MED ORDER — OXYCODONE-ACETAMINOPHEN 5-325 MG PO TABS
ORAL_TABLET | ORAL | Status: AC
  Filled 2013-05-14: qty 1

## 2013-05-14 SURGICAL SUPPLY — 48 items
BANDAGE ESMARK 6X9 LF (GAUZE/BANDAGES/DRESSINGS) IMPLANT
BANDAGE GAUZE ELAST BULKY 4 IN (GAUZE/BANDAGES/DRESSINGS) IMPLANT
BLADE 10 SAFETY STRL DISP (BLADE) ×3 IMPLANT
BLADE SAW RECIP 87.9 MT (BLADE) ×3 IMPLANT
BLADE SURG 21 STRL SS (BLADE) ×3 IMPLANT
BNDG COHESIVE 6X5 TAN STRL LF (GAUZE/BANDAGES/DRESSINGS) ×3 IMPLANT
BNDG ESMARK 6X9 LF (GAUZE/BANDAGES/DRESSINGS)
BNDG GAUZE ELAST 4 BULKY (GAUZE/BANDAGES/DRESSINGS) ×6 IMPLANT
COVER SURGICAL LIGHT HANDLE (MISCELLANEOUS) ×3 IMPLANT
CUFF TOURNIQUET SINGLE 34IN LL (TOURNIQUET CUFF) ×3 IMPLANT
CUFF TOURNIQUET SINGLE 44IN (TOURNIQUET CUFF) IMPLANT
DRAIN PENROSE 1/2X12 LTX STRL (WOUND CARE) IMPLANT
DRAPE EXTREMITY T 121X128X90 (DRAPE) ×3 IMPLANT
DRAPE PROXIMA HALF (DRAPES) ×3 IMPLANT
DRAPE U-SHAPE 47X51 STRL (DRAPES) ×3 IMPLANT
DRSG ADAPTIC 3X8 NADH LF (GAUZE/BANDAGES/DRESSINGS) ×3 IMPLANT
DRSG PAD ABDOMINAL 8X10 ST (GAUZE/BANDAGES/DRESSINGS) IMPLANT
DURAPREP 26ML APPLICATOR (WOUND CARE) ×3 IMPLANT
ELECT REM PT RETURN 9FT ADLT (ELECTROSURGICAL) ×3
ELECTRODE REM PT RTRN 9FT ADLT (ELECTROSURGICAL) ×1 IMPLANT
GLOVE BIOGEL PI IND STRL 7.0 (GLOVE) ×1 IMPLANT
GLOVE BIOGEL PI IND STRL 9 (GLOVE) ×1 IMPLANT
GLOVE BIOGEL PI INDICATOR 7.0 (GLOVE) ×2
GLOVE BIOGEL PI INDICATOR 9 (GLOVE) ×2
GLOVE SURG ORTHO 9.0 STRL STRW (GLOVE) ×3 IMPLANT
GLOVE SURG SS PI 7.0 STRL IVOR (GLOVE) ×6 IMPLANT
GOWN STRL REUS W/ TWL XL LVL3 (GOWN DISPOSABLE) ×2 IMPLANT
GOWN STRL REUS W/TWL XL LVL3 (GOWN DISPOSABLE) ×4
KIT BASIN OR (CUSTOM PROCEDURE TRAY) ×3 IMPLANT
KIT ROOM TURNOVER OR (KITS) ×3 IMPLANT
MANIFOLD NEPTUNE II (INSTRUMENTS) IMPLANT
NS IRRIG 1000ML POUR BTL (IV SOLUTION) ×3 IMPLANT
PACK GENERAL/GYN (CUSTOM PROCEDURE TRAY) ×3 IMPLANT
PAD ABD 8X10 STRL (GAUZE/BANDAGES/DRESSINGS) ×6 IMPLANT
PAD ARMBOARD 7.5X6 YLW CONV (MISCELLANEOUS) ×3 IMPLANT
SPONGE GAUZE 4X4 12PLY (GAUZE/BANDAGES/DRESSINGS) IMPLANT
SPONGE GAUZE 4X4 12PLY STER LF (GAUZE/BANDAGES/DRESSINGS) ×3 IMPLANT
SPONGE LAP 18X18 X RAY DECT (DISPOSABLE) IMPLANT
STAPLER VISISTAT 35W (STAPLE) ×3 IMPLANT
STOCKINETTE IMPERVIOUS LG (DRAPES) ×3 IMPLANT
SUT PDS AB 1 CT  36 (SUTURE) ×4
SUT PDS AB 1 CT 36 (SUTURE) ×2 IMPLANT
SUT SILK 2 0 (SUTURE) ×2
SUT SILK 2-0 18XBRD TIE 12 (SUTURE) ×1 IMPLANT
TOWEL OR 17X24 6PK STRL BLUE (TOWEL DISPOSABLE) ×3 IMPLANT
TOWEL OR 17X26 10 PK STRL BLUE (TOWEL DISPOSABLE) ×3 IMPLANT
TUBE ANAEROBIC SPECIMEN COL (MISCELLANEOUS) IMPLANT
WATER STERILE IRR 1000ML POUR (IV SOLUTION) ×3 IMPLANT

## 2013-05-14 NOTE — Op Note (Signed)
OPERATIVE REPORT  DATE OF SURGERY: 05/14/2013  PATIENT:  Jack [name],  68 y.o. male  PRE-OPERATIVE DIAGNOSIS:  Gangrene Left Foot  POST-OPERATIVE DIAGNOSIS:  Gangrene Left Foot  PROCEDURE:  Procedure(s): AMPUTATION BELOW KNEE  SURGEON:  Surgeon(s): Newt Minion, MD  ANESTHESIA:   general  EBL:  min ML  SPECIMEN:  Source of Specimen:  Left leg  TOURNIQUET:   Total Tourniquet Time Documented: Thigh (Left) - -17275 minutes Total: Thigh (Left) - -17275 minutes   PROCEDURE DETAILS: Patient is a 68 year old gentleman with severe peripheral vascular disease on dialysis who presents with gangrenous changes to the left foot. Patient has failed limb salvage and presents at this time for transtibial amputation. Risks and benefits were discussed including need for higher level amputation. Patient states he understands and wished to proceed at this time. Description of procedure patient was brought to the operating room and underwent a general anesthetic. After adequate levels and anesthesia were obtained patient's left lower extremity was prepped using DuraPrep draped into a sterile field and the foot was draped out of sterile field with an impervious stockinette. A transverse incision was made 11 cm distal to the tibial tubercle this curved proximally and a large posterior flap was created. The tibia and fibula was transected just proximal to the skin incision. The sciatic nerve was pulled cut and allowed to retract. The vascular bundles were suture ligated with 2-0 silk. A knife was used to create a large posterior flap. The deep and superficial fascial layers were closed using #1 PDS. The skin was closed using staples and nylon. A compressive dressing was applied. Patient was extubated taken to the PACU in stable condition. Plan for discharge to skilled nursing.  PLAN OF CARE: Admit to inpatient   PATIENT DISPOSITION:  PACU - hemodynamically stable.   Newt Minion, MD 05/14/2013 4:31  PM

## 2013-05-14 NOTE — Progress Notes (Signed)
[facility] called to find out when he took each of his meds.  Attempted 4 times to speak with the nurse, no answer each time, after call was transferred.  Asked to speak with the supervisor, staff member states hold on, but again phone call disconnected.

## 2013-05-14 NOTE — Transfer of Care (Signed)
Immediate Anesthesia Transfer of Care Note  Patient: Jack [name]  Procedure(s) Performed: Procedure(s) with comments: AMPUTATION BELOW KNEE (Left) - Left Below Knee Amputation  Patient Location: PACU  Anesthesia Type:General  Level of Consciousness: awake, alert  and responds to stimulation  Airway & Oxygen Therapy: Patient Spontanous Breathing and Patient connected to nasal cannula oxygen  Post-op Assessment: Report given to PACU RN and Post -op Vital signs reviewed and stable  Post vital signs: Reviewed and stable  Complications: No apparent anesthesia complications

## 2013-05-14 NOTE — Progress Notes (Signed)
[facility] called for the 5th time and this time was able to speak with the nurse and update his med list.

## 2013-05-14 NOTE — Anesthesia Preprocedure Evaluation (Addendum)
Anesthesia Evaluation  Patient identified by MRN, date of birth, ID band Patient awake    Reviewed: Allergy & Precautions, H&P , NPO status , Patient's Chart, lab work & pertinent test results  History of Anesthesia Complications Negative for: history of anesthetic complications  Airway Mallampati: I TM Distance: >3 FB Neck ROM: Full    Dental  (+) Missing, Poor Dentition   Pulmonary former smoker,          Cardiovascular hypertension, Pt. on medications + Peripheral Vascular Disease     Neuro/Psych    GI/Hepatic   Endo/Other  diabetes, Type 2, Oral Hypoglycemic AgentsHypothyroidism   Renal/GU ESRF and DialysisRenal disease     Musculoskeletal   Abdominal   Peds  Hematology  (+) Blood dyscrasia, anemia , hgb 7.7   Anesthesia Other Findings   Reproductive/Obstetrics                         Anesthesia Physical Anesthesia Plan  ASA: III  Anesthesia Plan: General   Post-op Pain Management:    Induction: Intravenous  Airway Management Planned: LMA  Additional Equipment:   Intra-op Plan:   Post-operative Plan:   Informed Consent: I have reviewed the patients History and Physical, chart, labs and discussed the procedure including the risks, benefits and alternatives for the proposed anesthesia with the patient or authorized representative who has indicated his/her understanding and acceptance.   Dental advisory given  Plan Discussed with: Surgeon and CRNA  Anesthesia Plan Comments:        Anesthesia Quick Evaluation

## 2013-05-14 NOTE — H&P (Signed)
Jack [name] is an 68 y.o. male.   Chief Complaint: Osteomyelitis abscess ulceration left foot HPI: Patient is a 68 year old gentleman with diabetic insensate neuropathy peripheral vascular disease who presents with ulceration abscess infection left foot  Past Medical History  Diagnosis Date  . Hypertension   . Shortness of breath   . Pneumonia     2012  . Heart murmur   . Glaucoma   . Multiple myeloma, without mention of having achieved remission 03/30/2012    Cytogenetic neg on 03/23/2012.  . Asthma   . Hyperparathyroidism, secondary renal   . Peripheral arterial disease   . End stage renal disease on dialysis     Stage 5  . Diabetes mellitus without complication     Type 1  . Thyroid disease     hyperparathyroidism  . MRSA bacteremia   . Anemia     Past Surgical History  Procedure Laterality Date  . Thyroidectomy    . Cervical disc surgery    . Eye surgery      CATARACTS  . Insertion of dialysis catheter Right 03/19/2012    Procedure: INSERTION OF DIALYSIS CATHETER;  Surgeon: Mal Misty, MD;  Location: [place];  Service: Vascular;  Laterality: Right;  Right Internal Jugular  . Av fistula placement Left 03/25/2012    Procedure: ARTERIOVENOUS (AV) FISTULA CREATION;  Surgeon: Mal Misty, MD;  Location: [place];  Service: Vascular;  Laterality: Left;  . Ligation of competing branches of arteriovenous fistula Left 05/08/2012    Procedure: LIGATION OF COMPETING BRANCHES OF ARTERIOVENOUS FISTULA;  Surgeon: Mal Misty, MD;  Location: [place];  Service: Vascular;  Laterality: Left;  Ultrasound guided  . Cardiac catheterization      approx 30 years ago  . Amputation Right 11/10/2012    Procedure: AMPUTATION FIRST and SECOND TOES Right Foot;  Surgeon: Elam Dutch, MD;  Location[facility];  Service: Vascular;  Laterality: Right;  . Transmetatarsal amputation Left 12/16/2012    Procedure: TRANSMETATARSAL AMPUTATION AND VAC PLACEMENT;  Surgeon: Elam Dutch, MD;  Location:  [place];  Service: Vascular;  Laterality: Left;  . Amputation Left 04/07/2013    Procedure: AMPUTATION DIGIT- LEFT 1ST TOE;  Surgeon: Mal Misty, MD;  Location: [place];  Service: Vascular;  Laterality: Left;  . Tee without cardioversion N/A 04/20/2013    Procedure: TRANSESOPHAGEAL ECHOCARDIOGRAM (TEE);  Surgeon: Josue Hector, MD;  Location: [facility] ENDOSCOPY;  Service: Cardiovascular;  Laterality: N/A;  . I&d extremity Left 04/22/2013    Procedure: SWFUXNATFT DDU DEBRIDEMENT LEFT FIRST TOE AMPUTATION WOUND ;  Surgeon: Mal Misty, MD;  Location: [place];  Service: Vascular;  Laterality: Left;  . Amputation Left 04/26/2013    Procedure: Left Foot Transmetatarsal Amputation;  Surgeon: Newt Minion, MD;  Location: [place];  Service: Orthopedics;  Laterality: Left;  . Toe amputation      D/C 04-30-13    Family History  Problem Relation Age of Onset  . Diabetes Mother   . Cancer Mother     bone   . Kidney disease Father    Social History:  reports that he quit smoking about 31 years ago. His smoking use included Cigarettes. He smoked 0.00 packs per day. He has never used smokeless tobacco. He reports that he does not drink alcohol or use illicit drugs.  Allergies:  Allergies  Allergen Reactions  . Bee Venom Anaphylaxis  . Lisinopril Cough  . Ivp Dye [Iodinated Diagnostic Agents]  Hives  . Morphine And Related Other (See Comments)    Bradycardia states patient    No prescriptions prior to admission    No results found for this or any previous visit (from the past 48 hour(s)). No results found.  Review of Systems  All other systems reviewed and are negative.   There were no vitals taken for this visit. Physical Exam  Ulceration abscess osteomyelitis left foot Assessment/Plan Assessment: Osteomyelitis abscess ulceration left foot.  Plan: We'll plan for a left transtibial amputation. Risks and benefits were discussed including nonhealing of the wound need for higher level amputation.  Patient states he understands and wished to proceed at this time.  Newt Minion 05/14/2013, 6:14 AM

## 2013-05-14 NOTE — Progress Notes (Signed)
Subjective:   Patient was admitted today for left transtibial amputation by orthopedics. He receives HD MWF @ [facility]. He was just discharged 4/24 to [facility] for rehab, after admission for a L transmet amputation 4/15 and MRSA bacteremia and is to complete IV vanc until 5/15 per ID (see note from 4/14). He reports feeling pretty well but did complain of increased fatigue and loss of appetite since discharge. He reports having a tough time with rehab due to fatigue. He had a previous R transmet amputation that had healed well but required further amputation of LLE due to infection.  Objective Filed Vitals:   05/14/13 1437 05/14/13 1445 05/14/13 1500 05/14/13 1515  BP: 78/24     Pulse: 69 69 88 70  Temp:      TempSrc:      Resp: 16 17 29 10   SpO2: 100% 100% 99% 94%   Physical Exam General: alert and oriented. Surgical pain. Heart: RRR GR 2/6 SEM Lungs: CTA. unlabored Abdomen: soft, nontender +BS Liver down 4 cm Extremities: R transmet amputation well healed.  L transtibial amputation with post op dressing intact  Dialysis Access: L AVF +bruit/thrill  D[facility]  4h 400/800 85.5kg dry wt Heparin 3500 LUA AVF   Assessment/Plan: 1. L transtibial amputation 5/1 by ortho. Post op dressing dry and intact 2. ESRD - MWF @ [place], HD pending tomorrow then get back on schedule. 3. Anemia - hgb 7.7 (was 9.5 4/24) 1 u PRBC infusing now. Cont Aranesp 100. Watch CBC, no heparin post op More anemic than would expect for him. Follow closely 4. Secondary hyperparathyroidism - Ca+9.2 / corrected 10.5, use 2 Ca+ bath. Cont fosrenol when diet advanced 5. HTN/volume - hypotension now 88/48. Watch close 6. Nutrition - alb 2.3. Poor appetite. Currently NPO, renal when advanced 7. Multiple myloma- next onc follow up in June 8. dispo- likely rehab or back to SNF 9 Severe PVD  Shelle Iron, NP [place] [phone] 05/14/2013,3:59 PM  LOS: 0 days  I have seen and examined  this patient and agree with the plan of care seen,eval, examined and discussed .  Joyice Faster Oris Staffieri 05/14/2013, 7:10 PM    Additional Objective Labs: Basic Metabolic Panel:  Recent Labs Lab 05/14/13 1010  NA 137  K 3.5*  CL 94*  CO2 22  GLUCOSE 182*  BUN 27*  CREATININE 6.32*  CALCIUM 9.2   Liver Function Tests:  Recent Labs Lab 05/14/13 1010  AST 12  ALT 9  ALKPHOS 87  BILITOT 0.7  PROT 7.6  ALBUMIN 2.3*   No results found for this basename: LIPASE, AMYLASE,  in the last 168 hours CBC:  Recent Labs Lab 05/14/13 1010  WBC 11.5*  HGB 7.7*  HCT 25.3*  MCV 86.1  PLT 303   Blood Culture    Component Value Date/Time   SDES BLOOD RIGHT HAND 05/01/2013 2100   SPECREQUEST BOTTLES DRAWN AEROBIC ONLY [facility] 05/01/2013 2100   CULT  Value: NO GROWTH 5 DAYS Performed at [facility] 05/01/2013 2100   REPTSTATUS 05/08/2013 FINAL 05/01/2013 2100    Cardiac Enzymes: No results found for this basename: CKTOTAL, CKMB, CKMBINDEX, TROPONINI,  in the last 168 hours CBG:  Recent Labs Lab 05/07/13 1644 05/14/13 1005 05/14/13 1223 05/14/13 1344  GLUCAP 139* 170* 136* 102*   Iron Studies: No results found for this basename: IRON, TIBC, TRANSFERRIN, FERRITIN,  in the last 72 hours @lablastinr3 @ Studies/Results: No results found. Medications:   .  HYDROmorphone      . oxyCODONE-acetaminophen

## 2013-05-15 LAB — CBC WITH DIFFERENTIAL/PLATELET
BASOS PCT: 0 % (ref 0–1)
Basophils Absolute: 0 10*3/uL (ref 0.0–0.1)
EOS ABS: 0.1 10*3/uL (ref 0.0–0.7)
Eosinophils Relative: 1 % (ref 0–5)
HCT: 28 % — ABNORMAL LOW (ref 39.0–52.0)
HEMOGLOBIN: 8.6 g/dL — AB (ref 13.0–17.0)
Lymphocytes Relative: 15 % (ref 12–46)
Lymphs Abs: 1.5 10*3/uL (ref 0.7–4.0)
MCH: 26.3 pg (ref 26.0–34.0)
MCHC: 30.7 g/dL (ref 30.0–36.0)
MCV: 85.6 fL (ref 78.0–100.0)
MONOS PCT: 8 % (ref 3–12)
Monocytes Absolute: 0.8 10*3/uL (ref 0.1–1.0)
NEUTROS ABS: 7.4 10*3/uL (ref 1.7–7.7)
NEUTROS PCT: 76 % (ref 43–77)
PLATELETS: 318 10*3/uL (ref 150–400)
RBC: 3.27 MIL/uL — ABNORMAL LOW (ref 4.22–5.81)
RDW: 15.4 % (ref 11.5–15.5)
WBC: 9.7 10*3/uL (ref 4.0–10.5)

## 2013-05-15 LAB — RENAL FUNCTION PANEL
Albumin: 2.1 g/dL — ABNORMAL LOW (ref 3.5–5.2)
BUN: 34 mg/dL — AB (ref 6–23)
CALCIUM: 9.1 mg/dL (ref 8.4–10.5)
CO2: 25 mEq/L (ref 19–32)
Chloride: 95 mEq/L — ABNORMAL LOW (ref 96–112)
Creatinine, Ser: 7.81 mg/dL — ABNORMAL HIGH (ref 0.50–1.35)
GFR calc Af Amer: 7 mL/min — ABNORMAL LOW (ref >90)
GFR calc non Af Amer: 6 mL/min — ABNORMAL LOW (ref >90)
Glucose, Bld: 94 mg/dL (ref 70–99)
PHOSPHORUS: 5.1 mg/dL — AB (ref 2.3–4.6)
POTASSIUM: 3.6 meq/L — AB (ref 3.7–5.3)
Sodium: 139 mEq/L (ref 137–147)

## 2013-05-15 LAB — TYPE AND SCREEN
ABO/RH(D): B POS
ANTIBODY SCREEN: NEGATIVE
UNIT DIVISION: 0

## 2013-05-15 LAB — GLUCOSE, CAPILLARY
GLUCOSE-CAPILLARY: 96 mg/dL (ref 70–99)
Glucose-Capillary: 99 mg/dL (ref 70–99)
Glucose-Capillary: 117 mg/dL — ABNORMAL HIGH (ref 70–99)

## 2013-05-15 MED ORDER — HEPARIN SODIUM (PORCINE) 1000 UNIT/ML DIALYSIS: 1000.0000 [IU] | INTRAMUSCULAR | Status: DC | PRN

## 2013-05-15 MED ORDER — NEPRO/CARBSTEADY PO LIQD
237.0000 mL | ORAL | Status: DC | PRN
  Filled 2013-05-15: qty 237

## 2013-05-15 MED ORDER — LIDOCAINE HCL (PF) 1 % IJ SOLN
5.0000 mL | INTRAMUSCULAR | Status: DC | PRN
Start: 2013-05-15 — End: 2013-05-15

## 2013-05-15 MED ORDER — ALTEPLASE 2 MG IJ SOLR
2.0000 mg | Freq: Once | INTRAMUSCULAR | Status: DC | PRN
  Filled 2013-05-15: qty 2

## 2013-05-15 MED ORDER — PENTAFLUOROPROP-TETRAFLUOROETH EX AERO
1.0000 | INHALATION_SPRAY | CUTANEOUS | Status: DC | PRN
Start: 2013-05-15 — End: 2013-05-15

## 2013-05-15 MED ORDER — SODIUM CHLORIDE 0.9 % IV SOLN: 100.0000 mL | INTRAVENOUS | Status: DC | PRN

## 2013-05-15 MED ORDER — LIDOCAINE-PRILOCAINE 2.5-2.5 % EX CREA
1.0000 | TOPICAL_CREAM | CUTANEOUS | Status: DC | PRN
Start: 2013-05-15 — End: 2013-05-15

## 2013-05-15 MED ORDER — DARBEPOETIN ALFA-POLYSORBATE 100 MCG/0.5ML IJ SOLN: 100.0000 ug | Freq: Once | INTRAMUSCULAR | Status: AC

## 2013-05-15 MED ORDER — DARBEPOETIN ALFA-POLYSORBATE 100 MCG/0.5ML IJ SOLN
INTRAMUSCULAR | Status: AC
  Filled 2013-05-15: qty 0.5

## 2013-05-15 MED ORDER — DARBEPOETIN ALFA-POLYSORBATE 100 MCG/0.5ML IJ SOLN
INTRAMUSCULAR | Status: AC
  Administered 2013-05-15: 100 ug
  Filled 2013-05-15: qty 0.5

## 2013-05-15 NOTE — Progress Notes (Addendum)
Rehab Admissions Coordinator Note:  Patient was screened by [name] for appropriateness for an Inpatient Acute Rehab Consult per PT recommendation. At this time,  Await inpt rehab consultation on Monday. Will also need an OT eval which is pending.  Audelia Acton [facility] 05/15/2013, 7:30 PM  I can be reached at [phone].

## 2013-05-15 NOTE — Progress Notes (Signed)
Subjective: 1 Day Post-Op Procedure(s) (LRB): AMPUTATION BELOW KNEE (Left) Patient reports pain as moderate.  Reports pain improved from yesterday. Receiving hemodialysis presently.  Objective: Vital signs in last 24 hours: Temp:  [97.5 F (36.4 C)-98.4 F (36.9 C)] 97.6 F (36.4 C) (05/02 0500) Pulse Rate:  [59-88] 64 (05/02 0646) Resp:  [9-29] 16 (05/02 0500) BP: (75-111)/(24-61) 111/61 mmHg (05/02 0646) SpO2:  [94 %-100 %] 99 % (05/02 0500)  Intake/Output from previous day: 05/01 0701 - 05/02 0700 In: 1002.5 [P.O.:240; I.V.:500; Blood:262.5] Out: 100 [Blood:100] Intake/Output this shift:     Recent Labs  05/14/13 1010 05/15/13 0531  HGB 7.7* 8.6*    Recent Labs  05/14/13 1010 05/15/13 0531  WBC 11.5* 9.7  RBC 2.94* 3.27*  HCT 25.3* 28.0*  PLT 303 318    Recent Labs  05/14/13 1010  NA 137  K 3.5*  CL 94*  CO2 22  BUN 27*  CREATININE 6.32*  GLUCOSE 182*  CALCIUM 9.2    Recent Labs  05/14/13 1010  INR 1.21   Left leg: Incision: dressing C/D/I Right leg s/p trans met amputation well healed wound calf supple and non tender  Assessment/Plan: 1 Day Post-Op Procedure(s) (LRB): AMPUTATION BELOW KNEE (Left) Up with therapy WBC trending down and afebrile   Erskine Emery 05/15/2013, 9:19 AM

## 2013-05-15 NOTE — Progress Notes (Signed)
Subjective: Interval History: has no complaint,pain controlled.  Objective: Vital signs in last 24 hours: Temp:  [97.5 F (36.4 C)-98.4 F (36.9 C)] 97.6 F (36.4 C) (05/02 0500) Pulse Rate:  [59-88] 64 (05/02 0930) Resp:  [9-29] 16 (05/02 0500) BP: (75-118)/(24-63) 110/57 mmHg (05/02 0930) SpO2:  [94 %-100 %] 99 % (05/02 0500) Weight change:   Intake/Output from previous day: 05/01 0701 - 05/02 0700 In: 1002.5 [P.O.:240; I.V.:500; Blood:262.5] Out: 100 [Blood:100] Intake/Output this shift:    General appearance: alert, cooperative, pale and chronically ill Resp: diminished breath sounds bilaterally Cardio: S1, S2 normal and systolic murmur: systolic ejection 3/6, decrescendo at 2nd left intercostal space GI: pos bs, liver down 4 cm, soft Extremities: L BKA, R TMA  Lab Results:  Recent Labs  05/14/13 1010 05/15/13 0531  WBC 11.5* 9.7  HGB 7.7* 8.6*  HCT 25.3* 28.0*  PLT 303 318   BMET:  Recent Labs  05/14/13 1010  NA 137  K 3.5*  CL 94*  CO2 22  GLUCOSE 182*  BUN 27*  CREATININE 6.32*  CALCIUM 9.2   No results found for this basename: PTH,  in the last 72 hours Iron Studies: No results found for this basename: IRON, TIBC, TRANSFERRIN, FERRITIN,  in the last 72 hours  Studies/Results: No results found.  I have reviewed the patient's current medications.  Assessment/Plan: 1 CRF for HD. Mild xs vol 2 Anemia got blood still severe, cont epo 3 PVD per Ortho 4 DM controlled 5 Malnutrition  P HD, mobilize, follow Hb, epo    LOS: 1 day   Alylah Blakney L Alycen Mack 05/15/2013,9:43 AM

## 2013-05-15 NOTE — Procedures (Signed)
I was present at this session.  I have reviewed the session itself and made appropriate changes.   HD via LUA avf.  bp low 100s, will have to limit vol off. Jack [name] 5/2/20159:42 AM   Jack [name] 5/2/20159:42 AM

## 2013-05-15 NOTE — Evaluation (Signed)
Physical Therapy Evaluation Patient Details Name: Jack [name] MRN: [id number] DOB: [birth year] Today's Date: 05/15/2013   History of Present Illness  patient admitted for gangrene left LE and underwent Left BKA on 05/14/13.  Patient with old transmet amputation on Right.  Also with history of ESRD (HD on MWF) and MRSA bacteremia (now negative)  Clinical Impression  Patient fatigued today due to HD this morning.  Patient did well sitting on EOB, unable to follow some basic commands today, unsure if that is baseline or not.  From chart, it appears patient has good family support.  Feel he may benefit from inpatient rehab stay to reach max potential for discharge home.  Patient will benefit from PT to progress mobility and independence.     Follow Up Recommendations CIR    Equipment Recommendations  None recommended by PT    Recommendations for Other Services       Precautions / Restrictions Precautions Precautions: Fall      Mobility  Bed Mobility Overal bed mobility: Needs Assistance Bed Mobility: Supine to Sit;Sit to Supine;Rolling Rolling: Modified independent (Device/Increase time)   Supine to sit: Min assist Sit to supine: Min assist   General bed mobility comments: used railing; assistance given to raise shoulders off bed  Transfers                    Ambulation/Gait                Stairs            Wheelchair Mobility    Modified Rankin (Stroke Patients Only)       Balance Overall balance assessment: Needs assistance Sitting-balance support: Bilateral upper extremity supported Sitting balance-Leahy Scale: Poor Sitting balance - Comments: unable to sit without support.                                     Pertinent Vitals/Pain Patient reports pain in left leg, 10/10 sitting on EOB.  Patient was pre-medicated prior to session.  Monitored pain throughout session.      Home Living Family/patient expects to be  discharged to:: Private residence Living Arrangements: Children Available Help at Discharge: Family;Available 24 hours/day Type of Home: Apartment Home Access: Level entry     Home Layout: One level Home Equipment: Walker - 2 wheels;Cane - single point;Shower seat;Wheelchair - manual      Prior Function Level of Independence: Needs assistance   Gait / Transfers Assistance Needed: patient reports he needed assistance to get to w/c  ADL's / Homemaking Assistance Needed: Assist with bathing and dressing        Hand Dominance   Dominant Hand: Right    Extremity/Trunk Assessment   Upper Extremity Assessment: Defer to OT evaluation           Lower Extremity Assessment: Generalized weakness RLE Deficits / Details: well healed transmet amputation    Cervical / Trunk Assessment: Kyphotic  Communication   Communication: No difficulties  Cognition Arousal/Alertness: Lethargic;Suspect due to medications (or HD today) Behavior During Therapy: Flat affect Overall Cognitive Status: No family/caregiver present to determine baseline cognitive functioning Area of Impairment: Following commands;Problem solving       Following Commands: Follows one step commands inconsistently     Problem Solving: Slow processing;Decreased initiation      General Comments      Exercises Amputee Exercises Knee Flexion: AROM;Left;5 reps;Seated Knee  Extension: AROM;Left;5 reps;Seated      Assessment/Plan    PT Assessment Patient needs continued PT services  PT Diagnosis Difficulty walking;Generalized weakness   PT Problem List Decreased strength;Decreased range of motion;Decreased activity tolerance;Decreased balance;Decreased mobility;Decreased coordination;Decreased cognition;Decreased knowledge of use of DME;Pain  PT Treatment Interventions DME instruction;Gait training;Functional mobility training;Therapeutic exercise;Therapeutic activities;Balance training;Patient/family education    PT Goals (Current goals can be found in the Care Plan section) Acute Rehab PT Goals Patient Stated Goal: lie back down PT Goal Formulation: With patient Time For Goal Achievement: 05/29/13 Potential to Achieve Goals: Fair    Frequency Min 3X/week   Barriers to discharge   need to clarify family support     Co-evaluation               End of Session   Activity Tolerance: Patient limited by fatigue;Patient limited by pain Patient left: in bed;with call bell/phone within reach Nurse Communication: Mobility status         Time: 1537-1550 PT Time Calculation (min): 13 min   Charges:   PT Evaluation $Initial PT Evaluation Tier I: 1 Procedure     PT G CodesShanna Cisco, PT [phone] 05/15/2013, 4:08 PM

## 2013-05-15 NOTE — Anesthesia Postprocedure Evaluation (Signed)
Anesthesia Post Note  Patient: Jack [name]  Procedure(s) Performed: Procedure(s) (LRB): AMPUTATION BELOW KNEE (Left)  Anesthesia type: general  Patient location: PACU  Post pain: Pain level controlled  Post assessment: Patient's Cardiovascular Status Stable  Last Vitals:  Filed Vitals:   05/15/13 0646  BP: 111/61  Pulse: 64  Temp:   Resp:     Post vital signs: Reviewed and stable  Level of consciousness: sedated  Complications: No apparent anesthesia complications

## 2013-05-16 LAB — CBC WITH DIFFERENTIAL/PLATELET
BASOS ABS: 0 10*3/uL (ref 0.0–0.1)
BASOS PCT: 0 % (ref 0–1)
EOS PCT: 1 % (ref 0–5)
Eosinophils Absolute: 0.1 10*3/uL (ref 0.0–0.7)
HEMATOCRIT: 30.1 % — AB (ref 39.0–52.0)
Hemoglobin: 9.3 g/dL — ABNORMAL LOW (ref 13.0–17.0)
Lymphocytes Relative: 10 % — ABNORMAL LOW (ref 12–46)
Lymphs Abs: 1.1 10*3/uL (ref 0.7–4.0)
MCH: 26.6 pg (ref 26.0–34.0)
MCHC: 30.9 g/dL (ref 30.0–36.0)
MCV: 86.2 fL (ref 78.0–100.0)
MONO ABS: 1.2 10*3/uL — AB (ref 0.1–1.0)
MONOS PCT: 12 % (ref 3–12)
NEUTROS ABS: 8.1 10*3/uL — AB (ref 1.7–7.7)
Neutrophils Relative %: 77 % (ref 43–77)
Platelets: 307 10*3/uL (ref 150–400)
RBC: 3.49 MIL/uL — ABNORMAL LOW (ref 4.22–5.81)
RDW: 15.3 % (ref 11.5–15.5)
WBC: 10.6 10*3/uL — ABNORMAL HIGH (ref 4.0–10.5)

## 2013-05-16 LAB — RENAL FUNCTION PANEL
ALBUMIN: 2.2 g/dL — AB (ref 3.5–5.2)
BUN: 17 mg/dL (ref 6–23)
CALCIUM: 9.4 mg/dL (ref 8.4–10.5)
CO2: 27 mEq/L (ref 19–32)
CREATININE: 4.66 mg/dL — AB (ref 0.50–1.35)
Chloride: 97 mEq/L (ref 96–112)
GFR calc Af Amer: 13 mL/min — ABNORMAL LOW (ref >90)
GFR, EST NON AFRICAN AMERICAN: 12 mL/min — AB (ref >90)
Glucose, Bld: 88 mg/dL (ref 70–99)
Phosphorus: 3.1 mg/dL (ref 2.3–4.6)
Potassium: 4.8 mEq/L (ref 3.7–5.3)
Sodium: 137 mEq/L (ref 137–147)

## 2013-05-16 LAB — GLUCOSE, CAPILLARY
GLUCOSE-CAPILLARY: 121 mg/dL — AB (ref 70–99)
Glucose-Capillary: 86 mg/dL (ref 70–99)
Glucose-Capillary: 113 mg/dL — ABNORMAL HIGH (ref 70–99)
Glucose-Capillary: 96 mg/dL (ref 70–99)

## 2013-05-16 MED ORDER — VANCOMYCIN HCL IN DEXTROSE 1-5 GM/200ML-% IV SOLN: 1000.0000 mg | INTRAVENOUS | Status: DC

## 2013-05-16 NOTE — Progress Notes (Signed)
Subjective: Interval History: has no complaint, ready to get up.  Objective: Vital signs in last 24 hours: Temp:  [97.1 F (36.2 C)-97.7 F (36.5 C)] 97.1 F (36.2 C) (05/03 0524) Pulse Rate:  [59-75] 74 (05/03 0524) Resp:  [18-20] 18 (05/03 0524) BP: (74-118)/(45-65) 109/50 mmHg (05/03 0524) SpO2:  [93 %-98 %] 98 % (05/03 0524) Weight:  [67.3 kg (148 lb 5.9 oz)-68.8 kg (151 lb 10.8 oz)] 67.3 kg (148 lb 5.9 oz) (05/02 1332) Weight change:   Intake/Output from previous day: 05/02 0701 - 05/03 0700 In: 680 [P.O.:480; IV Piggyback:200] Out: 1441  Intake/Output this shift:    General appearance: alert, cooperative, pale and slowed mentation Resp: diminished breath sounds bilaterally Cardio: S1, S2 normal and systolic murmur: systolic ejection 2/6, decrescendo at 2nd left intercostal space GI: pos bs, soft. liver down 4 cm Extremities: LUA AVF, B&T, R BKA with dressing, L TMA  Lab Results:  Recent Labs  05/15/13 0531 05/16/13 0643  WBC 9.7 10.6*  HGB 8.6* 9.3*  HCT 28.0* 30.1*  PLT 318 307   BMET:  Recent Labs  05/15/13 0929 05/16/13 0643  NA 139 137  K 3.6* 4.8  CL 95* 97  CO2 25 27  GLUCOSE 94 88  BUN 34* 17  CREATININE 7.81* 4.66*  CALCIUM 9.1 9.4   No results found for this basename: PTH,  in the last 72 hours Iron Studies: No results found for this basename: IRON, TIBC, TRANSFERRIN, FERRITIN,  in the last 72 hours  Studies/Results: No results found.  I have reviewed the patient's current medications.  Assessment/Plan: 1 CRF HD TTS, stable vol and solute 2 Anemia stable cont epo 3 DM controlled 4 PVD s/p BKA  Rehab 5 HPTH meds P HD TTS, Rehab, mobilize   LOS: 2 days   Linday Rhodes L Rishik Tubby 05/16/2013,8:57 AM

## 2013-05-16 NOTE — Progress Notes (Signed)
Pharmacy note: vancomycin  68 yo male with history of MRSA bacteremia (from left foot wound) on vancomycin. Vancomycin was started on 04/17/13 and per ID on 04/27/13 plan was for 8 weeks of vancomycin. Noted that current notes indicate end date of 05/28/13 which is about 6 weeks  -Patient is now s/p L foot BKA (05/14/13)  Recommendations -vancomycin length or therapy may need to be adjusted (vancomycin for 8 weeks vs d/c vancomycin now that s/p L BKA?) -Consider a pharmacy consult if you would like pharmacy to monitor and adjust therapy as needed  Thank you, Hildred Laser, Pharm D 05/16/2013 12:47 PM

## 2013-05-16 NOTE — Progress Notes (Signed)
Patient ID: Jack [name], male   DOB: 1945/02/13, 68 y.o.   MRN: [id number] Awake and alert this am.  Vitals stable.  Hgb improved.  Had dialysis yesterday.  Has also worked with PT.  Left BKA stump dressing clean and intact.  Patient appears motivated for continued therapy.  Rehab recommended from a therapy standpoint.

## 2013-05-16 NOTE — Progress Notes (Signed)
Clinical Social Work Department BRIEF PSYCHOSOCIAL ASSESSMENT 05/16/2013  Patient:  Jack [name], Jack [name]     Account Number:  [id number]     Admit date:  05/14/2013  Clinical Social Worker:  Rolinda Roan  Date/Time:  05/16/2013 07:07 PM  Referred by:  Physician  Date Referred:  05/14/2013 Referred for  SNF Placement   Other Referral:   Interview type:  Family Other interview type:    PSYCHOSOCIAL DATA Living Status:  FACILITY Admitted from facility:  [facility] Level of care:  [place] Primary support name:  Jack [name] [phone] Primary support relationship to patient:  CHILD, ADULT Degree of support available:   Good support.    CURRENT CONCERNS  Other Concerns:    SOCIAL WORK ASSESSMENT / PLAN Clinical Social Worker (CSW) contacted patient's daughter Jack [name] because patient is not alert and oriented. Daughter reported that patient is from [facility] and has been there for 1 week. Daughter reported that they would be interested in [place] however patient's insurance turned CIR down in the past. Daughter is agreeable to patient returning to [facility].   Assessment/plan status:  Psychosocial Support/Ongoing Assessment of Needs Other assessment/ plan:   Information/referral to community resources:    PATIENT'S/FAMILY'S RESPONSE TO PLAN OF CARE: Daughter thanked CSW for calling and assisting with getting patient back to [facility].

## 2013-05-16 NOTE — Progress Notes (Signed)
OT Cancellation Note  Patient Details Name: Jack [name] MRN: [id number] DOB: May 18, 1945   Cancelled Treatment:    Reason Eval/Treat Not Completed: Patient declined, no reason specified. Pt seen for OT assessment today. Pt explained living situation and home layout. OT outlined purpose of session and goal to sit EOB. Pt hesitated and stated "I'm just not ready for this yet." OT explained purpose of therapy and benefit of mobilization for increase independence and safety and again pt stated "I'm just not ready." OT asked pt reason for declining therapy and pt stated "Pain and a whole bunch of other things." Pt rated pain as 9/10 but declined notifying the RN for pain medication and asserted that he was declining therapy today. OT will follow-up with pt to complete assessment.   Juluis Rainier [phone] 05/16/2013, 11:43 AM

## 2013-05-17 ENCOUNTER — Encounter (HOSPITAL_COMMUNITY): Payer: Self-pay | Admitting: General Practice

## 2013-05-17 DIAGNOSIS — I739 Peripheral vascular disease, unspecified: Secondary | ICD-10-CM

## 2013-05-17 DIAGNOSIS — J45909 Unspecified asthma, uncomplicated: Secondary | ICD-10-CM | POA: Insufficient documentation

## 2013-05-17 DIAGNOSIS — L98499 Non-pressure chronic ulcer of skin of other sites with unspecified severity: Secondary | ICD-10-CM

## 2013-05-17 DIAGNOSIS — S88119A Complete traumatic amputation at level between knee and ankle, unspecified lower leg, initial encounter: Secondary | ICD-10-CM

## 2013-05-17 DIAGNOSIS — Z89511 Acquired absence of right leg below knee: Secondary | ICD-10-CM

## 2013-05-17 DIAGNOSIS — Z89512 Acquired absence of left leg below knee: Secondary | ICD-10-CM

## 2013-05-17 LAB — CBC WITH DIFFERENTIAL/PLATELET
BASOS ABS: 0 10*3/uL (ref 0.0–0.1)
BASOS PCT: 0 % (ref 0–1)
EOS ABS: 0.1 10*3/uL (ref 0.0–0.7)
EOS PCT: 1 % (ref 0–5)
HCT: 31.1 % — ABNORMAL LOW (ref 39.0–52.0)
Hemoglobin: 9.4 g/dL — ABNORMAL LOW (ref 13.0–17.0)
Lymphocytes Relative: 16 % (ref 12–46)
Lymphs Abs: 1.5 10*3/uL (ref 0.7–4.0)
MCH: 26.3 pg (ref 26.0–34.0)
MCHC: 30.2 g/dL (ref 30.0–36.0)
MCV: 86.9 fL (ref 78.0–100.0)
Monocytes Absolute: 0.9 10*3/uL (ref 0.1–1.0)
Monocytes Relative: 10 % (ref 3–12)
NEUTROS ABS: 6.8 10*3/uL (ref 1.7–7.7)
Neutrophils Relative %: 73 % (ref 43–77)
Platelets: 305 10*3/uL (ref 150–400)
RBC: 3.58 MIL/uL — ABNORMAL LOW (ref 4.22–5.81)
RDW: 15.4 % (ref 11.5–15.5)
WBC: 9.4 10*3/uL (ref 4.0–10.5)

## 2013-05-17 LAB — RENAL FUNCTION PANEL
Albumin: 2.1 g/dL — ABNORMAL LOW (ref 3.5–5.2)
BUN: 26 mg/dL — ABNORMAL HIGH (ref 6–23)
CHLORIDE: 93 meq/L — AB (ref 96–112)
CO2: 24 mEq/L (ref 19–32)
Calcium: 9.2 mg/dL (ref 8.4–10.5)
Creatinine, Ser: 6.71 mg/dL — ABNORMAL HIGH (ref 0.50–1.35)
GFR, EST AFRICAN AMERICAN: 9 mL/min — AB (ref >90)
GFR, EST NON AFRICAN AMERICAN: 7 mL/min — AB (ref >90)
Glucose, Bld: 112 mg/dL — ABNORMAL HIGH (ref 70–99)
PHOSPHORUS: 3.9 mg/dL (ref 2.3–4.6)
Potassium: 4.2 mEq/L (ref 3.7–5.3)
SODIUM: 133 meq/L — AB (ref 137–147)

## 2013-05-17 LAB — GLUCOSE, CAPILLARY
GLUCOSE-CAPILLARY: 114 mg/dL — AB (ref 70–99)
GLUCOSE-CAPILLARY: 121 mg/dL — AB (ref 70–99)
GLUCOSE-CAPILLARY: 117 mg/dL — AB (ref 70–99)
Glucose-Capillary: 89 mg/dL (ref 70–99)

## 2013-05-17 MED ORDER — LIDOCAINE HCL (PF) 1 % IJ SOLN: 5.0000 mL | INTRAMUSCULAR | Status: DC | PRN

## 2013-05-17 MED ORDER — SODIUM CHLORIDE 0.9 % IV SOLN: 100.0000 mL | INTRAVENOUS | Status: DC | PRN

## 2013-05-17 MED ORDER — PENTAFLUOROPROP-TETRAFLUOROETH EX AERO: 1.0000 "application " | INHALATION_SPRAY | CUTANEOUS | Status: DC | PRN

## 2013-05-17 MED ORDER — NEPRO/CARBSTEADY PO LIQD
237.0000 mL | ORAL | Status: DC | PRN
  Filled 2013-05-17: qty 237

## 2013-05-17 MED ORDER — HEPARIN SODIUM (PORCINE) 1000 UNIT/ML DIALYSIS
1000.0000 [IU] | INTRAMUSCULAR | Status: DC | PRN
  Filled 2013-05-17: qty 1

## 2013-05-17 MED ORDER — VANCOMYCIN HCL IN DEXTROSE 1-5 GM/200ML-% IV SOLN
1000.0000 mg | INTRAVENOUS | Status: DC
  Administered 2013-05-17: 1000 mg via INTRAVENOUS
  Filled 2013-05-17 (×2): qty 200

## 2013-05-17 MED ORDER — METHOCARBAMOL 500 MG PO TABS: 500.0000 mg | ORAL_TABLET | Freq: Four times a day (QID) | ORAL | Status: DC | PRN

## 2013-05-17 MED ORDER — ALTEPLASE 2 MG IJ SOLR
2.0000 mg | Freq: Once | INTRAMUSCULAR | Status: AC | PRN
  Filled 2013-05-17: qty 2

## 2013-05-17 MED ORDER — HEPARIN SODIUM (PORCINE) 1000 UNIT/ML DIALYSIS
20.0000 [IU]/kg | Freq: Once | INTRAMUSCULAR | Status: DC
  Filled 2013-05-17: qty 2

## 2013-05-17 MED ORDER — LIDOCAINE-PRILOCAINE 2.5-2.5 % EX CREA
1.0000 "application " | TOPICAL_CREAM | CUTANEOUS | Status: DC | PRN
  Filled 2013-05-17: qty 5

## 2013-05-17 MED ORDER — OXYCODONE HCL 5 MG PO TABS
5.0000 mg | ORAL_TABLET | Freq: Four times a day (QID) | ORAL | Status: DC | PRN
Start: 2013-05-17 — End: 2013-05-28

## 2013-05-17 NOTE — Progress Notes (Signed)
  [facility] Progress Note   Subjective: No complaints  Filed Vitals:   05/16/13 0524 05/16/13 1332 05/16/13 2015 05/17/13 0505  BP: 109/50 118/55 113/58 132/50  Pulse: 74 70 62 64  Temp: 97.1 F (36.2 C) 98.7 F (37.1 C) 97.4 F (36.3 C) 97.7 F (36.5 C)  TempSrc: Oral Oral Oral Oral  Resp: 18 20 18 19   Weight:      SpO2: 98% 100% 100% 100%   Exam: Alert, pale, slow mentation Resp dec'd BS bilat RRR 2/6 SEM Abd soft, NTND LUA AVF BVT, L BKA w dressing, R foot TMA (old) Neuro is alert, Ox3  HD: MWF [place] 4h  85.5kg  Heparin 8700   L BVT Hect 1 tiw        Assessment: 1 PVD s/p L BKA 2 ESRD stable vol/solute 3 Anemia stable on epo 4 HTPH cont meds 5 DM  Plan- HD today. Dispo to CIR vs return to SNF, awaiting insurance response    [name] MD  pager [phone]    cell [phone]  05/17/2013, 9:07 AM     Recent Labs Lab 05/14/13 1010 05/15/13 0929 05/16/13 0643  NA 137 139 137  K 3.5* 3.6* 4.8  CL 94* 95* 97  CO2 22 25 27   GLUCOSE 182* 94 88  BUN 27* 34* 17  CREATININE 6.32* 7.81* 4.66*  CALCIUM 9.2 9.1 9.4  PHOS  --  5.1* 3.1    Recent Labs Lab 05/14/13 1010 05/15/13 0929 05/16/13 0643  AST 12  --   --   ALT 9  --   --   ALKPHOS 87  --   --   BILITOT 0.7  --   --   PROT 7.6  --   --   ALBUMIN 2.3* 2.1* 2.2*    Recent Labs Lab 05/15/13 0531 05/16/13 0643 05/17/13 0633  WBC 9.7 10.6* 9.4  NEUTROABS 7.4 8.1* 6.8  HGB 8.6* 9.3* 9.4*  HCT 28.0* 30.1* 31.1*  MCV 85.6 86.2 86.9  PLT 318 307 305   . calcium acetate  667 mg Oral TID WC  . colchicine  0.3 mg Oral Daily  . doxazosin  2 mg Oral QHS  . levothyroxine  175 mcg Oral QAC breakfast  . multivitamin  1 tablet Oral QHS  . nebivolol  5 mg Oral QHS  . [START ON 05/18/2013] vancomycin  1,000 mg Intravenous Q T,Th,Sa-HD   . sodium chloride 10 mL/hr (05/14/13 1807)   HYDROmorphone (DILAUDID) injection, loperamide, methocarbamol (ROBAXIN) IV, methocarbamol,  metoCLOPramide (REGLAN) injection, metoCLOPramide, ondansetron (ZOFRAN) IV, ondansetron, oxyCODONE-acetaminophen

## 2013-05-17 NOTE — Progress Notes (Signed)
OT Cancellation Note  Patient Details Name: Jack [name] MRN: [id number] DOB: 25-Feb-1945   Cancelled Treatment:    Reason Eval/Treat Not Completed: Patient at procedure or test/ unavailable (HD currently - will remain in HD until after 4PM)  Peri Maris Pager: [phone]  05/17/2013, 1:43 PM

## 2013-05-17 NOTE — Discharge Instructions (Signed)
Change dressing as needed

## 2013-05-17 NOTE — Discharge Summary (Signed)
Physician Discharge Summary  Patient ID: Jack [name] MRN: [id number] DOB/AGE: 1945/11/30 68 y.o.  Admit date: 05/14/2013 Discharge date: 05/17/2013  Admission Diagnoses: Osteomyelitis abscess ulceration left  Discharge Diagnoses: Same Active Problems:   S/P BKA (below knee amputation) unilateral   Discharged Condition: stable  Hospital Course: Patient's hospital course was essentially unremarkable. He underwent a left transtibial amputation. Postoperatively patient progressed slowly with therapy and was discharged to skilled nursing in stable condition  Consults: None  Significant Diagnostic Studies: labs: Routine labs  Treatments: surgery: See operative note  Discharge Exam: Blood pressure 132/50, pulse 64, temperature 97.7 F (36.5 C), temperature source Oral, resp. rate 19, weight 67.3 kg (148 lb 5.9 oz), SpO2 100.00%. Incision/Wound: dressing clean dry and intact  Disposition: 03-Skilled Nursing Facility  Discharge Orders   Future Appointments Provider Department Dept Phone   06/01/2013 10:30 AM Truman Hayward, MD [facility] for Infectious Disease [phone]   06/29/2013 9:00 AM [facility] [phone]   06/29/2013 9:30 AM Wl-Dg 4 (Chest) [facility] [phone]   07/06/2013 9:00 AM [name], MD [facility] [phone]   Future Orders Complete By Expires   Call MD / Call 911  As directed    Constipation Prevention  As directed    Diet - low sodium heart healthy  As directed    Increase activity slowly as tolerated  As directed        Medication List    STOP taking these medications       vancomycin 1 GM/200ML Soln  Commonly known as:  VANCOCIN      TAKE these medications       aspirin EC 81 MG tablet  Take 81 mg by mouth daily.     multivitamin Tabs tablet  Take 1 tablet by mouth at bedtime.     b  complex-vitamin c-folic acid 0.8 MG Tabs tablet  Take 1 tablet by mouth daily.     calcium acetate 667 MG capsule  Commonly known as:  PHOSLO  Take 1 capsule (667 mg total) by mouth 3 (three) times daily with meals.     colchicine 0.6 MG tablet  Take 0.5 tablets (0.3 mg total) by mouth daily.     doxazosin 2 MG tablet  Commonly known as:  CARDURA  Take 2 mg by mouth at bedtime.     doxercalciferol 4 MCG/2ML injection  Commonly known as:  HECTOROL  Inject 0.5 mLs (1 mcg total) into the vein every Monday, Wednesday, and Friday with hemodialysis.     feeding supplement (RESOURCE BREEZE) Liqd  Take 1 Container by mouth 3 (three) times daily between meals.     levothyroxine 175 MCG tablet  Commonly known as:  SYNTHROID, LEVOTHROID  Take 175 mcg by mouth daily before breakfast.     loperamide 2 MG capsule  Commonly known as:  IMODIUM  Take 1 capsule (2 mg total) by mouth as needed for diarrhea or loose stools.     methocarbamol 500 MG tablet  Commonly known as:  ROBAXIN  Take 1 tablet (500 mg total) by mouth every 6 (six) hours as needed for muscle spasms.     nebivolol 5 MG tablet  Commonly known as:  BYSTOLIC  Take 5 mg by mouth at bedtime.     ONGLYZA 5 MG Tabs tablet  Generic drug:  saxagliptin HCl  Take 5 mg by mouth daily.  oxyCODONE 5 MG immediate release tablet  Commonly known as:  Oxy IR/ROXICODONE  Take 1 tablet (5 mg total) by mouth every 6 (six) hours as needed for severe pain.     sodium chloride 0.9 % SOLN 100 mL with ferric gluconate 12.5 MG/ML SOLN 125 mg  Inject 125 mg into the vein every Monday, Wednesday, and Friday with hemodialysis.           Follow-up Information   Follow up with Tymar Polyak V, MD In 2 weeks.   Specialty:  Orthopedic Surgery   Contact information:   [place] [address] [phone]       Signed: Newt Minion 05/17/2013, 6:29 AM

## 2013-05-17 NOTE — Consult Note (Signed)
Physical Medicine and Rehabilitation Consult  Reason for Consult: Left BKA and h/o Right transmetatarsal  amputation.  Referring Physician: Dr. Sharol Given   HPI: Jack [name] is a 68 y.o. male with h/o of MM, ESRD-HD, DM type 2 with insensate neuropathy, recent MRSA bacteremia and poorly healing L-transmet amputation wound with progressive gangrenous changes. Was discharged to [facility] place for therapy and antibiotics. He continued to have poor wound healing and was admitted on 05/14/13 for L-BKA by Dr. Sharol Given. Post op HD ongoing and transfused of acute on chronic anemia. To continue IV vancomycin through 05/28/13 for bacteremia from foot wound. Therapy evaluation done this weekend and CIR recommended by rehab team.    Review of Systems  HENT: Negative for hearing loss.   Eyes: Negative for blurred vision and double vision.  Respiratory: Negative for cough and shortness of breath.   Cardiovascular: Negative for chest pain and palpitations.  Gastrointestinal: Negative for heartburn and abdominal pain.  Musculoskeletal: Positive for joint pain and myalgias.  Neurological: Positive for dizziness. Negative for headaches.     Past Medical History  Diagnosis Date  . Hypertension   . Shortness of breath   . Pneumonia     2012  . Heart murmur   . Glaucoma   . Multiple myeloma, without mention of having achieved remission 03/30/2012    Cytogenetic neg on 03/23/2012.  . Asthma   . Hyperparathyroidism, secondary renal   . Peripheral arterial disease   . End stage renal disease on dialysis     Stage 5  . Diabetes mellitus without complication     Type 1  . Thyroid disease     hyperparathyroidism  . MRSA bacteremia   . Anemia    Past Surgical History  Procedure Laterality Date  . Thyroidectomy    . Cervical disc surgery    . Eye surgery      CATARACTS  . Insertion of dialysis catheter Right 03/19/2012    Procedure: INSERTION OF DIALYSIS CATHETER;  Surgeon: Mal Misty, MD;   Location: [place];  Service: Vascular;  Laterality: Right;  Right Internal Jugular  . Av fistula placement Left 03/25/2012    Procedure: ARTERIOVENOUS (AV) FISTULA CREATION;  Surgeon: Mal Misty, MD;  Location: [place];  Service: Vascular;  Laterality: Left;  . Ligation of competing branches of arteriovenous fistula Left 05/08/2012    Procedure: LIGATION OF COMPETING BRANCHES OF ARTERIOVENOUS FISTULA;  Surgeon: Mal Misty, MD;  Location: [place];  Service: Vascular;  Laterality: Left;  Ultrasound guided  . Cardiac catheterization      approx 30 years ago  . Amputation Right 11/10/2012    Procedure: AMPUTATION FIRST and SECOND TOES Right Foot;  Surgeon: Elam Dutch, MD;  Location[facility] OR;  Service: Vascular;  Laterality: Right;  . Transmetatarsal amputation Left 12/16/2012    Procedure: TRANSMETATARSAL AMPUTATION AND VAC PLACEMENT;  Surgeon: Elam Dutch, MD;  Location: [place];  Service: Vascular;  Laterality: Left;  . Amputation Left 04/07/2013    Procedure: AMPUTATION DIGIT- LEFT 1ST TOE;  Surgeon: Mal Misty, MD;  Location: [place];  Service: Vascular;  Laterality: Left;  . Tee without cardioversion N/A 04/20/2013    Procedure: TRANSESOPHAGEAL ECHOCARDIOGRAM (TEE);  Surgeon: Josue Hector, MD;  Location: [facility] ENDOSCOPY;  Service: Cardiovascular;  Laterality: N/A;  . I&d extremity Left 04/22/2013    Procedure: WNIOEVOJJK KXF DEBRIDEMENT LEFT FIRST TOE AMPUTATION WOUND ;  Surgeon: Mal Misty, MD;  Location: [facility] OR;  Service: Vascular;  Laterality: Left;  . Amputation Left 04/26/2013    Procedure: Left Foot Transmetatarsal Amputation;  Surgeon: Newt Minion, MD;  Location: [place];  Service: Orthopedics;  Laterality: Left;  . Toe amputation      D/C 04-30-13   Family History  Problem Relation Age of Onset  . Diabetes Mother   . Cancer Mother     bone   . Kidney disease Father     Social History:  Lives with family. Independent prior to 04/2013. He reports that he quit smoking about 31  years ago. His smoking use included Cigarettes. He smoked 0.00 packs per day. He has never used smokeless tobacco. He reports that he does not drink alcohol or use illicit drugs.   Allergies  Allergen Reactions  . Bee Venom Anaphylaxis  . Lisinopril Cough  . Ivp Dye [Iodinated Diagnostic Agents] Hives  . Morphine And Related Other (See Comments)    Bradycardia states patient    Medications Prior to Admission  Medication Sig Dispense Refill  . aspirin EC 81 MG tablet Take 81 mg by mouth daily.      Marland Kitchen b complex-vitamin c-folic acid (NEPHRO-VITE) 0.8 MG TABS tablet Take 1 tablet by mouth daily.      . calcium acetate (PHOSLO) 667 MG capsule Take 1 capsule (667 mg total) by mouth 3 (three) times daily with meals.  90 capsule  0  . colchicine 0.6 MG tablet Take 0.5 tablets (0.3 mg total) by mouth daily.      Marland Kitchen doxazosin (CARDURA) 2 MG tablet Take 2 mg by mouth at bedtime.      Marland Kitchen doxercalciferol (HECTOROL) 4 MCG/2ML injection Inject 0.5 mLs (1 mcg total) into the vein every Monday, Wednesday, and Friday with hemodialysis.  2 mL    . feeding supplement, RESOURCE BREEZE, (RESOURCE BREEZE) LIQD Take 1 Container by mouth 3 (three) times daily between meals.    0  . levothyroxine (SYNTHROID, LEVOTHROID) 175 MCG tablet Take 175 mcg by mouth daily before breakfast.      . multivitamin (RENA-VIT) TABS tablet Take 1 tablet by mouth at bedtime.  30 tablet  0  . nebivolol (BYSTOLIC) 5 MG tablet Take 5 mg by mouth at bedtime.      . saxagliptin HCl (ONGLYZA) 5 MG TABS tablet Take 5 mg by mouth daily.       . sodium chloride 0.9 % SOLN 100 mL with ferric gluconate 12.5 MG/ML SOLN 125 mg Inject 125 mg into the vein every Monday, Wednesday, and Friday with hemodialysis.      Marland Kitchen vancomycin (VANCOCIN) 1 GM/200ML SOLN Inject 200 mLs (1,000 mg total) into the vein every Monday, Wednesday, and Friday with hemodialysis. Will be stopped by infectious disease  4000 mL    . [DISCONTINUED] oxyCODONE (OXY IR/ROXICODONE) 5  MG immediate release tablet Take 1 tablet (5 mg total) by mouth every 6 (six) hours as needed for severe pain.  10 tablet  0  . loperamide (IMODIUM) 2 MG capsule Take 1 capsule (2 mg total) by mouth as needed for diarrhea or loose stools.  30 capsule  0    Home: Home Living Family/patient expects to be discharged to:: Private residence Living Arrangements: Children Available Help at Discharge: Family;Available 24 hours/day Type of Home: Apartment Home Access: Level entry Home Layout: One level Home Equipment: Walker - 2 wheels;Cane - single point;Shower seat;Wheelchair - manual  Functional History: Prior Function Level of Independence: Needs assistance Gait /  Transfers Assistance Needed: patient reports he needed assistance to get to w/c ADL's / Homemaking Assistance Needed: Assist with bathing and dressing Functional Status:  Mobility: Bed Mobility Overal bed mobility: Needs Assistance Bed Mobility: Supine to Sit;Sit to Supine;Rolling Rolling: Modified independent (Device/Increase time) Supine to sit: Min assist Sit to supine: Min assist General bed mobility comments: used railing; assistance given to raise shoulders off bed        ADL:    Cognition: Cognition Overall Cognitive Status: No family/caregiver present to determine baseline cognitive functioning Orientation Level: Oriented to person;Oriented to situation;Disoriented to person Cognition Arousal/Alertness: Lethargic;Suspect due to medications (or HD today) Behavior During Therapy: Flat affect Overall Cognitive Status: No family/caregiver present to determine baseline cognitive functioning Area of Impairment: Following commands;Problem solving Following Commands: Follows one step commands inconsistently Problem Solving: Slow processing;Decreased initiation  Blood pressure 132/50, pulse 64, temperature 97.7 F (36.5 C), temperature source Oral, resp. rate 19, weight 67.3 kg (148 lb 5.9 oz), SpO2  100.00%. Physical Exam  Nursing note and vitals reviewed. Constitutional: He is oriented to person, place, and time.  Thin male--kept eyes closed thorough the exam with minimal interaction.   HENT:  Head: Normocephalic and atraumatic.  Eyes: Conjunctivae are normal.  Neck: Normal range of motion. Neck supple.  Cardiovascular: Normal rate and regular rhythm.   Respiratory: Effort normal and breath sounds normal.  GI: Soft. Bowel sounds are normal.  Musculoskeletal: He exhibits tenderness (left BKA site. ).  Well healed right transmet site. Left hand with thenar atrophy and mild contracture. Left BKA with compressive dressing and tends to keep flexed.   Neurological: He is oriented to person, place, and time.  Flat affect. A little confused.  Follows basic commands without difficulty. Stocking-glove peripheral neuropathy. RLE weakness noted.   Skin: Skin is warm and dry.  Psychiatric: His affect is blunt. He is withdrawn. He is noncommunicative.    Results for orders placed during the hospital encounter of 05/14/13 (from the past 24 hour(s))  GLUCOSE, CAPILLARY     Status: Abnormal   Collection Time    05/16/13 12:13 PM      Result Value Ref Range   Glucose-Capillary 121 (*) 70 - 99 mg/dL  GLUCOSE, CAPILLARY     Status: Abnormal   Collection Time    05/16/13  4:37 PM      Result Value Ref Range   Glucose-Capillary 113 (*) 70 - 99 mg/dL  GLUCOSE, CAPILLARY     Status: None   Collection Time    05/16/13 10:42 PM      Result Value Ref Range   Glucose-Capillary 96  70 - 99 mg/dL   Comment 1 Documented in Chart     Comment 2 Notify RN    GLUCOSE, CAPILLARY     Status: None   Collection Time    05/17/13  6:11 AM      Result Value Ref Range   Glucose-Capillary 89  70 - 99 mg/dL   Comment 1 Documented in Chart     Comment 2 Notify RN    CBC WITH DIFFERENTIAL     Status: Abnormal   Collection Time    05/17/13  6:33 AM      Result Value Ref Range   WBC 9.4  4.0 - 10.5 K/uL   RBC  3.58 (*) 4.22 - 5.81 MIL/uL   Hemoglobin 9.4 (*) 13.0 - 17.0 g/dL   HCT 31.1 (*) 39.0 - 52.0 %   MCV 86.9  78.0 -  100.0 fL   MCH 26.3  26.0 - 34.0 pg   MCHC 30.2  30.0 - 36.0 g/dL   RDW 15.4  11.5 - 15.5 %   Platelets 305  150 - 400 K/uL   Neutrophils Relative % 73  43 - 77 %   Neutro Abs 6.8  1.7 - 7.7 K/uL   Lymphocytes Relative 16  12 - 46 %   Lymphs Abs 1.5  0.7 - 4.0 K/uL   Monocytes Relative 10  3 - 12 %   Monocytes Absolute 0.9  0.1 - 1.0 K/uL   Eosinophils Relative 1  0 - 5 %   Eosinophils Absolute 0.1  0.0 - 0.7 K/uL   Basophils Relative 0  0 - 1 %   Basophils Absolute 0.0  0.0 - 0.1 K/uL   No results found.  Assessment/Plan: Diagnosis: left BKA 1. Does the need for close, 24 hr/day medical supervision in concert with the patient's rehab needs make it unreasonable for this patient to be served in a less intensive setting? Yes 2. Co-Morbidities requiring supervision/potential complications: htn, anemia, ESRD 3. Due to bowel management, safety, skin/wound care, disease management, medication administration, pain management and patient education, does the patient require 24 hr/day rehab nursing? Yes 4. Does the patient require coordinated care of a physician, rehab nurse, PT (1-2 hrs/day, 5 days/week) and OT (1-2 hrs/day, 5 days/week) to address physical and functional deficits in the context of the above medical diagnosis(es)? Yes Addressing deficits in the following areas: balance, endurance, locomotion, strength, transferring, bowel/bladder control, bathing, dressing, feeding, grooming, toileting and psychosocial support 5. Can the patient actively participate in an intensive therapy program of at least 3 hrs of therapy per day at least 5 days per week? Yes 6. The potential for patient to make measurable gains while on inpatient rehab is excellent 7. Anticipated functional outcomes upon discharge from inpatient rehab are modified independent  with PT, modified independent with  OT, n/a with SLP. 8. Estimated rehab length of stay to reach the above functional goals is: 7-9 days 9. Does the patient have adequate social supports to accommodate these discharge functional goals? Yes 10. Anticipated D/C setting: Home 11. Anticipated post D/C treatments: [place] therapy 12. Overall Rehab/Functional Prognosis: excellent  RECOMMENDATIONS: This patient's condition is appropriate for continued rehabilitative care in the following setting: CIR Patient has agreed to participate in recommended program. Yes Note that insurance prior authorization may be required for reimbursement for recommended care.  Comment: Rehab Admissions Coordinator to follow up.  Thanks,  Meredith Staggers, MD, Mellody Drown     05/17/2013

## 2013-05-17 NOTE — Progress Notes (Signed)
CSW and Genie from [employer], spoke with patient's daughter by bedside about possible CIR admission vs. Going back to [facility]. Patient's daughter states she really wants CIR and then wants to take patient back home. CSW continuing to follow, waiting on CIR to see if they are able to take patient.  Jeanette Caprice, MSW, [place]

## 2013-05-18 ENCOUNTER — Encounter (HOSPITAL_COMMUNITY): Payer: Self-pay | Admitting: Orthopedic Surgery

## 2013-05-18 ENCOUNTER — Inpatient Hospital Stay (HOSPITAL_COMMUNITY)
Admission: RE | Admit: 2013-05-18 | Discharge: 2013-05-29 | DRG: 945 | Disposition: A | Discharge status: Home Health | Payer: Medicare Other | Source: Intra-hospital | Attending: Physical Medicine & Rehabilitation | Admitting: Physical Medicine & Rehabilitation

## 2013-05-18 DIAGNOSIS — I739 Peripheral vascular disease, unspecified: Secondary | ICD-10-CM | POA: Diagnosis present

## 2013-05-18 DIAGNOSIS — G547 Phantom limb syndrome without pain: Secondary | ICD-10-CM | POA: Diagnosis not present

## 2013-05-18 DIAGNOSIS — Z5189 Encounter for other specified aftercare: Principal | ICD-10-CM

## 2013-05-18 DIAGNOSIS — Z794 Long term (current) use of insulin: Secondary | ICD-10-CM | POA: Diagnosis not present

## 2013-05-18 DIAGNOSIS — N2581 Secondary hyperparathyroidism of renal origin: Secondary | ICD-10-CM | POA: Diagnosis present

## 2013-05-18 DIAGNOSIS — I10 Essential (primary) hypertension: Secondary | ICD-10-CM

## 2013-05-18 DIAGNOSIS — T879 Unspecified complications of amputation stump: Secondary | ICD-10-CM | POA: Diagnosis present

## 2013-05-18 DIAGNOSIS — R7881 Bacteremia: Secondary | ICD-10-CM | POA: Diagnosis present

## 2013-05-18 DIAGNOSIS — I120 Hypertensive chronic kidney disease with stage 5 chronic kidney disease or end stage renal disease: Secondary | ICD-10-CM | POA: Diagnosis present

## 2013-05-18 DIAGNOSIS — N186 End stage renal disease: Secondary | ICD-10-CM

## 2013-05-18 DIAGNOSIS — Z992 Dependence on renal dialysis: Secondary | ICD-10-CM

## 2013-05-18 DIAGNOSIS — Z7982 Long term (current) use of aspirin: Secondary | ICD-10-CM

## 2013-05-18 DIAGNOSIS — G589 Mononeuropathy, unspecified: Secondary | ICD-10-CM | POA: Diagnosis not present

## 2013-05-18 DIAGNOSIS — S88119A Complete traumatic amputation at level between knee and ankle, unspecified lower leg, initial encounter: Secondary | ICD-10-CM | POA: Diagnosis not present

## 2013-05-18 DIAGNOSIS — E1151 Type 2 diabetes mellitus with diabetic peripheral angiopathy without gangrene: Secondary | ICD-10-CM | POA: Diagnosis present

## 2013-05-18 DIAGNOSIS — I96 Gangrene, not elsewhere classified: Secondary | ICD-10-CM | POA: Diagnosis present

## 2013-05-18 DIAGNOSIS — D631 Anemia in chronic kidney disease: Secondary | ICD-10-CM

## 2013-05-18 DIAGNOSIS — E1159 Type 2 diabetes mellitus with other circulatory complications: Secondary | ICD-10-CM | POA: Diagnosis present

## 2013-05-18 DIAGNOSIS — A4902 Methicillin resistant Staphylococcus aureus infection, unspecified site: Secondary | ICD-10-CM | POA: Diagnosis present

## 2013-05-18 DIAGNOSIS — Z8614 Personal history of Methicillin resistant Staphylococcus aureus infection: Secondary | ICD-10-CM | POA: Diagnosis not present

## 2013-05-18 DIAGNOSIS — Z87891 Personal history of nicotine dependence: Secondary | ICD-10-CM

## 2013-05-18 DIAGNOSIS — C9000 Multiple myeloma not having achieved remission: Secondary | ICD-10-CM | POA: Diagnosis present

## 2013-05-18 DIAGNOSIS — Z89511 Acquired absence of right leg below knee: Secondary | ICD-10-CM

## 2013-05-18 DIAGNOSIS — I959 Hypotension, unspecified: Secondary | ICD-10-CM | POA: Diagnosis present

## 2013-05-18 DIAGNOSIS — N039 Chronic nephritic syndrome with unspecified morphologic changes: Secondary | ICD-10-CM

## 2013-05-18 DIAGNOSIS — L98499 Non-pressure chronic ulcer of skin of other sites with unspecified severity: Secondary | ICD-10-CM

## 2013-05-18 DIAGNOSIS — D638 Anemia in other chronic diseases classified elsewhere: Secondary | ICD-10-CM | POA: Diagnosis present

## 2013-05-18 DIAGNOSIS — J45909 Unspecified asthma, uncomplicated: Secondary | ICD-10-CM | POA: Diagnosis present

## 2013-05-18 DIAGNOSIS — Z89512 Acquired absence of left leg below knee: Secondary | ICD-10-CM

## 2013-05-18 LAB — GLUCOSE, CAPILLARY
GLUCOSE-CAPILLARY: 137 mg/dL — AB (ref 70–99)
Glucose-Capillary: 90 mg/dL (ref 70–99)
Glucose-Capillary: 102 mg/dL — ABNORMAL HIGH (ref 70–99)
Glucose-Capillary: 108 mg/dL — ABNORMAL HIGH (ref 70–99)

## 2013-05-18 MED ORDER — SODIUM CHLORIDE 0.9 % IV SOLN: 100.0000 mL | INTRAVENOUS | Status: DC | PRN

## 2013-05-18 MED ORDER — LEVOTHYROXINE SODIUM 175 MCG PO TABS
175.0000 ug | ORAL_TABLET | Freq: Every day | ORAL | Status: DC
  Administered 2013-05-19 – 2013-05-29 (×11): 175 ug via ORAL
  Filled 2013-05-18 (×12): qty 1

## 2013-05-18 MED ORDER — PENTAFLUOROPROP-TETRAFLUOROETH EX AERO: 1.0000 "application " | INHALATION_SPRAY | CUTANEOUS | Status: DC | PRN

## 2013-05-18 MED ORDER — OXYCODONE-ACETAMINOPHEN 5-325 MG PO TABS
1.0000 | ORAL_TABLET | ORAL | Status: DC | PRN
  Administered 2013-05-18: 2 via ORAL
  Administered 2013-05-19 – 2013-05-21 (×4): 1 via ORAL
  Administered 2013-05-22 – 2013-05-24 (×2): 2 via ORAL
  Administered 2013-05-25 – 2013-05-27 (×4): 1 via ORAL
  Administered 2013-05-28: 2 via ORAL
  Filled 2013-05-18: qty 2
  Filled 2013-05-18 (×5): qty 1
  Filled 2013-05-18: qty 2
  Filled 2013-05-18 (×4): qty 1
  Filled 2013-05-18 (×2): qty 2
  Filled 2013-05-18: qty 1

## 2013-05-18 MED ORDER — COLCHICINE 0.6 MG PO TABS
0.3000 mg | ORAL_TABLET | Freq: Every day | ORAL | Status: DC
  Administered 2013-05-19 – 2013-05-29 (×11): 0.3 mg via ORAL
  Filled 2013-05-18 (×12): qty 0.5

## 2013-05-18 MED ORDER — HEPARIN SODIUM (PORCINE) 1000 UNIT/ML DIALYSIS
1000.0000 [IU] | INTRAMUSCULAR | Status: DC | PRN
  Filled 2013-05-18: qty 1

## 2013-05-18 MED ORDER — TRAZODONE HCL 50 MG PO TABS: 25.0000 mg | ORAL_TABLET | Freq: Every evening | ORAL | Status: DC | PRN

## 2013-05-18 MED ORDER — HEPARIN SODIUM (PORCINE) 1000 UNIT/ML DIALYSIS
3000.0000 [IU] | INTRAMUSCULAR | Status: DC | PRN
  Filled 2013-05-18: qty 3

## 2013-05-18 MED ORDER — LIDOCAINE-PRILOCAINE 2.5-2.5 % EX CREA
1.0000 "application " | TOPICAL_CREAM | CUTANEOUS | Status: DC | PRN
  Filled 2013-05-18: qty 5

## 2013-05-18 MED ORDER — SORBITOL 70 % SOLN
30.0000 mL | Freq: Every day | Status: DC | PRN
  Administered 2013-05-21 – 2013-05-25 (×3): 30 mL via ORAL
  Filled 2013-05-18 (×5): qty 30

## 2013-05-18 MED ORDER — OXYCODONE HCL 5 MG PO TABS
10.0000 mg | ORAL_TABLET | Freq: Two times a day (BID) | ORAL | Status: DC
  Administered 2013-05-19 – 2013-05-29 (×18): 10 mg via ORAL
  Filled 2013-05-18 (×20): qty 2

## 2013-05-18 MED ORDER — ALUMINUM HYDROXIDE GEL 320 MG/5ML PO SUSP
30.0000 mL | Freq: Four times a day (QID) | ORAL | Status: DC | PRN
  Filled 2013-05-18: qty 30

## 2013-05-18 MED ORDER — GABAPENTIN 100 MG PO CAPS
100.0000 mg | ORAL_CAPSULE | Freq: Every day | ORAL | Status: DC
  Administered 2013-05-18 – 2013-05-23 (×4): 100 mg via ORAL
  Filled 2013-05-18 (×8): qty 1

## 2013-05-18 MED ORDER — RENA-VITE PO TABS
1.0000 | ORAL_TABLET | Freq: Every day | ORAL | Status: DC
  Administered 2013-05-18 – 2013-05-19 (×2): via ORAL
  Administered 2013-05-20 – 2013-05-28 (×9): 1 via ORAL
  Filled 2013-05-18 (×12): qty 1

## 2013-05-18 MED ORDER — NEPRO/CARBSTEADY PO LIQD: 237.0000 mL | ORAL | Status: DC | PRN

## 2013-05-18 MED ORDER — METOCLOPRAMIDE HCL 5 MG/ML IJ SOLN
5.0000 mg | Freq: Three times a day (TID) | INTRAMUSCULAR | Status: DC | PRN
  Filled 2013-05-18: qty 2

## 2013-05-18 MED ORDER — ENOXAPARIN SODIUM 30 MG/0.3ML ~~LOC~~ SOLN
30.0000 mg | SUBCUTANEOUS | Status: DC
  Administered 2013-05-19 – 2013-05-28 (×7): 30 mg via SUBCUTANEOUS
  Filled 2013-05-18 (×12): qty 0.3

## 2013-05-18 MED ORDER — ONDANSETRON HCL 4 MG/2ML IJ SOLN: 4.0000 mg | Freq: Four times a day (QID) | INTRAMUSCULAR | Status: DC | PRN

## 2013-05-18 MED ORDER — LIDOCAINE HCL (PF) 1 % IJ SOLN
5.0000 mL | INTRAMUSCULAR | Status: DC | PRN
  Filled 2013-05-18: qty 5

## 2013-05-18 MED ORDER — GUAIFENESIN-DM 100-10 MG/5ML PO SYRP: 5.0000 mL | ORAL_SOLUTION | Freq: Four times a day (QID) | ORAL | Status: DC | PRN

## 2013-05-18 MED ORDER — DARBEPOETIN ALFA-POLYSORBATE 60 MCG/0.3ML IJ SOLN: 60.0000 ug | INTRAMUSCULAR | Status: DC

## 2013-05-18 MED ORDER — CALCIUM ACETATE 667 MG PO CAPS
667.0000 mg | ORAL_CAPSULE | Freq: Three times a day (TID) | ORAL | Status: DC
  Administered 2013-05-19 (×2): 667 mg via ORAL
  Filled 2013-05-18 (×4): qty 1

## 2013-05-18 MED ORDER — DARBEPOETIN ALFA-POLYSORBATE 60 MCG/0.3ML IJ SOLN
60.0000 ug | INTRAMUSCULAR | Status: DC
  Administered 2013-05-21 – 2013-05-28 (×2): 60 ug via INTRAVENOUS
  Filled 2013-05-18 (×2): qty 0.3

## 2013-05-18 MED ORDER — VANCOMYCIN HCL IN DEXTROSE 1-5 GM/200ML-% IV SOLN
1000.0000 mg | INTRAVENOUS | Status: DC
  Administered 2013-05-19 – 2013-05-28 (×5): 1000 mg via INTRAVENOUS
  Filled 2013-05-18 (×10): qty 200

## 2013-05-18 MED ORDER — METOCLOPRAMIDE HCL 5 MG PO TABS
5.0000 mg | ORAL_TABLET | Freq: Three times a day (TID) | ORAL | Status: DC | PRN
  Filled 2013-05-18: qty 2

## 2013-05-18 MED ORDER — METHOCARBAMOL 500 MG PO TABS
500.0000 mg | ORAL_TABLET | Freq: Four times a day (QID) | ORAL | Status: DC | PRN
  Administered 2013-05-23: 500 mg via ORAL
  Filled 2013-05-18: qty 1

## 2013-05-18 MED ORDER — FLEET ENEMA 7-19 GM/118ML RE ENEM: 1.0000 | ENEMA | Freq: Once | RECTAL | Status: AC | PRN

## 2013-05-18 MED ORDER — METHOCARBAMOL 1000 MG/10ML IJ SOLN: 500.0000 mg | Freq: Four times a day (QID) | INTRAMUSCULAR | Status: DC | PRN

## 2013-05-18 MED ORDER — HEPARIN SODIUM (PORCINE) 1000 UNIT/ML DIALYSIS
20.0000 [IU]/kg | Freq: Once | INTRAMUSCULAR | Status: DC
  Filled 2013-05-18: qty 2

## 2013-05-18 MED ORDER — ALTEPLASE 2 MG IJ SOLR
2.0000 mg | Freq: Once | INTRAMUSCULAR | Status: AC | PRN
  Filled 2013-05-18: qty 2

## 2013-05-18 MED ORDER — ONDANSETRON HCL 4 MG PO TABS: 4.0000 mg | ORAL_TABLET | Freq: Four times a day (QID) | ORAL | Status: DC | PRN

## 2013-05-18 NOTE — Evaluation (Signed)
Occupational Therapy Evaluation Patient Details Name: Jack [name] MRN: [id number] DOB: 1945-10-08 Today's Date: 05/18/2013    History of Present Illness 68 yo male s/p Lt transitibial amputation ( LT BKA). Recently d/c 4/24 to [facility] SNF s/p LT BKA on 04/26/13.    Clinical Impression   Patient is s/p Lt transtibial amputation surgery resulting in functional limitations due to the deficits listed below (see OT problem list). Recent admission on 4/13 for LT BKA. Pt has HD MWF.  Patient will benefit from skilled OT acutely to increase independence and safety with ADLS to allow discharge CIR. Pt demonstrates cognitive deficits, balance deficits, and decr activity tolerance.     Follow Up Recommendations  CIR    Equipment Recommendations  Other (comment) (will need Lt LE BKA leg rest)    Recommendations for Other Services       Precautions / Restrictions Precautions Precautions: Fall Precaution Comments: Lt BKA NWB Restrictions LLE Weight Bearing: Non weight bearing      Mobility Bed Mobility Overal bed mobility: Needs Assistance Bed Mobility: Supine to Sit;Sit to Supine     Supine to sit: Supervision Sit to supine: Supervision   General bed mobility comments: Pt requires the use of bed rail and extended time  Transfers Overall transfer level: Needs assistance   Transfers: Sit to/from Stand Sit to Stand: Mod assist         General transfer comment: cues for hand placement, elevated bed surface    Balance Overall balance assessment: Needs assistance Sitting-balance support: Feet supported;Bilateral upper extremity supported Sitting balance-Leahy Scale: Fair     Standing balance support: During functional activity;Bilateral upper extremity supported Standing balance-Leahy Scale: Poor                              ADL Overall ADL's : Needs assistance/impaired                     Lower Body Dressing: Total assistance;Sit to/from  stand               Functional mobility during ADLs: Moderate assistance (sit <.stand this session x2) General ADL Comments: Pt supine on arrival and educated on reason for OT session. with daughters encouragement engaged in therapy session. pt with delayed processing and slow to initiate any task. pt sitting EOB and terminating task and returning supine due to decr attention to task. Pt returning to eob with encouragement. pt completed sit<>stand x2 this session one person (A). Pt reports fatigue more than normal after HD.     Vision                 Additional Comments: noted eyes not aligned   Perception Perception Perception Tested?: No   Praxis Praxis Praxis tested?: Not tested    Pertinent Vitals/Pain Reports pain in Lt LE with sitting RN in room providing medication- pt at end of session asking for medication and not recall of RN giving medication     Hand Dominance Right   Extremity/Trunk Assessment Upper Extremity Assessment Upper Extremity Assessment: Generalized weakness   Lower Extremity Assessment Lower Extremity Assessment: Defer to PT evaluation   Cervical / Trunk Assessment Cervical / Trunk Assessment: Kyphotic   Communication Communication Communication: No difficulties   Cognition Arousal/Alertness: Awake/alert Behavior During Therapy: Flat affect Overall Cognitive Status: Impaired/Different from baseline Area of Impairment: Safety/judgement;Awareness;Problem solving;Memory   Current Attention Level: Sustained Memory: Decreased short-term memory;Decreased recall  of precautions Following Commands: Follows one step commands with increased time Safety/Judgement: Decreased awareness of deficits Awareness: Emergent Problem Solving: Slow processing;Difficulty sequencing General Comments: Pt with poor recall of NWB LT LE, needs extended time to initiate task. Daughter encouraging patient to initiate and pt responding harshly to encouragement  during session. "thats enough" in a strong tone to daughter   General Comments       Exercises       Shoulder Instructions      Home Living Family/patient expects to be discharged to:: Inpatient rehab Living Arrangements: Children                               Additional Comments: Pt recently transferred from [facility] place SNF. Pt working with OT / PT at SNF      Prior Functioning/Environment Level of Independence: Needs assistance  Gait / Transfers Assistance Needed: sit<>stand transfer to w/c only ADL's / Homemaking Assistance Needed: requires (A) for bathing/ dressing   Comments: requires use of w/c since 4/13 amputation    OT Diagnosis: Generalized weakness;Cognitive deficits;Acute pain   OT Problem List: Decreased strength;Decreased activity tolerance;Impaired balance (sitting and/or standing);Decreased safety awareness;Decreased knowledge of use of DME or AE;Decreased knowledge of precautions;Pain;Decreased cognition   OT Treatment/Interventions: Self-care/ADL training;Therapeutic exercise;DME and/or AE instruction;Cognitive remediation/compensation;Therapeutic activities;Patient/family education;Balance training    OT Goals(Current goals can be found in the care plan section) Acute Rehab OT Goals Patient Stated Goal: lie back down OT Goal Formulation: With patient Time For Goal Achievement: 06/01/13 Potential to Achieve Goals: Good  OT Frequency: Min 2X/week   Barriers to D/C:            Co-evaluation              End of Session Nurse Communication: Mobility status;Precautions  Activity Tolerance: Patient tolerated treatment well Patient left: in bed;with call bell/phone within reach   Time: 1601-0932 OT Time Calculation (min): 19 min Charges:  OT General Charges $OT Visit: 1 Procedure OT Evaluation $Initial OT Evaluation Tier I: 1 Procedure OT Treatments $Self Care/Home Management : 8-22 mins G-Codes:    Peri Maris 2013-06-12,  9:00 AM Pager: [phone]

## 2013-05-18 NOTE — Progress Notes (Signed)
Physical Medicine and Rehabilitation Consult  Reason for Consult: Left BKA and h/o Right transmetatarsal amputation.  Referring Physician: Dr. Sharol Given  HPI: Jack [name] is a 68 y.o. male with h/o of MM, ESRD-HD, DM type 2 with insensate neuropathy, recent MRSA bacteremia and poorly healing L-transmet amputation wound with progressive gangrenous changes. Was discharged to [facility] place for therapy and antibiotics. He continued to have poor wound healing and was admitted on 05/14/13 for L-BKA by Dr. Sharol Given. Post op HD ongoing and transfused of acute on chronic anemia. To continue IV vancomycin through 05/28/13 for bacteremia from foot wound. Therapy evaluation done this weekend and CIR recommended by rehab team.  Review of Systems  HENT: Negative for hearing loss.  Eyes: Negative for blurred vision and double vision.  Respiratory: Negative for cough and shortness of breath.  Cardiovascular: Negative for chest pain and palpitations.  Gastrointestinal: Negative for heartburn and abdominal pain.  Musculoskeletal: Positive for joint pain and myalgias.  Neurological: Positive for dizziness. Negative for headaches.   Past Medical History   Diagnosis  Date   .  Hypertension    .  Shortness of breath    .  Pneumonia      2012   .  Heart murmur    .  Glaucoma    .  Multiple myeloma, without mention of having achieved remission  03/30/2012     Cytogenetic neg on 03/23/2012.   .  Asthma    .  Hyperparathyroidism, secondary renal    .  Peripheral arterial disease    .  End stage renal disease on dialysis      Stage 5   .  Diabetes mellitus without complication      Type 1   .  Thyroid disease      hyperparathyroidism   .  MRSA bacteremia    .  Anemia     Past Surgical History   Procedure  Laterality  Date   .  Thyroidectomy     .  Cervical disc surgery     .  Eye surgery       CATARACTS   .  Insertion of dialysis catheter  Right  03/19/2012     Procedure: INSERTION OF DIALYSIS CATHETER;  Surgeon: Mal Misty, MD; Location: [place]; Service: Vascular; Laterality: Right; Right Internal Jugular   .  Av fistula placement  Left  03/25/2012     Procedure: ARTERIOVENOUS (AV) FISTULA CREATION; Surgeon: Mal Misty, MD; Location: [place]; Service: Vascular; Laterality: Left;   .  Ligation of competing branches of arteriovenous fistula  Left  05/08/2012     Procedure: LIGATION OF COMPETING BRANCHES OF ARTERIOVENOUS FISTULA; Surgeon: Mal Misty, MD; Location: [place]; Service: Vascular; Laterality: Left; Ultrasound guided   .  Cardiac catheterization       approx 30 years ago   .  Amputation  Right  11/10/2012     Procedure: AMPUTATION FIRST and SECOND TOES Right Foot; Surgeon: Elam Dutch, MD; Location: [facility] OR; Service: Vascular; Laterality: Right;   .  Transmetatarsal amputation  Left  12/16/2012     Procedure: TRANSMETATARSAL AMPUTATION AND VAC PLACEMENT; Surgeon: Elam Dutch, MD; Location: [place]; Service: Vascular; Laterality: Left;   .  Amputation  Left  04/07/2013     Procedure: AMPUTATION DIGIT- LEFT 1ST TOE; Surgeon: Mal Misty, MD; Location: [name]; Service: Vascular; Laterality: Left;   .  Tee without cardioversion  N/A  04/20/2013  Procedure: TRANSESOPHAGEAL ECHOCARDIOGRAM (TEE); Surgeon: Josue Hector, MD; Location: [facility]; Service: Cardiovascular; Laterality: N/A;   .  I&d extremity  Left  04/22/2013     Procedure: [id number] WGY DEBRIDEMENT LEFT FIRST TOE AMPUTATION WOUND ; Surgeon: Mal Misty, MD; Location: [place]; Service: Vascular; Laterality: Left;   .  Amputation  Left  04/26/2013     Procedure: Left Foot Transmetatarsal Amputation; Surgeon: Newt Minion, MD; Location: [place]; Service: Orthopedics; Laterality: Left;   .  Toe amputation       D/C 04-30-13    Family History   Problem  Relation  Age of Onset   .  Diabetes  Mother    .  Cancer  Mother      bone   .  Kidney disease  Father     Social History: Lives with family. Independent prior to  04/2013. He reports that he quit smoking about 31 years ago. His smoking use included Cigarettes. He smoked 0.00 packs per day. He has never used smokeless tobacco. He reports that he does not drink alcohol or use illicit drugs.  Allergies   Allergen  Reactions   .  Bee Venom  Anaphylaxis   .  Lisinopril  Cough   .  Ivp Dye [Iodinated Diagnostic Agents]  Hives   .  Morphine And Related  Other (See Comments)     Bradycardia states patient    Medications Prior to Admission   Medication  Sig  Dispense  Refill   .  aspirin EC 81 MG tablet  Take 81 mg by mouth daily.     Marland Kitchen  b complex-vitamin c-folic acid (NEPHRO-VITE) 0.8 MG TABS tablet  Take 1 tablet by mouth daily.     .  calcium acetate (PHOSLO) 667 MG capsule  Take 1 capsule (667 mg total) by mouth 3 (three) times daily with meals.  90 capsule  0   .  colchicine 0.6 MG tablet  Take 0.5 tablets (0.3 mg total) by mouth daily.     Marland Kitchen  doxazosin (CARDURA) 2 MG tablet  Take 2 mg by mouth at bedtime.     Marland Kitchen  doxercalciferol (HECTOROL) 4 MCG/2ML injection  Inject 0.5 mLs (1 mcg total) into the vein every Monday, Wednesday, and Friday with hemodialysis.  2 mL    .  feeding supplement, RESOURCE BREEZE, (RESOURCE BREEZE) LIQD  Take 1 Container by mouth 3 (three) times daily between meals.   0   .  levothyroxine (SYNTHROID, LEVOTHROID) 175 MCG tablet  Take 175 mcg by mouth daily before breakfast.     .  multivitamin (RENA-VIT) TABS tablet  Take 1 tablet by mouth at bedtime.  30 tablet  0   .  nebivolol (BYSTOLIC) 5 MG tablet  Take 5 mg by mouth at bedtime.     .  saxagliptin HCl (ONGLYZA) 5 MG TABS tablet  Take 5 mg by mouth daily.     .  sodium chloride 0.9 % SOLN 100 mL with ferric gluconate 12.5 MG/ML SOLN 125 mg  Inject 125 mg into the vein every Monday, Wednesday, and Friday with hemodialysis.     Marland Kitchen  vancomycin (VANCOCIN) 1 GM/200ML SOLN  Inject 200 mLs (1,000 mg total) into the vein every Monday, Wednesday, and Friday with hemodialysis. Will be stopped  by infectious disease  4000 mL    .  [DISCONTINUED] oxyCODONE (OXY IR/ROXICODONE) 5 MG immediate release tablet  Take 1 tablet (5 mg total) by mouth every  6 (six) hours as needed for severe pain.  10 tablet  0   .  loperamide (IMODIUM) 2 MG capsule  Take 1 capsule (2 mg total) by mouth as needed for diarrhea or loose stools.  30 capsule  0    Home:  Home Living  Family/patient expects to be discharged to:: Private residence  Living Arrangements: Children  Available Help at Discharge: Family;Available 24 hours/day  Type of Home: Apartment  Home Access: Level entry  Home Layout: One level  Home Equipment: Walker - 2 wheels;Cane - single point;Shower seat;Wheelchair - manual  Functional History:  Prior Function  Level of Independence: Needs assistance  Gait / Transfers Assistance Needed: patient reports he needed assistance to get to w/c  ADL's / Homemaking Assistance Needed: Assist with bathing and dressing  Functional Status:  Mobility:  Bed Mobility  Overal bed mobility: Needs Assistance  Bed Mobility: Supine to Sit;Sit to Supine;Rolling  Rolling: Modified independent (Device/Increase time)  Supine to sit: Min assist  Sit to supine: Min assist  General bed mobility comments: used railing; assistance given to raise shoulders off bed     ADL:   Cognition:  Cognition  Overall Cognitive Status: No family/caregiver present to determine baseline cognitive functioning  Orientation Level: Oriented to person;Oriented to situation;Disoriented to person  Cognition  Arousal/Alertness: Lethargic;Suspect due to medications (or HD today)  Behavior During Therapy: Flat affect  Overall Cognitive Status: No family/caregiver present to determine baseline cognitive functioning  Area of Impairment: Following commands;Problem solving  Following Commands: Follows one step commands inconsistently  Problem Solving: Slow processing;Decreased initiation  Blood pressure 132/50, pulse 64, temperature  97.7 F (36.5 C), temperature source Oral, resp. rate 19, weight 67.3 kg (148 lb 5.9 oz), SpO2 100.00%.  Physical Exam  Nursing note and vitals reviewed.  Constitutional: He is oriented to person, place, and time.  Thin male--kept eyes closed thorough the exam with minimal interaction.  HENT:  Head: Normocephalic and atraumatic.  Eyes: Conjunctivae are normal.  Neck: Normal range of motion. Neck supple.  Cardiovascular: Normal rate and regular rhythm.  Respiratory: Effort normal and breath sounds normal.  GI: Soft. Bowel sounds are normal.  Musculoskeletal: He exhibits tenderness (left BKA site. ).  Well healed right transmet site. Left hand with thenar atrophy and mild contracture. Left BKA with compressive dressing and tends to keep flexed.  Neurological: He is oriented to person, place, and time.  Flat affect. A little confused. Follows basic commands without difficulty. Stocking-glove peripheral neuropathy. RLE weakness noted.  Skin: Skin is warm and dry.  Psychiatric: His affect is blunt. He is withdrawn. He is noncommunicative.   Results for orders placed during the hospital encounter of 05/14/13 (from the past 24 hour(s))   GLUCOSE, CAPILLARY Status: Abnormal    Collection Time    05/16/13 12:13 PM   Result  Value  Ref Range    Glucose-Capillary  121 (*)  70 - 99 mg/dL   GLUCOSE, CAPILLARY Status: Abnormal    Collection Time    05/16/13 4:37 PM   Result  Value  Ref Range    Glucose-Capillary  113 (*)  70 - 99 mg/dL   GLUCOSE, CAPILLARY Status: None    Collection Time    05/16/13 10:42 PM   Result  Value  Ref Range    Glucose-Capillary  96  70 - 99 mg/dL    Comment 1  Documented in Chart     Comment 2  Notify RN    GLUCOSE,  CAPILLARY Status: None    Collection Time    05/17/13 6:11 AM   Result  Value  Ref Range    Glucose-Capillary  89  70 - 99 mg/dL    Comment 1  Documented in Chart     Comment 2  Notify RN    CBC WITH DIFFERENTIAL Status: Abnormal    Collection  Time    05/17/13 6:33 AM   Result  Value  Ref Range    WBC  9.4  4.0 - 10.5 K/uL    RBC  3.58 (*)  4.22 - 5.81 MIL/uL    Hemoglobin  9.4 (*)  13.0 - 17.0 g/dL    HCT  31.1 (*)  39.0 - 52.0 %    MCV  86.9  78.0 - 100.0 fL    MCH  26.3  26.0 - 34.0 pg    MCHC  30.2  30.0 - 36.0 g/dL    RDW  15.4  11.5 - 15.5 %    Platelets  305  150 - 400 K/uL    Neutrophils Relative %  73  43 - 77 %    Neutro Abs  6.8  1.7 - 7.7 K/uL    Lymphocytes Relative  16  12 - 46 %    Lymphs Abs  1.5  0.7 - 4.0 K/uL    Monocytes Relative  10  3 - 12 %    Monocytes Absolute  0.9  0.1 - 1.0 K/uL    Eosinophils Relative  1  0 - 5 %    Eosinophils Absolute  0.1  0.0 - 0.7 K/uL    Basophils Relative  0  0 - 1 %    Basophils Absolute  0.0  0.0 - 0.1 K/uL    No results found.  Assessment/Plan:  Diagnosis: left BKA  1. Does the need for close, 24 hr/day medical supervision in concert with the patient's rehab needs make it unreasonable for this patient to be served in a less intensive setting? Yes 2. Co-Morbidities requiring supervision/potential complications: htn, anemia, ESRD 3. Due to bowel management, safety, skin/wound care, disease management, medication administration, pain management and patient education, does the patient require 24 hr/day rehab nursing? Yes 4. Does the patient require coordinated care of a physician, rehab nurse, PT (1-2 hrs/day, 5 days/week) and OT (1-2 hrs/day, 5 days/week) to address physical and functional deficits in the context of the above medical diagnosis(es)? Yes Addressing deficits in the following areas: balance, endurance, locomotion, strength, transferring, bowel/bladder control, bathing, dressing, feeding, grooming, toileting and psychosocial support 5. Can the patient actively participate in an intensive therapy program of at least 3 hrs of therapy per day at least 5 days per week? Yes 6. The potential for patient to make measurable gains while on inpatient rehab is  excellent 7. Anticipated functional outcomes upon discharge from inpatient rehab are modified independent with PT, modified independent with OT, n/a with SLP. 8. Estimated rehab length of stay to reach the above functional goals is: 7-9 days 9. Does the patient have adequate social supports to accommodate these discharge functional goals? Yes 10. Anticipated D/C setting: Home 11. Anticipated post D/C treatments: [place] therapy 12. Overall Rehab/Functional Prognosis: excellent RECOMMENDATIONS:  This patient's condition is appropriate for continued rehabilitative care in the following setting: CIR  Patient has agreed to participate in recommended program. Yes  Note that insurance prior authorization may be required for reimbursement for recommended care.  Comment: Rehab Admissions Coordinator to follow up.  Thanks,  Meredith Staggers, MD, Mellody Drown  05/17/2013  Revision History...      Date/Time User Action    05/17/2013 9:52 AM Meredith Staggers, MD Sign    05/17/2013 8:56 AM Bary Leriche, PA-C Share   View Details Report    Routing History.Marland KitchenMarland Kitchen

## 2013-05-18 NOTE — H&P (View-Only) (Signed)
Physical Medicine and Rehabilitation Admission H&P    CC:  Left BKA and h/o Right transmetatarsal amputation  HPI:  Jack [name] is a 68 y.o. male with h/o of MM, ESRD-HD, DM type 2 with insensate neuropathy, recent MRSA bacteremia and poorly healing L-transmet amputation wound with progressive gangrenous changes. Was discharged to [facility] place for therapy and antibiotics. He continued to have poor wound healing and was admitted on 05/14/13 for L-BKA by Dr. Sharol Given. Post op HD ongoing and transfused of acute on chronic anemia. To continue IV vancomycin through 05/28/13 for bacteremia from foot wound. Attempted to get OOB independently and sustained a fall this am. Therapy working on balance as pregait activities. CIR recommended by rehab team and patient admitted today.    Review of Systems  HENT: Negative for hearing loss.   Eyes: Negative for blurred vision and double vision.  Respiratory: Positive for shortness of breath. Negative for cough.   Cardiovascular: Negative for chest pain and palpitations.  Gastrointestinal: Negative for heartburn, nausea and constipation.  Musculoskeletal: Positive for joint pain. Negative for back pain and myalgias.  Neurological: Positive for weakness. Negative for headaches.  Psychiatric/Behavioral: The patient is not nervous/anxious and does not have insomnia.     Past Medical History  Diagnosis Date  . Hypertension   . Pneumonia     2012  . Heart murmur   . Glaucoma   . Multiple myeloma, without mention of having achieved remission 03/30/2012    Cytogenetic neg on 03/23/2012.  . Asthma   . Hyperparathyroidism, secondary renal   . Peripheral arterial disease   . End-stage renal disease on hemodialysis     Started HD March 2014.  Cause of ESRD was DM.  Gets HD at [facility] on [address] on MWF schedule.   . Diabetes mellitus without complication     Type 1  . Thyroid disease     hyperparathyroidism  . MRSA bacteremia   . Anemia   .  Peripheral vascular disease, unspecified 11/19/2012    In the past had R foot toe amps then R TMA. In 2015 had left foot toe amp > then TMA >then L BKA on 05/14/13   . History of MRSA infection 04/22/2013    Bacteremia assoc w L foot wound infection Mar 2015    Past Surgical History  Procedure Laterality Date  . Thyroidectomy    . Cervical disc surgery    . Eye surgery      CATARACTS  . Insertion of dialysis catheter Right 03/19/2012    Procedure: INSERTION OF DIALYSIS CATHETER;  Surgeon: Mal Misty, MD;  Location: [place];  Service: Vascular;  Laterality: Right;  Right Internal Jugular  . Av fistula placement Left 03/25/2012    Procedure: ARTERIOVENOUS (AV) FISTULA CREATION;  Surgeon: Mal Misty, MD;  Location: [place];  Service: Vascular;  Laterality: Left;  . Ligation of competing branches of arteriovenous fistula Left 05/08/2012    Procedure: LIGATION OF COMPETING BRANCHES OF ARTERIOVENOUS FISTULA;  Surgeon: Mal Misty, MD;  Location: [place];  Service: Vascular;  Laterality: Left;  Ultrasound guided  . Cardiac catheterization      approx 30 years ago  . Amputation Right 11/10/2012    Procedure: AMPUTATION FIRST and SECOND TOES Right Foot;  Surgeon: Elam Dutch, MD;  Location: [place];  Service: Vascular;  Laterality: Right;  . Transmetatarsal amputation Left 12/16/2012    Procedure: TRANSMETATARSAL AMPUTATION AND VAC PLACEMENT;  Surgeon: Jessy Oto  Fields, MD;  Location: [place];  Service: Vascular;  Laterality: Left;  . Amputation Left 04/07/2013    Procedure: AMPUTATION DIGIT- LEFT 1ST TOE;  Surgeon: Mal Misty, MD;  Location: [place];  Service: Vascular;  Laterality: Left;  . Tee without cardioversion N/A 04/20/2013    Procedure: TRANSESOPHAGEAL ECHOCARDIOGRAM (TEE);  Surgeon: Josue Hector, MD;  Location: [facility] ENDOSCOPY;  Service: Cardiovascular;  Laterality: N/A;  . I&d extremity Left 04/22/2013    Procedure: EHMCNOBSJG GEZ DEBRIDEMENT LEFT FIRST TOE AMPUTATION WOUND ;  Surgeon: Mal Misty, MD;  Location: [place];  Service: Vascular;  Laterality: Left;  . Amputation Left 04/26/2013    Procedure: Left Foot Transmetatarsal Amputation;  Surgeon: Newt Minion, MD;  Location: [place];  Service: Orthopedics;  Laterality: Left;  . Toe amputation      D/C 04-30-13  . Below knee leg amputation Left 05/14/2013    DR [name]   Family History  Problem Relation Age of Onset  . Diabetes Mother   . Cancer Mother     bone   . Kidney disease Father    Social History: Lives with family. Independent prior to 04/2013. He reports that he quit smoking about 31 years ago. His smoking use included Cigarettes. He smoked 0.00 packs per day. He has never used smokeless tobacco. He reports that he does not drink alcohol or use illicit drugs   Allergies  Allergen Reactions  . Bee Venom Anaphylaxis  . Lisinopril Cough  . Ivp Dye [Iodinated Diagnostic Agents] Hives  . Morphine And Related Other (See Comments)    Bradycardia states patient    Medications Prior to Admission  Medication Sig Dispense Refill  . aspirin EC 81 MG tablet Take 81 mg by mouth daily.      Marland Kitchen b complex-vitamin c-folic acid (NEPHRO-VITE) 0.8 MG TABS tablet Take 1 tablet by mouth daily.      . calcium acetate (PHOSLO) 667 MG capsule Take 1 capsule (667 mg total) by mouth 3 (three) times daily with meals.  90 capsule  0  . colchicine 0.6 MG tablet Take 0.5 tablets (0.3 mg total) by mouth daily.      Marland Kitchen doxazosin (CARDURA) 2 MG tablet Take 2 mg by mouth at bedtime.      Marland Kitchen doxercalciferol (HECTOROL) 4 MCG/2ML injection Inject 0.5 mLs (1 mcg total) into the vein every Monday, Wednesday, and Friday with hemodialysis.  2 mL    . feeding supplement, RESOURCE BREEZE, (RESOURCE BREEZE) LIQD Take 1 Container by mouth 3 (three) times daily between meals.    0  . levothyroxine (SYNTHROID, LEVOTHROID) 175 MCG tablet Take 175 mcg by mouth daily before breakfast.      . multivitamin (RENA-VIT) TABS tablet Take 1 tablet by mouth at bedtime.  30  tablet  0  . nebivolol (BYSTOLIC) 5 MG tablet Take 5 mg by mouth at bedtime.      . saxagliptin HCl (ONGLYZA) 5 MG TABS tablet Take 5 mg by mouth daily.       . sodium chloride 0.9 % SOLN 100 mL with ferric gluconate 12.5 MG/ML SOLN 125 mg Inject 125 mg into the vein every Monday, Wednesday, and Friday with hemodialysis.      Marland Kitchen vancomycin (VANCOCIN) 1 GM/200ML SOLN Inject 200 mLs (1,000 mg total) into the vein every Monday, Wednesday, and Friday with hemodialysis. Will be stopped by infectious disease  4000 mL    . [DISCONTINUED] oxyCODONE (OXY IR/ROXICODONE) 5 MG immediate release  tablet Take 1 tablet (5 mg total) by mouth every 6 (six) hours as needed for severe pain.  10 tablet  0  . loperamide (IMODIUM) 2 MG capsule Take 1 capsule (2 mg total) by mouth as needed for diarrhea or loose stools.  30 capsule  0    Home: Home Living Family/patient expects to be discharged to:: Inpatient rehab Living Arrangements: Children Available Help at Discharge: Family;Available 24 hours/day Type of Home: Apartment Home Access: Level entry Home Layout: One level Home Equipment: Walker - 2 wheels;Cane - single point;Shower seat;Wheelchair - manual Additional Comments: Pt recently transferred from [facility]. Pt working with OT / PT at SNF   Functional History: Prior Function Level of Independence: Needs assistance Gait / Transfers Assistance Needed: sit<>stand transfer to w/c only ADL's / Homemaking Assistance Needed: requires (A) for bathing/ dressing Comments: requires use of w/c since 4/13 amputation  Functional Status:  Mobility: Bed Mobility Overal bed mobility: Needs Assistance Bed Mobility: Supine to Sit;Sit to Supine Rolling: Modified independent (Device/Increase time) Supine to sit: Min assist Sit to supine: Supervision General bed mobility comments: Min A to control trunk. Transfers Overall transfer level: Needs assistance Transfers: Sit to/from Stand Sit to Stand: +2 physical  assistance;Mod assist Squat pivot transfers: Mod assist;+2 physical assistance General transfer comment: A for anterior shift and to block R LE. A for SPT to shift weight as patient unable to stand fully upright.       ADL: ADL Overall ADL's : Needs assistance/impaired Lower Body Dressing: Total assistance;Sit to/from stand Functional mobility during ADLs: Moderate assistance (sit <.stand this session x2) General ADL Comments: Pt supine on arrival and educated on reason for OT session. with daughters encouragement engaged in therapy session. pt with delayed processing and slow to initiate any task. pt sitting EOB and terminating task and returning supine due to decr attention to task. Pt returning to eob with encouragement. pt completed sit<>stand x2 this session one person (A). Pt reports fatigue more than normal after HD.  Cognition: Cognition Overall Cognitive Status: Impaired/Different from baseline Orientation Level: Oriented to person;Disoriented to time Cognition Arousal/Alertness: Awake/alert Behavior During Therapy: Flat affect Overall Cognitive Status: Impaired/Different from baseline Area of Impairment: Safety/judgement;Awareness;Problem solving;Memory Current Attention Level: Sustained Memory: Decreased short-term memory;Decreased recall of precautions Following Commands: Follows one step commands with increased time Safety/Judgement: Decreased awareness of deficits Awareness: Emergent Problem Solving: Slow processing;Difficulty sequencing General Comments: Pt with poor recall of NWB LT LE, needs extended time to initiate task. Daughter encouraging patient to initiate and pt responding harshly to encouragement during session. "thats enough" in a strong tone to daughter  Physical Exam: Blood pressure 104/57, pulse 75, temperature 98.1 F (36.7 C), temperature source Oral, resp. rate 18, weight 67.7 kg (149 lb 4 oz), SpO2 100.00%. Physical Exam Nursing note and vitals  reviewed.  Constitutional: He is oriented to person, place, and time.  Thin male--kept eyes closed thorough the exam with minimal interaction.  HENT: oral mucosa pink/moist Head: Normocephalic and atraumatic.  Eyes: Conjunctivae are normal.  Neck: Normal range of motion. Neck supple.  Cardiovascular: Normal rate and regular rhythm. No murmurs Respiratory: Effort normal and breath sounds normal. No wheezes rales or rhonchi GI: Soft. Bowel sounds are normal.  Musculoskeletal: He exhibits tenderness (left BKA site. ).  Well healed right transmet site. Left hand with thenar atrophy and mild contracture. Left BKA with compressive dressing and tends to keep flexed.  Neurological: He is oriented to person, place, and time.  Flat affect. remains a little confused. Follows basic commands without difficulty. Improving insight.  Stocking-glove peripheral neuropathy. RLE weakness noted.  Skin: Skin is warm and dry.  Psychiatric: His affect is blunt. He is withdrawn.     Results for orders placed during the hospital encounter of 05/14/13 (from the past 48 hour(s))  GLUCOSE, CAPILLARY     Status: Abnormal   Collection Time    05/16/13  4:37 PM      Result Value Ref Range   Glucose-Capillary 113 (*) 70 - 99 mg/dL  GLUCOSE, CAPILLARY     Status: None   Collection Time    05/16/13 10:42 PM      Result Value Ref Range   Glucose-Capillary 96  70 - 99 mg/dL   Comment 1 Documented in Chart     Comment 2 Notify RN    GLUCOSE, CAPILLARY     Status: None   Collection Time    05/17/13  6:11 AM      Result Value Ref Range   Glucose-Capillary 89  70 - 99 mg/dL   Comment 1 Documented in Chart     Comment 2 Notify RN    CBC WITH DIFFERENTIAL     Status: Abnormal   Collection Time    05/17/13  6:33 AM      Result Value Ref Range   WBC 9.4  4.0 - 10.5 K/uL   RBC 3.58 (*) 4.22 - 5.81 MIL/uL   Hemoglobin 9.4 (*) 13.0 - 17.0 g/dL   HCT 31.1 (*) 39.0 - 52.0 %   MCV 86.9  78.0 - 100.0 fL   MCH 26.3  26.0  - 34.0 pg   MCHC 30.2  30.0 - 36.0 g/dL   RDW 15.4  11.5 - 15.5 %   Platelets 305  150 - 400 K/uL   Neutrophils Relative % 73  43 - 77 %   Neutro Abs 6.8  1.7 - 7.7 K/uL   Lymphocytes Relative 16  12 - 46 %   Lymphs Abs 1.5  0.7 - 4.0 K/uL   Monocytes Relative 10  3 - 12 %   Monocytes Absolute 0.9  0.1 - 1.0 K/uL   Eosinophils Relative 1  0 - 5 %   Eosinophils Absolute 0.1  0.0 - 0.7 K/uL   Basophils Relative 0  0 - 1 %   Basophils Absolute 0.0  0.0 - 0.1 K/uL  GLUCOSE, CAPILLARY     Status: Abnormal   Collection Time    05/17/13 11:17 AM      Result Value Ref Range   Glucose-Capillary 114 (*) 70 - 99 mg/dL  RENAL FUNCTION PANEL     Status: Abnormal   Collection Time    05/17/13 12:02 PM      Result Value Ref Range   Sodium 133 (*) 137 - 147 mEq/L   Potassium 4.2  3.7 - 5.3 mEq/L   Chloride 93 (*) 96 - 112 mEq/L   CO2 24  19 - 32 mEq/L   Glucose, Bld 112 (*) 70 - 99 mg/dL   BUN 26 (*) 6 - 23 mg/dL   Creatinine, Ser 6.71 (*) 0.50 - 1.35 mg/dL   Calcium 9.2  8.4 - 10.5 mg/dL   Phosphorus 3.9  2.3 - 4.6 mg/dL   Albumin 2.1 (*) 3.5 - 5.2 g/dL   GFR calc non Af Amer 7 (*) >90 mL/min   GFR calc Af Amer 9 (*) >90 mL/min   Comment: (NOTE)  The eGFR has been calculated using the CKD EPI equation.     This calculation has not been validated in all clinical situations.     eGFR's persistently <90 mL/min signify possible Chronic Kidney     Disease.  GLUCOSE, CAPILLARY     Status: Abnormal   Collection Time    05/17/13  4:37 PM      Result Value Ref Range   Glucose-Capillary 121 (*) 70 - 99 mg/dL  GLUCOSE, CAPILLARY     Status: Abnormal   Collection Time    05/17/13  9:28 PM      Result Value Ref Range   Glucose-Capillary 117 (*) 70 - 99 mg/dL  GLUCOSE, CAPILLARY     Status: None   Collection Time    05/18/13  6:37 AM      Result Value Ref Range   Glucose-Capillary 90  70 - 99 mg/dL  GLUCOSE, CAPILLARY     Status: Abnormal   Collection Time    05/18/13 12:05 PM       Result Value Ref Range   Glucose-Capillary 102 (*) 70 - 99 mg/dL   No results found.     Medical Problem List and Plan: 1. Functional deficits secondary to  L-BKA 2.  DVT Prophylaxis/Anticoagulation: Pharmaceutical: Lovenox 3. Pain Management:  Reports pain with mobility and with dependency of LLE. Will premedicate prior to therapy sessions to help with participation and tolerance of therapy. Will add neurontin 1105m/hs to help with neuropathic pain---watch closely for mentation. 4. Mood:  Flat affect with poor interaction--will have LCSW follow with patient and family for evaluation and support.  5. Neuropsych: This patient is not yet capable of making decisions on his own behalf. 6. ESRD:  Continue HD on Mon, Wed, Fri.  7. Hypotension: BP medications discontinued. Continue to monitor BP every 8 hours.  8. Anemia of chronic disease: Continue aranesp. Routine check of H/H with HD to monitor for stability/trends.  9. MRSA bacteremia: IV vancomycin initiated 04/17/13--8 weeks IV antibiotics recommended by ID.     Post Admission Physician Evaluation: 1. Functional deficits secondary  to left BKA. 2. Patient is admitted to receive collaborative, interdisciplinary care between the physiatrist, rehab nursing staff, and therapy team. 3. Patient's level of medical complexity and substantial therapy needs in context of that medical necessity cannot be provided at a lesser intensity of care such as a SNF. 4. Patient has experienced substantial functional loss from his/her baseline which was documented above under the "Functional History" and "Functional Status" headings.  Judging by the patient's diagnosis, physical exam, and functional history, the patient has potential for functional progress which will result in measurable gains while on inpatient rehab.  These gains will be of substantial and practical use upon discharge  in facilitating mobility and self-care at the household  level. 5. Physiatrist will provide 24 hour management of medical needs as well as oversight of the therapy plan/treatment and provide guidance as appropriate regarding the interaction of the two. 6. 24 hour rehab nursing will assist with bladder management, bowel management, safety, skin/wound care, disease management, medication administration, pain management and patient education  and help integrate therapy concepts, techniques,education, etc. 7. PT will assess and treat for/with: Lower extremity strength, range of motion, stamina, balance, functional mobility, safety, adaptive techniques and equipment, pain mgt, pre-prosthetic education, adjustment of schedule to allow for PM HD, egosupport and positive reinforcement.   Goals are: supervision. 8. OT will assess and treat for/with: ADL's, functional mobility, safety, upper  extremity strength, adaptive techniques and equipment, pain mgt, pacing and schedule adjustment to account for PM HD, ego support and Positive reinforcement, .   Goals are: supervision to perhaps min assist. 9. SLP will assess and treat for/with: n/a at this time.  Goals are: n/a--consider assessment depending upon presentation as we move forward. 10. Case Management and Social Worker will assess and treat for psychological issues and discharge planning. 11. Team conference will be held weekly to assess progress toward goals and to determine barriers to discharge. 12. Patient will receive at least 3 hours of therapy per day at least 5 days per week. 13. ELOS: 14 days       14. Prognosis:  good     Meredith Staggers, MD, [facility]   05/18/2013

## 2013-05-18 NOTE — Progress Notes (Signed)
PMR Admission Coordinator Pre-Admission Assessment  Patient: Jack [name] is an 68 y.o., male  MRN: [id number]  DOB: Mar 03, 1945  Height:  Weight: 67.7 kg (149 lb 4 oz)  Insurance Information  HMO: Yes PPO: PCP: IPA: 80/20: OTHER: Group # [id number]  PRIMARY: AARP Medicare complete Policy#: [id number] Subscriber: [name]  CM Name: Gaylan Gerold Phone#: [phone] Fax#:  Pre-Cert#: [id number] Employer: Retired  Benefits: Phone #: [phone] Name: Renda Rolls. Date: 01/14/13 Deduct: $0 Out of Pocket Max: $4900(met $0102.72) Life Max: unlimited  CIR: $345 days 1-5 SNF: $0 days 1-20; $155 days 21-52; $0 days 53-100  Outpatient: No limits Co-Pay: $40/visit  Home Health: 100% Co-Pay: none  DME: 80% Co-Pay: 20%  Providers: in network  Emergency Contact Information  Contact Information    Name  Relation  Home  Work  [place]  Daughter    [phone]    Carrizales,Cori  Daughter  [phone]   [phone]    Hair,Cantrece  Daughter  [phone]   [phone]      Current Medical History  Patient Admitting Diagnosis: L BKA, old R transmetatarsal amputation  History of Present Illness: A 68 y.o. male with h/o of MM, ESRD-HD, DM type 2 with insensate neuropathy, recent MRSA bacteremia and poorly healing L-transmet amputation wound with progressive gangrenous changes. Was discharged to [facility] place for therapy and antibiotics. He continued to have poor wound healing and was admitted on 05/14/13 for L-BKA by Dr. Sharol Given. Post op HD ongoing and transfused of acute on chronic anemia. To continue IV vancomycin through 05/28/13 for bacteremia from foot wound. Attempted to get OOB independently and sustained a fall this am. Therapy working on balance as pregait activities. CIR recommended by rehab team and patient to be admitted today.    Past Medical History  Past Medical History   Diagnosis  Date   .  Hypertension    .  Pneumonia      2012   .  Heart murmur    .   Glaucoma    .  Multiple myeloma, without mention of having achieved remission  03/30/2012     Cytogenetic neg on 03/23/2012.   .  Asthma    .  Hyperparathyroidism, secondary renal    .  Peripheral arterial disease    .  End-stage renal disease on hemodialysis      Started HD March 2014. Cause of ESRD was DM. Gets HD at [facility] on [address] on MWF schedule.   .  Diabetes mellitus without complication      Type 1   .  Thyroid disease      hyperparathyroidism   .  MRSA bacteremia    .  Anemia    .  Peripheral vascular disease, unspecified  11/19/2012     In the past had R foot toe amps then R TMA. In 2015 had left foot toe amp > then TMA >then L BKA on 05/14/13   .  History of MRSA infection  04/22/2013     Bacteremia assoc w L foot wound infection Mar 2015    Family History  family history includes Cancer in his mother; Diabetes in his mother; Kidney disease in his father.  Prior Rehab/Hospitalizations: Had HH therapies 1 month ago for weakness. Was at [facility] 1 week last week after toes amputated.  Current Medications  Current facility-administered medications:0.9 % sodium chloride infusion, , Intravenous, Continuous, Newt Minion, MD, Last Rate: 10 mL/hr at 05/14/13  1807, 10 mL/hr at 05/14/13 1807; 0.9 % sodium chloride infusion, 100 mL, Intravenous, PRN, Marlena Clipper, NP; 0.9 % sodium chloride infusion, 100 mL, Intravenous, PRN, Marlena Clipper, NP  calcium acetate (PHOSLO) capsule 667 mg, 667 mg, Oral, TID WC, Newt Minion, MD, 667 mg at 05/18/13 0177; colchicine tablet 0.3 mg, 0.3 mg, Oral, Daily, Newt Minion, MD, 0.3 mg at 05/18/13 0950; [START ON 05/21/2013] darbepoetin (ARANESP) injection 60 mcg, 60 mcg, Intravenous, Q Fri-HD, Sol Blazing, MD; feeding supplement (NEPRO CARB STEADY) liquid 237 mL, 237 mL, Oral, PRN, Marlena Clipper, NP  heparin injection 1,000 Units, 1,000 Units, Dialysis, PRN, Marlena Clipper, NP; heparin injection 1,300 Units, 20  Units/kg, Dialysis, Once in dialysis, Marlena Clipper, NP; HYDROmorphone (DILAUDID) injection 0.5-1 mg, 0.5-1 mg, Intravenous, Q2H PRN, Newt Minion, MD, 0.5 mg at 05/14/13 1600; levothyroxine (SYNTHROID, LEVOTHROID) tablet 175 mcg, 175 mcg, Oral, QAC breakfast, Newt Minion, MD, 175 mcg at 05/18/13 0821  lidocaine (PF) (XYLOCAINE) 1 % injection 5 mL, 5 mL, Intradermal, PRN, Marlena Clipper, NP; lidocaine-prilocaine (EMLA) cream 1 application, 1 application, Topical, PRN, Marlena Clipper, NP; loperamide (IMODIUM) capsule 2 mg, 2 mg, Oral, PRN, Newt Minion, MD; methocarbamol (ROBAXIN) 500 mg in dextrose 5 % 50 mL IVPB, 500 mg, Intravenous, Q6H PRN, Newt Minion, MD  methocarbamol (ROBAXIN) tablet 500 mg, 500 mg, Oral, Q6H PRN, Newt Minion, MD, 500 mg at 05/14/13 2153; metoCLOPramide (REGLAN) injection 5-10 mg, 5-10 mg, Intravenous, Q8H PRN, Newt Minion, MD; metoCLOPramide (REGLAN) tablet 5-10 mg, 5-10 mg, Oral, Q8H PRN, Newt Minion, MD; multivitamin (RENA-VIT) tablet 1 tablet, 1 tablet, Oral, QHS, Newt Minion, MD, 1 tablet at 05/17/13 2116  ondansetron [facility]) injection 4 mg, 4 mg, Intravenous, Q6H PRN, Newt Minion, MD; ondansetron [facility]) tablet 4 mg, 4 mg, Oral, Q6H PRN, Newt Minion, MD; oxyCODONE-acetaminophen (PERCOCET/ROXICET) 5-325 MG per tablet 1-2 tablet, 1-2 tablet, Oral, Q4H PRN, Newt Minion, MD, 2 tablet at 05/18/13 1318; pentafluoroprop-tetrafluoroeth (GEBAUERS) aerosol 1 application, 1 application, Topical, PRN, Marlena Clipper, NP  vancomycin (VANCOCIN) IVPB 1000 mg/200 mL premix, 1,000 mg, Intravenous, Q M,W,F-HD, Newt Minion, MD, 1,000 mg at 05/17/13 1527  Patients Current Diet: Carb Control  Precautions / Restrictions  Precautions  Precautions: Fall  Precaution Comments: Lt BKA NWB  Restrictions  Weight Bearing Restrictions: Yes  LLE Weight Bearing: Non weight bearing  Prior Activity Level  Limited Community (1-2x/wk): Went out 3-4 X a week to HD  and to [employer].  Home Assistive Devices / Equipment  Home Assistive Devices/Equipment: Psychologist, clinical (specify type);Eyeglasses;Bedside commode/3-in-1;Shower chair with back  Home Equipment: Walker - 2 wheels;Cane - single point;Shower seat;Wheelchair - manual  Prior Functional Level  Prior Function  Level of Independence: Needs assistance  Gait / Transfers Assistance Needed: sit<>stand transfer to w/c only  ADL's / Homemaking Assistance Needed: requires (A) for bathing/ dressing  Comments: requires use of w/c since 4/13 amputation  Current Functional Level  Cognition  Overall Cognitive Status: Impaired/Different from baseline  Current Attention Level: Sustained  Orientation Level: Oriented to person;Disoriented to time  Following Commands: Follows one step commands with increased time  Safety/Judgement: Decreased awareness of deficits  General Comments: Pt with poor recall of NWB LT LE, needs extended time to initiate task. Daughter encouraging patient to initiate and pt responding harshly to encouragement during session. "thats enough" in a strong tone to daughter   Extremity Assessment  (includes  Sensation/Coordination)  Upper Extremity Assessment: Defer to OT evaluation  Lower Extremity Assessment: Generalized weakness  RLE Deficits / Details: well healed transmet amputation  Cervical / Trunk Assessment: Kyphotic   ADLs  Overall ADL's : Needs assistance/impaired  Lower Body Dressing: Total assistance;Sit to/from stand  Functional mobility during ADLs: Moderate assistance (sit <.stand this session x2)  General ADL Comments: Pt supine on arrival and educated on reason for OT session. with daughters encouragement engaged in therapy session. pt with delayed processing and slow to initiate any task. pt sitting EOB and terminating task and returning supine due to decr attention to task. Pt returning to eob with encouragement. pt completed sit<>stand x2 this session one person (A). Pt  reports fatigue more than normal after HD.   Mobility  Overal bed mobility: Needs Assistance  Bed Mobility: Supine to Sit;Sit to Supine  Rolling: Modified independent (Device/Increase time)  Supine to sit: Min assist  Sit to supine: Supervision  General bed mobility comments: Min A to control trunk.   Transfers  Overall transfer level: Needs assistance  Transfers: Sit to/from Stand  Sit to Stand: +2 physical assistance;Mod assist  Squat pivot transfers: Mod assist;+2 physical assistance  General transfer comment: A for anterior shift and to block R LE. A for SPT to shift weight as patient unable to stand fully upright.   Ambulation / Gait / Stairs / Education administrator / Balance  Dynamic Sitting Balance  Sitting balance - Comments: unable to sit without support.   Special needs/care consideration  BiPAP/CPAP No  CPM No  Continuous Drip IV No  Dialysis Yes Days M-W-F  Life Vest No  Oxygen No  Special Bed No  Trach Size No  Wound Vac (area) No  Skin Has a bedsore on his coccyx. Toes amputated on right foot are healed. Has new L BKA incision with dressing  Bowel mgmt: Last BM 05/11/13  Bladder mgmt: Anuria  Diabetic mgmt Yes, on oral medications at home   Previous Home Environment  Living Arrangements: Children  Available Help at Discharge: Family;Available 24 hours/day  Type of Home: Apartment  Home Layout: One level  Home Access: Level entry  Home Care Services: Yes  Type of Home Care Services: Home RT;[place] (if known): [facility]  Additional Comments: Pt recently transferred from [facility] place SNF. Pt working with OT / PT at SNF  Discharge Living Setting  Plans for Discharge Living Setting: Lives with (comment);Apartment (Lives with 2 daughters and brother.)  Type of Home at Discharge: Apartment  Discharge Home Layout: One level  Discharge Home Access: Level entry (1/2 step to side walk from car area.)  Does the patient have any problems  obtaining your medications?: No  Social/Family/Support Systems  Patient Roles: Parent  Contact Information: Maude Gloor - daughter [phone]  Anticipated Caregiver: Dtrs and brother  Anticipated Caregiver's Contact Information: Deshon Hsiao - Dtr (h) [phone] ([phone]  Ability/Limitations of Caregiver: [name] works. [name] is going to school. Brother is staying with patient to assist.  Caregiver Availability: 24/7  Discharge Plan Discussed with Primary Caregiver: Yes  Is Caregiver In Agreement with Plan?: Yes  Does Caregiver/Family have Issues with Lodging/Transportation while Pt is in Rehab?: No  Goals/Additional Needs  Patient/Family Goal for Rehab: PT/OT mod I goals  Expected length of stay: 7-9 days  Cultural Considerations: None  Dietary Needs: Carb mod med cal, thin liquids  Equipment Needs: TBD  Pt/Family Agrees to Admission and willing  to participate: Yes  Program Orientation Provided & Reviewed with Pt/Caregiver Including Roles & Responsibilities: Yes  Decrease burden of Care through IP rehab admission: N/A  Possible need for SNF placement upon discharge: Not planned  Patient Condition: This patient's condition remains as documented in the consult dated 05/17/13, in which the Rehabilitation Physician determined and documented that the patient's condition is appropriate for intensive rehabilitative care in an inpatient rehabilitation facility. Will admit to inpatient rehab today.  Preadmission Screen Completed By: Retta Diones, 05/18/2013 2:15 PM  ______________________________________________________________________  Discussed status with Dr. Naaman Plummer on 05/18/13 at 1451 and received telephone approval for admission today.  Admission Coordinator: Retta Diones, time1451/Date05/05/15  Cosigned by: Meredith Staggers, MD [05/18/2013 3:14 PM]

## 2013-05-18 NOTE — PMR Pre-admission (Signed)
PMR Admission Coordinator Pre-Admission Assessment  Patient: Jack [name] is an 68 y.o., male MRN: [id number] DOB: Jul 21, 1945 Height:   Weight: 67.7 kg (149 lb 4 oz)              Insurance Information HMO: Yes    PPO:       PCP:       IPA:       80/20:       OTHER:  Group # [id number] PRIMARY: AARP Medicare complete      Policy#: [id number]      Subscriber: [name] CM Name: Gaylan Gerold      Phone#: [phone]     Fax#:   Pre-Cert#: [id number]      Employer: Retired Benefits:  Phone #: [phone]     Name: Renda Rolls. Date: 01/14/13     Deduct: $0      Out of Pocket Max: $4900(met $4967.59)      Life Max: unlimited CIR: $345 days 1-5      SNF: $0 days 1-20; $155 days 21-52; $0 days 53-100 Outpatient: No limits     Co-Pay: $40/visit Home Health: 100%      Co-Pay: none DME: 80%     Co-Pay: 20% Providers: in network   Emergency Contact Information Contact Information   Name Relation Home Work [place] Daughter   [phone]   Nicholl,Cori Daughter [phone]  [phone]   Inabinet,Cantrece Daughter [phone]  [phone]     Current Medical History  Patient Admitting Diagnosis:  L BKA, old R transmetatarsal amputation  History of Present Illness: A 68 y.o. male with h/o of MM, ESRD-HD, DM type 2 with insensate neuropathy, recent MRSA bacteremia and poorly healing L-transmet amputation wound with progressive gangrenous changes. Was discharged to [facility] place for therapy and antibiotics. He continued to have poor wound healing and was admitted on 05/14/13 for L-BKA by Dr. Sharol Given. Post op HD ongoing and transfused of acute on chronic anemia. To continue IV vancomycin through 05/28/13 for bacteremia from foot wound. Attempted to get OOB independently and sustained a fall this am. Therapy working on balance as pregait activities. CIR recommended by rehab team and patient to be admitted today.    Past Medical History  Past Medical History  Diagnosis  Date  . Hypertension   . Pneumonia     2012  . Heart murmur   . Glaucoma   . Multiple myeloma, without mention of having achieved remission 03/30/2012    Cytogenetic neg on 03/23/2012.  . Asthma   . Hyperparathyroidism, secondary renal   . Peripheral arterial disease   . End-stage renal disease on hemodialysis     Started HD March 2014.  Cause of ESRD was DM.  Gets HD at [facility] on [address] on MWF schedule.   . Diabetes mellitus without complication     Type 1  . Thyroid disease     hyperparathyroidism  . MRSA bacteremia   . Anemia   . Peripheral vascular disease, unspecified 11/19/2012    In the past had R foot toe amps then R TMA. In 2015 had left foot toe amp > then TMA >then L BKA on 05/14/13   . History of MRSA infection 04/22/2013    Bacteremia assoc w L foot wound infection Mar 2015     Family History  family history includes Cancer in his mother; Diabetes in his mother; Kidney disease in his father.  Prior Rehab/Hospitalizations:  Had [facility] therapies 1 month ago  for weakness.  Was at [facility] 1 week last week after toes amputated.   Current Medications  Current facility-administered medications:0.9 %  sodium chloride infusion, , Intravenous, Continuous, Newt Minion, MD, Last Rate: 10 mL/hr at 05/14/13 1807, 10 mL/hr at 05/14/13 1807;  0.9 %  sodium chloride infusion, 100 mL, Intravenous, PRN, Marlena Clipper, NP;  0.9 %  sodium chloride infusion, 100 mL, Intravenous, PRN, Marlena Clipper, NP calcium acetate (PHOSLO) capsule 667 mg, 667 mg, Oral, TID WC, Newt Minion, MD, 667 mg at 05/18/13 8115;  colchicine tablet 0.3 mg, 0.3 mg, Oral, Daily, Newt Minion, MD, 0.3 mg at 05/18/13 0950;  [START ON 05/21/2013] darbepoetin (ARANESP) injection 60 mcg, 60 mcg, Intravenous, Q Fri-HD, Sol Blazing, MD;  feeding supplement (NEPRO CARB STEADY) liquid 237 mL, 237 mL, Oral, PRN, Marlena Clipper, NP heparin injection 1,000 Units, 1,000 Units, Dialysis, PRN, Marlena Clipper, NP;  heparin injection 1,300 Units, 20 Units/kg, Dialysis, Once in dialysis, Marlena Clipper, NP;  HYDROmorphone (DILAUDID) injection 0.5-1 mg, 0.5-1 mg, Intravenous, Q2H PRN, Newt Minion, MD, 0.5 mg at 05/14/13 1600;  levothyroxine (SYNTHROID, LEVOTHROID) tablet 175 mcg, 175 mcg, Oral, QAC breakfast, Newt Minion, MD, 175 mcg at 05/18/13 0821 lidocaine (PF) (XYLOCAINE) 1 % injection 5 mL, 5 mL, Intradermal, PRN, Marlena Clipper, NP;  lidocaine-prilocaine (EMLA) cream 1 application, 1 application, Topical, PRN, Marlena Clipper, NP;  loperamide (IMODIUM) capsule 2 mg, 2 mg, Oral, PRN, Newt Minion, MD;  methocarbamol (ROBAXIN) 500 mg in dextrose 5 % 50 mL IVPB, 500 mg, Intravenous, Q6H PRN, Newt Minion, MD methocarbamol (ROBAXIN) tablet 500 mg, 500 mg, Oral, Q6H PRN, Newt Minion, MD, 500 mg at 05/14/13 2153;  metoCLOPramide (REGLAN) injection 5-10 mg, 5-10 mg, Intravenous, Q8H PRN, Newt Minion, MD;  metoCLOPramide (REGLAN) tablet 5-10 mg, 5-10 mg, Oral, Q8H PRN, Newt Minion, MD;  multivitamin (RENA-VIT) tablet 1 tablet, 1 tablet, Oral, QHS, Newt Minion, MD, 1 tablet at 05/17/13 2116 ondansetron [facility]) injection 4 mg, 4 mg, Intravenous, Q6H PRN, Newt Minion, MD;  ondansetron [facility]) tablet 4 mg, 4 mg, Oral, Q6H PRN, Newt Minion, MD;  oxyCODONE-acetaminophen (PERCOCET/ROXICET) 5-325 MG per tablet 1-2 tablet, 1-2 tablet, Oral, Q4H PRN, Newt Minion, MD, 2 tablet at 05/18/13 1318;  pentafluoroprop-tetrafluoroeth (GEBAUERS) aerosol 1 application, 1 application, Topical, PRN, Marlena Clipper, NP vancomycin (VANCOCIN) IVPB 1000 mg/200 mL premix, 1,000 mg, Intravenous, Q M,W,F-HD, Newt Minion, MD, 1,000 mg at 05/17/13 1527  Patients Current Diet: Carb Control  Precautions / Restrictions Precautions Precautions: Fall Precaution Comments: Lt BKA NWB Restrictions Weight Bearing Restrictions: Yes LLE Weight Bearing: Non weight bearing   Prior Activity  Level Limited Community (1-2x/wk): Went out 3-4 X a week to HD and to [employer].  Home Assistive Devices / Equipment Home Assistive Devices/Equipment: Psychologist, clinical (specify type);Eyeglasses;Bedside commode/3-in-1;Shower chair with back Home Equipment: Walker - 2 wheels;Cane - single point;Shower seat;Wheelchair - manual  Prior Functional Level Prior Function Level of Independence: Needs assistance Gait / Transfers Assistance Needed: sit<>stand transfer to w/c only ADL's / Homemaking Assistance Needed: requires (A) for bathing/ dressing Comments: requires use of w/c since 4/13 amputation  Current Functional Level Cognition  Overall Cognitive Status: Impaired/Different from baseline Current Attention Level: Sustained Orientation Level: Oriented to person;Disoriented to time Following Commands: Follows one step commands with increased time Safety/Judgement: Decreased awareness of deficits General Comments: Pt with poor recall of NWB  LT LE, needs extended time to initiate task. Daughter encouraging patient to initiate and pt responding harshly to encouragement during session. "thats enough" in a strong tone to daughter    Extremity Assessment (includes Sensation/Coordination)  Upper Extremity Assessment: Defer to OT evaluation  Lower Extremity Assessment: Generalized weakness  RLE Deficits / Details: well healed transmet amputation  Cervical / Trunk Assessment: Kyphotic      ADLs  Overall ADL's : Needs assistance/impaired Lower Body Dressing: Total assistance;Sit to/from stand Functional mobility during ADLs: Moderate assistance (sit <.stand this session x2) General ADL Comments: Pt supine on arrival and educated on reason for OT session. with daughters encouragement engaged in therapy session. pt with delayed processing and slow to initiate any task. pt sitting EOB and terminating task and returning supine due to decr attention to task. Pt returning to eob with encouragement. pt  completed sit<>stand x2 this session one person (A). Pt reports fatigue more than normal after HD.    Mobility  Overal bed mobility: Needs Assistance Bed Mobility: Supine to Sit;Sit to Supine Rolling: Modified independent (Device/Increase time) Supine to sit: Min assist Sit to supine: Supervision General bed mobility comments: Min A to control trunk.    Transfers  Overall transfer level: Needs assistance Transfers: Sit to/from Stand Sit to Stand: +2 physical assistance;Mod assist Squat pivot transfers: Mod assist;+2 physical assistance General transfer comment: A for anterior shift and to block R LE. A for SPT to shift weight as patient unable to stand fully upright.     Ambulation / Gait / Stairs / Office manager / Balance Dynamic Sitting Balance Sitting balance - Comments: unable to sit without support.    Special needs/care consideration BiPAP/CPAP No CPM No Continuous Drip IV No Dialysis Yes        Days M-W-F Life Vest No Oxygen No Special Bed No Trach Size No Wound Vac (area) No     Skin Has a bedsore on his coccyx.  Toes amputated on right foot are healed.  Has new L BKA incision with dressing                             Bowel mgmt: Last BM 05/11/13 Bladder mgmt: Anuria Diabetic mgmt Yes, on oral medications at home    Previous Home Environment Living Arrangements: Children Available Help at Discharge: Family;Available 24 hours/day Type of Home: Apartment Home Layout: One level Home Access: Level entry Home Care Services: Yes Type of Home Care Services: Home RT;[place] (if known): [facility] Additional Comments: Pt recently transferred from [facility] SNF. Pt working with OT / PT at SNF  Discharge Living Setting Plans for Discharge Living Setting: Lives with (comment);Apartment (Lives with 2 daughters and brother.) Type of Home at Discharge: Apartment Discharge Home Layout: One level Discharge Home Access: Level entry  (1/2 step to side walk from car area.) Does the patient have any problems obtaining your medications?: No  Social/Family/Support Systems Patient Roles: Parent Contact Information: Matthewjames Petrasek - daughter [phone] Anticipated Caregiver: Dtrs and brother Anticipated Caregiver's Contact Information: Perlie Scheuring - Dtr (h) [phone] Ability/Limitations of Caregiver: [name] works.  [name] is going to school.  Brother is staying with patient to assist. Caregiver Availability: 24/7 Discharge Plan Discussed with Primary Caregiver: Yes Is Caregiver In Agreement with Plan?: Yes Does Caregiver/Family have Issues with Lodging/Transportation while Pt is in Rehab?: No  Goals/Additional Needs  Patient/Family Goal for Rehab: PT/OT mod I goals Expected length of stay: 7-9 days Cultural Considerations: None Dietary Needs: Carb mod med cal, thin liquids Equipment Needs: TBD Pt/Family Agrees to Admission and willing to participate: Yes Program Orientation Provided & Reviewed with Pt/Caregiver Including Roles  & Responsibilities: Yes  Decrease burden of Care through IP rehab admission: N/A  Possible need for SNF placement upon discharge: Not planned  Patient Condition: This patient's condition remains as documented in the consult dated 05/17/13, in which the Rehabilitation Physician determined and documented that the patient's condition is appropriate for intensive rehabilitative care in an inpatient rehabilitation facility. Will admit to inpatient rehab today.  Preadmission Screen Completed By:  Retta Diones, 05/18/2013 2:15 PM ______________________________________________________________________   Discussed status with Dr. Naaman Plummer on 05/18/13 at 1451 and received telephone approval for admission today.  Admission Coordinator:  Retta Diones, time1451/Date05/05/15

## 2013-05-18 NOTE — Progress Notes (Signed)
Physical Therapy Treatment Patient Details Name: ARGENIS KUMARI MRN: [id number] DOB: 28-Sep-1945 Today's Date: 05/18/2013    History of Present Illness 68 yo male s/p Lt transitibial amputation ( LT BKA). Recently d/c 4/24 to [facility] SNF s/p LT BKA on 04/26/13.     PT Comments    Patient agreeable to OOB to recliner with max encouragement. Patient limited by cognition and pain. Continue to recommend comprehensive inpatient rehab (CIR) for post-acute therapy needs.   Follow Up Recommendations  CIR     Equipment Recommendations  None recommended by PT    Recommendations for Other Services       Precautions / Restrictions Precautions Precautions: Fall Precaution Comments: Lt BKA NWB Restrictions Weight Bearing Restrictions: Yes LLE Weight Bearing: Non weight bearing    Mobility  Bed Mobility Overal bed mobility: Needs Assistance Bed Mobility: Supine to Sit;Sit to Supine     Supine to sit: Min assist     General bed mobility comments: Min A to control trunk.  Transfers Overall transfer level: Needs assistance     Sit to Stand: +2 physical assistance;Mod assist   Squat pivot transfers: Mod assist;+2 physical assistance     General transfer comment: A for anterior shift and to block R LE. A for SPT to shift weight as patient unable to stand fully upright.   Ambulation/Gait                 Stairs            Wheelchair Mobility    Modified Rankin (Stroke Patients Only)       Balance                                    Cognition Arousal/Alertness: Awake/alert Behavior During Therapy: Flat affect Overall Cognitive Status: Impaired/Different from baseline Area of Impairment: Safety/judgement;Awareness;Problem solving;Memory   Current Attention Level: Sustained Memory: Decreased short-term memory;Decreased recall of precautions Following Commands: Follows one step commands with increased time Safety/Judgement: Decreased  awareness of deficits          Exercises Amputee Exercises Hip ABduction/ADduction: AAROM;Left;10 reps Knee Flexion: Left;5 reps;Seated;AAROM Knee Extension: Left;5 reps;Seated;AAROM Straight Leg Raises: AAROM;Left;10 reps    General Comments        Pertinent Vitals/Pain Complained of pain but unable to rate. patient repositioned for comfort     Home Living                      Prior Function            PT Goals (current goals can now be found in the care plan section) Progress towards PT goals: Progressing toward goals    Frequency  Min 3X/week    PT Plan Current plan remains appropriate    Co-evaluation             End of Session Equipment Utilized During Treatment: Gait belt Activity Tolerance: Patient limited by fatigue;Patient limited by pain Patient left: in chair;with call bell/phone within reach     Time: 1045-1110 PT Time Calculation (min): 25 min  Charges:  $Therapeutic Activity: 23-37 mins                    G Codes:      Tonia Brooms [place] 05/18/2013, 12:50 PM  05/18/2013 [place] PTA [phone] pager [phone] office

## 2013-05-18 NOTE — Progress Notes (Signed)
Rehab admissions - I have insurance approval for acute inpatient rehab admission.  Bed available and will admit today.  Call me for questions.  #[phone]

## 2013-05-18 NOTE — Progress Notes (Signed)
Patient ID: Jack [name], male   DOB: 1945-03-23, 68 y.o.   MRN: [id number] Patient's family anticipates discharge to inpatient rehabilitation and discharged to home afterwards. Insurance approval for inpatient rehabilitation underway.

## 2013-05-18 NOTE — Progress Notes (Signed)
  [facility] Progress Note   Subjective: No complaints. Fell onto floor trying to get out of chair.   Filed Vitals:   05/17/13 1859 05/17/13 2012 05/18/13 0623 05/18/13 1348  BP: 96/32 111/57 125/76 104/57  Pulse: 71 78 85 75  Temp:  97.8 F (36.6 C) 98.3 F (36.8 C) 98.1 F (36.7 C)  TempSrc:  Oral Oral Oral  Resp:  16 18 18   Weight:      SpO2:  98% 98% 100%   Exam: Alert, pale Resp dec'd BS bilat RRR 2/6 SEM Abd soft, NTND LUA AVF BVT, L BKA w dressing, R foot TMA (old) Neuro is alert, Ox3  HD: MWF [place] 4h  85.5kg  Heparin 8700   L BVT Hect 1 tiw        Assessment: 1 PVD s/p L BKA 2 ESRD on HD 3 Anemia on aranesp 4 HTPH cont meds 5 DM 6 HTN/volume- BP's low last several days, well below dry wt and no extra vol on exam; will stop nevibulol and cardura  Plan- HD tomorrow. Stop BP meds. Will ask ID about vanc.     [name] MD  pager [phone]    cell [phone]  05/18/2013, 1:54 PM     Recent Labs Lab 05/15/13 0929 05/16/13 0643 05/17/13 1202  NA 139 137 133*  K 3.6* 4.8 4.2  CL 95* 97 93*  CO2 25 27 24   GLUCOSE 94 88 112*  BUN 34* 17 26*  CREATININE 7.81* 4.66* 6.71*  CALCIUM 9.1 9.4 9.2  PHOS 5.1* 3.1 3.9    Recent Labs Lab 05/14/13 1010 05/15/13 0929 05/16/13 0643 05/17/13 1202  AST 12  --   --   --   ALT 9  --   --   --   ALKPHOS 87  --   --   --   BILITOT 0.7  --   --   --   PROT 7.6  --   --   --   ALBUMIN 2.3* 2.1* 2.2* 2.1*    Recent Labs Lab 05/15/13 0531 05/16/13 0643 05/17/13 0633  WBC 9.7 10.6* 9.4  NEUTROABS 7.4 8.1* 6.8  HGB 8.6* 9.3* 9.4*  HCT 28.0* 30.1* 31.1*  MCV 85.6 86.2 86.9  PLT 318 307 305   . calcium acetate  667 mg Oral TID WC  . colchicine  0.3 mg Oral Daily  . [START ON 05/19/2013] darbepoetin (ARANESP) injection - DIALYSIS  60 mcg Intravenous Q Wed-HD  . heparin  20 Units/kg Dialysis Once in dialysis  . levothyroxine  175 mcg Oral QAC breakfast  . multivitamin  1 tablet Oral QHS   . vancomycin  1,000 mg Intravenous Q M,W,F-HD   . sodium chloride 10 mL/hr (05/14/13 1807)   sodium chloride, sodium chloride, feeding supplement (NEPRO CARB STEADY), heparin, HYDROmorphone (DILAUDID) injection, lidocaine (PF), lidocaine-prilocaine, loperamide, methocarbamol (ROBAXIN) IV, methocarbamol, metoCLOPramide (REGLAN) injection, metoCLOPramide, ondansetron (ZOFRAN) IV, ondansetron, oxyCODONE-acetaminophen, pentafluoroprop-tetrafluoroeth

## 2013-05-18 NOTE — Progress Notes (Signed)
INITIAL NUTRITION ASSESSMENT  DOCUMENTATION CODES Per approved criteria  -Non-severe (moderate) malnutrition in the context of chronic illness   Pt meets criteria for non-severe (moderate) malnutrition in the context of chronic illness as evidenced by energy intake <75% x 1 month and mild depletion of muscle mass  INTERVENTION: Continue Nepro Shake PRN Continue renal multivitamin  Encouraged po intake to prevent further weight loss Snacks TID Prostat TID RD to continue to follow nutrition care plan   NUTRITION DIAGNOSIS: Malnutrition related to ESRD as evidenced by pt report of energy intake <75% x 1 month and mild depletion of muscle mass.   Goal: Meet >/=90% of estimated nutrition needs   Monitor:  PO intake, weight trend, labs  Reason for Assessment: Positive Malnutrition Screening Tool Score   68 y.o. male  Admitting Dx: Osteomyelitis abscess ulceration left foot   ASSESSMENT: Patient is a 68 year old male with history of diabetes, hypertension, hypothyroidism, ESRD on HD MWF, MRSA bacteremia.   Pt presents with Osteomyelitis abscess ulceration left foot   Patient is s/p left foot trans metatarsal amputation on 4/13 for gangrene left foot. Discharged 4/17 and brought to ER on 4/18 by daughter because of low BP. Patient was not eating well at home at that time and reported weakness, vomiting and diarrhea. Patient refused rehabilitation.   5/1- Patient is s/p left transtibial amputation  Patient reports weight loss recently. Pt states he usually weighs around 160 lbs. Pt currently weighing 149 lbs post-dialysis. This is an 11 lbs loss but unsure of how much actual weight lost due to recent amputation. Do not have weight on admission prior to amputation.   Patient has not been eating well for the past month. States that he is eating 25-50%. This is confirmed in his chart. Patient states that his family encourages him to eat, but he just doesn't have the motivation.   Diet  recall:  (Poor historian) Breakfast: Eggs & cheese, toast, bacon, sausage Lunch: Ham sandwich (skips most lunches) Dinner: Ham sandwich  Patient states that he does not like Nepro shakes and will not take them. Encouraged patient to consume the food that he can to prevent further weight loss. Educated patient on the importance of protein intake and wound healing.   Patient to be transferred to rehab.   Nutrition Focused Physical Exam:  Subcutaneous Fat:  Orbital Region: wnl Upper Arm Region: wnl Thoracic and Lumbar Region: wnl  Muscle:  Temple Region: wnl Clavicle Bone Region: mild depletion  Clavicle and Acromion Bone Region: mild depletion  Scapular Bone Region: mild depletion Dorsal Hand: wnl Patellar Region: wnl Anterior Thigh Region: wnl Posterior Calf Region: wnl  Edema: none   Low sodium Elevated BUN, Cr Phosphorus WNL   Height: Ht Readings from Last 1 Encounters:  05/05/13 '5\' 10"'  (1.778 m)    Weight: Wt Readings from Last 1 Encounters:  05/17/13 149 lb 4 oz (67.7 kg)  .  Ideal Body Weight: 70.9 kg, adjusted for L BKA  % Ideal Body Weight: 95%   Wt Readings from Last 10 Encounters:  05/17/13 149 lb 4 oz (67.7 kg)  05/17/13 149 lb 4 oz (67.7 kg)  05/07/13 158 lb 15.2 oz (72.1 kg)  04/30/13 159 lb 6.3 oz (72.3 kg)  04/30/13 159 lb 6.3 oz (72.3 kg)  04/30/13 159 lb 6.3 oz (72.3 kg)  04/30/13 159 lb 6.3 oz (72.3 kg)  04/07/13 198 lb (89.812 kg)  04/07/13 198 lb (89.812 kg)  04/06/13 189 lb (85.73 kg)  Usual Body Weight: 160 lbs    % Usual Body Weight: 93%   BMI:  Body Mass Index is 22.7 kg/(m^2), adjusted for L BKA   Estimated Nutritional Needs: Kcal: 2000-2200 Protein: 100-115 grams  Fluid: >/= 1.2 L/day  Skin: Stage II Pressure Ulcer right and left buttocks   Diet Order: Carb Control  EDUCATION NEEDS: -Education needs addressed   Intake/Output Summary (Last 24 hours) at 05/18/13 1413 Last data filed at 05/18/13 1300  Gross per 24  hour  Intake    600 ml  Output    393 ml  Net    207 ml    Last BM: 5/5  Labs:   Recent Labs Lab 05/15/13 0929 05/16/13 0643 05/17/13 1202  NA 139 137 133*  K 3.6* 4.8 4.2  CL 95* 97 93*  CO2 '25 27 24  ' BUN 34* 17 26*  CREATININE 7.81* 4.66* 6.71*  CALCIUM 9.1 9.4 9.2  PHOS 5.1* 3.1 3.9  GLUCOSE 94 88 112*    CBG (last 3)   Recent Labs  05/17/13 2128 05/18/13 0637 05/18/13 1205  GLUCAP 117* 90 102*    Scheduled Meds: . calcium acetate  667 mg Oral TID WC  . colchicine  0.3 mg Oral Daily  . [START ON 05/21/2013] darbepoetin (ARANESP) injection - DIALYSIS  60 mcg Intravenous Q Fri-HD  . heparin  20 Units/kg Dialysis Once in dialysis  . levothyroxine  175 mcg Oral QAC breakfast  . multivitamin  1 tablet Oral QHS  . vancomycin  1,000 mg Intravenous Q M,W,F-HD    Continuous Infusions: . sodium chloride 10 mL/hr (05/14/13 1807)    Past Medical History  Diagnosis Date  . Hypertension   . Pneumonia     2012  . Heart murmur   . Glaucoma   . Multiple myeloma, without mention of having achieved remission 03/30/2012    Cytogenetic neg on 03/23/2012.  . Asthma   . Hyperparathyroidism, secondary renal   . Peripheral arterial disease   . End-stage renal disease on hemodialysis     Started HD March 2014.  Cause of ESRD was DM.  Gets HD at [facility] on [place] on MWF schedule.   . Diabetes mellitus without complication     Type 1  . Thyroid disease     hyperparathyroidism  . MRSA bacteremia   . Anemia   . Peripheral vascular disease, unspecified 11/19/2012    In the past had R foot toe amps then R TMA. In 2015 had left foot toe amp > then TMA >then L BKA on 05/14/13   . History of MRSA infection 04/22/2013    Bacteremia assoc w L foot wound infection Mar 2015     Past Surgical History  Procedure Laterality Date  . Thyroidectomy    . Cervical disc surgery    . Eye surgery      CATARACTS  . Insertion of dialysis catheter Right 03/19/2012    Procedure: INSERTION  OF DIALYSIS CATHETER;  Surgeon: Mal Misty, MD;  Location: [place];  Service: Vascular;  Laterality: Right;  Right Internal Jugular  . Av fistula placement Left 03/25/2012    Procedure: ARTERIOVENOUS (AV) FISTULA CREATION;  Surgeon: Mal Misty, MD;  Location: [place];  Service: Vascular;  Laterality: Left;  . Ligation of competing branches of arteriovenous fistula Left 05/08/2012    Procedure: LIGATION OF COMPETING BRANCHES OF ARTERIOVENOUS FISTULA;  Surgeon: Mal Misty, MD;  Location: [place];  Service: Vascular;  Laterality:  Left;  Ultrasound guided  . Cardiac catheterization      approx 30 years ago  . Amputation Right 11/10/2012    Procedure: AMPUTATION FIRST and SECOND TOES Right Foot;  Surgeon: Elam Dutch, MD;  Location: [facility] OR;  Service: Vascular;  Laterality: Right;  . Transmetatarsal amputation Left 12/16/2012    Procedure: TRANSMETATARSAL AMPUTATION AND VAC PLACEMENT;  Surgeon: Elam Dutch, MD;  Location: [place];  Service: Vascular;  Laterality: Left;  . Amputation Left 04/07/2013    Procedure: AMPUTATION DIGIT- LEFT 1ST TOE;  Surgeon: Mal Misty, MD;  Location: [place];  Service: Vascular;  Laterality: Left;  . Tee without cardioversion N/A 04/20/2013    Procedure: TRANSESOPHAGEAL ECHOCARDIOGRAM (TEE);  Surgeon: Josue Hector, MD;  Location: [facility] ENDOSCOPY;  Service: Cardiovascular;  Laterality: N/A;  . I&d extremity Left 04/22/2013    Procedure: [id number] VKF DEBRIDEMENT LEFT FIRST TOE AMPUTATION WOUND ;  Surgeon: Mal Misty, MD;  Location: [place];  Service: Vascular;  Laterality: Left;  . Amputation Left 04/26/2013    Procedure: Left Foot Transmetatarsal Amputation;  Surgeon: Newt Minion, MD;  Location: [place];  Service: Orthopedics;  Laterality: Left;  . Toe amputation      D/C 04-30-13  . Below knee leg amputation Left 05/14/2013    DR [place], BS Dietetic Intern Pager: [phone]  Intern note/chart reviewed. Revisions made.  [place],  [place], [place] Pager [phone] After Hours Pager

## 2013-05-18 NOTE — H&P (Signed)
Physical Medicine and Rehabilitation Admission H&P    CC:  Left BKA and h/o Right transmetatarsal amputation  HPI:  Jack [name] is a 68 y.o. male with h/o of MM, ESRD-HD, DM type 2 with insensate neuropathy, recent MRSA bacteremia and poorly healing L-transmet amputation wound with progressive gangrenous changes. Was discharged to [facility] for therapy and antibiotics. He continued to have poor wound healing and was admitted on 05/14/13 for L-BKA by Dr. Sharol Given. Post op HD ongoing and transfused of acute on chronic anemia. To continue IV vancomycin through 05/28/13 for bacteremia from foot wound. Attempted to get OOB independently and sustained a fall this am. Therapy working on balance as pregait activities. CIR recommended by rehab team and patient admitted today.    Review of Systems  HENT: Negative for hearing loss.   Eyes: Negative for blurred vision and double vision.  Respiratory: Positive for shortness of breath. Negative for cough.   Cardiovascular: Negative for chest pain and palpitations.  Gastrointestinal: Negative for heartburn, nausea and constipation.  Musculoskeletal: Positive for joint pain. Negative for back pain and myalgias.  Neurological: Positive for weakness. Negative for headaches.  Psychiatric/Behavioral: The patient is not nervous/anxious and does not have insomnia.     Past Medical History  Diagnosis Date  . Hypertension   . Pneumonia     2012  . Heart murmur   . Glaucoma   . Multiple myeloma, without mention of having achieved remission 03/30/2012    Cytogenetic neg on 03/23/2012.  . Asthma   . Hyperparathyroidism, secondary renal   . Peripheral arterial disease   . End-stage renal disease on hemodialysis     Started HD March 2014.  Cause of ESRD was DM.  Gets HD at [facility] on [place] on MWF schedule.   . Diabetes mellitus without complication     Type 1  . Thyroid disease     hyperparathyroidism  . MRSA bacteremia   . Anemia   .  Peripheral vascular disease, unspecified 11/19/2012    In the past had R foot toe amps then R TMA. In 2015 had left foot toe amp > then TMA >then L BKA on 05/14/13   . History of MRSA infection 04/22/2013    Bacteremia assoc w L foot wound infection Mar 2015    Past Surgical History  Procedure Laterality Date  . Thyroidectomy    . Cervical disc surgery    . Eye surgery      CATARACTS  . Insertion of dialysis catheter Right 03/19/2012    Procedure: INSERTION OF DIALYSIS CATHETER;  Surgeon: Mal Misty, MD;  Location: [place];  Service: Vascular;  Laterality: Right;  Right Internal Jugular  . Av fistula placement Left 03/25/2012    Procedure: ARTERIOVENOUS (AV) FISTULA CREATION;  Surgeon: Mal Misty, MD;  Location: [place];  Service: Vascular;  Laterality: Left;  . Ligation of competing branches of arteriovenous fistula Left 05/08/2012    Procedure: LIGATION OF COMPETING BRANCHES OF ARTERIOVENOUS FISTULA;  Surgeon: Mal Misty, MD;  Location: [place];  Service: Vascular;  Laterality: Left;  Ultrasound guided  . Cardiac catheterization      approx 30 years ago  . Amputation Right 11/10/2012    Procedure: AMPUTATION FIRST and SECOND TOES Right Foot;  Surgeon: Elam Dutch, MD;  Location: [place];  Service: Vascular;  Laterality: Right;  . Transmetatarsal amputation Left 12/16/2012    Procedure: TRANSMETATARSAL AMPUTATION AND VAC PLACEMENT;  Surgeon: Jessy Oto  Fields, MD;  Location: [place];  Service: Vascular;  Laterality: Left;  . Amputation Left 04/07/2013    Procedure: AMPUTATION DIGIT- LEFT 1ST TOE;  Surgeon: Mal Misty, MD;  Location: [place];  Service: Vascular;  Laterality: Left;  . Tee without cardioversion N/A 04/20/2013    Procedure: TRANSESOPHAGEAL ECHOCARDIOGRAM (TEE);  Surgeon: Josue Hector, MD;  Location: [facility] ENDOSCOPY;  Service: Cardiovascular;  Laterality: N/A;  . I&d extremity Left 04/22/2013    Procedure: [id number] DGL DEBRIDEMENT LEFT FIRST TOE AMPUTATION WOUND ;  Surgeon: Mal Misty, MD;  Location: [place];  Service: Vascular;  Laterality: Left;  . Amputation Left 04/26/2013    Procedure: Left Foot Transmetatarsal Amputation;  Surgeon: Newt Minion, MD;  Location: [place];  Service: Orthopedics;  Laterality: Left;  . Toe amputation      D/C 04-30-13  . Below knee leg amputation Left 05/14/2013    DR [name]   Family History  Problem Relation Age of Onset  . Diabetes Mother   . Cancer Mother     bone   . Kidney disease Father    Social History: Lives with family. Independent prior to 04/2013. He reports that he quit smoking about 31 years ago. His smoking use included Cigarettes. He smoked 0.00 packs per day. He has never used smokeless tobacco. He reports that he does not drink alcohol or use illicit drugs   Allergies  Allergen Reactions  . Bee Venom Anaphylaxis  . Lisinopril Cough  . Ivp Dye [Iodinated Diagnostic Agents] Hives  . Morphine And Related Other (See Comments)    Bradycardia states patient    Medications Prior to Admission  Medication Sig Dispense Refill  . aspirin EC 81 MG tablet Take 81 mg by mouth daily.      Marland Kitchen b complex-vitamin c-folic acid (NEPHRO-VITE) 0.8 MG TABS tablet Take 1 tablet by mouth daily.      . calcium acetate (PHOSLO) 667 MG capsule Take 1 capsule (667 mg total) by mouth 3 (three) times daily with meals.  90 capsule  0  . colchicine 0.6 MG tablet Take 0.5 tablets (0.3 mg total) by mouth daily.      Marland Kitchen doxazosin (CARDURA) 2 MG tablet Take 2 mg by mouth at bedtime.      Marland Kitchen doxercalciferol (HECTOROL) 4 MCG/2ML injection Inject 0.5 mLs (1 mcg total) into the vein every Monday, Wednesday, and Friday with hemodialysis.  2 mL    . feeding supplement, RESOURCE BREEZE, (RESOURCE BREEZE) LIQD Take 1 Container by mouth 3 (three) times daily between meals.    0  . levothyroxine (SYNTHROID, LEVOTHROID) 175 MCG tablet Take 175 mcg by mouth daily before breakfast.      . multivitamin (RENA-VIT) TABS tablet Take 1 tablet by mouth at bedtime.  30  tablet  0  . nebivolol (BYSTOLIC) 5 MG tablet Take 5 mg by mouth at bedtime.      . saxagliptin HCl (ONGLYZA) 5 MG TABS tablet Take 5 mg by mouth daily.       . sodium chloride 0.9 % SOLN 100 mL with ferric gluconate 12.5 MG/ML SOLN 125 mg Inject 125 mg into the vein every Monday, Wednesday, and Friday with hemodialysis.      Marland Kitchen vancomycin (VANCOCIN) 1 GM/200ML SOLN Inject 200 mLs (1,000 mg total) into the vein every Monday, Wednesday, and Friday with hemodialysis. Will be stopped by infectious disease  4000 mL    . [DISCONTINUED] oxyCODONE (OXY IR/ROXICODONE) 5 MG immediate release  tablet Take 1 tablet (5 mg total) by mouth every 6 (six) hours as needed for severe pain.  10 tablet  0  . loperamide (IMODIUM) 2 MG capsule Take 1 capsule (2 mg total) by mouth as needed for diarrhea or loose stools.  30 capsule  0    Home: Home Living Family/patient expects to be discharged to:: Inpatient rehab Living Arrangements: Children Available Help at Discharge: Family;Available 24 hours/day Type of Home: Apartment Home Access: Level entry Home Layout: One level Home Equipment: Walker - 2 wheels;Cane - single point;Shower seat;Wheelchair - manual Additional Comments: Pt recently transferred from [facility]. Pt working with OT / PT at SNF   Functional History: Prior Function Level of Independence: Needs assistance Gait / Transfers Assistance Needed: sit<>stand transfer to w/c only ADL's / Homemaking Assistance Needed: requires (A) for bathing/ dressing Comments: requires use of w/c since 4/13 amputation  Functional Status:  Mobility: Bed Mobility Overal bed mobility: Needs Assistance Bed Mobility: Supine to Sit;Sit to Supine Rolling: Modified independent (Device/Increase time) Supine to sit: Min assist Sit to supine: Supervision General bed mobility comments: Min A to control trunk. Transfers Overall transfer level: Needs assistance Transfers: Sit to/from Stand Sit to Stand: +2 physical  assistance;Mod assist Squat pivot transfers: Mod assist;+2 physical assistance General transfer comment: A for anterior shift and to block R LE. A for SPT to shift weight as patient unable to stand fully upright.       ADL: ADL Overall ADL's : Needs assistance/impaired Lower Body Dressing: Total assistance;Sit to/from stand Functional mobility during ADLs: Moderate assistance (sit <.stand this session x2) General ADL Comments: Pt supine on arrival and educated on reason for OT session. with daughters encouragement engaged in therapy session. pt with delayed processing and slow to initiate any task. pt sitting EOB and terminating task and returning supine due to decr attention to task. Pt returning to eob with encouragement. pt completed sit<>stand x2 this session one person (A). Pt reports fatigue more than normal after HD.  Cognition: Cognition Overall Cognitive Status: Impaired/Different from baseline Orientation Level: Oriented to person;Disoriented to time Cognition Arousal/Alertness: Awake/alert Behavior During Therapy: Flat affect Overall Cognitive Status: Impaired/Different from baseline Area of Impairment: Safety/judgement;Awareness;Problem solving;Memory Current Attention Level: Sustained Memory: Decreased short-term memory;Decreased recall of precautions Following Commands: Follows one step commands with increased time Safety/Judgement: Decreased awareness of deficits Awareness: Emergent Problem Solving: Slow processing;Difficulty sequencing General Comments: Pt with poor recall of NWB LT LE, needs extended time to initiate task. Daughter encouraging patient to initiate and pt responding harshly to encouragement during session. "thats enough" in a strong tone to daughter  Physical Exam: Blood pressure 104/57, pulse 75, temperature 98.1 F (36.7 C), temperature source Oral, resp. rate 18, weight 67.7 kg (149 lb 4 oz), SpO2 100.00%. Physical Exam Nursing note and vitals  reviewed.  Constitutional: He is oriented to person, place, and time.  Thin male--kept eyes closed thorough the exam with minimal interaction.  HENT: oral mucosa pink/moist Head: Normocephalic and atraumatic.  Eyes: Conjunctivae are normal.  Neck: Normal range of motion. Neck supple.  Cardiovascular: Normal rate and regular rhythm. No murmurs Respiratory: Effort normal and breath sounds normal. No wheezes rales or rhonchi GI: Soft. Bowel sounds are normal.  Musculoskeletal: He exhibits tenderness (left BKA site. ).  Well healed right transmet site. Left hand with thenar atrophy and mild contracture. Left BKA with compressive dressing and tends to keep flexed.  Neurological: He is oriented to person, place, and time.  Flat affect. remains a little confused. Follows basic commands without difficulty. Improving insight.  Stocking-glove peripheral neuropathy. RLE weakness noted.  Skin: Skin is warm and dry.  Psychiatric: His affect is blunt. He is withdrawn.     Results for orders placed during the hospital encounter of 05/14/13 (from the past 48 hour(s))  GLUCOSE, CAPILLARY     Status: Abnormal   Collection Time    05/16/13  4:37 PM      Result Value Ref Range   Glucose-Capillary 113 (*) 70 - 99 mg/dL  GLUCOSE, CAPILLARY     Status: None   Collection Time    05/16/13 10:42 PM      Result Value Ref Range   Glucose-Capillary 96  70 - 99 mg/dL   Comment 1 Documented in Chart     Comment 2 Notify RN    GLUCOSE, CAPILLARY     Status: None   Collection Time    05/17/13  6:11 AM      Result Value Ref Range   Glucose-Capillary 89  70 - 99 mg/dL   Comment 1 Documented in Chart     Comment 2 Notify RN    CBC WITH DIFFERENTIAL     Status: Abnormal   Collection Time    05/17/13  6:33 AM      Result Value Ref Range   WBC 9.4  4.0 - 10.5 K/uL   RBC 3.58 (*) 4.22 - 5.81 MIL/uL   Hemoglobin 9.4 (*) 13.0 - 17.0 g/dL   HCT 31.1 (*) 39.0 - 52.0 %   MCV 86.9  78.0 - 100.0 fL   MCH 26.3  26.0  - 34.0 pg   MCHC 30.2  30.0 - 36.0 g/dL   RDW 15.4  11.5 - 15.5 %   Platelets 305  150 - 400 K/uL   Neutrophils Relative % 73  43 - 77 %   Neutro Abs 6.8  1.7 - 7.7 K/uL   Lymphocytes Relative 16  12 - 46 %   Lymphs Abs 1.5  0.7 - 4.0 K/uL   Monocytes Relative 10  3 - 12 %   Monocytes Absolute 0.9  0.1 - 1.0 K/uL   Eosinophils Relative 1  0 - 5 %   Eosinophils Absolute 0.1  0.0 - 0.7 K/uL   Basophils Relative 0  0 - 1 %   Basophils Absolute 0.0  0.0 - 0.1 K/uL  GLUCOSE, CAPILLARY     Status: Abnormal   Collection Time    05/17/13 11:17 AM      Result Value Ref Range   Glucose-Capillary 114 (*) 70 - 99 mg/dL  RENAL FUNCTION PANEL     Status: Abnormal   Collection Time    05/17/13 12:02 PM      Result Value Ref Range   Sodium 133 (*) 137 - 147 mEq/L   Potassium 4.2  3.7 - 5.3 mEq/L   Chloride 93 (*) 96 - 112 mEq/L   CO2 24  19 - 32 mEq/L   Glucose, Bld 112 (*) 70 - 99 mg/dL   BUN 26 (*) 6 - 23 mg/dL   Creatinine, Ser 6.71 (*) 0.50 - 1.35 mg/dL   Calcium 9.2  8.4 - 10.5 mg/dL   Phosphorus 3.9  2.3 - 4.6 mg/dL   Albumin 2.1 (*) 3.5 - 5.2 g/dL   GFR calc non Af Amer 7 (*) >90 mL/min   GFR calc Af Amer 9 (*) >90 mL/min   Comment: (NOTE)  The eGFR has been calculated using the CKD EPI equation.     This calculation has not been validated in all clinical situations.     eGFR's persistently <90 mL/min signify possible Chronic Kidney     Disease.  GLUCOSE, CAPILLARY     Status: Abnormal   Collection Time    05/17/13  4:37 PM      Result Value Ref Range   Glucose-Capillary 121 (*) 70 - 99 mg/dL  GLUCOSE, CAPILLARY     Status: Abnormal   Collection Time    05/17/13  9:28 PM      Result Value Ref Range   Glucose-Capillary 117 (*) 70 - 99 mg/dL  GLUCOSE, CAPILLARY     Status: None   Collection Time    05/18/13  6:37 AM      Result Value Ref Range   Glucose-Capillary 90  70 - 99 mg/dL  GLUCOSE, CAPILLARY     Status: Abnormal   Collection Time    05/18/13 12:05 PM       Result Value Ref Range   Glucose-Capillary 102 (*) 70 - 99 mg/dL   No results found.     Medical Problem List and Plan: 1. Functional deficits secondary to  L-BKA 2.  DVT Prophylaxis/Anticoagulation: Pharmaceutical: Lovenox 3. Pain Management:  Reports pain with mobility and with dependency of LLE. Will premedicate prior to therapy sessions to help with participation and tolerance of therapy. Will add neurontin 150m/hs to help with neuropathic pain---watch closely for mentation. 4. Mood:  Flat affect with poor interaction--will have LCSW follow with patient and family for evaluation and support.  5. Neuropsych: This patient is not yet capable of making decisions on his own behalf. 6. ESRD:  Continue HD on Mon, Wed, Fri.  7. Hypotension: BP medications discontinued. Continue to monitor BP every 8 hours.  8. Anemia of chronic disease: Continue aranesp. Routine check of H/H with HD to monitor for stability/trends.  9. MRSA bacteremia: IV vancomycin initiated 04/17/13--8 weeks IV antibiotics recommended by ID.     Post Admission Physician Evaluation: 1. Functional deficits secondary  to left BKA. 2. Patient is admitted to receive collaborative, interdisciplinary care between the physiatrist, rehab nursing staff, and therapy team. 3. Patient's level of medical complexity and substantial therapy needs in context of that medical necessity cannot be provided at a lesser intensity of care such as a SNF. 4. Patient has experienced substantial functional loss from his/her baseline which was documented above under the "Functional History" and "Functional Status" headings.  Judging by the patient's diagnosis, physical exam, and functional history, the patient has potential for functional progress which will result in measurable gains while on inpatient rehab.  These gains will be of substantial and practical use upon discharge  in facilitating mobility and self-care at the household  level. 5. Physiatrist will provide 24 hour management of medical needs as well as oversight of the therapy plan/treatment and provide guidance as appropriate regarding the interaction of the two. 6. 24 hour rehab nursing will assist with bladder management, bowel management, safety, skin/wound care, disease management, medication administration, pain management and patient education  and help integrate therapy concepts, techniques,education, etc. 7. PT will assess and treat for/with: Lower extremity strength, range of motion, stamina, balance, functional mobility, safety, adaptive techniques and equipment, pain mgt, pre-prosthetic education, adjustment of schedule to allow for PM HD, egosupport and positive reinforcement.   Goals are: supervision. 8. OT will assess and treat for/with: ADL's, functional mobility, safety, upper  extremity strength, adaptive techniques and equipment, pain mgt, pacing and schedule adjustment to account for PM HD, ego support and Positive reinforcement, .   Goals are: supervision to perhaps min assist. 9. SLP will assess and treat for/with: n/a at this time.  Goals are: n/a--consider assessment depending upon presentation as we move forward. 10. Case Management and Social Worker will assess and treat for psychological issues and discharge planning. 11. Team conference will be held weekly to assess progress toward goals and to determine barriers to discharge. 12. Patient will receive at least 3 hours of therapy per day at least 5 days per week. 13. ELOS: 14 days       14. Prognosis:  good     Meredith Staggers, MD, [facility]   05/18/2013

## 2013-05-18 NOTE — Progress Notes (Signed)
Rehab admissions - Evaluated for possible admission.  I met with patient and his daughter.  They would like inpatient rehab admission.  I have called and faxed information to [facility] requesting inpatient rehab admission.  I will inform all once I hear back from insurance carrier.  Call me for questions.  #[phone]

## 2013-05-18 NOTE — Progress Notes (Signed)
Pt found in floor had gotten up in recliner  By Pt when they left the rm he thought he could walk to bed got up slid in floor v/s stable 142/94 98.4 85 18 Dr Sharol Given aware no further orders.Dr Naaman Plummer in rm wanted to check lt stump staples intact stump rewraped

## 2013-05-18 NOTE — Interval H&P Note (Signed)
Jack [name] was admitted today to Inpatient Rehabilitation with the diagnosis of left BKA.  The patient's history has been reviewed, patient examined, and there is no change in status.  Patient continues to be appropriate for intensive inpatient rehabilitation.  I have reviewed the patient's chart and labs.  Questions were answered to the patient's satisfaction.  Meredith Staggers 05/18/2013, 8:17 PM

## 2013-05-19 ENCOUNTER — Inpatient Hospital Stay (HOSPITAL_COMMUNITY): Payer: Medicare Other

## 2013-05-19 ENCOUNTER — Inpatient Hospital Stay (HOSPITAL_COMMUNITY): Payer: Medicare Other | Admitting: Rehabilitation

## 2013-05-19 ENCOUNTER — Inpatient Hospital Stay (HOSPITAL_COMMUNITY): Payer: Medicare Other | Admitting: Occupational Therapy

## 2013-05-19 DIAGNOSIS — N186 End stage renal disease: Secondary | ICD-10-CM

## 2013-05-19 DIAGNOSIS — D631 Anemia in chronic kidney disease: Secondary | ICD-10-CM

## 2013-05-19 DIAGNOSIS — L98499 Non-pressure chronic ulcer of skin of other sites with unspecified severity: Secondary | ICD-10-CM

## 2013-05-19 DIAGNOSIS — S88119A Complete traumatic amputation at level between knee and ankle, unspecified lower leg, initial encounter: Secondary | ICD-10-CM

## 2013-05-19 DIAGNOSIS — N039 Chronic nephritic syndrome with unspecified morphologic changes: Secondary | ICD-10-CM

## 2013-05-19 DIAGNOSIS — I10 Essential (primary) hypertension: Secondary | ICD-10-CM

## 2013-05-19 DIAGNOSIS — I739 Peripheral vascular disease, unspecified: Secondary | ICD-10-CM

## 2013-05-19 LAB — GLUCOSE, CAPILLARY
GLUCOSE-CAPILLARY: 96 mg/dL (ref 70–99)
Glucose-Capillary: 106 mg/dL — ABNORMAL HIGH (ref 70–99)
Glucose-Capillary: 135 mg/dL — ABNORMAL HIGH (ref 70–99)
Glucose-Capillary: 121 mg/dL — ABNORMAL HIGH (ref 70–99)

## 2013-05-19 MED ORDER — PRO-STAT SUGAR FREE PO LIQD
30.0000 mL | Freq: Three times a day (TID) | ORAL | Status: DC
  Administered 2013-05-19 – 2013-05-29 (×23): 30 mL via ORAL
  Filled 2013-05-19 (×32): qty 30

## 2013-05-19 MED ORDER — LANTHANUM CARBONATE 500 MG PO CHEW
1000.0000 mg | CHEWABLE_TABLET | Freq: Three times a day (TID) | ORAL | Status: DC
  Administered 2013-05-19 – 2013-05-29 (×13): 1000 mg via ORAL
  Filled 2013-05-19 (×32): qty 2

## 2013-05-19 NOTE — Progress Notes (Signed)
Jack [name] PHYSICAL MEDICINE & REHABILITATION     PROGRESS NOTE    Subjective/Complaints: Had a good night. Pain controllled. Ready for therapies today.  A 12 point review of systems has been performed and if not noted above is otherwise negative.   Objective: Vital Signs: Blood pressure 94/51, pulse 65, temperature 97 F (36.1 C), temperature source Oral, resp. rate 16, height 5\' 10"  (1.778 m), weight 70 kg (154 lb 5.2 oz), SpO2 100.00%. No results found.  Recent Labs  05/17/13 0633  WBC 9.4  HGB 9.4*  HCT 31.1*  PLT 305    Recent Labs  05/17/13 1202  NA 133*  K 4.2  CL 93*  GLUCOSE 112*  BUN 26*  CREATININE 6.71*  CALCIUM 9.2   CBG (last 3)   Recent Labs  05/18/13 1632 05/18/13 2038 05/19/13 0700  GLUCAP 108* 137* 106*    Wt Readings from Last 3 Encounters:  05/19/13 70 kg (154 lb 5.2 oz)  05/17/13 67.7 kg (149 lb 4 oz)  05/17/13 67.7 kg (149 lb 4 oz)    Physical Exam:  Constitutional: He is oriented to person, place, and time.  Thin male-  HENT: oral mucosa pink/moist  Head: Normocephalic and atraumatic.  Eyes: Conjunctivae are normal.  Neck: Normal range of motion. Neck supple.  Cardiovascular: Normal rate and regular rhythm. No murmurs  Respiratory: Effort normal and breath sounds normal. No wheezes rales or rhonchi  GI: Soft. Bowel sounds are normal.  Musculoskeletal: He exhibits tenderness (left BKA site. )--tolerated dressing change.  Well healed right transmet site. Left hand with thenar atrophy and mild contracture. Left BKA with compressive dressing and tends to keep flexed.  Neurological: He is oriented to person, place, and time.  Appropriate affect. remains a little confused. Follows basic commands without difficulty. Improving insight. Stocking-glove peripheral neuropathy. RLE weakness noted.  Skin: Skin is warm and dry. Wound clean and intact with minimal drainage. Psychiatric: His affect is blunt. He is withdrawn.     Assessment/Plan: 1. Functional deficits secondary to left BKA which require 3+ hours per day of interdisciplinary therapy in a comprehensive inpatient rehab setting. Physiatrist is providing close team supervision and 24 hour management of active medical problems listed below. Physiatrist and rehab team continue to assess barriers to discharge/monitor patient progress toward functional and medical goals. FIM:                   Comprehension Comprehension: 5-Understands basic 90% of the time/requires cueing < 10% of the time  Expression Expression Mode: Verbal Expression: 4-Expresses basic 75 - 89% of the time/requires cueing 10 - 24% of the time. Needs helper to occlude trach/needs to repeat words.  Social Interaction Social Interaction: 5-Interacts appropriately 90% of the time - Needs monitoring or encouragement for participation or interaction.  Problem Solving Problem Solving: 3-Solves basic 50 - 74% of the time/requires cueing 25 - 49% of the time  Memory Memory: 4-Recognizes or recalls 75 - 89% of the time/requires cueing 10 - 24% of the time  Medical Problem List and Plan:  1. Functional deficits secondary to L-BKA  2. DVT Prophylaxis/Anticoagulation: Pharmaceutical: Lovenox  3. Pain Management: Reports pain with mobility and with dependency of LLE. Doing well so far today. continue neurontin 100mg /hs to help with neuropathic pain---watch closely for mentation.  4. Mood: Flat affect with poor interaction--will have LCSW follow with patient and family for evaluation and support.  5. Neuropsych: This patient is capable of making decisions on his  own behalf.  6. ESRD: Continue HD on Mon, Wed, Fri.  7. Hypotension: BP medications discontinued. Continue to monitor BP every 8 hours.  8. Anemia of chronic disease: Continue aranesp. Routine check of H/H with HD to monitor for stability/trends.  9. MRSA bacteremia: IV vancomycin initiated 04/17/13--8 weeks IV antibiotics  recommended by ID  LOS (Days) 1 A FACE TO FACE EVALUATION WAS PERFORMED  Meredith Staggers 05/19/2013 7:45 AM

## 2013-05-19 NOTE — Progress Notes (Signed)
Checking vitals for fall follow up protocol and noted pt o2 sat 81% on RA. PT in no resp distress however reported tired and note slow moving with therapy. [name] PAC notified of findings, no new orders received. Monitor for now and see what transpires with therapy session. Jack [name]

## 2013-05-19 NOTE — Progress Notes (Signed)
Physical Therapy Session Note  Patient Details  Name: KALON ERHARDT MRN: [id number] Date of Birth: 09/11/1945  Today's Date: 05/19/2013 Time: 3614-4315 Time Calculation (min): 44 min  Short Term Goals: Week 1:  PT Short Term Goal 1 (Week 1): = LTGs due to short LOS  Skilled Therapeutic Interventions/Progress Updates:  Pt received sitting on therapy mat in gym, having just finished previous PT session.  Pt requesting to lie down due to increased fatigue.  Pt able to scoot at supervision level in order to get into supine.  Assisted LLE for safety onto mat.  While in L SL, performed R SL hip abd and hip ext x 10 reps, then had pt lie on back to perform B knee presses x 10 reps with manual facilitation for increased knee ext due to flexor contractions B.  Pt got back into sitting with min assist to elevate trunk and verbal cues for rolling and using UEs in order to push through mat.  Discussed use of slideboard during transfers to increase comfort and ease of transfers.  PT demonstrated use of slideboard prior to use and pt able to return demonstration with light mod assist with continued verbal cues and facilitation for forward weight shift.  Had pt self propel w/c 100' back to room using BUEs in order to increase overall strength and endurance.  Cues for increased size of propulsion to increase energy conservation.  Also provided hand over hand assist when making turns.  Pt got back into room with assist for positioning of chair in order to better align for transfer.  Performed slideboard transfer back to bed as stated above.  Did note increased ability to perform forward weight shift and lean, however requires cues for getting all the way into bed before removing board.  Pt left in bed in SL position with pillow between legs, bed alarm set and all needs in reach.   Therapy Documentation Precautions:  Precautions Precautions: Fall Precaution Comments: Lt BKA NWB; fell 05/19/13 attempting to get  back in bed without assistance Required Braces or Orthoses: Other Brace/Splint Restrictions Weight Bearing Restrictions: Yes LLE Weight Bearing: Non weight bearing Other Position/Activity Restrictions: strict NWB LLE Therapy Vitals Temp: 97.8 F (36.6 C) Temp src: Oral Pulse Rate: 71 Resp: 17 BP: 90/53 mmHg (RN notified) Patient Position, if appropriate: Lying Oxygen Therapy SpO2: 94 % O2 Device: None (Room air) Pain: Pain Assessment Pain Score: 6   See FIM for current functional status  Therapy/Group: Individual Therapy  Raquel Sarna A Leonila Speranza 05/19/2013, 4:13 PM

## 2013-05-19 NOTE — Progress Notes (Signed)
Subjective:   Rehab going better than he expected.  Objective Filed Vitals:   05/19/13 0515 05/19/13 0623 05/19/13 0943 05/19/13 1006  BP: 94/51  104/63   Pulse: 65  77   Temp: 97 F (36.1 C)  98.2 F (36.8 C)   TempSrc: Oral  Oral   Resp: 16  17   Height:      Weight:  70 kg (154 lb 5.2 oz)    SpO2: 100%  81% 98%   Physical Exam General: alert and oriented. No acute distress. Heart: RRR Lungs: CTA, unlabored Abdomen: soft, nontender. +BS Extremities: L BKA with dressing. Old R transmet amp. No edema Dialysis Access: LUA AVF +bruit/thrill  D[facility]  4h 400/800 85.5kg dry wt Heparin 3500 LUA AVF  Assessment/Plan:  1. L transtibial amputation 5/1 by ortho.  dressing dry and intact . Vancomycin Q HD. 2. ESRD - MWF @ [place], HD today. K+ 4.2 3. Anemia - hgb 9.4  1 u PRBC 5/1. Cont Aranesp 60 q friday. Watch CBC 4. Secondary hyperparathyroidism - Ca+9.2 / corrected 10.9, use 2 Ca+ bath. Change phoslo to fosrenol 5. HTN/volume - 104/63 hypotension, meds stopped.  6. Nutrition - alb 2.1. dys diet. multivit.Poor appetite. nepro 7. Multiple myeloma- next onc follow up in June  8. dispo- Now in rehab 9 Severe PVD   Shelle Iron, NP [place] [phone] 05/19/2013,3:19 PM  LOS: 1 day   Pt seen, examined and agree w A/P as above.  [name] MD pager [phone]    cell [phone] 05/19/2013, 3:43 PM     Additional Objective Labs: Basic Metabolic Panel:  Recent Labs Lab 05/15/13 0929 05/16/13 0643 05/17/13 1202  NA 139 137 133*  K 3.6* 4.8 4.2  CL 95* 97 93*  CO2 _0 GLUCOSE 94 88 112*  BUN 34* 17 26*  CREATININE 7.81* 4.66* 6.71*  CALCIUM 9.1 9.4 9.2  PHOS 5.1* 3.1 3.9   Liver Function Tests:  Recent Labs Lab 05/14/13 1010 05/15/13 0929 05/16/13 0643 05/17/13 1202  AST 12  --   --   --   ALT 9  --   --   --   ALKPHOS 87  --   --   --   BILITOT 0.7  --   --   --   PROT 7.6  --   --   --   ALBUMIN 2.3* 2.1*  2.2* 2.1*   No results found for this basename: LIPASE, AMYLASE,  in the last 168 hours CBC:  Recent Labs Lab 05/14/13 1010 05/15/13 0531 05/16/13 0643 05/17/13 0633  WBC 11.5* 9.7 10.6* 9.4  NEUTROABS  --  7.4 8.1* 6.8  HGB 7.7* 8.6* 9.3* 9.4*  HCT 25.3* 28.0* 30.1* 31.1*  MCV 86.1 85.6 86.2 86.9  PLT 303 318 307 305   Blood Culture    Component Value Date/Time   SDES BLOOD RIGHT HAND 05/01/2013 2100   SPECREQUEST BOTTLES DRAWN AEROBIC ONLY [facility] 05/01/2013 2100   CULT  Value: NO GROWTH 5 DAYS Performed at [facility] 05/01/2013 2100   REPTSTATUS 05/08/2013 FINAL 05/01/2013 2100    Cardiac Enzymes: No results found for this basename: CKTOTAL, CKMB, CKMBINDEX, TROPONINI,  in the last 168 hours CBG:  Recent Labs Lab 05/18/13 0637 05/18/13 1205 05/18/13 1632 05/18/13 2038 05/19/13 0700  GLUCAP 90 102* 108* 137* 106*   Iron Studies: No results found for this basename: IRON, TIBC, TRANSFERRIN, FERRITIN,  in the last 72  hours _0 @ Studies/Results: No results found. Medications:   . calcium acetate  667 mg Oral TID WC  . colchicine  0.3 mg Oral Daily  . [START ON 05/21/2013] darbepoetin (ARANESP) injection - DIALYSIS  60 mcg Intravenous Q Fri-HD  . enoxaparin (LOVENOX) injection  30 mg Subcutaneous Q24H  . feeding supplement (PRO-STAT SUGAR FREE 64)  30 mL Oral TID WC  . gabapentin  100 mg Oral QHS  . heparin  20 Units/kg Dialysis Once in dialysis  . levothyroxine  175 mcg Oral QAC breakfast  . multivitamin  1 tablet Oral QHS  . oxyCODONE  10 mg Oral BID  . vancomycin  1,000 mg Intravenous Q M,W,F-HD

## 2013-05-19 NOTE — Plan of Care (Signed)
Problem: RH SKIN INTEGRITY Goal: RH STG ABLE TO PERFORM INCISION/WOUND CARE W/ASSISTANCE STG Able To Perform Incision/Wound Care With [employer].  Outcome: Progressing Turns as staff place cream and foam to buttocks

## 2013-05-19 NOTE — Care Management Note (Signed)
CARE MANAGEMENT NOTE 05/19/2013  Patient:  JEARLD, HEMP   Account Number:  [id number]  Date Initiated:  05/19/2013  Documentation initiated by:  Ricki Miller  Subjective/Objective Assessment:   68 yr old male  s/p left BKA and partial right foot amputation.     Action/Plan:   Patient discharged to CIR for rehab.   Anticipated DC Date:  05/18/2013   Anticipated DC Plan:  IP REHAB FACILITY      DC Planning Services  CM consult      Choice offered to / List presented to:             Status of service:  Completed, signed off Medicare Important Message given?   (If response is "NO", the following Medicare IM given date fields will be blank) Date Medicare IM given:   Date Additional Medicare IM given:    Discharge Disposition:  IP REHAB FACILITY

## 2013-05-19 NOTE — Plan of Care (Signed)
Problem: RH SKIN INTEGRITY Goal: RH STG ABLE TO PERFORM INCISION/WOUND CARE W/ASSISTANCE STG Able To Perform Incision/Wound Care With Assistance.  Outcome: Not Progressing New on unit today,staff currently provide all skin care.will need education

## 2013-05-19 NOTE — Evaluation (Signed)
Occupational Therapy Assessment and Plan  Patient Details  Name: Jack [name] MRN: [id number] Date of Birth: 06-08-1945  OT Diagnosis: acute pain, cognitive deficits and muscle weakness (generalized) Rehab Potential: Rehab Potential: Good ELOS: 7 days   Today's Date: 05/19/2013 Time: 0805-0905 Time Calculation (min): 60 min  Problem List:  Patient Active Problem List   Diagnosis Date Noted  . BKA stump complication 59/56/3875  . Asthma 05/17/2013  . S/P BKA left leg  05/17/2013  . FTT (failure to thrive) in adult 05/01/2013  . History of MRSA infection 04/22/2013  . Peripheral vascular disease, unspecified 11/19/2012  . End-stage renal disease on hemodialysis 05/05/2012  . Anemia in chronic kidney disease 03/19/2012  . Iron deficiency 03/19/2012  . Hypothyroidism 03/19/2012  . Hypertension 03/18/2012  . MGUS (monoclonal gammopathy of unknown significance) 02/28/2011    Past Medical History:  Past Medical History  Diagnosis Date  . Hypertension   . Pneumonia     2012  . Heart murmur   . Glaucoma   . Multiple myeloma, without mention of having achieved remission 03/30/2012    Cytogenetic neg on 03/23/2012.  . Asthma   . Hyperparathyroidism, secondary renal   . Peripheral arterial disease   . End-stage renal disease on hemodialysis     Started HD March 2014.  Cause of ESRD was DM.  Gets HD at [facility] on [address] on MWF schedule.   . Diabetes mellitus without complication     Type 1  . Thyroid disease     hyperparathyroidism  . MRSA bacteremia   . Anemia   . Peripheral vascular disease, unspecified 11/19/2012    In the past had R foot toe amps then R TMA. In 2015 had left foot toe amp > then TMA >then L BKA on 05/14/13   . History of MRSA infection 04/22/2013    Bacteremia assoc w L foot wound infection Mar 2015    Past Surgical History:  Past Surgical History  Procedure Laterality Date  . Thyroidectomy    . Cervical disc surgery    . Eye surgery       CATARACTS  . Insertion of dialysis catheter Right 03/19/2012    Procedure: INSERTION OF DIALYSIS CATHETER;  Surgeon: Mal Misty, MD;  Location: [place];  Service: Vascular;  Laterality: Right;  Right Internal Jugular  . Av fistula placement Left 03/25/2012    Procedure: ARTERIOVENOUS (AV) FISTULA CREATION;  Surgeon: Mal Misty, MD;  Location: [place];  Service: Vascular;  Laterality: Left;  . Ligation of competing branches of arteriovenous fistula Left 05/08/2012    Procedure: LIGATION OF COMPETING BRANCHES OF ARTERIOVENOUS FISTULA;  Surgeon: Mal Misty, MD;  Location: [place];  Service: Vascular;  Laterality: Left;  Ultrasound guided  . Cardiac catheterization      approx 30 years ago  . Amputation Right 11/10/2012    Procedure: AMPUTATION FIRST and SECOND TOES Right Foot;  Surgeon: Elam Dutch, MD;  Location[facility] OR;  Service: Vascular;  Laterality: Right;  . Transmetatarsal amputation Left 12/16/2012    Procedure: TRANSMETATARSAL AMPUTATION AND VAC PLACEMENT;  Surgeon: Elam Dutch, MD;  Location: [place];  Service: Vascular;  Laterality: Left;  . Amputation Left 04/07/2013    Procedure: AMPUTATION DIGIT- LEFT 1ST TOE;  Surgeon: Mal Misty, MD;  Location: [place];  Service: Vascular;  Laterality: Left;  . Tee without cardioversion N/A 04/20/2013    Procedure: TRANSESOPHAGEAL ECHOCARDIOGRAM (TEE);  Surgeon: Josue Hector, MD;  Location: [place] [facility];  Service: Cardiovascular;  Laterality: N/A;  . I&d extremity Left 04/22/2013    Procedure: Welch LEFT FIRST TOE AMPUTATION WOUND ;  Surgeon: Mal Misty, MD;  Location: [place];  Service: Vascular;  Laterality: Left;  . Amputation Left 04/26/2013    Procedure: Left Foot Transmetatarsal Amputation;  Surgeon: Newt Minion, MD;  Location: [place];  Service: Orthopedics;  Laterality: Left;  . Toe amputation      D/C 04-30-13  . Below knee leg amputation Left 05/14/2013    DR [name]  . Amputation Left 05/14/2013    Procedure:  AMPUTATION BELOW KNEE;  Surgeon: Newt Minion, MD;  Location: [place];  Service: Orthopedics;  Laterality: Left;  Left Below Knee Amputation    Assessment & Plan Clinical Impression: [name] is a 68 y.o. male with h/o of MM, ESRD-HD, DM type 2 with insensate neuropathy, recent MRSA bacteremia and poorly healing L-transmet amputation wound with progressive gangrenous changes. Was discharged to [facility] place for therapy and antibiotics. He continued to have poor wound healing and was admitted on 05/14/13 for L-BKA by Dr. Sharol Given. Post op HD ongoing and transfused of acute on chronic anemia. To continue IV vancomycin through 05/28/13 for bacteremia from foot wound. Attempted to get OOB independently and sustained a fall this am. Therapy working on balance as pregait activities. CIR recommended by rehab team and patient admitted today.  Patient transferred to CIR on 05/18/2013 .    Patient currently requires max with basic self-care skills secondary to muscle weakness, decreased cardiorespiratoy endurance and decreased standing balance.  Prior to the amputation of his L toes in early April, patient could complete basic self care without assist.   Patient will benefit from skilled intervention to increase independence with basic self-care skills prior to discharge home with care partner.  Anticipate patient will require intermittent supervision and follow up home health.  OT - End of Session Activity Tolerance: Tolerates 10 - 20 min activity with multiple rests Endurance Deficit: Yes OT Assessment Rehab Potential: Good OT Patient demonstrates impairments in the following area(s): Endurance;Pain;Cognition;Motor;Skin Integrity OT Basic ADL's Functional Problem(s): Bathing;Dressing;Toileting OT Transfers Functional Problem(s): Toilet OT Additional Impairment(s): None OT Plan OT Intensity: Minimum of 1-2 x/day, 45 to 90 minutes OT Frequency: 5 out of 7 days OT Duration/Estimated Length of Stay: 7  days OT Treatment/Interventions: Cognitive remediation/compensation;Discharge planning;DME/adaptive equipment instruction;Functional mobility training;Pain management;Patient/family education;Psychosocial support;Self Care/advanced ADL retraining;Skin care/wound managment;Therapeutic Activities;Therapeutic Exercise;UE/LE Strength taining/ROM;Wheelchair propulsion/positioning OT Self Feeding Anticipated Outcome(s): I OT Basic Self-Care Anticipated Outcome(s): supervision OT Toileting Anticipated Outcome(s): supervision with drop arm BSC OT Bathroom Transfers Anticipated Outcome(s): supervision with drop arm BSC OT Recommendation Patient destination: Home Follow Up Recommendations: Home health OT Equipment Recommended: 3 in 1 bedside comode;Other (comment) (wide drop arm)   Skilled Therapeutic Intervention Pt seen for initial evaluation and ADL retraining of  Bathing and dressing with a focus on functional mobility and endurance. Pt stated he was in pain and very tired.  He sat to EOB but needed to lay down again after sitting just 5 minutes. He got up again and transferred to [facility] with max A to scoot with 2nd person keeping chair stable. Pt demonstrated good sitting balance and posture on BSC.  He bathed and dressed from [facility] then transferred to W/c. Pt tolerated session with several rest breaks.  He was very compliant with working hard and understood the purpose of OT and his LTGs. Pt resting in w/c  with QRB and call light in reach.  OT Evaluation Precautions/Restrictions  Precautions Precautions: Fall Precaution Comments: Lt BKA NWB; fell 05/19/13 attempting to get back in bed without assistance Required Braces or Orthoses: Other Brace/Splint Restrictions Weight Bearing Restrictions: Yes LLE Weight Bearing: Non weight bearing Other Position/Activity Restrictions: strict NWB LLE  Vital Signs Therapy Vitals Temp: 98.2 F (36.8 C) Temp src: Oral Pulse Rate: 77 Resp: 17 BP: 104/63  mmHg Patient Position, if appropriate: Sitting Oxygen Therapy SpO2: 98 % (after pushing w/c) O2 Device: None (Room air) Pain Pain Assessment Pain Assessment: 0-10 Pain Score: 7  Pain Type: Surgical pain Pain Location: Leg Pain Orientation: Left Pain Onset: On-going Patients Stated Pain Goal: 4 Pain Intervention(s): Medication (See eMAR) Home Living/Prior Functioning Home Living Available Help at Discharge: Family;Available 24 hours/day Type of Home: Apartment Home Access: Level entry Home Layout: One level Additional Comments: Pt recently transferred from [facility]. Pt working with OT / PT at SNF Prior Function Level of Independence: Independent with basic ADLs;Requires assistive device for independence;Independent with transfers (Prior to 04/14/13) Vocation: Retired Leisure: Hobbies-no Comments: requires use of w/c since 4/13 amputation ADL ADL ADL Comments: Refer to FIM Vision/Perception  Vision- History Baseline Vision/History: Wears glasses;Glaucoma Wears Glasses: At all times Patient Visual Report: Diplopia (Pt has had diplopia for over 20 years, has prisms in his glasses) Vision- Assessment Vision Assessment?: No apparent visual deficits Perception Comments: WFL  Cognition Overall Cognitive Status: Impaired/Different from baseline Arousal/Alertness: Lethargic Orientation Level: Oriented X4 Memory: Impaired Memory Impairment: Decreased recall of new information Sensation Sensation Light Touch: Impaired Detail Light Touch Impaired Details: Absent RLE (absent distal foot; impaired lower leg in stocking pattern; appears intact L residual limb) Stereognosis: Appears Intact Hot/Cold: Appears Intact Proprioception: Appears Intact (R ankle) Additional Comments: bil fingertips "numb" Coordination Gross Motor Movements are Fluid and Coordinated: Yes Fine Motor Movements are Fluid and Coordinated: Yes Motor  Motor Motor - Skilled Clinical Observations:  Generalized muscle weakness Mobility    refer to FIM Trunk/Postural Assessment  Cervical Assessment Cervical Assessment: Within Functional Limits Thoracic Assessment Thoracic Assessment: Within Functional Limits Lumbar Assessment Lumbar Assessment: Within Functional Limits Postural Control Postural Control: Within Functional Limits  Balance Dynamic Sitting Balance Dynamic Sitting - Level of Assistance: 5: Stand by assistance Static Standing Balance Static Standing - Level of Assistance: Not tested (comment) (Pt unable to come into a stand at this time.) Extremity/Trunk Assessment RUE Assessment RUE Assessment: Exceptions to [facility] RUE Strength RUE Overall Strength: Other (Comment) (deconditioned) RUE Overall Strength Comments: 4-/5 LUE Assessment LUE Assessment: Exceptions to [facility] LUE AROM (degrees) Left Shoulder Flexion: 120 Degrees LUE Strength LUE Overall Strength Comments: 4-/5 Left Shoulder Flexion: 3+/5  FIM:  FIM - Eating Eating Activity: 7: Complete independence:no helper FIM - Grooming Grooming Steps: Wash, rinse, dry face;Wash, rinse, dry hands;Oral care, brush teeth, clean dentures Grooming: 5: Set-up assist to obtain items FIM - Bathing Bathing Steps Patient Completed: Chest;Right Arm;Left Arm;Abdomen;Front perineal area;Buttocks;Right upper leg;Left upper leg (9/9 parts) Bathing: 4: Min-Patient completes 8-9 [id number] parts or 75+ percent FIM - Upper Body Dressing/Undressing Upper body dressing/undressing steps patient completed: Thread/unthread right sleeve of pullover shirt/dresss;Thread/unthread left sleeve of pullover shirt/dress;Put head through opening of pull over shirt/dress;Pull shirt over trunk Upper body dressing/undressing: 5: Set-up assist to: Obtain clothing/put away FIM - Lower Body Dressing/Undressing Lower body dressing/undressing steps patient completed: Pull underwear up/down Lower body dressing/undressing: 2: Max-Patient completed 25-49% of  tasks FIM - Toileting Toileting steps completed by  patient: Performs perineal hygiene Toileting: 2: Max-Patient completed 1 of 3 steps FIM - Bed/Chair Transfer Bed/Chair Transfer: 3: Supine > Sit: Mod A (lifting assist/Pt. 50-74%/lift 2 legs;5: Sit > Supine: Supervision (verbal cues/safety issues);1: Two helpers (extra person to hold w/c steady) FIM - Radio producer Devices: Recruitment consultant Transfers: 2-From toilet/BSC: Max A (lift and lower assist);2-To toilet/BSC: Max A (lift and lower assist);1-Two helpers FIM - Camera operator Transfers: 0-Activity did not occur or was simulated   Refer to Care Plan for Long Term Goals  Recommendations for other services: None  Discharge Criteria: Patient will be discharged from OT if patient refuses treatment 3 consecutive times without medical reason, if treatment goals not met, if there is a change in medical status, if patient makes no progress towards goals or if patient is discharged from hospital.  The above assessment, treatment plan, treatment alternatives and goals were discussed and mutually agreed upon: by patient  Harlene Ramus 05/19/2013, 12:46 PM

## 2013-05-19 NOTE — Evaluation (Signed)
Physical Therapy Assessment and Plan  Patient Details  Name: Jack [name] MRN: [id number] Date of Birth: 03-30-1945  PT Diagnosis: Contracture of joint: R ankle, L knee; Impaired cognition, Impaired sensation, Muscle weakness and Pain in L residual limb Rehab Potential: Good ELOS: 7-10   Today's Date: 05/19/2013 Time: 0930-1030 Time Calculation (min): 60 min  Problem List:  Patient Active Problem List   Diagnosis Date Noted  . BKA stump complication [id number]/[id number]  . Asthma 05/17/2013  . S/P BKA left leg  05/17/2013  . FTT (failure to thrive) in adult 05/01/2013  . History of MRSA infection 04/22/2013  . Peripheral vascular disease, unspecified 11/19/2012  . End-stage renal disease on hemodialysis 05/05/2012  . Anemia in chronic kidney disease 03/19/2012  . Iron deficiency 03/19/2012  . Hypothyroidism 03/19/2012  . Hypertension 03/18/2012  . MGUS (monoclonal gammopathy of unknown significance) 02/28/2011    Past Medical History:  Past Medical History  Diagnosis Date  . Hypertension   . Pneumonia     2012  . Heart murmur   . Glaucoma   . Multiple myeloma, without mention of having achieved remission 03/30/2012    Cytogenetic neg on 03/23/2012.  . Asthma   . Hyperparathyroidism, secondary renal   . Peripheral arterial disease   . End-stage renal disease on hemodialysis     Started HD March 2014.  Cause of ESRD was DM.  Gets HD at [facility] on [place] on MWF schedule.   . Diabetes mellitus without complication     Type 1  . Thyroid disease     hyperparathyroidism  . MRSA bacteremia   . Anemia   . Peripheral vascular disease, unspecified 11/19/2012    In the past had R foot toe amps then R TMA. In 2015 had left foot toe amp > then TMA >then L BKA on 05/14/13   . History of MRSA infection 04/22/2013    Bacteremia assoc w L foot wound infection Mar 2015    Past Surgical History:  Past Surgical History  Procedure Laterality Date  . Thyroidectomy    . Cervical disc  surgery    . Eye surgery      CATARACTS  . Insertion of dialysis catheter Right 03/19/2012    Procedure: INSERTION OF DIALYSIS CATHETER;  Surgeon: Mal Misty, MD;  Location: [place];  Service: Vascular;  Laterality: Right;  Right Internal Jugular  . Av fistula placement Left 03/25/2012    Procedure: ARTERIOVENOUS (AV) FISTULA CREATION;  Surgeon: Mal Misty, MD;  Location: [place];  Service: Vascular;  Laterality: Left;  . Ligation of competing branches of arteriovenous fistula Left 05/08/2012    Procedure: LIGATION OF COMPETING BRANCHES OF ARTERIOVENOUS FISTULA;  Surgeon: Mal Misty, MD;  Location: [place];  Service: Vascular;  Laterality: Left;  Ultrasound guided  . Cardiac catheterization      approx 30 years ago  . Amputation Right 11/10/2012    Procedure: AMPUTATION FIRST and SECOND TOES Right Foot;  Surgeon: Elam Dutch, MD;  Location: [facility] OR;  Service: Vascular;  Laterality: Right;  . Transmetatarsal amputation Left 12/16/2012    Procedure: TRANSMETATARSAL AMPUTATION AND VAC PLACEMENT;  Surgeon: Elam Dutch, MD;  Location: [place];  Service: Vascular;  Laterality: Left;  . Amputation Left 04/07/2013    Procedure: AMPUTATION DIGIT- LEFT 1ST TOE;  Surgeon: Mal Misty, MD;  Location: [place];  Service: Vascular;  Laterality: Left;  . Tee without cardioversion N/A 04/20/2013    Procedure: TRANSESOPHAGEAL  ECHOCARDIOGRAM (TEE);  Surgeon: Josue Hector, MD;  Location: [facility] ENDOSCOPY;  Service: Cardiovascular;  Laterality: N/A;  . I&d extremity Left 04/22/2013    Procedure: HUDJSHFWYO VZC DEBRIDEMENT LEFT FIRST TOE AMPUTATION WOUND ;  Surgeon: Mal Misty, MD;  Location: [place];  Service: Vascular;  Laterality: Left;  . Amputation Left 04/26/2013    Procedure: Left Foot Transmetatarsal Amputation;  Surgeon: Newt Minion, MD;  Location: [place];  Service: Orthopedics;  Laterality: Left;  . Toe amputation      D/C 04-30-13  . Below knee leg amputation Left 05/14/2013    DR [name]  . Amputation  Left 05/14/2013    Procedure: AMPUTATION BELOW KNEE;  Surgeon: Newt Minion, MD;  Location: [place];  Service: Orthopedics;  Laterality: Left;  Left Below Knee Amputation    Assessment & Plan Clinical Impression:  Jack [name] is a 68 y.o. male with h/o of MM, ESRD-HD, DM type 2 with insensate neuropathy, recent MRSA bacteremia and poorly healing L-transmet amputation wound with progressive gangrenous changes. Was discharged to [facility] for therapy and antibiotics. He continued to have poor wound healing and was admitted on 05/14/13 for L-BKA by Dr. Sharol Given. Post op HD ongoing and transfused of acute on chronic anemia. To continue IV vancomycin through 05/28/13 for bacteremia from foot wound. Attempted to get OOB independently and sustained a fall 05/18/13.  Pt reported to OT that he just "forgot" that he couldn't walk. Patient transferred to CIR on 05/18/2013 .   Patient currently requires total 2 assist with mobility secondary to muscle weakness and muscle joint tightness, decreased cardiorespiratoy endurance and decreased oxygen support and decreased awareness, decreased problem solving, decreased safety awareness and decreased memory.  Prior to hospitalization, patient was modified independent  with mobility and lived with   in a [place] home.  Home access is  Level entry.  Patient will benefit from skilled PT intervention to maximize safe functional mobility, minimize fall risk and decrease caregiver burden for planned discharge home with 24 hour supervision.  Anticipate patient will benefit from follow up [place] at discharge.  PT - End of Session Activity Tolerance: Tolerates < 10 min activity, no significant change in vital signs Endurance Deficit: Yes PT Assessment Rehab Potential: Good PT Patient demonstrates impairments in the following area(s): Endurance;Motor;Safety;Sensory;Skin Integrity PT Transfers Functional Problem(s): Bed Mobility;Bed to Chair;Car;Furniture PT Locomotion Functional  Problem(s): Wheelchair Mobility PT Plan PT Intensity: Minimum of 1-2 x/day ,45 to 90 minutes PT Frequency: 5 out of 7 days PT Duration Estimated Length of Stay: 7-10 PT Treatment/Interventions: Balance/vestibular training;Cognitive remediation/compensation;Discharge planning;Community reintegration;DME/adaptive equipment instruction;Functional mobility training;Patient/family education;Pain management;Neuromuscular re-education;Splinting/orthotics;Therapeutic Exercise;Therapeutic Activities;UE/LE Strength taining/ROM;Wheelchair propulsion/positioning;UE/LE Coordination activities PT Transfers Anticipated Outcome(s): supervision overall PT Locomotion Anticipated Outcome(s): w/c propulsion x 150' modified independent contolled env, x 50' home environment; 150' supervision community env PT Recommendation Follow Up Recommendations: Home health PT Patient destination: Home Equipment Recommended: Wheelchair (measurements);Wheelchair cushion (measurements);To be determined Equipment Details: pt owns a manual w/c and cushion; armrests difficult to remove  Skilled Therapeutic Intervention Tx 1:  therapeutic activity- neuromuscular re-education via VCs, tactile cues for typical movement patterns for bed mobility,  and appropriate wt shifting for squat pivot transfers.   Tx 2:  Adjusted a CIR w/c for pt to use, as personal w/c armrest cannot be removed for squat pivot transfer.  Consulted with RN regarding skin issues R foot with transmetatarsal amp: bruising posterior aspect, which should not be a problem with wt bearing on regular  legrest with flat foot plate.  Pt stated loaner w/c is more comfortable; upright posture was improved.    W/c propulsion using bil UEs to propel, with min assist, mod VCs for more efficient propulsion, x 100' with min Assist.  PT Evaluation Precautions/Restrictions Precautions Precautions: Fall Precaution Comments: Lt BKA NWB; fell 05/19/13 attempting to get back in bed  without assistance Required Braces or Orthoses: Other Brace/Splint Restrictions Weight Bearing Restrictions: Yes LLE Weight Bearing: Non weight bearing Other Position/Activity Restrictions: strict NWB LLE General   Vital SignsTherapy Vitals Temp: 98.2 F (36.8 C) Temp src: Oral Pulse Rate: 77 Resp: 17 BP: 104/63 mmHg Patient Position, if appropriate: Sitting Oxygen Therapy SpO2: 81% at rest before start of tx; 98 % (after pushing w/c) O2 Device: None (Room air) Pain Pain Assessment Pain Assessment: 0-10 Pain Score: 7  Pain Type: Surgical pain Pain Location: Leg Pain Orientation: Left Pain Onset: On-going Patients Stated Pain Goal: 4 Pain Intervention(s): Medication (See eMAR) Home Living/Prior Functioning Home Living Available Help at Discharge: Family;Available 24 hours/day Type of Home: Apartment Home Access: Level entry Home Layout: One level Additional Comments: Pt recently transferred from [facility]. Pt working with OT / PT at SNF Prior Function Level of Independence: Other (comment) (before amputation 04/14/13, pt was mod I iwth gait, with RW and  "special shoe") Vocation: Retired Leisure: Hobbies-no Comments: requires use of w/c since 4/13 amputation Vision/Perception - wears glasses all the time; has diplopia so lenses have prisms    Cognition Arousal/Alertness: Lethargic Orientation Level: Oriented X4 Pt reported "this is the first time I have ever tried to drive a w/c, despite telling OT earlier that he had been using one for months. Sensation Sensation Light Touch: Impaired Detail Light Touch Impaired Details: Absent RLE (absent distal foot; impaired lower leg in stocking pattern; appears intact L residual limb) Proprioception: Appears Intact (R ankle) Additional Comments: bil fingertips "numb" Motor  Motor Motor: Within Functional Limits Motor - Skilled Clinical Observations: Generalized muscle weakness  Mobility Bed Mobility Bed Mobility:  Rolling Left;Left Sidelying to Sit;Sit to Sidelying Left Rolling Left: 5: Supervision Left Sidelying to Sit: 3: Mod assist Sit to Sidelying Left: 5: Supervision Transfers Transfers: Yes Squat Pivot Transfers: 1: +2 Total assist Squat Pivot Transfer Details: Manual facilitation for weight shifting;Manual facilitation for weight bearing;Verbal cues for technique Squat Pivot Transfer Details (indicate cue type and reason): pt fearful of leaning forward; 2nd person needed to steady w/c Locomotion  Wheelchair Mobility Distance: 100  Trunk/Postural Assessment  Cervical Assessment Cervical Assessment: Within Functional Limits Thoracic Assessment Thoracic Assessment: Within Functional Limits Lumbar Assessment Lumbar Assessment: Within Functional Limits Postural Control Postural Control: Within Functional Limits  Balance Balance Balance Assessed: Yes Dynamic Sitting Balance Dynamic Sitting - Level of Assistance: 5: Stand by assistance Static Standing Balance Static Standing - Level of Assistance: Not tested (comment) (unable at this time) Extremity Assessment      RLE Assessment RLE Assessment: Exceptions to [facility] (PF/DF contractures R foot, 0-15 degrees PF range; 0 degrees DF range) RLE Strength RLE Overall Strength Comments: grossly in sitting: 4/5 hip flex/abd/add, knee flex/ext, ankle DF(limited active range) LLE Assessment LLE Assessment: Exceptions to [facility] (hamstrings tight; grossly 4/5 hip flex/abd/add; knee ext 1+/5 ) LLE PROM (degrees) Left Knee Extension: lacks 30 degrees knee ext in sitting  FIM:  FIM - Bed/Chair Transfer Bed/Chair Transfer: 3: Supine > Sit: Mod A (lifting assist/Pt. 50-74%/lift 2 legs;5: Sit > Supine: Supervision (verbal cues/safety issues);1: Two helpers (extra person to  hold w/c steady) FIM - Locomotion: Wheelchair Distance: 100 Locomotion: Wheelchair: 2: Travels 50 - 149 ft with minimal assistance (Pt.>75%) FIM - Locomotion: Ambulation Locomotion:  Ambulation: 0: Activity did not occur FIM - Locomotion: Stairs Locomotion: Stairs: 0: Activity did not occur   Refer to Care Plan for Long Term Goals  Recommendations for other services: None  Discharge Criteria: Patient will be discharged from PT if patient refuses treatment 3 consecutive times without medical reason, if treatment goals not met, if there is a change in medical status, if patient makes no progress towards goals or if patient is discharged from hospital.  The above assessment, treatment plan, treatment alternatives and goals were discussed and mutually agreed upon: by patient  Frederic Jericho 05/19/2013, 12:32 PM

## 2013-05-19 NOTE — Progress Notes (Signed)
INITIAL NUTRITION ASSESSMENT  DOCUMENTATION CODES Per approved criteria  -Severe malnutrition in the context of chronic illness   Pt meets criteria for severe MALNUTRITION in the context of chronic illness as evidenced by energy intake of  </=75% x 1 month and severe depletion of muscle mass.  INTERVENTION: Downgrade to Dysphagia 3 diet  Continue Nepro Shake PRN Continue renal multivitamin Encouraged po intake to prevent further weight loss Snacks TID  Prostat TID RD to continue to follow nutrition care plan  NUTRITION DIAGNOSIS: Malnutrition related to ESRD as evidenced by energy intake of </=75% x 1 month and severe depletion of muscle mass  Goal: Meet >/=90% of estimated nutrition needs   Monitor:  PO intake, diet texture, weight trend, labs  Reason for Assessment: Follow- Up  68 y.o. male  Admitting Dx: Osteomyelitis abscess ulceration left foot   ASSESSMENT: Patient is a 68 year old male with history of diabetes, hypertension, hypothyroidism, ESRD on HD MWF, MRSA bacteremia  Patient is s/p left foot trans metatarsal amputation on 4/13 for gangrene left foot. Discharged 4/17 and brought to ER on 4/18 by daughter because of low BP. Patient was not eating well at home at that time and reported weakness, vomiting and diarrhea. Patient refused rehabilitation at that time.  5/1- Patient is s/p left transtibial amputation 5/5- Transferred to Rehab  Pt is a poor historian. Reports that he consumed 90% of breakfast and EPIC chart documents 50%. Pt reports that he has trouble chewing because of his poor dentition.   Pt's current weight is 154 lbs post-dialysis, which is a weight loss of 4 lbs in 1.5 weeks. This includes the amputation which accounts for some of the weight loss. Do not have weight on admission prior to amputation.  EPIC charts shows an overall weight loss of 44 lbs (22%) since March 2015. Suspect some of this may be due to fluid, as pt has history of edema.    Performed repeat physical exam.   Nutrition Focused Physical Exam:  Subcutaneous Fat:  Orbital Region: wnl Upper Arm Region: mild depletion  Thoracic and Lumbar Region: moderate depletion   Muscle:  Temple Region: wnl Clavicle Bone Region: mild depletion Clavicle and Acromion Bone Region: wnl Scapular Bone Region: wnl Dorsal Hand: severe depletion  Patellar Region: severe depletion Anterior Thigh Region: severe depletion Posterior Calf Region: mild depletion   Edema: none    Height: Ht Readings from Last 1 Encounters:  05/18/13 '5\' 10"'  (1.778 m)    Weight: Wt Readings from Last 1 Encounters:  05/19/13 154 lb 5.2 oz (70 kg)    Ideal Body Weight: 70.9 kg ,adjuested for L BKA  % Ideal Body Weight: 95%   Wt Readings from Last 10 Encounters:  05/19/13 154 lb 5.2 oz (70 kg)  05/17/13 149 lb 4 oz (67.7 kg)  05/17/13 149 lb 4 oz (67.7 kg)  05/07/13 158 lb 15.2 oz (72.1 kg)  04/30/13 159 lb 6.3 oz (72.3 kg)  04/30/13 159 lb 6.3 oz (72.3 kg)  04/30/13 159 lb 6.3 oz (72.3 kg)  04/30/13 159 lb 6.3 oz (72.3 kg)  04/07/13 198 lb (89.812 kg)  04/07/13 198 lb (89.812 kg)    Usual Body Weight: 160 lbs   % Usual Body Weight: 96%   BMI:  Body mass index is 22.14 kg/(m^2).  Estimated Nutritional Needs: Kcal: 2000- 2200 Protein: 100 - 115 grams Fluid: >/=1.2 L/day  Skin: Pressure Ulcer R Heel  Stage II Pressure Ulcer Coccyx   Diet Order: Carb Control  Meal Completion: 50%   EDUCATION NEEDS: -Education needs addressed   Intake/Output Summary (Last 24 hours) at 05/19/13 1331 Last data filed at 05/19/13 0800  Gross per 24 hour  Intake    480 ml  Output      0 ml  Net    480 ml    Last BM: 5/5   Labs:   Recent Labs Lab 05/15/13 0929 05/16/13 0643 05/17/13 1202  NA 139 137 133*  K 3.6* 4.8 4.2  CL 95* 97 93*  CO2 '25 27 24  ' BUN 34* 17 26*  CREATININE 7.81* 4.66* 6.71*  CALCIUM 9.1 9.4 9.2  PHOS 5.1* 3.1 3.9  GLUCOSE 94 88 112*    CBG (last 3)    Recent Labs  05/18/13 1632 05/18/13 2038 05/19/13 0700  GLUCAP 108* 137* 106*    Scheduled Meds: . calcium acetate  667 mg Oral TID WC  . colchicine  0.3 mg Oral Daily  . [START ON 05/21/2013] darbepoetin (ARANESP) injection - DIALYSIS  60 mcg Intravenous Q Fri-HD  . enoxaparin (LOVENOX) injection  30 mg Subcutaneous Q24H  . gabapentin  100 mg Oral QHS  . heparin  20 Units/kg Dialysis Once in dialysis  . levothyroxine  175 mcg Oral QAC breakfast  . multivitamin  1 tablet Oral QHS  . oxyCODONE  10 mg Oral BID  . vancomycin  1,000 mg Intravenous Q M,W,F-HD    Continuous Infusions:   Past Medical History  Diagnosis Date  . Hypertension   . Pneumonia     2012  . Heart murmur   . Glaucoma   . Multiple myeloma, without mention of having achieved remission 03/30/2012    Cytogenetic neg on 03/23/2012.  . Asthma   . Hyperparathyroidism, secondary renal   . Peripheral arterial disease   . End-stage renal disease on hemodialysis     Started HD March 2014.  Cause of ESRD was DM.  Gets HD at [facility] on [place] on MWF schedule.   . Diabetes mellitus without complication     Type 1  . Thyroid disease     hyperparathyroidism  . MRSA bacteremia   . Anemia   . Peripheral vascular disease, unspecified 11/19/2012    In the past had R foot toe amps then R TMA. In 2015 had left foot toe amp > then TMA >then L BKA on 05/14/13   . History of MRSA infection 04/22/2013    Bacteremia assoc w L foot wound infection Mar 2015     Past Surgical History  Procedure Laterality Date  . Thyroidectomy    . Cervical disc surgery    . Eye surgery      CATARACTS  . Insertion of dialysis catheter Right 03/19/2012    Procedure: INSERTION OF DIALYSIS CATHETER;  Surgeon: Mal Misty, MD;  Location: [place];  Service: Vascular;  Laterality: Right;  Right Internal Jugular  . Av fistula placement Left 03/25/2012    Procedure: ARTERIOVENOUS (AV) FISTULA CREATION;  Surgeon: Mal Misty, MD;  Location: [place];  Service: Vascular;  Laterality: Left;  . Ligation of competing branches of arteriovenous fistula Left 05/08/2012    Procedure: LIGATION OF COMPETING BRANCHES OF ARTERIOVENOUS FISTULA;  Surgeon: Mal Misty, MD;  Location: [place];  Service: Vascular;  Laterality: Left;  Ultrasound guided  . Cardiac catheterization      approx 30 years ago  . Amputation Right 11/10/2012    Procedure: AMPUTATION FIRST and SECOND TOES Right Foot;  Surgeon: Elam Dutch, MD;  Location: [facility] OR;  Service: Vascular;  Laterality: Right;  . Transmetatarsal amputation Left 12/16/2012    Procedure: TRANSMETATARSAL AMPUTATION AND VAC PLACEMENT;  Surgeon: Elam Dutch, MD;  Location: [place];  Service: Vascular;  Laterality: Left;  . Amputation Left 04/07/2013    Procedure: AMPUTATION DIGIT- LEFT 1ST TOE;  Surgeon: Mal Misty, MD;  Location: [place];  Service: Vascular;  Laterality: Left;  . Tee without cardioversion N/A 04/20/2013    Procedure: TRANSESOPHAGEAL ECHOCARDIOGRAM (TEE);  Surgeon: Josue Hector, MD;  Location: [facility] ENDOSCOPY;  Service: Cardiovascular;  Laterality: N/A;  . I&d extremity Left 04/22/2013    Procedure: [id number] TNB DEBRIDEMENT LEFT FIRST TOE AMPUTATION WOUND ;  Surgeon: Mal Misty, MD;  Location: [place];  Service: Vascular;  Laterality: Left;  . Amputation Left 04/26/2013    Procedure: Left Foot Transmetatarsal Amputation;  Surgeon: Newt Minion, MD;  Location: [place];  Service: Orthopedics;  Laterality: Left;  . Toe amputation      D/C 04-30-13  . Below knee leg amputation Left 05/14/2013    DR [name]  . Amputation Left 05/14/2013    Procedure: AMPUTATION BELOW KNEE;  Surgeon: Newt Minion, MD;  Location: [place];  Service: Orthopedics;  Laterality: Left;  Left Below Knee Amputation    Carrolyn Leigh, BS Dietetic Intern Pager: [place], [place], [place] Pager [phone] After Hours Pager

## 2013-05-20 ENCOUNTER — Inpatient Hospital Stay (HOSPITAL_COMMUNITY): Payer: Medicare Other | Admitting: Physical Therapy

## 2013-05-20 ENCOUNTER — Inpatient Hospital Stay (HOSPITAL_COMMUNITY): Payer: Medicare Other

## 2013-05-20 ENCOUNTER — Inpatient Hospital Stay (HOSPITAL_COMMUNITY): Payer: Medicare Other | Admitting: *Deleted

## 2013-05-20 LAB — GLUCOSE, CAPILLARY
GLUCOSE-CAPILLARY: 96 mg/dL (ref 70–99)
GLUCOSE-CAPILLARY: 111 mg/dL — AB (ref 70–99)
Glucose-Capillary: 136 mg/dL — ABNORMAL HIGH (ref 70–99)
Glucose-Capillary: 145 mg/dL — ABNORMAL HIGH (ref 70–99)

## 2013-05-20 NOTE — Progress Notes (Signed)
Subjective:   Doing well  Objective Filed Vitals:   05/19/13 2130 05/19/13 2209 05/20/13 0519 05/20/13 0820  BP: 108/60 1_0  Pulse: 71 99 70 85  Temp: 98 F (36.7 C) 98.1 F (36.7 C) 97.9 F (36.6 C)   TempSrc: Oral Oral Oral   Resp: _1 Height:      Weight: 67.6 kg (149 lb 0.5 oz)  72.1 kg (158 lb 15.2 oz)   SpO2: 100% 99% 100%    Physical Exam Alert, no distress No jvd Chest clear bilat RRR no MRG Abd soft, NTND Ext no edema, old R TMA, L BKA wrapped LUA AVF patent  HD[place] [facility] [place]  4h 400/800 85.5kg dry wt Heparin 3500 LUA AVF  Assessment/Plan:  1. L transtibial amputation 5/1 by ortho.  dressing dry and intact . Vancomycin Q HD. 2. ESRD - MWF @ [place], HD today. K+ 4.2 3. Anemia - hgb 9.4  1 u PRBC 5/1. Cont Aranesp 60 q friday. Watch CBC 4. Secondary hyperparathyroidism - Ca+9.2 / corrected 10.9, use 2 Ca+ bath. Change phoslo to fosrenol 5. HTN/volume - 104/63 hypotension, meds stopped.  6. Nutrition - alb 2.1. dys diet. multivit.Poor appetite. nepro 7. Multiple myeloma- next onc follow up in June  8. Dispo- Now in rehab 9. Severe PVD  [name] MD pager [phone]    cell [phone] 05/20/2013, 12:09 PM   Additional Objective Labs: Basic Metabolic Panel:  Recent Labs Lab 05/15/13 0929 05/16/13 0643 05/17/13 1202  NA 139 137 133*  K 3.6* 4.8 4.2  CL 95* 97 93*  CO2 _2 GLUCOSE 94 88 112*  BUN 34* 17 26*  CREATININE 7.81* 4.66* 6.71*  CALCIUM 9.1 9.4 9.2  PHOS 5.1* 3.1 3.9   Liver Function Tests:  Recent Labs Lab 05/14/13 1010 05/15/13 0929 05/16/13 0643 05/17/13 1202  AST 12  --   --   --   ALT 9  --   --   --   ALKPHOS 87  --   --   --   BILITOT 0.7  --   --   --   PROT 7.6  --   --   --   ALBUMIN 2.3* 2.1* 2.2* 2.1*   No results found for this basename: LIPASE, AMYLASE,  in the last 168 hours CBC:  Recent Labs Lab 05/14/13 1010 05/15/13 0531 05/16/13 0643 05/17/13 0633  WBC 11.5* 9.7 10.6* 9.4   NEUTROABS  --  7.4 8.1* 6.8  HGB 7.7* 8.6* 9.3* 9.4*  HCT 25.3* 28.0* 30.1* 31.1*  MCV 86.1 85.6 86.2 86.9  PLT 303 318 307 305   Blood Culture    Component Value Date/Time   SDES BLOOD RIGHT HAND 05/01/2013 2100   SPECREQUEST BOTTLES DRAWN AEROBIC ONLY [facility] 05/01/2013 2100   CULT  Value: NO GROWTH 5 DAYS Performed at [facility] 05/01/2013 2100   REPTSTATUS 05/08/2013 FINAL 05/01/2013 2100    Cardiac Enzymes: No results found for this basename: CKTOTAL, CKMB, CKMBINDEX, TROPONINI,  in the last 168 hours CBG:  Recent Labs Lab 05/19/13 1122 05/19/13 1656 05/19/13 2155 05/20/13 0712 05/20/13 1151  GLUCAP 135* 121* 96 96 111*   Iron Studies: No results found for this basename: IRON, TIBC, TRANSFERRIN, FERRITIN,  in the last 72 hours _3 @ Studies/Results: No results found. Medications:   . colchicine  0.3 mg Oral Daily  . [START ON 05/21/2013] darbepoetin (ARANESP) injection - DIALYSIS  60 mcg Intravenous Q  Fri-HD  . enoxaparin (LOVENOX) injection  30 mg Subcutaneous Q24H  . feeding supplement (PRO-STAT SUGAR FREE 64)  30 mL Oral TID WC  . gabapentin  100 mg Oral QHS  . heparin  20 Units/kg Dialysis Once in dialysis  . lanthanum  1,000 mg Oral TID WC  . levothyroxine  175 mcg Oral QAC breakfast  . multivitamin  1 tablet Oral QHS  . oxyCODONE  10 mg Oral BID  . vancomycin  1,000 mg Intravenous Q M,W,F-HD

## 2013-05-20 NOTE — IPOC Note (Addendum)
Overall Plan of Care [facility]) Patient Details Name: Jack [name] MRN: [id number] DOB: Oct 24, 1945  Admitting Diagnosis: L BKA  Hospital Problems: Active Problems:   BKA stump complication     Functional Problem List: Nursing Bowel;Safety;Sensory;Skin Integrity;Endurance;Medication Management;Nutrition;Pain;Motor  PT Endurance;Motor;Safety;Sensory;Skin Integrity  OT Endurance;Pain;Cognition;Motor;Skin Integrity  SLP    TR Endurance;Pain;Safety       Basic ADL's: OT Bathing;Dressing;Toileting     Advanced  ADL's: OT       Transfers: PT Bed Mobility;Bed to Chair;Car;Furniture  OT Toilet     Locomotion: PT Wheelchair Mobility     Additional Impairments: OT None  SLP        TR      Anticipated Outcomes Item Anticipated Outcome  Self Feeding I  Swallowing      Basic self-care  supervision  Toileting  supervision with drop arm BSC   Bathroom Transfers supervision with drop arm BSC  Bowel/Bladder  Anuric; HD,  manage bowel with mod I assist  Transfers  supervision overall  Locomotion  w/c propulsion x 150' modified independent contolled env, x 50' home environment; 150' supervision community env  Communication     Cognition     Pain  Pain at or below level 6 with prn medication and scheduled neuropathic pain meds  Safety/Judgment  Maintain safety with min assist   Therapy Plan: PT Intensity: Minimum of 1-2 x/day ,45 to 90 minutes PT Frequency: 5 out of 7 days PT Duration Estimated Length of Stay: 7-10 OT Intensity: Minimum of 1-2 x/day, 45 to 90 minutes OT Frequency: 5 out of 7 days OT Duration/Estimated Length of Stay: 7 days         Team Interventions: Nursing Interventions    PT interventions Balance/vestibular training;Cognitive remediation/compensation;Discharge planning;Community reintegration;DME/adaptive equipment instruction;Functional mobility training;Patient/family education;Pain management;Neuromuscular  re-education;Splinting/orthotics;Therapeutic Exercise;Therapeutic Activities;UE/LE Strength taining/ROM;Wheelchair propulsion/positioning;UE/LE Coordination activities  OT Interventions Cognitive remediation/compensation;Discharge planning;DME/adaptive equipment instruction;Functional mobility training;Pain management;Patient/family education;Psychosocial support;Self Care/advanced ADL retraining;Skin care/wound managment;Therapeutic Activities;Therapeutic Exercise;UE/LE Strength taining/ROM;Wheelchair propulsion/positioning  SLP Interventions    TR Interventions  recreation/leisure participation, therapeutic activities, adaptive equipment use, functional mobility, balance training, UE/LE strength/coordination, pain management, community reintegration, pt/family education, discharge planning, psychosocial support  SW/CM Interventions Discharge Planning;Psychosocial Support;Patient/Family Education    Team Discharge Planning: Destination: PT-Home ,OT- Home , SLP-  Projected Follow-up: PT-Home health PT, OT-  Home health OT, SLP-  Projected Equipment Needs: PT-Wheelchair (measurements);Wheelchair cushion (measurements);To be determined, OT- 3 in 1 bedside comode;Other (comment) (wide drop arm), SLP-  Equipment Details: PT-pt owns a manual w/c and cushion; armrests difficult to remove, OT-  Patient/family involved in discharge planning: PT- Patient,  OT-Patient, SLP-   MD ELOS: 8 days Medical Rehab Prognosis:  Excellent Assessment: The patient has been admitted for CIR therapies with the diagnosis of BKA. The team will be addressing functional mobility, strength, stamina, balance, safety, adaptive techniques and equipment, self-care, bowel and bladder mgt, patient and caregiver education, pre=prosthetic ed, pain mgt, wound care, ego-support, w/c mobility. Goals have been set at supervision level with mobility and self-care at a w/c level.    Meredith Staggers, MD, FAAPMR       See Team  Conference Notes for weekly updates to the plan of care

## 2013-05-20 NOTE — Progress Notes (Signed)
Social Work Assessment and Plan  Patient Details  Name: Jack [name] MRN: [id number] Date of Birth: Jul 24, 1945  Today's Date: 05/20/2013  Problem List:  Patient Active Problem List   Diagnosis Date Noted  . BKA stump complication 01/22/3233  . Asthma 05/17/2013  . S/P BKA left leg  05/17/2013  . FTT (failure to thrive) in adult 05/01/2013  . History of MRSA infection 04/22/2013  . Peripheral vascular disease, unspecified 11/19/2012  . End-stage renal disease on hemodialysis 05/05/2012  . Anemia in chronic kidney disease 03/19/2012  . Iron deficiency 03/19/2012  . Hypothyroidism 03/19/2012  . Hypertension 03/18/2012  . MGUS (monoclonal gammopathy of unknown significance) 02/28/2011   Past Medical History:  Past Medical History  Diagnosis Date  . Hypertension   . Pneumonia     2012  . Heart murmur   . Glaucoma   . Multiple myeloma, without mention of having achieved remission 03/30/2012    Cytogenetic neg on 03/23/2012.  . Asthma   . Hyperparathyroidism, secondary renal   . Peripheral arterial disease   . End-stage renal disease on hemodialysis     Started HD March 2014.  Cause of ESRD was DM.  Gets HD at [facility] on [address] on MWF schedule.   . Diabetes mellitus without complication     Type 1  . Thyroid disease     hyperparathyroidism  . MRSA bacteremia   . Anemia   . Peripheral vascular disease, unspecified 11/19/2012    In the past had R foot toe amps then R TMA. In 2015 had left foot toe amp > then TMA >then L BKA on 05/14/13   . History of MRSA infection 04/22/2013    Bacteremia assoc w L foot wound infection Mar 2015    Past Surgical History:  Past Surgical History  Procedure Laterality Date  . Thyroidectomy    . Cervical disc surgery    . Eye surgery      CATARACTS  . Insertion of dialysis catheter Right 03/19/2012    Procedure: INSERTION OF DIALYSIS CATHETER;  Surgeon: Mal Misty, MD;  Location: [place];  Service: Vascular;  Laterality: Right;  Right  Internal Jugular  . Av fistula placement Left 03/25/2012    Procedure: ARTERIOVENOUS (AV) FISTULA CREATION;  Surgeon: Mal Misty, MD;  Location: [place];  Service: Vascular;  Laterality: Left;  . Ligation of competing branches of arteriovenous fistula Left 05/08/2012    Procedure: LIGATION OF COMPETING BRANCHES OF ARTERIOVENOUS FISTULA;  Surgeon: Mal Misty, MD;  Location: [place];  Service: Vascular;  Laterality: Left;  Ultrasound guided  . Cardiac catheterization      approx 30 years ago  . Amputation Right 11/10/2012    Procedure: AMPUTATION FIRST and SECOND TOES Right Foot;  Surgeon: Elam Dutch, MD;  Location: [facility] OR;  Service: Vascular;  Laterality: Right;  . Transmetatarsal amputation Left 12/16/2012    Procedure: TRANSMETATARSAL AMPUTATION AND VAC PLACEMENT;  Surgeon: Elam Dutch, MD;  Location: [place];  Service: Vascular;  Laterality: Left;  . Amputation Left 04/07/2013    Procedure: AMPUTATION DIGIT- LEFT 1ST TOE;  Surgeon: Mal Misty, MD;  Location: [place];  Service: Vascular;  Laterality: Left;  . Tee without cardioversion N/A 04/20/2013    Procedure: TRANSESOPHAGEAL ECHOCARDIOGRAM (TEE);  Surgeon: Josue Hector, MD;  Location: [place];  Service: Cardiovascular;  Laterality: N/A;  . I&d extremity Left 04/22/2013    Procedure: IRRIGATION AND DEBRIDEMENT LEFT FIRST TOE AMPUTATION WOUND ;  Surgeon: Mal Misty, MD;  Location: [place];  Service: Vascular;  Laterality: Left;  . Amputation Left 04/26/2013    Procedure: Left Foot Transmetatarsal Amputation;  Surgeon: Newt Minion, MD;  Location: [place];  Service: Orthopedics;  Laterality: Left;  . Toe amputation      D/C 04-30-13  . Below knee leg amputation Left 05/14/2013    DR [name]  . Amputation Left 05/14/2013    Procedure: AMPUTATION BELOW KNEE;  Surgeon: Newt Minion, MD;  Location: [place];  Service: Orthopedics;  Laterality: Left;  Left Below Knee Amputation   Social History:  reports that he quit smoking about 31 years  ago. His smoking use included Cigarettes. He smoked 0.00 packs per day. He has never used smokeless tobacco. He reports that he does not drink alcohol or use illicit drugs.  Family / Support Systems Marital Status: Single Patient Roles: Parent Children: Jack [name] - dtr [phone]; Jack [name] - dtr [phone]; Jack [name] - dtr [phone] Anticipated Caregiver: Dtrs and brother Ability/Limitations of Caregiver: Jack [name] works.  Jack [name] is going to school.  Brother is staying with patient to assist. Caregiver Availability: 24/7 Family Dynamics: tight knit family  Social History Preferred language: English Religion: Baptist Education: graduate school - music Read: Yes Write: Yes Employment Status: Retired Public relations account executive Issues: None reported Guardian/Conservator: N/A   Abuse/Neglect Physical Abuse: Denies Verbal Abuse: Denies Sexual Abuse: Denies Exploitation of patient/patient's resources: Denies Self-Neglect: Denies  Emotional Status Pt's affect, behavior and adjustment status: Pt has a good, positive outlook on his recovery.  He uses his sense of humor to get through these situations. Recent Psychosocial Issues: None reported Pyschiatric History: None reported Substance Abuse History: None reported  Patient / Family Perceptions, Expectations & Goals Pt/Family understanding of illness & functional limitations: Pt understands condition and limitations.  CSW to follow up with family. Premorbid pt/family roles/activities: Pt enjoys music and being with his family. Anticipated changes in roles/activities/participation: Pt will have assistance at home from his brother and dtrs. Pt/family expectations/goals: Pt wants to "get Jack [name] here."  [employer]: None Premorbid Home Care/DME Agencies: Other (Comment) (Pt was at [facility] for a short time post previous hospitalization.  Tx to hospital from  SNF.) Transportation available at discharge: family Resource referrals recommended: Support group (specify)  Discharge Planning Living Arrangements: Children;Other relatives (brother) Support Systems: Children;Other relatives (brother) Type of Residence: Private residence [employer] Resources: Multimedia programmer (specify) (AARP Medicare Complete) Financial Resources: Radio broadcast assistant Screen Referred: No Living Expenses: Rent Money Management: Family;Patient Does the patient have any problems obtaining your medications?: No Home Management: Pt's family to assist him. Patient/Family Preliminary Plans: Pt to return to home with supervision provided by brother and dtrs when they are not at work. Social Work Anticipated Follow Up Needs: HH/OP;Support Group Expected length of stay: 7-10 days  Clinical Impression CSW met with pt to introduce self and role of CSW, as well as complete assessment.  Pt was funny and pleasant during CSW visit.  Pt plans to return home with supervision from his brother and dtrs.  He was at [facility] for a short time, but does not plan to return there.  CSW explained that we would assist with any follow up therapies and DME pt will need at home.  Pt was pleased with this.  Dtrs visit often, so CSW will follow up with them to confirm d/c plan.  No questions/concerns/needs noted at this time.  CSW will continue to  follow and assist as needed.  Silvestre Mesi Katina Remick 05/20/2013, 2:29 PM

## 2013-05-20 NOTE — Progress Notes (Signed)
NUTRITION FOLLOW UP  Intervention:   Continue Dysphagia 3 diet  Continue Nepro Shake PRN  Continue renal multivitamin  Encouraged po intake to prevent further weight loss  Continue Snacks TID  Continue Prostat TID  RD to continue to follow nutrition care plan  Nutrition Dx:   Malnutrition related to ESRD as evidenced by energy intake of </=75% x 1 month and severe depletion of muscle mass; ongoing  Goal:   Meet >/=90% of estimated nutrition needs   Monitor:   PO intake, supplement acceptance, weight trend, labs   Assessment:   Patient is a 68 year old male with history of diabetes, hypertension, hypothyroidism, ESRD on HD MWF, MRSA bacteremia   4/13- Patient is s/p left foot trans metatarsal amputation for gangrene left foot.  5/1- Patient is s/p left transtibial amputation  5/5- Transferred to Rehab  Pt stated he did not eat well at breakfast today. EPIC chart indicated he ate 10% of his breakfast.  Pt stated that he is having a bad day.   Pt stated that he received his meals/snacks yesterday but looked at them and did not eat them. Pt did state that he took his Prostat.  Pt was to receive snack at 10 am today, but did not receive it. Dietetic intern called to dietary office to confirm snacks, and dietary is going to send 3 snacks today.   Pt set a goal to eat at least 1 snack today and take his Prostat. Encouraged pt to eat for his health and not for the taste or desire. Will follow up tomorrow.  Height: Ht Readings from Last 1 Encounters:  05/18/13 5\' 10"  (1.778 m)    Weight Status:   Wt Readings from Last 1 Encounters:  05/20/13 158 lb 15.2 oz (72.1 kg)   Re-estimated needs:  Kcal: 2000 - 2200 Protein: 100 - 115 grams  Fluid: >/= 1.2 L/day   Skin: Pressure Ulcer R Heel  Stage II Pressure Ulcer Coccyx    Diet Order: Dysphagia 3    Intake/Output Summary (Last 24 hours) at 05/20/13 1149 Last data filed at 05/20/13 0900  Gross per 24 hour  Intake    220 ml   Output      0 ml  Net    220 ml    Last BM: 5/5    Labs:   Recent Labs Lab 05/15/13 0929 05/16/13 0643 05/17/13 1202  NA 139 137 133*  K 3.6* 4.8 4.2  CL 95* 97 93*  CO2 25 27 24   BUN 34* 17 26*  CREATININE 7.81* 4.66* 6.71*  CALCIUM 9.1 9.4 9.2  PHOS 5.1* 3.1 3.9  GLUCOSE 94 88 112*    CBG (last 3)   Recent Labs  05/19/13 1656 05/19/13 2155 05/20/13 0712  GLUCAP 121* 96 96    Scheduled Meds: . colchicine  0.3 mg Oral Daily  . [START ON 05/21/2013] darbepoetin (ARANESP) injection - DIALYSIS  60 mcg Intravenous Q Fri-HD  . enoxaparin (LOVENOX) injection  30 mg Subcutaneous Q24H  . feeding supplement (PRO-STAT SUGAR FREE 64)  30 mL Oral TID WC  . gabapentin  100 mg Oral QHS  . heparin  20 Units/kg Dialysis Once in dialysis  . lanthanum  1,000 mg Oral TID WC  . levothyroxine  175 mcg Oral QAC breakfast  . multivitamin  1 tablet Oral QHS  . oxyCODONE  10 mg Oral BID  . vancomycin  1,000 mg Intravenous Q M,W,F-HD    Continuous Infusions:   [place],  BS Dietetic Intern Pager: [phone]  Intern note/chart reviewed. Revisions made.  [place], [place], [place] Pager [phone] After Hours Pager

## 2013-05-20 NOTE — Progress Notes (Signed)
Social Work Patient ID: Jack [name], male   DOB: Sep 24, 1945, 68 y.o.   MRN: [id number]  CSW spoke with Janett Billow at [facility] to find out about w/c chair in his room.  They explained this is his chair that he came to hospital in and that it belongs to the SNF.  They will have someone come to pick up the w/c.  CSW explained this to pt and told him that we would order one that would be specific to his needs/size closer to pt's d/c.

## 2013-05-20 NOTE — Progress Notes (Signed)
Nursing Note: Pt returned from HD and tolerated well per report.Pt ate 6 bites of dinner and was done despite encouragement.T-98.1 P-99 R-19 BP- 117/55 pO2 99% on r/a.Call bell in reach and bed alarm set.Pt educated to call for assistance if needed.

## 2013-05-20 NOTE — Progress Notes (Signed)
Physical Therapy Note  Patient Details  Name: Jack [name] MRN: [id number] Date of Birth: 01/10/1946 Today's Date: 05/20/2013  Time: 646-803 21 minutes  1:1 Pt c/o residual limb pain, RN made aware.  Sliding board transfers with min A initially, progressed to supervision, cuing for set up and technique, increased time due to delayed processing.  Pt educated in placing sliding board and w/c set up, continues to require cues.  Standing in parallel bars 3x with mod A with pt pulling up, requires assist to extend R knee, unable to achieve full upright posture with assist. Pt able to tolerate 3 x 20 seconds standing.  W/c mobility throughout unit with supervision, increased time due to w/c pulling to L, discussed with pt who provided w/c, pt unsure.  Supine therex for B LE strengthening with increased cuing. Sidelying B sides for continued hip strengthening, pt requires frequent rests but is motivated to improve.  Time: 1130-1155 25 minutes  1:1 no c/o pain, pt in bed but agreeable to therapy in bed.  Pt performed abdominal exercises for upper and lower abdominals and obliques, requires increased rest breaks but able to complete 2 x 10 each exercise.   Danae Orleans Harmani Neto 05/20/2013, 8:22 AM

## 2013-05-20 NOTE — Progress Notes (Signed)
Occupational Therapy Session Note  Patient Details  Name: Jack [name] MRN: [id number] Date of Birth: 08/31/1945  Today's Date: 05/20/2013 Time: 0930-1030 Time Calculation (min): 60 min  Short Term Goals: Week 1:     Skilled Therapeutic Interventions/Progress Updates: ADL-retraining with focus on slide board transfer skills, adapted bathing/dressing skills, dynamic sitting balance, sit><stand, endurance, and improved awareness.   Patient received supine in bed, alert and oriented to person and place, disoriented to situation.   After re-education on role of OT patient completed assisted bed mobility and performed slide board transfer from bed to w/c with mod assist.   Pt bathed a sink in w/c and attempted sit>stand but was unable to fully extend right hip or knee sufficiently or without pain to allow him to wash buttocks or peri-area.   Patient fatigued after performing upper body bathing and sit>stand resulting in need for rest again.  Patient returned to bed with max assist slide board transfer and mod assist for bed mobility.   Patient left in bed with call light and phone within reach.     Therapy Documentation Precautions:  Precautions Precautions: Fall Precaution Comments: Lt BKA NWB; fell 05/19/13 attempting to get back in bed without assistance Required Braces or Orthoses: Other Brace/Splint Restrictions Weight Bearing Restrictions: Yes LLE Weight Bearing: Non weight bearing Other Position/Activity Restrictions: strict NWB LLE  Pain: Pain Assessment Pain Assessment: 0-10 Pain Score: 6  Pain Type: Phantom pain Pain Location: Leg Pain Orientation: Left;Distal Pain Frequency: Intermittent Pain Onset: On-going Patients Stated Pain Goal: 0 Pain Intervention(s): Medication (See eMAR);Distraction;Elevated extremity  ADL: ADL ADL Comments: Refer to FIM  See FIM for current functional status  Therapy/Group: Individual Therapy  Second session: Time: 8938-1017 Time  Calculation (min): 45 min  Pain Assessment: No/denies pain  Skilled Therapeutic Interventions: ADL-retraining with focus on endurance and pain management at right lower leg. Patient was initially receptive to continue transfer training but became distracted by sudden onset of right leg pain while sitting at edge of bed.  Patient completed slide board transfer from bed to w/c and back 1 time before reporting need for rest, supine due SOB.  While in bed, patient recounted personal stories no related to treatment and became disinterested in resuming therapy session despite redirection provided.   Patient left in bed at end of session; call light and phone placed within reach.   See FIM for current functional status  Therapy/Group: Individual Therapy  Salome Spotted 05/21/2013, 12:34 PM

## 2013-05-20 NOTE — Progress Notes (Signed)
Dan PA was consulted on regard pt's low BP 88/58,p:8 and pain medicine.Orders were received to give pain medicine.Keep monitoring pt. Closely and assessing his needs.

## 2013-05-20 NOTE — Progress Notes (Signed)
[facility] PHYSICAL MEDICINE & REHABILITATION     PROGRESS NOTE    Subjective/Complaints: No new issues up in therapy already.   A 12 point review of systems has been performed and if not noted above is otherwise negative.   Objective: Vital Signs: Blood pressure 90/49, pulse 70, temperature 97.9 F (36.6 C), temperature source Oral, resp. rate 18, height 5\' 10"  (1.778 m), weight 72.1 kg (158 lb 15.2 oz), SpO2 100.00%. No results found. No results found for this basename: WBC, HGB, HCT, PLT,  in the last 72 hours  Recent Labs  05/17/13 1202  NA 133*  K 4.2  CL 93*  GLUCOSE 112*  BUN 26*  CREATININE 6.71*  CALCIUM 9.2   CBG (last 3)   Recent Labs  05/19/13 1656 05/19/13 2155 05/20/13 0712  GLUCAP 121* 96 96    Wt Readings from Last 3 Encounters:  05/20/13 72.1 kg (158 lb 15.2 oz)  05/17/13 67.7 kg (149 lb 4 oz)  05/17/13 67.7 kg (149 lb 4 oz)    Physical Exam:  Constitutional: He is oriented to person, place, and time.  Thin male-  HENT: oral mucosa pink/moist  Head: Normocephalic and atraumatic.  Eyes: Conjunctivae are normal.  Neck: Normal range of motion. Neck supple.  Cardiovascular: Normal rate and regular rhythm. No murmurs  Respiratory: Effort normal and breath sounds normal. No wheezes rales or rhonchi  GI: Soft. Bowel sounds are normal.  Musculoskeletal: He exhibits tenderness (left BKA site. )--tolerated dressing change.  Well healed right transmet site. Left hand with thenar atrophy and mild contracture. Left BKA with compressive dressing and tends to keep flexed.  Neurological: He is oriented to person, place, and time.  Appropriate affect. remains a little confused. Follows basic commands without difficulty. Improving insight. Stocking-glove peripheral neuropathy. RLE weakness noted.  Skin: Skin is warm and dry. Wound clean and intact with minimal drainage. Psychiatric: His affect is blunt. He is withdrawn.    Assessment/Plan: 1. Functional  deficits secondary to left BKA which require 3+ hours per day of interdisciplinary therapy in a comprehensive inpatient rehab setting. Physiatrist is providing close team supervision and 24 hour management of active medical problems listed below. Physiatrist and rehab team continue to assess barriers to discharge/monitor patient progress toward functional and medical goals. FIM: FIM - Bathing Bathing Steps Patient Completed: Chest;Right Arm;Left Arm;Abdomen;Front perineal area;Buttocks;Right upper leg;Left upper leg (9/9 parts) Bathing: 4: Min-Patient completes 8-9 27f 10 parts or 75+ percent  FIM - Upper Body Dressing/Undressing Upper body dressing/undressing steps patient completed: Thread/unthread right sleeve of pullover shirt/dresss;Thread/unthread left sleeve of pullover shirt/dress;Put head through opening of pull over shirt/dress;Pull shirt over trunk Upper body dressing/undressing: 5: Set-up assist to: Obtain clothing/put away FIM - Lower Body Dressing/Undressing Lower body dressing/undressing steps patient completed: Pull underwear up/down Lower body dressing/undressing: 2: Max-Patient completed 25-49% of tasks  FIM - Toileting Toileting steps completed by patient: Performs perineal hygiene Toileting: 2: Max-Patient completed 1 of 3 steps  FIM - Radio producer Devices: Recruitment consultant Transfers: 2-From toilet/BSC: Max A (lift and lower assist);2-To toilet/BSC: Max A (lift and lower assist);1-Two helpers  FIM - Control and instrumentation engineer Devices: Sliding board;Arm rests Bed/Chair Transfer: 3: Supine > Sit: Mod A (lifting assist/Pt. 50-74%/lift 2 legs;5: Sit > Supine: Supervision (verbal cues/safety issues);1: Two helpers (extra person to hold w/c steady)  FIM - Locomotion: Wheelchair Distance: 100 Locomotion: Wheelchair: 2: Travels 50 - 149 ft with moderate assistance (Pt: 50 -  74%) FIM - Locomotion:  Ambulation Locomotion: Ambulation: 0: Activity did not occur  Comprehension Comprehension Mode: Auditory Comprehension: 5-Understands complex 90% of the time/Cues < 10% of the time  Expression Expression Mode: Verbal Expression: 5-Expresses complex 90% of the time/cues < 10% of the time  Social Interaction Social Interaction: 6-Interacts appropriately with others with medication or extra time (anti-anxiety, antidepressant).  Problem Solving Problem Solving: 3-Solves basic 50 - 74% of the time/requires cueing 25 - 49% of the time  Memory Memory: 4-Recognizes or recalls 75 - 89% of the time/requires cueing 10 - 24% of the time  Medical Problem List and Plan:  1. Functional deficits secondary to L-BKA  2. DVT Prophylaxis/Anticoagulation: Pharmaceutical: Lovenox  3. Pain Management: Reports pain with mobility and with dependency of LLE. Doing well so far today. continue neurontin 100mg /hs to help with neuropathic pain---watch closely for mentation.  4. Mood: continue egosupport. Reasonable spirits at present  5. Neuropsych: This patient is capable of making decisions on his own behalf.  6. ESRD: Continue HD on Mon, Wed, Fri after therapy day is completed 7. Hypotension: BP medications discontinued. Continue to monitor BP every 8 hours.   -encourage better po intake 8. Anemia of chronic disease: Continue aranesp. Routine check of H/H with HD to monitor for stability/trends.  9. MRSA bacteremia: IV vancomycin initiated 04/17/13--8 week duration  LOS (Days) 2 A FACE TO FACE EVALUATION WAS PERFORMED  Meredith Staggers 05/20/2013 8:01 AM

## 2013-05-20 NOTE — Progress Notes (Signed)
[facility] Statement of Services  Patient Name:  Jack [name]  Date:  05/20/2013  Welcome to the [facility].  Our goal is to provide you with an individualized program based on your diagnosis and situation, designed to meet your specific needs.  With this comprehensive rehabilitation program, you will be expected to participate in at least 3 hours of rehabilitation therapies Monday-Friday, with modified therapy programming on the weekends.  Your rehabilitation program will include the following services:  Physical Therapy (PT), Occupational Therapy (OT), 24 hour per day rehabilitation nursing, Therapeutic Recreation (TR), Neuropsychology, Case Management (Social Worker), Rehabilitation Medicine, Nutrition Services and Pharmacy Services  Weekly team conferences will be held on Tuesdays to discuss your progress.  Your Social Worker will talk with you frequently to get your input and to update you on team discussions.  Team conferences with you and your family in attendance may also be held.  Expected length of stay:  7 to 10 days Overall anticipated outcome: Supervision  Depending on your progress and recovery, your program may change. Your Social Worker will coordinate services and will keep you informed of any changes. Your Social Worker's name and contact numbers are listed  below.  The following services may also be recommended but are not provided by the [facility] will be made to provide these services after discharge if needed.  Arrangements include referral to agencies that provide these services.  Your insurance has been verified to be:  [employer] Your primary doctor is:  Dr. Pedro Earls  Pertinent information will be shared with your doctor and your insurance company.  Social  Worker:  Alfonse Alpers, LCSW  [phone] or ([phone]  Information discussed with and copy given to patient by: Silvestre Mesi Markevion Lattin, 05/20/2013, 2:12 PM

## 2013-05-20 NOTE — Progress Notes (Signed)
Patient information reviewed and entered into eRehab system by Konica Stankowski, RN, CRRN, PPS Coordinator.  Information including medical coding and functional independence measure will be reviewed and updated through discharge.     Per nursing patient was given "Data Collection Information Summary for Patients in Inpatient Rehabilitation Facilities with attached "Privacy Act Statement-Health Care Records" upon admission.  

## 2013-05-21 ENCOUNTER — Inpatient Hospital Stay (HOSPITAL_COMMUNITY): Payer: Medicare Other

## 2013-05-21 ENCOUNTER — Inpatient Hospital Stay (HOSPITAL_COMMUNITY): Payer: Medicare Other | Admitting: Physical Therapy

## 2013-05-21 LAB — CBC
HCT: 31.4 % — ABNORMAL LOW (ref 39.0–52.0)
HEMOGLOBIN: 9.3 g/dL — AB (ref 13.0–17.0)
MCH: 26 pg (ref 26.0–34.0)
MCHC: 29.6 g/dL — ABNORMAL LOW (ref 30.0–36.0)
MCV: 87.7 fL (ref 78.0–100.0)
PLATELETS: 316 10*3/uL (ref 150–400)
RBC: 3.58 MIL/uL — AB (ref 4.22–5.81)
RDW: 15.6 % — ABNORMAL HIGH (ref 11.5–15.5)
WBC: 8.7 10*3/uL (ref 4.0–10.5)

## 2013-05-21 LAB — RENAL FUNCTION PANEL
Albumin: 2.3 g/dL — ABNORMAL LOW (ref 3.5–5.2)
BUN: 30 mg/dL — ABNORMAL HIGH (ref 6–23)
CO2: 28 meq/L (ref 19–32)
Calcium: 9.7 mg/dL (ref 8.4–10.5)
Chloride: 94 mEq/L — ABNORMAL LOW (ref 96–112)
Creatinine, Ser: 5.6 mg/dL — ABNORMAL HIGH (ref 0.50–1.35)
GFR calc Af Amer: 11 mL/min — ABNORMAL LOW (ref >90)
GFR, EST NON AFRICAN AMERICAN: 9 mL/min — AB (ref >90)
GLUCOSE: 207 mg/dL — AB (ref 70–99)
POTASSIUM: 4 meq/L (ref 3.7–5.3)
Phosphorus: 2.9 mg/dL (ref 2.3–4.6)
SODIUM: 139 meq/L (ref 137–147)

## 2013-05-21 LAB — GLUCOSE, CAPILLARY
GLUCOSE-CAPILLARY: 116 mg/dL — AB (ref 70–99)
Glucose-Capillary: 146 mg/dL — ABNORMAL HIGH (ref 70–99)
Glucose-Capillary: 151 mg/dL — ABNORMAL HIGH (ref 70–99)

## 2013-05-21 LAB — VANCOMYCIN, RANDOM
VANCOMYCIN RM: 21.9 ug/mL
Vancomycin Rm: 21.6 ug/mL

## 2013-05-21 MED ORDER — DARBEPOETIN ALFA-POLYSORBATE 60 MCG/0.3ML IJ SOLN
INTRAMUSCULAR | Status: AC
  Filled 2013-05-21: qty 0.3

## 2013-05-21 NOTE — Progress Notes (Addendum)
[facility] PHYSICAL MEDICINE & REHABILITATION     PROGRESS NOTE    Subjective/Complaints: Stump is sore today. Otherwise doing well. Phantom limb pain minimal A 12 point review of systems has been performed and if not noted above is otherwise negative.   Objective: Vital Signs: Blood pressure 106/53, pulse 67, temperature 97.6 F (36.4 C), temperature source Oral, resp. rate 18, height 5\' 10"  (1.778 m), weight 69.8 kg (153 lb 14.1 oz), SpO2 100.00%. No results found. No results found for this basename: WBC, HGB, HCT, PLT,  in the last 72 hours No results found for this basename: NA, K, CL, CO, GLUCOSE, BUN, CREATININE, CALCIUM,  in the last 72 hours CBG (last 3)   Recent Labs  05/20/13 1151 05/20/13 1631 05/20/13 2042  GLUCAP 111* 136* 145*    Wt Readings from Last 3 Encounters:  05/21/13 69.8 kg (153 lb 14.1 oz)  05/17/13 67.7 kg (149 lb 4 oz)  05/17/13 67.7 kg (149 lb 4 oz)    Physical Exam:  Constitutional: He is oriented to person, place, and time.  Thin male-  HENT: oral mucosa pink/moist  Head: Normocephalic and atraumatic.  Eyes: Conjunctivae are normal.  Neck: Normal range of motion. Neck supple.  Cardiovascular: Normal rate and regular rhythm. No murmurs  Respiratory: Effort normal and breath sounds normal. No wheezes rales or rhonchi  GI: Soft. Bowel sounds are normal.  Musculoskeletal: He exhibits tenderness (left BKA site. )--tolerated dressing change.  Well healed right transmet site. Left hand with thenar atrophy and mild contracture. Left BKA with ACE. Still keeps it flexed quite a bit. Neurological: He is oriented to person, place, and time.  Appropriate affect.   Improving insight and awareness. Stocking-glove peripheral neuropathy. RLE weakness noted.  Skin: Skin is warm and dry. Wound clean and intact with minimal drainage. Psychiatric: His affect is blunt. He is withdrawn.    Assessment/Plan: 1. Functional deficits secondary to left BKA which  require 3+ hours per day of interdisciplinary therapy in a comprehensive inpatient rehab setting. Physiatrist is providing close team supervision and 24 hour management of active medical problems listed below. Physiatrist and rehab team continue to assess barriers to discharge/monitor patient progress toward functional and medical goals. FIM: FIM - Bathing Bathing Steps Patient Completed: Chest;Right Arm;Left Arm;Abdomen;Front perineal area;Buttocks;Right upper leg;Left upper leg (9/9 parts) Bathing: 4: Min-Patient completes 8-9 13f 10 parts or 75+ percent  FIM - Upper Body Dressing/Undressing Upper body dressing/undressing steps patient completed: Thread/unthread right sleeve of pullover shirt/dresss;Thread/unthread left sleeve of pullover shirt/dress;Put head through opening of pull over shirt/dress;Pull shirt over trunk Upper body dressing/undressing: 5: Set-up assist to: Obtain clothing/put away FIM - Lower Body Dressing/Undressing Lower body dressing/undressing steps patient completed: Pull underwear up/down Lower body dressing/undressing: 2: Max-Patient completed 25-49% of tasks  FIM - Toileting Toileting steps completed by patient: Performs perineal hygiene Toileting: 2: Max-Patient completed 1 of 3 steps  FIM - Radio producer Devices: Recruitment consultant Transfers: 2-From toilet/BSC: Max A (lift and lower assist);2-To toilet/BSC: Max A (lift and lower assist);1-Two helpers  FIM - Control and instrumentation engineer Devices: Sliding board;Arm rests Bed/Chair Transfer: 3: Supine > Sit: Mod A (lifting assist/Pt. 50-74%/lift 2 legs;5: Sit > Supine: Supervision (verbal cues/safety issues);1: Two helpers (extra person to hold w/c steady)  FIM - Locomotion: Wheelchair Distance: 100 Locomotion: Wheelchair: 2: Travels 50 - 149 ft with moderate assistance (Pt: 50 - 74%) FIM - Locomotion: Ambulation Locomotion: Ambulation: 0: Activity did not  occur  Comprehension Comprehension Mode: Auditory Comprehension: 5-Follows basic conversation/direction: With extra time/assistive device  Expression Expression Mode: Verbal Expression: 5-Expresses basic needs/ideas: With extra time/assistive device  Social Interaction Social Interaction: 6-Interacts appropriately with others with medication or extra time (anti-anxiety, antidepressant).  Problem Solving Problem Solving: 3-Solves basic 50 - 74% of the time/requires cueing 25 - 49% of the time  Memory Memory: 4-Recognizes or recalls 75 - 89% of the time/requires cueing 10 - 24% of the time  Medical Problem List and Plan:  1. Functional deficits secondary to L-BKA   -prosthetic ed by [facility] requested 2. DVT Prophylaxis/Anticoagulation: Pharmaceutical: Lovenox  3. Pain Management: Reports pain with mobility and with dependency of LLE. Doing well so far today. continue neurontin 100mg /hs to help with neuropathic pain---spoke with pt/daughter about using this low dose 4. Mood: continue egosupport. Reasonable spirits at present  5. Neuropsych: This patient is capable of making decisions on his own behalf.  6. ESRD: Continue HD on Mon, Wed, Fri after therapy day is completed 7. Hypotension: BP medications discontinued. Continue to monitor BP every 8 hours.   -encourage better po intake  -volume mgt per nephrology 8. Anemia of chronic disease: Continue aranesp. Routine check of H/H with HD to monitor for stability/trends.  9. MRSA bacteremia: IV vancomycin initiated 04/17/13--8 week duration  LOS (Days) 3 A FACE TO FACE EVALUATION WAS PERFORMED  Meredith Staggers 05/21/2013 7:57 AM

## 2013-05-21 NOTE — Progress Notes (Signed)
Occupational Therapy Session Note  Patient Details  Name: Jack [name] MRN: [id number] Date of Birth: 1945/11/23  Today's Date: 05/21/2013 Time: 1100-1200 Time Calculation (min): 60 min  Short Term Goals: Week 1:     Skilled Therapeutic Interventions/Progress Updates:  ADL-retraining with focus on supine bathing and grooming with HOB elevated.  Patient was received supine in bed complaining of fatigue but willing to participate in bed-level treatment.  Patient agreed to bathing/dressing in bed and completed bathing with moderate verbal cues to redirect and sustain attention to task and setup assist to provide basin and supplies.   Patient then requested assist with trimming excess facial hair using electric trimmer and initial setup with shaving.   After overgrown beard was removed, patient carefully shaved at bed level with min assist for thoroughness.  Patient left in bed at end of session as lab staff and dietary service arrived, preparing to provide setup for self-feeding.    Therapy Documentation Precautions:  Precautions Precautions: Fall Precaution Comments: Lt BKA NWB; fell 05/19/13 attempting to get back in bed without assistance Required Braces or Orthoses: Other Brace/Splint Restrictions Weight Bearing Restrictions: Yes LLE Weight Bearing: Non weight bearing Other Position/Activity Restrictions: strict NWB LLE  Pain: 6/10, LLE   ADL: ADL ADL Comments: Refer to FIM  See FIM for current functional status  Therapy/Group: Individual Therapy  Second session: Time: 1829-9371 Time Calculation (min):  45 min  Pain Assessment: 6/10 at LLE; RN aware  Skilled Therapeutic Interventions: ADL-retraining with focus on dressing at bed level (upper sitting, lower body supine), improved static sitting balance, endurance, and slide board transfers.  Patient received supine in bed alert and receptive for treatment.   Patient reported he had not eaten but was not aware of tray on  bedside cabinet.  Patient reported poor appetite but was encouraged to eat before dialysis treatment scheduled later.   Patient became mildly confused regarding details of dialysis and related support systems at [facility] (compared to his dialysis center PTA) but agreed to eat sitting up after assist with dressing.   Patient performed upper body dressing sitting at edge of bed but required frequent rest breaks supine (4 breaks) due to "swimmy-headedness", dressing lower body supine with overall mod assist.  Patient completed slide board transfer to w/c with min assist and received setup for self-feeding as RN arrived.   See FIM for current functional status  Therapy/Group: Individual Therapy  Salome Spotted 05/21/2013, 12:50 PM

## 2013-05-21 NOTE — Progress Notes (Signed)
Physical Therapy Note  Patient Details  Name: Jack [name] MRN: [id number] Date of Birth: November 21, 1945 Today's Date: 05/21/2013  Time: 409-735 15 minutes  1:1 No c/o pain. Pt rec'd in bed eating breakfast, refused PT due to breakfast.  PT spoke with pt/daughter about importance of mobility, goals for PT and answered pt/daughter's questions about equipment and home set up.  Pt encouraged to participate fully with remainder of therapy throughout the day, pt states "I will get up later"   Time 2: 1400-1430 30 minutes (pt missed 30 minutes skilled PT due to lunch)  1:1 No c/o pain.  Treatment session focused on increasing independence with sliding board transfers, pt requires total cuing for set up despite performing multiple repetitions of transfer. Pt able to perform transfer with min A to a variety of surfaces with increased time due to fatigue.  Pt will likely need 24 hour supervision at home due to needing cuing for all tasks.  W/c mobility in controlled enviornment with min A for steering due to L UE weakness.  Danae Orleans Mariateresa Batra 05/21/2013, 8:16 AM

## 2013-05-22 ENCOUNTER — Inpatient Hospital Stay (HOSPITAL_COMMUNITY): Payer: Medicare Other | Admitting: *Deleted

## 2013-05-22 DIAGNOSIS — L98499 Non-pressure chronic ulcer of skin of other sites with unspecified severity: Secondary | ICD-10-CM

## 2013-05-22 DIAGNOSIS — I10 Essential (primary) hypertension: Secondary | ICD-10-CM

## 2013-05-22 DIAGNOSIS — S88119A Complete traumatic amputation at level between knee and ankle, unspecified lower leg, initial encounter: Secondary | ICD-10-CM

## 2013-05-22 DIAGNOSIS — I739 Peripheral vascular disease, unspecified: Secondary | ICD-10-CM

## 2013-05-22 LAB — GLUCOSE, CAPILLARY
GLUCOSE-CAPILLARY: 125 mg/dL — AB (ref 70–99)
GLUCOSE-CAPILLARY: 153 mg/dL — AB (ref 70–99)

## 2013-05-22 NOTE — Progress Notes (Signed)
Physical Therapy Session Note  Patient Details  Name: Jack [name] MRN: [id number] Date of Birth: [birth year]  Today's Date: 05/22/2013 Time: 2831-5176 Time Calculation (min): 45 min    Skilled Therapeutic Interventions/Progress Updates:  Patient resting in bed, nurse assisting in medication administration, assistance in dressing lower body , max A. Patient states he is tired and needs to take more breaks. Transfer to sit EOB with mod A. Sitting EOb and performing exercises to decrease R Knee contracture. Transfer to the w/c and back with sliding board with mod A. Patient requested to go to the bathroom, stand pivot transfer to and from commode with max A. Sit to stand performed 4 x with max A. Patient needs increased encouragement to participate in therapy.  Therapy Documentation Precautions:  Precautions Precautions: Fall Precaution Comments: Lt BKA NWB; fell 05/19/13 attempting to get back in bed without assistance Required Braces or Orthoses: Other Brace/Splint Restrictions Weight Bearing Restrictions: Yes LLE Weight Bearing: Non weight bearing Other Position/Activity Restrictions: strict NWB LLE   See FIM for current functional status  Therapy/Group: Individual Therapy  Guadlupe Spanish 05/22/2013, 12:45 PM

## 2013-05-22 NOTE — Progress Notes (Signed)
   PHYSICAL MEDICINE & REHABILITATION     PROGRESS NOTE    Subjective/Complaints: Stump and leg feel somewhat better today.  Objective: Vital Signs: Blood pressure 102/57, pulse 68, temperature 98.5 F (36.9 C), temperature source Oral, resp. rate 18, height 5\' 10"  (1.778 m), weight 153 lb 7 oz (69.6 kg), SpO2 98.00%.   Elderly male in no acute distress. Chest clear to auscultation. Cardiac exam S1-S2 are regular. Abdominal exam active bowel sounds, soft. Extremities with left BKA.  Assessment/Plan: 1. Functional deficits secondary to left BKA   Medical Problem List and Plan:  1. Functional deficits secondary to L-BKA   -Awaiting prosthesis. 2. DVT Prophylaxis/Anticoagulation: Pharmaceutical: Lovenox  3. Pain Management: Seems somewhat better. 4. Mood: continue egosupport. He has a good affect today.  5. Neuropsych: This patient is capable of making decisions on his own behalf.  6. ESRD: Continue HD on Mon, Wed, Fri after therapy day is completed 7. Hypotension: BP medications discontinued. Continue to monitor BP every 8 hours.   -encourage better po intake  -volume mgt per nephrology BP Readings from Last 3 Encounters:  05/22/13 102/57  05/18/13 104/57  05/18/13 104/57   8. Anemia of chronic disease:  9. MRSA bacteremia: IV vancomycin initiated 04/17/13--8 week duration  LOS (Days) 4 A FACE TO FACE EVALUATION WAS PERFORMED  Bruce H Swords 05/22/2013 10:05 AM

## 2013-05-22 NOTE — Progress Notes (Signed)
Subjective:  Feeling well, but states that dialysis yesterday was "rough" secondary to pain  Objective: Vital signs in last 24 hours: Temp:  [98 F (36.7 C)-98.5 F (36.9 C)] 98.5 F (36.9 C) (05/09 0606) Pulse Rate:  [67-84] 68 (05/09 0606) Resp:  [17-18] 18 (05/09 0606) BP: (91-153)/(55-118) 102/57 mmHg (05/09 0606) SpO2:  [98 %-100 %] 98 % (05/09 0606) Weight:  [69.2 kg (152 lb 8.9 oz)-69.6 kg (153 lb 7 oz)] 69.6 kg (153 lb 7 oz) (05/09 0606) Weight change: -0.4 kg (-14.1 oz)  Intake/Output from previous day: 05/08 0701 - 05/09 0700 In: 480 [P.O.:480] Out: 0    Lab Results:  Recent Labs  05/21/13 1538  WBC 8.7  HGB 9.3*  HCT 31.4*  PLT 316   BMET:  Recent Labs  05/21/13 1538  NA 139  K 4.0  CL 94*  CO2 28  GLUCOSE 207*  BUN 30*  CREATININE 5.60*  CALCIUM 9.7  ALBUMIN 2.3*   No results found for this basename: PTH,  in the last 72 hours Iron Studies: No results found for this basename: IRON, TIBC, TRANSFERRIN, FERRITIN,  in the last 72 hours  Studies/Results: No results found.  EXAM: General appearance:  Alert, in no apparent distress Resp: CTA without rales, rhonchi, or wheezes Cardio:  RRR with Gr II/VI systolic murmur, no rub GI: + BS, soft and nontender Extremities:  R TMA well healed, L BKA wrapped Access:  AVF @ LUA with + bruit  [facility]  4h 400/800 85.5kg dry wt Heparin 3500 LUA AVF  Assessment/Plan: 1. L foot gangrene - s/p L BKA 5/1 by Dr. [name], on Vancomycin; rehab since 5/4. 2. ESRD - HD on [facility], K 4.  Next HD 5/11. 3. HTN/Volume - BP 102/57, no meds; wt 69.6 kg s/p net UF 0.7 L yesterday. 4. Anemia - Hgb 9.3 on Aranesp 60 mcg on Fri. 5. Sec HPT - Ca 9.7 (11.1 corrected), P 2.9; no Hectorol, 2Ca bath, Fosrenol with meals. 6. Nutrition - Alb 2.3, soft diet, vitamin. 7. Multiple myeloma - follow-up with Oncology in June. 8. PVD - s/p L BKA 5/1, R TMA 12/3.   LOS: 4 days   Charles Lyles 05/22/2013,9:47 AM  Pt seen,  examined and agree w A/P as above.  Rob  MD pager [phone]    cell [phone] 05/22/2013, 11:16 AM     

## 2013-05-23 ENCOUNTER — Inpatient Hospital Stay (HOSPITAL_COMMUNITY): Payer: Medicare Other | Admitting: *Deleted

## 2013-05-23 NOTE — Progress Notes (Signed)
Subjective:  Feeling well, but states that dialysis yesterday was "rough" secondary to pain  Objective: Vital signs in last 24 hours: Temp:  [97.7 F (36.5 C)-98.4 F (36.9 C)] 97.7 F (36.5 C) (05/10 0600) Pulse Rate:  [59-82] 59 (05/10 0600) Resp:  [16-17] 17 (05/10 0600) BP: (96-106)/(51-69) 96/51 mmHg (05/10 0600) SpO2:  [98 %-100 %] 98 % (05/10 0600) Weight:  [70.1 kg (154 lb 8.7 oz)] 70.1 kg (154 lb 8.7 oz) (05/10 0600) Weight change: 0.7 kg (1 lb 8.7 oz)  Intake/Output from previous day: 05/09 0701 - 05/10 0700 In: 120 [P.O.:120] Out: -    Lab Results:  Recent Labs  05/21/13 1538  WBC 8.7  HGB 9.3*  HCT 31.4*  PLT 316   BMET:   Recent Labs  05/21/13 1538  NA 139  K 4.0  CL 94*  CO2 28  GLUCOSE 207*  BUN 30*  CREATININE 5.60*  CALCIUM 9.7  ALBUMIN 2.3*   No results found for this basename: PTH,  in the last 72 hours Iron Studies: No results found for this basename: IRON, TIBC, TRANSFERRIN, FERRITIN,  in the last 72 hours  Studies/Results: No results found.  EXAM: General appearance:  Alert, in no apparent distress Resp: CTA without rales, rhonchi, or wheezes Cardio:  RRR with Gr II/VI systolic murmur, no rub GI: + BS, soft and nontender Extremities:  R TMA well healed, L BKA wrapped no edema Access:  AVF @ LUA with + bruit  [facility]  4h 400/800 85.5kg dry wt Heparin 3500 LUA AVF  Assessment/Plan: 1. L foot gangrene - s/p L BKA 5/1, on Vancomycin; rehab since 5/4 2. MRSA bacteremia- need ID opinion this week about duration of Vanc course (initially "8 weeks"); has had progressive amputations since the initial bacteremia and a negative TEE on 4/7 3. ESRD on HD 4. HTN/Volume - BP's low side, no vol excess 5. Anemia on aranesp 6. Sec HPT - Ca 9.7 (11.1 corrected), P 2.9; no Hectorol, 2Ca bath, Fosrenol with meals. 7. Nutrition - Alb 2.3, soft diet, vitamin. 8. Multiple myeloma - follow-up with Oncology in June. 9. PVD - s/p L BKA 05/14/13, R  TMA 12/16/12  [name] MD (pgr) [phone]    [phone] 05/23/2013, 6:36 AM

## 2013-05-23 NOTE — Progress Notes (Signed)
  [facility] PHYSICAL MEDICINE & REHABILITATION     PROGRESS NOTE    Subjective/Complaints: No complaints this morning. He states he has no pain in his operative leg.  Objective: Vital Signs: Blood pressure 96/51, pulse 59, temperature 97.7 F (36.5 C), temperature source Oral, resp. rate 17, height 5\' 10"  (1.778 m), weight 154 lb 8.7 oz (70.1 kg), SpO2 98.00%.   Thin male in no acute distress. Chest: No increased work of breathing. Clear to auscultation Cardiac exam S1-S2 are regular Neurologic exam he is alert  Assessment/Plan: 1. Functional deficits secondary to left BKA   Medical Problem List and Plan:  1. Functional deficits secondary to L-BKA   -Awaiting prosthesis. 2. DVT Prophylaxis/Anticoagulation: Pharmaceutical: Lovenox  3. Pain Management: Seems somewhat better. 4. Mood: He is conversant and appears happy this morning.  5. Neuropsych: This patient is capable of making decisions on his own behalf.  6. ESRD: Continue HD on Mon, Wed, Fri after therapy day is completed 7. Hypotension: BP medications discontinued. Continue to monitor BP every 8 hours.   -encourage better po intake  -volume mgt per nephrology BP Readings from Last 3 Encounters:  05/23/13 96/51  05/18/13 104/57  05/18/13 104/57   8. Anemia of chronic disease:  9. MRSA bacteremia: IV vancomycin initiated 04/17/13--8 week duration  LOS (Days) 5 A FACE TO FACE EVALUATION WAS PERFORMED  Bruce H Swords 05/23/2013 8:43 AM

## 2013-05-23 NOTE — Progress Notes (Signed)
Occupational Therapy Session Note  Patient Details  Name: Jack [name] MRN: [id number] Date of Birth: 10-29-1945 Today's DATE:  5/10/ 2015  Time:  0730-0830  (60 min) Pain:  5/5 right LE Individual session:  1st session:  Pt eating breakfast upon OT arrival.  Offered to save tray and heat up but pt insisted on eating his food now.  Did set up for bathing and dressing.  Pt performed UB bathing and dressing EOB.  Needed mod assist from sidelying to sit EOB.   Maintained sitting balance with SBA.  Pt wanted to lie down at end of session due to fatigue.      Today's Date: 05/23/2013 Time: 1500-1530 2nd session Time Calculation (min): 30 min Skilled Therapeutic Interventions/Progress Updates:   2nd session:   Addressed UE therapband exercises with shoulder flexion, abduction, and extension.  No issues or pain.  Explained purpose was to boost self in wc and be more independent with transfers.  Pt. Verbalized understanding.  Left in bed with bed alarm activated. Therapy Documentation Precautions:  Precautions Precautions: Fall Precaution Comments: Lt BKA NWB; fell 05/19/13 attempting to get back in bed without assistance Required Braces or Orthoses: Other Brace/Splint Restrictions Weight Bearing Restrictions: Yes LLE Weight Bearing: Non weight bearing Other Position/Activity Restrictions: strict NWB LLE  Pain:  4/10 LLE phantom pain     See FIM for current functional status  Therapy/Group: Individual Therapy  Lisa Roca 05/23/2013, 3:14 PM

## 2013-05-23 NOTE — Progress Notes (Addendum)
Physical Therapy Session Note  Patient Details  Name: Jack [name] MRN: [id number] Date of Birth: September 16, 1945  Today's Date: 05/23/2013 Time: 0857-0957,1250-1320 Time Calculation (min): 30 min,30 min  Skilled Therapeutic Interventions/Progress Updates:  Session I 60 min Patient in bed, difficult to convince to participate in therapy. Min A to perforr supine to sit EOB, sitting with supervision, sliding board t/f to w/c with 75% verbal cues for sequencing, and min A for transfer.W/C propulsion traiining with increased cues, patient persents decreased velocity, takes frequent breaks and needs increased motivation to paticipate. In therapy gym patient performed 3 x sit to stand in II bars with mod A, ab;le to maintain standing for up to 40 seconds. Extended rest breaks needed due to fatigue.  Patient propelled his w/c back to the room with mod A,transfer to bed with sliding board min A and 100% of cues for positioning and sequencing. Patient in bed, alarm on and all needs within reach. Patient did not complain of pain, however he is experiencing strong phantom sensations,educated to desensitize the stump by gently compressing and rubbing. Session II  30 min Patient in bed , resting ,agrees to intervention as long as he can have his lunch after he gets up. Therefore his session had to be shorter as after getting out of bed he refused to continue,and wanted to eat. In supine, Quad sets x10, knee extension on L side 2 x10. Supine to sit with HOB elevated and use of hand rails, min A from therapist needed for trunk lift. Sitting EOB, patient was asked to verbalize steps of transfer, not able to correctly do so. Patient needs max verbal cues to perform transfer with sliding board ,even though he has done it few times earlier during the day. Transfer with mod A. Patient has been left in the room ,in w/c eating his lunch with QR belt on and all needs within reach. Nursing notified. Addendum While the  therapist was making that note,CNA has come by from patient's room and stated that patient is not eating and said he only told therapist he wants lunch so she can leave. Upon return to patients room, he admits he only wants to go back to bed. Education provided on importance of participating in therapy, patient agreed, but still refuses to do anymore than get back to bed. Transfer with sliding board-mod A.Alarm on and CNA in the room. Therapy Documentation Precautions:  Precautions Precautions: Fall Precaution Comments: Lt BKA NWB; fell 05/19/13 attempting to get back in bed without assistance Required Braces or Orthoses: Other Brace/Splint Restrictions Weight Bearing Restrictions: Yes LLE Weight Bearing: Non weight bearing Other Position/Activity Restrictions: strict NWB LLE  See FIM for current functional status  Therapy/Group: Individual Therapy  Guadlupe Spanish 05/23/2013, 10:13 AM

## 2013-05-23 NOTE — Progress Notes (Deleted)
Subjective:   Comfortable in bed, denies any pain, no complaints  Objective: Vital signs in last 24 hours: Temp:  [97.7 F (36.5 C)-98.4 F (36.9 C)] 97.7 F (36.5 C) (05/10 0600) Pulse Rate:  [59-82] 59 (05/10 0600) Resp:  [16-17] 17 (05/10 0600) BP: (96-106)/(51-69) 96/51 mmHg (05/10 0600) SpO2:  [98 %-100 %] 98 % (05/10 0600) Weight:  [70.1 kg (154 lb 8.7 oz)] 70.1 kg (154 lb 8.7 oz) (05/10 0600) Weight change: 0.7 kg (1 lb 8.7 oz)  Intake/Output from previous day: 05/09 0701 - 05/10 0700 In: 120 [P.O.:120] Out: -  Intake/Output this shift: Total I/O In: 120 [P.O.:120] Out: -   Lab Results:  Recent Labs  05/21/13 1538  WBC 8.7  HGB 9.3*  HCT 31.4*  PLT 316   BMET:  Recent Labs  05/21/13 1538  NA 139  K 4.0  CL 94*  CO2 28  GLUCOSE 207*  BUN 30*  CREATININE 5.60*  CALCIUM 9.7  ALBUMIN 2.3*   No results found for this basename: PTH,  in the last 72 hours Iron Studies: No results found for this basename: IRON, TIBC, TRANSFERRIN, FERRITIN,  in the last 72 hours  Studies/Results: No results found.  EXAM:  General appearance: Alert, in no apparent distress  Resp: CTA without rales, rhonchi, or wheezes  Cardio: RRR with Gr II/VI systolic murmur, no rub  GI: + BS, soft and nontender  Extremities: R TMA well healed, L BKA wrapped, no edema  Access: AVF @ LUA with + bruit   HD[facility]  4h 400/800 85.5kg dry wt Heparin 3500 LUA AVF  Assessment/Plan: 1. L foot gangrene - s/p L BKA 5/1, rehab since 5/4. 2. MRSA bacteremia - on Vancomycin since 4/4; need ID opinion this week about duration of Vanc course (initially "8 weeks"); has had progressive amputations since the initial bacteremia and a negative TEE on 4/7.  3. ESRD - HD on MWF @ [place].  Next HD tomorrow. 4. HTN/Volume - BPs low side, no vol excess, but regaining body mass and needs new EDW.  5. Anemia - Hgb 9.3, on Aranesp 60 mcg on Fri. 6. Sec HPT - Ca 9.7 (11.1 corrected), P 2.9; no Hectorol,  2Ca bath, Fosrenol with meals.  7. Nutrition - Alb 2.3, soft diet, vitamin.  8. Multiple myeloma - follow-up with Oncology in June.  9. PVD - s/p L BKA 05/14/13, R TMA 12/16/12     LOS: 5 days   Ramiro Harvest 05/23/2013,10:13 AM

## 2013-05-24 ENCOUNTER — Inpatient Hospital Stay (HOSPITAL_COMMUNITY): Payer: Medicare Other | Admitting: Occupational Therapy

## 2013-05-24 ENCOUNTER — Inpatient Hospital Stay (HOSPITAL_COMMUNITY): Payer: Medicare Other | Admitting: Physical Therapy

## 2013-05-24 LAB — RENAL FUNCTION PANEL
Albumin: 2.3 g/dL — ABNORMAL LOW (ref 3.5–5.2)
BUN: 54 mg/dL — ABNORMAL HIGH (ref 6–23)
CO2: 27 mEq/L (ref 19–32)
CREATININE: 7.3 mg/dL — AB (ref 0.50–1.35)
Calcium: 9.5 mg/dL (ref 8.4–10.5)
Chloride: 96 mEq/L (ref 96–112)
GFR calc Af Amer: 8 mL/min — ABNORMAL LOW (ref >90)
GFR calc non Af Amer: 7 mL/min — ABNORMAL LOW (ref >90)
Glucose, Bld: 207 mg/dL — ABNORMAL HIGH (ref 70–99)
Phosphorus: 3 mg/dL (ref 2.3–4.6)
Potassium: 4 mEq/L (ref 3.7–5.3)
Sodium: 137 mEq/L (ref 137–147)

## 2013-05-24 LAB — CBC
HCT: 28.1 % — ABNORMAL LOW (ref 39.0–52.0)
Hemoglobin: 8.3 g/dL — ABNORMAL LOW (ref 13.0–17.0)
MCH: 26.1 pg (ref 26.0–34.0)
MCHC: 29.5 g/dL — ABNORMAL LOW (ref 30.0–36.0)
MCV: 88.4 fL (ref 78.0–100.0)
Platelets: 287 10*3/uL (ref 150–400)
RBC: 3.18 MIL/uL — ABNORMAL LOW (ref 4.22–5.81)
RDW: 15.9 % — AB (ref 11.5–15.5)
WBC: 7.7 10*3/uL (ref 4.0–10.5)

## 2013-05-24 NOTE — Progress Notes (Signed)
[facility] PHYSICAL MEDICINE & REHABILITATION     PROGRESS NOTE    Subjective/Complaints: No problems this weekend. Pain controlled. Good appetite A 12 point review of systems has been performed and if not noted above is otherwise negative.   Objective: Vital Signs: Blood pressure 90/46, pulse 99, temperature 98 F (36.7 C), temperature source Oral, resp. rate 18, height 5\' 10"  (1.778 m), weight 71.3 kg (157 lb 3 oz), SpO2 100.00%. No results found.  Recent Labs  05/21/13 1538  WBC 8.7  HGB 9.3*  HCT 31.4*  PLT 316    Recent Labs  05/21/13 1538  NA 139  K 4.0  CL 94*  GLUCOSE 207*  BUN 30*  CREATININE 5.60*  CALCIUM 9.7   CBG (last 3)   Recent Labs  05/21/13 2017 05/22/13 0725 05/22/13 1117  GLUCAP 151* 125* 153*    Wt Readings from Last 3 Encounters:  05/24/13 71.3 kg (157 lb 3 oz)  05/17/13 67.7 kg (149 lb 4 oz)  05/17/13 67.7 kg (149 lb 4 oz)    Physical Exam:  Constitutional: He is oriented to person, place, and time.  Thin male-  HENT: oral mucosa pink/moist  Head: Normocephalic and atraumatic.  Eyes: Conjunctivae are normal.  Neck: Normal range of motion. Neck supple.  Cardiovascular: Normal rate and regular rhythm. No murmurs  Respiratory: Effort normal and breath sounds normal. No wheezes rales or rhonchi  GI: Soft. Bowel sounds are normal.  Musculoskeletal: He exhibits tenderness (left BKA site. )--tolerated dressing change.  Well healed right transmet site. Left hand with thenar atrophy and mild contracture. Left BKA well formed. Has ACE. Neurological: He is oriented to person, place, and time.  Appropriate affect.   Improving insight and awareness. Stocking-glove peripheral neuropathy. RLE weakness noted.  Skin: Skin is warm and dry. Wound clean and intact with minimal drainage. Psychiatric: His affect is blunt. He is withdrawn.    Assessment/Plan: 1. Functional deficits secondary to left BKA which require 3+ hours per day of  interdisciplinary therapy in a comprehensive inpatient rehab setting. Physiatrist is providing close team supervision and 24 hour management of active medical problems listed below. Physiatrist and rehab team continue to assess barriers to discharge/monitor patient progress toward functional and medical goals. FIM: FIM - Bathing Bathing Steps Patient Completed: Chest;Right Arm;Left Arm;Abdomen;Front perineal area;Buttocks;Right upper leg;Left upper leg Bathing: 5: Set-up assist to: Open items  FIM - Upper Body Dressing/Undressing Upper body dressing/undressing steps patient completed: Thread/unthread right sleeve of pullover shirt/dresss;Thread/unthread left sleeve of pullover shirt/dress;Put head through opening of pull over shirt/dress;Pull shirt over trunk Upper body dressing/undressing: 5: Set-up assist to: Obtain clothing/put away FIM - Lower Body Dressing/Undressing Lower body dressing/undressing steps patient completed: Pull underwear up/down Lower body dressing/undressing: 0: Activity did not occur  FIM - Toileting Toileting steps completed by patient: Performs perineal hygiene Toileting: 2: Max-Patient completed 1 of 3 steps  FIM - Radio producer Devices: Recruitment consultant Transfers: 0-Activity did not occur  FIM - Control and instrumentation engineer Devices: Sliding board;Arm rests Bed/Chair Transfer: 4: Sit > Supine: Min A (steadying pt. > 75%/lift 1 leg);4: Chair or W/C > Bed: Min A (steadying Pt. > 75%)  FIM - Locomotion: Wheelchair Distance: 100 Locomotion: Wheelchair: 2: Travels 50 - 149 ft with moderate assistance (Pt: 50 - 74%) FIM - Locomotion: Ambulation Locomotion: Ambulation: 0: Activity did not occur  Comprehension Comprehension Mode: Auditory Comprehension: 6-Follows complex conversation/direction: With extra time/assistive device  Expression Expression Mode:  Verbal Expression: 5-Expresses basic needs/ideas:  With extra time/assistive device  Social Interaction Social Interaction: 6-Interacts appropriately with others with medication or extra time (anti-anxiety, antidepressant).  Problem Solving Problem Solving: 3-Solves basic 50 - 74% of the time/requires cueing 25 - 49% of the time  Memory Memory: 3-Recognizes or recalls 50 - 74% of the time/requires cueing 25 - 49% of the time  Medical Problem List and Plan:  1. Functional deficits secondary to L-BKA   -prosthetic ed by [facility]  2. DVT Prophylaxis/Anticoagulation: Pharmaceutical: Lovenox  3. Pain Management: Reports pain with mobility and with dependency of LLE. Doing well so far today. continue neurontin 100mg /hs to help with neuropathic pain---spoke with pt/daughter about using this low dose 4. Mood: continue egosupport. Reasonable spirits at present  5. Neuropsych: This patient is capable of making decisions on his own behalf.  6. ESRD: Continue HD on Mon, Wed, Fri after therapy day is completed 7. Hypotension: BP medications discontinued. Continue to monitor BP every 8 hours.   -encourage better po intake  -volume mgt per nephrology 8. Anemia of chronic disease: Continue aranesp. Routine check of H/H with HD to monitor for stability/trends.  9. MRSA bacteremia: IV vancomycin initiated 04/17/13--8 week duration  LOS (Days) 6 A FACE TO FACE EVALUATION WAS PERFORMED  Meredith Staggers 05/24/2013 7:44 AM

## 2013-05-24 NOTE — Progress Notes (Signed)
Occupational Therapy Session Note  Patient Details  Name: Jack [name] MRN: [id number] Date of Birth: 04-14-1945  Today's Date: 05/24/2013 Time: 08:30-09:24 Time Calculation (min): 54 min  Skilled Therapeutic Interventions/Progress Updates:    Pt was seen for individual OT treatment session this am with focus on ADL retraining, increased activity tolerance, endurance and functional transfers using sliding board. Pt performed grooming and UB bathing at sink level seated. He then was supervision/min guard assist transferring from w/c to bed using sliding board and he completed LB bathing and dressing at bed level. He was noted to require 3 rest breaks throughout session as he fatigues easily during ADL's. After completing LB dressing & taking a rest break, pt used sliding board to transfer back to w/c from EOB (given supervision-min guard assist for placement of sliding board). He was sitting up in his w/c w/ his daughter present, phone and call bell were w/ in reach.  Therapy Documentation Precautions:  Precautions Precautions: Fall Precaution Comments: Lt BKA NWB; fell 05/19/13 attempting to get back in bed without assistance Required Braces or Orthoses: Other Brace/Splint Restrictions Weight Bearing Restrictions: Yes LLE Weight Bearing: Non weight bearing Other Position/Activity Restrictions: strict NWB LLE General:   Vital Signs: Therapy Vitals Pulse Rate: 65 BP: 131/76 mmHg Pain:   ADL: ADL ADL Comments: Refer to FIM     See FIM for current functional status  Therapy/Group: Individual Therapy  Amy B Barnhill 05/24/2013, 1:00 PM

## 2013-05-24 NOTE — Progress Notes (Signed)
Occupational Therapy Session Notes  Patient Details  Name: Jack [name] MRN: [id number] Date of Birth: [birth year]  Today's Date: 05/24/2013 Time: 1010-1110 Time Calculation (min): 60 min and missed 45 min of PM session due to fatigue  Short Term Goals: Week 1:  OT Short Term Goal 1 (Week 1): STG=LTG secondary to estimated SLOS  Skilled Therapeutic Interventions/Progress Updates:  1)  Patient resting in w/c upon arrival with daughter, [name] at his side.  Engaged in discussion of bathroom layout, w/c accessibility and DME options.  SW provided piece of paper with measurements of home environment when this OT pinned to back of bathroom door cork board.  Patient propelled his w/c to therapy bathroom with tub shower combination simulate to his home environment. Patient's daughter's ride arrived so she had to leave before in-depth discussion began.  If patient eventually takes a shower, it is recommended that he not use the shower chair he currently has and instead obtains a tub bench for safety and increased independence.  Shower DME to be deferred to [facility] secondary to patient reports he will sponge bathe until able to safely access his bathroom.  Unclear if w/c will fit in bathroom door as measurement provided for clearance is 23 1/2 inches.  Recommend a drop arm commode for toileting at this time.  2)  Declined 45 min session due to fatigue.  Reviewed cancellation policy and patient reports, "I'll make up for it tomorrow".  Therapy Documentation Precautions:  Precautions Precautions: Fall Precaution Comments: Lt BKA NWB; fell 05/19/13 attempting to get back in bed without assistance Required Braces or Orthoses: Other Brace/Splint Restrictions Weight Bearing Restrictions: Yes LLE Weight Bearing: Non weight bearing Other Position/Activity Restrictions: strict NWB LLE Pain: 7/10 left LE/residual limb, declined medication request for now, rest, repositioned, desensitization techniques.  See  FIM for current functional status  Therapy/Group: Individual Therapy  Gaye Pollack 05/24/2013, 12:32 PM

## 2013-05-24 NOTE — Progress Notes (Signed)
[facility] ROUNDING NOTE   Subjective:   Interval History:participating in rehab  Objective:  Vital signs in last 24 hours:  Temp:  [97.7 F (36.5 C)-98 F (36.7 C)] 98 F (36.7 C) (05/11 0500) Pulse Rate:  [63-99] 99 (05/11 0500) Resp:  [16-18] 18 (05/11 0500) BP: (90-115)/(46-53) 90/46 mmHg (05/11 0500) SpO2:  [95 %-100 %] 100 % (05/11 0500) Weight:  [71.3 kg (157 lb 3 oz)] 71.3 kg (157 lb 3 oz) (05/11 0500)  Weight change: 1.2 kg (2 lb 10.3 oz) Filed Weights   05/22/13 0606 05/23/13 0600 05/24/13 0500  Weight: 69.6 kg (153 lb 7 oz) 70.1 kg (154 lb 8.7 oz) 71.3 kg (157 lb 3 oz)    Intake/Output: I/O last 3 completed shifts: In: 360 [P.O.:360] Out: -    Intake/Output this shift:  Total I/O In: 360 [P.O.:360] Out: -   CVS- RRR RS- CTA ABD- BS present soft non-distended EXT- Extremities: R TMA well healed, L BKA wrapped, no edema  Access: AVF @ LUA with + bruit     Basic Metabolic Panel:  Recent Labs Lab 05/17/13 1202 05/21/13 1538  NA 133* 139  K 4.2 4.0  CL 93* 94*  CO2 24 28  GLUCOSE 112* 207*  BUN 26* 30*  CREATININE 6.71* 5.60*  CALCIUM 9.2 9.7  PHOS 3.9 2.9    Liver Function Tests:  Recent Labs Lab 05/17/13 1202 05/21/13 1538  ALBUMIN 2.1* 2.3*   No results found for this basename: LIPASE, AMYLASE,  in the last 168 hours No results found for this basename: AMMONIA,  in the last 168 hours  CBC:  Recent Labs Lab 05/21/13 1538  WBC 8.7  HGB 9.3*  HCT 31.4*  MCV 87.7  PLT 316    Cardiac Enzymes: No results found for this basename: CKTOTAL, CKMB, CKMBINDEX, TROPONINI,  in the last 168 hours  BNP: No components found with this basename: POCBNP,   CBG:  Recent Labs Lab 05/21/13 0720 05/21/13 1200 05/21/13 2017 05/22/13 0725 05/22/13 1117  GLUCAP 146* 116* 151* 125* 153*    Microbiology: Results for orders placed during the hospital encounter of 05/01/13  CULTURE, BLOOD (ROUTINE X 2)     Status: None    Collection Time    05/01/13  8:50 PM      Result Value Ref Range Status   Specimen Description BLOOD RIGHT ARM   Final   Special Requests BOTTLES DRAWN AEROBIC AND ANAEROBIC 10CC EA   Final   Culture  Setup Time     Final   Value: 05/02/2013 02:24     Performed at [facility]   Culture     Final   Value: NO GROWTH 5 DAYS     Performed at [facility]   Report Status 05/08/2013 FINAL   Final  CULTURE, BLOOD (ROUTINE X 2)     Status: None   Collection Time    05/01/13  9:00 PM      Result Value Ref Range Status   Specimen Description BLOOD RIGHT HAND   Final   Special Requests BOTTLES DRAWN AEROBIC ONLY [facility]   Final   Culture  Setup Time     Final   Value: 05/02/2013 02:24     Performed at [facility]   Culture     Final   Value: NO GROWTH 5 DAYS     Performed at [facility]   Report Status 05/08/2013 FINAL   Final  MRSA  PCR SCREENING     Status: None   Collection Time    05/02/13  1:08 AM      Result Value Ref Range Status   MRSA by PCR NEGATIVE  NEGATIVE Final   Comment:            The GeneXpert MRSA Assay (FDA     approved for NASAL specimens     only), is one component of a     comprehensive MRSA colonization     surveillance program. It is not     intended to diagnose MRSA     infection nor to guide or     monitor treatment for     MRSA infections.    Coagulation Studies: No results found for this basename: LABPROT, INR,  in the last 72 hours  Urinalysis: No results found for this basename: COLORURINE, APPERANCEUR, LABSPEC, PHURINE, GLUCOSEU, HGBUR, BILIRUBINUR, KETONESUR, PROTEINUR, UROBILINOGEN, NITRITE, LEUKOCYTESUR,  in the last 72 hours    Imaging: No results found.   Medications:     . colchicine  0.3 mg Oral Daily  . darbepoetin (ARANESP) injection - DIALYSIS  60 mcg Intravenous Q Fri-HD  . enoxaparin (LOVENOX) injection  30 mg Subcutaneous Q24H  . feeding supplement (PRO-STAT SUGAR FREE 64)  30 mL Oral TID WC   . gabapentin  100 mg Oral QHS  . heparin  20 Units/kg Dialysis Once in dialysis  . lanthanum  1,000 mg Oral TID WC  . levothyroxine  175 mcg Oral QAC breakfast  . multivitamin  1 tablet Oral QHS  . oxyCODONE  10 mg Oral BID  . vancomycin  1,000 mg Intravenous Q M,W,F-HD   aluminum hydroxide, feeding supplement (NEPRO CARB STEADY), guaiFENesin-dextromethorphan, lidocaine (PF), lidocaine-prilocaine, methocarbamol (ROBAXIN) IV, methocarbamol, metoCLOPramide (REGLAN) injection, metoCLOPramide, ondansetron (ZOFRAN) IV, ondansetron, oxyCODONE-acetaminophen, sorbitol, traZODone  Assessment/ Plan:   1. L foot gangrene - s/p L BKA 5/1, rehab since 5/4. 2. MRSA bacteremia - on Vancomycin since 4/4; 8 weeks treatment 3. ESRD - HD on MWF @ [place].  4. HTN/Volume - BPs low side will follow BP and manage ultrafiltration 5. Anemia - Hgb 9.3, on Aranesp 60 mcg on Fri. 6. Sec HPT - Ca 9.7 (11.1 corrected), P 2.9; no Hectorol, 2Ca bath, Fosrenol with meals.  7. Nutrition - Alb 2.3, soft diet, vitamin.  8. Multiple myeloma - follow-up with Oncology in June.  9. PVD - s/p L BKA 05/14/13, R TMA 12/16/12     LOS: [place] '@TODAY' '@8' :43 AM

## 2013-05-24 NOTE — Progress Notes (Signed)
Physical Therapy Note  Patient Details  Name: Jack [name] MRN: [id number] Date of Birth: 1945-12-31 Today's Date: 05/24/2013  Time: 730-810 40 minutes  1:1 No c/o pain.  Pt initially eating breakfast, refused PT until he finished.  PT used this time to educate pt's daughter on w/c set up, parts management, and PT goals for d/c.  Pt's daughter requires cues for w/c parts management.  Pt/family education with daughter on sliding board transfers w/c <> bed multiple attempts.  Pt/daughter require mod-max cuing, pt able to perform at min A level.  Pt's daughter will need more education before d/c home.   Danae Orleans Vianca Bracher 05/24/2013, 8:11 AM

## 2013-05-25 ENCOUNTER — Inpatient Hospital Stay (HOSPITAL_COMMUNITY): Payer: Medicare Other | Admitting: Physical Therapy

## 2013-05-25 ENCOUNTER — Inpatient Hospital Stay (HOSPITAL_COMMUNITY): Payer: Medicare Other | Admitting: Occupational Therapy

## 2013-05-25 ENCOUNTER — Inpatient Hospital Stay (HOSPITAL_COMMUNITY): Payer: Medicare Other

## 2013-05-25 DIAGNOSIS — L98499 Non-pressure chronic ulcer of skin of other sites with unspecified severity: Secondary | ICD-10-CM

## 2013-05-25 DIAGNOSIS — S88119A Complete traumatic amputation at level between knee and ankle, unspecified lower leg, initial encounter: Secondary | ICD-10-CM

## 2013-05-25 DIAGNOSIS — D631 Anemia in chronic kidney disease: Secondary | ICD-10-CM

## 2013-05-25 DIAGNOSIS — I739 Peripheral vascular disease, unspecified: Secondary | ICD-10-CM

## 2013-05-25 DIAGNOSIS — N039 Chronic nephritic syndrome with unspecified morphologic changes: Secondary | ICD-10-CM

## 2013-05-25 DIAGNOSIS — I10 Essential (primary) hypertension: Secondary | ICD-10-CM

## 2013-05-25 DIAGNOSIS — N186 End stage renal disease: Secondary | ICD-10-CM

## 2013-05-25 NOTE — Progress Notes (Signed)
Physical Therapy Session Note  Patient Details  Name: Jack [name] MRN: [id number] Date of Birth: 1945/11/02  Today's Date: 05/25/2013 Time: 1105-1205 Time Calculation (min): 60 min  Short Term Goals: Week 1:  PT Short Term Goal 1 (Week 1): = LTGs due to short LOS  Skilled Therapeutic Interventions/Progress Updates:    Co-treat with recreational therapy with focus on w/c propulsion throughout hospital and outdoors for community mobility training, education and discussion on amputee support group (contact info given for [employer] and [place] - pt reports he is "not ready yet" to talk to someone but info given for future and pt appreciative), educated on community re-entry and importance of continued activity past d/c, and assisted pt with transfer back to bed end of session with SB with min A. Pt required min A and up to mod A for w/c mobility with decreased use of LUE; 2 episodes outdoors of pt letting go with both hands to adjust LE while on hill and discussed safety implications as therapist had to stop w/c.  Therapy Documentation Precautions:  Precautions Precautions: Fall Precaution Comments: Lt BKA NWB; fell 05/19/13 attempting to get back in bed without assistance Required Braces or Orthoses: Other Brace/Splint Restrictions Weight Bearing Restrictions: Yes LLE Weight Bearing: Non weight bearing Other Position/Activity Restrictions: strict NWB LLE   Pain: Reports 5/10 pain in residual limb - premedicated.   See FIM for current functional status  Therapy/Group: Individual Therapy and Co-Treatment with Recreational Therapy  Lars Masson 05/25/2013, 12:10 PM

## 2013-05-25 NOTE — Progress Notes (Signed)
Occupational Therapy Session Notes  Patient Details  Name: Jack [name] MRN: [id number] Date of Birth: 04-10-1945  Today's Date: 05/25/2013 Time: 1000-1100 and 245-310 Time Calculation (min): 60 min and 25 min  Short Term Goals: Week 1:  OT Short Term Goal 1 (Week 1): STG=LTG secondary to estimated SLOS  Skilled Therapeutic Interventions/Progress Updates:  1)  Patient sleeping in bed upon arrival with daughter, [name], present at beginning of session.  Engaged in self care retraining to include sponge bath at bedside, dressing and bed>w/c transfers.  Focused session on activity tolerance, bed mobility, review and recall of techniques learned to improve safety and decreased need for physical assistance, bed>w/c via slide board with mod cues for safety regarding board placement and direction that he was sliding on the board.  Cori agreed to be present in AM for education.  2)  Patient resting in w/c upon arrival.  Engaged in toilet and bed transfer and toileting.  Focused session on activity tolerance, following directions, recall of techniques learned in previous sessions as well as instruction given during session.  Patient with difficulty following direction and required mod assist and mod cues for safety regarding w/c>drop arm>bed transfers without the slide board.  Verbal and demonstration instruction not always followed therefore patient had to be stopped in middle of transitions for safety reasons.  Patient able to perform pants down and up (underware not attempted during practice session) with mod-max cues to recall the task he was about to perform and for techniques and safety.  Reviewed with patient the concern for decreased recall and memory as it relates to safety and progress in rehab.  Patient agreed that he was concerned then he took for more pain medication.  Patient requested back to bed.    Therapy Documentation Precautions:  Precautions Precautions: Fall Precaution Comments:  Lt BKA NWB; fell 05/19/13 attempting to get back in bed without assistance Required Braces or Orthoses: Other Brace/Splint Restrictions Weight Bearing Restrictions: Yes LLE Weight Bearing: Non weight bearing Other Position/Activity Restrictions: strict NWB LLE Pain: 1)  No report of pain 2)  6/10 premedicated and more pain medication provided ADL: See FIM for current functional status  Therapy/Group: Individual Therapy both sessions  Gaye Pollack 05/25/2013, 2:44 PM

## 2013-05-25 NOTE — Progress Notes (Signed)
Recreational Therapy Assessment and Plan  Patient Details  Name: Jack [name] MRN: [id number] Date of Birth: September 05, 1945 Today's Date: 05/25/2013  Rehab Potential: Good ELOS:  7 days  AssessmentProblem List:  Patient Active Problem List    Diagnosis  Date Noted   .  BKA stump complication  29/52/8413   .  Asthma  05/17/2013   .  S/P BKA left leg  05/17/2013   .  FTT (failure to thrive) in adult  05/01/2013   .  History of MRSA infection  04/22/2013   .  Peripheral vascular disease, unspecified  11/19/2012   .  End-stage renal disease on hemodialysis  05/05/2012   .  Anemia in chronic kidney disease  03/19/2012   .  Iron deficiency  03/19/2012   .  Hypothyroidism  03/19/2012   .  Hypertension  03/18/2012   .  MGUS (monoclonal gammopathy of unknown significance)  02/28/2011    Past Medical History:  Past Medical History   Diagnosis  Date   .  Hypertension    .  Pneumonia      2012   .  Heart murmur    .  Glaucoma    .  Multiple myeloma, without mention of having achieved remission  03/30/2012     Cytogenetic neg on 03/23/2012.   .  Asthma    .  Hyperparathyroidism, secondary renal    .  Peripheral arterial disease    .  End-stage renal disease on hemodialysis      Started HD March 2014. Cause of ESRD was DM. Gets HD at [facility] on [place] on MWF schedule.   .  Diabetes mellitus without complication      Type 1   .  Thyroid disease      hyperparathyroidism   .  MRSA bacteremia    .  Anemia    .  Peripheral vascular disease, unspecified  11/19/2012     In the past had R foot toe amps then R TMA. In 2015 had left foot toe amp > then TMA >then L BKA on 05/14/13   .  History of MRSA infection  04/22/2013     Bacteremia assoc w L foot wound infection Mar 2015    Past Surgical History:  Past Surgical History   Procedure  Laterality  Date   .  Thyroidectomy     .  Cervical disc surgery     .  Eye surgery       CATARACTS   .  Insertion of dialysis catheter  Right   03/19/2012     Procedure: INSERTION OF DIALYSIS CATHETER; Surgeon: Mal Misty, MD; Location: [place]; Service: Vascular; Laterality: Right; Right Internal Jugular   .  Av fistula placement  Left  03/25/2012     Procedure: ARTERIOVENOUS (AV) FISTULA CREATION; Surgeon: Mal Misty, MD; Location: [place]; Service: Vascular; Laterality: Left;   .  Ligation of competing branches of arteriovenous fistula  Left  05/08/2012     Procedure: LIGATION OF COMPETING BRANCHES OF ARTERIOVENOUS FISTULA; Surgeon: Mal Misty, MD; Location: [place]; Service: Vascular; Laterality: Left; Ultrasound guided   .  Cardiac catheterization       approx 30 years ago   .  Amputation  Right  11/10/2012     Procedure: AMPUTATION FIRST and SECOND TOES Right Foot; Surgeon: Elam Dutch, MD; Location: [place]; Service: Vascular; Laterality: Right;   .  Transmetatarsal amputation  Left  12/16/2012  Procedure: TRANSMETATARSAL AMPUTATION AND VAC PLACEMENT; Surgeon: Elam Dutch, MD; Location: [place]; Service: Vascular; Laterality: Left;   .  Amputation  Left  04/07/2013     Procedure: AMPUTATION DIGIT- LEFT 1ST TOE; Surgeon: Mal Misty, MD; Location: [place]; Service: Vascular; Laterality: Left;   .  Tee without cardioversion  N/A  04/20/2013     Procedure: TRANSESOPHAGEAL ECHOCARDIOGRAM (TEE); Surgeon: Josue Hector, MD; Location: [facility]; Service: Cardiovascular; Laterality: N/A;   .  I&d extremity  Left  04/22/2013     Procedure: JHERDEYCXK GYJ DEBRIDEMENT LEFT FIRST TOE AMPUTATION WOUND ; Surgeon: Mal Misty, MD; Location: [place]; Service: Vascular; Laterality: Left;   .  Amputation  Left  04/26/2013     Procedure: Left Foot Transmetatarsal Amputation; Surgeon: Newt Minion, MD; Location: [place]; Service: Orthopedics; Laterality: Left;   .  Toe amputation       D/C 04-30-13   .  Below knee leg amputation  Left  05/14/2013     DR [name]   .  Amputation  Left  05/14/2013     Procedure: AMPUTATION BELOW KNEE; Surgeon:  Newt Minion, MD; Location: [place]; Service: Orthopedics; Laterality: Left; Left Below Knee Amputation    Assessment & Plan  Clinical Impression: Jack [name] is a 68 y.o. male with h/o of MM, ESRD-HD, DM type 2 with insensate neuropathy, recent MRSA bacteremia and poorly healing L-transmet amputation wound with progressive gangrenous changes. Was discharged to [facility] place for therapy and antibiotics. He continued to have poor wound healing and was admitted on 05/14/13 for L-BKA by Dr. Sharol Given. Post op HD ongoing and transfused of acute on chronic anemia. To continue IV vancomycin through 05/28/13 for bacteremia from foot wound. Attempted to get OOB independently and sustained a fall 05/18/13. Pt reported to OT that he just "forgot" that he couldn't walk. Patient transferred to CIR on 05/18/2013.   Pt presents with decreased activity tolerance, decreased functional mobility, decreased balance, decreased safety, decreased awareness, decreased problem solving, decreased memory Limiting pt's independence with leisure/community pursuits.  Leisure History/Participation Expression Interests: Music (Comment);Play instrument (Comment);Singing Awareness of Community Resources: Good-identify 3 post discharge leisure resources Psychosocial / Spiritual Social interaction - Mood/Behavior: Cooperative  Plan  min 1 time per week >20 minutes  Recommendations for other services: None  Discharge Criteria: Patient will be discharged from TR if patient refuses treatment 3 consecutive times without medical reason.  If treatment goals not met, if there is a change in medical status, if patient makes no progress towards goals or if patient is discharged from hospital.  The above assessment, treatment plan, treatment alternatives and goals were discussed and mutually agreed upon: by patient  Waldon Reining 05/25/2013, 2:48 PM

## 2013-05-25 NOTE — Progress Notes (Signed)
Physical Therapy Note  Patient Details  Name: Jack [name] MRN: [id number] Date of Birth: February 10, 1945 Today's Date: 05/25/2013  Time: 1300-1345 45 minutes  1:1 No c/o pain at rest, c/o mm soreness with mobility, eased with positioning and rest.  Sliding board transfer to car at simulated sedan height, pt able to perform with min A, cues for technique and safety.  Pt reports he can borrow his daughter's car for transport as he owns a full size SUV.  Practiced sliding board transfers to 27'' height to simulate pt's bed at home, pt able to perform with min A into bed, able to perform with min A out of bed without use of sliding board.  Supine and sidelying therex for B LE strengthening and ROM, pt with noted tightness in B hip flexors and residual limb flexion.  Pt able to tolerate prone lying x 90 seconds before limited by pain.  Discussed importance of daily stretching to prevent contracture.  Pt requires increased time and cuing for all mobility, but improving with strength and transfers.   [name] 05/25/2013, 1:49 PM

## 2013-05-25 NOTE — Progress Notes (Signed)
[place] PHYSICAL MEDICINE & REHABILITATION     PROGRESS NOTE    Subjective/Complaints: Had a good night. ?hallucinations briefly A 12 point review of systems has been performed and if not noted above is otherwise negative.   Objective: Vital Signs: Blood pressure 112/64, pulse 78, temperature 98.5 F (36.9 C), temperature source Oral, resp. rate 18, height 5\' 10"  (1.778 m), weight 73.1 kg (161 lb 2.5 oz), SpO2 100.00%. No results found.  Recent Labs  05/24/13 0800  WBC 7.7  HGB 8.3*  HCT 28.1*  PLT 287    Recent Labs  05/24/13 1300  NA 137  K 4.0  CL 96  GLUCOSE 207*  BUN 54*  CREATININE 7.30*  CALCIUM 9.5   CBG (last 3)   Recent Labs  05/22/13 1117  GLUCAP 153*    Wt Readings from Last 3 Encounters:  05/25/13 73.1 kg (161 lb 2.5 oz)  05/17/13 67.7 kg (149 lb 4 oz)  05/17/13 67.7 kg (149 lb 4 oz)    Physical Exam:  Constitutional: He is oriented to person, place, and time.   HENT: oral mucosa pink/moist  Head: Normocephalic and atraumatic.  Eyes: Conjunctivae are normal.  Neck: Normal range of motion. Neck supple.  Cardiovascular: Normal rate and regular rhythm. No murmurs  Respiratory: Effort normal and breath sounds normal. No wheezes rales or rhonchi  GI: Soft. Bowel sounds are normal.  Musculoskeletal: He exhibits tenderness (left BKA site. )--tolerated dressing change.  Well healed right transmet site. Left hand with thenar atrophy and mild contracture. Left BKA well formed. Has ACE. Neurological: He is oriented to person, place, and time.  Appropriate affect.   Improving insight and awareness. Stocking-glove peripheral neuropathy. RLE weakness noted.  Skin: Skin is warm and dry. Wound clean and intact with only slight drainage. Area appropriately tender Psychiatric: His affect is blunt. He is withdrawn.    Assessment/Plan: 1. Functional deficits secondary to left BKA which require 3+ hours per day of interdisciplinary therapy in a  comprehensive inpatient rehab setting. Physiatrist is providing close team supervision and 24 hour management of active medical problems listed below. Physiatrist and rehab team continue to assess barriers to discharge/monitor patient progress toward functional and medical goals. FIM: FIM - Bathing Bathing Steps Patient Completed: Chest;Right Arm;Left Arm;Abdomen;Front perineal area;Buttocks;Right upper leg;Left upper leg Bathing: 5: Set-up assist to: Open items  FIM - Upper Body Dressing/Undressing Upper body dressing/undressing steps patient completed: Thread/unthread right sleeve of pullover shirt/dresss;Thread/unthread left sleeve of pullover shirt/dress;Put head through opening of pull over shirt/dress;Pull shirt over trunk Upper body dressing/undressing: 5: Set-up assist to: Obtain clothing/put away FIM - Lower Body Dressing/Undressing Lower body dressing/undressing steps patient completed: Pull underwear up/down Lower body dressing/undressing: 5: Set-up assist to: Obtain clothing  FIM - Toileting Toileting steps completed by patient: Performs perineal hygiene Toileting: 2: Max-Patient completed 1 of 3 steps  FIM - Radio producer Devices: Recruitment consultant Transfers: 0-Activity did not occur  FIM - Control and instrumentation engineer Devices:  (scoot/squat pivot without SB) Bed/Chair Transfer: 3: Bed > Chair or W/C: Mod A (lift or lower assist);3: Chair or W/C > Bed: Mod A (lift or lower assist)  FIM - Locomotion: Wheelchair Distance: 100 Locomotion: Wheelchair: 2: Travels 50 - 149 ft with moderate assistance (Pt: 50 - 74%) FIM - Locomotion: Ambulation Locomotion: Ambulation: 0: Activity did not occur  Comprehension Comprehension Mode: Auditory Comprehension: 5-Understands complex 90% of the time/Cues < 10% of the time  Expression Expression  Mode: Verbal Expression: 5-Expresses basic needs/ideas: With no assist  Social  Interaction Social Interaction: 6-Interacts appropriately with others with medication or extra time (anti-anxiety, antidepressant).  Problem Solving Problem Solving: 3-Solves basic 50 - 74% of the time/requires cueing 25 - 49% of the time  Memory Memory: 3-Recognizes or recalls 50 - 74% of the time/requires cueing 25 - 49% of the time  Medical Problem List and Plan:  1. Functional deficits secondary to L-BKA   -prosthetic ed by [facility]  2. DVT Prophylaxis/Anticoagulation: Pharmaceutical: Lovenox  3. Pain Management: Reports pain with mobility and with dependency of LLE. Doing well so far today. continue neurontin 100mg /hs to help with neuropathic pain---spoke with pt/daughter about using this low dose 4. Mood: continue egosupport. Reasonable spirits at present  5. Neuropsych: This patient is capable of making decisions on his own behalf.  6. ESRD: Continue HD on Mon, Wed, Fri after therapy day is completed 7. Hypotension: BP medications discontinued.    -encourage better po intake  -volume mgt per nephrology 8. Anemia of chronic disease: Continue aranesp. Routine check of H/H with HD to monitor for stability/trends.  9. MRSA bacteremia: IV vancomycin initiated 04/17/13--8 week duration  LOS (Days) 7 A FACE TO FACE EVALUATION WAS PERFORMED  Meredith Staggers 05/25/2013 7:51 AM

## 2013-05-25 NOTE — Progress Notes (Signed)
Social Work Patient ID: Porfirio Oar, male   DOB: Sep 06, 1945, 68 y.o.   MRN: [id number]  CSW met with pt and his dtr, [name], yesterday to discuss d/c planning. Cori participated with some family education and PT wondered if pt's brother would be able to come in for training, as well.  Discussed doorways at home and support pt will have.  CSW passed doorway measurements on to the therapists.  Dtr explained that pt's brother will stay with them and help through June until pt's other dtr is finished with school.  Then she will assist pt.  Discussed pt's car Photographer) vs. dtr's car ([employer]).  PT feels sedan will be easier and will discuss this with pt.  CSW arranged home health and DME and CSW will make sure this is delivered correctly.  CSW also encouraged pt to complete [facility]AT application in case he needs help with transportation to dialysis.  CSW will continue to follow and assist as needed.

## 2013-05-25 NOTE — Progress Notes (Addendum)
Recreational Therapy Session Note  Patient Details  Name: Jack [name] MRN: [id number] Date of Birth: [birth year] Today's Date: 05/25/2013  Pain: no c/o Skilled Therapeutic Interventions/Progress Updates: Session focused on activity tolerance, w/c mobility on indoor/outdoor surfaces, education remaining active & involved in leisure & community pursuits.  Pt propelled w/c with min-mod assist on level surfaces due to LUE weakness requiring steering assistance.  Pt with instances of letting go of w/c during w/c mobility on uneven surfaces in which therapist had to mange w/c with total assist.  Discussion with pt about overall safety concerns.  Also discussed peer support program & provided handouts with reference information for pt use at a later date.  Pt stated that he is not yet ready to talk to someone, but appreciative of resources.2 Therapy/Group: Co-Treatment   Waldon Reining 05/25/2013, 4:04 PM

## 2013-05-25 NOTE — Progress Notes (Signed)
[facility] ROUNDING NOTE   Subjective:   Interval History: in rehab   Objective:  Vital signs in last 24 hours:  Temp:  [98.1 F (36.7 C)-98.8 F (37.1 C)] 98.5 F (36.9 C) (05/12 0610) Pulse Rate:  [61-80] 78 (05/12 0610) Resp:  [17-20] 18 (05/12 0610) BP: (89-131)/(40-76) 112/64 mmHg (05/12 0610) SpO2:  [98 %-100 %] 100 % (05/12 0610) Weight:  [71.2 kg (156 lb 15.5 oz)-73.1 kg (161 lb 2.5 oz)] 73.1 kg (161 lb 2.5 oz) (05/12 0610)  Weight change: -0.1 kg (-3.5 oz) Filed Weights   05/24/13 1907 05/24/13 2005 05/25/13 0610  Weight: 71.3 kg (157 lb 3 oz) 71.6 kg (157 lb 13.6 oz) 73.1 kg (161 lb 2.5 oz)    Intake/Output: I/O last 3 completed shifts: In: 720 [P.O.:720] Out: 0    Intake/Output this shift:  Total I/O In: 360 [P.O.:360] Out: -   CVS- RRR  RS- CTA  ABD- BS present soft non-distended  EXT- Extremities: R TMA well healed, L BKA wrapped, no edema  Access: AVF @ LUA with + bruit     Basic Metabolic Panel:  Recent Labs Lab 05/21/13 1538 05/24/13 1300  NA 139 137  K 4.0 4.0  CL 94* 96  CO2 28 27  GLUCOSE 207* 207*  BUN 30* 54*  CREATININE 5.60* 7.30*  CALCIUM 9.7 9.5  PHOS 2.9 3.0    Liver Function Tests:  Recent Labs Lab 05/21/13 1538 05/24/13 1300  ALBUMIN 2.3* 2.3*   No results found for this basename: LIPASE, AMYLASE,  in the last 168 hours No results found for this basename: AMMONIA,  in the last 168 hours  CBC:  Recent Labs Lab 05/21/13 1538 05/24/13 0800  WBC 8.7 7.7  HGB 9.3* 8.3*  HCT 31.4* 28.1*  MCV 87.7 88.4  PLT 316 287    Cardiac Enzymes: No results found for this basename: CKTOTAL, CKMB, CKMBINDEX, TROPONINI,  in the last 168 hours  BNP: No components found with this basename: POCBNP,   CBG:  Recent Labs Lab 05/21/13 0720 05/21/13 1200 05/21/13 2017 05/22/13 0725 05/22/13 1117  GLUCAP 146* 116* 151* 125* 153*    Microbiology: Results for orders placed during the hospital encounter  of 05/01/13  CULTURE, BLOOD (ROUTINE X 2)     Status: None   Collection Time    05/01/13  8:50 PM      Result Value Ref Range Status   Specimen Description BLOOD RIGHT ARM   Final   Special Requests BOTTLES DRAWN AEROBIC AND ANAEROBIC 10CC EA   Final   Culture  Setup Time     Final   Value: 05/02/2013 02:24     Performed at [facility]   Culture     Final   Value: NO GROWTH 5 DAYS     Performed at [facility]   Report Status 05/08/2013 FINAL   Final  CULTURE, BLOOD (ROUTINE X 2)     Status: None   Collection Time    05/01/13  9:00 PM      Result Value Ref Range Status   Specimen Description BLOOD RIGHT HAND   Final   Special Requests BOTTLES DRAWN AEROBIC ONLY [facility]   Final   Culture  Setup Time     Final   Value: 05/02/2013 02:24     Performed at [facility]   Culture     Final   Value: NO GROWTH 5 DAYS     Performed at  [facility]   Report Status 05/08/2013 FINAL   Final  MRSA PCR SCREENING     Status: None   Collection Time    05/02/13  1:08 AM      Result Value Ref Range Status   MRSA by PCR NEGATIVE  NEGATIVE Final   Comment:            The GeneXpert MRSA Assay (FDA     approved for NASAL specimens     only), is one component of a     comprehensive MRSA colonization     surveillance program. It is not     intended to diagnose MRSA     infection nor to guide or     monitor treatment for     MRSA infections.    Coagulation Studies: No results found for this basename: LABPROT, INR,  in the last 72 hours  Urinalysis: No results found for this basename: COLORURINE, APPERANCEUR, LABSPEC, PHURINE, GLUCOSEU, HGBUR, BILIRUBINUR, KETONESUR, PROTEINUR, UROBILINOGEN, NITRITE, LEUKOCYTESUR,  in the last 72 hours    Imaging: No results found.   Medications:     . colchicine  0.3 mg Oral Daily  . darbepoetin (ARANESP) injection - DIALYSIS  60 mcg Intravenous Q Fri-HD  . enoxaparin (LOVENOX) injection  30 mg Subcutaneous Q24H  .  feeding supplement (PRO-STAT SUGAR FREE 64)  30 mL Oral TID WC  . gabapentin  100 mg Oral QHS  . heparin  20 Units/kg Dialysis Once in dialysis  . lanthanum  1,000 mg Oral TID WC  . levothyroxine  175 mcg Oral QAC breakfast  . multivitamin  1 tablet Oral QHS  . oxyCODONE  10 mg Oral BID  . vancomycin  1,000 mg Intravenous Q M,W,F-HD   aluminum hydroxide, feeding supplement (NEPRO CARB STEADY), guaiFENesin-dextromethorphan, lidocaine (PF), lidocaine-prilocaine, methocarbamol (ROBAXIN) IV, methocarbamol, metoCLOPramide (REGLAN) injection, metoCLOPramide, ondansetron (ZOFRAN) IV, ondansetron, oxyCODONE-acetaminophen, sorbitol, traZODone  Assessment/ Plan:  1. L foot gangrene - s/p L BKA 5/1, rehab since 5/4. 2. MRSA bacteremia - on Vancomycin since 4/4; 8 weeks treatment 3. ESRD - HD on MWF @ [place].  4. HTN/Volume - BPs low side will follow BP and manage ultrafiltration 5. Anemia - Hgb 8.3 6. Sec HPT - Ca 9.5 (11.1 corrected), P 2.9; no Hectorol, 2Ca bath, Fosrenol with meals.  7. Nutrition - Alb 2.3, soft diet, vitamin.  8. Multiple myeloma - follow-up with Oncology in June.  9. PVD - s/p L BKA 05/14/13, R TMA 12/16/12      LOS: 7 Sherril Croon _0 _1 :33 AM

## 2013-05-26 ENCOUNTER — Inpatient Hospital Stay (HOSPITAL_COMMUNITY): Payer: Medicare Other | Admitting: Occupational Therapy

## 2013-05-26 ENCOUNTER — Inpatient Hospital Stay (HOSPITAL_COMMUNITY): Payer: Medicare Other | Admitting: *Deleted

## 2013-05-26 ENCOUNTER — Inpatient Hospital Stay (HOSPITAL_COMMUNITY): Payer: Medicare Other | Admitting: Physical Therapy

## 2013-05-26 DIAGNOSIS — I739 Peripheral vascular disease, unspecified: Secondary | ICD-10-CM

## 2013-05-26 DIAGNOSIS — N039 Chronic nephritic syndrome with unspecified morphologic changes: Secondary | ICD-10-CM

## 2013-05-26 DIAGNOSIS — L98499 Non-pressure chronic ulcer of skin of other sites with unspecified severity: Secondary | ICD-10-CM

## 2013-05-26 DIAGNOSIS — D631 Anemia in chronic kidney disease: Secondary | ICD-10-CM

## 2013-05-26 DIAGNOSIS — S88119A Complete traumatic amputation at level between knee and ankle, unspecified lower leg, initial encounter: Secondary | ICD-10-CM

## 2013-05-26 DIAGNOSIS — I10 Essential (primary) hypertension: Secondary | ICD-10-CM

## 2013-05-26 DIAGNOSIS — N186 End stage renal disease: Secondary | ICD-10-CM

## 2013-05-26 LAB — HEPATITIS B SURFACE ANTIGEN: Hepatitis B Surface Ag: NEGATIVE

## 2013-05-26 MED ORDER — FLEET ENEMA 7-19 GM/118ML RE ENEM: 1.0000 | ENEMA | Freq: Every day | RECTAL | Status: DC | PRN

## 2013-05-26 MED ORDER — SENNOSIDES-DOCUSATE SODIUM 8.6-50 MG PO TABS
2.0000 | ORAL_TABLET | Freq: Every day | ORAL | Status: DC
  Administered 2013-05-26 – 2013-05-27 (×2): 2 via ORAL
  Filled 2013-05-26 (×3): qty 2

## 2013-05-26 MED ORDER — BISACODYL 10 MG RE SUPP
10.0000 mg | Freq: Every day | RECTAL | Status: DC | PRN
  Administered 2013-05-26: 10 mg via RECTAL
  Filled 2013-05-26: qty 1

## 2013-05-26 NOTE — Progress Notes (Addendum)
Physical Therapy Session Note  Patient Details  Name: Jack [name] MRN: [id number] Date of Birth: [birth year]  Today's Date: 05/26/2013 Time: 5188-4166 Time Calculation (min): 60 min  Short Term Goals: Week 1:  PT Short Term Goal 1 (Week 1): = LTGs due to short LOS  Skilled Therapeutic Interventions/Progress Updates:   Session focused on safety with slide board transfers. Family not available for training. Pt received supine in bed, agreeable to therapy. Pt transferred supine <> sit with supervision. Pt performed bed <> drop arm commode with SB x 2 and without SB x 1 and min assist with verbal cues for safety, sequencing, and technique. After supine rest, SB transfer bed/mat <> w/c min A. Patient instructed to direct setup for transfer, needs max questioning cues for safe setup. W/c propulsion room > gym x 100 ft using BUEs with min A. Pt able to tolerate prone hip flexor stretch x 5 min on mat, SAQ with 3 sec hold x 15. Pt returned to room and left sitting in w/c with quick release belt on and all needs within reach.    Therapy Documentation Precautions:  Precautions Precautions: Fall Precaution Comments: Lt BKA NWB; fell 05/19/13 attempting to get back in bed without assistance Required Braces or Orthoses: Other Brace/Splint Restrictions Weight Bearing Restrictions: Yes LLE Weight Bearing: Non weight bearing Other Position/Activity Restrictions: strict NWB LLE Pain: Pain Assessment Pain Assessment: No/denies pain  See FIM for current functional status  Therapy/Group: Individual Therapy  Laretta Alstrom 05/26/2013, 4:25 PM

## 2013-05-26 NOTE — Progress Notes (Signed)
NUTRITION FOLLOW UP  Intervention:   Continue Dysphagia 3 diet  Continue Nepro Shake PRN  Continue renal multivitamin  Continue Snacks TID  Continue Prostat TID  RD to continue to follow nutrition care plan  Nutrition Dx:   Malnutrition related to ESRD as evidenced by energy intake of </=75% x 1 month and severe depletion of muscle mass; ongoing  Goal:   Meet >/=90% of estimated nutrition needs   Monitor:   PO intake, supplement acceptance, weight trend, labs   Assessment:   Patient is a 68 year old male with history of diabetes, hypertension, hypothyroidism, ESRD on HD MWF, MRSA bacteremia   4/13-Patient is s/p left foot trans metatarsal amputation for gangrene left foot.  5/1- Patient is s/p left transtibial amputation  5/5- Transferred to Rehab  Per RN, pt is eating much better and has a better appetite. Pt is taking his Prostat supplements but not taking the Nepro. Per EPIC chart pt ate 75 - 100% of all meals yesterday. Pt states he feels a lot better and is liking the food better.   Pt continues to receive snacks TID and would like to continue this.   RD to continue to follow and monitor PO intake and supplement acceptance.   Height: Ht Readings from Last 1 Encounters:  05/18/13 5\' 10"  (1.778 m)    Weight Status:   Wt Readings from Last 1 Encounters:  05/26/13 162 lb 11.2 oz (73.8 kg)   Re-estimated needs:  Kcal: 2000 - 2200 Protein: 100 - 115 grams  Fluid: >/= 1.2 L/day   Skin: Pressure Ulcer R Heel  Stage II Pressure Ulcer Coccyx    Diet Order: Dysphagia 3    Intake/Output Summary (Last 24 hours) at 05/26/13 1201 Last data filed at 05/26/13 0700  Gross per 24 hour  Intake    480 ml  Output      0 ml  Net    480 ml    Last BM: 5/9   Labs:   Recent Labs Lab 05/21/13 1538 05/24/13 1300  NA 139 137  K 4.0 4.0  CL 94* 96  CO2 28 27  BUN 30* 54*  CREATININE 5.60* 7.30*  CALCIUM 9.7 9.5  PHOS 2.9 3.0  GLUCOSE 207* 207*    CBG (last 3)   No results found for this basename: GLUCAP,  in the last 72 hours  Scheduled Meds: . colchicine  0.3 mg Oral Daily  . darbepoetin (ARANESP) injection - DIALYSIS  60 mcg Intravenous Q Fri-HD  . enoxaparin (LOVENOX) injection  30 mg Subcutaneous Q24H  . feeding supplement (PRO-STAT SUGAR FREE 64)  30 mL Oral TID WC  . heparin  20 Units/kg Dialysis Once in dialysis  . lanthanum  1,000 mg Oral TID WC  . levothyroxine  175 mcg Oral QAC breakfast  . multivitamin  1 tablet Oral QHS  . oxyCODONE  10 mg Oral BID  . vancomycin  1,000 mg Intravenous Q M,W,F-HD    Continuous Infusions:   Carrolyn Leigh, BS Dietetic Intern Pager: [phone]

## 2013-05-26 NOTE — Progress Notes (Signed)
Physical Therapy Session Note  Patient Details  Name: Jack [name] MRN: [id number] Date of Birth: 06/18/1945  Today's Date: 05/26/2013 Time: 1130-1200 Time Calculation (min): 30 min  Short Term Goals: Week 1:  PT Short Term Goal 1 (Week 1): = LTGs due to short LOS  Skilled Therapeutic Interventions/Progress Updates:    Session focused on family education with focus on functional SB transfers OOB and simulated car transfer to sedan height. Pt and family requiring cues for correct placement of w/c and SB to set-up for transfer.  Therapy Documentation Precautions:  Precautions Precautions: Fall Precaution Comments: Lt BKA NWB; fell 05/19/13 attempting to get back in bed without assistance Required Braces or Orthoses: Other Brace/Splint Restrictions Weight Bearing Restrictions: Yes LLE Weight Bearing: Non weight bearing Other Position/Activity Restrictions: strict NWB LLE  Pain: No complaints.  See FIM for current functional status  Therapy/Group: Individual Therapy  Lars Masson 05/26/2013, 12:02 PM

## 2013-05-26 NOTE — Progress Notes (Signed)
Occupational Therapy Session Note  Patient Details  Name: Jack [name] MRN: [id number] Date of Birth: [birth year]  Today's Date: 05/26/2013 Time: 0930-1015 Time Calculation (min): 45 min  Short Term Goals: Week 1:  OT Short Term Goal 1 (Week 1): STG=LTG secondary to estimated SLOS  Skilled Therapeutic Interventions/Progress Updates:  Patient resting in bed upon arrival with daughter, [name] and brother, Richardson Landry present for family education.  Patient reports feeling very tired yet agreed to complete bath and dressing EOB.  Patient does not want his daughters to assist with tasks such as bathing and dressing or with toileting due to modesty.  Patient's daughter reports that she and her sister will be glad to assist in [place] so after she set him up for bath and dress EOB she sat behind the curtain.  Patient's brother states he can be available for car transfers and maybe getting in/out of bed but does not live with patient.  Patient has not had difficulty donning his underware before however, this morning he had to take them back off 2 times then this OT intervened to include vc to donn underware over right foot first.  When patient began to don pants, his daughter provided vc (from behind curtain) to put right foot in first and patient able to complete task without assistance.  Demonstrated drop arm commode and the safety concern related to using the sliding board with this DME.  Since we were limited by time, daughter and brother stated that they would be here for the afternoon PT session today therefore this OT will ask that the PT practice this transfer with family.  Family have verbalized that they will be here again for education tomorrow.  Therapy Documentation Precautions:  Precautions Precautions: Fall Precaution Comments: Lt BKA NWB; fell 05/19/13 attempting to get back in bed without assistance Required Braces or Orthoses: Other Brace/Splint Restrictions Weight Bearing Restrictions:  Yes LLE Weight Bearing: Non weight bearing Other Position/Activity Restrictions: strict NWB LLE Pain: No reports of pain ADL: See FIM for current functional status  Therapy/Group: Individual Therapy  Gaye Pollack 05/26/2013, 12:21 PM

## 2013-05-26 NOTE — Progress Notes (Signed)
Patient evaluated for community based chronic disease management services with [facility] as a benefit of patient's [employer]. Spoke with patient at bedside to explain [facility] services.  Services have been accepted with written consents.  Daughter Octave Montrose is authorized contact at [phone].  Informed patient that he can complete HCPOA documentation during his inpatient stay.  Patient will receive a post discharge transition of care call and will be evaluated for monthly home visits for assessments and disease process education.  Left contact information and [facility] literature at bedside. Made Inpatient Case Manager aware that [facility] following. Of note, [facility] Care Management services does not replace or interfere with any services that are arranged by inpatient case management or social work.  For additional questions or referrals please contact Corliss Blacker BSN RN [facility] Liaison at [phone].

## 2013-05-26 NOTE — Progress Notes (Signed)
[facility] ROUNDING NOTE   Subjective:   Interval History: bathing in room this morning  Objective:  Vital signs in last 24 hours:  Temp:  [98.3 F (36.8 C)-98.8 F (37.1 C)] 98.3 F (36.8 C) (05/13 0500) Pulse Rate:  [57-91] 58 (05/13 0500) Resp:  [17-18] 18 (05/13 0500) BP: (101-129)/(51-72) 101/51 mmHg (05/13 0500) SpO2:  [97 %-100 %] 100 % (05/13 0500) Weight:  [73.8 kg (162 lb 11.2 oz)] 73.8 kg (162 lb 11.2 oz) (05/13 0500)  Weight change: 2.6 kg (5 lb 11.7 oz) Filed Weights   05/24/13 2005 05/25/13 0610 05/26/13 0500  Weight: 71.6 kg (157 lb 13.6 oz) 73.1 kg (161 lb 2.5 oz) 73.8 kg (162 lb 11.2 oz)    Intake/Output: I/O last 3 completed shifts: In: 960 [P.O.:960] Out: 0    Intake/Output this shift:     CVS- RRR  RS- CTA  ABD- BS present soft non-distended  EXT- Extremities: R TMA well healed, L BKA wrapped, no edema  Access: AVF @ LUA with + bruit     Basic Metabolic Panel:  Recent Labs Lab 05/21/13 1538 05/24/13 1300  NA 139 137  K 4.0 4.0  CL 94* 96  CO2 28 27  GLUCOSE 207* 207*  BUN 30* 54*  CREATININE 5.60* 7.30*  CALCIUM 9.7 9.5  PHOS 2.9 3.0    Liver Function Tests:  Recent Labs Lab 05/21/13 1538 05/24/13 1300  ALBUMIN 2.3* 2.3*   No results found for this basename: LIPASE, AMYLASE,  in the last 168 hours No results found for this basename: AMMONIA,  in the last 168 hours  CBC:  Recent Labs Lab 05/21/13 1538 05/24/13 0800  WBC 8.7 7.7  HGB 9.3* 8.3*  HCT 31.4* 28.1*  MCV 87.7 88.4  PLT 316 287    Cardiac Enzymes: No results found for this basename: CKTOTAL, CKMB, CKMBINDEX, TROPONINI,  in the last 168 hours  BNP: No components found with this basename: POCBNP,   CBG:  Recent Labs Lab 05/21/13 0720 05/21/13 1200 05/21/13 2017 05/22/13 0725 05/22/13 1117  GLUCAP 146* 116* 151* 125* 153*    Microbiology: Results for orders placed during the hospital encounter of 05/01/13  CULTURE, BLOOD  (ROUTINE X 2)     Status: None   Collection Time    05/01/13  8:50 PM      Result Value Ref Range Status   Specimen Description BLOOD RIGHT ARM   Final   Special Requests BOTTLES DRAWN AEROBIC AND ANAEROBIC 10CC EA   Final   Culture  Setup Time     Final   Value: 05/02/2013 02:24     Performed at [facility]   Culture     Final   Value: NO GROWTH 5 DAYS     Performed at [facility]   Report Status 05/08/2013 FINAL   Final  CULTURE, BLOOD (ROUTINE X 2)     Status: None   Collection Time    05/01/13  9:00 PM      Result Value Ref Range Status   Specimen Description BLOOD RIGHT HAND   Final   Special Requests BOTTLES DRAWN AEROBIC ONLY [facility]   Final   Culture  Setup Time     Final   Value: 05/02/2013 02:24     Performed at [facility]   Culture     Final   Value: NO GROWTH 5 DAYS     Performed at [facility]   Report Status  05/08/2013 FINAL   Final  MRSA PCR SCREENING     Status: None   Collection Time    05/02/13  1:08 AM      Result Value Ref Range Status   MRSA by PCR NEGATIVE  NEGATIVE Final   Comment:            The GeneXpert MRSA Assay (FDA     approved for NASAL specimens     only), is one component of a     comprehensive MRSA colonization     surveillance program. It is not     intended to diagnose MRSA     infection nor to guide or     monitor treatment for     MRSA infections.    Coagulation Studies: No results found for this basename: LABPROT, INR,  in the last 72 hours  Urinalysis: No results found for this basename: COLORURINE, APPERANCEUR, LABSPEC, PHURINE, GLUCOSEU, HGBUR, BILIRUBINUR, KETONESUR, PROTEINUR, UROBILINOGEN, NITRITE, LEUKOCYTESUR,  in the last 72 hours    Imaging: No results found.   Medications:     . colchicine  0.3 mg Oral Daily  . darbepoetin (ARANESP) injection - DIALYSIS  60 mcg Intravenous Q Fri-HD  . enoxaparin (LOVENOX) injection  30 mg Subcutaneous Q24H  . feeding supplement (PRO-STAT  SUGAR FREE 64)  30 mL Oral TID WC  . heparin  20 Units/kg Dialysis Once in dialysis  . lanthanum  1,000 mg Oral TID WC  . levothyroxine  175 mcg Oral QAC breakfast  . multivitamin  1 tablet Oral QHS  . oxyCODONE  10 mg Oral BID  . vancomycin  1,000 mg Intravenous Q M,W,F-HD   aluminum hydroxide, feeding supplement (NEPRO CARB STEADY), guaiFENesin-dextromethorphan, lidocaine (PF), lidocaine-prilocaine, methocarbamol (ROBAXIN) IV, methocarbamol, metoCLOPramide (REGLAN) injection, metoCLOPramide, ondansetron (ZOFRAN) IV, ondansetron, oxyCODONE-acetaminophen, sorbitol, traZODone  Assessment/ Plan:   Assessment/ Plan:   1. L foot gangrene - s/p L BKA 5/1, rehab since 5/4. 2. MRSA bacteremia - on Vancomycin since 4/4; 8 weeks treatment 3. ESRD - HD on MWF @ [place].  4. HTN/Volume - BPs low side  BP 101/51  5. Anemia - Hgb 8.3 6. Sec HPT - Ca 9.5 (11.1 corrected), P 2.9; no Hectorol, 2Ca bath, Fosrenol with meals.  7. Nutrition - Alb 2.3, soft diet, vitamin.  8. Multiple myeloma - follow-up with Oncology in June.  9. PVD - s/p L BKA 05/14/13, R TMA 12/16/12  Plan dialysis today   LOS: [place] '@TODAY' '@11' :37 AM

## 2013-05-26 NOTE — Progress Notes (Signed)
Chart reviewed.  Agree with all interventions and documentation of intern.  Appropriate changes have been made and discussed with dietetic intern.   Guneet Delpino, MS RD LDN Clinical Inpatient Dietitian Pager: [phone] Weekend/After hours pager: [phone]   

## 2013-05-26 NOTE — Progress Notes (Signed)
Physical Therapy Note  Patient Details  Name: Jack [name] MRN: [id number] Date of Birth: [birth year] Today's Date: 05/26/2013  Time: 835-930 55 minutes  1:1 No c/o pain.  Treatment focused on family education with pt's daughter and brother.  Pt's daughter educated on and performed with min cuing for set up and sequencing: sliding board transfers to simulated bed height, car transfers with sliding board.  Pt able to perform at min A level, pt/daughter require cues to set up and safety each attempt.  Pt's brother educated on and performed car transfers with sliding board with brother needing min cuing for sequencing and safety as well.  Pt/family educated with handout on HEP for residual limb strengthening and ROM.  Emphasized positioning of residual limb and recommendation for prone lying to prevent contracture.   Danae Orleans Tearra Ouk 05/26/2013, 11:11 AM

## 2013-05-26 NOTE — Progress Notes (Signed)
[place] PHYSICAL MEDICINE & REHABILITATION     PROGRESS NOTE    Subjective/Complaints: Had a good night.---"best night of sleep yet" A 12 point review of systems has been performed and if not noted above is otherwise negative.   Objective: Vital Signs: Blood pressure 101/51, pulse 58, temperature 98.3 F (36.8 C), temperature source Oral, resp. rate 18, height 5\' 10"  (1.778 m), weight 73.8 kg (162 lb 11.2 oz), SpO2 100.00%. No results found.  Recent Labs  05/24/13 0800  WBC 7.7  HGB 8.3*  HCT 28.1*  PLT 287    Recent Labs  05/24/13 1300  NA 137  K 4.0  CL 96  GLUCOSE 207*  BUN 54*  CREATININE 7.30*  CALCIUM 9.5   CBG (last 3)  No results found for this basename: GLUCAP,  in the last 72 hours  Wt Readings from Last 3 Encounters:  05/26/13 73.8 kg (162 lb 11.2 oz)  05/17/13 67.7 kg (149 lb 4 oz)  05/17/13 67.7 kg (149 lb 4 oz)    Physical Exam:  Constitutional: He is oriented to person, place, and time.   HENT: oral mucosa pink/moist  Head: Normocephalic and atraumatic.  Eyes: Conjunctivae are normal.  Neck: Normal range of motion. Neck supple.  Cardiovascular: Normal rate and regular rhythm. No murmurs  Respiratory: Effort normal and breath sounds normal. No wheezes rales or rhonchi  GI: Soft. Bowel sounds are normal.  Musculoskeletal: He exhibits tenderness (left BKA site. )--tolerated dressing change.  Well healed right transmet site. Left hand with thenar atrophy and mild contracture.  Neurological: He is oriented to person, place, and time.  Appropriate affect.   Improving insight and awareness. Stocking-glove peripheral neuropathy. RLE weakness noted.  Skin: Skin is warm and dry. Wound clean and intact with only slight drainage. Area appropriately tender Psychiatric: His affect is blunt. He is withdrawn.    Assessment/Plan: 1. Functional deficits secondary to left BKA which require 3+ hours per day of interdisciplinary therapy in a comprehensive  inpatient rehab setting. Physiatrist is providing close team supervision and 24 hour management of active medical problems listed below. Physiatrist and rehab team continue to assess barriers to discharge/monitor patient progress toward functional and medical goals. FIM: FIM - Bathing Bathing Steps Patient Completed: Chest;Right Arm;Left Arm;Abdomen;Front perineal area;Buttocks;Right upper leg;Left upper leg;Right lower leg (including foot) Bathing: 5: Set-up assist to: Open items  FIM - Upper Body Dressing/Undressing Upper body dressing/undressing steps patient completed: Thread/unthread right sleeve of pullover shirt/dresss;Thread/unthread left sleeve of pullover shirt/dress;Put head through opening of pull over shirt/dress;Pull shirt over trunk Upper body dressing/undressing: 5: Set-up assist to: Obtain clothing/put away FIM - Lower Body Dressing/Undressing Lower body dressing/undressing steps patient completed: Thread/unthread right underwear leg;Thread/unthread left underwear leg;Pull underwear up/down;Thread/unthread right pants leg;Thread/unthread left pants leg;Pull pants up/down Lower body dressing/undressing: 4: Min-Patient completed 75 plus % of tasks  FIM - Toileting Toileting steps completed by patient: Adjust clothing prior to toileting;Performs perineal hygiene Toileting: 3: Mod-Patient completed 2 of 3 steps  FIM - Radio producer Devices: Recruitment consultant Transfers: 0-Activity did not occur  FIM - Control and instrumentation engineer Devices: Sliding board;Arm rests Bed/Chair Transfer: 5: Supine > Sit: Supervision (verbal cues/safety issues);4: Sit > Supine: Min A (steadying pt. > 75%/lift 1 leg)  FIM - Locomotion: Wheelchair Distance: 100 Locomotion: Wheelchair: 3: Travels 150 ft or more: maneuvers on rugs and over door sills with moderate assistance  (Pt: 50 - 74%) FIM - Locomotion: Ambulation Locomotion: Ambulation:  0:  Activity did not occur  Comprehension Comprehension Mode: Auditory Comprehension: 5-Understands complex 90% of the time/Cues < 10% of the time  Expression Expression Mode: Verbal Expression: 5-Expresses basic needs/ideas: With no assist  Social Interaction Social Interaction: 6-Interacts appropriately with others with medication or extra time (anti-anxiety, antidepressant).  Problem Solving Problem Solving: 3-Solves basic 50 - 74% of the time/requires cueing 25 - 49% of the time  Memory Memory: 3-Recognizes or recalls 50 - 74% of the time/requires cueing 25 - 49% of the time  Medical Problem List and Plan:  1. Functional deficits secondary to L-BKA   -prosthetic ed by [facility]  2. DVT Prophylaxis/Anticoagulation: Pharmaceutical: Lovenox  3. Pain Management: Reports pain with mobility and with dependency of LLE. Given ongoing cognitive deficits, I have decided to stop the gabapentin to see if this helps his cognition 4. Mood: continue egosupport. Reasonable spirits at present  5. Neuropsych: This patient is capable of making decisions on his own behalf.  6. ESRD: Continue HD on Mon, Wed, Fri after therapy day is completed 7. Hypotension: BP medications discontinued.    -encouraging po intake  -volume mgt per nephrology 8. Anemia of chronic disease: Continue aranesp. Routine check of H/H with HD to monitor for stability/trends.  9. MRSA bacteremia: IV vancomycin initiated 04/17/13--8 week duration  LOS (Days) 8 A FACE TO FACE EVALUATION WAS PERFORMED  Meredith Staggers 05/26/2013 7:43 AM

## 2013-05-27 ENCOUNTER — Inpatient Hospital Stay (HOSPITAL_COMMUNITY): Payer: Medicare Other | Admitting: Physical Therapy

## 2013-05-27 ENCOUNTER — Inpatient Hospital Stay (HOSPITAL_COMMUNITY): Payer: Medicare Other | Admitting: Occupational Therapy

## 2013-05-27 ENCOUNTER — Encounter (HOSPITAL_COMMUNITY): Payer: Medicare Other | Admitting: Occupational Therapy

## 2013-05-27 DIAGNOSIS — D631 Anemia in chronic kidney disease: Secondary | ICD-10-CM

## 2013-05-27 DIAGNOSIS — L98499 Non-pressure chronic ulcer of skin of other sites with unspecified severity: Secondary | ICD-10-CM

## 2013-05-27 DIAGNOSIS — N039 Chronic nephritic syndrome with unspecified morphologic changes: Secondary | ICD-10-CM

## 2013-05-27 DIAGNOSIS — S88119A Complete traumatic amputation at level between knee and ankle, unspecified lower leg, initial encounter: Secondary | ICD-10-CM

## 2013-05-27 DIAGNOSIS — I739 Peripheral vascular disease, unspecified: Secondary | ICD-10-CM

## 2013-05-27 DIAGNOSIS — N186 End stage renal disease: Secondary | ICD-10-CM

## 2013-05-27 DIAGNOSIS — I10 Essential (primary) hypertension: Secondary | ICD-10-CM

## 2013-05-27 MED ORDER — GABAPENTIN 100 MG PO CAPS
100.0000 mg | ORAL_CAPSULE | Freq: Every day | ORAL | Status: DC
  Administered 2013-05-27 – 2013-05-28 (×2): 100 mg via ORAL
  Filled 2013-05-27 (×3): qty 1

## 2013-05-27 MED ORDER — GABAPENTIN 600 MG PO TABS
300.0000 mg | ORAL_TABLET | Freq: Every day | ORAL | Status: DC
  Filled 2013-05-27: qty 0.5

## 2013-05-27 NOTE — Progress Notes (Signed)
Occupational Therapy Session Note  Patient Details  Name: Jack [name] MRN: [id number] Date of Birth: Jun 10, 1945  Today's Date: 05/27/2013 Time: 1062-6948 Time Calculation (min): 25 min (missed 20 min due to pain)  Short Term Goals: Week 1:  OT Short Term Goal 1 (Week 1): STG=LTG secondary to estimated SLOS  Skilled Therapeutic Interventions/Progress Updates:  Patient resting in bed upon arrival with RN in the room having just redressed/wrapped his residual limb.  Patient initally did not agree to therapy however, after encouragement he agreed.  Engaged in bed><drop arm commode transfer via slide board and min assist secondary to board sliding.  vcs for technique and hand placement so the board moves less.  Patient reports too much pain and requested back to bed.  Patient made comfortable and RN notified.  Therapy Documentation Precautions:  Precautions Precautions: Fall Precaution Comments: Lt BKA NWB; fell 05/19/13 attempting to get back in bed without assistance Required Braces or Orthoses: Other Brace/Splint Restrictions Weight Bearing Restrictions: Yes LLE Weight Bearing: Non weight bearing Other Position/Activity Restrictions: strict NWB LLE Pain: 6/10 residual limb before activity, 8/10 after commode transfer, rest, repositioned and RN notified. See FIM for current functional status  Therapy/Group: Individual Therapy  Gaye Pollack 05/27/2013, 1:28 PM

## 2013-05-27 NOTE — Progress Notes (Signed)
Social Work Patient ID: Jack [name], male   DOB: April 09, 1945, 68 y.o.   MRN: [id number]  CSW met with pt and spoke to pt's dtrs over the phone on 05-25-13 to discuss team conference and targeted d/c date.  Therapists feel family may need more time for family training and that 05-28-13 d/c may need to be 05-29-13.  CSW talked with brother and dtr to arrange family ed.  Met with family again today and they feel more training tomorrow and pt being d/c'd on Saturday would be helpful.  CSW to discuss this with therapy team and MD and make arrangements for more family ed tomorrow, if this is the new plan.  Home health is already arranged and DME ordered.  CSW to continue to follow and assist as needed.

## 2013-05-27 NOTE — Progress Notes (Addendum)
[place] PHYSICAL MEDICINE & REHABILITATION     PROGRESS NOTE    Subjective/Complaints: Mainly phantom limb pain, aware of D/C alert and appropriate  A 12 point review of systems has been performed and if not noted above is otherwise negative.   Objective: Vital Signs: Blood pressure 117/60, pulse 67, temperature 98.1 F (36.7 C), temperature source Oral, resp. rate 16, height 5\' 10"  (1.778 m), weight 70.4 kg (155 lb 3.3 oz), SpO2 100.00%. No results found.  Recent Labs  05/24/13 0800  WBC 7.7  HGB 8.3*  HCT 28.1*  PLT 287    Recent Labs  05/24/13 1300  NA 137  K 4.0  CL 96  GLUCOSE 207*  BUN 54*  CREATININE 7.30*  CALCIUM 9.5   CBG (last 3)  No results found for this basename: GLUCAP,  in the last 72 hours  Wt Readings from Last 3 Encounters:  05/27/13 70.4 kg (155 lb 3.3 oz)  05/17/13 67.7 kg (149 lb 4 oz)  05/17/13 67.7 kg (149 lb 4 oz)    Physical Exam:  Constitutional: He is oriented to person, place, and time.   HENT: oral mucosa pink/moist  Head: Normocephalic and atraumatic.  Eyes: Conjunctivae are normal.  Neck: Normal range of motion. Neck supple.  Cardiovascular: Normal rate and regular rhythm. No murmurs  Respiratory: Effort normal and breath sounds normal. No wheezes rales or rhonchi  GI: Soft. Bowel sounds are normal.  Musculoskeletal: He exhibits tenderness (left BKA site. )--tolerated dressing change.  Well healed right transmet site. Left hand with thenar atrophy and mild contracture.  Neurological: He is oriented to person, place, and time.  Appropriate affect.   Improving insight and awareness. Stocking-glove peripheral neuropathy. RLE weakness noted.  Skin: Skin is warm and dry. Wound clean and intact with only slight drainage. Area appropriately tender Psychiatric: His affect is blunt. He is withdrawn.    Assessment/Plan: 1. Functional deficits secondary to left BKA which require 3+ hours per day of interdisciplinary therapy in a  comprehensive inpatient rehab setting. Physiatrist is providing close team supervision and 24 hour management of active medical problems listed below. Physiatrist and rehab team continue to assess barriers to discharge/monitor patient progress toward functional and medical goals. FIM: FIM - Bathing Bathing Steps Patient Completed: Chest;Right Arm;Left Arm;Abdomen;Front perineal area;Buttocks;Right upper leg;Left upper leg;Right lower leg (including foot) Bathing: 5: Set-up assist to: Open items  FIM - Upper Body Dressing/Undressing Upper body dressing/undressing steps patient completed: Thread/unthread right sleeve of pullover shirt/dresss;Thread/unthread left sleeve of pullover shirt/dress;Put head through opening of pull over shirt/dress;Pull shirt over trunk Upper body dressing/undressing: 5: Set-up assist to: Obtain clothing/put away FIM - Lower Body Dressing/Undressing Lower body dressing/undressing steps patient completed: Thread/unthread right underwear leg;Thread/unthread left underwear leg;Pull underwear up/down;Thread/unthread right pants leg;Thread/unthread left pants leg;Pull pants up/down Lower body dressing/undressing: 4: Min-Patient completed 75 plus % of tasks  FIM - Toileting Toileting steps completed by patient: Adjust clothing prior to toileting;Performs perineal hygiene Toileting: 3: Mod-Patient completed 2 of 3 steps  FIM - Radio producer Devices: Research officer, trade union Transfers: 4-To toilet/BSC: Min A (steadying Pt. > 75%);4-From toilet/BSC: Min A (steadying Pt. > 75%)  FIM - Control and instrumentation engineer Devices: Sliding board;Arm rests Bed/Chair Transfer: 5: Supine > Sit: Supervision (verbal cues/safety issues);5: Sit > Supine: Supervision (verbal cues/safety issues);4: Chair or W/C > Bed: Min A (steadying Pt. > 75%);4: Bed > Chair or W/C: Min A (steadying Pt. > 75%)  FIM -  Locomotion:  Wheelchair Distance: 100 Locomotion: Wheelchair: 2: Travels 50 - 149 ft with minimal assistance (Pt.>75%) FIM - Locomotion: Ambulation Locomotion: Ambulation: 0: Activity did not occur  Comprehension Comprehension Mode: Auditory Comprehension: 5-Understands complex 90% of the time/Cues < 10% of the time  Expression Expression Mode: Verbal Expression: 5-Expresses basic needs/ideas: With no assist  Social Interaction Social Interaction: 6-Interacts appropriately with others with medication or extra time (anti-anxiety, antidepressant).  Problem Solving Problem Solving: 3-Solves basic 50 - 74% of the time/requires cueing 25 - 49% of the time  Memory Memory: 3-Recognizes or recalls 50 - 74% of the time/requires cueing 25 - 49% of the time  Medical Problem List and Plan:  1. Functional deficits secondary to L-BKA   -prosthetic ed by [facility]  2. DVT Prophylaxis/Anticoagulation: Pharmaceutical: Lovenox  3. Pain Management: Reports pain with mobility and with dependency of LLE.Restart Gabapentin low dose qhs opnly for phantom pain    4. Mood: continue egosupport. Reasonable spirits at present  5. Neuropsych: This patient is capable of making decisions on his own behalf.  6. ESRD: Continue HD on Mon, Wed, Fri after therapy day is completed 7. Hypotension: BP medications discontinued.    -encouraging po intake  -volume mgt per nephrology 8. Anemia of chronic disease: Continue aranesp. Routine check of H/H with HD to monitor for stability/trends.  9. MRSA bacteremia: IV vancomycin initiated 04/17/13--8 week duration  LOS (Days) 9 A FACE TO FACE EVALUATION WAS PERFORMED  Charlett Blake 05/27/2013 6:07 AM

## 2013-05-27 NOTE — Progress Notes (Signed)
Recreational Therapy Discharge Summary Patient Details  Name: Jack [name] MRN: [id number] Date of Birth: August 25, 1945 Today's Date: 05/27/2013  Long term goals set: 1  Long term goals met: 1  Comments on progress toward goals: Pt is scheduled for discharge home tomorrow with family to provide/coordinate 24 hour supervision/assist.  Pt requires verbal cuing for safety, but is able to perform simple familiar TR tasks seated w/c level with set up assist & supervision for safety. Reasons for discharge: discharge from hospital Patient/family agrees with progress made and goals achieved: Yes  Waldon Reining 05/27/2013, 3:52 PM

## 2013-05-27 NOTE — Progress Notes (Signed)
[facility] ROUNDING NOTE   Subjective:   Interval History: hoping to go home tomorrow   Objective:  Vital signs in last 24 hours:  Temp:  [98 F (36.7 C)-98.7 F (37.1 C)] 98.1 F (36.7 C) (05/14 0605) Pulse Rate:  [67-107] 67 (05/14 0605) Resp:  [16-18] 16 (05/14 0605) BP: (96-136)/(35-79) 117/60 mmHg (05/14 0605) SpO2:  [96 %-100 %] 100 % (05/14 0605) Weight:  [69.5 kg (153 lb 3.5 oz)-72.6 kg (160 lb 0.9 oz)] 70.4 kg (155 lb 3.3 oz) (05/14 0017)  Weight change: -1.2 kg (-2 lb 10.3 oz) Filed Weights   05/26/13 1610 05/26/13 2021 05/27/13 0605  Weight: 72.6 kg (160 lb 0.9 oz) 69.5 kg (153 lb 3.5 oz) 70.4 kg (155 lb 3.3 oz)    Intake/Output: I/O last 3 completed shifts: In: 37 [P.O.:720] Out: 3000 [Other:3000]   Intake/Output this shift:  Total I/O In: 240 [P.O.:240] Out: -   CVS- RRR  RS- CTA  ABD- BS present soft non-distended  EXT- Extremities: R TMA well healed, L BKA wrapped, no edema  Access: AVF @ LUA with + bruit    Basic Metabolic Panel:  Recent Labs Lab 05/21/13 1538 05/24/13 1300  NA 139 137  K 4.0 4.0  CL 94* 96  CO2 28 27  GLUCOSE 207* 207*  BUN 30* 54*  CREATININE 5.60* 7.30*  CALCIUM 9.7 9.5  PHOS 2.9 3.0    Liver Function Tests:  Recent Labs Lab 05/21/13 1538 05/24/13 1300  ALBUMIN 2.3* 2.3*   No results found for this basename: LIPASE, AMYLASE,  in the last 168 hours No results found for this basename: AMMONIA,  in the last 168 hours  CBC:  Recent Labs Lab 05/21/13 1538 05/24/13 0800  WBC 8.7 7.7  HGB 9.3* 8.3*  HCT 31.4* 28.1*  MCV 87.7 88.4  PLT 316 287    Cardiac Enzymes: No results found for this basename: CKTOTAL, CKMB, CKMBINDEX, TROPONINI,  in the last 168 hours  BNP: No components found with this basename: POCBNP,   CBG:  Recent Labs Lab 05/21/13 0720 05/21/13 1200 05/21/13 2017 05/22/13 0725 05/22/13 1117  GLUCAP 146* 116* 151* 125* 153*    Microbiology: Results for orders  placed during the hospital encounter of 05/01/13  CULTURE, BLOOD (ROUTINE X 2)     Status: None   Collection Time    05/01/13  8:50 PM      Result Value Ref Range Status   Specimen Description BLOOD RIGHT ARM   Final   Special Requests BOTTLES DRAWN AEROBIC AND ANAEROBIC 10CC EA   Final   Culture  Setup Time     Final   Value: 05/02/2013 02:24     Performed at [facility]   Culture     Final   Value: NO GROWTH 5 DAYS     Performed at [facility]   Report Status 05/08/2013 FINAL   Final  CULTURE, BLOOD (ROUTINE X 2)     Status: None   Collection Time    05/01/13  9:00 PM      Result Value Ref Range Status   Specimen Description BLOOD RIGHT HAND   Final   Special Requests BOTTLES DRAWN AEROBIC ONLY [facility]   Final   Culture  Setup Time     Final   Value: 05/02/2013 02:24     Performed at [facility]   Culture     Final   Value: NO GROWTH 5 DAYS  Performed at [facility]   Report Status 05/08/2013 FINAL   Final  MRSA PCR SCREENING     Status: None   Collection Time    05/02/13  1:08 AM      Result Value Ref Range Status   MRSA by PCR NEGATIVE  NEGATIVE Final   Comment:            The GeneXpert MRSA Assay (FDA     approved for NASAL specimens     only), is one component of a     comprehensive MRSA colonization     surveillance program. It is not     intended to diagnose MRSA     infection nor to guide or     monitor treatment for     MRSA infections.    Coagulation Studies: No results found for this basename: LABPROT, INR,  in the last 72 hours  Urinalysis: No results found for this basename: COLORURINE, APPERANCEUR, LABSPEC, PHURINE, GLUCOSEU, HGBUR, BILIRUBINUR, KETONESUR, PROTEINUR, UROBILINOGEN, NITRITE, LEUKOCYTESUR,  in the last 72 hours    Imaging: No results found.   Medications:     . colchicine  0.3 mg Oral Daily  . darbepoetin (ARANESP) injection - DIALYSIS  60 mcg Intravenous Q Fri-HD  . enoxaparin (LOVENOX)  injection  30 mg Subcutaneous Q24H  . feeding supplement (PRO-STAT SUGAR FREE 64)  30 mL Oral TID WC  . gabapentin  100 mg Oral QHS  . heparin  20 Units/kg Dialysis Once in dialysis  . lanthanum  1,000 mg Oral TID WC  . levothyroxine  175 mcg Oral QAC breakfast  . multivitamin  1 tablet Oral QHS  . oxyCODONE  10 mg Oral BID  . senna-docusate  2 tablet Oral QHS  . vancomycin  1,000 mg Intravenous Q M,W,F-HD   aluminum hydroxide, bisacodyl, feeding supplement (NEPRO CARB STEADY), guaiFENesin-dextromethorphan, lidocaine (PF), lidocaine-prilocaine, methocarbamol (ROBAXIN) IV, methocarbamol, metoCLOPramide (REGLAN) injection, metoCLOPramide, ondansetron (ZOFRAN) IV, ondansetron, oxyCODONE-acetaminophen, sodium phosphate, sorbitol, traZODone  Assessment/ Plan:  1. L foot gangrene - s/p L BKA 5/1, rehab since 5/4. 2. MRSA bacteremia - on Vancomycin since 4/4; 8 weeks treatment 3. ESRD - HD on MWF @ [place].  4. HTN/Volume - BPs better 5. Anemia - Hgb 8.3 6. Sec HPT - Ca 9.5 (11.1 corrected), P 2.9; no Hectorol, 2Ca bath, Fosrenol with meals.  7. Nutrition - Alb 2.3, soft diet, vitamin.  8. Multiple myeloma - follow-up with Oncology in June.  9. PVD - s/p L BKA 05/14/13, R TMA 12/16/12  Plan dialysis tomorrow    LOS: [place] _0 _1 :48 AM

## 2013-05-27 NOTE — Progress Notes (Signed)
Occupational Therapy Session Note  Patient Details  Name: Jack [name] MRN: [id number] Date of Birth: 04/07/1945  Today's Date: 05/27/2013 Time: 1000-1058 Time Calculation (min): 58 min  Skilled Therapeutic Interventions/Progress Updates:    Pt was seen today for individual OT treatment session involving ADL retraining, pt/family education KD:[id number] board transfers from bed to drop arm 3:1 and bed to w/c. Pt's daughter was present throughout session. Pt was min A sliding board transfers with his daughter however, pt/daughter required vc's for positioning during transfers w/ sliding board. Pt attempted to reach awkwardly and wanted to "pull" up on his daughter requesting increased assistance from her that he does not request/require during therapist transfers. Given initial min A & vc's for safety and technique, pt/daughter was able to assist with transfers. Discussed the benefits of further family education with pt & his daughter for increased safety and independence with sliding board transfers, both verbalized understanding of this and plan to d/c Sat (05/29/13 instead of 05/28/13) if possible.  Therapy Documentation Precautions:  Precautions Precautions: Fall Precaution Comments: Lt BKA NWB; fell 05/19/13 attempting to get back in bed without assistance Required Braces or Orthoses: Other Brace/Splint Restrictions Weight Bearing Restrictions: Yes LLE Weight Bearing: Non weight bearing Other Position/Activity Restrictions: strict NWB LLE     Pain: Pt denies pain, reports c/o constipation. RN made aware and to address.   ADL: ADL ADL Comments: Refer to FIM     See FIM for current functional status  Therapy/Group: Individual Therapy  Shabrea Weldin B Bellanie Matthew 05/27/2013, 12:33 PM

## 2013-05-27 NOTE — Patient Care Conference (Signed)
Inpatient RehabilitationTeam Conference and Plan of Care Update Date: 05/25/2013   Time: 2:15 PM    Patient Name: Jack [name]      Medical Record Number: [id number]  Date of Birth: 07/14/1945 Sex: Male         Room/Bed: 4M01C/4M01C-01 Payor Info: Payor: Marine scientist / Plan: AARP MEDICARE COMPLETE / Product Type: *No Product type* /    Admitting Diagnosis: L BKA  Admit Date/Time:  05/18/2013  5:45 PM Admission Comments: No comment available   Primary Diagnosis:  <principal problem not specified> Principal Problem: <principal problem not specified>  Patient Active Problem List   Diagnosis Date Noted  . BKA stump complication 70/35/0093  . Asthma 05/17/2013  . S/P BKA left leg  05/17/2013  . FTT (failure to thrive) in adult 05/01/2013  . History of MRSA infection 04/22/2013  . Peripheral vascular disease, unspecified 11/19/2012  . End-stage renal disease on hemodialysis 05/05/2012  . Anemia in chronic kidney disease 03/19/2012  . Iron deficiency 03/19/2012  . Hypothyroidism 03/19/2012  . Hypertension 03/18/2012  . MGUS (monoclonal gammopathy of unknown significance) 02/28/2011    Expected Discharge Date: Expected Discharge Date: 05/28/13  Team Members Present: Physician leading conference: Dr. Alger Simons Social Worker Present: Alfonse Alpers, LCSW Nurse Present: Elliot Cousin, RN PT Present: Canary Brim, PT OT Present: Chrys Racer, Artemio Aly, [place];Blanchard Mane, OT SLP Present: Weston Anna, SLP Other (Discipline and Name): Danne Baxter, RN [facility])     Current Status/Progress Goal Weekly Team Focus  Medical   left bka, cognitive issues. pain. wound healing nicely  maximiize medical mgt and functional mobility  cognition, pain, wound   Bowel/Bladder   Incontinent of bowel and anuria for the bladder. LBM 05/22/13  To have a regular bowel movement and to be continent of bowel .  Monitor for s/s of diarrhea/constipation.    Swallow/Nutrition/ Hydration             ADL's   set up/supervision bath and dress at bedside with lateral leans, min assist drop arm commode transfers, mod assist with toileting  supervision goals  activity tolerance, toilet transfers and toileting with lateral leans, pt/caregiver education   Mobility   min A  min A  family ed   Communication             Safety/Cognition/ Behavioral Observations            Pain   c/o pain to right BKA, PRN Percocet 5/325 mg 1-2 tablets q 4 hrs./ Scheduled Oxycodone IR 10 mg twice a day.  Pain level 5 or less on a scale of 0-10  Monitor any onset of pain.   Skin   R-BKA with surgical and coban dressing,  L foot transmetatarsal amputation with allevyn dressing /unstageable, left buttocks, sacrum, coccyx  stage 2 with allevyn dressing  No new breakdown/infection  Assess skin q shift    Rehab Goals Patient on target to meet rehab goals: Yes Rehab Goals Revised: Some w/c goals have been changed. *See Care Plan and progress notes for long and short-term goals.  Barriers to Discharge: pain, cogntiion    Possible Resolutions to Barriers:  supervision at home, adjusting medications    Discharge Planning/Teaching Needs:  Pt will be going to his home to the care of his dtrs and brother.  Proper nutrition due to poor appetite. Family has been participating with family education from therapists.   Team Discussion:  Left BKA is looking good and pt's pain is better  controlloed.  Pt's memory is poor and his cognition impacts his progress in therapy.  Family will need lots of family education and CSW will coordinate this.  Pt has dialysis M, W, and F.  Pt will need a drop arm commode.  Revisions to Treatment Plan:  None   Continued Need for Acute Rehabilitation Level of Care: The patient requires daily medical management by a physician with specialized training in physical medicine and rehabilitation for the following conditions: Daily direction of a  multidisciplinary physical rehabilitation program to ensure safe treatment while eliciting the highest outcome that is of practical value to the patient.: Yes Daily medical management of patient stability for increased activity during participation in an intensive rehabilitation regime.: Yes Daily analysis of laboratory values and/or radiology reports with any subsequent need for medication adjustment of medical intervention for : Neurological problems;Post surgical problems  Silvestre Mesi Renold Kozar 05/27/2013, 1:45 PM

## 2013-05-27 NOTE — Progress Notes (Signed)
Physical Therapy Note  Patient Details  Name: CHAYTON MURATA MRN: [id number] Date of Birth: 04/18/1945 Today's Date: 05/27/2013  Time: 730-825 55 minutes  1:1 No c/o pain.  Treatment focused on continued family education with pt/daugther.  Pt's daughter able to demo x 5 attempts sliding board transfer bed <> w/c providing min A for pt.  Pt's daugther able to provide appropriate cues to assist pt.  Pt propelled w/c in controlled and home environments with min A, daugther able to set up w/c safely for transfers without cuing.  Pt/daughter educated on supine/sidelying/prone therex program, pt's daughter expresses understanding.  Pt/daughter demo and verbalize safe sliding board transfers and both express they feel comfortable with d/c home at this level of care.   Danae Orleans Nolah Krenzer 05/27/2013, 8:25 AM

## 2013-05-27 NOTE — Progress Notes (Signed)
Social Work Discharge Note  The overall goal for the admission was met for:   Discharge location: Yes - home with family  Length of Stay: Yes - 11 days  Discharge activity level: Yes - supervision with some minimum assistance  Home/community participation: Yes  Services provided included: MD, RD, PT, OT, TR, Pharmacy and [place]: Private Insurance: Retail buyer [facility] Medicare)  Follow-up services arranged: Home Health: PT, OT, DME: shower seat, drop arm commode, 16x16 wheelchair with left amputee support pad and basic cushion and Patient/Family request agency HH: [facility], DME: [facility]  Comments (or additional information):  Patient/Family verbalized understanding of follow-up arrangements: Yes  Individual responsible for coordination of the follow-up plan: Pt's daughters will direct and coordinate care.  Pt's brother will transport him to and from dialysis.  Pt will have some responsibility, as well.  Confirmed correct DME delivered: Jack [name] 05/27/2013    Jack [name]

## 2013-05-27 NOTE — Plan of Care (Signed)
Problem: RH BOWEL ELIMINATION Goal: RH STG MANAGE BOWEL WITH ASSISTANCE STG Manage Bowel with Assistance. Mod A  Outcome: Not Progressing Needed suppository with total assist. Started on QHS Senna S.

## 2013-05-27 NOTE — Progress Notes (Signed)
Physical Therapy Session Note  Patient Details  Name: CLEATUS GABRIEL MRN: [id number] Date of Birth: [birth year]  Today's Date: 05/27/2013 Time: 2633-3545 Time Calculation (min): 30 min  Short Term Goals: Week 1:  PT Short Term Goal 1 (Week 1): = LTGs due to short LOS  Skilled Therapeutic Interventions/Progress Updates:   Pt received sleeping in bed, easily aroused. Pt rubbing residual limb and reporting 6/10 pain, stating he has not received anything for pain since yesterday AM. RN notified and informed patient he recently received pain medication approx 1 hour prior to therapy. Sit <> supine with supervision and HOB flat. SB transfer bed > w/c and w/c <> mat with min assist/setup for board placement. W/c propulsion x 50 ft using BUEs and min assist. For LLE strengthening/ROM: supine SLR, bridging over bolster, SAQ over bolster, sidelying hip flex/ext with assist to perform correctly, x 15 each exercise. Pt returned to room and left sitting in w/c with quick release belt donned and all needs within reach.    Therapy Documentation Precautions:  Precautions Precautions: Fall Precaution Comments: Lt BKA NWB; fell 05/19/13 attempting to get back in bed without assistance Required Braces or Orthoses: Other Brace/Splint Restrictions Weight Bearing Restrictions: Yes LLE Weight Bearing: Non weight bearing Other Position/Activity Restrictions: strict NWB LLE   See FIM for current functional status  Therapy/Group: Individual Therapy  Laretta Alstrom 05/27/2013, 4:23 PM

## 2013-05-28 ENCOUNTER — Inpatient Hospital Stay (HOSPITAL_COMMUNITY): Payer: Medicare Other | Admitting: Occupational Therapy

## 2013-05-28 ENCOUNTER — Encounter (HOSPITAL_COMMUNITY): Payer: Medicare Other | Admitting: Occupational Therapy

## 2013-05-28 ENCOUNTER — Ambulatory Visit (HOSPITAL_COMMUNITY): Payer: Medicare Other | Admitting: Physical Therapy

## 2013-05-28 LAB — RENAL FUNCTION PANEL
Albumin: 2.2 g/dL — ABNORMAL LOW (ref 3.5–5.2)
BUN: 36 mg/dL — ABNORMAL HIGH (ref 6–23)
CHLORIDE: 97 meq/L (ref 96–112)
CO2: 28 mEq/L (ref 19–32)
Calcium: 9.1 mg/dL (ref 8.4–10.5)
Creatinine, Ser: 4.98 mg/dL — ABNORMAL HIGH (ref 0.50–1.35)
GFR calc Af Amer: 12 mL/min — ABNORMAL LOW (ref >90)
GFR calc non Af Amer: 11 mL/min — ABNORMAL LOW (ref >90)
GLUCOSE: 118 mg/dL — AB (ref 70–99)
PHOSPHORUS: 3.2 mg/dL (ref 2.3–4.6)
POTASSIUM: 3.5 meq/L — AB (ref 3.7–5.3)
Sodium: 135 mEq/L — ABNORMAL LOW (ref 137–147)

## 2013-05-28 LAB — CBC
HCT: 25.6 % — ABNORMAL LOW (ref 39.0–52.0)
Hemoglobin: 7.7 g/dL — ABNORMAL LOW (ref 13.0–17.0)
MCH: 26.6 pg (ref 26.0–34.0)
MCHC: 30.1 g/dL (ref 30.0–36.0)
MCV: 88.3 fL (ref 78.0–100.0)
Platelets: 257 10*3/uL (ref 150–400)
RBC: 2.9 MIL/uL — ABNORMAL LOW (ref 4.22–5.81)
RDW: 16.9 % — ABNORMAL HIGH (ref 11.5–15.5)
WBC: 6 10*3/uL (ref 4.0–10.5)

## 2013-05-28 LAB — VANCOMYCIN, RANDOM: VANCOMYCIN RM: 30 ug/mL

## 2013-05-28 MED ORDER — OXYCODONE HCL 5 MG PO TABS: 5.0000 mg | ORAL_TABLET | Freq: Four times a day (QID) | ORAL | Status: DC | PRN

## 2013-05-28 MED ORDER — GABAPENTIN 100 MG PO CAPS: 100.0000 mg | ORAL_CAPSULE | Freq: Every day | ORAL | Status: DC

## 2013-05-28 MED ORDER — VANCOMYCIN HCL IN DEXTROSE 1-5 GM/200ML-% IV SOLN: 1000.0000 mg | INTRAVENOUS | Status: DC

## 2013-05-28 MED ORDER — SENNOSIDES-DOCUSATE SODIUM 8.6-50 MG PO TABS: 2.0000 | ORAL_TABLET | Freq: Every day | ORAL | Status: DC

## 2013-05-28 MED ORDER — DARBEPOETIN ALFA-POLYSORBATE 40 MCG/0.4ML IJ SOLN
INTRAMUSCULAR | Status: AC
  Filled 2013-05-28: qty 0.4

## 2013-05-28 MED ORDER — LANTHANUM CARBONATE 1000 MG PO CHEW: 1000.0000 mg | CHEWABLE_TABLET | Freq: Three times a day (TID) | ORAL | Status: DC

## 2013-05-28 MED ORDER — OXYCODONE HCL 10 MG PO TABS: 5.0000 mg | ORAL_TABLET | Freq: Four times a day (QID) | ORAL | Status: DC | PRN

## 2013-05-28 MED ORDER — DARBEPOETIN ALFA-POLYSORBATE 60 MCG/0.3ML IJ SOLN
INTRAMUSCULAR | Status: AC
  Filled 2013-05-28: qty 0.3

## 2013-05-28 NOTE — Discharge Instructions (Signed)
Inpatient Rehab Discharge Instructions  Jack [name] Discharge date and time:  05/29/13  Activities/Precautions/ Functional Status: Activity: activity as tolerated with assistance Diet: diabetic diet Limit fluids to 5 cups daily.  Wound Care: keep wound clean and dry  Functional status:  ___ No restrictions     ___ Walk up steps independently _X__ 24/7 supervision/assistance   ___ Walk up steps with assistance ___ Intermittent supervision/assistance  ___ Bathe/dress independently ___ Walk with walker     _X__ Bathe/dress with assistance ___ Walk Independently    ___ Shower independently ___ Walk with assistance    ___ Shower with assistance _X__ No alcohol     ___ Return to work/school ________  COMMUNITY REFERRALS UPON DISCHARGE:   Home Health:   PT     OT     RN      Agency:  [facility] Phone:  [phone] Medical Equipment/Items Ordered:  16x16 lightweight w/c with left amputee pad and basic cushion; long sliding board; drop arm commode  Agency/Supplier:   [facility]      Phone:  [place] RESOURCES FOR PATIENT/FAMILY: Support Groups:  [facility] Amputee Support Group                                  [facility]  [phone]  Special Instructions:    My questions have been answered and I understand these instructions. I will adhere to these goals and the provided educational materials after my discharge from the hospital.  Patient/Caregiver Signature _______________________________ Date __________  Clinician Signature _______________________________________ Date __________  Please bring this form and your medication list with you to all your follow-up doctor's appointments.

## 2013-05-28 NOTE — Progress Notes (Signed)
Occupational Therapy Session Note  Patient Details  Name: Jack [name] MRN: [id number] Date of Birth: Oct 17, 1945  Today's Date: 05/28/2013 Time: 1435-1500 Time Calculation (min): 25 min  Short Term Goals: Week 1:  OT Short Term Goal 1 (Week 1): STG=LTG secondary to estimated SLOS  Skilled Therapeutic Interventions/Progress Updates:  Patient received supine in bed. Patient reluctant to work with therapist, stating "I'm supposed to be finished with therapy". With moderate encouragement, patient willing. Patient sat EOB for UB dressing, then completed LB dressing in supine; patient supervision for UB/LB dressing. Patient took ~5 minute break per his request before proceeding with session. Patient sat EOB with supervision, donned shoe, and transferred EOB>w/c using slide board with minimal assistance. At end of session, left patient seated in w/c with quick release belt donned and all needs within reach.   Precautions:  Precautions Precautions: Fall Precaution Comments: Lt BKA NWB; fell 05/19/13 attempting to get back in bed without assistance Required Braces or Orthoses: Other Brace/Splint Restrictions Weight Bearing Restrictions: Yes LLE Weight Bearing: Non weight bearing Other Position/Activity Restrictions: strict NWB LLE  See FIM for current functional status  Therapy/Group: Individual Therapy  Estelle June 05/28/2013, 3:23 PM

## 2013-05-28 NOTE — Progress Notes (Addendum)
Occupational Therapy Cancellation Note  Patient Details  Name: Jack [name] MRN: [id number] Date of Birth: [birth year] Today's Date: 05/28/2013  Patient missed 60 minutes of OT session this AM for family education. Patient receiving dialysis at time of scheduled therapy.   Blanchard Mane, MS, OTR/L Occupational Therapist   05/28/2013, 1:45 PM

## 2013-05-28 NOTE — Progress Notes (Signed)
Occupational Therapy Cancellation Note  Patient Details  Name: Jack [name] MRN: [id number] Date of Birth: [birth year] Today's Date: 05/28/2013  Patient missed 45 minutes of OT session this AM for family education. Patient receiving dialysis at time of scheduled therapy.   Ailene Ravel, OTR/L,CBIS   05/28/2013, 10:39 AM

## 2013-05-28 NOTE — Progress Notes (Signed)
Physical Therapy Discharge Summary  Patient Details  Name: Jack [name] MRN: [id number] Date of Birth: 06-29-1945  Today's Date: 05/28/2013  Patient has met 6 of 6 long term goals due to improved activity tolerance, improved balance, increased strength and ability to compensate for deficits.  Patient to discharge at a wheelchair level [place].   Patient's care partner is independent to provide the necessary physical assistance at discharge.  Reasons goals not met: n/a  Recommendation:  Patient will benefit from ongoing skilled PT services in home health setting to continue to advance safe functional mobility, address ongoing impairments in strength, mobility, balance, and minimize fall risk.  Equipment: sliding board, w/c  Reasons for discharge: treatment goals met and discharge from hospital  Patient/family agrees with progress made and goals achieved: Yes  PT Discharge Precautions/Restrictions Precautions Precautions: Fall Restrictions LLE Weight Bearing: Non weight bearing   Cognition Orientation Level: Oriented to person;Oriented to place;Oriented to situation Memory: Impaired Problem Solving: Impaired Problem Solving Impairment: Functional complex Sensation Sensation Light Touch: Impaired Detail Light Touch Impaired Details: Impaired RLE Proprioception: Appears Intact Additional Comments: bil fingertips "numb" Coordination Gross Motor Movements are Fluid and Coordinated: Yes Fine Motor Movements are Fluid and Coordinated: Yes Motor  Motor Motor - Skilled Clinical Observations: Generalized muscle weakness   Trunk/Postural Assessment  Cervical Assessment Cervical Assessment: Within Functional Limits Thoracic Assessment Thoracic Assessment: Within Functional Limits Lumbar Assessment Lumbar Assessment: Within Functional Limits Postural Control Postural Control: Within Functional Limits  Balance Dynamic Sitting Balance Dynamic Sitting - Level of  Assistance: 5: Stand by assistance Extremity Assessment      RLE Strength RLE Overall Strength Comments: decreased ankle ROM, generalized weakness, grossly 3/5 LLE PROM (degrees) Left Knee Extension: lacks 20 degrees knee ext, grossly 3-/5  See FIM for current functional status  [name] 05/28/2013, 8:50 AM

## 2013-05-28 NOTE — Progress Notes (Signed)
[place] PHYSICAL MEDICINE & REHABILITATION     PROGRESS NOTE    Subjective/Complaints: No phantom limb pain, slept well.  Good BM last noc A 12 point review of systems has been performed and if not noted above is otherwise negative.   Objective: Vital Signs: Blood pressure 115/59, pulse 79, temperature 98.6 F (37 C), temperature source Oral, resp. rate 16, height 5\' 10"  (1.778 m), weight 70.4 kg (155 lb 3.3 oz), SpO2 100.00%. No results found. No results found for this basename: WBC, HGB, HCT, PLT,  in the last 72 hours No results found for this basename: NA, K, CL, CO, GLUCOSE, BUN, CREATININE, CALCIUM,  in the last 72 hours CBG (last 3)  No results found for this basename: GLUCAP,  in the last 72 hours  Wt Readings from Last 3 Encounters:  05/27/13 70.4 kg (155 lb 3.3 oz)  05/17/13 67.7 kg (149 lb 4 oz)  05/17/13 67.7 kg (149 lb 4 oz)    Physical Exam:  Constitutional: He is oriented to person, place, and time.   HENT: oral mucosa pink/moist  Head: Normocephalic and atraumatic.  Eyes: Conjunctivae are normal.  Neck: Normal range of motion. Neck supple.  Cardiovascular: Normal rate and regular rhythm. No murmurs  Respiratory: Effort normal and breath sounds normal. No wheezes rales or rhonchi  GI: Soft. Bowel sounds are normal.  Musculoskeletal: He exhibits tenderness (left BKA site. )--tolerated dressing change.  Well healed right transmet site. Left hand with thenar atrophy and mild contracture.  Neurological: He is oriented to person, place, and time.  Appropriate affect.   Improving insight and awareness. Stocking-glove peripheral neuropathy. RLE weakness noted.  Skin: Skin is warm and dry. Wound clean and intact with only slight drainage. Area appropriately tender Psychiatric: His affect is blunt. He is withdrawn.    Assessment/Plan: 1. Functional deficits secondary to left BKA which require 3+ hours per day of interdisciplinary therapy in a comprehensive  inpatient rehab setting. Physiatrist is providing close team supervision and 24 hour management of active medical problems listed below. Physiatrist and rehab team continue to assess barriers to discharge/monitor patient progress toward functional and medical goals. Plan D/C in am Instructions for pt and family today  FIM: FIM - Bathing Bathing Steps Patient Completed: Chest;Right Arm;Left Arm;Abdomen;Front perineal area;Buttocks;Right upper leg;Left upper leg;Right lower leg (including foot) Bathing: 5: Set-up assist to: Open items  FIM - Upper Body Dressing/Undressing Upper body dressing/undressing steps patient completed: Thread/unthread right sleeve of pullover shirt/dresss;Thread/unthread left sleeve of pullover shirt/dress;Put head through opening of pull over shirt/dress;Pull shirt over trunk Upper body dressing/undressing: 5: Set-up assist to: Obtain clothing/put away FIM - Lower Body Dressing/Undressing Lower body dressing/undressing steps patient completed: Thread/unthread right underwear leg;Thread/unthread left underwear leg;Pull underwear up/down;Thread/unthread right pants leg;Thread/unthread left pants leg;Pull pants up/down Lower body dressing/undressing: 5: Set-up assist to: Obtain clothing (Pt dons LB clothing supine in bed)  FIM - Toileting Toileting steps completed by patient: Adjust clothing prior to toileting;Performs perineal hygiene Toileting: 3: Mod-Patient completed 2 of 3 steps  FIM - Radio producer Devices: Research officer, trade union Transfers: 4-To toilet/BSC: Min A (steadying Pt. > 75%);4-From toilet/BSC: Min A (steadying Pt. > 75%)  FIM - Control and instrumentation engineer Devices: Sliding board;Arm rests Bed/Chair Transfer: 5: Supine > Sit: Supervision (verbal cues/safety issues);5: Sit > Supine: Supervision (verbal cues/safety issues);4: Bed > Chair or W/C: Min A (steadying Pt. > 75%);4: Chair or W/C >  Bed: Min A (steadying Pt. >  75%)  FIM - Locomotion: Wheelchair Distance: 50 Locomotion: Wheelchair: 2: Travels 50 - 149 ft with minimal assistance (Pt.>75%) FIM - Locomotion: Ambulation Locomotion: Ambulation: 0: Activity did not occur  Comprehension Comprehension Mode: Auditory Comprehension: 5-Understands complex 90% of the time/Cues < 10% of the time  Expression Expression Mode: Verbal Expression: 5-Expresses complex 90% of the time/cues < 10% of the time  Social Interaction Social Interaction: 6-Interacts appropriately with others with medication or extra time (anti-anxiety, antidepressant).  Problem Solving Problem Solving: 3-Solves basic 50 - 74% of the time/requires cueing 25 - 49% of the time  Memory Memory: 3-Recognizes or recalls 50 - 74% of the time/requires cueing 25 - 49% of the time  Medical Problem List and Plan:  1. Functional deficits secondary to L-BKA   -prosthetic ed by [facility]  2. DVT Prophylaxis/Anticoagulation: Pharmaceutical: Lovenox  3. Pain Management: Reports pain with mobility and with dependency of LLE.Restart Gabapentin low dose qhs opnly for phantom pain    4. Mood: continue egosupport. Reasonable spirits at present  5. Neuropsych: This patient is capable of making decisions on his own behalf.  6. ESRD: Continue HD on Mon, Wed, Fri after therapy day is completed 7. Hypotension: BP medications discontinued.    -encouraging po intake  -volume mgt per nephrology 8. Anemia of chronic disease: Continue aranesp. Routine check of H/H with HD to monitor for stability/trends.  9. MRSA bacteremia: IV vancomycin initiated 04/17/13--8 week duration  LOS (Days) 10 A FACE TO FACE EVALUATION WAS PERFORMED  Charlett Blake 05/28/2013 6:14 AM

## 2013-05-28 NOTE — Procedures (Signed)
I have seen and examined this patient and agree with the plan of care. Seen on dialysis appears to be doing well, home after dialysis Sherril Croon 05/28/2013, 8:44 AM

## 2013-05-28 NOTE — Progress Notes (Signed)
Physical Therapy Note  Patient Details  Name: Jack [name] MRN: [id number] Date of Birth: May 01, 1945 Today's Date: 05/28/2013  Time: 30  Pt missed 60 minute skilled PT session due to off unit for dialysis   [name] 05/28/2013, 7:44 AM

## 2013-05-28 NOTE — Progress Notes (Addendum)
Occupational Therapy Discharge Summary  Patient Details  Name: Jack [name] MRN: [id number] Date of Birth: 03/15/1945  Today's Date: 05/28/2013  Patient has met 4 of 4 long term goals due to improved activity tolerance, improved balance and improved strength.  Patient to discharge at overall Supervision- min assist level.  Patient's daughter, Cori,  is independent with some aspects of providing assistance and also requires occasional cues.  Per PT, daughter is independent providing cues to her father during transfers.  Daughter could benefit from additional education from HHOT to provide the necessary physical and cognitive assistance for drop arm commode transfers.    Reasons goals not met: n/a secondary to all goals met  Recommendation:  Patient will benefit from ongoing skilled OT services in home health setting to continue to advance functional skills in the area of BADL and Reduce care partner burden.  Equipment: drop arm commode  Reasons for discharge: treatment goals met and discharge from hospital  Patient/family agrees with progress made and goals achieved: Yes  OT Discharge Precautions/Restrictions  Precautions Precautions: Fall Restrictions Weight Bearing Restrictions: Yes LLE Weight Bearing: Non weight bearing ADL ADL ADL Comments: Refer to FIM Vision/Perception  Vision- History Baseline Vision/History: Wears glasses;Glaucoma Wears Glasses: At all times Patient Visual Report: Diplopia (has had for ~20 years (per chart), prisms in glasses) Vision- Assessment Vision Assessment?: No apparent visual deficits  Cognition Overall Cognitive Status: Difficult to assess (cognition fluctuates during the day) Arousal/Alertness:  (awake yet generally very tired and often declines therapy) Orientation Level: Oriented to person;Oriented to place Memory Impairment: Decreased recall of new information;Decreased short term memory Decreased Short Term Memory: Verbal  basic;Functional basic Awareness: Impaired Awareness Impairment: Intellectual impairment Problem Solving: Impaired Problem Solving Impairment: Functional basic;Verbal basic Comments: Cognition varies daily however memory impairments evident at all times. Mobility  Supervision and VCs for bed mobility, Min assist with slide board transfers Trunk/Postural Assessment  Cervical Assessment Cervical Assessment: Within Functional Limits Thoracic Assessment Thoracic Assessment: Within Functional Limits Lumbar Assessment Lumbar Assessment: Within Functional Limits Postural Control Postural Control: Within Functional Limits  Balance Dynamic Sitting Balance Dynamic Sitting - Level of Assistance: 5: Stand by assistance Extremity/Trunk Assessment RUE Assessment RUE Assessment: Within Functional Limits RUE Strength RUE Overall Strength: Other (Comment) (deconditioned) RUE Overall Strength Comments: 4/5 LUE Assessment LUE Assessment: Exceptions to WFL LUE AROM (degrees) Left Shoulder Flexion: 120 Degrees LUE Strength LUE Overall Strength Comments: 4-/5 Left Shoulder Flexion: 3+/5  See FIM for current functional status   K  05/28/2013, 3:20 PM  

## 2013-05-29 DIAGNOSIS — N186 End stage renal disease: Secondary | ICD-10-CM

## 2013-05-29 DIAGNOSIS — I10 Essential (primary) hypertension: Secondary | ICD-10-CM

## 2013-05-29 DIAGNOSIS — S88119A Complete traumatic amputation at level between knee and ankle, unspecified lower leg, initial encounter: Secondary | ICD-10-CM

## 2013-05-29 DIAGNOSIS — L98499 Non-pressure chronic ulcer of skin of other sites with unspecified severity: Secondary | ICD-10-CM

## 2013-05-29 DIAGNOSIS — N039 Chronic nephritic syndrome with unspecified morphologic changes: Secondary | ICD-10-CM

## 2013-05-29 DIAGNOSIS — I739 Peripheral vascular disease, unspecified: Secondary | ICD-10-CM

## 2013-05-29 DIAGNOSIS — D631 Anemia in chronic kidney disease: Secondary | ICD-10-CM

## 2013-05-29 NOTE — Progress Notes (Signed)
Education on stump wrapping completed with 2 daughters at the bedside. Discharge to home at approximately 1045 am. Discharge instructions given to daugter with verbalized understanding.

## 2013-05-29 NOTE — Progress Notes (Signed)
[place] PHYSICAL MEDICINE & REHABILITATION     PROGRESS NOTE    Subjective/Complaints: No complaints. Ready to go home A 12 point review of systems has been performed and if not noted above is otherwise negative.   Objective: Vital Signs: Blood pressure 125/61, pulse 64, temperature 98 F (36.7 C), temperature source Oral, resp. rate 18, height 5\' 10"  (1.778 m), weight 66.18 kg (145 lb 14.4 oz), SpO2 100.00%. No results found.  Recent Labs  05/28/13 0712  WBC 6.0  HGB 7.7*  HCT 25.6*  PLT 257    Recent Labs  05/28/13 0712  NA 135*  K 3.5*  CL 97  GLUCOSE 118*  BUN 36*  CREATININE 4.98*  CALCIUM 9.1   CBG (last 3)  No results found for this basename: GLUCAP,  in the last 72 hours  Wt Readings from Last 3 Encounters:  05/29/13 66.18 kg (145 lb 14.4 oz)  05/17/13 67.7 kg (149 lb 4 oz)  05/17/13 67.7 kg (149 lb 4 oz)    Physical Exam:  Constitutional: He is oriented to person, place, and time.   HENT: oral mucosa pink/moist  Head: Normocephalic and atraumatic.  Eyes: Conjunctivae are normal.  Neck: Normal range of motion. Neck supple.  Cardiovascular: Normal rate and regular rhythm. No murmurs  Respiratory: Effort normal and breath sounds normal. No wheezes rales or rhonchi  GI: Soft. Bowel sounds are normal.  Musculoskeletal: He exhibits tenderness (left BKA site. )--tolerated dressing change.  Well healed right transmet site. Left hand with thenar atrophy and mild contracture.  Neurological: He is oriented to person, place, and time.  Appropriate affect.   Improving insight and awareness. Stocking-glove peripheral neuropathy. RLE weakness noted.  Skin: Skin is warm and dry. Wound clean and intact with only slight drainage. Area appropriately tender Psychiatric: His affect is blunt. He is withdrawn.    Assessment/Plan: 1. Functional deficits secondary to left BKA which require 3+ hours per day of interdisciplinary therapy in a comprehensive inpatient  rehab setting. Physiatrist is providing close team supervision and 24 hour management of active medical problems listed below. Physiatrist and rehab team continue to assess barriers to discharge/monitor patient progress toward functional and medical goals.  Daughter needs ed re: ACE wrap for bka Dc today  FIM: FIM - Bathing Bathing Steps Patient Completed: Chest;Right Arm;Left Arm;Abdomen;Front perineal area;Buttocks;Right upper leg;Left upper leg;Right lower leg (including foot) Bathing: 5: Set-up assist to: Open items  FIM - Upper Body Dressing/Undressing Upper body dressing/undressing steps patient completed: Thread/unthread right sleeve of pullover shirt/dresss;Thread/unthread left sleeve of pullover shirt/dress;Put head through opening of pull over shirt/dress;Pull shirt over trunk Upper body dressing/undressing: 5: Set-up assist to: Obtain clothing/put away (EOB) FIM - Lower Body Dressing/Undressing Lower body dressing/undressing steps patient completed: Thread/unthread right pants leg;Thread/unthread left pants leg;Pull pants up/down;Don/Doff left shoe Lower body dressing/undressing:  (EOB and supine in bed)  FIM - Toileting Toileting steps completed by patient: Adjust clothing prior to toileting;Performs perineal hygiene Toileting: 3: Mod-Patient completed 2 of 3 steps  FIM - Radio producer Devices: Recruitment consultant Transfers: 4-To toilet/BSC: Min A (steadying Pt. > 75%);3-From toilet/BSC: Mod A (lift or lower assist)  FIM - Control and instrumentation engineer Devices: Sliding board;Arm rests Bed/Chair Transfer: 5: Supine > Sit: Supervision (verbal cues/safety issues);5: Sit > Supine: Supervision (verbal cues/safety issues);4: Bed > Chair or W/C: Min A (steadying Pt. > 75%);4: Chair or W/C > Bed: Min A (steadying Pt. > 75%)  FIM - Locomotion:  Wheelchair Distance: 50 Locomotion: Wheelchair: 2: Travels 50 - 149 ft with minimal  assistance (Pt.>75%) FIM - Locomotion: Ambulation Locomotion: Ambulation: 0: Activity did not occur  Comprehension Comprehension Mode: Auditory Comprehension: 5-Understands complex 90% of the time/Cues < 10% of the time  Expression Expression Mode: Verbal Expression: 5-Expresses complex 90% of the time/cues < 10% of the time  Social Interaction Social Interaction: 6-Interacts appropriately with others with medication or extra time (anti-anxiety, antidepressant).  Problem Solving Problem Solving: 3-Solves basic 50 - 74% of the time/requires cueing 25 - 49% of the time  Memory Memory: 3-Recognizes or recalls 50 - 74% of the time/requires cueing 25 - 49% of the time  Medical Problem List and Plan:  1. Functional deficits secondary to L-BKA   -prosthetic ed by [facility]  2. DVT Prophylaxis/Anticoagulation: Pharmaceutical: Lovenox  3. Pain Management: Reports pain with mobility and with dependency of LLE.Restart Gabapentin low dose qhs opnly for phantom pain    4. Mood: continue egosupport. Reasonable spirits at present  5. Neuropsych: This patient is capable of making decisions on his own behalf.  6. ESRD: Continue HD on Mon, Wed, Fri after therapy day is completed 7. Hypotension: BP medications discontinued.    -encouraging po intake  -volume mgt per nephrology 8. Anemia of chronic disease: Continue aranesp. Follow up with HD.  9. MRSA bacteremia: IV vancomycin initiated 04/17/13--8 week duration  LOS (Days) 11 A FACE TO FACE EVALUATION WAS PERFORMED  Meredith Staggers 05/29/2013 8:14 AM

## 2013-06-01 ENCOUNTER — Encounter: Payer: Self-pay | Admitting: Infectious Disease

## 2013-06-01 ENCOUNTER — Ambulatory Visit (INDEPENDENT_AMBULATORY_CARE_PROVIDER_SITE_OTHER): Payer: Medicare Other | Admitting: Infectious Disease

## 2013-06-01 VITALS — BP 104/69 | HR 88 | Temp 98.5°F | Ht 70.0 in | Wt 149.0 lb

## 2013-06-01 DIAGNOSIS — N186 End stage renal disease: Secondary | ICD-10-CM

## 2013-06-01 DIAGNOSIS — E1151 Type 2 diabetes mellitus with diabetic peripheral angiopathy without gangrene: Secondary | ICD-10-CM | POA: Insufficient documentation

## 2013-06-01 DIAGNOSIS — S88119A Complete traumatic amputation at level between knee and ankle, unspecified lower leg, initial encounter: Secondary | ICD-10-CM

## 2013-06-01 DIAGNOSIS — Z992 Dependence on renal dialysis: Secondary | ICD-10-CM

## 2013-06-01 DIAGNOSIS — E11628 Type 2 diabetes mellitus with other skin complications: Secondary | ICD-10-CM | POA: Insufficient documentation

## 2013-06-01 DIAGNOSIS — Z89519 Acquired absence of unspecified leg below knee: Secondary | ICD-10-CM

## 2013-06-01 DIAGNOSIS — E1169 Type 2 diabetes mellitus with other specified complication: Secondary | ICD-10-CM

## 2013-06-01 DIAGNOSIS — L089 Local infection of the skin and subcutaneous tissue, unspecified: Secondary | ICD-10-CM

## 2013-06-01 DIAGNOSIS — C9000 Multiple myeloma not having achieved remission: Secondary | ICD-10-CM

## 2013-06-01 DIAGNOSIS — R7881 Bacteremia: Secondary | ICD-10-CM

## 2013-06-01 DIAGNOSIS — E1051 Type 1 diabetes mellitus with diabetic peripheral angiopathy without gangrene: Secondary | ICD-10-CM

## 2013-06-01 DIAGNOSIS — E1059 Type 1 diabetes mellitus with other circulatory complications: Secondary | ICD-10-CM

## 2013-06-01 DIAGNOSIS — I96 Gangrene, not elsewhere classified: Secondary | ICD-10-CM

## 2013-06-01 DIAGNOSIS — A4902 Methicillin resistant Staphylococcus aureus infection, unspecified site: Secondary | ICD-10-CM

## 2013-06-01 NOTE — Patient Instructions (Signed)
We will let [facility] to stop your IV vancomycin  Make appt to see me in June so we can check surveillance blood cultures and re-examine your amputation site

## 2013-06-01 NOTE — Progress Notes (Signed)
Subjective:    Patient ID: Jack [name], male    DOB: 1945/07/09, 68 y.o.   MRN: [id number]  HPI  68 y.o. male with Multiple myeloma, DM, ESRD on HD via AV fistula with gangrrene in left  foot sp 1st amputation on March 25th,  readmitted forin April with MRSA bacteremia and gangrene in left foot sp I and D by [facility], then Trans-metatarsal amputation by Dr. Sharol Given that ultimately failed to control infection in that foot and required a BKA on May 1st, 2015. He has been receiving IV vancomycin with HD since then and now has received more than 6 weeks of IV vancomycin since clearance of his bacteremia and has had 18 days postoperative vancomycin since his curative BKA. Site is healing and without purulence though he does have considerable pain there still.    Review of Systems  Constitutional: Positive for activity change. Negative for fever, chills, diaphoresis, appetite change, fatigue and unexpected weight change.  HENT: Negative for congestion, sinus pressure and sneezing.   Eyes: Negative for photophobia and visual disturbance.  Respiratory: Negative for cough.   Gastrointestinal: Negative.  Negative for nausea, vomiting and diarrhea.  Genitourinary: Negative for dysuria, hematuria, flank pain and difficulty urinating.  Musculoskeletal: Positive for myalgias. Negative for arthralgias, back pain, gait problem and joint swelling.  Skin: Positive for wound. Negative for color change, pallor and rash.  Neurological: Negative for dizziness and light-headedness.  Hematological: Does not bruise/bleed easily.  Psychiatric/Behavioral: Negative for behavioral problems, confusion, sleep disturbance, dysphoric mood, decreased concentration and agitation.       Objective:   Physical Exam  Nursing note and vitals reviewed. Constitutional: He is oriented to person, place, and time. He appears well-developed and well-nourished. No distress.  HENT:  Head: Normocephalic.  Eyes: Conjunctivae and EOM  are normal.  Neck: Normal range of motion. Neck supple.  Cardiovascular: Normal rate and regular rhythm.   Pulmonary/Chest: Effort normal. No respiratory distress. He has no wheezes.  Abdominal: Soft. He exhibits no distension.  Neurological: He is alert and oriented to person, place, and time.  Skin: Skin is warm and dry. He is not diaphoretic.  Psychiatric: He has a normal mood and affect. His behavior is normal. Judgment and thought content normal.   MSK, skin:  Bka site with scabbing up of sites. No purulence:             Assessment & Plan:   68 year old with type 1 DM, multiple myeloma with ESRD on HD, PVD who had gangrenous changes requiring amputation of first toe on March 25th, then readmitted with MRSA bacteremia in April progression of infection in his foot requiring I and D by [facility], TMT amputation by Dr. Sharol Given and ultimately BKA by Dr. Sharol Given on 05/14/13. He had TEE that was negative for vegetations and he has received more than 6 weeks of IV vancomycin  #1 MRSA bacteremia: sp 6 weeks from clearance and now sp 28 days since removal of original source with BKA  --stop IV antibiotics (Tamika Cari Caraway called his HD center) --bring back for surveillance cultures + two weeks post HD this Thursday  --follow the wound closely I spent greater than 40 minutes with the patient including greater than 50% of time in face to face counsel of the patient and in coordination of their care.   #2 Gangrenous foot: in pt with DM, MM, PVD ultimately required BKA. Watch BKA site closely. Hopefully can protect it from infection. Follow closely with Dr  [name]  #3 Pain at [place] site and also ? Phantom limb pain. Followup with Dr Sharol Given and PCP

## 2013-06-01 NOTE — Discharge Summary (Signed)
Physician Discharge Summary  Patient ID: Jack [name] MRN: [id number] DOB/AGE: 1945/09/04 68 y.o.  Admit date: 05/18/2013 Discharge date: 06/01/2013  Discharge Diagnoses:  Principal Problem:   BKA (below knee amputation) unilateral Active Problems:   Hypertension   End-stage renal disease on hemodialysis   Peripheral vascular disease, unspecified   BKA stump complication   Diabetes mellitus with peripheral vascular disease   Discharged Condition: Stable.    Labs:  Basic Metabolic Panel:  Recent Labs Lab 05/28/13 0712  NA 135*  K 3.5*  CL 97  CO2 28  GLUCOSE 118*  BUN 36*  CREATININE 4.98*  CALCIUM 9.1  PHOS 3.2    CBC:  Recent Labs Lab 05/28/13 0712  WBC 6.0  HGB 7.7*  HCT 25.6*  MCV 88.3  PLT 257    CBG: No results found for this basename: GLUCAP,  in the last 168 hours  Brief HPI:   Jack [name] is a 68 y.o. male with h/o of MM, ESRD-HD, DM type 2 with insensate neuropathy, recent MRSA bacteremia and poorly healing L-transmet amputation wound with progressive gangrenous changes. Was discharged to [facility] for therapy and antibiotics. He continued to have poor wound healing and was admitted on 05/14/13 for L-BKA by Dr. Sharol Given. Post op HD ongoing and transfused of acute on chronic anemia. To continue IV vancomycin through 05/28/13 for bacteremia from foot wound. Attempted to get OOB independently and sustained a fall this am. Therapy working on balance as pregait activities. CIR recommended by rehab team and patient admitted for progressive therapy.    Hospital Course: Jack [name] was admitted to rehab 05/18/2013 for inpatient therapies to consist of PT, ST and OT at least three hours five days a week. Past admission physiatrist, therapy team and rehab RN have worked together to provide customized collaborative inpatient rehab. Hemodialysis was ongoing on Mon, Wed, Fri after therapy sessions. Due to hypotension BP medications were discontinued.  IV vancomycin was administered in HD and recommendations for 8 weeks duration. Gabapentin was decreased to 100 mg at bedtime to help with phantom pain and decrease cognitive side effects.  Pain control has improved. Wound has been healing well without s/s of infection. He will continue to receive HHPT, HHOT as well as HHRN by [facility] for follow up therapies past discharge.    Rehab course: During patient's stay in rehab weekly team conferences were held to monitor patient's progress, set goals and discuss barriers to discharge. Patient has had improvement in activity tolerance, balance, postural control, as well as ability to compensate for deficits. He requires set up assist for bathing and dressing and moderate assist for toileting. He requires min assist for transfers with cues for supervision. Family education was done with daughter and LOS was extended by a day to help family get more efficient in providing care needed.    Disposition: 01-Home or Self Care   Diet: Renal diet. Diabetic restrictions.   Special Instructions: 1. Limit fluids to 5 cups daily.  2. [facility] to provide PT, OT, RN.     Medication List    STOP taking these medications       aspirin EC 81 MG tablet     calcium acetate 667 MG capsule  Commonly known as:  PHOSLO     doxazosin 2 MG tablet  Commonly known as:  CARDURA     doxercalciferol 4 MCG/2ML injection  Commonly known as:  HECTOROL     loperamide 2 MG  capsule  Commonly known as:  IMODIUM     nebivolol 5 MG tablet  Commonly known as:  BYSTOLIC     ONGLYZA 5 MG Tabs tablet  Generic drug:  saxagliptin HCl     sodium chloride 0.9 % SOLN 100 mL with ferric gluconate 12.5 MG/ML SOLN 125 mg      TAKE these medications       multivitamin Tabs tablet  Take 1 tablet by mouth at bedtime.     b complex-vitamin c-folic acid 0.8 MG Tabs tablet  Take 1 tablet by mouth daily.     colchicine 0.6 MG tablet  Take 0.5 tablets (0.3 mg  total) by mouth daily.     feeding supplement (RESOURCE BREEZE) Liqd  Take 1 Container by mouth 3 (three) times daily between meals.     gabapentin 100 MG capsule  Commonly known as:  NEURONTIN  Take 1 capsule (100 mg total) by mouth at bedtime.     lanthanum 1000 MG chewable tablet  Commonly known as:  FOSRENOL  Chew 1 tablet (1,000 mg total) by mouth 3 (three) times daily with meals.     levothyroxine 175 MCG tablet  Commonly known as:  SYNTHROID, LEVOTHROID  Take 175 mcg by mouth daily before breakfast.     methocarbamol 500 MG tablet  Commonly known as:  ROBAXIN  Take 1 tablet (500 mg total) by mouth every 6 (six) hours as needed for muscle spasms.     Oxycodone HCl 10 MG Tabs--Rx 30 pills   Take 0.5-1 tablets (5-10 mg total) by mouth every 6 (six) hours as needed for severe pain.     senna-docusate 8.6-50 MG per tablet  Commonly known as:  Senokot-S  Take 2 tablets by mouth at bedtime. For constipation       Follow-up Information   Follow up with [name], MD On 06/10/2013. (@ 3:45 PM)    Specialty:  Family Medicine   Contact information:   [address] [place] [address] [phone]       Follow up with Meredith Staggers, MD On 08/02/2013. (Be there at 10:30 am for 11 am  appointment )    Specialty:  Physical Medicine and Rehabilitation   Contact information:   [address], [place] [address] [phone]       Signed: Bary Leriche 06/01/2013, 3:52 PM

## 2013-06-14 DIAGNOSIS — I739 Peripheral vascular disease, unspecified: Secondary | ICD-10-CM | POA: Diagnosis not present

## 2013-06-14 DIAGNOSIS — L89309 Pressure ulcer of unspecified buttock, unspecified stage: Secondary | ICD-10-CM

## 2013-06-14 DIAGNOSIS — N186 End stage renal disease: Secondary | ICD-10-CM

## 2013-06-14 DIAGNOSIS — E1142 Type 2 diabetes mellitus with diabetic polyneuropathy: Secondary | ICD-10-CM | POA: Diagnosis not present

## 2013-06-14 DIAGNOSIS — C9000 Multiple myeloma not having achieved remission: Secondary | ICD-10-CM

## 2013-06-14 DIAGNOSIS — Z8614 Personal history of Methicillin resistant Staphylococcus aureus infection: Secondary | ICD-10-CM

## 2013-06-14 DIAGNOSIS — E1149 Type 2 diabetes mellitus with other diabetic neurological complication: Secondary | ICD-10-CM | POA: Diagnosis not present

## 2013-06-14 DIAGNOSIS — Z4801 Encounter for change or removal of surgical wound dressing: Secondary | ICD-10-CM

## 2013-06-14 DIAGNOSIS — Z48812 Encounter for surgical aftercare following surgery on the circulatory system: Secondary | ICD-10-CM | POA: Diagnosis not present

## 2013-06-14 DIAGNOSIS — L8992 Pressure ulcer of unspecified site, stage 2: Secondary | ICD-10-CM

## 2013-06-14 DIAGNOSIS — I120 Hypertensive chronic kidney disease with stage 5 chronic kidney disease or end stage renal disease: Secondary | ICD-10-CM

## 2013-06-15 ENCOUNTER — Telehealth: Payer: Self-pay

## 2013-06-15 NOTE — Telephone Encounter (Signed)
ok 

## 2013-06-15 NOTE — Telephone Encounter (Signed)
Spoke with Patty and informed her it would be ok to continue therapy.

## 2013-06-15 NOTE — Telephone Encounter (Signed)
Patty an Therapist, sports with [facility] called to get order to continue home health to monitor patients wound.

## 2013-06-29 ENCOUNTER — Ambulatory Visit (HOSPITAL_COMMUNITY): Payer: Medicare Other

## 2013-06-29 ENCOUNTER — Other Ambulatory Visit: Payer: Medicare Other

## 2013-07-06 ENCOUNTER — Ambulatory Visit: Payer: Medicare Other | Admitting: Hematology and Oncology

## 2013-07-15 ENCOUNTER — Ambulatory Visit: Payer: Medicare Other | Admitting: Infectious Disease

## 2013-07-22 ENCOUNTER — Other Ambulatory Visit (HOSPITAL_COMMUNITY): Payer: Self-pay | Admitting: Orthopedic Surgery

## 2013-07-22 ENCOUNTER — Encounter (HOSPITAL_COMMUNITY): Payer: Self-pay | Admitting: *Deleted

## 2013-07-22 ENCOUNTER — Encounter (HOSPITAL_COMMUNITY): Payer: Self-pay | Admitting: Pharmacy Technician

## 2013-07-22 MED ORDER — CEFAZOLIN SODIUM-DEXTROSE 2-3 GM-% IV SOLR
2.0000 g | INTRAVENOUS | Status: AC
  Administered 2013-07-23: 2 g via INTRAVENOUS
  Filled 2013-07-22 (×2): qty 50

## 2013-07-22 NOTE — Progress Notes (Signed)
07/22/13 1904  OBSTRUCTIVE SLEEP APNEA  Have you ever been diagnosed with sleep apnea through a sleep study? No  Do you snore loudly (loud enough to be heard through closed doors)?  0  Do you often feel tired, fatigued, or sleepy during the daytime? 0  Has anyone observed you stop breathing during your sleep? 0  Do you have, or are you being treated for high blood pressure? 1  BMI more than 35 kg/m2? 0  Age over 70 years old? 1  Neck circumference greater than 40 cm/16 inches? 1 (17.5)  Gender: 1  Obstructive Sleep Apnea Score 4  Score 4 or greater  Results sent to PCP

## 2013-07-23 ENCOUNTER — Observation Stay (HOSPITAL_COMMUNITY)
Admission: RE | Admit: 2013-07-23 | Discharge: 2013-07-24 | Disposition: A | Discharge status: Home / Self Care | Payer: Medicare Other | Source: Ambulatory Visit | Attending: Orthopedic Surgery | Admitting: Orthopedic Surgery

## 2013-07-23 ENCOUNTER — Encounter (HOSPITAL_COMMUNITY)
Admission: RE | Disposition: A | Discharge status: Home / Self Care | Payer: Self-pay | Source: Ambulatory Visit | Attending: Orthopedic Surgery

## 2013-07-23 ENCOUNTER — Ambulatory Visit (HOSPITAL_COMMUNITY): Payer: Medicare Other | Admitting: Anesthesiology

## 2013-07-23 ENCOUNTER — Encounter (HOSPITAL_COMMUNITY): Payer: Medicare Other | Admitting: Anesthesiology

## 2013-07-23 ENCOUNTER — Encounter (HOSPITAL_COMMUNITY): Payer: Self-pay | Admitting: *Deleted

## 2013-07-23 DIAGNOSIS — C9000 Multiple myeloma not having achieved remission: Secondary | ICD-10-CM | POA: Insufficient documentation

## 2013-07-23 DIAGNOSIS — N186 End stage renal disease: Secondary | ICD-10-CM | POA: Insufficient documentation

## 2013-07-23 DIAGNOSIS — T8789 Other complications of amputation stump: Secondary | ICD-10-CM | POA: Diagnosis present

## 2013-07-23 DIAGNOSIS — S88119A Complete traumatic amputation at level between knee and ankle, unspecified lower leg, initial encounter: Secondary | ICD-10-CM | POA: Insufficient documentation

## 2013-07-23 DIAGNOSIS — I798 Other disorders of arteries, arterioles and capillaries in diseases classified elsewhere: Secondary | ICD-10-CM | POA: Insufficient documentation

## 2013-07-23 DIAGNOSIS — N2581 Secondary hyperparathyroidism of renal origin: Secondary | ICD-10-CM | POA: Diagnosis not present

## 2013-07-23 DIAGNOSIS — Z8614 Personal history of Methicillin resistant Staphylococcus aureus infection: Secondary | ICD-10-CM | POA: Diagnosis not present

## 2013-07-23 DIAGNOSIS — Z992 Dependence on renal dialysis: Secondary | ICD-10-CM | POA: Insufficient documentation

## 2013-07-23 DIAGNOSIS — Z87891 Personal history of nicotine dependence: Secondary | ICD-10-CM | POA: Diagnosis not present

## 2013-07-23 DIAGNOSIS — J45909 Unspecified asthma, uncomplicated: Secondary | ICD-10-CM | POA: Diagnosis not present

## 2013-07-23 DIAGNOSIS — Y835 Amputation of limb(s) as the cause of abnormal reaction of the patient, or of later complication, without mention of misadventure at the time of the procedure: Secondary | ICD-10-CM | POA: Diagnosis not present

## 2013-07-23 DIAGNOSIS — I120 Hypertensive chronic kidney disease with stage 5 chronic kidney disease or end stage renal disease: Secondary | ICD-10-CM | POA: Diagnosis not present

## 2013-07-23 DIAGNOSIS — E1159 Type 2 diabetes mellitus with other circulatory complications: Secondary | ICD-10-CM | POA: Diagnosis not present

## 2013-07-23 DIAGNOSIS — R011 Cardiac murmur, unspecified: Secondary | ICD-10-CM | POA: Insufficient documentation

## 2013-07-23 DIAGNOSIS — T8781 Dehiscence of amputation stump: Secondary | ICD-10-CM | POA: Diagnosis present

## 2013-07-23 HISTORY — PX: AMPUTATION: SHX166

## 2013-07-23 LAB — COMPREHENSIVE METABOLIC PANEL
ALK PHOS: 99 U/L (ref 39–117)
ALT: 20 U/L (ref 0–53)
AST: 19 U/L (ref 0–37)
Albumin: 3.1 g/dL — ABNORMAL LOW (ref 3.5–5.2)
Anion gap: 18 — ABNORMAL HIGH (ref 5–15)
BILIRUBIN TOTAL: 0.3 mg/dL (ref 0.3–1.2)
BUN: 13 mg/dL (ref 6–23)
CHLORIDE: 92 meq/L — AB (ref 96–112)
CO2: 27 meq/L (ref 19–32)
Calcium: 8.7 mg/dL (ref 8.4–10.5)
Creatinine, Ser: 3.25 mg/dL — ABNORMAL HIGH (ref 0.50–1.35)
GFR calc Af Amer: 21 mL/min — ABNORMAL LOW (ref >90)
GFR, EST NON AFRICAN AMERICAN: 18 mL/min — AB (ref >90)
Glucose, Bld: 111 mg/dL — ABNORMAL HIGH (ref 70–99)
Potassium: 3.6 mEq/L — ABNORMAL LOW (ref 3.7–5.3)
SODIUM: 137 meq/L (ref 137–147)
Total Protein: 7.4 g/dL (ref 6.0–8.3)

## 2013-07-23 LAB — CBC
HCT: 41.7 % (ref 39.0–52.0)
HEMOGLOBIN: 12.6 g/dL — AB (ref 13.0–17.0)
MCH: 26.1 pg (ref 26.0–34.0)
MCHC: 30.2 g/dL (ref 30.0–36.0)
MCV: 86.3 fL (ref 78.0–100.0)
Platelets: 206 10*3/uL (ref 150–400)
RBC: 4.83 MIL/uL (ref 4.22–5.81)
RDW: 15.3 % (ref 11.5–15.5)
WBC: 8.2 10*3/uL (ref 4.0–10.5)

## 2013-07-23 LAB — GLUCOSE, CAPILLARY
Glucose-Capillary: 117 mg/dL — ABNORMAL HIGH (ref 70–99)
Glucose-Capillary: 90 mg/dL (ref 70–99)
Glucose-Capillary: 101 mg/dL — ABNORMAL HIGH (ref 70–99)
Glucose-Capillary: 141 mg/dL — ABNORMAL HIGH (ref 70–99)

## 2013-07-23 LAB — PROTIME-INR
INR: 1.04 (ref 0.00–1.49)
Prothrombin Time: 13.6 seconds (ref 11.6–15.2)

## 2013-07-23 LAB — APTT: aPTT: 34 seconds (ref 24–37)

## 2013-07-23 SURGERY — AMPUTATION BELOW KNEE
Anesthesia: General | Site: Leg Lower | Laterality: Left

## 2013-07-23 MED ORDER — ONDANSETRON HCL 4 MG PO TABS: 4.0000 mg | ORAL_TABLET | Freq: Four times a day (QID) | ORAL | Status: DC | PRN

## 2013-07-23 MED ORDER — DOCUSATE SODIUM 100 MG PO CAPS
100.0000 mg | ORAL_CAPSULE | Freq: Two times a day (BID) | ORAL | Status: DC
  Administered 2013-07-23 – 2013-07-24 (×2): 100 mg via ORAL
  Filled 2013-07-23 (×3): qty 1

## 2013-07-23 MED ORDER — RENA-VITE PO TABS: 1.0000 | ORAL_TABLET | Freq: Every day | ORAL | Status: DC

## 2013-07-23 MED ORDER — SENNOSIDES-DOCUSATE SODIUM 8.6-50 MG PO TABS: 1.0000 | ORAL_TABLET | Freq: Every evening | ORAL | Status: DC | PRN

## 2013-07-23 MED ORDER — CEFAZOLIN SODIUM 1-5 GM-% IV SOLN
1.0000 g | Freq: Once | INTRAVENOUS | Status: AC
  Administered 2013-07-24: 1 g via INTRAVENOUS
  Filled 2013-07-23: qty 50

## 2013-07-23 MED ORDER — MAGNESIUM CITRATE PO SOLN: 1.0000 | Freq: Once | ORAL | Status: AC | PRN

## 2013-07-23 MED ORDER — ASPIRIN EC 81 MG PO TBEC
81.0000 mg | DELAYED_RELEASE_TABLET | Freq: Every day | ORAL | Status: DC
  Administered 2013-07-24: 81 mg via ORAL
  Filled 2013-07-23: qty 1

## 2013-07-23 MED ORDER — OXYCODONE-ACETAMINOPHEN 5-325 MG PO TABS
1.0000 | ORAL_TABLET | ORAL | Status: DC | PRN
  Administered 2013-07-23 – 2013-07-24 (×4): 2 via ORAL
  Filled 2013-07-23 (×4): qty 2

## 2013-07-23 MED ORDER — HYDROMORPHONE HCL PF 1 MG/ML IJ SOLN
INTRAMUSCULAR | Status: AC
  Filled 2013-07-23: qty 1

## 2013-07-23 MED ORDER — METHOCARBAMOL 500 MG PO TABS
ORAL_TABLET | ORAL | Status: AC
  Filled 2013-07-23: qty 1

## 2013-07-23 MED ORDER — HYDROMORPHONE HCL PF 1 MG/ML IJ SOLN
0.2500 mg | INTRAMUSCULAR | Status: DC | PRN
  Administered 2013-07-23 (×2): 0.25 mg via INTRAVENOUS
  Administered 2013-07-23 (×3): 0.5 mg via INTRAVENOUS

## 2013-07-23 MED ORDER — METHOCARBAMOL 1000 MG/10ML IJ SOLN: 500.0000 mg | Freq: Four times a day (QID) | INTRAVENOUS | Status: DC | PRN

## 2013-07-23 MED ORDER — PHENYLEPHRINE HCL 10 MG/ML IJ SOLN
INTRAMUSCULAR | Status: DC | PRN
  Administered 2013-07-23: 120 ug via INTRAVENOUS
  Administered 2013-07-23: 40 ug via INTRAVENOUS
  Administered 2013-07-23: 80 ug via INTRAVENOUS

## 2013-07-23 MED ORDER — COLCHICINE 0.6 MG PO TABS
0.3000 mg | ORAL_TABLET | Freq: Every day | ORAL | Status: DC
  Administered 2013-07-24: 0.3 mg via ORAL
  Filled 2013-07-23: qty 0.5

## 2013-07-23 MED ORDER — CALCIUM ACETATE 667 MG PO CAPS
1334.0000 mg | ORAL_CAPSULE | Freq: Three times a day (TID) | ORAL | Status: DC
  Administered 2013-07-24: 1334 mg via ORAL
  Filled 2013-07-23 (×4): qty 2

## 2013-07-23 MED ORDER — SODIUM CHLORIDE 0.9 % IV SOLN: INTRAVENOUS | Status: DC

## 2013-07-23 MED ORDER — LANTHANUM CARBONATE 500 MG PO CHEW: 1000.0000 mg | CHEWABLE_TABLET | Freq: Three times a day (TID) | ORAL | Status: DC

## 2013-07-23 MED ORDER — RENA-VITE PO TABS
1.0000 | ORAL_TABLET | Freq: Every day | ORAL | Status: DC
  Administered 2013-07-23: 1 via ORAL
  Filled 2013-07-23 (×2): qty 1

## 2013-07-23 MED ORDER — LANTHANUM CARBONATE 500 MG PO CHEW
1000.0000 mg | CHEWABLE_TABLET | Freq: Three times a day (TID) | ORAL | Status: DC
  Administered 2013-07-24: 1000 mg via ORAL
  Filled 2013-07-23 (×4): qty 2

## 2013-07-23 MED ORDER — FENTANYL CITRATE 0.05 MG/ML IJ SOLN
INTRAMUSCULAR | Status: DC | PRN
  Administered 2013-07-23: 100 ug via INTRAVENOUS

## 2013-07-23 MED ORDER — METOCLOPRAMIDE HCL 10 MG PO TABS: 5.0000 mg | ORAL_TABLET | Freq: Three times a day (TID) | ORAL | Status: DC | PRN

## 2013-07-23 MED ORDER — BOOST / RESOURCE BREEZE PO LIQD
1.0000 | Freq: Three times a day (TID) | ORAL | Status: DC
  Administered 2013-07-23 – 2013-07-24 (×2): 1 via ORAL

## 2013-07-23 MED ORDER — HYDROMORPHONE HCL PF 1 MG/ML IJ SOLN: 0.5000 mg | INTRAMUSCULAR | Status: DC | PRN

## 2013-07-23 MED ORDER — FENTANYL CITRATE 0.05 MG/ML IJ SOLN
INTRAMUSCULAR | Status: AC
  Filled 2013-07-23: qty 5

## 2013-07-23 MED ORDER — 0.9 % SODIUM CHLORIDE (POUR BTL) OPTIME
TOPICAL | Status: DC | PRN
  Administered 2013-07-23: 1000 mL

## 2013-07-23 MED ORDER — ONDANSETRON HCL 4 MG/2ML IJ SOLN: 4.0000 mg | Freq: Four times a day (QID) | INTRAMUSCULAR | Status: DC | PRN

## 2013-07-23 MED ORDER — METOCLOPRAMIDE HCL 5 MG/ML IJ SOLN: 5.0000 mg | Freq: Three times a day (TID) | INTRAMUSCULAR | Status: DC | PRN

## 2013-07-23 MED ORDER — BISACODYL 5 MG PO TBEC: 5.0000 mg | DELAYED_RELEASE_TABLET | Freq: Every day | ORAL | Status: DC | PRN

## 2013-07-23 MED ORDER — LIDOCAINE HCL (CARDIAC) 20 MG/ML IV SOLN
INTRAVENOUS | Status: DC | PRN
  Administered 2013-07-23: 100 mg via INTRAVENOUS

## 2013-07-23 MED ORDER — SODIUM CHLORIDE 0.9 % IV SOLN
INTRAVENOUS | Status: DC
  Administered 2013-07-23: 15:00:00 via INTRAVENOUS

## 2013-07-23 MED ORDER — METHOCARBAMOL 500 MG PO TABS
500.0000 mg | ORAL_TABLET | Freq: Four times a day (QID) | ORAL | Status: DC | PRN
  Administered 2013-07-23 – 2013-07-24 (×2): 500 mg via ORAL
  Filled 2013-07-23: qty 1

## 2013-07-23 MED ORDER — PROPOFOL 10 MG/ML IV BOLUS
INTRAVENOUS | Status: DC | PRN
  Administered 2013-07-23: 150 mg via INTRAVENOUS

## 2013-07-23 MED ORDER — LEVOTHYROXINE SODIUM 175 MCG PO TABS
175.0000 ug | ORAL_TABLET | Freq: Every day | ORAL | Status: DC
  Administered 2013-07-24: 175 ug via ORAL
  Filled 2013-07-23 (×2): qty 1

## 2013-07-23 MED ORDER — GABAPENTIN 100 MG PO CAPS
100.0000 mg | ORAL_CAPSULE | Freq: Every day | ORAL | Status: DC
  Administered 2013-07-23: 100 mg via ORAL
  Filled 2013-07-23 (×2): qty 1

## 2013-07-23 MED ORDER — ONDANSETRON HCL 4 MG/2ML IJ SOLN
INTRAMUSCULAR | Status: AC
  Filled 2013-07-23: qty 2

## 2013-07-23 MED ORDER — OXYCODONE HCL 5 MG PO TABS: 5.0000 mg | ORAL_TABLET | Freq: Four times a day (QID) | ORAL | Status: DC | PRN

## 2013-07-23 SURGICAL SUPPLY — 53 items
BANDAGE ESMARK 6X9 LF (GAUZE/BANDAGES/DRESSINGS) ×1 IMPLANT
BANDAGE GAUZE ELAST BULKY 4 IN (GAUZE/BANDAGES/DRESSINGS) ×3 IMPLANT
BLADE SAW RECIP 87.9 MT (BLADE) ×3 IMPLANT
BLADE SAW SAG 73X25 THK (BLADE) ×2
BLADE SAW SGTL 73X25 THK (BLADE) ×1 IMPLANT
BLADE SURG 21 STRL SS (BLADE) ×3 IMPLANT
BNDG COHESIVE 6X5 TAN STRL LF (GAUZE/BANDAGES/DRESSINGS) ×3 IMPLANT
BNDG ESMARK 6X9 LF (GAUZE/BANDAGES/DRESSINGS) ×3
COVER SURGICAL LIGHT HANDLE (MISCELLANEOUS) ×3 IMPLANT
CUFF TOURNIQUET SINGLE 34IN LL (TOURNIQUET CUFF) IMPLANT
CUFF TOURNIQUET SINGLE 44IN (TOURNIQUET CUFF) IMPLANT
DRAIN PENROSE 1/2X12 LTX STRL (WOUND CARE) IMPLANT
DRAPE EXTREMITY T 121X128X90 (DRAPE) ×3 IMPLANT
DRAPE PROXIMA HALF (DRAPES) ×6 IMPLANT
DRAPE U-SHAPE 47X51 STRL (DRAPES) ×6 IMPLANT
DRSG ADAPTIC 3X8 NADH LF (GAUZE/BANDAGES/DRESSINGS) ×3 IMPLANT
DRSG PAD ABDOMINAL 8X10 ST (GAUZE/BANDAGES/DRESSINGS) ×3 IMPLANT
DURAPREP 26ML APPLICATOR (WOUND CARE) ×3 IMPLANT
ELECT REM PT RETURN 9FT ADLT (ELECTROSURGICAL) ×3
ELECTRODE REM PT RTRN 9FT ADLT (ELECTROSURGICAL) ×1 IMPLANT
GLOVE BIO SURGEON STRL SZ 6.5 (GLOVE) ×2 IMPLANT
GLOVE BIO SURGEONS STRL SZ 6.5 (GLOVE) ×1
GLOVE BIOGEL PI IND STRL 6.5 (GLOVE) ×2 IMPLANT
GLOVE BIOGEL PI IND STRL 7.5 (GLOVE) ×1 IMPLANT
GLOVE BIOGEL PI IND STRL 9 (GLOVE) ×1 IMPLANT
GLOVE BIOGEL PI INDICATOR 6.5 (GLOVE) ×4
GLOVE BIOGEL PI INDICATOR 7.5 (GLOVE) ×2
GLOVE BIOGEL PI INDICATOR 9 (GLOVE) ×2
GLOVE SS BIOGEL STRL SZ 6.5 (GLOVE) ×1 IMPLANT
GLOVE SUPERSENSE BIOGEL SZ 6.5 (GLOVE) ×2
GLOVE SURG ORTHO 9.0 STRL STRW (GLOVE) ×3 IMPLANT
GOWN STRL REUS W/ TWL XL LVL3 (GOWN DISPOSABLE) ×3 IMPLANT
GOWN STRL REUS W/TWL XL LVL3 (GOWN DISPOSABLE) ×6
KIT BASIN OR (CUSTOM PROCEDURE TRAY) ×3 IMPLANT
KIT ROOM TURNOVER OR (KITS) ×3 IMPLANT
MANIFOLD NEPTUNE II (INSTRUMENTS) IMPLANT
NS IRRIG 1000ML POUR BTL (IV SOLUTION) ×3 IMPLANT
PACK GENERAL/GYN (CUSTOM PROCEDURE TRAY) ×3 IMPLANT
PAD ABD 8X10 STRL (GAUZE/BANDAGES/DRESSINGS) ×6 IMPLANT
PAD ARMBOARD 7.5X6 YLW CONV (MISCELLANEOUS) ×6 IMPLANT
SPONGE GAUZE 4X4 12PLY (GAUZE/BANDAGES/DRESSINGS) ×3 IMPLANT
SPONGE LAP 18X18 X RAY DECT (DISPOSABLE) IMPLANT
STAPLER VISISTAT 35W (STAPLE) ×3 IMPLANT
STOCKINETTE IMPERVIOUS LG (DRAPES) ×3 IMPLANT
SUT ETHILON 2 0 PSLX (SUTURE) ×3 IMPLANT
SUT PDS AB 1 CT  36 (SUTURE) ×4
SUT PDS AB 1 CT 36 (SUTURE) ×2 IMPLANT
SUT SILK 2 0 (SUTURE) ×2
SUT SILK 2-0 18XBRD TIE 12 (SUTURE) ×1 IMPLANT
TOWEL OR 17X24 6PK STRL BLUE (TOWEL DISPOSABLE) ×3 IMPLANT
TOWEL OR 17X26 10 PK STRL BLUE (TOWEL DISPOSABLE) ×3 IMPLANT
TUBE ANAEROBIC SPECIMEN COL (MISCELLANEOUS) IMPLANT
WATER STERILE IRR 1000ML POUR (IV SOLUTION) ×3 IMPLANT

## 2013-07-23 NOTE — Anesthesia Procedure Notes (Signed)
Procedure Name: LMA Insertion Date/Time: 07/23/2013 4:06 PM Performed by: Neldon Newport Pre-anesthesia Checklist: Patient identified, Timeout performed, Emergency Drugs available, Suction available and Patient being monitored Patient Re-evaluated:Patient Re-evaluated prior to inductionOxygen Delivery Method: Circle system utilized Preoxygenation: Pre-oxygenation with 100% oxygen Intubation Type: IV induction LMA: LMA inserted LMA Size: 4.0 Number of attempts: 1 Placement Confirmation: positive ETCO2 Tube secured with: Tape Dental Injury: Teeth and Oropharynx as per pre-operative assessment

## 2013-07-23 NOTE — Op Note (Signed)
07/23/2013  6:45 PM  PATIENT:  Jack [name]    PRE-OPERATIVE DIAGNOSIS:  Left BKA Dehiscence  POST-OPERATIVE DIAGNOSIS:  Same  PROCEDURE:  AMPUTATION BELOW KNEE  SURGEON:  Newt Minion, MD  PHYSICIAN ASSISTANT:None ANESTHESIA:   General  PREOPERATIVE INDICATIONS:  Jack [name] is a  68 y.o. male with a diagnosis of Left BKA Dehiscence who failed conservative measures and elected for surgical management.    The risks benefits and alternatives were discussed with the patient preoperatively including but not limited to the risks of infection, bleeding, nerve injury, cardiopulmonary complications, the need for revision surgery, among others, and the patient was willing to proceed.  OPERATIVE IMPLANTS: None  OPERATIVE FINDINGS: Muscle healthy and viable severe peripheral vascular disease of the vascular structures  OPERATIVE PROCEDURE: Patient was brought to the operating room and underwent a general anesthetic. After adequate levels of anesthesia were obtained patient's left lower extremity was prepped using DuraPrep draped in a sterile field. An elliptical incision was made around the necrotic gangrenous tissue the incision was carried down to bone the distal 2 cm of the tibia and fibula were resected. The wound is irrigated with normal saline hemostasis was obtained. The deep superficial fascial layers including the skin were closed using 2-0 nylon. The wound was covered with compressive dressing. Patient was extubated taken to the PACU in stable condition.

## 2013-07-23 NOTE — Transfer of Care (Signed)
Immediate Anesthesia Transfer of Care Note  Patient: Jack [name]  Procedure(s) Performed: Procedure(s) with comments: AMPUTATION BELOW KNEE (Left) - Left Below Knee Amputation Revision  Patient Location: PACU  Anesthesia Type:General  Level of Consciousness: sedated  Airway & Oxygen Therapy: Patient Spontanous Breathing and Patient connected to nasal cannula oxygen  Post-op Assessment: Report given to PACU RN, Post -op Vital signs reviewed and stable and Patient moving all extremities X 4  Post vital signs: Reviewed and stable  Complications: No apparent anesthesia complications

## 2013-07-23 NOTE — Progress Notes (Signed)
Called patient and ask that he come on into the hospital. Patient states he will come on in.

## 2013-07-23 NOTE — Anesthesia Preprocedure Evaluation (Addendum)
Anesthesia Evaluation  Patient identified by MRN, date of birth, ID band Patient awake    Reviewed: Allergy & Precautions, H&P , NPO status , Patient's Chart, lab work & pertinent test results  Airway Mallampati: II TM Distance: >3 FB Neck ROM: Full    Dental no notable dental hx. (+) Teeth Intact, Dental Advisory Given   Pulmonary asthma , former smoker,  breath sounds clear to auscultation  Pulmonary exam normal       Cardiovascular hypertension, On Medications + Peripheral Vascular Disease + dysrhythmias + Valvular Problems/Murmurs Rhythm:Regular Rate:Normal     Neuro/Psych negative neurological ROS  negative psych ROS   GI/Hepatic negative GI ROS, Neg liver ROS,   Endo/Other  diabetesHypothyroidism   Renal/GU Dialysis and ESRFRenal disease  negative genitourinary   Musculoskeletal   Abdominal   Peds  Hematology  (+) Blood dyscrasia, ,   Anesthesia Other Findings   Reproductive/Obstetrics negative OB ROS                         Anesthesia Physical Anesthesia Plan  ASA: III  Anesthesia Plan: General   Post-op Pain Management:    Induction: Intravenous  Airway Management Planned: LMA  Additional Equipment:   Intra-op Plan:   Post-operative Plan: Extubation in OR  Informed Consent: I have reviewed the patients History and Physical, chart, labs and discussed the procedure including the risks, benefits and alternatives for the proposed anesthesia with the patient or authorized representative who has indicated his/her understanding and acceptance.   Dental advisory given  Plan Discussed with: CRNA and Anesthesiologist  Anesthesia Plan Comments:        Anesthesia Quick Evaluation

## 2013-07-23 NOTE — H&P (Signed)
Jack [name] is an 68 y.o. male.   Chief Complaint: Dehiscence and gangrene of left transtibial amputation HPI: Patient is a 69 year old gentleman end stage renal disease dialysis with type 2 diabetes peripheral vascular disease who has had progressive gangrenous changes to the residual limb left transtibial amputation  Past Medical History  Diagnosis Date  . Hypertension   . Pneumonia     2012  . Heart murmur   . Glaucoma   . Multiple myeloma, without mention of having achieved remission 03/30/2012    Cytogenetic neg on 03/23/2012.  . Asthma   . Hyperparathyroidism, secondary renal   . Peripheral arterial disease   . End-stage renal disease on hemodialysis     Started HD March 2014.  Cause of ESRD was DM.  Gets HD at [facility] on [address] on MWF schedule.   . Thyroid disease     hyperparathyroidism  . MRSA bacteremia   . Anemia   . Peripheral vascular disease, unspecified 11/19/2012    In the past had R foot toe amps then R TMA. In 2015 had left foot toe amp > then TMA >then L BKA on 05/14/13   . History of MRSA infection 04/22/2013    Bacteremia assoc w L foot wound infection Mar 2015   . Gangrene of foot   . ESRD on hemodialysis   . Diabetes mellitus without complication     Type 2  . Type 1 diabetes mellitus with diabetic foot infection   . Diabetes mellitus with peripheral vascular disease     Past Surgical History  Procedure Laterality Date  . Thyroidectomy    . Cervical disc surgery    . Insertion of dialysis catheter Right 03/19/2012    Procedure: INSERTION OF DIALYSIS CATHETER;  Surgeon: Mal Misty, MD;  Location: [facility];  Service: Vascular;  Laterality: Right;  Right Internal Jugular  . Av fistula placement Left 03/25/2012    Procedure: ARTERIOVENOUS (AV) FISTULA CREATION;  Surgeon: Mal Misty, MD;  Location: [facility];  Service: Vascular;  Laterality: Left;  . Ligation of competing branches of arteriovenous fistula Left 05/08/2012    Procedure: LIGATION OF  COMPETING BRANCHES OF ARTERIOVENOUS FISTULA;  Surgeon: Mal Misty, MD;  Location: [place] [facility];  Service: Vascular;  Laterality: Left;  Ultrasound guided  . Cardiac catheterization      approx 30 years ago  . Amputation Right 11/10/2012    Procedure: AMPUTATION FIRST and SECOND TOES Right Foot;  Surgeon: Elam Dutch, MD;  Location: [facility] OR;  Service: Vascular;  Laterality: Right;  . Transmetatarsal amputation Left 12/16/2012    Procedure: TRANSMETATARSAL AMPUTATION AND VAC PLACEMENT;  Surgeon: Elam Dutch, MD;  Location: [place];  Service: Vascular;  Laterality: Left;  . Amputation Left 04/07/2013    Procedure: AMPUTATION DIGIT- LEFT 1ST TOE;  Surgeon: Mal Misty, MD;  Location: [facility];  Service: Vascular;  Laterality: Left;  . Tee without cardioversion N/A 04/20/2013    Procedure: TRANSESOPHAGEAL ECHOCARDIOGRAM (TEE);  Surgeon: Josue Hector, MD;  Location: [facility] ENDOSCOPY;  Service: Cardiovascular;  Laterality: N/A;  . I&d extremity Left 04/22/2013    Procedure: ZMOQHUTMLY YTK DEBRIDEMENT LEFT FIRST TOE AMPUTATION WOUND ;  Surgeon: Mal Misty, MD;  Location: [facility];  Service: Vascular;  Laterality: Left;  . Amputation Left 04/26/2013    Procedure: Left Foot Transmetatarsal Amputation;  Surgeon: Newt Minion, MD;  Location: [facility];  Service: Orthopedics;  Laterality: Left;  . Toe amputation  D/C 04-30-13  . Below knee leg amputation Left 05/14/2013    DR Warren Lindahl  . Amputation Left 05/14/2013    Procedure: AMPUTATION BELOW KNEE;  Surgeon: Newt Minion, MD;  Location: [facility];  Service: Orthopedics;  Laterality: Left;  Left Below Knee Amputation  . Eye surgery Bilateral     CATARACTS    Family History  Problem Relation Age of Onset  . Diabetes Mother   . Cancer Mother     bone   . Kidney disease Father    Social History:  reports that he quit smoking about 31 years ago. His smoking use included Cigarettes. He smoked 0.00 packs per day. He has never used smokeless tobacco. He reports that  he does not drink alcohol or use illicit drugs.  Allergies:  Allergies  Allergen Reactions  . Bee Venom Anaphylaxis  . Lisinopril Cough  . Ivp Dye [Iodinated Diagnostic Agents] Hives  . Morphine And Related Other (See Comments)    Bradycardia states patient    No prescriptions prior to admission    No results found for this or any previous visit (from the past 48 hour(s)). No results found.  Review of Systems  All other systems reviewed and are negative.   There were no vitals taken for this visit. Physical Exam  On examination patient has a large eschar covering the entire distal residual limb left lower extremity Assessment/Plan Assessment: Gangrene with dehiscence left transtibial amputation.  Plan: We'll plan for revision amputation. Risks and benefits were discussed including risk of the wound not healing. Patient states he understands was to proceed at this time.  Christa Fasig V 07/23/2013, 6:43 AM

## 2013-07-23 NOTE — Anesthesia Postprocedure Evaluation (Signed)
  Anesthesia Post-op Note  Patient: Jack [name]  Procedure(s) Performed: Procedure(s) with comments: AMPUTATION BELOW KNEE (Left) - Left Below Knee Amputation Revision  Patient Location: PACU  Anesthesia Type:General  Level of Consciousness: awake, oriented, sedated and patient cooperative  Airway and Oxygen Therapy: Patient Spontanous Breathing  Post-op Pain: mild  Post-op Assessment: Post-op Vital signs reviewed, Patient's Cardiovascular Status Stable, Respiratory Function Stable, Patent Airway and No signs of Nausea or vomiting  Post-op Vital Signs: stable  Last Vitals:  Filed Vitals:   07/23/13 1652  BP: 108/67  Pulse: 84  Temp: 36.7 C  Resp: 14    Complications: No apparent anesthesia complications

## 2013-07-24 DIAGNOSIS — T8789 Other complications of amputation stump: Secondary | ICD-10-CM | POA: Diagnosis not present

## 2013-07-24 LAB — GLUCOSE, CAPILLARY: Glucose-Capillary: 169 mg/dL — ABNORMAL HIGH (ref 70–99)

## 2013-07-24 MED ORDER — OXYCODONE-ACETAMINOPHEN 5-325 MG PO TABS: 1.0000 | ORAL_TABLET | ORAL | Status: DC | PRN

## 2013-07-24 NOTE — Care Management Utilization Note (Signed)
UR completed.    Andrienne Havener Wise Aundraya Dripps, RN, BSN Phone #[phone]  

## 2013-07-24 NOTE — Discharge Summary (Signed)
Physician Discharge Summary  Patient ID: Jack [name] MRN: [id number] DOB/AGE: [birth year] 70 y.o.  Admit date: 07/23/2013 Discharge date: 07/24/2013  Admission Diagnoses: Dehiscence transtibial amputation  Discharge Diagnoses:  Active Problems:   Dehiscence of amputation stump   Discharged Condition: stable  Hospital Course: Patient's hospital course was essentially unremarkable. He underwent revision transtibial amputation. Postoperatively patient progressed well and was discharged to home in stable condition.  Consults: None  Significant Diagnostic Studies: labs: Routine labs  Treatments: surgery: See operative note  Discharge Exam: Blood pressure 113/61, pulse 66, temperature 97.8 F (36.6 C), temperature source Oral, resp. rate 18, height 5\' 10"  (1.778 m), weight 64 kg (141 lb 1.5 oz), SpO2 100.00%. Incision/Wound: dressing clean dry and intact  Disposition: 06-Home-Health Care Svc  Discharge Instructions   Call MD / Call 911    Complete by:  As directed   If you experience chest pain or shortness of breath, CALL 911 and be transported to the hospital emergency room.  If you develope a fever above 101 F, pus (white drainage) or increased drainage or redness at the wound, or calf pain, call your surgeon's office.     Constipation Prevention    Complete by:  As directed   Drink plenty of fluids.  Prune juice may be helpful.  You may use a stool softener, such as Colace (over the counter) 100 mg twice a day.  Use MiraLax (over the counter) for constipation as needed.     Diet - low sodium heart healthy    Complete by:  As directed      Increase activity slowly as tolerated    Complete by:  As directed             Medication List         aspirin EC 81 MG tablet  Take 81 mg by mouth daily.     multivitamin Tabs tablet  Take 1 tablet by mouth at bedtime.     b complex-vitamin c-folic acid 0.8 MG Tabs tablet  Take 1 tablet by mouth daily.     calcium acetate  667 MG capsule  Commonly known as:  PHOSLO  Take 1,334 mg by mouth 3 (three) times daily with meals.     colchicine 0.6 MG tablet  Take 0.6 mg by mouth daily.     feeding supplement (RESOURCE BREEZE) Liqd  Take 1 Container by mouth 3 (three) times daily between meals.     gabapentin 100 MG capsule  Commonly known as:  NEURONTIN  Take 100 mg by mouth at bedtime.     lanthanum 1000 MG chewable tablet  Commonly known as:  FOSRENOL  Chew 1,000 mg by mouth 3 (three) times daily with meals.     levothyroxine 175 MCG tablet  Commonly known as:  SYNTHROID, LEVOTHROID  Take 175 mcg by mouth daily before breakfast.     Oxycodone HCl 10 MG Tabs  Take 5-10 mg by mouth every 6 (six) hours as needed (for pain).     oxyCODONE-acetaminophen 5-325 MG per tablet  Commonly known as:  ROXICET  Take 1 tablet by mouth every 4 (four) hours as needed for severe pain.           Follow-up Information   Follow up with Robyne Matar V, MD In 1 week.   Specialty:  Orthopedic Surgery   Contact information:   [place] [address] [phone]       Signed: Newt Minion 07/24/2013, 7:57 AM

## 2013-07-24 NOTE — Care Management Note (Signed)
    Page 1 of 1   07/24/2013     3:31:05 PM CARE MANAGEMENT NOTE 07/24/2013  Patient:  Jack [name], Jack [name]   Account Number:  [id number]  Date Initiated:  07/24/2013  Documentation initiated by:  [facility]  Subjective/Objective Assessment:   adm: Dehiscence of amputation stump     Action/Plan:   discharge planning   Anticipated DC Date:  07/24/2013   Anticipated DC Plan:  [place]  CM consult      Choice offered to / List presented to:             Status of service:   Medicare Important Message given?   (If response is "NO", the following Medicare IM given date fields will be blank) Date Medicare IM given:   Medicare IM given by:   Date Additional Medicare IM given:   Additional Medicare IM given by:    Discharge Disposition:  HOME/SELF CARE  Per UR Regulation:    If discussed at Long Length of Stay Meetings, dates discussed:    Comments:  07/24/13 08:30 CM waiting for PT/OT Eval.  DC summary written.  Pt states he doe not require any home health.  No other CM needs were communicated.  No HH ORDERS.  Mariane Masters, BSN, CM [phone].

## 2013-07-26 ENCOUNTER — Encounter (HOSPITAL_COMMUNITY): Payer: Self-pay | Admitting: Orthopedic Surgery

## 2013-07-30 ENCOUNTER — Telehealth: Payer: Self-pay | Admitting: *Deleted

## 2013-07-30 NOTE — Telephone Encounter (Signed)
Jack [name] has dialysis M-W-F so he cannot keep appt Monday 08/02/13. Dena notified.

## 2013-07-31 ENCOUNTER — Other Ambulatory Visit (HOSPITAL_COMMUNITY): Payer: Self-pay | Admitting: Physical Medicine and Rehabilitation

## 2013-08-02 ENCOUNTER — Encounter: Payer: Medicare Other | Attending: Physical Medicine & Rehabilitation | Admitting: Physical Medicine & Rehabilitation

## 2013-08-04 ENCOUNTER — Encounter: Payer: Self-pay | Admitting: Infectious Disease

## 2013-08-04 ENCOUNTER — Ambulatory Visit (INDEPENDENT_AMBULATORY_CARE_PROVIDER_SITE_OTHER): Payer: Medicare Other | Admitting: Infectious Disease

## 2013-08-04 VITALS — BP 121/80 | HR 96 | Temp 98.0°F | Wt 150.0 lb

## 2013-08-04 DIAGNOSIS — T8740 Infection of amputation stump, unspecified extremity: Secondary | ICD-10-CM

## 2013-08-04 DIAGNOSIS — A4902 Methicillin resistant Staphylococcus aureus infection, unspecified site: Secondary | ICD-10-CM

## 2013-08-04 DIAGNOSIS — Z89512 Acquired absence of left leg below knee: Secondary | ICD-10-CM

## 2013-08-04 DIAGNOSIS — I96 Gangrene, not elsewhere classified: Secondary | ICD-10-CM

## 2013-08-04 DIAGNOSIS — T8743 Infection of amputation stump, right lower extremity: Secondary | ICD-10-CM

## 2013-08-04 DIAGNOSIS — S88119A Complete traumatic amputation at level between knee and ankle, unspecified lower leg, initial encounter: Secondary | ICD-10-CM

## 2013-08-04 DIAGNOSIS — R7881 Bacteremia: Secondary | ICD-10-CM

## 2013-08-04 DIAGNOSIS — B9562 Methicillin resistant Staphylococcus aureus infection as the cause of diseases classified elsewhere: Secondary | ICD-10-CM

## 2013-08-04 MED ORDER — DOXYCYCLINE HYCLATE 100 MG PO TABS: 100.0000 mg | ORAL_TABLET | Freq: Two times a day (BID) | ORAL | Status: DC

## 2013-08-04 NOTE — Progress Notes (Signed)
   Subjective:    Patient ID: Jack [name], male    DOB: Nov 02, 1945, 68 y.o.   MRN: [id number]  HPI   68 y.o. male with Multiple myeloma, DM, ESRD on HD via AV fistula with gangrrene in left  foot sp 1st amputation on March 25th,  readmitted forin April with MRSA bacteremia and gangrene in left foot sp I and D by VVS, then Trans-metatarsal amputation by Dr. Sharol Given that ultimately failed to control infection in that foot and required a BKA on May 1st, 2015. He had been receiving IV vancomycin with HD since then and had  received more than 6 weeks of IV vancomycin since clearance of his bacteremia and has had 18 days postoperative vancomycin since his curative BKA. Unfortunately since I saw him in May his BKA site failed to heal up and he required further revision of the BKA amputation by Dr. Sharol Given early in July.      Review of Systems  Constitutional: Positive for activity change. Negative for fever, chills, diaphoresis, appetite change, fatigue and unexpected weight change.  HENT: Negative for congestion, sinus pressure and sneezing.   Eyes: Negative for photophobia and visual disturbance.  Respiratory: Negative for cough.   Gastrointestinal: Negative.  Negative for nausea, vomiting and diarrhea.  Genitourinary: Negative for dysuria, hematuria, flank pain and difficulty urinating.  Musculoskeletal: Positive for myalgias. Negative for arthralgias, back pain, gait problem and joint swelling.  Skin: Positive for wound. Negative for color change, pallor and rash.  Neurological: Negative for dizziness and light-headedness.  Hematological: Does not bruise/bleed easily.  Psychiatric/Behavioral: Negative for behavioral problems, confusion, sleep disturbance, dysphoric mood, decreased concentration and agitation.       Objective:   Physical Exam  Nursing note and vitals reviewed. Constitutional: He is oriented to person, place, and time. He appears well-developed and well-nourished. No  distress.  HENT:  Head: Normocephalic.  Eyes: Conjunctivae and EOM are normal.  Neck: Normal range of motion. Neck supple.  Cardiovascular: Normal rate and regular rhythm.   Pulmonary/Chest: Effort normal. No respiratory distress. He has no wheezes.  Abdominal: Soft. He exhibits no distension.  Neurological: He is alert and oriented to person, place, and time.  Skin: Skin is warm and dry. He is not diaphoretic.  Psychiatric: He has a normal mood and affect. His behavior is normal. Judgment and thought content normal.   MSK, skin:  Bka site after revision of education:      Right transmetatarsal site amputation:                 Assessment & Plan:   68 year old with type 1 DM, multiple myeloma with ESRD on HD, PVD who had gangrenous changes requiring amputation of first toe on March 25th, then readmitted with MRSA bacteremia in April progression of infection in his foot requiring I and D by VVS, TMT amputation by Dr. Sharol Given and ultimately BKA by Dr. Sharol Given on 05/14/13. He had TEE that was negative for vegetations and he has received more than 6 weeks of IV vancomycin but now had to have revision of BKA site yet again due to dehiscence of surgical site:  #1 BKA site infection:  Status post revision:  Will start doxycycline and keep him on the sentinel this is healing up more.  #2 MRSA bacteremia: sp 6 weeks from clearance and now sp 28 days since removal of original source with BKA  Should be cured

## 2013-08-31 ENCOUNTER — Encounter (HOSPITAL_BASED_OUTPATIENT_CLINIC_OR_DEPARTMENT_OTHER): Payer: Medicare Other | Attending: General Surgery

## 2013-08-31 DIAGNOSIS — L97409 Non-pressure chronic ulcer of unspecified heel and midfoot with unspecified severity: Secondary | ICD-10-CM | POA: Insufficient documentation

## 2013-08-31 DIAGNOSIS — Y835 Amputation of limb(s) as the cause of abnormal reaction of the patient, or of later complication, without mention of misadventure at the time of the procedure: Secondary | ICD-10-CM | POA: Insufficient documentation

## 2013-08-31 DIAGNOSIS — S88119A Complete traumatic amputation at level between knee and ankle, unspecified lower leg, initial encounter: Secondary | ICD-10-CM | POA: Insufficient documentation

## 2013-08-31 DIAGNOSIS — T8789 Other complications of amputation stump: Secondary | ICD-10-CM | POA: Diagnosis not present

## 2013-08-31 DIAGNOSIS — E1169 Type 2 diabetes mellitus with other specified complication: Secondary | ICD-10-CM | POA: Diagnosis present

## 2013-09-01 NOTE — H&P (Signed)
NAME:  Jack [name], CHARTERS NO.:  [id number]  MEDICAL RECORD NO.:  [id number]  LOCATION:  FOOT                         FACILITY:  [facility]  PHYSICIAN:  Elesa Hacker, M.D.        DATE OF BIRTH:  July 04, 1945  DATE OF ADMISSION:  08/31/2013 DATE OF DISCHARGE:                             HISTORY & PHYSICAL   CHIEF COMPLAINT:  Unhealed left below-knee amputation stump secondary to diabetes and peripheral vascular disease.  HISTORY OF PRESENT ILLNESS:  This is a 68 year old male with diabetes, end-stage renal disease, peripheral vascular disease.  In July after failure of healing of a transmetatarsal amputation, he underwent a below- knee amputation on the left.  This was followed several weeks later by a revision.  The wound is not quite healed, although it is very clean and looks like it will heal without too much difficulty.  PAST MEDICAL HISTORY:  Significant for type 2 diabetes, end-stage renal disease, hypertension, glaucoma, benign prostatic hypertrophy, multiple myeloma, which seems to be in remission, peripheral vascular disease, secondary hyperthyroidism, history of MRSA infection.  PAST SURGICAL HISTORY:  Cataracts, right transmetatarsal amputation, dialysis fistula, anterior cervical discectomy, left BKA and left BKA revision in July of 2015.  SOCIAL HISTORY:  Cigarettes none.  Alcohol none.  MEDICATIONS:  Calcium acetate, doxazosin, gabapentin, levothyroxine, Bystolic, oxycodone, colchicine, and doxycycline orally.  REVIEW OF SYSTEMS:  During the course of previous hospitalizations, the patient was seen by Infectious Disease and was treated with IV vancomycin for 6 weeks.  At that time, he had MRSA infection.  ALLERGIES:  LISINOPRIL, IVP DYE, AND MORPHINE.  PHYSICAL EXAMINATION:  VITAL SIGNS:  Temperature 99.1, pulse 85, respirations 17, blood pressure 159/84.  Glucose is 85. GENERAL APPEARANCE:  Well developed, well nourished. CHEST:  Clear. HEART:   Regular rhythm.  There was a very soft murmur anteriorly.  There is a 2nd intercostal space on the left. EXTREMITIES:  Examination of the left BKA stump reveals a few scabs and 2 areas that are open with dull adherent slough.  IMPRESSION:  Almost healed left below-knee amputation in a patient with multiple factors to delay healing.  PLAN:  We will treat these small wounds with Santyl.  I believe that there is probably skin underneath scabs, I will leave those intact.     Elesa Hacker, M.D.     RA/MEDQ  D:  08/31/2013  T:  08/31/2013  Job:  [id number]  cc:   Newt Minion, MD

## 2013-09-07 DIAGNOSIS — S88119A Complete traumatic amputation at level between knee and ankle, unspecified lower leg, initial encounter: Secondary | ICD-10-CM | POA: Diagnosis not present

## 2013-09-07 DIAGNOSIS — L97409 Non-pressure chronic ulcer of unspecified heel and midfoot with unspecified severity: Secondary | ICD-10-CM | POA: Diagnosis not present

## 2013-09-07 DIAGNOSIS — T8789 Other complications of amputation stump: Secondary | ICD-10-CM | POA: Diagnosis not present

## 2013-09-07 DIAGNOSIS — E1169 Type 2 diabetes mellitus with other specified complication: Secondary | ICD-10-CM | POA: Diagnosis not present

## 2013-09-14 ENCOUNTER — Encounter (HOSPITAL_BASED_OUTPATIENT_CLINIC_OR_DEPARTMENT_OTHER): Payer: Medicare Other | Attending: General Surgery

## 2013-09-14 DIAGNOSIS — Y835 Amputation of limb(s) as the cause of abnormal reaction of the patient, or of later complication, without mention of misadventure at the time of the procedure: Secondary | ICD-10-CM | POA: Diagnosis not present

## 2013-09-14 DIAGNOSIS — L8992 Pressure ulcer of unspecified site, stage 2: Secondary | ICD-10-CM | POA: Diagnosis not present

## 2013-09-14 DIAGNOSIS — T8789 Other complications of amputation stump: Secondary | ICD-10-CM | POA: Diagnosis present

## 2013-09-14 DIAGNOSIS — L89609 Pressure ulcer of unspecified heel, unspecified stage: Secondary | ICD-10-CM | POA: Diagnosis not present

## 2013-09-21 ENCOUNTER — Ambulatory Visit (INDEPENDENT_AMBULATORY_CARE_PROVIDER_SITE_OTHER): Payer: Medicare Other | Admitting: Internal Medicine

## 2013-09-21 ENCOUNTER — Encounter: Payer: Self-pay | Admitting: Internal Medicine

## 2013-09-21 VITALS — BP 124/80 | HR 92 | Temp 98.3°F | Ht 70.0 in | Wt 167.5 lb

## 2013-09-21 DIAGNOSIS — Z8614 Personal history of Methicillin resistant Staphylococcus aureus infection: Secondary | ICD-10-CM

## 2013-09-21 DIAGNOSIS — Z23 Encounter for immunization: Secondary | ICD-10-CM

## 2013-09-21 LAB — C-REACTIVE PROTEIN: CRP: 4 mg/dL — ABNORMAL HIGH (ref <0.60)

## 2013-09-21 NOTE — Progress Notes (Addendum)
Patient ID: Jack [name], male   DOB: 1945-10-05, 68 y.o.   MRN: [id number]         [facility] for Infectious Disease  Patient Active Problem List   Diagnosis Date Noted  . Dehiscence of amputation stump 07/23/2013  . Type 1 diabetes mellitus with diabetic foot infection   . Diabetes mellitus with peripheral vascular disease   . ESRD on hemodialysis   . BKA stump complication 43/32/9518  . Asthma 05/17/2013  . S/P BKA (below knee amputation) unilateral 05/17/2013  . FTT (failure to thrive) in adult 05/01/2013  . History of MRSA infection 04/22/2013  . Peripheral vascular disease, unspecified 11/19/2012  . End-stage renal disease on hemodialysis 05/05/2012  . End stage kidney disease 05/05/2012  . Kahler disease 03/30/2012  . Anemia in chronic kidney disease 03/19/2012  . Iron deficiency 03/19/2012  . Hypothyroidism 03/19/2012  . Anemia, iron deficiency 03/19/2012  . Hypertension 03/18/2012  . Chronic kidney disease (CKD), stage V 03/18/2012  . Benign hypertension 03/18/2012  . Cardiac conduction disorder 03/18/2012  . Insulin dependent type 1 diabetes mellitus 03/18/2012  . MGUS (monoclonal gammopathy of unknown significance) 02/28/2011  . Monoclonal paraproteinemia 02/28/2011    Patient's Medications  New Prescriptions   No medications on file  Previous Medications   ASPIRIN EC 81 MG TABLET    Take 81 mg by mouth daily.   B COMPLEX-VITAMIN C-FOLIC ACID (NEPHRO-VITE) 0.8 MG TABS TABLET    Take 1 tablet by mouth daily.   CALCIUM ACETATE (PHOSLO) 667 MG CAPSULE    Take 1,334 mg by mouth 3 (three) times daily with meals.   COLCHICINE 0.6 MG TABLET    Take 0.6 mg by mouth daily.   FEEDING SUPPLEMENT, RESOURCE BREEZE, (RESOURCE BREEZE) LIQD    Take 1 Container by mouth 3 (three) times daily between meals.   GABAPENTIN (NEURONTIN) 100 MG CAPSULE    Take 100 mg by mouth at bedtime.   GABAPENTIN (NEURONTIN) 100 MG CAPSULE    TAKE 1 CAPSULE (100 MG TOTAL) BY MOUTH AT  BEDTIME.   LANTHANUM (FOSRENOL) 1000 MG CHEWABLE TABLET    Chew 1,000 mg by mouth 3 (three) times daily with meals.   LEVOTHYROXINE (SYNTHROID, LEVOTHROID) 175 MCG TABLET    Take 175 mcg by mouth daily before breakfast.   MULTIVITAMIN (RENA-VIT) TABS TABLET    Take 1 tablet by mouth at bedtime.   OXYCODONE HCL 10 MG TABS    Take 5-10 mg by mouth every 6 (six) hours as needed (for pain).   OXYCODONE-ACETAMINOPHEN (ROXICET) 5-325 MG PER TABLET    Take 1 tablet by mouth every 4 (four) hours as needed for severe pain.  Modified Medications   No medications on file  Discontinued Medications   DOXYCYCLINE (VIBRA-TABS) 100 MG TABLET    Take 1 tablet (100 mg total) by mouth 2 (two) times daily.    Subjective: Mr. Prestage is in for his routine followup visit. He has been followed by my partner, Dr. Tommy Medal, for MRSA left foot infection complicated by bacteremia. He completed a little over 6 weeks of IV vancomycin on July 22 and was transitioned to oral doxycycline which he continues to take. He has had no problems tolerating doxycycline. His left BKA stump is healing nicely. Review of Systems: Pertinent items are noted in HPI.  Past Medical History  Diagnosis Date  . Hypertension   . Pneumonia     2012  . Heart murmur   . Glaucoma   .  Multiple myeloma, without mention of having achieved remission 03/30/2012    Cytogenetic neg on 03/23/2012.  . Asthma   . Hyperparathyroidism, secondary renal   . Peripheral arterial disease   . End-stage renal disease on hemodialysis     Started HD March 2014.  Cause of ESRD was DM.  Gets HD at [facility] on [place] on MWF schedule.   . Thyroid disease     hyperparathyroidism  . MRSA bacteremia   . Anemia   . Peripheral vascular disease, unspecified 11/19/2012    In the past had R foot toe amps then R TMA. In 2015 had left foot toe amp > then TMA >then L BKA on 05/14/13   . History of MRSA infection 04/22/2013    Bacteremia assoc w L foot wound infection Mar  2015   . Gangrene of foot   . ESRD on hemodialysis   . Diabetes mellitus without complication     Type 2  . Diabetes mellitus with peripheral vascular disease     History  Substance Use Topics  . Smoking status: Former Smoker    Types: Cigarettes    Quit date: 03/19/1982  . Smokeless tobacco: Never Used  . Alcohol Use: No     Comment: formerly    Family History  Problem Relation Age of Onset  . Diabetes Mother   . Cancer Mother     bone   . Kidney disease Father     Allergies  Allergen Reactions  . Bee Venom Anaphylaxis  . Lisinopril Cough  . Ivp Dye [Iodinated Diagnostic Agents] Hives  . Morphine And Related Other (See Comments)    Bradycardia states patient    Objective: Temp: 98.3 F (36.8 C) (09/08 1134) Temp src: Oral (09/08 1134) BP: 124/80 mmHg (09/08 1134) Pulse Rate: 92 (09/08 1134)  General: He is in good spirits seated in his wheelchair Left BKA: There are a few small scabbed areas along his incision line but no evidence of active infection.  Sed Rate (mm/hr)  Date Value  02/24/2013 20*  01/09/2013 86*  12/15/2012 80*     Assessment: He is doing well. I will repeat his inflammatory markers and consider stopping doxycycline soon.  Plan: 1. Check sedimentation rate and C-reactive protein   Michel Bickers, MD [facility] for Infectious Disease [place] [phone] pager   [phone] cell 09/21/2013, 12:01 PM  Addendum:  Sed Rate (mm/hr)  Date Value  09/21/2013 28*  02/24/2013 20*  01/09/2013 86*     CRP (mg/dL)  Date Value  09/21/2013 4.0*   Although his inflammatory markers remain slightly elevated his left BKA stump does not show any evidence of active infection following 3 months of antibiotic therapy. I discussed options with him and we both agree that it is best to stop doxycycline now.  Michel Bickers, MD [facility] [phone] pager   [phone] cell 09/22/2013, 2:40  PM

## 2013-09-22 LAB — SEDIMENTATION RATE: SED RATE: 28 mm/h — AB (ref 0–16)

## 2013-09-28 DIAGNOSIS — T8789 Other complications of amputation stump: Secondary | ICD-10-CM | POA: Diagnosis not present

## 2013-09-28 DIAGNOSIS — L8992 Pressure ulcer of unspecified site, stage 2: Secondary | ICD-10-CM | POA: Diagnosis not present

## 2013-09-28 DIAGNOSIS — L89609 Pressure ulcer of unspecified heel, unspecified stage: Secondary | ICD-10-CM | POA: Diagnosis not present

## 2013-10-11 ENCOUNTER — Other Ambulatory Visit (HOSPITAL_COMMUNITY): Payer: Self-pay | Admitting: Physical Medicine & Rehabilitation

## 2013-10-12 DIAGNOSIS — L89609 Pressure ulcer of unspecified heel, unspecified stage: Secondary | ICD-10-CM | POA: Diagnosis not present

## 2013-10-12 DIAGNOSIS — L8992 Pressure ulcer of unspecified site, stage 2: Secondary | ICD-10-CM | POA: Diagnosis not present

## 2013-10-12 DIAGNOSIS — T8789 Other complications of amputation stump: Secondary | ICD-10-CM | POA: Diagnosis not present

## 2013-10-19 ENCOUNTER — Encounter (HOSPITAL_BASED_OUTPATIENT_CLINIC_OR_DEPARTMENT_OTHER): Payer: Medicare Other | Attending: General Surgery

## 2013-10-19 DIAGNOSIS — Z89512 Acquired absence of left leg below knee: Secondary | ICD-10-CM | POA: Diagnosis not present

## 2013-10-19 DIAGNOSIS — L89612 Pressure ulcer of right heel, stage 2: Secondary | ICD-10-CM | POA: Diagnosis present

## 2013-10-26 DIAGNOSIS — Z89512 Acquired absence of left leg below knee: Secondary | ICD-10-CM | POA: Diagnosis not present

## 2013-10-26 DIAGNOSIS — L89612 Pressure ulcer of right heel, stage 2: Secondary | ICD-10-CM | POA: Diagnosis not present

## 2013-11-02 DIAGNOSIS — L89612 Pressure ulcer of right heel, stage 2: Secondary | ICD-10-CM | POA: Diagnosis not present

## 2013-11-02 DIAGNOSIS — Z89512 Acquired absence of left leg below knee: Secondary | ICD-10-CM | POA: Diagnosis not present

## 2013-11-03 ENCOUNTER — Other Ambulatory Visit: Payer: Self-pay | Admitting: *Deleted

## 2013-11-03 ENCOUNTER — Encounter: Payer: Self-pay | Admitting: Vascular Surgery

## 2013-11-03 DIAGNOSIS — T82510A Breakdown (mechanical) of surgically created arteriovenous fistula, initial encounter: Secondary | ICD-10-CM

## 2013-11-03 DIAGNOSIS — N186 End stage renal disease: Secondary | ICD-10-CM

## 2013-11-09 DIAGNOSIS — L89612 Pressure ulcer of right heel, stage 2: Secondary | ICD-10-CM | POA: Diagnosis not present

## 2013-11-09 DIAGNOSIS — Z89512 Acquired absence of left leg below knee: Secondary | ICD-10-CM | POA: Diagnosis not present

## 2013-11-15 ENCOUNTER — Encounter: Payer: Self-pay | Admitting: Vascular Surgery

## 2013-11-16 ENCOUNTER — Encounter: Payer: Self-pay | Admitting: Vascular Surgery

## 2013-11-16 ENCOUNTER — Ambulatory Visit (INDEPENDENT_AMBULATORY_CARE_PROVIDER_SITE_OTHER): Payer: Medicare Other | Admitting: Vascular Surgery

## 2013-11-16 ENCOUNTER — Encounter (HOSPITAL_BASED_OUTPATIENT_CLINIC_OR_DEPARTMENT_OTHER): Payer: Medicare Other | Attending: General Surgery

## 2013-11-16 ENCOUNTER — Ambulatory Visit (HOSPITAL_COMMUNITY)
Admission: RE | Admit: 2013-11-16 | Discharge: 2013-11-16 | Disposition: A | Discharge status: Home / Self Care | Payer: Medicare Other | Source: Ambulatory Visit | Attending: Vascular Surgery | Admitting: Vascular Surgery

## 2013-11-16 VITALS — BP 129/84 | HR 79 | Resp 14 | Ht 70.0 in | Wt 170.0 lb

## 2013-11-16 DIAGNOSIS — L89612 Pressure ulcer of right heel, stage 2: Secondary | ICD-10-CM | POA: Insufficient documentation

## 2013-11-16 DIAGNOSIS — T82510A Breakdown (mechanical) of surgically created arteriovenous fistula, initial encounter: Secondary | ICD-10-CM

## 2013-11-16 DIAGNOSIS — N186 End stage renal disease: Secondary | ICD-10-CM

## 2013-11-16 DIAGNOSIS — E119 Type 2 diabetes mellitus without complications: Secondary | ICD-10-CM | POA: Insufficient documentation

## 2013-11-16 DIAGNOSIS — Z87891 Personal history of nicotine dependence: Secondary | ICD-10-CM | POA: Diagnosis not present

## 2013-11-16 DIAGNOSIS — I739 Peripheral vascular disease, unspecified: Secondary | ICD-10-CM | POA: Insufficient documentation

## 2013-11-16 NOTE — Progress Notes (Signed)
Subjective:     Patient ID: Jack [name], male   DOB: 1946/01/08, 68 y.o.   MRN: [id number]  HPIthis 68 year old male with end-stage renal disease has been on hemodialysis since March 2014. He has a left brachial-cephalic AV fistula which was created shortly before that time. He states that the left upper extremity was asymptomatic until 3 months ago. He developed some numbness and occasional discomfort which was not severe in the left hand. He has noticed some weakness in the left hand and was referred today for evaluation for possible steal syndrome.  Past Medical History  Diagnosis Date  . Hypertension   . Pneumonia     2012  . Heart murmur   . Glaucoma   . Multiple myeloma, without mention of having achieved remission 03/30/2012    Cytogenetic neg on 03/23/2012.  . Asthma   . Hyperparathyroidism, secondary renal   . Peripheral arterial disease   . End-stage renal disease on hemodialysis     Started HD March 2014.  Cause of ESRD was DM.  Gets HD at [facility] on [place] on MWF schedule.   . Thyroid disease     hyperparathyroidism  . MRSA bacteremia   . Anemia   . Peripheral vascular disease, unspecified 11/19/2012    In the past had R foot toe amps then R TMA. In 2015 had left foot toe amp > then TMA >then L BKA on 05/14/13   . History of MRSA infection 04/22/2013    Bacteremia assoc w L foot wound infection Mar 2015   . Gangrene of foot   . ESRD on hemodialysis   . Diabetes mellitus without complication     Type 2  . Diabetes mellitus with peripheral vascular disease     History  Substance Use Topics  . Smoking status: Former Smoker    Types: Cigarettes    Quit date: 03/19/1982  . Smokeless tobacco: Never Used  . Alcohol Use: No     Comment: formerly    Family History  Problem Relation Age of Onset  . Diabetes Mother   . Cancer Mother     bone   . Kidney disease Father     Allergies  Allergen Reactions  . Bee Venom Anaphylaxis  . Lisinopril Cough  . Ivp Dye  [Iodinated Diagnostic Agents] Hives  . Morphine And Related Other (See Comments)    Bradycardia states patient    Current outpatient prescriptions: aspirin EC 81 MG tablet, Take 81 mg by mouth daily., Disp: , Rfl: ;  b complex-vitamin c-folic acid (NEPHRO-VITE) 0.8 MG TABS tablet, Take 1 tablet by mouth daily., Disp: , Rfl: ;  calcium acetate (PHOSLO) 667 MG capsule, Take 1,334 mg by mouth 3 (three) times daily with meals., Disp: , Rfl: ;  colchicine 0.6 MG tablet, Take 0.6 mg by mouth daily., Disp: , Rfl:  feeding supplement, RESOURCE BREEZE, (RESOURCE BREEZE) LIQD, Take 1 Container by mouth 3 (three) times daily between meals., Disp: , Rfl: ;  gabapentin (NEURONTIN) 100 MG capsule, Take 100 mg by mouth at bedtime., Disp: , Rfl: ;  gabapentin (NEURONTIN) 100 MG capsule, TAKE 1 CAPSULE (100 MG TOTAL) BY MOUTH AT BEDTIME., Disp: 30 capsule, Rfl: 1 lanthanum (FOSRENOL) 1000 MG chewable tablet, Chew 1,000 mg by mouth 3 (three) times daily with meals., Disp: , Rfl: ;  levothyroxine (SYNTHROID, LEVOTHROID) 175 MCG tablet, Take 175 mcg by mouth daily before breakfast., Disp: , Rfl: ;  multivitamin (RENA-VIT) TABS tablet, Take 1 tablet by  mouth at bedtime., Disp: , Rfl: ;  Oxycodone HCl 10 MG TABS, Take 5-10 mg by mouth every 6 (six) hours as needed (for pain)., Disp: , Rfl:  oxyCODONE-acetaminophen (ROXICET) 5-325 MG per tablet, Take 1 tablet by mouth every 4 (four) hours as needed for severe pain., Disp: 60 tablet, Rfl: 0  BP 129/84 mmHg  Pulse 79  Resp 14  Ht _0  (1.778 m)  Wt 170 lb (77.111 kg)  BMI 24.39 kg/m2  Body mass index is 24.39 kg/(m^2).           Review of SystemsDenies chest pain, dyspnea on exertion, PND, orthopnea.     Objective:   Physical Exam BP 129/84 mmHg  Pulse 79  Resp 14  Ht _1  (1.778 m)  Wt 170 lb (77.111 kg)  BMI 24.39 kg/m2  Gen. Well-developed well-nourished male in no apparent distress alert and oriented 3 Lungs no rhonchi or wheezing Left upper  extremity with good pulse and palpable thrill and upper arm brachial-cephalic AV fistula. 1+ radial pulse palpable left hand  Which increases to 2-3+ with compression of the fistula. There is some muscle wasting in the hypo-thenar eminence. No pain to compression of the fingers and slight decreased sensation of the left hand.  Today we performed a duplex evaluation of the left upper extremity. There is indication of steal syndrome with waveforms improving to antegrade and triphasic with compression of the fistula. The blood vessels are noncompressible at rest.     Assessment:     Steal syndrome left upper extremity with functioning brachial-cephalic AV fistula in patient with end-stage renal disease Fistula has been present for over 18 months and symptoms started 3 months ago according to patient Discussed options of ligation of the fistula with creation of fistula and contralateral right arm which is his dominant arm. Other option would be thought graft with ligation of fistula. Patient does not want to proceed with any of this at the present time states he would rather follow this along and see how it does. I cautioned him to watch for evidence of ischemia of the fingers and that if his symptoms worsened or if he change his mind we would be happy to schedule him for ligation of the left arm fistula and other access    Plan:     He will return to see [place] on a when necessary basis. He will notify us if he should decide he wants to have the fistula ligated and proceed with other access

## 2013-11-23 DIAGNOSIS — I739 Peripheral vascular disease, unspecified: Secondary | ICD-10-CM | POA: Diagnosis not present

## 2013-11-23 DIAGNOSIS — L89612 Pressure ulcer of right heel, stage 2: Secondary | ICD-10-CM | POA: Diagnosis not present

## 2013-11-30 DIAGNOSIS — L89612 Pressure ulcer of right heel, stage 2: Secondary | ICD-10-CM | POA: Diagnosis not present

## 2013-11-30 DIAGNOSIS — I739 Peripheral vascular disease, unspecified: Secondary | ICD-10-CM | POA: Diagnosis not present

## 2013-12-07 DIAGNOSIS — L89612 Pressure ulcer of right heel, stage 2: Secondary | ICD-10-CM | POA: Diagnosis not present

## 2013-12-07 DIAGNOSIS — I739 Peripheral vascular disease, unspecified: Secondary | ICD-10-CM | POA: Diagnosis not present

## 2013-12-11 ENCOUNTER — Other Ambulatory Visit (HOSPITAL_BASED_OUTPATIENT_CLINIC_OR_DEPARTMENT_OTHER): Payer: Self-pay | Admitting: General Surgery

## 2013-12-11 ENCOUNTER — Ambulatory Visit (HOSPITAL_COMMUNITY)
Admission: RE | Admit: 2013-12-11 | Discharge: 2013-12-11 | Disposition: A | Discharge status: Home / Self Care | Payer: Medicare Other | Source: Ambulatory Visit | Attending: General Surgery | Admitting: General Surgery

## 2013-12-11 DIAGNOSIS — I7090 Unspecified atherosclerosis: Secondary | ICD-10-CM | POA: Insufficient documentation

## 2013-12-11 DIAGNOSIS — S91301A Unspecified open wound, right foot, initial encounter: Secondary | ICD-10-CM | POA: Insufficient documentation

## 2013-12-11 DIAGNOSIS — X58XXXA Exposure to other specified factors, initial encounter: Secondary | ICD-10-CM | POA: Insufficient documentation

## 2013-12-11 DIAGNOSIS — S91301S Unspecified open wound, right foot, sequela: Secondary | ICD-10-CM

## 2013-12-11 DIAGNOSIS — M7989 Other specified soft tissue disorders: Secondary | ICD-10-CM | POA: Insufficient documentation

## 2013-12-14 ENCOUNTER — Encounter (HOSPITAL_BASED_OUTPATIENT_CLINIC_OR_DEPARTMENT_OTHER): Payer: Medicare Other | Attending: General Surgery

## 2013-12-14 ENCOUNTER — Other Ambulatory Visit (HOSPITAL_BASED_OUTPATIENT_CLINIC_OR_DEPARTMENT_OTHER): Payer: Self-pay | Admitting: General Surgery

## 2013-12-14 ENCOUNTER — Encounter: Payer: Self-pay | Admitting: Physical Therapy

## 2013-12-14 ENCOUNTER — Ambulatory Visit: Payer: Medicare Other | Attending: Orthopedic Surgery | Admitting: Physical Therapy

## 2013-12-14 ENCOUNTER — Encounter: Payer: Self-pay | Admitting: Physical Medicine & Rehabilitation

## 2013-12-14 ENCOUNTER — Encounter: Payer: Medicare Other | Attending: Physical Medicine & Rehabilitation | Admitting: Physical Medicine & Rehabilitation

## 2013-12-14 VITALS — BP 133/110 | HR 94

## 2013-12-14 VITALS — BP 108/51 | HR 83 | Resp 14

## 2013-12-14 DIAGNOSIS — Z760 Encounter for issue of repeat prescription: Secondary | ICD-10-CM | POA: Diagnosis not present

## 2013-12-14 DIAGNOSIS — Z89431 Acquired absence of right foot: Secondary | ICD-10-CM | POA: Diagnosis not present

## 2013-12-14 DIAGNOSIS — Z89512 Acquired absence of left leg below knee: Secondary | ICD-10-CM | POA: Diagnosis not present

## 2013-12-14 DIAGNOSIS — M25669 Stiffness of unspecified knee, not elsewhere classified: Secondary | ICD-10-CM

## 2013-12-14 DIAGNOSIS — I120 Hypertensive chronic kidney disease with stage 5 chronic kidney disease or end stage renal disease: Secondary | ICD-10-CM | POA: Insufficient documentation

## 2013-12-14 DIAGNOSIS — R269 Unspecified abnormalities of gait and mobility: Secondary | ICD-10-CM | POA: Diagnosis not present

## 2013-12-14 DIAGNOSIS — L97419 Non-pressure chronic ulcer of right heel and midfoot with unspecified severity: Secondary | ICD-10-CM | POA: Insufficient documentation

## 2013-12-14 DIAGNOSIS — M79605 Pain in left leg: Secondary | ICD-10-CM | POA: Diagnosis present

## 2013-12-14 DIAGNOSIS — R6889 Other general symptoms and signs: Secondary | ICD-10-CM | POA: Insufficient documentation

## 2013-12-14 DIAGNOSIS — N186 End stage renal disease: Secondary | ICD-10-CM | POA: Diagnosis not present

## 2013-12-14 DIAGNOSIS — I10 Essential (primary) hypertension: Secondary | ICD-10-CM | POA: Insufficient documentation

## 2013-12-14 DIAGNOSIS — E1151 Type 2 diabetes mellitus with diabetic peripheral angiopathy without gangrene: Secondary | ICD-10-CM | POA: Diagnosis not present

## 2013-12-14 DIAGNOSIS — E11621 Type 2 diabetes mellitus with foot ulcer: Secondary | ICD-10-CM | POA: Insufficient documentation

## 2013-12-14 DIAGNOSIS — Z87891 Personal history of nicotine dependence: Secondary | ICD-10-CM | POA: Insufficient documentation

## 2013-12-14 DIAGNOSIS — Z992 Dependence on renal dialysis: Secondary | ICD-10-CM | POA: Diagnosis not present

## 2013-12-14 DIAGNOSIS — H469 Unspecified optic neuritis: Secondary | ICD-10-CM | POA: Insufficient documentation

## 2013-12-14 DIAGNOSIS — L97519 Non-pressure chronic ulcer of other part of right foot with unspecified severity: Secondary | ICD-10-CM

## 2013-12-14 DIAGNOSIS — E1140 Type 2 diabetes mellitus with diabetic neuropathy, unspecified: Secondary | ICD-10-CM

## 2013-12-14 DIAGNOSIS — F418 Other specified anxiety disorders: Secondary | ICD-10-CM | POA: Insufficient documentation

## 2013-12-14 DIAGNOSIS — Z79899 Other long term (current) drug therapy: Secondary | ICD-10-CM | POA: Insufficient documentation

## 2013-12-14 DIAGNOSIS — R29898 Other symptoms and signs involving the musculoskeletal system: Secondary | ICD-10-CM | POA: Diagnosis not present

## 2013-12-14 DIAGNOSIS — G546 Phantom limb syndrome with pain: Secondary | ICD-10-CM

## 2013-12-14 DIAGNOSIS — Z7982 Long term (current) use of aspirin: Secondary | ICD-10-CM | POA: Insufficient documentation

## 2013-12-14 DIAGNOSIS — G547 Phantom limb syndrome without pain: Secondary | ICD-10-CM

## 2013-12-14 DIAGNOSIS — G629 Polyneuropathy, unspecified: Secondary | ICD-10-CM | POA: Insufficient documentation

## 2013-12-14 MED ORDER — OXYCODONE-ACETAMINOPHEN 5-325 MG PO TABS: 1.0000 | ORAL_TABLET | Freq: Three times a day (TID) | ORAL | Status: DC | PRN

## 2013-12-14 MED ORDER — GABAPENTIN 100 MG PO CAPS: 200.0000 mg | ORAL_CAPSULE | Freq: Every day | ORAL | Status: DC

## 2013-12-14 NOTE — Therapy (Signed)
[facility] [address] [place], [place], [address] Phone: [phone]   Fax:  [phone]  Physical Therapy Evaluation  Patient Details  Name: Jack [name] MRN: [id number] Date of Birth: 02/14/1945  Encounter Date: 12/14/2013      PT End of Session - 12/14/13 1640    Visit Number 1   Number of Visits 18   Date for PT Re-Evaluation 02/11/14   PT Start Time 0846   PT Stop Time 0935   PT Time Calculation (min) 49 min   Equipment Utilized During Treatment Gait belt;Other (comment)  prostheses   Activity Tolerance Patient tolerated treatment well;Patient limited by fatigue   Behavior During Therapy [facility] for tasks assessed/performed      Past Medical History  Diagnosis Date  . Hypertension   . Pneumonia     2012  . Heart murmur   . Glaucoma   . Multiple myeloma, without mention of having achieved remission 03/30/2012    Cytogenetic neg on 03/23/2012.  . Asthma   . Hyperparathyroidism, secondary renal   . Peripheral arterial disease   . End-stage renal disease on hemodialysis     Started HD March 2014.  Cause of ESRD was DM.  Gets HD at [facility] on [address] on MWF schedule.   . Thyroid disease     hyperparathyroidism  . MRSA bacteremia   . Anemia   . Peripheral vascular disease, unspecified 11/19/2012    In the past had R foot toe amps then R TMA. In 2015 had left foot toe amp > then TMA >then L BKA on 05/14/13   . History of MRSA infection 04/22/2013    Bacteremia assoc w L foot wound infection Mar 2015   . Gangrene of foot   . ESRD on hemodialysis   . Diabetes mellitus without complication     Type 2  . Diabetes mellitus with peripheral vascular disease     Past Surgical History  Procedure Laterality Date  . Thyroidectomy    . Cervical disc surgery    . Insertion of dialysis catheter Right 03/19/2012    Procedure: INSERTION OF DIALYSIS CATHETER;  Surgeon: Mal Misty, MD;  Location: [place];  Service: Vascular;   Laterality: Right;  Right Internal Jugular  . Av fistula placement Left 03/25/2012    Procedure: ARTERIOVENOUS (AV) FISTULA CREATION;  Surgeon: Mal Misty, MD;  Location: [name];  Service: Vascular;  Laterality: Left;  . Ligation of competing branches of arteriovenous fistula Left 05/08/2012    Procedure: LIGATION OF COMPETING BRANCHES OF ARTERIOVENOUS FISTULA;  Surgeon: Mal Misty, MD;  Location: [place];  Service: Vascular;  Laterality: Left;  Ultrasound guided  . Cardiac catheterization      approx 30 years ago  . Amputation Right 11/10/2012    Procedure: AMPUTATION FIRST and SECOND TOES Right Foot;  Surgeon: Elam Dutch, MD;  Location: [facility] OR;  Service: Vascular;  Laterality: Right;  . Transmetatarsal amputation Left 12/16/2012    Procedure: TRANSMETATARSAL AMPUTATION AND VAC PLACEMENT;  Surgeon: Elam Dutch, MD;  Location: [place];  Service: Vascular;  Laterality: Left;  . Amputation Left 04/07/2013    Procedure: AMPUTATION DIGIT- LEFT 1ST TOE;  Surgeon: Mal Misty, MD;  Location: [place];  Service: Vascular;  Laterality: Left;  . Tee without cardioversion N/A 04/20/2013    Procedure: TRANSESOPHAGEAL ECHOCARDIOGRAM (TEE);  Surgeon: Josue Hector, MD;  Location: [place];  Service: Cardiovascular;  Laterality: N/A;  . I&d  extremity Left 04/22/2013    Procedure: IRRIGATION AND DEBRIDEMENT LEFT FIRST TOE AMPUTATION WOUND ;  Surgeon: Mal Misty, MD;  Location: [place];  Service: Vascular;  Laterality: Left;  . Amputation Left 04/26/2013    Procedure: Left Foot Transmetatarsal Amputation;  Surgeon: Newt Minion, MD;  Location: [place];  Service: Orthopedics;  Laterality: Left;  . Toe amputation      D/C 04-30-13  . Below knee leg amputation Left 05/14/2013    DR [name]  . Amputation Left 05/14/2013    Procedure: AMPUTATION BELOW KNEE;  Surgeon: Newt Minion, MD;  Location: [place];  Service: Orthopedics;  Laterality: Left;  Left Below Knee Amputation  . Eye surgery Bilateral      CATARACTS  . Amputation Left 07/23/2013    Procedure: AMPUTATION BELOW KNEE;  Surgeon: Newt Minion, MD;  Location: [place];  Service: Orthopedics;  Laterality: Left;  Left Below Knee Amputation Revision    BP 133/110 mmHg  Pulse 94  Visit Diagnosis:  Status post below knee amputation of left lower extremity  S/P transmetatarsal amputation of foot, right  Abnormality of gait  Activity intolerance  Decreased ROM of lower extremity      Subjective Assessment - 12/14/13 1648    Symptoms Patient has no complaints of pain or any other issues. Patient recieved his first prosthesis on 11/18/13.          [facility] PT Assessment - 12/14/13 0845    Assessment   Medical Diagnosis L transtibial amputation, R transmetatarsal amputation (07/2013)   Onset Date 07/23/13   Precautions   Precautions Fall   Balance Screen   Has the patient fallen in the past 6 months No   Has the patient had a decrease in activity level because of a fear of falling?  Yes   Is the patient reluctant to leave their home because of a fear of falling?  No  Because he stays in wheelchair out in the [place] Private residence   [place];Other relatives  Brother; daughter (grown) and grandson   Available Help at Discharge Available PRN/intermittently   Type of [place] Access Level entry  must navigate curb from parking lot to apartment   [place] - single point;Walker - 2 wheels;Crutches;Bedside commode;Shower seat;Wheelchair - manual   Prior Function   Level of Independence Independent with basic ADLs;Independent with gait;Independent with transfers   Vocation Retired   Observation/Other Assessments   Focus on Therapeutic Outcomes (FOTO)  16  Functional Status   Posture/Postural Control   Posture/Postural Control Postural limitations   Postural Limitations Rounded Shoulders;Forward head   AROM   Right  Knee Extension -18   Left Knee Extension -26   Right Ankle Dorsiflexion -4   PROM   Left Knee Extension -24   Strength   Overall Strength Deficits  Tested in sitting, hip flexion 4/5   Transfers   Transfers Sit to Stand;Stand to Sit   Sit to Stand 4: Min assist;With upper extremity assist;With armrests;Other/comment  from w/c to walker   Sit to Stand Details (indicate cue type and reason) to arise and to stabilize   Stand to Sit 4: Min guard;With upper extremity assist;With armrests   Stand to Sit Details use of hands to control descent   Ambulation/Gait   Ambulation/Gait Yes   Ambulation/Gait Assistance Details Patient ambulated with RW and min assist  on prosthesis for first time. Pt ambulated with decreased step length and stance time on BLE.   Ambulation Distance (Feet) 12 Feet  x1   Assistive device Rolling walker  BKA prosthesis LLE; Diabetic shoes; Toe filler in AFO   Gait Pattern Decreased stride length;Decreased stance time - left;Trunk flexed;Decreased step length - right;Decreased weight shift to left;Left hip hike;Abducted - left   Gait velocity decreased   Static Standing Balance   Static Standing - Balance Support Bilateral upper extremity supported  stands with walker 30 seconds with contact assist   Static Standing - Level of Assistance 5: Stand by assistance   Static Standing - Comment/# of Minutes 1   Dynamic Standing Balance   Dynamic Standing - Balance Support Left upper extremity supported   Dynamic Standing - Level of Assistance 4: Min assist   Dynamic Standing - Balance Activities Reaching for objects  Reaches 2" anterior to walker for <5 sec release          [facility] Adult PT Treatment/Exercise - 12/14/13 0845    Ambulation/Gait   Ambulation/Gait Assistance 4: Min assist  BKA prosthesis LLE; Diabetic shoes; Toe filler in AFO   Prosthetics   Education Provided Skin check;Residual limb care;Care of non-amputated limb;Prosthetic cleaning;Ply sock  cleaning;Correct ply sock adjustment;Proper wear schedule/adjustment;Other (comment)  proper donning   Person(s) Educated Patient;Other (comment)  brother   Education Method Explanation;Demonstration;Tactile cues;Verbal cues   Education Method Verbalized understanding;Returned demonstration;Needs further instruction   Donning Prosthesis Minimal assist          PT Education - 12/14/13 1340    Education provided Yes   Education Details Prosthetic education/training   Person(s) Educated Patient;Other (comment)  Brother   Methods Explanation;Demonstration;Tactile cues;Verbal cues   Comprehension Verbalized understanding;Need further instruction;Returned demonstration          PT Short Term Goals - 12/14/13 1649    PT SHORT TERM GOAL #1   Title Ambulate 50 feet with RW, R AFO, L BKA prosthesis with supervision. (Target Date: 01/13/14)   Time 1   Period Months   Status New   PT SHORT TERM GOAL #2   Title Pt will negotiate up/down ramp and curb with minimal assistance, RW, R AFO, and L BKA prosthesis  (Target Date: 01/13/14)   Time 1   Period Months   Status New   PT SHORT TERM GOAL #3   Title Patient demonstrates & verbalizes proper prosthetic donning of bilateral prostheses modified independent.  (Target Date: 01/13/14)   Time 1   Period Months   Status New   PT SHORT TERM GOAL #4   Title Patient will tolerate prosthetic wear time of >8 hours/day  (Target Date: 01/13/14)   Time 1   Period Months   Status New   PT SHORT TERM GOAL #5   Title Patient stands /sits, maintains standing balance with walker & reaches >5" with walker without balance loss with supervision.  (Target Date: 01/13/14)   Time 1   Period Months   Status New          PT Long Term Goals - 12/14/13 1653    PT LONG TERM GOAL #1   Title Tolerates wear of prostheses >90% of awake hours without change in skin integrity nor undue tenderness. (Target Date: 02/11/14)   Time 2   Period Months   Status New    PT LONG TERM GOAL #2   Title Patient will ambulate 250f with LRAD, R AFO, L BKA prosthesis modified  independent (Target Date: 02/11/14)   Time 2   Period Months   Status New   PT LONG TERM GOAL #3   Title Patient verbalizes & demonstrates proper prosthetic care. (Target Date: 02/11/14)   Time 2   Period Months   Status New   PT LONG TERM GOAL #4   Title Patient will negotiate up/down ramp and curb with LRAD, R AFO, L BKA prosthesis modified independent (Target Date: 02/11/14)   Time 2   Period Months   Status New   PT LONG TERM GOAL #5   Title Berg Balance >36/56 (Target Date: 02/11/14)   Time 2   Period Months   Status New          Plan - 2014/01/03 1642    Clinical Impression Statement Patient has significant limitations in mobility with transfers & ambulation due to new Transtibial prosthesis on left & new partial foot /AFO prosthesis on right. Patient has decreased endurance and hip /knee ROM due to increased sitting since multiple surgeries over a year period along with dialysis. Patient requires skilled PT directed towards mobility training with prostheses to increase independence and safety to return to prior level of function.   Pt will benefit from skilled therapeutic intervention in order to improve on the following deficits Abnormal gait;Impaired flexibility;Decreased mobility;Decreased activity tolerance;Decreased endurance;Decreased range of motion;Decreased strength;Decreased balance;Difficulty walking;Decreased knowledge of use of DME;Other (comment)  prosthetic dependency   Rehab Potential Good   PT Frequency 2x / week   PT Duration Other (comment)  9 weeks (2 months)   PT Treatment/Interventions ADLs/Self Care Home Management;Gait training;Therapeutic exercise;Patient/family education;Energy conservation;Balance training;Stair training;Functional mobility training;Neuromuscular re-education;Manual techniques;Other (comment)  prosthetic training   PT Next Visit Plan  review prosthetic care; gait with walker   PT Home Exercise Plan HEP at sink   Consulted and Agree with Plan of Care Patient;Family member/caregiver   Family Member Consulted brother          G-Codes - Jan 03, 2014 1657    Functional Assessment Tool Used Patient is totally dependent in prosthetic care. Patient has not worn prosthesis outside of PT today.   Functional Limitation Self care   Self Care Current Status [phone]) 100 percent impaired, limited or restricted   Self Care Goal Status ([id number]) At least 1 percent but less than 20 percent impaired, limited or restricted           Prosthetics Assessment - January 03, 2014 0845    Prosthetic Care Dependent with Skin check;Residual limb care;Prosthetic cleaning;Ply sock cleaning;Correct ply sock adjustment;Proper wear schedule/adjustment;Other (comment)  proper donning technique   Prosthetic Care Comments  PT advised patient not to stand with AFO or prosthesis until cleared by wound center   Donning prosthesis  Min assist;Other (comment)  PT manually (demo) & verbally cueing technique   Current prosthetic wear tolerance (days/week)  0 prior to evaluation; PT advised daily wear sitting only   Current prosthetic wear tolerance (#hours/day)  0 prior to evaluation. PT instructed 2 hours 2x/day sitting only   Current prosthetic weight-bearing tolerance (hours/day)  3 minutes during evaluation including gait   Edema none noted   Residual limb condition  dry scaly skin, no open areas,            Problem List Patient Active Problem List   Diagnosis Date Noted  . Phantom limb 01/03/14  . Dehiscence of amputation stump 07/23/2013  . Type 1 diabetes mellitus with diabetic foot infection   . Diabetes mellitus with peripheral vascular disease   .  ESRD on hemodialysis   . BKA stump complication [id number]  . Asthma 05/17/2013  . S/P BKA (below knee amputation) unilateral 05/17/2013  . FTT (failure to thrive) in adult 05/01/2013  . History of  MRSA infection 04/22/2013  . Peripheral vascular disease, unspecified 11/19/2012  . End-stage renal disease on hemodialysis 05/05/2012  . End stage kidney disease 05/05/2012  . Kahler disease 03/30/2012  . Anemia in chronic kidney disease 03/19/2012  . Iron deficiency 03/19/2012  . Hypothyroidism 03/19/2012  . Anemia, iron deficiency 03/19/2012  . Hypertension 03/18/2012  . Chronic kidney disease (CKD), stage V 03/18/2012  . Benign hypertension 03/18/2012  . Cardiac conduction disorder 03/18/2012  . Insulin dependent type 1 diabetes mellitus 03/18/2012  . MGUS (monoclonal gammopathy of unknown significance) 02/28/2011  . Monoclonal paraproteinemia 02/28/2011    Merle Whitehorn 12/14/2013, 5:03 PM    This entire session was performed under direct supervision and direction of a licensed therapist/therapist assistant . I have personally read, edited and approve of the note as written.  Blima Rich, Student PT  Jamey Reas, PT, DPT PT Specializing in [place] 12/14/2013 5:05 PM Phone:  [phone]  Fax:  [phone] [place] [address] [place], [place] [address]

## 2013-12-14 NOTE — Progress Notes (Signed)
Subjective:    Patient ID: Jack [name], male    DOB: [birth year], 68 y.o.   MRN: [id number]  HPI   [name] is here in follow up of his left bka and associated pain. He just was fitted with his prosthetic leg. He began therapy today at the outpt center. His progress was delayed because of wound issues/non-healing. Dr. Sharol Given has been managing his pain the last few months. He has been taking percocet for pain control. He is complaining of stump and phantom limb symptoms on the left side. He also is having pain in the toes of the right foot and left hand.  Apparently he is having numbness and weakness in the left hand.   He is using gabapentin 123m qhs and percocet 1-2 x per day.   He continues to see the [facility] wound care clinic for treatment of his right foot.    Pain Inventory Average Pain 8 Pain Right Now 8 My pain is sharp and burning  In the last 24 hours, has pain interfered with the following? General activity 4 Relation with others 4 Enjoyment of life 6 What TIME of day is your pain at its worst? night Sleep (in general) Poor  Pain is worse with: sitting Pain improves with: medication Relief from Meds: 9  Mobility how many minutes can you walk? 0 ability to climb steps?  no do you drive?  no use a wheelchair transfers alone Do you have any goals in this area?  yes  Function not employed: date last employed . disabled: date disabled . Do you have any goals in this area?  no  Neuro/Psych bowel control problems numbness tingling  Prior Studies Any changes since last visit?  yes x-rays  Physicians involved in your care Any changes since last visit?  no   Family History  Problem Relation Age of Onset  . Diabetes Mother   . Cancer Mother     bone   . Kidney disease Father    History   Social History  . Marital Status: Divorced    Spouse Name: N/A    Number of Children: N/A  . Years of Education: N/A   Social History Main Topics  . Smoking  status: Former Smoker    Types: Cigarettes    Quit date: 03/19/1982  . Smokeless tobacco: Never Used  . Alcohol Use: No     Comment: formerly  . Drug Use: No  . Sexual Activity: No   Other Topics Concern  . None   Social History Narrative   Past Surgical History  Procedure Laterality Date  . Thyroidectomy    . Cervical disc surgery    . Insertion of dialysis catheter Right 03/19/2012    Procedure: INSERTION OF DIALYSIS CATHETER;  Surgeon: JMal Misty MD;  [place]  Service: Vascular;  Laterality: Right;  Right Internal Jugular  . Av fistula placement Left 03/25/2012    Procedure: ARTERIOVENOUS (AV) FISTULA CREATION;  Surgeon: JMal Misty MD;  Location[place]  Service: Vascular;  Laterality: Left;  . Ligation of competing branches of arteriovenous fistula Left 05/08/2012    Procedure: LIGATION OF COMPETING BRANCHES OF ARTERIOVENOUS FISTULA;  Surgeon: JMal Misty MD;  [place]  Service: Vascular;  Laterality: Left;  Ultrasound guided  . Cardiac catheterization      approx 30 years ago  . Amputation Right 11/10/2012    Procedure: AMPUTATION FIRST and SECOND TOES Right Foot;  Surgeon: CJessy Oto  Fields, MD;  Location: [place];  Service: Vascular;  Laterality: Right;  . Transmetatarsal amputation Left 12/16/2012    Procedure: TRANSMETATARSAL AMPUTATION AND VAC PLACEMENT;  Surgeon: Elam Dutch, MD;  Location: [place];  Service: Vascular;  Laterality: Left;  . Amputation Left 04/07/2013    Procedure: AMPUTATION DIGIT- LEFT 1ST TOE;  Surgeon: Mal Misty, MD;  Location: [place];  Service: Vascular;  Laterality: Left;  . Tee without cardioversion N/A 04/20/2013    Procedure: TRANSESOPHAGEAL ECHOCARDIOGRAM (TEE);  Surgeon: Josue Hector, MD;  Location: [facility]COPY;  Service: Cardiovascular;  Laterality: N/A;  . I&d extremity Left 04/22/2013    Procedure: [id number] ZHG DEBRIDEMENT LEFT FIRST TOE AMPUTATION WOUND ;  Surgeon: Mal Misty, MD;  Location: [place];   Service: Vascular;  Laterality: Left;  . Amputation Left 04/26/2013    Procedure: Left Foot Transmetatarsal Amputation;  Surgeon: Newt Minion, MD;  Location: [place];  Service: Orthopedics;  Laterality: Left;  . Toe amputation      D/C 04-30-13  . Below knee leg amputation Left 05/14/2013    DR [name]  . Amputation Left 05/14/2013    Procedure: AMPUTATION BELOW KNEE;  Surgeon: Newt Minion, MD;  Location: [place];  Service: Orthopedics;  Laterality: Left;  Left Below Knee Amputation  . Eye surgery Bilateral     CATARACTS  . Amputation Left 07/23/2013    Procedure: AMPUTATION BELOW KNEE;  Surgeon: Newt Minion, MD;  Location: [place];  Service: Orthopedics;  Laterality: Left;  Left Below Knee Amputation Revision   Past Medical History  Diagnosis Date  . Hypertension   . Pneumonia     2012  . Heart murmur   . Glaucoma   . Multiple myeloma, without mention of having achieved remission 03/30/2012    Cytogenetic neg on 03/23/2012.  . Asthma   . Hyperparathyroidism, secondary renal   . Peripheral arterial disease   . End-stage renal disease on hemodialysis     Started HD March 2014.  Cause of ESRD was DM.  Gets HD at [facility] on [address] on MWF schedule.   . Thyroid disease     hyperparathyroidism  . MRSA bacteremia   . Anemia   . Peripheral vascular disease, unspecified 11/19/2012    In the past had R foot toe amps then R TMA. In 2015 had left foot toe amp > then TMA >then L BKA on 05/14/13   . History of MRSA infection 04/22/2013    Bacteremia assoc w L foot wound infection Mar 2015   . Gangrene of foot   . ESRD on hemodialysis   . Diabetes mellitus without complication     Type 2  . Diabetes mellitus with peripheral vascular disease    BP 108/51 mmHg  Pulse 83  Resp 14  SpO2 96%  Opioid Risk Score:   Fall Risk Score:    Review of Systems  Gastrointestinal: Positive for constipation.       Bowel control problems  Endocrine:       High blood sugar  Neurological: Positive for  numbness.       Tingling  All other systems reviewed and are negative.      Objective:   Physical Exam  HENT: oral mucosa pink/moist  Head: Normocephalic and atraumatic.  Eyes: Conjunctivae are normal.  Neck: Normal range of motion. Neck supple.  Cardiovascular: Normal rate and regular rhythm. No murmurs  Respiratory: Effort normal and breath sounds normal. No wheezes  rales or rhonchi  GI: Soft. Bowel sounds are normal.  Musculoskeletal: He exhibits tenderness (left BKA site. )--tolerated dressing change.  Well healed right transmet site. Left hand with thenar atrophy and mild contracture more than right--no temperature changes or areas of breakdown. .  Neurological: He is oriented to person, place, and time.  Appropriate affect. Improving insight and awareness. Stocking-glove peripheral neuropathy. RLE weakness noted.  Skin: Skin is warm and dry. Wound clean and intact with only slight drainage. Area appropriately tender Psychiatric: His affect is blunt. He is withdrawn.    Assessment/Plan: 1. Functional deficits secondary to L-BKA  -prosthetic training at outpt rehab  -needs to keep an eye on his skin.  2. DVT Prophylaxis/Anticoagulation: Pharmaceutical: Lovenox  3. Pain Management: refilled percocet 5/325 for breakthrough pain  -increase gabapentin for phantom pain/stump sensitivity  4. Mood: continue egosupport. Reasonable spirits at present  5. Neuropsych: This patient is capable of making decisions on his own behalf.  6. ESRD: ?steal syndrome  -Continue HD on Mon, Wed, Fri  -needs to follow up with nephro and surgery regarding new fistula.  7. Peripheral neuropathy--may be worsened by AVF LUE---needs to continue to focus on good diet and health hygiene  -new AVF? 8. Follow up with me or NP in about a month. A CSA was signed today. Thirty minutes of face to face patient care time were spent during this visit. All questions were encouraged and  answered.

## 2013-12-14 NOTE — Therapy (Signed)
Physical Therapy Evaluation  Patient Details  Name: Jack [name] MRN: [id number] Date of Birth: 06-Feb-1945  Encounter Date: 12/14/2013      PT End of Session - 12/14/13 1340    Visit Number 1   Number of Visits 17   Date for PT Re-Evaluation 02/12/14   PT Start Time 0846   PT Stop Time 0935   PT Time Calculation (min) 49 min   Equipment Utilized During Treatment Gait belt   Activity Tolerance Patient tolerated treatment well;Patient limited by fatigue   Behavior During Therapy [facility] for tasks assessed/performed      Past Medical History  Diagnosis Date  . Hypertension   . Pneumonia     2012  . Heart murmur   . Glaucoma   . Multiple myeloma, without mention of having achieved remission 03/30/2012    Cytogenetic neg on 03/23/2012.  . Asthma   . Hyperparathyroidism, secondary renal   . Peripheral arterial disease   . End-stage renal disease on hemodialysis     Started HD March 2014.  Cause of ESRD was DM.  Gets HD at [facility] on [address] on MWF schedule.   . Thyroid disease     hyperparathyroidism  . MRSA bacteremia   . Anemia   . Peripheral vascular disease, unspecified 11/19/2012    In the past had R foot toe amps then R TMA. In 2015 had left foot toe amp > then TMA >then L BKA on 05/14/13   . History of MRSA infection 04/22/2013    Bacteremia assoc w L foot wound infection Mar 2015   . Gangrene of foot   . ESRD on hemodialysis   . Diabetes mellitus without complication     Type 2  . Diabetes mellitus with peripheral vascular disease     Past Surgical History  Procedure Laterality Date  . Thyroidectomy    . Cervical disc surgery    . Insertion of dialysis catheter Right 03/19/2012    Procedure: INSERTION OF DIALYSIS CATHETER;  Surgeon: Mal Misty, MD;  Location: [place];  Service: Vascular;  Laterality: Right;  Right Internal Jugular  . Av fistula placement Left 03/25/2012    Procedure: ARTERIOVENOUS (AV) FISTULA CREATION;  Surgeon: Mal Misty, MD;   Location: [place];  Service: Vascular;  Laterality: Left;  . Ligation of competing branches of arteriovenous fistula Left 05/08/2012    Procedure: LIGATION OF COMPETING BRANCHES OF ARTERIOVENOUS FISTULA;  Surgeon: Mal Misty, MD;  Location: [place];  Service: Vascular;  Laterality: Left;  Ultrasound guided  . Cardiac catheterization      approx 30 years ago  . Amputation Right 11/10/2012    Procedure: AMPUTATION FIRST and SECOND TOES Right Foot;  Surgeon: Elam Dutch, MD;  Location: [facility] OR;  Service: Vascular;  Laterality: Right;  . Transmetatarsal amputation Left 12/16/2012    Procedure: TRANSMETATARSAL AMPUTATION AND VAC PLACEMENT;  Surgeon: Elam Dutch, MD;  Location: [place];  Service: Vascular;  Laterality: Left;  . Amputation Left 04/07/2013    Procedure: AMPUTATION DIGIT- LEFT 1ST TOE;  Surgeon: Mal Misty, MD;  Location: [place];  Service: Vascular;  Laterality: Left;  . Tee without cardioversion N/A 04/20/2013    Procedure: TRANSESOPHAGEAL ECHOCARDIOGRAM (TEE);  Surgeon: Josue Hector, MD;  Location: [facility] ENDOSCOPY;  Service: Cardiovascular;  Laterality: N/A;  . I&d extremity Left 04/22/2013    Procedure: [id number] KWI DEBRIDEMENT LEFT FIRST TOE AMPUTATION WOUND ;  Surgeon: Mal Misty, MD;  Location: [facility] OR;  Service: Vascular;  Laterality: Left;  . Amputation Left 04/26/2013    Procedure: Left Foot Transmetatarsal Amputation;  Surgeon: Newt Minion, MD;  Location: [place];  Service: Orthopedics;  Laterality: Left;  . Toe amputation      D/C 04-30-13  . Below knee leg amputation Left 05/14/2013    DR [name]  . Amputation Left 05/14/2013    Procedure: AMPUTATION BELOW KNEE;  Surgeon: Newt Minion, MD;  Location: [place];  Service: Orthopedics;  Laterality: Left;  Left Below Knee Amputation  . Eye surgery Bilateral     CATARACTS  . Amputation Left 07/23/2013    Procedure: AMPUTATION BELOW KNEE;  Surgeon: Newt Minion, MD;  Location: [place];  Service: Orthopedics;  Laterality: Left;  Left  Below Knee Amputation Revision    BP 133/110 mmHg  Pulse 94  Visit Diagnosis:  Status post below knee amputation of left lower extremity  S/P transmetatarsal amputation of foot, right  Abnormality of gait  Lack of coordination      Subjective Assessment - 12/14/13 0855    Symptoms Patient has no complaints of pain or any other issues.    How long can you stand comfortably? Requires BUE support on walker to maintain standing.    How long can you walk comfortably? Has not walked yet with new prosthesis for BKA on LLE   Patient Stated Goals Would like to be able to ambulate in the community and be active with his grandson.   Currently in Pain? Other (Comment)  No pain right now, has intermittent phantom pain in amputated LLE   Pain Relieving Factors Pain Medication          [facility] PT Assessment - 12/14/13 0845    Assessment   Medical Diagnosis L transtibial amputation, R transmetatarsal amputation (07/2013)   Onset Date 07/23/13   Balance Screen   Has the patient fallen in the past 6 months No   Has the patient had a decrease in activity level because of a fear of falling?  Yes   Is the patient reluctant to leave their home because of a fear of falling?  No  Because he stays in wheelchair out in the [place] Private residence   [place];Other relatives  Brother; daughter (grown) and grandson   Available Help at Discharge Available PRN/intermittently   Type of [place] Access Level entry  must navigate curb from parking lot to apartment   [place] - single point;Walker - 2 wheels;Crutches;Bedside commode;Shower seat;Wheelchair - manual   Prior Function   Level of Independence Independent with basic ADLs;Independent with gait;Independent with transfers   Vocation Retired   Observation/Other Assessments   Focus on Therapeutic Outcomes (FOTO)  FS Primary Measure (16)    Posture/Postural Control   Posture/Postural Control Postural limitations   Postural Limitations Rounded Shoulders;Forward head   AROM   Right Knee Extension -18   Left Knee Extension -26   Right Ankle Dorsiflexion -4   PROM   Left Knee Extension -24   Strength   Overall Strength Deficits  Tested in sitting, hip flexion 4/5   Transfers   Transfers Sit to Stand;Stand to Sit   Sit to Stand 4: Min assist   Sit to Stand Details (indicate cue type and reason) to rise and stabilize   Stand to Sit 4: Min guard   Stand  to Sit Details use of hands to control descent   Ambulation/Gait   Ambulation/Gait Yes   Ambulation/Gait Assistance Details Patient ambulated with RW and min assist on prosthesis for first time. Pt ambulated with decreased step length and stance time on BLE.   Ambulation Distance (Feet) 12 Feet  x1   Assistive device Rolling walker  BKA prosthesis LLE; Diabetic shoes; Toe filler in AFO   Gait Pattern Decreased stride length;Decreased stance time - right;Decreased stance time - left;Trunk flexed   Gait velocity decreased          [facility] Adult PT Treatment/Exercise - 12/14/13 0845    Ambulation/Gait   Ambulation/Gait Assistance 4: Min assist  BKA prosthesis LLE; Diabetic shoes; Toe filler in AFO          PT Education - 12/14/13 1340    Education provided Yes   Education Details Prosthetic education/training   Person(s) Educated Patient;Other (comment)  Brother   Methods Explanation;Demonstration   Comprehension Verbalized understanding;Need further instruction          PT Short Term Goals - 12/14/13 1347    PT SHORT TERM GOAL #1   Title Ambulate 50 feet with RW, R AFO, L BKA prosthesis with min guard assist from PT (01/11/14)   Time 4   Period Weeks   Status New   PT SHORT TERM GOAL #2   Title Pt will negotiate up/down ramp and curb with minimal assistance, RW, R AFO, and L BKA prosthesis (01/11/14)   Time 4   Period Weeks   Status New   PT SHORT  TERM GOAL #3   Title Patient demonstrates & verbalizes proper prosthetic donning except cues on sock ply adjustment (01/11/14)   Time 4   Period Weeks   Status New   PT SHORT TERM GOAL #4   Title Patient will tolerate prosthetic wear time of >8 hours/day (01/11/14)   Time 4   Period Weeks   Status New   PT SHORT TERM GOAL #5   Title Patient will perform sit<>stand transfer with supervision for safety and no cues from PT   Time 4   Period Weeks   Status New          PT Long Term Goals - 12/14/13 1352    PT LONG TERM GOAL #1   Title Tolerates wear of prosthesis >90% of awake hours without change in skin integrity nor undue tenderness. (02/08/14)   Time 8   Period Weeks   Status New   PT LONG TERM GOAL #2   Title Patient will ambulate 133f with RW, R AFO, L BKA prosthesis modified independent (02/08/14)   Time 8   Period Weeks   Status New   PT LONG TERM GOAL #3   Title Patient will ambulate 518fon uneven surface with RW, R AFO, L BKA prosthesis modified independent (02/08/14)   Time 8   Period Weeks   Status New   PT LONG TERM GOAL #4   Title Patient will negotiate up/down ramp and curb with RW, R AFO, L BKA prosthesis modified independent (02/08/14)   Time 8   Period Weeks   Status New   PT LONG TERM GOAL #5   Title Patient will verbalize fall prevention strategies (02/08/14)   Time 8   Period Weeks   Status New          Plan - 12/14/13 1341    Clinical Impression Statement Patient has significant limitations in mobility with transfers and  ambulation due to decreased experience with new prosthesis. Patient has decreased endurance and hip/knee ROM due to increased sitting since multiple amputations. Patient requires skilled PT directed towards mobility training to increase independence and safety with mobility so patient can return to prior level of function.   Pt will benefit from skilled therapeutic intervention in order to improve on the following deficits Abnormal  gait;Impaired flexibility;Decreased mobility;Decreased activity tolerance;Decreased endurance;Decreased range of motion;Decreased strength;Decreased balance;Difficulty walking   Rehab Potential Good   PT Frequency 2x / week   PT Duration 8 weeks   PT Treatment/Interventions ADLs/Self Care Home Management;Gait training;Therapeutic exercise;Patient/family education;Energy conservation;Balance training;Stair training;Functional mobility training;Neuromuscular re-education;Manual techniques;Other (comment)  prosthetic training   PT Next Visit Plan Assess gait speed and Berg balance score. Further assess ambulation endurance with rolling walker, prosthesis, and AFO. Initiate "sink exercises" HEP    Consulted and Agree with Plan of Care Patient;Family member/caregiver   Family Member Consulted brother        Problem List Patient Active Problem List   Diagnosis Date Noted  . Phantom limb 12/14/2013  . Dehiscence of amputation stump 07/23/2013  . Type 1 diabetes mellitus with diabetic foot infection   . Diabetes mellitus with peripheral vascular disease   . ESRD on hemodialysis   . BKA stump complication 50/41/3643  . Asthma 05/17/2013  . S/P BKA (below knee amputation) unilateral 05/17/2013  . FTT (failure to thrive) in adult 05/01/2013  . History of MRSA infection 04/22/2013  . Peripheral vascular disease, unspecified 11/19/2012  . End-stage renal disease on hemodialysis 05/05/2012  . End stage kidney disease 05/05/2012  . Kahler disease 03/30/2012  . Anemia in chronic kidney disease 03/19/2012  . Iron deficiency 03/19/2012  . Hypothyroidism 03/19/2012  . Anemia, iron deficiency 03/19/2012  . Hypertension 03/18/2012  . Chronic kidney disease (CKD), stage V 03/18/2012  . Benign hypertension 03/18/2012  . Cardiac conduction disorder 03/18/2012  . Insulin dependent type 1 diabetes mellitus 03/18/2012  . MGUS (monoclonal gammopathy of unknown significance) 02/28/2011  . Monoclonal  paraproteinemia 02/28/2011     SEE UPDATED NOTE Jorene Guest BY Jamey Reas, PT, DPT.  Jamey Reas, PT, DPT PT Specializing in Prosthetics & Orthotics 12/14/2013 5:02 PM Phone:  [phone]  Fax:  [phone] [facility] [place] [address] [place], [place] [address] All PT delivered under supervision of Licensed Physical Therapist.    Blima Rich, Student PT 12/14/2013, 1:58 PM

## 2013-12-14 NOTE — Patient Instructions (Signed)
YOU NEED TO KEEP AN EYE ON YOUR LEG ESPECIALLY THE AREAS WE IDENTIFIED TODAY  WORK ON MASSAGE AND DESENSITIZATION OF YOUR  LEG.

## 2013-12-16 ENCOUNTER — Encounter: Payer: Self-pay | Admitting: Physical Therapy

## 2013-12-16 ENCOUNTER — Ambulatory Visit: Payer: Medicare Other | Admitting: Physical Therapy

## 2013-12-16 DIAGNOSIS — Z89431 Acquired absence of right foot: Secondary | ICD-10-CM

## 2013-12-16 DIAGNOSIS — Z89512 Acquired absence of left leg below knee: Secondary | ICD-10-CM | POA: Diagnosis not present

## 2013-12-16 DIAGNOSIS — M25669 Stiffness of unspecified knee, not elsewhere classified: Secondary | ICD-10-CM

## 2013-12-16 DIAGNOSIS — R6889 Other general symptoms and signs: Secondary | ICD-10-CM

## 2013-12-16 DIAGNOSIS — R269 Unspecified abnormalities of gait and mobility: Secondary | ICD-10-CM

## 2013-12-16 NOTE — Therapy (Signed)
[facility] [address] [place], [place], [address] Phone: [phone]   Fax:  [phone]  Physical Therapy Treatment  Patient Details  Name: Jack [name] MRN: [id number] Date of Birth: 08/22/1945  Encounter Date: 12/16/2013      PT End of Session - 12/16/13 1116    Visit Number 2   Number of Visits 18   Date for PT Re-Evaluation 02/11/14   PT Start Time 0811   PT Stop Time 0905   PT Time Calculation (min) 54 min   Equipment Utilized During Treatment Gait belt   Activity Tolerance Patient tolerated treatment well   Behavior During Therapy [facility] for tasks assessed/performed      Past Medical History  Diagnosis Date  . Hypertension   . Pneumonia     2012  . Heart murmur   . Glaucoma   . Multiple myeloma, without mention of having achieved remission 03/30/2012    Cytogenetic neg on 03/23/2012.  . Asthma   . Hyperparathyroidism, secondary renal   . Peripheral arterial disease   . End-stage renal disease on hemodialysis     Started HD March 2014.  Cause of ESRD was DM.  Gets HD at [facility] on [address] on MWF schedule.   . Thyroid disease     hyperparathyroidism  . MRSA bacteremia   . Anemia   . Peripheral vascular disease, unspecified 11/19/2012    In the past had R foot toe amps then R TMA. In 2015 had left foot toe amp > then TMA >then L BKA on 05/14/13   . History of MRSA infection 04/22/2013    Bacteremia assoc w L foot wound infection Mar 2015   . Gangrene of foot   . ESRD on hemodialysis   . Diabetes mellitus without complication     Type 2  . Diabetes mellitus with peripheral vascular disease     Past Surgical History  Procedure Laterality Date  . Thyroidectomy    . Cervical disc surgery    . Insertion of dialysis catheter Right 03/19/2012    Procedure: INSERTION OF DIALYSIS CATHETER;  Surgeon: Mal Misty, MD;  Location: [place];  Service: Vascular;  Laterality: Right;  Right Internal Jugular  . Av  fistula placement Left 03/25/2012    Procedure: ARTERIOVENOUS (AV) FISTULA CREATION;  Surgeon: Mal Misty, MD;  Location: [place];  Service: Vascular;  Laterality: Left;  . Ligation of competing branches of arteriovenous fistula Left 05/08/2012    Procedure: LIGATION OF COMPETING BRANCHES OF ARTERIOVENOUS FISTULA;  Surgeon: Mal Misty, MD;  Location: [place];  Service: Vascular;  Laterality: Left;  Ultrasound guided  . Cardiac catheterization      approx 30 years ago  . Amputation Right 11/10/2012    Procedure: AMPUTATION FIRST and SECOND TOES Right Foot;  Surgeon: Elam Dutch, MD;  Location: [facility] OR;  Service: Vascular;  Laterality: Right;  . Transmetatarsal amputation Left 12/16/2012    Procedure: TRANSMETATARSAL AMPUTATION AND VAC PLACEMENT;  Surgeon: Elam Dutch, MD;  Location: [place];  Service: Vascular;  Laterality: Left;  . Amputation Left 04/07/2013    Procedure: AMPUTATION DIGIT- LEFT 1ST TOE;  Surgeon: Mal Misty, MD;  Location: [place];  Service: Vascular;  Laterality: Left;  . Tee without cardioversion N/A 04/20/2013    Procedure: TRANSESOPHAGEAL ECHOCARDIOGRAM (TEE);  Surgeon: Josue Hector, MD;  Location: [place];  Service: Cardiovascular;  Laterality: N/A;  . I&d extremity Left 04/22/2013  Procedure: IRRIGATION AND DEBRIDEMENT LEFT FIRST TOE AMPUTATION WOUND ;  Surgeon: Mal Misty, MD;  Location: [place];  Service: Vascular;  Laterality: Left;  . Amputation Left 04/26/2013    Procedure: Left Foot Transmetatarsal Amputation;  Surgeon: Newt Minion, MD;  Location: [place];  Service: Orthopedics;  Laterality: Left;  . Toe amputation      D/C 04-30-13  . Below knee leg amputation Left 05/14/2013    DR [name]  . Amputation Left 05/14/2013    Procedure: AMPUTATION BELOW KNEE;  Surgeon: Newt Minion, MD;  Location: [place];  Service: Orthopedics;  Laterality: Left;  Left Below Knee Amputation  . Eye surgery Bilateral     CATARACTS  . Amputation Left 07/23/2013    Procedure:  AMPUTATION BELOW KNEE;  Surgeon: Newt Minion, MD;  Location: [place];  Service: Orthopedics;  Laterality: Left;  Left Below Knee Amputation Revision    There were no vitals taken for this visit.  Visit Diagnosis:  Status post below knee amputation of left lower extremity  S/P transmetatarsal amputation of foot, right  Abnormality of gait  Activity intolerance  Decreased ROM of lower extremity      Subjective Assessment - 12/16/13 0817    Symptoms States after PT eval he went to wound care Doctor who put in orders for non weight bearing on RLE until MRI scheduled for next thrusday (12/23/13) to check status of wound. Patient states his sock became bloody after ambulating in PT evaluation.    Currently in Pain? Yes   Pain Score 8    Pain Location Foot   Pain Orientation Right   Pain Descriptors / Indicators Aching   Pain Onset In the past 7 days   Pain Frequency Constant   Aggravating Factors  weight bearing   Pain Relieving Factors pain medication          [facility] PT Assessment - 12/16/13 0800    AROM   Right Hip Extension --  R hip extension (lacking 13 degrees)   Left Hip Extension --  L hip extension (lacking 8 degrees)   Strength   Overall Strength --  Hip abduction (R) 5/5; (L) 5/5          [facility] Adult PT Treatment/Exercise - 12/16/13 0800    Bed Mobility   Bed Mobility Rolling Right;Rolling Left;Right Sidelying to Sit;Left Sidelying to Sit;Sit to Supine  Pt taught log rolling technique; pt return demonstrated   Rolling Right 6: Modified independent (Device/Increase time)   Rolling Left 6: Modified independent (Device/Increase time)   Right Sidelying to Sit 6: Modified independent (Device/Increase time)   Left Sidelying to Sit 6: Modified independent (Device/Increase time)   Supine to Sit 6: Modified independent (Device/Increase time)   Sit to Supine 6: Modified independent (Device/Increase time)   Transfers   Transfers Set designer Transfers;Lateral/Scoot  Transfers   Sit to Stand --   Sit to Stand Details (indicate cue type and reason) --   Stand to Sit 4: Min assist;With upper extremity assist   Stand to Sit Details patient control descend using hands   Squat Pivot Transfers 3: Mod assist  NWB on RLE   Squat Pivot Transfer Details (indicate cue type and reason) patient had more diffculty standing due to NWB status on RLE requiring pt to put all weight through LLE and new BKA prosthesis.   Lateral/Scoot Transfers 5: Supervision;With slide board;With armrests removed   Lateral/Scoot Transfer Details (indicate cue type and reason) transfer bed  to chair using sliding board transfer. pt demonstrated safety and proper sequencing with supervision to ensure safety   Ambulation/Gait   Ambulation/Gait No   Exercises   Exercises Knee/Hip   Knee/Hip Exercises: Stretches   Active Hamstring Stretch 3 reps;30 seconds  long sit w/ one leg on floor for balance   Knee/Hip Exercises: Seated   Stool Scoot - Round Trips quad set 3 seconds x10 repetitions with towel under distal limb   Prosthetics   Prosthetic Care Comments  PT reinforced non weight bearing status ordered by wound care doctor   Current prosthetic wear tolerance (days/week)  2  has only had prosthesis 2 days   Current prosthetic wear tolerance (#hours/day)  1x 2hours  instructed to increase to 2x 2hours/day   Residual limb condition  dry scaly skin, no open areas,   insturcted pt to apply lotion at night   Education Provided Skin check;Residual limb care;Care of non-amputated limb;Correct ply sock adjustment;Proper wear schedule/adjustment;Proper weight-bearing schedule/adjustment   Person(s) Educated Patient;Other (comment)  brother   Education Method Explanation;Demonstration   Education Method Verbalized understanding   Donning Prosthesis Minimal assist          PT Education - 12/16/13 1115    Education provided Yes   Education Details Transfer review; HEP introduction    Person(s) Educated Patient   Methods Explanation;Demonstration   Comprehension Verbalized understanding;Returned demonstration;Need further instruction          PT Short Term Goals - 12/16/13 0800    PT SHORT TERM GOAL #1   Title Ambulate 50 feet with RW, R AFO, L BKA prosthesis with supervision. (Target Date: 01/13/14)   Time 1   Period Months   Status On-going   PT SHORT TERM GOAL #2   Title Pt will negotiate up/down ramp and curb with minimal assistance, RW, R AFO, and L BKA prosthesis  (Target Date: 01/13/14)   Time 1   Period Months   Status On-going   PT SHORT TERM GOAL #3   Title Patient demonstrates & verbalizes proper prosthetic donning of bilateral prostheses modified independent.  (Target Date: 01/13/14)   Time 1   Period Months   Status On-going   PT SHORT TERM GOAL #4   Title Patient will tolerate prosthetic wear time of >8 hours/day  (Target Date: 01/13/14)   Time 1   Period Months   Status On-going   PT SHORT TERM GOAL #5   Title Patient stands /sits, maintains standing balance with walker & reaches >5" with walker without balance loss with supervision.  (Target Date: 01/13/14)   Time 1   Period Months   Status On-going          PT Long Term Goals - 12/16/13 0800    PT LONG TERM GOAL #1   Title Tolerates wear of prostheses >90% of awake hours without change in skin integrity nor undue tenderness. (Target Date: 02/11/14)   Time 2   Period Months   Status On-going   PT LONG TERM GOAL #2   Title Patient will ambulate 264f with LRAD, R AFO, L BKA prosthesis modified independent (Target Date: 02/11/14)   Time 2   Period Months   Status On-going   PT LONG TERM GOAL #3   Title Patient verbalizes & demonstrates proper prosthetic care. (Target Date: 02/11/14)   Time 2   Period Months   Status On-going   PT LONG TERM GOAL #4   Title Patient will negotiate up/down ramp and curb with  LRAD, R AFO, L BKA prosthesis modified independent (Target Date: 02/11/14)    Time 2   Period Months   Status On-going   PT LONG TERM GOAL #5   Title Berg Balance >36/56 (Target Date: 02/11/14)   Time 2   Period Months   Status On-going          Plan - 12/16/13 1117    Clinical Impression Statement Patient treatment limited by non weight bearing status ordered by wound care doctor. Patient was instructed and return demonstrated sliding board transfer safely and verbalized understanding that he should not put weight through RLE when performing any mobility. Patient correctly demonstrated both home exercises provided and would benefit from more home exercises to address Hamstring tightness, decreased L knee extension and decreased bilateral hip extension.   Pt will benefit from skilled therapeutic intervention in order to improve on the following deficits Abnormal gait;Impaired flexibility;Decreased mobility;Decreased activity tolerance;Decreased endurance;Decreased range of motion;Decreased strength;Decreased balance;Difficulty walking;Decreased knowledge of use of DME   Rehab Potential Good   PT Frequency 2x / week   PT Duration 8 weeks   PT Treatment/Interventions ADLs/Self Care Home Management;Gait training;Therapeutic exercise;Patient/family education;Energy conservation;Balance training;Stair training;Functional mobility training;Neuromuscular re-education;Manual techniques;Other (comment)  prosthetic training   PT Next Visit Plan Review HEP compliance (quad set, HS stretch) (+) more to HEP; check weight bearing status; work on standing in parallel bars with right knee supported on chair/surface to ensure NWB on right foot, through leg to knee only.   PT Home Exercise Plan HS stretches in long sitting; quad sets in long sitting   Consulted and Agree with Plan of Care Patient;Family member/caregiver   Family Member Consulted Brother        Problem List Patient Active Problem List   Diagnosis Date Noted  . Phantom limb 12/14/2013  . Dehiscence of amputation  stump 07/23/2013  . Type 1 diabetes mellitus with diabetic foot infection   . Diabetes mellitus with peripheral vascular disease   . ESRD on hemodialysis   . BKA stump complication 70/76/1518  . Asthma 05/17/2013  . S/P BKA (below knee amputation) unilateral 05/17/2013  . FTT (failure to thrive) in adult 05/01/2013  . History of MRSA infection 04/22/2013  . Peripheral vascular disease, unspecified 11/19/2012  . End-stage renal disease on hemodialysis 05/05/2012  . End stage kidney disease 05/05/2012  . Kahler disease 03/30/2012  . Anemia in chronic kidney disease 03/19/2012  . Iron deficiency 03/19/2012  . Hypothyroidism 03/19/2012  . Anemia, iron deficiency 03/19/2012  . Hypertension 03/18/2012  . Chronic kidney disease (CKD), stage V 03/18/2012  . Benign hypertension 03/18/2012  . Cardiac conduction disorder 03/18/2012  . Insulin dependent type 1 diabetes mellitus 03/18/2012  . MGUS (monoclonal gammopathy of unknown significance) 02/28/2011  . Monoclonal paraproteinemia 02/28/2011    All PT delivered under supervision of Licensed Physical Therapist Assistant   Blima Rich, Student PT 12/16/2013, 11:23 AM   Willow Ora, PTA, [employer] [address], [place], [place] [address] [phone] 12/16/2013, 12:40 PM

## 2013-12-16 NOTE — Patient Instructions (Signed)
Hamstring Stretch, Seated (Strap, Two Chairs)   PERFORM ON BED OR SOFA WITH ONE LEG STRAIGHT AND OTHER LEG WITH FOOT ON FLOOR (DON'T PAY ATTENTION TO CHAIRS). Sit with one leg extended onto facing chair. Loop strap over outstretched foot at ball of big toe. Lengthen spine. Hold for ____ breaths. Repeat ____ times each leg.  Copyright  VHI. All rights reserved.  KNEE: Extension (Isometric)  CAN PERFORM LAYING DOWN OR SITTING ON SOFA/BED WITH SLIDING BOARD UNDER LEG AND TOWEL UNDER END OF LEG Lie on back, legs straight. Tighten quadriceps by pushing back of knee into surface. Hold __3_ seconds. _10__ reps per set, __2-3_ sets per day, _7__ days per week   Copyright  VHI. All rights reserved.

## 2013-12-21 ENCOUNTER — Ambulatory Visit: Payer: Medicare Other | Admitting: Physical Therapy

## 2013-12-21 ENCOUNTER — Encounter: Payer: Self-pay | Admitting: Physical Therapy

## 2013-12-21 DIAGNOSIS — Z89512 Acquired absence of left leg below knee: Secondary | ICD-10-CM | POA: Diagnosis not present

## 2013-12-21 DIAGNOSIS — I10 Essential (primary) hypertension: Secondary | ICD-10-CM | POA: Diagnosis not present

## 2013-12-21 DIAGNOSIS — E11621 Type 2 diabetes mellitus with foot ulcer: Secondary | ICD-10-CM | POA: Diagnosis not present

## 2013-12-21 DIAGNOSIS — L97419 Non-pressure chronic ulcer of right heel and midfoot with unspecified severity: Secondary | ICD-10-CM | POA: Diagnosis not present

## 2013-12-21 DIAGNOSIS — R269 Unspecified abnormalities of gait and mobility: Secondary | ICD-10-CM

## 2013-12-21 DIAGNOSIS — E1151 Type 2 diabetes mellitus with diabetic peripheral angiopathy without gangrene: Secondary | ICD-10-CM | POA: Diagnosis not present

## 2013-12-21 DIAGNOSIS — R6889 Other general symptoms and signs: Secondary | ICD-10-CM

## 2013-12-21 NOTE — Therapy (Signed)
[facility] [address] [place], [place], [address] Phone: [phone]   Fax:  [phone]  Physical Therapy Treatment  Patient Details  Name: Jack [name] MRN: [id number] Date of Birth: 18-Jan-1945  Encounter Date: 12/21/2013      PT End of Session - 12/21/13 1229    Visit Number 3   Number of Visits 18   Date for PT Re-Evaluation 02/11/14   PT Start Time 0805   PT Stop Time 0845   PT Time Calculation (min) 40 min   Equipment Utilized During Treatment Gait belt   Activity Tolerance Patient tolerated treatment well   Behavior During Therapy [facility] for tasks assessed/performed      Past Medical History  Diagnosis Date  . Hypertension   . Pneumonia     2012  . Heart murmur   . Glaucoma   . Multiple myeloma, without mention of having achieved remission 03/30/2012    Cytogenetic neg on 03/23/2012.  . Asthma   . Hyperparathyroidism, secondary renal   . Peripheral arterial disease   . End-stage renal disease on hemodialysis     Started HD March 2014.  Cause of ESRD was DM.  Gets HD at [facility] on [address] on MWF schedule.   . Thyroid disease     hyperparathyroidism  . MRSA bacteremia   . Anemia   . Peripheral vascular disease, unspecified 11/19/2012    In the past had R foot toe amps then R TMA. In 2015 had left foot toe amp > then TMA >then L BKA on 05/14/13   . History of MRSA infection 04/22/2013    Bacteremia assoc w L foot wound infection Mar 2015   . Gangrene of foot   . ESRD on hemodialysis   . Diabetes mellitus without complication     Type 2  . Diabetes mellitus with peripheral vascular disease     Past Surgical History  Procedure Laterality Date  . Thyroidectomy    . Cervical disc surgery    . Insertion of dialysis catheter Right 03/19/2012    Procedure: INSERTION OF DIALYSIS CATHETER;  Surgeon: Mal Misty, MD;  Location: [place];  Service: Vascular;  Laterality: Right;  Right Internal Jugular  . Av  fistula placement Left 03/25/2012    Procedure: ARTERIOVENOUS (AV) FISTULA CREATION;  Surgeon: Mal Misty, MD;  Location: [place];  Service: Vascular;  Laterality: Left;  . Ligation of competing branches of arteriovenous fistula Left 05/08/2012    Procedure: LIGATION OF COMPETING BRANCHES OF ARTERIOVENOUS FISTULA;  Surgeon: Mal Misty, MD;  Location: [place];  Service: Vascular;  Laterality: Left;  Ultrasound guided  . Cardiac catheterization      approx 30 years ago  . Amputation Right 11/10/2012    Procedure: AMPUTATION FIRST and SECOND TOES Right Foot;  Surgeon: Elam Dutch, MD;  Location: [facility] OR;  Service: Vascular;  Laterality: Right;  . Transmetatarsal amputation Left 12/16/2012    Procedure: TRANSMETATARSAL AMPUTATION AND VAC PLACEMENT;  Surgeon: Elam Dutch, MD;  Location: [place];  Service: Vascular;  Laterality: Left;  . Amputation Left 04/07/2013    Procedure: AMPUTATION DIGIT- LEFT 1ST TOE;  Surgeon: Mal Misty, MD;  Location: [place];  Service: Vascular;  Laterality: Left;  . Tee without cardioversion N/A 04/20/2013    Procedure: TRANSESOPHAGEAL ECHOCARDIOGRAM (TEE);  Surgeon: Josue Hector, MD;  Location: [place];  Service: Cardiovascular;  Laterality: N/A;  . I&d extremity Left 04/22/2013  Procedure: IRRIGATION AND DEBRIDEMENT LEFT FIRST TOE AMPUTATION WOUND ;  Surgeon: Mal Misty, MD;  Location: [place];  Service: Vascular;  Laterality: Left;  . Amputation Left 04/26/2013    Procedure: Left Foot Transmetatarsal Amputation;  Surgeon: Newt Minion, MD;  Location: [place];  Service: Orthopedics;  Laterality: Left;  . Toe amputation      D/C 04-30-13  . Below knee leg amputation Left 05/14/2013    DR [name]  . Amputation Left 05/14/2013    Procedure: AMPUTATION BELOW KNEE;  Surgeon: Newt Minion, MD;  Location: [place];  Service: Orthopedics;  Laterality: Left;  Left Below Knee Amputation  . Eye surgery Bilateral     CATARACTS  . Amputation Left 07/23/2013    Procedure:  AMPUTATION BELOW KNEE;  Surgeon: Newt Minion, MD;  Location: [place];  Service: Orthopedics;  Laterality: Left;  Left Below Knee Amputation Revision    There were no vitals taken for this visit.  Visit Diagnosis:  Abnormality of gait  Activity intolerance      Subjective Assessment - 12/21/13 0818    Symptoms Reports he lost his shrinker, has been missing a couple of days. Lost both of them. No falls.    Currently in Pain? Yes   Pain Score 7    Pain Location Foot   Pain Orientation Right   Pain Descriptors / Indicators Aching;Sore   Pain Type Other (Comment)   Pain Radiating Towards up right leg from foot wound. Also has phantom pain of 8/10 in left limb.   Pain Onset More than a month ago   Pain Frequency Constant   Aggravating Factors  weight bearing on left with prostheis, wound pain with healing/wound care   Pain Relieving Factors pain medicaiton            [facility] Adult PT Treatment/Exercise - 12/21/13 0820    Transfers   Sit to Stand 4: Min assist;With upper extremity assist;From chair/3-in-1  x3 reps in // bars   Sit to Stand Details (indicate cue type and reason) in parallel bars with cues/assist to maintain NWB on right foot. Modified stand with right knee in chair to keep weight off right foot.                        Stand to Sit 4: Min assist;With upper extremity assist;To chair/3-in-1  x3 reps in // bars   Stand to Sit Details cues to reach back for chair and on maintaining NWB on right foot with sitting down.   Static Standing Balance   Static Standing - Balance Support Bilateral upper extremity supported;During functional activity   Static Standing - Level of Assistance 4: Min assist   Static Standing - Comment/# of Minutes in parallel bars with right knee on chair: lateral weight shifts, forward/backward weight shifts, alternating reaching up, alternating reaching across body and forward/backward stepping with left leg and standing terminal knee extension on left  leg. Mirror used to provide visual feedback on posture/body position along with cues/facilitation from therapist.                              Prosthetics   Current prosthetic wear tolerance (days/week)  3   Current prosthetic wear tolerance (#hours/day)  2.5 total   Edema none noted   Residual limb condition  dry scaly skin, no open areas,    Education Provided Correct ply sock  adjustment;Proper wear schedule/adjustment;Proper weight-bearing schedule/adjustment   Person(s) Educated Other (comment)  brother   Education Method Explanation;Demonstration   Education Method Verbalized understanding;Needs further instruction            PT Short Term Goals - 12/21/13 1232    PT SHORT TERM GOAL #1   Title Ambulate 50 feet with RW, R AFO, L BKA prosthesis with supervision. (Target Date: 01/13/14)   Time 1   Period Months   Status On-going   PT SHORT TERM GOAL #2   Title Pt will negotiate up/down ramp and curb with minimal assistance, RW, R AFO, and L BKA prosthesis  (Target Date: 01/13/14)   Time 1   Period Months   Status On-going   PT SHORT TERM GOAL #3   Title Patient demonstrates & verbalizes proper prosthetic donning of bilateral prostheses modified independent.  (Target Date: 01/13/14)   Time 1   Period Months   Status On-going   PT SHORT TERM GOAL #4   Title Patient will tolerate prosthetic wear time of >8 hours/day  (Target Date: 01/13/14)   Time 1   Period Months   Status On-going   PT SHORT TERM GOAL #5   Title Patient stands /sits, maintains standing balance with walker & reaches >5" with walker without balance loss with supervision.  (Target Date: 01/13/14)   Time 1   Period Months   Status On-going          PT Long Term Goals - 12/21/13 1232    PT LONG TERM GOAL #1   Title Tolerates wear of prostheses >90% of awake hours without change in skin integrity nor undue tenderness. (Target Date: 02/11/14)   Time 2   Period Months   Status On-going   PT LONG TERM  GOAL #2   Title Patient will ambulate 271f with LRAD, R AFO, L BKA prosthesis modified independent (Target Date: 02/11/14)   Time 2   Period Months   Status On-going   PT LONG TERM GOAL #3   Title Patient verbalizes & demonstrates proper prosthetic care. (Target Date: 02/11/14)   Time 2   Period Months   Status On-going   PT LONG TERM GOAL #4   Title Patient will negotiate up/down ramp and curb with LRAD, R AFO, L BKA prosthesis modified independent (Target Date: 02/11/14)   Time 2   Period Months   Status On-going   PT LONG TERM GOAL #5   Title Berg Balance >36/56 (Target Date: 02/11/14)   Time 2   Period Months   Status On-going          Plan - 12/21/13 1230    Clinical Impression Statement Worked on prosthetic weight bearing in parallel bars today while maintaing NWB on right leg. Worked on weight shifts and postural activities. Pt reported only fatigue afterwards, no increase in pain reported.                        Pt will benefit from skilled therapeutic intervention in order to improve on the following deficits Abnormal gait;Impaired flexibility;Decreased mobility;Decreased activity tolerance;Decreased endurance;Decreased range of motion;Decreased strength;Decreased balance;Difficulty walking;Decreased knowledge of use of DME   Rehab Potential Good   PT Frequency 2x / week   PT Duration 8 weeks   PT Treatment/Interventions ADLs/Self Care Home Management;Gait training;Therapeutic exercise;Patient/family education;Energy conservation;Balance training;Stair training;Functional mobility training;Neuromuscular re-education;Manual techniques;Other (comment)  prosthetic training   PT Next Visit Plan Continue with exercises and standing in parallel  bars with NWB on right foot   PT Home Exercise Plan HS stretches in long sitting; quad sets in long sitting   Consulted and Agree with Plan of Care Patient;Family member/caregiver   Family Member Consulted Brother        Problem  List Patient Active Problem List   Diagnosis Date Noted  . Phantom limb 12/14/2013  . Dehiscence of amputation stump 07/23/2013  . Type 1 diabetes mellitus with diabetic foot infection   . Diabetes mellitus with peripheral vascular disease   . ESRD on hemodialysis   . BKA stump complication 09/81/1914  . Asthma 05/17/2013  . S/P BKA (below knee amputation) unilateral 05/17/2013  . FTT (failure to thrive) in adult 05/01/2013  . History of MRSA infection 04/22/2013  . Peripheral vascular disease, unspecified 11/19/2012  . End-stage renal disease on hemodialysis 05/05/2012  . End stage kidney disease 05/05/2012  . Kahler disease 03/30/2012  . Anemia in chronic kidney disease 03/19/2012  . Iron deficiency 03/19/2012  . Hypothyroidism 03/19/2012  . Anemia, iron deficiency 03/19/2012  . Hypertension 03/18/2012  . Chronic kidney disease (CKD), stage V 03/18/2012  . Benign hypertension 03/18/2012  . Cardiac conduction disorder 03/18/2012  . Insulin dependent type 1 diabetes mellitus 03/18/2012  . MGUS (monoclonal gammopathy of unknown significance) 02/28/2011  . Monoclonal paraproteinemia 02/28/2011    Willow Ora 12/21/2013, 4:38 PM  Willow Ora, PTA, [place] [address], [place], [place] [address] [phone] 12/21/2013, 4:38 PM

## 2013-12-23 ENCOUNTER — Encounter: Payer: Self-pay | Admitting: Physical Therapy

## 2013-12-23 ENCOUNTER — Ambulatory Visit: Payer: Medicare Other | Admitting: Physical Therapy

## 2013-12-23 DIAGNOSIS — Z89512 Acquired absence of left leg below knee: Secondary | ICD-10-CM | POA: Diagnosis not present

## 2013-12-23 DIAGNOSIS — R6889 Other general symptoms and signs: Secondary | ICD-10-CM

## 2013-12-23 DIAGNOSIS — R269 Unspecified abnormalities of gait and mobility: Secondary | ICD-10-CM

## 2013-12-23 DIAGNOSIS — M25669 Stiffness of unspecified knee, not elsewhere classified: Secondary | ICD-10-CM

## 2013-12-23 NOTE — Therapy (Signed)
[facility] [address] [place], [place], [address] Phone: [phone]   Fax:  [phone]  Physical Therapy Treatment  Patient Details  Name: Jack [name] MRN: [id number] Date of Birth: [birth year]  Encounter Date: 12/23/2013      PT End of Session - 12/23/13 0844    Visit Number 3   Number of Visits 18   Date for PT Re-Evaluation 02/11/14   PT Start Time 0805   PT Stop Time 0845   PT Time Calculation (min) 40 min   Equipment Utilized During Treatment Gait belt   Activity Tolerance Patient tolerated treatment well   Behavior During Therapy [facility] for tasks assessed/performed      Past Medical History  Diagnosis Date  . Hypertension   . Pneumonia     2012  . Heart murmur   . Glaucoma   . Multiple myeloma, without mention of having achieved remission 03/30/2012    Cytogenetic neg on 03/23/2012.  . Asthma   . Hyperparathyroidism, secondary renal   . Peripheral arterial disease   . End-stage renal disease on hemodialysis     Started HD March 2014.  Cause of ESRD was DM.  Gets HD at [facility] on [address] on MWF schedule.   . Thyroid disease     hyperparathyroidism  . MRSA bacteremia   . Anemia   . Peripheral vascular disease, unspecified 11/19/2012    In the past had R foot toe amps then R TMA. In 2015 had left foot toe amp > then TMA >then L BKA on 05/14/13   . History of MRSA infection 04/22/2013    Bacteremia assoc w L foot wound infection Mar 2015   . Gangrene of foot   . ESRD on hemodialysis   . Diabetes mellitus without complication     Type 2  . Diabetes mellitus with peripheral vascular disease     Past Surgical History  Procedure Laterality Date  . Thyroidectomy    . Cervical disc surgery    . Insertion of dialysis catheter Right 03/19/2012    Procedure: INSERTION OF DIALYSIS CATHETER;  Surgeon: Mal Misty, MD;  Location: [place];  Service: Vascular;  Laterality: Right;  Right Internal Jugular  . Av  fistula placement Left 03/25/2012    Procedure: ARTERIOVENOUS (AV) FISTULA CREATION;  Surgeon: Mal Misty, MD;  Location: [place];  Service: Vascular;  Laterality: Left;  . Ligation of competing branches of arteriovenous fistula Left 05/08/2012    Procedure: LIGATION OF COMPETING BRANCHES OF ARTERIOVENOUS FISTULA;  Surgeon: Mal Misty, MD;  Location: [place];  Service: Vascular;  Laterality: Left;  Ultrasound guided  . Cardiac catheterization      approx 30 years ago  . Amputation Right 11/10/2012    Procedure: AMPUTATION FIRST and SECOND TOES Right Foot;  Surgeon: Elam Dutch, MD;  Location: [facility] OR;  Service: Vascular;  Laterality: Right;  . Transmetatarsal amputation Left 12/16/2012    Procedure: TRANSMETATARSAL AMPUTATION AND VAC PLACEMENT;  Surgeon: Elam Dutch, MD;  Location: [place];  Service: Vascular;  Laterality: Left;  . Amputation Left 04/07/2013    Procedure: AMPUTATION DIGIT- LEFT 1ST TOE;  Surgeon: Mal Misty, MD;  Location: [place];  Service: Vascular;  Laterality: Left;  . Tee without cardioversion N/A 04/20/2013    Procedure: TRANSESOPHAGEAL ECHOCARDIOGRAM (TEE);  Surgeon: Josue Hector, MD;  Location: [place];  Service: Cardiovascular;  Laterality: N/A;  . I&d extremity Left 04/22/2013  Procedure: IRRIGATION AND DEBRIDEMENT LEFT FIRST TOE AMPUTATION WOUND ;  Surgeon: Mal Misty, MD;  Location: [place];  Service: Vascular;  Laterality: Left;  . Amputation Left 04/26/2013    Procedure: Left Foot Transmetatarsal Amputation;  Surgeon: Newt Minion, MD;  Location: [place];  Service: Orthopedics;  Laterality: Left;  . Toe amputation      D/C 04-30-13  . Below knee leg amputation Left 05/14/2013    DR [name]  . Amputation Left 05/14/2013    Procedure: AMPUTATION BELOW KNEE;  Surgeon: Newt Minion, MD;  Location: [place];  Service: Orthopedics;  Laterality: Left;  Left Below Knee Amputation  . Eye surgery Bilateral     CATARACTS  . Amputation Left 07/23/2013    Procedure:  AMPUTATION BELOW KNEE;  Surgeon: Newt Minion, MD;  Location: [place];  Service: Orthopedics;  Laterality: Left;  Left Below Knee Amputation Revision  . Abdominal aortagram Bilateral 11/06/2012    Procedure: ABDOMINAL AORTAGRAM;  Surgeon: Elam Dutch, MD;  Location: [facility] CATH LAB;  Service: Cardiovascular;  Laterality: Bilateral;    There were no vitals taken for this visit.  Visit Diagnosis:  Abnormality of gait  Activity intolerance  Status post below knee amputation of left lower extremity  Decreased ROM of lower extremity      Subjective Assessment - 12/23/13 0816    Symptoms Has new shrinker. Saw wound care after last visit and has now been released to bear full weight on right foot. Brought in md order for this.   Currently in Pain? No/denies            [facility] Adult PT Treatment/Exercise - 12/23/13 0817    Transfers   Sit to Stand 4: Min assist;With upper extremity assist;From chair/3-in-1   Sit to Stand Details (indicate cue type and reason) cues on technique and seqence with standing w/c to sink   Stand to Sit 4: Min assist;With upper extremity assist;To chair/3-in-1   Stand to Sit Details cues to reach back and use arms to control descent   Prosthetics   Current prosthetic wear tolerance (days/week)  7   Current prosthetic wear tolerance (#hours/day)  3 hours 2x day   Edema none noted   Education Provided Proper wear schedule/adjustment;Proper weight-bearing schedule/adjustment  proper donning of liner/prosthesis   Person(s) Educated Patient;Other (comment)  brother   Education Method Explanation;Demonstration;Verbal cues   Education Method Verbalized understanding;Needs further instruction   Donning Prosthesis Minimal assist   Doffing Prosthesis Minimal assist          PT Education - 12/23/13 0835    Education provided Yes   Education Details sink HEP for balance and proprioception   Person(s) Educated Patient;Child(ren);Other (comment)  daughter and  brother   Methods Explanation;Demonstration;Handout;Verbal cues   Comprehension Verbalized understanding;Verbal cues required;Need further instruction          PT Short Term Goals - 12/23/13 1927    PT SHORT TERM GOAL #1   Title Ambulate 50 feet with RW, R AFO, L BKA prosthesis with supervision. (Target Date: 01/13/14)   Time 1   Period Months   Status On-going   PT SHORT TERM GOAL #2   Title Pt will negotiate up/down ramp and curb with minimal assistance, RW, R AFO, and L BKA prosthesis  (Target Date: 01/13/14)   Time 1   Period Months   Status On-going   PT SHORT TERM GOAL #3   Title Patient demonstrates & verbalizes proper prosthetic donning of  bilateral prostheses modified independent.  (Target Date: 01/13/14)   Time 1   Period Months   Status On-going   PT SHORT TERM GOAL #4   Title Patient will tolerate prosthetic wear time of >8 hours/day  (Target Date: 01/13/14)   Time 1   Period Months   Status On-going   PT SHORT TERM GOAL #5   Title Patient stands /sits, maintains standing balance with walker & reaches >5" with walker without balance loss with supervision.  (Target Date: 01/13/14)   Time 1   Period Months   Status On-going          PT Long Term Goals - 12/23/13 1928    PT LONG TERM GOAL #1   Title Tolerates wear of prostheses >90% of awake hours without change in skin integrity nor undue tenderness. (Target Date: 02/11/14)   Time 2   Period Months   Status On-going   PT LONG TERM GOAL #2   Title Patient will ambulate 277f with LRAD, R AFO, L BKA prosthesis modified independent (Target Date: 02/11/14)   Time 2   Period Months   Status On-going   PT LONG TERM GOAL #3   Title Patient verbalizes & demonstrates proper prosthetic care. (Target Date: 02/11/14)   Time 2   Period Months   Status On-going   PT LONG TERM GOAL #4   Title Patient will negotiate up/down ramp and curb with LRAD, R AFO, L BKA prosthesis modified independent (Target Date: 02/11/14)    Time 2   Period Months   Status On-going   PT LONG TERM GOAL #5   Title Berg Balance >36/56 (Target Date: 02/11/14)   Time 2   Period Months   Status On-going          Plan - 12/23/13 0845    Clinical Impression Statement Pt now released from wound care center/md to bear weight on right foot. Educated pt on sink HEP today for proprioception and balance. Pt quick to fatique and needed min assist to  stand/maintain balance at times. Pt's family present and educated on how to assist him with HEP.                                    Pt will benefit from skilled therapeutic intervention in order to improve on the following deficits Abnormal gait;Impaired flexibility;Decreased mobility;Decreased activity tolerance;Decreased endurance;Decreased range of motion;Decreased strength;Decreased balance;Difficulty walking;Decreased knowledge of use of DME  prosthetic dependency   Rehab Potential Good   PT Frequency 2x / week   PT Duration 8 weeks   PT Treatment/Interventions ADLs/Self Care Home Management;Gait training;Therapeutic exercise;Patient/family education;Energy conservation;Balance training;Stair training;Functional mobility training;Neuromuscular re-education;Manual techniques;Other (comment)  prosthetic training   PT Next Visit Plan Progress standing tolerance and posture, prosthetic weight bearing. Work on knee extension bil legs (stretching).   PT Home Exercise Plan HS stretches in long sitting; quad sets in long sitting   Consulted and Agree with Plan of Care Patient;Family member/caregiver   Family Member Consulted Brother and daughter          Problem List Patient Active Problem List   Diagnosis Date Noted  . Phantom limb 12/14/2013  . Dehiscence of amputation stump 07/23/2013  . Type 1 diabetes mellitus with diabetic foot infection   . Diabetes mellitus with peripheral vascular disease   . ESRD on hemodialysis   . BKA stump complication 038/10/1749 . Asthma 05/17/2013  .  S/P BKA (below knee amputation) unilateral 05/17/2013  . FTT (failure to thrive) in adult 05/01/2013  . History of MRSA infection 04/22/2013  . Peripheral vascular disease, unspecified 11/19/2012  . End-stage renal disease on hemodialysis 05/05/2012  . End stage kidney disease 05/05/2012  . Kahler disease 03/30/2012  . Anemia in chronic kidney disease 03/19/2012  . Iron deficiency 03/19/2012  . Hypothyroidism 03/19/2012  . Anemia, iron deficiency 03/19/2012  . Hypertension 03/18/2012  . Chronic kidney disease (CKD), stage V 03/18/2012  . Benign hypertension 03/18/2012  . Cardiac conduction disorder 03/18/2012  . Insulin dependent type 1 diabetes mellitus 03/18/2012  . MGUS (monoclonal gammopathy of unknown significance) 02/28/2011  . Monoclonal paraproteinemia 02/28/2011    Willow Ora 12/23/2013, 7:29 PM   Willow Ora, PTA, [place] [address] [place], [place] [address] [phone] 12/23/2013, 7:30 PM

## 2013-12-23 NOTE — Patient Instructions (Addendum)
Do each exercise 1-2 times per day Do each exercise 5-15 repetitions- work your way up to the higher number, start with 5 reps for now Hold each exercise for 5 seconds to feel your location  AT [place].  USE TAPE ON FLOOR TO MARK THE MIDLINE POSITION. You also should try to feel with your limb pressure in socket.  You are trying to feel with limb what you used to feel with the bottom of your foot.  1. Side to Side Shift: Moving your hips only (not shoulders): move weight onto your left leg, HOLD/FEEL.  Move back to equal weight on each leg, HOLD/FEEL. Move weight onto your right leg, HOLD/FEEL. Move back to equal weight on each leg, HOLD/FEEL. Repeat. 2. Front to Back Shift: Moving your hips only (not shoulders): move your weight forward onto your toes, HOLD/FEEL. Move your weight back to equal Flat Foot on both legs, HOLD/FEEL. Move your weight back onto your heels, HOLD/FEEL. Move your weight back to equal on both legs, HOLD/FEEL. Repeat. 3. Moving Cones / Cups: With equal weight on each leg: Hold on with one hand the first time, then progress to no hand supports. Move cups from one side of sink to the other. Place cups ~2" out of your reach, progress to 10" beyond reach. 4. Overhead/Upward Reaching: alternated reaching up to top cabinets or ceiling if no cabinets present. Keep equal weight on each leg. Start with one hand support on counter while other hand reaches and progress to no hand support with reaching. 5.   Looking Over Shoulders: With equal weight on each leg: alternate turning to look  over your shoulders with one hand support on counter as needed. Shift weight to opposite side looking, pull hip then shoulder then head/eyes around to look behind you. Start with one hand support & progress to no hand support.  *Do this with assistance of family member for now for safety

## 2013-12-24 ENCOUNTER — Ambulatory Visit (HOSPITAL_COMMUNITY)
Admission: RE | Admit: 2013-12-24 | Discharge: 2013-12-24 | Disposition: A | Discharge status: Home / Self Care | Payer: Medicare Other | Source: Ambulatory Visit | Attending: General Surgery | Admitting: General Surgery

## 2013-12-24 DIAGNOSIS — M7731 Calcaneal spur, right foot: Secondary | ICD-10-CM | POA: Diagnosis present

## 2013-12-24 DIAGNOSIS — L97519 Non-pressure chronic ulcer of other part of right foot with unspecified severity: Secondary | ICD-10-CM

## 2013-12-28 ENCOUNTER — Encounter: Payer: Self-pay | Admitting: Physical Therapy

## 2013-12-28 ENCOUNTER — Ambulatory Visit: Payer: Medicare Other | Admitting: Physical Therapy

## 2013-12-28 DIAGNOSIS — L97419 Non-pressure chronic ulcer of right heel and midfoot with unspecified severity: Secondary | ICD-10-CM | POA: Diagnosis not present

## 2013-12-28 DIAGNOSIS — E1151 Type 2 diabetes mellitus with diabetic peripheral angiopathy without gangrene: Secondary | ICD-10-CM | POA: Diagnosis not present

## 2013-12-28 DIAGNOSIS — R269 Unspecified abnormalities of gait and mobility: Secondary | ICD-10-CM

## 2013-12-28 DIAGNOSIS — E11621 Type 2 diabetes mellitus with foot ulcer: Secondary | ICD-10-CM | POA: Diagnosis not present

## 2013-12-28 DIAGNOSIS — M25669 Stiffness of unspecified knee, not elsewhere classified: Secondary | ICD-10-CM

## 2013-12-28 DIAGNOSIS — Z89512 Acquired absence of left leg below knee: Secondary | ICD-10-CM

## 2013-12-28 DIAGNOSIS — Z89431 Acquired absence of right foot: Secondary | ICD-10-CM

## 2013-12-28 DIAGNOSIS — R6889 Other general symptoms and signs: Secondary | ICD-10-CM

## 2013-12-28 DIAGNOSIS — I10 Essential (primary) hypertension: Secondary | ICD-10-CM | POA: Diagnosis not present

## 2013-12-28 NOTE — Therapy (Signed)
[facility] [address] [place], [place], [address] Phone: [phone]   Fax:  [phone]  Physical Therapy Treatment  Patient Details  Name: Jack [name] MRN: [id number] Date of Birth: 13-Jan-1946  Encounter Date: 12/28/2013      PT End of Session - 12/28/13 0800    Visit Number 5   Number of Visits 18   Date for PT Re-Evaluation 02/11/14   PT Start Time 0805   PT Stop Time 0845   PT Time Calculation (min) 40 min   Equipment Utilized During Treatment Gait belt   Activity Tolerance Patient tolerated treatment well   Behavior During Therapy [facility] for tasks assessed/performed      Past Medical History  Diagnosis Date  . Hypertension   . Pneumonia     2012  . Heart murmur   . Glaucoma   . Multiple myeloma, without mention of having achieved remission 03/30/2012    Cytogenetic neg on 03/23/2012.  . Asthma   . Hyperparathyroidism, secondary renal   . Peripheral arterial disease   . End-stage renal disease on hemodialysis     Started HD March 2014.  Cause of ESRD was DM.  Gets HD at [facility] on [place] on MWF schedule.   . Thyroid disease     hyperparathyroidism  . MRSA bacteremia   . Anemia   . Peripheral vascular disease, unspecified 11/19/2012    In the past had R foot toe amps then R TMA. In 2015 had left foot toe amp > then TMA >then L BKA on 05/14/13   . History of MRSA infection 04/22/2013    Bacteremia assoc w L foot wound infection Mar 2015   . Gangrene of foot   . ESRD on hemodialysis   . Diabetes mellitus without complication     Type 2  . Diabetes mellitus with peripheral vascular disease     Past Surgical History  Procedure Laterality Date  . Thyroidectomy    . Cervical disc surgery    . Insertion of dialysis catheter Right 03/19/2012    Procedure: INSERTION OF DIALYSIS CATHETER;  Surgeon: Mal Misty, MD;  Location: [place];  Service: Vascular;  Laterality: Right;  Right Internal Jugular  . Av  fistula placement Left 03/25/2012    Procedure: ARTERIOVENOUS (AV) FISTULA CREATION;  Surgeon: Mal Misty, MD;  Location: [place];  Service: Vascular;  Laterality: Left;  . Ligation of competing branches of arteriovenous fistula Left 05/08/2012    Procedure: LIGATION OF COMPETING BRANCHES OF ARTERIOVENOUS FISTULA;  Surgeon: Mal Misty, MD;  Location: [place];  Service: Vascular;  Laterality: Left;  Ultrasound guided  . Cardiac catheterization      approx 30 years ago  . Amputation Right 11/10/2012    Procedure: AMPUTATION FIRST and SECOND TOES Right Foot;  Surgeon: Elam Dutch, MD;  Location: [facility] OR;  Service: Vascular;  Laterality: Right;  . Transmetatarsal amputation Left 12/16/2012    Procedure: TRANSMETATARSAL AMPUTATION AND VAC PLACEMENT;  Surgeon: Elam Dutch, MD;  Location: [place];  Service: Vascular;  Laterality: Left;  . Amputation Left 04/07/2013    Procedure: AMPUTATION DIGIT- LEFT 1ST TOE;  Surgeon: Mal Misty, MD;  Location: [place];  Service: Vascular;  Laterality: Left;  . Tee without cardioversion N/A 04/20/2013    Procedure: TRANSESOPHAGEAL ECHOCARDIOGRAM (TEE);  Surgeon: Josue Hector, MD;  Location: [place];  Service: Cardiovascular;  Laterality: N/A;  . I&d extremity Left 04/22/2013  Procedure: IRRIGATION AND DEBRIDEMENT LEFT FIRST TOE AMPUTATION WOUND ;  Surgeon: Mal Misty, MD;  Location: [place];  Service: Vascular;  Laterality: Left;  . Amputation Left 04/26/2013    Procedure: Left Foot Transmetatarsal Amputation;  Surgeon: Newt Minion, MD;  Location: [place];  Service: Orthopedics;  Laterality: Left;  . Toe amputation      D/C 04-30-13  . Below knee leg amputation Left 05/14/2013    DR [name]  . Amputation Left 05/14/2013    Procedure: AMPUTATION BELOW KNEE;  Surgeon: Newt Minion, MD;  Location: [place];  Service: Orthopedics;  Laterality: Left;  Left Below Knee Amputation  . Eye surgery Bilateral     CATARACTS  . Amputation Left 07/23/2013    Procedure:  AMPUTATION BELOW KNEE;  Surgeon: Newt Minion, MD;  Location: [place];  Service: Orthopedics;  Laterality: Left;  Left Below Knee Amputation Revision  . Abdominal aortagram Bilateral 11/06/2012    Procedure: ABDOMINAL AORTAGRAM;  Surgeon: Elam Dutch, MD;  Location: [facility] CATH LAB;  Service: Cardiovascular;  Laterality: Bilateral;    There were no vitals taken for this visit.  Visit Diagnosis:  Abnormality of gait  Activity intolerance  Status post below knee amputation of left lower extremity  Decreased ROM of lower extremity  S/P transmetatarsal amputation of foot, right      Subjective Assessment - 12/28/13 0808    Symptoms wore prosthesis total 5 hrs/day.    Currently in Pain? Yes   Pain Score 8    Pain Location Foot   Pain Descriptors / Indicators Aching;Sore   Pain Type Other (Comment)  wound pain - same as has been   Pain Onset More than a month ago   Pain Frequency Constant   Multiple Pain Sites No            [facility] Adult PT Treatment/Exercise - 12/28/13 0800    Transfers   Transfers Sit to Stand;Stand to Sit   Sit to Stand 4: Min assist;With upper extremity assist;With armrests;From chair/3-in-1   Sit to Stand Details (indicate cue type and reason) cues on wt shift   Stand to Sit 4: Min guard;With upper extremity assist;With armrests;To chair/3-in-1   Ambulation/Gait   Ambulation/Gait Yes   Ambulation/Gait Assistance 4: Min assist   Ambulation Distance (Feet) 15 Feet   Assistive device Parallel bars  prosthesis   Knee/Hip Exercises: Stretches   Passive Hamstring Stretch 30 seconds;2 reps   Passive Hamstring Stretch Limitations PT tactile / verbal cues   Knee/Hip Exercises: Aerobic   Stationary Bike NuStep Level 2 with UEs & LEs X 85mn   Knee/Hip Exercises: Seated   Other Seated Knee Exercises Quad set 5 sec hold 10 reps   Prosthetics   Current prosthetic wear tolerance (days/week)  7 days /wk   Current prosthetic wear tolerance (#hours/day)  3 hours  2x day   Residual limb condition  dry scaly skin, no open areas,           PT Education - 12/28/13 0930    Education provided Yes   Education Details HEP   Person(s) Educated Patient;Other (comment)  brother   Methods Explanation;Demonstration;Handout   Comprehension Verbalized understanding;Returned demonstration          PT Short Term Goals - 12/28/13 0800    PT SHORT TERM GOAL #1   Title Ambulate 50 feet with RW, R AFO, L BKA prosthesis with supervision. (Target Date: 01/13/14)   Time 1   Period  Months   Status On-going   PT SHORT TERM GOAL #2   Title Pt will negotiate up/down ramp and curb with minimal assistance, RW, R AFO, and L BKA prosthesis  (Target Date: 01/13/14)   Time 1   Period Months   Status On-going   PT SHORT TERM GOAL #3   Title Patient demonstrates & verbalizes proper prosthetic donning of bilateral prostheses modified independent.  (Target Date: 01/13/14)   Time 1   Period Months   Status On-going   PT SHORT TERM GOAL #4   Title Patient will tolerate prosthetic wear time of >8 hours/day  (Target Date: 01/13/14)   Time 1   Period Months   Status On-going   PT SHORT TERM GOAL #5   Title Patient stands /sits, maintains standing balance with walker & reaches >5" with walker without balance loss with supervision.  (Target Date: 01/13/14)   Time 1   Period Months   Status On-going          PT Long Term Goals - 12/28/13 0800    PT LONG TERM GOAL #1   Title Tolerates wear of prostheses >90% of awake hours without change in skin integrity nor undue tenderness. (Target Date: 02/11/14)   Time 2   Period Months   Status On-going   PT LONG TERM GOAL #2   Title Patient will ambulate 279f with LRAD, R AFO, L BKA prosthesis modified independent (Target Date: 02/11/14)   Time 2   Period Months   Status On-going   PT LONG TERM GOAL #3   Title Patient verbalizes & demonstrates proper prosthetic care. (Target Date: 02/11/14)   Time 2   Period Months    Status On-going   PT LONG TERM GOAL #4   Title Patient will negotiate up/down ramp and curb with LRAD, R AFO, L BKA prosthesis modified independent (Target Date: 02/11/14)   Time 2   Period Months   Status On-going   PT LONG TERM GOAL #5   Title Berg Balance >36/56 (Target Date: 02/11/14)   Time 2   Period Months   Status On-going          Plan - 12/28/13 0800    Clinical Impression Statement Patient seems to understand the HEP to work on knee [place] Patient tolerated pressure on right foot without issues.   Pt will benefit from skilled therapeutic intervention in order to improve on the following deficits Abnormal gait;Impaired flexibility;Decreased mobility;Decreased activity tolerance;Decreased endurance;Decreased range of motion;Decreased strength;Decreased balance;Difficulty walking;Decreased knowledge of use of DME  prosthetic dependency   Rehab Potential Good   PT Frequency 2x / week   PT Duration 8 weeks   PT Treatment/Interventions ADLs/Self Care Home Management;Gait training;Therapeutic exercise;Patient/family education;Energy conservation;Balance training;Stair training;Functional mobility training;Neuromuscular re-education;Manual techniques;Other (comment)  prosthetic training   PT Next Visit Plan Progress standing tolerance and posture, prosthetic weight bearing. Work on knee extension bil legs (stretching).   PT Home Exercise Plan HS stretches in long sitting; quad sets in long sitting   Consulted and Agree with Plan of Care Patient;Family member/caregiver   Family Member Consulted Brother         Problem List Patient Active Problem List   Diagnosis Date Noted  . Phantom limb 12/14/2013  . Dehiscence of amputation stump 07/23/2013  . Type 1 diabetes mellitus with diabetic foot infection   . Diabetes mellitus with peripheral vascular disease   . ESRD on hemodialysis   . BKA stump complication 020/[id number] . Asthma  05/17/2013  . S/P BKA (below knee  amputation) unilateral 05/17/2013  . FTT (failure to thrive) in adult 05/01/2013  . History of MRSA infection 04/22/2013  . Peripheral vascular disease, unspecified 11/19/2012  . End-stage renal disease on hemodialysis 05/05/2012  . End stage kidney disease 05/05/2012  . Kahler disease 03/30/2012  . Anemia in chronic kidney disease 03/19/2012  . Iron deficiency 03/19/2012  . Hypothyroidism 03/19/2012  . Anemia, iron deficiency 03/19/2012  . Hypertension 03/18/2012  . Chronic kidney disease (CKD), stage V 03/18/2012  . Benign hypertension 03/18/2012  . Cardiac conduction disorder 03/18/2012  . Insulin dependent type 1 diabetes mellitus 03/18/2012  . MGUS (monoclonal gammopathy of unknown significance) 02/28/2011  . Monoclonal paraproteinemia 02/28/2011    Latoya Maulding 12/28/2013, 12:15 PM   Jamey Reas, PT, DPT PT Specializing in [place] 12/28/2013 12:16 PM Phone:  [phone]  Fax:  [phone] [place] [address] [place], [place] [address]

## 2013-12-28 NOTE — Patient Instructions (Signed)
Hamstring Stretch   Sit with foot on stool. Lift foot off stool and press leg out as straight as possible.  With one heel on step, knee straight, press both hands to top of knee to help keep it straight.  Head up,  Lean forward from hip. Hold _30__ seconds. Perform _2__ reps.  Copyright  VHI. All rights reserved.  Antiemboli: Isometric   Sitting with foot on stool as above. Pull toes toward left knee, tense muscles on front of thigh pushing knee downward and simultaneously squeeze buttocks.  Hold _5___ seconds. Repeat __10__ times per set. Do _1-2___ sets per session. Do __1-3__ sessions per day.  http://orth.exer.us/708   Copyright  VHI. All rights reserved.

## 2013-12-30 ENCOUNTER — Ambulatory Visit: Payer: Medicare Other | Admitting: Physical Therapy

## 2013-12-30 DIAGNOSIS — E11621 Type 2 diabetes mellitus with foot ulcer: Secondary | ICD-10-CM | POA: Diagnosis not present

## 2013-12-30 DIAGNOSIS — L97419 Non-pressure chronic ulcer of right heel and midfoot with unspecified severity: Secondary | ICD-10-CM | POA: Diagnosis not present

## 2013-12-30 DIAGNOSIS — E1151 Type 2 diabetes mellitus with diabetic peripheral angiopathy without gangrene: Secondary | ICD-10-CM | POA: Diagnosis not present

## 2013-12-30 DIAGNOSIS — I10 Essential (primary) hypertension: Secondary | ICD-10-CM | POA: Diagnosis not present

## 2013-12-31 DIAGNOSIS — L97419 Non-pressure chronic ulcer of right heel and midfoot with unspecified severity: Secondary | ICD-10-CM | POA: Diagnosis not present

## 2013-12-31 DIAGNOSIS — E11621 Type 2 diabetes mellitus with foot ulcer: Secondary | ICD-10-CM | POA: Diagnosis not present

## 2013-12-31 DIAGNOSIS — E1151 Type 2 diabetes mellitus with diabetic peripheral angiopathy without gangrene: Secondary | ICD-10-CM | POA: Diagnosis not present

## 2013-12-31 DIAGNOSIS — I10 Essential (primary) hypertension: Secondary | ICD-10-CM | POA: Diagnosis not present

## 2013-12-31 LAB — GLUCOSE, CAPILLARY
GLUCOSE-CAPILLARY: 131 mg/dL — AB (ref 70–99)
GLUCOSE-CAPILLARY: 286 mg/dL — AB (ref 70–99)
GLUCOSE-CAPILLARY: 277 mg/dL — AB (ref 70–99)
Glucose-Capillary: 136 mg/dL — ABNORMAL HIGH (ref 70–99)

## 2014-01-03 DIAGNOSIS — E11621 Type 2 diabetes mellitus with foot ulcer: Secondary | ICD-10-CM | POA: Diagnosis not present

## 2014-01-03 DIAGNOSIS — L97419 Non-pressure chronic ulcer of right heel and midfoot with unspecified severity: Secondary | ICD-10-CM | POA: Diagnosis not present

## 2014-01-03 DIAGNOSIS — I10 Essential (primary) hypertension: Secondary | ICD-10-CM | POA: Diagnosis not present

## 2014-01-03 DIAGNOSIS — E1151 Type 2 diabetes mellitus with diabetic peripheral angiopathy without gangrene: Secondary | ICD-10-CM | POA: Diagnosis not present

## 2014-01-03 LAB — GLUCOSE, CAPILLARY: GLUCOSE-CAPILLARY: 187 mg/dL — AB (ref 70–99)

## 2014-01-04 ENCOUNTER — Encounter (HOSPITAL_COMMUNITY): Payer: Self-pay | Admitting: Emergency Medicine

## 2014-01-04 ENCOUNTER — Inpatient Hospital Stay (HOSPITAL_COMMUNITY): Payer: Medicare Other

## 2014-01-04 ENCOUNTER — Ambulatory Visit: Payer: Medicare Other | Admitting: Physical Therapy

## 2014-01-04 ENCOUNTER — Encounter: Payer: Self-pay | Admitting: Physical Therapy

## 2014-01-04 ENCOUNTER — Inpatient Hospital Stay (HOSPITAL_COMMUNITY)
Admission: EM | Admit: 2014-01-04 | Discharge: 2014-01-06 | DRG: 503 | Disposition: A | Discharge status: Home / Self Care | Payer: Medicare Other | Attending: Internal Medicine | Admitting: Internal Medicine

## 2014-01-04 DIAGNOSIS — E1051 Type 1 diabetes mellitus with diabetic peripheral angiopathy without gangrene: Secondary | ICD-10-CM | POA: Diagnosis present

## 2014-01-04 DIAGNOSIS — Z888 Allergy status to other drugs, medicaments and biological substances status: Secondary | ICD-10-CM | POA: Diagnosis not present

## 2014-01-04 DIAGNOSIS — Z91041 Radiographic dye allergy status: Secondary | ICD-10-CM

## 2014-01-04 DIAGNOSIS — Z91030 Bee allergy status: Secondary | ICD-10-CM

## 2014-01-04 DIAGNOSIS — Z833 Family history of diabetes mellitus: Secondary | ICD-10-CM

## 2014-01-04 DIAGNOSIS — M86171 Other acute osteomyelitis, right ankle and foot: Principal | ICD-10-CM | POA: Diagnosis present

## 2014-01-04 DIAGNOSIS — Z79899 Other long term (current) drug therapy: Secondary | ICD-10-CM

## 2014-01-04 DIAGNOSIS — R112 Nausea with vomiting, unspecified: Secondary | ICD-10-CM | POA: Diagnosis present

## 2014-01-04 DIAGNOSIS — Z8614 Personal history of Methicillin resistant Staphylococcus aureus infection: Secondary | ICD-10-CM

## 2014-01-04 DIAGNOSIS — N2581 Secondary hyperparathyroidism of renal origin: Secondary | ICD-10-CM | POA: Diagnosis present

## 2014-01-04 DIAGNOSIS — D472 Monoclonal gammopathy: Secondary | ICD-10-CM | POA: Diagnosis present

## 2014-01-04 DIAGNOSIS — E890 Postprocedural hypothyroidism: Secondary | ICD-10-CM | POA: Diagnosis present

## 2014-01-04 DIAGNOSIS — E1151 Type 2 diabetes mellitus with diabetic peripheral angiopathy without gangrene: Secondary | ICD-10-CM | POA: Diagnosis not present

## 2014-01-04 DIAGNOSIS — Z89411 Acquired absence of right great toe: Secondary | ICD-10-CM | POA: Diagnosis not present

## 2014-01-04 DIAGNOSIS — J45909 Unspecified asthma, uncomplicated: Secondary | ICD-10-CM | POA: Diagnosis present

## 2014-01-04 DIAGNOSIS — E119 Type 2 diabetes mellitus without complications: Secondary | ICD-10-CM

## 2014-01-04 DIAGNOSIS — I10 Essential (primary) hypertension: Secondary | ICD-10-CM | POA: Diagnosis present

## 2014-01-04 DIAGNOSIS — Z794 Long term (current) use of insulin: Secondary | ICD-10-CM

## 2014-01-04 DIAGNOSIS — N186 End stage renal disease: Secondary | ICD-10-CM | POA: Diagnosis present

## 2014-01-04 DIAGNOSIS — Z89421 Acquired absence of other right toe(s): Secondary | ICD-10-CM | POA: Diagnosis not present

## 2014-01-04 DIAGNOSIS — Z89512 Acquired absence of left leg below knee: Secondary | ICD-10-CM

## 2014-01-04 DIAGNOSIS — E1042 Type 1 diabetes mellitus with diabetic polyneuropathy: Secondary | ICD-10-CM | POA: Diagnosis present

## 2014-01-04 DIAGNOSIS — Z992 Dependence on renal dialysis: Secondary | ICD-10-CM | POA: Diagnosis not present

## 2014-01-04 DIAGNOSIS — E039 Hypothyroidism, unspecified: Secondary | ICD-10-CM | POA: Diagnosis present

## 2014-01-04 DIAGNOSIS — R6889 Other general symptoms and signs: Secondary | ICD-10-CM

## 2014-01-04 DIAGNOSIS — R269 Unspecified abnormalities of gait and mobility: Secondary | ICD-10-CM

## 2014-01-04 DIAGNOSIS — C9000 Multiple myeloma not having achieved remission: Secondary | ICD-10-CM | POA: Diagnosis present

## 2014-01-04 DIAGNOSIS — E11621 Type 2 diabetes mellitus with foot ulcer: Secondary | ICD-10-CM | POA: Diagnosis present

## 2014-01-04 DIAGNOSIS — M25669 Stiffness of unspecified knee, not elsewhere classified: Secondary | ICD-10-CM

## 2014-01-04 DIAGNOSIS — I120 Hypertensive chronic kidney disease with stage 5 chronic kidney disease or end stage renal disease: Secondary | ICD-10-CM | POA: Diagnosis present

## 2014-01-04 DIAGNOSIS — F418 Other specified anxiety disorders: Secondary | ICD-10-CM | POA: Diagnosis not present

## 2014-01-04 DIAGNOSIS — L97419 Non-pressure chronic ulcer of right heel and midfoot with unspecified severity: Secondary | ICD-10-CM | POA: Diagnosis present

## 2014-01-04 DIAGNOSIS — Z885 Allergy status to narcotic agent status: Secondary | ICD-10-CM | POA: Diagnosis not present

## 2014-01-04 DIAGNOSIS — N189 Chronic kidney disease, unspecified: Secondary | ICD-10-CM

## 2014-01-04 DIAGNOSIS — Z7982 Long term (current) use of aspirin: Secondary | ICD-10-CM | POA: Diagnosis not present

## 2014-01-04 DIAGNOSIS — Z9889 Other specified postprocedural states: Secondary | ICD-10-CM

## 2014-01-04 DIAGNOSIS — E10621 Type 1 diabetes mellitus with foot ulcer: Secondary | ICD-10-CM | POA: Diagnosis present

## 2014-01-04 DIAGNOSIS — D631 Anemia in chronic kidney disease: Secondary | ICD-10-CM | POA: Diagnosis present

## 2014-01-04 DIAGNOSIS — Z87891 Personal history of nicotine dependence: Secondary | ICD-10-CM | POA: Diagnosis not present

## 2014-01-04 DIAGNOSIS — E1065 Type 1 diabetes mellitus with hyperglycemia: Secondary | ICD-10-CM | POA: Diagnosis present

## 2014-01-04 DIAGNOSIS — H469 Unspecified optic neuritis: Secondary | ICD-10-CM | POA: Diagnosis not present

## 2014-01-04 DIAGNOSIS — L089 Local infection of the skin and subcutaneous tissue, unspecified: Secondary | ICD-10-CM

## 2014-01-04 DIAGNOSIS — Z89511 Acquired absence of right leg below knee: Secondary | ICD-10-CM

## 2014-01-04 DIAGNOSIS — I739 Peripheral vascular disease, unspecified: Secondary | ICD-10-CM | POA: Diagnosis present

## 2014-01-04 DIAGNOSIS — E11628 Type 2 diabetes mellitus with other skin complications: Secondary | ICD-10-CM | POA: Diagnosis present

## 2014-01-04 DIAGNOSIS — M869 Osteomyelitis, unspecified: Secondary | ICD-10-CM | POA: Diagnosis present

## 2014-01-04 DIAGNOSIS — Z89431 Acquired absence of right foot: Secondary | ICD-10-CM

## 2014-01-04 DIAGNOSIS — L97412 Non-pressure chronic ulcer of right heel and midfoot with fat layer exposed: Secondary | ICD-10-CM

## 2014-01-04 LAB — CBC WITH DIFFERENTIAL/PLATELET
Basophils Absolute: 0 10*3/uL (ref 0.0–0.1)
Basophils Relative: 0 % (ref 0–1)
EOS ABS: 0.1 10*3/uL (ref 0.0–0.7)
Eosinophils Relative: 1 % (ref 0–5)
HCT: 41.8 % (ref 39.0–52.0)
HEMOGLOBIN: 13.5 g/dL (ref 13.0–17.0)
Lymphocytes Relative: 12 % (ref 12–46)
Lymphs Abs: 1.2 10*3/uL (ref 0.7–4.0)
MCH: 27.9 pg (ref 26.0–34.0)
MCHC: 32.3 g/dL (ref 30.0–36.0)
MCV: 86.4 fL (ref 78.0–100.0)
MONO ABS: 0.7 10*3/uL (ref 0.1–1.0)
MONOS PCT: 7 % (ref 3–12)
NEUTROS PCT: 80 % — AB (ref 43–77)
Neutro Abs: 7.9 10*3/uL — ABNORMAL HIGH (ref 1.7–7.7)
Platelets: 290 10*3/uL (ref 150–400)
RBC: 4.84 MIL/uL (ref 4.22–5.81)
RDW: 14 % (ref 11.5–15.5)
WBC: 9.9 10*3/uL (ref 4.0–10.5)

## 2014-01-04 LAB — GLUCOSE, CAPILLARY
GLUCOSE-CAPILLARY: 107 mg/dL — AB (ref 70–99)
Glucose-Capillary: 198 mg/dL — ABNORMAL HIGH (ref 70–99)
Glucose-Capillary: 138 mg/dL — ABNORMAL HIGH (ref 70–99)

## 2014-01-04 LAB — CBC
HEMATOCRIT: 37.5 % — AB (ref 39.0–52.0)
HEMOGLOBIN: 11.7 g/dL — AB (ref 13.0–17.0)
MCH: 26.2 pg (ref 26.0–34.0)
MCHC: 31.2 g/dL (ref 30.0–36.0)
MCV: 84.1 fL (ref 78.0–100.0)
Platelets: 274 10*3/uL (ref 150–400)
RBC: 4.46 MIL/uL (ref 4.22–5.81)
RDW: 14 % (ref 11.5–15.5)
WBC: 10.5 10*3/uL (ref 4.0–10.5)

## 2014-01-04 LAB — CREATININE, SERUM
CREATININE: 11.17 mg/dL — AB (ref 0.50–1.35)
GFR calc Af Amer: 5 mL/min — ABNORMAL LOW (ref >90)
GFR calc non Af Amer: 4 mL/min — ABNORMAL LOW (ref >90)

## 2014-01-04 LAB — BASIC METABOLIC PANEL
Anion gap: 19 — ABNORMAL HIGH (ref 5–15)
BUN: 48 mg/dL — AB (ref 6–23)
CO2: 27 mmol/L (ref 19–32)
CREATININE: 10.98 mg/dL — AB (ref 0.50–1.35)
Calcium: 9.8 mg/dL (ref 8.4–10.5)
Chloride: 92 mEq/L — ABNORMAL LOW (ref 96–112)
GFR, EST AFRICAN AMERICAN: 5 mL/min — AB (ref >90)
GFR, EST NON AFRICAN AMERICAN: 4 mL/min — AB (ref >90)
Glucose, Bld: 139 mg/dL — ABNORMAL HIGH (ref 70–99)
Potassium: 4.5 mmol/L (ref 3.5–5.1)
Sodium: 138 mmol/L (ref 135–145)

## 2014-01-04 LAB — SEDIMENTATION RATE: SED RATE: 69 mm/h — AB (ref 0–16)

## 2014-01-04 MED ORDER — SODIUM CHLORIDE 0.9 % IV SOLN
250.0000 mL | INTRAVENOUS | Status: DC | PRN
  Administered 2014-01-05: 18:00:00 via INTRAVENOUS

## 2014-01-04 MED ORDER — ASPIRIN EC 81 MG PO TBEC
81.0000 mg | DELAYED_RELEASE_TABLET | Freq: Every day | ORAL | Status: DC
  Administered 2014-01-04 – 2014-01-06 (×3): 81 mg via ORAL
  Filled 2014-01-04 (×4): qty 1

## 2014-01-04 MED ORDER — SODIUM CHLORIDE 0.9 % IJ SOLN: 3.0000 mL | Freq: Two times a day (BID) | INTRAMUSCULAR | Status: DC

## 2014-01-04 MED ORDER — RENA-VITE PO TABS
1.0000 | ORAL_TABLET | Freq: Every day | ORAL | Status: DC
  Filled 2014-01-04 (×2): qty 1

## 2014-01-04 MED ORDER — SODIUM CHLORIDE 0.9 % IJ SOLN: 3.0000 mL | INTRAMUSCULAR | Status: DC | PRN

## 2014-01-04 MED ORDER — SODIUM CHLORIDE 0.9 % IV BOLUS (SEPSIS): 1000.0000 mL | Freq: Once | INTRAVENOUS | Status: DC

## 2014-01-04 MED ORDER — BOOST / RESOURCE BREEZE PO LIQD
1.0000 | Freq: Three times a day (TID) | ORAL | Status: DC
  Administered 2014-01-06: 1 via ORAL

## 2014-01-04 MED ORDER — SODIUM CHLORIDE 0.9 % IJ SOLN
3.0000 mL | Freq: Two times a day (BID) | INTRAMUSCULAR | Status: DC
  Administered 2014-01-04 – 2014-01-05 (×2): 3 mL via INTRAVENOUS

## 2014-01-04 MED ORDER — LANTHANUM CARBONATE 500 MG PO CHEW
1000.0000 mg | CHEWABLE_TABLET | Freq: Three times a day (TID) | ORAL | Status: DC
  Administered 2014-01-05 – 2014-01-06 (×2): 1000 mg via ORAL
  Filled 2014-01-04 (×7): qty 2

## 2014-01-04 MED ORDER — CALCIUM ACETATE 667 MG PO CAPS
1334.0000 mg | ORAL_CAPSULE | Freq: Three times a day (TID) | ORAL | Status: DC
  Administered 2014-01-05 – 2014-01-06 (×2): 1334 mg via ORAL
  Filled 2014-01-04 (×8): qty 2

## 2014-01-04 MED ORDER — PIPERACILLIN-TAZOBACTAM IN DEX 2-0.25 GM/50ML IV SOLN
2.2500 g | Freq: Three times a day (TID) | INTRAVENOUS | Status: DC
  Administered 2014-01-04 – 2014-01-06 (×5): 2.25 g via INTRAVENOUS
  Filled 2014-01-04 (×8): qty 50

## 2014-01-04 MED ORDER — INSULIN ASPART 100 UNIT/ML ~~LOC~~ SOLN
0.0000 [IU] | SUBCUTANEOUS | Status: DC
  Administered 2014-01-05: 2 [IU] via SUBCUTANEOUS

## 2014-01-04 MED ORDER — OXYCODONE HCL 5 MG PO TABS
5.0000 mg | ORAL_TABLET | Freq: Four times a day (QID) | ORAL | Status: DC | PRN
  Administered 2014-01-05 – 2014-01-06 (×2): 10 mg via ORAL
  Filled 2014-01-04 (×2): qty 2

## 2014-01-04 MED ORDER — GABAPENTIN 100 MG PO CAPS
200.0000 mg | ORAL_CAPSULE | Freq: Every day | ORAL | Status: DC
  Administered 2014-01-04 – 2014-01-05 (×2): 200 mg via ORAL
  Filled 2014-01-04 (×3): qty 2

## 2014-01-04 MED ORDER — HEPARIN SODIUM (PORCINE) 5000 UNIT/ML IJ SOLN
5000.0000 [IU] | Freq: Three times a day (TID) | INTRAMUSCULAR | Status: DC
  Administered 2014-01-04 – 2014-01-06 (×3): 5000 [IU] via SUBCUTANEOUS
  Filled 2014-01-04 (×7): qty 1

## 2014-01-04 MED ORDER — RENA-VITE PO TABS
1.0000 | ORAL_TABLET | Freq: Every day | ORAL | Status: DC
  Administered 2014-01-04 – 2014-01-05 (×2): 1 via ORAL
  Filled 2014-01-04 (×3): qty 1

## 2014-01-04 MED ORDER — LEVOTHYROXINE SODIUM 175 MCG PO TABS
175.0000 ug | ORAL_TABLET | Freq: Every day | ORAL | Status: DC
  Administered 2014-01-05 – 2014-01-06 (×2): 175 ug via ORAL
  Filled 2014-01-04 (×3): qty 1

## 2014-01-04 MED ORDER — VANCOMYCIN HCL 10 G IV SOLR
1750.0000 mg | Freq: Once | INTRAVENOUS | Status: AC
  Administered 2014-01-04: 1750 mg via INTRAVENOUS
  Filled 2014-01-04: qty 1750

## 2014-01-04 MED ORDER — COLCHICINE 0.6 MG PO TABS
0.6000 mg | ORAL_TABLET | Freq: Every day | ORAL | Status: DC
  Administered 2014-01-04 – 2014-01-05 (×2): 0.6 mg via ORAL
  Filled 2014-01-04 (×3): qty 1

## 2014-01-04 MED ORDER — VANCOMYCIN HCL IN DEXTROSE 750-5 MG/150ML-% IV SOLN
750.0000 mg | INTRAVENOUS | Status: DC
  Administered 2014-01-05: 750 mg via INTRAVENOUS
  Filled 2014-01-04: qty 150

## 2014-01-04 NOTE — Consult Note (Signed)
Reason for Consult: right foot ulcer with osteo Referring Physician: EDP  Jack [name] is an 68 y.o. male.  HPI:   68 yo male diabetic with a history of a right foot ulcer at his lateral calcaneus followed by the [place].  Has been feeling sick with nausea today.  Was sent to the ED and a MRI shows questionable acute osteo.  He does have a history of a right mid-foot amputation and a left BKA.  He has been followed in the past by Dr. Sharol Given - Ortho, who is out of town.  He did have some hypotension upon admission.  He is also a MWF dialysis patient.  He was started on Vanc and Zosyn.  Past Medical History  Diagnosis Date  . Hypertension   . Pneumonia     2012  . Heart murmur   . Glaucoma   . Multiple myeloma, without mention of having achieved remission 03/30/2012    Cytogenetic neg on 03/23/2012.  . Asthma   . Hyperparathyroidism, secondary renal   . Peripheral arterial disease   . End-stage renal disease on hemodialysis     Started HD March 2014.  Cause of ESRD was DM.  Gets HD at [facility] on [place] on MWF schedule.   . Thyroid disease     hyperparathyroidism  . MRSA bacteremia   . Anemia   . Peripheral vascular disease, unspecified 11/19/2012    In the past had R foot toe amps then R TMA. In 2015 had left foot toe amp > then TMA >then L BKA on 05/14/13   . History of MRSA infection 04/22/2013    Bacteremia assoc w L foot wound infection Mar 2015   . Gangrene of foot   . ESRD on hemodialysis   . Diabetes mellitus without complication     Type 2  . Diabetes mellitus with peripheral vascular disease     Past Surgical History  Procedure Laterality Date  . Thyroidectomy    . Cervical disc surgery    . Insertion of dialysis catheter Right 03/19/2012    Procedure: INSERTION OF DIALYSIS CATHETER;  Surgeon: Mal Misty, MD;  Location: [place];  Service: Vascular;  Laterality: Right;  Right Internal Jugular  . Av fistula placement Left 03/25/2012    Procedure: ARTERIOVENOUS  (AV) FISTULA CREATION;  Surgeon: Mal Misty, MD;  Location: [place];  Service: Vascular;  Laterality: Left;  . Ligation of competing branches of arteriovenous fistula Left 05/08/2012    Procedure: LIGATION OF COMPETING BRANCHES OF ARTERIOVENOUS FISTULA;  Surgeon: Mal Misty, MD;  Location: [place];  Service: Vascular;  Laterality: Left;  Ultrasound guided  . Cardiac catheterization      approx 30 years ago  . Amputation Right 11/10/2012    Procedure: AMPUTATION FIRST and SECOND TOES Right Foot;  Surgeon: Elam Dutch, MD;  Location: [facility] OR;  Service: Vascular;  Laterality: Right;  . Transmetatarsal amputation Left 12/16/2012    Procedure: TRANSMETATARSAL AMPUTATION AND VAC PLACEMENT;  Surgeon: Elam Dutch, MD;  Location: [place];  Service: Vascular;  Laterality: Left;  . Amputation Left 04/07/2013    Procedure: AMPUTATION DIGIT- LEFT 1ST TOE;  Surgeon: Mal Misty, MD;  Location: [place];  Service: Vascular;  Laterality: Left;  . Tee without cardioversion N/A 04/20/2013    Procedure: TRANSESOPHAGEAL ECHOCARDIOGRAM (TEE);  Surgeon: Josue Hector, MD;  Location: [place];  Service: Cardiovascular;  Laterality: N/A;  . I&d extremity Left 04/22/2013  Procedure: IRRIGATION AND DEBRIDEMENT LEFT FIRST TOE AMPUTATION WOUND ;  Surgeon: Mal Misty, MD;  Location: [place];  Service: Vascular;  Laterality: Left;  . Amputation Left 04/26/2013    Procedure: Left Foot Transmetatarsal Amputation;  Surgeon: Newt Minion, MD;  Location: [place];  Service: Orthopedics;  Laterality: Left;  . Toe amputation      D/C 04-30-13  . Below knee leg amputation Left 05/14/2013    DR [name]  . Amputation Left 05/14/2013    Procedure: AMPUTATION BELOW KNEE;  Surgeon: Newt Minion, MD;  Location: [place];  Service: Orthopedics;  Laterality: Left;  Left Below Knee Amputation  . Eye surgery Bilateral     CATARACTS  . Amputation Left 07/23/2013    Procedure: AMPUTATION BELOW KNEE;  Surgeon: Newt Minion, MD;  Location:  [place];  Service: Orthopedics;  Laterality: Left;  Left Below Knee Amputation Revision  . Abdominal aortagram Bilateral 11/06/2012    Procedure: ABDOMINAL AORTAGRAM;  Surgeon: Elam Dutch, MD;  Location: [facility] CATH LAB;  Service: Cardiovascular;  Laterality: Bilateral;    Family History  Problem Relation Age of Onset  . Diabetes Mother   . Cancer Mother     bone   . Kidney disease Father     Social History:  reports that he quit smoking about 31 years ago. His smoking use included Cigarettes. He smoked 0.00 packs per day. He has never used smokeless tobacco. He reports that he does not drink alcohol or use illicit drugs.  Allergies:  Allergies  Allergen Reactions  . Bee Venom Anaphylaxis  . Lisinopril Cough  . Ivp Dye [Iodinated Diagnostic Agents] Hives  . Morphine And Related Other (See Comments)    Bradycardia states patient    Medications: I have reviewed the patient's current medications.  Results for orders placed or performed during the hospital encounter of 01/04/14 (from the past 48 hour(s))  CBC with Differential     Status: Abnormal   Collection Time: 01/04/14  4:30 PM  Result Value Ref Range   WBC 9.9 4.0 - 10.5 K/uL   RBC 4.84 4.22 - 5.81 MIL/uL   Hemoglobin 13.5 13.0 - 17.0 g/dL   HCT 41.8 39.0 - 52.0 %   MCV 86.4 78.0 - 100.0 fL   MCH 27.9 26.0 - 34.0 pg   MCHC 32.3 30.0 - 36.0 g/dL   RDW 14.0 11.5 - 15.5 %   Platelets 290 150 - 400 K/uL   Neutrophils Relative % 80 (H) 43 - 77 %   Neutro Abs 7.9 (H) 1.7 - 7.7 K/uL   Lymphocytes Relative 12 12 - 46 %   Lymphs Abs 1.2 0.7 - 4.0 K/uL   Monocytes Relative 7 3 - 12 %   Monocytes Absolute 0.7 0.1 - 1.0 K/uL   Eosinophils Relative 1 0 - 5 %   Eosinophils Absolute 0.1 0.0 - 0.7 K/uL   Basophils Relative 0 0 - 1 %   Basophils Absolute 0.0 0.0 - 0.1 K/uL  Basic metabolic panel     Status: Abnormal   Collection Time: 01/04/14  4:30 PM  Result Value Ref Range   Sodium 138 135 - 145 mmol/L    Comment: Please  note change in reference range.   Potassium 4.5 3.5 - 5.1 mmol/L    Comment: Please note change in reference range.   Chloride 92 (L) 96 - 112 mEq/L   CO2 27 19 - 32 mmol/L   Glucose, Bld 139 (H)  70 - 99 mg/dL   BUN 48 (H) 6 - 23 mg/dL   Creatinine, Ser 10.98 (H) 0.50 - 1.35 mg/dL   Calcium 9.8 8.4 - 10.5 mg/dL   GFR calc non Af Amer 4 (L) >90 mL/min   GFR calc Af Amer 5 (L) >90 mL/min    Comment: (NOTE) The eGFR has been calculated using the CKD EPI equation. This calculation has not been validated in all clinical situations. eGFR's persistently <90 mL/min signify possible Chronic Kidney Disease.    Anion gap 19 (H) 5 - 15  Sedimentation rate     Status: Abnormal   Collection Time: 01/04/14  7:07 PM  Result Value Ref Range   Sed Rate 69 (H) 0 - 16 mm/hr  CBC     Status: Abnormal   Collection Time: 01/04/14  7:25 PM  Result Value Ref Range   WBC 10.5 4.0 - 10.5 K/uL   RBC 4.46 4.22 - 5.81 MIL/uL   Hemoglobin 11.7 (L) 13.0 - 17.0 g/dL   HCT 37.5 (L) 39.0 - 52.0 %   MCV 84.1 78.0 - 100.0 fL   MCH 26.2 26.0 - 34.0 pg   MCHC 31.2 30.0 - 36.0 g/dL   RDW 14.0 11.5 - 15.5 %   Platelets 274 150 - 400 K/uL  Creatinine, serum     Status: Abnormal   Collection Time: 01/04/14  7:25 PM  Result Value Ref Range   Creatinine, Ser 11.17 (H) 0.50 - 1.35 mg/dL   GFR calc non Af Amer 4 (L) >90 mL/min   GFR calc Af Amer 5 (L) >90 mL/min    Comment: (NOTE) The eGFR has been calculated using the CKD EPI equation. This calculation has not been validated in all clinical situations. eGFR's persistently <90 mL/min signify possible Chronic Kidney Disease.   Glucose, capillary     Status: Abnormal   Collection Time: 01/04/14  8:36 PM  Result Value Ref Range   Glucose-Capillary 107 (H) 70 - 99 mg/dL    No results found.  ROS Blood pressure 114/70, pulse 61, temperature 98.1 F (36.7 C), temperature source Oral, resp. rate 18, height '5\' 10"'  (1.778 m), weight 72.576 kg (160 lb), SpO2 100  %. Physical Exam  Musculoskeletal:       Feet:    Assessment/Plan: Right heel ulcer with necrosis and concern for osteomyelitis 1)  Will plan on an I&D in the OR tomorrow evening (after 5pm).  I spoke to he and his daughter in length about this and they understand fully.  Mcarthur Rossetti 01/04/2014, 9:01 PM

## 2014-01-04 NOTE — Progress Notes (Signed)
ANTIBIOTIC CONSULT NOTE - INITIAL  Pharmacy Consult for Vancomycin and Zosyn Indication: Osteomyelitis  Allergies  Allergen Reactions  . Bee Venom Anaphylaxis  . Lisinopril Cough  . Ivp Dye [Iodinated Diagnostic Agents] Hives  . Morphine And Related Other (See Comments)    Bradycardia states patient    Patient Measurements: Height: '5\' 10"'  (177.8 cm) Weight: 160 lb (72.576 kg) IBW/kg (Calculated) : 73  Vital Signs: Temp: 98.1 F (36.7 C) (12/22 1500) Temp Source: Oral (12/22 1500) BP: 114/70 mmHg (12/22 1930) Pulse Rate: 61 (12/22 1930) Intake/Output from previous day:   Intake/Output from this shift:    Labs:  Recent Labs  01/04/14 1630  WBC 9.9  HGB 13.5  PLT 290  CREATININE 10.98*   Estimated Creatinine Clearance: 6.4 mL/min (by C-G formula based on Cr of 10.98). No results for input(s): VANCOTROUGH, VANCOPEAK, VANCORANDOM, GENTTROUGH, GENTPEAK, GENTRANDOM, TOBRATROUGH, TOBRAPEAK, TOBRARND, AMIKACINPEAK, AMIKACINTROU, AMIKACIN in the last 72 hours.   Microbiology: No results found for this or any previous visit (from the past 720 hour(s)).  Medical History: Past Medical History  Diagnosis Date  . Hypertension   . Pneumonia     2012  . Heart murmur   . Glaucoma   . Multiple myeloma, without mention of having achieved remission 03/30/2012    Cytogenetic neg on 03/23/2012.  . Asthma   . Hyperparathyroidism, secondary renal   . Peripheral arterial disease   . End-stage renal disease on hemodialysis     Started HD March 2014.  Cause of ESRD was DM.  Gets HD at [facility] on [address] on MWF schedule.   . Thyroid disease     hyperparathyroidism  . MRSA bacteremia   . Anemia   . Peripheral vascular disease, unspecified 11/19/2012    In the past had R foot toe amps then R TMA. In 2015 had left foot toe amp > then TMA >then L BKA on 05/14/13   . History of MRSA infection 04/22/2013    Bacteremia assoc w L foot wound infection Mar 2015   . Gangrene of foot   .  ESRD on hemodialysis   . Diabetes mellitus without complication     Type 2  . Diabetes mellitus with peripheral vascular disease     Medications:   (Not in a hospital admission) Scheduled:  . aspirin EC  81 mg Oral Daily  . [START ON 01/05/2014] calcium acetate  1,334 mg Oral TID WC  . colchicine  0.6 mg Oral Daily  . feeding supplement (RESOURCE BREEZE)  1 Container Oral TID BM  . gabapentin  200 mg Oral QHS  . heparin  5,000 Units Subcutaneous 3 times per day  . insulin aspart  0-9 Units Subcutaneous 6 times per day  . [START ON 01/05/2014] lanthanum  1,000 mg Oral TID WC  . [START ON 01/05/2014] levothyroxine  175 mcg Oral QAC breakfast  . multivitamin  1 tablet Oral QHS  . multivitamin  1 tablet Oral QHS  . sodium chloride  3 mL Intravenous Q12H  . sodium chloride  3 mL Intravenous Q12H   Infusions:  . sodium chloride     Assessment: 68yo male with ESRD on HD MWF presents with R heel wound infection. Pharmacy is consulted to dose vancomycin and zosyn for osteomyelitis. Pt is afebrile, WBC 9.9, sCr 10.98 on HD.  Goal of Therapy:  Vancomycin pre-HD target level 15-25 mcg/mL  Plan:  Vancomycin 1758m IV once  Followed by vancomycin 754mIV qHD Zosyn 2.25g IV  q8h Measure antibiotic drug levels at steady state Follow up culture results, HD schedule, and clinical course  Jack [name]. Diona Foley, PharmD Clinical Pharmacist Pager [phone] 01/04/2014,7:42 PM

## 2014-01-04 NOTE — ED Provider Notes (Signed)
CSN: [id number]     Arrival date & time 01/04/14  1443 History   First MD Initiated Contact with Patient 01/04/14 1510     Chief Complaint  Patient presents with  . Emesis  . Hypotension  . Wound Check     (Consider location/radiation/quality/duration/timing/severity/associated sxs/prior Treatment) Patient is a 68 y.o. male presenting with wound check. The history is provided by the patient.  Wound Check Chronicity: 2 months. The problem occurs constantly. The problem has been gradually worsening. Associated symptoms include vomiting. Pertinent negatives include no abdominal pain, chest pain, chills, diaphoresis, fever, headaches, myalgias, nausea, rash, sore throat, urinary symptoms or weakness. Nothing aggravates the symptoms. He has tried nothing for the symptoms. The treatment provided no relief.    Past Medical History  Diagnosis Date  . Hypertension   . Pneumonia     2012  . Heart murmur   . Glaucoma   . Multiple myeloma, without mention of having achieved remission 03/30/2012    Cytogenetic neg on 03/23/2012.  . Asthma   . Hyperparathyroidism, secondary renal   . Peripheral arterial disease   . End-stage renal disease on hemodialysis     Started HD March 2014.  Cause of ESRD was DM.  Gets HD at [facility] on [place] on MWF schedule.   . Thyroid disease     hyperparathyroidism  . MRSA bacteremia   . Anemia   . Peripheral vascular disease, unspecified 11/19/2012    In the past had R foot toe amps then R TMA. In 2015 had left foot toe amp > then TMA >then L BKA on 05/14/13   . History of MRSA infection 04/22/2013    Bacteremia assoc w L foot wound infection Mar 2015   . Gangrene of foot   . ESRD on hemodialysis   . Diabetes mellitus without complication     Type 2  . Diabetes mellitus with peripheral vascular disease    Past Surgical History  Procedure Laterality Date  . Thyroidectomy    . Cervical disc surgery    . Insertion of dialysis catheter Right 03/19/2012   Procedure: INSERTION OF DIALYSIS CATHETER;  Surgeon: Mal Misty, MD;  Location: [place];  Service: Vascular;  Laterality: Right;  Right Internal Jugular  . Av fistula placement Left 03/25/2012    Procedure: ARTERIOVENOUS (AV) FISTULA CREATION;  Surgeon: Mal Misty, MD;  Location: [place];  Service: Vascular;  Laterality: Left;  . Ligation of competing branches of arteriovenous fistula Left 05/08/2012    Procedure: LIGATION OF COMPETING BRANCHES OF ARTERIOVENOUS FISTULA;  Surgeon: Mal Misty, MD;  Location: [place];  Service: Vascular;  Laterality: Left;  Ultrasound guided  . Cardiac catheterization      approx 30 years ago  . Amputation Right 11/10/2012    Procedure: AMPUTATION FIRST and SECOND TOES Right Foot;  Surgeon: Elam Dutch, MD;  [facility];  Service: Vascular;  Laterality: Right;  . Transmetatarsal amputation Left 12/16/2012    Procedure: TRANSMETATARSAL AMPUTATION AND VAC PLACEMENT;  Surgeon: Elam Dutch, MD;  Location: [place];  Service: Vascular;  Laterality: Left;  . Amputation Left 04/07/2013    Procedure: AMPUTATION DIGIT- LEFT 1ST TOE;  Surgeon: Mal Misty, MD;  Location: [place];  Service: Vascular;  Laterality: Left;  . Tee without cardioversion N/A 04/20/2013    Procedure: TRANSESOPHAGEAL ECHOCARDIOGRAM (TEE);  Surgeon: Josue Hector, MD;  Location: [place];  Service: Cardiovascular;  Laterality: N/A;  . I&d extremity Left  04/22/2013    Procedure: IRRIGATION AND DEBRIDEMENT LEFT FIRST TOE AMPUTATION WOUND ;  Surgeon: Mal Misty, MD;  Location: [place];  Service: Vascular;  Laterality: Left;  . Amputation Left 04/26/2013    Procedure: Left Foot Transmetatarsal Amputation;  Surgeon: Newt Minion, MD;  Location: [place];  Service: Orthopedics;  Laterality: Left;  . Toe amputation      D/C 04-30-13  . Below knee leg amputation Left 05/14/2013    DR [name]  . Amputation Left 05/14/2013    Procedure: AMPUTATION BELOW KNEE;  Surgeon: Newt Minion, MD;  Location: [place];  Service: Orthopedics;  Laterality: Left;  Left Below Knee Amputation  . Eye surgery Bilateral     CATARACTS  . Amputation Left 07/23/2013    Procedure: AMPUTATION BELOW KNEE;  Surgeon: Newt Minion, MD;  Location: [place];  Service: Orthopedics;  Laterality: Left;  Left Below Knee Amputation Revision  . Abdominal aortagram Bilateral 11/06/2012    Procedure: ABDOMINAL AORTAGRAM;  Surgeon: Elam Dutch, MD;  Location: [facility] CATH LAB;  Service: Cardiovascular;  Laterality: Bilateral;   Family History  Problem Relation Age of Onset  . Diabetes Mother   . Cancer Mother     bone   . Kidney disease Father    History  Substance Use Topics  . Smoking status: Former Smoker    Types: Cigarettes    Quit date: 03/19/1982  . Smokeless tobacco: Never Used  . Alcohol Use: No     Comment: formerly    Review of Systems  Constitutional: Negative for fever, chills, diaphoresis, activity change and appetite change.  HENT: Negative for facial swelling, sore throat, tinnitus, trouble swallowing and voice change.   Eyes: Negative for pain, redness and visual disturbance.  Respiratory: Negative for chest tightness, shortness of breath and wheezing.   Cardiovascular: Negative for chest pain, palpitations and leg swelling.  Gastrointestinal: Positive for vomiting. Negative for nausea, abdominal pain, diarrhea, constipation and abdominal distention.  Endocrine: Negative.   Genitourinary: Negative.  Negative for dysuria, decreased urine volume, scrotal swelling and testicular pain.  Musculoskeletal: Negative for myalgias, back pain and gait problem.  Skin: Positive for wound. Negative for rash.  Neurological: Negative.  Negative for dizziness, tremors, weakness and headaches.  Psychiatric/Behavioral: Negative for suicidal ideas, hallucinations and self-injury. The patient is not nervous/anxious.       Allergies  Bee venom; Lisinopril; Ivp dye; and Morphine and related  Home Medications   Prior  to Admission medications   Medication Sig Start Date End Date Taking? Authorizing Provider  aspirin EC 81 MG tablet Take 81 mg by mouth daily.   Yes Historical Provider, MD  b complex-vitamin c-folic acid (NEPHRO-VITE) 0.8 MG TABS tablet Take 1 tablet by mouth daily.   Yes Historical Provider, MD  calcium acetate (PHOSLO) 667 MG capsule Take 1,334 mg by mouth 3 (three) times daily with meals.   Yes Historical Provider, MD  colchicine 0.6 MG tablet Take 0.6 mg by mouth daily.   Yes Historical Provider, MD  feeding supplement, RESOURCE BREEZE, (RESOURCE BREEZE) LIQD Take 1 Container by mouth 3 (three) times daily between meals.   Yes Historical Provider, MD  gabapentin (NEURONTIN) 100 MG capsule Take 2 capsules (200 mg total) by mouth at bedtime. 12/14/13  Yes Meredith Staggers, MD  lanthanum (FOSRENOL) 1000 MG chewable tablet Chew 1,000 mg by mouth 3 (three) times daily with meals.   Yes Historical Provider, MD  levothyroxine (SYNTHROID, LEVOTHROID) 175 MCG  tablet Take 175 mcg by mouth daily before breakfast.   Yes Historical Provider, MD  multivitamin (RENA-VIT) TABS tablet Take 1 tablet by mouth at bedtime.   Yes Historical Provider, MD  Oxycodone HCl 10 MG TABS Take 5-10 mg by mouth every 6 (six) hours as needed (for pain).   Yes Historical Provider, MD  oxyCODONE-acetaminophen (ROXICET) 5-325 MG per tablet Take 1 tablet by mouth every 8 (eight) hours as needed for severe pain. 12/14/13  Yes Meredith Staggers, MD   BP 114/70 mmHg  Pulse 61  Temp(Src) 98.1 F (36.7 C) (Oral)  Resp 18  Ht '5\' 10"'  (1.778 m)  Wt 160 lb (72.576 kg)  BMI 22.96 kg/m2  SpO2 100% Physical Exam  Constitutional: He is oriented to person, place, and time. He appears well-developed and well-nourished. No distress.  HENT:  Head: Normocephalic and atraumatic.  Right Ear: External ear normal.  Left Ear: External ear normal.  Nose: Nose normal.  Mouth/Throat: Oropharynx is clear and moist.  Eyes: Conjunctivae and EOM are  normal. Pupils are equal, round, and reactive to light. No scleral icterus.  Neck: Normal range of motion. Neck supple. No JVD present. No tracheal deviation present. No thyromegaly present.  Cardiovascular: Normal rate and intact distal pulses.  Exam reveals no gallop and no friction rub.   No murmur heard. Pulmonary/Chest: Effort normal and breath sounds normal. No stridor. No respiratory distress. He has no wheezes. He has no rales.  Abdominal: Soft. He exhibits no distension. There is no tenderness. There is no rebound and no guarding.  Musculoskeletal: He exhibits no edema or tenderness.  Left leg BKA incision appears C/D/I with no signs of infection. Right heel ulcer with thin serosanguinous drainage.   Neurological: He is alert and oriented to person, place, and time. No cranial nerve deficit. He exhibits normal muscle tone. Coordination normal.  Skin: Skin is warm and dry. No rash noted. He is not diaphoretic.  Psychiatric: He has a normal mood and affect. His behavior is normal.  Nursing note and vitals reviewed.   ED Course  Procedures (including critical care time) Labs Review Labs Reviewed  CBC WITH DIFFERENTIAL - Abnormal; Notable for the following:    Neutrophils Relative % 80 (*)    Neutro Abs 7.9 (*)    All other components within normal limits  BASIC METABOLIC PANEL - Abnormal; Notable for the following:    Chloride 92 (*)    Glucose, Bld 139 (*)    BUN 48 (*)    Creatinine, Ser 10.98 (*)    GFR calc non Af Amer 4 (*)    GFR calc Af Amer 5 (*)    Anion gap 19 (*)    All other components within normal limits  CBC - Abnormal; Notable for the following:    Hemoglobin 11.7 (*)    HCT 37.5 (*)    All other components within normal limits  WOUND CULTURE  CULTURE, BLOOD (ROUTINE X 2)  CULTURE, BLOOD (ROUTINE X 2)  SEDIMENTATION RATE  C-REACTIVE PROTEIN  CREATININE, SERUM  COMPREHENSIVE METABOLIC PANEL  CBC WITH DIFFERENTIAL  HEMOGLOBIN A1C    Imaging Review No  results found.   EKG Interpretation None      MDM   Final diagnoses:  Heel ulcer, right, with fat layer exposed    The patient is a 68 y.o. Male with complicated medical history including CKD and DM who is sent for wound care center. Concern for osteomyelitis. Cultures and labs sent.  Patient started on vancomycin and admitted to hospitalist with ortho following on. Patient and family updated on plan.  Patient seen with attending, Dr. Regenia Skeeter, who oversaw clinical decision making.     Margaretann Loveless, MD 01/04/14 1946  Ephraim Hamburger, MD 01/13/14 [phone]

## 2014-01-04 NOTE — Therapy (Signed)
[facility] [address] [place], [place], [address] Phone: [phone]   Fax:  [phone]  Physical Therapy Treatment  Patient Details  Name: Jack [name] MRN: [id number] Date of Birth: 11-Apr-1945  Encounter Date: 01/04/2014      PT End of Session - 01/04/14 0800    Visit Number 6   Number of Visits 18   Date for PT Re-Evaluation 02/11/14   PT Start Time 0800   PT Stop Time 0845   PT Time Calculation (min) 45 min   Equipment Utilized During Treatment Gait belt   Activity Tolerance Patient tolerated treatment well   Behavior During Therapy [facility] for tasks assessed/performed      Past Medical History  Diagnosis Date  . Hypertension   . Pneumonia     2012  . Heart murmur   . Glaucoma   . Multiple myeloma, without mention of having achieved remission 03/30/2012    Cytogenetic neg on 03/23/2012.  . Asthma   . Hyperparathyroidism, secondary renal   . Peripheral arterial disease   . End-stage renal disease on hemodialysis     Started HD March 2014.  Cause of ESRD was DM.  Gets HD at [facility] on [place] on MWF schedule.   . Thyroid disease     hyperparathyroidism  . MRSA bacteremia   . Anemia   . Peripheral vascular disease, unspecified 11/19/2012    In the past had R foot toe amps then R TMA. In 2015 had left foot toe amp > then TMA >then L BKA on 05/14/13   . History of MRSA infection 04/22/2013    Bacteremia assoc w L foot wound infection Mar 2015   . Gangrene of foot   . ESRD on hemodialysis   . Diabetes mellitus without complication     Type 2  . Diabetes mellitus with peripheral vascular disease     Past Surgical History  Procedure Laterality Date  . Thyroidectomy    . Cervical disc surgery    . Insertion of dialysis catheter Right 03/19/2012    Procedure: INSERTION OF DIALYSIS CATHETER;  Surgeon: Mal Misty, MD;  Location: [place];  Service: Vascular;  Laterality: Right;  Right Internal  Jugular  . Av fistula placement Left 03/25/2012    Procedure: ARTERIOVENOUS (AV) FISTULA CREATION;  Surgeon: Mal Misty, MD;  Location: [place];  Service: Vascular;  Laterality: Left;  . Ligation of competing branches of arteriovenous fistula Left 05/08/2012    Procedure: LIGATION OF COMPETING BRANCHES OF ARTERIOVENOUS FISTULA;  Surgeon: Mal Misty, MD;  Location: [place];  Service: Vascular;  Laterality: Left;  Ultrasound guided  . Cardiac catheterization      approx 30 years ago  . Amputation Right 11/10/2012    Procedure: AMPUTATION FIRST and SECOND TOES Right Foot;  Surgeon: Elam Dutch, MD;  Location: [facility];  Service: Vascular;  Laterality: Right;  . Transmetatarsal amputation Left 12/16/2012    Procedure: TRANSMETATARSAL AMPUTATION AND VAC PLACEMENT;  Surgeon: Elam Dutch, MD;  Location: [place];  Service: Vascular;  Laterality: Left;  . Amputation Left 04/07/2013    Procedure: AMPUTATION DIGIT- LEFT 1ST TOE;  Surgeon: Mal Misty, MD;  Location: [place];  Service: Vascular;  Laterality: Left;  . Tee without cardioversion N/A 04/20/2013    Procedure: TRANSESOPHAGEAL ECHOCARDIOGRAM (TEE);  Surgeon: Josue Hector, MD;  Location: [place];  Service: Cardiovascular;  Laterality: N/A;  . I&d extremity Left 04/22/2013  Procedure: IRRIGATION AND DEBRIDEMENT LEFT FIRST TOE AMPUTATION WOUND ;  Surgeon: Mal Misty, MD;  Location: [place];  Service: Vascular;  Laterality: Left;  . Amputation Left 04/26/2013    Procedure: Left Foot Transmetatarsal Amputation;  Surgeon: Newt Minion, MD;  Location: [place];  Service: Orthopedics;  Laterality: Left;  . Toe amputation      D/C 04-30-13  . Below knee leg amputation Left 05/14/2013    DR [name]  . Amputation Left 05/14/2013    Procedure: AMPUTATION BELOW KNEE;  Surgeon: Newt Minion, MD;  Location: [place];  Service: Orthopedics;  Laterality: Left;  Left Below Knee Amputation  . Eye surgery Bilateral     CATARACTS  . Amputation Left 07/23/2013     Procedure: AMPUTATION BELOW KNEE;  Surgeon: Newt Minion, MD;  Location: [employer] [facility];  Service: Orthopedics;  Laterality: Left;  Left Below Knee Amputation Revision  . Abdominal aortagram Bilateral 11/06/2012    Procedure: ABDOMINAL AORTAGRAM;  Surgeon: Elam Dutch, MD;  Location: [facility] CATH LAB;  Service: Cardiovascular;  Laterality: Bilateral;    There were no vitals taken for this visit.  Visit Diagnosis:  Abnormality of gait  Activity intolerance  Status post below knee amputation of left lower extremity  Decreased ROM of lower extremity  S/P transmetatarsal amputation of foot, right      Subjective Assessment - 01/04/14 0800    Symptoms reports new brace for right ankle is helping with positioning in bed.   Currently in Pain? Yes   Pain Score 6    Pain Location Foot   Pain Orientation Right   Pain Descriptors / Indicators Aching;Sore   Pain Type Other (Comment)  wound pain   Pain Onset More than a month ago   Pain Frequency Constant   Multiple Pain Sites No                    [facility] Adult PT Treatment/Exercise - 01/04/14 0800    Transfers   Sit to Stand 4: Min guard;With upper extremity assist;With armrests;From chair/3-in-1   Sit to Stand Details (indicate cue type and reason) cues for safety   Stand to Sit 5: Supervision;With upper extremity assist;With armrests;To chair/3-in-1   Stand to Sit Details cues on safety   Squat Pivot Transfers 4: Min assist  with rolling walker   Prosthetics   Prosthetic Care Comments  PT instructed in weighing & wearing /dry wt issues at dialysis   Current prosthetic wear tolerance (days/week)  7 days /wk   Current prosthetic wear tolerance (#hours/day)  3 hours 2x day   Residual limb condition  dry scaly skin, no open areas,    Education Provided Skin check;Residual limb care;Proper wear schedule/adjustment  fall risk & functional limitations when prosthesis off   Person(s) Educated Patient;Other (comment)  brother    Education Method Explanation   Education Method Verbalized understanding   Donning Prosthesis Supervision   Doffing Prosthesis Modified independent (device/increased time)                PT Education - 01/04/14 0800    Education provided Yes   Education Details proper use of right foot resting brace including kick-stand to prevent rotation, PT called prosthetist to request cast shoe or something that can wear AFO/partial foot prosthesis with bulky wound bandage.   Person(s) Educated Patient;Other (comment)  brother   Methods Explanation;Demonstration   Comprehension Verbalized understanding          PT  Short Term Goals - 12/28/13 0800    PT SHORT TERM GOAL #1   Title Ambulate 50 feet with RW, R AFO, L BKA prosthesis with supervision. (Target Date: 01/13/14)   Time 1   Period Months   Status On-going   PT SHORT TERM GOAL #2   Title Pt will negotiate up/down ramp and curb with minimal assistance, RW, R AFO, and L BKA prosthesis  (Target Date: 01/13/14)   Time 1   Period Months   Status On-going   PT SHORT TERM GOAL #3   Title Patient demonstrates & verbalizes proper prosthetic donning of bilateral prostheses modified independent.  (Target Date: 01/13/14)   Time 1   Period Months   Status On-going   PT SHORT TERM GOAL #4   Title Patient will tolerate prosthetic wear time of >8 hours/day  (Target Date: 01/13/14)   Time 1   Period Months   Status On-going   PT SHORT TERM GOAL #5   Title Patient stands /sits, maintains standing balance with walker & reaches >5" with walker without balance loss with supervision.  (Target Date: 01/13/14)   Time 1   Period Months   Status On-going           PT Long Term Goals - 12/28/13 0800    PT LONG TERM GOAL #1   Title Tolerates wear of prostheses >90% of awake hours without change in skin integrity nor undue tenderness. (Target Date: 02/11/14)   Time 2   Period Months   Status On-going   PT LONG TERM GOAL #2   Title  Patient will ambulate 270f with LRAD, R AFO, L BKA prosthesis modified independent (Target Date: 02/11/14)   Time 2   Period Months   Status On-going   PT LONG TERM GOAL #3   Title Patient verbalizes & demonstrates proper prosthetic care. (Target Date: 02/11/14)   Time 2   Period Months   Status On-going   PT LONG TERM GOAL #4   Title Patient will negotiate up/down ramp and curb with LRAD, R AFO, L BKA prosthesis modified independent (Target Date: 02/11/14)   Time 2   Period Months   Status On-going   PT LONG TERM GOAL #5   Title Berg Balance >36/56 (Target Date: 02/11/14)   Time 2   Period Months   Status On-going               Plan - 01/04/14 0800    Clinical Impression Statement Patient seems to understand benefits of increasing wear of prosthesis to most of awake hours. He also seems to understand weighing / wearing of prosthesis to dialysis.   Pt will benefit from skilled therapeutic intervention in order to improve on the following deficits Abnormal gait;Impaired flexibility;Decreased mobility;Decreased activity tolerance;Decreased endurance;Decreased range of motion;Decreased strength;Decreased balance;Difficulty walking;Decreased knowledge of use of DME  prosthetic dependency   Rehab Potential Good   PT Frequency 2x / week   PT Duration 8 weeks   PT Treatment/Interventions ADLs/Self Care Home Management;Gait training;Therapeutic exercise;Patient/family education;Energy conservation;Balance training;Stair training;Functional mobility training;Neuromuscular re-education;Manual techniques;Other (comment)  prosthetic training   PT Next Visit Plan Progress standing tolerance and posture, prosthetic weight bearing. Work on knee extension bil legs (stretching).   PT Home Exercise Plan HS stretches in long sitting; quad sets in long sitting   Consulted and Agree with Plan of Care Patient;Family member/caregiver   Family Member Consulted Brother        Problem List Patient  Active Problem List  Diagnosis Date Noted  . Phantom limb 12/14/2013  . Dehiscence of amputation stump 07/23/2013  . Type 1 diabetes mellitus with diabetic foot infection   . Diabetes mellitus with peripheral vascular disease   . ESRD on hemodialysis   . BKA stump complication 68/11/5724  . Asthma 05/17/2013  . S/P BKA (below knee amputation) unilateral 05/17/2013  . FTT (failure to thrive) in adult 05/01/2013  . History of MRSA infection 04/22/2013  . Peripheral vascular disease, unspecified 11/19/2012  . End-stage renal disease on hemodialysis 05/05/2012  . End stage kidney disease 05/05/2012  . [place] disease 03/30/2012  . Anemia in chronic kidney disease 03/19/2012  . Iron deficiency 03/19/2012  . Hypothyroidism 03/19/2012  . Anemia, iron deficiency 03/19/2012  . Hypertension 03/18/2012  . Chronic kidney disease (CKD), stage V 03/18/2012  . Benign hypertension 03/18/2012  . Cardiac conduction disorder 03/18/2012  . Insulin dependent type 1 diabetes mellitus 03/18/2012  . MGUS (monoclonal gammopathy of unknown significance) 02/28/2011  . Monoclonal paraproteinemia 02/28/2011    Jamey Reas PT, DPT 01/04/2014, 1:15 PM  [facility] [address] [place], [place], [address] Phone: [phone]   Fax:  [phone]    Jamey Reas, PT, DPT PT Specializing in [facility] 01/04/2014 1:16 PM Phone:  [phone]  Fax:  [phone] [facility] [address] [place], [place] [address]

## 2014-01-04 NOTE — ED Notes (Signed)
Attempted to give report 

## 2014-01-04 NOTE — H&P (Signed)
Hospitalist Admission History and Physical  Patient name: Jack [name] Medical record number: [id number] Date of birth: [birth year] Age: 68 y.o. Gender: male  Primary Care Provider: [name], MD  Chief Complaint: R heel osteomyelitis  History of Present Illness:This is a 68 y.o. year old male with significant past medical history of ESRD on HD MWF, PVD s/p L BKA, R foot partial amputation, type 2 DM, asthma, multiple myeloma presenting with osteomyelitis. Pt has been followed at wound care for the past 2 months for R heel ulcer. Has been going to [facility] regularly. There was some concern for R heel osteo approx 2 weeks ago. Pt was sent for R heel MRI on 12/11. Changes on xray concerning for cellulitis and early osteo. Pt states that he was not referred to orthopedics at the time. States that he has had 3 days of progressive nausea and malaise. Emesis NBNB. No abd pain. No fevers. 1-2 episodes of mild diarrhea. States that he missed HD on Monday because he had to go to court.  Presents to ER T 98.1, HR 60s, resp 10s, BP 70s-130s. Satting 100% on RA. WBC 9.9, hgb 13.5, Cr 10.98, bicarb 27. Glu 139. R foot xray and ESR pending. I have asked ER resident Rhina Brackett to contacts pt's orthopedist ([name]).     Assessment and Plan: Jack [name] is a 68 y.o. year old male presenting with Osteomyelitis  Active Problems:   Osteomyelitis   1- Osteomyelitis  -IV vanc and zosyn given diabetic and renal status  -blood cultures drawn.  -f/u ESR and xray  -may need repeat MRI ( avoid contrast in setting of ESRD-nephrogenic systemic sclerosis) -pending formal ortho consult  -follow   2- ESRD  -Due for HD -fairly euvolemic -renal consult in am   3- DM  -SSI -A1C    FEN/GI: renal/diabetic diet. NPO PMN  Prophylaxis: sub q heparin Disposition: pending further evaluation  Code Status:Full Code    Patient Active Problem List   Diagnosis Date Noted  . Osteomyelitis 01/04/2014  .  Phantom limb 12/14/2013  . Dehiscence of amputation stump 07/23/2013  . Type 1 diabetes mellitus with diabetic foot infection   . Diabetes mellitus with peripheral vascular disease   . ESRD on hemodialysis   . BKA stump complication 24/40/1027  . Asthma 05/17/2013  . S/P BKA (below knee amputation) unilateral 05/17/2013  . FTT (failure to thrive) in adult 05/01/2013  . History of MRSA infection 04/22/2013  . Peripheral vascular disease, unspecified 11/19/2012  . End-stage renal disease on hemodialysis 05/05/2012  . End stage kidney disease 05/05/2012  . Kahler disease 03/30/2012  . Anemia in chronic kidney disease 03/19/2012  . Iron deficiency 03/19/2012  . Hypothyroidism 03/19/2012  . Anemia, iron deficiency 03/19/2012  . Hypertension 03/18/2012  . Chronic kidney disease (CKD), stage V 03/18/2012  . Benign hypertension 03/18/2012  . Cardiac conduction disorder 03/18/2012  . Insulin dependent type 1 diabetes mellitus 03/18/2012  . MGUS (monoclonal gammopathy of unknown significance) 02/28/2011  . Monoclonal paraproteinemia 02/28/2011   Past Medical History: Past Medical History  Diagnosis Date  . Hypertension   . Pneumonia     2012  . Heart murmur   . Glaucoma   . Multiple myeloma, without mention of having achieved remission 03/30/2012    Cytogenetic neg on 03/23/2012.  . Asthma   . Hyperparathyroidism, secondary renal   . Peripheral arterial disease   . End-stage renal disease on hemodialysis     Started HD March  2014.  Cause of ESRD was DM.  Gets HD at [facility] on [address] on MWF schedule.   . Thyroid disease     hyperparathyroidism  . MRSA bacteremia   . Anemia   . Peripheral vascular disease, unspecified 11/19/2012    In the past had R foot toe amps then R TMA. In 2015 had left foot toe amp > then TMA >then L BKA on 05/14/13   . History of MRSA infection 04/22/2013    Bacteremia assoc w L foot wound infection Mar 2015   . Gangrene of foot   . ESRD on hemodialysis    . Diabetes mellitus without complication     Type 2  . Diabetes mellitus with peripheral vascular disease     Past Surgical History: Past Surgical History  Procedure Laterality Date  . Thyroidectomy    . Cervical disc surgery    . Insertion of dialysis catheter Right 03/19/2012    Procedure: INSERTION OF DIALYSIS CATHETER;  Surgeon: Mal Misty, MD;  Location: [place];  Service: Vascular;  Laterality: Right;  Right Internal Jugular  . Av fistula placement Left 03/25/2012    Procedure: ARTERIOVENOUS (AV) FISTULA CREATION;  Surgeon: Mal Misty, MD;  Location: [place];  Service: Vascular;  Laterality: Left;  . Ligation of competing branches of arteriovenous fistula Left 05/08/2012    Procedure: LIGATION OF COMPETING BRANCHES OF ARTERIOVENOUS FISTULA;  Surgeon: Mal Misty, MD;  Location: [place];  Service: Vascular;  Laterality: Left;  Ultrasound guided  . Cardiac catheterization      approx 30 years ago  . Amputation Right 11/10/2012    Procedure: AMPUTATION FIRST and SECOND TOES Right Foot;  Surgeon: Elam Dutch, MD;  Location: [facility] OR;  Service: Vascular;  Laterality: Right;  . Transmetatarsal amputation Left 12/16/2012    Procedure: TRANSMETATARSAL AMPUTATION AND VAC PLACEMENT;  Surgeon: Elam Dutch, MD;  Location: [place];  Service: Vascular;  Laterality: Left;  . Amputation Left 04/07/2013    Procedure: AMPUTATION DIGIT- LEFT 1ST TOE;  Surgeon: Mal Misty, MD;  Location: [place];  Service: Vascular;  Laterality: Left;  . Tee without cardioversion N/A 04/20/2013    Procedure: TRANSESOPHAGEAL ECHOCARDIOGRAM (TEE);  Surgeon: Josue Hector, MD;  Location: [facility] ENDOSCOPY;  Service: Cardiovascular;  Laterality: N/A;  . I&d extremity Left 04/22/2013    Procedure: [id number] HVF DEBRIDEMENT LEFT FIRST TOE AMPUTATION WOUND ;  Surgeon: Mal Misty, MD;  Location: [place];  Service: Vascular;  Laterality: Left;  . Amputation Left 04/26/2013    Procedure: Left Foot Transmetatarsal Amputation;   Surgeon: Newt Minion, MD;  Location: [place];  Service: Orthopedics;  Laterality: Left;  . Toe amputation      D/C 04-30-13  . Below knee leg amputation Left 05/14/2013    DR [name]  . Amputation Left 05/14/2013    Procedure: AMPUTATION BELOW KNEE;  Surgeon: Newt Minion, MD;  Location: [place];  Service: Orthopedics;  Laterality: Left;  Left Below Knee Amputation  . Eye surgery Bilateral     CATARACTS  . Amputation Left 07/23/2013    Procedure: AMPUTATION BELOW KNEE;  Surgeon: Newt Minion, MD;  Location: [place];  Service: Orthopedics;  Laterality: Left;  Left Below Knee Amputation Revision  . Abdominal aortagram Bilateral 11/06/2012    Procedure: ABDOMINAL AORTAGRAM;  Surgeon: Elam Dutch, MD;  Location: [facility] CATH LAB;  Service: Cardiovascular;  Laterality: Bilateral;    Social History: History  Social History  . Marital Status: Divorced    Spouse Name: N/A    Number of Children: N/A  . Years of Education: N/A   Social History Main Topics  . Smoking status: Former Smoker    Types: Cigarettes    Quit date: 03/19/1982  . Smokeless tobacco: Never Used  . Alcohol Use: No     Comment: formerly  . Drug Use: No  . Sexual Activity: No   Other Topics Concern  . None   Social History Narrative    Family History: Family History  Problem Relation Age of Onset  . Diabetes Mother   . Cancer Mother     bone   . Kidney disease Father     Allergies: Allergies  Allergen Reactions  . Bee Venom Anaphylaxis  . Lisinopril Cough  . Ivp Dye [Iodinated Diagnostic Agents] Hives  . Morphine And Related Other (See Comments)    Bradycardia states patient    Current Facility-Administered Medications  Medication Dose Route Frequency Provider Last Rate Last Dose  . 0.9 %  sodium chloride infusion  250 mL Intravenous PRN Shanda Howells, MD      . heparin injection 5,000 Units  5,000 Units Subcutaneous 3 times per day Shanda Howells, MD      . sodium chloride 0.9 % injection 3 mL  3 mL  Intravenous Q12H Shanda Howells, MD      . sodium chloride 0.9 % injection 3 mL  3 mL Intravenous Q12H Shanda Howells, MD      . sodium chloride 0.9 % injection 3 mL  3 mL Intravenous PRN Shanda Howells, MD       Current Outpatient Prescriptions  Medication Sig Dispense Refill  . aspirin EC 81 MG tablet Take 81 mg by mouth daily.    Marland Kitchen b complex-vitamin c-folic acid (NEPHRO-VITE) 0.8 MG TABS tablet Take 1 tablet by mouth daily.    . calcium acetate (PHOSLO) 667 MG capsule Take 1,334 mg by mouth 3 (three) times daily with meals.    . colchicine 0.6 MG tablet Take 0.6 mg by mouth daily.    . feeding supplement, RESOURCE BREEZE, (RESOURCE BREEZE) LIQD Take 1 Container by mouth 3 (three) times daily between meals.    . gabapentin (NEURONTIN) 100 MG capsule Take 2 capsules (200 mg total) by mouth at bedtime. 60 capsule 3  . lanthanum (FOSRENOL) 1000 MG chewable tablet Chew 1,000 mg by mouth 3 (three) times daily with meals.    Marland Kitchen levothyroxine (SYNTHROID, LEVOTHROID) 175 MCG tablet Take 175 mcg by mouth daily before breakfast.    . multivitamin (RENA-VIT) TABS tablet Take 1 tablet by mouth at bedtime.    . Oxycodone HCl 10 MG TABS Take 5-10 mg by mouth every 6 (six) hours as needed (for pain).    Marland Kitchen oxyCODONE-acetaminophen (ROXICET) 5-325 MG per tablet Take 1 tablet by mouth every 8 (eight) hours as needed for severe pain. 60 tablet 0   Review Of Systems: 12 point ROS negative except as noted above in HPI.  Physical Exam: Filed Vitals:   01/04/14 1900  BP: 129/64  Pulse: 64  Temp:   Resp: 13    General: alert and cooperative HEENT: PERRLA and extra ocular movement intact Heart: S1, S2 normal, no murmur, rub or gallop, regular rate and rhythm Lungs: clear to auscultation, no wheezes or rales and unlabored breathing Abdomen: abdomen is soft without significant tenderness, masses, organomegaly or guarding Extremities: L BKA, L partial amputation, R heel w/ open  ulceration, foul smeling, mild  active drainage  Skin:as above  Neurology: peripheral neuropathy, otherwise grossly normal   Labs and Imaging: Lab Results  Component Value Date/Time   NA 138 01/04/2014 04:30 PM   NA 139 09/29/2012 09:16 AM   K 4.5 01/04/2014 04:30 PM   K 4.0 09/29/2012 09:16 AM   CL 92* 01/04/2014 04:30 PM   CL 97* 06/10/2012 10:50 AM   CO2 27 01/04/2014 04:30 PM   CO2 31* 09/29/2012 09:16 AM   BUN 48* 01/04/2014 04:30 PM   BUN 29.0* 09/29/2012 09:16 AM   CREATININE 10.98* 01/04/2014 04:30 PM   CREATININE 7.3* 09/29/2012 09:16 AM   GLUCOSE 139* 01/04/2014 04:30 PM   GLUCOSE 265* 09/29/2012 09:16 AM   GLUCOSE 244* 06/10/2012 10:50 AM   Lab Results  Component Value Date   WBC 9.9 01/04/2014   HGB 13.5 01/04/2014   HCT 41.8 01/04/2014   MCV 86.4 01/04/2014   PLT 290 01/04/2014    No results found.         Shanda Howells MD  Pager: [phone]

## 2014-01-04 NOTE — ED Notes (Signed)
Pt using bedpan. 

## 2014-01-04 NOTE — ED Notes (Signed)
Pt. Stated, I sent here from Dr. Lindon Romp from the wound care center. Ive been throwing up for 2 days and Ive got low BP.  I came from the wound center and yall need to look at that.

## 2014-01-05 ENCOUNTER — Inpatient Hospital Stay (HOSPITAL_COMMUNITY): Payer: Medicare Other | Admitting: Certified Registered"

## 2014-01-05 ENCOUNTER — Encounter (HOSPITAL_COMMUNITY)
Admission: EM | Disposition: A | Discharge status: Home / Self Care | Payer: Self-pay | Source: Home / Self Care | Attending: Internal Medicine

## 2014-01-05 ENCOUNTER — Ambulatory Visit: Payer: Medicare Other | Admitting: Infectious Disease

## 2014-01-05 ENCOUNTER — Encounter (HOSPITAL_COMMUNITY): Payer: Self-pay | Admitting: Certified Registered Nurse Anesthetist

## 2014-01-05 ENCOUNTER — Other Ambulatory Visit: Payer: Self-pay | Admitting: *Deleted

## 2014-01-05 DIAGNOSIS — M869 Osteomyelitis, unspecified: Secondary | ICD-10-CM

## 2014-01-05 DIAGNOSIS — I739 Peripheral vascular disease, unspecified: Secondary | ICD-10-CM

## 2014-01-05 DIAGNOSIS — L97409 Non-pressure chronic ulcer of unspecified heel and midfoot with unspecified severity: Secondary | ICD-10-CM

## 2014-01-05 DIAGNOSIS — E10628 Type 1 diabetes mellitus with other skin complications: Secondary | ICD-10-CM

## 2014-01-05 DIAGNOSIS — Z89512 Acquired absence of left leg below knee: Secondary | ICD-10-CM

## 2014-01-05 DIAGNOSIS — L089 Local infection of the skin and subcutaneous tissue, unspecified: Secondary | ICD-10-CM

## 2014-01-05 DIAGNOSIS — E039 Hypothyroidism, unspecified: Secondary | ICD-10-CM

## 2014-01-05 HISTORY — PX: I&D EXTREMITY: SHX5045

## 2014-01-05 LAB — CBC WITH DIFFERENTIAL/PLATELET
BASOS PCT: 0 % (ref 0–1)
Basophils Absolute: 0 10*3/uL (ref 0.0–0.1)
EOS ABS: 0.2 10*3/uL (ref 0.0–0.7)
EOS PCT: 3 % (ref 0–5)
HEMATOCRIT: 34.3 % — AB (ref 39.0–52.0)
HEMOGLOBIN: 10.6 g/dL — AB (ref 13.0–17.0)
Lymphocytes Relative: 22 % (ref 12–46)
Lymphs Abs: 1.4 10*3/uL (ref 0.7–4.0)
MCH: 26.2 pg (ref 26.0–34.0)
MCHC: 30.9 g/dL (ref 30.0–36.0)
MCV: 84.7 fL (ref 78.0–100.0)
MONOS PCT: 12 % (ref 3–12)
Monocytes Absolute: 0.8 10*3/uL (ref 0.1–1.0)
Neutro Abs: 4.2 10*3/uL (ref 1.7–7.7)
Neutrophils Relative %: 63 % (ref 43–77)
Platelets: 253 10*3/uL (ref 150–400)
RBC: 4.05 MIL/uL — ABNORMAL LOW (ref 4.22–5.81)
RDW: 14.1 % (ref 11.5–15.5)
WBC: 6.6 10*3/uL (ref 4.0–10.5)

## 2014-01-05 LAB — COMPREHENSIVE METABOLIC PANEL
ALBUMIN: 2.7 g/dL — AB (ref 3.5–5.2)
ALK PHOS: 71 U/L (ref 39–117)
ALT: 12 U/L (ref 0–53)
AST: 20 U/L (ref 0–37)
Anion gap: 16 — ABNORMAL HIGH (ref 5–15)
BILIRUBIN TOTAL: 0.9 mg/dL (ref 0.3–1.2)
BUN: 56 mg/dL — ABNORMAL HIGH (ref 6–23)
CHLORIDE: 91 meq/L — AB (ref 96–112)
CO2: 27 mmol/L (ref 19–32)
Calcium: 8.7 mg/dL (ref 8.4–10.5)
Creatinine, Ser: 11.69 mg/dL — ABNORMAL HIGH (ref 0.50–1.35)
GFR calc Af Amer: 4 mL/min — ABNORMAL LOW (ref >90)
GFR calc non Af Amer: 4 mL/min — ABNORMAL LOW (ref >90)
Glucose, Bld: 78 mg/dL (ref 70–99)
POTASSIUM: 5.1 mmol/L (ref 3.5–5.1)
SODIUM: 134 mmol/L — AB (ref 135–145)
TOTAL PROTEIN: 6.1 g/dL (ref 6.0–8.3)

## 2014-01-05 LAB — GLUCOSE, CAPILLARY
GLUCOSE-CAPILLARY: 89 mg/dL (ref 70–99)
GLUCOSE-CAPILLARY: 96 mg/dL (ref 70–99)
Glucose-Capillary: 102 mg/dL — ABNORMAL HIGH (ref 70–99)
Glucose-Capillary: 169 mg/dL — ABNORMAL HIGH (ref 70–99)
Glucose-Capillary: 84 mg/dL (ref 70–99)
Glucose-Capillary: 92 mg/dL (ref 70–99)

## 2014-01-05 LAB — C-REACTIVE PROTEIN: CRP: 8.1 mg/dL — AB (ref <0.60)

## 2014-01-05 LAB — HEMOGLOBIN A1C
Hgb A1c MFr Bld: 7.5 % — ABNORMAL HIGH (ref <5.7)
Mean Plasma Glucose: 169 mg/dL — ABNORMAL HIGH (ref <117)

## 2014-01-05 LAB — MRSA PCR SCREENING: MRSA BY PCR: NEGATIVE

## 2014-01-05 SURGERY — IRRIGATION AND DEBRIDEMENT EXTREMITY
Anesthesia: General | Laterality: Right

## 2014-01-05 MED ORDER — MIDAZOLAM HCL 5 MG/5ML IJ SOLN
INTRAMUSCULAR | Status: DC | PRN
  Administered 2014-01-05: 2 mg via INTRAVENOUS

## 2014-01-05 MED ORDER — EPHEDRINE SULFATE 50 MG/ML IJ SOLN
INTRAMUSCULAR | Status: DC | PRN
  Administered 2014-01-05: 5 mg via INTRAVENOUS

## 2014-01-05 MED ORDER — PHENYLEPHRINE 40 MCG/ML (10ML) SYRINGE FOR IV PUSH (FOR BLOOD PRESSURE SUPPORT)
PREFILLED_SYRINGE | INTRAVENOUS | Status: AC
  Filled 2014-01-05: qty 10

## 2014-01-05 MED ORDER — MIDAZOLAM HCL 2 MG/2ML IJ SOLN
INTRAMUSCULAR | Status: AC
  Filled 2014-01-05: qty 2

## 2014-01-05 MED ORDER — ONDANSETRON HCL 4 MG/2ML IJ SOLN
INTRAMUSCULAR | Status: AC
  Filled 2014-01-05: qty 2

## 2014-01-05 MED ORDER — ALTEPLASE 2 MG IJ SOLR: 2.0000 mg | Freq: Once | INTRAMUSCULAR | Status: DC | PRN

## 2014-01-05 MED ORDER — PHENYLEPHRINE HCL 10 MG/ML IJ SOLN
INTRAMUSCULAR | Status: DC | PRN
  Administered 2014-01-05 (×4): 80 ug via INTRAVENOUS

## 2014-01-05 MED ORDER — NEPRO/CARBSTEADY PO LIQD: 237.0000 mL | ORAL | Status: DC | PRN

## 2014-01-05 MED ORDER — ROCURONIUM BROMIDE 50 MG/5ML IV SOLN
INTRAVENOUS | Status: AC
  Filled 2014-01-05: qty 1

## 2014-01-05 MED ORDER — EPHEDRINE SULFATE 50 MG/ML IJ SOLN
INTRAMUSCULAR | Status: AC
  Filled 2014-01-05: qty 1

## 2014-01-05 MED ORDER — PROPOFOL 10 MG/ML IV BOLUS
INTRAVENOUS | Status: AC
  Filled 2014-01-05: qty 20

## 2014-01-05 MED ORDER — SODIUM CHLORIDE 0.9 % IR SOLN
Status: DC | PRN
  Administered 2014-01-05: 1000 mL

## 2014-01-05 MED ORDER — HEPARIN SODIUM (PORCINE) 1000 UNIT/ML DIALYSIS: 1000.0000 [IU] | INTRAMUSCULAR | Status: DC | PRN

## 2014-01-05 MED ORDER — FENTANYL CITRATE 0.05 MG/ML IJ SOLN
INTRAMUSCULAR | Status: AC
  Filled 2014-01-05: qty 5

## 2014-01-05 MED ORDER — FENTANYL CITRATE 0.05 MG/ML IJ SOLN
25.0000 ug | INTRAMUSCULAR | Status: DC | PRN
  Administered 2014-01-05 (×2): 25 ug via INTRAVENOUS

## 2014-01-05 MED ORDER — PROPOFOL 10 MG/ML IV BOLUS
INTRAVENOUS | Status: DC | PRN
  Administered 2014-01-05: 100 mg via INTRAVENOUS

## 2014-01-05 MED ORDER — DARBEPOETIN ALFA 25 MCG/0.42ML IJ SOSY: 25.0000 ug | PREFILLED_SYRINGE | INTRAMUSCULAR | Status: DC

## 2014-01-05 MED ORDER — SODIUM CHLORIDE 0.9 % IJ SOLN
INTRAMUSCULAR | Status: AC
  Filled 2014-01-05: qty 10

## 2014-01-05 MED ORDER — HEPARIN SODIUM (PORCINE) 1000 UNIT/ML DIALYSIS: 20.0000 [IU]/kg | INTRAMUSCULAR | Status: DC | PRN

## 2014-01-05 MED ORDER — SODIUM CHLORIDE 0.9 % IV SOLN: 100.0000 mL | INTRAVENOUS | Status: DC | PRN

## 2014-01-05 MED ORDER — SODIUM CHLORIDE 0.9 % IV SOLN
INTRAVENOUS | Status: DC
  Administered 2014-01-05: 18:00:00 via INTRAVENOUS

## 2014-01-05 MED ORDER — LIDOCAINE HCL (PF) 1 % IJ SOLN: 5.0000 mL | INTRAMUSCULAR | Status: DC | PRN

## 2014-01-05 MED ORDER — LIDOCAINE HCL (CARDIAC) 20 MG/ML IV SOLN
INTRAVENOUS | Status: AC
  Filled 2014-01-05: qty 5

## 2014-01-05 MED ORDER — SODIUM CHLORIDE 0.9 % IR SOLN
Status: DC | PRN
  Administered 2014-01-05: 2000 mL

## 2014-01-05 MED ORDER — PENTAFLUOROPROP-TETRAFLUOROETH EX AERO: 1.0000 "application " | INHALATION_SPRAY | CUTANEOUS | Status: DC | PRN

## 2014-01-05 MED ORDER — FENTANYL CITRATE 0.05 MG/ML IJ SOLN
INTRAMUSCULAR | Status: AC
  Filled 2014-01-05: qty 2

## 2014-01-05 MED ORDER — ONDANSETRON HCL 4 MG/2ML IJ SOLN: 4.0000 mg | Freq: Once | INTRAMUSCULAR | Status: AC | PRN

## 2014-01-05 MED ORDER — LIDOCAINE-PRILOCAINE 2.5-2.5 % EX CREA: 1.0000 "application " | TOPICAL_CREAM | CUTANEOUS | Status: DC | PRN

## 2014-01-05 MED ORDER — FENTANYL CITRATE 0.05 MG/ML IJ SOLN
INTRAMUSCULAR | Status: DC | PRN
  Administered 2014-01-05: 50 ug via INTRAVENOUS

## 2014-01-05 MED ORDER — PHENYLEPHRINE HCL 10 MG/ML IJ SOLN
10.0000 mg | INTRAVENOUS | Status: DC | PRN
  Administered 2014-01-05: 5 ug/min via INTRAVENOUS

## 2014-01-05 MED ORDER — SUCCINYLCHOLINE CHLORIDE 20 MG/ML IJ SOLN
INTRAMUSCULAR | Status: AC
  Filled 2014-01-05: qty 1

## 2014-01-05 MED ORDER — ALBUMIN HUMAN 5 % IV SOLN
INTRAVENOUS | Status: DC | PRN
  Administered 2014-01-05: 19:00:00 via INTRAVENOUS

## 2014-01-05 SURGICAL SUPPLY — 62 items
BANDAGE ELASTIC 3 VELCRO ST LF (GAUZE/BANDAGES/DRESSINGS) IMPLANT
BLADE SURG 10 STRL SS (BLADE) ×3 IMPLANT
BNDG COHESIVE 1X5 TAN STRL LF (GAUZE/BANDAGES/DRESSINGS) IMPLANT
BNDG COHESIVE 4X5 TAN STRL (GAUZE/BANDAGES/DRESSINGS) ×3 IMPLANT
BNDG COHESIVE 6X5 TAN STRL LF (GAUZE/BANDAGES/DRESSINGS) ×6 IMPLANT
BNDG CONFORM 3 STRL LF (GAUZE/BANDAGES/DRESSINGS) IMPLANT
BNDG GAUZE ELAST 4 BULKY (GAUZE/BANDAGES/DRESSINGS) ×3 IMPLANT
BNDG GAUZE STRTCH 6 (GAUZE/BANDAGES/DRESSINGS) ×9 IMPLANT
CORDS BIPOLAR (ELECTRODE) IMPLANT
COVER SURGICAL LIGHT HANDLE (MISCELLANEOUS) ×3 IMPLANT
CUFF TOURNIQUET SINGLE 18IN (TOURNIQUET CUFF) ×3 IMPLANT
CUFF TOURNIQUET SINGLE 24IN (TOURNIQUET CUFF) IMPLANT
CUFF TOURNIQUET SINGLE 34IN LL (TOURNIQUET CUFF) IMPLANT
CUFF TOURNIQUET SINGLE 44IN (TOURNIQUET CUFF) IMPLANT
DRAPE ORTHO SPLIT 77X108 STRL (DRAPES) ×4
DRAPE SURG 17X23 STRL (DRAPES) IMPLANT
DRAPE SURG ORHT 6 SPLT 77X108 (DRAPES) ×2 IMPLANT
DRAPE U-SHAPE 47X51 STRL (DRAPES) ×3 IMPLANT
DRSG PAD ABDOMINAL 8X10 ST (GAUZE/BANDAGES/DRESSINGS) ×3 IMPLANT
DURAPREP 26ML APPLICATOR (WOUND CARE) ×3 IMPLANT
ELECT CAUTERY BLADE 6.4 (BLADE) IMPLANT
ELECT REM PT RETURN 9FT ADLT (ELECTROSURGICAL)
ELECTRODE REM PT RTRN 9FT ADLT (ELECTROSURGICAL) IMPLANT
GAUZE SPONGE 4X4 12PLY STRL (GAUZE/BANDAGES/DRESSINGS) ×3 IMPLANT
GAUZE XEROFORM 1X8 LF (GAUZE/BANDAGES/DRESSINGS) ×3 IMPLANT
GLOVE BIO SURGEON STRL SZ8 (GLOVE) ×3 IMPLANT
GLOVE BIOGEL PI IND STRL 8 (GLOVE) ×2 IMPLANT
GLOVE BIOGEL PI INDICATOR 8 (GLOVE) ×4
GLOVE BIOGEL PI ORTHO PRO SZ7 (GLOVE) ×2
GLOVE ORTHO TXT STRL SZ7.5 (GLOVE) ×3 IMPLANT
GLOVE PI ORTHO PRO STRL SZ7 (GLOVE) ×1 IMPLANT
GLOVE SURG SS PI 7.0 STRL IVOR (GLOVE) ×6 IMPLANT
GOWN STRL REUS W/ TWL LRG LVL3 (GOWN DISPOSABLE) ×1 IMPLANT
GOWN STRL REUS W/ TWL XL LVL3 (GOWN DISPOSABLE) ×2 IMPLANT
GOWN STRL REUS W/TWL LRG LVL3 (GOWN DISPOSABLE) ×2
GOWN STRL REUS W/TWL XL LVL3 (GOWN DISPOSABLE) ×4
HANDPIECE INTERPULSE COAX TIP (DISPOSABLE) ×2
KIT BASIN OR (CUSTOM PROCEDURE TRAY) ×3 IMPLANT
KIT ROOM TURNOVER OR (KITS) ×3 IMPLANT
MANIFOLD NEPTUNE II (INSTRUMENTS) ×3 IMPLANT
NS IRRIG 1000ML POUR BTL (IV SOLUTION) ×3 IMPLANT
PACK ORTHO EXTREMITY (CUSTOM PROCEDURE TRAY) ×3 IMPLANT
PAD ARMBOARD 7.5X6 YLW CONV (MISCELLANEOUS) ×6 IMPLANT
PADDING CAST ABS 4INX4YD NS (CAST SUPPLIES) ×4
PADDING CAST ABS COTTON 4X4 ST (CAST SUPPLIES) ×2 IMPLANT
PADDING CAST COTTON 6X4 STRL (CAST SUPPLIES) ×3 IMPLANT
SET HNDPC FAN SPRY TIP SCT (DISPOSABLE) ×1 IMPLANT
SPONGE LAP 18X18 X RAY DECT (DISPOSABLE) ×3 IMPLANT
STOCKINETTE IMPERVIOUS 9X36 MD (GAUZE/BANDAGES/DRESSINGS) ×3 IMPLANT
SUT ETHILON 2 0 FS 18 (SUTURE) ×9 IMPLANT
SUT ETHILON 2 0 PSLX (SUTURE) ×9 IMPLANT
SUT ETHILON 3 0 PS 1 (SUTURE) ×6 IMPLANT
SYR CONTROL 10ML LL (SYRINGE) IMPLANT
TOWEL OR 17X24 6PK STRL BLUE (TOWEL DISPOSABLE) ×3 IMPLANT
TOWEL OR 17X26 10 PK STRL BLUE (TOWEL DISPOSABLE) ×3 IMPLANT
TUBE ANAEROBIC SPECIMEN COL (MISCELLANEOUS) IMPLANT
TUBE CONNECTING 12'X1/4 (SUCTIONS) ×1
TUBE CONNECTING 12X1/4 (SUCTIONS) ×2 IMPLANT
TUBE FEEDING 5FR 15 INCH (TUBING) IMPLANT
UNDERPAD 30X30 INCONTINENT (UNDERPADS AND DIAPERS) ×3 IMPLANT
WATER STERILE IRR 1000ML POUR (IV SOLUTION) ×3 IMPLANT
YANKAUER SUCT BULB TIP NO VENT (SUCTIONS) ×3 IMPLANT

## 2014-01-05 NOTE — Progress Notes (Addendum)
Pt returned from the OR. Pt is alert and oriented x 4. Denies complaints of pain. R foot elevated on pillows per MD order. Coban dressing clean dry and intact. Will continue monitoring. Dorthey Sawyer, RN

## 2014-01-05 NOTE — Brief Op Note (Signed)
01/04/2014 - 01/05/2014  7:20 PM  PATIENT:  Jack [name]  68 y.o. male  PRE-OPERATIVE DIAGNOSIS:  Right foot Osteomyelitis  POST-OPERATIVE DIAGNOSIS:  Right foot Osteomyelitis  PROCEDURE:  Procedure(s) with comments: IRRIGATION AND DEBRIDEMENT Right Heel Ulcer (Right) - Surgeon Available after 5PM  SURGEON:  Surgeon(s) and Role:    * Mcarthur Rossetti, MD - Primary  ANESTHESIA:   general  EBL:  Total I/O In: 550 [I.V.:300; IV Piggyback:250] Out: -   BLOOD ADMINISTERED:none  DRAINS: none   LOCAL MEDICATIONS USED:  NONE  SPECIMEN:  No Specimen  DISPOSITION OF SPECIMEN:  N/A  COUNTS:  YES  TOURNIQUET:  * No tourniquets in log *  DICTATION: .Other Dictation: Dictation Number [id number]  PLAN OF CARE: Admit to inpatient   PATIENT DISPOSITION:  PACU - hemodynamically stable.   Delay start of Pharmacological VTE agent (>24hrs) due to surgical blood loss or risk of bleeding: no

## 2014-01-05 NOTE — Procedures (Signed)
I have seen and examined this patient and agree with the plan of care . Patient seen on dialysis and appears to be doing well with no complaints  Jack [name] W 01/05/2014, 2:19 PM

## 2014-01-05 NOTE — Progress Notes (Signed)
New Admission Note:   Arrival Method: Via stretcher from the ED Mental Orientation:  A&O x 4 Telemetry: Placed on Tele Box 10 Assessment: Completed Skin:  Healed left BKA incision and Rt necrotic heel ulcer IV: Rt AC 01/04/2014 Pain: Denies Tubes:  None Safety Measures: Safety Fall Prevention Plan has been given, discussed and signed Admission: Completed [facility] [place] Orientation: Patient has been orientated to the room, unit and staff.  Family: From home with family  Patient has history of MRSA-PCR sent to lab.  Patient had small loose incontinent stool upon arrival to the floor.  He states he has had several loose stools at home.  Implemented CDIFF precautions.  Will send next stool to the lab.  Patient is from home with his daughter.  His wheelchair from home was taken by his daughter home.    Orders have been reviewed and implemented. Will continue to monitor the patient. Call light has been placed within reach and bed alarm has been activated.   Earleen Reaper RN- [place], [place] Phone number: [phone]

## 2014-01-05 NOTE — Progress Notes (Signed)
Patient ID: Jack [name]  male  HAL:[id number]    DOB: 02-19-1945    DOA: 01/04/2014  PCP: [name], MD  Brief history of present illness  Patient is a 68 year old male with ESRD on HD MWF, PVD, left BKA, right foot partial application, type 2 diabetes, asthma, multiple myeloma presented with right heel ulcer and osteomyelitis Patient was sent for right heel MRI outpatient on 12/11. Changes on x-ray were concerning for cellulitis and early osteomyelitis. Patient presented with 3 days of progressive nausea, malaise, vomiting. Patient reported that he had missed his hemodialysis on Monday because he had to go to the court. Orthopedics was consulted.   Assessment/Plan: Principal Problem:  right heel ulcer with osteomyelitis - MRI of the right foot done on 12/11 showed skin ulceration of the posterior lateral plantar aspect of the heel, cellulitis with early osteomyelitis, no focal fluid collection. - Patient is placed on IV vancomycin and Zosyn -Orthopedics consulted, Dr. Ninfa Linden, recommending I&D in the [place] today 12/23 - Continue oxycodone for pain control  Active Problems:   Hypertension -Currently stable     Anemia in chronic kidney disease - Follow CBC    Hypothyroidism - Check TSH, continue Synthroid    Peripheral vascular disease, S/P BKA (below knee amputation) unilateral Left    Type 1 diabetes mellitus with diabetic foot infection Uncontrolled - Continue sliding scale insulin, hemoglobin A1c 7.5     ESRD on hemodialysis - Nephrology consulted, plan for hemodialysis today prior to the surgery  DVT Prophylaxis:Subcutaneous heparin   Code Status:Full code   Family Communication:  Disposition:  Consultants:  Orthopedics   Procedures:  Hemodialysis   Antibiotics:  IV vancomycin 12/22  IV zosyn 12/22   Subjective: Patient seen and examined, earlier this morning, awaiting surgery today   Objective: Weight change:   Intake/Output Summary (Last  24 hours) at 01/05/14 1353 Last data filed at 01/05/14 0700  Gross per 24 hour  Intake    460 ml  Output      0 ml  Net    460 ml   Blood pressure 101/62, pulse 67, temperature 98.4 F (36.9 C), temperature source Oral, resp. rate 16, height '5\' 10"'  (1.778 m), weight 73.1 kg (161 lb 2.5 oz), SpO2 99 %.  Physical Exam: General: Alert and awake, oriented x3, not in any acute distress. CVS: S1-S2 clear, no murmur rubs or gallops Chest: clear to auscultation bilaterally, no wheezing, rales or rhonchi Abdomen: soft nontender, nondistended, normal bowel sounds  Extremities:  left BKA,, right foot partial amputation, right heel with open ulcer, foul-smelling   Lab Results: Basic Metabolic Panel:  Recent Labs Lab 01/04/14 1630 01/04/14 1925 01/05/14 0500  NA 138  --  134*  K 4.5  --  5.1  CL 92*  --  91*  CO2 27  --  27  GLUCOSE 139*  --  78  BUN 48*  --  56*  CREATININE 10.98* 11.17* 11.69*  CALCIUM 9.8  --  8.7   Liver Function Tests:  Recent Labs Lab 01/05/14 0500  AST 20  ALT 12  ALKPHOS 71  BILITOT 0.9  PROT 6.1  ALBUMIN 2.7*   No results for input(s): LIPASE, AMYLASE in the last 168 hours. No results for input(s): AMMONIA in the last 168 hours. CBC:  Recent Labs Lab 01/04/14 1925 01/05/14 0500  WBC 10.5 6.6  NEUTROABS  --  4.2  HGB 11.7* 10.6*  HCT 37.5* 34.3*  MCV 84.1 84.7  PLT  274 253   Cardiac Enzymes: No results for input(s): CKTOTAL, CKMB, CKMBINDEX, TROPONINI in the last 168 hours. BNP: Invalid input(s): POCBNP CBG:  Recent Labs Lab 01/04/14 1356 01/04/14 2036 01/05/14 0017 01/05/14 0416 01/05/14 0805  GLUCAP 138* 107* 169* 89 84     Micro Results: Recent Results (from the past 240 hour(s))  Wound culture     Status: None (Preliminary result)   Collection Time: 01/04/14  4:33 PM  Result Value Ref Range Status   Specimen Description ULCER  Final   Special Requests Normal  Final   Gram Stain   Final    NO WBC SEEN NO SQUAMOUS  EPITHELIAL CELLS SEEN MODERATE GRAM POSITIVE COCCI IN PAIRS IN CLUSTERS Performed at [facility]    Culture NO GROWTH Performed at [facility]   Final   Report Status PENDING  Incomplete  Blood culture (routine x 2)     Status: None (Preliminary result)   Collection Time: 01/04/14  4:33 PM  Result Value Ref Range Status   Specimen Description BLOOD RIGHT ARM  Final   Special Requests BOTTLES DRAWN AEROBIC AND ANAEROBIC 5CC  Final   Culture  Setup Time   Final    01/04/2014 21:16 Performed at [facility]    Culture   Final           BLOOD CULTURE RECEIVED NO GROWTH TO DATE CULTURE WILL BE HELD FOR 5 DAYS BEFORE ISSUING A FINAL NEGATIVE REPORT Performed at [facility]    Report Status PENDING  Incomplete  Blood culture (routine x 2)     Status: None (Preliminary result)   Collection Time: 01/04/14  4:33 PM  Result Value Ref Range Status   Specimen Description BLOOD RIGHT HAND  Final   Special Requests BOTTLES DRAWN AEROBIC ONLY 3CC  Final   Culture  Setup Time   Final    01/04/2014 21:18 Performed at [facility]    Culture   Final           BLOOD CULTURE RECEIVED NO GROWTH TO DATE CULTURE WILL BE HELD FOR 5 DAYS BEFORE ISSUING A FINAL NEGATIVE REPORT Performed at [facility]    Report Status PENDING  Incomplete  MRSA PCR Screening     Status: None   Collection Time: 01/04/14  8:45 PM  Result Value Ref Range Status   MRSA by PCR NEGATIVE NEGATIVE Final    Comment:        The GeneXpert MRSA Assay (FDA approved for NASAL specimens only), is one component of a comprehensive MRSA colonization surveillance program. It is not intended to diagnose MRSA infection nor to guide or monitor treatment for MRSA infections.     Studies/Results: Dg Os Calcis Right  12/11/2013   CLINICAL DATA:  Open wound overlying the lateral aspect of heel  EXAM: RIGHT OS CALCIS - 2+ VIEW  COMPARISON:  None.  FINDINGS: Soft tissue swelling  overlying the calcaneus.  No definite underlying osseous destruction. However, this is poorly evaluated.  Prior resection of the forefoot.  Vascular calcifications.  IMPRESSION: No definite radiographic findings of osteomyelitis, although poorly evaluated.   Electronically Signed   By: Julian Hy M.D.   On: 12/11/2013 10:44   Mr Foot Right Wo Contrast  12/24/2013   CLINICAL DATA:  Lateral right heel spur  EXAM: MRI OF THE RIGHT FOREFOOT WITHOUT CONTRAST  TECHNIQUE: Multiplanar, multisequence MR imaging was performed. No intravenous contrast was administered.  COMPARISON:  None.  FINDINGS: Peroneal: Peroneal longus tendon intact. Peroneal brevis intact.  Posteromedial: Posterior tibial tendon intact. Flexor hallucis longus tendon intact. Flexor digitorum longus tendon intact. Muscle edema in the flexor hallucis longus muscle likely secondary to muscle strain.  Anterior: Tibialis anterior tendon intact. Extensor hallucis longus tendon intact Extensor digitorum longus tendon intact.  Achilles: Intact.  Plantar Fascia: Intact.  LIGAMENTS  Medial: Deltoid ligament intact. Spring ligament intact.  Lateral: Anterior talofibular ligament intact. Calcaneofibular ligament intact. Posterior talofibular ligament intact. Anterior and posterior tibiofibular ligaments intact.  Cartilage: Intact.  Ankle Joint: No joint effusion. No dislocation.  Subtalar Joint: No joint effusion.  Sinus Tarsi: Normal.  Bones: Skin ulceration over the posterior lateral plantar aspect of the heel with mild subcutaneous edema extending to the posterior lateral calcaneal cortex with mild cortical irregularity and marrow edema. There is no focal fluid collection.  There is no fracture or dislocation. There is patchy areas of T2 signal throughout the midfoot and hindfoot likely reflecting disuse osteopenia.  Mild increased T2 signal within the muscles which is likely neurogenic.  IMPRESSION: Skin ulceration over the posterior lateral plantar  aspect of the heel with mild subcutaneous edema extending to the posterior lateral calcaneal cortex with mild cortical irregularity and marrow edema concern for cellulitis and early osteomyelitis. There is no focal fluid collection.   Electronically Signed   By: Kathreen Devoid   On: 12/24/2013 14:27   Dg Foot 2 Views Right  01/04/2014   CLINICAL DATA:  Right foot ulcer lateral calcaneus, possible osteomyelitis  EXAM: RIGHT FOOT - 2 VIEW  COMPARISON:  None.  FINDINGS: Two views of the right foot submitted. No acute fracture or subluxation. The patient is status post amputation of distal metatarsals and toes. Extensive atherosclerotic vascular calcifications. No definite bone destruction to suggest osteomyelitis.  IMPRESSION: No acute fracture or subluxation. Status post amputation of distal metatarsals and toes. No definite bone destruction to suggest osteomyelitis   Electronically Signed   By: Lahoma Crocker M.D.   On: 01/04/2014 22:00    Medications: Scheduled Meds: . aspirin EC  81 mg Oral Daily  . calcium acetate  1,334 mg Oral TID WC  . colchicine  0.6 mg Oral Daily  . feeding supplement (RESOURCE BREEZE)  1 Container Oral TID BM  . gabapentin  200 mg Oral QHS  . heparin  5,000 Units Subcutaneous 3 times per day  . insulin aspart  0-9 Units Subcutaneous 6 times per day  . lanthanum  1,000 mg Oral TID WC  . levothyroxine  175 mcg Oral QAC breakfast  . multivitamin  1 tablet Oral QHS  . piperacillin-tazobactam (ZOSYN)  IV  2.25 g Intravenous Q8H  . sodium chloride  3 mL Intravenous Q12H  . sodium chloride  3 mL Intravenous Q12H  . vancomycin  750 mg Intravenous Q M,W,F-HD      LOS: 1 day   Tita Terhaar M.D. [facility] 01/05/2014, 1:53 PM Pager: [phone]  If 7PM-7AM, please contact night-coverage [email or web address] Password [email or web address]

## 2014-01-05 NOTE — Transfer of Care (Signed)
Immediate Anesthesia Transfer of Care Note  Patient: Jack [name]  Procedure(s) Performed: Procedure(s) with comments: IRRIGATION AND DEBRIDEMENT Right Heel Ulcer (Right) - Surgeon Available after 5PM  Patient Location: PACU  Anesthesia Type:General  Level of Consciousness: awake, alert  and oriented  Airway & Oxygen Therapy: Patient Spontanous Breathing and Patient connected to nasal cannula oxygen  Post-op Assessment: Report given to PACU RN and Post -op Vital signs reviewed and stable  Post vital signs: Reviewed and stable  Complications: No apparent anesthesia complications

## 2014-01-05 NOTE — Accreditation Note (Signed)
Hemodialysis= Tx stopped early. Pt complaining of pain at venous needle site. VP 190, needle flushes well. Repositioned several times without relief, minute swelling at area. Pt states he has not moved his arm. Pt wishes to end treatment d/t pain. Dr. Justin Mend informed of issue. UF 1 out of 2L goal. Placed ice on insertion site. Vitals remained stable. Will give antibiotics via peripheral iv.

## 2014-01-05 NOTE — Anesthesia Preprocedure Evaluation (Addendum)
Anesthesia Evaluation  Patient identified by MRN, date of birth, ID band Patient awake    Reviewed: Allergy & Precautions, H&P , NPO status , Patient's Chart, lab work & pertinent test results  Airway Mallampati: II  TM Distance: >3 FB Neck ROM: Full    Dental no notable dental hx. (+) Teeth Intact, Dental Advisory Given,    Pulmonary asthma , former smoker,  breath sounds clear to auscultation  Pulmonary exam normal       Cardiovascular hypertension, On Medications + Peripheral Vascular Disease + dysrhythmias + Valvular Problems/Murmurs Rhythm:Regular Rate:Normal     Neuro/Psych negative neurological ROS  negative psych ROS   GI/Hepatic negative GI ROS, Neg liver ROS,   Endo/Other  diabetesHypothyroidism   Renal/GU Dialysis and ESRFRenal disease  negative genitourinary   Musculoskeletal   Abdominal   Peds  Hematology  (+) Blood dyscrasia, ,   Anesthesia Other Findings   Reproductive/Obstetrics negative OB ROS                            Anesthesia Physical  Anesthesia Plan  ASA: III  Anesthesia Plan: General   Post-op Pain Management:    Induction: Intravenous  Airway Management Planned: LMA  Additional Equipment:   Intra-op Plan:   Post-operative Plan: Extubation in OR  Informed Consent: I have reviewed the patients History and Physical, chart, labs and discussed the procedure including the risks, benefits and alternatives for the proposed anesthesia with the patient or authorized representative who has indicated his/her understanding and acceptance.   Dental advisory given  Plan Discussed with: CRNA, Anesthesiologist and Surgeon  Anesthesia Plan Comments:         Anesthesia Quick Evaluation

## 2014-01-05 NOTE — Anesthesia Procedure Notes (Signed)
Procedure Name: LMA Insertion Date/Time: 01/05/2014 6:47 PM Performed by: Garner Nash Pre-anesthesia Checklist: Patient identified, Emergency Drugs available, Suction available, Patient being monitored and Timeout performed Patient Re-evaluated:Patient Re-evaluated prior to inductionOxygen Delivery Method: Circle system utilized Preoxygenation: Pre-oxygenation with 100% oxygen Intubation Type: IV induction LMA: LMA inserted LMA Size: 5.0 Number of attempts: 1 Placement Confirmation: breath sounds checked- equal and bilateral,  CO2 detector and positive ETCO2 Tube secured with: Tape Dental Injury: Teeth and Oropharynx as per pre-operative assessment  Comments: LMA 4 switched to LMA 5- leak around LMA cuff

## 2014-01-05 NOTE — Progress Notes (Signed)
UR Completed.  [phone]  

## 2014-01-05 NOTE — Consult Note (Signed)
[facility] Renal Consultation Note    Indication for Consultation:  Management of ESRD/hemodialysis; anemia, hypertension/volume and secondary hyperparathyroidism  HPI: Jack [name] is a 68 y.o. male with ESRD secondary to DM, MGUS vs MM, chronic left foot wounds, BKA earlier this year, now with right heel ulcer (neg osteo on xray) which has been followed for local wound care.  MRI heel 12/11 suggestive of cellulitis/early osteo.  He presented to ED with 3 day history of N,V and malaise, no fevers, but several episodes of mild diarrhea.  Last HD was 12/22; missed Monday because he woke up at 4 am vomiting (did not go to court). When he went to the wound care center today, he had more vomiting, was seen by Dr. Lindon Romp and sent to the [facility] ED for admission and further treatment of his right hee lulcer. Actually today feels much better. No SOB, cough. Had abdominal pain yesterday but not today.  Lives with daughter. Tells me he is having surgery this afternoon.  Past Medical History  Diagnosis Date  . Hypertension   . Pneumonia     2012  . Heart murmur   . Glaucoma   . Multiple myeloma, without mention of having achieved remission 03/30/2012    Cytogenetic neg on 03/23/2012.  . Asthma   . Hyperparathyroidism, secondary renal   . Peripheral arterial disease   . End-stage renal disease on hemodialysis     Started HD March 2014.  Cause of ESRD was DM.  Gets HD at [facility] on [address] on MWF schedule.   . Thyroid disease     hyperparathyroidism  . MRSA bacteremia   . Anemia   . Peripheral vascular disease, unspecified 11/19/2012    In the past had R foot toe amps then R TMA. In 2015 had left foot toe amp > then TMA >then L BKA on 05/14/13   . History of MRSA infection 04/22/2013    Bacteremia assoc w L foot wound infection Mar 2015   . Gangrene of foot   . ESRD on hemodialysis   . Diabetes mellitus without complication     Type 2  . Diabetes mellitus with peripheral vascular  disease    Past Surgical History  Procedure Laterality Date  . Thyroidectomy    . Cervical disc surgery    . Insertion of dialysis catheter Right 03/19/2012    Procedure: INSERTION OF DIALYSIS CATHETER;  Surgeon: Mal Misty, MD;  Location: [place];  Service: Vascular;  Laterality: Right;  Right Internal Jugular  . Av fistula placement Left 03/25/2012    Procedure: ARTERIOVENOUS (AV) FISTULA CREATION;  Surgeon: Mal Misty, MD;  Location: [place];  Service: Vascular;  Laterality: Left;  . Ligation of competing branches of arteriovenous fistula Left 05/08/2012    Procedure: LIGATION OF COMPETING BRANCHES OF ARTERIOVENOUS FISTULA;  Surgeon: Mal Misty, MD;  Location: [place];  Service: Vascular;  Laterality: Left;  Ultrasound guided  . Cardiac catheterization      approx 30 years ago  . Amputation Right 11/10/2012    Procedure: AMPUTATION FIRST and SECOND TOES Right Foot;  Surgeon: Elam Dutch, MD;  Location: [facility] OR;  Service: Vascular;  Laterality: Right;  . Transmetatarsal amputation Left 12/16/2012    Procedure: TRANSMETATARSAL AMPUTATION AND VAC PLACEMENT;  Surgeon: Elam Dutch, MD;  Location: [place];  Service: Vascular;  Laterality: Left;  . Amputation Left 04/07/2013    Procedure: AMPUTATION DIGIT- LEFT 1ST TOE;  Surgeon: Jeneen Rinks  Rockwell Alexandria, MD;  Location: [place];  Service: Vascular;  Laterality: Left;  . Tee without cardioversion N/A 04/20/2013    Procedure: TRANSESOPHAGEAL ECHOCARDIOGRAM (TEE);  Surgeon: Josue Hector, MD;  Location: [facility] ENDOSCOPY;  Service: Cardiovascular;  Laterality: N/A;  . I&d extremity Left 04/22/2013    Procedure: LAGTXMIWOE HOZ DEBRIDEMENT LEFT FIRST TOE AMPUTATION WOUND ;  Surgeon: Mal Misty, MD;  Location: [place];  Service: Vascular;  Laterality: Left;  . Amputation Left 04/26/2013    Procedure: Left Foot Transmetatarsal Amputation;  Surgeon: Newt Minion, MD;  Location: [place];  Service: Orthopedics;  Laterality: Left;  . Toe amputation      D/C 04-30-13   . Below knee leg amputation Left 05/14/2013    DR [name]  . Amputation Left 05/14/2013    Procedure: AMPUTATION BELOW KNEE;  Surgeon: Newt Minion, MD;  Location: [place];  Service: Orthopedics;  Laterality: Left;  Left Below Knee Amputation  . Eye surgery Bilateral     CATARACTS  . Amputation Left 07/23/2013    Procedure: AMPUTATION BELOW KNEE;  Surgeon: Newt Minion, MD;  Location: [place];  Service: Orthopedics;  Laterality: Left;  Left Below Knee Amputation Revision  . Abdominal aortagram Bilateral 11/06/2012    Procedure: ABDOMINAL AORTAGRAM;  Surgeon: Elam Dutch, MD;  Location: [facility] CATH LAB;  Service: Cardiovascular;  Laterality: Bilateral;   Family History  Problem Relation Age of Onset  . Diabetes Mother   . Cancer Mother     bone   . Kidney disease Father    Social History:  reports that he quit smoking about 31 years ago. His smoking use included Cigarettes. He smoked 0.00 packs per day. He has never used smokeless tobacco. He reports that he does not drink alcohol or use illicit drugs. Allergies  Allergen Reactions  . Bee Venom Anaphylaxis  . Lisinopril Cough  . Ivp Dye [Iodinated Diagnostic Agents] Hives  . Morphine And Related Other (See Comments)    Bradycardia states patient   Prior to Admission medications   Medication Sig Start Date End Date Taking? Authorizing Provider  aspirin EC 81 MG tablet Take 81 mg by mouth daily.   Yes Historical Provider, MD  b complex-vitamin c-folic acid (NEPHRO-VITE) 0.8 MG TABS tablet Take 1 tablet by mouth daily.   Yes Historical Provider, MD  calcium acetate (PHOSLO) 667 MG capsule Take 1,334 mg by mouth 3 (three) times daily with meals.   Yes Historical Provider, MD  colchicine 0.6 MG tablet Take 0.6 mg by mouth daily.   Yes Historical Provider, MD  feeding supplement, RESOURCE BREEZE, (RESOURCE BREEZE) LIQD Take 1 Container by mouth 3 (three) times daily between meals.   Yes Historical Provider, MD  gabapentin (NEURONTIN) 100 MG  capsule Take 2 capsules (200 mg total) by mouth at bedtime. 12/14/13  Yes Meredith Staggers, MD  lanthanum (FOSRENOL) 1000 MG chewable tablet Chew 1,000 mg by mouth 3 (three) times daily with meals.   Yes Historical Provider, MD  levothyroxine (SYNTHROID, LEVOTHROID) 175 MCG tablet Take 175 mcg by mouth daily before breakfast.   Yes Historical Provider, MD  multivitamin (RENA-VIT) TABS tablet Take 1 tablet by mouth at bedtime.   Yes Historical Provider, MD  Oxycodone HCl 10 MG TABS Take 5-10 mg by mouth every 6 (six) hours as needed (for pain).   Yes Historical Provider, MD  oxyCODONE-acetaminophen (ROXICET) 5-325 MG per tablet Take 1 tablet by mouth every 8 (eight) hours as needed  for severe pain. 12/14/13  Yes Meredith Staggers, MD   Current Facility-Administered Medications  Medication Dose Route Frequency Provider Last Rate Last Dose  . 0.9 %  sodium chloride infusion  250 mL Intravenous PRN Shanda Howells, MD      . aspirin EC tablet 81 mg  81 mg Oral Daily Shanda Howells, MD   81 mg at 01/04/14 2305  . calcium acetate (PHOSLO) capsule 1,334 mg  1,334 mg Oral TID WC Shanda Howells, MD   1,334 mg at 01/05/14 8115  . colchicine tablet 0.6 mg  0.6 mg Oral Daily Shanda Howells, MD   0.6 mg at 01/04/14 2305  . feeding supplement (RESOURCE BREEZE) (RESOURCE BREEZE) liquid 1 Container  1 Container Oral TID BM Shanda Howells, MD   1 Container at 01/04/14 2306  . gabapentin (NEURONTIN) capsule 200 mg  200 mg Oral QHS Shanda Howells, MD   200 mg at 01/04/14 2305  . heparin injection 5,000 Units  5,000 Units Subcutaneous 3 times per day Shanda Howells, MD   5,000 Units at 01/04/14 2305  . insulin aspart (novoLOG) injection 0-9 Units  0-9 Units Subcutaneous 6 times per day Shanda Howells, MD   2 Units at 01/05/14 0032  . lanthanum (FOSRENOL) chewable tablet 1,000 mg  1,000 mg Oral TID WC Shanda Howells, MD   1,000 mg at 01/05/14 7262  . levothyroxine (SYNTHROID, LEVOTHROID) tablet 175 mcg  175 mcg Oral QAC breakfast  Shanda Howells, MD   175 mcg at 01/05/14 0748  . multivitamin (RENA-VIT) tablet 1 tablet  1 tablet Oral QHS Shanda Howells, MD   0 tablet at 01/04/14 2307  . multivitamin (RENA-VIT) tablet 1 tablet  1 tablet Oral QHS Shanda Howells, MD   1 tablet at 01/04/14 2305  . oxyCODONE (Oxy IR/ROXICODONE) immediate release tablet 5-10 mg  5-10 mg Oral Q6H PRN Shanda Howells, MD      . piperacillin-tazobactam (ZOSYN) IVPB 2.25 g  2.25 g Intravenous Q8H Rebecka Apley, RPH   2.25 g at 01/05/14 0534  . sodium chloride 0.9 % injection 3 mL  3 mL Intravenous Q12H Shanda Howells, MD   0 mL at 01/04/14 2307  . sodium chloride 0.9 % injection 3 mL  3 mL Intravenous Q12H Shanda Howells, MD   3 mL at 01/04/14 2306  . sodium chloride 0.9 % injection 3 mL  3 mL Intravenous PRN Shanda Howells, MD      . vancomycin (VANCOCIN) IVPB 750 mg/150 ml premix  750 mg Intravenous Q M,W,F-HD Rebecka Apley, [facility]       Labs: Basic Metabolic Panel:  Recent Labs Lab 01/04/14 1630 01/04/14 1925 01/05/14 0500  NA 138  --  134*  K 4.5  --  5.1  CL 92*  --  91*  CO2 27  --  27  GLUCOSE 139*  --  78  BUN 48*  --  56*  CREATININE 10.98* 11.17* 11.69*  CALCIUM 9.8  --  8.7   Liver Function Tests:  Recent Labs Lab 01/05/14 0500  AST 20  ALT 12  ALKPHOS 71  BILITOT 0.9  PROT 6.1  ALBUMIN 2.7*  CBC:  Recent Labs Lab 01/04/14 1630 01/04/14 1925 01/05/14 0500  WBC 9.9 10.5 6.6  NEUTROABS 7.9*  --  4.2  HGB 13.5 11.7* 10.6*  HCT 41.8 37.5* 34.3*  MCV 86.4 84.1 84.7  PLT 290 274 253   CBG:  Recent Labs Lab 01/04/14 1356 01/04/14 2036 01/05/14 0017 01/05/14 0416  01/05/14 0805  GLUCAP 138* 107* 169* 89 84   Studies/Results: Dg Foot 2 Views Right  01/04/2014   CLINICAL DATA:  Right foot ulcer lateral calcaneus, possible osteomyelitis  EXAM: RIGHT FOOT - 2 VIEW  COMPARISON:  None.  FINDINGS: Two views of the right foot submitted. No acute fracture or subluxation. The patient is status post amputation of distal  metatarsals and toes. Extensive atherosclerotic vascular calcifications. No definite bone destruction to suggest osteomyelitis.  IMPRESSION: No acute fracture or subluxation. Status post amputation of distal metatarsals and toes. No definite bone destruction to suggest osteomyelitis   Electronically Signed   By: Lahoma Crocker M.D.   On: 01/04/2014 22:00    ROS: As per HPI otherwise negative.  Physical Exam: Filed Vitals:   01/04/14 2223 01/04/14 2230 01/05/14 0633 01/05/14 0634  BP: 85/45 100/52 78/44 105/54  Pulse: 71  55   Temp: 98.8 F (37.1 C)  98.6 F (37 C)   TempSrc: Oral  Oral   Resp: 18  18   Height:      Weight:      SpO2: 98%  98%      General: elderly AA M in no acute distress. Head: Normocephalic, atraumatic, sclera non-icteric, mucus membranes are moist Neck: Supple. JVD not elevated. Lungs: Clear bilaterally to auscultation without wheezes, rales, or rhonchi. Breathing is unlabored. Heart: RRR with S1 S2. No murmurs, rubs, or gallops appreciated. Abdomen: Soft, non-tender, non-distended with normoactive bowel sounds. No rebound/guarding. No obvious abdominal masses. Lower extremities: right transmet LE without edema - left heel dressing in place; right BKA no edmea Neuro: Alert and oriented X 3. Moves all extremities spontaneously. Psych:  Responds to questions appropriately with a normal affect. Dialysis Access:left upper AVF + bruit  Dialysis Orders: [facility] MWF 180 400/800 4 hr 2 K 2 Ca profile 4 EDW 70 left upper AVF heparin 3500 no ESA, Fe or Hectorol  Recent labs:  Hgb 12.4 12/16 iPTH 275   Assessment/Plan: 1.  right heel ulcer with possible osteo - seen by irtho for I & D; empiric Vanc and Zosyn 2.  ESRD -  MWF - HD today per routine - next HD [place] 3.  Hypertension/volume  - leaving below edw lately gains modest 4.  Anemia  - Hgb has been > 12 off ESAs, noted 13.5 yesterday and 10.6 today - watch if drops further need to resume Aranesp 5.  Metabolic bone disease  -  Not on Hectorol - had been on 1 last month but stopped due to corr Ca ^ - ; continue binders 6.  Nutrition - renal diet + supplements and vitamin; currently NPO for surgery  [name], PA-C [place] [phone] 01/05/2014, 9:49 AM

## 2014-01-05 NOTE — Anesthesia Postprocedure Evaluation (Signed)
  Anesthesia Post-op Note  Patient: Jack [name]  Procedure(s) Performed: Procedure(s) with comments: IRRIGATION AND DEBRIDEMENT Right Heel Ulcer (Right) - Surgeon Available after 5PM  Patient Location: PACU  Anesthesia Type:General  Level of Consciousness: awake, alert  and oriented  Airway and Oxygen Therapy: Patient Spontanous Breathing and Patient connected to nasal cannula oxygen  Post-op Pain: mild  Post-op Assessment: Post-op Vital signs reviewed, Patient's Cardiovascular Status Stable, Respiratory Function Stable, Patent Airway, No signs of Nausea or vomiting and Pain level controlled  Post-op Vital Signs: stable  Last Vitals:  Filed Vitals:   01/05/14 2010  BP:   Pulse: 62  Temp: 36.3 C  Resp: 15    Complications: No apparent anesthesia complications

## 2014-01-06 ENCOUNTER — Encounter (HOSPITAL_COMMUNITY): Payer: Self-pay | Admitting: Orthopaedic Surgery

## 2014-01-06 ENCOUNTER — Ambulatory Visit: Payer: Medicare Other | Admitting: Physical Therapy

## 2014-01-06 LAB — COMPREHENSIVE METABOLIC PANEL
ALT: 9 U/L (ref 0–53)
AST: 11 U/L (ref 0–37)
Albumin: 2.6 g/dL — ABNORMAL LOW (ref 3.5–5.2)
Alkaline Phosphatase: 63 U/L (ref 39–117)
Anion gap: 12 (ref 5–15)
BUN: 30 mg/dL — ABNORMAL HIGH (ref 6–23)
CALCIUM: 8.4 mg/dL (ref 8.4–10.5)
CO2: 25 mmol/L (ref 19–32)
Chloride: 96 mEq/L (ref 96–112)
Creatinine, Ser: 7.86 mg/dL — ABNORMAL HIGH (ref 0.50–1.35)
GFR, EST AFRICAN AMERICAN: 7 mL/min — AB (ref >90)
GFR, EST NON AFRICAN AMERICAN: 6 mL/min — AB (ref >90)
GLUCOSE: 115 mg/dL — AB (ref 70–99)
Potassium: 4.4 mmol/L (ref 3.5–5.1)
Sodium: 133 mmol/L — ABNORMAL LOW (ref 135–145)
Total Bilirubin: 0.6 mg/dL (ref 0.3–1.2)
Total Protein: 6.1 g/dL (ref 6.0–8.3)

## 2014-01-06 LAB — CBC WITH DIFFERENTIAL/PLATELET
BASOS ABS: 0 10*3/uL (ref 0.0–0.1)
Basophils Relative: 0 % (ref 0–1)
EOS ABS: 0.2 10*3/uL (ref 0.0–0.7)
Eosinophils Relative: 3 % (ref 0–5)
HCT: 32.3 % — ABNORMAL LOW (ref 39.0–52.0)
Hemoglobin: 9.8 g/dL — ABNORMAL LOW (ref 13.0–17.0)
Lymphocytes Relative: 23 % (ref 12–46)
Lymphs Abs: 1.4 10*3/uL (ref 0.7–4.0)
MCH: 25.9 pg — AB (ref 26.0–34.0)
MCHC: 30.3 g/dL (ref 30.0–36.0)
MCV: 85.4 fL (ref 78.0–100.0)
Monocytes Absolute: 0.8 10*3/uL (ref 0.1–1.0)
Monocytes Relative: 12 % (ref 3–12)
NEUTROS PCT: 62 % (ref 43–77)
Neutro Abs: 3.8 10*3/uL (ref 1.7–7.7)
PLATELETS: 231 10*3/uL (ref 150–400)
RBC: 3.78 MIL/uL — ABNORMAL LOW (ref 4.22–5.81)
RDW: 14.2 % (ref 11.5–15.5)
WBC: 6.2 10*3/uL (ref 4.0–10.5)

## 2014-01-06 LAB — GLUCOSE, CAPILLARY
GLUCOSE-CAPILLARY: 96 mg/dL (ref 70–99)
Glucose-Capillary: 156 mg/dL — ABNORMAL HIGH (ref 70–99)
Glucose-Capillary: 91 mg/dL (ref 70–99)

## 2014-01-06 MED ORDER — DEXTROSE 5 % IV SOLN: 2.0000 g | INTRAVENOUS | Status: DC

## 2014-01-06 MED ORDER — PROMETHAZINE HCL 12.5 MG PO TABS: 12.5000 mg | ORAL_TABLET | Freq: Four times a day (QID) | ORAL | Status: DC | PRN

## 2014-01-06 MED ORDER — DEXTROSE 5 % IV SOLN
2.0000 g | INTRAVENOUS | Status: DC
  Filled 2014-01-06: qty 2

## 2014-01-06 MED ORDER — OXYCODONE HCL 10 MG PO TABS: 5.0000 mg | ORAL_TABLET | Freq: Four times a day (QID) | ORAL | Status: DC | PRN

## 2014-01-06 MED ORDER — COLCHICINE 0.6 MG PO TABS: 0.6000 mg | ORAL_TABLET | ORAL | Status: DC

## 2014-01-06 MED ORDER — DOXYCYCLINE HYCLATE 100 MG PO CAPS: 100.0000 mg | ORAL_CAPSULE | Freq: Two times a day (BID) | ORAL | Status: DC

## 2014-01-06 MED ORDER — VANCOMYCIN HCL IN DEXTROSE 750-5 MG/150ML-% IV SOLN: 750.0000 mg | INTRAVENOUS | Status: DC

## 2014-01-06 NOTE — Discharge Instructions (Signed)
Keep you right foot dressing clean and dry and do not remove it until your outpatient orthopedic follow-up. Try to keep pressure off of your right heel especially in be (float your heel with pillows)

## 2014-01-06 NOTE — Discharge Summary (Signed)
Physician Discharge Summary  Patient ID: Jack [name] MRN: [id number] DOB/AGE: 1945/10/14 68 y.o.  Admit date: 01/04/2014 Discharge date: 01/06/2014  Primary Care Physician:  [name], MD  Discharge Diagnoses:    . right foot full-thickness heel ulcer with acute osteomyelitis of the calcaneus  . Type 1 diabetes mellitus with diabetic foot infection . Peripheral vascular disease . Hypothyroidism . Hypertension . Anemia in chronic kidney disease  Consults:  Orthopedics, Dr. Ninfa Linden Nephrology, Dr Justin Mend   Recommendations for Outpatient Follow-up:   Patient will need IV vancomycin and Fortaz for 2 weeks with hemodialysis   DIET:  carb modified diet    Allergies:   Allergies  Allergen Reactions  . Bee Venom Anaphylaxis  . Lisinopril Cough  . Ivp Dye [Iodinated Diagnostic Agents] Hives  . Morphine And Related Other (See Comments)    Bradycardia states patient     Discharge Medications:   Medication List    STOP taking these medications        oxyCODONE-acetaminophen 5-325 MG per tablet  Commonly known as:  ROXICET      TAKE these medications        aspirin EC 81 MG tablet  Take 81 mg by mouth daily.     multivitamin Tabs tablet  Take 1 tablet by mouth at bedtime.     b complex-vitamin c-folic acid 0.8 MG Tabs tablet  Take 1 tablet by mouth daily.     calcium acetate 667 MG capsule  Commonly known as:  PHOSLO  Take 1,334 mg by mouth 3 (three) times daily with meals.     cefTAZidime 2 g in dextrose 5 % 50 mL  Inject 2 g into the vein every Saturday with hemodialysis. One dose per holiday schedule  Start taking on:  01/08/2014     cefTAZidime 2 g in dextrose 5 % 50 mL  Inject 2 g into the vein every Monday, Wednesday, and Friday with hemodialysis. X 2 weeks  Start taking on:  01/10/2014     colchicine 0.6 MG tablet  Take 1 tablet (0.6 mg total) by mouth 2 (two) times a week.  Start taking on:  01/10/2014     feeding supplement  (RESOURCE BREEZE) Liqd  Take 1 Container by mouth 3 (three) times daily between meals.     gabapentin 100 MG capsule  Commonly known as:  NEURONTIN  Take 2 capsules (200 mg total) by mouth at bedtime.     lanthanum 1000 MG chewable tablet  Commonly known as:  FOSRENOL  Chew 1,000 mg by mouth 3 (three) times daily with meals.     levothyroxine 175 MCG tablet  Commonly known as:  SYNTHROID, LEVOTHROID  Take 175 mcg by mouth daily before breakfast.     Oxycodone HCl 10 MG Tabs  Take 0.5-1 tablets (5-10 mg total) by mouth every 6 (six) hours as needed (for severe pain).     promethazine 12.5 MG tablet  Commonly known as:  PHENERGAN  Take 1 tablet (12.5 mg total) by mouth every 6 (six) hours as needed for nausea or vomiting.     Vancomycin 750 MG/150ML Soln  Commonly known as:  VANCOCIN  Inject 150 mLs (750 mg total) into the vein every Monday, Wednesday, and Friday with hemodialysis. X 2 weeks         Brief H and P: For complete details please refer to admission H and P, but in brief Patient is a 68 year old male with ESRD on HD MWF, PVD, left  BKA, right foot partial application, type 2 diabetes, asthma, multiple myeloma presented with right heel ulcer and osteomyelitis Patient was sent for right heel MRI outpatient on 12/11. Changes on x-ray were concerning for cellulitis and early osteomyelitis. Patient presented with 3 days of progressive nausea, malaise, vomiting. Patient reported that he had missed his hemodialysis on Monday because he had to go to the court. Orthopedics was consulted.  Hospital Course:  right heel ulcer with osteomyelitis  MRI of the right foot done on 12/11 showed skin ulceration of the posterior lateral plantar aspect of the heel, cellulitis with early osteomyelitis, no focal fluid collection. Patient was placed on IV vancomycin and Zosyn. Orthopedics was consulted, Dr. Ninfa Linden. Patient underwent irrigation and debridement of the right foot wound. Patient is  cleared for discharge per the orthopedic recommendations, he should continue dressing for 2 weeks, float his right heel off of the bed at all times to keep pressure off the surgical site. Patient to continue vancomycin and Fortaz for 2 weeks with hemodialysis.  Continue oxycodone for pain control   Hypertension -Currently stable   Anemia in chronic kidney disease stable   Hypothyroidism continue Synthroid   Peripheral vascular disease, S/P BKA (below knee amputation) unilateral Left   Type 1 diabetes mellitus with diabetic foot infection Uncontrolled hemoglobin A1c 7.5 , follow-up with PCP, needs to be oral hypoglycemic medications   ESRD on hemodialysis Patient underwent hemodialysis on 12/23 for his outpatient schedule    Day of Discharge BP 96/53 mmHg  Pulse 62  Temp(Src) 98 F (36.7 C) (Oral)  Resp 16  Ht _0  (1.778 m)  Wt 73.5 kg (162 lb 0.6 oz)  BMI 23.25 kg/m2  SpO2 93%  Physical Exam: General: Alert and awake oriented x3 not in any acute distress. CVS: S1-S2 clear no murmur rubs or gallops Chest: clear to auscultation bilaterally, no wheezing rales or rhonchi Abdomen: soft nontender, nondistended, normal bowel sounds Extremities: Left BKA, right lower extremity dressing intact    The results of significant diagnostics from this hospitalization (including imaging, microbiology, ancillary and laboratory) are listed below for reference.    LAB RESULTS: Basic Metabolic Panel:  Recent Labs Lab 01/05/14 0500 01/06/14 0441  NA 134* 133*  K 5.1 4.4  CL 91* 96  CO2 27 25  GLUCOSE 78 115*  BUN 56* 30*  CREATININE 11.69* 7.86*  CALCIUM 8.7 8.4   Liver Function Tests:  Recent Labs Lab 01/05/14 0500 01/06/14 0441  AST 20 11  ALT 12 9  ALKPHOS 71 63  BILITOT 0.9 0.6  PROT 6.1 6.1  ALBUMIN 2.7* 2.6*   No results for input(s): LIPASE, AMYLASE in the last 168 hours. No results for input(s): AMMONIA in the last 168 hours. CBC:  Recent  Labs Lab 01/05/14 0500 01/06/14 0441  WBC 6.6 6.2  NEUTROABS 4.2 3.8  HGB 10.6* 9.8*  HCT 34.3* 32.3*  MCV 84.7 85.4  PLT 253 231   Cardiac Enzymes: No results for input(s): CKTOTAL, CKMB, CKMBINDEX, TROPONINI in the last 168 hours. BNP: Invalid input(s): POCBNP CBG:  Recent Labs Lab 01/06/14 0505 01/06/14 0822  GLUCAP 96 91    Significant Diagnostic Studies:  Dg Foot 2 Views Right  01-20-14   CLINICAL DATA:  Right foot ulcer lateral calcaneus, possible osteomyelitis  EXAM: RIGHT FOOT - 2 VIEW  COMPARISON:  None.  FINDINGS: Two views of the right foot submitted. No acute fracture or subluxation. The patient is status post amputation of distal metatarsals and toes.  Extensive atherosclerotic vascular calcifications. No definite bone destruction to suggest osteomyelitis.  IMPRESSION: No acute fracture or subluxation. Status post amputation of distal metatarsals and toes. No definite bone destruction to suggest osteomyelitis   Electronically Signed   By: Lahoma Crocker M.D.   On: 01/04/2014 22:00      Disposition and Follow-up:     Discharge Instructions    Diet - low sodium heart healthy    Complete by:  As directed      Discharge instructions    Complete by:  As directed   [place] 2 WEEKS Keep foot elevated when in bed.     Increase activity slowly    Complete by:  As directed             DISPOSITION: home   DISCHARGE FOLLOW-UP Follow-up Information    Follow up with Mcarthur Rossetti, MD. Schedule an appointment as soon as possible for a visit in 2 weeks.   Specialty:  Orthopedic Surgery   Contact information:   [address] [place] [address] [phone]       Follow up with [name], MD. Schedule an appointment as soon as possible for a visit in 2 weeks.   Specialty:  Family Medicine   Why:  for hospital follow-up   Contact information:   [address] [place] [address] [phone]        Time  spent on Discharge: 39 mins  Signed:   Talulah Schirmer M.D. [facility] 01/06/2014, 12:33 PM Pager: [phone]

## 2014-01-06 NOTE — Progress Notes (Signed)
Patient ID: Jack [name], male   DOB: Oct 17, 1945, 68 y.o.   MRN: [id number] Doing well overall.  Feels better.  I was able to remove the necrotic area of his right calcaneus that showed obvious osteo and got back to normal bone.  I was also able to excise all necrotic tissue and bring the wound back together.  There was good bleeding tissue.  He now has a well-padded dressing that needs to stay on for the next 2 weeks until his outpatient follow-up.  He needs to float his right heel off of the bed at all times to keep pressure off of the surgical site.  He should be able to be discharged home today just resuming the oral antibiotics he was on prior to admission.

## 2014-01-06 NOTE — Op Note (Signed)
NAME:  Jack [name], Jack [name] NO.:  [id number]  MEDICAL RECORD NO.:  [id number]  LOCATION:                                 FACILITY:  PHYSICIAN:  Lind Guest. Ninfa Linden, M.D.DATE OF BIRTH:  1945-07-28  DATE OF PROCEDURE:  01/05/2014 DATE OF DISCHARGE:  01/06/2014                              OPERATIVE REPORT   PREOPERATIVE DIAGNOSIS:  Right foot full-thickness heel ulcer with acute osteomyelitis of the calcaneus.  PREOPERATIVE DIAGNOSIS:  Right foot full-thickness heel ulcer with acute osteomyelitis of the calcaneus.  PROCEDURE: 1. Irrigation and debridement of right foot wound with sharp     excisional debridement of skin, soft tissue, and bone. 2. Partial calcaneus excision. 3. Primary closure of right foot wound.  SURGEON:  Lind Guest. Ninfa Linden, M.D.  ANESTHESIA:  General.  BLOOD LOSS:  Less than 100 mL.  COMPLICATIONS:  None.  INDICATIONS:  Mr. Diiorio is a 68 year old gentleman with diabetes on dialysis, who has had a previous left below-knee amputation.  He has also had a previous right midfoot amputation.  He lays with his pressure on the lateral aspect of his heel and developed a heel wound, eventually became a full-thickness ulcer and an MRI recently showed acute osteomyelitis.  He had been feeling sick and lethargic and had nausea and vomiting.  He was admitted to the hospital late yesterday to the hospital service and started on IV antibiotics.  I reviewed the MRI and the wound in this necrotic base as well as obvious acute osteomyelitis to the bone and recommended a I and D of the wound with partial calcaneus excision and attempt to close the wound.  It was going to still be important that he has his heels floated at all times to take pressure off the wound.  His family understand this more than anything to get this to try to heal.  PROCEDURE DESCRIPTION:  After informed consent was obtained, appropriate right leg was marked.  He was  brought to the operating room and placed supine on the operating table, where general anesthesia was then obtained.  His right leg, foot, and ankle were prepped and draped with DuraPrep and sterile drapes.  A time-out was called to identify correct patient, correct right foot.  I then used a #10 blade and ellipsed out a large at least 4-5 cm x 3 cm necrotic wound.  It went directly to the calcaneus with exposed bone.  I sharply excised the skin, soft tissue, and bone, as well as used a rongeur against the bone and osteotomes to completely excise the lateral portion of the calcaneus.  This allowed the soft tissue to loosen up and had good bleeding tissue.  I then used 3 L of pulsatile lavage solution to lavage through the heal.  I was able to reapproximate the skin edges and to hopefully allow for some healing, but it was certainly a tight closure and some aspects of the skin did not blanch.  Again, there was good bleeding tissue.  I got it closed and placed with Xeroform and a well-padded sterile dressing.  He was awakened, extubated and taken to recovery room in stable condition.  All final counts were correct.  There  were no complications noted.  I will have to continue to reinforce to him and his family and nursing to float his heel at all time to get pressure off it, but to hopefully get healing.  We will hopefully can discharge him from the hospital potentially tomorrow with further instructions for wound care at home.     Lind Guest. Ninfa Linden, M.D.     CYB/MEDQ  D:  01/05/2014  T:  01/06/2014  Job:  [id number]

## 2014-01-06 NOTE — Care Management Note (Addendum)
CARE MANAGEMENT NOTE 01/06/2014  Patient:  Jack [name], Jack [name]   Account Number:  [id number]  Date Initiated:  01/06/2014  Documentation initiated by:  Tommy Minichiello  Subjective/Objective Assessment:   CM following for progression and d/c planning.     Action/Plan:   01/06/2014 Met with pt who is very anxious to d/c to home.    Anticipated DC Date:  01/08/2014   Anticipated DC Plan:  [place]         Choice offered to / List presented to:             Status of service:  In process, will continue to follow Medicare Important Message given?  YES (If response is "NO", the following Medicare IM given date fields will be blank) Date Medicare IM given:  01/06/2014 Medicare IM given by:  Deleon Passe Date Additional Medicare IM given:   Additional Medicare IM given by:    Discharge Disposition:    Per UR Regulation:    If discussed at Long Length of Stay Meetings, dates discussed:    Comments:

## 2014-01-06 NOTE — Progress Notes (Signed)
ANTIBIOTIC CONSULT NOTE - INITIAL  Pharmacy Consult for ceftazidime, vancomycin  Indication: osteomyelitis  Allergies  Allergen Reactions  . Bee Venom Anaphylaxis  . Lisinopril Cough  . Ivp Dye [Iodinated Diagnostic Agents] Hives  . Morphine And Related Other (See Comments)    Bradycardia states patient    Patient Measurements: Height: '5\' 10"'  (177.8 cm) Weight: 162 lb 0.6 oz (73.5 kg) IBW/kg (Calculated) : 73 Adjusted Body Weight:   Vital Signs: Temp: 98 F (36.7 C) (12/24 0920) Temp Source: Oral (12/24 0920) BP: 96/53 mmHg (12/24 0920) Pulse Rate: 62 (12/24 0920) Intake/Output from previous day: 12/23 0701 - 12/24 0700 In: 550 [I.V.:300; IV Piggyback:250] Out: 8811 [Blood:20] Intake/Output from this shift:    Labs:  Recent Labs  01/04/14 1925 01/05/14 0500 01/06/14 0441  WBC 10.5 6.6 6.2  HGB 11.7* 10.6* 9.8*  PLT 274 253 231  CREATININE 11.17* 11.69* 7.86*   Estimated Creatinine Clearance: 9 mL/min (by C-G formula based on Cr of 7.86). No results for input(s): VANCOTROUGH, VANCOPEAK, VANCORANDOM, GENTTROUGH, GENTPEAK, GENTRANDOM, TOBRATROUGH, TOBRAPEAK, TOBRARND, AMIKACINPEAK, AMIKACINTROU, AMIKACIN in the last 72 hours.   Medical History: Past Medical History  Diagnosis Date  . Hypertension   . Pneumonia     2012  . Heart murmur   . Glaucoma   . Multiple myeloma, without mention of having achieved remission 03/30/2012    Cytogenetic neg on 03/23/2012.  . Asthma   . Hyperparathyroidism, secondary renal   . Peripheral arterial disease   . End-stage renal disease on hemodialysis     Started HD March 2014.  Cause of ESRD was DM.  Gets HD at [facility] on [address] on MWF schedule.   . Thyroid disease     hyperparathyroidism  . MRSA bacteremia   . Anemia   . Peripheral vascular disease, unspecified 11/19/2012    In the past had R foot toe amps then R TMA. In 2015 had left foot toe amp > then TMA >then L BKA on 05/14/13   . History of MRSA infection  04/22/2013    Bacteremia assoc w L foot wound infection Mar 2015   . Gangrene of foot   . ESRD on hemodialysis   . Diabetes mellitus without complication     Type 2  . Diabetes mellitus with peripheral vascular disease     Medications:  See EMR  Assessment: Osteomyelitis on R heel, s/p I&D on 12/23. Today is day #3 vanc/zosyn. Blood cultures ngtd, wound culture showing GPC. Pt is ESRD, last rec'd vancomycin with dialysis yesterday, next dialysis expected to be Saturday.   Goal of Therapy:  Pre-HD vanc goal: 15-25  Plan:  -Ceftazidime 2 g IV qHD -Vancomycin 750 mg IV qHD -Will need dose of both on Saturday with HD then can continue with normal HD schedule -Pt should get ceftazidime prior to discharge today, as last zosyn given was this morning, with short half life, will not carry over until Saturday   Hughes Better, PharmD, BCPS Clinical Pharmacist Pager: [phone] 01/06/2014 11:48 AM

## 2014-01-06 NOTE — Progress Notes (Signed)
[facility] Progress Note  Assessment/Plan: 1. right heel ulcer with possible osteo - s/p for I & D; empiric Vanc and Zosyn; please clarify if IV antibiotics for d/c (even though ortho says resume prior po antibiotics)- can give empiric Vanc and Fortaz at dialysis - needs to keep dressing on x 2 weeks and float heel 2. ESRD - MWF - HD Wed - plan next HD SATURDAY 3. Hypertension/volume - leaving below edw lately gains modest 4. Anemia - Hgb has been > 12 off ESAs, noted 13.5 yesterday and 10.6 Wed and 9.8 today -resume Aranesp Saturday at outpt HD unit 5. Metabolic bone disease - Not on Hectorol - had been on 1 last month but stopped due to corr Ca ^ - ; continue binders; correct Ca better - resume hecotrol 1 at d/c 6. Nutrition - renal diet + supplements and vitamin;  7. Disp - d/c today  [name], PA-C [place] [phone] 01/06/2014,10:10 AM  LOS: 2 days   Subjective:   Happy to be going home today had pain in his arterial needle the last hour of HD and also in shoulder - happens at the outpt HD unit at times. No pain now. No nausea.  Objective Filed Vitals:   01/05/14 2010 01/05/14 2052 01/06/14 0510 01/06/14 0920  BP: 113/59 113/55 104/56 96/53  Pulse: 62 60 60 62  Temp: 97.3 F (36.3 C) 97.4 F (36.3 C) 98 F (36.7 C) 98 F (36.7 C)  TempSrc:  Oral Oral Oral  Resp: 15 16 16 16   Height:      Weight:  73.5 kg (162 lb 0.6 oz)    SpO2: 100% 100% 100% 93%   Physical Exam General:NAD Heart: RRR Lungs: no rales Abdomen: soft Extremities: left BKA no edema;right foot wrapped no LE edema Dialysis Access: left upper AVF  Dialysis Orders: [facility] MWF 180 400/800 4 hr 2 K 2 Ca profile 4 EDW 70 left upper AVF heparin 3500 no ESA, Fe or Hectorol  Recent labs: Hgb 12.4 12/16 iPTH 275    Additional Objective Labs: Basic Metabolic Panel:  Recent Labs Lab 01/04/14 1630 01/04/14 1925 01/05/14 0500 01/06/14 0441  NA 138  --   134* 133*  K 4.5  --  5.1 4.4  CL 92*  --  91* 96  CO2 27  --  27 25  GLUCOSE 139*  --  78 115*  BUN 48*  --  56* 30*  CREATININE 10.98* 11.17* 11.69* 7.86*  CALCIUM 9.8  --  8.7 8.4   Liver Function Tests:  Recent Labs Lab 01/05/14 0500 01/06/14 0441  AST 20 11  ALT 12 9  ALKPHOS 71 63  BILITOT 0.9 0.6  PROT 6.1 6.1  ALBUMIN 2.7* 2.6*  CBC:  Recent Labs Lab 01/04/14 1630 01/04/14 1925 01/05/14 0500 01/06/14 0441  WBC 9.9 10.5 6.6 6.2  NEUTROABS 7.9*  --  4.2 3.8  HGB 13.5 11.7* 10.6* 9.8*  HCT 41.8 37.5* 34.3* 32.3*  MCV 86.4 84.1 84.7 85.4  PLT 290 274 253 231   Blood Culture    Component Value Date/Time   SDES ULCER 01/04/2014 1633   SDES BLOOD RIGHT ARM 01/04/2014 1633   SDES BLOOD RIGHT HAND 01/04/2014 1633   SPECREQUEST Normal 01/04/2014 1633   SPECREQUEST BOTTLES DRAWN AEROBIC AND ANAEROBIC 5CC 01/04/2014 1633   SPECREQUEST BOTTLES DRAWN AEROBIC ONLY 3CC 01/04/2014 1633   CULT  01/04/2014 1633    Culture reincubated for better growth Performed  at [facility]  01/04/2014 1633           BLOOD CULTURE RECEIVED NO GROWTH TO DATE CULTURE WILL BE HELD FOR 5 DAYS BEFORE ISSUING A FINAL NEGATIVE REPORT Performed at [facility]  01/04/2014 1633           BLOOD CULTURE RECEIVED NO GROWTH TO DATE CULTURE WILL BE HELD FOR 5 DAYS BEFORE ISSUING A FINAL NEGATIVE REPORT Performed at [facility] PENDING 01/04/2014 1633   REPTSTATUS PENDING 01/04/2014 1633   REPTSTATUS PENDING 01/04/2014 1633   CBG:  Recent Labs Lab 01/05/14 1945 01/05/14 2045 01/05/14 2358 01/06/14 0505 01/06/14 0822  GLUCAP 96 92 156* 96 91   IStudies/Results: Dg Foot 2 Views Right  01/04/2014   CLINICAL DATA:  Right foot ulcer lateral calcaneus, possible osteomyelitis  EXAM: RIGHT FOOT - 2 VIEW  COMPARISON:  None.  FINDINGS: Two views of the right foot submitted. No acute fracture or subluxation. The patient is status post  amputation of distal metatarsals and toes. Extensive atherosclerotic vascular calcifications. No definite bone destruction to suggest osteomyelitis.  IMPRESSION: No acute fracture or subluxation. Status post amputation of distal metatarsals and toes. No definite bone destruction to suggest osteomyelitis   Electronically Signed   By: Lahoma Crocker M.D.   On: 01/04/2014 22:00   Medications: . sodium chloride 10 mL/hr at 01/05/14 1743   . aspirin EC  81 mg Oral Daily  . calcium acetate  1,334 mg Oral TID WC  . [START ON 01/10/2014] colchicine  0.6 mg Oral Once per day on Mon Thu  . feeding supplement (RESOURCE BREEZE)  1 Container Oral TID BM  . gabapentin  200 mg Oral QHS  . heparin  5,000 Units Subcutaneous 3 times per day  . insulin aspart  0-9 Units Subcutaneous 6 times per day  . lanthanum  1,000 mg Oral TID WC  . levothyroxine  175 mcg Oral QAC breakfast  . multivitamin  1 tablet Oral QHS  . piperacillin-tazobactam (ZOSYN)  IV  2.25 g Intravenous Q8H  . sodium chloride  3 mL Intravenous Q12H  . sodium chloride  3 mL Intravenous Q12H  . vancomycin  750 mg Intravenous Q M,W,F-HD

## 2014-01-06 NOTE — Progress Notes (Signed)
Pt. Discharged home with daughter, discharge instructions given. Follow up appointments discussed. Pt. Verbalized signs and symptoms of worsening conditions and when to call the doctor.   Penni Bombard, RN 01/06/2014 1:15 PM

## 2014-01-08 LAB — WOUND CULTURE
Gram Stain: NONE SEEN
Special Requests: NORMAL

## 2014-01-10 LAB — CULTURE, BLOOD (ROUTINE X 2)
Culture: NO GROWTH
Culture: NO GROWTH

## 2014-01-11 ENCOUNTER — Encounter: Payer: Self-pay | Admitting: Vascular Surgery

## 2014-01-11 ENCOUNTER — Ambulatory Visit: Payer: Medicare Other | Admitting: Physical Therapy

## 2014-01-11 ENCOUNTER — Encounter: Payer: Medicare Other | Admitting: Registered Nurse

## 2014-01-11 DIAGNOSIS — L97419 Non-pressure chronic ulcer of right heel and midfoot with unspecified severity: Secondary | ICD-10-CM | POA: Diagnosis not present

## 2014-01-11 DIAGNOSIS — E1151 Type 2 diabetes mellitus with diabetic peripheral angiopathy without gangrene: Secondary | ICD-10-CM | POA: Diagnosis not present

## 2014-01-11 DIAGNOSIS — E11621 Type 2 diabetes mellitus with foot ulcer: Secondary | ICD-10-CM | POA: Diagnosis present

## 2014-01-11 DIAGNOSIS — I10 Essential (primary) hypertension: Secondary | ICD-10-CM | POA: Diagnosis not present

## 2014-01-12 DIAGNOSIS — E11621 Type 2 diabetes mellitus with foot ulcer: Secondary | ICD-10-CM | POA: Diagnosis not present

## 2014-01-12 DIAGNOSIS — E1151 Type 2 diabetes mellitus with diabetic peripheral angiopathy without gangrene: Secondary | ICD-10-CM | POA: Diagnosis not present

## 2014-01-12 DIAGNOSIS — I10 Essential (primary) hypertension: Secondary | ICD-10-CM | POA: Diagnosis not present

## 2014-01-12 DIAGNOSIS — L97419 Non-pressure chronic ulcer of right heel and midfoot with unspecified severity: Secondary | ICD-10-CM | POA: Diagnosis not present

## 2014-01-12 LAB — GLUCOSE, CAPILLARY
GLUCOSE-CAPILLARY: 145 mg/dL — AB (ref 70–99)
Glucose-Capillary: 167 mg/dL — ABNORMAL HIGH (ref 70–99)
Glucose-Capillary: 146 mg/dL — ABNORMAL HIGH (ref 70–99)
Glucose-Capillary: 137 mg/dL — ABNORMAL HIGH (ref 70–99)

## 2014-01-13 ENCOUNTER — Encounter (HOSPITAL_COMMUNITY): Payer: Medicare Other

## 2014-01-13 ENCOUNTER — Encounter: Payer: Medicare Other | Admitting: Physical Therapy

## 2014-01-13 ENCOUNTER — Other Ambulatory Visit (HOSPITAL_COMMUNITY): Payer: Medicare Other

## 2014-01-13 DIAGNOSIS — L97419 Non-pressure chronic ulcer of right heel and midfoot with unspecified severity: Secondary | ICD-10-CM | POA: Diagnosis not present

## 2014-01-13 DIAGNOSIS — I10 Essential (primary) hypertension: Secondary | ICD-10-CM | POA: Diagnosis not present

## 2014-01-13 DIAGNOSIS — E11621 Type 2 diabetes mellitus with foot ulcer: Secondary | ICD-10-CM | POA: Diagnosis not present

## 2014-01-13 DIAGNOSIS — E1151 Type 2 diabetes mellitus with diabetic peripheral angiopathy without gangrene: Secondary | ICD-10-CM | POA: Diagnosis not present

## 2014-01-13 LAB — GLUCOSE, CAPILLARY
GLUCOSE-CAPILLARY: 161 mg/dL — AB (ref 70–99)
Glucose-Capillary: 119 mg/dL — ABNORMAL HIGH (ref 70–99)

## 2014-01-17 ENCOUNTER — Encounter (HOSPITAL_BASED_OUTPATIENT_CLINIC_OR_DEPARTMENT_OTHER): Payer: Medicare Other | Attending: General Surgery

## 2014-01-17 DIAGNOSIS — E11621 Type 2 diabetes mellitus with foot ulcer: Secondary | ICD-10-CM | POA: Diagnosis present

## 2014-01-17 DIAGNOSIS — L97414 Non-pressure chronic ulcer of right heel and midfoot with necrosis of bone: Secondary | ICD-10-CM | POA: Insufficient documentation

## 2014-01-18 ENCOUNTER — Encounter (HOSPITAL_COMMUNITY): Payer: Medicare Other

## 2014-01-18 ENCOUNTER — Ambulatory Visit: Payer: Medicare Other | Admitting: Vascular Surgery

## 2014-01-18 ENCOUNTER — Ambulatory Visit: Payer: Medicare Other | Attending: Orthopedic Surgery | Admitting: Physical Therapy

## 2014-01-18 ENCOUNTER — Other Ambulatory Visit (HOSPITAL_COMMUNITY): Payer: Medicare Other

## 2014-01-18 DIAGNOSIS — L97414 Non-pressure chronic ulcer of right heel and midfoot with necrosis of bone: Secondary | ICD-10-CM | POA: Diagnosis not present

## 2014-01-18 DIAGNOSIS — Z89512 Acquired absence of left leg below knee: Secondary | ICD-10-CM | POA: Insufficient documentation

## 2014-01-18 DIAGNOSIS — R6889 Other general symptoms and signs: Secondary | ICD-10-CM | POA: Insufficient documentation

## 2014-01-18 DIAGNOSIS — R269 Unspecified abnormalities of gait and mobility: Secondary | ICD-10-CM | POA: Insufficient documentation

## 2014-01-18 DIAGNOSIS — Z89431 Acquired absence of right foot: Secondary | ICD-10-CM | POA: Insufficient documentation

## 2014-01-18 DIAGNOSIS — R29898 Other symptoms and signs involving the musculoskeletal system: Secondary | ICD-10-CM | POA: Insufficient documentation

## 2014-01-18 DIAGNOSIS — E11621 Type 2 diabetes mellitus with foot ulcer: Secondary | ICD-10-CM | POA: Diagnosis not present

## 2014-01-18 LAB — GLUCOSE, CAPILLARY
GLUCOSE-CAPILLARY: 181 mg/dL — AB (ref 70–99)
Glucose-Capillary: 168 mg/dL — ABNORMAL HIGH (ref 70–99)

## 2014-01-19 LAB — GLUCOSE, CAPILLARY
Glucose-Capillary: 165 mg/dL — ABNORMAL HIGH (ref 70–99)
Glucose-Capillary: 194 mg/dL — ABNORMAL HIGH (ref 70–99)

## 2014-01-20 ENCOUNTER — Ambulatory Visit: Payer: Medicare Other | Admitting: Physical Therapy

## 2014-01-25 ENCOUNTER — Ambulatory Visit: Payer: Medicare Other | Admitting: Internal Medicine

## 2014-01-25 ENCOUNTER — Ambulatory Visit: Payer: Medicare Other | Admitting: Physical Therapy

## 2014-01-25 DIAGNOSIS — L97414 Non-pressure chronic ulcer of right heel and midfoot with necrosis of bone: Secondary | ICD-10-CM | POA: Diagnosis not present

## 2014-01-25 DIAGNOSIS — E11621 Type 2 diabetes mellitus with foot ulcer: Secondary | ICD-10-CM | POA: Diagnosis not present

## 2014-01-25 LAB — GLUCOSE, CAPILLARY
GLUCOSE-CAPILLARY: 121 mg/dL — AB (ref 70–99)
Glucose-Capillary: 150 mg/dL — ABNORMAL HIGH (ref 70–99)

## 2014-01-26 DIAGNOSIS — E11621 Type 2 diabetes mellitus with foot ulcer: Secondary | ICD-10-CM | POA: Diagnosis not present

## 2014-01-26 DIAGNOSIS — L97414 Non-pressure chronic ulcer of right heel and midfoot with necrosis of bone: Secondary | ICD-10-CM | POA: Diagnosis not present

## 2014-01-26 LAB — GLUCOSE, CAPILLARY
Glucose-Capillary: 139 mg/dL — ABNORMAL HIGH (ref 70–99)
Glucose-Capillary: 125 mg/dL — ABNORMAL HIGH (ref 70–99)
Glucose-Capillary: 189 mg/dL — ABNORMAL HIGH (ref 70–99)

## 2014-01-27 ENCOUNTER — Ambulatory Visit: Payer: Medicare Other | Admitting: Physical Therapy

## 2014-01-27 DIAGNOSIS — E11621 Type 2 diabetes mellitus with foot ulcer: Secondary | ICD-10-CM | POA: Diagnosis not present

## 2014-01-27 DIAGNOSIS — L97414 Non-pressure chronic ulcer of right heel and midfoot with necrosis of bone: Secondary | ICD-10-CM | POA: Diagnosis not present

## 2014-01-27 LAB — GLUCOSE, CAPILLARY
Glucose-Capillary: 135 mg/dL — ABNORMAL HIGH (ref 70–99)
Glucose-Capillary: 225 mg/dL — ABNORMAL HIGH (ref 70–99)

## 2014-01-31 DIAGNOSIS — L97414 Non-pressure chronic ulcer of right heel and midfoot with necrosis of bone: Secondary | ICD-10-CM | POA: Diagnosis not present

## 2014-01-31 DIAGNOSIS — E11621 Type 2 diabetes mellitus with foot ulcer: Secondary | ICD-10-CM | POA: Diagnosis not present

## 2014-01-31 LAB — GLUCOSE, CAPILLARY
GLUCOSE-CAPILLARY: 251 mg/dL — AB (ref 70–99)
GLUCOSE-CAPILLARY: 186 mg/dL — AB (ref 70–99)

## 2014-02-01 ENCOUNTER — Ambulatory Visit: Payer: Medicare Other | Admitting: Physical Therapy

## 2014-02-01 DIAGNOSIS — E11621 Type 2 diabetes mellitus with foot ulcer: Secondary | ICD-10-CM | POA: Diagnosis not present

## 2014-02-01 DIAGNOSIS — L97414 Non-pressure chronic ulcer of right heel and midfoot with necrosis of bone: Secondary | ICD-10-CM | POA: Diagnosis not present

## 2014-02-01 LAB — GLUCOSE, CAPILLARY
GLUCOSE-CAPILLARY: 278 mg/dL — AB (ref 70–99)
GLUCOSE-CAPILLARY: 179 mg/dL — AB (ref 70–99)

## 2014-02-01 NOTE — Therapy (Signed)
[name] [address] [place], [place], [address] Phone: [phone]   Fax:  [phone]  Patient Details  Name: Jack [name] MRN: [id number] Date of Birth: 1945-08-16 Referring Provider:  Lucianne Lei, MD  Encounter Date: 02/01/2014   PHYSICAL THERAPY DISCHARGE SUMMARY  Visits from Start of Care: 6  Current functional level related to goals / functional outcomes: Patient made limited progress due to wound issues with non-amputated foot. He was discharged due to hospitalization & change in medical condition. Please refer back to PT once he is medically stable.   Remaining deficits: Unknown due to unexpected discharge.   Education / Equipment: Patient was instructed in prosthetic care and verbalized a basic understanding. Plan: Patient agrees to discharge.  Patient goals were not met. Patient is being discharged due to a change in medical status.  ?????       Jamey Reas, PT, DPT PT Specializing in Prosthetics & Orthotics 02/01/14 8:40 AM Phone:  [phone]  Fax:  [phone] [facility] [address] [place], [place] [address]  Jamey Reas 02/01/2014, 8:37 AM  [facility] [address] [place], [place], [address] Phone: [phone]   Fax:  [phone]

## 2014-02-02 DIAGNOSIS — E11621 Type 2 diabetes mellitus with foot ulcer: Secondary | ICD-10-CM | POA: Diagnosis not present

## 2014-02-02 LAB — GLUCOSE, CAPILLARY
GLUCOSE-CAPILLARY: 116 mg/dL — AB (ref 70–99)
GLUCOSE-CAPILLARY: 208 mg/dL — AB (ref 70–99)

## 2014-02-03 ENCOUNTER — Encounter: Payer: Medicare Other | Admitting: Physical Therapy

## 2014-02-07 ENCOUNTER — Ambulatory Visit: Payer: Medicare Other | Admitting: Infectious Disease

## 2014-02-08 ENCOUNTER — Encounter: Payer: Medicare Other | Admitting: Physical Therapy

## 2014-02-08 DIAGNOSIS — E11621 Type 2 diabetes mellitus with foot ulcer: Secondary | ICD-10-CM | POA: Diagnosis not present

## 2014-02-08 DIAGNOSIS — L97414 Non-pressure chronic ulcer of right heel and midfoot with necrosis of bone: Secondary | ICD-10-CM | POA: Diagnosis not present

## 2014-02-08 LAB — GLUCOSE, CAPILLARY: GLUCOSE-CAPILLARY: 143 mg/dL — AB (ref 70–99)

## 2014-02-10 ENCOUNTER — Encounter: Payer: Medicare Other | Admitting: Physical Therapy

## 2014-02-10 DIAGNOSIS — L97414 Non-pressure chronic ulcer of right heel and midfoot with necrosis of bone: Secondary | ICD-10-CM | POA: Diagnosis not present

## 2014-02-10 DIAGNOSIS — E11621 Type 2 diabetes mellitus with foot ulcer: Secondary | ICD-10-CM | POA: Diagnosis not present

## 2014-02-10 LAB — GLUCOSE, CAPILLARY
Glucose-Capillary: 114 mg/dL — ABNORMAL HIGH (ref 70–99)
Glucose-Capillary: 104 mg/dL — ABNORMAL HIGH (ref 70–99)

## 2014-02-11 ENCOUNTER — Other Ambulatory Visit (HOSPITAL_COMMUNITY): Payer: Self-pay | Admitting: Orthopedic Surgery

## 2014-02-11 DIAGNOSIS — L97414 Non-pressure chronic ulcer of right heel and midfoot with necrosis of bone: Secondary | ICD-10-CM | POA: Diagnosis not present

## 2014-02-11 DIAGNOSIS — E11621 Type 2 diabetes mellitus with foot ulcer: Secondary | ICD-10-CM | POA: Diagnosis not present

## 2014-02-11 LAB — GLUCOSE, CAPILLARY
GLUCOSE-CAPILLARY: 133 mg/dL — AB (ref 70–99)
Glucose-Capillary: 247 mg/dL — ABNORMAL HIGH (ref 70–99)

## 2014-02-14 ENCOUNTER — Encounter (HOSPITAL_COMMUNITY): Payer: Medicare Other

## 2014-02-14 ENCOUNTER — Other Ambulatory Visit (HOSPITAL_COMMUNITY): Payer: Medicare Other

## 2014-02-14 ENCOUNTER — Encounter (HOSPITAL_BASED_OUTPATIENT_CLINIC_OR_DEPARTMENT_OTHER): Payer: Medicare Other | Attending: General Surgery

## 2014-02-17 ENCOUNTER — Encounter (HOSPITAL_COMMUNITY)
Admission: RE | Admit: 2014-02-17 | Discharge: 2014-02-17 | Disposition: A | Discharge status: Home / Self Care | Payer: Medicare Other | Source: Ambulatory Visit | Attending: Orthopedic Surgery | Admitting: Orthopedic Surgery

## 2014-02-17 ENCOUNTER — Encounter (HOSPITAL_COMMUNITY): Payer: Self-pay

## 2014-02-17 HISTORY — DX: Hypothyroidism, unspecified: E03.9

## 2014-02-17 LAB — COMPREHENSIVE METABOLIC PANEL
ALT: 16 U/L (ref 0–53)
AST: 15 U/L (ref 0–37)
Albumin: 3 g/dL — ABNORMAL LOW (ref 3.5–5.2)
Alkaline Phosphatase: 86 U/L (ref 39–117)
Anion gap: 10 (ref 5–15)
BUN: 20 mg/dL (ref 6–23)
CHLORIDE: 99 mmol/L (ref 96–112)
CO2: 30 mmol/L (ref 19–32)
Calcium: 9.5 mg/dL (ref 8.4–10.5)
Creatinine, Ser: 6.18 mg/dL — ABNORMAL HIGH (ref 0.50–1.35)
GFR calc Af Amer: 10 mL/min — ABNORMAL LOW (ref >90)
GFR calc non Af Amer: 8 mL/min — ABNORMAL LOW (ref >90)
Glucose, Bld: 189 mg/dL — ABNORMAL HIGH (ref 70–99)
Potassium: 4.4 mmol/L (ref 3.5–5.1)
Sodium: 139 mmol/L (ref 135–145)
TOTAL PROTEIN: 7.1 g/dL (ref 6.0–8.3)
Total Bilirubin: 0.5 mg/dL (ref 0.3–1.2)

## 2014-02-17 LAB — APTT: APTT: 33 s (ref 24–37)

## 2014-02-17 LAB — GLUCOSE, CAPILLARY: GLUCOSE-CAPILLARY: 160 mg/dL — AB (ref 70–99)

## 2014-02-17 LAB — PROTIME-INR
INR: 1.06 (ref 0.00–1.49)
PROTHROMBIN TIME: 13.9 s (ref 11.6–15.2)

## 2014-02-17 LAB — CBC
HCT: 35.3 % — ABNORMAL LOW (ref 39.0–52.0)
HEMOGLOBIN: 10.8 g/dL — AB (ref 13.0–17.0)
MCH: 26 pg (ref 26.0–34.0)
MCHC: 30.6 g/dL (ref 30.0–36.0)
MCV: 84.9 fL (ref 78.0–100.0)
Platelets: 307 10*3/uL (ref 150–400)
RBC: 4.16 MIL/uL — ABNORMAL LOW (ref 4.22–5.81)
RDW: 14.8 % (ref 11.5–15.5)
WBC: 9.1 10*3/uL (ref 4.0–10.5)

## 2014-02-17 MED ORDER — CEFAZOLIN SODIUM-DEXTROSE 2-3 GM-% IV SOLR
2.0000 g | INTRAVENOUS | Status: AC
  Administered 2014-02-18: 2 g via INTRAVENOUS
  Filled 2014-02-17: qty 50

## 2014-02-17 NOTE — Pre-Procedure Instructions (Signed)
Jack [name]  02/17/2014   Your procedure is scheduled on:  02/18/14  Report to [facility] Admitting at 1040 AM.  Call this number if you have problems the morning of surgery: [phone]   Remember:   Do not eat food or drink liquids after midnight.   Take these medicines the morning of surgery with A SIP OF WATER: colochine,neurontin,synthroid,oxycodone   Do not wear jewelry, make-up or nail polish.  Do not wear lotions, powders, or perfumes. You may wear deodorant.  Do not shave 48 hours prior to surgery. Men may shave face and neck.  Do not bring valuables to the hospital.  [facility] is not responsible                  for any belongings or valuables.               Contacts, dentures or bridgework may not be worn into surgery.  Leave suitcase in the car. After surgery it may be brought to your room.  For patients admitted to the hospital, discharge time is determined by your                treatment team.               Patients discharged the day of surgery will not be allowed to drive  home.  Name and phone number of your driver: family  Special Instructions: Incentive Spirometry - Practice and bring it with you on the day of surgery.   Please read over the following fact sheets that you were given: Pain Booklet, Coughing and Deep Breathing and Surgical Site Infection Prevention

## 2014-02-18 ENCOUNTER — Encounter (HOSPITAL_COMMUNITY)
Admission: RE | Disposition: A | Discharge status: Skilled Nursing Facility | Payer: Self-pay | Source: Ambulatory Visit | Attending: Orthopedic Surgery

## 2014-02-18 ENCOUNTER — Inpatient Hospital Stay (HOSPITAL_COMMUNITY)
Admission: RE | Admit: 2014-02-18 | Discharge: 2014-02-25 | DRG: 907 | Disposition: A | Discharge status: Skilled Nursing Facility | Payer: Medicare Other | Source: Ambulatory Visit | Attending: Orthopedic Surgery | Admitting: Orthopedic Surgery

## 2014-02-18 ENCOUNTER — Encounter (HOSPITAL_COMMUNITY): Payer: Self-pay | Admitting: Certified Registered"

## 2014-02-18 ENCOUNTER — Inpatient Hospital Stay (HOSPITAL_COMMUNITY): Payer: Medicare Other | Admitting: Anesthesiology

## 2014-02-18 DIAGNOSIS — J45909 Unspecified asthma, uncomplicated: Secondary | ICD-10-CM | POA: Diagnosis present

## 2014-02-18 DIAGNOSIS — Z87891 Personal history of nicotine dependence: Secondary | ICD-10-CM

## 2014-02-18 DIAGNOSIS — E1151 Type 2 diabetes mellitus with diabetic peripheral angiopathy without gangrene: Secondary | ICD-10-CM | POA: Diagnosis present

## 2014-02-18 DIAGNOSIS — C9000 Multiple myeloma not having achieved remission: Secondary | ICD-10-CM | POA: Diagnosis present

## 2014-02-18 DIAGNOSIS — E039 Hypothyroidism, unspecified: Secondary | ICD-10-CM | POA: Diagnosis present

## 2014-02-18 DIAGNOSIS — T8131XA Disruption of external operation (surgical) wound, not elsewhere classified, initial encounter: Principal | ICD-10-CM | POA: Diagnosis present

## 2014-02-18 DIAGNOSIS — I120 Hypertensive chronic kidney disease with stage 5 chronic kidney disease or end stage renal disease: Secondary | ICD-10-CM | POA: Diagnosis present

## 2014-02-18 DIAGNOSIS — M86671 Other chronic osteomyelitis, right ankle and foot: Secondary | ICD-10-CM | POA: Diagnosis present

## 2014-02-18 DIAGNOSIS — Z7982 Long term (current) use of aspirin: Secondary | ICD-10-CM | POA: Diagnosis not present

## 2014-02-18 DIAGNOSIS — Z888 Allergy status to other drugs, medicaments and biological substances status: Secondary | ICD-10-CM | POA: Diagnosis not present

## 2014-02-18 DIAGNOSIS — D631 Anemia in chronic kidney disease: Secondary | ICD-10-CM | POA: Diagnosis present

## 2014-02-18 DIAGNOSIS — Z91030 Bee allergy status: Secondary | ICD-10-CM

## 2014-02-18 DIAGNOSIS — Z89512 Acquired absence of left leg below knee: Secondary | ICD-10-CM | POA: Diagnosis not present

## 2014-02-18 DIAGNOSIS — Z992 Dependence on renal dialysis: Secondary | ICD-10-CM

## 2014-02-18 DIAGNOSIS — N186 End stage renal disease: Secondary | ICD-10-CM | POA: Diagnosis present

## 2014-02-18 DIAGNOSIS — Z885 Allergy status to narcotic agent status: Secondary | ICD-10-CM

## 2014-02-18 DIAGNOSIS — Z9842 Cataract extraction status, left eye: Secondary | ICD-10-CM

## 2014-02-18 DIAGNOSIS — N2581 Secondary hyperparathyroidism of renal origin: Secondary | ICD-10-CM | POA: Diagnosis present

## 2014-02-18 DIAGNOSIS — E1142 Type 2 diabetes mellitus with diabetic polyneuropathy: Secondary | ICD-10-CM | POA: Diagnosis present

## 2014-02-18 DIAGNOSIS — IMO0002 Reserved for concepts with insufficient information to code with codable children: Secondary | ICD-10-CM

## 2014-02-18 DIAGNOSIS — Z9841 Cataract extraction status, right eye: Secondary | ICD-10-CM | POA: Diagnosis not present

## 2014-02-18 DIAGNOSIS — L97419 Non-pressure chronic ulcer of right heel and midfoot with unspecified severity: Secondary | ICD-10-CM | POA: Diagnosis present

## 2014-02-18 HISTORY — PX: BELOW KNEE LEG AMPUTATION: SUR23

## 2014-02-18 HISTORY — PX: AMPUTATION: SHX166

## 2014-02-18 LAB — POCT I-STAT 4, (NA,K, GLUC, HGB,HCT)
GLUCOSE: 135 mg/dL — AB (ref 70–99)
HCT: 37 % — ABNORMAL LOW (ref 39.0–52.0)
HEMOGLOBIN: 12.6 g/dL — AB (ref 13.0–17.0)
Potassium: 3.7 mmol/L (ref 3.5–5.1)
SODIUM: 137 mmol/L (ref 135–145)

## 2014-02-18 LAB — GLUCOSE, CAPILLARY
GLUCOSE-CAPILLARY: 88 mg/dL (ref 70–99)
Glucose-Capillary: 100 mg/dL — ABNORMAL HIGH (ref 70–99)
Glucose-Capillary: 109 mg/dL — ABNORMAL HIGH (ref 70–99)

## 2014-02-18 LAB — HEMOGLOBIN A1C
Hgb A1c MFr Bld: 6.9 % — ABNORMAL HIGH (ref 4.8–5.6)
MEAN PLASMA GLUCOSE: 151 mg/dL

## 2014-02-18 SURGERY — AMPUTATION BELOW KNEE
Anesthesia: General | Site: Knee | Laterality: Right

## 2014-02-18 MED ORDER — RENA-VITE PO TABS
1.0000 | ORAL_TABLET | Freq: Every day | ORAL | Status: DC
  Administered 2014-02-18 – 2014-02-24 (×7): 1 via ORAL
  Filled 2014-02-18 (×9): qty 1

## 2014-02-18 MED ORDER — SODIUM CHLORIDE 0.9 % IV SOLN
INTRAVENOUS | Status: DC
  Administered 2014-02-18 – 2014-02-21 (×3): via INTRAVENOUS

## 2014-02-18 MED ORDER — ONDANSETRON HCL 4 MG PO TABS: 4.0000 mg | ORAL_TABLET | Freq: Four times a day (QID) | ORAL | Status: DC | PRN

## 2014-02-18 MED ORDER — COLCHICINE 0.6 MG PO TABS
0.6000 mg | ORAL_TABLET | ORAL | Status: DC
  Administered 2014-02-24: 0.6 mg via ORAL
  Filled 2014-02-18 (×4): qty 1

## 2014-02-18 MED ORDER — GABAPENTIN 100 MG PO CAPS
200.0000 mg | ORAL_CAPSULE | Freq: Every day | ORAL | Status: DC
  Administered 2014-02-18 – 2014-02-23 (×6): 200 mg via ORAL
  Filled 2014-02-18 (×8): qty 2

## 2014-02-18 MED ORDER — LIDOCAINE HCL (CARDIAC) 20 MG/ML IV SOLN
INTRAVENOUS | Status: DC | PRN
Start: 2014-02-18 — End: 2014-02-18
  Administered 2014-02-18: 60 mg via INTRAVENOUS

## 2014-02-18 MED ORDER — RENA-VITE PO TABS: 1.0000 | ORAL_TABLET | Freq: Every day | ORAL | Status: DC

## 2014-02-18 MED ORDER — ONDANSETRON HCL 4 MG/2ML IJ SOLN
INTRAMUSCULAR | Status: DC | PRN
  Administered 2014-02-18: 4 mg via INTRAVENOUS

## 2014-02-18 MED ORDER — METOCLOPRAMIDE HCL 5 MG/ML IJ SOLN: 5.0000 mg | Freq: Three times a day (TID) | INTRAMUSCULAR | Status: DC | PRN

## 2014-02-18 MED ORDER — SODIUM CHLORIDE 0.9 % IV SOLN
INTRAVENOUS | Status: DC
  Administered 2014-02-18: 10 mL/h via INTRAVENOUS

## 2014-02-18 MED ORDER — METHOCARBAMOL 500 MG PO TABS
500.0000 mg | ORAL_TABLET | Freq: Four times a day (QID) | ORAL | Status: DC | PRN
  Administered 2014-02-18 – 2014-02-23 (×8): 500 mg via ORAL
  Filled 2014-02-18 (×9): qty 1

## 2014-02-18 MED ORDER — OXYCODONE HCL 5 MG/5ML PO SOLN: 5.0000 mg | Freq: Once | ORAL | Status: AC | PRN

## 2014-02-18 MED ORDER — ASPIRIN EC 81 MG PO TBEC
81.0000 mg | DELAYED_RELEASE_TABLET | Freq: Every day | ORAL | Status: DC
  Administered 2014-02-19 – 2014-02-25 (×7): 81 mg via ORAL
  Filled 2014-02-18 (×7): qty 1

## 2014-02-18 MED ORDER — METOCLOPRAMIDE HCL 5 MG PO TABS: 5.0000 mg | ORAL_TABLET | Freq: Three times a day (TID) | ORAL | Status: DC | PRN

## 2014-02-18 MED ORDER — FENTANYL CITRATE 0.05 MG/ML IJ SOLN
INTRAMUSCULAR | Status: DC | PRN
  Administered 2014-02-18 (×2): 50 ug via INTRAVENOUS

## 2014-02-18 MED ORDER — HYDROMORPHONE HCL 1 MG/ML IJ SOLN
0.5000 mg | INTRAMUSCULAR | Status: DC | PRN
  Administered 2014-02-18: 1 mg via INTRAVENOUS
  Administered 2014-02-18: 0.5 mg via INTRAVENOUS
  Administered 2014-02-18 – 2014-02-23 (×10): 1 mg via INTRAVENOUS
  Administered 2014-02-23: 0.5 mg via INTRAVENOUS
  Administered 2014-02-23 – 2014-02-25 (×4): 1 mg via INTRAVENOUS
  Filled 2014-02-18 (×13): qty 1

## 2014-02-18 MED ORDER — LEVOTHYROXINE SODIUM 175 MCG PO TABS
175.0000 ug | ORAL_TABLET | Freq: Every day | ORAL | Status: DC
  Administered 2014-02-19 – 2014-02-24 (×6): 175 ug via ORAL
  Filled 2014-02-18 (×10): qty 1

## 2014-02-18 MED ORDER — SODIUM CHLORIDE 0.9 % IV SOLN
125.0000 mg | INTRAVENOUS | Status: AC
  Administered 2014-02-21 – 2014-02-25 (×3): 125 mg via INTRAVENOUS
  Filled 2014-02-18 (×6): qty 10

## 2014-02-18 MED ORDER — PROPOFOL 10 MG/ML IV BOLUS
INTRAVENOUS | Status: AC
  Filled 2014-02-18: qty 20

## 2014-02-18 MED ORDER — HYDROMORPHONE HCL 1 MG/ML IJ SOLN
INTRAMUSCULAR | Status: AC
  Administered 2014-02-18: 1 mg via INTRAVENOUS
  Filled 2014-02-18: qty 1

## 2014-02-18 MED ORDER — HYDROMORPHONE HCL 1 MG/ML IJ SOLN
INTRAMUSCULAR | Status: AC
  Administered 2014-02-18: 0.5 mg
  Filled 2014-02-18: qty 1

## 2014-02-18 MED ORDER — METHOCARBAMOL 1000 MG/10ML IJ SOLN
500.0000 mg | Freq: Four times a day (QID) | INTRAVENOUS | Status: DC | PRN
  Filled 2014-02-18: qty 5

## 2014-02-18 MED ORDER — HYDROMORPHONE HCL 1 MG/ML IJ SOLN
0.2500 mg | INTRAMUSCULAR | Status: DC | PRN
  Administered 2014-02-18 (×4): 0.5 mg via INTRAVENOUS

## 2014-02-18 MED ORDER — METHOCARBAMOL 500 MG PO TABS
ORAL_TABLET | ORAL | Status: AC
  Filled 2014-02-18: qty 1

## 2014-02-18 MED ORDER — LANTHANUM CARBONATE 500 MG PO CHEW
1000.0000 mg | CHEWABLE_TABLET | Freq: Three times a day (TID) | ORAL | Status: DC
  Administered 2014-02-19 – 2014-02-22 (×10): 1000 mg via ORAL
  Filled 2014-02-18 (×22): qty 2

## 2014-02-18 MED ORDER — OXYCODONE-ACETAMINOPHEN 5-325 MG PO TABS
1.0000 | ORAL_TABLET | ORAL | Status: DC | PRN
  Administered 2014-02-18 – 2014-02-20 (×5): 2 via ORAL
  Administered 2014-02-20: 1 via ORAL
  Administered 2014-02-20 – 2014-02-22 (×4): 2 via ORAL
  Administered 2014-02-22: 1 via ORAL
  Administered 2014-02-22 – 2014-02-23 (×4): 2 via ORAL
  Filled 2014-02-18 (×3): qty 2
  Filled 2014-02-18: qty 1
  Filled 2014-02-18 (×11): qty 2

## 2014-02-18 MED ORDER — HYDROMORPHONE HCL 1 MG/ML IJ SOLN
INTRAMUSCULAR | Status: AC
  Administered 2014-02-18: 0.5 mg via INTRAVENOUS
  Filled 2014-02-18: qty 1

## 2014-02-18 MED ORDER — PROPOFOL 10 MG/ML IV BOLUS
INTRAVENOUS | Status: DC | PRN
  Administered 2014-02-18: 200 mg via INTRAVENOUS

## 2014-02-18 MED ORDER — DOCUSATE SODIUM 100 MG PO CAPS
100.0000 mg | ORAL_CAPSULE | Freq: Two times a day (BID) | ORAL | Status: DC
  Administered 2014-02-18 – 2014-02-25 (×14): 100 mg via ORAL
  Filled 2014-02-18 (×16): qty 1

## 2014-02-18 MED ORDER — PHENYLEPHRINE HCL 10 MG/ML IJ SOLN
INTRAMUSCULAR | Status: DC | PRN
  Administered 2014-02-18 (×2): 80 ug via INTRAVENOUS

## 2014-02-18 MED ORDER — ONDANSETRON HCL 4 MG/2ML IJ SOLN: 4.0000 mg | Freq: Four times a day (QID) | INTRAMUSCULAR | Status: DC | PRN

## 2014-02-18 MED ORDER — OXYCODONE HCL 5 MG PO TABS
5.0000 mg | ORAL_TABLET | Freq: Once | ORAL | Status: AC | PRN
  Administered 2014-02-18: 5 mg via ORAL

## 2014-02-18 MED ORDER — CALCIUM ACETATE 667 MG PO CAPS
1334.0000 mg | ORAL_CAPSULE | Freq: Three times a day (TID) | ORAL | Status: DC
  Administered 2014-02-19 – 2014-02-24 (×15): 1334 mg via ORAL
  Filled 2014-02-18 (×21): qty 2

## 2014-02-18 MED ORDER — FENTANYL CITRATE 0.05 MG/ML IJ SOLN
INTRAMUSCULAR | Status: AC
Start: 2014-02-18 — End: 2014-02-18
  Filled 2014-02-18: qty 5

## 2014-02-18 MED ORDER — OXYCODONE HCL 5 MG PO TABS
ORAL_TABLET | ORAL | Status: AC
  Filled 2014-02-18: qty 1

## 2014-02-18 MED ORDER — DOXERCALCIFEROL 4 MCG/2ML IV SOLN
1.0000 ug | INTRAVENOUS | Status: DC
  Administered 2014-02-21 – 2014-02-25 (×2): 1 ug via INTRAVENOUS
  Filled 2014-02-18 (×3): qty 2

## 2014-02-18 MED ORDER — BOOST / RESOURCE BREEZE PO LIQD
1.0000 | Freq: Three times a day (TID) | ORAL | Status: DC
  Administered 2014-02-18 – 2014-02-24 (×12): 1 via ORAL
  Administered 2014-02-25: 1 mL via ORAL

## 2014-02-18 MED ORDER — CALCIUM ACETATE 667 MG PO CAPS
1334.0000 mg | ORAL_CAPSULE | Freq: Three times a day (TID) | ORAL | Status: DC
  Administered 2014-02-19 (×2): 1334 mg via ORAL
  Filled 2014-02-18 (×3): qty 2

## 2014-02-18 MED ORDER — CEFAZOLIN SODIUM 1-5 GM-% IV SOLN
1.0000 g | Freq: Four times a day (QID) | INTRAVENOUS | Status: AC
  Administered 2014-02-18 – 2014-02-19 (×3): 1 g via INTRAVENOUS
  Filled 2014-02-18 (×3): qty 50

## 2014-02-18 MED ORDER — PROMETHAZINE HCL 25 MG PO TABS
12.5000 mg | ORAL_TABLET | Freq: Four times a day (QID) | ORAL | Status: DC | PRN
  Filled 2014-02-18: qty 1

## 2014-02-18 SURGICAL SUPPLY — 36 items
BLADE SAW RECIP 87.9 MT (BLADE) ×3 IMPLANT
BLADE SURG 21 STRL SS (BLADE) ×3 IMPLANT
BNDG COHESIVE 6X5 TAN STRL LF (GAUZE/BANDAGES/DRESSINGS) ×3 IMPLANT
BNDG GAUZE ELAST 4 BULKY (GAUZE/BANDAGES/DRESSINGS) ×6 IMPLANT
COVER SURGICAL LIGHT HANDLE (MISCELLANEOUS) ×3 IMPLANT
CUFF TOURNIQUET SINGLE 34IN LL (TOURNIQUET CUFF) IMPLANT
CUFF TOURNIQUET SINGLE 44IN (TOURNIQUET CUFF) IMPLANT
DRAPE EXTREMITY T 121X128X90 (DRAPE) ×3 IMPLANT
DRAPE PROXIMA HALF (DRAPES) ×6 IMPLANT
DRAPE U-SHAPE 47X51 STRL (DRAPES) ×3 IMPLANT
DRSG ADAPTIC 3X8 NADH LF (GAUZE/BANDAGES/DRESSINGS) ×3 IMPLANT
DRSG PAD ABDOMINAL 8X10 ST (GAUZE/BANDAGES/DRESSINGS) ×3 IMPLANT
DURAPREP 26ML APPLICATOR (WOUND CARE) ×3 IMPLANT
ELECT REM PT RETURN 9FT ADLT (ELECTROSURGICAL) ×3
ELECTRODE REM PT RTRN 9FT ADLT (ELECTROSURGICAL) ×1 IMPLANT
GAUZE SPONGE 4X4 12PLY STRL (GAUZE/BANDAGES/DRESSINGS) ×3 IMPLANT
GLOVE BIOGEL PI IND STRL 9 (GLOVE) ×1 IMPLANT
GLOVE BIOGEL PI INDICATOR 9 (GLOVE) ×2
GLOVE SURG ORTHO 9.0 STRL STRW (GLOVE) ×3 IMPLANT
GOWN STRL REUS W/ TWL XL LVL3 (GOWN DISPOSABLE) ×2 IMPLANT
GOWN STRL REUS W/TWL XL LVL3 (GOWN DISPOSABLE) ×4
KIT BASIN OR (CUSTOM PROCEDURE TRAY) ×3 IMPLANT
KIT ROOM TURNOVER OR (KITS) ×3 IMPLANT
MANIFOLD NEPTUNE II (INSTRUMENTS) ×3 IMPLANT
NS IRRIG 1000ML POUR BTL (IV SOLUTION) ×3 IMPLANT
PACK GENERAL/GYN (CUSTOM PROCEDURE TRAY) ×3 IMPLANT
PAD ARMBOARD 7.5X6 YLW CONV (MISCELLANEOUS) ×6 IMPLANT
SPONGE LAP 18X18 X RAY DECT (DISPOSABLE) IMPLANT
STAPLER VISISTAT 35W (STAPLE) IMPLANT
STOCKINETTE IMPERVIOUS LG (DRAPES) ×3 IMPLANT
SUT SILK 2 0 (SUTURE) ×2
SUT SILK 2-0 18XBRD TIE 12 (SUTURE) ×1 IMPLANT
SUT VIC AB 1 CTX 27 (SUTURE) IMPLANT
TOWEL OR 17X24 6PK STRL BLUE (TOWEL DISPOSABLE) ×3 IMPLANT
TOWEL OR 17X26 10 PK STRL BLUE (TOWEL DISPOSABLE) ×3 IMPLANT
WATER STERILE IRR 1000ML POUR (IV SOLUTION) ×3 IMPLANT

## 2014-02-18 NOTE — Progress Notes (Signed)
Utilization review completed.  

## 2014-02-18 NOTE — Anesthesia Postprocedure Evaluation (Signed)
Anesthesia Post Note  Patient: Jack [name]  Procedure(s) Performed: Procedure(s) (LRB): AMPUTATION BELOW KNEE (Right)  Anesthesia type: General  Patient location: PACU  Post pain: Pain level controlled and Adequate analgesia  Post assessment: Post-op Vital signs reviewed, Patient's Cardiovascular Status Stable, Respiratory Function Stable, Patent Airway and Pain level controlled  Last Vitals:  Filed Vitals:   02/18/14 1400  BP: 114/68  Pulse: 70  Temp:   Resp: 18    Post vital signs: Reviewed and stable  Level of consciousness: awake, alert  and oriented  Complications: No apparent anesthesia complications

## 2014-02-18 NOTE — Anesthesia Preprocedure Evaluation (Signed)
Anesthesia Evaluation  Patient identified by MRN, date of birth, ID band Patient awake    Reviewed: Allergy & Precautions, NPO status , Patient's Chart, lab work & pertinent test results  Airway Mallampati: II   Neck ROM: full    Dental   Pulmonary asthma , former smoker,          Cardiovascular hypertension, + Peripheral Vascular Disease + dysrhythmias     Neuro/Psych  Neuromuscular disease    GI/Hepatic   Endo/Other  diabetes, Type 2Hypothyroidism   Renal/GU ESRF and DialysisRenal disease     Musculoskeletal   Abdominal   Peds  Hematology   Anesthesia Other Findings   Reproductive/Obstetrics                             Anesthesia Physical Anesthesia Plan  ASA: III  Anesthesia Plan: General   Post-op Pain Management:    Induction: Intravenous  Airway Management Planned: LMA  Additional Equipment:   Intra-op Plan:   Post-operative Plan:   Informed Consent: I have reviewed the patients History and Physical, chart, labs and discussed the procedure including the risks, benefits and alternatives for the proposed anesthesia with the patient or authorized representative who has indicated his/her understanding and acceptance.     Plan Discussed with: CRNA, Anesthesiologist and Surgeon  Anesthesia Plan Comments:         Anesthesia Quick Evaluation

## 2014-02-18 NOTE — Transfer of Care (Signed)
Immediate Anesthesia Transfer of Care Note  Patient: Jack [name]  Procedure(s) Performed: Procedure(s): AMPUTATION BELOW KNEE (Right)  Patient Location: PACU  Anesthesia Type:General  Level of Consciousness: awake, alert  and patient cooperative  Airway & Oxygen Therapy: Patient Spontanous Breathing  Post-op Assessment: Report given to RN and Post -op Vital signs reviewed and stable  Post vital signs: Reviewed and stable  Last Vitals:  Filed Vitals:   02/18/14 1230  BP: 118/41  Pulse: 66  Temp: 37.2 C  Resp: 20    Complications: No apparent anesthesia complications

## 2014-02-18 NOTE — Op Note (Signed)
   Date of Surgery: 02/18/2014  INDICATIONS: Jack [name] is a 69 y.o.-year-old male who has failed foot salvage intervention on the right he has a transtibial amputation on the left and presents at this time for a right transtibial amputation.  PREOPERATIVE DIAGNOSIS: Osteomyelitis right hindfoot  POSTOPERATIVE DIAGNOSIS: Same.  PROCEDURE: Transtibial amputation right  SURGEON: Sharol Given, M.D.  ANESTHESIA:  general  IV FLUIDS AND URINE: See anesthesia.  ESTIMATED BLOOD LOSS: Minimal mL.  COMPLICATIONS: None.  DESCRIPTION OF PROCEDURE: The patient was brought to the operating room and underwent a general anesthetic. After adequate levels of anesthesia were obtained patient's lower extremity was prepped using DuraPrep draped into a sterile field. A timeout was called.  A transverse incision was made 11 cm distal to the tibial tubercle. This curved proximally and a large posterior flap was created. The tibia was transected 1 cm proximal to the skin incision. The fibula was transected just proximal to the tibial incision. The tibia was beveled anteriorly. A large posterior flap was created. The sciatic nerve was pulled cut and allowed to retract. The vascular bundles were suture ligated with 2-0 silk. The deep and superficial fascial layers were closed using #1 Vicryl. The skin was closed using staples and 2-0 nylon. The wound was covered with Adaptic orthopedic sponges AB dressing Kerlix and Coban. Patient was extubated taken to the PACU in stable condition.  Jack Score, MD [place] 5:53 PM

## 2014-02-18 NOTE — Progress Notes (Signed)
Pt admitted to room 6e15 from PACU, pt alert and responsive.  VSS. O2 sat 100% RA.  Compression dressing to right BKA clean, dry, intact. Family at bedside.

## 2014-02-18 NOTE — H&P (Signed)
Jack [name] is an 69 y.o. male.   Chief Complaint: Osteomyelitis ulceration right foot HPI: Patient is a 69 year old gentleman who is status post foot salvage surgery who presents at this time for transtibial amputation due to progressive dehiscence of foot salvage intervention.  Past Medical History  Diagnosis Date  . Hypertension   . Pneumonia     2012  . Heart murmur   . Glaucoma   . Multiple myeloma, without mention of having achieved remission 03/30/2012    Cytogenetic neg on 03/23/2012.  . Asthma   . Hyperparathyroidism, secondary renal   . Peripheral arterial disease   . End-stage renal disease on hemodialysis     Started HD March 2014.  Cause of ESRD was DM.  Gets HD at [facility] on [address] on MWF schedule.   . Thyroid disease     hyperparathyroidism  . MRSA bacteremia   . Anemia   . Peripheral vascular disease, unspecified 11/19/2012    In the past had R foot toe amps then R TMA. In 2015 had left foot toe amp > then TMA >then L BKA on 05/14/13   . History of MRSA infection 04/22/2013    Bacteremia assoc w L foot wound infection Mar 2015   . Gangrene of foot   . ESRD on hemodialysis   . Diabetes mellitus without complication     Type 2  . Diabetes mellitus with peripheral vascular disease   . Hypothyroidism     Past Surgical History  Procedure Laterality Date  . Thyroidectomy    . Cervical disc surgery    . Insertion of dialysis catheter Right 03/19/2012    Procedure: INSERTION OF DIALYSIS CATHETER;  Surgeon: Mal Misty, MD;  Location: [place];  Service: Vascular;  Laterality: Right;  Right Internal Jugular  . Av fistula placement Left 03/25/2012    Procedure: ARTERIOVENOUS (AV) FISTULA CREATION;  Surgeon: Mal Misty, MD;  Location: [place];  Service: Vascular;  Laterality: Left;  . Ligation of competing branches of arteriovenous fistula Left 05/08/2012    Procedure: LIGATION OF COMPETING BRANCHES OF ARTERIOVENOUS FISTULA;  Surgeon: Mal Misty, MD;  Location:  [place];  Service: Vascular;  Laterality: Left;  Ultrasound guided  . Cardiac catheterization      approx 30 years ago  . Amputation Right 11/10/2012    Procedure: AMPUTATION FIRST and SECOND TOES Right Foot;  Surgeon: Elam Dutch, MD;  Location: [facility] OR;  Service: Vascular;  Laterality: Right;  . Transmetatarsal amputation Left 12/16/2012    Procedure: TRANSMETATARSAL AMPUTATION AND VAC PLACEMENT;  Surgeon: Elam Dutch, MD;  Location: [place];  Service: Vascular;  Laterality: Left;  . Amputation Left 04/07/2013    Procedure: AMPUTATION DIGIT- LEFT 1ST TOE;  Surgeon: Mal Misty, MD;  Location: [place];  Service: Vascular;  Laterality: Left;  . Tee without cardioversion N/A 04/20/2013    Procedure: TRANSESOPHAGEAL ECHOCARDIOGRAM (TEE);  Surgeon: Josue Hector, MD;  Location: [facility] ENDOSCOPY;  Service: Cardiovascular;  Laterality: N/A;  . I&d extremity Left 04/22/2013    Procedure: [id number] IHK DEBRIDEMENT LEFT FIRST TOE AMPUTATION WOUND ;  Surgeon: Mal Misty, MD;  Location: [place];  Service: Vascular;  Laterality: Left;  . Amputation Left 04/26/2013    Procedure: Left Foot Transmetatarsal Amputation;  Surgeon: Newt Minion, MD;  Location: [place];  Service: Orthopedics;  Laterality: Left;  . Toe amputation      D/C 04-30-13  . Below knee leg amputation  Left 05/14/2013    DR [name]  . Amputation Left 05/14/2013    Procedure: AMPUTATION BELOW KNEE;  Surgeon: Newt Minion, MD;  Location: [place];  Service: Orthopedics;  Laterality: Left;  Left Below Knee Amputation  . Eye surgery Bilateral     CATARACTS  . Amputation Left 07/23/2013    Procedure: AMPUTATION BELOW KNEE;  Surgeon: Newt Minion, MD;  Location: [place];  Service: Orthopedics;  Laterality: Left;  Left Below Knee Amputation Revision  . Abdominal aortagram Bilateral 11/06/2012    Procedure: ABDOMINAL AORTAGRAM;  Surgeon: Elam Dutch, MD;  Location: [facility] CATH LAB;  Service: Cardiovascular;  Laterality: Bilateral;  . I&d extremity Right  01/05/2014    Procedure: IRRIGATION AND DEBRIDEMENT Right Heel Ulcer;  Surgeon: Mcarthur Rossetti, MD;  Location: [place];  Service: Orthopedics;  Laterality: Right;  Surgeon Available after 5PM    Family History  Problem Relation Age of Onset  . Diabetes Mother   . Cancer Mother     bone   . Kidney disease Father    Social History:  reports that he quit smoking about 31 years ago. His smoking use included Cigarettes. He has never used smokeless tobacco. He reports that he does not drink alcohol or use illicit drugs.  Allergies:  Allergies  Allergen Reactions  . Bee Venom Anaphylaxis  . Lisinopril Cough  . Ivp Dye [Iodinated Diagnostic Agents] Hives  . Morphine And Related Other (See Comments)    Bradycardia states patient    No prescriptions prior to admission    Results for orders placed or performed during the hospital encounter of 02/17/14 (from the past 48 hour(s))  Glucose, capillary     Status: Abnormal   Collection Time: 02/17/14  3:28 PM  Result Value Ref Range   Glucose-Capillary 160 (H) 70 - 99 mg/dL  APTT     Status: None   Collection Time: 02/17/14  3:51 PM  Result Value Ref Range   aPTT 33 24 - 37 seconds  CBC     Status: Abnormal   Collection Time: 02/17/14  3:51 PM  Result Value Ref Range   WBC 9.1 4.0 - 10.5 K/uL   RBC 4.16 (L) 4.22 - 5.81 MIL/uL   Hemoglobin 10.8 (L) 13.0 - 17.0 g/dL   HCT 35.3 (L) 39.0 - 52.0 %   MCV 84.9 78.0 - 100.0 fL   MCH 26.0 26.0 - 34.0 pg   MCHC 30.6 30.0 - 36.0 g/dL   RDW 14.8 11.5 - 15.5 %   Platelets 307 150 - 400 K/uL  Comprehensive metabolic panel     Status: Abnormal   Collection Time: 02/17/14  3:51 PM  Result Value Ref Range   Sodium 139 135 - 145 mmol/L   Potassium 4.4 3.5 - 5.1 mmol/L   Chloride 99 96 - 112 mmol/L   CO2 30 19 - 32 mmol/L   Glucose, Bld 189 (H) 70 - 99 mg/dL   BUN 20 6 - 23 mg/dL   Creatinine, Ser 6.18 (H) 0.50 - 1.35 mg/dL   Calcium 9.5 8.4 - 10.5 mg/dL   Total Protein 7.1 6.0 - 8.3  g/dL   Albumin 3.0 (L) 3.5 - 5.2 g/dL   AST 15 0 - 37 U/L   ALT 16 0 - 53 U/L   Alkaline Phosphatase 86 39 - 117 U/L   Total Bilirubin 0.5 0.3 - 1.2 mg/dL   GFR calc non Af Amer 8 (L) >90 mL/min   GFR  calc Af Amer 10 (L) >90 mL/min    Comment: (NOTE) The eGFR has been calculated using the CKD EPI equation. This calculation has not been validated in all clinical situations. eGFR's persistently <90 mL/min signify possible Chronic Kidney Disease.    Anion gap 10 5 - 15  Protime-INR     Status: None   Collection Time: 02/17/14  3:51 PM  Result Value Ref Range   Prothrombin Time 13.9 11.6 - 15.2 seconds   INR 1.06 0.00 - 1.49  Hemoglobin A1c     Status: Abnormal   Collection Time: 02/17/14  3:51 PM  Result Value Ref Range   Hgb A1c MFr Bld 6.9 (H) 4.8 - 5.6 %    Comment: (NOTE)         Pre-diabetes: 5.7 - 6.4         Diabetes: >6.4         Glycemic control for adults with diabetes: <7.0    Mean Plasma Glucose 151 mg/dL    Comment: (NOTE) Performed At: [facility] [place], [place] [id number] Lindon Romp MD [id number]    No results found.  Review of Systems  All other systems reviewed and are negative.   There were no vitals taken for this visit. Physical Exam  On examination patient has ulceration osteomyelitis wound dehiscence right foot. Assessment/Plan Assessment: Dehiscence osteomyelitis ulceration right foot.  Plan: We'll plan for a right transtibial amputation. Risks and benefits were discussed patient will need dialysis postoperatively and will require skilled nursing care postoperatively.  [name],MARCUS V 02/18/2014, 6:27 AM

## 2014-02-18 NOTE — Consult Note (Signed)
Indication for Consultation:  Management of ESRD/hemodialysis; anemia, hypertension/volume and secondary hyperparathyroidism  HPI: Jack [name] is a 69 y.o. male who was admitted for scheduled R BKA for nonhealing foot wound. BKA done this afternoon By Dr Sharol Given and pt seen in PACU. Receives HD MWF @ [place], had HD this AM before coming to hospital. He reports he has been feeling well but has had failed R foot salvage and had nonhealing wound with possible osteo, he was admitted in December for R heel ulcer had an I&d and was on antibiotics at that time. He had a previous L BKA. He is planning on inpt rehab before DC to SNF  Past Medical History  Diagnosis Date  . Hypertension   . Pneumonia     2012  . Heart murmur   . Glaucoma   . Multiple myeloma, without mention of having achieved remission 03/30/2012    Cytogenetic neg on 03/23/2012.  . Asthma   . Hyperparathyroidism, secondary renal   . Peripheral arterial disease   . End-stage renal disease on hemodialysis     Started HD March 2014.  Cause of ESRD was DM.  Gets HD at [facility] on [address] on MWF schedule.   . Thyroid disease     hyperparathyroidism  . MRSA bacteremia   . Anemia   . Peripheral vascular disease, unspecified 11/19/2012    In the past had R foot toe amps then R TMA. In 2015 had left foot toe amp > then TMA >then L BKA on 05/14/13   . History of MRSA infection 04/22/2013    Bacteremia assoc w L foot wound infection Mar 2015   . Gangrene of foot   . ESRD on hemodialysis   . Diabetes mellitus without complication     Type 2  . Diabetes mellitus with peripheral vascular disease   . Hypothyroidism    Past Surgical History  Procedure Laterality Date  . Thyroidectomy    . Cervical disc surgery    . Insertion of dialysis catheter Right 03/19/2012    Procedure: INSERTION OF DIALYSIS CATHETER;  Surgeon: Mal Misty, MD;  Location: [place];  Service: Vascular;  Laterality: Right;  Right Internal Jugular  . Av fistula  placement Left 03/25/2012    Procedure: ARTERIOVENOUS (AV) FISTULA CREATION;  Surgeon: Mal Misty, MD;  Location: [place];  Service: Vascular;  Laterality: Left;  . Ligation of competing branches of arteriovenous fistula Left 05/08/2012    Procedure: LIGATION OF COMPETING BRANCHES OF ARTERIOVENOUS FISTULA;  Surgeon: Mal Misty, MD;  Location: [place];  Service: Vascular;  Laterality: Left;  Ultrasound guided  . Cardiac catheterization      approx 30 years ago  . Amputation Right 11/10/2012    Procedure: AMPUTATION FIRST and SECOND TOES Right Foot;  Surgeon: Elam Dutch, MD;  Location: [facility] OR;  Service: Vascular;  Laterality: Right;  . Transmetatarsal amputation Left 12/16/2012    Procedure: TRANSMETATARSAL AMPUTATION AND VAC PLACEMENT;  Surgeon: Elam Dutch, MD;  Location: [place];  Service: Vascular;  Laterality: Left;  . Amputation Left 04/07/2013    Procedure: AMPUTATION DIGIT- LEFT 1ST TOE;  Surgeon: Mal Misty, MD;  Location: [place];  Service: Vascular;  Laterality: Left;  . Tee without cardioversion N/A 04/20/2013    Procedure: TRANSESOPHAGEAL ECHOCARDIOGRAM (TEE);  Surgeon: Josue Hector, MD;  Location: [place];  Service: Cardiovascular;  Laterality: N/A;  . I&d extremity Left 04/22/2013    Procedure: IRRIGATION AND DEBRIDEMENT  LEFT FIRST TOE AMPUTATION WOUND ;  Surgeon: Mal Misty, MD;  Location: [place];  Service: Vascular;  Laterality: Left;  . Amputation Left 04/26/2013    Procedure: Left Foot Transmetatarsal Amputation;  Surgeon: Newt Minion, MD;  Location: [place];  Service: Orthopedics;  Laterality: Left;  . Toe amputation      D/C 04-30-13  . Below knee leg amputation Left 05/14/2013    DR [name]  . Amputation Left 05/14/2013    Procedure: AMPUTATION BELOW KNEE;  Surgeon: Newt Minion, MD;  Location: [place];  Service: Orthopedics;  Laterality: Left;  Left Below Knee Amputation  . Eye surgery Bilateral     CATARACTS  . Amputation Left 07/23/2013    Procedure: AMPUTATION  BELOW KNEE;  Surgeon: Newt Minion, MD;  Location: [place];  Service: Orthopedics;  Laterality: Left;  Left Below Knee Amputation Revision  . Abdominal aortagram Bilateral 11/06/2012    Procedure: ABDOMINAL AORTAGRAM;  Surgeon: Elam Dutch, MD;  Location: [facility] CATH LAB;  Service: Cardiovascular;  Laterality: Bilateral;  . I&d extremity Right 01/05/2014    Procedure: IRRIGATION AND DEBRIDEMENT Right Heel Ulcer;  Surgeon: Mcarthur Rossetti, MD;  Location: [place];  Service: Orthopedics;  Laterality: Right;  Surgeon Available after 5PM   Family History  Problem Relation Age of Onset  . Diabetes Mother   . Cancer Mother     bone   . Kidney disease Father    Social History:  reports that he quit smoking about 31 years ago. His smoking use included Cigarettes. He has never used smokeless tobacco. He reports that he does not drink alcohol or use illicit drugs. Allergies  Allergen Reactions  . Bee Venom Anaphylaxis  . Lisinopril Cough  . Ivp Dye [Iodinated Diagnostic Agents] Hives  . Morphine And Related Hives and Other (See Comments)    Bradycardia states patient   Prior to Admission medications   Medication Sig Start Date End Date Taking? Authorizing Provider  aspirin EC 81 MG tablet Take 81 mg by mouth daily.   Yes Historical Provider, MD  b complex-vitamin c-folic acid (NEPHRO-VITE) 0.8 MG TABS tablet Take 1 tablet by mouth daily.   Yes Historical Provider, MD  calcium acetate (PHOSLO) 667 MG capsule Take 1,334 mg by mouth 3 (three) times daily with meals.   Yes Historical Provider, MD  cefTAZidime 2 g in dextrose 5 % 50 mL Inject 2 g into the vein every Saturday with hemodialysis. One dose per holiday schedule 01/08/14  Yes Ripudeep K Rai, MD  cefTAZidime 2 g in dextrose 5 % 50 mL Inject 2 g into the vein every Monday, Wednesday, and Friday with hemodialysis. X 2 weeks 01/10/14  Yes Ripudeep K Rai, MD  colchicine 0.6 MG tablet Take 1 tablet (0.6 mg total) by mouth 2 (two) times a  week. 01/10/14  Yes Ripudeep Krystal Eaton, MD  feeding supplement, RESOURCE BREEZE, (RESOURCE BREEZE) LIQD Take 1 Container by mouth 3 (three) times daily between meals.   Yes Historical Provider, MD  gabapentin (NEURONTIN) 100 MG capsule Take 2 capsules (200 mg total) by mouth at bedtime. 12/14/13  Yes Meredith Staggers, MD  lanthanum (FOSRENOL) 1000 MG chewable tablet Chew 1,000 mg by mouth 3 (three) times daily with meals.   Yes Historical Provider, MD  levothyroxine (SYNTHROID, LEVOTHROID) 175 MCG tablet Take 175 mcg by mouth daily before breakfast.   Yes Historical Provider, MD  multivitamin (RENA-VIT) TABS tablet Take 1 tablet by mouth at  bedtime.   Yes Historical Provider, MD  Oxycodone HCl 10 MG TABS Take 0.5-1 tablets (5-10 mg total) by mouth every 6 (six) hours as needed (for severe pain). 01/06/14  Yes Ripudeep Krystal Eaton, MD  promethazine (PHENERGAN) 12.5 MG tablet Take 1 tablet (12.5 mg total) by mouth every 6 (six) hours as needed for nausea or vomiting. 01/06/14  Yes Ripudeep Krystal Eaton, MD  Vancomycin (VANCOCIN) 750 MG/150ML SOLN Inject 150 mLs (750 mg total) into the vein every Monday, Wednesday, and Friday with hemodialysis. X 2 weeks 01/06/14  Yes Ripudeep Krystal Eaton, MD   Current Facility-Administered Medications  Medication Dose Route Frequency Provider Last Rate Last Dose  . 0.9 %  sodium chloride infusion   Intravenous Continuous Albertha Ghee, MD 10 mL/hr at 02/18/14 1159 10 mL/hr at 02/18/14 1159  . HYDROmorphone (DILAUDID) 1 MG/ML injection           . HYDROmorphone (DILAUDID) injection 0.25-0.5 mg  0.25-0.5 mg Intravenous Q5 min PRN Albertha Ghee, MD   0.5 mg at 02/18/14 1448  . methocarbamol (ROBAXIN) tablet 500 mg  500 mg Oral Q6H PRN Newt Minion, MD   500 mg at 02/18/14 1413   Or  . methocarbamol (ROBAXIN) 500 mg in dextrose 5 % 50 mL IVPB  500 mg Intravenous Q6H PRN Newt Minion, MD      . methocarbamol (ROBAXIN) 500 MG tablet           . ondansetron (ZOFRAN) injection 4 mg  4 mg  Intravenous Q6H PRN Albertha Ghee, MD      . oxyCODONE (Oxy IR/ROXICODONE) 5 MG immediate release tablet            Labs: Basic Metabolic Panel:  Recent Labs Lab 02/17/14 1551 02/18/14 1150  NA 139 137  K 4.4 3.7  CL 99  --   CO2 30  --   GLUCOSE 189* 135*  BUN 20  --   CREATININE 6.18*  --   CALCIUM 9.5  --    Liver Function Tests:  Recent Labs Lab 02/17/14 1551  AST 15  ALT 16  ALKPHOS 86  BILITOT 0.5  PROT 7.1  ALBUMIN 3.0*   No results for input(s): LIPASE, AMYLASE in the last 168 hours. No results for input(s): AMMONIA in the last 168 hours. CBC:  Recent Labs Lab 02/17/14 1551 02/18/14 1150  WBC 9.1  --   HGB 10.8* 12.6*  HCT 35.3* 37.0*  MCV 84.9  --   PLT 307  --    Cardiac Enzymes: No results for input(s): CKTOTAL, CKMB, CKMBINDEX, TROPONINI in the last 168 hours. CBG:  Recent Labs Lab 02/17/14 1528 02/18/14 1330  GLUCAP 160* 100*   Iron Studies: No results for input(s): IRON, TIBC, TRANSFERRIN, FERRITIN in the last 72 hours. Studies/Results: No results found.  Review of Systems: Gen: Denies any fever, chills/ reports feeling well HEENT: No visual complaints, No history of Retinopathy. Normal external appearance No Epistaxis or Sore throat. No sinusitis.   CV: Denies chest pain, angina, palpitations, syncope, orthopnea, PND, peripheral edema, and claudication. Resp: Denies dyspnea at rest, dyspnea with exercise, cough, sputum, wheezing, coughing up blood, and pleurisy. GI: Denies vomiting blood, jaundice, and fecal incontinence.   Denies dysphagia or odynophagia. GU : Denies urinary burning, blood in urine, urinary frequency, urinary hesitancy, nocturnal urination, and urinary incontinence.  No renal calculi. MS: Denies joint pain, limitation of movement, and swelling, stiffness, low back pain, extremity pain. Denies muscle weakness, cramps, atrophy.  No use of non steroidal antiinflammatory drugs. Derm: R foot nonhealing wound now s/p  BKA Psych: Denies depression, anxiety, memory loss, suicidal ideation, hallucinations, paranoia, and confusion. Heme: Denies bruising, bleeding, and enlarged lymph nodes. Neuro: No headache.  No diplopia. No dysarthria.  No dysphasia.  No history of CVA.  No Seizures. No paresthesias.  No weakness. Endocrine No DM.  No Thyroid disease.  No Adrenal disease.  Physical Exam: Filed Vitals:   02/18/14 1330 02/18/14 1400 02/18/14 1415 02/18/14 1430  BP: 101/71 114/68 136/64 130/75  Pulse: 83 70 74 71  Temp: 98.3 F (36.8 C)     TempSrc:      Resp: '16 18 15 18  ' Height:      Weight:      SpO2: 100% 99% 99% 100%     General: Well developed, well nourished, in no acute distress. Head: Normocephalic, atraumatic, sclera non-icteric, mucus membranes are moist Neck: Supple. JVD not elevated. Lungs: Clear bilaterally to auscultation without wheezes, rales, or rhonchi. Breathing is unlabored. Heart: RRR with S1 S2. No murmurs, rubs, or gallops appreciated. Abdomen: Soft, non-tender, non-distended with normoactive bowel sounds. No rebound/guarding. No obvious abdominal masses. M-S:  Strength and tone appear normal for age. Lower extremities: L BKA healed. R BKA post op dressing  Neuro: Alert and oriented X 3. Moves all extremities spontaneously. Psych:  Responds to questions appropriately with a normal affect. Dialysis Access: L AVF +b/t   [facility]  [place]  4 [facility]  67.5 kgs  2k/2ca  2000 heparin   400/800  hectorol 1 q HD   venofer 100 x5 - stop date 2/12  Assessment/Plan: 1.  R heel ulcer with osteomylitis- s/p BKA w Dr Sharol Given 2/5  2.  ESRD -  MWF [place], HD this am. Next HD Monday, K+ 3.7 3.  Hypertension/volume  -130/75, left 0.4 kgs under edw wends- will need lower wt post amputation. No BP meds 4.  Anemia  - hgb 12.6, no ESA, watch CBC post op. Cont Fe thru 2/12 5.  Metabolic bone disease -  Ca 9.5/10.3 corrected, cont phoslo and hectorol - last phos 4.9 and PTH 237- may need  nonCa binder if Ca remains above 10 6.  Nutrition - alb 3, renal vit. Renal diet when advanced   Shelle Iron, NP [facility] [phone] 02/18/2014, 2:54 PM   Renal Attending: He had hemodialysis at the kidney center this morning before the amputation.  We will monitor for his renal issues detailed above. Kaylie Ritter C

## 2014-02-19 LAB — GLUCOSE, CAPILLARY
GLUCOSE-CAPILLARY: 112 mg/dL — AB (ref 70–99)
GLUCOSE-CAPILLARY: 137 mg/dL — AB (ref 70–99)
Glucose-Capillary: 208 mg/dL — ABNORMAL HIGH (ref 70–99)
Glucose-Capillary: 155 mg/dL — ABNORMAL HIGH (ref 70–99)

## 2014-02-19 NOTE — Evaluation (Signed)
Physical Therapy Evaluation Patient Details Name: Jack [name] MRN: [id number] DOB: 1945/07/08 Today's Date: 02/19/2014   History of Present Illness  69 y.o.-year-old male who has failed foot salvage intervention on the right he has a transtibial amputation on the left and presents now s/p right transtibial amputation.  Clinical Impression  Patient highly motivated individual with deficits in mobility as indicated below. Will benefit from continued skilled PT to address deficits and maximize function. Will see as indicated and progress as tolerated. At this time feel patient would need CIR for further mobility and management training in setting of bilateral BKAs.      Follow Up Recommendations CIR;Supervision/Assistance - 24 hour    Equipment Recommendations  None recommended by PT    Recommendations for Other Services Rehab consult     Precautions / Restrictions Precautions Precautions: Fall Precaution Comments: double amputee Restrictions Weight Bearing Restrictions: Yes RLE Weight Bearing: Non weight bearing      Mobility  Bed Mobility Overal bed mobility: Needs Assistance Bed Mobility: Rolling;Sidelying to Sit Rolling: Min assist Sidelying to sit: Mod assist       General bed mobility comments: Assist to elevate to upright positioning at EOB  Transfers Overall transfer level: Needs assistance Equipment used: None Transfers: Lateral/Scoot Transfers          Lateral/Scoot Transfers: Min assist General transfer comment: increased time to perform, instability noted during transition  Ambulation/Gait                Stairs            Wheelchair Mobility    Modified Rankin (Stroke Patients Only)       Balance Overall balance assessment: Needs assistance Sitting-balance support:  (performed with and without UE support) Sitting balance-Leahy Scale: Fair (fair with UE support, poor without UE support) Sitting balance - Comments: cues to  manage BOS and positioing trunk over hips Postural control: Posterior lean                                   Pertinent Vitals/Pain Pain Assessment: 0-10 Pain Score: 8  Pain Descriptors / Indicators: Throbbing Pain Intervention(s): Limited activity within patient's tolerance;Monitored during session;Premedicated before session;Repositioned    Home Living Family/patient expects to be discharged to:: Unsure Living Arrangements: Children               Additional Comments: Pt recently transferred from [facility]. Pt working with OT / PT at SNF    Prior Function Level of Independence: Needs assistance   Gait / Transfers Assistance Needed: sit<>stand transfer to w/c only  ADL's / Homemaking Assistance Needed: requires (A) for bathing/ dressing  Comments: requires use of w/c since 4/13 amputation     Hand Dominance   Dominant Hand: Right    Extremity/Trunk Assessment   Upper Extremity Assessment: Overall WFL for tasks assessed           Lower Extremity Assessment:  (Bilateral BKA (new RLE, hx LLE))         Communication   Communication: No difficulties  Cognition Arousal/Alertness: Awake/alert Behavior During Therapy: WFL for tasks assessed/performed Overall Cognitive Status: Within Functional Limits for tasks assessed                      General Comments General comments (skin integrity, edema, etc.): educated on desensitization technqiues for new stump, educated patient on positioning and  perform ther-ex well.     Exercises Amputee Exercises Quad Sets: AROM;Right;10 reps Hip Flexion/Marching: AROM;Right;10 reps Knee Flexion: AROM;Right;10 reps Chair Push Up:  (performed x3 for pressure relief)      Assessment/Plan    PT Assessment Patient needs continued PT services  PT Diagnosis Acute pain   PT Problem List Decreased balance;Decreased mobility;Decreased coordination;Impaired sensation;Pain  PT Treatment Interventions  DME instruction;Functional mobility training;Therapeutic activities;Therapeutic exercise;Balance training;Patient/family education;Wheelchair mobility training;Manual techniques   PT Goals (Current goals can be found in the Care Plan section) Acute Rehab PT Goals Patient Stated Goal: to be able to manage independently  PT Goal Formulation: With patient Time For Goal Achievement: 02/26/14 Potential to Achieve Goals: Good    Frequency Min 3X/week   Barriers to discharge        Co-evaluation               End of Session   Activity Tolerance: Patient tolerated treatment well;Patient limited by pain Patient left: in chair;with call bell/phone within reach Nurse Communication: Mobility status         Time: 7782-4235 PT Time Calculation (min) (ACUTE ONLY): 27 min   Charges:   PT Evaluation $Initial PT Evaluation Tier I: 1 Procedure PT Treatments $Therapeutic Exercise: 8-22 mins   PT G CodesDuncan Dull March 11, 2014, 2:09 PM Alben Deeds, [place] DPT  [phone]

## 2014-02-19 NOTE — Progress Notes (Signed)
Patient ID: Jack [name], male   DOB: 1945/01/18, 69 y.o.   MRN: [id number] Postoperative day 1 right transtibial amputation. Patient and family would like to consider inpatient rehabilitation. Otherwise patient may require skilled nursing placement. Dialysis while inpatient.

## 2014-02-19 NOTE — Progress Notes (Signed)
Subjective:   Feels great but is having a lot of pain in BKA  Objective Filed Vitals:   02/18/14 1606 02/18/14 1815 02/18/14 2126 02/19/14 0438  BP: 143/59 101/57 116/59 146/70  Pulse: 64 65 59 64  Temp: 98.2 F (36.8 C) 98.1 F (36.7 C) 98.2 F (36.8 C) 98.6 F (37 C)  TempSrc: Oral Oral    Resp: 17 20 18 17   Height:      Weight:   65.7 kg (144 lb 13.5 oz)   SpO2: 100% 100% 100% 100%   Physical Exam General: alert and oriented. No acute distress.  Heart: RRR 2/6 systolic murmur  Lungs: CTA unlabored Abdomen: soft, nontender +BS  Extremities: L BKA healed. R BKA post op dressing  Dialysis Access:  L AVF +b/t  [facility]  [facility] 67.5 kgs 2k/2ca 2000 heparin 400/800  hectorol 1 q HD venofer 100 x5 - stop date 2/12  Assessment/Plan: 1. R heel ulcer with osteomylitis- s/p BKA w Dr Sharol Given 2/5 on Ancef  2. ESRD - MWF [place], next HD Monday. K+3.7 3. Hypertension/volume -146/70, left 0.4 kgs under edw wends- will need lower wt post amputation. No BP meds 4. Anemia - hgb 12.6, no ESA, watch CBC post op. Cont Fe thru 2/12 5. Metabolic bone disease - Ca 9.5/10.3 corrected, cont phoslo and hectorol - last phos 4.9 and PTH 237- may need nonCa binder if Ca remains above 10 6. Nutrition - alb 3, renal vit. Renal diet and supplements 7. dispo- inpt rehab or SNF  Shelle Iron, NP [place] [phone] 02/19/2014,9:56 AM  LOS: 1 day    Renal Panel: Stable.  Post Op.  Agree with above. Nixon Sparr C    Additional Objective Labs: Basic Metabolic Panel:  Recent Labs Lab 02/17/14 1551 02/18/14 1150  NA 139 137  K 4.4 3.7  CL 99  --   CO2 30  --   GLUCOSE 189* 135*  BUN 20  --   CREATININE 6.18*  --   CALCIUM 9.5  --    Liver Function Tests:  Recent Labs Lab 02/17/14 1551  AST 15  ALT 16  ALKPHOS 86  BILITOT 0.5  PROT 7.1  ALBUMIN 3.0*   No results for input(s): LIPASE, AMYLASE in the last 168  hours. CBC:  Recent Labs Lab 02/17/14 1551 02/18/14 1150  WBC 9.1  --   HGB 10.8* 12.6*  HCT 35.3* 37.0*  MCV 84.9  --   PLT 307  --    Blood Culture    Component Value Date/Time   SDES ULCER 01/04/2014 1633   SDES BLOOD RIGHT ARM 01/04/2014 1633   SDES BLOOD RIGHT HAND 01/04/2014 1633   SPECREQUEST Normal 01/04/2014 1633   SPECREQUEST BOTTLES DRAWN AEROBIC AND ANAEROBIC 5CC 01/04/2014 1633   SPECREQUEST BOTTLES DRAWN AEROBIC ONLY 3CC 01/04/2014 1633   CULT  01/04/2014 1633    ABUNDANT STAPHYLOCOCCUS AUREUS Note: RIFAMPIN AND GENTAMICIN SHOULD NOT BE USED AS SINGLE DRUGS FOR TREATMENT OF STAPH INFECTIONS. ABUNDANT GROUP B STREP(S.AGALACTIAE)ISOLATED Note: TESTING AGAINST S. AGALACTIAE NOT ROUTINELY PERFORMED DUE TO PREDICTABILITY OF AMP/PEN/VAN SUSCEPTIBILITY. Performed at [place]  01/04/2014 1633    NO GROWTH 5 DAYS Performed at [place]  01/04/2014 1633    NO GROWTH 5 DAYS Performed at [place] 01/08/2014 FINAL 01/04/2014 1633   REPTSTATUS 01/10/2014 FINAL 01/04/2014 1633   REPTSTATUS 01/10/2014  FINAL 01/04/2014 1633    Cardiac Enzymes: No results for input(s): CKTOTAL, CKMB, CKMBINDEX, TROPONINI in the last 168 hours. CBG:  Recent Labs Lab 02/17/14 1528 02/18/14 1330 02/18/14 1724 02/18/14 2123  GLUCAP 160* 100* 88 109*   Iron Studies: No results for input(s): IRON, TIBC, TRANSFERRIN, FERRITIN in the last 72 hours. @lablastinr3 @ Studies/Results: No results found. Medications: . sodium chloride 10 mL/hr at 02/18/14 1829   . aspirin EC  81 mg Oral Daily  . calcium acetate  1,334 mg Oral TID WC  . calcium acetate  1,334 mg Oral TID WC  . [START ON 02/21/2014] colchicine  0.6 mg Oral Once per day on Mon Thu  . docusate sodium  100 mg Oral BID  . [START ON 02/21/2014] doxercalciferol  1 mcg Intravenous Q M,W,F-HD  . feeding supplement (RESOURCE BREEZE)  1 Container Oral TID BM  . [START ON  02/21/2014] ferric gluconate (FERRLECIT/NULECIT) IV  125 mg Intravenous Q M,W,F-HD  . gabapentin  200 mg Oral QHS  . lanthanum  1,000 mg Oral TID WC  . levothyroxine  175 mcg Oral QAC breakfast  . multivitamin  1 tablet Oral QHS

## 2014-02-20 LAB — GLUCOSE, CAPILLARY
GLUCOSE-CAPILLARY: 152 mg/dL — AB (ref 70–99)
Glucose-Capillary: 96 mg/dL (ref 70–99)
Glucose-Capillary: 141 mg/dL — ABNORMAL HIGH (ref 70–99)
Glucose-Capillary: 121 mg/dL — ABNORMAL HIGH (ref 70–99)

## 2014-02-20 NOTE — Progress Notes (Signed)
Subjective:   Feeling well, sat in chair yesterday. Still having pain  Objective Filed Vitals:   02/19/14 1010 02/19/14 1800 02/19/14 2104 02/20/14 0534  BP: 135/59 128/64 135/73 120/64  Pulse: 56 70 61 60  Temp: 98.1 F (36.7 C) 98.5 F (36.9 C) 98.4 F (36.9 C) 98.4 F (36.9 C)  TempSrc: Oral Oral    Resp: 18 18 17 18   Height:      Weight:   66.679 kg (147 lb)   SpO2: 100% 100% 99% 99%   Physical Exam General: alert and oriented no acute distress.  Heart: RRR 2/6 systolic murmur  Lungs: CTA, unlabored  Abdomen: soft, nontender +BS  Extremities:L BKA healed. R BKA post op dressing Dialysis Access: L AVf +b/t   [facility]  4 hours 67.5 kgs 2k/2ca 2000 heparin 400/800  hectorol 1 q HD venofer 100 x5 - stop date 2/12  Assessment/Plan: 1. R heel ulcer with osteomylitis- s/p BKA w Dr Sharol Given 2/5 2. ESRD - MWF [place], next HD Monday. K+3.7 3. Hypertension/volume -120/64, left 0.4 kgs under edw wends- will need lower wt post amputation. No BP meds 4. Anemia - hgb 12.6, no ESA, watch CBC post op. Cont Fe thru 2/12 5. Metabolic bone disease - Ca 9.5/10.3 corrected, cont phoslo and hectorol - last phos 4.9 and PTH 237- may need nonCa binder if Ca remains above 10 6. Nutrition - alb 3, renal vit. Renal diet and supplements 7. dispo- inpt rehab or SNF  Shelle Iron, NP [place] [phone] 02/20/2014,10:16 AM  LOS: 2 days    Renal Attending: Stable post op with some pain.  Will need lower EDW upon discharge and will continue HD MWF.   Leida Luton C      Additional Objective Labs: Basic Metabolic Panel:  Recent Labs Lab 02/17/14 1551 02/18/14 1150  NA 139 137  K 4.4 3.7  CL 99  --   CO2 30  --   GLUCOSE 189* 135*  BUN 20  --   CREATININE 6.18*  --   CALCIUM 9.5  --    Liver Function Tests:  Recent Labs Lab 02/17/14 1551  AST 15  ALT 16  ALKPHOS 86  BILITOT 0.5  PROT 7.1  ALBUMIN 3.0*   No results  for input(s): LIPASE, AMYLASE in the last 168 hours. CBC:  Recent Labs Lab 02/17/14 1551 02/18/14 1150  WBC 9.1  --   HGB 10.8* 12.6*  HCT 35.3* 37.0*  MCV 84.9  --   PLT 307  --    Blood Culture    Component Value Date/Time   SDES ULCER 01/04/2014 1633   SDES BLOOD RIGHT ARM 01/04/2014 1633   SDES BLOOD RIGHT HAND 01/04/2014 1633   SPECREQUEST Normal 01/04/2014 1633   SPECREQUEST BOTTLES DRAWN AEROBIC AND ANAEROBIC 5CC 01/04/2014 1633   SPECREQUEST BOTTLES DRAWN AEROBIC ONLY 3CC 01/04/2014 1633   CULT  01/04/2014 1633    ABUNDANT STAPHYLOCOCCUS AUREUS Note: RIFAMPIN AND GENTAMICIN SHOULD NOT BE USED AS SINGLE DRUGS FOR TREATMENT OF STAPH INFECTIONS. ABUNDANT GROUP B STREP(S.AGALACTIAE)ISOLATED Note: TESTING AGAINST S. AGALACTIAE NOT ROUTINELY PERFORMED DUE TO PREDICTABILITY OF AMP/PEN/VAN SUSCEPTIBILITY. Performed at [place]  01/04/2014 1633    NO GROWTH 5 DAYS Performed at [place]  01/04/2014 1633    NO GROWTH 5 DAYS Performed at [place] 01/08/2014 FINAL 01/04/2014 1633   REPTSTATUS 01/10/2014 FINAL  01/04/2014 1633   REPTSTATUS 01/10/2014 FINAL 01/04/2014 1633    Cardiac Enzymes: No results for input(s): CKTOTAL, CKMB, CKMBINDEX, TROPONINI in the last 168 hours. CBG:  Recent Labs Lab 02/19/14 0752 02/19/14 1150 02/19/14 1722 02/19/14 2101 02/20/14 0758  GLUCAP 112* 137* 208* 155* 96   Iron Studies: No results for input(s): IRON, TIBC, TRANSFERRIN, FERRITIN in the last 72 hours. @lablastinr3 @ Studies/Results: No results found. Medications: . sodium chloride 10 mL/hr at 02/18/14 1829   . aspirin EC  81 mg Oral Daily  . calcium acetate  1,334 mg Oral TID WC  . [START ON 02/21/2014] colchicine  0.6 mg Oral Once per day on Mon Thu  . docusate sodium  100 mg Oral BID  . [START ON 02/21/2014] doxercalciferol  1 mcg Intravenous Q M,W,F-HD  . feeding supplement (RESOURCE BREEZE)  1 Container  Oral TID BM  . [START ON 02/21/2014] ferric gluconate (FERRLECIT/NULECIT) IV  125 mg Intravenous Q M,W,F-HD  . gabapentin  200 mg Oral QHS  . lanthanum  1,000 mg Oral TID WC  . levothyroxine  175 mcg Oral QAC breakfast  . multivitamin  1 tablet Oral QHS

## 2014-02-20 NOTE — Progress Notes (Signed)
Patient ID: Jack [name], male   DOB: [birth year], 69 y.o.   MRN: [id number] Patient making slow steady improvement. Therapy feels the patient would be a good candidate for inpatient rehabilitation.

## 2014-02-21 ENCOUNTER — Encounter (HOSPITAL_COMMUNITY): Payer: Self-pay | Admitting: Orthopedic Surgery

## 2014-02-21 DIAGNOSIS — Z89511 Acquired absence of right leg below knee: Secondary | ICD-10-CM

## 2014-02-21 LAB — CBC
HEMATOCRIT: 27.5 % — AB (ref 39.0–52.0)
HEMOGLOBIN: 8.5 g/dL — AB (ref 13.0–17.0)
MCH: 26 pg (ref 26.0–34.0)
MCHC: 30.9 g/dL (ref 30.0–36.0)
MCV: 84.1 fL (ref 78.0–100.0)
Platelets: 238 10*3/uL (ref 150–400)
RBC: 3.27 MIL/uL — AB (ref 4.22–5.81)
RDW: 14.8 % (ref 11.5–15.5)
WBC: 9.8 10*3/uL (ref 4.0–10.5)

## 2014-02-21 LAB — RENAL FUNCTION PANEL
ALBUMIN: 2.3 g/dL — AB (ref 3.5–5.2)
ANION GAP: 13 (ref 5–15)
BUN: 43 mg/dL — AB (ref 6–23)
CO2: 25 mmol/L (ref 19–32)
Calcium: 8.6 mg/dL (ref 8.4–10.5)
Chloride: 98 mmol/L (ref 96–112)
Creatinine, Ser: 8.37 mg/dL — ABNORMAL HIGH (ref 0.50–1.35)
GFR calc non Af Amer: 6 mL/min — ABNORMAL LOW (ref >90)
GFR, EST AFRICAN AMERICAN: 7 mL/min — AB (ref >90)
Glucose, Bld: 120 mg/dL — ABNORMAL HIGH (ref 70–99)
PHOSPHORUS: 4.1 mg/dL (ref 2.3–4.6)
POTASSIUM: 4.7 mmol/L (ref 3.5–5.1)
Sodium: 136 mmol/L (ref 135–145)

## 2014-02-21 LAB — HEPATITIS B SURFACE ANTIGEN: HEP B S AG: NEGATIVE

## 2014-02-21 MED ORDER — SODIUM CHLORIDE 0.9 % IV SOLN: 100.0000 mL | INTRAVENOUS | Status: DC | PRN

## 2014-02-21 MED ORDER — HEPARIN SODIUM (PORCINE) 1000 UNIT/ML DIALYSIS
2000.0000 [IU] | Freq: Once | INTRAMUSCULAR | Status: DC
Start: 2014-02-21 — End: 2014-02-21

## 2014-02-21 MED ORDER — LIDOCAINE-PRILOCAINE 2.5-2.5 % EX CREA
1.0000 "application " | TOPICAL_CREAM | CUTANEOUS | Status: DC | PRN
  Filled 2014-02-21: qty 5

## 2014-02-21 MED ORDER — ALTEPLASE 2 MG IJ SOLR
2.0000 mg | Freq: Once | INTRAMUSCULAR | Status: DC | PRN
  Filled 2014-02-21: qty 2

## 2014-02-21 MED ORDER — LIDOCAINE HCL (PF) 1 % IJ SOLN: 5.0000 mL | INTRAMUSCULAR | Status: DC | PRN

## 2014-02-21 MED ORDER — PENTAFLUOROPROP-TETRAFLUOROETH EX AERO: 1.0000 "application " | INHALATION_SPRAY | CUTANEOUS | Status: DC | PRN

## 2014-02-21 MED ORDER — DOXERCALCIFEROL 4 MCG/2ML IV SOLN
INTRAVENOUS | Status: AC
  Administered 2014-02-21: 14:00:00
  Filled 2014-02-21: qty 2

## 2014-02-21 MED ORDER — OXYCODONE-ACETAMINOPHEN 5-325 MG PO TABS
ORAL_TABLET | ORAL | Status: AC
  Administered 2014-02-21: 2
  Filled 2014-02-21: qty 2

## 2014-02-21 MED ORDER — HEPARIN SODIUM (PORCINE) 1000 UNIT/ML DIALYSIS: 1000.0000 [IU] | INTRAMUSCULAR | Status: DC | PRN

## 2014-02-21 MED ORDER — NEPRO/CARBSTEADY PO LIQD
237.0000 mL | ORAL | Status: DC | PRN
  Filled 2014-02-21: qty 237

## 2014-02-21 NOTE — Progress Notes (Signed)
Rehab Admissions Coordinator Note:  Patient was screened by Retta Diones for appropriateness for an Inpatient Acute Rehab Consult.  At this time, an inpatient rehab consult has been ordered and is pending completion.  We will follow up after consult is completed.  Jodell Cipro M 02/21/2014, 9:06 AM  I can be reached at [phone].

## 2014-02-21 NOTE — Care Management Note (Signed)
CARE MANAGEMENT NOTE 02/21/2014  Patient:  LOOMIS, ANACKER   Account Number:  [id number]  Date Initiated:  02/21/2014  Documentation initiated by:  Bernon Arviso  Subjective/Objective Assessment:   CM Following for progression and d/c planning.     Action/Plan:   02/21/14 Post op rt transtib. await PT/OT evals for possible CIR. CIR following   Anticipated DC Date:  02/23/2014   Anticipated DC Plan:  IP REHAB FACILITY         Choice offered to / List presented to:             Status of service:   Medicare Important Message given?  YES (If response is "NO", the following Medicare IM given date fields will be blank) Date Medicare IM given:  02/21/2014 Medicare IM given by:  Earon Rivest Date Additional Medicare IM given:   Additional Medicare IM given by:    Discharge Disposition:    Per UR Regulation:    If discussed at Long Length of Stay Meetings, dates discussed:    Comments:

## 2014-02-21 NOTE — Procedures (Signed)
I was present at this dialysis session, have reviewed the session itself and made  appropriate changes  [name] MD (pgr) [phone]    ([phone] 02/21/2014, 8:55 AM

## 2014-02-21 NOTE — Progress Notes (Signed)
Inpatient Diabetes Program Recommendations  AACE/ADA: New Consensus Statement on Inpatient Glycemic Control (2013)  Target Ranges:  Prepandial:   less than 140 mg/dL      Peak postprandial:   less than 180 mg/dL (1-2 hours)      Critically ill patients:  140 - 180 mg/dL   Results for BLAYN, WHETSELL (MRN [id number]) as of 02/21/2014 09:48  Ref. Range 02/20/2014 07:58 02/20/2014 12:01 02/20/2014 16:28 02/20/2014 21:00  Glucose-Capillary Latest Range: 70-99 mg/dL 96 152 (H) 141 (H) 121 (H)   Diabetes history: DM2 Outpatient Diabetes medications: None Current orders for Inpatient glycemic control: None  Inpatient Diabetes Program Recommendations Correction (SSI): While inpatient, may want to consider ordering CBGs with Novolog correction scale TID with meals.  Thanks, Barnie Alderman, RN, MSN, CCRN, CDE Diabetes Coordinator Inpatient Diabetes Program [phone] (Team Pager) [phone] (AP office) [phone] [facility] office)

## 2014-02-21 NOTE — Progress Notes (Signed)
OT Cancellation Note  Patient Details Name: Jack [name] MRN: [id number] DOB: Oct 19, 1945   Cancelled Treatment:    Reason Eval/Treat Not Completed: Patient at procedure or test/ unavailable. Pt in hemodialysis, will attempt eval tomorrow.  Almon Register [phone] 02/21/2014, 11:21 AM

## 2014-02-21 NOTE — Progress Notes (Signed)
PT Cancellation Note  Patient Details Name: Jack [name] MRN: [id number] DOB: 12/21/1945   Cancelled Treatment:    Reason Eval/Treat Not Completed: Patient at procedure or test/unavailable. Pt off unit at HD this morning. Will check back as schedule allows.   Rolinda Roan 02/21/2014, 11:23 AM   Rolinda Roan, PT, DPT Acute Rehabilitation Services Pager: [phone]

## 2014-02-21 NOTE — Consult Note (Signed)
Physical Medicine and Rehabilitation Consult Reason for Consult: Right BKA and history of left BKA Referring Physician: Dr. Sharol Given   HPI: Jack [name] is a 69 y.o. right handed male with history of end-stage renal disease with hemodialysis, hypertension, diabetes mellitus and peripheral neuropathy, and peripheral vascular disease with history of left BKA December 2014 . Patient lives with daughter and family with assistance as needed and was just beginning to use a left prosthesis. Presented 02/18/2014 with osteomyelitis alteration of the right foot no change with conservative care. Underwent right BKA 02/18/2014 per Dr. Sharol Given. Postoperative pain management. Hemodialysis ongoing as per renal services. Physical therapy evaluation completed with recommendations of physical medicine rehabilitation consult.   Review of Systems  Respiratory: Positive for cough.   Gastrointestinal: Positive for constipation.  Musculoskeletal: Positive for myalgias and joint pain.  All other systems reviewed and are negative.  Past Medical History  Diagnosis Date  . Hypertension   . Pneumonia     2012  . Heart murmur   . Glaucoma   . Multiple myeloma, without mention of having achieved remission 03/30/2012    Cytogenetic neg on 03/23/2012.  . Asthma   . Hyperparathyroidism, secondary renal   . Peripheral arterial disease   . End-stage renal disease on hemodialysis     Started HD March 2014.  Cause of ESRD was DM.  Gets HD at [facility] on [address] on MWF schedule.   . Thyroid disease     hyperparathyroidism  . MRSA bacteremia   . Anemia   . Peripheral vascular disease, unspecified 11/19/2012    In the past had R foot toe amps then R TMA. In 2015 had left foot toe amp > then TMA >then L BKA on 05/14/13   . History of MRSA infection 04/22/2013    Bacteremia assoc w L foot wound infection Mar 2015   . Gangrene of foot   . ESRD on hemodialysis   . Diabetes mellitus without complication     Type 2    . Diabetes mellitus with peripheral vascular disease   . Hypothyroidism    Past Surgical History  Procedure Laterality Date  . Thyroidectomy    . Cervical disc surgery    . Insertion of dialysis catheter Right 03/19/2012    Procedure: INSERTION OF DIALYSIS CATHETER;  Surgeon: Mal Misty, MD;  Location: [place];  Service: Vascular;  Laterality: Right;  Right Internal Jugular  . Av fistula placement Left 03/25/2012    Procedure: ARTERIOVENOUS (AV) FISTULA CREATION;  Surgeon: Mal Misty, MD;  Location: [place];  Service: Vascular;  Laterality: Left;  . Ligation of competing branches of arteriovenous fistula Left 05/08/2012    Procedure: LIGATION OF COMPETING BRANCHES OF ARTERIOVENOUS FISTULA;  Surgeon: Mal Misty, MD;  Location: [place];  Service: Vascular;  Laterality: Left;  Ultrasound guided  . Cardiac catheterization      approx 30 years ago  . Amputation Right 11/10/2012    Procedure: AMPUTATION FIRST and SECOND TOES Right Foot;  Surgeon: Elam Dutch, MD;  Location: [facility] OR;  Service: Vascular;  Laterality: Right;  . Transmetatarsal amputation Left 12/16/2012    Procedure: TRANSMETATARSAL AMPUTATION AND VAC PLACEMENT;  Surgeon: Elam Dutch, MD;  Location: [place];  Service: Vascular;  Laterality: Left;  . Amputation Left 04/07/2013    Procedure: AMPUTATION DIGIT- LEFT 1ST TOE;  Surgeon: Mal Misty, MD;  Location: [place];  Service: Vascular;  Laterality: Left;  .  Tee without cardioversion N/A 04/20/2013    Procedure: TRANSESOPHAGEAL ECHOCARDIOGRAM (TEE);  Surgeon: Josue Hector, MD;  Location: [facility] ENDOSCOPY;  Service: Cardiovascular;  Laterality: N/A;  . I&d extremity Left 04/22/2013    Procedure: YIFOYDXAJO INO DEBRIDEMENT LEFT FIRST TOE AMPUTATION WOUND ;  Surgeon: Mal Misty, MD;  Location: [place];  Service: Vascular;  Laterality: Left;  . Amputation Left 04/26/2013    Procedure: Left Foot Transmetatarsal Amputation;  Surgeon: Newt Minion, MD;  Location: [place];  Service:  Orthopedics;  Laterality: Left;  . Toe amputation      D/C 04-30-13  . Below knee leg amputation Left 05/14/2013    DR [name]  . Amputation Left 05/14/2013    Procedure: AMPUTATION BELOW KNEE;  Surgeon: Newt Minion, MD;  Location: [place];  Service: Orthopedics;  Laterality: Left;  Left Below Knee Amputation  . Eye surgery Bilateral     CATARACTS  . Amputation Left 07/23/2013    Procedure: AMPUTATION BELOW KNEE;  Surgeon: Newt Minion, MD;  Location: [place];  Service: Orthopedics;  Laterality: Left;  Left Below Knee Amputation Revision  . Abdominal aortagram Bilateral 11/06/2012    Procedure: ABDOMINAL AORTAGRAM;  Surgeon: Elam Dutch, MD;  Location: [facility] CATH LAB;  Service: Cardiovascular;  Laterality: Bilateral;  . I&d extremity Right 01/05/2014    Procedure: IRRIGATION AND DEBRIDEMENT Right Heel Ulcer;  Surgeon: Mcarthur Rossetti, MD;  Location: [place];  Service: Orthopedics;  Laterality: Right;  Surgeon Available after 5PM  . Below knee leg amputation Right 02/18/2014    dr duda   Family History  Problem Relation Age of Onset  . Diabetes Mother   . Cancer Mother     bone   . Kidney disease Father    Social History:  reports that he quit smoking about 31 years ago. His smoking use included Cigarettes. He has never used smokeless tobacco. He reports that he does not drink alcohol or use illicit drugs. Allergies:  Allergies  Allergen Reactions  . Bee Venom Anaphylaxis  . Lisinopril Cough  . Ivp Dye [Iodinated Diagnostic Agents] Hives  . Morphine And Related Hives and Other (See Comments)    Bradycardia states patient   Medications Prior to Admission  Medication Sig Dispense Refill  . aspirin EC 81 MG tablet Take 81 mg by mouth daily.    Marland Kitchen b complex-vitamin c-folic acid (NEPHRO-VITE) 0.8 MG TABS tablet Take 1 tablet by mouth daily.    . calcium acetate (PHOSLO) 667 MG capsule Take 1,334 mg by mouth 3 (three) times daily with meals.    . cefTAZidime 2 g in dextrose 5 % 50 mL  Inject 2 g into the vein every Saturday with hemodialysis. One dose per holiday schedule    . cefTAZidime 2 g in dextrose 5 % 50 mL Inject 2 g into the vein every Monday, Wednesday, and Friday with hemodialysis. X 2 weeks    . colchicine 0.6 MG tablet Take 1 tablet (0.6 mg total) by mouth 2 (two) times a week.    . feeding supplement, RESOURCE BREEZE, (RESOURCE BREEZE) LIQD Take 1 Container by mouth 3 (three) times daily between meals.    . gabapentin (NEURONTIN) 100 MG capsule Take 2 capsules (200 mg total) by mouth at bedtime. 60 capsule 3  . lanthanum (FOSRENOL) 1000 MG chewable tablet Chew 1,000 mg by mouth 3 (three) times daily with meals.    Marland Kitchen levothyroxine (SYNTHROID, LEVOTHROID) 175 MCG tablet Take 175 mcg by  mouth daily before breakfast.    . multivitamin (RENA-VIT) TABS tablet Take 1 tablet by mouth at bedtime.    . Oxycodone HCl 10 MG TABS Take 0.5-1 tablets (5-10 mg total) by mouth every 6 (six) hours as needed (for severe pain). 60 tablet 0  . promethazine (PHENERGAN) 12.5 MG tablet Take 1 tablet (12.5 mg total) by mouth every 6 (six) hours as needed for nausea or vomiting. 30 tablet 0  . Vancomycin (VANCOCIN) 750 MG/150ML SOLN Inject 150 mLs (750 mg total) into the vein every Monday, Wednesday, and Friday with hemodialysis. X 2 weeks      Home: Home Living Family/patient expects to be discharged to:: Unsure Living Arrangements: Children Additional Comments: Pt recently transferred from [facility] SNF. Pt working with OT / PT at SNF  Functional History: Prior Function Level of Independence: Needs assistance Gait / Transfers Assistance Needed: sit<>stand transfer to w/c only ADL's / Homemaking Assistance Needed: requires (A) for bathing/ dressing Comments: requires use of w/c since 4/13 amputation Functional Status:  Mobility: Bed Mobility Overal bed mobility: Needs Assistance Bed Mobility: Rolling, Sidelying to Sit Rolling: Min assist Sidelying to sit: Mod assist General  bed mobility comments: Assist to elevate to upright positioning at EOB Transfers Overall transfer level: Needs assistance Equipment used: None Transfers: Lateral/Scoot Transfers  Lateral/Scoot Transfers: Min assist General transfer comment: increased time to perform, instability noted during transition      ADL:    Cognition: Cognition Overall Cognitive Status: Within Functional Limits for tasks assessed Orientation Level: Oriented X4 Cognition Arousal/Alertness: Awake/alert Behavior During Therapy: WFL for tasks assessed/performed Overall Cognitive Status: Within Functional Limits for tasks assessed  Blood pressure 111/62, pulse 77, temperature 98.6 F (37 C), temperature source Oral, resp. rate 17, height '5\' 10"'  (1.778 m), weight 68.947 kg (152 lb), SpO2 100 %. Physical Exam  Constitutional: He is oriented to person, place, and time.  HENT:  Head: Normocephalic.  Eyes: EOM are normal.  Neck: Normal range of motion. Neck supple. No thyromegaly present.  Cardiovascular: Normal rate and regular rhythm.   Respiratory: Effort normal and breath sounds normal. No respiratory distress.  GI: Soft. Bowel sounds are normal. He exhibits no distension.  Neurological: He is alert and oriented to person, place, and time.  Skin:  Left BKA site is well-healed. Right BKA with Coban band dressing in place appropriately tender    Results for orders placed or performed during the hospital encounter of 02/18/14 (from the past 24 hour(s))  Glucose, capillary     Status: None   Collection Time: 02/20/14  7:58 AM  Result Value Ref Range   Glucose-Capillary 96 70 - 99 mg/dL   Comment 1 Notify RN    Comment 2 Documented in Chart   Glucose, capillary     Status: Abnormal   Collection Time: 02/20/14 12:01 PM  Result Value Ref Range   Glucose-Capillary 152 (H) 70 - 99 mg/dL   Comment 1 Notify RN    Comment 2 Documented in Chart   Glucose, capillary     Status: Abnormal   Collection Time:  02/20/14  4:28 PM  Result Value Ref Range   Glucose-Capillary 141 (H) 70 - 99 mg/dL  Glucose, capillary     Status: Abnormal   Collection Time: 02/20/14  9:00 PM  Result Value Ref Range   Glucose-Capillary 121 (H) 70 - 99 mg/dL   No results found.  Assessment/Plan: Diagnosis: right BKA, old left BKA 1. Does the need for close, 24  hr/day medical supervision in concert with the patient's rehab needs make it unreasonable for this patient to be served in a less intensive setting? Yes 2. Co-Morbidities requiring supervision/potential complications: ESRD, anemia, MGUS 3. Due to bladder management, bowel management, safety, skin/wound care, disease management, medication administration, pain management and patient education, does the patient require 24 hr/day rehab nursing? Yes 4. Does the patient require coordinated care of a physician, rehab nurse, PT (1-2 hrs/day, 5 days/week) and OT (1-2 hrs/day, 5 days/week) to address physical and functional deficits in the context of the above medical diagnosis(es)? Yes Addressing deficits in the following areas: balance, endurance, locomotion, strength, transferring, bowel/bladder control, bathing, dressing, feeding, grooming and toileting 5. Can the patient actively participate in an intensive therapy program of at least 3 hrs of therapy per day at least 5 days per week? Yes 6. The potential for patient to make measurable gains while on inpatient rehab is excellent 7. Anticipated functional outcomes upon discharge from inpatient rehab are modified independent  with PT, modified independent with OT, n/a with SLP. 8. Estimated rehab length of stay to reach the above functional goals is: 7-10 days 9. Does the patient have adequate social supports and living environment to accommodate these discharge functional goals? Yes 10. Anticipated D/C setting: Home 11. Anticipated post D/C treatments: HH therapy and Outpatient therapy 12. Overall Rehab/Functional  Prognosis: excellent  RECOMMENDATIONS: This patient's condition is appropriate for continued rehabilitative care in the following setting: CIR Patient has agreed to participate in recommended program. Yes Note that insurance prior authorization may be required for reimbursement for recommended care.  Comment: Rehab Admissions Coordinator to follow up.  Thanks,  Meredith Staggers, MD, Mellody Drown     02/21/2014

## 2014-02-21 NOTE — Progress Notes (Signed)
Rehab admissions - I met with pt in follow up to rehab consult to explain the possibility of inpatient rehab. Further questions were answered and informational brochures were given. Pt is interested in pursuing inpatient rehab. Pt shared some baseline information and wanted me to contact his daughter [name]. Pt currently lives with his two dtrs and his brother.   I then called and spoke with [name] and she is in support of pt coming to inpatient rehab. I shared further information with her about our rehab program. I explained to both pt and his dtr that we will need to get insurance authorization from [facility] for inpatient rehab.  I will now open his case with [facility] Medicare and keep the pt/family and medical team aware of any updates.  Please call me with any questions. Thanks.  Nanetta Batty, PT Rehabilitation Admissions Coordinator [phone]

## 2014-02-21 NOTE — Progress Notes (Signed)
Patient ID: Jack [name], male   DOB: 1945/09/29, 70 y.o.   MRN: [id number] Patient without complaints this morning. No complaints of pain. Plan for inpatient rehabilitation prior to discharge to home.

## 2014-02-21 NOTE — Progress Notes (Signed)
  [facility] Progress Note  Assessment/Plan: 1. R heel ulcer with osteomylitis- s/p BKA w Dr Sharol Given 2/5- plan for transfer to rehab 2. ESRD - MWF labs pending 3. Hypertension/volume -BP ok left 0.4 kgs under edw before surgery. No BP meds 4. Anemia - hgb 10.8 , no ESA yet, CBC pending. Cont Fe thru 2/12 5. Metabolic bone disease - cont phoslo and hectorol - last PTH 237- may need nonCa binder if Ca remains above 10, labs pending 6. Nutrition - alb 3, renal vit. Renal diet and supplements 7. Disp inpt rehab or SNF  [name], PA-C [facility] [phone] 02/21/2014,8:24 AM  LOS: 3 days   Pt seen, examined and agree w A/P as above. Doing well. On HD.  [name] MD pager [phone]    cell [phone] 02/21/2014, 8:54 AM    Subjective:    Plans to go to rehab  Objective Filed Vitals:   02/20/14 0945 02/20/14 1629 02/20/14 2102 02/21/14 0633  BP: 125/72 104/63 111/62 148/69  Pulse: 66 61 77 85  Temp: 98.6 F (37 C) 98.4 F (36.9 C) 98.6 F (37 C) 98.9 F (37.2 C)  TempSrc: Oral Oral    Resp: 18 18 17 18   Height:      Weight:   68.947 kg (152 lb)   SpO2: 98% 99% 100% 100%   Physical Exam on HG goal 2 L General: NAD Heart: RRR 2/6 murmur Lungs: no rales Abdomen: soft NT Extremities: bilateral BKA; right wrapped; left no edema Dialysis Access: left AVF Qb 400  [facility]  4 [facility] 67.5 kgs 2k/2ca 2000 heparin 400/800  hectorol 1 q HD venofer 100 x5 - stop date 2/12  Additional Objective Labs: Basic Metabolic Panel:  Recent Labs Lab 02/17/14 1551 02/18/14 1150  NA 139 137  K 4.4 3.7  CL 99  --   CO2 30  --   GLUCOSE 189* 135*  BUN 20  --   CREATININE 6.18*  --   CALCIUM 9.5  --    Liver Function Tests:  Recent Labs Lab 02/17/14 1551  AST 15  ALT 16  ALKPHOS 86  BILITOT 0.5  PROT 7.1  ALBUMIN 3.0*  CBC:  Recent Labs Lab 02/17/14 1551 02/18/14 1150  WBC 9.1  --   HGB  10.8* 12.6*  HCT 35.3* 37.0*  MCV 84.9  --   PLT 307  --    CBG:  Recent Labs Lab 02/19/14 2101 02/20/14 0758 02/20/14 1201 02/20/14 1628 02/20/14 2100  GLUCAP 155* 96 152* 141* 121*  Medications: . sodium chloride 10 mL/hr at 02/20/14 2312   . aspirin EC  81 mg Oral Daily  . calcium acetate  1,334 mg Oral TID WC  . colchicine  0.6 mg Oral Once per day on Mon Thu  . docusate sodium  100 mg Oral BID  . doxercalciferol  1 mcg Intravenous Q M,W,F-HD  . feeding supplement (RESOURCE BREEZE)  1 Container Oral TID BM  . ferric gluconate (FERRLECIT/NULECIT) IV  125 mg Intravenous Q M,W,F-HD  . gabapentin  200 mg Oral QHS  . lanthanum  1,000 mg Oral TID WC  . levothyroxine  175 mcg Oral QAC breakfast  . multivitamin  1 tablet Oral QHS

## 2014-02-22 ENCOUNTER — Ambulatory Visit: Payer: Medicare Other | Admitting: Vascular Surgery

## 2014-02-22 LAB — GLUCOSE, CAPILLARY
GLUCOSE-CAPILLARY: 104 mg/dL — AB (ref 70–99)
Glucose-Capillary: 132 mg/dL — ABNORMAL HIGH (ref 70–99)
Glucose-Capillary: 133 mg/dL — ABNORMAL HIGH (ref 70–99)

## 2014-02-22 NOTE — Evaluation (Signed)
Occupational Therapy Evaluation Patient Details Name: Jack [name] MRN: [id number] DOB: 21-Jul-1945 Today's Date: 02/22/2014    History of Present Illness 69 y.o.-year-old male who has failed foot salvage intervention on the right he has a transtibial amputation on the left and presents now s/p right transtibial amputation.   Clinical Impression   Pt presents as per above and currently demonstrates deficits in functional mobility/transfers, ADL's and IADL's. He should benefit from acute OT to address problems as listed below and CIR for Rehab to assist in maximizing independence for return home w/ family PRN assist when able.    Follow Up Recommendations  CIR;Supervision/Assistance - 24 hour    Equipment Recommendations  Other (comment) (Defer to next venue)    Recommendations for Other Services Rehab consult     Precautions / Restrictions Precautions Precautions: Fall Precaution Comments: double amputee Restrictions Weight Bearing Restrictions: Yes RLE Weight Bearing: Non weight bearing      Mobility Bed Mobility Overal bed mobility: Needs Assistance Bed Mobility: Rolling;Sidelying to Sit Rolling: Min assist Sidelying to sit: Mod assist       General bed mobility comments: Assist to elevate to upright positioning at EOB and steady assist in sitting as pt c/o dizzy in sitting  Transfers Overall transfer level: Needs assistance Equipment used:  (Sliding transfer) Transfers: Lateral/Scoot Transfers          Lateral/Scoot Transfers: Mod assist General transfer comment: increased time to perform, rest breaks x2, instability noted during transition    Balance Overall balance assessment: Needs assistance Sitting-balance support: Bilateral upper extremity supported Sitting balance-Leahy Scale: Fair Sitting balance - Comments: cues to manage BOS and positioing trunk over hips Postural control: Posterior lean;Left lateral lean                                   ADL Overall ADL's : Needs assistance/impaired Eating/Feeding: Set up;Bed level   Grooming: Wash/dry hands;Wash/dry face;Oral care;Set up;Sitting   Upper Body Bathing: Set up;Sitting   Lower Body Bathing: Maximal assistance;Bed level   Upper Body Dressing : Minimal assistance;Sitting   Lower Body Dressing: Maximal assistance;Bed level   Toilet Transfer: Moderate assistance;Requires drop arm;BSC;Cueing for safety;Cueing for sequencing (Simulated toilet transfer as pt transferred EOB to drop arm recliner, increased time and initial assist for sitting balance at EOB)           Functional mobility during ADLs: Moderate assistance;Maximal assistance (Sliding board transfer EOB to drop arm recliner chair) General ADL Comments: Pt currently requires increased assistance for ADL and self care tasks due to generalized weakness and deconditioning s/p R BKA (hx L BKA). Pt required assist for sitting balance at EOB for ~70min in preparation for transfer to recliner chair for ADL's/bathing. Pt participated in ADL retraining session for grooming and UB bathing as well today. Discussed Role of OT and recommendation for CIR, pt verbalized understanding.     Vision  Wears glasses at all times, no changes from baseline.                   Perception     Praxis      Pertinent Vitals/Pain  Pt reports pain in R LE did not rate. "Soreness"     Hand Dominance Right   Extremity/Trunk Assessment Upper Extremity Assessment Upper Extremity Assessment: LUE deficits/detail;Overall WFL for tasks assessed LUE Deficits / Details: LUE w/ Fistula, pt w/ noted generalized LUE weakness and atrophy  especially thenar emminence and median nerve distribution of LUE. Decreased gross and fince motor coordination noted however, given increased time, pt able to perform tasks during transfers etc... Pt is RHD. LUE Sensation: history of peripheral neuropathy LUE Coordination: decreased fine  motor;decreased gross motor   Lower Extremity Assessment Lower Extremity Assessment: Defer to PT evaluation (Bilateral BKA (new RLLE, hx LLE)       Communication Communication Communication: No difficulties   Cognition                           General Comments       Exercises       Shoulder Instructions      Home Living Family/patient expects to be discharged to:: Inpatient rehab Living Arrangements: Children                               Additional Comments: Pt recently transferred from [facility]. Pt working with OT / PT at SNF      Prior Functioning/Environment Level of Independence: Needs assistance    ADL's / Homemaking Assistance Needed: Prior to recent surgery, pt required (A) for bathing/ dressing   Comments: requires use of w/c since 4/13 amputation    OT Diagnosis: Generalized weakness;Acute pain   OT Problem List: Decreased strength;Decreased activity tolerance;Impaired balance (sitting and/or standing);Decreased knowledge of use of DME or AE;Pain;Impaired UE functional use   OT Treatment/Interventions: Self-care/ADL training;Therapeutic exercise;Energy conservation;DME and/or AE instruction;Patient/family education;Therapeutic activities;Balance training    OT Goals(Current goals can be found in the care plan section) Acute Rehab OT Goals Patient Stated Goal: Pt wants to go home and manage by himself w/ PRN daughter assist Time For Goal Achievement: 03/08/14 Potential to Achieve Goals: Good  OT Frequency: Min 2X/week   Barriers to D/C:            Co-evaluation              End of Session Equipment Utilized During Treatment: Oxygen Nurse Communication: Other (comment) (Pt sitting up bathing and grooming in chair)  Activity Tolerance: Patient tolerated treatment well Patient left: in chair;with call bell/phone within reach   Time: 0752-0817 OT Time Calculation (min): 25 min Charges:  OT General Charges $OT  Visit: 1 Procedure OT Evaluation $Initial OT Evaluation Tier I: 1 Procedure OT Treatments $Self Care/Home Management : 8-22 mins G-Codes:    Josephine Igo Dixon, OTR/L 02/22/2014, 9:11 AM

## 2014-02-22 NOTE — Progress Notes (Signed)
Physical Therapy Treatment Patient Details Name: Jack [name] MRN: [id number] DOB: [birth year] Today's Date: 02/22/2014    History of Present Illness 69 y.o.-year-old male who has failed foot salvage intervention on the right he has a transtibial amputation on the left and presents now s/p right transtibial amputation.    PT Comments    Pt very fatigued this session as he was up in the chair for a few hours this morning after OT. Pt was educated on residual limb positioning and exercise recommendations. Pt was able to participate in limited therapeutic exercise due to fatigue. Pt fell asleep multiple times throughout session. Will continue to follow and progress as able per POC.   Follow Up Recommendations  CIR;Supervision/Assistance - 24 hour     Equipment Recommendations  None recommended by PT    Recommendations for Other Services Rehab consult     Precautions / Restrictions Precautions Precautions: Fall Precaution Comments: double amputee Restrictions Weight Bearing Restrictions: Yes RLE Weight Bearing: Non weight bearing    Mobility  Bed Mobility               General bed mobility comments: Pt fatigued from being in chair earlier this morning. Declined OOB or EOB.   Transfers                    Ambulation/Gait                 Stairs            Wheelchair Mobility    Modified Rankin (Stroke Patients Only)       Balance                                    Cognition Arousal/Alertness: Awake/alert Behavior During Therapy: WFL for tasks assessed/performed Overall Cognitive Status: Within Functional Limits for tasks assessed                      Exercises General Exercises - Lower Extremity Quad Sets: 10 reps (5" hold) Hip ABduction/ADduction: 10 reps Straight Leg Raises: 10 reps    General Comments        Pertinent Vitals/Pain Pain Assessment: Faces Faces Pain Scale: Hurts little more Pain  Location: R residual limb Pain Intervention(s): Limited activity within patient's tolerance;Monitored during session;Repositioned    Home Living                      Prior Function            PT Goals (current goals can now be found in the care plan section) Acute Rehab PT Goals Patient Stated Goal: Pt wants to go home and manage by himself w/ PRN daughter assist PT Goal Formulation: With patient Time For Goal Achievement: 02/26/14 Potential to Achieve Goals: Good Progress towards PT goals: Progressing toward goals    Frequency  Min 3X/week    PT Plan Current plan remains appropriate    Co-evaluation             End of Session Equipment Utilized During Treatment: Oxygen Activity Tolerance: Patient limited by fatigue Patient left: in bed;with call bell/phone within reach;with bed alarm set     Time: 7867-6720 PT Time Calculation (min) (ACUTE ONLY): 15 min  Charges:  $Therapeutic Exercise: 8-22 mins  G Codes:      Rolinda Roan 02/22/2014, 1:14 PM  Rolinda Roan, PT, DPT Acute Rehabilitation Services Pager: [phone]

## 2014-02-22 NOTE — Clinical Documentation Improvement (Addendum)
  Dr. Sharol Given [facility],   ICD 10 requires greater specificity regarding below the knee amputations.  "A transverse incision was made 11 cm distal to the tibial tubercle" is noted in the dictated operative report.  Please clarify and document if the right transtibial amputation is:   - High   - Mid   - Low    Thank You, Erling Conte ,RN Clinical Documentation Specialist:  [phone] [facility] Information Management

## 2014-02-22 NOTE — Progress Notes (Signed)
[facility] Progress Note  Assessment/Plan: 1. s/p right BKA due to R heel ulcer with osteomylitis- Dr Sharol Given 2/5- plan for transfer to rehab 2. ESRD - MWF HD Wed  3. Hypertension/volume -BP ok left 0.4 kgs under edw before surgery. No BP meds; net UF 2.7 Monday with post HD weight of 64.4- about 3 kg below prior edw 4. Anemia - hgb 10.8 down to 8.5 post op - will give Aranesp 60 on 2/10 Cont Fe thru 2/12 5. Metabolic bone disease - cont phoslo/fosrenol and hectorol - last PTH 237- Ca/P ok 6. Nutrition - alb 2.3 but intake good, renal vit. Renal diet and supplements 7. Disp inpt rehab soon  [name], PA-C [place] 02/22/2014,8:57 AM  LOS: 4 days   Pt seen, examined and agree w A/P as above.  [name] MD pager [phone]    cell [phone] 02/22/2014, 1:13 PM    Subjective:   Still having pain in right leg; ate 100% breakfast; tells me system clotted on HD yesterday  Objective Filed Vitals:   02/21/14 1319 02/21/14 1757 02/21/14 2058 02/22/14 0614  BP: 89/60 119/56 104/60 122/61  Pulse: 88 70 80 64  Temp: 98.7 F (37.1 C) 98.4 F (36.9 C) 98.5 F (36.9 C) 98.6 F (37 C)  TempSrc: Oral Oral Oral Oral  Resp: 18 18 16 18   Height:      Weight: 64.6 kg (142 lb 6.7 oz)     SpO2: 100% 100% 91% 100%   Physical Exam General: NAD sitting in chair Heart: RRR 2/6 murmur Lungs:clear without rales Abdomen: soft NT + BS Extremities: bilateral BKA - right wrapped; left no edema Dialysis Access: left AVF + bruit  [facility]  4 hours 67.5 kgs 2k/2ca 2000 heparin 400/800  hectorol 1 q HD venofer 100 x5 - stop date 2  Additional Objective Labs: Basic Metabolic Panel:  Recent Labs Lab 02/17/14 1551 02/18/14 1150 02/21/14 0852  NA 139 137 136  K 4.4 3.7 4.7  CL 99  --  98  CO2 30  --  25  GLUCOSE 189* 135* 120*  BUN 20  --  43*  CREATININE 6.18*  --  8.37*  CALCIUM 9.5  --  8.6  PHOS   --   --  4.1   Liver Function Tests:  Recent Labs Lab 02/17/14 1551 02/21/14 0852  AST 15  --   ALT 16  --   ALKPHOS 86  --   BILITOT 0.5  --   PROT 7.1  --   ALBUMIN 3.0* 2.3*   CBC:  Recent Labs Lab 02/17/14 1551 02/18/14 1150 02/21/14 0852  WBC 9.1  --  9.8  HGB 10.8* 12.6* 8.5*  HCT 35.3* 37.0* 27.5*  MCV 84.9  --  84.1  PLT 307  --  238  CBG:  Recent Labs Lab 02/19/14 2101 02/20/14 0758 02/20/14 1201 02/20/14 1628 02/20/14 2100  GLUCAP 155* 96 152* 141* 121*  Medications:   . aspirin EC  81 mg Oral Daily  . calcium acetate  1,334 mg Oral TID WC  . colchicine  0.6 mg Oral Once per day on Mon Thu  . docusate sodium  100 mg Oral BID  . doxercalciferol  1 mcg Intravenous Q M,W,F-HD  . feeding supplement (RESOURCE BREEZE)  1 Container Oral TID BM  . ferric gluconate (FERRLECIT/NULECIT) IV  125 mg Intravenous Q M,W,F-HD  . gabapentin  200 mg Oral QHS  .  lanthanum  1,000 mg Oral TID WC  . levothyroxine  175 mcg Oral QAC breakfast  . multivitamin  1 tablet Oral QHS

## 2014-02-22 NOTE — Progress Notes (Signed)
Patient ID: Jack [name], male   DOB: Sep 30, 1945, 69 y.o.   MRN: [id number] Patient comfortable this morning. Awaiting insurance decision for placement in inpatient rehabilitation

## 2014-02-22 NOTE — Clinical Documentation Improvement (Signed)
  [facility],  "Anemia" documented in current medical record.  Known history of ESRD requiring dialysis.  Please clarify the type of anemia:   - Anemia of CKD   - Other Type of Anemia   - Unable to Clinically Determine   Thank You, Erling Conte ,RN Clinical Documentation Specialist:  [phone] [facility] Information Management

## 2014-02-23 LAB — CBC
HCT: 26.3 % — ABNORMAL LOW (ref 39.0–52.0)
Hemoglobin: 8 g/dL — ABNORMAL LOW (ref 13.0–17.0)
MCH: 26.6 pg (ref 26.0–34.0)
MCHC: 30.4 g/dL (ref 30.0–36.0)
MCV: 87.4 fL (ref 78.0–100.0)
Platelets: 252 10*3/uL (ref 150–400)
RBC: 3.01 MIL/uL — ABNORMAL LOW (ref 4.22–5.81)
RDW: 15 % (ref 11.5–15.5)
WBC: 8.5 10*3/uL (ref 4.0–10.5)

## 2014-02-23 LAB — RENAL FUNCTION PANEL
Albumin: 2.2 g/dL — ABNORMAL LOW (ref 3.5–5.2)
Anion gap: 11 (ref 5–15)
BUN: 40 mg/dL — ABNORMAL HIGH (ref 6–23)
CO2: 27 mmol/L (ref 19–32)
Calcium: 9.1 mg/dL (ref 8.4–10.5)
Chloride: 101 mmol/L (ref 96–112)
Creatinine, Ser: 7.26 mg/dL — ABNORMAL HIGH (ref 0.50–1.35)
GFR calc Af Amer: 8 mL/min — ABNORMAL LOW (ref >90)
GFR calc non Af Amer: 7 mL/min — ABNORMAL LOW (ref >90)
Glucose, Bld: 120 mg/dL — ABNORMAL HIGH (ref 70–99)
Phosphorus: 3.6 mg/dL (ref 2.3–4.6)
Potassium: 4.5 mmol/L (ref 3.5–5.1)
Sodium: 139 mmol/L (ref 135–145)

## 2014-02-23 LAB — GLUCOSE, CAPILLARY: Glucose-Capillary: 168 mg/dL — ABNORMAL HIGH (ref 70–99)

## 2014-02-23 MED ORDER — DOXERCALCIFEROL 4 MCG/2ML IV SOLN
INTRAVENOUS | Status: AC
  Administered 2014-02-23: 1 ug
  Filled 2014-02-23: qty 2

## 2014-02-23 MED ORDER — SODIUM CHLORIDE 0.9 % IV SOLN: 100.0000 mL | INTRAVENOUS | Status: DC | PRN

## 2014-02-23 MED ORDER — HEPARIN SODIUM (PORCINE) 1000 UNIT/ML DIALYSIS: 1000.0000 [IU] | INTRAMUSCULAR | Status: DC | PRN

## 2014-02-23 MED ORDER — ALTEPLASE 2 MG IJ SOLR
2.0000 mg | Freq: Once | INTRAMUSCULAR | Status: DC | PRN
  Filled 2014-02-23: qty 2

## 2014-02-23 MED ORDER — LIDOCAINE HCL (PF) 1 % IJ SOLN: 5.0000 mL | INTRAMUSCULAR | Status: DC | PRN

## 2014-02-23 MED ORDER — DARBEPOETIN ALFA 60 MCG/0.3ML IJ SOSY
PREFILLED_SYRINGE | INTRAMUSCULAR | Status: AC
  Administered 2014-02-23: 60 ug
  Filled 2014-02-23: qty 0.3

## 2014-02-23 MED ORDER — HYDROMORPHONE HCL 1 MG/ML IJ SOLN
INTRAMUSCULAR | Status: AC
  Filled 2014-02-23: qty 1

## 2014-02-23 MED ORDER — DARBEPOETIN ALFA 60 MCG/0.3ML IJ SOSY: 60.0000 ug | PREFILLED_SYRINGE | INTRAMUSCULAR | Status: DC

## 2014-02-23 MED ORDER — PENTAFLUOROPROP-TETRAFLUOROETH EX AERO: 1.0000 "application " | INHALATION_SPRAY | CUTANEOUS | Status: DC | PRN

## 2014-02-23 MED ORDER — NEPRO/CARBSTEADY PO LIQD
237.0000 mL | ORAL | Status: DC | PRN
  Filled 2014-02-23: qty 237

## 2014-02-23 MED ORDER — LIDOCAINE-PRILOCAINE 2.5-2.5 % EX CREA: 1.0000 "application " | TOPICAL_CREAM | CUTANEOUS | Status: DC | PRN

## 2014-02-23 NOTE — Progress Notes (Signed)
Patient ID: Jack [name], male   DOB: 1945-12-16, 69 y.o.   MRN: [id number] Inpatient rehabilitation was denied by insurance. Patient will need to either be discharged to home or discharge to outpatient rehabilitation.

## 2014-02-23 NOTE — Procedures (Signed)
I was present at this dialysis session, have reviewed the session itself and made  appropriate changes  [name] MD (pgr) [phone]    ([phone] 02/23/2014, 10:25 AM

## 2014-02-23 NOTE — Progress Notes (Signed)
Patient ID: Jack [name], male   DOB: 1945-12-24, 69 y.o.   MRN: [id number] Additional diagnosis: Anemia of chronic kidney disease.

## 2014-02-23 NOTE — Progress Notes (Signed)
Rehab admissions - I am following pt's case and received a denial for inpatient rehab from [facility] [employer] Medicare Rn representative shared that their medical director felt pt's needs could be met at a lower level of care (SNF). I attempted to meet with pt this am to share this update but pt was gone to dialysis. Pt's dtr [name] was in the room and I updated her. I explained that Lorriane Shire, Education officer, museum would now be following up with pt/family on their preferences for SNF.  I also called pt's other dtr [name] to share this update as well. Further questions were answered and [name] was disappointed in the determination. Support provided.  We will now sign off pt's case in light of insurance denial and recommend that SNF be pursued. I updated both Malachy Mood, case Freight forwarder and Lorriane Shire, Education officer, museum.  Thank you.  Nanetta Batty, PT Rehabilitation Admissions Coordinator [phone]

## 2014-02-23 NOTE — Care Management Note (Signed)
CARE MANAGEMENT NOTE 02/23/2014  Patient:  Jack [name], Jack [name]   Account Number:  [id number]  Date Initiated:  02/21/2014  Documentation initiated by:  Juventino Pavone  Subjective/Objective Assessment:   CM Following for progression and d/c planning.     Action/Plan:   02/21/14 Post op rt transtib. await PT/OT evals for possible CIR. CIR following  02/22/14 Per CIR rep, pt insurance has denied CIR. CSW working on short term SNF.   Anticipated DC Date:  02/24/2014   Anticipated DC Plan:  SKILLED NURSING FACILITY         Choice offered to / List presented to:             Status of service:  In process, will continue to follow Medicare Important Message given?  YES (If response is "NO", the following Medicare IM given date fields will be blank) Date Medicare IM given:  02/21/2014 Medicare IM given by:  Ebany Bowermaster Date Additional Medicare IM given:   Additional Medicare IM given by:    Discharge Disposition:    Per UR Regulation:    If discussed at Long Length of Stay Meetings, dates discussed:    Comments:

## 2014-02-23 NOTE — Progress Notes (Signed)
Jack [name] Progress Note  Assessment/Plan: 1. s/p right BKA due to R heel ulcer with osteomylitis- Dr Sharol Given 2/5- denied for inpt rehab - needs SNF 2. ESRD - MWF HD being initiated -labs pending  3. Hypertension/volume -BP ok left 0.4 kgs under edw before surgery. No BP meds; net UF 2.7 Monday with post HD weight of 64.4 (hoyer)- about 3 kg below prior edw; was not weighed with hoyer pre HD today; bed weight 68.5 - likely false high - BP low set goal for 1 L to start 4. Anemia of chronic disease down post op due to acute blood loss - hgb 10.8 down to 8.5 post op - will give Aranesp 60 on 2/10 Cont Fe thru 2/12- repeat CBC 2/10 pending; If < 7.5 will transfuse due to low BP 5. Metabolic bone disease - cont phoslo/fosrenol and hectorol - last PTH 237- Ca/P ok 6. Nutrition - alb 2.3 but intake good, renal vit. Renal diet and supplements 7. Disp prob SNF  [name], PA-C [place] [phone] 02/23/2014,9:08 AM  LOS: 5 days   Pt seen, examined and agree w A/P as above.  Jack Splinter MD pager [phone]    cell [phone] 02/23/2014, 10:25 AM    Subjective:   Leg still hurts, eating and drinking ok  Objective Filed Vitals:   02/22/14 1000 02/22/14 1800 02/22/14 2202 02/23/14 0445  BP: 116/71 110/65 104/48 100/56  Pulse: 92 105 71 63  Temp: 98.3 F (36.8 C) 98.7 F (37.1 C) 98.7 F (37.1 C) 98.4 F (36.9 C)  TempSrc: Oral Oral Oral Oral  Resp: 18 18 17 15   Height:   5\' 10"  (1.778 m)   Weight:   66.04 kg (145 lb 9.5 oz)   SpO2: 100% 100% 100% 100%   Physical Exam General: NAD on HD Heart: RRR 2/6 murmur Lungs: no rales Abdomen: soft NT Extremities: bilateral BKAs right wrapped; left no edema Dialysis Access: left AVF Qb 400  [facility]  4 hours 67.5 kgs 2k/2ca 2000 heparin 400/800  hectorol 1 q HD venofer 100 x5 -   Additional Objective Labs: Basic Metabolic Panel:  Recent Labs Lab  02/17/14 1551 02/18/14 1150 02/21/14 0852  NA 139 137 136  K 4.4 3.7 4.7  CL 99  --  98  CO2 30  --  25  GLUCOSE 189* 135* 120*  BUN 20  --  43*  CREATININE 6.18*  --  8.37*  CALCIUM 9.5  --  8.6  PHOS  --   --  4.1   Liver Function Tests:  Recent Labs Lab 02/17/14 1551 02/21/14 0852  AST 15  --   ALT 16  --   ALKPHOS 86  --   BILITOT 0.5  --   PROT 7.1  --   ALBUMIN 3.0* 2.3*   CBC:  Recent Labs Lab 02/17/14 1551 02/18/14 1150 02/21/14 0852  WBC 9.1  --  9.8  HGB 10.8* 12.6* 8.5*  HCT 35.3* 37.0* 27.5*  MCV 84.9  --  84.1  PLT 307  --  238   CBG:  Recent Labs Lab 02/20/14 1628 02/20/14 2100 02/21/14 0748 02/21/14 1320 02/21/14 1633  GLUCAP 141* 121* 104* 132* 133*  Medications:   . aspirin EC  81 mg Oral Daily  . calcium acetate  1,334 mg Oral TID WC  . colchicine  0.6 mg Oral Once per day on Mon Thu  . docusate sodium  100 mg Oral  BID  . doxercalciferol  1 mcg Intravenous Q M,W,F-HD  . feeding supplement (RESOURCE BREEZE)  1 Container Oral TID BM  . ferric gluconate (FERRLECIT/NULECIT) IV  125 mg Intravenous Q M,W,F-HD  . gabapentin  200 mg Oral QHS  . lanthanum  1,000 mg Oral TID WC  . levothyroxine  175 mcg Oral QAC breakfast  . multivitamin  1 tablet Oral QHS

## 2014-02-24 LAB — GLUCOSE, CAPILLARY: Glucose-Capillary: 153 mg/dL — ABNORMAL HIGH (ref 70–99)

## 2014-02-24 MED ORDER — PHENOL 1.4 % MT LIQD
1.0000 | OROMUCOSAL | Status: DC | PRN
  Administered 2014-02-24: 1 via OROMUCOSAL
  Filled 2014-02-24: qty 177

## 2014-02-24 NOTE — Clinical Social Work Note (Signed)
Bed search initiated and bed offers given. Daughter [name] chose [facility] and facility contacted. FL-2 faxed to Dr. Jess Barters office twice today for signature and contact made with MD's office. FL-2 placed in shadow chart for signature and follow-up will be made with doctor's office on 2/12.  Jack [name], MSW, LCSW Licensed Clinical Social Worker [place] [phone]

## 2014-02-24 NOTE — Clinical Social Work Psychosocial (Signed)
Clinical Social Work Department BRIEF PSYCHOSOCIAL ASSESSMENT 02/24/2014  Patient:  Jack [name], Jack [name]     Account Number:  [id number]     Admit date:  02/18/2014  Clinical Social Worker:  Gwenevere Abbot,  Date/Time:  02/24/2014 04:45 PM  Referred by:  Physician  Date Referred:   Referred for  SNF Placement   Other Referral:   Interview type:  Patient Other interview type:    PSYCHOSOCIAL DATA Living Status:  FAMILY Admitted from facility:   Level of care:   Primary support name:  Jack [name] Primary support relationship to patient:  CHILD, ADULT Degree of support available:   Jack [name]    CURRENT CONCERNS Current Concerns  Post-Acute Placement   Other Concerns:    SOCIAL WORK ASSESSMENT / PLAN CSW-Intern observed CSW speak with Jack [name] at bedside. CSW informed him that his insurance would not cover inpatient rehab and the second option would be rehab at a skilled nursing facility. Jack [name] requested CSW to speak with his daughter Jack [name] regarding rehab facilities. CSW spoke with Jack [name] by phone, and has interest in her father returning to [facility] for rehab. [facility] is able to accept him.   Assessment/plan status:  Information/Referral to [employer] Other assessment/ plan:   [facility] list provided to patient.   Information/referral to community resources:   [facility] list provided to patient.    PATIENT'S/FAMILY'S RESPONSE TO PLAN OF CARE: Patient and daughter Jack [name] was open and responsive speaking with CSW.

## 2014-02-24 NOTE — Progress Notes (Signed)
Physical Therapy Treatment Patient Details Name: Jack [name] MRN: [id number] DOB: [birth year] Today's Date: 02/24/2014    History of Present Illness 69 y.o.-year-old male who has failed foot salvage intervention on the right he has a transtibial amputation on the left and presents now s/p right transtibial amputation.    PT Comments    Pt showing very slow progress towards physical therapy goals. Pt initially required min assist for lateral scoot transfer on PT eval, and today was requiring +2 total assist to transition bed to chair. Pt very agreeable during session, however appears to have difficulty sequencing and problem solving in addition to decreased trunk control noted this session. Because of this, pt is more appropriate for a SNF level rehab at this time. Will continue to follow and progress as able per POC.   Follow Up Recommendations  SNF;Supervision/Assistance - 24 hour     Equipment Recommendations  None recommended by PT    Recommendations for Other Services       Precautions / Restrictions Precautions Precautions: Fall Precaution Comments: double amputee Restrictions Weight Bearing Restrictions: Yes RLE Weight Bearing: Non weight bearing    Mobility  Bed Mobility Overal bed mobility: Needs Assistance Bed Mobility: Supine to Sit Rolling: Min assist   Supine to sit: Max assist;+2 for physical assistance     General bed mobility comments: Pt required +2 assist to transition to EOB. Pt with difficulty elevating trunk to full sitting position - pt was unable to maintain upright posture.  Transfers Overall transfer level: Needs assistance Equipment used: 2 person hand held assist Transfers: Lateral/Scoot Transfers          Lateral/Scoot Transfers: Total assist;+2 physical assistance General transfer comment: Pt was unable to use UE's to assist to assist with scooting. Pt was requiring max assist to maintain upright posture, as well as for scooting with  the bed pad for assist.   Ambulation/Gait                 Stairs            Wheelchair Mobility    Modified Rankin (Stroke Patients Only)       Balance Overall balance assessment: Needs assistance Sitting-balance support: Feet supported;No upper extremity supported Sitting balance-Leahy Scale: Zero Sitting balance - Comments: Max assist at times to maintain upright posture Postural control: Posterior lean;Right lateral lean                          Cognition Arousal/Alertness: Awake/alert Behavior During Therapy: WFL for tasks assessed/performed Overall Cognitive Status: Impaired/Different from baseline Area of Impairment: Problem solving;Awareness;Safety/judgement;Following commands Orientation Level: Time   Memory: Decreased recall of precautions Following Commands: Follows one step commands inconsistently;Follows one step commands with increased time Safety/Judgement: Decreased awareness of deficits Awareness: Emergent Problem Solving: Slow processing;Difficulty sequencing General Comments: Pt had difficulty sequencing how to start doing chair push ups for pressure relief.  When walked through it step by step, pt could complete with mod assist.    Exercises      General Comments        Pertinent Vitals/Pain Pain Assessment: Faces Pain Score: 7  Faces Pain Scale: Hurts little more Pain Location: R residual limb Pain Descriptors / Indicators: Throbbing Pain Intervention(s): Limited activity within patient's tolerance;Monitored during session;Repositioned    Home Living                      Prior Function  PT Goals (current goals can now be found in the care plan section) Acute Rehab PT Goals Patient Stated Goal: Pt wants to go home and manage by himself w/ PRN daughter assist PT Goal Formulation: With patient Time For Goal Achievement: 02/26/14 Potential to Achieve Goals: Good Progress towards PT goals: Not  progressing toward goals - comment    Frequency  Min 3X/week    PT Plan Discharge plan needs to be updated    Co-evaluation             End of Session Equipment Utilized During Treatment: Gait belt Activity Tolerance: Patient limited by fatigue Patient left: in bed;with call bell/phone within reach;with bed alarm set     Time: 1015-1040 PT Time Calculation (min) (ACUTE ONLY): 25 min  Charges:  $Therapeutic Activity: 23-37 mins                    G Codes:      Rolinda Roan 17-Mar-2014, 1:36 PM  Rolinda Roan, PT, DPT Acute Rehabilitation Services Pager: [phone]

## 2014-02-24 NOTE — Progress Notes (Signed)
[facility] Progress Note  Assessment/Plan: 1. S/P right BKA due to R heel ulcer with osteomylitis- Dr Sharol Given 2/5- denied for inpt rehab - needs SNF 2. ESRD - MWF HD first round due to planned d/c to SNF Friday K 4.5: 3. Hypertension/volume -BP ok left 0.4 kgs under edw before surgery. No BP meds; net UF 2.7 Monday with post HD weight of 64.4 (hoyer)- about 3 kg below prior edw; was not weighed with hoyer pre HD today; bed weight 68.5 - net UF 1 L post HD weight 66.6, 1.5 below EDW -  4. Anemia of chronic disease down post op due to acute blood loss - hgb 10.8 down to 8 on Wed post op - Aranesp 60 given on 2/10 Cont Fe thru 2/12- repeat CBC 2/12 pending; If < 7.5 5. Metabolic bone disease - cont phoslo/fosrenol and hectorol - last PTH 237- Ca/P ok 6. Nutrition - alb 2.3 but intake good, renal vit. Renal diet and supplements 7. Disp prob SNF Friday  [place] 02/24/2014,11:00 AM  LOS: 6 days   Pt seen, examined and agree w A/P as above.  [name] MD pager [phone]    cell [phone] 02/24/2014, 2:46 PM    Subjective:   Hasn't selected a SNF yet, bottom is sore,using a cream.  RN states no skin breakdown only redness  Objective Filed Vitals:   02/23/14 1300 02/23/14 1818 02/23/14 2032 02/24/14 0538  BP: 112/44 110/53 138/65 99/55  Pulse: 81 93 91 79  Temp: 98.2 F (36.8 C) 98.7 F (37.1 C) 99 F (37.2 C) 98.2 F (36.8 C)  TempSrc: Oral Oral Oral Oral  Resp: 16 18 18 16   Height:   5\' 10"  (1.778 m)   Weight: 66.6 kg (146 lb 13.2 oz)  66.5 kg (146 lb 9.7 oz)   SpO2: 98% 96% 100% 100%   Physical Exam General: Heart: Lungs: Abdomen: Extremities: Dialysis Access: left AVF  [facility]  4 hours 67.5 kgs 2k/2ca 2000 heparin 400/800  hectorol 1 q HD venofer 100 x5 -   Additional Objective Labs: Basic Metabolic Panel:  Recent Labs Lab 02/17/14 1551 02/18/14 1150  02/21/14 0852 02/23/14 0920  NA 139 137 136 139  K 4.4 3.7 4.7 4.5  CL 99  --  98 101  CO2 30  --  25 27  GLUCOSE 189* 135* 120* 120*  BUN 20  --  43* 40*  CREATININE 6.18*  --  8.37* 7.26*  CALCIUM 9.5  --  8.6 9.1  PHOS  --   --  4.1 3.6   Liver Function Tests:  Recent Labs Lab 02/17/14 1551 02/21/14 0852 02/23/14 0920  AST 15  --   --   ALT 16  --   --   ALKPHOS 86  --   --   BILITOT 0.5  --   --   PROT 7.1  --   --   ALBUMIN 3.0* 2.3* 2.2*   CBC:  Recent Labs Lab 02/17/14 1551 02/18/14 1150 02/21/14 0852 02/23/14 0919  WBC 9.1  --  9.8 8.5  HGB 10.8* 12.6* 8.5* 8.0*  HCT 35.3* 37.0* 27.5* 26.3*  MCV 84.9  --  84.1 87.4  PLT 307  --  238 252   CBG:  Recent Labs Lab 02/20/14 2100 02/21/14 0748 02/21/14 1320 02/21/14 1633 02/23/14 2030  GLUCAP 121* 104* 132* 133* 168*   IMedications:   . aspirin EC  81 mg Oral Daily  . calcium acetate  1,334 mg Oral TID WC  . colchicine  0.6 mg Oral Once per day on Mon Thu  . docusate sodium  100 mg Oral BID  . doxercalciferol  1 mcg Intravenous Q M,W,F-HD  . feeding supplement (RESOURCE BREEZE)  1 Container Oral TID BM  . ferric gluconate (FERRLECIT/NULECIT) IV  125 mg Intravenous Q M,W,F-HD  . gabapentin  200 mg Oral QHS  . lanthanum  1,000 mg Oral TID WC  . levothyroxine  175 mcg Oral QAC breakfast  . multivitamin  1 tablet Oral QHS

## 2014-02-24 NOTE — Progress Notes (Signed)
Occupational Therapy Treatment Patient Details Name: Jack [name] MRN: [id number] DOB: 1945-07-22 Today's Date: 02/24/2014    History of present illness 69 y.o.-year-old male who has failed foot salvage intervention on the right he has a transtibial amputation on the left and presents now s/p right transtibial amputation.   OT comments  Pt with very slow progress toward goals today. Pt was found in chair tired and c/o pain in buttocks from sitting up.  Pt has small pressure area developing on buttocks and nursing was made aware of this.  Pt did lateral scoot transfer back to bed with 2 person assist. Rec, to avoid shearing, that pt/staff use lift to get pt out of bed.  Nursing aware.  Pt slow to problem solve today and had some trouble recalling the date.   Follow Up Recommendations  SNF;Supervision/Assistance - 24 hour    Equipment Recommendations       Recommendations for Other Services      Precautions / Restrictions Precautions Precautions: Fall Precaution Comments: double amputee Restrictions Weight Bearing Restrictions: Yes RLE Weight Bearing: Non weight bearing       Mobility Bed Mobility Overal bed mobility: Needs Assistance Bed Mobility: Rolling Rolling: Min assist         General bed mobility comments: Pt very fatigued from sitting in chair and therefore was not able to assist much with transfer.  Care should be taken during transfers to avoid shearing due to pts pressure sore on his sacrum.  Transfers Overall transfer level: Needs assistance Equipment used: None Transfers: Lateral/Scoot Transfers          Lateral/Scoot Transfers: Total assist;+2 physical assistance General transfer comment: Pt very fatigued and unable to assist at all with UES during transfer.  Pt may need to use lift with staff due to shearing on area of pressure wound.    Balance Overall balance assessment: Needs assistance Sitting-balance support: Bilateral upper extremity  supported;Feet unsupported Sitting balance-Leahy Scale: Poor Sitting balance - Comments: Pt had a heavy L lean today even with cues on how to support sefl. Postural control: Left lateral lean;Posterior lean                         ADL Overall ADL's : Needs assistance/impaired     Grooming: Wash/dry face;Wash/dry hands;Set up;Sitting Grooming Details (indicate cue type and reason): pt required max assist for balance on EOB.  In chair, pt could groom without assist but once outside balance was taken away, pt struggled just to sit up on the side of the bed.                 Toilet Transfer: Maximal assistance;+2 for physical assistance;Requires drop arm;Transfer board Toilet Transfer Details (indicate cue type and reason): Pt with decreased ability to hold self up today and assist with BUEs in the transfer.  Pt required a lot of outside assist to maintain balance while others transferred pt.  Rec lift for nursing staff. Toileting- Clothing Manipulation and Hygiene: Total assistance;Bed level Toileting - Clothing Manipulation Details (indicate cue type and reason): pt rolled side to side with min assist and cues for where to reach with hands to roll.     Functional mobility during ADLs: Maximal assistance;+2 for physical assistance;Cueing for sequencing;Cueing for safety General ADL Comments: Pt appeared mildly confused today and was not oriented to time.  Pt required several sequencing cues to prepare for pressure relief tasks even when task demonstrated. Pt stated his  bottom hurt.  Pt does have open place on his sacrum that was shown to nursing. Pt was placed on side in bed after transfer to attempt to get this pressure relief.       Vision                     Perception     Praxis      Cognition   Behavior During Therapy: [facility] for tasks assessed/performed Overall Cognitive Status: Impaired/Different from baseline Area of Impairment: Orientation;Problem  solving Orientation Level: Time            Problem Solving: Slow processing;Difficulty sequencing General Comments: Pt had difficulty sequencing how to start doing chair push ups for pressure relief.  When walked through it step by step, pt could complete with mod assist.    Extremity/Trunk Assessment               Exercises     Shoulder Instructions       General Comments      Pertinent Vitals/ Pain       Pain Assessment: 0-10 Pain Score: 7  Pain Location: R residual limb and buttocks. Pain Descriptors / Indicators: Throbbing Pain Intervention(s): Limited activity within patient's tolerance;Monitored during session;Repositioned  Home Living                                          Prior Functioning/Environment              Frequency Min 2X/week     Progress Toward Goals  OT Goals(current goals can now be found in the care plan section)  Progress towards OT goals: Progressing toward goals  Acute Rehab OT Goals Patient Stated Goal: Pt wants to go home and manage by himself w/ PRN daughter assist Time For Goal Achievement: 03/08/14 Potential to Achieve Goals: Good ADL Goals Pt Will Perform Grooming: with set-up;sitting Pt Will Perform Upper Body Bathing: with set-up;sitting Pt Will Perform Lower Body Bathing: with set-up;bed level Pt Will Transfer to Toilet: with supervision;anterior/posterior transfer;bedside commode Pt Will Perform Toileting - Clothing Manipulation and hygiene: with modified independence;with supervision;sitting/lateral leans  Plan Discharge plan needs to be updated    Co-evaluation                 End of Session     Activity Tolerance Patient limited by fatigue   Patient Left in bed;with call bell/phone within reach   Nurse Communication Mobility status        Time: 0786-7544 OT Time Calculation (min): 25 min  Charges: OT General Charges $OT Visit: 1 Procedure OT Treatments $Self Care/Home  Management : 23-37 mins  Glenford Peers 02/24/2014, 12:37 PM  [phone]

## 2014-02-24 NOTE — Progress Notes (Signed)
Inpatient Diabetes Program Recommendations  AACE/ADA: New Consensus Statement on Inpatient Glycemic Control (2013)  Target Ranges:  Prepandial:   less than 140 mg/dL      Peak postprandial:   less than 180 mg/dL (1-2 hours)      Critically ill patients:  140 - 180 mg/dL   History of diabetes. No outpatient diabetes medications.   While inpatient, may want to consider ordering CBGs and Novolog sensitive correction scale TID with meals.  Gentry Fitz, RN, BA, MHA, CDE Diabetes Coordinator Inpatient Diabetes Program  [phone] (Team Pager) [phone] [facility]) 02/24/2014 3:01 PM

## 2014-02-24 NOTE — Progress Notes (Signed)
Patient ID: Jack [name], male   DOB: Jan 09, 1946, 69 y.o.   MRN: [id number] Patient complaining of sore throat. Order written for throat spray. Anticipate discharge to skilled nursing tomorrow. F L2 is not on the chart.

## 2014-02-25 LAB — RENAL FUNCTION PANEL
Albumin: 2.2 g/dL — ABNORMAL LOW (ref 3.5–5.2)
Anion gap: 12 (ref 5–15)
BUN: 30 mg/dL — ABNORMAL HIGH (ref 6–23)
CO2: 27 mmol/L (ref 19–32)
Calcium: 9.3 mg/dL (ref 8.4–10.5)
Chloride: 96 mmol/L (ref 96–112)
Creatinine, Ser: 6.42 mg/dL — ABNORMAL HIGH (ref 0.50–1.35)
GFR calc Af Amer: 9 mL/min — ABNORMAL LOW (ref >90)
GFR calc non Af Amer: 8 mL/min — ABNORMAL LOW (ref >90)
Glucose, Bld: 187 mg/dL — ABNORMAL HIGH (ref 70–99)
Phosphorus: 2.9 mg/dL (ref 2.3–4.6)
Potassium: 3.7 mmol/L (ref 3.5–5.1)
Sodium: 135 mmol/L (ref 135–145)

## 2014-02-25 LAB — CBC
HCT: 25.9 % — ABNORMAL LOW (ref 39.0–52.0)
Hemoglobin: 7.8 g/dL — ABNORMAL LOW (ref 13.0–17.0)
MCH: 26.2 pg (ref 26.0–34.0)
MCHC: 30.1 g/dL (ref 30.0–36.0)
MCV: 86.9 fL (ref 78.0–100.0)
Platelets: 270 10*3/uL (ref 150–400)
RBC: 2.98 MIL/uL — ABNORMAL LOW (ref 4.22–5.81)
RDW: 15 % (ref 11.5–15.5)
WBC: 11.8 10*3/uL — ABNORMAL HIGH (ref 4.0–10.5)

## 2014-02-25 LAB — PREPARE RBC (CROSSMATCH)

## 2014-02-25 MED ORDER — HYDROMORPHONE HCL 1 MG/ML IJ SOLN
INTRAMUSCULAR | Status: AC
  Filled 2014-02-25: qty 1

## 2014-02-25 MED ORDER — OXYCODONE-ACETAMINOPHEN 5-325 MG PO TABS: 1.0000 | ORAL_TABLET | ORAL | Status: DC | PRN

## 2014-02-25 MED ORDER — SEVELAMER CARBONATE 800 MG PO TABS: 1600.0000 mg | ORAL_TABLET | Freq: Three times a day (TID) | ORAL | Status: DC

## 2014-02-25 MED ORDER — DOXERCALCIFEROL 4 MCG/2ML IV SOLN
INTRAVENOUS | Status: AC
  Filled 2014-02-25: qty 2

## 2014-02-25 MED ORDER — SODIUM CHLORIDE 0.9 % IV SOLN: Freq: Once | INTRAVENOUS | Status: DC

## 2014-02-25 NOTE — Progress Notes (Signed)
Attempted to call report x 2, was disconnected after 4 tranfers first attempt. Disconnected after 1 transfer 2nd attempt. [phone].

## 2014-02-25 NOTE — Care Management Note (Signed)
Newt Minion, MD Physician Signed Orthopedics Discharge Summaries 02/25/2014 6:57 AM    Expand All Collapse All   Physician Discharge Summary  Patient ID: Jack [name] MRN: [id number] DOB/AGE: 1945-02-01 69 y.o.  Admit date: 02/18/2014 Discharge date: 02/25/2014  Admission Diagnoses: Osteomyelitis right foot  Discharge Diagnoses:  Active Problems:  Below knee amputation status   Discharged Condition: stable  Hospital Course: Patient's hospital course was essentially unremarkable. He underwent a transtibial amputation on the right. Postoperatively patient progressed slowly with therapy and was discharged to skilled nursing in stable condition.  Consults: nephrology  Significant Diagnostic Studies: labs: Routine labs  Treatments: dialysis: Hemodialysis and surgery: See operative note  Discharge Exam: Blood pressure 137/63, pulse 80, temperature 99.8 F (37.7 C), temperature source Oral, resp. rate 15, height 5\' 10"  (1.778 m), weight 67.9 kg (149 lb 11.1 oz), SpO2 98 %. Incision/Wound: incision clean dry and intact  Disposition: 01-Home or Self Care  Discharge Instructions    Call MD / Call 911  Complete by: As directed   If you experience chest pain or shortness of breath, CALL 911 and be transported to the hospital emergency room. If you develope a fever above 101 F, pus (white drainage) or increased drainage or redness at the wound, or calf pain, call your surgeon's office.     Constipation Prevention  Complete by: As directed   Drink plenty of fluids. Prune juice may be helpful. You may use a stool softener, such as Colace (over the counter) 100 mg twice a day. Use MiraLax (over the counter) for constipation as needed.     Diet - low sodium heart healthy  Complete by: As directed      Increase activity slowly as tolerated  Complete by: As directed             Medication List    TAKE these medications        aspirin EC 81 MG tablet  Take 81 mg by mouth daily.     multivitamin Tabs tablet  Take 1 tablet by mouth at bedtime.     b complex-vitamin c-folic acid 0.8 MG Tabs tablet  Take 1 tablet by mouth daily.     calcium acetate 667 MG capsule  Commonly known as: PHOSLO  Take 1,334 mg by mouth 3 (three) times daily with meals.     cefTAZidime 2 g in dextrose 5 % 50 mL  Inject 2 g into the vein every Saturday with hemodialysis. One dose per holiday schedule     cefTAZidime 2 g in dextrose 5 % 50 mL  Inject 2 g into the vein every Monday, Wednesday, and Friday with hemodialysis. X 2 weeks     colchicine 0.6 MG tablet  Take 1 tablet (0.6 mg total) by mouth 2 (two) times a week.     feeding supplement (RESOURCE BREEZE) Liqd  Take 1 Container by mouth 3 (three) times daily between meals.     gabapentin 100 MG capsule  Commonly known as: NEURONTIN  Take 2 capsules (200 mg total) by mouth at bedtime.     lanthanum 1000 MG chewable tablet  Commonly known as: FOSRENOL  Chew 1,000 mg by mouth 3 (three) times daily with meals.     levothyroxine 175 MCG tablet  Commonly known as: SYNTHROID, LEVOTHROID  Take 175 mcg by mouth daily before breakfast.     Oxycodone HCl 10 MG Tabs  Take 0.5-1 tablets (5-10 mg total) by mouth every 6 (six) hours as  needed (for severe pain).     oxyCODONE-acetaminophen 5-325 MG per tablet  Commonly known as: ROXICET  Take 1 tablet by mouth every 4 (four) hours as needed for severe pain.     promethazine 12.5 MG tablet  Commonly known as: PHENERGAN  Take 1 tablet (12.5 mg total) by mouth every 6 (six) hours as needed for nausea or vomiting.     Vancomycin 750 MG/150ML Soln  Commonly known as: VANCOCIN  Inject 150 mLs (750 mg total) into the vein every Monday, Wednesday, and Friday with hemodialysis. X 2 weeks           Follow-up Information    Follow up with [name],MARCUS V, MD  In 2 weeks.   Specialty: Orthopedic Surgery   Contact information:   [place] [address] [phone]       Signed: Newt Minion 02/25/2014, 6:57 AM        Routing History     Date/Time From To Method   02/25/2014 6:58 AM Newt Minion, MD Newt Minion, MD In Basket   02/25/2014 6:58 AM Newt Minion, MD Lucianne Lei, MD Fax

## 2014-02-25 NOTE — Clinical Social Work Placement (Addendum)
Clinical Social Work Department CLINICAL SOCIAL WORK PLACEMENT NOTE 02/25/2014  Patient:  Jack [name], Jack [name]  Account Number:  [id number] Admit date:  02/18/2014  Clinical Social Worker:  Cage Gupton Givens, LCSW  Date/time:  02/25/2014 12:00 M  Clinical Social Work is seeking post-discharge placement for this patient at the following level of care:   SKILLED NURSING   (*CSW will update this form in Epic as items are completed)     Patient/family provided with [facility] Department of Clinical Social Work's list of facilities offering this level of care within the geographic area requested by the patient (or if unable, by the patient's family).  02/24/2014  Patient/family informed of their freedom to choose among providers that offer the needed level of care, that participate in Medicare, Medicaid or managed care program needed by the patient, have an available bed and are willing to accept the patient.    Patient/family informed of [facility]' ownership interest in [facility], as well as of the fact that they are under no obligation to receive care at this facility.  PASARR submitted to EDS on  PASARR number received on   FL2 transmitted to all facilities in geographic area requested by pt/family on  02/24/2014 FL2 transmitted to all facilities within larger geographic area on   Patient informed that his/her managed care company has contracts with or will negotiate with  certain facilities, including the following:     Patient/family informed of bed offers received:  02/25/2014 Patient chooses bed at [place] Physician recommends and patient chooses bed at    Patient to be transferred to [facility] on  02/25/2014 Patient to be transferred to facility by  Patient and family notified of transfer on 02/25/2014 Name of family member notified:  [name] and Celedonio Savage, daughters  The following physician request were entered in Epic:  Additional  Comments: 02/25/14: CSW talked with patient's daughter's by phone regarding discharge and completing admissions paperwork at facility. CSW visited with patient to inform him of today's discharge to [facility].     Jonathan Kirkendoll Givens, MSW, LCSW Licensed Clinical Social Worker [place] [phone]

## 2014-02-25 NOTE — Progress Notes (Signed)
Pt left floor via [facility] by ambulance.

## 2014-02-25 NOTE — Care Management Note (Signed)
CARE MANAGEMENT NOTE 02/25/2014  Patient:  BHARGAV, BARBARO   Account Number:  [id number]  Date Initiated:  02/21/2014  Documentation initiated by:  Travarus Trudo  Subjective/Objective Assessment:   CM Following for progression and d/c planning.     Action/Plan:   02/21/14 Post op rt transtib. await PT/OT evals for possible CIR. CIR following  02/22/14 Per CIR rep, pt insurance has denied CIR. CSW working on short term SNF.   Anticipated DC Date:  02/25/2014   Anticipated DC Plan:  SKILLED NURSING FACILITY         Choice offered to / List presented to:             Status of service:  Completed, signed off Medicare Important Message given?  YES (If response is "NO", the following Medicare IM given date fields will be blank) Date Medicare IM given:  02/21/2014 Medicare IM given by:  Ryan Ogborn Date Additional Medicare IM given:  02/25/2014 Additional Medicare IM given by:  [facility]  Discharge Disposition:  [place]  Per UR Regulation:    If discussed at Long Length of Stay Meetings, dates discussed:    Comments:

## 2014-02-25 NOTE — Progress Notes (Signed)
Subjective:  Anticipated discharge to [facility] today, mild right leg pain  Objective: Vital signs in last 24 hours: Temp:  [99.3 F (37.4 C)-100.5 F (38.1 C)] 99.8 F (37.7 C) (02/12 0438) Pulse Rate:  [78-84] 80 (02/12 0438) Resp:  [15-18] 15 (02/12 0438) BP: (114-137)/(58-73) 137/63 mmHg (02/12 0438) SpO2:  [97 %-99 %] 98 % (02/12 0438) Weight:  [67.9 kg (149 lb 11.1 oz)] 67.9 kg (149 lb 11.1 oz) (02/11 2103) Weight change: -0.8 kg (-1 lb 12.2 oz)  Intake/Output from previous day: 02/11 0701 - 02/12 0700 In: 2880 [P.O.:2880] Out: 0  Intake/Output this shift:   Lab Results:  Recent Labs  02/23/14 0919  WBC 8.5  HGB 8.0*  HCT 26.3*  PLT 252   BMET:  Recent Labs  02/23/14 0920  NA 139  K 4.5  CL 101  CO2 27  GLUCOSE 120*  BUN 40*  CREATININE 7.26*  CALCIUM 9.1  ALBUMIN 2.2*   No results for input(s): PTH in the last 72 hours. Iron Studies: No results for input(s): IRON, TIBC, TRANSFERRIN, FERRITIN in the last 72 hours.  Studies/Results: No results found.  EXAM: General appearance:  Alert, in no apparent distress Resp:  CTA without rales, rhonchi, or wheezes Cardio:  RRR with Gr II/VI systolic murmur, no rub GI:  + BS, soft and nontender Extremities:  Dressing on R BKA, L BKA well-healed, no edema Access: AVF @ LUA with + bruit  [facility] [place] [facility]  4 hours 67.5 kgs 2k/2ca 2000 heparin 400/800  hectorol 1 q HD venofer 100 x5   Assessment/Plan: 1. R heel ulcer with osteomyelitis - s/p R BKA by Dr. Sharol Given 2/5, rehab pending @ [facility]. 2. ESRD - HD on MWF @ [place], K 4.5.  HD pending today. 3. HTN/Volume - BP 137/63, no meds; declining wt before surgery sec to poor appetite, wt 67.9 kg today, down to 64.4 on 2/8.    4. Anemia - Hgb down to 8 post-surgery, Aranesp 60 mcg given 2/10, s/p Fe loading,  check CBC today. 5. Sec HPT - Ca 9.1 (10.5 corrected), P 3.6; Hectorol 1 mcg, Phoslo 2 with meals.  Change binder to  Renvela. 6. Nutrition - Alb 2.2, renal diet, vitamin.   LOS: 7 days   [name],Jack 02/25/2014,7:59 AM  Pt seen, examined and agree w A/P as above.  [name] MD pager [phone]    cell [phone] 02/25/2014, 10:32 AM

## 2014-02-25 NOTE — Discharge Summary (Signed)
Physician Discharge Summary  Patient ID: Jack [name] MRN: [id number] DOB/AGE: [birth year] 71 y.o.  Admit date: 02/18/2014 Discharge date: 02/25/2014  Admission Diagnoses: Osteomyelitis right foot  Discharge Diagnoses:  Active Problems:   Below knee amputation status   Discharged Condition: stable  Hospital Course: Patient's hospital course was essentially unremarkable. He underwent a transtibial amputation on the right. Postoperatively patient progressed slowly with therapy and was discharged to skilled nursing in stable condition.  Consults: nephrology  Significant Diagnostic Studies: labs: Routine labs  Treatments: dialysis: Hemodialysis and surgery: See operative note  Discharge Exam: Blood pressure 137/63, pulse 80, temperature 99.8 F (37.7 C), temperature source Oral, resp. rate 15, height 5\' 10"  (1.778 m), weight 67.9 kg (149 lb 11.1 oz), SpO2 98 %. Incision/Wound: incision clean dry and intact  Disposition: 01-Home or Self Care  Discharge Instructions    Call MD / Call 911    Complete by:  As directed   If you experience chest pain or shortness of breath, CALL 911 and be transported to the hospital emergency room.  If you develope a fever above 101 F, pus (white drainage) or increased drainage or redness at the wound, or calf pain, call your surgeon's office.     Constipation Prevention    Complete by:  As directed   Drink plenty of fluids.  Prune juice may be helpful.  You may use a stool softener, such as Colace (over the counter) 100 mg twice a day.  Use MiraLax (over the counter) for constipation as needed.     Diet - low sodium heart healthy    Complete by:  As directed      Increase activity slowly as tolerated    Complete by:  As directed             Medication List    TAKE these medications        aspirin EC 81 MG tablet  Take 81 mg by mouth daily.     multivitamin Tabs tablet  Take 1 tablet by mouth at bedtime.     b complex-vitamin c-folic  acid 0.8 MG Tabs tablet  Take 1 tablet by mouth daily.     calcium acetate 667 MG capsule  Commonly known as:  PHOSLO  Take 1,334 mg by mouth 3 (three) times daily with meals.     cefTAZidime 2 g in dextrose 5 % 50 mL  Inject 2 g into the vein every Saturday with hemodialysis. One dose per holiday schedule     cefTAZidime 2 g in dextrose 5 % 50 mL  Inject 2 g into the vein every Monday, Wednesday, and Friday with hemodialysis. X 2 weeks     colchicine 0.6 MG tablet  Take 1 tablet (0.6 mg total) by mouth 2 (two) times a week.     feeding supplement (RESOURCE BREEZE) Liqd  Take 1 Container by mouth 3 (three) times daily between meals.     gabapentin 100 MG capsule  Commonly known as:  NEURONTIN  Take 2 capsules (200 mg total) by mouth at bedtime.     lanthanum 1000 MG chewable tablet  Commonly known as:  FOSRENOL  Chew 1,000 mg by mouth 3 (three) times daily with meals.     levothyroxine 175 MCG tablet  Commonly known as:  SYNTHROID, LEVOTHROID  Take 175 mcg by mouth daily before breakfast.     Oxycodone HCl 10 MG Tabs  Take 0.5-1 tablets (5-10 mg total) by mouth every 6 (six) hours  as needed (for severe pain).     oxyCODONE-acetaminophen 5-325 MG per tablet  Commonly known as:  ROXICET  Take 1 tablet by mouth every 4 (four) hours as needed for severe pain.     promethazine 12.5 MG tablet  Commonly known as:  PHENERGAN  Take 1 tablet (12.5 mg total) by mouth every 6 (six) hours as needed for nausea or vomiting.     Vancomycin 750 MG/150ML Soln  Commonly known as:  VANCOCIN  Inject 150 mLs (750 mg total) into the vein every Monday, Wednesday, and Friday with hemodialysis. X 2 weeks           Follow-up Information    Follow up with Miner Koral V, MD In 2 weeks.   Specialty:  Orthopedic Surgery   Contact information:   [place] [address] [phone]       Signed: Newt Minion 02/25/2014, 6:57 AM

## 2014-02-26 LAB — TYPE AND SCREEN
ABO/RH(D): B POS
Antibody Screen: NEGATIVE
UNIT DIVISION: 0
UNIT DIVISION: 0

## 2014-03-04 ENCOUNTER — Non-Acute Institutional Stay (SKILLED_NURSING_FACILITY): Payer: Medicare Other | Admitting: Internal Medicine

## 2014-03-04 DIAGNOSIS — M109 Gout, unspecified: Secondary | ICD-10-CM

## 2014-03-04 DIAGNOSIS — Z89511 Acquired absence of right leg below knee: Secondary | ICD-10-CM

## 2014-03-04 DIAGNOSIS — Z992 Dependence on renal dialysis: Secondary | ICD-10-CM

## 2014-03-04 DIAGNOSIS — E10628 Type 1 diabetes mellitus with other skin complications: Secondary | ICD-10-CM

## 2014-03-04 DIAGNOSIS — L089 Local infection of the skin and subcutaneous tissue, unspecified: Secondary | ICD-10-CM

## 2014-03-04 DIAGNOSIS — N185 Chronic kidney disease, stage 5: Secondary | ICD-10-CM

## 2014-03-04 DIAGNOSIS — I10 Essential (primary) hypertension: Secondary | ICD-10-CM

## 2014-03-04 DIAGNOSIS — E039 Hypothyroidism, unspecified: Secondary | ICD-10-CM

## 2014-03-04 DIAGNOSIS — H409 Unspecified glaucoma: Secondary | ICD-10-CM

## 2014-03-04 DIAGNOSIS — M869 Osteomyelitis, unspecified: Secondary | ICD-10-CM

## 2014-03-04 DIAGNOSIS — E1151 Type 2 diabetes mellitus with diabetic peripheral angiopathy without gangrene: Secondary | ICD-10-CM

## 2014-03-04 DIAGNOSIS — L89152 Pressure ulcer of sacral region, stage 2: Secondary | ICD-10-CM

## 2014-03-04 DIAGNOSIS — N186 End stage renal disease: Secondary | ICD-10-CM

## 2014-03-07 ENCOUNTER — Encounter (HOSPITAL_COMMUNITY): Payer: Self-pay | Admitting: *Deleted

## 2014-03-07 ENCOUNTER — Emergency Department (HOSPITAL_COMMUNITY)
Admission: EM | Admit: 2014-03-07 | Discharge: 2014-03-07 | Disposition: A | Discharge status: Home / Self Care | Payer: Medicare Other | Attending: Emergency Medicine | Admitting: Emergency Medicine

## 2014-03-07 ENCOUNTER — Emergency Department (HOSPITAL_COMMUNITY): Payer: Medicare Other

## 2014-03-07 ENCOUNTER — Encounter: Payer: Self-pay | Admitting: Internal Medicine

## 2014-03-07 DIAGNOSIS — H409 Unspecified glaucoma: Secondary | ICD-10-CM

## 2014-03-07 DIAGNOSIS — M79606 Pain in leg, unspecified: Secondary | ICD-10-CM

## 2014-03-07 DIAGNOSIS — Z87891 Personal history of nicotine dependence: Secondary | ICD-10-CM | POA: Diagnosis not present

## 2014-03-07 DIAGNOSIS — M79604 Pain in right leg: Secondary | ICD-10-CM | POA: Insufficient documentation

## 2014-03-07 DIAGNOSIS — Z8669 Personal history of other diseases of the nervous system and sense organs: Secondary | ICD-10-CM | POA: Diagnosis not present

## 2014-03-07 DIAGNOSIS — Z992 Dependence on renal dialysis: Secondary | ICD-10-CM | POA: Diagnosis not present

## 2014-03-07 DIAGNOSIS — E039 Hypothyroidism, unspecified: Secondary | ICD-10-CM | POA: Insufficient documentation

## 2014-03-07 DIAGNOSIS — Z7982 Long term (current) use of aspirin: Secondary | ICD-10-CM | POA: Diagnosis not present

## 2014-03-07 DIAGNOSIS — L89152 Pressure ulcer of sacral region, stage 2: Secondary | ICD-10-CM

## 2014-03-07 DIAGNOSIS — Z872 Personal history of diseases of the skin and subcutaneous tissue: Secondary | ICD-10-CM | POA: Insufficient documentation

## 2014-03-07 DIAGNOSIS — Z9889 Other specified postprocedural states: Secondary | ICD-10-CM | POA: Diagnosis not present

## 2014-03-07 DIAGNOSIS — R011 Cardiac murmur, unspecified: Secondary | ICD-10-CM | POA: Insufficient documentation

## 2014-03-07 DIAGNOSIS — I120 Hypertensive chronic kidney disease with stage 5 chronic kidney disease or end stage renal disease: Secondary | ICD-10-CM | POA: Insufficient documentation

## 2014-03-07 DIAGNOSIS — E119 Type 2 diabetes mellitus without complications: Secondary | ICD-10-CM | POA: Insufficient documentation

## 2014-03-07 DIAGNOSIS — N186 End stage renal disease: Secondary | ICD-10-CM | POA: Diagnosis not present

## 2014-03-07 DIAGNOSIS — Z8701 Personal history of pneumonia (recurrent): Secondary | ICD-10-CM | POA: Diagnosis not present

## 2014-03-07 DIAGNOSIS — Z79899 Other long term (current) drug therapy: Secondary | ICD-10-CM | POA: Insufficient documentation

## 2014-03-07 DIAGNOSIS — T8789 Other complications of amputation stump: Secondary | ICD-10-CM | POA: Insufficient documentation

## 2014-03-07 DIAGNOSIS — Z85820 Personal history of malignant melanoma of skin: Secondary | ICD-10-CM | POA: Diagnosis not present

## 2014-03-07 DIAGNOSIS — Z8614 Personal history of Methicillin resistant Staphylococcus aureus infection: Secondary | ICD-10-CM | POA: Diagnosis not present

## 2014-03-07 DIAGNOSIS — J45909 Unspecified asthma, uncomplicated: Secondary | ICD-10-CM | POA: Diagnosis not present

## 2014-03-07 DIAGNOSIS — M109 Gout, unspecified: Secondary | ICD-10-CM | POA: Insufficient documentation

## 2014-03-07 HISTORY — DX: Unspecified glaucoma: H40.9

## 2014-03-07 HISTORY — DX: Pressure ulcer of sacral region, stage 2: L89.152

## 2014-03-07 LAB — I-STAT CHEM 8, ED
BUN: 18 mg/dL (ref 6–23)
Calcium, Ion: 1.23 mmol/L (ref 1.13–1.30)
Chloride: 97 mmol/L (ref 96–112)
Creatinine, Ser: 4.2 mg/dL — ABNORMAL HIGH (ref 0.50–1.35)
GLUCOSE: 122 mg/dL — AB (ref 70–99)
HEMATOCRIT: 48 % (ref 39.0–52.0)
Hemoglobin: 16.3 g/dL (ref 13.0–17.0)
Potassium: 4.3 mmol/L (ref 3.5–5.1)
SODIUM: 140 mmol/L (ref 135–145)
TCO2: 31 mmol/L (ref 0–100)

## 2014-03-07 LAB — CBC WITH DIFFERENTIAL/PLATELET
BASOS ABS: 0.1 10*3/uL (ref 0.0–0.1)
Basophils Relative: 1 % (ref 0–1)
EOS ABS: 0.3 10*3/uL (ref 0.0–0.7)
Eosinophils Relative: 3 % (ref 0–5)
HEMATOCRIT: 44 % (ref 39.0–52.0)
Hemoglobin: 13.9 g/dL (ref 13.0–17.0)
LYMPHS ABS: 2.1 10*3/uL (ref 0.7–4.0)
Lymphocytes Relative: 18 % (ref 12–46)
MCH: 28 pg (ref 26.0–34.0)
MCHC: 31.6 g/dL (ref 30.0–36.0)
MCV: 88.7 fL (ref 78.0–100.0)
Monocytes Absolute: 1 10*3/uL (ref 0.1–1.0)
Monocytes Relative: 9 % (ref 3–12)
NEUTROS PCT: 69 % (ref 43–77)
Neutro Abs: 8.2 10*3/uL — ABNORMAL HIGH (ref 1.7–7.7)
Platelets: 425 10*3/uL — ABNORMAL HIGH (ref 150–400)
RBC: 4.96 MIL/uL (ref 4.22–5.81)
RDW: 15.6 % — AB (ref 11.5–15.5)
WBC: 11.6 10*3/uL — ABNORMAL HIGH (ref 4.0–10.5)

## 2014-03-07 MED ORDER — OXYCODONE-ACETAMINOPHEN 5-325 MG PO TABS
2.0000 | ORAL_TABLET | Freq: Once | ORAL | Status: AC
  Administered 2014-03-07: 2 via ORAL
  Filled 2014-03-07: qty 2

## 2014-03-07 NOTE — ED Notes (Signed)
Explained delay to family at the bedside.

## 2014-03-07 NOTE — Discharge Instructions (Signed)
Continue pain medications and keeping your leg elevated. Follow up with your doctor for recheck in the next 2 days.   Stump Care, Prosthesis Sometimes, an arm or leg must be removed (amputated). An artificial body part (prosthesis) can help you return to a more normal life. The prosthesis needs to be cared for, as does the end of your arm or leg. This is sometimes called a stump. Your caregiver might call it a residual limb. It will need some protection. Also, your skin will need special care. It takes time to get used to these activities. Try to be patient. Good stump care will help you be comfortable, active, and healthy. SKIN CARE  Each day, look closely at the skin on your stump.  Use a mirror with a long handle to check areas you cannot see or ask a friend or family member to look.  Check for areas that look irritated. They might be reddish or swollen. Make sure there are no open sores. If there are, call your caregiver.  Keep the skin of the stump clean and dry. You should:  Wash with a mild, antibacterial soap at least once a day. This is a special soap to fight germs. Wash more often if you get dirty or sweaty often.  Do not soak in a warm or hot bath for long periods (longer than 20 minutes).  Use a cotton towel to pat the stump dry. Let it air-dry for another 5 to 10 minutes.  Do not use lotions that contain petroleum jelly. Do not use skin care products with an alcohol base. These products can be harmful to your skin. They can also damage the lining of the prosthesis.  Put creams and lotions on your stump only if your caregiver says it is okay to do so.  Do not shave the stump. Hair that grows out after being shaved is more easily irritated by the prosthesis.  Consider using an antiperspirant spray on the skin of the stump.  Take care of the surgical scar. Your caregiver may instruct you to rub an ointment on it. This can keep the scar soft and help it heal.  Pay attention to  pressure points. These are places where the prosthesis and stump rub together. Even small contact areas can become sore. Ask your caregiver if adjustments need to be made. PROSTHESIS CARE How you take care of the prosthesis can affect the stump, too.  Make sure it is clean before you apply it.  Clean all the parts that touch your skin. This includes the prosthesis and padding. Use hot water and antibacterial soap. Make sure everything is dry before you attach the prosthesis.  Make sure the place where the stump fits into the prosthesis (socket) is clean and dry.  Be sure you understand how to attach the prosthesis. Your prosthetic specialist caregiver (prosthetist) can show you how to do this. It is a good idea to practice several times while he or she watches.  You may have been given wraps or socks to wear under the prosthesis. Make sure you use them. HOME CARE INSTRUCTIONS   Take any medicine that your caregiver has suggested. This may include pain medicine.  Some people have what is called phantom limb pain. You feel pain or tingling just like you would if the limb you lost was still there. Tell your caregiver about this. Treatment may be possible.  You might need to wear a compression stocking (elastic bandage or sock) when you are not  wearing the prosthesis. These are often called "stump shrinkers."  Limit the amount of time you wear your prosthesis. This is especially important at first. You may want to increase the time a little bit each day. Ask your caregiver for suggestions.  Learn how to sit and lie down in a way that will not cause changes in your stump or adjacent joints. Otherwise, this can affect how the prosthesis fits. For example, do not put pillows under the stump.  Do whatever exercises and movements your physical therapist recommended. This might include practicing everyday tasks.  Keep your weight stable. Losing or gaining weight will quickly change the way the  prosthesis fits.  It is okay to lose weight gradually. Just check with your prosthetist every so often to make sure the prosthesis still fits well.  Keep all follow-up appointments with both your primary caregiver and prosthetist. This is important to make sure the stump is healthy and the prosthesis is working as it should. SEEK MEDICAL CARE IF:   You have any questions about medicine.  Pain continues, even after taking medicine.  The prosthesis does not seem to fit correctly or it starts to rub unevenly.  The amount of padding between the prosthesis and the stump needs to be changed.  Sweating between the stump and the prosthesis is heavy and efforts to control it do not work.  An itchy rash develops on the stump. SEEK IMMEDIATE MEDICAL CARE IF:   Your residual limb feels markedly colder than the upper part of the limb.  A sore on the stump is not healing.  The stump is red, swollen, painful, or hot. These could be signs of an infection.  Your skin looks gray or black.  A bad smell develops around the stump. Document Released: 03/27/2009 Document Revised: 03/25/2011 Document Reviewed: 03/27/2009 [facility] Patient Information 2015 [place], [place]. This information is not intended to replace advice given to you by your health care provider. Make sure you discuss any questions you have with your health care provider.

## 2014-03-07 NOTE — Progress Notes (Signed)
Patient ID: Jack [name], male   DOB: 11-25-1945, 69 y.o.   MRN: [id number]    HISTORY AND PHYSICAL  Location:  [place] Room Number: 017 Place of Service: SNF (31)   Extended Emergency Contact Information Primary Emergency Contact: Rudder,Courtney Address: [address]          [place] [address] [place] Phone: [phone] Mobile Phone: [phone] Relation: Daughter Secondary Emergency Contact: Castilleja,Cori Address: [address]          [place], [place] [address] [place] Phone: [phone] Mobile Phone: [phone] Relation: Daughter  Advanced Directive information   no advanced directive  Chief Complaint  Patient presents with  . New Admit To SNF    Following hospitalization for osteomyelitis of the right    HPI:  Admitted to [facility] 02/25/14 following hospitalization.  Hospitalized 02/18/14 through 02/25/14. During hospitalization he had a BKA of the right lower leg for osteomyelitis of the right foot. He has had a prior left BKA in 2015 for similar circumstance of osteomyelitis.  Patient is end-stage renal disease on hemodialysis since March 2014. He is a complicated patient with additional diagnoses as noted below.  Status post below knee amputation of right lower extremity  Osteomyelitis: Continues on vancomycin and ceftazidime until 03/11/14  Chronic kidney disease (CKD), stage V: Continues on dialysis  Type 1 diabetes mellitus with diabetic foot infection: Diabetes under fairly good control. Recent right BKA.  Diabetes mellitus with peripheral vascular disease: Diabetic control satisfactory  Essential hypertension: Controlled  Hypothyroidism, unspecified hypothyroidism type: Compensated  End-stage renal disease on hemodialysis: Continues on dialysis  Decubitus ulcer of sacral region, stage 2: Present for 5 months according to the patient.  Glaucoma:  Continues on eyedrops  Anemia: Has been attributed both iron deficiency and to renal disease in the past. Recent hemoglobin is 10.8 g percent.  Gout of left foot, unspecified cause, unspecified chronicity    Past Medical History  Diagnosis Date  . Hypertension   . Pneumonia     2012  . Heart murmur   . Glaucoma   . Multiple myeloma, without mention of having achieved remission 03/30/2012    Cytogenetic neg on 03/23/2012.  . Asthma   . Hyperparathyroidism, secondary renal   . Peripheral arterial disease   . End-stage renal disease on hemodialysis     Started HD March 2014.  Cause of ESRD was DM.  Gets HD at [facility] on [place] on MWF schedule.   . Thyroid disease     hyperparathyroidism  . MRSA bacteremia   . Anemia   . Peripheral vascular disease, unspecified 11/19/2012    In the past had R foot toe amps then R TMA. In 2015 had left foot toe amp > then TMA >then L BKA on 05/14/13   . History of MRSA infection 04/22/2013    Bacteremia assoc w L foot wound infection Mar 2015   . Gangrene of foot   . ESRD on hemodialysis   . Diabetes mellitus with peripheral vascular disease   . Hypothyroidism     Past Surgical History  Procedure Laterality Date  . Thyroidectomy    . Cervical disc surgery    . Insertion of dialysis catheter Right 03/19/2012    Procedure: INSERTION OF DIALYSIS CATHETER;  Surgeon: Mal Misty, MD;  Location: [place];  Service: Vascular;  Laterality: Right;  Right Internal Jugular  . Av fistula  placement Left 03/25/2012    Procedure: ARTERIOVENOUS (AV) FISTULA CREATION;  Surgeon: Mal Misty, MD;  Location: [place];  Service: Vascular;  Laterality: Left;  . Ligation of competing branches of arteriovenous fistula Left 05/08/2012    Procedure: LIGATION OF COMPETING BRANCHES OF ARTERIOVENOUS FISTULA;  Surgeon: Mal Misty, MD;  Location: [place];  Service: Vascular;  Laterality: Left;  Ultrasound guided  . Cardiac catheterization      approx 30 years ago  .  Amputation Right 11/10/2012    Procedure: AMPUTATION FIRST and SECOND TOES Right Foot;  Surgeon: Elam Dutch, MD;  Location: [facility] OR;  Service: Vascular;  Laterality: Right;  . Transmetatarsal amputation Left 12/16/2012    Procedure: TRANSMETATARSAL AMPUTATION AND VAC PLACEMENT;  Surgeon: Elam Dutch, MD;  Location: [place];  Service: Vascular;  Laterality: Left;  . Amputation Left 04/07/2013    Procedure: AMPUTATION DIGIT- LEFT 1ST TOE;  Surgeon: Mal Misty, MD;  Location: [place];  Service: Vascular;  Laterality: Left;  . Tee without cardioversion N/A 04/20/2013    Procedure: TRANSESOPHAGEAL ECHOCARDIOGRAM (TEE);  Surgeon: Josue Hector, MD;  Location: [facility] ENDOSCOPY;  Service: Cardiovascular;  Laterality: N/A;  . I&d extremity Left 04/22/2013    Procedure: [id number] KCL DEBRIDEMENT LEFT FIRST TOE AMPUTATION WOUND ;  Surgeon: Mal Misty, MD;  Location: [place];  Service: Vascular;  Laterality: Left;  . Amputation Left 04/26/2013    Procedure: Left Foot Transmetatarsal Amputation;  Surgeon: Newt Minion, MD;  Location: [place];  Service: Orthopedics;  Laterality: Left;  . Toe amputation      D/C 04-30-13  . Below knee leg amputation Left 05/14/2013    DR [name]  . Amputation Left 05/14/2013    Procedure: AMPUTATION BELOW KNEE;  Surgeon: Newt Minion, MD;  Location: [place];  Service: Orthopedics;  Laterality: Left;  Left Below Knee Amputation  . Eye surgery Bilateral     CATARACTS  . Amputation Left 07/23/2013    Procedure: AMPUTATION BELOW KNEE;  Surgeon: Newt Minion, MD;  Location: [place];  Service: Orthopedics;  Laterality: Left;  Left Below Knee Amputation Revision  . Abdominal aortagram Bilateral 11/06/2012    Procedure: ABDOMINAL AORTAGRAM;  Surgeon: Elam Dutch, MD;  Location: [facility] CATH LAB;  Service: Cardiovascular;  Laterality: Bilateral;  . I&d extremity Right 01/05/2014    Procedure: IRRIGATION AND DEBRIDEMENT Right Heel Ulcer;  Surgeon: Mcarthur Rossetti, MD;  Location: [place];  Service: Orthopedics;  Laterality: Right;  Surgeon Available after 5PM  . Below knee leg amputation Right 02/18/2014    dr duda  . Amputation Right 02/18/2014    Procedure: AMPUTATION BELOW KNEE;  Surgeon: Newt Minion, MD;  Location: [place];  Service: Orthopedics;  Laterality: Right;    Patient Care Team: Lucianne Lei, MD as PCP - General (Family Medicine) Donato Heinz, MD (Nephrology) Newt Minion, MD as Consulting Physician (Orthopedic Surgery) Sol Blazing, MD as Consulting Physician (Nephrology) Angelia Mould, MD as Consulting Physician (Vascular Surgery)  History   Social History  . Marital Status: Divorced    Spouse Name: N/A  . Number of Children: N/A  . Years of Education: N/A   Occupational History  . Not on file.   Social History Main Topics  . Smoking status: Former Smoker    Types: Cigarettes    Quit date: 03/19/1982  . Smokeless tobacco: Never Used  . Alcohol Use: No     Comment:  formerly  . Drug Use: No  . Sexual Activity: No   Other Topics Concern  . Not on file   Social History Narrative     reports that he quit smoking about 31 years ago. His smoking use included Cigarettes. He has never used smokeless tobacco. He reports that he does not drink alcohol or use illicit drugs.  Family History  Problem Relation Age of Onset  . Diabetes Mother   . Cancer Mother     bone   . Kidney disease Father    Family Status  Relation Status Death Age  . Mother Deceased   . Father Deceased     Immunization History  Administered Date(s) Administered  . Influenza,inj,Quad PF,36+ Mos 09/21/2013  . Pneumococcal-Unspecified 05/17/2012    Allergies  Allergen Reactions  . Bee Venom Anaphylaxis  . Lisinopril Cough  . Ivp Dye [Iodinated Diagnostic Agents] Hives  . Morphine And Related Hives and Other (See Comments)    Bradycardia states patient    Medications: Patient's Medications  New Prescriptions   No medications on file    Previous Medications   ASPIRIN EC 81 MG TABLET    Take 81 mg by mouth daily.   B COMPLEX-VITAMIN C-FOLIC ACID (NEPHRO-VITE) 0.8 MG TABS TABLET    Take 1 tablet by mouth daily.   CALCIUM ACETATE (PHOSLO) 667 MG CAPSULE    Take 1,334 mg by mouth 3 (three) times daily with meals.   CEFTAZIDIME 2 G IN DEXTROSE 5 % 50 ML    Inject 2 g into the vein every Saturday with hemodialysis. One dose per holiday schedule   CEFTAZIDIME 2 G IN DEXTROSE 5 % 50 ML    Inject 2 g into the vein every Monday, Wednesday, and Friday with hemodialysis. X 2 weeks   COLCHICINE 0.6 MG TABLET    Take 1 tablet (0.6 mg total) by mouth 2 (two) times a week.   FEEDING SUPPLEMENT, RESOURCE BREEZE, (RESOURCE BREEZE) LIQD    Take 1 Container by mouth 3 (three) times daily between meals.   GABAPENTIN (NEURONTIN) 100 MG CAPSULE    Take 2 capsules (200 mg total) by mouth at bedtime.   LANTHANUM (FOSRENOL) 1000 MG CHEWABLE TABLET    Chew 1,000 mg by mouth 3 (three) times daily with meals.   LEVOTHYROXINE (SYNTHROID, LEVOTHROID) 175 MCG TABLET    Take 175 mcg by mouth daily before breakfast.   MEGESTROL (MEGACE) 40 MG TABLET    Take 40 mg by mouth daily.   MULTIVITAMIN (RENA-VIT) TABS TABLET    Take 1 tablet by mouth at bedtime.   OXYCODONE HCL 10 MG TABS    Take 0.5-1 tablets (5-10 mg total) by mouth every 6 (six) hours as needed (for severe pain).   OXYCODONE-ACETAMINOPHEN (ROXICET) 5-325 MG PER TABLET    Take 1 tablet by mouth every 4 (four) hours as needed for severe pain.   PROMETHAZINE (PHENERGAN) 12.5 MG TABLET    Take 1 tablet (12.5 mg total) by mouth every 6 (six) hours as needed for nausea or vomiting.   VANCOMYCIN (VANCOCIN) 750 MG/150ML SOLN    Inject 150 mLs (750 mg total) into the vein every Monday, Wednesday, and Friday with hemodialysis. X 2 weeks  Modified Medications   No medications on file  Discontinued Medications   No medications on file    Review of Systems  Constitutional: Negative for fever, activity  change, appetite change, fatigue and unexpected weight change.       Thin,  chronically ill appearance  HENT: Negative for congestion, ear pain, hearing loss, rhinorrhea, sore throat, tinnitus, trouble swallowing and voice change.   Eyes:       Corrective lenses. Cataract surgery about one month ago in the right eye.  Respiratory: Negative for cough, choking, chest tightness, shortness of breath and wheezing.   Cardiovascular: Negative for chest pain, palpitations and leg swelling.  Gastrointestinal: Negative for nausea, abdominal pain, diarrhea, constipation and abdominal distention.  Endocrine: Negative for cold intolerance, heat intolerance, polydipsia, polyphagia and polyuria.       Diabetes mellitus. Hypothyroid. History of one gout attack.  Genitourinary: Negative for dysuria, urgency, frequency and testicular pain.       Not incontinent. End-stage renal disease on hemodialysis.  Musculoskeletal: Negative for myalgias, back pain, arthralgias, gait problem and neck pain.       Bilateral BKA.  Skin: Negative for color change, pallor and rash.       Sacral decubitus ulcer present for 5 months.  Allergic/Immunologic: Negative.   Neurological: Negative for dizziness, tremors, syncope, speech difficulty, weakness, numbness and headaches.  Hematological: Negative for adenopathy. Does not bruise/bleed easily.       Patient carries a diagnosis of multiple myeloma, but I am not sure as to how this diagnosis was made. There are other references to [facility] disease and to MGUS.  Psychiatric/Behavioral: Negative for hallucinations, behavioral problems, confusion, sleep disturbance and decreased concentration. The patient is not nervous/anxious.     Filed Vitals:   03/07/14 1640  BP: 137/63  Pulse: 80  Temp: 98.6 F (37 C)  Resp: 16  Height: '5\' 10"'  (1.778 m)  Weight: 149 lb (67.586 kg)  SpO2: 98%   Body mass index is 21.38 kg/(m^2).  Physical Exam  Constitutional: He is oriented to  person, place, and time. No distress.  Thin and chronically ill in appearance.  HENT:  Right Ear: External ear normal.  Left Ear: External ear normal.  Nose: Nose normal.  Mouth/Throat: Oropharynx is clear and moist. No oropharyngeal exudate.  Eyes: Conjunctivae and EOM are normal. Pupils are equal, round, and reactive to light.  Neck: No JVD present. No tracheal deviation present. No thyromegaly present.  Cardiovascular: Normal rate, regular rhythm and intact distal pulses.  Exam reveals no gallop and no friction rub.   No murmur heard. 3/6 systolic ejection murmur left sternal border  Pulmonary/Chest: No respiratory distress. He has no wheezes. He has no rales. He exhibits no tenderness.  Abdominal: He exhibits no distension and no mass. There is no tenderness.  Musculoskeletal: Normal range of motion. He exhibits no edema or tenderness.  Bilateral BKA  Lymphadenopathy:    He has no cervical adenopathy.  Neurological: He is alert and oriented to person, place, and time. He has normal reflexes. No cranial nerve deficit. Coordination normal.  Skin: No rash noted. No erythema. No pallor.  Stage II sacral decubitus approximately 3 x 4 cm diameter.  Psychiatric: He has a normal mood and affect. His behavior is normal. Judgment and thought content normal.     Labs reviewed: Admission on 02/18/2014, Discharged on 02/25/2014  Component Date Value Ref Range Status  . Sodium 02/18/2014 137  135 - 145 mmol/L Final  . Potassium 02/18/2014 3.7  3.5 - 5.1 mmol/L Final  . Glucose, Bld 02/18/2014 135* 70 - 99 mg/dL Final  . HCT 02/18/2014 37.0* 39.0 - 52.0 % Final  . Hemoglobin 02/18/2014 12.6* 13.0 - 17.0 g/dL Final  . Glucose-Capillary 02/18/2014 100* 70 -  99 mg/dL Final  . Glucose-Capillary 02/18/2014 88  70 - 99 mg/dL Final  . Comment 1 02/18/2014 Documented in Chart   Final  . Comment 2 02/18/2014 Notify RN   Final  . Glucose-Capillary 02/18/2014 109* 70 - 99 mg/dL Final  .  Glucose-Capillary 02/19/2014 112* 70 - 99 mg/dL Final  . Comment 1 02/19/2014 Notify RN   Final  . Comment 2 02/19/2014 Documented in Chart   Final  . Glucose-Capillary 02/19/2014 137* 70 - 99 mg/dL Final  . Glucose-Capillary 02/19/2014 208* 70 - 99 mg/dL Final  . Comment 1 02/19/2014 Notify RN   Final  . Comment 2 02/19/2014 Documented in Chart   Final  . Glucose-Capillary 02/19/2014 155* 70 - 99 mg/dL Final  . Glucose-Capillary 02/20/2014 96  70 - 99 mg/dL Final  . Comment 1 02/20/2014 Notify RN   Final  . Comment 2 02/20/2014 Documented in Chart   Final  . Glucose-Capillary 02/20/2014 152* 70 - 99 mg/dL Final  . Comment 1 02/20/2014 Notify RN   Final  . Comment 2 02/20/2014 Documented in Chart   Final  . Glucose-Capillary 02/20/2014 141* 70 - 99 mg/dL Final  . Glucose-Capillary 02/20/2014 121* 70 - 99 mg/dL Final  . WBC 02/21/2014 9.8  4.0 - 10.5 K/uL Final  . RBC 02/21/2014 3.27* 4.22 - 5.81 MIL/uL Final  . Hemoglobin 02/21/2014 8.5* 13.0 - 17.0 g/dL Final  . HCT 02/21/2014 27.5* 39.0 - 52.0 % Final  . MCV 02/21/2014 84.1  78.0 - 100.0 fL Final  . MCH 02/21/2014 26.0  26.0 - 34.0 pg Final  . MCHC 02/21/2014 30.9  30.0 - 36.0 g/dL Final  . RDW 02/21/2014 14.8  11.5 - 15.5 % Final  . Platelets 02/21/2014 238  150 - 400 K/uL Final  . Sodium 02/21/2014 136  135 - 145 mmol/L Final  . Potassium 02/21/2014 4.7  3.5 - 5.1 mmol/L Final  . Chloride 02/21/2014 98  96 - 112 mmol/L Final  . CO2 02/21/2014 25  19 - 32 mmol/L Final  . Glucose, Bld 02/21/2014 120* 70 - 99 mg/dL Final  . BUN 02/21/2014 43* 6 - 23 mg/dL Final  . Creatinine, Ser 02/21/2014 8.37* 0.50 - 1.35 mg/dL Final  . Calcium 02/21/2014 8.6  8.4 - 10.5 mg/dL Final  . Phosphorus 02/21/2014 4.1  2.3 - 4.6 mg/dL Final  . Albumin 02/21/2014 2.3* 3.5 - 5.2 g/dL Final  . GFR calc non Af Amer 02/21/2014 6* >90 mL/min Final  . GFR calc Af Amer 02/21/2014 7* >90 mL/min Final   Comment: (NOTE) The eGFR has been calculated using the  CKD EPI equation. This calculation has not been validated in all clinical situations. eGFR's persistently <90 mL/min signify possible Chronic Kidney Disease.   . Anion gap 02/21/2014 13  5 - 15 Final  . Hepatitis B Surface Ag 02/21/2014 NEGATIVE  NEGATIVE Final   Performed at [facility]  . Glucose-Capillary 02/21/2014 104* 70 - 99 mg/dL Final  . Glucose-Capillary 02/21/2014 132* 70 - 99 mg/dL Final  . Glucose-Capillary 02/21/2014 133* 70 - 99 mg/dL Final  . WBC 02/23/2014 8.5  4.0 - 10.5 K/uL Final  . RBC 02/23/2014 3.01* 4.22 - 5.81 MIL/uL Final  . Hemoglobin 02/23/2014 8.0* 13.0 - 17.0 g/dL Final  . HCT 02/23/2014 26.3* 39.0 - 52.0 % Final  . MCV 02/23/2014 87.4  78.0 - 100.0 fL Final  . MCH 02/23/2014 26.6  26.0 - 34.0 pg Final  . MCHC 02/23/2014 30.4  30.0 - 36.0 g/dL Final  . RDW 02/23/2014 15.0  11.5 - 15.5 % Final  . Platelets 02/23/2014 252  150 - 400 K/uL Final  . Sodium 02/23/2014 139  135 - 145 mmol/L Final  . Potassium 02/23/2014 4.5  3.5 - 5.1 mmol/L Final  . Chloride 02/23/2014 101  96 - 112 mmol/L Final  . CO2 02/23/2014 27  19 - 32 mmol/L Final  . Glucose, Bld 02/23/2014 120* 70 - 99 mg/dL Final  . BUN 02/23/2014 40* 6 - 23 mg/dL Final  . Creatinine, Ser 02/23/2014 7.26* 0.50 - 1.35 mg/dL Final  . Calcium 02/23/2014 9.1  8.4 - 10.5 mg/dL Final  . Phosphorus 02/23/2014 3.6  2.3 - 4.6 mg/dL Final  . Albumin 02/23/2014 2.2* 3.5 - 5.2 g/dL Final  . GFR calc non Af Amer 02/23/2014 7* >90 mL/min Final  . GFR calc Af Amer 02/23/2014 8* >90 mL/min Final   Comment: (NOTE) The eGFR has been calculated using the CKD EPI equation. This calculation has not been validated in all clinical situations. eGFR's persistently <90 mL/min signify possible Chronic Kidney Disease.   . Anion gap 02/23/2014 11  5 - 15 Final  . Glucose-Capillary 02/23/2014 168* 70 - 99 mg/dL Final  . Glucose-Capillary 02/24/2014 153* 70 - 99 mg/dL Final  . WBC 02/25/2014 11.8* 4.0 - 10.5 K/uL  Final  . RBC 02/25/2014 2.98* 4.22 - 5.81 MIL/uL Final  . Hemoglobin 02/25/2014 7.8* 13.0 - 17.0 g/dL Final  . HCT 02/25/2014 25.9* 39.0 - 52.0 % Final  . MCV 02/25/2014 86.9  78.0 - 100.0 fL Final  . MCH 02/25/2014 26.2  26.0 - 34.0 pg Final  . MCHC 02/25/2014 30.1  30.0 - 36.0 g/dL Final  . RDW 02/25/2014 15.0  11.5 - 15.5 % Final  . Platelets 02/25/2014 270  150 - 400 K/uL Final  . Sodium 02/25/2014 135  135 - 145 mmol/L Final  . Potassium 02/25/2014 3.7  3.5 - 5.1 mmol/L Final   DELTA CHECK NOTED  . Chloride 02/25/2014 96  96 - 112 mmol/L Final  . CO2 02/25/2014 27  19 - 32 mmol/L Final  . Glucose, Bld 02/25/2014 187* 70 - 99 mg/dL Final  . BUN 02/25/2014 30* 6 - 23 mg/dL Final  . Creatinine, Ser 02/25/2014 6.42* 0.50 - 1.35 mg/dL Final  . Calcium 02/25/2014 9.3  8.4 - 10.5 mg/dL Final  . Phosphorus 02/25/2014 2.9  2.3 - 4.6 mg/dL Final  . Albumin 02/25/2014 2.2* 3.5 - 5.2 g/dL Final  . GFR calc non Af Amer 02/25/2014 8* >90 mL/min Final  . GFR calc Af Amer 02/25/2014 9* >90 mL/min Final   Comment: (NOTE) The eGFR has been calculated using the CKD EPI equation. This calculation has not been validated in all clinical situations. eGFR's persistently <90 mL/min signify possible Chronic Kidney Disease.   . Anion gap 02/25/2014 12  5 - 15 Final  . Order Confirmation 02/25/2014 ORDER PROCESSED BY BLOOD BANK   Final  . ABO/RH(D) 02/25/2014 B POS   Final  . Antibody Screen 02/25/2014 NEG   Final  . Sample Expiration 02/25/2014 02/28/2014   Final  . Unit Number 02/25/2014 [id number]   Final  . Blood Component Type 02/25/2014 RED CELLS,LR   Final  . Unit division 02/25/2014 00   Final  . Status of Unit 02/25/2014 ISSUED,FINAL   Final  . Transfusion Status 02/25/2014 OK TO TRANSFUSE   Final  . Crossmatch Result 02/25/2014 Compatible   Final  .  Unit Number 02/25/2014 [id number]   Final  . Blood Component Type 02/25/2014 RED CELLS,LR   Final  . Unit division 02/25/2014 00    Final  . Status of Unit 02/25/2014 ISSUED,FINAL   Final  . Transfusion Status 02/25/2014 OK TO TRANSFUSE   Final  . Crossmatch Result 02/25/2014 Compatible   Final  Hospital Outpatient Visit on 02/17/2014  Component Date Value Ref Range Status  . Glucose-Capillary 02/17/2014 160* 70 - 99 mg/dL Final  . aPTT 02/17/2014 33  24 - 37 seconds Final  . WBC 02/17/2014 9.1  4.0 - 10.5 K/uL Final  . RBC 02/17/2014 4.16* 4.22 - 5.81 MIL/uL Final  . Hemoglobin 02/17/2014 10.8* 13.0 - 17.0 g/dL Final  . HCT 02/17/2014 35.3* 39.0 - 52.0 % Final  . MCV 02/17/2014 84.9  78.0 - 100.0 fL Final  . MCH 02/17/2014 26.0  26.0 - 34.0 pg Final  . MCHC 02/17/2014 30.6  30.0 - 36.0 g/dL Final  . RDW 02/17/2014 14.8  11.5 - 15.5 % Final  . Platelets 02/17/2014 307  150 - 400 K/uL Final  . Sodium 02/17/2014 139  135 - 145 mmol/L Final  . Potassium 02/17/2014 4.4  3.5 - 5.1 mmol/L Final  . Chloride 02/17/2014 99  96 - 112 mmol/L Final  . CO2 02/17/2014 30  19 - 32 mmol/L Final  . Glucose, Bld 02/17/2014 189* 70 - 99 mg/dL Final  . BUN 02/17/2014 20  6 - 23 mg/dL Final  . Creatinine, Ser 02/17/2014 6.18* 0.50 - 1.35 mg/dL Final  . Calcium 02/17/2014 9.5  8.4 - 10.5 mg/dL Final  . Total Protein 02/17/2014 7.1  6.0 - 8.3 g/dL Final  . Albumin 02/17/2014 3.0* 3.5 - 5.2 g/dL Final  . AST 02/17/2014 15  0 - 37 U/L Final  . ALT 02/17/2014 16  0 - 53 U/L Final  . Alkaline Phosphatase 02/17/2014 86  39 - 117 U/L Final  . Total Bilirubin 02/17/2014 0.5  0.3 - 1.2 mg/dL Final  . GFR calc non Af Amer 02/17/2014 8* >90 mL/min Final  . GFR calc Af Amer 02/17/2014 10* >90 mL/min Final   Comment: (NOTE) The eGFR has been calculated using the CKD EPI equation. This calculation has not been validated in all clinical situations. eGFR's persistently <90 mL/min signify possible Chronic Kidney Disease.   . Anion gap 02/17/2014 10  5 - 15 Final  . Prothrombin Time 02/17/2014 13.9  11.6 - 15.2 seconds Final  . INR 02/17/2014  1.06  0.00 - 1.49 Final  . Hgb A1c MFr Bld 02/17/2014 6.9* 4.8 - 5.6 % Final   Comment: (NOTE)         Pre-diabetes: 5.7 - 6.4         Diabetes: >6.4         Glycemic control for adults with diabetes: <7.0   . Mean Plasma Glucose 02/17/2014 151   Final   Comment: (NOTE) Performed At: [facility] [place], [place] [id number] Lindon Romp MD [id number]   Office Visit on 01/17/2014  Component Date Value Ref Range Status  . Glucose-Capillary 01/17/2014 181* 70 - 99 mg/dL Final  . Glucose-Capillary 01/17/2014 168* 70 - 99 mg/dL Final  . Glucose-Capillary 01/18/2014 165* 70 - 99 mg/dL Final  . Glucose-Capillary 01/18/2014 194* 70 - 99 mg/dL Final  . Glucose-Capillary 01/25/2014 121* 70 - 99 mg/dL Final  . Glucose-Capillary 01/25/2014 150* 70 - 99 mg/dL Final  . Glucose-Capillary 01/26/2014 139* 70 -  99 mg/dL Final  . Glucose-Capillary 01/26/2014 125* 70 - 99 mg/dL Final  . Glucose-Capillary 01/26/2014 189* 70 - 99 mg/dL Final  . Glucose-Capillary 01/27/2014 135* 70 - 99 mg/dL Final  . Glucose-Capillary 01/27/2014 225* 70 - 99 mg/dL Final  . Glucose-Capillary 01/31/2014 251* 70 - 99 mg/dL Final  . Glucose-Capillary 01/31/2014 186* 70 - 99 mg/dL Final  . Glucose-Capillary 02/01/2014 278* 70 - 99 mg/dL Final  . Glucose-Capillary 02/01/2014 179* 70 - 99 mg/dL Final  . Glucose-Capillary 02/02/2014 116* 70 - 99 mg/dL Final  . Glucose-Capillary 02/02/2014 208* 70 - 99 mg/dL Final  . Glucose-Capillary 02/08/2014 143* 70 - 99 mg/dL Final  . Glucose-Capillary 02/10/2014 114* 70 - 99 mg/dL Final  . Glucose-Capillary 02/10/2014 104* 70 - 99 mg/dL Final  . Glucose-Capillary 02/11/2014 247* 70 - 99 mg/dL Final  . Glucose-Capillary 02/11/2014 133* 70 - 99 mg/dL Final  Admission on 01/04/2014, Discharged on 01/06/2014  Component Date Value Ref Range Status  . WBC 01/04/2014 9.9  4.0 - 10.5 K/uL Final  . RBC 01/04/2014 4.84  4.22 - 5.81 MIL/uL Final  . Hemoglobin  01/04/2014 13.5  13.0 - 17.0 g/dL Final  . HCT 01/04/2014 41.8  39.0 - 52.0 % Final  . MCV 01/04/2014 86.4  78.0 - 100.0 fL Final  . MCH 01/04/2014 27.9  26.0 - 34.0 pg Final  . MCHC 01/04/2014 32.3  30.0 - 36.0 g/dL Final  . RDW 01/04/2014 14.0  11.5 - 15.5 % Final  . Platelets 01/04/2014 290  150 - 400 K/uL Final  . Neutrophils Relative % 01/04/2014 80* 43 - 77 % Final  . Neutro Abs 01/04/2014 7.9* 1.7 - 7.7 K/uL Final  . Lymphocytes Relative 01/04/2014 12  12 - 46 % Final  . Lymphs Abs 01/04/2014 1.2  0.7 - 4.0 K/uL Final  . Monocytes Relative 01/04/2014 7  3 - 12 % Final  . Monocytes Absolute 01/04/2014 0.7  0.1 - 1.0 K/uL Final  . Eosinophils Relative 01/04/2014 1  0 - 5 % Final  . Eosinophils Absolute 01/04/2014 0.1  0.0 - 0.7 K/uL Final  . Basophils Relative 01/04/2014 0  0 - 1 % Final  . Basophils Absolute 01/04/2014 0.0  0.0 - 0.1 K/uL Final  . Sodium 01/04/2014 138  135 - 145 mmol/L Final   Please note change in reference range.  . Potassium 01/04/2014 4.5  3.5 - 5.1 mmol/L Final   Please note change in reference range.  . Chloride 01/04/2014 92* 96 - 112 mEq/L Final  . CO2 01/04/2014 27  19 - 32 mmol/L Final  . Glucose, Bld 01/04/2014 139* 70 - 99 mg/dL Final  . BUN 01/04/2014 48* 6 - 23 mg/dL Final  . Creatinine, Ser 01/04/2014 10.98* 0.50 - 1.35 mg/dL Final  . Calcium 01/04/2014 9.8  8.4 - 10.5 mg/dL Final  . GFR calc non Af Amer 01/04/2014 4* >90 mL/min Final  . GFR calc Af Amer 01/04/2014 5* >90 mL/min Final   Comment: (NOTE) The eGFR has been calculated using the CKD EPI equation. This calculation has not been validated in all clinical situations. eGFR's persistently <90 mL/min signify possible Chronic Kidney Disease.   . Anion gap 01/04/2014 19* 5 - 15 Final  . Specimen Description 01/04/2014 ULCER   Final  . Special Requests 01/04/2014 Normal   Final  . Gram Stain 01/04/2014    Final  Value:NO WBC SEEN NO SQUAMOUS EPITHELIAL CELLS  SEEN MODERATE GRAM POSITIVE COCCI IN PAIRS IN CLUSTERS Performed at [facility]   . Culture 01/04/2014    Final                   Value:ABUNDANT STAPHYLOCOCCUS AUREUS Note: RIFAMPIN AND GENTAMICIN SHOULD NOT BE USED AS SINGLE DRUGS FOR TREATMENT OF STAPH INFECTIONS. ABUNDANT GROUP B STREP(S.AGALACTIAE)ISOLATED Note: TESTING AGAINST S. AGALACTIAE NOT ROUTINELY PERFORMED DUE TO PREDICTABILITY OF AMP/PEN/VAN SUSCEPTIBILITY. Performed at [facility]   . Report Status 01/04/2014 01/08/2014 FINAL   Final  . Organism ID, Bacteria 01/04/2014 STAPHYLOCOCCUS AUREUS   Final  . Specimen Description 01/04/2014 BLOOD RIGHT ARM   Final  . Special Requests 01/04/2014 BOTTLES DRAWN AEROBIC AND ANAEROBIC 5CC   Final  . Culture  Setup Time 01/04/2014    Final                   Value:01/04/2014 21:16 Performed at [facility]   . Culture 01/04/2014    Final                   Value:NO GROWTH 5 DAYS Performed at [facility]   . Report Status 01/04/2014 01/10/2014 FINAL   Final  . Specimen Description 01/04/2014 BLOOD RIGHT HAND   Final  . Special Requests 01/04/2014 BOTTLES DRAWN AEROBIC ONLY 3CC   Final  . Culture  Setup Time 01/04/2014    Final                   Value:01/04/2014 21:18 Performed at [facility]   . Culture 01/04/2014    Final                   Value:NO GROWTH 5 DAYS Performed at [facility]   . Report Status 01/04/2014 01/10/2014 FINAL   Final  . Sed Rate 01/04/2014 69* 0 - 16 mm/hr Final  . CRP 01/04/2014 8.1* <0.60 mg/dL Final   Performed at [facility]  . WBC 01/04/2014 10.5  4.0 - 10.5 K/uL Final  . RBC 01/04/2014 4.46  4.22 - 5.81 MIL/uL Final  . Hemoglobin 01/04/2014 11.7* 13.0 - 17.0 g/dL Final  . HCT 01/04/2014 37.5* 39.0 - 52.0 % Final  . MCV 01/04/2014 84.1  78.0 - 100.0 fL Final  . MCH 01/04/2014 26.2  26.0 - 34.0 pg Final  . MCHC 01/04/2014 31.2  30.0 - 36.0 g/dL Final  . RDW 01/04/2014 14.0  11.5 -  15.5 % Final  . Platelets 01/04/2014 274  150 - 400 K/uL Final  . Creatinine, Ser 01/04/2014 11.17* 0.50 - 1.35 mg/dL Final  . GFR calc non Af Amer 01/04/2014 4* >90 mL/min Final  . GFR calc Af Amer 01/04/2014 5* >90 mL/min Final   Comment: (NOTE) The eGFR has been calculated using the CKD EPI equation. This calculation has not been validated in all clinical situations. eGFR's persistently <90 mL/min signify possible Chronic Kidney Disease.   . Sodium 01/05/2014 134* 135 - 145 mmol/L Final   Please note change in reference range.  . Potassium 01/05/2014 5.1  3.5 - 5.1 mmol/L Final   Please note change in reference range.  . Chloride 01/05/2014 91* 96 - 112 mEq/L Final  . CO2 01/05/2014 27  19 - 32 mmol/L Final  . Glucose, Bld 01/05/2014 78  70 - 99 mg/dL Final  . BUN 01/05/2014 56* 6 - 23 mg/dL Final  .  Creatinine, Ser 01/05/2014 11.69* 0.50 - 1.35 mg/dL Final  . Calcium 01/05/2014 8.7  8.4 - 10.5 mg/dL Final  . Total Protein 01/05/2014 6.1  6.0 - 8.3 g/dL Final  . Albumin 01/05/2014 2.7* 3.5 - 5.2 g/dL Final  . AST 01/05/2014 20  0 - 37 U/L Final  . ALT 01/05/2014 12  0 - 53 U/L Final  . Alkaline Phosphatase 01/05/2014 71  39 - 117 U/L Final  . Total Bilirubin 01/05/2014 0.9  0.3 - 1.2 mg/dL Final  . GFR calc non Af Amer 01/05/2014 4* >90 mL/min Final  . GFR calc Af Amer 01/05/2014 4* >90 mL/min Final   Comment: (NOTE) The eGFR has been calculated using the CKD EPI equation. This calculation has not been validated in all clinical situations. eGFR's persistently <90 mL/min signify possible Chronic Kidney Disease.   . Anion gap 01/05/2014 16* 5 - 15 Final  . WBC 01/05/2014 6.6  4.0 - 10.5 K/uL Final  . RBC 01/05/2014 4.05* 4.22 - 5.81 MIL/uL Final  . Hemoglobin 01/05/2014 10.6* 13.0 - 17.0 g/dL Final  . HCT 01/05/2014 34.3* 39.0 - 52.0 % Final  . MCV 01/05/2014 84.7  78.0 - 100.0 fL Final  . MCH 01/05/2014 26.2  26.0 - 34.0 pg Final  . MCHC 01/05/2014 30.9  30.0 - 36.0 g/dL  Final  . RDW 01/05/2014 14.1  11.5 - 15.5 % Final  . Platelets 01/05/2014 253  150 - 400 K/uL Final  . Neutrophils Relative % 01/05/2014 63  43 - 77 % Final  . Neutro Abs 01/05/2014 4.2  1.7 - 7.7 K/uL Final  . Lymphocytes Relative 01/05/2014 22  12 - 46 % Final  . Lymphs Abs 01/05/2014 1.4  0.7 - 4.0 K/uL Final  . Monocytes Relative 01/05/2014 12  3 - 12 % Final  . Monocytes Absolute 01/05/2014 0.8  0.1 - 1.0 K/uL Final  . Eosinophils Relative 01/05/2014 3  0 - 5 % Final  . Eosinophils Absolute 01/05/2014 0.2  0.0 - 0.7 K/uL Final  . Basophils Relative 01/05/2014 0  0 - 1 % Final  . Basophils Absolute 01/05/2014 0.0  0.0 - 0.1 K/uL Final  . Hgb A1c MFr Bld 01/04/2014 7.5* <5.7 % Final   Comment: (NOTE)                                                                       According to the ADA Clinical Practice Recommendations for 2011, when HbA1c is used as a screening test:  >=6.5%   Diagnostic of Diabetes Mellitus           (if abnormal result is confirmed) 5.7-6.4%   Increased risk of developing Diabetes Mellitus References:Diagnosis and Classification of Diabetes Mellitus,Diabetes ATFT,7322,02(RKYHC 1):S62-S69 and Standards of Medical Care in         Diabetes - 2011,Diabetes WCBJ,6283,15 (Suppl 1):S11-S61.   . Mean Plasma Glucose 01/04/2014 169* <117 mg/dL Final   Performed at [facility]  . Glucose-Capillary 01/04/2014 107* 70 - 99 mg/dL Final  . MRSA by PCR 01/04/2014 NEGATIVE  NEGATIVE Final   Comment:        The GeneXpert MRSA Assay (FDA approved for NASAL specimens only), is one component of a comprehensive MRSA  colonization surveillance program. It is not intended to diagnose MRSA infection nor to guide or monitor treatment for MRSA infections.   . Glucose-Capillary 01/05/2014 169* 70 - 99 mg/dL Final  . Glucose-Capillary 01/05/2014 89  70 - 99 mg/dL Final  . Glucose-Capillary 01/05/2014 84  70 - 99 mg/dL Final  . Sodium 01/06/2014 133* 135 - 145 mmol/L  Final   Please note change in reference range.  . Potassium 01/06/2014 4.4  3.5 - 5.1 mmol/L Final   Please note change in reference range.  . Chloride 01/06/2014 96  96 - 112 mEq/L Final  . CO2 01/06/2014 25  19 - 32 mmol/L Final  . Glucose, Bld 01/06/2014 115* 70 - 99 mg/dL Final  . BUN 01/06/2014 30* 6 - 23 mg/dL Final   DELTA CHECK NOTED  . Creatinine, Ser 01/06/2014 7.86* 0.50 - 1.35 mg/dL Final  . Calcium 01/06/2014 8.4  8.4 - 10.5 mg/dL Final  . Total Protein 01/06/2014 6.1  6.0 - 8.3 g/dL Final  . Albumin 01/06/2014 2.6* 3.5 - 5.2 g/dL Final  . AST 01/06/2014 11  0 - 37 U/L Final  . ALT 01/06/2014 9  0 - 53 U/L Final  . Alkaline Phosphatase 01/06/2014 63  39 - 117 U/L Final  . Total Bilirubin 01/06/2014 0.6  0.3 - 1.2 mg/dL Final  . GFR calc non Af Amer 01/06/2014 6* >90 mL/min Final  . GFR calc Af Amer 01/06/2014 7* >90 mL/min Final   Comment: (NOTE) The eGFR has been calculated using the CKD EPI equation. This calculation has not been validated in all clinical situations. eGFR's persistently <90 mL/min signify possible Chronic Kidney Disease.   . Anion gap 01/06/2014 12  5 - 15 Final  . WBC 01/06/2014 6.2  4.0 - 10.5 K/uL Final  . RBC 01/06/2014 3.78* 4.22 - 5.81 MIL/uL Final  . Hemoglobin 01/06/2014 9.8* 13.0 - 17.0 g/dL Final  . HCT 01/06/2014 32.3* 39.0 - 52.0 % Final  . MCV 01/06/2014 85.4  78.0 - 100.0 fL Final  . MCH 01/06/2014 25.9* 26.0 - 34.0 pg Final  . MCHC 01/06/2014 30.3  30.0 - 36.0 g/dL Final  . RDW 01/06/2014 14.2  11.5 - 15.5 % Final  . Platelets 01/06/2014 231  150 - 400 K/uL Final  . Neutrophils Relative % 01/06/2014 62  43 - 77 % Final  . Neutro Abs 01/06/2014 3.8  1.7 - 7.7 K/uL Final  . Lymphocytes Relative 01/06/2014 23  12 - 46 % Final  . Lymphs Abs 01/06/2014 1.4  0.7 - 4.0 K/uL Final  . Monocytes Relative 01/06/2014 12  3 - 12 % Final  . Monocytes Absolute 01/06/2014 0.8  0.1 - 1.0 K/uL Final  . Eosinophils Relative 01/06/2014 3  0 - 5 %  Final  . Eosinophils Absolute 01/06/2014 0.2  0.0 - 0.7 K/uL Final  . Basophils Relative 01/06/2014 0  0 - 1 % Final  . Basophils Absolute 01/06/2014 0.0  0.0 - 0.1 K/uL Final  . Glucose-Capillary 01/05/2014 102* 70 - 99 mg/dL Final  . Glucose-Capillary 01/05/2014 96  70 - 99 mg/dL Final  . Glucose-Capillary 01/05/2014 92  70 - 99 mg/dL Final  . Glucose-Capillary 01/05/2014 156* 70 - 99 mg/dL Final  . Glucose-Capillary 01/06/2014 96  70 - 99 mg/dL Final  . Glucose-Capillary 01/06/2014 91  70 - 99 mg/dL Final  Office Visit on 12/14/2013  Component Date Value Ref Range Status  . Glucose-Capillary 12/30/2013 131* 70 - 99 mg/dL Final  .  Glucose-Capillary 12/30/2013 136* 70 - 99 mg/dL Final  . Glucose-Capillary 12/31/2013 286* 70 - 99 mg/dL Final  . Glucose-Capillary 12/31/2013 277* 70 - 99 mg/dL Final  . Glucose-Capillary 01/03/2014 187* 70 - 99 mg/dL Final  . Glucose-Capillary 01/03/2014 198* 70 - 99 mg/dL Final  . Glucose-Capillary 01/04/2014 138* 70 - 99 mg/dL Final  . Comment 1 01/04/2014 Notify RN   Final  . Comment 2 01/04/2014 Documented in Chart   Final  . Glucose-Capillary 01/11/2014 146* 70 - 99 mg/dL Final  . Glucose-Capillary 01/11/2014 167* 70 - 99 mg/dL Final  . Glucose-Capillary 01/12/2014 145* 70 - 99 mg/dL Final  . Glucose-Capillary 01/12/2014 137* 70 - 99 mg/dL Final  . Glucose-Capillary 01/13/2014 119* 70 - 99 mg/dL Final  . Glucose-Capillary 01/13/2014 161* 70 - 99 mg/dL Final     Assessment/Plan  1. Status post below knee amputation of right lower extremity Admitted to SNF at this time for rehabilitation and wound care. Goals are strengthening, healing of the right BKA wound, completion of antibiotic therapy, and safe mobility.  2. Osteomyelitis Continue vancomycin and ceftazidime until 03/11/14  3. Chronic kidney disease (CKD), stage V Continue dialysis  4. Type 1 diabetes mellitus with diabetic foot infection Routine monitoring  5. Diabetes mellitus with  peripheral vascular disease Routine monitoring  6. Essential hypertension Controlled  7. Hypothyroidism, unspecified hypothyroidism type Compensated  8. End-stage renal disease on hemodialysis Continue dialysis  9. Decubitus ulcer of sacral region, stage 2 Continue with wound care nurse. Currently with silver MetroGel, collagen, and bordered foam dressing changes daily  10. Glaucoma Continue current drops  11. Gout of left foot, unspecified cause, unspecified chronicity Continue colchicine

## 2014-03-07 NOTE — ED Notes (Signed)
Called report, awaiting transport to [facility] via [facility].

## 2014-03-07 NOTE — ED Provider Notes (Signed)
CSN: [id number]     Arrival date & time 03/07/14  1836 History   First MD Initiated Contact with Patient 03/07/14 1838     Chief Complaint  Patient presents with  . Leg Pain  . Leg Swelling     (Consider location/radiation/quality/duration/timing/severity/associated sxs/prior Treatment) HPI ORMOND LAZO is a 69 y.o. male with recent right BKA on 02/24/14 for osteomyelitis. States has had pain in right leg since but today after PT pain worsened. Nursing staff noted that pt's leg is red and he was sent here for further evaluation.  Pt denies fever or chills. Leg is not warm to the touch. No drainage. States pain is just worse than before. No injuries. States in PT, he had to stand on the knee and bend his leg, no injuries. No other complaints. Pt requesting pain medication.    Past Medical History  Diagnosis Date  . Hypertension   . Pneumonia     2012  . Heart murmur   . Glaucoma   . Multiple myeloma, without mention of having achieved remission 03/30/2012    Cytogenetic neg on 03/23/2012.  . Asthma   . Hyperparathyroidism, secondary renal   . Peripheral arterial disease   . End-stage renal disease on hemodialysis     Started HD March 2014.  Cause of ESRD was DM.  Gets HD at [facility] on [address] on MWF schedule.   . Thyroid disease     hyperparathyroidism  . MRSA bacteremia   . Anemia   . Peripheral vascular disease, unspecified 11/19/2012    In the past had R foot toe amps then R TMA. In 2015 had left foot toe amp > then TMA >then L BKA on 05/14/13   . History of MRSA infection 04/22/2013    Bacteremia assoc w L foot wound infection Mar 2015   . Gangrene of foot   . ESRD on hemodialysis   . Diabetes mellitus with peripheral vascular disease   . Hypothyroidism   . Decubitus ulcer of sacral region, stage 2 03/07/2014  . Glaucoma 03/07/2014   Past Surgical History  Procedure Laterality Date  . Thyroidectomy    . Cervical disc surgery    . Insertion of dialysis catheter  Right 03/19/2012    Procedure: INSERTION OF DIALYSIS CATHETER;  Surgeon: Mal Misty, MD;  Location: [place];  Service: Vascular;  Laterality: Right;  Right Internal Jugular  . Av fistula placement Left 03/25/2012    Procedure: ARTERIOVENOUS (AV) FISTULA CREATION;  Surgeon: Mal Misty, MD;  Location: [place];  Service: Vascular;  Laterality: Left;  . Ligation of competing branches of arteriovenous fistula Left 05/08/2012    Procedure: LIGATION OF COMPETING BRANCHES OF ARTERIOVENOUS FISTULA;  Surgeon: Mal Misty, MD;  Location: [place];  Service: Vascular;  Laterality: Left;  Ultrasound guided  . Cardiac catheterization      approx 30 years ago  . Amputation Right 11/10/2012    Procedure: AMPUTATION FIRST and SECOND TOES Right Foot;  Surgeon: Elam Dutch, MD;  Location[facility];  Service: Vascular;  Laterality: Right;  . Transmetatarsal amputation Left 12/16/2012    Procedure: TRANSMETATARSAL AMPUTATION AND VAC PLACEMENT;  Surgeon: Elam Dutch, MD;  Location: [place];  Service: Vascular;  Laterality: Left;  . Amputation Left 04/07/2013    Procedure: AMPUTATION DIGIT- LEFT 1ST TOE;  Surgeon: Mal Misty, MD;  Location: [place];  Service: Vascular;  Laterality: Left;  . Tee without cardioversion N/A 04/20/2013  Procedure: TRANSESOPHAGEAL ECHOCARDIOGRAM (TEE);  Surgeon: Josue Hector, MD;  Location: [facility] ENDOSCOPY;  Service: Cardiovascular;  Laterality: N/A;  . I&d extremity Left 04/22/2013    Procedure: MEQASTMHDQ QIW DEBRIDEMENT LEFT FIRST TOE AMPUTATION WOUND ;  Surgeon: Mal Misty, MD;  Location: [place];  Service: Vascular;  Laterality: Left;  . Amputation Left 04/26/2013    Procedure: Left Foot Transmetatarsal Amputation;  Surgeon: Newt Minion, MD;  Location: [place];  Service: Orthopedics;  Laterality: Left;  . Toe amputation      D/C 04-30-13  . Below knee leg amputation Left 05/14/2013    DR [name]  . Amputation Left 05/14/2013    Procedure: AMPUTATION BELOW KNEE;  Surgeon: Newt Minion,  MD;  Location: [place];  Service: Orthopedics;  Laterality: Left;  Left Below Knee Amputation  . Eye surgery Bilateral     CATARACTS  . Amputation Left 07/23/2013    Procedure: AMPUTATION BELOW KNEE;  Surgeon: Newt Minion, MD;  Location: [place];  Service: Orthopedics;  Laterality: Left;  Left Below Knee Amputation Revision  . Abdominal aortagram Bilateral 11/06/2012    Procedure: ABDOMINAL AORTAGRAM;  Surgeon: Elam Dutch, MD;  Location: [facility] CATH LAB;  Service: Cardiovascular;  Laterality: Bilateral;  . I&d extremity Right 01/05/2014    Procedure: IRRIGATION AND DEBRIDEMENT Right Heel Ulcer;  Surgeon: Mcarthur Rossetti, MD;  Location: [place];  Service: Orthopedics;  Laterality: Right;  Surgeon Available after 5PM  . Below knee leg amputation Right 02/18/2014    dr duda  . Amputation Right 02/18/2014    Procedure: AMPUTATION BELOW KNEE;  Surgeon: Newt Minion, MD;  Location: [place];  Service: Orthopedics;  Laterality: Right;   Family History  Problem Relation Age of Onset  . Diabetes Mother   . Cancer Mother     bone   . Kidney disease Father    History  Substance Use Topics  . Smoking status: Former Smoker    Types: Cigarettes    Quit date: 03/19/1982  . Smokeless tobacco: Never Used  . Alcohol Use: No     Comment: formerly    Review of Systems  Constitutional: Negative for fever and chills.  Respiratory: Negative for cough, chest tightness and shortness of breath.   Cardiovascular: Negative for chest pain, palpitations and leg swelling.  Gastrointestinal: Negative for nausea, vomiting, abdominal pain, diarrhea and abdominal distention.  Musculoskeletal: Positive for myalgias and arthralgias. Negative for neck pain and neck stiffness.  Skin: Positive for color change. Negative for rash.  Allergic/Immunologic: Negative for immunocompromised state.  Neurological: Negative for dizziness, weakness, light-headedness, numbness and headaches.      Allergies  Bee venom;  Lisinopril; Ivp dye; and Morphine and related  Home Medications   Prior to Admission medications   Medication Sig Start Date End Date Taking? Authorizing Provider  aspirin EC 81 MG tablet Take 81 mg by mouth daily.    Historical Provider, MD  b complex-vitamin c-folic acid (NEPHRO-VITE) 0.8 MG TABS tablet Take 1 tablet by mouth daily.    Historical Provider, MD  calcium acetate (PHOSLO) 667 MG capsule Take 1,334 mg by mouth 3 (three) times daily with meals.    Historical Provider, MD  cefTAZidime 2 g in dextrose 5 % 50 mL Inject 2 g into the vein every Saturday with hemodialysis. One dose per holiday schedule 01/08/14   Ripudeep K Rai, MD  cefTAZidime 2 g in dextrose 5 % 50 mL Inject 2 g into the vein  every Monday, Wednesday, and Friday with hemodialysis. X 2 weeks 01/10/14   Ripudeep Krystal Eaton, MD  colchicine 0.6 MG tablet Take 1 tablet (0.6 mg total) by mouth 2 (two) times a week. 01/10/14   Ripudeep Krystal Eaton, MD  feeding supplement, RESOURCE BREEZE, (RESOURCE BREEZE) LIQD Take 1 Container by mouth 3 (three) times daily between meals.    Historical Provider, MD  gabapentin (NEURONTIN) 100 MG capsule Take 2 capsules (200 mg total) by mouth at bedtime. 12/14/13   Meredith Staggers, MD  lanthanum (FOSRENOL) 1000 MG chewable tablet Chew 1,000 mg by mouth 3 (three) times daily with meals.    Historical Provider, MD  levothyroxine (SYNTHROID, LEVOTHROID) 175 MCG tablet Take 175 mcg by mouth daily before breakfast.    Historical Provider, MD  megestrol (MEGACE) 40 MG tablet Take 40 mg by mouth daily. 01/24/14   Historical Provider, MD  multivitamin (RENA-VIT) TABS tablet Take 1 tablet by mouth at bedtime.    Historical Provider, MD  Oxycodone HCl 10 MG TABS Take 0.5-1 tablets (5-10 mg total) by mouth every 6 (six) hours as needed (for severe pain). 01/06/14   Ripudeep Krystal Eaton, MD  oxyCODONE-acetaminophen (ROXICET) 5-325 MG per tablet Take 1 tablet by mouth every 4 (four) hours as needed for severe pain. 02/25/14    Newt Minion, MD  promethazine (PHENERGAN) 12.5 MG tablet Take 1 tablet (12.5 mg total) by mouth every 6 (six) hours as needed for nausea or vomiting. 01/06/14   Ripudeep Krystal Eaton, MD  Vancomycin (VANCOCIN) 750 MG/150ML SOLN Inject 150 mLs (750 mg total) into the vein every Monday, Wednesday, and Friday with hemodialysis. X 2 weeks 01/06/14   Ripudeep K Rai, MD   BP 112/66 mmHg  Pulse 67  Temp(Src) 98 F (36.7 C) (Oral)  Resp 17  SpO2 100% Physical Exam  Constitutional: He appears well-developed and well-nourished. No distress.  HENT:  Head: Normocephalic and atraumatic.  Eyes: Conjunctivae are normal.  Neck: Neck supple.  Cardiovascular: Normal rate, regular rhythm and normal heart sounds.   Pulmonary/Chest: Effort normal. No respiratory distress. He has no wheezes. He has no rales.  Abdominal: Soft. Bowel sounds are normal. He exhibits no distension. There is no tenderness. There is no rebound.  Musculoskeletal: He exhibits no edema.  Bilateral BKA. Left BKA normal, healed. Right BKA with incision intact, healing with some scabbing. No drainage. The flap of the stump is erythematous, non blanching. TTP. No warmth to the touch.   Neurological: He is alert.  Skin: Skin is warm and dry.  Nursing note and vitals reviewed.   ED Course  Procedures (including critical care time) Labs Review Labs Reviewed  CBC WITH DIFFERENTIAL/PLATELET - Abnormal; Notable for the following:    WBC 11.6 (*)    RDW 15.6 (*)    Platelets 425 (*)    Neutro Abs 8.2 (*)    All other components within normal limits  I-STAT CHEM 8, ED - Abnormal; Notable for the following:    Creatinine, Ser 4.20 (*)    Glucose, Bld 122 (*)    All other components within normal limits    Imaging Review Dg Tibia/fibula Right  03/07/2014   CLINICAL DATA:  Throbbing and pain in the right leg after therapy today. No known injury.  EXAM: RIGHT TIBIA AND FIBULA - 2 VIEW  COMPARISON:  None.  FINDINGS: Postoperative right below  the knee amputation. Margin of resection in the tibia and fibula appears intact. No focal bone erosion  or sclerosis. Soft tissues appear intact. No soft tissue gas collections. Vascular calcifications. Degenerative changes in the right knee with mild medial compartment narrowing. No significant effusion. No acute fracture or dislocation.  IMPRESSION: Right below the knee amputation. Mild degenerative changes in the right knee. No acute bony abnormalities suggested.   Electronically Signed   By: Lucienne Capers M.D.   On: 03/07/2014 20:30     EKG Interpretation None      MDM   Final diagnoses:  Leg pain  Pain of amputation stump of right lower extremity    Patient with recent right BKA, here with increased pain and redness to the stump. Pain worsened this afternoon after physical therapy, patient and his family are not sure when the redness of the stump started, they stated that it's always covered. On exam, there is erythema to the bottom of the stump, it is nonblanching, it is not warm to the touch, there is no bruising, I do not think this is cellulitis. He denies any trauma. Given increased pain after physical therapy will get x-ray and labs.  11:11 PM Patient's labs show slightly elevated white count of 11.6. There is no left shift. X-rays unremarkable. I discussed with Dr. Vallery Ridge who has seen patient as well, most likely inflammatory changes versus bruising since the surgery. The rash is more vascular in appearance, no evidence of cellulitis, there is no drainage from the wound. Patient will be discharged home with close outpatient follow-up. He received 2 Percocets for pain in emergency department. He is taking Percocet at home for the pain, instructed to continue. Instructed to keep his leg elevated. Return precautions discussed.  Filed Vitals:   03/07/14 2100 03/07/14 2115 03/07/14 2145 03/07/14 2230  BP: 97/60 107/62 115/61 110/60  Pulse: 66 69 66 60  Temp:    97.9 F (36.6 C)   TempSrc:      Resp: _0 SpO2: 96% 100% 98% 96%     Renold Genta, PA-C 03/07/14 2313  Charlesetta Shanks, MD 03/08/14 2006

## 2014-03-07 NOTE — ED Notes (Signed)
Discussed with Jack [name] labs have resulted. She acknowledges, no new orders at this time.

## 2014-03-07 NOTE — ED Notes (Signed)
Pt presents via [facility] from [facility].  Pt had right BKA on the 11th and per report swelling and redness noted to right leg, facility wanted him assessed for possible DVT.  Pt is dialysis MWF and completed dialysis today.  Pt also had L BKA approximately 8 months ago.  Redness noted to right stump, no swelling to right leg.  BP-142/92 P-82 R-18. Pt a x 4, NAD.

## 2014-03-16 ENCOUNTER — Encounter: Payer: Self-pay | Admitting: Adult Health

## 2014-03-16 ENCOUNTER — Non-Acute Institutional Stay (SKILLED_NURSING_FACILITY): Payer: Medicare Other | Admitting: Adult Health

## 2014-03-16 DIAGNOSIS — N186 End stage renal disease: Secondary | ICD-10-CM | POA: Diagnosis not present

## 2014-03-16 DIAGNOSIS — Z992 Dependence on renal dialysis: Secondary | ICD-10-CM

## 2014-03-16 DIAGNOSIS — M869 Osteomyelitis, unspecified: Secondary | ICD-10-CM

## 2014-03-16 DIAGNOSIS — E1151 Type 2 diabetes mellitus with diabetic peripheral angiopathy without gangrene: Secondary | ICD-10-CM

## 2014-03-16 DIAGNOSIS — I10 Essential (primary) hypertension: Secondary | ICD-10-CM

## 2014-03-16 DIAGNOSIS — E039 Hypothyroidism, unspecified: Secondary | ICD-10-CM

## 2014-03-16 DIAGNOSIS — Z89511 Acquired absence of right leg below knee: Secondary | ICD-10-CM | POA: Diagnosis not present

## 2014-03-16 NOTE — Progress Notes (Signed)
Patient ID: Jack [name], male   DOB: 02-03-1945, 69 y.o.   MRN: [id number]   03/16/2014  Facility:  Nursing Home Location:  [place] Room Number: 203-5 LEVEL OF CARE:  SNF (31)   Chief Complaint  Patient presents with  . Discharge Note    Osteomyelitis S/P  Right BKA, hypothyroidism, neuropathy, diabetes mellitus, ESRD and hypertension     HISTORY OF PRESENT ILLNESS:  This is a 69 year old male who is for discharge home with home health PT, OT, nursing and home health aide. He has PMH of hypertension, glaucoma, hyperparathyroidism and hypothyroidism. He has been admitted to [facility] on 02/25/14 from [facility] with Osteomyelitis S/P Right BKA. He has finished his IV vancomycin and Ceftazidime, which were given on dialysis days.  Patient was admitted to this facility for short-term rehabilitation after the patient's recent hospitalization.  Patient has completed SNF rehabilitation and therapy has cleared the patient for discharge.  PAST MEDICAL HISTORY:  Past Medical History  Diagnosis Date  . Hypertension   . Pneumonia     2012  . Heart murmur   . Glaucoma   . Multiple myeloma, without mention of having achieved remission 03/30/2012    Cytogenetic neg on 03/23/2012.  . Asthma   . Hyperparathyroidism, secondary renal   . Peripheral arterial disease   . End-stage renal disease on hemodialysis     Started HD March 2014.  Cause of ESRD was DM.  Gets HD at [facility] on [address] on MWF schedule.   . Thyroid disease     hyperparathyroidism  . MRSA bacteremia   . Anemia   . Peripheral vascular disease, unspecified 11/19/2012    In the past had R foot toe amps then R TMA. In 2015 had left foot toe amp > then TMA >then L BKA on 05/14/13   . History of MRSA infection 04/22/2013    Bacteremia assoc w L foot wound infection Mar 2015   . Gangrene of foot   . ESRD on hemodialysis   . Diabetes mellitus with peripheral vascular disease   .  Hypothyroidism   . Decubitus ulcer of sacral region, stage 2 03/07/2014  . Glaucoma 03/07/2014    CURRENT MEDICATIONS: Reviewed per MAR/see medication list  Allergies  Allergen Reactions  . Bee Venom Anaphylaxis  . Lisinopril Cough  . Ivp Dye [Iodinated Diagnostic Agents] Hives  . Morphine And Related Hives and Other (See Comments)    Bradycardia states patient     REVIEW OF SYSTEMS:  GENERAL: no change in appetite, no fatigue, no weight changes, no fever, chills or weakness RESPIRATORY: no cough, SOB, DOE, wheezing, hemoptysis CARDIAC: no chest pain, edema or palpitations GI: no abdominal pain, diarrhea, constipation, heart burn, nausea or vomiting  PHYSICAL EXAMINATION  GENERAL: no acute distress, normal body habitus EYES: conjunctivae normal, sclerae normal, normal eye lids NECK: supple, trachea midline, no neck masses, no thyroid tenderness, no thyromegaly LYMPHATICS: no LAN in the neck, no supraclavicular LAN RESPIRATORY: breathing is even & unlabored, BS CTAB CARDIAC: RRR, no murmur,no extra heart sounds, no edema GI: abdomen soft, normal BS, no masses, no tenderness, no hepatomegaly, no splenomegaly EXTREMITIES: Able to move 4 extremities, Right BKA covered with dry dressing, old Left BKA, Left upper arm AV shunt + for bruit and thrill PSYCHIATRIC: the patient is alert & oriented to person, affect & behavior appropriate  LABS/RADIOLOGY: Labs reviewed: Basic Metabolic Panel:  Recent Labs  05/04/13 0847  02/21/14 2774 02/23/14 0920 02/25/14 0851 03/07/14 2018  NA 142  < > 136 139 135 140  K 3.9  < > 4.7 4.5 3.7 4.3  CL 92*  < > 98 101 96 97  CO2 23  < > '25 27 27  ' --   GLUCOSE 149*  < > 120* 120* 187* 122*  BUN 53*  < > 43* 40* 30* 18  CREATININE 10.40*  < > 8.37* 7.26* 6.42* 4.20*  CALCIUM 9.8  < > 8.6 9.1 9.3  --   MG 2.3  --   --   --   --   --   PHOS 7.0*  < > 4.1 3.6 2.9  --   < > = values in this interval not displayed. Liver Function  Tests:  Recent Labs  01/05/14 0500 01/06/14 0441 02/17/14 1551 02/21/14 0852 02/23/14 0920 02/25/14 0851  AST '20 11 15  ' --   --   --   ALT '12 9 16  ' --   --   --   ALKPHOS 71 63 86  --   --   --   BILITOT 0.9 0.6 0.5  --   --   --   PROT 6.1 6.1 7.1  --   --   --   ALBUMIN 2.7* 2.6* 3.0* 2.3* 2.2* 2.2*    Recent Labs  05/01/13 1921  LIPASE 7*   CBC:  Recent Labs  01/05/14 0500 01/06/14 0441  02/23/14 0919 02/25/14 0851 03/07/14 1912 03/07/14 2018  WBC 6.6 6.2  < > 8.5 11.8* 11.6*  --   NEUTROABS 4.2 3.8  --   --   --  8.2*  --   HGB 10.6* 9.8*  < > 8.0* 7.8* 13.9 16.3  HCT 34.3* 32.3*  < > 26.3* 25.9* 44.0 48.0  MCV 84.7 85.4  < > 87.4 86.9 88.7  --   PLT 253 231  < > 252 270 425*  --   < > = values in this interval not displayed.  CBG:  Recent Labs  02/21/14 1633 02/23/14 2030 02/24/14 2103  GLUCAP 133* 168* 153*    Dg Tibia/fibula Right  03/07/2014   CLINICAL DATA:  Throbbing and pain in the right leg after therapy today. No known injury.  EXAM: RIGHT TIBIA AND FIBULA - 2 VIEW  COMPARISON:  None.  FINDINGS: Postoperative right below the knee amputation. Margin of resection in the tibia and fibula appears intact. No focal bone erosion or sclerosis. Soft tissues appear intact. No soft tissue gas collections. Vascular calcifications. Degenerative changes in the right knee with mild medial compartment narrowing. No significant effusion. No acute fracture or dislocation.  IMPRESSION: Right below the knee amputation. Mild degenerative changes in the right knee. No acute bony abnormalities suggested.   Electronically Signed   By: Lucienne Capers M.D.   On: 03/07/2014 20:30    ASSESSMENT/PLAN:  Osteomyelitis S/P Right BKA - for Home health PT, OT, Nursing and Home health aide; continue Percocet 5/325 mg 1 tab by mouth every 4 hours when necessary and oxycodone 5 mg 1-2 tabs by mouth every 6 hours when necessary for pain; finished antibiotic course; follow-up with  Dr. Sharol Given, ortho surgeon Hypothyroidism - continue Synthroid 175 g 1 tab by mouth daily Neuropathy - continue Neurontin 100 mg 2 capsules = 200 mg by mouth every 9 PM Diabetes mellitus, type 2 - hemoglobin A1c 6.9; diet-controlled Hypertension - well controlled   I have filled out patient's discharge  paperwork and written prescriptions.  Patient will receive home health PT, OT, Nursing and CNA.  Total discharge time: Greater than 30 minutes  Discharge time involved coordination of the discharge process with social worker, nursing staff and therapy department. Medical justification for home health services verified.    [facility], NP [employer] [phone]

## 2014-04-04 ENCOUNTER — Telehealth: Payer: Self-pay | Admitting: *Deleted

## 2014-04-04 NOTE — Telephone Encounter (Signed)
Calling for refill on oxycodone and oxycontin.  Has called twice.  I looked up and has not been seen since December 2015 and has been in hospital.  Told must call Dr Jess Barters office.

## 2014-04-06 ENCOUNTER — Encounter (HOSPITAL_COMMUNITY): Payer: Self-pay | Admitting: Emergency Medicine

## 2014-04-06 ENCOUNTER — Emergency Department (HOSPITAL_COMMUNITY): Payer: Medicare Other

## 2014-04-06 ENCOUNTER — Inpatient Hospital Stay (HOSPITAL_COMMUNITY)
Admission: EM | Admit: 2014-04-06 | Discharge: 2014-04-08 | DRG: 871 | Disposition: A | Discharge status: Home / Self Care | Payer: Medicare Other | Attending: Internal Medicine | Admitting: Internal Medicine

## 2014-04-06 ENCOUNTER — Other Ambulatory Visit (HOSPITAL_COMMUNITY): Payer: Self-pay

## 2014-04-06 DIAGNOSIS — I10 Essential (primary) hypertension: Secondary | ICD-10-CM | POA: Diagnosis present

## 2014-04-06 DIAGNOSIS — J45909 Unspecified asthma, uncomplicated: Secondary | ICD-10-CM | POA: Diagnosis present

## 2014-04-06 DIAGNOSIS — L089 Local infection of the skin and subcutaneous tissue, unspecified: Secondary | ICD-10-CM | POA: Diagnosis present

## 2014-04-06 DIAGNOSIS — C9000 Multiple myeloma not having achieved remission: Secondary | ICD-10-CM | POA: Diagnosis present

## 2014-04-06 DIAGNOSIS — I248 Other forms of acute ischemic heart disease: Secondary | ICD-10-CM | POA: Diagnosis present

## 2014-04-06 DIAGNOSIS — M109 Gout, unspecified: Secondary | ICD-10-CM | POA: Diagnosis present

## 2014-04-06 DIAGNOSIS — IMO0002 Reserved for concepts with insufficient information to code with codable children: Secondary | ICD-10-CM

## 2014-04-06 DIAGNOSIS — I4891 Unspecified atrial fibrillation: Secondary | ICD-10-CM | POA: Diagnosis present

## 2014-04-06 DIAGNOSIS — N186 End stage renal disease: Secondary | ICD-10-CM

## 2014-04-06 DIAGNOSIS — I120 Hypertensive chronic kidney disease with stage 5 chronic kidney disease or end stage renal disease: Secondary | ICD-10-CM | POA: Diagnosis present

## 2014-04-06 DIAGNOSIS — Z992 Dependence on renal dialysis: Secondary | ICD-10-CM

## 2014-04-06 DIAGNOSIS — A419 Sepsis, unspecified organism: Secondary | ICD-10-CM | POA: Diagnosis present

## 2014-04-06 DIAGNOSIS — Z886 Allergy status to analgesic agent status: Secondary | ICD-10-CM

## 2014-04-06 DIAGNOSIS — N2581 Secondary hyperparathyroidism of renal origin: Secondary | ICD-10-CM | POA: Diagnosis present

## 2014-04-06 DIAGNOSIS — Z794 Long term (current) use of insulin: Secondary | ICD-10-CM

## 2014-04-06 DIAGNOSIS — R5383 Other fatigue: Secondary | ICD-10-CM

## 2014-04-06 DIAGNOSIS — E1051 Type 1 diabetes mellitus with diabetic peripheral angiopathy without gangrene: Secondary | ICD-10-CM | POA: Diagnosis present

## 2014-04-06 DIAGNOSIS — Z8614 Personal history of Methicillin resistant Staphylococcus aureus infection: Secondary | ICD-10-CM | POA: Diagnosis not present

## 2014-04-06 DIAGNOSIS — Z91041 Radiographic dye allergy status: Secondary | ICD-10-CM | POA: Diagnosis not present

## 2014-04-06 DIAGNOSIS — Z91030 Bee allergy status: Secondary | ICD-10-CM

## 2014-04-06 DIAGNOSIS — I739 Peripheral vascular disease, unspecified: Secondary | ICD-10-CM | POA: Diagnosis present

## 2014-04-06 DIAGNOSIS — Z9842 Cataract extraction status, left eye: Secondary | ICD-10-CM

## 2014-04-06 DIAGNOSIS — Z89511 Acquired absence of right leg below knee: Secondary | ICD-10-CM

## 2014-04-06 DIAGNOSIS — Z89519 Acquired absence of unspecified leg below knee: Secondary | ICD-10-CM | POA: Diagnosis not present

## 2014-04-06 DIAGNOSIS — Z87891 Personal history of nicotine dependence: Secondary | ICD-10-CM

## 2014-04-06 DIAGNOSIS — D472 Monoclonal gammopathy: Secondary | ICD-10-CM | POA: Diagnosis present

## 2014-04-06 DIAGNOSIS — E10628 Type 1 diabetes mellitus with other skin complications: Secondary | ICD-10-CM | POA: Diagnosis present

## 2014-04-06 DIAGNOSIS — E039 Hypothyroidism, unspecified: Secondary | ICD-10-CM | POA: Diagnosis present

## 2014-04-06 DIAGNOSIS — Z89429 Acquired absence of other toe(s), unspecified side: Secondary | ICD-10-CM

## 2014-04-06 DIAGNOSIS — E890 Postprocedural hypothyroidism: Secondary | ICD-10-CM | POA: Diagnosis present

## 2014-04-06 DIAGNOSIS — D649 Anemia, unspecified: Secondary | ICD-10-CM | POA: Diagnosis present

## 2014-04-06 DIAGNOSIS — R531 Weakness: Secondary | ICD-10-CM

## 2014-04-06 DIAGNOSIS — H409 Unspecified glaucoma: Secondary | ICD-10-CM | POA: Diagnosis present

## 2014-04-06 DIAGNOSIS — Z7982 Long term (current) use of aspirin: Secondary | ICD-10-CM

## 2014-04-06 DIAGNOSIS — T148XXA Other injury of unspecified body region, initial encounter: Secondary | ICD-10-CM

## 2014-04-06 DIAGNOSIS — L89152 Pressure ulcer of sacral region, stage 2: Secondary | ICD-10-CM | POA: Diagnosis present

## 2014-04-06 DIAGNOSIS — R7989 Other specified abnormal findings of blood chemistry: Secondary | ICD-10-CM | POA: Diagnosis present

## 2014-04-06 DIAGNOSIS — T798XXA Other early complications of trauma, initial encounter: Secondary | ICD-10-CM

## 2014-04-06 DIAGNOSIS — I444 Left anterior fascicular block: Secondary | ICD-10-CM | POA: Diagnosis present

## 2014-04-06 DIAGNOSIS — Z9841 Cataract extraction status, right eye: Secondary | ICD-10-CM

## 2014-04-06 DIAGNOSIS — E11628 Type 2 diabetes mellitus with other skin complications: Secondary | ICD-10-CM | POA: Diagnosis present

## 2014-04-06 DIAGNOSIS — Z89512 Acquired absence of left leg below knee: Secondary | ICD-10-CM | POA: Diagnosis not present

## 2014-04-06 DIAGNOSIS — I440 Atrioventricular block, first degree: Secondary | ICD-10-CM | POA: Diagnosis present

## 2014-04-06 LAB — CBC WITH DIFFERENTIAL/PLATELET
Basophils Absolute: 0 10*3/uL (ref 0.0–0.1)
Basophils Relative: 0 % (ref 0–1)
Eosinophils Absolute: 0.1 10*3/uL (ref 0.0–0.7)
Eosinophils Relative: 1 % (ref 0–5)
HEMATOCRIT: 37.5 % — AB (ref 39.0–52.0)
HEMOGLOBIN: 11.8 g/dL — AB (ref 13.0–17.0)
LYMPHS ABS: 1.2 10*3/uL (ref 0.7–4.0)
LYMPHS PCT: 11 % — AB (ref 12–46)
MCH: 26.7 pg (ref 26.0–34.0)
MCHC: 31.5 g/dL (ref 30.0–36.0)
MCV: 84.8 fL (ref 78.0–100.0)
MONO ABS: 0.5 10*3/uL (ref 0.1–1.0)
MONOS PCT: 5 % (ref 3–12)
NEUTROS ABS: 8.9 10*3/uL — AB (ref 1.7–7.7)
NEUTROS PCT: 83 % — AB (ref 43–77)
Platelets: 262 10*3/uL (ref 150–400)
RBC: 4.42 MIL/uL (ref 4.22–5.81)
RDW: 15 % (ref 11.5–15.5)
WBC: 10.7 10*3/uL — AB (ref 4.0–10.5)

## 2014-04-06 LAB — BASIC METABOLIC PANEL
Anion gap: 15 (ref 5–15)
BUN: 27 mg/dL — AB (ref 6–23)
CHLORIDE: 89 mmol/L — AB (ref 96–112)
CO2: 28 mmol/L (ref 19–32)
Calcium: 9 mg/dL (ref 8.4–10.5)
Creatinine, Ser: 5.8 mg/dL — ABNORMAL HIGH (ref 0.50–1.35)
GFR calc non Af Amer: 9 mL/min — ABNORMAL LOW (ref >90)
GFR, EST AFRICAN AMERICAN: 10 mL/min — AB (ref >90)
Glucose, Bld: 197 mg/dL — ABNORMAL HIGH (ref 70–99)
POTASSIUM: 4.2 mmol/L (ref 3.5–5.1)
Sodium: 132 mmol/L — ABNORMAL LOW (ref 135–145)

## 2014-04-06 LAB — I-STAT TROPONIN, ED: Troponin i, poc: 0.09 ng/mL (ref 0.00–0.08)

## 2014-04-06 LAB — TROPONIN I: Troponin I: 0.08 ng/mL — ABNORMAL HIGH (ref <0.031)

## 2014-04-06 MED ORDER — VANCOMYCIN HCL 10 G IV SOLR
1250.0000 mg | Freq: Once | INTRAVENOUS | Status: AC
  Administered 2014-04-07: 1250 mg via INTRAVENOUS
  Filled 2014-04-06: qty 1250

## 2014-04-06 MED ORDER — SODIUM CHLORIDE 0.9 % IV SOLN
1000.0000 mL | Freq: Once | INTRAVENOUS | Status: AC
  Administered 2014-04-06: 250 mL via INTRAVENOUS

## 2014-04-06 NOTE — ED Provider Notes (Signed)
CSN: [id number]     Arrival date & time 04/06/14  1927 History   First MD Initiated Contact with Patient 04/06/14 2007     Chief Complaint  Patient presents with  . Atrial Fibrillation  . Fatigue     (Consider location/radiation/quality/duration/timing/severity/associated sxs/prior Treatment) HPI Comments: Patient is a 69 year old male with a past medical history of CKD on dialysis MWF, diabetes, hypertension and bilateral BKA who presents with fatigue that started earlier today after finishing dialysis. Patient's daughters are present and provide the history. They reports after the patient finished dialysis he complained of being very tired, and the family reports he was not mentating per usual. No aggravating/alleviating factors. No other associated symptoms. The family reports during dialysis, he will have 6L of fluids removed. The volume being taken off has not been adjusted since his BKA. He is often tired after dialysis, but today he was more tired than usual.    Past Medical History  Diagnosis Date  . Hypertension   . Pneumonia     2012  . Heart murmur   . Glaucoma   . Multiple myeloma, without mention of having achieved remission 03/30/2012    Cytogenetic neg on 03/23/2012.  . Asthma   . Hyperparathyroidism, secondary renal   . Peripheral arterial disease   . End-stage renal disease on hemodialysis     Started HD March 2014.  Cause of ESRD was DM.  Gets HD at [facility] on [address] on MWF schedule.   . Thyroid disease     hyperparathyroidism  . MRSA bacteremia   . Anemia   . Peripheral vascular disease, unspecified 11/19/2012    In the past had R foot toe amps then R TMA. In 2015 had left foot toe amp > then TMA >then L BKA on 05/14/13   . History of MRSA infection 04/22/2013    Bacteremia assoc w L foot wound infection Mar 2015   . Gangrene of foot   . ESRD on hemodialysis   . Diabetes mellitus with peripheral vascular disease   . Hypothyroidism   . Decubitus ulcer of  sacral region, stage 2 03/07/2014  . Glaucoma 03/07/2014   Past Surgical History  Procedure Laterality Date  . Thyroidectomy    . Cervical disc surgery    . Insertion of dialysis catheter Right 03/19/2012    Procedure: INSERTION OF DIALYSIS CATHETER;  Surgeon: Mal Misty, MD;  Location: [place];  Service: Vascular;  Laterality: Right;  Right Internal Jugular  . Av fistula placement Left 03/25/2012    Procedure: ARTERIOVENOUS (AV) FISTULA CREATION;  Surgeon: Mal Misty, MD;  Location: [place];  Service: Vascular;  Laterality: Left;  . Ligation of competing branches of arteriovenous fistula Left 05/08/2012    Procedure: LIGATION OF COMPETING BRANCHES OF ARTERIOVENOUS FISTULA;  Surgeon: Mal Misty, MD;  Location: [place];  Service: Vascular;  Laterality: Left;  Ultrasound guided  . Cardiac catheterization      approx 30 years ago  . Amputation Right 11/10/2012    Procedure: AMPUTATION FIRST and SECOND TOES Right Foot;  Surgeon: Elam Dutch, MD;  Location: [facility] OR;  Service: Vascular;  Laterality: Right;  . Transmetatarsal amputation Left 12/16/2012    Procedure: TRANSMETATARSAL AMPUTATION AND VAC PLACEMENT;  Surgeon: Elam Dutch, MD;  Location: [place];  Service: Vascular;  Laterality: Left;  . Amputation Left 04/07/2013    Procedure: AMPUTATION DIGIT- LEFT 1ST TOE;  Surgeon: Mal Misty, MD;  Location: [facility]  OR;  Service: Vascular;  Laterality: Left;  . Tee without cardioversion N/A 04/20/2013    Procedure: TRANSESOPHAGEAL ECHOCARDIOGRAM (TEE);  Surgeon: Josue Hector, MD;  Location: [facility] ENDOSCOPY;  Service: Cardiovascular;  Laterality: N/A;  . I&d extremity Left 04/22/2013    Procedure: [id number] XTK DEBRIDEMENT LEFT FIRST TOE AMPUTATION WOUND ;  Surgeon: Mal Misty, MD;  Location: [place];  Service: Vascular;  Laterality: Left;  . Amputation Left 04/26/2013    Procedure: Left Foot Transmetatarsal Amputation;  Surgeon: Newt Minion, MD;  Location: [place];  Service: Orthopedics;   Laterality: Left;  . Toe amputation      D/C 04-30-13  . Below knee leg amputation Left 05/14/2013    DR [name]  . Amputation Left 05/14/2013    Procedure: AMPUTATION BELOW KNEE;  Surgeon: Newt Minion, MD;  Location: [place];  Service: Orthopedics;  Laterality: Left;  Left Below Knee Amputation  . Eye surgery Bilateral     CATARACTS  . Amputation Left 07/23/2013    Procedure: AMPUTATION BELOW KNEE;  Surgeon: Newt Minion, MD;  Location: [place];  Service: Orthopedics;  Laterality: Left;  Left Below Knee Amputation Revision  . Abdominal aortagram Bilateral 11/06/2012    Procedure: ABDOMINAL AORTAGRAM;  Surgeon: Elam Dutch, MD;  Location: [facility] CATH LAB;  Service: Cardiovascular;  Laterality: Bilateral;  . I&d extremity Right 01/05/2014    Procedure: IRRIGATION AND DEBRIDEMENT Right Heel Ulcer;  Surgeon: Mcarthur Rossetti, MD;  Location: [place];  Service: Orthopedics;  Laterality: Right;  Surgeon Available after 5PM  . Below knee leg amputation Right 02/18/2014    dr duda  . Amputation Right 02/18/2014    Procedure: AMPUTATION BELOW KNEE;  Surgeon: Newt Minion, MD;  Location: [place];  Service: Orthopedics;  Laterality: Right;   Family History  Problem Relation Age of Onset  . Diabetes Mother   . Cancer Mother     bone   . Kidney disease Father    History  Substance Use Topics  . Smoking status: Former Smoker    Types: Cigarettes    Quit date: 03/19/1982  . Smokeless tobacco: Never Used  . Alcohol Use: No     Comment: formerly    Review of Systems  Constitutional: Positive for fatigue. Negative for fever and chills.  HENT: Negative for trouble swallowing.   Eyes: Negative for visual disturbance.  Respiratory: Negative for shortness of breath.   Cardiovascular: Negative for chest pain and palpitations.  Gastrointestinal: Negative for nausea, vomiting, abdominal pain and diarrhea.  Genitourinary: Negative for dysuria and difficulty urinating.  Musculoskeletal: Negative for  arthralgias and neck pain.  Skin: Negative for color change.  Neurological: Negative for dizziness and weakness.  Psychiatric/Behavioral: Negative for dysphoric mood.      Allergies  Bee venom; Lisinopril; Ivp dye; and Morphine and related  Home Medications   Prior to Admission medications   Medication Sig Start Date End Date Taking? Authorizing Provider  aspirin EC 81 MG tablet Take 81 mg by mouth daily.    Historical Provider, MD  b complex-vitamin c-folic acid (NEPHRO-VITE) 0.8 MG TABS tablet Take 1 tablet by mouth daily.    Historical Provider, MD  calcium acetate (PHOSLO) 667 MG capsule Take 1,334 mg by mouth 3 (three) times daily with meals.    Historical Provider, MD  cefTAZidime 2 g in dextrose 5 % 50 mL Inject 2 g into the vein every Saturday with hemodialysis. One dose per holiday schedule 01/08/14  Ripudeep Krystal Eaton, MD  cefTAZidime 2 g in dextrose 5 % 50 mL Inject 2 g into the vein every Monday, Wednesday, and Friday with hemodialysis. X 2 weeks 01/10/14   Ripudeep Krystal Eaton, MD  colchicine 0.6 MG tablet Take 1 tablet (0.6 mg total) by mouth 2 (two) times a week. 01/10/14   Ripudeep Krystal Eaton, MD  feeding supplement, RESOURCE BREEZE, (RESOURCE BREEZE) LIQD Take 1 Container by mouth 3 (three) times daily between meals.    Historical Provider, MD  gabapentin (NEURONTIN) 100 MG capsule Take 2 capsules (200 mg total) by mouth at bedtime. 12/14/13   Meredith Staggers, MD  lanthanum (FOSRENOL) 1000 MG chewable tablet Chew 1,000 mg by mouth 3 (three) times daily with meals.    Historical Provider, MD  levothyroxine (SYNTHROID, LEVOTHROID) 175 MCG tablet Take 175 mcg by mouth daily before breakfast.    Historical Provider, MD  megestrol (MEGACE) 40 MG tablet Take 40 mg by mouth daily. 01/24/14   Historical Provider, MD  multivitamin (RENA-VIT) TABS tablet Take 1 tablet by mouth at bedtime.    Historical Provider, MD  Oxycodone HCl 10 MG TABS Take 0.5-1 tablets (5-10 mg total) by mouth every 6 (six)  hours as needed (for severe pain). 01/06/14   Ripudeep Krystal Eaton, MD  oxyCODONE-acetaminophen (ROXICET) 5-325 MG per tablet Take 1 tablet by mouth every 4 (four) hours as needed for severe pain. 02/25/14   Newt Minion, MD  promethazine (PHENERGAN) 12.5 MG tablet Take 1 tablet (12.5 mg total) by mouth every 6 (six) hours as needed for nausea or vomiting. 01/06/14   Ripudeep Krystal Eaton, MD  Vancomycin (VANCOCIN) 750 MG/150ML SOLN Inject 150 mLs (750 mg total) into the vein every Monday, Wednesday, and Friday with hemodialysis. X 2 weeks 01/06/14   Ripudeep K Rai, MD   BP 110/67 mmHg  Pulse 89  Temp(Src) 99.2 F (37.3 C) (Oral)  Resp 16  SpO2 97% Physical Exam  Constitutional: He is oriented to person, place, and time. He appears well-developed and well-nourished. No distress.  HENT:  Head: Normocephalic and atraumatic.  Eyes: Conjunctivae and EOM are normal.  Neck: Normal range of motion.  Cardiovascular: Normal rate.  Exam reveals no gallop and no friction rub.   No murmur heard. Irregularly irregular rhythm  Pulmonary/Chest: Effort normal and breath sounds normal. He has no wheezes. He has no rales. He exhibits no tenderness.  Abdominal: Soft. He exhibits no distension. There is no tenderness. There is no rebound.  Musculoskeletal: Normal range of motion.  Neurological: He is alert and oriented to person, place, and time. Coordination normal.  Speech is goal-oriented. Moves limbs without ataxia.   Skin: Skin is warm and dry.  Psychiatric: He has a normal mood and affect. His behavior is normal.  Nursing note and vitals reviewed.   ED Course  Procedures (including critical care time) Labs Review Labs Reviewed  CBC WITH DIFFERENTIAL/PLATELET - Abnormal; Notable for the following:    WBC 10.7 (*)    Hemoglobin 11.8 (*)    HCT 37.5 (*)    Neutrophils Relative % 83 (*)    Neutro Abs 8.9 (*)    Lymphocytes Relative 11 (*)    All other components within normal limits  BASIC METABOLIC PANEL  - Abnormal; Notable for the following:    Sodium 132 (*)    Chloride 89 (*)    Glucose, Bld 197 (*)    BUN 27 (*)    Creatinine, Ser 5.80 (*)  GFR calc non Af Amer 9 (*)    GFR calc Af Amer 10 (*)    All other components within normal limits  TROPONIN I - Abnormal; Notable for the following:    Troponin I 0.08 (*)    All other components within normal limits  TROPONIN I - Abnormal; Notable for the following:    Troponin I 0.09 (*)    All other components within normal limits  TROPONIN I - Abnormal; Notable for the following:    Troponin I 0.09 (*)    All other components within normal limits  TROPONIN I - Abnormal; Notable for the following:    Troponin I 0.06 (*)    All other components within normal limits  BRAIN NATRIURETIC PEPTIDE - Abnormal; Notable for the following:    B Natriuretic Peptide 522.2 (*)    All other components within normal limits  LIPID PANEL - Abnormal; Notable for the following:    HDL 23 (*)    All other components within normal limits  COMPREHENSIVE METABOLIC PANEL - Abnormal; Notable for the following:    Chloride 95 (*)    Glucose, Bld 102 (*)    BUN 31 (*)    Creatinine, Ser 6.36 (*)    Albumin 2.7 (*)    GFR calc non Af Amer 8 (*)    GFR calc Af Amer 9 (*)    All other components within normal limits  CBC - Abnormal; Notable for the following:    Hemoglobin 11.3 (*)    HCT 37.0 (*)    All other components within normal limits  GLUCOSE, CAPILLARY - Abnormal; Notable for the following:    Glucose-Capillary 115 (*)    All other components within normal limits  GLUCOSE, CAPILLARY - Abnormal; Notable for the following:    Glucose-Capillary 115 (*)    All other components within normal limits  GLUCOSE, CAPILLARY - Abnormal; Notable for the following:    Glucose-Capillary 141 (*)    All other components within normal limits  GLUCOSE, CAPILLARY - Abnormal; Notable for the following:    Glucose-Capillary 158 (*)    All other components within  normal limits  I-STAT TROPOININ, ED - Abnormal; Notable for the following:    Troponin i, poc 0.09 (*)    All other components within normal limits  MRSA PCR SCREENING  CULTURE, BLOOD (ROUTINE X 2)  CULTURE, BLOOD (ROUTINE X 2)  LACTIC ACID, PLASMA  LACTIC ACID, PLASMA  PROCALCITONIN  PROTIME-INR  APTT  GLUCOSE, CAPILLARY  MAGNESIUM  URINALYSIS, ROUTINE W REFLEX MICROSCOPIC    Imaging Review Dg Chest 2 View  04/06/2014   CLINICAL DATA:  Atrial fibrillation. Dialysis patient with illness since dialysis.  EXAM: CHEST  2 VIEW  COMPARISON:  05/05/2013.  05/01/2013.  FINDINGS: Chronic elevation of the RIGHT hemidiaphragm with RIGHT basilar atelectasis. No airspace disease. No effusion. There are no findings to suggest volume overload. Suboptimal lateral view because the arms are not raised over head. Cardiopericardial silhouette appears similar to prior exams with tortuous atherosclerotic aorta. Monitoring leads project over the chest.  IMPRESSION: No active cardiopulmonary disease or interval change. Chronic elevation of the RIGHT hemidiaphragm.   Electronically Signed   By: Dereck Ligas M.D.   On: 04/06/2014 21:04     EKG Interpretation   Date/Time:  Wednesday April 06 2014 19:39:53 EDT Ventricular Rate:  96 PR Interval:    QRS Duration: 80 QT Interval:  384 QTC Calculation: 485 R Axis:   -61  Text Interpretation:  Left anterior fascicular block Left ventricular  hypertrophy Anterior Q waves, possibly due to LVH Nonspecific T  abnormalities, lateral leads Baseline wander in lead(s) III aVL SR 1  degree AVB Confirmed by Johnney Killian, MD, Jeannie Done [phone]) on 04/06/2014 10:36:57  PM      MDM   Final diagnoses:  Fatigue  Wound infection, initial encounter   8:20 PM Labs, urinalysis, troponin and chest xray pending. Patient has a low grade temp of 99.6 on my exam with remaining vitals stable. Patient in afib now, which is permanent according to family.   Patient admitted for wound  infection    Alvina Chou, PA-C 04/08/14 [id number]  Charlesetta Shanks, MD 04/18/14 [phone]

## 2014-04-06 NOTE — ED Notes (Signed)
Per EMS:  Pt is a MWF dialysis patient.  Pt went to dialysis this morning and "has not been feeling well" ever since.  Pt showing A-fib during transport with HR 80-110.  Pt denies any pain, A&O in room.

## 2014-04-06 NOTE — ED Notes (Signed)
Jack [name] in lab said she would add regular Troponin

## 2014-04-06 NOTE — ED Notes (Signed)
Pt sts he does not create a lot of urine.  Urinal provided and pt encouraged for sample, if able.

## 2014-04-06 NOTE — ED Notes (Signed)
Hospitalist MD Niu at bedside

## 2014-04-07 DIAGNOSIS — A419 Sepsis, unspecified organism: Principal | ICD-10-CM

## 2014-04-07 DIAGNOSIS — N186 End stage renal disease: Secondary | ICD-10-CM

## 2014-04-07 DIAGNOSIS — E039 Hypothyroidism, unspecified: Secondary | ICD-10-CM

## 2014-04-07 DIAGNOSIS — R5383 Other fatigue: Secondary | ICD-10-CM | POA: Insufficient documentation

## 2014-04-07 DIAGNOSIS — L089 Local infection of the skin and subcutaneous tissue, unspecified: Secondary | ICD-10-CM

## 2014-04-07 DIAGNOSIS — Z992 Dependence on renal dialysis: Secondary | ICD-10-CM

## 2014-04-07 DIAGNOSIS — R531 Weakness: Secondary | ICD-10-CM

## 2014-04-07 DIAGNOSIS — L89509 Pressure ulcer of unspecified ankle, unspecified stage: Secondary | ICD-10-CM | POA: Insufficient documentation

## 2014-04-07 DIAGNOSIS — L89522 Pressure ulcer of left ankle, stage 2: Secondary | ICD-10-CM

## 2014-04-07 DIAGNOSIS — E10628 Type 1 diabetes mellitus with other skin complications: Secondary | ICD-10-CM

## 2014-04-07 DIAGNOSIS — L8992 Pressure ulcer of unspecified site, stage 2: Secondary | ICD-10-CM | POA: Insufficient documentation

## 2014-04-07 DIAGNOSIS — D472 Monoclonal gammopathy: Secondary | ICD-10-CM

## 2014-04-07 DIAGNOSIS — I10 Essential (primary) hypertension: Secondary | ICD-10-CM

## 2014-04-07 LAB — COMPREHENSIVE METABOLIC PANEL WITH GFR
ALT: 18 U/L (ref 0–53)
AST: 21 U/L (ref 0–37)
Albumin: 2.7 g/dL — ABNORMAL LOW (ref 3.5–5.2)
Alkaline Phosphatase: 88 U/L (ref 39–117)
Anion gap: 14 (ref 5–15)
BUN: 31 mg/dL — ABNORMAL HIGH (ref 6–23)
CO2: 26 mmol/L (ref 19–32)
Calcium: 8.7 mg/dL (ref 8.4–10.5)
Chloride: 95 mmol/L — ABNORMAL LOW (ref 96–112)
Creatinine, Ser: 6.36 mg/dL — ABNORMAL HIGH (ref 0.50–1.35)
GFR calc Af Amer: 9 mL/min — ABNORMAL LOW
GFR calc non Af Amer: 8 mL/min — ABNORMAL LOW
Glucose, Bld: 102 mg/dL — ABNORMAL HIGH (ref 70–99)
Potassium: 4.3 mmol/L (ref 3.5–5.1)
Sodium: 135 mmol/L (ref 135–145)
Total Bilirubin: 1.1 mg/dL (ref 0.3–1.2)
Total Protein: 7.3 g/dL (ref 6.0–8.3)

## 2014-04-07 LAB — TROPONIN I
TROPONIN I: 0.09 ng/mL — AB (ref <0.031)
Troponin I: 0.09 ng/mL — ABNORMAL HIGH (ref <0.031)
Troponin I: 0.06 ng/mL — ABNORMAL HIGH

## 2014-04-07 LAB — MRSA PCR SCREENING: MRSA by PCR: NEGATIVE

## 2014-04-07 LAB — GLUCOSE, CAPILLARY
GLUCOSE-CAPILLARY: 89 mg/dL (ref 70–99)
Glucose-Capillary: 115 mg/dL — ABNORMAL HIGH (ref 70–99)
Glucose-Capillary: 115 mg/dL — ABNORMAL HIGH (ref 70–99)
Glucose-Capillary: 141 mg/dL — ABNORMAL HIGH (ref 70–99)
Glucose-Capillary: 158 mg/dL — ABNORMAL HIGH (ref 70–99)

## 2014-04-07 LAB — CBC
HCT: 37 % — ABNORMAL LOW (ref 39.0–52.0)
HEMOGLOBIN: 11.3 g/dL — AB (ref 13.0–17.0)
MCH: 26.5 pg (ref 26.0–34.0)
MCHC: 30.5 g/dL (ref 30.0–36.0)
MCV: 86.7 fL (ref 78.0–100.0)
Platelets: 250 10*3/uL (ref 150–400)
RBC: 4.27 MIL/uL (ref 4.22–5.81)
RDW: 15.1 % (ref 11.5–15.5)
WBC: 10.1 10*3/uL (ref 4.0–10.5)

## 2014-04-07 LAB — LIPID PANEL
Cholesterol: 141 mg/dL (ref 0–200)
HDL: 23 mg/dL — ABNORMAL LOW
LDL Cholesterol: 98 mg/dL (ref 0–99)
Total CHOL/HDL Ratio: 6.1 ratio
Triglycerides: 100 mg/dL
VLDL: 20 mg/dL (ref 0–40)

## 2014-04-07 LAB — PROCALCITONIN: Procalcitonin: 1.85 ng/mL

## 2014-04-07 LAB — APTT: aPTT: 36 s (ref 24–37)

## 2014-04-07 LAB — MAGNESIUM: MAGNESIUM: 2.2 mg/dL (ref 1.5–2.5)

## 2014-04-07 LAB — PROTIME-INR
INR: 1.18 (ref 0.00–1.49)
Prothrombin Time: 15.1 seconds (ref 11.6–15.2)

## 2014-04-07 LAB — BRAIN NATRIURETIC PEPTIDE: B Natriuretic Peptide: 522.2 pg/mL — ABNORMAL HIGH (ref 0.0–100.0)

## 2014-04-07 LAB — LACTIC ACID, PLASMA
Lactic Acid, Venous: 1.2 mmol/L (ref 0.5–2.0)
Lactic Acid, Venous: 1.6 mmol/L (ref 0.5–2.0)

## 2014-04-07 MED ORDER — SODIUM CHLORIDE 0.9 % IV BOLUS (SEPSIS)
500.0000 mL | Freq: Once | INTRAVENOUS | Status: AC
  Administered 2014-04-07: 500 mL via INTRAVENOUS

## 2014-04-07 MED ORDER — SODIUM CHLORIDE 0.9 % IV BOLUS (SEPSIS)
1250.0000 mL | Freq: Once | INTRAVENOUS | Status: AC
  Administered 2014-04-07: 1250 mL via INTRAVENOUS

## 2014-04-07 MED ORDER — PIPERACILLIN-TAZOBACTAM IN DEX 2-0.25 GM/50ML IV SOLN
2.2500 g | Freq: Once | INTRAVENOUS | Status: AC
  Administered 2014-04-07: 2.25 g via INTRAVENOUS
  Filled 2014-04-07: qty 50

## 2014-04-07 MED ORDER — LEVOTHYROXINE SODIUM 175 MCG PO TABS
175.0000 ug | ORAL_TABLET | Freq: Every day | ORAL | Status: DC
  Administered 2014-04-08: 175 ug via ORAL
  Filled 2014-04-07 (×3): qty 1

## 2014-04-07 MED ORDER — ONDANSETRON HCL 4 MG PO TABS: 4.0000 mg | ORAL_TABLET | Freq: Four times a day (QID) | ORAL | Status: DC | PRN

## 2014-04-07 MED ORDER — CALCIUM ACETATE (PHOS BINDER) 667 MG PO CAPS
1334.0000 mg | ORAL_CAPSULE | Freq: Three times a day (TID) | ORAL | Status: DC
  Administered 2014-04-07 – 2014-04-08 (×3): 1334 mg via ORAL
  Filled 2014-04-07 (×7): qty 2

## 2014-04-07 MED ORDER — HEPARIN SODIUM (PORCINE) 5000 UNIT/ML IJ SOLN
5000.0000 [IU] | Freq: Three times a day (TID) | INTRAMUSCULAR | Status: DC
  Administered 2014-04-07 (×2): 5000 [IU] via SUBCUTANEOUS
  Filled 2014-04-07 (×6): qty 1

## 2014-04-07 MED ORDER — PROMETHAZINE HCL 25 MG PO TABS: 12.5000 mg | ORAL_TABLET | Freq: Four times a day (QID) | ORAL | Status: DC | PRN

## 2014-04-07 MED ORDER — SODIUM CHLORIDE 0.9 % IV SOLN: 100.0000 mL | INTRAVENOUS | Status: DC | PRN

## 2014-04-07 MED ORDER — PIPERACILLIN-TAZOBACTAM IN DEX 2-0.25 GM/50ML IV SOLN
2.2500 g | Freq: Three times a day (TID) | INTRAVENOUS | Status: DC
  Administered 2014-04-07: 2.25 g via INTRAVENOUS
  Filled 2014-04-07 (×4): qty 50

## 2014-04-07 MED ORDER — ATORVASTATIN CALCIUM 20 MG PO TABS
20.0000 mg | ORAL_TABLET | Freq: Every day | ORAL | Status: DC
  Administered 2014-04-07: 20 mg via ORAL
  Filled 2014-04-07 (×2): qty 1

## 2014-04-07 MED ORDER — MEGESTROL ACETATE 40 MG PO TABS
40.0000 mg | ORAL_TABLET | Freq: Every day | ORAL | Status: DC
  Administered 2014-04-07 – 2014-04-08 (×2): 40 mg via ORAL
  Filled 2014-04-07 (×2): qty 1

## 2014-04-07 MED ORDER — BOOST / RESOURCE BREEZE PO LIQD
1.0000 | Freq: Three times a day (TID) | ORAL | Status: DC
  Administered 2014-04-07 – 2014-04-08 (×4): 1 via ORAL

## 2014-04-07 MED ORDER — NEPRO/CARBSTEADY PO LIQD: 237.0000 mL | ORAL | Status: DC | PRN

## 2014-04-07 MED ORDER — HEPARIN SODIUM (PORCINE) 1000 UNIT/ML DIALYSIS: 1000.0000 [IU] | INTRAMUSCULAR | Status: DC | PRN

## 2014-04-07 MED ORDER — VANCOMYCIN HCL IN DEXTROSE 750-5 MG/150ML-% IV SOLN: 750.0000 mg | INTRAVENOUS | Status: DC

## 2014-04-07 MED ORDER — COLCHICINE 0.6 MG PO TABS
0.6000 mg | ORAL_TABLET | ORAL | Status: DC
  Administered 2014-04-07: 0.6 mg via ORAL
  Filled 2014-04-07: qty 1

## 2014-04-07 MED ORDER — ASPIRIN 325 MG PO TABS
325.0000 mg | ORAL_TABLET | Freq: Once | ORAL | Status: AC
  Administered 2014-04-07: 325 mg via ORAL
  Filled 2014-04-07: qty 1

## 2014-04-07 MED ORDER — ONDANSETRON HCL 4 MG/2ML IJ SOLN: 4.0000 mg | Freq: Four times a day (QID) | INTRAMUSCULAR | Status: DC | PRN

## 2014-04-07 MED ORDER — RENA-VITE PO TABS: 1.0000 | ORAL_TABLET | Freq: Every day | ORAL | Status: DC

## 2014-04-07 MED ORDER — PENTAFLUOROPROP-TETRAFLUOROETH EX AERO: 1.0000 "application " | INHALATION_SPRAY | CUTANEOUS | Status: DC | PRN

## 2014-04-07 MED ORDER — LIDOCAINE HCL (PF) 1 % IJ SOLN: 5.0000 mL | INTRAMUSCULAR | Status: DC | PRN

## 2014-04-07 MED ORDER — SODIUM CHLORIDE 0.9 % IJ SOLN
3.0000 mL | Freq: Two times a day (BID) | INTRAMUSCULAR | Status: DC
  Administered 2014-04-07 – 2014-04-08 (×4): 3 mL via INTRAVENOUS

## 2014-04-07 MED ORDER — INSULIN ASPART 100 UNIT/ML ~~LOC~~ SOLN
0.0000 [IU] | Freq: Three times a day (TID) | SUBCUTANEOUS | Status: DC
  Administered 2014-04-07: 1 [IU] via SUBCUTANEOUS

## 2014-04-07 MED ORDER — SODIUM CHLORIDE 0.9 % IV SOLN
INTRAVENOUS | Status: DC
  Administered 2014-04-07: 05:00:00 via INTRAVENOUS

## 2014-04-07 MED ORDER — GABAPENTIN 100 MG PO CAPS
200.0000 mg | ORAL_CAPSULE | Freq: Every day | ORAL | Status: DC
  Administered 2014-04-07: 200 mg via ORAL
  Filled 2014-04-07 (×3): qty 2

## 2014-04-07 MED ORDER — RENA-VITE PO TABS
1.0000 | ORAL_TABLET | Freq: Every day | ORAL | Status: DC
  Administered 2014-04-07: 1 via ORAL
  Filled 2014-04-07 (×3): qty 1

## 2014-04-07 MED ORDER — ASPIRIN EC 81 MG PO TBEC
81.0000 mg | DELAYED_RELEASE_TABLET | Freq: Every day | ORAL | Status: DC
  Administered 2014-04-07 – 2014-04-08 (×2): 81 mg via ORAL
  Filled 2014-04-07 (×2): qty 1

## 2014-04-07 MED ORDER — NITROGLYCERIN 0.4 MG SL SUBL: 0.4000 mg | SUBLINGUAL_TABLET | SUBLINGUAL | Status: DC | PRN

## 2014-04-07 MED ORDER — LIDOCAINE-PRILOCAINE 2.5-2.5 % EX CREA: 1.0000 "application " | TOPICAL_CREAM | CUTANEOUS | Status: DC | PRN

## 2014-04-07 MED ORDER — LANTHANUM CARBONATE 500 MG PO CHEW
1000.0000 mg | CHEWABLE_TABLET | Freq: Every day | ORAL | Status: DC
  Administered 2014-04-07: 1000 mg via ORAL
  Filled 2014-04-07 (×2): qty 2

## 2014-04-07 MED ORDER — OXYCODONE-ACETAMINOPHEN 5-325 MG PO TABS: 1.0000 | ORAL_TABLET | ORAL | Status: DC | PRN

## 2014-04-07 MED ORDER — ALTEPLASE 2 MG IJ SOLR: 2.0000 mg | Freq: Once | INTRAMUSCULAR | Status: AC | PRN

## 2014-04-07 MED ORDER — SODIUM CHLORIDE 0.9 % IV BOLUS (SEPSIS): 1000.0000 mL | Freq: Once | INTRAVENOUS | Status: DC

## 2014-04-07 NOTE — Progress Notes (Signed)
INITIAL NUTRITION ASSESSMENT  Pt meets criteria for NON-SEVERE (MODERATE) MALNUTRITION in the context of chronic illness as evidenced by a 13.5% weight loss in 3 months, and mild to moderate fat and muscle mass loss.  DOCUMENTATION CODES Per approved criteria  -Non-severe (moderate) malnutrition in the context of chronic illness   INTERVENTION: Continue Resource Breeze po TID, each supplement provides 250 kcal and 9 grams of protein.  Encourage adequate PO intake.  NUTRITION DIAGNOSIS: Increased nutrient needs related to chronic illness, ESRD as evidenced by estimated nutrition needs.   Goal: Pt to meet >/= 90% of their estimated nutrition needs   Monitor:  PO intake, weight trends, labs, I/O's  Reason for Assessment: MST  69 y.o. male  Admitting Dx: Sepsis  ASSESSMENT: Pt with past medical history of ESRD on dialysis MWF, diabetes, hypertension, bilateral BKA, gout, hypothyroidism, MGUS, PVD, presents with generalized weakness.  Pt reports having a decreased appetite which has ongoing over the past 4 days. He reports he has only been able to eat 1 meal a day when compared to his usual intake of 3 meals a day with a nutritional shake once daily. Pt reports usual body weight of 150 lbs. Noted per Epic weight records, pt with a 13.5% weight loss in 3 months. No percent meal completion recorded. Pt currently has [employer] ordered and has been consuming them. Pt was offered Nepro, however declined. Will continue with current orders. Pt was encouraged to eat his food at meals and to drink his supplements.   Nutrition Focused Physical Exam:  Subcutaneous Fat:  Orbital Region: N/A Upper Arm Region: Mild to moderate depletion Thoracic and Lumbar Region: WNL  Muscle:  Temple Region: N/A Clavicle Bone Region: Mild to moderate depletion Clavicle and Acromion Bone Region: Mild depletion Scapular Bone Region: N/A Dorsal Hand: Moderate depletion Patellar Region: N/A (Pt with  bilateral BKA) Anterior Thigh Region: Moderate depletion Posterior Calf Region: N/A  Edema: none  Labs: Low chloride and GFR. High BUN and creatinine.  Height: Ht Readings from Last 1 Encounters:  03/16/14 _0  (1.778 m)    Weight: Wt Readings from Last 1 Encounters:  04/07/14 140 lb 9.6 oz (63.776 kg)    Ideal Body Weight: 144 lbs--- adjusted for bilateral BKA  % Ideal Body Weight: 97%  Wt Readings from Last 10 Encounters:  04/07/14 140 lb 9.6 oz (63.776 kg)  03/16/14 137 lb 9.6 oz (62.415 kg)  03/07/14 149 lb (67.586 kg)  02/25/14 147 lb 11.3 oz (67 kg)  02/17/14 153 lb (69.4 kg)  01/05/14 162 lb 0.6 oz (73.5 kg)  11/16/13 170 lb (77.111 kg)  09/21/13 167 lb 8.8 oz (76 kg)  08/04/13 150 lb (68.04 kg)  07/23/13 141 lb 1.5 oz (64 kg)    Usual Body Weight: 150 lbs  % Usual Body Weight: 93%  BMI:  Body mass index is 20.17 kg/(m^2).  Estimated Nutritional Needs: Kcal: 1900-2100 Protein: 85-100 grams Fluid: 1.2 L/day  Skin: Wound (MASD) on sacrum, wound on right leg  Diet Order: Diet NPO time specified  EDUCATION NEEDS: -No education needs identified at this time  No intake or output data in the 24 hours ending 04/07/14 0956  Last BM: 3/22  Labs:   Recent Labs Lab 04/06/14 2002 04/07/14 0838  NA 132* 135  K 4.2 4.3  CL 89* 95*  CO2 28 26  BUN 27* 31*  CREATININE 5.80* 6.36*  CALCIUM 9.0 8.7  GLUCOSE 197* 102*    CBG (last 3)  Recent Labs  04/07/14 0052 04/07/14 0751  GLUCAP 115* 89    Scheduled Meds: . aspirin EC  81 mg Oral Daily  . atorvastatin  20 mg Oral q1800  . calcium acetate  1,334 mg Oral TID WC  . colchicine  0.6 mg Oral Once per day on Mon Thu  . feeding supplement (RESOURCE BREEZE)  1 Container Oral TID BM  . gabapentin  200 mg Oral QHS  . heparin  5,000 Units Subcutaneous 3 times per day  . insulin aspart  0-9 Units Subcutaneous TID WC  . lanthanum  1,000 mg Oral QAC supper  . levothyroxine  175 mcg Oral QAC  breakfast  . megestrol  40 mg Oral Daily  . multivitamin  1 tablet Oral QHS  . piperacillin-tazobactam (ZOSYN)  IV  2.25 g Intravenous 3 times per day  . sodium chloride  3 mL Intravenous Q12H  . [START ON 04/08/2014] vancomycin  750 mg Intravenous Q M,W,F-HD    Continuous Infusions: . sodium chloride 125 mL/hr at 04/07/14 0440    Past Medical History  Diagnosis Date  . Hypertension   . Pneumonia     2012  . Heart murmur   . Glaucoma   . Multiple myeloma, without mention of having achieved remission 03/30/2012    Cytogenetic neg on 03/23/2012.  . Asthma   . Hyperparathyroidism, secondary renal   . Peripheral arterial disease   . End-stage renal disease on hemodialysis     Started HD March 2014.  Cause of ESRD was DM.  Gets HD at [facility] on [address] on MWF schedule.   . Thyroid disease     hyperparathyroidism  . MRSA bacteremia   . Anemia   . Peripheral vascular disease, unspecified 11/19/2012    In the past had R foot toe amps then R TMA. In 2015 had left foot toe amp > then TMA >then L BKA on 05/14/13   . History of MRSA infection 04/22/2013    Bacteremia assoc w L foot wound infection Mar 2015   . Gangrene of foot   . ESRD on hemodialysis   . Diabetes mellitus with peripheral vascular disease   . Hypothyroidism   . Decubitus ulcer of sacral region, stage 2 03/07/2014  . Glaucoma 03/07/2014    Past Surgical History  Procedure Laterality Date  . Thyroidectomy    . Cervical disc surgery    . Insertion of dialysis catheter Right 03/19/2012    Procedure: INSERTION OF DIALYSIS CATHETER;  Surgeon: Mal Misty, MD;  Location: [place];  Service: Vascular;  Laterality: Right;  Right Internal Jugular  . Av fistula placement Left 03/25/2012    Procedure: ARTERIOVENOUS (AV) FISTULA CREATION;  Surgeon: Mal Misty, MD;  Location: [place];  Service: Vascular;  Laterality: Left;  . Ligation of competing branches of arteriovenous fistula Left 05/08/2012    Procedure: LIGATION OF COMPETING  BRANCHES OF ARTERIOVENOUS FISTULA;  Surgeon: Mal Misty, MD;  Location: [place];  Service: Vascular;  Laterality: Left;  Ultrasound guided  . Cardiac catheterization      approx 30 years ago  . Amputation Right 11/10/2012    Procedure: AMPUTATION FIRST and SECOND TOES Right Foot;  Surgeon: Elam Dutch, MD;  Location: [facility] OR;  Service: Vascular;  Laterality: Right;  . Transmetatarsal amputation Left 12/16/2012    Procedure: TRANSMETATARSAL AMPUTATION AND VAC PLACEMENT;  Surgeon: Elam Dutch, MD;  Location: [place];  Service: Vascular;  Laterality: Left;  . Amputation  Left 04/07/2013    Procedure: AMPUTATION DIGIT- LEFT 1ST TOE;  Surgeon: Mal Misty, MD;  Location: [place];  Service: Vascular;  Laterality: Left;  . Tee without cardioversion N/A 04/20/2013    Procedure: TRANSESOPHAGEAL ECHOCARDIOGRAM (TEE);  Surgeon: Josue Hector, MD;  Location: [facility] ENDOSCOPY;  Service: Cardiovascular;  Laterality: N/A;  . I&d extremity Left 04/22/2013    Procedure: ULGSPJSUNH RVA DEBRIDEMENT LEFT FIRST TOE AMPUTATION WOUND ;  Surgeon: Mal Misty, MD;  Location: [place];  Service: Vascular;  Laterality: Left;  . Amputation Left 04/26/2013    Procedure: Left Foot Transmetatarsal Amputation;  Surgeon: Newt Minion, MD;  Location: [place];  Service: Orthopedics;  Laterality: Left;  . Toe amputation      D/C 04-30-13  . Below knee leg amputation Left 05/14/2013    DR [name]  . Amputation Left 05/14/2013    Procedure: AMPUTATION BELOW KNEE;  Surgeon: Newt Minion, MD;  Location: [place];  Service: Orthopedics;  Laterality: Left;  Left Below Knee Amputation  . Eye surgery Bilateral     CATARACTS  . Amputation Left 07/23/2013    Procedure: AMPUTATION BELOW KNEE;  Surgeon: Newt Minion, MD;  Location: [place];  Service: Orthopedics;  Laterality: Left;  Left Below Knee Amputation Revision  . Abdominal aortagram Bilateral 11/06/2012    Procedure: ABDOMINAL AORTAGRAM;  Surgeon: Elam Dutch, MD;  Location: [facility] CATH LAB;   Service: Cardiovascular;  Laterality: Bilateral;  . I&d extremity Right 01/05/2014    Procedure: IRRIGATION AND DEBRIDEMENT Right Heel Ulcer;  Surgeon: Mcarthur Rossetti, MD;  Location: [place];  Service: Orthopedics;  Laterality: Right;  Surgeon Available after 5PM  . Below knee leg amputation Right 02/18/2014    dr duda  . Amputation Right 02/18/2014    Procedure: AMPUTATION BELOW KNEE;  Surgeon: Newt Minion, MD;  Location: [place];  Service: Orthopedics;  Laterality: Right;    Kallie Locks, MS, RD, LDN Pager # [phone] After hours/ weekend pager # [phone]

## 2014-04-07 NOTE — Progress Notes (Signed)
MD order for patient to receive 1257ml normal saline bolus plus additional 533ml normal saline bolus. MD paged to confirm patient is to receive full 1789ml of fluid since patient is renal. Dr. Blaine Hamper stated to give 1032ml bolus and to monitor patient for respiratory distress, if no distress noted to give additional 733ml of bolus. Patient was not in respiratory distress and vital signs were stable after first bolus, so additional 735ml administered. Will continue to monitor patient.   Shelbie Hutching, RN, BSN

## 2014-04-07 NOTE — H&P (Addendum)
[facility] History and Physical  CANYON LOHR BBC:[id number] DOB: August 25, 1945 DOA: 04/06/2014  Referring physician: ED physician PCP: [name], MD  Specialists:   Chief Complaint: Generalized weakness  HPI: Jack [name] is a 69 y.o. male  with past medical history of ESRD on dialysis MWF, diabetes, hypertension, bilateral BKA, gout, hypothyroidism, MGUS, PVD, decubitus ulcer, presents with generalized weakness.  Patient reports that after finished dialysis, he started feeling very tired today. He seems to be not mentating as usual per his duaghter. He is often tired after dialysis, but today he was more tired than usual. During dialysis, he have 6L of fluids removed. Patient denies other symptoms, including fever, chills, cough, chest pain, SOB, abdominal pain, diarrhea, constipation, skin rashes or leg swelling. No unilateral weakness, numbness or tingling sensations. No vision change or hearing loss. He dose not make urine any more.  In ED, patient was found to have tachycardia, WBC 10.7, temperature 99.5, elevated troponin 0.08, creatinine 5.8, BUN 27, potassium 4.2. Chest x-ray is negative for acute abnormalities, but showed chronic elevation of right hemidiaphragm. EKG showed poor R-wave progression, S wave in V1 to V2, T-wave inversion in lead 1 and aVL, first degree AV block, occasional PVC and LAD. The patient is admitted to inpatient for further evaluation and treatment.  Review of Systems: As presented in the history of presenting illness, rest negative.  Where does patient live?  At home Can patient participate in ADLs? none Allergy:  Allergies  Allergen Reactions  . Bee Venom Anaphylaxis  . Lisinopril Cough  . Ivp Dye [Iodinated Diagnostic Agents] Hives  . Morphine And Related Hives and Other (See Comments)    Bradycardia states patient    Past Medical History  Diagnosis Date  . Hypertension   . Pneumonia     2012  . Heart murmur   . Glaucoma   .  Multiple myeloma, without mention of having achieved remission 03/30/2012    Cytogenetic neg on 03/23/2012.  . Asthma   . Hyperparathyroidism, secondary renal   . Peripheral arterial disease   . End-stage renal disease on hemodialysis     Started HD March 2014.  Cause of ESRD was DM.  Gets HD at [facility] on [address] on MWF schedule.   . Thyroid disease     hyperparathyroidism  . MRSA bacteremia   . Anemia   . Peripheral vascular disease, unspecified 11/19/2012    In the past had R foot toe amps then R TMA. In 2015 had left foot toe amp > then TMA >then L BKA on 05/14/13   . History of MRSA infection 04/22/2013    Bacteremia assoc w L foot wound infection Mar 2015   . Gangrene of foot   . ESRD on hemodialysis   . Diabetes mellitus with peripheral vascular disease   . Hypothyroidism   . Decubitus ulcer of sacral region, stage 2 03/07/2014  . Glaucoma 03/07/2014    Past Surgical History  Procedure Laterality Date  . Thyroidectomy    . Cervical disc surgery    . Insertion of dialysis catheter Right 03/19/2012    Procedure: INSERTION OF DIALYSIS CATHETER;  Surgeon: Mal Misty, MD;  Location: [place];  Service: Vascular;  Laterality: Right;  Right Internal Jugular  . Av fistula placement Left 03/25/2012    Procedure: ARTERIOVENOUS (AV) FISTULA CREATION;  Surgeon: Mal Misty, MD;  Location: [place];  Service: Vascular;  Laterality: Left;  . Ligation of competing branches of arteriovenous  fistula Left 05/08/2012    Procedure: LIGATION OF COMPETING BRANCHES OF ARTERIOVENOUS FISTULA;  Surgeon: Mal Misty, MD;  Location: [place];  Service: Vascular;  Laterality: Left;  Ultrasound guided  . Cardiac catheterization      approx 30 years ago  . Amputation Right 11/10/2012    Procedure: AMPUTATION FIRST and SECOND TOES Right Foot;  Surgeon: Elam Dutch, MD;  Location: [facility] OR;  Service: Vascular;  Laterality: Right;  . Transmetatarsal amputation Left 12/16/2012    Procedure: TRANSMETATARSAL  AMPUTATION AND VAC PLACEMENT;  Surgeon: Elam Dutch, MD;  Location: [place];  Service: Vascular;  Laterality: Left;  . Amputation Left 04/07/2013    Procedure: AMPUTATION DIGIT- LEFT 1ST TOE;  Surgeon: Mal Misty, MD;  Location: [place];  Service: Vascular;  Laterality: Left;  . Tee without cardioversion N/A 04/20/2013    Procedure: TRANSESOPHAGEAL ECHOCARDIOGRAM (TEE);  Surgeon: Josue Hector, MD;  Location: [facility] ENDOSCOPY;  Service: Cardiovascular;  Laterality: N/A;  . I&d extremity Left 04/22/2013    Procedure: CNOBSJGGEZ MOQ DEBRIDEMENT LEFT FIRST TOE AMPUTATION WOUND ;  Surgeon: Mal Misty, MD;  Location: [place];  Service: Vascular;  Laterality: Left;  . Amputation Left 04/26/2013    Procedure: Left Foot Transmetatarsal Amputation;  Surgeon: Newt Minion, MD;  Location: [place];  Service: Orthopedics;  Laterality: Left;  . Toe amputation      D/C 04-30-13  . Below knee leg amputation Left 05/14/2013    DR [name]  . Amputation Left 05/14/2013    Procedure: AMPUTATION BELOW KNEE;  Surgeon: Newt Minion, MD;  Location: [place];  Service: Orthopedics;  Laterality: Left;  Left Below Knee Amputation  . Eye surgery Bilateral     CATARACTS  . Amputation Left 07/23/2013    Procedure: AMPUTATION BELOW KNEE;  Surgeon: Newt Minion, MD;  Location: [place];  Service: Orthopedics;  Laterality: Left;  Left Below Knee Amputation Revision  . Abdominal aortagram Bilateral 11/06/2012    Procedure: ABDOMINAL AORTAGRAM;  Surgeon: Elam Dutch, MD;  Location: [facility] CATH LAB;  Service: Cardiovascular;  Laterality: Bilateral;  . I&d extremity Right 01/05/2014    Procedure: IRRIGATION AND DEBRIDEMENT Right Heel Ulcer;  Surgeon: Mcarthur Rossetti, MD;  Location: [place];  Service: Orthopedics;  Laterality: Right;  Surgeon Available after 5PM  . Below knee leg amputation Right 02/18/2014    dr duda  . Amputation Right 02/18/2014    Procedure: AMPUTATION BELOW KNEE;  Surgeon: Newt Minion, MD;  Location: [place];   Service: Orthopedics;  Laterality: Right;    Social History:  reports that he quit smoking about 32 years ago. His smoking use included Cigarettes. He has never used smokeless tobacco. He reports that he does not drink alcohol or use illicit drugs.  Family History:  Family History  Problem Relation Age of Onset  . Diabetes Mother   . Cancer Mother     bone   . Kidney disease Father      Prior to Admission medications   Medication Sig Start Date End Date Taking? Authorizing Provider  aspirin EC 81 MG tablet Take 81 mg by mouth daily.   Yes Historical Provider, MD  b complex-vitamin c-folic acid (NEPHRO-VITE) 0.8 MG TABS tablet Take 1 tablet by mouth daily.   Yes Historical Provider, MD  calcium acetate (PHOSLO) 667 MG capsule Take 1,334 mg by mouth 3 (three) times daily with meals.   Yes Historical Provider, MD  cefTAZidime 2  g in dextrose 5 % 50 mL Inject 2 g into the vein every Saturday with hemodialysis. One dose per holiday schedule 01/08/14  Yes Ripudeep K Rai, MD  cefTAZidime 2 g in dextrose 5 % 50 mL Inject 2 g into the vein every Monday, Wednesday, and Friday with hemodialysis. X 2 weeks 01/10/14  Yes Ripudeep K Rai, MD  colchicine 0.6 MG tablet Take 1 tablet (0.6 mg total) by mouth 2 (two) times a week. 01/10/14  Yes Ripudeep Krystal Eaton, MD  feeding supplement, RESOURCE BREEZE, (RESOURCE BREEZE) LIQD Take 1 Container by mouth 3 (three) times daily between meals.   Yes Historical Provider, MD  gabapentin (NEURONTIN) 100 MG capsule Take 2 capsules (200 mg total) by mouth at bedtime. 12/14/13  Yes Meredith Staggers, MD  lanthanum (FOSRENOL) 1000 MG chewable tablet Chew 1,000 mg by mouth daily.    Yes Historical Provider, MD  levothyroxine (SYNTHROID, LEVOTHROID) 175 MCG tablet Take 175 mcg by mouth daily before breakfast.   Yes Historical Provider, MD  megestrol (MEGACE) 40 MG tablet Take 40 mg by mouth daily. 01/24/14  Yes Historical Provider, MD  multivitamin (RENA-VIT) TABS tablet Take 1  tablet by mouth at bedtime.   Yes Historical Provider, MD  Oxycodone HCl 10 MG TABS Take 0.5-1 tablets (5-10 mg total) by mouth every 6 (six) hours as needed (for severe pain). 01/06/14  Yes Ripudeep Krystal Eaton, MD  oxyCODONE-acetaminophen (ROXICET) 5-325 MG per tablet Take 1 tablet by mouth every 4 (four) hours as needed for severe pain. 02/25/14  Yes Newt Minion, MD  promethazine (PHENERGAN) 12.5 MG tablet Take 1 tablet (12.5 mg total) by mouth every 6 (six) hours as needed for nausea or vomiting. 01/06/14  Yes Ripudeep Krystal Eaton, MD  Vancomycin (VANCOCIN) 750 MG/150ML SOLN Inject 150 mLs (750 mg total) into the vein every Monday, Wednesday, and Friday with hemodialysis. X 2 weeks 01/06/14   Ripudeep Krystal Eaton, MD    Physical Exam: Filed Vitals:   04/06/14 2330 04/06/14 2345 04/07/14 0000 04/07/14 0100  BP: 103/58 111/56 109/62 114/68  Pulse:    63  Temp:    99.4 F (37.4 C)  TempSrc:    Oral  Resp: _0 Weight:    63.776 kg (140 lb 9.6 oz)  SpO2:    94%   General: Not in acute distress HEENT:       Eyes: PERRL, EOMI, no scleral icterus       ENT: No discharge from the ears and nose, no pharynx injection, no tonsillar enlargement.        Neck: No JVD, no bruit, no mass felt. Cardiac: P5/W6, RRR, 3/6 systolic murmurs, No gallops or rubs Pulm: Good air movement bilaterally. Clear to auscultation bilaterally. No rales, wheezing, rhonchi or rubs. Abd: Soft, nondistended, nontender, no rebound pain, no organomegaly, BS present Ext: No leg edema bilaterally. Bilateral BKA Musculoskeletal: No joint deformities, erythema, or stiffness, ROM full Skin: Stage II sacral decubitus ulcer, not drainage Neuro: Alert and oriented X3, cranial nerves II-XII grossly intact, muscle strength 5/5 in all extremeties, sensation to light touch intact. Brachial reflex 1+ bilaterally.  Psych: Patient is not psychotic, no suicidal or hemocidal ideation.  Labs on Admission:  Basic Metabolic Panel:  Recent  Labs Lab 04/06/14 2002  NA 132*  K 4.2  CL 89*  CO2 28  GLUCOSE 197*  BUN 27*  CREATININE 5.80*  CALCIUM 9.0   Liver Function Tests: No results for input(s):  AST, ALT, ALKPHOS, BILITOT, PROT, ALBUMIN in the last 168 hours. No results for input(s): LIPASE, AMYLASE in the last 168 hours. No results for input(s): AMMONIA in the last 168 hours. CBC:  Recent Labs Lab 04/06/14 2002  WBC 10.7*  NEUTROABS 8.9*  HGB 11.8*  HCT 37.5*  MCV 84.8  PLT 262   Cardiac Enzymes:  Recent Labs Lab 04/06/14 2002  TROPONINI 0.08*    BNP (last 3 results) No results for input(s): BNP in the last 8760 hours.  ProBNP (last 3 results) No results for input(s): PROBNP in the last 8760 hours.  CBG: No results for input(s): GLUCAP in the last 168 hours.  Radiological Exams on Admission: Dg Chest 2 View  04/06/2014   CLINICAL DATA:  Atrial fibrillation. Dialysis patient with illness since dialysis.  EXAM: CHEST  2 VIEW  COMPARISON:  05/05/2013.  05/01/2013.  FINDINGS: Chronic elevation of the RIGHT hemidiaphragm with RIGHT basilar atelectasis. No airspace disease. No effusion. There are no findings to suggest volume overload. Suboptimal lateral view because the arms are not raised over head. Cardiopericardial silhouette appears similar to prior exams with tortuous atherosclerotic aorta. Monitoring leads project over the chest.  IMPRESSION: No active cardiopulmonary disease or interval change. Chronic elevation of the RIGHT hemidiaphragm.   Electronically Signed   By: Dereck Ligas M.D.   On: 04/06/2014 21:04    EKG: Independently reviewed. EKG showed poor R-wave progression, S wave in V1 to V2, T-wave inversion in lead 1 and aVL, first degree AV block, occasional PVC and LAD.  Assessment/Plan Principal Problem:   Sepsis Active Problems:   Hypertension   Hypothyroidism   End-stage renal disease on hemodialysis   Peripheral vascular disease   Monoclonal paraproteinemia   Type 1 diabetes  mellitus with diabetic foot infection   Below knee amputation status   Decubitus ulcer of sacral region, stage 2   Glaucoma   Gout   Weakness generalized  Sepsis: Patient is mildly septic on admission with mild fever with temperature 99.5, mild leukocytosis with WBC 10.7 and tachycardia. The etiology is not clear. No clear source of infection could be identified. He has stage II decubitus ulcer, which does not seem to be obviously infected. Chest x-ray is negative for pneumonia. Patient does not make urine, difficult to tell whether he may have urinary tract infection. Currently hemodynamically stable.  -Admitted to the telemetry bed -Start IV antibiotics: Vancomycin plus Zosyn -Follow-up blood culture, urinalysis if any urine can be collected. -will get Procalcitonin and trend lactic acid level -IVF: received 2L of NS bolus, followed by 125 cc/h  Elevated troponin: Patient does not have any chest pain. It is likely due to demanding ischemia secondary to sepsis. EKG showed poor R-wave progression, S wave in V1 to V2, T-wave inversion in lead 1 and aVL, first degree AV block, occasional PVC and LAD. -trop x 3 -repeat ekg in AM -ASA -start lipitor and prn NTG -No beta blocker due to first degree AV block -2d echo  Diabetes mellitus: Diet-controlled, not on medications at home. A1c 6.9 on 02/17/14. -Sliding-scale insulin  ESRD-HD (MWF): Completed dialysis today. Creatinine 5.80, BUN 27 -Consulted to renal  Gout: Stable -Continue colchicine  Hypothyroidism: TSH was 9.17 on 02/24/13 -Continue Synthroid  Decubitus ulcer-stage II -Consult wound care team   DVT ppx: SQ Heparin   Code Status: Full code Family Communication:   Yes, patient's   dagughter    at bed side Disposition Plan: Admit to inpatient   Date  of Service 04/07/2014    Ivor Costa [facility] Pager [phone]  If 7PM-7AM, please contact night-coverage [email or web address] Password [email or web address] 04/07/2014, 1:51 AM

## 2014-04-07 NOTE — Progress Notes (Signed)
ANTIBIOTIC CONSULT NOTE - INITIAL  Pharmacy Consult for vancomycin/zosyn Indication: Sepsis- unclear source  Allergies  Allergen Reactions  . Bee Venom Anaphylaxis  . Lisinopril Cough  . Ivp Dye [Iodinated Diagnostic Agents] Hives  . Morphine And Related Hives and Other (See Comments)    Bradycardia states patient    Patient Measurements: Weight: 140 lb 9.6 oz (63.776 kg) HT 93f 10 in  Vital Signs: Temp: 99.4 F (37.4 C) (03/24 0100) Temp Source: Oral (03/24 0100) BP: 114/68 mmHg (03/24 0100) Pulse Rate: 63 (03/24 0100) Intake/Output from previous day:   Intake/Output from this shift:    Labs:  Recent Labs  04/06/14 2002  WBC 10.7*  HGB 11.8*  PLT 262  CREATININE 5.80*   Estimated Creatinine Clearance: 10.7 mL/min (by C-G formula based on Cr of 5.8). No results for input(s): VANCOTROUGH, VANCOPEAK, VANCORANDOM, GENTTROUGH, GENTPEAK, GENTRANDOM, TOBRATROUGH, TOBRAPEAK, TOBRARND, AMIKACINPEAK, AMIKACINTROU, AMIKACIN in the last 72 hours.   Microbiology: No results found for this or any previous visit (from the past 720 hour(s)).  Medical History: Past Medical History  Diagnosis Date  . Hypertension   . Pneumonia     2012  . Heart murmur   . Glaucoma   . Multiple myeloma, without mention of having achieved remission 03/30/2012    Cytogenetic neg on 03/23/2012.  . Asthma   . Hyperparathyroidism, secondary renal   . Peripheral arterial disease   . End-stage renal disease on hemodialysis     Started HD March 2014.  Cause of ESRD was DM.  [facility].   . Thyroid disease     hyperparathyroidism  . MRSA bacteremia   . Anemia   . Peripheral vascular disease, unspecified 11/19/2012    In the past had R foot toe amps then R TMA. In 2015 had left foot toe amp > then TMA >then L BKA on 05/14/13   . History of MRSA infection 04/22/2013    Bacteremia assoc w L foot wound infection Mar 2015   . Gangrene of foot   . ESRD on  hemodialysis   . Diabetes mellitus with peripheral vascular disease   . Hypothyroidism   . Decubitus ulcer of sacral region, stage 2 03/07/2014  . Glaucoma 03/07/2014    Assessment: 69y.o male went to dialysis in morning on 04/06/14 and "has not been feeling well" ever since. Pt showing A-fib during transport with HR 80-110.Pharmacy consulted to dose vancomycin and zosyn IV antibiotics for sepsis-unclear source.   Goal of Therapy:  Pre HD vancomycin level =15-25 mcg/ml  Plan:  Vanc 12588mIV x1 ordered , then 75069mHD mwf- f/u to confirm next HD schedule Zosyn 2.25g IV q8h  RutNicole CellaPh Clinical Pharmacist Pager: [phone]/2016,1:17 AM

## 2014-04-07 NOTE — Progress Notes (Signed)
PT Cancellation Note  Patient Details Name: Jack [name] MRN: [id number] DOB: [birth year]   Cancelled Treatment:    Reason Eval/Treat Not Completed: Medical issues which prohibited therapy. Pt with rising troponins initially thought to be due to demand ischemia due to sepsis.  Latest MD note indicated pt. May not be septic.  Will defer PT eval today and will follow up tomorrow.     Ladona Ridgel 04/07/2014, 10:50 AM Gerlean Ren PT Acute Rehab Services [phone] [place] [phone]

## 2014-04-07 NOTE — Progress Notes (Signed)
Admission note:  Arrival Method: On stretcher from ED with RN Mental Orientation: Alert and oriented x 4 Telemetry: Telemetry box 1 applied, CCMD notified.  Assessment: Completed  Skin: MASD to sacrum, pink foam dressing applied. Bilateral BKA IV: Right forearm IV with NS @ 125. Clean, dry and intact Pain: States no pain at this time Tubes: Left upper arm fistula, positive for thrill and bruit  Safety Measures: Bed alarm activated, call light within reach Fall Prevention Safety Plan: Reviewed with patient Admission Screening: Completed  6700 Orientation: Patient has been oriented to the unit, staff and to the room.  Patient lying comfortably in bed with no complaints at this time. Call light is within reach, will continue to monitor.   Shelbie Hutching, RN, BNS

## 2014-04-07 NOTE — Consult Note (Signed)
[facility] Renal Consultation Note  Indication for Consultation:  Management of ESRD/hemodialysis; anemia, hypertension/volume and secondary hyperparathyroidism  HPI: Jack [name] is a 69 y.o. male admitted last pm by [facility] with weakness/ mildly  Septic with temp 99.5, wbc 10.7/ tachcardia with  unknown etiology CXR neg  For PNA. He reports feeling very weak after going home from his Dialysis treatment yesterday with 6.2 liter goal bp pre 152/90 and post 99/80  AND noted he missed  His Monday tx 04/04/14 "felt too weak  To go". Also noted he has missed every Monday HD tx in March 21, 14, 07 and tues 09. Yesterday per  HD RN was lethargic ,afebrile and no problems using his AVF. He denies N/V/D, sob, chest pain, pain in his bilat BKA's with out any reported skin  Ulcers. Now in [place] room without cos' feeling better,and pleasantly confused to place and date. Also he has ho MM without mention of remissionCytogenetic neg on 03/23/2012.  [place] 02/25/14 with Osteom RBKA has finished antibiotics..                   Past Medical History  Diagnosis Date  . Hypertension   . Pneumonia     2012  . Heart murmur   . Glaucoma   . Multiple myeloma, without mention of having achieved remission 03/30/2012    Cytogenetic neg on 03/23/2012.  . Asthma   . Hyperparathyroidism, secondary renal   . Peripheral arterial disease   . End-stage renal disease on hemodialysis     Started HD March 2014.  Cause of ESRD was DM.  Gets HD at [facility] [address] on MWF schedule.   . Thyroid disease     hyperparathyroidism  . MRSA bacteremia   . Anemia   . Peripheral vascular disease, unspecified 11/19/2012    In the past had R foot toe amps then R TMA. In 2015 had left foot toe amp > then TMA >then L BKA on 05/14/13   . History of MRSA infection 04/22/2013    Bacteremia assoc w L foot wound infection Mar 2015   . Gangrene of foot   . ESRD on hemodialysis   . Diabetes mellitus with peripheral  vascular disease   . Hypothyroidism   . Decubitus ulcer of sacral region, stage 2 03/07/2014  . Glaucoma 03/07/2014    Past Surgical History  Procedure Laterality Date  . Thyroidectomy    . Cervical disc surgery    . Insertion of dialysis catheter Right 03/19/2012    Procedure: INSERTION OF DIALYSIS CATHETER;  Surgeon: Mal Misty, MD;  Location: [place];  Service: Vascular;  Laterality: Right;  Right Internal Jugular  . Av fistula placement Left 03/25/2012    Procedure: ARTERIOVENOUS (AV) FISTULA CREATION;  Surgeon: Mal Misty, MD;  Location: [place];  Service: Vascular;  Laterality: Left;  . Ligation of competing branches of arteriovenous fistula Left 05/08/2012    Procedure: LIGATION OF COMPETING BRANCHES OF ARTERIOVENOUS FISTULA;  Surgeon: Mal Misty, MD;  Location: [place];  Service: Vascular;  Laterality: Left;  Ultrasound guided  . Cardiac catheterization      approx 30 years ago  . Amputation Right 11/10/2012    Procedure: AMPUTATION FIRST and SECOND TOES Right Foot;  Surgeon: Elam Dutch, MD;  Location: [place];  Service: Vascular;  Laterality: Right;  . Transmetatarsal amputation Left 12/16/2012    Procedure: TRANSMETATARSAL AMPUTATION AND VAC PLACEMENT;  Surgeon: Juanda Crumble  Antony Blackbird, MD;  Location: [place];  Service: Vascular;  Laterality: Left;  . Amputation Left 04/07/2013    Procedure: AMPUTATION DIGIT- LEFT 1ST TOE;  Surgeon: Mal Misty, MD;  Location: [place];  Service: Vascular;  Laterality: Left;  . Tee without cardioversion N/A 04/20/2013    Procedure: TRANSESOPHAGEAL ECHOCARDIOGRAM (TEE);  Surgeon: Josue Hector, MD;  Location[facility] ENDOSCOPY;  Service: Cardiovascular;  Laterality: N/A;  . I&d extremity Left 04/22/2013    Procedure: MVEHMCNOBS JGG DEBRIDEMENT LEFT FIRST TOE AMPUTATION WOUND ;  Surgeon: Mal Misty, MD;  Location: [place];  Service: Vascular;  Laterality: Left;  . Amputation Left 04/26/2013    Procedure: Left Foot Transmetatarsal Amputation;  Surgeon: Newt Minion, MD;  Location: [place];  Service: Orthopedics;  Laterality: Left;  . Toe amputation      D/C 04-30-13  . Below knee leg amputation Left 05/14/2013    DR [name]  . Amputation Left 05/14/2013    Procedure: AMPUTATION BELOW KNEE;  Surgeon: Newt Minion, MD;  Location: [place];  Service: Orthopedics;  Laterality: Left;  Left Below Knee Amputation  . Eye surgery Bilateral     CATARACTS  . Amputation Left 07/23/2013    Procedure: AMPUTATION BELOW KNEE;  Surgeon: Newt Minion, MD;  Location: [place];  Service: Orthopedics;  Laterality: Left;  Left Below Knee Amputation Revision  . Abdominal aortagram Bilateral 11/06/2012    Procedure: ABDOMINAL AORTAGRAM;  Surgeon: Elam Dutch, MD;  Location: [facility] CATH LAB;  Service: Cardiovascular;  Laterality: Bilateral;  . I&d extremity Right 01/05/2014    Procedure: IRRIGATION AND DEBRIDEMENT Right Heel Ulcer;  Surgeon: Mcarthur Rossetti, MD;  Location: [place];  Service: Orthopedics;  Laterality: Right;  Surgeon Available after 5PM  . Below knee leg amputation Right 02/18/2014    dr duda  . Amputation Right 02/18/2014    Procedure: AMPUTATION BELOW KNEE;  Surgeon: Newt Minion, MD;  Location: [place];  Service: Orthopedics;  Laterality: Right;      Family History  Problem Relation Age of Onset  . Diabetes Mother   . Cancer Mother     bone   . Kidney disease Father       reports that he quit smoking about 32 years ago. His smoking use included Cigarettes. He has never used smokeless tobacco. He reports that he does not drink alcohol or use illicit drugs.   Allergies  Allergen Reactions  . Bee Venom Anaphylaxis  . Lisinopril Cough  . Ivp Dye [Iodinated Diagnostic Agents] Hives  . Morphine And Related Hives and Other (See Comments)    Bradycardia states patient    Prior to Admission medications   Medication Sig Start Date End Date Taking? Authorizing Provider  aspirin EC 81 MG tablet Take 81 mg by mouth daily.   Yes Historical Provider, MD   calcium acetate (PHOSLO) 667 MG capsule Take 1,334 mg by mouth 3 (three) times daily with meals.   Yes Historical Provider, MD  cefTAZidime 2 g in dextrose 5 % 50 mL Inject 2 g into the vein every Saturday with hemodialysis. One dose per holiday schedule 01/08/14  Yes Ripudeep K Rai, MD  cefTAZidime 2 g in dextrose 5 % 50 mL Inject 2 g into the vein every Monday, Wednesday, and Friday with hemodialysis. X 2 weeks 01/10/14  Yes Ripudeep K Rai, MD  feeding supplement, RESOURCE BREEZE, (RESOURCE BREEZE) LIQD Take 1 Container by mouth 3 (three) times daily between  meals.   Yes Historical Provider, MD  lanthanum (FOSRENOL) 1000 MG chewable tablet Chew 1,000 mg by mouth daily.    Yes Historical Provider, MD  levothyroxine (SYNTHROID, LEVOTHROID) 175 MCG tablet Take 175 mcg by mouth daily before breakfast.   Yes Historical Provider, MD  megestrol (MEGACE) 40 MG tablet Take 40 mg by mouth daily. 01/24/14  Yes Historical Provider, MD  multivitamin (RENA-VIT) TABS tablet Take 1 tablet by mouth at bedtime.   Yes Historical Provider, MD  oxyCODONE-acetaminophen (ROXICET) 5-325 MG per tablet Take 1 tablet by mouth every 4 (four) hours as needed for severe pain. 02/25/14  Yes Newt Minion, MD  promethazine (PHENERGAN) 12.5 MG tablet Take 1 tablet (12.5 mg total) by mouth every 6 (six) hours as needed for nausea or vomiting. 01/06/14  Yes Ripudeep Krystal Eaton, MD  Vancomycin (VANCOCIN) 750 MG/150ML SOLN Inject 150 mLs (750 mg total) into the vein every Monday, Wednesday, and Friday with hemodialysis. X 2 weeks 01/06/14  Yes Ripudeep K Rai, MD  colchicine 0.6 MG tablet Take 1 tablet (0.6 mg total) by mouth 2 (two) times a week. Patient not taking: Reported on 04/07/2014 01/10/14   Ripudeep Krystal Eaton, MD  gabapentin (NEURONTIN) 100 MG capsule Take 2 capsules (200 mg total) by mouth at bedtime. Patient not taking: Reported on 04/07/2014 12/14/13   Meredith Staggers, MD  Oxycodone HCl 10 MG TABS Take 0.5-1 tablets (5-10 mg total) by  mouth every 6 (six) hours as needed (for severe pain). Patient not taking: Reported on 04/07/2014 01/06/14   Ripudeep Krystal Eaton, MD     Anti-infectives    Start     Dose/Rate Route Frequency Ordered Stop   04/08/14 1200  vancomycin (VANCOCIN) IVPB 750 mg/150 ml premix     750 mg 150 mL/hr over 60 Minutes Intravenous Every M-W-F (Hemodialysis) 04/07/14 0107     04/07/14 0700  piperacillin-tazobactam (ZOSYN) IVPB 2.25 g     2.25 g 100 mL/hr over 30 Minutes Intravenous 3 times per day 04/07/14 0106     04/07/14 0115  piperacillin-tazobactam (ZOSYN) IVPB 2.25 g     2.25 g 100 mL/hr over 30 Minutes Intravenous  Once 04/07/14 0106 04/07/14 0508   04/07/14 0015  vancomycin (VANCOCIN) 1,250 mg in sodium chloride 0.9 % 250 mL IVPB     1,250 mg 166.7 mL/hr over 90 Minutes Intravenous  Once 04/06/14 2355 04/07/14 1610      Results for orders placed or performed during the hospital encounter of 04/06/14 (from the past 48 hour(s))  CBC with Differential/Platelet     Status: Abnormal   Collection Time: 04/06/14  8:02 PM  Result Value Ref Range   WBC 10.7 (H) 4.0 - 10.5 K/uL   RBC 4.42 4.22 - 5.81 MIL/uL   Hemoglobin 11.8 (L) 13.0 - 17.0 g/dL   HCT 37.5 (L) 39.0 - 52.0 %   MCV 84.8 78.0 - 100.0 fL   MCH 26.7 26.0 - 34.0 pg   MCHC 31.5 30.0 - 36.0 g/dL   RDW 15.0 11.5 - 15.5 %   Platelets 262 150 - 400 K/uL   Neutrophils Relative % 83 (H) 43 - 77 %   Neutro Abs 8.9 (H) 1.7 - 7.7 K/uL   Lymphocytes Relative 11 (L) 12 - 46 %   Lymphs Abs 1.2 0.7 - 4.0 K/uL   Monocytes Relative 5 3 - 12 %   Monocytes Absolute 0.5 0.1 - 1.0 K/uL   Eosinophils Relative 1 0 - 5 %  Eosinophils Absolute 0.1 0.0 - 0.7 K/uL   Basophils Relative 0 0 - 1 %   Basophils Absolute 0.0 0.0 - 0.1 K/uL  Basic metabolic panel     Status: Abnormal   Collection Time: 04/06/14  8:02 PM  Result Value Ref Range   Sodium 132 (L) 135 - 145 mmol/L   Potassium 4.2 3.5 - 5.1 mmol/L   Chloride 89 (L) 96 - 112 mmol/L   CO2 28 19 - 32  mmol/L   Glucose, Bld 197 (H) 70 - 99 mg/dL   BUN 27 (H) 6 - 23 mg/dL   Creatinine, Ser 5.80 (H) 0.50 - 1.35 mg/dL   Calcium 9.0 8.4 - 10.5 mg/dL   GFR calc non Af Amer 9 (L) >90 mL/min   GFR calc Af Amer 10 (L) >90 mL/min    Comment: (NOTE) The eGFR has been calculated using the CKD EPI equation. This calculation has not been validated in all clinical situations. eGFR's persistently <90 mL/min signify possible Chronic Kidney Disease.    Anion gap 15 5 - 15  Troponin I     Status: Abnormal   Collection Time: 04/06/14  8:02 PM  Result Value Ref Range   Troponin I 0.08 (H) <0.031 ng/mL    Comment:        PERSISTENTLY INCREASED TROPONIN VALUES IN THE RANGE OF 0.04-0.49 ng/mL CAN BE SEEN IN:       -UNSTABLE ANGINA       -CONGESTIVE HEART FAILURE       -MYOCARDITIS       -CHEST TRAUMA       -ARRYHTHMIAS       -LATE PRESENTING MYOCARDIAL INFARCTION       -COPD   CLINICAL FOLLOW-UP RECOMMENDED.   I-stat troponin, ED     Status: Abnormal   Collection Time: 04/06/14  8:36 PM  Result Value Ref Range   Troponin i, poc 0.09 (HH) 0.00 - 0.08 ng/mL   Comment NOTIFIED PHYSICIAN    Comment 3            Comment: Due to the release kinetics of cTnI, a negative result within the first hours of the onset of symptoms does not rule out myocardial infarction with certainty. If myocardial infarction is still suspected, repeat the test at appropriate intervals.   Glucose, capillary     Status: Abnormal   Collection Time: 04/07/14 12:52 AM  Result Value Ref Range   Glucose-Capillary 115 (H) 70 - 99 mg/dL  Troponin I (q 6hr x 3)     Status: Abnormal   Collection Time: 04/07/14  3:20 AM  Result Value Ref Range   Troponin I 0.09 (H) <0.031 ng/mL    Comment:        PERSISTENTLY INCREASED TROPONIN VALUES IN THE RANGE OF 0.04-0.49 ng/mL CAN BE SEEN IN:       -UNSTABLE ANGINA       -CONGESTIVE HEART FAILURE       -MYOCARDITIS       -CHEST TRAUMA       -ARRYHTHMIAS       -LATE PRESENTING  MYOCARDIAL INFARCTION       -COPD   CLINICAL FOLLOW-UP RECOMMENDED.   Brain natriuretic peptide     Status: Abnormal   Collection Time: 04/07/14  3:20 AM  Result Value Ref Range   B Natriuretic Peptide 522.2 (H) 0.0 - 100.0 pg/mL  Lipid panel     Status: Abnormal   Collection Time: 04/07/14  3:20 AM  Result Value Ref Range   Cholesterol 141 0 - 200 mg/dL   Triglycerides 100 <150 mg/dL   HDL 23 (L) >39 mg/dL   Total CHOL/HDL Ratio 6.1 RATIO   VLDL 20 0 - 40 mg/dL   LDL Cholesterol 98 0 - 99 mg/dL    Comment:        Total Cholesterol/HDL:CHD Risk Coronary Heart Disease Risk Table                     Men   Women  1/2 Average Risk   3.4   3.3  Average Risk       5.0   4.4  2 X Average Risk   9.6   7.1  3 X Average Risk  23.4   11.0        Use the calculated Patient Ratio above and the CHD Risk Table to determine the patient's CHD Risk.        ATP III CLASSIFICATION (LDL):  <100     mg/dL   Optimal  100-129  mg/dL   Near or Above                    Optimal  130-159  mg/dL   Borderline  160-189  mg/dL   High  >190     mg/dL   Very High   Lactic acid, plasma     Status: None   Collection Time: 04/07/14  3:20 AM  Result Value Ref Range   Lactic Acid, Venous 1.2 0.5 - 2.0 mmol/L  Procalcitonin     Status: None   Collection Time: 04/07/14  3:20 AM  Result Value Ref Range   Procalcitonin 1.85 ng/mL    Comment:        Interpretation: PCT > 0.5 ng/mL and <= 2 ng/mL: Systemic infection (sepsis) is possible, but other conditions are known to elevate PCT as well. (NOTE)         ICU PCT Algorithm               Non ICU PCT Algorithm    ----------------------------     ------------------------------         PCT < 0.25 ng/mL                 PCT < 0.1 ng/mL     Stopping of antibiotics            Stopping of antibiotics       strongly encouraged.               strongly encouraged.    ----------------------------     ------------------------------       PCT level decrease by                PCT < 0.25 ng/mL       >= 80% from peak PCT       OR PCT 0.25 - 0.5 ng/mL          Stopping of antibiotics                                             encouraged.     Stopping of antibiotics           encouraged.    ----------------------------     ------------------------------       PCT level decrease  by              PCT >= 0.25 ng/mL       < 80% from peak PCT        AND PCT >= 0.5 ng/mL             Continuing antibiotics                                              encouraged.       Continuing antibiotics            encouraged.    ----------------------------     ------------------------------     PCT level increase compared          PCT > 0.5 ng/mL         with peak PCT AND          PCT >= 0.5 ng/mL             Escalation of antibiotics                                          strongly encouraged.      Escalation of antibiotics        strongly encouraged.   Protime-INR     Status: None   Collection Time: 04/07/14  3:20 AM  Result Value Ref Range   Prothrombin Time 15.1 11.6 - 15.2 seconds   INR 1.18 0.00 - 1.49  APTT     Status: None   Collection Time: 04/07/14  3:20 AM  Result Value Ref Range   aPTT 36 24 - 37 seconds  MRSA PCR Screening     Status: None   Collection Time: 04/07/14  7:28 AM  Result Value Ref Range   MRSA by PCR NEGATIVE NEGATIVE    Comment:        The GeneXpert MRSA Assay (FDA approved for NASAL specimens only), is one component of a comprehensive MRSA colonization surveillance program. It is not intended to diagnose MRSA infection nor to guide or monitor treatment for MRSA infections.   Glucose, capillary     Status: None   Collection Time: 04/07/14  7:51 AM  Result Value Ref Range   Glucose-Capillary 89 70 - 99 mg/dL  CBC     Status: Abnormal   Collection Time: 04/07/14  8:38 AM  Result Value Ref Range   WBC 10.1 4.0 - 10.5 K/uL   RBC 4.27 4.22 - 5.81 MIL/uL   Hemoglobin 11.3 (L) 13.0 - 17.0 g/dL   HCT 37.0 (L) 39.0 - 52.0 %    MCV 86.7 78.0 - 100.0 fL   MCH 26.5 26.0 - 34.0 pg   MCHC 30.5 30.0 - 36.0 g/dL   RDW 15.1 11.5 - 15.5 %   Platelets 250 150 - 400 K/uL     ROS: See hpi for positives  Physical Exam: Filed Vitals:   04/07/14 0749  BP: 140/82  Pulse: 77  Temp: 98.9 F (37.2 C)  Resp: 18     General: Jack [name] asleep awoken ,some what lethargic  ND , can not tell me his location , or date,  HEENT: [place] Nonicteric  mmdry Neck: supple, no jvd Heart: RRR 2/6 sem lsb Lungs: CTA bilat nonlaboured  breathing Abdomen: BS pos,. Soft , nontender,ND Extremities: Bilat BKAS' L healed, R wrapping not remioved Skin: no overt rash  Neuro: moves all extrem , lethargic  Disoriented to Place and time Dialysis Access: pos bruit LUA AVF  Dialysis Orders: Center: [facility] on MWF . EDW 63 kg HD Bath 2.0 k, 2.0 ca   Time 4hr Heparin 2,000. Access LUA Avf     Hectorol 0 mcg IV/HD/ esa 0   Units IV/HD  Venofer  0  Other op labs  hgb 12.0 / ca 9.2 phos 3.6  pth 237  Assessment/Plan 1. Acute gen weakness / borderline hypotension/ lowgrade temp - unclear cause, looks better today s/p 2L IVF's.  2. ESRD -  MWF HD 3. Hypertension/volume  - BP stable now, at dry wt 4. Anemia  -  hgb .11 no esa as op  5. Metabolic bone disease - cont binder 6.  HO MM  Ernest Haber, PA-C [place] [phone] 04/07/2014, 9:36 AM   Pt seen, examined and agree w A/P as above.  Patient presented with acute gen weakness and  Borderline hypotension yesterday, occurred at HD.  Got IVF" s 2L and today looks good, back to baseline. Agree w holding abx, observe.  HD tomorrow, UF to dry wt 63kg.  [name] MD pager [phone]    cell [phone] 04/07/2014, 12:51 PM

## 2014-04-07 NOTE — Consult Note (Signed)
WOC wound consult note Reason for Consult: evaluation of sacral pressure ulcer. However at the time of my assessment the skin over the sacrum is intact with some evidence of prior breakdown.  Now reepithelialized.  Some superficial skin peeling from moisture, pt is incontinent of B/B.   Wound type: MASD (Moisture associated skin damage)  Pressure Ulcer POA:No Measurement: affected area is 6x6 but is not broken, skin intact with skin peeling  Wound bed: pink, intact, superficial skin slouging Drainage (amount, consistency, odor) none Periwound:intact  Dressing procedure/placement/frequency: Silicone foam to protect the area unless this dressing begins to be soiled frequently from stool or urine.  IF this becomes an issue will need to switch to zinc based barrier cream instead.    Discussed POC with patient and bedside nurse.  Re consult if needed, will not follow at this time. Thanks  Jack [name], Jack [name] [phone])

## 2014-04-07 NOTE — Progress Notes (Signed)
Patient admitted after midnight. Chart reviewed. Patient examined. No fevers. Blood pressure stable. Will stop IV fluids and monitor off antibiotics, as I am not certain patient is septic. Noted to have dark eschar over right BKA stump no fluctuance or cellulitis noted. May need enzymatic debridement cream or other. Will reconsult wound care.  Doree Barthel, MD [facility] [email or web address], password [facility]

## 2014-04-08 DIAGNOSIS — Z89519 Acquired absence of unspecified leg below knee: Secondary | ICD-10-CM

## 2014-04-08 DIAGNOSIS — I10 Essential (primary) hypertension: Secondary | ICD-10-CM

## 2014-04-08 LAB — HEPATITIS B SURFACE ANTIGEN: Hepatitis B Surface Ag: NEGATIVE

## 2014-04-08 LAB — RENAL FUNCTION PANEL
Albumin: 2.4 g/dL — ABNORMAL LOW (ref 3.5–5.2)
Anion gap: 13 (ref 5–15)
BUN: 45 mg/dL — ABNORMAL HIGH (ref 6–23)
CALCIUM: 9 mg/dL (ref 8.4–10.5)
CO2: 24 mmol/L (ref 19–32)
CREATININE: 8.04 mg/dL — AB (ref 0.50–1.35)
Chloride: 100 mmol/L (ref 96–112)
GFR calc Af Amer: 7 mL/min — ABNORMAL LOW (ref >90)
GFR, EST NON AFRICAN AMERICAN: 6 mL/min — AB (ref >90)
Glucose, Bld: 105 mg/dL — ABNORMAL HIGH (ref 70–99)
POTASSIUM: 4.1 mmol/L (ref 3.5–5.1)
Phosphorus: 5.7 mg/dL — ABNORMAL HIGH (ref 2.3–4.6)
Sodium: 137 mmol/L (ref 135–145)

## 2014-04-08 LAB — CBC
HEMATOCRIT: 32.8 % — AB (ref 39.0–52.0)
Hemoglobin: 10 g/dL — ABNORMAL LOW (ref 13.0–17.0)
MCH: 25.8 pg — AB (ref 26.0–34.0)
MCHC: 30.5 g/dL (ref 30.0–36.0)
MCV: 84.5 fL (ref 78.0–100.0)
PLATELETS: 224 10*3/uL (ref 150–400)
RBC: 3.88 MIL/uL — ABNORMAL LOW (ref 4.22–5.81)
RDW: 14.8 % (ref 11.5–15.5)
WBC: 7.4 10*3/uL (ref 4.0–10.5)

## 2014-04-08 LAB — GLUCOSE, CAPILLARY: GLUCOSE-CAPILLARY: 91 mg/dL (ref 70–99)

## 2014-04-08 NOTE — Discharge Summary (Signed)
Physician Discharge Summary  Jack [name] NWG:[id number] DOB: 11-12-1945 DOA: 04/06/2014  PCP: [name], MD  Admit date: 04/06/2014 Discharge date: 04/08/2014  Time spent: greater than 30 minutes  Discharge Diagnoses:  Principal Problem:   Acute encephalopathy Active Problems:   Hypertension   Hypothyroidism   End-stage renal disease on hemodialysis   Peripheral vascular disease   Monoclonal paraproteinemia   Type 1 diabetes mellitus with diabetic foot infection   Below knee amputation status   Decubitus ulcer of sacral region, stage 2   Glaucoma   Gout   Weakness generalized   Fatigue   Discharge Condition: stable  Diet recommendation: renal  Filed Weights   04/08/14 0222 04/08/14 0720 04/08/14 1142  Weight: 69.037 kg (152 lb 3.2 oz) 66.2 kg (145 lb 15.1 oz) 64.7 kg (142 lb 10.2 oz)    History of present illness:   69 y.o. male with past medical history of ESRD on dialysis MWF, diabetes, hypertension, bilateral BKA, gout, hypothyroidism, MGUS, PVD, decubitus ulcer, presents with generalized weakness.  Patient reports that after finished dialysis, he started feeling very tired today. He seems to be not mentating as usual per his duaghter. He is often tired after dialysis, but today he was more tired than usual. During dialysis, he have 6L of fluids removed. Patient denies other symptoms, including fever, chills, cough, chest pain, SOB, abdominal pain, diarrhea, constipation, skin rashes or leg swelling. No unilateral weakness, numbness or tingling sensations. No vision change or hearing loss. He dose not make urine any more.  In ED, patient was found to have tachycardia, WBC 10.7, temperature 99.5, elevated troponin 0.08, creatinine 5.8, BUN 27, potassium 4.2. Chest x-ray is negative for acute abnormalities, but showed chronic elevation of right hemidiaphragm. EKG showed poor R-wave progression, S wave in V1 to V2, T-wave inversion in lead 1 and aVL, first degree AV  block, occasional PVC and LAD. The patient is admitted to inpatient for further evaluation and treatment.  Hospital Course:  Initial concerns about sepsis. Started on empiric antibiotics and fluids. Mental status normalized, as did vital signs. No fever, symptoms or clinical signs of infection. Antibiotics stopped and patient remained stable.  Somnolence and tachycardia likely from too low of dry weight, adjusted by nephrology.  BKA stump noted to have eschar. Wound care nurse discussed with Dr. Sharol Given who recommended stump shrinker and no other treatment. He will follow-up in the office.  Procedures: None  Consultations:  Nephrology  Discharge Exam: Filed Vitals:   04/08/14 1227  BP: 101/80  Pulse: 101  Temp: 98.2 F (36.8 C)  Resp: 14    General: a and o Cardiovascular: RRR Respiratory: CTA  Discharge Instructions   Discharge Instructions    Discharge instructions    Complete by:  As directed   Renal diet     Walk with assistance    Complete by:  As directed           Current Discharge Medication List    CONTINUE these medications which have NOT CHANGED   Details  aspirin EC 81 MG tablet Take 81 mg by mouth daily.    calcium acetate (PHOSLO) 667 MG capsule Take 1,334 mg by mouth 3 (three) times daily with meals.    feeding supplement, RESOURCE BREEZE, (RESOURCE BREEZE) LIQD Take 1 Container by mouth 3 (three) times daily between meals.    lanthanum (FOSRENOL) 1000 MG chewable tablet Chew 1,000 mg by mouth daily.     levothyroxine (SYNTHROID, LEVOTHROID) 175 MCG  tablet Take 175 mcg by mouth daily before breakfast.    megestrol (MEGACE) 40 MG tablet Take 40 mg by mouth daily. Refills: 11    multivitamin (RENA-VIT) TABS tablet Take 1 tablet by mouth at bedtime.    oxyCODONE-acetaminophen (ROXICET) 5-325 MG per tablet Take 1 tablet by mouth every 4 (four) hours as needed for severe pain. Qty: 60 tablet, Refills: 0    promethazine (PHENERGAN) 12.5 MG tablet  Take 1 tablet (12.5 mg total) by mouth every 6 (six) hours as needed for nausea or vomiting. Qty: 30 tablet, Refills: 0      STOP taking these medications     colchicine 0.6 MG tablet      gabapentin (NEURONTIN) 100 MG capsule        Allergies  Allergen Reactions  . Bee Venom Anaphylaxis  . Lisinopril Cough  . Ivp Dye [Iodinated Diagnostic Agents] Hives  . Morphine And Related Hives and Other (See Comments)    Bradycardia states patient   Follow-up Information    Follow up with [name], MD.   Specialty:  Family Medicine   Why:  As needed   Contact information:   [address] [place] [address] [phone]        The results of significant diagnostics from this hospitalization (including imaging, microbiology, ancillary and laboratory) are listed below for reference.    Significant Diagnostic Studies: Dg Chest 2 View  04/06/2014   CLINICAL DATA:  Atrial fibrillation. Dialysis patient with illness since dialysis.  EXAM: CHEST  2 VIEW  COMPARISON:  05/05/2013.  05/01/2013.  FINDINGS: Chronic elevation of the RIGHT hemidiaphragm with RIGHT basilar atelectasis. No airspace disease. No effusion. There are no findings to suggest volume overload. Suboptimal lateral view because the arms are not raised over head. Cardiopericardial silhouette appears similar to prior exams with tortuous atherosclerotic aorta. Monitoring leads project over the chest.  IMPRESSION: No active cardiopulmonary disease or interval change. Chronic elevation of the RIGHT hemidiaphragm.   Electronically Signed   By: Dereck Ligas M.D.   On: 04/06/2014 21:04    Microbiology: Recent Results (from the past 240 hour(s))  Culture, blood (x 2)     Status: None (Preliminary result)   Collection Time: 04/07/14  3:20 AM  Result Value Ref Range Status   Specimen Description BLOOD RIGHT ARM  Final   Special Requests BOTTLES DRAWN AEROBIC ONLY 10 CC  Final   Culture   Final           BLOOD CULTURE  RECEIVED NO GROWTH TO DATE CULTURE WILL BE HELD FOR 5 DAYS BEFORE ISSUING A FINAL NEGATIVE REPORT Performed at [facility]    Report Status PENDING  Incomplete  Culture, blood (x 2)     Status: None (Preliminary result)   Collection Time: 04/07/14  3:40 AM  Result Value Ref Range Status   Specimen Description BLOOD RIGHT WRIST  Final   Special Requests BOTTLES DRAWN AEROBIC ONLY 3 CC  Final   Culture   Final           BLOOD CULTURE RECEIVED NO GROWTH TO DATE CULTURE WILL BE HELD FOR 5 DAYS BEFORE ISSUING A FINAL NEGATIVE REPORT Performed at [facility]    Report Status PENDING  Incomplete  MRSA PCR Screening     Status: None   Collection Time: 04/07/14  7:28 AM  Result Value Ref Range Status   MRSA by PCR NEGATIVE NEGATIVE Final    Comment:  The GeneXpert MRSA Assay (FDA approved for NASAL specimens only), is one component of a comprehensive MRSA colonization surveillance program. It is not intended to diagnose MRSA infection nor to guide or monitor treatment for MRSA infections.      Labs: Basic Metabolic Panel:  Recent Labs Lab 04/06/14 2002 04/07/14 0838 04/07/14 1238 04/08/14 0759  NA 132* 135  --  137  K 4.2 4.3  --  4.1  CL 89* 95*  --  100  CO2 28 26  --  24  GLUCOSE 197* 102*  --  105*  BUN 27* 31*  --  45*  CREATININE 5.80* 6.36*  --  8.04*  CALCIUM 9.0 8.7  --  9.0  MG  --   --  2.2  --   PHOS  --   --   --  5.7*   Liver Function Tests:  Recent Labs Lab 04/07/14 0838 04/08/14 0759  AST 21  --   ALT 18  --   ALKPHOS 88  --   BILITOT 1.1  --   PROT 7.3  --   ALBUMIN 2.7* 2.4*   No results for input(s): LIPASE, AMYLASE in the last 168 hours. No results for input(s): AMMONIA in the last 168 hours. CBC:  Recent Labs Lab 04/06/14 2002 04/07/14 0838 04/08/14 0758  WBC 10.7* 10.1 7.4  NEUTROABS 8.9*  --   --   HGB 11.8* 11.3* 10.0*  HCT 37.5* 37.0* 32.8*  MCV 84.8 86.7 84.5  PLT 262 250 224   Cardiac  Enzymes:  Recent Labs Lab 04/06/14 2002 04/07/14 0320 04/07/14 0838 04/07/14 1238  TROPONINI 0.08* 0.09* 0.09* 0.06*   BNP: BNP (last 3 results)  Recent Labs  04/07/14 0320  BNP 522.2*    ProBNP (last 3 results) No results for input(s): PROBNP in the last 8760 hours.  CBG:  Recent Labs Lab 04/07/14 0751 04/07/14 1135 04/07/14 1631 04/07/14 2038 04/08/14 1225  GLUCAP 89 115* 141* 158* 91       Signed:  Eliasar Hlavaty L  [facility] 04/08/2014, 12:33 PM

## 2014-04-08 NOTE — Progress Notes (Signed)
Utilization review completed. Jensyn Cambria, RN, BSN. 

## 2014-04-08 NOTE — Progress Notes (Signed)
[facility] Progress Note  Assessment/Plan: 1. Acute generalized weakness/low grade temp - seems to have resolved - likely induced by staff trying to pull 6+ L during HD on Wednesday - he had skipped Monday - and cannot give me a good explanation of why this occurred - and doesn't seem to have the insight as how this attributed to large fluid gains; antibiotics d/c 2. ESRD - MWF - he has missed the last two Mondays at [facility] - having problems - with cannulation - arterial site recannulated - did not have an AF for March - no f'grams this year - appears issues may have been related to cannulation - watch 3. Anemia - Hgb 10 - down from 11-12 as outpt - last got Aranesp 60 on 2/24 - if it further declines resume ESA /check stool cards 4. Secondary hyperparathyroidism - not on Hectorol - continue binder P 5.75 5. HTN/volume - controlled CXR clear on admission - may need edw increased some 6. Nutrition - alb 2.4  7. Hx MGUS/? MM 03/2012  - seen by Dr. Alvy Bimler 03/2013 - she recommended repeat xray of the humerus in 3 months/repeat 24 hr urine to check disease progression - hasn't followed up that I can tell. 8. Elevated troponins - ? Unclear significance 9. Right BKA eschar - wound care consulted  [name], PA-C [place] [phone] 04/08/2014,8:51 AM  LOS: 2 days   Pt seen, examined and agree w A/P as above. Acute hypotension/ weakness likely due to large goal at HD.  Better.  HD today. OK for dc from renal stanpdoint.  [name] MD pager [phone]    cell [phone] 04/08/2014, 10:54 AM    Subjective:   Feeling better  Objective Filed Vitals:   04/08/14 0720 04/08/14 0725 04/08/14 0800 04/08/14 0830  BP: 128/72 121/56 121/55 131/63  Pulse: 70 55 76 68  Temp: 98.1 F (36.7 C)     TempSrc: Oral     Resp: 18 15 18 15   Weight: 66.2 kg (145 lb 15.1 oz)     SpO2: 100%      Physical Exam General: NAD baseline Heart: RRR 2/6 murmur Lungs: no  rales Abdomen: abdomen  Extremities:bilateral BKA - left stump very crusted - doesn't look overtly infected no LE edema -  Dialysis Access: left upper AVF  Dialysis Orders: Center: [facility] on MWF . EDW 63 kg HD Bath 2.0 k, 2.0 ca Time 4hr Heparin 2,000. Access LUA Avf  Hectorol 0 mcg IV/HD/ esa 0 Units IV/HD Venofer 0  Other op labs hgb 12.0 / ca 9.2 phos 3.6 pth 237  Additional Objective Labs: Basic Metabolic Panel:  Recent Labs Lab 04/06/14 2002 04/07/14 0838 04/08/14 0759  NA 132* 135 137  K 4.2 4.3 4.1  CL 89* 95* 100  CO2 28 26 24   GLUCOSE 197* 102* 105*  BUN 27* 31* 45*  CREATININE 5.80* 6.36* 8.04*  CALCIUM 9.0 8.7 9.0  PHOS  --   --  5.7*   Liver Function Tests:  Recent Labs Lab 04/07/14 0838 04/08/14 0759  AST 21  --   ALT 18  --   ALKPHOS 88  --   BILITOT 1.1  --   PROT 7.3  --   ALBUMIN 2.7* 2.4*   CBC:  Recent Labs Lab 04/06/14 2002 04/07/14 0838 04/08/14 0758  WBC 10.7* 10.1 7.4  NEUTROABS 8.9*  --   --   HGB 11.8* 11.3* 10.0*  HCT 37.5* 37.0* 32.8*  MCV 84.8 86.7 84.5  PLT 262 250 224   Blood Culture    Component Value Date/Time   SDES BLOOD RIGHT WRIST 04/07/2014 0340   SPECREQUEST BOTTLES DRAWN AEROBIC ONLY 3 CC 04/07/2014 0340   CULT  04/07/2014 0340           BLOOD CULTURE RECEIVED NO GROWTH TO DATE CULTURE WILL BE HELD FOR 5 DAYS BEFORE ISSUING A FINAL NEGATIVE REPORT Performed at [place] PENDING 04/07/2014 0340    Cardiac Enzymes:  Recent Labs Lab 04/06/14 2002 04/07/14 0320 04/07/14 0838 04/07/14 1238  TROPONINI 0.08* 0.09* 0.09* 0.06*   CBG:  Recent Labs Lab 04/07/14 0052 04/07/14 0751 04/07/14 1135 04/07/14 1631 04/07/14 2038  GLUCAP 115* 89 115* 141* 158*  Studies/Results: Dg Chest 2 View  04/06/2014   CLINICAL DATA:  Atrial fibrillation. Dialysis patient with illness since dialysis.  EXAM: CHEST  2 VIEW  COMPARISON:  05/05/2013.  05/01/2013.  FINDINGS: Chronic  elevation of the RIGHT hemidiaphragm with RIGHT basilar atelectasis. No airspace disease. No effusion. There are no findings to suggest volume overload. Suboptimal lateral view because the arms are not raised over head. Cardiopericardial silhouette appears similar to prior exams with tortuous atherosclerotic aorta. Monitoring leads project over the chest.  IMPRESSION: No active cardiopulmonary disease or interval change. Chronic elevation of the RIGHT hemidiaphragm.   Electronically Signed   By: Dereck Ligas M.D.   On: 04/06/2014 21:04   Medications:   . aspirin EC  81 mg Oral Daily  . atorvastatin  20 mg Oral q1800  . calcium acetate  1,334 mg Oral TID WC  . colchicine  0.6 mg Oral Once per day on Mon Thu  . feeding supplement (RESOURCE BREEZE)  1 Container Oral TID BM  . gabapentin  200 mg Oral QHS  . heparin  5,000 Units Subcutaneous 3 times per day  . insulin aspart  0-9 Units Subcutaneous TID WC  . lanthanum  1,000 mg Oral QAC supper  . levothyroxine  175 mcg Oral QAC breakfast  . megestrol  40 mg Oral Daily  . multivitamin  1 tablet Oral QHS  . sodium chloride  3 mL Intravenous Q12H

## 2014-04-08 NOTE — Evaluation (Signed)
Physical Therapy Evaluation Patient Details Name: Jack [name] MRN: [id number] DOB: April 11, 1945 Today's Date: 04/08/2014   History of Present Illness  Patient is a 69 yo male on HD secondary to ESRD, preior Lt TTA, recent Rt TTA (3 weeks ago) with current open wound residual limb. He had altered mental status after HD this week, was admitted and determined he has mild sepsis.  Clinical Impression  Patient reports he has left prosthesis, was using it and RW until 3 weeks ago when he had Rt lower leg amputation.  Patient with low level of mobility and Rt open leg wound.  Patient may benefit from use of mechanical lift for out of bed transfers for safety (he uses an overhead trapeze at home for bed mobility) and to keep bed in knee extension as he has left knee flexion contracture.  He anticipates Rt prosthesis by [employer] in future.  Patient may benefit from skilled PT to address decr functional mobility, decr transfers, decr strength and decr activity tolerance in order to safely discharge home. Will continue to monitor progress and determine discharge needs while on this venue of care.    Follow Up Recommendations Home health PT;Supervision/Assistance - 24 hour;SNF    Equipment Recommendations  None recommended by PT    Recommendations for Other Services       Precautions / Restrictions Precautions Precautions: Fall Precaution Comments: bil lower leg amputations; open Rt residual limb wound Restrictions Weight Bearing Restrictions: No Other Position/Activity Restrictions: advised patient to rest with knees extended to prevent contractures      Mobility  Bed Mobility Overal bed mobility: Needs Assistance Bed Mobility: Supine to Sit;Sit to Supine     Supine to sit: Max assist Sit to supine: Mod assist   General bed mobility comments: decr ability to scoot in bed, uses trapeze at home for bed mobility  Transfers Overall transfer level: Needs assistance Equipment  used: None Transfers: Comptroller transfers: +2 physical assistance   General transfer comment: patient with posterior lean, difficulty determining where to place his hands  Ambulation/Gait Ambulation/Gait assistance:  (unable)              Stairs            Wheelchair Mobility    Modified Rankin (Stroke Patients Only)       Balance Overall balance assessment: Needs assistance Sitting-balance support: Bilateral upper extremity supported Sitting balance-Leahy Scale: Poor Sitting balance - Comments: posterior lean, reliant on bil rails Postural control: Posterior lean Standing balance support:  (unable to stand)                                 Pertinent Vitals/Pain Pain Assessment: No/denies pain    Home Living Family/patient expects to be discharged to:: Private residence Living Arrangements: Children;Other relatives (lives with brother and dau) Available Help at Discharge: Family;Available 24 hours/day Type of Home: Apartment Home Access: Level entry     Home Layout: One level Home Equipment: Walker - 2 wheels;Cane - single point;Shower seat;Wheelchair - manual ([employer] prosthesis (by [employer])) Additional Comments: patient reports receiving HHPT/OT and aid prior to admission    Prior Function Level of Independence: Needs assistance   Gait / Transfers Assistance Needed: assist for transfers  ADL's / Homemaking Assistance Needed: reports that his aid does bed bath with total assist  Comments: was using RW until recent Rt  TTA 3 weeks ago     Hand Dominance   Dominant Hand: Right    Extremity/Trunk Assessment   Upper Extremity Assessment: Defer to OT evaluation           Lower Extremity Assessment: Generalized weakness (Lt knee flexion contacture)      Cervical / Trunk Assessment: Normal  Communication   Communication: No difficulties  Cognition Arousal/Alertness:  Awake/alert Behavior During Therapy: WFL for tasks assessed/performed Overall Cognitive Status: Within Functional Limits for tasks assessed                      General Comments General comments (skin integrity, edema, etc.): open Rt incision residual limb with moderate drainage    Exercises Amputee Exercises Quad Sets: AROM;Both;Supine;10 reps Gluteal Sets: AROM;Both;10 reps;Supine Towel Squeeze: AROM;Both;10 reps;Supine Hip Extension: AROM;Both;10 reps;Supine Knee Extension: AROM;Right;Left;10 reps;Supine Straight Leg Raises: AROM;Right;Left;10 reps;Supine      Assessment/Plan    PT Assessment Patient needs continued PT services  PT Diagnosis Generalized weakness   PT Problem List Decreased strength;Decreased range of motion;Decreased activity tolerance;Decreased balance;Decreased mobility;Decreased safety awareness  PT Treatment Interventions DME instruction;Functional mobility training;Therapeutic activities;Therapeutic exercise;Balance training;Neuromuscular re-education;Patient/family education;Wheelchair mobility training   PT Goals (Current goals can be found in the Care Plan section) Acute Rehab PT Goals Patient Stated Goal: be ready to use Rt prosthesis PT Goal Formulation: With patient Time For Goal Achievement: 04/23/14 Potential to Achieve Goals: Good    Frequency Min 3X/week   Barriers to discharge        Co-evaluation               End of Session   Activity Tolerance: Patient limited by fatigue;Patient limited by pain (pain in Rt leg with mobility) Patient left: in bed;with call bell/phone within reach (locked bed into knee ext for leg position management) Nurse Communication: Need for lift equipment;Mobility status         Time: 1312-1400 PT Time Calculation (min) (ACUTE ONLY): 48 min   Charges:   PT Evaluation $Initial PT Evaluation Tier I: 1 Procedure PT Treatments $Therapeutic Exercise: 8-22 mins $Therapeutic Activity: 8-22  mins   PT G CodesMalka So, [place] [phone] Tyrihanna Wingert 04/08/2014, 3:22 PM

## 2014-04-08 NOTE — Progress Notes (Signed)
  Echocardiogram 2D Echocardiogram has been performed.  Jack [name] 04/08/2014, 2:51 PM

## 2014-04-08 NOTE — Progress Notes (Signed)
Post HD tx pt no c/o, vss, 1.7 L removed. Report called to primary RN. Pt. Returned to 6E01. CCMD notified.

## 2014-04-08 NOTE — Progress Notes (Signed)
OT Cancellation Note  Patient Details Name: Jack [name] MRN: [id number] DOB: 10-07-1945   Cancelled Treatment:    Reason Eval/Treat Not Completed: Patient at procedure or test/ unavailable--hemodialysis.  Almon Register [phone] 04/08/2014, 7:46 AM

## 2014-04-13 ENCOUNTER — Telehealth: Payer: Self-pay | Admitting: Hematology and Oncology

## 2014-04-13 LAB — CULTURE, BLOOD (ROUTINE X 2)
Culture: NO GROWTH
Culture: NO GROWTH

## 2014-04-13 NOTE — Telephone Encounter (Signed)
returned [place] call from [facility] [phone] .Marland KitchenMarland Kitchenpt need appt....i lvm with pt appt for [facility]

## 2014-04-29 ENCOUNTER — Ambulatory Visit: Payer: Medicare Other | Admitting: Hematology and Oncology

## 2014-05-17 ENCOUNTER — Other Ambulatory Visit (HOSPITAL_COMMUNITY): Payer: Self-pay | Admitting: Orthopedic Surgery

## 2014-05-19 ENCOUNTER — Encounter (HOSPITAL_COMMUNITY): Payer: Self-pay | Admitting: *Deleted

## 2014-05-19 MED ORDER — CEFAZOLIN SODIUM-DEXTROSE 2-3 GM-% IV SOLR
2.0000 g | INTRAVENOUS | Status: AC
  Administered 2014-05-20: 2 g via INTRAVENOUS
  Filled 2014-05-19: qty 50

## 2014-05-19 NOTE — Progress Notes (Signed)
   05/19/14 1522  OBSTRUCTIVE SLEEP APNEA  Have you ever been diagnosed with sleep apnea through a sleep study? No  Do you snore loudly (loud enough to be heard through closed doors)?  1  Do you often feel tired, fatigued, or sleepy during the daytime? 1  Has anyone observed you stop breathing during your sleep? 0  Do you have, or are you being treated for high blood pressure? 0  BMI more than 35 kg/m2? 0  Age over 71 years old? 1  Gender: 1

## 2014-05-19 NOTE — Progress Notes (Signed)
Pt denies SOB, chest pain, and being under the care of a cardiologist. Pt denies having a stress test and cardiac cath. Pt made aware to stop taking otc vitamins, NSAID's and herbal medications. Pt verbalized understanding of all pre-op instructions.

## 2014-05-20 ENCOUNTER — Ambulatory Visit (HOSPITAL_COMMUNITY): Payer: Medicare Other | Admitting: Anesthesiology

## 2014-05-20 ENCOUNTER — Encounter (HOSPITAL_COMMUNITY)
Admission: RE | Disposition: A | Discharge status: Home / Self Care | Payer: Self-pay | Source: Ambulatory Visit | Attending: Orthopedic Surgery

## 2014-05-20 ENCOUNTER — Inpatient Hospital Stay (HOSPITAL_COMMUNITY)
Admission: RE | Admit: 2014-05-20 | Discharge: 2014-05-21 | DRG: 239 | Disposition: A | Discharge status: Home Health | Payer: Medicare Other | Source: Ambulatory Visit | Attending: Orthopedic Surgery | Admitting: Orthopedic Surgery

## 2014-05-20 ENCOUNTER — Encounter (HOSPITAL_COMMUNITY): Payer: Self-pay | Admitting: *Deleted

## 2014-05-20 DIAGNOSIS — E1152 Type 2 diabetes mellitus with diabetic peripheral angiopathy with gangrene: Secondary | ICD-10-CM | POA: Diagnosis present

## 2014-05-20 DIAGNOSIS — E039 Hypothyroidism, unspecified: Secondary | ICD-10-CM | POA: Diagnosis present

## 2014-05-20 DIAGNOSIS — Z91030 Bee allergy status: Secondary | ICD-10-CM

## 2014-05-20 DIAGNOSIS — E1140 Type 2 diabetes mellitus with diabetic neuropathy, unspecified: Secondary | ICD-10-CM | POA: Diagnosis present

## 2014-05-20 DIAGNOSIS — Z87891 Personal history of nicotine dependence: Secondary | ICD-10-CM | POA: Diagnosis not present

## 2014-05-20 DIAGNOSIS — D649 Anemia, unspecified: Secondary | ICD-10-CM | POA: Diagnosis present

## 2014-05-20 DIAGNOSIS — J45909 Unspecified asthma, uncomplicated: Secondary | ICD-10-CM | POA: Diagnosis present

## 2014-05-20 DIAGNOSIS — G4733 Obstructive sleep apnea (adult) (pediatric): Secondary | ICD-10-CM | POA: Diagnosis present

## 2014-05-20 DIAGNOSIS — Z992 Dependence on renal dialysis: Secondary | ICD-10-CM | POA: Diagnosis not present

## 2014-05-20 DIAGNOSIS — T879 Unspecified complications of amputation stump: Secondary | ICD-10-CM | POA: Diagnosis present

## 2014-05-20 DIAGNOSIS — Z89512 Acquired absence of left leg below knee: Secondary | ICD-10-CM

## 2014-05-20 DIAGNOSIS — M24562 Contracture, left knee: Secondary | ICD-10-CM | POA: Diagnosis present

## 2014-05-20 DIAGNOSIS — Z885 Allergy status to narcotic agent status: Secondary | ICD-10-CM | POA: Diagnosis not present

## 2014-05-20 DIAGNOSIS — Z7982 Long term (current) use of aspirin: Secondary | ICD-10-CM

## 2014-05-20 DIAGNOSIS — E079 Disorder of thyroid, unspecified: Secondary | ICD-10-CM | POA: Diagnosis present

## 2014-05-20 DIAGNOSIS — Z8614 Personal history of Methicillin resistant Staphylococcus aureus infection: Secondary | ICD-10-CM | POA: Diagnosis not present

## 2014-05-20 DIAGNOSIS — Z888 Allergy status to other drugs, medicaments and biological substances status: Secondary | ICD-10-CM

## 2014-05-20 DIAGNOSIS — Z91041 Radiographic dye allergy status: Secondary | ICD-10-CM | POA: Diagnosis not present

## 2014-05-20 DIAGNOSIS — Z8701 Personal history of pneumonia (recurrent): Secondary | ICD-10-CM

## 2014-05-20 DIAGNOSIS — E213 Hyperparathyroidism, unspecified: Secondary | ICD-10-CM | POA: Diagnosis present

## 2014-05-20 DIAGNOSIS — N186 End stage renal disease: Secondary | ICD-10-CM | POA: Diagnosis present

## 2014-05-20 DIAGNOSIS — H409 Unspecified glaucoma: Secondary | ICD-10-CM | POA: Diagnosis present

## 2014-05-20 DIAGNOSIS — I120 Hypertensive chronic kidney disease with stage 5 chronic kidney disease or end stage renal disease: Secondary | ICD-10-CM | POA: Diagnosis present

## 2014-05-20 HISTORY — PX: STUMP REVISION: SHX6102

## 2014-05-20 HISTORY — PX: REPAIR QUADRICEPS/HAMSTRING MUSCLES: SHX6563

## 2014-05-20 HISTORY — DX: Gangrene, not elsewhere classified: I96

## 2014-05-20 HISTORY — DX: Contracture, unspecified joint: M24.50

## 2014-05-20 LAB — COMPREHENSIVE METABOLIC PANEL
ALT: 12 U/L — AB (ref 17–63)
AST: 22 U/L (ref 15–41)
Albumin: 2.8 g/dL — ABNORMAL LOW (ref 3.5–5.0)
Alkaline Phosphatase: 64 U/L (ref 38–126)
Anion gap: 9 (ref 5–15)
BUN: 14 mg/dL (ref 6–20)
CHLORIDE: 98 mmol/L — AB (ref 101–111)
CO2: 31 mmol/L (ref 22–32)
Calcium: 8.7 mg/dL — ABNORMAL LOW (ref 8.9–10.3)
Creatinine, Ser: 3.8 mg/dL — ABNORMAL HIGH (ref 0.61–1.24)
GFR calc Af Amer: 17 mL/min — ABNORMAL LOW (ref >60)
GFR, EST NON AFRICAN AMERICAN: 15 mL/min — AB (ref >60)
Glucose, Bld: 104 mg/dL — ABNORMAL HIGH (ref 70–99)
POTASSIUM: 3.5 mmol/L (ref 3.5–5.1)
Sodium: 138 mmol/L (ref 135–145)
Total Bilirubin: 0.9 mg/dL (ref 0.3–1.2)
Total Protein: 6.6 g/dL (ref 6.5–8.1)

## 2014-05-20 LAB — GLUCOSE, CAPILLARY
GLUCOSE-CAPILLARY: 104 mg/dL — AB (ref 70–99)
GLUCOSE-CAPILLARY: 90 mg/dL (ref 70–99)
GLUCOSE-CAPILLARY: 177 mg/dL — AB (ref 70–99)
Glucose-Capillary: 76 mg/dL (ref 70–99)

## 2014-05-20 LAB — CBC
HCT: 35.1 % — ABNORMAL LOW (ref 39.0–52.0)
HEMOGLOBIN: 10.6 g/dL — AB (ref 13.0–17.0)
MCH: 26 pg (ref 26.0–34.0)
MCHC: 30.2 g/dL (ref 30.0–36.0)
MCV: 86.2 fL (ref 78.0–100.0)
Platelets: 230 10*3/uL (ref 150–400)
RBC: 4.07 MIL/uL — AB (ref 4.22–5.81)
RDW: 15.7 % — ABNORMAL HIGH (ref 11.5–15.5)
WBC: 8.8 10*3/uL (ref 4.0–10.5)

## 2014-05-20 LAB — PROTIME-INR
INR: 1.09 (ref 0.00–1.49)
Prothrombin Time: 14.2 seconds (ref 11.6–15.2)

## 2014-05-20 LAB — APTT: aPTT: 30 seconds (ref 24–37)

## 2014-05-20 SURGERY — REVISION, AMPUTATION SITE
Anesthesia: General | Laterality: Right

## 2014-05-20 MED ORDER — HYDROMORPHONE HCL 1 MG/ML IJ SOLN
INTRAMUSCULAR | Status: AC
  Administered 2014-05-20: 0.5 mg via INTRAVENOUS
  Filled 2014-05-20: qty 1

## 2014-05-20 MED ORDER — MIDAZOLAM HCL 2 MG/2ML IJ SOLN
INTRAMUSCULAR | Status: AC
  Filled 2014-05-20: qty 2

## 2014-05-20 MED ORDER — ONDANSETRON HCL 4 MG/2ML IJ SOLN
4.0000 mg | Freq: Four times a day (QID) | INTRAMUSCULAR | Status: DC | PRN
  Administered 2014-05-21: 4 mg via INTRAVENOUS
  Filled 2014-05-20: qty 2

## 2014-05-20 MED ORDER — METOCLOPRAMIDE HCL 5 MG PO TABS: 5.0000 mg | ORAL_TABLET | Freq: Three times a day (TID) | ORAL | Status: DC | PRN

## 2014-05-20 MED ORDER — RENA-VITE PO TABS
1.0000 | ORAL_TABLET | Freq: Every day | ORAL | Status: DC
  Administered 2014-05-20: 1 via ORAL
  Filled 2014-05-20 (×2): qty 1

## 2014-05-20 MED ORDER — METOCLOPRAMIDE HCL 5 MG/ML IJ SOLN: 5.0000 mg | Freq: Three times a day (TID) | INTRAMUSCULAR | Status: DC | PRN

## 2014-05-20 MED ORDER — METHOCARBAMOL 1000 MG/10ML IJ SOLN
500.0000 mg | Freq: Four times a day (QID) | INTRAVENOUS | Status: DC | PRN
  Filled 2014-05-20: qty 5

## 2014-05-20 MED ORDER — SODIUM CHLORIDE 0.9 % IV SOLN
Freq: Once | INTRAVENOUS | Status: AC
  Administered 2014-05-20: 11:00:00 via INTRAVENOUS

## 2014-05-20 MED ORDER — FENTANYL CITRATE (PF) 250 MCG/5ML IJ SOLN
INTRAMUSCULAR | Status: AC
  Filled 2014-05-20: qty 5

## 2014-05-20 MED ORDER — PROPOFOL 10 MG/ML IV BOLUS
INTRAVENOUS | Status: DC | PRN
  Administered 2014-05-20: 110 mg via INTRAVENOUS

## 2014-05-20 MED ORDER — HYDROMORPHONE HCL 1 MG/ML IJ SOLN
0.2500 mg | INTRAMUSCULAR | Status: DC | PRN
  Administered 2014-05-20 (×4): 0.5 mg via INTRAVENOUS

## 2014-05-20 MED ORDER — ONDANSETRON HCL 4 MG PO TABS: 4.0000 mg | ORAL_TABLET | Freq: Four times a day (QID) | ORAL | Status: DC | PRN

## 2014-05-20 MED ORDER — METHOCARBAMOL 500 MG PO TABS: 500.0000 mg | ORAL_TABLET | Freq: Four times a day (QID) | ORAL | Status: DC | PRN

## 2014-05-20 MED ORDER — BOOST / RESOURCE BREEZE PO LIQD
1.0000 | Freq: Three times a day (TID) | ORAL | Status: DC
  Administered 2014-05-20 – 2014-05-21 (×2): 1 via ORAL

## 2014-05-20 MED ORDER — DEXTROSE 50 % IV SOLN
INTRAVENOUS | Status: AC
  Filled 2014-05-20: qty 50

## 2014-05-20 MED ORDER — MEPERIDINE HCL 25 MG/ML IJ SOLN: 6.2500 mg | INTRAMUSCULAR | Status: DC | PRN

## 2014-05-20 MED ORDER — EPHEDRINE SULFATE 50 MG/ML IJ SOLN
INTRAMUSCULAR | Status: DC | PRN
  Administered 2014-05-20 (×2): 5 mg via INTRAVENOUS

## 2014-05-20 MED ORDER — ASPIRIN EC 81 MG PO TBEC
81.0000 mg | DELAYED_RELEASE_TABLET | Freq: Every day | ORAL | Status: DC
Start: 2014-05-20 — End: 2014-05-21
  Administered 2014-05-20 – 2014-05-21 (×2): 81 mg via ORAL
  Filled 2014-05-20 (×2): qty 1

## 2014-05-20 MED ORDER — LANTHANUM CARBONATE 500 MG PO CHEW
1000.0000 mg | CHEWABLE_TABLET | Freq: Every day | ORAL | Status: DC
  Administered 2014-05-20: 1000 mg via ORAL
  Filled 2014-05-20 (×2): qty 2

## 2014-05-20 MED ORDER — LIDOCAINE HCL (CARDIAC) 20 MG/ML IV SOLN
INTRAVENOUS | Status: AC
  Filled 2014-05-20: qty 5

## 2014-05-20 MED ORDER — PROMETHAZINE HCL 25 MG/ML IJ SOLN: 6.2500 mg | INTRAMUSCULAR | Status: DC | PRN

## 2014-05-20 MED ORDER — ACETAMINOPHEN 325 MG PO TABS: 650.0000 mg | ORAL_TABLET | Freq: Four times a day (QID) | ORAL | Status: DC | PRN

## 2014-05-20 MED ORDER — SODIUM CHLORIDE 0.9 % IV SOLN
INTRAVENOUS | Status: DC | PRN
  Administered 2014-05-20: 12:00:00 via INTRAVENOUS

## 2014-05-20 MED ORDER — SODIUM CHLORIDE 0.9 % IV SOLN
INTRAVENOUS | Status: DC
  Administered 2014-05-20: 10 mL/h via INTRAVENOUS

## 2014-05-20 MED ORDER — LEVOTHYROXINE SODIUM 175 MCG PO TABS
175.0000 ug | ORAL_TABLET | Freq: Every day | ORAL | Status: DC
  Administered 2014-05-21: 175 ug via ORAL
  Filled 2014-05-20 (×2): qty 1

## 2014-05-20 MED ORDER — OXYCODONE-ACETAMINOPHEN 5-325 MG PO TABS: 1.0000 | ORAL_TABLET | ORAL | Status: DC | PRN

## 2014-05-20 MED ORDER — FENTANYL CITRATE (PF) 100 MCG/2ML IJ SOLN
INTRAMUSCULAR | Status: DC | PRN
  Administered 2014-05-20: 50 ug via INTRAVENOUS

## 2014-05-20 MED ORDER — MEGESTROL ACETATE 40 MG PO TABS
40.0000 mg | ORAL_TABLET | Freq: Every day | ORAL | Status: DC
  Administered 2014-05-20 – 2014-05-21 (×2): 40 mg via ORAL
  Filled 2014-05-20 (×2): qty 1

## 2014-05-20 MED ORDER — ACETAMINOPHEN 650 MG RE SUPP: 650.0000 mg | Freq: Four times a day (QID) | RECTAL | Status: DC | PRN

## 2014-05-20 MED ORDER — MIDAZOLAM HCL 5 MG/5ML IJ SOLN
INTRAMUSCULAR | Status: DC | PRN
  Administered 2014-05-20: 1 mg via INTRAVENOUS

## 2014-05-20 MED ORDER — DEXTROSE 50 % IV SOLN
INTRAVENOUS | Status: DC | PRN
  Administered 2014-05-20: 12.5 g via INTRAVENOUS

## 2014-05-20 MED ORDER — LIDOCAINE HCL (CARDIAC) 20 MG/ML IV SOLN
INTRAVENOUS | Status: DC | PRN
  Administered 2014-05-20: 50 mg via INTRAVENOUS

## 2014-05-20 MED ORDER — HYDROMORPHONE HCL 1 MG/ML IJ SOLN
INTRAMUSCULAR | Status: AC
  Filled 2014-05-20: qty 1

## 2014-05-20 MED ORDER — HYDROMORPHONE HCL 1 MG/ML IJ SOLN
1.0000 mg | INTRAMUSCULAR | Status: DC | PRN
  Administered 2014-05-21 (×2): 1 mg via INTRAVENOUS
  Filled 2014-05-20 (×2): qty 1

## 2014-05-20 MED ORDER — OXYCODONE HCL 5 MG PO TABS
5.0000 mg | ORAL_TABLET | ORAL | Status: DC | PRN
  Administered 2014-05-20 – 2014-05-21 (×4): 10 mg via ORAL
  Filled 2014-05-20 (×4): qty 2

## 2014-05-20 MED ORDER — CALCIUM ACETATE 667 MG PO CAPS: 1334.0000 mg | ORAL_CAPSULE | Freq: Three times a day (TID) | ORAL | Status: DC

## 2014-05-20 MED ORDER — CEFAZOLIN SODIUM 1-5 GM-% IV SOLN
1.0000 g | Freq: Four times a day (QID) | INTRAVENOUS | Status: AC
Start: 2014-05-20 — End: 2014-05-21
  Administered 2014-05-20 – 2014-05-21 (×3): 1 g via INTRAVENOUS
  Filled 2014-05-20 (×3): qty 50

## 2014-05-20 MED ORDER — 0.9 % SODIUM CHLORIDE (POUR BTL) OPTIME
TOPICAL | Status: DC | PRN
  Administered 2014-05-20: 1000 mL

## 2014-05-20 SURGICAL SUPPLY — 55 items
BANDAGE ESMARK 6X9 LF (GAUZE/BANDAGES/DRESSINGS) IMPLANT
BLADE SAW RECIP 87.9 MT (BLADE) ×4 IMPLANT
BNDG COHESIVE 4X5 TAN STRL (GAUZE/BANDAGES/DRESSINGS) ×4 IMPLANT
BNDG COHESIVE 6X5 TAN STRL LF (GAUZE/BANDAGES/DRESSINGS) ×4 IMPLANT
BNDG ESMARK 6X9 LF (GAUZE/BANDAGES/DRESSINGS)
BNDG GAUZE ELAST 4 BULKY (GAUZE/BANDAGES/DRESSINGS) ×8 IMPLANT
BNDG GAUZE STRTCH 6 (GAUZE/BANDAGES/DRESSINGS) IMPLANT
COVER SURGICAL LIGHT HANDLE (MISCELLANEOUS) ×4 IMPLANT
CUFF TOURNIQUET SINGLE 34IN LL (TOURNIQUET CUFF) IMPLANT
DRAIN PENROSE 1/2X12 LTX STRL (WOUND CARE) IMPLANT
DRAPE EXTREMITY BILATERAL (DRAPE) ×4 IMPLANT
DRAPE EXTREMITY T 121X128X90 (DRAPE) IMPLANT
DRAPE PROXIMA HALF (DRAPES) ×4 IMPLANT
DRAPE U-SHAPE 47X51 STRL (DRAPES) ×8 IMPLANT
DRSG ADAPTIC 3X8 NADH LF (GAUZE/BANDAGES/DRESSINGS) ×4 IMPLANT
DRSG EMULSION OIL 3X3 NADH (GAUZE/BANDAGES/DRESSINGS) ×4 IMPLANT
DRSG PAD ABDOMINAL 8X10 ST (GAUZE/BANDAGES/DRESSINGS) ×8 IMPLANT
DURAPREP 26ML APPLICATOR (WOUND CARE) ×8 IMPLANT
ELECT CAUTERY BLADE 6.4 (BLADE) ×4 IMPLANT
ELECT REM PT RETURN 9FT ADLT (ELECTROSURGICAL) ×4
ELECTRODE REM PT RTRN 9FT ADLT (ELECTROSURGICAL) ×2 IMPLANT
EVACUATOR 1/8 PVC DRAIN (DRAIN) IMPLANT
GAUZE SPONGE 4X4 12PLY STRL (GAUZE/BANDAGES/DRESSINGS) IMPLANT
GLOVE BIOGEL PI IND STRL 6.5 (GLOVE) ×4 IMPLANT
GLOVE BIOGEL PI IND STRL 9 (GLOVE) ×2 IMPLANT
GLOVE BIOGEL PI INDICATOR 6.5 (GLOVE) ×4
GLOVE BIOGEL PI INDICATOR 9 (GLOVE) ×2
GLOVE ECLIPSE 6.5 STRL STRAW (GLOVE) ×4 IMPLANT
GLOVE SURG ORTHO 9.0 STRL STRW (GLOVE) ×4 IMPLANT
GOWN STRL REUS W/ TWL XL LVL3 (GOWN DISPOSABLE) ×4 IMPLANT
GOWN STRL REUS W/TWL XL LVL3 (GOWN DISPOSABLE) ×4
KIT BASIN OR (CUSTOM PROCEDURE TRAY) ×4 IMPLANT
KIT ROOM TURNOVER OR (KITS) ×4 IMPLANT
MANIFOLD NEPTUNE II (INSTRUMENTS) IMPLANT
NS IRRIG 1000ML POUR BTL (IV SOLUTION) ×4 IMPLANT
PACK GENERAL/GYN (CUSTOM PROCEDURE TRAY) ×4 IMPLANT
PAD ABD 8X10 STRL (GAUZE/BANDAGES/DRESSINGS) ×4 IMPLANT
PAD ARMBOARD 7.5X6 YLW CONV (MISCELLANEOUS) ×4 IMPLANT
PAD CAST 4YDX4 CTTN HI CHSV (CAST SUPPLIES) IMPLANT
PADDING CAST COTTON 4X4 STRL (CAST SUPPLIES)
PADDING CAST COTTON 6X4 STRL (CAST SUPPLIES) IMPLANT
SPONGE GAUZE 4X4 12PLY STER LF (GAUZE/BANDAGES/DRESSINGS) ×8 IMPLANT
SPONGE LAP 18X18 X RAY DECT (DISPOSABLE) IMPLANT
STAPLER VISISTAT 35W (STAPLE) IMPLANT
STOCKINETTE IMPERVIOUS LG (DRAPES) ×4 IMPLANT
SUT PDS AB 1 CT  36 (SUTURE)
SUT PDS AB 1 CT 36 (SUTURE) IMPLANT
SUT SILK 2 0 (SUTURE) ×2
SUT SILK 2-0 18XBRD TIE 12 (SUTURE) ×2 IMPLANT
SUT VIC AB 1 CTX 36 (SUTURE)
SUT VIC AB 1 CTX36XBRD ANBCTR (SUTURE) IMPLANT
TOWEL OR 17X24 6PK STRL BLUE (TOWEL DISPOSABLE) ×4 IMPLANT
TOWEL OR 17X26 10 PK STRL BLUE (TOWEL DISPOSABLE) ×4 IMPLANT
TUBE ANAEROBIC SPECIMEN COL (MISCELLANEOUS) IMPLANT
WATER STERILE IRR 1000ML POUR (IV SOLUTION) IMPLANT

## 2014-05-20 NOTE — Anesthesia Procedure Notes (Addendum)
Procedure Name: LMA Insertion Date/Time: 05/20/2014 1:22 PM Performed by: Izora Gala Pre-anesthesia Checklist: Patient identified, Emergency Drugs available, Suction available and Patient being monitored Patient Re-evaluated:Patient Re-evaluated prior to inductionOxygen Delivery Method: Circle system utilized Preoxygenation: Pre-oxygenation with 100% oxygen Intubation Type: IV induction Ventilation: Mask ventilation without difficulty LMA: LMA inserted LMA Size: 5.0 Number of attempts: 1 Placement Confirmation: positive ETCO2 Tube secured with: Tape Dental Injury: Teeth and Oropharynx as per pre-operative assessment     Anesthesia Regional Block:  Femoral nerve block  Pre-Anesthetic Checklist: ,, timeout performed, Correct Patient, Correct Site, Correct Laterality, Correct Procedure, Correct Position, site marked, Risks and benefits discussed, Surgical consent,  Pre-op evaluation,  Post-op pain management  Laterality: Right  Prep: chloraprep       Needles:  Injection technique: Single-shot  Needle Type: Stimulator Needle - 80     Needle Length: 9cm 9 cm Needle Gauge: 22 and 22 G  Needle insertion depth: 6 cm   Additional Needles: Femoral nerve block  Nerve Stimulator or Paresthesia:  Response: Patellar snap, 0.5 mA,   Additional Responses:   Narrative:  Injection made incrementally with aspirations every 5 mL.  Performed by: Personally  Anesthesiologist: Nolon Nations  Additional Notes: BP cuff, EKG monitors applied. Sedation begun. Femoral artery palpated for location of nerve. After nerve location verified with U/S, anesthetic injected incrementally, slowly, and after negative aspirations under direct u/s guidance. Good perineural spread. Patient tolerated well.   Anesthesia Regional Block:  Popliteal block  Pre-Anesthetic Checklist: ,, timeout performed, Correct Patient, Correct Site, Correct Laterality, Correct Procedure, Correct Position, site marked,  Risks and benefits discussed, Surgical consent,  Pre-op evaluation,  Post-op pain management  Laterality: Right  Prep: chloraprep       Needles:  Injection technique: Single-shot  Needle Type: Stimiplex     Needle Length: 10cm 10 cm Needle Gauge: 21 and 21 G    Additional Needles:  Motor weakness within 5 minutes. Popliteal block  Nerve Stimulator or Paresthesia:  Response: Plantar flexion/toe flexion, 0.5 mA,   Additional Responses:   Narrative:  Injection made incrementally with aspirations every 5 mL.  Performed by: Personally  Anesthesiologist: Nolon Nations  Additional Notes: Nerve located and needle positioned with direct ultrasound guidance. Good perineural spread. Patient tolerated well.

## 2014-05-20 NOTE — Transfer of Care (Signed)
Immediate Anesthesia Transfer of Care Note  Patient: Jack [name]  Procedure(s) Performed: Procedure(s): Revision Right Below Knee Amputation (Right) Left Hamstring Release (Left)  Patient Location: PACU  Anesthesia Type:General  Level of Consciousness: awake, alert  and oriented  Airway & Oxygen Therapy: Patient Spontanous Breathing and Patient connected to face mask oxygen  Post-op Assessment: Report given to RN, Post -op Vital signs reviewed and stable and Patient moving all extremities  Post vital signs: Reviewed and stable  Last Vitals:  Filed Vitals:   05/20/14 1016  BP: 129/57  Pulse: 95  Temp: 36.4 C  Resp: 16    Complications: No apparent anesthesia complications

## 2014-05-20 NOTE — Anesthesia Postprocedure Evaluation (Signed)
Anesthesia Post Note  Patient: Jack [name]  Procedure(s) Performed: Procedure(s) (LRB): Revision Right Below Knee Amputation (Right) Left Hamstring Release (Left)  Anesthesia type: General  Patient location: PACU  Post pain: Pain level controlled  Post assessment: Post-op Vital signs reviewed  Last Vitals: BP 126/63 mmHg  Pulse 62  Temp(Src) 36.4 C (Oral)  Resp 14  Ht 5\' 10"  (1.778 m)  Wt 150 lb (68.04 kg)  BMI 21.52 kg/m2  SpO2 100%  Post vital signs: Reviewed  Level of consciousness: sedated  Complications: No apparent anesthesia complications

## 2014-05-20 NOTE — Progress Notes (Signed)
Admission note:  Arrival Method: Patient arrived on the stretcher from PACU. Mental Orientation:  Alert and oriented x 4. Telemetry: N/A Assessment: See doc flow sheets. Skin: Assessed by two nurses Lexine Baton), warm, dry and intact.  No open areas or bruises noted.  Bilateral BKA wrapped with compression wrap. IV: Present, patent and infusing.  R forearm. Pain: Denies any at this moment. Safety Measures: Bed in low position, call bell within reach.   Fall Prevention Safety Plan: Safety plan explained, understood and acknowledged. Admission Screening: In progress. 6700 Orientation: Patient has been oriented to the unit, staff and to the room.

## 2014-05-20 NOTE — H&P (Signed)
Jack [name] is an 69 y.o. male.   Chief Complaint: Ulceration right transtibial amputation with hamstring contracture HPI: Patient is a 69 year old gentleman end-stage renal disease on dialysis with diabetic insensate neuropathy status post transtibial amputation who presents with ulceration and hamstring contracture.  Past Medical History  Diagnosis Date  . Hypertension   . Pneumonia     2012  . Heart murmur   . Glaucoma   . Multiple myeloma, without mention of having achieved remission 03/30/2012    Cytogenetic neg on 03/23/2012.  . Asthma   . Hyperparathyroidism, secondary renal   . Peripheral arterial disease   . End-stage renal disease on hemodialysis     Started HD March 2014.  Cause of ESRD was DM.  Gets HD at [facility] on [address] on MWF schedule.   . Thyroid disease     hyperparathyroidism  . MRSA bacteremia   . Anemia   . Peripheral vascular disease, unspecified 11/19/2012    In the past had R foot toe amps then R TMA. In 2015 had left foot toe amp > then TMA >then L BKA on 05/14/13   . History of MRSA infection 04/22/2013    Bacteremia assoc w L foot wound infection Mar 2015   . Gangrene of foot   . ESRD on hemodialysis   . Diabetes mellitus with peripheral vascular disease   . Hypothyroidism   . Decubitus ulcer of sacral region, stage 2 03/07/2014  . Glaucoma 03/07/2014  . Gangrene     right BKA  . Contracture of joint     left knee    Past Surgical History  Procedure Laterality Date  . Thyroidectomy    . Cervical disc surgery    . Insertion of dialysis catheter Right 03/19/2012    Procedure: INSERTION OF DIALYSIS CATHETER;  Surgeon: Mal Misty, MD;  Location: [place];  Service: Vascular;  Laterality: Right;  Right Internal Jugular  . Av fistula placement Left 03/25/2012    Procedure: ARTERIOVENOUS (AV) FISTULA CREATION;  Surgeon: Mal Misty, MD;  Location: [place];  Service: Vascular;  Laterality: Left;  . Ligation of competing branches of arteriovenous  fistula Left 05/08/2012    Procedure: LIGATION OF COMPETING BRANCHES OF ARTERIOVENOUS FISTULA;  Surgeon: Mal Misty, MD;  Location: [place];  Service: Vascular;  Laterality: Left;  Ultrasound guided  . Cardiac catheterization      approx 30 years ago  . Amputation Right 11/10/2012    Procedure: AMPUTATION FIRST and SECOND TOES Right Foot;  Surgeon: Elam Dutch, MD;  Location: [facility] OR;  Service: Vascular;  Laterality: Right;  . Transmetatarsal amputation Left 12/16/2012    Procedure: TRANSMETATARSAL AMPUTATION AND VAC PLACEMENT;  Surgeon: Elam Dutch, MD;  Location: [place];  Service: Vascular;  Laterality: Left;  . Amputation Left 04/07/2013    Procedure: AMPUTATION DIGIT- LEFT 1ST TOE;  Surgeon: Mal Misty, MD;  Location: [place];  Service: Vascular;  Laterality: Left;  . Tee without cardioversion N/A 04/20/2013    Procedure: TRANSESOPHAGEAL ECHOCARDIOGRAM (TEE);  Surgeon: Josue Hector, MD;  Location: [facility] ENDOSCOPY;  Service: Cardiovascular;  Laterality: N/A;  . I&d extremity Left 04/22/2013    Procedure: [id number] EXH DEBRIDEMENT LEFT FIRST TOE AMPUTATION WOUND ;  Surgeon: Mal Misty, MD;  Location: [place];  Service: Vascular;  Laterality: Left;  . Amputation Left 04/26/2013    Procedure: Left Foot Transmetatarsal Amputation;  Surgeon: Newt Minion, MD;  Location: [place];  Service: Orthopedics;  Laterality: Left;  . Toe amputation      D/C 04-30-13  . Below knee leg amputation Left 05/14/2013    DR [name]  . Amputation Left 05/14/2013    Procedure: AMPUTATION BELOW KNEE;  Surgeon: Newt Minion, MD;  Location: [place];  Service: Orthopedics;  Laterality: Left;  Left Below Knee Amputation  . Eye surgery Bilateral     CATARACTS  . Amputation Left 07/23/2013    Procedure: AMPUTATION BELOW KNEE;  Surgeon: Newt Minion, MD;  Location: [place];  Service: Orthopedics;  Laterality: Left;  Left Below Knee Amputation Revision  . Abdominal aortagram Bilateral 11/06/2012    Procedure: ABDOMINAL  AORTAGRAM;  Surgeon: Elam Dutch, MD;  Location: [facility] CATH LAB;  Service: Cardiovascular;  Laterality: Bilateral;  . I&d extremity Right 01/05/2014    Procedure: IRRIGATION AND DEBRIDEMENT Right Heel Ulcer;  Surgeon: Mcarthur Rossetti, MD;  Location: [place];  Service: Orthopedics;  Laterality: Right;  Surgeon Available after 5PM  . Below knee leg amputation Right 02/18/2014    dr duda  . Amputation Right 02/18/2014    Procedure: AMPUTATION BELOW KNEE;  Surgeon: Newt Minion, MD;  Location: [place];  Service: Orthopedics;  Laterality: Right;    Family History  Problem Relation Age of Onset  . Diabetes Mother   . Cancer Mother     bone   . Kidney disease Father    Social History:  reports that he quit smoking about 32 years ago. His smoking use included Cigarettes. He has never used smokeless tobacco. He reports that he does not drink alcohol or use illicit drugs.  Allergies:  Allergies  Allergen Reactions  . Bee Venom Anaphylaxis  . Lisinopril Cough  . Ivp Dye [Iodinated Diagnostic Agents] Hives  . Morphine And Related Hives and Other (See Comments)    Bradycardia states patient    No prescriptions prior to admission    No results found for this or any previous visit (from the past 48 hour(s)). No results found.  Review of Systems  All other systems reviewed and are negative.   There were no vitals taken for this visit. Physical Exam  On examination patient has ulceration to the transtibial amputation. He has a hamstring contracture and lacks 30 of extension to neutral. Assessment/Plan Assessment: Hamstring contracture with ulceration right transtibial amputation.  Plan: We'll plan for a hamstring release to straighten out his knee plan for revision of the amputation. Risk and benefits were discussed including infection neurovascular injury nonhealing the wound recurrence of the adhesions need for additional surgery. Patient states he understands and wishes to proceed  at this time.  [name],MARCUS V 05/20/2014, 6:29 AM

## 2014-05-20 NOTE — Progress Notes (Signed)
Patient ID: Jack [name], male   DOB: 20-May-1945, 69 y.o.   MRN: [id number] Patient status post right revision transtibial amputation and percutaneous release of the hamstrings on the left. Plan for overnight observation was discharged to home tomorrow Saturday. Prescription on chart for Percocet for discharge.

## 2014-05-20 NOTE — Anesthesia Preprocedure Evaluation (Addendum)
Anesthesia Evaluation  Patient identified by MRN, date of birth, ID band Patient awake    Reviewed: Allergy & Precautions, NPO status , Patient's Chart, lab work & pertinent test results  Airway Mallampati: II   Neck ROM: full    Dental   Pulmonary asthma , former smoker,  breath sounds clear to auscultation        Cardiovascular hypertension, + Peripheral Vascular Disease + dysrhythmias Rhythm:Regular Rate:Normal     Neuro/Psych  Neuromuscular disease    GI/Hepatic   Endo/Other  diabetes, Type 2Hypothyroidism   Renal/GU ESRF and DialysisRenal disease     Musculoskeletal   Abdominal   Peds  Hematology   Anesthesia Other Findings   Reproductive/Obstetrics                           Anesthesia Physical  Anesthesia Plan  ASA: III  Anesthesia Plan: General   Post-op Pain Management:    Induction: Intravenous  Airway Management Planned: LMA  Additional Equipment:   Intra-op Plan:   Post-operative Plan: Extubation in OR  Informed Consent: I have reviewed the patients History and Physical, chart, labs and discussed the procedure including the risks, benefits and alternatives for the proposed anesthesia with the patient or authorized representative who has indicated his/her understanding and acceptance.   Dental advisory given  Plan Discussed with: CRNA  Anesthesia Plan Comments:        Anesthesia Quick Evaluation                                  Anesthesia Evaluation  Patient identified by MRN, date of birth, ID band Patient awake    Reviewed: Allergy & Precautions, NPO status , Patient's Chart, lab work & pertinent test results  Airway Mallampati: II   Neck ROM: full    Dental   Pulmonary asthma , former smoker,          Cardiovascular hypertension, + Peripheral Vascular Disease + dysrhythmias     Neuro/Psych  Neuromuscular disease    GI/Hepatic    Endo/Other  diabetes, Type 2Hypothyroidism   Renal/GU ESRF and DialysisRenal disease     Musculoskeletal   Abdominal   Peds  Hematology   Anesthesia Other Findings   Reproductive/Obstetrics                             Anesthesia Physical Anesthesia Plan  ASA: III  Anesthesia Plan: General   Post-op Pain Management:    Induction: Intravenous  Airway Management Planned: LMA  Additional Equipment:   Intra-op Plan:   Post-operative Plan:   Informed Consent: I have reviewed the patients History and Physical, chart, labs and discussed the procedure including the risks, benefits and alternatives for the proposed anesthesia with the patient or authorized representative who has indicated his/her understanding and acceptance.     Plan Discussed with: CRNA, Anesthesiologist and Surgeon  Anesthesia Plan Comments:         Anesthesia Quick Evaluation

## 2014-05-20 NOTE — Op Note (Signed)
05/20/2014  6:12 PM  PATIENT:  Jack [name]    PRE-OPERATIVE DIAGNOSIS:  Gangrene Right Below Knee Amputation, Contracture Left Knee  POST-OPERATIVE DIAGNOSIS:  Same  PROCEDURE:  Revision Right Below Knee Amputation, Left Hamstring Release  SURGEON:  Newt Minion, MD  PHYSICIAN ASSISTANT:None ANESTHESIA:   General  PREOPERATIVE INDICATIONS:  Jack [name] is a  69 y.o. male with a diagnosis of Gangrene Right Below Knee Amputation, Contracture Left Knee who failed conservative measures and elected for surgical management.    The risks benefits and alternatives were discussed with the patient preoperatively including but not limited to the risks of infection, bleeding, nerve injury, cardiopulmonary complications, the need for revision surgery, among others, and the patient was willing to proceed.  OPERATIVE IMPLANTS: None  OPERATIVE FINDINGS: Viable muscle and soft tissue at the level of amputation.  OPERATIVE PROCEDURE: Patient was brought to the operating room and underwent a general anesthetic. After adequate levels of anesthesia were obtained bilateral lower extremities were prepped using DuraPrep draped into a sterile field. A timeout was called. Attention was first focused on revision of the right transtibial amputation. A fishmouth incision was made around the necrotic tissue. This was carried down to bone. The distal 3 cm of bone and fibula were resected in 1 block of tissue. Hemostasis was obtained the wound was irrigated with normal saline. The wound was closed using 2-0 nylon. Attention was then focused on the hamstrings. 2 percutaneous incisions were made medially and laterally over the hamstring tendons. These were released this brought the knee from 90 of flexion out to about 20 of flexion. The wounds were closed using 2-0 nylon. Sterile compressive dressings were applied for both lower extremities patient was extubated taken to the PACU in stable condition plan for  overnight observation.

## 2014-05-21 LAB — MRSA PCR SCREENING: MRSA by PCR: NEGATIVE

## 2014-05-21 LAB — GLUCOSE, CAPILLARY: GLUCOSE-CAPILLARY: 157 mg/dL — AB (ref 70–99)

## 2014-05-21 NOTE — Progress Notes (Signed)
Patient is stable in bed.  Dressing c/d/i.  Pain controlled.  Dc home today.  Azucena Cecil, MD [place] 9:01 AM

## 2014-05-21 NOTE — Progress Notes (Signed)
Jack [name] to be D/C'd Home per MD order.  Discussed prescriptions and follow up appointments with the patient. Prescriptions given to patient, medication list explained in detail. Pt verbalized understanding. Case Manager set up pt with [place].    Medication List    TAKE these medications        aspirin EC 81 MG tablet  Take 81 mg by mouth daily.     calcium acetate 667 MG capsule  Commonly known as:  PHOSLO  Take 1,334 mg by mouth 3 (three) times daily with meals.     collagenase ointment  Commonly known as:  SANTYL  Apply 1 application topically daily.     feeding supplement (RESOURCE BREEZE) Liqd  Take 1 Container by mouth 3 (three) times daily between meals.     lanthanum 1000 MG chewable tablet  Commonly known as:  FOSRENOL  Chew 1,000 mg by mouth daily.     levothyroxine 175 MCG tablet  Commonly known as:  SYNTHROID, LEVOTHROID  Take 175 mcg by mouth daily before breakfast.     megestrol 40 MG tablet  Commonly known as:  MEGACE  Take 40 mg by mouth daily.     multivitamin Tabs tablet  Take 1 tablet by mouth at bedtime.     oxyCODONE-acetaminophen 5-325 MG per tablet  Commonly known as:  ROXICET  Take 1 tablet by mouth every 4 (four) hours as needed for severe pain.     oxyCODONE-acetaminophen 5-325 MG per tablet  Commonly known as:  ROXICET  Take 1 tablet by mouth every 4 (four) hours as needed for severe pain.     promethazine 12.5 MG tablet  Commonly known as:  PHENERGAN  Take 1 tablet (12.5 mg total) by mouth every 6 (six) hours as needed for nausea or vomiting.        Filed Vitals:   05/21/14 0832  BP: 115/66  Pulse: 73  Temp: 98.9 F (37.2 C)  Resp: 18    Skin clean, dry and intact without evidence of skin break down, no evidence of skin tears noted. IV catheter discontinued intact. Site without signs and symptoms of complications. Dressing and pressure applied. Pt denies pain at this time. No complaints noted.  An After  Visit Summary was printed and given to the patient. Patient escorted via [place], and D/C home via private auto.  [name] A 05/21/2014 11:59 AM

## 2014-05-21 NOTE — Progress Notes (Signed)
PT note Pt was being d/c'd when PT arrived.  Talked with pt and daughter who have all needed equipment. Daughter had helped pt into chair earlier and states his transfers are similar to PLOF.  They do agree that HHPT would be beneficial.  Nurse and nurse secretary to make sure HHPT ordered for home.  Eval deferred in hospital.  Thanks. [place] [phone] (pager)

## 2014-05-21 NOTE — Care Management Note (Signed)
Case Management Note  Patient Details  Name: Jack [name] MRN: [id number] Date of Birth: 1945-11-13  Subjective/Objective:                   right revision transtibial amputation and percutaneous release of the hamstrings on the left Action/Plan:  Discharge planning Expected Discharge Date:                  Expected Discharge Plan:  [place]  In-House Referral:     Discharge planning Services  CM Consult  Post Acute Care Choice:    Choice offered to:  Adult Children  DME Arranged:  Hospital bed DME Agency:  [place] Arranged:  PT, OT [facility] Agency:  [place]  Status of Service:  Completed, signed off  Medicare Important Message Given:    Date Medicare IM Given:    Medicare IM give by:    Date Additional Medicare IM Given:    Additional Medicare Important Message give by:     If discussed at [place] of Stay Meetings, dates discussed:    Additional Comments: CM received call from RN requesting arrangement for hospital bed and HHPT/OT.  Cm called MD and requested orders for bed, HHPT/OT and face to face.  Cm spoke with daughter of pt as family needed to have discharge quickly and [name] [phone] requested [facility] to please deliver the bed on Monday 05/23/14.  CM called [facility] DME rep, Germaine with bed request and [facility] rep, Tiffany with referral for HHPT/OT.  No other CM needs were communicated.   Dellie Catholic, RN 05/21/2014, 1:09 PM

## 2014-05-23 ENCOUNTER — Encounter (HOSPITAL_COMMUNITY): Payer: Self-pay | Admitting: Orthopedic Surgery

## 2014-05-23 LAB — GLUCOSE, CAPILLARY
GLUCOSE-CAPILLARY: 70 mg/dL (ref 70–99)
Glucose-Capillary: 97 mg/dL (ref 70–99)

## 2014-05-23 NOTE — Discharge Summary (Signed)
Physician Discharge Summary  Patient ID: Jack [name] MRN: [id number] DOB/AGE: 05-17-1945 69 y.o.  Admit date: 05/20/2014 Discharge date: 05/23/2014  Admission Diagnoses: Gangrene right lower extremity with hamstring contracture on the left  Discharge Diagnoses: Same Active Problems:   Complications, amputation stump late   Discharged Condition: stable  Hospital Course: Patient's hospital course was essentially unremarkable. He underwent transtibial amputation on the right with hamstring release on the left. Postoperatively patient progressed well underwent inpatient dialysis and was discharged to home in stable condition.  Consults: None  Significant Diagnostic Studies: labs: Routine labs  Treatments: surgery: See operative note  Discharge Exam: Blood pressure 115/66, pulse 73, temperature 98.9 F (37.2 C), temperature source Oral, resp. rate 18, height 5\' 10"  (1.778 m), weight 67.9 kg (149 lb 11.1 oz), SpO2 100 %. Incision/Wound: dressing clean and dry  Disposition: 01-Home or Self Care  Discharge Instructions    Call MD / Call 911    Complete by:  As directed   If you experience chest pain or shortness of breath, CALL 911 and be transported to the hospital emergency room.  If you develope a fever above 101 F, pus (white drainage) or increased drainage or redness at the wound, or calf pain, call your surgeon's office.     Call MD / Call 911    Complete by:  As directed   If you experience chest pain or shortness of breath, CALL 911 and be transported to the hospital emergency room.  If you develope a fever above 101.5 F, pus (white drainage) or increased drainage or redness at the wound, or calf pain, call your surgeon's office.     Constipation Prevention    Complete by:  As directed   Drink plenty of fluids.  Prune juice may be helpful.  You may use a stool softener, such as Colace (over the counter) 100 mg twice a day.  Use MiraLax (over the counter) for constipation as  needed.     Constipation Prevention    Complete by:  As directed   Drink plenty of fluids.  Prune juice may be helpful.  You may use a stool softener, such as Colace (over the counter) 100 mg twice a day.  Use MiraLax (over the counter) for constipation as needed.     Diet - low sodium heart healthy    Complete by:  As directed      Diet - low sodium heart healthy    Complete by:  As directed      Diet general    Complete by:  As directed      Driving restrictions    Complete by:  As directed   No driving while taking narcotic pain meds.     Increase activity slowly as tolerated    Complete by:  As directed      Increase activity slowly as tolerated    Complete by:  As directed             Medication List    TAKE these medications        aspirin EC 81 MG tablet  Take 81 mg by mouth daily.     calcium acetate 667 MG capsule  Commonly known as:  PHOSLO  Take 1,334 mg by mouth 3 (three) times daily with meals.     collagenase ointment  Commonly known as:  SANTYL  Apply 1 application topically daily.     feeding supplement (RESOURCE BREEZE) Liqd  Take 1 Container by  mouth 3 (three) times daily between meals.     lanthanum 1000 MG chewable tablet  Commonly known as:  FOSRENOL  Chew 1,000 mg by mouth daily.     levothyroxine 175 MCG tablet  Commonly known as:  SYNTHROID, LEVOTHROID  Take 175 mcg by mouth daily before breakfast.     megestrol 40 MG tablet  Commonly known as:  MEGACE  Take 40 mg by mouth daily.     multivitamin Tabs tablet  Take 1 tablet by mouth at bedtime.     oxyCODONE-acetaminophen 5-325 MG per tablet  Commonly known as:  ROXICET  Take 1 tablet by mouth every 4 (four) hours as needed for severe pain.     oxyCODONE-acetaminophen 5-325 MG per tablet  Commonly known as:  ROXICET  Take 1 tablet by mouth every 4 (four) hours as needed for severe pain.     promethazine 12.5 MG tablet  Commonly known as:  PHENERGAN  Take 1 tablet (12.5 mg total)  by mouth every 6 (six) hours as needed for nausea or vomiting.           Follow-up Information    Follow up with Gaelyn Tukes V, MD In 1 week.   Specialty:  Orthopedic Surgery   Why:  For wound re-check   Contact information:   [place] [address] [phone]       Follow up with [facility].   Why:  home health physical and occupational therapy and hospital bed   Contact information:   [address] [place] [address] [phone]       Signed: Newt [name] 05/23/2014, 6:08 AM

## 2014-05-23 NOTE — Anesthesia Postprocedure Evaluation (Signed)
Anesthesia Post Note  Patient: Jack [name]  Procedure(s) Performed: Procedure(s) (LRB): Revision Right Below Knee Amputation (Right) Left Hamstring Release (Left)  Anesthesia type: General  Patient location: PACU  Post pain: Pain level controlled  Post assessment: Post-op Vital signs reviewed  Last Vitals: BP 115/66 mmHg  Pulse 73  Temp(Src) 37.2 C (Oral)  Resp 18  Ht 5\' 10"  (1.778 m)  Wt 149 lb 11.1 oz (67.9 kg)  BMI 21.48 kg/m2  SpO2 100%  Post vital signs: Reviewed  Level of consciousness: sedated  Complications: No apparent anesthesia complications

## 2014-08-18 ENCOUNTER — Ambulatory Visit: Payer: Medicare Other | Attending: Orthopedic Surgery | Admitting: Physical Therapy

## 2014-08-18 ENCOUNTER — Encounter: Payer: Self-pay | Admitting: Physical Therapy

## 2014-08-18 DIAGNOSIS — R269 Unspecified abnormalities of gait and mobility: Secondary | ICD-10-CM | POA: Insufficient documentation

## 2014-08-18 DIAGNOSIS — R2689 Other abnormalities of gait and mobility: Secondary | ICD-10-CM

## 2014-08-18 DIAGNOSIS — M24669 Ankylosis, unspecified knee: Secondary | ICD-10-CM | POA: Diagnosis present

## 2014-08-18 DIAGNOSIS — Z89512 Acquired absence of left leg below knee: Secondary | ICD-10-CM | POA: Diagnosis present

## 2014-08-18 DIAGNOSIS — M24659 Ankylosis, unspecified hip: Secondary | ICD-10-CM | POA: Insufficient documentation

## 2014-08-18 DIAGNOSIS — R2681 Unsteadiness on feet: Secondary | ICD-10-CM | POA: Insufficient documentation

## 2014-08-18 DIAGNOSIS — Z5189 Encounter for other specified aftercare: Secondary | ICD-10-CM | POA: Diagnosis present

## 2014-08-18 DIAGNOSIS — M25659 Stiffness of unspecified hip, not elsewhere classified: Secondary | ICD-10-CM

## 2014-08-18 DIAGNOSIS — R29898 Other symptoms and signs involving the musculoskeletal system: Secondary | ICD-10-CM | POA: Insufficient documentation

## 2014-08-18 DIAGNOSIS — R6889 Other general symptoms and signs: Secondary | ICD-10-CM | POA: Insufficient documentation

## 2014-08-18 DIAGNOSIS — R29818 Other symptoms and signs involving the nervous system: Secondary | ICD-10-CM | POA: Insufficient documentation

## 2014-08-18 DIAGNOSIS — Z89511 Acquired absence of right leg below knee: Secondary | ICD-10-CM | POA: Diagnosis present

## 2014-08-19 NOTE — Therapy (Signed)
[facility] [address] [place], [place], [address] Phone: [phone]   Fax:  [phone]  Physical Therapy Evaluation  Patient Details  Name: Jack [name] MRN: [id number] Date of Birth: 08-03-1945 Referring Provider:  Newt Minion, MD  Encounter Date: 08/18/2014      PT End of Session - 08/18/14 1445    Visit Number 1   Number of Visits 35   Date for PT Re-Evaluation 10/17/14   Authorization Type G-Code every 10 visits   PT Start Time 9977   PT Stop Time 1530   PT Time Calculation (min) 45 min   Equipment Utilized During Treatment Gait belt   Activity Tolerance Patient limited by fatigue   Behavior During Therapy [facility] for tasks assessed/performed      Past Medical History  Diagnosis Date  . Hypertension   . Pneumonia     2012  . Heart murmur   . Glaucoma   . Multiple myeloma, without mention of having achieved remission 03/30/2012    Cytogenetic neg on 03/23/2012.  . Asthma   . Hyperparathyroidism, secondary renal   . Peripheral arterial disease   . End-stage renal disease on hemodialysis     Started HD March 2014.  Cause of ESRD was DM.  Gets HD at [facility] on [address] on MWF schedule.   . Thyroid disease     hyperparathyroidism  . MRSA bacteremia   . Anemia   . Peripheral vascular disease, unspecified 11/19/2012    In the past had R foot toe amps then R TMA. In 2015 had left foot toe amp > then TMA >then L BKA on 05/14/13   . History of MRSA infection 04/22/2013    Bacteremia assoc w L foot wound infection Mar 2015   . Gangrene of foot   . ESRD on hemodialysis   . Diabetes mellitus with peripheral vascular disease   . Hypothyroidism   . Decubitus ulcer of sacral region, stage 2 03/07/2014  . Glaucoma 03/07/2014  . Gangrene     right BKA  . Contracture of joint     left knee    Past Surgical History  Procedure Laterality Date  . Thyroidectomy    . Cervical disc surgery    . Insertion of  dialysis catheter Right 03/19/2012    Procedure: INSERTION OF DIALYSIS CATHETER;  Surgeon: Mal Misty, MD;  Location: [place];  Service: Vascular;  Laterality: Right;  Right Internal Jugular  . Av fistula placement Left 03/25/2012    Procedure: ARTERIOVENOUS (AV) FISTULA CREATION;  Surgeon: Mal Misty, MD;  Location: [place];  Service: Vascular;  Laterality: Left;  . Ligation of competing branches of arteriovenous fistula Left 05/08/2012    Procedure: LIGATION OF COMPETING BRANCHES OF ARTERIOVENOUS FISTULA;  Surgeon: Mal Misty, MD;  Location: [place];  Service: Vascular;  Laterality: Left;  Ultrasound guided  . Cardiac catheterization      approx 30 years ago  . Amputation Right 11/10/2012    Procedure: AMPUTATION FIRST and SECOND TOES Right Foot;  Surgeon: Elam Dutch, MD;  Location: [facility] OR;  Service: Vascular;  Laterality: Right;  . Transmetatarsal amputation Left 12/16/2012    Procedure: TRANSMETATARSAL AMPUTATION AND VAC PLACEMENT;  Surgeon: Elam Dutch, MD;  Location: [place];  Service: Vascular;  Laterality: Left;  . Amputation Left 04/07/2013    Procedure: AMPUTATION DIGIT- LEFT 1ST TOE;  Surgeon: Mal Misty, MD;  Location: [place];  Service:  Vascular;  Laterality: Left;  . Tee without cardioversion N/A 04/20/2013    Procedure: TRANSESOPHAGEAL ECHOCARDIOGRAM (TEE);  Surgeon: Josue Hector, MD;  Location: [facility] ENDOSCOPY;  Service: Cardiovascular;  Laterality: N/A;  . I&d extremity Left 04/22/2013    Procedure: [id number] LSL DEBRIDEMENT LEFT FIRST TOE AMPUTATION WOUND ;  Surgeon: Mal Misty, MD;  Location: [place];  Service: Vascular;  Laterality: Left;  . Amputation Left 04/26/2013    Procedure: Left Foot Transmetatarsal Amputation;  Surgeon: Newt Minion, MD;  Location: [place];  Service: Orthopedics;  Laterality: Left;  . Toe amputation      D/C 04-30-13  . Below knee leg amputation Left 05/14/2013    DR [name]  . Amputation Left 05/14/2013    Procedure: AMPUTATION BELOW KNEE;   Surgeon: Newt Minion, MD;  Location: [place];  Service: Orthopedics;  Laterality: Left;  Left Below Knee Amputation  . Eye surgery Bilateral     CATARACTS  . Amputation Left 07/23/2013    Procedure: AMPUTATION BELOW KNEE;  Surgeon: Newt Minion, MD;  Location: [place];  Service: Orthopedics;  Laterality: Left;  Left Below Knee Amputation Revision  . Abdominal aortagram Bilateral 11/06/2012    Procedure: ABDOMINAL AORTAGRAM;  Surgeon: Elam Dutch, MD;  Location: [facility] CATH LAB;  Service: Cardiovascular;  Laterality: Bilateral;  . I&d extremity Right 01/05/2014    Procedure: IRRIGATION AND DEBRIDEMENT Right Heel Ulcer;  Surgeon: Mcarthur Rossetti, MD;  Location: [place];  Service: Orthopedics;  Laterality: Right;  Surgeon Available after 5PM  . Below knee leg amputation Right 02/18/2014    dr duda  . Amputation Right 02/18/2014    Procedure: AMPUTATION BELOW KNEE;  Surgeon: Newt Minion, MD;  Location: [place];  Service: Orthopedics;  Laterality: Right;  . Stump revision Right 05/20/2014    Procedure: Revision Right Below Knee Amputation;  Surgeon: Newt Minion, MD;  Location: [place];  Service: Orthopedics;  Laterality: Right;  . Repair quadriceps/hamstring muscles Left 05/20/2014    Procedure: Left Hamstring Release;  Surgeon: Newt Minion, MD;  Location: [place];  Service: Orthopedics;  Laterality: Left;    There were no vitals filed for this visit.  Visit Diagnosis:  Abnormality of gait  Decreased functional activity tolerance  Unsteadiness  Balance problems  Weakness of both legs  Decreased range of hip movement, unspecified laterality  Decreased range of knee movement, unspecified laterality  Status post bilateral below knee amputation      Subjective Assessment - 08/18/14 1456    Subjective This 69yo male has vascular problems with wounds with partial foot amputations from 11/10/2012. He underwent a left Transtibial Amputation 05/14/2013 that was revised 07/23/2013 and a right  Transtibial Amputation on 02/18/2014 with revision 05/20/2014. He recieved bilateral prostheses on 08/11/2014. He presents for PT evaluation.                                    Patient Stated Goals He wants to use prostheses to walk in home & community.   Currently in Pain? No/denies            [facility] PT Assessment - 08/18/14 1445    Assessment   Medical Diagnosis Bilateral Transtibial Amputations   Onset Date/Surgical Date 08/11/14   Precautions   Precautions Fall  No BP left UE   Precaution Comments NO BP Left UE   Restrictions   Weight  Bearing Restrictions No   Balance Screen   Has the patient fallen in the past 6 months No   Has the patient had a decrease in activity level because of a fear of falling?  No   Is the patient reluctant to leave their home because of a fear of falling?  No   Home Ecologist residence   Living Arrangements Children;Other relatives   Type of Home Apartment   Home Access Level entry   Home Layout One level   [place] - single point;Walker - 2 wheels;Crutches;Bedside commode;Shower seat;Wheelchair - manual   Prior Function   Level of Independence Independent;Independent with household mobility with device;Independent with gait;Independent with transfers   Observation/Other Assessments   Focus on Therapeutic Outcomes (FOTO)  16 Functional Status   Fear Avoidance Belief Questionnaire (FABQ)  60 (16)   Posture/Postural Control   Posture/Postural Control Postural limitations   Postural Limitations Rounded Shoulders;Forward head;Increased lumbar lordosis;Flexed trunk  knees & hips flexion   ROM / Strength   AROM / PROM / Strength PROM;Strength   PROM   Overall PROM  Deficits   Overall PROM Comments UEs WFL   PROM Assessment Site Hip;Knee   Right/Left Hip --   Left Hip Extension -30   standing with assist   Left Hip ABduction +10   sitting   Right/Left Knee Right;Left   Strength   Overall Strength Deficits    Overall Strength Comments UE gross MMT Right 4/5, Left 3-/5 shoulder, 4/5 elbow/ grip   Strength Assessment Site Hip;Knee   Right/Left Hip Left;Right   Right Hip Flexion 3-/5   Right Hip Extension 2/5   Right Hip ABduction 2/5   Left Hip Flexion 3-/5   Left Hip Extension 2/5   Left Hip ABduction 2/5   Right/Left Knee Right;Left   Right Knee Flexion 2+/5   Right Knee Extension 3-/5   Left Knee Flexion 2+/5   Left Knee Extension 3-/5   Right Hip   Right Hip Extension -30  standing   Right Hip ABduction 10  sitting   Transfers   Transfers Sit to Stand;Stand to Sit;Lateral/Scoot Transfers   Sit to Stand 1: +2 Total assist;From chair/3-in-1;With upper extremity assist;With armrests  pulling on parallel bars   Sit to Stand Details Verbal cues for precautions/safety;Verbal cues for technique;Verbal cues for sequencing;Visual cues for safe use of DME/AE;Visual cues/gestures for precautions/safety;Visual cues/gestures for sequencing   Sit to Stand: Patient Percentage 10%   Stand to Sit 1: +1 Total assist;With upper extremity assist;With armrests;To chair/3-in-1  using parallel bars to partially control descent   Stand to Sit Details (indicate cue type and reason) Visual cues for safe use of DME/AE;Visual cues/gestures for precautions/safety;Visual cues/gestures for sequencing;Verbal cues for technique;Verbal cues for precautions/safety;Verbal cues for safe use of DME/AE   Lateral/Scoot Transfers 4: Min assist;With armrests removed  level surfaces in contact with each other   Lateral/Scoot Transfer Details (indicate cue type and reason) minimal to no clearance of buttocks, to right & left   Ambulation/Gait   Ambulation/Gait Yes   Ambulation/Gait Assistance 1: +2 Total assist   Ambulation/Gait Assistance Details PT blocking knees from buckline in stance & manually maintaining upright at pelvis   Ambulation Distance (Feet) 4 Feet   Assistive device Parallel bars;Prostheses   Gait Pattern  Step-to pattern;Decreased step length - right;Decreased step length - left;Decreased stride length;Decreased hip/knee flexion - right;Decreased hip/knee flexion - left;Right flexed knee in stance;Left flexed knee in  stance;Lateral hip instability;Trunk flexed;Poor foot clearance - left;Poor foot clearance - right  step length bilaterally 3-4" progression of heel contact   Ambulation Surface Indoor;Level   Balance   Balance Assessed Yes   Static Sitting Balance   Static Sitting - Balance Support No upper extremity supported;Feet supported   Static Sitting - Level of Assistance 5: Stand by assistance   Static Sitting - Comment/# of Minutes 2 min   Dynamic Sitting Balance   Dynamic Sitting - Balance Support Left upper extremity supported;Feet supported;During functional activity   Dynamic Sitting - Level of Assistance 5: Stand by assistance   Reach (Patient is able to reach ___ inches to right, left, forward, back) reaches 5" anteriorly & right, across midline 2" beyond AROM   Dynamic Sitting - Balance Activities Reaching for objects   Static Standing Balance   Static Standing - Balance Support Bilateral upper extremity supported  in parallel bars   Static Standing - Level of Assistance 1: +1 Total assist   Static Standing - Comment/# of Minutes maintains upright with Total assist at pelvis with knees blocked for 1 minute   Dynamic Standing Balance   Dynamic Standing - Balance Support Bilateral upper extremity supported;During functional activity  In parallel bars   Dynamic Standing - Level of Assistance 1: +1 Total assist   Dynamic Standing - Balance Activities Eyes opn;Head turns;Other/ Comments  sliding single UE forward on bar 3"         Prosthetics Assessment - 08/18/14 [place] with Skin check;Residual limb care;Prosthetic cleaning;Ply sock cleaning;Correct ply sock adjustment;Proper wear schedule/adjustment;Proper weight-bearing  schedule/adjustment;Other (comment)  donning   Donning prosthesis  Mod assist   Doffing prosthesis  Min assist   Current prosthetic wear tolerance (days/week)  7 days /wk   Current prosthetic wear tolerance (#hours/day)  51mn 1x/day   Current prosthetic weight-bearing tolerance (hours/day)  3 minutes during evaluation including gait   Edema none noted   Residual limb condition  dry scaly skin, no open areas,                   [facility] Adult PT Treatment/Exercise - 08/18/14 1445    Prosthetics   Education Provided Skin check;Residual limb care;Prosthetic cleaning;Correct ply sock adjustment;Proper Donning;Proper wear schedule/adjustment;Proper weight-bearing schedule/adjustment  initiate wear sitting only w/ feet supported 2hrs 2x/day   Person(s) Educated Patient;Child(ren)   Education Method Explanation;Demonstration;Tactile cues;Verbal cues   Education Method Verbalized understanding;Returned demonstration;Tactile cues required;Verbal cues required;Needs further instruction                  PT Short Term Goals - 08/18/14 1445    PT SHORT TERM GOAL #1   Title Patient donnes prostheses with cues only. (Target Date: 09/16/2014)   Time 1   Period Months   Status New   PT SHORT TERM GOAL #2   Title Patient tolerates bilateral prostheses wear >6hrs total per day without skin issues or limb pain.  (Target Date: 09/16/2014)   Time 1   Period Months   Status New   PT SHORT TERM GOAL #3   Title Squat-pivot transfer w/c to level mat modified independent with buttock clearance.  (Target Date: 09/16/2014)   Time 1   Period Months   Status New   PT SHORT TERM GOAL #4   Title Sit to / from stand w/c to parallel bars with moderate assist  (Target Date: 09/16/2014)   Time 1   Period  Months   Status New   PT SHORT TERM GOAL #5   Title Patient ambulates 10' in parallel bars with prostheses with max assist of 1 person with 2nd person supervising for safety.  (Target Date: 09/16/2014)    Time 1   Period Months   Status New           PT Long Term Goals - 09-10-2014 1445    PT LONG TERM GOAL #1   Title Tolerates wear of prostheses >90% of awake hours without change in skin integrity nor undue tenderness.  (Target Date: 12/16/2014)   Time 4   Period Months   Status New   PT LONG TERM GOAL #2   Title Patient demonstrates / verbalizes proper prosthetic care for safe use of prostheses without issues.  (Target Date: 12/16/2014)   Time 4   Period Months   Status New   PT LONG TERM GOAL #3   Title Sit to /from stand and stand-pivot transfers with RW & prostheses with minimal assist.  (Target Date: 12/16/2014)   Time 4   Period Months   Status New   PT LONG TERM GOAL #4   Title Standing balance with RW & prostheses manages clothes & reaches 5" with supervision.  (Target Date: 12/16/2014)   Time 4   Period Months   Status New   PT LONG TERM GOAL #5   Title Patient ambulates 56' with RW & prostheses with minimal assist.  (Target Date: 12/16/2014)   Time 4   Period Months   Status New               Plan - 2014-09-10 1445    Clinical Impression Statement This 69yo male has vascular problems with wounds with partial foot amputations from 11/10/2012. He underwent a left Transtibial Amputation 05/14/2013 that was revised 07/23/2013 and a right Transtibial Amputation on 02/18/2014 with revision 05/20/2014. He recieved bilateral prostheses on 08/11/2014. He is dependent in prostheses use with limited wear to only 30 minutes per day which significantly limits all mobility without any feet to support himself and this also increases risk of falls. He is dependent in all transfers, standing balance & gait.                                     Pt will benefit from skilled therapeutic intervention in order to improve on the following deficits Abnormal gait;Decreased activity tolerance;Decreased balance;Decreased endurance;Decreased knowledge of precautions;Decreased knowledge of use of  DME;Decreased mobility;Decreased range of motion;Decreased strength;Postural dysfunction;Prosthetic Dependency   Rehab Potential Good   Clinical Impairments Affecting Rehab Potential Dialysis limits strength and endurance with slow gains.    PT Frequency 2x / week   PT Duration Other (comment)  17 weeks (4 months - 120 days)   PT Treatment/Interventions ADLs/Self Care Home Management;DME Instruction;Gait training;Functional mobility training;Therapeutic activities;Therapeutic exercise;Balance training;Neuromuscular re-education;Patient/family education;Prosthetic Training;Passive range of motion   PT Next Visit Plan HEP for flexibilty & strength sitting & on bed / mat. Review prosthetic care   Consulted and Agree with Plan of Care Patient;Family member/caregiver   Family Member Consulted dtr          G-Codes - September 10, 2014 1445    Functional Assessment Tool Used tolerates wear of bilateral prostheses 30 minutes per day, donnes prostheses with moderate assist and skilled instruction, dependent in prosthetic care   Functional Limitation Self care   Self Care Current  Status [phone]) At least 80 percent but less than 100 percent impaired, limited or restricted   Self Care Goal Status ([id number]) At least 20 percent but less than 40 percent impaired, limited or restricted       Problem List Patient Active Problem List   Diagnosis Date Noted  . Complications, amputation stump late 05/20/2014  . Weakness generalized 04/07/2014  . Sepsis 04/07/2014  . Decubitus ulcer, stage II 04/07/2014  . Decubitus ulcer of ankle   . Fatigue   . Wound infection 04/06/2014  . Decubitus ulcer of sacral region, stage 2 03/07/2014  . Glaucoma 03/07/2014  . Gout 03/07/2014  . Below knee amputation status 02/18/2014  . Osteomyelitis 01/04/2014  . Phantom limb 12/14/2013  . Type 1 diabetes mellitus with diabetic foot infection   . Diabetes mellitus with peripheral vascular disease   . Asthma 05/17/2013  .  History of MRSA infection 04/22/2013  . Peripheral vascular disease 11/19/2012  . End-stage renal disease on hemodialysis 05/05/2012  . Kahler disease 03/30/2012  . Anemia in chronic kidney disease 03/19/2012  . Hypothyroidism 03/19/2012  . Anemia, iron deficiency 03/19/2012  . Hypertension 03/18/2012  . Chronic kidney disease (CKD), stage V 03/18/2012  . Cardiac conduction disorder 03/18/2012  . MGUS (monoclonal gammopathy of unknown significance) 02/28/2011  . Monoclonal paraproteinemia 02/28/2011    Jamey Reas PT, DPT 08/19/2014, 3:36 PM  [facility] [address] [place], [place], [address] Phone: [phone]   Fax:  [phone]

## 2014-08-23 ENCOUNTER — Encounter: Payer: Self-pay | Admitting: Physical Therapy

## 2014-08-23 ENCOUNTER — Ambulatory Visit: Payer: Medicare Other | Admitting: Physical Therapy

## 2014-08-23 DIAGNOSIS — R269 Unspecified abnormalities of gait and mobility: Secondary | ICD-10-CM | POA: Diagnosis not present

## 2014-08-23 DIAGNOSIS — R6889 Other general symptoms and signs: Secondary | ICD-10-CM

## 2014-08-23 DIAGNOSIS — R29898 Other symptoms and signs involving the musculoskeletal system: Secondary | ICD-10-CM

## 2014-08-23 DIAGNOSIS — M24669 Ankylosis, unspecified knee: Secondary | ICD-10-CM

## 2014-08-23 DIAGNOSIS — M25669 Stiffness of unspecified knee, not elsewhere classified: Secondary | ICD-10-CM

## 2014-08-23 DIAGNOSIS — M25659 Stiffness of unspecified hip, not elsewhere classified: Secondary | ICD-10-CM

## 2014-08-23 NOTE — Patient Instructions (Signed)
Chair Sitting   Sit at edge of seat, spine straight, one leg extended. Put a hand on each thigh and bend forward from the hip, keeping spine straight. Allow hand on extended leg to reach toward toes. Support upper body with other arm. Hold _30__ seconds. Repeat __3_ times each leg. Do _1-2__ sessions per day.  Copyright  VHI. All rights reserved.  Sitting Chair Flexion   With band around knees: Bring knee up toward chest. Hold for 5 seconds.  Repeat with other leg Repeat _10__ times each leg. Do _1-2___ sessions per day.  http://[email or web address].exer.us/391   Copyright  VHI. All rights reserved.  Knee Extension: Sitting (Single Leg)    With red band around ankles: straighten one leg up, hold 3 sec's. Repeat 10 reps each leg. 1-2 times a day.  http://tub.exer.us/55   Copyright  VHI. All rights reserved.  Trunk Rotation   Sit with feet flat on floor, hands clasped out in front. Rotate trunk toward right side. Hold _5___ seconds. Then rotate toward the left side. Hold for 5 seconds. Repeat _10__ times each side. Do _1-2_ sessions per day.   Copyright  VHI. All rights reserved.  Sitting Side Tilt   Tilt upper body as far as possible to one side. Return to midline. Repeat to other side. Repeat __10 times each side. Do _1-2___ sessions per day.  http://[email or web address].exer.us/441   Copyright  VHI. All rights reserved.

## 2014-08-24 NOTE — Therapy (Signed)
[facility] [address] [place], [place], [address] Phone: [phone]   Fax:  [phone]  Physical Therapy Treatment  Patient Details  Name: Jack [name] MRN: [id number] Date of Birth: 07/08/1945 Referring Provider:  Lucianne Lei, MD  Encounter Date: 08/23/2014      PT End of Session - 08/23/14 1540    Visit Number 2   Number of Visits 35   Date for PT Re-Evaluation 10/17/14   Authorization Type G-Code every 10 visits   PT Start Time 3817   PT Stop Time 1615   PT Time Calculation (min) 41 min   Equipment Utilized During Treatment Gait belt   Activity Tolerance Patient limited by fatigue;Patient tolerated treatment well   Behavior During Therapy [facility] for tasks assessed/performed      Past Medical History  Diagnosis Date  . Hypertension   . Pneumonia     2012  . Heart murmur   . Glaucoma   . Multiple myeloma, without mention of having achieved remission 03/30/2012    Cytogenetic neg on 03/23/2012.  . Asthma   . Hyperparathyroidism, secondary renal   . Peripheral arterial disease   . End-stage renal disease on hemodialysis     Started HD March 2014.  Cause of ESRD was DM.  Gets HD at [facility] on [place] on MWF schedule.   . Thyroid disease     hyperparathyroidism  . MRSA bacteremia   . Anemia   . Peripheral vascular disease, unspecified 11/19/2012    In the past had R foot toe amps then R TMA. In 2015 had left foot toe amp > then TMA >then L BKA on 05/14/13   . History of MRSA infection 04/22/2013    Bacteremia assoc w L foot wound infection Mar 2015   . Gangrene of foot   . ESRD on hemodialysis   . Diabetes mellitus with peripheral vascular disease   . Hypothyroidism   . Decubitus ulcer of sacral region, stage 2 03/07/2014  . Glaucoma 03/07/2014  . Gangrene     right BKA  . Contracture of joint     left knee    Past Surgical History  Procedure Laterality Date  . Thyroidectomy    . Cervical  disc surgery    . Insertion of dialysis catheter Right 03/19/2012    Procedure: INSERTION OF DIALYSIS CATHETER;  Surgeon: Mal Misty, MD;  Location: [place];  Service: Vascular;  Laterality: Right;  Right Internal Jugular  . Av fistula placement Left 03/25/2012    Procedure: ARTERIOVENOUS (AV) FISTULA CREATION;  Surgeon: Mal Misty, MD;  Location: [place];  Service: Vascular;  Laterality: Left;  . Ligation of competing branches of arteriovenous fistula Left 05/08/2012    Procedure: LIGATION OF COMPETING BRANCHES OF ARTERIOVENOUS FISTULA;  Surgeon: Mal Misty, MD;  Location: [place];  Service: Vascular;  Laterality: Left;  Ultrasound guided  . Cardiac catheterization      approx 30 years ago  . Amputation Right 11/10/2012    Procedure: AMPUTATION FIRST and SECOND TOES Right Foot;  Surgeon: Elam Dutch, MD;  Location: [facility] OR;  Service: Vascular;  Laterality: Right;  . Transmetatarsal amputation Left 12/16/2012    Procedure: TRANSMETATARSAL AMPUTATION AND VAC PLACEMENT;  Surgeon: Elam Dutch, MD;  Location: [place];  Service: Vascular;  Laterality: Left;  . Amputation Left 04/07/2013    Procedure: AMPUTATION DIGIT- LEFT 1ST TOE;  Surgeon: Mal Misty, MD;  Location: [place];  Service: Vascular;  Laterality: Left;  . Tee without cardioversion N/A 04/20/2013    Procedure: TRANSESOPHAGEAL ECHOCARDIOGRAM (TEE);  Surgeon: Josue Hector, MD;  Location: [facility] ENDOSCOPY;  Service: Cardiovascular;  Laterality: N/A;  . I&d extremity Left 04/22/2013    Procedure: [id number] OTL DEBRIDEMENT LEFT FIRST TOE AMPUTATION WOUND ;  Surgeon: Mal Misty, MD;  Location: [place];  Service: Vascular;  Laterality: Left;  . Amputation Left 04/26/2013    Procedure: Left Foot Transmetatarsal Amputation;  Surgeon: Newt Minion, MD;  Location: [place];  Service: Orthopedics;  Laterality: Left;  . Toe amputation      D/C 04-30-13  . Below knee leg amputation Left 05/14/2013    DR [name]  . Amputation Left 05/14/2013     Procedure: AMPUTATION BELOW KNEE;  Surgeon: Newt Minion, MD;  Location: [place];  Service: Orthopedics;  Laterality: Left;  Left Below Knee Amputation  . Eye surgery Bilateral     CATARACTS  . Amputation Left 07/23/2013    Procedure: AMPUTATION BELOW KNEE;  Surgeon: Newt Minion, MD;  Location: [place];  Service: Orthopedics;  Laterality: Left;  Left Below Knee Amputation Revision  . Abdominal aortagram Bilateral 11/06/2012    Procedure: ABDOMINAL AORTAGRAM;  Surgeon: Elam Dutch, MD;  Location: [facility] CATH LAB;  Service: Cardiovascular;  Laterality: Bilateral;  . I&d extremity Right 01/05/2014    Procedure: IRRIGATION AND DEBRIDEMENT Right Heel Ulcer;  Surgeon: Mcarthur Rossetti, MD;  Location: [place];  Service: Orthopedics;  Laterality: Right;  Surgeon Available after 5PM  . Below knee leg amputation Right 02/18/2014    dr duda  . Amputation Right 02/18/2014    Procedure: AMPUTATION BELOW KNEE;  Surgeon: Newt Minion, MD;  Location: [place];  Service: Orthopedics;  Laterality: Right;  . Stump revision Right 05/20/2014    Procedure: Revision Right Below Knee Amputation;  Surgeon: Newt Minion, MD;  Location: [place];  Service: Orthopedics;  Laterality: Right;  . Repair quadriceps/hamstring muscles Left 05/20/2014    Procedure: Left Hamstring Release;  Surgeon: Newt Minion, MD;  Location: [place];  Service: Orthopedics;  Laterality: Left;    There were no vitals filed for this visit.  Visit Diagnosis:  Decreased functional activity tolerance  Weakness of both legs  Decreased range of hip movement, unspecified laterality  Decreased ROM of lower extremity  Activity intolerance  Decreased range of knee movement, unspecified laterality      Subjective Assessment - 08/23/14 1537    Subjective No new complaints. No falls or pain to report. Did have have phantom pain really bad yesterday.   Currently in Pain? No/denies     Treatment:   08/23/14 0001  Prosthetics  Current prosthetic  wear tolerance (days/week)  7 days /wk  Current prosthetic wear tolerance (#hours/day)  2 hours 2 x day  Edema none noted  Residual limb condition  dry scaly skin, no open areas,   Education Provided Residual limb care;Proper wear schedule/adjustment;Ply sock cleaning  Person(s) Educated Patient  Education Method Explanation;Verbal cues  Education Method Verbalized understanding;Verbal cues required   Supervision to transfer from wheelchair to mat with bil prostheses on.  Educated and issued exercises for home exercise program. Refer to pt instruction/education section for full details.        PT Education - 08/23/14 1613    Education provided Yes   Education Details HEP: seated hamstring stretch, with red band long arc quads and alternating mraches, unsupported seated trunk rotation  left<>right, and unsupported seated lateral reaching to floor (alternating sides).                           Person(s) Educated Patient   Methods Explanation;Demonstration;Handout;Verbal cues   Comprehension Verbalized understanding;Verbal cues required          PT Short Term Goals - 08/18/14 1445    PT SHORT TERM GOAL #1   Title Patient donnes prostheses with cues only. (Target Date: 09/16/2014)   Time 1   Period Months   Status New   PT SHORT TERM GOAL #2   Title Patient tolerates bilateral prostheses wear >6hrs total per day without skin issues or limb pain.  (Target Date: 09/16/2014)   Time 1   Period Months   Status New   PT SHORT TERM GOAL #3   Title Squat-pivot transfer w/c to level mat modified independent with buttock clearance.  (Target Date: 09/16/2014)   Time 1   Period Months   Status New   PT SHORT TERM GOAL #4   Title Sit to / from stand w/c to parallel bars with moderate assist  (Target Date: 09/16/2014)   Time 1   Period Months   Status New   PT SHORT TERM GOAL #5   Title Patient ambulates 10' in parallel bars with prostheses with max assist of 1 person with 2nd person  supervising for safety.  (Target Date: 09/16/2014)   Time 1   Period Months   Status New           PT Long Term Goals - 08/18/14 1445    PT LONG TERM GOAL #1   Title Tolerates wear of prostheses >90% of awake hours without change in skin integrity nor undue tenderness.  (Target Date: 12/16/2014)   Time 4   Period Months   Status New   PT LONG TERM GOAL #2   Title Patient demonstrates / verbalizes proper prosthetic care for safe use of prostheses without issues.  (Target Date: 12/16/2014)   Time 4   Period Months   Status New   PT LONG TERM GOAL #3   Title Sit to /from stand and stand-pivot transfers with RW & prostheses with minimal assist.  (Target Date: 12/16/2014)   Time 4   Period Months   Status New   PT LONG TERM GOAL #4   Title Standing balance with RW & prostheses manages clothes & reaches 5" with supervision.  (Target Date: 12/16/2014)   Time 4   Period Months   Status New   PT LONG TERM GOAL #5   Title Patient ambulates 75' with RW & prostheses with minimal assist.  (Target Date: 12/16/2014)   Time 4   Period Months   Status New           Plan - 08/23/14 1540    Clinical Impression Statement Pt edcuated on and issued HEP for strengthening and balance in seated position without issues reported. Pt making steady progress toward goals.   Pt will benefit from skilled therapeutic intervention in order to improve on the following deficits Abnormal gait;Decreased activity tolerance;Decreased balance;Decreased endurance;Decreased knowledge of precautions;Decreased knowledge of use of DME;Decreased mobility;Decreased range of motion;Decreased strength;Postural dysfunction;Prosthetic Dependency   Rehab Potential Good   Clinical Impairments Affecting Rehab Potential Dialysis limits strength and endurance with slow gains.    PT Frequency 2x / week   PT Duration Other (comment)  17 weeks (4 months - 120 days)  PT Treatment/Interventions ADLs/Self Care Home Management;DME  Instruction;Gait training;Functional mobility training;Therapeutic activities;Therapeutic exercise;Balance training;Neuromuscular re-education;Patient/family education;Prosthetic Training;Passive range of motion   PT Next Visit Plan  Review prosthetic care. standing at sink, HEP at sink if appropriate.   Consulted and Agree with Plan of Care Patient;Family member/caregiver   Family Member Consulted dtr        Problem List Patient Active Problem List   Diagnosis Date Noted  . Complications, amputation stump late 05/20/2014  . Weakness generalized 04/07/2014  . Sepsis 04/07/2014  . Decubitus ulcer, stage II 04/07/2014  . Decubitus ulcer of ankle   . Fatigue   . Wound infection 04/06/2014  . Decubitus ulcer of sacral region, stage 2 03/07/2014  . Glaucoma 03/07/2014  . Gout 03/07/2014  . Below knee amputation status 02/18/2014  . Osteomyelitis 01/04/2014  . Phantom limb 12/14/2013  . Type 1 diabetes mellitus with diabetic foot infection   . Diabetes mellitus with peripheral vascular disease   . Asthma 05/17/2013  . History of MRSA infection 04/22/2013  . Peripheral vascular disease 11/19/2012  . End-stage renal disease on hemodialysis 05/05/2012  . Kahler disease 03/30/2012  . Anemia in chronic kidney disease 03/19/2012  . Hypothyroidism 03/19/2012  . Anemia, iron deficiency 03/19/2012  . Hypertension 03/18/2012  . Chronic kidney disease (CKD), stage V 03/18/2012  . Cardiac conduction disorder 03/18/2012  . MGUS (monoclonal gammopathy of unknown significance) 02/28/2011  . Monoclonal paraproteinemia 02/28/2011    Willow Ora 08/24/2014, 6:45 PM  Willow Ora, PTA, [facility] [address] [place], [place] [address] [phone] 08/24/2014, 6:45 PM

## 2014-08-25 ENCOUNTER — Encounter: Payer: Self-pay | Admitting: Physical Therapy

## 2014-08-25 ENCOUNTER — Ambulatory Visit: Payer: Medicare Other | Admitting: Physical Therapy

## 2014-08-25 DIAGNOSIS — M25659 Stiffness of unspecified hip, not elsewhere classified: Secondary | ICD-10-CM

## 2014-08-25 DIAGNOSIS — M25669 Stiffness of unspecified knee, not elsewhere classified: Secondary | ICD-10-CM

## 2014-08-25 DIAGNOSIS — R6889 Other general symptoms and signs: Secondary | ICD-10-CM

## 2014-08-25 DIAGNOSIS — R269 Unspecified abnormalities of gait and mobility: Secondary | ICD-10-CM | POA: Diagnosis not present

## 2014-08-25 DIAGNOSIS — Z89512 Acquired absence of left leg below knee: Secondary | ICD-10-CM

## 2014-08-25 DIAGNOSIS — R2689 Other abnormalities of gait and mobility: Secondary | ICD-10-CM

## 2014-08-25 DIAGNOSIS — Z89511 Acquired absence of right leg below knee: Secondary | ICD-10-CM

## 2014-08-25 DIAGNOSIS — R29898 Other symptoms and signs involving the musculoskeletal system: Secondary | ICD-10-CM

## 2014-08-26 NOTE — Therapy (Signed)
[facility] [address] [place], [place], [address] Phone: [phone]   Fax:  [phone]  Physical Therapy Treatment  Patient Details  Name: Jack [name] MRN: [id number] Date of Birth: 16-Oct-1945 Referring Provider:  Lucianne Lei, MD  Encounter Date: 08/25/2014      PT End of Session - 08/25/14 1324    Visit Number 3   Number of Visits 35   Date for PT Re-Evaluation 10/17/14   Authorization Type G-Code every 10 visits   PT Start Time 1320   PT Stop Time 1400   PT Time Calculation (min) 40 min   Equipment Utilized During Treatment Gait belt   Activity Tolerance Patient limited by fatigue;Patient tolerated treatment well   Behavior During Therapy [facility] for tasks assessed/performed      Past Medical History  Diagnosis Date  . Hypertension   . Pneumonia     2012  . Heart murmur   . Glaucoma   . Multiple myeloma, without mention of having achieved remission 03/30/2012    Cytogenetic neg on 03/23/2012.  . Asthma   . Hyperparathyroidism, secondary renal   . Peripheral arterial disease   . End-stage renal disease on hemodialysis     Started HD March 2014.  Cause of ESRD was DM.  Gets HD at [facility] on [place] on MWF schedule.   . Thyroid disease     hyperparathyroidism  . MRSA bacteremia   . Anemia   . Peripheral vascular disease, unspecified 11/19/2012    In the past had R foot toe amps then R TMA. In 2015 had left foot toe amp > then TMA >then L BKA on 05/14/13   . History of MRSA infection 04/22/2013    Bacteremia assoc w L foot wound infection Mar 2015   . Gangrene of foot   . ESRD on hemodialysis   . Diabetes mellitus with peripheral vascular disease   . Hypothyroidism   . Decubitus ulcer of sacral region, stage 2 03/07/2014  . Glaucoma 03/07/2014  . Gangrene     right BKA  . Contracture of joint     left knee    Past Surgical History  Procedure Laterality Date  . Thyroidectomy    . Cervical  disc surgery    . Insertion of dialysis catheter Right 03/19/2012    Procedure: INSERTION OF DIALYSIS CATHETER;  Surgeon: Mal Misty, MD;  Location: [place];  Service: Vascular;  Laterality: Right;  Right Internal Jugular  . Av fistula placement Left 03/25/2012    Procedure: ARTERIOVENOUS (AV) FISTULA CREATION;  Surgeon: Mal Misty, MD;  Location: [place];  Service: Vascular;  Laterality: Left;  . Ligation of competing branches of arteriovenous fistula Left 05/08/2012    Procedure: LIGATION OF COMPETING BRANCHES OF ARTERIOVENOUS FISTULA;  Surgeon: Mal Misty, MD;  Location: [place];  Service: Vascular;  Laterality: Left;  Ultrasound guided  . Cardiac catheterization      approx 30 years ago  . Amputation Right 11/10/2012    Procedure: AMPUTATION FIRST and SECOND TOES Right Foot;  Surgeon: Elam Dutch, MD;  Location: [facility] OR;  Service: Vascular;  Laterality: Right;  . Transmetatarsal amputation Left 12/16/2012    Procedure: TRANSMETATARSAL AMPUTATION AND VAC PLACEMENT;  Surgeon: Elam Dutch, MD;  Location: [place];  Service: Vascular;  Laterality: Left;  . Amputation Left 04/07/2013    Procedure: AMPUTATION DIGIT- LEFT 1ST TOE;  Surgeon: Mal Misty, MD;  Location: [place];  Service: Vascular;  Laterality: Left;  . Tee without cardioversion N/A 04/20/2013    Procedure: TRANSESOPHAGEAL ECHOCARDIOGRAM (TEE);  Surgeon: Josue Hector, MD;  Location: [facility] ENDOSCOPY;  Service: Cardiovascular;  Laterality: N/A;  . I&d extremity Left 04/22/2013    Procedure: [id number] UXL DEBRIDEMENT LEFT FIRST TOE AMPUTATION WOUND ;  Surgeon: Mal Misty, MD;  Location: [place];  Service: Vascular;  Laterality: Left;  . Amputation Left 04/26/2013    Procedure: Left Foot Transmetatarsal Amputation;  Surgeon: Newt Minion, MD;  Location: [place];  Service: Orthopedics;  Laterality: Left;  . Toe amputation      D/C 04-30-13  . Below knee leg amputation Left 05/14/2013    DR [name]  . Amputation Left 05/14/2013     Procedure: AMPUTATION BELOW KNEE;  Surgeon: Newt Minion, MD;  Location: [place];  Service: Orthopedics;  Laterality: Left;  Left Below Knee Amputation  . Eye surgery Bilateral     CATARACTS  . Amputation Left 07/23/2013    Procedure: AMPUTATION BELOW KNEE;  Surgeon: Newt Minion, MD;  Location: [place];  Service: Orthopedics;  Laterality: Left;  Left Below Knee Amputation Revision  . Abdominal aortagram Bilateral 11/06/2012    Procedure: ABDOMINAL AORTAGRAM;  Surgeon: Elam Dutch, MD;  Location: [facility] CATH LAB;  Service: Cardiovascular;  Laterality: Bilateral;  . I&d extremity Right 01/05/2014    Procedure: IRRIGATION AND DEBRIDEMENT Right Heel Ulcer;  Surgeon: Mcarthur Rossetti, MD;  Location: [place];  Service: Orthopedics;  Laterality: Right;  Surgeon Available after 5PM  . Below knee leg amputation Right 02/18/2014    dr duda  . Amputation Right 02/18/2014    Procedure: AMPUTATION BELOW KNEE;  Surgeon: Newt Minion, MD;  Location: [place];  Service: Orthopedics;  Laterality: Right;  . Stump revision Right 05/20/2014    Procedure: Revision Right Below Knee Amputation;  Surgeon: Newt Minion, MD;  Location: [place];  Service: Orthopedics;  Laterality: Right;  . Repair quadriceps/hamstring muscles Left 05/20/2014    Procedure: Left Hamstring Release;  Surgeon: Newt Minion, MD;  Location: [place];  Service: Orthopedics;  Laterality: Left;    There were no vitals filed for this visit.  Visit Diagnosis:  Decreased functional activity tolerance  Weakness of both legs  Decreased range of hip movement, unspecified laterality  Decreased ROM of lower extremity  Activity intolerance  Balance problems  Status post bilateral below knee amputation      Subjective Assessment - 08/25/14 1324    Subjective No new complaints. No falls or pain to report. HEP going well at home.   Currently in Pain? No/denies           [facility] Adult PT Treatment/Exercise - 08/25/14 1325    Transfers   Sit to  Stand 3: Mod assist;With upper extremity assist;From chair/3-in-1  second person for safety   Sit to Stand Details Verbal cues for sequencing;Verbal cues for technique;Verbal cues for precautions/safety;Tactile cues for weight shifting;Tactile cues for placement;Manual facilitation for weight shifting;Other (comment);Manual facilitation for weight bearing   Sit to Stand Details (indicate cue type and reason) cues/assist for scooting forward, hand placement (one on parallel bars and one on wheelchair armrest). cues/facilitaiton for weight shifting and bil knees blocked.                                 x 3 reps   Stand to Sit 3:  Mod assist;To chair/3-in-1;With upper extremity assist  second person for safety   Stand to Sit Details (indicate cue type and reason) Verbal cues for technique;Verbal cues for precautions/safety;Manual facilitation for weight shifting;Verbal cues for safe use of DME/AE   Stand to Sit Details cues on technique to reach back and on technique to sit into wheelchair (bow after looking to see that he is lined up with the chair)   Prosthetics   Current prosthetic wear tolerance (days/week)  7 days /wk   Current prosthetic wear tolerance (#hours/day)  2 hours 2 x day   Edema none noted   Residual limb condition  dry scaly skin, no open areas,    Education Provided Residual limb care;Proper wear schedule/adjustment   Person(s) Educated Patient   Education Method Explanation   Education Method Verbalized understanding   Donning Prosthesis Minimal assist   Doffing Prosthesis Minimal assist     standing in parallel bars x 3 reps: Focused on upright posture, progressing to lateral weight shifting and anterior/posterior weight shifting. Pt with increased pain on right knee, decreased with adding of sock.   supervision for transfer wheelchair <> mat table.  on mat table, worked on passive stretching of bil IT bands and hip rotators.         PT Short Term Goals - 08/18/14  1445    PT SHORT TERM GOAL #1   Title Patient donnes prostheses with cues only. (Target Date: 09/16/2014)   Time 1   Period Months   Status New   PT SHORT TERM GOAL #2   Title Patient tolerates bilateral prostheses wear >6hrs total per day without skin issues or limb pain.  (Target Date: 09/16/2014)   Time 1   Period Months   Status New   PT SHORT TERM GOAL #3   Title Squat-pivot transfer w/c to level mat modified independent with buttock clearance.  (Target Date: 09/16/2014)   Time 1   Period Months   Status New   PT SHORT TERM GOAL #4   Title Sit to / from stand w/c to parallel bars with moderate assist  (Target Date: 09/16/2014)   Time 1   Period Months   Status New   PT SHORT TERM GOAL #5   Title Patient ambulates 10' in parallel bars with prostheses with max assist of 1 person with 2nd person supervising for safety.  (Target Date: 09/16/2014)   Time 1   Period Months   Status New           PT Long Term Goals - 08/18/14 1445    PT LONG TERM GOAL #1   Title Tolerates wear of prostheses >90% of awake hours without change in skin integrity nor undue tenderness.  (Target Date: 12/16/2014)   Time 4   Period Months   Status New   PT LONG TERM GOAL #2   Title Patient demonstrates / verbalizes proper prosthetic care for safe use of prostheses without issues.  (Target Date: 12/16/2014)   Time 4   Period Months   Status New   PT LONG TERM GOAL #3   Title Sit to /from stand and stand-pivot transfers with RW & prostheses with minimal assist.  (Target Date: 12/16/2014)   Time 4   Period Months   Status New   PT LONG TERM GOAL #4   Title Standing balance with RW & prostheses manages clothes & reaches 5" with supervision.  (Target Date: 12/16/2014)   Time 4   Period Months  Status New   PT LONG TERM GOAL #5   Title Patient ambulates 41' with RW & prostheses with minimal assist.  (Target Date: 12/16/2014)   Time 4   Period Months   Status New               Plan - 08/25/14  1324    Clinical Impression Statement Pt with increased standing time and less assistance needed today in parallel bars. Pt does have tight hip abductors/IT band's on bil sides. Worked on stretching of these today as well. Pt making steady progress toward goals.   Pt will benefit from skilled therapeutic intervention in order to improve on the following deficits Abnormal gait;Decreased activity tolerance;Decreased balance;Decreased endurance;Decreased knowledge of precautions;Decreased knowledge of use of DME;Decreased mobility;Decreased range of motion;Decreased strength;Postural dysfunction;Prosthetic Dependency   Rehab Potential Good   Clinical Impairments Affecting Rehab Potential Dialysis limits strength and endurance with slow gains.    PT Frequency 2x / week   PT Duration Other (comment)  17 weeks (4 months - 120 days)   PT Treatment/Interventions ADLs/Self Care Home Management;DME Instruction;Gait training;Functional mobility training;Therapeutic activities;Therapeutic exercise;Balance training;Neuromuscular re-education;Patient/family education;Prosthetic Training;Passive range of motion   PT Next Visit Plan  Review prosthetic care. continue with standing in parallel bars and progress to sink standing as able. continue with lower extremity strengthening.   Consulted and Agree with Plan of Care Patient;Family member/caregiver   Family Member Consulted dtr        Problem List Patient Active Problem List   Diagnosis Date Noted  . Complications, amputation stump late 05/20/2014  . Weakness generalized 04/07/2014  . Sepsis 04/07/2014  . Decubitus ulcer, stage II 04/07/2014  . Decubitus ulcer of ankle   . Fatigue   . Wound infection 04/06/2014  . Decubitus ulcer of sacral region, stage 2 03/07/2014  . Glaucoma 03/07/2014  . Gout 03/07/2014  . Below knee amputation status 02/18/2014  . Osteomyelitis 01/04/2014  . Phantom limb 12/14/2013  . Type 1 diabetes mellitus with diabetic  foot infection   . Diabetes mellitus with peripheral vascular disease   . Asthma 05/17/2013  . History of MRSA infection 04/22/2013  . Peripheral vascular disease 11/19/2012  . End-stage renal disease on hemodialysis 05/05/2012  . Kahler disease 03/30/2012  . Anemia in chronic kidney disease 03/19/2012  . Hypothyroidism 03/19/2012  . Anemia, iron deficiency 03/19/2012  . Hypertension 03/18/2012  . Chronic kidney disease (CKD), stage V 03/18/2012  . Cardiac conduction disorder 03/18/2012  . MGUS (monoclonal gammopathy of unknown significance) 02/28/2011  . Monoclonal paraproteinemia 02/28/2011    Willow Ora 08/26/2014, 8:14 PM  [facility] [address] [place], [place], [address] Phone: [phone]   Fax:  [phone]

## 2014-08-30 ENCOUNTER — Ambulatory Visit: Payer: Medicare Other | Admitting: Physical Therapy

## 2014-08-30 ENCOUNTER — Encounter: Payer: Self-pay | Admitting: Physical Therapy

## 2014-08-30 DIAGNOSIS — R29898 Other symptoms and signs involving the musculoskeletal system: Secondary | ICD-10-CM

## 2014-08-30 DIAGNOSIS — R6889 Other general symptoms and signs: Secondary | ICD-10-CM

## 2014-08-30 DIAGNOSIS — M25669 Stiffness of unspecified knee, not elsewhere classified: Secondary | ICD-10-CM

## 2014-08-30 DIAGNOSIS — M25659 Stiffness of unspecified hip, not elsewhere classified: Secondary | ICD-10-CM

## 2014-08-30 DIAGNOSIS — R269 Unspecified abnormalities of gait and mobility: Secondary | ICD-10-CM | POA: Diagnosis not present

## 2014-08-30 DIAGNOSIS — R2689 Other abnormalities of gait and mobility: Secondary | ICD-10-CM

## 2014-08-30 NOTE — Patient Instructions (Signed)
Sink standing: get close to sink and lock wheelchair. Scoot to edge of wheelchair and place feet in proper place to stand. Have someone with you for safety/help as needed with belt around your waist to be used as needed. - using sink, pull yourself up into standing. Focus on tall posture (straighten hips and knees) - stand for 30 seconds, then rest. Repeat for 3 reps - slowly increase the standing time as current time becomes easy to do for the 3 reps (increase in 5 second increments). - do this 2 times a day (morning and evening) during the times you wear your prostheses.

## 2014-08-31 NOTE — Therapy (Signed)
[facility] [address] [place], [place], [address] Phone: [phone]   Fax:  [phone]  Physical Therapy Treatment  Patient Details  Name: Jack [name] MRN: [id number] Date of Birth: [birth year] Referring Provider:  Lucianne Lei, MD  Encounter Date: 08/30/2014      PT End of Session - 08/30/14 1409    Visit Number 4   Number of Visits 35   Date for PT Re-Evaluation 10/17/14   Authorization Type G-Code every 10 visits   PT Start Time 1403   PT Stop Time 1445   PT Time Calculation (min) 42 min   Equipment Utilized During Treatment Gait belt   Activity Tolerance Patient limited by fatigue;Patient tolerated treatment well   Behavior During Therapy [facility] for tasks assessed/performed      Past Medical History  Diagnosis Date  . Hypertension   . Pneumonia     2012  . Heart murmur   . Glaucoma   . Multiple myeloma, without mention of having achieved remission 03/30/2012    Cytogenetic neg on 03/23/2012.  . Asthma   . Hyperparathyroidism, secondary renal   . Peripheral arterial disease   . End-stage renal disease on hemodialysis     Started HD March 2014.  Cause of ESRD was DM.  Gets HD at [facility] on [address] on MWF schedule.   . Thyroid disease     hyperparathyroidism  . MRSA bacteremia   . Anemia   . Peripheral vascular disease, unspecified 11/19/2012    In the past had R foot toe amps then R TMA. In 2015 had left foot toe amp > then TMA >then L BKA on 05/14/13   . History of MRSA infection 04/22/2013    Bacteremia assoc w L foot wound infection Mar 2015   . Gangrene of foot   . ESRD on hemodialysis   . Diabetes mellitus with peripheral vascular disease   . Hypothyroidism   . Decubitus ulcer of sacral region, stage 2 03/07/2014  . Glaucoma 03/07/2014  . Gangrene     right BKA  . Contracture of joint     left knee    Past Surgical History  Procedure Laterality Date  . Thyroidectomy    . Cervical  disc surgery    . Insertion of dialysis catheter Right 03/19/2012    Procedure: INSERTION OF DIALYSIS CATHETER;  Surgeon: Mal Misty, MD;  Location: [place];  Service: Vascular;  Laterality: Right;  Right Internal Jugular  . Av fistula placement Left 03/25/2012    Procedure: ARTERIOVENOUS (AV) FISTULA CREATION;  Surgeon: Mal Misty, MD;  Location: [place];  Service: Vascular;  Laterality: Left;  . Ligation of competing branches of arteriovenous fistula Left 05/08/2012    Procedure: LIGATION OF COMPETING BRANCHES OF ARTERIOVENOUS FISTULA;  Surgeon: Mal Misty, MD;  Location: [place];  Service: Vascular;  Laterality: Left;  Ultrasound guided  . Cardiac catheterization      approx 30 years ago  . Amputation Right 11/10/2012    Procedure: AMPUTATION FIRST and SECOND TOES Right Foot;  Surgeon: Elam Dutch, MD;  Location: [facility] OR;  Service: Vascular;  Laterality: Right;  . Transmetatarsal amputation Left 12/16/2012    Procedure: TRANSMETATARSAL AMPUTATION AND VAC PLACEMENT;  Surgeon: Elam Dutch, MD;  Location: [place];  Service: Vascular;  Laterality: Left;  . Amputation Left 04/07/2013    Procedure: AMPUTATION DIGIT- LEFT 1ST TOE;  Surgeon: Mal Misty, MD;  Location: [place];  Service: Vascular;  Laterality: Left;  . Tee without cardioversion N/A 04/20/2013    Procedure: TRANSESOPHAGEAL ECHOCARDIOGRAM (TEE);  Surgeon: Josue Hector, MD;  Location: [facility] ENDOSCOPY;  Service: Cardiovascular;  Laterality: N/A;  . I&d extremity Left 04/22/2013    Procedure: KGYJEHUDJS HFW DEBRIDEMENT LEFT FIRST TOE AMPUTATION WOUND ;  Surgeon: Mal Misty, MD;  Location: [place];  Service: Vascular;  Laterality: Left;  . Amputation Left 04/26/2013    Procedure: Left Foot Transmetatarsal Amputation;  Surgeon: Newt Minion, MD;  Location: [place];  Service: Orthopedics;  Laterality: Left;  . Toe amputation      D/C 04-30-13  . Below knee leg amputation Left 05/14/2013    DR [name]  . Amputation Left 05/14/2013     Procedure: AMPUTATION BELOW KNEE;  Surgeon: Newt Minion, MD;  Location: [place];  Service: Orthopedics;  Laterality: Left;  Left Below Knee Amputation  . Eye surgery Bilateral     CATARACTS  . Amputation Left 07/23/2013    Procedure: AMPUTATION BELOW KNEE;  Surgeon: Newt Minion, MD;  Location: [place];  Service: Orthopedics;  Laterality: Left;  Left Below Knee Amputation Revision  . Abdominal aortagram Bilateral 11/06/2012    Procedure: ABDOMINAL AORTAGRAM;  Surgeon: Elam Dutch, MD;  Location: [facility] CATH LAB;  Service: Cardiovascular;  Laterality: Bilateral;  . I&d extremity Right 01/05/2014    Procedure: IRRIGATION AND DEBRIDEMENT Right Heel Ulcer;  Surgeon: Mcarthur Rossetti, MD;  Location: [place];  Service: Orthopedics;  Laterality: Right;  Surgeon Available after 5PM  . Below knee leg amputation Right 02/18/2014    dr duda  . Amputation Right 02/18/2014    Procedure: AMPUTATION BELOW KNEE;  Surgeon: Newt Minion, MD;  Location: [place];  Service: Orthopedics;  Laterality: Right;  . Stump revision Right 05/20/2014    Procedure: Revision Right Below Knee Amputation;  Surgeon: Newt Minion, MD;  Location: [place];  Service: Orthopedics;  Laterality: Right;  . Repair quadriceps/hamstring muscles Left 05/20/2014    Procedure: Left Hamstring Release;  Surgeon: Newt Minion, MD;  Location: [place];  Service: Orthopedics;  Laterality: Left;    There were no vitals filed for this visit.  Visit Diagnosis:  Weakness of both legs  Decreased functional activity tolerance  Activity intolerance  Balance problems  Decreased ROM of lower extremity  Decreased range of hip movement, unspecified laterality  Abnormality of gait      Subjective Assessment - 08/30/14 1408    Subjective No new complaints. No pain or falls to report.    Currently in Pain? No/denies          [facility] Adult PT Treatment/Exercise - 08/30/14 1410    Transfers   Sit to Stand 3: Mod assist;4: Min assist   Sit to Stand  Details Manual facilitation for weight bearing;Manual facilitation for weight shifting;Verbal cues for precautions/safety;Verbal cues for technique;Verbal cues for sequencing;Tactile cues for weight shifting;Tactile cues for posture   Sit to Stand Details (indicate cue type and reason) in parallell bars x 3 reps and at sink x 2 reps. cues on bil foot placment and posture with standing.                           Stand to Sit 3: Mod assist;4: Min assist   Stand to Sit Details (indicate cue type and reason) Verbal cues for sequencing;Verbal cues for technique;Verbal cues for precautions/safety;Verbal cues for gait pattern;Manual  facilitation for weight shifting;Manual facilitation for placement;Manual facilitation for weight bearing;Tactile cues for weight shifting;Tactile cues for posture   Stand to Sit Details cues tor reach back and to ensure he was lined up with the chair.   Ambulation/Gait   Ambulation/Gait Yes   Ambulation/Gait Assistance 3: Mod assist;2: Max assist  2cd person for safety   Ambulation/Gait Assistance Details cues on posture, knee extension, to advance hands along bars for increased support/stability. bil knees blocked in stance each time.                      Ambulation Distance (Feet) 5 Feet  x 2 reps   Assistive device Parallel bars;Prostheses   Gait Pattern Step-to pattern;Decreased step length - right;Decreased step length - left;Decreased stride length;Decreased hip/knee flexion - right;Decreased hip/knee flexion - left;Right flexed knee in stance;Left flexed knee in stance;Lateral hip instability;Trunk flexed;Poor foot clearance - left;Poor foot clearance - right   Ambulation Surface Level;Indoor   Prosthetics   Prosthetic Care Comments  Discussed consistent wear times on a daily basis for optimal results in building up tolerance to prosthetics.   Current prosthetic wear tolerance (days/week)  7 days /wk   Current prosthetic wear tolerance (#hours/day)  3-4 hours total    Edema none noted   Residual limb condition  dry scaly skin, no open areas,    Education Provided Skin check;Proper Donning;Proper Doffing;Proper wear schedule/adjustment;Correct ply sock adjustment   Person(s) Educated Patient   Education Method Explanation   Education Method Verbalized understanding;Verbal cues required   Donning Prosthesis Minimal assist   Doffing Prosthesis Minimal assist           PT Education - 08/30/14 1438    Education provided Yes   Education Details HEP: sink standing   Person(s) Educated Patient   Methods Explanation;Demonstration;Handout;Verbal cues   Comprehension Verbalized understanding;Returned demonstration;Verbal cues required;Need further instruction          PT Short Term Goals - 08/18/14 1445    PT SHORT TERM GOAL #1   Title Patient donnes prostheses with cues only. (Target Date: 09/16/2014)   Time 1   Period Months   Status New   PT SHORT TERM GOAL #2   Title Patient tolerates bilateral prostheses wear >6hrs total per day without skin issues or limb pain.  (Target Date: 09/16/2014)   Time 1   Period Months   Status New   PT SHORT TERM GOAL #3   Title Squat-pivot transfer w/c to level mat modified independent with buttock clearance.  (Target Date: 09/16/2014)   Time 1   Period Months   Status New   PT SHORT TERM GOAL #4   Title Sit to / from stand w/c to parallel bars with moderate assist  (Target Date: 09/16/2014)   Time 1   Period Months   Status New   PT SHORT TERM GOAL #5   Title Patient ambulates 10' in parallel bars with prostheses with max assist of 1 person with 2nd person supervising for safety.  (Target Date: 09/16/2014)   Time 1   Period Months   Status New           PT Long Term Goals - 08/18/14 1445    PT LONG TERM GOAL #1   Title Tolerates wear of prostheses >90% of awake hours without change in skin integrity nor undue tenderness.  (Target Date: 12/16/2014)   Time 4   Period Months   Status New   PT LONG  TERM GOAL  #2   Title Patient demonstrates / verbalizes proper prosthetic care for safe use of prostheses without issues.  (Target Date: 12/16/2014)   Time 4   Period Months   Status New   PT LONG TERM GOAL #3   Title Sit to /from stand and stand-pivot transfers with RW & prostheses with minimal assist.  (Target Date: 12/16/2014)   Time 4   Period Months   Status New   PT LONG TERM GOAL #4   Title Standing balance with RW & prostheses manages clothes & reaches 5" with supervision.  (Target Date: 12/16/2014)   Time 4   Period Months   Status New   PT LONG TERM GOAL #5   Title Patient ambulates 51' with RW & prostheses with minimal assist.  (Target Date: 12/16/2014)   Time 4   Period Months   Status New           Plan - 08/30/14 1410    Clinical Impression Statement Pt able to stand longer with less assistance today and progress to gait in parallel bars with mod to max assist. Issued standing at sink for HEP today. Pt making steady progress toward goals.   Pt will benefit from skilled therapeutic intervention in order to improve on the following deficits Abnormal gait;Decreased activity tolerance;Decreased balance;Decreased endurance;Decreased knowledge of precautions;Decreased knowledge of use of DME;Decreased mobility;Decreased range of motion;Decreased strength;Postural dysfunction;Prosthetic Dependency   Rehab Potential Good   Clinical Impairments Affecting Rehab Potential Dialysis limits strength and endurance with slow gains.    PT Frequency 2x / week   PT Duration Other (comment)  17 weeks (4 months - 120 days)   PT Treatment/Interventions ADLs/Self Care Home Management;DME Instruction;Gait training;Functional mobility training;Therapeutic activities;Therapeutic exercise;Balance training;Neuromuscular re-education;Patient/family education;Prosthetic Training;Passive range of motion   PT Next Visit Plan  Review prosthetic care. continue with standing in parallel bars and gait in parallel  bars. continue with lower extremity strengthening.   Consulted and Agree with Plan of Care Patient;Family member/caregiver   Family Member Consulted dtr        Problem List Patient Active Problem List   Diagnosis Date Noted  . Complications, amputation stump late 05/20/2014  . Weakness generalized 04/07/2014  . Sepsis 04/07/2014  . Decubitus ulcer, stage II 04/07/2014  . Decubitus ulcer of ankle   . Fatigue   . Wound infection 04/06/2014  . Decubitus ulcer of sacral region, stage 2 03/07/2014  . Glaucoma 03/07/2014  . Gout 03/07/2014  . Below knee amputation status 02/18/2014  . Osteomyelitis 01/04/2014  . Phantom limb 12/14/2013  . Type 1 diabetes mellitus with diabetic foot infection   . Diabetes mellitus with peripheral vascular disease   . Asthma 05/17/2013  . History of MRSA infection 04/22/2013  . Peripheral vascular disease 11/19/2012  . End-stage renal disease on hemodialysis 05/05/2012  . Kahler disease 03/30/2012  . Anemia in chronic kidney disease 03/19/2012  . Hypothyroidism 03/19/2012  . Anemia, iron deficiency 03/19/2012  . Hypertension 03/18/2012  . Chronic kidney disease (CKD), stage V 03/18/2012  . Cardiac conduction disorder 03/18/2012  . MGUS (monoclonal gammopathy of unknown significance) 02/28/2011  . Monoclonal paraproteinemia 02/28/2011    Willow Ora 08/31/2014, 7:43 PM  Willow Ora, PTA, [facility] [address] [place], [place] [address] [phone] 08/31/2014, 7:44 PM

## 2014-09-01 ENCOUNTER — Encounter: Payer: Self-pay | Admitting: Physical Therapy

## 2014-09-01 ENCOUNTER — Ambulatory Visit: Payer: Medicare Other | Admitting: Physical Therapy

## 2014-09-01 DIAGNOSIS — R269 Unspecified abnormalities of gait and mobility: Secondary | ICD-10-CM | POA: Diagnosis not present

## 2014-09-01 DIAGNOSIS — R29898 Other symptoms and signs involving the musculoskeletal system: Secondary | ICD-10-CM

## 2014-09-01 DIAGNOSIS — R6889 Other general symptoms and signs: Secondary | ICD-10-CM

## 2014-09-01 DIAGNOSIS — M25659 Stiffness of unspecified hip, not elsewhere classified: Secondary | ICD-10-CM

## 2014-09-01 DIAGNOSIS — M25669 Stiffness of unspecified knee, not elsewhere classified: Secondary | ICD-10-CM

## 2014-09-01 DIAGNOSIS — Z89512 Acquired absence of left leg below knee: Secondary | ICD-10-CM

## 2014-09-01 DIAGNOSIS — Z89511 Acquired absence of right leg below knee: Secondary | ICD-10-CM

## 2014-09-01 NOTE — Therapy (Signed)
[facility] [address] [place], [place], [address] Phone: [phone]   Fax:  [phone]  Physical Therapy Treatment  Patient Details  Name: Jack [name] MRN: [id number] Date of Birth: [birth year] Referring Provider:  Lucianne Lei, MD  Encounter Date: 09/01/2014      PT End of Session - 09/01/14 0811    Visit Number 5   Number of Visits 35   Date for PT Re-Evaluation 10/17/14   Authorization Type G-Code every 10 visits   PT Start Time 0803   PT Stop Time 0845   PT Time Calculation (min) 42 min   Equipment Utilized During Treatment Gait belt   Activity Tolerance Patient limited by fatigue;Patient tolerated treatment well   Behavior During Therapy [employer] for tasks assessed/performed      Past Medical History  Diagnosis Date  . Hypertension   . Pneumonia     2012  . Heart murmur   . Glaucoma   . Multiple myeloma, without mention of having achieved remission 03/30/2012    Cytogenetic neg on 03/23/2012.  . Asthma   . Hyperparathyroidism, secondary renal   . Peripheral arterial disease   . End-stage renal disease on hemodialysis     Started HD March 2014.  Cause of ESRD was DM.  Gets HD at [facility] on [place] on MWF schedule.   . Thyroid disease     hyperparathyroidism  . MRSA bacteremia   . Anemia   . Peripheral vascular disease, unspecified 11/19/2012    In the past had R foot toe amps then R TMA. In 2015 had left foot toe amp > then TMA >then L BKA on 05/14/13   . History of MRSA infection 04/22/2013    Bacteremia assoc w L foot wound infection Mar 2015   . Gangrene of foot   . ESRD on hemodialysis   . Diabetes mellitus with peripheral vascular disease   . Hypothyroidism   . Decubitus ulcer of sacral region, stage 2 03/07/2014  . Glaucoma 03/07/2014  . Gangrene     right BKA  . Contracture of joint     left knee    Past Surgical History  Procedure Laterality Date  . Thyroidectomy    . Cervical  disc surgery    . Insertion of dialysis catheter Right 03/19/2012    Procedure: INSERTION OF DIALYSIS CATHETER;  Surgeon: Mal Misty, MD;  Location: [place];  Service: Vascular;  Laterality: Right;  Right Internal Jugular  . Av fistula placement Left 03/25/2012    Procedure: ARTERIOVENOUS (AV) FISTULA CREATION;  Surgeon: Mal Misty, MD;  Location: [place];  Service: Vascular;  Laterality: Left;  . Ligation of competing branches of arteriovenous fistula Left 05/08/2012    Procedure: LIGATION OF COMPETING BRANCHES OF ARTERIOVENOUS FISTULA;  Surgeon: Mal Misty, MD;  Location: [place];  Service: Vascular;  Laterality: Left;  Ultrasound guided  . Cardiac catheterization      approx 30 years ago  . Amputation Right 11/10/2012    Procedure: AMPUTATION FIRST and SECOND TOES Right Foot;  Surgeon: Elam Dutch, MD;  Location: [facility] OR;  Service: Vascular;  Laterality: Right;  . Transmetatarsal amputation Left 12/16/2012    Procedure: TRANSMETATARSAL AMPUTATION AND VAC PLACEMENT;  Surgeon: Elam Dutch, MD;  Location: [place];  Service: Vascular;  Laterality: Left;  . Amputation Left 04/07/2013    Procedure: AMPUTATION DIGIT- LEFT 1ST TOE;  Surgeon: Mal Misty, MD;  Location: [place];  Service: Vascular;  Laterality: Left;  . Tee without cardioversion N/A 04/20/2013    Procedure: TRANSESOPHAGEAL ECHOCARDIOGRAM (TEE);  Surgeon: Josue Hector, MD;  Location: [facility] ENDOSCOPY;  Service: Cardiovascular;  Laterality: N/A;  . I&d extremity Left 04/22/2013    Procedure: [id number] [id number] DEBRIDEMENT LEFT FIRST TOE AMPUTATION WOUND ;  Surgeon: Mal Misty, MD;  Location: [place];  Service: Vascular;  Laterality: Left;  . Amputation Left 04/26/2013    Procedure: Left Foot Transmetatarsal Amputation;  Surgeon: Newt Minion, MD;  Location: [place];  Service: Orthopedics;  Laterality: Left;  . Toe amputation      D/C 04-30-13  . Below knee leg amputation Left 05/14/2013    DR [name]  . Amputation Left 05/14/2013     Procedure: AMPUTATION BELOW KNEE;  Surgeon: Newt Minion, MD;  Location: [place];  Service: Orthopedics;  Laterality: Left;  Left Below Knee Amputation  . Eye surgery Bilateral     CATARACTS  . Amputation Left 07/23/2013    Procedure: AMPUTATION BELOW KNEE;  Surgeon: Newt Minion, MD;  Location: [place];  Service: Orthopedics;  Laterality: Left;  Left Below Knee Amputation Revision  . Abdominal aortagram Bilateral 11/06/2012    Procedure: ABDOMINAL AORTAGRAM;  Surgeon: Elam Dutch, MD;  Location: [facility] CATH LAB;  Service: Cardiovascular;  Laterality: Bilateral;  . I&d extremity Right 01/05/2014    Procedure: IRRIGATION AND DEBRIDEMENT Right Heel Ulcer;  Surgeon: Mcarthur Rossetti, MD;  Location: [place];  Service: Orthopedics;  Laterality: Right;  Surgeon Available after 5PM  . Below knee leg amputation Right 02/18/2014    dr duda  . Amputation Right 02/18/2014    Procedure: AMPUTATION BELOW KNEE;  Surgeon: Newt Minion, MD;  Location: [place];  Service: Orthopedics;  Laterality: Right;  . Stump revision Right 05/20/2014    Procedure: Revision Right Below Knee Amputation;  Surgeon: Newt Minion, MD;  Location: [place];  Service: Orthopedics;  Laterality: Right;  . Repair quadriceps/hamstring muscles Left 05/20/2014    Procedure: Left Hamstring Release;  Surgeon: Newt Minion, MD;  Location: [place];  Service: Orthopedics;  Laterality: Left;    There were no vitals filed for this visit.  Visit Diagnosis:  Weakness of both legs  Decreased functional activity tolerance  Activity intolerance  Decreased ROM of lower extremity  Decreased range of hip movement, unspecified laterality  Status post bilateral below knee amputation      Subjective Assessment - 09/01/14 0811    Subjective No new complaints. No pain or falls to report. Has been standing at sink at home with daughter, 30 seconds each stand.   Currently in Pain? No/denies     Treatment: Exercises Bridge 5 sec hold x 10  reps Straight leg raises x 10 reps each leg with assist for control and form Clam shell with red band,5 sec hold,10 reps Hip abduction in side lying with no resistance, x 10 reps each leg Seated passive hamstring and hip adductor stretching Supine passive stretching of bil IT bands and hip rotators  Ther-act  sit<>stand at sink x 1 rep with 2 person min>mod assist to stand and min assist to sit back down. Lateral weight shifting and anterior/posterior weight shifting performed while standing. Pt able to stand for 2-3 minutes.        [facility] Adult PT Treatment/Exercise - 09/02/14 0001    Prosthetics   Current prosthetic wear tolerance (days/week)  7 days /wk   Current prosthetic wear tolerance (#  hours/day)  3-4 hours total   Edema none noted   Residual limb condition  dry scaly skin, no open areas,    Education Provided Proper Donning;Proper Doffing;Proper wear schedule/adjustment;Care of non-amputated limb   Person(s) Educated Patient   Education Method Explanation   Education Method Verbalized understanding   Donning Prosthesis Minimal assist   Doffing Prosthesis Minimal assist            PT Short Term Goals - 08/18/14 1445    PT SHORT TERM GOAL #1   Title Patient donnes prostheses with cues only. (Target Date: 09/16/2014)   Time 1   Period Months   Status New   PT SHORT TERM GOAL #2   Title Patient tolerates bilateral prostheses wear >6hrs total per day without skin issues or limb pain.  (Target Date: 09/16/2014)   Time 1   Period Months   Status New   PT SHORT TERM GOAL #3   Title Squat-pivot transfer w/c to level mat modified independent with buttock clearance.  (Target Date: 09/16/2014)   Time 1   Period Months   Status New   PT SHORT TERM GOAL #4   Title Sit to / from stand w/c to parallel bars with moderate assist  (Target Date: 09/16/2014)   Time 1   Period Months   Status New   PT SHORT TERM GOAL #5   Title Patient ambulates 10' in parallel bars with prostheses  with max assist of 1 person with 2nd person supervising for safety.  (Target Date: 09/16/2014)   Time 1   Period Months   Status New           PT Long Term Goals - 08/18/14 1445    PT LONG TERM GOAL #1   Title Tolerates wear of prostheses >90% of awake hours without change in skin integrity nor undue tenderness.  (Target Date: 12/16/2014)   Time 4   Period Months   Status New   PT LONG TERM GOAL #2   Title Patient demonstrates / verbalizes proper prosthetic care for safe use of prostheses without issues.  (Target Date: 12/16/2014)   Time 4   Period Months   Status New   PT LONG TERM GOAL #3   Title Sit to /from stand and stand-pivot transfers with RW & prostheses with minimal assist.  (Target Date: 12/16/2014)   Time 4   Period Months   Status New   PT LONG TERM GOAL #4   Title Standing balance with RW & prostheses manages clothes & reaches 5" with supervision.  (Target Date: 12/16/2014)   Time 4   Period Months   Status New   PT LONG TERM GOAL #5   Title Patient ambulates 9' with RW & prostheses with minimal assist.  (Target Date: 12/16/2014)   Time 4   Period Months   Status New            Plan - 09/01/14 0812    Clinical Impression Statement Pt continues to have tightness with bil lower extremities, he reported feeling better after stretching today. Progressing his standing tolerance with increased consecutive time and less assitance needed today. Making progress toward goals.   Pt will benefit from skilled therapeutic intervention in order to improve on the following deficits Abnormal gait;Decreased activity tolerance;Decreased balance;Decreased endurance;Decreased knowledge of precautions;Decreased knowledge of use of DME;Decreased mobility;Decreased range of motion;Decreased strength;Postural dysfunction;Prosthetic Dependency   Rehab Potential Good   Clinical Impairments Affecting Rehab Potential Dialysis limits strength and endurance  with slow gains.    PT Frequency  2x / week   PT Duration Other (comment)  17 weeks (4 months - 120 days)   PT Treatment/Interventions ADLs/Self Care Home Management;DME Instruction;Gait training;Functional mobility training;Therapeutic activities;Therapeutic exercise;Balance training;Neuromuscular re-education;Patient/family education;Prosthetic Training;Passive range of motion   PT Next Visit Plan  Review prosthetic care. continue with standing in parallel bars and gait in parallel bars. continue with lower extremity strengthening.   Consulted and Agree with Plan of Care Patient;Family member/caregiver   Family Member Consulted dtr        Problem List Patient Active Problem List   Diagnosis Date Noted  . Complications, amputation stump late 05/20/2014  . Weakness generalized 04/07/2014  . Sepsis 04/07/2014  . Decubitus ulcer, stage II 04/07/2014  . Decubitus ulcer of ankle   . Fatigue   . Wound infection 04/06/2014  . Decubitus ulcer of sacral region, stage 2 03/07/2014  . Glaucoma 03/07/2014  . Gout 03/07/2014  . Below knee amputation status 02/18/2014  . Osteomyelitis 01/04/2014  . Phantom limb 12/14/2013  . Type 1 diabetes mellitus with diabetic foot infection   . Diabetes mellitus with peripheral vascular disease   . Asthma 05/17/2013  . History of MRSA infection 04/22/2013  . Peripheral vascular disease 11/19/2012  . End-stage renal disease on hemodialysis 05/05/2012  . Kahler disease 03/30/2012  . Anemia in chronic kidney disease 03/19/2012  . Hypothyroidism 03/19/2012  . Anemia, iron deficiency 03/19/2012  . Hypertension 03/18/2012  . Chronic kidney disease (CKD), stage V 03/18/2012  . Cardiac conduction disorder 03/18/2012  . MGUS (monoclonal gammopathy of unknown significance) 02/28/2011  . Monoclonal paraproteinemia 02/28/2011    Willow Ora 09/02/2014, 1:59 PM  Willow Ora, PTA, [facility] [address] [place], [place] [address] [phone] 09/02/2014,  1:59 PM

## 2014-09-06 ENCOUNTER — Ambulatory Visit: Payer: Medicare Other | Admitting: Physical Therapy

## 2014-09-06 ENCOUNTER — Encounter: Payer: Self-pay | Admitting: Physical Therapy

## 2014-09-06 DIAGNOSIS — Z89512 Acquired absence of left leg below knee: Secondary | ICD-10-CM

## 2014-09-06 DIAGNOSIS — M25669 Stiffness of unspecified knee, not elsewhere classified: Secondary | ICD-10-CM

## 2014-09-06 DIAGNOSIS — R6889 Other general symptoms and signs: Secondary | ICD-10-CM

## 2014-09-06 DIAGNOSIS — R2689 Other abnormalities of gait and mobility: Secondary | ICD-10-CM

## 2014-09-06 DIAGNOSIS — Z89511 Acquired absence of right leg below knee: Secondary | ICD-10-CM

## 2014-09-06 DIAGNOSIS — R269 Unspecified abnormalities of gait and mobility: Secondary | ICD-10-CM

## 2014-09-06 DIAGNOSIS — R2681 Unsteadiness on feet: Secondary | ICD-10-CM

## 2014-09-06 DIAGNOSIS — M24669 Ankylosis, unspecified knee: Secondary | ICD-10-CM

## 2014-09-06 DIAGNOSIS — R29898 Other symptoms and signs involving the musculoskeletal system: Secondary | ICD-10-CM

## 2014-09-07 NOTE — Therapy (Signed)
[facility] [address] [place], [place], [address] Phone: [phone]   Fax:  [phone]  Physical Therapy Treatment  Patient Details  Name: Jack [name] MRN: [id number] Date of Birth: 1945-12-09 Referring Provider:  Lucianne Lei, MD  Encounter Date: 09/06/2014      PT End of Session - 09/06/14 1538    Visit Number 6   Number of Visits 35   Date for PT Re-Evaluation 10/17/14   Authorization Type G-Code every 10 visits   PT Start Time 1532   PT Stop Time 1613   PT Time Calculation (min) 41 min   Equipment Utilized During Treatment Gait belt   Activity Tolerance Patient limited by fatigue;Patient tolerated treatment well   Behavior During Therapy [facility] for tasks assessed/performed      Past Medical History  Diagnosis Date  . Hypertension   . Pneumonia     2012  . Heart murmur   . Glaucoma   . Multiple myeloma, without mention of having achieved remission 03/30/2012    Cytogenetic neg on 03/23/2012.  . Asthma   . Hyperparathyroidism, secondary renal   . Peripheral arterial disease   . End-stage renal disease on hemodialysis     Started HD March 2014.  Cause of ESRD was DM.  Gets HD at [facility] on [place] on MWF schedule.   . Thyroid disease     hyperparathyroidism  . MRSA bacteremia   . Anemia   . Peripheral vascular disease, unspecified 11/19/2012    In the past had R foot toe amps then R TMA. In 2015 had left foot toe amp > then TMA >then L BKA on 05/14/13   . History of MRSA infection 04/22/2013    Bacteremia assoc w L foot wound infection Mar 2015   . Gangrene of foot   . ESRD on hemodialysis   . Diabetes mellitus with peripheral vascular disease   . Hypothyroidism   . Decubitus ulcer of sacral region, stage 2 03/07/2014  . Glaucoma 03/07/2014  . Gangrene     right BKA  . Contracture of joint     left knee    Past Surgical History  Procedure Laterality Date  . Thyroidectomy    . Cervical  disc surgery    . Insertion of dialysis catheter Right 03/19/2012    Procedure: INSERTION OF DIALYSIS CATHETER;  Surgeon: Mal Misty, MD;  Location: [place];  Service: Vascular;  Laterality: Right;  Right Internal Jugular  . Av fistula placement Left 03/25/2012    Procedure: ARTERIOVENOUS (AV) FISTULA CREATION;  Surgeon: Mal Misty, MD;  Location: [place];  Service: Vascular;  Laterality: Left;  . Ligation of competing branches of arteriovenous fistula Left 05/08/2012    Procedure: LIGATION OF COMPETING BRANCHES OF ARTERIOVENOUS FISTULA;  Surgeon: Mal Misty, MD;  Location: [place];  Service: Vascular;  Laterality: Left;  Ultrasound guided  . Cardiac catheterization      approx 30 years ago  . Amputation Right 11/10/2012    Procedure: AMPUTATION FIRST and SECOND TOES Right Foot;  Surgeon: Elam Dutch, MD;  [facility] OR;  Service: Vascular;  Laterality: Right;  . Transmetatarsal amputation Left 12/16/2012    Procedure: TRANSMETATARSAL AMPUTATION AND VAC PLACEMENT;  Surgeon: Elam Dutch, MD;  Location: [place];  Service: Vascular;  Laterality: Left;  . Amputation Left 04/07/2013    Procedure: AMPUTATION DIGIT- LEFT 1ST TOE;  Surgeon: Mal Misty, MD;  Location: [place];  Service: Vascular;  Laterality: Left;  . Tee without cardioversion N/A 04/20/2013    Procedure: TRANSESOPHAGEAL ECHOCARDIOGRAM (TEE);  Surgeon: Josue Hector, MD;  Location: [facility] ENDOSCOPY;  Service: Cardiovascular;  Laterality: N/A;  . I&d extremity Left 04/22/2013    Procedure: [id number] MBW DEBRIDEMENT LEFT FIRST TOE AMPUTATION WOUND ;  Surgeon: Mal Misty, MD;  Location: [place];  Service: Vascular;  Laterality: Left;  . Amputation Left 04/26/2013    Procedure: Left Foot Transmetatarsal Amputation;  Surgeon: Newt Minion, MD;  Location: [place];  Service: Orthopedics;  Laterality: Left;  . Toe amputation      D/C 04-30-13  . Below knee leg amputation Left 05/14/2013    DR [name]  . Amputation Left 05/14/2013     Procedure: AMPUTATION BELOW KNEE;  Surgeon: Newt Minion, MD;  Location: [facility] [place] [facility];  Service: Orthopedics;  Laterality: Left;  Left Below Knee Amputation  . Eye surgery Bilateral     CATARACTS  . Amputation Left 07/23/2013    Procedure: AMPUTATION BELOW KNEE;  Surgeon: Newt Minion, MD;  Location: [place];  Service: Orthopedics;  Laterality: Left;  Left Below Knee Amputation Revision  . Abdominal aortagram Bilateral 11/06/2012    Procedure: ABDOMINAL AORTAGRAM;  Surgeon: Elam Dutch, MD;  Location: [facility] CATH LAB;  Service: Cardiovascular;  Laterality: Bilateral;  . I&d extremity Right 01/05/2014    Procedure: IRRIGATION AND DEBRIDEMENT Right Heel Ulcer;  Surgeon: Mcarthur Rossetti, MD;  Location: [place];  Service: Orthopedics;  Laterality: Right;  Surgeon Available after 5PM  . Below knee leg amputation Right 02/18/2014    dr duda  . Amputation Right 02/18/2014    Procedure: AMPUTATION BELOW KNEE;  Surgeon: Newt Minion, MD;  Location: [place];  Service: Orthopedics;  Laterality: Right;  . Stump revision Right 05/20/2014    Procedure: Revision Right Below Knee Amputation;  Surgeon: Newt Minion, MD;  Location: [place];  Service: Orthopedics;  Laterality: Right;  . Repair quadriceps/hamstring muscles Left 05/20/2014    Procedure: Left Hamstring Release;  Surgeon: Newt Minion, MD;  Location: [place];  Service: Orthopedics;  Laterality: Left;    There were no vitals filed for this visit.  Visit Diagnosis:  Weakness of both legs  Decreased functional activity tolerance  Activity intolerance  Decreased ROM of lower extremity  Balance problems  Abnormality of gait  Status post bilateral below knee amputation  Decreased range of knee movement, unspecified laterality  Unsteadiness      Subjective Assessment - 09/06/14 1536    Subjective No new complaints. No pain or falls to report. Has been standing at sink at home with daughter, 30 seconds each stand, when he can. Had trouble getting up  yesterday  due to exercising all morning and too tired.   Currently in Pain? No/denies            [facility] Adult PT Treatment/Exercise - 09/06/14 1539    Transfers   Sit to Stand 3: Mod assist   Stand to Sit 3: Mod assist   Ambulation/Gait   Ambulation/Gait Yes   Ambulation/Gait Assistance 3: Mod assist   Ambulation Distance (Feet) 5 Feet  x2, 8 feet x 2 reps   Assistive device Parallel bars;Prostheses   Gait Pattern Step-to pattern;Decreased step length - right;Decreased step length - left;Decreased stride length;Decreased hip/knee flexion - right;Decreased hip/knee flexion - left;Right flexed knee in stance;Left flexed knee in stance;Lateral hip instability;Trunk flexed;Poor foot clearance - left;Poor foot clearance -  right   Ambulation Surface Level;Indoor   Prosthetics   Prosthetic Care Comments  Pt to increase wear time to 6 hours, 3 hours 2x day with 1-2 hour break between.   Current prosthetic wear tolerance (days/week)  7 days /wk   Current prosthetic wear tolerance (#hours/day)  3-4 hours total   Residual limb condition  dry scaly skin, no open areas,    Education Provided Proper wear schedule/adjustment;Proper weight-bearing schedule/adjustment;Residual limb care   Person(s) Educated Patient   Education Method Explanation;Verbal cues   Education Method Verbalized understanding   Donning Prosthesis Minimal assist   Doffing Prosthesis Minimal assist     Treatment: Exercises Bridge 5 sec hold x 10 reps Heel slide for knee extension with gentle overpressure to increase knee extension stretch x 10 rep each side  Educated with therapist demo wall leg press simulation. Pt to add this to his HEP.          PT Short Term Goals - 08/18/14 1445    PT SHORT TERM GOAL #1   Title Patient donnes prostheses with cues only. (Target Date: 09/16/2014)   Time 1   Period Months   Status New   PT SHORT TERM GOAL #2   Title Patient tolerates bilateral prostheses wear >6hrs total per  day without skin issues or limb pain.  (Target Date: 09/16/2014)   Time 1   Period Months   Status New   PT SHORT TERM GOAL #3   Title Squat-pivot transfer w/c to level mat modified independent with buttock clearance.  (Target Date: 09/16/2014)   Time 1   Period Months   Status New   PT SHORT TERM GOAL #4   Title Sit to / from stand w/c to parallel bars with moderate assist  (Target Date: 09/16/2014)   Time 1   Period Months   Status New   PT SHORT TERM GOAL #5   Title Patient ambulates 10' in parallel bars with prostheses with max assist of 1 person with 2nd person supervising for safety.  (Target Date: 09/16/2014)   Time 1   Period Months   Status New           PT Long Term Goals - 08/18/14 1445    PT LONG TERM GOAL #1   Title Tolerates wear of prostheses >90% of awake hours without change in skin integrity nor undue tenderness.  (Target Date: 12/16/2014)   Time 4   Period Months   Status New   PT LONG TERM GOAL #2   Title Patient demonstrates / verbalizes proper prosthetic care for safe use of prostheses without issues.  (Target Date: 12/16/2014)   Time 4   Period Months   Status New   PT LONG TERM GOAL #3   Title Sit to /from stand and stand-pivot transfers with RW & prostheses with minimal assist.  (Target Date: 12/16/2014)   Time 4   Period Months   Status New   PT LONG TERM GOAL #4   Title Standing balance with RW & prostheses manages clothes & reaches 5" with supervision.  (Target Date: 12/16/2014)   Time 4   Period Months   Status New   PT LONG TERM GOAL #5   Title Patient ambulates 39' with RW & prostheses with minimal assist.  (Target Date: 12/16/2014)   Time 4   Period Months   Status New            Plan - 09/06/14 1538    Clinical Impression  Statement Pt making steady progress with increased standing times and increased overall gait distance in parallel bars. Pt progressing well towards goals.   Pt will benefit from skilled therapeutic intervention in order  to improve on the following deficits Abnormal gait;Decreased activity tolerance;Decreased balance;Decreased endurance;Decreased knowledge of precautions;Decreased knowledge of use of DME;Decreased mobility;Decreased range of motion;Decreased strength;Postural dysfunction;Prosthetic Dependency   Rehab Potential Good   Clinical Impairments Affecting Rehab Potential Dialysis limits strength and endurance with slow gains.    PT Frequency 2x / week   PT Duration Other (comment)  17 weeks (4 months - 120 days)   PT Treatment/Interventions ADLs/Self Care Home Management;DME Instruction;Gait training;Functional mobility training;Therapeutic activities;Therapeutic exercise;Balance training;Neuromuscular re-education;Patient/family education;Prosthetic Training;Passive range of motion   PT Next Visit Plan  Review prosthetic care. continue with standing in parallel bars and gait in parallel bars. continue with lower extremity strengthening.   Consulted and Agree with Plan of Care Patient;Family member/caregiver   Family Member Consulted dtr        Problem List Patient Active Problem List   Diagnosis Date Noted  . Complications, amputation stump late 05/20/2014  . Weakness generalized 04/07/2014  . Sepsis 04/07/2014  . Decubitus ulcer, stage II 04/07/2014  . Decubitus ulcer of ankle   . Fatigue   . Wound infection 04/06/2014  . Decubitus ulcer of sacral region, stage 2 03/07/2014  . Glaucoma 03/07/2014  . Gout 03/07/2014  . Below knee amputation status 02/18/2014  . Osteomyelitis 01/04/2014  . Phantom limb 12/14/2013  . Type 1 diabetes mellitus with diabetic foot infection   . Diabetes mellitus with peripheral vascular disease   . Asthma 05/17/2013  . History of MRSA infection 04/22/2013  . Peripheral vascular disease 11/19/2012  . End-stage renal disease on hemodialysis 05/05/2012  . Kahler disease 03/30/2012  . Anemia in chronic kidney disease 03/19/2012  . Hypothyroidism 03/19/2012  .  Anemia, iron deficiency 03/19/2012  . Hypertension 03/18/2012  . Chronic kidney disease (CKD), stage V 03/18/2012  . Cardiac conduction disorder 03/18/2012  . MGUS (monoclonal gammopathy of unknown significance) 02/28/2011  . Monoclonal paraproteinemia 02/28/2011    Willow Ora 09/07/2014, 12:23 PM  Willow Ora, PTA, [place] [address], [place], [place] [address] [phone] 09/07/2014, 12:23 PM

## 2014-09-08 ENCOUNTER — Encounter: Payer: Self-pay | Admitting: Physical Therapy

## 2014-09-08 ENCOUNTER — Ambulatory Visit: Payer: Medicare Other | Admitting: Physical Therapy

## 2014-09-08 DIAGNOSIS — Z89512 Acquired absence of left leg below knee: Secondary | ICD-10-CM

## 2014-09-08 DIAGNOSIS — R29898 Other symptoms and signs involving the musculoskeletal system: Secondary | ICD-10-CM

## 2014-09-08 DIAGNOSIS — M25669 Stiffness of unspecified knee, not elsewhere classified: Secondary | ICD-10-CM

## 2014-09-08 DIAGNOSIS — R6889 Other general symptoms and signs: Secondary | ICD-10-CM

## 2014-09-08 DIAGNOSIS — R2689 Other abnormalities of gait and mobility: Secondary | ICD-10-CM

## 2014-09-08 DIAGNOSIS — R269 Unspecified abnormalities of gait and mobility: Secondary | ICD-10-CM

## 2014-09-08 DIAGNOSIS — Z89511 Acquired absence of right leg below knee: Secondary | ICD-10-CM

## 2014-09-08 DIAGNOSIS — R2681 Unsteadiness on feet: Secondary | ICD-10-CM

## 2014-09-08 NOTE — Therapy (Signed)
[facility] [address] [place], [place], [address] Phone: [phone]   Fax:  [phone]  Physical Therapy Treatment  Patient Details  Name: Jack [name] MRN: [id number] Date of Birth: April 11, 1945 Referring Provider:  Lucianne Lei, MD  Encounter Date: 09/08/2014      PT End of Session - 09/08/14 1100    Visit Number 7   Number of Visits 35   Date for PT Re-Evaluation 10/17/14   Authorization Type G-Code every 10 visits   PT Start Time 1115   PT Stop Time 1159   PT Time Calculation (min) 44 min   Equipment Utilized During Treatment Gait belt   Activity Tolerance Patient limited by fatigue;Patient tolerated treatment well   Behavior During Therapy [facility] for tasks assessed/performed      Past Medical History  Diagnosis Date  . Hypertension   . Pneumonia     2012  . Heart murmur   . Glaucoma   . Multiple myeloma, without mention of having achieved remission 03/30/2012    Cytogenetic neg on 03/23/2012.  . Asthma   . Hyperparathyroidism, secondary renal   . Peripheral arterial disease   . End-stage renal disease on hemodialysis     Started HD March 2014.  Cause of ESRD was DM.  Gets HD at [facility] on [address] on MWF schedule.   . Thyroid disease     hyperparathyroidism  . MRSA bacteremia   . Anemia   . Peripheral vascular disease, unspecified 11/19/2012    In the past had R foot toe amps then R TMA. In 2015 had left foot toe amp > then TMA >then L BKA on 05/14/13   . History of MRSA infection 04/22/2013    Bacteremia assoc w L foot wound infection Mar 2015   . Gangrene of foot   . ESRD on hemodialysis   . Diabetes mellitus with peripheral vascular disease   . Hypothyroidism   . Decubitus ulcer of sacral region, stage 2 03/07/2014  . Glaucoma 03/07/2014  . Gangrene     right BKA  . Contracture of joint     left knee    Past Surgical History  Procedure Laterality Date  . Thyroidectomy    . Cervical  disc surgery    . Insertion of dialysis catheter Right 03/19/2012    Procedure: INSERTION OF DIALYSIS CATHETER;  Surgeon: Mal Misty, MD;  Location: [place];  Service: Vascular;  Laterality: Right;  Right Internal Jugular  . Av fistula placement Left 03/25/2012    Procedure: ARTERIOVENOUS (AV) FISTULA CREATION;  Surgeon: Mal Misty, MD;  Location: [place];  Service: Vascular;  Laterality: Left;  . Ligation of competing branches of arteriovenous fistula Left 05/08/2012    Procedure: LIGATION OF COMPETING BRANCHES OF ARTERIOVENOUS FISTULA;  Surgeon: Mal Misty, MD;  Location: [place];  Service: Vascular;  Laterality: Left;  Ultrasound guided  . Cardiac catheterization      approx 30 years ago  . Amputation Right 11/10/2012    Procedure: AMPUTATION FIRST and SECOND TOES Right Foot;  Surgeon: Elam Dutch, MD;  Location: [facility] OR;  Service: Vascular;  Laterality: Right;  . Transmetatarsal amputation Left 12/16/2012    Procedure: TRANSMETATARSAL AMPUTATION AND VAC PLACEMENT;  Surgeon: Elam Dutch, MD;  Location: [place];  Service: Vascular;  Laterality: Left;  . Amputation Left 04/07/2013    Procedure: AMPUTATION DIGIT- LEFT 1ST TOE;  Surgeon: Mal Misty, MD;  Location: [place];  Service: Vascular;  Laterality: Left;  . Tee without cardioversion N/A 04/20/2013    Procedure: TRANSESOPHAGEAL ECHOCARDIOGRAM (TEE);  Surgeon: Josue Hector, MD;  Location: [facility] ENDOSCOPY;  Service: Cardiovascular;  Laterality: N/A;  . I&d extremity Left 04/22/2013    Procedure: [id number] NTZ DEBRIDEMENT LEFT FIRST TOE AMPUTATION WOUND ;  Surgeon: Mal Misty, MD;  Location: [place];  Service: Vascular;  Laterality: Left;  . Amputation Left 04/26/2013    Procedure: Left Foot Transmetatarsal Amputation;  Surgeon: Newt Minion, MD;  Location: [place];  Service: Orthopedics;  Laterality: Left;  . Toe amputation      D/C 04-30-13  . Below knee leg amputation Left 05/14/2013    DR [name]  . Amputation Left 05/14/2013     Procedure: AMPUTATION BELOW KNEE;  Surgeon: Newt Minion, MD;  Location: [place];  Service: Orthopedics;  Laterality: Left;  Left Below Knee Amputation  . Eye surgery Bilateral     CATARACTS  . Amputation Left 07/23/2013    Procedure: AMPUTATION BELOW KNEE;  Surgeon: Newt Minion, MD;  Location: [place];  Service: Orthopedics;  Laterality: Left;  Left Below Knee Amputation Revision  . Abdominal aortagram Bilateral 11/06/2012    Procedure: ABDOMINAL AORTAGRAM;  Surgeon: Elam Dutch, MD;  Location: [facility] CATH LAB;  Service: Cardiovascular;  Laterality: Bilateral;  . I&d extremity Right 01/05/2014    Procedure: IRRIGATION AND DEBRIDEMENT Right Heel Ulcer;  Surgeon: Mcarthur Rossetti, MD;  Location: [place];  Service: Orthopedics;  Laterality: Right;  Surgeon Available after 5PM  . Below knee leg amputation Right 02/18/2014    dr duda  . Amputation Right 02/18/2014    Procedure: AMPUTATION BELOW KNEE;  Surgeon: Newt Minion, MD;  Location: [place];  Service: Orthopedics;  Laterality: Right;  . Stump revision Right 05/20/2014    Procedure: Revision Right Below Knee Amputation;  Surgeon: Newt Minion, MD;  Location: [place];  Service: Orthopedics;  Laterality: Right;  . Repair quadriceps/hamstring muscles Left 05/20/2014    Procedure: Left Hamstring Release;  Surgeon: Newt Minion, MD;  Location: [place];  Service: Orthopedics;  Laterality: Left;    There were no vitals filed for this visit.  Visit Diagnosis:  Weakness of both legs  Decreased functional activity tolerance  Decreased ROM of lower extremity  Balance problems  Abnormality of gait  Status post bilateral below knee amputation  Unsteadiness                       [facility] Adult PT Treatment/Exercise - 09/08/14 1100    Transfers   Sit to Stand 3: Mod assist;4: Min assist;With upper extremity assist;With armrests;From chair/3-in-1  modA initially in parallel bars to [place], [place] to [employer] to Stand Details Visual  cues/gestures for sequencing;Verbal cues for sequencing;Verbal cues for technique   Sit to Stand Details (indicate cue type and reason) in parallel bars then RW   Stand to Sit 4: Min assist;With upper extremity assist;With armrests;To chair/3-in-1   Stand to Sit Details (indicate cue type and reason) Verbal cues for sequencing;Verbal cues for technique;Visual cues/gestures for sequencing   Stand to Sit Details in parallel bars then RW   Ambulation/Gait   Ambulation/Gait Yes   Ambulation/Gait Assistance 3: Mod assist   Ambulation/Gait Assistance Details manual, verbal cues on erecting trunk, position of RW / pt, knee extension   Ambulation Distance (Feet) 5 Feet  5' X 2 in parallel bars, 5' &  5' with RW   Assistive device Parallel bars;Prostheses;Rolling walker   Gait Pattern Step-to pattern;Decreased step length - right;Decreased step length - left;Decreased stride length;Decreased hip/knee flexion - right;Decreased hip/knee flexion - left;Right flexed knee in stance;Left flexed knee in stance;Lateral hip instability;Trunk flexed;Poor foot clearance - left;Poor foot clearance - right   Ambulation Surface Indoor;Level   Prosthetics   Prosthetic Care Comments  PT instructed pt/dtr in weighing & wearing prostheses at dialysis,    Current prosthetic wear tolerance (days/week)  7 days /wk   Current prosthetic wear tolerance (#hours/day)  4hrs 2x/day for last week, PT increased to 5hr 2x/day with inner liner only at dialysis this week until he tolerates 6 hrs wear.   Residual limb condition  dry scaly skin, no open areas,    Education Provided Proper wear schedule/adjustment;Proper weight-bearing schedule/adjustment;Residual limb care;Skin check;Correct ply sock adjustment;Proper Donning  weigh & wear at dialysis   Person(s) Educated Patient;Child(ren)   Education Method Explanation;Demonstration;Tactile cues;Verbal cues   Education Method Verbalized understanding;Returned demonstration;Tactile  cues required;Verbal cues required;Needs further instruction   Donning Prosthesis Minimal assist   Doffing Prosthesis Supervision                  PT Short Term Goals - 08/18/14 1445    PT SHORT TERM GOAL #1   Title Patient donnes prostheses with cues only. (Target Date: 09/16/2014)   Time 1   Period Months   Status New   PT SHORT TERM GOAL #2   Title Patient tolerates bilateral prostheses wear >6hrs total per day without skin issues or limb pain.  (Target Date: 09/16/2014)   Time 1   Period Months   Status New   PT SHORT TERM GOAL #3   Title Squat-pivot transfer w/c to level mat modified independent with buttock clearance.  (Target Date: 09/16/2014)   Time 1   Period Months   Status New   PT SHORT TERM GOAL #4   Title Sit to / from stand w/c to parallel bars with moderate assist  (Target Date: 09/16/2014)   Time 1   Period Months   Status New   PT SHORT TERM GOAL #5   Title Patient ambulates 10' in parallel bars with prostheses with max assist of 1 person with 2nd person supervising for safety.  (Target Date: 09/16/2014)   Time 1   Period Months   Status New           PT Long Term Goals - 08/18/14 1445    PT LONG TERM GOAL #1   Title Tolerates wear of prostheses >90% of awake hours without change in skin integrity nor undue tenderness.  (Target Date: 12/16/2014)   Time 4   Period Months   Status New   PT LONG TERM GOAL #2   Title Patient demonstrates / verbalizes proper prosthetic care for safe use of prostheses without issues.  (Target Date: 12/16/2014)   Time 4   Period Months   Status New   PT LONG TERM GOAL #3   Title Sit to /from stand and stand-pivot transfers with RW & prostheses with minimal assist.  (Target Date: 12/16/2014)   Time 4   Period Months   Status New   PT LONG TERM GOAL #4   Title Standing balance with RW & prostheses manages clothes & reaches 5" with supervision.  (Target Date: 12/16/2014)   Time 4   Period Months   Status New   PT LONG  TERM GOAL #5  Title Patient ambulates 61' with RW & prostheses with minimal assist.  (Target Date: 12/16/2014)   Time 4   Period Months   Status New               Plan - 09/08/14 1100    Clinical Impression Statement Patient appears to understand how to weigh prostheses for dialysis and incorporate into dialysis without changing dry weight. Patient improved sit to stand with instruction in technique. Patient was able to ambulate outside of parallel bars with RW for first time.   Pt will benefit from skilled therapeutic intervention in order to improve on the following deficits Abnormal gait;Decreased activity tolerance;Decreased balance;Decreased endurance;Decreased knowledge of precautions;Decreased knowledge of use of DME;Decreased mobility;Decreased range of motion;Decreased strength;Postural dysfunction;Prosthetic Dependency   Rehab Potential Good   Clinical Impairments Affecting Rehab Potential Dialysis limits strength and endurance with slow gains.    PT Frequency 2x / week   PT Duration Other (comment)  17 weeks (4 months - 120 days)   PT Treatment/Interventions ADLs/Self Care Home Management;DME Instruction;Gait training;Functional mobility training;Therapeutic activities;Therapeutic exercise;Balance training;Neuromuscular re-education;Patient/family education;Prosthetic Training;Passive range of motion   PT Next Visit Plan  Assess STGs. Review prosthetic care. continue with standing in parallel bars and gait in parallel bars & RW. continue with lower extremity strengthening.   Consulted and Agree with Plan of Care Patient;Family member/caregiver   Family Member Consulted dtr        Problem List Patient Active Problem List   Diagnosis Date Noted  . Complications, amputation stump late 05/20/2014  . Weakness generalized 04/07/2014  . Sepsis 04/07/2014  . Decubitus ulcer, stage II 04/07/2014  . Decubitus ulcer of ankle   . Fatigue   . Wound infection 04/06/2014  .  Decubitus ulcer of sacral region, stage 2 03/07/2014  . Glaucoma 03/07/2014  . Gout 03/07/2014  . Below knee amputation status 02/18/2014  . Osteomyelitis 01/04/2014  . Phantom limb 12/14/2013  . Type 1 diabetes mellitus with diabetic foot infection   . Diabetes mellitus with peripheral vascular disease   . Asthma 05/17/2013  . History of MRSA infection 04/22/2013  . Peripheral vascular disease 11/19/2012  . End-stage renal disease on hemodialysis 05/05/2012  . Kahler disease 03/30/2012  . Anemia in chronic kidney disease 03/19/2012  . Hypothyroidism 03/19/2012  . Anemia, iron deficiency 03/19/2012  . Hypertension 03/18/2012  . Chronic kidney disease (CKD), stage V 03/18/2012  . Cardiac conduction disorder 03/18/2012  . MGUS (monoclonal gammopathy of unknown significance) 02/28/2011  . Monoclonal paraproteinemia 02/28/2011    Jamey Reas  PT, DPT  09/08/2014, 1:07 PM  [facility] [address] [place], [place], [address] Phone: [phone]   Fax:  [phone]

## 2014-09-13 ENCOUNTER — Ambulatory Visit: Payer: Medicare Other | Admitting: Physical Therapy

## 2014-09-13 ENCOUNTER — Encounter: Payer: Self-pay | Admitting: Physical Therapy

## 2014-09-13 DIAGNOSIS — Z89512 Acquired absence of left leg below knee: Secondary | ICD-10-CM

## 2014-09-13 DIAGNOSIS — R6889 Other general symptoms and signs: Secondary | ICD-10-CM

## 2014-09-13 DIAGNOSIS — M25669 Stiffness of unspecified knee, not elsewhere classified: Secondary | ICD-10-CM

## 2014-09-13 DIAGNOSIS — Z89511 Acquired absence of right leg below knee: Secondary | ICD-10-CM

## 2014-09-13 DIAGNOSIS — Z4789 Encounter for other orthopedic aftercare: Secondary | ICD-10-CM

## 2014-09-13 DIAGNOSIS — R269 Unspecified abnormalities of gait and mobility: Secondary | ICD-10-CM | POA: Diagnosis not present

## 2014-09-13 DIAGNOSIS — R29898 Other symptoms and signs involving the musculoskeletal system: Secondary | ICD-10-CM

## 2014-09-13 DIAGNOSIS — R2681 Unsteadiness on feet: Secondary | ICD-10-CM

## 2014-09-13 DIAGNOSIS — R2689 Other abnormalities of gait and mobility: Secondary | ICD-10-CM

## 2014-09-13 NOTE — Therapy (Signed)
[facility] [address] [place], [place], [address] Phone: [phone]   Fax:  [phone]  Physical Therapy Treatment  Patient Details  Name: Jack [name] MRN: [id number] Date of Birth: 02/07/1945 Referring Provider:  Lucianne Lei, MD  Encounter Date: 09/13/2014      PT End of Session - 09/13/14 0845    Visit Number 8   Number of Visits 35   Date for PT Re-Evaluation 10/17/14   Authorization Type G-Code every 10 visits   PT Start Time 0845   PT Stop Time 0930   PT Time Calculation (min) 45 min   Equipment Utilized During Treatment Gait belt   Activity Tolerance Patient limited by fatigue;Patient tolerated treatment well   Behavior During Therapy [facility] for tasks assessed/performed      Past Medical History  Diagnosis Date  . Hypertension   . Pneumonia     2012  . Heart murmur   . Glaucoma   . Multiple myeloma, without mention of having achieved remission 03/30/2012    Cytogenetic neg on 03/23/2012.  . Asthma   . Hyperparathyroidism, secondary renal   . Peripheral arterial disease   . End-stage renal disease on hemodialysis     Started HD March 2014.  Cause of ESRD was DM.  Gets HD at [facility] on [place] on MWF schedule.   . Thyroid disease     hyperparathyroidism  . MRSA bacteremia   . Anemia   . Peripheral vascular disease, unspecified 11/19/2012    In the past had R foot toe amps then R TMA. In 2015 had left foot toe amp > then TMA >then L BKA on 05/14/13   . History of MRSA infection 04/22/2013    Bacteremia assoc w L foot wound infection Mar 2015   . Gangrene of foot   . ESRD on hemodialysis   . Diabetes mellitus with peripheral vascular disease   . Hypothyroidism   . Decubitus ulcer of sacral region, stage 2 03/07/2014  . Glaucoma 03/07/2014  . Gangrene     right BKA  . Contracture of joint     left knee    Past Surgical History  Procedure Laterality Date  . Thyroidectomy    . Cervical  disc surgery    . Insertion of dialysis catheter Right 03/19/2012    Procedure: INSERTION OF DIALYSIS CATHETER;  Surgeon: Mal Misty, MD;  Location: [place];  Service: Vascular;  Laterality: Right;  Right Internal Jugular  . Av fistula placement Left 03/25/2012    Procedure: ARTERIOVENOUS (AV) FISTULA CREATION;  Surgeon: Mal Misty, MD;  Location: [place];  Service: Vascular;  Laterality: Left;  . Ligation of competing branches of arteriovenous fistula Left 05/08/2012    Procedure: LIGATION OF COMPETING BRANCHES OF ARTERIOVENOUS FISTULA;  Surgeon: Mal Misty, MD;  Location: [place];  Service: Vascular;  Laterality: Left;  Ultrasound guided  . Cardiac catheterization      approx 30 years ago  . Amputation Right 11/10/2012    Procedure: AMPUTATION FIRST and SECOND TOES Right Foot;  Surgeon: Elam Dutch, MD;  Location: [facility] OR;  Service: Vascular;  Laterality: Right;  . Transmetatarsal amputation Left 12/16/2012    Procedure: TRANSMETATARSAL AMPUTATION AND VAC PLACEMENT;  Surgeon: Elam Dutch, MD;  Location: [place];  Service: Vascular;  Laterality: Left;  . Amputation Left 04/07/2013    Procedure: AMPUTATION DIGIT- LEFT 1ST TOE;  Surgeon: Mal Misty, MD;  Location: [place];  Service: Vascular;  Laterality: Left;  . Tee without cardioversion N/A 04/20/2013    Procedure: TRANSESOPHAGEAL ECHOCARDIOGRAM (TEE);  Surgeon: Josue Hector, MD;  Location: [facility] ENDOSCOPY;  Service: Cardiovascular;  Laterality: N/A;  . I&d extremity Left 04/22/2013    Procedure: AJOINOMVEH MCN DEBRIDEMENT LEFT FIRST TOE AMPUTATION WOUND ;  Surgeon: Mal Misty, MD;  Location: [place];  Service: Vascular;  Laterality: Left;  . Amputation Left 04/26/2013    Procedure: Left Foot Transmetatarsal Amputation;  Surgeon: Newt Minion, MD;  Location: [place];  Service: Orthopedics;  Laterality: Left;  . Toe amputation      D/C 04-30-13  . Below knee leg amputation Left 05/14/2013    DR [name]  . Amputation Left 05/14/2013     Procedure: AMPUTATION BELOW KNEE;  Surgeon: Newt Minion, MD;  Location: [place];  Service: Orthopedics;  Laterality: Left;  Left Below Knee Amputation  . Eye surgery Bilateral     CATARACTS  . Amputation Left 07/23/2013    Procedure: AMPUTATION BELOW KNEE;  Surgeon: Newt Minion, MD;  Location: [place];  Service: Orthopedics;  Laterality: Left;  Left Below Knee Amputation Revision  . Abdominal aortagram Bilateral 11/06/2012    Procedure: ABDOMINAL AORTAGRAM;  Surgeon: Elam Dutch, MD;  Location: [facility] CATH LAB;  Service: Cardiovascular;  Laterality: Bilateral;  . I&d extremity Right 01/05/2014    Procedure: IRRIGATION AND DEBRIDEMENT Right Heel Ulcer;  Surgeon: Mcarthur Rossetti, MD;  Location: [place];  Service: Orthopedics;  Laterality: Right;  Surgeon Available after 5PM  . Below knee leg amputation Right 02/18/2014    dr duda  . Amputation Right 02/18/2014    Procedure: AMPUTATION BELOW KNEE;  Surgeon: Newt Minion, MD;  Location: [place];  Service: Orthopedics;  Laterality: Right;  . Stump revision Right 05/20/2014    Procedure: Revision Right Below Knee Amputation;  Surgeon: Newt Minion, MD;  Location: [place];  Service: Orthopedics;  Laterality: Right;  . Repair quadriceps/hamstring muscles Left 05/20/2014    Procedure: Left Hamstring Release;  Surgeon: Newt Minion, MD;  Location: [place];  Service: Orthopedics;  Laterality: Left;    There were no vitals filed for this visit.  Visit Diagnosis:  Weakness of both legs  Decreased functional activity tolerance  Decreased ROM of lower extremity  Balance problems  Abnormality of gait  Status post bilateral below knee amputation  Unsteadiness  Encounter for prosthetic gait training      Subjective Assessment - 09/13/14 0852    Subjective Wearing prostheses 5hrs but only 1x/day. No issues.   Currently in Pain? No/denies                         [facility] Adult PT Treatment/Exercise - 09/13/14 0845    Transfers    Sit to Stand 4: Min assist;With upper extremity assist;With armrests;From chair/3-in-1  to RW   Sit to Stand Details Visual cues/gestures for sequencing;Verbal cues for sequencing;Verbal cues for technique   Sit to Stand Details (indicate cue type and reason) PT verbally cued wt shift & technique, set-up to work at sink at home   Stand to Sit 4: Min assist;With upper extremity assist;With armrests;To chair/3-in-1  from RW   Stand to Sit Details (indicate cue type and reason) Verbal cues for sequencing;Verbal cues for technique;Visual cues/gestures for sequencing   Ambulation/Gait   Ambulation/Gait Yes   Ambulation/Gait Assistance 2: Max assist  2 people for safety  Ambulation/Gait Assistance Details manual, verbal cues on posture, LE extension in stance and sequence   Ambulation Distance (Feet) 23 Feet  21' & 28' with RW   Assistive device Prostheses;Rolling walker   Gait Pattern Step-to pattern;Decreased step length - right;Decreased step length - left;Decreased stride length;Decreased hip/knee flexion - right;Decreased hip/knee flexion - left;Right flexed knee in stance;Left flexed knee in stance;Lateral hip instability;Trunk flexed;Poor foot clearance - left;Poor foot clearance - right   Ambulation Surface Indoor;Level   Prosthetics   Prosthetic Care Comments  PT instructed pt/dtr in weighing & wearing prostheses at dialysis,    Current prosthetic wear tolerance (days/week)  7 days /wk   Current prosthetic wear tolerance (#hours/day)  PT increased to 5hr 2x/day with inner liner only at dialysis this week until he tolerates 6 hrs wear.   Residual limb condition  dry scaly skin, no open areas,    Education Provided Proper wear schedule/adjustment;Proper weight-bearing schedule/adjustment;Residual limb care;Skin check;Correct ply sock adjustment;Proper Donning  weigh & wear at dialysis   Person(s) Educated Patient;Child(ren)   Education Method Explanation;Demonstration;Verbal cues    Education Method Tactile cues required;Verbal cues required;Needs further instruction;Returned demonstration;Verbalized understanding   Donning Prosthesis Minimal assist   Doffing Prosthesis Supervision        Therapeutic Exercise: propelling w/c forward with LE flexion and pushing w/c backward with LE extension 20' each on tile floor with minA occasionally.  Seated with LEs against wall alternating leg press 10 reps.         PT Education - 09/13/14 0845    Education provided Yes   Education Details standing at sink, propelling w/c with LEs to improve flexion & extension, seated closed chain leg press with feet on wall   Person(s) Educated Patient;Child(ren)   Methods Explanation;Demonstration;Verbal cues   Comprehension Verbalized understanding;Returned demonstration;Verbal cues required;Tactile cues required;Need further instruction          PT Short Term Goals - 09/13/14 0845    PT SHORT TERM GOAL #1   Title Patient donnes prostheses with cues only. (Target Date: 09/16/2014)   Time 1   Period Months   Status New   PT SHORT TERM GOAL #2   Title Patient tolerates bilateral prostheses wear >6hrs total per day without skin issues or limb pain.  (Target Date: 09/16/2014)   Time 1   Period Months   Status New   PT SHORT TERM GOAL #3   Title Squat-pivot transfer w/c to level mat modified independent with buttock clearance.  (Target Date: 09/16/2014)   Time 1   Period Months   Status New   PT SHORT TERM GOAL #4   Title Sit to / from stand w/c to parallel bars with moderate assist  (Target Date: 09/16/2014)   Baseline MET 09/13/2014   Time 1   Period Months   Status Achieved   PT SHORT TERM GOAL #5   Title Patient ambulates 10' in parallel bars with prostheses with max assist of 1 person with 2nd person supervising for safety.  (Target Date: 09/16/2014)   Baseline MET 09/13/2014   Time 1   Period Months   Status Achieved           PT Long Term Goals - 08/18/14 1445    PT  LONG TERM GOAL #1   Title Tolerates wear of prostheses >90% of awake hours without change in skin integrity nor undue tenderness.  (Target Date: 12/16/2014)   Time 4   Period Months   Status New  PT LONG TERM GOAL #2   Title Patient demonstrates / verbalizes proper prosthetic care for safe use of prostheses without issues.  (Target Date: 12/16/2014)   Time 4   Period Months   Status New   PT LONG TERM GOAL #3   Title Sit to /from stand and stand-pivot transfers with RW & prostheses with minimal assist.  (Target Date: 12/16/2014)   Time 4   Period Months   Status New   PT LONG TERM GOAL #4   Title Standing balance with RW & prostheses manages clothes & reaches 5" with supervision.  (Target Date: 12/16/2014)   Time 4   Period Months   Status New   PT LONG TERM GOAL #5   Title Patient ambulates 66' with RW & prostheses with minimal assist.  (Target Date: 12/16/2014)   Time 4   Period Months   Status New               Plan - 09/13/14 0845    Clinical Impression Statement Patient improved ability to stand up to a walker or the sink. Patient improved gait with walker for longer distance. Patient achieved 2 of 5 STGs.   Pt will benefit from skilled therapeutic intervention in order to improve on the following deficits Abnormal gait;Decreased activity tolerance;Decreased balance;Decreased endurance;Decreased knowledge of precautions;Decreased knowledge of use of DME;Decreased mobility;Decreased range of motion;Decreased strength;Postural dysfunction;Prosthetic Dependency   Rehab Potential Good   Clinical Impairments Affecting Rehab Potential Dialysis limits strength and endurance with slow gains.    PT Frequency 2x / week   PT Duration Other (comment)  17 weeks (4 months - 120 days)   PT Treatment/Interventions ADLs/Self Care Home Management;DME Instruction;Gait training;Functional mobility training;Therapeutic activities;Therapeutic exercise;Balance training;Neuromuscular  re-education;Patient/family education;Prosthetic Training;Passive range of motion   PT Next Visit Plan  Assess remaining STGs. Review prosthetic care. continue with standing & gait with RW. continue with lower extremity strengthening.   Consulted and Agree with Plan of Care Patient;Family member/caregiver   Family Member Consulted dtr        Problem List Patient Active Problem List   Diagnosis Date Noted  . Complications, amputation stump late 05/20/2014  . Weakness generalized 04/07/2014  . Sepsis 04/07/2014  . Decubitus ulcer, stage II 04/07/2014  . Decubitus ulcer of ankle   . Fatigue   . Wound infection 04/06/2014  . Decubitus ulcer of sacral region, stage 2 03/07/2014  . Glaucoma 03/07/2014  . Gout 03/07/2014  . Below knee amputation status 02/18/2014  . Osteomyelitis 01/04/2014  . Phantom limb 12/14/2013  . Type 1 diabetes mellitus with diabetic foot infection   . Diabetes mellitus with peripheral vascular disease   . Asthma 05/17/2013  . History of MRSA infection 04/22/2013  . Peripheral vascular disease 11/19/2012  . End-stage renal disease on hemodialysis 05/05/2012  . Kahler disease 03/30/2012  . Anemia in chronic kidney disease 03/19/2012  . Hypothyroidism 03/19/2012  . Anemia, iron deficiency 03/19/2012  . Hypertension 03/18/2012  . Chronic kidney disease (CKD), stage V 03/18/2012  . Cardiac conduction disorder 03/18/2012  . MGUS (monoclonal gammopathy of unknown significance) 02/28/2011  . Monoclonal paraproteinemia 02/28/2011    Jamey Reas PT, DPT 09/13/2014, 7:41 PM  [facility] [address] [place], [place], [address] Phone: [phone]   Fax:  [phone]

## 2014-09-15 ENCOUNTER — Ambulatory Visit: Payer: Medicare Other | Admitting: Physical Therapy

## 2014-09-20 ENCOUNTER — Encounter: Payer: Self-pay | Admitting: Physical Therapy

## 2014-09-20 ENCOUNTER — Ambulatory Visit: Payer: Medicare Other | Attending: Orthopedic Surgery | Admitting: Physical Therapy

## 2014-09-20 DIAGNOSIS — R269 Unspecified abnormalities of gait and mobility: Secondary | ICD-10-CM | POA: Diagnosis present

## 2014-09-20 DIAGNOSIS — R6889 Other general symptoms and signs: Secondary | ICD-10-CM | POA: Diagnosis present

## 2014-09-20 DIAGNOSIS — R2681 Unsteadiness on feet: Secondary | ICD-10-CM | POA: Diagnosis present

## 2014-09-20 DIAGNOSIS — Z89511 Acquired absence of right leg below knee: Secondary | ICD-10-CM | POA: Insufficient documentation

## 2014-09-20 DIAGNOSIS — R29898 Other symptoms and signs involving the musculoskeletal system: Secondary | ICD-10-CM | POA: Diagnosis present

## 2014-09-20 DIAGNOSIS — R29818 Other symptoms and signs involving the nervous system: Secondary | ICD-10-CM | POA: Insufficient documentation

## 2014-09-20 DIAGNOSIS — R2689 Other abnormalities of gait and mobility: Secondary | ICD-10-CM

## 2014-09-20 DIAGNOSIS — Z89512 Acquired absence of left leg below knee: Secondary | ICD-10-CM | POA: Insufficient documentation

## 2014-09-20 DIAGNOSIS — Z5189 Encounter for other specified aftercare: Secondary | ICD-10-CM | POA: Diagnosis present

## 2014-09-20 NOTE — Therapy (Signed)
[facility] [address] [place], [place], [address] Phone: [phone]   Fax:  [phone]  Physical Therapy Treatment  Patient Details  Name: Jack [name] MRN: [id number] Date of Birth: 1945/12/06 Referring Provider:  Lucianne Lei, MD  Encounter Date: 09/20/2014      PT End of Session - 09/20/14 0849    Visit Number 9   Number of Visits 35   Date for PT Re-Evaluation 10/17/14   Authorization Type G-Code every 10 visits   PT Start Time 0845   PT Stop Time 0930   PT Time Calculation (min) 45 min   Equipment Utilized During Treatment Gait belt   Activity Tolerance Patient limited by fatigue;Patient tolerated treatment well   Behavior During Therapy [facility] for tasks assessed/performed      Past Medical History  Diagnosis Date  . Hypertension   . Pneumonia     2012  . Heart murmur   . Glaucoma   . Multiple myeloma, without mention of having achieved remission 03/30/2012    Cytogenetic neg on 03/23/2012.  . Asthma   . Hyperparathyroidism, secondary renal   . Peripheral arterial disease   . End-stage renal disease on hemodialysis     Started HD March 2014.  Cause of ESRD was DM.  Gets HD at [facility] on [place] on MWF schedule.   . Thyroid disease     hyperparathyroidism  . MRSA bacteremia   . Anemia   . Peripheral vascular disease, unspecified 11/19/2012    In the past had R foot toe amps then R TMA. In 2015 had left foot toe amp > then TMA >then L BKA on 05/14/13   . History of MRSA infection 04/22/2013    Bacteremia assoc w L foot wound infection Mar 2015   . Gangrene of foot   . ESRD on hemodialysis   . Diabetes mellitus with peripheral vascular disease   . Hypothyroidism   . Decubitus ulcer of sacral region, stage 2 03/07/2014  . Glaucoma 03/07/2014  . Gangrene     right BKA  . Contracture of joint     left knee    Past Surgical History  Procedure Laterality Date  . Thyroidectomy    . Cervical  disc surgery    . Insertion of dialysis catheter Right 03/19/2012    Procedure: INSERTION OF DIALYSIS CATHETER;  Surgeon: Mal Misty, MD;  Location: [place];  Service: Vascular;  Laterality: Right;  Right Internal Jugular  . Av fistula placement Left 03/25/2012    Procedure: ARTERIOVENOUS (AV) FISTULA CREATION;  Surgeon: Mal Misty, MD;  Location: [place];  Service: Vascular;  Laterality: Left;  . Ligation of competing branches of arteriovenous fistula Left 05/08/2012    Procedure: LIGATION OF COMPETING BRANCHES OF ARTERIOVENOUS FISTULA;  Surgeon: Mal Misty, MD;  Location: [place];  Service: Vascular;  Laterality: Left;  Ultrasound guided  . Cardiac catheterization      approx 30 years ago  . Amputation Right 11/10/2012    Procedure: AMPUTATION FIRST and SECOND TOES Right Foot;  Surgeon: Elam Dutch, MD;  Location: [facility] OR;  Service: Vascular;  Laterality: Right;  . Transmetatarsal amputation Left 12/16/2012    Procedure: TRANSMETATARSAL AMPUTATION AND VAC PLACEMENT;  Surgeon: Elam Dutch, MD;  Location: [place];  Service: Vascular;  Laterality: Left;  . Amputation Left 04/07/2013    Procedure: AMPUTATION DIGIT- LEFT 1ST TOE;  Surgeon: Mal Misty, MD;  Location: [place];  Service: Vascular;  Laterality: Left;  . Tee without cardioversion N/A 04/20/2013    Procedure: TRANSESOPHAGEAL ECHOCARDIOGRAM (TEE);  Surgeon: Josue Hector, MD;  Location: [facility] ENDOSCOPY;  Service: Cardiovascular;  Laterality: N/A;  . I&d extremity Left 04/22/2013    Procedure: QAESLPNPYY FRT DEBRIDEMENT LEFT FIRST TOE AMPUTATION WOUND ;  Surgeon: Mal Misty, MD;  Location: [place];  Service: Vascular;  Laterality: Left;  . Amputation Left 04/26/2013    Procedure: Left Foot Transmetatarsal Amputation;  Surgeon: Newt Minion, MD;  Location: [place];  Service: Orthopedics;  Laterality: Left;  . Toe amputation      D/C 04-30-13  . Below knee leg amputation Left 05/14/2013    DR [name]  . Amputation Left 05/14/2013     Procedure: AMPUTATION BELOW KNEE;  Surgeon: Newt Minion, MD;  Location: [place];  Service: Orthopedics;  Laterality: Left;  Left Below Knee Amputation  . Eye surgery Bilateral     CATARACTS  . Amputation Left 07/23/2013    Procedure: AMPUTATION BELOW KNEE;  Surgeon: Newt Minion, MD;  Location: [place];  Service: Orthopedics;  Laterality: Left;  Left Below Knee Amputation Revision  . Abdominal aortagram Bilateral 11/06/2012    Procedure: ABDOMINAL AORTAGRAM;  Surgeon: Elam Dutch, MD;  Location: [facility] CATH LAB;  Service: Cardiovascular;  Laterality: Bilateral;  . I&d extremity Right 01/05/2014    Procedure: IRRIGATION AND DEBRIDEMENT Right Heel Ulcer;  Surgeon: Mcarthur Rossetti, MD;  Location: [place];  Service: Orthopedics;  Laterality: Right;  Surgeon Available after 5PM  . Below knee leg amputation Right 02/18/2014    dr duda  . Amputation Right 02/18/2014    Procedure: AMPUTATION BELOW KNEE;  Surgeon: Newt Minion, MD;  Location: [place];  Service: Orthopedics;  Laterality: Right;  . Stump revision Right 05/20/2014    Procedure: Revision Right Below Knee Amputation;  Surgeon: Newt Minion, MD;  Location: [place];  Service: Orthopedics;  Laterality: Right;  . Repair quadriceps/hamstring muscles Left 05/20/2014    Procedure: Left Hamstring Release;  Surgeon: Newt Minion, MD;  Location: [place];  Service: Orthopedics;  Laterality: Left;    There were no vitals filed for this visit.  Visit Diagnosis:  Weakness of both legs  Decreased functional activity tolerance  Balance problems  Abnormality of gait  Status post bilateral below knee amputation  Unsteadiness      Subjective Assessment - 09/20/14 0848    Subjective No new complaints. No falls ro pain to report. Does have soreness on his limbs from wearing prostheses.            [place] Adult PT Treatment/Exercise - 09/20/14 0851    Transfers   Sit to Stand 4: Min assist;With upper extremity assist;With armrests;From  chair/3-in-1   Sit to Stand Details Verbal cues for sequencing;Verbal cues for technique;Verbal cues for precautions/safety   Stand to Sit 4: Min assist;With upper extremity assist;With armrests;To chair/3-in-1   Stand to Sit Details (indicate cue type and reason) Verbal cues for sequencing;Verbal cues for technique;Verbal cues for precautions/safety   Ambulation/Gait   Ambulation/Gait Yes   Ambulation/Gait Assistance 2: Max assist;3: Mod assist   Ambulation/Gait Assistance Details multimodal cues on posture, gait sequence and walker position with gait   Ambulation Distance (Feet) 5 Feet  x2, 10 x3   Assistive device Prostheses;Rolling walker   Gait Pattern Step-to pattern;Decreased step length - right;Decreased step length - left;Decreased stride length;Decreased hip/knee flexion - right;Decreased hip/knee flexion - left;Right  flexed knee in stance;Left flexed knee in stance;Lateral hip instability;Trunk flexed;Poor foot clearance - left;Poor foot clearance - right   Ambulation Surface Level;Indoor   Knee/Hip Exercises: Aerobic   Other Aerobic Scifit x 4 extremities level 1.8 with goal rpm >/= 50 for strengthening and activity tolerance x 10 minutes   Prosthetics   Prosthetic Care Comments  had prostheses weighted at dialysis this past week   Current prosthetic wear tolerance (days/week)  7 days /wk   Current prosthetic wear tolerance (#hours/day)  wearing liner 5-6 hours 2x day, socket's only 1x day for 5-6 hours (varied reports between pt and daughter)   Residual limb condition  none per pt report   Education Provided Residual limb care;Correct ply sock adjustment;Proper Donning;Proper Doffing;Proper wear schedule/adjustment   Person(s) Educated Patient;Child(ren)  daughter   Education Method Explanation;Demonstration   Education Method Verbalized understanding;Verbal cues required   Donning Prosthesis Minimal assist   Doffing Prosthesis Supervision            PT Short Term Goals  - 09/20/14 0900    PT SHORT TERM GOAL #1   Title Patient donnes prostheses with cues only. (Target Date: 09/16/2014)   Status Not Met   PT SHORT TERM GOAL #2   Title Patient tolerates bilateral prostheses wear >6hrs total per day without skin issues or limb pain.  (Target Date: 09/16/2014)   Status Not Met   PT SHORT TERM GOAL #3   Title Squat-pivot transfer w/c to level mat modified independent with buttock clearance.  (Target Date: 09/16/2014)   Baseline 9/6: pt still with limiited clearance with level transfers.   Status Not Met   PT SHORT TERM GOAL #4   Title Sit to / from stand w/c to parallel bars with moderate assist  (Target Date: 09/16/2014)   Baseline MET 09/13/2014   Status Achieved   PT SHORT TERM GOAL #5   Title Patient ambulates 10' in parallel bars with prostheses with max assist of 1 person with 2nd person supervising for safety.  (Target Date: 09/16/2014)   Baseline MET 09/13/2014   Time 1   Period Months   Status Achieved           PT Long Term Goals - 08/18/14 1445    PT LONG TERM GOAL #1   Title Tolerates wear of prostheses >90% of awake hours without change in skin integrity nor undue tenderness.  (Target Date: 12/16/2014)   Time 4   Period Months   Status New   PT LONG TERM GOAL #2   Title Patient demonstrates / verbalizes proper prosthetic care for safe use of prostheses without issues.  (Target Date: 12/16/2014)   Time 4   Period Months   Status New   PT LONG TERM GOAL #3   Title Sit to /from stand and stand-pivot transfers with RW & prostheses with minimal assist.  (Target Date: 12/16/2014)   Time 4   Period Months   Status New   PT LONG TERM GOAL #4   Title Standing balance with RW & prostheses manages clothes & reaches 5" with supervision.  (Target Date: 12/16/2014)   Time 4   Period Months   Status New   PT LONG TERM GOAL #5   Title Patient ambulates 39' with RW & prostheses with minimal assist.  (Target Date: 12/16/2014)   Time 4   Period Months    Status New           Plan - 09/20/14 0092  Clinical Impression Statement Pt did not meet remaining STG's, however is progressing towards them and LTG's.   Pt will benefit from skilled therapeutic intervention in order to improve on the following deficits Abnormal gait;Decreased activity tolerance;Decreased balance;Decreased endurance;Decreased knowledge of precautions;Decreased knowledge of use of DME;Decreased mobility;Decreased range of motion;Decreased strength;Postural dysfunction;Prosthetic Dependency   Rehab Potential Good   Clinical Impairments Affecting Rehab Potential Dialysis limits strength and endurance with slow gains.    PT Frequency 2x / week   PT Duration Other (comment)  17 weeks (4 months - 120 days)   PT Treatment/Interventions ADLs/Self Care Home Management;DME Instruction;Gait training;Functional mobility training;Therapeutic activities;Therapeutic exercise;Balance training;Neuromuscular re-education;Patient/family education;Prosthetic Training;Passive range of motion   PT Next Visit Plan  continue with standing & gait with RW. continue with lower extremity strengthening.   Consulted and Agree with Plan of Care Patient;Family member/caregiver   Family Member Consulted dtr        Problem List Patient Active Problem List   Diagnosis Date Noted  . Complications, amputation stump late 05/20/2014  . Weakness generalized 04/07/2014  . Sepsis 04/07/2014  . Decubitus ulcer, stage II 04/07/2014  . Decubitus ulcer of ankle   . Fatigue   . Wound infection 04/06/2014  . Decubitus ulcer of sacral region, stage 2 03/07/2014  . Glaucoma 03/07/2014  . Gout 03/07/2014  . Below knee amputation status 02/18/2014  . Osteomyelitis 01/04/2014  . Phantom limb 12/14/2013  . Type 1 diabetes mellitus with diabetic foot infection   . Diabetes mellitus with peripheral vascular disease   . Asthma 05/17/2013  . History of MRSA infection 04/22/2013  . Peripheral vascular disease  11/19/2012  . End-stage renal disease on hemodialysis 05/05/2012  . Kahler disease 03/30/2012  . Anemia in chronic kidney disease 03/19/2012  . Hypothyroidism 03/19/2012  . Anemia, iron deficiency 03/19/2012  . Hypertension 03/18/2012  . Chronic kidney disease (CKD), stage V 03/18/2012  . Cardiac conduction disorder 03/18/2012  . MGUS (monoclonal gammopathy of unknown significance) 02/28/2011  . Monoclonal paraproteinemia 02/28/2011    Willow Ora 09/21/2014, 8:30 AM  Willow Ora, PTA, [facility] [address], [place], [place] [address] [phone] 09/21/2014, 8:30 AM

## 2014-09-22 ENCOUNTER — Encounter: Payer: Medicare Other | Admitting: Physical Therapy

## 2014-09-22 ENCOUNTER — Ambulatory Visit: Payer: Medicare Other | Admitting: Physical Therapy

## 2014-09-22 ENCOUNTER — Encounter: Payer: Self-pay | Admitting: Physical Therapy

## 2014-09-22 DIAGNOSIS — R2689 Other abnormalities of gait and mobility: Secondary | ICD-10-CM

## 2014-09-22 DIAGNOSIS — R2681 Unsteadiness on feet: Secondary | ICD-10-CM

## 2014-09-22 DIAGNOSIS — Z89512 Acquired absence of left leg below knee: Secondary | ICD-10-CM

## 2014-09-22 DIAGNOSIS — R29898 Other symptoms and signs involving the musculoskeletal system: Secondary | ICD-10-CM | POA: Diagnosis not present

## 2014-09-22 DIAGNOSIS — Z4789 Encounter for other orthopedic aftercare: Secondary | ICD-10-CM

## 2014-09-22 DIAGNOSIS — R269 Unspecified abnormalities of gait and mobility: Secondary | ICD-10-CM

## 2014-09-22 DIAGNOSIS — Z89511 Acquired absence of right leg below knee: Secondary | ICD-10-CM

## 2014-09-22 DIAGNOSIS — R6889 Other general symptoms and signs: Secondary | ICD-10-CM

## 2014-09-23 NOTE — Therapy (Signed)
[facility] [address] [place], , [address] Phone: [phone]   Fax:  [phone]  Physical Therapy Treatment  Patient Details  Name: Jack [name] MRN: [id number] Date of Birth: 12/05/1945 Referring Provider:  Bland, Veita, MD  Encounter Date: 09/22/2014      PT End of Session - 09/22/14 0845    Visit Number 10   Number of Visits 35   Date for PT Re-Evaluation 10/17/14   Authorization Type G-Code every 10 visits   PT Start Time 0845   PT Stop Time 0930   PT Time Calculation (min) 45 min   Equipment Utilized During Treatment Gait belt   Activity Tolerance Patient limited by fatigue;Patient tolerated treatment well   Behavior During Therapy WFL for tasks assessed/performed      Past Medical History  Diagnosis Date  . Hypertension   . Pneumonia     2012  . Heart murmur   . Glaucoma   . Multiple myeloma, without mention of having achieved remission 03/30/2012    Cytogenetic neg on 03/23/2012.  . Asthma   . Hyperparathyroidism, secondary renal   . Peripheral arterial disease   . End-stage renal disease on hemodialysis     Started HD March 2014.  Cause of ESRD was DM.  Gets HD at [facility] on [address] on MWF schedule.   . Thyroid disease     hyperparathyroidism  . MRSA bacteremia   . Anemia   . Peripheral vascular disease, unspecified 11/19/2012    In the past had R foot toe amps then R TMA. In 2015 had left foot toe amp > then TMA >then L BKA on 05/14/13   . History of MRSA infection 04/22/2013    Bacteremia assoc w L foot wound infection Mar 2015   . Gangrene of foot   . ESRD on hemodialysis   . Diabetes mellitus with peripheral vascular disease   . Hypothyroidism   . Decubitus ulcer of sacral region, stage 2 03/07/2014  . Glaucoma 03/07/2014  . Gangrene     right BKA  . Contracture of joint     left knee    Past Surgical History  Procedure Laterality Date  . Thyroidectomy    . Cervical  disc surgery    . Insertion of dialysis catheter Right 03/19/2012    Procedure: INSERTION OF DIALYSIS CATHETER;  Surgeon: James D Lawson, MD;  Location: [facility] OR;  Service: Vascular;  Laterality: Right;  Right Internal Jugular  . Av fistula placement Left 03/25/2012    Procedure: ARTERIOVENOUS (AV) FISTULA CREATION;  Surgeon: James D Lawson, MD;  Location: [facility] OR;  Service: Vascular;  Laterality: Left;  . Ligation of competing branches of arteriovenous fistula Left 05/08/2012    Procedure: LIGATION OF COMPETING BRANCHES OF ARTERIOVENOUS FISTULA;  Surgeon: James D Lawson, MD;  Location: [facility] OR;  Service: Vascular;  Laterality: Left;  Ultrasound guided  . Cardiac catheterization      approx 30 years ago  . Amputation Right 11/10/2012    Procedure: AMPUTATION FIRST and SECOND TOES Right Foot;  Surgeon: Charles E Fields, MD;  Location: [facility] OR;  Service: Vascular;  Laterality: Right;  . Transmetatarsal amputation Left 12/16/2012    Procedure: TRANSMETATARSAL AMPUTATION AND VAC PLACEMENT;  Surgeon: Charles E Fields, MD;  Location: [facility];  Service: Vascular;  Laterality: Left;  . Amputation Left 04/07/2013    Procedure: AMPUTATION DIGIT- LEFT 1ST TOE;  Surgeon: James D Lawson, MD;  Location: [facility];    Service: Vascular;  Laterality: Left;  . Tee without cardioversion N/A 04/20/2013    Procedure: TRANSESOPHAGEAL ECHOCARDIOGRAM (TEE);  Surgeon: Josue Hector, MD;  Location: [facility] ENDOSCOPY;  Service: Cardiovascular;  Laterality: N/A;  . I&d extremity Left 04/22/2013    Procedure: FIEPPIRJJO ACZ DEBRIDEMENT LEFT FIRST TOE AMPUTATION WOUND ;  Surgeon: Mal Misty, MD;  Location: [place];  Service: Vascular;  Laterality: Left;  . Amputation Left 04/26/2013    Procedure: Left Foot Transmetatarsal Amputation;  Surgeon: Newt Minion, MD;  Location: [place];  Service: Orthopedics;  Laterality: Left;  . Toe amputation      D/C 04-30-13  . Below knee leg amputation Left 05/14/2013    DR [name]  . Amputation Left 05/14/2013     Procedure: AMPUTATION BELOW KNEE;  Surgeon: Newt Minion, MD;  Location: [place];  Service: Orthopedics;  Laterality: Left;  Left Below Knee Amputation  . Eye surgery Bilateral     CATARACTS  . Amputation Left 07/23/2013    Procedure: AMPUTATION BELOW KNEE;  Surgeon: Newt Minion, MD;  Location: [place];  Service: Orthopedics;  Laterality: Left;  Left Below Knee Amputation Revision  . Abdominal aortagram Bilateral 11/06/2012    Procedure: ABDOMINAL AORTAGRAM;  Surgeon: Elam Dutch, MD;  Location: [facility] CATH LAB;  Service: Cardiovascular;  Laterality: Bilateral;  . I&d extremity Right 01/05/2014    Procedure: IRRIGATION AND DEBRIDEMENT Right Heel Ulcer;  Surgeon: Mcarthur Rossetti, MD;  Location: [place];  Service: Orthopedics;  Laterality: Right;  Surgeon Available after 5PM  . Below knee leg amputation Right 02/18/2014    dr duda  . Amputation Right 02/18/2014    Procedure: AMPUTATION BELOW KNEE;  Surgeon: Newt Minion, MD;  Location: [place];  Service: Orthopedics;  Laterality: Right;  . Stump revision Right 05/20/2014    Procedure: Revision Right Below Knee Amputation;  Surgeon: Newt Minion, MD;  Location: [place];  Service: Orthopedics;  Laterality: Right;  . Repair quadriceps/hamstring muscles Left 05/20/2014    Procedure: Left Hamstring Release;  Surgeon: Newt Minion, MD;  Location: [place];  Service: Orthopedics;  Laterality: Left;    There were no vitals filed for this visit.  Visit Diagnosis:  Weakness of both legs  Decreased functional activity tolerance  Balance problems  Abnormality of gait  Status post bilateral below knee amputation  Unsteadiness  Encounter for prosthetic gait training      Subjective Assessment - 09/22/14 0852    Subjective No issues. Wearing prostheses 6 hrs 1x/day on dialysis days & 2x/days on non-dialysis days. He wears the liners only to dialysis as advised and no issues but sweaty.   Currently in Pain? No/denies                          [facility] Adult PT Treatment/Exercise - 09/22/14 0930    Transfers   Sit to Stand 4: Min guard;4: Min assist;With upper extremity assist;With armrests;From chair/3-in-1  MIn guard from w/c, MinA chair with armrest   Sit to Stand Details Verbal cues for sequencing;Verbal cues for technique;Verbal cues for precautions/safety   Stand to Sit 4: Min guard;With upper extremity assist;With armrests;To chair/3-in-1   Stand to Sit Details (indicate cue type and reason) Verbal cues for sequencing;Verbal cues for technique;Verbal cues for precautions/safety   Stand Pivot Transfers 3: Mod assist  RW & prostheses   Stand Pivot Transfer Details (indicate cue type and reason) PT demo, instructed  prior & during activity on technique   Ambulation/Gait   Ambulation/Gait Yes   Ambulation/Gait Assistance 4: Min assist   Ambulation/Gait Assistance Details cues on posture, step length, LE extension in stance, wt shift, turning to position to sit   Ambulation Distance (Feet) 20 Feet  20' X 2   Assistive device Prostheses;Rolling walker   Gait Pattern Step-to pattern;Decreased step length - right;Decreased step length - left;Decreased stride length;Decreased hip/knee flexion - right;Decreased hip/knee flexion - left;Right flexed knee in stance;Left flexed knee in stance;Lateral hip instability;Trunk flexed;Poor foot clearance - left;Poor foot clearance - right   Ambulation Surface Indoor;Level   Knee/Hip Exercises: Aerobic   Other Aerobic --   Prosthetics   Prosthetic Care Comments  wearing prostheses to dialysis, if needed for comfort remove prostheses once on chair but leave liners on limbs so he can easily donne prostheses to transfer off chair   Current prosthetic wear tolerance (days/week)  7 days /wk   Current prosthetic wear tolerance (#hours/day)  increase to all awake hours except 1hr mid-day, dry half way each wear   Residual limb condition  no issues noted    Education Provided Residual limb care;Correct ply sock adjustment;Proper Donning;Proper Doffing;Proper wear schedule/adjustment   Person(s) Educated Patient;Child(ren)   Education Method Explanation;Demonstration;Tactile cues;Verbal cues   Education Method Verbalized understanding;Returned demonstration;Tactile cues required;Verbal cues required;Needs further instruction   Donning Prosthesis Supervision   Doffing Prosthesis Supervision                  PT Short Term Goals - 09/22/14 0845    PT SHORT TERM GOAL #1   Title Patient donnes prostheses with cues only. (Target Date: 09/16/2014)   Baseline MET 09/22/2014   Status Achieved   PT SHORT TERM GOAL #2   Title Patient tolerates bilateral prostheses wear >6hrs total per day without skin issues or limb pain.  (Target Date: 09/16/2014)   Baseline MET 09/22/2014   Status Achieved   PT SHORT TERM GOAL #3   Title Squat-pivot transfer w/c to level mat modified independent with buttock clearance.  (Target Date: 09/16/2014)   Baseline 09/22/2014 MET with general cues to lift buttocks more   Status Achieved   PT SHORT TERM GOAL #4   Title Sit to / from stand w/c to parallel bars with moderate assist  (Target Date: 09/16/2014)   Baseline MET 09/13/2014   Status Achieved   PT SHORT TERM GOAL #5   Title Patient ambulates 10' in parallel bars with prostheses with max assist of 1 person with 2nd person supervising for safety.  (Target Date: 09/16/2014)   Baseline MET 09/13/2014   Time 1   Period Months   Status Achieved   Additional Short Term Goals   Additional Short Term Goals Yes   PT SHORT TERM GOAL #6   Title Patient tolerates wear of prostheses >90% of awake hours, at dialysis may remove prostheses for comfort, with PT guidance cues. (Target Date: 10/14/2014)   Time 4   Period Weeks   Status New   PT SHORT TERM GOAL #7   Title Patient performs stand-pivot transfers w/c to chair with armrests with RW & prostheses with minimal assist. (Target  Date: 10/14/2014)   Time 4   Period Weeks   Status New   PT SHORT TERM GOAL #8   Title Patient ambulates 25' with RW & prostheses including turning to sit in chair with armrests with MinA. (Target Date: 10/14/2014)   Time 4   Period Weeks     Status New           PT Long Term Goals - 08/18/14 1445    PT LONG TERM GOAL #1   Title Tolerates wear of prostheses >90% of awake hours without change in skin integrity nor undue tenderness.  (Target Date: 12/16/2014)   Time 4   Period Months   Status New   PT LONG TERM GOAL #2   Title Patient demonstrates / verbalizes proper prosthetic care for safe use of prostheses without issues.  (Target Date: 12/16/2014)   Time 4   Period Months   Status New   PT LONG TERM GOAL #3   Title Sit to /from stand and stand-pivot transfers with RW & prostheses with minimal assist.  (Target Date: 12/16/2014)   Time 4   Period Months   Status New   PT LONG TERM GOAL #4   Title Standing balance with RW & prostheses manages clothes & reaches 5" with supervision.  (Target Date: 12/16/2014)   Time 4   Period Months   Status New   PT LONG TERM GOAL #5   Title Patient ambulates 50' with RW & prostheses with minimal assist.  (Target Date: 12/16/2014)   Time 4   Period Months   Status New               Plan - 09/22/14 0845    Clinical Impression Statement Patient met STGs. Patient appears close to being able to ambulate at home with family assistance. Patient needed less assistance for gait today but struggled with turning to position to sit down.    Pt will benefit from skilled therapeutic intervention in order to improve on the following deficits Abnormal gait;Decreased activity tolerance;Decreased balance;Decreased endurance;Decreased knowledge of precautions;Decreased knowledge of use of DME;Decreased mobility;Decreased range of motion;Decreased strength;Postural dysfunction;Prosthetic Dependency   Rehab Potential Good   Clinical Impairments Affecting  Rehab Potential Dialysis limits strength and endurance with slow gains.    PT Frequency 2x / week   PT Duration Other (comment)  17 weeks (4 months - 120 days)   PT Treatment/Interventions ADLs/Self Care Home Management;DME Instruction;Gait training;Functional mobility training;Therapeutic activities;Therapeutic exercise;Balance training;Neuromuscular re-education;Patient/family education;Prosthetic Training;Passive range of motion   PT Next Visit Plan  continue with standing & gait with RW including turning to sit in chairs with armrests. continue with lower extremity strengthening.   Consulted and Agree with Plan of Care Patient;Family member/caregiver   Family Member Consulted dtr          G-Codes - 09/22/14 0845    Functional Assessment Tool Used tolerates wear of bilateral prostheses 6hrs 2x/day on non-dialysis days and 1x/day on dialysis days, liner only to dialysis, donnes prostheses with cues & increased time, verbalizes proper cleaning & residual limb care   Functional Limitation Self care   Self Care Current Status (G8987) At least 40 percent but less than 60 percent impaired, limited or restricted   Self Care Goal Status (G8988) At least 1 percent but less than 20 percent impaired, limited or restricted      Problem List Patient Active Problem List   Diagnosis Date Noted  . Complications, amputation stump late 05/20/2014  . Weakness generalized 04/07/2014  . Sepsis 04/07/2014  . Decubitus ulcer, stage II 04/07/2014  . Decubitus ulcer of ankle   . Fatigue   . Wound infection 04/06/2014  . Decubitus ulcer of sacral region, stage 2 03/07/2014  . Glaucoma 03/07/2014  . Gout 03/07/2014  . Below knee amputation status 02/18/2014  .   Osteomyelitis 01/04/2014  . Phantom limb 12/14/2013  . Type 1 diabetes mellitus with diabetic foot infection   . Diabetes mellitus with peripheral vascular disease   . Asthma 05/17/2013  . History of MRSA infection 04/22/2013  . Peripheral  vascular disease 11/19/2012  . End-stage renal disease on hemodialysis 05/05/2012  . Kahler disease 03/30/2012  . Anemia in chronic kidney disease 03/19/2012  . Hypothyroidism 03/19/2012  . Anemia, iron deficiency 03/19/2012  . Hypertension 03/18/2012  . Chronic kidney disease (CKD), stage V 03/18/2012  . Cardiac conduction disorder 03/18/2012  . MGUS (monoclonal gammopathy of unknown significance) 02/28/2011  . Monoclonal paraproteinemia 02/28/2011     Physical Therapy Progress Note  Dates of Reporting Period: 08/19/2014 to 09/22/2014  Objective Reports of Subjective Statement: Patient and daughter report improved transfers at home. He reports no issues with wear of prostheses longer periods during day that enables potential for function during awake hours.  Objective Measurements: See above  Goal Update: See STGs above.  Plan: see above  Reason Skilled Services are Required: Patient requires instruction for safe prostheses use & progressing mobility.     Jamey Reas PT, DPT 09/23/2014, 9:58 AM  [facility] [address] [place], [place], [address] Phone: [phone]   Fax:  [phone]

## 2014-09-27 ENCOUNTER — Ambulatory Visit: Payer: Medicare Other | Admitting: Physical Therapy

## 2014-09-29 ENCOUNTER — Encounter: Payer: Self-pay | Admitting: Physical Therapy

## 2014-09-29 ENCOUNTER — Ambulatory Visit: Payer: Medicare Other | Admitting: Physical Therapy

## 2014-09-29 DIAGNOSIS — R6889 Other general symptoms and signs: Secondary | ICD-10-CM

## 2014-09-29 DIAGNOSIS — R29898 Other symptoms and signs involving the musculoskeletal system: Secondary | ICD-10-CM

## 2014-09-29 DIAGNOSIS — Z89512 Acquired absence of left leg below knee: Secondary | ICD-10-CM

## 2014-09-29 DIAGNOSIS — R269 Unspecified abnormalities of gait and mobility: Secondary | ICD-10-CM

## 2014-09-29 DIAGNOSIS — Z4789 Encounter for other orthopedic aftercare: Secondary | ICD-10-CM

## 2014-09-29 DIAGNOSIS — R2689 Other abnormalities of gait and mobility: Secondary | ICD-10-CM

## 2014-09-29 DIAGNOSIS — R2681 Unsteadiness on feet: Secondary | ICD-10-CM

## 2014-09-29 DIAGNOSIS — Z89511 Acquired absence of right leg below knee: Secondary | ICD-10-CM

## 2014-09-30 NOTE — Therapy (Signed)
[facility] [address] [place], [place], [address] Phone: [phone]   Fax:  [phone]  Physical Therapy Treatment  Patient Details  Name: Jack [name] MRN: [id number] Date of Birth: March 22, 1945 Referring Provider:  Lucianne Lei, MD  Encounter Date: 09/29/2014      PT End of Session - 09/29/14 0930    Visit Number 11   Number of Visits 35   Date for PT Re-Evaluation 10/17/14   Authorization Type G-Code every 10 visits   PT Start Time 0930   PT Stop Time 1015   PT Time Calculation (min) 45 min   Equipment Utilized During Treatment Gait belt   Activity Tolerance Patient limited by fatigue;Patient tolerated treatment well   Behavior During Therapy [facility] for tasks assessed/performed      Past Medical History  Diagnosis Date  . Hypertension   . Pneumonia     2012  . Heart murmur   . Glaucoma   . Multiple myeloma, without mention of having achieved remission 03/30/2012    Cytogenetic neg on 03/23/2012.  . Asthma   . Hyperparathyroidism, secondary renal   . Peripheral arterial disease   . End-stage renal disease on hemodialysis     Started HD March 2014.  Cause of ESRD was DM.  Gets HD at [facility] on [address] on MWF schedule.   . Thyroid disease     hyperparathyroidism  . MRSA bacteremia   . Anemia   . Peripheral vascular disease, unspecified 11/19/2012    In the past had R foot toe amps then R TMA. In 2015 had left foot toe amp > then TMA >then L BKA on 05/14/13   . History of MRSA infection 04/22/2013    Bacteremia assoc w L foot wound infection Mar 2015   . Gangrene of foot   . ESRD on hemodialysis   . Diabetes mellitus with peripheral vascular disease   . Hypothyroidism   . Decubitus ulcer of sacral region, stage 2 03/07/2014  . Glaucoma 03/07/2014  . Gangrene     right BKA  . Contracture of joint     left knee    Past Surgical History  Procedure Laterality Date  . Thyroidectomy    . Cervical  disc surgery    . Insertion of dialysis catheter Right 03/19/2012    Procedure: INSERTION OF DIALYSIS CATHETER;  Surgeon: Mal Misty, MD;  Location: [place];  Service: Vascular;  Laterality: Right;  Right Internal Jugular  . Av fistula placement Left 03/25/2012    Procedure: ARTERIOVENOUS (AV) FISTULA CREATION;  Surgeon: Mal Misty, MD;  Location: [place];  Service: Vascular;  Laterality: Left;  . Ligation of competing branches of arteriovenous fistula Left 05/08/2012    Procedure: LIGATION OF COMPETING BRANCHES OF ARTERIOVENOUS FISTULA;  Surgeon: Mal Misty, MD;  Location: [place];  Service: Vascular;  Laterality: Left;  Ultrasound guided  . Cardiac catheterization      approx 30 years ago  . Amputation Right 11/10/2012    Procedure: AMPUTATION FIRST and SECOND TOES Right Foot;  Surgeon: Elam Dutch, MD;  Location: [facility] OR;  Service: Vascular;  Laterality: Right;  . Transmetatarsal amputation Left 12/16/2012    Procedure: TRANSMETATARSAL AMPUTATION AND VAC PLACEMENT;  Surgeon: Elam Dutch, MD;  Location: [place];  Service: Vascular;  Laterality: Left;  . Amputation Left 04/07/2013    Procedure: AMPUTATION DIGIT- LEFT 1ST TOE;  Surgeon: Mal Misty, MD;  Location: [place];  Service: Vascular;  Laterality: Left;  . Tee without cardioversion N/A 04/20/2013    Procedure: TRANSESOPHAGEAL ECHOCARDIOGRAM (TEE);  Surgeon: Josue Hector, MD;  Location: [facility] ENDOSCOPY;  Service: Cardiovascular;  Laterality: N/A;  . I&d extremity Left 04/22/2013    Procedure: [id number] VKP DEBRIDEMENT LEFT FIRST TOE AMPUTATION WOUND ;  Surgeon: Mal Misty, MD;  Location: [place];  Service: Vascular;  Laterality: Left;  . Amputation Left 04/26/2013    Procedure: Left Foot Transmetatarsal Amputation;  Surgeon: Newt Minion, MD;  Location: [place];  Service: Orthopedics;  Laterality: Left;  . Toe amputation      D/C 04-30-13  . Below knee leg amputation Left 05/14/2013    DR [name]  . Amputation Left 05/14/2013     Procedure: AMPUTATION BELOW KNEE;  Surgeon: Newt Minion, MD;  Location: [place];  Service: Orthopedics;  Laterality: Left;  Left Below Knee Amputation  . Eye surgery Bilateral     CATARACTS  . Amputation Left 07/23/2013    Procedure: AMPUTATION BELOW KNEE;  Surgeon: Newt Minion, MD;  Location: [place];  Service: Orthopedics;  Laterality: Left;  Left Below Knee Amputation Revision  . Abdominal aortagram Bilateral 11/06/2012    Procedure: ABDOMINAL AORTAGRAM;  Surgeon: Elam Dutch, MD;  Location: [facility] CATH LAB;  Service: Cardiovascular;  Laterality: Bilateral;  . I&d extremity Right 01/05/2014    Procedure: IRRIGATION AND DEBRIDEMENT Right Heel Ulcer;  Surgeon: Mcarthur Rossetti, MD;  Location: [place];  Service: Orthopedics;  Laterality: Right;  Surgeon Available after 5PM  . Below knee leg amputation Right 02/18/2014    dr duda  . Amputation Right 02/18/2014    Procedure: AMPUTATION BELOW KNEE;  Surgeon: Newt Minion, MD;  Location: [place];  Service: Orthopedics;  Laterality: Right;  . Stump revision Right 05/20/2014    Procedure: Revision Right Below Knee Amputation;  Surgeon: Newt Minion, MD;  Location: [place];  Service: Orthopedics;  Laterality: Right;  . Repair quadriceps/hamstring muscles Left 05/20/2014    Procedure: Left Hamstring Release;  Surgeon: Newt Minion, MD;  Location: [place];  Service: Orthopedics;  Laterality: Left;    There were no vitals filed for this visit.  Visit Diagnosis:  Weakness of both legs  Decreased functional activity tolerance  Balance problems  Abnormality of gait  Status post bilateral below knee amputation  Unsteadiness  Encounter for prosthetic gait training      Subjective Assessment - 09/29/14 0937    Subjective He saw prosthetist Tuesday who made changes. He is wearing prostheses 5 hrs but usually once per day.   Currently in Pain? No/denies                         [facility] Adult PT Treatment/Exercise - 09/29/14 0930     Transfers   Sit to Stand 4: Min assist;With upper extremity assist;With armrests;From chair/3-in-1;5: Supervision  supervision w/c to sink   Sit to Stand Details Verbal cues for sequencing;Verbal cues for technique;Verbal cues for precautions/safety   Sit to Stand Details (indicate cue type and reason) tactile, manual & verbal cues on wt shift & technique   Stand to Sit 4: Min guard;With upper extremity assist;With armrests;To chair/3-in-1;5: Supervision  supervision sink to w/c   Stand to Sit Details (indicate cue type and reason) Verbal cues for sequencing;Verbal cues for technique;Verbal cues for precautions/safety   Stand to Sit Details verbal cues on technique   Stand Pivot Transfers --  Ambulation/Gait   Ambulation/Gait Yes   Ambulation/Gait Assistance 4: Min assist   Ambulation/Gait Assistance Details cues on posture/ position within RW, step length, hip /knee ext in stance   Ambulation Distance (Feet) 20 Feet  20' X 2   Assistive device Prostheses;Rolling walker   Gait Pattern Step-to pattern;Decreased step length - right;Decreased step length - left;Decreased stride length;Decreased hip/knee flexion - right;Decreased hip/knee flexion - left;Right flexed knee in stance;Left flexed knee in stance;Lateral hip instability;Trunk flexed;Poor foot clearance - left;Poor foot clearance - right   Ambulation Surface Indoor;Level   Prosthetics   Prosthetic Care Comments  reported not actually wearing prostheses but once per day, PT instructed in need to progress to all awake hours to improve functional potential   Current prosthetic wear tolerance (days/week)  7 days /wk   Current prosthetic wear tolerance (#hours/day)  currently 5hrs 1x/day PT instructed to increase    Residual limb condition  no issues noted   Education Provided Residual limb care;Correct ply sock adjustment;Proper Donning;Proper Doffing;Proper wear schedule/adjustment   Person(s) Educated Patient;Child(ren)   Education  Method Explanation;Tactile cues;Demonstration;Verbal cues   Education Method Verbalized understanding;Returned demonstration;Tactile cues required;Verbal cues required;Needs further instruction   Donning Prosthesis Supervision   Doffing Prosthesis Supervision                PT Education - 09/29/14 0930    Education provided Yes   Education Details standing at sink, risk of osteoporosis with dialysis and risk of compression fractures & hip fractures. PT demo why proper positioning to sit to decrease risk.   Person(s) Educated Patient;Child(ren)   Methods Explanation;Demonstration;Verbal cues   Comprehension Verbalized understanding;Verbal cues required;Need further instruction          PT Short Term Goals - 09/22/14 0845    PT SHORT TERM GOAL #1   Title Patient donnes prostheses with cues only. (Target Date: 09/16/2014)   Baseline MET 09/22/2014   Status Achieved   PT SHORT TERM GOAL #2   Title Patient tolerates bilateral prostheses wear >6hrs total per day without skin issues or limb pain.  (Target Date: 09/16/2014)   Baseline MET 09/22/2014   Status Achieved   PT SHORT TERM GOAL #3   Title Squat-pivot transfer w/c to level mat modified independent with buttock clearance.  (Target Date: 09/16/2014)   Baseline 09/22/2014 MET with general cues to lift buttocks more   Status Achieved   PT SHORT TERM GOAL #4   Title Sit to / from stand w/c to parallel bars with moderate assist  (Target Date: 09/16/2014)   Baseline MET 09/13/2014   Status Achieved   PT SHORT TERM GOAL #5   Title Patient ambulates 10' in parallel bars with prostheses with max assist of 1 person with 2nd person supervising for safety.  (Target Date: 09/16/2014)   Baseline MET 09/13/2014   Time 1   Period Months   Status Achieved   Additional Short Term Goals   Additional Short Term Goals Yes   PT SHORT TERM GOAL #6   Title Patient tolerates wear of prostheses >90% of awake hours, at dialysis may remove prostheses for  comfort, with PT guidance cues. (Target Date: 10/14/2014)   Time 4   Period Weeks   Status New   PT SHORT TERM GOAL #7   Title Patient performs stand-pivot transfers w/c to chair with armrests with RW & prostheses with minimal assist. (Target Date: 10/14/2014)   Time 4   Period Weeks   Status New  PT SHORT TERM GOAL #8   Title Patient ambulates 33' with RW & prostheses including turning to sit in chair with armrests with MinA. (Target Date: 10/14/2014)   Time 4   Period Weeks   Status New           PT Long Term Goals - 08/18/14 1445    PT LONG TERM GOAL #1   Title Tolerates wear of prostheses >90% of awake hours without change in skin integrity nor undue tenderness.  (Target Date: 12/16/2014)   Time 4   Period Months   Status New   PT LONG TERM GOAL #2   Title Patient demonstrates / verbalizes proper prosthetic care for safe use of prostheses without issues.  (Target Date: 12/16/2014)   Time 4   Period Months   Status New   PT LONG TERM GOAL #3   Title Sit to /from stand and stand-pivot transfers with RW & prostheses with minimal assist.  (Target Date: 12/16/2014)   Time 4   Period Months   Status New   PT LONG TERM GOAL #4   Title Standing balance with RW & prostheses manages clothes & reaches 5" with supervision.  (Target Date: 12/16/2014)   Time 4   Period Months   Status New   PT LONG TERM GOAL #5   Title Patient ambulates 8' with RW & prostheses with minimal assist.  (Target Date: 12/16/2014)   Time 4   Period Months   Status New               Plan - 09/29/14 0930    Clinical Impression Statement Patient improved sit to stand at sink with instruction & repetition. He appears to understand need to increase wear to increase functional potential.   Pt will benefit from skilled therapeutic intervention in order to improve on the following deficits Abnormal gait;Decreased activity tolerance;Decreased balance;Decreased endurance;Decreased knowledge of  precautions;Decreased knowledge of use of DME;Decreased mobility;Decreased range of motion;Decreased strength;Postural dysfunction;Prosthetic Dependency   Rehab Potential Good   Clinical Impairments Affecting Rehab Potential Dialysis limits strength and endurance with slow gains.    PT Frequency 2x / week   PT Duration Other (comment)  17 weeks (4 months - 120 days)   PT Treatment/Interventions ADLs/Self Care Home Management;DME Instruction;Gait training;Functional mobility training;Therapeutic activities;Therapeutic exercise;Balance training;Neuromuscular re-education;Patient/family education;Prosthetic Training;Passive range of motion   PT Next Visit Plan  continue with standing & gait with RW including turning to sit in chairs with armrests. continue with lower extremity strengthening.   Consulted and Agree with Plan of Care Patient;Family member/caregiver   Family Member Consulted dtr        Problem List Patient Active Problem List   Diagnosis Date Noted  . Complications, amputation stump late 05/20/2014  . Weakness generalized 04/07/2014  . Sepsis 04/07/2014  . Decubitus ulcer, stage II 04/07/2014  . Decubitus ulcer of ankle   . Fatigue   . Wound infection 04/06/2014  . Decubitus ulcer of sacral region, stage 2 03/07/2014  . Glaucoma 03/07/2014  . Gout 03/07/2014  . Below knee amputation status 02/18/2014  . Osteomyelitis 01/04/2014  . Phantom limb 12/14/2013  . Type 1 diabetes mellitus with diabetic foot infection   . Diabetes mellitus with peripheral vascular disease   . Asthma 05/17/2013  . History of MRSA infection 04/22/2013  . Peripheral vascular disease 11/19/2012  . End-stage renal disease on hemodialysis 05/05/2012  . Kahler disease 03/30/2012  . Anemia in chronic kidney disease 03/19/2012  . Hypothyroidism  03/19/2012  . Anemia, iron deficiency 03/19/2012  . Hypertension 03/18/2012  . Chronic kidney disease (CKD), stage V 03/18/2012  . Cardiac conduction  disorder 03/18/2012  . MGUS (monoclonal gammopathy of unknown significance) 02/28/2011  . Monoclonal paraproteinemia 02/28/2011    WALDRON,ROBIN PT, DPT 09/30/2014, 12:14 PM  [facility] [address] [place], [place], [address] Phone: [phone]   Fax:  [phone]

## 2014-10-04 ENCOUNTER — Encounter: Payer: Medicare Other | Admitting: Physical Therapy

## 2014-10-04 ENCOUNTER — Ambulatory Visit: Payer: Medicare Other | Admitting: Physical Therapy

## 2014-10-04 ENCOUNTER — Encounter: Payer: Self-pay | Admitting: Physical Therapy

## 2014-10-04 DIAGNOSIS — R269 Unspecified abnormalities of gait and mobility: Secondary | ICD-10-CM

## 2014-10-04 DIAGNOSIS — R2689 Other abnormalities of gait and mobility: Secondary | ICD-10-CM

## 2014-10-04 DIAGNOSIS — R2681 Unsteadiness on feet: Secondary | ICD-10-CM

## 2014-10-04 DIAGNOSIS — R29898 Other symptoms and signs involving the musculoskeletal system: Secondary | ICD-10-CM

## 2014-10-04 DIAGNOSIS — R6889 Other general symptoms and signs: Secondary | ICD-10-CM

## 2014-10-04 NOTE — Therapy (Signed)
[facility] [address] [place], [place], [address] Phone: [phone]   Fax:  [phone]  Physical Therapy Treatment  Patient Details  Name: Jack [name] MRN: [id number] Date of Birth: [birth year] Referring Provider:  Lucianne Lei, MD  Encounter Date: 10/04/2014      PT End of Session - 10/04/14 0806    Visit Number 12   Number of Visits 35   Date for PT Re-Evaluation 10/17/14   Authorization Type G-Code every 10 visits   PT Start Time 0802   PT Stop Time 0845   PT Time Calculation (min) 43 min   Equipment Utilized During Treatment Gait belt   Activity Tolerance Patient limited by fatigue;Patient tolerated treatment well   Behavior During Therapy [facility] for tasks assessed/performed      Past Medical History  Diagnosis Date  . Hypertension   . Pneumonia     2012  . Heart murmur   . Glaucoma   . Multiple myeloma, without mention of having achieved remission 03/30/2012    Cytogenetic neg on 03/23/2012.  . Asthma   . Hyperparathyroidism, secondary renal   . Peripheral arterial disease   . End-stage renal disease on hemodialysis     Started HD March 2014.  Cause of ESRD was DM.  Gets HD at [facility] on [place] on MWF schedule.   . Thyroid disease     hyperparathyroidism  . MRSA bacteremia   . Anemia   . Peripheral vascular disease, unspecified 11/19/2012    In the past had R foot toe amps then R TMA. In 2015 had left foot toe amp > then TMA >then L BKA on 05/14/13   . History of MRSA infection 04/22/2013    Bacteremia assoc w L foot wound infection Mar 2015   . Gangrene of foot   . ESRD on hemodialysis   . Diabetes mellitus with peripheral vascular disease   . Hypothyroidism   . Decubitus ulcer of sacral region, stage 2 03/07/2014  . Glaucoma 03/07/2014  . Gangrene     right BKA  . Contracture of joint     left knee    Past Surgical History  Procedure Laterality Date  . Thyroidectomy    . Cervical  disc surgery    . Insertion of dialysis catheter Right 03/19/2012    Procedure: INSERTION OF DIALYSIS CATHETER;  Surgeon: Mal Misty, MD;  Location: [place];  Service: Vascular;  Laterality: Right;  Right Internal Jugular  . Av fistula placement Left 03/25/2012    Procedure: ARTERIOVENOUS (AV) FISTULA CREATION;  Surgeon: Mal Misty, MD;  Location: [place];  Service: Vascular;  Laterality: Left;  . Ligation of competing branches of arteriovenous fistula Left 05/08/2012    Procedure: LIGATION OF COMPETING BRANCHES OF ARTERIOVENOUS FISTULA;  Surgeon: Mal Misty, MD;  Location: [place];  Service: Vascular;  Laterality: Left;  Ultrasound guided  . Cardiac catheterization      approx 30 years ago  . Amputation Right 11/10/2012    Procedure: AMPUTATION FIRST and SECOND TOES Right Foot;  Surgeon: Elam Dutch, MD;  Location: [facility] OR;  Service: Vascular;  Laterality: Right;  . Transmetatarsal amputation Left 12/16/2012    Procedure: TRANSMETATARSAL AMPUTATION AND VAC PLACEMENT;  Surgeon: Elam Dutch, MD;  Location: [place];  Service: Vascular;  Laterality: Left;  . Amputation Left 04/07/2013    Procedure: AMPUTATION DIGIT- LEFT 1ST TOE;  Surgeon: Mal Misty, MD;  Location: [place];  Service: Vascular;  Laterality: Left;  . Tee without cardioversion N/A 04/20/2013    Procedure: TRANSESOPHAGEAL ECHOCARDIOGRAM (TEE);  Surgeon: Josue Hector, MD;  Location: [facility] ENDOSCOPY;  Service: Cardiovascular;  Laterality: N/A;  . I&d extremity Left 04/22/2013    Procedure: [id number] DGL DEBRIDEMENT LEFT FIRST TOE AMPUTATION WOUND ;  Surgeon: Mal Misty, MD;  Location: [place];  Service: Vascular;  Laterality: Left;  . Amputation Left 04/26/2013    Procedure: Left Foot Transmetatarsal Amputation;  Surgeon: Newt Minion, MD;  Location: [place];  Service: Orthopedics;  Laterality: Left;  . Toe amputation      D/C 04-30-13  . Below knee leg amputation Left 05/14/2013    DR [name]  . Amputation Left 05/14/2013     Procedure: AMPUTATION BELOW KNEE;  Surgeon: Newt Minion, MD;  Location: [place];  Service: Orthopedics;  Laterality: Left;  Left Below Knee Amputation  . Eye surgery Bilateral     CATARACTS  . Amputation Left 07/23/2013    Procedure: AMPUTATION BELOW KNEE;  Surgeon: Newt Minion, MD;  Location: [place];  Service: Orthopedics;  Laterality: Left;  Left Below Knee Amputation Revision  . Abdominal aortagram Bilateral 11/06/2012    Procedure: ABDOMINAL AORTAGRAM;  Surgeon: Elam Dutch, MD;  Location: [facility] CATH LAB;  Service: Cardiovascular;  Laterality: Bilateral;  . I&d extremity Right 01/05/2014    Procedure: IRRIGATION AND DEBRIDEMENT Right Heel Ulcer;  Surgeon: Mcarthur Rossetti, MD;  Location: [place];  Service: Orthopedics;  Laterality: Right;  Surgeon Available after 5PM  . Below knee leg amputation Right 02/18/2014    dr duda  . Amputation Right 02/18/2014    Procedure: AMPUTATION BELOW KNEE;  Surgeon: Newt Minion, MD;  Location: [place];  Service: Orthopedics;  Laterality: Right;  . Stump revision Right 05/20/2014    Procedure: Revision Right Below Knee Amputation;  Surgeon: Newt Minion, MD;  Location: [place];  Service: Orthopedics;  Laterality: Right;  . Repair quadriceps/hamstring muscles Left 05/20/2014    Procedure: Left Hamstring Release;  Surgeon: Newt Minion, MD;  Location: [place];  Service: Orthopedics;  Laterality: Left;    There were no vitals filed for this visit.  Visit Diagnosis:  Weakness of both legs  Decreased functional activity tolerance  Balance problems  Abnormality of gait  Unsteadiness  Activity intolerance      Subjective Assessment - 10/04/14 0805    Subjective No new complaints. No falls. Walking some at home.   Currently in Pain? No/denies            [facility] Adult PT Treatment/Exercise - 10/04/14 0806    Transfers   Sit to Stand 4: Min assist;With upper extremity assist;With armrests;From chair/3-in-1;5: Supervision   Sit to Stand Details  Verbal cues for safe use of DME/AE;Verbal cues for technique;Verbal cues for sequencing   Stand to Sit 4: Min guard;With upper extremity assist;With armrests;To chair/3-in-1;5: Supervision   Stand to Sit Details (indicate cue type and reason) Verbal cues for safe use of DME/AE;Verbal cues for precautions/safety;Verbal cues for technique   Stand Pivot Transfers 4: Min assist   Stand Pivot Transfer Details (indicate cue type and reason) wheelchair<>chair with armrests: using sink x 1, walker remaining reps (x 2 each way). cues on technique, sequence and walker position/used with transfers. increased assist needed as progressed, from min assit to mod/max assist due to fatigue.  Ambulation/Gait   Ambulation/Gait Yes   Ambulation/Gait Assistance 4: Min assist   Ambulation/Gait Assistance Details cues on posture, walker use/position with gait and to correct gait deviations.   Ambulation Distance (Feet) 20 Feet  x 1; 45 ft x1   Assistive device Prostheses;Rolling walker   Gait Pattern Step-to pattern;Decreased step length - right;Decreased step length - left;Decreased stride length;Decreased hip/knee flexion - right;Decreased hip/knee flexion - left;Right flexed knee in stance;Left flexed knee in stance;Lateral hip instability;Trunk flexed;Poor foot clearance - left;Poor foot clearance - right   Ambulation Surface Level;Indoor   Prosthetics   Prosthetic Care Comments  has increased his wear times per pt and daughter.    Current prosthetic wear tolerance (days/week)  7 days /wk   Current prosthetic wear tolerance (#hours/day)  5 hours 2 x day   Residual limb condition  no issues reported   Education Provided Correct ply sock adjustment;Proper wear schedule/adjustment;Residual limb care;Proper weight-bearing schedule/adjustment   Person(s) Educated Patient;Child(ren)  daughter   Education Method Explanation;Verbal cues   Education Method Verbalized understanding;Verbal  cues required   Donning Prosthesis Supervision   Doffing Prosthesis Supervision           PT Short Term Goals - 09/22/14 0845    PT SHORT TERM GOAL #1   Title Patient donnes prostheses with cues only. (Target Date: 09/16/2014)   Baseline MET 09/22/2014   Status Achieved   PT SHORT TERM GOAL #2   Title Patient tolerates bilateral prostheses wear >6hrs total per day without skin issues or limb pain.  (Target Date: 09/16/2014)   Baseline MET 09/22/2014   Status Achieved   PT SHORT TERM GOAL #3   Title Squat-pivot transfer w/c to level mat modified independent with buttock clearance.  (Target Date: 09/16/2014)   Baseline 09/22/2014 MET with general cues to lift buttocks more   Status Achieved   PT SHORT TERM GOAL #4   Title Sit to / from stand w/c to parallel bars with moderate assist  (Target Date: 09/16/2014)   Baseline MET 09/13/2014   Status Achieved   PT SHORT TERM GOAL #5   Title Patient ambulates 10' in parallel bars with prostheses with max assist of 1 person with 2nd person supervising for safety.  (Target Date: 09/16/2014)   Baseline MET 09/13/2014   Time 1   Period Months   Status Achieved   Additional Short Term Goals   Additional Short Term Goals Yes   PT SHORT TERM GOAL #6   Title Patient tolerates wear of prostheses >90% of awake hours, at dialysis may remove prostheses for comfort, with PT guidance cues. (Target Date: 10/14/2014)   Time 4   Period Weeks   Status New   PT SHORT TERM GOAL #7   Title Patient performs stand-pivot transfers w/c to chair with armrests with RW & prostheses with minimal assist. (Target Date: 10/14/2014)   Time 4   Period Weeks   Status New   PT SHORT TERM GOAL #8   Title Patient ambulates 43' with RW & prostheses including turning to sit in chair with armrests with MinA. (Target Date: 10/14/2014)   Time 4   Period Weeks   Status New           PT Long Term Goals - 08/18/14 1445    PT LONG TERM GOAL #1   Title Tolerates wear of prostheses >90%  of awake hours without change in skin integrity nor undue tenderness.  (Target Date: 12/16/2014)   Time 4  Period Months   Status New   PT LONG TERM GOAL #2   Title Patient demonstrates / verbalizes proper prosthetic care for safe use of prostheses without issues.  (Target Date: 12/16/2014)   Time 4   Period Months   Status New   PT LONG TERM GOAL #3   Title Sit to /from stand and stand-pivot transfers with RW & prostheses with minimal assist.  (Target Date: 12/16/2014)   Time 4   Period Months   Status New   PT LONG TERM GOAL #4   Title Standing balance with RW & prostheses manages clothes & reaches 5" with supervision.  (Target Date: 12/16/2014)   Time 4   Period Months   Status New   PT LONG TERM GOAL #5   Title Patient ambulates 74' with RW & prostheses with minimal assist.  (Target Date: 12/16/2014)   Time 4   Period Months   Status New           Plan - 10/04/14 0806    Clinical Impression Statement Pt with improved stand pivot transfers with walker/prostheses after practice/repetition today until fatigued, then needed increased assistance. Pt making steady progress toward goals, did increase gait distance today.   Pt will benefit from skilled therapeutic intervention in order to improve on the following deficits Abnormal gait;Decreased activity tolerance;Decreased balance;Decreased endurance;Decreased knowledge of precautions;Decreased knowledge of use of DME;Decreased mobility;Decreased range of motion;Decreased strength;Postural dysfunction;Prosthetic Dependency   Rehab Potential Good   Clinical Impairments Affecting Rehab Potential Dialysis limits strength and endurance with slow gains.    PT Frequency 2x / week   PT Duration Other (comment)  17 weeks (4 months - 120 days)   PT Treatment/Interventions ADLs/Self Care Home Management;DME Instruction;Gait training;Functional mobility training;Therapeutic activities;Therapeutic exercise;Balance training;Neuromuscular  re-education;Patient/family education;Prosthetic Training;Passive range of motion   PT Next Visit Plan continue with gait and transfers with walker/prostheses. continue with lower extremity strengthening   Consulted and Agree with Plan of Care Patient;Family member/caregiver   Family Member Consulted dtr        Problem List Patient Active Problem List   Diagnosis Date Noted  . Complications, amputation stump late 05/20/2014  . Weakness generalized 04/07/2014  . Sepsis 04/07/2014  . Decubitus ulcer, stage II 04/07/2014  . Decubitus ulcer of ankle   . Fatigue   . Wound infection 04/06/2014  . Decubitus ulcer of sacral region, stage 2 03/07/2014  . Glaucoma 03/07/2014  . Gout 03/07/2014  . Below knee amputation status 02/18/2014  . Osteomyelitis 01/04/2014  . Phantom limb 12/14/2013  . Type 1 diabetes mellitus with diabetic foot infection   . Diabetes mellitus with peripheral vascular disease   . Asthma 05/17/2013  . History of MRSA infection 04/22/2013  . Peripheral vascular disease 11/19/2012  . End-stage renal disease on hemodialysis 05/05/2012  . Kahler disease 03/30/2012  . Anemia in chronic kidney disease 03/19/2012  . Hypothyroidism 03/19/2012  . Anemia, iron deficiency 03/19/2012  . Hypertension 03/18/2012  . Chronic kidney disease (CKD), stage V 03/18/2012  . Cardiac conduction disorder 03/18/2012  . MGUS (monoclonal gammopathy of unknown significance) 02/28/2011  . Monoclonal paraproteinemia 02/28/2011    Willow Ora 10/04/2014, 4:48 PM  Willow Ora, PTA, [facility] [address] [place], [place] [address] [phone] 10/04/2014, 4:48 PM

## 2014-10-06 ENCOUNTER — Ambulatory Visit: Payer: Medicare Other | Admitting: Physical Therapy

## 2014-10-11 ENCOUNTER — Ambulatory Visit: Payer: Medicare Other | Admitting: Physical Therapy

## 2014-10-11 ENCOUNTER — Encounter: Payer: Self-pay | Admitting: Physical Therapy

## 2014-10-11 DIAGNOSIS — Z4789 Encounter for other orthopedic aftercare: Secondary | ICD-10-CM

## 2014-10-11 DIAGNOSIS — R269 Unspecified abnormalities of gait and mobility: Secondary | ICD-10-CM

## 2014-10-11 DIAGNOSIS — R2681 Unsteadiness on feet: Secondary | ICD-10-CM

## 2014-10-11 DIAGNOSIS — Z89511 Acquired absence of right leg below knee: Secondary | ICD-10-CM

## 2014-10-11 DIAGNOSIS — R6889 Other general symptoms and signs: Secondary | ICD-10-CM

## 2014-10-11 DIAGNOSIS — R29898 Other symptoms and signs involving the musculoskeletal system: Secondary | ICD-10-CM

## 2014-10-11 DIAGNOSIS — Z89512 Acquired absence of left leg below knee: Secondary | ICD-10-CM

## 2014-10-11 NOTE — Therapy (Signed)
[facility] [address] [place], [place], [address] Phone: [phone]   Fax:  [phone]  Physical Therapy Treatment  Patient Details  Name: Jack [name] MRN: [id number] Date of Birth: 06-02-1945 Referring Provider:  Lucianne Lei, MD  Encounter Date: 10/11/2014      PT End of Session - 10/11/14 0937    Visit Number 13   Number of Visits 35   Date for PT Re-Evaluation 10/17/14   Authorization Type G-Code every 10 visits   PT Start Time 0931   PT Stop Time 1010   PT Time Calculation (min) 39 min   Equipment Utilized During Treatment Gait belt   Activity Tolerance Patient limited by fatigue;Patient tolerated treatment well   Behavior During Therapy [facility] for tasks assessed/performed      Past Medical History  Diagnosis Date  . Hypertension   . Pneumonia     2012  . Heart murmur   . Glaucoma   . Multiple myeloma, without mention of having achieved remission 03/30/2012    Cytogenetic neg on 03/23/2012.  . Asthma   . Hyperparathyroidism, secondary renal   . Peripheral arterial disease   . End-stage renal disease on hemodialysis     Started HD March 2014.  Cause of ESRD was DM.  Gets HD at [facility] on [address] on MWF schedule.   . Thyroid disease     hyperparathyroidism  . MRSA bacteremia   . Anemia   . Peripheral vascular disease, unspecified 11/19/2012    In the past had R foot toe amps then R TMA. In 2015 had left foot toe amp > then TMA >then L BKA on 05/14/13   . History of MRSA infection 04/22/2013    Bacteremia assoc w L foot wound infection Mar 2015   . Gangrene of foot   . ESRD on hemodialysis   . Diabetes mellitus with peripheral vascular disease   . Hypothyroidism   . Decubitus ulcer of sacral region, stage 2 03/07/2014  . Glaucoma 03/07/2014  . Gangrene     right BKA  . Contracture of joint     left knee    Past Surgical History  Procedure Laterality Date  . Thyroidectomy    . Cervical  disc surgery    . Insertion of dialysis catheter Right 03/19/2012    Procedure: INSERTION OF DIALYSIS CATHETER;  Surgeon: Mal Misty, MD;  Location: [facility];  Service: Vascular;  Laterality: Right;  Right Internal Jugular  . Av fistula placement Left 03/25/2012    Procedure: ARTERIOVENOUS (AV) FISTULA CREATION;  Surgeon: Mal Misty, MD;  Location: [facility];  Service: Vascular;  Laterality: Left;  . Ligation of competing branches of arteriovenous fistula Left 05/08/2012    Procedure: LIGATION OF COMPETING BRANCHES OF ARTERIOVENOUS FISTULA;  Surgeon: Mal Misty, MD;  Location: [facility];  Service: Vascular;  Laterality: Left;  Ultrasound guided  . Cardiac catheterization      approx 30 years ago  . Amputation Right 11/10/2012    Procedure: AMPUTATION FIRST and SECOND TOES Right Foot;  Surgeon: Elam Dutch, MD;  Location: [facility] OR;  Service: Vascular;  Laterality: Right;  . Transmetatarsal amputation Left 12/16/2012    Procedure: TRANSMETATARSAL AMPUTATION AND VAC PLACEMENT;  Surgeon: Elam Dutch, MD;  Location: [facility];  Service: Vascular;  Laterality: Left;  . Amputation Left 04/07/2013    Procedure: AMPUTATION DIGIT- LEFT 1ST TOE;  Surgeon: Mal Misty, MD;  Location: [facility];  Service: Vascular;  Laterality: Left;  . Tee without cardioversion N/A 04/20/2013    Procedure: TRANSESOPHAGEAL ECHOCARDIOGRAM (TEE);  Surgeon: Josue Hector, MD;  Location: [facility] ENDOSCOPY;  Service: Cardiovascular;  Laterality: N/A;  . I&d extremity Left 04/22/2013    Procedure: [id number] OIB DEBRIDEMENT LEFT FIRST TOE AMPUTATION WOUND ;  Surgeon: Mal Misty, MD;  Location: [facility];  Service: Vascular;  Laterality: Left;  . Amputation Left 04/26/2013    Procedure: Left Foot Transmetatarsal Amputation;  Surgeon: Newt Minion, MD;  Location: [facility];  Service: Orthopedics;  Laterality: Left;  . Toe amputation      D/C 04-30-13  . Below knee leg amputation Left 05/14/2013    DR [name]  . Amputation Left 05/14/2013     Procedure: AMPUTATION BELOW KNEE;  Surgeon: Newt Minion, MD;  Location: [facility];  Service: Orthopedics;  Laterality: Left;  Left Below Knee Amputation  . Eye surgery Bilateral     CATARACTS  . Amputation Left 07/23/2013    Procedure: AMPUTATION BELOW KNEE;  Surgeon: Newt Minion, MD;  Location: [facility];  Service: Orthopedics;  Laterality: Left;  Left Below Knee Amputation Revision  . Abdominal aortagram Bilateral 11/06/2012    Procedure: ABDOMINAL AORTAGRAM;  Surgeon: Elam Dutch, MD;  Location: [facility];  Service: Cardiovascular;  Laterality: Bilateral;  . I&d extremity Right 01/05/2014    Procedure: IRRIGATION AND DEBRIDEMENT Right Heel Ulcer;  Surgeon: Mcarthur Rossetti, MD;  Location: [facility];  Service: Orthopedics;  Laterality: Right;  Surgeon Available after 5PM  . Below knee leg amputation Right 02/18/2014    dr duda  . Amputation Right 02/18/2014    Procedure: AMPUTATION BELOW KNEE;  Surgeon: Newt Minion, MD;  Location: [facility];  Service: Orthopedics;  Laterality: Right;  . Stump revision Right 05/20/2014    Procedure: Revision Right Below Knee Amputation;  Surgeon: Newt Minion, MD;  Location: [facility];  Service: Orthopedics;  Laterality: Right;  . Repair quadriceps/hamstring muscles Left 05/20/2014    Procedure: Left Hamstring Release;  Surgeon: Newt Minion, MD;  Location: [facility];  Service: Orthopedics;  Laterality: Left;    There were no vitals filed for this visit.  Visit Diagnosis:  Weakness of both legs  Decreased functional activity tolerance  Abnormality of gait  Activity intolerance  Encounter for prosthetic gait training  Status post bilateral below knee amputation  Unsteadiness      Subjective Assessment - 10/11/14 0936    Subjective No new complaints. No falls. Walking some at home.   Currently in Pain? No/denies            [facility] Adult PT Treatment/Exercise - 10/11/14 0938    Transfers   Sit to Stand 4: Min assist;With upper extremity assist;With  armrests;From chair/3-in-1;5: Supervision   Sit to Stand Details Verbal cues for safe use of DME/AE;Verbal cues for technique;Verbal cues for sequencing   Stand to Sit 4: Min guard;With upper extremity assist;With armrests;To chair/3-in-1;5: Supervision   Stand to Sit Details (indicate cue type and reason) Verbal cues for safe use of DME/AE;Verbal cues for precautions/safety;Verbal cues for technique   Stand Pivot Transfers 4: Min assist   Stand Pivot Transfer Details (indicate cue type and reason) wheelchair<>standard chair with arm rests x 1 each way, cues on posture, sequence and walker position with transfers   Ambulation/Gait   Ambulation/Gait Yes   Ambulation/Gait Assistance 4: Min assist   Ambulation/Gait Assistance Details cues on posture,step length, to increase  base of support and for foot placement with intial contact   Ambulation Distance (Feet) 20 Feet  x2, 25 feet x1   Assistive device Prostheses;Rolling walker   Gait Pattern Step-to pattern;Decreased step length - right;Decreased step length - left;Decreased stride length;Decreased hip/knee flexion - right;Decreased hip/knee flexion - left;Right flexed knee in stance;Left flexed knee in stance;Lateral hip instability;Trunk flexed;Poor foot clearance - left;Poor foot clearance - right   Ambulation Surface Level;Indoor   Prosthetics   Prosthetic Care Comments  pt with increased right knee pain with weight bearing, improved some with additional sock ply's added. Pt still with some discomfort even after good fit found with sock ply. Need to check alingment next session from a distance (did not have second person to safely do this today).                                    Current prosthetic wear tolerance (days/week)  7 days /wk   Current prosthetic wear tolerance (#hours/day)  3-4 hours 2 x day   Residual limb condition  no issues reported   Education Provided Residual limb care;Correct ply sock adjustment;Proper weight-bearing  schedule/adjustment;Proper wear schedule/adjustment   Person(s) Educated Patient;Child(ren)  daughter   Education Method Explanation;Verbal cues   Education Method Verbalized understanding;Verbal cues required;Needs further instruction   Donning Prosthesis Supervision   Doffing Prosthesis Supervision           PT Short Term Goals - 10/11/14 9147    PT SHORT TERM GOAL #1   Title Patient donnes prostheses with cues only. (Target Date: 09/16/2014)   Baseline MET 09/22/2014   Status Achieved   PT SHORT TERM GOAL #2   Title Patient tolerates bilateral prostheses wear >6hrs total per day without skin issues or limb pain.  (Target Date: 09/16/2014)   Baseline MET 09/22/2014   Status Achieved   PT SHORT TERM GOAL #3   Title Squat-pivot transfer w/c to level mat modified independent with buttock clearance.  (Target Date: 09/16/2014)   Baseline 09/22/2014 MET with general cues to lift buttocks more   Status Achieved   PT SHORT TERM GOAL #4   Title Sit to / from stand w/c to parallel bars with moderate assist  (Target Date: 09/16/2014)   Baseline MET 09/13/2014   Status Achieved   PT SHORT TERM GOAL #5   Title Patient ambulates 10' in parallel bars with prostheses with max assist of 1 person with 2nd person supervising for safety.  (Target Date: 09/16/2014)   Baseline MET 09/13/2014   Time 1   Period Months   Status Achieved   PT SHORT TERM GOAL #6   Title Patient tolerates wear of prostheses >90% of awake hours, at dialysis may remove prostheses for comfort, with PT guidance cues. (Target Date: 10/14/2014)   Baseline 10/11/14: pt has increased his wear time since last goal check, however only up to about 75-80% of awake hours on consistent basis.    Time --   Period --   Status Not Met   PT SHORT TERM GOAL #7   Title Patient performs stand-pivot transfers w/c to chair with armrests with RW & prostheses with minimal assist. (Target Date: 10/14/2014)   Baseline 10/11/14: met today   Time --   Period --    Status Achieved   PT SHORT TERM GOAL #8   Title Patient ambulates 77' with RW & prostheses including turning to sit  in chair with armrests with MinA. (Target Date: 10/14/2014)   Baseline 10/11/14: met distance, did not turn to sit as chair was behind him. need to assess that next session   Time --   Period --   Status Partially Met           PT Long Term Goals - 08/18/14 1445    PT LONG TERM GOAL #1   Title Tolerates wear of prostheses >90% of awake hours without change in skin integrity nor undue tenderness.  (Target Date: 12/16/2014)   Time 4   Period Months   Status New   PT LONG TERM GOAL #2   Title Patient demonstrates / verbalizes proper prosthetic care for safe use of prostheses without issues.  (Target Date: 12/16/2014)   Time 4   Period Months   Status New   PT LONG TERM GOAL #3   Title Sit to /from stand and stand-pivot transfers with RW & prostheses with minimal assist.  (Target Date: 12/16/2014)   Time 4   Period Months   Status New   PT LONG TERM GOAL #4   Title Standing balance with RW & prostheses manages clothes & reaches 5" with supervision.  (Target Date: 12/16/2014)   Time 4   Period Months   Status New   PT LONG TERM GOAL #5   Title Patient ambulates 37' with RW & prostheses with minimal assist.  (Target Date: 12/16/2014)   Time 4   Period Months   Status New           Plan - 10/11/14 7062    Clinical Impression Statement Pt met 1/3 STG's, with another one partially met. Still having issues with getting prosthesis comfortable on right side with weight bearing. Pt making steady progress toward goals.   Pt will benefit from skilled therapeutic intervention in order to improve on the following deficits Abnormal gait;Decreased activity tolerance;Decreased balance;Decreased endurance;Decreased knowledge of precautions;Decreased knowledge of use of DME;Decreased mobility;Decreased range of motion;Decreased strength;Postural dysfunction;Prosthetic Dependency    Rehab Potential Good   Clinical Impairments Affecting Rehab Potential Dialysis limits strength and endurance with slow gains.    PT Frequency 2x / week   PT Duration Other (comment)  17 weeks (4 months - 120 days)   PT Treatment/Interventions ADLs/Self Care Home Management;DME Instruction;Gait training;Functional mobility training;Therapeutic activities;Therapeutic exercise;Balance training;Neuromuscular re-education;Patient/family education;Prosthetic Training;Passive range of motion   PT Next Visit Plan assess remaining STG's. If second person available, assess gait from a distance to look at prosthetic alignment with gait as possible reason for right leg pain with gait;continue toward LTG's.   Consulted and Agree with Plan of Care Patient;Family member/caregiver   Family Member Consulted dtr        Problem List Patient Active Problem List   Diagnosis Date Noted  . Complications, amputation stump late 05/20/2014  . Weakness generalized 04/07/2014  . Sepsis 04/07/2014  . Decubitus ulcer, stage II 04/07/2014  . Decubitus ulcer of ankle   . Fatigue   . Wound infection 04/06/2014  . Decubitus ulcer of sacral region, stage 2 03/07/2014  . Glaucoma 03/07/2014  . Gout 03/07/2014  . Below knee amputation status 02/18/2014  . Osteomyelitis 01/04/2014  . Phantom limb 12/14/2013  . Type 1 diabetes mellitus with diabetic foot infection   . Diabetes mellitus with peripheral vascular disease   . Asthma 05/17/2013  . History of MRSA infection 04/22/2013  . Peripheral vascular disease 11/19/2012  . End-stage renal disease on hemodialysis 05/05/2012  .  Kahler disease 03/30/2012  . Anemia in chronic kidney disease 03/19/2012  . Hypothyroidism 03/19/2012  . Anemia, iron deficiency 03/19/2012  . Hypertension 03/18/2012  . Chronic kidney disease (CKD), stage V 03/18/2012  . Cardiac conduction disorder 03/18/2012  . MGUS (monoclonal gammopathy of unknown significance) 02/28/2011  . Monoclonal  paraproteinemia 02/28/2011    Willow Ora 10/11/2014, 12:57 PM  Willow Ora, PTA, [facility] [address] [place], [place] [address] [phone] 10/11/2014, 12:57 PM

## 2014-10-13 ENCOUNTER — Encounter: Payer: Self-pay | Admitting: Physical Therapy

## 2014-10-13 ENCOUNTER — Ambulatory Visit: Payer: Medicare Other | Admitting: Physical Therapy

## 2014-10-13 DIAGNOSIS — Z89512 Acquired absence of left leg below knee: Secondary | ICD-10-CM

## 2014-10-13 DIAGNOSIS — Z89511 Acquired absence of right leg below knee: Secondary | ICD-10-CM

## 2014-10-13 DIAGNOSIS — R29898 Other symptoms and signs involving the musculoskeletal system: Secondary | ICD-10-CM

## 2014-10-13 DIAGNOSIS — R2681 Unsteadiness on feet: Secondary | ICD-10-CM

## 2014-10-13 DIAGNOSIS — R6889 Other general symptoms and signs: Secondary | ICD-10-CM

## 2014-10-13 DIAGNOSIS — R269 Unspecified abnormalities of gait and mobility: Secondary | ICD-10-CM

## 2014-10-13 DIAGNOSIS — R2689 Other abnormalities of gait and mobility: Secondary | ICD-10-CM

## 2014-10-13 DIAGNOSIS — Z4789 Encounter for other orthopedic aftercare: Secondary | ICD-10-CM

## 2014-10-14 NOTE — Therapy (Signed)
[facility] [address] [employer] [place], [place], [address] Phone: [phone]   Fax:  [phone]  Physical Therapy Treatment  Patient Details  Name: Jack [name] MRN: [id number] Date of Birth: 12-Jan-1946 Referring Provider:  Lucianne Lei, MD  Encounter Date: 10/13/2014      PT End of Session - 10/13/14 0930    Visit Number 14   Number of Visits 35   Date for PT Re-Evaluation 10/17/14   Authorization Type G-Code every 10 visits   PT Start Time 0935   PT Stop Time 1015   PT Time Calculation (min) 40 min   Equipment Utilized During Treatment Gait belt   Activity Tolerance Patient limited by fatigue;Patient tolerated treatment well   Behavior During Therapy [facility] for tasks assessed/performed      Past Medical History  Diagnosis Date  . Hypertension   . Pneumonia     2012  . Heart murmur   . Glaucoma   . Multiple myeloma, without mention of having achieved remission 03/30/2012    Cytogenetic neg on 03/23/2012.  . Asthma   . Hyperparathyroidism, secondary renal   . Peripheral arterial disease   . End-stage renal disease on hemodialysis     Started HD March 2014.  Cause of ESRD was DM.  Gets HD at [facility] on [address] on MWF schedule.   . Thyroid disease     hyperparathyroidism  . MRSA bacteremia   . Anemia   . Peripheral vascular disease, unspecified 11/19/2012    In the past had R foot toe amps then R TMA. In 2015 had left foot toe amp > then TMA >then L BKA on 05/14/13   . History of MRSA infection 04/22/2013    Bacteremia assoc w L foot wound infection Mar 2015   . Gangrene of foot   . ESRD on hemodialysis   . Diabetes mellitus with peripheral vascular disease   . Hypothyroidism   . Decubitus ulcer of sacral region, stage 2 03/07/2014  . Glaucoma 03/07/2014  . Gangrene     right BKA  . Contracture of joint     left knee    Past Surgical History  Procedure Laterality Date  . Thyroidectomy    . Cervical  disc surgery    . Insertion of dialysis catheter Right 03/19/2012    Procedure: INSERTION OF DIALYSIS CATHETER;  Surgeon: Mal Misty, MD;  Location: [facility];  Service: Vascular;  Laterality: Right;  Right Internal Jugular  . Av fistula placement Left 03/25/2012    Procedure: ARTERIOVENOUS (AV) FISTULA CREATION;  Surgeon: Mal Misty, MD;  Location: [facility];  Service: Vascular;  Laterality: Left;  . Ligation of competing branches of arteriovenous fistula Left 05/08/2012    Procedure: LIGATION OF COMPETING BRANCHES OF ARTERIOVENOUS FISTULA;  Surgeon: Mal Misty, MD;  Location: [facility];  Service: Vascular;  Laterality: Left;  Ultrasound guided  . Cardiac catheterization      approx 30 years ago  . Amputation Right 11/10/2012    Procedure: AMPUTATION FIRST and SECOND TOES Right Foot;  Surgeon: Elam Dutch, MD;  Location: [facility] OR;  Service: Vascular;  Laterality: Right;  . Transmetatarsal amputation Left 12/16/2012    Procedure: TRANSMETATARSAL AMPUTATION AND VAC PLACEMENT;  Surgeon: Elam Dutch, MD;  Location: [facility];  Service: Vascular;  Laterality: Left;  . Amputation Left 04/07/2013    Procedure: AMPUTATION DIGIT- LEFT 1ST TOE;  Surgeon: Mal Misty, MD;  Location: [facility];  Service: Vascular;  Laterality: Left;  . Tee without cardioversion N/A 04/20/2013    Procedure: TRANSESOPHAGEAL ECHOCARDIOGRAM (TEE);  Surgeon: Josue Hector, MD;  Location: [facility] ENDOSCOPY;  Service: Cardiovascular;  Laterality: N/A;  . I&d extremity Left 04/22/2013    Procedure: OVZCHYIFOY DXA DEBRIDEMENT LEFT FIRST TOE AMPUTATION WOUND ;  Surgeon: Mal Misty, MD;  Location: [facility];  Service: Vascular;  Laterality: Left;  . Amputation Left 04/26/2013    Procedure: Left Foot Transmetatarsal Amputation;  Surgeon: Newt Minion, MD;  Location: [facility];  Service: Orthopedics;  Laterality: Left;  . Toe amputation      D/C 04-30-13  . Below knee leg amputation Left 05/14/2013    DR [name]  . Amputation Left 05/14/2013     Procedure: AMPUTATION BELOW KNEE;  Surgeon: Newt Minion, MD;  Location: [facility];  Service: Orthopedics;  Laterality: Left;  Left Below Knee Amputation  . Eye surgery Bilateral     CATARACTS  . Amputation Left 07/23/2013    Procedure: AMPUTATION BELOW KNEE;  Surgeon: Newt Minion, MD;  Location: [facility];  Service: Orthopedics;  Laterality: Left;  Left Below Knee Amputation Revision  . Abdominal aortagram Bilateral 11/06/2012    Procedure: ABDOMINAL AORTAGRAM;  Surgeon: Elam Dutch, MD;  Location: [facility] CATH LAB;  Service: Cardiovascular;  Laterality: Bilateral;  . I&d extremity Right 01/05/2014    Procedure: IRRIGATION AND DEBRIDEMENT Right Heel Ulcer;  Surgeon: Mcarthur Rossetti, MD;  Location: [facility];  Service: Orthopedics;  Laterality: Right;  Surgeon Available after 5PM  . Below knee leg amputation Right 02/18/2014    dr duda  . Amputation Right 02/18/2014    Procedure: AMPUTATION BELOW KNEE;  Surgeon: Newt Minion, MD;  Location: [facility];  Service: Orthopedics;  Laterality: Right;  . Stump revision Right 05/20/2014    Procedure: Revision Right Below Knee Amputation;  Surgeon: Newt Minion, MD;  Location: [facility];  Service: Orthopedics;  Laterality: Right;  . Repair quadriceps/hamstring muscles Left 05/20/2014    Procedure: Left Hamstring Release;  Surgeon: Newt Minion, MD;  Location: [facility];  Service: Orthopedics;  Laterality: Left;    There were no vitals filed for this visit.  Visit Diagnosis:  Weakness of both legs  Decreased functional activity tolerance  Abnormality of gait  Encounter for prosthetic gait training  Status post bilateral below knee amputation  Unsteadiness  Balance problems      Subjective Assessment - 10/13/14 0948    Subjective (p) He tried to walk with family but it did not go well. No falls.   Currently in Pain? (p) No/denies                         [facility] Adult PT Treatment/Exercise - 10/13/14 0935    Transfers   Sit to Stand 4:  Min assist;With upper extremity assist;With armrests;From chair/3-in-1;4: Min guard   Sit to Stand Details Verbal cues for safe use of DME/AE;Verbal cues for technique;Verbal cues for sequencing   Stand to Sit 4: Min guard;With upper extremity assist;With armrests;To chair/3-in-1;5: Supervision   Stand to Sit Details (indicate cue type and reason) Verbal cues for safe use of DME/AE;Verbal cues for precautions/safety;Verbal cues for technique   Ambulation/Gait   Ambulation/Gait Yes   Ambulation/Gait Assistance 4: Min assist   Ambulation/Gait Assistance Details PT instructed pt to stand up, stretch / posture pushing upward into erect posture to decrease stiffness if sitting >30 minutes prior  to attempting to walk with family. PT cued while walking upright posture and step length   Ambulation Distance (Feet) 55 Feet  55', 25' X 2   Assistive device Prostheses;Rolling walker   Gait Pattern Step-to pattern;Decreased step length - right;Decreased step length - left;Decreased stride length;Decreased hip/knee flexion - right;Decreased hip/knee flexion - left;Right flexed knee in stance;Left flexed knee in stance;Lateral hip instability;Trunk flexed;Poor foot clearance - left;Poor foot clearance - right   Ambulation Surface Indoor;Level;Outdoor;Paved   Prosthetics   Prosthetic Care Comments  Sitting with foot under knee to decrease sitting with external rotation which may be cause of medial knee pressure   Current prosthetic wear tolerance (days/week)  7 days /wk   Current prosthetic wear tolerance (#hours/day)  5 hours 2 x day   Residual limb condition  no issues reported   Education Provided Residual limb care;Correct ply sock adjustment;Proper weight-bearing schedule/adjustment;Proper wear schedule/adjustment   Person(s) Educated Patient;Child(ren)   Education Method Explanation;Demonstration;Verbal cues   Education Method Verbalized understanding;Tactile cues required;Verbal cues required;Needs  further instruction   Donning Prosthesis Supervision   Doffing Prosthesis Supervision                PT Education - 10/13/14 0930    Education provided Yes   Education Details proper assistance for gait, standing to stretch prior to attempting to walk   Person(s) Educated Patient;Child(ren)   Methods Explanation;Demonstration;Tactile cues;Verbal cues   Comprehension Verbalized understanding;Returned demonstration;Verbal cues required;Tactile cues required;Need further instruction          PT Short Term Goals - 10/13/14 0930    PT SHORT TERM GOAL #1   Title Patient donnes prostheses with cues only. (Target Date: 09/16/2014)   Baseline MET 09/22/2014   Status Achieved   PT SHORT TERM GOAL #2   Title Patient tolerates bilateral prostheses wear >6hrs total per day without skin issues or limb pain.  (Target Date: 09/16/2014)   Baseline MET 09/22/2014   Status Achieved   PT SHORT TERM GOAL #3   Title Squat-pivot transfer w/c to level mat modified independent with buttock clearance.  (Target Date: 09/16/2014)   Baseline 09/22/2014 MET with general cues to lift buttocks more   Status Achieved   PT SHORT TERM GOAL #4   Title Sit to / from stand w/c to parallel bars with moderate assist  (Target Date: 09/16/2014)   Baseline MET 09/13/2014   Status Achieved   PT SHORT TERM GOAL #5   Title Patient ambulates 10' in parallel bars with prostheses with max assist of 1 person with 2nd person supervising for safety.  (Target Date: 09/16/2014)   Baseline MET 09/13/2014   Time 1   Period Months   Status Achieved   Additional Short Term Goals   Additional Short Term Goals Yes   PT SHORT TERM GOAL #6   Title Patient tolerates wear of prostheses >90% of awake hours, at dialysis may remove prostheses for comfort, with PT guidance cues. (Target Date: 10/14/2014) NEW Target Date 11/17/2014   Baseline 10/11/14: pt has increased his wear time since last goal check, however only up to about 75-80% of awake hours  on consistent basis.    Time 1   Period Months   Status On-going   PT SHORT TERM GOAL #7   Title Patient performs stand-pivot transfers w/c to chair with armrests with RW & prostheses with minimal assist. (Target Date: 10/14/2014)   Baseline 10/11/14: met today   Status Achieved   PT SHORT TERM GOAL #  8   Title Patient ambulates 87' with RW & prostheses including turning to sit in chair with armrests with MinA. (Target Date: 10/14/2014)   Baseline 10/13/2014 MET    Status Achieved   PT SHORT TERM GOAL #9   TITLE Patient ambulates 72' with family assistance with RW & prosthesis with PT supervising / cueing for safety. (NEW Target Date 11/17/2014)   Time 1   Period Months   Status New   PT SHORT TERM GOAL #10   TITLE Patient able to maintain upright wtih RW support for 2 minutes & reach 3" anteriorly and to knee level with contact assist. (NEW Target Date 11/17/2014)   Time 1   Period Months   Status New           PT Long Term Goals - 10/13/14 0930    PT LONG TERM GOAL #1   Title Tolerates wear of prostheses >90% of awake hours without change in skin integrity nor undue tenderness.  (Target Date: 12/16/2014)   Time 4   Period Months   Status On-going   PT LONG TERM GOAL #2   Title Patient demonstrates / verbalizes proper prosthetic care for safe use of prostheses without issues.  (Target Date: 12/16/2014)   Time 4   Period Months   Status On-going   PT LONG TERM GOAL #3   Title Sit to /from stand and stand-pivot transfers with RW & prostheses modified independent.  (Target Date: 12/16/2014)   Time 4   Period Months   Status Revised   PT LONG TERM GOAL #4   Title Standing balance with RW & prostheses manages clothes & reaches 5" with supervision.  (Target Date: 12/16/2014)   Time 4   Period Months   Status On-going   PT LONG TERM GOAL #5   Title Patient ambulates 24' with RW & prostheses with minimal assist from family safely.  (Target Date: 12/16/2014)   Time 4   Period Months    Status On-going               Plan - 10/13/14 1127    Clinical Impression Statement Patient met 2 of 3 STGs and partially met remaining STG. Patient's certifcation period ends 10/18/2014 which is next scheduled visit so PT will do recertification at that time. Original plan of care was 4 months and currently 2 months into plan of care.    Pt will benefit from skilled therapeutic intervention in order to improve on the following deficits Abnormal gait;Decreased activity tolerance;Decreased balance;Decreased endurance;Decreased knowledge of precautions;Decreased knowledge of use of DME;Decreased mobility;Decreased range of motion;Decreased strength;Postural dysfunction;Prosthetic Dependency   Rehab Potential Good   Clinical Impairments Affecting Rehab Potential Dialysis limits strength and endurance with slow gains.    PT Frequency 2x / week   PT Duration Other (comment)  17 weeks (4 months - 120 days)   PT Treatment/Interventions ADLs/Self Care Home Management;DME Instruction;Gait training;Functional mobility training;Therapeutic activities;Therapeutic exercise;Balance training;Neuromuscular re-education;Patient/family education;Prosthetic Training;Passive range of motion   PT Next Visit Plan assess remaining STG's. If second person available, assess gait from a distance to look at prosthetic alignment with gait as possible reason for right leg pain with gait;continue toward LTG's.   Consulted and Agree with Plan of Care Patient;Family member/caregiver   Family Member Consulted dtr        Problem List Patient Active Problem List   Diagnosis Date Noted  . Complications, amputation stump late 05/20/2014  . Weakness generalized 04/07/2014  . Sepsis 04/07/2014  .  Decubitus ulcer, stage II 04/07/2014  . Decubitus ulcer of ankle   . Fatigue   . Wound infection 04/06/2014  . Decubitus ulcer of sacral region, stage 2 03/07/2014  . Glaucoma 03/07/2014  . Gout 03/07/2014  . Below knee  amputation status 02/18/2014  . Osteomyelitis 01/04/2014  . Phantom limb 12/14/2013  . Type 1 diabetes mellitus with diabetic foot infection   . Diabetes mellitus with peripheral vascular disease   . Asthma 05/17/2013  . History of MRSA infection 04/22/2013  . Peripheral vascular disease 11/19/2012  . End-stage renal disease on hemodialysis 05/05/2012  . Kahler disease 03/30/2012  . Anemia in chronic kidney disease 03/19/2012  . Hypothyroidism 03/19/2012  . Anemia, iron deficiency 03/19/2012  . Hypertension 03/18/2012  . Chronic kidney disease (CKD), stage V 03/18/2012  . Cardiac conduction disorder 03/18/2012  . MGUS (monoclonal gammopathy of unknown significance) 02/28/2011  . Monoclonal paraproteinemia 02/28/2011    Jamey Reas PT, DPT 10/14/2014, 11:37 AM  [facility] [address] [place], [place], [address] Phone: [phone]   Fax:  [phone]

## 2014-10-18 ENCOUNTER — Ambulatory Visit: Payer: Medicare Other | Attending: Orthopedic Surgery | Admitting: Physical Therapy

## 2014-10-18 ENCOUNTER — Encounter: Payer: Self-pay | Admitting: Physical Therapy

## 2014-10-18 DIAGNOSIS — R29898 Other symptoms and signs involving the musculoskeletal system: Secondary | ICD-10-CM | POA: Insufficient documentation

## 2014-10-18 DIAGNOSIS — Z89511 Acquired absence of right leg below knee: Secondary | ICD-10-CM | POA: Diagnosis present

## 2014-10-18 DIAGNOSIS — R29818 Other symptoms and signs involving the nervous system: Secondary | ICD-10-CM | POA: Diagnosis present

## 2014-10-18 DIAGNOSIS — Z89512 Acquired absence of left leg below knee: Secondary | ICD-10-CM | POA: Insufficient documentation

## 2014-10-18 DIAGNOSIS — R269 Unspecified abnormalities of gait and mobility: Secondary | ICD-10-CM | POA: Insufficient documentation

## 2014-10-18 DIAGNOSIS — R6889 Other general symptoms and signs: Secondary | ICD-10-CM | POA: Insufficient documentation

## 2014-10-18 DIAGNOSIS — R2681 Unsteadiness on feet: Secondary | ICD-10-CM | POA: Diagnosis present

## 2014-10-18 DIAGNOSIS — Z4789 Encounter for other orthopedic aftercare: Secondary | ICD-10-CM

## 2014-10-18 DIAGNOSIS — Z5189 Encounter for other specified aftercare: Secondary | ICD-10-CM | POA: Diagnosis present

## 2014-10-18 NOTE — Therapy (Signed)
[facility] [address] [place], [place], [address] Phone: [phone]   Fax:  [phone]  Physical Therapy Treatment  Patient Details  Name: Jack [name] MRN: [id number] Date of Birth: July 23, 1945 Referring Provider:  Lucianne Lei, MD  Encounter Date: 10/18/2014      PT End of Session - 10/18/14 0935    Visit Number 15   Number of Visits 35   Date for PT Re-Evaluation 10/17/14   Authorization Type G-Code every 10 visits   PT Start Time 0930   PT Stop Time 1012   PT Time Calculation (min) 42 min   Equipment Utilized During Treatment Gait belt   Activity Tolerance Patient limited by fatigue;Patient tolerated treatment well   Behavior During Therapy [facility] for tasks assessed/performed      Past Medical History  Diagnosis Date  . Hypertension   . Pneumonia     2012  . Heart murmur   . Glaucoma   . Multiple myeloma, without mention of having achieved remission 03/30/2012    Cytogenetic neg on 03/23/2012.  . Asthma   . Hyperparathyroidism, secondary renal ([place])   . Peripheral arterial disease ([place])   . End-stage renal disease on hemodialysis [facility])     Started HD March 2014.  Cause of ESRD was DM.  Gets HD at [facility] on [place] on MWF schedule.   . Thyroid disease     hyperparathyroidism  . MRSA bacteremia   . Anemia   . Peripheral vascular disease, unspecified ([place]) 11/19/2012    In the past had R foot toe amps then R TMA. In 2015 had left foot toe amp > then TMA >then L BKA on 05/14/13   . History of MRSA infection 04/22/2013    Bacteremia assoc w L foot wound infection Mar 2015   . Gangrene of foot ([place])   . ESRD on hemodialysis ([place])   . Diabetes mellitus with peripheral vascular disease ([place])   . Hypothyroidism   . Decubitus ulcer of sacral region, stage 2 03/07/2014  . Glaucoma 03/07/2014  . Gangrene ([place])     right BKA  . Contracture of joint     left knee    Past Surgical History  Procedure  Laterality Date  . Thyroidectomy    . Cervical disc surgery    . Insertion of dialysis catheter Right 03/19/2012    Procedure: INSERTION OF DIALYSIS CATHETER;  Surgeon: Mal Misty, MD;  Location: [place];  Service: Vascular;  Laterality: Right;  Right Internal Jugular  . Av fistula placement Left 03/25/2012    Procedure: ARTERIOVENOUS (AV) FISTULA CREATION;  Surgeon: Mal Misty, MD;  Location: [place];  Service: Vascular;  Laterality: Left;  . Ligation of competing branches of arteriovenous fistula Left 05/08/2012    Procedure: LIGATION OF COMPETING BRANCHES OF ARTERIOVENOUS FISTULA;  Surgeon: Mal Misty, MD;  Location: [place];  Service: Vascular;  Laterality: Left;  Ultrasound guided  . Cardiac catheterization      approx 30 years ago  . Amputation Right 11/10/2012    Procedure: AMPUTATION FIRST and SECOND TOES Right Foot;  Surgeon: Elam Dutch, MD;  Location: [facility] OR;  Service: Vascular;  Laterality: Right;  . Transmetatarsal amputation Left 12/16/2012    Procedure: TRANSMETATARSAL AMPUTATION AND VAC PLACEMENT;  Surgeon: Elam Dutch, MD;  Location: [place];  Service: Vascular;  Laterality: Left;  . Amputation Left 04/07/2013    Procedure: AMPUTATION DIGIT- LEFT 1ST TOE;  Surgeon:  Mal Misty, MD;  Location: [place];  Service: Vascular;  Laterality: Left;  . Tee without cardioversion N/A 04/20/2013    Procedure: TRANSESOPHAGEAL ECHOCARDIOGRAM (TEE);  Surgeon: Josue Hector, MD;  Location: [facility] ENDOSCOPY;  Service: Cardiovascular;  Laterality: N/A;  . I&d extremity Left 04/22/2013    Procedure: [id number] OIT DEBRIDEMENT LEFT FIRST TOE AMPUTATION WOUND ;  Surgeon: Mal Misty, MD;  Location: [place];  Service: Vascular;  Laterality: Left;  . Amputation Left 04/26/2013    Procedure: Left Foot Transmetatarsal Amputation;  Surgeon: Newt Minion, MD;  Location: [place];  Service: Orthopedics;  Laterality: Left;  . Toe amputation      D/C 04-30-13  . Below knee leg amputation Left 05/14/2013     DR [name]  . Amputation Left 05/14/2013    Procedure: AMPUTATION BELOW KNEE;  Surgeon: Newt Minion, MD;  Location: [place];  Service: Orthopedics;  Laterality: Left;  Left Below Knee Amputation  . Eye surgery Bilateral     CATARACTS  . Amputation Left 07/23/2013    Procedure: AMPUTATION BELOW KNEE;  Surgeon: Newt Minion, MD;  Location: [place];  Service: Orthopedics;  Laterality: Left;  Left Below Knee Amputation Revision  . Abdominal aortagram Bilateral 11/06/2012    Procedure: ABDOMINAL AORTAGRAM;  Surgeon: Elam Dutch, MD;  Location: [facility] CATH LAB;  Service: Cardiovascular;  Laterality: Bilateral;  . I&d extremity Right 01/05/2014    Procedure: IRRIGATION AND DEBRIDEMENT Right Heel Ulcer;  Surgeon: Mcarthur Rossetti, MD;  Location: [place];  Service: Orthopedics;  Laterality: Right;  Surgeon Available after 5PM  . Below knee leg amputation Right 02/18/2014    dr duda  . Amputation Right 02/18/2014    Procedure: AMPUTATION BELOW KNEE;  Surgeon: Newt Minion, MD;  Location: [place];  Service: Orthopedics;  Laterality: Right;  . Stump revision Right 05/20/2014    Procedure: Revision Right Below Knee Amputation;  Surgeon: Newt Minion, MD;  Location: [place];  Service: Orthopedics;  Laterality: Right;  . Repair quadriceps/hamstring muscles Left 05/20/2014    Procedure: Left Hamstring Release;  Surgeon: Newt Minion, MD;  Location: [place];  Service: Orthopedics;  Laterality: Left;    There were no vitals filed for this visit.  Visit Diagnosis:  Weakness of both legs  Decreased functional activity tolerance  Abnormality of gait  Encounter for prosthetic gait training  Activity intolerance      Subjective Assessment - 10/18/14 0935    Subjective No new complaints. No falls or pain to report. Still having issues with walking at home.   Currently in Pain? No/denies           [facility] Adult PT Treatment/Exercise - 10/18/14 0938    Transfers   Sit to Stand 4: Min guard;With upper extremity  assist;With armrests;From chair/3-in-1   Sit to Stand Details Verbal cues for precautions/safety;Verbal cues for technique   Sit to Stand Details (indicate cue type and reason) cues to scoot forward, for bil foot position and anterior weight shift   Stand to Sit 4: Min guard;With upper extremity assist;With armrests;To chair/3-in-1   Stand to Sit Details (indicate cue type and reason) Verbal cues for technique;Verbal cues for precautions/safety   Stand to Sit Details cues to reach back and use arms to control descent.   Ambulation/Gait   Ambulation/Gait Yes   Ambulation/Gait Assistance 4: Min assist;3: Mod assist  mod assist toward end of 4th gait cycle   Ambulation/Gait Assistance Details cues  on posture, equal step length, increased hip/knee flexion for increased foot clearance with swing phase.   Ambulation Distance (Feet) 40 Feet  x1, 42 x1, 50 x1, 46 x1   Assistive device Prostheses;Rolling walker   Gait Pattern Step-to pattern;Decreased step length - right;Decreased step length - left;Decreased stride length;Decreased hip/knee flexion - right;Decreased hip/knee flexion - left;Right flexed knee in stance;Left flexed knee in stance;Lateral hip instability;Trunk flexed;Poor foot clearance - left;Poor foot clearance - right   Ambulation Surface Level;Indoor   Prosthetics   Prosthetic Care Comments  Pt able to stand for 2 minutes with min guard assist mostly, 2 episodes of needed min assist for balnce, with walker and tall posture. Pt able to reach 2 inches forward with min guard assist and 4 inches with min assist;pt able to reach to down with both arms to just below his knees with min guard assist.                                                  Current prosthetic wear tolerance (days/week)  7 days /wk   Current prosthetic wear tolerance (#hours/day)  4-5 hours,  2x day   Residual limb condition  no issues reported   Education Provided Residual limb care;Correct ply sock adjustment;Proper  wear schedule/adjustment;Proper weight-bearing schedule/adjustment  increased activity at home (gait)   Person(s) Educated Patient;Child(ren)  daughter   Education Method Explanation;Verbal cues   Education Method Verbalized understanding;Needs further instruction;Verbal cues required   Donning Prosthesis Supervision   Doffing Prosthesis Supervision           PT Short Term Goals - 10/18/14 0936    PT SHORT TERM GOAL #1   Title Patient donnes prostheses with cues only. (Target Date: 09/16/2014)   Baseline MET 09/22/2014   Status Achieved   PT SHORT TERM GOAL #2   Title Patient tolerates bilateral prostheses wear >6hrs total per day without skin issues or limb pain.  (Target Date: 09/16/2014)   Baseline MET 09/22/2014   Status Achieved   PT SHORT TERM GOAL #3   Title Squat-pivot transfer w/c to level mat modified independent with buttock clearance.  (Target Date: 09/16/2014)   Baseline 09/22/2014 MET with general cues to lift buttocks more   Status Achieved   PT SHORT TERM GOAL #4   Title Sit to / from stand w/c to parallel bars with moderate assist  (Target Date: 09/16/2014)   Baseline MET 09/13/2014   Status Achieved   PT SHORT TERM GOAL #5   Title Patient ambulates 10' in parallel bars with prostheses with max assist of 1 person with 2nd person supervising for safety.  (Target Date: 09/16/2014)   Baseline MET 09/13/2014   Time 1   Period Months   Status Achieved   PT SHORT TERM GOAL #6   Title Patient tolerates wear of prostheses >90% of awake hours, at dialysis may remove prostheses for comfort, with PT guidance cues. (Target Date: 10/14/2014) NEW Target Date 11/17/2014   Baseline 10/11/14: pt has increased his wear time since last goal check, however only up to about 75-80% of awake hours on consistent basis.    Status Not Met   PT SHORT TERM GOAL #7   Title Patient performs stand-pivot transfers w/c to chair with armrests with RW & prostheses with minimal assist. (Target Date: 10/14/2014)    Baseline 10/11/14:  met today   Status Achieved   PT SHORT TERM GOAL #8   Title Patient ambulates 53' with RW & prostheses including turning to sit in chair with armrests with MinA. (Target Date: 10/14/2014)   Baseline 10/13/2014 MET    Status Achieved   PT SHORT TERM GOAL #9   TITLE Patient ambulates 53' with family assistance with RW & prosthesis with PT supervising / cueing for safety. (NEW Target Date 11/17/2014)   Baseline 10/18/14: limited walking at home with family assist at this time   Status Not Met   PT [place] #10   TITLE Patient able to maintain upright wtih RW support for 2 minutes & reach 3" anteriorly and to knee level with contact assist. (NEW Target Date 11/17/2014)   Baseline 10/18/14: needed min assist x2 due to posterior lean/balance loss, otherwise able to do all the above with min guard assist.   Period Months   Status Partially Met           PT Long Term Goals - 10/18/14 1509    PT LONG TERM GOAL #1   Title Tolerates wear of prostheses >90% of awake hours without change in skin integrity nor undue tenderness.  (Target Date: 12/16/2014)   Baseline 10/18/14: wearing 70-75% of awake hours   Status Not Met   PT LONG TERM GOAL #2   Title Patient demonstrates / verbalizes proper prosthetic care for safe use of prostheses without issues.  (Target Date: 12/16/2014)   Baseline 10/18/14: needs continued cues to increase wear time and on sock management   Status Partially Met   PT LONG TERM GOAL #3   Title Sit to /from stand and stand-pivot transfers with RW & prostheses modified independent.  (Target Date: 12/16/2014)   Baseline 10/18/14: needs min assist to min guard assist   Status Not Met   PT LONG TERM GOAL #4   Title Standing balance with RW & prostheses manages clothes & reaches 5" with supervision.  (Target Date: 12/16/2014)   Baseline met on 10/18/14   Status Achieved   PT LONG TERM GOAL #5   Title Patient ambulates 74' with RW & prostheses with minimal assist  from family safely.  (Target Date: 12/16/2014)   Baseline 10/18/14: pt with limited gait at home with family to date   Status Not Met           Plan - 10/18/14 0936    Clinical Impression Statement Has not met all remaining STG's or LTG"s, however is progressing well towards them. Pt in do renewal to continue to work towards LTG's. Reinforced to pt need for increased activity at home with family for optimal results.   Pt will benefit from skilled therapeutic intervention in order to improve on the following deficits Abnormal gait;Decreased activity tolerance;Decreased balance;Decreased endurance;Decreased knowledge of precautions;Decreased knowledge of use of DME;Decreased mobility;Decreased range of motion;Decreased strength;Postural dysfunction;Prosthetic Dependency   Rehab Potential Good   Clinical Impairments Affecting Rehab Potential Dialysis limits strength and endurance with slow gains.    PT Frequency 2x / week   PT Duration Other (comment)  17 weeks (4 months - 120 days)   PT Treatment/Interventions ADLs/Self Care Home Management;DME Instruction;Gait training;Functional mobility training;Therapeutic activities;Therapeutic exercise;Balance training;Neuromuscular re-education;Patient/family education;Prosthetic Training;Passive range of motion   PT Next Visit Plan If second person available, assess gait from a distance to look at prosthetic alignment with gait as possible reason for right leg pain with gait;continue toward LTG's.   Consulted and Agree with  Plan of Care Patient;Family member/caregiver   Family Member Consulted dtr        Problem List Patient Active Problem List   Diagnosis Date Noted  . Complications, amputation stump late ([place]) 05/20/2014  . Weakness generalized 04/07/2014  . Sepsis ([place]) 04/07/2014  . Decubitus ulcer, stage II 04/07/2014  . Decubitus ulcer of ankle   . Fatigue   . Wound infection ([place]) 04/06/2014  . Decubitus ulcer of sacral region, stage 2  03/07/2014  . Glaucoma 03/07/2014  . Gout 03/07/2014  . Below knee amputation status ([place]) 02/18/2014  . Osteomyelitis ([place]) 01/04/2014  . Phantom limb ([place]) 12/14/2013  . Type 1 diabetes mellitus with diabetic foot infection ([place])   . Diabetes mellitus with peripheral vascular disease ([place])   . Asthma 05/17/2013  . History of MRSA infection 04/22/2013  . Peripheral vascular disease ([place]) 11/19/2012  . End-stage renal disease on hemodialysis ([place]) 05/05/2012  . Kahler disease ([place]) 03/30/2012  . Anemia in chronic kidney disease 03/19/2012  . Hypothyroidism 03/19/2012  . Anemia, iron deficiency 03/19/2012  . Hypertension 03/18/2012  . Chronic kidney disease (CKD), stage V ([place]) 03/18/2012  . Cardiac conduction disorder 03/18/2012  . MGUS (monoclonal gammopathy of unknown significance) 02/28/2011  . Monoclonal paraproteinemia 02/28/2011    Willow Ora 10/18/2014, 3:17 PM  Willow Ora, PTA, [facility] [address], [place], [place] [address] [phone] 10/18/2014, 3:17 PM

## 2014-10-20 ENCOUNTER — Ambulatory Visit: Payer: Medicare Other | Admitting: Physical Therapy

## 2014-10-20 ENCOUNTER — Encounter: Payer: Self-pay | Admitting: Physical Therapy

## 2014-10-20 VITALS — BP 172/70 | HR 62

## 2014-10-20 DIAGNOSIS — R2689 Other abnormalities of gait and mobility: Secondary | ICD-10-CM

## 2014-10-20 DIAGNOSIS — R269 Unspecified abnormalities of gait and mobility: Secondary | ICD-10-CM

## 2014-10-20 DIAGNOSIS — R29898 Other symptoms and signs involving the musculoskeletal system: Secondary | ICD-10-CM

## 2014-10-20 DIAGNOSIS — R2681 Unsteadiness on feet: Secondary | ICD-10-CM

## 2014-10-20 DIAGNOSIS — R6889 Other general symptoms and signs: Secondary | ICD-10-CM

## 2014-10-20 DIAGNOSIS — Z4789 Encounter for other orthopedic aftercare: Secondary | ICD-10-CM

## 2014-10-20 NOTE — Therapy (Signed)
[facility] [address] [place], [place], [address] Phone: [phone]   Fax:  [phone]  Physical Therapy Treatment  Patient Details  Name: Jack [name] MRN: [id number] Date of Birth: 10-Apr-1945 Referring Provider:  Lucianne Lei, MD  Encounter Date: 10/20/2014      PT End of Session - 10/20/14 0936    Visit Number 16   Number of Visits 35   Date for PT Re-Evaluation 10/17/14   Authorization Type G-Code every 10 visits   PT Start Time 0931   PT Stop Time 1015   PT Time Calculation (min) 44 min   Equipment Utilized During Treatment Gait belt   Activity Tolerance Patient limited by fatigue;Patient tolerated treatment well   Behavior During Therapy [facility] for tasks assessed/performed      Past Medical History  Diagnosis Date  . Hypertension   . Pneumonia     2012  . Heart murmur   . Glaucoma   . Multiple myeloma, without mention of having achieved remission 03/30/2012    Cytogenetic neg on 03/23/2012.  . Asthma   . Hyperparathyroidism, secondary renal ([name])   . Peripheral arterial disease ([place])   . End-stage renal disease on hemodialysis [facility])     Started HD March 2014.  Cause of ESRD was DM.  Gets HD at [facility] on [place] on MWF schedule.   . Thyroid disease     hyperparathyroidism  . MRSA bacteremia   . Anemia   . Peripheral vascular disease, unspecified ([place]) 11/19/2012    In the past had R foot toe amps then R TMA. In 2015 had left foot toe amp > then TMA >then L BKA on 05/14/13   . History of MRSA infection 04/22/2013    Bacteremia assoc w L foot wound infection Mar 2015   . Gangrene of foot ([place])   . ESRD on hemodialysis ([place])   . Diabetes mellitus with peripheral vascular disease ([place])   . Hypothyroidism   . Decubitus ulcer of sacral region, stage 2 03/07/2014  . Glaucoma 03/07/2014  . Gangrene ([place])     right BKA  . Contracture of joint     left knee    Past Surgical History  Procedure  Laterality Date  . Thyroidectomy    . Cervical disc surgery    . Insertion of dialysis catheter Right 03/19/2012    Procedure: INSERTION OF DIALYSIS CATHETER;  Surgeon: Mal Misty, MD;  Location: [place];  Service: Vascular;  Laterality: Right;  Right Internal Jugular  . Av fistula placement Left 03/25/2012    Procedure: ARTERIOVENOUS (AV) FISTULA CREATION;  Surgeon: Mal Misty, MD;  Location: [place];  Service: Vascular;  Laterality: Left;  . Ligation of competing branches of arteriovenous fistula Left 05/08/2012    Procedure: LIGATION OF COMPETING BRANCHES OF ARTERIOVENOUS FISTULA;  Surgeon: Mal Misty, MD;  Location: [place];  Service: Vascular;  Laterality: Left;  Ultrasound guided  . Cardiac catheterization      approx 30 years ago  . Amputation Right 11/10/2012    Procedure: AMPUTATION FIRST and SECOND TOES Right Foot;  Surgeon: Elam Dutch, MD;  Location: [facility] OR;  Service: Vascular;  Laterality: Right;  . Transmetatarsal amputation Left 12/16/2012    Procedure: TRANSMETATARSAL AMPUTATION AND VAC PLACEMENT;  Surgeon: Elam Dutch, MD;  Location: [place];  Service: Vascular;  Laterality: Left;  . Amputation Left 04/07/2013    Procedure: AMPUTATION DIGIT- LEFT 1ST TOE;  Surgeon:  Mal Misty, MD;  Location: [place];  Service: Vascular;  Laterality: Left;  . Tee without cardioversion N/A 04/20/2013    Procedure: TRANSESOPHAGEAL ECHOCARDIOGRAM (TEE);  Surgeon: Josue Hector, MD;  Location[facility]COPY;  Service: Cardiovascular;  Laterality: N/A;  . I&d extremity Left 04/22/2013    Procedure: [id number] MSX DEBRIDEMENT LEFT FIRST TOE AMPUTATION WOUND ;  Surgeon: Mal Misty, MD;  Location: [place];  Service: Vascular;  Laterality: Left;  . Amputation Left 04/26/2013    Procedure: Left Foot Transmetatarsal Amputation;  Surgeon: Newt Minion, MD;  Location: [place];  Service: Orthopedics;  Laterality: Left;  . Toe amputation      D/C 04-30-13  . Below knee leg amputation Left 05/14/2013     DR [name]  . Amputation Left 05/14/2013    Procedure: AMPUTATION BELOW KNEE;  Surgeon: Newt Minion, MD;  Location: [place];  Service: Orthopedics;  Laterality: Left;  Left Below Knee Amputation  . Eye surgery Bilateral     CATARACTS  . Amputation Left 07/23/2013    Procedure: AMPUTATION BELOW KNEE;  Surgeon: Newt Minion, MD;  Location: [place];  Service: Orthopedics;  Laterality: Left;  Left Below Knee Amputation Revision  . Abdominal aortagram Bilateral 11/06/2012    Procedure: ABDOMINAL AORTAGRAM;  Surgeon: Elam Dutch, MD;  Location: [facility] CATH LAB;  Service: Cardiovascular;  Laterality: Bilateral;  . I&d extremity Right 01/05/2014    Procedure: IRRIGATION AND DEBRIDEMENT Right Heel Ulcer;  Surgeon: Mcarthur Rossetti, MD;  Location: [place];  Service: Orthopedics;  Laterality: Right;  Surgeon Available after 5PM  . Below knee leg amputation Right 02/18/2014    dr duda  . Amputation Right 02/18/2014    Procedure: AMPUTATION BELOW KNEE;  Surgeon: Newt Minion, MD;  Location: [place];  Service: Orthopedics;  Laterality: Right;  . Stump revision Right 05/20/2014    Procedure: Revision Right Below Knee Amputation;  Surgeon: Newt Minion, MD;  Location: [place];  Service: Orthopedics;  Laterality: Right;  . Repair quadriceps/hamstring muscles Left 05/20/2014    Procedure: Left Hamstring Release;  Surgeon: Newt Minion, MD;  Location: [place];  Service: Orthopedics;  Laterality: Left;    Filed Vitals:   10/20/14 0942 10/20/14 0948  BP: 135/75 172/70  Pulse: 68 62    Visit Diagnosis:  Weakness of both legs  Decreased functional activity tolerance  Abnormality of gait  Encounter for prosthetic gait training  Activity intolerance  Unsteadiness  Balance problems      Subjective Assessment - 10/20/14 0935    Subjective DId not go to dialysis yesterday due to not feeling well. Goes again tomorrow. Has not walked at home with family as yet either. No falls or pain to reportl   Currently in  Pain? No/denies           [facility] Adult PT Treatment/Exercise - 10/20/14 0938    Transfers   Sit to Stand 4: Min guard;With upper extremity assist;With armrests;From chair/3-in-1   Sit to Stand Details Verbal cues for precautions/safety;Verbal cues for technique   Sit to Stand Details (indicate cue type and reason) cues to scoot forward and for anterior weight shifting to stand, cues to walk feet back underneath him as he is standing.   Stand to Sit 4: Min guard;With upper extremity assist;With armrests;To chair/3-in-1   Stand to Sit Details (indicate cue type and reason) Verbal cues for technique;Verbal cues for precautions/safety   Stand to Sit Details cues to  reach back to control descent with sitting.   Stand Pivot Transfers 4: Min assist  with walker/prostheses   Stand Pivot Transfer Details (indicate cue type and reason) wheelchair<>scifit with min assist and cues on posture/technique   Ambulation/Gait   Ambulation/Gait Yes   Ambulation/Gait Assistance 4: Min guard;4: Min assist   Ambulation/Gait Assistance Details cues on posture, increased hip/knee extension, increased/equal step length   Ambulation Distance (Feet) 50 Feet  x1, 60 x1, 45 x1   Assistive device Prostheses;Rolling walker   Gait Pattern Step-through pattern;Decreased step length - right;Decreased step length - left;Decreased stride length;Right flexed knee in stance;Left flexed knee in stance;Decreased trunk rotation;Trunk flexed;Narrow base of support;Decreased hip/knee flexion - left;Decreased hip/knee flexion - right   Ambulation Surface Level;Indoor   Knee/Hip Exercises: Aerobic   Other Aerobic Scifit x 4 extremities level 2.0 with goal rpm >/= 50 for strengthening and activity tolerance x 10 minutes   Prosthetics   Current prosthetic wear tolerance (days/week)  7 days /wk   Current prosthetic wear tolerance (#hours/day)  4-5 hours, 2x day, maybe a little less on dialysis days   Residual limb condition  no issues  reported   Donning Prosthesis Supervision   Doffing Prosthesis Supervision             PT Short Term Goals - 10/18/14 0936    PT SHORT TERM GOAL #1   Title Patient donnes prostheses with cues only. (Target Date: 09/16/2014)   Baseline MET 09/22/2014   Status Achieved   PT SHORT TERM GOAL #2   Title Patient tolerates bilateral prostheses wear >6hrs total per day without skin issues or limb pain.  (Target Date: 09/16/2014)   Baseline MET 09/22/2014   Status Achieved   PT SHORT TERM GOAL #3   Title Squat-pivot transfer w/c to level mat modified independent with buttock clearance.  (Target Date: 09/16/2014)   Baseline 09/22/2014 MET with general cues to lift buttocks more   Status Achieved   PT SHORT TERM GOAL #4   Title Sit to / from stand w/c to parallel bars with moderate assist  (Target Date: 09/16/2014)   Baseline MET 09/13/2014   Status Achieved   PT SHORT TERM GOAL #5   Title Patient ambulates 10' in parallel bars with prostheses with max assist of 1 person with 2nd person supervising for safety.  (Target Date: 09/16/2014)   Baseline MET 09/13/2014   Time 1   Period Months   Status Achieved   PT SHORT TERM GOAL #6   Title Patient tolerates wear of prostheses >90% of awake hours, at dialysis may remove prostheses for comfort, with PT guidance cues. (Target Date: 10/14/2014) NEW Target Date 11/17/2014   Baseline 10/11/14: pt has increased his wear time since last goal check, however only up to about 75-80% of awake hours on consistent basis.    Status Not Met   PT SHORT TERM GOAL #7   Title Patient performs stand-pivot transfers w/c to chair with armrests with RW & prostheses with minimal assist. (Target Date: 10/14/2014)   Baseline 10/11/14: met today   Status Achieved   PT SHORT TERM GOAL #8   Title Patient ambulates 25' with RW & prostheses including turning to sit in chair with armrests with MinA. (Target Date: 10/14/2014)   Baseline 10/13/2014 MET    Status Achieved   PT SHORT TERM GOAL #9    TITLE Patient ambulates 31' with family assistance with RW & prosthesis with PT supervising / cueing for safety. (NEW  Target Date 11/17/2014)   Baseline 10/18/14: limited walking at home with family assist at this time   Status Not Met   PT [place] #10   TITLE Patient able to maintain upright wtih RW support for 2 minutes & reach 3" anteriorly and to knee level with contact assist. (NEW Target Date 11/17/2014)   Baseline 10/18/14: needed min assist x2 due to posterior lean/balance loss, otherwise able to do all the above with min guard assist.   Period Months   Status Partially Met           PT Long Term Goals - 10/18/14 1509    PT LONG TERM GOAL #1   Title Tolerates wear of prostheses >90% of awake hours without change in skin integrity nor undue tenderness.  (Target Date: 12/16/2014)   Baseline 10/18/14: wearing 70-75% of awake hours   Status Not Met   PT LONG TERM GOAL #2   Title Patient demonstrates / verbalizes proper prosthetic care for safe use of prostheses without issues.  (Target Date: 12/16/2014)   Baseline 10/18/14: needs continued cues to increase wear time and on sock management   Status Partially Met   PT LONG TERM GOAL #3   Title Sit to /from stand and stand-pivot transfers with RW & prostheses modified independent.  (Target Date: 12/16/2014)   Baseline 10/18/14: needs min assist to min guard assist   Status Not Met   PT LONG TERM GOAL #4   Title Standing balance with RW & prostheses manages clothes & reaches 5" with supervision.  (Target Date: 12/16/2014)   Baseline met on 10/18/14   Status Achieved   PT LONG TERM GOAL #5   Title Patient ambulates 36' with RW & prostheses with minimal assist from family safely.  (Target Date: 12/16/2014)   Baseline 10/18/14: pt with limited gait at home with family to date   Status Not Met           Plan - 10/20/14 0936    Clinical Impression Statement Pt with reports of dizziness after 1st gait session, however none after next  few gait trials. Did have some shortness of breath with gait that resolved with rest breaks. Pt making steady progress toward goals.    Pt will benefit from skilled therapeutic intervention in order to improve on the following deficits Abnormal gait;Decreased activity tolerance;Decreased balance;Decreased endurance;Decreased knowledge of precautions;Decreased knowledge of use of DME;Decreased mobility;Decreased range of motion;Decreased strength;Postural dysfunction;Prosthetic Dependency   Rehab Potential Good   Clinical Impairments Affecting Rehab Potential Dialysis limits strength and endurance with slow gains.    PT Frequency 2x / week   PT Duration Other (comment)  17 weeks (4 months - 120 days)   PT Treatment/Interventions ADLs/Self Care Home Management;DME Instruction;Gait training;Functional mobility training;Therapeutic activities;Therapeutic exercise;Balance training;Neuromuscular re-education;Patient/family education;Prosthetic Training;Passive range of motion   PT Next Visit Plan If second person available, assess gait from a distance to look at prosthetic alignment with gait as possible reason for right leg pain with gait;continue toward LTG's.   Consulted and Agree with Plan of Care Patient;Family member/caregiver   Family Member Consulted dtr        Problem List Patient Active Problem List   Diagnosis Date Noted  . Complications, amputation stump late ([place]) 05/20/2014  . Weakness generalized 04/07/2014  . Sepsis ([place]) 04/07/2014  . Decubitus ulcer, stage II 04/07/2014  . Decubitus ulcer of ankle   . Fatigue   . Wound infection ([place]) 04/06/2014  . Decubitus ulcer of  sacral region, stage 2 03/07/2014  . Glaucoma 03/07/2014  . Gout 03/07/2014  . Below knee amputation status ([place]) 02/18/2014  . Osteomyelitis ([place]) 01/04/2014  . Phantom limb ([place]) 12/14/2013  . Type 1 diabetes mellitus with diabetic foot infection ([place])   . Diabetes mellitus with peripheral vascular disease  ([place])   . Asthma 05/17/2013  . History of MRSA infection 04/22/2013  . Peripheral vascular disease ([place]) 11/19/2012  . End-stage renal disease on hemodialysis ([place]) 05/05/2012  . Kahler disease ([place]) 03/30/2012  . Anemia in chronic kidney disease 03/19/2012  . Hypothyroidism 03/19/2012  . Anemia, iron deficiency 03/19/2012  . Hypertension 03/18/2012  . Chronic kidney disease (CKD), stage V ([place]) 03/18/2012  . Cardiac conduction disorder 03/18/2012  . MGUS (monoclonal gammopathy of unknown significance) 02/28/2011  . Monoclonal paraproteinemia 02/28/2011    Willow Ora 10/20/2014, 3:38 PM  Willow Ora, PTA, [facility] [address] [place], [place] [address] [phone] 10/20/2014, 3:38 PM

## 2014-10-25 ENCOUNTER — Ambulatory Visit: Payer: Medicare Other | Admitting: Physical Therapy

## 2014-10-25 DIAGNOSIS — R29898 Other symptoms and signs involving the musculoskeletal system: Secondary | ICD-10-CM

## 2014-10-25 DIAGNOSIS — R269 Unspecified abnormalities of gait and mobility: Secondary | ICD-10-CM

## 2014-10-25 DIAGNOSIS — R6889 Other general symptoms and signs: Secondary | ICD-10-CM

## 2014-10-26 NOTE — Therapy (Signed)
[facility] [address] [place], [place], [address] Phone: [phone]   Fax:  [phone]  Physical Therapy Treatment  Patient Details  Name: Jack [name] MRN: [id number] Date of Birth: 04/10/1945 Referring Pierrette Scheu:  Lucianne Lei, MD  Encounter Date: 10/25/2014      PT End of Session - 10/26/14 1303    Visit Number 17   Number of Visits 35   Date for PT Re-Evaluation 10/17/14  12/17/14 per LTGs of Jamey Reas, PT   Authorization Type G-Code every 10 visits   PT Start Time 0940  Scheduled on PTA who was out sick-able to be seen by this PT    PT Stop Time 1019   PT Time Calculation (min) 39 min   Equipment Utilized During Treatment Gait belt   Activity Tolerance Patient tolerated treatment well   Behavior During Therapy [facility] for tasks assessed/performed      Past Medical History  Diagnosis Date  . Hypertension   . Pneumonia     2012  . Heart murmur   . Glaucoma   . Multiple myeloma, without mention of having achieved remission 03/30/2012    Cytogenetic neg on 03/23/2012.  . Asthma   . Hyperparathyroidism, secondary renal ([place])   . Peripheral arterial disease ([place])   . End-stage renal disease on hemodialysis [facility])     Started HD March 2014.  Cause of ESRD was DM.  Gets HD at [facility] on [address] on MWF schedule.   . Thyroid disease     hyperparathyroidism  . MRSA bacteremia   . Anemia   . Peripheral vascular disease, unspecified ([place]) 11/19/2012    In the past had R foot toe amps then R TMA. In 2015 had left foot toe amp > then TMA >then L BKA on 05/14/13   . History of MRSA infection 04/22/2013    Bacteremia assoc w L foot wound infection Mar 2015   . Gangrene of foot ([place])   . ESRD on hemodialysis ([place])   . Diabetes mellitus with peripheral vascular disease ([place])   . Hypothyroidism   . Decubitus ulcer of sacral region, stage 2 03/07/2014  . Glaucoma 03/07/2014  . Gangrene ([place])     right BKA   . Contracture of joint     left knee    Past Surgical History  Procedure Laterality Date  . Thyroidectomy    . Cervical disc surgery    . Insertion of dialysis catheter Right 03/19/2012    Procedure: INSERTION OF DIALYSIS CATHETER;  Surgeon: Mal Misty, MD;  Location: [facility];  Service: Vascular;  Laterality: Right;  Right Internal Jugular  . Av fistula placement Left 03/25/2012    Procedure: ARTERIOVENOUS (AV) FISTULA CREATION;  Surgeon: Mal Misty, MD;  Location: [place];  Service: Vascular;  Laterality: Left;  . Ligation of competing branches of arteriovenous fistula Left 05/08/2012    Procedure: LIGATION OF COMPETING BRANCHES OF ARTERIOVENOUS FISTULA;  Surgeon: Mal Misty, MD;  Location: [place];  Service: Vascular;  Laterality: Left;  Ultrasound guided  . Cardiac catheterization      approx 30 years ago  . Amputation Right 11/10/2012    Procedure: AMPUTATION FIRST and SECOND TOES Right Foot;  Surgeon: Elam Dutch, MD;  [facility];  Service: Vascular;  Laterality: Right;  . Transmetatarsal amputation Left 12/16/2012    Procedure: TRANSMETATARSAL AMPUTATION AND VAC PLACEMENT;  Surgeon: Elam Dutch, MD;  Location: [place];  Service:  Vascular;  Laterality: Left;  . Amputation Left 04/07/2013    Procedure: AMPUTATION DIGIT- LEFT 1ST TOE;  Surgeon: Mal Misty, MD;  Location: [place] [facility];  Service: Vascular;  Laterality: Left;  . Tee without cardioversion N/A 04/20/2013    Procedure: TRANSESOPHAGEAL ECHOCARDIOGRAM (TEE);  Surgeon: Josue Hector, MD;  Location: [facility] ENDOSCOPY;  Service: Cardiovascular;  Laterality: N/A;  . I&d extremity Left 04/22/2013    Procedure: [id number] XBL DEBRIDEMENT LEFT FIRST TOE AMPUTATION WOUND ;  Surgeon: Mal Misty, MD;  Location: [facility];  Service: Vascular;  Laterality: Left;  . Amputation Left 04/26/2013    Procedure: Left Foot Transmetatarsal Amputation;  Surgeon: Newt Minion, MD;  Location: [place];  Service: Orthopedics;  Laterality: Left;  .  Toe amputation      D/C 04-30-13  . Below knee leg amputation Left 05/14/2013    DR [name]  . Amputation Left 05/14/2013    Procedure: AMPUTATION BELOW KNEE;  Surgeon: Newt Minion, MD;  Location: [facility];  Service: Orthopedics;  Laterality: Left;  Left Below Knee Amputation  . Eye surgery Bilateral     CATARACTS  . Amputation Left 07/23/2013    Procedure: AMPUTATION BELOW KNEE;  Surgeon: Newt Minion, MD;  Location: [place];  Service: Orthopedics;  Laterality: Left;  Left Below Knee Amputation Revision  . Abdominal aortagram Bilateral 11/06/2012    Procedure: ABDOMINAL AORTAGRAM;  Surgeon: Elam Dutch, MD;  Location: [facility] CATH LAB;  Service: Cardiovascular;  Laterality: Bilateral;  . I&d extremity Right 01/05/2014    Procedure: IRRIGATION AND DEBRIDEMENT Right Heel Ulcer;  Surgeon: Mcarthur Rossetti, MD;  Location: [place];  Service: Orthopedics;  Laterality: Right;  Surgeon Available after 5PM  . Below knee leg amputation Right 02/18/2014    dr duda  . Amputation Right 02/18/2014    Procedure: AMPUTATION BELOW KNEE;  Surgeon: Newt Minion, MD;  Location: [place];  Service: Orthopedics;  Laterality: Right;  . Stump revision Right 05/20/2014    Procedure: Revision Right Below Knee Amputation;  Surgeon: Newt Minion, MD;  Location: [place];  Service: Orthopedics;  Laterality: Right;  . Repair quadriceps/hamstring muscles Left 05/20/2014    Procedure: Left Hamstring Release;  Surgeon: Newt Minion, MD;  Location: [place];  Service: Orthopedics;  Laterality: Left;    There were no vitals filed for this visit.  Visit Diagnosis:  Weakness of both legs  Decreased functional activity tolerance  Abnormality of gait      Subjective Assessment - 10/25/14 0944    Subjective Pt not having pain right now.  Had phantom pain all night long in LLE.  Went to dialysis yesterday.  Have been walking across the living room and back with practicing turning to sit in chair.     Currently in Pain? Yes   Pain Score  6    Pain Location Leg   Pain Orientation Left   Pain Descriptors / Indicators --  phantom   Aggravating Factors  Worse at night   Pain Relieving Factors Putting on prosthesis alleviates                         [facility] Adult PT Treatment/Exercise - 10/25/14 0957    Transfers   Sit to Stand 4: Min assist;With upper extremity assist;From chair/3-in-1   Sit to Stand Details Verbal cues for precautions/safety;Verbal cues for technique   Sit to Stand Details (indicate cue type and reason) cues  to get feet underneath him as he is standing   Stand to Sit 4: Min guard;With upper extremity assist;With armrests;To chair/3-in-1   Stand to Sit Details (indicate cue type and reason) Verbal cues for technique;Verbal cues for precautions/safety   Stand to Sit Details cues to reach back, control descent with sitting   Number of Reps --  5 reps   Ambulation/Gait   Ambulation/Gait Yes   Ambulation/Gait Assistance 4: Min guard;4: Min assist   Ambulation/Gait Assistance Details cues on posture, weightshifting for L foot clearance   Ambulation Distance (Feet) 53 Feet   Assistive device Prostheses;Rolling walker   Gait Pattern Step-through pattern;Decreased step length - right;Decreased step length - left;Decreased stride length;Right flexed knee in stance;Left flexed knee in stance;Decreased trunk rotation;Trunk flexed;Narrow base of support;Decreased hip/knee flexion - left;Decreased hip/knee flexion - right   Ambulation Surface Level;Indoor   Gait Comments Pt has to make adjustments to prosthetic socks, decreasing from 3 ply to 1 ply bilaterally   Posture/Postural Control   Posture Comments Postural exercises performed in sitting:  seated forward lean>best posture with scapular squeezes; upright posture with scapular squeezes, then lateral weightshifting and reaching 2 sets x 10 reps; standing posture exercises     Discussed walking at home; pt/daughter indicate large throw rug is  bunching up in middle of room and is not able to be easily moved.  To decrease risk of falls, PT recommends pt walk in hallway if rug cannot be moved.             PT Short Term Goals - 10/18/14 0936    PT SHORT TERM GOAL #1   Title Patient donnes prostheses with cues only. (Target Date: 09/16/2014)   Baseline MET 09/22/2014   Status Achieved   PT SHORT TERM GOAL #2   Title Patient tolerates bilateral prostheses wear >6hrs total per day without skin issues or limb pain.  (Target Date: 09/16/2014)   Baseline MET 09/22/2014   Status Achieved   PT SHORT TERM GOAL #3   Title Squat-pivot transfer w/c to level mat modified independent with buttock clearance.  (Target Date: 09/16/2014)   Baseline 09/22/2014 MET with general cues to lift buttocks more   Status Achieved   PT SHORT TERM GOAL #4   Title Sit to / from stand w/c to parallel bars with moderate assist  (Target Date: 09/16/2014)   Baseline MET 09/13/2014   Status Achieved   PT SHORT TERM GOAL #5   Title Patient ambulates 10' in parallel bars with prostheses with max assist of 1 person with 2nd person supervising for safety.  (Target Date: 09/16/2014)   Baseline MET 09/13/2014   Time 1   Period Months   Status Achieved   PT SHORT TERM GOAL #6   Title Patient tolerates wear of prostheses >90% of awake hours, at dialysis may remove prostheses for comfort, with PT guidance cues. (Target Date: 10/14/2014) NEW Target Date 11/17/2014   Baseline 10/11/14: pt has increased his wear time since last goal check, however only up to about 75-80% of awake hours on consistent basis.    Status Not Met   PT SHORT TERM GOAL #7   Title Patient performs stand-pivot transfers w/c to chair with armrests with RW & prostheses with minimal assist. (Target Date: 10/14/2014)   Baseline 10/11/14: met today   Status Achieved   PT SHORT TERM GOAL #8   Title Patient ambulates 15' with RW & prostheses including turning to sit in chair with armrests with  MinA. (Target Date:  10/14/2014)   Baseline 10/13/2014 MET    Status Achieved   PT SHORT TERM GOAL #9   TITLE Patient ambulates 28' with family assistance with RW & prosthesis with PT supervising / cueing for safety. (NEW Target Date 11/17/2014)   Baseline 10/18/14: limited walking at home with family assist at this time   Status Not Met   PT [place] #10   TITLE Patient able to maintain upright wtih RW support for 2 minutes & reach 3" anteriorly and to knee level with contact assist. (NEW Target Date 11/17/2014)   Baseline 10/18/14: needed min assist x2 due to posterior lean/balance loss, otherwise able to do all the above with min guard assist.   Period Months   Status Partially Met           PT Long Term Goals - 10/18/14 1509    PT LONG TERM GOAL #1   Title Tolerates wear of prostheses >90% of awake hours without change in skin integrity nor undue tenderness.  (Target Date: 12/16/2014)   Baseline 10/18/14: wearing 70-75% of awake hours   Status Not Met   PT LONG TERM GOAL #2   Title Patient demonstrates / verbalizes proper prosthetic care for safe use of prostheses without issues.  (Target Date: 12/16/2014)   Baseline 10/18/14: needs continued cues to increase wear time and on sock management   Status Partially Met   PT LONG TERM GOAL #3   Title Sit to /from stand and stand-pivot transfers with RW & prostheses modified independent.  (Target Date: 12/16/2014)   Baseline 10/18/14: needs min assist to min guard assist   Status Not Met   PT LONG TERM GOAL #4   Title Standing balance with RW & prostheses manages clothes & reaches 5" with supervision.  (Target Date: 12/16/2014)   Baseline met on 10/18/14   Status Achieved   PT LONG TERM GOAL #5   Title Patient ambulates 67' with RW & prostheses with minimal assist from family safely.  (Target Date: 12/16/2014)   Baseline 10/18/14: pt with limited gait at home with family to date   Status Not Met               Plan - 10/26/14 1309    Clinical  Impression Statement Pt and daughter are reporting walking trials at home several times over the weekend, which is progress for home gait.  Pt appears to have difficulty fully clearing LLE initially during gait, but better with cues for weightshifting.  Spoke with primary PT about foot clearance and she plans checking leg/prosthetic length bilaterally.   Pt will benefit from skilled therapeutic intervention in order to improve on the following deficits Abnormal gait;Decreased activity tolerance;Decreased balance;Decreased endurance;Decreased knowledge of precautions;Decreased knowledge of use of DME;Decreased mobility;Decreased range of motion;Decreased strength;Postural dysfunction;Prosthetic Dependency   Rehab Potential Good   Clinical Impairments Affecting Rehab Potential Dialysis limits strength and endurance with slow gains.    PT Frequency 2x / week   PT Duration Other (comment)  17 weeks (4 months - 120 days)   PT Treatment/Interventions ADLs/Self Care Home Management;DME Instruction;Gait training;Functional mobility training;Therapeutic activities;Therapeutic exercise;Balance training;Neuromuscular re-education;Patient/family education;Prosthetic Training;Passive range of motion   PT Next Visit Plan Continue gait training, postural re-education; check leg/prosthetic length for possible adjustments due to difficulty clearing LLE with gait.   Consulted and Agree with Plan of Care Patient;Family member/caregiver   Family Member Consulted dtr        Problem List Patient  Active Problem List   Diagnosis Date Noted  . Complications, amputation stump late ([place]) 05/20/2014  . Weakness generalized 04/07/2014  . Sepsis ([place]) 04/07/2014  . Decubitus ulcer, stage II 04/07/2014  . Decubitus ulcer of ankle   . Fatigue   . Wound infection ([place]) 04/06/2014  . Decubitus ulcer of sacral region, stage 2 03/07/2014  . Glaucoma 03/07/2014  . Gout 03/07/2014  . Below knee amputation status ([place])  02/18/2014  . Osteomyelitis ([place]) 01/04/2014  . Phantom limb ([place]) 12/14/2013  . Type 1 diabetes mellitus with diabetic foot infection ([place])   . Diabetes mellitus with peripheral vascular disease ([place])   . Asthma 05/17/2013  . History of MRSA infection 04/22/2013  . Peripheral vascular disease ([place]) 11/19/2012  . End-stage renal disease on hemodialysis ([place]) 05/05/2012  . Kahler disease ([place]) 03/30/2012  . Anemia in chronic kidney disease 03/19/2012  . Hypothyroidism 03/19/2012  . Anemia, iron deficiency 03/19/2012  . Hypertension 03/18/2012  . Chronic kidney disease (CKD), stage V ([place]) 03/18/2012  . Cardiac conduction disorder 03/18/2012  . MGUS (monoclonal gammopathy of unknown significance) 02/28/2011  . Monoclonal paraproteinemia 02/28/2011    MARRIOTT,AMY W. 10/26/2014, 1:13 PM  Frazier Butt., PT  [facility] [address] [place], [place], [address] Phone: [phone]   Fax:  [phone]

## 2014-10-27 ENCOUNTER — Ambulatory Visit: Payer: Medicare Other | Admitting: Physical Therapy

## 2014-10-27 ENCOUNTER — Encounter: Payer: Self-pay | Admitting: Physical Therapy

## 2014-10-27 DIAGNOSIS — R29898 Other symptoms and signs involving the musculoskeletal system: Secondary | ICD-10-CM

## 2014-10-27 DIAGNOSIS — Z4789 Encounter for other orthopedic aftercare: Secondary | ICD-10-CM

## 2014-10-27 DIAGNOSIS — R269 Unspecified abnormalities of gait and mobility: Secondary | ICD-10-CM

## 2014-10-27 DIAGNOSIS — R6889 Other general symptoms and signs: Secondary | ICD-10-CM

## 2014-10-27 NOTE — Therapy (Signed)
[facility] [address] [place], [place], [address] Phone: [phone]   Fax:  [phone]  Physical Therapy Treatment  Patient Details  Name: TALAN GILDNER MRN: [id number] Date of Birth: 1945-12-08 Referring Provider:  Lucianne Lei, MD  Encounter Date: 10/27/2014      PT End of Session - 10/27/14 0814    Visit Number 18   Number of Visits 35   Date for PT Re-Evaluation 12/17/14   Authorization Type G-Code every 10 visits   PT Start Time 0806   PT Stop Time 0845   PT Time Calculation (min) 39 min   Equipment Utilized During Treatment Gait belt   Activity Tolerance Patient tolerated treatment well   Behavior During Therapy [facility] for tasks assessed/performed      Past Medical History  Diagnosis Date  . Hypertension   . Pneumonia     2012  . Heart murmur   . Glaucoma   . Multiple myeloma, without mention of having achieved remission 03/30/2012    Cytogenetic neg on 03/23/2012.  . Asthma   . Hyperparathyroidism, secondary renal ([place])   . Peripheral arterial disease ([place])   . End-stage renal disease on hemodialysis [facility])     Started HD March 2014.  Cause of ESRD was DM.  Gets HD at [facility] on [address] on MWF schedule.   . Thyroid disease     hyperparathyroidism  . MRSA bacteremia   . Anemia   . Peripheral vascular disease, unspecified ([place]) 11/19/2012    In the past had R foot toe amps then R TMA. In 2015 had left foot toe amp > then TMA >then L BKA on 05/14/13   . History of MRSA infection 04/22/2013    Bacteremia assoc w L foot wound infection Mar 2015   . Gangrene of foot ([place])   . ESRD on hemodialysis ([place])   . Diabetes mellitus with peripheral vascular disease ([place])   . Hypothyroidism   . Decubitus ulcer of sacral region, stage 2 03/07/2014  . Glaucoma 03/07/2014  . Gangrene ([place])     right BKA  . Contracture of joint     left knee    Past Surgical History  Procedure Laterality Date  .  Thyroidectomy    . Cervical disc surgery    . Insertion of dialysis catheter Right 03/19/2012    Procedure: INSERTION OF DIALYSIS CATHETER;  Surgeon: Mal Misty, MD;  Location: [place];  Service: Vascular;  Laterality: Right;  Right Internal Jugular  . Av fistula placement Left 03/25/2012    Procedure: ARTERIOVENOUS (AV) FISTULA CREATION;  Surgeon: Mal Misty, MD;  Location: [place];  Service: Vascular;  Laterality: Left;  . Ligation of competing branches of arteriovenous fistula Left 05/08/2012    Procedure: LIGATION OF COMPETING BRANCHES OF ARTERIOVENOUS FISTULA;  Surgeon: Mal Misty, MD;  Location: [place];  Service: Vascular;  Laterality: Left;  Ultrasound guided  . Cardiac catheterization      approx 30 years ago  . Amputation Right 11/10/2012    Procedure: AMPUTATION FIRST and SECOND TOES Right Foot;  Surgeon: Elam Dutch, MD;  Location: [facility] OR;  Service: Vascular;  Laterality: Right;  . Transmetatarsal amputation Left 12/16/2012    Procedure: TRANSMETATARSAL AMPUTATION AND VAC PLACEMENT;  Surgeon: Elam Dutch, MD;  Location: [place];  Service: Vascular;  Laterality: Left;  . Amputation Left 04/07/2013    Procedure: AMPUTATION DIGIT- LEFT 1ST TOE;  Surgeon: Mal Misty,  MD;  Location: [place];  Service: Vascular;  Laterality: Left;  . Tee without cardioversion N/A 04/20/2013    Procedure: TRANSESOPHAGEAL ECHOCARDIOGRAM (TEE);  Surgeon: Josue Hector, MD;  Location: [facility] ENDOSCOPY;  Service: Cardiovascular;  Laterality: N/A;  . I&d extremity Left 04/22/2013    Procedure: [id number] IOX DEBRIDEMENT LEFT FIRST TOE AMPUTATION WOUND ;  Surgeon: Mal Misty, MD;  Location: [place];  Service: Vascular;  Laterality: Left;  . Amputation Left 04/26/2013    Procedure: Left Foot Transmetatarsal Amputation;  Surgeon: Newt Minion, MD;  Location: [place];  Service: Orthopedics;  Laterality: Left;  . Toe amputation      D/C 04-30-13  . Below knee leg amputation Left 05/14/2013    DR [name]  .  Amputation Left 05/14/2013    Procedure: AMPUTATION BELOW KNEE;  Surgeon: Newt Minion, MD;  Location: [place];  Service: Orthopedics;  Laterality: Left;  Left Below Knee Amputation  . Eye surgery Bilateral     CATARACTS  . Amputation Left 07/23/2013    Procedure: AMPUTATION BELOW KNEE;  Surgeon: Newt Minion, MD;  Location: [place];  Service: Orthopedics;  Laterality: Left;  Left Below Knee Amputation Revision  . Abdominal aortagram Bilateral 11/06/2012    Procedure: ABDOMINAL AORTAGRAM;  Surgeon: Elam Dutch, MD;  Location: [facility] CATH LAB;  Service: Cardiovascular;  Laterality: Bilateral;  . I&d extremity Right 01/05/2014    Procedure: IRRIGATION AND DEBRIDEMENT Right Heel Ulcer;  Surgeon: Mcarthur Rossetti, MD;  Location: [place];  Service: Orthopedics;  Laterality: Right;  Surgeon Available after 5PM  . Below knee leg amputation Right 02/18/2014    dr duda  . Amputation Right 02/18/2014    Procedure: AMPUTATION BELOW KNEE;  Surgeon: Newt Minion, MD;  Location: [place];  Service: Orthopedics;  Laterality: Right;  . Stump revision Right 05/20/2014    Procedure: Revision Right Below Knee Amputation;  Surgeon: Newt Minion, MD;  Location: [place];  Service: Orthopedics;  Laterality: Right;  . Repair quadriceps/hamstring muscles Left 05/20/2014    Procedure: Left Hamstring Release;  Surgeon: Newt Minion, MD;  Location: [place];  Service: Orthopedics;  Laterality: Left;    There were no vitals filed for this visit.  Visit Diagnosis:  Weakness of both legs  Decreased functional activity tolerance  Abnormality of gait  Encounter for prosthetic gait training  Activity intolerance      Subjective Assessment - 10/27/14 0813    Currently in Pain? Yes   Pain Score 6    Pain Location Leg   Pain Orientation Left   Pain Descriptors / Indicators Other (Comment)  phantom   Pain Type Phantom pain   Pain Onset More than a month ago   Pain Frequency Constant   Aggravating Factors  increases at  night   Pain Relieving Factors wearing prosthesis            [facility] Adult PT Treatment/Exercise - 10/27/14 0001    Transfers   Sit to Stand 4: Min guard;With upper extremity assist;From chair/3-in-1;With armrests   Sit to Stand Details Verbal cues for precautions/safety;Verbal cues for technique   Stand to Sit 4: Min guard;With upper extremity assist;With armrests;To chair/3-in-1;4: Min assist   Stand to Sit Details (indicate cue type and reason) Verbal cues for technique;Verbal cues for precautions/safety   Ambulation/Gait   Ambulation/Gait Yes   Ambulation/Gait Assistance 4: Min guard   Ambulation/Gait Assistance Details cues on posture at times and to correct gait  deviations   Ambulation Distance (Feet) 34 Feet  x1, 56 x1;l 50 x1; 72 x1; 60 x1   Assistive device Prostheses;Rolling walker   Gait Pattern Step-through pattern;Decreased step length - right;Decreased step length - left;Decreased stride length;Right flexed knee in stance;Left flexed knee in stance;Decreased trunk rotation;Trunk flexed;Narrow base of support;Decreased hip/knee flexion - left;Decreased hip/knee flexion - right   Ambulation Surface Level;Indoor   Prosthetics   Current prosthetic wear tolerance (days/week)  7 days /wk   Current prosthetic wear tolerance (#hours/day)  4-5 hours, 2x day, maybe a little less on dialysis days   Residual limb condition  no issues reported   Education Provided Residual limb care;Correct ply sock adjustment   Person(s) Educated Patient;Child(ren)   Education Method Explanation;Verbal cues   Education Method Verbalized understanding;Verbal cues required   Donning Prosthesis Supervision   Doffing Prosthesis Supervision            PT Short Term Goals - 10/18/14 0936    PT SHORT TERM GOAL #1   Title Patient donnes prostheses with cues only. (Target Date: 09/16/2014)   Baseline MET 09/22/2014   Status Achieved   PT SHORT TERM GOAL #2   Title Patient tolerates bilateral  prostheses wear >6hrs total per day without skin issues or limb pain.  (Target Date: 09/16/2014)   Baseline MET 09/22/2014   Status Achieved   PT SHORT TERM GOAL #3   Title Squat-pivot transfer w/c to level mat modified independent with buttock clearance.  (Target Date: 09/16/2014)   Baseline 09/22/2014 MET with general cues to lift buttocks more   Status Achieved   PT SHORT TERM GOAL #4   Title Sit to / from stand w/c to parallel bars with moderate assist  (Target Date: 09/16/2014)   Baseline MET 09/13/2014   Status Achieved   PT SHORT TERM GOAL #5   Title Patient ambulates 10' in parallel bars with prostheses with max assist of 1 person with 2nd person supervising for safety.  (Target Date: 09/16/2014)   Baseline MET 09/13/2014   Time 1   Period Months   Status Achieved   PT SHORT TERM GOAL #6   Title Patient tolerates wear of prostheses >90% of awake hours, at dialysis may remove prostheses for comfort, with PT guidance cues. (Target Date: 10/14/2014) NEW Target Date 11/17/2014   Baseline 10/11/14: pt has increased his wear time since last goal check, however only up to about 75-80% of awake hours on consistent basis.    Status Not Met   PT SHORT TERM GOAL #7   Title Patient performs stand-pivot transfers w/c to chair with armrests with RW & prostheses with minimal assist. (Target Date: 10/14/2014)   Baseline 10/11/14: met today   Status Achieved   PT SHORT TERM GOAL #8   Title Patient ambulates 25' with RW & prostheses including turning to sit in chair with armrests with MinA. (Target Date: 10/14/2014)   Baseline 10/13/2014 MET    Status Achieved   PT SHORT TERM GOAL #9   TITLE Patient ambulates 9' with family assistance with RW & prosthesis with PT supervising / cueing for safety. (NEW Target Date 11/17/2014)   Baseline 10/18/14: limited walking at home with family assist at this time   Status Not Met   PT [place] #10   TITLE Patient able to maintain upright wtih RW support for 2 minutes &  reach 3" anteriorly and to knee level with contact assist. (NEW Target Date 11/17/2014)   Baseline 10/18/14:  needed min assist x2 due to posterior lean/balance loss, otherwise able to do all the above with min guard assist.   Period Months   Status Partially Met           PT Long Term Goals - 10/18/14 1509    PT LONG TERM GOAL #1   Title Tolerates wear of prostheses >90% of awake hours without change in skin integrity nor undue tenderness.  (Target Date: 12/16/2014)   Baseline 10/18/14: wearing 70-75% of awake hours   Status Not Met   PT LONG TERM GOAL #2   Title Patient demonstrates / verbalizes proper prosthetic care for safe use of prostheses without issues.  (Target Date: 12/16/2014)   Baseline 10/18/14: needs continued cues to increase wear time and on sock management   Status Partially Met   PT LONG TERM GOAL #3   Title Sit to /from stand and stand-pivot transfers with RW & prostheses modified independent.  (Target Date: 12/16/2014)   Baseline 10/18/14: needs min assist to min guard assist   Status Not Met   PT LONG TERM GOAL #4   Title Standing balance with RW & prostheses manages clothes & reaches 5" with supervision.  (Target Date: 12/16/2014)   Baseline met on 10/18/14   Status Achieved   PT LONG TERM GOAL #5   Title Patient ambulates 37' with RW & prostheses with minimal assist from family safely.  (Target Date: 12/16/2014)   Baseline 10/18/14: pt with limited gait at home with family to date   Status Not Met           Plan - 10/27/14 0815    Clinical Impression Statement Pt with increased gait distance today and improved posture noted with gait until fatigued. Pt making steady progress toward goals.    Pt will benefit from skilled therapeutic intervention in order to improve on the following deficits Abnormal gait;Decreased activity tolerance;Decreased balance;Decreased endurance;Decreased knowledge of precautions;Decreased knowledge of use of DME;Decreased mobility;Decreased  range of motion;Decreased strength;Postural dysfunction;Prosthetic Dependency   Rehab Potential Good   Clinical Impairments Affecting Rehab Potential Dialysis limits strength and endurance with slow gains.    PT Frequency 2x / week   PT Duration Other (comment)  17 weeks (4 months - 120 days)   PT Treatment/Interventions ADLs/Self Care Home Management;DME Instruction;Gait training;Functional mobility training;Therapeutic activities;Therapeutic exercise;Balance training;Neuromuscular re-education;Patient/family education;Prosthetic Training;Passive range of motion   PT Next Visit Plan Continue gait training, postural re-education; check leg/prosthetic length for possible adjustments due to difficulty clearing LLE with gait;barriers with walker/prostheses   Consulted and Agree with Plan of Care Patient;Family member/caregiver   Family Member Consulted dtr        Problem List Patient Active Problem List   Diagnosis Date Noted  . Complications, amputation stump late ([place]) 05/20/2014  . Weakness generalized 04/07/2014  . Sepsis ([place]) 04/07/2014  . Decubitus ulcer, stage II 04/07/2014  . Decubitus ulcer of ankle   . Fatigue   . Wound infection ([place]) 04/06/2014  . Decubitus ulcer of sacral region, stage 2 03/07/2014  . Glaucoma 03/07/2014  . Gout 03/07/2014  . Below knee amputation status ([place]) 02/18/2014  . Osteomyelitis ([place]) 01/04/2014  . Phantom limb ([place]) 12/14/2013  . Type 1 diabetes mellitus with diabetic foot infection ([place])   . Diabetes mellitus with peripheral vascular disease ([place])   . Asthma 05/17/2013  . History of MRSA infection 04/22/2013  . Peripheral vascular disease ([place]) 11/19/2012  . End-stage renal disease on hemodialysis ([place]) 05/05/2012  . Kahler  disease ([place]) 03/30/2012  . Anemia in chronic kidney disease 03/19/2012  . Hypothyroidism 03/19/2012  . Anemia, iron deficiency 03/19/2012  . Hypertension 03/18/2012  . Chronic kidney disease (CKD), stage V ([place])  03/18/2012  . Cardiac conduction disorder 03/18/2012  . MGUS (monoclonal gammopathy of unknown significance) 02/28/2011  . Monoclonal paraproteinemia 02/28/2011    Willow Ora 10/27/2014, 4:51 PM  Willow Ora, PTA, [facility] [address], [place], [place] [address] [phone] 10/27/2014, 4:51 PM

## 2014-10-31 NOTE — Addendum Note (Signed)
Addended by: Isaias Cowman on: 10/31/2014 07:20 AM   Modules accepted: Orders

## 2014-11-01 ENCOUNTER — Ambulatory Visit: Payer: Medicare Other | Admitting: Physical Therapy

## 2014-11-01 DIAGNOSIS — R2681 Unsteadiness on feet: Secondary | ICD-10-CM

## 2014-11-01 DIAGNOSIS — R6889 Other general symptoms and signs: Secondary | ICD-10-CM

## 2014-11-01 DIAGNOSIS — Z89512 Acquired absence of left leg below knee: Secondary | ICD-10-CM

## 2014-11-01 DIAGNOSIS — R29898 Other symptoms and signs involving the musculoskeletal system: Secondary | ICD-10-CM | POA: Diagnosis not present

## 2014-11-01 DIAGNOSIS — R2689 Other abnormalities of gait and mobility: Secondary | ICD-10-CM

## 2014-11-01 DIAGNOSIS — Z89511 Acquired absence of right leg below knee: Secondary | ICD-10-CM

## 2014-11-01 DIAGNOSIS — R269 Unspecified abnormalities of gait and mobility: Secondary | ICD-10-CM

## 2014-11-01 DIAGNOSIS — Z4789 Encounter for other orthopedic aftercare: Secondary | ICD-10-CM

## 2014-11-01 NOTE — Therapy (Signed)
[facility] [address] [place], [place], [address] Phone: [phone]   Fax:  [phone]  Physical Therapy Treatment  Patient Details  Name: Jack [name] MRN: [id number] Date of Birth: 1945/08/27 No Data Recorded  Encounter Date: 11/01/2014      PT End of Session - 11/01/14 0845    Visit Number 19   Number of Visits 35   Date for PT Re-Evaluation 12/17/14   Authorization Type G-Code every 10 visits   PT Start Time 0848   PT Stop Time 0933   PT Time Calculation (min) 45 min   Equipment Utilized During Treatment Gait belt   Activity Tolerance Patient tolerated treatment well   Behavior During Therapy [facility] for tasks assessed/performed      Past Medical History  Diagnosis Date  . Hypertension   . Pneumonia     2012  . Heart murmur   . Glaucoma   . Multiple myeloma, without mention of having achieved remission 03/30/2012    Cytogenetic neg on 03/23/2012.  . Asthma   . Hyperparathyroidism, secondary renal ([place])   . Peripheral arterial disease ([place])   . End-stage renal disease on hemodialysis [facility])     Started HD March 2014.  Cause of ESRD was DM.  Gets HD at [facility] on [address] on MWF schedule.   . Thyroid disease     hyperparathyroidism  . MRSA bacteremia   . Anemia   . Peripheral vascular disease, unspecified ([place]) 11/19/2012    In the past had R foot toe amps then R TMA. In 2015 had left foot toe amp > then TMA >then L BKA on 05/14/13   . History of MRSA infection 04/22/2013    Bacteremia assoc w L foot wound infection Mar 2015   . Gangrene of foot ([place])   . ESRD on hemodialysis ([place])   . Diabetes mellitus with peripheral vascular disease ([place])   . Hypothyroidism   . Decubitus ulcer of sacral region, stage 2 03/07/2014  . Glaucoma 03/07/2014  . Gangrene ([place])     right BKA  . Contracture of joint     left knee    Past Surgical History  Procedure Laterality Date  . Thyroidectomy    .  Cervical disc surgery    . Insertion of dialysis catheter Right 03/19/2012    Procedure: INSERTION OF DIALYSIS CATHETER;  Surgeon: Mal Misty, MD;  Location: [place];  Service: Vascular;  Laterality: Right;  Right Internal Jugular  . Av fistula placement Left 03/25/2012    Procedure: ARTERIOVENOUS (AV) FISTULA CREATION;  Surgeon: Mal Misty, MD;  Location: [place];  Service: Vascular;  Laterality: Left;  . Ligation of competing branches of arteriovenous fistula Left 05/08/2012    Procedure: LIGATION OF COMPETING BRANCHES OF ARTERIOVENOUS FISTULA;  Surgeon: Mal Misty, MD;  Location: [place];  Service: Vascular;  Laterality: Left;  Ultrasound guided  . Cardiac catheterization      approx 30 years ago  . Amputation Right 11/10/2012    Procedure: AMPUTATION FIRST and SECOND TOES Right Foot;  Surgeon: Elam Dutch, MD;  Location[facility] OR;  Service: Vascular;  Laterality: Right;  . Transmetatarsal amputation Left 12/16/2012    Procedure: TRANSMETATARSAL AMPUTATION AND VAC PLACEMENT;  Surgeon: Elam Dutch, MD;  Location: [place];  Service: Vascular;  Laterality: Left;  . Amputation Left 04/07/2013    Procedure: AMPUTATION DIGIT- LEFT 1ST TOE;  Surgeon: Mal Misty, MD;  Location:  [facility] OR;  Service: Vascular;  Laterality: Left;  . Tee without cardioversion N/A 04/20/2013    Procedure: TRANSESOPHAGEAL ECHOCARDIOGRAM (TEE);  Surgeon: Josue Hector, MD;  Location: [facility] ENDOSCOPY;  Service: Cardiovascular;  Laterality: N/A;  . I&d extremity Left 04/22/2013    Procedure: JQGBEEFEOF HQR DEBRIDEMENT LEFT FIRST TOE AMPUTATION WOUND ;  Surgeon: Mal Misty, MD;  Location: [place];  Service: Vascular;  Laterality: Left;  . Amputation Left 04/26/2013    Procedure: Left Foot Transmetatarsal Amputation;  Surgeon: Newt Minion, MD;  Location: [place];  Service: Orthopedics;  Laterality: Left;  . Toe amputation      D/C 04-30-13  . Below knee leg amputation Left 05/14/2013    DR [name]  . Amputation Left 05/14/2013     Procedure: AMPUTATION BELOW KNEE;  Surgeon: Newt Minion, MD;  Location: [place];  Service: Orthopedics;  Laterality: Left;  Left Below Knee Amputation  . Eye surgery Bilateral     CATARACTS  . Amputation Left 07/23/2013    Procedure: AMPUTATION BELOW KNEE;  Surgeon: Newt Minion, MD;  Location: [place];  Service: Orthopedics;  Laterality: Left;  Left Below Knee Amputation Revision  . Abdominal aortagram Bilateral 11/06/2012    Procedure: ABDOMINAL AORTAGRAM;  Surgeon: Elam Dutch, MD;  Location: [facility] CATH LAB;  Service: Cardiovascular;  Laterality: Bilateral;  . I&d extremity Right 01/05/2014    Procedure: IRRIGATION AND DEBRIDEMENT Right Heel Ulcer;  Surgeon: Mcarthur Rossetti, MD;  Location: [place];  Service: Orthopedics;  Laterality: Right;  Surgeon Available after 5PM  . Below knee leg amputation Right 02/18/2014    dr duda  . Amputation Right 02/18/2014    Procedure: AMPUTATION BELOW KNEE;  Surgeon: Newt Minion, MD;  Location: [place];  Service: Orthopedics;  Laterality: Right;  . Stump revision Right 05/20/2014    Procedure: Revision Right Below Knee Amputation;  Surgeon: Newt Minion, MD;  Location: [place];  Service: Orthopedics;  Laterality: Right;  . Repair quadriceps/hamstring muscles Left 05/20/2014    Procedure: Left Hamstring Release;  Surgeon: Newt Minion, MD;  Location: [place];  Service: Orthopedics;  Laterality: Left;    There were no vitals filed for this visit.  Visit Diagnosis:  Weakness of both legs  Decreased functional activity tolerance  Abnormality of gait  Encounter for prosthetic gait training  Unsteadiness  Balance problems  Status post bilateral below knee amputation [facility])      Subjective Assessment - 11/01/14 0845    Subjective Patient reports wearing prostheses 4 hrs 2x/day on non-dialysis days and 4-6 hrs 1x/day on dialysis days. No issues. He is walking with daughter 1-2 x on non-dialysis days.    Currently in Pain? No/denies                          [facility] Adult PT Treatment/Exercise - 11/01/14 0845    Transfers   Sit to Stand 4: Min guard;With upper extremity assist;From chair/3-in-1;With armrests  to RW for stabilization   Sit to Stand Details Verbal cues for precautions/safety;Verbal cues for technique   Sit to Stand Details (indicate cue type and reason) tactile & verbal cues on wt shift anteriorly to get Center of Gravity over [place] of Support   Stand to Sit 4: Min guard;With armrests;To chair/3-in-1;With upper extremity assist  from RW for balance   Stand to Sit Details (indicate cue type and reason) Verbal cues for technique;Verbal cues for precautions/safety  Stand to Sit Details verbal cues on aligning feet prior to sitting & keeping wt over feet until he has lowered Center of Gravity   Ambulation/Gait   Ambulation/Gait Yes   Ambulation/Gait Assistance 4: Min guard   Ambulation/Gait Assistance Details verbal & tactile cues on posture & step length. Left prosthesis has pylon lean with Valgus knee movement. Prosthetist scheduled for next session.    Ambulation Distance (Feet) 39 Feet  39' X 2   Assistive device Prostheses;Rolling walker   Gait Pattern Step-through pattern;Decreased step length - right;Decreased step length - left;Decreased stride length;Right flexed knee in stance;Left flexed knee in stance;Decreased trunk rotation;Trunk flexed;Narrow base of support;Decreased hip/knee flexion - left;Decreased hip/knee flexion - right   Ambulation Surface Indoor;Level   Curb 3: Mod assist  2 people for safety, used 4" block not curb yet   Curb Details (indicate cue type and reason) PT demo, instructed in technique with bil. TTA prostheses. Patient performed 2 reps (1X leading RLE, 1X leading LLE)   Prosthetics   Prosthetic Care Comments  PT instructed in weighing prostheses for dialysis & subtracting weight of prostheses from weigh-in & weigh-out. Not making changes to dry weight. PT  instructed to increase wear to all awake hours except 1 hr mid-day or during nap after dialysis. During dialysis PT recommends removing prostheses only & leaving liners on limbs if improves comfort while recieving dialysis. Pt & dtr verbalized understanding   Current prosthetic wear tolerance (days/week)  7 days /wk   Current prosthetic wear tolerance (#hours/day)  increase to all awake hours except 1 hour mid-day   Residual limb condition  intact with no issues   Education Provided Residual limb care;Correct ply sock adjustment;Proper wear schedule/adjustment  see prosthetic care above   Person(s) Educated Patient;Child(ren)   Education Method Explanation;Verbal cues   Education Method Verbalized understanding;Verbal cues required;Needs further instruction   Donning Prosthesis Supervision   Doffing Prosthesis Supervision                  PT Short Term Goals - 10/18/14 4098    PT SHORT TERM GOAL #1   Title Patient donnes prostheses with cues only. (Target Date: 09/16/2014)   Baseline MET 09/22/2014   Status Achieved   PT SHORT TERM GOAL #2   Title Patient tolerates bilateral prostheses wear >6hrs total per day without skin issues or limb pain.  (Target Date: 09/16/2014)   Baseline MET 09/22/2014   Status Achieved   PT SHORT TERM GOAL #3   Title Squat-pivot transfer w/c to level mat modified independent with buttock clearance.  (Target Date: 09/16/2014)   Baseline 09/22/2014 MET with general cues to lift buttocks more   Status Achieved   PT SHORT TERM GOAL #4   Title Sit to / from stand w/c to parallel bars with moderate assist  (Target Date: 09/16/2014)   Baseline MET 09/13/2014   Status Achieved   PT SHORT TERM GOAL #5   Title Patient ambulates 10' in parallel bars with prostheses with max assist of 1 person with 2nd person supervising for safety.  (Target Date: 09/16/2014)   Baseline MET 09/13/2014   Time 1   Period Months   Status Achieved   PT SHORT TERM GOAL #6   Title Patient  tolerates wear of prostheses >90% of awake hours, at dialysis may remove prostheses for comfort, with PT guidance cues. (Target Date: 10/14/2014) NEW Target Date 11/17/2014   Baseline 10/11/14: pt has increased his wear time since last goal  check, however only up to about 75-80% of awake hours on consistent basis.    Status Not Met   PT SHORT TERM GOAL #7   Title Patient performs stand-pivot transfers w/c to chair with armrests with RW & prostheses with minimal assist. (Target Date: 10/14/2014)   Baseline 10/11/14: met today   Status Achieved   PT SHORT TERM GOAL #8   Title Patient ambulates 25' with RW & prostheses including turning to sit in chair with armrests with MinA. (Target Date: 10/14/2014)   Baseline 10/13/2014 MET    Status Achieved   PT SHORT TERM GOAL #9   TITLE Patient ambulates 49' with family assistance with RW & prosthesis with PT supervising / cueing for safety. (NEW Target Date 11/17/2014)   Baseline 10/18/14: limited walking at home with family assist at this time   Status Not Met   PT [place] #10   TITLE Patient able to maintain upright wtih RW support for 2 minutes & reach 3" anteriorly and to knee level with contact assist. (NEW Target Date 11/17/2014)   Baseline 10/18/14: needed min assist x2 due to posterior lean/balance loss, otherwise able to do all the above with min guard assist.   Period Months   Status Partially Met           PT Long Term Goals - 10/18/14 1509    PT LONG TERM GOAL #1   Title Tolerates wear of prostheses >90% of awake hours without change in skin integrity nor undue tenderness.  (Target Date: 12/16/2014)   Baseline 10/18/14: wearing 70-75% of awake hours   Status Not Met   PT LONG TERM GOAL #2   Title Patient demonstrates / verbalizes proper prosthetic care for safe use of prostheses without issues.  (Target Date: 12/16/2014)   Baseline 10/18/14: needs continued cues to increase wear time and on sock management   Status Partially Met   PT  LONG TERM GOAL #3   Title Sit to /from stand and stand-pivot transfers with RW & prostheses modified independent.  (Target Date: 12/16/2014)   Baseline 10/18/14: needs min assist to min guard assist   Status Not Met   PT LONG TERM GOAL #4   Title Standing balance with RW & prostheses manages clothes & reaches 5" with supervision.  (Target Date: 12/16/2014)   Baseline met on 10/18/14   Status Achieved   PT LONG TERM GOAL #5   Title Patient ambulates 77' with RW & prostheses with minimal assist from family safely.  (Target Date: 12/16/2014)   Baseline 10/18/14: pt with limited gait at home with family to date   Status Not Met               Plan - 11/01/14 0845    Clinical Impression Statement Patient and daughter appear to understand how to weigh and use prostheses at dialysis without interferring with his weights. Patient did well for first time on step (4") after PT instructed technique.    Pt will benefit from skilled therapeutic intervention in order to improve on the following deficits Abnormal gait;Decreased activity tolerance;Decreased balance;Decreased endurance;Decreased knowledge of precautions;Decreased knowledge of use of DME;Decreased mobility;Decreased range of motion;Decreased strength;Postural dysfunction;Prosthetic Dependency   Rehab Potential Good   Clinical Impairments Affecting Rehab Potential Dialysis limits strength and endurance with slow gains.    PT Frequency 2x / week   PT Duration Other (comment)  17 weeks (4 months - 120 days)   PT Treatment/Interventions ADLs/Self Care Home Management;DME Instruction;Gait  training;Functional mobility training;Therapeutic activities;Therapeutic exercise;Balance training;Neuromuscular re-education;Patient/family education;Prosthetic Training;Passive range of motion   PT Next Visit Plan Do G-code and progress note, Continue gait training, postural re-education; check leg/prosthetic length for possible adjustments due to difficulty  clearing LLE with gait;barriers with walker/prostheses   Consulted and Agree with Plan of Care Patient;Family member/caregiver   Family Member Consulted dtr        Problem List Patient Active Problem List   Diagnosis Date Noted  . Complications, amputation stump late ([place]) 05/20/2014  . Weakness generalized 04/07/2014  . Sepsis ([place]) 04/07/2014  . Decubitus ulcer, stage II 04/07/2014  . Decubitus ulcer of ankle   . Fatigue   . Wound infection ([place]) 04/06/2014  . Decubitus ulcer of sacral region, stage 2 03/07/2014  . Glaucoma 03/07/2014  . Gout 03/07/2014  . Below knee amputation status ([place]) 02/18/2014  . Osteomyelitis ([place]) 01/04/2014  . Phantom limb ([place]) 12/14/2013  . Type 1 diabetes mellitus with diabetic foot infection ([place])   . Diabetes mellitus with peripheral vascular disease ([place])   . Asthma 05/17/2013  . History of MRSA infection 04/22/2013  . Peripheral vascular disease ([place]) 11/19/2012  . End-stage renal disease on hemodialysis ([place]) 05/05/2012  . Kahler disease ([place]) 03/30/2012  . Anemia in chronic kidney disease 03/19/2012  . Hypothyroidism 03/19/2012  . Anemia, iron deficiency 03/19/2012  . Hypertension 03/18/2012  . Chronic kidney disease (CKD), stage V ([place]) 03/18/2012  . Cardiac conduction disorder 03/18/2012  . MGUS (monoclonal gammopathy of unknown significance) 02/28/2011  . Monoclonal paraproteinemia 02/28/2011    Jamey Reas PT, DPT 11/01/2014, 10:48 AM  [facility] [address] [place], [place], [address] Phone: [phone]   Fax:  [phone]  Name: Jack [name] MRN: [id number] Date of Birth: 1945-10-25

## 2014-11-08 ENCOUNTER — Encounter: Payer: Self-pay | Admitting: Physical Therapy

## 2014-11-08 ENCOUNTER — Ambulatory Visit: Payer: Medicare Other | Admitting: Physical Therapy

## 2014-11-08 DIAGNOSIS — R269 Unspecified abnormalities of gait and mobility: Secondary | ICD-10-CM

## 2014-11-08 DIAGNOSIS — R29898 Other symptoms and signs involving the musculoskeletal system: Secondary | ICD-10-CM

## 2014-11-08 DIAGNOSIS — R2689 Other abnormalities of gait and mobility: Secondary | ICD-10-CM

## 2014-11-08 DIAGNOSIS — Z4789 Encounter for other orthopedic aftercare: Secondary | ICD-10-CM

## 2014-11-08 DIAGNOSIS — R2681 Unsteadiness on feet: Secondary | ICD-10-CM

## 2014-11-08 DIAGNOSIS — R6889 Other general symptoms and signs: Secondary | ICD-10-CM

## 2014-11-08 NOTE — Therapy (Signed)
[facility] [address] [place], [place], [address] Phone: [phone]   Fax:  [phone]  Physical Therapy Treatment  Patient Details  Name: Jack [name] MRN: [id number] Date of Birth: [birth year] No Data Recorded  Encounter Date: 11/08/2014      PT End of Session - 11/08/14 0849    Visit Number 20   Number of Visits 35   Date for PT Re-Evaluation 12/17/14   Authorization Type G-Code every 10 visits   PT Start Time 0801   PT Stop Time 0845   PT Time Calculation (min) 44 min   Equipment Utilized During Treatment Gait belt   Activity Tolerance Patient tolerated treatment well   Behavior During Therapy [facility] for tasks assessed/performed      Past Medical History  Diagnosis Date  . Hypertension   . Pneumonia     2012  . Heart murmur   . Glaucoma   . Multiple myeloma, without mention of having achieved remission 03/30/2012    Cytogenetic neg on 03/23/2012.  . Asthma   . Hyperparathyroidism, secondary renal ([place])   . Peripheral arterial disease ([place])   . End-stage renal disease on hemodialysis [facility])     Started HD March 2014.  Cause of ESRD was DM.  Gets HD at [facility] on [place] on MWF schedule.   . Thyroid disease     hyperparathyroidism  . MRSA bacteremia   . Anemia   . Peripheral vascular disease, unspecified ([place]) 11/19/2012    In the past had R foot toe amps then R TMA. In 2015 had left foot toe amp > then TMA >then L BKA on 05/14/13   . History of MRSA infection 04/22/2013    Bacteremia assoc w L foot wound infection Mar 2015   . Gangrene of foot ([place])   . ESRD on hemodialysis ([place])   . Diabetes mellitus with peripheral vascular disease ([place])   . Hypothyroidism   . Decubitus ulcer of sacral region, stage 2 03/07/2014  . Glaucoma 03/07/2014  . Gangrene ([place])     right BKA  . Contracture of joint     left knee    Past Surgical History  Procedure Laterality Date  . Thyroidectomy    .  Cervical disc surgery    . Insertion of dialysis catheter Right 03/19/2012    Procedure: INSERTION OF DIALYSIS CATHETER;  Surgeon: Mal Misty, MD;  Location: [place];  Service: Vascular;  Laterality: Right;  Right Internal Jugular  . Av fistula placement Left 03/25/2012    Procedure: ARTERIOVENOUS (AV) FISTULA CREATION;  Surgeon: Mal Misty, MD;  Location: [place];  Service: Vascular;  Laterality: Left;  . Ligation of competing branches of arteriovenous fistula Left 05/08/2012    Procedure: LIGATION OF COMPETING BRANCHES OF ARTERIOVENOUS FISTULA;  Surgeon: Mal Misty, MD;  Location: [place];  Service: Vascular;  Laterality: Left;  Ultrasound guided  . Cardiac catheterization      approx 30 years ago  . Amputation Right 11/10/2012    Procedure: AMPUTATION FIRST and SECOND TOES Right Foot;  Surgeon: Elam Dutch, MD;  Location: [facility] OR;  Service: Vascular;  Laterality: Right;  . Transmetatarsal amputation Left 12/16/2012    Procedure: TRANSMETATARSAL AMPUTATION AND VAC PLACEMENT;  Surgeon: Elam Dutch, MD;  Location: [place];  Service: Vascular;  Laterality: Left;  . Amputation Left 04/07/2013    Procedure: AMPUTATION DIGIT- LEFT 1ST TOE;  Surgeon: Mal Misty, MD;  [facility] OR;  Service: Vascular;  Laterality: Left;  . Tee without cardioversion N/A 04/20/2013    Procedure: TRANSESOPHAGEAL ECHOCARDIOGRAM (TEE);  Surgeon: Josue Hector, MD;  Location: [facility] ENDOSCOPY;  Service: Cardiovascular;  Laterality: N/A;  . I&d extremity Left 04/22/2013    Procedure: QJJHERDEYC XKG DEBRIDEMENT LEFT FIRST TOE AMPUTATION WOUND ;  Surgeon: Mal Misty, MD;  Location: [place];  Service: Vascular;  Laterality: Left;  . Amputation Left 04/26/2013    Procedure: Left Foot Transmetatarsal Amputation;  Surgeon: Newt Minion, MD;  Location: [place];  Service: Orthopedics;  Laterality: Left;  . Toe amputation      D/C 04-30-13  . Below knee leg amputation Left 05/14/2013    DR [name]  . Amputation Left 05/14/2013     Procedure: AMPUTATION BELOW KNEE;  Surgeon: Newt Minion, MD;  Location: [place];  Service: Orthopedics;  Laterality: Left;  Left Below Knee Amputation  . Eye surgery Bilateral     CATARACTS  . Amputation Left 07/23/2013    Procedure: AMPUTATION BELOW KNEE;  Surgeon: Newt Minion, MD;  Location: [place];  Service: Orthopedics;  Laterality: Left;  Left Below Knee Amputation Revision  . Abdominal aortagram Bilateral 11/06/2012    Procedure: ABDOMINAL AORTAGRAM;  Surgeon: Elam Dutch, MD;  Location: [facility] CATH LAB;  Service: Cardiovascular;  Laterality: Bilateral;  . I&d extremity Right 01/05/2014    Procedure: IRRIGATION AND DEBRIDEMENT Right Heel Ulcer;  Surgeon: Mcarthur Rossetti, MD;  Location: [place] [facility];  Service: Orthopedics;  Laterality: Right;  Surgeon Available after 5PM  . Below knee leg amputation Right 02/18/2014    dr duda  . Amputation Right 02/18/2014    Procedure: AMPUTATION BELOW KNEE;  Surgeon: Newt Minion, MD;  Location: [place];  Service: Orthopedics;  Laterality: Right;  . Stump revision Right 05/20/2014    Procedure: Revision Right Below Knee Amputation;  Surgeon: Newt Minion, MD;  Location: [place];  Service: Orthopedics;  Laterality: Right;  . Repair quadriceps/hamstring muscles Left 05/20/2014    Procedure: Left Hamstring Release;  Surgeon: Newt Minion, MD;  Location: [place];  Service: Orthopedics;  Laterality: Left;    There were no vitals filed for this visit.  Visit Diagnosis:  Weakness of both legs  Decreased functional activity tolerance  Abnormality of gait  Encounter for prosthetic gait training  Unsteadiness  Balance problems  Activity intolerance      Subjective Assessment - 11/08/14 0807    Subjective No new complaints. No falls or pain to report.   Currently in Pain? No/denies   Pain Score 0-No pain          [facility] Adult PT Treatment/Exercise - 11/08/14 0808    Transfers   Sit to Stand 4: Min guard;With upper extremity assist;From  chair/3-in-1;With armrests   Sit to Stand Details Verbal cues for precautions/safety;Verbal cues for technique   Stand to Sit 4: Min guard;With armrests;To chair/3-in-1;With upper extremity assist   Stand to Sit Details (indicate cue type and reason) Verbal cues for technique;Verbal cues for precautions/safety   Ambulation/Gait   Ambulation/Gait Yes   Ambulation/Gait Assistance 4: Min guard   Ambulation/Gait Assistance Details cues on posture and to correct gait deviations .Adjustments made to bil prostheses through out session with pt reporting improvments afterwards                           Ambulation Distance (Feet) 25 Feet  x ;  46 feet x 2 with 90* turns each time   Assistive device Prostheses;Rolling walker   Gait Pattern Step-through pattern;Decreased step length - right;Decreased step length - left;Decreased stride length;Right flexed knee in stance;Left flexed knee in stance;Decreased trunk rotation;Trunk flexed;Narrow base of support;Decreased hip/knee flexion - left;Decreased hip/knee flexion - right   Ambulation Surface Level;Indoor   Prosthetics   Prosthetic Care Comments  Gerald Stabs (prosthetist) here to make adjustments    Current prosthetic wear tolerance (days/week)  7 days /wk   Current prosthetic wear tolerance (#hours/day)  increase to all awake hours except 1 hour mid-day   Residual limb condition  intact with no issues   Education Provided Residual limb care;Proper wear schedule/adjustment;Proper weight-bearing schedule/adjustment   Person(s) Educated Patient;Child(ren)  daughter   Education Method Explanation;Verbal cues   Education Method Verbalized understanding;Verbal cues required   Donning Prosthesis Supervision   Doffing Prosthesis Supervision            PT Short Term Goals - 10/18/14 0936    PT SHORT TERM GOAL #1   Title Patient donnes prostheses with cues only. (Target Date: 09/16/2014)   Baseline MET 09/22/2014   Status Achieved   PT SHORT TERM GOAL #2    Title Patient tolerates bilateral prostheses wear >6hrs total per day without skin issues or limb pain.  (Target Date: 09/16/2014)   Baseline MET 09/22/2014   Status Achieved   PT SHORT TERM GOAL #3   Title Squat-pivot transfer w/c to level mat modified independent with buttock clearance.  (Target Date: 09/16/2014)   Baseline 09/22/2014 MET with general cues to lift buttocks more   Status Achieved   PT SHORT TERM GOAL #4   Title Sit to / from stand w/c to parallel bars with moderate assist  (Target Date: 09/16/2014)   Baseline MET 09/13/2014   Status Achieved   PT SHORT TERM GOAL #5   Title Patient ambulates 10' in parallel bars with prostheses with max assist of 1 person with 2nd person supervising for safety.  (Target Date: 09/16/2014)   Baseline MET 09/13/2014   Time 1   Period Months   Status Achieved   PT SHORT TERM GOAL #6   Title Patient tolerates wear of prostheses >90% of awake hours, at dialysis may remove prostheses for comfort, with PT guidance cues. (Target Date: 10/14/2014) NEW Target Date 11/17/2014   Baseline 10/11/14: pt has increased his wear time since last goal check, however only up to about 75-80% of awake hours on consistent basis.    Status Not Met   PT SHORT TERM GOAL #7   Title Patient performs stand-pivot transfers w/c to chair with armrests with RW & prostheses with minimal assist. (Target Date: 10/14/2014)   Baseline 10/11/14: met today   Status Achieved   PT SHORT TERM GOAL #8   Title Patient ambulates 25' with RW & prostheses including turning to sit in chair with armrests with MinA. (Target Date: 10/14/2014)   Baseline 10/13/2014 MET    Status Achieved   PT SHORT TERM GOAL #9   TITLE Patient ambulates 54' with family assistance with RW & prosthesis with PT supervising / cueing for safety. (NEW Target Date 11/17/2014)   Baseline 10/18/14: limited walking at home with family assist at this time   Status Not Met   PT SHORT TERM GOAL #10   TITLE Patient able to maintain upright  wtih RW support for 2 minutes & reach 3" anteriorly and to knee level with contact assist. (NEW Target  Date 11/17/2014)   Baseline 10/18/14: needed min assist x2 due to posterior lean/balance loss, otherwise able to do all the above with min guard assist.   Period Months   Status Partially Met           PT Long Term Goals - 10/18/14 1509    PT LONG TERM GOAL #1   Title Tolerates wear of prostheses >90% of awake hours without change in skin integrity nor undue tenderness.  (Target Date: 12/16/2014)   Baseline 10/18/14: wearing 70-75% of awake hours   Status Not Met   PT LONG TERM GOAL #2   Title Patient demonstrates / verbalizes proper prosthetic care for safe use of prostheses without issues.  (Target Date: 12/16/2014)   Baseline 10/18/14: needs continued cues to increase wear time and on sock management   Status Partially Met   PT LONG TERM GOAL #3   Title Sit to /from stand and stand-pivot transfers with RW & prostheses modified independent.  (Target Date: 12/16/2014)   Baseline 10/18/14: needs min assist to min guard assist   Status Not Met   PT LONG TERM GOAL #4   Title Standing balance with RW & prostheses manages clothes & reaches 5" with supervision.  (Target Date: 12/16/2014)   Baseline met on 10/18/14   Status Achieved   PT LONG TERM GOAL #5   Title Patient ambulates 64' with RW & prostheses with minimal assist from family safely.  (Target Date: 12/16/2014)   Baseline 10/18/14: pt with limited gait at home with family to date   Status Not Met           Plan - 11/08/14 0849    Clinical Impression Statement Pt reported increased comfort and balance after having adjustments made to prostheses today in session. Pt also with increased overall gait distance today. Pt making steady progress toward goals.   Pt will benefit from skilled therapeutic intervention in order to improve on the following deficits Abnormal gait;Decreased activity tolerance;Decreased balance;Decreased  endurance;Decreased knowledge of precautions;Decreased knowledge of use of DME;Decreased mobility;Decreased range of motion;Decreased strength;Postural dysfunction;Prosthetic Dependency   Rehab Potential Good   Clinical Impairments Affecting Rehab Potential Dialysis limits strength and endurance with slow gains.    PT Frequency 2x / week   PT Duration Other (comment)  17 weeks (4 months - 120 days)   PT Treatment/Interventions ADLs/Self Care Home Management;DME Instruction;Gait training;Functional mobility training;Therapeutic activities;Therapeutic exercise;Balance training;Neuromuscular re-education;Patient/family education;Prosthetic Training;Passive range of motion   PT Next Visit Plan Continue gait training, postural re-education;barriers with walker/prostheses   Consulted and Agree with Plan of Care Patient;Family member/caregiver   Family Member Consulted dtr        Problem List Patient Active Problem List   Diagnosis Date Noted  . Complications, amputation stump late ([place]) 05/20/2014  . Weakness generalized 04/07/2014  . Sepsis ([place]) 04/07/2014  . Decubitus ulcer, stage II 04/07/2014  . Decubitus ulcer of ankle   . Fatigue   . Wound infection ([place]) 04/06/2014  . Decubitus ulcer of sacral region, stage 2 03/07/2014  . Glaucoma 03/07/2014  . Gout 03/07/2014  . Below knee amputation status ([place]) 02/18/2014  . Osteomyelitis ([place]) 01/04/2014  . Phantom limb ([place]) 12/14/2013  . Type 1 diabetes mellitus with diabetic foot infection ([place])   . Diabetes mellitus with peripheral vascular disease ([place])   . Asthma 05/17/2013  . History of MRSA infection 04/22/2013  . Peripheral vascular disease ([place]) 11/19/2012  . End-stage renal disease on hemodialysis ([place]) 05/05/2012  .  Kahler disease ([place]) 03/30/2012  . Anemia in chronic kidney disease 03/19/2012  . Hypothyroidism 03/19/2012  . Anemia, iron deficiency 03/19/2012  . Hypertension 03/18/2012  . Chronic kidney disease (CKD),  stage V ([place]) 03/18/2012  . Cardiac conduction disorder 03/18/2012  . MGUS (monoclonal gammopathy of unknown significance) 02/28/2011  . Monoclonal paraproteinemia 02/28/2011    Willow Ora 11/08/2014, 10:37 PM  Willow Ora, PTA, [facility] [address] [place], [place] [address] [phone] 11/08/2014, 10:37 PM   Name: JOVAUN LEVENE MRN: [id number] Date of Birth: 04-Apr-1945

## 2014-11-09 ENCOUNTER — Encounter: Payer: Self-pay | Admitting: Physical Therapy

## 2014-11-09 NOTE — Therapy (Signed)
[facility] [address] [place], [place], [address] Phone: [phone]   Fax:  [phone]  Patient Details  Name: Jack [name] MRN: [id number] Date of Birth: November 07, 1945 Referring Provider:  No ref. provider found  Encounter Date: 11/09/2014  Physical Therapy Progress Note  Dates of Reporting Period: 09/22/2014 to 11/08/2014  Objective Reports of Subjective Statement: Patient reports wearing prostheses >50% of awake hours on non-dialysis days and 2-4 hours after dialysis. He reports ambulating with family limited amounts and limited distances.  Objective Measurements: Wearing up to 8 hours on non dialysis days and 2-4 on dialysis days. No skin issues. Cues on sock management, weight bearing and to increase wear times required.   Goal Update:      PT Short Term Goals - 10/18/14 0936    PT SHORT TERM GOAL #1   Title Patient donnes prostheses with cues only. (Target Date: 09/16/2014)   Baseline MET 09/22/2014   Status Achieved   PT SHORT TERM GOAL #2   Title Patient tolerates bilateral prostheses wear >6hrs total per day without skin issues or limb pain.  (Target Date: 09/16/2014)   Baseline MET 09/22/2014   Status Achieved   PT SHORT TERM GOAL #3   Title Squat-pivot transfer w/c to level mat modified independent with buttock clearance.  (Target Date: 09/16/2014)   Baseline 09/22/2014 MET with general cues to lift buttocks more   Status Achieved   PT SHORT TERM GOAL #4   Title Sit to / from stand w/c to parallel bars with moderate assist  (Target Date: 09/16/2014)   Baseline MET 09/13/2014   Status Achieved   PT SHORT TERM GOAL #5   Title Patient ambulates 10' in parallel bars with prostheses with max assist of 1 person with 2nd person supervising for safety.  (Target Date: 09/16/2014)   Baseline MET 09/13/2014   Time 1   Period Months   Status Achieved   PT SHORT TERM GOAL #6   Title Patient tolerates wear of prostheses >90%  of awake hours, at dialysis may remove prostheses for comfort, with PT guidance cues. (Target Date: 10/14/2014) NEW Target Date 11/17/2014   Baseline 10/11/14: pt has increased his wear time since last goal check, however only up to about 75-80% of awake hours on consistent basis.    Status Not Met   PT SHORT TERM GOAL #7   Title Patient performs stand-pivot transfers w/c to chair with armrests with RW & prostheses with minimal assist. (Target Date: 10/14/2014)   Baseline 10/11/14: met today   Status Achieved   PT SHORT TERM GOAL #8   Title Patient ambulates 25' with RW & prostheses including turning to sit in chair with armrests with MinA. (Target Date: 10/14/2014)   Baseline 10/13/2014 MET    Status Achieved   PT SHORT TERM GOAL #9   TITLE Patient ambulates 29' with family assistance with RW & prosthesis with PT supervising / cueing for safety. (NEW Target Date 11/17/2014)   Baseline 10/18/14: limited walking at home with family assist at this time   Status Not Met   PT [place] #10   TITLE Patient able to maintain upright wtih RW support for 2 minutes & reach 3" anteriorly and to knee level with contact assist. (NEW Target Date 11/17/2014)   Baseline 10/18/14: needed min assist x2 due to posterior lean/balance loss, otherwise able to do all the above with min guard assist.   Period Months   Status Partially  Met      Plan: Prosthetic Training towards LTGs 2x/wk for 6 weeks  Reason Skilled Services are Required: Patient and family require skilled instructions to progress prostheses wear, care, standing balance and gait to work towards [place].    Jamey Reas PT, DPT 11/09/2014, 9:11 PM  [facility] [address] [place], [place], [address] Phone: [phone]   Fax:  [phone]

## 2014-11-10 ENCOUNTER — Ambulatory Visit: Payer: Medicare Other | Admitting: Physical Therapy

## 2014-11-10 ENCOUNTER — Encounter: Payer: Self-pay | Admitting: Physical Therapy

## 2014-11-10 DIAGNOSIS — R29898 Other symptoms and signs involving the musculoskeletal system: Secondary | ICD-10-CM

## 2014-11-10 DIAGNOSIS — R6889 Other general symptoms and signs: Secondary | ICD-10-CM

## 2014-11-10 DIAGNOSIS — R269 Unspecified abnormalities of gait and mobility: Secondary | ICD-10-CM

## 2014-11-10 DIAGNOSIS — Z4789 Encounter for other orthopedic aftercare: Secondary | ICD-10-CM

## 2014-11-10 DIAGNOSIS — Z89511 Acquired absence of right leg below knee: Secondary | ICD-10-CM

## 2014-11-10 DIAGNOSIS — R2689 Other abnormalities of gait and mobility: Secondary | ICD-10-CM

## 2014-11-10 DIAGNOSIS — R2681 Unsteadiness on feet: Secondary | ICD-10-CM

## 2014-11-10 DIAGNOSIS — Z89512 Acquired absence of left leg below knee: Secondary | ICD-10-CM

## 2014-11-10 NOTE — Therapy (Signed)
[facility] [address] [place], [place], [address] Phone: [phone]   Fax:  [phone]  Physical Therapy Treatment  Patient Details  Name: Jack [name] MRN: [id number] Date of Birth: 08-Sep-1945 Referring Provider: Meridee Score, MD  Encounter Date: 11/10/2014      PT End of Session - 11/10/14 0800    Visit Number 21   Number of Visits 35   Date for PT Re-Evaluation 12/17/14   Authorization Type G-Code every 10 visits   PT Start Time 0805   PT Stop Time 0845   PT Time Calculation (min) 40 min   Equipment Utilized During Treatment Gait belt   Activity Tolerance Patient tolerated treatment well   Behavior During Therapy [facility] for tasks assessed/performed      Past Medical History  Diagnosis Date  . Hypertension   . Pneumonia     2012  . Heart murmur   . Glaucoma   . Multiple myeloma, without mention of having achieved remission 03/30/2012    Cytogenetic neg on 03/23/2012.  . Asthma   . Hyperparathyroidism, secondary renal ([place])   . Peripheral arterial disease ([place])   . End-stage renal disease on hemodialysis [facility])     Started HD March 2014.  Cause of ESRD was DM.  Gets HD at [facility] on [address] on MWF schedule.   . Thyroid disease     hyperparathyroidism  . MRSA bacteremia   . Anemia   . Peripheral vascular disease, unspecified ([place]) 11/19/2012    In the past had R foot toe amps then R TMA. In 2015 had left foot toe amp > then TMA >then L BKA on 05/14/13   . History of MRSA infection 04/22/2013    Bacteremia assoc w L foot wound infection Mar 2015   . Gangrene of foot ([place])   . ESRD on hemodialysis ([place])   . Diabetes mellitus with peripheral vascular disease ([place])   . Hypothyroidism   . Decubitus ulcer of sacral region, stage 2 03/07/2014  . Glaucoma 03/07/2014  . Gangrene ([place])     right BKA  . Contracture of joint     left knee    Past Surgical History  Procedure Laterality Date  .  Thyroidectomy    . Cervical disc surgery    . Insertion of dialysis catheter Right 03/19/2012    Procedure: INSERTION OF DIALYSIS CATHETER;  Surgeon: Mal Misty, MD;  Location: [place];  Service: Vascular;  Laterality: Right;  Right Internal Jugular  . Av fistula placement Left 03/25/2012    Procedure: ARTERIOVENOUS (AV) FISTULA CREATION;  Surgeon: Mal Misty, MD;  Location: [place];  Service: Vascular;  Laterality: Left;  . Ligation of competing branches of arteriovenous fistula Left 05/08/2012    Procedure: LIGATION OF COMPETING BRANCHES OF ARTERIOVENOUS FISTULA;  Surgeon: Mal Misty, MD;  Location: [place];  Service: Vascular;  Laterality: Left;  Ultrasound guided  . Cardiac catheterization      approx 30 years ago  . Amputation Right 11/10/2012    Procedure: AMPUTATION FIRST and SECOND TOES Right Foot;  Surgeon: Elam Dutch, MD;  Location: [facility] OR;  Service: Vascular;  Laterality: Right;  . Transmetatarsal amputation Left 12/16/2012    Procedure: TRANSMETATARSAL AMPUTATION AND VAC PLACEMENT;  Surgeon: Elam Dutch, MD;  Location: [place];  Service: Vascular;  Laterality: Left;  . Amputation Left 04/07/2013    Procedure: AMPUTATION DIGIT- LEFT 1ST TOE;  Surgeon: Mal Misty, MD;  Location: [facility] OR;  Service: Vascular;  Laterality: Left;  . Tee without cardioversion N/A 04/20/2013    Procedure: TRANSESOPHAGEAL ECHOCARDIOGRAM (TEE);  Surgeon: Josue Hector, MD;  Location: [facility] ENDOSCOPY;  Service: Cardiovascular;  Laterality: N/A;  . I&d extremity Left 04/22/2013    Procedure: KSHNGITJLL VDI DEBRIDEMENT LEFT FIRST TOE AMPUTATION WOUND ;  Surgeon: Mal Misty, MD;  Location: [place];  Service: Vascular;  Laterality: Left;  . Amputation Left 04/26/2013    Procedure: Left Foot Transmetatarsal Amputation;  Surgeon: Newt Minion, MD;  Location: [place];  Service: Orthopedics;  Laterality: Left;  . Toe amputation      D/C 04-30-13  . Below knee leg amputation Left 05/14/2013    DR [name]  .  Amputation Left 05/14/2013    Procedure: AMPUTATION BELOW KNEE;  Surgeon: Newt Minion, MD;  Location: [place];  Service: Orthopedics;  Laterality: Left;  Left Below Knee Amputation  . Eye surgery Bilateral     CATARACTS  . Amputation Left 07/23/2013    Procedure: AMPUTATION BELOW KNEE;  Surgeon: Newt Minion, MD;  Location: [place];  Service: Orthopedics;  Laterality: Left;  Left Below Knee Amputation Revision  . Abdominal aortagram Bilateral 11/06/2012    Procedure: ABDOMINAL AORTAGRAM;  Surgeon: Elam Dutch, MD;  Location: [facility] CATH LAB;  Service: Cardiovascular;  Laterality: Bilateral;  . I&d extremity Right 01/05/2014    Procedure: IRRIGATION AND DEBRIDEMENT Right Heel Ulcer;  Surgeon: Mcarthur Rossetti, MD;  Location: [place];  Service: Orthopedics;  Laterality: Right;  Surgeon Available after 5PM  . Below knee leg amputation Right 02/18/2014    dr duda  . Amputation Right 02/18/2014    Procedure: AMPUTATION BELOW KNEE;  Surgeon: Newt Minion, MD;  Location: [place];  Service: Orthopedics;  Laterality: Right;  . Stump revision Right 05/20/2014    Procedure: Revision Right Below Knee Amputation;  Surgeon: Newt Minion, MD;  Location: [place];  Service: Orthopedics;  Laterality: Right;  . Repair quadriceps/hamstring muscles Left 05/20/2014    Procedure: Left Hamstring Release;  Surgeon: Newt Minion, MD;  Location: [place];  Service: Orthopedics;  Laterality: Left;    There were no vitals filed for this visit.  Visit Diagnosis:  Weakness of both legs  Decreased functional activity tolerance  Abnormality of gait  Encounter for prosthetic gait training  Unsteadiness  Balance problems  Status post bilateral below knee amputation [facility])          [facility] PT Assessment - 11/10/14 0805    Assessment   Referring Provider Meridee Score, MD                     [facility] Adult PT Treatment/Exercise - 11/10/14 0805    Transfers   Sit to Stand 4: Min guard;With upper extremity  assist;From chair/3-in-1;With armrests  to RW for stabilization   Sit to Stand Details Verbal cues for precautions/safety;Verbal cues for technique   Sit to Stand Details (indicate cue type and reason) cues on wt shift anteriorly to get center of gravity over base   Stand to Sit 4: Min guard;With armrests;To chair/3-in-1;With upper extremity assist  from RW    Stand to Sit Details (indicate cue type and reason) Verbal cues for technique;Verbal cues for precautions/safety   Stand to Sit Details Cues on lower wt prior to reach, shifting back to chair   Ambulation/Gait   Ambulation/Gait Yes   Ambulation/Gait Assistance 4: Min guard  Ambulation/Gait Assistance Details cues on posture and step length   Ambulation Distance (Feet) 55 Feet  55' X 2 and 15' X 1   Assistive device Prostheses;Rolling walker   Gait Pattern Step-through pattern;Decreased step length - right;Decreased step length - left;Decreased stride length;Right flexed knee in stance;Left flexed knee in stance;Decreased trunk rotation;Trunk flexed;Narrow base of support;Decreased hip/knee flexion - left;Decreased hip/knee flexion - right   Ambulation Surface Indoor;Level  carpet area, negotiating furniture   Curb 3: Mod assist  RW & prostheses on 4" block, 1x leading RLE & 1x LLE   Curb Details (indicate cue type and reason) Verbal cues on technique   Prosthetics   Prosthetic Care Comments  PT instructed to increase wear to all awake hours with drying prn; on dialysis days currently would not wear to dialysis as they are having difficulty with his BP but wear prostheses all awake hours after his nap.    Current prosthetic wear tolerance (days/week)  7 days /wk   Current prosthetic wear tolerance (#hours/day)  increase   Residual limb condition  intact with no issues   Education Provided Residual limb care;Proper wear schedule/adjustment;Proper weight-bearing schedule/adjustment   Person(s) Educated Patient;Child(ren)   Education  Method Explanation;Verbal cues   Education Method Verbalized understanding;Verbal cues required;Needs further instruction                PT Education - 11/10/14 0800    Education provided Yes   Education Details Increase activity level with increased frequency of gait in home, recommendation to sit on household furniture instead of w/c to facilitate increased activity level   Person(s) Educated Patient;Child(ren)   Methods Explanation;Verbal cues   Comprehension Verbalized understanding;Verbal cues required;Need further instruction          PT Short Term Goals - 10/18/14 0936    PT SHORT TERM GOAL #1   Title Patient donnes prostheses with cues only. (Target Date: 09/16/2014)   Baseline MET 09/22/2014   Status Achieved   PT SHORT TERM GOAL #2   Title Patient tolerates bilateral prostheses wear >6hrs total per day without skin issues or limb pain.  (Target Date: 09/16/2014)   Baseline MET 09/22/2014   Status Achieved   PT SHORT TERM GOAL #3   Title Squat-pivot transfer w/c to level mat modified independent with buttock clearance.  (Target Date: 09/16/2014)   Baseline 09/22/2014 MET with general cues to lift buttocks more   Status Achieved   PT SHORT TERM GOAL #4   Title Sit to / from stand w/c to parallel bars with moderate assist  (Target Date: 09/16/2014)   Baseline MET 09/13/2014   Status Achieved   PT SHORT TERM GOAL #5   Title Patient ambulates 10' in parallel bars with prostheses with max assist of 1 person with 2nd person supervising for safety.  (Target Date: 09/16/2014)   Baseline MET 09/13/2014   Time 1   Period Months   Status Achieved   PT SHORT TERM GOAL #6   Title Patient tolerates wear of prostheses >90% of awake hours, at dialysis may remove prostheses for comfort, with PT guidance cues. (Target Date: 10/14/2014) NEW Target Date 11/17/2014   Baseline 10/11/14: pt has increased his wear time since last goal check, however only up to about 75-80% of awake hours on consistent  basis.    Status Not Met   PT SHORT TERM GOAL #7   Title Patient performs stand-pivot transfers w/c to chair with armrests with RW & prostheses with minimal assist. (  Target Date: 10/14/2014)   Baseline 10/11/14: met today   Status Achieved   PT SHORT TERM GOAL #8   Title Patient ambulates 25' with RW & prostheses including turning to sit in chair with armrests with MinA. (Target Date: 10/14/2014)   Baseline 10/13/2014 MET    Status Achieved   PT SHORT TERM GOAL #9   TITLE Patient ambulates 47' with family assistance with RW & prosthesis with PT supervising / cueing for safety. (NEW Target Date 11/17/2014)   Baseline 10/18/14: limited walking at home with family assist at this time   Status Not Met   PT [place] #10   TITLE Patient able to maintain upright wtih RW support for 2 minutes & reach 3" anteriorly and to knee level with contact assist. (NEW Target Date 11/17/2014)   Baseline 10/18/14: needed min assist x2 due to posterior lean/balance loss, otherwise able to do all the above with min guard assist.   Period Months   Status Partially Met           PT Long Term Goals - 10/18/14 1509    PT LONG TERM GOAL #1   Title Tolerates wear of prostheses >90% of awake hours without change in skin integrity nor undue tenderness.  (Target Date: 12/16/2014)   Baseline 10/18/14: wearing 70-75% of awake hours   Status Not Met   PT LONG TERM GOAL #2   Title Patient demonstrates / verbalizes proper prosthetic care for safe use of prostheses without issues.  (Target Date: 12/16/2014)   Baseline 10/18/14: needs continued cues to increase wear time and on sock management   Status Partially Met   PT LONG TERM GOAL #3   Title Sit to /from stand and stand-pivot transfers with RW & prostheses modified independent.  (Target Date: 12/16/2014)   Baseline 10/18/14: needs min assist to min guard assist   Status Not Met   PT LONG TERM GOAL #4   Title Standing balance with RW & prostheses manages clothes &  reaches 5" with supervision.  (Target Date: 12/16/2014)   Baseline met on 10/18/14   Status Achieved   PT LONG TERM GOAL #5   Title Patient ambulates 77' with RW & prostheses with minimal assist from family safely.  (Target Date: 12/16/2014)   Baseline 10/18/14: pt with limited gait at home with family to date   Status Not Met               Plan - 11/10/14 0805    Clinical Impression Statement Patient needs less assistance for gait. Patient understands recommendation to increase activity level in home with increased frequency of gait.    Pt will benefit from skilled therapeutic intervention in order to improve on the following deficits Abnormal gait;Decreased activity tolerance;Decreased balance;Decreased endurance;Decreased knowledge of precautions;Decreased knowledge of use of DME;Decreased mobility;Decreased [name] of motion;Decreased strength;Postural dysfunction;Prosthetic Dependency   Rehab Potential Good   Clinical Impairments Affecting Rehab Potential Dialysis limits strength and endurance with slow gains.    PT Frequency 2x / week   PT Duration Other (comment)  17 weeks (4 months - 120 days)   PT Treatment/Interventions ADLs/Self Care Home Management;DME Instruction;Gait training;Functional mobility training;Therapeutic activities;Therapeutic exercise;Balance training;Neuromuscular re-education;Patient/family education;Prosthetic Training;Passive [name] of motion   PT Next Visit Plan Assess STGs, work on car transfers with RW & prostheses, Continue gait training, postural re-education;barriers with walker/prostheses   Consulted and Agree with Plan of Care Patient;Family member/caregiver   Family Member Consulted dtr  Problem List Patient Active Problem List   Diagnosis Date Noted  . Complications, amputation stump late ([place]) 05/20/2014  . Weakness generalized 04/07/2014  . Sepsis ([place]) 04/07/2014  . Decubitus ulcer, stage II 04/07/2014  . Decubitus ulcer of ankle    . Fatigue   . Wound infection ([place]) 04/06/2014  . Decubitus ulcer of sacral region, stage 2 03/07/2014  . Glaucoma 03/07/2014  . Gout 03/07/2014  . Below knee amputation status ([place]) 02/18/2014  . Osteomyelitis ([place]) 01/04/2014  . Phantom limb ([place]) 12/14/2013  . Type 1 diabetes mellitus with diabetic foot infection ([place])   . Diabetes mellitus with peripheral vascular disease ([place])   . Asthma 05/17/2013  . History of MRSA infection 04/22/2013  . Peripheral vascular disease ([place]) 11/19/2012  . End-stage renal disease on hemodialysis ([place]) 05/05/2012  . Kahler disease ([place]) 03/30/2012  . Anemia in chronic kidney disease 03/19/2012  . Hypothyroidism 03/19/2012  . Anemia, iron deficiency 03/19/2012  . Hypertension 03/18/2012  . Chronic kidney disease (CKD), stage V ([place]) 03/18/2012  . Cardiac conduction disorder 03/18/2012  . MGUS (monoclonal gammopathy of unknown significance) 02/28/2011  . Monoclonal paraproteinemia 02/28/2011    Jamey Reas PT, DPT 11/10/2014, 10:19 PM  [facility] [address] [place], [place], [address] Phone: [phone]   Fax:  [phone]  Name: Jack [name] MRN: [id number] Date of Birth: 1945/07/06

## 2014-11-15 ENCOUNTER — Encounter: Payer: Self-pay | Admitting: Physical Therapy

## 2014-11-15 ENCOUNTER — Ambulatory Visit: Payer: Medicare Other | Attending: Orthopedic Surgery | Admitting: Physical Therapy

## 2014-11-15 DIAGNOSIS — Z4789 Encounter for other orthopedic aftercare: Secondary | ICD-10-CM

## 2014-11-15 DIAGNOSIS — M25659 Stiffness of unspecified hip, not elsewhere classified: Secondary | ICD-10-CM

## 2014-11-15 DIAGNOSIS — Z89512 Acquired absence of left leg below knee: Secondary | ICD-10-CM | POA: Diagnosis present

## 2014-11-15 DIAGNOSIS — R269 Unspecified abnormalities of gait and mobility: Secondary | ICD-10-CM | POA: Diagnosis present

## 2014-11-15 DIAGNOSIS — M24669 Ankylosis, unspecified knee: Secondary | ICD-10-CM | POA: Diagnosis present

## 2014-11-15 DIAGNOSIS — R2689 Other abnormalities of gait and mobility: Secondary | ICD-10-CM

## 2014-11-15 DIAGNOSIS — Z89511 Acquired absence of right leg below knee: Secondary | ICD-10-CM | POA: Diagnosis present

## 2014-11-15 DIAGNOSIS — Z5189 Encounter for other specified aftercare: Secondary | ICD-10-CM | POA: Diagnosis present

## 2014-11-15 DIAGNOSIS — M25669 Stiffness of unspecified knee, not elsewhere classified: Secondary | ICD-10-CM

## 2014-11-15 DIAGNOSIS — R2681 Unsteadiness on feet: Secondary | ICD-10-CM

## 2014-11-15 DIAGNOSIS — M24659 Ankylosis, unspecified hip: Secondary | ICD-10-CM | POA: Diagnosis present

## 2014-11-15 DIAGNOSIS — Z89431 Acquired absence of right foot: Secondary | ICD-10-CM

## 2014-11-15 DIAGNOSIS — R6889 Other general symptoms and signs: Secondary | ICD-10-CM | POA: Diagnosis present

## 2014-11-15 DIAGNOSIS — R29818 Other symptoms and signs involving the nervous system: Secondary | ICD-10-CM | POA: Diagnosis present

## 2014-11-15 DIAGNOSIS — R29898 Other symptoms and signs involving the musculoskeletal system: Secondary | ICD-10-CM | POA: Diagnosis present

## 2014-11-15 NOTE — Patient Instructions (Signed)
Pt. Instructed on importance of RW spacing from BOS and proper hand placement on stand>sit to unsupported chair.  Early posterior weight shift noted on stand to sit.  Pt. Instructed on residual limb care, and proper wear time of 90% goal.  Pt. Wear time currently is 75%.

## 2014-11-15 NOTE — Therapy (Signed)
[facility] [address] [place], [place], [address] Phone: [phone]   Fax:  [phone]  Physical Therapy Treatment  Patient Details  Name: Jack [name] MRN: [id number] Date of Birth: 1945-09-10 Referring Provider: Meridee Score, MD  Encounter Date: 11/15/2014      PT End of Session - 11/15/14 0906    Visit Number 22   Number of Visits 35   Date for PT Re-Evaluation 12/17/14   Authorization Type G-Code every 10 visits   PT Start Time 0804   PT Stop Time 0848   PT Time Calculation (min) 44 min   Equipment Utilized During Treatment Gait belt   Activity Tolerance Patient tolerated treatment well   Behavior During Therapy [facility] for tasks assessed/performed      Past Medical History  Diagnosis Date  . Hypertension   . Pneumonia     2012  . Heart murmur   . Glaucoma   . Multiple myeloma, without mention of having achieved remission 03/30/2012    Cytogenetic neg on 03/23/2012.  . Asthma   . Hyperparathyroidism, secondary renal ([place])   . Peripheral arterial disease ([place])   . End-stage renal disease on hemodialysis [facility])     Started HD March 2014.  Cause of ESRD was DM.  Gets HD at [facility] on [place] on MWF schedule.   . Thyroid disease     hyperparathyroidism  . MRSA bacteremia   . Anemia   . Peripheral vascular disease, unspecified ([place]) 11/19/2012    In the past had R foot toe amps then R TMA. In 2015 had left foot toe amp > then TMA >then L BKA on 05/14/13   . History of MRSA infection 04/22/2013    Bacteremia assoc w L foot wound infection Mar 2015   . Gangrene of foot ([place])   . ESRD on hemodialysis ([place])   . Diabetes mellitus with peripheral vascular disease ([place])   . Hypothyroidism   . Decubitus ulcer of sacral region, stage 2 03/07/2014  . Glaucoma 03/07/2014  . Gangrene ([place])     right BKA  . Contracture of joint     left knee    Past Surgical History  Procedure Laterality Date  .  Thyroidectomy    . Cervical disc surgery    . Insertion of dialysis catheter Right 03/19/2012    Procedure: INSERTION OF DIALYSIS CATHETER;  Surgeon: Mal Misty, MD;  Location: [place];  Service: Vascular;  Laterality: Right;  Right Internal Jugular  . Av fistula placement Left 03/25/2012    Procedure: ARTERIOVENOUS (AV) FISTULA CREATION;  Surgeon: Mal Misty, MD;  Location: [place];  Service: Vascular;  Laterality: Left;  . Ligation of competing branches of arteriovenous fistula Left 05/08/2012    Procedure: LIGATION OF COMPETING BRANCHES OF ARTERIOVENOUS FISTULA;  Surgeon: Mal Misty, MD;  Location: [place];  Service: Vascular;  Laterality: Left;  Ultrasound guided  . Cardiac catheterization      approx 30 years ago  . Amputation Right 11/10/2012    Procedure: AMPUTATION FIRST and SECOND TOES Right Foot;  Surgeon: Elam Dutch, MD;  Location: [facility] OR;  Service: Vascular;  Laterality: Right;  . Transmetatarsal amputation Left 12/16/2012    Procedure: TRANSMETATARSAL AMPUTATION AND VAC PLACEMENT;  Surgeon: Elam Dutch, MD;  Location: [place];  Service: Vascular;  Laterality: Left;  . Amputation Left 04/07/2013    Procedure: AMPUTATION DIGIT- LEFT 1ST TOE;  Surgeon: Mal Misty, MD;  Location: [facility] OR;  Service: Vascular;  Laterality: Left;  . Tee without cardioversion N/A 04/20/2013    Procedure: TRANSESOPHAGEAL ECHOCARDIOGRAM (TEE);  Surgeon: Josue Hector, MD;  Location: [facility] ENDOSCOPY;  Service: Cardiovascular;  Laterality: N/A;  . I&d extremity Left 04/22/2013    Procedure: MBTDHRCBUL AGT DEBRIDEMENT LEFT FIRST TOE AMPUTATION WOUND ;  Surgeon: Mal Misty, MD;  Location: [place];  Service: Vascular;  Laterality: Left;  . Amputation Left 04/26/2013    Procedure: Left Foot Transmetatarsal Amputation;  Surgeon: Newt Minion, MD;  Location: [place];  Service: Orthopedics;  Laterality: Left;  . Toe amputation      D/C 04-30-13  . Below knee leg amputation Left 05/14/2013    DR [name]  .  Amputation Left 05/14/2013    Procedure: AMPUTATION BELOW KNEE;  Surgeon: Newt Minion, MD;  Location: [place];  Service: Orthopedics;  Laterality: Left;  Left Below Knee Amputation  . Eye surgery Bilateral     CATARACTS  . Amputation Left 07/23/2013    Procedure: AMPUTATION BELOW KNEE;  Surgeon: Newt Minion, MD;  Location: [place];  Service: Orthopedics;  Laterality: Left;  Left Below Knee Amputation Revision  . Abdominal aortagram Bilateral 11/06/2012    Procedure: ABDOMINAL AORTAGRAM;  Surgeon: Elam Dutch, MD;  Location: [facility] CATH LAB;  Service: Cardiovascular;  Laterality: Bilateral;  . I&d extremity Right 01/05/2014    Procedure: IRRIGATION AND DEBRIDEMENT Right Heel Ulcer;  Surgeon: Mcarthur Rossetti, MD;  Location: [place];  Service: Orthopedics;  Laterality: Right;  Surgeon Available after 5PM  . Below knee leg amputation Right 02/18/2014    dr duda  . Amputation Right 02/18/2014    Procedure: AMPUTATION BELOW KNEE;  Surgeon: Newt Minion, MD;  Location: [place];  Service: Orthopedics;  Laterality: Right;  . Stump revision Right 05/20/2014    Procedure: Revision Right Below Knee Amputation;  Surgeon: Newt Minion, MD;  Location: [place];  Service: Orthopedics;  Laterality: Right;  . Repair quadriceps/hamstring muscles Left 05/20/2014    Procedure: Left Hamstring Release;  Surgeon: Newt Minion, MD;  Location: [place];  Service: Orthopedics;  Laterality: Left;    There were no vitals filed for this visit.  Visit Diagnosis:  Weakness of both legs  Decreased functional activity tolerance  Abnormality of gait  Encounter for prosthetic gait training  Unsteadiness  Balance problems  Status post bilateral below knee amputation (HCC)  Activity intolerance  Decreased ROM of lower extremity  Decreased range of knee movement, unspecified laterality  Decreased range of hip movement, unspecified laterality  S/P transmetatarsal amputation of foot, right (HCC)  Status post below knee  amputation of left lower extremity (HCC)      Subjective Assessment - 11/15/14 0905    Subjective No new complaints. No falls or pain to report.  Pt. reports wearing prosthesis 9 hours of the 12 hours he is awake ~ 75% of awake hours.  Pt. seen with daughter present.         11/15/14 0001  Transfers  Sit to Stand 4: Min guard;With upper extremity assist;From chair/3-in-1;With armrests  Sit to Stand Details Verbal cues for precautions/safety;Verbal cues for technique  Sit to Stand Details (indicate cue type and reason) Frequent cues for anterior weight shift, hand placement, and four basics of sit to stand.    Stand to Sit 4: Min guard;With armrests;To chair/3-in-1;With upper extremity assist  Stand to Sit Details (indicate cue type and reason) Verbal cues  for technique;Verbal cues for precautions/safety  Stand to Sit Details Cues to avoid early posterior weight shift before RW and BOS are next to chair   Ambulation/Gait  Ambulation/Gait Yes  Ambulation/Gait Assistance 4: Min guard  Ambulation Distance (Feet) 130 Feet  Assistive device Prostheses;Rolling walker  Gait Pattern Step-through pattern;Decreased step length - right;Decreased step length - left;Decreased stride length;Right flexed knee in stance;Left flexed knee in stance;Decreased trunk rotation;Trunk flexed;Narrow base of support;Decreased hip/knee flexion - left;Decreased hip/knee flexion - right  Ambulation Surface Level;Indoor;Outdoor  [employer] Not tested (comment)  Doctor, hospital Yes  Wheelchair Assistance 6: Modified independent (Device/Increase time)  Distance 100     Car transfer: -Standing pivot transfer WC<>RW<>car (passengers seat); verbal cues required for proper hand placement, RW spacing, and general technique, along with min assist.  Therapeutic Exercise: -Sitting manually resisted LAQ 2 x 10 reps   Gait/ambulation training: -patient ambulated 132f with RW min guard for safety  composed of four trials of 27 ft each with rest periods of ~ 2 min each; patient required verbal cues for proper hand placement at each trial with pivot stand > sit and to avoid premature posterior wt. Shift.             PT Short Term Goals - 11/15/14 0913    PT SHORT TERM GOAL #1   Title Patient donnes prostheses with cues only. (Target Date: 09/16/2014)   Baseline MET 09/22/2014   Status Achieved   PT SHORT TERM GOAL #2   Title Patient tolerates bilateral prostheses wear >6hrs total per day without skin issues or limb pain.  (Target Date: 09/16/2014)   Baseline MET 09/22/2014   Status Achieved   PT SHORT TERM GOAL #3   Title Squat-pivot transfer w/c to level mat modified independent with buttock clearance.  (Target Date: 09/16/2014)   Baseline 09/22/2014 MET with general cues to lift buttocks more   Status Achieved   PT SHORT TERM GOAL #4   Title Sit to / from stand w/c to parallel bars with moderate assist  (Target Date: 09/16/2014)   Baseline MET 09/13/2014   Status Achieved   PT SHORT TERM GOAL #5   Title Patient ambulates 10' in parallel bars with prostheses with max assist of 1 person with 2nd person supervising for safety.  (Target Date: 09/16/2014)   Baseline MET 09/13/2014   Status Achieved   PT SHORT TERM GOAL #6   Title Patient tolerates wear of prostheses >90% of awake hours, at dialysis may remove prostheses for comfort, with PT guidance cues. (Target Date: 10/14/2014) NEW Target Date 11/17/2014   Baseline 11/15/14: Pt. tolerates wear of prosthesis 9 hrs of 12 hr awake daily.     Status Not Met   PT SHORT TERM GOAL #7   Title Patient performs stand-pivot transfers w/c to chair with armrests with RW & prostheses with minimal assist. (Target Date: 10/14/2014)   Baseline 10/11/14: met today   Status Achieved   PT SHORT TERM GOAL #8   Title Patient ambulates 25' with RW & prostheses including turning to sit in chair with armrests with MinA. (Target Date: 10/14/2014)   Baseline 10/13/2014 MET     Status Achieved   PT SHORT TERM GOAL #9   TITLE Patient ambulates 339 with family assistance with RW & prosthesis with PT supervising / cueing for safety. (NEW Target Date 11/17/2014)   Baseline 11/15/14: per patient/daughter walking 15-20' at home.    Status Partially Met   PT SHORT  TERM GOAL #10   TITLE Patient able to maintain upright wtih RW support for 2 minutes & reach 3" anteriorly and to knee level with contact assist. (NEW Target Date 11/17/2014)   Baseline 10/18/14: needed min assist x2 due to posterior lean/balance loss, otherwise able to do all the above with min guard assist.   Status On-going           PT Long Term Goals - 11/16/14 0853    PT LONG TERM GOAL #1   Title Tolerates wear of prostheses >90% of awake hours without change in skin integrity nor undue tenderness.  (Target Date: 12/16/2014)   Baseline 10/18/14: wearing 70-75% of awake hours   Status Not Met   PT LONG TERM GOAL #2   Title Patient demonstrates / verbalizes proper prosthetic care for safe use of prostheses without issues.  (Target Date: 12/16/2014)   Baseline 10/18/14: needs continued cues to increase wear time and on sock management   Status Partially Met   PT LONG TERM GOAL #3   Title Sit to /from stand and stand-pivot transfers with RW & prostheses modified independent.  (Target Date: 12/16/2014)   Baseline 10/18/14: needs min assist to min guard assist   Status Not Met   PT LONG TERM GOAL #4   Title Standing balance with RW & prostheses manages clothes & reaches 5" with supervision.  (Target Date: 12/16/2014)   Baseline met on 10/18/14   Status Achieved   PT LONG TERM GOAL #5   Title Patient ambulates 53' with RW & prostheses with minimal assist from family safely.  (Target Date: 12/16/2014)   Baseline 10/18/14: pt with limited gait at home with family to date   Status Not Met           Plan - 11/16/14 1132    Clinical Impression Statement Pt. continues to requires min guard during gait but needs  frequent reminders for upright posture and RRW spacing from BOS.  Decreased knowledge of safety precautions continues to be evident with premature posterior wt. shift on stand > sit to unsupported surfaces.  Proper sit<> stand technique was instructed to patient with a recommendation to daughter to reinforce with home transfers.     Rehab Potential Good   Clinical Impairments Affecting Rehab Potential Dialysis limits strength and endurance with slow gains.    PT Frequency 2x / week   PT Duration Other (comment)  17 weeks (4 months - 120 days)   PT Treatment/Interventions ADLs/Self Care Home Management;DME Instruction;Gait training;Functional mobility training;Therapeutic activities;Therapeutic exercise;Balance training;Neuromuscular re-education;Patient/family education;Prosthetic Training;Passive range of motion   PT Next Visit Plan Continue gait training, postural re-education;barriers with walker/prostheses.  Reinforcement of RW spacing,hand placement, and proper weight shift during transfers.     Consulted and Agree with Plan of Care Patient;Family member/caregiver   Family Member Consulted dtr        Problem List Patient Active Problem List   Diagnosis Date Noted  . Complications, amputation stump late ([place]) 05/20/2014  . Weakness generalized 04/07/2014  . Sepsis ([place]) 04/07/2014  . Decubitus ulcer, stage II 04/07/2014  . Decubitus ulcer of ankle   . Fatigue   . Wound infection ([place]) 04/06/2014  . Decubitus ulcer of sacral region, stage 2 03/07/2014  . Glaucoma 03/07/2014  . Gout 03/07/2014  . Below knee amputation status ([place]) 02/18/2014  . Osteomyelitis ([place]) 01/04/2014  . Phantom limb ([place]) 12/14/2013  . Type 1 diabetes mellitus with diabetic foot infection ([place])   . Diabetes mellitus  with peripheral vascular disease ([place])   . Asthma 05/17/2013  . History of MRSA infection 04/22/2013  . Peripheral vascular disease ([place]) 11/19/2012  . End-stage renal disease on hemodialysis  ([place]) 05/05/2012  . Kahler disease ([place]) 03/30/2012  . Anemia in chronic kidney disease 03/19/2012  . Hypothyroidism 03/19/2012  . Anemia, iron deficiency 03/19/2012  . Hypertension 03/18/2012  . Chronic kidney disease (CKD), stage V ([place]) 03/18/2012  . Cardiac conduction disorder 03/18/2012  . MGUS (monoclonal gammopathy of unknown significance) 02/28/2011  . Monoclonal paraproteinemia 02/28/2011    Bess Harvest 11/16/2014, 11:35 AM  Bess Harvest, [place]  Name: TERRELL OSTRAND MRN: [id number] Date of Birth: 1945-10-06  This note has been reviewed and edited by supervision CI.  Willow Ora, PTA, [place] [address] [place], [place] [address] [phone] 11/16/2014, 11:48 AM

## 2014-11-17 ENCOUNTER — Encounter: Payer: Medicare Other | Admitting: Physical Therapy

## 2014-11-22 ENCOUNTER — Ambulatory Visit: Payer: Medicare Other | Admitting: Physical Therapy

## 2014-11-22 ENCOUNTER — Encounter: Payer: Self-pay | Admitting: Physical Therapy

## 2014-11-22 DIAGNOSIS — R6889 Other general symptoms and signs: Secondary | ICD-10-CM

## 2014-11-22 DIAGNOSIS — R269 Unspecified abnormalities of gait and mobility: Secondary | ICD-10-CM

## 2014-11-22 DIAGNOSIS — R2681 Unsteadiness on feet: Secondary | ICD-10-CM

## 2014-11-22 DIAGNOSIS — Z4789 Encounter for other orthopedic aftercare: Secondary | ICD-10-CM

## 2014-11-22 DIAGNOSIS — R29898 Other symptoms and signs involving the musculoskeletal system: Secondary | ICD-10-CM | POA: Diagnosis not present

## 2014-11-22 DIAGNOSIS — R2689 Other abnormalities of gait and mobility: Secondary | ICD-10-CM

## 2014-11-22 DIAGNOSIS — Z89512 Acquired absence of left leg below knee: Secondary | ICD-10-CM

## 2014-11-22 DIAGNOSIS — Z89511 Acquired absence of right leg below knee: Secondary | ICD-10-CM

## 2014-11-22 DIAGNOSIS — M25669 Stiffness of unspecified knee, not elsewhere classified: Secondary | ICD-10-CM

## 2014-11-22 NOTE — Therapy (Signed)
[facility] [address] [place], [place], [address] Phone: [phone]   Fax:  [phone]  Physical Therapy Treatment  Patient Details  Name: Jack [name] MRN: [id number] Date of Birth: January 21, 1945 Referring Provider: Meridee Score, MD  Encounter Date: 11/22/2014      PT End of Session - 11/22/14 1332    Visit Number 23   Number of Visits 35   Date for PT Re-Evaluation 12/17/14   Authorization Type G-Code every 10 visits   PT Start Time 0804   PT Stop Time 0848   PT Time Calculation (min) 44 min   Equipment Utilized During Treatment Gait belt   Activity Tolerance Patient tolerated treatment well   Behavior During Therapy [facility] for tasks assessed/performed      Past Medical History  Diagnosis Date  . Hypertension   . Pneumonia     2012  . Heart murmur   . Glaucoma   . Multiple myeloma, without mention of having achieved remission 03/30/2012    Cytogenetic neg on 03/23/2012.  . Asthma   . Hyperparathyroidism, secondary renal ([place])   . Peripheral arterial disease ([place])   . End-stage renal disease on hemodialysis [facility])     Started HD March 2014.  Cause of ESRD was DM.  Gets HD at [facility] on [address] on MWF schedule.   . Thyroid disease     hyperparathyroidism  . MRSA bacteremia   . Anemia   . Peripheral vascular disease, unspecified ([place]) 11/19/2012    In the past had R foot toe amps then R TMA. In 2015 had left foot toe amp > then TMA >then L BKA on 05/14/13   . History of MRSA infection 04/22/2013    Bacteremia assoc w L foot wound infection Mar 2015   . Gangrene of foot ([place])   . ESRD on hemodialysis ([place])   . Diabetes mellitus with peripheral vascular disease ([place])   . Hypothyroidism   . Decubitus ulcer of sacral region, stage 2 03/07/2014  . Glaucoma 03/07/2014  . Gangrene ([place])     right BKA  . Contracture of joint     left knee    Past Surgical History  Procedure Laterality Date  .  Thyroidectomy    . Cervical disc surgery    . Insertion of dialysis catheter Right 03/19/2012    Procedure: INSERTION OF DIALYSIS CATHETER;  Surgeon: Mal Misty, MD;  Location: [place];  Service: Vascular;  Laterality: Right;  Right Internal Jugular  . Av fistula placement Left 03/25/2012    Procedure: ARTERIOVENOUS (AV) FISTULA CREATION;  Surgeon: Mal Misty, MD;  Location: [place];  Service: Vascular;  Laterality: Left;  . Ligation of competing branches of arteriovenous fistula Left 05/08/2012    Procedure: LIGATION OF COMPETING BRANCHES OF ARTERIOVENOUS FISTULA;  Surgeon: Mal Misty, MD;  Location: [place];  Service: Vascular;  Laterality: Left;  Ultrasound guided  . Cardiac catheterization      approx 30 years ago  . Amputation Right 11/10/2012    Procedure: AMPUTATION FIRST and SECOND TOES Right Foot;  Surgeon: Elam Dutch, MD;  Location: [facility] OR;  Service: Vascular;  Laterality: Right;  . Transmetatarsal amputation Left 12/16/2012    Procedure: TRANSMETATARSAL AMPUTATION AND VAC PLACEMENT;  Surgeon: Elam Dutch, MD;  Location: [place];  Service: Vascular;  Laterality: Left;  . Amputation Left 04/07/2013    Procedure: AMPUTATION DIGIT- LEFT 1ST TOE;  Surgeon: Mal Misty, MD;  Location: [facility] OR;  Service: Vascular;  Laterality: Left;  . Tee without cardioversion N/A 04/20/2013    Procedure: TRANSESOPHAGEAL ECHOCARDIOGRAM (TEE);  Surgeon: Josue Hector, MD;  Location: [facility] ENDOSCOPY;  Service: Cardiovascular;  Laterality: N/A;  . I&d extremity Left 04/22/2013    Procedure: LEXNTZGYFV CBS DEBRIDEMENT LEFT FIRST TOE AMPUTATION WOUND ;  Surgeon: Mal Misty, MD;  Location: [place];  Service: Vascular;  Laterality: Left;  . Amputation Left 04/26/2013    Procedure: Left Foot Transmetatarsal Amputation;  Surgeon: Newt Minion, MD;  Location: [place];  Service: Orthopedics;  Laterality: Left;  . Toe amputation      D/C 04-30-13  . Below knee leg amputation Left 05/14/2013    DR [name]  .  Amputation Left 05/14/2013    Procedure: AMPUTATION BELOW KNEE;  Surgeon: Newt Minion, MD;  Location: [place];  Service: Orthopedics;  Laterality: Left;  Left Below Knee Amputation  . Eye surgery Bilateral     CATARACTS  . Amputation Left 07/23/2013    Procedure: AMPUTATION BELOW KNEE;  Surgeon: Newt Minion, MD;  Location: [place];  Service: Orthopedics;  Laterality: Left;  Left Below Knee Amputation Revision  . Abdominal aortagram Bilateral 11/06/2012    Procedure: ABDOMINAL AORTAGRAM;  Surgeon: Elam Dutch, MD;  Location: [facility] CATH LAB;  Service: Cardiovascular;  Laterality: Bilateral;  . I&d extremity Right 01/05/2014    Procedure: IRRIGATION AND DEBRIDEMENT Right Heel Ulcer;  Surgeon: Mcarthur Rossetti, MD;  Location: [place];  Service: Orthopedics;  Laterality: Right;  Surgeon Available after 5PM  . Below knee leg amputation Right 02/18/2014    dr duda  . Amputation Right 02/18/2014    Procedure: AMPUTATION BELOW KNEE;  Surgeon: Newt Minion, MD;  Location: [place];  Service: Orthopedics;  Laterality: Right;  . Stump revision Right 05/20/2014    Procedure: Revision Right Below Knee Amputation;  Surgeon: Newt Minion, MD;  Location: [place];  Service: Orthopedics;  Laterality: Right;  . Repair quadriceps/hamstring muscles Left 05/20/2014    Procedure: Left Hamstring Release;  Surgeon: Newt Minion, MD;  Location: [place];  Service: Orthopedics;  Laterality: Left;    There were no vitals filed for this visit.  Visit Diagnosis:  Weakness of both legs  Decreased functional activity tolerance  Abnormality of gait  Encounter for prosthetic gait training  Unsteadiness  Balance problems  Status post bilateral below knee amputation (HCC)  Activity intolerance  Decreased ROM of lower extremity  Status post below knee amputation of left lower extremity (HCC)      Subjective Assessment - 11/22/14 1318    Subjective No new complaints. No falls or pain to report.  Pt. reports wearing  prosthesis 10 hours of the 12 hours he is awake ~ 75% of awake hours.  Pt. seen with daughter present.     Patient is accompained by: Family member   Currently in Pain? No/denies      Therex (to increase scapular retractor strength to improve posture): -Sitting scapular retraction 4 x 10 reps (performed during rest breaks between gait trails); verbal cues required for squeezing shoulder blades with each rep.             [place] [facility] PT Treatment/Exercise - 11/22/14 1319    Transfers   Sit to Stand 4: Min guard;With upper extremity assist;From chair/3-in-1;With armrests   Sit to Stand Details Verbal cues for precautions/safety;Verbal cues for technique   Sit to Stand Details (indicate cue  type and reason) Frequent verbal cues required to prevent premature posterior weight shift before sitting; verbal cues required throughout for upright posture/head up.   Stand to Sit 4: Min guard;With armrests;To chair/3-in-1;With upper extremity assist   Stand to Sit Details (indicate cue type and reason) Verbal cues for technique;Verbal cues for precautions/safety   Stand to Sit Details Verbal Cues required to avoid premature posterior wt. shift before sitting from RW.     Ambulation/Gait   Ambulation/Gait Yes   Ambulation/Gait Assistance 4: Min guard   Ambulation/Gait Assistance Details Total ambulation distance 150 ft with RW; min guard provided for safety; pt. required seated rest breaks ~ 30 ft; Pt. demo'd premature posterior wt. shift proceeding sitting x 3; verbal cues provided to correct premature posterior wt. shift during stand>sit.  Pt. reported fatigue with premature posterior wt. shifts.     Ambulation Distance (Feet) 150 Feet   Assistive device Prostheses;Rolling walker   Gait Pattern Step-through pattern;Decreased step length - right;Decreased step length - left;Decreased stride length;Right flexed knee in stance;Left flexed knee in stance;Decreased trunk rotation;Trunk flexed;Narrow base of  support;Decreased hip/knee flexion - left;Decreased hip/knee flexion - right   Ambulation Surface Level;Indoor   Curb Not tested (comment)   Prosthetics   Current prosthetic wear tolerance (days/week)  7 days /wk   Current prosthetic wear tolerance (#hours/day)  increase   Residual limb condition  intact with no issues   Education Provided Skin check;Residual limb care;Prosthetic cleaning;Correct ply sock adjustment;Proper Donning;Proper Doffing;Proper weight-bearing schedule/adjustment   Person(s) Educated Patient   Education Method Explanation;Demonstration;Verbal cues;Tactile cues   Education Method Verbalized understanding;Returned demonstration   Donning Prosthesis Supervision   Doffing Prosthesis Modified independent (device/increased time)         PT Short Term Goals - 11/23/14 1440    PT SHORT TERM GOAL #1   Title Patient donnes prostheses with cues only. (Target Date: 09/16/2014)   Baseline MET 09/22/2014   Status Achieved   PT SHORT TERM GOAL #2   Title Patient tolerates bilateral prostheses wear >6hrs total per day without skin issues or limb pain.  (Target Date: 09/16/2014)   Baseline MET 09/22/2014   Status Achieved   PT SHORT TERM GOAL #3   Title Squat-pivot transfer w/c to level mat modified independent with buttock clearance.  (Target Date: 09/16/2014)   Baseline 09/22/2014 MET with general cues to lift buttocks more   Status Achieved   PT SHORT TERM GOAL #4   Title Sit to / from stand w/c to parallel bars with moderate assist  (Target Date: 09/16/2014)   Baseline MET 09/13/2014   Status Achieved   PT SHORT TERM GOAL #5   Title Patient ambulates 10' in parallel bars with prostheses with max assist of 1 person with 2nd person supervising for safety.  (Target Date: 09/16/2014)   Baseline MET 09/13/2014   Status Achieved   PT SHORT TERM GOAL #6   Title Patient tolerates wear of prostheses >90% of awake hours, at dialysis may remove prostheses for comfort, with PT guidance cues.  (Target Date: 10/14/2014) NEW Target Date 11/17/2014   Baseline 11/15/14: Pt. tolerates wear of prosthesis 9 hrs of 12 hr awake daily.     Status Not Met   PT SHORT TERM GOAL #7   Title Patient performs stand-pivot transfers w/c to chair with armrests with RW & prostheses with minimal assist. (Target Date: 10/14/2014)   Baseline 10/11/14: met today   Status Achieved   PT SHORT TERM GOAL #8  Title Patient ambulates 55' with RW & prostheses including turning to sit in chair with armrests with MinA. (Target Date: 10/14/2014)   Baseline 10/13/2014 MET    Status Achieved   PT SHORT TERM GOAL #9   TITLE Patient ambulates 78' with family assistance with RW & prosthesis with PT supervising / cueing for safety. (NEW Target Date 11/17/2014)   Baseline 11/15/14: per patient/daughter walking 15-20' at home.    Status Partially Met   PT SHORT TERM GOAL #10   TITLE Patient able to maintain upright wtih RW support for 2 minutes & reach 3" anteriorly and to knee level with contact assist. (NEW Target Date 11/17/2014)   Baseline on 11/15/14   Status Achieved          PT Long Term Goals - 11/23/14 1440    PT LONG TERM GOAL #1   Title Tolerates wear of prostheses >90% of awake hours without change in skin integrity nor undue tenderness.  (Target Date: 12/16/2014)   Status Not Met   PT LONG TERM GOAL #2   Title Patient demonstrates / verbalizes proper prosthetic care for safe use of prostheses without issues.  (Target Date: 12/16/2014)   Status On-going   PT LONG TERM GOAL #3   Title Sit to /from stand and stand-pivot transfers with RW & prostheses modified independent.  (Target Date: 12/16/2014)   Status On-going   PT LONG TERM GOAL #4   Title Standing balance with RW & prostheses manages clothes & reaches 5" with supervision.  (Target Date: 12/16/2014)   Baseline met on 10/18/14   Status Achieved   PT LONG TERM GOAL #5   Title Patient ambulates 6' with RW & prostheses with minimal assist from family safely.   (Target Date: 12/16/2014)   Status On-going            Plan - 11/22/14 1333    Clinical Impression Statement Pt. requires min guard for safety during gait trails with RW; with frequent verbal cues to prevent posterior weight shift before sitting rest breaks are taken.  Suggest further instruction with stand>sit with reinforcement of safety precautions emphasized.  Pt. activity tolerence limited by dialysis resulting in  decreased energy levels.  Pt. instructed to eat before therapy session on 11/10 if possible as that may help to increase his energy levels.     Rehab Potential Good   Clinical Impairments Affecting Rehab Potential Dialysis limits strength and endurance with slow gains.    PT Frequency 2x / week   PT Duration Other (comment)  17 weeks (4 months - 120 days)   PT Treatment/Interventions ADLs/Self Care Home Management;DME Instruction;Gait training;Functional mobility training;Therapeutic activities;Therapeutic exercise;Balance training;Neuromuscular re-education;Patient/family education;Prosthetic Training;Passive range of motion   PT Next Visit Plan Continue gait training, postural re-education;barriers with walker/prostheses.  Reinforcement of RW spacing,hand placement, and proper weight shift during transfers.  Reinforce importance of proper wt. shift during stand>sit.     Consulted and Agree with Plan of Care Patient;Family member/caregiver   Family Member Consulted dtr        Problem List Patient Active Problem List   Diagnosis Date Noted  . Complications, amputation stump late ([place]) 05/20/2014  . Weakness generalized 04/07/2014  . Sepsis ([place]) 04/07/2014  . Decubitus ulcer, stage II 04/07/2014  . Decubitus ulcer of ankle   . Fatigue   . Wound infection ([place]) 04/06/2014  . Decubitus ulcer of sacral region, stage 2 03/07/2014  . Glaucoma 03/07/2014  . Gout 03/07/2014  . Below knee  amputation status ([place]) 02/18/2014  . Osteomyelitis ([place]) 01/04/2014  . Phantom  limb ([place]) 12/14/2013  . Type 1 diabetes mellitus with diabetic foot infection ([place])   . Diabetes mellitus with peripheral vascular disease ([place])   . Asthma 05/17/2013  . History of MRSA infection 04/22/2013  . Peripheral vascular disease ([place]) 11/19/2012  . End-stage renal disease on hemodialysis ([place]) 05/05/2012  . Kahler disease ([place]) 03/30/2012  . Anemia in chronic kidney disease 03/19/2012  . Hypothyroidism 03/19/2012  . Anemia, iron deficiency 03/19/2012  . Hypertension 03/18/2012  . Chronic kidney disease (CKD), stage V ([place]) 03/18/2012  . Cardiac conduction disorder 03/18/2012  . MGUS (monoclonal gammopathy of unknown significance) 02/28/2011  . Monoclonal paraproteinemia 02/28/2011    Bess Harvest 11/22/2014, 1:41 PM  [place], [place]  Name: Jack [name] MRN: [id number] Date of Birth: 01-09-1946  This note has been reviewed and edited by supervising CI.  Willow Ora, PTA, [place] [address] [place], [place] [address] [phone] 11/23/2014, 2:43 PM

## 2014-11-22 NOTE — Patient Instructions (Signed)
Pt. Instructed on safety precautions with stand > sit technique; visual check of correct spacing from chair or surface being transferred to.  Pt. Instructed thoroughly x 3 during stand>sit to avoid premature posterior wt. Shift; pt. Struggled to demo proper wt. Shift secondary to reported fatigue at duration of each gait trail with premature sitting.

## 2014-11-24 ENCOUNTER — Ambulatory Visit: Payer: Medicare Other | Admitting: Physical Therapy

## 2014-11-24 ENCOUNTER — Encounter: Payer: Self-pay | Admitting: Physical Therapy

## 2014-11-24 DIAGNOSIS — R29898 Other symptoms and signs involving the musculoskeletal system: Secondary | ICD-10-CM | POA: Diagnosis not present

## 2014-11-24 DIAGNOSIS — Z4789 Encounter for other orthopedic aftercare: Secondary | ICD-10-CM

## 2014-11-24 DIAGNOSIS — R2681 Unsteadiness on feet: Secondary | ICD-10-CM

## 2014-11-24 DIAGNOSIS — Z89511 Acquired absence of right leg below knee: Secondary | ICD-10-CM

## 2014-11-24 DIAGNOSIS — M25669 Stiffness of unspecified knee, not elsewhere classified: Secondary | ICD-10-CM

## 2014-11-24 DIAGNOSIS — R2689 Other abnormalities of gait and mobility: Secondary | ICD-10-CM

## 2014-11-24 DIAGNOSIS — R6889 Other general symptoms and signs: Secondary | ICD-10-CM

## 2014-11-24 DIAGNOSIS — Z89512 Acquired absence of left leg below knee: Secondary | ICD-10-CM

## 2014-11-24 DIAGNOSIS — R269 Unspecified abnormalities of gait and mobility: Secondary | ICD-10-CM

## 2014-11-24 NOTE — Patient Instructions (Signed)
Pt. Instructed on importance of walking out feet away from chair before stand > sit to prevent overflexion of knees.  Fall prevention precautions reinforced with stand > sit to prevent premature posterior wt. Shift.

## 2014-11-24 NOTE — Therapy (Signed)
[facility] [address] [place], [place], [address] Phone: [phone]   Fax:  [phone]  Physical Therapy Treatment  Patient Details  Name: Jack [name] MRN: [id number] Date of Birth: Mar 09, 1945 Referring Provider: Meridee Score, MD  Encounter Date: 11/24/2014      PT End of Session - 11/24/14 0921    Visit Number 24   Number of Visits 35   Date for PT Re-Evaluation 12/17/14   Authorization Type G-Code every 10 visits   PT Start Time 0808   PT Stop Time 0848   PT Time Calculation (min) 40 min   Equipment Utilized During Treatment Gait belt   Activity Tolerance Patient tolerated treatment well   Behavior During Therapy [facility] for tasks assessed/performed      Past Medical History  Diagnosis Date  . Hypertension   . Pneumonia     2012  . Heart murmur   . Glaucoma   . Multiple myeloma, without mention of having achieved remission 03/30/2012    Cytogenetic neg on 03/23/2012.  . Asthma   . Hyperparathyroidism, secondary renal ([place])   . Peripheral arterial disease ([place])   . End-stage renal disease on hemodialysis [facility])     Started HD March 2014.  Cause of ESRD was DM.  Gets HD at [facility] on [address] on MWF schedule.   . Thyroid disease     hyperparathyroidism  . MRSA bacteremia   . Anemia   . Peripheral vascular disease, unspecified ([place]) 11/19/2012    In the past had R foot toe amps then R TMA. In 2015 had left foot toe amp > then TMA >then L BKA on 05/14/13   . History of MRSA infection 04/22/2013    Bacteremia assoc w L foot wound infection Mar 2015   . Gangrene of foot ([place])   . ESRD on hemodialysis ([place])   . Diabetes mellitus with peripheral vascular disease ([place])   . Hypothyroidism   . Decubitus ulcer of sacral region, stage 2 03/07/2014  . Glaucoma 03/07/2014  . Gangrene ([place])     right BKA  . Contracture of joint     left knee    Past Surgical History  Procedure Laterality Date  .  Thyroidectomy    . Cervical disc surgery    . Insertion of dialysis catheter Right 03/19/2012    Procedure: INSERTION OF DIALYSIS CATHETER;  Surgeon: Mal Misty, MD;  Location: [place];  Service: Vascular;  Laterality: Right;  Right Internal Jugular  . Av fistula placement Left 03/25/2012    Procedure: ARTERIOVENOUS (AV) FISTULA CREATION;  Surgeon: Mal Misty, MD;  Location: [place];  Service: Vascular;  Laterality: Left;  . Ligation of competing branches of arteriovenous fistula Left 05/08/2012    Procedure: LIGATION OF COMPETING BRANCHES OF ARTERIOVENOUS FISTULA;  Surgeon: Mal Misty, MD;  Location: [place];  Service: Vascular;  Laterality: Left;  Ultrasound guided  . Cardiac catheterization      approx 30 years ago  . Amputation Right 11/10/2012    Procedure: AMPUTATION FIRST and SECOND TOES Right Foot;  Surgeon: Elam Dutch, MD;  Location: [facility] OR;  Service: Vascular;  Laterality: Right;  . Transmetatarsal amputation Left 12/16/2012    Procedure: TRANSMETATARSAL AMPUTATION AND VAC PLACEMENT;  Surgeon: Elam Dutch, MD;  Location: [place];  Service: Vascular;  Laterality: Left;  . Amputation Left 04/07/2013    Procedure: AMPUTATION DIGIT- LEFT 1ST TOE;  Surgeon: Mal Misty, MD;  Location: [facility] OR;  Service: Vascular;  Laterality: Left;  . Tee without cardioversion N/A 04/20/2013    Procedure: TRANSESOPHAGEAL ECHOCARDIOGRAM (TEE);  Surgeon: Josue Hector, MD;  Location[facility] ENDOSCOPY;  Service: Cardiovascular;  Laterality: N/A;  . I&d extremity Left 04/22/2013    Procedure: OINOMVEHMC NOB DEBRIDEMENT LEFT FIRST TOE AMPUTATION WOUND ;  Surgeon: Mal Misty, MD;  Location: [place];  Service: Vascular;  Laterality: Left;  . Amputation Left 04/26/2013    Procedure: Left Foot Transmetatarsal Amputation;  Surgeon: Newt Minion, MD;  Location: [place];  Service: Orthopedics;  Laterality: Left;  . Toe amputation      D/C 04-30-13  . Below knee leg amputation Left 05/14/2013    DR [name]  .  Amputation Left 05/14/2013    Procedure: AMPUTATION BELOW KNEE;  Surgeon: Newt Minion, MD;  Location: [place];  Service: Orthopedics;  Laterality: Left;  Left Below Knee Amputation  . Eye surgery Bilateral     CATARACTS  . Amputation Left 07/23/2013    Procedure: AMPUTATION BELOW KNEE;  Surgeon: Newt Minion, MD;  Location: [place];  Service: Orthopedics;  Laterality: Left;  Left Below Knee Amputation Revision  . Abdominal aortagram Bilateral 11/06/2012    Procedure: ABDOMINAL AORTAGRAM;  Surgeon: Elam Dutch, MD;  Location: [facility] CATH LAB;  Service: Cardiovascular;  Laterality: Bilateral;  . I&d extremity Right 01/05/2014    Procedure: IRRIGATION AND DEBRIDEMENT Right Heel Ulcer;  Surgeon: Mcarthur Rossetti, MD;  Location: [place];  Service: Orthopedics;  Laterality: Right;  Surgeon Available after 5PM  . Below knee leg amputation Right 02/18/2014    dr duda  . Amputation Right 02/18/2014    Procedure: AMPUTATION BELOW KNEE;  Surgeon: Newt Minion, MD;  Location: [place];  Service: Orthopedics;  Laterality: Right;  . Stump revision Right 05/20/2014    Procedure: Revision Right Below Knee Amputation;  Surgeon: Newt Minion, MD;  Location: [place];  Service: Orthopedics;  Laterality: Right;  . Repair quadriceps/hamstring muscles Left 05/20/2014    Procedure: Left Hamstring Release;  Surgeon: Newt Minion, MD;  Location: [place];  Service: Orthopedics;  Laterality: Left;    There were no vitals filed for this visit.  Visit Diagnosis:  Weakness of both legs  Decreased functional activity tolerance  Abnormality of gait  Encounter for prosthetic gait training  Unsteadiness  Balance problems  Status post bilateral below knee amputation (HCC)  Activity intolerance  Decreased ROM of lower extremity  Status post below knee amputation of left lower extremity (HCC)      Subjective Assessment - 11/24/14 0904    Subjective No new complaints. No [name] or pain to report.  Pt. seen with daughter  present.  Pt. states, "I ate some breakfast this time before coming her to work.".      Scapular strengthening (to improve posture):  -Scapular retraction with blue TB 7 sets of 15 reps; taken during each sitting rest break during gait training; verbal cues provided for technique        [facility] Adult PT Treatment/Exercise - 11/24/14 1107    Transfers   Transfers Sit to Stand;Stand to Sit   Sit to Stand 4: Min guard;With upper extremity assist;From chair/3-in-1;With armrests   Sit to Stand Details Verbal cues for precautions/safety;Verbal cues for technique   Sit to Stand Details (indicate cue type and reason) pt. improved sit>stand pacing today performing in less time;no verbal cues required; PTA stabilized WC due to poor  brake control; no other physical assistance provided    Stand to Sit 4: Min guard;With armrests;To chair/3-in-1;With upper extremity assist   Stand to Sit Details (indicate cue type and reason) Verbal cues for technique;Verbal cues for precautions/safety   Stand to Sit Details Pt. performed sit>stand 6 times with only one instance of premature posterior wt. shift; danger of premature posterior wt. shift reinforced during session x 6; min guard provided for chair stability.     Number of Reps 10 reps   Transfer Cueing stand > sit from RW to chair min guard provided for chair stability; verbal cues provided x 6 for pt. to walk feet away from chair during stand > sit; pt. reported pain in bilateral residual limbs when he failed to space feet away from chair to prevent over flexion of knees.     Ambulation/Gait   Ambulation/Gait Yes   Ambulation/Gait Assistance 5: Supervision   Ambulation/Gait Assistance Details Total ambulation distance of 148f with RW; supervision provided for upright posture throughout; gait trials performed [id number]; 74f breaks taken in sitting with scapular retraction with blue TB performed x 15 reps each break; scapular retraction  performed for posture correction; verbal cues provided to correct forward flexed posture; pt. improved on sit>stand technique only demonstrating premature posterior wt. shift x 1/6 stand >sit.    Ambulation Distance (Feet) 152 Feet   Assistive device Prostheses;Rolling walker   Gait Pattern Step-through pattern;Decreased step length - right;Decreased step length - left;Decreased stride length;Right flexed knee in stance;Left flexed knee in stance;Decreased trunk rotation;Trunk flexed;Narrow base of support;Decreased hip/knee flexion - left;Decreased hip/knee flexion - right   Ambulation Surface Level;Indoor   Ramp --   Curb --   Curb Details (indicate cue type and reason) --            PT Short Term Goals - 11/23/14 1440    PT SHORT TERM GOAL #1   Title Patient donnes prostheses with cues only. (Target Date: 09/16/2014)   Baseline MET 09/22/2014   Status Achieved   PT SHORT TERM GOAL #2   Title Patient tolerates bilateral prostheses wear >6hrs total per day without skin issues or limb pain.  (Target Date: 09/16/2014)   Baseline MET 09/22/2014   Status Achieved   PT SHORT TERM GOAL #3   Title Squat-pivot transfer w/c to level mat modified independent with buttock clearance.  (Target Date: 09/16/2014)   Baseline 09/22/2014 MET with general cues to lift buttocks more   Status Achieved   PT SHORT TERM GOAL #4   Title Sit to / from stand w/c to parallel bars with moderate assist  (Target Date: 09/16/2014)   Baseline MET 09/13/2014   Status Achieved   PT SHORT TERM GOAL #5   Title Patient ambulates 10' in parallel bars with prostheses with max assist of 1 person with 2nd person supervising for safety.  (Target Date: 09/16/2014)   Baseline MET 09/13/2014   Status Achieved   PT SHORT TERM GOAL #6   Title Patient tolerates wear of prostheses >90% of awake hours, at dialysis may remove prostheses for comfort, with PT guidance cues. (Target Date: 10/14/2014) NEW Target Date 11/17/2014   Baseline 11/15/14: Pt.  tolerates wear of prosthesis 9 hrs of 12 hr awake daily.     Status Not Met   PT SHORT TERM GOAL #7   Title Patient performs stand-pivot transfers w/c to chair with armrests with RW & prostheses with minimal assist. (Target Date: 10/14/2014)  Baseline 10/11/14: met today   Status Achieved   PT SHORT TERM GOAL #8   Title Patient ambulates 25' with RW & prostheses including turning to sit in chair with armrests with MinA. (Target Date: 10/14/2014)   Baseline 10/13/2014 MET    Status Achieved   PT SHORT TERM GOAL #9   TITLE Patient ambulates 17' with family assistance with RW & prosthesis with PT supervising / cueing for safety. (NEW Target Date 11/17/2014)   Baseline 11/15/14: per patient/daughter walking 15-20' at home.    Status Partially Met   PT SHORT TERM GOAL #10   TITLE Patient able to maintain upright wtih RW support for 2 minutes & reach 3" anteriorly and to knee level with contact assist. (NEW Target Date 11/17/2014)   Baseline on 11/15/14   Status Achieved           PT Long Term Goals - 11/23/14 1440    PT LONG TERM GOAL #1   Title Tolerates wear of prostheses >90% of awake hours without change in skin integrity nor undue tenderness.  (Target Date: 12/16/2014)   Status Not Met   PT LONG TERM GOAL #2   Title Patient demonstrates / verbalizes proper prosthetic care for safe use of prostheses without issues.  (Target Date: 12/16/2014)   Status On-going   PT LONG TERM GOAL #3   Title Sit to /from stand and stand-pivot transfers with RW & prostheses modified independent.  (Target Date: 12/16/2014)   Status On-going   PT LONG TERM GOAL #4   Title Standing balance with RW & prostheses manages clothes & reaches 5" with supervision.  (Target Date: 12/16/2014)   Baseline met on 10/18/14   Status Achieved   PT LONG TERM GOAL #5   Title Patient ambulates 51' with RW & prostheses with minimal assist from family safely.  (Target Date: 12/16/2014)   Status On-going            Plan -  11/24/14 8850    Clinical Impression Statement Pt. improved on gait distance with RW and stand > sit technique; decreased premature posterior wt. shift with stand>sit; verbal cues continue to be required for feet spacing from chair with stand>sit to prevent over flexion of knees.  Pt. energy level better today with addition of breakfast.  Pt. continues to progress toward STG # 9.          Rehab Potential Good   Clinical Impairments Affecting Rehab Potential Dialysis limits strength and endurance with slow gains.    PT Frequency 2x / week   PT Duration Other (comment)  17 weeks (4 months - 120 days)   PT Treatment/Interventions ADLs/Self Care Home Management;DME Instruction;Gait training;Functional mobility training;Therapeutic activities;Therapeutic exercise;Balance training;Neuromuscular re-education;Patient/family education;Prosthetic Training;Passive range of motion   PT Next Visit Plan Continue gait training, postural re-education;barriers with walker/prostheses.  Reinforcement of RW spacing,hand placement, and proper weight shift during transfers.  Reinforce importance of proper wt. shift during stand>sit.     Consulted and Agree with Plan of Care Patient;Family member/caregiver   Family Member Consulted dtr        Problem List Patient Active Problem List   Diagnosis Date Noted  . Complications, amputation stump late ([place]) 05/20/2014  . Weakness generalized 04/07/2014  . Sepsis ([place]) 04/07/2014  . Decubitus ulcer, stage II 04/07/2014  . Decubitus ulcer of ankle   . Fatigue   . Wound infection ([place]) 04/06/2014  . Decubitus ulcer of sacral region, stage 2 03/07/2014  . Glaucoma 03/07/2014  .  Gout 03/07/2014  . Below knee amputation status ([name]) 02/18/2014  . Osteomyelitis ([place]) 01/04/2014  . Phantom limb ([place]) 12/14/2013  . Type 1 diabetes mellitus with diabetic foot infection ([place])   . Diabetes mellitus with peripheral vascular disease ([place])   . Asthma 05/17/2013  . History of  MRSA infection 04/22/2013  . Peripheral vascular disease ([place]) 11/19/2012  . End-stage renal disease on hemodialysis ([place]) 05/05/2012  . Kahler disease ([place]) 03/30/2012  . Anemia in chronic kidney disease 03/19/2012  . Hypothyroidism 03/19/2012  . Anemia, iron deficiency 03/19/2012  . Hypertension 03/18/2012  . Chronic kidney disease (CKD), stage V ([place]) 03/18/2012  . Cardiac conduction disorder 03/18/2012  . MGUS (monoclonal gammopathy of unknown significance) 02/28/2011  . Monoclonal paraproteinemia 02/28/2011    Bess Harvest 11/24/2014, 3:42 PM  Bess Harvest, [place]  Name: Jack [name] MRN: [id number] Date of Birth: Aug 09, 1945  This note has been reviewed and edited by supervising CI.  Willow Ora, PTA, [place] [address], [place], [place] [address] [phone] 11/25/2014, 10:07 AM

## 2014-11-29 ENCOUNTER — Ambulatory Visit: Payer: Medicare Other | Admitting: Physical Therapy

## 2014-11-29 ENCOUNTER — Encounter: Payer: Self-pay | Admitting: Physical Therapy

## 2014-11-29 DIAGNOSIS — R29898 Other symptoms and signs involving the musculoskeletal system: Secondary | ICD-10-CM

## 2014-11-29 DIAGNOSIS — R6889 Other general symptoms and signs: Secondary | ICD-10-CM

## 2014-11-29 DIAGNOSIS — Z89511 Acquired absence of right leg below knee: Secondary | ICD-10-CM

## 2014-11-29 DIAGNOSIS — Z4789 Encounter for other orthopedic aftercare: Secondary | ICD-10-CM

## 2014-11-29 DIAGNOSIS — R269 Unspecified abnormalities of gait and mobility: Secondary | ICD-10-CM

## 2014-11-29 DIAGNOSIS — R2681 Unsteadiness on feet: Secondary | ICD-10-CM

## 2014-11-29 DIAGNOSIS — R2689 Other abnormalities of gait and mobility: Secondary | ICD-10-CM

## 2014-11-29 DIAGNOSIS — Z89512 Acquired absence of left leg below knee: Secondary | ICD-10-CM

## 2014-11-29 NOTE — Therapy (Signed)
[facility] [address] [place], [place], [address] Phone: [phone]   Fax:  [phone]  Physical Therapy Treatment  Patient Details  Name: Jack [name] MRN: [id number] Date of Birth: 1945-04-10 Referring Provider: Meridee Score, MD  Encounter Date: 11/29/2014      PT End of Session - 11/29/14 0845    Visit Number 25   Number of Visits 35   Date for PT Re-Evaluation 12/17/14   Authorization Type G-Code every 10 visits   PT Start Time 0800   PT Stop Time 0845   PT Time Calculation (min) 45 min   Equipment Utilized During Treatment Gait belt   Activity Tolerance Patient tolerated treatment well   Behavior During Therapy [facility] for tasks assessed/performed      Past Medical History  Diagnosis Date  . Hypertension   . Pneumonia     2012  . Heart murmur   . Glaucoma   . Multiple myeloma, without mention of having achieved remission 03/30/2012    Cytogenetic neg on 03/23/2012.  . Asthma   . Hyperparathyroidism, secondary renal ([place])   . Peripheral arterial disease ([place])   . End-stage renal disease on hemodialysis [facility])     Started HD March 2014.  Cause of ESRD was DM.  Gets HD at [facility] on [address] on MWF schedule.   . Thyroid disease     hyperparathyroidism  . MRSA bacteremia   . Anemia   . Peripheral vascular disease, unspecified ([place]) 11/19/2012    In the past had R foot toe amps then R TMA. In 2015 had left foot toe amp > then TMA >then L BKA on 05/14/13   . History of MRSA infection 04/22/2013    Bacteremia assoc w L foot wound infection Mar 2015   . Gangrene of foot ([place])   . ESRD on hemodialysis ([place])   . Diabetes mellitus with peripheral vascular disease ([place])   . Hypothyroidism   . Decubitus ulcer of sacral region, stage 2 03/07/2014  . Glaucoma 03/07/2014  . Gangrene ([place])     right BKA  . Contracture of joint     left knee    Past Surgical History  Procedure Laterality Date  .  Thyroidectomy    . Cervical disc surgery    . Insertion of dialysis catheter Right 03/19/2012    Procedure: INSERTION OF DIALYSIS CATHETER;  Surgeon: Mal Misty, MD;  Location: [place];  Service: Vascular;  Laterality: Right;  Right Internal Jugular  . Av fistula placement Left 03/25/2012    Procedure: ARTERIOVENOUS (AV) FISTULA CREATION;  Surgeon: Mal Misty, MD;  Location: [place];  Service: Vascular;  Laterality: Left;  . Ligation of competing branches of arteriovenous fistula Left 05/08/2012    Procedure: LIGATION OF COMPETING BRANCHES OF ARTERIOVENOUS FISTULA;  Surgeon: Mal Misty, MD;  Location: [place];  Service: Vascular;  Laterality: Left;  Ultrasound guided  . Cardiac catheterization      approx 30 years ago  . Amputation Right 11/10/2012    Procedure: AMPUTATION FIRST and SECOND TOES Right Foot;  Surgeon: Elam Dutch, MD;  Location: [facility] OR;  Service: Vascular;  Laterality: Right;  . Transmetatarsal amputation Left 12/16/2012    Procedure: TRANSMETATARSAL AMPUTATION AND VAC PLACEMENT;  Surgeon: Elam Dutch, MD;  Location: [place];  Service: Vascular;  Laterality: Left;  . Amputation Left 04/07/2013    Procedure: AMPUTATION DIGIT- LEFT 1ST TOE;  Surgeon: Mal Misty, MD;  Location: [facility] OR;  Service: Vascular;  Laterality: Left;  . Tee without cardioversion N/A 04/20/2013    Procedure: TRANSESOPHAGEAL ECHOCARDIOGRAM (TEE);  Surgeon: Josue Hector, MD;  Location: [facility] ENDOSCOPY;  Service: Cardiovascular;  Laterality: N/A;  . I&d extremity Left 04/22/2013    Procedure: DJSHFWYOVZ CHY DEBRIDEMENT LEFT FIRST TOE AMPUTATION WOUND ;  Surgeon: Mal Misty, MD;  Location: [place];  Service: Vascular;  Laterality: Left;  . Amputation Left 04/26/2013    Procedure: Left Foot Transmetatarsal Amputation;  Surgeon: Newt Minion, MD;  Location: [place];  Service: Orthopedics;  Laterality: Left;  . Toe amputation      D/C 04-30-13  . Below knee leg amputation Left 05/14/2013    DR [name]  .  Amputation Left 05/14/2013    Procedure: AMPUTATION BELOW KNEE;  Surgeon: Newt Minion, MD;  Location: [facility];  Service: Orthopedics;  Laterality: Left;  Left Below Knee Amputation  . Eye surgery Bilateral     CATARACTS  . Amputation Left 07/23/2013    Procedure: AMPUTATION BELOW KNEE;  Surgeon: Newt Minion, MD;  Location: [place];  Service: Orthopedics;  Laterality: Left;  Left Below Knee Amputation Revision  . Abdominal aortagram Bilateral 11/06/2012    Procedure: ABDOMINAL AORTAGRAM;  Surgeon: Elam Dutch, MD;  Location: [facility] CATH LAB;  Service: Cardiovascular;  Laterality: Bilateral;  . I&d extremity Right 01/05/2014    Procedure: IRRIGATION AND DEBRIDEMENT Right Heel Ulcer;  Surgeon: Mcarthur Rossetti, MD;  Location: [place];  Service: Orthopedics;  Laterality: Right;  Surgeon Available after 5PM  . Below knee leg amputation Right 02/18/2014    dr duda  . Amputation Right 02/18/2014    Procedure: AMPUTATION BELOW KNEE;  Surgeon: Newt Minion, MD;  Location: [place];  Service: Orthopedics;  Laterality: Right;  . Stump revision Right 05/20/2014    Procedure: Revision Right Below Knee Amputation;  Surgeon: Newt Minion, MD;  Location: [place];  Service: Orthopedics;  Laterality: Right;  . Repair quadriceps/hamstring muscles Left 05/20/2014    Procedure: Left Hamstring Release;  Surgeon: Newt Minion, MD;  Location: [place];  Service: Orthopedics;  Laterality: Left;    There were no vitals filed for this visit.  Visit Diagnosis:  Weakness of both legs  Decreased functional activity tolerance  Abnormality of gait  Encounter for prosthetic gait training  Unsteadiness  Balance problems  Status post bilateral below knee amputation (HCC)      Subjective Assessment - 11/29/14 0803    Subjective No complaints. He is wearing prostheses all awake hours except to dialysis.    Currently in Pain? No/denies                         [facility] Adult PT Treatment/Exercise - 11/29/14  0800    Transfers   Transfers Sit to Stand;Stand to Sit   Sit to Stand 5: Supervision;With upper extremity assist;With armrests;From chair/3-in-1  to RW   Sit to Stand Details Verbal cues for precautions/safety;Verbal cues for technique   Stand to Sit 5: Supervision;With upper extremity assist;With armrests;To chair/3-in-1  from RW   Stand to Sit Details (indicate cue type and reason) Verbal cues for technique;Verbal cues for precautions/safety   Ambulation/Gait   Ambulation/Gait Yes   Ambulation/Gait Assistance 5: Supervision   Ambulation/Gait Assistance Details verbal cues on posture and step length   Ambulation Distance (Feet) 60 Feet   Assistive device Prostheses;Rolling walker   Gait Pattern  Step-through pattern;Decreased step length - right;Decreased step length - left;Decreased stride length;Right flexed knee in stance;Left flexed knee in stance;Decreased trunk rotation;Trunk flexed;Narrow base of support;Decreased hip/knee flexion - left;Decreased hip/knee flexion - right   Ambulation Surface Indoor;Level   Stairs Yes   Stairs Assistance 3: Mod assist  2 people for safety   Stairs Assistance Details (indicate cue type and reason) PT demo, instructed technique prior and manual, verbal cues during  Pt required seated rest at top of stairs   Stair Management Technique Two rails;Step to pattern;Forwards  switched lead limb   Number of Stairs 4   Ramp 2: Max assist  RW & prostheses, 2 people for safety   Ramp Details (indicate cue type and reason) PT demo, instructed technique prior and manual, verbal cues during  seated rest at top of ramp   Curb 3: Mod assist  RW & prostheses, 2 people for safety   Curb Details (indicate cue type and reason) PT demo, instructed technique prior and manual, verbal cues during   Prosthetics   Prosthetic Care Comments  pt verbalizes how to appropriately weigh & adjust weigh-in /weigh-out for dialysis if he wants to wear prosthesis. He reports he  does not trust the techs at dialysis and they already have problems with his BP.   Current prosthetic wear tolerance (days/week)  7 days /wk   Current prosthetic wear tolerance (#hours/day)  reports wearing all awake hours when out of bed except dialysis or bathing.    Education Provided Correct ply sock adjustment   Person(s) Educated Patient;Child(ren)   Education Method Explanation   Education Method Verbalized understanding                  PT Short Term Goals - 11/23/14 1440    PT SHORT TERM GOAL #1   Title Patient donnes prostheses with cues only. (Target Date: 09/16/2014)   Baseline MET 09/22/2014   Status Achieved   PT SHORT TERM GOAL #2   Title Patient tolerates bilateral prostheses wear >6hrs total per day without skin issues or limb pain.  (Target Date: 09/16/2014)   Baseline MET 09/22/2014   Status Achieved   PT SHORT TERM GOAL #3   Title Squat-pivot transfer w/c to level mat modified independent with buttock clearance.  (Target Date: 09/16/2014)   Baseline 09/22/2014 MET with general cues to lift buttocks more   Status Achieved   PT SHORT TERM GOAL #4   Title Sit to / from stand w/c to parallel bars with moderate assist  (Target Date: 09/16/2014)   Baseline MET 09/13/2014   Status Achieved   PT SHORT TERM GOAL #5   Title Patient ambulates 10' in parallel bars with prostheses with max assist of 1 person with 2nd person supervising for safety.  (Target Date: 09/16/2014)   Baseline MET 09/13/2014   Status Achieved   PT SHORT TERM GOAL #6   Title Patient tolerates wear of prostheses >90% of awake hours, at dialysis may remove prostheses for comfort, with PT guidance cues. (Target Date: 10/14/2014) NEW Target Date 11/17/2014   Baseline 11/15/14: Pt. tolerates wear of prosthesis 9 hrs of 12 hr awake daily.     Status Not Met   PT SHORT TERM GOAL #7   Title Patient performs stand-pivot transfers w/c to chair with armrests with RW & prostheses with minimal assist. (Target Date:  10/14/2014)   Baseline 10/11/14: met today   Status Achieved   PT SHORT TERM GOAL #8   Title Patient  ambulates 25' with RW & prostheses including turning to sit in chair with armrests with MinA. (Target Date: 10/14/2014)   Baseline 10/13/2014 MET    Status Achieved   PT SHORT TERM GOAL #9   TITLE Patient ambulates 90' with family assistance with RW & prosthesis with PT supervising / cueing for safety. (NEW Target Date 11/17/2014)   Baseline 11/15/14: per patient/daughter walking 15-20' at home.    Status Partially Met   PT SHORT TERM GOAL #10   TITLE Patient able to maintain upright wtih RW support for 2 minutes & reach 3" anteriorly and to knee level with contact assist. (NEW Target Date 11/17/2014)   Baseline on 11/15/14   Status Achieved           PT Long Term Goals - 11/29/14 0805    PT LONG TERM GOAL #1   Title Tolerates wear of prostheses >90% of awake hours (except dialysis) without change in skin integrity nor undue tenderness.  (Target Date: 12/16/2014)   Status On-going   PT LONG TERM GOAL #2   Title Patient demonstrates / verbalizes proper prosthetic care for safe use of prostheses without issues.  (Target Date: 12/16/2014)   Status On-going   PT LONG TERM GOAL #3   Title Sit to /from stand and stand-pivot transfers with RW & prostheses modified independent.  (Target Date: 12/16/2014)   Status On-going   PT LONG TERM GOAL #4   Title Standing balance with RW & prostheses manages clothes & reaches 5" with supervision.  (Target Date: 12/16/2014)   Baseline met on 10/18/14   Status Achieved   PT LONG TERM GOAL #5   Title Patient ambulates 100' with RW & prostheses with minimal assist from family safely.  (Target Date: 12/16/2014)   Status Revised   Additional Long Term Goals   Additional Long Term Goals Yes   PT LONG TERM GOAL #6   Title Patient negotiates ramps & curbs with modA with RW & prostheses. (Target Date: 12/16/2014)   Status New               Plan - 11/29/14  0845    Clinical Impression Statement Patient did well for first attempt at stairs, ramps & curbs with PT instruction and assistance. PT had 2nd person assist for safety but patient performed 50% of tasks. Patient reports he was worn out after session today.    Pt will benefit from skilled therapeutic intervention in order to improve on the following deficits Abnormal gait;Decreased activity tolerance;Decreased balance;Decreased endurance;Decreased knowledge of precautions;Decreased knowledge of use of DME;Decreased mobility;Decreased range of motion;Decreased strength;Postural dysfunction;Prosthetic Dependency   Rehab Potential Good   Clinical Impairments Affecting Rehab Potential Dialysis limits strength and endurance with slow gains.    PT Frequency 2x / week   PT Duration Other (comment)  17 weeks (4 months - 120 days)   PT Treatment/Interventions ADLs/Self Care Home Management;DME Instruction;Gait training;Functional mobility training;Therapeutic activities;Therapeutic exercise;Balance training;Neuromuscular re-education;Patient/family education;Prosthetic Training;Passive range of motion   PT Next Visit Plan Continue gait training including barriers, postural re-education;barriers with walker/prostheses.  Reinforcement of RW spacing,hand placement, and proper weight shift during transfers.  Reinforce importance of proper wt. shift during stand>sit.     Consulted and Agree with Plan of Care Patient;Family member/caregiver   Family Member Consulted dtr        Problem List Patient Active Problem List   Diagnosis Date Noted  . Complications, amputation stump late ([place]) 05/20/2014  . Weakness generalized 04/07/2014  .  Sepsis ([place]) 04/07/2014  . Decubitus ulcer, stage II 04/07/2014  . Decubitus ulcer of ankle   . Fatigue   . Wound infection ([place]) 04/06/2014  . Decubitus ulcer of sacral region, stage 2 03/07/2014  . Glaucoma 03/07/2014  . Gout 03/07/2014  . Below knee amputation status  ([place]) 02/18/2014  . Osteomyelitis ([place]) 01/04/2014  . Phantom limb ([place]) 12/14/2013  . Type 1 diabetes mellitus with diabetic foot infection ([place])   . Diabetes mellitus with peripheral vascular disease ([place])   . Asthma 05/17/2013  . History of MRSA infection 04/22/2013  . Peripheral vascular disease ([place]) 11/19/2012  . End-stage renal disease on hemodialysis ([place]) 05/05/2012  . Kahler disease ([place]) 03/30/2012  . Anemia in chronic kidney disease 03/19/2012  . Hypothyroidism 03/19/2012  . Anemia, iron deficiency 03/19/2012  . Hypertension 03/18/2012  . Chronic kidney disease (CKD), stage V ([place]) 03/18/2012  . Cardiac conduction disorder 03/18/2012  . MGUS (monoclonal gammopathy of unknown significance) 02/28/2011  . Monoclonal paraproteinemia 02/28/2011    Caro Brundidge PT, DPT 11/29/2014, 12:14 PM  [facility] [address] [place], [place], [address] Phone: [phone]   Fax:  [phone]  Name: Jack [name] MRN: [id number] Date of Birth: 1945/09/27

## 2014-12-01 ENCOUNTER — Encounter: Payer: Self-pay | Admitting: Physical Therapy

## 2014-12-01 ENCOUNTER — Ambulatory Visit: Payer: Medicare Other | Admitting: Physical Therapy

## 2014-12-01 DIAGNOSIS — R29898 Other symptoms and signs involving the musculoskeletal system: Secondary | ICD-10-CM | POA: Diagnosis not present

## 2014-12-01 DIAGNOSIS — Z89512 Acquired absence of left leg below knee: Secondary | ICD-10-CM

## 2014-12-01 DIAGNOSIS — Z4789 Encounter for other orthopedic aftercare: Secondary | ICD-10-CM

## 2014-12-01 DIAGNOSIS — R2689 Other abnormalities of gait and mobility: Secondary | ICD-10-CM

## 2014-12-01 DIAGNOSIS — Z89511 Acquired absence of right leg below knee: Secondary | ICD-10-CM

## 2014-12-01 DIAGNOSIS — R6889 Other general symptoms and signs: Secondary | ICD-10-CM

## 2014-12-01 DIAGNOSIS — M25669 Stiffness of unspecified knee, not elsewhere classified: Secondary | ICD-10-CM

## 2014-12-01 DIAGNOSIS — R269 Unspecified abnormalities of gait and mobility: Secondary | ICD-10-CM

## 2014-12-01 DIAGNOSIS — R2681 Unsteadiness on feet: Secondary | ICD-10-CM

## 2014-12-01 NOTE — Therapy (Signed)
[facility] [address] [place], [place], [address] Phone: [phone]   Fax:  [phone]  Physical Therapy Treatment  Patient Details  Name: Jack [name] MRN: [id number] Date of Birth: [birth year] Referring Provider: Meridee Score, MD  Encounter Date: 12/01/2014      PT End of Session - 12/01/14 0907    Visit Number 26   Number of Visits 35   Date for PT Re-Evaluation 12/17/14   Authorization Type G-Code every 10 visits   PT Start Time 0802   PT Stop Time 0845   PT Time Calculation (min) 43 min   Equipment Utilized During Treatment Gait belt   Activity Tolerance Patient tolerated treatment well   Behavior During Therapy [facility] for tasks assessed/performed      Past Medical History  Diagnosis Date  . Hypertension   . Pneumonia     2012  . Heart murmur   . Glaucoma   . Multiple myeloma, without mention of having achieved remission 03/30/2012    Cytogenetic neg on 03/23/2012.  . Asthma   . Hyperparathyroidism, secondary renal ([place])   . Peripheral arterial disease ([place])   . End-stage renal disease on hemodialysis [facility])     Started HD March 2014.  Cause of ESRD was DM.  Gets HD at [facility] on [address] on MWF schedule.   . Thyroid disease     hyperparathyroidism  . MRSA bacteremia   . Anemia   . Peripheral vascular disease, unspecified ([place]) 11/19/2012    In the past had R foot toe amps then R TMA. In 2015 had left foot toe amp > then TMA >then L BKA on 05/14/13   . History of MRSA infection 04/22/2013    Bacteremia assoc w L foot wound infection Mar 2015   . Gangrene of foot ([place])   . ESRD on hemodialysis ([place])   . Diabetes mellitus with peripheral vascular disease ([place])   . Hypothyroidism   . Decubitus ulcer of sacral region, stage 2 03/07/2014  . Glaucoma 03/07/2014  . Gangrene ([place])     right BKA  . Contracture of joint     left knee    Past Surgical History  Procedure Laterality Date  .  Thyroidectomy    . Cervical disc surgery    . Insertion of dialysis catheter Right 03/19/2012    Procedure: INSERTION OF DIALYSIS CATHETER;  Surgeon: Mal Misty, MD;  Location: [place];  Service: Vascular;  Laterality: Right;  Right Internal Jugular  . Av fistula placement Left 03/25/2012    Procedure: ARTERIOVENOUS (AV) FISTULA CREATION;  Surgeon: Mal Misty, MD;  Location: [place];  Service: Vascular;  Laterality: Left;  . Ligation of competing branches of arteriovenous fistula Left 05/08/2012    Procedure: LIGATION OF COMPETING BRANCHES OF ARTERIOVENOUS FISTULA;  Surgeon: Mal Misty, MD;  Location: [place];  Service: Vascular;  Laterality: Left;  Ultrasound guided  . Cardiac catheterization      approx 30 years ago  . Amputation Right 11/10/2012    Procedure: AMPUTATION FIRST and SECOND TOES Right Foot;  Surgeon: Elam Dutch, MD;  Location[facility] OR;  Service: Vascular;  Laterality: Right;  . Transmetatarsal amputation Left 12/16/2012    Procedure: TRANSMETATARSAL AMPUTATION AND VAC PLACEMENT;  Surgeon: Elam Dutch, MD;  Location: [place];  Service: Vascular;  Laterality: Left;  . Amputation Left 04/07/2013    Procedure: AMPUTATION DIGIT- LEFT 1ST TOE;  Surgeon: Mal Misty, MD;  Location: [facility] OR;  Service: Vascular;  Laterality: Left;  . Tee without cardioversion N/A 04/20/2013    Procedure: TRANSESOPHAGEAL ECHOCARDIOGRAM (TEE);  Surgeon: Josue Hector, MD;  Location: [facility] ENDOSCOPY;  Service: Cardiovascular;  Laterality: N/A;  . I&d extremity Left 04/22/2013    Procedure: [id number] HFG DEBRIDEMENT LEFT FIRST TOE AMPUTATION WOUND ;  Surgeon: Mal Misty, MD;  Location: [place];  Service: Vascular;  Laterality: Left;  . Amputation Left 04/26/2013    Procedure: Left Foot Transmetatarsal Amputation;  Surgeon: Newt Minion, MD;  Location: [place];  Service: Orthopedics;  Laterality: Left;  . Toe amputation      D/C 04-30-13  . Below knee leg amputation Left 05/14/2013    DR [name]  .  Amputation Left 05/14/2013    Procedure: AMPUTATION BELOW KNEE;  Surgeon: Newt Minion, MD;  Location: [place];  Service: Orthopedics;  Laterality: Left;  Left Below Knee Amputation  . Eye surgery Bilateral     CATARACTS  . Amputation Left 07/23/2013    Procedure: AMPUTATION BELOW KNEE;  Surgeon: Newt Minion, MD;  Location: [place];  Service: Orthopedics;  Laterality: Left;  Left Below Knee Amputation Revision  . Abdominal aortagram Bilateral 11/06/2012    Procedure: ABDOMINAL AORTAGRAM;  Surgeon: Elam Dutch, MD;  Location: [facility] CATH LAB;  Service: Cardiovascular;  Laterality: Bilateral;  . I&d extremity Right 01/05/2014    Procedure: IRRIGATION AND DEBRIDEMENT Right Heel Ulcer;  Surgeon: Mcarthur Rossetti, MD;  Location: [place];  Service: Orthopedics;  Laterality: Right;  Surgeon Available after 5PM  . Below knee leg amputation Right 02/18/2014    dr duda  . Amputation Right 02/18/2014    Procedure: AMPUTATION BELOW KNEE;  Surgeon: Newt Minion, MD;  Location: [place];  Service: Orthopedics;  Laterality: Right;  . Stump revision Right 05/20/2014    Procedure: Revision Right Below Knee Amputation;  Surgeon: Newt Minion, MD;  Location: [place];  Service: Orthopedics;  Laterality: Right;  . Repair quadriceps/hamstring muscles Left 05/20/2014    Procedure: Left Hamstring Release;  Surgeon: Newt Minion, MD;  Location: [place];  Service: Orthopedics;  Laterality: Left;    There were no vitals filed for this visit.  Visit Diagnosis:  Weakness of both legs  Decreased functional activity tolerance  Abnormality of gait  Encounter for prosthetic gait training  Unsteadiness  Balance problems  Status post bilateral below knee amputation (HCC)  Activity intolerance  Decreased ROM of lower extremity  Status post below knee amputation of left lower extremity (HCC)      Subjective Assessment - 12/01/14 0858    Subjective No complaints. He is wearing prostheses all awake hours except to  dialysis ~ 10 hrs a day.     Patient Stated Goals He wants to use prostheses to walk in home & community.   Currently in Pain? No/denies   Multiple Pain Sites No      Scapular retraction strengthening (to increase scapular retractor strength; to promote upright posture): - scapular retraction 5 x 10 with blue TB; verbal cues provided for pacing; performed during each rest trial with gait.          [facility] Adult PT Treatment/Exercise - 12/01/14 0859    Transfers   Transfers Sit to Stand;Stand to Sit   Sit to Stand 5: Supervision;With upper extremity assist;With armrests;From chair/3-in-1   Sit to Stand Details Verbal cues for precautions/safety;Verbal cues for technique   Sit to Stand Details (indicate  cue type and reason) Pt. improving on technique and sit<>stand time requiring less verbal cues for process.     Stand to Sit 5: Supervision;With upper extremity assist;With armrests;To chair/3-in-1   Stand to Sit Details (indicate cue type and reason) Verbal cues for technique;Verbal cues for precautions/safety   Stand to Sit Details Pt. improving on technique requiring less verbal cues to walk feet out to avoid over flexion at knees; premature posterior wt. shift only noted once due to fatigue.     Ambulation/Gait   Ambulation/Gait Yes   Ambulation/Gait Assistance 5: Supervision   Ambulation/Gait Assistance Details gait trials 23f, 177f [id number] cues provided for upright posture and walker spacing from BOS.     Ambulation Distance (Feet) 27 Feet plus see above   Assistive device Prostheses;Rolling walker   Gait Pattern Step-through pattern;Decreased step length - right;Decreased step length - left;Decreased stride length;Right flexed knee in stance;Left flexed knee in stance;Decreased trunk rotation;Trunk flexed;Narrow base of support;Decreased hip/knee flexion - left;Decreased hip/knee flexion - right   Ambulation Surface Level;Indoor   Stairs Yes   Stairs Assistance 3:  Mod assist;Other (comment)  two people for safety   Stair Management Technique Two rails;Step to pattern;Forwards   Number of Stairs 4   Ramp 3: Mod assist, 2 person assist for safety   Ramp Details (indicate cue type and reason) Verbal cues provided for technique throughout.    Curb 3: Mod assist, 2 person assist for safety   Curb Details (indicate cue type and reason) Verbal cues provided throughout for proper technique.              PT Short Term Goals - 11/23/14 1440    PT SHORT TERM GOAL #1   Title Patient donnes prostheses with cues only. (Target Date: 09/16/2014)   Baseline MET 09/22/2014   Status Achieved   PT SHORT TERM GOAL #2   Title Patient tolerates bilateral prostheses wear >6hrs total per day without skin issues or limb pain.  (Target Date: 09/16/2014)   Baseline MET 09/22/2014   Status Achieved   PT SHORT TERM GOAL #3   Title Squat-pivot transfer w/c to level mat modified independent with buttock clearance.  (Target Date: 09/16/2014)   Baseline 09/22/2014 MET with general cues to lift buttocks more   Status Achieved   PT SHORT TERM GOAL #4   Title Sit to / from stand w/c to parallel bars with moderate assist  (Target Date: 09/16/2014)   Baseline MET 09/13/2014   Status Achieved   PT SHORT TERM GOAL #5   Title Patient ambulates 10' in parallel bars with prostheses with max assist of 1 person with 2nd person supervising for safety.  (Target Date: 09/16/2014)   Baseline MET 09/13/2014   Status Achieved   PT SHORT TERM GOAL #6   Title Patient tolerates wear of prostheses >90% of awake hours, at dialysis may remove prostheses for comfort, with PT guidance cues. (Target Date: 10/14/2014) NEW Target Date 11/17/2014   Baseline 11/15/14: Pt. tolerates wear of prosthesis 9 hrs of 12 hr awake daily.     Status Not Met   PT SHORT TERM GOAL #7   Title Patient performs stand-pivot transfers w/c to chair with armrests with RW & prostheses with minimal assist. (Target Date: 10/14/2014)   Baseline  10/11/14: met today   Status Achieved   PT SHORT TERM GOAL #8   Title Patient ambulates 25' w94h RW & prostheses including turning to sit in chair with armrests  with MinA. (Target Date: 10/14/2014)   Baseline 10/13/2014 MET    Status Achieved   PT SHORT TERM GOAL #9   TITLE Patient ambulates 38' with family assistance with RW & prosthesis with PT supervising / cueing for safety. (NEW Target Date 11/17/2014)   Baseline 11/15/14: per patient/daughter walking 15-20' at home.    Status Partially Met   PT SHORT TERM GOAL #10   TITLE Patient able to maintain upright wtih RW support for 2 minutes & reach 3" anteriorly and to knee level with contact assist. (NEW Target Date 11/17/2014)   Baseline on 11/15/14   Status Achieved           PT Long Term Goals - 11/29/14 0805    PT LONG TERM GOAL #1   Title Tolerates wear of prostheses >90% of awake hours (except dialysis) without change in skin integrity nor undue tenderness.  (Target Date: 12/16/2014)   Status On-going   PT LONG TERM GOAL #2   Title Patient demonstrates / verbalizes proper prosthetic care for safe use of prostheses without issues.  (Target Date: 12/16/2014)   Status On-going   PT LONG TERM GOAL #3   Title Sit to /from stand and stand-pivot transfers with RW & prostheses modified independent.  (Target Date: 12/16/2014)   Status On-going   PT LONG TERM GOAL #4   Title Standing balance with RW & prostheses manages clothes & reaches 5" with supervision.  (Target Date: 12/16/2014)   Baseline met on 10/18/14   Status Achieved   PT LONG TERM GOAL #5   Title Patient ambulates 100' with RW & prostheses with minimal assist from family safely.  (Target Date: 12/16/2014)   Status Revised   Additional Long Term Goals   Additional Long Term Goals Yes   PT LONG TERM GOAL #6   Title Patient negotiates ramps & curbs with modA with RW & prostheses. (Target Date: 12/16/2014)   Status New               Plan - 12/01/14 0908    Clinical  Impression Statement Pt. did well for with ambulation trials showing only one premature posterior wt. shift with stand>sit.  Pt. decreasd assistance level required and required less verbal cues for stair/ramp navigation.  Pt. continues to progress toward goals.     Pt will benefit from skilled therapeutic intervention in order to improve on the following deficits Abnormal gait;Decreased activity tolerance;Decreased balance;Decreased endurance;Decreased knowledge of precautions;Decreased knowledge of use of DME;Decreased mobility;Decreased range of motion;Decreased strength;Postural dysfunction;Prosthetic Dependency   Rehab Potential Good   Clinical Impairments Affecting Rehab Potential Dialysis limits strength and endurance with slow gains.    PT Frequency 2x / week   PT Duration Other (comment)  17 weeks (4 months - 120 days)   PT Treatment/Interventions ADLs/Self Care Home Management;DME Instruction;Gait training;Functional mobility training;Therapeutic activities;Therapeutic exercise;Balance training;Neuromuscular re-education;Patient/family education;Prosthetic Training;Passive range of motion   PT Next Visit Plan Continue gait training including barriers, postural re-education;barriers with walker/prostheses.  Reinforcement of RW spacing, hand placement, and proper weight shift during transfers.  Reinforce importance of proper wt. shift during stand>sit.     Consulted and Agree with Plan of Care Patient;Family member/caregiver   Family Member Consulted dtr        Problem List Patient Active Problem List   Diagnosis Date Noted  . Complications, amputation stump late ([place]) 05/20/2014  . Weakness generalized 04/07/2014  . Sepsis ([place]) 04/07/2014  . Decubitus ulcer, stage II 04/07/2014  . Decubitus  ulcer of ankle   . Fatigue   . Wound infection ([place]) 04/06/2014  . Decubitus ulcer of sacral region, stage 2 03/07/2014  . Glaucoma 03/07/2014  . Gout 03/07/2014  . Below knee amputation  status ([place]) 02/18/2014  . Osteomyelitis ([place]) 01/04/2014  . Phantom limb ([place]) 12/14/2013  . Type 1 diabetes mellitus with diabetic foot infection ([place])   . Diabetes mellitus with peripheral vascular disease ([place])   . Asthma 05/17/2013  . History of MRSA infection 04/22/2013  . Peripheral vascular disease ([place]) 11/19/2012  . End-stage renal disease on hemodialysis ([place]) 05/05/2012  . Kahler disease ([place]) 03/30/2012  . Anemia in chronic kidney disease 03/19/2012  . Hypothyroidism 03/19/2012  . Anemia, iron deficiency 03/19/2012  . Hypertension 03/18/2012  . Chronic kidney disease (CKD), stage V ([place]) 03/18/2012  . Cardiac conduction disorder 03/18/2012  . MGUS (monoclonal gammopathy of unknown significance) 02/28/2011  . Monoclonal paraproteinemia 02/28/2011    Bess Harvest 12/01/2014, 9:13 AM  Bess Harvest, [place]  Name: GHAZI RUMPF MRN: [id number] Date of Birth: [birth year]  This note has been reviewed and edited by supervising CI.  Willow Ora, PTA, [facility] [address] [place], [place] [address] [phone] 12/01/2014, 4:17 PM

## 2014-12-06 ENCOUNTER — Encounter: Payer: Self-pay | Admitting: Physical Therapy

## 2014-12-06 ENCOUNTER — Ambulatory Visit: Payer: Medicare Other | Admitting: Physical Therapy

## 2014-12-06 DIAGNOSIS — R29898 Other symptoms and signs involving the musculoskeletal system: Secondary | ICD-10-CM

## 2014-12-06 DIAGNOSIS — M24669 Ankylosis, unspecified knee: Secondary | ICD-10-CM

## 2014-12-06 DIAGNOSIS — M25659 Stiffness of unspecified hip, not elsewhere classified: Secondary | ICD-10-CM

## 2014-12-06 DIAGNOSIS — R269 Unspecified abnormalities of gait and mobility: Secondary | ICD-10-CM

## 2014-12-06 DIAGNOSIS — R6889 Other general symptoms and signs: Secondary | ICD-10-CM

## 2014-12-06 DIAGNOSIS — Z89512 Acquired absence of left leg below knee: Secondary | ICD-10-CM

## 2014-12-06 DIAGNOSIS — Z4789 Encounter for other orthopedic aftercare: Secondary | ICD-10-CM

## 2014-12-06 DIAGNOSIS — R2681 Unsteadiness on feet: Secondary | ICD-10-CM

## 2014-12-06 DIAGNOSIS — Z89511 Acquired absence of right leg below knee: Secondary | ICD-10-CM

## 2014-12-06 DIAGNOSIS — M25669 Stiffness of unspecified knee, not elsewhere classified: Secondary | ICD-10-CM

## 2014-12-06 DIAGNOSIS — R2689 Other abnormalities of gait and mobility: Secondary | ICD-10-CM

## 2014-12-06 NOTE — Therapy (Signed)
[facility] [address] [place], [place], [address] Phone: [phone]   Fax:  [phone]  Physical Therapy Treatment  Patient Details  Name: Jack [name] MRN: [id number] Date of Birth: March 15, 1945 Referring Provider: Meridee Score, MD  Encounter Date: 12/06/2014      PT End of Session - 12/06/14 1222    Visit Number 27   Number of Visits 35   Date for PT Re-Evaluation 12/17/14   Authorization Type G-Code every 10 visits   PT Start Time 0802   PT Stop Time 0845   PT Time Calculation (min) 43 min   Equipment Utilized During Treatment Gait belt   Activity Tolerance Patient tolerated treatment well   Behavior During Therapy [facility] for tasks assessed/performed      Past Medical History  Diagnosis Date  . Hypertension   . Pneumonia     2012  . Heart murmur   . Glaucoma   . Multiple myeloma, without mention of having achieved remission 03/30/2012    Cytogenetic neg on 03/23/2012.  . Asthma   . Hyperparathyroidism, secondary renal ([place])   . Peripheral arterial disease ([place])   . End-stage renal disease on hemodialysis [facility])     Started HD March 2014.  Cause of ESRD was DM.  Gets HD at [facility] on [address] on MWF schedule.   . Thyroid disease     hyperparathyroidism  . MRSA bacteremia   . Anemia   . Peripheral vascular disease, unspecified ([place]) 11/19/2012    In the past had R foot toe amps then R TMA. In 2015 had left foot toe amp > then TMA >then L BKA on 05/14/13   . History of MRSA infection 04/22/2013    Bacteremia assoc w L foot wound infection Mar 2015   . Gangrene of foot ([place])   . ESRD on hemodialysis ([place])   . Diabetes mellitus with peripheral vascular disease ([place])   . Hypothyroidism   . Decubitus ulcer of sacral region, stage 2 03/07/2014  . Glaucoma 03/07/2014  . Gangrene ([place])     right BKA  . Contracture of joint     left knee    Past Surgical History  Procedure Laterality Date  .  Thyroidectomy    . Cervical disc surgery    . Insertion of dialysis catheter Right 03/19/2012    Procedure: INSERTION OF DIALYSIS CATHETER;  Surgeon: Mal Misty, MD;  Location: [place];  Service: Vascular;  Laterality: Right;  Right Internal Jugular  . Av fistula placement Left 03/25/2012    Procedure: ARTERIOVENOUS (AV) FISTULA CREATION;  Surgeon: Mal Misty, MD;  Location: [place];  Service: Vascular;  Laterality: Left;  . Ligation of competing branches of arteriovenous fistula Left 05/08/2012    Procedure: LIGATION OF COMPETING BRANCHES OF ARTERIOVENOUS FISTULA;  Surgeon: Mal Misty, MD;  Location: [place];  Service: Vascular;  Laterality: Left;  Ultrasound guided  . Cardiac catheterization      approx 30 years ago  . Amputation Right 11/10/2012    Procedure: AMPUTATION FIRST and SECOND TOES Right Foot;  Surgeon: Elam Dutch, MD;  Location: [facility] OR;  Service: Vascular;  Laterality: Right;  . Transmetatarsal amputation Left 12/16/2012    Procedure: TRANSMETATARSAL AMPUTATION AND VAC PLACEMENT;  Surgeon: Elam Dutch, MD;  Location: [place];  Service: Vascular;  Laterality: Left;  . Amputation Left 04/07/2013    Procedure: AMPUTATION DIGIT- LEFT 1ST TOE;  Surgeon: Mal Misty, MD;  Location: [facility] OR;  Service: Vascular;  Laterality: Left;  . Tee without cardioversion N/A 04/20/2013    Procedure: TRANSESOPHAGEAL ECHOCARDIOGRAM (TEE);  Surgeon: Josue Hector, MD;  Location[facility] ENDOSCOPY;  Service: Cardiovascular;  Laterality: N/A;  . I&d extremity Left 04/22/2013    Procedure: GXQJJHERDE YCX DEBRIDEMENT LEFT FIRST TOE AMPUTATION WOUND ;  Surgeon: Mal Misty, MD;  Location: [place];  Service: Vascular;  Laterality: Left;  . Amputation Left 04/26/2013    Procedure: Left Foot Transmetatarsal Amputation;  Surgeon: Newt Minion, MD;  Location: [place];  Service: Orthopedics;  Laterality: Left;  . Toe amputation      D/C 04-30-13  . Below knee leg amputation Left 05/14/2013    DR [name]  .  Amputation Left 05/14/2013    Procedure: AMPUTATION BELOW KNEE;  Surgeon: Newt Minion, MD;  Location: [place];  Service: Orthopedics;  Laterality: Left;  Left Below Knee Amputation  . Eye surgery Bilateral     CATARACTS  . Amputation Left 07/23/2013    Procedure: AMPUTATION BELOW KNEE;  Surgeon: Newt Minion, MD;  Location: [place];  Service: Orthopedics;  Laterality: Left;  Left Below Knee Amputation Revision  . Abdominal aortagram Bilateral 11/06/2012    Procedure: ABDOMINAL AORTAGRAM;  Surgeon: Elam Dutch, MD;  Location: [facility] CATH LAB;  Service: Cardiovascular;  Laterality: Bilateral;  . I&d extremity Right 01/05/2014    Procedure: IRRIGATION AND DEBRIDEMENT Right Heel Ulcer;  Surgeon: Mcarthur Rossetti, MD;  Location: [place];  Service: Orthopedics;  Laterality: Right;  Surgeon Available after 5PM  . Below knee leg amputation Right 02/18/2014    dr duda  . Amputation Right 02/18/2014    Procedure: AMPUTATION BELOW KNEE;  Surgeon: Newt Minion, MD;  Location: [place];  Service: Orthopedics;  Laterality: Right;  . Stump revision Right 05/20/2014    Procedure: Revision Right Below Knee Amputation;  Surgeon: Newt Minion, MD;  Location: [place];  Service: Orthopedics;  Laterality: Right;  . Repair quadriceps/hamstring muscles Left 05/20/2014    Procedure: Left Hamstring Release;  Surgeon: Newt Minion, MD;  Location: [name];  Service: Orthopedics;  Laterality: Left;    There were no vitals filed for this visit.  Visit Diagnosis:  Weakness of both legs  Decreased functional activity tolerance  Abnormality of gait  Encounter for prosthetic gait training  Unsteadiness  Balance problems  Status post bilateral below knee amputation (HCC)  Activity intolerance  Decreased ROM of lower extremity  Status post below knee amputation of left lower extremity (HCC)  Decreased range of knee movement, unspecified laterality  Decreased range of hip movement, unspecified laterality       Subjective Assessment - 12/06/14 1205    Subjective No complaints. He is wearing prostheses all awake hours except to dialysis ~ 10 hrs a day.  Pt. reports 2 hr nap most days without prostheses.  Average prosthetic wear daily ~ 83 %.     Patient is accompained by: Family member   Patient Stated Goals He wants to use prostheses to walk in home & community.   Currently in Pain? No/denies   Pain Score 0-No pain   Multiple Pain Sites No        Scapular retraction strengthening (to increase scapular retractor strength; to promote upright posture): - scapular retraction 5 x 15 with blue TB; verbal cues provided for pacing; performed during each rest trial with gait.         [facility] Adult  PT Treatment/Exercise - 12/06/14 1208    Transfers   Transfers Sit to Stand;Stand to Sit   Sit to Stand 5: Supervision;With upper extremity assist;With armrests;From chair/3-in-1   Sit to Stand Details Verbal cues for precautions/safety;Verbal cues for technique   Sit to Stand Details (indicate cue type and reason) Pt. continues to require decreased verbal cues for technique with sit <> stand.     Stand to Sit 5: Supervision;With upper extremity assist;With armrests;To chair/3-in-1   Stand to Sit Details (indicate cue type and reason) Verbal cues for technique;Verbal cues for precautions/safety   Stand to Sit Details pt. continues to improve on technique with stand > sit to avoid overflexion of knees by walking feet out; premature posterior wt. shift only noted x 1 with fatigue after ramp/curb navigation.       Ambulation/Gait   Ambulation/Gait Yes   Ambulation/Gait Assistance 5: Supervision   Ambulation/Gait Assistance Details ambulation distance of 148f with RW supervision achieved today; verbal cues provided to maintain upright posture; no premature posterior wt. shift noted even following 1055fambulation trial with RW.     Ambulation Distance (Feet) 100 Feet  [id number].   Assistive  device Prostheses;Rolling walker   Gait Pattern Step-through pattern;Decreased step length - right;Decreased step length - left;Decreased stride length;Right flexed knee in stance;Left flexed knee in stance;Decreased trunk rotation;Trunk flexed;Narrow base of support;Decreased hip/knee flexion - left;Decreased hip/knee flexion - right   Ambulation Surface Level;Indoor   Stairs No   Ramp 4: Min assist  + 2 for safety   Ramp Details (indicate cue type and reason) Pt. demo'd proper technique only requiring verbal cues for shortened step length and staggered feet with ascending.     Curb 4: Min assist  + 2 for safety.     Curb Details (indicate cue type and reason) Pt. required verbal cues for technique throughout; verbal cues for staggered feet with ascending curb required.              PT Short Term Goals - 11/23/14 1440    PT SHORT TERM GOAL #1   Title Patient donnes prostheses with cues only. (Target Date: 09/16/2014)   Baseline MET 09/22/2014   Status Achieved   PT SHORT TERM GOAL #2   Title Patient tolerates bilateral prostheses wear >6hrs total per day without skin issues or limb pain.  (Target Date: 09/16/2014)   Baseline MET 09/22/2014   Status Achieved   PT SHORT TERM GOAL #3   Title Squat-pivot transfer w/c to level mat modified independent with buttock clearance.  (Target Date: 09/16/2014)   Baseline 09/22/2014 MET with general cues to lift buttocks more   Status Achieved   PT SHORT TERM GOAL #4   Title Sit to / from stand w/c to parallel bars with moderate assist  (Target Date: 09/16/2014)   Baseline MET 09/13/2014   Status Achieved   PT SHORT TERM GOAL #5   Title Patient ambulates 10' in parallel bars with prostheses with max assist of 1 person with 2nd person supervising for safety.  (Target Date: 09/16/2014)   Baseline MET 09/13/2014   Status Achieved   PT SHORT TERM GOAL #6   Title Patient tolerates wear of prostheses >90% of awake hours, at dialysis may remove prostheses for  comfort, with PT guidance cues. (Target Date: 10/14/2014) NEW Target Date 11/17/2014   Baseline 11/15/14: Pt. tolerates wear of prosthesis 9 hrs of 12 hr awake daily.     Status Not Met  PT SHORT TERM GOAL #7   Title Patient performs stand-pivot transfers w/c to chair with armrests with RW & prostheses with minimal assist. (Target Date: 10/14/2014)   Baseline 10/11/14: met today   Status Achieved   PT SHORT TERM GOAL #8   Title Patient ambulates 25' with RW & prostheses including turning to sit in chair with armrests with MinA. (Target Date: 10/14/2014)   Baseline 10/13/2014 MET    Status Achieved   PT SHORT TERM GOAL #9   TITLE Patient ambulates 17' with family assistance with RW & prosthesis with PT supervising / cueing for safety. (NEW Target Date 11/17/2014)   Baseline 11/15/14: per patient/daughter walking 15-20' at home.    Status Partially Met   PT SHORT TERM GOAL #10   TITLE Patient able to maintain upright wtih RW support for 2 minutes & reach 3" anteriorly and to knee level with contact assist. (NEW Target Date 11/17/2014)   Baseline on 11/15/14   Status Achieved           PT Long Term Goals - 11/29/14 0805    PT LONG TERM GOAL #1   Title Tolerates wear of prostheses >90% of awake hours (except dialysis) without change in skin integrity nor undue tenderness.  (Target Date: 12/16/2014)   Status On-going   PT LONG TERM GOAL #2   Title Patient demonstrates / verbalizes proper prosthetic care for safe use of prostheses without issues.  (Target Date: 12/16/2014)   Status On-going   PT LONG TERM GOAL #3   Title Sit to /from stand and stand-pivot transfers with RW & prostheses modified independent.  (Target Date: 12/16/2014)   Status On-going   PT LONG TERM GOAL #4   Title Standing balance with RW & prostheses manages clothes & reaches 5" with supervision.  (Target Date: 12/16/2014)   Baseline met on 10/18/14   Status Achieved   PT LONG TERM GOAL #5   Title Patient ambulates 100' with RW  & prostheses with minimal assist from family safely.  (Target Date: 12/16/2014)   Status Revised   Additional Long Term Goals   Additional Long Term Goals Yes   PT LONG TERM GOAL #6   Title Patient negotiates ramps & curbs with modA with RW & prostheses. (Target Date: 12/16/2014)   Status New            Plan - 12/06/14 1223    Clinical Impression Statement Pt. showed increased activity tolerance today ambulating 143f with RW with supervision; less verbal cues required today for safe transfer and ramp/curb navigation. Pt. continues to progress toward goals.     Pt will benefit from skilled therapeutic intervention in order to improve on the following deficits Abnormal gait;Decreased activity tolerance;Decreased balance;Decreased endurance;Decreased knowledge of precautions;Decreased knowledge of use of DME;Decreased mobility;Decreased range of motion;Decreased strength;Postural dysfunction;Prosthetic Dependency   Rehab Potential Good   Clinical Impairments Affecting Rehab Potential Dialysis limits strength and endurance with slow gains.    PT Frequency 2x / week   PT Duration Other (comment)  17 weeks (4 months - 120 days)   PT Treatment/Interventions ADLs/Self Care Home Management;DME Instruction;Gait training;Functional mobility training;Therapeutic activities;Therapeutic exercise;Balance training;Neuromuscular re-education;Patient/family education;Prosthetic Training;Passive range of motion   PT Next Visit Plan Continue gait training including barriers, postural re-education;barriers with walker/prostheses.  Reinforce importance of proper wt. shift during stand>sit.     Consulted and Agree with Plan of Care Patient;Family member/caregiver   Family Member Consulted dtr        Problem  List Patient Active Problem List   Diagnosis Date Noted  . Complications, amputation stump late ([place]) 05/20/2014  . Weakness generalized 04/07/2014  . Sepsis ([place]) 04/07/2014  . Decubitus ulcer,  stage II 04/07/2014  . Decubitus ulcer of ankle   . Fatigue   . Wound infection ([place]) 04/06/2014  . Decubitus ulcer of sacral region, stage 2 03/07/2014  . Glaucoma 03/07/2014  . Gout 03/07/2014  . Below knee amputation status ([place]) 02/18/2014  . Osteomyelitis ([place]) 01/04/2014  . Phantom limb ([place]) 12/14/2013  . Type 1 diabetes mellitus with diabetic foot infection ([place])   . Diabetes mellitus with peripheral vascular disease ([place])   . Asthma 05/17/2013  . History of MRSA infection 04/22/2013  . Peripheral vascular disease ([place]) 11/19/2012  . End-stage renal disease on hemodialysis ([place]) 05/05/2012  . Kahler disease ([place]) 03/30/2012  . Anemia in chronic kidney disease 03/19/2012  . Hypothyroidism 03/19/2012  . Anemia, iron deficiency 03/19/2012  . Hypertension 03/18/2012  . Chronic kidney disease (CKD), stage V ([place]) 03/18/2012  . Cardiac conduction disorder 03/18/2012  . MGUS (monoclonal gammopathy of unknown significance) 02/28/2011  . Monoclonal paraproteinemia 02/28/2011    Bess Harvest 12/06/2014, 12:36 PM  Bess Harvest, [place]  Name: Jack [name] MRN: [id number] Date of Birth: 16-Aug-1945  This note has been reviewed and edited by supervising CI.  Willow Ora, PTA, [facility] [address] [place], [place] [address] [phone] 12/07/2014, 11:30 AM

## 2014-12-13 ENCOUNTER — Ambulatory Visit: Payer: Medicare Other | Admitting: Physical Therapy

## 2014-12-13 ENCOUNTER — Encounter: Payer: Self-pay | Admitting: Physical Therapy

## 2014-12-13 DIAGNOSIS — R29898 Other symptoms and signs involving the musculoskeletal system: Secondary | ICD-10-CM | POA: Diagnosis not present

## 2014-12-13 DIAGNOSIS — M25669 Stiffness of unspecified knee, not elsewhere classified: Secondary | ICD-10-CM

## 2014-12-13 DIAGNOSIS — R2689 Other abnormalities of gait and mobility: Secondary | ICD-10-CM

## 2014-12-13 DIAGNOSIS — M25659 Stiffness of unspecified hip, not elsewhere classified: Secondary | ICD-10-CM

## 2014-12-13 DIAGNOSIS — Z89512 Acquired absence of left leg below knee: Secondary | ICD-10-CM

## 2014-12-13 DIAGNOSIS — M24669 Ankylosis, unspecified knee: Secondary | ICD-10-CM

## 2014-12-13 DIAGNOSIS — R2681 Unsteadiness on feet: Secondary | ICD-10-CM

## 2014-12-13 DIAGNOSIS — R6889 Other general symptoms and signs: Secondary | ICD-10-CM

## 2014-12-13 DIAGNOSIS — Z4789 Encounter for other orthopedic aftercare: Secondary | ICD-10-CM

## 2014-12-13 DIAGNOSIS — Z89511 Acquired absence of right leg below knee: Secondary | ICD-10-CM

## 2014-12-13 DIAGNOSIS — R269 Unspecified abnormalities of gait and mobility: Secondary | ICD-10-CM

## 2014-12-13 NOTE — Therapy (Signed)
[facility] [address] [place], [place], [address] Phone: [phone]   Fax:  [phone]  Physical Therapy Treatment  Patient Details  Name: Jack [name] MRN: [id number] Date of Birth: 16-Dec-1945 Referring Provider: Meridee Score, MD  Encounter Date: 12/13/2014      PT End of Session - 12/13/14 1733    Visit Number 28   Number of Visits 35   Date for PT Re-Evaluation 12/17/14   Authorization Type G-Code every 10 visits   PT Start Time 0801   PT Stop Time 0845   PT Time Calculation (min) 44 min   Equipment Utilized During Treatment Gait belt   Activity Tolerance Patient tolerated treatment well;Patient limited by fatigue   Behavior During Therapy [facility] for tasks assessed/performed      Past Medical History  Diagnosis Date  . Hypertension   . Pneumonia     2012  . Heart murmur   . Glaucoma   . Multiple myeloma, without mention of having achieved remission 03/30/2012    Cytogenetic neg on 03/23/2012.  . Asthma   . Hyperparathyroidism, secondary renal ([place])   . Peripheral arterial disease ([place])   . End-stage renal disease on hemodialysis [facility])     Started HD March 2014.  Cause of ESRD was DM.  Gets HD at [facility] on [address] on MWF schedule.   . Thyroid disease     hyperparathyroidism  . MRSA bacteremia   . Anemia   . Peripheral vascular disease, unspecified ([place]) 11/19/2012    In the past had R foot toe amps then R TMA. In 2015 had left foot toe amp > then TMA >then L BKA on 05/14/13   . History of MRSA infection 04/22/2013    Bacteremia assoc w L foot wound infection Mar 2015   . Gangrene of foot ([place])   . ESRD on hemodialysis ([place])   . Diabetes mellitus with peripheral vascular disease ([place])   . Hypothyroidism   . Decubitus ulcer of sacral region, stage 2 03/07/2014  . Glaucoma 03/07/2014  . Gangrene ([place])     right BKA  . Contracture of joint     left knee    Past Surgical History  Procedure  Laterality Date  . Thyroidectomy    . Cervical disc surgery    . Insertion of dialysis catheter Right 03/19/2012    Procedure: INSERTION OF DIALYSIS CATHETER;  Surgeon: Mal Misty, MD;  Location: [place];  Service: Vascular;  Laterality: Right;  Right Internal Jugular  . Av fistula placement Left 03/25/2012    Procedure: ARTERIOVENOUS (AV) FISTULA CREATION;  Surgeon: Mal Misty, MD;  Location: [place];  Service: Vascular;  Laterality: Left;  . Ligation of competing branches of arteriovenous fistula Left 05/08/2012    Procedure: LIGATION OF COMPETING BRANCHES OF ARTERIOVENOUS FISTULA;  Surgeon: Mal Misty, MD;  Location: [place];  Service: Vascular;  Laterality: Left;  Ultrasound guided  . Cardiac catheterization      approx 30 years ago  . Amputation Right 11/10/2012    Procedure: AMPUTATION FIRST and SECOND TOES Right Foot;  Surgeon: Elam Dutch, MD;  Location: [facility] OR;  Service: Vascular;  Laterality: Right;  . Transmetatarsal amputation Left 12/16/2012    Procedure: TRANSMETATARSAL AMPUTATION AND VAC PLACEMENT;  Surgeon: Elam Dutch, MD;  Location: [place];  Service: Vascular;  Laterality: Left;  . Amputation Left 04/07/2013    Procedure: AMPUTATION DIGIT- LEFT 1ST TOE;  Surgeon: Jeneen Rinks  Marlynn Perking, MD;  Location: [facility] OR;  Service: Vascular;  Laterality: Left;  . Tee without cardioversion N/A 04/20/2013    Procedure: TRANSESOPHAGEAL ECHOCARDIOGRAM (TEE);  Surgeon: Wendall Stade, MD;  Location: [facility] ENDOSCOPY;  Service: Cardiovascular;  Laterality: N/A;  . I&d extremity Left 04/22/2013    Procedure: IRRIGATION AND DEBRIDEMENT LEFT FIRST TOE AMPUTATION WOUND ;  Surgeon: Pryor Ochoa, MD;  Location: [facility] OR;  Service: Vascular;  Laterality: Left;  . Amputation Left 04/26/2013    Procedure: Left Foot Transmetatarsal Amputation;  Surgeon: Nadara Mustard, MD;  Location: [facility] OR;  Service: Orthopedics;  Laterality: Left;  . Toe amputation      D/C 04-30-13  . Below knee leg amputation Left 05/14/2013     DR [name]  . Amputation Left 05/14/2013    Procedure: AMPUTATION BELOW KNEE;  Surgeon: Nadara Mustard, MD;  Location: [facility] OR;  Service: Orthopedics;  Laterality: Left;  Left Below Knee Amputation  . Eye surgery Bilateral     CATARACTS  . Amputation Left 07/23/2013    Procedure: AMPUTATION BELOW KNEE;  Surgeon: Nadara Mustard, MD;  Location: [facility] OR;  Service: Orthopedics;  Laterality: Left;  Left Below Knee Amputation Revision  . Abdominal aortagram Bilateral 11/06/2012    Procedure: ABDOMINAL AORTAGRAM;  Surgeon: Sherren Kerns, MD;  Location: [facility] CATH LAB;  Service: Cardiovascular;  Laterality: Bilateral;  . I&d extremity Right 01/05/2014    Procedure: IRRIGATION AND DEBRIDEMENT Right Heel Ulcer;  Surgeon: Kathryne Hitch, MD;  Location: [facility] OR;  Service: Orthopedics;  Laterality: Right;  Surgeon Available after 5PM  . Below knee leg amputation Right 02/18/2014    dr duda  . Amputation Right 02/18/2014    Procedure: AMPUTATION BELOW KNEE;  Surgeon: Nadara Mustard, MD;  Location: [facility] OR;  Service: Orthopedics;  Laterality: Right;  . Stump revision Right 05/20/2014    Procedure: Revision Right Below Knee Amputation;  Surgeon: Nadara Mustard, MD;  Location: [facility] OR;  Service: Orthopedics;  Laterality: Right;  . Repair quadriceps/hamstring muscles Left 05/20/2014    Procedure: Left Hamstring Release;  Surgeon: Nadara Mustard, MD;  Location: [facility] OR;  Service: Orthopedics;  Laterality: Left;    There were no vitals filed for this visit.  Visit Diagnosis:  Weakness of both legs  Decreased functional activity tolerance  Abnormality of gait  Encounter for prosthetic gait training  Unsteadiness  Balance problems  Status post bilateral below knee amputation (HCC)  Activity intolerance  Decreased ROM of lower extremity  Status post below knee amputation of left lower extremity (HCC)  Decreased range of knee movement, unspecified laterality  Decreased range of hip movement, unspecified  laterality      Subjective Assessment - 12/13/14 1721    Subjective No complaints. Pt. reports a 5/10 pain in R knee from arthritis.  He is wearing prostheses all awake hours except to dialysis ~ 10-11 hrs a day.    Patient is accompained by: Family member   Patient Stated Goals He wants to use prostheses to walk in home & community.   Currently in Pain? Yes   Pain Score 5    Pain Location Knee   Pain Orientation Right   Pain Descriptors / Indicators Aching   Pain Type Acute pain   Pain Onset Today   Pain Frequency Constant   Aggravating Factors  increases at night    Pain Relieving Factors after moving for a while.    Multiple Pain Sites No  Scapular retraction strengthening (to increase scapular retractor strength; to promote upright posture): - scapular retraction 2 x 15 with blue TB; verbal cues provided for pacing; performed during each rest trial with gait.       [place] Adult PT Treatment/Exercise - 12/13/14 1725    Transfers   Transfers Sit to Stand;Stand to Sit   Sit to Stand 5: Supervision;With upper extremity assist;With armrests;From chair/3-in-1   Sit to Stand Details Verbal cues for precautions/safety;Verbal cues for technique   Sit to Stand Details (indicate cue type and reason) Pt. requires less verbal cues for proper sit>stand procedure.    Stand to Sit 5: Supervision;With upper extremity assist;With armrests;To chair/3-in-1   Stand to Sit Details (indicate cue type and reason) Verbal cues for technique;Verbal cues for precautions/safety   Stand to Sit Details Pt. required verbal cues for bowing before sitting to allow a slower more controlled descent; pt. demo'd LOB with stand>sit x 1 requiring PTA to recover to avoid falling; pt. demo'd fatigue proceeding LOB; when questioned pt. reported he felt more tired today following dialysis then usual.     Ambulation/Gait   Ambulation/Gait Yes   Ambulation/Gait Assistance 5: Supervision   Ambulation/Gait Assistance  Details Pt. demo'd proper technique with gait with minimal verbal cues; verbal cues provided for upright posture and pacing with pivot proceeding stand>sit.     Ambulation Distance (Feet) 36 Feet  52ft, 22ft, 47ft, 42ft with RW   Assistive device Prostheses;Rolling walker   Gait Pattern Step-through pattern;Decreased step length - right;Decreased step length - left;Decreased stride length;Right flexed knee in stance;Left flexed knee in stance;Decreased trunk rotation;Trunk flexed;Narrow base of support;Decreased hip/knee flexion - left;Decreased hip/knee flexion - right   Ambulation Surface Level;Indoor   Stairs No   Ramp Not tested (comment)   Curb Not tested (comment)   Prosthetics   Current prosthetic wear tolerance (days/week)  7 days /wk   Current prosthetic wear tolerance (#hours/day)  reports wearing all awake hours when out of bed except dialysis or bathing.    Edema none noted   Residual limb condition  intact with no issues   Education Provided Skin check;Residual limb care;Proper weight-bearing schedule/adjustment   Person(s) Educated Patient;Child(ren)   Education Method Explanation;Verbal cues   Education Method Verbalized understanding;Returned demonstration            PT Short Term Goals - 11/23/14 1440    PT SHORT TERM GOAL #1   Title Patient donnes prostheses with cues only. (Target Date: 09/16/2014)   Baseline MET 09/22/2014   Status Achieved   PT SHORT TERM GOAL #2   Title Patient tolerates bilateral prostheses wear >6hrs total per day without skin issues or limb pain.  (Target Date: 09/16/2014)   Baseline MET 09/22/2014   Status Achieved   PT SHORT TERM GOAL #3   Title Squat-pivot transfer w/c to level mat modified independent with buttock clearance.  (Target Date: 09/16/2014)   Baseline 09/22/2014 MET with general cues to lift buttocks more   Status Achieved   PT SHORT TERM GOAL #4   Title Sit to / from stand w/c to parallel bars with moderate assist  (Target Date:  09/16/2014)   Baseline MET 09/13/2014   Status Achieved   PT SHORT TERM GOAL #5   Title Patient ambulates 10' in parallel bars with prostheses with max assist of 1 person with 2nd person supervising for safety.  (Target Date: 09/16/2014)   Baseline MET 09/13/2014   Status Achieved   PT SHORT TERM  GOAL #6   Title Patient tolerates wear of prostheses >90% of awake hours, at dialysis may remove prostheses for comfort, with PT guidance cues. (Target Date: 10/14/2014) NEW Target Date 11/17/2014   Baseline 11/15/14: Pt. tolerates wear of prosthesis 9 hrs of 12 hr awake daily.     Status Not Met   PT SHORT TERM GOAL #7   Title Patient performs stand-pivot transfers w/c to chair with armrests with RW & prostheses with minimal assist. (Target Date: 10/14/2014)   Baseline 10/11/14: met today   Status Achieved   PT SHORT TERM GOAL #8   Title Patient ambulates 25' with RW & prostheses including turning to sit in chair with armrests with MinA. (Target Date: 10/14/2014)   Baseline 10/13/2014 MET    Status Achieved   PT SHORT TERM GOAL #9   TITLE Patient ambulates 58' with family assistance with RW & prosthesis with PT supervising / cueing for safety. (NEW Target Date 11/17/2014)   Baseline 11/15/14: per patient/daughter walking 15-20' at home.    Status Partially Met   PT SHORT TERM GOAL #10   TITLE Patient able to maintain upright wtih RW support for 2 minutes & reach 3" anteriorly and to knee level with contact assist. (NEW Target Date 11/17/2014)   Baseline on 11/15/14   Status Achieved           PT Long Term Goals - 12/13/14 1737    PT LONG TERM GOAL #1   Title Tolerates wear of prostheses >90% of awake hours (except dialysis) without change in skin integrity nor undue tenderness.  (Target Date: 12/16/2014)   Baseline 12/13/2014: pt. tolerates wear of prostheses >90% of awake hours with no change in skin integrity nor undue tenderness.     Status Achieved   PT LONG TERM GOAL #2   Title Patient  demonstrates / verbalizes proper prosthetic care for safe use of prostheses without issues.  (Target Date: 12/16/2014)   Baseline 12/13/2014: Pt. demonstated / verbalized proper prosthetic care.     Status Achieved   PT LONG TERM GOAL #3   Title Sit to /from stand and stand-pivot transfers with RW & prostheses modified independent.  (Target Date: 12/16/2014)   Baseline  12/13/2014: Pt. required min assistance for sit to/from stand and stand-pivot transfer with RW and prostheses.     Status On-going   PT LONG TERM GOAL #4   Title Standing balance with RW & prostheses manages clothes & reaches 5" with supervision.  (Target Date: 12/16/2014)   Baseline met on 10/18/14   Status Achieved   PT LONG TERM GOAL #5   Title Patient ambulates 100' with RW & prostheses with minimal assist from family safely.  (Target Date: 12/16/2014)   Baseline 12/13/2014: pt. only able to ambulate 67' with RW and prostheses with min assist from therapist provided for safety.    Status On-going   PT LONG TERM GOAL #6   Title Patient negotiates ramps & curbs with modA with RW & prostheses. (Target Date: 12/16/2014)   Status On-going               Plan - 12/13/14 1733    Clinical Impression Statement Pt. progressing toward goals able to achieve LTG # 1 and 2.  Pt. ambulated over 18ft with RW with four sitting rest breaks taken.  Additional LTG need to be addressed next session with LTG #6 not addressed due to pt. expressing too much fatigue.     Pt will benefit from  skilled therapeutic intervention in order to improve on the following deficits Abnormal gait;Decreased activity tolerance;Decreased balance;Decreased endurance;Decreased knowledge of precautions;Decreased knowledge of use of DME;Decreased mobility;Decreased range of motion;Decreased strength;Postural dysfunction;Prosthetic Dependency   Rehab Potential Good   Clinical Impairments Affecting Rehab Potential Dialysis limits strength and endurance with slow  gains.    PT Frequency 2x / week   PT Duration Other (comment)  17 weeks (4 months - 120 days)   PT Treatment/Interventions ADLs/Self Care Home Management;DME Instruction;Gait training;Functional mobility training;Therapeutic activities;Therapeutic exercise;Balance training;Neuromuscular re-education;Patient/family education;Prosthetic Training;Passive range of motion   PT Next Visit Plan Address LTG # 3, 5, and 6.  Continue with POC.   Consulted and Agree with Plan of Care Patient;Family member/caregiver   Family Member Consulted dtr        Problem List Patient Active Problem List   Diagnosis Date Noted  . Complications, amputation stump late ([place]) 05/20/2014  . Weakness generalized 04/07/2014  . Sepsis ([place]) 04/07/2014  . Decubitus ulcer, stage II 04/07/2014  . Decubitus ulcer of ankle   . Fatigue   . Wound infection ([place]) 04/06/2014  . Decubitus ulcer of sacral region, stage 2 03/07/2014  . Glaucoma 03/07/2014  . Gout 03/07/2014  . Below knee amputation status ([place]) 02/18/2014  . Osteomyelitis ([place]) 01/04/2014  . Phantom limb ([place]) 12/14/2013  . Type 1 diabetes mellitus with diabetic foot infection ([place])   . Diabetes mellitus with peripheral vascular disease ([place])   . Asthma 05/17/2013  . History of MRSA infection 04/22/2013  . Peripheral vascular disease ([place]) 11/19/2012  . End-stage renal disease on hemodialysis ([place]) 05/05/2012  . Kahler disease ([place]) 03/30/2012  . Anemia in chronic kidney disease 03/19/2012  . Hypothyroidism 03/19/2012  . Anemia, iron deficiency 03/19/2012  . Hypertension 03/18/2012  . Chronic kidney disease (CKD), stage V ([place]) 03/18/2012  . Cardiac conduction disorder 03/18/2012  . MGUS (monoclonal gammopathy of unknown significance) 02/28/2011  . Monoclonal paraproteinemia 02/28/2011    Bess Harvest 12/13/2014, 5:43 PM  Bess Harvest, [place]  Name: ADONAI SELSOR MRN: [id number] Date of Birth: [birth year]  This note has been reviewed and  edited by supervising CI.  Willow Ora, PTA, [place] [address] [place], [place] [phone] 12/14/2014, 9:51 AM

## 2014-12-15 ENCOUNTER — Ambulatory Visit: Payer: Medicare Other | Attending: Orthopedic Surgery | Admitting: Physical Therapy

## 2014-12-15 ENCOUNTER — Encounter: Payer: Self-pay | Admitting: Physical Therapy

## 2014-12-15 DIAGNOSIS — M24669 Ankylosis, unspecified knee: Secondary | ICD-10-CM | POA: Diagnosis present

## 2014-12-15 DIAGNOSIS — Z89512 Acquired absence of left leg below knee: Secondary | ICD-10-CM | POA: Diagnosis present

## 2014-12-15 DIAGNOSIS — R2689 Other abnormalities of gait and mobility: Secondary | ICD-10-CM

## 2014-12-15 DIAGNOSIS — R29818 Other symptoms and signs involving the nervous system: Secondary | ICD-10-CM | POA: Insufficient documentation

## 2014-12-15 DIAGNOSIS — M24659 Ankylosis, unspecified hip: Secondary | ICD-10-CM | POA: Insufficient documentation

## 2014-12-15 DIAGNOSIS — Z5189 Encounter for other specified aftercare: Secondary | ICD-10-CM | POA: Diagnosis present

## 2014-12-15 DIAGNOSIS — Z89511 Acquired absence of right leg below knee: Secondary | ICD-10-CM | POA: Diagnosis present

## 2014-12-15 DIAGNOSIS — R2681 Unsteadiness on feet: Secondary | ICD-10-CM | POA: Diagnosis present

## 2014-12-15 DIAGNOSIS — R269 Unspecified abnormalities of gait and mobility: Secondary | ICD-10-CM

## 2014-12-15 DIAGNOSIS — R29898 Other symptoms and signs involving the musculoskeletal system: Secondary | ICD-10-CM | POA: Diagnosis present

## 2014-12-15 DIAGNOSIS — R6889 Other general symptoms and signs: Secondary | ICD-10-CM

## 2014-12-15 DIAGNOSIS — M25669 Stiffness of unspecified knee, not elsewhere classified: Secondary | ICD-10-CM

## 2014-12-15 DIAGNOSIS — M25659 Stiffness of unspecified hip, not elsewhere classified: Secondary | ICD-10-CM

## 2014-12-15 DIAGNOSIS — Z4789 Encounter for other orthopedic aftercare: Secondary | ICD-10-CM

## 2014-12-15 NOTE — Therapy (Signed)
[facility] [address] [place], [place], [address] Phone: [phone]   Fax:  [phone]  Physical Therapy Treatment  Patient Details  Name: Jack [name] MRN: [id number] Date of Birth: Apr 03, 1945 Referring Provider: Meridee Score, MD   PHYSICAL THERAPY DISCHARGE SUMMARY  Visits from Start of Care: 29  Current functional level related to goals / functional outcomes: See Below   Remaining deficits: See Below   Education / Equipment: Prosthetic Care & HEP  Plan: Patient agrees to discharge.  Patient goals were met. Patient is being discharged due to meeting the stated rehab goals.  ?????       Encounter Date: 12/15/2014      PT End of Session - 12/15/14 1543    Visit Number 29   Number of Visits 35   Date for PT Re-Evaluation 12/17/14   Authorization Type G-Code every 10 visits   PT Start Time 0802   PT Stop Time 0845   PT Time Calculation (min) 43 min   Equipment Utilized During Treatment Gait belt   Activity Tolerance Patient tolerated treatment well;Patient limited by fatigue   Behavior During Therapy WFL for tasks assessed/performed      Past Medical History  Diagnosis Date  . Hypertension   . Pneumonia     2012  . Heart murmur   . Glaucoma   . Multiple myeloma, without mention of having achieved remission 03/30/2012    Cytogenetic neg on 03/23/2012.  . Asthma   . Hyperparathyroidism, secondary renal ([place])   . Peripheral arterial disease ([place])   . End-stage renal disease on hemodialysis [facility])     Started HD March 2014.  Cause of ESRD was DM.  Gets HD at [facility] on [place] on MWF schedule.   . Thyroid disease     hyperparathyroidism  . MRSA bacteremia   . Anemia   . Peripheral vascular disease, unspecified ([place]) 11/19/2012    In the past had R foot toe amps then R TMA. In 2015 had left foot toe amp > then TMA >then L BKA on 05/14/13   . History of MRSA infection 04/22/2013     Bacteremia assoc w L foot wound infection Mar 2015   . Gangrene of foot ([place])   . ESRD on hemodialysis ([place])   . Diabetes mellitus with peripheral vascular disease ([place])   . Hypothyroidism   . Decubitus ulcer of sacral region, stage 2 03/07/2014  . Glaucoma 03/07/2014  . Gangrene ([place])     right BKA  . Contracture of joint     left knee    Past Surgical History  Procedure Laterality Date  . Thyroidectomy    . Cervical disc surgery    . Insertion of dialysis catheter Right 03/19/2012    Procedure: INSERTION OF DIALYSIS CATHETER;  Surgeon: Mal Misty, MD;  Location: [place];  Service: Vascular;  Laterality: Right;  Right Internal Jugular  . Av fistula placement Left 03/25/2012    Procedure: ARTERIOVENOUS (AV) FISTULA CREATION;  Surgeon: Mal Misty, MD;  Location: [place];  Service: Vascular;  Laterality: Left;  . Ligation of competing branches of arteriovenous fistula Left 05/08/2012    Procedure: LIGATION OF COMPETING BRANCHES OF ARTERIOVENOUS FISTULA;  Surgeon: Mal Misty, MD;  Location: [place];  Service: Vascular;  Laterality: Left;  Ultrasound guided  . Cardiac catheterization      approx 30 years ago  . Amputation Right 11/10/2012    Procedure: AMPUTATION FIRST  and SECOND TOES Right Foot;  Surgeon: Elam Dutch, MD;  Location: [facility] OR;  Service: Vascular;  Laterality: Right;  . Transmetatarsal amputation Left 12/16/2012    Procedure: TRANSMETATARSAL AMPUTATION AND VAC PLACEMENT;  Surgeon: Elam Dutch, MD;  Location: [place];  Service: Vascular;  Laterality: Left;  . Amputation Left 04/07/2013    Procedure: AMPUTATION DIGIT- LEFT 1ST TOE;  Surgeon: Mal Misty, MD;  Location: [place];  Service: Vascular;  Laterality: Left;  . Tee without cardioversion N/A 04/20/2013    Procedure: TRANSESOPHAGEAL ECHOCARDIOGRAM (TEE);  Surgeon: Josue Hector, MD;  Location: [facility] ENDOSCOPY;  Service: Cardiovascular;  Laterality: N/A;  . I&d extremity Left 04/22/2013    Procedure: DUKGURKYHC WCB  DEBRIDEMENT LEFT FIRST TOE AMPUTATION WOUND ;  Surgeon: Mal Misty, MD;  Location: [place];  Service: Vascular;  Laterality: Left;  . Amputation Left 04/26/2013    Procedure: Left Foot Transmetatarsal Amputation;  Surgeon: Newt Minion, MD;  Location: [place];  Service: Orthopedics;  Laterality: Left;  . Toe amputation      D/C 04-30-13  . Below knee leg amputation Left 05/14/2013    DR [name]  . Amputation Left 05/14/2013    Procedure: AMPUTATION BELOW KNEE;  Surgeon: Newt Minion, MD;  Location: [place];  Service: Orthopedics;  Laterality: Left;  Left Below Knee Amputation  . Eye surgery Bilateral     CATARACTS  . Amputation Left 07/23/2013    Procedure: AMPUTATION BELOW KNEE;  Surgeon: Newt Minion, MD;  Location: [place];  Service: Orthopedics;  Laterality: Left;  Left Below Knee Amputation Revision  . Abdominal aortagram Bilateral 11/06/2012    Procedure: ABDOMINAL AORTAGRAM;  Surgeon: Elam Dutch, MD;  Location: [facility] CATH LAB;  Service: Cardiovascular;  Laterality: Bilateral;  . I&d extremity Right 01/05/2014    Procedure: IRRIGATION AND DEBRIDEMENT Right Heel Ulcer;  Surgeon: Mcarthur Rossetti, MD;  Location: [place];  Service: Orthopedics;  Laterality: Right;  Surgeon Available after 5PM  . Below knee leg amputation Right 02/18/2014    dr duda  . Amputation Right 02/18/2014    Procedure: AMPUTATION BELOW KNEE;  Surgeon: Newt Minion, MD;  Location: [place];  Service: Orthopedics;  Laterality: Right;  . Stump revision Right 05/20/2014    Procedure: Revision Right Below Knee Amputation;  Surgeon: Newt Minion, MD;  Location: [place];  Service: Orthopedics;  Laterality: Right;  . Repair quadriceps/hamstring muscles Left 05/20/2014    Procedure: Left Hamstring Release;  Surgeon: Newt Minion, MD;  Location: [place];  Service: Orthopedics;  Laterality: Left;    There were no vitals filed for this visit.  Visit Diagnosis:  Weakness of both legs  Abnormality of gait  Decreased functional  activity tolerance  Encounter for prosthetic gait training  Unsteadiness  Balance problems  Status post bilateral below knee amputation (HCC)  Activity intolerance  Decreased ROM of lower extremity  Status post below knee amputation of left lower extremity (HCC)  Decreased range of knee movement, unspecified laterality  Decreased range of hip movement, unspecified laterality          [facility] PT Assessment - 12/15/14 0858    Observation/Other Assessments   Focus on Therapeutic Outcomes (FOTO)  46.22 Functional Status  Initial was 14.73 FS   Fear Avoidance Belief Questionnaire (FABQ)  26 (8)  Initial was 60 (16)         Prosthetics Assessment - 12/15/14 0858    Prosthetics  Prosthetic Care Independent with Skin check;Residual limb care;Prosthetic cleaning;Ply sock cleaning;Correct ply sock adjustment;Proper wear schedule/adjustment;Other (comment)  weighing & dry wt adjustments with dialysis                    [facility] Adult PT Treatment/Exercise - 12/15/14 0858    Transfers   Transfers Sit to Stand;Stand to Sit;Stand Pivot Transfers   Sit to Stand 6: Modified independent (Device/Increase time);With upper extremity assist;With armrests;From chair/3-in-1   Sit to Stand Details --   Sit to Stand Details (indicate cue type and reason) --   Stand to Sit 6: Modified independent (Device/Increase time);With upper extremity assist;With armrests;To chair/3-in-1   Stand to Sit Details (indicate cue type and reason) --   Stand to Sit Details --   Stand Pivot Transfers 6: Modified independent (Device/Increase time);With armrests  with RW & prostheses   Ambulation/Gait   Ambulation/Gait Yes   Ambulation/Gait Assistance 5: Supervision   Ambulation Distance (Feet) 100 Feet   Assistive device Prostheses;Rolling walker   Gait Pattern Step-through pattern;Decreased stride length;Decreased step length - right;Decreased step length - left;Right flexed knee in stance;Left  flexed knee in stance;Trunk flexed   Ambulation Surface Indoor;Level   Stairs No   Ramp 4: Min assist  RW & prostheses   Ramp Details (indicate cue type and reason) Pt. required occasional verbal cues for wt. shift and forward hand positioning on RW with ascending ramp; verbal cues provided for posteriorly positioned hand placement on RW with descending ramp.    Curb 4: Min assist;3: Mod assist  Prostheses & RW   Curb Details (indicate cue type and reason) Pt. required min assist with RW for descending curb with verbal and tactile cues for proper technique; pt. required mod assist for ascending curb with verbal and tactile cues for R LE advancement up curb and wt. shifting.     Prosthetics   Current prosthetic wear tolerance (days/week)  7 days /wk   Current prosthetic wear tolerance (#hours/day)  reports wearing all awake hours when out of bed except dialysis or bathing.    Edema none noted   Residual limb condition  intact with no issues  Pt. self-reported no issues no visual inspection performed.    Education Provided --   [employer]) Educated --   Education Method --   Education Method --                PT Education - 12/15/14 0800    Education provided Yes   Education Details proper assistance on ramp & curb   Person(s) Educated Patient;Child(ren)   Methods Explanation;Demonstration;Verbal cues   Comprehension Verbalized understanding;Returned demonstration;Verbal cues required          PT Short Term Goals - 11/23/14 1440    PT SHORT TERM GOAL #1   Title Patient donnes prostheses with cues only. (Target Date: 09/16/2014)   Baseline MET 09/22/2014   Status Achieved   PT SHORT TERM GOAL #2   Title Patient tolerates bilateral prostheses wear >6hrs total per day without skin issues or limb pain.  (Target Date: 09/16/2014)   Baseline MET 09/22/2014   Status Achieved   PT SHORT TERM GOAL #3   Title Squat-pivot transfer w/c to level mat modified independent with buttock  clearance.  (Target Date: 09/16/2014)   Baseline 09/22/2014 MET with general cues to lift buttocks more   Status Achieved   PT SHORT TERM GOAL #4   Title Sit to / from stand w/c to parallel bars  with moderate assist  (Target Date: 09/16/2014)   Baseline MET 09/13/2014   Status Achieved   PT SHORT TERM GOAL #5   Title Patient ambulates 10' in parallel bars with prostheses with max assist of 1 person with 2nd person supervising for safety.  (Target Date: 09/16/2014)   Baseline MET 09/13/2014   Status Achieved   PT SHORT TERM GOAL #6   Title Patient tolerates wear of prostheses >90% of awake hours, at dialysis may remove prostheses for comfort, with PT guidance cues. (Target Date: 10/14/2014) NEW Target Date 11/17/2014   Baseline 11/15/14: Pt. tolerates wear of prosthesis 9 hrs of 12 hr awake daily.     Status Not Met   PT SHORT TERM GOAL #7   Title Patient performs stand-pivot transfers w/c to chair with armrests with RW & prostheses with minimal assist. (Target Date: 10/14/2014)   Baseline 10/11/14: met today   Status Achieved   PT SHORT TERM GOAL #8   Title Patient ambulates 25' with RW & prostheses including turning to sit in chair with armrests with MinA. (Target Date: 10/14/2014)   Baseline 10/13/2014 MET    Status Achieved   PT SHORT TERM GOAL #9   TITLE Patient ambulates 75' with family assistance with RW & prosthesis with PT supervising / cueing for safety. (NEW Target Date 11/17/2014)   Baseline 11/15/14: per patient/daughter walking 15-20' at home.    Status Partially Met   PT SHORT TERM GOAL #10   TITLE Patient able to maintain upright wtih RW support for 2 minutes & reach 3" anteriorly and to knee level with contact assist. (NEW Target Date 11/17/2014)   Baseline on 11/15/14   Status Achieved           PT Long Term Goals - 12/15/14 1534    PT LONG TERM GOAL #1   Title Tolerates wear of prostheses >90% of awake hours (except dialysis) without change in skin integrity nor undue tenderness.   (Target Date: 12/16/2014)   Baseline 12/13/2014: pt. tolerates wear of prostheses >90% of awake hours with no change in skin integrity nor undue tenderness.     Status Achieved   PT LONG TERM GOAL #2   Title Patient demonstrates / verbalizes proper prosthetic care for safe use of prostheses without issues.  (Target Date: 12/16/2014)   Baseline 12/13/2014: Pt. demonstated / verbalized proper prosthetic care.     Status Achieved   PT LONG TERM GOAL #3   Title Sit to /from stand and stand-pivot transfers with RW & prostheses modified independent.  (Target Date: 12/16/2014)   Baseline MET 12/15/2014   Status Achieved   PT LONG TERM GOAL #4   Title Standing balance with RW & prostheses manages clothes & reaches 5" with supervision.  (Target Date: 12/16/2014)   Baseline met on 10/18/14   Status Achieved   PT LONG TERM GOAL #5   Title Patient ambulates 100' with RW & prostheses with minimal assist from family safely.  (Target Date: 12/16/2014)   Baseline 12/15/2014:  pt. albe to ambulate 100' with RW and prostheses with supervision from therapist provided for safety.    Status Achieved   PT LONG TERM GOAL #6   Title Patient negotiates ramps & curbs with modA with RW & prostheses. (Target Date: 12/16/2014)   Baseline 12/15/2014: Pt. able to negotiate ramps & curbs with min A / mod A with RW & prostheses.    Status Achieved  Plan - 01-11-2015 1543    Clinical Impression Statement Patient met all LTGs. He is functioning wtih prostheses at household & basic community level with family assistance. Patient is safely wearing & using prostheses. Patient self-reported improved functional status from 14.7% to 46.2% and decreased fear from 47% to 26%. Patient should continue to progress fitness including strength, flexibility, balance & endurance then if he wants to progress mobility further, he should be referred back to PT in 4-6 months.     Pt will benefit from skilled therapeutic intervention  in order to improve on the following deficits Abnormal gait;Decreased activity tolerance;Decreased balance;Decreased endurance;Decreased knowledge of precautions;Decreased knowledge of use of DME;Decreased mobility;Decreased range of motion;Decreased strength;Postural dysfunction;Prosthetic Dependency   Rehab Potential Good   Clinical Impairments Affecting Rehab Potential Dialysis limits strength and endurance with slow gains.    PT Frequency 2x / week   PT Duration Other (comment)  17 weeks (4 months - 120 days)   PT Treatment/Interventions ADLs/Self Care Home Management;DME Instruction;Gait training;Functional mobility training;Therapeutic activities;Therapeutic exercise;Balance training;Neuromuscular re-education;Patient/family education;Prosthetic Training;Passive range of motion   PT Next Visit Plan Discharge PT   Consulted and Agree with Plan of Care Patient;Family member/caregiver   Family Member Consulted dtr          G-Codes - 01/11/2015 1610    Functional Assessment Tool Used Patient wearing prostheses all awake hours except bathing & dialysis. He verbalizes how to wear / weigh prostheses at dialysis if he decides to wear them. He verbalizes proper prosthetic care.    Functional Limitation Self care   Self Care Goal Status [id number]) At least 1 percent but less than 20 percent impaired, limited or restricted   Self Care Discharge Status [id number]) At least 1 percent but less than 20 percent impaired, limited or restricted      Problem List Patient Active Problem List   Diagnosis Date Noted  . Complications, amputation stump late ([place]) 05/20/2014  . Weakness generalized 04/07/2014  . Sepsis ([place]) 04/07/2014  . Decubitus ulcer, stage II 04/07/2014  . Decubitus ulcer of ankle   . Fatigue   . Wound infection ([place]) 04/06/2014  . Decubitus ulcer of sacral region, stage 2 03/07/2014  . Glaucoma 03/07/2014  . Gout 03/07/2014  . Below knee amputation status ([place]) 02/18/2014  .  Osteomyelitis ([place]) 01/04/2014  . Phantom limb ([place]) 01-10-2014  . Type 1 diabetes mellitus with diabetic foot infection ([place])   . Diabetes mellitus with peripheral vascular disease ([place])   . Asthma 05/17/2013  . History of MRSA infection 04/22/2013  . Peripheral vascular disease ([place]) 11/19/2012  . End-stage renal disease on hemodialysis ([place]) 05/05/2012  . Kahler disease ([place]) 03/30/2012  . Anemia in chronic kidney disease 03/19/2012  . Hypothyroidism 03/19/2012  . Anemia, iron deficiency 03/19/2012  . Hypertension 03/18/2012  . Chronic kidney disease (CKD), stage V ([place]) 03/18/2012  . Cardiac conduction disorder 03/18/2012  . MGUS (monoclonal gammopathy of unknown significance) 02/28/2011  . Monoclonal paraproteinemia 02/28/2011    Jamey Reas PT, DPT 01-11-15, 9:16 PM  [place], [place]  Name: WAHID HOLLEY MRN: [id number] Date of Birth: 06/08/1945

## 2015-02-14 ENCOUNTER — Ambulatory Visit: Payer: Medicare Other | Attending: Orthopedic Surgery | Admitting: Physical Therapy

## 2015-02-14 ENCOUNTER — Encounter: Payer: Self-pay | Admitting: Physical Therapy

## 2015-02-14 DIAGNOSIS — R29898 Other symptoms and signs involving the musculoskeletal system: Secondary | ICD-10-CM | POA: Diagnosis not present

## 2015-02-14 DIAGNOSIS — Z89511 Acquired absence of right leg below knee: Secondary | ICD-10-CM | POA: Diagnosis present

## 2015-02-14 DIAGNOSIS — M25669 Stiffness of unspecified knee, not elsewhere classified: Secondary | ICD-10-CM

## 2015-02-14 DIAGNOSIS — M25659 Stiffness of unspecified hip, not elsewhere classified: Secondary | ICD-10-CM

## 2015-02-14 DIAGNOSIS — M24669 Ankylosis, unspecified knee: Secondary | ICD-10-CM | POA: Diagnosis present

## 2015-02-14 DIAGNOSIS — M24659 Ankylosis, unspecified hip: Secondary | ICD-10-CM | POA: Diagnosis present

## 2015-02-14 DIAGNOSIS — R6889 Other general symptoms and signs: Secondary | ICD-10-CM | POA: Insufficient documentation

## 2015-02-14 DIAGNOSIS — Z5189 Encounter for other specified aftercare: Secondary | ICD-10-CM | POA: Diagnosis present

## 2015-02-14 DIAGNOSIS — Z4789 Encounter for other orthopedic aftercare: Secondary | ICD-10-CM

## 2015-02-14 DIAGNOSIS — R2689 Other abnormalities of gait and mobility: Secondary | ICD-10-CM

## 2015-02-14 DIAGNOSIS — Z89512 Acquired absence of left leg below knee: Secondary | ICD-10-CM | POA: Diagnosis present

## 2015-02-14 DIAGNOSIS — R29818 Other symptoms and signs involving the nervous system: Secondary | ICD-10-CM | POA: Diagnosis present

## 2015-02-14 DIAGNOSIS — R269 Unspecified abnormalities of gait and mobility: Secondary | ICD-10-CM | POA: Diagnosis present

## 2015-02-14 DIAGNOSIS — R2681 Unsteadiness on feet: Secondary | ICD-10-CM | POA: Insufficient documentation

## 2015-02-14 NOTE — Therapy (Signed)
[facility] [address] [place], [place], [address] Phone: [phone]   Fax:  [phone]  Physical Therapy Evaluation  Patient Details  Name: Jack [name] MRN: [id number] Date of Birth: January 18, 1945 Referring Provider: Meridee Score, MD  Encounter Date: 02/14/2015      PT End of Session - 02/14/15 0845    Visit Number 1   Number of Visits 17   Date for PT Re-Evaluation 04/13/15   Authorization Type G-code & progress note every 10 visits   PT Start Time 0800   PT Stop Time 0843   PT Time Calculation (min) 43 min   Equipment Utilized During Treatment Gait belt   Activity Tolerance Patient tolerated treatment well;Patient limited by fatigue   Behavior During Therapy [facility] for tasks assessed/performed      Past Medical History  Diagnosis Date  . Hypertension   . Pneumonia     2012  . Heart murmur   . Glaucoma   . Multiple myeloma, without mention of having achieved remission 03/30/2012    Cytogenetic neg on 03/23/2012.  . Asthma   . Hyperparathyroidism, secondary renal ([place])   . Peripheral arterial disease ([place])   . End-stage renal disease on hemodialysis [facility])     Started HD March 2014.  Cause of ESRD was DM.  Gets HD at [facility] on [address] on MWF schedule.   . Thyroid disease     hyperparathyroidism  . MRSA bacteremia   . Anemia   . Peripheral vascular disease, unspecified ([place]) 11/19/2012    In the past had R foot toe amps then R TMA. In 2015 had left foot toe amp > then TMA >then L BKA on 05/14/13   . History of MRSA infection 04/22/2013    Bacteremia assoc w L foot wound infection Mar 2015   . Gangrene of foot ([place])   . ESRD on hemodialysis ([place])   . Diabetes mellitus with peripheral vascular disease ([place])   . Hypothyroidism   . Decubitus ulcer of sacral region, stage 2 03/07/2014  . Glaucoma 03/07/2014  . Gangrene ([place])     right BKA  . Contracture of joint     left knee    Past Surgical  History  Procedure Laterality Date  . Thyroidectomy    . Cervical disc surgery    . Insertion of dialysis catheter Right 03/19/2012    Procedure: INSERTION OF DIALYSIS CATHETER;  Surgeon: Mal Misty, MD;  Location: [place];  Service: Vascular;  Laterality: Right;  Right Internal Jugular  . Av fistula placement Left 03/25/2012    Procedure: ARTERIOVENOUS (AV) FISTULA CREATION;  Surgeon: Mal Misty, MD;  Location: [place];  Service: Vascular;  Laterality: Left;  . Ligation of competing branches of arteriovenous fistula Left 05/08/2012    Procedure: LIGATION OF COMPETING BRANCHES OF ARTERIOVENOUS FISTULA;  Surgeon: Mal Misty, MD;  Location: [place];  Service: Vascular;  Laterality: Left;  Ultrasound guided  . Cardiac catheterization      approx 30 years ago  . Amputation Right 11/10/2012    Procedure: AMPUTATION FIRST and SECOND TOES Right Foot;  Surgeon: Elam Dutch, MD;  Location: [facility] OR;  Service: Vascular;  Laterality: Right;  . Transmetatarsal amputation Left 12/16/2012    Procedure: TRANSMETATARSAL AMPUTATION AND VAC PLACEMENT;  Surgeon: Elam Dutch, MD;  Location: [place];  Service: Vascular;  Laterality: Left;  . Amputation Left 04/07/2013    Procedure: AMPUTATION DIGIT- LEFT 1ST TOE;  Surgeon: Mal Misty, MD;  Location: [place];  Service: Vascular;  Laterality: Left;  . Tee without cardioversion N/A 04/20/2013    Procedure: TRANSESOPHAGEAL ECHOCARDIOGRAM (TEE);  Surgeon: Josue Hector, MD;  Location: [facility] ENDOSCOPY;  Service: Cardiovascular;  Laterality: N/A;  . I&d extremity Left 04/22/2013    Procedure: [id number] TDV DEBRIDEMENT LEFT FIRST TOE AMPUTATION WOUND ;  Surgeon: Mal Misty, MD;  Location: [place];  Service: Vascular;  Laterality: Left;  . Amputation Left 04/26/2013    Procedure: Left Foot Transmetatarsal Amputation;  Surgeon: Newt Minion, MD;  Location: [place];  Service: Orthopedics;  Laterality: Left;  . Toe amputation      D/C 04-30-13  . Below knee leg amputation  Left 05/14/2013    DR [name]  . Amputation Left 05/14/2013    Procedure: AMPUTATION BELOW KNEE;  Surgeon: Newt Minion, MD;  Location: [place];  Service: Orthopedics;  Laterality: Left;  Left Below Knee Amputation  . Eye surgery Bilateral     CATARACTS  . Amputation Left 07/23/2013    Procedure: AMPUTATION BELOW KNEE;  Surgeon: Newt Minion, MD;  Location: [place];  Service: Orthopedics;  Laterality: Left;  Left Below Knee Amputation Revision  . Abdominal aortagram Bilateral 11/06/2012    Procedure: ABDOMINAL AORTAGRAM;  Surgeon: Elam Dutch, MD;  Location[facility] CATH LAB;  Service: Cardiovascular;  Laterality: Bilateral;  . I&d extremity Right 01/05/2014    Procedure: IRRIGATION AND DEBRIDEMENT Right Heel Ulcer;  Surgeon: Mcarthur Rossetti, MD;  Location: [place];  Service: Orthopedics;  Laterality: Right;  Surgeon Available after 5PM  . Below knee leg amputation Right 02/18/2014    dr duda  . Amputation Right 02/18/2014    Procedure: AMPUTATION BELOW KNEE;  Surgeon: Newt Minion, MD;  Location: [place];  Service: Orthopedics;  Laterality: Right;  . Stump revision Right 05/20/2014    Procedure: Revision Right Below Knee Amputation;  Surgeon: Newt Minion, MD;  Location: [place];  Service: Orthopedics;  Laterality: Right;  . Repair quadriceps/hamstring muscles Left 05/20/2014    Procedure: Left Hamstring Release;  Surgeon: Newt Minion, MD;  Location: [place];  Service: Orthopedics;  Laterality: Left;    There were no vitals filed for this visit.  Visit Diagnosis:  Weakness of both legs  Abnormality of gait  Decreased functional activity tolerance  Encounter for prosthetic gait training  Unsteadiness  Balance problems  Status post bilateral below knee amputation (HCC)  Decreased ROM of lower extremity  Decreased range of knee movement, unspecified laterality  Decreased range of hip movement, unspecified laterality      Subjective Assessment - 02/14/15 0806    Subjective Patient  reports 2 falls into bed when trying to turn to get into position. He is wearing prostheses most of awake hours including to dialysis. He was referred to PT to improve strength & mobility.    Pertinent History ESRD, Bil. Transtibial Amputations, HTN, Type 1 DM, asthma, multiple myelomas, hyperparathyroidism, glaucoma, gout, PVD   Patient Stated Goals He wants to walk more and get out of w/c.    Currently in Pain? Yes   Pain Score --  in Last week, worst 8/10, best 0/10   Pain Location Hand   Pain Orientation Right   Pain Descriptors / Indicators Tightness   Pain Type Chronic pain  arthritis    Pain Onset More than a month ago   Pain Frequency Intermittent   Aggravating Factors  overactivity, increases  with weather changes   Pain Relieving Factors medications   Multiple Pain Sites No            [facility] PT Assessment - 02/14/15 0800    Assessment   Medical Diagnosis Bilateral Transtibial Amputations   Referring Provider Meridee Score, MD   Onset Date/Surgical Date 08/11/14   Prior Therapy 08/18/2014 - 12/15/2014 29 PT visits  went from non-ambulatory to limited household   Precautions   Precautions Fall   Precaution Comments NO BP Left UE   Required Braces or Orthoses --  Bil. prostheses   Restrictions   Weight Bearing Restrictions No   Balance Screen   Has the patient fallen in the past 6 months Yes   How many times? 2   Has the patient had a decrease in activity level because of a fear of falling?  Yes   Is the patient reluctant to leave their home because of a fear of falling?  Yes   [place];Other relatives   Type of Home Apartment   Home Access Level entry   Home Layout One level   [place] - single point;Walker - 2 wheels;Crutches;Bedside commode;Shower seat;Wheelchair - manual   Prior Function   Level of Independence Independent;Independent with household mobility with  device;Independent with gait;Independent with transfers   Observation/Other Assessments   Focus on Therapeutic Outcomes (FOTO)  47.15 Functional Status   Fear Avoidance Belief Questionnaire (FABQ)  54 (15/24)   Posture/Postural Control   Posture/Postural Control Postural limitations   Postural Limitations Rounded Shoulders;Forward head;Increased lumbar lordosis;Flexed trunk   ROM / Strength   AROM / PROM / Strength PROM;Strength   PROM   Overall PROM  Deficits   Right/Left Hip Right;Left   Left Hip Extension -26  Thomas Position   Right/Left Knee Right;Left   Right Knee Extension -20  supine   Left Knee Extension -36  supine   Strength   Overall Strength Comments UEs grossly 5/5, WFL for tasks performed   Right Hip Flexion 3+/5   Right Hip Extension 2+/5   Right Hip ABduction 3-/5   Left Hip Flexion 3+/5   Left Hip Extension 2+/5   Left Hip ABduction 3-/5   Right Knee Flexion 3-/5   Right Knee Extension 3+/5   Left Knee Flexion 3-/5   Left Knee Extension 3+/5   Right Hip   Right Hip Extension -33  Thomas position   Bed Mobility   Bed Mobility Supine to Sit   Supine to Sit 5: Supervision   Transfers   Transfers Sit to Stand;Stand to Sit;Squat Pivot Transfers   Sit to Stand 6: Modified independent (Device/Increase time);With upper extremity assist;With armrests;From chair/3-in-1;From elevated surface  from 20" w/c to RW for stabilization   Stand to Sit 6: Modified independent (Device/Increase time);With upper extremity assist;With armrests;To chair/3-in-1;To elevated surface  From RW to 20" w/c   Squat Pivot Transfers 6: Modified independent (Device/Increase time);With upper extremity assistance  over armrests using prostheses   Ambulation/Gait   Ambulation/Gait Yes   Ambulation/Gait Assistance 5: Supervision   Ambulation Distance (Feet) 50 Feet   Assistive device Prostheses;Rolling walker   Gait Pattern Step-through pattern;Decreased stride length;Decreased step  length - right;Decreased step length - left;Right flexed knee in stance;Left flexed knee in stance;Trunk flexed   Ambulation Surface Indoor;Level   Gait velocity 0.76 ft/sec  <1.8 ft/sec indicates fall risk   Static Standing Balance   Static Standing -  Balance Support Bilateral upper extremity supported;During functional activity  RW support   Static Standing - Level of Assistance 5: Stand by assistance   Static Standing - Comment/# of Minutes 2 minutes   Dynamic Standing Balance   Dynamic Standing - Balance Support Right upper extremity supported;Left upper extremity supported;During functional activity  RW support    Dynamic Standing - Level of Assistance 5: Stand by assistance   Dynamic Standing - Balance Activities Reaching for objects  raises UE to touch PT shoulder length of his arm no displace   Standardized Balance Assessment   Standardized Balance Assessment Timed Up and Go Test   Timed Up and Go Test   Normal TUG (seconds) 56.38  RW with contact assist in turns,    TUG Comments >30 seconds indicates dependent in ADLs, >13.5sec indicates fall risk         Prosthetics Assessment - 02/14/15 0800    Prosthetics   Prosthetic Care Independent with Skin check;Residual limb care;Prosthetic cleaning;Ply sock cleaning;Correct ply sock adjustment;Proper wear schedule/adjustment;Proper weight-bearing schedule/adjustment   Donning prosthesis  Modified independent (Device/Increase time)  he can donne with increased time but let's dtr do it for spe   Doffing prosthesis  Modified independent (Device/Increase time)   Current prosthetic wear tolerance (days/week)  7 days /wk   Current prosthetic wear tolerance (#hours/day)  reports wearing all awake hours when out of bed except dialysis or bathing.    Current prosthetic weight-bearing tolerance (hours/day)  5 minutes of balance /gait before LEs fatigued but no pain noted.    Edema none   Residual limb condition  normal color, temperature,  moisture with no open areas                            PT Short Term Goals - 02/14/15 0845    PT SHORT TERM GOAL #1   Title Patient verbalizes understanding of updated HEP to progress activity tolerance. (Target Date: 03/16/2015)   Time 4   Period Weeks   Status New   PT SHORT TERM GOAL #2   Title Patient ambulates 100' with RW & prostheses with supervision.  (Target Date: 03/16/2015)   Time 4   Period Weeks   Status New   PT SHORT TERM GOAL #3   Title Patient negotiates ramps & curbs with rolling walker & prostheses with moderate assist.  (Target Date: 03/16/2015)   Time 4   Period Weeks   Status New   PT SHORT TERM GOAL #4   Title Patient reaches 4" beyond length of his arm anteriorly and to ankle level towards ground with RW support with supervision.  (Target Date: 03/16/2015)   Time 4   Period Weeks   Status New           PT Long Term Goals - 02/14/15 0845    PT LONG TERM GOAL #1   Title Patient verbalizes understanding of ongoing HEP including [employer] to progress strength, flexibility, balance & endurance.  (Target Date: 03/16/2015)   Time 8   Period Weeks   Status New   PT LONG TERM GOAL #2   Title TImed Up & Go with rolling walker <45 seconds modified independent to indicate lower fall risk.  (Target Date: 03/16/2015)   Time 8   Period Weeks   Status New   PT LONG TERM GOAL #3   Title Patient ambulates 125' with RW & prostheses including negotiating around obstacles for limited community  mobility.  (Target Date: 03/16/2015)   Time 8   Period Weeks   Status New   PT LONG TERM GOAL #4   Title Patient negotiates ramps & curbs with RW and stairs with 2 rails with minimal assist from family to enable community access.  (Target Date: 03/16/2015)   Time 8   Period Weeks   Status New   PT LONG TERM GOAL #5   Title Patient reaches 5" all directions and to floor with rolling walker support safely modified independent.  (Target Date: 03/16/2015)   Time 8   Period  Weeks   Status New               Plan - 02-Mar-2015 1148    Clinical Impression Statement Patient appears ready to progress his mobility from limited household with family to modified independent household & limited community with family. He has weakness in LEs but are stronger than when PT discharged last episode of care. He has decrased ROM of hips & knees with no increases in contractures since last episode of care. He has impaired balance with limited ability to reach forward & to ground. Patient's gait is dependent on supervision & limited to distance of 50' due to muscle fatigue. Timed Up-Go of 56.38sec indicates dependent in ADLs & fall risk. Patient needs contact assist in turns due to instability. Patient's requires skilled PT to progress mobility to next level which is his goal.     Pt will benefit from skilled therapeutic intervention in order to improve on the following deficits Abnormal gait;Decreased activity tolerance;Decreased balance;Decreased endurance;Decreased knowledge of use of DME;Decreased mobility;Decreased range of motion;Decreased strength;Prosthetic Dependency   Rehab Potential Good   Clinical Impairments Affecting Rehab Potential Dialysis limits strength and endurance with slow gains.    PT Frequency 2x / week   PT Duration 8 weeks   PT Treatment/Interventions ADLs/Self Care Home Management;DME Instruction;Gait training;Functional mobility training;Therapeutic activities;Therapeutic exercise;Balance training;Neuromuscular re-education;Patient/family education;Prosthetic Training;Passive range of motion;Stair training   PT Next Visit Plan progressive gait with instruction to increase frequency of gait with family to initiate improving activity tolerance   Recommended Other Services Silver Sneakers for [employer] initially with recumbent stepper to augment PT   Consulted and Agree with Plan of Care Patient   Family Member Consulted Babette Relic  - Mar 02, 2015 0845    Functional Assessment Tool Used Timed Up & Go 56.38sec with rolling walker & prostheses with minimal guard in turns.    Functional Limitation Mobility: Walking and moving around   Mobility: Walking and Moving Around Current Status [phone]) At least 80 percent but less than 100 percent impaired, limited or restricted   Mobility: Walking and Moving Around Goal Status [phone]) At least 60 percent but less than 80 percent impaired, limited or restricted       Problem List Patient Active Problem List   Diagnosis Date Noted  . Complications, amputation stump late ([place]) 05/20/2014  . Weakness generalized 04/07/2014  . Sepsis ([place]) 04/07/2014  . Decubitus ulcer, stage II 04/07/2014  . Decubitus ulcer of ankle   . Fatigue   . Wound infection ([place]) 04/06/2014  . Decubitus ulcer of sacral region, stage 2 03/07/2014  . Glaucoma 03/07/2014  . Gout 03/07/2014  . Below knee amputation status ([place]) 02/18/2014  . Osteomyelitis ([place]) 01/04/2014  . Phantom limb ([place]) 12/14/2013  . Type 1 diabetes mellitus with diabetic foot infection ([place])   . Diabetes mellitus  with peripheral vascular disease ([place])   . Asthma 05/17/2013  . History of MRSA infection 04/22/2013  . Peripheral vascular disease ([place]) 11/19/2012  . End-stage renal disease on hemodialysis ([place]) 05/05/2012  . Kahler disease ([place]) 03/30/2012  . Anemia in chronic kidney disease 03/19/2012  . Hypothyroidism 03/19/2012  . Anemia, iron deficiency 03/19/2012  . Hypertension 03/18/2012  . Chronic kidney disease (CKD), stage V ([place]) 03/18/2012  . Cardiac conduction disorder 03/18/2012  . MGUS (monoclonal gammopathy of unknown significance) 02/28/2011  . Monoclonal paraproteinemia 02/28/2011    Jamey Reas PT, DPT 02/14/2015, 1:06 PM  [facility] [address] [place], [place], [address] Phone: [phone]   Fax:  [phone]  Name: Jack [name] MRN:  [id number] Date of Birth: 06/15/1945

## 2015-02-20 ENCOUNTER — Emergency Department (HOSPITAL_COMMUNITY)
Admission: EM | Admit: 2015-02-20 | Discharge: 2015-02-20 | Disposition: A | Discharge status: Home / Self Care | Payer: Medicare Other | Attending: Emergency Medicine | Admitting: Emergency Medicine

## 2015-02-20 ENCOUNTER — Encounter (HOSPITAL_COMMUNITY): Payer: Self-pay | Admitting: Cardiology

## 2015-02-20 DIAGNOSIS — N186 End stage renal disease: Secondary | ICD-10-CM | POA: Diagnosis not present

## 2015-02-20 DIAGNOSIS — J45909 Unspecified asthma, uncomplicated: Secondary | ICD-10-CM | POA: Insufficient documentation

## 2015-02-20 DIAGNOSIS — L509 Urticaria, unspecified: Secondary | ICD-10-CM | POA: Insufficient documentation

## 2015-02-20 DIAGNOSIS — I120 Hypertensive chronic kidney disease with stage 5 chronic kidney disease or end stage renal disease: Secondary | ICD-10-CM | POA: Insufficient documentation

## 2015-02-20 DIAGNOSIS — Z87891 Personal history of nicotine dependence: Secondary | ICD-10-CM | POA: Diagnosis not present

## 2015-02-20 DIAGNOSIS — Z79899 Other long term (current) drug therapy: Secondary | ICD-10-CM | POA: Insufficient documentation

## 2015-02-20 DIAGNOSIS — R011 Cardiac murmur, unspecified: Secondary | ICD-10-CM | POA: Insufficient documentation

## 2015-02-20 DIAGNOSIS — L298 Other pruritus: Secondary | ICD-10-CM | POA: Diagnosis present

## 2015-02-20 DIAGNOSIS — Z992 Dependence on renal dialysis: Secondary | ICD-10-CM | POA: Insufficient documentation

## 2015-02-20 DIAGNOSIS — E119 Type 2 diabetes mellitus without complications: Secondary | ICD-10-CM | POA: Diagnosis not present

## 2015-02-20 DIAGNOSIS — Z8614 Personal history of Methicillin resistant Staphylococcus aureus infection: Secondary | ICD-10-CM | POA: Diagnosis not present

## 2015-02-20 DIAGNOSIS — Z8701 Personal history of pneumonia (recurrent): Secondary | ICD-10-CM | POA: Diagnosis not present

## 2015-02-20 DIAGNOSIS — E039 Hypothyroidism, unspecified: Secondary | ICD-10-CM | POA: Diagnosis not present

## 2015-02-20 DIAGNOSIS — Z7982 Long term (current) use of aspirin: Secondary | ICD-10-CM | POA: Insufficient documentation

## 2015-02-20 MED ORDER — PREDNISONE 20 MG PO TABS: 40.0000 mg | ORAL_TABLET | Freq: Every day | ORAL | Status: DC

## 2015-02-20 MED ORDER — PREDNISONE 20 MG PO TABS
40.0000 mg | ORAL_TABLET | Freq: Once | ORAL | Status: AC
  Administered 2015-02-20: 40 mg via ORAL
  Filled 2015-02-20: qty 2

## 2015-02-20 NOTE — Discharge Instructions (Signed)
Hives Hives are itchy, red, swollen areas of the skin. They can vary in size and location on your body. Hives can come and go for hours or several days (acute hives) or for several weeks (chronic hives). Hives do not spread from person to person (noncontagious). They may get worse with scratching, exercise, and emotional stress. CAUSES   Allergic reaction to food, additives, or drugs.  Infections, including the common cold.  Illness, such as vasculitis, lupus, or thyroid disease.  Exposure to sunlight, heat, or cold.  Exercise.  Stress.  Contact with chemicals. SYMPTOMS   Red or white swollen patches on the skin. The patches may change size, shape, and location quickly and repeatedly.  Itching.  Swelling of the hands, feet, and face. This may occur if hives develop deeper in the skin. DIAGNOSIS  Your caregiver can usually tell what is wrong by performing a physical exam. Skin or blood tests may also be done to determine the cause of your hives. In some cases, the cause cannot be determined. TREATMENT  Mild cases usually get better with medicines such as antihistamines. Severe cases may require an emergency epinephrine injection. If the cause of your hives is known, treatment includes avoiding that trigger.  HOME CARE INSTRUCTIONS   Avoid causes that trigger your hives.  Take antihistamines as directed by your caregiver to reduce the severity of your hives. Non-sedating or low-sedating antihistamines are usually recommended. Do not drive while taking an antihistamine.  Take any other medicines prescribed for itching as directed by your caregiver.  Wear loose-fitting clothing.  Keep all follow-up appointments as directed by your caregiver. SEEK MEDICAL CARE IF:   You have persistent or severe itching that is not relieved with medicine.  You have painful or swollen joints. SEEK IMMEDIATE MEDICAL CARE IF:   You have a fever.  Your tongue or lips are swollen.  You have  trouble breathing or swallowing.  You feel tightness in the throat or chest.  You have abdominal pain. These problems may be the first sign of a life-threatening allergic reaction. Call your local emergency services (911 in [place] MAKE SURE YOU:   Understand these instructions.  Will watch your condition.  Will get help right away if you are not doing well or get worse.   This information is not intended to replace advice given to you by your health care provider. Make sure you discuss any questions you have with your health care provider.   Document Released: 12/31/2004 Document Revised: 01/05/2013 Document Reviewed: 03/26/2011 Elsevier Interactive Patient Education 2016 [employer] Inc[employer]  

## 2015-02-20 NOTE — ED Notes (Signed)
Pt reports he started noticing itching on Friday and then developing swelling in his face. Also reports a rash to his whole body. Denies any pain.

## 2015-02-20 NOTE — ED Provider Notes (Signed)
CSN: [id number]     Arrival date & time 02/20/15  0727 History   First MD Initiated Contact with Patient 02/20/15 0802     Chief Complaint  Patient presents with  . Allergic Reaction  . Pruritis     (Consider location/radiation/quality/duration/timing/severity/associated sxs/prior Treatment) HPI   70 year old male with pruritic rash. Onset on Friday. Persistent since then. First noticed on his face. Since generalized. Denies any dyspnea, wheezing or other respiratory complaints. No dizziness or lightheadedness. No abdominal pain, nausea, diarrhea or other GI complaints. Only different exposure recently that he can identify is having thousand island dressing on a salad on Friday and Saturday. Reports that has previously had itching in his hands after eating tomatoes. Has been taking benadryl w/o much relief.   Past Medical History  Diagnosis Date  . Hypertension   . Pneumonia     2012  . Heart murmur   . Glaucoma   . Multiple myeloma, without mention of having achieved remission 03/30/2012    Cytogenetic neg on 03/23/2012.  . Asthma   . Hyperparathyroidism, secondary renal ([place])   . Peripheral arterial disease ([place])   . End-stage renal disease on hemodialysis [facility])     Started HD March 2014.  Cause of ESRD was DM.  Gets HD at [facility] on [address] on MWF schedule.   . Thyroid disease     hyperparathyroidism  . MRSA bacteremia   . Anemia   . Peripheral vascular disease, unspecified ([place]) 11/19/2012    In the past had R foot toe amps then R TMA. In 2015 had left foot toe amp > then TMA >then L BKA on 05/14/13   . History of MRSA infection 04/22/2013    Bacteremia assoc w L foot wound infection Mar 2015   . Gangrene of foot ([place])   . ESRD on hemodialysis ([place])   . Diabetes mellitus with peripheral vascular disease ([place])   . Hypothyroidism   . Decubitus ulcer of sacral region, stage 2 03/07/2014  . Glaucoma 03/07/2014  . Gangrene ([place])     right BKA  . Contracture of joint     left  knee   Past Surgical History  Procedure Laterality Date  . Thyroidectomy    . Cervical disc surgery    . Insertion of dialysis catheter Right 03/19/2012    Procedure: INSERTION OF DIALYSIS CATHETER;  Surgeon: Mal Misty, MD;  Location: [place];  Service: Vascular;  Laterality: Right;  Right Internal Jugular  . Av fistula placement Left 03/25/2012    Procedure: ARTERIOVENOUS (AV) FISTULA CREATION;  Surgeon: Mal Misty, MD;  Location: [place];  Service: Vascular;  Laterality: Left;  . Ligation of competing branches of arteriovenous fistula Left 05/08/2012    Procedure: LIGATION OF COMPETING BRANCHES OF ARTERIOVENOUS FISTULA;  Surgeon: Mal Misty, MD;  Location: [place];  Service: Vascular;  Laterality: Left;  Ultrasound guided  . Cardiac catheterization      approx 30 years ago  . Amputation Right 11/10/2012    Procedure: AMPUTATION FIRST and SECOND TOES Right Foot;  Surgeon: Elam Dutch, MD;  Location: [facility] OR;  Service: Vascular;  Laterality: Right;  . Transmetatarsal amputation Left 12/16/2012    Procedure: TRANSMETATARSAL AMPUTATION AND VAC PLACEMENT;  Surgeon: Elam Dutch, MD;  Location: [place];  Service: Vascular;  Laterality: Left;  . Amputation Left 04/07/2013    Procedure: AMPUTATION DIGIT- LEFT 1ST TOE;  Surgeon: Mal Misty, MD;  Location: [place];  Service:  Vascular;  Laterality: Left;  . Tee without cardioversion N/A 04/20/2013    Procedure: TRANSESOPHAGEAL ECHOCARDIOGRAM (TEE);  Surgeon: Josue Hector, MD;  Location: [facility] ENDOSCOPY;  Service: Cardiovascular;  Laterality: N/A;  . I&d extremity Left 04/22/2013    Procedure: [id number] HXT DEBRIDEMENT LEFT FIRST TOE AMPUTATION WOUND ;  Surgeon: Mal Misty, MD;  Location: [place];  Service: Vascular;  Laterality: Left;  . Amputation Left 04/26/2013    Procedure: Left Foot Transmetatarsal Amputation;  Surgeon: Newt Minion, MD;  Location: [place];  Service: Orthopedics;  Laterality: Left;  . Toe amputation      D/C 04-30-13  .  Below knee leg amputation Left 05/14/2013    DR [name]  . Amputation Left 05/14/2013    Procedure: AMPUTATION BELOW KNEE;  Surgeon: Newt Minion, MD;  Location: [place];  Service: Orthopedics;  Laterality: Left;  Left Below Knee Amputation  . Eye surgery Bilateral     CATARACTS  . Amputation Left 07/23/2013    Procedure: AMPUTATION BELOW KNEE;  Surgeon: Newt Minion, MD;  Location: [place];  Service: Orthopedics;  Laterality: Left;  Left Below Knee Amputation Revision  . Abdominal aortagram Bilateral 11/06/2012    Procedure: ABDOMINAL AORTAGRAM;  Surgeon: Elam Dutch, MD;  Location: [facility] CATH LAB;  Service: Cardiovascular;  Laterality: Bilateral;  . I&d extremity Right 01/05/2014    Procedure: IRRIGATION AND DEBRIDEMENT Right Heel Ulcer;  Surgeon: Mcarthur Rossetti, MD;  Location: [place];  Service: Orthopedics;  Laterality: Right;  Surgeon Available after 5PM  . Below knee leg amputation Right 02/18/2014    dr duda  . Amputation Right 02/18/2014    Procedure: AMPUTATION BELOW KNEE;  Surgeon: Newt Minion, MD;  Location: [place];  Service: Orthopedics;  Laterality: Right;  . Stump revision Right 05/20/2014    Procedure: Revision Right Below Knee Amputation;  Surgeon: Newt Minion, MD;  Location: [place];  Service: Orthopedics;  Laterality: Right;  . Repair quadriceps/hamstring muscles Left 05/20/2014    Procedure: Left Hamstring Release;  Surgeon: Newt Minion, MD;  Location: [place];  Service: Orthopedics;  Laterality: Left;   Family History  Problem Relation Age of Onset  . Diabetes Mother   . Cancer Mother     bone   . Kidney disease Father    Social History  Substance Use Topics  . Smoking status: Former Smoker    Types: Cigarettes    Quit date: 03/19/1982  . Smokeless tobacco: Never Used  . Alcohol Use: No     Comment: formerly    Review of Systems  All systems reviewed and negative, other than as noted in HPI.   Allergies  Bee venom; Lisinopril; Ivp dye; and Morphine and  related  Home Medications   Prior to Admission medications   Medication Sig Start Date End Date Taking? Authorizing Provider  aspirin EC 81 MG tablet Take 81 mg by mouth daily.    Historical Provider, MD  calcium acetate (PHOSLO) 667 MG capsule Take 1,334 mg by mouth 3 (three) times daily with meals.    Historical Provider, MD  collagenase (SANTYL) ointment Apply 1 application topically daily. Reported on 02/14/2015    Historical Provider, MD  feeding supplement, RESOURCE BREEZE, (RESOURCE BREEZE) LIQD Take 1 Container by mouth 3 (three) times daily between meals.    Historical Provider, MD  lanthanum (FOSRENOL) 1000 MG chewable tablet Chew 1,000 mg by mouth daily. Reported on 02/14/2015    Historical Provider, MD  levothyroxine (SYNTHROID, LEVOTHROID) 175 MCG tablet Take 175 mcg by mouth daily before breakfast.    Historical Provider, MD  megestrol (MEGACE) 40 MG tablet Take 40 mg by mouth daily. 01/24/14   Historical Provider, MD  multivitamin (RENA-VIT) TABS tablet Take 1 tablet by mouth at bedtime.    Historical Provider, MD  oxyCODONE-acetaminophen (ROXICET) 5-325 MG per tablet Take 1 tablet by mouth every 4 (four) hours as needed for severe pain. 02/25/14   Newt Minion, MD  oxyCODONE-acetaminophen (ROXICET) 5-325 MG per tablet Take 1 tablet by mouth every 4 (four) hours as needed for severe pain. 05/20/14   Newt Minion, MD  predniSONE (DELTASONE) 20 MG tablet Take 2 tablets (40 mg total) by mouth daily. 02/20/15   Virgel Manifold, MD  promethazine (PHENERGAN) 12.5 MG tablet Take 1 tablet (12.5 mg total) by mouth every 6 (six) hours as needed for nausea or vomiting. 01/06/14   Ripudeep K Rai, MD   BP 138/98 mmHg  Pulse 75  Temp(Src) 98.2 F (36.8 C) (Oral)  Resp 18  SpO2 99% Physical Exam  Constitutional: He appears well-developed and well-nourished. No distress.  HENT:  Head: Normocephalic and atraumatic.  Mouth/Throat: Oropharynx is clear and moist.  Facial "puffiness." No overt  swelling of lips, tongue, uvula or elsewhere. Neck is supple. No stridor.  Eyes: Conjunctivae are normal. Right eye exhibits no discharge. Left eye exhibits no discharge.  Neck: Neck supple.  Cardiovascular: Normal rate, regular rhythm and normal heart sounds.  Exam reveals no gallop and no friction rub.   No murmur heard. Pulmonary/Chest: Effort normal and breath sounds normal. No respiratory distress. He has no wheezes.  Appears to be breathing comfortably. Speaks in complete sentences. No adventitious breath sounds.  Abdominal: Soft. He exhibits no distension. There is no tenderness.  Musculoskeletal: He exhibits no edema or tenderness.  Neurological: He is alert.  Skin: Skin is warm and dry. Rash noted.  Patchy urticarial appearing rash diffusely. Nontender.  Psychiatric: He has a normal mood and affect. His behavior is normal. Thought content normal.  Nursing note and vitals reviewed.   EDAside from Course  Procedures (including critical care time) Labs Review Labs Reviewed - No data to display  Imaging Review No results found. I have personally reviewed and evaluated these images and lab results as part of my medical decision-making.   EKG Interpretation None      MDM   Final diagnoses:  Urticaria    69yM with urticarial rash. No presyncopal symptoms, GI or respiratory complaints. HD stable. No evidence of airway compromise. Possibly food allergy. Advised to avoid thousand island dressing. Continue PRN benadryl. Course of steroids. Detailed return precautions.     Virgel Manifold, MD 02/20/15 [phone]

## 2015-02-21 ENCOUNTER — Ambulatory Visit: Payer: Medicare Other | Admitting: Physical Therapy

## 2015-02-23 ENCOUNTER — Encounter: Payer: Self-pay | Admitting: Physical Therapy

## 2015-02-23 ENCOUNTER — Ambulatory Visit: Payer: Medicare Other | Attending: Orthopedic Surgery | Admitting: Physical Therapy

## 2015-02-23 DIAGNOSIS — R6889 Other general symptoms and signs: Secondary | ICD-10-CM | POA: Insufficient documentation

## 2015-02-23 DIAGNOSIS — R29898 Other symptoms and signs involving the musculoskeletal system: Secondary | ICD-10-CM | POA: Diagnosis present

## 2015-02-23 DIAGNOSIS — R2681 Unsteadiness on feet: Secondary | ICD-10-CM | POA: Insufficient documentation

## 2015-02-23 DIAGNOSIS — Z89511 Acquired absence of right leg below knee: Secondary | ICD-10-CM | POA: Insufficient documentation

## 2015-02-23 DIAGNOSIS — R269 Unspecified abnormalities of gait and mobility: Secondary | ICD-10-CM | POA: Diagnosis present

## 2015-02-23 DIAGNOSIS — Z5189 Encounter for other specified aftercare: Secondary | ICD-10-CM | POA: Insufficient documentation

## 2015-02-23 DIAGNOSIS — R29818 Other symptoms and signs involving the nervous system: Secondary | ICD-10-CM | POA: Insufficient documentation

## 2015-02-23 DIAGNOSIS — Z4789 Encounter for other orthopedic aftercare: Secondary | ICD-10-CM

## 2015-02-23 DIAGNOSIS — Z89512 Acquired absence of left leg below knee: Secondary | ICD-10-CM | POA: Diagnosis present

## 2015-02-26 NOTE — Therapy (Signed)
[facility] [address] [place], [place], [address] Phone: [phone]   Fax:  [phone]  Physical Therapy Treatment  Patient Details  Name: Jack [name] MRN: [id number] Date of Birth: Oct 24, 1945 Referring Provider: Meridee Score, MD  Encounter Date: 02/23/2015   02/23/15 0808  PT Visits / Re-Eval  Visit Number 2  Number of Visits 17  Date for PT Re-Evaluation 04/13/15  Authorization  Authorization Type G-code & progress note every 10 visits  PT Time Calculation  PT Start Time 0805  PT Stop Time 0845  PT Time Calculation (min) 40 min  PT - End of Session  Equipment Utilized During Treatment Gait belt  Activity Tolerance Patient tolerated treatment well;Patient limited by fatigue  Behavior During Therapy [facility] for tasks assessed/performed     Past Medical History  Diagnosis Date  . Hypertension   . Pneumonia     2012  . Heart murmur   . Glaucoma   . Multiple myeloma, without mention of having achieved remission 03/30/2012    Cytogenetic neg on 03/23/2012.  . Asthma   . Hyperparathyroidism, secondary renal ([place])   . Peripheral arterial disease ([place])   . End-stage renal disease on hemodialysis [facility])     Started HD March 2014.  Cause of ESRD was DM.  Gets HD at [facility] on [address] on MWF schedule.   . Thyroid disease     hyperparathyroidism  . MRSA bacteremia   . Anemia   . Peripheral vascular disease, unspecified ([place]) 11/19/2012    In the past had R foot toe amps then R TMA. In 2015 had left foot toe amp > then TMA >then L BKA on 05/14/13   . History of MRSA infection 04/22/2013    Bacteremia assoc w L foot wound infection Mar 2015   . Gangrene of foot ([place])   . ESRD on hemodialysis ([place])   . Diabetes mellitus with peripheral vascular disease ([place])   . Hypothyroidism   . Decubitus ulcer of sacral region, stage 2 03/07/2014  . Glaucoma 03/07/2014  . Gangrene ([place])     right BKA  . Contracture of  joint     left knee    Past Surgical History  Procedure Laterality Date  . Thyroidectomy    . Cervical disc surgery    . Insertion of dialysis catheter Right 03/19/2012    Procedure: INSERTION OF DIALYSIS CATHETER;  Surgeon: Mal Misty, MD;  Location: [place];  Service: Vascular;  Laterality: Right;  Right Internal Jugular  . Av fistula placement Left 03/25/2012    Procedure: ARTERIOVENOUS (AV) FISTULA CREATION;  Surgeon: Mal Misty, MD;  Location: [place];  Service: Vascular;  Laterality: Left;  . Ligation of competing branches of arteriovenous fistula Left 05/08/2012    Procedure: LIGATION OF COMPETING BRANCHES OF ARTERIOVENOUS FISTULA;  Surgeon: Mal Misty, MD;  Location: [place];  Service: Vascular;  Laterality: Left;  Ultrasound guided  . Cardiac catheterization      approx 30 years ago  . Amputation Right 11/10/2012    Procedure: AMPUTATION FIRST and SECOND TOES Right Foot;  Surgeon: Elam Dutch, MD;  Location: [facility] OR;  Service: Vascular;  Laterality: Right;  . Transmetatarsal amputation Left 12/16/2012    Procedure: TRANSMETATARSAL AMPUTATION AND VAC PLACEMENT;  Surgeon: Elam Dutch, MD;  Location: [place];  Service: Vascular;  Laterality: Left;  . Amputation Left 04/07/2013    Procedure: AMPUTATION DIGIT- LEFT 1ST TOE;  Surgeon: Jeneen Rinks  Rockwell [place], MD;  Location: [place];  Service: Vascular;  Laterality: Left;  . Tee without cardioversion N/A 04/20/2013    Procedure: TRANSESOPHAGEAL ECHOCARDIOGRAM (TEE);  Surgeon: Josue Hector, MD;  Location: [facility] ENDOSCOPY;  Service: Cardiovascular;  Laterality: N/A;  . I&d extremity Left 04/22/2013    Procedure: GYJEHUDJSH FWY DEBRIDEMENT LEFT FIRST TOE AMPUTATION WOUND ;  Surgeon: Mal Misty, MD;  Location: [place];  Service: Vascular;  Laterality: Left;  . Amputation Left 04/26/2013    Procedure: Left Foot Transmetatarsal Amputation;  Surgeon: Newt Minion, MD;  Location: [place];  Service: Orthopedics;  Laterality: Left;  . Toe amputation       D/C 04-30-13  . Below knee leg amputation Left 05/14/2013    DR [name]  . Amputation Left 05/14/2013    Procedure: AMPUTATION BELOW KNEE;  Surgeon: Newt Minion, MD;  Location: [place];  Service: Orthopedics;  Laterality: Left;  Left Below Knee Amputation  . Eye surgery Bilateral     CATARACTS  . Amputation Left 07/23/2013    Procedure: AMPUTATION BELOW KNEE;  Surgeon: Newt Minion, MD;  Location: [place];  Service: Orthopedics;  Laterality: Left;  Left Below Knee Amputation Revision  . Abdominal aortagram Bilateral 11/06/2012    Procedure: ABDOMINAL AORTAGRAM;  Surgeon: Elam Dutch, MD;  Location: [facility] CATH LAB;  Service: Cardiovascular;  Laterality: Bilateral;  . I&d extremity Right 01/05/2014    Procedure: IRRIGATION AND DEBRIDEMENT Right Heel Ulcer;  Surgeon: Mcarthur Rossetti, MD;  Location: [place];  Service: Orthopedics;  Laterality: Right;  Surgeon Available after 5PM  . Below knee leg amputation Right 02/18/2014    dr duda  . Amputation Right 02/18/2014    Procedure: AMPUTATION BELOW KNEE;  Surgeon: Newt Minion, MD;  Location: [place];  Service: Orthopedics;  Laterality: Right;  . Stump revision Right 05/20/2014    Procedure: Revision Right Below Knee Amputation;  Surgeon: Newt Minion, MD;  Location: [place];  Service: Orthopedics;  Laterality: Right;  . Repair quadriceps/hamstring muscles Left 05/20/2014    Procedure: Left Hamstring Release;  Surgeon: Newt Minion, MD;  Location: [place];  Service: Orthopedics;  Laterality: Left;    There were no vitals filed for this visit.  Visit Diagnosis:  Weakness of both legs  Abnormality of gait  Decreased functional activity tolerance  Encounter for prosthetic gait training  Unsteadiness     02/23/15 0806  Symptoms/Limitations  Subjective Had to have fistula cleaned out for dialysis and they used a dye that he is allergic too. Now with hives (rash) all over, saw [place] Md and pills to clear it up.   Patient is accompained by: Family  member (daughter)  Pertinent History ESRD, Bil. Transtibial Amputations, HTN, Type 1 DM, asthma, multiple myelomas, hyperparathyroidism, glaucoma, gout, PVD  Patient Stated Goals He wants to walk more and get out of w/c.   Pain Assessment  Currently in Pain? No/denies  Pain Score 0     02/23/15 0809  Transfers  Transfers Sit to Stand;Stand to Sit;Squat Pivot Transfers  Sit to Stand 4: Min assist;With upper extremity assist;From chair/3-in-1;With armrests  Sit to Stand Details Verbal cues for precautions/safety;Verbal cues for technique;Verbal cues for sequencing  Stand to Sit 4: Min assist;With upper extremity assist;With armrests;To chair/3-in-1;Uncontrolled descent  Stand to Sit Details (indicate cue type and reason) Verbal cues for technique;Verbal cues for precautions/safety  Ambulation/Gait  Ambulation/Gait Yes  Ambulation/Gait Assistance 4: Min guard;4: Min assist  Ambulation/Gait Assistance Details increased assistance as gait distance progressed.   Ambulation Distance (Feet) 72 Feet (x1, 62 x1)  Assistive device Prostheses;Rolling walker  Gait Pattern Step-through pattern;Decreased stride length;Decreased step length - right;Decreased step length - left;Right flexed knee in stance;Left flexed knee in stance;Trunk flexed  Ambulation Surface Level;Indoor  Knee/Hip Exercises: Machines for Strengthening  Cybex Leg Press both legs 60# 5 sec holds, 2 sets x 10 reps; right leg only 30#, 5 sec hold x 10 reps; left leg only 30# 5 sec hold x 10 reps.                                                       Prosthetics  Current prosthetic wear tolerance (days/week)  7 days /wk  Current prosthetic wear tolerance (#hours/day)  all awake hours except during dialysis or bathing  Edema none  Residual limb condition  now with hives/rash on legs down to knee area, no open areas  Donning Prosthesis 5  Doffing Prosthesis 6          PT Short Term Goals - 02/14/15 0845    PT SHORT TERM GOAL  #1   Title Patient verbalizes understanding of updated HEP to progress activity tolerance. (Target Date: 03/16/2015)   Time 4   Period Weeks   Status New   PT SHORT TERM GOAL #2   Title Patient ambulates 100' with RW & prostheses with supervision.  (Target Date: 03/16/2015)   Time 4   Period Weeks   Status New   PT SHORT TERM GOAL #3   Title Patient negotiates ramps & curbs with rolling walker & prostheses with moderate assist.  (Target Date: 03/16/2015)   Time 4   Period Weeks   Status New   PT SHORT TERM GOAL #4   Title Patient reaches 4" beyond length of his arm anteriorly and to ankle level towards ground with RW support with supervision.  (Target Date: 03/16/2015)   Time 4   Period Weeks   Status New           PT Long Term Goals - 02/14/15 0845    PT LONG TERM GOAL #1   Title Patient verbalizes understanding of ongoing HEP including [employer] to progress strength, flexibility, balance & endurance.  (Target Date: 03/16/2015)   Time 8   Period Weeks   Status New   PT LONG TERM GOAL #2   Title TImed Up & Go with rolling walker <45 seconds modified independent to indicate lower fall risk.  (Target Date: 03/16/2015)   Time 8   Period Weeks   Status New   PT LONG TERM GOAL #3   Title Patient ambulates 125' with RW & prostheses including negotiating around obstacles for limited community mobility.  (Target Date: 03/16/2015)   Time 8   Period Weeks   Status New   PT LONG TERM GOAL #4   Title Patient negotiates ramps & curbs with RW and stairs with 2 rails with minimal assist from family to enable community access.  (Target Date: 03/16/2015)   Time 8   Period Weeks   Status New   PT LONG TERM GOAL #5   Title Patient reaches 5" all directions and to floor with rolling walker support safely modified independent.  (Target Date: 03/16/2015)   Time 8   Period Weeks  Status New        02/23/15 0808  Plan  Clinical Impression Statement Skilled session focues on gait with prostheses and  strengthening of LE's. No issues reported during session. Pt is making steady progress toward goals.  Pt will benefit from skilled therapeutic intervention in order to improve on the following deficits Abnormal gait;Decreased activity tolerance;Decreased balance;Decreased endurance;Decreased knowledge of use of DME;Decreased mobility;Decreased range of motion;Decreased strength;Prosthetic Dependency  Rehab Potential Good  Clinical Impairments Affecting Rehab Potential Dialysis limits strength and endurance with slow gains.   PT Frequency 2x / week  PT Duration 8 weeks  PT Treatment/Interventions ADLs/Self Care Home Management;DME Instruction;Gait training;Functional mobility training;Therapeutic activities;Therapeutic exercise;Balance training;Neuromuscular re-education;Patient/family education;Prosthetic Training;Passive range of motion;Stair training  PT Next Visit Plan progressive gait with instruction to increase frequency of gait with family to initiate improving activity tolerance  Consulted and Agree with Plan of Care Patient  Family Member Consulted dtr, Cora       Problem List Patient Active Problem List   Diagnosis Date Noted  . Complications, amputation stump late ([place]) 05/20/2014  . Weakness generalized 04/07/2014  . Sepsis ([place]) 04/07/2014  . Decubitus ulcer, stage II 04/07/2014  . Decubitus ulcer of ankle   . Fatigue   . Wound infection ([place]) 04/06/2014  . Decubitus ulcer of sacral region, stage 2 03/07/2014  . Glaucoma 03/07/2014  . Gout 03/07/2014  . Below knee amputation status ([place]) 02/18/2014  . Osteomyelitis ([place]) 01/04/2014  . Phantom limb ([place]) 12/14/2013  . Type 1 diabetes mellitus with diabetic foot infection ([place])   . Diabetes mellitus with peripheral vascular disease ([place])   . Asthma 05/17/2013  . History of MRSA infection 04/22/2013  . Peripheral vascular disease ([place]) 11/19/2012  . End-stage renal disease on hemodialysis ([place]) 05/05/2012  . Kahler  disease ([place]) 03/30/2012  . Anemia in chronic kidney disease 03/19/2012  . Hypothyroidism 03/19/2012  . Anemia, iron deficiency 03/19/2012  . Hypertension 03/18/2012  . Chronic kidney disease (CKD), stage V ([place]) 03/18/2012  . Cardiac conduction disorder 03/18/2012  . MGUS (monoclonal gammopathy of unknown significance) 02/28/2011  . Monoclonal paraproteinemia 02/28/2011    Willow Ora 02/26/2015, 9:30 PM  Willow Ora, PTA, [facility] [address], [place], [place] [address] [phone] 02/26/2015, 9:37 PM   Name: Jack [name] MRN: [id number] Date of Birth: September 30, 1945

## 2015-02-28 ENCOUNTER — Encounter: Payer: Self-pay | Admitting: Physical Therapy

## 2015-02-28 ENCOUNTER — Ambulatory Visit: Payer: Medicare Other | Admitting: Physical Therapy

## 2015-02-28 DIAGNOSIS — R2681 Unsteadiness on feet: Secondary | ICD-10-CM

## 2015-02-28 DIAGNOSIS — R29898 Other symptoms and signs involving the musculoskeletal system: Secondary | ICD-10-CM

## 2015-02-28 DIAGNOSIS — Z89512 Acquired absence of left leg below knee: Secondary | ICD-10-CM

## 2015-02-28 DIAGNOSIS — R6889 Other general symptoms and signs: Secondary | ICD-10-CM

## 2015-02-28 DIAGNOSIS — R2689 Other abnormalities of gait and mobility: Secondary | ICD-10-CM

## 2015-02-28 DIAGNOSIS — Z89511 Acquired absence of right leg below knee: Secondary | ICD-10-CM

## 2015-02-28 DIAGNOSIS — R269 Unspecified abnormalities of gait and mobility: Secondary | ICD-10-CM

## 2015-02-28 DIAGNOSIS — Z4789 Encounter for other orthopedic aftercare: Secondary | ICD-10-CM

## 2015-02-28 NOTE — Therapy (Signed)
[facility] [address] [place], [place], [address] Phone: [phone]   Fax:  [phone]  Physical Therapy Treatment  Patient Details  Name: Jack [name] MRN: [id number] Date of Birth: 13-Aug-1945 Referring Provider: Meridee Score, MD  Encounter Date: 02/28/2015      PT End of Session - 02/28/15 1136    Visit Number 3   Number of Visits 17   Date for PT Re-Evaluation 04/13/15   Authorization Type G-code & progress note every 10 visits   PT Start Time 0846   PT Stop Time 0930   PT Time Calculation (min) 44 min   Equipment Utilized During Treatment Gait belt   Activity Tolerance Patient tolerated treatment well;Patient limited by fatigue   Behavior During Therapy [facility] for tasks assessed/performed      Past Medical History  Diagnosis Date  . Hypertension   . Pneumonia     2012  . Heart murmur   . Glaucoma   . Multiple myeloma, without mention of having achieved remission 03/30/2012    Cytogenetic neg on 03/23/2012.  . Asthma   . Hyperparathyroidism, secondary renal ([place])   . Peripheral arterial disease ([place])   . End-stage renal disease on hemodialysis [facility])     Started HD March 2014.  Cause of ESRD was DM.  Gets HD at [facility] on [place] on MWF schedule.   . Thyroid disease     hyperparathyroidism  . MRSA bacteremia   . Anemia   . Peripheral vascular disease, unspecified ([place]) 11/19/2012    In the past had R foot toe amps then R TMA. In 2015 had left foot toe amp > then TMA >then L BKA on 05/14/13   . History of MRSA infection 04/22/2013    Bacteremia assoc w L foot wound infection Mar 2015   . Gangrene of foot ([place])   . ESRD on hemodialysis ([place])   . Diabetes mellitus with peripheral vascular disease ([place])   . Hypothyroidism   . Decubitus ulcer of sacral region, stage 2 03/07/2014  . Glaucoma 03/07/2014  . Gangrene ([place])     right BKA  . Contracture of joint     left knee    Past Surgical History   Procedure Laterality Date  . Thyroidectomy    . Cervical disc surgery    . Insertion of dialysis catheter Right 03/19/2012    Procedure: INSERTION OF DIALYSIS CATHETER;  Surgeon: Mal Misty, MD;  Location: [place];  Service: Vascular;  Laterality: Right;  Right Internal Jugular  . Av fistula placement Left 03/25/2012    Procedure: ARTERIOVENOUS (AV) FISTULA CREATION;  Surgeon: Mal Misty, MD;  Location: [place];  Service: Vascular;  Laterality: Left;  . Ligation of competing branches of arteriovenous fistula Left 05/08/2012    Procedure: LIGATION OF COMPETING BRANCHES OF ARTERIOVENOUS FISTULA;  Surgeon: Mal Misty, MD;  Location: [place];  Service: Vascular;  Laterality: Left;  Ultrasound guided  . Cardiac catheterization      approx 30 years ago  . Amputation Right 11/10/2012    Procedure: AMPUTATION FIRST and SECOND TOES Right Foot;  Surgeon: Elam Dutch, MD;  Location: [facility] OR;  Service: Vascular;  Laterality: Right;  . Transmetatarsal amputation Left 12/16/2012    Procedure: TRANSMETATARSAL AMPUTATION AND VAC PLACEMENT;  Surgeon: Elam Dutch, MD;  Location: [place];  Service: Vascular;  Laterality: Left;  . Amputation Left 04/07/2013    Procedure: AMPUTATION DIGIT- LEFT 1ST TOE;  Surgeon: Mal Misty, MD;  Location: [place];  Service: Vascular;  Laterality: Left;  . Tee without cardioversion N/A 04/20/2013    Procedure: TRANSESOPHAGEAL ECHOCARDIOGRAM (TEE);  Surgeon: Josue Hector, MD;  Location[facility] ENDOSCOPY;  Service: Cardiovascular;  Laterality: N/A;  . I&d extremity Left 04/22/2013    Procedure: BPZWCHENID POE DEBRIDEMENT LEFT FIRST TOE AMPUTATION WOUND ;  Surgeon: Mal Misty, MD;  Location: [place];  Service: Vascular;  Laterality: Left;  . Amputation Left 04/26/2013    Procedure: Left Foot Transmetatarsal Amputation;  Surgeon: Newt Minion, MD;  Location: [place];  Service: Orthopedics;  Laterality: Left;  . Toe amputation      D/C 04-30-13  . Below knee leg amputation Left  05/14/2013    DR [name]  . Amputation Left 05/14/2013    Procedure: AMPUTATION BELOW KNEE;  Surgeon: Newt Minion, MD;  Location: [place];  Service: Orthopedics;  Laterality: Left;  Left Below Knee Amputation  . Eye surgery Bilateral     CATARACTS  . Amputation Left 07/23/2013    Procedure: AMPUTATION BELOW KNEE;  Surgeon: Newt Minion, MD;  Location: [place];  Service: Orthopedics;  Laterality: Left;  Left Below Knee Amputation Revision  . Abdominal aortagram Bilateral 11/06/2012    Procedure: ABDOMINAL AORTAGRAM;  Surgeon: Elam Dutch, MD;  Location: [facility] CATH LAB;  Service: Cardiovascular;  Laterality: Bilateral;  . I&d extremity Right 01/05/2014    Procedure: IRRIGATION AND DEBRIDEMENT Right Heel Ulcer;  Surgeon: Mcarthur Rossetti, MD;  Location: [place];  Service: Orthopedics;  Laterality: Right;  Surgeon Available after 5PM  . Below knee leg amputation Right 02/18/2014    dr duda  . Amputation Right 02/18/2014    Procedure: AMPUTATION BELOW KNEE;  Surgeon: Newt Minion, MD;  Location: [place];  Service: Orthopedics;  Laterality: Right;  . Stump revision Right 05/20/2014    Procedure: Revision Right Below Knee Amputation;  Surgeon: Newt Minion, MD;  Location: [place];  Service: Orthopedics;  Laterality: Right;  . Repair quadriceps/hamstring muscles Left 05/20/2014    Procedure: Left Hamstring Release;  Surgeon: Newt Minion, MD;  Location: [place];  Service: Orthopedics;  Laterality: Left;    There were no vitals filed for this visit.  Visit Diagnosis:  Weakness of both legs  Abnormality of gait  Decreased functional activity tolerance  Encounter for prosthetic gait training  Unsteadiness  Balance problems  Status post bilateral below knee amputation (HCC)      Subjective Assessment - 02/28/15 0850    Subjective His rash is better. No rash under prostheses.    Currently in Pain? No/denies      PT ambulated from areas in clinic 20'-40' X 4 with RW with supervision.  Standing  at counter with posterior pelvis against counter & BUE on counter edge with RW in front for safety, pushing upward for 10-15 seconds with deep breathing into upright posture for 5 reps. Patient was able to decrease how quickly he had to return to RW support. Leg Press: PT instructed in seat set-up performed with too little wt of 30#, too much wt of 80# and correct wt currently of 60# 15 reps bilaterally, right leg only 40# 10 reps and left leg only 30# 10 reps. Pt & dtr verbalize understanding how to determine appropriate wt.  NuStep: PT instructed in seat & UE adjustment, amount of resistance and speed. Pt performed 26mn at level 3 while PT instructed with goal to increase  to 20-30 minute tolerance prior to increasing resistance. Can start with 12mn work 4 min rest 3 sets and build to 30 minutes straight. Pt & dtr verbalized understanding.                            PT Education - 02/28/15 1134    Education provided Yes   Education Details proper set-up including amount of weight & resistance for leg press & NuStep   Person(s) Educated Patient;Child(ren)   Methods Explanation;Demonstration;Verbal cues   Comprehension Verbalized understanding;Returned demonstration;Verbal cues required          PT Short Term Goals - 02/14/15 0845    PT SHORT TERM GOAL #1   Title Patient verbalizes understanding of updated HEP to progress activity tolerance. (Target Date: 03/16/2015)   Time 4   Period Weeks   Status New   PT SHORT TERM GOAL #2   Title Patient ambulates 100' with RW & prostheses with supervision.  (Target Date: 03/16/2015)   Time 4   Period Weeks   Status New   PT SHORT TERM GOAL #3   Title Patient negotiates ramps & curbs with rolling walker & prostheses with moderate assist.  (Target Date: 03/16/2015)   Time 4   Period Weeks   Status New   PT SHORT TERM GOAL #4   Title Patient reaches 4" beyond length of his arm anteriorly and to ankle level towards ground with RW  support with supervision.  (Target Date: 03/16/2015)   Time 4   Period Weeks   Status New           PT Long Term Goals - 02/28/15 1136    PT LONG TERM GOAL #1   Title Patient verbalizes understanding of ongoing HEP including [employer] to progress strength, flexibility, balance & endurance.  (Target Date: 04/14/2015)   Time 8   Period Weeks   Status On-going   PT LONG TERM GOAL #2   Title TImed Up & Go with rolling walker <45 seconds modified independent to indicate lower fall risk.  (Target Date: 04/14/2015)   Time 8   Period Weeks   Status On-going   PT LONG TERM GOAL #3   Title Patient ambulates 125' with RW & prostheses including negotiating around obstacles for limited community mobility.  (Target Date: 04/14/2015)   Time 8   Period Weeks   Status On-going   PT LONG TERM GOAL #4   Title Patient negotiates ramps & curbs with RW and stairs with 2 rails with minimal assist from family to enable community access.  (Target Date: 04/14/2015)   Time 8   Period Weeks   Status On-going   PT LONG TERM GOAL #5   Title Patient reaches 5" all directions and to floor with rolling walker support safely modified independent.  (Target Date: 04/14/2015)   Time 8   Period Weeks   Status On-going               Plan - 02/28/15 1139    Clinical Impression Statement Patient and daughter appear to understand basics to using leg press & NuStep machines. [facility] (days he does not have PT or dialysis) which would aggument PT.    Pt will benefit from skilled therapeutic intervention in order to improve on the following deficits Abnormal gait;Decreased activity tolerance;Decreased balance;Decreased endurance;Decreased knowledge of use of DME;Decreased  mobility;Decreased range of motion;Decreased strength;Prosthetic Dependency   Rehab Potential Good   Clinical Impairments Affecting Rehab Potential Dialysis limits strength and endurance  with slow gains.    PT Frequency 2x / week   PT Duration 8 weeks   PT Treatment/Interventions ADLs/Self Care Home Management;DME Instruction;Gait training;Functional mobility training;Therapeutic activities;Therapeutic exercise;Balance training;Neuromuscular re-education;Patient/family education;Prosthetic Training;Passive range of motion;Stair training   PT Next Visit Plan progressive gait with instruction to increase frequency of gait with family to initiate improving activity tolerance   Consulted and Agree with Plan of Care Patient   Family Member Consulted dtr, Cora        Problem List Patient Active Problem List   Diagnosis Date Noted  . Complications, amputation stump late ([place]) 05/20/2014  . Weakness generalized 04/07/2014  . Sepsis ([place]) 04/07/2014  . Decubitus ulcer, stage II 04/07/2014  . Decubitus ulcer of ankle   . Fatigue   . Wound infection ([place]) 04/06/2014  . Decubitus ulcer of sacral region, stage 2 03/07/2014  . Glaucoma 03/07/2014  . Gout 03/07/2014  . Below knee amputation status ([place]) 02/18/2014  . Osteomyelitis ([place]) 01/04/2014  . Phantom limb ([place]) 12/14/2013  . Type 1 diabetes mellitus with diabetic foot infection ([place])   . Diabetes mellitus with peripheral vascular disease ([place])   . Asthma 05/17/2013  . History of MRSA infection 04/22/2013  . Peripheral vascular disease ([place]) 11/19/2012  . End-stage renal disease on hemodialysis ([place]) 05/05/2012  . Kahler disease ([place]) 03/30/2012  . Anemia in chronic kidney disease 03/19/2012  . Hypothyroidism 03/19/2012  . Anemia, iron deficiency 03/19/2012  . Hypertension 03/18/2012  . Chronic kidney disease (CKD), stage V ([place]) 03/18/2012  . Cardiac conduction disorder 03/18/2012  . MGUS (monoclonal gammopathy of unknown significance) 02/28/2011  . Monoclonal paraproteinemia 02/28/2011    Jamey Reas PT, DPT 02/28/2015, 11:42 AM  [facility] [address] [place], [place], [address] Phone: [phone]   Fax:  [phone]  Name: Jack [name] MRN: [id number] Date of Birth: 10-19-1945

## 2015-03-02 ENCOUNTER — Ambulatory Visit: Payer: Medicare Other | Admitting: Physical Therapy

## 2015-03-02 ENCOUNTER — Encounter: Payer: Self-pay | Admitting: Physical Therapy

## 2015-03-02 DIAGNOSIS — R2681 Unsteadiness on feet: Secondary | ICD-10-CM

## 2015-03-02 DIAGNOSIS — R29898 Other symptoms and signs involving the musculoskeletal system: Secondary | ICD-10-CM

## 2015-03-02 DIAGNOSIS — R6889 Other general symptoms and signs: Secondary | ICD-10-CM

## 2015-03-02 DIAGNOSIS — Z4789 Encounter for other orthopedic aftercare: Secondary | ICD-10-CM

## 2015-03-02 DIAGNOSIS — R269 Unspecified abnormalities of gait and mobility: Secondary | ICD-10-CM

## 2015-03-02 DIAGNOSIS — R2689 Other abnormalities of gait and mobility: Secondary | ICD-10-CM

## 2015-03-02 NOTE — Therapy (Signed)
[facility] [address] [place], [place], [address] Phone: [phone]   Fax:  [phone]  Physical Therapy Treatment  Patient Details  Name: Jack [name] MRN: [id number] Date of Birth: 1945-02-08 Referring Provider: Meridee Score, MD  Encounter Date: 03/02/2015      PT End of Session - 03/02/15 0807    Visit Number 4   Number of Visits 17   Date for PT Re-Evaluation 04/13/15   Authorization Type G-code & progress note every 10 visits   PT Start Time 0804   PT Stop Time 0847   PT Time Calculation (min) 43 min   Equipment Utilized During Treatment Gait belt   Activity Tolerance Patient tolerated treatment well;Patient limited by fatigue   Behavior During Therapy [facility] for tasks assessed/performed      Past Medical History  Diagnosis Date  . Hypertension   . Pneumonia     2012  . Heart murmur   . Glaucoma   . Multiple myeloma, without mention of having achieved remission 03/30/2012    Cytogenetic neg on 03/23/2012.  . Asthma   . Hyperparathyroidism, secondary renal ([place])   . Peripheral arterial disease ([place])   . End-stage renal disease on hemodialysis [facility])     Started HD March 2014.  Cause of ESRD was DM.  Gets HD at [facility] on [address] on MWF schedule.   . Thyroid disease     hyperparathyroidism  . MRSA bacteremia   . Anemia   . Peripheral vascular disease, unspecified ([place]) 11/19/2012    In the past had R foot toe amps then R TMA. In 2015 had left foot toe amp > then TMA >then L BKA on 05/14/13   . History of MRSA infection 04/22/2013    Bacteremia assoc w L foot wound infection Mar 2015   . Gangrene of foot ([place])   . ESRD on hemodialysis ([place])   . Diabetes mellitus with peripheral vascular disease ([place])   . Hypothyroidism   . Decubitus ulcer of sacral region, stage 2 03/07/2014  . Glaucoma 03/07/2014  . Gangrene ([place])     right BKA  . Contracture of joint     left knee    Past Surgical History   Procedure Laterality Date  . Thyroidectomy    . Cervical disc surgery    . Insertion of dialysis catheter Right 03/19/2012    Procedure: INSERTION OF DIALYSIS CATHETER;  Surgeon: Mal Misty, MD;  Location: [place];  Service: Vascular;  Laterality: Right;  Right Internal Jugular  . Av fistula placement Left 03/25/2012    Procedure: ARTERIOVENOUS (AV) FISTULA CREATION;  Surgeon: Mal Misty, MD;  Location: [place];  Service: Vascular;  Laterality: Left;  . Ligation of competing branches of arteriovenous fistula Left 05/08/2012    Procedure: LIGATION OF COMPETING BRANCHES OF ARTERIOVENOUS FISTULA;  Surgeon: Mal Misty, MD;  Location: [place];  Service: Vascular;  Laterality: Left;  Ultrasound guided  . Cardiac catheterization      approx 30 years ago  . Amputation Right 11/10/2012    Procedure: AMPUTATION FIRST and SECOND TOES Right Foot;  Surgeon: Elam Dutch, MD;  Location: [facility] OR;  Service: Vascular;  Laterality: Right;  . Transmetatarsal amputation Left 12/16/2012    Procedure: TRANSMETATARSAL AMPUTATION AND VAC PLACEMENT;  Surgeon: Elam Dutch, MD;  Location: [place];  Service: Vascular;  Laterality: Left;  . Amputation Left 04/07/2013    Procedure: AMPUTATION DIGIT- LEFT 1ST TOE;  Surgeon: Mal Misty, MD;  Location: [place];  Service: Vascular;  Laterality: Left;  . Tee without cardioversion N/A 04/20/2013    Procedure: TRANSESOPHAGEAL ECHOCARDIOGRAM (TEE);  Surgeon: Josue Hector, MD;  Location: [facility] ENDOSCOPY;  Service: Cardiovascular;  Laterality: N/A;  . I&d extremity Left 04/22/2013    Procedure: [id number] EEF DEBRIDEMENT LEFT FIRST TOE AMPUTATION WOUND ;  Surgeon: Mal Misty, MD;  Location: [place];  Service: Vascular;  Laterality: Left;  . Amputation Left 04/26/2013    Procedure: Left Foot Transmetatarsal Amputation;  Surgeon: Newt Minion, MD;  Location: [place];  Service: Orthopedics;  Laterality: Left;  . Toe amputation      D/C 04-30-13  . Below knee leg amputation Left  05/14/2013    DR [name]  . Amputation Left 05/14/2013    Procedure: AMPUTATION BELOW KNEE;  Surgeon: Newt Minion, MD;  Location: [place];  Service: Orthopedics;  Laterality: Left;  Left Below Knee Amputation  . Eye surgery Bilateral     CATARACTS  . Amputation Left 07/23/2013    Procedure: AMPUTATION BELOW KNEE;  Surgeon: Newt Minion, MD;  Location: [place];  Service: Orthopedics;  Laterality: Left;  Left Below Knee Amputation Revision  . Abdominal aortagram Bilateral 11/06/2012    Procedure: ABDOMINAL AORTAGRAM;  Surgeon: Elam Dutch, MD;  Location: [facility] CATH LAB;  Service: Cardiovascular;  Laterality: Bilateral;  . I&d extremity Right 01/05/2014    Procedure: IRRIGATION AND DEBRIDEMENT Right Heel Ulcer;  Surgeon: Mcarthur Rossetti, MD;  Location: [place];  Service: Orthopedics;  Laterality: Right;  Surgeon Available after 5PM  . Below knee leg amputation Right 02/18/2014    dr duda  . Amputation Right 02/18/2014    Procedure: AMPUTATION BELOW KNEE;  Surgeon: Newt Minion, MD;  Location: [place];  Service: Orthopedics;  Laterality: Right;  . Stump revision Right 05/20/2014    Procedure: Revision Right Below Knee Amputation;  Surgeon: Newt Minion, MD;  Location: [place];  Service: Orthopedics;  Laterality: Right;  . Repair quadriceps/hamstring muscles Left 05/20/2014    Procedure: Left Hamstring Release;  Surgeon: Newt Minion, MD;  Location: [place];  Service: Orthopedics;  Laterality: Left;    There were no vitals filed for this visit.  Visit Diagnosis:  Weakness of both legs  Abnormality of gait  Decreased functional activity tolerance  Encounter for prosthetic gait training  Unsteadiness  Balance problems      Subjective Assessment - 03/02/15 0807    Subjective No new complaints. No pain or falls to report.   Patient is accompained by: Family member  daughter   Pertinent History ESRD, Bil. Transtibial Amputations, HTN, Type 1 DM, asthma, multiple myelomas, hyperparathyroidism,  glaucoma, gout, PVD   Patient Stated Goals He wants to walk more and get out of w/c.    Pain Score 0-No pain          [facility] Adult PT Treatment/Exercise - 03/02/15 0808    Transfers   Transfers Sit to Stand;Stand to Sit;Squat Pivot Transfers   Sit to Stand 4: Min assist;With upper extremity assist;From chair/3-in-1;With armrests   Sit to Stand Details Verbal cues for precautions/safety;Verbal cues for technique;Verbal cues for sequencing   Stand to Sit 4: Min assist;With upper extremity assist;With armrests;To chair/3-in-1;Uncontrolled descent   Stand to Sit Details (indicate cue type and reason) Verbal cues for technique;Verbal cues for precautions/safety   Ambulation/Gait   Ambulation/Gait Yes   Ambulation/Gait Assistance 4: Min guard;4: Min assist  Ambulation/Gait Assistance Details cues on posture, step length, for increased knee extension in stance and for walker position with gait. increased assistance for balance needed as gait progressed.         Ambulation Distance (Feet) 98 Feet  x1, 45 x1   Assistive device Prostheses;Rolling walker   Gait Pattern Step-through pattern;Decreased stride length;Decreased step length - right;Decreased step length - left;Right flexed knee in stance;Left flexed knee in stance;Trunk flexed   Ambulation Surface Level;Indoor   High Level Balance   High Level Balance Activities Backward walking;Marching forwards;Side stepping   High Level Balance Comments in parallel bars with bil UE support: 2-3 laps each with min assist for balance, cues on posture and ex form.   Knee/Hip Exercises: Aerobic   Nustep x 4 extremities, level 4 x 8 minutes with goal >/= 45 steps per minute for strengthening and activity tolerance   Prosthetics   Current prosthetic wear tolerance (days/week)  7 days /wk   Current prosthetic wear tolerance (#hours/day)  all awake hours except during dialysis or bathing   Residual limb condition  intact per pt report   Education Provided  Residual limb care;Correct ply sock adjustment;Proper weight-bearing schedule/adjustment   Person(s) Educated Patient;Child(ren)   Education Method Explanation;Verbal cues   Education Method Verbalized understanding   Donning Prosthesis Supervision   Doffing Prosthesis Modified independent (device/increased time)            PT Short Term Goals - 02/14/15 0845    PT SHORT TERM GOAL #1   Title Patient verbalizes understanding of updated HEP to progress activity tolerance. (Target Date: 03/16/2015)   Time 4   Period Weeks   Status New   PT SHORT TERM GOAL #2   Title Patient ambulates 100' with RW & prostheses with supervision.  (Target Date: 03/16/2015)   Time 4   Period Weeks   Status New   PT SHORT TERM GOAL #3   Title Patient negotiates ramps & curbs with rolling walker & prostheses with moderate assist.  (Target Date: 03/16/2015)   Time 4   Period Weeks   Status New   PT SHORT TERM GOAL #4   Title Patient reaches 4" beyond length of his arm anteriorly and to ankle level towards ground with RW support with supervision.  (Target Date: 03/16/2015)   Time 4   Period Weeks   Status New           PT Long Term Goals - 02/28/15 1136    PT LONG TERM GOAL #1   Title Patient verbalizes understanding of ongoing HEP including [employer] to progress strength, flexibility, balance & endurance.  (Target Date: 04/14/2015)   Time 8   Period Weeks   Status On-going   PT LONG TERM GOAL #2   Title TImed Up & Go with rolling walker <45 seconds modified independent to indicate lower fall risk.  (Target Date: 04/14/2015)   Time 8   Period Weeks   Status On-going   PT LONG TERM GOAL #3   Title Patient ambulates 125' with RW & prostheses including negotiating around obstacles for limited community mobility.  (Target Date: 04/14/2015)   Time 8   Period Weeks   Status On-going   PT LONG TERM GOAL #4   Title Patient negotiates ramps & curbs with RW and stairs with 2 rails with minimal assist from family  to enable community access.  (Target Date: 04/14/2015)   Time 8   Period Weeks   Status On-going  PT LONG TERM GOAL #5   Title Patient reaches 5" all directions and to floor with rolling walker support safely modified independent.  (Target Date: 04/14/2015)   Time 8   Period Weeks   Status On-going               Plan - 03/02/15 0807    Clinical Impression Statement Skilled session focused on gait with prostheses/walker and strengthening/balance activities. Pt fatiqued quickly with balance activiites in parallel bars needing frequent rest breaks. Pt/daughter encouraged to increased short distance walking at home to assist with increasing pt's activity tolerance. Pt is making steady progress toward goals.    Pt will benefit from skilled therapeutic intervention in order to improve on the following deficits Abnormal gait;Decreased activity tolerance;Decreased balance;Decreased endurance;Decreased knowledge of use of DME;Decreased mobility;Decreased range of motion;Decreased strength;Prosthetic Dependency   Rehab Potential Good   Clinical Impairments Affecting Rehab Potential Dialysis limits strength and endurance with slow gains.    PT Frequency 2x / week   PT Duration 8 weeks   PT Treatment/Interventions ADLs/Self Care Home Management;DME Instruction;Gait training;Functional mobility training;Therapeutic activities;Therapeutic exercise;Balance training;Neuromuscular re-education;Patient/family education;Prosthetic Training;Passive range of motion;Stair training   PT Next Visit Plan continue with gaiit  training, balance and strengthening activities   Consulted and Agree with Plan of Care Patient   Family Member Consulted dtr, Cora        Problem List Patient Active Problem List   Diagnosis Date Noted  . Complications, amputation stump late ([place]) 05/20/2014  . Weakness generalized 04/07/2014  . Sepsis ([place]) 04/07/2014  . Decubitus ulcer, stage II 04/07/2014  . Decubitus ulcer of  ankle   . Fatigue   . Wound infection ([place]) 04/06/2014  . Decubitus ulcer of sacral region, stage 2 03/07/2014  . Glaucoma 03/07/2014  . Gout 03/07/2014  . Below knee amputation status ([place]) 02/18/2014  . Osteomyelitis ([place]) 01/04/2014  . Phantom limb ([place]) 12/14/2013  . Type 1 diabetes mellitus with diabetic foot infection ([place])   . Diabetes mellitus with peripheral vascular disease ([place])   . Asthma 05/17/2013  . History of MRSA infection 04/22/2013  . Peripheral vascular disease ([place]) 11/19/2012  . End-stage renal disease on hemodialysis ([place]) 05/05/2012  . Kahler disease ([place]) 03/30/2012  . Anemia in chronic kidney disease 03/19/2012  . Hypothyroidism 03/19/2012  . Anemia, iron deficiency 03/19/2012  . Hypertension 03/18/2012  . Chronic kidney disease (CKD), stage V ([place]) 03/18/2012  . Cardiac conduction disorder 03/18/2012  . MGUS (monoclonal gammopathy of unknown significance) 02/28/2011  . Monoclonal paraproteinemia 02/28/2011    Willow Ora 03/02/2015, 10:37 AM  Willow Ora, PTA, [facility] [address] [place], [place] [address] [phone] 03/02/2015, 10:37 AM   Name: Jack [name] MRN: [id number] Date of Birth: 01-Nov-1945

## 2015-03-07 ENCOUNTER — Encounter: Payer: Self-pay | Admitting: Physical Therapy

## 2015-03-07 ENCOUNTER — Ambulatory Visit: Payer: Medicare Other | Admitting: Physical Therapy

## 2015-03-07 DIAGNOSIS — Z4789 Encounter for other orthopedic aftercare: Secondary | ICD-10-CM

## 2015-03-07 DIAGNOSIS — R269 Unspecified abnormalities of gait and mobility: Secondary | ICD-10-CM

## 2015-03-07 DIAGNOSIS — R2689 Other abnormalities of gait and mobility: Secondary | ICD-10-CM

## 2015-03-07 DIAGNOSIS — Z89511 Acquired absence of right leg below knee: Secondary | ICD-10-CM

## 2015-03-07 DIAGNOSIS — M25669 Stiffness of unspecified knee, not elsewhere classified: Secondary | ICD-10-CM

## 2015-03-07 DIAGNOSIS — Z89512 Acquired absence of left leg below knee: Secondary | ICD-10-CM

## 2015-03-07 DIAGNOSIS — R6889 Other general symptoms and signs: Secondary | ICD-10-CM

## 2015-03-07 DIAGNOSIS — R2681 Unsteadiness on feet: Secondary | ICD-10-CM

## 2015-03-07 DIAGNOSIS — R29898 Other symptoms and signs involving the musculoskeletal system: Secondary | ICD-10-CM

## 2015-03-07 NOTE — Therapy (Signed)
[facility] [address] [place], [place], [address] Phone: [phone]   Fax:  [phone]  Physical Therapy Treatment  Patient Details  Name: Jack [name] MRN: [id number] Date of Birth: November 13, 1945 Referring Provider: Meridee Score, MD  Encounter Date: 03/07/2015      PT End of Session - 03/07/15 0845    Visit Number 5   Number of Visits 17   Date for PT Re-Evaluation 04/13/15   Authorization Type G-code & progress note every 10 visits   PT Start Time 0800   PT Stop Time 0844   PT Time Calculation (min) 44 min   Equipment Utilized During Treatment Gait belt   Activity Tolerance Patient tolerated treatment well;Patient limited by fatigue   Behavior During Therapy [facility] for tasks assessed/performed      Past Medical History  Diagnosis Date  . Hypertension   . Pneumonia     2012  . Heart murmur   . Glaucoma   . Multiple myeloma, without mention of having achieved remission 03/30/2012    Cytogenetic neg on 03/23/2012.  . Asthma   . Hyperparathyroidism, secondary renal ([place])   . Peripheral arterial disease ([place])   . End-stage renal disease on hemodialysis [facility])     Started HD March 2014.  Cause of ESRD was DM.  Gets HD at [facility] on [address] on MWF schedule.   . Thyroid disease     hyperparathyroidism  . MRSA bacteremia   . Anemia   . Peripheral vascular disease, unspecified ([place]) 11/19/2012    In the past had R foot toe amps then R TMA. In 2015 had left foot toe amp > then TMA >then L BKA on 05/14/13   . History of MRSA infection 04/22/2013    Bacteremia assoc w L foot wound infection Mar 2015   . Gangrene of foot ([place])   . ESRD on hemodialysis ([place])   . Diabetes mellitus with peripheral vascular disease ([place])   . Hypothyroidism   . Decubitus ulcer of sacral region, stage 2 03/07/2014  . Glaucoma 03/07/2014  . Gangrene ([place])     right BKA  . Contracture of joint     left knee    Past Surgical History   Procedure Laterality Date  . Thyroidectomy    . Cervical disc surgery    . Insertion of dialysis catheter Right 03/19/2012    Procedure: INSERTION OF DIALYSIS CATHETER;  Surgeon: Mal Misty, MD;  Location: [place];  Service: Vascular;  Laterality: Right;  Right Internal Jugular  . Av fistula placement Left 03/25/2012    Procedure: ARTERIOVENOUS (AV) FISTULA CREATION;  Surgeon: Mal Misty, MD;  Location: [place];  Service: Vascular;  Laterality: Left;  . Ligation of competing branches of arteriovenous fistula Left 05/08/2012    Procedure: LIGATION OF COMPETING BRANCHES OF ARTERIOVENOUS FISTULA;  Surgeon: Mal Misty, MD;  Location: [place];  Service: Vascular;  Laterality: Left;  Ultrasound guided  . Cardiac catheterization      approx 30 years ago  . Amputation Right 11/10/2012    Procedure: AMPUTATION FIRST and SECOND TOES Right Foot;  Surgeon: Elam Dutch, MD;  Location: [facility] OR;  Service: Vascular;  Laterality: Right;  . Transmetatarsal amputation Left 12/16/2012    Procedure: TRANSMETATARSAL AMPUTATION AND VAC PLACEMENT;  Surgeon: Elam Dutch, MD;  Location: [place];  Service: Vascular;  Laterality: Left;  . Amputation Left 04/07/2013    Procedure: AMPUTATION DIGIT- LEFT 1ST TOE;  Surgeon: Mal Misty, MD;  Location: [place];  Service: Vascular;  Laterality: Left;  . Tee without cardioversion N/A 04/20/2013    Procedure: TRANSESOPHAGEAL ECHOCARDIOGRAM (TEE);  Surgeon: Josue Hector, MD;  Location: [facility] ENDOSCOPY;  Service: Cardiovascular;  Laterality: N/A;  . I&d extremity Left 04/22/2013    Procedure: [id number] LNL DEBRIDEMENT LEFT FIRST TOE AMPUTATION WOUND ;  Surgeon: Mal Misty, MD;  Location: [place];  Service: Vascular;  Laterality: Left;  . Amputation Left 04/26/2013    Procedure: Left Foot Transmetatarsal Amputation;  Surgeon: Newt Minion, MD;  Location: [place];  Service: Orthopedics;  Laterality: Left;  . Toe amputation      D/C 04-30-13  . Below knee leg amputation Left  05/14/2013    DR [name]  . Amputation Left 05/14/2013    Procedure: AMPUTATION BELOW KNEE;  Surgeon: Newt Minion, MD;  Location: [place];  Service: Orthopedics;  Laterality: Left;  Left Below Knee Amputation  . Eye surgery Bilateral     CATARACTS  . Amputation Left 07/23/2013    Procedure: AMPUTATION BELOW KNEE;  Surgeon: Newt Minion, MD;  Location: [place];  Service: Orthopedics;  Laterality: Left;  Left Below Knee Amputation Revision  . Abdominal aortagram Bilateral 11/06/2012    Procedure: ABDOMINAL AORTAGRAM;  Surgeon: Elam Dutch, MD;  Location: [facility] CATH LAB;  Service: Cardiovascular;  Laterality: Bilateral;  . I&d extremity Right 01/05/2014    Procedure: IRRIGATION AND DEBRIDEMENT Right Heel Ulcer;  Surgeon: Mcarthur Rossetti, MD;  Location: [place];  Service: Orthopedics;  Laterality: Right;  Surgeon Available after 5PM  . Below knee leg amputation Right 02/18/2014    dr duda  . Amputation Right 02/18/2014    Procedure: AMPUTATION BELOW KNEE;  Surgeon: Newt Minion, MD;  Location: [place];  Service: Orthopedics;  Laterality: Right;  . Stump revision Right 05/20/2014    Procedure: Revision Right Below Knee Amputation;  Surgeon: Newt Minion, MD;  Location: [place];  Service: Orthopedics;  Laterality: Right;  . Repair quadriceps/hamstring muscles Left 05/20/2014    Procedure: Left Hamstring Release;  Surgeon: Newt Minion, MD;  Location: [place];  Service: Orthopedics;  Laterality: Left;    There were no vitals filed for this visit.  Visit Diagnosis:  Weakness of both legs  Abnormality of gait  Decreased functional activity tolerance  Encounter for prosthetic gait training  Unsteadiness  Balance problems  Status post bilateral below knee amputation (HCC)  Decreased ROM of lower extremity      Subjective Assessment - 03/07/15 0803    Subjective Rash continues to clear up but it itches a lot.    Currently in Pain? No/denies     Prosthetic training with bilateral Transtibial  Amputation prostheses Patient ambulated 61' with RW & prostheses with supervision with cues on posture, step length and increasing activity level outside of PT. Patient negotiated ramp with RW with minA / cues on technique including posture, wt shift and step length.  Patient negotiated curb (2 reps) with PT demo/ instructing prior & during in technique including foot position  & wt shift.  Sit to /from stand from 24" bar stool with focus on minimal support from UEs. 5 reps.                             PT Education - 03/07/15 0845    Education provided Yes   Education Details increasing activity  level with higher frequency of walks in home & enter/exiting home/community with family following with w/c; how to safely follow with w/c and maintain supervision.    Person(s) Educated Patient;Child(ren)   Methods Explanation;Demonstration;Verbal cues   Comprehension Verbalized understanding;Verbal cues required          PT Short Term Goals - 02/14/15 0845    PT SHORT TERM GOAL #1   Title Patient verbalizes understanding of updated HEP to progress activity tolerance. (Target Date: 03/16/2015)   Time 4   Period Weeks   Status New   PT SHORT TERM GOAL #2   Title Patient ambulates 100' with RW & prostheses with supervision.  (Target Date: 03/16/2015)   Time 4   Period Weeks   Status New   PT SHORT TERM GOAL #3   Title Patient negotiates ramps & curbs with rolling walker & prostheses with moderate assist.  (Target Date: 03/16/2015)   Time 4   Period Weeks   Status New   PT SHORT TERM GOAL #4   Title Patient reaches 4" beyond length of his arm anteriorly and to ankle level towards ground with RW support with supervision.  (Target Date: 03/16/2015)   Time 4   Period Weeks   Status New           PT Long Term Goals - 02/28/15 1136    PT LONG TERM GOAL #1   Title Patient verbalizes understanding of ongoing HEP including [employer] to progress strength, flexibility, balance &  endurance.  (Target Date: 04/14/2015)   Time 8   Period Weeks   Status On-going   PT LONG TERM GOAL #2   Title TImed Up & Go with rolling walker <45 seconds modified independent to indicate lower fall risk.  (Target Date: 04/14/2015)   Time 8   Period Weeks   Status On-going   PT LONG TERM GOAL #3   Title Patient ambulates 125' with RW & prostheses including negotiating around obstacles for limited community mobility.  (Target Date: 04/14/2015)   Time 8   Period Weeks   Status On-going   PT LONG TERM GOAL #4   Title Patient negotiates ramps & curbs with RW and stairs with 2 rails with minimal assist from family to enable community access.  (Target Date: 04/14/2015)   Time 8   Period Weeks   Status On-going   PT LONG TERM GOAL #5   Title Patient reaches 5" all directions and to floor with rolling walker support safely modified independent.  (Target Date: 04/14/2015)   Time 8   Period Weeks   Status On-going               Plan - 03/07/15 0845    Clinical Impression Statement Patient seems to understand increasing activity level first with frequency over longer distances. patient improved technique on curbs with skilled instructions and repetition.    Pt will benefit from skilled therapeutic intervention in order to improve on the following deficits Abnormal gait;Decreased activity tolerance;Decreased balance;Decreased endurance;Decreased knowledge of use of DME;Decreased mobility;Decreased range of motion;Decreased strength;Prosthetic Dependency   Rehab Potential Good   Clinical Impairments Affecting Rehab Potential Dialysis limits strength and endurance with slow gains.    PT Frequency 2x / week   PT Duration 8 weeks   PT Treatment/Interventions ADLs/Self Care Home Management;DME Instruction;Gait training;Functional mobility training;Therapeutic activities;Therapeutic exercise;Balance training;Neuromuscular re-education;Patient/family education;Prosthetic Training;Passive range of  motion;Stair training   PT Next Visit Plan continue with gaiit  training, balance and strengthening activities  Consulted and Agree with Plan of Care Patient   Family Member Consulted dtr, Cora        Problem List Patient Active Problem List   Diagnosis Date Noted  . Complications, amputation stump late ([place]) 05/20/2014  . Weakness generalized 04/07/2014  . Sepsis ([place]) 04/07/2014  . Decubitus ulcer, stage II 04/07/2014  . Decubitus ulcer of ankle   . Fatigue   . Wound infection ([place]) 04/06/2014  . Decubitus ulcer of sacral region, stage 2 03/07/2014  . Glaucoma 03/07/2014  . Gout 03/07/2014  . Below knee amputation status ([place]) 02/18/2014  . Osteomyelitis ([place]) 01/04/2014  . Phantom limb ([place]) 12/14/2013  . Type 1 diabetes mellitus with diabetic foot infection ([place])   . Diabetes mellitus with peripheral vascular disease ([place])   . Asthma 05/17/2013  . History of MRSA infection 04/22/2013  . Peripheral vascular disease ([place]) 11/19/2012  . End-stage renal disease on hemodialysis ([place]) 05/05/2012  . Kahler disease ([place]) 03/30/2012  . Anemia in chronic kidney disease 03/19/2012  . Hypothyroidism 03/19/2012  . Anemia, iron deficiency 03/19/2012  . Hypertension 03/18/2012  . Chronic kidney disease (CKD), stage V ([place]) 03/18/2012  . Cardiac conduction disorder 03/18/2012  . MGUS (monoclonal gammopathy of unknown significance) 02/28/2011  . Monoclonal paraproteinemia 02/28/2011    Jamey Reas PT, DPT 03/07/2015, 11:16 AM  [facility] [address] [place], [place], [address] Phone: [phone]   Fax:  [phone]  Name: Jack [name] MRN: [id number] Date of Birth: 11-24-1945

## 2015-03-09 ENCOUNTER — Ambulatory Visit: Payer: Medicare Other | Admitting: Physical Therapy

## 2015-03-09 ENCOUNTER — Encounter: Payer: Self-pay | Admitting: Physical Therapy

## 2015-03-09 DIAGNOSIS — R29898 Other symptoms and signs involving the musculoskeletal system: Secondary | ICD-10-CM

## 2015-03-09 DIAGNOSIS — Z4789 Encounter for other orthopedic aftercare: Secondary | ICD-10-CM

## 2015-03-09 DIAGNOSIS — Z89511 Acquired absence of right leg below knee: Secondary | ICD-10-CM

## 2015-03-09 DIAGNOSIS — R2681 Unsteadiness on feet: Secondary | ICD-10-CM

## 2015-03-09 DIAGNOSIS — R2689 Other abnormalities of gait and mobility: Secondary | ICD-10-CM

## 2015-03-09 DIAGNOSIS — Z89512 Acquired absence of left leg below knee: Secondary | ICD-10-CM

## 2015-03-09 DIAGNOSIS — R6889 Other general symptoms and signs: Secondary | ICD-10-CM

## 2015-03-09 DIAGNOSIS — R269 Unspecified abnormalities of gait and mobility: Secondary | ICD-10-CM

## 2015-03-12 NOTE — Therapy (Signed)
[facility] [address] [place], [place], [address] Phone: [phone]   Fax:  [phone]  Physical Therapy Treatment  Patient Details  Name: Jack [name] MRN: [id number] Date of Birth: 08-15-1945 Referring Provider: Meridee Score, MD  Encounter Date: 03/09/2015     03/09/15 5681  PT Visits / Re-Eval  Visit Number 6  Number of Visits 17  Date for PT Re-Evaluation 04/13/15  Authorization  Authorization Type G-code & progress note every 10 visits  PT Time Calculation  PT Start Time 0805  PT Stop Time 0845  PT Time Calculation (min) 40 min  PT - End of Session  Equipment Utilized During Treatment Gait belt  Activity Tolerance Patient tolerated treatment well;Patient limited by fatigue  Behavior During Therapy [facility] for tasks assessed/performed    Past Medical History  Diagnosis Date  . Hypertension   . Pneumonia     2012  . Heart murmur   . Glaucoma   . Multiple myeloma, without mention of having achieved remission 03/30/2012    Cytogenetic neg on 03/23/2012.  . Asthma   . Hyperparathyroidism, secondary renal ([place])   . Peripheral arterial disease ([place])   . End-stage renal disease on hemodialysis [facility])     Started HD March 2014.  Cause of ESRD was DM.  Gets HD at [facility] on [address] on MWF schedule.   . Thyroid disease     hyperparathyroidism  . MRSA bacteremia   . Anemia   . Peripheral vascular disease, unspecified ([place]) 11/19/2012    In the past had R foot toe amps then R TMA. In 2015 had left foot toe amp > then TMA >then L BKA on 05/14/13   . History of MRSA infection 04/22/2013    Bacteremia assoc w L foot wound infection Mar 2015   . Gangrene of foot ([place])   . ESRD on hemodialysis ([place])   . Diabetes mellitus with peripheral vascular disease ([place])   . Hypothyroidism   . Decubitus ulcer of sacral region, stage 2 03/07/2014  . Glaucoma 03/07/2014  . Gangrene ([place])     right BKA  . Contracture of  joint     left knee    Past Surgical History  Procedure Laterality Date  . Thyroidectomy    . Cervical disc surgery    . Insertion of dialysis catheter Right 03/19/2012    Procedure: INSERTION OF DIALYSIS CATHETER;  Surgeon: Mal Misty, MD;  Location: [place];  Service: Vascular;  Laterality: Right;  Right Internal Jugular  . Av fistula placement Left 03/25/2012    Procedure: ARTERIOVENOUS (AV) FISTULA CREATION;  Surgeon: Mal Misty, MD;  Location: [place];  Service: Vascular;  Laterality: Left;  . Ligation of competing branches of arteriovenous fistula Left 05/08/2012    Procedure: LIGATION OF COMPETING BRANCHES OF ARTERIOVENOUS FISTULA;  Surgeon: Mal Misty, MD;  Location: [place];  Service: Vascular;  Laterality: Left;  Ultrasound guided  . Cardiac catheterization      approx 30 years ago  . Amputation Right 11/10/2012    Procedure: AMPUTATION FIRST and SECOND TOES Right Foot;  Surgeon: Elam Dutch, MD;  Location: [facility] OR;  Service: Vascular;  Laterality: Right;  . Transmetatarsal amputation Left 12/16/2012    Procedure: TRANSMETATARSAL AMPUTATION AND VAC PLACEMENT;  Surgeon: Elam Dutch, MD;  Location: [place];  Service: Vascular;  Laterality: Left;  . Amputation Left 04/07/2013    Procedure: AMPUTATION DIGIT- LEFT 1ST TOE;  Surgeon:  Mal Misty, MD;  Location: [place];  Service: Vascular;  Laterality: Left;  . Tee without cardioversion N/A 04/20/2013    Procedure: TRANSESOPHAGEAL ECHOCARDIOGRAM (TEE);  Surgeon: Josue Hector, MD;  Location: [facility] ENDOSCOPY;  Service: Cardiovascular;  Laterality: N/A;  . I&d extremity Left 04/22/2013    Procedure: [id number] RSW DEBRIDEMENT LEFT FIRST TOE AMPUTATION WOUND ;  Surgeon: Mal Misty, MD;  Location: [place];  Service: Vascular;  Laterality: Left;  . Amputation Left 04/26/2013    Procedure: Left Foot Transmetatarsal Amputation;  Surgeon: Newt Minion, MD;  Location: [place];  Service: Orthopedics;  Laterality: Left;  . Toe amputation       D/C 04-30-13  . Below knee leg amputation Left 05/14/2013    DR [name]  . Amputation Left 05/14/2013    Procedure: AMPUTATION BELOW KNEE;  Surgeon: Newt Minion, MD;  Location: [place];  Service: Orthopedics;  Laterality: Left;  Left Below Knee Amputation  . Eye surgery Bilateral     CATARACTS  . Amputation Left 07/23/2013    Procedure: AMPUTATION BELOW KNEE;  Surgeon: Newt Minion, MD;  Location: [place];  Service: Orthopedics;  Laterality: Left;  Left Below Knee Amputation Revision  . Abdominal aortagram Bilateral 11/06/2012    Procedure: ABDOMINAL AORTAGRAM;  Surgeon: Elam Dutch, MD;  Location: [facility] CATH LAB;  Service: Cardiovascular;  Laterality: Bilateral;  . I&d extremity Right 01/05/2014    Procedure: IRRIGATION AND DEBRIDEMENT Right Heel Ulcer;  Surgeon: Mcarthur Rossetti, MD;  Location: [place];  Service: Orthopedics;  Laterality: Right;  Surgeon Available after 5PM  . Below knee leg amputation Right 02/18/2014    dr duda  . Amputation Right 02/18/2014    Procedure: AMPUTATION BELOW KNEE;  Surgeon: Newt Minion, MD;  Location: [place];  Service: Orthopedics;  Laterality: Right;  . Stump revision Right 05/20/2014    Procedure: Revision Right Below Knee Amputation;  Surgeon: Newt Minion, MD;  Location: [place];  Service: Orthopedics;  Laterality: Right;  . Repair quadriceps/hamstring muscles Left 05/20/2014    Procedure: Left Hamstring Release;  Surgeon: Newt Minion, MD;  Location: [place];  Service: Orthopedics;  Laterality: Left;    There were no vitals filed for this visit.  Visit Diagnosis:  Weakness of both legs  Abnormality of gait  Decreased functional activity tolerance  Encounter for prosthetic gait training  Unsteadiness  Balance problems  Status post bilateral below knee amputation ([place])   03/09/15 0809  Symptoms/Limitations  Subjective No new complaints. Rash is clearing up, not iching as much. No falls or pain to report. Trying to walk more at home, however  yesterday was not able to due to fatigue at dialysis.  Patient is accompained by: Family member (daughter)  Pertinent History ESRD, Bil. Transtibial Amputations, HTN, Type 1 DM, asthma, multiple myelomas, hyperparathyroidism, glaucoma, gout, PVD  Patient Stated Goals He wants to walk more and get out of w/c.   Pain Assessment  Currently in Pain? No/denies  Pain Score 0       03/09/15 0812  Transfers  Transfers Sit to Stand;Stand to Sit;Squat Pivot Transfers  Sit to Stand 4: Min assist;With upper extremity assist;From chair/3-in-1;With armrests  Sit to Stand Details Verbal cues for precautions/safety;Verbal cues for technique;Verbal cues for sequencing  Stand to Sit 4: Min assist;With upper extremity assist;With armrests;To chair/3-in-1;Uncontrolled descent  Stand to Sit Details (indicate cue type and reason) Verbal cues for technique;Verbal cues for precautions/safety  Ambulation/Gait  Ambulation/Gait Yes  Ambulation/Gait Assistance 4: Min guard;4: Min assist  Ambulation/Gait Assistance Details cues on posture, walker position with gait and to correct stated gait deviaitons  Ambulation Distance (Feet) 120 Feet (1, 70 x1, 30 x1)  Assistive device Prostheses;Rolling walker  Gait Pattern Step-through pattern;Decreased stride length;Decreased step length - right;Decreased step length - left;Right flexed knee in stance;Left flexed knee in stance;Trunk flexed  Ambulation Surface Level;Indoor  Ramp 4: Min assist (walker/prostheses)  Ramp Details (indicate cue type and reason) cues on posture, knee extension and for hand placement on walker when on ramp  Curb 4: Min assist (walker/prostheses)  Curb Details (indicate cue type and reason) cues on posture, stance position with walker advancement and on technique with descending curb  Knee/Hip Exercises: Aerobic  Nustep x 4 extremities, level 4 x 8 minutes with goal >/= 45 steps per minute for strengthening and activity tolerance  Prosthetics   Current prosthetic wear tolerance (days/week)  7 days /wk  Current prosthetic wear tolerance (#hours/day)  all awake hours except during dialysis or bathing  Residual limb condition  intact per pt report  Donning Prosthesis 5  Doffing Prosthesis 6          PT Short Term Goals - 02/14/15 0845    PT SHORT TERM GOAL #1   Title Patient verbalizes understanding of updated HEP to progress activity tolerance. (Target Date: 03/16/2015)   Time 4   Period Weeks   Status New   PT SHORT TERM GOAL #2   Title Patient ambulates 100' with RW & prostheses with supervision.  (Target Date: 03/16/2015)   Time 4   Period Weeks   Status New   PT SHORT TERM GOAL #3   Title Patient negotiates ramps & curbs with rolling walker & prostheses with moderate assist.  (Target Date: 03/16/2015)   Time 4   Period Weeks   Status New   PT SHORT TERM GOAL #4   Title Patient reaches 4" beyond length of his arm anteriorly and to ankle level towards ground with RW support with supervision.  (Target Date: 03/16/2015)   Time 4   Period Weeks   Status New           PT Long Term Goals - 02/28/15 1136    PT LONG TERM GOAL #1   Title Patient verbalizes understanding of ongoing HEP including [employer] to progress strength, flexibility, balance & endurance.  (Target Date: 04/14/2015)   Time 8   Period Weeks   Status On-going   PT LONG TERM GOAL #2   Title TImed Up & Go with rolling walker <45 seconds modified independent to indicate lower fall risk.  (Target Date: 04/14/2015)   Time 8   Period Weeks   Status On-going   PT LONG TERM GOAL #3   Title Patient ambulates 125' with RW & prostheses including negotiating around obstacles for limited community mobility.  (Target Date: 04/14/2015)   Time 8   Period Weeks   Status On-going   PT LONG TERM GOAL #4   Title Patient negotiates ramps & curbs with RW and stairs with 2 rails with minimal assist from family to enable community access.  (Target Date: 04/14/2015)   Time 8    Period Weeks   Status On-going   PT LONG TERM GOAL #5   Title Patient reaches 5" all directions and to floor with rolling walker support safely modified independent.  (Target Date: 04/14/2015)   Time 8   Period Weeks   Status  On-going        03/09/15 1610  Plan  Clinical Impression Statement Skilled session continued to address gait with prostheses/walker and strengthening towards goals. Pt is making steady progress. Reinforced need for increased activity at home for maximal gains in therapy.  Pt will benefit from skilled therapeutic intervention in order to improve on the following deficits Abnormal gait;Decreased activity tolerance;Decreased balance;Decreased endurance;Decreased knowledge of use of DME;Decreased mobility;Decreased range of motion;Decreased strength;Prosthetic Dependency  Rehab Potential Good  Clinical Impairments Affecting Rehab Potential Dialysis limits strength and endurance with slow gains.   PT Frequency 2x / week  PT Duration 8 weeks  PT Treatment/Interventions ADLs/Self Care Home Management;DME Instruction;Gait training;Functional mobility training;Therapeutic activities;Therapeutic exercise;Balance training;Neuromuscular re-education;Patient/family education;Prosthetic Training;Passive range of motion;Stair training  PT Next Visit Plan continue with gaiit  training, balance and strengthening activities  Consulted and Agree with Plan of Care Patient  Family Member Consulted dtr, Cora      Problem List Patient Active Problem List   Diagnosis Date Noted  . Complications, amputation stump late ([place]) 05/20/2014  . Weakness generalized 04/07/2014  . Sepsis ([place]) 04/07/2014  . Decubitus ulcer, stage II 04/07/2014  . Decubitus ulcer of ankle   . Fatigue   . Wound infection ([place]) 04/06/2014  . Decubitus ulcer of sacral region, stage 2 03/07/2014  . Glaucoma 03/07/2014  . Gout 03/07/2014  . Below knee amputation status ([place]) 02/18/2014  . Osteomyelitis ([place])  01/04/2014  . Phantom limb ([place]) 12/14/2013  . Type 1 diabetes mellitus with diabetic foot infection ([place])   . Diabetes mellitus with peripheral vascular disease ([place])   . Asthma 05/17/2013  . History of MRSA infection 04/22/2013  . Peripheral vascular disease ([place]) 11/19/2012  . End-stage renal disease on hemodialysis ([place]) 05/05/2012  . Kahler disease ([place]) 03/30/2012  . Anemia in chronic kidney disease 03/19/2012  . Hypothyroidism 03/19/2012  . Anemia, iron deficiency 03/19/2012  . Hypertension 03/18/2012  . Chronic kidney disease (CKD), stage V ([place]) 03/18/2012  . Cardiac conduction disorder 03/18/2012  . MGUS (monoclonal gammopathy of unknown significance) 02/28/2011  . Monoclonal paraproteinemia 02/28/2011    Willow Ora 03/12/2015, 9:59 PM  Willow Ora, PTA, [facility] [address], [place], [place] [address] [phone] 03/12/2015, 10:02 PM   Name: Jack [name] MRN: [id number] Date of Birth: Oct 27, 1945

## 2015-03-14 ENCOUNTER — Encounter: Payer: Self-pay | Admitting: Physical Therapy

## 2015-03-14 ENCOUNTER — Ambulatory Visit: Payer: Medicare Other | Admitting: Physical Therapy

## 2015-03-14 DIAGNOSIS — R6889 Other general symptoms and signs: Secondary | ICD-10-CM

## 2015-03-14 DIAGNOSIS — R29898 Other symptoms and signs involving the musculoskeletal system: Secondary | ICD-10-CM

## 2015-03-14 DIAGNOSIS — R2689 Other abnormalities of gait and mobility: Secondary | ICD-10-CM

## 2015-03-14 DIAGNOSIS — R2681 Unsteadiness on feet: Secondary | ICD-10-CM

## 2015-03-14 DIAGNOSIS — R269 Unspecified abnormalities of gait and mobility: Secondary | ICD-10-CM

## 2015-03-14 NOTE — Therapy (Signed)
[facility] [address] [place], [place], [address] Phone: [phone]   Fax:  [phone]  Physical Therapy Treatment  Patient Details  Name: Jack [name] MRN: [id number] Date of Birth: [birth year] Referring Provider: Meridee Score, MD  Encounter Date: 03/14/2015      PT End of Session - 03/14/15 0859    Visit Number 7   Number of Visits 17   Date for PT Re-Evaluation 04/13/15   Authorization Type G-code & progress note every 10 visits   PT Start Time 0801   PT Stop Time 0846   PT Time Calculation (min) 45 min   Equipment Utilized During Treatment Gait belt   Activity Tolerance Patient tolerated treatment well;Patient limited by fatigue   Behavior During Therapy [facility] for tasks assessed/performed      Past Medical History  Diagnosis Date  . Hypertension   . Pneumonia     2012  . Heart murmur   . Glaucoma   . Multiple myeloma, without mention of having achieved remission 03/30/2012    Cytogenetic neg on 03/23/2012.  . Asthma   . Hyperparathyroidism, secondary renal ([place])   . Peripheral arterial disease ([place])   . End-stage renal disease on hemodialysis [facility])     Started HD March 2014.  Cause of ESRD was DM.  Gets HD at [facility] on [place] on MWF schedule.   . Thyroid disease     hyperparathyroidism  . MRSA bacteremia   . Anemia   . Peripheral vascular disease, unspecified ([place]) 11/19/2012    In the past had R foot toe amps then R TMA. In 2015 had left foot toe amp > then TMA >then L BKA on 05/14/13   . History of MRSA infection 04/22/2013    Bacteremia assoc w L foot wound infection Mar 2015   . Gangrene of foot ([place])   . ESRD on hemodialysis ([place])   . Diabetes mellitus with peripheral vascular disease ([place])   . Hypothyroidism   . Decubitus ulcer of sacral region, stage 2 03/07/2014  . Glaucoma 03/07/2014  . Gangrene ([place])     right BKA  . Contracture of joint     left knee    Past Surgical History   Procedure Laterality Date  . Thyroidectomy    . Cervical disc surgery    . Insertion of dialysis catheter Right 03/19/2012    Procedure: INSERTION OF DIALYSIS CATHETER;  Surgeon: Mal Misty, MD;  Location: [place];  Service: Vascular;  Laterality: Right;  Right Internal Jugular  . Av fistula placement Left 03/25/2012    Procedure: ARTERIOVENOUS (AV) FISTULA CREATION;  Surgeon: Mal Misty, MD;  Location: [place];  Service: Vascular;  Laterality: Left;  . Ligation of competing branches of arteriovenous fistula Left 05/08/2012    Procedure: LIGATION OF COMPETING BRANCHES OF ARTERIOVENOUS FISTULA;  Surgeon: Mal Misty, MD;  Location: [place];  Service: Vascular;  Laterality: Left;  Ultrasound guided  . Cardiac catheterization      approx 30 years ago  . Amputation Right 11/10/2012    Procedure: AMPUTATION FIRST and SECOND TOES Right Foot;  Surgeon: Elam Dutch, MD;  Location: [facility] OR;  Service: Vascular;  Laterality: Right;  . Transmetatarsal amputation Left 12/16/2012    Procedure: TRANSMETATARSAL AMPUTATION AND VAC PLACEMENT;  Surgeon: Elam Dutch, MD;  Location: [place];  Service: Vascular;  Laterality: Left;  . Amputation Left 04/07/2013    Procedure: AMPUTATION DIGIT- LEFT 1ST TOE;  Surgeon: Mal Misty, MD;  Location: [place];  Service: Vascular;  Laterality: Left;  . Tee without cardioversion N/A 04/20/2013    Procedure: TRANSESOPHAGEAL ECHOCARDIOGRAM (TEE);  Surgeon: Josue [place], MD;  Location: [facility] ENDOSCOPY;  Service: Cardiovascular;  Laterality: N/A;  . I&d extremity Left 04/22/2013    Procedure: JIRCVELFYB OFB DEBRIDEMENT LEFT FIRST TOE AMPUTATION WOUND ;  Surgeon: Mal Misty, MD;  Location: [place];  Service: Vascular;  Laterality: Left;  . Amputation Left 04/26/2013    Procedure: Left Foot Transmetatarsal Amputation;  Surgeon: Newt Minion, MD;  Location: [place];  Service: Orthopedics;  Laterality: Left;  . Toe amputation      D/C 04-30-13  . Below knee leg amputation Left  05/14/2013    DR [name]  . Amputation Left 05/14/2013    Procedure: AMPUTATION BELOW KNEE;  Surgeon: Newt Minion, MD;  Location: [place];  Service: Orthopedics;  Laterality: Left;  Left Below Knee Amputation  . Eye surgery Bilateral     CATARACTS  . Amputation Left 07/23/2013    Procedure: AMPUTATION BELOW KNEE;  Surgeon: Newt Minion, MD;  Location: [place];  Service: Orthopedics;  Laterality: Left;  Left Below Knee Amputation Revision  . Abdominal aortagram Bilateral 11/06/2012    Procedure: ABDOMINAL AORTAGRAM;  Surgeon: Elam Dutch, MD;  Location: [facility] CATH LAB;  Service: Cardiovascular;  Laterality: Bilateral;  . I&d extremity Right 01/05/2014    Procedure: IRRIGATION AND DEBRIDEMENT Right Heel Ulcer;  Surgeon: Mcarthur Rossetti, MD;  Location: [place];  Service: Orthopedics;  Laterality: Right;  Surgeon Available after 5PM  . Below knee leg amputation Right 02/18/2014    dr duda  . Amputation Right 02/18/2014    Procedure: AMPUTATION BELOW KNEE;  Surgeon: Newt Minion, MD;  Location: [place];  Service: Orthopedics;  Laterality: Right;  . Stump revision Right 05/20/2014    Procedure: Revision Right Below Knee Amputation;  Surgeon: Newt Minion, MD;  Location: [place];  Service: Orthopedics;  Laterality: Right;  . Repair quadriceps/hamstring muscles Left 05/20/2014    Procedure: Left Hamstring Release;  Surgeon: Newt Minion, MD;  Location: [place];  Service: Orthopedics;  Laterality: Left;    There were no vitals filed for this visit.  Visit Diagnosis:  Abnormality of gait  Unsteadiness  Balance problems  Activity intolerance  Weakness of both legs      Subjective Assessment - 03/14/15 0803    Subjective No new complaints. Rash is clearing up, not iching as much. No falls or pain to report. Patient states he is walking more at home and has signed up for the recommended gym membership.   Patient is accompained by: Family member  daughter   Pertinent History ESRD, Bil. Transtibial  Amputations, HTN, Type 1 DM, asthma, multiple myelomas, hyperparathyroidism, glaucoma, gout, PVD   Patient Stated Goals He wants to walk more and get out of w/c.    Currently in Pain? No/denies          [facility] Adult PT Treatment/Exercise - 03/14/15 0852    Ambulation/Gait   Ambulation/Gait Yes   Ambulation/Gait Assistance 4: Min guard;4: Min assist   Ambulation Distance (Feet) 120 Feet   Assistive device Rolling walker;Prostheses   Gait Pattern Step-through pattern;Decreased stride length;Decreased step length - right;Decreased step length - left;Right flexed knee in stance;Left flexed knee in stance;Trunk flexed   Neuro Re-ed    Neuro Re-ed Details  // Bars standing no UE support, single UE  support alternating UE raises; ball toss and balloon tapping activity from stool to decrease fall risk and increase balance necessary for safe ambulation.    Exercises   Exercises Other Exercises   Other Exercises  Core strengthening exercises on stool with lean back, lean forward, lean laterally alternating, core rotation for 1 set of 10 reps each, as well as weighted ball chest pass on stool to failure to increase core strength necessary for safe transfers and mobility.          PT Education - 03/14/15 0858    Education provided Yes   Education Details Discussed benefits of gym membership with patient. Patient states he has taken steps to join local [employer] to increase daily activity and maximize therapy gains.   Person(s) Educated Patient;Child(ren)   Methods Explanation   Comprehension Verbalized understanding          PT Short Term Goals - 02/14/15 0845    PT SHORT TERM GOAL #1   Title Patient verbalizes understanding of updated HEP to progress activity tolerance. (Target Date: 03/16/2015)   Time 4   Period Weeks   Status New   PT SHORT TERM GOAL #2   Title Patient ambulates 100' with RW & prostheses with supervision.  (Target Date: 03/16/2015)   Time 4   Period Weeks   Status New   PT  SHORT TERM GOAL #3   Title Patient negotiates ramps & curbs with rolling walker & prostheses with moderate assist.  (Target Date: 03/16/2015)   Time 4   Period Weeks   Status New   PT SHORT TERM GOAL #4   Title Patient reaches 4" beyond length of his arm anteriorly and to ankle level towards ground with RW support with supervision.  (Target Date: 03/16/2015)   Time 4   Period Weeks   Status New           PT Long Term Goals - 02/28/15 1136    PT LONG TERM GOAL #1   Title Patient verbalizes understanding of ongoing HEP including [employer] to progress strength, flexibility, balance & endurance.  (Target Date: 04/14/2015)   Time 8   Period Weeks   Status On-going   PT LONG TERM GOAL #2   Title TImed Up & Go with rolling walker <45 seconds modified independent to indicate lower fall risk.  (Target Date: 04/14/2015)   Time 8   Period Weeks   Status On-going   PT LONG TERM GOAL #3   Title Patient ambulates 125' with RW & prostheses including negotiating around obstacles for limited community mobility.  (Target Date: 04/14/2015)   Time 8   Period Weeks   Status On-going   PT LONG TERM GOAL #4   Title Patient negotiates ramps & curbs with RW and stairs with 2 rails with minimal assist from family to enable community access.  (Target Date: 04/14/2015)   Time 8   Period Weeks   Status On-going   PT LONG TERM GOAL #5   Title Patient reaches 5" all directions and to floor with rolling walker support safely modified independent.  (Target Date: 04/14/2015)   Time 8   Period Weeks   Status On-going          Plan - 03/14/15 0900    Clinical Impression Statement Skilled session addressing deficits in gait, balance, and core strength. Patient able to complete full round on track for gait without fatigue. Patient expressed increased comfort challenging balance within session. Patient has joined Biomedical [employer]  to maximize therapy gains and is making progress toward goals.   Pt will benefit from skilled  therapeutic intervention in order to improve on the following deficits Abnormal gait;Decreased activity tolerance;Decreased balance;Decreased endurance;Decreased knowledge of use of DME;Decreased mobility;Decreased range of motion;Decreased strength;Prosthetic Dependency   Rehab Potential Good   Clinical Impairments Affecting Rehab Potential Dialysis limits strength and endurance with slow gains.    PT Frequency 2x / week   PT Duration 8 weeks   PT Treatment/Interventions ADLs/Self Care Home Management;DME Instruction;Gait training;Functional mobility training;Therapeutic activities;Therapeutic exercise;Balance training;Neuromuscular re-education;Patient/family education;Prosthetic Training;Passive range of motion;Stair training   PT Next Visit Plan Assess STG's; continue with gait  training, balance and strengthening activities.   PT Home Exercise Plan Patient has joined Biomedical engineer.Plans first visit for Saturday 03/18/15.    Consulted and Agree with Plan of Care Patient   Family Member Consulted dtr, Cora        Problem List Patient Active Problem List   Diagnosis Date Noted  . Complications, amputation stump late ([place]) 05/20/2014  . Weakness generalized 04/07/2014  . Sepsis ([place]) 04/07/2014  . Decubitus ulcer, stage II 04/07/2014  . Decubitus ulcer of ankle   . Fatigue   . Wound infection ([place]) 04/06/2014  . Decubitus ulcer of sacral region, stage 2 03/07/2014  . Glaucoma 03/07/2014  . Gout 03/07/2014  . Below knee amputation status ([place]) 02/18/2014  . Osteomyelitis ([place]) 01/04/2014  . Phantom limb ([place]) 12/14/2013  . Type 1 diabetes mellitus with diabetic foot infection ([place])   . Diabetes mellitus with peripheral vascular disease ([place])   . Asthma 05/17/2013  . History of MRSA infection 04/22/2013  . Peripheral vascular disease ([place]) 11/19/2012  . End-stage renal disease on hemodialysis ([place]) 05/05/2012  . Kahler disease ([place]) 03/30/2012  . Anemia in chronic kidney disease  03/19/2012  . Hypothyroidism 03/19/2012  . Anemia, iron deficiency 03/19/2012  . Hypertension 03/18/2012  . Chronic kidney disease (CKD), stage V ([place]) 03/18/2012  . Cardiac conduction disorder 03/18/2012  . MGUS (monoclonal gammopathy of unknown significance) 02/28/2011  . Monoclonal paraproteinemia 02/28/2011    Bayard Beaver, SPTA 03/14/2015, 9:12 AM  [facility] [address] [place], [place], [address] Phone: [phone]   Fax:  [phone]  Name: CALBERT HULSEBUS MRN: [id number] Date of Birth: 08/16/1945  This note has been reviewed and edited by supervising CI.  Willow Ora, PTA, [place] [address] [place], [place] [address] [phone] 03/14/2015, 1:55 PM

## 2015-03-16 ENCOUNTER — Ambulatory Visit: Payer: Medicare Other | Attending: Orthopedic Surgery | Admitting: Physical Therapy

## 2015-03-16 ENCOUNTER — Encounter: Payer: Self-pay | Admitting: Physical Therapy

## 2015-03-16 DIAGNOSIS — R29818 Other symptoms and signs involving the nervous system: Secondary | ICD-10-CM | POA: Diagnosis present

## 2015-03-16 DIAGNOSIS — Z89512 Acquired absence of left leg below knee: Secondary | ICD-10-CM | POA: Diagnosis present

## 2015-03-16 DIAGNOSIS — Z4789 Encounter for other orthopedic aftercare: Secondary | ICD-10-CM

## 2015-03-16 DIAGNOSIS — R29898 Other symptoms and signs involving the musculoskeletal system: Secondary | ICD-10-CM | POA: Diagnosis present

## 2015-03-16 DIAGNOSIS — R2689 Other abnormalities of gait and mobility: Secondary | ICD-10-CM

## 2015-03-16 DIAGNOSIS — Z89511 Acquired absence of right leg below knee: Secondary | ICD-10-CM

## 2015-03-16 DIAGNOSIS — R2681 Unsteadiness on feet: Secondary | ICD-10-CM | POA: Diagnosis present

## 2015-03-16 DIAGNOSIS — M24659 Ankylosis, unspecified hip: Secondary | ICD-10-CM | POA: Diagnosis present

## 2015-03-16 DIAGNOSIS — R269 Unspecified abnormalities of gait and mobility: Secondary | ICD-10-CM | POA: Diagnosis present

## 2015-03-16 DIAGNOSIS — M24669 Ankylosis, unspecified knee: Secondary | ICD-10-CM | POA: Diagnosis present

## 2015-03-16 DIAGNOSIS — R6889 Other general symptoms and signs: Secondary | ICD-10-CM

## 2015-03-16 DIAGNOSIS — Z5189 Encounter for other specified aftercare: Secondary | ICD-10-CM | POA: Insufficient documentation

## 2015-03-16 NOTE — Therapy (Signed)
[facility] [address] [place], [place], [address] Phone: [phone]   Fax:  [phone]  Physical Therapy Treatment  Patient Details  Name: Jack [name] MRN: [id number] Date of Birth: 1945/05/31 Referring Provider: Meridee Score, MD  Encounter Date: 03/16/2015      PT End of Session - 03/16/15 1036    Visit Number 8   Number of Visits 17   Date for PT Re-Evaluation 04/13/15   Authorization Type G-code & progress note every 10 visits   PT Start Time 0800   PT Stop Time 0845   PT Time Calculation (min) 45 min   Equipment Utilized During Treatment Gait belt   Activity Tolerance Patient tolerated treatment well;Patient limited by fatigue   Behavior During Therapy [facility] for tasks assessed/performed      Past Medical History  Diagnosis Date  . Hypertension   . Pneumonia     2012  . Heart murmur   . Glaucoma   . Multiple myeloma, without mention of having achieved remission 03/30/2012    Cytogenetic neg on 03/23/2012.  . Asthma   . Hyperparathyroidism, secondary renal ([place])   . Peripheral arterial disease ([place])   . End-stage renal disease on hemodialysis [facility])     Started HD March 2014.  Cause of ESRD was DM.  Gets HD at [facility] on [address] on MWF schedule.   . Thyroid disease     hyperparathyroidism  . MRSA bacteremia   . Anemia   . Peripheral vascular disease, unspecified ([place]) 11/19/2012    In the past had R foot toe amps then R TMA. In 2015 had left foot toe amp > then TMA >then L BKA on 05/14/13   . History of MRSA infection 04/22/2013    Bacteremia assoc w L foot wound infection Mar 2015   . Gangrene of foot ([place])   . ESRD on hemodialysis ([place])   . Diabetes mellitus with peripheral vascular disease ([place])   . Hypothyroidism   . Decubitus ulcer of sacral region, stage 2 03/07/2014  . Glaucoma 03/07/2014  . Gangrene ([place])     right BKA  . Contracture of joint     left knee    Past Surgical History   Procedure Laterality Date  . Thyroidectomy    . Cervical disc surgery    . Insertion of dialysis catheter Right 03/19/2012    Procedure: INSERTION OF DIALYSIS CATHETER;  Surgeon: Mal Misty, MD;  Location: [place];  Service: Vascular;  Laterality: Right;  Right Internal Jugular  . Av fistula placement Left 03/25/2012    Procedure: ARTERIOVENOUS (AV) FISTULA CREATION;  Surgeon: Mal Misty, MD;  Location: [place];  Service: Vascular;  Laterality: Left;  . Ligation of competing branches of arteriovenous fistula Left 05/08/2012    Procedure: LIGATION OF COMPETING BRANCHES OF ARTERIOVENOUS FISTULA;  Surgeon: Mal Misty, MD;  Location: [place];  Service: Vascular;  Laterality: Left;  Ultrasound guided  . Cardiac catheterization      approx 30 years ago  . Amputation Right 11/10/2012    Procedure: AMPUTATION FIRST and SECOND TOES Right Foot;  Surgeon: Elam Dutch, MD;  Location: [facility] OR;  Service: Vascular;  Laterality: Right;  . Transmetatarsal amputation Left 12/16/2012    Procedure: TRANSMETATARSAL AMPUTATION AND VAC PLACEMENT;  Surgeon: Elam Dutch, MD;  Location: [place];  Service: Vascular;  Laterality: Left;  . Amputation Left 04/07/2013    Procedure: AMPUTATION DIGIT- LEFT 1ST TOE;  Surgeon: Mal Misty, MD;  Location: [place];  Service: Vascular;  Laterality: Left;  . Tee without cardioversion N/A 04/20/2013    Procedure: TRANSESOPHAGEAL ECHOCARDIOGRAM (TEE);  Surgeon: Josue Hector, MD;  Location: [facility] ENDOSCOPY;  Service: Cardiovascular;  Laterality: N/A;  . I&d extremity Left 04/22/2013    Procedure: [id number] EYC DEBRIDEMENT LEFT FIRST TOE AMPUTATION WOUND ;  Surgeon: Mal Misty, MD;  Location: [place];  Service: Vascular;  Laterality: Left;  . Amputation Left 04/26/2013    Procedure: Left Foot Transmetatarsal Amputation;  Surgeon: Newt Minion, MD;  Location: [place];  Service: Orthopedics;  Laterality: Left;  . Toe amputation      D/C 04-30-13  . Below knee leg amputation Left  05/14/2013    DR [name]  . Amputation Left 05/14/2013    Procedure: AMPUTATION BELOW KNEE;  Surgeon: Newt Minion, MD;  Location: [place];  Service: Orthopedics;  Laterality: Left;  Left Below Knee Amputation  . Eye surgery Bilateral     CATARACTS  . Amputation Left 07/23/2013    Procedure: AMPUTATION BELOW KNEE;  Surgeon: Newt Minion, MD;  Location: [place];  Service: Orthopedics;  Laterality: Left;  Left Below Knee Amputation Revision  . Abdominal aortagram Bilateral 11/06/2012    Procedure: ABDOMINAL AORTAGRAM;  Surgeon: Elam Dutch, MD;  Location: [facility] CATH LAB;  Service: Cardiovascular;  Laterality: Bilateral;  . I&d extremity Right 01/05/2014    Procedure: IRRIGATION AND DEBRIDEMENT Right Heel Ulcer;  Surgeon: Mcarthur Rossetti, MD;  Location: [place];  Service: Orthopedics;  Laterality: Right;  Surgeon Available after 5PM  . Below knee leg amputation Right 02/18/2014    dr duda  . Amputation Right 02/18/2014    Procedure: AMPUTATION BELOW KNEE;  Surgeon: Newt Minion, MD;  Location: [place];  Service: Orthopedics;  Laterality: Right;  . Stump revision Right 05/20/2014    Procedure: Revision Right Below Knee Amputation;  Surgeon: Newt Minion, MD;  Location: [place];  Service: Orthopedics;  Laterality: Right;  . Repair quadriceps/hamstring muscles Left 05/20/2014    Procedure: Left Hamstring Release;  Surgeon: Newt Minion, MD;  Location: [place];  Service: Orthopedics;  Laterality: Left;    There were no vitals filed for this visit.  Visit Diagnosis:  Abnormality of gait  Unsteadiness  Balance problems  Activity intolerance  Weakness of both legs  Decreased functional activity tolerance  Encounter for prosthetic gait training  Status post bilateral below knee amputation ([place])      Subjective Assessment - 03/16/15 0803    Subjective (p) He plans to go to [facility] this weekend.    Patient is accompained by: (p) Family member   Pertinent History (p) ESRD, Bil. Transtibial  Amputations, HTN, Type 1 DM, asthma, multiple myelomas, hyperparathyroidism, glaucoma, gout, PVD   Patient Stated Goals (p) He wants to walk more and get out of w/c.    Currently in Pain? (p) No/denies                         [facility] Adult PT Treatment/Exercise - 03/16/15 0800    Ambulation/Gait   Ambulation/Gait Yes   Ambulation/Gait Assistance 5: Supervision   Ambulation/Gait Assistance Details verbal cues on posture & step length   Ambulation Distance (Feet) 125 Feet  125' & 60'   Assistive device Rolling walker;Prostheses   Gait Pattern Step-through pattern;Decreased stride length;Decreased step length - right;Decreased step length - left;Right flexed knee in  stance;Left flexed knee in stance;Trunk flexed   Ambulation Surface Indoor;Level   Ramp 4: Min assist  RW / prostheses   Ramp Details (indicate cue type and reason) cues on technique including posture & weight shift   Curb 4: Min assist  RW & prostheses   Curb Details (indicate cue type and reason) demo, verbal cues on technique including foot position, and weight shift   Balance   Balance Assessed Yes   Dynamic Standing Balance   Reaching for objects comments: Standing with RW support: reaches 4" anteriorly, 2" laterally to right, 3" across mid-line and to ankle level towards floor with contact assist   Exercises   Exercises Other Exercises   Other Exercises  Sitting at edge of w/c with feet on floor, no back or arm support: forward lean to floor /upright, "Sit-Up" lean back slowly into chairback & sit-up, sidebend and trunk rotation.                 PT Education - 03/16/15 1033    Education provided Yes   Education Details sitting without back or UE support: trunk flexion, trunk extension (sit-up), sidebend, rotation. Starting exercise program [employer] with low intensity initially and building up.    Person(s) Educated Patient;Child(ren)   Methods Explanation;Demonstration;Tactile cues;Verbal cues    Comprehension Verbalized understanding;Returned demonstration;Verbal cues required;Tactile cues required;Need further instruction          PT Short Term Goals - 03/16/15 1037    PT SHORT TERM GOAL #1   Title Patient verbalizes understanding of updated HEP to progress activity tolerance. (Target Date: 03/16/2015)   Baseline MET 03/16/2015    Time 4   Period Weeks   Status Achieved   PT SHORT TERM GOAL #2   Title Patient ambulates 100' with RW & prostheses with supervision.  (Target Date: 03/16/2015)   Baseline MET 03/16/2015 Patient ambulates 125' with RW with SBA   Time 4   Period Weeks   Status Achieved   PT SHORT TERM GOAL #3   Title Patient negotiates ramps & curbs with rolling walker & prostheses with moderate assist.  (Target Date: 03/16/2015)   Baseline MET 03/16/2015   Time 4   Period Weeks   Status Achieved   PT SHORT TERM GOAL #4   Title Patient reaches 4" beyond length of his arm anteriorly and to ankle level towards ground with RW support with supervision.  (Target Date: 03/16/2015)   Baseline Partially MET Patient requires contact assist for safety to reach 4" anteriorly and to ankle level.    Time 4   Period Weeks   Status Achieved           PT Long Term Goals - 03/16/15 1039    PT LONG TERM GOAL #1   Title Patient verbalizes understanding of ongoing HEP including [employer] to progress strength, flexibility, balance & endurance.  (Target Date: 04/14/2015)   Time 8   Period Weeks   Status On-going   PT LONG TERM GOAL #2   Title TImed Up & Go with rolling walker <45 seconds modified independent to indicate lower fall risk.  (Target Date: 04/14/2015)   Time 8   Period Weeks   Status On-going   PT LONG TERM GOAL #3   Title Patient ambulates 125' with supervision with RW & prostheses including negotiating around obstacles for limited community mobility.  (Target Date: 04/14/2015)   Time 8   Period Weeks   Status On-going   PT LONG TERM GOAL #4  Title Patient negotiates ramps  & curbs with RW and stairs with 2 rails with minimal assist from family to enable community access.  (Target Date: 04/14/2015)   Time 8   Period Weeks   Status On-going   PT LONG TERM GOAL #5   Title Patient reaches 5" all directions and to floor with rolling walker support safely modified independent.  (Target Date: 04/14/2015)   Time 8   Period Weeks   Status On-going               Plan - 03/16/15 1040    Clinical Impression Statement Patient met or partially met 4 STGs established at evaluation. Patient is challenged by standing reaching activities but understands how to build trunk righting reactions / balance sitting without back or arm support. Patient reports increasing activity level outside of PT as advised.    Pt will benefit from skilled therapeutic intervention in order to improve on the following deficits Abnormal gait;Decreased activity tolerance;Decreased balance;Decreased endurance;Decreased knowledge of use of DME;Decreased mobility;Decreased range of motion;Decreased strength;Prosthetic Dependency   Rehab Potential Good   Clinical Impairments Affecting Rehab Potential Dialysis limits strength and endurance with slow gains.    PT Frequency 2x / week   PT Duration 8 weeks   PT Treatment/Interventions ADLs/Self Care Home Management;DME Instruction;Gait training;Functional mobility training;Therapeutic activities;Therapeutic exercise;Balance training;Neuromuscular re-education;Patient/family education;Prosthetic Training;Passive range of motion;Stair training   PT Next Visit Plan continue with gaiit  training, balance and strengthening activities - core strength in considerable need of work.   PT Home Exercise Plan Patient has joined [employer].Plans first visit for Saturday 03/18/15.    Consulted and Agree with Plan of Care Patient   Family Member Consulted dtr, Cora        Problem List Patient Active Problem List   Diagnosis Date Noted  . Complications, amputation  stump late ([place]) 05/20/2014  . Weakness generalized 04/07/2014  . Sepsis ([place]) 04/07/2014  . Decubitus ulcer, stage II 04/07/2014  . Decubitus ulcer of ankle   . Fatigue   . Wound infection ([place]) 04/06/2014  . Decubitus ulcer of sacral region, stage 2 03/07/2014  . Glaucoma 03/07/2014  . Gout 03/07/2014  . Below knee amputation status ([place]) 02/18/2014  . Osteomyelitis ([place]) 01/04/2014  . Phantom limb ([place]) 12/14/2013  . Type 1 diabetes mellitus with diabetic foot infection ([place])   . Diabetes mellitus with peripheral vascular disease ([place])   . Asthma 05/17/2013  . History of MRSA infection 04/22/2013  . Peripheral vascular disease ([place]) 11/19/2012  . End-stage renal disease on hemodialysis ([place]) 05/05/2012  . Kahler disease ([place]) 03/30/2012  . Anemia in chronic kidney disease 03/19/2012  . Hypothyroidism 03/19/2012  . Anemia, iron deficiency 03/19/2012  . Hypertension 03/18/2012  . Chronic kidney disease (CKD), stage V ([name]) 03/18/2012  . Cardiac conduction disorder 03/18/2012  . MGUS (monoclonal gammopathy of unknown significance) 02/28/2011  . Monoclonal paraproteinemia 02/28/2011    Jamey Reas PT, DPT 03/16/2015, 10:55 AM  [facility] [address] [place], [place], [address] Phone: [phone]   Fax:  [phone]  Name: Jack [name] MRN: [id number] Date of Birth: 27-Apr-1945

## 2015-03-21 ENCOUNTER — Ambulatory Visit: Payer: Medicare Other | Admitting: Physical Therapy

## 2015-03-21 ENCOUNTER — Encounter: Payer: Self-pay | Admitting: Physical Therapy

## 2015-03-21 DIAGNOSIS — R6889 Other general symptoms and signs: Secondary | ICD-10-CM

## 2015-03-21 DIAGNOSIS — R269 Unspecified abnormalities of gait and mobility: Secondary | ICD-10-CM | POA: Diagnosis not present

## 2015-03-21 DIAGNOSIS — R2689 Other abnormalities of gait and mobility: Secondary | ICD-10-CM

## 2015-03-21 DIAGNOSIS — R2681 Unsteadiness on feet: Secondary | ICD-10-CM

## 2015-03-21 NOTE — Therapy (Signed)
[facility] [address] [place], [place], [address] Phone: [phone]   Fax:  [phone]  Physical Therapy Treatment  Patient Details  Name: Jack [name] MRN: [id number] Date of Birth: 1945-02-11 Referring Provider: Meridee Score, MD  Encounter Date: 03/21/2015      PT End of Session - 03/21/15 1111    Visit Number 9   Number of Visits 17   Date for PT Re-Evaluation 04/13/15   Authorization Type G-code & progress note every 10 visits   PT Start Time 0802   PT Stop Time 0845   PT Time Calculation (min) 43 min   Equipment Utilized During Treatment Gait belt   Activity Tolerance Patient tolerated treatment well;Patient limited by fatigue   Behavior During Therapy [facility] for tasks assessed/performed      Past Medical History  Diagnosis Date  . Hypertension   . Pneumonia     2012  . Heart murmur   . Glaucoma   . Multiple myeloma, without mention of having achieved remission 03/30/2012    Cytogenetic neg on 03/23/2012.  . Asthma   . Hyperparathyroidism, secondary renal ([place])   . Peripheral arterial disease ([place])   . End-stage renal disease on hemodialysis [facility])     Started HD March 2014.  Cause of ESRD was DM.  Gets HD at [facility] on [address] on MWF schedule.   . Thyroid disease     hyperparathyroidism  . MRSA bacteremia   . Anemia   . Peripheral vascular disease, unspecified ([place]) 11/19/2012    In the past had R foot toe amps then R TMA. In 2015 had left foot toe amp > then TMA >then L BKA on 05/14/13   . History of MRSA infection 04/22/2013    Bacteremia assoc w L foot wound infection Mar 2015   . Gangrene of foot ([place])   . ESRD on hemodialysis ([place])   . Diabetes mellitus with peripheral vascular disease ([place])   . Hypothyroidism   . Decubitus ulcer of sacral region, stage 2 03/07/2014  . Glaucoma 03/07/2014  . Gangrene ([place])     right BKA  . Contracture of joint     left knee    Past Surgical History   Procedure Laterality Date  . Thyroidectomy    . Cervical disc surgery    . Insertion of dialysis catheter Right 03/19/2012    Procedure: INSERTION OF DIALYSIS CATHETER;  Surgeon: Mal Misty, MD;  Location: [place];  Service: Vascular;  Laterality: Right;  Right Internal Jugular  . Av fistula placement Left 03/25/2012    Procedure: ARTERIOVENOUS (AV) FISTULA CREATION;  Surgeon: Mal Misty, MD;  Location: [place];  Service: Vascular;  Laterality: Left;  . Ligation of competing branches of arteriovenous fistula Left 05/08/2012    Procedure: LIGATION OF COMPETING BRANCHES OF ARTERIOVENOUS FISTULA;  Surgeon: Mal Misty, MD;  Location: [place];  Service: Vascular;  Laterality: Left;  Ultrasound guided  . Cardiac catheterization      approx 30 years ago  . Amputation Right 11/10/2012    Procedure: AMPUTATION FIRST and SECOND TOES Right Foot;  Surgeon: Elam Dutch, MD;  Location: [facility] OR;  Service: Vascular;  Laterality: Right;  . Transmetatarsal amputation Left 12/16/2012    Procedure: TRANSMETATARSAL AMPUTATION AND VAC PLACEMENT;  Surgeon: Elam Dutch, MD;  Location: [place];  Service: Vascular;  Laterality: Left;  . Amputation Left 04/07/2013    Procedure: AMPUTATION DIGIT- LEFT 1ST TOE;  Surgeon: Mal Misty, MD;  Location: [place];  Service: Vascular;  Laterality: Left;  . Tee without cardioversion N/A 04/20/2013    Procedure: TRANSESOPHAGEAL ECHOCARDIOGRAM (TEE);  Surgeon: Josue Hector, MD;  Location: [facility] ENDOSCOPY;  Service: Cardiovascular;  Laterality: N/A;  . I&d extremity Left 04/22/2013    Procedure: EXNTZGYFVC BSW DEBRIDEMENT LEFT FIRST TOE AMPUTATION WOUND ;  Surgeon: Mal Misty, MD;  Location: [place];  Service: Vascular;  Laterality: Left;  . Amputation Left 04/26/2013    Procedure: Left Foot Transmetatarsal Amputation;  Surgeon: Newt Minion, MD;  Location: [place];  Service: Orthopedics;  Laterality: Left;  . Toe amputation      D/C 04-30-13  . Below knee leg amputation Left  05/14/2013    DR [name]  . Amputation Left 05/14/2013    Procedure: AMPUTATION BELOW KNEE;  Surgeon: Newt Minion, MD;  Location: [place];  Service: Orthopedics;  Laterality: Left;  Left Below Knee Amputation  . Eye surgery Bilateral     CATARACTS  . Amputation Left 07/23/2013    Procedure: AMPUTATION BELOW KNEE;  Surgeon: Newt Minion, MD;  Location: [place];  Service: Orthopedics;  Laterality: Left;  Left Below Knee Amputation Revision  . Abdominal aortagram Bilateral 11/06/2012    Procedure: ABDOMINAL AORTAGRAM;  Surgeon: Elam Dutch, MD;  Location: [facility] CATH LAB;  Service: Cardiovascular;  Laterality: Bilateral;  . I&d extremity Right 01/05/2014    Procedure: IRRIGATION AND DEBRIDEMENT Right Heel Ulcer;  Surgeon: Mcarthur Rossetti, MD;  Location: [place];  Service: Orthopedics;  Laterality: Right;  Surgeon Available after 5PM  . Below knee leg amputation Right 02/18/2014    dr duda  . Amputation Right 02/18/2014    Procedure: AMPUTATION BELOW KNEE;  Surgeon: Newt Minion, MD;  Location: [place];  Service: Orthopedics;  Laterality: Right;  . Stump revision Right 05/20/2014    Procedure: Revision Right Below Knee Amputation;  Surgeon: Newt Minion, MD;  Location: [place];  Service: Orthopedics;  Laterality: Right;  . Repair quadriceps/hamstring muscles Left 05/20/2014    Procedure: Left Hamstring Release;  Surgeon: Newt Minion, MD;  Location: [place];  Service: Orthopedics;  Laterality: Left;    There were no vitals filed for this visit.  Visit Diagnosis:  Abnormality of gait  Unsteadiness  Balance problems  Activity intolerance      Subjective Assessment - 03/21/15 0805    Subjective Patient went to [facility] this weekend and got a personal trainer with [employer]. Patient stated he enjoyed the experience. No new falls. Patient's daughter stated he has been navigating outdoor surfaces with RW and supervision of family. Patient states he is sore today due to workout with personal trainer  on Saturday.   Patient is accompained by: Family member   Pertinent History ESRD, Bil. Transtibial Amputations, HTN, Type 1 DM, asthma, multiple myelomas, hyperparathyroidism, glaucoma, gout, PVD   Patient Stated Goals He wants to walk more and get out of w/c.    Currently in Pain? No/denies          [facility] Adult PT Treatment/Exercise - 03/21/15 0817    Ambulation/Gait   Ambulation/Gait Yes   Ambulation/Gait Assistance 4: Min guard;4: Min assist   Ambulation/Gait Assistance Details Verbal cues to correct flexed posture and increase stride length. Patient stated his chest and knees were sore secondary to workout with personal trainer this weekend.    Ambulation Distance (Feet) 60 Feet  x2.   Assistive device Rolling  walker;Prostheses   Gait Pattern Step-through pattern;Decreased stride length;Decreased step length - right;Decreased step length - left;Right flexed knee in stance;Left flexed knee in stance;Trunk flexed   Ambulation Surface Level;Indoor   Neuro Re-ed    Neuro Re-ed Details  // Bars seated on stool light ball toss x 20 reps; tossing weighted green ball x 20 reps with supervision  increase balance and strength necessary for safe mobility.    Exercises   Exercises Other Exercises   Other Exercises  Sitting on stool in // bars: forward/reverse lean, lateral lean L/R, core rotation all while holding weighted green ball x 10 reps each; passing green ball around behind back to clinician to the right and to the left x 10 reps each to improve core strength necessary for safe ambulation. Seated rows on stool with red theraband high and low x 10 reps each.   Prosthetics   Prosthetic Care Comments  pt verbalizes how to appropriately weigh & adjust weigh-in /weigh-out for dialysis if he wants to wear prosthesis. He reports he does not trust the techs at dialysis and they already have problems with his BP.   Current prosthetic wear tolerance (days/week)  7 days /wk   Current prosthetic wear  tolerance (#hours/day)  all awake hours except during dialysis or bathing   Current prosthetic weight-bearing tolerance (hours/day)  5 minutes of balance /gait before LEs fatigued but no pain noted.    Edema none   Residual limb condition  intact per pt report   Education Provided Residual limb care;Correct ply sock adjustment   Person(s) Educated Patient;Child(ren)   Education Method Explanation   Education Method Verbalized understanding;Returned demonstration   Donning Prosthesis Modified independent (device/increased time)   Doffing Prosthesis Modified independent (device/increased time)          PT Short Term Goals - 03/16/15 1037    PT SHORT TERM GOAL #1   Title Patient verbalizes understanding of updated HEP to progress activity tolerance. (Target Date: 03/16/2015)   Baseline MET 03/16/2015    Time 4   Period Weeks   Status Achieved   PT SHORT TERM GOAL #2   Title Patient ambulates 100' with RW & prostheses with supervision.  (Target Date: 03/16/2015)   Baseline MET 03/16/2015 Patient ambulates 125' with RW with SBA   Time 4   Period Weeks   Status Achieved   PT SHORT TERM GOAL #3   Title Patient negotiates ramps & curbs with rolling walker & prostheses with moderate assist.  (Target Date: 03/16/2015)   Baseline MET 03/16/2015   Time 4   Period Weeks   Status Achieved   PT SHORT TERM GOAL #4   Title Patient reaches 4" beyond length of his arm anteriorly and to ankle level towards ground with RW support with supervision.  (Target Date: 03/16/2015)   Baseline Partially MET Patient requires contact assist for safety to reach 4" anteriorly and to ankle level.    Time 4   Period Weeks   Status Achieved          PT Long Term Goals - 03/16/15 1039    PT LONG TERM GOAL #1   Title Patient verbalizes understanding of ongoing HEP including [employer] to progress strength, flexibility, balance & endurance.  (Target Date: 04/14/2015)   Time 8   Period Weeks   Status On-going   PT LONG TERM  GOAL #2   Title TImed Up & Go with rolling walker <45 seconds modified independent to indicate lower fall risk.  (  Target Date: 04/14/2015)   Time 8   Period Weeks   Status On-going   PT LONG TERM GOAL #3   Title Patient ambulates 125' with supervision with RW & prostheses including negotiating around obstacles for limited community mobility.  (Target Date: 04/14/2015)   Time 8   Period Weeks   Status On-going   PT LONG TERM GOAL #4   Title Patient negotiates ramps & curbs with RW and stairs with 2 rails with minimal assist from family to enable community access.  (Target Date: 04/14/2015)   Time 8   Period Weeks   Status On-going   PT LONG TERM GOAL #5   Title Patient reaches 5" all directions and to floor with rolling walker support safely modified independent.  (Target Date: 04/14/2015)   Time 8   Period Weeks   Status On-going          Plan - 03/21/15 1111    Clinical Impression Statement Skilled session addressing deficits in gait, balance, and core strength. While patient stated he was sore today, he persevered with exercises and challenged his strength and balance. Patient is making steady progress toward goals.   Pt will benefit from skilled therapeutic intervention in order to improve on the following deficits Abnormal gait;Decreased activity tolerance;Decreased balance;Decreased endurance;Decreased knowledge of use of DME;Decreased mobility;Decreased range of motion;Decreased strength;Prosthetic Dependency   Rehab Potential Good   Clinical Impairments Affecting Rehab Potential Dialysis limits strength and endurance with slow gains.    PT Frequency 2x / week   PT Duration 8 weeks   PT Treatment/Interventions ADLs/Self Care Home Management;DME Instruction;Gait training;Functional mobility training;Therapeutic activities;Therapeutic exercise;Balance training;Neuromuscular re-education;Patient/family education;Prosthetic Training;Passive range of motion;Stair training   PT Next  Visit Plan G-CODE next visit. Continue with gaiit  training, balance and strengthening activities.   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 Follow up on progress with patient regarding personal trainer.   Consulted and Agree with Plan of Care Patient   Family Member Consulted dtr, Cora      Problem List Patient Active Problem List   Diagnosis Date Noted  . Complications, amputation stump late ([place]) 05/20/2014  . Weakness generalized 04/07/2014  . Sepsis ([place]) 04/07/2014  . Decubitus ulcer, stage II 04/07/2014  . Decubitus ulcer of ankle   . Fatigue   . Wound infection ([place]) 04/06/2014  . Decubitus ulcer of sacral region, stage 2 03/07/2014  . Glaucoma 03/07/2014  . Gout 03/07/2014  . Below knee amputation status ([place]) 02/18/2014  . Osteomyelitis ([place]) 01/04/2014  . Phantom limb ([place]) 12/14/2013  . Type 1 diabetes mellitus with diabetic foot infection ([place])   . Diabetes mellitus with peripheral vascular disease ([place])   . Asthma 05/17/2013  . History of MRSA infection 04/22/2013  . Peripheral vascular disease ([place]) 11/19/2012  . End-stage renal disease on hemodialysis ([place]) 05/05/2012  . Kahler disease ([place]) 03/30/2012  . Anemia in chronic kidney disease 03/19/2012  . Hypothyroidism 03/19/2012  . Anemia, iron deficiency 03/19/2012  . Hypertension 03/18/2012  . Chronic kidney disease (CKD), stage V ([place]) 03/18/2012  . Cardiac conduction disorder 03/18/2012  . MGUS (monoclonal gammopathy of unknown significance) 02/28/2011  . Monoclonal paraproteinemia 02/28/2011    Bayard Beaver, SPTA 03/21/2015, 11:16 AM  [facility] [address] [place], [place], [address] Phone: [phone]   Fax:  [phone]  Name: ANDREAS SOBOLEWSKI MRN: [id number] Date of Birth: [birth year]  This note has been reviewed  and edited by supervising CI.  Willow Ora, PTA, [facility] [address] [place], [place] [address] [phone] 03/22/2015, 1:02 PM

## 2015-03-23 ENCOUNTER — Ambulatory Visit: Payer: Medicare Other | Admitting: Physical Therapy

## 2015-03-23 ENCOUNTER — Encounter: Payer: Self-pay | Admitting: Physical Therapy

## 2015-03-23 DIAGNOSIS — R269 Unspecified abnormalities of gait and mobility: Secondary | ICD-10-CM | POA: Diagnosis not present

## 2015-03-23 DIAGNOSIS — Z4789 Encounter for other orthopedic aftercare: Secondary | ICD-10-CM

## 2015-03-23 DIAGNOSIS — Z89512 Acquired absence of left leg below knee: Secondary | ICD-10-CM

## 2015-03-23 DIAGNOSIS — R29898 Other symptoms and signs involving the musculoskeletal system: Secondary | ICD-10-CM

## 2015-03-23 DIAGNOSIS — R2689 Other abnormalities of gait and mobility: Secondary | ICD-10-CM

## 2015-03-23 DIAGNOSIS — Z89511 Acquired absence of right leg below knee: Secondary | ICD-10-CM

## 2015-03-23 DIAGNOSIS — R6889 Other general symptoms and signs: Secondary | ICD-10-CM

## 2015-03-23 DIAGNOSIS — R2681 Unsteadiness on feet: Secondary | ICD-10-CM

## 2015-03-23 DIAGNOSIS — M25669 Stiffness of unspecified knee, not elsewhere classified: Secondary | ICD-10-CM

## 2015-03-23 NOTE — Therapy (Signed)
[facility] [address] [place], [place], [address] Phone: [phone]   Fax:  [phone]  Physical Therapy Treatment  Patient Details  Name: Jack [name] MRN: [id number] Date of Birth: 08-Sep-1945 Referring Provider: Meridee Score, MD  Encounter Date: 03/23/2015      PT End of Session - 03/23/15 0906    Visit Number 10   Number of Visits 17   Date for PT Re-Evaluation 04/13/15   Authorization Type G-code & progress note every 10 visits   PT Start Time 0800   PT Stop Time 0847   PT Time Calculation (min) 47 min   Equipment Utilized During Treatment Gait belt   Activity Tolerance Patient tolerated treatment well;Patient limited by fatigue   Behavior During Therapy [facility] for tasks assessed/performed      Past Medical History  Diagnosis Date  . Hypertension   . Pneumonia     2012  . Heart murmur   . Glaucoma   . Multiple myeloma, without mention of having achieved remission 03/30/2012    Cytogenetic neg on 03/23/2012.  . Asthma   . Hyperparathyroidism, secondary renal ([place])   . Peripheral arterial disease ([place])   . End-stage renal disease on hemodialysis [facility])     Started HD March 2014.  Cause of ESRD was DM.  Gets HD at [facility] on [address] on MWF schedule.   . Thyroid disease     hyperparathyroidism  . MRSA bacteremia   . Anemia   . Peripheral vascular disease, unspecified ([place]) 11/19/2012    In the past had R foot toe amps then R TMA. In 2015 had left foot toe amp > then TMA >then L BKA on 05/14/13   . History of MRSA infection 04/22/2013    Bacteremia assoc w L foot wound infection Mar 2015   . Gangrene of foot ([place])   . ESRD on hemodialysis ([place])   . Diabetes mellitus with peripheral vascular disease ([place])   . Hypothyroidism   . Decubitus ulcer of sacral region, stage 2 03/07/2014  . Glaucoma 03/07/2014  . Gangrene ([place])     right BKA  . Contracture of joint     left knee    Past Surgical History   Procedure Laterality Date  . Thyroidectomy    . Cervical disc surgery    . Insertion of dialysis catheter Right 03/19/2012    Procedure: INSERTION OF DIALYSIS CATHETER;  Surgeon: Mal Misty, MD;  Location: [place];  Service: Vascular;  Laterality: Right;  Right Internal Jugular  . Av fistula placement Left 03/25/2012    Procedure: ARTERIOVENOUS (AV) FISTULA CREATION;  Surgeon: Mal Misty, MD;  Location: [place];  Service: Vascular;  Laterality: Left;  . Ligation of competing branches of arteriovenous fistula Left 05/08/2012    Procedure: LIGATION OF COMPETING BRANCHES OF ARTERIOVENOUS FISTULA;  Surgeon: Mal Misty, MD;  Location: [place];  Service: Vascular;  Laterality: Left;  Ultrasound guided  . Cardiac catheterization      approx 30 years ago  . Amputation Right 11/10/2012    Procedure: AMPUTATION FIRST and SECOND TOES Right Foot;  Surgeon: Elam Dutch, MD;  Location: [facility] OR;  Service: Vascular;  Laterality: Right;  . Transmetatarsal amputation Left 12/16/2012    Procedure: TRANSMETATARSAL AMPUTATION AND VAC PLACEMENT;  Surgeon: Elam Dutch, MD;  Location: [place];  Service: Vascular;  Laterality: Left;  . Amputation Left 04/07/2013    Procedure: AMPUTATION DIGIT- LEFT 1ST TOE;  Surgeon: Mal Misty, MD;  Location: [place];  Service: Vascular;  Laterality: Left;  . Tee without cardioversion N/A 04/20/2013    Procedure: TRANSESOPHAGEAL ECHOCARDIOGRAM (TEE);  Surgeon: Josue Hector, MD;  Location: [facility] ENDOSCOPY;  Service: Cardiovascular;  Laterality: N/A;  . I&d extremity Left 04/22/2013    Procedure: ZOXWRUEAVW UJW DEBRIDEMENT LEFT FIRST TOE AMPUTATION WOUND ;  Surgeon: Mal Misty, MD;  Location: [place];  Service: Vascular;  Laterality: Left;  . Amputation Left 04/26/2013    Procedure: Left Foot Transmetatarsal Amputation;  Surgeon: Newt Minion, MD;  Location: [place];  Service: Orthopedics;  Laterality: Left;  . Toe amputation      D/C 04-30-13  . Below knee leg amputation Left  05/14/2013    DR [name]  . Amputation Left 05/14/2013    Procedure: AMPUTATION BELOW KNEE;  Surgeon: Newt Minion, MD;  Location: [place];  Service: Orthopedics;  Laterality: Left;  Left Below Knee Amputation  . Eye surgery Bilateral     CATARACTS  . Amputation Left 07/23/2013    Procedure: AMPUTATION BELOW KNEE;  Surgeon: Newt Minion, MD;  Location: [place];  Service: Orthopedics;  Laterality: Left;  Left Below Knee Amputation Revision  . Abdominal aortagram Bilateral 11/06/2012    Procedure: ABDOMINAL AORTAGRAM;  Surgeon: Elam Dutch, MD;  Location: [facility] CATH LAB;  Service: Cardiovascular;  Laterality: Bilateral;  . I&d extremity Right 01/05/2014    Procedure: IRRIGATION AND DEBRIDEMENT Right Heel Ulcer;  Surgeon: Mcarthur Rossetti, MD;  Location: [place];  Service: Orthopedics;  Laterality: Right;  Surgeon Available after 5PM  . Below knee leg amputation Right 02/18/2014    dr duda  . Amputation Right 02/18/2014    Procedure: AMPUTATION BELOW KNEE;  Surgeon: Newt Minion, MD;  Location: [place];  Service: Orthopedics;  Laterality: Right;  . Stump revision Right 05/20/2014    Procedure: Revision Right Below Knee Amputation;  Surgeon: Newt Minion, MD;  Location: [place];  Service: Orthopedics;  Laterality: Right;  . Repair quadriceps/hamstring muscles Left 05/20/2014    Procedure: Left Hamstring Release;  Surgeon: Newt Minion, MD;  Location: [place];  Service: Orthopedics;  Laterality: Left;    There were no vitals filed for this visit.  Visit Diagnosis:  Abnormality of gait  Unsteadiness  Balance problems  Activity intolerance  Weakness of both legs  Decreased functional activity tolerance  Encounter for prosthetic gait training  Status post bilateral below knee amputation ([place])  Decreased ROM of lower extremity      Subjective Assessment - 03/23/15 0804    Subjective He is planning to go to [facility] both Saturday & Sunday. He is walking with RW more.    Patient is accompained by:  Family member   Pertinent History ESRD, Bil. Transtibial Amputations, HTN, Type 1 DM, asthma, multiple myelomas, hyperparathyroidism, glaucoma, gout, PVD   Patient Stated Goals He wants to walk more and get out of w/c.    Currently in Pain? No/denies                         [facility] Adult PT Treatment/Exercise - 03/23/15 0800    Transfers   Transfers Sit to Stand;Stand to Sit   Sit to Stand 5: Supervision;With upper extremity assist;With armrests;From chair/3-in-1  to RW   Stand to Sit 5: Supervision;With upper extremity assist;With armrests;To chair/3-in-1  from RW   Ambulation/Gait   Ambulation/Gait Yes   Ambulation/Gait  Assistance 5: Supervision   Ambulation/Gait Assistance Details verbal cues on posture, sequencing   Ambulation Distance (Feet) 130 Feet  130' X 1, 25' X 2   Assistive device Rolling walker;Prostheses   Gait Pattern Step-through pattern;Decreased stride length;Decreased step length - right;Decreased step length - left;Right flexed knee in stance;Left flexed knee in stance;Trunk flexed   Ambulation Surface Indoor;Level   Balance   Balance Assessed Yes   Dynamic Standing Balance   Reaching for objects comments: standing with RW support: reaches forward, right, across midline, left 2" with supervision, reaches 4" forward & to floor with stabilization of RW & contact assist.    Standardized Balance Assessment   Standardized Balance Assessment Timed Up and Go Test   Timed Up and Go Test   TUG Normal TUG   Normal TUG (seconds) 47.99  rolling walker with supervision   Neuro Re-ed    Neuro Re-ed Details  See pt instructions for reaching & stretching at counter with RW support.      Stand at counter with rolling walker: Hold each stretch 3 or more deep breathes. Do as many as you can with control. The number should improve over time.  Back to counter: 1) put hands on counter & push up "tall" with legs, back & arms  2) hands on counter, lean back looking  up, trying to touch cabinet with head  3) right hand on counter, left hand on right side of walker - look over right shoulder behind you. Repeat to your left.  4) move forward so buttocks not touching counter, lean forward to touch floor then stand up straight.  Turn to face the cabinet with walker close but not touching the counter. Move cup.  1) move cup forward as far as possible, then move it back to edge of counter.  2) move cup to right & left using right arm so you reach across your middle.  3) place cup on upper cabinet on lower shelf               PT Education - 03/23/15 0904    Education provided Yes   Education Details standing back stretches & reaching exercises   Person(s) Educated Patient;Child(ren)   Methods Explanation;Demonstration;Tactile cues;Verbal cues;Handout   Comprehension Verbalized understanding;Returned demonstration;Verbal cues required;Tactile cues required;Need further instruction          PT Short Term Goals - 03/16/15 1037    PT SHORT TERM GOAL #1   Title Patient verbalizes understanding of updated HEP to progress activity tolerance. (Target Date: 03/16/2015)   Baseline MET 03/16/2015    Time 4   Period Weeks   Status Achieved   PT SHORT TERM GOAL #2   Title Patient ambulates 100' with RW & prostheses with supervision.  (Target Date: 03/16/2015)   Baseline MET 03/16/2015 Patient ambulates 125' with RW with SBA   Time 4   Period Weeks   Status Achieved   PT SHORT TERM GOAL #3   Title Patient negotiates ramps & curbs with rolling walker & prostheses with moderate assist.  (Target Date: 03/16/2015)   Baseline MET 03/16/2015   Time 4   Period Weeks   Status Achieved   PT SHORT TERM GOAL #4   Title Patient reaches 4" beyond length of his arm anteriorly and to ankle level towards ground with RW support with supervision.  (Target Date: 03/16/2015)   Baseline Partially MET Patient requires contact assist for safety to reach 4" anteriorly and to ankle level.  Time 4   Period Weeks   Status Achieved           PT Long Term Goals - 03/16/15 1039    PT LONG TERM GOAL #1   Title Patient verbalizes understanding of ongoing HEP including [employer] to progress strength, flexibility, balance & endurance.  (Target Date: 04/14/2015)   Time 8   Period Weeks   Status On-going   PT LONG TERM GOAL #2   Title TImed Up & Go with rolling walker <45 seconds modified independent to indicate lower fall risk.  (Target Date: 04/14/2015)   Time 8   Period Weeks   Status On-going   PT LONG TERM GOAL #3   Title Patient ambulates 125' with supervision with RW & prostheses including negotiating around obstacles for limited community mobility.  (Target Date: 04/14/2015)   Time 8   Period Weeks   Status On-going   PT LONG TERM GOAL #4   Title Patient negotiates ramps & curbs with RW and stairs with 2 rails with minimal assist from family to enable community access.  (Target Date: 04/14/2015)   Time 8   Period Weeks   Status On-going   PT LONG TERM GOAL #5   Title Patient reaches 5" all directions and to floor with rolling walker support safely modified independent.  (Target Date: 04/14/2015)   Time 8   Period Weeks   Status On-going               Plan - 2015-04-05 0907    Clinical Impression Statement Patient improved Timed Up-Go indicates improved mobility. Patient & daughter appear to understand standing back stretches & reaching activities for home. Patient appears to be increasing his activity level outside of PT.    Pt will benefit from skilled therapeutic intervention in order to improve on the following deficits Abnormal gait;Decreased activity tolerance;Decreased balance;Decreased endurance;Decreased knowledge of use of DME;Decreased mobility;Decreased range of motion;Decreased strength;Prosthetic Dependency   Rehab Potential Good   Clinical Impairments Affecting Rehab Potential Dialysis limits strength and endurance with slow gains.    PT Frequency  2x / week   PT Duration 8 weeks   PT Treatment/Interventions ADLs/Self Care Home Management;DME Instruction;Gait training;Functional mobility training;Therapeutic activities;Therapeutic exercise;Balance training;Neuromuscular re-education;Patient/family education;Prosthetic Training;Passive range of motion;Stair training   PT Next Visit Plan Continue with gaiit  training, balance and strengthening activities - core strength in considerable need of work.   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 Follow up on progress with patient regarding personal trainer.   Consulted and Agree with Plan of Care Patient   Family Member Consulted Babette Relic - 05-Apr-2015 [id number]    Functional Assessment Tool Used Timed Up & Go 47.99sec with rolling walker & prostheses with supervision   Functional Limitation Mobility: Walking and moving around   Mobility: Walking and Moving Around Current Status [id number]) At least 80 percent but less than 100 percent impaired, limited or restricted   Mobility: Walking and Moving Around Goal Status [id number]) At least 60 percent but less than 80 percent impaired, limited or restricted      Problem List Patient Active Problem List   Diagnosis Date Noted  . Complications, amputation stump late ([place]) 05/20/2014  . Weakness generalized 04/07/2014  . Sepsis ([place]) 04/07/2014  . Decubitus ulcer, stage II 04/07/2014  . Decubitus ulcer of ankle   . Fatigue   . Wound infection ([place]) 04/06/2014  .  Decubitus ulcer of sacral region, stage 2 03/07/2014  . Glaucoma 03/07/2014  . Gout 03/07/2014  . Below knee amputation status ([place]) 02/18/2014  . Osteomyelitis ([place]) 01/04/2014  . Phantom limb ([place]) 12/14/2013  . Type 1 diabetes mellitus with diabetic foot infection ([place])   . Diabetes mellitus with peripheral vascular disease ([place])   . Asthma 05/17/2013  . History of MRSA infection 04/22/2013  . Peripheral vascular disease ([place])  11/19/2012  . End-stage renal disease on hemodialysis ([place]) 05/05/2012  . Kahler disease ([place]) 03/30/2012  . Anemia in chronic kidney disease 03/19/2012  . Hypothyroidism 03/19/2012  . Anemia, iron deficiency 03/19/2012  . Hypertension 03/18/2012  . Chronic kidney disease (CKD), stage V ([place]) 03/18/2012  . Cardiac conduction disorder 03/18/2012  . MGUS (monoclonal gammopathy of unknown significance) 02/28/2011  . Monoclonal paraproteinemia 02/28/2011    Jamey Reas PT, DPT 03/23/2015, 9:15 AM  [facility] [address] [place], [place], [address] Phone: [phone]   Fax:  [phone]  Name: Jack [name] MRN: [id number] Date of Birth: 12/24/1945

## 2015-03-23 NOTE — Patient Instructions (Signed)
Stand at counter with rolling walker: Hold each stretch 3 or more deep breathes. Do as many as you can with control. The number should improve over time.  Back to counter: 1) put hands on counter & push up "tall" with legs, back & arms  2) hands on counter, lean back looking up, trying to touch cabinet with head  3) right hand on counter, left hand on right side of walker - look over right shoulder behind you.   Repeat to your left.  4) move forward so buttocks not touching counter, lean forward to touch floor then stand up straight.  Turn to face the cabinet with walker close but not touching the counter. Move cup. 1) move cup forward as far as possible, then move it back to edge of counter.  2) move cup to right & left using right arm so you reach across your middle.  3) place cup on upper cabinet on lower shelf

## 2015-03-28 ENCOUNTER — Ambulatory Visit: Payer: Medicare Other | Admitting: Physical Therapy

## 2015-03-28 DIAGNOSIS — R269 Unspecified abnormalities of gait and mobility: Secondary | ICD-10-CM

## 2015-03-28 DIAGNOSIS — R2681 Unsteadiness on feet: Secondary | ICD-10-CM

## 2015-03-28 DIAGNOSIS — R6889 Other general symptoms and signs: Secondary | ICD-10-CM

## 2015-03-28 DIAGNOSIS — Z4789 Encounter for other orthopedic aftercare: Secondary | ICD-10-CM

## 2015-03-28 DIAGNOSIS — M25659 Stiffness of unspecified hip, not elsewhere classified: Secondary | ICD-10-CM

## 2015-03-28 DIAGNOSIS — M24669 Ankylosis, unspecified knee: Secondary | ICD-10-CM

## 2015-03-28 DIAGNOSIS — R29898 Other symptoms and signs involving the musculoskeletal system: Secondary | ICD-10-CM

## 2015-03-28 DIAGNOSIS — R2689 Other abnormalities of gait and mobility: Secondary | ICD-10-CM

## 2015-03-28 NOTE — Patient Instructions (Signed)
Hip Flexor Stretch    Lying on back near edge of bed, bend one leg, foot flat. Hang other leg over edge, relaxed, thigh resting entirely on bed for _4-5___ minutes. Repeat __1-2__ times each side. Do __1-2__ sessions per day. Can rest foot on stool if does not touch floor for increased comfort. Advanced Exercise: Bend knee back keeping thigh in contact with bed.  http://gt2.exer.us/346   Copyright  VHI. All rights reserved.

## 2015-03-29 NOTE — Therapy (Signed)
[facility] [address] [place], [place], [address] Phone: [phone]   Fax:  [phone]  Physical Therapy Treatment  Patient Details  Name: Jack [name] MRN: [id number] Date of Birth: Apr 16, 1945 Referring Provider: Meridee Score, MD  Encounter Date: 03/28/2015      PT End of Session - 03/28/15 0808    Visit Number 11   Number of Visits 17   Date for PT Re-Evaluation 04/13/15   Authorization Type G-code & progress note every 10 visits   PT Start Time 0802   PT Stop Time 0845   PT Time Calculation (min) 43 min   Equipment Utilized During Treatment Gait belt   Activity Tolerance Patient tolerated treatment well;Patient limited by fatigue   Behavior During Therapy [place] for tasks assessed/performed      Past Medical History  Diagnosis Date  . Hypertension   . Pneumonia     2012  . Heart murmur   . Glaucoma   . Multiple myeloma, without mention of having achieved remission 03/30/2012    Cytogenetic neg on 03/23/2012.  . Asthma   . Hyperparathyroidism, secondary renal ([place])   . Peripheral arterial disease ([place])   . End-stage renal disease on hemodialysis [facility])     Started HD March 2014.  Cause of ESRD was DM.  Gets HD at [facility] on [address] on MWF schedule.   . Thyroid disease     hyperparathyroidism  . MRSA bacteremia   . Anemia   . Peripheral vascular disease, unspecified ([place]) 11/19/2012    In the past had R foot toe amps then R TMA. In 2015 had left foot toe amp > then TMA >then L BKA on 05/14/13   . History of MRSA infection 04/22/2013    Bacteremia assoc w L foot wound infection Mar 2015   . Gangrene of foot ([place])   . ESRD on hemodialysis ([place])   . Diabetes mellitus with peripheral vascular disease ([place])   . Hypothyroidism   . Decubitus ulcer of sacral region, stage 2 03/07/2014  . Glaucoma 03/07/2014  . Gangrene ([place])     right BKA  . Contracture of joint     left knee    Past Surgical  History  Procedure Laterality Date  . Thyroidectomy    . Cervical disc surgery    . Insertion of dialysis catheter Right 03/19/2012    Procedure: INSERTION OF DIALYSIS CATHETER;  Surgeon: Mal Misty, MD;  Location: [facility];  Service: Vascular;  Laterality: Right;  Right Internal Jugular  . Av fistula placement Left 03/25/2012    Procedure: ARTERIOVENOUS (AV) FISTULA CREATION;  Surgeon: Mal Misty, MD;  Location: [facility];  Service: Vascular;  Laterality: Left;  . Ligation of competing branches of arteriovenous fistula Left 05/08/2012    Procedure: LIGATION OF COMPETING BRANCHES OF ARTERIOVENOUS FISTULA;  Surgeon: Mal Misty, MD;  Location: [facility];  Service: Vascular;  Laterality: Left;  Ultrasound guided  . Cardiac catheterization      approx 30 years ago  . Amputation Right 11/10/2012    Procedure: AMPUTATION FIRST and SECOND TOES Right Foot;  Surgeon: Elam Dutch, MD;  Location: [facility] OR;  Service: Vascular;  Laterality: Right;  . Transmetatarsal amputation Left 12/16/2012    Procedure: TRANSMETATARSAL AMPUTATION AND VAC PLACEMENT;  Surgeon: Elam Dutch, MD;  Location: [facility];  Service: Vascular;  Laterality: Left;  . Amputation Left 04/07/2013    Procedure: AMPUTATION DIGIT- LEFT 1ST TOE;  Surgeon: Mal Misty, MD;  Location: [facility];  Service: Vascular;  Laterality: Left;  . Tee without cardioversion N/A 04/20/2013    Procedure: TRANSESOPHAGEAL ECHOCARDIOGRAM (TEE);  Surgeon: Josue Hector, MD;  Location: [facility] ENDOSCOPY;  Service: Cardiovascular;  Laterality: N/A;  . I&d extremity Left 04/22/2013    Procedure: [id number] DHD DEBRIDEMENT LEFT FIRST TOE AMPUTATION WOUND ;  Surgeon: Mal Misty, MD;  Location: [facility];  Service: Vascular;  Laterality: Left;  . Amputation Left 04/26/2013    Procedure: Left Foot Transmetatarsal Amputation;  Surgeon: Newt Minion, MD;  Location: [facility];  Service: Orthopedics;  Laterality: Left;  . Toe amputation      D/C 04-30-13  . Below knee leg amputation  Left 05/14/2013    DR [name]  . Amputation Left 05/14/2013    Procedure: AMPUTATION BELOW KNEE;  Surgeon: Newt Minion, MD;  Location: [facility];  Service: Orthopedics;  Laterality: Left;  Left Below Knee Amputation  . Eye surgery Bilateral     CATARACTS  . Amputation Left 07/23/2013    Procedure: AMPUTATION BELOW KNEE;  Surgeon: Newt Minion, MD;  Location: [facility];  Service: Orthopedics;  Laterality: Left;  Left Below Knee Amputation Revision  . Abdominal aortagram Bilateral 11/06/2012    Procedure: ABDOMINAL AORTAGRAM;  Surgeon: Elam Dutch, MD;  Location: [facility] CATH LAB;  Service: Cardiovascular;  Laterality: Bilateral;  . I&d extremity Right 01/05/2014    Procedure: IRRIGATION AND DEBRIDEMENT Right Heel Ulcer;  Surgeon: Mcarthur Rossetti, MD;  Location: [facility];  Service: Orthopedics;  Laterality: Right;  Surgeon Available after 5PM  . Below knee leg amputation Right 02/18/2014    dr duda  . Amputation Right 02/18/2014    Procedure: AMPUTATION BELOW KNEE;  Surgeon: Newt Minion, MD;  Location: [facility];  Service: Orthopedics;  Laterality: Right;  . Stump revision Right 05/20/2014    Procedure: Revision Right Below Knee Amputation;  Surgeon: Newt Minion, MD;  Location: [facility];  Service: Orthopedics;  Laterality: Right;  . Repair quadriceps/hamstring muscles Left 05/20/2014    Procedure: Left Hamstring Release;  Surgeon: Newt Minion, MD;  Location: [facility];  Service: Orthopedics;  Laterality: Left;    There were no vitals filed for this visit.  Visit Diagnosis:   Abnormality of gait    Unsteadiness   .  Balance problems     Activity intolerance   .  Weakness of both legs   .  Decreased functional activity tolerance   .  Encounter for prosthetic gait training   .  Decreased range of knee movement, unspecified laterality    Decreased range of hip movement, unspecified laterality         Subjective Assessment - 03/28/15 0806    Subjective No new complaints. No falls. Went to [facility]  on Saturday, missed Sunday due to anticipated snow.    Patient is accompained by: Family member  daughter   Pertinent History ESRD, Bil. Transtibial Amputations, HTN, Type 1 DM, asthma, multiple myelomas, hyperparathyroidism, glaucoma, gout, PVD   Patient Stated Goals He wants to walk more and get out of w/c.    Currently in Pain? No/denies   Pain Score 0-No pain           [facility] Adult PT Treatment/Exercise - 03/28/15 0809    Transfers   Transfers Sit to Stand;Stand to Sit   Sit to Stand 5: Supervision;With upper extremity assist;With armrests;From chair/3-in-1   Stand to Sit 5: Supervision;With  upper extremity assist;With armrests;To chair/3-in-1   Ambulation/Gait   Ambulation/Gait Yes   Ambulation/Gait Assistance 5: Supervision;4: Min guard  min guard for last 10-12 feet, including turn to sit   Ambulation/Gait Assistance Details cues for upright posture, equal step length, increased base of support, and for increased hip extension.                         Ambulation Distance (Feet) 120 Feet  x1   Assistive device Rolling walker;Prostheses   Gait Pattern Step-through pattern;Decreased stride length;Decreased step length - right;Decreased step length - left;Right flexed knee in stance;Left flexed knee in stance;Trunk flexed  bil hip flexion with standing   Ambulation Surface Level;Indoor   Knee/Hip Exercises: Stretches   Hip Flexor Stretch Both;Limitations   Hip Flexor Stretch Limitations off edge of bed, held 3-4 minutes each side.rihgt foot propped on 4 inch stool and left foot propped on 2 inch stool for increased comfort with stretch. added to HEP.   Knee/Hip Exercises: Supine   Bridges Strengthening;Both;AROM;2 sets;10 reps;Limitations   Other Supine Knee/Hip Exercises seated on green air disc: partial curl ups 2 sets of 10 reps, alternating elbow taps to yoga blocks x 10 reps each side, and  upper trunk rotation holding dowel rod up x 10 reps each way. cues on posture,  abdominal bracing and assist to stabilize legs needed (to keep feet on floor).   Prosthetics   Current prosthetic wear tolerance (days/week)  7 days /wk   Current prosthetic wear tolerance (#hours/day)  all awake hours except during dialysis or bathing   Residual limb condition  intact per pt report   Education Provided Residual limb care;Correct ply sock adjustment   Person(s) Educated Patient;Child(ren)   Education Method Explanation;Verbal cues   Education Method Verbalized understanding   Donning Prosthesis Modified independent (device/increased time)   Doffing Prosthesis Modified independent (device/increased time)            PT Short Term Goals - 03/16/15 1037    PT SHORT TERM GOAL #1   Title Patient verbalizes understanding of updated HEP to progress activity tolerance. (Target Date: 03/16/2015)   Baseline MET 03/16/2015    Time 4   Period Weeks   Status Achieved   PT SHORT TERM GOAL #2   Title Patient ambulates 100' with RW & prostheses with supervision.  (Target Date: 03/16/2015)   Baseline MET 03/16/2015 Patient ambulates 125' with RW with SBA   Time 4   Period Weeks   Status Achieved   PT SHORT TERM GOAL #3   Title Patient negotiates ramps & curbs with rolling walker & prostheses with moderate assist.  (Target Date: 03/16/2015)   Baseline MET 03/16/2015   Time 4   Period Weeks   Status Achieved   PT SHORT TERM GOAL #4   Title Patient reaches 4" beyond length of his arm anteriorly and to ankle level towards ground with RW support with supervision.  (Target Date: 03/16/2015)   Baseline Partially MET Patient requires contact assist for safety to reach 4" anteriorly and to ankle level.    Time 4   Period Weeks   Status Achieved           PT Long Term Goals - 03/16/15 1039    PT LONG TERM GOAL #1   Title Patient verbalizes understanding of ongoing HEP including [employer] to progress strength, flexibility, balance & endurance.  (Target Date: 04/14/2015)   Time 8   Period Weeks  Status On-going   PT LONG TERM GOAL #2   Title TImed Up & Go with rolling walker <45 seconds modified independent to indicate lower fall risk.  (Target Date: 04/14/2015)   Time 8   Period Weeks   Status On-going   PT LONG TERM GOAL #3   Title Patient ambulates 125' with supervision with RW & prostheses including negotiating around obstacles for limited community mobility.  (Target Date: 04/14/2015)   Time 8   Period Weeks   Status On-going   PT LONG TERM GOAL #4   Title Patient negotiates ramps & curbs with RW and stairs with 2 rails with minimal assist from family to enable community access.  (Target Date: 04/14/2015)   Time 8   Period Weeks   Status On-going   PT LONG TERM GOAL #5   Title Patient reaches 5" all directions and to floor with rolling walker support safely modified independent.  (Target Date: 04/14/2015)   Time 8   Period Weeks   Status On-going           Plan - 03/28/15 0808    Clinical Impression Statement Continued to focus on gait and strengthening today without any issues reported. Added hip flexor stretching to home due to pt with decreased hip extension in standing, creating forward flexed posture. Pt is making steady progress toward goals,   Pt will benefit from skilled therapeutic intervention in order to improve on the following deficits Abnormal gait;Decreased activity tolerance;Decreased balance;Decreased endurance;Decreased knowledge of use of DME;Decreased mobility;Decreased range of motion;Decreased strength;Prosthetic Dependency   Rehab Potential Good   Clinical Impairments Affecting Rehab Potential Dialysis limits strength and endurance with slow gains.    PT Frequency 2x / week   PT Duration 8 weeks   PT Treatment/Interventions ADLs/Self Care Home Management;DME Instruction;Gait training;Functional mobility training;Therapeutic activities;Therapeutic exercise;Balance training;Neuromuscular re-education;Patient/family education;Prosthetic  Training;Passive range of motion;Stair training   PT Next Visit Plan Continue with gaiit  training, balance and strengthening activities - core strength in considerable need of work.   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 Working with trainer 1-2 times a week. Stll doing his HEP at home as well.   Consulted and Agree with Plan of Care Patient   Family Member Consulted dtr, Cora        Problem List Patient Active Problem List   Diagnosis Date Noted  . Complications, amputation stump late ([place]) 05/20/2014  . Weakness generalized 04/07/2014  . Sepsis ([place]) 04/07/2014  . Decubitus ulcer, stage II 04/07/2014  . Decubitus ulcer of ankle   . Fatigue   . Wound infection ([place]) 04/06/2014  . Decubitus ulcer of sacral region, stage 2 03/07/2014  . Glaucoma 03/07/2014  . Gout 03/07/2014  . Below knee amputation status ([place]) 02/18/2014  . Osteomyelitis ([place]) 01/04/2014  . Phantom limb ([place]) 12/14/2013  . Type 1 diabetes mellitus with diabetic foot infection ([place])   . Diabetes mellitus with peripheral vascular disease ([place])   . Asthma 05/17/2013  . History of MRSA infection 04/22/2013  . Peripheral vascular disease ([place]) 11/19/2012  . End-stage renal disease on hemodialysis ([place]) 05/05/2012  . Kahler disease ([place]) 03/30/2012  . Anemia in chronic kidney disease 03/19/2012  . Hypothyroidism 03/19/2012  . Anemia, iron deficiency 03/19/2012  . Hypertension 03/18/2012  . Chronic kidney disease (CKD), stage V ([place]) 03/18/2012  . Cardiac conduction disorder 03/18/2012  . MGUS (monoclonal gammopathy of unknown significance) 02/28/2011  . Monoclonal paraproteinemia 02/28/2011  Willow Ora, PTA, [facility] [address] [place], [place] [address] [phone] 03/29/2015, 10:18 PM   Name: ANTONIO CRESWELL MRN: [id number] Date of Birth: 1945/03/15

## 2015-03-30 ENCOUNTER — Ambulatory Visit: Payer: Medicare Other | Admitting: Physical Therapy

## 2015-03-30 ENCOUNTER — Encounter: Payer: Self-pay | Admitting: Physical Therapy

## 2015-03-30 DIAGNOSIS — R6889 Other general symptoms and signs: Secondary | ICD-10-CM

## 2015-03-30 DIAGNOSIS — R29898 Other symptoms and signs involving the musculoskeletal system: Secondary | ICD-10-CM

## 2015-03-30 DIAGNOSIS — R269 Unspecified abnormalities of gait and mobility: Secondary | ICD-10-CM

## 2015-03-30 DIAGNOSIS — M24669 Ankylosis, unspecified knee: Secondary | ICD-10-CM

## 2015-03-30 DIAGNOSIS — R2689 Other abnormalities of gait and mobility: Secondary | ICD-10-CM

## 2015-03-30 DIAGNOSIS — Z4789 Encounter for other orthopedic aftercare: Secondary | ICD-10-CM

## 2015-03-30 DIAGNOSIS — R2681 Unsteadiness on feet: Secondary | ICD-10-CM

## 2015-03-30 NOTE — Therapy (Signed)
[facility] [address] [place], [place], [address] Phone: [phone]   Fax:  [phone]  Physical Therapy Treatment  Patient Details  Name: Jack [name] MRN: [id number] Date of Birth: 05-11-1945 Referring Provider: Meridee Score, MD  Encounter Date: 03/30/2015      PT End of Session - 03/30/15 0906    Visit Number 12   Number of Visits 17   Date for PT Re-Evaluation 04/13/15   Authorization Type G-code & progress note every 10 visits   PT Start Time 0801   PT Stop Time 0847   PT Time Calculation (min) 46 min   Equipment Utilized During Treatment Gait belt   Activity Tolerance Patient tolerated treatment well;Patient limited by fatigue   Behavior During Therapy [facility] for tasks assessed/performed      Past Medical History  Diagnosis Date  . Hypertension   . Pneumonia     2012  . Heart murmur   . Glaucoma   . Multiple myeloma, without mention of having achieved remission 03/30/2012    Cytogenetic neg on 03/23/2012.  . Asthma   . Hyperparathyroidism, secondary renal ([place])   . Peripheral arterial disease ([place])   . End-stage renal disease on hemodialysis [facility])     Started HD March 2014.  Cause of ESRD was DM.  Gets HD at [facility] on [address] on MWF schedule.   . Thyroid disease     hyperparathyroidism  . MRSA bacteremia   . Anemia   . Peripheral vascular disease, unspecified ([place]) 11/19/2012    In the past had R foot toe amps then R TMA. In 2015 had left foot toe amp > then TMA >then L BKA on 05/14/13   . History of MRSA infection 04/22/2013    Bacteremia assoc w L foot wound infection Mar 2015   . Gangrene of foot ([place])   . ESRD on hemodialysis ([place])   . Diabetes mellitus with peripheral vascular disease ([place])   . Hypothyroidism   . Decubitus ulcer of sacral region, stage 2 03/07/2014  . Glaucoma 03/07/2014  . Gangrene ([place])     right BKA  . Contracture of joint     left knee    Past Surgical  History  Procedure Laterality Date  . Thyroidectomy    . Cervical disc surgery    . Insertion of dialysis catheter Right 03/19/2012    Procedure: INSERTION OF DIALYSIS CATHETER;  Surgeon: Mal Misty, MD;  Location: [place];  Service: Vascular;  Laterality: Right;  Right Internal Jugular  . Av fistula placement Left 03/25/2012    Procedure: ARTERIOVENOUS (AV) FISTULA CREATION;  Surgeon: Mal Misty, MD;  Location: [place];  Service: Vascular;  Laterality: Left;  . Ligation of competing branches of arteriovenous fistula Left 05/08/2012    Procedure: LIGATION OF COMPETING BRANCHES OF ARTERIOVENOUS FISTULA;  Surgeon: Mal Misty, MD;  Location: [place];  Service: Vascular;  Laterality: Left;  Ultrasound guided  . Cardiac catheterization      approx 30 years ago  . Amputation Right 11/10/2012    Procedure: AMPUTATION FIRST and SECOND TOES Right Foot;  Surgeon: Elam Dutch, MD;  Location: [facility] OR;  Service: Vascular;  Laterality: Right;  . Transmetatarsal amputation Left 12/16/2012    Procedure: TRANSMETATARSAL AMPUTATION AND VAC PLACEMENT;  Surgeon: Elam Dutch, MD;  Location: [place];  Service: Vascular;  Laterality: Left;  . Amputation Left 04/07/2013    Procedure: AMPUTATION DIGIT- LEFT 1ST TOE;  Surgeon: Mal Misty, MD;  Location: [place];  Service: Vascular;  Laterality: Left;  . Tee without cardioversion N/A 04/20/2013    Procedure: TRANSESOPHAGEAL ECHOCARDIOGRAM (TEE);  Surgeon: Josue Hector, MD;  Location: [facility] ENDOSCOPY;  Service: Cardiovascular;  Laterality: N/A;  . I&d extremity Left 04/22/2013    Procedure: [id number] JFH DEBRIDEMENT LEFT FIRST TOE AMPUTATION WOUND ;  Surgeon: Mal Misty, MD;  Location: [place];  Service: Vascular;  Laterality: Left;  . Amputation Left 04/26/2013    Procedure: Left Foot Transmetatarsal Amputation;  Surgeon: Newt Minion, MD;  Location: [place];  Service: Orthopedics;  Laterality: Left;  . Toe amputation      D/C 04-30-13  . Below knee leg amputation  Left 05/14/2013    DR [name]  . Amputation Left 05/14/2013    Procedure: AMPUTATION BELOW KNEE;  Surgeon: Newt Minion, MD;  Location: [place];  Service: Orthopedics;  Laterality: Left;  Left Below Knee Amputation  . Eye surgery Bilateral     CATARACTS  . Amputation Left 07/23/2013    Procedure: AMPUTATION BELOW KNEE;  Surgeon: Newt Minion, MD;  Location: [place];  Service: Orthopedics;  Laterality: Left;  Left Below Knee Amputation Revision  . Abdominal aortagram Bilateral 11/06/2012    Procedure: ABDOMINAL AORTAGRAM;  Surgeon: Elam Dutch, MD;  Location: [facility] CATH LAB;  Service: Cardiovascular;  Laterality: Bilateral;  . I&d extremity Right 01/05/2014    Procedure: IRRIGATION AND DEBRIDEMENT Right Heel Ulcer;  Surgeon: Mcarthur Rossetti, MD;  Location: [place];  Service: Orthopedics;  Laterality: Right;  Surgeon Available after 5PM  . Below knee leg amputation Right 02/18/2014    dr duda  . Amputation Right 02/18/2014    Procedure: AMPUTATION BELOW KNEE;  Surgeon: Newt Minion, MD;  Location: [place];  Service: Orthopedics;  Laterality: Right;  . Stump revision Right 05/20/2014    Procedure: Revision Right Below Knee Amputation;  Surgeon: Newt Minion, MD;  Location: [place];  Service: Orthopedics;  Laterality: Right;  . Repair quadriceps/hamstring muscles Left 05/20/2014    Procedure: Left Hamstring Release;  Surgeon: Newt Minion, MD;  Location: [place];  Service: Orthopedics;  Laterality: Left;    There were no vitals filed for this visit.  Visit Diagnosis:  Abnormality of gait  Unsteadiness  Balance problems  Activity intolerance  Weakness of both legs  Decreased functional activity tolerance  Encounter for prosthetic gait training  Decreased range of knee movement, unspecified laterality      Subjective Assessment - 03/30/15 0804    Subjective He went to [facility] and is enjoying it.    Patient is accompained by: Family member   Pertinent History ESRD, Bil. Transtibial  Amputations, HTN, Type 1 DM, asthma, multiple myelomas, hyperparathyroidism, glaucoma, gout, PVD   Patient Stated Goals He wants to walk more and get out of w/c.    Currently in Pain? No/denies                         [facility] Adult PT Treatment/Exercise - 03/30/15 0800    Transfers   Transfers Sit to Stand;Stand to Sit   Sit to Stand 4: Min assist;4: Min guard;With upper extremity assist;With armrests;From chair/3-in-1  to rollator walker, from rollator seat   Sit to Stand Details (indicate cue type and reason) PT demo, instructed sitting on rollator seat including 180* turn and cued during transfer   Stand to Sit 5: Supervision;4: Min  assist;With upper extremity assist;With armrests;To chair/3-in-1  rollator walker, seat on rollator   Stand to Sit Details PT demo, instructed sitting  on rollator seat including 180*    Ambulation/Gait   Ambulation/Gait Yes   Ambulation/Gait Assistance 5: Supervision;4: Min guard  Min guard for initial gait, progressed to SBA   Ambulation/Gait Assistance Details PT demo, instructed in technique with rollator walker including brakes, position,    Ambulation Distance (Feet) 80 Feet  80' X 2   Assistive device Prostheses;Rollator   Gait Pattern Step-through pattern;Decreased stride length;Decreased step length - right;Decreased step length - left;Right flexed knee in stance;Left flexed knee in stance;Trunk flexed  bil hip flexion with standing   Ambulation Surface Indoor;Level   Ramp 4: Min assist  rollator walker & prostheses   Ramp Details (indicate cue type and reason) PT demo, instructed technique prior and verbal cues during   Curb 4: Min assist  rollator walker & prostheses   Curb Details (indicate cue type and reason) PT demo, instructed technique prior and verbal cues during   Prosthetics   Current prosthetic wear tolerance (days/week)  7 days /wk   Current prosthetic wear tolerance (#hours/day)  all awake hours except during  dialysis or bathing   Residual limb condition  intact per pt report                  PT Short Term Goals - 03/16/15 1037    PT SHORT TERM GOAL #1   Title Patient verbalizes understanding of updated HEP to progress activity tolerance. (Target Date: 03/16/2015)   Baseline MET 03/16/2015    Time 4   Period Weeks   Status Achieved   PT SHORT TERM GOAL #2   Title Patient ambulates 100' with RW & prostheses with supervision.  (Target Date: 03/16/2015)   Baseline MET 03/16/2015 Patient ambulates 125' with RW with SBA   Time 4   Period Weeks   Status Achieved   PT SHORT TERM GOAL #3   Title Patient negotiates ramps & curbs with rolling walker & prostheses with moderate assist.  (Target Date: 03/16/2015)   Baseline MET 03/16/2015   Time 4   Period Weeks   Status Achieved   PT SHORT TERM GOAL #4   Title Patient reaches 4" beyond length of his arm anteriorly and to ankle level towards ground with RW support with supervision.  (Target Date: 03/16/2015)   Baseline Partially MET Patient requires contact assist for safety to reach 4" anteriorly and to ankle level.    Time 4   Period Weeks   Status Achieved           PT Long Term Goals - 03/16/15 1039    PT LONG TERM GOAL #1   Title Patient verbalizes understanding of ongoing HEP including [employer] to progress strength, flexibility, balance & endurance.  (Target Date: 04/14/2015)   Time 8   Period Weeks   Status On-going   PT LONG TERM GOAL #2   Title TImed Up & Go with rolling walker <45 seconds modified independent to indicate lower fall risk.  (Target Date: 04/14/2015)   Time 8   Period Weeks   Status On-going   PT LONG TERM GOAL #3   Title Patient ambulates 125' with supervision with RW & prostheses including negotiating around obstacles for limited community mobility.  (Target Date: 04/14/2015)   Time 8   Period Weeks   Status On-going   PT LONG TERM GOAL #4   Title Patient negotiates  ramps & curbs with RW and stairs with 2 rails with  minimal assist from family to enable community access.  (Target Date: 04/14/2015)   Time 8   Period Weeks   Status On-going   PT LONG TERM GOAL #5   Title Patient reaches 5" all directions and to floor with rolling walker support safely modified independent.  (Target Date: 04/14/2015)   Time 8   Period Weeks   Status On-going               Plan - 03/30/15 0906    Clinical Impression Statement Use of rollator walker with seat compared to standard RW would increase his activity level enabling a resting point as needed, carry items on seat and requires more balance from patient. Patient verbalized agreement and plans to look for one to purchase.    Pt will benefit from skilled therapeutic intervention in order to improve on the following deficits Abnormal gait;Decreased activity tolerance;Decreased balance;Decreased endurance;Decreased knowledge of use of DME;Decreased mobility;Decreased range of motion;Decreased strength;Prosthetic Dependency   Rehab Potential Good   Clinical Impairments Affecting Rehab Potential Dialysis limits strength and endurance with slow gains.    PT Frequency 2x / week   PT Duration 8 weeks   PT Treatment/Interventions ADLs/Self Care Home Management;DME Instruction;Gait training;Functional mobility training;Therapeutic activities;Therapeutic exercise;Balance training;Neuromuscular re-education;Patient/family education;Prosthetic Training;Passive range of motion;Stair training   PT Next Visit Plan Continue with rollator walker gaiit  training, balance and strengthening activities - core strength in considerable need of work.   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 Working with trainer 1-2 times a week. Stll doing his HEP at home as well.   Consulted and Agree with Plan of Care Patient   Family Member Consulted dtr, Cora        Problem List Patient Active Problem List   Diagnosis Date Noted  . Complications,  amputation stump late ([place]) 05/20/2014  . Weakness generalized 04/07/2014  . Sepsis ([place]) 04/07/2014  . Decubitus ulcer, stage II 04/07/2014  . Decubitus ulcer of ankle   . Fatigue   . Wound infection ([place]) 04/06/2014  . Decubitus ulcer of sacral region, stage 2 03/07/2014  . Glaucoma 03/07/2014  . Gout 03/07/2014  . Below knee amputation status ([place]) 02/18/2014  . Osteomyelitis ([place]) 01/04/2014  . Phantom limb ([place]) 12/14/2013  . Type 1 diabetes mellitus with diabetic foot infection ([place])   . Diabetes mellitus with peripheral vascular disease ([place])   . Asthma 05/17/2013  . History of MRSA infection 04/22/2013  . Peripheral vascular disease ([place]) 11/19/2012  . End-stage renal disease on hemodialysis ([place]) 05/05/2012  . Kahler disease ([place]) 03/30/2012  . Anemia in chronic kidney disease 03/19/2012  . Hypothyroidism 03/19/2012  . Anemia, iron deficiency 03/19/2012  . Hypertension 03/18/2012  . Chronic kidney disease (CKD), stage V ([place]) 03/18/2012  . Cardiac conduction disorder 03/18/2012  . MGUS (monoclonal gammopathy of unknown significance) 02/28/2011  . Monoclonal paraproteinemia 02/28/2011    Jamey Reas PT, DPT 03/30/2015, 9:25 AM  [facility] [address] [place], [place], [address] Phone: [phone]   Fax:  [phone]  Name: Jack [name] MRN: [id number] Date of Birth: 07/08/1945

## 2015-04-04 ENCOUNTER — Ambulatory Visit: Payer: Medicare Other | Admitting: Physical Therapy

## 2015-04-04 ENCOUNTER — Encounter: Payer: Self-pay | Admitting: Physical Therapy

## 2015-04-04 DIAGNOSIS — R269 Unspecified abnormalities of gait and mobility: Secondary | ICD-10-CM

## 2015-04-04 DIAGNOSIS — R2681 Unsteadiness on feet: Secondary | ICD-10-CM

## 2015-04-04 DIAGNOSIS — R6889 Other general symptoms and signs: Secondary | ICD-10-CM

## 2015-04-04 DIAGNOSIS — Z4789 Encounter for other orthopedic aftercare: Secondary | ICD-10-CM

## 2015-04-04 NOTE — Therapy (Signed)
[facility] [address] [place], [place], [address] Phone: [phone]   Fax:  [phone]  Physical Therapy Treatment  Patient Details  Name: Jack [name] MRN: [id number] Date of Birth: Feb 10, 1945 Referring Provider: Meridee Score, MD  Encounter Date: 04/04/2015      PT End of Session - 04/04/15 0853    Visit Number 13   Number of Visits 17   Date for PT Re-Evaluation 04/13/15   Authorization Type G-code & progress note every 10 visits   PT Start Time 0802   PT Stop Time 0845   PT Time Calculation (min) 43 min   Equipment Utilized During Treatment Gait belt   Activity Tolerance Patient tolerated treatment well;Patient limited by fatigue   Behavior During Therapy [facility] for tasks assessed/performed      Past Medical History  Diagnosis Date  . Hypertension   . Pneumonia     2012  . Heart murmur   . Glaucoma   . Multiple myeloma, without mention of having achieved remission 03/30/2012    Cytogenetic neg on 03/23/2012.  . Asthma   . Hyperparathyroidism, secondary renal ([place])   . Peripheral arterial disease ([place])   . End-stage renal disease on hemodialysis [facility])     Started HD March 2014.  Cause of ESRD was DM.  Gets HD at [facility] on [address] on MWF schedule.   . Thyroid disease     hyperparathyroidism  . MRSA bacteremia   . Anemia   . Peripheral vascular disease, unspecified ([place]) 11/19/2012    In the past had R foot toe amps then R TMA. In 2015 had left foot toe amp > then TMA >then L BKA on 05/14/13   . History of MRSA infection 04/22/2013    Bacteremia assoc w L foot wound infection Mar 2015   . Gangrene of foot ([place])   . ESRD on hemodialysis ([place])   . Diabetes mellitus with peripheral vascular disease ([place])   . Hypothyroidism   . Decubitus ulcer of sacral region, stage 2 03/07/2014  . Glaucoma 03/07/2014  . Gangrene ([place])     right BKA  . Contracture of joint     left knee    Past Surgical  History  Procedure Laterality Date  . Thyroidectomy    . Cervical disc surgery    . Insertion of dialysis catheter Right 03/19/2012    Procedure: INSERTION OF DIALYSIS CATHETER;  Surgeon: Mal Misty, MD;  Location: [place];  Service: Vascular;  Laterality: Right;  Right Internal Jugular  . Av fistula placement Left 03/25/2012    Procedure: ARTERIOVENOUS (AV) FISTULA CREATION;  Surgeon: Mal Misty, MD;  Location: [place];  Service: Vascular;  Laterality: Left;  . Ligation of competing branches of arteriovenous fistula Left 05/08/2012    Procedure: LIGATION OF COMPETING BRANCHES OF ARTERIOVENOUS FISTULA;  Surgeon: Mal Misty, MD;  Location: [place];  Service: Vascular;  Laterality: Left;  Ultrasound guided  . Cardiac catheterization      approx 30 years ago  . Amputation Right 11/10/2012    Procedure: AMPUTATION FIRST and SECOND TOES Right Foot;  Surgeon: Elam Dutch, MD;  Location: [facility] OR;  Service: Vascular;  Laterality: Right;  . Transmetatarsal amputation Left 12/16/2012    Procedure: TRANSMETATARSAL AMPUTATION AND VAC PLACEMENT;  Surgeon: Elam Dutch, MD;  Location: [place];  Service: Vascular;  Laterality: Left;  . Amputation Left 04/07/2013    Procedure: AMPUTATION DIGIT- LEFT 1ST TOE;  Surgeon: Mal Misty, MD;  Location: [place];  Service: Vascular;  Laterality: Left;  . Tee without cardioversion N/A 04/20/2013    Procedure: TRANSESOPHAGEAL ECHOCARDIOGRAM (TEE);  Surgeon: Josue Hector, MD;  Location[facility];  Service: Cardiovascular;  Laterality: N/A;  . I&d extremity Left 04/22/2013    Procedure: [id number] YIA DEBRIDEMENT LEFT FIRST TOE AMPUTATION WOUND ;  Surgeon: Mal Misty, MD;  Location: [place];  Service: Vascular;  Laterality: Left;  . Amputation Left 04/26/2013    Procedure: Left Foot Transmetatarsal Amputation;  Surgeon: Newt Minion, MD;  Location: [place];  Service: Orthopedics;  Laterality: Left;  . Toe amputation      D/C 04-30-13  . Below knee leg amputation  Left 05/14/2013    DR [name]  . Amputation Left 05/14/2013    Procedure: AMPUTATION BELOW KNEE;  Surgeon: Newt Minion, MD;  Location: [place];  Service: Orthopedics;  Laterality: Left;  Left Below Knee Amputation  . Eye surgery Bilateral     CATARACTS  . Amputation Left 07/23/2013    Procedure: AMPUTATION BELOW KNEE;  Surgeon: Newt Minion, MD;  Location: [place];  Service: Orthopedics;  Laterality: Left;  Left Below Knee Amputation Revision  . Abdominal aortagram Bilateral 11/06/2012    Procedure: ABDOMINAL AORTAGRAM;  Surgeon: Elam Dutch, MD;  Location: [facility] CATH LAB;  Service: Cardiovascular;  Laterality: Bilateral;  . I&d extremity Right 01/05/2014    Procedure: IRRIGATION AND DEBRIDEMENT Right Heel Ulcer;  Surgeon: Mcarthur Rossetti, MD;  Location: [place];  Service: Orthopedics;  Laterality: Right;  Surgeon Available after 5PM  . Below knee leg amputation Right 02/18/2014    dr duda  . Amputation Right 02/18/2014    Procedure: AMPUTATION BELOW KNEE;  Surgeon: Newt Minion, MD;  Location: [place];  Service: Orthopedics;  Laterality: Right;  . Stump revision Right 05/20/2014    Procedure: Revision Right Below Knee Amputation;  Surgeon: Newt Minion, MD;  Location: [place];  Service: Orthopedics;  Laterality: Right;  . Repair quadriceps/hamstring muscles Left 05/20/2014    Procedure: Left Hamstring Release;  Surgeon: Newt Minion, MD;  Location: [place];  Service: Orthopedics;  Laterality: Left;    There were no vitals filed for this visit.  Visit Diagnosis:  Abnormality of gait  Unsteadiness  Decreased functional activity tolerance  Encounter for prosthetic gait training     04/04/15 0804  Symptoms/Limitations  Subjective No new falls. Patient plans to return to [facility] this weekend.  Patient is accompained by: Family member  Pertinent History ESRD, Bil. Transtibial Amputations, HTN, Type 1 DM, asthma, multiple myelomas, hyperparathyroidism, glaucoma, gout, PVD  Patient Stated Goals He  wants to walk more and get out of w/c.   Pain Assessment  Currently in Pain? No/denies     04/04/15 1655  Ambulation/Gait  Ambulation/Gait Yes  Ambulation/Gait Assistance 5: Supervision;4: Min guard  Ambulation/Gait Assistance Details Patient demo correct technique with rollator with brakes and position, but required continuous verbal cues for maintaining erect posture and forward gaze.  Ambulation Distance (Feet) 60 Feet (x4)  Assistive device Prostheses;Rollator  Gait Pattern Step-through pattern;Decreased stride length;Decreased step length - right;Decreased step length - left;Right flexed knee in stance;Left flexed knee in stance;Trunk flexed  Ambulation Surface Level;Indoor  Exercises  Exercises Other Exercises  Other Exercises  Sitting on stool in // bars: forward/reverse lean, lateral lean L/R, core rotation all while holding weighted green ball x 10 reps each to improve core strength.  Prosthetics  Current prosthetic wear tolerance (days/week)  7 days /wk  Current prosthetic wear tolerance (#hours/day)  all awake hours except during dialysis or bathing  Residual limb condition  intact per pt report          PT Short Term Goals - 03/16/15 1037    PT SHORT TERM GOAL #1   Title Patient verbalizes understanding of updated HEP to progress activity tolerance. (Target Date: 03/16/2015)   Baseline MET 03/16/2015    Time 4   Period Weeks   Status Achieved   PT SHORT TERM GOAL #2   Title Patient ambulates 100' with RW & prostheses with supervision.  (Target Date: 03/16/2015)   Baseline MET 03/16/2015 Patient ambulates 125' with RW with SBA   Time 4   Period Weeks   Status Achieved   PT SHORT TERM GOAL #3   Title Patient negotiates ramps & curbs with rolling walker & prostheses with moderate assist.  (Target Date: 03/16/2015)   Baseline MET 03/16/2015   Time 4   Period Weeks   Status Achieved   PT SHORT TERM GOAL #4   Title Patient reaches 4" beyond length of his arm anteriorly and  to ankle level towards ground with RW support with supervision.  (Target Date: 03/16/2015)   Baseline Partially MET Patient requires contact assist for safety to reach 4" anteriorly and to ankle level.    Time 4   Period Weeks   Status Achieved          PT Long Term Goals - 03/16/15 1039    PT LONG TERM GOAL #1   Title Patient verbalizes understanding of ongoing HEP including [employer] to progress strength, flexibility, balance & endurance.  (Target Date: 04/14/2015)   Time 8   Period Weeks   Status On-going   PT LONG TERM GOAL #2   Title TImed Up & Go with rolling walker <45 seconds modified independent to indicate lower fall risk.  (Target Date: 04/14/2015)   Time 8   Period Weeks   Status On-going   PT LONG TERM GOAL #3   Title Patient ambulates 125' with supervision with RW & prostheses including negotiating around obstacles for limited community mobility.  (Target Date: 04/14/2015)   Time 8   Period Weeks   Status On-going   PT LONG TERM GOAL #4   Title Patient negotiates ramps & curbs with RW and stairs with 2 rails with minimal assist from family to enable community access.  (Target Date: 04/14/2015)   Time 8   Period Weeks   Status On-going   PT LONG TERM GOAL #5   Title Patient reaches 5" all directions and to floor with rolling walker support safely modified independent.  (Target Date: 04/14/2015)   Time 8   Period Weeks   Status On-going          Plan - 04/04/15 0854    Clinical Impression Statement Skilled session addressing deficits in gait and core strength. Patient still limited by fatigue but was able to complete four gait trials with rollator. Patient verbalizes enthusiasm for fitness program at [facility] to maximize gains made in physical therapy. Patient is making progress toward goals.   Pt will benefit from skilled therapeutic intervention in order to improve on the following deficits Abnormal gait;Decreased activity tolerance;Decreased balance;Decreased  endurance;Decreased knowledge of use of DME;Decreased mobility;Decreased range of motion;Decreased strength;Prosthetic Dependency   Rehab Potential Good   Clinical Impairments Affecting Rehab Potential Dialysis limits strength and endurance with slow  gains.    PT Frequency 2x / week   PT Duration 8 weeks   PT Treatment/Interventions ADLs/Self Care Home Management;DME Instruction;Gait training;Functional mobility training;Therapeutic activities;Therapeutic exercise;Balance training;Neuromuscular re-education;Patient/family education;Prosthetic Training;Passive range of motion;Stair training   PT Next Visit Plan Continue with rollator walker gaiit  training, balance and strengthening activities - core strength in considerable need of work.   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 Working with trainer 1-2 times a week. Stll doing his HEP at home as well.   Consulted and Agree with Plan of Care Patient   Family Member Consulted dtr, Cora      Problem List Patient Active Problem List   Diagnosis Date Noted  . Complications, amputation stump late ([place]) 05/20/2014  . Weakness generalized 04/07/2014  . Sepsis ([place]) 04/07/2014  . Decubitus ulcer, stage II 04/07/2014  . Decubitus ulcer of ankle   . Fatigue   . Wound infection ([place]) 04/06/2014  . Decubitus ulcer of sacral region, stage 2 03/07/2014  . Glaucoma 03/07/2014  . Gout 03/07/2014  . Below knee amputation status ([place]) 02/18/2014  . Osteomyelitis ([place]) 01/04/2014  . Phantom limb ([place]) 12/14/2013  . Type 1 diabetes mellitus with diabetic foot infection ([place])   . Diabetes mellitus with peripheral vascular disease ([place])   . Asthma 05/17/2013  . History of MRSA infection 04/22/2013  . Peripheral vascular disease ([place]) 11/19/2012  . End-stage renal disease on hemodialysis ([place]) 05/05/2012  . Kahler disease ([place]) 03/30/2012  . Anemia in chronic kidney disease 03/19/2012  . Hypothyroidism  03/19/2012  . Anemia, iron deficiency 03/19/2012  . Hypertension 03/18/2012  . Chronic kidney disease (CKD), stage V ([name]) 03/18/2012  . Cardiac conduction disorder 03/18/2012  . MGUS (monoclonal gammopathy of unknown significance) 02/28/2011  . Monoclonal paraproteinemia 02/28/2011    Bayard Beaver, SPTA 04/05/2015, 4:10 PM  [facility] [address] [place], [place], [address] Phone: [phone]   Fax:  [phone]  Name: Jack [name] MRN: [id number] Date of Birth: [birth year]  This note has been reviewed and edited by supervising CI.  Willow Ora, PTA, [place] [address] [place], [place] [address] [phone] 04/05/2015, 10:10 PM

## 2015-04-06 ENCOUNTER — Ambulatory Visit: Payer: Medicare Other | Admitting: Physical Therapy

## 2015-04-06 ENCOUNTER — Encounter: Payer: Self-pay | Admitting: Physical Therapy

## 2015-04-06 DIAGNOSIS — R269 Unspecified abnormalities of gait and mobility: Secondary | ICD-10-CM | POA: Diagnosis not present

## 2015-04-06 DIAGNOSIS — R2681 Unsteadiness on feet: Secondary | ICD-10-CM

## 2015-04-06 DIAGNOSIS — R6889 Other general symptoms and signs: Secondary | ICD-10-CM

## 2015-04-06 DIAGNOSIS — Z4789 Encounter for other orthopedic aftercare: Secondary | ICD-10-CM

## 2015-04-06 NOTE — Therapy (Signed)
Jack [name] [address] [name], [place], [address] Phone: [phone]   Fax:  [phone]  Physical Therapy Treatment  Patient Details  Name: Jack [name] MRN: [id number] Date of Birth: 1945/07/14 Referring Provider: Meridee Score, MD  Encounter Date: 04/06/2015      PT End of Session - 04/06/15 0955    Visit Number 14   Number of Visits 17   Date for PT Re-Evaluation 04/13/15   Authorization Type G-code & progress note every 10 visits   PT Start Time 0803   PT Stop Time 0845   PT Time Calculation (min) 42 min   Equipment Utilized During Treatment Gait belt   Activity Tolerance Patient tolerated treatment well;Patient limited by fatigue   Behavior During Therapy [facility] for tasks assessed/performed      Past Medical History  Diagnosis Date  . Hypertension   . Pneumonia     2012  . Heart murmur   . Glaucoma   . Multiple myeloma, without mention of having achieved remission 03/30/2012    Cytogenetic neg on 03/23/2012.  . Asthma   . Hyperparathyroidism, secondary renal ([place])   . Peripheral arterial disease ([place])   . End-stage renal disease on hemodialysis [facility])     Started HD March 2014.  Cause of ESRD was DM.  Gets HD at [facility] on [address] on MWF schedule.   . Thyroid disease     hyperparathyroidism  . MRSA bacteremia   . Anemia   . Peripheral vascular disease, unspecified ([place]) 11/19/2012    In the past had R foot toe amps then R TMA. In 2015 had left foot toe amp > then TMA >then L BKA on 05/14/13   . History of MRSA infection 04/22/2013    Bacteremia assoc w L foot wound infection Mar 2015   . Gangrene of foot ([place])   . ESRD on hemodialysis ([place])   . Diabetes mellitus with peripheral vascular disease ([place])   . Hypothyroidism   . Decubitus ulcer of sacral region, stage 2 03/07/2014  . Glaucoma 03/07/2014  . Gangrene ([place])     right BKA  . Contracture of joint     left knee    Past Surgical  History  Procedure Laterality Date  . Thyroidectomy    . Cervical disc surgery    . Insertion of dialysis catheter Right 03/19/2012    Procedure: INSERTION OF DIALYSIS CATHETER;  Surgeon: Mal Misty, MD;  Location: [place];  Service: Vascular;  Laterality: Right;  Right Internal Jugular  . Av fistula placement Left 03/25/2012    Procedure: ARTERIOVENOUS (AV) FISTULA CREATION;  Surgeon: Mal Misty, MD;  Location: [place];  Service: Vascular;  Laterality: Left;  . Ligation of competing branches of arteriovenous fistula Left 05/08/2012    Procedure: LIGATION OF COMPETING BRANCHES OF ARTERIOVENOUS FISTULA;  Surgeon: Mal Misty, MD;  Location: [place];  Service: Vascular;  Laterality: Left;  Ultrasound guided  . Cardiac catheterization      approx 30 years ago  . Amputation Right 11/10/2012    Procedure: AMPUTATION FIRST and SECOND TOES Right Foot;  Surgeon: Elam Dutch, MD;  Location: [facility] OR;  Service: Vascular;  Laterality: Right;  . Transmetatarsal amputation Left 12/16/2012    Procedure: TRANSMETATARSAL AMPUTATION AND VAC PLACEMENT;  Surgeon: Elam Dutch, MD;  Location: [place];  Service: Vascular;  Laterality: Left;  . Amputation Left 04/07/2013    Procedure: AMPUTATION DIGIT- LEFT 1ST TOE;  Surgeon: Mal Misty, MD;  Location: [place];  Service: Vascular;  Laterality: Left;  . Tee without cardioversion N/A 04/20/2013    Procedure: TRANSESOPHAGEAL ECHOCARDIOGRAM (TEE);  Surgeon: Josue Hector, MD;  Location: [facility] ENDOSCOPY;  Service: Cardiovascular;  Laterality: N/A;  . I&d extremity Left 04/22/2013    Procedure: [id number] PFX DEBRIDEMENT LEFT FIRST TOE AMPUTATION WOUND ;  Surgeon: Mal Misty, MD;  Location: [place];  Service: Vascular;  Laterality: Left;  . Amputation Left 04/26/2013    Procedure: Left Foot Transmetatarsal Amputation;  Surgeon: Newt Minion, MD;  Location: [place];  Service: Orthopedics;  Laterality: Left;  . Toe amputation      D/C 04-30-13  . Below knee leg amputation  Left 05/14/2013    DR [name]  . Amputation Left 05/14/2013    Procedure: AMPUTATION BELOW KNEE;  Surgeon: Newt Minion, MD;  Location: [place];  Service: Orthopedics;  Laterality: Left;  Left Below Knee Amputation  . Eye surgery Bilateral     CATARACTS  . Amputation Left 07/23/2013    Procedure: AMPUTATION BELOW KNEE;  Surgeon: Newt Minion, MD;  Location: [place];  Service: Orthopedics;  Laterality: Left;  Left Below Knee Amputation Revision  . Abdominal aortagram Bilateral 11/06/2012    Procedure: ABDOMINAL AORTAGRAM;  Surgeon: Elam Dutch, MD;  Location: [facility] CATH LAB;  Service: Cardiovascular;  Laterality: Bilateral;  . I&d extremity Right 01/05/2014    Procedure: IRRIGATION AND DEBRIDEMENT Right Heel Ulcer;  Surgeon: Mcarthur Rossetti, MD;  Location: [place];  Service: Orthopedics;  Laterality: Right;  Surgeon Available after 5PM  . Below knee leg amputation Right 02/18/2014    dr duda  . Amputation Right 02/18/2014    Procedure: AMPUTATION BELOW KNEE;  Surgeon: Newt Minion, MD;  Location: [place];  Service: Orthopedics;  Laterality: Right;  . Stump revision Right 05/20/2014    Procedure: Revision Right Below Knee Amputation;  Surgeon: Newt Minion, MD;  Location: [place];  Service: Orthopedics;  Laterality: Right;  . Repair quadriceps/hamstring muscles Left 05/20/2014    Procedure: Left Hamstring Release;  Surgeon: Newt Minion, MD;  Location: [place];  Service: Orthopedics;  Laterality: Left;    There were no vitals filed for this visit.  Visit Diagnosis:  Unsteadiness  Abnormality of gait  Encounter for prosthetic gait training  Decreased functional activity tolerance      Subjective Assessment - 04/06/15 0949    Subjective No new falls or pain. Patient plans to return to [facility] this weekend and is enthusiastic about activity.    Patient is accompained by: Family member   Pertinent History ESRD, Bil. Transtibial Amputations, HTN, Type 1 DM, asthma, multiple myelomas,  hyperparathyroidism, glaucoma, gout, PVD   Patient Stated Goals He wants to walk more and get out of w/c.    Currently in Pain? No/denies          [facility] Adult PT Treatment/Exercise - 04/06/15 0816    Ambulation/Gait   Ambulation/Gait Yes   Ambulation/Gait Assistance 4: Min guard;4: Min assist   Ambulation/Gait Assistance Details Verbal cues to increase forward gaze and correct flexed posture.   Ambulation Distance (Feet) 120 Feet  x1, 60x2   Assistive device Prostheses;Rollator   Gait Pattern Step-through pattern;Decreased stride length;Decreased step length - right;Decreased step length - left;Right flexed knee in stance;Left flexed knee in stance;Trunk flexed   Ambulation Surface Level;Indoor   Exercises   Exercises Other Exercises   Other Exercises  Sitting  on stool in // bars: forward/reverse lean, lateral lean L/R, core rotation all while holding weighted blue ball x 10 reps each to improve core strength. Seated rows with target push with clinician for 2 sets of 20 reps.          PT Short Term Goals - 03/16/15 1037    PT SHORT TERM GOAL #1   Title Patient verbalizes understanding of updated HEP to progress activity tolerance. (Target Date: 03/16/2015)   Baseline MET 03/16/2015    Time 4   Period Weeks   Status Achieved   PT SHORT TERM GOAL #2   Title Patient ambulates 100' with RW & prostheses with supervision.  (Target Date: 03/16/2015)   Baseline MET 03/16/2015 Patient ambulates 125' with RW with SBA   Time 4   Period Weeks   Status Achieved   PT SHORT TERM GOAL #3   Title Patient negotiates ramps & curbs with rolling walker & prostheses with moderate assist.  (Target Date: 03/16/2015)   Baseline MET 03/16/2015   Time 4   Period Weeks   Status Achieved   PT SHORT TERM GOAL #4   Title Patient reaches 4" beyond length of his arm anteriorly and to ankle level towards ground with RW support with supervision.  (Target Date: 03/16/2015)   Baseline Partially MET Patient requires  contact assist for safety to reach 4" anteriorly and to ankle level.    Time 4   Period Weeks   Status Achieved          PT Long Term Goals - 03/16/15 1039    PT LONG TERM GOAL #1   Title Patient verbalizes understanding of ongoing HEP including [employer] to progress strength, flexibility, balance & endurance.  (Target Date: 04/14/2015)   Time 8   Period Weeks   Status On-going   PT LONG TERM GOAL #2   Title TImed Up & Go with rolling walker <45 seconds modified independent to indicate lower fall risk.  (Target Date: 04/14/2015)   Time 8   Period Weeks   Status On-going   PT LONG TERM GOAL #3   Title Patient ambulates 125' with supervision with RW & prostheses including negotiating around obstacles for limited community mobility.  (Target Date: 04/14/2015)   Time 8   Period Weeks   Status On-going   PT LONG TERM GOAL #4   Title Patient negotiates ramps & curbs with RW and stairs with 2 rails with minimal assist from family to enable community access.  (Target Date: 04/14/2015)   Time 8   Period Weeks   Status On-going   PT LONG TERM GOAL #5   Title Patient reaches 5" all directions and to floor with rolling walker support safely modified independent.  (Target Date: 04/14/2015)   Time 8   Period Weeks   Status On-going          Plan - 04/06/15 0955    Clinical Impression Statement Skilled session addressing deficits in gait with rollator and core strength. Patient continues to improve activity tolerance ambulating with rollator, up to double distance of previous sessions. Patient continues to be limited by fatigue but is making steady progress toward goals.   Pt will benefit from skilled therapeutic intervention in order to improve on the following deficits Abnormal gait;Decreased activity tolerance;Decreased balance;Decreased endurance;Decreased knowledge of use of DME;Decreased mobility;Decreased range of motion;Decreased strength;Prosthetic Dependency   Rehab Potential Good    Clinical Impairments Affecting Rehab Potential Dialysis limits strength and endurance with slow  gains.    PT Frequency 2x / week   PT Duration 8 weeks   PT Treatment/Interventions ADLs/Self Care Home Management;DME Instruction;Gait training;Functional mobility training;Therapeutic activities;Therapeutic exercise;Balance training;Neuromuscular re-education;Patient/family education;Prosthetic Training;Passive range of motion;Stair training   PT Next Visit Plan Continue with rollator walker gait training, balance, and core strengthening activities - core strength still in need of work. Progress toward LTG's due next week 04/14/15.    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 Working with trainer 1-2 times a week. Stll doing his HEP at home as well.   Consulted and Agree with Plan of Care Patient;Family member/caregiver   Family Member Consulted dtr, Cora      Problem List Patient Active Problem List   Diagnosis Date Noted  . Complications, amputation stump late ([place]) 05/20/2014  . Weakness generalized 04/07/2014  . Sepsis ([place]) 04/07/2014  . Decubitus ulcer, stage II 04/07/2014  . Decubitus ulcer of ankle   . Fatigue   . Wound infection ([place]) 04/06/2014  . Decubitus ulcer of sacral region, stage 2 03/07/2014  . Glaucoma 03/07/2014  . Gout 03/07/2014  . Below knee amputation status ([place]) 02/18/2014  . Osteomyelitis ([place]) 01/04/2014  . Phantom limb ([place]) 12/14/2013  . Type 1 diabetes mellitus with diabetic foot infection ([place])   . Diabetes mellitus with peripheral vascular disease ([place])   . Asthma 05/17/2013  . History of MRSA infection 04/22/2013  . Peripheral vascular disease ([place]) 11/19/2012  . End-stage renal disease on hemodialysis ([place]) 05/05/2012  . Kahler disease ([place]) 03/30/2012  . Anemia in chronic kidney disease 03/19/2012  . Hypothyroidism 03/19/2012  . Anemia, iron deficiency 03/19/2012  . Hypertension 03/18/2012  . Chronic kidney  disease (CKD), stage V ([place]) 03/18/2012  . Cardiac conduction disorder 03/18/2012  . MGUS (monoclonal gammopathy of unknown significance) 02/28/2011  . Monoclonal paraproteinemia 02/28/2011    Bayard Beaver, SPTA 04/06/2015, 10:01 AM  [facility] [address] [place], [place], [address] Phone: [phone]   Fax:  [phone]  Name: Jack [name] MRN: [id number] Date of Birth: [birth year]  This note has been reviewed and edited by supervising CI.  Willow Ora, PTA, [place] [address], [place], [place] [address] [phone] 04/07/2015, 12:20 PM

## 2015-04-11 ENCOUNTER — Encounter: Payer: Self-pay | Admitting: Physical Therapy

## 2015-04-11 ENCOUNTER — Ambulatory Visit: Payer: Medicare Other | Admitting: Physical Therapy

## 2015-04-11 DIAGNOSIS — Z4789 Encounter for other orthopedic aftercare: Secondary | ICD-10-CM

## 2015-04-11 DIAGNOSIS — R269 Unspecified abnormalities of gait and mobility: Secondary | ICD-10-CM | POA: Diagnosis not present

## 2015-04-11 DIAGNOSIS — Z89512 Acquired absence of left leg below knee: Secondary | ICD-10-CM

## 2015-04-11 DIAGNOSIS — R2689 Other abnormalities of gait and mobility: Secondary | ICD-10-CM

## 2015-04-11 DIAGNOSIS — R6889 Other general symptoms and signs: Secondary | ICD-10-CM

## 2015-04-11 DIAGNOSIS — R29898 Other symptoms and signs involving the musculoskeletal system: Secondary | ICD-10-CM

## 2015-04-11 DIAGNOSIS — R2681 Unsteadiness on feet: Secondary | ICD-10-CM

## 2015-04-11 DIAGNOSIS — Z89511 Acquired absence of right leg below knee: Secondary | ICD-10-CM

## 2015-04-11 NOTE — Therapy (Signed)
[facility] [address] [place], [place], [address] Phone: [phone]   Fax:  [phone]  Physical Therapy Treatment  Patient Details  Name: Jack [name] MRN: [id number] Date of Birth: 05/26/1945 Referring Provider: Meridee Score, MD  Encounter Date: 04/11/2015      PT End of Session - 04/11/15 0819    Visit Number 15   Number of Visits 17   Date for PT Re-Evaluation 04/13/15   Authorization Type G-code & progress note every 10 visits   PT Start Time [phone]  Patient arrived late.   PT Stop Time 0845   PT Time Calculation (min) 29 min   Equipment Utilized During Treatment Gait belt   Activity Tolerance Patient tolerated treatment well;Patient limited by fatigue   Behavior During Therapy WFL for tasks assessed/performed      Past Medical History  Diagnosis Date  . Hypertension   . Pneumonia     2012  . Heart murmur   . Glaucoma   . Multiple myeloma, without mention of having achieved remission 03/30/2012    Cytogenetic neg on 03/23/2012.  . Asthma   . Hyperparathyroidism, secondary renal ([place])   . Peripheral arterial disease ([place])   . End-stage renal disease on hemodialysis [facility])     Started HD March 2014.  Cause of ESRD was DM.  Gets HD at [facility] on [place] on MWF schedule.   . Thyroid disease     hyperparathyroidism  . MRSA bacteremia   . Anemia   . Peripheral vascular disease, unspecified ([place]) 11/19/2012    In the past had R foot toe amps then R TMA. In 2015 had left foot toe amp > then TMA >then L BKA on 05/14/13   . History of MRSA infection 04/22/2013    Bacteremia assoc w L foot wound infection Mar 2015   . Gangrene of foot ([place])   . ESRD on hemodialysis ([place])   . Diabetes mellitus with peripheral vascular disease ([place])   . Hypothyroidism   . Decubitus ulcer of sacral region, stage 2 03/07/2014  . Glaucoma 03/07/2014  . Gangrene ([place])     right BKA  . Contracture of joint     left knee     Past Surgical History  Procedure Laterality Date  . Thyroidectomy    . Cervical disc surgery    . Insertion of dialysis catheter Right 03/19/2012    Procedure: INSERTION OF DIALYSIS CATHETER;  Surgeon: Mal Misty, MD;  Location: [facility];  Service: Vascular;  Laterality: Right;  Right Internal Jugular  . Av fistula placement Left 03/25/2012    Procedure: ARTERIOVENOUS (AV) FISTULA CREATION;  Surgeon: Mal Misty, MD;  Location: [facility];  Service: Vascular;  Laterality: Left;  . Ligation of competing branches of arteriovenous fistula Left 05/08/2012    Procedure: LIGATION OF COMPETING BRANCHES OF ARTERIOVENOUS FISTULA;  Surgeon: Mal Misty, MD;  Location: [place];  Service: Vascular;  Laterality: Left;  Ultrasound guided  . Cardiac catheterization      approx 30 years ago  . Amputation Right 11/10/2012    Procedure: AMPUTATION FIRST and SECOND TOES Right Foot;  Surgeon: Elam Dutch, MD;  Location: [facility] OR;  Service: Vascular;  Laterality: Right;  . Transmetatarsal amputation Left 12/16/2012    Procedure: TRANSMETATARSAL AMPUTATION AND VAC PLACEMENT;  Surgeon: Elam Dutch, MD;  Location: [facility];  Service: Vascular;  Laterality: Left;  . Amputation Left 04/07/2013    Procedure: AMPUTATION  DIGIT- LEFT 1ST TOE;  Surgeon: Mal Misty, MD;  Location: [facility];  Service: Vascular;  Laterality: Left;  . Tee without cardioversion N/A 04/20/2013    Procedure: TRANSESOPHAGEAL ECHOCARDIOGRAM (TEE);  Surgeon: Josue Hector, MD;  Location: [facility] ENDOSCOPY;  Service: Cardiovascular;  Laterality: N/A;  . I&d extremity Left 04/22/2013    Procedure: QPRFFMBWGY KZL DEBRIDEMENT LEFT FIRST TOE AMPUTATION WOUND ;  Surgeon: Mal Misty, MD;  Location: [place];  Service: Vascular;  Laterality: Left;  . Amputation Left 04/26/2013    Procedure: Left Foot Transmetatarsal Amputation;  Surgeon: Newt Minion, MD;  Location: [facility];  Service: Orthopedics;  Laterality: Left;  . Toe amputation      D/C 04-30-13  . Below  knee leg amputation Left 05/14/2013    DR [name]  . Amputation Left 05/14/2013    Procedure: AMPUTATION BELOW KNEE;  Surgeon: Newt Minion, MD;  Location: [facility];  Service: Orthopedics;  Laterality: Left;  Left Below Knee Amputation  . Eye surgery Bilateral     CATARACTS  . Amputation Left 07/23/2013    Procedure: AMPUTATION BELOW KNEE;  Surgeon: Newt Minion, MD;  Location: [facility] [facility];  Service: Orthopedics;  Laterality: Left;  Left Below Knee Amputation Revision  . Abdominal aortagram Bilateral 11/06/2012    Procedure: ABDOMINAL AORTAGRAM;  Surgeon: Elam Dutch, MD;  Location: [facility] CATH LAB;  Service: Cardiovascular;  Laterality: Bilateral;  . I&d extremity Right 01/05/2014    Procedure: IRRIGATION AND DEBRIDEMENT Right Heel Ulcer;  Surgeon: Mcarthur Rossetti, MD;  Location: [facility];  Service: Orthopedics;  Laterality: Right;  Surgeon Available after 5PM  . Below knee leg amputation Right 02/18/2014    dr duda  . Amputation Right 02/18/2014    Procedure: AMPUTATION BELOW KNEE;  Surgeon: Newt Minion, MD;  Location: [place];  Service: Orthopedics;  Laterality: Right;  . Stump revision Right 05/20/2014    Procedure: Revision Right Below Knee Amputation;  Surgeon: Newt Minion, MD;  Location: [place];  Service: Orthopedics;  Laterality: Right;  . Repair quadriceps/hamstring muscles Left 05/20/2014    Procedure: Left Hamstring Release;  Surgeon: Newt Minion, MD;  Location: [place];  Service: Orthopedics;  Laterality: Left;    There were no vitals filed for this visit.  Visit Diagnosis:  Unsteadiness  Abnormality of gait  Encounter for prosthetic gait training  Decreased functional activity tolerance  Balance problems  Activity intolerance  Weakness of both legs  Status post bilateral below knee amputation ([place])  Status post below knee amputation of left lower extremity ([place])      Subjective Assessment - 04/11/15 0818    Subjective Worked out with trainer this past weekend. No falls or  pain to report.    Patient is accompained by: Family member   Pertinent History ESRD, Bil. Transtibial Amputations, HTN, Type 1 DM, asthma, multiple myelomas, hyperparathyroidism, glaucoma, gout, PVD   Patient Stated Goals He wants to walk more and get out of w/c.    Currently in Pain? No/denies   Pain Score 0-No pain          [facility] Adult PT Treatment/Exercise - 04/11/15 0820    Ambulation/Gait   Ambulation/Gait Yes   Ambulation/Gait Assistance 5: Supervision;4: Min guard   Ambulation/Gait Assistance Details Verbal cues for forward gaze and erect posture.   Ambulation Distance (Feet) 150 Feet   Assistive device Prostheses;Rollator   Gait Pattern Step-through pattern;Decreased stride length;Decreased step length - right;Decreased step length -  left;Right flexed knee in stance;Left flexed knee in stance;Trunk flexed   Ambulation Surface Level;Indoor   Ramp 4: Min assist  Up ramp only.   Ramp Details (indicate cue type and reason) Pt demo, instructed in technique prior and verbal cues for sequencing and safety during.   Curb 4: Min assist  Only down curb   Curb Details (indicate cue type and reason) Pt demo, instructed in technique prior with verbal cues for safety and sequencing during.   Prosthetics   Current prosthetic wear tolerance (days/week)  7 days /wk   Current prosthetic wear tolerance (#hours/day)  all awake hours except during dialysis or bathing   Residual limb condition  intact per pt report          PT Short Term Goals - 03/16/15 1037    PT SHORT TERM GOAL #1   Title Patient verbalizes understanding of updated HEP to progress activity tolerance. (Target Date: 03/16/2015)   Baseline MET 03/16/2015    Time 4   Period Weeks   Status Achieved   PT SHORT TERM GOAL #2   Title Patient ambulates 100' with RW & prostheses with supervision.  (Target Date: 03/16/2015)   Baseline MET 03/16/2015 Patient ambulates 125' with RW with SBA   Time 4   Period Weeks   Status Achieved    PT SHORT TERM GOAL #3   Title Patient negotiates ramps & curbs with rolling walker & prostheses with moderate assist.  (Target Date: 03/16/2015)   Baseline MET 03/16/2015   Time 4   Period Weeks   Status Achieved   PT SHORT TERM GOAL #4   Title Patient reaches 4" beyond length of his arm anteriorly and to ankle level towards ground with RW support with supervision.  (Target Date: 03/16/2015)   Baseline Partially MET Patient requires contact assist for safety to reach 4" anteriorly and to ankle level.    Time 4   Period Weeks   Status Achieved          PT Long Term Goals - 04/11/15 0820    PT LONG TERM GOAL #1   Title Patient verbalizes understanding of ongoing HEP including [employer] to progress strength, flexibility, balance & endurance.  (Target Date: 04/14/2015)   Status Achieved   PT LONG TERM GOAL #2   Title TImed Up & Go with rolling walker <45 seconds modified independent to indicate lower fall risk.  (Target Date: 04/14/2015)   Time 8   Period Weeks   Status On-going   PT LONG TERM GOAL #3   Title Patient ambulates 125' with supervision with RW & prostheses including negotiating around obstacles for limited community mobility.  (Target Date: 04/14/2015)   Baseline 04/11/15 Patient ambulates 150' with min/guard with Rollator and prostheses including negotiating around obstacles.   Time 8   Period Weeks   Status On-going   PT LONG TERM GOAL #4   Title Patient negotiates ramps & curbs with RW and stairs with 2 rails with minimal assist from family to enable community access.  (Target Date: 04/14/2015)   Baseline 04/11/15 Patient negotiated up ramp and down curb with rollator with minA and verbal cues for sequencing and technique.   Time 8   Period Weeks   Status Partially Met   PT LONG TERM GOAL #5   Title Patient reaches 5" all directions and to floor with rolling walker support safely modified independent.  (Target Date: 04/14/2015)   Time 8   Period Weeks   Status On-going  Plan - 04/11/15 0819    Clinical Impression Statement Skilled session addressing gait with rollator and prostheses including barriers. Patient able to negotiate furniture with rollator and distances up to 150 feet with supervision. Patient able to negotiate up ramp and down curb with rollator but requires minA and verbal cues for technique and safety. Patient is making progress toward other goals.   Pt will benefit from skilled therapeutic intervention in order to improve on the following deficits Abnormal gait;Decreased activity tolerance;Decreased balance;Decreased endurance;Decreased knowledge of use of DME;Decreased mobility;Decreased range of motion;Decreased strength;Prosthetic Dependency   Rehab Potential Good   Clinical Impairments Affecting Rehab Potential Dialysis limits strength and endurance with slow gains.    PT Frequency 2x / week   PT Duration 8 weeks   PT Treatment/Interventions ADLs/Self Care Home Management;DME Instruction;Gait training;Functional mobility training;Therapeutic activities;Therapeutic exercise;Balance training;Neuromuscular re-education;Patient/family education;Prosthetic Training;Passive range of motion;Stair training   PT Next Visit Plan Assess remaining LTGs for planned discharge at next session.   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 Working with trainer 1-2 times a week and verbalizes feeling progress there. Stll doing his HEP at home as well.   Consulted and Agree with Plan of Care Patient;Family member/caregiver   Family Member Consulted dtr, Cora      Problem List Patient Active Problem List   Diagnosis Date Noted  . Complications, amputation stump late ([place]) 05/20/2014  . Weakness generalized 04/07/2014  . Sepsis ([place]) 04/07/2014  . Decubitus ulcer, stage II 04/07/2014  . Decubitus ulcer of ankle   . Fatigue   . Wound infection ([place]) 04/06/2014  . Decubitus ulcer of sacral region, stage 2 03/07/2014   . Glaucoma 03/07/2014  . Gout 03/07/2014  . Below knee amputation status ([place]) 02/18/2014  . Osteomyelitis ([place]) 01/04/2014  . Phantom limb ([place]) 12/14/2013  . Type 1 diabetes mellitus with diabetic foot infection ([place])   . Diabetes mellitus with peripheral vascular disease ([place])   . Asthma 05/17/2013  . History of MRSA infection 04/22/2013  . Peripheral vascular disease ([place]) 11/19/2012  . End-stage renal disease on hemodialysis ([place]) 05/05/2012  . Kahler disease ([place]) 03/30/2012  . Anemia in chronic kidney disease 03/19/2012  . Hypothyroidism 03/19/2012  . Anemia, iron deficiency 03/19/2012  . Hypertension 03/18/2012  . Chronic kidney disease (CKD), stage V ([place]) 03/18/2012  . Cardiac conduction disorder 03/18/2012  . MGUS (monoclonal gammopathy of unknown significance) 02/28/2011  . Monoclonal paraproteinemia 02/28/2011    Bayard Beaver, SPTA 04/11/2015, 1:26 PM  [facility] [address] [place], [place], [address] Phone: [phone]   Fax:  [phone]  Name: Jack [name] MRN: [id number] Date of Birth: 10/14/1945  This note has been reviewed and edited by supervising CI.  Willow Ora, PTA, [facility] [address] [place], [place] [address] [phone] 04/11/2015, 1:36 PM

## 2015-04-13 ENCOUNTER — Ambulatory Visit: Payer: Medicare Other | Admitting: Physical Therapy

## 2015-04-13 ENCOUNTER — Encounter: Payer: Self-pay | Admitting: Physical Therapy

## 2015-04-13 DIAGNOSIS — R2681 Unsteadiness on feet: Secondary | ICD-10-CM

## 2015-04-13 DIAGNOSIS — Z89511 Acquired absence of right leg below knee: Secondary | ICD-10-CM

## 2015-04-13 DIAGNOSIS — R269 Unspecified abnormalities of gait and mobility: Secondary | ICD-10-CM | POA: Diagnosis not present

## 2015-04-13 DIAGNOSIS — R6889 Other general symptoms and signs: Secondary | ICD-10-CM

## 2015-04-13 DIAGNOSIS — R2689 Other abnormalities of gait and mobility: Secondary | ICD-10-CM

## 2015-04-13 DIAGNOSIS — Z4789 Encounter for other orthopedic aftercare: Secondary | ICD-10-CM

## 2015-04-13 DIAGNOSIS — R29898 Other symptoms and signs involving the musculoskeletal system: Secondary | ICD-10-CM

## 2015-04-13 DIAGNOSIS — Z89512 Acquired absence of left leg below knee: Secondary | ICD-10-CM

## 2015-04-13 NOTE — Therapy (Addendum)
[facility] [address] [place], [place], [address] Phone: [phone]   Fax:  [phone]  Physical Therapy Treatment  Patient Details  Name: Jack [name] MRN: [id number] Date of Birth: 1945/09/16 Referring Provider: Meridee Score, MD  Encounter Date: 04/13/2015      PT End of Session - 04/13/15 1242    Visit Number 16   Number of Visits 17   Date for PT Re-Evaluation 04/13/15   Authorization Type G-code & progress note every 10 visits   PT Start Time 1102   PT Stop Time 1147   PT Time Calculation (min) 45 min   Equipment Utilized During Treatment Gait belt   Activity Tolerance Patient tolerated treatment well;Patient limited by fatigue   Behavior During Therapy [facility] for tasks assessed/performed      Past Medical History  Diagnosis Date  . Hypertension   . Pneumonia     2012  . Heart murmur   . Glaucoma   . Multiple myeloma, without mention of having achieved remission 03/30/2012    Cytogenetic neg on 03/23/2012.  . Asthma   . Hyperparathyroidism, secondary renal ([place])   . Peripheral arterial disease ([place])   . End-stage renal disease on hemodialysis [facility])     Started HD March 2014.  Cause of ESRD was DM.  Gets HD at [facility] on [address] on MWF schedule.   . Thyroid disease     hyperparathyroidism  . MRSA bacteremia   . Anemia   . Peripheral vascular disease, unspecified ([place]) 11/19/2012    In the past had R foot toe amps then R TMA. In 2015 had left foot toe amp > then TMA >then L BKA on 05/14/13   . History of MRSA infection 04/22/2013    Bacteremia assoc w L foot wound infection Mar 2015   . Gangrene of foot ([place])   . ESRD on hemodialysis ([place])   . Diabetes mellitus with peripheral vascular disease ([place])   . Hypothyroidism   . Decubitus ulcer of sacral region, stage 2 03/07/2014  . Glaucoma 03/07/2014  . Gangrene ([place])     right BKA  . Contracture of joint     left knee    Past Surgical  History  Procedure Laterality Date  . Thyroidectomy    . Cervical disc surgery    . Insertion of dialysis catheter Right 03/19/2012    Procedure: INSERTION OF DIALYSIS CATHETER;  Surgeon: Mal Misty, MD;  Location: [place];  Service: Vascular;  Laterality: Right;  Right Internal Jugular  . Av fistula placement Left 03/25/2012    Procedure: ARTERIOVENOUS (AV) FISTULA CREATION;  Surgeon: Mal Misty, MD;  Location: [place];  Service: Vascular;  Laterality: Left;  . Ligation of competing branches of arteriovenous fistula Left 05/08/2012    Procedure: LIGATION OF COMPETING BRANCHES OF ARTERIOVENOUS FISTULA;  Surgeon: Mal Misty, MD;  Location: [place];  Service: Vascular;  Laterality: Left;  Ultrasound guided  . Cardiac catheterization      approx 30 years ago  . Amputation Right 11/10/2012    Procedure: AMPUTATION FIRST and SECOND TOES Right Foot;  Surgeon: Elam Dutch, MD;  Location: [facility] OR;  Service: Vascular;  Laterality: Right;  . Transmetatarsal amputation Left 12/16/2012    Procedure: TRANSMETATARSAL AMPUTATION AND VAC PLACEMENT;  Surgeon: Elam Dutch, MD;  Location: [place];  Service: Vascular;  Laterality: Left;  . Amputation Left 04/07/2013    Procedure: AMPUTATION DIGIT- LEFT 1ST TOE;  Surgeon: Mal Misty, MD;  Location: [place];  Service: Vascular;  Laterality: Left;  . Tee without cardioversion N/A 04/20/2013    Procedure: TRANSESOPHAGEAL ECHOCARDIOGRAM (TEE);  Surgeon: Josue Hector, MD;  Location: [facility] ENDOSCOPY;  Service: Cardiovascular;  Laterality: N/A;  . I&d extremity Left 04/22/2013    Procedure: [id number] AQT DEBRIDEMENT LEFT FIRST TOE AMPUTATION WOUND ;  Surgeon: Mal Misty, MD;  Location: [place];  Service: Vascular;  Laterality: Left;  . Amputation Left 04/26/2013    Procedure: Left Foot Transmetatarsal Amputation;  Surgeon: Newt Minion, MD;  Location: [place];  Service: Orthopedics;  Laterality: Left;  . Toe amputation      D/C 04-30-13  . Below knee leg amputation  Left 05/14/2013    DR [name]  . Amputation Left 05/14/2013    Procedure: AMPUTATION BELOW KNEE;  Surgeon: Newt Minion, MD;  Location: [place];  Service: Orthopedics;  Laterality: Left;  Left Below Knee Amputation  . Eye surgery Bilateral     CATARACTS  . Amputation Left 07/23/2013    Procedure: AMPUTATION BELOW KNEE;  Surgeon: Newt Minion, MD;  Location: [place];  Service: Orthopedics;  Laterality: Left;  Left Below Knee Amputation Revision  . Abdominal aortagram Bilateral 11/06/2012    Procedure: ABDOMINAL AORTAGRAM;  Surgeon: Elam Dutch, MD;  Location: [facility] CATH LAB;  Service: Cardiovascular;  Laterality: Bilateral;  . I&d extremity Right 01/05/2014    Procedure: IRRIGATION AND DEBRIDEMENT Right Heel Ulcer;  Surgeon: Mcarthur Rossetti, MD;  Location: [place];  Service: Orthopedics;  Laterality: Right;  Surgeon Available after 5PM  . Below knee leg amputation Right 02/18/2014    dr duda  . Amputation Right 02/18/2014    Procedure: AMPUTATION BELOW KNEE;  Surgeon: Newt Minion, MD;  Location: [place];  Service: Orthopedics;  Laterality: Right;  . Stump revision Right 05/20/2014    Procedure: Revision Right Below Knee Amputation;  Surgeon: Newt Minion, MD;  Location: [place];  Service: Orthopedics;  Laterality: Right;  . Repair quadriceps/hamstring muscles Left 05/20/2014    Procedure: Left Hamstring Release;  Surgeon: Newt Minion, MD;  Location: [place];  Service: Orthopedics;  Laterality: Left;    There were no vitals filed for this visit.  Visit Diagnosis:  Unsteadiness  Abnormality of gait  Encounter for prosthetic gait training  Decreased functional activity tolerance  Balance problems  Activity intolerance  Weakness of both legs  Status post bilateral below knee amputation ([place])      Subjective Assessment - 04/13/15 1105    Subjective Worked out with trainer this past weekend. No falls or pain to report.    Patient is accompained by: Family member   Pertinent History ESRD,  Bil. Transtibial Amputations, HTN, Type 1 DM, asthma, multiple myelomas, hyperparathyroidism, glaucoma, gout, PVD   Patient Stated Goals He wants to walk more and get out of w/c.    Currently in Pain? No/denies          [facility] Adult PT Treatment/Exercise - 04/13/15 1234    Ambulation/Gait   Ambulation/Gait Yes   Ambulation/Gait Assistance 5: Supervision   Ambulation/Gait Assistance Details Verbal cues for forward gaze and erect posture.   Ambulation Distance (Feet) 125 Feet   Assistive device Prostheses;Rolling walker   Gait Pattern Step-through pattern;Decreased stride length;Decreased step length - right;Decreased step length - left;Right flexed knee in stance;Left flexed knee in stance;Trunk flexed   Ambulation Surface Level;Indoor   Ramp 4: Min assist  Ramp Details (indicate cue type and reason) Verbal cues for safety awareness and erect posture.   Curb 4: Min assist   Curb Details (indicate cue type and reason) Verbal cues for foot position and erect posture.   Balance   Balance Assessed Yes   Dynamic Standing Balance   Reaching for objects comments: standing with RW support: reaches forward, right, across midline, left 6" with supervision, reaches forward & to touch floor with stabilization of RW.    Standardized Balance Assessment   Standardized Balance Assessment Timed Up and Go Test   Timed Up and Go Test   TUG Normal TUG   Normal TUG (seconds) 51.21          PT Short Term Goals - 03/16/15 1037    PT SHORT TERM GOAL #1   Title Patient verbalizes understanding of updated HEP to progress activity tolerance. (Target Date: 03/16/2015)   Baseline MET 03/16/2015    Time 4   Period Weeks   Status Achieved   PT SHORT TERM GOAL #2   Title Patient ambulates 100' with RW & prostheses with supervision.  (Target Date: 03/16/2015)   Baseline MET 03/16/2015 Patient ambulates 125' with RW with SBA   Time 4   Period Weeks   Status Achieved   PT SHORT TERM GOAL #3   Title Patient  negotiates ramps & curbs with rolling walker & prostheses with moderate assist.  (Target Date: 03/16/2015)   Baseline MET 03/16/2015   Time 4   Period Weeks   Status Achieved   PT SHORT TERM GOAL #4   Title Patient reaches 4" beyond length of his arm anteriorly and to ankle level towards ground with RW support with supervision.  (Target Date: 03/16/2015)   Baseline Partially MET Patient requires contact assist for safety to reach 4" anteriorly and to ankle level.    Time 4   Period Weeks   Status Achieved          PT Long Term Goals - 04/13/15 1246    PT LONG TERM GOAL #1   Title Patient verbalizes understanding of ongoing HEP including [employer] to progress strength, flexibility, balance & endurance.  (Target Date: 04/14/2015)   Status Achieved   PT LONG TERM GOAL #2   Title TImed Up & Go with rolling walker <45 seconds modified independent to indicate lower fall risk.  (Target Date: 04/14/2015)   Baseline 04/13/15 Patient scored Normal TUG 51.21 seconds modified independent.   Time 8   Period Weeks   Status Not Met   PT LONG TERM GOAL #3   Title Patient ambulates 4' with supervision with RW & prostheses including negotiating around obstacles for limited community mobility.  (Target Date: 04/14/2015)   Baseline Goal MET 04/13/15 Patient ambulates 125' with supervision with RW & prostheses including negotiated around obstacles.   Time 8   Period Weeks   Status Achieved   PT LONG TERM GOAL #4   Title Patient negotiates ramps & curbs with RW and stairs with 2 rails with minimal assist from family to enable community access.  (Target Date: 04/14/2015)   Baseline 04/13/15 Patient negotiated ramp and curb with minA and has negotiated stairs 2 rails with minA.   Time 8   Period Weeks   Status Achieved   PT LONG TERM GOAL #5   Title Patient reaches 5" all directions and to floor with rolling walker support safely modified independent.  (Target Date: 04/14/2015)   Baseline 04/13/15 Patient reaches at  least 6" all directions and to floor with RW support safely modified independent.   Time 8   Period Weeks   Status Achieved          G-Codes - 04-May-2015 0942    Functional Assessment Tool Used Timed up and go with RW 51.21 sec's mod I assist.          Plan - 04/13/15 1243    Clinical Impression Statement Skilled session addressing LTGs. Patient able to meet all goals as of this session with the exception of 45 second TUG score. Patient reaches at least 6 inches in each direction and to floor with RW support. Patient negotiates ramp and curb with RW with minimal assist. Patient able to ambulate 125 feet with rolling walker with supervision. Patient verbalizes enthusiasm with [employer] exercise program to continue with development of gains made in therapy.   Pt will benefit from skilled therapeutic intervention in order to improve on the following deficits Abnormal gait;Decreased activity tolerance;Decreased balance;Decreased endurance;Decreased knowledge of use of DME;Decreased mobility;Decreased range of motion;Decreased strength;Prosthetic Dependency   Rehab Potential Good   Clinical Impairments Affecting Rehab Potential Dialysis limits strength and endurance with slow gains.    PT Frequency 2x / week   PT Duration 8 weeks   PT Treatment/Interventions ADLs/Self Care Home Management;DME Instruction;Gait training;Functional mobility training;Therapeutic activities;Therapeutic exercise;Balance training;Neuromuscular re-education;Patient/family education;Prosthetic Training;Passive range of motion;Stair training   PT Next Visit Plan Discharge today.   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 Anticipate local [employer] 3 times a week and verbalizes feeling progress there. Stll doing his HEP at home as well.   Consulted and Agree with Plan of Care Patient;Family member/caregiver   Family Member Consulted dtr, Cora      Problem List Patient Active Problem List    Diagnosis Date Noted  . Complications, amputation stump late ([place]) 05/20/2014  . Weakness generalized 04/07/2014  . Sepsis ([place]) 04/07/2014  . Decubitus ulcer, stage II 04/07/2014  . Decubitus ulcer of ankle   . Fatigue   . Wound infection ([place]) 04/06/2014  . Decubitus ulcer of sacral region, stage 2 03/07/2014  . Glaucoma 03/07/2014  . Gout 03/07/2014  . Below knee amputation status ([place]) 02/18/2014  . Osteomyelitis ([place]) 01/04/2014  . Phantom limb ([place]) 12/14/2013  . Type 1 diabetes mellitus with diabetic foot infection ([place])   . Diabetes mellitus with peripheral vascular disease ([place])   . Asthma 05/17/2013  . History of MRSA infection 04/22/2013  . Peripheral vascular disease ([place]) 11/19/2012  . End-stage renal disease on hemodialysis ([place]) 05/05/2012  . Kahler disease ([place]) 03/30/2012  . Anemia in chronic kidney disease 03/19/2012  . Hypothyroidism 03/19/2012  . Anemia, iron deficiency 03/19/2012  . Hypertension 03/18/2012  . Chronic kidney disease (CKD), stage V ([place]) 03/18/2012  . Cardiac conduction disorder 03/18/2012  . MGUS (monoclonal gammopathy of unknown significance) 02/28/2011  . Monoclonal paraproteinemia 02/28/2011    Bayard Beaver, SPTA 04/13/2015, 5:18 PM  [facility] [address] [place], [place], [address] Phone: [phone]   Fax:  [phone]  Name: Jack [name] MRN: [id number] Date of Birth: [birth year]  This note has been reviewed and edited by supervising CI.  Willow Ora, PTA, [facility] [address], [place], [place] [address] [phone] 2015/05/04, 9:44 AM       G-Codes - 05-04-15 0942    Functional Assessment Tool Used Timed up and go with  RW 51.21 sec's mod I assist.   Functional Limitation Mobility: Walking and moving around   Mobility: Walking and Moving Around Goal Status [phone]) At least 60 percent but less than 80 percent  impaired, limited or restricted   Mobility: Walking and Moving Around Discharge Status [phone]) At least 60 percent but less than 80 percent impaired, limited or restricted     PHYSICAL THERAPY DISCHARGE SUMMARY  Visits from Start of Care: 16  Current functional level related to goals / functional outcomes: See above   Remaining deficits: See above   Education / Equipment: HEP & ongoing fitness program Plan: Patient agrees to discharge.  Patient goals were partially met. Patient is being discharged due to meeting the stated rehab goals.  ?????       Jamey Reas, PT, DPT PT Specializing in [place] 04/14/2015 8:01 PM Phone:  [phone]  Fax:  [phone] [facility] [address] [place], [place] [address]

## 2015-04-14 DIAGNOSIS — R269 Unspecified abnormalities of gait and mobility: Secondary | ICD-10-CM | POA: Diagnosis not present

## 2015-07-11 ENCOUNTER — Ambulatory Visit: Payer: Self-pay | Admitting: Family Medicine

## 2015-07-11 ENCOUNTER — Telehealth: Payer: Self-pay | Admitting: Family Medicine

## 2015-07-11 ENCOUNTER — Encounter: Payer: Self-pay | Admitting: Family Medicine

## 2015-07-11 ENCOUNTER — Ambulatory Visit (INDEPENDENT_AMBULATORY_CARE_PROVIDER_SITE_OTHER): Payer: Medicare Other | Admitting: Family Medicine

## 2015-07-11 VITALS — BP 140/80 | HR 69 | Temp 98.1°F | Resp 12 | Ht 70.0 in | Wt 170.0 lb

## 2015-07-11 DIAGNOSIS — R053 Chronic cough: Secondary | ICD-10-CM

## 2015-07-11 DIAGNOSIS — G546 Phantom limb syndrome with pain: Secondary | ICD-10-CM

## 2015-07-11 DIAGNOSIS — N185 Chronic kidney disease, stage 5: Secondary | ICD-10-CM | POA: Diagnosis not present

## 2015-07-11 DIAGNOSIS — I10 Essential (primary) hypertension: Secondary | ICD-10-CM

## 2015-07-11 DIAGNOSIS — E890 Postprocedural hypothyroidism: Secondary | ICD-10-CM | POA: Diagnosis not present

## 2015-07-11 DIAGNOSIS — R05 Cough: Secondary | ICD-10-CM

## 2015-07-11 DIAGNOSIS — E1151 Type 2 diabetes mellitus with diabetic peripheral angiopathy without gangrene: Secondary | ICD-10-CM | POA: Diagnosis not present

## 2015-07-11 LAB — HEPATIC FUNCTION PANEL
ALBUMIN: 3.9 g/dL (ref 3.5–5.2)
ALK PHOS: 73 U/L (ref 39–117)
ALT: 11 U/L (ref 0–53)
AST: 11 U/L (ref 0–37)
BILIRUBIN DIRECT: 0.1 mg/dL (ref 0.0–0.3)
BILIRUBIN TOTAL: 0.4 mg/dL (ref 0.2–1.2)
Total Protein: 7.3 g/dL (ref 6.0–8.3)

## 2015-07-11 LAB — HEMOGLOBIN A1C: HEMOGLOBIN A1C: 9.7 % — AB (ref 4.6–6.5)

## 2015-07-11 LAB — TSH: TSH: 0.13 u[IU]/mL — AB (ref 0.35–4.50)

## 2015-07-11 LAB — T4, FREE: FREE T4: 1.41 ng/dL (ref 0.60–1.60)

## 2015-07-11 MED ORDER — LEVOTHYROXINE SODIUM 150 MCG PO TABS: 150.0000 ug | ORAL_TABLET | Freq: Every day | ORAL | Status: DC

## 2015-07-11 MED ORDER — OXYCODONE-ACETAMINOPHEN 5-325 MG PO TABS: 1.0000 | ORAL_TABLET | Freq: Two times a day (BID) | ORAL | Status: DC | PRN

## 2015-07-11 NOTE — Addendum Note (Signed)
Addended by: [place], BETTY G on: 07/11/2015 05:01 PM   Modules accepted: Orders

## 2015-07-11 NOTE — Patient Instructions (Signed)
A few things to remember from today's visit:   1. Diabetes mellitus with peripheral vascular disease ([place])  - Hemoglobin A1c - Fructosamine - Hepatic Function Panel  2. Other specified hypothyroidism  - T4, free - TSH  3. Chronic kidney disease (CKD), stage V ([place])   4. Phantom limb syndrome with pain ([facility])  - oxyCODONE-acetaminophen (ROXICET) 5-325 MG tablet; Take 1 tablet by mouth every 12 (twelve) hours as needed for moderate pain or severe pain.  Dispense: 40 tablet; Refill: 0    Pain is chronic and the goal with medication is to decrease pain to a level when you are able to function, pain will not be zero. Opioid medications have many side effects: constipation, nausea, vomiting,sedation, decreased psychomotor function, urinary retention, addiction among some. Because opioids are controlled medications refills cannot be given, you may need to pick up a prescription monthly, and undergo urine toxic screenings to verify the presence of medication in urine. Illegal substance are not tolerated when you are taking an opioid medication for pain management. Bring your medications with you for office visits. At some point if I feel like pain is not adequately controlled, I will recommend referral to pain specialists.     We have ordered labs or studies at this visit.  It can take up to 1-2 weeks for results and processing. IF results require follow up or explanation, we will call you with instructions. Clinically stable results will be released to your [facility]. If you have not heard from [place] or cannot find your results in [facility] in 2 weeks please contact our office at [phone].  If you are not yet signed up for [facility], please consider signing up   If you sign-up for My chart, you can communicate easier with [place] in case you have any question or concern.

## 2015-07-11 NOTE — Telephone Encounter (Signed)
Left a message letting patient know that he needs to go to the [place] office to have his x-ray.

## 2015-07-11 NOTE — Progress Notes (Signed)
Pre visit review using our clinic review tool, if applicable. No additional management support is needed unless otherwise documented below in the visit note. 

## 2015-07-11 NOTE — Progress Notes (Addendum)
HPI:   [name] is a 70 y.o. male, who is here today to establish care with me.   Former PCP: Dr Criss Rosales. Last preventive routine visit: a year ago.  Concerns today: Pain on LE after amputation and cough.   -He is complaining of 5 weeks of nonproductive cough, no associated fever or chills. He has not noted chest pain, dyspnea or wheezing. 5 weeks ago he has upper respiratory symptoms, all symptoms resolved except the cough. He is overall stable. No history of heartburn or GERD. Former smoker.  -Status post bilateral lower extremity amputation, he is reporting chronic pain, phanton pain, bilateral. It doesn't happen every day, but sometimes severe, worse at night. He was taking Percocet, which was prescribed by Dr. Sharol Given, he feels like it helps. He takes "a lot of Aleve", which also helps with arthralgias of LUE;according to he was authorized by nephrologist.  Diabetes Mellitus II: Dx about 35 years. He is currently on non pharmacologic treatment.  He  is checking BS's, denies any hypoglycemia: BS's in the 200's.  He denies moderate-severe abdominal pain, nausea, vomiting, polydipsia, polyuria, or polyphagia.  S/P bilateral LE amputation, follows as needed with Dr Sharol Given.  No numbness, tingling, or burning. He denies skin lesions on lower extremities.   Hx of glaucoma, planning on left eye surgery.     Lab Results  Component Value Date   CREATININE 3.80* 05/20/2014   BUN 14 05/20/2014   NA 138 05/20/2014   K 3.5 05/20/2014   CL 98* 05/20/2014   CO2 31 05/20/2014    Lab Results  Component Value Date   HGBA1C 6.9* 02/17/2014   + CKD V: He states that he receives "iron shot" and Hectarol, these meds caused mild abdomen pain. Ca++ causes constipation, so he takes less that recommended. Hemodialysis on MWF. Still produces small amount of urine, once daily when he has a bowel movement.  He sees nephrologists q every Monday. Lab work monthly and he  is on the list for renal transplant but has not had any work-up, states that he is "too old."    Hypothyroidism:  Currently he is on Levothyroxine  175 mcg daily. Tolerating medication well, no side effects reported. He has not noted dysphagia, palpitations, changes in bowel habits, tremor, cold/heat intolerance, or abnormal weight loss.    Lab Results  Component Value Date   TSH 0.917 02/24/2013     S/P radiation for graves disease treatment.   He lives with daughter.. Has prosthetic legs, which he uses most of the time, has a roller walker. He does physical therapy a few times per year.  No falls in the past year and denies depression symptoms.  He exercises regularly, he goes to the gym 3 times per week for an hour. He tries to follow a healthy diet.  He also has history of hypertension, currently he is on non-pharmacologic treatment. His blood pressure is usually on the lower normal range and often he has to take Midodrine to milligrams before having hemodialysis.   Review of Systems  Constitutional: Positive for fatigue. Negative for fever, activity change, appetite change and unexpected weight change.  HENT: Negative for mouth sores, nosebleeds, sore throat and trouble swallowing.   Eyes: Negative for pain, redness and visual disturbance.  Respiratory: Positive for cough (5 weeks afer URI). Negative for apnea, shortness of breath and wheezing.   Cardiovascular: Negative for chest pain, palpitations and leg swelling.  Gastrointestinal: Negative for nausea,  vomiting and abdominal pain.       No changes in bowel habits.  Endocrine: Negative for cold intolerance, heat intolerance, polydipsia and polyphagia.  Genitourinary: Negative for dysuria, hematuria and decreased urine volume.  Musculoskeletal: Positive for arthralgias and gait problem.  Skin: Negative for rash and wound.  Neurological: Negative for dizziness, seizures, syncope, weakness, numbness and headaches.    Psychiatric/Behavioral: Negative for confusion and sleep disturbance. The patient is not nervous/anxious.       Current Outpatient Prescriptions on File Prior to Visit  Medication Sig Dispense Refill  . aspirin EC 81 MG tablet Take 81 mg by mouth daily.    . calcium acetate (PHOSLO) 667 MG capsule Take 1,334 mg by mouth 3 (three) times daily with meals.    . collagenase (SANTYL) ointment Apply 1 application topically daily. Reported on 02/14/2015    . feeding supplement, RESOURCE BREEZE, (RESOURCE BREEZE) LIQD Take 1 Container by mouth 3 (three) times daily between meals.    Marland Kitchen lanthanum (FOSRENOL) 1000 MG chewable tablet Chew 1,000 mg by mouth daily. Reported on 02/14/2015    . levothyroxine (SYNTHROID, LEVOTHROID) 175 MCG tablet Take 175 mcg by mouth daily before breakfast.    . megestrol (MEGACE) 40 MG tablet Take 40 mg by mouth daily.  11  . multivitamin (RENA-VIT) TABS tablet Take 1 tablet by mouth at bedtime.    . predniSONE (DELTASONE) 20 MG tablet Take 2 tablets (40 mg total) by mouth daily. 10 tablet 0  . promethazine (PHENERGAN) 12.5 MG tablet Take 1 tablet (12.5 mg total) by mouth every 6 (six) hours as needed for nausea or vomiting. 30 tablet 0   No current facility-administered medications on file prior to visit.     Past Medical History  Diagnosis Date  . Hypertension   . Pneumonia     2012  . Heart murmur   . Glaucoma   . Multiple myeloma, without mention of having achieved remission 03/30/2012    Cytogenetic neg on 03/23/2012.  . Asthma   . Hyperparathyroidism, secondary renal ([place])   . Peripheral arterial disease ([place])   . End-stage renal disease on hemodialysis [facility])     Started HD March 2014.  Cause of ESRD was DM.  Gets HD at [facility] on [address] on MWF schedule.   . Thyroid disease     hyperparathyroidism  . MRSA bacteremia   . Anemia   . Peripheral vascular disease, unspecified ([place]) 11/19/2012    In the past had R foot toe amps then R TMA. In 2015 had  left foot toe amp > then TMA >then L BKA on 05/14/13   . History of MRSA infection 04/22/2013    Bacteremia assoc w L foot wound infection Mar 2015   . Gangrene of foot ([place])   . ESRD on hemodialysis ([place])   . Diabetes mellitus with peripheral vascular disease ([place])   . Hypothyroidism   . Decubitus ulcer of sacral region, stage 2 03/07/2014  . Glaucoma 03/07/2014  . Gangrene ([place])     right BKA  . Contracture of joint     left knee   Allergies  Allergen Reactions  . Bee Venom Anaphylaxis  . Lisinopril Cough  . Ivp Dye [Iodinated Diagnostic Agents] Hives  . Morphine And Related Hives and Other (See Comments)    Bradycardia states patient    Family History  Problem Relation Age of Onset  . Diabetes Mother   . Cancer Mother     bone   .  Kidney disease Father     Social History   Social History  . Marital Status: Divorced    Spouse Name: N/A  . Number of Children: N/A  . Years of Education: N/A   Social History Main Topics  . Smoking status: Former Smoker    Types: Cigarettes    Quit date: 03/19/1982  . Smokeless tobacco: Never Used  . Alcohol Use: No     Comment: formerly  . Drug Use: No  . Sexual Activity: No   Other Topics Concern  . None   Social History Narrative    Filed Vitals:   07/11/15 1023 07/11/15 1134  BP: 142/84 140/80  Pulse: 69   Temp: 98.1 F (36.7 C)   Resp: 12     Body mass index is 24.39 kg/(m^2).   SpO2 Readings from Last 3 Encounters:  07/11/15 90%  02/20/15 99%  05/21/14 100%      Physical Exam  Constitutional: He is oriented to person, place, and time. He appears well-developed and well-nourished. No distress.  HENT:  Head: Atraumatic.  Mouth/Throat: Oropharynx is clear and moist and mucous membranes are normal.  Eyes: Conjunctivae and EOM are normal. Pupils are unequal (left one bigger and sluggish).  Neck: No tracheal deviation present. No thyroid mass present.  Cardiovascular: Normal rate and regular rhythm.   Murmur  (SEM II/VI RUSB>LUSB) heard. A-V fistula on left arm.  Respiratory: Effort normal and breath sounds normal. No respiratory distress.  GI: Soft. He exhibits no mass. There is no tenderness.  Musculoskeletal: He exhibits no edema.  Left upper extremity with muscle atrophy, more noticeable on hand, thumb adductors, no major deformity. Mild limitation of flexion of IP joints. BKA bilateral.   Lymphadenopathy:    He has no cervical adenopathy.  Neurological: He is alert and oriented to person, place, and time. He has normal strength. No cranial nerve deficit. Coordination normal.  He is in a wheelchair today.  Skin: Skin is warm. No erythema.  Psychiatric: He has a normal mood and affect.  Well groomed, good eye contact.    Lab Results  Component Value Date   HGBA1C 9.7* 07/11/2015   Lab Results  Component Value Date   TSH 0.13* 07/11/2015    Lab Results  Component Value Date   ALT 11 07/11/2015   AST 11 07/11/2015   ALKPHOS 73 07/11/2015   BILITOT 0.4 07/11/2015     ASSESSMENT AND PLAN:    Deloy was seen today for establish care.  Diagnoses and all orders for this visit:    Diabetes mellitus with peripheral vascular disease ([place])  HgA1C pending.  Regular exercise and healthy diet with avoidance of added sugar food intake is an important part of treatment and recommended. At least annual eye exam, periodic dental and stump skin care discussed. Further recommendation will be given according to hemoglobin A1c results. F/U in 5-6 months   -     Hemoglobin A1c -     Fructosamine -     Hepatic Function Panel  Phantom limb syndrome with pain (HCC)   Today I agree with continue feeling his Roxicet, we discussed some side effects. He will take it mainly at night and once during the day if needed. Follow-up in 6 weeks, medication contract to be signed office visit. Fall precautions.  -     oxyCODONE-acetaminophen (ROXICET) 5-325 MG tablet; Take 1 tablet by mouth  every 12 (twelve) hours as needed for moderate pain or severe pain.  Hypothyroidism following radioiodine therapy   No changes in current management, will follow labs done today and will give further recommendations accordingly.  -     T4, free -     TSH  Essential hypertension  Today his blood pressure was slightly elevated, we will continue monitoring. Continue nonpharmacologic treatment. We discussed some side effects of Midodrine.  Persistent cough  We discussed possible causes. Today lung examination negative, no respiratory distress. Because he has been symptomatic for over 3 weeks I recommended chest x-ray. Instructed about warning signs.    -     DG Chest 2 View; Future  Chronic kidney disease (CKD), stage V ([place])  Continue following with nephrologist.          Priscella Donna G. Martinique, MD  [facility]. [facility] office.

## 2015-07-12 ENCOUNTER — Other Ambulatory Visit: Payer: Self-pay | Admitting: Family Medicine

## 2015-07-12 MED ORDER — GLIPIZIDE 5 MG PO TABS: 5.0000 mg | ORAL_TABLET | Freq: Every day | ORAL | Status: DC

## 2015-07-13 LAB — FRUCTOSAMINE: FRUCTOSAMINE: 518 umol/L — AB (ref 190–270)

## 2015-08-22 ENCOUNTER — Ambulatory Visit (INDEPENDENT_AMBULATORY_CARE_PROVIDER_SITE_OTHER): Payer: Medicare Other | Admitting: Family Medicine

## 2015-08-22 ENCOUNTER — Encounter: Payer: Self-pay | Admitting: Family Medicine

## 2015-08-22 VITALS — BP 140/80 | HR 60 | Resp 12 | Ht 70.0 in

## 2015-08-22 DIAGNOSIS — I10 Essential (primary) hypertension: Secondary | ICD-10-CM

## 2015-08-22 DIAGNOSIS — E1151 Type 2 diabetes mellitus with diabetic peripheral angiopathy without gangrene: Secondary | ICD-10-CM | POA: Diagnosis not present

## 2015-08-22 DIAGNOSIS — G546 Phantom limb syndrome with pain: Secondary | ICD-10-CM | POA: Diagnosis not present

## 2015-08-22 DIAGNOSIS — E890 Postprocedural hypothyroidism: Secondary | ICD-10-CM

## 2015-08-22 LAB — TSH: TSH: 0.37 u[IU]/mL (ref 0.35–4.50)

## 2015-08-22 MED ORDER — OXYCODONE-ACETAMINOPHEN 5-325 MG PO TABS: 1.0000 | ORAL_TABLET | Freq: Two times a day (BID) | ORAL | 0 refills | Status: DC | PRN

## 2015-08-22 NOTE — Progress Notes (Signed)
Pre visit review using our clinic review tool, if applicable. No additional management support is needed unless otherwise documented below in the visit note. 

## 2015-08-22 NOTE — Patient Instructions (Signed)
A few things to remember from today's visit:   Hypothyroidism following radioiodine therapy - Plan: TSH  Essential hypertension  Diabetes mellitus with peripheral vascular disease ([place])    Please have a chest x-ray done today or tomorrow. No changes in diabetes medication but if another low blood sugar we need to decrease it to 1/2 tab, avoid skipping meals. Caution with medications due to elevated blood pressure, continue taking it 45 minutes before dialysis and 4 hours after if needed but she also had tablet. No changes in pain medication. No changes in thyroid medication, we may need to adjust dose according to lab results.   Please be sure medication list is accurate. If a new problem present, please set up appointment sooner than planned today.

## 2015-08-22 NOTE — Progress Notes (Signed)
HPI:   Jack [name] is a 70 y.o. male, who is here today to follow on some of his medical problems and chronic pain management.  I saw Jack [name] last on 07/11/2015, when he established care with me.  Last OV he was complaining of 5 weeks of nonproductive cough, no associated fever or chills. He is still having the cough, reporting it as improving. He didn't have CXR done after last OV as planned. He denies wheezing or dyspnea.   Hx of hypothyroidism, last OV after TSH results Levothyroxine was decreased , currently he is on 150 mcg daily. Denies tremor, diarrhea/contipation, cold/heat intolerance.      Hx of CKD V, he has hemodialysis 3 times per week. He usually develops hypotension after procedure, he was recommended by nephrologist to take midodrine 3 times per day, he just takes it 45 minutes before dialysis and has noted blood pressure elevation at 2 200s/140. Hx of HTN on non pharmacologic treatment.   Diabetes Mellitus II:  Glipizide 5 mg started in 06/2915. He is checking BS's, one episode of hypoglycemia 2 weeks ago, 45. Rest of BS's 130's-140's. Otherwise he is tolerating medications well. He denies abdominal pain, nausea, vomiting, polydipsia, polyuria, or polyphagia. No numbness, tingling, or burning.   Lab Results  Component Value Date   HGBA1C 9.7 (H) 07/11/2015    Chronic pain:  Status post bilateral lower extremity amputation, residual phanton pain, bilateral. It doesn't happen every day, but sometimes severe, worse at night. He is taking Oxycodone-Acetaminophen 5-325 mg daily as needed, usually he takes one tab.  Pain is intermittently and when he takes medication pain is almost 0.  Ambulatory with prosthetic legs but most of the time he is on his wheel chair. He is still exercising regularly.    Review of Systems  Constitutional: Positive for fatigue (No more than usual). Negative for activity change, appetite change, fever  and unexpected weight change.  HENT: Negative for mouth sores, nosebleeds, sore throat and trouble swallowing.   Eyes: Negative for pain and visual disturbance.  Respiratory: Positive for cough. Negative for shortness of breath and wheezing.   Cardiovascular: Negative for chest pain, palpitations and leg swelling.  Gastrointestinal: Negative for abdominal pain, nausea and vomiting.       No changes in bowel habits.  Endocrine: Negative for cold intolerance, heat intolerance, polydipsia and polyphagia.  Genitourinary: Negative for decreased urine volume, frequency and hematuria.  Musculoskeletal: Positive for arthralgias and gait problem.  Skin: Negative for rash and wound.  Neurological: Negative for seizures, syncope, weakness and headaches.  Psychiatric/Behavioral: Negative for confusion and sleep disturbance. The patient is not nervous/anxious.       Current Outpatient Prescriptions on File Prior to Visit  Medication Sig Dispense Refill  . aspirin EC 81 MG tablet Take 81 mg by mouth daily.    . calcium acetate (PHOSLO) 667 MG capsule Take 1,334 mg by mouth 3 (three) times daily with meals.    Marland Kitchen glipiZIDE (GLUCOTROL) 5 MG tablet Take 1 tablet (5 mg total) by mouth daily before breakfast. 30 tablet 3  . levothyroxine (SYNTHROID, LEVOTHROID) 150 MCG tablet Take 1 tablet (150 mcg total) by mouth daily. 90 tablet 1  . midodrine (PROAMATINE) 10 MG tablet Take 10 mg by mouth 3 (three) times a week.    . multivitamin (RENA-VIT) TABS tablet Take 1 tablet by mouth at bedtime.     No current facility-administered medications on file prior to visit.  Past Medical History:  Diagnosis Date  . Anemia   . Asthma   . Contracture of joint    left knee  . Decubitus ulcer of sacral region, stage 2 03/07/2014  . Diabetes mellitus with peripheral vascular disease ([place])   . End-stage renal disease on hemodialysis [facility])    Started HD March 2014.  Cause of ESRD was DM.  Gets HD at [facility] on  [address] on MWF schedule.   Marland Kitchen ESRD on hemodialysis ([place])   . Gangrene ([place])    right BKA  . Gangrene of foot ([place])   . Glaucoma   . Glaucoma 03/07/2014  . Heart murmur   . History of MRSA infection 04/22/2013   Bacteremia assoc w L foot wound infection Mar 2015   . Hyperparathyroidism, secondary renal ([place])   . Hypertension   . Hypothyroidism   . MRSA bacteremia   . Multiple myeloma, without mention of having achieved remission 03/30/2012   Cytogenetic neg on 03/23/2012.  Marland Kitchen Peripheral arterial disease ([place])   . Peripheral vascular disease, unspecified ([place]) 11/19/2012   In the past had R foot toe amps then R TMA. In 2015 had left foot toe amp > then TMA >then L BKA on 05/14/13   . Pneumonia    2012  . Thyroid disease    hyperparathyroidism   Allergies  Allergen Reactions  . Bee Venom Anaphylaxis  . Lisinopril Cough  . Ivp Dye [Iodinated Diagnostic Agents] Hives  . Morphine And Related Hives and Other (See Comments)    Bradycardia states patient    Social History   Social History  . Marital status: Divorced    Spouse name: N/A  . Number of children: N/A  . Years of education: N/A   Social History Main Topics  . Smoking status: Former Smoker    Types: Cigarettes    Quit date: 03/19/1982  . Smokeless tobacco: Never Used  . Alcohol use No     Comment: formerly  . Drug use: No  . Sexual activity: No   Other Topics Concern  . None   Social History Narrative  . None    Vitals:   08/22/15 0807  BP: 140/80  Pulse: 60  Resp: 12   There is no height or weight on file to calculate BMI.  O2 sat 97 % at [facility].    Physical Exam  Nursing note and vitals reviewed. Constitutional: He is oriented to person, place, and time. He appears well-developed and well-nourished. No distress.  HENT:  Head: Atraumatic.  Mouth/Throat: Oropharynx is clear and moist and mucous membranes are normal.  Eyes: Conjunctivae and EOM are normal. Pupils are unequal (left one bigger and  sluggish).  Neck: No thyroid mass present.  Cardiovascular: Normal rate and regular rhythm.   Murmur (SEM II/VI RUSB>LUSB) heard. Respiratory: Effort normal and breath sounds normal. No respiratory distress.  GI: Soft. There is no tenderness.  Musculoskeletal: He exhibits no edema.  Shoulder otherwise normal ROM, no pain elicited. BKA bilateral.   Neurological: He is alert and oriented to person, place, and time. He has normal strength. No cranial nerve deficit. Coordination normal.  He is in a wheelchair today.  Skin: Skin is warm. No erythema.  Psychiatric: He has a normal mood and affect.  Well groomed, good eye contact.   Diabetic foot exam: S/P LE amputation. Monofilament on stumps: absent left.     ASSESSMENT AND PLAN:  Lab Results  Component Value Date   TSH 0.37 08/22/2015  Wake was seen today for follow-up.  Diagnoses and all orders for this visit:  Phantom limb syndrome with pain ([place])  Pain well controlled. No changes in current management, some side effects discussed. Med contract signed today. Urine Tox. F/U in 3 months.  -     oxyCODONE-acetaminophen (ROXICET) 5-325 MG tablet; Take 1 tablet by mouth every 12 (twelve) hours as needed for moderate pain or severe pain.  Hypothyroidism following radioiodine therapy  No changes in current management, will follow labs done today and will give further recommendations accordingly.  -     TSH  Essential hypertension  In general stable and well controlled with non pharmacologic treatment. Caution with   Diabetes mellitus with peripheral vascular disease ([place])  Continue monitoring BS's, if any low BS again he needs to let me know and will consider decreasing Glipizide or changing to Trajenta. Some side effects of Glipizide discussed.        -Mr. Bonney Leitz was advised to return sooner than planned today if new concerns arise.       Corley Maffeo G. [place], MD  [facility]. [place] office.

## 2015-08-23 ENCOUNTER — Encounter: Payer: Self-pay | Admitting: Family Medicine

## 2015-08-29 ENCOUNTER — Encounter: Payer: Self-pay | Admitting: Family Medicine

## 2015-08-29 LAB — HM DIABETES EYE EXAM

## 2015-09-12 ENCOUNTER — Other Ambulatory Visit: Payer: Self-pay | Admitting: Family Medicine

## 2015-09-12 MED ORDER — GLIPIZIDE 5 MG PO TABS: 5.0000 mg | ORAL_TABLET | Freq: Every day | ORAL | 0 refills | Status: DC

## 2015-09-12 NOTE — Telephone Encounter (Signed)
Received a fax from [facility] to request a 90 day supply of glipizide. Sent in once for a 90 day supply.  Pt due back in Nov 17.

## 2015-09-13 ENCOUNTER — Encounter: Payer: Self-pay | Admitting: Family Medicine

## 2015-09-13 LAB — HM DIABETES EYE EXAM

## 2015-10-02 ENCOUNTER — Encounter: Payer: Self-pay | Admitting: Family Medicine

## 2015-10-02 DIAGNOSIS — E11319 Type 2 diabetes mellitus with unspecified diabetic retinopathy without macular edema: Secondary | ICD-10-CM | POA: Insufficient documentation

## 2015-10-24 ENCOUNTER — Telehealth: Payer: Self-pay | Admitting: Family Medicine

## 2015-10-24 DIAGNOSIS — G546 Phantom limb syndrome with pain: Secondary | ICD-10-CM

## 2015-10-24 NOTE — Telephone Encounter (Signed)
It seems like Rx I gave him on his last OV, 08/22/15 was either filled out under another provider's name or he had other Rx. Please call to pharmacy to clarify before printing Rx.  Percocet 5-325 mg to continue 1 tab bid as needed # 40/0  Thanks, BJ

## 2015-10-24 NOTE — Telephone Encounter (Signed)
Pt need new Rx for Oxycodone

## 2015-10-24 NOTE — Telephone Encounter (Signed)
Okay to refill? 

## 2015-10-25 NOTE — Telephone Encounter (Signed)
I called and spoke with Dr. Lynder Parents office and they do not see anywhere where it was written. The pharmacy printed the hard copy and Dr. Lynder Parents name is on the prescription. The patient did have eye surgery.

## 2015-10-26 MED ORDER — OXYCODONE-ACETAMINOPHEN 5-325 MG PO TABS: 1.0000 | ORAL_TABLET | Freq: Two times a day (BID) | ORAL | 0 refills | Status: DC | PRN

## 2015-10-26 NOTE — Telephone Encounter (Signed)
Patient aware Rx is ready for pick up. Rx placed up front.

## 2015-10-26 NOTE — Telephone Encounter (Signed)
Rx printed for MD signature

## 2015-10-26 NOTE — Telephone Encounter (Signed)
This time Rx can be given but please explain Jack [name] he is not supposed to accept or request controlled medications from another providers. Thanks, BJ

## 2015-11-21 ENCOUNTER — Ambulatory Visit (INDEPENDENT_AMBULATORY_CARE_PROVIDER_SITE_OTHER): Payer: Medicare Other | Admitting: Family Medicine

## 2015-11-21 ENCOUNTER — Encounter: Payer: Self-pay | Admitting: Family Medicine

## 2015-11-21 ENCOUNTER — Ambulatory Visit: Payer: Medicare Other | Admitting: Family Medicine

## 2015-11-21 VITALS — BP 132/70 | HR 73 | Temp 98.5°F | Resp 12 | Ht 70.0 in

## 2015-11-21 DIAGNOSIS — E1151 Type 2 diabetes mellitus with diabetic peripheral angiopathy without gangrene: Secondary | ICD-10-CM | POA: Diagnosis not present

## 2015-11-21 DIAGNOSIS — Z23 Encounter for immunization: Secondary | ICD-10-CM | POA: Diagnosis not present

## 2015-11-21 DIAGNOSIS — E890 Postprocedural hypothyroidism: Secondary | ICD-10-CM | POA: Diagnosis not present

## 2015-11-21 DIAGNOSIS — G546 Phantom limb syndrome with pain: Secondary | ICD-10-CM | POA: Diagnosis not present

## 2015-11-21 DIAGNOSIS — E785 Hyperlipidemia, unspecified: Secondary | ICD-10-CM | POA: Diagnosis not present

## 2015-11-21 LAB — LIPID PANEL
CHOLESTEROL: 185 mg/dL (ref 0–200)
HDL: 45.6 mg/dL (ref >39.00)
LDL Cholesterol: 116 mg/dL — ABNORMAL HIGH (ref 0–99)
NONHDL: 139.05
Total CHOL/HDL Ratio: 4
Triglycerides: 114 mg/dL (ref 0.0–149.0)
VLDL: 22.8 mg/dL (ref 0.0–40.0)

## 2015-11-21 LAB — HEMOGLOBIN A1C: Hgb A1c MFr Bld: 7 % — ABNORMAL HIGH (ref 4.6–6.5)

## 2015-11-21 LAB — TSH: TSH: 0.42 u[IU]/mL (ref 0.35–4.50)

## 2015-11-21 MED ORDER — OXYCODONE-ACETAMINOPHEN 5-325 MG PO TABS: 1.0000 | ORAL_TABLET | Freq: Two times a day (BID) | ORAL | 0 refills | Status: DC | PRN

## 2015-11-21 NOTE — Patient Instructions (Signed)
A few things to remember from today's visit:   Diabetes mellitus with peripheral vascular disease ([place]) - Plan: Hemoglobin A1c, Fructosamine, Lipid panel  Essential hypertension - Plan: TSH  Phantom limb (HCC)  Phantom limb syndrome with pain (HCC) - Plan: oxyCODONE-acetaminophen (ROXICET) 5-325 MG tablet  Dyslipidemia - Plan: Lipid panel  Hypothyroidism following radioiodine therapy  No changes for now.  Will make adjustments based on lab results.   Please be sure medication list is accurate. If a new problem present, please set up appointment sooner than planned today.

## 2015-11-21 NOTE — Progress Notes (Signed)
HPI:   [name] is a 70 y.o. male, who is here today with his daughter to follow on some of his chronic medical problems.    Concerns today: skin lesion on left forearm, growing, frequent trauma and easy bleeding.    Hypothyroidism:  Currently he is on Levothyroxine 150 g daily. Tolerating medication well, no side effects reported. He has not noted dysphagia, palpitations, changes in bowel habits, tremor, cold/heat intolerance, or abnormal weight loss. + fatigue, chronic and stable.  Lab Results  Component Value Date   TSH 0.37 08/22/2015   "Feeling great"  Yesterday report from nephrologists, phosphorus 6.2, going up some. He tells me that is is hard to take Calcium Acetate because he is supposed to take 4 tabs with meals and snacks; trying to do better.    Diabetes Mellitus II:    Currently on Glipizide 5 mg daily. Problem has been stable. Checking BS's : Yes. Hypoglycemia:really low if he does not eat, so he eating small snack at bedtime,  Has been 90's. Max BS's 210 postprandial.  According to daughter, he loves to eat cakes.  In general he is tolerating medications well. He denies unusual abdominal pain, nausea, vomiting, polydipsia, polyuria, or polyphagia.  + Dx with retinopathy a few months ago. He had left eye surgery, vision seems to be worse. Right eye laser surgery for milder disease,according to pt, for "prevetive measure"     Lab Results  Component Value Date   CREATININE 3.80 (H) 05/20/2014   BUN 14 05/20/2014   NA 138 05/20/2014   K 3.5 05/20/2014   CL 98 (L) 05/20/2014   CO2 31 05/20/2014    Lab Results  Component Value Date   HGBA1C 9.7 (H) 07/11/2015     HLD: He is not on statin medication.  Lab Results  Component Value Date   CHOL 141 04/07/2014   HDL 23 (L) 04/07/2014   LDLCALC 98 04/07/2014   TRIG 100 04/07/2014   CHOLHDL 6.1 04/07/2014    Chronic pain:  Phantom pain, s/p bilateral leg  amputation dur to PAD. He is currently on Percocet 5-325 mg 1-2 times daily prn for pain and still helping. He keeps medications in a safe place. No side effects from med. Pain from 10/10 with no medication to 4/10 with medication.    Lab Results  Component Value Date   CHOL 141 04/07/2014   HDL 23 (L) 04/07/2014   LDLCALC 98 04/07/2014   TRIG 100 04/07/2014   CHOLHDL 6.1 04/07/2014    He never had CXR recommended for persistent cough, reporting resolution of symptoms.     Review of Systems  Constitutional: Positive for fatigue (No more than usual). Negative for activity change, appetite change, fever and unexpected weight change.  HENT: Negative for dental problem, mouth sores, nosebleeds, sore throat and trouble swallowing.   Eyes: Positive for visual disturbance. Negative for pain and redness.  Respiratory: Negative for cough, shortness of breath and wheezing.   Cardiovascular: Negative for chest pain, palpitations and leg swelling.  Gastrointestinal: Negative for abdominal pain, nausea and vomiting.       No changes in bowel habits.  Endocrine: Negative for cold intolerance, heat intolerance, polydipsia and polyphagia.  Genitourinary: Negative for decreased urine volume, frequency and hematuria.  Musculoskeletal: Positive for arthralgias and gait problem.  Skin: Negative for rash and wound.  Neurological: Negative for syncope, weakness and headaches.  Psychiatric/Behavioral: Negative for confusion and sleep disturbance. The patient is  not nervous/anxious.       Current Outpatient Prescriptions on File Prior to Visit  Medication Sig Dispense Refill  . aspirin EC 81 MG tablet Take 81 mg by mouth daily.    . calcium acetate (PHOSLO) 667 MG capsule Take 1,334 mg by mouth 3 (three) times daily with meals.    Marland Kitchen glipiZIDE (GLUCOTROL) 5 MG tablet Take 1 tablet (5 mg total) by mouth daily before breakfast. 90 tablet 0  . levothyroxine (SYNTHROID, LEVOTHROID) 150 MCG tablet  Take 1 tablet (150 mcg total) by mouth daily. 90 tablet 1  . midodrine (PROAMATINE) 10 MG tablet Take 10 mg by mouth 3 (three) times a week.    . multivitamin (RENA-VIT) TABS tablet Take 1 tablet by mouth at bedtime.     No current facility-administered medications on file prior to visit.      Past Medical History:  Diagnosis Date  . Anemia   . Asthma   . Contracture of joint    left knee  . Decubitus ulcer of sacral region, stage 2 03/07/2014  . Diabetes mellitus with peripheral vascular disease ([place])   . End-stage renal disease on hemodialysis [facility])    Started HD March 2014.  Cause of ESRD was DM.  Gets HD at [facility] on [place] on MWF schedule.   Marland Kitchen ESRD on hemodialysis ([place])   . Gangrene ([place])    right BKA  . Gangrene of foot ([place])   . Glaucoma   . Glaucoma 03/07/2014  . Heart murmur   . History of MRSA infection 04/22/2013   Bacteremia assoc w L foot wound infection Mar 2015   . Hyperparathyroidism, secondary renal ([place])   . Hypertension   . Hypothyroidism   . MRSA bacteremia   . Multiple myeloma, without mention of having achieved remission 03/30/2012   Cytogenetic neg on 03/23/2012.  Marland Kitchen Peripheral arterial disease ([place])   . Peripheral vascular disease, unspecified 11/19/2012   In the past had R foot toe amps then R TMA. In 2015 had left foot toe amp > then TMA >then L BKA on 05/14/13   . Pneumonia    2012  . Thyroid disease    hyperparathyroidism   Allergies  Allergen Reactions  . Bee Venom Anaphylaxis  . Lisinopril Cough  . Ivp Dye [Iodinated Diagnostic Agents] Hives  . Morphine And Related Hives and Other (See Comments)    Bradycardia states patient    Social History   Social History  . Marital status: Divorced    Spouse name: N/A  . Number of children: N/A  . Years of education: N/A   Social History Main Topics  . Smoking status: Former Smoker    Types: Cigarettes    Quit date: 03/19/1982  . Smokeless tobacco: Never Used  . Alcohol use No     Comment:  formerly  . Drug use: No  . Sexual activity: No   Other Topics Concern  . None   Social History Narrative  . None    Vitals:   11/21/15 0839  BP: 132/70  Pulse: 73  Resp: 12  Temp: 98.5 F (36.9 C)   There is no height or weight on file to calculate BMI.    Physical Exam  Nursing note and vitals reviewed. Constitutional: He is oriented to person, place, and time. He appears well-developed and well-nourished. No distress.  HENT:  Head: Atraumatic.  Mouth/Throat: Oropharynx is clear and moist and mucous membranes are normal.  Eyes: Conjunctivae and EOM are  normal. Left pupil is not reactive. Pupils are unequal.  Neck: No tracheal deviation present. No thyroid mass and no thyromegaly present.  Cardiovascular: Normal rate and regular rhythm.   Occasional extrasystoles are present.  Murmur (SEM II/VI RUSB>LUSB) heard. A-V fistula on left arm.  Respiratory: Effort normal and breath sounds normal. No respiratory distress.  GI: Soft. He exhibits no mass. There is no tenderness.  Musculoskeletal: He exhibits no edema.  BKA bilateral.   Neurological: He is alert and oriented to person, place, and time. He has normal strength. Coordination normal.  He is in a wheelchair.  Skin: Skin is warm. No erythema.  Psychiatric: He has a normal mood and affect.  Well groomed, good eye contact.      ASSESSMENT AND PLAN:     Saabir was seen today for follow-up.  Diagnoses and all orders for this visit:     Diabetes mellitus with peripheral vascular disease ([place])  HgA1C pending, goal < 8.0 No changes in current management, I am considering decreasing dose of Glipizide depending of the lab results. Regular exercise and healthy diet with avoidance of added sugar food intake is an important part of treatment and recommended. Continue eye exam as recommended by ophthalmologists, periodic dental and good skin care. F/U in 5-6 months  -     Hemoglobin A1c -     Fructosamine -      Lipid panel  Phantom limb syndrome with pain (HCC)  Adequately controlled. Some side effects of Percocet discussed. No changes in current management. Follow-up in 3-4 months.  -     oxyCODONE-acetaminophen (ROXICET) 5-325 MG tablet; Take 1 tablet by mouth every 12 (twelve) hours as needed for moderate pain or severe pain.  Hypothyroidism following radioiodine therapy  No changes in current management, will follow labs done today and will give further recommendations accordingly.   -     TSH  Dyslipidemia  Low fat diet to continue. Further recommendations will be given according to lab results.   -     Lipid panel    Need for immunization against influenza -     Flu vaccine HIGH DOSE PF      -[name] was advised to return sooner than planned today if new concerns arise.       Betty G. [place], MD  [facility]. [place] office.

## 2015-11-23 LAB — FRUCTOSAMINE: FRUCTOSAMINE: 399 umol/L — AB (ref 190–270)

## 2016-01-02 ENCOUNTER — Other Ambulatory Visit: Payer: Self-pay | Admitting: Family Medicine

## 2016-01-02 DIAGNOSIS — E890 Postprocedural hypothyroidism: Secondary | ICD-10-CM

## 2016-01-12 ENCOUNTER — Other Ambulatory Visit: Payer: Self-pay | Admitting: Vascular Surgery

## 2016-01-12 DIAGNOSIS — T82858S Stenosis of vascular prosthetic devices, implants and grafts, sequela: Secondary | ICD-10-CM

## 2016-01-18 ENCOUNTER — Encounter: Payer: Self-pay | Admitting: Vascular Surgery

## 2016-01-18 ENCOUNTER — Other Ambulatory Visit: Payer: Self-pay | Admitting: Vascular Surgery

## 2016-01-18 ENCOUNTER — Other Ambulatory Visit (HOSPITAL_COMMUNITY): Payer: Self-pay | Admitting: Vascular Surgery

## 2016-01-18 ENCOUNTER — Other Ambulatory Visit: Payer: Self-pay

## 2016-01-18 ENCOUNTER — Ambulatory Visit (INDEPENDENT_AMBULATORY_CARE_PROVIDER_SITE_OTHER): Payer: Medicare Other | Admitting: Vascular Surgery

## 2016-01-18 ENCOUNTER — Ambulatory Visit (HOSPITAL_COMMUNITY)
Admission: RE | Admit: 2016-01-18 | Discharge: 2016-01-18 | Disposition: A | Discharge status: Home / Self Care | Payer: Medicare Other | Source: Ambulatory Visit | Attending: Vascular Surgery | Admitting: Vascular Surgery

## 2016-01-18 VITALS — BP 156/88 | HR 91 | Temp 99.3°F | Resp 16 | Ht 70.0 in | Wt 170.0 lb

## 2016-01-18 DIAGNOSIS — I770 Arteriovenous fistula, acquired: Secondary | ICD-10-CM | POA: Diagnosis present

## 2016-01-18 DIAGNOSIS — T82858S Stenosis of vascular prosthetic devices, implants and grafts, sequela: Secondary | ICD-10-CM

## 2016-01-18 DIAGNOSIS — N186 End stage renal disease: Secondary | ICD-10-CM

## 2016-01-18 DIAGNOSIS — Z992 Dependence on renal dialysis: Secondary | ICD-10-CM

## 2016-01-18 NOTE — Progress Notes (Signed)
Referring Physician: Otelia Santee, MD  Patient name: Jack [name] MRN: [id number] DOB: 12-17-1945 Sex: male  REASON FOR CONSULT: Possible revision left arm AV fistula  HPI: Bertram Haddix is a 71 y.o. male,  who currently has a functioning left brachiocephalic AV fistula. This was originally placed in April 2014. He was noted on recent fistulogram to have narrowing of the cephalic junction to the subclavian vein.He states that currently the fistula has been working okay. Other medical problems include asthma, diabetes, end-stage renal disease Monday Wednesday Friday dialysis, peripheral arterial disease with prior right below-knee amputation, hypertension all of which are currently stable.  Past Medical History:  Diagnosis Date  . Anemia   . Asthma   . Contracture of joint    left knee  . Decubitus ulcer of sacral region, stage 2 03/07/2014  . Diabetes mellitus with peripheral vascular disease ([place])   . End-stage renal disease on hemodialysis [facility])    Started HD March 2014.  Cause of ESRD was DM.  Gets HD at [facility] on [place] on MWF schedule.   Marland Kitchen ESRD on hemodialysis ([place])   . Gangrene ([place])    right BKA  . Gangrene of foot ([place])   . Glaucoma   . Glaucoma 03/07/2014  . Heart murmur   . History of MRSA infection 04/22/2013   Bacteremia assoc w L foot wound infection Mar 2015   . Hyperparathyroidism, secondary renal ([place])   . Hypertension   . Hypothyroidism   . MRSA bacteremia   . Multiple myeloma, without mention of having achieved remission 03/30/2012   Cytogenetic neg on 03/23/2012.  Marland Kitchen Peripheral arterial disease ([place])   . Peripheral vascular disease, unspecified 11/19/2012   In the past had R foot toe amps then R TMA. In 2015 had left foot toe amp > then TMA >then L BKA on 05/14/13   . Pneumonia    2012  . Thyroid disease    hyperparathyroidism   Past Surgical History:  Procedure Laterality Date  . ABDOMINAL AORTAGRAM Bilateral 11/06/2012   Procedure:  ABDOMINAL AORTAGRAM;  Surgeon: Elam Dutch, MD;  Location: [facility] CATH LAB;  Service: Cardiovascular;  Laterality: Bilateral;  . AMPUTATION Right 11/10/2012   Procedure: AMPUTATION FIRST and SECOND TOES Right Foot;  Surgeon: Elam Dutch, MD;  Location: [place];  Service: Vascular;  Laterality: Right;  . AMPUTATION Left 04/07/2013   Procedure: AMPUTATION DIGIT- LEFT 1ST TOE;  Surgeon: Mal Misty, MD;  Location: [place];  Service: Vascular;  Laterality: Left;  . AMPUTATION Left 04/26/2013   Procedure: Left Foot Transmetatarsal Amputation;  Surgeon: Newt Minion, MD;  Location: [place];  Service: Orthopedics;  Laterality: Left;  . AMPUTATION Left 05/14/2013   Procedure: AMPUTATION BELOW KNEE;  Surgeon: Newt Minion, MD;  Location: [place];  Service: Orthopedics;  Laterality: Left;  Left Below Knee Amputation  . AMPUTATION Left 07/23/2013   Procedure: AMPUTATION BELOW KNEE;  Surgeon: Newt Minion, MD;  Location: [place];  Service: Orthopedics;  Laterality: Left;  Left Below Knee Amputation Revision  . AMPUTATION Right 02/18/2014   Procedure: AMPUTATION BELOW KNEE;  Surgeon: Newt Minion, MD;  Location: [place];  Service: Orthopedics;  Laterality: Right;  . AV FISTULA PLACEMENT Left 03/25/2012   Procedure: ARTERIOVENOUS (AV) FISTULA CREATION;  Surgeon: Mal Misty, MD;  Location: [place];  Service: Vascular;  Laterality: Left;  . BELOW KNEE LEG AMPUTATION Left 05/14/2013   DR [name]  .  BELOW KNEE LEG AMPUTATION Right 02/18/2014   dr duda  . CARDIAC CATHETERIZATION     approx 30 years ago  . CERVICAL DISC SURGERY    . EYE SURGERY Bilateral    CATARACTS  . I&D EXTREMITY Left 04/22/2013   Procedure: IRRIGATION AND DEBRIDEMENT LEFT FIRST TOE AMPUTATION WOUND ;  Surgeon: Mal Misty, MD;  Location: [place];  Service: Vascular;  Laterality: Left;  . I&D EXTREMITY Right 01/05/2014   Procedure: IRRIGATION AND DEBRIDEMENT Right Heel Ulcer;  Surgeon: Mcarthur Rossetti, MD;  Location: [place];  Service:  Orthopedics;  Laterality: Right;  Surgeon Available after 5PM  . INSERTION OF DIALYSIS CATHETER Right 03/19/2012   Procedure: INSERTION OF DIALYSIS CATHETER;  Surgeon: Mal Misty, MD;  Location: [place];  Service: Vascular;  Laterality: Right;  Right Internal Jugular  . LIGATION OF COMPETING BRANCHES OF ARTERIOVENOUS FISTULA Left 05/08/2012   Procedure: LIGATION OF COMPETING BRANCHES OF ARTERIOVENOUS FISTULA;  Surgeon: Mal Misty, MD;  Location: [place];  Service: Vascular;  Laterality: Left;  Ultrasound guided  . REPAIR QUADRICEPS/HAMSTRING MUSCLES Left 05/20/2014   Procedure: Left Hamstring Release;  Surgeon: Newt Minion, MD;  Location: [place];  Service: Orthopedics;  Laterality: Left;  . STUMP REVISION Right 05/20/2014   Procedure: Revision Right Below Knee Amputation;  Surgeon: Newt Minion, MD;  Location: [place];  Service: Orthopedics;  Laterality: Right;  . TEE WITHOUT CARDIOVERSION N/A 04/20/2013   Procedure: TRANSESOPHAGEAL ECHOCARDIOGRAM (TEE);  Surgeon: Josue Hector, MD;  Location: [facility] ENDOSCOPY;  Service: Cardiovascular;  Laterality: N/A;  . THYROIDECTOMY    . TOE AMPUTATION     D/C 04-30-13  . TRANSMETATARSAL AMPUTATION Left 12/16/2012   Procedure: TRANSMETATARSAL AMPUTATION AND VAC PLACEMENT;  Surgeon: Elam Dutch, MD;  Location: [facility];  Service: Vascular;  Laterality: Left;    Family History  Problem Relation Age of Onset  . Diabetes Mother   . Cancer Mother     bone   . Kidney disease Father     SOCIAL HISTORY: Social History   Social History  . Marital status: Divorced    Spouse name: N/A  . Number of children: N/A  . Years of education: N/A   Occupational History  . Not on file.   Social History Main Topics  . Smoking status: Former Smoker    Types: Cigarettes    Quit date: 03/19/1982  . Smokeless tobacco: Never Used  . Alcohol use No     Comment: formerly  . Drug use: No  . Sexual activity: No   Other Topics Concern  . Not on file   Social History  Narrative  . No narrative on file    Allergies  Allergen Reactions  . Bee Venom Anaphylaxis  . Lisinopril Cough  . Ivp Dye [Iodinated Diagnostic Agents] Hives  . Morphine And Related Hives and Other (See Comments)    Bradycardia states patient    Current Outpatient Prescriptions  Medication Sig Dispense Refill  . aspirin EC 81 MG tablet Take 81 mg by mouth daily.    . calcium acetate (PHOSLO) 667 MG capsule Take 1,334 mg by mouth 3 (three) times daily with meals.    Marland Kitchen glipiZIDE (GLUCOTROL) 5 MG tablet TAKE 1 TABLET EVERY DAY BEFORE BREAKFAST 90 tablet 1  . levothyroxine (SYNTHROID, LEVOTHROID) 150 MCG tablet TAKE 1 TABLET (150 MCG TOTAL) BY MOUTH DAILY. 90 tablet 1  . midodrine (PROAMATINE) 10 MG tablet Take 10 mg by mouth  3 (three) times a week.    . multivitamin (RENA-VIT) TABS tablet Take 1 tablet by mouth at bedtime.    Marland Kitchen oxyCODONE-acetaminophen (ROXICET) 5-325 MG tablet Take 1 tablet by mouth every 12 (twelve) hours as needed for moderate pain or severe pain. 40 tablet 0   No current facility-administered medications for this visit.     ROS:   General:  No weight loss, Fever, chills  HEENT: No recent headaches, no nasal bleeding, no visual changes, no sore throat  Neurologic: No dizziness, blackouts, seizures. No recent symptoms of stroke or mini- stroke. No recent episodes of slurred speech, or temporary blindness.  Cardiac: No recent episodes of chest pain/pressure, no shortness of breath at rest.  No shortness of breath with exertion.  Denies history of atrial fibrillation or irregular heartbeat  Vascular: No history of rest pain in feet.  No history of claudication.  No history of non-healing ulcer, No history of DVT   Pulmonary: No home oxygen, no productive cough, no hemoptysis,  No asthma or wheezing  Musculoskeletal:  _0  Arthritis, _1  Low back pain,  _2  Joint pain  Hematologic:No history of hypercoagulable state.  No history of easy bleeding.  No history of  anemia  Gastrointestinal: No hematochezia or melena,  No gastroesophageal reflux, no trouble swallowing  Urinary: _3  chronic Kidney disease, _4  on HD - _5  MWF or _6  TTHS, _7  Burning with urination, _8  Frequent urination, _9  Difficulty urinating;   Skin: No rashes  Psychological: No history of anxiety,  No history of depression   Physical Examination  Vitals:   01/18/16 1413  BP: (!) 156/88  Pulse: 91  Resp: 16  Temp: 99.3 F (37.4 C)  TempSrc: Oral  SpO2: 100%  Weight: 170 lb (77.1 kg)  Height: _10  (1.778 m)    Body mass index is 24.39 kg/m.  General:  Alert and oriented, no acute distress HEENT: Normal Neck: No bruit or JVD Pulmonary: Clear to auscultation bilaterally Cardiac: Regular Rate and Rhythm without murmur Skin: No rash Extremity Pulses:  Palpable thrill left upper arm AV fistula, mild aneurysmal degeneration midportion Musculoskeletal: No deformity or edema  Neurologic: Upper and lower extremity motor 5/5 and symmetric  DATA:  Patient had a duplex ultrasound of his AV fistula today. This showed good flow throughout the fistula with diameter 7-9 mm no obvious narrowing  I also reviewed Dr. Aron Baba recent fistulogram which showed a diffuse narrowed segment over about 3 cm at the cephalic subclavian junction. This responded well to angioplasty.  ASSESSMENT:  Patient at risk for recurrent narrowing cephalic subclavian junction for left arm AV fistula outflow.   PLAN:  Left cephalic vein turndown scheduled for 01/30/2016. Risks benefits possible, patient and procedure details were discussed the patient today including but not limited to bleeding infection fistula thrombosis need for additional procedures. He understands and agrees to proceed.   [name], MD [facility] Office: [phone] Pager: [phone]

## 2016-01-29 ENCOUNTER — Encounter (HOSPITAL_COMMUNITY): Payer: Self-pay | Admitting: *Deleted

## 2016-01-29 NOTE — Progress Notes (Signed)
Pt denies SOB, chest pain, and being under the care of a cardiologist. Pt stated that he last had a stress test 10 years ago and a cardiac cath > 25 years ago by Dr. Melvern Banker. Pt denies having a chest x ray and EKG within the last year. Pt made aware to stop taking otc vitamins, fish oil and herbal medications. Do not take any NSAIDs ie: Ibuprofen, Advil, Naproxen   (Anaprox). Pt stated that his fasting blood glucose is generally in the range of 140's. Pt made aware to not take Glipizide diabetes pill the morning of procedure, check blood glucose (BG) every 2 hours prior to arrival, intervention for BG <70 (drink 4 oz. of Cranberry Juice and recheck BG after 15 minutes of drinking juice). Pt verbalized understanding of all pre-op instructions.

## 2016-01-30 ENCOUNTER — Ambulatory Visit (HOSPITAL_COMMUNITY)
Admission: RE | Admit: 2016-01-30 | Discharge: 2016-01-30 | Disposition: A | Discharge status: Home / Self Care | Payer: Medicare Other | Source: Ambulatory Visit | Attending: Vascular Surgery | Admitting: Vascular Surgery

## 2016-01-30 ENCOUNTER — Encounter (HOSPITAL_COMMUNITY)
Admission: RE | Disposition: A | Discharge status: Home / Self Care | Payer: Self-pay | Source: Ambulatory Visit | Attending: Vascular Surgery

## 2016-01-30 ENCOUNTER — Encounter (HOSPITAL_COMMUNITY): Payer: Self-pay | Admitting: *Deleted

## 2016-01-30 ENCOUNTER — Telehealth: Payer: Self-pay | Admitting: Vascular Surgery

## 2016-01-30 ENCOUNTER — Ambulatory Visit (HOSPITAL_COMMUNITY): Payer: Medicare Other | Admitting: Certified Registered"

## 2016-01-30 DIAGNOSIS — T82898A Other specified complication of vascular prosthetic devices, implants and grafts, initial encounter: Secondary | ICD-10-CM | POA: Diagnosis not present

## 2016-01-30 DIAGNOSIS — Z89512 Acquired absence of left leg below knee: Secondary | ICD-10-CM | POA: Insufficient documentation

## 2016-01-30 DIAGNOSIS — Z79899 Other long term (current) drug therapy: Secondary | ICD-10-CM | POA: Insufficient documentation

## 2016-01-30 DIAGNOSIS — E1122 Type 2 diabetes mellitus with diabetic chronic kidney disease: Secondary | ICD-10-CM | POA: Insufficient documentation

## 2016-01-30 DIAGNOSIS — N186 End stage renal disease: Secondary | ICD-10-CM | POA: Diagnosis not present

## 2016-01-30 DIAGNOSIS — Z7982 Long term (current) use of aspirin: Secondary | ICD-10-CM | POA: Diagnosis not present

## 2016-01-30 DIAGNOSIS — T82590A Other mechanical complication of surgically created arteriovenous fistula, initial encounter: Secondary | ICD-10-CM | POA: Diagnosis present

## 2016-01-30 DIAGNOSIS — I120 Hypertensive chronic kidney disease with stage 5 chronic kidney disease or end stage renal disease: Secondary | ICD-10-CM | POA: Insufficient documentation

## 2016-01-30 DIAGNOSIS — Y832 Surgical operation with anastomosis, bypass or graft as the cause of abnormal reaction of the patient, or of later complication, without mention of misadventure at the time of the procedure: Secondary | ICD-10-CM | POA: Insufficient documentation

## 2016-01-30 DIAGNOSIS — E1151 Type 2 diabetes mellitus with diabetic peripheral angiopathy without gangrene: Secondary | ICD-10-CM | POA: Insufficient documentation

## 2016-01-30 DIAGNOSIS — Z992 Dependence on renal dialysis: Secondary | ICD-10-CM | POA: Insufficient documentation

## 2016-01-30 DIAGNOSIS — Z89511 Acquired absence of right leg below knee: Secondary | ICD-10-CM | POA: Diagnosis not present

## 2016-01-30 DIAGNOSIS — G546 Phantom limb syndrome with pain: Secondary | ICD-10-CM

## 2016-01-30 DIAGNOSIS — N2581 Secondary hyperparathyroidism of renal origin: Secondary | ICD-10-CM | POA: Insufficient documentation

## 2016-01-30 DIAGNOSIS — Z87891 Personal history of nicotine dependence: Secondary | ICD-10-CM | POA: Insufficient documentation

## 2016-01-30 DIAGNOSIS — E039 Hypothyroidism, unspecified: Secondary | ICD-10-CM | POA: Diagnosis not present

## 2016-01-30 HISTORY — PX: REVISON OF ARTERIOVENOUS FISTULA: SHX6074

## 2016-01-30 LAB — POCT I-STAT 4, (NA,K, GLUC, HGB,HCT)
GLUCOSE: 86 mg/dL (ref 65–99)
HEMATOCRIT: 32 % — AB (ref 39.0–52.0)
Hemoglobin: 10.9 g/dL — ABNORMAL LOW (ref 13.0–17.0)
Potassium: 3.9 mmol/L (ref 3.5–5.1)
Sodium: 142 mmol/L (ref 135–145)

## 2016-01-30 LAB — GLUCOSE, CAPILLARY: Glucose-Capillary: 108 mg/dL — ABNORMAL HIGH (ref 65–99)

## 2016-01-30 SURGERY — REVISON OF ARTERIOVENOUS FISTULA
Anesthesia: General | Site: Arm Upper | Laterality: Left

## 2016-01-30 MED ORDER — PHENYLEPHRINE HCL 10 MG/ML IJ SOLN
INTRAVENOUS | Status: DC | PRN
  Administered 2016-01-30: 40 ug/min via INTRAVENOUS

## 2016-01-30 MED ORDER — 0.9 % SODIUM CHLORIDE (POUR BTL) OPTIME
TOPICAL | Status: DC | PRN
  Administered 2016-01-30: 1000 mL

## 2016-01-30 MED ORDER — FENTANYL CITRATE (PF) 100 MCG/2ML IJ SOLN
INTRAMUSCULAR | Status: AC
  Filled 2016-01-30: qty 2

## 2016-01-30 MED ORDER — FENTANYL CITRATE (PF) 100 MCG/2ML IJ SOLN
25.0000 ug | INTRAMUSCULAR | Status: DC | PRN
  Administered 2016-01-30 (×2): 25 ug via INTRAVENOUS

## 2016-01-30 MED ORDER — OXYCODONE-ACETAMINOPHEN 5-325 MG PO TABS: 1.0000 | ORAL_TABLET | Freq: Two times a day (BID) | ORAL | 0 refills | Status: DC | PRN

## 2016-01-30 MED ORDER — HEPARIN SODIUM (PORCINE) 1000 UNIT/ML IJ SOLN
INTRAMUSCULAR | Status: DC | PRN
  Administered 2016-01-30: 5000 [IU] via INTRAVENOUS

## 2016-01-30 MED ORDER — PROPOFOL 10 MG/ML IV BOLUS
INTRAVENOUS | Status: AC
  Filled 2016-01-30: qty 20

## 2016-01-30 MED ORDER — PHENYLEPHRINE 40 MCG/ML (10ML) SYRINGE FOR IV PUSH (FOR BLOOD PRESSURE SUPPORT)
PREFILLED_SYRINGE | INTRAVENOUS | Status: DC | PRN
  Administered 2016-01-30 (×2): 120 ug via INTRAVENOUS

## 2016-01-30 MED ORDER — CHLORHEXIDINE GLUCONATE CLOTH 2 % EX PADS: 6.0000 | MEDICATED_PAD | Freq: Once | CUTANEOUS | Status: DC

## 2016-01-30 MED ORDER — LIDOCAINE 2% (20 MG/ML) 5 ML SYRINGE
INTRAMUSCULAR | Status: AC
  Filled 2016-01-30: qty 5

## 2016-01-30 MED ORDER — DEXTROSE 50 % IV SOLN
INTRAVENOUS | Status: AC
  Filled 2016-01-30: qty 50

## 2016-01-30 MED ORDER — FENTANYL CITRATE (PF) 100 MCG/2ML IJ SOLN
INTRAMUSCULAR | Status: DC | PRN
  Administered 2016-01-30: 25 ug via INTRAVENOUS

## 2016-01-30 MED ORDER — SODIUM CHLORIDE 0.9 % IV SOLN
INTRAVENOUS | Status: DC | PRN
  Administered 2016-01-30: 08:00:00

## 2016-01-30 MED ORDER — SUCCINYLCHOLINE CHLORIDE 200 MG/10ML IV SOSY
PREFILLED_SYRINGE | INTRAVENOUS | Status: AC
  Filled 2016-01-30: qty 10

## 2016-01-30 MED ORDER — DEXTROSE 50 % IV SOLN
INTRAVENOUS | Status: DC | PRN
  Administered 2016-01-30: 20 mL via INTRAVENOUS

## 2016-01-30 MED ORDER — HEPARIN SODIUM (PORCINE) 1000 UNIT/ML IJ SOLN
INTRAMUSCULAR | Status: AC
  Filled 2016-01-30: qty 1

## 2016-01-30 MED ORDER — DEXTROSE 5 % IV SOLN
1.5000 g | INTRAVENOUS | Status: AC
  Administered 2016-01-30: 1.5 g via INTRAVENOUS
  Filled 2016-01-30: qty 1.5

## 2016-01-30 MED ORDER — PHENYLEPHRINE 40 MCG/ML (10ML) SYRINGE FOR IV PUSH (FOR BLOOD PRESSURE SUPPORT)
PREFILLED_SYRINGE | INTRAVENOUS | Status: AC
  Filled 2016-01-30: qty 10

## 2016-01-30 MED ORDER — ONDANSETRON HCL 4 MG/2ML IJ SOLN
INTRAMUSCULAR | Status: AC
  Filled 2016-01-30: qty 2

## 2016-01-30 MED ORDER — PROPOFOL 10 MG/ML IV BOLUS
INTRAVENOUS | Status: DC | PRN
  Administered 2016-01-30: 190 mg via INTRAVENOUS

## 2016-01-30 MED ORDER — SODIUM CHLORIDE 0.9 % IV SOLN
INTRAVENOUS | Status: DC
  Administered 2016-01-30: 07:00:00 via INTRAVENOUS

## 2016-01-30 MED ORDER — ONDANSETRON HCL 4 MG/2ML IJ SOLN: 4.0000 mg | Freq: Once | INTRAMUSCULAR | Status: DC | PRN

## 2016-01-30 MED ORDER — LIDOCAINE 2% (20 MG/ML) 5 ML SYRINGE
INTRAMUSCULAR | Status: DC | PRN
  Administered 2016-01-30: 60 mg via INTRAVENOUS

## 2016-01-30 MED ORDER — EPHEDRINE 5 MG/ML INJ
INTRAVENOUS | Status: AC
  Filled 2016-01-30: qty 10

## 2016-01-30 MED ORDER — LIDOCAINE HCL (PF) 1 % IJ SOLN
INTRAMUSCULAR | Status: AC
  Filled 2016-01-30: qty 30

## 2016-01-30 MED ORDER — ONDANSETRON HCL 4 MG/2ML IJ SOLN
INTRAMUSCULAR | Status: DC | PRN
  Administered 2016-01-30: 4 mg via INTRAVENOUS

## 2016-01-30 SURGICAL SUPPLY — 33 items
ARMBAND PINK RESTRICT EXTREMIT (MISCELLANEOUS) ×2 IMPLANT
CANISTER SUCTION 2500CC (MISCELLANEOUS) ×2 IMPLANT
CANNULA VESSEL 3MM 2 BLNT TIP (CANNULA) ×2 IMPLANT
CLIP TI MEDIUM 6 (CLIP) ×2 IMPLANT
CLIP TI WIDE RED SMALL 6 (CLIP) ×2 IMPLANT
COVER PROBE W GEL 5X96 (DRAPES) ×2 IMPLANT
DECANTER SPIKE VIAL GLASS SM (MISCELLANEOUS) IMPLANT
DERMABOND ADVANCED (GAUZE/BANDAGES/DRESSINGS) ×1
DERMABOND ADVANCED .7 DNX12 (GAUZE/BANDAGES/DRESSINGS) ×1 IMPLANT
DRAIN PENROSE 1/4X12 LTX STRL (WOUND CARE) ×2 IMPLANT
ELECT REM PT RETURN 9FT ADLT (ELECTROSURGICAL) ×2
ELECTRODE REM PT RTRN 9FT ADLT (ELECTROSURGICAL) ×1 IMPLANT
GEL ULTRASOUND 20GR AQUASONIC (MISCELLANEOUS) IMPLANT
GLOVE BIO SURGEON STRL SZ 6.5 (GLOVE) ×4 IMPLANT
GLOVE BIO SURGEON STRL SZ7.5 (GLOVE) ×2 IMPLANT
GLOVE BIOGEL PI IND STRL 7.0 (GLOVE) ×1 IMPLANT
GLOVE BIOGEL PI INDICATOR 7.0 (GLOVE) ×1
GLOVE ECLIPSE 6.5 STRL STRAW (GLOVE) ×2 IMPLANT
GOWN STRL REUS W/ TWL LRG LVL3 (GOWN DISPOSABLE) ×3 IMPLANT
GOWN STRL REUS W/TWL LRG LVL3 (GOWN DISPOSABLE) ×3
HEMOSTAT SPONGE AVITENE ULTRA (HEMOSTASIS) IMPLANT
KIT BASIN OR (CUSTOM PROCEDURE TRAY) ×2 IMPLANT
KIT ROOM TURNOVER OR (KITS) ×2 IMPLANT
LOOP VESSEL MINI RED (MISCELLANEOUS) IMPLANT
NS IRRIG 1000ML POUR BTL (IV SOLUTION) ×2 IMPLANT
PACK CV ACCESS (CUSTOM PROCEDURE TRAY) ×2 IMPLANT
PAD ARMBOARD 7.5X6 YLW CONV (MISCELLANEOUS) ×4 IMPLANT
SUT PROLENE 7 0 BV 1 (SUTURE) ×2 IMPLANT
SUT VIC AB 3-0 SH 27 (SUTURE) ×2
SUT VIC AB 3-0 SH 27X BRD (SUTURE) ×2 IMPLANT
SUT VICRYL 4-0 PS2 18IN ABS (SUTURE) ×4 IMPLANT
UNDERPAD 30X30 (UNDERPADS AND DIAPERS) ×2 IMPLANT
WATER STERILE IRR 1000ML POUR (IV SOLUTION) ×2 IMPLANT

## 2016-01-30 NOTE — Anesthesia Procedure Notes (Signed)
Procedure Name: LMA Insertion Date/Time: 01/30/2016 7:56 AM Performed by: Myna Bright Pre-anesthesia Checklist: Patient identified, Emergency Drugs available, Suction available and Patient being monitored Patient Re-evaluated:Patient Re-evaluated prior to inductionOxygen Delivery Method: Circle system utilized Preoxygenation: Pre-oxygenation with 100% oxygen Intubation Type: IV induction Ventilation: Mask ventilation without difficulty LMA: LMA inserted LMA Size: 5.0 Tube type: Oral Number of attempts: 1 Placement Confirmation: positive ETCO2 and breath sounds checked- equal and bilateral Tube secured with: Tape Dental Injury: Teeth and Oropharynx as per pre-operative assessment

## 2016-01-30 NOTE — Addendum Note (Signed)
Addendum  created 01/30/16 1514 by Myna Bright, CRNA   Anesthesia Event edited

## 2016-01-30 NOTE — Anesthesia Preprocedure Evaluation (Signed)
Anesthesia Evaluation  Patient identified by MRN, date of birth, ID band Patient awake    Reviewed: Allergy & Precautions, NPO status , Patient's Chart, lab work & pertinent test results  Airway Mallampati: II  TM Distance: >3 FB Neck ROM: Full    Dental  (+) Teeth Intact, Loose,    Pulmonary former smoker,    breath sounds clear to auscultation       Cardiovascular hypertension,  Rhythm:Regular Rate:Normal     Neuro/Psych    GI/Hepatic   Endo/Other  diabetes  Renal/GU      Musculoskeletal   Abdominal   Peds  Hematology   Anesthesia Other Findings   Reproductive/Obstetrics                             Anesthesia Physical Anesthesia Plan  ASA: III  Anesthesia Plan: General   Post-op Pain Management:    Induction: Intravenous  Airway Management Planned: LMA  Additional Equipment:   Intra-op Plan:   Post-operative Plan: Extubation in OR  Informed Consent: I have reviewed the patients History and Physical, chart, labs and discussed the procedure including the risks, benefits and alternatives for the proposed anesthesia with the patient or authorized representative who has indicated his/her understanding and acceptance.   Dental advisory given  Plan Discussed with: CRNA and Anesthesiologist  Anesthesia Plan Comments:         Anesthesia Quick Evaluation

## 2016-01-30 NOTE — Telephone Encounter (Signed)
Sched appt 02/29/16 at 1:00 w/ PA. Spoke to pt to inform them of appt.

## 2016-01-30 NOTE — Discharge Instructions (Signed)
° ° °  01/30/2016 Jack [name] [id number] 1945-11-26  Surgeon(s): Elam Dutch, MD  Procedure(s): CEPHALIC VEIN TURNDOWN TO LEFT UPPER ARM   X May stick graft on designated area only:  May stick fistula distal to the incision.  Do not stick between incisions x 12 weeks. SEE DIAGRAM

## 2016-01-30 NOTE — H&P (View-Only) (Signed)
Referring Physician: Otelia Santee, MD  Patient name: Jack [name] MRN: [id number] DOB: 12-17-1945 Sex: male  REASON FOR CONSULT: Possible revision left arm AV fistula  HPI: Jack [name] is a 71 y.o. male,  who currently has a functioning left brachiocephalic AV fistula. This was originally placed in April 2014. He was noted on recent fistulogram to have narrowing of the cephalic junction to the subclavian vein.He states that currently the fistula has been working okay. Other medical problems include asthma, diabetes, end-stage renal disease Monday Wednesday Friday dialysis, peripheral arterial disease with prior right below-knee amputation, hypertension all of which are currently stable.  Past Medical History:  Diagnosis Date  . Anemia   . Asthma   . Contracture of joint    left knee  . Decubitus ulcer of sacral region, stage 2 03/07/2014  . Diabetes mellitus with peripheral vascular disease ([place])   . End-stage renal disease on hemodialysis [facility])    Started HD March 2014.  Cause of ESRD was DM.  Gets HD at [facility] on [place] on MWF schedule.   Marland Kitchen ESRD on hemodialysis ([place])   . Gangrene ([place])    right BKA  . Gangrene of foot ([place])   . Glaucoma   . Glaucoma 03/07/2014  . Heart murmur   . History of MRSA infection 04/22/2013   Bacteremia assoc w L foot wound infection Mar 2015   . Hyperparathyroidism, secondary renal ([place])   . Hypertension   . Hypothyroidism   . MRSA bacteremia   . Multiple myeloma, without mention of having achieved remission 03/30/2012   Cytogenetic neg on 03/23/2012.  Marland Kitchen Peripheral arterial disease ([place])   . Peripheral vascular disease, unspecified 11/19/2012   In the past had R foot toe amps then R TMA. In 2015 had left foot toe amp > then TMA >then L BKA on 05/14/13   . Pneumonia    2012  . Thyroid disease    hyperparathyroidism   Past Surgical History:  Procedure Laterality Date  . ABDOMINAL AORTAGRAM Bilateral 11/06/2012   Procedure:  ABDOMINAL AORTAGRAM;  Surgeon: Elam Dutch, MD;  Location: [facility] CATH LAB;  Service: Cardiovascular;  Laterality: Bilateral;  . AMPUTATION Right 11/10/2012   Procedure: AMPUTATION FIRST and SECOND TOES Right Foot;  Surgeon: Elam Dutch, MD;  Location: [place];  Service: Vascular;  Laterality: Right;  . AMPUTATION Left 04/07/2013   Procedure: AMPUTATION DIGIT- LEFT 1ST TOE;  Surgeon: Mal Misty, MD;  Location: [place];  Service: Vascular;  Laterality: Left;  . AMPUTATION Left 04/26/2013   Procedure: Left Foot Transmetatarsal Amputation;  Surgeon: Newt Minion, MD;  Location: [place];  Service: Orthopedics;  Laterality: Left;  . AMPUTATION Left 05/14/2013   Procedure: AMPUTATION BELOW KNEE;  Surgeon: Newt Minion, MD;  Location: [place];  Service: Orthopedics;  Laterality: Left;  Left Below Knee Amputation  . AMPUTATION Left 07/23/2013   Procedure: AMPUTATION BELOW KNEE;  Surgeon: Newt Minion, MD;  Location: [place];  Service: Orthopedics;  Laterality: Left;  Left Below Knee Amputation Revision  . AMPUTATION Right 02/18/2014   Procedure: AMPUTATION BELOW KNEE;  Surgeon: Newt Minion, MD;  Location: [place];  Service: Orthopedics;  Laterality: Right;  . AV FISTULA PLACEMENT Left 03/25/2012   Procedure: ARTERIOVENOUS (AV) FISTULA CREATION;  Surgeon: Mal Misty, MD;  Location: [place];  Service: Vascular;  Laterality: Left;  . BELOW KNEE LEG AMPUTATION Left 05/14/2013   DR [name]  .  BELOW KNEE LEG AMPUTATION Right 02/18/2014   dr duda  . CARDIAC CATHETERIZATION     approx 30 years ago  . CERVICAL DISC SURGERY    . EYE SURGERY Bilateral    CATARACTS  . I&D EXTREMITY Left 04/22/2013   Procedure: IRRIGATION AND DEBRIDEMENT LEFT FIRST TOE AMPUTATION WOUND ;  Surgeon: Mal Misty, MD;  Location: [place];  Service: Vascular;  Laterality: Left;  . I&D EXTREMITY Right 01/05/2014   Procedure: IRRIGATION AND DEBRIDEMENT Right Heel Ulcer;  Surgeon: Mcarthur Rossetti, MD;  Location: [place];  Service:  Orthopedics;  Laterality: Right;  Surgeon Available after 5PM  . INSERTION OF DIALYSIS CATHETER Right 03/19/2012   Procedure: INSERTION OF DIALYSIS CATHETER;  Surgeon: Mal Misty, MD;  Location: [place];  Service: Vascular;  Laterality: Right;  Right Internal Jugular  . LIGATION OF COMPETING BRANCHES OF ARTERIOVENOUS FISTULA Left 05/08/2012   Procedure: LIGATION OF COMPETING BRANCHES OF ARTERIOVENOUS FISTULA;  Surgeon: Mal Misty, MD;  Location: [place];  Service: Vascular;  Laterality: Left;  Ultrasound guided  . REPAIR QUADRICEPS/HAMSTRING MUSCLES Left 05/20/2014   Procedure: Left Hamstring Release;  Surgeon: Newt Minion, MD;  Location: [place];  Service: Orthopedics;  Laterality: Left;  . STUMP REVISION Right 05/20/2014   Procedure: Revision Right Below Knee Amputation;  Surgeon: Newt Minion, MD;  Location: [place];  Service: Orthopedics;  Laterality: Right;  . TEE WITHOUT CARDIOVERSION N/A 04/20/2013   Procedure: TRANSESOPHAGEAL ECHOCARDIOGRAM (TEE);  Surgeon: Josue Hector, MD;  Location: [facility] ENDOSCOPY;  Service: Cardiovascular;  Laterality: N/A;  . THYROIDECTOMY    . TOE AMPUTATION     D/C 04-30-13  . TRANSMETATARSAL AMPUTATION Left 12/16/2012   Procedure: TRANSMETATARSAL AMPUTATION AND VAC PLACEMENT;  Surgeon: Elam Dutch, MD;  Location: [facility];  Service: Vascular;  Laterality: Left;    Family History  Problem Relation Age of Onset  . Diabetes Mother   . Cancer Mother     bone   . Kidney disease Father     SOCIAL HISTORY: Social History   Social History  . Marital status: Divorced    Spouse name: N/A  . Number of children: N/A  . Years of education: N/A   Occupational History  . Not on file.   Social History Main Topics  . Smoking status: Former Smoker    Types: Cigarettes    Quit date: 03/19/1982  . Smokeless tobacco: Never Used  . Alcohol use No     Comment: formerly  . Drug use: No  . Sexual activity: No   Other Topics Concern  . Not on file   Social History  Narrative  . No narrative on file    Allergies  Allergen Reactions  . Bee Venom Anaphylaxis  . Lisinopril Cough  . Ivp Dye [Iodinated Diagnostic Agents] Hives  . Morphine And Related Hives and Other (See Comments)    Bradycardia states patient    Current Outpatient Prescriptions  Medication Sig Dispense Refill  . aspirin EC 81 MG tablet Take 81 mg by mouth daily.    . calcium acetate (PHOSLO) 667 MG capsule Take 1,334 mg by mouth 3 (three) times daily with meals.    Marland Kitchen glipiZIDE (GLUCOTROL) 5 MG tablet TAKE 1 TABLET EVERY DAY BEFORE BREAKFAST 90 tablet 1  . levothyroxine (SYNTHROID, LEVOTHROID) 150 MCG tablet TAKE 1 TABLET (150 MCG TOTAL) BY MOUTH DAILY. 90 tablet 1  . midodrine (PROAMATINE) 10 MG tablet Take 10 mg by mouth  3 (three) times a week.    . multivitamin (RENA-VIT) TABS tablet Take 1 tablet by mouth at bedtime.    Marland Kitchen oxyCODONE-acetaminophen (ROXICET) 5-325 MG tablet Take 1 tablet by mouth every 12 (twelve) hours as needed for moderate pain or severe pain. 40 tablet 0   No current facility-administered medications for this visit.     ROS:   General:  No weight loss, Fever, chills  HEENT: No recent headaches, no nasal bleeding, no visual changes, no sore throat  Neurologic: No dizziness, blackouts, seizures. No recent symptoms of stroke or mini- stroke. No recent episodes of slurred speech, or temporary blindness.  Cardiac: No recent episodes of chest pain/pressure, no shortness of breath at rest.  No shortness of breath with exertion.  Denies history of atrial fibrillation or irregular heartbeat  Vascular: No history of rest pain in feet.  No history of claudication.  No history of non-healing ulcer, No history of DVT   Pulmonary: No home oxygen, no productive cough, no hemoptysis,  No asthma or wheezing  Musculoskeletal:  _0  Arthritis, _1  Low back pain,  _2  Joint pain  Hematologic:No history of hypercoagulable state.  No history of easy bleeding.  No history of  anemia  Gastrointestinal: No hematochezia or melena,  No gastroesophageal reflux, no trouble swallowing  Urinary: _3  chronic Kidney disease, _4  on HD - _5  MWF or _6  TTHS, _7  Burning with urination, _8  Frequent urination, _9  Difficulty urinating;   Skin: No rashes  Psychological: No history of anxiety,  No history of depression   Physical Examination  Vitals:   01/18/16 1413  BP: (!) 156/88  Pulse: 91  Resp: 16  Temp: 99.3 F (37.4 C)  TempSrc: Oral  SpO2: 100%  Weight: 170 lb (77.1 kg)  Height: _10  (1.778 m)    Body mass index is 24.39 kg/m.  General:  Alert and oriented, no acute distress HEENT: Normal Neck: No bruit or JVD Pulmonary: Clear to auscultation bilaterally Cardiac: Regular Rate and Rhythm without murmur Skin: No rash Extremity Pulses:  Palpable thrill left upper arm AV fistula, mild aneurysmal degeneration midportion Musculoskeletal: No deformity or edema  Neurologic: Upper and lower extremity motor 5/5 and symmetric  DATA:  Patient had a duplex ultrasound of his AV fistula today. This showed good flow throughout the fistula with diameter 7-9 mm no obvious narrowing  I also reviewed Dr. Aron Baba recent fistulogram which showed a diffuse narrowed segment over about 3 cm at the cephalic subclavian junction. This responded well to angioplasty.  ASSESSMENT:  Patient at risk for recurrent narrowing cephalic subclavian junction for left arm AV fistula outflow.   PLAN:  Left cephalic vein turndown scheduled for 01/30/2016. Risks benefits possible, patient and procedure details were discussed the patient today including but not limited to bleeding infection fistula thrombosis need for additional procedures. He understands and agrees to proceed.   [name], MD [facility] Office: [phone] Pager: [phone]

## 2016-01-30 NOTE — Transfer of Care (Signed)
Immediate Anesthesia Transfer of Care Note  Patient: Jack [name]  Procedure(s) Performed: Procedure(s): CEPHALIC VEIN TURNDOWN TO LEFT UPPER ARM (Left)  Patient Location: PACU  Anesthesia Type:General  Level of Consciousness: awake, alert , oriented and patient cooperative  Airway & Oxygen Therapy: Patient Spontanous Breathing and Patient connected to nasal cannula oxygen  Post-op Assessment: Report given to RN, Post -op Vital signs reviewed and stable and Patient moving all extremities  Post vital signs: Reviewed and stable  Last Vitals:  Vitals:   01/30/16 0654  BP: (!) 173/87  Pulse: 66  Resp: 18  Temp: 36.8 C    Last Pain:  Vitals:   01/30/16 0654  TempSrc: Oral         Complications: No apparent anesthesia complications

## 2016-01-30 NOTE — Telephone Encounter (Signed)
-----   Message from Mena Goes, RN sent at 01/30/2016 11:46 AM EST ----- Regarding: 4 weeks no study   ----- Message ----- From: Gabriel Earing, PA-C Sent: 01/30/2016   9:14 AM To: Vvs Charge Pool  S/p left cephalic vein turndown [id number].  F/u with Dr. Oneida Alar in 4 weeks.  No study needed.  Thanks, Aldona Bar

## 2016-01-30 NOTE — Interval H&P Note (Signed)
History and Physical Interval Note:  01/30/2016 7:26 AM  Jack [name]  has presented today for surgery, with the diagnosis of End Stage Renal Disease N18.6  The various methods of treatment have been discussed with the patient and family. After consideration of risks, benefits and other options for treatment, the patient has consented to  Procedure(s): CEPHALIC VEIN TURNDOWN (Left) as a surgical intervention .  The patient's history has been reviewed, patient examined, no change in status, stable for surgery.  I have reviewed the patient's chart and labs.  Questions were answered to the patient's satisfaction.     [name]

## 2016-01-30 NOTE — Anesthesia Postprocedure Evaluation (Addendum)
Anesthesia Post Note  Patient: Jack [name]  Procedure(s) Performed: Procedure(s) (LRB): CEPHALIC VEIN TURNDOWN TO LEFT UPPER ARM (Left)  Patient location during evaluation: PACU Anesthesia Type: General Level of consciousness: awake, awake and alert and oriented Pain management: pain level controlled Vital Signs Assessment: post-procedure vital signs reviewed and stable Respiratory status: spontaneous breathing, nonlabored ventilation and respiratory function stable Cardiovascular status: blood pressure returned to baseline Anesthetic complications: no       Last Vitals:  Vitals:   01/30/16 1005 01/30/16 1010  BP: 129/68 125/71  Pulse: (!) 58 (!) 57  Resp: 11 10  Temp:  36.4 C    Last Pain:  Vitals:   01/30/16 1010  TempSrc:   PainSc: 0-No pain                 Stepfanie Yott COKER

## 2016-01-31 ENCOUNTER — Encounter (HOSPITAL_COMMUNITY): Payer: Self-pay | Admitting: Vascular Surgery

## 2016-01-31 NOTE — Op Note (Signed)
Procedure: Left cephalic vein turndown  Preoperative diagnosis: Poorly functioning left arm AV fistula  Postoperative diagnosis: Same  Anesthesia: Gen.  Assistant: Leontine Locket PA-C  Operative details: After obtaining informed consent, the patient was taken the operating room. The patient was placed in supine position operating table. After induction of general anesthesia the patient's entire left upper extremity was prepped and draped in usual sterile fashion. Next a longitudinal incision was made over the distal cephalic vein up at the level of the shoulder. The incision was carried into the subcutaneoustissues down to level cephalic vein. This was a functioning brachiocephalic fistula. He was dissected free circumferentially and several small side branches ligated and divided between silk ties. Next a longitudinal incision was made in the left axilla. Incision was carried onto subcutaneous tissues down level axillary vein. This was of good quality approximately is in diameter. The fistula was about 7-8 mm in diameter. At this point a subcutaneous tunnel was created connecting the axillary to the shoulder incision. The patient was given 5000 intravenous heparin. The distal cephalic vein was ligated with a 2-0 silk tie. The vein portion of the fistula was controlled proximally with a fistula clamp. After the vena been transected it was swung over under the subcutaneous tunnel down to level axillary vein. The distal axillary vein was ligated with a 2-0 silk tie. The vein was controlled proximally with a fine bulldog clamp. The cephalic vein fistula was then sewn end and to the axillary vein using a running 6-0 Prolene suture. Just prior completion anastomosis was forebled backbled and thoroughly flushed. Anastomosis was secured clamps released there was a palpable thrill the fistula immediately. Hemostasis was obtained. Both incisions were closed with running 3-0 Vicryl suture and subcutaneous layers  followed by 4 Vicryl subcuticular stitch followed by Dermabond on the skin. The patient tolerated the procedure well and there were complications. Instrument sponge and needle counts correct in the case. The patient was taken to recovery room in stable condition.  [name], MD [facility] Office: [phone] Pager: [phone]

## 2016-02-22 ENCOUNTER — Encounter: Payer: Self-pay | Admitting: Vascular Surgery

## 2016-02-29 ENCOUNTER — Encounter: Payer: Self-pay | Admitting: Vascular Surgery

## 2016-02-29 ENCOUNTER — Ambulatory Visit (INDEPENDENT_AMBULATORY_CARE_PROVIDER_SITE_OTHER): Payer: Self-pay | Admitting: Vascular Surgery

## 2016-02-29 VITALS — BP 153/87 | HR 78 | Temp 98.6°F | Resp 20 | Ht 70.0 in | Wt 170.0 lb

## 2016-02-29 DIAGNOSIS — Z992 Dependence on renal dialysis: Secondary | ICD-10-CM

## 2016-02-29 DIAGNOSIS — N186 End stage renal disease: Secondary | ICD-10-CM

## 2016-02-29 NOTE — Progress Notes (Signed)
Patient is a 71 year old male who returns for postoperative follow-up today after recent cephalic vein turndown to the axillary vein. He states his fistula is working well. He denies any numbness or tingling in his hand. He has no incisional drainage.  Physical exam:  Vitals:   02/29/16 1153  BP: (!) 153/87  Pulse: 78  Resp: 20  Temp: 98.6 F (37 C)  TempSrc: Oral  SpO2: 97%  Weight: 170 lb (77.1 kg)  Height: 5\' 10"  (1.778 m)    Left upper extremity: Healing axillary and deltoid incision palpable thrill in fistula no erythema no drainage from incisions  Assessment: Doing well status post cephalic vein turndown. The patient will follow-up on as-needed basis.  [name], MD [facility] [place] Office: [phone] Pager: [phone]

## 2016-03-15 DIAGNOSIS — N2581 Secondary hyperparathyroidism of renal origin: Secondary | ICD-10-CM | POA: Diagnosis not present

## 2016-03-15 DIAGNOSIS — N186 End stage renal disease: Secondary | ICD-10-CM | POA: Diagnosis not present

## 2016-03-15 DIAGNOSIS — E1029 Type 1 diabetes mellitus with other diabetic kidney complication: Secondary | ICD-10-CM | POA: Diagnosis not present

## 2016-03-15 DIAGNOSIS — D689 Coagulation defect, unspecified: Secondary | ICD-10-CM | POA: Diagnosis not present

## 2016-03-15 DIAGNOSIS — D631 Anemia in chronic kidney disease: Secondary | ICD-10-CM | POA: Diagnosis not present

## 2016-03-18 DIAGNOSIS — N186 End stage renal disease: Secondary | ICD-10-CM | POA: Diagnosis not present

## 2016-03-18 DIAGNOSIS — D689 Coagulation defect, unspecified: Secondary | ICD-10-CM | POA: Diagnosis not present

## 2016-03-18 DIAGNOSIS — E1029 Type 1 diabetes mellitus with other diabetic kidney complication: Secondary | ICD-10-CM | POA: Diagnosis not present

## 2016-03-18 DIAGNOSIS — D631 Anemia in chronic kidney disease: Secondary | ICD-10-CM | POA: Diagnosis not present

## 2016-03-18 DIAGNOSIS — N2581 Secondary hyperparathyroidism of renal origin: Secondary | ICD-10-CM | POA: Diagnosis not present

## 2016-03-18 NOTE — Progress Notes (Signed)
HPI:   Mr.Bliss Gee Habig is a 71 y.o. male, who is here today with his daughter to follow on some chronic medical problems.  Last seen on 11/21/15. Since his last OV he had surgical revision of left arm AV fistula.He followed with vascular last on 02/29/16, Dr Oneida Alar. He is still having problems with left A-V fistula, so he is waiting for appt.  Reporting recent vaccination for hepatitis B because low ab. He has also been screened for hep C.  Hypothyroidism:  Currently he is on Levothyroxine 150 mcg daily x 6 d and 1/2 tab Sunday's. Tolerating medication well, no side effects reported.  He has not noted dysphagia, palpitations,  changes in bowel habits, tremor, cold/heat intolerance, or abnormal weight loss.  Lab Results  Component Value Date   TSH 0.42 11/21/2015    DM II:  Currently he is on Glipizide 5 mg daily, decreased from 5 mg last OV.He is still taking 5 mg daily. BS: 150 today, and this is his average, 150's No hypoglycemia. + Diabetic retinopathy,he is following with ophthalmologists,vison lest left eye.  Complicated with vascular disease (PAD).  He denies abdominal pain, nausea,vomiting, polidypsia,or polyphagia.  Lab Results  Component Value Date   HGBA1C 7.0 (H) 11/21/2015   ESRD on hemodialysis 3 times per week, last hemodialysis yesterday , Midodrine was decreased from 10 mg  to 5 mg.   Lab Results  Component Value Date   CHOL 185 11/21/2015   HDL 45.60 11/21/2015   LDLCALC 116 (H) 11/21/2015   TRIG 114.0 11/21/2015   CHOLHDL 4 11/21/2015   Last OV Lipitor was recommended,he did not start medication.  Chronic LE pain, phantom pain. He is on Percocet 5-325 mg daily as needed.He takes medication mainly at night and helps him rest. He denies side effects.  Pain can be 10/10 but medications helps, ran out of medication.   Concerns today: He is recovering from cold, "not bad", no fever, no chills,no wheezing,and no dyspnea.  He is  still coughing, non productive. A few months ago he was also c/o of persistent cough,CXR was ordered but he did not have it done. Cough eventually resolved.  + Former smoker.   Review of Systems  Constitutional: Positive for fatigue (No more than usual). Negative for appetite change, chills and fever.  HENT: Negative for dental problem, mouth sores, nosebleeds, sore throat and trouble swallowing.   Eyes: Positive for visual disturbance. Negative for pain and redness.  Respiratory: Positive for cough. Negative for shortness of breath and wheezing.   Cardiovascular: Negative for chest pain, palpitations and leg swelling.  Gastrointestinal: Negative for abdominal pain, nausea and vomiting.       No changes in bowel habits.  Endocrine: Negative for cold intolerance, heat intolerance, polydipsia and polyphagia.  Genitourinary: Negative for hematuria.  Musculoskeletal: Positive for gait problem and myalgias.  Skin: Negative for rash and wound.  Neurological: Negative for syncope, weakness and headaches.  Psychiatric/Behavioral: Positive for sleep disturbance (due to pain.). Negative for confusion. The patient is not nervous/anxious.       Current Outpatient Prescriptions on File Prior to Visit  Medication Sig Dispense Refill  . aspirin EC 81 MG tablet Take 81 mg by mouth daily.    . calcium acetate (PHOSLO) 667 MG capsule Take 1,334 mg by mouth 3 (three) times daily with meals.    . diphenhydrAMINE (BENADRYL) 25 MG tablet Take 100 mg by mouth as directed. The morning of fistula cleaning    .  levothyroxine (SYNTHROID, LEVOTHROID) 150 MCG tablet TAKE 1 TABLET (150 MCG TOTAL) BY MOUTH DAILY. 90 tablet 1  . midodrine (PROAMATINE) 10 MG tablet Take 10 mg by mouth 3 (three) times a week. Mon, Wed, Fri before dialysis    . multivitamin (RENA-VIT) TABS tablet Take 1 tablet by mouth at bedtime.    . predniSONE (DELTASONE) 20 MG tablet Take 40 mg by mouth. Take 72ms twice daily the day before  fistula cleaning and 449m the day of the fistula cleaning     No current facility-administered medications on file prior to visit.      Past Medical History:  Diagnosis Date  . Anemia   . Asthma   . Contracture of joint    left knee  . Decubitus ulcer of sacral region, stage 2 03/07/2014  . Diabetes mellitus with peripheral vascular disease ([facility]  . [facility]   Started HD March 2014.  Cause of ESRD was DM.  [facility]   . Mar[place] KitchenSRD on hemodialysis ([facility]  . Gangrene ([facility]   right BKA  . Gangrene of foot ([facility]  . Glaucoma   . Glaucoma 03/07/2014  . Heart murmur   . History of MRSA infection 04/22/2013   Bacteremia assoc w L foot wound infection Mar 2015   . Hyperparathyroidism, secondary renal ([facility]  . Hypertension   . Hypothyroidism   . MRSA bacteremia   . Multiple myeloma, without mention of having achieved remission 03/30/2012   Cytogenetic neg on 03/23/2012.  . Marland Kitcheneripheral arterial disease ([facility]CD[facility]  . Peripheral vascular disease, unspecified 11/19/2012   In the past had R foot toe amps then R TMA. In 2015 had left foot toe amp > then TMA >then L BKA on 05/14/13   . Pneumonia    2012  . Thyroid disease    hyperparathyroidism   Allergies  Allergen Reactions  . Bee Venom Anaphylaxis  . Lisinopril Cough  . Ivp Dye [Iodinated Diagnostic Agents] Hives  . Morphine And Related Hives and Other (See Comments)    Bradycardia states patient    Social History   Social History  . Marital status: Divorced    Spouse name: N/A  . Number of children: N/A  . Years of education: N/A   Social History Main Topics  . Smoking status: Former Smoker    Types: Cigarettes    Quit date: 03/19/1982  . Smokeless tobacco: Never Used  . Alcohol use No     Comment: formerly  . Drug use: No  . Sexual activity: No   Other Topics Concern  . None   Social History Narrative  . None    Vitals:   03/19/16 0802  BP:  140/80  Pulse: 63  Resp: 12  O2 sat 91% at RA. There is no height or weight on file to calculate BMI.    Physical Exam  Nursing note and vitals reviewed. Constitutional: He is oriented to person, place, and time. He appears well-developed and well-nourished. No distress.  HENT:  Head: Atraumatic.  Mouth/Throat: Oropharynx is clear and moist and mucous membranes are normal.  Eyes: Conjunctivae and EOM are normal. Left pupil is not reactive. Pupils are unequal.  Cardiovascular: Normal rate.  An irregular rhythm present.  Occasional extrasystoles are present.  Murmur (SEM II/VI RUSB>LUSB) heard. Hx of sinus arrhythmia.  Respiratory: Effort normal. No respiratory distress. He has rales (fine rales right  base).  GI: Soft. He exhibits no mass. There is no tenderness.  Musculoskeletal: He exhibits no edema or tenderness.  BKA bilateral.  He has leg prothesis on today.  Lymphadenopathy:    He has no cervical adenopathy.  Neurological: He is alert and oriented to person, place, and time. He has normal strength.  He is in a wheelchair.  Skin: Skin is warm. No erythema.  Psychiatric: He has a normal mood and affect.  Well groomed, good eye contact.      ASSESSMENT AND PLAN:    Rehman was seen today for follow-up.  Diagnoses and all orders for this visit:  Diabetes mellitus with peripheral vascular disease ([place])  HgA1C pending today. LDL elevated,Rx for Lipitor sent to his pharmacy. Continue Glipizide 2.5 mg,further recommendations will be given according to lab result. Goal < 8.0. Avoidance of added sugar food intake is an important part of treatment and recommended. Continue following with ophthalmologists and periodic dental care recommended. F/U in 4 months   -     Hemoglobin A1c -     glipiZIDE (GLUCOTROL) 5 MG tablet; Take 0.5 tablets (2.5 mg total) by mouth daily before breakfast.  Phantom limb syndrome with pain ([place])  Continue current management,shich has  helped. Some side effects discussed. Medication contract signed 08/2015. F/U in 3-4 months.  -     Discontinue: oxyCODONE-acetaminophen (ROXICET) 5-325 MG tablet; Take 1 tablet by mouth every 12 (twelve) hours as needed for moderate pain or severe pain. -     oxyCODONE-acetaminophen (ROXICET) 5-325 MG tablet; Take 1 tablet by mouth every 12 (twelve) hours as needed for moderate pain or severe pain.  Hypothyroidism following radioiodine therapy  No changes in current management, will follow labs done today and will give further recommendations accordingly. F/U in 4-6 months.  -     TSH  Respiratory tract infection  Rales on auscultation today. Abx side effects discussed. Instructed about warning signs. CXR order still current,recommend having it done today. Instructed about warning signs.  -     doxycycline (VIBRA-TABS) 100 MG tablet; Take 1 tablet (100 mg total) by mouth 2 (two) times daily.  Need for vaccination with 13-polyvalent pneumococcal conjugate vaccine -     Pneumococcal conjugate vaccine 13-valent  Other orders -     atorvastatin (LIPITOR) 10 MG tablet; Take 1 tablet (10 mg total) by mouth daily.    -Mr. Bonney Leitz was advised to return sooner than planned today if new concerns arise.       Betty G. Martinique, MD  [facility]. [place] office.

## 2016-03-19 ENCOUNTER — Ambulatory Visit (INDEPENDENT_AMBULATORY_CARE_PROVIDER_SITE_OTHER): Payer: Medicare Other | Admitting: Family Medicine

## 2016-03-19 ENCOUNTER — Encounter: Payer: Self-pay | Admitting: Family Medicine

## 2016-03-19 VITALS — BP 140/80 | HR 63 | Resp 12 | Ht 70.0 in

## 2016-03-19 DIAGNOSIS — J988 Other specified respiratory disorders: Secondary | ICD-10-CM

## 2016-03-19 DIAGNOSIS — G546 Phantom limb syndrome with pain: Secondary | ICD-10-CM | POA: Diagnosis not present

## 2016-03-19 DIAGNOSIS — E1151 Type 2 diabetes mellitus with diabetic peripheral angiopathy without gangrene: Secondary | ICD-10-CM

## 2016-03-19 DIAGNOSIS — E890 Postprocedural hypothyroidism: Secondary | ICD-10-CM | POA: Diagnosis not present

## 2016-03-19 DIAGNOSIS — Z23 Encounter for immunization: Secondary | ICD-10-CM | POA: Diagnosis not present

## 2016-03-19 LAB — HEMOGLOBIN A1C: Hgb A1c MFr Bld: 7 % — ABNORMAL HIGH (ref 4.6–6.5)

## 2016-03-19 LAB — TSH: TSH: 0.92 u[IU]/mL (ref 0.35–4.50)

## 2016-03-19 MED ORDER — OXYCODONE-ACETAMINOPHEN 5-325 MG PO TABS: 1.0000 | ORAL_TABLET | Freq: Two times a day (BID) | ORAL | 0 refills | Status: DC | PRN

## 2016-03-19 MED ORDER — GLIPIZIDE 5 MG PO TABS: 2.5000 mg | ORAL_TABLET | Freq: Every day | ORAL | 1 refills | Status: DC

## 2016-03-19 MED ORDER — ATORVASTATIN CALCIUM 10 MG PO TABS: 10.0000 mg | ORAL_TABLET | Freq: Every day | ORAL | 1 refills | Status: DC

## 2016-03-19 MED ORDER — DOXYCYCLINE HYCLATE 100 MG PO TABS: 100.0000 mg | ORAL_TABLET | Freq: Two times a day (BID) | ORAL | 0 refills | Status: AC

## 2016-03-19 NOTE — Progress Notes (Signed)
Pre visit review using our clinic review tool, if applicable. No additional management support is needed unless otherwise documented below in the visit note. 

## 2016-03-19 NOTE — Patient Instructions (Signed)
A few things to remember from today's visit:   Diabetes mellitus with peripheral vascular disease ([place]) - Plan: Hemoglobin A1c  Hypothyroidism following radioiodine therapy - Plan: TSH  Phantom limb (HCC)  Phantom limb syndrome with pain ([place]) - Plan: oxyCODONE-acetaminophen (ROXICET) 5-325 MG tablet  Respiratory tract infection  Glipizide 2.5 mg.  Please have X ray done.  Antibiotic started because some noises on right base of lungs, can cause diarrhea, take it with food.   Prevnar vaccine given today.  Lipitor to take with supper for cholesterol.  Please be sure medication list is accurate. If a new problem present, please set up appointment sooner than planned today.

## 2016-03-20 DIAGNOSIS — N2581 Secondary hyperparathyroidism of renal origin: Secondary | ICD-10-CM | POA: Diagnosis not present

## 2016-03-20 DIAGNOSIS — D631 Anemia in chronic kidney disease: Secondary | ICD-10-CM | POA: Diagnosis not present

## 2016-03-20 DIAGNOSIS — D689 Coagulation defect, unspecified: Secondary | ICD-10-CM | POA: Diagnosis not present

## 2016-03-20 DIAGNOSIS — E1029 Type 1 diabetes mellitus with other diabetic kidney complication: Secondary | ICD-10-CM | POA: Diagnosis not present

## 2016-03-20 DIAGNOSIS — N186 End stage renal disease: Secondary | ICD-10-CM | POA: Diagnosis not present

## 2016-03-22 DIAGNOSIS — N186 End stage renal disease: Secondary | ICD-10-CM | POA: Diagnosis not present

## 2016-03-22 DIAGNOSIS — D689 Coagulation defect, unspecified: Secondary | ICD-10-CM | POA: Diagnosis not present

## 2016-03-22 DIAGNOSIS — D631 Anemia in chronic kidney disease: Secondary | ICD-10-CM | POA: Diagnosis not present

## 2016-03-22 DIAGNOSIS — N2581 Secondary hyperparathyroidism of renal origin: Secondary | ICD-10-CM | POA: Diagnosis not present

## 2016-03-22 DIAGNOSIS — E1029 Type 1 diabetes mellitus with other diabetic kidney complication: Secondary | ICD-10-CM | POA: Diagnosis not present

## 2016-03-25 DIAGNOSIS — N2581 Secondary hyperparathyroidism of renal origin: Secondary | ICD-10-CM | POA: Diagnosis not present

## 2016-03-25 DIAGNOSIS — N186 End stage renal disease: Secondary | ICD-10-CM | POA: Diagnosis not present

## 2016-03-25 DIAGNOSIS — D689 Coagulation defect, unspecified: Secondary | ICD-10-CM | POA: Diagnosis not present

## 2016-03-25 DIAGNOSIS — E1029 Type 1 diabetes mellitus with other diabetic kidney complication: Secondary | ICD-10-CM | POA: Diagnosis not present

## 2016-03-25 DIAGNOSIS — D631 Anemia in chronic kidney disease: Secondary | ICD-10-CM | POA: Diagnosis not present

## 2016-03-27 DIAGNOSIS — E1029 Type 1 diabetes mellitus with other diabetic kidney complication: Secondary | ICD-10-CM | POA: Diagnosis not present

## 2016-03-27 DIAGNOSIS — N186 End stage renal disease: Secondary | ICD-10-CM | POA: Diagnosis not present

## 2016-03-27 DIAGNOSIS — D631 Anemia in chronic kidney disease: Secondary | ICD-10-CM | POA: Diagnosis not present

## 2016-03-27 DIAGNOSIS — N2581 Secondary hyperparathyroidism of renal origin: Secondary | ICD-10-CM | POA: Diagnosis not present

## 2016-03-27 DIAGNOSIS — D689 Coagulation defect, unspecified: Secondary | ICD-10-CM | POA: Diagnosis not present

## 2016-03-28 ENCOUNTER — Encounter: Payer: Self-pay | Admitting: Family Medicine

## 2016-03-28 DIAGNOSIS — T82858A Stenosis of vascular prosthetic devices, implants and grafts, initial encounter: Secondary | ICD-10-CM | POA: Diagnosis not present

## 2016-03-28 DIAGNOSIS — N186 End stage renal disease: Secondary | ICD-10-CM | POA: Diagnosis not present

## 2016-03-28 DIAGNOSIS — I872 Venous insufficiency (chronic) (peripheral): Secondary | ICD-10-CM | POA: Diagnosis not present

## 2016-03-28 DIAGNOSIS — Z992 Dependence on renal dialysis: Secondary | ICD-10-CM | POA: Diagnosis not present

## 2016-03-29 ENCOUNTER — Telehealth (INDEPENDENT_AMBULATORY_CARE_PROVIDER_SITE_OTHER): Payer: Self-pay | Admitting: Orthopedic Surgery

## 2016-03-29 DIAGNOSIS — E1029 Type 1 diabetes mellitus with other diabetic kidney complication: Secondary | ICD-10-CM | POA: Diagnosis not present

## 2016-03-29 DIAGNOSIS — N186 End stage renal disease: Secondary | ICD-10-CM | POA: Diagnosis not present

## 2016-03-29 DIAGNOSIS — N2581 Secondary hyperparathyroidism of renal origin: Secondary | ICD-10-CM | POA: Diagnosis not present

## 2016-03-29 DIAGNOSIS — D689 Coagulation defect, unspecified: Secondary | ICD-10-CM | POA: Diagnosis not present

## 2016-03-29 DIAGNOSIS — D631 Anemia in chronic kidney disease: Secondary | ICD-10-CM | POA: Diagnosis not present

## 2016-03-29 NOTE — Telephone Encounter (Signed)
Patient saw an upright walker on a tv commercial ([employer] Walking Aid/Upright [place] $594.95 on [employer]). He called [employer] and they told him they will pay for it if he has a script from his doctor. cb  [phone]. Patient aware that doctor will not be in until Monday.

## 2016-04-01 DIAGNOSIS — N2581 Secondary hyperparathyroidism of renal origin: Secondary | ICD-10-CM | POA: Diagnosis not present

## 2016-04-01 DIAGNOSIS — D689 Coagulation defect, unspecified: Secondary | ICD-10-CM | POA: Diagnosis not present

## 2016-04-01 DIAGNOSIS — E1029 Type 1 diabetes mellitus with other diabetic kidney complication: Secondary | ICD-10-CM | POA: Diagnosis not present

## 2016-04-01 DIAGNOSIS — N186 End stage renal disease: Secondary | ICD-10-CM | POA: Diagnosis not present

## 2016-04-01 DIAGNOSIS — D631 Anemia in chronic kidney disease: Secondary | ICD-10-CM | POA: Diagnosis not present

## 2016-04-02 ENCOUNTER — Other Ambulatory Visit (INDEPENDENT_AMBULATORY_CARE_PROVIDER_SITE_OTHER): Payer: Self-pay

## 2016-04-02 DIAGNOSIS — Z89511 Acquired absence of right leg below knee: Secondary | ICD-10-CM

## 2016-04-02 DIAGNOSIS — Z89512 Acquired absence of left leg below knee: Secondary | ICD-10-CM

## 2016-04-02 NOTE — Telephone Encounter (Signed)
This order was written and mailed to pt today.

## 2016-04-03 DIAGNOSIS — N2581 Secondary hyperparathyroidism of renal origin: Secondary | ICD-10-CM | POA: Diagnosis not present

## 2016-04-03 DIAGNOSIS — D689 Coagulation defect, unspecified: Secondary | ICD-10-CM | POA: Diagnosis not present

## 2016-04-03 DIAGNOSIS — E1029 Type 1 diabetes mellitus with other diabetic kidney complication: Secondary | ICD-10-CM | POA: Diagnosis not present

## 2016-04-03 DIAGNOSIS — N186 End stage renal disease: Secondary | ICD-10-CM | POA: Diagnosis not present

## 2016-04-03 DIAGNOSIS — D631 Anemia in chronic kidney disease: Secondary | ICD-10-CM | POA: Diagnosis not present

## 2016-04-05 DIAGNOSIS — N186 End stage renal disease: Secondary | ICD-10-CM | POA: Diagnosis not present

## 2016-04-05 DIAGNOSIS — N2581 Secondary hyperparathyroidism of renal origin: Secondary | ICD-10-CM | POA: Diagnosis not present

## 2016-04-05 DIAGNOSIS — D689 Coagulation defect, unspecified: Secondary | ICD-10-CM | POA: Diagnosis not present

## 2016-04-05 DIAGNOSIS — D631 Anemia in chronic kidney disease: Secondary | ICD-10-CM | POA: Diagnosis not present

## 2016-04-05 DIAGNOSIS — E1029 Type 1 diabetes mellitus with other diabetic kidney complication: Secondary | ICD-10-CM | POA: Diagnosis not present

## 2016-04-08 DIAGNOSIS — E1029 Type 1 diabetes mellitus with other diabetic kidney complication: Secondary | ICD-10-CM | POA: Diagnosis not present

## 2016-04-08 DIAGNOSIS — N2581 Secondary hyperparathyroidism of renal origin: Secondary | ICD-10-CM | POA: Diagnosis not present

## 2016-04-08 DIAGNOSIS — D689 Coagulation defect, unspecified: Secondary | ICD-10-CM | POA: Diagnosis not present

## 2016-04-08 DIAGNOSIS — D631 Anemia in chronic kidney disease: Secondary | ICD-10-CM | POA: Diagnosis not present

## 2016-04-08 DIAGNOSIS — N186 End stage renal disease: Secondary | ICD-10-CM | POA: Diagnosis not present

## 2016-04-10 DIAGNOSIS — D689 Coagulation defect, unspecified: Secondary | ICD-10-CM | POA: Diagnosis not present

## 2016-04-10 DIAGNOSIS — N2581 Secondary hyperparathyroidism of renal origin: Secondary | ICD-10-CM | POA: Diagnosis not present

## 2016-04-10 DIAGNOSIS — D631 Anemia in chronic kidney disease: Secondary | ICD-10-CM | POA: Diagnosis not present

## 2016-04-10 DIAGNOSIS — E1029 Type 1 diabetes mellitus with other diabetic kidney complication: Secondary | ICD-10-CM | POA: Diagnosis not present

## 2016-04-10 DIAGNOSIS — N186 End stage renal disease: Secondary | ICD-10-CM | POA: Diagnosis not present

## 2016-04-12 DIAGNOSIS — D631 Anemia in chronic kidney disease: Secondary | ICD-10-CM | POA: Diagnosis not present

## 2016-04-12 DIAGNOSIS — E1029 Type 1 diabetes mellitus with other diabetic kidney complication: Secondary | ICD-10-CM | POA: Diagnosis not present

## 2016-04-12 DIAGNOSIS — D689 Coagulation defect, unspecified: Secondary | ICD-10-CM | POA: Diagnosis not present

## 2016-04-12 DIAGNOSIS — N186 End stage renal disease: Secondary | ICD-10-CM | POA: Diagnosis not present

## 2016-04-12 DIAGNOSIS — N2581 Secondary hyperparathyroidism of renal origin: Secondary | ICD-10-CM | POA: Diagnosis not present

## 2016-04-13 DIAGNOSIS — Z992 Dependence on renal dialysis: Secondary | ICD-10-CM | POA: Diagnosis not present

## 2016-04-13 DIAGNOSIS — N186 End stage renal disease: Secondary | ICD-10-CM | POA: Diagnosis not present

## 2016-04-13 DIAGNOSIS — C9000 Multiple myeloma not having achieved remission: Secondary | ICD-10-CM | POA: Diagnosis not present

## 2016-04-15 DIAGNOSIS — E1029 Type 1 diabetes mellitus with other diabetic kidney complication: Secondary | ICD-10-CM | POA: Diagnosis not present

## 2016-04-15 DIAGNOSIS — D631 Anemia in chronic kidney disease: Secondary | ICD-10-CM | POA: Diagnosis not present

## 2016-04-15 DIAGNOSIS — D689 Coagulation defect, unspecified: Secondary | ICD-10-CM | POA: Diagnosis not present

## 2016-04-15 DIAGNOSIS — N2581 Secondary hyperparathyroidism of renal origin: Secondary | ICD-10-CM | POA: Diagnosis not present

## 2016-04-15 DIAGNOSIS — N186 End stage renal disease: Secondary | ICD-10-CM | POA: Diagnosis not present

## 2016-04-17 DIAGNOSIS — E1029 Type 1 diabetes mellitus with other diabetic kidney complication: Secondary | ICD-10-CM | POA: Diagnosis not present

## 2016-04-17 DIAGNOSIS — D631 Anemia in chronic kidney disease: Secondary | ICD-10-CM | POA: Diagnosis not present

## 2016-04-17 DIAGNOSIS — D689 Coagulation defect, unspecified: Secondary | ICD-10-CM | POA: Diagnosis not present

## 2016-04-17 DIAGNOSIS — N186 End stage renal disease: Secondary | ICD-10-CM | POA: Diagnosis not present

## 2016-04-17 DIAGNOSIS — N2581 Secondary hyperparathyroidism of renal origin: Secondary | ICD-10-CM | POA: Diagnosis not present

## 2016-04-19 DIAGNOSIS — D631 Anemia in chronic kidney disease: Secondary | ICD-10-CM | POA: Diagnosis not present

## 2016-04-19 DIAGNOSIS — D689 Coagulation defect, unspecified: Secondary | ICD-10-CM | POA: Diagnosis not present

## 2016-04-19 DIAGNOSIS — N2581 Secondary hyperparathyroidism of renal origin: Secondary | ICD-10-CM | POA: Diagnosis not present

## 2016-04-19 DIAGNOSIS — E1029 Type 1 diabetes mellitus with other diabetic kidney complication: Secondary | ICD-10-CM | POA: Diagnosis not present

## 2016-04-19 DIAGNOSIS — N186 End stage renal disease: Secondary | ICD-10-CM | POA: Diagnosis not present

## 2016-04-22 DIAGNOSIS — D689 Coagulation defect, unspecified: Secondary | ICD-10-CM | POA: Diagnosis not present

## 2016-04-22 DIAGNOSIS — N2581 Secondary hyperparathyroidism of renal origin: Secondary | ICD-10-CM | POA: Diagnosis not present

## 2016-04-22 DIAGNOSIS — N186 End stage renal disease: Secondary | ICD-10-CM | POA: Diagnosis not present

## 2016-04-22 DIAGNOSIS — E1029 Type 1 diabetes mellitus with other diabetic kidney complication: Secondary | ICD-10-CM | POA: Diagnosis not present

## 2016-04-22 DIAGNOSIS — D631 Anemia in chronic kidney disease: Secondary | ICD-10-CM | POA: Diagnosis not present

## 2016-04-24 DIAGNOSIS — D631 Anemia in chronic kidney disease: Secondary | ICD-10-CM | POA: Diagnosis not present

## 2016-04-24 DIAGNOSIS — E1029 Type 1 diabetes mellitus with other diabetic kidney complication: Secondary | ICD-10-CM | POA: Diagnosis not present

## 2016-04-24 DIAGNOSIS — D689 Coagulation defect, unspecified: Secondary | ICD-10-CM | POA: Diagnosis not present

## 2016-04-24 DIAGNOSIS — N186 End stage renal disease: Secondary | ICD-10-CM | POA: Diagnosis not present

## 2016-04-24 DIAGNOSIS — N2581 Secondary hyperparathyroidism of renal origin: Secondary | ICD-10-CM | POA: Diagnosis not present

## 2016-04-25 NOTE — Addendum Note (Signed)
Addended by: Maxcine Ham on: 04/25/2016 09:02 AM   Modules accepted: Orders

## 2016-04-25 NOTE — Progress Notes (Signed)
Order placed in EPIC for gait training for upright walker. Sent to [facility].

## 2016-04-26 DIAGNOSIS — D631 Anemia in chronic kidney disease: Secondary | ICD-10-CM | POA: Diagnosis not present

## 2016-04-26 DIAGNOSIS — D689 Coagulation defect, unspecified: Secondary | ICD-10-CM | POA: Diagnosis not present

## 2016-04-26 DIAGNOSIS — N2581 Secondary hyperparathyroidism of renal origin: Secondary | ICD-10-CM | POA: Diagnosis not present

## 2016-04-26 DIAGNOSIS — E1029 Type 1 diabetes mellitus with other diabetic kidney complication: Secondary | ICD-10-CM | POA: Diagnosis not present

## 2016-04-26 DIAGNOSIS — N186 End stage renal disease: Secondary | ICD-10-CM | POA: Diagnosis not present

## 2016-04-29 DIAGNOSIS — D631 Anemia in chronic kidney disease: Secondary | ICD-10-CM | POA: Diagnosis not present

## 2016-04-29 DIAGNOSIS — E1029 Type 1 diabetes mellitus with other diabetic kidney complication: Secondary | ICD-10-CM | POA: Diagnosis not present

## 2016-04-29 DIAGNOSIS — N2581 Secondary hyperparathyroidism of renal origin: Secondary | ICD-10-CM | POA: Diagnosis not present

## 2016-04-29 DIAGNOSIS — D689 Coagulation defect, unspecified: Secondary | ICD-10-CM | POA: Diagnosis not present

## 2016-04-29 DIAGNOSIS — N186 End stage renal disease: Secondary | ICD-10-CM | POA: Diagnosis not present

## 2016-05-01 DIAGNOSIS — E1029 Type 1 diabetes mellitus with other diabetic kidney complication: Secondary | ICD-10-CM | POA: Diagnosis not present

## 2016-05-01 DIAGNOSIS — N2581 Secondary hyperparathyroidism of renal origin: Secondary | ICD-10-CM | POA: Diagnosis not present

## 2016-05-01 DIAGNOSIS — D689 Coagulation defect, unspecified: Secondary | ICD-10-CM | POA: Diagnosis not present

## 2016-05-01 DIAGNOSIS — D631 Anemia in chronic kidney disease: Secondary | ICD-10-CM | POA: Diagnosis not present

## 2016-05-01 DIAGNOSIS — N186 End stage renal disease: Secondary | ICD-10-CM | POA: Diagnosis not present

## 2016-05-03 DIAGNOSIS — D631 Anemia in chronic kidney disease: Secondary | ICD-10-CM | POA: Diagnosis not present

## 2016-05-03 DIAGNOSIS — E1029 Type 1 diabetes mellitus with other diabetic kidney complication: Secondary | ICD-10-CM | POA: Diagnosis not present

## 2016-05-03 DIAGNOSIS — N2581 Secondary hyperparathyroidism of renal origin: Secondary | ICD-10-CM | POA: Diagnosis not present

## 2016-05-03 DIAGNOSIS — D689 Coagulation defect, unspecified: Secondary | ICD-10-CM | POA: Diagnosis not present

## 2016-05-03 DIAGNOSIS — N186 End stage renal disease: Secondary | ICD-10-CM | POA: Diagnosis not present

## 2016-05-06 DIAGNOSIS — N186 End stage renal disease: Secondary | ICD-10-CM | POA: Diagnosis not present

## 2016-05-06 DIAGNOSIS — E1029 Type 1 diabetes mellitus with other diabetic kidney complication: Secondary | ICD-10-CM | POA: Diagnosis not present

## 2016-05-06 DIAGNOSIS — D631 Anemia in chronic kidney disease: Secondary | ICD-10-CM | POA: Diagnosis not present

## 2016-05-06 DIAGNOSIS — N2581 Secondary hyperparathyroidism of renal origin: Secondary | ICD-10-CM | POA: Diagnosis not present

## 2016-05-06 DIAGNOSIS — D689 Coagulation defect, unspecified: Secondary | ICD-10-CM | POA: Diagnosis not present

## 2016-05-08 DIAGNOSIS — D631 Anemia in chronic kidney disease: Secondary | ICD-10-CM | POA: Diagnosis not present

## 2016-05-08 DIAGNOSIS — E1029 Type 1 diabetes mellitus with other diabetic kidney complication: Secondary | ICD-10-CM | POA: Diagnosis not present

## 2016-05-08 DIAGNOSIS — N186 End stage renal disease: Secondary | ICD-10-CM | POA: Diagnosis not present

## 2016-05-08 DIAGNOSIS — N2581 Secondary hyperparathyroidism of renal origin: Secondary | ICD-10-CM | POA: Diagnosis not present

## 2016-05-08 DIAGNOSIS — D689 Coagulation defect, unspecified: Secondary | ICD-10-CM | POA: Diagnosis not present

## 2016-05-10 DIAGNOSIS — D689 Coagulation defect, unspecified: Secondary | ICD-10-CM | POA: Diagnosis not present

## 2016-05-10 DIAGNOSIS — E1029 Type 1 diabetes mellitus with other diabetic kidney complication: Secondary | ICD-10-CM | POA: Diagnosis not present

## 2016-05-10 DIAGNOSIS — D631 Anemia in chronic kidney disease: Secondary | ICD-10-CM | POA: Diagnosis not present

## 2016-05-10 DIAGNOSIS — N186 End stage renal disease: Secondary | ICD-10-CM | POA: Diagnosis not present

## 2016-05-10 DIAGNOSIS — N2581 Secondary hyperparathyroidism of renal origin: Secondary | ICD-10-CM | POA: Diagnosis not present

## 2016-05-13 DIAGNOSIS — N2581 Secondary hyperparathyroidism of renal origin: Secondary | ICD-10-CM | POA: Diagnosis not present

## 2016-05-13 DIAGNOSIS — N186 End stage renal disease: Secondary | ICD-10-CM | POA: Diagnosis not present

## 2016-05-13 DIAGNOSIS — E1029 Type 1 diabetes mellitus with other diabetic kidney complication: Secondary | ICD-10-CM | POA: Diagnosis not present

## 2016-05-13 DIAGNOSIS — C9000 Multiple myeloma not having achieved remission: Secondary | ICD-10-CM | POA: Diagnosis not present

## 2016-05-13 DIAGNOSIS — Z992 Dependence on renal dialysis: Secondary | ICD-10-CM | POA: Diagnosis not present

## 2016-05-13 DIAGNOSIS — D689 Coagulation defect, unspecified: Secondary | ICD-10-CM | POA: Diagnosis not present

## 2016-05-13 DIAGNOSIS — D631 Anemia in chronic kidney disease: Secondary | ICD-10-CM | POA: Diagnosis not present

## 2016-05-15 DIAGNOSIS — D689 Coagulation defect, unspecified: Secondary | ICD-10-CM | POA: Diagnosis not present

## 2016-05-15 DIAGNOSIS — T82598A Other mechanical complication of other cardiac and vascular devices and implants, initial encounter: Secondary | ICD-10-CM | POA: Diagnosis not present

## 2016-05-15 DIAGNOSIS — E1029 Type 1 diabetes mellitus with other diabetic kidney complication: Secondary | ICD-10-CM | POA: Diagnosis not present

## 2016-05-15 DIAGNOSIS — N2581 Secondary hyperparathyroidism of renal origin: Secondary | ICD-10-CM | POA: Diagnosis not present

## 2016-05-15 DIAGNOSIS — D631 Anemia in chronic kidney disease: Secondary | ICD-10-CM | POA: Diagnosis not present

## 2016-05-15 DIAGNOSIS — N186 End stage renal disease: Secondary | ICD-10-CM | POA: Diagnosis not present

## 2016-05-17 DIAGNOSIS — T82598A Other mechanical complication of other cardiac and vascular devices and implants, initial encounter: Secondary | ICD-10-CM | POA: Diagnosis not present

## 2016-05-17 DIAGNOSIS — N186 End stage renal disease: Secondary | ICD-10-CM | POA: Diagnosis not present

## 2016-05-17 DIAGNOSIS — E1029 Type 1 diabetes mellitus with other diabetic kidney complication: Secondary | ICD-10-CM | POA: Diagnosis not present

## 2016-05-17 DIAGNOSIS — D631 Anemia in chronic kidney disease: Secondary | ICD-10-CM | POA: Diagnosis not present

## 2016-05-17 DIAGNOSIS — N2581 Secondary hyperparathyroidism of renal origin: Secondary | ICD-10-CM | POA: Diagnosis not present

## 2016-05-17 DIAGNOSIS — D689 Coagulation defect, unspecified: Secondary | ICD-10-CM | POA: Diagnosis not present

## 2016-05-20 DIAGNOSIS — N2581 Secondary hyperparathyroidism of renal origin: Secondary | ICD-10-CM | POA: Diagnosis not present

## 2016-05-20 DIAGNOSIS — E1029 Type 1 diabetes mellitus with other diabetic kidney complication: Secondary | ICD-10-CM | POA: Diagnosis not present

## 2016-05-20 DIAGNOSIS — D631 Anemia in chronic kidney disease: Secondary | ICD-10-CM | POA: Diagnosis not present

## 2016-05-20 DIAGNOSIS — D689 Coagulation defect, unspecified: Secondary | ICD-10-CM | POA: Diagnosis not present

## 2016-05-20 DIAGNOSIS — N186 End stage renal disease: Secondary | ICD-10-CM | POA: Diagnosis not present

## 2016-05-20 DIAGNOSIS — T82598A Other mechanical complication of other cardiac and vascular devices and implants, initial encounter: Secondary | ICD-10-CM | POA: Diagnosis not present

## 2016-05-22 ENCOUNTER — Telehealth: Payer: Self-pay | Admitting: Family Medicine

## 2016-05-22 DIAGNOSIS — N186 End stage renal disease: Secondary | ICD-10-CM | POA: Diagnosis not present

## 2016-05-22 DIAGNOSIS — N2581 Secondary hyperparathyroidism of renal origin: Secondary | ICD-10-CM | POA: Diagnosis not present

## 2016-05-22 DIAGNOSIS — D689 Coagulation defect, unspecified: Secondary | ICD-10-CM | POA: Diagnosis not present

## 2016-05-22 DIAGNOSIS — E1029 Type 1 diabetes mellitus with other diabetic kidney complication: Secondary | ICD-10-CM | POA: Diagnosis not present

## 2016-05-22 DIAGNOSIS — D631 Anemia in chronic kidney disease: Secondary | ICD-10-CM | POA: Diagnosis not present

## 2016-05-22 DIAGNOSIS — T82598A Other mechanical complication of other cardiac and vascular devices and implants, initial encounter: Secondary | ICD-10-CM | POA: Diagnosis not present

## 2016-05-22 NOTE — Telephone Encounter (Signed)
° ° °  Pt has been taking a whole pill instead of a half pill and now the pharmacy will not refill because it is not due according to the rx. Pt call to ask for a refill saying he takes 1 tablet a day and pt also said he need a new gluclose  Monitor   Pt request refill of the following:  glipiZIDE (GLUCOTROL) 5 MG tablet   Phamacy:  [place]

## 2016-05-22 NOTE — Telephone Encounter (Signed)
Patient is still taking 5 mg a day, okay to send new Rx reflecting this?

## 2016-05-22 NOTE — Telephone Encounter (Signed)
He is supposed to be taking Glipizide 2.5 mg (1/2) tab because he was having episodes of hypoglycemia, he was snacking at bedtime to prevent episodes.  I sent message through My Chart after lab results received. Goal of HgA1C < 8.0 and it was 7.0. So continue Glipizide 5 mg 1/2 tab daily and sugar free diet.  Thanks, BJ

## 2016-05-23 NOTE — Telephone Encounter (Signed)
Spoke to patient, confirmed that patient started taking his Glipizide 1/2 tablet this morning, spoke with patients pharmacy stated that they have enough refills on his Glipizide. Patient aware and verbalized understanding.

## 2016-05-24 DIAGNOSIS — N186 End stage renal disease: Secondary | ICD-10-CM | POA: Diagnosis not present

## 2016-05-24 DIAGNOSIS — D631 Anemia in chronic kidney disease: Secondary | ICD-10-CM | POA: Diagnosis not present

## 2016-05-24 DIAGNOSIS — T82598A Other mechanical complication of other cardiac and vascular devices and implants, initial encounter: Secondary | ICD-10-CM | POA: Diagnosis not present

## 2016-05-24 DIAGNOSIS — D689 Coagulation defect, unspecified: Secondary | ICD-10-CM | POA: Diagnosis not present

## 2016-05-24 DIAGNOSIS — E1029 Type 1 diabetes mellitus with other diabetic kidney complication: Secondary | ICD-10-CM | POA: Diagnosis not present

## 2016-05-24 DIAGNOSIS — N2581 Secondary hyperparathyroidism of renal origin: Secondary | ICD-10-CM | POA: Diagnosis not present

## 2016-05-27 DIAGNOSIS — N186 End stage renal disease: Secondary | ICD-10-CM | POA: Diagnosis not present

## 2016-05-27 DIAGNOSIS — T82598A Other mechanical complication of other cardiac and vascular devices and implants, initial encounter: Secondary | ICD-10-CM | POA: Diagnosis not present

## 2016-05-27 DIAGNOSIS — D631 Anemia in chronic kidney disease: Secondary | ICD-10-CM | POA: Diagnosis not present

## 2016-05-27 DIAGNOSIS — N2581 Secondary hyperparathyroidism of renal origin: Secondary | ICD-10-CM | POA: Diagnosis not present

## 2016-05-27 DIAGNOSIS — D689 Coagulation defect, unspecified: Secondary | ICD-10-CM | POA: Diagnosis not present

## 2016-05-27 DIAGNOSIS — E1029 Type 1 diabetes mellitus with other diabetic kidney complication: Secondary | ICD-10-CM | POA: Diagnosis not present

## 2016-05-28 ENCOUNTER — Encounter: Payer: Self-pay | Admitting: Physical Therapy

## 2016-05-28 ENCOUNTER — Ambulatory Visit: Payer: Medicare Other | Attending: Orthopedic Surgery | Admitting: Physical Therapy

## 2016-05-28 DIAGNOSIS — R2681 Unsteadiness on feet: Secondary | ICD-10-CM | POA: Diagnosis not present

## 2016-05-28 DIAGNOSIS — M6281 Muscle weakness (generalized): Secondary | ICD-10-CM | POA: Insufficient documentation

## 2016-05-28 DIAGNOSIS — M6249 Contracture of muscle, multiple sites: Secondary | ICD-10-CM | POA: Diagnosis not present

## 2016-05-28 DIAGNOSIS — R2689 Other abnormalities of gait and mobility: Secondary | ICD-10-CM | POA: Insufficient documentation

## 2016-05-28 NOTE — Therapy (Signed)
[facility] [address] [place], [place], [address] Phone: [phone]   Fax:  [phone]  Physical Therapy Evaluation  Patient Details  Name: Jack [name] MRN: [id number] Date of Birth: 07-19-1945 Referring Provider: Meridee Score MD   Encounter Date: 05/28/2016      PT End of Session - 05/28/16 1054    Visit Number 1   Number of Visits 18   Date for PT Re-Evaluation 07/26/16   Authorization Type Medicare & G-codes every 10th visit    PT Start Time 0845   PT Stop Time 0930   PT Time Calculation (min) 45 min   Equipment Utilized During Treatment Gait belt   Activity Tolerance Patient tolerated treatment well   Behavior During Therapy [facility] for tasks assessed/performed      Past Medical History:  Diagnosis Date  . Anemia   . Asthma   . Contracture of joint    left knee  . Decubitus ulcer of sacral region, stage 2 03/07/2014  . Diabetes mellitus with peripheral vascular disease ([place])   . End-stage renal disease on hemodialysis [facility])    Started HD March 2014.  Cause of ESRD was DM.  Gets HD at [facility] on [place] on MWF schedule.   Marland Kitchen ESRD on hemodialysis ([place])   . Gangrene ([place])    right BKA  . Gangrene of foot ([place])   . Glaucoma   . Glaucoma 03/07/2014  . Heart murmur   . History of MRSA infection 04/22/2013   Bacteremia assoc w L foot wound infection Mar 2015   . Hyperparathyroidism, secondary renal ([place])   . Hypertension   . Hypothyroidism   . MRSA bacteremia   . Multiple myeloma, without mention of having achieved remission 03/30/2012   Cytogenetic neg on 03/23/2012.  Marland Kitchen Peripheral arterial disease ([place])   . Peripheral vascular disease, unspecified ([place]) 11/19/2012   In the past had R foot toe amps then R TMA. In 2015 had left foot toe amp > then TMA >then L BKA on 05/14/13   . Pneumonia    2012  . Thyroid disease    hyperparathyroidism    Past Surgical History:  Procedure Laterality Date   . ABDOMINAL AORTAGRAM Bilateral 11/06/2012   Procedure: ABDOMINAL AORTAGRAM;  Surgeon: Elam Dutch, MD;  Location: [facility] CATH LAB;  Service: Cardiovascular;  Laterality: Bilateral;  . AMPUTATION Right 11/10/2012   Procedure: AMPUTATION FIRST and SECOND TOES Right Foot;  Surgeon: Elam Dutch, MD;  Location: [place];  Service: Vascular;  Laterality: Right;  . AMPUTATION Left 04/07/2013   Procedure: AMPUTATION DIGIT- LEFT 1ST TOE;  Surgeon: Mal Misty, MD;  Location: [place];  Service: Vascular;  Laterality: Left;  . AMPUTATION Left 04/26/2013   Procedure: Left Foot Transmetatarsal Amputation;  Surgeon: Newt Minion, MD;  Location: [place];  Service: Orthopedics;  Laterality: Left;  . AMPUTATION Left 05/14/2013   Procedure: AMPUTATION BELOW KNEE;  Surgeon: Newt Minion, MD;  Location: [place];  Service: Orthopedics;  Laterality: Left;  Left Below Knee Amputation  . AMPUTATION Left 07/23/2013   Procedure: AMPUTATION BELOW KNEE;  Surgeon: Newt Minion, MD;  Location: [place];  Service: Orthopedics;  Laterality: Left;  Left Below Knee Amputation Revision  . AMPUTATION Right 02/18/2014   Procedure: AMPUTATION BELOW KNEE;  Surgeon: Newt Minion, MD;  Location: [place];  Service: Orthopedics;  Laterality: Right;  . AV FISTULA PLACEMENT Left 03/25/2012   Procedure: ARTERIOVENOUS (AV)  FISTULA CREATION;  Surgeon: Mal Misty, MD;  Location: [place];  Service: Vascular;  Laterality: Left;  . BELOW KNEE LEG AMPUTATION Left 05/14/2013   DR [name]  . BELOW KNEE LEG AMPUTATION Right 02/18/2014   dr duda  . CARDIAC CATHETERIZATION     approx 30 years ago  . CERVICAL DISC SURGERY    . EYE SURGERY Bilateral    CATARACTS  . I&D EXTREMITY Left 04/22/2013   Procedure: IRRIGATION AND DEBRIDEMENT LEFT FIRST TOE AMPUTATION WOUND ;  Surgeon: Mal Misty, MD;  Location: [place];  Service: Vascular;  Laterality: Left;  . I&D EXTREMITY Right 01/05/2014   Procedure: IRRIGATION AND DEBRIDEMENT Right Heel Ulcer;  Surgeon:  Mcarthur Rossetti, MD;  Location: [place];  Service: Orthopedics;  Laterality: Right;  Surgeon Available after 5PM  . INSERTION OF DIALYSIS CATHETER Right 03/19/2012   Procedure: INSERTION OF DIALYSIS CATHETER;  Surgeon: Mal Misty, MD;  Location: [place];  Service: Vascular;  Laterality: Right;  Right Internal Jugular  . LIGATION OF COMPETING BRANCHES OF ARTERIOVENOUS FISTULA Left 05/08/2012   Procedure: LIGATION OF COMPETING BRANCHES OF ARTERIOVENOUS FISTULA;  Surgeon: Mal Misty, MD;  Location: [place];  Service: Vascular;  Laterality: Left;  Ultrasound guided  . REPAIR QUADRICEPS/HAMSTRING MUSCLES Left 05/20/2014   Procedure: Left Hamstring Release;  Surgeon: Newt Minion, MD;  Location: [place];  Service: Orthopedics;  Laterality: Left;  . REVISON OF ARTERIOVENOUS FISTULA Left 01/30/2016   Procedure: CEPHALIC VEIN TURNDOWN TO LEFT UPPER ARM;  Surgeon: Elam Dutch, MD;  Location: [place];  Service: Vascular;  Laterality: Left;  . STUMP REVISION Right 05/20/2014   Procedure: Revision Right Below Knee Amputation;  Surgeon: Newt Minion, MD;  Location: [place];  Service: Orthopedics;  Laterality: Right;  . TEE WITHOUT CARDIOVERSION N/A 04/20/2013   Procedure: TRANSESOPHAGEAL ECHOCARDIOGRAM (TEE);  Surgeon: Josue Hector, MD;  Location: [facility] ENDOSCOPY;  Service: Cardiovascular;  Laterality: N/A;  . THYROIDECTOMY    . TOE AMPUTATION     D/C 04-30-13  . TRANSMETATARSAL AMPUTATION Left 12/16/2012   Procedure: TRANSMETATARSAL AMPUTATION AND VAC PLACEMENT;  Surgeon: Elam Dutch, MD;  Location: [place];  Service: Vascular;  Laterality: Left;    There were no vitals filed for this visit.       Subjective Assessment - 05/28/16 0849    Subjective Patient is a 71 year old male presenting to OPPT neuro in a manual wheelchair with bilateral transtibial prostheses donned with his daughter Jack [name]). Patient presents to PT for an evaluation due to a referral from Jack Score MD to assess patient's prosthetic  gait with various assistive devices including the upright walker. Patient's L transtibial amputation was on 05/14/2013 (revised 07/23/2013), and R transtibial amputation was on 02/18/2014 (revised on 05/20/2014). Patient received both of his prostheses on 08/11/2014, which are the prostheses he is utilizing today. Patient is currently utilizing a RW for mobility within his home, and a manual wheelchair when in the community. Patient reports he is going to the [facility] approximately 3x/week and walking 1/10 of a mile with RW and assistance from a trainer. Patient would like to decrease his dependence on aid from family members and increase his independence with a proper assistive device.   Patient is accompained by: Family member  daughter Jack [name]    Pertinent History bilateral transtibial amputations, anemia, asthma, contracture of L knee, decubitus ulcer of sacral region, DM with PVD, ESRD, glaucoma, heart murmur, HTN, hypothyroidism,  multiple myeloma, PAD   Limitations Lifting;Standing;Walking;House hold activities   Patient Stated Goals walk without a person assisting him (walk with device only)    Currently in Pain? No/denies            [facility] PT Assessment - 05/28/16 0845      Assessment   Medical Diagnosis bilateral transtibial amputation    Referring Provider Jack Score MD    Onset Date/Surgical Date 04/25/16  PT referral to try Upright Walker    Hand Dominance Right     Precautions   Precautions Other (comment)   Precaution Comments No BP on LUE due to dialysis fistula      Balance Screen   Has the patient fallen in the past 6 months No   Has the patient had a decrease in activity level because of a fear of falling?  No   Is the patient reluctant to leave their home because of a fear of falling?  No     Home Environment   Living Environment Private residence   Living Arrangements Children  daughter Jack [name]    Type of [place] entrance  approximately 20 feet  long; steep ramp    Home Layout One level   [place] - 2 wheels;Cane - single point;Crutches;Shower seat;Grab bars - tub/shower;Wheelchair - manual  standard rollator      Prior Function   Level of Independence Independent with household mobility with device  with RW; to leave the house utilizes manual wheelchair     Observation/Other Assessments   Focus on Therapeutic Outcomes (FOTO)  36.53   Activities of Balance Confidence Scale (ABC Scale)  40.63   Fear Avoidance Belief Questionnaire (FABQ)  49 (13)      ROM / Strength   AROM / PROM / Strength PROM;Strength     PROM   PROM Assessment Site Knee;Hip   Right/Left Hip Right;Left   Left Hip Extension -18   Right/Left Knee Right;Left   Right Knee Extension -15  measured in supine position    Left Knee Extension -25  measured in supine position      Strength   Overall Strength Deficits   Overall Strength Comments gross deficits noted in bilateral hip flexion (approximately 4-/5). Bilateral knee extension 5/5 within available range.    Strength Assessment Site Hip;Knee   Right/Left Hip Right;Left   Right Hip ABduction 3+/5   Left Hip ABduction 3+/5   Right/Left Knee Right;Left   Right Knee Extension 5/5  within available PROM     Right Hip   Right Hip Extension -17     Transfers   Transfers Sit to Stand;Stand to Sit;Stand Pivot Transfers   Sit to Stand 6: Modified independent (Device/Increase time);With upper extremity assist;With armrests;From chair/3-in-1  requires BUE support on RW    Stand to Sit 6: Modified independent (Device/Increase time);With upper extremity assist;With armrests;To chair/3-in-1  requires BUE support on RW    Stand Pivot Transfers 6: Modified independent (Device/Increase time);5: Supervision   Stand Pivot Transfer Details (indicate cue type and reason) stand pivot transfer is supervision with manual wheelchair's armrest in place; mod I when armrest is removed     Ambulation/Gait    Ambulation/Gait Yes   Ambulation/Gait Assistance 5: Supervision;4: Min assist   Ambulation/Gait Assistance Details patient requires supervision with RW, and min A with upright walker. patient required demonstration and cueing for proper technique with upright walker.   Ambulation Distance (Feet) 115 Feet  115 with upright walker; 15 with RW    Assistive device Rolling walker;Prostheses;Other (Comment)  upright walker    Gait Pattern Step-through pattern;Decreased step length - right;Decreased step length - left;Decreased stride length;Right flexed knee in stance;Left flexed knee in stance;Trunk flexed;Wide base of support;Poor foot clearance - left;Poor foot clearance - right   Ambulation Surface Level;Indoor     Balance   Balance Assessed Yes   Balance comment Patient required unilateral support on RW in order to maintain static balance with wide BOS (feet shoulder width apart). When patient stood without UE support, PT required to provide max A to prevent fall. PT required to provide close supervision when patient utilized unilateral support on RW.     Static Standing Balance   Static Standing - Balance Support Right upper extremity supported;Left upper extremity supported   Static Standing - Level of Assistance 2: Max assist;Other (comment)  supervision    Static Standing - Comment/# of Minutes 30 seconds   with LUE support and RUE support on RW      Dynamic Standing Balance   Dynamic Standing - Balance Support Right upper extremity supported;Left upper extremity supported  Support on RW   Dynamic Standing - Level of Assistance 2: Max assist;Other (comment)  supervision     Dynamic Standing - Balance Activities Forward lean/weight shifting;Reaching for objects   Dynamic Standing - Comments Patient demonstrated ability to reach anteriorly approximately 3 inches and to reach inferiorly to his knee without LOB, but required close supervision from PT. When patient attempted to reach beyond  these limits, PT provided max A to prevent fall.         Prosthetics Assessment - 05/28/16 0845      Prosthetics   Prosthetic Care Independent with Skin check;Residual limb care;Care of non-amputated limb;Prosthetic cleaning;Ply sock cleaning;Proper wear schedule/adjustment;Proper weight-bearing schedule/adjustment   Prosthetic Care Dependent with Correct ply sock adjustment   Prosthetic Care Comments  Patient presents to PT evaluation wihtout any socks. Patient reports mild discomfort at distal end of L residual limb due to end bearing.   Donning prosthesis  Independent   Doffing prosthesis  Independent   Current prosthetic wear tolerance (days/week)  daily    Current prosthetic wear tolerance (#hours/day)  all awake hours every day   Edema None    Residual limb condition  R residual limb and L residual limb: darkening of color along incision line and along distal tibia. Normal temperature, moisture, and hair growth.   K code/activity level with prosthetic use  K2                          PT Education - 05/28/16 1053    Education provided Yes   Education Details plan of care; contact prosthetist Gerald Stabs) to schedule appointment    Person(s) Educated Patient;Child(ren)  daughter Jack [name]    Methods Explanation   Comprehension Verbalized understanding          PT Short Term Goals - 05/28/16 1300      PT SHORT TERM GOAL #1   Title Patient will verbalize understanding and return demonstration of initial HEP. (TARGET DATE: 06/28/2016)    Time 1   Period Months   Status New     PT SHORT TERM GOAL #2   Title Patient will demonstrate ability to reach 5 inches anteriorly and reach within 10 inches from the floor unilateral UE support on RW with supervision to indicate a decrease in his  risk of falling. (TARGET DATE: 06/28/2016)    Time 1   Period Months   Status New     PT SHORT TERM GOAL #3   Title PT will perform TUG, and short term goal will be set. (TARGET  DATE: 06/28/2016)    Time 1   Period Months   Status New     PT SHORT TERM GOAL #4   Title Patient will demonstrate ability to ambulate 150 feet on indoor surfaces with rollator style walker and supervision to indicate a decrease in his risk of falling. (TARGET DATE: 06/28/2016)   Time 1   Period Months   Status New           PT Long Term Goals - 05/28/16 1249      PT LONG TERM GOAL #1   Title Patient will verbalize understanding and return demonstration of ongoing HEP program. (TARGET DATE: 07/26/2016)    Time 2   Period Months   Status New     PT LONG TERM GOAL #2   Title Patient will ambulate 500 feet on firm, paved outdoor surfaces with family support to indicate a decrease risk of falling when returning to community ambulation with family support. (TARGET DATE: 07/26/2016)    Time 2   Period Months   Status New     PT LONG TERM GOAL #3   Title Patient will be able to ambulate 100 feet with rollator style walker with mod I to indicate a decrease in his risk of falling while ambulating within his house. (TARGET DATE: 07/26/2016)    Time 2   Period Months   Status New     PT LONG TERM GOAL #4   Title Patient will be able to ambulate over ramp/curb with rollator style walker with family assistance to indicate a decrease in his risk of falling when returning to community ambulation with family. (TARGET DATE: 07/26/2016)    Time 2   Period Months   Status New     PT LONG TERM GOAL #5   Title Patient will demonstrate ability to reach 8 inches anteriorly and reach within 8 inches from the floor unilateral UE support on RW with supervision to indicate a decrease in his risk of falling. (TARGET DATE: 06/28/2016)    Time 2   Period Months   Status New               Plan - 05/28/16 1245    Clinical Impression Statement Patient is a 71 year old male presenting to OPPT neuro for an evolving patient presentation for a moderate complexity PT evaluation. The patient's L  transtibial amputation was on 05/14/2013 (revised 07/23/2013), and R transtibial amputation was on 02/18/2014 (revised on 05/20/2014). Patient received both of his prostheses on 08/11/2014, which are the prostheses he is utilizing today. The following deficits were noted during the patient's exam: decreased range of motion limiting patient's functional mobility and placing him at an increased risk for falls, decreased strength in bilateral lower extremities placing him at an increased risk for falls, and limited in ambulation to short, level, indoor surfaces with RW, which is greatly limiting the patient's ability to participate in community activities with family. PT will assess effectiveness and safety of various rollator style walkers to address patient's functional mobility deficit. The patient's inability to maintain static stance without UE support indicate he is at a higher risk for repeated falls. Patient would benefit from skilled PT to address these impairments and functional limitations  to maximize functional mobility independence and reduce falls risk.   Rehab Potential Good   Clinical Impairments Affecting Rehab Potential bilateral transtibial amputations, anemia, asthma, contracture of L knee, decubitus ulcer of sacral region, DM with PVD, ESRD, glaucoma, heart murmur, HTN, hypothyroidism, multiple myeloma, PAD   PT Frequency 2x / week   PT Duration Other (comment)  9 weeks    PT Treatment/Interventions ADLs/Self Care Home Management;Neuromuscular re-education;Balance training;Therapeutic exercise;Therapeutic activities;Functional mobility training;Stair training;Gait training;DME Instruction;Patient/family education;Prosthetic Training;Manual techniques;Scar mobilization;Passive range of motion   PT Next Visit Plan initiate HEP for LE flexibility, assess prosthetic gait with rollator versus upright walker (patient currently owns rollator)    Consulted and Agree with Plan of Care Patient;Family  member/caregiver  daughter Jack [name]       Patient will benefit from skilled therapeutic intervention in order to improve the following deficits and impairments:  Abnormal gait, Decreased activity tolerance, Decreased balance, Decreased coordination, Decreased range of motion, Decreased safety awareness, Decreased knowledge of use of DME, Decreased knowledge of precautions, Decreased endurance, Decreased strength, Difficulty walking, Prosthetic Dependency, Postural dysfunction  Visit Diagnosis: Unsteadiness on feet  Other abnormalities of gait and mobility  Muscle weakness (generalized)  Contracture of muscle, multiple sites      G-Codes - 06/09/2016 1305    Functional Assessment Tool Used (Outpatient Only) Patient unble to maintain balance in static stance without unilateral UE support on RW.    Functional Limitation Changing and maintaining body position   Changing and Maintaining Body Position Current Status [phone]) At least 60 percent but less than 80 percent impaired, limited or restricted   Changing and Maintaining Body Position Goal Status (L4650) At least 40 percent but less than 60 percent impaired, limited or restricted       Problem List Patient Active Problem List   Diagnosis Date Noted  . Dyslipidemia 11/21/2015  . Diabetic retinopathy ([place])- left eye 10/02/2015  . Complications, amputation stump late ([place]) 05/20/2014  . Weakness generalized 04/07/2014  . Sepsis ([place]) 04/07/2014  . Decubitus ulcer, stage II 04/07/2014  . Decubitus ulcer of ankle   . Fatigue   . Wound infection 04/06/2014  . Decubitus ulcer of sacral region, stage 2 03/07/2014  . Glaucoma 03/07/2014  . Gout 03/07/2014  . Below knee amputation status ([place]) 02/18/2014  . Osteomyelitis ([place]) 01/04/2014  . Phantom limb ([place]) 12/14/2013  . Type 2 diabetes mellitus with diabetic foot infection ([place])   . Diabetes mellitus with peripheral vascular disease ([place])   . Asthma 05/17/2013  . History of MRSA  infection 04/22/2013  . Peripheral vascular disease ([place]) 11/19/2012  . End-stage renal disease on hemodialysis ([place]) 05/05/2012  . Kahler disease ([place]) 03/30/2012  . Anemia in chronic kidney disease 03/19/2012  . Hypothyroidism following radioiodine therapy 03/19/2012  . Anemia, iron deficiency 03/19/2012  . Hypertension 03/18/2012  . Chronic kidney disease (CKD), stage V ([place]) 03/18/2012  . Cardiac conduction disorder 03/18/2012  . MGUS (monoclonal gammopathy of unknown significance) 02/28/2011  . Monoclonal paraproteinemia 02/28/2011    Arelia Sneddon, SPT  05/29/2016, 9:50 AM  Jamey Reas, PT, DPT PT Specializing in [place] 05/29/16 9:57 AM Phone:  [phone]  Fax:  [phone] [facility] [address] [place], [place] [address]  [facility] [address] [place], [place], [address] Phone: [phone]   Fax:  [phone]  Name: Jack [name] MRN: [id number] Date of Birth: 06-11-1945

## 2016-05-29 DIAGNOSIS — N186 End stage renal disease: Secondary | ICD-10-CM | POA: Diagnosis not present

## 2016-05-29 DIAGNOSIS — N2581 Secondary hyperparathyroidism of renal origin: Secondary | ICD-10-CM | POA: Diagnosis not present

## 2016-05-29 DIAGNOSIS — D631 Anemia in chronic kidney disease: Secondary | ICD-10-CM | POA: Diagnosis not present

## 2016-05-29 DIAGNOSIS — T82598A Other mechanical complication of other cardiac and vascular devices and implants, initial encounter: Secondary | ICD-10-CM | POA: Diagnosis not present

## 2016-05-29 DIAGNOSIS — D689 Coagulation defect, unspecified: Secondary | ICD-10-CM | POA: Diagnosis not present

## 2016-05-29 DIAGNOSIS — E1029 Type 1 diabetes mellitus with other diabetic kidney complication: Secondary | ICD-10-CM | POA: Diagnosis not present

## 2016-05-30 ENCOUNTER — Encounter: Payer: Self-pay | Admitting: Physical Therapy

## 2016-05-30 ENCOUNTER — Ambulatory Visit: Payer: Medicare Other | Admitting: Physical Therapy

## 2016-05-30 DIAGNOSIS — M6281 Muscle weakness (generalized): Secondary | ICD-10-CM | POA: Diagnosis not present

## 2016-05-30 DIAGNOSIS — M6249 Contracture of muscle, multiple sites: Secondary | ICD-10-CM | POA: Diagnosis not present

## 2016-05-30 DIAGNOSIS — R2689 Other abnormalities of gait and mobility: Secondary | ICD-10-CM | POA: Diagnosis not present

## 2016-05-30 DIAGNOSIS — R2681 Unsteadiness on feet: Secondary | ICD-10-CM | POA: Diagnosis not present

## 2016-05-30 NOTE — Patient Instructions (Addendum)
Hamstring Stretch (Sitting)    Sitting, extend one leg (can be on bed as shown or seated in chair with foot propped on another surface) and place hands on same thigh for support. Keeping torso straight, lean forward, sliding hands down leg, until a stretch is felt in back of thigh. Hold __60__ seconds. Repeat with other leg. 3 reps each leg. 1-2 times a day.  Copyright  VHI. All rights reserved.   Bridge    Lie back, legs bent.lift hips [place] as high as you can. Hold for 3-5 seconds. 10 reps.  Perform _1-2_ sessions per day.  http://pm.exer.us/54   Copyright  VHI. All rights reserved.  Lower Trunk Rotation Stretch    Keeping back flat and feet together, rotate knees to left side. Hold 30 seconds. Then rotate knees toward other side. Hold for 30 seconds. Repeat __4__ times toward each side. Do _1_ sets per session. Do _1-2_ sessions per day.  http://orth.exer.us/122   Copyright  VHI. All rights reserved.    Hip Flexor Stretch    Lying on back near edge of bed, bend one leg, foot flat. Hang other leg over edge, relaxed, thigh resting entirely on bed for _1___ minutes. Repeat __3_ times each leg. Do _1-2___ sessions per day.  http://gt2.exer.us/346   Copyright  VHI. All rights reserved.

## 2016-05-31 DIAGNOSIS — N186 End stage renal disease: Secondary | ICD-10-CM | POA: Diagnosis not present

## 2016-05-31 DIAGNOSIS — D631 Anemia in chronic kidney disease: Secondary | ICD-10-CM | POA: Diagnosis not present

## 2016-05-31 DIAGNOSIS — D689 Coagulation defect, unspecified: Secondary | ICD-10-CM | POA: Diagnosis not present

## 2016-05-31 DIAGNOSIS — T82598A Other mechanical complication of other cardiac and vascular devices and implants, initial encounter: Secondary | ICD-10-CM | POA: Diagnosis not present

## 2016-05-31 DIAGNOSIS — E1029 Type 1 diabetes mellitus with other diabetic kidney complication: Secondary | ICD-10-CM | POA: Diagnosis not present

## 2016-05-31 DIAGNOSIS — N2581 Secondary hyperparathyroidism of renal origin: Secondary | ICD-10-CM | POA: Diagnosis not present

## 2016-05-31 NOTE — Therapy (Signed)
[facility] [address] [place], [place], [address] Phone: [phone]   Fax:  [phone]  Physical Therapy Treatment  Patient Details  Name: Jack [name] MRN: [id number] Date of Birth: 11/16/1945 Referring Provider: Meridee Score MD   Encounter Date: 05/30/2016   05/30/16 0808  PT Visits / Re-Eval  Visit Number 2  Number of Visits 18  Date for PT Re-Evaluation 07/26/16  Authorization  Authorization Type Medicare & G-codes every 10th visit   PT Time Calculation  PT Start Time 0805  PT Stop Time 0845  PT Time Calculation (min) 40 min  PT - End of Session  Equipment Utilized During Treatment Gait belt  Activity Tolerance Patient tolerated treatment well  Behavior During Therapy [facility] for tasks assessed/performed     Past Medical History:  Diagnosis Date  . Anemia   . Asthma   . Contracture of joint    left knee  . Decubitus ulcer of sacral region, stage 2 03/07/2014  . Diabetes mellitus with peripheral vascular disease ([place])   . End-stage renal disease on hemodialysis [facility])    Started HD March 2014.  Cause of ESRD was DM.  Gets HD at [facility] on [address] on MWF schedule.   Marland Kitchen ESRD on hemodialysis ([place])   . Gangrene ([place])    right BKA  . Gangrene of foot ([place])   . Glaucoma   . Glaucoma 03/07/2014  . Heart murmur   . History of MRSA infection 04/22/2013   Bacteremia assoc w L foot wound infection Mar 2015   . Hyperparathyroidism, secondary renal ([place])   . Hypertension   . Hypothyroidism   . MRSA bacteremia   . Multiple myeloma, without mention of having achieved remission 03/30/2012   Cytogenetic neg on 03/23/2012.  Marland Kitchen Peripheral arterial disease ([place])   . Peripheral vascular disease, unspecified ([place]) 11/19/2012   In the past had R foot toe amps then R TMA. In 2015 had left foot toe amp > then TMA >then L BKA on 05/14/13   . Pneumonia    2012  . Thyroid disease    hyperparathyroidism    Past  Surgical History:  Procedure Laterality Date  . ABDOMINAL AORTAGRAM Bilateral 11/06/2012   Procedure: ABDOMINAL AORTAGRAM;  Surgeon: Elam Dutch, MD;  Location: [facility] CATH LAB;  Service: Cardiovascular;  Laterality: Bilateral;  . AMPUTATION Right 11/10/2012   Procedure: AMPUTATION FIRST and SECOND TOES Right Foot;  Surgeon: Elam Dutch, MD;  Location: [place];  Service: Vascular;  Laterality: Right;  . AMPUTATION Left 04/07/2013   Procedure: AMPUTATION DIGIT- LEFT 1ST TOE;  Surgeon: Mal Misty, MD;  Location: [place];  Service: Vascular;  Laterality: Left;  . AMPUTATION Left 04/26/2013   Procedure: Left Foot Transmetatarsal Amputation;  Surgeon: Newt Minion, MD;  Location: [place];  Service: Orthopedics;  Laterality: Left;  . AMPUTATION Left 05/14/2013   Procedure: AMPUTATION BELOW KNEE;  Surgeon: Newt Minion, MD;  Location: [place];  Service: Orthopedics;  Laterality: Left;  Left Below Knee Amputation  . AMPUTATION Left 07/23/2013   Procedure: AMPUTATION BELOW KNEE;  Surgeon: Newt Minion, MD;  Location: [place];  Service: Orthopedics;  Laterality: Left;  Left Below Knee Amputation Revision  . AMPUTATION Right 02/18/2014   Procedure: AMPUTATION BELOW KNEE;  Surgeon: Newt Minion, MD;  Location: [place];  Service: Orthopedics;  Laterality: Right;  . AV FISTULA PLACEMENT Left 03/25/2012   Procedure: ARTERIOVENOUS (AV) FISTULA CREATION;  Surgeon: Mal Misty, MD;  Location: [place];  Service: Vascular;  Laterality: Left;  . BELOW KNEE LEG AMPUTATION Left 05/14/2013   DR [name]  . BELOW KNEE LEG AMPUTATION Right 02/18/2014   dr duda  . CARDIAC CATHETERIZATION     approx 30 years ago  . CERVICAL DISC SURGERY    . EYE SURGERY Bilateral    CATARACTS  . I&D EXTREMITY Left 04/22/2013   Procedure: IRRIGATION AND DEBRIDEMENT LEFT FIRST TOE AMPUTATION WOUND ;  Surgeon: Mal Misty, MD;  Location: [place];  Service: Vascular;  Laterality: Left;  . I&D EXTREMITY Right 01/05/2014   Procedure: IRRIGATION  AND DEBRIDEMENT Right Heel Ulcer;  Surgeon: Mcarthur Rossetti, MD;  Location: [facility];  Service: Orthopedics;  Laterality: Right;  Surgeon Available after 5PM  . INSERTION OF DIALYSIS CATHETER Right 03/19/2012   Procedure: INSERTION OF DIALYSIS CATHETER;  Surgeon: Mal Misty, MD;  Location: [place];  Service: Vascular;  Laterality: Right;  Right Internal Jugular  . LIGATION OF COMPETING BRANCHES OF ARTERIOVENOUS FISTULA Left 05/08/2012   Procedure: LIGATION OF COMPETING BRANCHES OF ARTERIOVENOUS FISTULA;  Surgeon: Mal Misty, MD;  Location: [place];  Service: Vascular;  Laterality: Left;  Ultrasound guided  . REPAIR QUADRICEPS/HAMSTRING MUSCLES Left 05/20/2014   Procedure: Left Hamstring Release;  Surgeon: Newt Minion, MD;  Location: [place];  Service: Orthopedics;  Laterality: Left;  . REVISON OF ARTERIOVENOUS FISTULA Left 01/30/2016   Procedure: CEPHALIC VEIN TURNDOWN TO LEFT UPPER ARM;  Surgeon: Elam Dutch, MD;  Location: [place];  Service: Vascular;  Laterality: Left;  . STUMP REVISION Right 05/20/2014   Procedure: Revision Right Below Knee Amputation;  Surgeon: Newt Minion, MD;  Location: [facility];  Service: Orthopedics;  Laterality: Right;  . TEE WITHOUT CARDIOVERSION N/A 04/20/2013   Procedure: TRANSESOPHAGEAL ECHOCARDIOGRAM (TEE);  Surgeon: Josue Hector, MD;  Location: [facility]OSCOPY;  Service: Cardiovascular;  Laterality: N/A;  . THYROIDECTOMY    . TOE AMPUTATION     D/C 04-30-13  . TRANSMETATARSAL AMPUTATION Left 12/16/2012   Procedure: TRANSMETATARSAL AMPUTATION AND VAC PLACEMENT;  Surgeon: Elam Dutch, MD;  Location: [facility];  Service: Vascular;  Laterality: Left;    There were no vitals filed for this visit.     05/30/16 0808  Symptoms/Limitations  Subjective No new complaints. No falls. Has socks on with prostheses today. Sore after last session in back and knees.   Patient is accompained by: Family member (daughterTommi Rumps)  Pertinent History bilateral transtibial  amputations, anemia, asthma, contracture of L knee, decubitus ulcer of sacral region, DM with PVD, ESRD, glaucoma, heart murmur, HTN, hypothyroidism, multiple myeloma, PAD  Limitations Lifting;Standing;Walking;House hold activities  Patient Stated Goals walk without a person assisting him (walk with device only)   Pain Assessment  Currently in Pain? No/denies      05/30/16 1822  Transfers  Transfers Sit to Stand;Stand to [facility]  Sit to Stand 6: Modified independent (Device/Increase time);With upper extremity assist;With armrests;From chair/3-in-1 (requires BUE support on RW )  Stand to Sit 6: Modified independent (Device/Increase time);With upper extremity assist;With armrests;To chair/3-in-1 (requires BUE support on RW )  Stand Pivot Transfers 5: Supervision  Stand Pivot Transfer Details (indicate cue type and reason) wheelchair to/from low mat table.    Issued the following to pt's HEP: Hamstring Stretch (Sitting)    Sitting, extend one leg (can be on bed as shown or seated in chair with foot propped on  another surface) and place hands on same thigh for support. Keeping torso straight, lean forward, sliding hands down leg, until a stretch is felt in back of thigh. Hold __60__ seconds. Repeat with other leg. 3 reps each leg. 1-2 times a day.  Copyright  VHI. All rights reserved.   Bridge    Lie back, legs bent.lift hips [place] as high as you can. Hold for 3-5 seconds. 10 reps.  Perform _1-2_ sessions per day.  http://pm.exer.us/54   Copyright  VHI. All rights reserved.  Lower Trunk Rotation Stretch    Keeping back flat and feet together, rotate knees to left side. Hold 30 seconds. Then rotate knees toward other side. Hold for 30 seconds. Repeat __4__ times toward each side. Do _1_ sets per session. Do _1-2_ sessions per day.  http://orth.exer.us/122   Copyright  VHI. All rights reserved.    Hip Flexor Stretch    Lying on back near edge of bed,  bend one leg, foot flat. Hang other leg over edge, relaxed, thigh resting entirely on bed for _1___ minutes. Repeat __3_ times each leg. Do _1-2___ sessions per day.  http://gt2.exer.us/346   Copyright  VHI. All rights reserved.     05/30/16 0841  PT Education  Education provided Yes  Education Details HEP: LE stretching program  Person(s) Educated Patient;Child(ren)  Methods Explanation;Demonstration;Verbal cues;Handout  Comprehension Verbalized understanding;Returned demonstration;Verbal cues required;Need further instruction         PT Short Term Goals - 05/28/16 1300      PT SHORT TERM GOAL #1   Title Patient will verbalize understanding and return demonstration of initial HEP. (TARGET DATE: 06/28/2016)    Time 1   Period Months   Status New     PT SHORT TERM GOAL #2   Title Patient will demonstrate ability to reach 5 inches anteriorly and reach within 10 inches from the floor unilateral UE support on RW with supervision to indicate a decrease in his risk of falling. (TARGET DATE: 06/28/2016)    Time 1   Period Months   Status New     PT SHORT TERM GOAL #3   Title PT will perform TUG, and short term goal will be set. (TARGET DATE: 06/28/2016)    Time 1   Period Months   Status New     PT SHORT TERM GOAL #4   Title Patient will demonstrate ability to ambulate 150 feet on indoor surfaces with rollator style walker and supervision to indicate a decrease in his risk of falling. (TARGET DATE: 06/28/2016)   Time 1   Period Months   Status New           PT Long Term Goals - 05/28/16 1249      PT LONG TERM GOAL #1   Title Patient will verbalize understanding and return demonstration of ongoing HEP program. (TARGET DATE: 07/26/2016)    Time 2   Period Months   Status New     PT LONG TERM GOAL #2   Title Patient will ambulate 500 feet on firm, paved outdoor surfaces with family support to indicate a decrease risk of falling when returning to community ambulation with  family support. (TARGET DATE: 07/26/2016)    Time 2   Period Months   Status New     PT LONG TERM GOAL #3   Title Patient will be able to ambulate 100 feet with rollator style walker with mod I to indicate a decrease in his risk of falling while ambulating within  his house. (TARGET DATE: 07/26/2016)    Time 2   Period Months   Status New     PT LONG TERM GOAL #4   Title Patient will be able to ambulate over ramp/curb with rollator style walker with family assistance to indicate a decrease in his risk of falling when returning to community ambulation with family. (TARGET DATE: 07/26/2016)    Time 2   Period Months   Status New     PT LONG TERM GOAL #5   Title Patient will demonstrate ability to reach 8 inches anteriorly and reach within 8 inches from the floor unilateral UE support on RW with supervision to indicate a decrease in his risk of falling. (TARGET DATE: 06/28/2016)    Time 2   Period Months   Status New      05/30/16 0809  Plan  Clinical Impression Statement Today's skilled session focused on initiation of HEP for stretching/flexibility with no issues reported in session. Pt should benefit from continued PT to progress toward unmet goals.   Pt will benefit from skilled therapeutic intervention in order to improve on the following deficits Abnormal gait;Decreased activity tolerance;Decreased balance;Decreased coordination;Decreased range of motion;Decreased safety awareness;Decreased knowledge of use of DME;Decreased knowledge of precautions;Decreased endurance;Decreased strength;Difficulty walking;Prosthetic Dependency;Postural dysfunction  Rehab Potential Good  Clinical Impairments Affecting Rehab Potential bilateral transtibial amputations, anemia, asthma, contracture of L knee, decubitus ulcer of sacral region, DM with PVD, ESRD, glaucoma, heart murmur, HTN, hypothyroidism, multiple myeloma, PAD  PT Frequency 2x / week  PT Duration Other (comment) (9 weeks )  PT  Treatment/Interventions ADLs/Self Care Home Management;Neuromuscular re-education;Balance training;Therapeutic exercise;Therapeutic activities;Functional mobility training;Stair training;Gait training;DME Instruction;Patient/family education;Prosthetic Training;Manual techniques;Scar mobilization;Passive range of motion  PT Next Visit Plan assess prosthetic gait with rollator versus upright walker (patient currently owns rollator), continued to work on LE stretching/strengthening  Consulted and Agree with Plan of Care Patient;Family member/caregiver (daughter [name] )       Patient will benefit from skilled therapeutic intervention in order to improve the following deficits and impairments:  Abnormal gait, Decreased activity tolerance, Decreased balance, Decreased coordination, Decreased range of motion, Decreased safety awareness, Decreased knowledge of use of DME, Decreased knowledge of precautions, Decreased endurance, Decreased strength, Difficulty walking, Prosthetic Dependency, Postural dysfunction  Visit Diagnosis: Muscle weakness (generalized)  Contracture of muscle, multiple sites     Problem List Patient Active Problem List   Diagnosis Date Noted  . Dyslipidemia 11/21/2015  . Diabetic retinopathy ([place])- left eye 10/02/2015  . Complications, amputation stump late ([place]) 05/20/2014  . Weakness generalized 04/07/2014  . Sepsis ([place]) 04/07/2014  . Decubitus ulcer, stage II 04/07/2014  . Decubitus ulcer of ankle   . Fatigue   . Wound infection 04/06/2014  . Decubitus ulcer of sacral region, stage 2 03/07/2014  . Glaucoma 03/07/2014  . Gout 03/07/2014  . Below knee amputation status ([place]) 02/18/2014  . Osteomyelitis ([place]) 01/04/2014  . Phantom limb ([place]) 12/14/2013  . Type 2 diabetes mellitus with diabetic foot infection ([place])   . Diabetes mellitus with peripheral vascular disease ([place])   . Asthma 05/17/2013  . History of MRSA infection 04/22/2013  . Peripheral vascular  disease ([place]) 11/19/2012  . End-stage renal disease on hemodialysis ([place]) 05/05/2012  . Kahler disease ([place]) 03/30/2012  . Anemia in chronic kidney disease 03/19/2012  . Hypothyroidism following radioiodine therapy 03/19/2012  . Anemia, iron deficiency 03/19/2012  . Hypertension 03/18/2012  . Chronic kidney disease (CKD), stage V ([place]) 03/18/2012  . Cardiac  conduction disorder 03/18/2012  . MGUS (monoclonal gammopathy of unknown significance) 02/28/2011  . Monoclonal paraproteinemia 02/28/2011    Willow Ora, PTA, [facility] [address], [place], [place] [address] [phone] 05/31/16, 6:21 PM   Name: Lenvil Swaim MRN: [id number] Date of Birth: 04/26/1945

## 2016-06-04 ENCOUNTER — Encounter: Payer: Medicare Other | Admitting: Physical Therapy

## 2016-06-04 DIAGNOSIS — T82598A Other mechanical complication of other cardiac and vascular devices and implants, initial encounter: Secondary | ICD-10-CM | POA: Diagnosis not present

## 2016-06-04 DIAGNOSIS — E1029 Type 1 diabetes mellitus with other diabetic kidney complication: Secondary | ICD-10-CM | POA: Diagnosis not present

## 2016-06-04 DIAGNOSIS — D689 Coagulation defect, unspecified: Secondary | ICD-10-CM | POA: Diagnosis not present

## 2016-06-04 DIAGNOSIS — Z992 Dependence on renal dialysis: Secondary | ICD-10-CM | POA: Diagnosis not present

## 2016-06-04 DIAGNOSIS — I871 Compression of vein: Secondary | ICD-10-CM | POA: Diagnosis not present

## 2016-06-04 DIAGNOSIS — N2581 Secondary hyperparathyroidism of renal origin: Secondary | ICD-10-CM | POA: Diagnosis not present

## 2016-06-04 DIAGNOSIS — T82868A Thrombosis of vascular prosthetic devices, implants and grafts, initial encounter: Secondary | ICD-10-CM | POA: Diagnosis not present

## 2016-06-04 DIAGNOSIS — D631 Anemia in chronic kidney disease: Secondary | ICD-10-CM | POA: Diagnosis not present

## 2016-06-04 DIAGNOSIS — N186 End stage renal disease: Secondary | ICD-10-CM | POA: Diagnosis not present

## 2016-06-05 DIAGNOSIS — N186 End stage renal disease: Secondary | ICD-10-CM | POA: Diagnosis not present

## 2016-06-05 DIAGNOSIS — T82598A Other mechanical complication of other cardiac and vascular devices and implants, initial encounter: Secondary | ICD-10-CM | POA: Diagnosis not present

## 2016-06-05 DIAGNOSIS — D631 Anemia in chronic kidney disease: Secondary | ICD-10-CM | POA: Diagnosis not present

## 2016-06-05 DIAGNOSIS — E1029 Type 1 diabetes mellitus with other diabetic kidney complication: Secondary | ICD-10-CM | POA: Diagnosis not present

## 2016-06-05 DIAGNOSIS — D689 Coagulation defect, unspecified: Secondary | ICD-10-CM | POA: Diagnosis not present

## 2016-06-05 DIAGNOSIS — N2581 Secondary hyperparathyroidism of renal origin: Secondary | ICD-10-CM | POA: Diagnosis not present

## 2016-06-06 ENCOUNTER — Encounter: Payer: Self-pay | Admitting: Physical Therapy

## 2016-06-06 ENCOUNTER — Ambulatory Visit: Payer: Medicare Other | Admitting: Physical Therapy

## 2016-06-06 ENCOUNTER — Other Ambulatory Visit: Payer: Self-pay | Admitting: Vascular Surgery

## 2016-06-06 DIAGNOSIS — R2689 Other abnormalities of gait and mobility: Secondary | ICD-10-CM | POA: Diagnosis not present

## 2016-06-06 DIAGNOSIS — M6249 Contracture of muscle, multiple sites: Secondary | ICD-10-CM

## 2016-06-06 DIAGNOSIS — R2681 Unsteadiness on feet: Secondary | ICD-10-CM | POA: Diagnosis not present

## 2016-06-06 DIAGNOSIS — M6281 Muscle weakness (generalized): Secondary | ICD-10-CM | POA: Diagnosis not present

## 2016-06-06 DIAGNOSIS — T82868S Thrombosis of vascular prosthetic devices, implants and grafts, sequela: Secondary | ICD-10-CM

## 2016-06-06 DIAGNOSIS — Z01810 Encounter for preprocedural cardiovascular examination: Secondary | ICD-10-CM

## 2016-06-06 NOTE — Therapy (Signed)
[name] [address] [place], [place], [address] Phone: [phone]   Fax:  [phone]  Physical Therapy Treatment  Patient Details  Name: Jack [name] MRN: [id number] Date of Birth: November 01, 1945 Referring Provider: Meridee Score MD   Encounter Date: 06/06/2016      PT End of Session - 06/06/16 0816    Visit Number 3   Number of Visits 18   Date for PT Re-Evaluation 07/26/16   Authorization Type Medicare & G-codes every 10th visit    PT Start Time 0805   PT Stop Time 0847   PT Time Calculation (min) 42 min   Equipment Utilized During Treatment Gait belt   Activity Tolerance Patient tolerated treatment well   Behavior During Therapy [facility] for tasks assessed/performed      Past Medical History:  Diagnosis Date  . Anemia   . Asthma   . Contracture of joint    left knee  . Decubitus ulcer of sacral region, stage 2 03/07/2014  . Diabetes mellitus with peripheral vascular disease ([place])   . End-stage renal disease on hemodialysis [facility])    Started HD March 2014.  Cause of ESRD was DM.  Gets HD at [facility] on [address] on MWF schedule.   Marland Kitchen ESRD on hemodialysis ([place])   . Gangrene ([place])    right BKA  . Gangrene of foot ([place])   . Glaucoma   . Glaucoma 03/07/2014  . Heart murmur   . History of MRSA infection 04/22/2013   Bacteremia assoc w L foot wound infection Mar 2015   . Hyperparathyroidism, secondary renal ([place])   . Hypertension   . Hypothyroidism   . MRSA bacteremia   . Multiple myeloma, without mention of having achieved remission 03/30/2012   Cytogenetic neg on 03/23/2012.  Marland Kitchen Peripheral arterial disease ([place])   . Peripheral vascular disease, unspecified ([place]) 11/19/2012   In the past had R foot toe amps then R TMA. In 2015 had left foot toe amp > then TMA >then L BKA on 05/14/13   . Pneumonia    2012  . Thyroid disease    hyperparathyroidism    Past Surgical History:  Procedure Laterality Date   . ABDOMINAL AORTAGRAM Bilateral 11/06/2012   Procedure: ABDOMINAL AORTAGRAM;  Surgeon: Elam Dutch, MD;  Location: [facility] CATH LAB;  Service: Cardiovascular;  Laterality: Bilateral;  . AMPUTATION Right 11/10/2012   Procedure: AMPUTATION FIRST and SECOND TOES Right Foot;  Surgeon: Elam Dutch, MD;  Location: [place];  Service: Vascular;  Laterality: Right;  . AMPUTATION Left 04/07/2013   Procedure: AMPUTATION DIGIT- LEFT 1ST TOE;  Surgeon: Mal Misty, MD;  Location: [place];  Service: Vascular;  Laterality: Left;  . AMPUTATION Left 04/26/2013   Procedure: Left Foot Transmetatarsal Amputation;  Surgeon: Newt Minion, MD;  Location: [place];  Service: Orthopedics;  Laterality: Left;  . AMPUTATION Left 05/14/2013   Procedure: AMPUTATION BELOW KNEE;  Surgeon: Newt Minion, MD;  Location: [place];  Service: Orthopedics;  Laterality: Left;  Left Below Knee Amputation  . AMPUTATION Left 07/23/2013   Procedure: AMPUTATION BELOW KNEE;  Surgeon: Newt Minion, MD;  Location: [place];  Service: Orthopedics;  Laterality: Left;  Left Below Knee Amputation Revision  . AMPUTATION Right 02/18/2014   Procedure: AMPUTATION BELOW KNEE;  Surgeon: Newt Minion, MD;  Location: [place];  Service: Orthopedics;  Laterality: Right;  . AV FISTULA PLACEMENT Left 03/25/2012   Procedure: ARTERIOVENOUS (AV)  FISTULA CREATION;  Surgeon: Mal Misty, MD;  Location: [place];  Service: Vascular;  Laterality: Left;  . BELOW KNEE LEG AMPUTATION Left 05/14/2013   DR [name]  . BELOW KNEE LEG AMPUTATION Right 02/18/2014   dr duda  . CARDIAC CATHETERIZATION     approx 30 years ago  . CERVICAL DISC SURGERY    . EYE SURGERY Bilateral    CATARACTS  . I&D EXTREMITY Left 04/22/2013   Procedure: IRRIGATION AND DEBRIDEMENT LEFT FIRST TOE AMPUTATION WOUND ;  Surgeon: Mal Misty, MD;  Location: [place];  Service: Vascular;  Laterality: Left;  . I&D EXTREMITY Right 01/05/2014   Procedure: IRRIGATION AND DEBRIDEMENT Right Heel Ulcer;  Surgeon:  Mcarthur Rossetti, MD;  Location: [place];  Service: Orthopedics;  Laterality: Right;  Surgeon Available after 5PM  . INSERTION OF DIALYSIS CATHETER Right 03/19/2012   Procedure: INSERTION OF DIALYSIS CATHETER;  Surgeon: Mal Misty, MD;  Location: [place];  Service: Vascular;  Laterality: Right;  Right Internal Jugular  . LIGATION OF COMPETING BRANCHES OF ARTERIOVENOUS FISTULA Left 05/08/2012   Procedure: LIGATION OF COMPETING BRANCHES OF ARTERIOVENOUS FISTULA;  Surgeon: Mal Misty, MD;  Location: [place];  Service: Vascular;  Laterality: Left;  Ultrasound guided  . REPAIR QUADRICEPS/HAMSTRING MUSCLES Left 05/20/2014   Procedure: Left Hamstring Release;  Surgeon: Newt Minion, MD;  Location: [place];  Service: Orthopedics;  Laterality: Left;  . REVISON OF ARTERIOVENOUS FISTULA Left 01/30/2016   Procedure: CEPHALIC VEIN TURNDOWN TO LEFT UPPER ARM;  Surgeon: Elam Dutch, MD;  Location: [place];  Service: Vascular;  Laterality: Left;  . STUMP REVISION Right 05/20/2014   Procedure: Revision Right Below Knee Amputation;  Surgeon: Newt Minion, MD;  Location: [place];  Service: Orthopedics;  Laterality: Right;  . TEE WITHOUT CARDIOVERSION N/A 04/20/2013   Procedure: TRANSESOPHAGEAL ECHOCARDIOGRAM (TEE);  Surgeon: Josue Hector, MD;  Location[facility] ENDOSCOPY;  Service: Cardiovascular;  Laterality: N/A;  . THYROIDECTOMY    . TOE AMPUTATION     D/C 04-30-13  . TRANSMETATARSAL AMPUTATION Left 12/16/2012   Procedure: TRANSMETATARSAL AMPUTATION AND VAC PLACEMENT;  Surgeon: Elam Dutch, MD;  Location: [place];  Service: Vascular;  Laterality: Left;    There were no vitals filed for this visit.      Subjective Assessment - 06/06/16 0808    Subjective No new complaints. No falls. To clinic with rollator today. [facility] did not pick him up yesterday, so missed appt with Gerald Stabs. Rescheduled for June 1st to have prosthesis checked. Has done some of the stretches since last session.    Patient is accompained by:  Family member  daughterTommi [name]   Pertinent History bilateral transtibial amputations, anemia, asthma, contracture of L knee, decubitus ulcer of sacral region, DM with PVD, ESRD, glaucoma, heart murmur, HTN, hypothyroidism, multiple myeloma, PAD   Limitations Lifting;Standing;Walking;House hold activities   Patient Stated Goals walk without a person assisting him (walk with device only)    Currently in Pain? No/denies            [facility] Adult PT Treatment/Exercise - 06/06/16 0817      Transfers   Transfers Sit to Stand;Stand to Sit;Stand Pivot Transfers   Sit to Stand 4: Min guard;With upper extremity assist;From chair/3-in-1;4: Min assist   Sit to Stand Details Verbal cues for precautions/safety;Verbal cues for safe use of DME/AE;Verbal cues for technique   Sit to Stand Details (indicate cue type and reason) cues for weight shifting  and use of brakes with standing   Stand to Sit 4: Min guard;4: Min assist;With upper extremity assist;To chair/3-in-1   Stand to Sit Details (indicate cue type and reason) Verbal cues for technique;Verbal cues for precautions/safety;Verbal cues for safe use of DME/AE   Stand to Sit Details cues to keep rollator/upright walker closer with turning and backing up to chair for safety and improved balance with sitting down.      Ambulation/Gait   Ambulation/Gait Yes   Ambulation/Gait Assistance 4: Min guard;3: Mod assist;4: Min assist   Ambulation/Gait Assistance Details cues on posture, step length, to clear foot with swing phase and on rollator position with gait. trialed upright walker this session as well with pt needing min to mod assist for control, balance and to prevent upright walker from tipping over toward left lateral side.                 Ambulation Distance (Feet) 100 Feet  x1, 115 x2 100 x1 with upright walker   Assistive device Rollator  upright walker   Gait Pattern Step-through pattern;Decreased step length - right;Decreased step length -  left;Decreased stride length;Right flexed knee in stance;Left flexed knee in stance;Trunk flexed;Wide base of support;Poor foot clearance - left;Poor foot clearance - right   Ambulation Surface Level;Indoor     Knee/Hip Exercises: Stretches   Passive Hamstring Stretch Both;2 reps;60 seconds;Limitations   Passive Hamstring Stretch Limitations seated in chair with foot/prosthesis propped on stool for hamstring streching prior to gait in session            PT Short Term Goals - 05/28/16 1300      PT SHORT TERM GOAL #1   Title Patient will verbalize understanding and return demonstration of initial HEP. (TARGET DATE: 06/28/2016)    Time 1   Period Months   Status New     PT SHORT TERM GOAL #2   Title Patient will demonstrate ability to reach 5 inches anteriorly and reach within 10 inches from the floor unilateral UE support on RW with supervision to indicate a decrease in his risk of falling. (TARGET DATE: 06/28/2016)    Time 1   Period Months   Status New     PT SHORT TERM GOAL #3   Title PT will perform TUG, and short term goal will be set. (TARGET DATE: 06/28/2016)    Time 1   Period Months   Status New     PT SHORT TERM GOAL #4   Title Patient will demonstrate ability to ambulate 150 feet on indoor surfaces with rollator style walker and supervision to indicate a decrease in his risk of falling. (TARGET DATE: 06/28/2016)   Time 1   Period Months   Status New           PT Long Term Goals - 05/28/16 1249      PT LONG TERM GOAL #1   Title Patient will verbalize understanding and return demonstration of ongoing HEP program. (TARGET DATE: 07/26/2016)    Time 2   Period Months   Status New     PT LONG TERM GOAL #2   Title Patient will ambulate 500 feet on firm, paved outdoor surfaces with family support to indicate a decrease risk of falling when returning to community ambulation with family support. (TARGET DATE: 07/26/2016)    Time 2   Period Months   Status New     PT  LONG TERM GOAL #3   Title Patient will be able  to ambulate 100 feet with rollator style walker with mod I to indicate a decrease in his risk of falling while ambulating within his house. (TARGET DATE: 07/26/2016)    Time 2   Period Months   Status New     PT LONG TERM GOAL #4   Title Patient will be able to ambulate over ramp/curb with rollator style walker with family assistance to indicate a decrease in his risk of falling when returning to community ambulation with family. (TARGET DATE: 07/26/2016)    Time 2   Period Months   Status New     PT LONG TERM GOAL #5   Title Patient will demonstrate ability to reach 8 inches anteriorly and reach within 8 inches from the floor unilateral UE support on RW with supervision to indicate a decrease in his risk of falling. (TARGET DATE: 06/28/2016)    Time 2   Period Months   Status New               Plan - 06/06/16 0816    Clinical Impression Statement Today's skilled session address bil hamsting tightness prior to gait with rollator vs upright walker. Pt much more unsteady with upright walker, needing increased assistance for balane and walker negotiation. Pt stated he prefers the rollator over the upright walker as well due to feeling more stable with rollator. Pt very fatigued at end of session, unable to walk all the way out, needing a wheelchair for last ~50 feet out of session. Daughter instructed to use wheelchair to get pt on [employer] bus and then bring it back into clinic. Pt instructed to return to use of wheelchair for PT sessions at this time until his endurance improves. Pt agreed. Pt is progressing towards goals and should benefit from continued PT to progress toward unmet goals.                                        Rehab Potential Good   Clinical Impairments Affecting Rehab Potential bilateral transtibial amputations, anemia, asthma, contracture of L knee, decubitus ulcer of sacral region, DM with PVD, ESRD, glaucoma, heart murmur,  HTN, hypothyroidism, multiple myeloma, PAD   PT Frequency 2x / week   PT Duration Other (comment)  9 weeks    PT Treatment/Interventions ADLs/Self Care Home Management;Neuromuscular re-education;Balance training;Therapeutic exercise;Therapeutic activities;Functional mobility training;Stair training;Gait training;DME Instruction;Patient/family education;Prosthetic Training;Manual techniques;Scar mobilization;Passive range of motion   PT Next Visit Plan continued to work on LE stretching/strengthening, activity tolerance, gait with rollator   Consulted and Agree with Plan of Care Patient;Family member/caregiver  daughter Jack [name]       Patient will benefit from skilled therapeutic intervention in order to improve the following deficits and impairments:  Abnormal gait, Decreased activity tolerance, Decreased balance, Decreased coordination, Decreased range of motion, Decreased safety awareness, Decreased knowledge of use of DME, Decreased knowledge of precautions, Decreased endurance, Decreased strength, Difficulty walking, Prosthetic Dependency, Postural dysfunction  Visit Diagnosis: Muscle weakness (generalized)  Contracture of muscle, multiple sites  Unsteadiness on feet  Other abnormalities of gait and mobility     Problem List Patient Active Problem List   Diagnosis Date Noted  . Dyslipidemia 11/21/2015  . Diabetic retinopathy ([place])- left eye 10/02/2015  . Complications, amputation stump late ([place]) 05/20/2014  . Weakness generalized 04/07/2014  . Sepsis ([place]) 04/07/2014  . Decubitus ulcer, stage II 04/07/2014  . Decubitus ulcer of  ankle   . Fatigue   . Wound infection 04/06/2014  . Decubitus ulcer of sacral region, stage 2 03/07/2014  . Glaucoma 03/07/2014  . Gout 03/07/2014  . Below knee amputation status ([place]) 02/18/2014  . Osteomyelitis ([place]) 01/04/2014  . Phantom limb ([place]) 12/14/2013  . Type 2 diabetes mellitus with diabetic foot infection ([place])   . Diabetes mellitus  with peripheral vascular disease ([place])   . Asthma 05/17/2013  . History of MRSA infection 04/22/2013  . Peripheral vascular disease ([place]) 11/19/2012  . End-stage renal disease on hemodialysis ([place]) 05/05/2012  . Kahler disease ([place]) 03/30/2012  . Anemia in chronic kidney disease 03/19/2012  . Hypothyroidism following radioiodine therapy 03/19/2012  . Anemia, iron deficiency 03/19/2012  . Hypertension 03/18/2012  . Chronic kidney disease (CKD), stage V ([place]) 03/18/2012  . Cardiac conduction disorder 03/18/2012  . MGUS (monoclonal gammopathy of unknown significance) 02/28/2011  . Monoclonal paraproteinemia 02/28/2011    Willow Ora, PTA, [facility] [address] [place], [place] [address] [phone] 06/06/16, 9:43 PM   Name: Kaylob Wallen MRN: [id number] Date of Birth: August 23, 1945

## 2016-06-07 ENCOUNTER — Encounter (HOSPITAL_COMMUNITY): Payer: Self-pay | Admitting: *Deleted

## 2016-06-07 ENCOUNTER — Emergency Department (HOSPITAL_COMMUNITY)
Admission: EM | Admit: 2016-06-07 | Discharge: 2016-06-07 | Disposition: A | Discharge status: Home / Self Care | Payer: Medicare Other | Attending: Emergency Medicine | Admitting: Emergency Medicine

## 2016-06-07 DIAGNOSIS — I120 Hypertensive chronic kidney disease with stage 5 chronic kidney disease or end stage renal disease: Secondary | ICD-10-CM | POA: Diagnosis not present

## 2016-06-07 DIAGNOSIS — Z7984 Long term (current) use of oral hypoglycemic drugs: Secondary | ICD-10-CM | POA: Diagnosis not present

## 2016-06-07 DIAGNOSIS — E1029 Type 1 diabetes mellitus with other diabetic kidney complication: Secondary | ICD-10-CM | POA: Diagnosis not present

## 2016-06-07 DIAGNOSIS — I440 Atrioventricular block, first degree: Secondary | ICD-10-CM | POA: Diagnosis not present

## 2016-06-07 DIAGNOSIS — E1122 Type 2 diabetes mellitus with diabetic chronic kidney disease: Secondary | ICD-10-CM | POA: Diagnosis not present

## 2016-06-07 DIAGNOSIS — N186 End stage renal disease: Secondary | ICD-10-CM | POA: Insufficient documentation

## 2016-06-07 DIAGNOSIS — E1159 Type 2 diabetes mellitus with other circulatory complications: Secondary | ICD-10-CM | POA: Insufficient documentation

## 2016-06-07 DIAGNOSIS — E039 Hypothyroidism, unspecified: Secondary | ICD-10-CM | POA: Insufficient documentation

## 2016-06-07 DIAGNOSIS — Z87891 Personal history of nicotine dependence: Secondary | ICD-10-CM | POA: Diagnosis not present

## 2016-06-07 DIAGNOSIS — D631 Anemia in chronic kidney disease: Secondary | ICD-10-CM | POA: Diagnosis not present

## 2016-06-07 DIAGNOSIS — Z7982 Long term (current) use of aspirin: Secondary | ICD-10-CM | POA: Diagnosis not present

## 2016-06-07 DIAGNOSIS — R001 Bradycardia, unspecified: Secondary | ICD-10-CM | POA: Diagnosis not present

## 2016-06-07 DIAGNOSIS — T82598A Other mechanical complication of other cardiac and vascular devices and implants, initial encounter: Secondary | ICD-10-CM | POA: Diagnosis not present

## 2016-06-07 DIAGNOSIS — E11319 Type 2 diabetes mellitus with unspecified diabetic retinopathy without macular edema: Secondary | ICD-10-CM | POA: Insufficient documentation

## 2016-06-07 DIAGNOSIS — D689 Coagulation defect, unspecified: Secondary | ICD-10-CM | POA: Diagnosis not present

## 2016-06-07 DIAGNOSIS — J45909 Unspecified asthma, uncomplicated: Secondary | ICD-10-CM | POA: Insufficient documentation

## 2016-06-07 DIAGNOSIS — Z992 Dependence on renal dialysis: Secondary | ICD-10-CM | POA: Diagnosis not present

## 2016-06-07 DIAGNOSIS — N2581 Secondary hyperparathyroidism of renal origin: Secondary | ICD-10-CM | POA: Diagnosis not present

## 2016-06-07 DIAGNOSIS — I499 Cardiac arrhythmia, unspecified: Secondary | ICD-10-CM | POA: Diagnosis not present

## 2016-06-07 LAB — CBC WITH DIFFERENTIAL/PLATELET
BASOS ABS: 0 10*3/uL (ref 0.0–0.1)
BASOS PCT: 0 %
Eosinophils Absolute: 0.1 10*3/uL (ref 0.0–0.7)
Eosinophils Relative: 1 %
HEMATOCRIT: 40.9 % (ref 39.0–52.0)
Hemoglobin: 12.8 g/dL — ABNORMAL LOW (ref 13.0–17.0)
Lymphocytes Relative: 21 %
Lymphs Abs: 1.8 10*3/uL (ref 0.7–4.0)
MCH: 26.6 pg (ref 26.0–34.0)
MCHC: 31.3 g/dL (ref 30.0–36.0)
MCV: 84.9 fL (ref 78.0–100.0)
Monocytes Absolute: 0.8 10*3/uL (ref 0.1–1.0)
Monocytes Relative: 9 %
NEUTROS ABS: 5.8 10*3/uL (ref 1.7–7.7)
NEUTROS PCT: 69 %
Platelets: 233 10*3/uL (ref 150–400)
RBC: 4.82 MIL/uL (ref 4.22–5.81)
RDW: 14.8 % (ref 11.5–15.5)
WBC: 8.5 10*3/uL (ref 4.0–10.5)

## 2016-06-07 LAB — COMPREHENSIVE METABOLIC PANEL
ALBUMIN: 4 g/dL (ref 3.5–5.0)
ALT: 13 U/L — ABNORMAL LOW (ref 17–63)
AST: 16 U/L (ref 15–41)
Alkaline Phosphatase: 49 U/L (ref 38–126)
Anion gap: 10 (ref 5–15)
BILIRUBIN TOTAL: 0.7 mg/dL (ref 0.3–1.2)
BUN: 9 mg/dL (ref 6–20)
CHLORIDE: 100 mmol/L — AB (ref 101–111)
CO2: 26 mmol/L (ref 22–32)
Calcium: 8.2 mg/dL — ABNORMAL LOW (ref 8.9–10.3)
Creatinine, Ser: 3.21 mg/dL — ABNORMAL HIGH (ref 0.61–1.24)
GFR calc Af Amer: 21 mL/min — ABNORMAL LOW (ref >60)
GFR calc non Af Amer: 18 mL/min — ABNORMAL LOW (ref >60)
GLUCOSE: 121 mg/dL — AB (ref 65–99)
POTASSIUM: 3.3 mmol/L — AB (ref 3.5–5.1)
SODIUM: 136 mmol/L (ref 135–145)
Total Protein: 7.7 g/dL (ref 6.5–8.1)

## 2016-06-07 LAB — I-STAT TROPONIN, ED
TROPONIN I, POC: 0.03 ng/mL (ref 0.00–0.08)
Troponin i, poc: 0.03 ng/mL (ref 0.00–0.08)

## 2016-06-07 NOTE — ED Provider Notes (Signed)
[facility] DEPT Provider Note   CSN: [id number] Arrival date & time: 06/07/16  1056     History   Chief Complaint Chief Complaint  Patient presents with  . Bradycardia    HPI Jack [name] is a 71 y.o. male.  HPI Patient was sent from hemodialysis for 2 episodes of bradycardia in the 30s. Patient reports he did not have any symptoms.. He denies chest pain shortness of breath general weakness or feeling near syncope. He did complete his full dialysis cycle. Past Medical History:  Diagnosis Date  . Anemia   . Asthma   . Contracture of joint    left knee  . Decubitus ulcer of sacral region, stage 2 03/07/2014  . Diabetes mellitus with peripheral vascular disease ([place])   . End-stage renal disease on hemodialysis [facility])    Started HD March 2014.  Cause of ESRD was DM.  Gets HD at [facility] on [address] on MWF schedule.   Marland Kitchen ESRD on hemodialysis ([place])   . Gangrene ([place])    right BKA  . Gangrene of foot ([place])   . Glaucoma   . Glaucoma 03/07/2014  . Heart murmur   . History of MRSA infection 04/22/2013   Bacteremia assoc w L foot wound infection Mar 2015   . Hyperparathyroidism, secondary renal ([place])   . Hypertension   . Hypothyroidism   . MRSA bacteremia   . Multiple myeloma, without mention of having achieved remission 03/30/2012   Cytogenetic neg on 03/23/2012.  Marland Kitchen Peripheral arterial disease ([place])   . Peripheral vascular disease, unspecified ([place]) 11/19/2012   In the past had R foot toe amps then R TMA. In 2015 had left foot toe amp > then TMA >then L BKA on 05/14/13   . Pneumonia    2012  . Thyroid disease    hyperparathyroidism    Patient Active Problem List   Diagnosis Date Noted  . Dyslipidemia 11/21/2015  . Diabetic retinopathy ([place])- left eye 10/02/2015  . Complications, amputation stump late ([place]) 05/20/2014  . Weakness generalized 04/07/2014  . Sepsis ([place]) 04/07/2014  . Decubitus ulcer, stage II 04/07/2014  . Decubitus ulcer of ankle   . Fatigue     . Wound infection 04/06/2014  . Decubitus ulcer of sacral region, stage 2 03/07/2014  . Glaucoma 03/07/2014  . Gout 03/07/2014  . Below knee amputation status ([place]) 02/18/2014  . Osteomyelitis ([place]) 01/04/2014  . Phantom limb ([place]) 12/14/2013  . Type 2 diabetes mellitus with diabetic foot infection ([place])   . Diabetes mellitus with peripheral vascular disease ([place])   . Asthma 05/17/2013  . History of MRSA infection 04/22/2013  . Peripheral vascular disease ([place]) 11/19/2012  . End-stage renal disease on hemodialysis ([place]) 05/05/2012  . Kahler disease ([place]) 03/30/2012  . Anemia in chronic kidney disease 03/19/2012  . Hypothyroidism following radioiodine therapy 03/19/2012  . Anemia, iron deficiency 03/19/2012  . Hypertension 03/18/2012  . Chronic kidney disease (CKD), stage V ([place]) 03/18/2012  . Cardiac conduction disorder 03/18/2012  . MGUS (monoclonal gammopathy of unknown significance) 02/28/2011  . Monoclonal paraproteinemia 02/28/2011    Past Surgical History:  Procedure Laterality Date  . ABDOMINAL AORTAGRAM Bilateral 11/06/2012   Procedure: ABDOMINAL AORTAGRAM;  Surgeon: Elam Dutch, MD;  Location: [facility] CATH LAB;  Service: Cardiovascular;  Laterality: Bilateral;  . AMPUTATION Right 11/10/2012   Procedure: AMPUTATION FIRST and SECOND TOES Right Foot;  Surgeon: Elam Dutch, MD;  Location: [place];  Service: Vascular;  Laterality: Right;  .  AMPUTATION Left 04/07/2013   Procedure: AMPUTATION DIGIT- LEFT 1ST TOE;  Surgeon: Mal Misty, MD;  Location: [place];  Service: Vascular;  Laterality: Left;  . AMPUTATION Left 04/26/2013   Procedure: Left Foot Transmetatarsal Amputation;  Surgeon: Newt Minion, MD;  Location: [place];  Service: Orthopedics;  Laterality: Left;  . AMPUTATION Left 05/14/2013   Procedure: AMPUTATION BELOW KNEE;  Surgeon: Newt Minion, MD;  Location: [place];  Service: Orthopedics;  Laterality: Left;  Left Below Knee Amputation  . AMPUTATION Left 07/23/2013    Procedure: AMPUTATION BELOW KNEE;  Surgeon: Newt Minion, MD;  Location: [place];  Service: Orthopedics;  Laterality: Left;  Left Below Knee Amputation Revision  . AMPUTATION Right 02/18/2014   Procedure: AMPUTATION BELOW KNEE;  Surgeon: Newt Minion, MD;  Location: [place];  Service: Orthopedics;  Laterality: Right;  . AV FISTULA PLACEMENT Left 03/25/2012   Procedure: ARTERIOVENOUS (AV) FISTULA CREATION;  Surgeon: Mal Misty, MD;  Location: [place];  Service: Vascular;  Laterality: Left;  . BELOW KNEE LEG AMPUTATION Left 05/14/2013   DR [name]  . BELOW KNEE LEG AMPUTATION Right 02/18/2014   dr duda  . CARDIAC CATHETERIZATION     approx 30 years ago  . CERVICAL DISC SURGERY    . EYE SURGERY Bilateral    CATARACTS  . I&D EXTREMITY Left 04/22/2013   Procedure: IRRIGATION AND DEBRIDEMENT LEFT FIRST TOE AMPUTATION WOUND ;  Surgeon: Mal Misty, MD;  Location: [place];  Service: Vascular;  Laterality: Left;  . I&D EXTREMITY Right 01/05/2014   Procedure: IRRIGATION AND DEBRIDEMENT Right Heel Ulcer;  Surgeon: Mcarthur Rossetti, MD;  Location: [place];  Service: Orthopedics;  Laterality: Right;  Surgeon Available after 5PM  . INSERTION OF DIALYSIS CATHETER Right 03/19/2012   Procedure: INSERTION OF DIALYSIS CATHETER;  Surgeon: Mal Misty, MD;  Location: [place];  Service: Vascular;  Laterality: Right;  Right Internal Jugular  . LIGATION OF COMPETING BRANCHES OF ARTERIOVENOUS FISTULA Left 05/08/2012   Procedure: LIGATION OF COMPETING BRANCHES OF ARTERIOVENOUS FISTULA;  Surgeon: Mal Misty, MD;  Location: [place];  Service: Vascular;  Laterality: Left;  Ultrasound guided  . REPAIR QUADRICEPS/HAMSTRING MUSCLES Left 05/20/2014   Procedure: Left Hamstring Release;  Surgeon: Newt Minion, MD;  Location: [place];  Service: Orthopedics;  Laterality: Left;  . REVISON OF ARTERIOVENOUS FISTULA Left 01/30/2016   Procedure: CEPHALIC VEIN TURNDOWN TO LEFT UPPER ARM;  Surgeon: Elam Dutch, MD;  Location: [place];   Service: Vascular;  Laterality: Left;  . STUMP REVISION Right 05/20/2014   Procedure: Revision Right Below Knee Amputation;  Surgeon: Newt Minion, MD;  Location: [place];  Service: Orthopedics;  Laterality: Right;  . TEE WITHOUT CARDIOVERSION N/A 04/20/2013   Procedure: TRANSESOPHAGEAL ECHOCARDIOGRAM (TEE);  Surgeon: Josue Hector, MD;  Location: [facility] ENDOSCOPY;  Service: Cardiovascular;  Laterality: N/A;  . THYROIDECTOMY    . TOE AMPUTATION     D/C 04-30-13  . TRANSMETATARSAL AMPUTATION Left 12/16/2012   Procedure: TRANSMETATARSAL AMPUTATION AND VAC PLACEMENT;  Surgeon: Elam Dutch, MD;  Location: [facility] OR;  Service: Vascular;  Laterality: Left;       Home Medications    Prior to Admission medications   Medication Sig Start Date End Date Taking? Authorizing Provider  aspirin EC 81 MG tablet Take 81 mg by mouth daily.    [provider]  atorvastatin (LIPITOR) 10 MG tablet Take 1 tablet (10 mg total) by  mouth daily. 03/19/16   Martinique, Betty G, MD  calcium acetate (PHOSLO) 667 MG capsule Take 1,334 mg by mouth 3 (three) times daily with meals.    [provider]  diphenhydrAMINE (BENADRYL) 25 MG tablet Take 100 mg by mouth as directed. The morning of fistula cleaning    [provider]  glipiZIDE (GLUCOTROL) 5 MG tablet Take 0.5 tablets (2.5 mg total) by mouth daily before breakfast. 03/19/16   Martinique, Betty G, MD  levothyroxine (SYNTHROID, LEVOTHROID) 150 MCG tablet TAKE 1 TABLET (150 MCG TOTAL) BY MOUTH DAILY. 01/02/16   Martinique, Betty G, MD  midodrine (PROAMATINE) 10 MG tablet Take 10 mg by mouth 3 (three) times a week. Mon, Wed, Fri before dialysis    [provider]  multivitamin (RENA-VIT) TABS tablet Take 1 tablet by mouth at bedtime.    [provider]  oxyCODONE-acetaminophen (ROXICET) 5-325 MG tablet Take 1 tablet by mouth every 12 (twelve) hours as needed for moderate pain or severe pain. 03/19/16   Martinique, Betty G, MD  predniSONE (DELTASONE) 20  MG tablet Take 40 mg by mouth. Take 68ms twice daily the day before fistula cleaning and 457m the day of the fistula cleaning    [provider]    Family History Family History  Problem Relation Age of Onset  . Diabetes Mother   . Cancer Mother        bone   . Kidney disease Father     Social History Social History  Substance Use Topics  . Smoking status: Former Smoker    Types: Cigarettes    Quit date: 03/19/1982  . Smokeless tobacco: Never Used  . Alcohol use No     Comment: formerly     Allergies   Bee venom; Lisinopril; Ivp dye [iodinated diagnostic agents]; and Morphine and related   Review of Systems Review of Systems  10 Systems reviewed and are negative for acute change except as noted in the HPI.  Physical Exam Updated Vital Signs BP 123/89 (BP Location: Right Arm)   Pulse 75   Temp 98.6 F (37 C)   Resp 18   SpO2 95%   Physical Exam  Constitutional: He is oriented to person, place, and time.  Patient is alert and nontoxic. No respiratory distress. Mental status is clear.  HENT:  Head: Normocephalic and atraumatic.  Mouth/Throat: Oropharynx is clear and moist.  Eyes: EOM are normal.  Neck: Neck supple.  Cardiovascular: Intact distal pulses.   Heart is regular with occasional ectopy.  Pulmonary/Chest: Effort normal and breath sounds normal.  Abdominal: Soft. He exhibits no distension. There is no tenderness. There is no guarding.  Musculoskeletal:  Bilateral BKA. No peripheral edema skin condition excellent.  Neurological: He is alert and oriented to person, place, and time. No cranial nerve deficit. He exhibits normal muscle tone. Coordination normal.  Skin: Skin is warm and dry.  Psychiatric: He has a normal mood and affect.     ED Treatments / Results  Labs (all labs ordered are listed, but only abnormal results are displayed) Labs Reviewed  CBC WITH DIFFERENTIAL/PLATELET - Abnormal; Notable for the following:       Result Value     Hemoglobin 12.8 (*)    All other components within normal limits  COMPREHENSIVE METABOLIC PANEL - Abnormal; Notable for the following:    Potassium 3.3 (*)    Chloride 100 (*)    Glucose, Bld 121 (*)    Creatinine, Ser 3.21 (*)  Calcium 8.2 (*)    ALT 13 (*)    GFR calc non Af Amer 18 (*)    GFR calc Af Amer 21 (*)    All other components within normal limits  I-STAT TROPOININ, ED  Randolm Idol, ED  Randolm Idol, ED    EKG  EKG Interpretation  Date/Time:  Friday Jun 07 2016 11:10:35 EDT Ventricular Rate:  83 PR Interval:    QRS Duration: 90 QT Interval:  451 QTC Calculation: 530 R Axis:   -80 Text Interpretation:  Confirmed by Charlesetta Shanks [phone]) on 06/07/2016 4:36:38 PM       Radiology No results found.  Procedures Procedures (including critical care time)  Medications Ordered in ED Medications - No data to display   Initial Impression / Assessment and Plan / ED Course  I have reviewed the triage vital signs and the nursing notes.  Pertinent labs & imaging results that were available during my care of the patient were reviewed by me and considered in my medical decision making (see chart for details).       Final Clinical Impressions(s) / ED Diagnoses   Final diagnoses:  Bradycardia  First degree AV block  ESRD (end stage renal disease) on dialysis [facility])  Diagnostic studies are stable to baseline. Patient has long PR interval of approximately 250 ms. The rhythm however remains consistent without dropped beats or change in PR interval. He does have occasional PVC. This is consistent with his prior EKG. There was no elevation in the patient's troponin. While in the emergency department his heart rate remained in the 50s to 70s. Patient is not symptomatic with episodes of bradycardia. At this time I feel he is stable for continued outpatient evaluation and possible Holter or event monitor evaluation by cardiology. Return precautions are  reviewed.  New Prescriptions Discharge Medication List as of 06/07/2016  4:34 PM       Charlesetta Shanks, MD 06/12/16 1742

## 2016-06-07 NOTE — ED Triage Notes (Signed)
Pt in from HD center, pt reported to have x 2 bradycardia events while getting tx without symptoms, pt denies SOB, pt HR upper 30s during episodes, rcvd full HD tx, pt A&O x4, c/o HA upon arrival to ED

## 2016-06-10 DIAGNOSIS — N2581 Secondary hyperparathyroidism of renal origin: Secondary | ICD-10-CM | POA: Diagnosis not present

## 2016-06-10 DIAGNOSIS — N186 End stage renal disease: Secondary | ICD-10-CM | POA: Diagnosis not present

## 2016-06-10 DIAGNOSIS — T82598A Other mechanical complication of other cardiac and vascular devices and implants, initial encounter: Secondary | ICD-10-CM | POA: Diagnosis not present

## 2016-06-10 DIAGNOSIS — E1029 Type 1 diabetes mellitus with other diabetic kidney complication: Secondary | ICD-10-CM | POA: Diagnosis not present

## 2016-06-10 DIAGNOSIS — D631 Anemia in chronic kidney disease: Secondary | ICD-10-CM | POA: Diagnosis not present

## 2016-06-10 DIAGNOSIS — D689 Coagulation defect, unspecified: Secondary | ICD-10-CM | POA: Diagnosis not present

## 2016-06-11 ENCOUNTER — Ambulatory Visit (INDEPENDENT_AMBULATORY_CARE_PROVIDER_SITE_OTHER): Payer: Medicare Other | Admitting: Family Medicine

## 2016-06-11 ENCOUNTER — Encounter: Payer: Self-pay | Admitting: Family Medicine

## 2016-06-11 VITALS — BP 150/92 | HR 72 | Resp 12 | Ht 70.0 in

## 2016-06-11 DIAGNOSIS — R001 Bradycardia, unspecified: Secondary | ICD-10-CM

## 2016-06-11 DIAGNOSIS — M542 Cervicalgia: Secondary | ICD-10-CM | POA: Diagnosis not present

## 2016-06-11 DIAGNOSIS — E876 Hypokalemia: Secondary | ICD-10-CM

## 2016-06-11 DIAGNOSIS — I10 Essential (primary) hypertension: Secondary | ICD-10-CM | POA: Diagnosis not present

## 2016-06-11 NOTE — Progress Notes (Signed)
HPI:   ACUTE VISIT:  Chief Complaint  Patient presents with  . Hospitalization Follow-up    [name] is a 71 y.o. male, who is here today with his daughter to follow on recent ER visit.  He was sent to the ER from the dialysis center on 06/07/16 because HR in the mid 30's, according to pt, it was 36/min. He states that he was not having any symptom at this time.  He has noted bradycardia in the mid 40's-50's during dialysis in the past, yesterday it was in the 40's. He sometimes he feels mild pain on left interscapular but otherwise he is asymptomatic.  EKG done yesterday: SR, PVC's, LAD with LAFB, 1st degree AV block. Compared with EKG 01/2016 PVC's are now present, rest no significant changes.  Troponin x 2 in normal range.  Lab Results  Component Value Date   WBC 8.5 06/07/2016   HGB 12.8 (L) 06/07/2016   HCT 40.9 06/07/2016   MCV 84.9 06/07/2016   PLT 233 06/07/2016     He is not sure if he has episodes at home. He is not checking his BP at home. BP mildly elevated today, he is taking Midodrine 5 mg before dialysis, 3 times per week. Last hemodialysis yesterday and next one tomorrow.    Heart Problem  This is a recurrent problem. The current episode started in the past 7 days. The problem occurs intermittently. The problem has been waxing and waning. Associated symptoms include fatigue and neck pain. Pertinent negatives include no abdominal pain, change in bowel habit, chest pain, congestion, coughing, diaphoresis, fever, headaches, joint swelling, nausea, rash, sore throat, vertigo, vomiting or weakness. He has tried nothing for the symptoms.   Lab Results  Component Value Date   CREATININE 3.21 (H) 06/07/2016   BUN 9 06/07/2016   NA 136 06/07/2016   K 3.3 (L) 06/07/2016   CL 100 (L) 06/07/2016   CO2 26 06/07/2016    He had a dialysis catheter placed recently, A-V fistulas have failed in both upper extremities. According to pt, left UE  A-V fistula procedure to "clean" it is planned in the next few days. He states that since cath was placed he has had cervical pain and limitation on ROM. He denies prior Hx. He has not had any direct cervical trauma or falls.  He denies erythema or edema, fever,chills, worsening fatigue, or dyspnea.  DM II, he is taking Glipizide 2.5 mg, has not noted hypoglycemia.  Hypothyroidism, he is on Levothyroxine 150 mcg daily. Lab Results  Component Value Date   TSH 0.92 03/19/2016     Review of Systems  Constitutional: Positive for fatigue. Negative for activity change, appetite change, diaphoresis, fever and unexpected weight change.  HENT: Negative for congestion, nosebleeds, sore throat and trouble swallowing.   Eyes: Negative for redness and visual disturbance.  Respiratory: Negative for cough, shortness of breath and wheezing.   Cardiovascular: Negative for chest pain, palpitations and leg swelling.  Gastrointestinal: Negative for abdominal pain, change in bowel habit, nausea and vomiting.       No changes in bowel habits.  Endocrine: Negative for cold intolerance, heat intolerance, polydipsia, polyphagia and polyuria.  Musculoskeletal: Positive for back pain and neck pain. Negative for joint swelling.  Skin: Negative for pallor and rash.  Neurological: Negative for vertigo, syncope, speech difficulty, weakness and headaches.  Psychiatric/Behavioral: Negative.  Negative for confusion. The patient is not nervous/anxious.       Current Outpatient  Prescriptions on File Prior to Visit  Medication Sig Dispense Refill  . aspirin EC 81 MG tablet Take 81 mg by mouth daily.    Marland Kitchen atorvastatin (LIPITOR) 10 MG tablet Take 1 tablet (10 mg total) by mouth daily. 90 tablet 1  . calcium acetate (PHOSLO) 667 MG capsule Take 1,334 mg by mouth 3 (three) times daily with meals.    . diphenhydrAMINE (BENADRYL) 25 MG tablet Take 100 mg by mouth as directed. The morning of fistula cleaning    .  glipiZIDE (GLUCOTROL) 5 MG tablet Take 0.5 tablets (2.5 mg total) by mouth daily before breakfast. 45 tablet 1  . levothyroxine (SYNTHROID, LEVOTHROID) 150 MCG tablet TAKE 1 TABLET (150 MCG TOTAL) BY MOUTH DAILY. 90 tablet 1  . midodrine (PROAMATINE) 10 MG tablet Take 10 mg by mouth 3 (three) times a week. Mon, Wed, Fri before dialysis    . multivitamin (RENA-VIT) TABS tablet Take 1 tablet by mouth at bedtime.    Marland Kitchen oxyCODONE-acetaminophen (ROXICET) 5-325 MG tablet Take 1 tablet by mouth every 12 (twelve) hours as needed for moderate pain or severe pain. 60 tablet 0  . predniSONE (DELTASONE) 20 MG tablet Take 40 mg by mouth. Take 73ms twice daily the day before fistula cleaning and 477m the day of the fistula cleaning     No current facility-administered medications on file prior to visit.      Past Medical History:  Diagnosis Date  . Anemia   . Asthma   . Contracture of joint    left knee  . Decubitus ulcer of sacral region, stage 2 03/07/2014  . Diabetes mellitus with peripheral vascular disease ([place]  . [facility]   Started HD March 2014.  Cause of ESRD was DM.  [facility].   . Marland KitchenSRD on hemodialysis ([facility]  . Gangrene ([place]   right BKA  . Gangrene of foot ([place]CC[place]  . Glaucoma   . Glaucoma 03/07/2014  . Heart murmur   . History of MRSA infection 04/22/2013   Bacteremia assoc w L foot wound infection Mar 2015   . Hyperparathyroidism, secondary renal ([place]CD[place]  . Hypertension   . Hypothyroidism   . MRSA bacteremia   . Multiple myeloma, without mention of having achieved remission 03/30/2012   Cytogenetic neg on 03/23/2012.  . Marland Kitcheneripheral arterial disease (HC[place]  . Peripheral vascular disease, unspecified (HCMilton11/06/2012   In the past had R foot toe amps then R TMA. In 2015 had left foot toe amp > then TMA >then L BKA on 05/14/13   . Pneumonia    2012  . Thyroid disease    hyperparathyroidism   Allergies    Allergen Reactions  . Bee Venom Anaphylaxis  . Lisinopril Cough  . Ivp Dye [Iodinated Diagnostic Agents] Hives  . Morphine And Related Hives and Other (See Comments)    Bradycardia states patient    Social History   Social History  . Marital status: Divorced    Spouse name: N/A  . Number of children: N/A  . Years of education: N/A   Social History Main Topics  . Smoking status: Former Smoker    Types: Cigarettes    Quit date: 03/19/1982  . Smokeless tobacco: Never Used  . Alcohol use No     Comment: formerly  . Drug use: No  . Sexual activity: No   Other Topics Concern  .  None   Social History Narrative  . None    Vitals:   06/11/16 1621  BP: (!) 150/92  Pulse: 72  Resp: 12  O2 sat at RA 95% There is no height or weight on file to calculate BMI.   Physical Exam  Nursing note and vitals reviewed. Constitutional: He is oriented to person, place, and time. He appears well-developed and well-nourished. No distress.  HENT:  Head: Atraumatic.  Mouth/Throat: Oropharynx is clear and moist and mucous membranes are normal.  Eyes: Conjunctivae and EOM are normal.  Cardiovascular: Normal rate.  An irregular rhythm present.  Murmur (SEM II/VI base) heard. Dialysis catheter right upper chest.  Respiratory: Effort normal and breath sounds normal. No respiratory distress.  GI: Soft. There is no tenderness.  Musculoskeletal: He exhibits no edema.       Cervical back: He exhibits decreased range of motion. He exhibits no tenderness and no bony tenderness.  No muscle spasm or tenderness upon palpation of trapezium or paraspinal cervical muscles,bilateral. BKA bilateral.    Lymphadenopathy:    He has no cervical adenopathy.  Neurological: He is alert and oriented to person, place, and time. He has normal strength.  He is in a wheelchair.  Skin: Skin is warm. No erythema.  Psychiatric: He has a normal mood and affect.  Adequately groomed, good eye contact.    ASSESSMENT  AND PLAN:   Jack [name] was seen today for hospitalization follow-up.  Diagnoses and all orders for this visit:  Essential hypertension  Re-checked 160/80. He is on non pharmacologic treatment, usually has episodes of hypotension after dialysis. BP mildly elevated today. I recommend monitoring BP at home. Low salt diet. Instructed about warning signs.  Bradycardia  Today HR 77/min by my count, irregular. EKG reviewed and PVC's are new. Rest no significant differences when compared with EKG 01/2016. We dicussed possible etiologies. Cardiology evaluation recommended. Instructed about warning signs.  -     Ambulatory referral to Cardiology -     TSH  Cervical pain  ? Musculoskeletal, which could have been caused by prolonged positioning of neck while cath was placed. Instructed about warning signs. Topical OTC products, massage,and/or heat. ROM exercises. F/U as needed.  Hypokalemia  Further recommendations will be given according to lab results.  -     Potassium    -[name] was advised to seek immediate medical attention is chest pain, dizziness,diaphoresis,dyspnea,fever,MS changes among other warning signs. He voices understanding.      Abrar Koone G. Martinique, MD  [facility]. [place] office.

## 2016-06-11 NOTE — Patient Instructions (Signed)
A few things to remember from today's visit:   Essential hypertension  Bradycardia - Plan: Ambulatory referral to Cardiology  Cervical pain  Range of motion exercises.  Local heat, icy hot with Lidocaine.  If chest pain, clammy,dizzy,or short of breath call 911.  Monitor blood pressure and pulse.  Please be sure medication list is accurate. If a new problem present, please set up appointment sooner than planned today.

## 2016-06-12 DIAGNOSIS — N186 End stage renal disease: Secondary | ICD-10-CM | POA: Diagnosis not present

## 2016-06-12 DIAGNOSIS — E1029 Type 1 diabetes mellitus with other diabetic kidney complication: Secondary | ICD-10-CM | POA: Diagnosis not present

## 2016-06-12 DIAGNOSIS — T82598A Other mechanical complication of other cardiac and vascular devices and implants, initial encounter: Secondary | ICD-10-CM | POA: Diagnosis not present

## 2016-06-12 DIAGNOSIS — D631 Anemia in chronic kidney disease: Secondary | ICD-10-CM | POA: Diagnosis not present

## 2016-06-12 DIAGNOSIS — N2581 Secondary hyperparathyroidism of renal origin: Secondary | ICD-10-CM | POA: Diagnosis not present

## 2016-06-12 DIAGNOSIS — D689 Coagulation defect, unspecified: Secondary | ICD-10-CM | POA: Diagnosis not present

## 2016-06-12 LAB — TSH: TSH: 0.46 u[IU]/mL (ref 0.35–4.50)

## 2016-06-12 LAB — POTASSIUM: POTASSIUM: 4 meq/L (ref 3.5–5.1)

## 2016-06-13 ENCOUNTER — Ambulatory Visit: Payer: Medicare Other | Admitting: Physical Therapy

## 2016-06-13 ENCOUNTER — Encounter: Payer: Self-pay | Admitting: Physical Therapy

## 2016-06-13 VITALS — BP 192/98 | HR 54

## 2016-06-13 DIAGNOSIS — M6249 Contracture of muscle, multiple sites: Secondary | ICD-10-CM | POA: Diagnosis not present

## 2016-06-13 DIAGNOSIS — R2681 Unsteadiness on feet: Secondary | ICD-10-CM | POA: Diagnosis not present

## 2016-06-13 DIAGNOSIS — N186 End stage renal disease: Secondary | ICD-10-CM | POA: Diagnosis not present

## 2016-06-13 DIAGNOSIS — Z992 Dependence on renal dialysis: Secondary | ICD-10-CM | POA: Diagnosis not present

## 2016-06-13 DIAGNOSIS — R2689 Other abnormalities of gait and mobility: Secondary | ICD-10-CM | POA: Diagnosis not present

## 2016-06-13 DIAGNOSIS — C9000 Multiple myeloma not having achieved remission: Secondary | ICD-10-CM | POA: Diagnosis not present

## 2016-06-13 DIAGNOSIS — M6281 Muscle weakness (generalized): Secondary | ICD-10-CM | POA: Diagnosis not present

## 2016-06-13 NOTE — Therapy (Signed)
[facility] [place] [address] [place], [place], [address] Phone: [phone]   Fax:  [phone]  Physical Therapy Treatment  Patient Details  Name: Jack [name] MRN: [id number] Date of Birth: 1945-12-11 Referring Provider: Meridee Score MD   Encounter Date: 06/13/2016      PT End of Session - 06/13/16 0844    Visit Number 4   Number of Visits 18   Date for PT Re-Evaluation 07/26/16   Authorization Type Medicare & G-codes every 10th visit    PT Start Time 0804   PT Stop Time 0844   PT Time Calculation (min) 40 min   Equipment Utilized During Treatment Gait belt   Activity Tolerance Patient tolerated treatment well   Behavior During Therapy [facility] for tasks assessed/performed      Past Medical History:  Diagnosis Date  . Anemia   . Asthma   . Contracture of joint    left knee  . Decubitus ulcer of sacral region, stage 2 03/07/2014  . Diabetes mellitus with peripheral vascular disease ([place])   . End-stage renal disease on hemodialysis [facility])    Started HD March 2014.  Cause of ESRD was DM.  Gets HD at [facility] on [place] on MWF schedule.   Marland Kitchen ESRD on hemodialysis ([place])   . Gangrene ([employer])    right BKA  . Gangrene of foot ([place])   . Glaucoma   . Glaucoma 03/07/2014  . Heart murmur   . History of MRSA infection 04/22/2013   Bacteremia assoc w L foot wound infection Mar 2015   . Hyperparathyroidism, secondary renal ([place])   . Hypertension   . Hypothyroidism   . MRSA bacteremia   . Multiple myeloma, without mention of having achieved remission 03/30/2012   Cytogenetic neg on 03/23/2012.  Marland Kitchen Peripheral arterial disease ([place])   . Peripheral vascular disease, unspecified ([place]) 11/19/2012   In the past had R foot toe amps then R TMA. In 2015 had left foot toe amp > then TMA >then L BKA on 05/14/13   . Pneumonia    2012  . Thyroid disease    hyperparathyroidism    Past Surgical History:  Procedure Laterality Date   . ABDOMINAL AORTAGRAM Bilateral 11/06/2012   Procedure: ABDOMINAL AORTAGRAM;  Surgeon: Elam Dutch, MD;  Location: [facility] CATH LAB;  Service: Cardiovascular;  Laterality: Bilateral;  . AMPUTATION Right 11/10/2012   Procedure: AMPUTATION FIRST and SECOND TOES Right Foot;  Surgeon: Elam Dutch, MD;  Location: [place];  Service: Vascular;  Laterality: Right;  . AMPUTATION Left 04/07/2013   Procedure: AMPUTATION DIGIT- LEFT 1ST TOE;  Surgeon: Mal Misty, MD;  Location: [place];  Service: Vascular;  Laterality: Left;  . AMPUTATION Left 04/26/2013   Procedure: Left Foot Transmetatarsal Amputation;  Surgeon: Newt Minion, MD;  Location: [place];  Service: Orthopedics;  Laterality: Left;  . AMPUTATION Left 05/14/2013   Procedure: AMPUTATION BELOW KNEE;  Surgeon: Newt Minion, MD;  Location: [place];  Service: Orthopedics;  Laterality: Left;  Left Below Knee Amputation  . AMPUTATION Left 07/23/2013   Procedure: AMPUTATION BELOW KNEE;  Surgeon: Newt Minion, MD;  Location: [place];  Service: Orthopedics;  Laterality: Left;  Left Below Knee Amputation Revision  . AMPUTATION Right 02/18/2014   Procedure: AMPUTATION BELOW KNEE;  Surgeon: Newt Minion, MD;  Location: [place];  Service: Orthopedics;  Laterality: Right;  . AV FISTULA PLACEMENT Left 03/25/2012   Procedure: ARTERIOVENOUS (AV)  FISTULA CREATION;  Surgeon: Mal Misty, MD;  Location: [place];  Service: Vascular;  Laterality: Left;  . BELOW KNEE LEG AMPUTATION Left 05/14/2013   DR [name]  . BELOW KNEE LEG AMPUTATION Right 02/18/2014   dr duda  . CARDIAC CATHETERIZATION     approx 30 years ago  . CERVICAL DISC SURGERY    . EYE SURGERY Bilateral    CATARACTS  . I&D EXTREMITY Left 04/22/2013   Procedure: IRRIGATION AND DEBRIDEMENT LEFT FIRST TOE AMPUTATION WOUND ;  Surgeon: Mal Misty, MD;  Location: [place];  Service: Vascular;  Laterality: Left;  . I&D EXTREMITY Right 01/05/2014   Procedure: IRRIGATION AND DEBRIDEMENT Right Heel Ulcer;  Surgeon:  Mcarthur Rossetti, MD;  Location: [place];  Service: Orthopedics;  Laterality: Right;  Surgeon Available after 5PM  . INSERTION OF DIALYSIS CATHETER Right 03/19/2012   Procedure: INSERTION OF DIALYSIS CATHETER;  Surgeon: Mal Misty, MD;  Location: [place];  Service: Vascular;  Laterality: Right;  Right Internal Jugular  . LIGATION OF COMPETING BRANCHES OF ARTERIOVENOUS FISTULA Left 05/08/2012   Procedure: LIGATION OF COMPETING BRANCHES OF ARTERIOVENOUS FISTULA;  Surgeon: Mal Misty, MD;  Location: [place];  Service: Vascular;  Laterality: Left;  Ultrasound guided  . REPAIR QUADRICEPS/HAMSTRING MUSCLES Left 05/20/2014   Procedure: Left Hamstring Release;  Surgeon: Newt Minion, MD;  Location: [place];  Service: Orthopedics;  Laterality: Left;  . REVISON OF ARTERIOVENOUS FISTULA Left 01/30/2016   Procedure: CEPHALIC VEIN TURNDOWN TO LEFT UPPER ARM;  Surgeon: Elam Dutch, MD;  Location: [place];  Service: Vascular;  Laterality: Left;  . STUMP REVISION Right 05/20/2014   Procedure: Revision Right Below Knee Amputation;  Surgeon: Newt Minion, MD;  Location: [place];  Service: Orthopedics;  Laterality: Right;  . TEE WITHOUT CARDIOVERSION N/A 04/20/2013   Procedure: TRANSESOPHAGEAL ECHOCARDIOGRAM (TEE);  Surgeon: Josue Hector, MD;  Location[facility] ENDOSCOPY;  Service: Cardiovascular;  Laterality: N/A;  . THYROIDECTOMY    . TOE AMPUTATION     D/C 04-30-13  . TRANSMETATARSAL AMPUTATION Left 12/16/2012   Procedure: TRANSMETATARSAL AMPUTATION AND VAC PLACEMENT;  Surgeon: Elam Dutch, MD;  Location: [facility] OR;  Service: Vascular;  Laterality: Left;    Vitals:   06/13/16 0810 06/13/16 0843  BP: (!) 183/91 (!) 192/98  Pulse: 72 (!) 54  SpO2: 100% 96%        Subjective Assessment - 06/13/16 0810    Subjective Has had issues with dialysis. Started with a clotted dialysis catheter and had temporary catheter placed in chest. Now having cardiac issues with low heart rate. Waiting for cardiac clearance before  having surgery for new permanent catheter placed. HR dropped into the 30's with dialysis. Runs 60-80 outside of dialysis. Was okayed by ED doctor to keep going to gym and PT per pt and daughter report.                           Patient is accompained by: Family member  daughterTommi Rumps   Pertinent History bilateral transtibial amputations, anemia, asthma, contracture of L knee, decubitus ulcer of sacral region, DM with PVD, ESRD, glaucoma, heart murmur, HTN, hypothyroidism, multiple myeloma, PAD   Limitations Lifting;Standing;Walking;House hold activities   Patient Stated Goals walk without a person assisting him (walk with device only)    Currently in Pain? No/denies   Pain Score 0-No pain  [place] Adult PT Treatment/Exercise - 06/13/16 0818      Transfers   Transfers Sit to Stand;Stand to Sit   Sit to Stand 4: Min guard;With upper extremity assist;From chair/3-in-1   Sit to Stand Details Verbal cues for precautions/safety;Verbal cues for safe use of DME/AE;Verbal cues for technique   Sit to Stand Details (indicate cue type and reason) cues to scoot to edge of chair and for weight shifting   Stand to Sit 4: Min guard;With upper extremity assist;To chair/3-in-1;With armrests   Stand to Sit Details (indicate cue type and reason) Verbal cues for technique;Verbal cues for precautions/safety;Verbal cues for safe use of DME/AE   Stand to Sit Details cues to reach back after fully turning toward chair     Ambulation/Gait   Ambulation/Gait Yes   Ambulation/Gait Assistance 4: Min guard;4: Min assist   Ambulation/Gait Assistance Details cues for posture, step length and rollator position with gait.,    Ambulation Distance (Feet) 40 Feet   Assistive device Rollator   Gait Pattern Step-through pattern;Decreased step length - right;Decreased step length - left;Decreased stride length;Right flexed knee in stance;Left flexed knee in stance;Trunk flexed;Wide base of support;Poor foot  clearance - left;Poor foot clearance - right   Ambulation Surface Level;Indoor               PT Short Term Goals - 05/28/16 1300      PT SHORT TERM GOAL #1   Title Patient will verbalize understanding and return demonstration of initial HEP. (TARGET DATE: 06/28/2016)    Time 1   Period Months   Status New     PT SHORT TERM GOAL #2   Title Patient will demonstrate ability to reach 5 inches anteriorly and reach within 10 inches from the floor unilateral UE support on RW with supervision to indicate a decrease in his risk of falling. (TARGET DATE: 06/28/2016)    Time 1   Period Months   Status New     PT SHORT TERM GOAL #3   Title PT will perform TUG, and short term goal will be set. (TARGET DATE: 06/28/2016)    Time 1   Period Months   Status New     PT SHORT TERM GOAL #4   Title Patient will demonstrate ability to ambulate 150 feet on indoor surfaces with rollator style walker and supervision to indicate a decrease in his risk of falling. (TARGET DATE: 06/28/2016)   Time 1   Period Months   Status New           PT Long Term Goals - 05/28/16 1249      PT LONG TERM GOAL #1   Title Patient will verbalize understanding and return demonstration of ongoing HEP program. (TARGET DATE: 07/26/2016)    Time 2   Period Months   Status New     PT LONG TERM GOAL #2   Title Patient will ambulate 500 feet on firm, paved outdoor surfaces with family support to indicate a decrease risk of falling when returning to community ambulation with family support. (TARGET DATE: 07/26/2016)    Time 2   Period Months   Status New     PT LONG TERM GOAL #3   Title Patient will be able to ambulate 100 feet with rollator style walker with mod I to indicate a decrease in his risk of falling while ambulating within his house. (TARGET DATE: 07/26/2016)    Time 2   Period Months   Status New  PT LONG TERM GOAL #4   Title Patient will be able to ambulate over ramp/curb with rollator style walker  with family assistance to indicate a decrease in his risk of falling when returning to community ambulation with family. (TARGET DATE: 07/26/2016)    Time 2   Period Months   Status New     PT LONG TERM GOAL #5   Title Patient will demonstrate ability to reach 8 inches anteriorly and reach within 8 inches from the floor unilateral UE support on RW with supervision to indicate a decrease in his risk of falling. (TARGET DATE: 06/28/2016)    Time 2   Period Months   Status New            Plan - 06/13/16 0844    Clinical Impression Statement Today's skilled session addressed pt's cardiac response to mobility due to recent medical issues pt reported have been occuring. Pt's heart rate dropped (70's down to 50's) , BP elevated from 183/91 to 192/98 and SaO2 levels also dropped while remaining above 90% after short, house hold distance of 40 feet with rollator. Pt advised to hold on all exercises/mobility other than what is required such as pivot transfers at this time until cleared by cardiolgy due to abnormal cardiac responses with minimal mobility today. Pt/daughter verbalized understanding of recommendations. Pt to being using BSC again as well due to unable to access bathroom at wheelchair level. Did advise pt to continue to wear prostheses due to the assist they provide with transfers. Pt to call once he know which cardiologist he will be seeing so PT can route today's note with readings.                           Rehab Potential Good   Clinical Impairments Affecting Rehab Potential bilateral transtibial amputations, anemia, asthma, contracture of L knee, decubitus ulcer of sacral region, DM with PVD, ESRD, glaucoma, heart murmur, HTN, hypothyroidism, multiple myeloma, PAD   PT Frequency 2x / week   PT Duration Other (comment)  9 weeks    PT Treatment/Interventions ADLs/Self Care Home Management;Neuromuscular re-education;Balance training;Therapeutic exercise;Therapeutic activities;Functional  mobility training;Stair training;Gait training;DME Instruction;Patient/family education;Prosthetic Training;Manual techniques;Scar mobilization;Passive range of motion   PT Next Visit Plan on hold until cleared by cardiology. continued to work on LE stretching/strengthening, activity tolerance, gait with rollator   Consulted and Agree with Plan of Care Patient;Family member/caregiver  daughter [name]       Patient will benefit from skilled therapeutic intervention in order to improve the following deficits and impairments:  Abnormal gait, Decreased activity tolerance, Decreased balance, Decreased coordination, Decreased range of motion, Decreased safety awareness, Decreased knowledge of use of DME, Decreased knowledge of precautions, Decreased endurance, Decreased strength, Difficulty walking, Prosthetic Dependency, Postural dysfunction  Visit Diagnosis: Muscle weakness (generalized)  Unsteadiness on feet  Other abnormalities of gait and mobility     Problem List Patient Active Problem List   Diagnosis Date Noted  . Dyslipidemia 11/21/2015  . Diabetic retinopathy ([employer])- left eye 10/02/2015  . Complications, amputation stump late ([place]) 05/20/2014  . Weakness generalized 04/07/2014  . Sepsis ([place]) 04/07/2014  . Decubitus ulcer, stage II 04/07/2014  . Decubitus ulcer of ankle   . Fatigue   . Wound infection 04/06/2014  . Decubitus ulcer of sacral region, stage 2 03/07/2014  . Glaucoma 03/07/2014  . Gout 03/07/2014  . Below knee amputation status ([place]) 02/18/2014  . Osteomyelitis ([place]) 01/04/2014  .  Phantom limb ([place]) 12/14/2013  . Type 2 diabetes mellitus with diabetic foot infection ([place])   . Diabetes mellitus with peripheral vascular disease ([place])   . Asthma 05/17/2013  . History of MRSA infection 04/22/2013  . Peripheral vascular disease ([place]) 11/19/2012  . End-stage renal disease on hemodialysis ([place]) 05/05/2012  . Kahler disease ([place]) 03/30/2012  . Anemia in chronic  kidney disease 03/19/2012  . Hypothyroidism following radioiodine therapy 03/19/2012  . Anemia, iron deficiency 03/19/2012  . Hypertension 03/18/2012  . Chronic kidney disease (CKD), stage V ([place]) 03/18/2012  . Cardiac conduction disorder 03/18/2012  . MGUS (monoclonal gammopathy of unknown significance) 02/28/2011  . Monoclonal paraproteinemia 02/28/2011    Willow Ora, PTA, [facility] [address], [place], [place] [address] [phone] 06/13/16, 8:43 PM   Name: Jack [name] MRN: [id number] Date of Birth: 11-12-1945

## 2016-06-14 DIAGNOSIS — N186 End stage renal disease: Secondary | ICD-10-CM | POA: Diagnosis not present

## 2016-06-14 DIAGNOSIS — D689 Coagulation defect, unspecified: Secondary | ICD-10-CM | POA: Diagnosis not present

## 2016-06-14 DIAGNOSIS — D631 Anemia in chronic kidney disease: Secondary | ICD-10-CM | POA: Diagnosis not present

## 2016-06-14 DIAGNOSIS — N2581 Secondary hyperparathyroidism of renal origin: Secondary | ICD-10-CM | POA: Diagnosis not present

## 2016-06-14 DIAGNOSIS — T82598A Other mechanical complication of other cardiac and vascular devices and implants, initial encounter: Secondary | ICD-10-CM | POA: Diagnosis not present

## 2016-06-14 DIAGNOSIS — E1029 Type 1 diabetes mellitus with other diabetic kidney complication: Secondary | ICD-10-CM | POA: Diagnosis not present

## 2016-06-17 ENCOUNTER — Ambulatory Visit (INDEPENDENT_AMBULATORY_CARE_PROVIDER_SITE_OTHER): Payer: Medicare Other | Admitting: Cardiology

## 2016-06-17 ENCOUNTER — Encounter: Payer: Self-pay | Admitting: Cardiology

## 2016-06-17 VITALS — BP 130/70 | HR 48 | Ht 70.0 in | Wt 163.0 lb

## 2016-06-17 DIAGNOSIS — R001 Bradycardia, unspecified: Secondary | ICD-10-CM

## 2016-06-17 DIAGNOSIS — N186 End stage renal disease: Secondary | ICD-10-CM

## 2016-06-17 DIAGNOSIS — T82598A Other mechanical complication of other cardiac and vascular devices and implants, initial encounter: Secondary | ICD-10-CM | POA: Diagnosis not present

## 2016-06-17 DIAGNOSIS — E1029 Type 1 diabetes mellitus with other diabetic kidney complication: Secondary | ICD-10-CM | POA: Diagnosis not present

## 2016-06-17 DIAGNOSIS — D689 Coagulation defect, unspecified: Secondary | ICD-10-CM | POA: Diagnosis not present

## 2016-06-17 DIAGNOSIS — I493 Ventricular premature depolarization: Secondary | ICD-10-CM

## 2016-06-17 DIAGNOSIS — N2581 Secondary hyperparathyroidism of renal origin: Secondary | ICD-10-CM | POA: Diagnosis not present

## 2016-06-17 DIAGNOSIS — Z89511 Acquired absence of right leg below knee: Secondary | ICD-10-CM

## 2016-06-17 DIAGNOSIS — Z89512 Acquired absence of left leg below knee: Secondary | ICD-10-CM | POA: Diagnosis not present

## 2016-06-17 DIAGNOSIS — D631 Anemia in chronic kidney disease: Secondary | ICD-10-CM | POA: Diagnosis not present

## 2016-06-17 NOTE — Progress Notes (Signed)
Cardiology Office Note:    Date:  06/17/2016   ID:  Jack [name], Jack [name] November 18, 1945, MRN [id number]  PCP:  Martinique, Betty G, MD  Cardiologist:  Candee Furbish, MD    Referring MD: Martinique, Betty G, MD     History of Present Illness:    Jack [name] is a 71 y.o. male here for evaluation of bradycardia at the request of Dr. Betty Martinique  Review of prior notes, he was sent to the emergency room from dialysis center on 06/07/16 because heart rates were in the mid 40s, he was not having any symptoms. During dialysis he has been in the 40s to 50s in the past and sometimes may feel some mild left intrascapular pain but is asymptomatic.  An EKG was performed showing first-degree AV block with left anterior fascicular block. Troponins were normal. TSH was normal  He takes Midodrin to help support blood pressures. Otherwise, he is not on any AV nodal blocking agents.  PVCs were previously noted.  Once again asymptomatic, no syncope. No dizziness.  This may have started after his new dialysis catheter was placed. His left AV shunt has not been functional. He is waiting to get a new shunt placed. He is concerned about risks of surgery.  Past Medical History:  Diagnosis Date  . Anemia   . Asthma   . Contracture of joint    left knee  . Decubitus ulcer of sacral region, stage 2 03/07/2014  . Diabetes mellitus with peripheral vascular disease ([place])   . End-stage renal disease on hemodialysis [facility])    Started HD March 2014.  Cause of ESRD was DM.  Gets HD at [facility] on [place] on MWF schedule.   Marland Kitchen ESRD on hemodialysis ([place])   . Gangrene ([place])    right BKA  . Gangrene of foot ([place])   . Glaucoma   . Glaucoma 03/07/2014  . Heart murmur   . History of MRSA infection 04/22/2013   Bacteremia assoc w L foot wound infection Mar 2015   . Hyperparathyroidism, secondary renal ([place])   . Hypertension   . Hypothyroidism   . MRSA bacteremia   . Multiple myeloma, without mention of  having achieved remission 03/30/2012   Cytogenetic neg on 03/23/2012.  Marland Kitchen Peripheral arterial disease ([place])   . Peripheral vascular disease, unspecified ([place]) 11/19/2012   In the past had R foot toe amps then R TMA. In 2015 had left foot toe amp > then TMA >then L BKA on 05/14/13   . Pneumonia    2012  . Thyroid disease    hyperparathyroidism    Past Surgical History:  Procedure Laterality Date  . ABDOMINAL AORTAGRAM Bilateral 11/06/2012   Procedure: ABDOMINAL AORTAGRAM;  Surgeon: Elam Dutch, MD;  Location: [facility] CATH LAB;  Service: Cardiovascular;  Laterality: Bilateral;  . AMPUTATION Right 11/10/2012   Procedure: AMPUTATION FIRST and SECOND TOES Right Foot;  Surgeon: Elam Dutch, MD;  Location: [place];  Service: Vascular;  Laterality: Right;  . AMPUTATION Left 04/07/2013   Procedure: AMPUTATION DIGIT- LEFT 1ST TOE;  Surgeon: Mal Misty, MD;  Location: [facility];  Service: Vascular;  Laterality: Left;  . AMPUTATION Left 04/26/2013   Procedure: Left Foot Transmetatarsal Amputation;  Surgeon: Newt Minion, MD;  Location: [place];  Service: Orthopedics;  Laterality: Left;  . AMPUTATION Left 05/14/2013   Procedure: AMPUTATION BELOW KNEE;  Surgeon: Newt Minion, MD;  Location: [place];  Service: Orthopedics;  Laterality: Left;  Left Below Knee Amputation  . AMPUTATION Left 07/23/2013   Procedure: AMPUTATION BELOW KNEE;  Surgeon: Newt Minion, MD;  Location: [place];  Service: Orthopedics;  Laterality: Left;  Left Below Knee Amputation Revision  . AMPUTATION Right 02/18/2014   Procedure: AMPUTATION BELOW KNEE;  Surgeon: Newt Minion, MD;  Location: [place];  Service: Orthopedics;  Laterality: Right;  . AV FISTULA PLACEMENT Left 03/25/2012   Procedure: ARTERIOVENOUS (AV) FISTULA CREATION;  Surgeon: Mal Misty, MD;  Location: [place];  Service: Vascular;  Laterality: Left;  . BELOW KNEE LEG AMPUTATION Left 05/14/2013   DR [name]  . BELOW KNEE LEG AMPUTATION Right 02/18/2014   dr duda  . CARDIAC  CATHETERIZATION     approx 30 years ago  . CERVICAL DISC SURGERY    . EYE SURGERY Bilateral    CATARACTS  . I&D EXTREMITY Left 04/22/2013   Procedure: IRRIGATION AND DEBRIDEMENT LEFT FIRST TOE AMPUTATION WOUND ;  Surgeon: Mal Misty, MD;  Location: [facility];  Service: Vascular;  Laterality: Left;  . I&D EXTREMITY Right 01/05/2014   Procedure: IRRIGATION AND DEBRIDEMENT Right Heel Ulcer;  Surgeon: Mcarthur Rossetti, MD;  Location: [place];  Service: Orthopedics;  Laterality: Right;  Surgeon Available after 5PM  . INSERTION OF DIALYSIS CATHETER Right 03/19/2012   Procedure: INSERTION OF DIALYSIS CATHETER;  Surgeon: Mal Misty, MD;  Location: [place];  Service: Vascular;  Laterality: Right;  Right Internal Jugular  . LIGATION OF COMPETING BRANCHES OF ARTERIOVENOUS FISTULA Left 05/08/2012   Procedure: LIGATION OF COMPETING BRANCHES OF ARTERIOVENOUS FISTULA;  Surgeon: Mal Misty, MD;  Location: [place];  Service: Vascular;  Laterality: Left;  Ultrasound guided  . REPAIR QUADRICEPS/HAMSTRING MUSCLES Left 05/20/2014   Procedure: Left Hamstring Release;  Surgeon: Newt Minion, MD;  Location: [place];  Service: Orthopedics;  Laterality: Left;  . REVISON OF ARTERIOVENOUS FISTULA Left 01/30/2016   Procedure: CEPHALIC VEIN TURNDOWN TO LEFT UPPER ARM;  Surgeon: Elam Dutch, MD;  Location: [place];  Service: Vascular;  Laterality: Left;  . STUMP REVISION Right 05/20/2014   Procedure: Revision Right Below Knee Amputation;  Surgeon: Newt Minion, MD;  Location: [place];  Service: Orthopedics;  Laterality: Right;  . TEE WITHOUT CARDIOVERSION N/A 04/20/2013   Procedure: TRANSESOPHAGEAL ECHOCARDIOGRAM (TEE);  Surgeon: Josue Hector, MD;  Location: [facility] ENDOSCOPY;  Service: Cardiovascular;  Laterality: N/A;  . THYROIDECTOMY    . TOE AMPUTATION     D/C 04-30-13  . TRANSMETATARSAL AMPUTATION Left 12/16/2012   Procedure: TRANSMETATARSAL AMPUTATION AND VAC PLACEMENT;  Surgeon: Elam Dutch, MD;  Location: [facility] OR;   Service: Vascular;  Laterality: Left;    Current Medications: Current Meds  Medication Sig  . aspirin EC 81 MG tablet Take 81 mg by mouth daily.  Marland Kitchen atorvastatin (LIPITOR) 10 MG tablet Take 1 tablet (10 mg total) by mouth daily.  . calcium acetate (PHOSLO) 667 MG capsule Take 1,334 mg by mouth 3 (three) times daily with meals.  . diphenhydrAMINE (BENADRYL) 25 MG tablet Take 100 mg by mouth as directed. The morning of fistula cleaning  . glipiZIDE (GLUCOTROL) 5 MG tablet Take 0.5 tablets (2.5 mg total) by mouth daily before breakfast.  . levothyroxine (SYNTHROID, LEVOTHROID) 150 MCG tablet TAKE 1 TABLET (150 MCG TOTAL) BY MOUTH DAILY.  . midodrine (PROAMATINE) 10 MG tablet Take 10 mg by mouth 3 (three) times a week. Mon, Wed, Fri before dialysis  . multivitamin (RENA-VIT) TABS tablet  Take 1 tablet by mouth at bedtime.  Marland Kitchen oxyCODONE-acetaminophen (ROXICET) 5-325 MG tablet Take 1 tablet by mouth every 12 (twelve) hours as needed for moderate pain or severe pain.  . predniSONE (DELTASONE) 20 MG tablet Take 40 mg by mouth. Take 28ms twice daily the day before fistula cleaning and 482m the day of the fistula cleaning     Allergies:   Bee venom; Lisinopril; Ivp dye [iodinated diagnostic agents]; and Morphine and related   Social History   Social History  . Marital status: Divorced    Spouse name: N/A  . Number of children: N/A  . Years of education: N/A   Social History Main Topics  . Smoking status: Former Smoker    Types: Cigarettes    Quit date: 03/19/1982  . Smokeless tobacco: Never Used  . Alcohol use No     Comment: formerly  . Drug use: No  . Sexual activity: No   Other Topics Concern  . None   Social History Narrative  . None     Family History: The patient's family history includes Cancer in his mother; Diabetes in his mother; Kidney disease in his father. ROS:   Please see the history of present illness.     All other systems reviewed and are  negative.  EKGs/Labs/Other Studies Reviewed:    The following studies were reviewed today: Prior lab work, EKG reviewed, blood work reviewed  ECHO 04/08/14:  - Left ventricle: LV function is vigorous with near cavity obliteration. The cavity size was normal. Wall thickness was increased in a pattern of moderate LVH. Systolic function was vigorous. The estimated ejection fraction was in the range of 65% to 70%. - Pericardium, extracardiac: A trivial pericardial effusion was identified.  EKG:  EKG is not ordered today.  Prior EKG shows sinus rhythm with 2 PVCs heart rate in the 70s personally viewed  Recent Labs: 06/07/2016: ALT 13; BUN 9; Creatinine, Ser 3.21; Hemoglobin 12.8; Platelets 233; Sodium 136 06/11/2016: Potassium 4.0; TSH 0.46   Recent Lipid Panel    Component Value Date/Time   CHOL 185 11/21/2015 0933   TRIG 114.0 11/21/2015 0933   HDL 45.60 11/21/2015 0933   CHOLHDL 4 11/21/2015 0933   VLDL 22.8 11/21/2015 0933   LDLCALC 116 (H) 11/21/2015 0933    Physical Exam:    VS:  BP 130/70   Pulse (!) 48   Ht '5\' 10"'  (1.778 m)   Wt 163 lb (73.9 kg)   BMI 23.39 kg/m     Wt Readings from Last 3 Encounters:  06/17/16 163 lb (73.9 kg)  02/29/16 170 lb (77.1 kg)  01/18/16 170 lb (77.1 kg)     GEN:  Well nourished, well developed in no acute distress HEENT: Normal NECK: No JVD; No carotid bruits, Right-sided dialysis catheter in place LYMPHATICS: No lymphadenopathy CARDIAC: RRR, 2/6 systolic murmur, rubs, gallops, positive ectopy RESPIRATORY:  Clear to auscultation without rales, wheezing or rhonchi  ABDOMEN: Soft, non-tender, non-distended MUSCULOSKELETAL:  No edema; double below knee amputation SKIN: Warm and dry NEUROLOGIC:  Alert and oriented x 3 PSYCHIATRIC:  Normal affect   ASSESSMENT:    1. Bradycardia   2. PVC's (premature ventricular contractions)   3. ESRD (end stage renal disease) ([facility]  4. Status post bilateral below knee amputation  ([facility]   PLAN:    In order of problems listed above:  PVCs  - His effective heart rate was recorded in the mid 30s because of ventricular bigeminy or  ventricular trigeminy. In other words, if he had frequent PVCs, the PVCs are non-blood pressure producing beats and a blood pressure cuff would illustrate bradycardia when in fact there happens to be a PVC present.  - Thankfully, he has not had any symptoms with this, no syncope.  - It is also possible that his newly placed dialysis catheter may be contributing to increased ectopy based upon catheter placement  - I would encourage him to have a new shunt/fistula placed in order to effectively be able to remove his dialysis catheter and reduce his risk of infection  - He may proceed with general anesthesia from my perspective. Previous echocardiogram showed normal ejection fraction. He is not having any anginal symptoms.  - We will check 24-hour Holter monitor to ensure that he does not have any significant pauses.  - Okay to proceed with rehab as well.  - He does not need a pacemaker.  Double amputation lower extremities  End-stage renal disease on hemodialysis  - This is his fifth year of dialysis. He states that most of his friends who started at the same time as he has have died.   Medication Adjustments/Labs and Tests Ordered: Current medicines are reviewed at length with the patient today.  Concerns regarding medicines are outlined above. Labs and tests ordered and medication changes are outlined in the patient instructions below:  Patient Instructions  Medication Instructions:  The current medical regimen is effective;  continue present plan and medications.  Testing/Procedures: Your physician has recommended that you wear a holter monitor. Holter monitors are medical devices that record the heart's electrical activity. Doctors most often use these monitors to diagnose arrhythmias. Arrhythmias are problems with the speed or rhythm of  the heartbeat. The monitor is a small, portable device. You can wear one while you do your normal daily activities. This is usually used to diagnose what is causing palpitations/syncope (passing out).  Follow-Up: Follow up as needed with Dr Marlou Porch.  If you need a refill on your cardiac medications before your next appointment, please call your pharmacy.  Thank you for choosing [facility]!!        Signed, Candee Furbish, MD  06/17/2016 2:54 PM    [place]

## 2016-06-17 NOTE — Patient Instructions (Signed)
Medication Instructions:  The current medical regimen is effective;  continue present plan and medications.  Testing/Procedures: Your physician has recommended that you wear a holter monitor. Holter monitors are medical devices that record the heart's electrical activity. Doctors most often use these monitors to diagnose arrhythmias. Arrhythmias are problems with the speed or rhythm of the heartbeat. The monitor is a small, portable device. You can wear one while you do your normal daily activities. This is usually used to diagnose what is causing palpitations/syncope (passing out).  Follow-Up: Follow up as needed with Dr Marlou Porch.  If you need a refill on your cardiac medications before your next appointment, please call your pharmacy.  Thank you for choosing [facility]!!

## 2016-06-19 DIAGNOSIS — E1029 Type 1 diabetes mellitus with other diabetic kidney complication: Secondary | ICD-10-CM | POA: Diagnosis not present

## 2016-06-19 DIAGNOSIS — N186 End stage renal disease: Secondary | ICD-10-CM | POA: Diagnosis not present

## 2016-06-19 DIAGNOSIS — T82598A Other mechanical complication of other cardiac and vascular devices and implants, initial encounter: Secondary | ICD-10-CM | POA: Diagnosis not present

## 2016-06-19 DIAGNOSIS — N2581 Secondary hyperparathyroidism of renal origin: Secondary | ICD-10-CM | POA: Diagnosis not present

## 2016-06-19 DIAGNOSIS — D689 Coagulation defect, unspecified: Secondary | ICD-10-CM | POA: Diagnosis not present

## 2016-06-19 DIAGNOSIS — D631 Anemia in chronic kidney disease: Secondary | ICD-10-CM | POA: Diagnosis not present

## 2016-06-20 ENCOUNTER — Ambulatory Visit: Payer: Medicare Other | Admitting: Physical Therapy

## 2016-06-21 DIAGNOSIS — D689 Coagulation defect, unspecified: Secondary | ICD-10-CM | POA: Diagnosis not present

## 2016-06-21 DIAGNOSIS — D631 Anemia in chronic kidney disease: Secondary | ICD-10-CM | POA: Diagnosis not present

## 2016-06-21 DIAGNOSIS — E1029 Type 1 diabetes mellitus with other diabetic kidney complication: Secondary | ICD-10-CM | POA: Diagnosis not present

## 2016-06-21 DIAGNOSIS — N186 End stage renal disease: Secondary | ICD-10-CM | POA: Diagnosis not present

## 2016-06-21 DIAGNOSIS — N2581 Secondary hyperparathyroidism of renal origin: Secondary | ICD-10-CM | POA: Diagnosis not present

## 2016-06-21 DIAGNOSIS — T82598A Other mechanical complication of other cardiac and vascular devices and implants, initial encounter: Secondary | ICD-10-CM | POA: Diagnosis not present

## 2016-06-24 DIAGNOSIS — E1029 Type 1 diabetes mellitus with other diabetic kidney complication: Secondary | ICD-10-CM | POA: Diagnosis not present

## 2016-06-24 DIAGNOSIS — D631 Anemia in chronic kidney disease: Secondary | ICD-10-CM | POA: Diagnosis not present

## 2016-06-24 DIAGNOSIS — N2581 Secondary hyperparathyroidism of renal origin: Secondary | ICD-10-CM | POA: Diagnosis not present

## 2016-06-24 DIAGNOSIS — N186 End stage renal disease: Secondary | ICD-10-CM | POA: Diagnosis not present

## 2016-06-24 DIAGNOSIS — D689 Coagulation defect, unspecified: Secondary | ICD-10-CM | POA: Diagnosis not present

## 2016-06-24 DIAGNOSIS — T82598A Other mechanical complication of other cardiac and vascular devices and implants, initial encounter: Secondary | ICD-10-CM | POA: Diagnosis not present

## 2016-06-26 DIAGNOSIS — D689 Coagulation defect, unspecified: Secondary | ICD-10-CM | POA: Diagnosis not present

## 2016-06-26 DIAGNOSIS — E1029 Type 1 diabetes mellitus with other diabetic kidney complication: Secondary | ICD-10-CM | POA: Diagnosis not present

## 2016-06-26 DIAGNOSIS — D631 Anemia in chronic kidney disease: Secondary | ICD-10-CM | POA: Diagnosis not present

## 2016-06-26 DIAGNOSIS — N2581 Secondary hyperparathyroidism of renal origin: Secondary | ICD-10-CM | POA: Diagnosis not present

## 2016-06-26 DIAGNOSIS — T82598A Other mechanical complication of other cardiac and vascular devices and implants, initial encounter: Secondary | ICD-10-CM | POA: Diagnosis not present

## 2016-06-26 DIAGNOSIS — N186 End stage renal disease: Secondary | ICD-10-CM | POA: Diagnosis not present

## 2016-06-27 ENCOUNTER — Encounter: Payer: Medicare Other | Admitting: Physical Therapy

## 2016-06-28 ENCOUNTER — Ambulatory Visit (INDEPENDENT_AMBULATORY_CARE_PROVIDER_SITE_OTHER): Payer: Medicare Other

## 2016-06-28 DIAGNOSIS — D631 Anemia in chronic kidney disease: Secondary | ICD-10-CM | POA: Diagnosis not present

## 2016-06-28 DIAGNOSIS — D689 Coagulation defect, unspecified: Secondary | ICD-10-CM | POA: Diagnosis not present

## 2016-06-28 DIAGNOSIS — N2581 Secondary hyperparathyroidism of renal origin: Secondary | ICD-10-CM | POA: Diagnosis not present

## 2016-06-28 DIAGNOSIS — T82598A Other mechanical complication of other cardiac and vascular devices and implants, initial encounter: Secondary | ICD-10-CM | POA: Diagnosis not present

## 2016-06-28 DIAGNOSIS — E1029 Type 1 diabetes mellitus with other diabetic kidney complication: Secondary | ICD-10-CM | POA: Diagnosis not present

## 2016-06-28 DIAGNOSIS — R001 Bradycardia, unspecified: Secondary | ICD-10-CM | POA: Diagnosis not present

## 2016-06-28 DIAGNOSIS — N186 End stage renal disease: Secondary | ICD-10-CM | POA: Diagnosis not present

## 2016-07-01 ENCOUNTER — Other Ambulatory Visit: Payer: Self-pay | Admitting: Family Medicine

## 2016-07-01 DIAGNOSIS — T82598A Other mechanical complication of other cardiac and vascular devices and implants, initial encounter: Secondary | ICD-10-CM | POA: Diagnosis not present

## 2016-07-01 DIAGNOSIS — N186 End stage renal disease: Secondary | ICD-10-CM | POA: Diagnosis not present

## 2016-07-01 DIAGNOSIS — E1029 Type 1 diabetes mellitus with other diabetic kidney complication: Secondary | ICD-10-CM | POA: Diagnosis not present

## 2016-07-01 DIAGNOSIS — E890 Postprocedural hypothyroidism: Secondary | ICD-10-CM

## 2016-07-01 DIAGNOSIS — N2581 Secondary hyperparathyroidism of renal origin: Secondary | ICD-10-CM | POA: Diagnosis not present

## 2016-07-01 DIAGNOSIS — D689 Coagulation defect, unspecified: Secondary | ICD-10-CM | POA: Diagnosis not present

## 2016-07-01 DIAGNOSIS — D631 Anemia in chronic kidney disease: Secondary | ICD-10-CM | POA: Diagnosis not present

## 2016-07-02 ENCOUNTER — Encounter: Payer: Medicare Other | Admitting: Physical Therapy

## 2016-07-03 DIAGNOSIS — N2581 Secondary hyperparathyroidism of renal origin: Secondary | ICD-10-CM | POA: Diagnosis not present

## 2016-07-03 DIAGNOSIS — D631 Anemia in chronic kidney disease: Secondary | ICD-10-CM | POA: Diagnosis not present

## 2016-07-03 DIAGNOSIS — T82598A Other mechanical complication of other cardiac and vascular devices and implants, initial encounter: Secondary | ICD-10-CM | POA: Diagnosis not present

## 2016-07-03 DIAGNOSIS — N186 End stage renal disease: Secondary | ICD-10-CM | POA: Diagnosis not present

## 2016-07-03 DIAGNOSIS — E1029 Type 1 diabetes mellitus with other diabetic kidney complication: Secondary | ICD-10-CM | POA: Diagnosis not present

## 2016-07-03 DIAGNOSIS — D689 Coagulation defect, unspecified: Secondary | ICD-10-CM | POA: Diagnosis not present

## 2016-07-04 ENCOUNTER — Encounter: Payer: Self-pay | Admitting: Vascular Surgery

## 2016-07-04 ENCOUNTER — Ambulatory Visit: Payer: Medicare Other | Admitting: Physical Therapy

## 2016-07-05 DIAGNOSIS — N2581 Secondary hyperparathyroidism of renal origin: Secondary | ICD-10-CM | POA: Diagnosis not present

## 2016-07-05 DIAGNOSIS — N186 End stage renal disease: Secondary | ICD-10-CM | POA: Diagnosis not present

## 2016-07-05 DIAGNOSIS — D689 Coagulation defect, unspecified: Secondary | ICD-10-CM | POA: Diagnosis not present

## 2016-07-05 DIAGNOSIS — D631 Anemia in chronic kidney disease: Secondary | ICD-10-CM | POA: Diagnosis not present

## 2016-07-05 DIAGNOSIS — T82598A Other mechanical complication of other cardiac and vascular devices and implants, initial encounter: Secondary | ICD-10-CM | POA: Diagnosis not present

## 2016-07-05 DIAGNOSIS — E1029 Type 1 diabetes mellitus with other diabetic kidney complication: Secondary | ICD-10-CM | POA: Diagnosis not present

## 2016-07-06 ENCOUNTER — Other Ambulatory Visit: Payer: Self-pay | Admitting: Family Medicine

## 2016-07-06 DIAGNOSIS — E890 Postprocedural hypothyroidism: Secondary | ICD-10-CM

## 2016-07-08 DIAGNOSIS — N186 End stage renal disease: Secondary | ICD-10-CM | POA: Diagnosis not present

## 2016-07-08 DIAGNOSIS — E1029 Type 1 diabetes mellitus with other diabetic kidney complication: Secondary | ICD-10-CM | POA: Diagnosis not present

## 2016-07-08 DIAGNOSIS — D631 Anemia in chronic kidney disease: Secondary | ICD-10-CM | POA: Diagnosis not present

## 2016-07-08 DIAGNOSIS — N2581 Secondary hyperparathyroidism of renal origin: Secondary | ICD-10-CM | POA: Diagnosis not present

## 2016-07-08 DIAGNOSIS — D689 Coagulation defect, unspecified: Secondary | ICD-10-CM | POA: Diagnosis not present

## 2016-07-08 DIAGNOSIS — T82598A Other mechanical complication of other cardiac and vascular devices and implants, initial encounter: Secondary | ICD-10-CM | POA: Diagnosis not present

## 2016-07-09 ENCOUNTER — Encounter: Payer: Self-pay | Admitting: Physical Therapy

## 2016-07-09 ENCOUNTER — Telehealth: Payer: Self-pay | Admitting: Physical Therapy

## 2016-07-09 ENCOUNTER — Ambulatory Visit: Payer: Medicare Other | Attending: Orthopedic Surgery | Admitting: Physical Therapy

## 2016-07-09 DIAGNOSIS — R2689 Other abnormalities of gait and mobility: Secondary | ICD-10-CM | POA: Insufficient documentation

## 2016-07-09 DIAGNOSIS — R2681 Unsteadiness on feet: Secondary | ICD-10-CM | POA: Insufficient documentation

## 2016-07-09 DIAGNOSIS — M6281 Muscle weakness (generalized): Secondary | ICD-10-CM | POA: Insufficient documentation

## 2016-07-09 DIAGNOSIS — M6249 Contracture of muscle, multiple sites: Secondary | ICD-10-CM

## 2016-07-09 NOTE — Telephone Encounter (Signed)
Order written and sent to [place]. Thank you for the information

## 2016-07-09 NOTE — Telephone Encounter (Signed)
Dr. Sharol Given,  Jack [name]'s received both of his prostheses July 2016 so they are 71 years old. I set up an appointment with you for Thursday, June 28 at 1:45. He is wearing 10+ ply socks & pads to modify current sockets. He needs new sockets & liners. His current prostheses have SACH feet (K1) & he would benefit from K2 feet.He has been ambulating on level surfaces at K1 level (household mobility) and we are progressing to K2 activities including negotiating stairs, ramps & curbs.  We tried the UpWalker that you recommended which is basically a rollator walker with bilateral platforms. This walker was too top heavy causing balance issues with Jack [name]. So we are training him with regular rollator walker.   Can you please prescribe new prostheses? With current Medicare requirements, please send copy of your office note justifying need for prostheses & prescription to [place]. Brooke Pace, CPO. FAX number is [phone].  Thank you Jamey Reas, PT, DPT

## 2016-07-09 NOTE — Therapy (Signed)
[facility] [address] [place], [place], [address] Phone: [phone]   Fax:  [phone]  Physical Therapy Treatment  Patient Details  Name: Jack [name] MRN: [id number] Date of Birth: [birth year] Referring Provider: Meridee Score MD   Encounter Date: 07/09/2016      PT End of Session - 07/09/16 0921    Visit Number 5   Number of Visits 18   Date for PT Re-Evaluation 07/26/16   Authorization Type Medicare & G-codes every 10th visit    PT Start Time 0825   PT Stop Time 0913   PT Time Calculation (min) 48 min   Equipment Utilized During Treatment Gait belt   Activity Tolerance Patient tolerated treatment well   Behavior During Therapy [facility] for tasks assessed/performed      Past Medical History:  Diagnosis Date  . Anemia   . Asthma   . Contracture of joint    left knee  . Decubitus ulcer of sacral region, stage 2 03/07/2014  . Diabetes mellitus with peripheral vascular disease ([place])   . End-stage renal disease on hemodialysis [facility])    Started HD March 2014.  Cause of ESRD was DM.  Gets HD at [facility] on [place] on MWF schedule.   Marland Kitchen ESRD on hemodialysis ([place])   . Gangrene ([place])    right BKA  . Gangrene of foot ([place])   . Glaucoma   . Glaucoma 03/07/2014  . Heart murmur   . History of MRSA infection 04/22/2013   Bacteremia assoc w L foot wound infection Mar 2015   . Hyperparathyroidism, secondary renal ([place])   . Hypertension   . Hypothyroidism   . MRSA bacteremia   . Multiple myeloma, without mention of having achieved remission 03/30/2012   Cytogenetic neg on 03/23/2012.  Marland Kitchen Peripheral arterial disease ([place])   . Peripheral vascular disease, unspecified ([place]) 11/19/2012   In the past had R foot toe amps then R TMA. In 2015 had left foot toe amp > then TMA >then L BKA on 05/14/13   . Pneumonia    2012  . Thyroid disease    hyperparathyroidism    Past Surgical History:  Procedure Laterality Date   . ABDOMINAL AORTAGRAM Bilateral 11/06/2012   Procedure: ABDOMINAL AORTAGRAM;  Surgeon: Elam Dutch, MD;  Location: [facility] CATH LAB;  Service: Cardiovascular;  Laterality: Bilateral;  . AMPUTATION Right 11/10/2012   Procedure: AMPUTATION FIRST and SECOND TOES Right Foot;  Surgeon: Elam Dutch, MD;  Location: [facility];  Service: Vascular;  Laterality: Right;  . AMPUTATION Left 04/07/2013   Procedure: AMPUTATION DIGIT- LEFT 1ST TOE;  Surgeon: Mal Misty, MD;  Location: [facility];  Service: Vascular;  Laterality: Left;  . AMPUTATION Left 04/26/2013   Procedure: Left Foot Transmetatarsal Amputation;  Surgeon: Newt Minion, MD;  Location: [facility];  Service: Orthopedics;  Laterality: Left;  . AMPUTATION Left 05/14/2013   Procedure: AMPUTATION BELOW KNEE;  Surgeon: Newt Minion, MD;  Location: [facility];  Service: Orthopedics;  Laterality: Left;  Left Below Knee Amputation  . AMPUTATION Left 07/23/2013   Procedure: AMPUTATION BELOW KNEE;  Surgeon: Newt Minion, MD;  Location: [place];  Service: Orthopedics;  Laterality: Left;  Left Below Knee Amputation Revision  . AMPUTATION Right 02/18/2014   Procedure: AMPUTATION BELOW KNEE;  Surgeon: Newt Minion, MD;  Location: [facility];  Service: Orthopedics;  Laterality: Right;  . AV FISTULA PLACEMENT Left 03/25/2012   Procedure: ARTERIOVENOUS (AV)  FISTULA CREATION;  Surgeon: Mal Misty, MD;  Location: [facility];  Service: Vascular;  Laterality: Left;  . BELOW KNEE LEG AMPUTATION Left 05/14/2013   DR [name]  . BELOW KNEE LEG AMPUTATION Right 02/18/2014   dr duda  . CARDIAC CATHETERIZATION     approx 30 years ago  . CERVICAL DISC SURGERY    . EYE SURGERY Bilateral    CATARACTS  . I&D EXTREMITY Left 04/22/2013   Procedure: IRRIGATION AND DEBRIDEMENT LEFT FIRST TOE AMPUTATION WOUND ;  Surgeon: Mal Misty, MD;  Location: [facility];  Service: Vascular;  Laterality: Left;  . I&D EXTREMITY Right 01/05/2014   Procedure: IRRIGATION AND DEBRIDEMENT Right Heel Ulcer;  Surgeon:  Mcarthur Rossetti, MD;  Location: [facility];  Service: Orthopedics;  Laterality: Right;  Surgeon Available after 5PM  . INSERTION OF DIALYSIS CATHETER Right 03/19/2012   Procedure: INSERTION OF DIALYSIS CATHETER;  Surgeon: Mal Misty, MD;  Location: [facility];  Service: Vascular;  Laterality: Right;  Right Internal Jugular  . LIGATION OF COMPETING BRANCHES OF ARTERIOVENOUS FISTULA Left 05/08/2012   Procedure: LIGATION OF COMPETING BRANCHES OF ARTERIOVENOUS FISTULA;  Surgeon: Mal Misty, MD;  Location: [facility];  Service: Vascular;  Laterality: Left;  Ultrasound guided  . REPAIR QUADRICEPS/HAMSTRING MUSCLES Left 05/20/2014   Procedure: Left Hamstring Release;  Surgeon: Newt Minion, MD;  Location: [facility];  Service: Orthopedics;  Laterality: Left;  . REVISON OF ARTERIOVENOUS FISTULA Left 01/30/2016   Procedure: CEPHALIC VEIN TURNDOWN TO LEFT UPPER ARM;  Surgeon: Elam Dutch, MD;  Location: [facility];  Service: Vascular;  Laterality: Left;  . STUMP REVISION Right 05/20/2014   Procedure: Revision Right Below Knee Amputation;  Surgeon: Newt Minion, MD;  Location: [facility];  Service: Orthopedics;  Laterality: Right;  . TEE WITHOUT CARDIOVERSION N/A 04/20/2013   Procedure: TRANSESOPHAGEAL ECHOCARDIOGRAM (TEE);  Surgeon: Josue Hector, MD;  Location[facility] ENDOSCOPY;  Service: Cardiovascular;  Laterality: N/A;  . THYROIDECTOMY    . TOE AMPUTATION     D/C 04-30-13  . TRANSMETATARSAL AMPUTATION Left 12/16/2012   Procedure: TRANSMETATARSAL AMPUTATION AND VAC PLACEMENT;  Surgeon: Elam Dutch, MD;  Location: [facility];  Service: Vascular;  Laterality: Left;    There were no vitals filed for this visit.      Subjective Assessment - 07/09/16 0829    Subjective He burned his right middle & index fingers on stove. He has been wearing prostheses but not walking until he gets clearance. He saw caridiologist who feels low heart rate is actually automaticated machines missing some beats with low BP.    Patient is  accompained by: Family member   Pertinent History bilateral transtibial amputations, anemia, asthma, contracture of L knee, decubitus ulcer of sacral region, DM with PVD, ESRD, glaucoma, heart murmur, HTN, hypothyroidism, multiple myeloma, PAD   Limitations Lifting;Standing;Walking;House hold activities   Patient Stated Goals walk without a person assisting him (walk with device only)    Currently in Pain? Yes   Pain Score 9    Pain Location Hand   Pain Orientation Right;Left   Pain Descriptors / Indicators Aching   Pain Onset In the past 7 days     Vital Signs: BP 166/86, SpO2 98%. HR 64 After 140' gait, HR 80. After stairs & curb HR 80 & 88bpm with dyspnea 3/4.                       [facility] Adult  PT Treatment/Exercise - 07/09/16 0830      Transfers   Transfers Sit to Stand;Stand to Sit   Sit to Stand 4: Min guard;With upper extremity assist;From chair/3-in-1  to rollator   Sit to Stand Details Verbal cues for precautions/safety;Verbal cues for safe use of DME/AE;Verbal cues for technique   Stand to Sit 4: Min guard;With upper extremity assist;To chair/3-in-1;With armrests  from rollator   Stand to Sit Details (indicate cue type and reason) Verbal cues for technique;Verbal cues for precautions/safety;Verbal cues for safe use of DME/AE   Stand Pivot Transfers 3: Mod assist  turning 180* to sit /stand rollator seat   Stand Pivot Transfer Details (indicate cue type and reason) Verbal & manual cues on technique to sit & stand from rollator seat.      Ambulation/Gait   Ambulation/Gait Yes   Ambulation/Gait Assistance 4: Min guard   Ambulation/Gait Assistance Details verbal & manual cues on posture, rollator control & step length.    Ambulation Distance (Feet) 130 Feet   Assistive device Rollator;Prostheses   Gait Pattern Step-through pattern;Decreased step length - right;Decreased step length - left;Decreased stride length;Right flexed knee in stance;Left flexed knee in  stance;Trunk flexed;Wide base of support;Poor foot clearance - left;Poor foot clearance - right   Ambulation Surface Indoor;Level   Stairs Yes   Stairs Assistance 2: Max assist  2 people for safety   Stairs Assistance Details (indicate cue type and reason) demo & verbal cues on technique with bil. TTA prostheses; alternating lead LE   Stair Management Technique Two rails;Step to pattern;Forwards   Number of Stairs 4  seated rest on stool at top   Curb 2: Max assist  2 people for safety   Curb Details (indicate cue type and reason) demo & verbal cues on technique with bil. TTA prostheses & with rollator walker     Prosthetics   Prosthetic Care Comments  PT set up appt with Dr. Sharol Given this Thursday, 07/11/16 for socket revision justification   Current prosthetic wear tolerance (days/week)  daily    Current prosthetic wear tolerance (#hours/day)  all awake hours every day   Residual limb condition  R residual limb and L residual limb: darkening of color along incision line and along distal tibia. Normal temperature, moisture, and hair growth.                  PT Short Term Goals - 07/09/16 0919      PT SHORT TERM GOAL #1   Title Patient will verbalize understanding and return demonstration of initial HEP. (TARGET DATE: 06/28/2016)    Baseline Partially MET Pt verbalized & demonstrated basic understanding of initial HEP but needs further instruction   Time 1   Period Months   Status Partially Met     PT SHORT TERM GOAL #2   Title Patient will demonstrate ability to reach 5 inches anteriorly and reach within 10 inches from the floor unilateral UE support on RW with supervision to indicate a decrease in his risk of falling. (TARGET DATE: 06/28/2016)  NEW Target 07/26/2016     Baseline NOT MET 07/09/2016 Patient missed 4 weeks of PT care for cardiac work-up so limited progress.    Time 1   Period Months   Status On-going     PT SHORT TERM GOAL #3   Title PT will perform TUG, and  short term goal will be set. (TARGET DATE: 06/28/2016) NEW Target Date 07/26/2016   Baseline NOT MET 07/09/2016 Patient missed  4 weeks of PT care for cardiac work-up so limited progress.    Time 1   Period Months   Status On-going     PT SHORT TERM GOAL #4   Title Patient will demonstrate ability to ambulate 150 feet on indoor surfaces with rollator style walker and supervision to indicate a decrease in his risk of falling. (TARGET DATE: 06/28/2016)  New Target Date 07/26/2016   Baseline NOT MET 07/09/2016 Patient missed 4 weeks of PT care for cardiac work-up so limited progress.    Time 1   Period Months   Status On-going           PT Long Term Goals - 07/09/16 0926      PT LONG TERM GOAL #1   Title Patient will verbalize understanding and return demonstration of ongoing HEP program. (TARGET DATE: 07/26/2016)    Time 2   Period Months   Status On-going     PT LONG TERM GOAL #2   Title Patient will ambulate 500 feet on firm, paved outdoor surfaces with family support to indicate a decrease risk of falling when returning to community ambulation with family support. (TARGET DATE: 07/26/2016)    Time 2   Period Months   Status On-going     PT LONG TERM GOAL #3   Title Patient will be able to ambulate 100 feet with rollator style walker with mod I to indicate a decrease in his risk of falling while ambulating within his house. (TARGET DATE: 07/26/2016)    Time 2   Period Months   Status On-going     PT LONG TERM GOAL #4   Title Patient will be able to ambulate over ramp/curb with rollator style walker with family assistance to indicate a decrease in his risk of falling when returning to community ambulation with family. (TARGET DATE: 07/26/2016)    Time 2   Period Months   Status On-going     PT LONG TERM GOAL #5   Title Patient will demonstrate ability to reach 8 inches anteriorly and reach within 8 inches from the floor unilateral UE support on RW with supervision to indicate a  decrease in his risk of falling. (TARGET DATE: 06/28/2016)    Time 2   Period Months   Status On-going               Plan - 07/09/16 2778    Clinical Impression Statement Patient had appropriate cardiac response to activities today when PT monitored HR manually. Patient did not meet any STGs due to missing 4 weeks of care awaiting cardiac work-up. PT reset STG target date to 07/26/2016, which is Medicare recertification period. Pt will need recertification at that time due to limited PT skilled care. Pt did well for first time on stairs & curb but intensity fatigued him.    Rehab Potential Good   Clinical Impairments Affecting Rehab Potential bilateral transtibial amputations, anemia, asthma, contracture of L knee, decubitus ulcer of sacral region, DM with PVD, ESRD, glaucoma, heart murmur, HTN, hypothyroidism, multiple myeloma, PAD   PT Frequency 2x / week   PT Duration Other (comment)  9 weeks    PT Treatment/Interventions ADLs/Self Care Home Management;Neuromuscular re-education;Balance training;Therapeutic exercise;Therapeutic activities;Functional mobility training;Stair training;Gait training;DME Instruction;Patient/family education;Prosthetic Training;Manual techniques;Scar mobilization;Passive range of motion   PT Next Visit Plan prosthetic gait with rollator walker, stairs with 2 rails, ramp & curb with rollator. Standing balance reaching including towards floor.    Consulted and Agree with Plan of  Care Patient;Family member/caregiver  daughter [name]       Patient will benefit from skilled therapeutic intervention in order to improve the following deficits and impairments:  Abnormal gait, Decreased activity tolerance, Decreased balance, Decreased coordination, Decreased range of motion, Decreased safety awareness, Decreased knowledge of use of DME, Decreased knowledge of precautions, Decreased endurance, Decreased strength, Difficulty walking, Prosthetic Dependency, Postural  dysfunction  Visit Diagnosis: Muscle weakness (generalized)  Unsteadiness on feet  Other abnormalities of gait and mobility  Contracture of muscle, multiple sites     Problem List Patient Active Problem List   Diagnosis Date Noted  . Dyslipidemia 11/21/2015  . Diabetic retinopathy ([place])- left eye 10/02/2015  . Complications, amputation stump late ([place]) 05/20/2014  . Weakness generalized 04/07/2014  . Sepsis ([place]) 04/07/2014  . Decubitus ulcer, stage II 04/07/2014  . Decubitus ulcer of ankle   . Fatigue   . Wound infection 04/06/2014  . Decubitus ulcer of sacral region, stage 2 03/07/2014  . Glaucoma 03/07/2014  . Gout 03/07/2014  . Below knee amputation status ([place]) 02/18/2014  . Osteomyelitis ([place]) 01/04/2014  . Phantom limb ([place]) 12/14/2013  . Type 2 diabetes mellitus with diabetic foot infection ([place])   . Diabetes mellitus with peripheral vascular disease ([place])   . Asthma 05/17/2013  . History of MRSA infection 04/22/2013  . Peripheral vascular disease ([place]) 11/19/2012  . End-stage renal disease on hemodialysis ([place]) 05/05/2012  . Kahler disease ([place]) 03/30/2012  . Anemia in chronic kidney disease 03/19/2012  . Hypothyroidism following radioiodine therapy 03/19/2012  . Anemia, iron deficiency 03/19/2012  . Hypertension 03/18/2012  . Chronic kidney disease (CKD), stage V ([place]) 03/18/2012  . Cardiac conduction disorder 03/18/2012  . MGUS (monoclonal gammopathy of unknown significance) 02/28/2011  . Monoclonal paraproteinemia 02/28/2011    Jamey Reas PT, DPT 07/09/2016, 9:37 AM  [facility] [address] [place], [place], [address] Phone: [phone]   Fax:  [phone]  Name: Shlomo Seres MRN: [id number] Date of Birth: 10/14/1945

## 2016-07-10 ENCOUNTER — Other Ambulatory Visit (INDEPENDENT_AMBULATORY_CARE_PROVIDER_SITE_OTHER): Payer: Self-pay

## 2016-07-10 DIAGNOSIS — N2581 Secondary hyperparathyroidism of renal origin: Secondary | ICD-10-CM | POA: Diagnosis not present

## 2016-07-10 DIAGNOSIS — N186 End stage renal disease: Secondary | ICD-10-CM | POA: Diagnosis not present

## 2016-07-10 DIAGNOSIS — E1029 Type 1 diabetes mellitus with other diabetic kidney complication: Secondary | ICD-10-CM | POA: Diagnosis not present

## 2016-07-10 DIAGNOSIS — T82598A Other mechanical complication of other cardiac and vascular devices and implants, initial encounter: Secondary | ICD-10-CM | POA: Diagnosis not present

## 2016-07-10 DIAGNOSIS — D689 Coagulation defect, unspecified: Secondary | ICD-10-CM | POA: Diagnosis not present

## 2016-07-10 DIAGNOSIS — D631 Anemia in chronic kidney disease: Secondary | ICD-10-CM | POA: Diagnosis not present

## 2016-07-11 ENCOUNTER — Ambulatory Visit (HOSPITAL_COMMUNITY)
Admission: RE | Admit: 2016-07-11 | Discharge: 2016-07-11 | Disposition: A | Discharge status: Home / Self Care | Payer: Medicare Other | Source: Ambulatory Visit | Attending: Vascular Surgery | Admitting: Vascular Surgery

## 2016-07-11 ENCOUNTER — Ambulatory Visit (INDEPENDENT_AMBULATORY_CARE_PROVIDER_SITE_OTHER)
Admission: RE | Admit: 2016-07-11 | Discharge: 2016-07-11 | Disposition: A | Discharge status: Home / Self Care | Payer: Medicare Other | Source: Ambulatory Visit | Attending: Vascular Surgery | Admitting: Vascular Surgery

## 2016-07-11 ENCOUNTER — Ambulatory Visit (INDEPENDENT_AMBULATORY_CARE_PROVIDER_SITE_OTHER): Payer: Medicare Other | Admitting: Orthopedic Surgery

## 2016-07-11 DIAGNOSIS — Z01810 Encounter for preprocedural cardiovascular examination: Secondary | ICD-10-CM | POA: Diagnosis not present

## 2016-07-11 DIAGNOSIS — T82868S Thrombosis of vascular prosthetic devices, implants and grafts, sequela: Secondary | ICD-10-CM

## 2016-07-12 DIAGNOSIS — D689 Coagulation defect, unspecified: Secondary | ICD-10-CM | POA: Diagnosis not present

## 2016-07-12 DIAGNOSIS — T82598A Other mechanical complication of other cardiac and vascular devices and implants, initial encounter: Secondary | ICD-10-CM | POA: Diagnosis not present

## 2016-07-12 DIAGNOSIS — E1029 Type 1 diabetes mellitus with other diabetic kidney complication: Secondary | ICD-10-CM | POA: Diagnosis not present

## 2016-07-12 DIAGNOSIS — D631 Anemia in chronic kidney disease: Secondary | ICD-10-CM | POA: Diagnosis not present

## 2016-07-12 DIAGNOSIS — N2581 Secondary hyperparathyroidism of renal origin: Secondary | ICD-10-CM | POA: Diagnosis not present

## 2016-07-12 DIAGNOSIS — N186 End stage renal disease: Secondary | ICD-10-CM | POA: Diagnosis not present

## 2016-07-13 DIAGNOSIS — N186 End stage renal disease: Secondary | ICD-10-CM | POA: Diagnosis not present

## 2016-07-13 DIAGNOSIS — C9000 Multiple myeloma not having achieved remission: Secondary | ICD-10-CM | POA: Diagnosis not present

## 2016-07-13 DIAGNOSIS — Z992 Dependence on renal dialysis: Secondary | ICD-10-CM | POA: Diagnosis not present

## 2016-07-15 DIAGNOSIS — T82598A Other mechanical complication of other cardiac and vascular devices and implants, initial encounter: Secondary | ICD-10-CM | POA: Diagnosis not present

## 2016-07-15 DIAGNOSIS — N186 End stage renal disease: Secondary | ICD-10-CM | POA: Diagnosis not present

## 2016-07-15 DIAGNOSIS — D689 Coagulation defect, unspecified: Secondary | ICD-10-CM | POA: Diagnosis not present

## 2016-07-15 DIAGNOSIS — E1029 Type 1 diabetes mellitus with other diabetic kidney complication: Secondary | ICD-10-CM | POA: Diagnosis not present

## 2016-07-15 DIAGNOSIS — D631 Anemia in chronic kidney disease: Secondary | ICD-10-CM | POA: Diagnosis not present

## 2016-07-15 DIAGNOSIS — N2581 Secondary hyperparathyroidism of renal origin: Secondary | ICD-10-CM | POA: Diagnosis not present

## 2016-07-16 ENCOUNTER — Encounter: Payer: Self-pay | Admitting: Physical Therapy

## 2016-07-16 ENCOUNTER — Ambulatory Visit: Payer: Medicare Other | Attending: Orthopedic Surgery | Admitting: Physical Therapy

## 2016-07-16 DIAGNOSIS — R2681 Unsteadiness on feet: Secondary | ICD-10-CM

## 2016-07-16 DIAGNOSIS — M6281 Muscle weakness (generalized): Secondary | ICD-10-CM | POA: Diagnosis not present

## 2016-07-16 DIAGNOSIS — R2689 Other abnormalities of gait and mobility: Secondary | ICD-10-CM | POA: Diagnosis not present

## 2016-07-16 DIAGNOSIS — M6249 Contracture of muscle, multiple sites: Secondary | ICD-10-CM | POA: Insufficient documentation

## 2016-07-16 NOTE — Therapy (Signed)
[facility] [address] [place], [place], [address] Phone: [phone]   Fax:  [phone]  Physical Therapy Treatment  Patient Details  Name: Jack [name] MRN: [id number] Date of Birth: 09/06/1945 Referring Provider: Meridee Score MD   Encounter Date: 07/16/2016      PT End of Session - 07/16/16 0939    Visit Number 6   Number of Visits 18   Date for PT Re-Evaluation 07/26/16   Authorization Type Medicare & G-codes every 10th visit    PT Start Time 0845   PT Stop Time 0927   PT Time Calculation (min) 42 min   Equipment Utilized During Treatment Gait belt   Activity Tolerance Patient tolerated treatment well   Behavior During Therapy [facility] for tasks assessed/performed      Past Medical History:  Diagnosis Date  . Anemia   . Asthma   . Contracture of joint    left knee  . Decubitus ulcer of sacral region, stage 2 03/07/2014  . Diabetes mellitus with peripheral vascular disease ([place])   . End-stage renal disease on hemodialysis [facility])    Started HD March 2014.  Cause of ESRD was DM.  Gets HD at [facility] on [address] on MWF schedule.   Marland Kitchen ESRD on hemodialysis ([place])   . Gangrene ([place])    right BKA  . Gangrene of foot ([place])   . Glaucoma   . Glaucoma 03/07/2014  . Heart murmur   . History of MRSA infection 04/22/2013   Bacteremia assoc w L foot wound infection Mar 2015   . Hyperparathyroidism, secondary renal ([place])   . Hypertension   . Hypothyroidism   . MRSA bacteremia   . Multiple myeloma, without mention of having achieved remission 03/30/2012   Cytogenetic neg on 03/23/2012.  Marland Kitchen Peripheral arterial disease ([place])   . Peripheral vascular disease, unspecified ([place]) 11/19/2012   In the past had R foot toe amps then R TMA. In 2015 had left foot toe amp > then TMA >then L BKA on 05/14/13   . Pneumonia    2012  . Thyroid disease    hyperparathyroidism    Past Surgical History:  Procedure Laterality Date  .  ABDOMINAL AORTAGRAM Bilateral 11/06/2012   Procedure: ABDOMINAL AORTAGRAM;  Surgeon: Elam Dutch, MD;  Location: [facility] CATH LAB;  Service: Cardiovascular;  Laterality: Bilateral;  . AMPUTATION Right 11/10/2012   Procedure: AMPUTATION FIRST and SECOND TOES Right Foot;  Surgeon: Elam Dutch, MD;  Location: [place];  Service: Vascular;  Laterality: Right;  . AMPUTATION Left 04/07/2013   Procedure: AMPUTATION DIGIT- LEFT 1ST TOE;  Surgeon: Mal Misty, MD;  Location: [place];  Service: Vascular;  Laterality: Left;  . AMPUTATION Left 04/26/2013   Procedure: Left Foot Transmetatarsal Amputation;  Surgeon: Newt Minion, MD;  Location: [place];  Service: Orthopedics;  Laterality: Left;  . AMPUTATION Left 05/14/2013   Procedure: AMPUTATION BELOW KNEE;  Surgeon: Newt Minion, MD;  Location: [place];  Service: Orthopedics;  Laterality: Left;  Left Below Knee Amputation  . AMPUTATION Left 07/23/2013   Procedure: AMPUTATION BELOW KNEE;  Surgeon: Newt Minion, MD;  Location: [place];  Service: Orthopedics;  Laterality: Left;  Left Below Knee Amputation Revision  . AMPUTATION Right 02/18/2014   Procedure: AMPUTATION BELOW KNEE;  Surgeon: Newt Minion, MD;  Location: [place];  Service: Orthopedics;  Laterality: Right;  . AV FISTULA PLACEMENT Left 03/25/2012   Procedure: ARTERIOVENOUS (AV)  FISTULA CREATION;  Surgeon: Mal Misty, MD;  Location: [place];  Service: Vascular;  Laterality: Left;  . BELOW KNEE LEG AMPUTATION Left 05/14/2013   DR [name]  . BELOW KNEE LEG AMPUTATION Right 02/18/2014   dr duda  . CARDIAC CATHETERIZATION     approx 30 years ago  . CERVICAL DISC SURGERY    . EYE SURGERY Bilateral    CATARACTS  . I&D EXTREMITY Left 04/22/2013   Procedure: IRRIGATION AND DEBRIDEMENT LEFT FIRST TOE AMPUTATION WOUND ;  Surgeon: Mal Misty, MD;  Location: [place];  Service: Vascular;  Laterality: Left;  . I&D EXTREMITY Right 01/05/2014   Procedure: IRRIGATION AND DEBRIDEMENT Right Heel Ulcer;  Surgeon:  Mcarthur Rossetti, MD;  Location: [place];  Service: Orthopedics;  Laterality: Right;  Surgeon Available after 5PM  . INSERTION OF DIALYSIS CATHETER Right 03/19/2012   Procedure: INSERTION OF DIALYSIS CATHETER;  Surgeon: Mal Misty, MD;  Location: [place];  Service: Vascular;  Laterality: Right;  Right Internal Jugular  . LIGATION OF COMPETING BRANCHES OF ARTERIOVENOUS FISTULA Left 05/08/2012   Procedure: LIGATION OF COMPETING BRANCHES OF ARTERIOVENOUS FISTULA;  Surgeon: Mal Misty, MD;  Location: [place];  Service: Vascular;  Laterality: Left;  Ultrasound guided  . REPAIR QUADRICEPS/HAMSTRING MUSCLES Left 05/20/2014   Procedure: Left Hamstring Release;  Surgeon: Newt Minion, MD;  Location: [place];  Service: Orthopedics;  Laterality: Left;  . REVISON OF ARTERIOVENOUS FISTULA Left 01/30/2016   Procedure: CEPHALIC VEIN TURNDOWN TO LEFT UPPER ARM;  Surgeon: Elam Dutch, MD;  Location: [place];  Service: Vascular;  Laterality: Left;  . STUMP REVISION Right 05/20/2014   Procedure: Revision Right Below Knee Amputation;  Surgeon: Newt Minion, MD;  Location: [place];  Service: Orthopedics;  Laterality: Right;  . TEE WITHOUT CARDIOVERSION N/A 04/20/2013   Procedure: TRANSESOPHAGEAL ECHOCARDIOGRAM (TEE);  Surgeon: Josue Hector, MD;  Location: [facility] ENDOSCOPY;  Service: Cardiovascular;  Laterality: N/A;  . THYROIDECTOMY    . TOE AMPUTATION     D/C 04-30-13  . TRANSMETATARSAL AMPUTATION Left 12/16/2012   Procedure: TRANSMETATARSAL AMPUTATION AND VAC PLACEMENT;  Surgeon: Elam Dutch, MD;  Location: [place];  Service: Vascular;  Laterality: Left;    There were no vitals filed for this visit.      Subjective Assessment - 07/16/16 0846    Subjective Denies any falls or complaints. Patient reports he has been walking at home some with his daughter.    Patient is accompained by: Family member   Pertinent History bilateral transtibial amputations, anemia, asthma, contracture of L knee, decubitus ulcer of  sacral region, DM with PVD, ESRD, glaucoma, heart murmur, HTN, hypothyroidism, multiple myeloma, PAD   Limitations Lifting;Standing;Walking;House hold activities   Patient Stated Goals walk without a person assisting him (walk with device only)    Currently in Pain? No/denies   Pain Onset In the past 7 days                         [facility] Adult PT Treatment/Exercise - 07/16/16 0845      Transfers   Transfers Sit to Stand;Stand to Sit   Sit to Stand 4: Min guard;With upper extremity assist;From chair/3-in-1  to rollator   Sit to Stand Details Verbal cues for precautions/safety;Verbal cues for safe use of DME/AE;Verbal cues for technique   Stand to Sit 4: Min guard;With upper extremity assist;To chair/3-in-1;With armrests  from rollator   Stand  to Sit Details (indicate cue type and reason) Verbal cues for technique;Verbal cues for precautions/safety;Verbal cues for safe use of DME/AE   Stand Pivot Transfers 4: Min assist  pivot turning from wheelchair to rollator    Stand Pivot Transfer Details (indicate cue type and reason) Rollator was stabilized against a wall. Patient required cueing for hand placement and foot placement throughout 180 degree pivot turn.      Ambulation/Gait   Ambulation/Gait Yes   Ambulation/Gait Assistance 4: Min guard   Ambulation/Gait Assistance Details Requires cueing to not allow rollator to get ahead of him. Requires intermittent stabilization on the front of the rollator to prevent patient from keeping rollator too far in front of him.   Ambulation Distance (Feet) 75 Feet   Assistive device Rollator;Prostheses   Gait Pattern Step-through pattern;Decreased step length - right;Decreased step length - left;Decreased stride length;Right flexed knee in stance;Left flexed knee in stance;Trunk flexed;Wide base of support;Poor foot clearance - left;Poor foot clearance - right   Ambulation Surface Level;Indoor   Stairs Yes   Stairs Assistance 2: Max  assist  2 people for safety   Stairs Assistance Details (indicate cue type and reason) Required cueing to "push with arms" and for foot placement on steps    Stair Management Technique Two rails;Step to pattern;Forwards   Number of Stairs 4  seated rest on stool at top of stairs    Ramp 2: Max assist  2nd person for safety    Ramp Details (indicate cue type and reason) One person in front to stabilize rollator and second person behind him for safety. Require scueing for foot placement and technique.   Curb 2: Max assist  2 people for safety   Curb Details (indicate cue type and reason) Requires cueing for sequencing with prostheses and rollator and cueing for forward weight shift (patient pushes backwards when trying to advance rollator). One person in front to stabilize rollator with second person behind.      Prosthetics   Prosthetic Care Comments  Patient has appointment with Dr. Sharol Given on 08/05/2016.    Current prosthetic wear tolerance (days/week)  daily    Current prosthetic wear tolerance (#hours/day)  all awake hours every day   Residual limb condition  Denies any skin integrity issues.      Vital Signs: HR response throughout session:   After 20f ambulation (taken manually): HR = 74bpm  After up ramp/down curb (taken manually): HR = 66bpm  After up curb/down ramp (taken manually): HR = 66bpm  After up 4 steps (taken manually): HR = 76bpm  After down 4 steps (taken manually): HR = 64bpm            PT Education - 07/16/16 0938    Education provided Yes   Education Details practice 180 degree pivot turning from wheelchair to rollator that is stabilized against a wall    Person(s) Educated Patient;Child(ren)   Methods Explanation;Demonstration;Tactile cues;Verbal cues   Comprehension Verbalized understanding;Returned demonstration          PT Short Term Goals - 07/09/16 0919      PT SHORT TERM GOAL #1   Title Patient will verbalize understanding and return  demonstration of initial HEP. (TARGET DATE: 06/28/2016)    Baseline Partially MET Pt verbalized & demonstrated basic understanding of initial HEP but needs further instruction   Time 1   Period Months   Status Partially Met     PT SHORT TERM GOAL #2   Title Patient will demonstrate  ability to reach 5 inches anteriorly and reach within 10 inches from the floor unilateral UE support on RW with supervision to indicate a decrease in his risk of falling. (TARGET DATE: 06/28/2016)  NEW Target 07/26/2016     Baseline NOT MET 07/09/2016 Patient missed 4 weeks of PT care for cardiac work-up so limited progress.    Time 1   Period Months   Status On-going     PT SHORT TERM GOAL #3   Title PT will perform TUG, and short term goal will be set. (TARGET DATE: 06/28/2016) NEW Target Date 07/26/2016   Baseline NOT MET 07/09/2016 Patient missed 4 weeks of PT care for cardiac work-up so limited progress.    Time 1   Period Months   Status On-going     PT SHORT TERM GOAL #4   Title Patient will demonstrate ability to ambulate 150 feet on indoor surfaces with rollator style walker and supervision to indicate a decrease in his risk of falling. (TARGET DATE: 06/28/2016)  New Target Date 07/26/2016   Baseline NOT MET 07/09/2016 Patient missed 4 weeks of PT care for cardiac work-up so limited progress.    Time 1   Period Months   Status On-going           PT Long Term Goals - 07/09/16 0926      PT LONG TERM GOAL #1   Title Patient will verbalize understanding and return demonstration of ongoing HEP program. (TARGET DATE: 07/26/2016)    Time 2   Period Months   Status On-going     PT LONG TERM GOAL #2   Title Patient will ambulate 500 feet on firm, paved outdoor surfaces with family support to indicate a decrease risk of falling when returning to community ambulation with family support. (TARGET DATE: 07/26/2016)    Time 2   Period Months   Status On-going     PT LONG TERM GOAL #3   Title Patient will be  able to ambulate 100 feet with rollator style walker with mod I to indicate a decrease in his risk of falling while ambulating within his house. (TARGET DATE: 07/26/2016)    Time 2   Period Months   Status On-going     PT LONG TERM GOAL #4   Title Patient will be able to ambulate over ramp/curb with rollator style walker with family assistance to indicate a decrease in his risk of falling when returning to community ambulation with family. (TARGET DATE: 07/26/2016)    Time 2   Period Months   Status On-going     PT LONG TERM GOAL #5   Title Patient will demonstrate ability to reach 8 inches anteriorly and reach within 8 inches from the floor unilateral UE support on RW with supervision to indicate a decrease in his risk of falling. (TARGET DATE: 06/28/2016)    Time 2   Period Months   Status On-going               Plan - 07/16/16 0942    Clinical Impression Statement Today's skilled PT session focused on advancing patient's prosthetic gait training including ramp, curb, and stairs, which were all performed with a second person present for safety. PT continued to work on pivot turning to sit on rollator seat with the rollator stabilized. PT monitored patient's HR response throughout today's session, and patient's HR remained between 64bpm - 76bpm. Patient denied any increase in pain, but was very fatigued and SOB with exertion requiring  multiple intermittent seated rest breaks. Patient is making progress towards goals, and will benefit from continued skilled PT to address functional mobility deficits.    Rehab Potential Good   Clinical Impairments Affecting Rehab Potential bilateral transtibial amputations, anemia, asthma, contracture of L knee, decubitus ulcer of sacral region, DM with PVD, ESRD, glaucoma, heart murmur, HTN, hypothyroidism, multiple myeloma, PAD   PT Frequency 2x / week   PT Duration Other (comment)  9 weeks    PT Treatment/Interventions ADLs/Self Care Home  Management;Neuromuscular re-education;Balance training;Therapeutic exercise;Therapeutic activities;Functional mobility training;Stair training;Gait training;DME Instruction;Patient/family education;Prosthetic Training;Manual techniques;Scar mobilization;Passive range of motion   PT Next Visit Plan progress standing balance with UE support on rollator + reaching; progress prosthetic gait with rollator + barriers; pivot turning from wheelchair to rollator seat - progressing to completing transfer onto rollator without rollator being stabilized against wall (as appropriate)    Consulted and Agree with Plan of Care Patient;Family member/caregiver  daughter [name]       Patient will benefit from skilled therapeutic intervention in order to improve the following deficits and impairments:  Abnormal gait, Decreased activity tolerance, Decreased balance, Decreased coordination, Decreased range of motion, Decreased safety awareness, Decreased knowledge of use of DME, Decreased knowledge of precautions, Decreased endurance, Decreased strength, Difficulty walking, Prosthetic Dependency, Postural dysfunction  Visit Diagnosis: Muscle weakness (generalized)  Unsteadiness on feet  Other abnormalities of gait and mobility     Problem List Patient Active Problem List   Diagnosis Date Noted  . Dyslipidemia 11/21/2015  . Diabetic retinopathy ([place])- left eye 10/02/2015  . Complications, amputation stump late ([place]) 05/20/2014  . Weakness generalized 04/07/2014  . Sepsis ([place]) 04/07/2014  . Decubitus ulcer, stage II 04/07/2014  . Decubitus ulcer of ankle   . Fatigue   . Wound infection 04/06/2014  . Decubitus ulcer of sacral region, stage 2 03/07/2014  . Glaucoma 03/07/2014  . Gout 03/07/2014  . Below knee amputation status ([place]) 02/18/2014  . Osteomyelitis ([place]) 01/04/2014  . Phantom limb ([place]) 12/14/2013  . Type 2 diabetes mellitus with diabetic foot infection ([place])   . Diabetes mellitus with  peripheral vascular disease ([place])   . Asthma 05/17/2013  . History of MRSA infection 04/22/2013  . Peripheral vascular disease ([place]) 11/19/2012  . End-stage renal disease on hemodialysis ([place]) 05/05/2012  . Kahler disease ([place]) 03/30/2012  . Anemia in chronic kidney disease 03/19/2012  . Hypothyroidism following radioiodine therapy 03/19/2012  . Anemia, iron deficiency 03/19/2012  . Hypertension 03/18/2012  . Chronic kidney disease (CKD), stage V ([place]) 03/18/2012  . Cardiac conduction disorder 03/18/2012  . MGUS (monoclonal gammopathy of unknown significance) 02/28/2011  . Monoclonal paraproteinemia 02/28/2011    Arelia Sneddon, SPT  07/16/2016, 9:45 AM  [facility] [address] [place], [place], [address] Phone: [phone]   Fax:  [phone]  Name: Earlie Schank MRN: [id number] Date of Birth: Jul 03, 1945

## 2016-07-17 DIAGNOSIS — E1029 Type 1 diabetes mellitus with other diabetic kidney complication: Secondary | ICD-10-CM | POA: Diagnosis not present

## 2016-07-17 DIAGNOSIS — T82598A Other mechanical complication of other cardiac and vascular devices and implants, initial encounter: Secondary | ICD-10-CM | POA: Diagnosis not present

## 2016-07-17 DIAGNOSIS — N2581 Secondary hyperparathyroidism of renal origin: Secondary | ICD-10-CM | POA: Diagnosis not present

## 2016-07-17 DIAGNOSIS — D631 Anemia in chronic kidney disease: Secondary | ICD-10-CM | POA: Diagnosis not present

## 2016-07-17 DIAGNOSIS — D689 Coagulation defect, unspecified: Secondary | ICD-10-CM | POA: Diagnosis not present

## 2016-07-17 DIAGNOSIS — N186 End stage renal disease: Secondary | ICD-10-CM | POA: Diagnosis not present

## 2016-07-18 ENCOUNTER — Encounter: Payer: Self-pay | Admitting: Vascular Surgery

## 2016-07-18 ENCOUNTER — Ambulatory Visit: Payer: Medicare Other | Admitting: Physical Therapy

## 2016-07-18 ENCOUNTER — Ambulatory Visit (INDEPENDENT_AMBULATORY_CARE_PROVIDER_SITE_OTHER): Payer: Medicare Other | Admitting: Vascular Surgery

## 2016-07-18 ENCOUNTER — Encounter: Payer: Self-pay | Admitting: Physical Therapy

## 2016-07-18 VITALS — BP 148/82 | HR 85 | Temp 98.5°F | Resp 20 | Ht 70.0 in | Wt 163.0 lb

## 2016-07-18 DIAGNOSIS — R2681 Unsteadiness on feet: Secondary | ICD-10-CM | POA: Diagnosis not present

## 2016-07-18 DIAGNOSIS — M6249 Contracture of muscle, multiple sites: Secondary | ICD-10-CM | POA: Diagnosis not present

## 2016-07-18 DIAGNOSIS — N186 End stage renal disease: Secondary | ICD-10-CM

## 2016-07-18 DIAGNOSIS — R2689 Other abnormalities of gait and mobility: Secondary | ICD-10-CM | POA: Diagnosis not present

## 2016-07-18 DIAGNOSIS — M6281 Muscle weakness (generalized): Secondary | ICD-10-CM | POA: Diagnosis not present

## 2016-07-18 DIAGNOSIS — Z992 Dependence on renal dialysis: Secondary | ICD-10-CM

## 2016-07-18 NOTE — Therapy (Signed)
[facility] [address] [place], [place], [address] Phone: [phone]   Fax:  [phone]  Physical Therapy Treatment  Patient Details  Name: Jack [name] MRN: [id number] Date of Birth: 09-09-1945 Referring Provider: Meridee Score MD   Encounter Date: 07/18/2016      PT End of Session - 07/18/16 1159    Visit Number 7   Number of Visits 18   Date for PT Re-Evaluation 07/26/16   Authorization Type Medicare & G-codes every 10th visit    PT Start Time 0845   PT Stop Time 0928   PT Time Calculation (min) 43 min   Equipment Utilized During Treatment Gait belt   Activity Tolerance Patient tolerated treatment well   Behavior During Therapy [facility] for tasks assessed/performed      Past Medical History:  Diagnosis Date  . Anemia   . Asthma   . Contracture of joint    left knee  . Decubitus ulcer of sacral region, stage 2 03/07/2014  . Diabetes mellitus with peripheral vascular disease ([place])   . End-stage renal disease on hemodialysis [facility])    Started HD March 2014.  Cause of ESRD was DM.  Gets HD at [facility] on [address] on MWF schedule.   Marland Kitchen ESRD on hemodialysis ([place])   . Gangrene ([place])    right BKA  . Gangrene of foot ([place])   . Glaucoma   . Glaucoma 03/07/2014  . Heart murmur   . History of MRSA infection 04/22/2013   Bacteremia assoc w L foot wound infection Mar 2015   . Hyperparathyroidism, secondary renal ([place])   . Hypertension   . Hypothyroidism   . MRSA bacteremia   . Multiple myeloma, without mention of having achieved remission 03/30/2012   Cytogenetic neg on 03/23/2012.  Marland Kitchen Peripheral arterial disease ([place])   . Peripheral vascular disease, unspecified ([place]) 11/19/2012   In the past had R foot toe amps then R TMA. In 2015 had left foot toe amp > then TMA >then L BKA on 05/14/13   . Pneumonia    2012  . Thyroid disease    hyperparathyroidism    Past Surgical History:  Procedure Laterality Date  .  ABDOMINAL AORTAGRAM Bilateral 11/06/2012   Procedure: ABDOMINAL AORTAGRAM;  Surgeon: Elam Dutch, MD;  Location: [facility] CATH LAB;  Service: Cardiovascular;  Laterality: Bilateral;  . AMPUTATION Right 11/10/2012   Procedure: AMPUTATION FIRST and SECOND TOES Right Foot;  Surgeon: Elam Dutch, MD;  Location: [place];  Service: Vascular;  Laterality: Right;  . AMPUTATION Left 04/07/2013   Procedure: AMPUTATION DIGIT- LEFT 1ST TOE;  Surgeon: Mal Misty, MD;  Location: [place];  Service: Vascular;  Laterality: Left;  . AMPUTATION Left 04/26/2013   Procedure: Left Foot Transmetatarsal Amputation;  Surgeon: Newt Minion, MD;  Location: [place];  Service: Orthopedics;  Laterality: Left;  . AMPUTATION Left 05/14/2013   Procedure: AMPUTATION BELOW KNEE;  Surgeon: Newt Minion, MD;  Location: [place];  Service: Orthopedics;  Laterality: Left;  Left Below Knee Amputation  . AMPUTATION Left 07/23/2013   Procedure: AMPUTATION BELOW KNEE;  Surgeon: Newt Minion, MD;  Location: [place];  Service: Orthopedics;  Laterality: Left;  Left Below Knee Amputation Revision  . AMPUTATION Right 02/18/2014   Procedure: AMPUTATION BELOW KNEE;  Surgeon: Newt Minion, MD;  Location: [facility];  Service: Orthopedics;  Laterality: Right;  . AV FISTULA PLACEMENT Left 03/25/2012   Procedure: ARTERIOVENOUS (AV)  FISTULA CREATION;  Surgeon: Mal Misty, MD;  Location: [facility];  Service: Vascular;  Laterality: Left;  . BELOW KNEE LEG AMPUTATION Left 05/14/2013   DR [name]  . BELOW KNEE LEG AMPUTATION Right 02/18/2014   dr duda  . CARDIAC CATHETERIZATION     approx 30 years ago  . CERVICAL DISC SURGERY    . EYE SURGERY Bilateral    CATARACTS  . I&D EXTREMITY Left 04/22/2013   Procedure: IRRIGATION AND DEBRIDEMENT LEFT FIRST TOE AMPUTATION WOUND ;  Surgeon: Mal Misty, MD;  Location: [facility];  Service: Vascular;  Laterality: Left;  . I&D EXTREMITY Right 01/05/2014   Procedure: IRRIGATION AND DEBRIDEMENT Right Heel Ulcer;  Surgeon:  Mcarthur Rossetti, MD;  Location: [facility];  Service: Orthopedics;  Laterality: Right;  Surgeon Available after 5PM  . INSERTION OF DIALYSIS CATHETER Right 03/19/2012   Procedure: INSERTION OF DIALYSIS CATHETER;  Surgeon: Mal Misty, MD;  Location: [facility];  Service: Vascular;  Laterality: Right;  Right Internal Jugular  . LIGATION OF COMPETING BRANCHES OF ARTERIOVENOUS FISTULA Left 05/08/2012   Procedure: LIGATION OF COMPETING BRANCHES OF ARTERIOVENOUS FISTULA;  Surgeon: Mal Misty, MD;  Location: [facility];  Service: Vascular;  Laterality: Left;  Ultrasound guided  . REPAIR QUADRICEPS/HAMSTRING MUSCLES Left 05/20/2014   Procedure: Left Hamstring Release;  Surgeon: Newt Minion, MD;  Location: [place];  Service: Orthopedics;  Laterality: Left;  . REVISON OF ARTERIOVENOUS FISTULA Left 01/30/2016   Procedure: CEPHALIC VEIN TURNDOWN TO LEFT UPPER ARM;  Surgeon: Elam Dutch, MD;  Location: [facility];  Service: Vascular;  Laterality: Left;  . STUMP REVISION Right 05/20/2014   Procedure: Revision Right Below Knee Amputation;  Surgeon: Newt Minion, MD;  Location: [facility];  Service: Orthopedics;  Laterality: Right;  . TEE WITHOUT CARDIOVERSION N/A 04/20/2013   Procedure: TRANSESOPHAGEAL ECHOCARDIOGRAM (TEE);  Surgeon: Josue Hector, MD;  Location: [facility] ENDOSCOPY;  Service: Cardiovascular;  Laterality: N/A;  . THYROIDECTOMY    . TOE AMPUTATION     D/C 04-30-13  . TRANSMETATARSAL AMPUTATION Left 12/16/2012   Procedure: TRANSMETATARSAL AMPUTATION AND VAC PLACEMENT;  Surgeon: Elam Dutch, MD;  Location: [place];  Service: Vascular;  Laterality: Left;    There were no vitals filed for this visit.      Subjective Assessment - 07/18/16 0847    Subjective Patiend denies any falls or changes since last visit. Patient and patient's daughter report they have been walking some at home.    Patient is accompained by: Family member   Pertinent History bilateral transtibial amputations, anemia, asthma, contracture of  L knee, decubitus ulcer of sacral region, DM with PVD, ESRD, glaucoma, heart murmur, HTN, hypothyroidism, multiple myeloma, PAD   Limitations Lifting;Standing;Walking;House hold activities   Patient Stated Goals walk without a person assisting him (walk with device only)    Currently in Pain? No/denies   Pain Onset In the past 7 days                         [facility] Adult PT Treatment/Exercise - 07/18/16 0845      Transfers   Transfers Sit to Stand;Stand to Sit   Sit to Stand 4: Min guard;With upper extremity assist;From chair/3-in-1  to rollator   Sit to Stand Details Verbal cues for precautions/safety;Verbal cues for safe use of DME/AE;Verbal cues for technique   Stand to Sit 4: Min guard;With upper extremity assist;To chair/3-in-1;With armrests  from  rollator   Stand to Sit Details (indicate cue type and reason) Verbal cues for technique;Verbal cues for precautions/safety;Verbal cues for safe use of DME/AE   Stand Pivot Transfers 4: Min guard  pivot turning to sit <> stand on rollator seat   Stand Pivot Transfer Details (indicate cue type and reason) Requires cueing for hand placement and to pivot feet      Ambulation/Gait   Ambulation/Gait Yes   Ambulation/Gait Assistance 4: Min guard;4: Min assist   Ambulation/Gait Assistance Details Requires cueing and intermittent stabilization on rollator to prevent it from getting ahead of patient    Ambulation Distance (Feet) 55 Feet  109f with dress shoes; 435f  Assistive device Rollator;Prostheses   Gait Pattern Step-through pattern;Decreased step length - right;Decreased step length - left;Decreased stride length;Right flexed knee in stance;Left flexed knee in stance;Trunk flexed;Wide base of support;Poor foot clearance - left;Poor foot clearance - right   Ambulation Surface Level;Indoor   Stairs --   [employer]-  2 people for safety   Stair Management Technique --   Number of Stairs --   Ramp 3: Mod assist   2nd person for safety    Ramp Details (indicate cue type and reason) Requires assistance to stabilize rollator and cueing for sequence with B prostheses   Curb --   Gait Comments PT instructed patient in ambulating with men's dress shoes donned on B prostheses for 5531f    Prosthetics   Prosthetic Care Comments  --   Current prosthetic wear tolerance (days/week)  daily    Current prosthetic wear tolerance (#hours/day)  all awake hours every day   Residual limb condition  Denies any skin integrity issues.       Vital Signs throughout PT session:  At rest: HR (taken manually) = 60bpm  After [id number]: HR (taken manually) = 66bpm  After ambulating up the ramp: HR (taken manually) = 84bpm  After seated rest break - after transferring sit to stand requiring min A - mod A from PT. Patient reported feeling very fatigued/tired: HR (taken manually) =68bpm; BP = 156/88             PT Education - 07/18/16 0901    Education provided Yes   Education Details donning/doffing patient's men's dress shoes by utilizing shoe horn and thin socks (simulating men's dress socks); [facility]   Person(s) Educated Patient;Child(ren)   Methods Explanation;Demonstration;Tactile cues;Verbal cues   Comprehension Verbalized understanding;Returned demonstration          PT Short Term Goals - 07/09/16 0919      PT SHORT TERM GOAL #1   Title Patient will verbalize understanding and return demonstration of initial HEP. (TARGET DATE: 06/28/2016)    Baseline Partially MET Pt verbalized & demonstrated basic understanding of initial HEP but needs further instruction   Time 1   Period Months   Status Partially Met     PT SHORT TERM GOAL #2   Title Patient will demonstrate ability to reach 5 inches anteriorly and reach within 10 inches from the floor unilateral UE support on RW with supervision to indicate a decrease in his risk of falling. (TARGET DATE: 06/28/2016)   NEW Target 07/26/2016     Baseline NOT MET 07/09/2016 Patient missed 4 weeks of PT care for cardiac work-up so limited progress.    Time 1   Period Months   Status On-going     PT SHORT TERM GOAL #  3   Title PT will perform TUG, and short term goal will be set. (TARGET DATE: 06/28/2016) NEW Target Date 07/26/2016   Baseline NOT MET 07/09/2016 Patient missed 4 weeks of PT care for cardiac work-up so limited progress.    Time 1   Period Months   Status On-going     PT SHORT TERM GOAL #4   Title Patient will demonstrate ability to ambulate 150 feet on indoor surfaces with rollator style walker and supervision to indicate a decrease in his risk of falling. (TARGET DATE: 06/28/2016)  New Target Date 07/26/2016   Baseline NOT MET 07/09/2016 Patient missed 4 weeks of PT care for cardiac work-up so limited progress.    Time 1   Period Months   Status On-going           PT Long Term Goals - 07/09/16 0926      PT LONG TERM GOAL #1   Title Patient will verbalize understanding and return demonstration of ongoing HEP program. (TARGET DATE: 07/26/2016)    Time 2   Period Months   Status On-going     PT LONG TERM GOAL #2   Title Patient will ambulate 500 feet on firm, paved outdoor surfaces with family support to indicate a decrease risk of falling when returning to community ambulation with family support. (TARGET DATE: 07/26/2016)    Time 2   Period Months   Status On-going     PT LONG TERM GOAL #3   Title Patient will be able to ambulate 100 feet with rollator style walker with mod I to indicate a decrease in his risk of falling while ambulating within his house. (TARGET DATE: 07/26/2016)    Time 2   Period Months   Status On-going     PT LONG TERM GOAL #4   Title Patient will be able to ambulate over ramp/curb with rollator style walker with family assistance to indicate a decrease in his risk of falling when returning to community ambulation with family. (TARGET DATE: 07/26/2016)    Time 2    Period Months   Status On-going     PT LONG TERM GOAL #5   Title Patient will demonstrate ability to reach 8 inches anteriorly and reach within 8 inches from the floor unilateral UE support on RW with supervision to indicate a decrease in his risk of falling. (TARGET DATE: 06/28/2016)    Time 2   Period Months   Status On-going               Plan - 07/18/16 1204    Clinical Impression Statement Today's skilled PT session focused on advancing patient's prosthetic gait training including ramp, curb, and transitioning sit <> stand from rollator seat when rollator was stabilized against the wall. PT monitored patient's HR response manually throughout today's session, and his HR response was appropriate for the activities he performed. Patient was very fatigued at the end of today's session, and struggled to perform a sit to stand transfer. He denied any pain, but reported feeling tired. PT assessed patient's vitals (BP = 156/88 and HR = 68bpm). Patient is making progress towards goals, and will benefit from continued skilled PT to address functional mobility deficits.    Rehab Potential Good   Clinical Impairments Affecting Rehab Potential bilateral transtibial amputations, anemia, asthma, contracture of L knee, decubitus ulcer of sacral region, DM with PVD, ESRD, glaucoma, heart murmur, HTN, hypothyroidism, multiple myeloma, PAD   PT Frequency 2x / week  PT Duration Other (comment)  9 weeks    PT Treatment/Interventions ADLs/Self Care Home Management;Neuromuscular re-education;Balance training;Therapeutic exercise;Therapeutic activities;Functional mobility training;Stair training;Gait training;DME Instruction;Patient/family education;Prosthetic Training;Manual techniques;Scar mobilization;Passive range of motion   PT Next Visit Plan monitor patient's HR resposne throughout session; progress prosthetic gait training with rollator + barriers and pivot turning perform sit <> stand with rollator  seat - decrease stabilization of rollator against the wall as indicated; STGs due 07/26/2016    Consulted and Agree with Plan of Care Patient;Family member/caregiver  daughter [name]       Patient will benefit from skilled therapeutic intervention in order to improve the following deficits and impairments:  Abnormal gait, Decreased activity tolerance, Decreased balance, Decreased coordination, Decreased range of motion, Decreased safety awareness, Decreased knowledge of use of DME, Decreased knowledge of precautions, Decreased endurance, Decreased strength, Difficulty walking, Prosthetic Dependency, Postural dysfunction  Visit Diagnosis: Muscle weakness (generalized)  Unsteadiness on feet  Other abnormalities of gait and mobility     Problem List Patient Active Problem List   Diagnosis Date Noted  . Dyslipidemia 11/21/2015  . Diabetic retinopathy ([place])- left eye 10/02/2015  . Complications, amputation stump late ([place]) 05/20/2014  . Weakness generalized 04/07/2014  . Sepsis ([place]) 04/07/2014  . Decubitus ulcer, stage II 04/07/2014  . Decubitus ulcer of ankle   . Fatigue   . Wound infection 04/06/2014  . Decubitus ulcer of sacral region, stage 2 03/07/2014  . Glaucoma 03/07/2014  . Gout 03/07/2014  . Below knee amputation status ([place]) 02/18/2014  . Osteomyelitis ([place]) 01/04/2014  . Phantom limb ([place]) 12/14/2013  . Type 2 diabetes mellitus with diabetic foot infection ([place])   . Diabetes mellitus with peripheral vascular disease ([place])   . Asthma 05/17/2013  . History of MRSA infection 04/22/2013  . Peripheral vascular disease ([place]) 11/19/2012  . End-stage renal disease on hemodialysis ([place]) 05/05/2012  . Kahler disease ([place]) 03/30/2012  . Anemia in chronic kidney disease 03/19/2012  . Hypothyroidism following radioiodine therapy 03/19/2012  . Anemia, iron deficiency 03/19/2012  . Hypertension 03/18/2012  . Chronic kidney disease (CKD), stage V ([place]) 03/18/2012  . Cardiac  conduction disorder 03/18/2012  . MGUS (monoclonal gammopathy of unknown significance) 02/28/2011  . Monoclonal paraproteinemia 02/28/2011    Arelia Sneddon, SPT  07/18/2016, 12:06 PM  [facility] [address] [place], [place], [address] Phone: [phone]   Fax:  [phone]  Name: Jack [name] MRN: [id number] Date of Birth: 05-21-1945
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
## 2016-07-19 ENCOUNTER — Encounter: Payer: Self-pay | Admitting: Nephrology

## 2016-07-19 DIAGNOSIS — D631 Anemia in chronic kidney disease: Secondary | ICD-10-CM | POA: Diagnosis not present

## 2016-07-19 DIAGNOSIS — N186 End stage renal disease: Secondary | ICD-10-CM | POA: Diagnosis not present

## 2016-07-19 DIAGNOSIS — E1029 Type 1 diabetes mellitus with other diabetic kidney complication: Secondary | ICD-10-CM | POA: Diagnosis not present

## 2016-07-19 DIAGNOSIS — N2581 Secondary hyperparathyroidism of renal origin: Secondary | ICD-10-CM | POA: Diagnosis not present

## 2016-07-19 DIAGNOSIS — T82598A Other mechanical complication of other cardiac and vascular devices and implants, initial encounter: Secondary | ICD-10-CM | POA: Diagnosis not present

## 2016-07-19 DIAGNOSIS — D689 Coagulation defect, unspecified: Secondary | ICD-10-CM | POA: Diagnosis not present

## 2016-07-22 DIAGNOSIS — N186 End stage renal disease: Secondary | ICD-10-CM | POA: Diagnosis not present

## 2016-07-22 DIAGNOSIS — E1029 Type 1 diabetes mellitus with other diabetic kidney complication: Secondary | ICD-10-CM | POA: Diagnosis not present

## 2016-07-22 DIAGNOSIS — T82598A Other mechanical complication of other cardiac and vascular devices and implants, initial encounter: Secondary | ICD-10-CM | POA: Diagnosis not present

## 2016-07-22 DIAGNOSIS — D689 Coagulation defect, unspecified: Secondary | ICD-10-CM | POA: Diagnosis not present

## 2016-07-22 DIAGNOSIS — N2581 Secondary hyperparathyroidism of renal origin: Secondary | ICD-10-CM | POA: Diagnosis not present

## 2016-07-22 DIAGNOSIS — D631 Anemia in chronic kidney disease: Secondary | ICD-10-CM | POA: Diagnosis not present

## 2016-07-23 ENCOUNTER — Ambulatory Visit: Payer: Medicare Other | Admitting: Physical Therapy

## 2016-07-23 NOTE — Progress Notes (Signed)
HPI:   [name] is a 71 y.o. male, who is here today with her daughter to follow on some chronic medical problems.  She was last seen on 06/11/16 after ER visit. Since her last OV she has followed with Dr Jack [name], vascular. Also saw Dr Jack [name], cardiologists, on 06/17/16. He was referred to Dr Jack [name] because bradycardia.  ESRD, he is having hemodialysis 3 times per week, today morning last one. He discontinued Midodrine because it was causing headaches and elevation of BP. Today his BP after dialysis was 110's/70's, he received IVF.   Diabetes Mellitus II:   Currently on Glipizide 2.5 mg daily. 3 times per week he is still eating a snack at bed time: peanut butter and crackers. Checking BS's : 120-150. 9 pm BS is 180's-190's. Hypoglycemia: Denies.  He denies abdominal pain, nausea, vomiting, polydipsia, polyuria, or polyphagia. Hx of retinopathy, left eye blindness. He has not followed with ophthalmologists because he is afraid of intraocular injections.   Lab Results  Component Value Date   CREATININE 3.21 (H) 06/07/2016   BUN 9 06/07/2016   NA 136 06/07/2016   K 4.0 06/11/2016   CL 100 (L) 06/07/2016   CO2 26 06/07/2016    Lab Results  Component Value Date   HGBA1C 7.0 (H) 03/19/2016    Chronic pain, s/p bilateral LE amputation with phantom leg pain. He is currently on Oxycodone-Acetaminophen 5-325 mg bid as needed, usually at night. Most of the time pain during the day is not severe, it seems to be worse at night, 10/10.  He is on his wheel chair most of the time. Walks occasionally, denies skin lesion on sacrum or buttocks. Hx of constipation, attributes it mainly to some of his medications for renal disease, otherwise stable. Last bowel movement today. Bowel movement daily.   Hypothyroidism:  Currently he is on Levothyroxine 125 mg daily x 6 and 1/2 tab Mondays. Tolerating medication well, no side effects reported. He has not noted  dysphagia, palpitations, abdominal pain, changes in bowel habits, tremor, cold/heat intolerance, or abnormal weight loss.  Lab Results  Component Value Date   TSH 0.46 06/11/2016   He goes to the [place] 2 times per week and getting ready to start exercising at the [facility].  Right middle and 4th finger burn while cooking on stove about 2 weeks ago. $th finger is healing well. He is keeping 3rd finger covered with a bandage. He is keeping clean with soap and water,applying Neosporin, and using Peroxide on wound. No much pain now but a couple nights ago pain woke him up at night, severe radiated to forearm.   Hx of PVD.   Review of Systems  Constitutional: Positive for fatigue (No more than usual). Negative for activity change, appetite change, chills, fever and unexpected weight change.  HENT: Negative for mouth sores, nosebleeds, sore throat and trouble swallowing.   Eyes: Negative for redness and visual disturbance.  Respiratory: Negative for apnea, cough, shortness of breath and wheezing.   Cardiovascular: Negative for chest pain, palpitations and leg swelling.  Gastrointestinal: Positive for constipation. Negative for abdominal pain, nausea and vomiting.  Endocrine: Negative for cold intolerance, heat intolerance, polydipsia, polyphagia and polyuria.  Musculoskeletal: Positive for gait problem.  Skin: Positive for wound. Negative for rash.  Neurological: Negative for dizziness, syncope, weakness and headaches.  Psychiatric/Behavioral: Negative for confusion. The patient is not nervous/anxious.       Current Outpatient Prescriptions on File Prior to  Visit  Medication Sig Dispense Refill  . aspirin EC 81 MG tablet Take 81 mg by mouth daily.    Marland Kitchen atorvastatin (LIPITOR) 10 MG tablet Take 1 tablet (10 mg total) by mouth daily. 90 tablet 1  . calcium acetate (PHOSLO) 667 MG capsule Take 1,334 mg by mouth 3 (three) times daily with meals.    . diphenhydrAMINE (BENADRYL) 25  MG tablet Take 100 mg by mouth as directed. The morning of fistula cleaning    . glipiZIDE (GLUCOTROL) 5 MG tablet Take 0.5 tablets (2.5 mg total) by mouth daily before breakfast. 45 tablet 1  . levothyroxine (SYNTHROID, LEVOTHROID) 150 MCG tablet Take 1/2 tablet by mouth on Mondays and 1 tablet by mouth the rest of the week. 90 tablet 1  . multivitamin (RENA-VIT) TABS tablet Take 1 tablet by mouth at bedtime.    . predniSONE (DELTASONE) 20 MG tablet Take 40 mg by mouth. Take 67ms twice daily the day before fistula cleaning and 465m the day of the fistula cleaning     No current facility-administered medications on file prior to visit.      Past Medical History:  Diagnosis Date  . Anemia   . Asthma   . Contracture of joint    left knee  . Decubitus ulcer of sacral region, stage 2 03/07/2014  . Diabetes mellitus with peripheral vascular disease ([facility]  . [facility]   Started HD March 2014.  Cause of ESRD was DM.  [facility]   . Marland KitchenSRD on hemodialysis ([facility]  . Gangrene ([facility]   right BKA  . Gangrene of foot ([facility]  . Glaucoma   . Glaucoma 03/07/2014  . Heart murmur   . History of MRSA infection 04/22/2013   Bacteremia assoc w L foot wound infection Mar 2015   . Hyperparathyroidism, secondary renal ([facility]  . Hypertension   . Hypothyroidism   . MRSA bacteremia   . Multiple myeloma, without mention of having achieved remission 03/30/2012   Cytogenetic neg on 03/23/2012.  . Marland Kitcheneripheral arterial disease (HCH[facility]  . Peripheral vascular disease, unspecified (HCE[place]11/06/2012   In the past had R foot toe amps then R TMA. In 2015 had left foot toe amp > then TMA >then L BKA on 05/14/13   . Pneumonia    2012  . Thyroid disease    hyperparathyroidism   Past Surgical History:  Procedure Laterality Date  . ABDOMINAL AORTAGRAM Bilateral 11/06/2012   Procedure: ABDOMINAL AORTAGRAM;  Surgeon: ChElam DutchMD;  [facility];  Service: Cardiovascular;  Laterality: Bilateral;  . AMPUTATION Right 11/10/2012   Procedure: AMPUTATION FIRST and SECOND TOES Right Foot;  Surgeon: ChElam DutchMD;  [place] [facility] Service: Vascular;  Laterality: Right;  . AMPUTATION Left 04/07/2013   Procedure: AMPUTATION DIGIT- LEFT 1ST TOE;  Surgeon: JaMal MistyMD;  [place] [facility] Service: Vascular;  Laterality: Left;  . AMPUTATION Left 04/26/2013   Procedure: Left Foot Transmetatarsal Amputation;  Surgeon: MaNewt MinionMD;  [place] [facility] Service: Orthopedics;  Laterality: Left;  . AMPUTATION Left 05/14/2013   Procedure: AMPUTATION BELOW KNEE;  Surgeon: MaNewt MinionMD;  Location: [facility] Service: Orthopedics;  Laterality: Left;  Left Below Knee Amputation  . AMPUTATION Left 07/23/2013   Procedure: AMPUTATION BELOW KNEE;  Surgeon: MaNewt MinionMD;  [facility];  Service: Orthopedics;  Laterality: Left;  Left Below Knee Amputation Revision  . AMPUTATION Right 02/18/2014   Procedure: AMPUTATION BELOW KNEE;  Surgeon: Newt Minion, MD;  Location: [place];  Service: Orthopedics;  Laterality: Right;  . AV FISTULA PLACEMENT Left 03/25/2012   Procedure: ARTERIOVENOUS (AV) FISTULA CREATION;  Surgeon: Mal Misty, MD;  Location: [place];  Service: Vascular;  Laterality: Left;  . BELOW KNEE LEG AMPUTATION Left 05/14/2013   DR [name]  . BELOW KNEE LEG AMPUTATION Right 02/18/2014   dr duda  . CARDIAC CATHETERIZATION     approx 30 years ago  . CERVICAL DISC SURGERY    . EYE SURGERY Bilateral    CATARACTS  . I&D EXTREMITY Left 04/22/2013   Procedure: IRRIGATION AND DEBRIDEMENT LEFT FIRST TOE AMPUTATION WOUND ;  Surgeon: Mal Misty, MD;  Location: [place];  Service: Vascular;  Laterality: Left;  . I&D EXTREMITY Right 01/05/2014   Procedure: IRRIGATION AND DEBRIDEMENT Right Heel Ulcer;  Surgeon: Mcarthur Rossetti, MD;  Location: [place];  Service: Orthopedics;  Laterality: Right;  Surgeon Available after 5PM  .  INSERTION OF DIALYSIS CATHETER Right 03/19/2012   Procedure: INSERTION OF DIALYSIS CATHETER;  Surgeon: Mal Misty, MD;  Location: [place];  Service: Vascular;  Laterality: Right;  Right Internal Jugular  . LIGATION OF COMPETING BRANCHES OF ARTERIOVENOUS FISTULA Left 05/08/2012   Procedure: LIGATION OF COMPETING BRANCHES OF ARTERIOVENOUS FISTULA;  Surgeon: Mal Misty, MD;  Location: [place];  Service: Vascular;  Laterality: Left;  Ultrasound guided  . REPAIR QUADRICEPS/HAMSTRING MUSCLES Left 05/20/2014   Procedure: Left Hamstring Release;  Surgeon: Newt Minion, MD;  Location: [place];  Service: Orthopedics;  Laterality: Left;  . REVISON OF ARTERIOVENOUS FISTULA Left 01/30/2016   Procedure: CEPHALIC VEIN TURNDOWN TO LEFT UPPER ARM;  Surgeon: Elam Dutch, MD;  Location: [place];  Service: Vascular;  Laterality: Left;  . STUMP REVISION Right 05/20/2014   Procedure: Revision Right Below Knee Amputation;  Surgeon: Newt Minion, MD;  Location: [place];  Service: Orthopedics;  Laterality: Right;  . TEE WITHOUT CARDIOVERSION N/A 04/20/2013   Procedure: TRANSESOPHAGEAL ECHOCARDIOGRAM (TEE);  Surgeon: Josue Hector, MD;  [facility];  Service: Cardiovascular;  Laterality: N/A;  . THYROIDECTOMY    . TOE AMPUTATION     D/C 04-30-13  . TRANSMETATARSAL AMPUTATION Left 12/16/2012   Procedure: TRANSMETATARSAL AMPUTATION AND VAC PLACEMENT;  Surgeon: Elam Dutch, MD;  Location: [place];  Service: Vascular;  Laterality: Left;    Allergies  Allergen Reactions  . Bee Venom Anaphylaxis  . Lisinopril Cough  . Ivp Dye [Iodinated Diagnostic Agents] Hives  . Morphine And Related Hives and Other (See Comments)    Bradycardia states patient    Social History   Social History  . Marital status: Divorced    Spouse name: N/A  . Number of children: N/A  . Years of education: N/A   Social History Main Topics  . Smoking status: Former Smoker    Types: Cigarettes    Quit date: 03/19/1982  . Smokeless  tobacco: Never Used  . Alcohol use No     Comment: formerly  . Drug use: No  . Sexual activity: No   Other Topics Concern  . None   Social History Narrative  . None    Vitals:   07/24/16 1344  BP: 134/80  Pulse: 61  Resp: 12   There is no height or weight on  file to calculate BMI.   Physical Exam  Nursing note and vitals reviewed. Constitutional: He is oriented to person, place, and time. He appears well-developed and well-nourished. No distress.  HENT:  Head: Atraumatic.  Mouth/Throat: Oropharynx is clear and moist and mucous membranes are normal.  Eyes: Conjunctivae are normal. Right eye exhibits abnormal extraocular motion. Left eye exhibits normal extraocular motion. Left pupil is not round and not reactive. Pupils are unequal.  Abnormal left eye abduction. Pupil right eye sluggish. Left eye blind (cannot see my hand)  Cardiovascular: Normal rate.  An irregular rhythm present.  Murmur (SEM II/VI base) heard. Capillary refill delayed. Cold fingers.  Respiratory: Effort normal and breath sounds normal. No respiratory distress.  GI: Soft. There is no tenderness.  Musculoskeletal: He exhibits no edema.       Hands: BKA bilateral.  Lymphadenopathy:    He has no cervical adenopathy.  Neurological: He is alert and oriented to person, place, and time. He has normal strength.  He is in a wheelchair.  Skin: Skin is warm. Abrasion noted. No erythema.  Middle right finger tip with ulcer, necrotic tissue, no erythema. See musculoskeletal graphic for details.  Psychiatric: He has a normal mood and affect.  Adequately groomed, good eye contact.    ASSESSMENT AND PLAN:   [name] was seen today for follow-up.  Diagnoses and all orders for this visit:  Lab Results  Component Value Date   TSH 0.93 07/24/2016   Lab Results  Component Value Date   HGBA1C 7.2 (H) 07/24/2016    Diabetes mellitus with peripheral vascular disease ([place])   HgA1C goal < 8.0 No  changes in current management for now. Avoid bedtime snacks. Regular exercise and healthy diet with avoidance of added sugar food intake is an important part of treatment and recommended. Regular eye examinations and periodic dental. F/U in 5-6 months  -     Hemoglobin A1c -     Fructosamine  Open wound of right middle finger without damage to nail, initial encounter  With presence of necrotic tissue. Avoid peroxide on wound. Keep it clean with soap and water. HH will be arranged.   -     Ambulatory referral to [facility] -     Ambulatory referral to [facility]  Phantom limb syndrome with pain ([place])   Side effects of Percocet discussed. No changes in current management. Rx x 2 given today. Med contract 08/22/15. [place] controlled med web site reviewed. F/U in 3-4 months.  -     oxyCODONE-acetaminophen (ROXICET) 5-325 MG tablet; Take 1 tablet by mouth every 12 (twelve) hours as needed for moderate pain or severe pain.  Hypothyroidism following radioiodine therapy  No changes in current management, will follow labs done today and will give further recommendations accordingly.  -     TSH -     T4, free  End-stage renal disease on hemodialysis ([place])  Has had some issues with hemodialysis access, a few AV fistulas have failed/occluded. The pan is to try RUE AV fistula but can not be done until right finger burn heals. Adequate nutrition discussed, high protein diet.  Diabetic retinopathy of left eye with macular edema associated with type 2 diabetes mellitus, unspecified retinopathy severity ([place])  Strongly recommended keeping appts with ophthalmologists to start treatment.     2:02 to 2:45 pm  > 50% was dedicated to discussion of Dx, prognosis,and some side effects of medications.Coordination of care, counseling about nutrition and wound care.Injury prevention and  skin care. We also discussed palliative care program, which I think he will benefit from, he agrees with  referral.   -[name] was advised to return sooner than planned today if new concerns arise.       Betty G. Martinique, MD  [facility]. [place] office.

## 2016-07-24 ENCOUNTER — Encounter: Payer: Self-pay | Admitting: Family Medicine

## 2016-07-24 ENCOUNTER — Ambulatory Visit (INDEPENDENT_AMBULATORY_CARE_PROVIDER_SITE_OTHER): Payer: Medicare Other | Admitting: Family Medicine

## 2016-07-24 VITALS — BP 134/80 | HR 61 | Resp 12 | Ht 70.0 in

## 2016-07-24 DIAGNOSIS — G546 Phantom limb syndrome with pain: Secondary | ICD-10-CM

## 2016-07-24 DIAGNOSIS — T82598A Other mechanical complication of other cardiac and vascular devices and implants, initial encounter: Secondary | ICD-10-CM | POA: Diagnosis not present

## 2016-07-24 DIAGNOSIS — E1151 Type 2 diabetes mellitus with diabetic peripheral angiopathy without gangrene: Secondary | ICD-10-CM

## 2016-07-24 DIAGNOSIS — N186 End stage renal disease: Secondary | ICD-10-CM

## 2016-07-24 DIAGNOSIS — E1029 Type 1 diabetes mellitus with other diabetic kidney complication: Secondary | ICD-10-CM | POA: Diagnosis not present

## 2016-07-24 DIAGNOSIS — Z992 Dependence on renal dialysis: Secondary | ICD-10-CM

## 2016-07-24 DIAGNOSIS — E890 Postprocedural hypothyroidism: Secondary | ICD-10-CM

## 2016-07-24 DIAGNOSIS — S61202A Unspecified open wound of right middle finger without damage to nail, initial encounter: Secondary | ICD-10-CM

## 2016-07-24 DIAGNOSIS — E11311 Type 2 diabetes mellitus with unspecified diabetic retinopathy with macular edema: Secondary | ICD-10-CM

## 2016-07-24 DIAGNOSIS — N2581 Secondary hyperparathyroidism of renal origin: Secondary | ICD-10-CM | POA: Diagnosis not present

## 2016-07-24 DIAGNOSIS — D631 Anemia in chronic kidney disease: Secondary | ICD-10-CM | POA: Diagnosis not present

## 2016-07-24 DIAGNOSIS — D689 Coagulation defect, unspecified: Secondary | ICD-10-CM | POA: Diagnosis not present

## 2016-07-24 LAB — HEMOGLOBIN A1C: Hgb A1c MFr Bld: 7.2 % — ABNORMAL HIGH (ref 4.6–6.5)

## 2016-07-24 LAB — TSH: TSH: 0.93 u[IU]/mL (ref 0.35–4.50)

## 2016-07-24 LAB — T4, FREE: FREE T4: 1.11 ng/dL (ref 0.60–1.60)

## 2016-07-24 MED ORDER — OXYCODONE-ACETAMINOPHEN 5-325 MG PO TABS: 1.0000 | ORAL_TABLET | Freq: Two times a day (BID) | ORAL | 0 refills | Status: DC | PRN

## 2016-07-24 NOTE — Patient Instructions (Signed)
A few things to remember from today's visit:   Diabetes mellitus with peripheral vascular disease ([place]) - Plan: Hemoglobin A1c, Fructosamine  Essential hypertension  Open wound of right middle finger without damage to nail, initial encounter - Plan: Ambulatory referral to [facility]  Phantom limb syndrome with pain ([place]) - Plan: oxyCODONE-acetaminophen (ROXICET) 5-325 MG tablet, DISCONTINUED: oxyCODONE-acetaminophen (ROXICET) 5-325 MG tablet  Hypothyroidism following radioiodine therapy - Plan: TSH, T4, free  Avoid bedtime snacking.  HgA1C goal < 8.0. Avoid sugar added food:regular soft drinks, energy drinks, and sports drinks. candy. cakes. cookies. pies and cobblers. sweet rolls, pastries, and donuts. fruit drinks, such as fruitades and fruit punch. dairy desserts, such as ice cream  Mediterranean diet has showed benefits for sugar control.  How much and what type of carbohydrate foods are important for managing diabetes. The balance between how much insulin is in your body and the carbohydrate you eat makes a difference in your blood glucose levels.  Fasting blood sugar ideally 140 or less, 2 hours after meals less than 180.   Regular exercise also will help with controlling disease, daily brisk walking as tolerated for 15-30 min definitively will help.   Avoid skipping meals, blood sugar might drop and cause serious problems. Remember checking feet periodically, good dental hygiene, and annual eye exam.     Please be sure medication list is accurate. If a new problem present, please set up appointment sooner than planned today.

## 2016-07-25 ENCOUNTER — Encounter: Payer: Self-pay | Admitting: Physical Therapy

## 2016-07-25 ENCOUNTER — Ambulatory Visit: Payer: Medicare Other | Admitting: Physical Therapy

## 2016-07-25 ENCOUNTER — Ambulatory Visit: Payer: Medicare Other | Admitting: Family Medicine

## 2016-07-25 DIAGNOSIS — R2681 Unsteadiness on feet: Secondary | ICD-10-CM

## 2016-07-25 DIAGNOSIS — R2689 Other abnormalities of gait and mobility: Secondary | ICD-10-CM | POA: Diagnosis not present

## 2016-07-25 DIAGNOSIS — M6281 Muscle weakness (generalized): Secondary | ICD-10-CM | POA: Diagnosis not present

## 2016-07-25 DIAGNOSIS — M6249 Contracture of muscle, multiple sites: Secondary | ICD-10-CM

## 2016-07-25 NOTE — Therapy (Signed)
[facility] [address] [place], [place], [address] Phone: [phone]   Fax:  [phone]  Physical Therapy Treatment  Patient Details  Name: Jack [name] MRN: [id number] Date of Birth: August 24, 1945 Referring Provider: Meridee Score MD   Encounter Date: 07/25/2016      PT End of Session - 07/25/16 1317    Visit Number 8   Number of Visits 18   Date for PT Re-Evaluation 07/26/16   Authorization Type Medicare & G-codes every 10th visit    PT Start Time 0848   PT Stop Time 0932   PT Time Calculation (min) 44 min   Equipment Utilized During Treatment Gait belt   Activity Tolerance Patient tolerated treatment well   Behavior During Therapy [facility] for tasks assessed/performed      Past Medical History:  Diagnosis Date  . Anemia   . Asthma   . Contracture of joint    left knee  . Decubitus ulcer of sacral region, stage 2 03/07/2014  . Diabetes mellitus with peripheral vascular disease ([place])   . End-stage renal disease on hemodialysis [facility])    Started HD March 2014.  Cause of ESRD was DM.  Gets HD at [facility] on [address] on MWF schedule.   Marland Kitchen ESRD on hemodialysis ([place])   . Gangrene ([place])    right BKA  . Gangrene of foot ([place])   . Glaucoma   . Glaucoma 03/07/2014  . Heart murmur   . History of MRSA infection 04/22/2013   Bacteremia assoc w L foot wound infection Mar 2015   . Hyperparathyroidism, secondary renal ([place])   . Hypertension   . Hypothyroidism   . MRSA bacteremia   . Multiple myeloma, without mention of having achieved remission 03/30/2012   Cytogenetic neg on 03/23/2012.  Marland Kitchen Peripheral arterial disease ([place])   . Peripheral vascular disease, unspecified ([place]) 11/19/2012   In the past had R foot toe amps then R TMA. In 2015 had left foot toe amp > then TMA >then L BKA on 05/14/13   . Pneumonia    2012  . Thyroid disease    hyperparathyroidism    Past Surgical History:  Procedure Laterality Date   . ABDOMINAL AORTAGRAM Bilateral 11/06/2012   Procedure: ABDOMINAL AORTAGRAM;  Surgeon: Elam Dutch, MD;  Location: [facility] CATH LAB;  Service: Cardiovascular;  Laterality: Bilateral;  . AMPUTATION Right 11/10/2012   Procedure: AMPUTATION FIRST and SECOND TOES Right Foot;  Surgeon: Elam Dutch, MD;  Location: [place];  Service: Vascular;  Laterality: Right;  . AMPUTATION Left 04/07/2013   Procedure: AMPUTATION DIGIT- LEFT 1ST TOE;  Surgeon: Mal Misty, MD;  Location: [place];  Service: Vascular;  Laterality: Left;  . AMPUTATION Left 04/26/2013   Procedure: Left Foot Transmetatarsal Amputation;  Surgeon: Newt Minion, MD;  Location: [place];  Service: Orthopedics;  Laterality: Left;  . AMPUTATION Left 05/14/2013   Procedure: AMPUTATION BELOW KNEE;  Surgeon: Newt Minion, MD;  Location: [place];  Service: Orthopedics;  Laterality: Left;  Left Below Knee Amputation  . AMPUTATION Left 07/23/2013   Procedure: AMPUTATION BELOW KNEE;  Surgeon: Newt Minion, MD;  Location: [place];  Service: Orthopedics;  Laterality: Left;  Left Below Knee Amputation Revision  . AMPUTATION Right 02/18/2014   Procedure: AMPUTATION BELOW KNEE;  Surgeon: Newt Minion, MD;  Location: [place];  Service: Orthopedics;  Laterality: Right;  . AV FISTULA PLACEMENT Left 03/25/2012   Procedure: ARTERIOVENOUS (AV)  FISTULA CREATION;  Surgeon: Mal Misty, MD;  Location: [place];  Service: Vascular;  Laterality: Left;  . BELOW KNEE LEG AMPUTATION Left 05/14/2013   DR [name]  . BELOW KNEE LEG AMPUTATION Right 02/18/2014   dr duda  . CARDIAC CATHETERIZATION     approx 30 years ago  . CERVICAL DISC SURGERY    . EYE SURGERY Bilateral    CATARACTS  . I&D EXTREMITY Left 04/22/2013   Procedure: IRRIGATION AND DEBRIDEMENT LEFT FIRST TOE AMPUTATION WOUND ;  Surgeon: Mal Misty, MD;  Location: [place];  Service: Vascular;  Laterality: Left;  . I&D EXTREMITY Right 01/05/2014   Procedure: IRRIGATION AND DEBRIDEMENT Right Heel Ulcer;  Surgeon:  Mcarthur Rossetti, MD;  Location: [place];  Service: Orthopedics;  Laterality: Right;  Surgeon Available after 5PM  . INSERTION OF DIALYSIS CATHETER Right 03/19/2012   Procedure: INSERTION OF DIALYSIS CATHETER;  Surgeon: Mal Misty, MD;  Location: [place];  Service: Vascular;  Laterality: Right;  Right Internal Jugular  . LIGATION OF COMPETING BRANCHES OF ARTERIOVENOUS FISTULA Left 05/08/2012   Procedure: LIGATION OF COMPETING BRANCHES OF ARTERIOVENOUS FISTULA;  Surgeon: Mal Misty, MD;  Location: [place];  Service: Vascular;  Laterality: Left;  Ultrasound guided  . REPAIR QUADRICEPS/HAMSTRING MUSCLES Left 05/20/2014   Procedure: Left Hamstring Release;  Surgeon: Newt Minion, MD;  Location: [place];  Service: Orthopedics;  Laterality: Left;  . REVISON OF ARTERIOVENOUS FISTULA Left 01/30/2016   Procedure: CEPHALIC VEIN TURNDOWN TO LEFT UPPER ARM;  Surgeon: Elam Dutch, MD;  Location: [place];  Service: Vascular;  Laterality: Left;  . STUMP REVISION Right 05/20/2014   Procedure: Revision Right Below Knee Amputation;  Surgeon: Newt Minion, MD;  Location: [place];  Service: Orthopedics;  Laterality: Right;  . TEE WITHOUT CARDIOVERSION N/A 04/20/2013   Procedure: TRANSESOPHAGEAL ECHOCARDIOGRAM (TEE);  Surgeon: Josue Hector, MD;  Location[facility] ENDOSCOPY;  Service: Cardiovascular;  Laterality: N/A;  . THYROIDECTOMY    . TOE AMPUTATION     D/C 04-30-13  . TRANSMETATARSAL AMPUTATION Left 12/16/2012   Procedure: TRANSMETATARSAL AMPUTATION AND VAC PLACEMENT;  Surgeon: Elam Dutch, MD;  Location: [facility] OR;  Service: Vascular;  Laterality: Left;        Subjective Assessment - 07/25/16 0840    Subjective No complaints, pain or falls reported. Patient said he will see Dr. Oneida Alar on 08/22/16 and that he is waiting on wound care treatment and healing of the tip of his finger before fistula can be placed for dialysis.   Patient is accompained by: Family member   Pertinent History bilateral transtibial  amputations, anemia, asthma, contracture of L knee, decubitus ulcer of sacral region, DM with PVD, ESRD, glaucoma, heart murmur, HTN, hypothyroidism, multiple myeloma, PAD   Limitations Lifting;Standing;Walking;House hold activities   Patient Stated Goals walk without a person assisting him (walk with device only)    Currently in Pain? No/denies   Pain Score 0-No pain       Vital Signs throughout PT session:  At rest: HR (taken manually) = 52bpm  After 64f ambulation: HR (taken manually) = 54bpm  After ambulating up 150 feet: HR (taken manually) = 53bpm  After 40 ft ambulation: HR (taken manually) =58bpm         [facility] Adult PT Treatment/Exercise - 07/25/16 0927      Transfers   Transfers Sit to Stand;Stand to Sit;Stand Pivot Transfers   Sit to Stand 4: Min guard  Sit to Stand Details Verbal cues for precautions/safety;Verbal cues for technique;Verbal cues for safe use of DME/AE   Stand to Sit 4: Min guard   Stand to Sit Details (indicate cue type and reason) Verbal cues for precautions/safety;Verbal cues for safe use of DME/AE;Verbal cues for sequencing   Stand Pivot Transfers 4: Min guard   Stand Pivot Transfer Details (indicate cue type and reason) VC's to move feet to pivot     Ambulation/Gait   Ambulation/Gait Yes   Ambulation/Gait Assistance 4: Min guard   Ambulation/Gait Assistance Details VC's for posture and positioning of rollator to not let it get too far in front of him   Ambulation Distance (Feet) 200 Feet  20 feet, 10 feet (TUG), 150 feet, 48fet   Assistive device Rollator;Prostheses   Gait Pattern Step-through pattern;Right flexed knee in stance;Left flexed knee in stance;Wide base of support;Poor foot clearance - left;Poor foot clearance - right;Trunk flexed  Poor foot clearance presents as pt fatigues   Ambulation Surface Level;Indoor     Neuro Re-ed    Neuro Re-ed Details  Pt. reached 3" anteriorally as well as all the way to the floor with left UE support  on rollator     Prosthetics   Current prosthetic wear tolerance (days/week)  daily    Current prosthetic wear tolerance (#hours/day)  all awake hours every day   Residual limb condition  Denies any skin integrity issues.             PT Short Term Goals - 07/25/16 1327      PT SHORT TERM GOAL #1   Title Patient will verbalize understanding and return demonstration of initial HEP. (TARGET DATE: 06/28/2016)    Baseline Partially MET Pt verbalized & demonstrated basic understanding of initial HEP but needs further instruction   Time 1   Period Months   Status Partially Met     PT SHORT TERM GOAL #2   Title Patient will demonstrate ability to reach 5 inches anteriorly and reach within 10 inches from the floor unilateral UE support on RW with supervision to indicate a decrease in his risk of falling. (TARGET DATE: 06/28/2016)  NEW Target 07/26/2016     Baseline 07/26/16: reached 3" anteriorally, and touched the floor with left UE support   Status Partially Met     PT SHORT TERM GOAL #3   Title PT will perform TUG, and short term goal will be set. (TARGET DATE: 06/28/2016) NEW Target Date 07/26/2016   Baseline Baseline: 07/25/16: TUG= 45.06 seconds with rollator (PT to set goal)   Time 1   Period Months   Status Achieved     PT SHORT TERM GOAL #4   Title Patient will demonstrate ability to ambulate 150 feet on indoor surfaces with rollator style walker and supervision to indicate a decrease in his risk of falling. (TARGET DATE: 06/28/2016)  New Target Date 07/26/2016   Baseline 07/25/16: Pt ambulated 150 feet on indoor surfaces with rollator with min guard for first 100 feet and mod guard remaining 50 feet.   Time 1   Period Months   Status On-going     PT SHORT TERM GOAL #5   Title Patient ambulates 10' in parallel bars with prostheses with max assist of 1 person with 2nd person supervising for safety.  (Target Date: 09/16/2014)   Baseline MET 09/13/2014   Time 1   Period Months   Status  Achieved  PT Long Term Goals - 07/09/16 0926      PT LONG TERM GOAL #1   Title Patient will verbalize understanding and return demonstration of ongoing HEP program. (TARGET DATE: 07/26/2016)    Time 2   Period Months   Status On-going     PT LONG TERM GOAL #2   Title Patient will ambulate 500 feet on firm, paved outdoor surfaces with family support to indicate a decrease risk of falling when returning to community ambulation with family support. (TARGET DATE: 07/26/2016)    Time 2   Period Months   Status On-going     PT LONG TERM GOAL #3   Title Patient will be able to ambulate 100 feet with rollator style walker with mod I to indicate a decrease in his risk of falling while ambulating within his house. (TARGET DATE: 07/26/2016)    Time 2   Period Months   Status On-going     PT LONG TERM GOAL #4   Title Patient will be able to ambulate over ramp/curb with rollator style walker with family assistance to indicate a decrease in his risk of falling when returning to community ambulation with family. (TARGET DATE: 07/26/2016)    Time 2   Period Months   Status On-going     PT LONG TERM GOAL #5   Title Patient will demonstrate ability to reach 8 inches anteriorly and reach within 8 inches from the floor unilateral UE support on RW with supervision to indicate a decrease in his risk of falling. (TARGET DATE: 06/28/2016)    Time 2   Period Months   Status On-going               Plan - 07/25/16 1347    Clinical Impression Statement Today's skilled session focused on checking STG's. Patient is progressing well towards all STG's making steady gains on all goals. He improved his standing balance reaching safely all the way to the floor with unilateral UE support on rollator; however demonstrated smaller gains with anterior reach, reaching 3 inches anteriorly. TUG performed today using rollator with a baseline of 45.06 seconds. Patient demonstrated improved endurance  ambulating 150 feet with rollator before resting with HR WFL; however noted decreased bilateral foot clearance and fatigue as well increase to min assist at 100 feet.  Patient is progressing well towards LTG's and will benefit from skilled PT to meet unmet goals. Primary PT to renew.    Rehab Potential Good   Clinical Impairments Affecting Rehab Potential bilateral transtibial amputations, anemia, asthma, contracture of L knee, decubitus ulcer of sacral region, DM with PVD, ESRD, glaucoma, heart murmur, HTN, hypothyroidism, multiple myeloma, PAD   PT Frequency 2x / week   PT Duration Other (comment)   PT Treatment/Interventions ADLs/Self Care Home Management;Neuromuscular re-education;Balance training;Therapeutic exercise;Therapeutic activities;Functional mobility training;Stair training;Gait training;DME Instruction;Patient/family education;Prosthetic Training;Manual techniques;Scar mobilization;Passive range of motion   PT Next Visit Plan Continue to monitor patient's HR response throughout session, review and confirm understanding/performance of HEP, progress prosthetic gait with rollator with pivot turning and sit<>stand with rollator, decreasing stabilization of rollator against wall as indicated   Consulted and Agree with Plan of Care Patient      Patient will benefit from skilled therapeutic intervention in order to improve the following deficits and impairments:  Abnormal gait, Decreased activity tolerance, Decreased balance, Decreased coordination, Decreased range of motion, Decreased safety awareness, Decreased knowledge of use of DME, Decreased knowledge of precautions, Decreased endurance, Decreased strength, Difficulty walking, Prosthetic Dependency,  Postural dysfunction  Visit Diagnosis: Muscle weakness (generalized)  Unsteadiness on feet  Other abnormalities of gait and mobility     Problem List Patient Active Problem List   Diagnosis Date Noted  . Dyslipidemia 11/21/2015  .  Diabetic retinopathy ([place])- left eye 10/02/2015  . Complications, amputation stump late ([place]) 05/20/2014  . Weakness generalized 04/07/2014  . Sepsis ([place]) 04/07/2014  . Decubitus ulcer, stage II 04/07/2014  . Decubitus ulcer of ankle   . Fatigue   . Wound infection 04/06/2014  . Decubitus ulcer of sacral region, stage 2 03/07/2014  . Glaucoma 03/07/2014  . Gout 03/07/2014  . Below knee amputation status ([place]) 02/18/2014  . Osteomyelitis ([place]) 01/04/2014  . Phantom limb ([place]) 12/14/2013  . Type 2 diabetes mellitus with diabetic foot infection ([place])   . Diabetes mellitus with peripheral vascular disease ([place])   . Asthma 05/17/2013  . History of MRSA infection 04/22/2013  . Peripheral vascular disease ([place]) 11/19/2012  . End-stage renal disease on hemodialysis ([place]) 05/05/2012  . Kahler disease ([place]) 03/30/2012  . Anemia in chronic kidney disease 03/19/2012  . Hypothyroidism following radioiodine therapy 03/19/2012  . Anemia, iron deficiency 03/19/2012  . Hypertension 03/18/2012  . Chronic kidney disease (CKD), stage V ([place]) 03/18/2012  . Cardiac conduction disorder 03/18/2012  . MGUS (monoclonal gammopathy of unknown significance) 02/28/2011  . Monoclonal paraproteinemia 02/28/2011    Vaughan Sine 07/25/2016, 3:20 PM  [facility] [address] [place], [place], [address] Phone: [phone]   Fax:  [phone]  Name: Jack [name] MRN: [id number] Date of Birth: 04/14/1945  This note has been reviewed and edited by supervising CI.  Willow Ora, PTA, [facility] [address] [place], [place] [address] [phone] 07/25/16, 4:20 PM

## 2016-07-26 ENCOUNTER — Telehealth: Payer: Self-pay | Admitting: Family Medicine

## 2016-07-26 DIAGNOSIS — N186 End stage renal disease: Secondary | ICD-10-CM | POA: Diagnosis not present

## 2016-07-26 DIAGNOSIS — T82598A Other mechanical complication of other cardiac and vascular devices and implants, initial encounter: Secondary | ICD-10-CM | POA: Diagnosis not present

## 2016-07-26 DIAGNOSIS — N2581 Secondary hyperparathyroidism of renal origin: Secondary | ICD-10-CM | POA: Diagnosis not present

## 2016-07-26 DIAGNOSIS — E1029 Type 1 diabetes mellitus with other diabetic kidney complication: Secondary | ICD-10-CM | POA: Diagnosis not present

## 2016-07-26 DIAGNOSIS — D631 Anemia in chronic kidney disease: Secondary | ICD-10-CM | POA: Diagnosis not present

## 2016-07-26 DIAGNOSIS — D689 Coagulation defect, unspecified: Secondary | ICD-10-CM | POA: Diagnosis not present

## 2016-07-26 LAB — FRUCTOSAMINE: FRUCTOSAMINE: 384 umol/L — AB (ref 190–270)

## 2016-07-26 MED ORDER — ONETOUCH DELICA LANCETS 33G MISC: 3 refills | Status: DC

## 2016-07-26 MED ORDER — GLUCOSE BLOOD VI STRP: ORAL_STRIP | 3 refills | Status: DC

## 2016-07-26 NOTE — Telephone Encounter (Signed)
Rxs sent

## 2016-07-26 NOTE — Telephone Encounter (Signed)
° ° ° °  Pt call to say he picked up a one touch verio. He need the stripes for this meter called in to the below [place]

## 2016-07-29 DIAGNOSIS — E1029 Type 1 diabetes mellitus with other diabetic kidney complication: Secondary | ICD-10-CM | POA: Diagnosis not present

## 2016-07-29 DIAGNOSIS — D631 Anemia in chronic kidney disease: Secondary | ICD-10-CM | POA: Diagnosis not present

## 2016-07-29 DIAGNOSIS — N186 End stage renal disease: Secondary | ICD-10-CM | POA: Diagnosis not present

## 2016-07-29 DIAGNOSIS — T82598A Other mechanical complication of other cardiac and vascular devices and implants, initial encounter: Secondary | ICD-10-CM | POA: Diagnosis not present

## 2016-07-29 DIAGNOSIS — D689 Coagulation defect, unspecified: Secondary | ICD-10-CM | POA: Diagnosis not present

## 2016-07-29 DIAGNOSIS — N2581 Secondary hyperparathyroidism of renal origin: Secondary | ICD-10-CM | POA: Diagnosis not present

## 2016-07-31 DIAGNOSIS — N2581 Secondary hyperparathyroidism of renal origin: Secondary | ICD-10-CM | POA: Diagnosis not present

## 2016-07-31 DIAGNOSIS — D689 Coagulation defect, unspecified: Secondary | ICD-10-CM | POA: Diagnosis not present

## 2016-07-31 DIAGNOSIS — D631 Anemia in chronic kidney disease: Secondary | ICD-10-CM | POA: Diagnosis not present

## 2016-07-31 DIAGNOSIS — T82598A Other mechanical complication of other cardiac and vascular devices and implants, initial encounter: Secondary | ICD-10-CM | POA: Diagnosis not present

## 2016-07-31 DIAGNOSIS — E1029 Type 1 diabetes mellitus with other diabetic kidney complication: Secondary | ICD-10-CM | POA: Diagnosis not present

## 2016-07-31 DIAGNOSIS — N186 End stage renal disease: Secondary | ICD-10-CM | POA: Diagnosis not present

## 2016-08-01 NOTE — Therapy (Signed)
[facility] [address] [place], [place], [address] Phone: [phone]   Fax:  [phone]  Physical Therapy Treatment  Patient Details  Name: Jack [name] MRN: [id number] Date of Birth: [birth year] Referring Provider: Meridee Score MD   Encounter Date: 07/25/2016    Past Medical History:  Diagnosis Date  . Anemia   . Asthma   . Contracture of joint    left knee  . Decubitus ulcer of sacral region, stage 2 03/07/2014  . Diabetes mellitus with peripheral vascular disease ([place])   . End-stage renal disease on hemodialysis [facility])    Started HD March 2014.  Cause of ESRD was DM.  Gets HD at [facility] on [place] on MWF schedule.   Marland Kitchen ESRD on hemodialysis ([place])   . Gangrene ([place])    right BKA  . Gangrene of foot ([place])   . Glaucoma   . Glaucoma 03/07/2014  . Heart murmur   . History of MRSA infection 04/22/2013   Bacteremia assoc w L foot wound infection Mar 2015   . Hyperparathyroidism, secondary renal ([place])   . Hypertension   . Hypothyroidism   . MRSA bacteremia   . Multiple myeloma, without mention of having achieved remission 03/30/2012   Cytogenetic neg on 03/23/2012.  Marland Kitchen Peripheral arterial disease ([place])   . Peripheral vascular disease, unspecified ([place]) 11/19/2012   In the past had R foot toe amps then R TMA. In 2015 had left foot toe amp > then TMA >then L BKA on 05/14/13   . Pneumonia    2012  . Thyroid disease    hyperparathyroidism    Past Surgical History:  Procedure Laterality Date  . ABDOMINAL AORTAGRAM Bilateral 11/06/2012   Procedure: ABDOMINAL AORTAGRAM;  Surgeon: Elam Dutch, MD;  Location: [facility] C[facility] LAB;  Service: Cardiovascular;  Laterality: Bilateral;  . AMPUTATION Right 11/10/2012   Procedure: AMPUTATION FIRST and SECOND TOES Right Foot;  Surgeon: Elam Dutch, MD;  Location: [place];  Service: Vascular;  Laterality: Right;  . AMPUTATION Left 04/07/2013   Procedure: AMPUTATION DIGIT-  LEFT 1ST TOE;  Surgeon: Mal Misty, MD;  Location: [place];  Service: Vascular;  Laterality: Left;  . AMPUTATION Left 04/26/2013   Procedure: Left Foot Transmetatarsal Amputation;  Surgeon: Newt Minion, MD;  Location: [place];  Service: Orthopedics;  Laterality: Left;  . AMPUTATION Left 05/14/2013   Procedure: AMPUTATION BELOW KNEE;  Surgeon: Newt Minion, MD;  Location: [place];  Service: Orthopedics;  Laterality: Left;  Left Below Knee Amputation  . AMPUTATION Left 07/23/2013   Procedure: AMPUTATION BELOW KNEE;  Surgeon: Newt Minion, MD;  Location: [place];  Service: Orthopedics;  Laterality: Left;  Left Below Knee Amputation Revision  . AMPUTATION Right 02/18/2014   Procedure: AMPUTATION BELOW KNEE;  Surgeon: Newt Minion, MD;  Location: [place];  Service: Orthopedics;  Laterality: Right;  . AV FISTULA PLACEMENT Left 03/25/2012   Procedure: ARTERIOVENOUS (AV) FISTULA CREATION;  Surgeon: Mal Misty, MD;  Location: [facility];  Service: Vascular;  Laterality: Left;  . BELOW KNEE LEG AMPUTATION Left 05/14/2013   DR [name]  . BELOW KNEE LEG AMPUTATION Right 02/18/2014   dr duda  . CARDIAC CATHETERIZATION     approx 30 years ago  . CERVICAL DISC SURGERY    . EYE SURGERY Bilateral    CATARACTS  . I&D EXTREMITY Left 04/22/2013   Procedure: IRRIGATION AND DEBRIDEMENT LEFT FIRST TOE AMPUTATION WOUND ;  Surgeon: Mal Misty, MD;  Location: [place];  Service: Vascular;  Laterality: Left;  . I&D EXTREMITY Right 01/05/2014   Procedure: IRRIGATION AND DEBRIDEMENT Right Heel Ulcer;  Surgeon: Mcarthur Rossetti, MD;  Location: [place];  Service: Orthopedics;  Laterality: Right;  Surgeon Available after 5PM  . INSERTION OF DIALYSIS CATHETER Right 03/19/2012   Procedure: INSERTION OF DIALYSIS CATHETER;  Surgeon: Mal Misty, MD;  Location: [place];  Service: Vascular;  Laterality: Right;  Right Internal Jugular  . LIGATION OF COMPETING BRANCHES OF ARTERIOVENOUS FISTULA Left 05/08/2012   Procedure: LIGATION OF  COMPETING BRANCHES OF ARTERIOVENOUS FISTULA;  Surgeon: Mal Misty, MD;  Location: [place];  Service: Vascular;  Laterality: Left;  Ultrasound guided  . REPAIR QUADRICEPS/HAMSTRING MUSCLES Left 05/20/2014   Procedure: Left Hamstring Release;  Surgeon: Newt Minion, MD;  Location: [place];  Service: Orthopedics;  Laterality: Left;  . REVISON OF ARTERIOVENOUS FISTULA Left 01/30/2016   Procedure: CEPHALIC VEIN TURNDOWN TO LEFT UPPER ARM;  Surgeon: Elam Dutch, MD;  Location: [place];  Service: Vascular;  Laterality: Left;  . STUMP REVISION Right 05/20/2014   Procedure: Revision Right Below Knee Amputation;  Surgeon: Newt Minion, MD;  Location: [place];  Service: Orthopedics;  Laterality: Right;  . TEE WITHOUT CARDIOVERSION N/A 04/20/2013   Procedure: TRANSESOPHAGEAL ECHOCARDIOGRAM (TEE);  Surgeon: Josue Hector, MD;  Location: [facility] ENDOSCOPY;  Service: Cardiovascular;  Laterality: N/A;  . THYROIDECTOMY    . TOE AMPUTATION     D/C 04-30-13  . TRANSMETATARSAL AMPUTATION Left 12/16/2012   Procedure: TRANSMETATARSAL AMPUTATION AND VAC PLACEMENT;  Surgeon: Elam Dutch, MD;  Location: [place];  Service: Vascular;  Laterality: Left;    There were no vitals filed for this visit.                                 PT Short Term Goals - 08/01/16 1924      PT SHORT TERM GOAL #1   Title Patient will verbalize understanding and return demonstration of updated HEP. Target 08/23/2016     Time 4   Period Weeks   Status Revised     PT SHORT TERM GOAL #2   Title Patient will demonstrate ability to reach 5 inches anteriorly and reach within 10 inches from the floor unilateral UE support on RW with supervision to indicate a decrease in his risk of falling.  Target 08/23/2016     Time 4   Period Weeks   Status On-going     PT SHORT TERM GOAL #3   Title TUG with rollator walker <40 seconds with supervision. Target 08/23/2016     Time 4   Period Weeks   Status New     PT SHORT TERM  GOAL #4   Title Patient will demonstrate ability to ambulate 150 feet on indoor surfaces with rollator style walker and supervision to indicate a decrease in his risk of falling.   Target 08/23/2016     Time 4   Period Weeks   Status New           PT Long Term Goals - 08/01/16 1928      PT LONG TERM GOAL #1   Title Patient will verbalize understanding and return demonstration of ongoing HEP program. (TARGET DATE: 09/23/2016)    Time 2   Period Months   Status On-going     PT  LONG TERM GOAL #2   Title Patient will ambulate 200 feet on firm, paved outdoor surfaces with family support to indicate a decrease risk of falling when returning to community ambulation with family support.   (TARGET DATE: 09/23/2016)    Time 2   Period Months   Status Revised     PT LONG TERM GOAL #3   Title Patient ambulates 5' with standard RW and prostheses around furniture modified independent.   (TARGET DATE: 09/23/2016)    Time 2   Period Months   Status New     PT LONG TERM GOAL #4   Title Patient will be able to ambulate over ramp/curb with rollator style walker with family assistance to indicate a decrease in his risk of falling when returning to community ambulation with family.   (TARGET DATE: 09/23/2016)    Time 2   Period Months   Status On-going     PT LONG TERM GOAL #5   Title Patient will demonstrate ability to reach 8 inches anteriorly and reach within 8 inches from the floor unilateral UE support on RW with supervision to indicate a decrease in his risk of falling.   (TARGET DATE: 09/23/2016)    Time 2   Period Months   Status On-going               Plan - 08/01/16 1931    Clinical Impression Statement Patient's rehabilitation was limited with cardiac issues during this certification period. He has potential to meet LTGs that represent his ability to access community with his famiy with further skilled PT.    Rehab Potential Good   Clinical Impairments Affecting Rehab  Potential bilateral transtibial amputations, anemia, asthma, contracture of L knee, decubitus ulcer of sacral region, DM with PVD, ESRD, glaucoma, heart murmur, HTN, hypothyroidism, multiple myeloma, PAD   PT Frequency 2x / week   PT Duration 8 weeks   PT Treatment/Interventions ADLs/Self Care Home Management;Neuromuscular re-education;Balance training;Therapeutic exercise;Therapeutic activities;Functional mobility training;Stair training;Gait training;DME Instruction;Patient/family education;Prosthetic Training;Manual techniques;Scar mobilization;Passive range of motion   PT Next Visit Plan Continue to monitor patient's HR response throughout session, review and confirm understanding/performance of HEP, progress prosthetic gait with rollator with pivot turning and sit<>stand with rollator, decreasing stabilization of rollator against wall as indicated   Consulted and Agree with Plan of Care Patient      Patient will benefit from skilled therapeutic intervention in order to improve the following deficits and impairments:  Abnormal gait, Decreased activity tolerance, Decreased balance, Decreased coordination, Decreased range of motion, Decreased safety awareness, Decreased knowledge of use of DME, Decreased knowledge of precautions, Decreased endurance, Decreased strength, Difficulty walking, Prosthetic Dependency, Postural dysfunction  Visit Diagnosis: Muscle weakness (generalized)  Unsteadiness on feet  Other abnormalities of gait and mobility  Contracture of muscle, multiple sites     Problem List Patient Active Problem List   Diagnosis Date Noted  . Dyslipidemia 11/21/2015  . Diabetic retinopathy ([place])- left eye 10/02/2015  . Complications, amputation stump late ([place]) 05/20/2014  . Weakness generalized 04/07/2014  . Sepsis ([place]) 04/07/2014  . Decubitus ulcer, stage II 04/07/2014  . Decubitus ulcer of ankle   . Fatigue   . Wound infection 04/06/2014  . Decubitus ulcer of sacral  region, stage 2 03/07/2014  . Glaucoma 03/07/2014  . Gout 03/07/2014  . Below knee amputation status ([place]) 02/18/2014  . Osteomyelitis ([place]) 01/04/2014  . Phantom limb ([place]) 12/14/2013  . Type 2 diabetes mellitus with diabetic foot infection ([name])   .  Diabetes mellitus with peripheral vascular disease ([place])   . Asthma 05/17/2013  . History of MRSA infection 04/22/2013  . Peripheral vascular disease ([place]) 11/19/2012  . End-stage renal disease on hemodialysis ([place]) 05/05/2012  . Kahler disease ([place]) 03/30/2012  . Anemia in chronic kidney disease 03/19/2012  . Hypothyroidism following radioiodine therapy 03/19/2012  . Anemia, iron deficiency 03/19/2012  . Hypertension 03/18/2012  . Chronic kidney disease (CKD), stage V ([place]) 03/18/2012  . Cardiac conduction disorder 03/18/2012  . MGUS (monoclonal gammopathy of unknown significance) 02/28/2011  . Monoclonal paraproteinemia 02/28/2011    Jamey Reas PT, DPT 08/01/2016, 7:33 PM  [facility] [address] [place], [place], [address] Phone: [phone]   Fax:  [phone]  Name: Denim Kalmbach MRN: [id number] Date of Birth: Jan 01, 1946

## 2016-08-01 NOTE — Addendum Note (Signed)
Addended by: Isaias Cowman on: 08/01/2016 07:36 PM   Modules accepted: Orders

## 2016-08-02 DIAGNOSIS — N186 End stage renal disease: Secondary | ICD-10-CM | POA: Diagnosis not present

## 2016-08-02 DIAGNOSIS — E1029 Type 1 diabetes mellitus with other diabetic kidney complication: Secondary | ICD-10-CM | POA: Diagnosis not present

## 2016-08-02 DIAGNOSIS — N2581 Secondary hyperparathyroidism of renal origin: Secondary | ICD-10-CM | POA: Diagnosis not present

## 2016-08-02 DIAGNOSIS — D689 Coagulation defect, unspecified: Secondary | ICD-10-CM | POA: Diagnosis not present

## 2016-08-02 DIAGNOSIS — D631 Anemia in chronic kidney disease: Secondary | ICD-10-CM | POA: Diagnosis not present

## 2016-08-02 DIAGNOSIS — T82598A Other mechanical complication of other cardiac and vascular devices and implants, initial encounter: Secondary | ICD-10-CM | POA: Diagnosis not present

## 2016-08-05 ENCOUNTER — Ambulatory Visit (INDEPENDENT_AMBULATORY_CARE_PROVIDER_SITE_OTHER): Payer: Medicare Other | Admitting: Orthopedic Surgery

## 2016-08-05 ENCOUNTER — Encounter (INDEPENDENT_AMBULATORY_CARE_PROVIDER_SITE_OTHER): Payer: Self-pay | Admitting: Orthopedic Surgery

## 2016-08-05 VITALS — Ht 70.0 in | Wt 163.0 lb

## 2016-08-05 DIAGNOSIS — Z89512 Acquired absence of left leg below knee: Secondary | ICD-10-CM | POA: Diagnosis not present

## 2016-08-05 DIAGNOSIS — Z89511 Acquired absence of right leg below knee: Secondary | ICD-10-CM

## 2016-08-05 DIAGNOSIS — N2581 Secondary hyperparathyroidism of renal origin: Secondary | ICD-10-CM | POA: Diagnosis not present

## 2016-08-05 DIAGNOSIS — E1029 Type 1 diabetes mellitus with other diabetic kidney complication: Secondary | ICD-10-CM | POA: Diagnosis not present

## 2016-08-05 DIAGNOSIS — D631 Anemia in chronic kidney disease: Secondary | ICD-10-CM | POA: Diagnosis not present

## 2016-08-05 DIAGNOSIS — N186 End stage renal disease: Secondary | ICD-10-CM | POA: Diagnosis not present

## 2016-08-05 DIAGNOSIS — T82598A Other mechanical complication of other cardiac and vascular devices and implants, initial encounter: Secondary | ICD-10-CM | POA: Diagnosis not present

## 2016-08-05 DIAGNOSIS — D689 Coagulation defect, unspecified: Secondary | ICD-10-CM | POA: Diagnosis not present

## 2016-08-05 NOTE — Progress Notes (Signed)
Office Visit Note   Patient: Jack [name]           Date of Birth: [birth year]           MRN: [id number] Visit Date: 08/05/2016              Requested by: Martinique, Betty G, [place], [place] [address] PCP: Martinique, Betty G, MD  Chief Complaint  Patient presents with  . Right Leg - Follow-up    Bilateral socket revision for prosthetics with [facility]   . Left Leg - Follow-up      HPI: Patient is a 71 year old gentleman status post bilateral transtibial amputation status post hamstring tendon release on the left secondary to contractures. He has been fit with new prosthesis.  Assessment & Plan: Visit Diagnoses:  1. S/P bilateral below knee amputation ([place])     Plan: Patient is to follow-up with [facility] for revision of the socket with continued use of the K2 level of foot and ankle. Follow-up as needed.  Follow-Up Instructions: Return if symptoms worsen or fail to improve.   Ortho Exam  Patient is alert, oriented, no adenopathy, well-dressed, normal affect, normal respiratory effort. Examination patient is currently ambulating in a wheelchair. Patient complains of difficulty with balance and strength. Recommended patient going to the [facility] for strength training 3 times a week. Examination patient lacks about 5 of full extension of the left knee right knee has full extension the residual limbs are well consolidated no ulcers no callus no signs of infection or wounds.  Imaging: No results found.  Labs: Lab Results  Component Value Date   HGBA1C 7.2 (H) 07/24/2016   HGBA1C 7.0 (H) 03/19/2016   HGBA1C 7.0 (H) 11/21/2015   ESRSEDRATE 69 (H) 01/04/2014   ESRSEDRATE 28 (H) 09/21/2013   ESRSEDRATE 20 (H) 02/24/2013   CRP 8.1 (H) 01/04/2014   CRP 4.0 (H) 09/21/2013   LABURIC 9.4 (H) 01/28/2011   REPTSTATUS 04/13/2014 FINAL 04/07/2014   GRAMSTAIN  01/04/2014    NO WBC SEEN NO SQUAMOUS EPITHELIAL CELLS SEEN MODERATE GRAM POSITIVE  COCCI IN PAIRS IN CLUSTERS Performed at [facility]  04/07/2014    NO GROWTH 5 DAYS Performed at [facility] 01/04/2014    Orders:  No orders of the defined types were placed in this encounter.  No orders of the defined types were placed in this encounter.    Procedures: No procedures performed  Clinical Data: No additional findings.  ROS:  All other systems negative, except as noted in the HPI. Review of Systems  Objective: Vital Signs: Ht '5\' 10"'  (1.778 m)   Wt 163 lb (73.9 kg)   BMI 23.39 kg/m   Specialty Comments:  No specialty comments available.  PMFS History: Patient Active Problem List   Diagnosis Date Noted  . Dyslipidemia 11/21/2015  . Diabetic retinopathy ([place])- left eye 10/02/2015  . Complications, amputation stump late ([place]) 05/20/2014  . Weakness generalized 04/07/2014  . Sepsis ([place]) 04/07/2014  . Decubitus ulcer, stage II 04/07/2014  . Decubitus ulcer of ankle   . Fatigue   . Wound infection 04/06/2014  . Decubitus ulcer of sacral region, stage 2 03/07/2014  . Glaucoma 03/07/2014  . Gout 03/07/2014  . Below knee amputation status ([place]) 02/18/2014  . Osteomyelitis ([place]) 01/04/2014  . Phantom limb ([place]) 12/14/2013  . Type 2 diabetes mellitus with diabetic foot infection ([place])   .  Diabetes mellitus with peripheral vascular disease ([place])   . Asthma 05/17/2013  . S/P bilateral below knee amputation ([place]) 05/17/2013  . History of MRSA infection 04/22/2013  . Peripheral vascular disease ([facility]) 11/19/2012  . End-stage renal disease on hemodialysis ([place]) 05/05/2012  . Kahler disease ([place]) 03/30/2012  . Anemia in chronic kidney disease 03/19/2012  . Hypothyroidism following radioiodine therapy 03/19/2012  . Anemia, iron deficiency 03/19/2012  . Hypertension 03/18/2012  . Chronic kidney disease (CKD), stage V ([place]) 03/18/2012  . Cardiac conduction disorder 03/18/2012  . MGUS (monoclonal  gammopathy of unknown significance) 02/28/2011  . Monoclonal paraproteinemia 02/28/2011   Past Medical History:  Diagnosis Date  . Anemia   . Asthma   . Contracture of joint    left knee  . Decubitus ulcer of sacral region, stage 2 03/07/2014  . Diabetes mellitus with peripheral vascular disease ([place])   . End-stage renal disease on hemodialysis [facility])    Started HD March 2014.  Cause of ESRD was DM.  Gets HD at [facility] on [place] on MWF schedule.   Marland Kitchen ESRD on hemodialysis ([place])   . Gangrene ([place])    right BKA  . Gangrene of foot ([place])   . Glaucoma   . Glaucoma 03/07/2014  . Heart murmur   . History of MRSA infection 04/22/2013   Bacteremia assoc w L foot wound infection Mar 2015   . Hyperparathyroidism, secondary renal ([place])   . Hypertension   . Hypothyroidism   . MRSA bacteremia   . Multiple myeloma, without mention of having achieved remission 03/30/2012   Cytogenetic neg on 03/23/2012.  Marland Kitchen Peripheral arterial disease ([place])   . Peripheral vascular disease, unspecified ([place]) 11/19/2012   In the past had R foot toe amps then R TMA. In 2015 had left foot toe amp > then TMA >then L BKA on 05/14/13   . Pneumonia    2012  . Thyroid disease    hyperparathyroidism    Family History  Problem Relation Age of Onset  . Diabetes Mother   . Cancer Mother        bone   . Kidney disease Father     Past Surgical History:  Procedure Laterality Date  . ABDOMINAL AORTAGRAM Bilateral 11/06/2012   Procedure: ABDOMINAL AORTAGRAM;  Surgeon: Elam Dutch, MD;  Location: [facility] CATH LAB;  Service: Cardiovascular;  Laterality: Bilateral;  . AMPUTATION Right 11/10/2012   Procedure: AMPUTATION FIRST and SECOND TOES Right Foot;  Surgeon: Elam Dutch, MD;  Location: [place];  Service: Vascular;  Laterality: Right;  . AMPUTATION Left 04/07/2013   Procedure: AMPUTATION DIGIT- LEFT 1ST TOE;  Surgeon: Mal Misty, MD;  Location: [place];  Service: Vascular;  Laterality: Left;  . AMPUTATION Left  04/26/2013   Procedure: Left Foot Transmetatarsal Amputation;  Surgeon: Newt Minion, MD;  Location: [place];  Service: Orthopedics;  Laterality: Left;  . AMPUTATION Left 05/14/2013   Procedure: AMPUTATION BELOW KNEE;  Surgeon: Newt Minion, MD;  Location: [place];  Service: Orthopedics;  Laterality: Left;  Left Below Knee Amputation  . AMPUTATION Left 07/23/2013   Procedure: AMPUTATION BELOW KNEE;  Surgeon: Newt Minion, MD;  Location: [place];  Service: Orthopedics;  Laterality: Left;  Left Below Knee Amputation Revision  . AMPUTATION Right 02/18/2014   Procedure: AMPUTATION BELOW KNEE;  Surgeon: Newt Minion, MD;  Location: [place];  Service: Orthopedics;  Laterality: Right;  . AV FISTULA PLACEMENT Left 03/25/2012   Procedure:  ARTERIOVENOUS (AV) FISTULA CREATION;  Surgeon: Mal Misty, MD;  Location: [place];  Service: Vascular;  Laterality: Left;  . BELOW KNEE LEG AMPUTATION Left 05/14/2013   DR Previn Jian  . BELOW KNEE LEG AMPUTATION Right 02/18/2014   dr Anner Baity  . CARDIAC CATHETERIZATION     approx 30 years ago  . CERVICAL DISC SURGERY    . EYE SURGERY Bilateral    CATARACTS  . I&D EXTREMITY Left 04/22/2013   Procedure: IRRIGATION AND DEBRIDEMENT LEFT FIRST TOE AMPUTATION WOUND ;  Surgeon: Mal Misty, MD;  Location: [place];  Service: Vascular;  Laterality: Left;  . I&D EXTREMITY Right 01/05/2014   Procedure: IRRIGATION AND DEBRIDEMENT Right Heel Ulcer;  Surgeon: Mcarthur Rossetti, MD;  Location: [facility];  Service: Orthopedics;  Laterality: Right;  Surgeon Available after 5PM  . INSERTION OF DIALYSIS CATHETER Right 03/19/2012   Procedure: INSERTION OF DIALYSIS CATHETER;  Surgeon: Mal Misty, MD;  Location: [place];  Service: Vascular;  Laterality: Right;  Right Internal Jugular  . LIGATION OF COMPETING BRANCHES OF ARTERIOVENOUS FISTULA Left 05/08/2012   Procedure: LIGATION OF COMPETING BRANCHES OF ARTERIOVENOUS FISTULA;  Surgeon: Mal Misty, MD;  Location: [place];  Service: Vascular;  Laterality:  Left;  Ultrasound guided  . REPAIR QUADRICEPS/HAMSTRING MUSCLES Left 05/20/2014   Procedure: Left Hamstring Release;  Surgeon: Newt Minion, MD;  Location: [place];  Service: Orthopedics;  Laterality: Left;  . REVISON OF ARTERIOVENOUS FISTULA Left 01/30/2016   Procedure: CEPHALIC VEIN TURNDOWN TO LEFT UPPER ARM;  Surgeon: Elam Dutch, MD;  Location: [place];  Service: Vascular;  Laterality: Left;  . STUMP REVISION Right 05/20/2014   Procedure: Revision Right Below Knee Amputation;  Surgeon: Newt Minion, MD;  Location: [facility];  Service: Orthopedics;  Laterality: Right;  . TEE WITHOUT CARDIOVERSION N/A 04/20/2013   Procedure: TRANSESOPHAGEAL ECHOCARDIOGRAM (TEE);  Surgeon: Josue Hector, MD;  Location: [facility] ENDOSCOPY;  Service: Cardiovascular;  Laterality: N/A;  . THYROIDECTOMY    . TOE AMPUTATION     D/C 04-30-13  . TRANSMETATARSAL AMPUTATION Left 12/16/2012   Procedure: TRANSMETATARSAL AMPUTATION AND VAC PLACEMENT;  Surgeon: Elam Dutch, MD;  Location: [facility] OR;  Service: Vascular;  Laterality: Left;   Social History   Occupational History  . Not on file.   Social History Main Topics  . Smoking status: Former Smoker    Types: Cigarettes    Quit date: 03/19/1982  . Smokeless tobacco: Never Used  . Alcohol use No     Comment: formerly  . Drug use: No  . Sexual activity: No

## 2016-08-06 ENCOUNTER — Ambulatory Visit: Payer: Medicare Other | Admitting: Physical Therapy

## 2016-08-06 ENCOUNTER — Encounter: Payer: Self-pay | Admitting: Vascular Surgery

## 2016-08-06 VITALS — BP 133/89 | HR 45

## 2016-08-06 DIAGNOSIS — M6249 Contracture of muscle, multiple sites: Secondary | ICD-10-CM

## 2016-08-06 DIAGNOSIS — R2689 Other abnormalities of gait and mobility: Secondary | ICD-10-CM | POA: Diagnosis not present

## 2016-08-06 DIAGNOSIS — R2681 Unsteadiness on feet: Secondary | ICD-10-CM | POA: Diagnosis not present

## 2016-08-06 DIAGNOSIS — M6281 Muscle weakness (generalized): Secondary | ICD-10-CM

## 2016-08-06 NOTE — Therapy (Signed)
[facility] [address] [place], [place], [address] Phone: [phone]   Fax:  [phone]  Physical Therapy Treatment  Patient Details  Name: Jack [name] MRN: [id number] Date of Birth: 01/31/1945 Referring Provider: Meridee Score MD   Encounter Date: 08/06/2016      PT End of Session - 08/06/16 1225    Visit Number 9   Number of Visits 24   Date for PT Re-Evaluation 09/20/16   Authorization Type Medicare & G-codes every 10th visit    PT Start Time 1101   PT Stop Time 1144   PT Time Calculation (min) 43 min   Equipment Utilized During Treatment Gait belt   Activity Tolerance Patient tolerated treatment well   Behavior During Therapy [facility] for tasks assessed/performed      Past Medical History:  Diagnosis Date  . Anemia   . Asthma   . Contracture of joint    left knee  . Decubitus ulcer of sacral region, stage 2 03/07/2014  . Diabetes mellitus with peripheral vascular disease ([place])   . End-stage renal disease on hemodialysis [facility])    Started HD March 2014.  Cause of ESRD was DM.  Gets HD at [facility] on [address] on MWF schedule.   Marland Kitchen ESRD on hemodialysis ([place])   . Gangrene ([place])    right BKA  . Gangrene of foot ([place])   . Glaucoma   . Glaucoma 03/07/2014  . Heart murmur   . History of MRSA infection 04/22/2013   Bacteremia assoc w L foot wound infection Mar 2015   . Hyperparathyroidism, secondary renal ([place])   . Hypertension   . Hypothyroidism   . MRSA bacteremia   . Multiple myeloma, without mention of having achieved remission 03/30/2012   Cytogenetic neg on 03/23/2012.  Marland Kitchen Peripheral arterial disease ([place])   . Peripheral vascular disease, unspecified ([place]) 11/19/2012   In the past had R foot toe amps then R TMA. In 2015 had left foot toe amp > then TMA >then L BKA on 05/14/13   . Pneumonia    2012  . Thyroid disease    hyperparathyroidism    Past Surgical History:  Procedure Laterality Date   . ABDOMINAL AORTAGRAM Bilateral 11/06/2012   Procedure: ABDOMINAL AORTAGRAM;  Surgeon: Elam Dutch, MD;  Location: [facility] CATH LAB;  Service: Cardiovascular;  Laterality: Bilateral;  . AMPUTATION Right 11/10/2012   Procedure: AMPUTATION FIRST and SECOND TOES Right Foot;  Surgeon: Elam Dutch, MD;  Location: [place];  Service: Vascular;  Laterality: Right;  . AMPUTATION Left 04/07/2013   Procedure: AMPUTATION DIGIT- LEFT 1ST TOE;  Surgeon: Mal Misty, MD;  Location: [place];  Service: Vascular;  Laterality: Left;  . AMPUTATION Left 04/26/2013   Procedure: Left Foot Transmetatarsal Amputation;  Surgeon: Newt Minion, MD;  Location: [place];  Service: Orthopedics;  Laterality: Left;  . AMPUTATION Left 05/14/2013   Procedure: AMPUTATION BELOW KNEE;  Surgeon: Newt Minion, MD;  Location: [place];  Service: Orthopedics;  Laterality: Left;  Left Below Knee Amputation  . AMPUTATION Left 07/23/2013   Procedure: AMPUTATION BELOW KNEE;  Surgeon: Newt Minion, MD;  Location: [place];  Service: Orthopedics;  Laterality: Left;  Left Below Knee Amputation Revision  . AMPUTATION Right 02/18/2014   Procedure: AMPUTATION BELOW KNEE;  Surgeon: Newt Minion, MD;  Location: [place];  Service: Orthopedics;  Laterality: Right;  . AV FISTULA PLACEMENT Left 03/25/2012   Procedure: ARTERIOVENOUS (AV)  FISTULA CREATION;  Surgeon: Mal Misty, MD;  Location: [place];  Service: Vascular;  Laterality: Left;  . BELOW KNEE LEG AMPUTATION Left 05/14/2013   DR [name]  . BELOW KNEE LEG AMPUTATION Right 02/18/2014   dr duda  . CARDIAC CATHETERIZATION     approx 30 years ago  . CERVICAL DISC SURGERY    . EYE SURGERY Bilateral    CATARACTS  . I&D EXTREMITY Left 04/22/2013   Procedure: IRRIGATION AND DEBRIDEMENT LEFT FIRST TOE AMPUTATION WOUND ;  Surgeon: Mal Misty, MD;  Location: [place];  Service: Vascular;  Laterality: Left;  . I&D EXTREMITY Right 01/05/2014   Procedure: IRRIGATION AND DEBRIDEMENT Right Heel Ulcer;  Surgeon:  Mcarthur Rossetti, MD;  Location: [place];  Service: Orthopedics;  Laterality: Right;  Surgeon Available after 5PM  . INSERTION OF DIALYSIS CATHETER Right 03/19/2012   Procedure: INSERTION OF DIALYSIS CATHETER;  Surgeon: Mal Misty, MD;  Location: [place];  Service: Vascular;  Laterality: Right;  Right Internal Jugular  . LIGATION OF COMPETING BRANCHES OF ARTERIOVENOUS FISTULA Left 05/08/2012   Procedure: LIGATION OF COMPETING BRANCHES OF ARTERIOVENOUS FISTULA;  Surgeon: Mal Misty, MD;  Location: [place];  Service: Vascular;  Laterality: Left;  Ultrasound guided  . REPAIR QUADRICEPS/HAMSTRING MUSCLES Left 05/20/2014   Procedure: Left Hamstring Release;  Surgeon: Newt Minion, MD;  Location: [place];  Service: Orthopedics;  Laterality: Left;  . REVISON OF ARTERIOVENOUS FISTULA Left 01/30/2016   Procedure: CEPHALIC VEIN TURNDOWN TO LEFT UPPER ARM;  Surgeon: Elam Dutch, MD;  Location: [place];  Service: Vascular;  Laterality: Left;  . STUMP REVISION Right 05/20/2014   Procedure: Revision Right Below Knee Amputation;  Surgeon: Newt Minion, MD;  Location: [place];  Service: Orthopedics;  Laterality: Right;  . TEE WITHOUT CARDIOVERSION N/A 04/20/2013   Procedure: TRANSESOPHAGEAL ECHOCARDIOGRAM (TEE);  Surgeon: Josue Hector, MD;  Location: [facility] ENDOSCOPY;  Service: Cardiovascular;  Laterality: N/A;  . THYROIDECTOMY    . TOE AMPUTATION     D/C 04-30-13  . TRANSMETATARSAL AMPUTATION Left 12/16/2012   Procedure: TRANSMETATARSAL AMPUTATION AND VAC PLACEMENT;  Surgeon: Elam Dutch, MD;  Location: [facility] OR;  Service: Vascular;  Laterality: Left;    Vitals:   08/06/16 1058 08/06/16 1223 08/06/16 1225  BP: 133/89    Pulse: (!) 48 (!) 48 (!) 45  SpO2: 95% 97%         Subjective Assessment - 08/06/16 1058    Subjective No falls reported. Pt. reports the pain in his right hand is near unbearable. Discussed pt. contacting MD to inform him of the pain. Pt. to see wound care on Thursday for wound care of  the tip of his finger on his right hand.   Patient is accompained by: Family member   Pertinent History bilateral transtibial amputations, anemia, asthma, contracture of L knee, decubitus ulcer of sacral region, DM with PVD, ESRD, glaucoma, heart murmur, HTN, hypothyroidism, multiple myeloma, PAD   Limitations Lifting;Standing;Walking;House hold activities   Patient Stated Goals walk without a person assisting him (walk with device only)    Currently in Pain? Yes   Pain Score 10-Worst pain ever   Pain Location Hand   Pain Orientation Right   Pain Descriptors / Indicators Aching   Pain Type Acute pain   Pain Onset In the past 7 days   Pain Frequency Constant           [facility] Adult PT Treatment/Exercise - 08/06/16  0001      Transfers   Transfers Sit to Stand;Stand to Sit;Stand Pivot Transfers   Sit to Stand 4: Min guard   Sit to Stand Details Verbal cues for precautions/safety;Verbal cues for technique;Verbal cues for safe use of DME/AE   Stand to Sit 4: Min guard   Stand to Sit Details (indicate cue type and reason) Verbal cues for safe use of DME/AE;Verbal cues for technique;Visual cues/gestures for precautions/safety   Stand Pivot Transfers 4: Min guard   Stand Pivot Transfer Details (indicate cue type and reason) VC's to move feet, adjust positioning of hands as pivoting after standing from rollator, and for posture   Comments Pt. ambulated 25 feet, applied brakes, performed stand pivot transfer<> rollator seat x1 with SPTA bracing front wheels, min guard, see cues above. Other seated rest breaks were with chairs around gym that required 90* turns with cues on sequencing with rollator. Mostly min guard assist regressing to min assist as he fatigued.       Ambulation/Gait   Ambulation/Gait Yes   Ambulation/Gait Assistance 4: Min guard   Ambulation/Gait Assistance Details VC's for posture and step length, step length is improved, better control avoiding letting the rollator get too  far in front of him as he walks.   Ambulation Distance (Feet) 125 Feet total. Seated rest breaks needed ~ every 25 feet.   Assistive device Rollator;Prosthesis   Gait Pattern Step-through pattern;Left flexed knee in stance;Right flexed knee in stance;Wide base of support;Poor foot clearance - left;Poor foot clearance - right;Trunk flexed   Ambulation Surface Level;Indoor     Prosthetics   Prosthetic Care Comments  Saw Dr. Sharol Given yesterday for his appt.   Current prosthetic wear tolerance (days/week)  daily    Current prosthetic wear tolerance (#hours/day)  all awake hours every day   Residual limb condition  Denies any skin integrity issues.             PT Education - 08/06/16 1227    Education provided Yes   Education Details Reviewed and discussed pt. HEP, pt confirmed understanding of exercises, stated he had no questions about them.  He mentioned he is not performing bridges, but is performing the seated hamstring stretch and the lower trunk rotation stretch.   Person(s) Educated Patient;Child(ren)   Methods Explanation;Handout   Comprehension Verbalized understanding          PT Short Term Goals - 08/01/16 1924      PT SHORT TERM GOAL #1   Title Patient will verbalize understanding and return demonstration of updated HEP. Target 08/23/2016     Time 4   Period Weeks   Status Revised     PT SHORT TERM GOAL #2   Title Patient will demonstrate ability to reach 5 inches anteriorly and reach within 10 inches from the floor unilateral UE support on RW with supervision to indicate a decrease in his risk of falling.  Target 08/23/2016     Time 4   Period Weeks   Status On-going     PT SHORT TERM GOAL #3   Title TUG with rollator walker <40 seconds with supervision. Target 08/23/2016     Time 4   Period Weeks   Status New     PT SHORT TERM GOAL #4   Title Patient will demonstrate ability to ambulate 150 feet on indoor surfaces with rollator style walker and supervision to  indicate a decrease in his risk of falling.   Target 08/23/2016  Time 4   Period Weeks   Status New           PT Long Term Goals - 08/01/16 1928      PT LONG TERM GOAL #1   Title Patient will verbalize understanding and return demonstration of ongoing HEP program. (TARGET DATE: 09/23/2016)    Time 2   Period Months   Status On-going     PT LONG TERM GOAL #2   Title Patient will ambulate 200 feet on firm, paved outdoor surfaces with family support to indicate a decrease risk of falling when returning to community ambulation with family support.   (TARGET DATE: 09/23/2016)    Time 2   Period Months   Status Revised     PT LONG TERM GOAL #3   Title Patient ambulates 77' with standard RW and prostheses around furniture modified independent.   (TARGET DATE: 09/23/2016)    Time 2   Period Months   Status New     PT LONG TERM GOAL #4   Title Patient will be able to ambulate over ramp/curb with rollator style walker with family assistance to indicate a decrease in his risk of falling when returning to community ambulation with family.   (TARGET DATE: 09/23/2016)    Time 2   Period Months   Status On-going     PT LONG TERM GOAL #5   Title Patient will demonstrate ability to reach 8 inches anteriorly and reach within 8 inches from the floor unilateral UE support on RW with supervision to indicate a decrease in his risk of falling.   (TARGET DATE: 09/23/2016)    Time 2   Period Months   Status On-going               Plan - 08/06/16 1235    Clinical Impression Statement Today's skilled session focused on endurance with prosthetic gait and performing a stand pivot transfer to rollator seat.  Pt. performed stand pivot transfer well with min guard, needs stabilization of rollator to perform safely. Pt. required a rest break after ambulating approximately every 25 feet. Pt. will benefit from continued PT to progress towards unmet goals.    Rehab Potential Good   Clinical  Impairments Affecting Rehab Potential bilateral transtibial amputations, anemia, asthma, contracture of L knee, decubitus ulcer of sacral region, DM with PVD, ESRD, glaucoma, heart murmur, HTN, hypothyroidism, multiple myeloma, PAD   PT Frequency 2x / week   PT Duration 8 weeks   PT Treatment/Interventions ADLs/Self Care Home Management;Neuromuscular re-education;Balance training;Therapeutic exercise;Therapeutic activities;Functional mobility training;Stair training;Gait training;DME Instruction;Patient/family education;Prosthetic Training;Manual techniques;Scar mobilization;Passive range of motion   PT Next Visit Plan Check G-codes; Continue to work on endurance with prosthetic gait and independence with pivot turning and sit<>stand with rollator   Consulted and Agree with Plan of Care Patient;Family member/caregiver      Patient will benefit from skilled therapeutic intervention in order to improve the following deficits and impairments:  Abnormal gait, Decreased activity tolerance, Decreased balance, Decreased coordination, Decreased range of motion, Decreased safety awareness, Decreased knowledge of use of DME, Decreased knowledge of precautions, Decreased endurance, Decreased strength, Difficulty walking, Prosthetic Dependency, Postural dysfunction  Visit Diagnosis: Muscle weakness (generalized)  Contracture of muscle, multiple sites     Problem List Patient Active Problem List   Diagnosis Date Noted  . Dyslipidemia 11/21/2015  . Diabetic retinopathy ([place])- left eye 10/02/2015  . Complications, amputation stump late ([place]) 05/20/2014  . Weakness generalized 04/07/2014  . Sepsis ([place]) 04/07/2014  .  Decubitus ulcer, stage II 04/07/2014  . Decubitus ulcer of ankle   . Fatigue   . Wound infection 04/06/2014  . Decubitus ulcer of sacral region, stage 2 03/07/2014  . Glaucoma 03/07/2014  . Gout 03/07/2014  . Below knee amputation status ([place]) 02/18/2014  . Osteomyelitis ([place])  01/04/2014  . Phantom limb ([place]) 12/14/2013  . Type 2 diabetes mellitus with diabetic foot infection ([place])   . Diabetes mellitus with peripheral vascular disease ([place])   . Asthma 05/17/2013  . S/P bilateral below knee amputation ([place]) 05/17/2013  . History of MRSA infection 04/22/2013  . Peripheral vascular disease ([place]) 11/19/2012  . End-stage renal disease on hemodialysis ([place]) 05/05/2012  . Kahler disease ([place]) 03/30/2012  . Anemia in chronic kidney disease 03/19/2012  . Hypothyroidism following radioiodine therapy 03/19/2012  . Anemia, iron deficiency 03/19/2012  . Hypertension 03/18/2012  . Chronic kidney disease (CKD), stage V ([place]) 03/18/2012  . Cardiac conduction disorder 03/18/2012  . MGUS (monoclonal gammopathy of unknown significance) 02/28/2011  . Monoclonal paraproteinemia 02/28/2011    Rulon Eisenmenger, SPTA 08/06/2016, 12:39 PM  [facility] [address] [place], [place], [address] Phone: [phone]   Fax:  [phone]  Name: Jack [name] MRN: [id number] Date of Birth: 18-Mar-1945   This note has been reviewed and edited by supervising CI.  Willow Ora, PTA, [facility] [address] [place], [place] [address] [phone] 08/06/16, 1:47 PM

## 2016-08-07 DIAGNOSIS — N186 End stage renal disease: Secondary | ICD-10-CM | POA: Diagnosis not present

## 2016-08-07 DIAGNOSIS — E1029 Type 1 diabetes mellitus with other diabetic kidney complication: Secondary | ICD-10-CM | POA: Diagnosis not present

## 2016-08-07 DIAGNOSIS — T82598A Other mechanical complication of other cardiac and vascular devices and implants, initial encounter: Secondary | ICD-10-CM | POA: Diagnosis not present

## 2016-08-07 DIAGNOSIS — D689 Coagulation defect, unspecified: Secondary | ICD-10-CM | POA: Diagnosis not present

## 2016-08-07 DIAGNOSIS — N2581 Secondary hyperparathyroidism of renal origin: Secondary | ICD-10-CM | POA: Diagnosis not present

## 2016-08-07 DIAGNOSIS — D631 Anemia in chronic kidney disease: Secondary | ICD-10-CM | POA: Diagnosis not present

## 2016-08-08 ENCOUNTER — Encounter (HOSPITAL_COMMUNITY): Payer: Medicare Other

## 2016-08-08 ENCOUNTER — Ambulatory Visit: Payer: Medicare Other | Admitting: Rehabilitation

## 2016-08-08 ENCOUNTER — Other Ambulatory Visit (HOSPITAL_COMMUNITY): Payer: Medicare Other

## 2016-08-08 ENCOUNTER — Other Ambulatory Visit: Payer: Self-pay | Admitting: Internal Medicine

## 2016-08-08 ENCOUNTER — Encounter: Payer: Self-pay | Admitting: Rehabilitation

## 2016-08-08 ENCOUNTER — Ambulatory Visit: Payer: Medicare Other | Admitting: Vascular Surgery

## 2016-08-08 ENCOUNTER — Ambulatory Visit (HOSPITAL_COMMUNITY)
Admission: RE | Admit: 2016-08-08 | Discharge: 2016-08-08 | Disposition: A | Discharge status: Home / Self Care | Payer: Medicare Other | Source: Ambulatory Visit | Attending: Internal Medicine | Admitting: Internal Medicine

## 2016-08-08 ENCOUNTER — Encounter (HOSPITAL_BASED_OUTPATIENT_CLINIC_OR_DEPARTMENT_OTHER): Payer: Medicare Other | Attending: Internal Medicine

## 2016-08-08 DIAGNOSIS — X58XXXA Exposure to other specified factors, initial encounter: Secondary | ICD-10-CM | POA: Insufficient documentation

## 2016-08-08 DIAGNOSIS — H5462 Unqualified visual loss, left eye, normal vision right eye: Secondary | ICD-10-CM | POA: Diagnosis not present

## 2016-08-08 DIAGNOSIS — M6281 Muscle weakness (generalized): Secondary | ICD-10-CM

## 2016-08-08 DIAGNOSIS — E1122 Type 2 diabetes mellitus with diabetic chronic kidney disease: Secondary | ICD-10-CM | POA: Insufficient documentation

## 2016-08-08 DIAGNOSIS — T23021A Burn of unspecified degree of single right finger (nail) except thumb, initial encounter: Secondary | ICD-10-CM | POA: Insufficient documentation

## 2016-08-08 DIAGNOSIS — E1152 Type 2 diabetes mellitus with diabetic peripheral angiopathy with gangrene: Secondary | ICD-10-CM | POA: Insufficient documentation

## 2016-08-08 DIAGNOSIS — R2681 Unsteadiness on feet: Secondary | ICD-10-CM | POA: Diagnosis not present

## 2016-08-08 DIAGNOSIS — I120 Hypertensive chronic kidney disease with stage 5 chronic kidney disease or end stage renal disease: Secondary | ICD-10-CM | POA: Insufficient documentation

## 2016-08-08 DIAGNOSIS — N186 End stage renal disease: Secondary | ICD-10-CM | POA: Insufficient documentation

## 2016-08-08 DIAGNOSIS — Z89511 Acquired absence of right leg below knee: Secondary | ICD-10-CM | POA: Diagnosis not present

## 2016-08-08 DIAGNOSIS — Z9221 Personal history of antineoplastic chemotherapy: Secondary | ICD-10-CM | POA: Insufficient documentation

## 2016-08-08 DIAGNOSIS — X150XXA Contact with hot stove (kitchen), initial encounter: Secondary | ICD-10-CM | POA: Insufficient documentation

## 2016-08-08 DIAGNOSIS — Z87891 Personal history of nicotine dependence: Secondary | ICD-10-CM | POA: Diagnosis not present

## 2016-08-08 DIAGNOSIS — Z89512 Acquired absence of left leg below knee: Secondary | ICD-10-CM | POA: Insufficient documentation

## 2016-08-08 DIAGNOSIS — Y93G3 Activity, cooking and baking: Secondary | ICD-10-CM | POA: Diagnosis not present

## 2016-08-08 DIAGNOSIS — I96 Gangrene, not elsewhere classified: Secondary | ICD-10-CM | POA: Diagnosis not present

## 2016-08-08 DIAGNOSIS — M869 Osteomyelitis, unspecified: Secondary | ICD-10-CM

## 2016-08-08 DIAGNOSIS — Z8614 Personal history of Methicillin resistant Staphylococcus aureus infection: Secondary | ICD-10-CM | POA: Insufficient documentation

## 2016-08-08 DIAGNOSIS — Z794 Long term (current) use of insulin: Secondary | ICD-10-CM | POA: Insufficient documentation

## 2016-08-08 DIAGNOSIS — M6249 Contracture of muscle, multiple sites: Secondary | ICD-10-CM | POA: Diagnosis not present

## 2016-08-08 DIAGNOSIS — Z992 Dependence on renal dialysis: Secondary | ICD-10-CM | POA: Diagnosis not present

## 2016-08-08 DIAGNOSIS — S6991XA Unspecified injury of right wrist, hand and finger(s), initial encounter: Secondary | ICD-10-CM | POA: Diagnosis not present

## 2016-08-08 DIAGNOSIS — R2689 Other abnormalities of gait and mobility: Secondary | ICD-10-CM | POA: Diagnosis not present

## 2016-08-08 NOTE — Therapy (Signed)
[facility] [address] [place], [place], [address] Phone: [phone]   Fax:  [phone]  Physical Therapy Treatment  Patient Details  Name: Jack [name] MRN: [id number] Date of Birth: 07/29/1945 Referring Provider: Meridee Score MD   Encounter Date: 08/08/2016      PT End of Session - 08/08/16 1549    Visit Number 10   Number of Visits 24   Date for PT Re-Evaluation 09/20/16   Authorization Type Medicare & G-codes every 10th visit    PT Start Time 1541   PT Stop Time 1622   PT Time Calculation (min) 41 min   Equipment Utilized During Treatment Gait belt   Activity Tolerance Patient tolerated treatment well   Behavior During Therapy [facility] for tasks assessed/performed      Past Medical History:  Diagnosis Date  . Anemia   . Asthma   . Contracture of joint    left knee  . Decubitus ulcer of sacral region, stage 2 03/07/2014  . Diabetes mellitus with peripheral vascular disease ([place])   . End-stage renal disease on hemodialysis [facility])    Started HD March 2014.  Cause of ESRD was DM.  Gets HD at [facility] on [address] on MWF schedule.   Marland Kitchen ESRD on hemodialysis ([place])   . Gangrene ([place])    right BKA  . Gangrene of foot ([place])   . Glaucoma   . Glaucoma 03/07/2014  . Heart murmur   . History of MRSA infection 04/22/2013   Bacteremia assoc w L foot wound infection Mar 2015   . Hyperparathyroidism, secondary renal ([place])   . Hypertension   . Hypothyroidism   . MRSA bacteremia   . Multiple myeloma, without mention of having achieved remission 03/30/2012   Cytogenetic neg on 03/23/2012.  Marland Kitchen Peripheral arterial disease ([place])   . Peripheral vascular disease, unspecified ([place]) 11/19/2012   In the past had R foot toe amps then R TMA. In 2015 had left foot toe amp > then TMA >then L BKA on 05/14/13   . Pneumonia    2012  . Thyroid disease    hyperparathyroidism    Past Surgical History:  Procedure Laterality Date   . ABDOMINAL AORTAGRAM Bilateral 11/06/2012   Procedure: ABDOMINAL AORTAGRAM;  Surgeon: Elam Dutch, MD;  Location: [facility] CATH LAB;  Service: Cardiovascular;  Laterality: Bilateral;  . AMPUTATION Right 11/10/2012   Procedure: AMPUTATION FIRST and SECOND TOES Right Foot;  Surgeon: Elam Dutch, MD;  Location: [place];  Service: Vascular;  Laterality: Right;  . AMPUTATION Left 04/07/2013   Procedure: AMPUTATION DIGIT- LEFT 1ST TOE;  Surgeon: Mal Misty, MD;  Location: [place];  Service: Vascular;  Laterality: Left;  . AMPUTATION Left 04/26/2013   Procedure: Left Foot Transmetatarsal Amputation;  Surgeon: Newt Minion, MD;  Location: [place];  Service: Orthopedics;  Laterality: Left;  . AMPUTATION Left 05/14/2013   Procedure: AMPUTATION BELOW KNEE;  Surgeon: Newt Minion, MD;  Location: [place];  Service: Orthopedics;  Laterality: Left;  Left Below Knee Amputation  . AMPUTATION Left 07/23/2013   Procedure: AMPUTATION BELOW KNEE;  Surgeon: Newt Minion, MD;  Location: [place];  Service: Orthopedics;  Laterality: Left;  Left Below Knee Amputation Revision  . AMPUTATION Right 02/18/2014   Procedure: AMPUTATION BELOW KNEE;  Surgeon: Newt Minion, MD;  Location: [place];  Service: Orthopedics;  Laterality: Right;  . AV FISTULA PLACEMENT Left 03/25/2012   Procedure: ARTERIOVENOUS (AV)  FISTULA CREATION;  Surgeon: Mal Misty, MD;  Location: [place];  Service: Vascular;  Laterality: Left;  . BELOW KNEE LEG AMPUTATION Left 05/14/2013   DR [name]  . BELOW KNEE LEG AMPUTATION Right 02/18/2014   dr duda  . CARDIAC CATHETERIZATION     approx 30 years ago  . CERVICAL DISC SURGERY    . EYE SURGERY Bilateral    CATARACTS  . I&D EXTREMITY Left 04/22/2013   Procedure: IRRIGATION AND DEBRIDEMENT LEFT FIRST TOE AMPUTATION WOUND ;  Surgeon: Mal Misty, MD;  Location: [place];  Service: Vascular;  Laterality: Left;  . I&D EXTREMITY Right 01/05/2014   Procedure: IRRIGATION AND DEBRIDEMENT Right Heel Ulcer;  Surgeon:  Mcarthur Rossetti, MD;  Location: [place];  Service: Orthopedics;  Laterality: Right;  Surgeon Available after 5PM  . INSERTION OF DIALYSIS CATHETER Right 03/19/2012   Procedure: INSERTION OF DIALYSIS CATHETER;  Surgeon: Mal Misty, MD;  Location: [place];  Service: Vascular;  Laterality: Right;  Right Internal Jugular  . LIGATION OF COMPETING BRANCHES OF ARTERIOVENOUS FISTULA Left 05/08/2012   Procedure: LIGATION OF COMPETING BRANCHES OF ARTERIOVENOUS FISTULA;  Surgeon: Mal Misty, MD;  Location: [place];  Service: Vascular;  Laterality: Left;  Ultrasound guided  . REPAIR QUADRICEPS/HAMSTRING MUSCLES Left 05/20/2014   Procedure: Left Hamstring Release;  Surgeon: Newt Minion, MD;  Location: [place];  Service: Orthopedics;  Laterality: Left;  . REVISON OF ARTERIOVENOUS FISTULA Left 01/30/2016   Procedure: CEPHALIC VEIN TURNDOWN TO LEFT UPPER ARM;  Surgeon: Elam Dutch, MD;  Location: [place];  Service: Vascular;  Laterality: Left;  . STUMP REVISION Right 05/20/2014   Procedure: Revision Right Below Knee Amputation;  Surgeon: Newt Minion, MD;  Location: [place];  Service: Orthopedics;  Laterality: Right;  . TEE WITHOUT CARDIOVERSION N/A 04/20/2013   Procedure: TRANSESOPHAGEAL ECHOCARDIOGRAM (TEE);  Surgeon: Josue Hector, MD;  Location: [facility] ENDOSCOPY;  Service: Cardiovascular;  Laterality: N/A;  . THYROIDECTOMY    . TOE AMPUTATION     D/C 04-30-13  . TRANSMETATARSAL AMPUTATION Left 12/16/2012   Procedure: TRANSMETATARSAL AMPUTATION AND VAC PLACEMENT;  Surgeon: Elam Dutch, MD;  Location: [place];  Service: Vascular;  Laterality: Left;    There were no vitals filed for this visit.      Subjective Assessment - 08/08/16 1546    Subjective Pt reports he may loose part of digit on hand.  Had santyl placed today and re-bandaged and has to return next week to decide.   Has to do dressing changes daily until next week.    Patient is accompained by: Family member   Pertinent History bilateral  transtibial amputations, anemia, asthma, contracture of L knee, decubitus ulcer of sacral region, DM with PVD, ESRD, glaucoma, heart murmur, HTN, hypothyroidism, multiple myeloma, PAD   Limitations Lifting;Standing;Walking;House hold activities   Patient Stated Goals walk without a person assisting him (walk with device only)    Currently in Pain? Yes   Pain Score 10-Worst pain ever   Pain Location Finger (Comment which one)   Pain Orientation Right   Pain Descriptors / Indicators Burning   Pain Type Acute pain   Pain Onset In the past 7 days   Pain Frequency Constant   Aggravating Factors  touching   Pain Relieving Factors Tylenol                         [facility] Adult PT Treatment/Exercise -  08/08/16 0001      Transfers   Transfers Sit to Stand;Stand to Sit   Sit to Stand 5: Supervision   Sit to Stand Details Verbal cues for sequencing;Verbal cues for technique;Verbal cues for precautions/safety   Sit to Stand Details (indicate cue type and reason) Cues for forward weight shift and B foot placement when standing.     Stand to Sit 4: Min guard;4: Min assist   Stand to Sit Details (indicate cue type and reason) Verbal cues for safe use of DME/AE;Verbal cues for technique;Visual cues/gestures for precautions/safety;Verbal cues for precautions/safety   Stand to Sit Details Max cues at times for safety when backing up to sit and cues to lock breaks on rollator and keep rollator with him until all the way at seating surface.  cues to reach back for chair.      Ambulation/Gait   Ambulation/Gait Yes   Ambulation/Gait Assistance 4: Min guard   Ambulation/Gait Assistance Details Cues for posture, upward gaze and improved stride length.     Ambulation Distance (Feet) 45 Feet  x 2 reps and 18' x 2 to/from mat   Assistive device Rollator;Prosthesis   Gait Pattern Step-through pattern;Left flexed knee in stance;Right flexed knee in stance;Wide base of support;Poor foot clearance -  left;Poor foot clearance - right;Trunk flexed   Ambulation Surface Level;Indoor     Exercises   Exercises Other Exercises   Other Exercises  Performed passive hamstring stretch with leg outstretched on PTs leg with overpressure as able.  Cues for upright posture.  Held x 2 mins on each leg.  supine hip flex stretch with LE off EOM x 2 min on each side.  While in hip flex stretch, had pt perform quad set on opposite LE to work on improved knee extension ROM.  Tolerated well and educated to do on couch at home with assist from daughter as needed as he was unable to tolerate fully lowering LE off EOM.                  PT Education - 08/08/16 2011    Education provided Yes   Education Details Importance of stretching BLEs daily, multiple times to reduce contractures   Person(s) Educated Patient;Child(ren)   Methods Explanation   Comprehension Verbalized understanding          PT Short Term Goals - 08/01/16 1924      PT SHORT TERM GOAL #1   Title Patient will verbalize understanding and return demonstration of updated HEP. Target 08/23/2016     Time 4   Period Weeks   Status Revised     PT SHORT TERM GOAL #2   Title Patient will demonstrate ability to reach 5 inches anteriorly and reach within 10 inches from the floor unilateral UE support on RW with supervision to indicate a decrease in his risk of falling.  Target 08/23/2016     Time 4   Period Weeks   Status On-going     PT SHORT TERM GOAL #3   Title TUG with rollator walker <40 seconds with supervision. Target 08/23/2016     Time 4   Period Weeks   Status New     PT SHORT TERM GOAL #4   Title Patient will demonstrate ability to ambulate 150 feet on indoor surfaces with rollator style walker and supervision to indicate a decrease in his risk of falling.   Target 08/23/2016     Time 4   Period Weeks   Status  New           PT Long Term Goals - 08/01/16 1928      PT LONG TERM GOAL #1   Title Patient will verbalize  understanding and return demonstration of ongoing HEP program. (TARGET DATE: 09/23/2016)    Time 2   Period Months   Status On-going     PT LONG TERM GOAL #2   Title Patient will ambulate 200 feet on firm, paved outdoor surfaces with family support to indicate a decrease risk of falling when returning to community ambulation with family support.   (TARGET DATE: 09/23/2016)    Time 2   Period Months   Status Revised     PT LONG TERM GOAL #3   Title Patient ambulates 17' with standard RW and prostheses around furniture modified independent.   (TARGET DATE: 09/23/2016)    Time 2   Period Months   Status New     PT LONG TERM GOAL #4   Title Patient will be able to ambulate over ramp/curb with rollator style walker with family assistance to indicate a decrease in his risk of falling when returning to community ambulation with family.   (TARGET DATE: 09/23/2016)    Time 2   Period Months   Status On-going     PT LONG TERM GOAL #5   Title Patient will demonstrate ability to reach 8 inches anteriorly and reach within 8 inches from the floor unilateral UE support on RW with supervision to indicate a decrease in his risk of falling.   (TARGET DATE: 09/23/2016)    Time 2   Period Months   Status On-going               Plan - August 26, 2016 2010-03-14    Clinical Impression Statement Skilled session focused on improved sit<>stand, gait for improved quality and endurance, and BLE stretching to improve flexibility.  HR remained in mid to upper 50's throughout session.     Rehab Potential Good   Clinical Impairments Affecting Rehab Potential bilateral transtibial amputations, anemia, asthma, contracture of L knee, decubitus ulcer of sacral region, DM with PVD, ESRD, glaucoma, heart murmur, HTN, hypothyroidism, multiple myeloma, PAD   PT Frequency 2x / week   PT Duration 8 weeks   PT Treatment/Interventions ADLs/Self Care Home Management;Neuromuscular re-education;Balance training;Therapeutic  exercise;Therapeutic activities;Functional mobility training;Stair training;Gait training;DME Instruction;Patient/family education;Prosthetic Training;Manual techniques;Scar mobilization;Passive range of motion   PT Next Visit Plan Follow up on supine hip flex stretch with opposite quad set, Continue to work on endurance with prosthetic gait and independence with pivot turning and sit<>stand with rollator   Consulted and Agree with Plan of Care Patient;Family member/caregiver      Patient will benefit from skilled therapeutic intervention in order to improve the following deficits and impairments:  Abnormal gait, Decreased activity tolerance, Decreased balance, Decreased coordination, Decreased range of motion, Decreased safety awareness, Decreased knowledge of use of DME, Decreased knowledge of precautions, Decreased endurance, Decreased strength, Difficulty walking, Prosthetic Dependency, Postural dysfunction  Visit Diagnosis: Muscle weakness (generalized)  Contracture of muscle, multiple sites  Unsteadiness on feet  Other abnormalities of gait and mobility       G-Codes - August 26, 2016 03/14/2012    Functional Assessment Tool Used (Outpatient Only) Pt able to maintain static standing balance with rollator with single UE support x 20-30 secs at S level    Functional Limitation Changing and maintaining body position   Changing and Maintaining Body Position Current Status (P8242) At least 60  percent but less than 80 percent impaired, limited or restricted   Changing and Maintaining Body Position Goal Status (A1518) At least 40 percent but less than 60 percent impaired, limited or restricted      Physical Therapy Progress Note  Dates of Reporting Period: 05/28/16 to 08/08/16  Objective Reports of Subjective Statement: See above  Objective Measurements: See Gcode above  Goal Update: See LTGs  Plan: Continue current POC.    Reason Skilled Services are Required: Pt continues to have decreased  endurance, multiple site contractures, decreased balance and ability to ambulate functionally at home.  Pt will benefit from continued skilled PT to address the above.      Problem List Patient Active Problem List   Diagnosis Date Noted  . Dyslipidemia 11/21/2015  . Diabetic retinopathy ([place])- left eye 10/02/2015  . Complications, amputation stump late ([place]) 05/20/2014  . Weakness generalized 04/07/2014  . Sepsis ([place]) 04/07/2014  . Decubitus ulcer, stage II 04/07/2014  . Decubitus ulcer of ankle   . Fatigue   . Wound infection 04/06/2014  . Decubitus ulcer of sacral region, stage 2 03/07/2014  . Glaucoma 03/07/2014  . Gout 03/07/2014  . Below knee amputation status ([place]) 02/18/2014  . Osteomyelitis ([place]) 01/04/2014  . Phantom limb ([place]) 12/14/2013  . Type 2 diabetes mellitus with diabetic foot infection ([place])   . Diabetes mellitus with peripheral vascular disease ([place])   . Asthma 05/17/2013  . S/P bilateral below knee amputation ([place]) 05/17/2013  . History of MRSA infection 04/22/2013  . Peripheral vascular disease ([place]) 11/19/2012  . End-stage renal disease on hemodialysis ([place]) 05/05/2012  . Kahler disease ([place]) 03/30/2012  . Anemia in chronic kidney disease 03/19/2012  . Hypothyroidism following radioiodine therapy 03/19/2012  . Anemia, iron deficiency 03/19/2012  . Hypertension 03/18/2012  . Chronic kidney disease (CKD), stage V ([place]) 03/18/2012  . Cardiac conduction disorder 03/18/2012  . MGUS (monoclonal gammopathy of unknown significance) 02/28/2011  . Monoclonal paraproteinemia 02/28/2011    Cameron Sprang, PT, MPT [facility] [address] [place], [place], [address] Phone: [phone]   Fax:  [phone] 08/08/16, 8:18 PM  Name: Pinchos Topel MRN: [id number] Date of Birth: Nov 06, 1945

## 2016-08-09 DIAGNOSIS — N186 End stage renal disease: Secondary | ICD-10-CM | POA: Diagnosis not present

## 2016-08-09 DIAGNOSIS — D631 Anemia in chronic kidney disease: Secondary | ICD-10-CM | POA: Diagnosis not present

## 2016-08-09 DIAGNOSIS — N2581 Secondary hyperparathyroidism of renal origin: Secondary | ICD-10-CM | POA: Diagnosis not present

## 2016-08-09 DIAGNOSIS — D689 Coagulation defect, unspecified: Secondary | ICD-10-CM | POA: Diagnosis not present

## 2016-08-09 DIAGNOSIS — E1029 Type 1 diabetes mellitus with other diabetic kidney complication: Secondary | ICD-10-CM | POA: Diagnosis not present

## 2016-08-09 DIAGNOSIS — T82598A Other mechanical complication of other cardiac and vascular devices and implants, initial encounter: Secondary | ICD-10-CM | POA: Diagnosis not present

## 2016-08-12 DIAGNOSIS — N2581 Secondary hyperparathyroidism of renal origin: Secondary | ICD-10-CM | POA: Diagnosis not present

## 2016-08-12 DIAGNOSIS — D689 Coagulation defect, unspecified: Secondary | ICD-10-CM | POA: Diagnosis not present

## 2016-08-12 DIAGNOSIS — T82598A Other mechanical complication of other cardiac and vascular devices and implants, initial encounter: Secondary | ICD-10-CM | POA: Diagnosis not present

## 2016-08-12 DIAGNOSIS — N186 End stage renal disease: Secondary | ICD-10-CM | POA: Diagnosis not present

## 2016-08-12 DIAGNOSIS — D631 Anemia in chronic kidney disease: Secondary | ICD-10-CM | POA: Diagnosis not present

## 2016-08-12 DIAGNOSIS — E1029 Type 1 diabetes mellitus with other diabetic kidney complication: Secondary | ICD-10-CM | POA: Diagnosis not present

## 2016-08-13 ENCOUNTER — Ambulatory Visit: Payer: Medicare Other | Admitting: Physical Therapy

## 2016-08-13 DIAGNOSIS — N186 End stage renal disease: Secondary | ICD-10-CM | POA: Diagnosis not present

## 2016-08-13 DIAGNOSIS — I96 Gangrene, not elsewhere classified: Secondary | ICD-10-CM | POA: Diagnosis not present

## 2016-08-13 DIAGNOSIS — Z992 Dependence on renal dialysis: Secondary | ICD-10-CM | POA: Diagnosis not present

## 2016-08-13 DIAGNOSIS — C9000 Multiple myeloma not having achieved remission: Secondary | ICD-10-CM | POA: Diagnosis not present

## 2016-08-14 ENCOUNTER — Other Ambulatory Visit: Payer: Self-pay | Admitting: Orthopedic Surgery

## 2016-08-14 ENCOUNTER — Encounter (HOSPITAL_BASED_OUTPATIENT_CLINIC_OR_DEPARTMENT_OTHER): Payer: Self-pay | Admitting: *Deleted

## 2016-08-14 DIAGNOSIS — E1029 Type 1 diabetes mellitus with other diabetic kidney complication: Secondary | ICD-10-CM | POA: Diagnosis not present

## 2016-08-14 DIAGNOSIS — T82598A Other mechanical complication of other cardiac and vascular devices and implants, initial encounter: Secondary | ICD-10-CM | POA: Diagnosis not present

## 2016-08-14 DIAGNOSIS — R509 Fever, unspecified: Secondary | ICD-10-CM | POA: Diagnosis not present

## 2016-08-14 DIAGNOSIS — N2581 Secondary hyperparathyroidism of renal origin: Secondary | ICD-10-CM | POA: Diagnosis not present

## 2016-08-14 DIAGNOSIS — D689 Coagulation defect, unspecified: Secondary | ICD-10-CM | POA: Diagnosis not present

## 2016-08-14 DIAGNOSIS — D631 Anemia in chronic kidney disease: Secondary | ICD-10-CM | POA: Diagnosis not present

## 2016-08-14 DIAGNOSIS — N186 End stage renal disease: Secondary | ICD-10-CM | POA: Diagnosis not present

## 2016-08-14 NOTE — Progress Notes (Signed)
Chart and cardiology notes reviewed by Dr Therisa Doyne, [place] for [facility].

## 2016-08-15 ENCOUNTER — Ambulatory Visit: Payer: Medicare Other | Attending: Orthopedic Surgery | Admitting: Physical Therapy

## 2016-08-15 ENCOUNTER — Encounter (HOSPITAL_BASED_OUTPATIENT_CLINIC_OR_DEPARTMENT_OTHER): Payer: Medicare Other

## 2016-08-15 ENCOUNTER — Telehealth: Payer: Self-pay | Admitting: Physical Therapy

## 2016-08-15 ENCOUNTER — Encounter: Payer: Self-pay | Admitting: Physical Therapy

## 2016-08-15 DIAGNOSIS — R2681 Unsteadiness on feet: Secondary | ICD-10-CM | POA: Diagnosis not present

## 2016-08-15 DIAGNOSIS — M6281 Muscle weakness (generalized): Secondary | ICD-10-CM | POA: Diagnosis not present

## 2016-08-15 DIAGNOSIS — M6249 Contracture of muscle, multiple sites: Secondary | ICD-10-CM | POA: Diagnosis not present

## 2016-08-15 DIAGNOSIS — R2689 Other abnormalities of gait and mobility: Secondary | ICD-10-CM | POA: Diagnosis not present

## 2016-08-15 NOTE — Telephone Encounter (Signed)
Dr. Fredna Dow, You are scheduled to perform a right (pt's dominant hand) middle finger amputation on Mr. Reddick next Tuesday, 08/20/16. Mr. [place] ambulates with a rolling walker and bilateral transtibial prostheses. He has excessive weight bearing thru his upper extremities on the walker.  I have cancelled his physical therapy appointments next week. He is scheduled to resume PT on Tuesday, 08/27/2016. Please let me know if I should hold PT longer since he has to use his hands on walker for all standing activities. I have recommended he continue to wear his prostheses all awake hours to minimize fall risk and lessen arm support in transfers. I recommended his family put prostheses on his limbs and remove them for him until you feel it is okay to use his right hand.  Please write orders in EPIC when you feel he is okay to resume PT & use right hand for functional tasks.  Thank you, Jamey Reas, PT, DPT PT Specializing in [place] @8 /02/2016@ 1:08 PM Phone:  [phone]  Fax:  [phone] [place] [address] [place], [place] [address]

## 2016-08-15 NOTE — Therapy (Signed)
[facility] [address] [place], [place], [address] Phone: [phone]   Fax:  [phone]  Physical Therapy Treatment  Patient Details  Name: Jack [name] MRN: [id number] Date of Birth: [birth year] Referring Provider: Meridee Score MD   Encounter Date: 08/15/2016      PT End of Session - 08/15/16 1249    Visit Number 11   Number of Visits 24   Date for PT Re-Evaluation 09/20/16   Authorization Type Medicare & G-codes every 10th visit    PT Start Time 0800   PT Stop Time 0844   PT Time Calculation (min) 44 min   Equipment Utilized During Treatment Gait belt   Activity Tolerance Patient tolerated treatment well   Behavior During Therapy [facility] for tasks assessed/performed      Past Medical History:  Diagnosis Date  . Anemia   . Asthma   . Contracture of joint    left knee  . Decubitus ulcer of sacral region, stage 2 03/07/2014  . Diabetes mellitus with peripheral vascular disease ([place])   . End-stage renal disease on hemodialysis [facility])    Started HD March 2014.  Cause of ESRD was DM.  Gets HD at [facility] on [address] on MWF schedule.   Marland Kitchen ESRD on hemodialysis ([place])   . Gangrene ([place])    right BKA  . Gangrene of foot ([place])   . Glaucoma   . Glaucoma 03/07/2014  . Heart murmur   . History of MRSA infection 04/22/2013   Bacteremia assoc w L foot wound infection Mar 2015   . Hyperparathyroidism, secondary renal ([place])   . Hypertension   . Hypothyroidism   . MRSA bacteremia   . Multiple myeloma, without mention of having achieved remission 03/30/2012   Cytogenetic neg on 03/23/2012.  Marland Kitchen Peripheral arterial disease ([place])   . Peripheral vascular disease, unspecified ([place]) 11/19/2012   In the past had R foot toe amps then R TMA. In 2015 had left foot toe amp > then TMA >then L BKA on 05/14/13   . Pneumonia    2012  . Thyroid disease    hyperparathyroidism    Past Surgical History:  Procedure Laterality Date   . ABDOMINAL AORTAGRAM Bilateral 11/06/2012   Procedure: ABDOMINAL AORTAGRAM;  Surgeon: Elam Dutch, MD;  Location: [facility] CATH LAB;  Service: Cardiovascular;  Laterality: Bilateral;  . AMPUTATION Right 11/10/2012   Procedure: AMPUTATION FIRST and SECOND TOES Right Foot;  Surgeon: Elam Dutch, MD;  Location: [place];  Service: Vascular;  Laterality: Right;  . AMPUTATION Left 04/07/2013   Procedure: AMPUTATION DIGIT- LEFT 1ST TOE;  Surgeon: Mal Misty, MD;  Location: [place];  Service: Vascular;  Laterality: Left;  . AMPUTATION Left 04/26/2013   Procedure: Left Foot Transmetatarsal Amputation;  Surgeon: Newt Minion, MD;  Location: [place];  Service: Orthopedics;  Laterality: Left;  . AMPUTATION Left 05/14/2013   Procedure: AMPUTATION BELOW KNEE;  Surgeon: Newt Minion, MD;  Location: [place];  Service: Orthopedics;  Laterality: Left;  Left Below Knee Amputation  . AMPUTATION Left 07/23/2013   Procedure: AMPUTATION BELOW KNEE;  Surgeon: Newt Minion, MD;  Location: [place];  Service: Orthopedics;  Laterality: Left;  Left Below Knee Amputation Revision  . AMPUTATION Right 02/18/2014   Procedure: AMPUTATION BELOW KNEE;  Surgeon: Newt Minion, MD;  Location: [place];  Service: Orthopedics;  Laterality: Right;  . AV FISTULA PLACEMENT Left 03/25/2012   Procedure: ARTERIOVENOUS (AV)  FISTULA CREATION;  Surgeon: Mal Misty, MD;  Location: [place];  Service: Vascular;  Laterality: Left;  . BELOW KNEE LEG AMPUTATION Left 05/14/2013   DR [name]  . BELOW KNEE LEG AMPUTATION Right 02/18/2014   dr duda  . CARDIAC CATHETERIZATION     approx 30 years ago  . CERVICAL DISC SURGERY    . EYE SURGERY Bilateral    CATARACTS  . I&D EXTREMITY Left 04/22/2013   Procedure: IRRIGATION AND DEBRIDEMENT LEFT FIRST TOE AMPUTATION WOUND ;  Surgeon: Mal Misty, MD;  Location: [place];  Service: Vascular;  Laterality: Left;  . I&D EXTREMITY Right 01/05/2014   Procedure: IRRIGATION AND DEBRIDEMENT Right Heel Ulcer;  Surgeon:  Mcarthur Rossetti, MD;  Location: [place];  Service: Orthopedics;  Laterality: Right;  Surgeon Available after 5PM  . INSERTION OF DIALYSIS CATHETER Right 03/19/2012   Procedure: INSERTION OF DIALYSIS CATHETER;  Surgeon: Mal Misty, MD;  Location: [place];  Service: Vascular;  Laterality: Right;  Right Internal Jugular  . LIGATION OF COMPETING BRANCHES OF ARTERIOVENOUS FISTULA Left 05/08/2012   Procedure: LIGATION OF COMPETING BRANCHES OF ARTERIOVENOUS FISTULA;  Surgeon: Mal Misty, MD;  Location: [place];  Service: Vascular;  Laterality: Left;  Ultrasound guided  . REPAIR QUADRICEPS/HAMSTRING MUSCLES Left 05/20/2014   Procedure: Left Hamstring Release;  Surgeon: Newt Minion, MD;  Location: [place];  Service: Orthopedics;  Laterality: Left;  . REVISON OF ARTERIOVENOUS FISTULA Left 01/30/2016   Procedure: CEPHALIC VEIN TURNDOWN TO LEFT UPPER ARM;  Surgeon: Elam Dutch, MD;  Location: [place];  Service: Vascular;  Laterality: Left;  . STUMP REVISION Right 05/20/2014   Procedure: Revision Right Below Knee Amputation;  Surgeon: Newt Minion, MD;  Location: [place];  Service: Orthopedics;  Laterality: Right;  . TEE WITHOUT CARDIOVERSION N/A 04/20/2013   Procedure: TRANSESOPHAGEAL ECHOCARDIOGRAM (TEE);  Surgeon: Josue Hector, MD;  Location: [facility] ENDOSCOPY;  Service: Cardiovascular;  Laterality: N/A;  . THYROIDECTOMY    . TOE AMPUTATION     D/C 04-30-13  . TRANSMETATARSAL AMPUTATION Left 12/16/2012   Procedure: TRANSMETATARSAL AMPUTATION AND VAC PLACEMENT;  Surgeon: Elam Dutch, MD;  Location: [place];  Service: Vascular;  Laterality: Left;    There were no vitals filed for this visit.      Subjective Assessment - 08/15/16 0801    Subjective He is having his right (dominant) middle finger next Tuesday, 08/20/2016.    Patient is accompained by: Family member   Pertinent History bilateral transtibial amputations, anemia, asthma, contracture of L knee, decubitus ulcer of sacral region, DM with PVD,  ESRD, glaucoma, heart murmur, HTN, hypothyroidism, multiple myeloma, PAD   Limitations Lifting;Standing;Walking;House hold activities   Patient Stated Goals walk without a person assisting him (walk with device only)    Currently in Pain? Yes   Pain Score 10-Worst pain ever   Pain Location Finger (Comment which one)  middle    Pain Orientation Right   Pain Descriptors / Indicators Aching;Numbness   Pain Type Acute pain   Pain Onset 1 to 4 weeks ago   Pain Frequency Constant   Aggravating Factors  touching,    Pain Relieving Factors pain meds                         [facility] Adult PT Treatment/Exercise - 08/15/16 0800      Transfers   Transfers Sit to Stand;Stand to Sit   Sit to  Stand 5: Supervision;With upper extremity assist;With armrests;From chair/3-in-1  to rollator   Sit to Stand Details Verbal cues for sequencing;Verbal cues for technique;Verbal cues for precautions/safety   Stand to Sit 5: Supervision;With upper extremity assist;With armrests;To chair/3-in-1  from rollator   Stand to Sit Details (indicate cue type and reason) Verbal cues for safe use of DME/AE;Verbal cues for technique;Visual cues/gestures for precautions/safety;Verbal cues for precautions/safety     Ambulation/Gait   Ambulation/Gait Yes   Ambulation/Gait Assistance 4: Min guard   Ambulation/Gait Assistance Details Verbal & tactile cues on posture, position within RW and step length.    Ambulation Distance (Feet) 125 Feet   Assistive device Rollator;Prosthesis   Gait Pattern Step-through pattern;Left flexed knee in stance;Right flexed knee in stance;Wide base of support;Poor foot clearance - left;Poor foot clearance - right;Trunk flexed   Ambulation Surface Indoor;Level   Ramp 3: Mod assist  rollator walker & prostheses.    Ramp Details (indicate cue type and reason) verbal & manual cues for technique including posture, walker control & wt shift.    Curb 3: Mod assist  rollator walker &  prostheses   Curb Details (indicate cue type and reason) verbal & manual cues on technique including walker control and sequence.      Prosthetics   Prosthetic Care Comments  PT recommended wearing prostheses even to surgical center to minimize fall risk. Have his family donne & doffe prostheses until his finger/hand heals to point surgeon permits use.    Education Provided Proper wear schedule/adjustment   Person(s) Educated Patient;Child(ren)   Education Method Explanation   Education Method Verbalized understanding                PT Education - 08/15/16 0815    Education provided Yes   Education Details why his forearm & wrist hurt with burn to distal finger. Need for clearance from surgeon to use RUE on walker prior to returning to PT.    Person(s) Educated Patient   Methods Explanation   Comprehension Verbalized understanding          PT Short Term Goals - 08/01/16 1924      PT SHORT TERM GOAL #1   Title Patient will verbalize understanding and return demonstration of updated HEP. Target 08/23/2016     Time 4   Period Weeks   Status Revised     PT SHORT TERM GOAL #2   Title Patient will demonstrate ability to reach 5 inches anteriorly and reach within 10 inches from the floor unilateral UE support on RW with supervision to indicate a decrease in his risk of falling.  Target 08/23/2016     Time 4   Period Weeks   Status On-going     PT SHORT TERM GOAL #3   Title TUG with rollator walker <40 seconds with supervision. Target 08/23/2016     Time 4   Period Weeks   Status New     PT SHORT TERM GOAL #4   Title Patient will demonstrate ability to ambulate 150 feet on indoor surfaces with rollator style walker and supervision to indicate a decrease in his risk of falling.   Target 08/23/2016     Time 4   Period Weeks   Status New           PT Long Term Goals - 08/01/16 1928      PT LONG TERM GOAL #1   Title Patient will verbalize understanding and return  demonstration of ongoing HEP program. (TARGET  DATE: 09/23/2016)    Time 2   Period Months   Status On-going     PT LONG TERM GOAL #2   Title Patient will ambulate 200 feet on firm, paved outdoor surfaces with family support to indicate a decrease risk of falling when returning to community ambulation with family support.   (TARGET DATE: 09/23/2016)    Time 2   Period Months   Status Revised     PT LONG TERM GOAL #3   Title Patient ambulates 78' with standard RW and prostheses around furniture modified independent.   (TARGET DATE: 09/23/2016)    Time 2   Period Months   Status New     PT LONG TERM GOAL #4   Title Patient will be able to ambulate over ramp/curb with rollator style walker with family assistance to indicate a decrease in his risk of falling when returning to community ambulation with family.   (TARGET DATE: 09/23/2016)    Time 2   Period Months   Status On-going     PT LONG TERM GOAL #5   Title Patient will demonstrate ability to reach 8 inches anteriorly and reach within 8 inches from the floor unilateral UE support on RW with supervision to indicate a decrease in his risk of falling.   (TARGET DATE: 09/23/2016)    Time 2   Period Months   Status On-going               Plan - 08/15/16 1250    Clinical Impression Statement Skilled session focused on community based gait activities. He improved negotiation of ramps & curbs with constant verbal cues for technique.    Rehab Potential Good   Clinical Impairments Affecting Rehab Potential bilateral transtibial amputations, anemia, asthma, contracture of L knee, decubitus ulcer of sacral region, DM with PVD, ESRD, glaucoma, heart murmur, HTN, hypothyroidism, multiple myeloma, PAD   PT Frequency 2x / week   PT Duration 8 weeks   PT Treatment/Interventions ADLs/Self Care Home Management;Neuromuscular re-education;Balance training;Therapeutic exercise;Therapeutic activities;Functional mobility training;Stair training;Gait  training;DME Instruction;Patient/family education;Prosthetic Training;Manual techniques;Scar mobilization;Passive range of motion   PT Next Visit Plan Hold PT for next week due to scheduled finger amputation. Check STGs.  Prosthetic gait with rollator including ramp & curb. Follow up on supine hip flex stretch with opposite quad set,    Consulted and Agree with Plan of Care Patient;Family member/caregiver   Family Member Consulted dtr-Cory      Patient will benefit from skilled therapeutic intervention in order to improve the following deficits and impairments:  Abnormal gait, Decreased activity tolerance, Decreased balance, Decreased coordination, Decreased range of motion, Decreased safety awareness, Decreased knowledge of use of DME, Decreased knowledge of precautions, Decreased endurance, Decreased strength, Difficulty walking, Prosthetic Dependency, Postural dysfunction  Visit Diagnosis: Muscle weakness (generalized)  Unsteadiness on feet  Other abnormalities of gait and mobility     Problem List Patient Active Problem List   Diagnosis Date Noted  . Dyslipidemia 11/21/2015  . Diabetic retinopathy ([place])- left eye 10/02/2015  . Complications, amputation stump late ([place]) 05/20/2014  . Weakness generalized 04/07/2014  . Sepsis ([place]) 04/07/2014  . Decubitus ulcer, stage II 04/07/2014  . Decubitus ulcer of ankle   . Fatigue   . Wound infection 04/06/2014  . Decubitus ulcer of sacral region, stage 2 03/07/2014  . Glaucoma 03/07/2014  . Gout 03/07/2014  . Below knee amputation status ([place]) 02/18/2014  . Osteomyelitis ([place]) 01/04/2014  . Phantom limb ([place]) 12/14/2013  . Type  2 diabetes mellitus with diabetic foot infection ([place])   . Diabetes mellitus with peripheral vascular disease ([place])   . Asthma 05/17/2013  . S/P bilateral below knee amputation ([place]) 05/17/2013  . History of MRSA infection 04/22/2013  . Peripheral vascular disease ([place]) 11/19/2012  . End-stage renal disease  on hemodialysis ([place]) 05/05/2012  . Kahler disease ([place]) 03/30/2012  . Anemia in chronic kidney disease 03/19/2012  . Hypothyroidism following radioiodine therapy 03/19/2012  . Anemia, iron deficiency 03/19/2012  . Hypertension 03/18/2012  . Chronic kidney disease (CKD), stage V ([place]) 03/18/2012  . Cardiac conduction disorder 03/18/2012  . MGUS (monoclonal gammopathy of unknown significance) 02/28/2011  . Monoclonal paraproteinemia 02/28/2011    Jamey Reas PT, DPT 08/15/2016, 12:59 PM  [place] [address] [place], [place], [address] Phone: [phone]   Fax:  [phone]  Name: Jack [name] MRN: [id number] Date of Birth: November 22, 1945

## 2016-08-16 ENCOUNTER — Other Ambulatory Visit: Payer: Self-pay | Admitting: Family Medicine

## 2016-08-16 DIAGNOSIS — N186 End stage renal disease: Secondary | ICD-10-CM | POA: Diagnosis not present

## 2016-08-16 DIAGNOSIS — N2581 Secondary hyperparathyroidism of renal origin: Secondary | ICD-10-CM | POA: Diagnosis not present

## 2016-08-16 DIAGNOSIS — R509 Fever, unspecified: Secondary | ICD-10-CM | POA: Diagnosis not present

## 2016-08-16 DIAGNOSIS — E1029 Type 1 diabetes mellitus with other diabetic kidney complication: Secondary | ICD-10-CM | POA: Diagnosis not present

## 2016-08-16 DIAGNOSIS — E1151 Type 2 diabetes mellitus with diabetic peripheral angiopathy without gangrene: Secondary | ICD-10-CM

## 2016-08-16 DIAGNOSIS — T82598A Other mechanical complication of other cardiac and vascular devices and implants, initial encounter: Secondary | ICD-10-CM | POA: Diagnosis not present

## 2016-08-16 DIAGNOSIS — D689 Coagulation defect, unspecified: Secondary | ICD-10-CM | POA: Diagnosis not present

## 2016-08-16 DIAGNOSIS — D631 Anemia in chronic kidney disease: Secondary | ICD-10-CM | POA: Diagnosis not present

## 2016-08-19 DIAGNOSIS — R509 Fever, unspecified: Secondary | ICD-10-CM | POA: Diagnosis not present

## 2016-08-19 DIAGNOSIS — D631 Anemia in chronic kidney disease: Secondary | ICD-10-CM | POA: Diagnosis not present

## 2016-08-19 DIAGNOSIS — T82598A Other mechanical complication of other cardiac and vascular devices and implants, initial encounter: Secondary | ICD-10-CM | POA: Diagnosis not present

## 2016-08-19 DIAGNOSIS — N186 End stage renal disease: Secondary | ICD-10-CM | POA: Diagnosis not present

## 2016-08-19 DIAGNOSIS — N2581 Secondary hyperparathyroidism of renal origin: Secondary | ICD-10-CM | POA: Diagnosis not present

## 2016-08-19 DIAGNOSIS — E1029 Type 1 diabetes mellitus with other diabetic kidney complication: Secondary | ICD-10-CM | POA: Diagnosis not present

## 2016-08-19 DIAGNOSIS — D689 Coagulation defect, unspecified: Secondary | ICD-10-CM | POA: Diagnosis not present

## 2016-08-20 ENCOUNTER — Ambulatory Visit (HOSPITAL_BASED_OUTPATIENT_CLINIC_OR_DEPARTMENT_OTHER)
Admission: RE | Admit: 2016-08-20 | Discharge: 2016-08-20 | Disposition: A | Discharge status: Home / Self Care | Payer: Medicare Other | Source: Ambulatory Visit | Attending: Orthopedic Surgery | Admitting: Orthopedic Surgery

## 2016-08-20 ENCOUNTER — Ambulatory Visit (HOSPITAL_BASED_OUTPATIENT_CLINIC_OR_DEPARTMENT_OTHER): Payer: Medicare Other | Admitting: Anesthesiology

## 2016-08-20 ENCOUNTER — Encounter (HOSPITAL_BASED_OUTPATIENT_CLINIC_OR_DEPARTMENT_OTHER)
Admission: RE | Disposition: A | Discharge status: Home / Self Care | Payer: Self-pay | Source: Ambulatory Visit | Attending: Orthopedic Surgery

## 2016-08-20 ENCOUNTER — Encounter: Payer: Medicare Other | Admitting: Physical Therapy

## 2016-08-20 ENCOUNTER — Encounter (HOSPITAL_BASED_OUTPATIENT_CLINIC_OR_DEPARTMENT_OTHER): Payer: Self-pay | Admitting: Certified Registered"

## 2016-08-20 DIAGNOSIS — Z89421 Acquired absence of other right toe(s): Secondary | ICD-10-CM | POA: Insufficient documentation

## 2016-08-20 DIAGNOSIS — Z89512 Acquired absence of left leg below knee: Secondary | ICD-10-CM | POA: Insufficient documentation

## 2016-08-20 DIAGNOSIS — N2581 Secondary hyperparathyroidism of renal origin: Secondary | ICD-10-CM | POA: Diagnosis not present

## 2016-08-20 DIAGNOSIS — E039 Hypothyroidism, unspecified: Secondary | ICD-10-CM | POA: Insufficient documentation

## 2016-08-20 DIAGNOSIS — Z89412 Acquired absence of left great toe: Secondary | ICD-10-CM | POA: Diagnosis not present

## 2016-08-20 DIAGNOSIS — E1152 Type 2 diabetes mellitus with diabetic peripheral angiopathy with gangrene: Secondary | ICD-10-CM | POA: Diagnosis not present

## 2016-08-20 DIAGNOSIS — Z885 Allergy status to narcotic agent status: Secondary | ICD-10-CM | POA: Diagnosis not present

## 2016-08-20 DIAGNOSIS — H409 Unspecified glaucoma: Secondary | ICD-10-CM | POA: Insufficient documentation

## 2016-08-20 DIAGNOSIS — Z8614 Personal history of Methicillin resistant Staphylococcus aureus infection: Secondary | ICD-10-CM | POA: Insufficient documentation

## 2016-08-20 DIAGNOSIS — E1122 Type 2 diabetes mellitus with diabetic chronic kidney disease: Secondary | ICD-10-CM | POA: Diagnosis not present

## 2016-08-20 DIAGNOSIS — Z992 Dependence on renal dialysis: Secondary | ICD-10-CM | POA: Insufficient documentation

## 2016-08-20 DIAGNOSIS — I96 Gangrene, not elsewhere classified: Secondary | ICD-10-CM | POA: Diagnosis not present

## 2016-08-20 DIAGNOSIS — Z91041 Radiographic dye allergy status: Secondary | ICD-10-CM | POA: Insufficient documentation

## 2016-08-20 DIAGNOSIS — Z91030 Bee allergy status: Secondary | ICD-10-CM | POA: Insufficient documentation

## 2016-08-20 DIAGNOSIS — R011 Cardiac murmur, unspecified: Secondary | ICD-10-CM | POA: Diagnosis not present

## 2016-08-20 DIAGNOSIS — E1169 Type 2 diabetes mellitus with other specified complication: Secondary | ICD-10-CM | POA: Diagnosis not present

## 2016-08-20 DIAGNOSIS — I120 Hypertensive chronic kidney disease with stage 5 chronic kidney disease or end stage renal disease: Secondary | ICD-10-CM | POA: Diagnosis not present

## 2016-08-20 DIAGNOSIS — Z888 Allergy status to other drugs, medicaments and biological substances status: Secondary | ICD-10-CM | POA: Insufficient documentation

## 2016-08-20 DIAGNOSIS — Z87891 Personal history of nicotine dependence: Secondary | ICD-10-CM | POA: Insufficient documentation

## 2016-08-20 DIAGNOSIS — N186 End stage renal disease: Secondary | ICD-10-CM | POA: Insufficient documentation

## 2016-08-20 DIAGNOSIS — E11622 Type 2 diabetes mellitus with other skin ulcer: Secondary | ICD-10-CM | POA: Insufficient documentation

## 2016-08-20 DIAGNOSIS — M86141 Other acute osteomyelitis, right hand: Secondary | ICD-10-CM | POA: Diagnosis not present

## 2016-08-20 DIAGNOSIS — Z89411 Acquired absence of right great toe: Secondary | ICD-10-CM | POA: Diagnosis not present

## 2016-08-20 DIAGNOSIS — J45909 Unspecified asthma, uncomplicated: Secondary | ICD-10-CM | POA: Diagnosis not present

## 2016-08-20 DIAGNOSIS — Z7982 Long term (current) use of aspirin: Secondary | ICD-10-CM | POA: Insufficient documentation

## 2016-08-20 DIAGNOSIS — L98499 Non-pressure chronic ulcer of skin of other sites with unspecified severity: Secondary | ICD-10-CM | POA: Insufficient documentation

## 2016-08-20 DIAGNOSIS — Z79899 Other long term (current) drug therapy: Secondary | ICD-10-CM | POA: Insufficient documentation

## 2016-08-20 DIAGNOSIS — Z89511 Acquired absence of right leg below knee: Secondary | ICD-10-CM | POA: Diagnosis not present

## 2016-08-20 DIAGNOSIS — Z7984 Long term (current) use of oral hypoglycemic drugs: Secondary | ICD-10-CM | POA: Insufficient documentation

## 2016-08-20 HISTORY — PX: AMPUTATION: SHX166

## 2016-08-20 LAB — POCT I-STAT, CHEM 8
BUN: 24 mg/dL — AB (ref 6–20)
CHLORIDE: 101 mmol/L (ref 101–111)
CREATININE: 6.6 mg/dL — AB (ref 0.61–1.24)
Calcium, Ion: 1.18 mmol/L (ref 1.15–1.40)
Glucose, Bld: 72 mg/dL (ref 65–99)
HEMATOCRIT: 35 % — AB (ref 39.0–52.0)
Hemoglobin: 11.9 g/dL — ABNORMAL LOW (ref 13.0–17.0)
POTASSIUM: 4.2 mmol/L (ref 3.5–5.1)
Sodium: 140 mmol/L (ref 135–145)
TCO2: 25 mmol/L (ref 0–100)

## 2016-08-20 LAB — GLUCOSE, CAPILLARY
Glucose-Capillary: 65 mg/dL (ref 65–99)
Glucose-Capillary: 90 mg/dL (ref 65–99)

## 2016-08-20 SURGERY — AMPUTATION DIGIT
Anesthesia: General | Site: Finger | Laterality: Right

## 2016-08-20 MED ORDER — MIDAZOLAM HCL 2 MG/2ML IJ SOLN: 1.0000 mg | INTRAMUSCULAR | Status: DC | PRN

## 2016-08-20 MED ORDER — PROPOFOL 10 MG/ML IV BOLUS
INTRAVENOUS | Status: AC
  Filled 2016-08-20: qty 20

## 2016-08-20 MED ORDER — CEFAZOLIN SODIUM-DEXTROSE 2-4 GM/100ML-% IV SOLN
INTRAVENOUS | Status: AC
  Filled 2016-08-20: qty 100

## 2016-08-20 MED ORDER — SODIUM CHLORIDE 0.9 % IV SOLN
INTRAVENOUS | Status: DC
  Administered 2016-08-20: 13:00:00 via INTRAVENOUS

## 2016-08-20 MED ORDER — ONDANSETRON HCL 4 MG/2ML IJ SOLN
INTRAMUSCULAR | Status: AC
  Filled 2016-08-20: qty 8

## 2016-08-20 MED ORDER — CHLORHEXIDINE GLUCONATE 4 % EX LIQD: 60.0000 mL | Freq: Once | CUTANEOUS | Status: DC

## 2016-08-20 MED ORDER — DEXAMETHASONE SODIUM PHOSPHATE 10 MG/ML IJ SOLN
INTRAMUSCULAR | Status: AC
  Filled 2016-08-20: qty 2

## 2016-08-20 MED ORDER — CEFAZOLIN SODIUM-DEXTROSE 2-4 GM/100ML-% IV SOLN
2.0000 g | INTRAVENOUS | Status: AC
  Administered 2016-08-20: 2 g via INTRAVENOUS

## 2016-08-20 MED ORDER — DEXAMETHASONE SODIUM PHOSPHATE 10 MG/ML IJ SOLN
INTRAMUSCULAR | Status: AC
  Filled 2016-08-20: qty 1

## 2016-08-20 MED ORDER — LACTATED RINGERS IV SOLN: INTRAVENOUS | Status: DC

## 2016-08-20 MED ORDER — ONDANSETRON HCL 4 MG/2ML IJ SOLN
INTRAMUSCULAR | Status: AC
  Filled 2016-08-20: qty 2

## 2016-08-20 MED ORDER — BUPIVACAINE HCL (PF) 0.5 % IJ SOLN
INTRAMUSCULAR | Status: DC | PRN
  Administered 2016-08-20: 9 mL

## 2016-08-20 MED ORDER — OXYCODONE-ACETAMINOPHEN 5-325 MG PO TABS: ORAL_TABLET | ORAL | 0 refills | Status: DC

## 2016-08-20 MED ORDER — LIDOCAINE 2% (20 MG/ML) 5 ML SYRINGE
INTRAMUSCULAR | Status: AC
  Filled 2016-08-20: qty 10

## 2016-08-20 MED ORDER — SCOPOLAMINE 1 MG/3DAYS TD PT72: 1.0000 | MEDICATED_PATCH | Freq: Once | TRANSDERMAL | Status: DC | PRN

## 2016-08-20 MED ORDER — FENTANYL CITRATE (PF) 100 MCG/2ML IJ SOLN
INTRAMUSCULAR | Status: AC
  Filled 2016-08-20: qty 2

## 2016-08-20 MED ORDER — ONDANSETRON HCL 4 MG/2ML IJ SOLN
INTRAMUSCULAR | Status: DC | PRN
  Administered 2016-08-20: 4 mg via INTRAVENOUS

## 2016-08-20 MED ORDER — LIDOCAINE 2% (20 MG/ML) 5 ML SYRINGE
INTRAMUSCULAR | Status: DC | PRN
  Administered 2016-08-20: 60 mg via INTRAVENOUS

## 2016-08-20 MED ORDER — LIDOCAINE 2% (20 MG/ML) 5 ML SYRINGE
INTRAMUSCULAR | Status: AC
  Filled 2016-08-20: qty 5

## 2016-08-20 MED ORDER — PROPOFOL 10 MG/ML IV BOLUS
INTRAVENOUS | Status: DC | PRN
  Administered 2016-08-20: 70 mg via INTRAVENOUS

## 2016-08-20 MED ORDER — FENTANYL CITRATE (PF) 100 MCG/2ML IJ SOLN
50.0000 ug | INTRAMUSCULAR | Status: DC | PRN
  Administered 2016-08-20: 50 ug via INTRAVENOUS

## 2016-08-20 SURGICAL SUPPLY — 47 items
BANDAGE ACE 3X5.8 VEL STRL LF (GAUZE/BANDAGES/DRESSINGS) IMPLANT
BLADE MINI RND TIP GREEN BEAV (BLADE) IMPLANT
BLADE SURG 15 STRL LF DISP TIS (BLADE) ×3 IMPLANT
BLADE SURG 15 STRL SS (BLADE) ×6
BNDG COHESIVE 1X5 TAN STRL LF (GAUZE/BANDAGES/DRESSINGS) IMPLANT
BNDG ELASTIC 2X5.8 VLCR STR LF (GAUZE/BANDAGES/DRESSINGS) IMPLANT
BNDG ESMARK 4X9 LF (GAUZE/BANDAGES/DRESSINGS) IMPLANT
BNDG GAUZE 1X2.1 STRL (MISCELLANEOUS) IMPLANT
CHLORAPREP W/TINT 26ML (MISCELLANEOUS) ×3 IMPLANT
CORD BIPOLAR FORCEPS 12FT (ELECTRODE) ×3 IMPLANT
COVER BACK TABLE 60X90IN (DRAPES) ×3 IMPLANT
COVER MAYO STAND STRL (DRAPES) ×3 IMPLANT
DECANTER SPIKE VIAL GLASS SM (MISCELLANEOUS) IMPLANT
DRAIN PENROSE 1/4X12 LTX STRL (WOUND CARE) IMPLANT
DRAPE EXTREMITY T 121X128X90 (DRAPE) ×3 IMPLANT
DRAPE OEC MINIVIEW 54X84 (DRAPES) IMPLANT
DRAPE SURG 17X23 STRL (DRAPES) ×3 IMPLANT
GAUZE SPONGE 4X4 12PLY STRL (GAUZE/BANDAGES/DRESSINGS) ×3 IMPLANT
GAUZE SPONGE 4X4 12PLY STRL LF (GAUZE/BANDAGES/DRESSINGS) IMPLANT
GAUZE XEROFORM 1X8 LF (GAUZE/BANDAGES/DRESSINGS) ×3 IMPLANT
GLOVE BIO SURGEON STRL SZ7.5 (GLOVE) ×3 IMPLANT
GLOVE BIOGEL PI IND STRL 7.0 (GLOVE) ×1 IMPLANT
GLOVE BIOGEL PI IND STRL 8 (GLOVE) ×1 IMPLANT
GLOVE BIOGEL PI INDICATOR 7.0 (GLOVE) ×2
GLOVE BIOGEL PI INDICATOR 8 (GLOVE) ×2
GLOVE ECLIPSE 6.5 STRL STRAW (GLOVE) ×3 IMPLANT
GOWN STRL REUS W/ TWL LRG LVL3 (GOWN DISPOSABLE) ×1 IMPLANT
GOWN STRL REUS W/TWL LRG LVL3 (GOWN DISPOSABLE) ×2
GOWN STRL REUS W/TWL XL LVL3 (GOWN DISPOSABLE) ×3 IMPLANT
NEEDLE HYPO 25X1 1.5 SAFETY (NEEDLE) ×3 IMPLANT
NS IRRIG 1000ML POUR BTL (IV SOLUTION) ×3 IMPLANT
PACK BASIN DAY SURGERY FS (CUSTOM PROCEDURE TRAY) ×3 IMPLANT
PADDING CAST ABS 4INX4YD NS (CAST SUPPLIES)
PADDING CAST ABS COTTON 4X4 ST (CAST SUPPLIES) IMPLANT
STOCKINETTE 4X48 STRL (DRAPES) ×3 IMPLANT
SUT CHROMIC 4 0 P 3 18 (SUTURE) IMPLANT
SUT ETHILON 3 0 PS 1 (SUTURE) IMPLANT
SUT ETHILON 4 0 PS 2 18 (SUTURE) IMPLANT
SUT MERSILENE 4 0 P 3 (SUTURE) IMPLANT
SUT MON AB 5-0 P3 18 (SUTURE) ×3 IMPLANT
SUT VIC AB 4-0 P-3 18XBRD (SUTURE) IMPLANT
SUT VIC AB 4-0 P3 18 (SUTURE)
SYR BULB 3OZ (MISCELLANEOUS) ×3 IMPLANT
SYR CONTROL 10ML LL (SYRINGE) ×3 IMPLANT
TOWEL OR 17X24 6PK STRL BLUE (TOWEL DISPOSABLE) ×3 IMPLANT
TRAY DSU PREP LF (CUSTOM PROCEDURE TRAY) ×3 IMPLANT
UNDERPAD 30X30 (UNDERPADS AND DIAPERS) ×3 IMPLANT

## 2016-08-20 NOTE — Op Note (Signed)
[id number] 

## 2016-08-20 NOTE — Anesthesia Procedure Notes (Addendum)
Procedure Name: LMA Insertion Date/Time: 08/20/2016 1:39 PM Performed by: Maryella Shivers Pre-anesthesia Checklist: Patient identified, Emergency Drugs available, Suction available and Patient being monitored Patient Re-evaluated:Patient Re-evaluated prior to induction Oxygen Delivery Method: Circle system utilized Preoxygenation: Pre-oxygenation with 100% oxygen Induction Type: IV induction Ventilation: Mask ventilation without difficulty LMA: LMA with gastric port inserted LMA Size: 5.0 Number of attempts: 2 (#4 to #5) Airway Equipment and Method: Bite block Placement Confirmation: positive ETCO2 Tube secured with: Tape Dental Injury: Teeth and Oropharynx as per pre-operative assessment

## 2016-08-20 NOTE — H&P (Signed)
Jack [name] is an 71 y.o. male.   Chief Complaint: right long finger necrosis HPI: 71 yo rhd male with necrosis and pain of end of right long finger.  He wishes to amputation of right long finger at middle phalanx for management of condition.  Allergies:  Allergies  Allergen Reactions  . Bee Venom Anaphylaxis  . Lisinopril Cough  . Ivp Dye [Iodinated Diagnostic Agents] Hives  . Morphine And Related Hives and Other (See Comments)    Bradycardia states patient    Past Medical History:  Diagnosis Date  . Anemia   . Asthma   . Contracture of joint    left knee  . Decubitus ulcer of sacral region, stage 2 03/07/2014  . Diabetes mellitus with peripheral vascular disease ([place])   . End-stage renal disease on hemodialysis [facility])    Started HD March 2014.  Cause of ESRD was DM.  Gets HD at [facility] on [place] on MWF schedule.   Marland Kitchen ESRD on hemodialysis ([place])   . Gangrene ([place])    right BKA  . Gangrene of foot ([place])   . Glaucoma   . Glaucoma 03/07/2014  . Heart murmur   . History of MRSA infection 04/22/2013   Bacteremia assoc w L foot wound infection Mar 2015   . Hyperparathyroidism, secondary renal ([place])   . Hypertension   . Hypothyroidism   . MRSA bacteremia   . Multiple myeloma, without mention of having achieved remission 03/30/2012   Cytogenetic neg on 03/23/2012.  Marland Kitchen Peripheral arterial disease ([place])   . Peripheral vascular disease, unspecified ([place]) 11/19/2012   In the past had R foot toe amps then R TMA. In 2015 had left foot toe amp > then TMA >then L BKA on 05/14/13   . Pneumonia    2012  . Thyroid disease    hyperparathyroidism    Past Surgical History:  Procedure Laterality Date  . ABDOMINAL AORTAGRAM Bilateral 11/06/2012   Procedure: ABDOMINAL AORTAGRAM;  Surgeon: Elam Dutch, MD;  Location: [facility] CATH LAB;  Service: Cardiovascular;  Laterality: Bilateral;  . AMPUTATION Right 11/10/2012   Procedure: AMPUTATION FIRST and SECOND TOES Right Foot;  Surgeon:  Elam Dutch, MD;  Location: [place];  Service: Vascular;  Laterality: Right;  . AMPUTATION Left 04/07/2013   Procedure: AMPUTATION DIGIT- LEFT 1ST TOE;  Surgeon: Mal Misty, MD;  Location: [place];  Service: Vascular;  Laterality: Left;  . AMPUTATION Left 04/26/2013   Procedure: Left Foot Transmetatarsal Amputation;  Surgeon: Newt Minion, MD;  Location: [place];  Service: Orthopedics;  Laterality: Left;  . AMPUTATION Left 05/14/2013   Procedure: AMPUTATION BELOW KNEE;  Surgeon: Newt Minion, MD;  Location: [place];  Service: Orthopedics;  Laterality: Left;  Left Below Knee Amputation  . AMPUTATION Left 07/23/2013   Procedure: AMPUTATION BELOW KNEE;  Surgeon: Newt Minion, MD;  Location: [place];  Service: Orthopedics;  Laterality: Left;  Left Below Knee Amputation Revision  . AMPUTATION Right 02/18/2014   Procedure: AMPUTATION BELOW KNEE;  Surgeon: Newt Minion, MD;  Location: [place];  Service: Orthopedics;  Laterality: Right;  . AV FISTULA PLACEMENT Left 03/25/2012   Procedure: ARTERIOVENOUS (AV) FISTULA CREATION;  Surgeon: Mal Misty, MD;  Location: [place];  Service: Vascular;  Laterality: Left;  . BELOW KNEE LEG AMPUTATION Left 05/14/2013   DR [name]  . BELOW KNEE LEG AMPUTATION Right 02/18/2014   dr duda  . CARDIAC CATHETERIZATION     approx  30 years ago  . CERVICAL DISC SURGERY    . EYE SURGERY Bilateral    CATARACTS  . I&D EXTREMITY Left 04/22/2013   Procedure: IRRIGATION AND DEBRIDEMENT LEFT FIRST TOE AMPUTATION WOUND ;  Surgeon: Mal Misty, MD;  Location: [place];  Service: Vascular;  Laterality: Left;  . I&D EXTREMITY Right 01/05/2014   Procedure: IRRIGATION AND DEBRIDEMENT Right Heel Ulcer;  Surgeon: Mcarthur Rossetti, MD;  Location: [place];  Service: Orthopedics;  Laterality: Right;  Surgeon Available after 5PM  . INSERTION OF DIALYSIS CATHETER Right 03/19/2012   Procedure: INSERTION OF DIALYSIS CATHETER;  Surgeon: Mal Misty, MD;  Location: [place];  Service: Vascular;   Laterality: Right;  Right Internal Jugular  . LIGATION OF COMPETING BRANCHES OF ARTERIOVENOUS FISTULA Left 05/08/2012   Procedure: LIGATION OF COMPETING BRANCHES OF ARTERIOVENOUS FISTULA;  Surgeon: Mal Misty, MD;  Location: [place];  Service: Vascular;  Laterality: Left;  Ultrasound guided  . REPAIR QUADRICEPS/HAMSTRING MUSCLES Left 05/20/2014   Procedure: Left Hamstring Release;  Surgeon: Newt Minion, MD;  Location: [place];  Service: Orthopedics;  Laterality: Left;  . REVISON OF ARTERIOVENOUS FISTULA Left 01/30/2016   Procedure: CEPHALIC VEIN TURNDOWN TO LEFT UPPER ARM;  Surgeon: Elam Dutch, MD;  Location: [place];  Service: Vascular;  Laterality: Left;  . STUMP REVISION Right 05/20/2014   Procedure: Revision Right Below Knee Amputation;  Surgeon: Newt Minion, MD;  Location: [place];  Service: Orthopedics;  Laterality: Right;  . TEE WITHOUT CARDIOVERSION N/A 04/20/2013   Procedure: TRANSESOPHAGEAL ECHOCARDIOGRAM (TEE);  Surgeon: Josue Hector, MD;  Location: [facility] ENDOSCOPY;  Service: Cardiovascular;  Laterality: N/A;  . THYROIDECTOMY    . TOE AMPUTATION     D/C 04-30-13  . TRANSMETATARSAL AMPUTATION Left 12/16/2012   Procedure: TRANSMETATARSAL AMPUTATION AND VAC PLACEMENT;  Surgeon: Elam Dutch, MD;  Location: [facility] OR;  Service: Vascular;  Laterality: Left;    Family History: Family History  Problem Relation Age of Onset  . Diabetes Mother   . Cancer Mother        bone   . Kidney disease Father     Social History:   reports that he quit smoking about 34 years ago. His smoking use included Cigarettes. He has never used smokeless tobacco. He reports that he drinks alcohol. He reports that he does not use drugs.  Medications: Medications Prior to Admission  Medication Sig Dispense Refill  . aspirin EC 81 MG tablet Take 81 mg by mouth daily.    Marland Kitchen atorvastatin (LIPITOR) 10 MG tablet Take 1 tablet (10 mg total) by mouth daily. 90 tablet 1  . calcium acetate (PHOSLO) 667 MG capsule Take  1,334 mg by mouth 3 (three) times daily with meals.    . diphenhydrAMINE (BENADRYL) 25 MG tablet Take 100 mg by mouth as directed. The morning of fistula cleaning    . glipiZIDE (GLUCOTROL) 5 MG tablet TAKE 0.5 TABLETS (2.5 MG TOTAL) BY MOUTH DAILY BEFORE BREAKFAST. 45 tablet 2  . levothyroxine (SYNTHROID, LEVOTHROID) 150 MCG tablet Take 1/2 tablet by mouth on Mondays and 1 tablet by mouth the rest of the week. 90 tablet 1  . multivitamin (RENA-VIT) TABS tablet Take 1 tablet by mouth at bedtime.    Marland Kitchen oxyCODONE-acetaminophen (ROXICET) 5-325 MG tablet Take 1 tablet by mouth every 12 (twelve) hours as needed for moderate pain or severe pain. 60 tablet 0  . glucose blood test strip Use to test blood sugars  once daily. 100 each 3  . ONETOUCH DELICA LANCETS 85T MISC Use to test blood sugars once daily. 100 each 3    Results for orders placed or performed during the hospital encounter of 08/20/16 (from the past 48 hour(s))  I-STAT, chem 8     Status: Abnormal   Collection Time: 08/20/16 12:46 PM  Result Value Ref Range   Sodium 140 135 - 145 mmol/L   Potassium 4.2 3.5 - 5.1 mmol/L   Chloride 101 101 - 111 mmol/L   BUN 24 (H) 6 - 20 mg/dL   Creatinine, Ser 6.60 (H) 0.61 - 1.24 mg/dL   Glucose, Bld 72 65 - 99 mg/dL   Calcium, Ion 1.18 1.15 - 1.40 mmol/L   TCO2 25 0 - 100 mmol/L   Hemoglobin 11.9 (L) 13.0 - 17.0 g/dL   HCT 35.0 (L) 39.0 - 52.0 %    No results found.   A comprehensive review of systems was negative except for: Constitutional: positive for sweats Gastrointestinal: positive for constipation  Blood pressure (!) 145/80, temperature 98.6 F (37 C), temperature source Oral, resp. rate 16, height _0  (1.778 m), weight 73.9 kg (163 lb), SpO2 100 %.  General appearance: alert, cooperative and appears stated age Head: Normocephalic, without obvious abnormality, atraumatic Neck: supple, symmetrical, trachea midline Resp: clear to auscultation bilaterally Cardio: regular rate and  rhythm GI: non-tender Extremities: Intact sensation and capillary refill all digits.  +epl/fpl/io.  Necrosis of right long finger distal phalanx. Pulses: 2+ and symmetric Skin: Skin color, texture, turgor normal. No rashes or lesions Neurologic: Grossly normal Incision/Wound:as above  Assessment/Plan Right long finger gangrene.  Plan amputation right long finger.  He wishes to preserve length if possible.  Non operative and operative treatment options were discussed with the patient and patient wishes to proceed with operative treatment. Risks, benefits, and alternatives of surgery were discussed and the patient agrees with the plan of care.   Matha Masse R 08/20/2016, 1:21 PM

## 2016-08-20 NOTE — Discharge Instructions (Addendum)

## 2016-08-20 NOTE — Anesthesia Postprocedure Evaluation (Signed)
Anesthesia Post Note  Patient: Jack [name]  Procedure(s) Performed: Procedure(s) (LRB): RIGHT LONG FINGER AMPUTATION (Right)     Patient location during evaluation: PACU Anesthesia Type: General Level of consciousness: awake and alert Pain management: pain level controlled Vital Signs Assessment: post-procedure vital signs reviewed and stable Respiratory status: spontaneous breathing, nonlabored ventilation, respiratory function stable and patient connected to nasal cannula oxygen Cardiovascular status: blood pressure returned to baseline and stable Postop Assessment: no signs of nausea or vomiting Anesthetic complications: no    Last Vitals:  Vitals:   08/20/16 1530 08/20/16 1600  BP: (!) 158/52 131/78  Pulse: (!) 56 (!) 54  Resp: 10 16  Temp:  36.5 C    Last Pain:  Vitals:   08/20/16 1600  TempSrc:   PainSc: 0-No pain                 Shaira Sova P Yakira Duquette

## 2016-08-20 NOTE — Brief Op Note (Signed)
08/20/2016  2:30 PM  PATIENT:  Jack [name]  71 y.o. male  PRE-OPERATIVE DIAGNOSIS:  right long finger necrosis  POST-OPERATIVE DIAGNOSIS:  right long finger necrosis  PROCEDURE:  Procedure(s): RIGHT LONG FINGER AMPUTATION (Right)  SURGEON:  Surgeon(s) and Role:    Leanora Cover, MD - Primary  PHYSICIAN ASSISTANT:   ASSISTANTS: none   ANESTHESIA:   general  EBL:  Total I/O In: 100 [I.V.:100] Out: -   BLOOD ADMINISTERED:none  DRAINS: none   LOCAL MEDICATIONS USED:  MARCAINE     SPECIMEN:  Source of Specimen:  right long finger for gross examination  DISPOSITION OF SPECIMEN:  PATHOLOGY  COUNTS:  YES  TOURNIQUET:   Total Tourniquet Time Documented: Upper Arm (Right) - 29 minutes Total: Upper Arm (Right) - 29 minutes   DICTATION: .Other Dictation: Dictation Number [id number]  PLAN OF CARE: Discharge to home after PACU  PATIENT DISPOSITION:  PACU - hemodynamically stable.

## 2016-08-20 NOTE — Op Note (Signed)
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  PROCEDURE:  Right long finger amputation through middle phalanx.  SURGEON:  Leanora Cover, MD.  ASSISTANT:  None.  ANESTHESIA:  General.  IV FLUIDS:  Per anesthesia flow sheet.  ESTIMATED BLOOD LOSS:  Minimal.  COMPLICATIONS:  None.  SPECIMENS:  Finger for gross examination to Pathology.  TOURNIQUET TIME:  29 minutes.  DISPOSITION:  Stable to PACU.  INDICATIONS:  [name] is a 71 year old male who had dry gangrene of the distal phalanx of the right long finger.  He wishes to have this amputated for management of the condition.  He wishes to maintain length.  Risks, benefits, and alternatives of the surgery were discussed including the risk of blood loss; infection; damage to nerves, vessels, tendons, ligaments, bone for surgery, need for additional surgery; complications with wound healing; continued pain, failure of wound healing, and need for further amputation.  He voiced understanding of these risks and elected to proceed.  DESCRIPTION OF PROCEDURE:  After being identified preoperatively by myself, the patient and I agreed upon the procedure and site procedure. Surgical site was marked.  The risks, benefits, and alternatives of surgery were reviewed and he wished to proceed.  Surgical consent had been signed.  He was given IV Ancef as preoperative antibiotic prophylaxis.  He was transported to the operating room and placed on the operating room table in supine position with the right upper extremity on arm board.   General anesthesia induced by anesthesiologist.  Right upper extremity was prepped and draped in normal sterile orthopedic fashion.  A surgical pause was performed between surgeons, anesthesia, and operating room staff; and all were in agreement as to the patient, procedure and site of procedure.  Tourniquet at the proximal aspect of the extremity was inflated to 250 mmHg after exsanguination of the limb with an Esmarch bandage.  A fishmouth type incision was made at the distal aspect of the middle phalanx.  This was carried into subcutaneous tissues by spreading technique.  The radial and ulnar neurovascular bundles were identified.  The nerves were placed under traction and treated with bipolar and allowed to retract.  The arteries were treated with bipolar as well.  They were also sewn with 4-0 Vicryl suture.  The finger was amputated through the DIP joint and sent to Pathology for examination.  The distal aspect of the middle phalanx was then removed as well.  This was done to allow better tension-free repair of the soft tissue edges.  The wound was copiously irrigated with sterile saline. The bone edges were smoothed with a rongeur.  A 5-0 Monocryl suture was used in a vertical mattress fashion to reapproximate skin edges.  Good  tension free repair was obtained.  The wound was dressed with sterile Xeroform, 4 x 4, and wrapped with a Coban dressing lightly.  A digital block was performed with 9 mL 0.5% plain Marcaine to aid in postoperative analgesia.  The tourniquet was deflated at 29 minutes. Fingertips were pink with brisk capillary refill after deflation of tourniquet.  The operative drapes were broken down.  The patient was awoken from anesthesia safely.  He was transferred back to stretcher and taken to PACU in stable condition.  I will see him back in the office in 1 week for postoperative followup.  We will give him Norco 5/325 one to two p.o. q.6 hours p.r.n. pain, dispensed  #30.     Leanora Cover, MD     KK/MEDQ  D:  08/20/2016  T:  08/20/2016  Job:  [id number]

## 2016-08-20 NOTE — Anesthesia Preprocedure Evaluation (Addendum)
Anesthesia Evaluation  Patient identified by MRN, date of birth, ID band Patient awake    Reviewed: Allergy & Precautions, NPO status , Patient's Chart, lab work & pertinent test results  Airway Mallampati: III  TM Distance: >3 FB Neck ROM: Full    Dental no notable dental hx. (+) Dental Advisory Given,    Pulmonary asthma , former smoker,    Pulmonary exam normal breath sounds clear to auscultation       Cardiovascular hypertension, + Peripheral Vascular Disease  Normal cardiovascular exam Rhythm:Regular Rate:Normal     Neuro/Psych negative neurological ROS  negative psych ROS   GI/Hepatic negative GI ROS, Neg liver ROS,   Endo/Other  diabetesHypothyroidism Hyperparathyroidism  Renal/GU ESRF and DialysisRenal disease  negative genitourinary   Musculoskeletal negative musculoskeletal ROS (+)   Abdominal   Peds negative pediatric ROS (+)  Hematology  (+) anemia ,   Anesthesia Other Findings Multiple myeloma. Decubitus ulcer. Joint contractures. Glaucoma  Reproductive/Obstetrics negative OB ROS                           Anesthesia Physical Anesthesia Plan  ASA: IV  Anesthesia Plan: General   Post-op Pain Management:    Induction: Intravenous  PONV Risk Score and Plan: 1 and Ondansetron, Dexamethasone and Treatment may vary due to age or medical condition  Airway Management Planned: LMA  Additional Equipment:   Intra-op Plan:   Post-operative Plan: Extubation in OR  Informed Consent: I have reviewed the patients History and Physical, chart, labs and discussed the procedure including the risks, benefits and alternatives for the proposed anesthesia with the patient or authorized representative who has indicated his/her understanding and acceptance.   Dental advisory given  Plan Discussed with: CRNA  Anesthesia Plan Comments:        Anesthesia Quick Evaluation  

## 2016-08-20 NOTE — Transfer of Care (Signed)
Immediate Anesthesia Transfer of Care Note  Patient: Jack [name]  Procedure(s) Performed: Procedure(s): RIGHT LONG FINGER AMPUTATION (Right)  Patient Location: PACU  Anesthesia Type:General  Level of Consciousness: sedated  Airway & Oxygen Therapy: Patient Spontanous Breathing and Patient connected to face mask oxygen  Post-op Assessment: Report given to RN and Post -op Vital signs reviewed and stable  Post vital signs: Reviewed and stable  Last Vitals:  Vitals:   08/20/16 1212  BP: (!) 145/80  Resp: 16  Temp: 37 C    Last Pain:  Vitals:   08/20/16 1212  TempSrc: Oral  PainSc: 8          Complications: No apparent anesthesia complications

## 2016-08-21 ENCOUNTER — Encounter (HOSPITAL_BASED_OUTPATIENT_CLINIC_OR_DEPARTMENT_OTHER): Payer: Self-pay | Admitting: Orthopedic Surgery

## 2016-08-21 DIAGNOSIS — D689 Coagulation defect, unspecified: Secondary | ICD-10-CM | POA: Diagnosis not present

## 2016-08-21 DIAGNOSIS — D631 Anemia in chronic kidney disease: Secondary | ICD-10-CM | POA: Diagnosis not present

## 2016-08-21 DIAGNOSIS — N2581 Secondary hyperparathyroidism of renal origin: Secondary | ICD-10-CM | POA: Diagnosis not present

## 2016-08-21 DIAGNOSIS — T82598A Other mechanical complication of other cardiac and vascular devices and implants, initial encounter: Secondary | ICD-10-CM | POA: Diagnosis not present

## 2016-08-21 DIAGNOSIS — R509 Fever, unspecified: Secondary | ICD-10-CM | POA: Diagnosis not present

## 2016-08-21 DIAGNOSIS — N186 End stage renal disease: Secondary | ICD-10-CM | POA: Diagnosis not present

## 2016-08-21 DIAGNOSIS — E1029 Type 1 diabetes mellitus with other diabetic kidney complication: Secondary | ICD-10-CM | POA: Diagnosis not present

## 2016-08-22 ENCOUNTER — Encounter: Payer: Medicare Other | Admitting: Physical Therapy

## 2016-08-22 ENCOUNTER — Encounter: Payer: Self-pay | Admitting: Vascular Surgery

## 2016-08-22 ENCOUNTER — Ambulatory Visit (INDEPENDENT_AMBULATORY_CARE_PROVIDER_SITE_OTHER): Payer: Medicare Other | Admitting: Vascular Surgery

## 2016-08-22 VITALS — BP 157/101 | HR 78 | Temp 97.5°F | Resp 18 | Ht 70.0 in | Wt 163.0 lb

## 2016-08-22 DIAGNOSIS — N186 End stage renal disease: Secondary | ICD-10-CM

## 2016-08-22 DIAGNOSIS — Z992 Dependence on renal dialysis: Secondary | ICD-10-CM

## 2016-08-22 NOTE — Progress Notes (Signed)
Patient is a 71 year old male who is seen approximate 1 month ago for placement of a new hemodialysis access. He has had a prior left brachiocephalic AV fistula which has now failed. He is currently dialyzing via a catheter. Were waiting for his right third finger to heal from a burn. Unfortunately the wound progressed and he had an amputation of the right third finger by Dr. Fredna Dow recently. I reviewed the patient's vein mapping today and he would be a candidate for a right brachiocephalic or basilic vein transposition fistula. His dialysis today is Monday Wednesday Friday.  Review of systems: He denies fever or chills. He denies shortness of breath. He denies any chest pain.  Vitals:   08/22/16 1247  BP: (!) 157/101  Pulse: 78  Resp: 18  Temp: (!) 97.5 F (36.4 C)  TempSrc: Oral  SpO2: 100%  Weight: 163 lb (73.9 kg)  Height: 5\' 10"  (1.778 m)    Extremities: Healing incision distal tip right third finger amputation site  Assessment: Patient with recent amputation tip of right third finger. He is a candidate for right arm AV fistula. However, the wound in his right hand needs to be completely healed prior to placing the fistula is otherwise he would be at risk of wound breakdown. The patient will follow-up with me in 6 weeks to see if his hand is healed and is ready for a fistula that point.  [name], MD [facility] Office: [phone] Pager: [phone]

## 2016-08-23 DIAGNOSIS — T82598A Other mechanical complication of other cardiac and vascular devices and implants, initial encounter: Secondary | ICD-10-CM | POA: Diagnosis not present

## 2016-08-23 DIAGNOSIS — D631 Anemia in chronic kidney disease: Secondary | ICD-10-CM | POA: Diagnosis not present

## 2016-08-23 DIAGNOSIS — N2581 Secondary hyperparathyroidism of renal origin: Secondary | ICD-10-CM | POA: Diagnosis not present

## 2016-08-23 DIAGNOSIS — R509 Fever, unspecified: Secondary | ICD-10-CM | POA: Diagnosis not present

## 2016-08-23 DIAGNOSIS — D689 Coagulation defect, unspecified: Secondary | ICD-10-CM | POA: Diagnosis not present

## 2016-08-23 DIAGNOSIS — E1029 Type 1 diabetes mellitus with other diabetic kidney complication: Secondary | ICD-10-CM | POA: Diagnosis not present

## 2016-08-23 DIAGNOSIS — N186 End stage renal disease: Secondary | ICD-10-CM | POA: Diagnosis not present

## 2016-08-26 DIAGNOSIS — T82598A Other mechanical complication of other cardiac and vascular devices and implants, initial encounter: Secondary | ICD-10-CM | POA: Diagnosis not present

## 2016-08-26 DIAGNOSIS — R509 Fever, unspecified: Secondary | ICD-10-CM | POA: Diagnosis not present

## 2016-08-26 DIAGNOSIS — D631 Anemia in chronic kidney disease: Secondary | ICD-10-CM | POA: Diagnosis not present

## 2016-08-26 DIAGNOSIS — E1029 Type 1 diabetes mellitus with other diabetic kidney complication: Secondary | ICD-10-CM | POA: Diagnosis not present

## 2016-08-26 DIAGNOSIS — D689 Coagulation defect, unspecified: Secondary | ICD-10-CM | POA: Diagnosis not present

## 2016-08-26 DIAGNOSIS — N2581 Secondary hyperparathyroidism of renal origin: Secondary | ICD-10-CM | POA: Diagnosis not present

## 2016-08-26 DIAGNOSIS — N186 End stage renal disease: Secondary | ICD-10-CM | POA: Diagnosis not present

## 2016-08-27 ENCOUNTER — Ambulatory Visit: Payer: Medicare Other | Admitting: Physical Therapy

## 2016-08-28 DIAGNOSIS — E1029 Type 1 diabetes mellitus with other diabetic kidney complication: Secondary | ICD-10-CM | POA: Diagnosis not present

## 2016-08-28 DIAGNOSIS — N186 End stage renal disease: Secondary | ICD-10-CM | POA: Diagnosis not present

## 2016-08-28 DIAGNOSIS — N2581 Secondary hyperparathyroidism of renal origin: Secondary | ICD-10-CM | POA: Diagnosis not present

## 2016-08-28 DIAGNOSIS — R509 Fever, unspecified: Secondary | ICD-10-CM | POA: Diagnosis not present

## 2016-08-28 DIAGNOSIS — D631 Anemia in chronic kidney disease: Secondary | ICD-10-CM | POA: Diagnosis not present

## 2016-08-28 DIAGNOSIS — T82598A Other mechanical complication of other cardiac and vascular devices and implants, initial encounter: Secondary | ICD-10-CM | POA: Diagnosis not present

## 2016-08-28 DIAGNOSIS — D689 Coagulation defect, unspecified: Secondary | ICD-10-CM | POA: Diagnosis not present

## 2016-08-29 ENCOUNTER — Ambulatory Visit: Payer: Medicare Other | Admitting: Physical Therapy

## 2016-08-30 DIAGNOSIS — E1029 Type 1 diabetes mellitus with other diabetic kidney complication: Secondary | ICD-10-CM | POA: Diagnosis not present

## 2016-08-30 DIAGNOSIS — N2581 Secondary hyperparathyroidism of renal origin: Secondary | ICD-10-CM | POA: Diagnosis not present

## 2016-08-30 DIAGNOSIS — D689 Coagulation defect, unspecified: Secondary | ICD-10-CM | POA: Diagnosis not present

## 2016-08-30 DIAGNOSIS — N186 End stage renal disease: Secondary | ICD-10-CM | POA: Diagnosis not present

## 2016-08-30 DIAGNOSIS — T82598A Other mechanical complication of other cardiac and vascular devices and implants, initial encounter: Secondary | ICD-10-CM | POA: Diagnosis not present

## 2016-08-30 DIAGNOSIS — D631 Anemia in chronic kidney disease: Secondary | ICD-10-CM | POA: Diagnosis not present

## 2016-08-30 DIAGNOSIS — R509 Fever, unspecified: Secondary | ICD-10-CM | POA: Diagnosis not present

## 2016-09-02 DIAGNOSIS — E1029 Type 1 diabetes mellitus with other diabetic kidney complication: Secondary | ICD-10-CM | POA: Diagnosis not present

## 2016-09-02 DIAGNOSIS — N2581 Secondary hyperparathyroidism of renal origin: Secondary | ICD-10-CM | POA: Diagnosis not present

## 2016-09-02 DIAGNOSIS — R509 Fever, unspecified: Secondary | ICD-10-CM | POA: Diagnosis not present

## 2016-09-02 DIAGNOSIS — T82598A Other mechanical complication of other cardiac and vascular devices and implants, initial encounter: Secondary | ICD-10-CM | POA: Diagnosis not present

## 2016-09-02 DIAGNOSIS — D689 Coagulation defect, unspecified: Secondary | ICD-10-CM | POA: Diagnosis not present

## 2016-09-02 DIAGNOSIS — N186 End stage renal disease: Secondary | ICD-10-CM | POA: Diagnosis not present

## 2016-09-02 DIAGNOSIS — D631 Anemia in chronic kidney disease: Secondary | ICD-10-CM | POA: Diagnosis not present

## 2016-09-03 ENCOUNTER — Telehealth: Payer: Self-pay | Admitting: Family Medicine

## 2016-09-03 ENCOUNTER — Encounter: Payer: Medicare Other | Admitting: Physical Therapy

## 2016-09-03 MED ORDER — GLUCOSE BLOOD VI STRP: ORAL_STRIP | 3 refills | Status: DC

## 2016-09-03 NOTE — Telephone Encounter (Signed)
Rx sent with updated directions 

## 2016-09-03 NOTE — Telephone Encounter (Signed)
° ° ° °  Pt request refill of the following:  Test strips for one touch verio   Pt said he test twice a day so there fore he need a new RX    Phamacy:  [place]

## 2016-09-04 DIAGNOSIS — D631 Anemia in chronic kidney disease: Secondary | ICD-10-CM | POA: Diagnosis not present

## 2016-09-04 DIAGNOSIS — E1029 Type 1 diabetes mellitus with other diabetic kidney complication: Secondary | ICD-10-CM | POA: Diagnosis not present

## 2016-09-04 DIAGNOSIS — T82598A Other mechanical complication of other cardiac and vascular devices and implants, initial encounter: Secondary | ICD-10-CM | POA: Diagnosis not present

## 2016-09-04 DIAGNOSIS — N186 End stage renal disease: Secondary | ICD-10-CM | POA: Diagnosis not present

## 2016-09-04 DIAGNOSIS — N2581 Secondary hyperparathyroidism of renal origin: Secondary | ICD-10-CM | POA: Diagnosis not present

## 2016-09-04 DIAGNOSIS — D689 Coagulation defect, unspecified: Secondary | ICD-10-CM | POA: Diagnosis not present

## 2016-09-04 DIAGNOSIS — R509 Fever, unspecified: Secondary | ICD-10-CM | POA: Diagnosis not present

## 2016-09-05 ENCOUNTER — Encounter: Payer: Medicare Other | Admitting: Physical Therapy

## 2016-09-05 NOTE — Addendum Note (Signed)
Addendum  created 09/05/16 0949 by Roberts Gaudy, MD   Sign clinical note

## 2016-09-06 DIAGNOSIS — E1029 Type 1 diabetes mellitus with other diabetic kidney complication: Secondary | ICD-10-CM | POA: Diagnosis not present

## 2016-09-06 DIAGNOSIS — D631 Anemia in chronic kidney disease: Secondary | ICD-10-CM | POA: Diagnosis not present

## 2016-09-06 DIAGNOSIS — N2581 Secondary hyperparathyroidism of renal origin: Secondary | ICD-10-CM | POA: Diagnosis not present

## 2016-09-06 DIAGNOSIS — N186 End stage renal disease: Secondary | ICD-10-CM | POA: Diagnosis not present

## 2016-09-06 DIAGNOSIS — R509 Fever, unspecified: Secondary | ICD-10-CM | POA: Diagnosis not present

## 2016-09-06 DIAGNOSIS — D689 Coagulation defect, unspecified: Secondary | ICD-10-CM | POA: Diagnosis not present

## 2016-09-06 DIAGNOSIS — T82598A Other mechanical complication of other cardiac and vascular devices and implants, initial encounter: Secondary | ICD-10-CM | POA: Diagnosis not present

## 2016-09-09 DIAGNOSIS — D631 Anemia in chronic kidney disease: Secondary | ICD-10-CM | POA: Diagnosis not present

## 2016-09-09 DIAGNOSIS — R509 Fever, unspecified: Secondary | ICD-10-CM | POA: Diagnosis not present

## 2016-09-09 DIAGNOSIS — T82598A Other mechanical complication of other cardiac and vascular devices and implants, initial encounter: Secondary | ICD-10-CM | POA: Diagnosis not present

## 2016-09-09 DIAGNOSIS — E1029 Type 1 diabetes mellitus with other diabetic kidney complication: Secondary | ICD-10-CM | POA: Diagnosis not present

## 2016-09-09 DIAGNOSIS — N2581 Secondary hyperparathyroidism of renal origin: Secondary | ICD-10-CM | POA: Diagnosis not present

## 2016-09-09 DIAGNOSIS — N186 End stage renal disease: Secondary | ICD-10-CM | POA: Diagnosis not present

## 2016-09-09 DIAGNOSIS — D689 Coagulation defect, unspecified: Secondary | ICD-10-CM | POA: Diagnosis not present

## 2016-09-10 ENCOUNTER — Encounter: Payer: Medicare Other | Admitting: Physical Therapy

## 2016-09-11 ENCOUNTER — Other Ambulatory Visit: Payer: Self-pay | Admitting: Family Medicine

## 2016-09-11 DIAGNOSIS — E1029 Type 1 diabetes mellitus with other diabetic kidney complication: Secondary | ICD-10-CM | POA: Diagnosis not present

## 2016-09-11 DIAGNOSIS — R509 Fever, unspecified: Secondary | ICD-10-CM | POA: Diagnosis not present

## 2016-09-11 DIAGNOSIS — T82598A Other mechanical complication of other cardiac and vascular devices and implants, initial encounter: Secondary | ICD-10-CM | POA: Diagnosis not present

## 2016-09-11 DIAGNOSIS — D631 Anemia in chronic kidney disease: Secondary | ICD-10-CM | POA: Diagnosis not present

## 2016-09-11 DIAGNOSIS — N2581 Secondary hyperparathyroidism of renal origin: Secondary | ICD-10-CM | POA: Diagnosis not present

## 2016-09-11 DIAGNOSIS — D689 Coagulation defect, unspecified: Secondary | ICD-10-CM | POA: Diagnosis not present

## 2016-09-11 DIAGNOSIS — N186 End stage renal disease: Secondary | ICD-10-CM | POA: Diagnosis not present

## 2016-09-12 ENCOUNTER — Ambulatory Visit: Payer: Medicare Other | Admitting: Physical Therapy

## 2016-09-12 ENCOUNTER — Encounter: Payer: Self-pay | Admitting: Physical Therapy

## 2016-09-12 ENCOUNTER — Telehealth (INDEPENDENT_AMBULATORY_CARE_PROVIDER_SITE_OTHER): Payer: Self-pay | Admitting: Orthopedic Surgery

## 2016-09-12 DIAGNOSIS — M6281 Muscle weakness (generalized): Secondary | ICD-10-CM

## 2016-09-12 DIAGNOSIS — R2689 Other abnormalities of gait and mobility: Secondary | ICD-10-CM

## 2016-09-12 DIAGNOSIS — R2681 Unsteadiness on feet: Secondary | ICD-10-CM | POA: Diagnosis not present

## 2016-09-12 DIAGNOSIS — M6249 Contracture of muscle, multiple sites: Secondary | ICD-10-CM | POA: Diagnosis not present

## 2016-09-12 NOTE — Telephone Encounter (Signed)
08/05/2016 ov note faxed to [place]

## 2016-09-12 NOTE — Therapy (Signed)
[facility] [address] [place], [place], [address] Phone: [phone]   Fax:  [phone]  Physical Therapy Treatment  Patient Details  Name: Jack [name] MRN: [id number] Date of Birth: 1945/12/29 Referring Provider: Meridee Score MD   Encounter Date: 09/12/2016      PT End of Session - 09/12/16 1953    Visit Number 12   Number of Visits 24   Date for PT Re-Evaluation 09/20/16   Authorization Type Medicare & G-codes every 10th visit    PT Start Time 0800   PT Stop Time 0845   PT Time Calculation (min) 45 min   Equipment Utilized During Treatment Gait belt   Activity Tolerance Patient tolerated treatment well   Behavior During Therapy [facility] for tasks assessed/performed      Past Medical History:  Diagnosis Date  . Anemia   . Asthma   . Contracture of joint    left knee  . Decubitus ulcer of sacral region, stage 2 03/07/2014  . Diabetes mellitus with peripheral vascular disease ([place])   . End-stage renal disease on hemodialysis [facility])    Started HD March 2014.  Cause of ESRD was DM.  Gets HD at [facility] on [address] on MWF schedule.   Marland Kitchen ESRD on hemodialysis ([place])   . Gangrene ([place])    right BKA  . Gangrene of foot ([place])   . Glaucoma   . Glaucoma 03/07/2014  . Heart murmur   . History of MRSA infection 04/22/2013   Bacteremia assoc w L foot wound infection Mar 2015   . Hyperparathyroidism, secondary renal ([place])   . Hypertension   . Hypothyroidism   . MRSA bacteremia   . Multiple myeloma, without mention of having achieved remission 03/30/2012   Cytogenetic neg on 03/23/2012.  Marland Kitchen Peripheral arterial disease ([place])   . Peripheral vascular disease, unspecified ([place]) 11/19/2012   In the past had R foot toe amps then R TMA. In 2015 had left foot toe amp > then TMA >then L BKA on 05/14/13   . Pneumonia    2012  . Thyroid disease    hyperparathyroidism    Past Surgical History:  Procedure Laterality Date   . ABDOMINAL AORTAGRAM Bilateral 11/06/2012   Procedure: ABDOMINAL AORTAGRAM;  Surgeon: Elam Dutch, MD;  Location: [facility] CATH LAB;  Service: Cardiovascular;  Laterality: Bilateral;  . AMPUTATION Right 11/10/2012   Procedure: AMPUTATION FIRST and SECOND TOES Right Foot;  Surgeon: Elam Dutch, MD;  Location: [facility];  Service: Vascular;  Laterality: Right;  . AMPUTATION Left 04/07/2013   Procedure: AMPUTATION DIGIT- LEFT 1ST TOE;  Surgeon: Mal Misty, MD;  Location: [place];  Service: Vascular;  Laterality: Left;  . AMPUTATION Left 04/26/2013   Procedure: Left Foot Transmetatarsal Amputation;  Surgeon: Newt Minion, MD;  Location: [place];  Service: Orthopedics;  Laterality: Left;  . AMPUTATION Left 05/14/2013   Procedure: AMPUTATION BELOW KNEE;  Surgeon: Newt Minion, MD;  Location: [facility];  Service: Orthopedics;  Laterality: Left;  Left Below Knee Amputation  . AMPUTATION Left 07/23/2013   Procedure: AMPUTATION BELOW KNEE;  Surgeon: Newt Minion, MD;  Location: [place];  Service: Orthopedics;  Laterality: Left;  Left Below Knee Amputation Revision  . AMPUTATION Right 02/18/2014   Procedure: AMPUTATION BELOW KNEE;  Surgeon: Newt Minion, MD;  Location: [facility];  Service: Orthopedics;  Laterality: Right;  . AMPUTATION Right 08/20/2016   Procedure: RIGHT LONG FINGER AMPUTATION;  Surgeon: Leanora Cover, MD;  Location: [facility];  Service: Orthopedics;  Laterality: Right;  . AV FISTULA PLACEMENT Left 03/25/2012   Procedure: ARTERIOVENOUS (AV) FISTULA CREATION;  Surgeon: Mal Misty, MD;  Location: [facility];  Service: Vascular;  Laterality: Left;  . BELOW KNEE LEG AMPUTATION Left 05/14/2013   DR [name]  . BELOW KNEE LEG AMPUTATION Right 02/18/2014   dr duda  . CARDIAC CATHETERIZATION     approx 30 years ago  . CERVICAL DISC SURGERY    . EYE SURGERY Bilateral    CATARACTS  . I&D EXTREMITY Left 04/22/2013   Procedure: IRRIGATION AND DEBRIDEMENT LEFT FIRST TOE AMPUTATION WOUND ;  Surgeon:  Mal Misty, MD;  Location: [facility];  Service: Vascular;  Laterality: Left;  . I&D EXTREMITY Right 01/05/2014   Procedure: IRRIGATION AND DEBRIDEMENT Right Heel Ulcer;  Surgeon: Mcarthur Rossetti, MD;  Location: [facility];  Service: Orthopedics;  Laterality: Right;  Surgeon Available after 5PM  . INSERTION OF DIALYSIS CATHETER Right 03/19/2012   Procedure: INSERTION OF DIALYSIS CATHETER;  Surgeon: Mal Misty, MD;  Location: [facility];  Service: Vascular;  Laterality: Right;  Right Internal Jugular  . LIGATION OF COMPETING BRANCHES OF ARTERIOVENOUS FISTULA Left 05/08/2012   Procedure: LIGATION OF COMPETING BRANCHES OF ARTERIOVENOUS FISTULA;  Surgeon: Mal Misty, MD;  Location: [facility];  Service: Vascular;  Laterality: Left;  Ultrasound guided  . REPAIR QUADRICEPS/HAMSTRING MUSCLES Left 05/20/2014   Procedure: Left Hamstring Release;  Surgeon: Newt Minion, MD;  Location: [facility];  Service: Orthopedics;  Laterality: Left;  . REVISON OF ARTERIOVENOUS FISTULA Left 01/30/2016   Procedure: CEPHALIC VEIN TURNDOWN TO LEFT UPPER ARM;  Surgeon: Elam Dutch, MD;  Location: [place];  Service: Vascular;  Laterality: Left;  . STUMP REVISION Right 05/20/2014   Procedure: Revision Right Below Knee Amputation;  Surgeon: Newt Minion, MD;  Location: [place];  Service: Orthopedics;  Laterality: Right;  . TEE WITHOUT CARDIOVERSION N/A 04/20/2013   Procedure: TRANSESOPHAGEAL ECHOCARDIOGRAM (TEE);  Surgeon: Josue Hector, MD;  [facility];  Service: Cardiovascular;  Laterality: N/A;  . THYROIDECTOMY    . TOE AMPUTATION     D/C 04-30-13  . TRANSMETATARSAL AMPUTATION Left 12/16/2012   Procedure: TRANSMETATARSAL AMPUTATION AND VAC PLACEMENT;  Surgeon: Elam Dutch, MD;  Location: [facility];  Service: Vascular;  Laterality: Left;    There were no vitals filed for this visit.      Subjective Assessment - 09/12/16 0800    Subjective He had right (dominant) hand distal middle phalange amputated on 08/20/2016. It is  healing but he developed a blister on medial MCP from bandage too tight. He reports MD authorized weight thru his hand but not on finger.    Patient is accompained by: Family member   Pertinent History bilateral transtibial amputations, anemia, asthma, contracture of L knee, decubitus ulcer of sacral region, DM with PVD, ESRD, glaucoma, heart murmur, HTN, hypothyroidism, multiple myeloma, PAD   Limitations Lifting;Standing;Walking;House hold activities   Patient Stated Goals walk without a person assisting him (walk with device only)    Currently in Pain? Yes   Pain Score 5    Pain Location Hand  esp. middle finger   Pain Orientation Right   Pain Descriptors / Indicators Aching   Pain Type Acute pain   Pain Onset 1 to 4 weeks ago   Aggravating Factors  touching, pressure   Pain Relieving Factors not dependent, rested on  support surface without pressure on hand   Effect of Pain on Daily Activities difficulty using right hand with ADLs                         [facility] Adult PT Treatment/Exercise - 09/12/16 0800      Transfers   Transfers Sit to Stand;Stand to Sit   Sit to Stand 5: Supervision;With upper extremity assist;With armrests;From chair/3-in-1  to RW   Stand to Sit 5: Supervision;With upper extremity assist;With armrests;To chair/3-in-1  from RW     Ambulation/Gait   Ambulation/Gait Yes   Ambulation/Gait Assistance 4: Min guard   Ambulation/Gait Assistance Details PT instructed to use std RW & not rollator walker until he can use right hand to squeeze brake.    Ambulation Distance (Feet) 90 Feet  90' X 3   Assistive device Rolling walker;Prostheses;Other (Comment)  right hand orthosis   Gait Pattern Step-through pattern;Left flexed knee in stance;Right flexed knee in stance;Wide base of support;Poor foot clearance - left;Poor foot clearance - right;Trunk flexed   Ambulation Surface Indoor;Level     Prosthetics   Prosthetic Care Comments  continue daily wear.     Current prosthetic wear tolerance (days/week)  daily    Current prosthetic wear tolerance (#hours/day)  all awake hours every day   Education Provided Proper wear schedule/adjustment   Person(s) Educated Patient;Child(ren)   Education Method Explanation   Education Method Verbalized understanding                PT Education - 09/12/16 1947    Education provided Yes   Education Details use of walker orthosis for hand grip and use of foam build up for utensils, pen, toothbrush, etc.    Person(s) Educated Patient;Child(ren)   Methods Explanation;Demonstration;Verbal cues   Comprehension Verbalized understanding          PT Short Term Goals - 08/01/16 1924      PT SHORT TERM GOAL #1   Title Patient will verbalize understanding and return demonstration of updated HEP. Target 08/23/2016     Time 4   Period Weeks   Status Revised     PT SHORT TERM GOAL #2   Title Patient will demonstrate ability to reach 5 inches anteriorly and reach within 10 inches from the floor unilateral UE support on RW with supervision to indicate a decrease in his risk of falling.  Target 08/23/2016     Time 4   Period Weeks   Status On-going     PT SHORT TERM GOAL #3   Title TUG with rollator walker <40 seconds with supervision. Target 08/23/2016     Time 4   Period Weeks   Status New     PT SHORT TERM GOAL #4   Title Patient will demonstrate ability to ambulate 150 feet on indoor surfaces with rollator style walker and supervision to indicate a decrease in his risk of falling.   Target 08/23/2016     Time 4   Period Weeks   Status New           PT Long Term Goals - 08/01/16 1928      PT LONG TERM GOAL #1   Title Patient will verbalize understanding and return demonstration of ongoing HEP program. (TARGET DATE: 09/23/2016)    Time 2   Period Months   Status On-going     PT LONG TERM GOAL #2   Title Patient will ambulate 200 feet on firm,  paved outdoor surfaces with family support  to indicate a decrease risk of falling when returning to community ambulation with family support.   (TARGET DATE: 09/23/2016)    Time 2   Period Months   Status Revised     PT LONG TERM GOAL #3   Title Patient ambulates 61' with standard RW and prostheses around furniture modified independent.   (TARGET DATE: 09/23/2016)    Time 2   Period Months   Status New     PT LONG TERM GOAL #4   Title Patient will be able to ambulate over ramp/curb with rollator style walker with family assistance to indicate a decrease in his risk of falling when returning to community ambulation with family.   (TARGET DATE: 09/23/2016)    Time 2   Period Months   Status On-going     PT LONG TERM GOAL #5   Title Patient will demonstrate ability to reach 8 inches anteriorly and reach within 8 inches from the floor unilateral UE support on RW with supervision to indicate a decrease in his risk of falling.   (TARGET DATE: 09/23/2016)    Time 2   Period Months   Status On-going               Plan - 09/12/16 1955    Clinical Impression Statement Patient was able to ambulate with hand orthosis on RW to limit grip. His pain did not change from sitting with no pressure to ambulate with hand orthosis. Use of rollator walker is not save as unable to squeeze brake. Patient will need recertification next week due to missed appointments with finger amputation.    Rehab Potential Good   Clinical Impairments Affecting Rehab Potential bilateral transtibial amputations, anemia, asthma, contracture of L knee, decubitus ulcer of sacral region, DM with PVD, ESRD, glaucoma, heart murmur, HTN, hypothyroidism, multiple myeloma, PAD   PT Frequency 2x / week   PT Duration 8 weeks   PT Treatment/Interventions ADLs/Self Care Home Management;Neuromuscular re-education;Balance training;Therapeutic exercise;Therapeutic activities;Functional mobility training;Stair training;Gait training;DME Instruction;Patient/family  education;Prosthetic Training;Manual techniques;Scar mobilization;Passive range of motion   PT Next Visit Plan assess STG & LTG and recertify PT.    Consulted and Agree with Plan of Care Patient;Family member/caregiver   Family Member Consulted dtr-Cory      Patient will benefit from skilled therapeutic intervention in order to improve the following deficits and impairments:  Abnormal gait, Decreased activity tolerance, Decreased balance, Decreased coordination, Decreased range of motion, Decreased safety awareness, Decreased knowledge of use of DME, Decreased knowledge of precautions, Decreased endurance, Decreased strength, Difficulty walking, Prosthetic Dependency, Postural dysfunction  Visit Diagnosis: Muscle weakness (generalized)  Unsteadiness on feet  Other abnormalities of gait and mobility  Contracture of muscle, multiple sites     Problem List Patient Active Problem List   Diagnosis Date Noted  . Dyslipidemia 11/21/2015  . Diabetic retinopathy ([place])- left eye 10/02/2015  . Complications, amputation stump late ([place]) 05/20/2014  . Weakness generalized 04/07/2014  . Sepsis ([place]) 04/07/2014  . Decubitus ulcer, stage II 04/07/2014  . Decubitus ulcer of ankle   . Fatigue   . Wound infection 04/06/2014  . Decubitus ulcer of sacral region, stage 2 03/07/2014  . Glaucoma 03/07/2014  . Gout 03/07/2014  . Below knee amputation status ([place]) 02/18/2014  . Osteomyelitis ([place]) 01/04/2014  . Phantom limb ([place]) 12/14/2013  . Type 2 diabetes mellitus with diabetic foot infection ([place])   . Diabetes mellitus with peripheral vascular disease ([place])   .  Asthma 05/17/2013  . S/P bilateral below knee amputation ([place]) 05/17/2013  . History of MRSA infection 04/22/2013  . Peripheral vascular disease ([place]) 11/19/2012  . End-stage renal disease on hemodialysis ([place]) 05/05/2012  . Kahler disease ([place]) 03/30/2012  . Anemia in chronic kidney disease 03/19/2012  . Hypothyroidism following  radioiodine therapy 03/19/2012  . Anemia, iron deficiency 03/19/2012  . Hypertension 03/18/2012  . Chronic kidney disease (CKD), stage V ([place]) 03/18/2012  . Cardiac conduction disorder 03/18/2012  . MGUS (monoclonal gammopathy of unknown significance) 02/28/2011  . Monoclonal paraproteinemia 02/28/2011    Jamey Reas PT, DPT 09/12/2016, 7:59 PM  [facility] [address] [place], [place], [address] Phone: [phone]   Fax:  [phone]  Name: Jack [name] MRN: [id number] Date of Birth: 1945-02-23

## 2016-09-13 ENCOUNTER — Encounter: Payer: Medicare Other | Admitting: Physical Therapy

## 2016-09-13 DIAGNOSIS — C9000 Multiple myeloma not having achieved remission: Secondary | ICD-10-CM | POA: Diagnosis not present

## 2016-09-13 DIAGNOSIS — D631 Anemia in chronic kidney disease: Secondary | ICD-10-CM | POA: Diagnosis not present

## 2016-09-13 DIAGNOSIS — T82598A Other mechanical complication of other cardiac and vascular devices and implants, initial encounter: Secondary | ICD-10-CM | POA: Diagnosis not present

## 2016-09-13 DIAGNOSIS — D689 Coagulation defect, unspecified: Secondary | ICD-10-CM | POA: Diagnosis not present

## 2016-09-13 DIAGNOSIS — E1029 Type 1 diabetes mellitus with other diabetic kidney complication: Secondary | ICD-10-CM | POA: Diagnosis not present

## 2016-09-13 DIAGNOSIS — N2581 Secondary hyperparathyroidism of renal origin: Secondary | ICD-10-CM | POA: Diagnosis not present

## 2016-09-13 DIAGNOSIS — Z992 Dependence on renal dialysis: Secondary | ICD-10-CM | POA: Diagnosis not present

## 2016-09-13 DIAGNOSIS — N186 End stage renal disease: Secondary | ICD-10-CM | POA: Diagnosis not present

## 2016-09-13 DIAGNOSIS — R509 Fever, unspecified: Secondary | ICD-10-CM | POA: Diagnosis not present

## 2016-09-16 DIAGNOSIS — E1029 Type 1 diabetes mellitus with other diabetic kidney complication: Secondary | ICD-10-CM | POA: Diagnosis not present

## 2016-09-16 DIAGNOSIS — N2581 Secondary hyperparathyroidism of renal origin: Secondary | ICD-10-CM | POA: Diagnosis not present

## 2016-09-16 DIAGNOSIS — T82598A Other mechanical complication of other cardiac and vascular devices and implants, initial encounter: Secondary | ICD-10-CM | POA: Diagnosis not present

## 2016-09-16 DIAGNOSIS — R509 Fever, unspecified: Secondary | ICD-10-CM | POA: Diagnosis not present

## 2016-09-16 DIAGNOSIS — N186 End stage renal disease: Secondary | ICD-10-CM | POA: Diagnosis not present

## 2016-09-16 DIAGNOSIS — D689 Coagulation defect, unspecified: Secondary | ICD-10-CM | POA: Diagnosis not present

## 2016-09-16 DIAGNOSIS — D631 Anemia in chronic kidney disease: Secondary | ICD-10-CM | POA: Diagnosis not present

## 2016-09-18 DIAGNOSIS — E1029 Type 1 diabetes mellitus with other diabetic kidney complication: Secondary | ICD-10-CM | POA: Diagnosis not present

## 2016-09-18 DIAGNOSIS — D689 Coagulation defect, unspecified: Secondary | ICD-10-CM | POA: Diagnosis not present

## 2016-09-18 DIAGNOSIS — T82598A Other mechanical complication of other cardiac and vascular devices and implants, initial encounter: Secondary | ICD-10-CM | POA: Diagnosis not present

## 2016-09-18 DIAGNOSIS — D631 Anemia in chronic kidney disease: Secondary | ICD-10-CM | POA: Diagnosis not present

## 2016-09-18 DIAGNOSIS — N186 End stage renal disease: Secondary | ICD-10-CM | POA: Diagnosis not present

## 2016-09-18 DIAGNOSIS — N2581 Secondary hyperparathyroidism of renal origin: Secondary | ICD-10-CM | POA: Diagnosis not present

## 2016-09-18 DIAGNOSIS — R509 Fever, unspecified: Secondary | ICD-10-CM | POA: Diagnosis not present

## 2016-09-20 DIAGNOSIS — R509 Fever, unspecified: Secondary | ICD-10-CM | POA: Diagnosis not present

## 2016-09-20 DIAGNOSIS — D689 Coagulation defect, unspecified: Secondary | ICD-10-CM | POA: Diagnosis not present

## 2016-09-20 DIAGNOSIS — N2581 Secondary hyperparathyroidism of renal origin: Secondary | ICD-10-CM | POA: Diagnosis not present

## 2016-09-20 DIAGNOSIS — T82598A Other mechanical complication of other cardiac and vascular devices and implants, initial encounter: Secondary | ICD-10-CM | POA: Diagnosis not present

## 2016-09-20 DIAGNOSIS — D631 Anemia in chronic kidney disease: Secondary | ICD-10-CM | POA: Diagnosis not present

## 2016-09-20 DIAGNOSIS — E1029 Type 1 diabetes mellitus with other diabetic kidney complication: Secondary | ICD-10-CM | POA: Diagnosis not present

## 2016-09-20 DIAGNOSIS — N186 End stage renal disease: Secondary | ICD-10-CM | POA: Diagnosis not present

## 2016-09-23 DIAGNOSIS — E1029 Type 1 diabetes mellitus with other diabetic kidney complication: Secondary | ICD-10-CM | POA: Diagnosis not present

## 2016-09-23 DIAGNOSIS — N186 End stage renal disease: Secondary | ICD-10-CM | POA: Diagnosis not present

## 2016-09-23 DIAGNOSIS — N2581 Secondary hyperparathyroidism of renal origin: Secondary | ICD-10-CM | POA: Diagnosis not present

## 2016-09-23 DIAGNOSIS — R509 Fever, unspecified: Secondary | ICD-10-CM | POA: Diagnosis not present

## 2016-09-23 DIAGNOSIS — D631 Anemia in chronic kidney disease: Secondary | ICD-10-CM | POA: Diagnosis not present

## 2016-09-23 DIAGNOSIS — D689 Coagulation defect, unspecified: Secondary | ICD-10-CM | POA: Diagnosis not present

## 2016-09-23 DIAGNOSIS — T82598A Other mechanical complication of other cardiac and vascular devices and implants, initial encounter: Secondary | ICD-10-CM | POA: Diagnosis not present

## 2016-09-24 ENCOUNTER — Encounter: Payer: Self-pay | Admitting: Physical Therapy

## 2016-09-24 ENCOUNTER — Ambulatory Visit: Payer: Medicare Other | Attending: Orthopedic Surgery | Admitting: Physical Therapy

## 2016-09-24 DIAGNOSIS — M25641 Stiffness of right hand, not elsewhere classified: Secondary | ICD-10-CM | POA: Insufficient documentation

## 2016-09-24 DIAGNOSIS — R2689 Other abnormalities of gait and mobility: Secondary | ICD-10-CM | POA: Insufficient documentation

## 2016-09-24 DIAGNOSIS — R278 Other lack of coordination: Secondary | ICD-10-CM | POA: Diagnosis not present

## 2016-09-24 DIAGNOSIS — R2681 Unsteadiness on feet: Secondary | ICD-10-CM

## 2016-09-24 DIAGNOSIS — M6249 Contracture of muscle, multiple sites: Secondary | ICD-10-CM

## 2016-09-24 DIAGNOSIS — M6281 Muscle weakness (generalized): Secondary | ICD-10-CM | POA: Diagnosis not present

## 2016-09-24 NOTE — Therapy (Addendum)
[facility] [address] [place], [place], [address] Phone: [phone]   Fax:  [phone]  Physical Therapy Treatment  Patient Details  Name: Jack [name] MRN: [id number] Date of Birth: Jun 03, 1945 Referring Provider: Meridee Score MD   Encounter Date: 09/24/2016      PT End of Session - 09/24/16 1327    Visit Number 13   Number of Visits 24   Date for PT Re-Evaluation 09/20/16   Authorization Type Medicare & G-codes every 10th visit    PT Start Time 1318   PT Stop Time 1400   PT Time Calculation (min) 42 min   Equipment Utilized During Treatment Gait belt   Activity Tolerance Patient tolerated treatment well   Behavior During Therapy [facility] for tasks assessed/performed      Past Medical History:  Diagnosis Date  . Anemia   . Asthma   . Contracture of joint    left knee  . Decubitus ulcer of sacral region, stage 2 03/07/2014  . Diabetes mellitus with peripheral vascular disease ([place])   . End-stage renal disease on hemodialysis [facility])    Started HD March 2014.  Cause of ESRD was DM.  Gets HD at [facility] on [address] on MWF schedule.   Marland Kitchen ESRD on hemodialysis ([place])   . Gangrene ([place])    right BKA  . Gangrene of foot ([place])   . Glaucoma   . Glaucoma 03/07/2014  . Heart murmur   . History of MRSA infection 04/22/2013   Bacteremia assoc w L foot wound infection Mar 2015   . Hyperparathyroidism, secondary renal ([place])   . Hypertension   . Hypothyroidism   . MRSA bacteremia   . Multiple myeloma, without mention of having achieved remission 03/30/2012   Cytogenetic neg on 03/23/2012.  Marland Kitchen Peripheral arterial disease ([place])   . Peripheral vascular disease, unspecified ([place]) 11/19/2012   In the past had R foot toe amps then R TMA. In 2015 had left foot toe amp > then TMA >then L BKA on 05/14/13   . Pneumonia    2012  . Thyroid disease    hyperparathyroidism    Past Surgical History:  Procedure Laterality Date   . ABDOMINAL AORTAGRAM Bilateral 11/06/2012   Procedure: ABDOMINAL AORTAGRAM;  Surgeon: Elam Dutch, MD;  Location: [facility] CATH LAB;  Service: Cardiovascular;  Laterality: Bilateral;  . AMPUTATION Right 11/10/2012   Procedure: AMPUTATION FIRST and SECOND TOES Right Foot;  Surgeon: Elam Dutch, MD;  Location: [place];  Service: Vascular;  Laterality: Right;  . AMPUTATION Left 04/07/2013   Procedure: AMPUTATION DIGIT- LEFT 1ST TOE;  Surgeon: Mal Misty, MD;  Location: [place];  Service: Vascular;  Laterality: Left;  . AMPUTATION Left 04/26/2013   Procedure: Left Foot Transmetatarsal Amputation;  Surgeon: Newt Minion, MD;  Location: [place];  Service: Orthopedics;  Laterality: Left;  . AMPUTATION Left 05/14/2013   Procedure: AMPUTATION BELOW KNEE;  Surgeon: Newt Minion, MD;  Location: [place];  Service: Orthopedics;  Laterality: Left;  Left Below Knee Amputation  . AMPUTATION Left 07/23/2013   Procedure: AMPUTATION BELOW KNEE;  Surgeon: Newt Minion, MD;  Location: [place];  Service: Orthopedics;  Laterality: Left;  Left Below Knee Amputation Revision  . AMPUTATION Right 02/18/2014   Procedure: AMPUTATION BELOW KNEE;  Surgeon: Newt Minion, MD;  Location: [place];  Service: Orthopedics;  Laterality: Right;  . AMPUTATION Right 08/20/2016   Procedure: RIGHT LONG FINGER AMPUTATION;  Surgeon: Leanora Cover, MD;  Location: [place];  Service: Orthopedics;  Laterality: Right;  . AV FISTULA PLACEMENT Left 03/25/2012   Procedure: ARTERIOVENOUS (AV) FISTULA CREATION;  Surgeon: Mal Misty, MD;  Location: [facility];  Service: Vascular;  Laterality: Left;  . BELOW KNEE LEG AMPUTATION Left 05/14/2013   DR [name]  . BELOW KNEE LEG AMPUTATION Right 02/18/2014   dr duda  . CARDIAC CATHETERIZATION     approx 30 years ago  . CERVICAL DISC SURGERY    . EYE SURGERY Bilateral    CATARACTS  . I&D EXTREMITY Left 04/22/2013   Procedure: IRRIGATION AND DEBRIDEMENT LEFT FIRST TOE AMPUTATION WOUND ;  Surgeon:  Mal Misty, MD;  Location: [place];  Service: Vascular;  Laterality: Left;  . I&D EXTREMITY Right 01/05/2014   Procedure: IRRIGATION AND DEBRIDEMENT Right Heel Ulcer;  Surgeon: Mcarthur Rossetti, MD;  Location: [place];  Service: Orthopedics;  Laterality: Right;  Surgeon Available after 5PM  . INSERTION OF DIALYSIS CATHETER Right 03/19/2012   Procedure: INSERTION OF DIALYSIS CATHETER;  Surgeon: Mal Misty, MD;  Location: [place];  Service: Vascular;  Laterality: Right;  Right Internal Jugular  . LIGATION OF COMPETING BRANCHES OF ARTERIOVENOUS FISTULA Left 05/08/2012   Procedure: LIGATION OF COMPETING BRANCHES OF ARTERIOVENOUS FISTULA;  Surgeon: Mal Misty, MD;  Location: [place];  Service: Vascular;  Laterality: Left;  Ultrasound guided  . REPAIR QUADRICEPS/HAMSTRING MUSCLES Left 05/20/2014   Procedure: Left Hamstring Release;  Surgeon: Newt Minion, MD;  Location: [place];  Service: Orthopedics;  Laterality: Left;  . REVISON OF ARTERIOVENOUS FISTULA Left 01/30/2016   Procedure: CEPHALIC VEIN TURNDOWN TO LEFT UPPER ARM;  Surgeon: Elam Dutch, MD;  Location: [place];  Service: Vascular;  Laterality: Left;  . STUMP REVISION Right 05/20/2014   Procedure: Revision Right Below Knee Amputation;  Surgeon: Newt Minion, MD;  Location: [place];  Service: Orthopedics;  Laterality: Right;  . TEE WITHOUT CARDIOVERSION N/A 04/20/2013   Procedure: TRANSESOPHAGEAL ECHOCARDIOGRAM (TEE);  Surgeon: Josue Hector, MD;  Location: [facility] ENDOSCOPY;  Service: Cardiovascular;  Laterality: N/A;  . THYROIDECTOMY    . TOE AMPUTATION     D/C 04-30-13  . TRANSMETATARSAL AMPUTATION Left 12/16/2012   Procedure: TRANSMETATARSAL AMPUTATION AND VAC PLACEMENT;  Surgeon: Elam Dutch, MD;  Location: [place];  Service: Vascular;  Laterality: Left;    There were no vitals filed for this visit.      Subjective Assessment - 09/24/16 1324    Subjective No new compliants. No falls, reports some "near misses".  Has been to [facility] and  expects the new prostheses next week sometime.    Pertinent History bilateral transtibial amputations, anemia, asthma, contracture of L knee, decubitus ulcer of sacral region, DM with PVD, ESRD, glaucoma, heart murmur, HTN, hypothyroidism, multiple myeloma, PAD   Limitations Lifting;Standing;Walking;House hold activities   Patient Stated Goals walk without a person assisting him (walk with device only)    Currently in Pain? Yes   Pain Score 7    Pain Location Hand   Pain Orientation Right   Pain Descriptors / Indicators Aching;Dull;Throbbing  throbbing at times   Pain Type Acute pain   Pain Onset 1 to 4 weeks ago   Pain Frequency Constant   Aggravating Factors  touching, pressure, MD appt this am   Pain Relieving Factors resting with no pressure, aleve            [facility] Adult PT  Treatment/Exercise - 09/24/16 1329      Transfers   Transfers Sit to Stand;Stand to Sit   Sit to Stand 5: Supervision;With upper extremity assist;With armrests;From chair/3-in-1   Sit to Stand Details Verbal cues for sequencing;Verbal cues for technique;Verbal cues for safe use of DME/AE;Verbal cues for precautions/safety   Stand to Sit 5: Supervision;With upper extremity assist;With armrests;To chair/3-in-1   Stand to Sit Details (indicate cue type and reason) Verbal cues for sequencing;Verbal cues for technique;Verbal cues for safe use of DME/AE;Verbal cues for precautions/safety   Comments With RW support: pt is able to reach to toes with min guard assist and forward reaching ~2 inches out of base of support.      Ambulation/Gait   Ambulation/Gait Yes   Ambulation/Gait Assistance 4: Min guard;4: Min assist;3: Mod assist   Ambulation/Gait Assistance Details increased assitance needed as gait distance progressed due to pt reported fatigue. HR 63 after gait (manually taken)   Ambulation Distance (Feet) 115 Feet   Assistive device Rolling walker;Prostheses   Gait Pattern Step-through pattern;Left flexed  knee in stance;Right flexed knee in stance;Wide base of support;Poor foot clearance - left;Poor foot clearance - right;Trunk flexed;Decreased stride length   Ambulation Surface Indoor;Level   Stairs --   Ramp 3: Mod assist   Ramp Details (indicate cue type and reason) cues/assist for posture, walker mangement and step length. 2cd person used for safety.    Curb 4: Min assist;3: Mod assist   Curb Details (indicate cue type and reason) min to ascend 6 inch curb and mod assist to descent curb     Prosthetics   Current prosthetic wear tolerance (days/week)  daily    Current prosthetic wear tolerance (#hours/day)  all awake hours every day   Education Provided Proper wear schedule/adjustment   Person(s) Educated Patient;Child(ren)   Education Method Explanation;Demonstration;Verbal cues   Education Method Verbalized understanding;Returned demonstration;Verbal cues required;Needs further instruction           PT Short Term Goals - 09/24/16 1328      PT SHORT TERM GOAL #1   Title Patient will verbalize understanding and return demonstration of updated HEP. Target 08/23/2016     Baseline 09/24/16: pt and daughter can verbalize HEP, has not been consistent with performance due to other medical issues/fatigue   Time --   Period --   Status Partially Met     PT SHORT TERM GOAL #2   Title Patient will demonstrate ability to reach 5 inches anteriorly and reach within 10 inches from the floor unilateral UE support on RW with supervision to indicate a decrease in his risk of falling.  Target 08/23/2016     Baseline 09/24/16: pt can reach all the way to floor, only ~2 inches forward out of base of support with RW support and min guard assist   Time --   Period --   Status Partially Met     PT SHORT TERM GOAL #3   Title TUG with rollator walker <40 seconds with supervision. Target 08/23/2016     Baseline 09/24/16: no time is session to repeat TUG with RW   Time --   Period --   Status Unable to  assess     PT SHORT TERM GOAL #4   Title Patient will demonstrate ability to ambulate 150 feet on indoor surfaces with rollator style walker and supervision to indicate a decrease in his risk of falling.   Target 08/23/2016     Baseline 09/24/16: pt significantly fatigued  after 115 feet with RW   Time --   Period --   Status Not Met           PT Long Term Goals - 09/25/16 1229      PT LONG TERM GOAL #1   Title Patient will verbalize understanding and return demonstration of ongoing HEP program. (TARGET DATE: 09/23/2016)    Baseline 09/24/16: can verbalize HEP, has not been consistent with performance     PT LONG TERM GOAL #2   Title Patient will ambulate 200 feet on firm, paved outdoor surfaces with family support to indicate a decrease risk of falling when returning to community ambulation with family support.   (TARGET DATE: 09/23/2016)    Baseline 09/24/16: pt fatigued after 115 feet on indoor surfaces, did not test outdoors   Status Unable to assess     PT LONG TERM GOAL #3   Title Patient ambulates 82' with standard RW and prostheses around furniture modified independent.   (TARGET DATE: 09/23/2016)    Baseline 09/24/16: unable to test due to time constraints and pt fatigue   Status Unable to assess     PT LONG TERM GOAL #4   Title Patient will be able to ambulate over ramp/curb with rollator style walker with family assistance to indicate a decrease in his risk of falling when returning to community ambulation with family.   (TARGET DATE: 09/23/2016)    Baseline 09/24/16: pt needed up to mod assist for both today with RW   Status Not Met     PT LONG TERM GOAL #5   Title Patient will demonstrate ability to reach 8 inches anteriorly and reach within 8 inches from the floor unilateral UE support on RW with supervision to indicate a decrease in his risk of falling.   (TARGET DATE: 09/23/2016)    Baseline 09/24/16: can reach to floor and only ~2 inches   Status Not Met             Plan - 09/24/16 1327    Clinical Impression Statement Today's skilled session focused on checking STGs and LTGs for PT to renew pt as he has missed several visits/weeks due to other illness/medical issues. Pt was quick to fatigue today, needing several seated rest breaks. Pt should benefit from continued PT to progress toward unmet goals.    Rehab Potential Good   Clinical Impairments Affecting Rehab Potential bilateral transtibial amputations, anemia, asthma, contracture of L knee, decubitus ulcer of sacral region, DM with PVD, ESRD, glaucoma, heart murmur, HTN, hypothyroidism, multiple myeloma, PAD   PT Frequency 2x / week   PT Duration 8 weeks   PT Treatment/Interventions ADLs/Self Care Home Management;Neuromuscular re-education;Balance training;Therapeutic exercise;Therapeutic activities;Functional mobility training;Stair training;Gait training;DME Instruction;Patient/family education;Prosthetic Training;Manual techniques;Scar mobilization;Passive range of motion   PT Next Visit Plan PT to rewew; continued to work on standing posture/balance, gait with RW and barriers with RW.   Consulted and Agree with Plan of Care Patient;Family member/caregiver   Family Member Consulted dtr-Cory      Jamey Reas, PT, DPT:        Plan - 09/26/16 1145    Clinical Impression Statement During initial 01-XBL certification period, this patient had 2 medical issues that required PT to be held. He also became more deconditioned with limited activity. First issue was Bradycardia and was cleared by cardiologist to continue PT / activities. Patient burned his middle finger of right dominant hand and underwent amputation of distal phalange. PT was held for 3  weeks. Patient's gait is further impaired with difficulty using dominant right hand on walkers. PT switched from rollator walker to standard rolling walker with hand orthosis due to right hand function. Patient needs additional skilled care to progress  mobility and safety.   Rehab Potential Good   Clinical Impairments Affecting Rehab Potential bilateral transtibial amputations, anemia, asthma, contracture of L knee, decubitus ulcer of sacral region, DM with PVD, ESRD, glaucoma, heart murmur, HTN, hypothyroidism, multiple myeloma, PAD   PT Frequency 2x / week   PT Duration Other (comment)  9 weeks (60 days)   PT Treatment/Interventions ADLs/Self Care Home Management;Neuromuscular re-education;Balance training;Therapeutic exercise;Therapeutic activities;Functional mobility training;Stair training;Gait training;DME Instruction;Patient/family education;Prosthetic Training;Manual techniques;Scar mobilization;Passive range of motion   PT Next Visit Plan continue to work on standing posture/balance, gait with RW and barriers with RW towards updated STGs   Consulted and Agree with Plan of Care Patient;Family member/caregiver   Family Member Consulted dtr-Cory         PT Short Term Goals - 09/27/16 1138      PT SHORT TERM GOAL #1   Title Patient will verbalize understanding and return demonstration of updated HEP. (All STGs Target Date 10/24/16)   Time 1   Period Months   Status On-going   Target Date 10/24/16     PT SHORT TERM GOAL #2   Title Patient will demonstrate ability to reach 5 inches anteriorly and reach within 10 inches from the floor unilateral UE support on RW with supervision to indicate a decrease in his risk of falling.     Time 1   Period Months   Status On-going   Target Date 10/24/16     PT SHORT TERM GOAL #3   Title Patient sit to/from stand and ambulates 50' around furniture with std RW & Bil. prostheses around furniture with supervision.    Time 1   Period Months   Status New   Target Date 10/24/16     PT SHORT TERM GOAL #4   Title Patient ambulates 200 feet on indoor surfaces with std rolling walker and supervision    Time 1   Period Months   Status New   Target Date 10/24/16     PT SHORT TERM GOAL #5    Title Patient negotiates ramp & curb with std rolling walker & prostheses with minimal guard.    Time 1   Period Months   Status New   Target Date 10/24/16         PT Long Term Goals - 09/27/16 1134      PT LONG TERM GOAL #1   Title Patient will verbalize understanding and return demonstration of ongoing HEP program. (All LTGs TARGET DATE: 11/22/2016)    Time 8   Period Weeks   Status On-going   Target Date 11/22/16     PT LONG TERM GOAL #2   Title Patient will ambulate 200 feet on firm, paved outdoor surfaces with family support to indicate a decrease risk of falling when returning to community ambulation with family support.     Time 8   Period Weeks   Status On-going   Target Date 11/22/16     PT LONG TERM GOAL #3   Title Patient ambulates 33' with standard RW and prostheses around furniture modified independent.     Time 8   Period Weeks   Status On-going   Target Date 11/22/16     PT LONG TERM GOAL #4   Title Patient will  be able to ambulate over ramp/curb with rollator style walker with family assistance to indicate a decrease in his risk of falling when returning to community ambulation with family.     Time 8   Period Weeks   Status On-going   Target Date 11/22/16     PT LONG TERM GOAL #5   Title Patient will demonstrate ability to reach 8 inches anteriorly and reach within 8 inches from the floor unilateral UE support on RW with supervision to indicate a decrease in his risk of falling.     Time 8   Period Weeks   Status On-going   Target Date 11/22/16     Jamey Reas, PT, DPT PT Specializing in [place] 09/27/16 12:09 PM Phone:  [phone]  Fax:  [phone] [facility] [address] [place], [place] [address]   Patient will benefit from skilled therapeutic intervention in order to improve the following deficits and impairments:  Abnormal gait, Decreased activity tolerance, Decreased balance, Decreased  coordination, Decreased range of motion, Decreased safety awareness, Decreased knowledge of use of DME, Decreased knowledge of precautions, Decreased endurance, Decreased strength, Difficulty walking, Prosthetic Dependency, Postural dysfunction  Visit Diagnosis: Muscle weakness (generalized)  Unsteadiness on feet  Other abnormalities of gait and mobility   Contracture of muscle, multiple sites    Problem List Patient Active Problem List   Diagnosis Date Noted  . Dyslipidemia 11/21/2015  . Diabetic retinopathy ([place])- left eye 10/02/2015  . Complications, amputation stump late ([place]) 05/20/2014  . Weakness generalized 04/07/2014  . Sepsis ([place]) 04/07/2014  . Decubitus ulcer, stage II 04/07/2014  . Decubitus ulcer of ankle   . Fatigue   . Wound infection 04/06/2014  . Decubitus ulcer of sacral region, stage 2 03/07/2014  . Glaucoma 03/07/2014  . Gout 03/07/2014  . Below knee amputation status ([place]) 02/18/2014  . Osteomyelitis ([place]) 01/04/2014  . Phantom limb ([place] 12/14/2013  . Type 2 diabetes mellitus with diabetic foot infection ([place])   . Diabetes mellitus with peripheral vascular disease ([place])   . Asthma 05/17/2013  . S/P bilateral below knee amputation ([place]) 05/17/2013  . History of MRSA infection 04/22/2013  . Peripheral vascular disease ([place]) 11/19/2012  . End-stage renal disease on hemodialysis ([place]) 05/05/2012  . Kahler disease ([place]) 03/30/2012  . Anemia in chronic kidney disease 03/19/2012  . Hypothyroidism following radioiodine therapy 03/19/2012  . Anemia, iron deficiency 03/19/2012  . Hypertension 03/18/2012  . Chronic kidney disease (CKD), stage V ([place]) 03/18/2012  . Cardiac conduction disorder 03/18/2012  . MGUS (monoclonal gammopathy of unknown significance) 02/28/2011  . Monoclonal paraproteinemia 02/28/2011    Willow Ora, PTA, [facility] [address] [place], [place] [address] [phone] 09/25/16, 2:45 PM   Name:  Cordarrel Stiefel MRN: [id number] Date of Birth: 1945-02-21

## 2016-09-25 DIAGNOSIS — D689 Coagulation defect, unspecified: Secondary | ICD-10-CM | POA: Diagnosis not present

## 2016-09-25 DIAGNOSIS — E1029 Type 1 diabetes mellitus with other diabetic kidney complication: Secondary | ICD-10-CM | POA: Diagnosis not present

## 2016-09-25 DIAGNOSIS — T82598A Other mechanical complication of other cardiac and vascular devices and implants, initial encounter: Secondary | ICD-10-CM | POA: Diagnosis not present

## 2016-09-25 DIAGNOSIS — N186 End stage renal disease: Secondary | ICD-10-CM | POA: Diagnosis not present

## 2016-09-25 DIAGNOSIS — D631 Anemia in chronic kidney disease: Secondary | ICD-10-CM | POA: Diagnosis not present

## 2016-09-25 DIAGNOSIS — N2581 Secondary hyperparathyroidism of renal origin: Secondary | ICD-10-CM | POA: Diagnosis not present

## 2016-09-25 DIAGNOSIS — R509 Fever, unspecified: Secondary | ICD-10-CM | POA: Diagnosis not present

## 2016-09-26 ENCOUNTER — Encounter: Payer: Self-pay | Admitting: Physical Therapy

## 2016-09-26 ENCOUNTER — Ambulatory Visit: Payer: Medicare Other | Admitting: Physical Therapy

## 2016-09-26 DIAGNOSIS — R2681 Unsteadiness on feet: Secondary | ICD-10-CM | POA: Diagnosis not present

## 2016-09-26 DIAGNOSIS — R278 Other lack of coordination: Secondary | ICD-10-CM | POA: Diagnosis not present

## 2016-09-26 DIAGNOSIS — M6281 Muscle weakness (generalized): Secondary | ICD-10-CM | POA: Diagnosis not present

## 2016-09-26 DIAGNOSIS — M6249 Contracture of muscle, multiple sites: Secondary | ICD-10-CM | POA: Diagnosis not present

## 2016-09-26 DIAGNOSIS — M25641 Stiffness of right hand, not elsewhere classified: Secondary | ICD-10-CM | POA: Diagnosis not present

## 2016-09-26 DIAGNOSIS — R2689 Other abnormalities of gait and mobility: Secondary | ICD-10-CM | POA: Diagnosis not present

## 2016-09-27 DIAGNOSIS — E1029 Type 1 diabetes mellitus with other diabetic kidney complication: Secondary | ICD-10-CM | POA: Diagnosis not present

## 2016-09-27 DIAGNOSIS — D631 Anemia in chronic kidney disease: Secondary | ICD-10-CM | POA: Diagnosis not present

## 2016-09-27 DIAGNOSIS — T82598A Other mechanical complication of other cardiac and vascular devices and implants, initial encounter: Secondary | ICD-10-CM | POA: Diagnosis not present

## 2016-09-27 DIAGNOSIS — D689 Coagulation defect, unspecified: Secondary | ICD-10-CM | POA: Diagnosis not present

## 2016-09-27 DIAGNOSIS — R509 Fever, unspecified: Secondary | ICD-10-CM | POA: Diagnosis not present

## 2016-09-27 DIAGNOSIS — N2581 Secondary hyperparathyroidism of renal origin: Secondary | ICD-10-CM | POA: Diagnosis not present

## 2016-09-27 DIAGNOSIS — N186 End stage renal disease: Secondary | ICD-10-CM | POA: Diagnosis not present

## 2016-09-27 NOTE — Addendum Note (Signed)
Addended by: Isaias Cowman on: 09/27/2016 12:13 PM   Modules accepted: Orders

## 2016-09-27 NOTE — Therapy (Signed)
Jack [name] [address] [place], [place], [address] Phone: [phone]   Fax:  [phone]  Physical Therapy Treatment  Patient Details  Name: Jack [name] MRN: [id number] Date of Birth: 07/21/1945 Referring Provider: Meridee Score MD   Encounter Date: 09/26/2016      PT End of Session - 09/26/16 1132    Visit Number 14   Number of Visits 24   Date for PT Re-Evaluation 11/23/15   Authorization Type Medicare & G-codes every 10th visit    PT Start Time 0800   PT Stop Time 0844   PT Time Calculation (min) 44 min   Equipment Utilized During Treatment Gait belt   Activity Tolerance Patient tolerated treatment well   Behavior During Therapy [name] for tasks assessed/performed      Past Medical History:  Diagnosis Date  . Anemia   . Asthma   . Contracture of joint    left knee  . Decubitus ulcer of sacral region, stage 2 03/07/2014  . Diabetes mellitus with peripheral vascular disease ([place])   . End-stage renal disease on hemodialysis [facility])    Started HD March 2014.  Cause of ESRD was DM.  Gets HD at [facility] on [address] on MWF schedule.   Marland Kitchen ESRD on hemodialysis ([place])   . Gangrene ([place])    right BKA  . Gangrene of foot ([place])   . Glaucoma   . Glaucoma 03/07/2014  . Heart murmur   . History of MRSA infection 04/22/2013   Bacteremia assoc w L foot wound infection Mar 2015   . Hyperparathyroidism, secondary renal ([place])   . Hypertension   . Hypothyroidism   . MRSA bacteremia   . Multiple myeloma, without mention of having achieved remission 03/30/2012   Cytogenetic neg on 03/23/2012.  Marland Kitchen Peripheral arterial disease ([place])   . Peripheral vascular disease, unspecified ([place]) 11/19/2012   In the past had R foot toe amps then R TMA. In 2015 had left foot toe amp > then TMA >then L BKA on 05/14/13   . Pneumonia    2012  . Thyroid disease    hyperparathyroidism    Past Surgical History:  Procedure Laterality Date   . ABDOMINAL AORTAGRAM Bilateral 11/06/2012   Procedure: ABDOMINAL AORTAGRAM;  Surgeon: Elam Dutch, MD;  Location: [facility] CATH LAB;  Service: Cardiovascular;  Laterality: Bilateral;  . AMPUTATION Right 11/10/2012   Procedure: AMPUTATION FIRST and SECOND TOES Right Foot;  Surgeon: Elam Dutch, MD;  Location: [place];  Service: Vascular;  Laterality: Right;  . AMPUTATION Left 04/07/2013   Procedure: AMPUTATION DIGIT- LEFT 1ST TOE;  Surgeon: Mal Misty, MD;  Location: [place];  Service: Vascular;  Laterality: Left;  . AMPUTATION Left 04/26/2013   Procedure: Left Foot Transmetatarsal Amputation;  Surgeon: Newt Minion, MD;  Location: [place];  Service: Orthopedics;  Laterality: Left;  . AMPUTATION Left 05/14/2013   Procedure: AMPUTATION BELOW KNEE;  Surgeon: Newt Minion, MD;  Location: [place];  Service: Orthopedics;  Laterality: Left;  Left Below Knee Amputation  . AMPUTATION Left 07/23/2013   Procedure: AMPUTATION BELOW KNEE;  Surgeon: Newt Minion, MD;  Location: [place];  Service: Orthopedics;  Laterality: Left;  Left Below Knee Amputation Revision  . AMPUTATION Right 02/18/2014   Procedure: AMPUTATION BELOW KNEE;  Surgeon: Newt Minion, MD;  Location: [place];  Service: Orthopedics;  Laterality: Right;  . AMPUTATION Right 08/20/2016   Procedure: RIGHT LONG FINGER AMPUTATION;  Surgeon: Leanora Cover, MD;  Location: [place];  Service: Orthopedics;  Laterality: Right;  . AV FISTULA PLACEMENT Left 03/25/2012   Procedure: ARTERIOVENOUS (AV) FISTULA CREATION;  Surgeon: Mal Misty, MD;  Location: [place];  Service: Vascular;  Laterality: Left;  . BELOW KNEE LEG AMPUTATION Left 05/14/2013   DR [name]  . BELOW KNEE LEG AMPUTATION Right 02/18/2014   dr duda  . CARDIAC CATHETERIZATION     approx 30 years ago  . CERVICAL DISC SURGERY    . EYE SURGERY Bilateral    CATARACTS  . I&D EXTREMITY Left 04/22/2013   Procedure: IRRIGATION AND DEBRIDEMENT LEFT FIRST TOE AMPUTATION WOUND ;  Surgeon:  Mal Misty, MD;  Location: [place];  Service: Vascular;  Laterality: Left;  . I&D EXTREMITY Right 01/05/2014   Procedure: IRRIGATION AND DEBRIDEMENT Right Heel Ulcer;  Surgeon: Mcarthur Rossetti, MD;  Location: [place];  Service: Orthopedics;  Laterality: Right;  Surgeon Available after 5PM  . INSERTION OF DIALYSIS CATHETER Right 03/19/2012   Procedure: INSERTION OF DIALYSIS CATHETER;  Surgeon: Mal Misty, MD;  Location: [place];  Service: Vascular;  Laterality: Right;  Right Internal Jugular  . LIGATION OF COMPETING BRANCHES OF ARTERIOVENOUS FISTULA Left 05/08/2012   Procedure: LIGATION OF COMPETING BRANCHES OF ARTERIOVENOUS FISTULA;  Surgeon: Mal Misty, MD;  Location: [place];  Service: Vascular;  Laterality: Left;  Ultrasound guided  . REPAIR QUADRICEPS/HAMSTRING MUSCLES Left 05/20/2014   Procedure: Left Hamstring Release;  Surgeon: Newt Minion, MD;  Location: [place];  Service: Orthopedics;  Laterality: Left;  . REVISON OF ARTERIOVENOUS FISTULA Left 01/30/2016   Procedure: CEPHALIC VEIN TURNDOWN TO LEFT UPPER ARM;  Surgeon: Elam Dutch, MD;  Location: [place];  Service: Vascular;  Laterality: Left;  . STUMP REVISION Right 05/20/2014   Procedure: Revision Right Below Knee Amputation;  Surgeon: Newt Minion, MD;  Location: [place];  Service: Orthopedics;  Laterality: Right;  . TEE WITHOUT CARDIOVERSION N/A 04/20/2013   Procedure: TRANSESOPHAGEAL ECHOCARDIOGRAM (TEE);  Surgeon: Josue Hector, MD;  Location[facility] ENDOSCOPY;  Service: Cardiovascular;  Laterality: N/A;  . THYROIDECTOMY    . TOE AMPUTATION     D/C 04-30-13  . TRANSMETATARSAL AMPUTATION Left 12/16/2012   Procedure: TRANSMETATARSAL AMPUTATION AND VAC PLACEMENT;  Surgeon: Elam Dutch, MD;  Location: [place];  Service: Vascular;  Laterality: Left;    There were no vitals filed for this visit.      Subjective Assessment - 09/26/16 0755    Subjective He saw Dr Fredna Dow yesterday and he ordered OT. He said it was healing slowly. No  restrictions to use. His prostheses do not feel right this morning.    Patient is accompained by: Family member   Pertinent History bilateral transtibial amputations, anemia, asthma, contracture of L knee, decubitus ulcer of sacral region, DM with PVD, ESRD, glaucoma, heart murmur, HTN, hypothyroidism, multiple myeloma, PAD   Limitations Lifting;Standing;Walking;House hold activities   Patient Stated Goals walk without a person assisting him (walk with device only)    Currently in Pain? Yes   Pain Score 4    Pain Location Finger (Comment which one)   Pain Orientation Right   Pain Descriptors / Indicators Aching   Pain Type Acute pain   Pain Onset 1 to 4 weeks ago   Pain Frequency Constant   Aggravating Factors  touching, pressure, MD appt this am   Pain Relieving Factors resting with no pressure, aleve  Effect of Pain on Daily Activities difficulty using right hand with ADLs                         [facility] Adult PT Treatment/Exercise - 09/26/16 0800      Transfers   Transfers Sit to Stand;Stand to Sit   Sit to Stand 5: Supervision;With upper extremity assist;With armrests;From chair/3-in-1  to RW   Sit to Stand Details Verbal cues for sequencing;Verbal cues for technique;Verbal cues for safe use of DME/AE;Verbal cues for precautions/safety   Sit to Stand Details (indicate cue type and reason) pt able to secure Rt hand on orthosis with velcro strap   Stand to Sit 5: Supervision;With upper extremity assist;With armrests;To chair/3-in-1  from RW   Stand to Sit Details (indicate cue type and reason) Verbal cues for sequencing;Verbal cues for technique;Verbal cues for safe use of DME/AE;Verbal cues for precautions/safety   Stand to Sit Details Pt able to disconnect right hand from orthosis     Ambulation/Gait   Ambulation/Gait Yes   Ambulation/Gait Assistance 4: Min guard;5: Supervision   Ambulation/Gait Assistance Details verbal cues on posture, RW position & step  length.   Ambulation Distance (Feet) 130 Feet  130' & 80'   Assistive device Rolling walker;Prostheses  right hand walker orthosis   Gait Pattern Step-through pattern;Left flexed knee in stance;Right flexed knee in stance;Wide base of support;Poor foot clearance - left;Poor foot clearance - right;Trunk flexed;Decreased stride length   Ambulation Surface Indoor;Level   Ramp 4: Min assist  RW & bil. TTA prostheses   Ramp Details (indicate cue type and reason) verbal & tactile cues on technique including posture & wt shift   Curb 4: Min assist;3: Mod assist  ModA with RW movement up/down curb, MinA remaining portion   Curb Details (indicate cue type and reason) Verbal, demo & manual cues on technique including foot position, sequence and wt shift     Neuro Re-ed    Neuro Re-ed Details  Balance activities standing with RW RUE support red theraband with LUE reaching forward 10 reps, rows 10 reps & biceps curls 10 reps. Pt needed seated rest between each exercise.      Prosthetics   Prosthetic Care Comments  Pt arrived with inner flexible sockets on incorrect limbs (right on LLE & left on RLE). PT instructed and demo correction and proper orientation in relation to patella.    Current prosthetic wear tolerance (days/week)  daily    Current prosthetic wear tolerance (#hours/day)  all awake hours every day including dialysis   Residual limb condition  No skin issues.    Education Provided Proper wear schedule/adjustment;Proper Donning;Other (comment)  see prosthetic care comments   Person(s) Educated Patient;Child(ren)   Education Method Explanation;Tactile cues;Demonstration;Verbal cues   Education Method Verbalized understanding;Needs further instruction                  PT Short Term Goals - 09/27/16 1138      PT SHORT TERM GOAL #1   Title Patient will verbalize understanding and return demonstration of updated HEP. (All STGs Target Date 10/24/16)   Time 1   Period Months    Status On-going   Target Date 10/24/16     PT SHORT TERM GOAL #2   Title Patient will demonstrate ability to reach 5 inches anteriorly and reach within 10 inches from the floor unilateral UE support on RW with supervision to indicate a decrease in his risk of falling.  Time 1   Period Months   Status On-going   Target Date 10/24/16     PT SHORT TERM GOAL #3   Title Patient sit to/from stand and ambulates 50' around furniture with std RW & Bil. prostheses around furniture with supervision.    Time 1   Period Months   Status New   Target Date 10/24/16     PT SHORT TERM GOAL #4   Title Patient ambulates 200 feet on indoor surfaces with std rolling walker and supervision    Time 1   Period Months   Status New   Target Date 10/24/16     PT SHORT TERM GOAL #5   Title Patient negotiates ramp & curb with std rolling walker & prostheses with minimal guard.    Time 1   Period Months   Status New   Target Date 10/24/16           PT Long Term Goals - 09/27/16 1134      PT LONG TERM GOAL #1   Title Patient will verbalize understanding and return demonstration of ongoing HEP program. (All LTGs TARGET DATE: 11/22/2016)    Time 8   Period Weeks   Status On-going   Target Date 11/22/16     PT LONG TERM GOAL #2   Title Patient will ambulate 200 feet on firm, paved outdoor surfaces with family support to indicate a decrease risk of falling when returning to community ambulation with family support.     Time 8   Period Weeks   Status On-going   Target Date 11/22/16     PT LONG TERM GOAL #3   Title Patient ambulates 54' with standard RW and prostheses around furniture modified independent.     Time 8   Period Weeks   Status On-going   Target Date 11/22/16     PT LONG TERM GOAL #4   Title Patient will be able to ambulate over ramp/curb with rollator style walker with family assistance to indicate a decrease in his risk of falling when returning to community ambulation with  family.     Time 8   Period Weeks   Status On-going   Target Date 11/22/16     PT LONG TERM GOAL #5   Title Patient will demonstrate ability to reach 8 inches anteriorly and reach within 8 inches from the floor unilateral UE support on RW with supervision to indicate a decrease in his risk of falling.     Time 8   Period Weeks   Status On-going   Target Date 11/22/16               Plan - 09/26/16 1145    Clinical Impression Statement Patient and his daughter appear to understand donning issue which probably happened with donning in early morning hour in a rush to meet [employer] bus. Patient improved negotiation of curb with skilled instruction in technique. Patient fatigues quicker requiring more frequent rest breaks.    Rehab Potential Good   Clinical Impairments Affecting Rehab Potential bilateral transtibial amputations, anemia, asthma, contracture of L knee, decubitus ulcer of sacral region, DM with PVD, ESRD, glaucoma, heart murmur, HTN, hypothyroidism, multiple myeloma, PAD   PT Frequency 2x / week   PT Duration Other (comment)  9 weeks (60 days)   PT Treatment/Interventions ADLs/Self Care Home Management;Neuromuscular re-education;Balance training;Therapeutic exercise;Therapeutic activities;Functional mobility training;Stair training;Gait training;DME Instruction;Patient/family education;Prosthetic Training;Manual techniques;Scar mobilization;Passive range of motion   PT Next Visit Plan continue to  work on standing posture/balance, gait with RW and barriers with RW towards updated STGs   Consulted and Agree with Plan of Care Patient;Family member/caregiver   Family Member Consulted dtr-Cory      Patient will benefit from skilled therapeutic intervention in order to improve the following deficits and impairments:  Abnormal gait, Decreased activity tolerance, Decreased balance, Decreased coordination, Decreased range of motion, Decreased safety awareness, Decreased knowledge of  use of DME, Decreased knowledge of precautions, Decreased endurance, Decreased strength, Difficulty walking, Prosthetic Dependency, Postural dysfunction  Visit Diagnosis: Muscle weakness (generalized)  Unsteadiness on feet  Other abnormalities of gait and mobility  Contracture of muscle, multiple sites     Problem List Patient Active Problem List   Diagnosis Date Noted  . Dyslipidemia 11/21/2015  . Diabetic retinopathy ([place])- left eye 10/02/2015  . Complications, amputation stump late ([place]) 05/20/2014  . Weakness generalized 04/07/2014  . Sepsis ([place]) 04/07/2014  . Decubitus ulcer, stage II 04/07/2014  . Decubitus ulcer of ankle   . Fatigue   . Wound infection 04/06/2014  . Decubitus ulcer of sacral region, stage 2 03/07/2014  . Glaucoma 03/07/2014  . Gout 03/07/2014  . Below knee amputation status ([place]) 02/18/2014  . Osteomyelitis ([place]) 01/04/2014  . Phantom limb ([place]) 12/14/2013  . Type 2 diabetes mellitus with diabetic foot infection ([place])   . Diabetes mellitus with peripheral vascular disease ([place])   . Asthma 05/17/2013  . S/P bilateral below knee amputation ([place]) 05/17/2013  . History of MRSA infection 04/22/2013  . Peripheral vascular disease ([place]) 11/19/2012  . End-stage renal disease on hemodialysis ([place]) 05/05/2012  . Kahler disease ([place]) 03/30/2012  . Anemia in chronic kidney disease 03/19/2012  . Hypothyroidism following radioiodine therapy 03/19/2012  . Anemia, iron deficiency 03/19/2012  . Hypertension 03/18/2012  . Chronic kidney disease (CKD), stage V ([place]) 03/18/2012  . Cardiac conduction disorder 03/18/2012  . MGUS (monoclonal gammopathy of unknown significance) 02/28/2011  . Monoclonal paraproteinemia 02/28/2011    Ronny Korff PT, DPT 09/27/2016, 11:51 AM  [facility] [address] [place], [place], [address] Phone: [phone]   Fax:  [phone]  Name: Jack [name] MRN: [id number] Date of Birth: 06/16/1945

## 2016-09-30 DIAGNOSIS — N2581 Secondary hyperparathyroidism of renal origin: Secondary | ICD-10-CM | POA: Diagnosis not present

## 2016-09-30 DIAGNOSIS — D689 Coagulation defect, unspecified: Secondary | ICD-10-CM | POA: Diagnosis not present

## 2016-09-30 DIAGNOSIS — D631 Anemia in chronic kidney disease: Secondary | ICD-10-CM | POA: Diagnosis not present

## 2016-09-30 DIAGNOSIS — E1029 Type 1 diabetes mellitus with other diabetic kidney complication: Secondary | ICD-10-CM | POA: Diagnosis not present

## 2016-09-30 DIAGNOSIS — T82598A Other mechanical complication of other cardiac and vascular devices and implants, initial encounter: Secondary | ICD-10-CM | POA: Diagnosis not present

## 2016-09-30 DIAGNOSIS — N186 End stage renal disease: Secondary | ICD-10-CM | POA: Diagnosis not present

## 2016-09-30 DIAGNOSIS — R509 Fever, unspecified: Secondary | ICD-10-CM | POA: Diagnosis not present

## 2016-10-01 ENCOUNTER — Encounter: Payer: Self-pay | Admitting: Physical Therapy

## 2016-10-01 ENCOUNTER — Ambulatory Visit: Payer: Medicare Other | Admitting: Physical Therapy

## 2016-10-01 ENCOUNTER — Ambulatory Visit: Payer: Medicare Other | Admitting: Occupational Therapy

## 2016-10-01 DIAGNOSIS — R2689 Other abnormalities of gait and mobility: Secondary | ICD-10-CM

## 2016-10-01 DIAGNOSIS — M25641 Stiffness of right hand, not elsewhere classified: Secondary | ICD-10-CM

## 2016-10-01 DIAGNOSIS — R2681 Unsteadiness on feet: Secondary | ICD-10-CM | POA: Diagnosis not present

## 2016-10-01 DIAGNOSIS — R278 Other lack of coordination: Secondary | ICD-10-CM | POA: Diagnosis not present

## 2016-10-01 DIAGNOSIS — M6249 Contracture of muscle, multiple sites: Secondary | ICD-10-CM

## 2016-10-01 DIAGNOSIS — M6281 Muscle weakness (generalized): Secondary | ICD-10-CM

## 2016-10-01 NOTE — Therapy (Signed)
[facility] [address] [place], [place], [address] Phone: [phone]   Fax:  [phone]  Physical Therapy Treatment  Patient Details  Name: Jack [name] MRN: [id number] Date of Birth: [birth year] Referring Provider: Meridee Score MD   Encounter Date: 10/01/2016      PT End of Session - 10/01/16 1249    Visit Number 15   Number of Visits 24   Date for PT Re-Evaluation 11/23/15   Authorization Type Medicare & G-codes every 10th visit    PT Start Time 0845   PT Stop Time 0930   PT Time Calculation (min) 45 min   Equipment Utilized During Treatment Gait belt   Activity Tolerance Patient tolerated treatment well   Behavior During Therapy [facility] for tasks assessed/performed      Past Medical History:  Diagnosis Date  . Anemia   . Asthma   . Contracture of joint    left knee  . Decubitus ulcer of sacral region, stage 2 03/07/2014  . Diabetes mellitus with peripheral vascular disease ([place])   . End-stage renal disease on hemodialysis [facility])    Started HD March 2014.  Cause of ESRD was DM.  Gets HD at [facility] on [address] on MWF schedule.   Marland Kitchen ESRD on hemodialysis ([place])   . Gangrene ([place])    right BKA  . Gangrene of foot ([place])   . Glaucoma   . Glaucoma 03/07/2014  . Heart murmur   . History of MRSA infection 04/22/2013   Bacteremia assoc w L foot wound infection Mar 2015   . Hyperparathyroidism, secondary renal ([place])   . Hypertension   . Hypothyroidism   . MRSA bacteremia   . Multiple myeloma, without mention of having achieved remission 03/30/2012   Cytogenetic neg on 03/23/2012.  Marland Kitchen Peripheral arterial disease ([place])   . Peripheral vascular disease, unspecified ([place]) 11/19/2012   In the past had R foot toe amps then R TMA. In 2015 had left foot toe amp > then TMA >then L BKA on 05/14/13   . Pneumonia    2012  . Thyroid disease    hyperparathyroidism    Past Surgical History:  Procedure Laterality Date   . ABDOMINAL AORTAGRAM Bilateral 11/06/2012   Procedure: ABDOMINAL AORTAGRAM;  Surgeon: Elam Dutch, MD;  Location: [facility] CATH LAB;  Service: Cardiovascular;  Laterality: Bilateral;  . AMPUTATION Right 11/10/2012   Procedure: AMPUTATION FIRST and SECOND TOES Right Foot;  Surgeon: Elam Dutch, MD;  Location: [place];  Service: Vascular;  Laterality: Right;  . AMPUTATION Left 04/07/2013   Procedure: AMPUTATION DIGIT- LEFT 1ST TOE;  Surgeon: Mal Misty, MD;  Location: [place];  Service: Vascular;  Laterality: Left;  . AMPUTATION Left 04/26/2013   Procedure: Left Foot Transmetatarsal Amputation;  Surgeon: Newt Minion, MD;  Location: [place];  Service: Orthopedics;  Laterality: Left;  . AMPUTATION Left 05/14/2013   Procedure: AMPUTATION BELOW KNEE;  Surgeon: Newt Minion, MD;  Location: [place];  Service: Orthopedics;  Laterality: Left;  Left Below Knee Amputation  . AMPUTATION Left 07/23/2013   Procedure: AMPUTATION BELOW KNEE;  Surgeon: Newt Minion, MD;  Location: [place];  Service: Orthopedics;  Laterality: Left;  Left Below Knee Amputation Revision  . AMPUTATION Right 02/18/2014   Procedure: AMPUTATION BELOW KNEE;  Surgeon: Newt Minion, MD;  Location: [place];  Service: Orthopedics;  Laterality: Right;  . AMPUTATION Right 08/20/2016   Procedure: RIGHT LONG FINGER AMPUTATION;  Surgeon: Leanora Cover, MD;  Location: [place];  Service: Orthopedics;  Laterality: Right;  . AV FISTULA PLACEMENT Left 03/25/2012   Procedure: ARTERIOVENOUS (AV) FISTULA CREATION;  Surgeon: Mal Misty, MD;  Location: [place];  Service: Vascular;  Laterality: Left;  . BELOW KNEE LEG AMPUTATION Left 05/14/2013   DR [name]  . BELOW KNEE LEG AMPUTATION Right 02/18/2014   dr duda  . CARDIAC CATHETERIZATION     approx 30 years ago  . CERVICAL DISC SURGERY    . EYE SURGERY Bilateral    CATARACTS  . I&D EXTREMITY Left 04/22/2013   Procedure: IRRIGATION AND DEBRIDEMENT LEFT FIRST TOE AMPUTATION WOUND ;  Surgeon:  Mal Misty, MD;  Location: [place];  Service: Vascular;  Laterality: Left;  . I&D EXTREMITY Right 01/05/2014   Procedure: IRRIGATION AND DEBRIDEMENT Right Heel Ulcer;  Surgeon: Mcarthur Rossetti, MD;  Location: [place];  Service: Orthopedics;  Laterality: Right;  Surgeon Available after 5PM  . INSERTION OF DIALYSIS CATHETER Right 03/19/2012   Procedure: INSERTION OF DIALYSIS CATHETER;  Surgeon: Mal Misty, MD;  Location: [place];  Service: Vascular;  Laterality: Right;  Right Internal Jugular  . LIGATION OF COMPETING BRANCHES OF ARTERIOVENOUS FISTULA Left 05/08/2012   Procedure: LIGATION OF COMPETING BRANCHES OF ARTERIOVENOUS FISTULA;  Surgeon: Mal Misty, MD;  Location: [place];  Service: Vascular;  Laterality: Left;  Ultrasound guided  . REPAIR QUADRICEPS/HAMSTRING MUSCLES Left 05/20/2014   Procedure: Left Hamstring Release;  Surgeon: Newt Minion, MD;  Location: [place];  Service: Orthopedics;  Laterality: Left;  . REVISON OF ARTERIOVENOUS FISTULA Left 01/30/2016   Procedure: CEPHALIC VEIN TURNDOWN TO LEFT UPPER ARM;  Surgeon: Elam Dutch, MD;  Location: [place];  Service: Vascular;  Laterality: Left;  . STUMP REVISION Right 05/20/2014   Procedure: Revision Right Below Knee Amputation;  Surgeon: Newt Minion, MD;  Location: [place];  Service: Orthopedics;  Laterality: Right;  . TEE WITHOUT CARDIOVERSION N/A 04/20/2013   Procedure: TRANSESOPHAGEAL ECHOCARDIOGRAM (TEE);  Surgeon: Josue Hector, MD;  Location: [facility] ENDOSCOPY;  Service: Cardiovascular;  Laterality: N/A;  . THYROIDECTOMY    . TOE AMPUTATION     D/C 04-30-13  . TRANSMETATARSAL AMPUTATION Left 12/16/2012   Procedure: TRANSMETATARSAL AMPUTATION AND VAC PLACEMENT;  Surgeon: Elam Dutch, MD;  Location: [place];  Service: Vascular;  Laterality: Left;    There were no vitals filed for this visit.      Subjective Assessment - 10/01/16 0845    Subjective He is wearing prostheses all awake hours. He is walking with family some  non-dialysis days.      Patient is accompained by: Family member   Pertinent History bilateral transtibial amputations, anemia, asthma, contracture of L knee, decubitus ulcer of sacral region, DM with PVD, ESRD, glaucoma, heart murmur, HTN, hypothyroidism, multiple myeloma, PAD   Limitations Lifting;Standing;Walking;House hold activities   Patient Stated Goals walk without a person assisting him (walk with device only)    Currently in Pain? Yes   Pain Score 2    Pain Location Finger (Comment which one)   Pain Orientation Right   Pain Descriptors / Indicators Aching   Pain Type Acute pain   Pain Onset 1 to 4 weeks ago   Pain Frequency Constant   Aggravating Factors  touching, pressure,                          [facility]  Adult PT Treatment/Exercise - 10/01/16 0845      Transfers   Transfers Sit to Stand;Stand to Sit   Sit to Stand 5: Supervision;With upper extremity assist;With armrests;From chair/3-in-1  to RW   Sit to Stand Details Verbal cues for sequencing;Verbal cues for technique;Verbal cues for safe use of DME/AE;Verbal cues for precautions/safety   Stand to Sit 5: Supervision;With upper extremity assist;With armrests;To chair/3-in-1  from RW   Stand to Sit Details (indicate cue type and reason) Verbal cues for sequencing;Verbal cues for technique;Verbal cues for safe use of DME/AE;Verbal cues for precautions/safety     Ambulation/Gait   Ambulation/Gait Yes   Ambulation/Gait Assistance 4: Min guard;5: Supervision   Ambulation/Gait Assistance Details verbal cues on posture, step length and wt shift.  Rollator walker with verbal cues on control.    Ambulation Distance (Feet) 105 Feet  105 & 101' with std RW & 68' with rollator   Assistive device Rolling walker;Prostheses;Rollator  right hand walker orthosis on std RW   Gait Pattern Step-through pattern;Left flexed knee in stance;Right flexed knee in stance;Wide base of support;Poor foot clearance - left;Poor foot  clearance - right;Trunk flexed;Decreased stride length   Ambulation Surface Indoor;Level   Ramp 4: Min assist  std RW & bil. TTA prostheses   Ramp Details (indicate cue type and reason) verbal & tactile cues on RW control, posture & wt shift.    Curb 4: Min assist   Curb Details (indicate cue type and reason) Improved RW control. Verbal & tactile cues on foot position, sequence & step length.    Pre-Gait Activities PT had patient squeeze rollator walker right brake 10 times and put on brake lock /unlock 5 times in sitting. No hand pain or difficulty using rollator brake with his right hand.      Neuro Re-ed    Neuro Re-ed Details  Balance activities standing with RW RUE support red theraband with LUE reaching forward 10 reps, rows 10 reps & biceps curls 10 reps. Pt needed seated rest between each exercise.      Prosthetics   Prosthetic Care Comments  Patient is scheduled to get new sockets for his prostheses on Thursday 9/20. PT advised to check skin 3 times during day for 2-3 days. If no issues check 2x/day for 2-3 days then 1x/day to as needed.    Current prosthetic wear tolerance (days/week)  daily    Current prosthetic wear tolerance (#hours/day)  all awake hours every day including dialysis   Residual limb condition  No skin issues.    Education Provided Other (comment)  [name] prosthetic care comments   Person(s) Educated Patient;Child(ren)   Education Method Explanation;Verbal cues   Education Method Verbalized understanding;Needs further instruction                  PT Short Term Goals - 09/27/16 1138      PT SHORT TERM GOAL #1   Title Patient will verbalize understanding and return demonstration of updated HEP. (All STGs Target Date 10/24/16)   Time 1   Period Months   Status On-going   Target Date 10/24/16     PT SHORT TERM GOAL #2   Title Patient will demonstrate ability to reach 5 inches anteriorly and reach within 10 inches from the floor unilateral UE support  on RW with supervision to indicate a decrease in his risk of falling.     Time 1   Period Months   Status On-going   Target Date 10/24/16  PT SHORT TERM GOAL #3   Title Patient sit to/from stand and ambulates 50' around furniture with std RW & Bil. prostheses around furniture with supervision.    Time 1   Period Months   Status New   Target Date 10/24/16     PT SHORT TERM GOAL #4   Title Patient ambulates 200 feet on indoor surfaces with std rolling walker and supervision    Time 1   Period Months   Status New   Target Date 10/24/16     PT SHORT TERM GOAL #5   Title Patient negotiates ramp & curb with std rolling walker & prostheses with minimal guard.    Time 1   Period Months   Status New   Target Date 10/24/16           PT Long Term Goals - 09/27/16 1134      PT LONG TERM GOAL #1   Title Patient will verbalize understanding and return demonstration of ongoing HEP program. (All LTGs TARGET DATE: 11/22/2016)    Time 8   Period Weeks   Status On-going   Target Date 11/22/16     PT LONG TERM GOAL #2   Title Patient will ambulate 200 feet on firm, paved outdoor surfaces with family support to indicate a decrease risk of falling when returning to community ambulation with family support.     Time 8   Period Weeks   Status On-going   Target Date 11/22/16     PT LONG TERM GOAL #3   Title Patient ambulates 23' with standard RW and prostheses around furniture modified independent.     Time 8   Period Weeks   Status On-going   Target Date 11/22/16     PT LONG TERM GOAL #4   Title Patient will be able to ambulate over ramp/curb with rollator style walker with family assistance to indicate a decrease in his risk of falling when returning to community ambulation with family.     Time 8   Period Weeks   Status On-going   Target Date 11/22/16     PT LONG TERM GOAL #5   Title Patient will demonstrate ability to reach 8 inches anteriorly and reach within 8 inches  from the floor unilateral UE support on RW with supervision to indicate a decrease in his risk of falling.     Time 8   Period Weeks   Status On-going   Target Date 11/22/16               Plan - 10/01/16 1249    Clinical Impression Statement Patient was able to use rollator walker with right hand today without increased pain or issues. Patient improved control of RW on ramp & curb today.    Rehab Potential Good   Clinical Impairments Affecting Rehab Potential bilateral transtibial amputations, anemia, asthma, contracture of L knee, decubitus ulcer of sacral region, DM with PVD, ESRD, glaucoma, heart murmur, HTN, hypothyroidism, multiple myeloma, PAD   PT Frequency 2x / week   PT Duration Other (comment)  9 weeks (60 days)   PT Treatment/Interventions ADLs/Self Care Home Management;Neuromuscular re-education;Balance training;Therapeutic exercise;Therapeutic activities;Functional mobility training;Stair training;Gait training;DME Instruction;Patient/family education;Prosthetic Training;Manual techniques;Scar mobilization;Passive range of motion   PT Next Visit Plan continue to work on standing posture/balance, gait with RW / rollator and barriers with RW towards updated STGs   Consulted and Agree with Plan of Care Patient;Family member/caregiver   Family Member Consulted dtr-Cory  Patient will benefit from skilled therapeutic intervention in order to improve the following deficits and impairments:  Abnormal gait, Decreased activity tolerance, Decreased balance, Decreased coordination, Decreased range of motion, Decreased safety awareness, Decreased knowledge of use of DME, Decreased knowledge of precautions, Decreased endurance, Decreased strength, Difficulty walking, Prosthetic Dependency, Postural dysfunction  Visit Diagnosis: Muscle weakness (generalized)  Unsteadiness on feet  Other abnormalities of gait and mobility  Contracture of muscle, multiple sites     Problem  List Patient Active Problem List   Diagnosis Date Noted  . Dyslipidemia 11/21/2015  . Diabetic retinopathy ([place])- left eye 10/02/2015  . Complications, amputation stump late ([place]) 05/20/2014  . Weakness generalized 04/07/2014  . Sepsis ([place]) 04/07/2014  . Decubitus ulcer, stage II 04/07/2014  . Decubitus ulcer of ankle   . Fatigue   . Wound infection 04/06/2014  . Decubitus ulcer of sacral region, stage 2 03/07/2014  . Glaucoma 03/07/2014  . Gout 03/07/2014  . Below knee amputation status ([place]) 02/18/2014  . Osteomyelitis ([place]) 01/04/2014  . Phantom limb ([place]) 12/14/2013  . Type 2 diabetes mellitus with diabetic foot infection ([place])   . Diabetes mellitus with peripheral vascular disease ([place])   . Asthma 05/17/2013  . S/P bilateral below knee amputation ([place]) 05/17/2013  . History of MRSA infection 04/22/2013  . Peripheral vascular disease ([place]) 11/19/2012  . End-stage renal disease on hemodialysis ([place]) 05/05/2012  . Kahler disease ([place]) 03/30/2012  . Anemia in chronic kidney disease 03/19/2012  . Hypothyroidism following radioiodine therapy 03/19/2012  . Anemia, iron deficiency 03/19/2012  . Hypertension 03/18/2012  . Chronic kidney disease (CKD), stage V ([place]) 03/18/2012  . Cardiac conduction disorder 03/18/2012  . MGUS (monoclonal gammopathy of unknown significance) 02/28/2011  . Monoclonal paraproteinemia 02/28/2011    Jamey Reas PT, DPT 10/01/2016, 12:51 PM  [facility] [address] [place], [place], [address] Phone: [phone]   Fax:  [phone]  Name: Jack [name] MRN: [id number] Date of Birth: 27-May-1945

## 2016-10-01 NOTE — Patient Instructions (Signed)
Flexor Tendon Gliding (Active Hook Fist)   With fingers and knuckles straight, bend middle and tip joints. Do not bend large knuckles. Repeat _10-15___ times. Do _4-6___ sessions per day.  MP Flexion (Active)   With back of hand on table, bend large knuckles as far as they will go, keeping small joints straight. Repeat _10-15___ times. Do __4-6__ sessions per day. Activity: Reach into a narrow container.*      Finger Flexion / Extension   With palm up, bend fingers of left hand toward palm, making a  fist. Straighten fingers, opening fist. Repeat sequence _10-15___ times per session. Do _4-6__ sessions per day. Hand Variation: Palm down      AROM: PIP Flexion / Extension    Pinch bottom knuckle of ___middle finger_____ finger of right hand to prevent bending. Actively bend middle knuckle until stretch is felt. Hold ___5_ seconds. Relax. Straighten finger as far as possible. Repeat __10__ times per set. Do __1__ sets per session. Do _4___ sessions per day.  Copyright  VHI. All rights reserved.

## 2016-10-01 NOTE — Therapy (Signed)
[facility] [address] [place], [place], [address] Phone: [phone]   Fax:  [phone]  Occupational Therapy Treatment  Patient Details  Name: Jack [name] MRN: [id number] Date of Birth: 1945-07-25 Referring Provider: Dr. Leanora Cover  Encounter Date: 10/01/2016      OT End of Session - 10/01/16 1054    Visit Number 1   Number of Visits 17   Date for OT Re-Evaluation 11/30/16   Authorization Type [employer] MCR   Authorization - Visit Number 1   Authorization - Number of Visits 10   OT Start Time 0815   OT Stop Time 0845   OT Time Calculation (min) 30 min   Activity Tolerance Patient tolerated treatment well   Behavior During Therapy [facility] for tasks assessed/performed      Past Medical History:  Diagnosis Date  . Anemia   . Asthma   . Contracture of joint    left knee  . Decubitus ulcer of sacral region, stage 2 03/07/2014  . Diabetes mellitus with peripheral vascular disease ([place])   . End-stage renal disease on hemodialysis [facility])    Started HD March 2014.  Cause of ESRD was DM.  Gets HD at [facility] on [address] on MWF schedule.   Marland Kitchen ESRD on hemodialysis ([place])   . Gangrene ([place])    right BKA  . Gangrene of foot ([place])   . Glaucoma   . Glaucoma 03/07/2014  . Heart murmur   . History of MRSA infection 04/22/2013   Bacteremia assoc w L foot wound infection Mar 2015   . Hyperparathyroidism, secondary renal ([place])   . Hypertension   . Hypothyroidism   . MRSA bacteremia   . Multiple myeloma, without mention of having achieved remission 03/30/2012   Cytogenetic neg on 03/23/2012.  Marland Kitchen Peripheral arterial disease ([place])   . Peripheral vascular disease, unspecified ([place]) 11/19/2012   In the past had R foot toe amps then R TMA. In 2015 had left foot toe amp > then TMA >then L BKA on 05/14/13   . Pneumonia    2012  . Thyroid disease    hyperparathyroidism    Past Surgical History:  Procedure Laterality Date  .  ABDOMINAL AORTAGRAM Bilateral 11/06/2012   Procedure: ABDOMINAL AORTAGRAM;  Surgeon: Elam Dutch, MD;  Location: [facility] CATH LAB;  Service: Cardiovascular;  Laterality: Bilateral;  . AMPUTATION Right 11/10/2012   Procedure: AMPUTATION FIRST and SECOND TOES Right Foot;  Surgeon: Elam Dutch, MD;  Location: [place];  Service: Vascular;  Laterality: Right;  . AMPUTATION Left 04/07/2013   Procedure: AMPUTATION DIGIT- LEFT 1ST TOE;  Surgeon: Mal Misty, MD;  Location: [place];  Service: Vascular;  Laterality: Left;  . AMPUTATION Left 04/26/2013   Procedure: Left Foot Transmetatarsal Amputation;  Surgeon: Newt Minion, MD;  Location: [place];  Service: Orthopedics;  Laterality: Left;  . AMPUTATION Left 05/14/2013   Procedure: AMPUTATION BELOW KNEE;  Surgeon: Newt Minion, MD;  Location: [place];  Service: Orthopedics;  Laterality: Left;  Left Below Knee Amputation  . AMPUTATION Left 07/23/2013   Procedure: AMPUTATION BELOW KNEE;  Surgeon: Newt Minion, MD;  Location: [place];  Service: Orthopedics;  Laterality: Left;  Left Below Knee Amputation Revision  . AMPUTATION Right 02/18/2014   Procedure: AMPUTATION BELOW KNEE;  Surgeon: Newt Minion, MD;  Location: [place];  Service: Orthopedics;  Laterality: Right;  . AMPUTATION Right 08/20/2016   Procedure: RIGHT LONG FINGER  AMPUTATION;  Surgeon: Leanora Cover, MD;  Location: [place];  Service: Orthopedics;  Laterality: Right;  . AV FISTULA PLACEMENT Left 03/25/2012   Procedure: ARTERIOVENOUS (AV) FISTULA CREATION;  Surgeon: Mal Misty, MD;  Location: [place];  Service: Vascular;  Laterality: Left;  . BELOW KNEE LEG AMPUTATION Left 05/14/2013   DR [name]  . BELOW KNEE LEG AMPUTATION Right 02/18/2014   dr duda  . CARDIAC CATHETERIZATION     approx 30 years ago  . CERVICAL DISC SURGERY    . EYE SURGERY Bilateral    CATARACTS  . I&D EXTREMITY Left 04/22/2013   Procedure: IRRIGATION AND DEBRIDEMENT LEFT FIRST TOE AMPUTATION WOUND ;  Surgeon: Mal Misty, MD;  Location: [place];  Service: Vascular;  Laterality: Left;  . I&D EXTREMITY Right 01/05/2014   Procedure: IRRIGATION AND DEBRIDEMENT Right Heel Ulcer;  Surgeon: Mcarthur Rossetti, MD;  Location: [place];  Service: Orthopedics;  Laterality: Right;  Surgeon Available after 5PM  . INSERTION OF DIALYSIS CATHETER Right 03/19/2012   Procedure: INSERTION OF DIALYSIS CATHETER;  Surgeon: Mal Misty, MD;  Location: [place];  Service: Vascular;  Laterality: Right;  Right Internal Jugular  . LIGATION OF COMPETING BRANCHES OF ARTERIOVENOUS FISTULA Left 05/08/2012   Procedure: LIGATION OF COMPETING BRANCHES OF ARTERIOVENOUS FISTULA;  Surgeon: Mal Misty, MD;  Location: [place];  Service: Vascular;  Laterality: Left;  Ultrasound guided  . REPAIR QUADRICEPS/HAMSTRING MUSCLES Left 05/20/2014   Procedure: Left Hamstring Release;  Surgeon: Newt Minion, MD;  Location: [place];  Service: Orthopedics;  Laterality: Left;  . REVISON OF ARTERIOVENOUS FISTULA Left 01/30/2016   Procedure: CEPHALIC VEIN TURNDOWN TO LEFT UPPER ARM;  Surgeon: Elam Dutch, MD;  Location: [place];  Service: Vascular;  Laterality: Left;  . STUMP REVISION Right 05/20/2014   Procedure: Revision Right Below Knee Amputation;  Surgeon: Newt Minion, MD;  Location: [place];  Service: Orthopedics;  Laterality: Right;  . TEE WITHOUT CARDIOVERSION N/A 04/20/2013   Procedure: TRANSESOPHAGEAL ECHOCARDIOGRAM (TEE);  Surgeon: Josue Hector, MD;  Location: [facility] ENDOSCOPY;  Service: Cardiovascular;  Laterality: N/A;  . THYROIDECTOMY    . TOE AMPUTATION     D/C 04-30-13  . TRANSMETATARSAL AMPUTATION Left 12/16/2012   Procedure: TRANSMETATARSAL AMPUTATION AND VAC PLACEMENT;  Surgeon: Elam Dutch, MD;  Location: [place];  Service: Vascular;  Laterality: Left;    There were no vitals filed for this visit.      Subjective Assessment - 10/01/16 1047    Subjective  Pt s/p amputation of right middle finger through tip of middle phalanx   Pertinent  History recent right middle finger tip amputation through the middle phalanx, bilateral transtibial amputations, anemia, asthma, contracture of L knee, decubitus ulcer of sacral region, DM with PVD, ESRD, glaucoma, heart murmur, HTN, hypothyroidism, multiple myeloma, PAD   Patient Stated Goals improve ROM and function in right hand   Currently in Pain? 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 Leanora Cover   Onset Date 08/20/16   Prior Therapy PT     Precautions   Precautions Other (comment)   Precaution Comments No BP on LUE due to dialysis fistula      Home  Environment   Family/patient expects to be discharged to: Private residence   Living Arrangements Children   Type of [place] seat   Lives With Daughter     ADL   Eating/Feeding Modified independent  difficulty holding spoon   ADL comments pt's daughter assists with ADLs prn     Mobility   Mobility Status --  arrived by w/c today, uses walker with walker splint     Edema   Edema moderate edema in right middle finger, pt has a scab in place over fingertip and a wound at thumb webspace with scab in place. Pt's fingertip was dressed with finger stockinette covering fingertip  and bandaide was placed over thumb webspace wound. No drainage or s/s of infection noted. Pt reports perfoming daily finger soaks as instructed by MD.      ROM / Strength   AROM / PROM / Strength AROM     AROM   Overall AROM  Deficits   Overall AROM Comments RUE composite finger flexion grossly 80%,  right middle finger PIP flexion 75*, MP flexion 75*     Strength   Overall Strength Deficits   Overall Strength Comments did not test grip strength due to pain and recent surgery                          OT Education - 10/01/16 1125    Education provided Yes   Education Details Dressing changes and inital HEP for AROM   Person(s) Educated Patient;Child(ren)   Methods Explanation;Demonstration;Verbal cues;Handout   Comprehension Verbalized understanding;Returned demonstration;Verbal cues required          OT Short Term Goals - 10/01/16 1055      OT SHORT TERM GOAL #1   Title I with inital HEP   Time 4   Period Weeks   Status New   Target Date 10/31/16     OT SHORT TERM GOAL #2   Title Pt / caregiver will verbalize understanding of daily dressing changes, wound care and s/s of infection   Time 4   Period Weeks   Status New     OT SHORT TERM GOAL #3   Title Pt/ caregiver will verbalize understanding of AE/ DME in order to maximize pt safety and indepenence with ADLs/ IADLs.   Time 4   Period Weeks   Status New           OT Long Term Goals - 10/01/16 1057      OT LONG TERM GOAL #1   Title I with updated HEP   Time 8   Period Weeks   Status New   Target Date 11/30/16     OT LONG TERM GOAL #2   Title Pt will report using RUE consistently for ADLS at least 09%F for ADLs/ IADLs with pain less than or equal to 2/10.   Time 8   Period Weeks   Status New     OT LONG TERM GOAL #3   Title Pt will demonstrate RUE grip strength of at least 30 lbs for increased Functional use during ADLs/ IADLs.   Time  8   Period Weeks   Status New               Plan - 10-03-2016 1108    Clinical Impression Statement Pt is 71 y.o male with right long finger necrosis with dry gangrene who underwent right long finger amputation through middle phalanx on 08/20/16 by Dr Leanora Cover. Pt presents with pain, decreased ROM, decreased strength, decreased RUE  functional use which impedes performance of ADLs/ IADLs.   Occupational Profile and client history currently impacting functional performance Pt has decreased use of RUE for ADLS/ IADLS, PMH : right long fingertip amputation through middle phalanx, bilateral transtibial amputations, anemia, asthma, contracture of L knee, decubitus ulcer of sacral region, DM with PVD, ESRD, glaucoma, heart murmur, HTN, hypothyroidism, multiple myeloma, PAD   Occupational performance deficits (Please refer to evaluation for details): ADL's;IADL's;Social Participation;Leisure;Rest and Sleep   Rehab Potential Good   Current Impairments/barriers affecting progress: pain   OT Frequency 2x / week  plus eval   OT Duration 8 weeks  may d/c sooner dependent upon pt progress   OT Treatment/Interventions Self-care/ADL training;Moist Heat;Fluidtherapy;DME and/or AE instruction;Splinting;Patient/family education;Therapeutic exercises;Ultrasound;Therapeutic exercise;Therapeutic activities;Cognitive remediation/compensation;Passive range of motion;Functional Mobility Training;Neuromuscular education;Iontophoresis;Cryotherapy;Electrical Stimulation;Energy conservation;Manual Therapy   Plan Progress HEP, review A/ROM   Clinical Decision Making Limited treatment options, no task modification necessary   OT Home Exercise Plan A/ROM HEP   Consulted and Agree with Plan of Care Patient;Family member/caregiver      Patient will benefit from skilled therapeutic intervention in order to improve the following deficits and impairments:  Decreased range of motion, Decreased coordination, Pain, Impaired UE functional use, Decreased knowledge of use of DME, Decreased balance, Decreased mobility, Decreased strength  Visit Diagnosis: Stiffness of joint, hand, right  Other lack of coordination  Muscle weakness (generalized)      G-Codes - 03-Oct-2016 1129    Functional Assessment Tool Used (Outpatient only) clinical impressions 80%  composite finger flexion, limited functional use due to pain and ROM limitations   Functional Limitation Carrying, moving and handling objects   Carrying, Moving and Handling Objects Current Status (M0102) At least 20 percent but less than 40 percent impaired, limited or restricted   Carrying, Moving and Handling Objects Goal Status (V2536) At least 1 percent but less than 20 percent impaired, limited or restricted      Problem List Patient Active Problem List   Diagnosis Date Noted  . Dyslipidemia 11/21/2015  . Diabetic retinopathy ([place])- left eye 2015-10-04  . Complications, amputation stump late ([place]) 05/20/2014  . Weakness generalized 04/07/2014  . Sepsis ([place]) 04/07/2014  . Decubitus ulcer, stage II 04/07/2014  . Decubitus ulcer of ankle   . Fatigue   . Wound infection 04/06/2014  . Decubitus ulcer of sacral region, stage 2 03/07/2014  . Glaucoma 03/07/2014  . Gout 03/07/2014  . Below knee amputation status ([place]) 02/18/2014  . Osteomyelitis ([place]) 01/04/2014  . Phantom limb ([place]) 12/14/2013  . Type 2 diabetes mellitus with diabetic foot infection ([place])   . Diabetes mellitus with peripheral vascular disease ([place])   . Asthma 05/17/2013  . S/P bilateral below knee amputation ([place]) 05/17/2013  . History of MRSA infection 04/22/2013  . Peripheral vascular disease ([place]) 11/19/2012  . End-stage renal disease on hemodialysis ([place]) 05/05/2012  . Kahler disease ([place]) 03/30/2012  . Anemia in chronic kidney disease 03/19/2012  . Hypothyroidism following radioiodine therapy 03/19/2012  . Anemia, iron deficiency 03/19/2012  . Hypertension 03/18/2012  . Chronic kidney disease (CKD),  stage V ([place]) 03/18/2012  . Cardiac conduction disorder 03/18/2012  . MGUS (monoclonal gammopathy of unknown significance) 02/28/2011  . Monoclonal paraproteinemia 02/28/2011    Shayra Anton 10/01/2016, 11:30 AM Theone Murdoch, OTR/L [phone] Phone: [phone] 11:31 AM 10/01/16 [facility] [address] [place], [place], [address] Phone: [phone]   Fax:  [phone]  Name: Jack [name] MRN: [id number] Date of Birth: February 02, 1945

## 2016-10-02 DIAGNOSIS — D631 Anemia in chronic kidney disease: Secondary | ICD-10-CM | POA: Diagnosis not present

## 2016-10-02 DIAGNOSIS — D689 Coagulation defect, unspecified: Secondary | ICD-10-CM | POA: Diagnosis not present

## 2016-10-02 DIAGNOSIS — E1029 Type 1 diabetes mellitus with other diabetic kidney complication: Secondary | ICD-10-CM | POA: Diagnosis not present

## 2016-10-02 DIAGNOSIS — R509 Fever, unspecified: Secondary | ICD-10-CM | POA: Diagnosis not present

## 2016-10-02 DIAGNOSIS — N2581 Secondary hyperparathyroidism of renal origin: Secondary | ICD-10-CM | POA: Diagnosis not present

## 2016-10-02 DIAGNOSIS — N186 End stage renal disease: Secondary | ICD-10-CM | POA: Diagnosis not present

## 2016-10-02 DIAGNOSIS — T82598A Other mechanical complication of other cardiac and vascular devices and implants, initial encounter: Secondary | ICD-10-CM | POA: Diagnosis not present

## 2016-10-03 ENCOUNTER — Encounter: Payer: Self-pay | Admitting: Family Medicine

## 2016-10-03 ENCOUNTER — Encounter: Payer: Medicare Other | Admitting: Physical Therapy

## 2016-10-03 DIAGNOSIS — Z89512 Acquired absence of left leg below knee: Secondary | ICD-10-CM | POA: Diagnosis not present

## 2016-10-03 DIAGNOSIS — Z89511 Acquired absence of right leg below knee: Secondary | ICD-10-CM | POA: Diagnosis not present

## 2016-10-04 DIAGNOSIS — N2581 Secondary hyperparathyroidism of renal origin: Secondary | ICD-10-CM | POA: Diagnosis not present

## 2016-10-04 DIAGNOSIS — T82598A Other mechanical complication of other cardiac and vascular devices and implants, initial encounter: Secondary | ICD-10-CM | POA: Diagnosis not present

## 2016-10-04 DIAGNOSIS — E1029 Type 1 diabetes mellitus with other diabetic kidney complication: Secondary | ICD-10-CM | POA: Diagnosis not present

## 2016-10-04 DIAGNOSIS — R509 Fever, unspecified: Secondary | ICD-10-CM | POA: Diagnosis not present

## 2016-10-04 DIAGNOSIS — D631 Anemia in chronic kidney disease: Secondary | ICD-10-CM | POA: Diagnosis not present

## 2016-10-04 DIAGNOSIS — N186 End stage renal disease: Secondary | ICD-10-CM | POA: Diagnosis not present

## 2016-10-04 DIAGNOSIS — D689 Coagulation defect, unspecified: Secondary | ICD-10-CM | POA: Diagnosis not present

## 2016-10-07 DIAGNOSIS — R509 Fever, unspecified: Secondary | ICD-10-CM | POA: Diagnosis not present

## 2016-10-07 DIAGNOSIS — D631 Anemia in chronic kidney disease: Secondary | ICD-10-CM | POA: Diagnosis not present

## 2016-10-07 DIAGNOSIS — E1029 Type 1 diabetes mellitus with other diabetic kidney complication: Secondary | ICD-10-CM | POA: Diagnosis not present

## 2016-10-07 DIAGNOSIS — N2581 Secondary hyperparathyroidism of renal origin: Secondary | ICD-10-CM | POA: Diagnosis not present

## 2016-10-07 DIAGNOSIS — T82598A Other mechanical complication of other cardiac and vascular devices and implants, initial encounter: Secondary | ICD-10-CM | POA: Diagnosis not present

## 2016-10-07 DIAGNOSIS — N186 End stage renal disease: Secondary | ICD-10-CM | POA: Diagnosis not present

## 2016-10-07 DIAGNOSIS — D689 Coagulation defect, unspecified: Secondary | ICD-10-CM | POA: Diagnosis not present

## 2016-10-08 ENCOUNTER — Ambulatory Visit: Payer: Medicare Other | Admitting: Occupational Therapy

## 2016-10-08 ENCOUNTER — Ambulatory Visit: Payer: Medicare Other | Admitting: Physical Therapy

## 2016-10-08 DIAGNOSIS — M6281 Muscle weakness (generalized): Secondary | ICD-10-CM | POA: Diagnosis not present

## 2016-10-08 DIAGNOSIS — M6249 Contracture of muscle, multiple sites: Secondary | ICD-10-CM | POA: Diagnosis not present

## 2016-10-08 DIAGNOSIS — M25641 Stiffness of right hand, not elsewhere classified: Secondary | ICD-10-CM | POA: Diagnosis not present

## 2016-10-08 DIAGNOSIS — R278 Other lack of coordination: Secondary | ICD-10-CM

## 2016-10-08 DIAGNOSIS — R2681 Unsteadiness on feet: Secondary | ICD-10-CM | POA: Diagnosis not present

## 2016-10-08 DIAGNOSIS — R2689 Other abnormalities of gait and mobility: Secondary | ICD-10-CM | POA: Diagnosis not present

## 2016-10-08 NOTE — Therapy (Signed)
[facility] [address] [place], [place], [address] Phone: [phone]   Fax:  [phone]  Occupational Therapy Treatment  Patient Details  Name: Jack [name] MRN: [id number] Date of Birth: 01/28/1945 Referring Provider: Dr. Leanora Cover  Encounter Date: 10/08/2016      OT End of Session - 10/08/16 1053    Visit Number 2   Number of Visits 17   Date for OT Re-Evaluation 11/30/16   Authorization Type [employer] MCR   Authorization - Visit Number 2   Authorization - Number of Visits 10   OT Start Time [phone]   OT Stop Time 0930   OT Time Calculation (min) 27 min   Activity Tolerance Patient tolerated treatment well   Behavior During Therapy [facility] for tasks assessed/performed      Past Medical History:  Diagnosis Date  . Anemia   . Asthma   . Contracture of joint    left knee  . Decubitus ulcer of sacral region, stage 2 03/07/2014  . Diabetes mellitus with peripheral vascular disease ([place])   . End-stage renal disease on hemodialysis [facility])    Started HD March 2014.  Cause of ESRD was DM.  Gets HD at [facility] on [address] on MWF schedule.   Marland Kitchen ESRD on hemodialysis ([place])   . Gangrene ([place])    right BKA  . Gangrene of foot ([place])   . Glaucoma   . Glaucoma 03/07/2014  . Heart murmur   . History of MRSA infection 04/22/2013   Bacteremia assoc w L foot wound infection Mar 2015   . Hyperparathyroidism, secondary renal ([place])   . Hypertension   . Hypothyroidism   . MRSA bacteremia   . Multiple myeloma, without mention of having achieved remission 03/30/2012   Cytogenetic neg on 03/23/2012.  Marland Kitchen Peripheral arterial disease ([place])   . Peripheral vascular disease, unspecified ([place]) 11/19/2012   In the past had R foot toe amps then R TMA. In 2015 had left foot toe amp > then TMA >then L BKA on 05/14/13   . Pneumonia    2012  . Thyroid disease    hyperparathyroidism    Past Surgical History:  Procedure Laterality Date  .  ABDOMINAL AORTAGRAM Bilateral 11/06/2012   Procedure: ABDOMINAL AORTAGRAM;  Surgeon: Elam Dutch, MD;  Location: [facility] LAB;  Service: Cardiovascular;  Laterality: Bilateral;  . AMPUTATION Right 11/10/2012   Procedure: AMPUTATION FIRST and SECOND TOES Right Foot;  Surgeon: Elam Dutch, MD;  Location: [place];  Service: Vascular;  Laterality: Right;  . AMPUTATION Left 04/07/2013   Procedure: AMPUTATION DIGIT- LEFT 1ST TOE;  Surgeon: Mal Misty, MD;  Location: [place];  Service: Vascular;  Laterality: Left;  . AMPUTATION Left 04/26/2013   Procedure: Left Foot Transmetatarsal Amputation;  Surgeon: Newt Minion, MD;  Location: [place];  Service: Orthopedics;  Laterality: Left;  . AMPUTATION Left 05/14/2013   Procedure: AMPUTATION BELOW KNEE;  Surgeon: Newt Minion, MD;  Location: [place];  Service: Orthopedics;  Laterality: Left;  Left Below Knee Amputation  . AMPUTATION Left 07/23/2013   Procedure: AMPUTATION BELOW KNEE;  Surgeon: Newt Minion, MD;  Location: [place];  Service: Orthopedics;  Laterality: Left;  Left Below Knee Amputation Revision  . AMPUTATION Right 02/18/2014   Procedure: AMPUTATION BELOW KNEE;  Surgeon: Newt Minion, MD;  Location: [place];  Service: Orthopedics;  Laterality: Right;  . AMPUTATION Right 08/20/2016   Procedure: RIGHT LONG FINGER  AMPUTATION;  Surgeon: Leanora Cover, MD;  Location: [place];  Service: Orthopedics;  Laterality: Right;  . AV FISTULA PLACEMENT Left 03/25/2012   Procedure: ARTERIOVENOUS (AV) FISTULA CREATION;  Surgeon: Mal Misty, MD;  Location: [place];  Service: Vascular;  Laterality: Left;  . BELOW KNEE LEG AMPUTATION Left 05/14/2013   DR [name]  . BELOW KNEE LEG AMPUTATION Right 02/18/2014   dr duda  . CARDIAC CATHETERIZATION     approx 30 years ago  . CERVICAL DISC SURGERY    . EYE SURGERY Bilateral    CATARACTS  . I&D EXTREMITY Left 04/22/2013   Procedure: IRRIGATION AND DEBRIDEMENT LEFT FIRST TOE AMPUTATION WOUND ;  Surgeon: Mal Misty, MD;  Location: [place];  Service: Vascular;  Laterality: Left;  . I&D EXTREMITY Right 01/05/2014   Procedure: IRRIGATION AND DEBRIDEMENT Right Heel Ulcer;  Surgeon: Mcarthur Rossetti, MD;  Location: [place];  Service: Orthopedics;  Laterality: Right;  Surgeon Available after 5PM  . INSERTION OF DIALYSIS CATHETER Right 03/19/2012   Procedure: INSERTION OF DIALYSIS CATHETER;  Surgeon: Mal Misty, MD;  Location: [place];  Service: Vascular;  Laterality: Right;  Right Internal Jugular  . LIGATION OF COMPETING BRANCHES OF ARTERIOVENOUS FISTULA Left 05/08/2012   Procedure: LIGATION OF COMPETING BRANCHES OF ARTERIOVENOUS FISTULA;  Surgeon: Mal Misty, MD;  Location: [place];  Service: Vascular;  Laterality: Left;  Ultrasound guided  . REPAIR QUADRICEPS/HAMSTRING MUSCLES Left 05/20/2014   Procedure: Left Hamstring Release;  Surgeon: Newt Minion, MD;  Location: [place];  Service: Orthopedics;  Laterality: Left;  . REVISON OF ARTERIOVENOUS FISTULA Left 01/30/2016   Procedure: CEPHALIC VEIN TURNDOWN TO LEFT UPPER ARM;  Surgeon: Elam Dutch, MD;  Location: [place];  Service: Vascular;  Laterality: Left;  . STUMP REVISION Right 05/20/2014   Procedure: Revision Right Below Knee Amputation;  Surgeon: Newt Minion, MD;  Location: [place];  Service: Orthopedics;  Laterality: Right;  . TEE WITHOUT CARDIOVERSION N/A 04/20/2013   Procedure: TRANSESOPHAGEAL ECHOCARDIOGRAM (TEE);  Surgeon: Josue Hector, MD;  Location: [facility] ENDOSCOPY;  Service: Cardiovascular;  Laterality: N/A;  . THYROIDECTOMY    . TOE AMPUTATION     D/C 04-30-13  . TRANSMETATARSAL AMPUTATION Left 12/16/2012   Procedure: TRANSMETATARSAL AMPUTATION AND VAC PLACEMENT;  Surgeon: Elam Dutch, MD;  Location: [place];  Service: Vascular;  Laterality: Left;    There were no vitals filed for this visit.      Subjective Assessment - 10/08/16 1100    Subjective  Pt reports increased pain   Pertinent History recent right middle finger tip  amputation through the middle phalanx, bilateral transtibial amputations, anemia, asthma, contracture of L knee, decubitus ulcer of sacral region, DM with PVD, ESRD, glaucoma, heart murmur, HTN, hypothyroidism, multiple myeloma, PAD   Patient Stated Goals improve ROM and function in right hand   Currently in Pain? Yes   Pain Score 5    Pain Location Finger (Comment which one)   Pain Orientation Right   Pain Descriptors / Indicators Aching   Pain Type Acute pain   Pain Onset 1 to 4 weeks ago   Pain Frequency Constant   Aggravating Factors  touch, pressure   Pain Relieving Factors rest   Effect of Pain on Daily Activities difficulty using right hand           Treatment: Pt arrived with protective dressing over finger tip. Dressing was removed, and therapist noted increased swelling,  redness, bleeding from fingertip and pt reports increased pain. Pt's hand was cleaned with soap and water and dried throughly. Fingertip was dressed with stockinette, and coban. Band aide applied to healing web space wound. Pt perfromed gentle A/ROM finger flexion, PIP flexion and MP flexion with RUE, while therapist called MD. Therapist called MD office and pt was instructed to be seen today.                     OT Short Term Goals - 10/01/16 1055      OT SHORT TERM GOAL #1   Title I with inital HEP   Time 4   Period Weeks   Status New   Target Date 10/31/16     OT SHORT TERM GOAL #2   Title Pt / caregiver will verbalize understanding of daily dressing changes, wound care and s/s of infection   Time 4   Period Weeks   Status New     OT SHORT TERM GOAL #3   Title Pt/ caregiver will verbalize understanding of AE/ DME in order to maximize pt safety and indepenence with ADLs/ IADLs.   Time 4   Period Weeks   Status New     OT SHORT TERM GOAL #4   Title Pt will resume performance of all basic ADLs at a modified independent level.   Time 4   Period Weeks   Status New     OT  SHORT TERM GOAL #5   Title Pt will demonstrate adequate RUE strength in order to donn prosthesis/ liners independently.   Time 4   Period Weeks   Status New           OT Long Term Goals - 10/01/16 1057      OT LONG TERM GOAL #1   Title I with updated HEP   Time 8   Period Weeks   Status New   Target Date 11/30/16     OT LONG TERM GOAL #2   Title Pt will report using RUE consistently for ADLS at least 42%H for ADLs/ IADLs with pain less than or equal to 2/10.   Time 8   Period Weeks   Status New     OT LONG TERM GOAL #3   Title Pt will demonstrate RUE grip strength of at least 30 lbs for increased Functional use during ADLs/ IADLs.   Time 8   Period Weeks   Status New     OT LONG TERM GOAL #4   Title Pt will resume performance of light home management/ cooking at a modified independent level   Time 8   Period Weeks   Status New               Plan - 10/08/16 1053    Clinical Impression Statement Pt arrived today with his hand wrpped in finger stockinette and coban. Pt presents with increased redness, hand pain, warmth and swelling. Therapist called MD office and Pt's nurse Jeani Hawking recommended pt be seen today.   Rehab Potential Good   Current Impairments/barriers affecting progress: pain   OT Frequency 2x / week   OT Duration 8 weeks   OT Treatment/Interventions Self-care/ADL training;Moist Heat;Fluidtherapy;DME and/or AE instruction;Splinting;Patient/family education;Therapeutic exercises;Ultrasound;Therapeutic exercise;Therapeutic activities;Cognitive remediation/compensation;Passive range of motion;Functional Mobility Training;Neuromuscular education;Iontophoresis;Cryotherapy;Electrical Stimulation;Energy conservation;Manual Therapy   Plan progress as able,    OT Home Exercise Plan A/ROM HEP   Consulted and Agree with Plan of Care Patient;Family member/caregiver      Patient  will benefit from skilled therapeutic intervention in order to improve the following  deficits and impairments:  Decreased range of motion, Decreased coordination, Pain, Impaired UE functional use, Decreased knowledge of use of DME, Decreased balance, Decreased mobility, Decreased strength  Visit Diagnosis: Stiffness of joint, hand, right  Other lack of coordination  Muscle weakness (generalized)    Problem List Patient Active Problem List   Diagnosis Date Noted  . Dyslipidemia 11/21/2015  . Diabetic retinopathy ([place])- left eye 10/02/2015  . Complications, amputation stump late ([place]) 05/20/2014  . Weakness generalized 04/07/2014  . Sepsis ([place]) 04/07/2014  . Decubitus ulcer, stage II 04/07/2014  . Decubitus ulcer of ankle   . Fatigue   . Wound infection 04/06/2014  . Decubitus ulcer of sacral region, stage 2 03/07/2014  . Glaucoma 03/07/2014  . Gout 03/07/2014  . Below knee amputation status ([place]) 02/18/2014  . Osteomyelitis ([place]) 01/04/2014  . Phantom limb ([place]) 12/14/2013  . Type 2 diabetes mellitus with diabetic foot infection ([place])   . Diabetes mellitus with peripheral vascular disease ([place])   . Asthma 05/17/2013  . S/P bilateral below knee amputation ([place]) 05/17/2013  . History of MRSA infection 04/22/2013  . Peripheral vascular disease ([place]) 11/19/2012  . End-stage renal disease on hemodialysis ([place]) 05/05/2012  . Kahler disease ([place]) 03/30/2012  . Anemia in chronic kidney disease 03/19/2012  . Hypothyroidism following radioiodine therapy 03/19/2012  . Anemia, iron deficiency 03/19/2012  . Hypertension 03/18/2012  . Chronic kidney disease (CKD), stage V ([place]) 03/18/2012  . Cardiac conduction disorder 03/18/2012  . MGUS (monoclonal gammopathy of unknown significance) 02/28/2011  . Monoclonal paraproteinemia 02/28/2011    Anastasya Jewell 10/08/2016, 11:01 AM  [facility] [address] [place], [place], [address] Phone: [phone]   Fax:  [phone]  Name: Jack [name] MRN:  [id number] Date of Birth: [birth year]

## 2016-10-09 DIAGNOSIS — E1029 Type 1 diabetes mellitus with other diabetic kidney complication: Secondary | ICD-10-CM | POA: Diagnosis not present

## 2016-10-09 DIAGNOSIS — D631 Anemia in chronic kidney disease: Secondary | ICD-10-CM | POA: Diagnosis not present

## 2016-10-09 DIAGNOSIS — R509 Fever, unspecified: Secondary | ICD-10-CM | POA: Diagnosis not present

## 2016-10-09 DIAGNOSIS — T82598A Other mechanical complication of other cardiac and vascular devices and implants, initial encounter: Secondary | ICD-10-CM | POA: Diagnosis not present

## 2016-10-09 DIAGNOSIS — N186 End stage renal disease: Secondary | ICD-10-CM | POA: Diagnosis not present

## 2016-10-09 DIAGNOSIS — N2581 Secondary hyperparathyroidism of renal origin: Secondary | ICD-10-CM | POA: Diagnosis not present

## 2016-10-09 DIAGNOSIS — D689 Coagulation defect, unspecified: Secondary | ICD-10-CM | POA: Diagnosis not present

## 2016-10-10 ENCOUNTER — Ambulatory Visit (INDEPENDENT_AMBULATORY_CARE_PROVIDER_SITE_OTHER): Payer: Medicare Other | Admitting: Vascular Surgery

## 2016-10-10 ENCOUNTER — Encounter: Payer: Self-pay | Admitting: Vascular Surgery

## 2016-10-10 VITALS — BP 147/67 | HR 68 | Temp 98.3°F | Resp 20 | Ht 70.0 in | Wt 163.0 lb

## 2016-10-10 DIAGNOSIS — Z992 Dependence on renal dialysis: Secondary | ICD-10-CM

## 2016-10-10 DIAGNOSIS — N186 End stage renal disease: Secondary | ICD-10-CM

## 2016-10-10 NOTE — Progress Notes (Signed)
Patient is a 71 year old male who returns for follow-up today regarding hemodialysis access.  He has had a prior left brachiocephalic AV fistula which failed. It was initially narrowed in the cephalic subclavian junction and then occluded after a cephalic vein turndown. He is currently dialyzing via a catheter. We were considering a brachiocephalic fistula in the right arm however he has had a nonhealing wound of his right third finger which again has failed to heal at this point. He had amputation of the midportion of the finger by Dr. Fredna Dow proximally July of this year. He currently dialyzes Monday Wednesday Friday.  He has previously undergone bilateral below-knee amputations. He has known lower extremity peripheral arterial disease.  Review of systems: He denies fever or chills. He denies shortness of breath.  Past Surgical History:  Procedure Laterality Date  . ABDOMINAL AORTAGRAM Bilateral 11/06/2012   Procedure: ABDOMINAL AORTAGRAM;  Surgeon: Elam Dutch, MD;  Location: [facility] CATH LAB;  Service: Cardiovascular;  Laterality: Bilateral;  . AMPUTATION Right 11/10/2012   Procedure: AMPUTATION FIRST and SECOND TOES Right Foot;  Surgeon: Elam Dutch, MD;  Location: [place];  Service: Vascular;  Laterality: Right;  . AMPUTATION Left 04/07/2013   Procedure: AMPUTATION DIGIT- LEFT 1ST TOE;  Surgeon: Mal Misty, MD;  Location: [place];  Service: Vascular;  Laterality: Left;  . AMPUTATION Left 04/26/2013   Procedure: Left Foot Transmetatarsal Amputation;  Surgeon: Newt Minion, MD;  Location: [place];  Service: Orthopedics;  Laterality: Left;  . AMPUTATION Left 05/14/2013   Procedure: AMPUTATION BELOW KNEE;  Surgeon: Newt Minion, MD;  Location: [place];  Service: Orthopedics;  Laterality: Left;  Left Below Knee Amputation  . AMPUTATION Left 07/23/2013   Procedure: AMPUTATION BELOW KNEE;  Surgeon: Newt Minion, MD;  Location: [place];  Service: Orthopedics;  Laterality: Left;  Left Below Knee Amputation  Revision  . AMPUTATION Right 02/18/2014   Procedure: AMPUTATION BELOW KNEE;  Surgeon: Newt Minion, MD;  Location: [place];  Service: Orthopedics;  Laterality: Right;  . AMPUTATION Right 08/20/2016   Procedure: RIGHT LONG FINGER AMPUTATION;  Surgeon: Leanora Cover, MD;  Location: [place];  Service: Orthopedics;  Laterality: Right;  . AV FISTULA PLACEMENT Left 03/25/2012   Procedure: ARTERIOVENOUS (AV) FISTULA CREATION;  Surgeon: Mal Misty, MD;  Location: [place];  Service: Vascular;  Laterality: Left;  . BELOW KNEE LEG AMPUTATION Left 05/14/2013   DR [name]  . BELOW KNEE LEG AMPUTATION Right 02/18/2014   dr duda  . CARDIAC CATHETERIZATION     approx 30 years ago  . CERVICAL DISC SURGERY    . EYE SURGERY Bilateral    CATARACTS  . I&D EXTREMITY Left 04/22/2013   Procedure: IRRIGATION AND DEBRIDEMENT LEFT FIRST TOE AMPUTATION WOUND ;  Surgeon: Mal Misty, MD;  Location: [place];  Service: Vascular;  Laterality: Left;  . I&D EXTREMITY Right 01/05/2014   Procedure: IRRIGATION AND DEBRIDEMENT Right Heel Ulcer;  Surgeon: Mcarthur Rossetti, MD;  Location: [place];  Service: Orthopedics;  Laterality: Right;  Surgeon Available after 5PM  . INSERTION OF DIALYSIS CATHETER Right 03/19/2012   Procedure: INSERTION OF DIALYSIS CATHETER;  Surgeon: Mal Misty, MD;  Location: [place];  Service: Vascular;  Laterality: Right;  Right Internal Jugular  . LIGATION OF COMPETING BRANCHES OF ARTERIOVENOUS FISTULA Left 05/08/2012   Procedure: LIGATION OF COMPETING BRANCHES OF ARTERIOVENOUS FISTULA;  Surgeon: Mal Misty, MD;  Location: [place];  Service: Vascular;  Laterality: Left;  Ultrasound guided  . REPAIR QUADRICEPS/HAMSTRING MUSCLES Left 05/20/2014   Procedure: Left Hamstring Release;  Surgeon: Newt Minion, MD;  Location: [place];  Service: Orthopedics;  Laterality: Left;  . REVISON OF ARTERIOVENOUS FISTULA Left 01/30/2016   Procedure: CEPHALIC VEIN TURNDOWN TO LEFT UPPER ARM;  Surgeon: Elam Dutch, MD;  Location: [place];  Service: Vascular;  Laterality: Left;  . STUMP REVISION Right 05/20/2014   Procedure: Revision Right Below Knee Amputation;  Surgeon: Newt Minion, MD;  Location: [place];  Service: Orthopedics;  Laterality: Right;  . TEE WITHOUT CARDIOVERSION N/A 04/20/2013   Procedure: TRANSESOPHAGEAL ECHOCARDIOGRAM (TEE);  Surgeon: Josue Hector, MD;  Location: [facility] ENDOSCOPY;  Service: Cardiovascular;  Laterality: N/A;  . THYROIDECTOMY    . TOE AMPUTATION     D/C 04-30-13  . TRANSMETATARSAL AMPUTATION Left 12/16/2012   Procedure: TRANSMETATARSAL AMPUTATION AND VAC PLACEMENT;  Surgeon: Elam Dutch, MD;  Location: [facility];  Service: Vascular;  Laterality: Left;   Physical exam:  Vitals:   10/10/16 0913  BP: (!) 147/67  Pulse: 68  Resp: 20  Temp: 98.3 F (36.8 C)  TempSrc: Oral  SpO2: 100%  Weight: 163 lb (73.9 kg)  Height: 5\' 10"  (1.778 m)    Right upper extremity: 2+ brachial and radial pulse still with 2 cm area of dark eschar at the amputation site of the right third finger.  Assessment: Patient with nonhealing wound right arm would not consider placing the fistula in the right arm until this is completely healed. He currently is dialyzing via a catheter but obviously this is at high risk of infection. I would not consider a lower extremity leg graft unless absolutely necessary due to his peripheral arterial disease and previous below-knee amputations. If his finger does not heal within the next couple months then I guess consideration would need to be given for going back to the left arm and he would need a central venogram prior to that because of the multiple prior procedures in the left upper extremity.  Plan: The patient will call for an appointment to talk about dialysis access again when his finger is healed.  [name], MD [facility] Office: [phone] Pager: [phone]

## 2016-10-11 DIAGNOSIS — T82598A Other mechanical complication of other cardiac and vascular devices and implants, initial encounter: Secondary | ICD-10-CM | POA: Diagnosis not present

## 2016-10-11 DIAGNOSIS — N2581 Secondary hyperparathyroidism of renal origin: Secondary | ICD-10-CM | POA: Diagnosis not present

## 2016-10-11 DIAGNOSIS — D689 Coagulation defect, unspecified: Secondary | ICD-10-CM | POA: Diagnosis not present

## 2016-10-11 DIAGNOSIS — N186 End stage renal disease: Secondary | ICD-10-CM | POA: Diagnosis not present

## 2016-10-11 DIAGNOSIS — E1029 Type 1 diabetes mellitus with other diabetic kidney complication: Secondary | ICD-10-CM | POA: Diagnosis not present

## 2016-10-11 DIAGNOSIS — R509 Fever, unspecified: Secondary | ICD-10-CM | POA: Diagnosis not present

## 2016-10-11 DIAGNOSIS — D631 Anemia in chronic kidney disease: Secondary | ICD-10-CM | POA: Diagnosis not present

## 2016-10-13 DIAGNOSIS — Z992 Dependence on renal dialysis: Secondary | ICD-10-CM | POA: Diagnosis not present

## 2016-10-13 DIAGNOSIS — C9000 Multiple myeloma not having achieved remission: Secondary | ICD-10-CM | POA: Diagnosis not present

## 2016-10-13 DIAGNOSIS — N186 End stage renal disease: Secondary | ICD-10-CM | POA: Diagnosis not present

## 2016-10-14 DIAGNOSIS — D689 Coagulation defect, unspecified: Secondary | ICD-10-CM | POA: Diagnosis not present

## 2016-10-14 DIAGNOSIS — D631 Anemia in chronic kidney disease: Secondary | ICD-10-CM | POA: Diagnosis not present

## 2016-10-14 DIAGNOSIS — E1029 Type 1 diabetes mellitus with other diabetic kidney complication: Secondary | ICD-10-CM | POA: Diagnosis not present

## 2016-10-14 DIAGNOSIS — N186 End stage renal disease: Secondary | ICD-10-CM | POA: Diagnosis not present

## 2016-10-14 DIAGNOSIS — T82598A Other mechanical complication of other cardiac and vascular devices and implants, initial encounter: Secondary | ICD-10-CM | POA: Diagnosis not present

## 2016-10-14 DIAGNOSIS — N2581 Secondary hyperparathyroidism of renal origin: Secondary | ICD-10-CM | POA: Diagnosis not present

## 2016-10-16 DIAGNOSIS — D631 Anemia in chronic kidney disease: Secondary | ICD-10-CM | POA: Diagnosis not present

## 2016-10-16 DIAGNOSIS — N2581 Secondary hyperparathyroidism of renal origin: Secondary | ICD-10-CM | POA: Diagnosis not present

## 2016-10-16 DIAGNOSIS — T82598A Other mechanical complication of other cardiac and vascular devices and implants, initial encounter: Secondary | ICD-10-CM | POA: Diagnosis not present

## 2016-10-16 DIAGNOSIS — E1029 Type 1 diabetes mellitus with other diabetic kidney complication: Secondary | ICD-10-CM | POA: Diagnosis not present

## 2016-10-16 DIAGNOSIS — N186 End stage renal disease: Secondary | ICD-10-CM | POA: Diagnosis not present

## 2016-10-16 DIAGNOSIS — D689 Coagulation defect, unspecified: Secondary | ICD-10-CM | POA: Diagnosis not present

## 2016-10-17 ENCOUNTER — Ambulatory Visit: Payer: Medicare Other | Attending: Orthopedic Surgery | Admitting: Physical Therapy

## 2016-10-17 ENCOUNTER — Ambulatory Visit: Payer: Medicare Other | Admitting: Occupational Therapy

## 2016-10-17 ENCOUNTER — Encounter: Payer: Self-pay | Admitting: Physical Therapy

## 2016-10-17 DIAGNOSIS — R2689 Other abnormalities of gait and mobility: Secondary | ICD-10-CM | POA: Insufficient documentation

## 2016-10-17 DIAGNOSIS — M6281 Muscle weakness (generalized): Secondary | ICD-10-CM | POA: Diagnosis not present

## 2016-10-17 DIAGNOSIS — M25641 Stiffness of right hand, not elsewhere classified: Secondary | ICD-10-CM | POA: Insufficient documentation

## 2016-10-17 DIAGNOSIS — R278 Other lack of coordination: Secondary | ICD-10-CM

## 2016-10-17 DIAGNOSIS — R2681 Unsteadiness on feet: Secondary | ICD-10-CM | POA: Diagnosis not present

## 2016-10-17 NOTE — Therapy (Signed)
[facility] [address] [place], [place], [address] Phone: [phone]   Fax:  [phone]  Occupational Therapy Treatment  Patient Details  Name: Jack [name] MRN: [id number] Date of Birth: May 04, 1945 Referring Provider: Dr. Leanora Cover  Encounter Date: 10/17/2016      OT End of Session - 10/17/16 1211    Visit Number 3   Number of Visits 17   Date for OT Re-Evaluation 11/30/16   Authorization - Visit Number 3   Authorization - Number of Visits 10   OT Start Time 0805   OT Stop Time 0845   OT Time Calculation (min) 40 min   Activity Tolerance Patient tolerated treatment well   Behavior During Therapy [facility] for tasks assessed/performed      Past Medical History:  Diagnosis Date  . Anemia   . Asthma   . Contracture of joint    left knee  . Decubitus ulcer of sacral region, stage 2 03/07/2014  . Diabetes mellitus with peripheral vascular disease ([place])   . End-stage renal disease on hemodialysis [facility])    Started HD March 2014.  Cause of ESRD was DM.  Gets HD at [facility] on [place] on MWF schedule.   Marland Kitchen ESRD on hemodialysis ([place])   . Gangrene ([place])    right BKA  . Gangrene of foot ([place])   . Glaucoma   . Glaucoma 03/07/2014  . Heart murmur   . History of MRSA infection 04/22/2013   Bacteremia assoc w L foot wound infection Mar 2015   . Hyperparathyroidism, secondary renal ([place])   . Hypertension   . Hypothyroidism   . MRSA bacteremia   . Multiple myeloma, without mention of having achieved remission 03/30/2012   Cytogenetic neg on 03/23/2012.  Marland Kitchen Peripheral arterial disease ([place])   . Peripheral vascular disease, unspecified ([place]) 11/19/2012   In the past had R foot toe amps then R TMA. In 2015 had left foot toe amp > then TMA >then L BKA on 05/14/13   . Pneumonia    2012  . Thyroid disease    hyperparathyroidism    Past Surgical History:  Procedure Laterality Date  . ABDOMINAL AORTAGRAM Bilateral  11/06/2012   Procedure: ABDOMINAL AORTAGRAM;  Surgeon: Elam Dutch, MD;  Location: [facility] CATH LAB;  Service: Cardiovascular;  Laterality: Bilateral;  . AMPUTATION Right 11/10/2012   Procedure: AMPUTATION FIRST and SECOND TOES Right Foot;  Surgeon: Elam Dutch, MD;  Location: [place];  Service: Vascular;  Laterality: Right;  . AMPUTATION Left 04/07/2013   Procedure: AMPUTATION DIGIT- LEFT 1ST TOE;  Surgeon: Mal Misty, MD;  Location: [place];  Service: Vascular;  Laterality: Left;  . AMPUTATION Left 04/26/2013   Procedure: Left Foot Transmetatarsal Amputation;  Surgeon: Newt Minion, MD;  Location: [place];  Service: Orthopedics;  Laterality: Left;  . AMPUTATION Left 05/14/2013   Procedure: AMPUTATION BELOW KNEE;  Surgeon: Newt Minion, MD;  Location: [place];  Service: Orthopedics;  Laterality: Left;  Left Below Knee Amputation  . AMPUTATION Left 07/23/2013   Procedure: AMPUTATION BELOW KNEE;  Surgeon: Newt Minion, MD;  Location: [place];  Service: Orthopedics;  Laterality: Left;  Left Below Knee Amputation Revision  . AMPUTATION Right 02/18/2014   Procedure: AMPUTATION BELOW KNEE;  Surgeon: Newt Minion, MD;  Location: [place];  Service: Orthopedics;  Laterality: Right;  . AMPUTATION Right 08/20/2016   Procedure: RIGHT LONG FINGER AMPUTATION;  Surgeon: Leanora Cover, MD;  Location: [place];  Service: Orthopedics;  Laterality: Right;  . AV FISTULA PLACEMENT Left 03/25/2012   Procedure: ARTERIOVENOUS (AV) FISTULA CREATION;  Surgeon: Mal Misty, MD;  Location: [place];  Service: Vascular;  Laterality: Left;  . BELOW KNEE LEG AMPUTATION Left 05/14/2013   DR [name]  . BELOW KNEE LEG AMPUTATION Right 02/18/2014   dr duda  . CARDIAC CATHETERIZATION     approx 30 years ago  . CERVICAL DISC SURGERY    . EYE SURGERY Bilateral    CATARACTS  . I&D EXTREMITY Left 04/22/2013   Procedure: IRRIGATION AND DEBRIDEMENT LEFT FIRST TOE AMPUTATION WOUND ;  Surgeon: Mal Misty, MD;  Location: [place];  Service: Vascular;  Laterality: Left;  . I&D EXTREMITY Right 01/05/2014   Procedure: IRRIGATION AND DEBRIDEMENT Right Heel Ulcer;  Surgeon: Mcarthur Rossetti, MD;  Location: [place];  Service: Orthopedics;  Laterality: Right;  Surgeon Available after 5PM  . INSERTION OF DIALYSIS CATHETER Right 03/19/2012   Procedure: INSERTION OF DIALYSIS CATHETER;  Surgeon: Mal Misty, MD;  Location: [place];  Service: Vascular;  Laterality: Right;  Right Internal Jugular  . LIGATION OF COMPETING BRANCHES OF ARTERIOVENOUS FISTULA Left 05/08/2012   Procedure: LIGATION OF COMPETING BRANCHES OF ARTERIOVENOUS FISTULA;  Surgeon: Mal Misty, MD;  Location: [place];  Service: Vascular;  Laterality: Left;  Ultrasound guided  . REPAIR QUADRICEPS/HAMSTRING MUSCLES Left 05/20/2014   Procedure: Left Hamstring Release;  Surgeon: Newt Minion, MD;  Location: [place];  Service: Orthopedics;  Laterality: Left;  . REVISON OF ARTERIOVENOUS FISTULA Left 01/30/2016   Procedure: CEPHALIC VEIN TURNDOWN TO LEFT UPPER ARM;  Surgeon: Elam Dutch, MD;  Location: [place];  Service: Vascular;  Laterality: Left;  . STUMP REVISION Right 05/20/2014   Procedure: Revision Right Below Knee Amputation;  Surgeon: Newt Minion, MD;  Location: [place];  Service: Orthopedics;  Laterality: Right;  . TEE WITHOUT CARDIOVERSION N/A 04/20/2013   Procedure: TRANSESOPHAGEAL ECHOCARDIOGRAM (TEE);  Surgeon: Josue Hector, MD;  Location: [facility] ENDOSCOPY;  Service: Cardiovascular;  Laterality: N/A;  . THYROIDECTOMY    . TOE AMPUTATION     D/C 04-30-13  . TRANSMETATARSAL AMPUTATION Left 12/16/2012   Procedure: TRANSMETATARSAL AMPUTATION AND VAC PLACEMENT;  Surgeon: Elam Dutch, MD;  Location: [place];  Service: Vascular;  Laterality: Left;    There were no vitals filed for this visit.      Subjective Assessment - 10/17/16 0824    Pertinent History recent right middle finger tip amputation through the middle phalanx, bilateral transtibial amputations,  anemia, asthma, contracture of L knee, decubitus ulcer of sacral region, DM with PVD, ESRD, glaucoma, heart murmur, HTN, hypothyroidism, multiple myeloma, PAD   Patient Stated Goals improve ROM and function in right hand   Currently in Pain? Yes   Pain Score 5    Pain Location Finger (Comment which one)   Pain Orientation Right   Pain Descriptors / Indicators Aching   Pain Type Acute pain   Pain Onset 1 to 4 weeks ago   Pain Frequency Constant   Aggravating Factors  pressure   Pain Relieving Factors rest                  Treatment: Pt's hand was cleaned with soap and water avoiding finger tip and dried throughly. Pt has small scab at fingertip with new pink skin around edges. Small amount of bloody drainage. Fingertip was dressed with stockinette, and  coban. Band aide applied to healing web space wound. Pt performed gentle A/ROM finger flexion, PIP flexion and MP flexion with RUE. Picking up checkers with RUE using index and middle finger to place in connect four frame and to stack, min-mod v.c for functional use.               OT Short Term Goals - 10/01/16 1055      OT SHORT TERM GOAL #1   Title I with inital HEP   Time 4   Period Weeks   Status New   Target Date 10/31/16     OT SHORT TERM GOAL #2   Title Pt / caregiver will verbalize understanding of daily dressing changes, wound care and s/s of infection   Time 4   Period Weeks   Status New     OT SHORT TERM GOAL #3   Title Pt/ caregiver will verbalize understanding of AE/ DME in order to maximize pt safety and indepenence with ADLs/ IADLs.   Time 4   Period Weeks   Status New     OT SHORT TERM GOAL #4   Title Pt will resume performance of all basic ADLs at a modified independent level.   Time 4   Period Weeks   Status New     OT SHORT TERM GOAL #5   Title Pt will demonstrate adequate RUE strength in order to donn prosthesis/ liners independently.   Time 4   Period Weeks   Status New            OT Long Term Goals - 10/01/16 1057      OT LONG TERM GOAL #1   Title I with updated HEP   Time 8   Period Weeks   Status New   Target Date 11/30/16     OT LONG TERM GOAL #2   Title Pt will report using RUE consistently for ADLS at least 87%F for ADLs/ IADLs with pain less than or equal to 2/10.   Time 8   Period Weeks   Status New     OT LONG TERM GOAL #3   Title Pt will demonstrate RUE grip strength of at least 30 lbs for increased Functional use during ADLs/ IADLs.   Time 8   Period Weeks   Status New     OT LONG TERM GOAL #4   Title Pt will resume performance of light home management/ cooking at a modified independent level   Time 8   Period Weeks   Status New               Plan - 10/17/16 1213    Clinical Impression Statement Pt arrived  today with finger wrapped in stockinette and coban. Pt reports he has finished taking his anitbiotics. He sees MD next week   Rehab Potential Good   Current Impairments/barriers affecting progress: pain   OT Frequency 2x / week   OT Duration 8 weeks   OT Treatment/Interventions Self-care/ADL training;Moist Heat;Fluidtherapy;DME and/or AE instruction;Splinting;Patient/family education;Therapeutic exercises;Ultrasound;Therapeutic exercise;Therapeutic activities;Cognitive remediation/compensation;Passive range of motion;Functional Mobility Training;Neuromuscular education;Iontophoresis;Cryotherapy;Electrical Stimulation;Energy conservation;Manual Therapy   Plan continue ROM and light functional use, seen note to MD next week   OT Home Exercise Plan A/ROM HEP   Consulted and Agree with Plan of Care Patient;Family member/caregiver      Patient will benefit from skilled therapeutic intervention in order to improve the following deficits and impairments:  Decreased range of motion, Decreased coordination, Pain, Impaired UE functional use,  Decreased knowledge of use of DME, Decreased balance, Decreased mobility, Decreased  strength  Visit Diagnosis: Stiffness of joint, hand, right  Other lack of coordination  Muscle weakness (generalized)    Problem List Patient Active Problem List   Diagnosis Date Noted  . Dyslipidemia 11/21/2015  . Diabetic retinopathy ([place])- left eye 10/02/2015  . Complications, amputation stump late ([place]) 05/20/2014  . Weakness generalized 04/07/2014  . Sepsis ([place]) 04/07/2014  . Decubitus ulcer, stage II 04/07/2014  . Decubitus ulcer of ankle   . Fatigue   . Wound infection 04/06/2014  . Decubitus ulcer of sacral region, stage 2 03/07/2014  . Glaucoma 03/07/2014  . Gout 03/07/2014  . Below knee amputation status ([place]) 02/18/2014  . Osteomyelitis ([place]) 01/04/2014  . Phantom limb ([place]) 12/14/2013  . Type 2 diabetes mellitus with diabetic foot infection ([place])   . Diabetes mellitus with peripheral vascular disease ([place])   . Asthma 05/17/2013  . S/P bilateral below knee amputation ([place]) 05/17/2013  . History of MRSA infection 04/22/2013  . Peripheral vascular disease ([place]) 11/19/2012  . End-stage renal disease on hemodialysis ([place]) 05/05/2012  . Kahler disease ([place]) 03/30/2012  . Anemia in chronic kidney disease 03/19/2012  . Hypothyroidism following radioiodine therapy 03/19/2012  . Anemia, iron deficiency 03/19/2012  . Hypertension 03/18/2012  . Chronic kidney disease (CKD), stage V ([place]) 03/18/2012  . Cardiac conduction disorder 03/18/2012  . MGUS (monoclonal gammopathy of unknown significance) 02/28/2011  . Monoclonal paraproteinemia 02/28/2011    Norlene Lanes 10/17/2016, 12:15 PM Theone Murdoch, OTR/L [phone] Phone: [phone] 12:17 PM 10/17/16 [facility] [address] [place], [place], [address] Phone: [phone]   Fax:  [phone]  Name: Jack [name] MRN: [id number] Date of Birth: January 16, 1945

## 2016-10-18 DIAGNOSIS — D689 Coagulation defect, unspecified: Secondary | ICD-10-CM | POA: Diagnosis not present

## 2016-10-18 DIAGNOSIS — E1029 Type 1 diabetes mellitus with other diabetic kidney complication: Secondary | ICD-10-CM | POA: Diagnosis not present

## 2016-10-18 DIAGNOSIS — D631 Anemia in chronic kidney disease: Secondary | ICD-10-CM | POA: Diagnosis not present

## 2016-10-18 DIAGNOSIS — T82598A Other mechanical complication of other cardiac and vascular devices and implants, initial encounter: Secondary | ICD-10-CM | POA: Diagnosis not present

## 2016-10-18 DIAGNOSIS — N186 End stage renal disease: Secondary | ICD-10-CM | POA: Diagnosis not present

## 2016-10-18 DIAGNOSIS — N2581 Secondary hyperparathyroidism of renal origin: Secondary | ICD-10-CM | POA: Diagnosis not present

## 2016-10-18 NOTE — Therapy (Signed)
[facility] [place] [address] [place], [place], [address] Phone: [phone]   Fax:  [phone]  Physical Therapy Treatment  Patient Details  Name: Jack [name] MRN: [id number] Date of Birth: November 06, 1945 Referring Provider: Meridee Score MD   Encounter Date: 10/17/2016      PT End of Session - 10/17/16 0854    Visit Number 16   Number of Visits 24   Date for PT Re-Evaluation 11/23/15   Authorization Type Medicare & G-codes every 10th visit    PT Start Time 0849   PT Stop Time 0930   PT Time Calculation (min) 41 min   Equipment Utilized During Treatment Gait belt   Activity Tolerance Patient tolerated treatment well   Behavior During Therapy [facility] for tasks assessed/performed      Past Medical History:  Diagnosis Date  . Anemia   . Asthma   . Contracture of joint    left knee  . Decubitus ulcer of sacral region, stage 2 03/07/2014  . Diabetes mellitus with peripheral vascular disease ([place])   . End-stage renal disease on hemodialysis [facility])    Started HD March 2014.  Cause of ESRD was DM.  Gets HD at [facility] on [address] on MWF schedule.   Marland Kitchen ESRD on hemodialysis ([place])   . Gangrene ([place])    right BKA  . Gangrene of foot ([place])   . Glaucoma   . Glaucoma 03/07/2014  . Heart murmur   . History of MRSA infection 04/22/2013   Bacteremia assoc w L foot wound infection Mar 2015   . Hyperparathyroidism, secondary renal ([place])   . Hypertension   . Hypothyroidism   . MRSA bacteremia   . Multiple myeloma, without mention of having achieved remission 03/30/2012   Cytogenetic neg on 03/23/2012.  Marland Kitchen Peripheral arterial disease ([place])   . Peripheral vascular disease, unspecified ([place]) 11/19/2012   In the past had R foot toe amps then R TMA. In 2015 had left foot toe amp > then TMA >then L BKA on 05/14/13   . Pneumonia    2012  . Thyroid disease    hyperparathyroidism    Past Surgical History:  Procedure Laterality Date   . ABDOMINAL AORTAGRAM Bilateral 11/06/2012   Procedure: ABDOMINAL AORTAGRAM;  Surgeon: Elam Dutch, MD;  [facility];  Service: Cardiovascular;  Laterality: Bilateral;  . AMPUTATION Right 11/10/2012   Procedure: AMPUTATION FIRST and SECOND TOES Right Foot;  Surgeon: Elam Dutch, MD;  Location: [place];  Service: Vascular;  Laterality: Right;  . AMPUTATION Left 04/07/2013   Procedure: AMPUTATION DIGIT- LEFT 1ST TOE;  Surgeon: Mal Misty, MD;  Location: [place];  Service: Vascular;  Laterality: Left;  . AMPUTATION Left 04/26/2013   Procedure: Left Foot Transmetatarsal Amputation;  Surgeon: Newt Minion, MD;  Location: [place];  Service: Orthopedics;  Laterality: Left;  . AMPUTATION Left 05/14/2013   Procedure: AMPUTATION BELOW KNEE;  Surgeon: Newt Minion, MD;  Location: [place];  Service: Orthopedics;  Laterality: Left;  Left Below Knee Amputation  . AMPUTATION Left 07/23/2013   Procedure: AMPUTATION BELOW KNEE;  Surgeon: Newt Minion, MD;  Location: [place];  Service: Orthopedics;  Laterality: Left;  Left Below Knee Amputation Revision  . AMPUTATION Right 02/18/2014   Procedure: AMPUTATION BELOW KNEE;  Surgeon: Newt Minion, MD;  Location: [place];  Service: Orthopedics;  Laterality: Right;  . AMPUTATION Right 08/20/2016   Procedure: RIGHT LONG FINGER AMPUTATION;  Surgeon: Leanora Cover, MD;  Location: [place];  Service: Orthopedics;  Laterality: Right;  . AV FISTULA PLACEMENT Left 03/25/2012   Procedure: ARTERIOVENOUS (AV) FISTULA CREATION;  Surgeon: Mal Misty, MD;  Location: [place];  Service: Vascular;  Laterality: Left;  . BELOW KNEE LEG AMPUTATION Left 05/14/2013   DR [name]  . BELOW KNEE LEG AMPUTATION Right 02/18/2014   dr duda  . CARDIAC CATHETERIZATION     approx 30 years ago  . CERVICAL DISC SURGERY    . EYE SURGERY Bilateral    CATARACTS  . I&D EXTREMITY Left 04/22/2013   Procedure: IRRIGATION AND DEBRIDEMENT LEFT FIRST TOE AMPUTATION WOUND ;  Surgeon:  Mal Misty, MD;  Location: [place];  Service: Vascular;  Laterality: Left;  . I&D EXTREMITY Right 01/05/2014   Procedure: IRRIGATION AND DEBRIDEMENT Right Heel Ulcer;  Surgeon: Mcarthur Rossetti, MD;  Location: [place];  Service: Orthopedics;  Laterality: Right;  Surgeon Available after 5PM  . INSERTION OF DIALYSIS CATHETER Right 03/19/2012   Procedure: INSERTION OF DIALYSIS CATHETER;  Surgeon: Mal Misty, MD;  Location: [place];  Service: Vascular;  Laterality: Right;  Right Internal Jugular  . LIGATION OF COMPETING BRANCHES OF ARTERIOVENOUS FISTULA Left 05/08/2012   Procedure: LIGATION OF COMPETING BRANCHES OF ARTERIOVENOUS FISTULA;  Surgeon: Mal Misty, MD;  Location: [place];  Service: Vascular;  Laterality: Left;  Ultrasound guided  . REPAIR QUADRICEPS/HAMSTRING MUSCLES Left 05/20/2014   Procedure: Left Hamstring Release;  Surgeon: Newt Minion, MD;  Location: [place];  Service: Orthopedics;  Laterality: Left;  . REVISON OF ARTERIOVENOUS FISTULA Left 01/30/2016   Procedure: CEPHALIC VEIN TURNDOWN TO LEFT UPPER ARM;  Surgeon: Elam Dutch, MD;  Location: [place];  Service: Vascular;  Laterality: Left;  . STUMP REVISION Right 05/20/2014   Procedure: Revision Right Below Knee Amputation;  Surgeon: Newt Minion, MD;  Location: [place];  Service: Orthopedics;  Laterality: Right;  . TEE WITHOUT CARDIOVERSION N/A 04/20/2013   Procedure: TRANSESOPHAGEAL ECHOCARDIOGRAM (TEE);  Surgeon: Josue Hector, MD;  Location[facility] ENDOSCOPY;  Service: Cardiovascular;  Laterality: N/A;  . THYROIDECTOMY    . TOE AMPUTATION     D/C 04-30-13  . TRANSMETATARSAL AMPUTATION Left 12/16/2012   Procedure: TRANSMETATARSAL AMPUTATION AND VAC PLACEMENT;  Surgeon: Elam Dutch, MD;  Location: [place];  Service: Vascular;  Laterality: Left;    There were no vitals filed for this visit.      Subjective Assessment - 10/17/16 0851    Subjective No falls. Completed antibiotic this past Tuesday, see's surgeon on next Tuesday.  Dr. Oneida Alar is waiting on hand to help before proceeding with fistula in right UE. Reports he has been walking with daughter at home with RW for short distances around home.    Patient is accompained by: Family member   Pertinent History bilateral transtibial amputations, anemia, asthma, contracture of L knee, decubitus ulcer of sacral region, DM with PVD, ESRD, glaucoma, heart murmur, HTN, hypothyroidism, multiple myeloma, PAD   Limitations Lifting;Standing;Walking;House hold activities   Patient Stated Goals walk without a person assisting him (walk with device only)    Currently in Pain? Yes   Pain Score 4    Pain Location Finger (Comment which one)  middle finger   Pain Orientation Right   Pain Descriptors / Indicators Aching;Throbbing   Pain Type Acute pain   Pain Onset 1 to 4 weeks ago   Pain Frequency Constant   Aggravating Factors  pressure, movement (finger flexion/extension)   Pain Relieving Factors removing pressure wrapping and soaking in warm water              [facility] Adult PT Treatment/Exercise - 10/17/16 0855      Transfers   Transfers Sit to Stand;Stand to Sit   Sit to Stand 5: Supervision;With upper extremity assist;With armrests;From chair/3-in-1   Sit to Stand Details Verbal cues for technique;Verbal cues for sequencing;Verbal cues for safe use of DME/AE   Sit to Stand Details (indicate cue type and reason) increased time needed with occasional cues on foot placement   Stand to Sit 5: Supervision;With upper extremity assist;With armrests;To chair/3-in-1   Stand to Sit Details (indicate cue type and reason) Verbal cues for sequencing;Verbal cues for technique;Verbal cues for safe use of DME/AE   Stand to Sit Details increased time needed with occasional cues to check chair placement prior to sitting down     Ambulation/Gait   Ambulation/Gait Yes   Ambulation/Gait Assistance 4: Min assist;3: Mod assist   Ambulation/Gait Assistance Details increased assistance  needed for rollator control/position with gait as distance progressed. cues needed on posture, step length and step position with gait as well.    Ambulation Distance (Feet) 60 Feet  x2, 80 x1, 60 x1   Assistive device Rollator   Gait Pattern Step-through pattern;Left flexed knee in stance;Right flexed knee in stance;Wide base of support;Poor foot clearance - left;Poor foot clearance - right;Trunk flexed;Decreased stride length   Ambulation Surface Level;Indoor     Prosthetics   Prosthetic Care Comments  pt/daughter educated on how to secure double suction seal liners in standign to prevent (or at least decrease) air bubbles he has been feeling.    Current prosthetic wear tolerance (days/week)  daily    Current prosthetic wear tolerance (#hours/day)  all awake hours every day including dialysis   Residual limb condition  pt and daughter report no skin issues   Education Provided Other (comment)  see prosthetic care comments   Person(s) Educated Patient;Child(ren)   Education Method Explanation;Demonstration;Verbal cues   Education Method Verbalized understanding;Needs further instruction   Donning Prosthesis Supervision   Doffing Prosthesis Supervision            PT Short Term Goals - 09/27/16 1138      PT SHORT TERM GOAL #1   Title Patient will verbalize understanding and return demonstration of updated HEP. (All STGs Target Date 10/24/16)   Time 1   Period Months   Status On-going   Target Date 10/24/16     PT SHORT TERM GOAL #2   Title Patient will demonstrate ability to reach 5 inches anteriorly and reach within 10 inches from the floor unilateral UE support on RW with supervision to indicate a decrease in his risk of falling.     Time 1   Period Months   Status On-going   Target Date 10/24/16     PT SHORT TERM GOAL #3   Title Patient sit to/from stand and ambulates 50' around furniture with std RW & Bil. prostheses around furniture with supervision.    Time 1    Period Months   Status New   Target Date 10/24/16     PT SHORT TERM GOAL #4   Title Patient ambulates 200 feet on indoor surfaces with std rolling walker and supervision    Time 1   Period Months   Status New   Target Date 10/24/16     PT SHORT TERM GOAL #  5   Title Patient negotiates ramp & curb with std rolling walker & prostheses with minimal guard.    Time 1   Period Months   Status New   Target Date 10/24/16           PT Long Term Goals - 09/27/16 1134      PT LONG TERM GOAL #1   Title Patient will verbalize understanding and return demonstration of ongoing HEP program. (All LTGs TARGET DATE: 11/22/2016)    Time 8   Period Weeks   Status On-going   Target Date 11/22/16     PT LONG TERM GOAL #2   Title Patient will ambulate 200 feet on firm, paved outdoor surfaces with family support to indicate a decrease risk of falling when returning to community ambulation with family support.     Time 8   Period Weeks   Status On-going   Target Date 11/22/16     PT LONG TERM GOAL #3   Title Patient ambulates 73' with standard RW and prostheses around furniture modified independent.     Time 8   Period Weeks   Status On-going   Target Date 11/22/16     PT LONG TERM GOAL #4   Title Patient will be able to ambulate over ramp/curb with rollator style walker with family assistance to indicate a decrease in his risk of falling when returning to community ambulation with family.     Time 8   Period Weeks   Status On-going   Target Date 11/22/16     PT LONG TERM GOAL #5   Title Patient will demonstrate ability to reach 8 inches anteriorly and reach within 8 inches from the floor unilateral UE support on RW with supervision to indicate a decrease in his risk of falling.     Time 8   Period Weeks   Status On-going   Target Date 11/22/16               Plan - 10/17/16 0854    Clinical Impression Statement Today's skilled session continued to address gait with rollator.  Pt able to use brakes without any increase in finger pain. Pt is progressing toward goals and should benefit from continued PT to progress toward unmet goals.   Rehab Potential Good   Clinical Impairments Affecting Rehab Potential bilateral transtibial amputations, anemia, asthma, contracture of L knee, decubitus ulcer of sacral region, DM with PVD, ESRD, glaucoma, heart murmur, HTN, hypothyroidism, multiple myeloma, PAD   PT Frequency 2x / week   PT Duration Other (comment)  9 weeks (60 days)   PT Treatment/Interventions ADLs/Self Care Home Management;Neuromuscular re-education;Balance training;Therapeutic exercise;Therapeutic activities;Functional mobility training;Stair training;Gait training;DME Instruction;Patient/family education;Prosthetic Training;Manual techniques;Scar mobilization;Passive range of motion   PT Next Visit Plan assess STGs due on 10/24/16   Consulted and Agree with Plan of Care Patient;Family member/caregiver   Family Member Consulted dtr-Cory      Patient will benefit from skilled therapeutic intervention in order to improve the following deficits and impairments:  Abnormal gait, Decreased activity tolerance, Decreased balance, Decreased coordination, Decreased range of motion, Decreased safety awareness, Decreased knowledge of use of DME, Decreased knowledge of precautions, Decreased endurance, Decreased strength, Difficulty walking, Prosthetic Dependency, Postural dysfunction  Visit Diagnosis: Muscle weakness (generalized)  Unsteadiness on feet  Other abnormalities of gait and mobility     Problem List Patient Active Problem List   Diagnosis Date Noted  . Dyslipidemia 11/21/2015  . Diabetic retinopathy ([place])- left eye 10/02/2015  .  Complications, amputation stump late ([place]) 05/20/2014  . Weakness generalized 04/07/2014  . Sepsis ([place]) 04/07/2014  . Decubitus ulcer, stage II 04/07/2014  . Decubitus ulcer of ankle   . Fatigue   . Wound infection 04/06/2014   . Decubitus ulcer of sacral region, stage 2 03/07/2014  . Glaucoma 03/07/2014  . Gout 03/07/2014  . Below knee amputation status ([place]) 02/18/2014  . Osteomyelitis ([place]) 01/04/2014  . Phantom limb ([place]) 12/14/2013  . Type 2 diabetes mellitus with diabetic foot infection ([place])   . Diabetes mellitus with peripheral vascular disease ([place])   . Asthma 05/17/2013  . S/P bilateral below knee amputation ([place]) 05/17/2013  . History of MRSA infection 04/22/2013  . Peripheral vascular disease ([place]) 11/19/2012  . End-stage renal disease on hemodialysis ([place]) 05/05/2012  . Kahler disease ([place]) 03/30/2012  . Anemia in chronic kidney disease 03/19/2012  . Hypothyroidism following radioiodine therapy 03/19/2012  . Anemia, iron deficiency 03/19/2012  . Hypertension 03/18/2012  . Chronic kidney disease (CKD), stage V ([place]) 03/18/2012  . Cardiac conduction disorder 03/18/2012  . MGUS (monoclonal gammopathy of unknown significance) 02/28/2011  . Monoclonal paraproteinemia 02/28/2011    Willow Ora, PTA, [facility] [address], [place], [place] [address] [phone] 10/18/16, 10:55 AM   Name: Jack [name] MRN: [id number] Date of Birth: 14-Jul-1945

## 2016-10-21 DIAGNOSIS — D689 Coagulation defect, unspecified: Secondary | ICD-10-CM | POA: Diagnosis not present

## 2016-10-21 DIAGNOSIS — D631 Anemia in chronic kidney disease: Secondary | ICD-10-CM | POA: Diagnosis not present

## 2016-10-21 DIAGNOSIS — E1029 Type 1 diabetes mellitus with other diabetic kidney complication: Secondary | ICD-10-CM | POA: Diagnosis not present

## 2016-10-21 DIAGNOSIS — N2581 Secondary hyperparathyroidism of renal origin: Secondary | ICD-10-CM | POA: Diagnosis not present

## 2016-10-21 DIAGNOSIS — N186 End stage renal disease: Secondary | ICD-10-CM | POA: Diagnosis not present

## 2016-10-21 DIAGNOSIS — T82598A Other mechanical complication of other cardiac and vascular devices and implants, initial encounter: Secondary | ICD-10-CM | POA: Diagnosis not present

## 2016-10-22 ENCOUNTER — Ambulatory Visit: Payer: Medicare Other | Admitting: Physical Therapy

## 2016-10-22 ENCOUNTER — Encounter: Payer: Self-pay | Admitting: Physical Therapy

## 2016-10-22 ENCOUNTER — Ambulatory Visit: Payer: Medicare Other | Admitting: Occupational Therapy

## 2016-10-22 DIAGNOSIS — M6281 Muscle weakness (generalized): Secondary | ICD-10-CM | POA: Diagnosis not present

## 2016-10-22 DIAGNOSIS — R2681 Unsteadiness on feet: Secondary | ICD-10-CM

## 2016-10-22 DIAGNOSIS — R278 Other lack of coordination: Secondary | ICD-10-CM | POA: Diagnosis not present

## 2016-10-22 DIAGNOSIS — R2689 Other abnormalities of gait and mobility: Secondary | ICD-10-CM

## 2016-10-22 DIAGNOSIS — M25641 Stiffness of right hand, not elsewhere classified: Secondary | ICD-10-CM | POA: Diagnosis not present

## 2016-10-22 NOTE — Therapy (Signed)
[facility] [address] [place], [place], [address] Phone: [phone]   Fax:  [phone]  Physical Therapy Treatment  Patient Details  Name: Jack [name] MRN: [id number] Date of Birth: 1946/01/06 Referring Provider: Meridee Score MD   Encounter Date: 10/22/2016      PT End of Session - 10/22/16 0937    Visit Number 17   Number of Visits 24   Date for PT Re-Evaluation 11/23/15   Authorization Type Medicare & G-codes every 10th visit    PT Start Time 0933   PT Stop Time 1015   PT Time Calculation (min) 42 min   Equipment Utilized During Treatment Gait belt   Activity Tolerance Patient tolerated treatment well   Behavior During Therapy [facility] for tasks assessed/performed      Past Medical History:  Diagnosis Date  . Anemia   . Asthma   . Contracture of joint    left knee  . Decubitus ulcer of sacral region, stage 2 03/07/2014  . Diabetes mellitus with peripheral vascular disease ([place])   . End-stage renal disease on hemodialysis [facility])    Started HD March 2014.  Cause of ESRD was DM.  Gets HD at [facility] on [address] on MWF schedule.   Marland Kitchen ESRD on hemodialysis ([place])   . Gangrene ([place])    right BKA  . Gangrene of foot ([place])   . Glaucoma   . Glaucoma 03/07/2014  . Heart murmur   . History of MRSA infection 04/22/2013   Bacteremia assoc w L foot wound infection Mar 2015   . Hyperparathyroidism, secondary renal ([place])   . Hypertension   . Hypothyroidism   . MRSA bacteremia   . Multiple myeloma, without mention of having achieved remission 03/30/2012   Cytogenetic neg on 03/23/2012.  Marland Kitchen Peripheral arterial disease ([place])   . Peripheral vascular disease, unspecified ([place]) 11/19/2012   In the past had R foot toe amps then R TMA. In 2015 had left foot toe amp > then TMA >then L BKA on 05/14/13   . Pneumonia    2012  . Thyroid disease    hyperparathyroidism    Past Surgical History:  Procedure Laterality Date   . ABDOMINAL AORTAGRAM Bilateral 11/06/2012   Procedure: ABDOMINAL AORTAGRAM;  Surgeon: Elam Dutch, MD;  Location: [facility] CATH LAB;  Service: Cardiovascular;  Laterality: Bilateral;  . AMPUTATION Right 11/10/2012   Procedure: AMPUTATION FIRST and SECOND TOES Right Foot;  Surgeon: Elam Dutch, MD;  Location: [facility];  Service: Vascular;  Laterality: Right;  . AMPUTATION Left 04/07/2013   Procedure: AMPUTATION DIGIT- LEFT 1ST TOE;  Surgeon: Mal Misty, MD;  Location: [place];  Service: Vascular;  Laterality: Left;  . AMPUTATION Left 04/26/2013   Procedure: Left Foot Transmetatarsal Amputation;  Surgeon: Newt Minion, MD;  Location: [place];  Service: Orthopedics;  Laterality: Left;  . AMPUTATION Left 05/14/2013   Procedure: AMPUTATION BELOW KNEE;  Surgeon: Newt Minion, MD;  Location: [place];  Service: Orthopedics;  Laterality: Left;  Left Below Knee Amputation  . AMPUTATION Left 07/23/2013   Procedure: AMPUTATION BELOW KNEE;  Surgeon: Newt Minion, MD;  Location: [place];  Service: Orthopedics;  Laterality: Left;  Left Below Knee Amputation Revision  . AMPUTATION Right 02/18/2014   Procedure: AMPUTATION BELOW KNEE;  Surgeon: Newt Minion, MD;  Location: [facility];  Service: Orthopedics;  Laterality: Right;  . AMPUTATION Right 08/20/2016   Procedure: RIGHT LONG FINGER AMPUTATION;  Surgeon: Leanora Cover, MD;  Location: [facility];  Service: Orthopedics;  Laterality: Right;  . AV FISTULA PLACEMENT Left 03/25/2012   Procedure: ARTERIOVENOUS (AV) FISTULA CREATION;  Surgeon: Mal Misty, MD;  Location: [facility];  Service: Vascular;  Laterality: Left;  . BELOW KNEE LEG AMPUTATION Left 05/14/2013   DR [name]  . BELOW KNEE LEG AMPUTATION Right 02/18/2014   dr duda  . CARDIAC CATHETERIZATION     approx 30 years ago  . CERVICAL DISC SURGERY    . EYE SURGERY Bilateral    CATARACTS  . I&D EXTREMITY Left 04/22/2013   Procedure: IRRIGATION AND DEBRIDEMENT LEFT FIRST TOE AMPUTATION WOUND ;  Surgeon:  Mal Misty, MD;  Location: [facility];  Service: Vascular;  Laterality: Left;  . I&D EXTREMITY Right 01/05/2014   Procedure: IRRIGATION AND DEBRIDEMENT Right Heel Ulcer;  Surgeon: Mcarthur Rossetti, MD;  Location: [facility];  Service: Orthopedics;  Laterality: Right;  Surgeon Available after 5PM  . INSERTION OF DIALYSIS CATHETER Right 03/19/2012   Procedure: INSERTION OF DIALYSIS CATHETER;  Surgeon: Mal Misty, MD;  Location: [facility];  Service: Vascular;  Laterality: Right;  Right Internal Jugular  . LIGATION OF COMPETING BRANCHES OF ARTERIOVENOUS FISTULA Left 05/08/2012   Procedure: LIGATION OF COMPETING BRANCHES OF ARTERIOVENOUS FISTULA;  Surgeon: Mal Misty, MD;  Location: [facility];  Service: Vascular;  Laterality: Left;  Ultrasound guided  . REPAIR QUADRICEPS/HAMSTRING MUSCLES Left 05/20/2014   Procedure: Left Hamstring Release;  Surgeon: Newt Minion, MD;  Location: [facility];  Service: Orthopedics;  Laterality: Left;  . REVISON OF ARTERIOVENOUS FISTULA Left 01/30/2016   Procedure: CEPHALIC VEIN TURNDOWN TO LEFT UPPER ARM;  Surgeon: Elam Dutch, MD;  Location: [facility];  Service: Vascular;  Laterality: Left;  . STUMP REVISION Right 05/20/2014   Procedure: Revision Right Below Knee Amputation;  Surgeon: Newt Minion, MD;  Location: [place];  Service: Orthopedics;  Laterality: Right;  . TEE WITHOUT CARDIOVERSION N/A 04/20/2013   Procedure: TRANSESOPHAGEAL ECHOCARDIOGRAM (TEE);  Surgeon: Josue Hector, MD;  Location[facility];  Service: Cardiovascular;  Laterality: N/A;  . THYROIDECTOMY    . TOE AMPUTATION     D/C 04-30-13  . TRANSMETATARSAL AMPUTATION Left 12/16/2012   Procedure: TRANSMETATARSAL AMPUTATION AND VAC PLACEMENT;  Surgeon: Elam Dutch, MD;  Location: [facility];  Service: Vascular;  Laterality: Left;    There were no vitals filed for this visit.      Subjective Assessment - 10/22/16 0934    Subjective No new complaints. No falls. OT reports finger wound looks to be more open than  has been past few visist. See's hand surgeon today after OT/PT session.    Patient is accompained by: Family member   Pertinent History bilateral transtibial amputations, anemia, asthma, contracture of L knee, decubitus ulcer of sacral region, DM with PVD, ESRD, glaucoma, heart murmur, HTN, hypothyroidism, multiple myeloma, PAD   Limitations Lifting;Standing;Walking;House hold activities   Patient Stated Goals walk without a person assisting him (walk with device only)    Currently in Pain? Yes   Pain Score 5    Pain Location Finger (Comment which one)  middle finger   Pain Orientation Right   Pain Descriptors / Indicators Throbbing   Pain Type Acute pain   Pain Onset 1 to 4 weeks ago   Pain Frequency Constant   Aggravating Factors  finger movement/flexion   Pain Relieving Factors soaking in warm water, removing pressure wrapping  [facility] Adult PT Treatment/Exercise - 10/22/16 0938      Transfers   Transfers Sit to Stand;Stand to Sit   Sit to Stand 4: Min assist;With upper extremity assist;From chair/3-in-1   Sit to Stand Details Verbal cues for technique;Verbal cues for sequencing;Verbal cues for safe use of DME/AE   Stand to Sit 4: Min guard;With upper extremity assist;To chair/3-in-1   Stand to Sit Details (indicate cue type and reason) Verbal cues for sequencing;Verbal cues for technique;Verbal cues for safe use of DME/AE     Ambulation/Gait   Ambulation/Gait Yes   Ambulation/Gait Assistance 4: Min guard;4: Min assist   Ambulation/Gait Assistance Details cues for posture, base of support, step position and RW position with gait.    Ambulation Distance (Feet) 115 Feet  x1, 70 x1   Assistive device Rolling walker  with right hand orthotic   Gait Pattern Step-through pattern;Left flexed knee in stance;Right flexed knee in stance;Wide base of support;Poor foot clearance - left;Poor foot clearance - right;Trunk flexed;Decreased stride length   Ambulation Surface  Level;Indoor   Ramp 4: Min assist   Ramp Details (indicate cue type and reason) with RW/prostheses, cues on posture, step length and walker position with ascending/descending.    Curb 4: Min assist;3: Mod assist  with RW/prosthesis   Curb Details (indicate cue type and reason) mod assist for balance with 2cd person to assist with lowering RW down, min assist to ascend curb     Prosthetics   Prosthetic Care Comments  reports no more air bubbles since last session and being shown how to secure them after releasing air.    Current prosthetic wear tolerance (days/week)  daily    Current prosthetic wear tolerance (#hours/day)  all awake hours every day including dialysis   Residual limb condition  pt and daughter report no skin issues   Education Provided Residual limb care;Correct ply sock adjustment   Person(s) Educated Patient;Child(ren)   Education Method Explanation;Demonstration;Verbal cues   Education Method Verbalized understanding;Returned demonstration;Verbal cues required;Needs further instruction   Donning Prosthesis Supervision   Doffing Prosthesis Supervision            PT Short Term Goals - 10/22/16 7893      PT SHORT TERM GOAL #1   Title Patient will verbalize understanding and return demonstration of updated HEP. (All STGs Target Date 10/24/16)   Time 1   Period Months   Status On-going     PT SHORT TERM GOAL #2   Title Patient will demonstrate ability to reach 5 inches anteriorly and reach within 10 inches from the floor unilateral UE support on RW with supervision to indicate a decrease in his risk of falling.     Time 1   Period Months   Status On-going     PT SHORT TERM GOAL #3   Title Patient sit to/from stand and ambulates 27' around furniture with std RW & Bil. prostheses around furniture with supervision.    Time 1   Period Months   Status On-going     PT SHORT TERM GOAL #4   Title Patient ambulates 200 feet on indoor surfaces with std rolling walker  and supervision    Baseline 10/22/16: pt able to ambulate up to 115 feet with RW with supervision before needed to sit due to fatigue (mild fatigue vs significant fatigue 4 weeks ago after this distance)   Time --   Period --   Status Not Met     PT SHORT TERM GOAL #  5   Title Patient negotiates ramp & curb with std rolling walker & prostheses with minimal guard.    Baseline 10/22/16: min to mod assist with RW/prostheses on ramp/curbs   Time --   Period --   Status Not Met           PT Long Term Goals - 09/27/16 1134      PT LONG TERM GOAL #1   Title Patient will verbalize understanding and return demonstration of ongoing HEP program. (All LTGs TARGET DATE: 11/22/2016)    Time 8   Period Weeks   Status On-going   Target Date 11/22/16     PT LONG TERM GOAL #2   Title Patient will ambulate 200 feet on firm, paved outdoor surfaces with family support to indicate a decrease risk of falling when returning to community ambulation with family support.     Time 8   Period Weeks   Status On-going   Target Date 11/22/16     PT LONG TERM GOAL #3   Title Patient ambulates 41' with standard RW and prostheses around furniture modified independent.     Time 8   Period Weeks   Status On-going   Target Date 11/22/16     PT LONG TERM GOAL #4   Title Patient will be able to ambulate over ramp/curb with rollator style walker with family assistance to indicate a decrease in his risk of falling when returning to community ambulation with family.     Time 8   Period Weeks   Status On-going   Target Date 11/22/16     PT LONG TERM GOAL #5   Title Patient will demonstrate ability to reach 8 inches anteriorly and reach within 8 inches from the floor unilateral UE support on RW with supervision to indicate a decrease in his risk of falling.     Time 8   Period Weeks   Status On-going   Target Date 11/22/16               Plan - 10/22/16 7915    Clinical Impression Statement Todays  skilled session began to address progress toward STGs with slow progress being made from last check at 4 weeks ago. Pt continues to be limited by finger wound, of which was hurting during today's session so RW with hand orthotic used for 1st part. Pt requested it to be removed for ramp and curb and last gait trial as he finds it uncomfortable. Will plan to check remaining STGs next session and continue as pt is able toward LTGs.    Rehab Potential Good   Clinical Impairments Affecting Rehab Potential bilateral transtibial amputations, anemia, asthma, contracture of L knee, decubitus ulcer of sacral region, DM with PVD, ESRD, glaucoma, heart murmur, HTN, hypothyroidism, multiple myeloma, PAD   PT Frequency 2x / week   PT Duration Other (comment)  9 weeks (60 days)   PT Treatment/Interventions ADLs/Self Care Home Management;Neuromuscular re-education;Balance training;Therapeutic exercise;Therapeutic activities;Functional mobility training;Stair training;Gait training;DME Instruction;Patient/family education;Prosthetic Training;Manual techniques;Scar mobilization;Passive range of motion   PT Next Visit Plan assess remaining STGs   Consulted and Agree with Plan of Care Patient;Family member/caregiver   Family Member Consulted dtr-Cory      Patient will benefit from skilled therapeutic intervention in order to improve the following deficits and impairments:  Abnormal gait, Decreased activity tolerance, Decreased balance, Decreased coordination, Decreased range of motion, Decreased safety awareness, Decreased knowledge of use of DME, Decreased knowledge of precautions, Decreased endurance, Decreased strength,  Difficulty walking, Prosthetic Dependency, Postural dysfunction  Visit Diagnosis: Muscle weakness (generalized)  Unsteadiness on feet  Other abnormalities of gait and mobility     Problem List Patient Active Problem List   Diagnosis Date Noted  . Dyslipidemia 11/21/2015  . Diabetic  retinopathy ([place])- left eye 10/02/2015  . Complications, amputation stump late ([place]) 05/20/2014  . Weakness generalized 04/07/2014  . Sepsis ([place]) 04/07/2014  . Decubitus ulcer, stage II 04/07/2014  . Decubitus ulcer of ankle   . Fatigue   . Wound infection 04/06/2014  . Decubitus ulcer of sacral region, stage 2 03/07/2014  . Glaucoma 03/07/2014  . Gout 03/07/2014  . Below knee amputation status ([place]) 02/18/2014  . Osteomyelitis ([place]) 01/04/2014  . Phantom limb ([place]) 12/14/2013  . Type 2 diabetes mellitus with diabetic foot infection ([place])   . Diabetes mellitus with peripheral vascular disease ([place])   . Asthma 05/17/2013  . S/P bilateral below knee amputation ([place]) 05/17/2013  . History of MRSA infection 04/22/2013  . Peripheral vascular disease ([place]) 11/19/2012  . End-stage renal disease on hemodialysis ([place]) 05/05/2012  . Kahler disease ([place]) 03/30/2012  . Anemia in chronic kidney disease 03/19/2012  . Hypothyroidism following radioiodine therapy 03/19/2012  . Anemia, iron deficiency 03/19/2012  . Hypertension 03/18/2012  . Chronic kidney disease (CKD), stage V ([place]) 03/18/2012  . Cardiac conduction disorder 03/18/2012  . MGUS (monoclonal gammopathy of unknown significance) 02/28/2011  . Monoclonal paraproteinemia 02/28/2011    Willow Ora, PTA, [facility] [address], [place],  [address] [phone] 10/22/16, 9:01 PM   Name: Traylon Schimming MRN: [id number] Date of Birth: Oct 01, 1945

## 2016-10-22 NOTE — Patient Instructions (Signed)
Dr Fredna Dow,   Jack [name] has been receiving occupational therapy for his right middle finger amputation. We have been performing A/ROM and light functional use. A/ROM RUE middle finger: MP flexion 75*, PIP flexion 80* When will he be able to perform light strengthening? Please send any new orders or recommendations.  Sincerely, Theone Murdoch, OTR/L

## 2016-10-22 NOTE — Therapy (Signed)
[facility] [address] [place], [place], [address] Phone: [phone]   Fax:  [phone]  Occupational Therapy Treatment  Patient Details  Name: Pookela Sellin MRN: [id number] Date of Birth: 1945-12-06 Referring Provider: Dr. Leanora Cover  Encounter Date: 10/22/2016      OT End of Session - 10/22/16 1254    Visit Number 4   Number of Visits 17   Date for OT Re-Evaluation 11/30/16   Authorization Type UHC MCR   Authorization - Visit Number 4   Authorization - Number of Visits 10   OT Start Time 0848   OT Stop Time 0930   OT Time Calculation (min) 42 min   Activity Tolerance Patient tolerated treatment well   Behavior During Therapy [facility] for tasks assessed/performed      Past Medical History:  Diagnosis Date  . Anemia   . Asthma   . Contracture of joint    left knee  . Decubitus ulcer of sacral region, stage 2 03/07/2014  . Diabetes mellitus with peripheral vascular disease ([place])   . End-stage renal disease on hemodialysis [facility])    Started HD March 2014.  Cause of ESRD was DM.  Gets HD at [facility] on [address] on MWF schedule.   Marland Kitchen ESRD on hemodialysis ([place])   . Gangrene ([place])    right BKA  . Gangrene of foot ([place])   . Glaucoma   . Glaucoma 03/07/2014  . Heart murmur   . History of MRSA infection 04/22/2013   Bacteremia assoc w L foot wound infection Mar 2015   . Hyperparathyroidism, secondary renal ([place])   . Hypertension   . Hypothyroidism   . MRSA bacteremia   . Multiple myeloma, without mention of having achieved remission 03/30/2012   Cytogenetic neg on 03/23/2012.  Marland Kitchen Peripheral arterial disease ([place])   . Peripheral vascular disease, unspecified ([place]) 11/19/2012   In the past had R foot toe amps then R TMA. In 2015 had left foot toe amp > then TMA >then L BKA on 05/14/13   . Pneumonia    2012  . Thyroid disease    hyperparathyroidism    Past Surgical History:  Procedure Laterality Date  .  ABDOMINAL AORTAGRAM Bilateral 11/06/2012   Procedure: ABDOMINAL AORTAGRAM;  Surgeon: Elam Dutch, MD;  Location: [facility] CATH LAB;  Service: Cardiovascular;  Laterality: Bilateral;  . AMPUTATION Right 11/10/2012   Procedure: AMPUTATION FIRST and SECOND TOES Right Foot;  Surgeon: Elam Dutch, MD;  Location: [facility];  Service: Vascular;  Laterality: Right;  . AMPUTATION Left 04/07/2013   Procedure: AMPUTATION DIGIT- LEFT 1ST TOE;  Surgeon: Mal Misty, MD;  Location: [place];  Service: Vascular;  Laterality: Left;  . AMPUTATION Left 04/26/2013   Procedure: Left Foot Transmetatarsal Amputation;  Surgeon: Newt Minion, MD;  Location: [place];  Service: Orthopedics;  Laterality: Left;  . AMPUTATION Left 05/14/2013   Procedure: AMPUTATION BELOW KNEE;  Surgeon: Newt Minion, MD;  Location: [place];  Service: Orthopedics;  Laterality: Left;  Left Below Knee Amputation  . AMPUTATION Left 07/23/2013   Procedure: AMPUTATION BELOW KNEE;  Surgeon: Newt Minion, MD;  Location: [place];  Service: Orthopedics;  Laterality: Left;  Left Below Knee Amputation Revision  . AMPUTATION Right 02/18/2014   Procedure: AMPUTATION BELOW KNEE;  Surgeon: Newt Minion, MD;  Location: [place];  Service: Orthopedics;  Laterality: Right;  . AMPUTATION Right 08/20/2016   Procedure: RIGHT LONG FINGER  AMPUTATION;  Surgeon: Leanora Cover, MD;  Location: [place];  Service: Orthopedics;  Laterality: Right;  . AV FISTULA PLACEMENT Left 03/25/2012   Procedure: ARTERIOVENOUS (AV) FISTULA CREATION;  Surgeon: Mal Misty, MD;  Location: [place];  Service: Vascular;  Laterality: Left;  . BELOW KNEE LEG AMPUTATION Left 05/14/2013   DR [name]  . BELOW KNEE LEG AMPUTATION Right 02/18/2014   dr duda  . CARDIAC CATHETERIZATION     approx 30 years ago  . CERVICAL DISC SURGERY    . EYE SURGERY Bilateral    CATARACTS  . I&D EXTREMITY Left 04/22/2013   Procedure: IRRIGATION AND DEBRIDEMENT LEFT FIRST TOE AMPUTATION WOUND ;  Surgeon: Mal Misty, MD;  Location: [place];  Service: Vascular;  Laterality: Left;  . I&D EXTREMITY Right 01/05/2014   Procedure: IRRIGATION AND DEBRIDEMENT Right Heel Ulcer;  Surgeon: Mcarthur Rossetti, MD;  Location: [place];  Service: Orthopedics;  Laterality: Right;  Surgeon Available after 5PM  . INSERTION OF DIALYSIS CATHETER Right 03/19/2012   Procedure: INSERTION OF DIALYSIS CATHETER;  Surgeon: Mal Misty, MD;  Location: [place];  Service: Vascular;  Laterality: Right;  Right Internal Jugular  . LIGATION OF COMPETING BRANCHES OF ARTERIOVENOUS FISTULA Left 05/08/2012   Procedure: LIGATION OF COMPETING BRANCHES OF ARTERIOVENOUS FISTULA;  Surgeon: Mal Misty, MD;  Location: [place];  Service: Vascular;  Laterality: Left;  Ultrasound guided  . REPAIR QUADRICEPS/HAMSTRING MUSCLES Left 05/20/2014   Procedure: Left Hamstring Release;  Surgeon: Newt Minion, MD;  Location: [place];  Service: Orthopedics;  Laterality: Left;  . REVISON OF ARTERIOVENOUS FISTULA Left 01/30/2016   Procedure: CEPHALIC VEIN TURNDOWN TO LEFT UPPER ARM;  Surgeon: Elam Dutch, MD;  Location: [place];  Service: Vascular;  Laterality: Left;  . STUMP REVISION Right 05/20/2014   Procedure: Revision Right Below Knee Amputation;  Surgeon: Newt Minion, MD;  Location: [place];  Service: Orthopedics;  Laterality: Right;  . TEE WITHOUT CARDIOVERSION N/A 04/20/2013   Procedure: TRANSESOPHAGEAL ECHOCARDIOGRAM (TEE);  Surgeon: Josue Hector, MD;  Location: [facility] ENDOSCOPY;  Service: Cardiovascular;  Laterality: N/A;  . THYROIDECTOMY    . TOE AMPUTATION     D/C 04-30-13  . TRANSMETATARSAL AMPUTATION Left 12/16/2012   Procedure: TRANSMETATARSAL AMPUTATION AND VAC PLACEMENT;  Surgeon: Elam Dutch, MD;  Location: [place];  Service: Vascular;  Laterality: Left;    There were no vitals filed for this visit.      Subjective Assessment - 10/22/16 1300    Pertinent History recent right middle finger tip amputation through the middle phalanx, bilateral  transtibial amputations, anemia, asthma, contracture of L knee, decubitus ulcer of sacral region, DM with PVD, ESRD, glaucoma, heart murmur, HTN, hypothyroidism, multiple myeloma, PAD   Currently in Pain? Yes   Pain Score 5    Pain Location Finger (Comment which one)   Pain Orientation Right   Pain Descriptors / Indicators Aching   Pain Type Acute pain   Pain Onset 1 to 4 weeks ago   Pain Frequency Constant   Aggravating Factors  movement   Pain Relieving Factors soaking in warm water           Treatment: Pt's hand was cleaned with soap and water avoiding finger tip and dried throughly. Pt has small scab at fingertip with new pink skin around edges. Small amount of bloody drainage. Fingertip was dressed with stockinette, and coban. Pt performed gentle A/ROM finger flexion, PIP flexion  and MP flexion with RUE. Picking up blocks and stacking and placing large pegs into pegboard with RUE using index and middle finger, min-mod v.c for functional use. Note sent to MD.-see pt instructions                      OT Short Term Goals - 10/01/16 1055      OT SHORT TERM GOAL #1   Title I with inital HEP   Time 4   Period Weeks   Status New   Target Date 10/31/16     OT SHORT TERM GOAL #2   Title Pt / caregiver will verbalize understanding of daily dressing changes, wound care and s/s of infection   Time 4   Period Weeks   Status New     OT SHORT TERM GOAL #3   Title Pt/ caregiver will verbalize understanding of AE/ DME in order to maximize pt safety and indepenence with ADLs/ IADLs.   Time 4   Period Weeks   Status New     OT SHORT TERM GOAL #4   Title Pt will resume performance of all basic ADLs at a modified independent level.   Time 4   Period Weeks   Status New     OT SHORT TERM GOAL #5   Title Pt will demonstrate adequate RUE strength in order to donn prosthesis/ liners independently.   Time 4   Period Weeks   Status New           OT Long Term  Goals - 10/01/16 1057      OT LONG TERM GOAL #1   Title I with updated HEP   Time 8   Period Weeks   Status New   Target Date 11/30/16     OT LONG TERM GOAL #2   Title Pt will report using RUE consistently for ADLS at least 17%P for ADLs/ IADLs with pain less than or equal to 2/10.   Time 8   Period Weeks   Status New     OT LONG TERM GOAL #3   Title Pt will demonstrate RUE grip strength of at least 30 lbs for increased Functional use during ADLs/ IADLs.   Time 8   Period Weeks   Status New     OT LONG TERM GOAL #4   Title Pt will resume performance of light home management/ cooking at a modified independent level   Time 8   Period Weeks   Status New               Plan - 10/22/16 1255    Clinical Impression Statement pt arrive with his finger wrapped in stockinette and coban. Pt sees MD today, Therapist sent note with pt.   Rehab Potential Good   Current Impairments/barriers affecting progress: pain   OT Frequency 2x / week   OT Duration 8 weeks   OT Treatment/Interventions Self-care/ADL training;Moist Heat;Fluidtherapy;DME and/or AE instruction;Splinting;Patient/family education;Therapeutic exercises;Ultrasound;Therapeutic exercise;Therapeutic activities;Cognitive remediation/compensation;Passive range of motion;Functional Mobility Training;Neuromuscular education;Iontophoresis;Cryotherapy;Electrical Stimulation;Energy conservation;Manual Therapy   Plan ROM, light functional use   OT Home Exercise Plan A/ROM HEP   Consulted and Agree with Plan of Care Patient;Family member/caregiver      Patient will benefit from skilled therapeutic intervention in order to improve the following deficits and impairments:  Decreased range of motion, Decreased coordination, Pain, Impaired UE functional use, Decreased knowledge of use of DME, Decreased balance, Decreased mobility, Decreased strength  Visit Diagnosis: Stiffness of joint,  hand, right  Other lack of  coordination  Muscle weakness (generalized)  Unsteadiness on feet    Problem List Patient Active Problem List   Diagnosis Date Noted  . Dyslipidemia 11/21/2015  . Diabetic retinopathy ([place])- left eye 10/02/2015  . Complications, amputation stump late ([place]) 05/20/2014  . Weakness generalized 04/07/2014  . Sepsis ([place]) 04/07/2014  . Decubitus ulcer, stage II 04/07/2014  . Decubitus ulcer of ankle   . Fatigue   . Wound infection 04/06/2014  . Decubitus ulcer of sacral region, stage 2 03/07/2014  . Glaucoma 03/07/2014  . Gout 03/07/2014  . Below knee amputation status ([place]) 02/18/2014  . Osteomyelitis ([place]) 01/04/2014  . Phantom limb ([place]) 12/14/2013  . Type 2 diabetes mellitus with diabetic foot infection ([place])   . Diabetes mellitus with peripheral vascular disease ([place])   . Asthma 05/17/2013  . S/P bilateral below knee amputation ([place]) 05/17/2013  . History of MRSA infection 04/22/2013  . Peripheral vascular disease ([place]) 11/19/2012  . End-stage renal disease on hemodialysis ([place]) 05/05/2012  . Kahler disease ([place]) 03/30/2012  . Anemia in chronic kidney disease 03/19/2012  . Hypothyroidism following radioiodine therapy 03/19/2012  . Anemia, iron deficiency 03/19/2012  . Hypertension 03/18/2012  . Chronic kidney disease (CKD), stage V ([place]) 03/18/2012  . Cardiac conduction disorder 03/18/2012  . MGUS (monoclonal gammopathy of unknown significance) 02/28/2011  . Monoclonal paraproteinemia 02/28/2011    Rayfield Beem 10/22/2016, 1:01 PM  [facility] [address] [place], [place], [address] Phone: [phone]   Fax:  [phone]  Name: Zamere Pasternak MRN: [id number] Date of Birth: 1945/03/07

## 2016-10-23 DIAGNOSIS — D631 Anemia in chronic kidney disease: Secondary | ICD-10-CM | POA: Diagnosis not present

## 2016-10-23 DIAGNOSIS — D689 Coagulation defect, unspecified: Secondary | ICD-10-CM | POA: Diagnosis not present

## 2016-10-23 DIAGNOSIS — E1029 Type 1 diabetes mellitus with other diabetic kidney complication: Secondary | ICD-10-CM | POA: Diagnosis not present

## 2016-10-23 DIAGNOSIS — N2581 Secondary hyperparathyroidism of renal origin: Secondary | ICD-10-CM | POA: Diagnosis not present

## 2016-10-23 DIAGNOSIS — T82598A Other mechanical complication of other cardiac and vascular devices and implants, initial encounter: Secondary | ICD-10-CM | POA: Diagnosis not present

## 2016-10-23 DIAGNOSIS — N186 End stage renal disease: Secondary | ICD-10-CM | POA: Diagnosis not present

## 2016-10-24 ENCOUNTER — Ambulatory Visit: Payer: Medicare Other | Admitting: Occupational Therapy

## 2016-10-24 ENCOUNTER — Ambulatory Visit: Payer: Medicare Other | Admitting: Physical Therapy

## 2016-10-24 ENCOUNTER — Encounter: Payer: Self-pay | Admitting: Physical Therapy

## 2016-10-24 DIAGNOSIS — R278 Other lack of coordination: Secondary | ICD-10-CM

## 2016-10-24 DIAGNOSIS — R2689 Other abnormalities of gait and mobility: Secondary | ICD-10-CM

## 2016-10-24 DIAGNOSIS — R2681 Unsteadiness on feet: Secondary | ICD-10-CM

## 2016-10-24 DIAGNOSIS — M25641 Stiffness of right hand, not elsewhere classified: Secondary | ICD-10-CM

## 2016-10-24 DIAGNOSIS — M6281 Muscle weakness (generalized): Secondary | ICD-10-CM | POA: Diagnosis not present

## 2016-10-24 NOTE — Therapy (Signed)
[facility] [address] [place], [place], [address] Phone: [phone]   Fax:  [phone]  Occupational Therapy Treatment  Patient Details  Name: Jack [name] MRN: [id number] Date of Birth: 25-Sep-1945 Referring Provider: Dr. Leanora Cover  Encounter Date: 10/24/2016      OT End of Session - 10/24/16 1230    Visit Number 5   Number of Visits 17   Date for OT Re-Evaluation 11/30/16   Authorization Type UHC MCR   Authorization - Visit Number 5   Authorization - Number of Visits 10   OT Start Time 0805   OT Stop Time 0845   OT Time Calculation (min) 40 min   Activity Tolerance Patient tolerated treatment well   Behavior During Therapy [facility] for tasks assessed/performed      Past Medical History:  Diagnosis Date  . Anemia   . Asthma   . Contracture of joint    left knee  . Decubitus ulcer of sacral region, stage 2 03/07/2014  . Diabetes mellitus with peripheral vascular disease ([place])   . End-stage renal disease on hemodialysis [facility])    Started HD March 2014.  Cause of ESRD was DM.  Gets HD at [facility] on [place] on MWF schedule.   Marland Kitchen ESRD on hemodialysis ([place])   . Gangrene ([place])    right BKA  . Gangrene of foot ([place])   . Glaucoma   . Glaucoma 03/07/2014  . Heart murmur   . History of MRSA infection 04/22/2013   Bacteremia assoc w L foot wound infection Mar 2015   . Hyperparathyroidism, secondary renal ([place])   . Hypertension   . Hypothyroidism   . MRSA bacteremia   . Multiple myeloma, without mention of having achieved remission 03/30/2012   Cytogenetic neg on 03/23/2012.  Marland Kitchen Peripheral arterial disease ([place])   . Peripheral vascular disease, unspecified ([place]) 11/19/2012   In the past had R foot toe amps then R TMA. In 2015 had left foot toe amp > then TMA >then L BKA on 05/14/13   . Pneumonia    2012  . Thyroid disease    hyperparathyroidism    Past Surgical History:  Procedure Laterality Date   . ABDOMINAL AORTAGRAM Bilateral 11/06/2012   Procedure: ABDOMINAL AORTAGRAM;  Surgeon: Elam Dutch, MD;  Location: [facility] CATH LAB;  Service: Cardiovascular;  Laterality: Bilateral;  . AMPUTATION Right 11/10/2012   Procedure: AMPUTATION FIRST and SECOND TOES Right Foot;  Surgeon: Elam Dutch, MD;  Location: [place];  Service: Vascular;  Laterality: Right;  . AMPUTATION Left 04/07/2013   Procedure: AMPUTATION DIGIT- LEFT 1ST TOE;  Surgeon: Mal Misty, MD;  Location: [place];  Service: Vascular;  Laterality: Left;  . AMPUTATION Left 04/26/2013   Procedure: Left Foot Transmetatarsal Amputation;  Surgeon: Newt Minion, MD;  Location: [place];  Service: Orthopedics;  Laterality: Left;  . AMPUTATION Left 05/14/2013   Procedure: AMPUTATION BELOW KNEE;  Surgeon: Newt Minion, MD;  Location: [place];  Service: Orthopedics;  Laterality: Left;  Left Below Knee Amputation  . AMPUTATION Left 07/23/2013   Procedure: AMPUTATION BELOW KNEE;  Surgeon: Newt Minion, MD;  Location: [place];  Service: Orthopedics;  Laterality: Left;  Left Below Knee Amputation Revision  . AMPUTATION Right 02/18/2014   Procedure: AMPUTATION BELOW KNEE;  Surgeon: Newt Minion, MD;  Location: [place];  Service: Orthopedics;  Laterality: Right;  . AMPUTATION Right 08/20/2016   Procedure: RIGHT LONG FINGER  AMPUTATION;  Surgeon: Leanora Cover, MD;  Location: [place];  Service: Orthopedics;  Laterality: Right;  . AV FISTULA PLACEMENT Left 03/25/2012   Procedure: ARTERIOVENOUS (AV) FISTULA CREATION;  Surgeon: Mal Misty, MD;  Location: [place];  Service: Vascular;  Laterality: Left;  . BELOW KNEE LEG AMPUTATION Left 05/14/2013   DR [name]  . BELOW KNEE LEG AMPUTATION Right 02/18/2014   dr duda  . CARDIAC CATHETERIZATION     approx 30 years ago  . CERVICAL DISC SURGERY    . EYE SURGERY Bilateral    CATARACTS  . I&D EXTREMITY Left 04/22/2013   Procedure: IRRIGATION AND DEBRIDEMENT LEFT FIRST TOE AMPUTATION WOUND ;  Surgeon:  Mal Misty, MD;  Location: [place];  Service: Vascular;  Laterality: Left;  . I&D EXTREMITY Right 01/05/2014   Procedure: IRRIGATION AND DEBRIDEMENT Right Heel Ulcer;  Surgeon: Mcarthur Rossetti, MD;  Location: [place];  Service: Orthopedics;  Laterality: Right;  Surgeon Available after 5PM  . INSERTION OF DIALYSIS CATHETER Right 03/19/2012   Procedure: INSERTION OF DIALYSIS CATHETER;  Surgeon: Mal Misty, MD;  Location: [place];  Service: Vascular;  Laterality: Right;  Right Internal Jugular  . LIGATION OF COMPETING BRANCHES OF ARTERIOVENOUS FISTULA Left 05/08/2012   Procedure: LIGATION OF COMPETING BRANCHES OF ARTERIOVENOUS FISTULA;  Surgeon: Mal Misty, MD;  Location: [place];  Service: Vascular;  Laterality: Left;  Ultrasound guided  . REPAIR QUADRICEPS/HAMSTRING MUSCLES Left 05/20/2014   Procedure: Left Hamstring Release;  Surgeon: Newt Minion, MD;  Location: [place];  Service: Orthopedics;  Laterality: Left;  . REVISON OF ARTERIOVENOUS FISTULA Left 01/30/2016   Procedure: CEPHALIC VEIN TURNDOWN TO LEFT UPPER ARM;  Surgeon: Elam Dutch, MD;  Location: [place];  Service: Vascular;  Laterality: Left;  . STUMP REVISION Right 05/20/2014   Procedure: Revision Right Below Knee Amputation;  Surgeon: Newt Minion, MD;  Location: [place];  Service: Orthopedics;  Laterality: Right;  . TEE WITHOUT CARDIOVERSION N/A 04/20/2013   Procedure: TRANSESOPHAGEAL ECHOCARDIOGRAM (TEE);  Surgeon: Josue Hector, MD;  Location: [facility] ENDOSCOPY;  Service: Cardiovascular;  Laterality: N/A;  . THYROIDECTOMY    . TOE AMPUTATION     D/C 04-30-13  . TRANSMETATARSAL AMPUTATION Left 12/16/2012   Procedure: TRANSMETATARSAL AMPUTATION AND VAC PLACEMENT;  Surgeon: Elam Dutch, MD;  Location: [place];  Service: Vascular;  Laterality: Left;    There were no vitals filed for this visit.      Subjective Assessment - 10/24/16 0820    Subjective  Pt saw MD yesterday, he did not put him on more antibiotics   Pertinent  History recent right middle finger tip amputation through the middle phalanx, bilateral transtibial amputations, anemia, asthma, contracture of L knee, decubitus ulcer of sacral region, DM with PVD, ESRD, glaucoma, heart murmur, HTN, hypothyroidism, multiple myeloma, PAD   Patient Stated Goals improve ROM and function in right hand   Currently in Pain? Yes   Pain Score 2    Pain Location Finger (Comment which one)   Pain Orientation Right   Pain Descriptors / Indicators Aching   Pain Type Acute pain   Pain Onset 1 to 4 weeks ago   Pain Frequency Constant   Aggravating Factors  movement   Pain Relieving Factors soaking in warm water             Pt's hand was cleaned with soap and water avoiding finger tip and dried throughly. Pt has small  scab at fingertip with new pink skin around edges. Small amount of bloody drainage. Fingertip was dressed with stockinette, and stockinette was folded over and taped in place. Pt was instructed to use a large latex glove over his right hand when riding SCAT for infection prevention, however he was instructed to remove glove once he got home.Marland Kitchen  Pt performed gentle A/ROM finger flexion, PIP flexion and MP flexion with RUE. Picking up checkers with RUE using index and middle finger to place in connect four frame then grasping wooden dowel pegs to place/ remove from pegboard. Pt placed graded clothespins on antennae with index, ring and small fingers avoiding use of middle finger.                  OT Short Term Goals - 10/24/16 0841      OT SHORT TERM GOAL #1   Title I with inital HEP   Status Achieved     OT SHORT TERM GOAL #2   Title Pt / caregiver will verbalize understanding of daily dressing changes, wound care and s/s of infection   Status Achieved     OT SHORT TERM GOAL #3   Title Pt/ caregiver will verbalize understanding of AE/ DME in order to maximize pt safety and indepenence with ADLs/ IADLs.   Status On-going     OT  SHORT TERM GOAL #4   Title Pt will resume performance of all basic ADLs at a modified independent level.   Status On-going  90%     OT SHORT TERM GOAL #5   Title Pt will demonstrate adequate RUE strength in order to donn prosthesis/ liners independently.   Status On-going           OT Long Term Goals - 10/01/16 1057      OT LONG TERM GOAL #1   Title I with updated HEP   Time 8   Period Weeks   Status New   Target Date 11/30/16     OT LONG TERM GOAL #2   Title Pt will report using RUE consistently for ADLS at least 07%W for ADLs/ IADLs with pain less than or equal to 2/10.   Time 8   Period Weeks   Status New     OT LONG TERM GOAL #3   Title Pt will demonstrate RUE grip strength of at least 30 lbs for increased Functional use during ADLs/ IADLs.   Time 8   Period Weeks   Status New     OT LONG TERM GOAL #4   Title Pt will resume performance of light home management/ cooking at a modified independent level   Time 8   Period Weeks   Status New               Plan - 10/24/16 1231    Clinical Impression Statement Pt arrived today wearing only finger stockinette. Pt was instructed by MD to stop using coban.   Rehab Potential Good   Current Impairments/barriers affecting progress: pain   OT Frequency 2x / week   OT Duration 8 weeks   OT Treatment/Interventions Self-care/ADL training;Moist Heat;Fluidtherapy;DME and/or AE instruction;Splinting;Patient/family education;Therapeutic exercises;Ultrasound;Therapeutic exercise;Therapeutic activities;Cognitive remediation/compensation;Passive range of motion;Functional Mobility Training;Neuromuscular education;Iontophoresis;Cryotherapy;Electrical Stimulation;Energy conservation;Manual Therapy   Plan ROM, light functional use   OT Home Exercise Plan A/ROM HEP      Patient will benefit from skilled therapeutic intervention in order to improve the following deficits and impairments:  Decreased range of motion, Decreased  coordination, Pain, Impaired  UE functional use, Decreased knowledge of use of DME, Decreased balance, Decreased mobility, Decreased strength  Visit Diagnosis: Stiffness of joint, hand, right  Other lack of coordination  Muscle weakness (generalized)  Unsteadiness on feet    Problem List Patient Active Problem List   Diagnosis Date Noted  . Dyslipidemia 11/21/2015  . Diabetic retinopathy ([place])- left eye 10/02/2015  . Complications, amputation stump late ([place]) 05/20/2014  . Weakness generalized 04/07/2014  . Sepsis ([place]) 04/07/2014  . Decubitus ulcer, stage II 04/07/2014  . Decubitus ulcer of ankle   . Fatigue   . Wound infection 04/06/2014  . Decubitus ulcer of sacral region, stage 2 03/07/2014  . Glaucoma 03/07/2014  . Gout 03/07/2014  . Below knee amputation status ([place]) 02/18/2014  . Osteomyelitis ([place]) 01/04/2014  . Phantom limb ([place]) 12/14/2013  . Type 2 diabetes mellitus with diabetic foot infection ([place])   . Diabetes mellitus with peripheral vascular disease ([place])   . Asthma 05/17/2013  . S/P bilateral below knee amputation ([place]) 05/17/2013  . History of MRSA infection 04/22/2013  . Peripheral vascular disease ([place]) 11/19/2012  . End-stage renal disease on hemodialysis ([place]) 05/05/2012  . Kahler disease ([place]) 03/30/2012  . Anemia in chronic kidney disease 03/19/2012  . Hypothyroidism following radioiodine therapy 03/19/2012  . Anemia, iron deficiency 03/19/2012  . Hypertension 03/18/2012  . Chronic kidney disease (CKD), stage V ([place]) 03/18/2012  . Cardiac conduction disorder 03/18/2012  . MGUS (monoclonal gammopathy of unknown significance) 02/28/2011  . Monoclonal paraproteinemia 02/28/2011    Lizet Kelso 10/24/2016, 12:32 PM Theone Murdoch, OTR/L [phone] Phone: [phone] 12:36 PM 10/24/16 [facility] [address] [place], [place], [address] Phone: [phone]   Fax:   [phone]  Name: Jack [name] MRN: [id number] Date of Birth: [birth year]

## 2016-10-24 NOTE — Therapy (Signed)
[facility] [address] [place], [place], [address] Phone: [phone]   Fax:  [phone]  Physical Therapy Treatment  Patient Details  Name: Jack [name] MRN: [id number] Date of Birth: 1945-10-05 Referring Provider: Meridee Score MD   Encounter Date: 10/24/2016      PT End of Session - 10/24/16 0952    Visit Number 18   Number of Visits 24   Date for PT Re-Evaluation 11/23/15   Authorization Type Medicare & G-codes every 10th visit    PT Start Time 0845   PT Stop Time 0930   PT Time Calculation (min) 45 min   Equipment Utilized During Treatment Gait belt   Activity Tolerance Patient tolerated treatment well   Behavior During Therapy [facility] for tasks assessed/performed      Past Medical History:  Diagnosis Date  . Anemia   . Asthma   . Contracture of joint    left knee  . Decubitus ulcer of sacral region, stage 2 03/07/2014  . Diabetes mellitus with peripheral vascular disease ([place])   . End-stage renal disease on hemodialysis [facility])    Started HD March 2014.  Cause of ESRD was DM.  Gets HD at [facility] on [address] on MWF schedule.   Marland Kitchen ESRD on hemodialysis ([place])   . Gangrene ([place])    right BKA  . Gangrene of foot ([place])   . Glaucoma   . Glaucoma 03/07/2014  . Heart murmur   . History of MRSA infection 04/22/2013   Bacteremia assoc w L foot wound infection Mar 2015   . Hyperparathyroidism, secondary renal ([place])   . Hypertension   . Hypothyroidism   . MRSA bacteremia   . Multiple myeloma, without mention of having achieved remission 03/30/2012   Cytogenetic neg on 03/23/2012.  Marland Kitchen Peripheral arterial disease ([place])   . Peripheral vascular disease, unspecified ([place]) 11/19/2012   In the past had R foot toe amps then R TMA. In 2015 had left foot toe amp > then TMA >then L BKA on 05/14/13   . Pneumonia    2012  . Thyroid disease    hyperparathyroidism    Past Surgical History:  Procedure Laterality Date   . ABDOMINAL AORTAGRAM Bilateral 11/06/2012   Procedure: ABDOMINAL AORTAGRAM;  Surgeon: Elam Dutch, MD;  Location: [facility] C[facility];  Service: Cardiovascular;  Laterality: Bilateral;  . AMPUTATION Right 11/10/2012   Procedure: AMPUTATION FIRST and SECOND TOES Right Foot;  Surgeon: Elam Dutch, MD;  Location: [place];  Service: Vascular;  Laterality: Right;  . AMPUTATION Left 04/07/2013   Procedure: AMPUTATION DIGIT- LEFT 1ST TOE;  Surgeon: Mal Misty, MD;  Location: [place];  Service: Vascular;  Laterality: Left;  . AMPUTATION Left 04/26/2013   Procedure: Left Foot Transmetatarsal Amputation;  Surgeon: Newt Minion, MD;  Location: [place];  Service: Orthopedics;  Laterality: Left;  . AMPUTATION Left 05/14/2013   Procedure: AMPUTATION BELOW KNEE;  Surgeon: Newt Minion, MD;  Location: [place];  Service: Orthopedics;  Laterality: Left;  Left Below Knee Amputation  . AMPUTATION Left 07/23/2013   Procedure: AMPUTATION BELOW KNEE;  Surgeon: Newt Minion, MD;  Location: [place];  Service: Orthopedics;  Laterality: Left;  Left Below Knee Amputation Revision  . AMPUTATION Right 02/18/2014   Procedure: AMPUTATION BELOW KNEE;  Surgeon: Newt Minion, MD;  Location: [place];  Service: Orthopedics;  Laterality: Right;  . AMPUTATION Right 08/20/2016   Procedure: RIGHT LONG FINGER AMPUTATION;  Surgeon: Leanora Cover, MD;  Location: [place];  Service: Orthopedics;  Laterality: Right;  . AV FISTULA PLACEMENT Left 03/25/2012   Procedure: ARTERIOVENOUS (AV) FISTULA CREATION;  Surgeon: Mal Misty, MD;  Location: [place];  Service: Vascular;  Laterality: Left;  . BELOW KNEE LEG AMPUTATION Left 05/14/2013   DR [name]  . BELOW KNEE LEG AMPUTATION Right 02/18/2014   dr duda  . CARDIAC CATHETERIZATION     approx 30 years ago  . CERVICAL DISC SURGERY    . EYE SURGERY Bilateral    CATARACTS  . I&D EXTREMITY Left 04/22/2013   Procedure: IRRIGATION AND DEBRIDEMENT LEFT FIRST TOE AMPUTATION WOUND ;  Surgeon:  Mal Misty, MD;  Location: [place];  Service: Vascular;  Laterality: Left;  . I&D EXTREMITY Right 01/05/2014   Procedure: IRRIGATION AND DEBRIDEMENT Right Heel Ulcer;  Surgeon: Mcarthur Rossetti, MD;  Location: [place];  Service: Orthopedics;  Laterality: Right;  Surgeon Available after 5PM  . INSERTION OF DIALYSIS CATHETER Right 03/19/2012   Procedure: INSERTION OF DIALYSIS CATHETER;  Surgeon: Mal Misty, MD;  Location: [place];  Service: Vascular;  Laterality: Right;  Right Internal Jugular  . LIGATION OF COMPETING BRANCHES OF ARTERIOVENOUS FISTULA Left 05/08/2012   Procedure: LIGATION OF COMPETING BRANCHES OF ARTERIOVENOUS FISTULA;  Surgeon: Mal Misty, MD;  Location: [place];  Service: Vascular;  Laterality: Left;  Ultrasound guided  . REPAIR QUADRICEPS/HAMSTRING MUSCLES Left 05/20/2014   Procedure: Left Hamstring Release;  Surgeon: Newt Minion, MD;  Location: [place];  Service: Orthopedics;  Laterality: Left;  . REVISON OF ARTERIOVENOUS FISTULA Left 01/30/2016   Procedure: CEPHALIC VEIN TURNDOWN TO LEFT UPPER ARM;  Surgeon: Elam Dutch, MD;  Location: [place];  Service: Vascular;  Laterality: Left;  . STUMP REVISION Right 05/20/2014   Procedure: Revision Right Below Knee Amputation;  Surgeon: Newt Minion, MD;  Location: [place];  Service: Orthopedics;  Laterality: Right;  . TEE WITHOUT CARDIOVERSION N/A 04/20/2013   Procedure: TRANSESOPHAGEAL ECHOCARDIOGRAM (TEE);  Surgeon: Josue Hector, MD;  Location: [facility] ENDOSCOPY;  Service: Cardiovascular;  Laterality: N/A;  . THYROIDECTOMY    . TOE AMPUTATION     D/C 04-30-13  . TRANSMETATARSAL AMPUTATION Left 12/16/2012   Procedure: TRANSMETATARSAL AMPUTATION AND VAC PLACEMENT;  Surgeon: Elam Dutch, MD;  Location: [place];  Service: Vascular;  Laterality: Left;    There were no vitals filed for this visit.      Subjective Assessment - 10/24/16 0845    Subjective He went to Dr. Fredna Dow and finger is progressing slowly.    Patient is  accompained by: Family member   Pertinent History bilateral transtibial amputations, anemia, asthma, contracture of L knee, decubitus ulcer of sacral region, DM with PVD, ESRD, glaucoma, heart murmur, HTN, hypothyroidism, multiple myeloma, PAD   Limitations Lifting;Standing;Walking;House hold activities   Patient Stated Goals walk without a person assisting him (walk with device only)    Currently in Pain? Yes   Pain Score 2    Pain Location Finger (Comment which one)   Pain Orientation Right   Pain Descriptors / Indicators Aching   Pain Type Acute pain   Pain Onset 1 to 4 weeks ago   Pain Frequency Constant   Aggravating Factors  movement   Pain Relieving Factors soaking in warm water   Effect of Pain on Daily Activities difficulty using right hand  [facility] Adult PT Treatment/Exercise - 10/24/16 0845      Transfers   Transfers Sit to Stand;Stand to Sit   Sit to Stand 5: Supervision;With armrests;With upper extremity assist;From chair/3-in-1  to rollator walker   Sit to Stand Details Verbal cues for technique;Verbal cues for sequencing;Verbal cues for safe use of DME/AE   Stand to Sit 5: Supervision;With upper extremity assist;With armrests;To chair/3-in-1  from rollator walker   Stand to Sit Details (indicate cue type and reason) Verbal cues for sequencing;Verbal cues for technique;Verbal cues for safe use of DME/AE   Stand Pivot Transfers 4: Min assist  turning 180* to sit on rollator seat   Stand Pivot Transfer Details (indicate cue type and reason) verbal & tactile cues on technique including UE & LE movements     Ambulation/Gait   Ambulation/Gait Yes   Ambulation/Gait Assistance 5: Supervision;4: Min guard   Ambulation/Gait Assistance Details cues on upright posture & keeping walker safe distance.  Pt stubbing left prosthetic toe in gait. PT set-up for prosthetist to attend one of PT sessions next week for alignment check /adjustment    Ambulation Distance (Feet) 155 Feet  155', 50' around furniture, 30'   Assistive device Rollator;Prostheses  with right hand orthotic   Gait Pattern Step-through pattern;Left flexed knee in stance;Right flexed knee in stance;Wide base of support;Poor foot clearance - left;Poor foot clearance - right;Trunk flexed;Decreased stride length   Ambulation Surface Indoor;Level   Ramp 4: Min assist  rollator & prostheses, min guard   Ramp Details (indicate cue type and reason) verbal & tactile cues on posture & wt shift   Curb 4: Min assist  with rollator & prostheses, min guard   Curb Details (indicate cue type and reason) verbal & manual cues on technique including sequence.      Balance   Balance Assessed Yes     Dynamic Standing Balance   Reaching for objects comments: with locked rollator walker support: reaches 5" anteriorly and towards floor within 6" of floor with supervision.      Prosthetics   Prosthetic Care Comments  PT demo, reinstructed in need to reset suspension liners in standing with weight on prostheses so limbs are fully seated in sockets. Pt & dtr verbalized understanding.    Current prosthetic wear tolerance (days/week)  daily    Current prosthetic wear tolerance (#hours/day)  all awake hours every day including dialysis   Residual limb condition  pt and daughter report no skin issues   Education Provided [place];Other (comment)  see prosthetic care comments   Person(s) Educated Patient;Child(ren)   Education Method Explanation;Demonstration;Verbal cues   Education Method Verbalized understanding;Needs further instruction;Verbal cues required                PT Education - 10/24/16 0920    Education provided Yes   Education Details Increasing frequency of walks in home to change locations with family supervision   Person(s) Educated Patient;Child(ren)   Methods Explanation;Verbal cues   Comprehension Verbalized understanding          PT Short Term  Goals - 10/24/16 0953      PT SHORT TERM GOAL #1   Title Patient will verbalize understanding and return demonstration of updated HEP. (All STGs Target Date 10/24/16)   Baseline MET 10/24/16   Time 1   Period Months   Status Achieved     PT SHORT TERM GOAL #2   Title Patient will demonstrate ability to reach 5 inches anteriorly and reach  within 10 inches from the floor unilateral UE support on RW with supervision to indicate a decrease in his risk of falling.     Baseline MET 10/24/16   Time 1   Period Months   Status Achieved     PT SHORT TERM GOAL #3   Title Patient sit to/from stand and ambulates 50' around furniture with std RW & Bil. prostheses around furniture with supervision.    Baseline MET 10/24/16   Time 1   Period Months   Status Achieved     PT SHORT TERM GOAL #4   Title Patient ambulates 200 feet on indoor surfaces with std rolling walker and supervision    Baseline 10/24/16 progressing but distance not met: Pt ambulated 155' with rollator walker with supervision.    Status Not Met     PT SHORT TERM GOAL #5   Title Patient negotiates ramp & curb with std rolling walker & prostheses with minimal guard.    Baseline MET 10/24/16   Status Achieved           PT Long Term Goals - 09/27/16 1134      PT LONG TERM GOAL #1   Title Patient will verbalize understanding and return demonstration of ongoing HEP program. (All LTGs TARGET DATE: 11/22/2016)    Time 8   Period Weeks   Status On-going   Target Date 11/22/16     PT LONG TERM GOAL #2   Title Patient will ambulate 200 feet on firm, paved outdoor surfaces with family support to indicate a decrease risk of falling when returning to community ambulation with family support.     Time 8   Period Weeks   Status On-going   Target Date 11/22/16     PT LONG TERM GOAL #3   Title Patient ambulates 84' with standard RW and prostheses around furniture modified independent.     Time 8   Period Weeks   Status  On-going   Target Date 11/22/16     PT LONG TERM GOAL #4   Title Patient will be able to ambulate over ramp/curb with rollator style walker with family assistance to indicate a decrease in his risk of falling when returning to community ambulation with family.     Time 8   Period Weeks   Status On-going   Target Date 11/22/16     PT LONG TERM GOAL #5   Title Patient will demonstrate ability to reach 8 inches anteriorly and reach within 8 inches from the floor unilateral UE support on RW with supervision to indicate a decrease in his risk of falling.     Time 8   Period Weeks   Status On-going   Target Date 11/22/16               Plan - 10/24/16 0955    Clinical Impression Statement Patient met 4 of 5 STGs set for 30 day period. His gait improved with less assistance and slightly longer distance today, but he did not meet STG of 200' He fatigued at 155'.    Rehab Potential Good   Clinical Impairments Affecting Rehab Potential bilateral transtibial amputations, anemia, asthma, contracture of L knee, decubitus ulcer of sacral region, DM with PVD, ESRD, glaucoma, heart murmur, HTN, hypothyroidism, multiple myeloma, PAD   PT Frequency 2x / week   PT Duration Other (comment)  9 weeks (60 days)   PT Treatment/Interventions ADLs/Self Care Home Management;Neuromuscular re-education;Balance training;Therapeutic exercise;Therapeutic activities;Functional mobility training;Stair training;Gait training;DME Instruction;Patient/family  education;Prosthetic Training;Manual techniques;Scar mobilization;Passive range of motion   PT Next Visit Plan Prosthetic gait & balance with rollator for community activities and std RW for household.    Consulted and Agree with Plan of Care Patient;Family member/caregiver   Family Member Consulted dtr-Cory      Patient will benefit from skilled therapeutic intervention in order to improve the following deficits and impairments:  Abnormal gait, Decreased  activity tolerance, Decreased balance, Decreased coordination, Decreased range of motion, Decreased safety awareness, Decreased knowledge of use of DME, Decreased knowledge of precautions, Decreased endurance, Decreased strength, Difficulty walking, Prosthetic Dependency, Postural dysfunction  Visit Diagnosis: Other lack of coordination  Muscle weakness (generalized)  Unsteadiness on feet  Other abnormalities of gait and mobility     Problem List Patient Active Problem List   Diagnosis Date Noted  . Dyslipidemia 11/21/2015  . Diabetic retinopathy ([place])- left eye 10/02/2015  . Complications, amputation stump late ([place]) 05/20/2014  . Weakness generalized 04/07/2014  . Sepsis ([place]) 04/07/2014  . Decubitus ulcer, stage II 04/07/2014  . Decubitus ulcer of ankle   . Fatigue   . Wound infection 04/06/2014  . Decubitus ulcer of sacral region, stage 2 03/07/2014  . Glaucoma 03/07/2014  . Gout 03/07/2014  . Below knee amputation status ([place]) 02/18/2014  . Osteomyelitis ([place]) 01/04/2014  . Phantom limb ([place]) 12/14/2013  . Type 2 diabetes mellitus with diabetic foot infection ([place])   . Diabetes mellitus with peripheral vascular disease ([place])   . Asthma 05/17/2013  . S/P bilateral below knee amputation ([place]) 05/17/2013  . History of MRSA infection 04/22/2013  . Peripheral vascular disease ([place]) 11/19/2012  . End-stage renal disease on hemodialysis ([place]) 05/05/2012  . Kahler disease ([place]) 03/30/2012  . Anemia in chronic kidney disease 03/19/2012  . Hypothyroidism following radioiodine therapy 03/19/2012  . Anemia, iron deficiency 03/19/2012  . Hypertension 03/18/2012  . Chronic kidney disease (CKD), stage V ([place]) 03/18/2012  . Cardiac conduction disorder 03/18/2012  . MGUS (monoclonal gammopathy of unknown significance) 02/28/2011  . Monoclonal paraproteinemia 02/28/2011    Kenza Munar PT, DPT 10/24/2016, 10:01 AM  [facility] [address] [place], [place], [address] Phone: [phone]   Fax:  [phone]  Name: Jack [name] MRN: [id number] Date of Birth: 17-Jul-1945

## 2016-10-25 DIAGNOSIS — N186 End stage renal disease: Secondary | ICD-10-CM | POA: Diagnosis not present

## 2016-10-25 DIAGNOSIS — D689 Coagulation defect, unspecified: Secondary | ICD-10-CM | POA: Diagnosis not present

## 2016-10-25 DIAGNOSIS — N2581 Secondary hyperparathyroidism of renal origin: Secondary | ICD-10-CM | POA: Diagnosis not present

## 2016-10-25 DIAGNOSIS — E1029 Type 1 diabetes mellitus with other diabetic kidney complication: Secondary | ICD-10-CM | POA: Diagnosis not present

## 2016-10-25 DIAGNOSIS — T82598A Other mechanical complication of other cardiac and vascular devices and implants, initial encounter: Secondary | ICD-10-CM | POA: Diagnosis not present

## 2016-10-25 DIAGNOSIS — D631 Anemia in chronic kidney disease: Secondary | ICD-10-CM | POA: Diagnosis not present

## 2016-10-28 DIAGNOSIS — N2581 Secondary hyperparathyroidism of renal origin: Secondary | ICD-10-CM | POA: Diagnosis not present

## 2016-10-28 DIAGNOSIS — D631 Anemia in chronic kidney disease: Secondary | ICD-10-CM | POA: Diagnosis not present

## 2016-10-28 DIAGNOSIS — D689 Coagulation defect, unspecified: Secondary | ICD-10-CM | POA: Diagnosis not present

## 2016-10-28 DIAGNOSIS — T82598A Other mechanical complication of other cardiac and vascular devices and implants, initial encounter: Secondary | ICD-10-CM | POA: Diagnosis not present

## 2016-10-28 DIAGNOSIS — E1029 Type 1 diabetes mellitus with other diabetic kidney complication: Secondary | ICD-10-CM | POA: Diagnosis not present

## 2016-10-28 DIAGNOSIS — N186 End stage renal disease: Secondary | ICD-10-CM | POA: Diagnosis not present

## 2016-10-29 ENCOUNTER — Ambulatory Visit: Payer: Medicare Other | Admitting: Physical Therapy

## 2016-10-29 ENCOUNTER — Ambulatory Visit: Payer: Medicare Other | Admitting: Occupational Therapy

## 2016-10-29 ENCOUNTER — Encounter: Payer: Self-pay | Admitting: Physical Therapy

## 2016-10-29 DIAGNOSIS — M6281 Muscle weakness (generalized): Secondary | ICD-10-CM

## 2016-10-29 DIAGNOSIS — M25641 Stiffness of right hand, not elsewhere classified: Secondary | ICD-10-CM

## 2016-10-29 DIAGNOSIS — R2689 Other abnormalities of gait and mobility: Secondary | ICD-10-CM

## 2016-10-29 DIAGNOSIS — R278 Other lack of coordination: Secondary | ICD-10-CM | POA: Diagnosis not present

## 2016-10-29 DIAGNOSIS — R2681 Unsteadiness on feet: Secondary | ICD-10-CM

## 2016-10-29 NOTE — Therapy (Signed)
[facility] [address] [place], [place], [address] Phone: [phone]   Fax:  [phone]  Occupational Therapy Treatment  Patient Details  Name: Jack [name] MRN: [id number] Date of Birth: 1945-08-14 Referring Provider: Dr. Leanora Cover  Encounter Date: 10/29/2016      OT End of Session - 10/29/16 0908    Visit Number 6   Number of Visits 17   Date for OT Re-Evaluation 11/30/16   Authorization Type [employer] MCR   Authorization - Visit Number 6   Authorization - Number of Visits 10   OT Start Time 0850   OT Stop Time 0930   OT Time Calculation (min) 40 min   Activity Tolerance Patient tolerated treatment well   Behavior During Therapy [facility] for tasks assessed/performed      Past Medical History:  Diagnosis Date  . Anemia   . Asthma   . Contracture of joint    left knee  . Decubitus ulcer of sacral region, stage 2 03/07/2014  . Diabetes mellitus with peripheral vascular disease ([place])   . End-stage renal disease on hemodialysis [facility])    Started HD March 2014.  Cause of ESRD was DM.  Gets HD at [facility] on [address] on MWF schedule.   Marland Kitchen ESRD on hemodialysis ([place])   . Gangrene ([place])    right BKA  . Gangrene of foot ([place])   . Glaucoma   . Glaucoma 03/07/2014  . Heart murmur   . History of MRSA infection 04/22/2013   Bacteremia assoc w L foot wound infection Mar 2015   . Hyperparathyroidism, secondary renal ([place])   . Hypertension   . Hypothyroidism   . MRSA bacteremia   . Multiple myeloma, without mention of having achieved remission 03/30/2012   Cytogenetic neg on 03/23/2012.  Marland Kitchen Peripheral arterial disease ([place])   . Peripheral vascular disease, unspecified ([place]) 11/19/2012   In the past had R foot toe amps then R TMA. In 2015 had left foot toe amp > then TMA >then L BKA on 05/14/13   . Pneumonia    2012  . Thyroid disease    hyperparathyroidism    Past Surgical History:  Procedure Laterality Date   . ABDOMINAL AORTAGRAM Bilateral 11/06/2012   Procedure: ABDOMINAL AORTAGRAM;  Surgeon: Elam Dutch, MD;  Location: [facility] CATH LAB;  Service: Cardiovascular;  Laterality: Bilateral;  . AMPUTATION Right 11/10/2012   Procedure: AMPUTATION FIRST and SECOND TOES Right Foot;  Surgeon: Elam Dutch, MD;  Location: [place];  Service: Vascular;  Laterality: Right;  . AMPUTATION Left 04/07/2013   Procedure: AMPUTATION DIGIT- LEFT 1ST TOE;  Surgeon: Mal Misty, MD;  Location: [place];  Service: Vascular;  Laterality: Left;  . AMPUTATION Left 04/26/2013   Procedure: Left Foot Transmetatarsal Amputation;  Surgeon: Newt Minion, MD;  Location: [place];  Service: Orthopedics;  Laterality: Left;  . AMPUTATION Left 05/14/2013   Procedure: AMPUTATION BELOW KNEE;  Surgeon: Newt Minion, MD;  Location: [place];  Service: Orthopedics;  Laterality: Left;  Left Below Knee Amputation  . AMPUTATION Left 07/23/2013   Procedure: AMPUTATION BELOW KNEE;  Surgeon: Newt Minion, MD;  Location: [place];  Service: Orthopedics;  Laterality: Left;  Left Below Knee Amputation Revision  . AMPUTATION Right 02/18/2014   Procedure: AMPUTATION BELOW KNEE;  Surgeon: Newt Minion, MD;  Location: [place];  Service: Orthopedics;  Laterality: Right;  . AMPUTATION Right 08/20/2016   Procedure: RIGHT LONG FINGER  AMPUTATION;  Surgeon: Leanora Cover, MD;  Location: [place];  Service: Orthopedics;  Laterality: Right;  . AV FISTULA PLACEMENT Left 03/25/2012   Procedure: ARTERIOVENOUS (AV) FISTULA CREATION;  Surgeon: Mal Misty, MD;  Location: [place];  Service: Vascular;  Laterality: Left;  . BELOW KNEE LEG AMPUTATION Left 05/14/2013   DR [name]  . BELOW KNEE LEG AMPUTATION Right 02/18/2014   dr duda  . CARDIAC CATHETERIZATION     approx 30 years ago  . CERVICAL DISC SURGERY    . EYE SURGERY Bilateral    CATARACTS  . I&D EXTREMITY Left 04/22/2013   Procedure: IRRIGATION AND DEBRIDEMENT LEFT FIRST TOE AMPUTATION WOUND ;  Surgeon:  Mal Misty, MD;  Location: [place];  Service: Vascular;  Laterality: Left;  . I&D EXTREMITY Right 01/05/2014   Procedure: IRRIGATION AND DEBRIDEMENT Right Heel Ulcer;  Surgeon: Mcarthur Rossetti, MD;  Location: [place];  Service: Orthopedics;  Laterality: Right;  Surgeon Available after 5PM  . INSERTION OF DIALYSIS CATHETER Right 03/19/2012   Procedure: INSERTION OF DIALYSIS CATHETER;  Surgeon: Mal Misty, MD;  Location: [place];  Service: Vascular;  Laterality: Right;  Right Internal Jugular  . LIGATION OF COMPETING BRANCHES OF ARTERIOVENOUS FISTULA Left 05/08/2012   Procedure: LIGATION OF COMPETING BRANCHES OF ARTERIOVENOUS FISTULA;  Surgeon: Mal Misty, MD;  Location: [place];  Service: Vascular;  Laterality: Left;  Ultrasound guided  . REPAIR QUADRICEPS/HAMSTRING MUSCLES Left 05/20/2014   Procedure: Left Hamstring Release;  Surgeon: Newt Minion, MD;  Location: [place];  Service: Orthopedics;  Laterality: Left;  . REVISON OF ARTERIOVENOUS FISTULA Left 01/30/2016   Procedure: CEPHALIC VEIN TURNDOWN TO LEFT UPPER ARM;  Surgeon: Elam Dutch, MD;  Location: [place];  Service: Vascular;  Laterality: Left;  . STUMP REVISION Right 05/20/2014   Procedure: Revision Right Below Knee Amputation;  Surgeon: Newt Minion, MD;  Location: [place];  Service: Orthopedics;  Laterality: Right;  . TEE WITHOUT CARDIOVERSION N/A 04/20/2013   Procedure: TRANSESOPHAGEAL ECHOCARDIOGRAM (TEE);  Surgeon: Josue Hector, MD;  Location: [facility] ENDOSCOPY;  Service: Cardiovascular;  Laterality: N/A;  . THYROIDECTOMY    . TOE AMPUTATION     D/C 04-30-13  . TRANSMETATARSAL AMPUTATION Left 12/16/2012   Procedure: TRANSMETATARSAL AMPUTATION AND VAC PLACEMENT;  Surgeon: Elam Dutch, MD;  Location: [place];  Service: Vascular;  Laterality: Left;    There were no vitals filed for this visit.      Subjective Assessment - 10/29/16 0852    Pertinent History recent right middle finger tip amputation through the middle phalanx,  bilateral transtibial amputations, anemia, asthma, contracture of L knee, decubitus ulcer of sacral region, DM with PVD, ESRD, glaucoma, heart murmur, HTN, hypothyroidism, multiple myeloma, PAD   Patient Stated Goals improve ROM and function in right hand   Currently in Pain? Yes   Pain Score 6    Pain Location Hand   Pain Orientation Right   Pain Descriptors / Indicators Aching   Pain Type Acute pain   Pain Onset 1 to 4 weeks ago   Pain Frequency Constant   Aggravating Factors  movement   Pain Relieving Factors soaking in warm water       AROM:  Isolated MP flex, isolated/blocked PIP flex, composite flex x20 each  Removing/replacing cylinder/wooden pegs in pegboard with min difficulty using 3-point pinch.  Removing/replacing clothespins on pole with 1-8lbs resistance using 2point pinch for incr hand strength (not using 3rd  digit).  Placing/removing various-sized pegs in arc pegboard with min-mod difficulty using 3-point pinch.   Folding towels with min cueing to incorporate 3rd digit.  Flipping large cards incorporating 3rd digit with min difficulty.                           OT Short Term Goals - 10/24/16 0841      OT SHORT TERM GOAL #1   Title I with inital HEP   Status Achieved     OT SHORT TERM GOAL #2   Title Pt / caregiver will verbalize understanding of daily dressing changes, wound care and s/s of infection   Status Achieved     OT SHORT TERM GOAL #3   Title Pt/ caregiver will verbalize understanding of AE/ DME in order to maximize pt safety and indepenence with ADLs/ IADLs.   Status On-going     OT SHORT TERM GOAL #4   Title Pt will resume performance of all basic ADLs at a modified independent level.   Status On-going  90%     OT SHORT TERM GOAL #5   Title Pt will demonstrate adequate RUE strength in order to donn prosthesis/ liners independently.   Status On-going           OT Long Term Goals - 10/01/16 1057      OT LONG  TERM GOAL #1   Title I with updated HEP   Time 8   Period Weeks   Status New   Target Date 11/30/16     OT LONG TERM GOAL #2   Title Pt will report using RUE consistently for ADLS at least 38%L for ADLs/ IADLs with pain less than or equal to 2/10.   Time 8   Period Weeks   Status New     OT LONG TERM GOAL #3   Title Pt will demonstrate RUE grip strength of at least 30 lbs for increased Functional use during ADLs/ IADLs.   Time 8   Period Weeks   Status New     OT LONG TERM GOAL #4   Title Pt will resume performance of light home management/ cooking at a modified independent level   Time 8   Period Weeks   Status New               Plan - 10/29/16 0911    Clinical Impression Statement Pt is progressing towards goals (reports dressing change last night so did not change dressing today, but pt reports no problems).  Pt is progressing with R hand functional use.    Rehab Potential Good   Current Impairments/barriers affecting progress: pain   OT Frequency 2x / week   OT Duration 8 weeks   OT Treatment/Interventions Self-care/ADL training;Moist Heat;Fluidtherapy;DME and/or AE instruction;Splinting;Patient/family education;Therapeutic exercises;Ultrasound;Therapeutic exercise;Therapeutic activities;Cognitive remediation/compensation;Passive range of motion;Functional Mobility Training;Neuromuscular education;Iontophoresis;Cryotherapy;Electrical Stimulation;Energy conservation;Manual Therapy   Plan ROM, light functional use   OT Home Exercise Plan A/ROM HEP      Patient will benefit from skilled therapeutic intervention in order to improve the following deficits and impairments:  Decreased range of motion, Decreased coordination, Pain, Impaired UE functional use, Decreased knowledge of use of DME, Decreased balance, Decreased mobility, Decreased strength  Visit Diagnosis: Stiffness of joint, hand, right  Other lack of coordination  Muscle weakness  (generalized)    Problem List Patient Active Problem List   Diagnosis Date Noted  . Dyslipidemia 11/21/2015  . Diabetic retinopathy ([place])- left  eye 10/02/2015  . Complications, amputation stump late ([place]) 05/20/2014  . Weakness generalized 04/07/2014  . Sepsis ([place]) 04/07/2014  . Decubitus ulcer, stage II 04/07/2014  . Decubitus ulcer of ankle   . Fatigue   . Wound infection 04/06/2014  . Decubitus ulcer of sacral region, stage 2 03/07/2014  . Glaucoma 03/07/2014  . Gout 03/07/2014  . Below knee amputation status ([place]) 02/18/2014  . Osteomyelitis ([place]) 01/04/2014  . Phantom limb ([place]) 12/14/2013  . Type 2 diabetes mellitus with diabetic foot infection ([place])   . Diabetes mellitus with peripheral vascular disease ([place])   . Asthma 05/17/2013  . S/P bilateral below knee amputation ([place]) 05/17/2013  . History of MRSA infection 04/22/2013  . Peripheral vascular disease ([place]) 11/19/2012  . End-stage renal disease on hemodialysis ([place]) 05/05/2012  . Kahler disease ([place]) 03/30/2012  . Anemia in chronic kidney disease 03/19/2012  . Hypothyroidism following radioiodine therapy 03/19/2012  . Anemia, iron deficiency 03/19/2012  . Hypertension 03/18/2012  . Chronic kidney disease (CKD), stage V ([place]) 03/18/2012  . Cardiac conduction disorder 03/18/2012  . MGUS (monoclonal gammopathy of unknown significance) 02/28/2011  . Monoclonal paraproteinemia 02/28/2011    [facility] 10/29/2016, 9:20 AM  [facility] [address] [place], [place], [address] Phone: [phone]   Fax:  [phone]  Name: Jack [name] MRN: [id number] Date of Birth: 1945-02-25   Vianne Bulls, OTR/L [facility] [address]. [place], [place]  [address] [phone] phone [phone] 10/29/16 9:28 AM

## 2016-10-29 NOTE — Therapy (Signed)
[facility] [address] [place], [place], [address] Phone: [phone]   Fax:  [phone]  Physical Therapy Treatment  Patient Details  Name: Jack [name] MRN: [id number] Date of Birth: [birth year] Referring Provider: Meridee Score MD   Encounter Date: 10/29/2016      PT End of Session - 10/29/16 1259    Visit Number 19   Number of Visits 24   Date for PT Re-Evaluation 11/23/15   Authorization Type Medicare & G-codes every 10th visit    PT Start Time 0930   PT Stop Time 1010   PT Time Calculation (min) 40 min   Equipment Utilized During Treatment Gait belt   Activity Tolerance Patient tolerated treatment well;Patient limited by fatigue   Behavior During Therapy [facility] for tasks assessed/performed      Past Medical History:  Diagnosis Date  . Anemia   . Asthma   . Contracture of joint    left knee  . Decubitus ulcer of sacral region, stage 2 03/07/2014  . Diabetes mellitus with peripheral vascular disease ([place])   . End-stage renal disease on hemodialysis [facility])    Started HD March 2014.  Cause of ESRD was DM.  Gets HD at [facility] on [place] on MWF schedule.   Marland Kitchen ESRD on hemodialysis ([place])   . Gangrene ([place])    right BKA  . Gangrene of foot ([place])   . Glaucoma   . Glaucoma 03/07/2014  . Heart murmur   . History of MRSA infection 04/22/2013   Bacteremia assoc w L foot wound infection Mar 2015   . Hyperparathyroidism, secondary renal ([place])   . Hypertension   . Hypothyroidism   . MRSA bacteremia   . Multiple myeloma, without mention of having achieved remission 03/30/2012   Cytogenetic neg on 03/23/2012.  Marland Kitchen Peripheral arterial disease ([place])   . Peripheral vascular disease, unspecified ([place]) 11/19/2012   In the past had R foot toe amps then R TMA. In 2015 had left foot toe amp > then TMA >then L BKA on 05/14/13   . Pneumonia    2012  . Thyroid disease    hyperparathyroidism    Past Surgical History:   Procedure Laterality Date  . ABDOMINAL AORTAGRAM Bilateral 11/06/2012   Procedure: ABDOMINAL AORTAGRAM;  Surgeon: Elam Dutch, MD;  Location: [facility] CATH LAB;  Service: Cardiovascular;  Laterality: Bilateral;  . AMPUTATION Right 11/10/2012   Procedure: AMPUTATION FIRST and SECOND TOES Right Foot;  Surgeon: Elam Dutch, MD;  Location: [facility];  Service: Vascular;  Laterality: Right;  . AMPUTATION Left 04/07/2013   Procedure: AMPUTATION DIGIT- LEFT 1ST TOE;  Surgeon: Mal Misty, MD;  Location: [place];  Service: Vascular;  Laterality: Left;  . AMPUTATION Left 04/26/2013   Procedure: Left Foot Transmetatarsal Amputation;  Surgeon: Newt Minion, MD;  Location: [place];  Service: Orthopedics;  Laterality: Left;  . AMPUTATION Left 05/14/2013   Procedure: AMPUTATION BELOW KNEE;  Surgeon: Newt Minion, MD;  Location: [place];  Service: Orthopedics;  Laterality: Left;  Left Below Knee Amputation  . AMPUTATION Left 07/23/2013   Procedure: AMPUTATION BELOW KNEE;  Surgeon: Newt Minion, MD;  Location: [place];  Service: Orthopedics;  Laterality: Left;  Left Below Knee Amputation Revision  . AMPUTATION Right 02/18/2014   Procedure: AMPUTATION BELOW KNEE;  Surgeon: Newt Minion, MD;  Location: [place];  Service: Orthopedics;  Laterality: Right;  . AMPUTATION Right 08/20/2016   Procedure: RIGHT  LONG FINGER AMPUTATION;  Surgeon: Leanora Cover, MD;  Location: [place] [facility];  Service: Orthopedics;  Laterality: Right;  . AV FISTULA PLACEMENT Left 03/25/2012   Procedure: ARTERIOVENOUS (AV) FISTULA CREATION;  Surgeon: Mal Misty, MD;  Location: [place];  Service: Vascular;  Laterality: Left;  . BELOW KNEE LEG AMPUTATION Left 05/14/2013   DR [name]  . BELOW KNEE LEG AMPUTATION Right 02/18/2014   dr duda  . CARDIAC CATHETERIZATION     approx 30 years ago  . CERVICAL DISC SURGERY    . EYE SURGERY Bilateral    CATARACTS  . I&D EXTREMITY Left 04/22/2013   Procedure: IRRIGATION AND DEBRIDEMENT LEFT FIRST TOE  AMPUTATION WOUND ;  Surgeon: Mal Misty, MD;  Location: [place];  Service: Vascular;  Laterality: Left;  . I&D EXTREMITY Right 01/05/2014   Procedure: IRRIGATION AND DEBRIDEMENT Right Heel Ulcer;  Surgeon: Mcarthur Rossetti, MD;  Location: [place];  Service: Orthopedics;  Laterality: Right;  Surgeon Available after 5PM  . INSERTION OF DIALYSIS CATHETER Right 03/19/2012   Procedure: INSERTION OF DIALYSIS CATHETER;  Surgeon: Mal Misty, MD;  Location: [place];  Service: Vascular;  Laterality: Right;  Right Internal Jugular  . LIGATION OF COMPETING BRANCHES OF ARTERIOVENOUS FISTULA Left 05/08/2012   Procedure: LIGATION OF COMPETING BRANCHES OF ARTERIOVENOUS FISTULA;  Surgeon: Mal Misty, MD;  Location: [place];  Service: Vascular;  Laterality: Left;  Ultrasound guided  . REPAIR QUADRICEPS/HAMSTRING MUSCLES Left 05/20/2014   Procedure: Left Hamstring Release;  Surgeon: Newt Minion, MD;  Location: [place];  Service: Orthopedics;  Laterality: Left;  . REVISON OF ARTERIOVENOUS FISTULA Left 01/30/2016   Procedure: CEPHALIC VEIN TURNDOWN TO LEFT UPPER ARM;  Surgeon: Elam Dutch, MD;  Location: [facility];  Service: Vascular;  Laterality: Left;  . STUMP REVISION Right 05/20/2014   Procedure: Revision Right Below Knee Amputation;  Surgeon: Newt Minion, MD;  Location: [place];  Service: Orthopedics;  Laterality: Right;  . TEE WITHOUT CARDIOVERSION N/A 04/20/2013   Procedure: TRANSESOPHAGEAL ECHOCARDIOGRAM (TEE);  Surgeon: Josue Hector, MD;  Location: [facility] ENDOSCOPY;  Service: Cardiovascular;  Laterality: N/A;  . THYROIDECTOMY    . TOE AMPUTATION     D/C 04-30-13  . TRANSMETATARSAL AMPUTATION Left 12/16/2012   Procedure: TRANSMETATARSAL AMPUTATION AND VAC PLACEMENT;  Surgeon: Elam Dutch, MD;  Location: [place];  Service: Vascular;  Laterality: Left;    There were no vitals filed for this visit.      Subjective Assessment - 10/29/16 0934    Subjective No falls. He went to [facility] Saturday and walked  intermittently for 15-20 min and step machine 10.5 min which fatigued him.    Pertinent History bilateral transtibial amputations, anemia, asthma, contracture of L knee, decubitus ulcer of sacral region, DM with PVD, ESRD, glaucoma, heart murmur, HTN, hypothyroidism, multiple myeloma, PAD   Limitations Lifting;Standing;Walking;House hold activities   Patient Stated Goals walk without a person assisting him (walk with device only)    Currently in Pain? Yes   Pain Score 6    Pain Location Finger (Comment which one)   Pain Orientation Right   Pain Descriptors / Indicators Aching   Pain Type Acute pain   Aggravating Factors  movement   Pain Relieving Factors soaking in warm water   Effect of Pain on Daily Activities difficulty using right hand.    Multiple Pain Sites No  [facility] Adult PT Treatment/Exercise - 10/29/16 0930      Transfers   Transfers Sit to Stand;Stand to Sit   Sit to Stand 5: Supervision;With armrests;With upper extremity assist;From chair/3-in-1  to rollator walker or RW   Sit to Stand Details Verbal cues for technique;Verbal cues for sequencing;Verbal cues for safe use of DME/AE   Stand to Sit 5: Supervision;With upper extremity assist;With armrests;To chair/3-in-1  from rollator walker or RW   Stand to Sit Details (indicate cue type and reason) Verbal cues for sequencing;Verbal cues for technique;Verbal cues for safe use of DME/AE   Stand Pivot Transfers 4: Min assist  turning 180* to sit on rollator seat     Ambulation/Gait   Ambulation/Gait Yes   Ambulation/Gait Assistance 5: Supervision;4: Min guard   Ambulation Distance (Feet) 93 Feet  93', 43' & 71' outdoors rollator, 60' RW indoors   Assistive device Rollator;Prostheses  with right hand orthotic   Gait Pattern Step-through pattern;Left flexed knee in stance;Right flexed knee in stance;Wide base of support;Poor foot clearance - left;Poor foot clearance - right;Trunk  flexed;Decreased stride length   Ambulation Surface Outdoor;Paved;Indoor;Level   Ramp 4: Min assist  rollator & prostheses, min guard, outdoors   Ramp Details (indicate cue type and reason) verbal cues on rollator control, posture & wt shift   Curb 4: Min assist  with rollator & prostheses, min guard, outdoors   Curb Details (indicate cue type and reason) verbal cues on technique including sequence, rollator movement, & wt shft.      Prosthetics   Current prosthetic wear tolerance (days/week)  daily    Current prosthetic wear tolerance (#hours/day)  all awake hours every day including dialysis   Residual limb condition  pt and daughter report no skin issues   Education Provided [place];Other (comment)  see prosthetic care comments   Person(s) Educated Patient;Child(ren)   Education Method Explanation;Verbal cues   Education Method Verbalized understanding;Verbal cues required;Needs further instruction                PT Education - 10/29/16 0955    Education provided Yes   Education Details increasing gait outside of PT including higher frequency of walks in home & 1-2 walks on non-dialysis days to tolerance.    Person(s) Educated Patient;Child(ren)   Methods Explanation   Comprehension Verbalized understanding;Verbal cues required;Need further instruction          PT Short Term Goals - 10/24/16 0932      PT SHORT TERM GOAL #1   Title Patient will verbalize understanding and return demonstration of updated HEP. (All STGs Target Date 10/24/16)   Baseline MET 10/24/16   Time 1   Period Months   Status Achieved     PT SHORT TERM GOAL #2   Title Patient will demonstrate ability to reach 5 inches anteriorly and reach within 10 inches from the floor unilateral UE support on RW with supervision to indicate a decrease in his risk of falling.     Baseline MET 10/24/16   Time 1   Period Months   Status Achieved     PT SHORT TERM GOAL #3   Title Patient sit to/from  stand and ambulates 50' around furniture with std RW & Bil. prostheses around furniture with supervision.    Baseline MET 10/24/16   Time 1   Period Months   Status Achieved     PT SHORT TERM GOAL #4   Title Patient ambulates 200 feet on indoor surfaces with  std rolling walker and supervision    Baseline 10/24/16 progressing but distance not met: Pt ambulated 155' with rollator walker with supervision.    Status Not Met     PT SHORT TERM GOAL #5   Title Patient negotiates ramp & curb with std rolling walker & prostheses with minimal guard.    Baseline MET 10/24/16   Status Achieved           PT Long Term Goals - 09/27/16 1134      PT LONG TERM GOAL #1   Title Patient will verbalize understanding and return demonstration of ongoing HEP program. (All LTGs TARGET DATE: 11/22/2016)    Time 8   Period Weeks   Status On-going   Target Date 11/22/16     PT LONG TERM GOAL #2   Title Patient will ambulate 200 feet on firm, paved outdoor surfaces with family support to indicate a decrease risk of falling when returning to community ambulation with family support.     Time 8   Period Weeks   Status On-going   Target Date 11/22/16     PT LONG TERM GOAL #3   Title Patient ambulates 25' with standard RW and prostheses around furniture modified independent.     Time 8   Period Weeks   Status On-going   Target Date 11/22/16     PT LONG TERM GOAL #4   Title Patient will be able to ambulate over ramp/curb with rollator style walker with family assistance to indicate a decrease in his risk of falling when returning to community ambulation with family.     Time 8   Period Weeks   Status On-going   Target Date 11/22/16     PT LONG TERM GOAL #5   Title Patient will demonstrate ability to reach 8 inches anteriorly and reach within 8 inches from the floor unilateral UE support on RW with supervision to indicate a decrease in his risk of falling.     Time 8   Period Weeks   Status  On-going   Target Date 11/22/16               Plan - 10/29/16 1300    Clinical Impression Statement Patient & dtr report better understanding how to initiate community outings with family support with outdoor work in PT today.    Rehab Potential Good   Clinical Impairments Affecting Rehab Potential bilateral transtibial amputations, anemia, asthma, contracture of L knee, decubitus ulcer of sacral region, DM with PVD, ESRD, glaucoma, heart murmur, HTN, hypothyroidism, multiple myeloma, PAD   PT Frequency 2x / week   PT Duration Other (comment)  9 weeks (60 days)   PT Treatment/Interventions ADLs/Self Care Home Management;Neuromuscular re-education;Balance training;Therapeutic exercise;Therapeutic activities;Functional mobility training;Stair training;Gait training;DME Instruction;Patient/family education;Prosthetic Training;Manual techniques;Scar mobilization;Passive range of motion   PT Next Visit Plan Do G-code with standing balance & sit to/from stand transfers.  Prosthetic gait & balance with rollator for community activities and std RW for household.    Consulted and Agree with Plan of Care Patient;Family member/caregiver   Family Member Consulted dtr-Cory      Patient will benefit from skilled therapeutic intervention in order to improve the following deficits and impairments:  Abnormal gait, Decreased activity tolerance, Decreased balance, Decreased coordination, Decreased range of motion, Decreased safety awareness, Decreased knowledge of use of DME, Decreased knowledge of precautions, Decreased endurance, Decreased strength, Difficulty walking, Prosthetic Dependency, Postural dysfunction  Visit Diagnosis: Muscle weakness (generalized)  Unsteadiness on feet  Other abnormalities of gait and mobility     Problem List Patient Active Problem List   Diagnosis Date Noted  . Dyslipidemia 11/21/2015  . Diabetic retinopathy ([place])- left eye 10/02/2015  . Complications,  amputation stump late ([place]) 05/20/2014  . Weakness generalized 04/07/2014  . Sepsis ([place]) 04/07/2014  . Decubitus ulcer, stage II 04/07/2014  . Decubitus ulcer of ankle   . Fatigue   . Wound infection 04/06/2014  . Decubitus ulcer of sacral region, stage 2 03/07/2014  . Glaucoma 03/07/2014  . Gout 03/07/2014  . Below knee amputation status ([place]) 02/18/2014  . Osteomyelitis ([place]) 01/04/2014  . Phantom limb ([place]) 12/14/2013  . Type 2 diabetes mellitus with diabetic foot infection ([place])   . Diabetes mellitus with peripheral vascular disease ([place])   . Asthma 05/17/2013  . S/P bilateral below knee amputation ([place]) 05/17/2013  . History of MRSA infection 04/22/2013  . Peripheral vascular disease ([place]) 11/19/2012  . End-stage renal disease on hemodialysis ([place]) 05/05/2012  . Kahler disease ([place]) 03/30/2012  . Anemia in chronic kidney disease 03/19/2012  . Hypothyroidism following radioiodine therapy 03/19/2012  . Anemia, iron deficiency 03/19/2012  . Hypertension 03/18/2012  . Chronic kidney disease (CKD), stage V ([place]) 03/18/2012  . Cardiac conduction disorder 03/18/2012  . MGUS (monoclonal gammopathy of unknown significance) 02/28/2011  . Monoclonal paraproteinemia 02/28/2011    Jamey Reas PT, DPT 10/29/2016, 1:03 PM  [facility] [address] [place], [place], [address] Phone: [phone]   Fax:  [phone]  Name: Jack [name] MRN: [id number] Date of Birth: 1946-01-06

## 2016-10-30 DIAGNOSIS — T82598A Other mechanical complication of other cardiac and vascular devices and implants, initial encounter: Secondary | ICD-10-CM | POA: Diagnosis not present

## 2016-10-30 DIAGNOSIS — D689 Coagulation defect, unspecified: Secondary | ICD-10-CM | POA: Diagnosis not present

## 2016-10-30 DIAGNOSIS — N2581 Secondary hyperparathyroidism of renal origin: Secondary | ICD-10-CM | POA: Diagnosis not present

## 2016-10-30 DIAGNOSIS — E1029 Type 1 diabetes mellitus with other diabetic kidney complication: Secondary | ICD-10-CM | POA: Diagnosis not present

## 2016-10-30 DIAGNOSIS — D631 Anemia in chronic kidney disease: Secondary | ICD-10-CM | POA: Diagnosis not present

## 2016-10-30 DIAGNOSIS — N186 End stage renal disease: Secondary | ICD-10-CM | POA: Diagnosis not present

## 2016-10-31 ENCOUNTER — Encounter: Payer: Medicare Other | Admitting: Occupational Therapy

## 2016-10-31 ENCOUNTER — Encounter: Payer: Self-pay | Admitting: Physical Therapy

## 2016-10-31 ENCOUNTER — Ambulatory Visit: Payer: Medicare Other | Admitting: Physical Therapy

## 2016-10-31 DIAGNOSIS — M6281 Muscle weakness (generalized): Secondary | ICD-10-CM

## 2016-10-31 DIAGNOSIS — R2689 Other abnormalities of gait and mobility: Secondary | ICD-10-CM

## 2016-10-31 DIAGNOSIS — R2681 Unsteadiness on feet: Secondary | ICD-10-CM | POA: Diagnosis not present

## 2016-10-31 DIAGNOSIS — M25641 Stiffness of right hand, not elsewhere classified: Secondary | ICD-10-CM | POA: Diagnosis not present

## 2016-10-31 DIAGNOSIS — R278 Other lack of coordination: Secondary | ICD-10-CM | POA: Diagnosis not present

## 2016-11-01 DIAGNOSIS — D689 Coagulation defect, unspecified: Secondary | ICD-10-CM | POA: Diagnosis not present

## 2016-11-01 DIAGNOSIS — N2581 Secondary hyperparathyroidism of renal origin: Secondary | ICD-10-CM | POA: Diagnosis not present

## 2016-11-01 DIAGNOSIS — E1029 Type 1 diabetes mellitus with other diabetic kidney complication: Secondary | ICD-10-CM | POA: Diagnosis not present

## 2016-11-01 DIAGNOSIS — D631 Anemia in chronic kidney disease: Secondary | ICD-10-CM | POA: Diagnosis not present

## 2016-11-01 DIAGNOSIS — T82598A Other mechanical complication of other cardiac and vascular devices and implants, initial encounter: Secondary | ICD-10-CM | POA: Diagnosis not present

## 2016-11-01 DIAGNOSIS — N186 End stage renal disease: Secondary | ICD-10-CM | POA: Diagnosis not present

## 2016-11-01 NOTE — Therapy (Signed)
[facility] [address] [place], [place], [address] Phone: [phone]   Fax:  [phone]  Physical Therapy Treatment  Patient Details  Name: Jack [name] MRN: [id number] Date of Birth: July 07, 1945 Referring Provider: Meridee Score MD   Encounter Date: 10/31/2016      PT End of Session - 10/31/16 1324    Visit Number 20   Number of Visits 24   Date for PT Re-Evaluation 11/23/15   Authorization Type Medicare & G-codes every 10th visit    PT Start Time 1319   PT Stop Time 1400   PT Time Calculation (min) 41 min   Equipment Utilized During Treatment Gait belt   Activity Tolerance Patient tolerated treatment well;Patient limited by fatigue   Behavior During Therapy [facility] for tasks assessed/performed      Past Medical History:  Diagnosis Date  . Anemia   . Asthma   . Contracture of joint    left knee  . Decubitus ulcer of sacral region, stage 2 03/07/2014  . Diabetes mellitus with peripheral vascular disease ([place])   . End-stage renal disease on hemodialysis [facility])    Started HD March 2014.  Cause of ESRD was DM.  Gets HD at [facility] on [address] on MWF schedule.   Marland Kitchen ESRD on hemodialysis ([place])   . Gangrene ([place])    right BKA  . Gangrene of foot ([place])   . Glaucoma   . Glaucoma 03/07/2014  . Heart murmur   . History of MRSA infection 04/22/2013   Bacteremia assoc w L foot wound infection Mar 2015   . Hyperparathyroidism, secondary renal ([place])   . Hypertension   . Hypothyroidism   . MRSA bacteremia   . Multiple myeloma, without mention of having achieved remission 03/30/2012   Cytogenetic neg on 03/23/2012.  Marland Kitchen Peripheral arterial disease ([place])   . Peripheral vascular disease, unspecified ([name]) 11/19/2012   In the past had R foot toe amps then R TMA. In 2015 had left foot toe amp > then TMA >then L BKA on 05/14/13   . Pneumonia    2012  . Thyroid disease    hyperparathyroidism    Past Surgical History:   Procedure Laterality Date  . ABDOMINAL AORTAGRAM Bilateral 11/06/2012   Procedure: ABDOMINAL AORTAGRAM;  Surgeon: Elam Dutch, MD;  Location: [facility] CATH LAB;  Service: Cardiovascular;  Laterality: Bilateral;  . AMPUTATION Right 11/10/2012   Procedure: AMPUTATION FIRST and SECOND TOES Right Foot;  Surgeon: Elam Dutch, MD;  Location: [place];  Service: Vascular;  Laterality: Right;  . AMPUTATION Left 04/07/2013   Procedure: AMPUTATION DIGIT- LEFT 1ST TOE;  Surgeon: Mal Misty, MD;  Location: [place];  Service: Vascular;  Laterality: Left;  . AMPUTATION Left 04/26/2013   Procedure: Left Foot Transmetatarsal Amputation;  Surgeon: Newt Minion, MD;  Location: [place];  Service: Orthopedics;  Laterality: Left;  . AMPUTATION Left 05/14/2013   Procedure: AMPUTATION BELOW KNEE;  Surgeon: Newt Minion, MD;  Location: [place];  Service: Orthopedics;  Laterality: Left;  Left Below Knee Amputation  . AMPUTATION Left 07/23/2013   Procedure: AMPUTATION BELOW KNEE;  Surgeon: Newt Minion, MD;  Location: [place];  Service: Orthopedics;  Laterality: Left;  Left Below Knee Amputation Revision  . AMPUTATION Right 02/18/2014   Procedure: AMPUTATION BELOW KNEE;  Surgeon: Newt Minion, MD;  Location: [place];  Service: Orthopedics;  Laterality: Right;  . AMPUTATION Right 08/20/2016   Procedure: RIGHT  LONG FINGER AMPUTATION;  Surgeon: Leanora Cover, MD;  Location: [place];  Service: Orthopedics;  Laterality: Right;  . AV FISTULA PLACEMENT Left 03/25/2012   Procedure: ARTERIOVENOUS (AV) FISTULA CREATION;  Surgeon: Mal Misty, MD;  Location: [place];  Service: Vascular;  Laterality: Left;  . BELOW KNEE LEG AMPUTATION Left 05/14/2013   DR [name]  . BELOW KNEE LEG AMPUTATION Right 02/18/2014   dr duda  . CARDIAC CATHETERIZATION     approx 30 years ago  . CERVICAL DISC SURGERY    . EYE SURGERY Bilateral    CATARACTS  . I&D EXTREMITY Left 04/22/2013   Procedure: IRRIGATION AND DEBRIDEMENT LEFT FIRST TOE  AMPUTATION WOUND ;  Surgeon: Mal Misty, MD;  Location: [place];  Service: Vascular;  Laterality: Left;  . I&D EXTREMITY Right 01/05/2014   Procedure: IRRIGATION AND DEBRIDEMENT Right Heel Ulcer;  Surgeon: Mcarthur Rossetti, MD;  Location: [place];  Service: Orthopedics;  Laterality: Right;  Surgeon Available after 5PM  . INSERTION OF DIALYSIS CATHETER Right 03/19/2012   Procedure: INSERTION OF DIALYSIS CATHETER;  Surgeon: Mal Misty, MD;  Location: [place];  Service: Vascular;  Laterality: Right;  Right Internal Jugular  . LIGATION OF COMPETING BRANCHES OF ARTERIOVENOUS FISTULA Left 05/08/2012   Procedure: LIGATION OF COMPETING BRANCHES OF ARTERIOVENOUS FISTULA;  Surgeon: Mal Misty, MD;  Location: [place];  Service: Vascular;  Laterality: Left;  Ultrasound guided  . REPAIR QUADRICEPS/HAMSTRING MUSCLES Left 05/20/2014   Procedure: Left Hamstring Release;  Surgeon: Newt Minion, MD;  Location: [place];  Service: Orthopedics;  Laterality: Left;  . REVISON OF ARTERIOVENOUS FISTULA Left 01/30/2016   Procedure: CEPHALIC VEIN TURNDOWN TO LEFT UPPER ARM;  Surgeon: Elam Dutch, MD;  Location: [place];  Service: Vascular;  Laterality: Left;  . STUMP REVISION Right 05/20/2014   Procedure: Revision Right Below Knee Amputation;  Surgeon: Newt Minion, MD;  Location: [place];  Service: Orthopedics;  Laterality: Right;  . TEE WITHOUT CARDIOVERSION N/A 04/20/2013   Procedure: TRANSESOPHAGEAL ECHOCARDIOGRAM (TEE);  Surgeon: Josue Hector, MD;  Location[facility] ENDOSCOPY;  Service: Cardiovascular;  Laterality: N/A;  . THYROIDECTOMY    . TOE AMPUTATION     D/C 04-30-13  . TRANSMETATARSAL AMPUTATION Left 12/16/2012   Procedure: TRANSMETATARSAL AMPUTATION AND VAC PLACEMENT;  Surgeon: Elam Dutch, MD;  Location: [place];  Service: Vascular;  Laterality: Left;    There were no vitals filed for this visit.      Subjective Assessment - 10/31/16 1322    Subjective No new complaints. No falls to report. States the  finger does not hurt as much today.    Patient is accompained by: Family member   Pertinent History bilateral transtibial amputations, anemia, asthma, contracture of L knee, decubitus ulcer of sacral region, DM with PVD, ESRD, glaucoma, heart murmur, HTN, hypothyroidism, multiple myeloma, PAD   Limitations Lifting;Standing;Walking;House hold activities   Patient Stated Goals walk without a person assisting him (walk with device only)    Currently in Pain? No/denies   Pain Score 0-No pain  5/10 when it does hurt   Pain Location Finger (Comment which one)   Pain Orientation Right   Pain Descriptors / Indicators Aching   Pain Type Acute pain   Pain Onset 1 to 4 weeks ago   Pain Frequency Intermittent   Aggravating Factors  moving finger in certain ways   Pain Relieving Factors soaking in warm water  [place] Adult PT Treatment/Exercise - 10/31/16 1325      Transfers   Transfers Sit to Stand;Stand to Sit   Sit to Stand 5: Supervision;With armrests;With upper extremity assist;From chair/3-in-1   Stand to Sit 5: Supervision;With upper extremity assist;With armrests;To chair/3-in-1     Ambulation/Gait   Ambulation/Gait Yes   Ambulation/Gait Assistance 5: Supervision;4: Min guard   Ambulation/Gait Assistance Details gait with RW working on distance with first lap with cues on step length, increased base of support, posture and walker position with gait. second gait lap worked on negotating around barriers both left/right and on 180 degree turns. cues needed for base of support, step position   Ambulation Distance (Feet) 125 Feet  x1, 50 x2   Assistive device Rolling walker;Prostheses   Gait Pattern Step-through pattern;Left flexed knee in stance;Right flexed knee in stance;Wide base of support;Poor foot clearance - left;Poor foot clearance - right;Trunk flexed;Decreased stride length   Ambulation Surface Level;Indoor     Self-Care   Self-Care Other Self-Care Comments    Other Self-Care Comments  wound on finger checked due to no OT today or since last Thursday. Dressing removed for wound to be checked and then reappled.      Prosthetics   Current prosthetic wear tolerance (days/week)  daily    Current prosthetic wear tolerance (#hours/day)  all awake hours every day including dialysis   Residual limb condition  pt and daughter report no skin issues   Donning Prosthesis Supervision   Doffing Prosthesis Supervision              PT Short Term Goals - 10/24/16 9147      PT SHORT TERM GOAL #1   Title Patient will verbalize understanding and return demonstration of updated HEP. (All STGs Target Date 10/24/16)   Baseline MET 10/24/16   Time 1   Period Months   Status Achieved     PT SHORT TERM GOAL #2   Title Patient will demonstrate ability to reach 5 inches anteriorly and reach within 10 inches from the floor unilateral UE support on RW with supervision to indicate a decrease in his risk of falling.     Baseline MET 10/24/16   Time 1   Period Months   Status Achieved     PT SHORT TERM GOAL #3   Title Patient sit to/from stand and ambulates 50' around furniture with std RW & Bil. prostheses around furniture with supervision.    Baseline MET 10/24/16   Time 1   Period Months   Status Achieved     PT SHORT TERM GOAL #4   Title Patient ambulates 200 feet on indoor surfaces with std rolling walker and supervision    Baseline 10/24/16 progressing but distance not met: Pt ambulated 155' with rollator walker with supervision.    Status Not Met     PT SHORT TERM GOAL #5   Title Patient negotiates ramp & curb with std rolling walker & prostheses with minimal guard.    Baseline MET 10/24/16   Status Achieved           PT Long Term Goals - 09/27/16 1134      PT LONG TERM GOAL #1   Title Patient will verbalize understanding and return demonstration of ongoing HEP program. (All LTGs TARGET DATE: 11/22/2016)    Time 8   Period Weeks   Status  On-going   Target Date 11/22/16     PT LONG TERM GOAL #2   Title Patient  will ambulate 200 feet on firm, paved outdoor surfaces with family support to indicate a decrease risk of falling when returning to community ambulation with family support.     Time 8   Period Weeks   Status On-going   Target Date 11/22/16     PT LONG TERM GOAL #3   Title Patient ambulates 24' with standard RW and prostheses around furniture modified independent.     Time 8   Period Weeks   Status On-going   Target Date 11/22/16     PT LONG TERM GOAL #4   Title Patient will be able to ambulate over ramp/curb with rollator style walker with family assistance to indicate a decrease in his risk of falling when returning to community ambulation with family.     Time 8   Period Weeks   Status On-going   Target Date 11/22/16     PT LONG TERM GOAL #5   Title Patient will demonstrate ability to reach 8 inches anteriorly and reach within 8 inches from the floor unilateral UE support on RW with supervision to indicate a decrease in his risk of falling.     Time 8   Period Weeks   Status On-going   Target Date 11/22/16               Plan - 11-11-16 1324    Clinical Impression Statement Today's skilled session continued to focus on mobility with prostheses and RW (used RW vs rollator today at pt request due to finger pain with flexion). Pt is making slow, steady progress toward goals and should benefit from continued PT to progress toward unmet goals.   Rehab Potential Good   Clinical Impairments Affecting Rehab Potential bilateral transtibial amputations, anemia, asthma, contracture of L knee, decubitus ulcer of sacral region, DM with PVD, ESRD, glaucoma, heart murmur, HTN, hypothyroidism, multiple myeloma, PAD   PT Frequency 2x / week   PT Duration Other (comment)  9 weeks (60 days)   PT Treatment/Interventions ADLs/Self Care Home Management;Neuromuscular re-education;Balance training;Therapeutic  exercise;Therapeutic activities;Functional mobility training;Stair training;Gait training;DME Instruction;Patient/family education;Prosthetic Training;Manual techniques;Scar mobilization;Passive range of motion   PT Next Visit Plan  Prosthetic gait & balance with rollator for community activities and std RW for household.    Consulted and Agree with Plan of Care Patient;Family member/caregiver   Family Member Consulted dtr-Cory      Patient will benefit from skilled therapeutic intervention in order to improve the following deficits and impairments:  Abnormal gait, Decreased activity tolerance, Decreased balance, Decreased coordination, Decreased range of motion, Decreased safety awareness, Decreased knowledge of use of DME, Decreased knowledge of precautions, Decreased endurance, Decreased strength, Difficulty walking, Prosthetic Dependency, Postural dysfunction  Visit Diagnosis: Muscle weakness (generalized)  Unsteadiness on feet  Other abnormalities of gait and mobility       G-Codes - 11/11/2016 1324    Functional Assessment Tool Used (Outpatient Only) Pt able to maintain static standing balance with RW with single UE support x 70.12 secs at S level  sit/stand transfers with supervision and UE assist on arm rests   Functional Limitation Changing and maintaining body position      Problem List Patient Active Problem List   Diagnosis Date Noted  . Dyslipidemia 11/21/2015  . Diabetic retinopathy ([place])- left eye 10/02/2015  . Complications, amputation stump late ([place]) 05/20/2014  . Weakness generalized 04/07/2014  . Sepsis ([place]) 04/07/2014  . Decubitus ulcer, stage II 04/07/2014  . Decubitus ulcer of ankle   . Fatigue   .  Wound infection 04/06/2014  . Decubitus ulcer of sacral region, stage 2 03/07/2014  . Glaucoma 03/07/2014  . Gout 03/07/2014  . Below knee amputation status ([place]) 02/18/2014  . Osteomyelitis ([place]) 01/04/2014  . Phantom limb ([place]) 12/14/2013  . Type 2  diabetes mellitus with diabetic foot infection ([place])   . Diabetes mellitus with peripheral vascular disease ([place])   . Asthma 05/17/2013  . S/P bilateral below knee amputation ([place]) 05/17/2013  . History of MRSA infection 04/22/2013  . Peripheral vascular disease ([place]) 11/19/2012  . End-stage renal disease on hemodialysis ([place]) 05/05/2012  . Kahler disease ([place]) 03/30/2012  . Anemia in chronic kidney disease 03/19/2012  . Hypothyroidism following radioiodine therapy 03/19/2012  . Anemia, iron deficiency 03/19/2012  . Hypertension 03/18/2012  . Chronic kidney disease (CKD), stage V ([place]) 03/18/2012  . Cardiac conduction disorder 03/18/2012  . MGUS (monoclonal gammopathy of unknown significance) 02/28/2011  . Monoclonal paraproteinemia 02/28/2011    Willow Ora, PTA, [facility] [address], [place], [place] [address] [phone] 11/01/16, 10:08 AM   Name: Jack [name] MRN: [id number] Date of Birth: 1945/02/02      G-Codes - 2016-11-08 1324    Functional Assessment Tool Used (Outpatient Only) Pt able to maintain static standing balance with RW with single UE support x 70.12 secs at S level  sit/stand transfers with supervision and UE assist on arm rests   Functional Limitation Changing and maintaining body position   Changing and Maintaining Body Position Current Status [phone]) At least 40 percent but less than 60 percent impaired, limited or restricted   Changing and Maintaining Body Position Goal Status [phone]) At least 40 percent but less than 60 percent impaired, limited or restricted     Physical Therapy Progress Note  Dates of Reporting Period: 08/08/16 to 11-08-16  Objective Reports of Subjective Statement: Patient reports starting to initiate community outings with family without having to use SCAT for non-medical things.   Objective Measurements: See above  Goal Update: See above  Plan: continue established plan of  care.  Reason Skilled Services are Required: Patient still requires skilled instruction to progress to safely ambulating in his home modified independent with rolling walker and basic community with rollator walker for seat to rest with family assist.   Jamey Reas, PT, DPT PT Specializing in [place] 11/01/16 1:25 PM Phone:  [phone]  Fax:  [phone] [facility] [address] [place],  [address]

## 2016-11-04 DIAGNOSIS — N2581 Secondary hyperparathyroidism of renal origin: Secondary | ICD-10-CM | POA: Diagnosis not present

## 2016-11-04 DIAGNOSIS — T82598A Other mechanical complication of other cardiac and vascular devices and implants, initial encounter: Secondary | ICD-10-CM | POA: Diagnosis not present

## 2016-11-04 DIAGNOSIS — D689 Coagulation defect, unspecified: Secondary | ICD-10-CM | POA: Diagnosis not present

## 2016-11-04 DIAGNOSIS — E1029 Type 1 diabetes mellitus with other diabetic kidney complication: Secondary | ICD-10-CM | POA: Diagnosis not present

## 2016-11-04 DIAGNOSIS — N186 End stage renal disease: Secondary | ICD-10-CM | POA: Diagnosis not present

## 2016-11-04 DIAGNOSIS — D631 Anemia in chronic kidney disease: Secondary | ICD-10-CM | POA: Diagnosis not present

## 2016-11-05 ENCOUNTER — Ambulatory Visit: Payer: Medicare Other | Admitting: Occupational Therapy

## 2016-11-05 ENCOUNTER — Encounter: Payer: Self-pay | Admitting: Physical Therapy

## 2016-11-05 ENCOUNTER — Ambulatory Visit: Payer: Medicare Other | Admitting: Physical Therapy

## 2016-11-05 DIAGNOSIS — M6281 Muscle weakness (generalized): Secondary | ICD-10-CM

## 2016-11-05 DIAGNOSIS — R2689 Other abnormalities of gait and mobility: Secondary | ICD-10-CM | POA: Diagnosis not present

## 2016-11-05 DIAGNOSIS — R278 Other lack of coordination: Secondary | ICD-10-CM

## 2016-11-05 DIAGNOSIS — R2681 Unsteadiness on feet: Secondary | ICD-10-CM

## 2016-11-05 DIAGNOSIS — M25641 Stiffness of right hand, not elsewhere classified: Secondary | ICD-10-CM

## 2016-11-05 NOTE — Therapy (Signed)
[facility] [address] [place], [place], [address] Phone: [phone]   Fax:  [phone]  Physical Therapy Treatment  Patient Details  Name: Jack [name] MRN: [id number] Date of Birth: 12-22-1945 Referring Provider: Meridee Score MD   Encounter Date: 11/05/2016      PT End of Session - 11/05/16 1045    Visit Number 21   Number of Visits 24   Date for PT Re-Evaluation 11/23/15   Authorization Type Medicare & G-codes every 10th visit    PT Start Time 0935   PT Stop Time 1015   PT Time Calculation (min) 40 min   Equipment Utilized During Treatment Gait belt   Activity Tolerance Patient tolerated treatment well;Patient limited by fatigue   Behavior During Therapy [facility] for tasks assessed/performed      Past Medical History:  Diagnosis Date  . Anemia   . Asthma   . Contracture of joint    left knee  . Decubitus ulcer of sacral region, stage 2 03/07/2014  . Diabetes mellitus with peripheral vascular disease ([place])   . End-stage renal disease on hemodialysis [facility])    Started HD March 2014.  Cause of ESRD was DM.  Gets HD at [facility] on [address] on MWF schedule.   Marland Kitchen ESRD on hemodialysis ([place])   . Gangrene ([place])    right BKA  . Gangrene of foot ([place])   . Glaucoma   . Glaucoma 03/07/2014  . Heart murmur   . History of MRSA infection 04/22/2013   Bacteremia assoc w L foot wound infection Mar 2015   . Hyperparathyroidism, secondary renal ([place])   . Hypertension   . Hypothyroidism   . MRSA bacteremia   . Multiple myeloma, without mention of having achieved remission 03/30/2012   Cytogenetic neg on 03/23/2012.  Marland Kitchen Peripheral arterial disease ([place])   . Peripheral vascular disease, unspecified ([place]) 11/19/2012   In the past had R foot toe amps then R TMA. In 2015 had left foot toe amp > then TMA >then L BKA on 05/14/13   . Pneumonia    2012  . Thyroid disease    hyperparathyroidism    Past Surgical History:   Procedure Laterality Date  . ABDOMINAL AORTAGRAM Bilateral 11/06/2012   Procedure: ABDOMINAL AORTAGRAM;  Surgeon: Elam Dutch, MD;  [facility];  Service: Cardiovascular;  Laterality: Bilateral;  . AMPUTATION Right 11/10/2012   Procedure: AMPUTATION FIRST and SECOND TOES Right Foot;  Surgeon: Elam Dutch, MD;  Location: [place];  Service: Vascular;  Laterality: Right;  . AMPUTATION Left 04/07/2013   Procedure: AMPUTATION DIGIT- LEFT 1ST TOE;  Surgeon: Mal Misty, MD;  Location: [place];  Service: Vascular;  Laterality: Left;  . AMPUTATION Left 04/26/2013   Procedure: Left Foot Transmetatarsal Amputation;  Surgeon: Newt Minion, MD;  Location: [place];  Service: Orthopedics;  Laterality: Left;  . AMPUTATION Left 05/14/2013   Procedure: AMPUTATION BELOW KNEE;  Surgeon: Newt Minion, MD;  Location: [place];  Service: Orthopedics;  Laterality: Left;  Left Below Knee Amputation  . AMPUTATION Left 07/23/2013   Procedure: AMPUTATION BELOW KNEE;  Surgeon: Newt Minion, MD;  Location: [place];  Service: Orthopedics;  Laterality: Left;  Left Below Knee Amputation Revision  . AMPUTATION Right 02/18/2014   Procedure: AMPUTATION BELOW KNEE;  Surgeon: Newt Minion, MD;  Location: [place];  Service: Orthopedics;  Laterality: Right;  . AMPUTATION Right 08/20/2016   Procedure: RIGHT  LONG FINGER AMPUTATION;  Surgeon: Leanora Cover, MD;  Location: [place];  Service: Orthopedics;  Laterality: Right;  . AV FISTULA PLACEMENT Left 03/25/2012   Procedure: ARTERIOVENOUS (AV) FISTULA CREATION;  Surgeon: Mal Misty, MD;  Location: [place];  Service: Vascular;  Laterality: Left;  . BELOW KNEE LEG AMPUTATION Left 05/14/2013   DR [name]  . BELOW KNEE LEG AMPUTATION Right 02/18/2014   dr duda  . CARDIAC CATHETERIZATION     approx 30 years ago  . CERVICAL DISC SURGERY    . EYE SURGERY Bilateral    CATARACTS  . I&D EXTREMITY Left 04/22/2013   Procedure: IRRIGATION AND DEBRIDEMENT LEFT FIRST TOE  AMPUTATION WOUND ;  Surgeon: Mal Misty, MD;  Location: [place];  Service: Vascular;  Laterality: Left;  . I&D EXTREMITY Right 01/05/2014   Procedure: IRRIGATION AND DEBRIDEMENT Right Heel Ulcer;  Surgeon: Mcarthur Rossetti, MD;  Location: [place];  Service: Orthopedics;  Laterality: Right;  Surgeon Available after 5PM  . INSERTION OF DIALYSIS CATHETER Right 03/19/2012   Procedure: INSERTION OF DIALYSIS CATHETER;  Surgeon: Mal Misty, MD;  Location: [place];  Service: Vascular;  Laterality: Right;  Right Internal Jugular  . LIGATION OF COMPETING BRANCHES OF ARTERIOVENOUS FISTULA Left 05/08/2012   Procedure: LIGATION OF COMPETING BRANCHES OF ARTERIOVENOUS FISTULA;  Surgeon: Mal Misty, MD;  Location: [place];  Service: Vascular;  Laterality: Left;  Ultrasound guided  . REPAIR QUADRICEPS/HAMSTRING MUSCLES Left 05/20/2014   Procedure: Left Hamstring Release;  Surgeon: Newt Minion, MD;  Location: [place];  Service: Orthopedics;  Laterality: Left;  . REVISON OF ARTERIOVENOUS FISTULA Left 01/30/2016   Procedure: CEPHALIC VEIN TURNDOWN TO LEFT UPPER ARM;  Surgeon: Elam Dutch, MD;  Location: [place];  Service: Vascular;  Laterality: Left;  . STUMP REVISION Right 05/20/2014   Procedure: Revision Right Below Knee Amputation;  Surgeon: Newt Minion, MD;  Location: [place];  Service: Orthopedics;  Laterality: Right;  . TEE WITHOUT CARDIOVERSION N/A 04/20/2013   Procedure: TRANSESOPHAGEAL ECHOCARDIOGRAM (TEE);  Surgeon: Josue Hector, MD;  Location: [facility] ENDOSCOPY;  Service: Cardiovascular;  Laterality: N/A;  . THYROIDECTOMY    . TOE AMPUTATION     D/C 04-30-13  . TRANSMETATARSAL AMPUTATION Left 12/16/2012   Procedure: TRANSMETATARSAL AMPUTATION AND VAC PLACEMENT;  Surgeon: Elam Dutch, MD;  Location: [place];  Service: Vascular;  Laterality: Left;    There were no vitals filed for this visit.      Subjective Assessment - 11/05/16 0938    Subjective No new complaints, no falls to report, just a  little finger pain after OT "bending it hurts."    Patient is accompained by: Family member   Pertinent History bilateral transtibial amputations, anemia, asthma, contracture of L knee, decubitus ulcer of sacral region, DM with PVD, ESRD, glaucoma, heart murmur, HTN, hypothyroidism, multiple myeloma, PAD   Limitations Lifting;Standing;Walking;House hold activities   Currently in Pain? Yes   Pain Score 2    Pain Location Finger (Comment which one)   Pain Orientation Right   Pain Descriptors / Indicators Aching   Pain Type Acute pain   Pain Onset 1 to 4 weeks ago   Pain Frequency Intermittent   Aggravating Factors  Bending it    Effect of Pain on Daily Activities difficulty using right hand   Multiple Pain Sites No            [facility] Adult PT Treatment/Exercise - 11/05/16 0001  Transfers   Transfers Sit to Stand;Stand to Sit   Sit to Stand 4: Min guard   Sit to Stand Details Verbal cues for sequencing;Verbal cues for technique;Verbal cues for safe use of DME/AE   Sit to Stand Details (indicate cue type and reason) Pt requires increased time to stand/sit, Cues for hand placement/technique   Stand to Sit 4: Min guard   Stand to Sit Details (indicate cue type and reason) Verbal cues for safe use of DME/AE;Verbal cues for sequencing;Verbal cues for technique     Ambulation/Gait   Ambulation/Gait Yes   Ambulation/Gait Assistance 5: Supervision   Ambulation/Gait Assistance Details Gait with RW, from wheelchair to parallel bars, cues for posture and staying closer to waker.    Ambulation Distance (Feet) 25 Feet   Assistive device Rolling walker   Gait Pattern Step-through pattern;Decreased hip/knee flexion - right;Decreased hip/knee flexion - left;Trunk flexed;Poor foot clearance - right;Wide base of support   Ambulation Surface Level;Indoor     Balance   Balance Assessed No     High Level Balance   High Level Balance Activities Side stepping;Backward walking;Marching forwards    High Level Balance Comments In paralell bars: Forward marching/backwards walking x5 ( x1 seated rest break after 3 reps), Side stepping x 5 (x1 seated rest break after 2 reps). Continuous cues for posture and looking forward     Exercises   Exercises Knee/Hip   Other Exercises  SLR: seated in wheelchair x10 reps, had pt bring toes to hand for heigth reference. Pt performed these well, cues to slow down      Knee/Hip Exercises: Seated   Marching AROM;Both;10 reps;Limitations   Marching Limitations Seated in wheelchair, cues to bring knees higher   Hamstring Curl AROM;Both;10 reps;Limitations   Hamstring Limitations Seated in wheelchair with foot rests removed, keeping foot off ground bending the knee as far as the prosthesis would allow.    Abduction/Adduction  AROM;Both;10 reps;Limitations   Abd/Adduction Limitations Seated in wheelchair, RTB tied above knees             PT Education - 11/05/16 1043    Education provided Yes   Education Details Continue with HEP to decrease fatigue and soreness    Person(s) Educated Patient;Child(ren)   Methods Explanation   Comprehension Verbalized understanding          PT Short Term Goals - 11/05/16 1044      PT SHORT TERM GOAL #1   Title Patient will verbalize understanding and return demonstration of updated HEP. (All STGs Target Date 10/24/16)   Baseline MET 10/24/16   Time 1   Period Months   Status Achieved     PT SHORT TERM GOAL #2   Title Patient will demonstrate ability to reach 5 inches anteriorly and reach within 10 inches from the floor unilateral UE support on RW with supervision to indicate a decrease in his risk of falling.     Baseline MET 10/24/16   Time 1   Period Months   Status Achieved     PT SHORT TERM GOAL #3   Title Patient sit to/from stand and ambulates 50' around furniture with std RW & Bil. prostheses around furniture with supervision.    Baseline MET 10/24/16   Time 1   Period Months   Status Achieved      PT SHORT TERM GOAL #4   Title Patient ambulates 200 feet on indoor surfaces with std rolling walker and supervision    Baseline 10/24/16 progressing  but distance not met: Pt ambulated 155' with rollator walker with supervision.    Status Not Met     PT SHORT TERM GOAL #5   Title Patient negotiates ramp & curb with std rolling walker & prostheses with minimal guard.    Baseline MET 10/24/16   Status Achieved           PT Long Term Goals - 11/05/16 1044      PT LONG TERM GOAL #1   Title Patient will verbalize understanding and return demonstration of ongoing HEP program. (All LTGs TARGET DATE: 11/22/2016)    Baseline 09/24/16: can verbalize HEP, has not been consistent with performance   Time 8   Period Weeks   Status On-going     PT LONG TERM GOAL #2   Title Patient will ambulate 200 feet on firm, paved outdoor surfaces with family support to indicate a decrease risk of falling when returning to community ambulation with family support.     Baseline 09/24/16: pt fatigued after 115 feet on indoor surfaces, did not test outdoors   Time 8   Period Weeks   Status On-going     PT LONG TERM GOAL #3   Title Patient ambulates 66' with standard RW and prostheses around furniture modified independent.     Baseline 09/24/16: unable to test due to time constraints and pt fatigue   Time 8   Period Weeks   Status On-going     PT LONG TERM GOAL #4   Title Patient will be able to ambulate over ramp/curb with rollator style walker with family assistance to indicate a decrease in his risk of falling when returning to community ambulation with family.     Baseline 09/24/16: pt needed up to mod assist for both today with RW   Time 8   Period Weeks   Status On-going     PT LONG TERM GOAL #5   Title Patient will demonstrate ability to reach 8 inches anteriorly and reach within 8 inches from the floor unilateral UE support on RW with supervision to indicate a decrease in his risk of falling.      Time 8   Period Weeks   Status On-going            Plan - 11/05/16 1046    Clinical Impression Statement Pt tolerated treatment but was greatly limited by fatigue. Todays session focused on dynamic gait/balance and LE strengthening. Pt is slowly progressing towards goals and should benefit from continued PT to progress toward unmet goals.    Clinical Presentation Evolving   Rehab Potential Good   Clinical Impairments Affecting Rehab Potential bilateral transtibial amputations, anemia, asthma, contracture of L knee, decubitus ulcer of sacral region, DM with PVD, ESRD, glaucoma, heart murmur, HTN, hypothyroidism, multiple myeloma, PAD   PT Frequency 2x / week   PT Duration Other (comment)  9 weeks (60 days)   PT Treatment/Interventions ADLs/Self Care Home Management;Neuromuscular re-education;Balance training;Therapeutic exercise;Therapeutic activities;Functional mobility training;Stair training;Gait training;DME Instruction;Patient/family education;Prosthetic Training;Manual techniques;Scar mobilization;Passive range of motion   PT Next Visit Plan  Prosthetic gait & balance with rollator, gait training with less rest breaks to increase endurance and limit fatigue.    Consulted and Agree with Plan of Care Patient;Family member/caregiver   Family Member Consulted dtr-Cory      Patient will benefit from skilled therapeutic intervention in order to improve the following deficits and impairments:  Abnormal gait, Decreased activity tolerance, Decreased balance, Decreased coordination, Decreased range of motion,  Decreased safety awareness, Decreased knowledge of use of DME, Decreased knowledge of precautions, Decreased endurance, Decreased strength, Difficulty walking, Prosthetic Dependency, Postural dysfunction  Visit Diagnosis: Muscle weakness (generalized)  Unsteadiness on feet  Other abnormalities of gait and mobility     Problem List Patient Active Problem List   Diagnosis Date  Noted  . Dyslipidemia 11/21/2015  . Diabetic retinopathy ([place])- left eye 10/02/2015  . Complications, amputation stump late ([place]) 05/20/2014  . Weakness generalized 04/07/2014  . Sepsis ([place]) 04/07/2014  . Decubitus ulcer, stage II 04/07/2014  . Decubitus ulcer of ankle   . Fatigue   . Wound infection 04/06/2014  . Decubitus ulcer of sacral region, stage 2 03/07/2014  . Glaucoma 03/07/2014  . Gout 03/07/2014  . Below knee amputation status ([place]) 02/18/2014  . Osteomyelitis ([place]) 01/04/2014  . Phantom limb ([place]) 12/14/2013  . Type 2 diabetes mellitus with diabetic foot infection ([place])   . Diabetes mellitus with peripheral vascular disease ([place])   . Asthma 05/17/2013  . S/P bilateral below knee amputation ([place]) 05/17/2013  . History of MRSA infection 04/22/2013  . Peripheral vascular disease ([place]) 11/19/2012  . End-stage renal disease on hemodialysis ([place]) 05/05/2012  . Kahler disease ([place]) 03/30/2012  . Anemia in chronic kidney disease 03/19/2012  . Hypothyroidism following radioiodine therapy 03/19/2012  . Anemia, iron deficiency 03/19/2012  . Hypertension 03/18/2012  . Chronic kidney disease (CKD), stage V ([place]) 03/18/2012  . Cardiac conduction disorder 03/18/2012  . MGUS (monoclonal gammopathy of unknown significance) 02/28/2011  . Monoclonal paraproteinemia 02/28/2011   Jere Vanburen, SPTA  Mubashir Mallek 11/05/2016, 10:51 AM  [facility] [address] [place], [place], [address] Phone: [phone]   Fax:  [phone]  Name: Jack [name] MRN: [id number] Date of Birth: July 15, 1945

## 2016-11-05 NOTE — Therapy (Signed)
[facility] [address] [place], [place], [address] Phone: [phone]   Fax:  [phone]  Occupational Therapy Treatment  Patient Details  Name: Jack [name] MRN: [id number] Date of Birth: 11/10/1945 Referring Provider: Dr. Leanora Cover  Encounter Date: 11/05/2016      OT End of Session - 11/05/16 0909    Visit Number 7   Number of Visits 17   Date for OT Re-Evaluation 11/30/16   Authorization Type UHC MCR   Authorization - Visit Number 7   Authorization - Number of Visits 10   OT Start Time 8841   OT Stop Time 0930   OT Time Calculation (min) 43 min   Activity Tolerance Patient tolerated treatment well   Behavior During Therapy [facility] for tasks assessed/performed      Past Medical History:  Diagnosis Date  . Anemia   . Asthma   . Contracture of joint    left knee  . Decubitus ulcer of sacral region, stage 2 03/07/2014  . Diabetes mellitus with peripheral vascular disease ([place])   . End-stage renal disease on hemodialysis [facility])    Started HD March 2014.  Cause of ESRD was DM.  Gets HD at [facility] on [address] on MWF schedule.   Marland Kitchen ESRD on hemodialysis ([place])   . Gangrene ([place])    right BKA  . Gangrene of foot ([place])   . Glaucoma   . Glaucoma 03/07/2014  . Heart murmur   . History of MRSA infection 04/22/2013   Bacteremia assoc w L foot wound infection Mar 2015   . Hyperparathyroidism, secondary renal ([place])   . Hypertension   . Hypothyroidism   . MRSA bacteremia   . Multiple myeloma, without mention of having achieved remission 03/30/2012   Cytogenetic neg on 03/23/2012.  Marland Kitchen Peripheral arterial disease ([place])   . Peripheral vascular disease, unspecified ([place]) 11/19/2012   In the past had R foot toe amps then R TMA. In 2015 had left foot toe amp > then TMA >then L BKA on 05/14/13   . Pneumonia    2012  . Thyroid disease    hyperparathyroidism    Past Surgical History:  Procedure Laterality Date   . ABDOMINAL AORTAGRAM Bilateral 11/06/2012   Procedure: ABDOMINAL AORTAGRAM;  Surgeon: Elam Dutch, MD;  Location: [facility] CATH LAB;  Service: Cardiovascular;  Laterality: Bilateral;  . AMPUTATION Right 11/10/2012   Procedure: AMPUTATION FIRST and SECOND TOES Right Foot;  Surgeon: Elam Dutch, MD;  Location: [place];  Service: Vascular;  Laterality: Right;  . AMPUTATION Left 04/07/2013   Procedure: AMPUTATION DIGIT- LEFT 1ST TOE;  Surgeon: Mal Misty, MD;  Location: [place];  Service: Vascular;  Laterality: Left;  . AMPUTATION Left 04/26/2013   Procedure: Left Foot Transmetatarsal Amputation;  Surgeon: Newt Minion, MD;  Location: [place];  Service: Orthopedics;  Laterality: Left;  . AMPUTATION Left 05/14/2013   Procedure: AMPUTATION BELOW KNEE;  Surgeon: Newt Minion, MD;  Location: [place];  Service: Orthopedics;  Laterality: Left;  Left Below Knee Amputation  . AMPUTATION Left 07/23/2013   Procedure: AMPUTATION BELOW KNEE;  Surgeon: Newt Minion, MD;  Location: [place];  Service: Orthopedics;  Laterality: Left;  Left Below Knee Amputation Revision  . AMPUTATION Right 02/18/2014   Procedure: AMPUTATION BELOW KNEE;  Surgeon: Newt Minion, MD;  Location: [place];  Service: Orthopedics;  Laterality: Right;  . AMPUTATION Right 08/20/2016   Procedure: RIGHT LONG FINGER  AMPUTATION;  Surgeon: Leanora Cover, MD;  Location: [place];  Service: Orthopedics;  Laterality: Right;  . AV FISTULA PLACEMENT Left 03/25/2012   Procedure: ARTERIOVENOUS (AV) FISTULA CREATION;  Surgeon: Mal Misty, MD;  Location: [place];  Service: Vascular;  Laterality: Left;  . BELOW KNEE LEG AMPUTATION Left 05/14/2013   DR [name]  . BELOW KNEE LEG AMPUTATION Right 02/18/2014   dr duda  . CARDIAC CATHETERIZATION     approx 30 years ago  . CERVICAL DISC SURGERY    . EYE SURGERY Bilateral    CATARACTS  . I&D EXTREMITY Left 04/22/2013   Procedure: IRRIGATION AND DEBRIDEMENT LEFT FIRST TOE AMPUTATION WOUND ;  Surgeon:  Mal Misty, MD;  Location: [facility];  Service: Vascular;  Laterality: Left;  . I&D EXTREMITY Right 01/05/2014   Procedure: IRRIGATION AND DEBRIDEMENT Right Heel Ulcer;  Surgeon: Mcarthur Rossetti, MD;  Location: [place];  Service: Orthopedics;  Laterality: Right;  Surgeon Available after 5PM  . INSERTION OF DIALYSIS CATHETER Right 03/19/2012   Procedure: INSERTION OF DIALYSIS CATHETER;  Surgeon: Mal Misty, MD;  Location: [place];  Service: Vascular;  Laterality: Right;  Right Internal Jugular  . LIGATION OF COMPETING BRANCHES OF ARTERIOVENOUS FISTULA Left 05/08/2012   Procedure: LIGATION OF COMPETING BRANCHES OF ARTERIOVENOUS FISTULA;  Surgeon: Mal Misty, MD;  Location: [place];  Service: Vascular;  Laterality: Left;  Ultrasound guided  . REPAIR QUADRICEPS/HAMSTRING MUSCLES Left 05/20/2014   Procedure: Left Hamstring Release;  Surgeon: Newt Minion, MD;  Location: [place];  Service: Orthopedics;  Laterality: Left;  . REVISON OF ARTERIOVENOUS FISTULA Left 01/30/2016   Procedure: CEPHALIC VEIN TURNDOWN TO LEFT UPPER ARM;  Surgeon: Elam Dutch, MD;  Location: [place];  Service: Vascular;  Laterality: Left;  . STUMP REVISION Right 05/20/2014   Procedure: Revision Right Below Knee Amputation;  Surgeon: Newt Minion, MD;  Location: [place];  Service: Orthopedics;  Laterality: Right;  . TEE WITHOUT CARDIOVERSION N/A 04/20/2013   Procedure: TRANSESOPHAGEAL ECHOCARDIOGRAM (TEE);  Surgeon: Josue Hector, MD;  Location: [facility] ENDOSCOPY;  Service: Cardiovascular;  Laterality: N/A;  . THYROIDECTOMY    . TOE AMPUTATION     D/C 04-30-13  . TRANSMETATARSAL AMPUTATION Left 12/16/2012   Procedure: TRANSMETATARSAL AMPUTATION AND VAC PLACEMENT;  Surgeon: Elam Dutch, MD;  Location: [place];  Service: Vascular;  Laterality: Left;    There were no vitals filed for this visit.      Subjective Assessment - 11/05/16 1312    Pertinent History recent right middle finger tip amputation through the middle phalanx,  bilateral transtibial amputations, anemia, asthma, contracture of L knee, decubitus ulcer of sacral region, DM with PVD, ESRD, glaucoma, heart murmur, HTN, hypothyroidism, multiple myeloma, PAD   Patient Stated Goals improve ROM and function in right hand   Currently in Pain? Yes   Pain Score 2    Pain Location Finger (Comment which one)   Pain Orientation Right   Pain Descriptors / Indicators Aching   Pain Type Acute pain   Pain Onset 1 to 4 weeks ago   Pain Frequency Intermittent   Aggravating Factors  Bending   Pain Relieving Factors soaking in warm water                      Pt's hand was cleaned with soap and water avoiding finger tip and dried throughly. Pt has small scab at fingertip with new pink healing  skin skin around edges mild maceration present. Therapist reinforced importance of drying finger thoroughly.Fingertip was dressed with stockinette, and stockinette was folded over and taped in place. Pt was instructed to use a large latex glove over his right hand when riding SCAT for infection prevention, however he was instructed to remove glove once he got home.Marland Kitchen  Pt performed gentle A/ROM finger flexion, PIP flexion and MP flexion with RUE.Pt was instructed to perform at least 3x week. Placing grooved pegs with with RUE for in hand manipulation and fine motor coordination, mod/ max difficutly. (glove over right hand for infection prevention. Pt placed graded clothespins on antennae with index, ring and small fingers avoiding use of middle finger.             OT Short Term Goals - 11/05/16 0911      OT SHORT TERM GOAL #1   Title I with inital HEP   Status Achieved     OT SHORT TERM GOAL #2   Title Pt / caregiver will verbalize understanding of daily dressing changes, wound care and s/s of infection   Status Achieved     OT SHORT TERM GOAL #3   Title Pt/ caregiver will verbalize understanding of AE/ DME in order to maximize pt safety and indepenence  with ADLs/ IADLs.   Status On-going     OT SHORT TERM GOAL #4   Title Pt will resume performance of all basic ADLs at a modified independent level.   Status On-going  90%     OT SHORT TERM GOAL #5   Title Pt will demonstrate adequate RUE strength in order to donn prosthesis/ liners independently.   Status On-going           OT Long Term Goals - 10/01/16 1057      OT LONG TERM GOAL #1   Title I with updated HEP   Time 8   Period Weeks   Status New   Target Date 11/30/16     OT LONG TERM GOAL #2   Title Pt will report using RUE consistently for ADLS at least 31%S for ADLs/ IADLs with pain less than or equal to 2/10.   Time 8   Period Weeks   Status New     OT LONG TERM GOAL #3   Title Pt will demonstrate RUE grip strength of at least 30 lbs for increased Functional use during ADLs/ IADLs.   Time 8   Period Weeks   Status New     OT LONG TERM GOAL #4   Title Pt will resume performance of light home management/ cooking at a modified independent level   Time 8   Period Weeks   Status New               Plan - 11/05/16 0911    Clinical Impression Statement Pt is progressing towards goals.. Pt demonstrates improving A/ROM and light functional use.   Rehab Potential Good   Current Impairments/barriers affecting progress: pain   OT Frequency 2x / week   OT Duration 8 weeks   OT Treatment/Interventions Self-care/ADL training;Moist Heat;Fluidtherapy;DME and/or AE instruction;Splinting;Patient/family education;Therapeutic exercises;Ultrasound;Therapeutic exercise;Therapeutic activities;Cognitive remediation/compensation;Passive range of motion;Functional Mobility Training;Neuromuscular education;Iontophoresis;Cryotherapy;Electrical Stimulation;Energy conservation;Manual Therapy   Plan ROM and light functional use   Consulted and Agree with Plan of Care Patient;Family member/caregiver      Patient will benefit from skilled therapeutic intervention in order to  improve the following deficits and impairments:  Decreased range of motion, Decreased coordination, Pain,  Impaired UE functional use, Decreased knowledge of use of DME, Decreased balance, Decreased mobility, Decreased strength  Visit Diagnosis: Muscle weakness (generalized)  Other abnormalities of gait and mobility  Stiffness of joint, hand, right  Other lack of coordination    Problem List Patient Active Problem List   Diagnosis Date Noted  . Dyslipidemia 11/21/2015  . Diabetic retinopathy ([place])- left eye 10/02/2015  . Complications, amputation stump late ([place]) 05/20/2014  . Weakness generalized 04/07/2014  . Sepsis ([place]) 04/07/2014  . Decubitus ulcer, stage II 04/07/2014  . Decubitus ulcer of ankle   . Fatigue   . Wound infection 04/06/2014  . Decubitus ulcer of sacral region, stage 2 03/07/2014  . Glaucoma 03/07/2014  . Gout 03/07/2014  . Below knee amputation status ([place]) 02/18/2014  . Osteomyelitis ([place]) 01/04/2014  . Phantom limb ([place]) 12/14/2013  . Type 2 diabetes mellitus with diabetic foot infection ([place])   . Diabetes mellitus with peripheral vascular disease ([place])   . Asthma 05/17/2013  . S/P bilateral below knee amputation ([place]) 05/17/2013  . History of MRSA infection 04/22/2013  . Peripheral vascular disease ([place]) 11/19/2012  . End-stage renal disease on hemodialysis ([place]) 05/05/2012  . Kahler disease ([place]) 03/30/2012  . Anemia in chronic kidney disease 03/19/2012  . Hypothyroidism following radioiodine therapy 03/19/2012  . Anemia, iron deficiency 03/19/2012  . Hypertension 03/18/2012  . Chronic kidney disease (CKD), stage V ([place]) 03/18/2012  . Cardiac conduction disorder 03/18/2012  . MGUS (monoclonal gammopathy of unknown significance) 02/28/2011  . Monoclonal paraproteinemia 02/28/2011    Jack [name] 11/05/2016, 1:15 PM Jack [name], Jack [name] [phone] Phone: [phone] 2:56 PM 11/05/16 [facility] [address] [place], [place], [address] Phone: [phone]   Fax:  [phone]  Name: Jack [name] MRN: [id number] Date of Birth: Jul 22, 1945

## 2016-11-06 DIAGNOSIS — D689 Coagulation defect, unspecified: Secondary | ICD-10-CM | POA: Diagnosis not present

## 2016-11-06 DIAGNOSIS — N186 End stage renal disease: Secondary | ICD-10-CM | POA: Diagnosis not present

## 2016-11-06 DIAGNOSIS — D631 Anemia in chronic kidney disease: Secondary | ICD-10-CM | POA: Diagnosis not present

## 2016-11-06 DIAGNOSIS — N2581 Secondary hyperparathyroidism of renal origin: Secondary | ICD-10-CM | POA: Diagnosis not present

## 2016-11-06 DIAGNOSIS — T82598A Other mechanical complication of other cardiac and vascular devices and implants, initial encounter: Secondary | ICD-10-CM | POA: Diagnosis not present

## 2016-11-06 DIAGNOSIS — E1029 Type 1 diabetes mellitus with other diabetic kidney complication: Secondary | ICD-10-CM | POA: Diagnosis not present

## 2016-11-07 ENCOUNTER — Ambulatory Visit: Payer: Medicare Other | Admitting: Physical Therapy

## 2016-11-07 ENCOUNTER — Ambulatory Visit: Payer: Medicare Other | Admitting: Occupational Therapy

## 2016-11-08 DIAGNOSIS — T82598A Other mechanical complication of other cardiac and vascular devices and implants, initial encounter: Secondary | ICD-10-CM | POA: Diagnosis not present

## 2016-11-08 DIAGNOSIS — N186 End stage renal disease: Secondary | ICD-10-CM | POA: Diagnosis not present

## 2016-11-08 DIAGNOSIS — E1029 Type 1 diabetes mellitus with other diabetic kidney complication: Secondary | ICD-10-CM | POA: Diagnosis not present

## 2016-11-08 DIAGNOSIS — D631 Anemia in chronic kidney disease: Secondary | ICD-10-CM | POA: Diagnosis not present

## 2016-11-08 DIAGNOSIS — N2581 Secondary hyperparathyroidism of renal origin: Secondary | ICD-10-CM | POA: Diagnosis not present

## 2016-11-08 DIAGNOSIS — D689 Coagulation defect, unspecified: Secondary | ICD-10-CM | POA: Diagnosis not present

## 2016-11-11 DIAGNOSIS — T82598A Other mechanical complication of other cardiac and vascular devices and implants, initial encounter: Secondary | ICD-10-CM | POA: Diagnosis not present

## 2016-11-11 DIAGNOSIS — D689 Coagulation defect, unspecified: Secondary | ICD-10-CM | POA: Diagnosis not present

## 2016-11-11 DIAGNOSIS — N186 End stage renal disease: Secondary | ICD-10-CM | POA: Diagnosis not present

## 2016-11-11 DIAGNOSIS — D631 Anemia in chronic kidney disease: Secondary | ICD-10-CM | POA: Diagnosis not present

## 2016-11-11 DIAGNOSIS — E1029 Type 1 diabetes mellitus with other diabetic kidney complication: Secondary | ICD-10-CM | POA: Diagnosis not present

## 2016-11-11 DIAGNOSIS — N2581 Secondary hyperparathyroidism of renal origin: Secondary | ICD-10-CM | POA: Diagnosis not present

## 2016-11-12 ENCOUNTER — Ambulatory Visit: Payer: Medicare Other | Admitting: Physical Therapy

## 2016-11-12 ENCOUNTER — Encounter: Payer: Self-pay | Admitting: Physical Therapy

## 2016-11-12 ENCOUNTER — Ambulatory Visit: Payer: Medicare Other | Admitting: Occupational Therapy

## 2016-11-12 DIAGNOSIS — M6281 Muscle weakness (generalized): Secondary | ICD-10-CM

## 2016-11-12 DIAGNOSIS — R278 Other lack of coordination: Secondary | ICD-10-CM | POA: Diagnosis not present

## 2016-11-12 DIAGNOSIS — R2689 Other abnormalities of gait and mobility: Secondary | ICD-10-CM

## 2016-11-12 DIAGNOSIS — R2681 Unsteadiness on feet: Secondary | ICD-10-CM | POA: Diagnosis not present

## 2016-11-12 DIAGNOSIS — M25641 Stiffness of right hand, not elsewhere classified: Secondary | ICD-10-CM | POA: Diagnosis not present

## 2016-11-12 NOTE — Patient Instructions (Signed)
Put a glove over your right hand to keep it clean during this activity. Practice stacking pennies with your right hand, then pick up the stack and push one penny out of your hand at a time  Practice flipping and dealing cards with your right hand  10-20 mins 1x day

## 2016-11-12 NOTE — Therapy (Signed)
[facility] [address] [place], [place], [address] Phone: [phone]   Fax:  [phone]  Occupational Therapy Treatment  Patient Details  Name: Jack [name] MRN: [id number] Date of Birth: 1945-03-08 Referring Provider: Dr. Leanora Cover  Encounter Date: 11/12/2016      OT End of Session - 11/12/16 1254    Visit Number 8   Number of Visits 17   Date for OT Re-Evaluation 11/30/16   Authorization Type UHC MCR   Authorization - Visit Number 8   Authorization - Number of Visits 10   OT Start Time 0848   OT Stop Time 0930   OT Time Calculation (min) 42 min      Past Medical History:  Diagnosis Date  . Anemia   . Asthma   . Contracture of joint    left knee  . Decubitus ulcer of sacral region, stage 2 03/07/2014  . Diabetes mellitus with peripheral vascular disease ([place])   . End-stage renal disease on hemodialysis [facility])    Started HD March 2014.  Cause of ESRD was DM.  Gets HD at [facility] on [address] on MWF schedule.   Marland Kitchen ESRD on hemodialysis ([place])   . Gangrene ([place])    right BKA  . Gangrene of foot ([place])   . Glaucoma   . Glaucoma 03/07/2014  . Heart murmur   . History of MRSA infection 04/22/2013   Bacteremia assoc w L foot wound infection Mar 2015   . Hyperparathyroidism, secondary renal ([place])   . Hypertension   . Hypothyroidism   . MRSA bacteremia   . Multiple myeloma, without mention of having achieved remission 03/30/2012   Cytogenetic neg on 03/23/2012.  Marland Kitchen Peripheral arterial disease ([place])   . Peripheral vascular disease, unspecified ([place]) 11/19/2012   In the past had R foot toe amps then R TMA. In 2015 had left foot toe amp > then TMA >then L BKA on 05/14/13   . Pneumonia    2012  . Thyroid disease    hyperparathyroidism    Past Surgical History:  Procedure Laterality Date  . ABDOMINAL AORTAGRAM Bilateral 11/06/2012   Procedure: ABDOMINAL AORTAGRAM;  Surgeon: Elam Dutch, MD;   Location: [facility] CATH LAB;  Service: Cardiovascular;  Laterality: Bilateral;  . AMPUTATION Right 11/10/2012   Procedure: AMPUTATION FIRST and SECOND TOES Right Foot;  Surgeon: Elam Dutch, MD;  Location: [place];  Service: Vascular;  Laterality: Right;  . AMPUTATION Left 04/07/2013   Procedure: AMPUTATION DIGIT- LEFT 1ST TOE;  Surgeon: Mal Misty, MD;  Location: [place];  Service: Vascular;  Laterality: Left;  . AMPUTATION Left 04/26/2013   Procedure: Left Foot Transmetatarsal Amputation;  Surgeon: Newt Minion, MD;  Location: [place];  Service: Orthopedics;  Laterality: Left;  . AMPUTATION Left 05/14/2013   Procedure: AMPUTATION BELOW KNEE;  Surgeon: Newt Minion, MD;  Location: [place];  Service: Orthopedics;  Laterality: Left;  Left Below Knee Amputation  . AMPUTATION Left 07/23/2013   Procedure: AMPUTATION BELOW KNEE;  Surgeon: Newt Minion, MD;  Location: [place];  Service: Orthopedics;  Laterality: Left;  Left Below Knee Amputation Revision  . AMPUTATION Right 02/18/2014   Procedure: AMPUTATION BELOW KNEE;  Surgeon: Newt Minion, MD;  Location: [place];  Service: Orthopedics;  Laterality: Right;  . AMPUTATION Right 08/20/2016   Procedure: RIGHT LONG FINGER AMPUTATION;  Surgeon: Leanora Cover, MD;  Location: [place];  Service: Orthopedics;  Laterality:  Right;  . AV FISTULA PLACEMENT Left 03/25/2012   Procedure: ARTERIOVENOUS (AV) FISTULA CREATION;  Surgeon: Mal Misty, MD;  Location: [place];  Service: Vascular;  Laterality: Left;  . BELOW KNEE LEG AMPUTATION Left 05/14/2013   DR [name]  . BELOW KNEE LEG AMPUTATION Right 02/18/2014   dr duda  . CARDIAC CATHETERIZATION     approx 30 years ago  . CERVICAL DISC SURGERY    . EYE SURGERY Bilateral    CATARACTS  . I&D EXTREMITY Left 04/22/2013   Procedure: IRRIGATION AND DEBRIDEMENT LEFT FIRST TOE AMPUTATION WOUND ;  Surgeon: Mal Misty, MD;  Location: [place];  Service: Vascular;  Laterality: Left;  . I&D EXTREMITY Right 01/05/2014    Procedure: IRRIGATION AND DEBRIDEMENT Right Heel Ulcer;  Surgeon: Mcarthur Rossetti, MD;  Location: [place];  Service: Orthopedics;  Laterality: Right;  Surgeon Available after 5PM  . INSERTION OF DIALYSIS CATHETER Right 03/19/2012   Procedure: INSERTION OF DIALYSIS CATHETER;  Surgeon: Mal Misty, MD;  Location: [place];  Service: Vascular;  Laterality: Right;  Right Internal Jugular  . LIGATION OF COMPETING BRANCHES OF ARTERIOVENOUS FISTULA Left 05/08/2012   Procedure: LIGATION OF COMPETING BRANCHES OF ARTERIOVENOUS FISTULA;  Surgeon: Mal Misty, MD;  Location: [place];  Service: Vascular;  Laterality: Left;  Ultrasound guided  . REPAIR QUADRICEPS/HAMSTRING MUSCLES Left 05/20/2014   Procedure: Left Hamstring Release;  Surgeon: Newt Minion, MD;  Location: [place];  Service: Orthopedics;  Laterality: Left;  . REVISON OF ARTERIOVENOUS FISTULA Left 01/30/2016   Procedure: CEPHALIC VEIN TURNDOWN TO LEFT UPPER ARM;  Surgeon: Elam Dutch, MD;  Location: [place];  Service: Vascular;  Laterality: Left;  . STUMP REVISION Right 05/20/2014   Procedure: Revision Right Below Knee Amputation;  Surgeon: Newt Minion, MD;  Location: [place];  Service: Orthopedics;  Laterality: Right;  . TEE WITHOUT CARDIOVERSION N/A 04/20/2013   Procedure: TRANSESOPHAGEAL ECHOCARDIOGRAM (TEE);  Surgeon: Josue Hector, MD;  Location: [facility] ENDOSCOPY;  Service: Cardiovascular;  Laterality: N/A;  . THYROIDECTOMY    . TOE AMPUTATION     D/C 04-30-13  . TRANSMETATARSAL AMPUTATION Left 12/16/2012   Procedure: TRANSMETATARSAL AMPUTATION AND VAC PLACEMENT;  Surgeon: Elam Dutch, MD;  Location: [place];  Service: Vascular;  Laterality: Left;    There were no vitals filed for this visit.      Subjective Assessment - 11/12/16 1253    Subjective  Pt reports finger pain today   Pertinent History recent right middle finger tip amputation through the middle phalanx, bilateral transtibial amputations, anemia, asthma, contracture of L knee,  decubitus ulcer of sacral region, DM with PVD, ESRD, glaucoma, heart murmur, HTN, hypothyroidism, multiple myeloma, PAD   Currently in Pain? Yes   Pain Score 5    Pain Location Hand   Pain Type Acute pain   Pain Onset More than a month ago   Pain Frequency Intermittent   Aggravating Factors  bending   Pain Relieving Factors soaking   Multiple Pain Sites No            Treatment: middle finger was dressed in stockinette and was noted to have a significant amount of red bloody drainage. Pt was unwrapped, no/ signs and symptoms of infection, wound demonstrates continued healing. Hand was washed with soap and water avoiding wound. Finger was dressed with stockinette folded over fingertip, then glove applied to protect hand against infection while performing activities in therapy. A/ROM, MP, PIP and  composite flexion x 15 reps min v.c Pt was instructed in updated HEP. See pt instructions. Placing medium pegs into pegboard with RUE, min difficulty.                   OT Short Term Goals - 11/05/16 0911      OT SHORT TERM GOAL #1   Title I with inital HEP   Status Achieved     OT SHORT TERM GOAL #2   Title Pt / caregiver will verbalize understanding of daily dressing changes, wound care and s/s of infection   Status Achieved     OT SHORT TERM GOAL #3   Title Pt/ caregiver will verbalize understanding of AE/ DME in order to maximize pt safety and indepenence with ADLs/ IADLs.   Status On-going     OT SHORT TERM GOAL #4   Title Pt will resume performance of all basic ADLs at a modified independent level.   Status On-going  90%     OT SHORT TERM GOAL #5   Title Pt will demonstrate adequate RUE strength in order to donn prosthesis/ liners independently.   Status On-going           OT Long Term Goals - 10/01/16 1057      OT LONG TERM GOAL #1   Title I with updated HEP   Time 8   Period Weeks   Status New   Target Date 11/30/16     OT LONG TERM GOAL #2    Title Pt will report using RUE consistently for ADLS at least 02%X for ADLs/ IADLs with pain less than or equal to 2/10.   Time 8   Period Weeks   Status New     OT LONG TERM GOAL #3   Title Pt will demonstrate RUE grip strength of at least 30 lbs for increased Functional use during ADLs/ IADLs.   Time 8   Period Weeks   Status New     OT LONG TERM GOAL #4   Title Pt will resume performance of light home management/ cooking at a modified independent level   Time 8   Period Weeks   Status New               Plan - 11/12/16 1256    Clinical Impression Statement Pt is progressing towards goals for A/ROM and RUE functional use.   Rehab Potential Good   Current Impairments/barriers affecting progress: pain   OT Frequency 2x / week   OT Duration 8 weeks   OT Treatment/Interventions Self-care/ADL training;Moist Heat;Fluidtherapy;DME and/or AE instruction;Splinting;Patient/family education;Therapeutic exercises;Ultrasound;Therapeutic exercise;Therapeutic activities;Cognitive remediation/compensation;Passive range of motion;Functional Mobility Training;Neuromuscular education;Iontophoresis;Cryotherapy;Electrical Stimulation;Energy conservation;Manual Therapy   Plan continue ROM and light functional use   Consulted and Agree with Plan of Care Patient;Family member/caregiver      Patient will benefit from skilled therapeutic intervention in order to improve the following deficits and impairments:  Decreased range of motion, Decreased coordination, Pain, Impaired UE functional use, Decreased knowledge of use of DME, Decreased balance, Decreased mobility, Decreased strength  Visit Diagnosis: Muscle weakness (generalized)  Other abnormalities of gait and mobility  Unsteadiness on feet  Stiffness of joint, hand, right  Other lack of coordination    Problem List Patient Active Problem List   Diagnosis Date Noted  . Dyslipidemia 11/21/2015  . Diabetic retinopathy ([place])- left  eye 10/02/2015  . Complications, amputation stump late ([place]) 05/20/2014  . Weakness generalized 04/07/2014  . Sepsis ([place]) 04/07/2014  .  Decubitus ulcer, stage II 04/07/2014  . Decubitus ulcer of ankle   . Fatigue   . Wound infection 04/06/2014  . Decubitus ulcer of sacral region, stage 2 03/07/2014  . Glaucoma 03/07/2014  . Gout 03/07/2014  . Below knee amputation status ([place]) 02/18/2014  . Osteomyelitis ([place]) 01/04/2014  . Phantom limb ([place]) 12/14/2013  . Type 2 diabetes mellitus with diabetic foot infection ([place])   . Diabetes mellitus with peripheral vascular disease ([place])   . Asthma 05/17/2013  . S/P bilateral below knee amputation ([place]) 05/17/2013  . History of MRSA infection 04/22/2013  . Peripheral vascular disease ([place]) 11/19/2012  . End-stage renal disease on hemodialysis ([place]) 05/05/2012  . Kahler disease ([place]) 03/30/2012  . Anemia in chronic kidney disease 03/19/2012  . Hypothyroidism following radioiodine therapy 03/19/2012  . Anemia, iron deficiency 03/19/2012  . Hypertension 03/18/2012  . Chronic kidney disease (CKD), stage V ([place]) 03/18/2012  . Cardiac conduction disorder 03/18/2012  . MGUS (monoclonal gammopathy of unknown significance) 02/28/2011  . Monoclonal paraproteinemia 02/28/2011    RINE,KATHRYN 11/12/2016, 12:58 PM  [facility] [address] [place], [place], [address] Phone: [phone]   Fax:  [phone]  Name: Okechukwu Regnier MRN: [id number] Date of Birth: 09/22/1945

## 2016-11-12 NOTE — Therapy (Signed)
[facility] [address] [place], [place], [address] Phone: [phone]   Fax:  [phone]  Physical Therapy Treatment  Patient Details  Name: Jack [name] MRN: [id number] Date of Birth: 10-19-1945 Referring Provider: Meridee Score MD   Encounter Date: 11/12/2016      PT End of Session - 11/12/16 0926    Visit Number 22   Number of Visits 24   Date for PT Re-Evaluation 11/23/15   Authorization Type Medicare & G-codes every 10th visit    PT Start Time 0801   PT Stop Time 0840   PT Time Calculation (min) 39 min   Equipment Utilized During Treatment Gait belt   Activity Tolerance Patient tolerated treatment well;Patient limited by fatigue   Behavior During Therapy [facility] for tasks assessed/performed      Past Medical History:  Diagnosis Date  . Anemia   . Asthma   . Contracture of joint    left knee  . Decubitus ulcer of sacral region, stage 2 03/07/2014  . Diabetes mellitus with peripheral vascular disease ([place])   . End-stage renal disease on hemodialysis [facility])    Started HD March 2014.  Cause of ESRD was DM.  Gets HD at [facility] on [place] on MWF schedule.   Marland Kitchen ESRD on hemodialysis ([place])   . Gangrene ([place])    right BKA  . Gangrene of foot ([place])   . Glaucoma   . Glaucoma 03/07/2014  . Heart murmur   . History of MRSA infection 04/22/2013   Bacteremia assoc w L foot wound infection Mar 2015   . Hyperparathyroidism, secondary renal ([place])   . Hypertension   . Hypothyroidism   . MRSA bacteremia   . Multiple myeloma, without mention of having achieved remission 03/30/2012   Cytogenetic neg on 03/23/2012.  Marland Kitchen Peripheral arterial disease ([place])   . Peripheral vascular disease, unspecified ([place]) 11/19/2012   In the past had R foot toe amps then R TMA. In 2015 had left foot toe amp > then TMA >then L BKA on 05/14/13   . Pneumonia    2012  . Thyroid disease    hyperparathyroidism    Past Surgical History:   Procedure Laterality Date  . ABDOMINAL AORTAGRAM Bilateral 11/06/2012   Procedure: ABDOMINAL AORTAGRAM;  Surgeon: Elam Dutch, MD;  Location: [facility] CATH LAB;  Service: Cardiovascular;  Laterality: Bilateral;  . AMPUTATION Right 11/10/2012   Procedure: AMPUTATION FIRST and SECOND TOES Right Foot;  Surgeon: Elam Dutch, MD;  Location: [place];  Service: Vascular;  Laterality: Right;  . AMPUTATION Left 04/07/2013   Procedure: AMPUTATION DIGIT- LEFT 1ST TOE;  Surgeon: Mal Misty, MD;  Location: [place];  Service: Vascular;  Laterality: Left;  . AMPUTATION Left 04/26/2013   Procedure: Left Foot Transmetatarsal Amputation;  Surgeon: Newt Minion, MD;  Location: [place];  Service: Orthopedics;  Laterality: Left;  . AMPUTATION Left 05/14/2013   Procedure: AMPUTATION BELOW KNEE;  Surgeon: Newt Minion, MD;  Location: [place];  Service: Orthopedics;  Laterality: Left;  Left Below Knee Amputation  . AMPUTATION Left 07/23/2013   Procedure: AMPUTATION BELOW KNEE;  Surgeon: Newt Minion, MD;  Location: [place];  Service: Orthopedics;  Laterality: Left;  Left Below Knee Amputation Revision  . AMPUTATION Right 02/18/2014   Procedure: AMPUTATION BELOW KNEE;  Surgeon: Newt Minion, MD;  Location: [place];  Service: Orthopedics;  Laterality: Right;  . AMPUTATION Right 08/20/2016   Procedure: RIGHT  LONG FINGER AMPUTATION;  Surgeon: Leanora Cover, MD;  Location: [place];  Service: Orthopedics;  Laterality: Right;  . AV FISTULA PLACEMENT Left 03/25/2012   Procedure: ARTERIOVENOUS (AV) FISTULA CREATION;  Surgeon: Mal Misty, MD;  Location: [place];  Service: Vascular;  Laterality: Left;  . BELOW KNEE LEG AMPUTATION Left 05/14/2013   DR [name]  . BELOW KNEE LEG AMPUTATION Right 02/18/2014   dr duda  . CARDIAC CATHETERIZATION     approx 30 years ago  . CERVICAL DISC SURGERY    . EYE SURGERY Bilateral    CATARACTS  . I&D EXTREMITY Left 04/22/2013   Procedure: IRRIGATION AND DEBRIDEMENT LEFT FIRST TOE  AMPUTATION WOUND ;  Surgeon: Mal Misty, MD;  Location: [place];  Service: Vascular;  Laterality: Left;  . I&D EXTREMITY Right 01/05/2014   Procedure: IRRIGATION AND DEBRIDEMENT Right Heel Ulcer;  Surgeon: Mcarthur Rossetti, MD;  Location: [place];  Service: Orthopedics;  Laterality: Right;  Surgeon Available after 5PM  . INSERTION OF DIALYSIS CATHETER Right 03/19/2012   Procedure: INSERTION OF DIALYSIS CATHETER;  Surgeon: Mal Misty, MD;  Location: [place];  Service: Vascular;  Laterality: Right;  Right Internal Jugular  . LIGATION OF COMPETING BRANCHES OF ARTERIOVENOUS FISTULA Left 05/08/2012   Procedure: LIGATION OF COMPETING BRANCHES OF ARTERIOVENOUS FISTULA;  Surgeon: Mal Misty, MD;  Location: [place];  Service: Vascular;  Laterality: Left;  Ultrasound guided  . REPAIR QUADRICEPS/HAMSTRING MUSCLES Left 05/20/2014   Procedure: Left Hamstring Release;  Surgeon: Newt Minion, MD;  Location: [place];  Service: Orthopedics;  Laterality: Left;  . REVISON OF ARTERIOVENOUS FISTULA Left 01/30/2016   Procedure: CEPHALIC VEIN TURNDOWN TO LEFT UPPER ARM;  Surgeon: Elam Dutch, MD;  Location: [place];  Service: Vascular;  Laterality: Left;  . STUMP REVISION Right 05/20/2014   Procedure: Revision Right Below Knee Amputation;  Surgeon: Newt Minion, MD;  Location: [place];  Service: Orthopedics;  Laterality: Right;  . TEE WITHOUT CARDIOVERSION N/A 04/20/2013   Procedure: TRANSESOPHAGEAL ECHOCARDIOGRAM (TEE);  Surgeon: Josue Hector, MD;  Location: [facility] ENDOSCOPY;  Service: Cardiovascular;  Laterality: N/A;  . THYROIDECTOMY    . TOE AMPUTATION     D/C 04-30-13  . TRANSMETATARSAL AMPUTATION Left 12/16/2012   Procedure: TRANSMETATARSAL AMPUTATION AND VAC PLACEMENT;  Surgeon: Elam Dutch, MD;  Location: [place];  Service: Vascular;  Laterality: Left;    There were no vitals filed for this visit.      Subjective Assessment - 11/12/16 0802    Subjective He has been walking indoors with RW & outdoors with  rollator with family.    Patient is accompained by: Family member   Pertinent History bilateral transtibial amputations, anemia, asthma, contracture of L knee, decubitus ulcer of sacral region, DM with PVD, ESRD, glaucoma, heart murmur, HTN, hypothyroidism, multiple myeloma, PAD   Limitations Lifting;Standing;Walking;House hold activities   Patient Stated Goals walk without a person assisting him (walk with device only)    Currently in Pain? Yes   Pain Score 5    Pain Location Hand   Pain Orientation Right   Pain Descriptors / Indicators Other (Comment);Burning  stinging   Pain Type Acute pain   Pain Onset More than a month ago   Pain Frequency Intermittent   Aggravating Factors  bending   Pain Relieving Factors soaking in warm water   Effect of Pain on Daily Activities difficulty using right hand  [place] Adult PT Treatment/Exercise - 11/12/16 0800      Transfers   Transfers Sit to Stand;Stand to Sit   Sit to Stand 5: Supervision;With upper extremity assist;With armrests;From chair/3-in-1  to rollator walker   Sit to Stand Details Verbal cues for sequencing;Verbal cues for technique;Verbal cues for safe use of DME/AE   Stand to Sit 5: Supervision;With upper extremity assist;With armrests;To chair/3-in-1  from rollator walker   Stand to Sit Details (indicate cue type and reason) Verbal cues for safe use of DME/AE;Verbal cues for sequencing;Verbal cues for technique   Stand Pivot Transfers 4: Min assist;With armrests  turning 180* to sit/stand rollator seat   Stand Pivot Transfer Details (indicate cue type and reason) manual assist to stabilize rollator & verbal cues on UE movement     Ambulation/Gait   Ambulation/Gait Yes   Ambulation/Gait Assistance 5: Supervision   Ambulation/Gait Assistance Details verbal cues on posture and placement of rollator for safety   Ambulation Distance (Feet) 150 Feet  150', 25' X 2, 110'   Assistive device  Rollator;Prostheses   Gait Pattern Step-through pattern;Decreased hip/knee flexion - right;Decreased hip/knee flexion - left;Trunk flexed;Poor foot clearance - right;Wide base of support   Ambulation Surface Indoor;Level   Ramp 4: Min assist  min guard with rollator & prostheses   Ramp Details (indicate cue type and reason) verbal cues on technique & posture   Curb 4: Min assist  rollator & prostheses   Curb Details (indicate cue type and reason) verbal cues on technique     Balance   Balance Assessed No     High Level Balance   High Level Balance Activities --   High Level Balance Comments --     Exercises   Exercises --   Other Exercises  --     Knee/Hip Exercises: Seated   Marching --   Marching Limitations --   Hamstring Curl --   Hamstring Limitations --   Abduction/Adduction  --   Abd/Adduction Limitations --                  PT Short Term Goals - 11/05/16 1044      PT SHORT TERM GOAL #1   Title Patient will verbalize understanding and return demonstration of updated HEP. (All STGs Target Date 10/24/16)   Baseline MET 10/24/16   Time 1   Period Months   Status Achieved     PT SHORT TERM GOAL #2   Title Patient will demonstrate ability to reach 5 inches anteriorly and reach within 10 inches from the floor unilateral UE support on RW with supervision to indicate a decrease in his risk of falling.     Baseline MET 10/24/16   Time 1   Period Months   Status Achieved     PT SHORT TERM GOAL #3   Title Patient sit to/from stand and ambulates 50' around furniture with std RW & Bil. prostheses around furniture with supervision.    Baseline MET 10/24/16   Time 1   Period Months   Status Achieved     PT SHORT TERM GOAL #4   Title Patient ambulates 200 feet on indoor surfaces with std rolling walker and supervision    Baseline 10/24/16 progressing but distance not met: Pt ambulated 155' with rollator walker with supervision.    Status Not Met     PT  SHORT TERM GOAL #5   Title Patient negotiates ramp & curb with std rolling walker & prostheses with minimal  guard.    Baseline MET 10/24/16   Status Achieved           PT Long Term Goals - 11/05/16 1044      PT LONG TERM GOAL #1   Title Patient will verbalize understanding and return demonstration of ongoing HEP program. (All LTGs TARGET DATE: 11/22/2016)    Baseline 09/24/16: can verbalize HEP, has not been consistent with performance   Time 8   Period Weeks   Status On-going     PT LONG TERM GOAL #2   Title Patient will ambulate 200 feet on firm, paved outdoor surfaces with family support to indicate a decrease risk of falling when returning to community ambulation with family support.     Baseline 09/24/16: pt fatigued after 115 feet on indoor surfaces, did not test outdoors   Time 8   Period Weeks   Status On-going     PT LONG TERM GOAL #3   Title Patient ambulates 24' with standard RW and prostheses around furniture modified independent.     Baseline 09/24/16: unable to test due to time constraints and pt fatigue   Time 8   Period Weeks   Status On-going     PT LONG TERM GOAL #4   Title Patient will be able to ambulate over ramp/curb with rollator style walker with family assistance to indicate a decrease in his risk of falling when returning to community ambulation with family.     Baseline 09/24/16: pt needed up to mod assist for both today with RW   Time 8   Period Weeks   Status On-going     PT LONG TERM GOAL #5   Title Patient will demonstrate ability to reach 8 inches anteriorly and reach within 8 inches from the floor unilateral UE support on RW with supervision to indicate a decrease in his risk of falling.     Time 8   Period Weeks   Status On-going               Plan - 11/12/16 0086    Clinical Impression Statement Patient is on target to meet LTGs by end of next week per plan of care with anticipated discharge. Patient reports starting to initiate  community mobility with family by ambulating in/out of his apartment.    Rehab Potential Good   Clinical Impairments Affecting Rehab Potential bilateral transtibial amputations, anemia, asthma, contracture of L knee, decubitus ulcer of sacral region, DM with PVD, ESRD, glaucoma, heart murmur, HTN, hypothyroidism, multiple myeloma, PAD   PT Frequency 2x / week   PT Duration Other (comment)  9 weeks (60 days)   PT Treatment/Interventions ADLs/Self Care Home Management;Neuromuscular re-education;Balance training;Therapeutic exercise;Therapeutic activities;Functional mobility training;Stair training;Gait training;DME Instruction;Patient/family education;Prosthetic Training;Manual techniques;Scar mobilization;Passive range of motion   PT Next Visit Plan  Prosthetic gait & balance with rollator for community & RW for household activities, work towards [place] with anticipated discharge 11/9   Consulted and Agree with Plan of Care Patient;Family member/caregiver   Family Member Consulted dtr-Cory      Patient will benefit from skilled therapeutic intervention in order to improve the following deficits and impairments:  Abnormal gait, Decreased activity tolerance, Decreased balance, Decreased coordination, Decreased range of motion, Decreased safety awareness, Decreased knowledge of use of DME, Decreased knowledge of precautions, Decreased endurance, Decreased strength, Difficulty walking, Prosthetic Dependency, Postural dysfunction  Visit Diagnosis: Muscle weakness (generalized)  Other abnormalities of gait and mobility  Unsteadiness on feet     Problem  List Patient Active Problem List   Diagnosis Date Noted  . Dyslipidemia 11/21/2015  . Diabetic retinopathy ([place])- left eye 10/02/2015  . Complications, amputation stump late ([place]) 05/20/2014  . Weakness generalized 04/07/2014  . Sepsis ([place]) 04/07/2014  . Decubitus ulcer, stage II 04/07/2014  . Decubitus ulcer of ankle   . Fatigue   . Wound  infection 04/06/2014  . Decubitus ulcer of sacral region, stage 2 03/07/2014  . Glaucoma 03/07/2014  . Gout 03/07/2014  . Below knee amputation status ([place]) 02/18/2014  . Osteomyelitis ([place]) 01/04/2014  . Phantom limb ([place]) 12/14/2013  . Type 2 diabetes mellitus with diabetic foot infection ([place])   . Diabetes mellitus with peripheral vascular disease ([place])   . Asthma 05/17/2013  . S/P bilateral below knee amputation ([place]) 05/17/2013  . History of MRSA infection 04/22/2013  . Peripheral vascular disease ([place]) 11/19/2012  . End-stage renal disease on hemodialysis ([place]) 05/05/2012  . Kahler disease ([place]) 03/30/2012  . Anemia in chronic kidney disease 03/19/2012  . Hypothyroidism following radioiodine therapy 03/19/2012  . Anemia, iron deficiency 03/19/2012  . Hypertension 03/18/2012  . Chronic kidney disease (CKD), stage V ([place]) 03/18/2012  . Cardiac conduction disorder 03/18/2012  . MGUS (monoclonal gammopathy of unknown significance) 02/28/2011  . Monoclonal paraproteinemia 02/28/2011    Jamey Reas PT, DPT 11/12/2016, 9:30 AM  [facility] [address] [place], [place], [address] Phone: [phone]   Fax:  [phone]  Name: Jack [name] MRN: [id number] Date of Birth: [birth year]

## 2016-11-13 DIAGNOSIS — D631 Anemia in chronic kidney disease: Secondary | ICD-10-CM | POA: Diagnosis not present

## 2016-11-13 DIAGNOSIS — N2581 Secondary hyperparathyroidism of renal origin: Secondary | ICD-10-CM | POA: Diagnosis not present

## 2016-11-13 DIAGNOSIS — D689 Coagulation defect, unspecified: Secondary | ICD-10-CM | POA: Diagnosis not present

## 2016-11-13 DIAGNOSIS — E1029 Type 1 diabetes mellitus with other diabetic kidney complication: Secondary | ICD-10-CM | POA: Diagnosis not present

## 2016-11-13 DIAGNOSIS — N186 End stage renal disease: Secondary | ICD-10-CM | POA: Diagnosis not present

## 2016-11-13 DIAGNOSIS — Z992 Dependence on renal dialysis: Secondary | ICD-10-CM | POA: Diagnosis not present

## 2016-11-13 DIAGNOSIS — C9000 Multiple myeloma not having achieved remission: Secondary | ICD-10-CM | POA: Diagnosis not present

## 2016-11-13 DIAGNOSIS — T82598A Other mechanical complication of other cardiac and vascular devices and implants, initial encounter: Secondary | ICD-10-CM | POA: Diagnosis not present

## 2016-11-14 ENCOUNTER — Encounter: Payer: Self-pay | Admitting: Physical Therapy

## 2016-11-14 ENCOUNTER — Ambulatory Visit: Payer: Medicare Other | Attending: Orthopedic Surgery | Admitting: Physical Therapy

## 2016-11-14 ENCOUNTER — Ambulatory Visit: Payer: Medicare Other | Admitting: Occupational Therapy

## 2016-11-14 DIAGNOSIS — M6281 Muscle weakness (generalized): Secondary | ICD-10-CM | POA: Diagnosis not present

## 2016-11-14 DIAGNOSIS — R278 Other lack of coordination: Secondary | ICD-10-CM | POA: Diagnosis not present

## 2016-11-14 DIAGNOSIS — M25641 Stiffness of right hand, not elsewhere classified: Secondary | ICD-10-CM | POA: Diagnosis not present

## 2016-11-14 DIAGNOSIS — R2689 Other abnormalities of gait and mobility: Secondary | ICD-10-CM | POA: Diagnosis not present

## 2016-11-14 DIAGNOSIS — R2681 Unsteadiness on feet: Secondary | ICD-10-CM | POA: Diagnosis not present

## 2016-11-14 NOTE — Therapy (Signed)
[facility] [address] [place], [place], [address] Phone: [phone]   Fax:  [phone]  Occupational Therapy Treatment  Patient Details  Name: Jack [name] MRN: [id number] Date of Birth: 07/17/1945 Referring Provider: Dr. Leanora Cover  Encounter Date: 11/14/2016      OT End of Session - 11/14/16 1257    Visit Number 9   Number of Visits 17   Date for OT Re-Evaluation 11/30/16   Authorization Type U[employer] MCR   Authorization - Visit Number 9   Authorization - Number of Visits 10   OT Start Time 0805   OT Stop Time 0845   OT Time Calculation (min) 40 min      Past Medical History:  Diagnosis Date  . Anemia   . Asthma   . Contracture of joint    left knee  . Decubitus ulcer of sacral region, stage 2 03/07/2014  . Diabetes mellitus with peripheral vascular disease ([place])   . End-stage renal disease on hemodialysis [facility])    Started HD March 2014.  Cause of ESRD was DM.  Gets HD at [facility] on [place] on MWF schedule.   Marland Kitchen ESRD on hemodialysis ([place])   . Gangrene ([place])    right BKA  . Gangrene of foot ([place])   . Glaucoma   . Glaucoma 03/07/2014  . Heart murmur   . History of MRSA infection 04/22/2013   Bacteremia assoc w L foot wound infection Mar 2015   . Hyperparathyroidism, secondary renal ([place])   . Hypertension   . Hypothyroidism   . MRSA bacteremia   . Multiple myeloma, without mention of having achieved remission 03/30/2012   Cytogenetic neg on 03/23/2012.  Marland Kitchen Peripheral arterial disease ([place])   . Peripheral vascular disease, unspecified ([place]) 11/19/2012   In the past had R foot toe amps then R TMA. In 2015 had left foot toe amp > then TMA >then L BKA on 05/14/13   . Pneumonia    2012  . Thyroid disease    hyperparathyroidism    Past Surgical History:  Procedure Laterality Date  . ABDOMINAL AORTAGRAM Bilateral 11/06/2012   Procedure: ABDOMINAL AORTAGRAM;  Surgeon: Elam Dutch, MD;   Location: [facility] CATH LAB;  Service: Cardiovascular;  Laterality: Bilateral;  . AMPUTATION Right 11/10/2012   Procedure: AMPUTATION FIRST and SECOND TOES Right Foot;  Surgeon: Elam Dutch, MD;  Location: [facility];  Service: Vascular;  Laterality: Right;  . AMPUTATION Left 04/07/2013   Procedure: AMPUTATION DIGIT- LEFT 1ST TOE;  Surgeon: Mal Misty, MD;  Location: [place];  Service: Vascular;  Laterality: Left;  . AMPUTATION Left 04/26/2013   Procedure: Left Foot Transmetatarsal Amputation;  Surgeon: Newt Minion, MD;  Location: [place];  Service: Orthopedics;  Laterality: Left;  . AMPUTATION Left 05/14/2013   Procedure: AMPUTATION BELOW KNEE;  Surgeon: Newt Minion, MD;  Location: [place];  Service: Orthopedics;  Laterality: Left;  Left Below Knee Amputation  . AMPUTATION Left 07/23/2013   Procedure: AMPUTATION BELOW KNEE;  Surgeon: Newt Minion, MD;  Location: [place];  Service: Orthopedics;  Laterality: Left;  Left Below Knee Amputation Revision  . AMPUTATION Right 02/18/2014   Procedure: AMPUTATION BELOW KNEE;  Surgeon: Newt Minion, MD;  Location: [place];  Service: Orthopedics;  Laterality: Right;  . AMPUTATION Right 08/20/2016   Procedure: RIGHT LONG FINGER AMPUTATION;  Surgeon: Leanora Cover, MD;  Location: [place];  Service: Orthopedics;  Laterality:  Right;  . AV FISTULA PLACEMENT Left 03/25/2012   Procedure: ARTERIOVENOUS (AV) FISTULA CREATION;  Surgeon: Mal Misty, MD;  Location: [place];  Service: Vascular;  Laterality: Left;  . BELOW KNEE LEG AMPUTATION Left 05/14/2013   DR [name]  . BELOW KNEE LEG AMPUTATION Right 02/18/2014   dr duda  . CARDIAC CATHETERIZATION     approx 30 years ago  . CERVICAL DISC SURGERY    . EYE SURGERY Bilateral    CATARACTS  . I&D EXTREMITY Left 04/22/2013   Procedure: IRRIGATION AND DEBRIDEMENT LEFT FIRST TOE AMPUTATION WOUND ;  Surgeon: Mal Misty, MD;  Location: [place];  Service: Vascular;  Laterality: Left;  . I&D EXTREMITY Right 01/05/2014    Procedure: IRRIGATION AND DEBRIDEMENT Right Heel Ulcer;  Surgeon: Mcarthur Rossetti, MD;  Location: [place];  Service: Orthopedics;  Laterality: Right;  Surgeon Available after 5PM  . INSERTION OF DIALYSIS CATHETER Right 03/19/2012   Procedure: INSERTION OF DIALYSIS CATHETER;  Surgeon: Mal Misty, MD;  Location: [place];  Service: Vascular;  Laterality: Right;  Right Internal Jugular  . LIGATION OF COMPETING BRANCHES OF ARTERIOVENOUS FISTULA Left 05/08/2012   Procedure: LIGATION OF COMPETING BRANCHES OF ARTERIOVENOUS FISTULA;  Surgeon: Mal Misty, MD;  Location: [place];  Service: Vascular;  Laterality: Left;  Ultrasound guided  . REPAIR QUADRICEPS/HAMSTRING MUSCLES Left 05/20/2014   Procedure: Left Hamstring Release;  Surgeon: Newt Minion, MD;  Location: [facility];  Service: Orthopedics;  Laterality: Left;  . REVISON OF ARTERIOVENOUS FISTULA Left 01/30/2016   Procedure: CEPHALIC VEIN TURNDOWN TO LEFT UPPER ARM;  Surgeon: Elam Dutch, MD;  Location: [place];  Service: Vascular;  Laterality: Left;  . STUMP REVISION Right 05/20/2014   Procedure: Revision Right Below Knee Amputation;  Surgeon: Newt Minion, MD;  Location: [place];  Service: Orthopedics;  Laterality: Right;  . TEE WITHOUT CARDIOVERSION N/A 04/20/2013   Procedure: TRANSESOPHAGEAL ECHOCARDIOGRAM (TEE);  Surgeon: Josue Hector, MD;  Location: [facility] ENDOSCOPY;  Service: Cardiovascular;  Laterality: N/A;  . THYROIDECTOMY    . TOE AMPUTATION     D/C 04-30-13  . TRANSMETATARSAL AMPUTATION Left 12/16/2012   Procedure: TRANSMETATARSAL AMPUTATION AND VAC PLACEMENT;  Surgeon: Elam Dutch, MD;  Location: [place];  Service: Vascular;  Laterality: Left;    There were no vitals filed for this visit.      Subjective Assessment - 11/14/16 0831    Pertinent History recent right middle finger tip amputation through the middle phalanx, bilateral transtibial amputations, anemia, asthma, contracture of L knee, decubitus ulcer of sacral region, DM with  PVD, ESRD, glaucoma, heart murmur, HTN, hypothyroidism, multiple myeloma, PAD   Patient Stated Goals improve ROM and function in right hand   Currently in Pain? No/denies            Treatment:Pt arrived withmiddle finger was dressed in stockinette and was minimal amount of bloody drainage. Pt was unwrapped, no/ signs and symptoms of infection, wound demonstrates continued healing. Hand was washed with soap and water avoiding wound. Finger was dressed with stockinette folded over fingertip, then glove applied to protect hand against infection while performing activities in therapy.  (Glove placed over middle finger to protect against infection during therapy tasks) A/ROM, MP, PIP and composite flexion x 15 reps min v.c Education with pt/daughter regarding AE recommendations.Long handled sponge, button hook, use of walker tray or rollator to carry items.. Pt practiced with buttonhook, and demonstrates increased ease with fastening buttons  with use.                    OT Short Term Goals - 11/14/16 1258      OT SHORT TERM GOAL #1   Title I with inital HEP   Status Achieved     OT SHORT TERM GOAL #2   Title Pt / caregiver will verbalize understanding of daily dressing changes, wound care and s/s of infection   Status Achieved     OT SHORT TERM GOAL #3   Title Pt/ caregiver will verbalize understanding of AE/ DME in order to maximize pt safety and indepenence with ADLs/ IADLs.   Status Achieved     OT SHORT TERM GOAL #4   Title Pt will resume performance of all basic ADLs at a modified independent level.   Status On-going  90%     OT SHORT TERM GOAL #5   Title Pt will demonstrate adequate RUE strength in order to donn prosthesis/ liners independently.   Status On-going           OT Long Term Goals - 10/01/16 1057      OT LONG TERM GOAL #1   Title I with updated HEP   Time 8   Period Weeks   Status New   Target Date 11/30/16     OT LONG TERM GOAL #2    Title Pt will report using RUE consistently for ADLS at least 14%E for ADLs/ IADLs with pain less than or equal to 2/10.   Time 8   Period Weeks   Status New     OT LONG TERM GOAL #3   Title Pt will demonstrate RUE grip strength of at least 30 lbs for increased Functional use during ADLs/ IADLs.   Time 8   Period Weeks   Status New     OT LONG TERM GOAL #4   Title Pt will resume performance of light home management/ cooking at a modified independent level   Time 8   Period Weeks   Status New             Patient will benefit from skilled therapeutic intervention in order to improve the following deficits and impairments:     Visit Diagnosis: Muscle weakness (generalized)  Other abnormalities of gait and mobility  Stiffness of joint, hand, right  Other lack of coordination    Problem List Patient Active Problem List   Diagnosis Date Noted  . Dyslipidemia 11/21/2015  . Diabetic retinopathy ([place])- left eye 10/02/2015  . Complications, amputation stump late ([place]) 05/20/2014  . Weakness generalized 04/07/2014  . Sepsis ([place]) 04/07/2014  . Decubitus ulcer, stage II 04/07/2014  . Decubitus ulcer of ankle   . Fatigue   . Wound infection 04/06/2014  . Decubitus ulcer of sacral region, stage 2 03/07/2014  . Glaucoma 03/07/2014  . Gout 03/07/2014  . Below knee amputation status ([place]) 02/18/2014  . Osteomyelitis ([place]) 01/04/2014  . Phantom limb ([place]) 12/14/2013  . Type 2 diabetes mellitus with diabetic foot infection ([place])   . Diabetes mellitus with peripheral vascular disease ([place])   . Asthma 05/17/2013  . S/P bilateral below knee amputation ([place]) 05/17/2013  . History of MRSA infection 04/22/2013  . Peripheral vascular disease ([place]) 11/19/2012  . End-stage renal disease on hemodialysis ([place]) 05/05/2012  . Kahler disease ([place]) 03/30/2012  . Anemia in chronic kidney disease 03/19/2012  . Hypothyroidism following radioiodine therapy 03/19/2012  . Anemia, iron  deficiency 03/19/2012  .  Hypertension 03/18/2012  . Chronic kidney disease (CKD), stage V ([place]) 03/18/2012  . Cardiac conduction disorder 03/18/2012  . MGUS (monoclonal gammopathy of unknown significance) 02/28/2011  . Monoclonal paraproteinemia 02/28/2011    Jack [name] 11/14/2016, 1:02 PM  [facility] [address] [place], [place], [address] Phone: [phone]   Fax:  [phone]  Name: Jack [name] MRN: [id number] Date of Birth: [birth year]

## 2016-11-14 NOTE — Therapy (Signed)
[facility] [address] [place], [place], [address] Phone: [phone]   Fax:  [phone]  Physical Therapy Treatment  Patient Details  Name: Jack [name] MRN: [id number] Date of Birth: 10-30-1945 Referring Provider: Meridee Score MD   Encounter Date: 11/14/2016      PT End of Session - 11/14/16 0853    Visit Number 23   Number of Visits 24   Date for PT Re-Evaluation 11/23/15   Authorization Type Medicare & G-codes every 10th visit    PT Start Time 0845   PT Stop Time 0930   PT Time Calculation (min) 45 min   Equipment Utilized During Treatment Gait belt   Activity Tolerance Patient tolerated treatment well;Patient limited by fatigue   Behavior During Therapy [facility] for tasks assessed/performed      Past Medical History:  Diagnosis Date  . Anemia   . Asthma   . Contracture of joint    left knee  . Decubitus ulcer of sacral region, stage 2 03/07/2014  . Diabetes mellitus with peripheral vascular disease ([place])   . End-stage renal disease on hemodialysis [facility])    Started HD March 2014.  Cause of ESRD was DM.  Gets HD at [facility] on [place] on MWF schedule.   Marland Kitchen ESRD on hemodialysis ([place])   . Gangrene ([place])    right BKA  . Gangrene of foot ([place])   . Glaucoma   . Glaucoma 03/07/2014  . Heart murmur   . History of MRSA infection 04/22/2013   Bacteremia assoc w L foot wound infection Mar 2015   . Hyperparathyroidism, secondary renal ([place])   . Hypertension   . Hypothyroidism   . MRSA bacteremia   . Multiple myeloma, without mention of having achieved remission 03/30/2012   Cytogenetic neg on 03/23/2012.  Marland Kitchen Peripheral arterial disease ([place])   . Peripheral vascular disease, unspecified ([place]) 11/19/2012   In the past had R foot toe amps then R TMA. In 2015 had left foot toe amp > then TMA >then L BKA on 05/14/13   . Pneumonia    2012  . Thyroid disease    hyperparathyroidism    Past Surgical History:   Procedure Laterality Date  . ABDOMINAL AORTAGRAM Bilateral 11/06/2012   Procedure: ABDOMINAL AORTAGRAM;  Surgeon: Elam Dutch, MD;  Location: [facility] CATH LAB;  Service: Cardiovascular;  Laterality: Bilateral;  . AMPUTATION Right 11/10/2012   Procedure: AMPUTATION FIRST and SECOND TOES Right Foot;  Surgeon: Elam Dutch, MD;  Location: [place];  Service: Vascular;  Laterality: Right;  . AMPUTATION Left 04/07/2013   Procedure: AMPUTATION DIGIT- LEFT 1ST TOE;  Surgeon: Mal Misty, MD;  Location: [facility] [place];  Service: Vascular;  Laterality: Left;  . AMPUTATION Left 04/26/2013   Procedure: Left Foot Transmetatarsal Amputation;  Surgeon: Newt Minion, MD;  Location: [place];  Service: Orthopedics;  Laterality: Left;  . AMPUTATION Left 05/14/2013   Procedure: AMPUTATION BELOW KNEE;  Surgeon: Newt Minion, MD;  Location: [place];  Service: Orthopedics;  Laterality: Left;  Left Below Knee Amputation  . AMPUTATION Left 07/23/2013   Procedure: AMPUTATION BELOW KNEE;  Surgeon: Newt Minion, MD;  Location: [place];  Service: Orthopedics;  Laterality: Left;  Left Below Knee Amputation Revision  . AMPUTATION Right 02/18/2014   Procedure: AMPUTATION BELOW KNEE;  Surgeon: Newt Minion, MD;  Location: [place];  Service: Orthopedics;  Laterality: Right;  . AMPUTATION Right 08/20/2016   Procedure: RIGHT  LONG FINGER AMPUTATION;  Surgeon: Leanora Cover, MD;  Location: [place];  Service: Orthopedics;  Laterality: Right;  . AV FISTULA PLACEMENT Left 03/25/2012   Procedure: ARTERIOVENOUS (AV) FISTULA CREATION;  Surgeon: Mal Misty, MD;  Location: [place];  Service: Vascular;  Laterality: Left;  . BELOW KNEE LEG AMPUTATION Left 05/14/2013   DR [name]  . BELOW KNEE LEG AMPUTATION Right 02/18/2014   dr duda  . CARDIAC CATHETERIZATION     approx 30 years ago  . CERVICAL DISC SURGERY    . EYE SURGERY Bilateral    CATARACTS  . I&D EXTREMITY Left 04/22/2013   Procedure: IRRIGATION AND DEBRIDEMENT LEFT FIRST TOE  AMPUTATION WOUND ;  Surgeon: Mal Misty, MD;  Location: [place];  Service: Vascular;  Laterality: Left;  . I&D EXTREMITY Right 01/05/2014   Procedure: IRRIGATION AND DEBRIDEMENT Right Heel Ulcer;  Surgeon: Mcarthur Rossetti, MD;  Location: [place];  Service: Orthopedics;  Laterality: Right;  Surgeon Available after 5PM  . INSERTION OF DIALYSIS CATHETER Right 03/19/2012   Procedure: INSERTION OF DIALYSIS CATHETER;  Surgeon: Mal Misty, MD;  Location: [place];  Service: Vascular;  Laterality: Right;  Right Internal Jugular  . LIGATION OF COMPETING BRANCHES OF ARTERIOVENOUS FISTULA Left 05/08/2012   Procedure: LIGATION OF COMPETING BRANCHES OF ARTERIOVENOUS FISTULA;  Surgeon: Mal Misty, MD;  Location: [place];  Service: Vascular;  Laterality: Left;  Ultrasound guided  . REPAIR QUADRICEPS/HAMSTRING MUSCLES Left 05/20/2014   Procedure: Left Hamstring Release;  Surgeon: Newt Minion, MD;  Location: [place];  Service: Orthopedics;  Laterality: Left;  . REVISON OF ARTERIOVENOUS FISTULA Left 01/30/2016   Procedure: CEPHALIC VEIN TURNDOWN TO LEFT UPPER ARM;  Surgeon: Elam Dutch, MD;  Location: [place];  Service: Vascular;  Laterality: Left;  . STUMP REVISION Right 05/20/2014   Procedure: Revision Right Below Knee Amputation;  Surgeon: Newt Minion, MD;  Location: [place];  Service: Orthopedics;  Laterality: Right;  . TEE WITHOUT CARDIOVERSION N/A 04/20/2013   Procedure: TRANSESOPHAGEAL ECHOCARDIOGRAM (TEE);  Surgeon: Josue Hector, MD;  Location: [facility] ENDOSCOPY;  Service: Cardiovascular;  Laterality: N/A;  . THYROIDECTOMY    . TOE AMPUTATION     D/C 04-30-13  . TRANSMETATARSAL AMPUTATION Left 12/16/2012   Procedure: TRANSMETATARSAL AMPUTATION AND VAC PLACEMENT;  Surgeon: Elam Dutch, MD;  Location: [place];  Service: Vascular;  Laterality: Left;    There were no vitals filed for this visit.      Subjective Assessment - 11/14/16 0851    Subjective No new complaints, no falls or pain to report.  Occasionl pain in the finger but not currently, pt took an aleve prior to his appt.    Patient is accompained by: Family member   Pertinent History bilateral transtibial amputations, anemia, asthma, contracture of L knee, decubitus ulcer of sacral region, DM with PVD, ESRD, glaucoma, heart murmur, HTN, hypothyroidism, multiple myeloma, PAD   Limitations Lifting;Standing;Walking;House hold activities   Patient Stated Goals walk without a person assisting him (walk with device only)    Currently in Pain? No/denies              [facility] Adult PT Treatment/Exercise - 11/14/16 0001      Ambulation/Gait   Ambulation/Gait Yes   Ambulation/Gait Assistance 5: Supervision   Ambulation/Gait Assistance Details Supervision for safety, cues for posture, looking straight ahead, and staying closer to walker.    Ambulation Distance (Feet) 60 Feet  30 from mat  to paralell bars/30 back to mat after activity    Assistive device Rolling walker   Gait Pattern Step-through pattern;Decreased hip/knee flexion - right;Decreased hip/knee flexion - left;Trunk flexed;Narrow base of support;Poor foot clearance - right   Ambulation Surface Indoor;Level     Knee/Hip Exercises: Standing   Other Standing Knee Exercises In paralell bars for BUE support, forward marching/backwards walking x5 reps with 1 seated rest break after initial 2 reps. Pt min guard with tactile and verbal cues for posture, looking straight ahead, and being knees up higher in marching.    Other Standing Knee Exercises In paralell bars for BUE support, side stepping both ways x 5 reps with a seat rest break after initial 2 reps. Pt required min guard with repeated verbal and tactile cues for posture and looking forward instead of at feet. Pt demonstrated no loss of balance.             PT Short Term Goals - 11/14/16 4008      PT SHORT TERM GOAL #1   Title Patient will verbalize understanding and return demonstration of updated HEP. (All STGs  Target Date 10/24/16)   Baseline MET 10/24/16   Time 1   Period Months   Status Achieved     PT SHORT TERM GOAL #2   Title Patient will demonstrate ability to reach 5 inches anteriorly and reach within 10 inches from the floor unilateral UE support on RW with supervision to indicate a decrease in his risk of falling.     Baseline MET 10/24/16   Time 1   Period Months   Status Achieved     PT SHORT TERM GOAL #3   Title Patient sit to/from stand and ambulates 50' around furniture with std RW & Bil. prostheses around furniture with supervision.    Baseline MET 10/24/16   Time 1   Period Months   Status Achieved     PT SHORT TERM GOAL #4   Title Patient ambulates 200 feet on indoor surfaces with std rolling walker and supervision    Baseline 10/24/16 progressing but distance not met: Pt ambulated 155' with rollator walker with supervision.    Status Not Met     PT SHORT TERM GOAL #5   Title Patient negotiates ramp & curb with std rolling walker & prostheses with minimal guard.    Baseline MET 10/24/16   Status Achieved           PT Long Term Goals - 11/14/16 0853      PT LONG TERM GOAL #1   Title Patient will verbalize understanding and return demonstration of ongoing HEP program. (All LTGs TARGET DATE: 11/22/2016)    Baseline 09/24/16: can verbalize HEP, has not been consistent with performance   Time 8   Period Weeks   Status On-going     PT LONG TERM GOAL #2   Title Patient will ambulate 200 feet on firm, paved outdoor surfaces with family support to indicate a decrease risk of falling when returning to community ambulation with family support.     Baseline 09/24/16: pt fatigued after 115 feet on indoor surfaces, did not test outdoors   Time 8   Period Weeks   Status On-going     PT LONG TERM GOAL #3   Title Patient ambulates 21' with standard RW and prostheses around furniture modified independent.     Baseline 09/24/16: unable to test due to time constraints and pt  fatigue   Time 8  Period Weeks   Status On-going     PT LONG TERM GOAL #4   Title Patient will be able to ambulate over ramp/curb with rollator style walker with family assistance to indicate a decrease in his risk of falling when returning to community ambulation with family.     Baseline 09/24/16: pt needed up to mod assist for both today with RW   Time 8   Period Weeks   Status On-going     PT LONG TERM GOAL #5   Title Patient will demonstrate ability to reach 8 inches anteriorly and reach within 8 inches from the floor unilateral UE support on RW with supervision to indicate a decrease in his risk of falling.     Time 8   Period Weeks   Status On-going               Plan - 11/14/16 0943    Clinical Impression Statement Pt tolerated treatment well with no limitations due to pain but did demonstrate fatigue with activity. Todays session focused on gait training and dynamic balance activites. Pt on target to meet long term goals next week with anticipated discharge.   Rehab Potential Good   Clinical Impairments Affecting Rehab Potential bilateral transtibial amputations, anemia, asthma, contracture of L knee, decubitus ulcer of sacral region, DM with PVD, ESRD, glaucoma, heart murmur, HTN, hypothyroidism, multiple myeloma, PAD   PT Frequency 2x / week   PT Duration Other (comment)  9 weeks (60 days)   PT Treatment/Interventions ADLs/Self Care Home Management;Neuromuscular re-education;Balance training;Therapeutic exercise;Therapeutic activities;Functional mobility training;Stair training;Gait training;DME Instruction;Patient/family education;Prosthetic Training;Manual techniques;Scar mobilization;Passive range of motion   PT Next Visit Plan Check LTGs with anticipated discharge 11/9   Consulted and Agree with Plan of Care Patient;Family member/caregiver   Family Member Consulted dtr-Cory      Patient will benefit from skilled therapeutic intervention in order to improve the  following deficits and impairments:  Abnormal gait, Decreased activity tolerance, Decreased balance, Decreased coordination, Decreased range of motion, Decreased safety awareness, Decreased knowledge of use of DME, Decreased knowledge of precautions, Decreased endurance, Decreased strength, Difficulty walking, Prosthetic Dependency, Postural dysfunction  Visit Diagnosis: Muscle weakness (generalized)  Other abnormalities of gait and mobility  Unsteadiness on feet     Problem List Patient Active Problem List   Diagnosis Date Noted  . Dyslipidemia 11/21/2015  . Diabetic retinopathy ([place])- left eye 10/02/2015  . Complications, amputation stump late ([place]) 05/20/2014  . Weakness generalized 04/07/2014  . Sepsis ([place]) 04/07/2014  . Decubitus ulcer, stage II 04/07/2014  . Decubitus ulcer of ankle   . Fatigue   . Wound infection 04/06/2014  . Decubitus ulcer of sacral region, stage 2 03/07/2014  . Glaucoma 03/07/2014  . Gout 03/07/2014  . Below knee amputation status ([place]) 02/18/2014  . Osteomyelitis ([place]) 01/04/2014  . Phantom limb ([place]) 12/14/2013  . Type 2 diabetes mellitus with diabetic foot infection ([place])   . Diabetes mellitus with peripheral vascular disease ([place])   . Asthma 05/17/2013  . S/P bilateral below knee amputation ([place]) 05/17/2013  . History of MRSA infection 04/22/2013  . Peripheral vascular disease ([place]) 11/19/2012  . End-stage renal disease on hemodialysis ([place]) 05/05/2012  . Kahler disease ([place]) 03/30/2012  . Anemia in chronic kidney disease 03/19/2012  . Hypothyroidism following radioiodine therapy 03/19/2012  . Anemia, iron deficiency 03/19/2012  . Hypertension 03/18/2012  . Chronic kidney disease (CKD), stage V ([place]) 03/18/2012  . Cardiac conduction disorder 03/18/2012  . MGUS (monoclonal gammopathy  of unknown significance) 02/28/2011  . Monoclonal paraproteinemia 02/28/2011   Toshiro Hanken, SPTA  Duane Earnshaw 11/14/2016, 9:49 AM  [facility] [address] [place], [place], [address] Phone: [phone]   Fax:  [phone]  Name: Jamani Bearce MRN: [id number] Date of Birth: 26-Oct-1945

## 2016-11-15 DIAGNOSIS — D689 Coagulation defect, unspecified: Secondary | ICD-10-CM | POA: Diagnosis not present

## 2016-11-15 DIAGNOSIS — N2581 Secondary hyperparathyroidism of renal origin: Secondary | ICD-10-CM | POA: Diagnosis not present

## 2016-11-15 DIAGNOSIS — N186 End stage renal disease: Secondary | ICD-10-CM | POA: Diagnosis not present

## 2016-11-15 DIAGNOSIS — D631 Anemia in chronic kidney disease: Secondary | ICD-10-CM | POA: Diagnosis not present

## 2016-11-15 DIAGNOSIS — T82598A Other mechanical complication of other cardiac and vascular devices and implants, initial encounter: Secondary | ICD-10-CM | POA: Diagnosis not present

## 2016-11-15 DIAGNOSIS — E1029 Type 1 diabetes mellitus with other diabetic kidney complication: Secondary | ICD-10-CM | POA: Diagnosis not present

## 2016-11-18 DIAGNOSIS — N2581 Secondary hyperparathyroidism of renal origin: Secondary | ICD-10-CM | POA: Diagnosis not present

## 2016-11-18 DIAGNOSIS — N186 End stage renal disease: Secondary | ICD-10-CM | POA: Diagnosis not present

## 2016-11-18 DIAGNOSIS — T82598A Other mechanical complication of other cardiac and vascular devices and implants, initial encounter: Secondary | ICD-10-CM | POA: Diagnosis not present

## 2016-11-18 DIAGNOSIS — D631 Anemia in chronic kidney disease: Secondary | ICD-10-CM | POA: Diagnosis not present

## 2016-11-18 DIAGNOSIS — E1029 Type 1 diabetes mellitus with other diabetic kidney complication: Secondary | ICD-10-CM | POA: Diagnosis not present

## 2016-11-18 DIAGNOSIS — D689 Coagulation defect, unspecified: Secondary | ICD-10-CM | POA: Diagnosis not present

## 2016-11-19 ENCOUNTER — Ambulatory Visit: Payer: Medicare Other

## 2016-11-19 ENCOUNTER — Ambulatory Visit: Payer: Medicare Other | Admitting: Occupational Therapy

## 2016-11-19 VITALS — BP 146/75 | HR 68

## 2016-11-19 DIAGNOSIS — R2689 Other abnormalities of gait and mobility: Secondary | ICD-10-CM

## 2016-11-19 DIAGNOSIS — R2681 Unsteadiness on feet: Secondary | ICD-10-CM

## 2016-11-19 DIAGNOSIS — M25641 Stiffness of right hand, not elsewhere classified: Secondary | ICD-10-CM | POA: Diagnosis not present

## 2016-11-19 DIAGNOSIS — R278 Other lack of coordination: Secondary | ICD-10-CM | POA: Diagnosis not present

## 2016-11-19 DIAGNOSIS — M6281 Muscle weakness (generalized): Secondary | ICD-10-CM | POA: Diagnosis not present

## 2016-11-19 NOTE — Therapy (Signed)
[facility] [address] [place], [place], [address] Phone: [phone]   Fax:  [phone]  Occupational Therapy Treatment  Patient Details  Name: Jack [name] MRN: [id number] Date of Birth: 10-03-1945 Referring Provider: Dr. Leanora Cover   Encounter Date: 11/19/2016  OT End of Session - 11/19/16 0913    Visit Number  10    Number of Visits  17    Date for OT Re-Evaluation  11/30/16    Authorization Type  [employer] MCR    Authorization - Visit Number  10    Authorization - Number of Visits  10    OT Start Time  0846    OT Stop Time  0930    OT Time Calculation (min)  44 min       Past Medical History:  Diagnosis Date  . Anemia   . Asthma   . Contracture of joint    left knee  . Decubitus ulcer of sacral region, stage 2 03/07/2014  . Diabetes mellitus with peripheral vascular disease ([place])   . End-stage renal disease on hemodialysis [facility])    Started HD March 2014.  Cause of ESRD was DM.  Gets HD at [facility] on [place] on MWF schedule.   Marland Kitchen ESRD on hemodialysis ([place])   . Gangrene ([place])    right BKA  . Gangrene of foot ([place])   . Glaucoma   . Glaucoma 03/07/2014  . Heart murmur   . History of MRSA infection 04/22/2013   Bacteremia assoc w L foot wound infection Mar 2015   . Hyperparathyroidism, secondary renal ([place])   . Hypertension   . Hypothyroidism   . MRSA bacteremia   . Multiple myeloma, without mention of having achieved remission 03/30/2012   Cytogenetic neg on 03/23/2012.  Marland Kitchen Peripheral arterial disease ([place])   . Peripheral vascular disease, unspecified ([place]) 11/19/2012   In the past had R foot toe amps then R TMA. In 2015 had left foot toe amp > then TMA >then L BKA on 05/14/13   . Pneumonia    2012  . Thyroid disease    hyperparathyroidism    Past Surgical History:  Procedure Laterality Date  . BELOW KNEE LEG AMPUTATION Left 05/14/2013   DR [name]  . BELOW KNEE LEG AMPUTATION Right 02/18/2014    dr duda  . CARDIAC CATHETERIZATION     approx 30 years ago  . CERVICAL DISC SURGERY    . EYE SURGERY Bilateral    CATARACTS  . THYROIDECTOMY    . TOE AMPUTATION     D/C 04-30-13    There were no vitals filed for this visit.  Subjective Assessment - 11/19/16 0907    Pertinent History  recent right middle finger tip amputation through the middle phalanx, bilateral transtibial amputations, anemia, asthma, contracture of L knee, decubitus ulcer of sacral region, DM with PVD, ESRD, glaucoma, heart murmur, HTN, hypothyroidism, multiple myeloma, PAD    Patient Stated Goals  improve ROM and function in right hand    Currently in Pain?  No/denies    Pain Score  -- sometimes pain with use   sometimes pain with use        Treatment:Pt arrived withmiddle finger was dressed in tefla and gauze with small  amount of bloody drainage. Wound appears more open today. Pt was instructed to discuss with MD how he would like finger dressed. Pt was unwrapped, no/ signs and symptoms of infection. Hand was washed with soap  and water avoiding wound. Finger was dressed with stockinette folded over fingertip, then glove applied to protect hand against infection while performing activities in therapy.  (Glove placed over middle finger to protect against infection during therapy tasks) A/ROM, MP, PIP and composite flexion x 15 reps min v.c Purdue pegboard with RUE max difficulty, min v.c Pacing large wooden pegs into pegboard with RUE min difficulty. Graded clothespins for sustained pinch with all digits except for middle.                    OT Short Term Goals - 11/14/16 1258      OT SHORT TERM GOAL #1   Title  I with inital HEP    Status  Achieved      OT SHORT TERM GOAL #2   Title  Pt / caregiver will verbalize understanding of daily dressing changes, wound care and s/s of infection    Status  Achieved      OT SHORT TERM GOAL #3   Title  Pt/ caregiver will verbalize understanding  of AE/ DME in order to maximize pt safety and indepenence with ADLs/ IADLs.    Status  Achieved      OT SHORT TERM GOAL #4   Title  Pt will resume performance of all basic ADLs at a modified independent level.    Status  On-going 90%   90%     OT SHORT TERM GOAL #5   Title  Pt will demonstrate adequate RUE strength in order to donn prosthesis/ liners independently.    Status  On-going        OT Long Term Goals - 10/01/16 1057      OT LONG TERM GOAL #1   Title  I with updated HEP    Time  8    Period  Weeks    Status  New    Target Date  11/30/16      OT LONG TERM GOAL #2   Title  Pt will report using RUE consistently for ADLS at least 62%M for ADLs/ IADLs with pain less than or equal to 2/10.    Time  8    Period  Weeks    Status  New      OT LONG TERM GOAL #3   Title  Pt will demonstrate RUE grip strength of at least 30 lbs for increased Functional use during ADLs/ IADLs.    Time  8    Period  Weeks    Status  New      OT LONG TERM GOAL #4   Title  Pt will resume performance of light home management/ cooking at a modified independent level    Time  8    Period  Weeks    Status  New            Plan - 11/19/16 3559    Clinical Impression Statement  Pt is progressing towards goals. He demonstrates improving A/ROM and understanding of AE to assist with self care.    Rehab Potential  Good    Current Impairments/barriers affecting progress:  pain    OT Frequency  2x / week    OT Duration  8 weeks    OT Treatment/Interventions  Self-care/ADL training;Moist Heat;Fluidtherapy;DME and/or AE instruction;Splinting;Patient/family education;Therapeutic exercises;Ultrasound;Therapeutic exercise;Therapeutic activities;Cognitive remediation/compensation;Passive range of motion;Functional Mobility Training;Neuromuscular education;Iontophoresis;Cryotherapy;Electrical Stimulation;Energy conservation;Manual Therapy    Plan  continue ROM and light functional use    OT Home  Exercise Plan  A/ROM  HEP    Consulted and Agree with Plan of Care  Patient;Family member/caregiver       Patient will benefit from skilled therapeutic intervention in order to improve the following deficits and impairments:  Decreased range of motion, Decreased coordination, Pain, Impaired UE functional use, Decreased knowledge of use of DME, Decreased balance, Decreased mobility, Decreased strength  Visit Diagnosis: Muscle weakness (generalized)  Stiffness of joint, hand, right  Other lack of coordination  G-Codes - 2016-12-19 0919    Functional Assessment Tool Used (Outpatient only)  clininical impressions: 85-90% composite finger flexion, pain none on arrival, uses grossly 80-90% of the time for light activities only    Carrying, Moving and Handling Objects Current Status [id number])  At least 20 percent but less than 40 percent impaired, limited or restricted    Carrying, Moving and Handling Objects Goal Status (W1027)  At least 1 percent but less than 20 percent impaired, limited or restricted     Occupational Therapy Progress Note  Dates of Reporting Period: 10/01/16 to 19-Dec-2016  Objective Reports of Subjective Statement: see above  Objective Measurements: Pt demonstrates grossly 85-90% composite finger flexion  Goal Update: Pt is progressing towards goals however he remains limited in his RUE functional use while awaiting fingertip wound healing.  Plan: Therapist to decrease frequency of therapy to ever other week after next week.  Reason Skilled Services are Required: Pt requires continued skilled occupational therapy to address pt's RUE ROM, functional use and strength in order to maximize pt's safety and independence with ADLs.  Problem List Patient Active Problem List   Diagnosis Date Noted  . Dyslipidemia 11/21/2015  . Diabetic retinopathy ([place])- left eye 10/02/2015  . Complications, amputation stump late ([place]) 05/20/2014  . Weakness generalized 04/07/2014  . Sepsis ([place])  04/07/2014  . Decubitus ulcer, stage II 04/07/2014  . Decubitus ulcer of ankle   . Fatigue   . Wound infection 04/06/2014  . Decubitus ulcer of sacral region, stage 2 03/07/2014  . Glaucoma 03/07/2014  . Gout 03/07/2014  . Below knee amputation status ([place]) 02/18/2014  . Osteomyelitis ([employer]) 01/04/2014  . Phantom limb ([place]) 12/14/2013  . Type 2 diabetes mellitus with diabetic foot infection ([place])   . Diabetes mellitus with peripheral vascular disease ([place])   . Asthma 05/17/2013  . S/P bilateral below knee amputation ([place]) 05/17/2013  . History of MRSA infection 04/22/2013  . Peripheral vascular disease ([place]) 19-Dec-2012  . End-stage renal disease on hemodialysis ([place]) 05/05/2012  . Kahler disease ([place]) 03/30/2012  . Anemia in chronic kidney disease 03/19/2012  . Hypothyroidism following radioiodine therapy 03/19/2012  . Anemia, iron deficiency 03/19/2012  . Hypertension 03/18/2012  . Chronic kidney disease (CKD), stage V ([place]) 03/18/2012  . Cardiac conduction disorder 03/18/2012  . MGUS (monoclonal gammopathy of unknown significance) 02/28/2011  . Monoclonal paraproteinemia 02/28/2011    RINE,KATHRYN 2016/12/19, 12:21 PM Theone Murdoch, OTR/L [phone] Phone: [phone] 12:31 PM 19-Dec-2016 [facility] [address] [place], [place], [address] Phone: [phone]   Fax:  [phone]  Name: Emad Brechtel MRN: [id number] Date of Birth: 1945-05-01

## 2016-11-19 NOTE — Therapy (Signed)
[facility] [address] [place], [place], [address] Phone: [phone]   Fax:  [phone]  Physical Therapy Treatment  Patient Details  Name: Jack [name] MRN: [id number] Date of Birth: [birth year] Referring Provider: Meridee Score MD    Encounter Date: 11/19/2016  PT End of Session - 11/19/16 0950    Visit Number  24    Number of Visits  24    Date for PT Re-Evaluation  11/23/15    Authorization Type  Medicare & G-codes every 10th visit     PT Start Time  0803    PT Stop Time  0843    PT Time Calculation (min)  40 min    Equipment Utilized During Treatment  Gait belt    Activity Tolerance  Patient tolerated treatment well    Behavior During Therapy  [facility] for tasks assessed/performed       Past Medical History:  Diagnosis Date  . Anemia   . Asthma   . Contracture of joint    left knee  . Decubitus ulcer of sacral region, stage 2 03/07/2014  . Diabetes mellitus with peripheral vascular disease ([place])   . End-stage renal disease on hemodialysis [facility])    Started HD March 2014.  Cause of ESRD was DM.  Gets HD at [facility] on [place] on MWF schedule.   Marland Kitchen ESRD on hemodialysis ([place])   . Gangrene ([place])    right BKA  . Gangrene of foot ([place])   . Glaucoma   . Glaucoma 03/07/2014  . Heart murmur   . History of MRSA infection 04/22/2013   Bacteremia assoc w L foot wound infection Mar 2015   . Hyperparathyroidism, secondary renal ([place])   . Hypertension   . Hypothyroidism   . MRSA bacteremia   . Multiple myeloma, without mention of having achieved remission 03/30/2012   Cytogenetic neg on 03/23/2012.  Marland Kitchen Peripheral arterial disease ([place])   . Peripheral vascular disease, unspecified ([place]) 11/19/2012   In the past had R foot toe amps then R TMA. In 2015 had left foot toe amp > then TMA >then L BKA on 05/14/13   . Pneumonia    2012  . Thyroid disease    hyperparathyroidism    Past Surgical History:  Procedure  Laterality Date  . BELOW KNEE LEG AMPUTATION Left 05/14/2013   DR [name]  . BELOW KNEE LEG AMPUTATION Right 02/18/2014   dr duda  . CARDIAC CATHETERIZATION     approx 30 years ago  . CERVICAL DISC SURGERY    . EYE SURGERY Bilateral    CATARACTS  . THYROIDECTOMY    . TOE AMPUTATION     D/C 04-30-13    Vitals:   11/19/16 0829  BP: (!) 146/75  Pulse: 68    Subjective Assessment - 11/19/16 0807    Subjective  Pt denied falls since last visit. Pt reported MD changed BP medications and he started it last night.     Patient is accompained by:  Family member    Pertinent History  bilateral transtibial amputations, anemia, asthma, contracture of L knee, decubitus ulcer of sacral region, DM with PVD, ESRD, glaucoma, heart murmur, HTN, hypothyroidism, multiple myeloma, PAD    Patient Stated Goals  walk without a person assisting him (walk with device only)     Currently in Pain?  No/denies  [facility] Adult PT Treatment/Exercise - 11/19/16 0834      Ambulation/Gait   Ambulation/Gait  Yes    Ambulation/Gait Assistance  4: Min guard    Ambulation/Gait Assistance Details  Min guard to negotiate obstacles to ensure safety. Frequent cues to improve upright posture and to improve step length. Pt required 3 seated rest breaks 2/2 fatigue.     Ambulation Distance (Feet)  50 Feet 70', 50'   70', 50'   Assistive device  Rolling walker    Gait Pattern  Step-through pattern;Decreased hip/knee flexion - right;Decreased hip/knee flexion - left;Trunk flexed;Narrow base of support;Poor foot clearance - right    Ambulation Surface  Level;Indoor            Self Care: PT Education - 11/19/16 0950    Education provided  Yes    Education Details  PT reviewed HEP with pt, and pt able to verbalize HEP but stated he was not consistently performing due to laziness. PT discussed the importance of performing HEP and continuing to amb. after PT is finished. PT discussed goal  progress and d/c next session.     Person(s) Educated  Patient;Child(ren)    Methods  Explanation    Comprehension  Verbalized understanding       PT Short Term Goals - 11/14/16 4098      PT SHORT TERM GOAL #1   Title  Patient will verbalize understanding and return demonstration of updated HEP. (All STGs Target Date 10/24/16)    Baseline  MET 10/24/16    Time  1    Period  Months    Status  Achieved      PT SHORT TERM GOAL #2   Title  Patient will demonstrate ability to reach 5 inches anteriorly and reach within 10 inches from the floor unilateral UE support on RW with supervision to indicate a decrease in his risk of falling.      Baseline  MET 10/24/16    Time  1    Period  Months    Status  Achieved      PT SHORT TERM GOAL #3   Title  Patient sit to/from stand and ambulates 50' around furniture with std RW & Bil. prostheses around furniture with supervision.     Baseline  MET 10/24/16    Time  1    Period  Months    Status  Achieved      PT SHORT TERM GOAL #4   Title  Patient ambulates 200 feet on indoor surfaces with std rolling walker and supervision     Baseline  10/24/16 progressing but distance not met: Pt ambulated 155' with rollator walker with supervision.     Status  Not Met      PT SHORT TERM GOAL #5   Title  Patient negotiates ramp & curb with std rolling walker & prostheses with minimal guard.     Baseline  MET 10/24/16    Status  Achieved        PT Long Term Goals - 11/19/16 0951      PT LONG TERM GOAL #1   Title  Patient will verbalize understanding and return demonstration of ongoing HEP program. (All LTGs TARGET DATE: 11/22/2016)     Baseline  11/19/16: can verbalize HEP, but reports he has not been consistent with performance    Time  8    Period  Weeks    Status  Not Met      PT LONG TERM GOAL #  2   Title  Patient will ambulate 200 feet on firm, paved outdoor surfaces with family support to indicate a decrease risk of falling when returning to  community ambulation with family support.      Baseline  09/24/16: pt fatigued after 115 feet on indoor surfaces, did not test outdoors    Time  8    Period  Weeks    Status  On-going      PT LONG TERM GOAL #3   Title  Patient ambulates 53' with standard RW and prostheses around furniture modified independent.      Baseline  11/19/16: pt requires min guard to ensure safety    Time  8    Period  Weeks    Status  Partially Met      PT LONG TERM GOAL #4   Title  Patient will be able to ambulate over ramp/curb with rollator style walker with family assistance to indicate a decrease in his risk of falling when returning to community ambulation with family.      Baseline  09/24/16: pt needed up to mod assist for both today with RW    Time  8    Period  Weeks    Status  On-going      PT LONG TERM GOAL #5   Title  Patient will demonstrate ability to reach 8 inches anteriorly and reach within 8 inches from the floor unilateral UE support on RW with supervision to indicate a decrease in his risk of falling.      Baseline  11/19/16: can reach to the floor but can only reach ~3" anteriorly    Time  8    Period  Weeks    Status  Partially Met            Plan - 11/19/16 0953    Clinical Impression Statement  Pt partially met LTGs 3 and 5. Pt did not meet LTG 1. Pt continues to require rest breaks 2/2 fatigue. PT will finish assessing remaining goals next time and d/c.     Rehab Potential  Good    Clinical Impairments Affecting Rehab Potential  bilateral transtibial amputations, anemia, asthma, contracture of L knee, decubitus ulcer of sacral region, DM with PVD, ESRD, glaucoma, heart murmur, HTN, hypothyroidism, multiple myeloma, PAD    PT Frequency  2x / week    PT Duration  Other (comment) 9 weeks (60 days)   9 weeks (60 days)   PT Treatment/Interventions  ADLs/Self Care Home Management;Neuromuscular re-education;Balance training;Therapeutic exercise;Therapeutic activities;Functional  mobility training;Stair training;Gait training;DME Instruction;Patient/family education;Prosthetic Training;Manual techniques;Scar mobilization;Passive range of motion    PT Next Visit Plan  Finish assessing LTGs and d/c    Consulted and Agree with Plan of Care  Patient;Family member/caregiver    Family Member Consulted  dtr-Cory       Patient will benefit from skilled therapeutic intervention in order to improve the following deficits and impairments:  Abnormal gait, Decreased activity tolerance, Decreased balance, Decreased coordination, Decreased range of motion, Decreased safety awareness, Decreased knowledge of use of DME, Decreased knowledge of precautions, Decreased endurance, Decreased strength, Difficulty walking, Prosthetic Dependency, Postural dysfunction  Visit Diagnosis: Other abnormalities of gait and mobility  Muscle weakness (generalized)  Unsteadiness on feet     Problem List Patient Active Problem List   Diagnosis Date Noted  . Dyslipidemia 11/21/2015  . Diabetic retinopathy ([place])- left eye 10/02/2015  . Complications, amputation stump late ([place]) 05/20/2014  . Weakness generalized 04/07/2014  .  Sepsis ([place]) 04/07/2014  . Decubitus ulcer, stage II 04/07/2014  . Decubitus ulcer of ankle   . Fatigue   . Wound infection 04/06/2014  . Decubitus ulcer of sacral region, stage 2 03/07/2014  . Glaucoma 03/07/2014  . Gout 03/07/2014  . Below knee amputation status ([place]) 02/18/2014  . Osteomyelitis ([place]) 01/04/2014  . Phantom limb ([place]) 12/14/2013  . Type 2 diabetes mellitus with diabetic foot infection ([place])   . Diabetes mellitus with peripheral vascular disease ([place])   . Asthma 05/17/2013  . S/P bilateral below knee amputation ([place]) 05/17/2013  . History of MRSA infection 04/22/2013  . Peripheral vascular disease ([name]) 11/19/2012  . End-stage renal disease on hemodialysis ([place]) 05/05/2012  . Kahler disease ([place]) 03/30/2012  . Anemia in chronic kidney disease  03/19/2012  . Hypothyroidism following radioiodine therapy 03/19/2012  . Anemia, iron deficiency 03/19/2012  . Hypertension 03/18/2012  . Chronic kidney disease (CKD), stage V ([place]) 03/18/2012  . Cardiac conduction disorder 03/18/2012  . MGUS (monoclonal gammopathy of unknown significance) 02/28/2011  . Monoclonal paraproteinemia 02/28/2011    Gabby Rackers L 11/19/2016, 9:55 AM  [facility] [address] [place], [place], [address] Phone: [phone]   Fax:  [phone]  Name: Jack [name] MRN: [id number] Date of Birth: January 31, 1945  Geoffry Paradise, PT,DPT 11/19/16 9:56 AM Phone: [phone] Fax: [phone]

## 2016-11-20 DIAGNOSIS — D689 Coagulation defect, unspecified: Secondary | ICD-10-CM | POA: Diagnosis not present

## 2016-11-20 DIAGNOSIS — E1029 Type 1 diabetes mellitus with other diabetic kidney complication: Secondary | ICD-10-CM | POA: Diagnosis not present

## 2016-11-20 DIAGNOSIS — T82598A Other mechanical complication of other cardiac and vascular devices and implants, initial encounter: Secondary | ICD-10-CM | POA: Diagnosis not present

## 2016-11-20 DIAGNOSIS — D631 Anemia in chronic kidney disease: Secondary | ICD-10-CM | POA: Diagnosis not present

## 2016-11-20 DIAGNOSIS — N2581 Secondary hyperparathyroidism of renal origin: Secondary | ICD-10-CM | POA: Diagnosis not present

## 2016-11-20 DIAGNOSIS — N186 End stage renal disease: Secondary | ICD-10-CM | POA: Diagnosis not present

## 2016-11-21 ENCOUNTER — Ambulatory Visit: Payer: Medicare Other | Admitting: Occupational Therapy

## 2016-11-21 ENCOUNTER — Ambulatory Visit: Payer: Medicare Other | Admitting: Physical Therapy

## 2016-11-21 ENCOUNTER — Encounter: Payer: Self-pay | Admitting: Physical Therapy

## 2016-11-21 ENCOUNTER — Encounter: Payer: Self-pay | Admitting: Occupational Therapy

## 2016-11-21 DIAGNOSIS — R278 Other lack of coordination: Secondary | ICD-10-CM | POA: Diagnosis not present

## 2016-11-21 DIAGNOSIS — R2681 Unsteadiness on feet: Secondary | ICD-10-CM | POA: Diagnosis not present

## 2016-11-21 DIAGNOSIS — M25641 Stiffness of right hand, not elsewhere classified: Secondary | ICD-10-CM | POA: Diagnosis not present

## 2016-11-21 DIAGNOSIS — R2689 Other abnormalities of gait and mobility: Secondary | ICD-10-CM

## 2016-11-21 DIAGNOSIS — M6281 Muscle weakness (generalized): Secondary | ICD-10-CM | POA: Diagnosis not present

## 2016-11-21 NOTE — Therapy (Signed)
[facility] [address] [place], [place], [address] Phone: [phone]   Fax:  [phone]  Physical Therapy Treatment  Patient Details  Name: Jack [name] MRN: [id number] Date of Birth: November 10, 1945 Referring Provider: Meridee Score MD    Encounter Date: 11/21/2016  PT End of Session - 11/21/16 1303    Visit Number  25    Number of Visits  25    Date for PT Re-Evaluation  11/23/15    Authorization Type  Medicare & G-codes every 10th visit     PT Start Time  0800    PT Stop Time  0843    PT Time Calculation (min)  43 min    Equipment Utilized During Treatment  Gait belt    Activity Tolerance  Patient tolerated treatment well    Behavior During Therapy  [facility] for tasks assessed/performed       Past Medical History:  Diagnosis Date  . Anemia   . Asthma   . Contracture of joint    left knee  . Decubitus ulcer of sacral region, stage 2 03/07/2014  . Diabetes mellitus with peripheral vascular disease ([place])   . End-stage renal disease on hemodialysis [facility])    Started HD March 2014.  Cause of ESRD was DM.  Gets HD at [facility] on [address] on MWF schedule.   Marland Kitchen ESRD on hemodialysis ([place])   . Gangrene ([place])    right BKA  . Gangrene of foot ([place])   . Glaucoma   . Glaucoma 03/07/2014  . Heart murmur   . History of MRSA infection 04/22/2013   Bacteremia assoc w L foot wound infection Mar 2015   . Hyperparathyroidism, secondary renal ([place])   . Hypertension   . Hypothyroidism   . MRSA bacteremia   . Multiple myeloma, without mention of having achieved remission 03/30/2012   Cytogenetic neg on 03/23/2012.  Marland Kitchen Peripheral arterial disease ([place])   . Peripheral vascular disease, unspecified ([place]) 11/19/2012   In the past had R foot toe amps then R TMA. In 2015 had left foot toe amp > then TMA >then L BKA on 05/14/13   . Pneumonia    2012  . Thyroid disease    hyperparathyroidism    Past Surgical History:  Procedure  Laterality Date  . BELOW KNEE LEG AMPUTATION Left 05/14/2013   DR [name]  . BELOW KNEE LEG AMPUTATION Right 02/18/2014   dr duda  . CARDIAC CATHETERIZATION     approx 30 years ago  . CERVICAL DISC SURGERY    . EYE SURGERY Bilateral    CATARACTS  . THYROIDECTOMY    . TOE AMPUTATION     D/C 04-30-13    There were no vitals filed for this visit.  Subjective Assessment - 11/21/16 0805    Subjective  Patient reports wearing prostheses daily all awake hours including dialysis. No falls. He is walking in home with RW with daughter in home but not near him.     Pertinent History  bilateral transtibial amputations, anemia, asthma, contracture of L knee, decubitus ulcer of sacral region, DM with PVD, ESRD, glaucoma, heart murmur, HTN, hypothyroidism, multiple myeloma, PAD    Limitations  Lifting;Standing;Walking;House hold activities    Patient Stated Goals  walk without a person assisting him (walk with device only)     Currently in Pain?  No/denies         [facility] PT Assessment - 11/21/16 0800      Static  Standing Balance   Static Standing - Balance Support  Right upper extremity supported;Left upper extremity supported    Static Standing - Level of Assistance  6: Modified independent (Device/Increase time)      Dynamic Standing Balance   Dynamic Standing - Balance Support  Right upper extremity supported;Left upper extremity supported;During functional activity    Dynamic Standing - Level of Assistance  6: Modified independent (Device/Increase time)    Dynamic Standing - Comments  Reaches 8" anteriorly & within 8" of floor with UE support on RW                  [facility] Adult PT Treatment/Exercise - 11/21/16 0800      Transfers   Transfers  Sit to Stand;Stand to Sit    Sit to Stand  6: Modified independent (Device/Increase time);With upper extremity assist;With armrests;From chair/3-in-1 to RW    Stand to Sit  6: Modified independent (Device/Increase time);With upper extremity  assist;With armrests;To chair/3-in-1 from RW    Stand Pivot Transfers  5: Supervision turning 180* sit/stand rollator seat with PT stabilizing wal      Ambulation/Gait   Ambulation/Gait  Yes    Ambulation/Gait Assistance  5: Supervision;6: Modified independent (Device/Increase time) Mod Ind std RW household & Supervision rollator     Ambulation/Gait Assistance Details  Dtr demonstrates safe assistance outdoors to enable to walk in/out house & community building from car with family    Ambulation Distance (Feet)  140 Feet 140' & 60' with seated rest on rollator,     Assistive device  Rolling walker;Rollator;Prostheses    Gait Pattern  Step-through pattern    Ambulation Surface  Indoor;Level;Outdoor;Paved    Ramp  5: Supervision Rollator walker & Bil TTA prostheses    Ramp Details (indicate cue type and reason)  dtr demonstrates safe assistance    Curb  4: Min assist rollator walker & bil. TTA prostheses    Curb Details (indicate cue type and reason)  dtr demonstrates safe assistance      Prosthetics   Current prosthetic wear tolerance (days/week)   daily     Current prosthetic wear tolerance (#hours/day)   all awake hours every day including dialysis    Residual limb condition   pt and daughter report no skin issues    Donning Prosthesis  Modified independent (device/increased time)    Doffing Prosthesis  Modified independent (device/increased time)             PT Education - 11/21/16 0830    Education provided  Yes    Education Details  Endurance activity all non-dialysis days either [employer] or walk with family with rollator as far as tolerable rest on seat & then ambulating back to house.     Person(s) Educated  Patient;Child(ren)    Methods  Explanation;Verbal cues    Comprehension  Verbalized understanding       PT Short Term Goals - 11/14/16 1324      PT SHORT TERM GOAL #1   Title  Patient will verbalize understanding and return demonstration of updated HEP. (All STGs  Target Date 10/24/16)    Baseline  MET 10/24/16    Time  1    Period  Months    Status  Achieved      PT SHORT TERM GOAL #2   Title  Patient will demonstrate ability to reach 5 inches anteriorly and reach within 10 inches from the floor unilateral UE support on RW with supervision to indicate a  decrease in his risk of falling.      Baseline  MET 10/24/16    Time  1    Period  Months    Status  Achieved      PT SHORT TERM GOAL #3   Title  Patient sit to/from stand and ambulates 50' around furniture with std RW & Bil. prostheses around furniture with supervision.     Baseline  MET 10/24/16    Time  1    Period  Months    Status  Achieved      PT SHORT TERM GOAL #4   Title  Patient ambulates 200 feet on indoor surfaces with std rolling walker and supervision     Baseline  10/24/16 progressing but distance not met: Pt ambulated 155' with rollator walker with supervision.     Status  Not Met      PT SHORT TERM GOAL #5   Title  Patient negotiates ramp & curb with std rolling walker & prostheses with minimal guard.     Baseline  MET 10/24/16    Status  Achieved        PT Long Term Goals - 11/21/16 1304      PT LONG TERM GOAL #1   Title  Patient will verbalize understanding and return demonstration of ongoing HEP program. (All LTGs TARGET DATE: 11/22/2016)     Baseline  MET 11/21/2016    Time  8    Period  Weeks    Status  Achieved      PT LONG TERM GOAL #2   Title  Patient will ambulate 200 feet on firm, paved outdoor surfaces with family support to indicate a decrease risk of falling when returning to community ambulation with family support.      Baseline  Partially MET 11/21/2016 Patient ambulates 200' total but needs seated rest on rollator seat at 140'. Patient and family report accessing community with rollator walker.     Time  8    Period  Weeks    Status  Partially Met      PT LONG TERM GOAL #3   Title  Patient ambulates 15' with standard RW and prostheses around  furniture modified independent.      Baseline  MET 11/21/2016    Time  8    Period  Weeks    Status  Achieved      PT LONG TERM GOAL #4   Title  Patient will be able to ambulate over ramp/curb with rollator style walker with family assistance to indicate a decrease in his risk of falling when returning to community ambulation with family.      Baseline  MET 11/21/2016    Time  8    Period  Weeks    Status  Achieved      PT LONG TERM GOAL #5   Title  Patient will demonstrate ability to reach 8 inches anteriorly and reach within 8 inches from the floor unilateral UE support on RW with supervision to indicate a decrease in his risk of falling.      Baseline  11/21/16 MET    Time  8    Period  Weeks    Status  Partially Met            Plan - 11/21/16 1328    Clinical Impression Statement  Patient met or partially met all LTGs. Patient is ambulating in home with family within ear shot and accessing community with rollator walker with  family assisting.     Rehab Potential  Good    Clinical Impairments Affecting Rehab Potential  bilateral transtibial amputations, anemia, asthma, contracture of L knee, decubitus ulcer of sacral region, DM with PVD, ESRD, glaucoma, heart murmur, HTN, hypothyroidism, multiple myeloma, PAD    PT Frequency  2x / week    PT Duration  Other (comment) 9 weeks (60 days)    PT Treatment/Interventions  ADLs/Self Care Home Management;Neuromuscular re-education;Balance training;Therapeutic exercise;Therapeutic activities;Functional mobility training;Stair training;Gait training;DME Instruction;Patient/family education;Prosthetic Training;Manual techniques;Scar mobilization;Passive range of motion    PT Next Visit Plan  discharge PT    Consulted and Agree with Plan of Care  Patient;Family member/caregiver    Family Member Consulted  dtr-Cory       Patient will benefit from skilled therapeutic intervention in order to improve the following deficits and impairments:   Abnormal gait, Decreased activity tolerance, Decreased balance, Decreased coordination, Decreased range of motion, Decreased safety awareness, Decreased knowledge of use of DME, Decreased knowledge of precautions, Decreased endurance, Decreased strength, Difficulty walking, Prosthetic Dependency, Postural dysfunction  Visit Diagnosis: Muscle weakness (generalized)  Other abnormalities of gait and mobility  Unsteadiness on feet   G-Codes - 2016-11-29 1430    Functional Assessment Tool Used (Outpatient Only)  Patient able to maintain static standing balance with RW single UE support X 3 minutes, sit to/from stand transfers modified independent to RW     Functional Limitation  Changing and maintaining body position    Changing and Maintaining Body Position Goal Status (C1275)  At least 40 percent but less than 60 percent impaired, limited or restricted    Changing and Maintaining Body Position Discharge Status (T7001)  At least 40 percent but less than 60 percent impaired, limited or restricted       Problem List Patient Active Problem List   Diagnosis Date Noted  . Dyslipidemia 11/21/2015  . Diabetic retinopathy ([place])- left eye 10/02/2015  . Complications, amputation stump late ([place]) 05/20/2014  . Weakness generalized 04/07/2014  . Sepsis ([place]) 04/07/2014  . Decubitus ulcer, stage II 04/07/2014  . Decubitus ulcer of ankle   . Fatigue   . Wound infection 04/06/2014  . Decubitus ulcer of sacral region, stage 2 03/07/2014  . Glaucoma 03/07/2014  . Gout 03/07/2014  . Below knee amputation status ([place]) 02/18/2014  . Osteomyelitis ([place]) 01/04/2014  . Phantom limb ([place]) 12/14/2013  . Type 2 diabetes mellitus with diabetic foot infection ([place])   . Diabetes mellitus with peripheral vascular disease ([place])   . Asthma 05/17/2013  . S/P bilateral below knee amputation ([place]) 05/17/2013  . History of MRSA infection 04/22/2013  . Peripheral vascular disease ([place]) 11/19/2012  . End-stage renal  disease on hemodialysis ([place]) 05/05/2012  . Kahler disease ([employer]) 03/30/2012  . Anemia in chronic kidney disease 03/19/2012  . Hypothyroidism following radioiodine therapy 03/19/2012  . Anemia, iron deficiency 03/19/2012  . Hypertension 03/18/2012  . Chronic kidney disease (CKD), stage V ([place]) 03/18/2012  . Cardiac conduction disorder 03/18/2012  . MGUS (monoclonal gammopathy of unknown significance) 02/28/2011  . Monoclonal paraproteinemia 02/28/2011    PHYSICAL THERAPY DISCHARGE SUMMARY  Visits from Start of Care: 25  Current functional level related to goals / functional outcomes: See above   Remaining deficits: See above   Education / Equipment: HEP & Prosthetic gait  Plan: Patient agrees to discharge.  Patient goals were partially met. Patient is being discharged due to meeting the stated rehab goals.  ?????  Jamey Reas PT, DPT 11/21/2016, 2:32 PM  [facility] [address] [place], [place], [address] Phone: [phone]   Fax:  [phone]  Name: Jack [name] MRN: [id number] Date of Birth: May 05, 1945

## 2016-11-21 NOTE — Therapy (Signed)
[facility] [address] [place], [place], [address] Phone: [phone]   Fax:  [phone]  Occupational Therapy Treatment  Patient Details  Name: Jack [name] MRN: [id number] Date of Birth: [birth year] Referring Provider: Dr. Leanora Cover   Encounter Date: 11/21/2016  OT End of Session - 11/21/16 1208    Visit Number  11    Number of Visits  17    Date for OT Re-Evaluation  11/30/16    Authorization Type  [employer] MCR    Authorization - Visit Number  11    Authorization - Number of Visits  20    OT Start Time  6962    OT Stop Time  0930    OT Time Calculation (min)  40 min    Activity Tolerance  Patient tolerated treatment well    Behavior During Therapy  [facility] for tasks assessed/performed       Past Medical History:  Diagnosis Date  . Anemia   . Asthma   . Contracture of joint    left knee  . Decubitus ulcer of sacral region, stage 2 03/07/2014  . Diabetes mellitus with peripheral vascular disease ([place])   . End-stage renal disease on hemodialysis [facility])    Started HD March 2014.  Cause of ESRD was DM.  Gets HD at [facility] on [address] on MWF schedule.   Marland Kitchen ESRD on hemodialysis ([place])   . Gangrene ([place])    right BKA  . Gangrene of foot ([place])   . Glaucoma   . Glaucoma 03/07/2014  . Heart murmur   . History of MRSA infection 04/22/2013   Bacteremia assoc w L foot wound infection Mar 2015   . Hyperparathyroidism, secondary renal ([place])   . Hypertension   . Hypothyroidism   . MRSA bacteremia   . Multiple myeloma, without mention of having achieved remission 03/30/2012   Cytogenetic neg on 03/23/2012.  Marland Kitchen Peripheral arterial disease ([place])   . Peripheral vascular disease, unspecified ([place]) 11/19/2012   In the past had R foot toe amps then R TMA. In 2015 had left foot toe amp > then TMA >then L BKA on 05/14/13   . Pneumonia    2012  . Thyroid disease    hyperparathyroidism    Past Surgical History:  Procedure  Laterality Date  . BELOW KNEE LEG AMPUTATION Left 05/14/2013   DR [name]  . BELOW KNEE LEG AMPUTATION Right 02/18/2014   dr duda  . CARDIAC CATHETERIZATION     approx 30 years ago  . CERVICAL DISC SURGERY    . EYE SURGERY Bilateral    CATARACTS  . THYROIDECTOMY    . TOE AMPUTATION     D/C 04-30-13    There were no vitals filed for this visit.  Subjective Assessment - 11/21/16 1145    Subjective   Pt reports no significant pain today    Pertinent History  recent right middle finger tip amputation through the middle phalanx, bilateral transtibial amputations, anemia, asthma, contracture of L knee, decubitus ulcer of sacral region, DM with PVD, ESRD, glaucoma, heart murmur, HTN, hypothyroidism, multiple myeloma, PAD    Patient Stated Goals  improve ROM and function in right hand    Currently in Pain?  No/denies                Treatment: Pt arrived withmiddle finger was dressed in stockinette with small  amount of bloody drainage.Pt saw MD and he was instructed to continue soaks  3x day. Pt was unwrapped, no/ signs and symptoms of infection, continued open area at fingertip. Hand was washed with soap and water avoiding wound. Finger was dressed with stockinette folded over fingertip, then glove applied to protect hand against infection while performing activities in therapy.  (Glove placed over middle finger to protect against infection during therapy tasks) A/ROM, MP, PIP and composite flexion, P/ROM PIP flexion x 15 reps min v.c A/ROM PIP flexion 85 , MP flexion 70 Pt ambulated to kitchen with rollator and practiced retrieving a glass of water with close supervision, rest break required due to fatigue. Pt was encouraged to perform at home with supervision/ assistance.            OT Short Term Goals - 11/14/16 1258      OT SHORT TERM GOAL #1   Title  I with inital HEP    Status  Achieved      OT SHORT TERM GOAL #2   Title  Pt / caregiver will verbalize  understanding of daily dressing changes, wound care and s/s of infection    Status  Achieved      OT SHORT TERM GOAL #3   Title  Pt/ caregiver will verbalize understanding of AE/ DME in order to maximize pt safety and indepenence with ADLs/ IADLs.    Status  Achieved      OT SHORT TERM GOAL #4   Title  Pt will resume performance of all basic ADLs at a modified independent level.    Status  On-going 90%      OT SHORT TERM GOAL #5   Title  Pt will demonstrate adequate RUE strength in order to donn prosthesis/ liners independently.    Status  On-going        OT Long Term Goals - 11/21/16 0912      OT LONG TERM GOAL #1   Title  I with updated HEP    Status  Not Met      OT LONG TERM GOAL #2   Title  Pt will report using RUE consistently for ADLS at least 06%C for ADLs/ IADLs with pain less than or equal to 2/10.    Status  On-going pain up to 5-6/10      OT LONG TERM GOAL #3   Title  Pt will demonstrate RUE grip strength of at least 30 lbs for increased Functional use during ADLs/ IADLs.    Status  Not Met      OT LONG TERM GOAL #4   Title  Pt will resume performance of light home management/ cooking at a modified independent level            Plan - 11/21/16 1211    Clinical Impression Statement  Pt demonstrates good overall progress. Pt frequency is decreasing to 1x every other week to allow more time for wound healing.    Rehab Potential  Good    Current Impairments/barriers affecting progress:  pain    OT Frequency  2x / week    OT Duration  8 weeks    OT Treatment/Interventions  Self-care/ADL training;Moist Heat;Fluidtherapy;DME and/or AE instruction;Splinting;Patient/family education;Therapeutic exercises;Ultrasound;Therapeutic exercise;Therapeutic activities;Cognitive remediation/compensation;Passive range of motion;Functional Mobility Training;Neuromuscular education;Iontophoresis;Cryotherapy;Electrical Stimulation;Energy conservation;Manual Therapy    Plan  check  goals and renew when pt returns.    OT Home Exercise Plan  A/ROM HEP    Consulted and Agree with Plan of Care  Patient       Patient will benefit from skilled therapeutic intervention  in order to improve the following deficits and impairments:  Decreased range of motion, Decreased coordination, Pain, Impaired UE functional use, Decreased knowledge of use of DME, Decreased balance, Decreased mobility, Decreased strength  Visit Diagnosis: Muscle weakness (generalized)  Stiffness of joint, hand, right  Other lack of coordination  Other abnormalities of gait and mobility    Problem List Patient Active Problem List   Diagnosis Date Noted  . Dyslipidemia 11/21/2015  . Diabetic retinopathy ([place])- left eye 10/02/2015  . Complications, amputation stump late ([place]) 05/20/2014  . Weakness generalized 04/07/2014  . Sepsis ([place]) 04/07/2014  . Decubitus ulcer, stage II 04/07/2014  . Decubitus ulcer of ankle   . Fatigue   . Wound infection 04/06/2014  . Decubitus ulcer of sacral region, stage 2 03/07/2014  . Glaucoma 03/07/2014  . Gout 03/07/2014  . Below knee amputation status ([place]) 02/18/2014  . Osteomyelitis ([place]) 01/04/2014  . Phantom limb ([place]) 12/14/2013  . Type 2 diabetes mellitus with diabetic foot infection ([place])   . Diabetes mellitus with peripheral vascular disease ([place])   . Asthma 05/17/2013  . S/P bilateral below knee amputation ([place]) 05/17/2013  . History of MRSA infection 04/22/2013  . Peripheral vascular disease ([place]) 11/19/2012  . End-stage renal disease on hemodialysis ([place]) 05/05/2012  . Kahler disease ([place]) 03/30/2012  . Anemia in chronic kidney disease 03/19/2012  . Hypothyroidism following radioiodine therapy 03/19/2012  . Anemia, iron deficiency 03/19/2012  . Hypertension 03/18/2012  . Chronic kidney disease (CKD), stage V ([place]) 03/18/2012  . Cardiac conduction disorder 03/18/2012  . MGUS (monoclonal gammopathy of unknown significance) 02/28/2011  .  Monoclonal paraproteinemia 02/28/2011    Jack [name] 11/21/2016, 12:13 PM  [facility] [address] [place], [place], [address] Phone: [phone]   Fax:  [phone]  Name: Jack [name] MRN: [id number] Date of Birth: 09/01/1945

## 2016-11-22 DIAGNOSIS — D631 Anemia in chronic kidney disease: Secondary | ICD-10-CM | POA: Diagnosis not present

## 2016-11-22 DIAGNOSIS — N2581 Secondary hyperparathyroidism of renal origin: Secondary | ICD-10-CM | POA: Diagnosis not present

## 2016-11-22 DIAGNOSIS — N186 End stage renal disease: Secondary | ICD-10-CM | POA: Diagnosis not present

## 2016-11-22 DIAGNOSIS — D689 Coagulation defect, unspecified: Secondary | ICD-10-CM | POA: Diagnosis not present

## 2016-11-22 DIAGNOSIS — T82598A Other mechanical complication of other cardiac and vascular devices and implants, initial encounter: Secondary | ICD-10-CM | POA: Diagnosis not present

## 2016-11-22 DIAGNOSIS — E1029 Type 1 diabetes mellitus with other diabetic kidney complication: Secondary | ICD-10-CM | POA: Diagnosis not present

## 2016-11-25 DIAGNOSIS — T82598A Other mechanical complication of other cardiac and vascular devices and implants, initial encounter: Secondary | ICD-10-CM | POA: Diagnosis not present

## 2016-11-25 DIAGNOSIS — E1029 Type 1 diabetes mellitus with other diabetic kidney complication: Secondary | ICD-10-CM | POA: Diagnosis not present

## 2016-11-25 DIAGNOSIS — D689 Coagulation defect, unspecified: Secondary | ICD-10-CM | POA: Diagnosis not present

## 2016-11-25 DIAGNOSIS — N2581 Secondary hyperparathyroidism of renal origin: Secondary | ICD-10-CM | POA: Diagnosis not present

## 2016-11-25 DIAGNOSIS — D631 Anemia in chronic kidney disease: Secondary | ICD-10-CM | POA: Diagnosis not present

## 2016-11-25 DIAGNOSIS — N186 End stage renal disease: Secondary | ICD-10-CM | POA: Diagnosis not present

## 2016-11-25 NOTE — Progress Notes (Signed)
HPI:   Jack [name] is a 71 y.o. male, who is here today to follow on some chronic medical problems.  Last seen on 07/24/16.  Diabetes Mellitus II:  Currently on Glipizide 2.5 mg daily.  Checking BS's : 140's FG and 180's at night. "A few 200's. Hypoglycemia:Denies  He is tolerating medications well. He denies abdominal pain, nausea, vomiting, polydipsia, polyuria, or polyphagia. S/P bilateral LE amputation. + PAD.  He is still exercising regularly, upper body exercises.  ESRD on hemodialysis.  Diabetic retinopathy, last ophthalmologist visit 8 months ago. He does not want to continue eye treatments.   Lab Results  Component Value Date   CREATININE 6.60 (H) 08/20/2016   BUN 24 (H) 08/20/2016   NA 140 08/20/2016   K 4.2 08/20/2016   CL 101 08/20/2016   CO2 26 06/07/2016    Lab Results  Component Value Date   HGBA1C 7.2 (H) 07/24/2016    Lab Results  Component Value Date   WBC 8.5 06/07/2016   HGB 11.9 (L) 08/20/2016   HCT 35.0 (L) 08/20/2016   MCV 84.9 06/07/2016   PLT 233 06/07/2016    Since his last OV he undergone amputation of distal phalange middle right finger.   Chronic pain, phantom leg pain, bilateral. 8-10/10 sharp pain on stump. He has not identified exacerbating factors.  Medication contract 08/2015  He is on Percocet 5-325 mg bid as needed, mainly at bedtime.  He denies side effects from medication.   Hyperlipidemia:  Currently on Lipitor 10 mg daily. Following a low fat diet: Yes..  He has not noted side effects with medication.  Lab Results  Component Value Date   CHOL 185 11/21/2015   HDL 45.60 11/21/2015   LDLCALC 116 (H) 11/21/2015   TRIG 114.0 11/21/2015   CHOLHDL 4 11/21/2015    Review of Systems  Constitutional: Positive for fatigue. Negative for activity change, appetite change and fever.  HENT: Negative for nosebleeds, sore throat and trouble swallowing.   Eyes: Negative for redness and visual  disturbance.  Respiratory: Negative for cough, shortness of breath and wheezing.   Cardiovascular: Negative for chest pain and palpitations.  Gastrointestinal: Negative for abdominal pain, constipation, nausea and vomiting.  Endocrine: Negative for cold intolerance, heat intolerance, polydipsia, polyphagia and polyuria.  Musculoskeletal: Positive for arthralgias and gait problem.  Skin: Negative for rash.  Neurological: Negative for syncope, weakness and headaches.  Psychiatric/Behavioral: Negative for confusion. The patient is not nervous/anxious.       Current Outpatient Medications on File Prior to Visit  Medication Sig Dispense Refill  . amLODipine (NORVASC) 5 MG tablet Take 5 mg daily by mouth.    Marland Kitchen aspirin EC 81 MG tablet Take 81 mg by mouth daily.    Marland Kitchen atorvastatin (LIPITOR) 10 MG tablet TAKE 1 TABLET (10 MG TOTAL) BY MOUTH DAILY. 90 tablet 1  . calcium acetate (PHOSLO) 667 MG capsule Take 1,334 mg by mouth 3 (three) times daily with meals.    . diphenhydrAMINE (BENADRYL) 25 MG tablet Take 100 mg by mouth as directed. The morning of fistula cleaning    . glipiZIDE (GLUCOTROL) 5 MG tablet TAKE 0.5 TABLETS (2.5 MG TOTAL) BY MOUTH DAILY BEFORE BREAKFAST. 45 tablet 2  . glucose blood test strip Use to test blood sugars twice daily. 200 each 3  . levothyroxine (SYNTHROID, LEVOTHROID) 150 MCG tablet Take 1/2 tablet by mouth on Mondays and 1 tablet by mouth the rest of the week. [place]  tablet 1  . lisinopril (PRINIVIL,ZESTRIL) 5 MG tablet Take 5 mg daily by mouth.    . multivitamin (RENA-VIT) TABS tablet Take 1 tablet by mouth at bedtime.    Glory Rosebush DELICA LANCETS 15Q MISC Use to test blood sugars once daily. 100 each 3   No current facility-administered medications on file prior to visit.      Past Medical History:  Diagnosis Date  . Anemia   . Asthma   . Contracture of joint    left knee  . Decubitus ulcer of sacral region, stage 2 03/07/2014  . Diabetes mellitus with peripheral  vascular disease ([place])   . End-stage renal disease on hemodialysis [facility])    Started HD March 2014.  Cause of ESRD was DM.  Gets HD at [facility] on [address] on MWF schedule.   Marland Kitchen ESRD on hemodialysis ([place])   . Gangrene ([place])    right BKA  . Gangrene of foot ([place])   . Glaucoma   . Glaucoma 03/07/2014  . Heart murmur   . History of MRSA infection 04/22/2013   Bacteremia assoc w L foot wound infection Mar 2015   . Hyperparathyroidism, secondary renal ([place])   . Hypertension   . Hypothyroidism   . MRSA bacteremia   . Multiple myeloma, without mention of having achieved remission 03/30/2012   Cytogenetic neg on 03/23/2012.  Marland Kitchen Peripheral arterial disease ([place])   . Peripheral vascular disease, unspecified ([place]) 11/19/2012   In the past had R foot toe amps then R TMA. In 2015 had left foot toe amp > then TMA >then L BKA on 05/14/13   . Pneumonia    2012  . Thyroid disease    hyperparathyroidism   Allergies  Allergen Reactions  . Bee Venom Anaphylaxis  . Lisinopril Cough  . Ivp Dye [Iodinated Diagnostic Agents] Hives  . Morphine And Related Hives and Other (See Comments)    Bradycardia states patient    Social History   Socioeconomic History  . Marital status: Divorced    Spouse name: None  . Number of children: None  . Years of education: None  . Highest education level: None  Social Needs  . Financial resource strain: None  . Food insecurity - worry: None  . Food insecurity - inability: None  . Transportation needs - medical: None  . Transportation needs - non-medical: None  Occupational History  . None  Tobacco Use  . Smoking status: Former Smoker    Types: Cigarettes    Last attempt to quit: 03/19/1982    Years since quitting: 34.7  . Smokeless tobacco: Never Used  Substance and Sexual Activity  . Alcohol use: No    Alcohol/week: 0.0 oz    Comment: rare  . Drug use: No  . Sexual activity: No  Other Topics Concern  . None  Social History Narrative  . None     Vitals:   11/26/16 0813  BP: 140/88  Pulse: 77  Resp: 12  Temp: 98.6 F (37 C)  SpO2: 97%   Body mass index is 23.39 kg/m.   Physical Exam  Nursing note and vitals reviewed. Constitutional: He is oriented to person, place, and time. He appears well-developed. No distress.  HENT:  Head: Normocephalic and atraumatic.  Mouth/Throat: Oropharynx is clear and moist and mucous membranes are normal.  Eyes: Conjunctivae and EOM are normal. Left pupil is not reactive. Pupils are unequal.  Cardiovascular: Normal rate. An irregular rhythm present.  Occasional extrasystoles are present.  Murmur (SEM I-II/VI RUSB) heard. Respiratory: Effort normal and breath sounds normal. No respiratory distress.  GI: Soft. He exhibits no mass. There is no tenderness.  Musculoskeletal: He exhibits no edema.  LE amputation ,bilateral. Bilateral prosthetic legs.  Lymphadenopathy:    He has no cervical adenopathy.  Neurological: He is alert and oriented to person, place, and time. He has normal strength.  He is in a wheel chair.  Skin: Skin is warm. No erythema.  Psychiatric: He has a normal mood and affect. Cognition and memory are normal.  Well groomed, good eye contact.     ASSESSMENT AND PLAN:   Jack [name] was seen today for follow-up.  Diagnoses and all orders for this visit:  Lab Results  Component Value Date   CHOL 129 11/26/2016   HDL 37.50 (L) 11/26/2016   LDLCALC 75 11/26/2016   TRIG 86.0 11/26/2016   CHOLHDL 3 11/26/2016   Lab Results  Component Value Date   HGBA1C 7.2 (H) 11/26/2016    Diabetes mellitus with peripheral vascular disease ([place])  HgA1C pending. No changes in current management. Regular exercise and healthy diet with avoidance of added sugar food intake is an important part of treatment and recommended. Annual eye exam strongly recommended, he is not interested in continue following with ophthalmologist. F/U in 4 months  -     Hemoglobin A1c -      Fructosamine  Dyslipidemia  Continue Lipitor 10 mg daily.  We will follow labs done today and will give further recommendations accordingly.  -     Lipid panel   Phantom limb syndrome with pain ([place])  Medication contract renewed. [place] controlled subs reviewed. Side effects of medication discussed. Rx x 3 given today. F/U in 3-4 months.  Will consider blood drug tox screening next visit.   -     oxyCODONE-acetaminophen (ROXICET) 5-325 MG tablet; Take 1 tablet every 12 (twelve) hours as needed by mouth for moderate pain or severe pain.   Need for influenza vaccination -     Flu vaccine HIGH DOSE PF      -Jack [name] was advised to return sooner than planned today if new concerns arise.       Isaura Schiller G. [place], MD  [facility]. [place] office.

## 2016-11-26 ENCOUNTER — Encounter: Payer: Self-pay | Admitting: Family Medicine

## 2016-11-26 ENCOUNTER — Ambulatory Visit (INDEPENDENT_AMBULATORY_CARE_PROVIDER_SITE_OTHER): Payer: Medicare Other | Admitting: Family Medicine

## 2016-11-26 VITALS — BP 140/88 | HR 77 | Temp 98.6°F | Resp 12 | Ht 70.0 in

## 2016-11-26 DIAGNOSIS — G546 Phantom limb syndrome with pain: Secondary | ICD-10-CM

## 2016-11-26 DIAGNOSIS — E1151 Type 2 diabetes mellitus with diabetic peripheral angiopathy without gangrene: Secondary | ICD-10-CM | POA: Diagnosis not present

## 2016-11-26 DIAGNOSIS — Z23 Encounter for immunization: Secondary | ICD-10-CM | POA: Diagnosis not present

## 2016-11-26 DIAGNOSIS — E785 Hyperlipidemia, unspecified: Secondary | ICD-10-CM | POA: Diagnosis not present

## 2016-11-26 LAB — HEMOGLOBIN A1C: Hgb A1c MFr Bld: 7.2 % — ABNORMAL HIGH (ref 4.6–6.5)

## 2016-11-26 LAB — LIPID PANEL
CHOLESTEROL: 129 mg/dL (ref 0–200)
HDL: 37.5 mg/dL — ABNORMAL LOW (ref >39.00)
LDL Cholesterol: 75 mg/dL (ref 0–99)
NonHDL: 91.98
TRIGLYCERIDES: 86 mg/dL (ref 0.0–149.0)
Total CHOL/HDL Ratio: 3
VLDL: 17.2 mg/dL (ref 0.0–40.0)

## 2016-11-26 MED ORDER — OXYCODONE-ACETAMINOPHEN 5-325 MG PO TABS: 1.0000 | ORAL_TABLET | Freq: Two times a day (BID) | ORAL | 0 refills | Status: DC | PRN

## 2016-11-26 NOTE — Patient Instructions (Signed)
A few things to remember from today's visit:   Diabetes mellitus with peripheral vascular disease ([place]) - Plan: amLODipine (NORVASC) 5 MG tablet, Hemoglobin A1c, Fructosamine  Dyslipidemia - Plan: Lipid panel  Hypertension, unspecified type  Phantom limb syndrome with pain ([place]) - Plan: oxyCODONE-acetaminophen (ROXICET) 5-325 MG tablet, DISCONTINUED: oxyCODONE-acetaminophen (ROXICET) 5-325 MG tablet, DISCONTINUED: oxyCODONE-acetaminophen (ROXICET) 5-325 MG tablet  No changes today. Diabetes Mellitus and Food It is important for you to manage your blood sugar (glucose) level. Your blood glucose level can be greatly affected by what you eat. Eating healthier foods in the appropriate amounts throughout the day at about the same time each day will help you control your blood glucose level. It can also help slow or prevent worsening of your diabetes mellitus. Healthy eating may even help you improve the level of your blood pressure and reach or maintain a healthy weight. General recommendations for healthful eating and cooking habits include:  Eating meals and snacks regularly. Avoid going long periods of time without eating to lose weight.  Eating a diet that consists mainly of plant-based foods, such as fruits, vegetables, nuts, legumes, and whole grains.  Using low-heat cooking methods, such as baking, instead of high-heat cooking methods, such as deep frying.  Work with your dietitian to make sure you understand how to use the Nutrition Facts information on food labels. How can food affect me? Carbohydrates Carbohydrates affect your blood glucose level more than any other type of food. Your dietitian will help you determine how many carbohydrates to eat at each meal and teach you how to count carbohydrates. Counting carbohydrates is important to keep your blood glucose at a healthy level, especially if you are using insulin or taking certain medicines for diabetes mellitus. Alcohol Alcohol  can cause sudden decreases in blood glucose (hypoglycemia), especially if you use insulin or take certain medicines for diabetes mellitus. Hypoglycemia can be a life-threatening condition. Symptoms of hypoglycemia (sleepiness, dizziness, and disorientation) are similar to symptoms of having too much alcohol. If your health care provider has given you approval to drink alcohol, do so in moderation and use the following guidelines:  Women should not have more than one drink per day, and men should not have more than two drinks per day. One drink is equal to: ? 12 oz of beer. ? 5 oz of wine. ? 1 oz of hard liquor.  Do not drink on an empty stomach.  Keep yourself hydrated. Have water, diet soda, or unsweetened iced tea.  Regular soda, juice, and other mixers might contain a lot of carbohydrates and should be counted.  What foods are not recommended? As you make food choices, it is important to remember that all foods are not the same. Some foods have fewer nutrients per serving than other foods, even though they might have the same number of calories or carbohydrates. It is difficult to get your body what it needs when you eat foods with fewer nutrients. Examples of foods that you should avoid that are high in calories and carbohydrates but low in nutrients include:  Trans fats (most processed foods list trans fats on the Nutrition Facts label).  Regular soda.  Juice.  Candy.  Sweets, such as cake, pie, doughnuts, and cookies.  Fried foods.  What foods can I eat? Eat nutrient-rich foods, which will nourish your body and keep you healthy. The food you should eat also will depend on several factors, including:  The calories you need.  The medicines you take.  Your weight.  Your blood glucose level.  Your blood pressure level.  Your cholesterol level.  You should eat a variety of foods, including:  Protein. ? Lean cuts of meat. ? Proteins low in saturated fats, such as  fish, egg whites, and beans. Avoid processed meats.  Fruits and vegetables. ? Fruits and vegetables that may help control blood glucose levels, such as apples, mangoes, and yams.  Dairy products. ? Choose fat-free or low-fat dairy products, such as milk, yogurt, and cheese.  Grains, bread, pasta, and rice. ? Choose whole grain products, such as multigrain bread, whole oats, and brown rice. These foods may help control blood pressure.  Fats. ? Foods containing healthful fats, such as nuts, avocado, olive oil, canola oil, and fish.  Does everyone with diabetes mellitus have the same meal plan? Because every person with diabetes mellitus is different, there is not one meal plan that works for everyone. It is very important that you meet with a dietitian who will help you create a meal plan that is just right for you. This information is not intended to replace advice given to you by your health care provider. Make sure you discuss any questions you have with your health care provider. Document Released: 09/27/2004 Document Revised: 06/08/2015 Document Reviewed: 11/27/2012 Elsevier Interactive Patient Education  2017 [place].  Please be sure medication list is accurate. If a new problem present, please set up appointment sooner than planned today.

## 2016-11-27 DIAGNOSIS — N2581 Secondary hyperparathyroidism of renal origin: Secondary | ICD-10-CM | POA: Diagnosis not present

## 2016-11-27 DIAGNOSIS — T82598A Other mechanical complication of other cardiac and vascular devices and implants, initial encounter: Secondary | ICD-10-CM | POA: Diagnosis not present

## 2016-11-27 DIAGNOSIS — E1029 Type 1 diabetes mellitus with other diabetic kidney complication: Secondary | ICD-10-CM | POA: Diagnosis not present

## 2016-11-27 DIAGNOSIS — N186 End stage renal disease: Secondary | ICD-10-CM | POA: Diagnosis not present

## 2016-11-27 DIAGNOSIS — D689 Coagulation defect, unspecified: Secondary | ICD-10-CM | POA: Diagnosis not present

## 2016-11-27 DIAGNOSIS — D631 Anemia in chronic kidney disease: Secondary | ICD-10-CM | POA: Diagnosis not present

## 2016-11-28 ENCOUNTER — Ambulatory Visit: Payer: Medicare Other | Admitting: Occupational Therapy

## 2016-11-28 DIAGNOSIS — M6281 Muscle weakness (generalized): Secondary | ICD-10-CM

## 2016-11-28 DIAGNOSIS — R278 Other lack of coordination: Secondary | ICD-10-CM

## 2016-11-28 DIAGNOSIS — M25641 Stiffness of right hand, not elsewhere classified: Secondary | ICD-10-CM | POA: Diagnosis not present

## 2016-11-28 DIAGNOSIS — R2689 Other abnormalities of gait and mobility: Secondary | ICD-10-CM | POA: Diagnosis not present

## 2016-11-28 DIAGNOSIS — R2681 Unsteadiness on feet: Secondary | ICD-10-CM | POA: Diagnosis not present

## 2016-11-28 LAB — FRUCTOSAMINE: Fructosamine: 461 umol/L — ABNORMAL HIGH (ref 190–270)

## 2016-11-28 NOTE — Therapy (Signed)
[facility] [address] [place], [place], [address] Phone: [phone]   Fax:  [phone]  Occupational Therapy Treatment  Patient Details  Name: Jack [name] MRN: [id number] Date of Birth: 1945/03/12 Referring Provider: Dr. Leanora Cover   Encounter Date: 11/28/2016  OT End of Session - 11/28/16 1115    Visit Number  12    Number of Visits  20    Date for OT Re-Evaluation  01/27/17    Authorization Type  [employer] MCR    Authorization - Visit Number  12    Authorization - Number of Visits  20    OT Start Time  [id number]    OT Stop Time  0900    OT Time Calculation (min)  44 min    Activity Tolerance  Patient tolerated treatment well    Behavior During Therapy  [facility] for tasks assessed/performed       Past Medical History:  Diagnosis Date  . Anemia   . Asthma   . Contracture of joint    left knee  . Decubitus ulcer of sacral region, stage 2 03/07/2014  . Diabetes mellitus with peripheral vascular disease ([place])   . End-stage renal disease on hemodialysis [facility])    Started HD March 2014.  Cause of ESRD was DM.  Gets HD at [facility] on [address] on MWF schedule.   Marland Kitchen ESRD on hemodialysis ([place])   . Gangrene ([place])    right BKA  . Gangrene of foot ([place])   . Glaucoma   . Glaucoma 03/07/2014  . Heart murmur   . History of MRSA infection 04/22/2013   Bacteremia assoc w L foot wound infection Mar 2015   . Hyperparathyroidism, secondary renal ([place])   . Hypertension   . Hypothyroidism   . MRSA bacteremia   . Multiple myeloma, without mention of having achieved remission 03/30/2012   Cytogenetic neg on 03/23/2012.  Marland Kitchen Peripheral arterial disease ([place])   . Peripheral vascular disease, unspecified ([place]) 11/19/2012   In the past had R foot toe amps then R TMA. In 2015 had left foot toe amp > then TMA >then L BKA on 05/14/13   . Pneumonia    2012  . Thyroid disease    hyperparathyroidism    Past Surgical History:   Procedure Laterality Date  . ABDOMINAL AORTAGRAM Bilateral 11/06/2012   Procedure: ABDOMINAL AORTAGRAM;  Surgeon: Elam Dutch, MD;  Location: [facility] CATH LAB;  Service: Cardiovascular;  Laterality: Bilateral;  . AMPUTATION Right 11/10/2012   Procedure: AMPUTATION FIRST and SECOND TOES Right Foot;  Surgeon: Elam Dutch, MD;  Location: [facility];  Service: Vascular;  Laterality: Right;  . AMPUTATION Left 04/07/2013   Procedure: AMPUTATION DIGIT- LEFT 1ST TOE;  Surgeon: Mal Misty, MD;  Location: [place];  Service: Vascular;  Laterality: Left;  . AMPUTATION Left 04/26/2013   Procedure: Left Foot Transmetatarsal Amputation;  Surgeon: Newt Minion, MD;  Location: [place];  Service: Orthopedics;  Laterality: Left;  . AMPUTATION Left 05/14/2013   Procedure: AMPUTATION BELOW KNEE;  Surgeon: Newt Minion, MD;  Location: [place];  Service: Orthopedics;  Laterality: Left;  Left Below Knee Amputation  . AMPUTATION Left 07/23/2013   Procedure: AMPUTATION BELOW KNEE;  Surgeon: Newt Minion, MD;  Location: [place];  Service: Orthopedics;  Laterality: Left;  Left Below Knee Amputation Revision  . AMPUTATION Right 02/18/2014   Procedure: AMPUTATION BELOW KNEE;  Surgeon: Newt Minion, MD;  Location:  [place];  Service: Orthopedics;  Laterality: Right;  . AMPUTATION Right 08/20/2016   Procedure: RIGHT LONG FINGER AMPUTATION;  Surgeon: Leanora Cover, MD;  Location: [place];  Service: Orthopedics;  Laterality: Right;  . AV FISTULA PLACEMENT Left 03/25/2012   Procedure: ARTERIOVENOUS (AV) FISTULA CREATION;  Surgeon: Mal Misty, MD;  Location: [facility];  Service: Vascular;  Laterality: Left;  . BELOW KNEE LEG AMPUTATION Left 05/14/2013   DR [name]  . BELOW KNEE LEG AMPUTATION Right 02/18/2014   dr duda  . CARDIAC CATHETERIZATION     approx 30 years ago  . CERVICAL DISC SURGERY    . EYE SURGERY Bilateral    CATARACTS  . I&D EXTREMITY Left 04/22/2013   Procedure: IRRIGATION AND DEBRIDEMENT LEFT FIRST TOE  AMPUTATION WOUND ;  Surgeon: Mal Misty, MD;  Location: [place];  Service: Vascular;  Laterality: Left;  . I&D EXTREMITY Right 01/05/2014   Procedure: IRRIGATION AND DEBRIDEMENT Right Heel Ulcer;  Surgeon: Mcarthur Rossetti, MD;  Location: [place];  Service: Orthopedics;  Laterality: Right;  Surgeon Available after 5PM  . INSERTION OF DIALYSIS CATHETER Right 03/19/2012   Procedure: INSERTION OF DIALYSIS CATHETER;  Surgeon: Mal Misty, MD;  Location: [place];  Service: Vascular;  Laterality: Right;  Right Internal Jugular  . LIGATION OF COMPETING BRANCHES OF ARTERIOVENOUS FISTULA Left 05/08/2012   Procedure: LIGATION OF COMPETING BRANCHES OF ARTERIOVENOUS FISTULA;  Surgeon: Mal Misty, MD;  Location: [place];  Service: Vascular;  Laterality: Left;  Ultrasound guided  . REPAIR QUADRICEPS/HAMSTRING MUSCLES Left 05/20/2014   Procedure: Left Hamstring Release;  Surgeon: Newt Minion, MD;  Location: [place];  Service: Orthopedics;  Laterality: Left;  . REVISON OF ARTERIOVENOUS FISTULA Left 01/30/2016   Procedure: CEPHALIC VEIN TURNDOWN TO LEFT UPPER ARM;  Surgeon: Elam Dutch, MD;  Location: [place];  Service: Vascular;  Laterality: Left;  . STUMP REVISION Right 05/20/2014   Procedure: Revision Right Below Knee Amputation;  Surgeon: Newt Minion, MD;  Location: [place];  Service: Orthopedics;  Laterality: Right;  . TEE WITHOUT CARDIOVERSION N/A 04/20/2013   Procedure: TRANSESOPHAGEAL ECHOCARDIOGRAM (TEE);  Surgeon: Josue Hector, MD;  Location: [facility] ENDOSCOPY;  Service: Cardiovascular;  Laterality: N/A;  . THYROIDECTOMY    . TOE AMPUTATION     D/C 04-30-13  . TRANSMETATARSAL AMPUTATION Left 12/16/2012   Procedure: TRANSMETATARSAL AMPUTATION AND VAC PLACEMENT;  Surgeon: Elam Dutch, MD;  Location: [facility];  Service: Vascular;  Laterality: Left;    There were no vitals filed for this visit.  Subjective Assessment - 11/28/16 1114    Subjective   Pt reports no significant pain today    Pertinent  History  recent right middle finger tip amputation through the middle phalanx, bilateral transtibial amputations, anemia, asthma, contracture of L knee, decubitus ulcer of sacral region, DM with PVD, ESRD, glaucoma, heart murmur, HTN, hypothyroidism, multiple myeloma, PAD    Patient Stated Goals  improve ROM and function in right hand    Currently in Pain?  Yes    Pain Score  5     Pain Location  Hand    Pain Orientation  Right    Pain Type  Acute pain    Pain Onset  More than a month ago    Pain Frequency  Intermittent    Aggravating Factors   movement    Pain Relieving Factors  inactivity  Treatment:Pt arrived withmiddle finger was dressed in stockinette with no significant drainage and no s/s of infection. Pt was unwrapped, no/ signs and symptoms of infection, continued open area at fingertip. Hand was washed with soap and water avoiding wound. Finger was dressed with stockinette folded over fingertip, then glove applied to protect hand against infection while performing activities in therapy.  (Glove placed over middle finger to protect against infection during therapy tasks) A/ROM, MP, PIP and composite flexion, P/ROM PIP flexion x 15 reps min v.c Fine motor coordination task with RUE to copy small peg design, min-mod difficulty/ v.c Standing to place/ retrieve items from overhead shelf with RUE, close supervision and min v.c Pt reported he did not eat breakfast, pt was provided with a small soda per his request. Pt ambulated to front lobby using rollator and close supervision.                 OT Short Term Goals - 11/28/16 1116      OT SHORT TERM GOAL #1   Title  I with inital HEP    Status  Achieved      OT SHORT TERM GOAL #2   Title  Pt / caregiver will verbalize understanding of daily dressing changes, wound care and s/s of infection    Status  Achieved      OT SHORT TERM GOAL #3   Title  Pt/ caregiver will verbalize understanding of AE/ DME  in order to maximize pt safety and indepenence with ADLs/ IADLs.    Status  Achieved      OT SHORT TERM GOAL #4   Title  Pt will resume performance of all basic ADLs at a modified independent level. due 12/27/16    Status  On-going 90%      OT SHORT TERM GOAL #5   Title  Pt will demonstrate adequate RUE strength in order to donn prosthesis/ liners independently. due 12/27/16    Time  4    Period  Weeks    Status  On-going        OT Long Term Goals - 11/28/16 1118      OT LONG TERM GOAL #1   Title  I with updated HEP due 01/26/17    Time  8    Period  Weeks    Status  On-going      OT LONG TERM GOAL #2   Title  Pt will report using RUE consistently for ADLS at least 23%F for ADLs/ IADLs with pain less than or equal to 2/10.    Time  8    Period  Weeks    Status  On-going pain up to 5-6/10      OT LONG TERM GOAL #3   Title  Pt will demonstrate RUE grip strength of at least 30 lbs for increased Functional use during ADLs/ IADLs.    Status  Not Met      OT LONG TERM GOAL #4   Title  Pt will resume performance of light home management/ cooking at a modified independent level- due 01/26/17    Status  On-going            Plan - 11/28/16 1120    Clinical Impression Statement  Pt is progressing towards goals slowly due to slow wound healing. Therapist to decrease frequency to allow increased time for wound healing.    Occupational performance deficits (Please refer to evaluation for details):  ADL's;IADL's;Work;Play;Leisure    Rehab Potential  Good  Current Impairments/barriers affecting progress:  pain    OT Frequency  1x / week may decrease to every other week dependent upon pt needs    OT Duration  8 weeks    OT Treatment/Interventions  Self-care/ADL training;Moist Heat;Fluidtherapy;DME and/or AE instruction;Splinting;Patient/family education;Therapeutic exercises;Ultrasound;Therapeutic exercise;Therapeutic activities;Cognitive remediation/compensation;Passive range of  motion;Functional Mobility Training;Neuromuscular education;Iontophoresis;Cryotherapy;Electrical Stimulation;Energy conservation;Manual Therapy    Plan  work towards unmet goals    OT Home Exercise Plan  A/ROM HEP    Consulted and Agree with Plan of Care  Patient       Patient will benefit from skilled therapeutic intervention in order to improve the following deficits and impairments:  Decreased range of motion, Decreased coordination, Pain, Impaired UE functional use, Decreased knowledge of use of DME, Decreased balance, Decreased mobility, Decreased strength  Visit Diagnosis: Muscle weakness (generalized)  Stiffness of joint, hand, right  Other lack of coordination    Problem List Patient Active Problem List   Diagnosis Date Noted  . Dyslipidemia 11/21/2015  . Diabetic retinopathy ([place])- left eye 10/02/2015  . Complications, amputation stump late ([place]) 05/20/2014  . Weakness generalized 04/07/2014  . Sepsis ([place]) 04/07/2014  . Decubitus ulcer, stage II 04/07/2014  . Decubitus ulcer of ankle   . Fatigue   . Wound infection 04/06/2014  . Decubitus ulcer of sacral region, stage 2 03/07/2014  . Glaucoma 03/07/2014  . Gout 03/07/2014  . Below knee amputation status ([place]) 02/18/2014  . Osteomyelitis ([place]) 01/04/2014  . Phantom limb ([place]) 12/14/2013  . Type 2 diabetes mellitus with diabetic foot infection ([place])   . Diabetes mellitus with peripheral vascular disease ([place])   . Asthma 05/17/2013  . S/P bilateral below knee amputation ([place]) 05/17/2013  . History of MRSA infection 04/22/2013  . Peripheral vascular disease ([place]) 11/19/2012  . End-stage renal disease on hemodialysis ([place]) 05/05/2012  . Kahler disease ([place]) 03/30/2012  . Anemia in chronic kidney disease 03/19/2012  . Hypothyroidism following radioiodine therapy 03/19/2012  . Anemia, iron deficiency 03/19/2012  . Hypertension 03/18/2012  . Chronic kidney disease (CKD), stage V ([place]) 03/18/2012  . Cardiac  conduction disorder 03/18/2012  . MGUS (monoclonal gammopathy of unknown significance) 02/28/2011  . Monoclonal paraproteinemia 02/28/2011    Jack [name] 11/28/2016, 11:22 AM  [facility] [address] [place], [place], [address] Phone: [phone]   Fax:  [phone]  Name: Jack [name] MRN: [id number] Date of Birth: 12/05/1945

## 2016-11-29 DIAGNOSIS — D631 Anemia in chronic kidney disease: Secondary | ICD-10-CM | POA: Diagnosis not present

## 2016-11-29 DIAGNOSIS — T82598A Other mechanical complication of other cardiac and vascular devices and implants, initial encounter: Secondary | ICD-10-CM | POA: Diagnosis not present

## 2016-11-29 DIAGNOSIS — E1029 Type 1 diabetes mellitus with other diabetic kidney complication: Secondary | ICD-10-CM | POA: Diagnosis not present

## 2016-11-29 DIAGNOSIS — N186 End stage renal disease: Secondary | ICD-10-CM | POA: Diagnosis not present

## 2016-11-29 DIAGNOSIS — D689 Coagulation defect, unspecified: Secondary | ICD-10-CM | POA: Diagnosis not present

## 2016-11-29 DIAGNOSIS — N2581 Secondary hyperparathyroidism of renal origin: Secondary | ICD-10-CM | POA: Diagnosis not present

## 2016-12-01 DIAGNOSIS — D631 Anemia in chronic kidney disease: Secondary | ICD-10-CM | POA: Diagnosis not present

## 2016-12-01 DIAGNOSIS — D689 Coagulation defect, unspecified: Secondary | ICD-10-CM | POA: Diagnosis not present

## 2016-12-01 DIAGNOSIS — E1029 Type 1 diabetes mellitus with other diabetic kidney complication: Secondary | ICD-10-CM | POA: Diagnosis not present

## 2016-12-01 DIAGNOSIS — T82598A Other mechanical complication of other cardiac and vascular devices and implants, initial encounter: Secondary | ICD-10-CM | POA: Diagnosis not present

## 2016-12-01 DIAGNOSIS — N2581 Secondary hyperparathyroidism of renal origin: Secondary | ICD-10-CM | POA: Diagnosis not present

## 2016-12-01 DIAGNOSIS — N186 End stage renal disease: Secondary | ICD-10-CM | POA: Diagnosis not present

## 2016-12-03 DIAGNOSIS — D631 Anemia in chronic kidney disease: Secondary | ICD-10-CM | POA: Diagnosis not present

## 2016-12-03 DIAGNOSIS — D689 Coagulation defect, unspecified: Secondary | ICD-10-CM | POA: Diagnosis not present

## 2016-12-03 DIAGNOSIS — T82598A Other mechanical complication of other cardiac and vascular devices and implants, initial encounter: Secondary | ICD-10-CM | POA: Diagnosis not present

## 2016-12-03 DIAGNOSIS — N186 End stage renal disease: Secondary | ICD-10-CM | POA: Diagnosis not present

## 2016-12-03 DIAGNOSIS — E1029 Type 1 diabetes mellitus with other diabetic kidney complication: Secondary | ICD-10-CM | POA: Diagnosis not present

## 2016-12-03 DIAGNOSIS — N2581 Secondary hyperparathyroidism of renal origin: Secondary | ICD-10-CM | POA: Diagnosis not present

## 2016-12-06 DIAGNOSIS — D631 Anemia in chronic kidney disease: Secondary | ICD-10-CM | POA: Diagnosis not present

## 2016-12-06 DIAGNOSIS — E1029 Type 1 diabetes mellitus with other diabetic kidney complication: Secondary | ICD-10-CM | POA: Diagnosis not present

## 2016-12-06 DIAGNOSIS — N186 End stage renal disease: Secondary | ICD-10-CM | POA: Diagnosis not present

## 2016-12-06 DIAGNOSIS — N2581 Secondary hyperparathyroidism of renal origin: Secondary | ICD-10-CM | POA: Diagnosis not present

## 2016-12-06 DIAGNOSIS — T82598A Other mechanical complication of other cardiac and vascular devices and implants, initial encounter: Secondary | ICD-10-CM | POA: Diagnosis not present

## 2016-12-06 DIAGNOSIS — D689 Coagulation defect, unspecified: Secondary | ICD-10-CM | POA: Diagnosis not present

## 2016-12-09 DIAGNOSIS — T82598A Other mechanical complication of other cardiac and vascular devices and implants, initial encounter: Secondary | ICD-10-CM | POA: Diagnosis not present

## 2016-12-09 DIAGNOSIS — D689 Coagulation defect, unspecified: Secondary | ICD-10-CM | POA: Diagnosis not present

## 2016-12-09 DIAGNOSIS — E1029 Type 1 diabetes mellitus with other diabetic kidney complication: Secondary | ICD-10-CM | POA: Diagnosis not present

## 2016-12-09 DIAGNOSIS — N186 End stage renal disease: Secondary | ICD-10-CM | POA: Diagnosis not present

## 2016-12-09 DIAGNOSIS — D631 Anemia in chronic kidney disease: Secondary | ICD-10-CM | POA: Diagnosis not present

## 2016-12-09 DIAGNOSIS — N2581 Secondary hyperparathyroidism of renal origin: Secondary | ICD-10-CM | POA: Diagnosis not present

## 2016-12-10 ENCOUNTER — Encounter: Payer: Self-pay | Admitting: Occupational Therapy

## 2016-12-10 ENCOUNTER — Ambulatory Visit: Payer: Medicare Other | Admitting: Occupational Therapy

## 2016-12-10 DIAGNOSIS — M6281 Muscle weakness (generalized): Secondary | ICD-10-CM

## 2016-12-10 DIAGNOSIS — R278 Other lack of coordination: Secondary | ICD-10-CM | POA: Diagnosis not present

## 2016-12-10 DIAGNOSIS — M25641 Stiffness of right hand, not elsewhere classified: Secondary | ICD-10-CM | POA: Diagnosis not present

## 2016-12-10 DIAGNOSIS — R2681 Unsteadiness on feet: Secondary | ICD-10-CM | POA: Diagnosis not present

## 2016-12-10 DIAGNOSIS — R2689 Other abnormalities of gait and mobility: Secondary | ICD-10-CM | POA: Diagnosis not present

## 2016-12-10 DIAGNOSIS — I96 Gangrene, not elsewhere classified: Secondary | ICD-10-CM | POA: Diagnosis not present

## 2016-12-11 DIAGNOSIS — N186 End stage renal disease: Secondary | ICD-10-CM | POA: Diagnosis not present

## 2016-12-11 DIAGNOSIS — E1029 Type 1 diabetes mellitus with other diabetic kidney complication: Secondary | ICD-10-CM | POA: Diagnosis not present

## 2016-12-11 DIAGNOSIS — D631 Anemia in chronic kidney disease: Secondary | ICD-10-CM | POA: Diagnosis not present

## 2016-12-11 DIAGNOSIS — N2581 Secondary hyperparathyroidism of renal origin: Secondary | ICD-10-CM | POA: Diagnosis not present

## 2016-12-11 DIAGNOSIS — T82598A Other mechanical complication of other cardiac and vascular devices and implants, initial encounter: Secondary | ICD-10-CM | POA: Diagnosis not present

## 2016-12-11 DIAGNOSIS — D689 Coagulation defect, unspecified: Secondary | ICD-10-CM | POA: Diagnosis not present

## 2016-12-11 NOTE — Therapy (Addendum)
[facility] [address] [place], [place], [address] Phone: [phone]   Fax:  [phone]  Occupational Therapy Treatment  Patient Details  Name: Jack [name] MRN: [id number] Date of Birth: 31-Dec-1945 Referring Provider: Dr. Leanora Cover   Encounter Date: 12/10/2016  OT End of Session - 12/10/16 1009    Visit Number  13    Number of Visits  20    Date for OT Re-Evaluation  01/27/17    Authorization - Visit Number  45    Authorization - Number of Visits  71    OT Start Time  4656 d/c visit, pt requested to leave early    OT Stop Time  0950    OT Time Calculation (min)  15 min       Past Medical History:  Diagnosis Date  . Anemia   . Asthma   . Contracture of joint    left knee  . Decubitus ulcer of sacral region, stage 2 03/07/2014  . Diabetes mellitus with peripheral vascular disease ([place])   . End-stage renal disease on hemodialysis [facility])    Started HD March 2014.  Cause of ESRD was DM.  Gets HD at [facility] on [place] on MWF schedule.   Marland Kitchen ESRD on hemodialysis ([place])   . Gangrene ([place])    right BKA  . Gangrene of foot ([place])   . Glaucoma   . Glaucoma 03/07/2014  . Heart murmur   . History of MRSA infection 04/22/2013   Bacteremia assoc w L foot wound infection Mar 2015   . Hyperparathyroidism, secondary renal ([place])   . Hypertension   . Hypothyroidism   . MRSA bacteremia   . Multiple myeloma, without mention of having achieved remission 03/30/2012   Cytogenetic neg on 03/23/2012.  Marland Kitchen Peripheral arterial disease ([place])   . Peripheral vascular disease, unspecified ([place]) 11/19/2012   In the past had R foot toe amps then R TMA. In 2015 had left foot toe amp > then TMA >then L BKA on 05/14/13   . Pneumonia    2012  . Thyroid disease    hyperparathyroidism    Past Surgical History:  Procedure Laterality Date  . ABDOMINAL AORTAGRAM Bilateral 11/06/2012   Procedure: ABDOMINAL AORTAGRAM;  Surgeon:  Elam Dutch, MD;  Location: [facility] CATH LAB;  Service: Cardiovascular;  Laterality: Bilateral;  . AMPUTATION Right 11/10/2012   Procedure: AMPUTATION FIRST and SECOND TOES Right Foot;  Surgeon: Elam Dutch, MD;  Location: [place];  Service: Vascular;  Laterality: Right;  . AMPUTATION Left 04/07/2013   Procedure: AMPUTATION DIGIT- LEFT 1ST TOE;  Surgeon: Mal Misty, MD;  Location: [place];  Service: Vascular;  Laterality: Left;  . AMPUTATION Left 04/26/2013   Procedure: Left Foot Transmetatarsal Amputation;  Surgeon: Newt Minion, MD;  Location: [place];  Service: Orthopedics;  Laterality: Left;  . AMPUTATION Left 05/14/2013   Procedure: AMPUTATION BELOW KNEE;  Surgeon: Newt Minion, MD;  Location: [place];  Service: Orthopedics;  Laterality: Left;  Left Below Knee Amputation  . AMPUTATION Left 07/23/2013   Procedure: AMPUTATION BELOW KNEE;  Surgeon: Newt Minion, MD;  Location: [place];  Service: Orthopedics;  Laterality: Left;  Left Below Knee Amputation Revision  . AMPUTATION Right 02/18/2014   Procedure: AMPUTATION BELOW KNEE;  Surgeon: Newt Minion, MD;  Location: [place];  Service: Orthopedics;  Laterality: Right;  . AMPUTATION Right 08/20/2016   Procedure: RIGHT LONG FINGER AMPUTATION;  Surgeon:  Leanora Cover, MD;  Location: [place];  Service: Orthopedics;  Laterality: Right;  . AV FISTULA PLACEMENT Left 03/25/2012   Procedure: ARTERIOVENOUS (AV) FISTULA CREATION;  Surgeon: Mal Misty, MD;  Location: [place];  Service: Vascular;  Laterality: Left;  . BELOW KNEE LEG AMPUTATION Left 05/14/2013   DR [name]  . BELOW KNEE LEG AMPUTATION Right 02/18/2014   dr duda  . CARDIAC CATHETERIZATION     approx 30 years ago  . CERVICAL DISC SURGERY    . EYE SURGERY Bilateral    CATARACTS  . I&D EXTREMITY Left 04/22/2013   Procedure: IRRIGATION AND DEBRIDEMENT LEFT FIRST TOE AMPUTATION WOUND ;  Surgeon: Mal Misty, MD;  Location: [place];  Service: Vascular;  Laterality: Left;  . I&D  EXTREMITY Right 01/05/2014   Procedure: IRRIGATION AND DEBRIDEMENT Right Heel Ulcer;  Surgeon: Mcarthur Rossetti, MD;  Location: [place];  Service: Orthopedics;  Laterality: Right;  Surgeon Available after 5PM  . INSERTION OF DIALYSIS CATHETER Right 03/19/2012   Procedure: INSERTION OF DIALYSIS CATHETER;  Surgeon: Mal Misty, MD;  Location: [place];  Service: Vascular;  Laterality: Right;  Right Internal Jugular  . LIGATION OF COMPETING BRANCHES OF ARTERIOVENOUS FISTULA Left 05/08/2012   Procedure: LIGATION OF COMPETING BRANCHES OF ARTERIOVENOUS FISTULA;  Surgeon: Mal Misty, MD;  Location: [place];  Service: Vascular;  Laterality: Left;  Ultrasound guided  . REPAIR QUADRICEPS/HAMSTRING MUSCLES Left 05/20/2014   Procedure: Left Hamstring Release;  Surgeon: Newt Minion, MD;  Location: [place];  Service: Orthopedics;  Laterality: Left;  . REVISON OF ARTERIOVENOUS FISTULA Left 01/30/2016   Procedure: CEPHALIC VEIN TURNDOWN TO LEFT UPPER ARM;  Surgeon: Elam Dutch, MD;  Location: [place];  Service: Vascular;  Laterality: Left;  . STUMP REVISION Right 05/20/2014   Procedure: Revision Right Below Knee Amputation;  Surgeon: Newt Minion, MD;  Location: [place];  Service: Orthopedics;  Laterality: Right;  . TEE WITHOUT CARDIOVERSION N/A 04/20/2013   Procedure: TRANSESOPHAGEAL ECHOCARDIOGRAM (TEE);  Surgeon: Josue Hector, MD;  Location: [facility] ENDOSCOPY;  Service: Cardiovascular;  Laterality: N/A;  . THYROIDECTOMY    . TOE AMPUTATION     D/C 04-30-13  . TRANSMETATARSAL AMPUTATION Left 12/16/2012   Procedure: TRANSMETATARSAL AMPUTATION AND VAC PLACEMENT;  Surgeon: Elam Dutch, MD;  Location: [place];  Service: Vascular;  Laterality: Left;    There were no vitals filed for this visit.  Subjective Assessment - 12/10/16 1008    Subjective   Pt reports that MD told him to wrap up therapy for now    Pertinent History  recent right middle finger tip amputation through the middle phalanx, bilateral transtibial  amputations, anemia, asthma, contracture of L knee, decubitus ulcer of sacral region, DM with PVD, ESRD, glaucoma, heart murmur, HTN, hypothyroidism, multiple myeloma, PAD    Patient Stated Goals  improve ROM and function in right hand    Currently in Pain?  Yes    Pain Score  4     Pain Location  Hand    Pain Orientation  Right    Pain Type  Acute pain    Pain Onset  More than a month ago    Pain Frequency  Intermittent    Aggravating Factors   movement    Pain Relieving Factors  inactivity    Effect of Pain on Daily Activities  difficulty using right hand  OT Education - 12/10/16 1322    Education provided  Yes    Education Details  continued A/ROM exercises, importance of calling MD with any s/s of infection , d/c from OT until finger is healled per MD recommendation    Person(s) Educated  Patient;Child(ren)    Methods  Explanation;Demonstration;Verbal cues;Handout    Comprehension  Verbalized understanding;Returned demonstration       OT Short Term Goals - 12/10/16 0946      OT SHORT TERM GOAL #1   Title  I with inital HEP    Status  Achieved      OT SHORT TERM GOAL #2   Title  Pt / caregiver will verbalize understanding of daily dressing changes, wound care and s/s of infection    Status  Achieved      OT SHORT TERM GOAL #3   Title  Pt/ caregiver will verbalize understanding of AE/ DME in order to maximize pt safety and indepenence with ADLs/ IADLs.    Status  Achieved      OT SHORT TERM GOAL #4   Title  Pt will resume performance of all basic ADLs at a modified independent level. due 12/27/16    Status  Not Met 90%      OT SHORT TERM GOAL #5   Title  Pt will demonstrate adequate RUE strength in order to donn prosthesis/ liners independently. due 12/27/16    Time  4    Period  Weeks    Status  Not Met        OT Long Term Goals - 12/10/16 0946      OT LONG TERM GOAL #1   Title  I with updated HEP due 01/26/17     Time  8    Period  Weeks    Status  Not Met not cleared for strengthening      OT LONG TERM GOAL #2   Title  Pt will report using RUE consistently for ADLS at least 16%X for ADLs/ IADLs with pain less than or equal to 2/10.    Time  8    Period  Weeks    Status  Not Met pain up to 6/10, uses 75% of the time      OT LONG TERM GOAL #3   Title  Pt will demonstrate RUE grip strength of at least 30 lbs for increased Functional use during ADLs/ IADLs.    Status  Not Met not cleared for strengthening      OT LONG TERM GOAL #4   Title  Pt will resume performance of light home management/ cooking at a modified independent level- due 01/26/17    Status  Not Met not performing consistenly            Plan - 12/10/16 1324    Clinical Impression Statement  Pt is progressing slowly due to slow wound healing. Pt saw MD this a.m . and he was instructed to d/c from therapy until his finger is healed. Pt/ daughter are in agreement.    Rehab Potential  Good    Current Impairments/barriers affecting progress:  pain    OT Frequency  1x / week    OT Duration  8 weeks    OT Treatment/Interventions  Self-care/ADL training;Moist Heat;Fluidtherapy;DME and/or AE instruction;Splinting;Patient/family education;Therapeutic exercises;Ultrasound;Therapeutic exercise;Therapeutic activities;Cognitive remediation/compensation;Passive range of motion;Functional Mobility Training;Neuromuscular education;Iontophoresis;Cryotherapy;Electrical Stimulation;Energy conservation;Manual Therapy    Plan  d/c from OT    OT Home Exercise Plan  A/ROM HEP  Consulted and Agree with Plan of Care  Patient;Family member/caregiver       Patient will benefit from skilled therapeutic intervention in order to improve the following deficits and impairments:  Decreased range of motion, Decreased coordination, Pain, Impaired UE functional use, Decreased knowledge of use of DME, Decreased balance, Decreased mobility, Decreased  strength  Visit Diagnosis: Stiffness of joint, hand, right  Muscle weakness (generalized)  Other lack of coordination  G-Codes - 06-Jan-2017 1311    Functional Assessment Tool Used (Outpatient only)  clininical impressions    Functional Limitation  Carrying, moving and handling objects    Carrying, Moving and Handling Objects Goal Status (B1478)  At least 20 percent but less than 40 percent impaired, limited or restricted    Carrying, Moving and Handling Objects Discharge Status [phone])  At least 20 percent but less than 40 percent impaired, limited or restricted      OCCUPATIONAL THERAPY DISCHARGE SUMMARY    Current functional level related to goals / functional outcomes: See above   Remaining deficits: Pain, non-healing finger tip wound, decreased ROM and strength   Education / Equipment: Pt was educated regarding dressing changes, HEP and importance of calling MD with s/s of infection. Pt verbalized understanding. Pt reports that his MD told him to stop therapy at this time.  Plan: Patient agrees to discharge.  Patient goals were partially met. Patient is being discharged due to the physician's request.  ?????     Problem List Patient Active Problem List   Diagnosis Date Noted  . Dyslipidemia 11/21/2015  . Diabetic retinopathy ([place])- left eye 10/02/2015  . Complications, amputation stump late ([place]) 05/20/2014  . Weakness generalized 04/07/2014  . Sepsis ([place]) 04/07/2014  . Decubitus ulcer, stage II 04/07/2014  . Decubitus ulcer of ankle   . Fatigue   . Wound infection 04/06/2014  . Decubitus ulcer of sacral region, stage 2 03/07/2014  . Glaucoma 03/07/2014  . Gout 03/07/2014  . Below knee amputation status ([place]) 02/18/2014  . Osteomyelitis ([place]) 01/04/2014  . Phantom limb ([place]) 12/14/2013  . Type 2 diabetes mellitus with diabetic foot infection ([place])   . Diabetes mellitus with peripheral vascular disease ([place])   . Asthma 05/17/2013  . S/P bilateral below knee  amputation ([place]) 05/17/2013  . History of MRSA infection 04/22/2013  . Peripheral vascular disease ([place]) 11/19/2012  . End-stage renal disease on hemodialysis ([place]) 05/05/2012  . Kahler disease ([place]) 03/30/2012  . Anemia in chronic kidney disease 03/19/2012  . Hypothyroidism following radioiodine therapy 03/19/2012  . Anemia, iron deficiency 03/19/2012  . Hypertension 03/18/2012  . Chronic kidney disease (CKD), stage V ([place]) 03/18/2012  . Cardiac conduction disorder 03/18/2012  . MGUS (monoclonal gammopathy of unknown significance) 02/28/2011  . Monoclonal paraproteinemia 02/28/2011    Yousaf Sainato 01/06/17, 4:38 PM Theone Murdoch, OTR/L [phone] Phone: [phone] 4:17 PM 01/24/17 [facility] [address] [place], [place], [address] Phone: [phone]   Fax:  [phone]  Name: Jack [name] MRN: [id number] Date of Birth: [birth year]

## 2016-12-13 ENCOUNTER — Other Ambulatory Visit: Payer: Self-pay | Admitting: Family Medicine

## 2016-12-13 DIAGNOSIS — C9000 Multiple myeloma not having achieved remission: Secondary | ICD-10-CM | POA: Diagnosis not present

## 2016-12-13 DIAGNOSIS — E890 Postprocedural hypothyroidism: Secondary | ICD-10-CM

## 2016-12-13 DIAGNOSIS — N186 End stage renal disease: Secondary | ICD-10-CM | POA: Diagnosis not present

## 2016-12-13 DIAGNOSIS — T82598A Other mechanical complication of other cardiac and vascular devices and implants, initial encounter: Secondary | ICD-10-CM | POA: Diagnosis not present

## 2016-12-13 DIAGNOSIS — Z992 Dependence on renal dialysis: Secondary | ICD-10-CM | POA: Diagnosis not present

## 2016-12-13 DIAGNOSIS — D631 Anemia in chronic kidney disease: Secondary | ICD-10-CM | POA: Diagnosis not present

## 2016-12-13 DIAGNOSIS — E1029 Type 1 diabetes mellitus with other diabetic kidney complication: Secondary | ICD-10-CM | POA: Diagnosis not present

## 2016-12-13 DIAGNOSIS — N2581 Secondary hyperparathyroidism of renal origin: Secondary | ICD-10-CM | POA: Diagnosis not present

## 2016-12-13 DIAGNOSIS — D689 Coagulation defect, unspecified: Secondary | ICD-10-CM | POA: Diagnosis not present

## 2016-12-16 DIAGNOSIS — D631 Anemia in chronic kidney disease: Secondary | ICD-10-CM | POA: Diagnosis not present

## 2016-12-16 DIAGNOSIS — N186 End stage renal disease: Secondary | ICD-10-CM | POA: Diagnosis not present

## 2016-12-16 DIAGNOSIS — D689 Coagulation defect, unspecified: Secondary | ICD-10-CM | POA: Diagnosis not present

## 2016-12-16 DIAGNOSIS — R509 Fever, unspecified: Secondary | ICD-10-CM | POA: Diagnosis not present

## 2016-12-16 DIAGNOSIS — E1029 Type 1 diabetes mellitus with other diabetic kidney complication: Secondary | ICD-10-CM | POA: Diagnosis not present

## 2016-12-16 DIAGNOSIS — T82598A Other mechanical complication of other cardiac and vascular devices and implants, initial encounter: Secondary | ICD-10-CM | POA: Diagnosis not present

## 2016-12-16 DIAGNOSIS — N2581 Secondary hyperparathyroidism of renal origin: Secondary | ICD-10-CM | POA: Diagnosis not present

## 2016-12-18 DIAGNOSIS — N2581 Secondary hyperparathyroidism of renal origin: Secondary | ICD-10-CM | POA: Diagnosis not present

## 2016-12-18 DIAGNOSIS — E1029 Type 1 diabetes mellitus with other diabetic kidney complication: Secondary | ICD-10-CM | POA: Diagnosis not present

## 2016-12-18 DIAGNOSIS — D689 Coagulation defect, unspecified: Secondary | ICD-10-CM | POA: Diagnosis not present

## 2016-12-18 DIAGNOSIS — T82598A Other mechanical complication of other cardiac and vascular devices and implants, initial encounter: Secondary | ICD-10-CM | POA: Diagnosis not present

## 2016-12-18 DIAGNOSIS — D631 Anemia in chronic kidney disease: Secondary | ICD-10-CM | POA: Diagnosis not present

## 2016-12-18 DIAGNOSIS — R509 Fever, unspecified: Secondary | ICD-10-CM | POA: Diagnosis not present

## 2016-12-18 DIAGNOSIS — N186 End stage renal disease: Secondary | ICD-10-CM | POA: Diagnosis not present

## 2016-12-20 DIAGNOSIS — E1029 Type 1 diabetes mellitus with other diabetic kidney complication: Secondary | ICD-10-CM | POA: Diagnosis not present

## 2016-12-20 DIAGNOSIS — N2581 Secondary hyperparathyroidism of renal origin: Secondary | ICD-10-CM | POA: Diagnosis not present

## 2016-12-20 DIAGNOSIS — D631 Anemia in chronic kidney disease: Secondary | ICD-10-CM | POA: Diagnosis not present

## 2016-12-20 DIAGNOSIS — R509 Fever, unspecified: Secondary | ICD-10-CM | POA: Diagnosis not present

## 2016-12-20 DIAGNOSIS — N186 End stage renal disease: Secondary | ICD-10-CM | POA: Diagnosis not present

## 2016-12-20 DIAGNOSIS — D689 Coagulation defect, unspecified: Secondary | ICD-10-CM | POA: Diagnosis not present

## 2016-12-20 DIAGNOSIS — T82598A Other mechanical complication of other cardiac and vascular devices and implants, initial encounter: Secondary | ICD-10-CM | POA: Diagnosis not present

## 2016-12-24 ENCOUNTER — Encounter: Payer: Medicare Other | Admitting: Occupational Therapy

## 2016-12-25 DIAGNOSIS — N186 End stage renal disease: Secondary | ICD-10-CM | POA: Diagnosis not present

## 2016-12-25 DIAGNOSIS — D689 Coagulation defect, unspecified: Secondary | ICD-10-CM | POA: Diagnosis not present

## 2016-12-25 DIAGNOSIS — R509 Fever, unspecified: Secondary | ICD-10-CM | POA: Diagnosis not present

## 2016-12-25 DIAGNOSIS — N2581 Secondary hyperparathyroidism of renal origin: Secondary | ICD-10-CM | POA: Diagnosis not present

## 2016-12-25 DIAGNOSIS — D631 Anemia in chronic kidney disease: Secondary | ICD-10-CM | POA: Diagnosis not present

## 2016-12-25 DIAGNOSIS — E1029 Type 1 diabetes mellitus with other diabetic kidney complication: Secondary | ICD-10-CM | POA: Diagnosis not present

## 2016-12-25 DIAGNOSIS — T82598A Other mechanical complication of other cardiac and vascular devices and implants, initial encounter: Secondary | ICD-10-CM | POA: Diagnosis not present

## 2016-12-27 DIAGNOSIS — D631 Anemia in chronic kidney disease: Secondary | ICD-10-CM | POA: Diagnosis not present

## 2016-12-27 DIAGNOSIS — E1029 Type 1 diabetes mellitus with other diabetic kidney complication: Secondary | ICD-10-CM | POA: Diagnosis not present

## 2016-12-27 DIAGNOSIS — N186 End stage renal disease: Secondary | ICD-10-CM | POA: Diagnosis not present

## 2016-12-27 DIAGNOSIS — T82598A Other mechanical complication of other cardiac and vascular devices and implants, initial encounter: Secondary | ICD-10-CM | POA: Diagnosis not present

## 2016-12-27 DIAGNOSIS — D689 Coagulation defect, unspecified: Secondary | ICD-10-CM | POA: Diagnosis not present

## 2016-12-27 DIAGNOSIS — R509 Fever, unspecified: Secondary | ICD-10-CM | POA: Diagnosis not present

## 2016-12-27 DIAGNOSIS — N2581 Secondary hyperparathyroidism of renal origin: Secondary | ICD-10-CM | POA: Diagnosis not present

## 2016-12-30 DIAGNOSIS — T82598A Other mechanical complication of other cardiac and vascular devices and implants, initial encounter: Secondary | ICD-10-CM | POA: Diagnosis not present

## 2016-12-30 DIAGNOSIS — D689 Coagulation defect, unspecified: Secondary | ICD-10-CM | POA: Diagnosis not present

## 2016-12-30 DIAGNOSIS — E1029 Type 1 diabetes mellitus with other diabetic kidney complication: Secondary | ICD-10-CM | POA: Diagnosis not present

## 2016-12-30 DIAGNOSIS — N2581 Secondary hyperparathyroidism of renal origin: Secondary | ICD-10-CM | POA: Diagnosis not present

## 2016-12-30 DIAGNOSIS — N186 End stage renal disease: Secondary | ICD-10-CM | POA: Diagnosis not present

## 2016-12-30 DIAGNOSIS — D631 Anemia in chronic kidney disease: Secondary | ICD-10-CM | POA: Diagnosis not present

## 2016-12-30 DIAGNOSIS — R509 Fever, unspecified: Secondary | ICD-10-CM | POA: Diagnosis not present

## 2016-12-31 DIAGNOSIS — I96 Gangrene, not elsewhere classified: Secondary | ICD-10-CM | POA: Diagnosis not present

## 2017-01-01 DIAGNOSIS — D689 Coagulation defect, unspecified: Secondary | ICD-10-CM | POA: Diagnosis not present

## 2017-01-01 DIAGNOSIS — N2581 Secondary hyperparathyroidism of renal origin: Secondary | ICD-10-CM | POA: Diagnosis not present

## 2017-01-01 DIAGNOSIS — E1029 Type 1 diabetes mellitus with other diabetic kidney complication: Secondary | ICD-10-CM | POA: Diagnosis not present

## 2017-01-01 DIAGNOSIS — N186 End stage renal disease: Secondary | ICD-10-CM | POA: Diagnosis not present

## 2017-01-01 DIAGNOSIS — T82598A Other mechanical complication of other cardiac and vascular devices and implants, initial encounter: Secondary | ICD-10-CM | POA: Diagnosis not present

## 2017-01-01 DIAGNOSIS — R509 Fever, unspecified: Secondary | ICD-10-CM | POA: Diagnosis not present

## 2017-01-01 DIAGNOSIS — D631 Anemia in chronic kidney disease: Secondary | ICD-10-CM | POA: Diagnosis not present

## 2017-01-02 DIAGNOSIS — I96 Gangrene, not elsewhere classified: Secondary | ICD-10-CM | POA: Diagnosis not present

## 2017-01-02 DIAGNOSIS — M79644 Pain in right finger(s): Secondary | ICD-10-CM | POA: Diagnosis not present

## 2017-01-03 DIAGNOSIS — R509 Fever, unspecified: Secondary | ICD-10-CM | POA: Diagnosis not present

## 2017-01-03 DIAGNOSIS — D689 Coagulation defect, unspecified: Secondary | ICD-10-CM | POA: Diagnosis not present

## 2017-01-03 DIAGNOSIS — N2581 Secondary hyperparathyroidism of renal origin: Secondary | ICD-10-CM | POA: Diagnosis not present

## 2017-01-03 DIAGNOSIS — N186 End stage renal disease: Secondary | ICD-10-CM | POA: Diagnosis not present

## 2017-01-03 DIAGNOSIS — D631 Anemia in chronic kidney disease: Secondary | ICD-10-CM | POA: Diagnosis not present

## 2017-01-03 DIAGNOSIS — E1029 Type 1 diabetes mellitus with other diabetic kidney complication: Secondary | ICD-10-CM | POA: Diagnosis not present

## 2017-01-03 DIAGNOSIS — T82598A Other mechanical complication of other cardiac and vascular devices and implants, initial encounter: Secondary | ICD-10-CM | POA: Diagnosis not present

## 2017-01-05 DIAGNOSIS — T82598A Other mechanical complication of other cardiac and vascular devices and implants, initial encounter: Secondary | ICD-10-CM | POA: Diagnosis not present

## 2017-01-05 DIAGNOSIS — D631 Anemia in chronic kidney disease: Secondary | ICD-10-CM | POA: Diagnosis not present

## 2017-01-05 DIAGNOSIS — E1029 Type 1 diabetes mellitus with other diabetic kidney complication: Secondary | ICD-10-CM | POA: Diagnosis not present

## 2017-01-05 DIAGNOSIS — N2581 Secondary hyperparathyroidism of renal origin: Secondary | ICD-10-CM | POA: Diagnosis not present

## 2017-01-05 DIAGNOSIS — D689 Coagulation defect, unspecified: Secondary | ICD-10-CM | POA: Diagnosis not present

## 2017-01-05 DIAGNOSIS — R509 Fever, unspecified: Secondary | ICD-10-CM | POA: Diagnosis not present

## 2017-01-05 DIAGNOSIS — N186 End stage renal disease: Secondary | ICD-10-CM | POA: Diagnosis not present

## 2017-01-08 DIAGNOSIS — N2581 Secondary hyperparathyroidism of renal origin: Secondary | ICD-10-CM | POA: Diagnosis not present

## 2017-01-08 DIAGNOSIS — D689 Coagulation defect, unspecified: Secondary | ICD-10-CM | POA: Diagnosis not present

## 2017-01-08 DIAGNOSIS — D631 Anemia in chronic kidney disease: Secondary | ICD-10-CM | POA: Diagnosis not present

## 2017-01-08 DIAGNOSIS — T82598A Other mechanical complication of other cardiac and vascular devices and implants, initial encounter: Secondary | ICD-10-CM | POA: Diagnosis not present

## 2017-01-08 DIAGNOSIS — N186 End stage renal disease: Secondary | ICD-10-CM | POA: Diagnosis not present

## 2017-01-08 DIAGNOSIS — R509 Fever, unspecified: Secondary | ICD-10-CM | POA: Diagnosis not present

## 2017-01-08 DIAGNOSIS — E1029 Type 1 diabetes mellitus with other diabetic kidney complication: Secondary | ICD-10-CM | POA: Diagnosis not present

## 2017-01-09 ENCOUNTER — Encounter: Payer: Medicare Other | Admitting: Occupational Therapy

## 2017-01-10 ENCOUNTER — Other Ambulatory Visit: Payer: Self-pay | Admitting: Orthopedic Surgery

## 2017-01-10 ENCOUNTER — Encounter (HOSPITAL_COMMUNITY): Payer: Self-pay | Admitting: *Deleted

## 2017-01-10 ENCOUNTER — Other Ambulatory Visit: Payer: Self-pay

## 2017-01-10 DIAGNOSIS — S41102D Unspecified open wound of left upper arm, subsequent encounter: Secondary | ICD-10-CM | POA: Diagnosis not present

## 2017-01-10 DIAGNOSIS — R509 Fever, unspecified: Secondary | ICD-10-CM | POA: Diagnosis not present

## 2017-01-10 DIAGNOSIS — I96 Gangrene, not elsewhere classified: Secondary | ICD-10-CM | POA: Diagnosis not present

## 2017-01-10 DIAGNOSIS — D689 Coagulation defect, unspecified: Secondary | ICD-10-CM | POA: Diagnosis not present

## 2017-01-10 DIAGNOSIS — N186 End stage renal disease: Secondary | ICD-10-CM | POA: Diagnosis not present

## 2017-01-10 DIAGNOSIS — D631 Anemia in chronic kidney disease: Secondary | ICD-10-CM | POA: Diagnosis not present

## 2017-01-10 DIAGNOSIS — E1029 Type 1 diabetes mellitus with other diabetic kidney complication: Secondary | ICD-10-CM | POA: Diagnosis not present

## 2017-01-10 DIAGNOSIS — T82598A Other mechanical complication of other cardiac and vascular devices and implants, initial encounter: Secondary | ICD-10-CM | POA: Diagnosis not present

## 2017-01-10 DIAGNOSIS — N2581 Secondary hyperparathyroidism of renal origin: Secondary | ICD-10-CM | POA: Diagnosis not present

## 2017-01-10 NOTE — Progress Notes (Signed)
Pt denies any acute cardiopulmonary issues. Pt under the care of a cardiologist but is unsure of MD's name. Pt stated that a stress test was performed 10 years ago and a cardiac cath 30 years ago. Pt denies having a chest x ray within the la st year. Pt made aware to stop taking otc vitamins, fish oil and herbal medications. Do not take any NSAIDs ie: Ibuprofen, Advil, Naproxen (Aleve), Motrin, BC and Goody Powder. Pt made aware to not take Glipizide the DOS. Pt made aware to check BG every 2 hours prior to arrival to hospital on DOS. Pt made aware to treat a BG < 70 with 4 ounces of apple juice, wait 15 minutes after intervention to recheck BG, if BG remains < 70, call Short Stay unit to speak with a nurse. Pt verbalized understanding of all pre-op instructions.

## 2017-01-11 ENCOUNTER — Ambulatory Visit (HOSPITAL_COMMUNITY): Payer: Medicare Other | Admitting: Certified Registered"

## 2017-01-11 ENCOUNTER — Ambulatory Visit (HOSPITAL_COMMUNITY)
Admission: RE | Admit: 2017-01-11 | Discharge: 2017-01-11 | Disposition: A | Discharge status: Home / Self Care | Payer: Medicare Other | Source: Ambulatory Visit | Attending: Orthopedic Surgery | Admitting: Orthopedic Surgery

## 2017-01-11 ENCOUNTER — Encounter (HOSPITAL_COMMUNITY): Payer: Self-pay | Admitting: *Deleted

## 2017-01-11 ENCOUNTER — Encounter (HOSPITAL_COMMUNITY)
Admission: RE | Disposition: A | Discharge status: Home / Self Care | Payer: Self-pay | Source: Ambulatory Visit | Attending: Orthopedic Surgery

## 2017-01-11 DIAGNOSIS — Z89421 Acquired absence of other right toe(s): Secondary | ICD-10-CM | POA: Diagnosis not present

## 2017-01-11 DIAGNOSIS — Z9841 Cataract extraction status, right eye: Secondary | ICD-10-CM | POA: Diagnosis not present

## 2017-01-11 DIAGNOSIS — X58XXXA Exposure to other specified factors, initial encounter: Secondary | ICD-10-CM | POA: Diagnosis not present

## 2017-01-11 DIAGNOSIS — C9000 Multiple myeloma not having achieved remission: Secondary | ICD-10-CM | POA: Insufficient documentation

## 2017-01-11 DIAGNOSIS — D631 Anemia in chronic kidney disease: Secondary | ICD-10-CM | POA: Insufficient documentation

## 2017-01-11 DIAGNOSIS — I120 Hypertensive chronic kidney disease with stage 5 chronic kidney disease or end stage renal disease: Secondary | ICD-10-CM | POA: Insufficient documentation

## 2017-01-11 DIAGNOSIS — N186 End stage renal disease: Secondary | ICD-10-CM | POA: Insufficient documentation

## 2017-01-11 DIAGNOSIS — Z833 Family history of diabetes mellitus: Secondary | ICD-10-CM | POA: Insufficient documentation

## 2017-01-11 DIAGNOSIS — H409 Unspecified glaucoma: Secondary | ICD-10-CM | POA: Diagnosis not present

## 2017-01-11 DIAGNOSIS — Z808 Family history of malignant neoplasm of other organs or systems: Secondary | ICD-10-CM | POA: Insufficient documentation

## 2017-01-11 DIAGNOSIS — Z841 Family history of disorders of kidney and ureter: Secondary | ICD-10-CM | POA: Insufficient documentation

## 2017-01-11 DIAGNOSIS — N2581 Secondary hyperparathyroidism of renal origin: Secondary | ICD-10-CM | POA: Insufficient documentation

## 2017-01-11 DIAGNOSIS — Z9842 Cataract extraction status, left eye: Secondary | ICD-10-CM | POA: Insufficient documentation

## 2017-01-11 DIAGNOSIS — M1990 Unspecified osteoarthritis, unspecified site: Secondary | ICD-10-CM | POA: Insufficient documentation

## 2017-01-11 DIAGNOSIS — T8751 Necrosis of amputation stump, right upper extremity: Secondary | ICD-10-CM | POA: Insufficient documentation

## 2017-01-11 DIAGNOSIS — E1151 Type 2 diabetes mellitus with diabetic peripheral angiopathy without gangrene: Secondary | ICD-10-CM | POA: Diagnosis not present

## 2017-01-11 DIAGNOSIS — Z89411 Acquired absence of right great toe: Secondary | ICD-10-CM | POA: Insufficient documentation

## 2017-01-11 DIAGNOSIS — Z8614 Personal history of Methicillin resistant Staphylococcus aureus infection: Secondary | ICD-10-CM | POA: Diagnosis not present

## 2017-01-11 DIAGNOSIS — J45909 Unspecified asthma, uncomplicated: Secondary | ICD-10-CM | POA: Diagnosis not present

## 2017-01-11 DIAGNOSIS — R011 Cardiac murmur, unspecified: Secondary | ICD-10-CM | POA: Diagnosis not present

## 2017-01-11 DIAGNOSIS — T8189XA Other complications of procedures, not elsewhere classified, initial encounter: Secondary | ICD-10-CM | POA: Diagnosis not present

## 2017-01-11 DIAGNOSIS — Z79899 Other long term (current) drug therapy: Secondary | ICD-10-CM | POA: Insufficient documentation

## 2017-01-11 DIAGNOSIS — Z89412 Acquired absence of left great toe: Secondary | ICD-10-CM | POA: Insufficient documentation

## 2017-01-11 DIAGNOSIS — E890 Postprocedural hypothyroidism: Secondary | ICD-10-CM | POA: Insufficient documentation

## 2017-01-11 DIAGNOSIS — Z7982 Long term (current) use of aspirin: Secondary | ICD-10-CM | POA: Insufficient documentation

## 2017-01-11 DIAGNOSIS — Z89512 Acquired absence of left leg below knee: Secondary | ICD-10-CM | POA: Diagnosis not present

## 2017-01-11 DIAGNOSIS — I96 Gangrene, not elsewhere classified: Secondary | ICD-10-CM | POA: Diagnosis not present

## 2017-01-11 DIAGNOSIS — E1122 Type 2 diabetes mellitus with diabetic chronic kidney disease: Secondary | ICD-10-CM | POA: Insufficient documentation

## 2017-01-11 DIAGNOSIS — Z992 Dependence on renal dialysis: Secondary | ICD-10-CM | POA: Diagnosis not present

## 2017-01-11 DIAGNOSIS — Z89511 Acquired absence of right leg below knee: Secondary | ICD-10-CM | POA: Insufficient documentation

## 2017-01-11 DIAGNOSIS — Z87891 Personal history of nicotine dependence: Secondary | ICD-10-CM | POA: Insufficient documentation

## 2017-01-11 DIAGNOSIS — Z89021 Acquired absence of right finger(s): Secondary | ICD-10-CM | POA: Diagnosis not present

## 2017-01-11 DIAGNOSIS — T8750 Necrosis of amputation stump, unspecified extremity: Secondary | ICD-10-CM | POA: Diagnosis not present

## 2017-01-11 DIAGNOSIS — Z7984 Long term (current) use of oral hypoglycemic drugs: Secondary | ICD-10-CM | POA: Insufficient documentation

## 2017-01-11 HISTORY — DX: Unspecified osteoarthritis, unspecified site: M19.90

## 2017-01-11 HISTORY — PX: AMPUTATION: SHX166

## 2017-01-11 LAB — POCT I-STAT 4, (NA,K, GLUC, HGB,HCT)
GLUCOSE: 92 mg/dL (ref 65–99)
HEMATOCRIT: 35 % — AB (ref 39.0–52.0)
HEMOGLOBIN: 11.9 g/dL — AB (ref 13.0–17.0)
Potassium: 3.9 mmol/L (ref 3.5–5.1)
Sodium: 140 mmol/L (ref 135–145)

## 2017-01-11 LAB — GLUCOSE, CAPILLARY: Glucose-Capillary: 82 mg/dL (ref 65–99)

## 2017-01-11 SURGERY — AMPUTATION DIGIT
Anesthesia: General | Site: Finger | Laterality: Right

## 2017-01-11 MED ORDER — LIDOCAINE HCL (CARDIAC) 20 MG/ML IV SOLN
INTRAVENOUS | Status: DC | PRN
  Administered 2017-01-11: 40 mg via INTRAVENOUS

## 2017-01-11 MED ORDER — CHLORHEXIDINE GLUCONATE 4 % EX LIQD: 60.0000 mL | Freq: Once | CUTANEOUS | Status: DC

## 2017-01-11 MED ORDER — OXYCODONE HCL 5 MG PO TABS
ORAL_TABLET | ORAL | Status: AC
  Filled 2017-01-11: qty 1

## 2017-01-11 MED ORDER — ONDANSETRON HCL 4 MG/2ML IJ SOLN
INTRAMUSCULAR | Status: DC | PRN
  Administered 2017-01-11: 4 mg via INTRAVENOUS

## 2017-01-11 MED ORDER — FENTANYL CITRATE (PF) 250 MCG/5ML IJ SOLN
INTRAMUSCULAR | Status: AC
  Filled 2017-01-11: qty 5

## 2017-01-11 MED ORDER — 0.9 % SODIUM CHLORIDE (POUR BTL) OPTIME
TOPICAL | Status: DC | PRN
  Administered 2017-01-11: 1000 mL

## 2017-01-11 MED ORDER — OXYCODONE HCL 5 MG PO TABS
5.0000 mg | ORAL_TABLET | Freq: Once | ORAL | Status: AC | PRN
  Administered 2017-01-11: 5 mg via ORAL

## 2017-01-11 MED ORDER — CEFAZOLIN SODIUM-DEXTROSE 2-4 GM/100ML-% IV SOLN
INTRAVENOUS | Status: AC
  Filled 2017-01-11: qty 100

## 2017-01-11 MED ORDER — EPHEDRINE SULFATE 50 MG/ML IJ SOLN
INTRAMUSCULAR | Status: DC | PRN
  Administered 2017-01-11: 15 mg via INTRAVENOUS

## 2017-01-11 MED ORDER — FENTANYL CITRATE (PF) 100 MCG/2ML IJ SOLN
INTRAMUSCULAR | Status: DC | PRN
  Administered 2017-01-11: 25 ug via INTRAVENOUS
  Administered 2017-01-11: 50 ug via INTRAVENOUS
  Administered 2017-01-11 (×2): 25 ug via INTRAVENOUS
  Administered 2017-01-11: 50 ug via INTRAVENOUS

## 2017-01-11 MED ORDER — OXYCODONE-ACETAMINOPHEN 5-325 MG PO TABS: ORAL_TABLET | ORAL | 0 refills | Status: DC

## 2017-01-11 MED ORDER — OXYCODONE HCL 5 MG/5ML PO SOLN: 5.0000 mg | Freq: Once | ORAL | Status: AC | PRN

## 2017-01-11 MED ORDER — PROPOFOL 10 MG/ML IV BOLUS
INTRAVENOUS | Status: DC | PRN
  Administered 2017-01-11: 90 mg via INTRAVENOUS
  Administered 2017-01-11: 20 mg via INTRAVENOUS

## 2017-01-11 MED ORDER — PROPOFOL 10 MG/ML IV BOLUS
INTRAVENOUS | Status: AC
  Filled 2017-01-11: qty 20

## 2017-01-11 MED ORDER — BUPIVACAINE HCL (PF) 0.25 % IJ SOLN
INTRAMUSCULAR | Status: AC
  Filled 2017-01-11: qty 30

## 2017-01-11 MED ORDER — FENTANYL CITRATE (PF) 100 MCG/2ML IJ SOLN: 25.0000 ug | INTRAMUSCULAR | Status: DC | PRN

## 2017-01-11 MED ORDER — SODIUM CHLORIDE 0.9 % IV SOLN
INTRAVENOUS | Status: DC
  Administered 2017-01-11 (×2): via INTRAVENOUS

## 2017-01-11 MED ORDER — BUPIVACAINE HCL 0.25 % IJ SOLN
INTRAMUSCULAR | Status: DC | PRN
  Administered 2017-01-11: 7 mL

## 2017-01-11 MED ORDER — CEFAZOLIN SODIUM-DEXTROSE 2-4 GM/100ML-% IV SOLN
2.0000 g | INTRAVENOUS | Status: AC
  Administered 2017-01-11: 2 g via INTRAVENOUS

## 2017-01-11 MED ORDER — LIDOCAINE 2% (20 MG/ML) 5 ML SYRINGE
INTRAMUSCULAR | Status: AC
  Filled 2017-01-11: qty 5

## 2017-01-11 SURGICAL SUPPLY — 37 items
BANDAGE COBAN STERILE 2 (GAUZE/BANDAGES/DRESSINGS) ×3 IMPLANT
BLADE LONG MED 31MMX9MM (MISCELLANEOUS) ×1
BLADE LONG MED 31X9 (MISCELLANEOUS) ×2 IMPLANT
BNDG COHESIVE 1X5 TAN STRL LF (GAUZE/BANDAGES/DRESSINGS) ×3 IMPLANT
BNDG ESMARK 4X9 LF (GAUZE/BANDAGES/DRESSINGS) ×3 IMPLANT
BNDG GAUZE ELAST 4 BULKY (GAUZE/BANDAGES/DRESSINGS) ×3 IMPLANT
CORDS BIPOLAR (ELECTRODE) ×3 IMPLANT
CUFF TOURNIQUET SINGLE 18IN (TOURNIQUET CUFF) ×3 IMPLANT
CUFF TOURNIQUET SINGLE 24IN (TOURNIQUET CUFF) IMPLANT
DURAPREP 26ML APPLICATOR (WOUND CARE) ×3 IMPLANT
GAUZE SPONGE 4X4 12PLY STRL (GAUZE/BANDAGES/DRESSINGS) ×3 IMPLANT
GAUZE XEROFORM 1X8 LF (GAUZE/BANDAGES/DRESSINGS) ×3 IMPLANT
GLOVE BIO SURGEON STRL SZ7.5 (GLOVE) ×3 IMPLANT
GLOVE BIOGEL PI IND STRL 8 (GLOVE) ×1 IMPLANT
GLOVE BIOGEL PI INDICATOR 8 (GLOVE) ×2
GOWN STRL REUS W/ TWL LRG LVL3 (GOWN DISPOSABLE) ×1 IMPLANT
GOWN STRL REUS W/ TWL XL LVL3 (GOWN DISPOSABLE) ×1 IMPLANT
GOWN STRL REUS W/TWL LRG LVL3 (GOWN DISPOSABLE) ×2
GOWN STRL REUS W/TWL XL LVL3 (GOWN DISPOSABLE) ×2
KIT BASIN OR (CUSTOM PROCEDURE TRAY) ×3 IMPLANT
KIT ROOM TURNOVER OR (KITS) ×3 IMPLANT
NEEDLE HYPO 25GX1X1/2 BEV (NEEDLE) ×3 IMPLANT
NS IRRIG 1000ML POUR BTL (IV SOLUTION) ×3 IMPLANT
PACK ORTHO EXTREMITY (CUSTOM PROCEDURE TRAY) ×3 IMPLANT
PAD ARMBOARD 7.5X6 YLW CONV (MISCELLANEOUS) ×6 IMPLANT
PAD CAST 4YDX4 CTTN HI CHSV (CAST SUPPLIES) IMPLANT
PADDING CAST COTTON 4X4 STRL (CAST SUPPLIES)
SPECIMEN JAR SMALL (MISCELLANEOUS) ×3 IMPLANT
SUT CHROMIC 6 0 PS 4 (SUTURE) IMPLANT
SUT ETHILON 4 0 PS 2 18 (SUTURE) ×3 IMPLANT
SUT MON AB 5-0 PS2 18 (SUTURE) IMPLANT
SUT VIC AB 4-0 P-3 18X BRD (SUTURE) ×1 IMPLANT
SUT VIC AB 4-0 P3 18 (SUTURE) ×2
SUT VICRYL 4-0 PS2 18IN ABS (SUTURE) IMPLANT
SYR CONTROL 10ML LL (SYRINGE) ×3 IMPLANT
TOWEL OR 17X26 10 PK STRL BLUE (TOWEL DISPOSABLE) ×3 IMPLANT
UNDERPAD 30X30 (UNDERPADS AND DIAPERS) ×3 IMPLANT

## 2017-01-11 NOTE — H&P (Signed)
Jack [name] is an 71 y.o. male.   Chief Complaint: right long finger necrosis HPI: 71 yo male s/p distal amputation right long finger for necrosis.  Has been unable to heal surgical wound.  Continued pain.  Some drainage.  No fevers, chills, night sweats.  He wishes to proceed with amputation of the remainder of the digit.  Allergies:  Allergies  Allergen Reactions  . Bee Venom Anaphylaxis  . Lisinopril Cough  . Ivp Dye [Iodinated Diagnostic Agents] Hives  . Morphine And Related Hives and Other (See Comments)    Bradycardia states patient    Past Medical History:  Diagnosis Date  . Anemia   . Arthritis   . Asthma   . Contracture of joint    left knee  . Decubitus ulcer of sacral region, stage 2 03/07/2014  . Diabetes mellitus with peripheral vascular disease ([place])   . End-stage renal disease on hemodialysis [facility])    Started HD March 2014.  Cause of ESRD was DM.  Gets HD at [facility] on [place] on MWF schedule.   Marland Kitchen ESRD on hemodialysis ([place])   . Gangrene ([place])    right BKA  . Gangrene of foot ([place])   . Glaucoma   . Glaucoma 03/07/2014  . Heart murmur   . History of MRSA infection 04/22/2013   Bacteremia assoc w L foot wound infection Mar 2015   . Hyperparathyroidism, secondary renal ([place])   . Hypertension   . Hypothyroidism   . MRSA bacteremia   . Multiple myeloma, without mention of having achieved remission 03/30/2012   Cytogenetic neg on 03/23/2012.  Marland Kitchen Peripheral arterial disease ([place])   . Peripheral vascular disease, unspecified ([place]) 11/19/2012   In the past had R foot toe amps then R TMA. In 2015 had left foot toe amp > then TMA >then L BKA on 05/14/13   . Pneumonia    2012  . Thyroid disease    hyperparathyroidism    Past Surgical History:  Procedure Laterality Date  . ABDOMINAL AORTAGRAM Bilateral 11/06/2012   Procedure: ABDOMINAL AORTAGRAM;  Surgeon: Elam Dutch, MD;  Location: [facility] CATH LAB;  Service: Cardiovascular;  Laterality: Bilateral;  .  AMPUTATION Right 11/10/2012   Procedure: AMPUTATION FIRST and SECOND TOES Right Foot;  Surgeon: Elam Dutch, MD;  Location: [place];  Service: Vascular;  Laterality: Right;  . AMPUTATION Left 04/07/2013   Procedure: AMPUTATION DIGIT- LEFT 1ST TOE;  Surgeon: Mal Misty, MD;  Location: [place];  Service: Vascular;  Laterality: Left;  . AMPUTATION Left 04/26/2013   Procedure: Left Foot Transmetatarsal Amputation;  Surgeon: Newt Minion, MD;  Location: [place];  Service: Orthopedics;  Laterality: Left;  . AMPUTATION Left 05/14/2013   Procedure: AMPUTATION BELOW KNEE;  Surgeon: Newt Minion, MD;  Location: [place];  Service: Orthopedics;  Laterality: Left;  Left Below Knee Amputation  . AMPUTATION Left 07/23/2013   Procedure: AMPUTATION BELOW KNEE;  Surgeon: Newt Minion, MD;  Location: [place];  Service: Orthopedics;  Laterality: Left;  Left Below Knee Amputation Revision  . AMPUTATION Right 02/18/2014   Procedure: AMPUTATION BELOW KNEE;  Surgeon: Newt Minion, MD;  Location: [place];  Service: Orthopedics;  Laterality: Right;  . AMPUTATION Right 08/20/2016   Procedure: RIGHT LONG FINGER AMPUTATION;  Surgeon: Leanora Cover, MD;  Location: [place];  Service: Orthopedics;  Laterality: Right;  . AV FISTULA PLACEMENT Left 03/25/2012   Procedure: ARTERIOVENOUS (AV) FISTULA CREATION;  Surgeon:  Mal Misty, MD;  Location: [place];  Service: Vascular;  Laterality: Left;  . BELOW KNEE LEG AMPUTATION Left 05/14/2013   DR [name]  . BELOW KNEE LEG AMPUTATION Right 02/18/2014   dr duda  . CARDIAC CATHETERIZATION     approx 30 years ago  . CERVICAL DISC SURGERY    . EYE SURGERY Bilateral    CATARACTS  . I&D EXTREMITY Left 04/22/2013   Procedure: IRRIGATION AND DEBRIDEMENT LEFT FIRST TOE AMPUTATION WOUND ;  Surgeon: Mal Misty, MD;  Location: [place];  Service: Vascular;  Laterality: Left;  . I&D EXTREMITY Right 01/05/2014   Procedure: IRRIGATION AND DEBRIDEMENT Right Heel Ulcer;  Surgeon: Mcarthur Rossetti, MD;  Location: [place];  Service: Orthopedics;  Laterality: Right;  Surgeon Available after 5PM  . INSERTION OF DIALYSIS CATHETER Right 03/19/2012   Procedure: INSERTION OF DIALYSIS CATHETER;  Surgeon: Mal Misty, MD;  Location: [place];  Service: Vascular;  Laterality: Right;  Right Internal Jugular  . LIGATION OF COMPETING BRANCHES OF ARTERIOVENOUS FISTULA Left 05/08/2012   Procedure: LIGATION OF COMPETING BRANCHES OF ARTERIOVENOUS FISTULA;  Surgeon: Mal Misty, MD;  Location: [place];  Service: Vascular;  Laterality: Left;  Ultrasound guided  . REPAIR QUADRICEPS/HAMSTRING MUSCLES Left 05/20/2014   Procedure: Left Hamstring Release;  Surgeon: Newt Minion, MD;  Location: [place];  Service: Orthopedics;  Laterality: Left;  . REVISON OF ARTERIOVENOUS FISTULA Left 01/30/2016   Procedure: CEPHALIC VEIN TURNDOWN TO LEFT UPPER ARM;  Surgeon: Elam Dutch, MD;  Location: [place];  Service: Vascular;  Laterality: Left;  . STUMP REVISION Right 05/20/2014   Procedure: Revision Right Below Knee Amputation;  Surgeon: Newt Minion, MD;  Location: [place];  Service: Orthopedics;  Laterality: Right;  . TEE WITHOUT CARDIOVERSION N/A 04/20/2013   Procedure: TRANSESOPHAGEAL ECHOCARDIOGRAM (TEE);  Surgeon: Josue Hector, MD;  Location: [facility] ENDOSCOPY;  Service: Cardiovascular;  Laterality: N/A;  . THYROIDECTOMY    . TOE AMPUTATION     D/C 04-30-13  . TRANSMETATARSAL AMPUTATION Left 12/16/2012   Procedure: TRANSMETATARSAL AMPUTATION AND VAC PLACEMENT;  Surgeon: Elam Dutch, MD;  Location: [facility] OR;  Service: Vascular;  Laterality: Left;    Family History: Family History  Problem Relation Age of Onset  . Diabetes Mother   . Cancer Mother        bone   . Kidney disease Father     Social History:   reports that he quit smoking about 34 years ago. His smoking use included cigarettes. he has never used smokeless tobacco. He reports that he does not drink alcohol or use drugs.  Medications: Medications  Prior to Admission  Medication Sig Dispense Refill  . Ascorbic Acid (VITAMIN C PO) Take 600 mg by mouth daily.    Marland Kitchen aspirin EC 81 MG tablet Take 81 mg by mouth daily.    Marland Kitchen atorvastatin (LIPITOR) 10 MG tablet TAKE 1 TABLET (10 MG TOTAL) BY MOUTH DAILY. 90 tablet 1  . glipiZIDE (GLUCOTROL) 5 MG tablet TAKE 0.5 TABLETS (2.5 MG TOTAL) BY MOUTH DAILY BEFORE BREAKFAST. 45 tablet 2  . glucose blood test strip Use to test blood sugars twice daily. 200 each 3  . lanthanum (FOSRENOL) 1000 MG chewable tablet Chew 1,000 mg by mouth 2 (two) times daily.  6  . levothyroxine (SYNTHROID, LEVOTHROID) 150 MCG tablet TAKE 1/2 TABLET BY MOUTH ON MONDAYS AND 1 TABLET BY MOUTH THE REST OF THE WEEK. 90 tablet 1  .  multivitamin (RENA-VIT) TABS tablet Take 1 tablet by mouth 4 (four) times a week.     Glory Rosebush DELICA LANCETS 82X MISC Use to test blood sugars once daily. (Patient taking differently: 1 each by Other route 2 (two) times daily. Use to test blood sugars once daily.) 100 each 3  . oxyCODONE-acetaminophen (ROXICET) 5-325 MG tablet Take 1 tablet every 12 (twelve) hours as needed by mouth for moderate pain or severe pain. 60 tablet 0    Results for orders placed or performed during the hospital encounter of 01/11/17 (from the past 48 hour(s))  I-STAT 4, (NA,K, GLUC, HGB,HCT)     Status: Abnormal   Collection Time: 01/11/17  7:27 AM  Result Value Ref Range   Sodium 140 135 - 145 mmol/L   Potassium 3.9 3.5 - 5.1 mmol/L   Glucose, Bld 92 65 - 99 mg/dL   HCT 35.0 (L) 39.0 - 52.0 %   Hemoglobin 11.9 (L) 13.0 - 17.0 g/dL    No results found.   Pertinent items are noted in HPI.  Blood pressure (!) 142/86, pulse 70, temperature 98.9 F (37.2 C), temperature source Oral, resp. rate 16, height '5\' 10"'  (1.778 m), weight 74.2 kg (163 lb 8 oz), SpO2 100 %.  General appearance: alert, cooperative and appears stated age Head: Normocephalic, without obvious abnormality, atraumatic Neck: supple, symmetrical,  trachea midline Resp: clear to auscultation bilaterally Cardio: regular rate and rhythm GI: non-tender Extremities: Intact sensation and capillary refill all digits.  +epl/fpl/io.  Draining wound distal aspect right long finger. Pulses: 2+ and symmetric Skin: Skin color, texture, turgor normal. No rashes or lesions Neurologic: Grossly normal Incision/Wound:as above  Assessment/Plan Right long finger necrosis with non healing wound.  Plan amputation at mp joint.  Risks, benefits, and alternatives of surgery were discussed and the patient agrees with the plan of care.   Mansel Strother R 01/11/2017, 8:51 AM

## 2017-01-11 NOTE — Anesthesia Procedure Notes (Signed)
Procedure Name: LMA Insertion Date/Time: 01/11/2017 9:18 AM Performed by: Lance Coon, CRNA Pre-anesthesia Checklist: Patient identified, Emergency Drugs available, Suction available, Patient being monitored and Timeout performed Patient Re-evaluated:Patient Re-evaluated prior to induction Oxygen Delivery Method: Circle system utilized Preoxygenation: Pre-oxygenation with 100% oxygen Induction Type: IV induction LMA: LMA inserted LMA Size: 5.0 Number of attempts: 1 Placement Confirmation: positive ETCO2 and breath sounds checked- equal and bilateral Tube secured with: Tape Dental Injury: Teeth and Oropharynx as per pre-operative assessment

## 2017-01-11 NOTE — Discharge Instructions (Addendum)

## 2017-01-11 NOTE — Brief Op Note (Signed)
01/11/2017  10:14 AM  PATIENT:  Jack [name]  71 y.o. male  PRE-OPERATIVE DIAGNOSIS:  right long finger non healing wound  POST-OPERATIVE DIAGNOSIS:  right long finger non healing wound  PROCEDURE:  Procedure(s): RIGHT LONG FINGER AMPUTATION (Right)  SURGEON:  Surgeon(s) and Role:    Leanora Cover, MD - Primary  PHYSICIAN ASSISTANT:   ASSISTANTS: none   ANESTHESIA:   general  EBL:  5 mL   BLOOD ADMINISTERED:none  DRAINS: none   LOCAL MEDICATIONS USED:  MARCAINE     SPECIMEN:  Source of Specimen:  right long finger  DISPOSITION OF SPECIMEN:  PATHOLOGY  COUNTS:  YES  TOURNIQUET:   Total Tourniquet Time Documented: Upper Arm (Left) - 42 minutes Total: Upper Arm (Left) - 42 minutes   DICTATION: .Other Dictation: Dictation Number [id number]  PLAN OF CARE: Discharge to home after PACU  PATIENT DISPOSITION:  PACU - hemodynamically stable.

## 2017-01-11 NOTE — Op Note (Signed)
NAME:  Jack [name], Jack [name] NO.:  [id number]  MEDICAL RECORD NO.:  [id number]  LOCATION:                                 FACILITY:  PHYSICIAN:  Leanora Cover, MD             DATE OF BIRTH:  DATE OF PROCEDURE:  01/11/2017 DATE OF DISCHARGE:                              OPERATIVE REPORT   PREOPERATIVE DIAGNOSIS:  Right long finger necrosis and nonhealing wound.  POSTOPERATIVE DIAGNOSIS:  Right long finger necrosis and nonhealing wound.  PROCEDURE:  Right long finger amputation through MP joint.  SURGEON:  Leanora Cover, MD.  ASSISTANT:  None.  ANESTHESIA:  General.  IV FLUIDS:  Per anesthesia flow sheet.  ESTIMATED BLOOD LOSS:  Minimal.  COMPLICATIONS:  None.  SPECIMENS:  Right long finger to Pathology for gross examination only.  TOURNIQUET TIME:  41 minutes.  DISPOSITION:  Stable to PACU.  INDICATIONS:  Jack [name] is a 71 year old male, who underwent amputation of the right long finger distally for necrosis several months ago.  He has had trouble healing the wound.  He wishes to have an amputation at the base of the finger to try and get the wound heal. Risks, benefits, and alternatives of surgery were discussed including the risk of blood loss; infection; damage to nerves, vessels, tendons, ligaments, bone; failure of surgery; need for additional surgery; complications with wound healing; continued pain; and nonhealing wound. He voiced understanding of these risks, elected to proceed.  OPERATIVE COURSE:  After being identified preoperatively by myself, the patient and I agreed upon procedure and site of procedure.  Surgical site was marked.  Risks, benefits, and alternatives of surgery were reviewed and he wished to proceed.  Surgical consent had been signed. He was given IV Ancef as preoperative antibiotic prophylaxis.  He was transferred to the operating room and placed on the operating room table in supine position with the right upper extremity  on arm board.  General anesthesia was induced by anesthesiologist.  Right upper extremity was prepped and draped in normal sterile orthopedic fashion.  A surgical pause was performed between surgeons, Anesthesia, and operating room staff; and all were in agreement as to the patient, procedure, and site of procedure.  Tourniquet at proximal aspect of the extremity was inflated to 250 mmHg after exsanguination of the limb with an Esmarch bandage.  Incision was made circumferentially around the right long finger.  The necrotic portion of the wound was covered with an OpSite prior to start of the procedure.  Bipolar electrocautery was used judiciously to obtain hemostasis.  The radial and ulnar digital nerves were identified.  They were placed under traction bipolar and allowed to retract into the soft tissues.  The radial and ulnar digital arteries were identified.  They were tied off with 4-0 Vicryl suture and then treated with bipolar distal to the tie off and allowed to retract.  The finger was then amputated through the MP joint and sent to Pathology for gross examination only.  The wound was clean in appearance.  It was copiously irrigated with sterile saline.  It was then closed with 4-0 nylon in a horizontal mattress fashion.  Good  reapproximation of the soft tissues was obtained without tension.  The area was injected with 0.25% plain Marcaine to aid in postoperative analgesia.  The wound was then dressed with sterile Xeroform, 4x4s, and wrapped with a Kerlix and Coban dressing lightly.  Tourniquet was deflated at 41 minutes. Fingertips were pink with brisk capillary refill after deflation of the tourniquet.  Operative drapes were broken down.  The patient was awoken from anesthesia safely.  He was transferred back to stretcher and taken to PACU in stable condition.  I will see him back in the office in 1 week for postoperative followup.  He will continue on Bactrim DS 1 p.o. b.i.d.  for the remainder of the course that was started yesterday.  We will give him a prescription for Percocet 5/325 one to two p.o. q.6 hours p.r.n. pain, dispensed #20.     Leanora Cover, MD     KK/MEDQ  D:  01/11/2017  T:  01/11/2017  Job:  [id number]

## 2017-01-11 NOTE — Anesthesia Postprocedure Evaluation (Signed)
Anesthesia Post Note  Patient: Jack [name]  Procedure(s) Performed: RIGHT LONG FINGER AMPUTATION (Right Finger)     Patient location during evaluation: PACU Anesthesia Type: General Level of consciousness: awake and alert Pain management: pain level controlled Vital Signs Assessment: post-procedure vital signs reviewed and stable Respiratory status: spontaneous breathing, nonlabored ventilation, respiratory function stable and patient connected to nasal cannula oxygen Cardiovascular status: blood pressure returned to baseline and stable Postop Assessment: no apparent nausea or vomiting Anesthetic complications: no    Last Vitals:  Vitals:   01/11/17 1024 01/11/17 1026  BP: 134/71 134/71  Pulse: 85 85  Resp: 10 15  Temp:  36.6 C  SpO2: 99% 98%    Last Pain:  Vitals:   01/11/17 1026  TempSrc:   PainSc: 0-No pain                 Khyra Viscuso

## 2017-01-11 NOTE — Transfer of Care (Signed)
Immediate Anesthesia Transfer of Care Note  Patient: Jack [name]  Procedure(s) Performed: RIGHT LONG FINGER AMPUTATION (Right Finger)  Patient Location: PACU  Anesthesia Type:General  Level of Consciousness: awake and patient cooperative  Airway & Oxygen Therapy: Patient Spontanous Breathing  Post-op Assessment: Report given to RN and Post -op Vital signs reviewed and stable  Post vital signs: Reviewed and stable  Last Vitals:  Vitals:   01/11/17 0723  BP: (!) 142/86  Pulse: 70  Resp: 16  Temp: 37.2 C  SpO2: 100%    Last Pain:  Vitals:   01/11/17 0723  TempSrc: Oral      Patients Stated Pain Goal: 5 (10/62/69 4854)  Complications: No apparent anesthesia complications

## 2017-01-11 NOTE — Anesthesia Preprocedure Evaluation (Signed)
Anesthesia Evaluation  Patient identified by MRN, date of birth, ID band Patient awake    Reviewed: Allergy & Precautions, NPO status , Patient's Chart, lab work & pertinent test results  History of Anesthesia Complications Negative for: history of anesthetic complications  Airway Mallampati: III  TM Distance: >3 FB Neck ROM: Full    Dental  (+) Dental Advisory Given,    Pulmonary neg shortness of breath, asthma , neg recent URI, former smoker,    breath sounds clear to auscultation       Cardiovascular hypertension, + Peripheral Vascular Disease  + dysrhythmias  Rhythm:Regular Rate:Normal     Neuro/Psych  Neuromuscular disease negative psych ROS   GI/Hepatic negative GI ROS, Neg liver ROS,   Endo/Other  diabetesHypothyroidism Hyperparathyroidism  Renal/GU ESRF and DialysisRenal disease     Musculoskeletal  (+) Arthritis ,   Abdominal   Peds  Hematology  (+) anemia ,   Anesthesia Other Findings Multiple myeloma. Decubitus ulcer. Joint contractures. Glaucoma  Reproductive/Obstetrics                             Anesthesia Physical Anesthesia Plan  ASA: III  Anesthesia Plan: General   Post-op Pain Management:    Induction: Intravenous  PONV Risk Score and Plan: 2 and Ondansetron and Treatment may vary due to age or medical condition  Airway Management Planned: LMA  Additional Equipment: None  Intra-op Plan:   Post-operative Plan: Extubation in OR  Informed Consent: I have reviewed the patients History and Physical, chart, labs and discussed the procedure including the risks, benefits and alternatives for the proposed anesthesia with the patient or authorized representative who has indicated his/her understanding and acceptance.   Dental advisory given  Plan Discussed with: CRNA and Surgeon  Anesthesia Plan Comments:         Anesthesia Quick Evaluation                    Anesthesia Evaluation  Patient identified by MRN, date of birth, ID band Patient awake    Reviewed: Allergy & Precautions, NPO status , Patient's Chart, lab work & pertinent test results  Airway Mallampati: II  TM Distance: >3 FB Neck ROM: Full    Dental  (+) Teeth Intact, Loose,    Pulmonary former smoker,    breath sounds clear to auscultation       Cardiovascular hypertension,  Rhythm:Regular Rate:Normal     Neuro/Psych    GI/Hepatic   Endo/Other  diabetes  Renal/GU      Musculoskeletal   Abdominal   Peds  Hematology   Anesthesia Other Findings   Reproductive/Obstetrics                             Anesthesia Physical Anesthesia Plan  ASA: III  Anesthesia Plan: General   Post-op Pain Management:    Induction: Intravenous  Airway Management Planned: LMA  Additional Equipment:   Intra-op Plan:   Post-operative Plan: Extubation in OR  Informed Consent: I have reviewed the patients History and Physical, chart, labs and discussed the procedure including the risks, benefits and alternatives for the proposed anesthesia with the patient or authorized representative who has indicated his/her understanding and acceptance.   Dental advisory given  Plan Discussed with: CRNA and Anesthesiologist  Anesthesia Plan Comments:         Anesthesia Quick Evaluation

## 2017-01-11 NOTE — Op Note (Signed)
[id number] 

## 2017-01-12 ENCOUNTER — Encounter (HOSPITAL_COMMUNITY): Payer: Self-pay | Admitting: Orthopedic Surgery

## 2017-01-12 DIAGNOSIS — R509 Fever, unspecified: Secondary | ICD-10-CM | POA: Diagnosis not present

## 2017-01-12 DIAGNOSIS — N186 End stage renal disease: Secondary | ICD-10-CM | POA: Diagnosis not present

## 2017-01-12 DIAGNOSIS — E1029 Type 1 diabetes mellitus with other diabetic kidney complication: Secondary | ICD-10-CM | POA: Diagnosis not present

## 2017-01-12 DIAGNOSIS — D631 Anemia in chronic kidney disease: Secondary | ICD-10-CM | POA: Diagnosis not present

## 2017-01-12 DIAGNOSIS — N2581 Secondary hyperparathyroidism of renal origin: Secondary | ICD-10-CM | POA: Diagnosis not present

## 2017-01-12 DIAGNOSIS — T82598A Other mechanical complication of other cardiac and vascular devices and implants, initial encounter: Secondary | ICD-10-CM | POA: Diagnosis not present

## 2017-01-12 DIAGNOSIS — D689 Coagulation defect, unspecified: Secondary | ICD-10-CM | POA: Diagnosis not present

## 2017-01-13 DIAGNOSIS — Z992 Dependence on renal dialysis: Secondary | ICD-10-CM | POA: Diagnosis not present

## 2017-01-13 DIAGNOSIS — N186 End stage renal disease: Secondary | ICD-10-CM | POA: Diagnosis not present

## 2017-01-13 DIAGNOSIS — C9000 Multiple myeloma not having achieved remission: Secondary | ICD-10-CM | POA: Diagnosis not present

## 2017-01-15 DIAGNOSIS — N2581 Secondary hyperparathyroidism of renal origin: Secondary | ICD-10-CM | POA: Diagnosis not present

## 2017-01-15 DIAGNOSIS — D689 Coagulation defect, unspecified: Secondary | ICD-10-CM | POA: Diagnosis not present

## 2017-01-15 DIAGNOSIS — T82598A Other mechanical complication of other cardiac and vascular devices and implants, initial encounter: Secondary | ICD-10-CM | POA: Diagnosis not present

## 2017-01-15 DIAGNOSIS — N186 End stage renal disease: Secondary | ICD-10-CM | POA: Diagnosis not present

## 2017-01-15 DIAGNOSIS — E1029 Type 1 diabetes mellitus with other diabetic kidney complication: Secondary | ICD-10-CM | POA: Diagnosis not present

## 2017-01-15 DIAGNOSIS — D631 Anemia in chronic kidney disease: Secondary | ICD-10-CM | POA: Diagnosis not present

## 2017-01-17 DIAGNOSIS — E1029 Type 1 diabetes mellitus with other diabetic kidney complication: Secondary | ICD-10-CM | POA: Diagnosis not present

## 2017-01-17 DIAGNOSIS — D689 Coagulation defect, unspecified: Secondary | ICD-10-CM | POA: Diagnosis not present

## 2017-01-17 DIAGNOSIS — N2581 Secondary hyperparathyroidism of renal origin: Secondary | ICD-10-CM | POA: Diagnosis not present

## 2017-01-17 DIAGNOSIS — T82598A Other mechanical complication of other cardiac and vascular devices and implants, initial encounter: Secondary | ICD-10-CM | POA: Diagnosis not present

## 2017-01-17 DIAGNOSIS — D631 Anemia in chronic kidney disease: Secondary | ICD-10-CM | POA: Diagnosis not present

## 2017-01-17 DIAGNOSIS — N186 End stage renal disease: Secondary | ICD-10-CM | POA: Diagnosis not present

## 2017-01-20 DIAGNOSIS — D631 Anemia in chronic kidney disease: Secondary | ICD-10-CM | POA: Diagnosis not present

## 2017-01-20 DIAGNOSIS — E1029 Type 1 diabetes mellitus with other diabetic kidney complication: Secondary | ICD-10-CM | POA: Diagnosis not present

## 2017-01-20 DIAGNOSIS — N2581 Secondary hyperparathyroidism of renal origin: Secondary | ICD-10-CM | POA: Diagnosis not present

## 2017-01-20 DIAGNOSIS — D689 Coagulation defect, unspecified: Secondary | ICD-10-CM | POA: Diagnosis not present

## 2017-01-20 DIAGNOSIS — N186 End stage renal disease: Secondary | ICD-10-CM | POA: Diagnosis not present

## 2017-01-20 DIAGNOSIS — T82598A Other mechanical complication of other cardiac and vascular devices and implants, initial encounter: Secondary | ICD-10-CM | POA: Diagnosis not present

## 2017-01-21 ENCOUNTER — Encounter: Payer: Medicare Other | Admitting: Occupational Therapy

## 2017-01-22 DIAGNOSIS — E1029 Type 1 diabetes mellitus with other diabetic kidney complication: Secondary | ICD-10-CM | POA: Diagnosis not present

## 2017-01-22 DIAGNOSIS — N2581 Secondary hyperparathyroidism of renal origin: Secondary | ICD-10-CM | POA: Diagnosis not present

## 2017-01-22 DIAGNOSIS — D631 Anemia in chronic kidney disease: Secondary | ICD-10-CM | POA: Diagnosis not present

## 2017-01-22 DIAGNOSIS — N186 End stage renal disease: Secondary | ICD-10-CM | POA: Diagnosis not present

## 2017-01-22 DIAGNOSIS — D689 Coagulation defect, unspecified: Secondary | ICD-10-CM | POA: Diagnosis not present

## 2017-01-22 DIAGNOSIS — T82598A Other mechanical complication of other cardiac and vascular devices and implants, initial encounter: Secondary | ICD-10-CM | POA: Diagnosis not present

## 2017-01-24 DIAGNOSIS — D689 Coagulation defect, unspecified: Secondary | ICD-10-CM | POA: Diagnosis not present

## 2017-01-24 DIAGNOSIS — N2581 Secondary hyperparathyroidism of renal origin: Secondary | ICD-10-CM | POA: Diagnosis not present

## 2017-01-24 DIAGNOSIS — T82598A Other mechanical complication of other cardiac and vascular devices and implants, initial encounter: Secondary | ICD-10-CM | POA: Diagnosis not present

## 2017-01-24 DIAGNOSIS — N186 End stage renal disease: Secondary | ICD-10-CM | POA: Diagnosis not present

## 2017-01-24 DIAGNOSIS — D631 Anemia in chronic kidney disease: Secondary | ICD-10-CM | POA: Diagnosis not present

## 2017-01-24 DIAGNOSIS — E1029 Type 1 diabetes mellitus with other diabetic kidney complication: Secondary | ICD-10-CM | POA: Diagnosis not present

## 2017-01-27 DIAGNOSIS — N186 End stage renal disease: Secondary | ICD-10-CM | POA: Diagnosis not present

## 2017-01-27 DIAGNOSIS — E1029 Type 1 diabetes mellitus with other diabetic kidney complication: Secondary | ICD-10-CM | POA: Diagnosis not present

## 2017-01-27 DIAGNOSIS — D689 Coagulation defect, unspecified: Secondary | ICD-10-CM | POA: Diagnosis not present

## 2017-01-27 DIAGNOSIS — T82598A Other mechanical complication of other cardiac and vascular devices and implants, initial encounter: Secondary | ICD-10-CM | POA: Diagnosis not present

## 2017-01-27 DIAGNOSIS — D631 Anemia in chronic kidney disease: Secondary | ICD-10-CM | POA: Diagnosis not present

## 2017-01-27 DIAGNOSIS — N2581 Secondary hyperparathyroidism of renal origin: Secondary | ICD-10-CM | POA: Diagnosis not present

## 2017-01-29 DIAGNOSIS — D689 Coagulation defect, unspecified: Secondary | ICD-10-CM | POA: Diagnosis not present

## 2017-01-29 DIAGNOSIS — D631 Anemia in chronic kidney disease: Secondary | ICD-10-CM | POA: Diagnosis not present

## 2017-01-29 DIAGNOSIS — E1029 Type 1 diabetes mellitus with other diabetic kidney complication: Secondary | ICD-10-CM | POA: Diagnosis not present

## 2017-01-29 DIAGNOSIS — T82598A Other mechanical complication of other cardiac and vascular devices and implants, initial encounter: Secondary | ICD-10-CM | POA: Diagnosis not present

## 2017-01-29 DIAGNOSIS — N2581 Secondary hyperparathyroidism of renal origin: Secondary | ICD-10-CM | POA: Diagnosis not present

## 2017-01-29 DIAGNOSIS — N186 End stage renal disease: Secondary | ICD-10-CM | POA: Diagnosis not present

## 2017-01-31 DIAGNOSIS — D689 Coagulation defect, unspecified: Secondary | ICD-10-CM | POA: Diagnosis not present

## 2017-01-31 DIAGNOSIS — N186 End stage renal disease: Secondary | ICD-10-CM | POA: Diagnosis not present

## 2017-01-31 DIAGNOSIS — D631 Anemia in chronic kidney disease: Secondary | ICD-10-CM | POA: Diagnosis not present

## 2017-01-31 DIAGNOSIS — N2581 Secondary hyperparathyroidism of renal origin: Secondary | ICD-10-CM | POA: Diagnosis not present

## 2017-01-31 DIAGNOSIS — T82598A Other mechanical complication of other cardiac and vascular devices and implants, initial encounter: Secondary | ICD-10-CM | POA: Diagnosis not present

## 2017-01-31 DIAGNOSIS — E1029 Type 1 diabetes mellitus with other diabetic kidney complication: Secondary | ICD-10-CM | POA: Diagnosis not present

## 2017-02-04 ENCOUNTER — Encounter: Payer: Medicare Other | Admitting: Occupational Therapy

## 2017-02-05 DIAGNOSIS — D689 Coagulation defect, unspecified: Secondary | ICD-10-CM | POA: Diagnosis not present

## 2017-02-05 DIAGNOSIS — D631 Anemia in chronic kidney disease: Secondary | ICD-10-CM | POA: Diagnosis not present

## 2017-02-05 DIAGNOSIS — N186 End stage renal disease: Secondary | ICD-10-CM | POA: Diagnosis not present

## 2017-02-05 DIAGNOSIS — T82598A Other mechanical complication of other cardiac and vascular devices and implants, initial encounter: Secondary | ICD-10-CM | POA: Diagnosis not present

## 2017-02-05 DIAGNOSIS — E1029 Type 1 diabetes mellitus with other diabetic kidney complication: Secondary | ICD-10-CM | POA: Diagnosis not present

## 2017-02-05 DIAGNOSIS — N2581 Secondary hyperparathyroidism of renal origin: Secondary | ICD-10-CM | POA: Diagnosis not present

## 2017-02-07 DIAGNOSIS — N2581 Secondary hyperparathyroidism of renal origin: Secondary | ICD-10-CM | POA: Diagnosis not present

## 2017-02-07 DIAGNOSIS — T82598A Other mechanical complication of other cardiac and vascular devices and implants, initial encounter: Secondary | ICD-10-CM | POA: Diagnosis not present

## 2017-02-07 DIAGNOSIS — N186 End stage renal disease: Secondary | ICD-10-CM | POA: Diagnosis not present

## 2017-02-07 DIAGNOSIS — D631 Anemia in chronic kidney disease: Secondary | ICD-10-CM | POA: Diagnosis not present

## 2017-02-07 DIAGNOSIS — E1029 Type 1 diabetes mellitus with other diabetic kidney complication: Secondary | ICD-10-CM | POA: Diagnosis not present

## 2017-02-07 DIAGNOSIS — D689 Coagulation defect, unspecified: Secondary | ICD-10-CM | POA: Diagnosis not present

## 2017-02-10 DIAGNOSIS — E1029 Type 1 diabetes mellitus with other diabetic kidney complication: Secondary | ICD-10-CM | POA: Diagnosis not present

## 2017-02-10 DIAGNOSIS — N2581 Secondary hyperparathyroidism of renal origin: Secondary | ICD-10-CM | POA: Diagnosis not present

## 2017-02-10 DIAGNOSIS — D689 Coagulation defect, unspecified: Secondary | ICD-10-CM | POA: Diagnosis not present

## 2017-02-10 DIAGNOSIS — N186 End stage renal disease: Secondary | ICD-10-CM | POA: Diagnosis not present

## 2017-02-10 DIAGNOSIS — D631 Anemia in chronic kidney disease: Secondary | ICD-10-CM | POA: Diagnosis not present

## 2017-02-10 DIAGNOSIS — T82598A Other mechanical complication of other cardiac and vascular devices and implants, initial encounter: Secondary | ICD-10-CM | POA: Diagnosis not present

## 2017-02-12 DIAGNOSIS — E1029 Type 1 diabetes mellitus with other diabetic kidney complication: Secondary | ICD-10-CM | POA: Diagnosis not present

## 2017-02-12 DIAGNOSIS — N186 End stage renal disease: Secondary | ICD-10-CM | POA: Diagnosis not present

## 2017-02-12 DIAGNOSIS — D689 Coagulation defect, unspecified: Secondary | ICD-10-CM | POA: Diagnosis not present

## 2017-02-12 DIAGNOSIS — D631 Anemia in chronic kidney disease: Secondary | ICD-10-CM | POA: Diagnosis not present

## 2017-02-12 DIAGNOSIS — N2581 Secondary hyperparathyroidism of renal origin: Secondary | ICD-10-CM | POA: Diagnosis not present

## 2017-02-12 DIAGNOSIS — T82598A Other mechanical complication of other cardiac and vascular devices and implants, initial encounter: Secondary | ICD-10-CM | POA: Diagnosis not present

## 2017-02-13 DIAGNOSIS — Z992 Dependence on renal dialysis: Secondary | ICD-10-CM | POA: Diagnosis not present

## 2017-02-13 DIAGNOSIS — N186 End stage renal disease: Secondary | ICD-10-CM | POA: Diagnosis not present

## 2017-02-13 DIAGNOSIS — C9000 Multiple myeloma not having achieved remission: Secondary | ICD-10-CM | POA: Diagnosis not present

## 2017-02-14 DIAGNOSIS — N186 End stage renal disease: Secondary | ICD-10-CM | POA: Diagnosis not present

## 2017-02-14 DIAGNOSIS — Z992 Dependence on renal dialysis: Secondary | ICD-10-CM | POA: Diagnosis not present

## 2017-02-14 DIAGNOSIS — D689 Coagulation defect, unspecified: Secondary | ICD-10-CM | POA: Diagnosis not present

## 2017-02-14 DIAGNOSIS — D631 Anemia in chronic kidney disease: Secondary | ICD-10-CM | POA: Diagnosis not present

## 2017-02-14 DIAGNOSIS — R509 Fever, unspecified: Secondary | ICD-10-CM | POA: Diagnosis not present

## 2017-02-14 DIAGNOSIS — C9000 Multiple myeloma not having achieved remission: Secondary | ICD-10-CM | POA: Diagnosis not present

## 2017-02-14 DIAGNOSIS — N2581 Secondary hyperparathyroidism of renal origin: Secondary | ICD-10-CM | POA: Diagnosis not present

## 2017-02-14 DIAGNOSIS — T82598A Other mechanical complication of other cardiac and vascular devices and implants, initial encounter: Secondary | ICD-10-CM | POA: Diagnosis not present

## 2017-02-17 DIAGNOSIS — D631 Anemia in chronic kidney disease: Secondary | ICD-10-CM | POA: Diagnosis not present

## 2017-02-17 DIAGNOSIS — N186 End stage renal disease: Secondary | ICD-10-CM | POA: Diagnosis not present

## 2017-02-17 DIAGNOSIS — R509 Fever, unspecified: Secondary | ICD-10-CM | POA: Diagnosis not present

## 2017-02-17 DIAGNOSIS — T82598A Other mechanical complication of other cardiac and vascular devices and implants, initial encounter: Secondary | ICD-10-CM | POA: Diagnosis not present

## 2017-02-17 DIAGNOSIS — N2581 Secondary hyperparathyroidism of renal origin: Secondary | ICD-10-CM | POA: Diagnosis not present

## 2017-02-17 DIAGNOSIS — D689 Coagulation defect, unspecified: Secondary | ICD-10-CM | POA: Diagnosis not present

## 2017-02-18 ENCOUNTER — Encounter: Payer: Medicare Other | Admitting: Occupational Therapy

## 2017-02-19 DIAGNOSIS — N2581 Secondary hyperparathyroidism of renal origin: Secondary | ICD-10-CM | POA: Diagnosis not present

## 2017-02-19 DIAGNOSIS — T82598A Other mechanical complication of other cardiac and vascular devices and implants, initial encounter: Secondary | ICD-10-CM | POA: Diagnosis not present

## 2017-02-19 DIAGNOSIS — R509 Fever, unspecified: Secondary | ICD-10-CM | POA: Diagnosis not present

## 2017-02-19 DIAGNOSIS — D689 Coagulation defect, unspecified: Secondary | ICD-10-CM | POA: Diagnosis not present

## 2017-02-19 DIAGNOSIS — D631 Anemia in chronic kidney disease: Secondary | ICD-10-CM | POA: Diagnosis not present

## 2017-02-19 DIAGNOSIS — N186 End stage renal disease: Secondary | ICD-10-CM | POA: Diagnosis not present

## 2017-02-21 DIAGNOSIS — R509 Fever, unspecified: Secondary | ICD-10-CM | POA: Diagnosis not present

## 2017-02-21 DIAGNOSIS — N186 End stage renal disease: Secondary | ICD-10-CM | POA: Diagnosis not present

## 2017-02-21 DIAGNOSIS — N2581 Secondary hyperparathyroidism of renal origin: Secondary | ICD-10-CM | POA: Diagnosis not present

## 2017-02-21 DIAGNOSIS — D631 Anemia in chronic kidney disease: Secondary | ICD-10-CM | POA: Diagnosis not present

## 2017-02-21 DIAGNOSIS — T82598A Other mechanical complication of other cardiac and vascular devices and implants, initial encounter: Secondary | ICD-10-CM | POA: Diagnosis not present

## 2017-02-21 DIAGNOSIS — D689 Coagulation defect, unspecified: Secondary | ICD-10-CM | POA: Diagnosis not present

## 2017-02-24 DIAGNOSIS — N186 End stage renal disease: Secondary | ICD-10-CM | POA: Diagnosis not present

## 2017-02-24 DIAGNOSIS — T82598A Other mechanical complication of other cardiac and vascular devices and implants, initial encounter: Secondary | ICD-10-CM | POA: Diagnosis not present

## 2017-02-24 DIAGNOSIS — D689 Coagulation defect, unspecified: Secondary | ICD-10-CM | POA: Diagnosis not present

## 2017-02-24 DIAGNOSIS — N2581 Secondary hyperparathyroidism of renal origin: Secondary | ICD-10-CM | POA: Diagnosis not present

## 2017-02-24 DIAGNOSIS — D631 Anemia in chronic kidney disease: Secondary | ICD-10-CM | POA: Diagnosis not present

## 2017-02-24 DIAGNOSIS — R509 Fever, unspecified: Secondary | ICD-10-CM | POA: Diagnosis not present

## 2017-02-26 DIAGNOSIS — T82598A Other mechanical complication of other cardiac and vascular devices and implants, initial encounter: Secondary | ICD-10-CM | POA: Diagnosis not present

## 2017-02-26 DIAGNOSIS — D631 Anemia in chronic kidney disease: Secondary | ICD-10-CM | POA: Diagnosis not present

## 2017-02-26 DIAGNOSIS — D689 Coagulation defect, unspecified: Secondary | ICD-10-CM | POA: Diagnosis not present

## 2017-02-26 DIAGNOSIS — R509 Fever, unspecified: Secondary | ICD-10-CM | POA: Diagnosis not present

## 2017-02-26 DIAGNOSIS — N186 End stage renal disease: Secondary | ICD-10-CM | POA: Diagnosis not present

## 2017-02-26 DIAGNOSIS — N2581 Secondary hyperparathyroidism of renal origin: Secondary | ICD-10-CM | POA: Diagnosis not present

## 2017-02-28 DIAGNOSIS — D689 Coagulation defect, unspecified: Secondary | ICD-10-CM | POA: Diagnosis not present

## 2017-02-28 DIAGNOSIS — N2581 Secondary hyperparathyroidism of renal origin: Secondary | ICD-10-CM | POA: Diagnosis not present

## 2017-02-28 DIAGNOSIS — T82598A Other mechanical complication of other cardiac and vascular devices and implants, initial encounter: Secondary | ICD-10-CM | POA: Diagnosis not present

## 2017-02-28 DIAGNOSIS — D631 Anemia in chronic kidney disease: Secondary | ICD-10-CM | POA: Diagnosis not present

## 2017-02-28 DIAGNOSIS — N186 End stage renal disease: Secondary | ICD-10-CM | POA: Diagnosis not present

## 2017-02-28 DIAGNOSIS — R509 Fever, unspecified: Secondary | ICD-10-CM | POA: Diagnosis not present

## 2017-03-05 DIAGNOSIS — D631 Anemia in chronic kidney disease: Secondary | ICD-10-CM | POA: Diagnosis not present

## 2017-03-05 DIAGNOSIS — T82598A Other mechanical complication of other cardiac and vascular devices and implants, initial encounter: Secondary | ICD-10-CM | POA: Diagnosis not present

## 2017-03-05 DIAGNOSIS — D689 Coagulation defect, unspecified: Secondary | ICD-10-CM | POA: Diagnosis not present

## 2017-03-05 DIAGNOSIS — N186 End stage renal disease: Secondary | ICD-10-CM | POA: Diagnosis not present

## 2017-03-05 DIAGNOSIS — R509 Fever, unspecified: Secondary | ICD-10-CM | POA: Diagnosis not present

## 2017-03-05 DIAGNOSIS — N2581 Secondary hyperparathyroidism of renal origin: Secondary | ICD-10-CM | POA: Diagnosis not present

## 2017-03-07 DIAGNOSIS — T82598A Other mechanical complication of other cardiac and vascular devices and implants, initial encounter: Secondary | ICD-10-CM | POA: Diagnosis not present

## 2017-03-07 DIAGNOSIS — N186 End stage renal disease: Secondary | ICD-10-CM | POA: Diagnosis not present

## 2017-03-07 DIAGNOSIS — N2581 Secondary hyperparathyroidism of renal origin: Secondary | ICD-10-CM | POA: Diagnosis not present

## 2017-03-07 DIAGNOSIS — D631 Anemia in chronic kidney disease: Secondary | ICD-10-CM | POA: Diagnosis not present

## 2017-03-07 DIAGNOSIS — D689 Coagulation defect, unspecified: Secondary | ICD-10-CM | POA: Diagnosis not present

## 2017-03-07 DIAGNOSIS — R509 Fever, unspecified: Secondary | ICD-10-CM | POA: Diagnosis not present

## 2017-03-10 DIAGNOSIS — N186 End stage renal disease: Secondary | ICD-10-CM | POA: Diagnosis not present

## 2017-03-10 DIAGNOSIS — D689 Coagulation defect, unspecified: Secondary | ICD-10-CM | POA: Diagnosis not present

## 2017-03-10 DIAGNOSIS — N2581 Secondary hyperparathyroidism of renal origin: Secondary | ICD-10-CM | POA: Diagnosis not present

## 2017-03-10 DIAGNOSIS — R509 Fever, unspecified: Secondary | ICD-10-CM | POA: Diagnosis not present

## 2017-03-10 DIAGNOSIS — T82598A Other mechanical complication of other cardiac and vascular devices and implants, initial encounter: Secondary | ICD-10-CM | POA: Diagnosis not present

## 2017-03-10 DIAGNOSIS — D631 Anemia in chronic kidney disease: Secondary | ICD-10-CM | POA: Diagnosis not present

## 2017-03-14 DIAGNOSIS — D631 Anemia in chronic kidney disease: Secondary | ICD-10-CM | POA: Diagnosis not present

## 2017-03-14 DIAGNOSIS — Z992 Dependence on renal dialysis: Secondary | ICD-10-CM | POA: Diagnosis not present

## 2017-03-14 DIAGNOSIS — T82598A Other mechanical complication of other cardiac and vascular devices and implants, initial encounter: Secondary | ICD-10-CM | POA: Diagnosis not present

## 2017-03-14 DIAGNOSIS — M869 Osteomyelitis, unspecified: Secondary | ICD-10-CM | POA: Diagnosis not present

## 2017-03-14 DIAGNOSIS — C9000 Multiple myeloma not having achieved remission: Secondary | ICD-10-CM | POA: Diagnosis not present

## 2017-03-14 DIAGNOSIS — E1029 Type 1 diabetes mellitus with other diabetic kidney complication: Secondary | ICD-10-CM | POA: Diagnosis not present

## 2017-03-14 DIAGNOSIS — D689 Coagulation defect, unspecified: Secondary | ICD-10-CM | POA: Diagnosis not present

## 2017-03-14 DIAGNOSIS — R509 Fever, unspecified: Secondary | ICD-10-CM | POA: Diagnosis not present

## 2017-03-14 DIAGNOSIS — N2581 Secondary hyperparathyroidism of renal origin: Secondary | ICD-10-CM | POA: Diagnosis not present

## 2017-03-14 DIAGNOSIS — N186 End stage renal disease: Secondary | ICD-10-CM | POA: Diagnosis not present

## 2017-03-17 ENCOUNTER — Other Ambulatory Visit: Payer: Self-pay | Admitting: Family Medicine

## 2017-03-17 DIAGNOSIS — N186 End stage renal disease: Secondary | ICD-10-CM | POA: Diagnosis not present

## 2017-03-17 DIAGNOSIS — T82598A Other mechanical complication of other cardiac and vascular devices and implants, initial encounter: Secondary | ICD-10-CM | POA: Diagnosis not present

## 2017-03-17 DIAGNOSIS — E1029 Type 1 diabetes mellitus with other diabetic kidney complication: Secondary | ICD-10-CM | POA: Diagnosis not present

## 2017-03-17 DIAGNOSIS — D631 Anemia in chronic kidney disease: Secondary | ICD-10-CM | POA: Diagnosis not present

## 2017-03-17 DIAGNOSIS — M869 Osteomyelitis, unspecified: Secondary | ICD-10-CM | POA: Diagnosis not present

## 2017-03-17 DIAGNOSIS — D689 Coagulation defect, unspecified: Secondary | ICD-10-CM | POA: Diagnosis not present

## 2017-03-17 DIAGNOSIS — Z992 Dependence on renal dialysis: Secondary | ICD-10-CM | POA: Diagnosis not present

## 2017-03-17 DIAGNOSIS — R509 Fever, unspecified: Secondary | ICD-10-CM | POA: Diagnosis not present

## 2017-03-17 DIAGNOSIS — N2581 Secondary hyperparathyroidism of renal origin: Secondary | ICD-10-CM | POA: Diagnosis not present

## 2017-03-19 DIAGNOSIS — R509 Fever, unspecified: Secondary | ICD-10-CM | POA: Diagnosis not present

## 2017-03-19 DIAGNOSIS — N2581 Secondary hyperparathyroidism of renal origin: Secondary | ICD-10-CM | POA: Diagnosis not present

## 2017-03-19 DIAGNOSIS — T82598A Other mechanical complication of other cardiac and vascular devices and implants, initial encounter: Secondary | ICD-10-CM | POA: Diagnosis not present

## 2017-03-19 DIAGNOSIS — M869 Osteomyelitis, unspecified: Secondary | ICD-10-CM | POA: Diagnosis not present

## 2017-03-19 DIAGNOSIS — N186 End stage renal disease: Secondary | ICD-10-CM | POA: Diagnosis not present

## 2017-03-19 DIAGNOSIS — D631 Anemia in chronic kidney disease: Secondary | ICD-10-CM | POA: Diagnosis not present

## 2017-03-19 DIAGNOSIS — Z992 Dependence on renal dialysis: Secondary | ICD-10-CM | POA: Diagnosis not present

## 2017-03-19 DIAGNOSIS — E1029 Type 1 diabetes mellitus with other diabetic kidney complication: Secondary | ICD-10-CM | POA: Diagnosis not present

## 2017-03-19 DIAGNOSIS — D689 Coagulation defect, unspecified: Secondary | ICD-10-CM | POA: Diagnosis not present

## 2017-03-21 DIAGNOSIS — D631 Anemia in chronic kidney disease: Secondary | ICD-10-CM | POA: Diagnosis not present

## 2017-03-21 DIAGNOSIS — Z992 Dependence on renal dialysis: Secondary | ICD-10-CM | POA: Diagnosis not present

## 2017-03-21 DIAGNOSIS — E1029 Type 1 diabetes mellitus with other diabetic kidney complication: Secondary | ICD-10-CM | POA: Diagnosis not present

## 2017-03-21 DIAGNOSIS — M869 Osteomyelitis, unspecified: Secondary | ICD-10-CM | POA: Diagnosis not present

## 2017-03-21 DIAGNOSIS — R509 Fever, unspecified: Secondary | ICD-10-CM | POA: Diagnosis not present

## 2017-03-21 DIAGNOSIS — T82598A Other mechanical complication of other cardiac and vascular devices and implants, initial encounter: Secondary | ICD-10-CM | POA: Diagnosis not present

## 2017-03-21 DIAGNOSIS — N2581 Secondary hyperparathyroidism of renal origin: Secondary | ICD-10-CM | POA: Diagnosis not present

## 2017-03-21 DIAGNOSIS — D689 Coagulation defect, unspecified: Secondary | ICD-10-CM | POA: Diagnosis not present

## 2017-03-21 DIAGNOSIS — N186 End stage renal disease: Secondary | ICD-10-CM | POA: Diagnosis not present

## 2017-03-24 ENCOUNTER — Other Ambulatory Visit: Payer: Self-pay | Admitting: Family Medicine

## 2017-03-24 DIAGNOSIS — N2581 Secondary hyperparathyroidism of renal origin: Secondary | ICD-10-CM | POA: Diagnosis not present

## 2017-03-24 DIAGNOSIS — M869 Osteomyelitis, unspecified: Secondary | ICD-10-CM | POA: Diagnosis not present

## 2017-03-24 DIAGNOSIS — E1029 Type 1 diabetes mellitus with other diabetic kidney complication: Secondary | ICD-10-CM | POA: Diagnosis not present

## 2017-03-24 DIAGNOSIS — R509 Fever, unspecified: Secondary | ICD-10-CM | POA: Diagnosis not present

## 2017-03-24 DIAGNOSIS — N186 End stage renal disease: Secondary | ICD-10-CM | POA: Diagnosis not present

## 2017-03-24 DIAGNOSIS — Z992 Dependence on renal dialysis: Secondary | ICD-10-CM | POA: Diagnosis not present

## 2017-03-24 DIAGNOSIS — D689 Coagulation defect, unspecified: Secondary | ICD-10-CM | POA: Diagnosis not present

## 2017-03-24 DIAGNOSIS — D631 Anemia in chronic kidney disease: Secondary | ICD-10-CM | POA: Diagnosis not present

## 2017-03-24 DIAGNOSIS — T82598A Other mechanical complication of other cardiac and vascular devices and implants, initial encounter: Secondary | ICD-10-CM | POA: Diagnosis not present

## 2017-03-24 DIAGNOSIS — E1151 Type 2 diabetes mellitus with diabetic peripheral angiopathy without gangrene: Secondary | ICD-10-CM

## 2017-03-26 DIAGNOSIS — R509 Fever, unspecified: Secondary | ICD-10-CM | POA: Diagnosis not present

## 2017-03-26 DIAGNOSIS — T82598A Other mechanical complication of other cardiac and vascular devices and implants, initial encounter: Secondary | ICD-10-CM | POA: Diagnosis not present

## 2017-03-26 DIAGNOSIS — N186 End stage renal disease: Secondary | ICD-10-CM | POA: Diagnosis not present

## 2017-03-26 DIAGNOSIS — M869 Osteomyelitis, unspecified: Secondary | ICD-10-CM | POA: Diagnosis not present

## 2017-03-26 DIAGNOSIS — E1029 Type 1 diabetes mellitus with other diabetic kidney complication: Secondary | ICD-10-CM | POA: Diagnosis not present

## 2017-03-26 DIAGNOSIS — Z992 Dependence on renal dialysis: Secondary | ICD-10-CM | POA: Diagnosis not present

## 2017-03-26 DIAGNOSIS — D689 Coagulation defect, unspecified: Secondary | ICD-10-CM | POA: Diagnosis not present

## 2017-03-26 DIAGNOSIS — D631 Anemia in chronic kidney disease: Secondary | ICD-10-CM | POA: Diagnosis not present

## 2017-03-26 DIAGNOSIS — N2581 Secondary hyperparathyroidism of renal origin: Secondary | ICD-10-CM | POA: Diagnosis not present

## 2017-03-28 DIAGNOSIS — N186 End stage renal disease: Secondary | ICD-10-CM | POA: Diagnosis not present

## 2017-03-28 DIAGNOSIS — D631 Anemia in chronic kidney disease: Secondary | ICD-10-CM | POA: Diagnosis not present

## 2017-03-28 DIAGNOSIS — R509 Fever, unspecified: Secondary | ICD-10-CM | POA: Diagnosis not present

## 2017-03-28 DIAGNOSIS — N2581 Secondary hyperparathyroidism of renal origin: Secondary | ICD-10-CM | POA: Diagnosis not present

## 2017-03-28 DIAGNOSIS — Z992 Dependence on renal dialysis: Secondary | ICD-10-CM | POA: Diagnosis not present

## 2017-03-28 DIAGNOSIS — E1029 Type 1 diabetes mellitus with other diabetic kidney complication: Secondary | ICD-10-CM | POA: Diagnosis not present

## 2017-03-28 DIAGNOSIS — D689 Coagulation defect, unspecified: Secondary | ICD-10-CM | POA: Diagnosis not present

## 2017-03-28 DIAGNOSIS — M869 Osteomyelitis, unspecified: Secondary | ICD-10-CM | POA: Diagnosis not present

## 2017-03-28 DIAGNOSIS — T82598A Other mechanical complication of other cardiac and vascular devices and implants, initial encounter: Secondary | ICD-10-CM | POA: Diagnosis not present

## 2017-03-31 DIAGNOSIS — Z992 Dependence on renal dialysis: Secondary | ICD-10-CM | POA: Diagnosis not present

## 2017-03-31 DIAGNOSIS — D689 Coagulation defect, unspecified: Secondary | ICD-10-CM | POA: Diagnosis not present

## 2017-03-31 DIAGNOSIS — M869 Osteomyelitis, unspecified: Secondary | ICD-10-CM | POA: Diagnosis not present

## 2017-03-31 DIAGNOSIS — N2581 Secondary hyperparathyroidism of renal origin: Secondary | ICD-10-CM | POA: Diagnosis not present

## 2017-03-31 DIAGNOSIS — E1029 Type 1 diabetes mellitus with other diabetic kidney complication: Secondary | ICD-10-CM | POA: Diagnosis not present

## 2017-03-31 DIAGNOSIS — T82598A Other mechanical complication of other cardiac and vascular devices and implants, initial encounter: Secondary | ICD-10-CM | POA: Diagnosis not present

## 2017-03-31 DIAGNOSIS — R509 Fever, unspecified: Secondary | ICD-10-CM | POA: Diagnosis not present

## 2017-03-31 DIAGNOSIS — N186 End stage renal disease: Secondary | ICD-10-CM | POA: Diagnosis not present

## 2017-03-31 DIAGNOSIS — D631 Anemia in chronic kidney disease: Secondary | ICD-10-CM | POA: Diagnosis not present

## 2017-04-01 ENCOUNTER — Ambulatory Visit (INDEPENDENT_AMBULATORY_CARE_PROVIDER_SITE_OTHER): Payer: Medicare Other | Admitting: Family Medicine

## 2017-04-01 ENCOUNTER — Other Ambulatory Visit: Payer: Self-pay | Admitting: Orthopedic Surgery

## 2017-04-01 ENCOUNTER — Encounter: Payer: Self-pay | Admitting: Family Medicine

## 2017-04-01 VITALS — BP 126/82 | HR 61 | Temp 98.2°F | Resp 12

## 2017-04-01 DIAGNOSIS — I739 Peripheral vascular disease, unspecified: Secondary | ICD-10-CM

## 2017-04-01 DIAGNOSIS — E1151 Type 2 diabetes mellitus with diabetic peripheral angiopathy without gangrene: Secondary | ICD-10-CM | POA: Diagnosis not present

## 2017-04-01 DIAGNOSIS — E890 Postprocedural hypothyroidism: Secondary | ICD-10-CM | POA: Diagnosis not present

## 2017-04-01 DIAGNOSIS — I96 Gangrene, not elsewhere classified: Secondary | ICD-10-CM

## 2017-04-01 LAB — TSH: TSH: 1.16 u[IU]/mL (ref 0.35–4.50)

## 2017-04-01 LAB — HEMOGLOBIN A1C: HEMOGLOBIN A1C: 6.1 % (ref 4.6–6.5)

## 2017-04-01 MED ORDER — FREESTYLE LIBRE SENSOR SYSTEM MISC: 1.0000 | Freq: Every day | 1 refills | Status: DC

## 2017-04-01 NOTE — Patient Instructions (Signed)
A few things to remember from today's visit:   Diabetes mellitus with peripheral vascular disease ([place]) - Plan: Hemoglobin A1c, Fructosamine, Continuous Blood Gluc Sensor ([place]) MISC  Hypothyroidism following radioiodine therapy - Plan: TSH  Peripheral vascular disease ([place])  Gangrene of finger of left hand ([place])  I will send Percocet to your pharmacy.  No changes today. Please keep appointment with hand specialist. Arrange appointment with your ophthalmologist. Try to decrease candy intake. And increase fluid intake.  Please be sure medication list is accurate. If a new problem present, please set up appointment sooner than planned today.

## 2017-04-01 NOTE — Assessment & Plan Note (Addendum)
Severe, complicated with bilateral LE amputation, right third finger amputation, and current left third finger gangrene. We discussed the importance of avoiding trauma. Good skin care recommended. Recommend checking skin a few times per week. He will continue following with vascular.

## 2017-04-01 NOTE — Assessment & Plan Note (Addendum)
Problem has been adequately controlled, goal < 8.0. Rx for Freestyle Libre glucose sensor, explained that his health insurance might not cover it, in which case we will have to stop monitoring BS's. For now no changes in dose of Glipizide. We discussed the importance of following dietary recommendations, I acknowledged that it is hard, giving all dietary restrictions due to other medical problems. Recommend scheduling appointment with his ophthalmologist. Further recommendations will be given according to Hg A1c results.

## 2017-04-01 NOTE — Progress Notes (Signed)
ACUTE VISIT   HPI:  Chief Complaint  Patient presents with  . Blister on left middle finger    happened 3 weeks ago while testing blood sugar, wants a better way of testing instead of using fingers    Jack [name] is a 72 y.o. male, who is here today with his caregiver, requesting a different device to check glucose at home. He has history of PVD, complicated with amputation of lower extremities and some fingers. About 2-3 weeks ago his caregiver did a fingerstick on third left finger, clear fluid came out but not blood, a few days later finger was erythematosus and has progressed to necrotic tissue. He has an appointment with hand surgeon today and according to patient, the plan is to amputate this finger. He is not having fever, chills, MS changes.  DM II: Currently he is on Glipizide 5 mg, he takes half tablet daily. He has no checked BS's in 2-3 weeks, he denies symptoms of hypoglycemia. According to caregiver, he has not been very consistent with dietary recommendations, he eats candy a few times per week. Denies abdominal pain, nausea,vomiting, polydipsia,or polyphagia.  History of diabetic retinopathy, he has not follow with ophthalmologist as recommended.    Lab Results  Component Value Date   HGBA1C 7.2 (H) 11/26/2016     ESRD, hemodialysis 3 times per week, next section is tomorrow.  His BP is in normal range, so frequently he needs IV fluids after hemodialysis. He states that he has not been thirsty, so he has not been drinking enough fluids.    History of chronic pain, phantom lower extremity pain. Currently he is on Percocet 5-325 mg twice daily as needed. He denies side effects from medications, no changes in bowel habits. He does not produce urine, so we have not done a urine tox screening.  Usually a prescription last about over a month.  Hypothyroidism: S/P radioiodine therapy. Currently he is on levothyroxine 150 mcg half tablet on  Mondays and 1 tablet the rest of the days.  Lab Results  Component Value Date   TSH 0.93 07/24/2016     Review of Systems  Constitutional: Positive for appetite change and fatigue. Negative for activity change and fever.  HENT: Negative for mouth sores, nosebleeds and sore throat.   Eyes: Negative for pain and redness.  Respiratory: Negative for cough, shortness of breath and wheezing.   Cardiovascular: Negative for chest pain, palpitations and leg swelling.  Gastrointestinal: Negative for abdominal pain, nausea and vomiting.       No changes in bowel habits.  Endocrine: Negative for cold intolerance, heat intolerance, polydipsia and polyphagia.  Skin: Positive for color change and wound.  Neurological: Negative for seizures, syncope and headaches.  Psychiatric/Behavioral: Negative for confusion. The patient is nervous/anxious.       Current Outpatient Medications on File Prior to Visit  Medication Sig Dispense Refill  . Ascorbic Acid (VITAMIN C PO) Take 600 mg by mouth daily.    Marland Kitchen aspirin EC 81 MG tablet Take 81 mg by mouth daily.    Marland Kitchen atorvastatin (LIPITOR) 10 MG tablet TAKE 1 TABLET (10 MG TOTAL) BY MOUTH DAILY. 90 tablet 1  . glipiZIDE (GLUCOTROL) 5 MG tablet TAKE 0.5 TABLETS (2.5 MG TOTAL) BY MOUTH DAILY BEFORE BREAKFAST. 45 tablet 2  . lanthanum (FOSRENOL) 1000 MG chewable tablet Chew 1,000 mg by mouth 2 (two) times daily.  6  . levothyroxine (SYNTHROID, LEVOTHROID) 150 MCG tablet TAKE 1/2 TABLET  BY MOUTH ON MONDAYS AND 1 TABLET BY MOUTH THE REST OF THE WEEK. 90 tablet 1  . multivitamin (RENA-VIT) TABS tablet Take 1 tablet by mouth 4 (four) times a week.     . ondansetron (ZOFRAN) 4 MG tablet TAKE 1 TABLET BY MOUTH EVERY TWELVE HOURS AS DIRECTED FOR NAUSEA/VOMITING.  0  . oxyCODONE-acetaminophen (ROXICET) 5-325 MG tablet Take 1 tablet every 12 (twelve) hours as needed by mouth for moderate pain or severe pain. 60 tablet 0   No current facility-administered medications on  file prior to visit.      Past Medical History:  Diagnosis Date  . Anemia   . Arthritis   . Asthma   . Contracture of joint    left knee  . Decubitus ulcer of sacral region, stage 2 03/07/2014  . Diabetes mellitus with peripheral vascular disease ([place])   . End-stage renal disease on hemodialysis [facility])    Started HD March 2014.  Cause of ESRD was DM.  Gets HD at [facility] on [address] on MWF schedule.   Marland Kitchen ESRD on hemodialysis ([place])   . Gangrene ([place])    right BKA  . Gangrene of foot ([place])   . Glaucoma   . Glaucoma 03/07/2014  . Heart murmur   . History of MRSA infection 04/22/2013   Bacteremia assoc w L foot wound infection Mar 2015   . Hyperparathyroidism, secondary renal ([place])   . Hypertension   . Hypothyroidism   . MRSA bacteremia   . Multiple myeloma, without mention of having achieved remission 03/30/2012   Cytogenetic neg on 03/23/2012.  Marland Kitchen Necrosis ([place])    left long finger  . Peripheral arterial disease ([place])   . Peripheral vascular disease, unspecified ([place]) 11/19/2012   In the past had R foot toe amps then R TMA. In 2015 had left foot toe amp > then TMA >then L BKA on 05/14/13   . Pneumonia    2012  . Thyroid disease    hyperparathyroidism   Allergies  Allergen Reactions  . Bee Venom Anaphylaxis  . Ivp Dye [Iodinated Diagnostic Agents] Hives  . Lisinopril Cough  . Morphine And Related Hives and Other (See Comments)    Bradycardia     Social History   Socioeconomic History  . Marital status: Divorced    Spouse name: Not on file  . Number of children: Not on file  . Years of education: Not on file  . Highest education level: Not on file  Occupational History  . Not on file  Social Needs  . Financial resource strain: Not on file  . Food insecurity:    Worry: Not on file    Inability: Not on file  . Transportation needs:    Medical: Not on file    Non-medical: Not on file  Tobacco Use  . Smoking status: Former Smoker    Types: Cigarettes    Last  attempt to quit: 03/19/1982    Years since quitting: 35.0  . Smokeless tobacco: Never Used  Substance and Sexual Activity  . Alcohol use: No    Alcohol/week: 0.0 oz    Comment: rare  . Drug use: No  . Sexual activity: Never  Lifestyle  . Physical activity:    Days per week: Not on file    Minutes per session: Not on file  . Stress: Not on file  Relationships  . Social connections:    Talks on phone: Not on file    Gets together: Not  on file    Attends religious service: Not on file    Active member of club or organization: Not on file    Attends meetings of clubs or organizations: Not on file    Relationship status: Not on file  Other Topics Concern  . Not on file  Social History Narrative  . Not on file    Vitals:   04/01/17 0831  BP: 126/82  Pulse: 61  Resp: 12  Temp: 98.2 F (36.8 C)  SpO2: 100%   There is no height or weight on file to calculate BMI.   Physical Exam  Nursing note and vitals reviewed. Constitutional: He is oriented to person, place, and time. He appears well-developed. No distress.  HENT:  Head: Atraumatic.  Mouth/Throat: Oropharynx is clear and moist and mucous membranes are normal.  Eyes: Conjunctivae are normal. Pupils are unequal.  Cardiovascular: Normal rate. An irregular rhythm present.  Murmur heard. Left radial and ulnar pulses present. Abnormal capillary refill.   Respiratory: Effort normal and breath sounds normal. No respiratory distress.  GI: Soft. There is no hepatomegaly. There is no tenderness.  Musculoskeletal: He exhibits no edema.  Muscle atrophy hand, bilateral.   Neurological: He is alert and oriented to person, place, and time.  He is in his wheel chair.  Skin: Skin is warm. Lesion noted. There is erythema.  3rd left finger with necrotic tissue that extends from finger tip to area right under DIP.   Psychiatric: He has a normal mood and affect. Cognition and memory are normal.  Well groomed, good eye contact.      ASSESSMENT AND PLAN:   Jack [name] was seen today for blister on left middle finger.  Orders Placed This Encounter  Procedures  . Hemoglobin A1c  . TSH  . Fructosamine    Lab Results  Component Value Date   HGBA1C 6.1 04/01/2017   Lab Results  Component Value Date   TSH 1.16 04/01/2017    Gangrene of finger of left hand ([place])  He wonders if there is any treatment/procedure to improve "circulation" in his finger in order to avoid amputation.  Explained that that is point I do not think conservative treatments will help at all. We discussed some measures that may help him to prevent similar problem with other fingers. He has an appointment today with hand surgeon, finger will need to be amputated.   Peripheral vascular disease ([facility]) Severe, complicated with bilateral LE amputation, right third finger amputation, and current left third finger gangrene. We discussed the importance of avoiding trauma. Good skin care recommended. Recommend checking skin a few times per week. He will continue following with vascular.   Hypothyroidism following radioiodine therapy No changes in Levothyroxine dose. Further recommendations will be given according to lab results. Follow-up in 6-12 months.  Diabetes mellitus with peripheral vascular disease ([place]) Problem has been adequately controlled, goal < 8.0. Rx for Freestyle Libre glucose sensor, explained that his health insurance might not cover it, in which case we will have to stop monitoring BS's. For now no changes in dose of Glipizide. We discussed the importance of following dietary recommendations, I acknowledged that it is hard, giving all dietary restrictions due to other medical problems. Recommend scheduling appointment with his ophthalmologist. Further recommendations will be given according to Hg A1c results.      Return in about 4 months (around 08/01/2017) for DM,pain.        Betty G. Martinique, MD  [facility]. [place]  office.

## 2017-04-01 NOTE — Assessment & Plan Note (Signed)
No changes in Levothyroxine dose. Further recommendations will be given according to lab results. Follow-up in 6-12 months.

## 2017-04-02 DIAGNOSIS — D689 Coagulation defect, unspecified: Secondary | ICD-10-CM | POA: Diagnosis not present

## 2017-04-02 DIAGNOSIS — D631 Anemia in chronic kidney disease: Secondary | ICD-10-CM | POA: Diagnosis not present

## 2017-04-02 DIAGNOSIS — R509 Fever, unspecified: Secondary | ICD-10-CM | POA: Diagnosis not present

## 2017-04-02 DIAGNOSIS — N2581 Secondary hyperparathyroidism of renal origin: Secondary | ICD-10-CM | POA: Diagnosis not present

## 2017-04-02 DIAGNOSIS — N186 End stage renal disease: Secondary | ICD-10-CM | POA: Diagnosis not present

## 2017-04-02 DIAGNOSIS — E1029 Type 1 diabetes mellitus with other diabetic kidney complication: Secondary | ICD-10-CM | POA: Diagnosis not present

## 2017-04-02 DIAGNOSIS — Z992 Dependence on renal dialysis: Secondary | ICD-10-CM | POA: Diagnosis not present

## 2017-04-02 DIAGNOSIS — T82598A Other mechanical complication of other cardiac and vascular devices and implants, initial encounter: Secondary | ICD-10-CM | POA: Diagnosis not present

## 2017-04-02 DIAGNOSIS — M869 Osteomyelitis, unspecified: Secondary | ICD-10-CM | POA: Diagnosis not present

## 2017-04-03 ENCOUNTER — Other Ambulatory Visit: Payer: Self-pay

## 2017-04-03 ENCOUNTER — Encounter (HOSPITAL_COMMUNITY): Payer: Self-pay | Admitting: *Deleted

## 2017-04-03 LAB — FRUCTOSAMINE: Fructosamine: 333 umol/L — ABNORMAL HIGH (ref 190–270)

## 2017-04-03 NOTE — Progress Notes (Signed)
  Pt denies any acute cardiopulmonary issues. Pt denies being under the care of a cardiologist.  Pt stated that a stress test was performed 10 years ago and a cardiac cath 25 years ago. Pt denies having a chest x ray within the last year. Pt made aware to stop taking otc vitamins, fish oil and herbal medications. Do not take any NSAIDs ie: Ibuprofen, Advil, Naproxen (Aleve), Motrin, BC and Goody Powder. Pt made aware to not take Glipizide the DOS. Pt made aware to check BG every 2 hours prior to arrival to hospital on DOS. Pt made aware to treat a BG < 70 with 4 ounces of apple juice, wait 15 minutes after intervention to recheck BG, if BG remains < 70, call Short Stay unit to speak with a nurse. Pt verbalized understanding of all pre-op instructions.

## 2017-04-04 ENCOUNTER — Encounter (HOSPITAL_COMMUNITY)
Admission: RE | Disposition: A | Discharge status: Home / Self Care | Payer: Self-pay | Source: Ambulatory Visit | Attending: Orthopedic Surgery

## 2017-04-04 ENCOUNTER — Ambulatory Visit (HOSPITAL_COMMUNITY): Payer: Medicare Other | Admitting: Anesthesiology

## 2017-04-04 ENCOUNTER — Ambulatory Visit (HOSPITAL_COMMUNITY)
Admission: RE | Admit: 2017-04-04 | Discharge: 2017-04-04 | Disposition: A | Discharge status: Home / Self Care | Payer: Medicare Other | Source: Ambulatory Visit | Attending: Orthopedic Surgery | Admitting: Orthopedic Surgery

## 2017-04-04 ENCOUNTER — Encounter (HOSPITAL_COMMUNITY): Payer: Self-pay | Admitting: Urology

## 2017-04-04 DIAGNOSIS — Z833 Family history of diabetes mellitus: Secondary | ICD-10-CM | POA: Insufficient documentation

## 2017-04-04 DIAGNOSIS — E890 Postprocedural hypothyroidism: Secondary | ICD-10-CM | POA: Insufficient documentation

## 2017-04-04 DIAGNOSIS — Z89511 Acquired absence of right leg below knee: Secondary | ICD-10-CM | POA: Insufficient documentation

## 2017-04-04 DIAGNOSIS — D631 Anemia in chronic kidney disease: Secondary | ICD-10-CM | POA: Insufficient documentation

## 2017-04-04 DIAGNOSIS — J45909 Unspecified asthma, uncomplicated: Secondary | ICD-10-CM | POA: Insufficient documentation

## 2017-04-04 DIAGNOSIS — M8610 Other acute osteomyelitis, unspecified site: Secondary | ICD-10-CM | POA: Diagnosis not present

## 2017-04-04 DIAGNOSIS — Z89512 Acquired absence of left leg below knee: Secondary | ICD-10-CM | POA: Diagnosis not present

## 2017-04-04 DIAGNOSIS — Z89432 Acquired absence of left foot: Secondary | ICD-10-CM | POA: Insufficient documentation

## 2017-04-04 DIAGNOSIS — H409 Unspecified glaucoma: Secondary | ICD-10-CM | POA: Diagnosis not present

## 2017-04-04 DIAGNOSIS — Z992 Dependence on renal dialysis: Secondary | ICD-10-CM | POA: Diagnosis not present

## 2017-04-04 DIAGNOSIS — Z91030 Bee allergy status: Secondary | ICD-10-CM | POA: Insufficient documentation

## 2017-04-04 DIAGNOSIS — Z89412 Acquired absence of left great toe: Secondary | ICD-10-CM | POA: Diagnosis not present

## 2017-04-04 DIAGNOSIS — S61203A Unspecified open wound of left middle finger without damage to nail, initial encounter: Secondary | ICD-10-CM | POA: Insufficient documentation

## 2017-04-04 DIAGNOSIS — N186 End stage renal disease: Secondary | ICD-10-CM | POA: Insufficient documentation

## 2017-04-04 DIAGNOSIS — Z8614 Personal history of Methicillin resistant Staphylococcus aureus infection: Secondary | ICD-10-CM | POA: Insufficient documentation

## 2017-04-04 DIAGNOSIS — I96 Gangrene, not elsewhere classified: Secondary | ICD-10-CM | POA: Diagnosis not present

## 2017-04-04 DIAGNOSIS — X58XXXA Exposure to other specified factors, initial encounter: Secondary | ICD-10-CM | POA: Diagnosis not present

## 2017-04-04 DIAGNOSIS — I120 Hypertensive chronic kidney disease with stage 5 chronic kidney disease or end stage renal disease: Secondary | ICD-10-CM | POA: Diagnosis not present

## 2017-04-04 DIAGNOSIS — Z7982 Long term (current) use of aspirin: Secondary | ICD-10-CM | POA: Insufficient documentation

## 2017-04-04 DIAGNOSIS — C9000 Multiple myeloma not having achieved remission: Secondary | ICD-10-CM | POA: Insufficient documentation

## 2017-04-04 DIAGNOSIS — Z808 Family history of malignant neoplasm of other organs or systems: Secondary | ICD-10-CM | POA: Insufficient documentation

## 2017-04-04 DIAGNOSIS — E1122 Type 2 diabetes mellitus with diabetic chronic kidney disease: Secondary | ICD-10-CM | POA: Diagnosis not present

## 2017-04-04 DIAGNOSIS — Z9841 Cataract extraction status, right eye: Secondary | ICD-10-CM | POA: Insufficient documentation

## 2017-04-04 DIAGNOSIS — Z89421 Acquired absence of other right toe(s): Secondary | ICD-10-CM | POA: Diagnosis not present

## 2017-04-04 DIAGNOSIS — Z7984 Long term (current) use of oral hypoglycemic drugs: Secondary | ICD-10-CM | POA: Diagnosis not present

## 2017-04-04 DIAGNOSIS — M86142 Other acute osteomyelitis, left hand: Secondary | ICD-10-CM | POA: Diagnosis not present

## 2017-04-04 DIAGNOSIS — Z885 Allergy status to narcotic agent status: Secondary | ICD-10-CM | POA: Insufficient documentation

## 2017-04-04 DIAGNOSIS — Z9842 Cataract extraction status, left eye: Secondary | ICD-10-CM | POA: Insufficient documentation

## 2017-04-04 DIAGNOSIS — N2581 Secondary hyperparathyroidism of renal origin: Secondary | ICD-10-CM | POA: Insufficient documentation

## 2017-04-04 DIAGNOSIS — Z888 Allergy status to other drugs, medicaments and biological substances status: Secondary | ICD-10-CM | POA: Insufficient documentation

## 2017-04-04 DIAGNOSIS — E1152 Type 2 diabetes mellitus with diabetic peripheral angiopathy with gangrene: Secondary | ICD-10-CM | POA: Insufficient documentation

## 2017-04-04 DIAGNOSIS — Z79899 Other long term (current) drug therapy: Secondary | ICD-10-CM | POA: Diagnosis not present

## 2017-04-04 DIAGNOSIS — Z841 Family history of disorders of kidney and ureter: Secondary | ICD-10-CM | POA: Insufficient documentation

## 2017-04-04 DIAGNOSIS — Z89021 Acquired absence of right finger(s): Secondary | ICD-10-CM | POA: Insufficient documentation

## 2017-04-04 DIAGNOSIS — R011 Cardiac murmur, unspecified: Secondary | ICD-10-CM | POA: Diagnosis not present

## 2017-04-04 DIAGNOSIS — Z89411 Acquired absence of right great toe: Secondary | ICD-10-CM | POA: Insufficient documentation

## 2017-04-04 DIAGNOSIS — Z87891 Personal history of nicotine dependence: Secondary | ICD-10-CM | POA: Insufficient documentation

## 2017-04-04 DIAGNOSIS — Z91041 Radiographic dye allergy status: Secondary | ICD-10-CM | POA: Insufficient documentation

## 2017-04-04 HISTORY — PX: AMPUTATION: SHX166

## 2017-04-04 HISTORY — DX: Gangrene, not elsewhere classified: I96

## 2017-04-04 LAB — GLUCOSE, CAPILLARY
GLUCOSE-CAPILLARY: 48 mg/dL — AB (ref 65–99)
GLUCOSE-CAPILLARY: 145 mg/dL — AB (ref 65–99)
Glucose-Capillary: 96 mg/dL (ref 65–99)
Glucose-Capillary: 69 mg/dL (ref 65–99)
Glucose-Capillary: 89 mg/dL (ref 65–99)

## 2017-04-04 LAB — BASIC METABOLIC PANEL
ANION GAP: 14 (ref 5–15)
BUN: 29 mg/dL — ABNORMAL HIGH (ref 6–20)
CALCIUM: 9.1 mg/dL (ref 8.9–10.3)
CHLORIDE: 99 mmol/L — AB (ref 101–111)
CO2: 24 mmol/L (ref 22–32)
Creatinine, Ser: 6.31 mg/dL — ABNORMAL HIGH (ref 0.61–1.24)
GFR calc non Af Amer: 8 mL/min — ABNORMAL LOW (ref >60)
GFR, EST AFRICAN AMERICAN: 9 mL/min — AB (ref >60)
GLUCOSE: 102 mg/dL — AB (ref 65–99)
POTASSIUM: 4.2 mmol/L (ref 3.5–5.1)
Sodium: 137 mmol/L (ref 135–145)

## 2017-04-04 LAB — CBC
HCT: 31.1 % — ABNORMAL LOW (ref 39.0–52.0)
HEMOGLOBIN: 9.4 g/dL — AB (ref 13.0–17.0)
MCH: 26.3 pg (ref 26.0–34.0)
MCHC: 30.2 g/dL (ref 30.0–36.0)
MCV: 87.1 fL (ref 78.0–100.0)
Platelets: 366 10*3/uL (ref 150–400)
RBC: 3.57 MIL/uL — ABNORMAL LOW (ref 4.22–5.81)
RDW: 14.7 % (ref 11.5–15.5)
WBC: 7 10*3/uL (ref 4.0–10.5)

## 2017-04-04 LAB — SURGICAL PCR SCREEN
MRSA, PCR: NEGATIVE
STAPHYLOCOCCUS AUREUS: NEGATIVE

## 2017-04-04 SURGERY — AMPUTATION DIGIT
Anesthesia: General | Site: Finger | Laterality: Left

## 2017-04-04 MED ORDER — FENTANYL CITRATE (PF) 250 MCG/5ML IJ SOLN
INTRAMUSCULAR | Status: AC
  Filled 2017-04-04: qty 5

## 2017-04-04 MED ORDER — OXYCODONE-ACETAMINOPHEN 5-325 MG PO TABS: ORAL_TABLET | ORAL | 0 refills | Status: DC

## 2017-04-04 MED ORDER — FENTANYL CITRATE (PF) 100 MCG/2ML IJ SOLN
INTRAMUSCULAR | Status: DC | PRN
  Administered 2017-04-04: 50 ug via INTRAVENOUS
  Administered 2017-04-04 (×3): 25 ug via INTRAVENOUS

## 2017-04-04 MED ORDER — FENTANYL CITRATE (PF) 100 MCG/2ML IJ SOLN: 25.0000 ug | INTRAMUSCULAR | Status: DC | PRN

## 2017-04-04 MED ORDER — PROPOFOL 10 MG/ML IV BOLUS
INTRAVENOUS | Status: AC
  Filled 2017-04-04: qty 20

## 2017-04-04 MED ORDER — CEFAZOLIN SODIUM-DEXTROSE 2-4 GM/100ML-% IV SOLN
INTRAVENOUS | Status: AC
  Filled 2017-04-04: qty 100

## 2017-04-04 MED ORDER — PROMETHAZINE HCL 25 MG/ML IJ SOLN: 6.2500 mg | INTRAMUSCULAR | Status: DC | PRN

## 2017-04-04 MED ORDER — LIDOCAINE HCL (CARDIAC) 20 MG/ML IV SOLN
INTRAVENOUS | Status: AC
  Filled 2017-04-04: qty 5

## 2017-04-04 MED ORDER — PROPOFOL 10 MG/ML IV BOLUS
INTRAVENOUS | Status: DC | PRN
  Administered 2017-04-04: 100 mg via INTRAVENOUS

## 2017-04-04 MED ORDER — SODIUM CHLORIDE 0.9 % IV SOLN
INTRAVENOUS | Status: DC
  Administered 2017-04-04 (×2): via INTRAVENOUS

## 2017-04-04 MED ORDER — CEFAZOLIN SODIUM-DEXTROSE 2-4 GM/100ML-% IV SOLN
2.0000 g | INTRAVENOUS | Status: AC
  Administered 2017-04-04: 2 g via INTRAVENOUS

## 2017-04-04 MED ORDER — ONDANSETRON HCL 4 MG/2ML IJ SOLN
INTRAMUSCULAR | Status: DC | PRN
  Administered 2017-04-04: 4 mg via INTRAVENOUS

## 2017-04-04 MED ORDER — ONDANSETRON HCL 4 MG/2ML IJ SOLN
INTRAMUSCULAR | Status: AC
  Filled 2017-04-04: qty 2

## 2017-04-04 MED ORDER — BUPIVACAINE HCL (PF) 0.25 % IJ SOLN
INTRAMUSCULAR | Status: AC
  Filled 2017-04-04: qty 30

## 2017-04-04 MED ORDER — DEXTROSE 50 % IV SOLN
INTRAVENOUS | Status: AC
  Administered 2017-04-04: 50 mL via INTRAVENOUS
  Filled 2017-04-04: qty 50

## 2017-04-04 MED ORDER — EPHEDRINE SULFATE 50 MG/ML IJ SOLN
INTRAMUSCULAR | Status: DC | PRN
  Administered 2017-04-04 (×5): 10 mg via INTRAVENOUS

## 2017-04-04 MED ORDER — BUPIVACAINE HCL 0.25 % IJ SOLN
INTRAMUSCULAR | Status: DC | PRN
  Administered 2017-04-04: 5 mL

## 2017-04-04 MED ORDER — LACTATED RINGERS IV SOLN
INTRAVENOUS | Status: DC | PRN
  Administered 2017-04-04: 14:00:00 via INTRAVENOUS

## 2017-04-04 MED ORDER — LIDOCAINE HCL (CARDIAC) 20 MG/ML IV SOLN
INTRAVENOUS | Status: DC | PRN
  Administered 2017-04-04: 100 mg via INTRAVENOUS

## 2017-04-04 MED ORDER — DEXTROSE 50 % IV SOLN
1.0000 | Freq: Once | INTRAVENOUS | Status: AC
  Administered 2017-04-04: 50 mL via INTRAVENOUS

## 2017-04-04 MED ORDER — 0.9 % SODIUM CHLORIDE (POUR BTL) OPTIME
TOPICAL | Status: DC | PRN
  Administered 2017-04-04: 1000 mL

## 2017-04-04 MED ORDER — CHLORHEXIDINE GLUCONATE 4 % EX LIQD: 60.0000 mL | Freq: Once | CUTANEOUS | Status: DC

## 2017-04-04 SURGICAL SUPPLY — 35 items
BLADE LONG MED 31MMX9MM (MISCELLANEOUS) ×1
BLADE LONG MED 31X9 (MISCELLANEOUS) ×2 IMPLANT
BNDG COHESIVE 1X5 TAN STRL LF (GAUZE/BANDAGES/DRESSINGS) ×3 IMPLANT
BNDG ESMARK 4X9 LF (GAUZE/BANDAGES/DRESSINGS) ×3 IMPLANT
BNDG GAUZE ELAST 4 BULKY (GAUZE/BANDAGES/DRESSINGS) IMPLANT
CORDS BIPOLAR (ELECTRODE) ×3 IMPLANT
CUFF TOURNIQUET SINGLE 18IN (TOURNIQUET CUFF) ×3 IMPLANT
GAUZE SPONGE 4X4 12PLY STRL (GAUZE/BANDAGES/DRESSINGS) ×3 IMPLANT
GAUZE XEROFORM 1X8 LF (GAUZE/BANDAGES/DRESSINGS) ×3 IMPLANT
GLOVE BIO SURGEON STRL SZ7.5 (GLOVE) ×3 IMPLANT
GLOVE BIOGEL PI IND STRL 8 (GLOVE) ×1 IMPLANT
GLOVE BIOGEL PI INDICATOR 8 (GLOVE) ×2
GOWN STRL REUS W/ TWL LRG LVL3 (GOWN DISPOSABLE) ×1 IMPLANT
GOWN STRL REUS W/ TWL XL LVL3 (GOWN DISPOSABLE) ×1 IMPLANT
GOWN STRL REUS W/TWL LRG LVL3 (GOWN DISPOSABLE) ×2
GOWN STRL REUS W/TWL XL LVL3 (GOWN DISPOSABLE) ×2
KIT BASIN OR (CUSTOM PROCEDURE TRAY) ×3 IMPLANT
KIT ROOM TURNOVER OR (KITS) ×3 IMPLANT
NEEDLE HYPO 25GX1X1/2 BEV (NEEDLE) ×3 IMPLANT
NS IRRIG 1000ML POUR BTL (IV SOLUTION) ×3 IMPLANT
PACK ORTHO EXTREMITY (CUSTOM PROCEDURE TRAY) ×3 IMPLANT
PAD ARMBOARD 7.5X6 YLW CONV (MISCELLANEOUS) ×6 IMPLANT
PAD CAST 4YDX4 CTTN HI CHSV (CAST SUPPLIES) ×1 IMPLANT
PADDING CAST COTTON 4X4 STRL (CAST SUPPLIES) ×2
SCRUB POVIDONE IODINE 4 OZ (MISCELLANEOUS) ×3 IMPLANT
SOL PREP POV-IOD 4OZ 10% (MISCELLANEOUS) ×3 IMPLANT
SUT CHROMIC 6 0 PS 4 (SUTURE) IMPLANT
SUT MON AB 5-0 P3 18 (SUTURE) ×3 IMPLANT
SUT MON AB 5-0 PS2 18 (SUTURE) IMPLANT
SUT VIC AB 4-0 P-3 18X BRD (SUTURE) ×1 IMPLANT
SUT VIC AB 4-0 P3 18 (SUTURE) ×2
SUT VICRYL 4-0 PS2 18IN ABS (SUTURE) IMPLANT
SYR CONTROL 10ML LL (SYRINGE) ×3 IMPLANT
TOWEL OR 17X26 10 PK STRL BLUE (TOWEL DISPOSABLE) ×3 IMPLANT
UNDERPAD 30X30 (UNDERPADS AND DIAPERS) ×3 IMPLANT

## 2017-04-04 NOTE — Anesthesia Postprocedure Evaluation (Signed)
Anesthesia Post Note  Patient: Jack [name]  Procedure(s) Performed: AMPUTATION DIGIT LEFT LONG FINGER (Left Finger)     Patient location during evaluation: PACU Anesthesia Type: General Level of consciousness: awake and alert Pain management: pain level controlled Vital Signs Assessment: post-procedure vital signs reviewed and stable Respiratory status: spontaneous breathing, nonlabored ventilation, respiratory function stable and patient connected to nasal cannula oxygen Cardiovascular status: blood pressure returned to baseline and stable Postop Assessment: no apparent nausea or vomiting Anesthetic complications: no    Last Vitals:  Vitals:   04/04/17 1710 04/04/17 1720  BP: 127/76 137/80  Pulse: 74 75  Resp: 16 12  Temp:  36.5 C  SpO2: 92% 93%    Last Pain:  Vitals:   04/04/17 1720  TempSrc:   PainSc: 0-No pain                 Deshunda Thackston DAVID

## 2017-04-04 NOTE — Progress Notes (Signed)
Patient's blood sugar 48.  Dr. Ola Spurr notified and received verbal order to give 1 amp of D50.  Will continue to monitor patient.

## 2017-04-04 NOTE — H&P (Signed)
Jack [name] is an 72 y.o. male.   Chief Complaint: left long finger necrosis HPI: 72 yo male with necrosis left long finger after blood sugar stick.  Has had previous right long finger amputation for necrosis and non healing wound.  He wishes to proceed with amputation left long finger.  Allergies:  Allergies  Allergen Reactions  . Bee Venom Anaphylaxis  . Ivp Dye [Iodinated Diagnostic Agents] Hives  . Lisinopril Cough  . Morphine And Related Hives and Other (See Comments)    Bradycardia     Past Medical History:  Diagnosis Date  . Anemia   . Arthritis   . Asthma   . Contracture of joint    left knee  . Decubitus ulcer of sacral region, stage 2 03/07/2014  . Diabetes mellitus with peripheral vascular disease ([place])   . End-stage renal disease on hemodialysis [facility]    Started HD March 2014.  Cause of ESRD was DM.  Gets HD at [facility] on [address] on MWF schedule.   Marland Kitchen ESRD on hemodialysis ([place])   . Gangrene ([place])    right BKA  . Gangrene of foot ([place])   . Glaucoma   . Glaucoma 03/07/2014  . Heart murmur   . History of MRSA infection 04/22/2013   Bacteremia assoc w L foot wound infection Mar 2015   . Hyperparathyroidism, secondary renal ([place])   . Hypertension   . Hypothyroidism   . MRSA bacteremia   . Multiple myeloma, without mention of having achieved remission 03/30/2012   Cytogenetic neg on 03/23/2012.  Marland Kitchen Necrosis ([place])    left long finger  . Peripheral arterial disease ([place])   . Peripheral vascular disease, unspecified ([place]) 11/19/2012   In the past had R foot toe amps then R TMA. In 2015 had left foot toe amp > then TMA >then L BKA on 05/14/13   . Pneumonia    2012  . Thyroid disease    hyperparathyroidism    Past Surgical History:  Procedure Laterality Date  . ABDOMINAL AORTAGRAM Bilateral 11/06/2012   Procedure: ABDOMINAL AORTAGRAM;  Surgeon: Elam Dutch, MD;  Location: [facility] CATH LAB;  Service: Cardiovascular;  Laterality: Bilateral;  . AMPUTATION  Right 11/10/2012   Procedure: AMPUTATION FIRST and SECOND TOES Right Foot;  Surgeon: Elam Dutch, MD;  Location: [place];  Service: Vascular;  Laterality: Right;  . AMPUTATION Left 04/07/2013   Procedure: AMPUTATION DIGIT- LEFT 1ST TOE;  Surgeon: Mal Misty, MD;  Location: [place];  Service: Vascular;  Laterality: Left;  . AMPUTATION Left 04/26/2013   Procedure: Left Foot Transmetatarsal Amputation;  Surgeon: Newt Minion, MD;  Location: [place];  Service: Orthopedics;  Laterality: Left;  . AMPUTATION Left 05/14/2013   Procedure: AMPUTATION BELOW KNEE;  Surgeon: Newt Minion, MD;  Location: [place];  Service: Orthopedics;  Laterality: Left;  Left Below Knee Amputation  . AMPUTATION Left 07/23/2013   Procedure: AMPUTATION BELOW KNEE;  Surgeon: Newt Minion, MD;  Location: [place];  Service: Orthopedics;  Laterality: Left;  Left Below Knee Amputation Revision  . AMPUTATION Right 02/18/2014   Procedure: AMPUTATION BELOW KNEE;  Surgeon: Newt Minion, MD;  Location: [place];  Service: Orthopedics;  Laterality: Right;  . AMPUTATION Right 08/20/2016   Procedure: RIGHT LONG FINGER AMPUTATION;  Surgeon: Leanora Cover, MD;  Location: [place];  Service: Orthopedics;  Laterality: Right;  . AMPUTATION Right 01/11/2017   Procedure: RIGHT LONG FINGER AMPUTATION;  Surgeon: Fredna Dow,  Lennette Bihari, MD;  Location: [place];  Service: Orthopedics;  Laterality: Right;  . AV FISTULA PLACEMENT Left 03/25/2012   Procedure: ARTERIOVENOUS (AV) FISTULA CREATION;  Surgeon: Mal Misty, MD;  Location: [place];  Service: Vascular;  Laterality: Left;  . BELOW KNEE LEG AMPUTATION Left 05/14/2013   DR [name]  . BELOW KNEE LEG AMPUTATION Right 02/18/2014   dr duda  . CARDIAC CATHETERIZATION     approx 30 years ago  . CERVICAL DISC SURGERY    . EYE SURGERY Bilateral    CATARACTS  . I&D EXTREMITY Left 04/22/2013   Procedure: IRRIGATION AND DEBRIDEMENT LEFT FIRST TOE AMPUTATION WOUND ;  Surgeon: Mal Misty, MD;  Location: [place];   Service: Vascular;  Laterality: Left;  . I&D EXTREMITY Right 01/05/2014   Procedure: IRRIGATION AND DEBRIDEMENT Right Heel Ulcer;  Surgeon: Mcarthur Rossetti, MD;  Location: [place];  Service: Orthopedics;  Laterality: Right;  Surgeon Available after 5PM  . INSERTION OF DIALYSIS CATHETER Right 03/19/2012   Procedure: INSERTION OF DIALYSIS CATHETER;  Surgeon: Mal Misty, MD;  Location: [place];  Service: Vascular;  Laterality: Right;  Right Internal Jugular  . LIGATION OF COMPETING BRANCHES OF ARTERIOVENOUS FISTULA Left 05/08/2012   Procedure: LIGATION OF COMPETING BRANCHES OF ARTERIOVENOUS FISTULA;  Surgeon: Mal Misty, MD;  Location: [place];  Service: Vascular;  Laterality: Left;  Ultrasound guided  . REPAIR QUADRICEPS/HAMSTRING MUSCLES Left 05/20/2014   Procedure: Left Hamstring Release;  Surgeon: Newt Minion, MD;  Location: [place];  Service: Orthopedics;  Laterality: Left;  . REVISON OF ARTERIOVENOUS FISTULA Left 01/30/2016   Procedure: CEPHALIC VEIN TURNDOWN TO LEFT UPPER ARM;  Surgeon: Elam Dutch, MD;  Location: [place];  Service: Vascular;  Laterality: Left;  . STUMP REVISION Right 05/20/2014   Procedure: Revision Right Below Knee Amputation;  Surgeon: Newt Minion, MD;  Location[place];  Service: Orthopedics;  Laterality: Right;  . TEE WITHOUT CARDIOVERSION N/A 04/20/2013   Procedure: TRANSESOPHAGEAL ECHOCARDIOGRAM (TEE);  Surgeon: Josue Hector, MD;  Location: [facility] ENDOSCOPY;  Service: Cardiovascular;  Laterality: N/A;  . THYROIDECTOMY    . TOE AMPUTATION     D/C 04-30-13  . TRANSMETATARSAL AMPUTATION Left 12/16/2012   Procedure: TRANSMETATARSAL AMPUTATION AND VAC PLACEMENT;  Surgeon: Elam Dutch, MD;  Location: [facility] OR;  Service: Vascular;  Laterality: Left;    Family History: Family History  Problem Relation Age of Onset  . Diabetes Mother   . Cancer Mother        bone   . Kidney disease Father     Social History:   reports that he quit smoking about 35 years ago. His  smoking use included cigarettes. He has never used smokeless tobacco. He reports that he does not drink alcohol or use drugs.  Medications: Medications Prior to Admission  Medication Sig Dispense Refill  . Ascorbic Acid (VITAMIN C PO) Take 600 mg by mouth daily.    Marland Kitchen aspirin EC 81 MG tablet Take 81 mg by mouth daily.    Marland Kitchen atorvastatin (LIPITOR) 10 MG tablet TAKE 1 TABLET (10 MG TOTAL) BY MOUTH DAILY. 90 tablet 1  . glipiZIDE (GLUCOTROL) 5 MG tablet TAKE 0.5 TABLETS (2.5 MG TOTAL) BY MOUTH DAILY BEFORE BREAKFAST. 45 tablet 2  . lanthanum (FOSRENOL) 1000 MG chewable tablet Chew 1,000 mg by mouth 2 (two) times daily.  6  . levothyroxine (SYNTHROID, LEVOTHROID) 150 MCG tablet TAKE 1/2 TABLET BY MOUTH ON MONDAYS AND 1  TABLET BY MOUTH THE REST OF THE WEEK. 90 tablet 1  . oxyCODONE-acetaminophen (ROXICET) 5-325 MG tablet Take 1 tablet every 12 (twelve) hours as needed by mouth for moderate pain or severe pain. 60 tablet 0  . Continuous Blood Gluc Sensor (FREESTYLE LIBRE SENSOR SYSTEM) MISC 1 Device by Does not apply route daily. 1 each 1  . multivitamin (RENA-VIT) TABS tablet Take 1 tablet by mouth 4 (four) times a week.     . ondansetron (ZOFRAN) 4 MG tablet TAKE 1 TABLET BY MOUTH EVERY TWELVE HOURS AS DIRECTED FOR NAUSEA/VOMITING.  0    Results for orders placed or performed during the hospital encounter of 04/04/17 (from the past 48 hour(s))  Glucose, capillary     Status: None   Collection Time: 04/04/17 10:54 AM  Result Value Ref Range   Glucose-Capillary 96 65 - 99 mg/dL  CBC     Status: Abnormal   Collection Time: 04/04/17 11:30 AM  Result Value Ref Range   WBC 7.0 4.0 - 10.5 K/uL   RBC 3.57 (L) 4.22 - 5.81 MIL/uL   Hemoglobin 9.4 (L) 13.0 - 17.0 g/dL   HCT 31.1 (L) 39.0 - 52.0 %   MCV 87.1 78.0 - 100.0 fL   MCH 26.3 26.0 - 34.0 pg   MCHC 30.2 30.0 - 36.0 g/dL   RDW 14.7 11.5 - 15.5 %   Platelets 366 150 - 400 K/uL    Comment: Performed at [facility], [place] [address]  [place], [place] [address]  Basic metabolic panel     Status: Abnormal   Collection Time: 04/04/17 11:30 AM  Result Value Ref Range   Sodium 137 135 - 145 mmol/L   Potassium 4.2 3.5 - 5.1 mmol/L   Chloride 99 (L) 101 - 111 mmol/L   CO2 24 22 - 32 mmol/L   Glucose, Bld 102 (H) 65 - 99 mg/dL   BUN 29 (H) 6 - 20 mg/dL   Creatinine, Ser 6.31 (H) 0.61 - 1.24 mg/dL   Calcium 9.1 8.9 - 10.3 mg/dL   GFR calc non Af Amer 8 (L) >60 mL/min   GFR calc Af Amer 9 (L) >60 mL/min    Comment: (NOTE) The eGFR has been calculated using the CKD EPI equation. This calculation has not been validated in all clinical situations. eGFR's persistently <60 mL/min signify possible Chronic Kidney Disease.    Anion gap 14 5 - 15    Comment: Performed at [facility] [address], [place], [place] [address]    No results found.   A comprehensive review of systems was negative.  Blood pressure (!) 172/77, pulse 71, temperature 98.2 F (36.8 C), temperature source Oral, resp. rate 18, height _0  (1.778 m), weight 72.6 kg (160 lb), SpO2 100 %.  General appearance: alert, cooperative and appears stated age Head: Normocephalic, without obvious abnormality, atraumatic Neck: supple, symmetrical, trachea midline Cardio: regular rate and rhythm Resp: clear to auscultation bilaterally Extremities: Intact sensation and capillary refill all digits.  +epl/fpl/io.  Left long finger with necrosing wound radially. Pulses: 2+ and symmetric Skin: Skin color, texture, turgor normal. No rashes or lesions Neurologic: Grossly normal Incision/Wound: as above  Assessment/Plan Left long finger necrosis.  Non operative and operative treatment options were discussed with the patient and patient wishes to proceed with operative treatment. Risks, benefits, and alternatives of surgery were discussed and the patient agrees with the plan of care.   Mckaylah Bettendorf R 04/04/2017, 12:52 PM

## 2017-04-04 NOTE — Discharge Instructions (Addendum)

## 2017-04-04 NOTE — Anesthesia Preprocedure Evaluation (Addendum)
Anesthesia Evaluation  Patient identified by MRN, date of birth, ID band Patient awake    Reviewed: Allergy & Precautions, NPO status , Patient's Chart, lab work & pertinent test results  Airway Mallampati: II  TM Distance: >3 FB Neck ROM: Full    Dental  (+) Dental Advisory Given   Pulmonary former smoker,    breath sounds clear to auscultation       Cardiovascular hypertension, + Peripheral Vascular Disease (s/p BKA)   Rhythm:Regular Rate:Normal  '16 ECHO: EF 60-65%, valves OK   Neuro/Psych    GI/Hepatic   Endo/Other  diabetes (glu 96), Oral Hypoglycemic AgentsHypothyroidism   Renal/GU ESRF and DialysisRenal disease (MWF)     Musculoskeletal   Abdominal   Peds  Hematology  (+) Blood dyscrasia (Hb 9.4), ,   Anesthesia Other Findings Multiple myeloma  Reproductive/Obstetrics                            Lab Results  Component Value Date   WBC 7.0 04/04/2017   HGB 9.4 (L) 04/04/2017   HCT 31.1 (L) 04/04/2017   MCV 87.1 04/04/2017   PLT 366 04/04/2017   Lab Results  Component Value Date   CREATININE 6.31 (H) 04/04/2017   BUN 29 (H) 04/04/2017   NA 137 04/04/2017   K 4.2 04/04/2017   CL 99 (L) 04/04/2017   CO2 24 04/04/2017    Anesthesia Physical Anesthesia Plan  ASA: III  Anesthesia Plan: General   Post-op Pain Management:    Induction: Intravenous  PONV Risk Score and Plan: 2 and Dexamethasone, Ondansetron and Treatment may vary due to age or medical condition  Airway Management Planned: LMA  Additional Equipment:   Intra-op Plan:   Post-operative Plan: Extubation in OR  Informed Consent: I have reviewed the patients History and Physical, chart, labs and discussed the procedure including the risks, benefits and alternatives for the proposed anesthesia with the patient or authorized representative who has indicated his/her understanding and acceptance.   Dental  advisory given  Plan Discussed with: CRNA  Anesthesia Plan Comments:        Anesthesia Quick Evaluation

## 2017-04-04 NOTE — Transfer of Care (Signed)
Immediate Anesthesia Transfer of Care Note  Patient: Jack [name]  Procedure(s) Performed: AMPUTATION DIGIT LEFT LONG FINGER (Left Finger)  Patient Location: PACU  Anesthesia Type:General  Level of Consciousness: awake, alert , oriented and patient cooperative  Airway & Oxygen Therapy: Patient Spontanous Breathing and Patient connected to nasal cannula oxygen  Post-op Assessment: Report given to RN and Post -op Vital signs reviewed and stable  Post vital signs: Reviewed and stable  Last Vitals:  Vitals Value Taken Time  BP 129/76 04/04/2017  4:57 PM  Temp 36.5 C 04/04/2017  4:57 PM  Pulse 72 04/04/2017  4:58 PM  Resp 10 04/04/2017  4:58 PM  SpO2 100 % 04/04/2017  4:58 PM  Vitals shown include unvalidated device data.  Last Pain:  Vitals:   04/04/17 1657  TempSrc:   PainSc: 0-No pain      Patients Stated Pain Goal: 5 (20/94/70 9628)  Complications: No apparent anesthesia complications

## 2017-04-04 NOTE — Brief Op Note (Signed)
04/04/2017  4:40 PM  PATIENT:  Bonney Leitz  72 y.o. male  PRE-OPERATIVE DIAGNOSIS:  LEFT LONG FINGER NECROSIS  POST-OPERATIVE DIAGNOSIS:  LEFT LONG FINGER NECROSIS  PROCEDURE:  Procedure(s): AMPUTATION DIGIT LEFT LONG FINGER (Left)  SURGEON:  Surgeon(s) and Role:    Leanora Cover, MD - Primary  PHYSICIAN ASSISTANT:   ASSISTANTS: none   ANESTHESIA:   general  EBL:  Minimal  BLOOD ADMINISTERED:none  DRAINS: none   LOCAL MEDICATIONS USED:  MARCAINE     SPECIMEN:  Source of Specimen:  left long finger  DISPOSITION OF SPECIMEN:  PATHOLOGY  COUNTS:  YES  TOURNIQUET:  ~ 45 minutes left forearm  DICTATION: .Other Dictation: Dictation Number [id number]  PLAN OF CARE: Discharge to home after PACU  PATIENT DISPOSITION:  PACU - hemodynamically stable.

## 2017-04-05 ENCOUNTER — Encounter (HOSPITAL_COMMUNITY): Payer: Self-pay | Admitting: Orthopedic Surgery

## 2017-04-05 NOTE — Op Note (Signed)
NAME:  Jack [name] NO.:  [id number]  MEDICAL RECORD NO.:  [id number]  LOCATION:                                 FACILITY:  PHYSICIAN:  Leanora Cover, MD             DATE OF BIRTH:  DATE OF PROCEDURE:  04/04/2017 DATE OF DISCHARGE:                              OPERATIVE REPORT   PREOPERATIVE DIAGNOSIS:  Left long finger necrosis.  POSTOPERATIVE DIAGNOSIS:  Left long finger necrosis.  PROCEDURE:  Left long finger amputation at the MP joint.  SURGEON:  Leanora Cover, MD.  ASSISTANT:  None.  ANESTHESIA:  General.  IV FLUIDS:  Per anesthesia flow sheet.  ESTIMATED BLOOD LOSS:  Minimal.  COMPLICATIONS:  None.  SPECIMENS:  Finger to Pathology for gross only.  TOURNIQUET TIME:  Approximately 45 minutes of left forearm at 250 mmHg.  DISPOSITION:  Stable to PACU.  INDICATIONS:  Jack [name] is a 72 year old male, who has had a nonhealing and necrosing wound of the left long finger.  He wishes to undergo operative amputation of the finger at the MP joint.  He has had this done on the opposite hand previously.  Risks, benefits, and alternatives of surgery were discussed including the risks of blood loss, infection, damage to nerves, vessels, tendons, ligaments, and bone, failure of surgery, need for additional surgery, complications with wound healing, continued pain.  He voiced understanding these risks and elected to proceed.  OPERATIVE COURSE:  After being identified preoperatively by myself, the patient and I agreed upon procedure and site of procedure.  Surgical site was marked.  The risks, benefits, and alternatives of surgery were reviewed and he wished to proceed.  Surgical consent had been signed. He was given IV Ancef as preoperative antibiotic prophylaxis.  He was transferred to the operating room and placed on the operating table in supine position with the left upper extremity on an arm board.  General anesthesia was induced by  anesthesiologist.  Left upper extremity was prepped and draped in a normal sterile orthopedic fashion.  A surgical pause was performed between surgeons, Anesthesia, and operating room staff; and all were in agreement as to the patient, procedure, and site of procedure.  Tourniquet at the proximal aspect of forearm was inflated to 250 mmHg after exsanguination of the limb with an Esmarch bandage. Incision was made circumferentially around the long finger leaving a paddle volarly for closure of the wound.  This was carried into subcutaneous tissues by spreading technique.  Bipolar electrocautery was used to obtain hemostasis.  The joint was identified at the dorsal aspect and the tendon released.  The incision was then entered volarly. The radial and ulnar neurovascular bundles were identified.  The nerve was placed under traction bipolar and allowed to retract.  The digital arteries were tied off using a Vicryl suture and then bipolar and allowed to retract.  The flexor tendons were divided.  The finger was amputated through the MP joint and sent to Pathology for gross examination only.  The wound was copiously irrigated with sterile saline.  It was then reapproximated using 5-0 Monocryl suture in a horizontal mattress fashion.  Good tension free re-apposition  of tissues was obtained.  There was good contour.  The wound was injected with 0.25% plain Marcaine to aid in postoperative analgesia.  It was then dressed with sterile Xeroform, 4 x 4's and wrapped with a Kerlix and Coban dressing lightly.  Tourniquet was deflated at approximately 45 minutes.  Fingertips were pink with brisk capillary refill after deflation of tourniquet.  The operative drapes were broken down and the patient was awoken from anesthesia safely.  He was transferred back to stretcher and taken to PACU in stable condition.  I will see him back in the office in 1 week for postoperative followup.  I will give him  Norco 5/325 one to two p.o. q.6 hours p.r.n. pain, dispensed #20.     Leanora Cover, MD     KK/MEDQ  D:  04/04/2017  T:  04/05/2017  Job:  [id number]

## 2017-04-07 DIAGNOSIS — Z992 Dependence on renal dialysis: Secondary | ICD-10-CM | POA: Diagnosis not present

## 2017-04-07 DIAGNOSIS — T82598A Other mechanical complication of other cardiac and vascular devices and implants, initial encounter: Secondary | ICD-10-CM | POA: Diagnosis not present

## 2017-04-07 DIAGNOSIS — R509 Fever, unspecified: Secondary | ICD-10-CM | POA: Diagnosis not present

## 2017-04-07 DIAGNOSIS — E1029 Type 1 diabetes mellitus with other diabetic kidney complication: Secondary | ICD-10-CM | POA: Diagnosis not present

## 2017-04-07 DIAGNOSIS — N186 End stage renal disease: Secondary | ICD-10-CM | POA: Diagnosis not present

## 2017-04-07 DIAGNOSIS — D689 Coagulation defect, unspecified: Secondary | ICD-10-CM | POA: Diagnosis not present

## 2017-04-07 DIAGNOSIS — D631 Anemia in chronic kidney disease: Secondary | ICD-10-CM | POA: Diagnosis not present

## 2017-04-07 DIAGNOSIS — M869 Osteomyelitis, unspecified: Secondary | ICD-10-CM | POA: Diagnosis not present

## 2017-04-07 DIAGNOSIS — N2581 Secondary hyperparathyroidism of renal origin: Secondary | ICD-10-CM | POA: Diagnosis not present

## 2017-04-09 DIAGNOSIS — T82598A Other mechanical complication of other cardiac and vascular devices and implants, initial encounter: Secondary | ICD-10-CM | POA: Diagnosis not present

## 2017-04-09 DIAGNOSIS — Z992 Dependence on renal dialysis: Secondary | ICD-10-CM | POA: Diagnosis not present

## 2017-04-09 DIAGNOSIS — R509 Fever, unspecified: Secondary | ICD-10-CM | POA: Diagnosis not present

## 2017-04-09 DIAGNOSIS — N186 End stage renal disease: Secondary | ICD-10-CM | POA: Diagnosis not present

## 2017-04-09 DIAGNOSIS — D689 Coagulation defect, unspecified: Secondary | ICD-10-CM | POA: Diagnosis not present

## 2017-04-09 DIAGNOSIS — M869 Osteomyelitis, unspecified: Secondary | ICD-10-CM | POA: Diagnosis not present

## 2017-04-09 DIAGNOSIS — N2581 Secondary hyperparathyroidism of renal origin: Secondary | ICD-10-CM | POA: Diagnosis not present

## 2017-04-09 DIAGNOSIS — E1029 Type 1 diabetes mellitus with other diabetic kidney complication: Secondary | ICD-10-CM | POA: Diagnosis not present

## 2017-04-09 DIAGNOSIS — D631 Anemia in chronic kidney disease: Secondary | ICD-10-CM | POA: Diagnosis not present

## 2017-04-10 ENCOUNTER — Telehealth: Payer: Self-pay | Admitting: Family Medicine

## 2017-04-10 NOTE — Telephone Encounter (Signed)
Spoke with Judson Roch, informed her that Dr. Martinique was out until Monday and as soon as she completes the form, I will fax it to [place]. She verbalized understanding.

## 2017-04-10 NOTE — Telephone Encounter (Signed)
Copied from [place] [phone]. Topic: Inquiry >> Apr 10, 2017 11:18 AM Conception Chancy, NT wrote: Judson Roch is calling from Dr. Fredna Dow following up on a fax she sent over. She states he does not sign off on home health so she was hoping that Dr. Martinique would. She states patient qualifies for [place] and patient agrees. She would like a call back.   Judson Roch [phone]

## 2017-04-11 DIAGNOSIS — T82598A Other mechanical complication of other cardiac and vascular devices and implants, initial encounter: Secondary | ICD-10-CM | POA: Diagnosis not present

## 2017-04-11 DIAGNOSIS — D631 Anemia in chronic kidney disease: Secondary | ICD-10-CM | POA: Diagnosis not present

## 2017-04-11 DIAGNOSIS — M869 Osteomyelitis, unspecified: Secondary | ICD-10-CM | POA: Diagnosis not present

## 2017-04-11 DIAGNOSIS — R509 Fever, unspecified: Secondary | ICD-10-CM | POA: Diagnosis not present

## 2017-04-11 DIAGNOSIS — N2581 Secondary hyperparathyroidism of renal origin: Secondary | ICD-10-CM | POA: Diagnosis not present

## 2017-04-11 DIAGNOSIS — N186 End stage renal disease: Secondary | ICD-10-CM | POA: Diagnosis not present

## 2017-04-11 DIAGNOSIS — Z992 Dependence on renal dialysis: Secondary | ICD-10-CM | POA: Diagnosis not present

## 2017-04-11 DIAGNOSIS — D689 Coagulation defect, unspecified: Secondary | ICD-10-CM | POA: Diagnosis not present

## 2017-04-11 DIAGNOSIS — E1029 Type 1 diabetes mellitus with other diabetic kidney complication: Secondary | ICD-10-CM | POA: Diagnosis not present

## 2017-04-14 DIAGNOSIS — N186 End stage renal disease: Secondary | ICD-10-CM | POA: Diagnosis not present

## 2017-04-14 DIAGNOSIS — Z283 Underimmunization status: Secondary | ICD-10-CM | POA: Diagnosis not present

## 2017-04-14 DIAGNOSIS — T82598A Other mechanical complication of other cardiac and vascular devices and implants, initial encounter: Secondary | ICD-10-CM | POA: Diagnosis not present

## 2017-04-14 DIAGNOSIS — C9000 Multiple myeloma not having achieved remission: Secondary | ICD-10-CM | POA: Diagnosis not present

## 2017-04-14 DIAGNOSIS — N2581 Secondary hyperparathyroidism of renal origin: Secondary | ICD-10-CM | POA: Diagnosis not present

## 2017-04-14 DIAGNOSIS — E1029 Type 1 diabetes mellitus with other diabetic kidney complication: Secondary | ICD-10-CM | POA: Diagnosis not present

## 2017-04-14 DIAGNOSIS — Z992 Dependence on renal dialysis: Secondary | ICD-10-CM | POA: Diagnosis not present

## 2017-04-14 DIAGNOSIS — D631 Anemia in chronic kidney disease: Secondary | ICD-10-CM | POA: Diagnosis not present

## 2017-04-14 DIAGNOSIS — D509 Iron deficiency anemia, unspecified: Secondary | ICD-10-CM | POA: Diagnosis not present

## 2017-04-14 DIAGNOSIS — D689 Coagulation defect, unspecified: Secondary | ICD-10-CM | POA: Diagnosis not present

## 2017-04-16 DIAGNOSIS — E1029 Type 1 diabetes mellitus with other diabetic kidney complication: Secondary | ICD-10-CM | POA: Diagnosis not present

## 2017-04-16 DIAGNOSIS — Z992 Dependence on renal dialysis: Secondary | ICD-10-CM | POA: Diagnosis not present

## 2017-04-16 DIAGNOSIS — Z283 Underimmunization status: Secondary | ICD-10-CM | POA: Diagnosis not present

## 2017-04-16 DIAGNOSIS — D509 Iron deficiency anemia, unspecified: Secondary | ICD-10-CM | POA: Diagnosis not present

## 2017-04-16 DIAGNOSIS — T82598A Other mechanical complication of other cardiac and vascular devices and implants, initial encounter: Secondary | ICD-10-CM | POA: Diagnosis not present

## 2017-04-16 DIAGNOSIS — N2581 Secondary hyperparathyroidism of renal origin: Secondary | ICD-10-CM | POA: Diagnosis not present

## 2017-04-16 DIAGNOSIS — D689 Coagulation defect, unspecified: Secondary | ICD-10-CM | POA: Diagnosis not present

## 2017-04-16 DIAGNOSIS — N186 End stage renal disease: Secondary | ICD-10-CM | POA: Diagnosis not present

## 2017-04-16 DIAGNOSIS — D631 Anemia in chronic kidney disease: Secondary | ICD-10-CM | POA: Diagnosis not present

## 2017-04-16 NOTE — Telephone Encounter (Signed)
Forms completed and faxed to [facility] on 04/16/17. Fax confirmation received.

## 2017-04-18 DIAGNOSIS — Z992 Dependence on renal dialysis: Secondary | ICD-10-CM | POA: Diagnosis not present

## 2017-04-18 DIAGNOSIS — D631 Anemia in chronic kidney disease: Secondary | ICD-10-CM | POA: Diagnosis not present

## 2017-04-18 DIAGNOSIS — D509 Iron deficiency anemia, unspecified: Secondary | ICD-10-CM | POA: Diagnosis not present

## 2017-04-18 DIAGNOSIS — N186 End stage renal disease: Secondary | ICD-10-CM | POA: Diagnosis not present

## 2017-04-18 DIAGNOSIS — Z283 Underimmunization status: Secondary | ICD-10-CM | POA: Diagnosis not present

## 2017-04-18 DIAGNOSIS — T82598A Other mechanical complication of other cardiac and vascular devices and implants, initial encounter: Secondary | ICD-10-CM | POA: Diagnosis not present

## 2017-04-18 DIAGNOSIS — N2581 Secondary hyperparathyroidism of renal origin: Secondary | ICD-10-CM | POA: Diagnosis not present

## 2017-04-18 DIAGNOSIS — D689 Coagulation defect, unspecified: Secondary | ICD-10-CM | POA: Diagnosis not present

## 2017-04-18 DIAGNOSIS — E1029 Type 1 diabetes mellitus with other diabetic kidney complication: Secondary | ICD-10-CM | POA: Diagnosis not present

## 2017-04-21 ENCOUNTER — Telehealth: Payer: Self-pay | Admitting: Family Medicine

## 2017-04-21 DIAGNOSIS — N186 End stage renal disease: Secondary | ICD-10-CM | POA: Diagnosis not present

## 2017-04-21 DIAGNOSIS — D509 Iron deficiency anemia, unspecified: Secondary | ICD-10-CM | POA: Diagnosis not present

## 2017-04-21 DIAGNOSIS — N2581 Secondary hyperparathyroidism of renal origin: Secondary | ICD-10-CM | POA: Diagnosis not present

## 2017-04-21 DIAGNOSIS — D689 Coagulation defect, unspecified: Secondary | ICD-10-CM | POA: Diagnosis not present

## 2017-04-21 DIAGNOSIS — T82598A Other mechanical complication of other cardiac and vascular devices and implants, initial encounter: Secondary | ICD-10-CM | POA: Diagnosis not present

## 2017-04-21 DIAGNOSIS — D631 Anemia in chronic kidney disease: Secondary | ICD-10-CM | POA: Diagnosis not present

## 2017-04-21 DIAGNOSIS — Z283 Underimmunization status: Secondary | ICD-10-CM | POA: Diagnosis not present

## 2017-04-21 DIAGNOSIS — Z992 Dependence on renal dialysis: Secondary | ICD-10-CM | POA: Diagnosis not present

## 2017-04-21 DIAGNOSIS — E1029 Type 1 diabetes mellitus with other diabetic kidney complication: Secondary | ICD-10-CM | POA: Diagnosis not present

## 2017-04-21 NOTE — Telephone Encounter (Signed)
Copied from [place] [phone]. Topic: Quick Communication - Rx Refill/Question >> Apr 21, 2017  3:36 PM Margot Ables wrote: Medication: freestyle libre - pt is needing another order sent in - he placed last one 2 days ago and it is good for 14 days - pt is new to this device but states it is doing fantastic Has the patient contacted their pharmacy? No  Preferred Pharmacy (with phone number or street name): [id number] [place], [place] [phone] (Phone) [phone] (Fax)

## 2017-04-22 NOTE — Telephone Encounter (Signed)
Called patient to let him know that he can call his pharmacy when he is ready for the refill on the [employer]. Advised him to call at least 7 days in advance. Pt voiced understanding.

## 2017-04-22 NOTE — Telephone Encounter (Signed)
FYI sent to Dr. Jordan 

## 2017-04-23 DIAGNOSIS — N2581 Secondary hyperparathyroidism of renal origin: Secondary | ICD-10-CM | POA: Diagnosis not present

## 2017-04-23 DIAGNOSIS — Z283 Underimmunization status: Secondary | ICD-10-CM | POA: Diagnosis not present

## 2017-04-23 DIAGNOSIS — E1029 Type 1 diabetes mellitus with other diabetic kidney complication: Secondary | ICD-10-CM | POA: Diagnosis not present

## 2017-04-23 DIAGNOSIS — D631 Anemia in chronic kidney disease: Secondary | ICD-10-CM | POA: Diagnosis not present

## 2017-04-23 DIAGNOSIS — D509 Iron deficiency anemia, unspecified: Secondary | ICD-10-CM | POA: Diagnosis not present

## 2017-04-23 DIAGNOSIS — T82598A Other mechanical complication of other cardiac and vascular devices and implants, initial encounter: Secondary | ICD-10-CM | POA: Diagnosis not present

## 2017-04-23 DIAGNOSIS — D689 Coagulation defect, unspecified: Secondary | ICD-10-CM | POA: Diagnosis not present

## 2017-04-23 DIAGNOSIS — N186 End stage renal disease: Secondary | ICD-10-CM | POA: Diagnosis not present

## 2017-04-23 DIAGNOSIS — Z992 Dependence on renal dialysis: Secondary | ICD-10-CM | POA: Diagnosis not present

## 2017-04-25 DIAGNOSIS — Z992 Dependence on renal dialysis: Secondary | ICD-10-CM | POA: Diagnosis not present

## 2017-04-25 DIAGNOSIS — N2581 Secondary hyperparathyroidism of renal origin: Secondary | ICD-10-CM | POA: Diagnosis not present

## 2017-04-25 DIAGNOSIS — E1029 Type 1 diabetes mellitus with other diabetic kidney complication: Secondary | ICD-10-CM | POA: Diagnosis not present

## 2017-04-25 DIAGNOSIS — D631 Anemia in chronic kidney disease: Secondary | ICD-10-CM | POA: Diagnosis not present

## 2017-04-25 DIAGNOSIS — Z283 Underimmunization status: Secondary | ICD-10-CM | POA: Diagnosis not present

## 2017-04-25 DIAGNOSIS — D689 Coagulation defect, unspecified: Secondary | ICD-10-CM | POA: Diagnosis not present

## 2017-04-25 DIAGNOSIS — D509 Iron deficiency anemia, unspecified: Secondary | ICD-10-CM | POA: Diagnosis not present

## 2017-04-25 DIAGNOSIS — T82598A Other mechanical complication of other cardiac and vascular devices and implants, initial encounter: Secondary | ICD-10-CM | POA: Diagnosis not present

## 2017-04-25 DIAGNOSIS — N186 End stage renal disease: Secondary | ICD-10-CM | POA: Diagnosis not present

## 2017-04-28 ENCOUNTER — Other Ambulatory Visit: Payer: Self-pay | Admitting: Family Medicine

## 2017-04-28 DIAGNOSIS — E1151 Type 2 diabetes mellitus with diabetic peripheral angiopathy without gangrene: Secondary | ICD-10-CM

## 2017-04-28 DIAGNOSIS — D631 Anemia in chronic kidney disease: Secondary | ICD-10-CM | POA: Diagnosis not present

## 2017-04-28 DIAGNOSIS — Z992 Dependence on renal dialysis: Secondary | ICD-10-CM | POA: Diagnosis not present

## 2017-04-28 DIAGNOSIS — T82598A Other mechanical complication of other cardiac and vascular devices and implants, initial encounter: Secondary | ICD-10-CM | POA: Diagnosis not present

## 2017-04-28 DIAGNOSIS — D689 Coagulation defect, unspecified: Secondary | ICD-10-CM | POA: Diagnosis not present

## 2017-04-28 DIAGNOSIS — N2581 Secondary hyperparathyroidism of renal origin: Secondary | ICD-10-CM | POA: Diagnosis not present

## 2017-04-28 DIAGNOSIS — Z283 Underimmunization status: Secondary | ICD-10-CM | POA: Diagnosis not present

## 2017-04-28 DIAGNOSIS — E1029 Type 1 diabetes mellitus with other diabetic kidney complication: Secondary | ICD-10-CM | POA: Diagnosis not present

## 2017-04-28 DIAGNOSIS — D509 Iron deficiency anemia, unspecified: Secondary | ICD-10-CM | POA: Diagnosis not present

## 2017-04-28 DIAGNOSIS — N186 End stage renal disease: Secondary | ICD-10-CM | POA: Diagnosis not present

## 2017-04-30 DIAGNOSIS — N186 End stage renal disease: Secondary | ICD-10-CM | POA: Diagnosis not present

## 2017-04-30 DIAGNOSIS — D509 Iron deficiency anemia, unspecified: Secondary | ICD-10-CM | POA: Diagnosis not present

## 2017-04-30 DIAGNOSIS — D689 Coagulation defect, unspecified: Secondary | ICD-10-CM | POA: Diagnosis not present

## 2017-04-30 DIAGNOSIS — Z283 Underimmunization status: Secondary | ICD-10-CM | POA: Diagnosis not present

## 2017-04-30 DIAGNOSIS — T82598A Other mechanical complication of other cardiac and vascular devices and implants, initial encounter: Secondary | ICD-10-CM | POA: Diagnosis not present

## 2017-04-30 DIAGNOSIS — E1029 Type 1 diabetes mellitus with other diabetic kidney complication: Secondary | ICD-10-CM | POA: Diagnosis not present

## 2017-04-30 DIAGNOSIS — Z992 Dependence on renal dialysis: Secondary | ICD-10-CM | POA: Diagnosis not present

## 2017-04-30 DIAGNOSIS — N2581 Secondary hyperparathyroidism of renal origin: Secondary | ICD-10-CM | POA: Diagnosis not present

## 2017-04-30 DIAGNOSIS — D631 Anemia in chronic kidney disease: Secondary | ICD-10-CM | POA: Diagnosis not present

## 2017-05-02 DIAGNOSIS — E1029 Type 1 diabetes mellitus with other diabetic kidney complication: Secondary | ICD-10-CM | POA: Diagnosis not present

## 2017-05-02 DIAGNOSIS — D689 Coagulation defect, unspecified: Secondary | ICD-10-CM | POA: Diagnosis not present

## 2017-05-02 DIAGNOSIS — N186 End stage renal disease: Secondary | ICD-10-CM | POA: Diagnosis not present

## 2017-05-02 DIAGNOSIS — T82598A Other mechanical complication of other cardiac and vascular devices and implants, initial encounter: Secondary | ICD-10-CM | POA: Diagnosis not present

## 2017-05-02 DIAGNOSIS — Z992 Dependence on renal dialysis: Secondary | ICD-10-CM | POA: Diagnosis not present

## 2017-05-02 DIAGNOSIS — D509 Iron deficiency anemia, unspecified: Secondary | ICD-10-CM | POA: Diagnosis not present

## 2017-05-02 DIAGNOSIS — Z283 Underimmunization status: Secondary | ICD-10-CM | POA: Diagnosis not present

## 2017-05-02 DIAGNOSIS — N2581 Secondary hyperparathyroidism of renal origin: Secondary | ICD-10-CM | POA: Diagnosis not present

## 2017-05-02 DIAGNOSIS — D631 Anemia in chronic kidney disease: Secondary | ICD-10-CM | POA: Diagnosis not present

## 2017-05-05 DIAGNOSIS — N2581 Secondary hyperparathyroidism of renal origin: Secondary | ICD-10-CM | POA: Diagnosis not present

## 2017-05-05 DIAGNOSIS — Z992 Dependence on renal dialysis: Secondary | ICD-10-CM | POA: Diagnosis not present

## 2017-05-05 DIAGNOSIS — Z283 Underimmunization status: Secondary | ICD-10-CM | POA: Diagnosis not present

## 2017-05-05 DIAGNOSIS — D509 Iron deficiency anemia, unspecified: Secondary | ICD-10-CM | POA: Diagnosis not present

## 2017-05-05 DIAGNOSIS — D631 Anemia in chronic kidney disease: Secondary | ICD-10-CM | POA: Diagnosis not present

## 2017-05-05 DIAGNOSIS — D689 Coagulation defect, unspecified: Secondary | ICD-10-CM | POA: Diagnosis not present

## 2017-05-05 DIAGNOSIS — E1029 Type 1 diabetes mellitus with other diabetic kidney complication: Secondary | ICD-10-CM | POA: Diagnosis not present

## 2017-05-05 DIAGNOSIS — T82598A Other mechanical complication of other cardiac and vascular devices and implants, initial encounter: Secondary | ICD-10-CM | POA: Diagnosis not present

## 2017-05-05 DIAGNOSIS — N186 End stage renal disease: Secondary | ICD-10-CM | POA: Diagnosis not present

## 2017-05-07 DIAGNOSIS — N2581 Secondary hyperparathyroidism of renal origin: Secondary | ICD-10-CM | POA: Diagnosis not present

## 2017-05-07 DIAGNOSIS — D631 Anemia in chronic kidney disease: Secondary | ICD-10-CM | POA: Diagnosis not present

## 2017-05-07 DIAGNOSIS — Z283 Underimmunization status: Secondary | ICD-10-CM | POA: Diagnosis not present

## 2017-05-07 DIAGNOSIS — Z992 Dependence on renal dialysis: Secondary | ICD-10-CM | POA: Diagnosis not present

## 2017-05-07 DIAGNOSIS — T82598A Other mechanical complication of other cardiac and vascular devices and implants, initial encounter: Secondary | ICD-10-CM | POA: Diagnosis not present

## 2017-05-07 DIAGNOSIS — D509 Iron deficiency anemia, unspecified: Secondary | ICD-10-CM | POA: Diagnosis not present

## 2017-05-07 DIAGNOSIS — N186 End stage renal disease: Secondary | ICD-10-CM | POA: Diagnosis not present

## 2017-05-07 DIAGNOSIS — E1029 Type 1 diabetes mellitus with other diabetic kidney complication: Secondary | ICD-10-CM | POA: Diagnosis not present

## 2017-05-07 DIAGNOSIS — D689 Coagulation defect, unspecified: Secondary | ICD-10-CM | POA: Diagnosis not present

## 2017-05-09 DIAGNOSIS — Z283 Underimmunization status: Secondary | ICD-10-CM | POA: Diagnosis not present

## 2017-05-09 DIAGNOSIS — N2581 Secondary hyperparathyroidism of renal origin: Secondary | ICD-10-CM | POA: Diagnosis not present

## 2017-05-09 DIAGNOSIS — E1029 Type 1 diabetes mellitus with other diabetic kidney complication: Secondary | ICD-10-CM | POA: Diagnosis not present

## 2017-05-09 DIAGNOSIS — Z992 Dependence on renal dialysis: Secondary | ICD-10-CM | POA: Diagnosis not present

## 2017-05-09 DIAGNOSIS — D509 Iron deficiency anemia, unspecified: Secondary | ICD-10-CM | POA: Diagnosis not present

## 2017-05-09 DIAGNOSIS — T82598A Other mechanical complication of other cardiac and vascular devices and implants, initial encounter: Secondary | ICD-10-CM | POA: Diagnosis not present

## 2017-05-09 DIAGNOSIS — D689 Coagulation defect, unspecified: Secondary | ICD-10-CM | POA: Diagnosis not present

## 2017-05-09 DIAGNOSIS — N186 End stage renal disease: Secondary | ICD-10-CM | POA: Diagnosis not present

## 2017-05-09 DIAGNOSIS — D631 Anemia in chronic kidney disease: Secondary | ICD-10-CM | POA: Diagnosis not present

## 2017-05-12 DIAGNOSIS — Z992 Dependence on renal dialysis: Secondary | ICD-10-CM | POA: Diagnosis not present

## 2017-05-12 DIAGNOSIS — D631 Anemia in chronic kidney disease: Secondary | ICD-10-CM | POA: Diagnosis not present

## 2017-05-12 DIAGNOSIS — E1029 Type 1 diabetes mellitus with other diabetic kidney complication: Secondary | ICD-10-CM | POA: Diagnosis not present

## 2017-05-12 DIAGNOSIS — D689 Coagulation defect, unspecified: Secondary | ICD-10-CM | POA: Diagnosis not present

## 2017-05-12 DIAGNOSIS — Z283 Underimmunization status: Secondary | ICD-10-CM | POA: Diagnosis not present

## 2017-05-12 DIAGNOSIS — N2581 Secondary hyperparathyroidism of renal origin: Secondary | ICD-10-CM | POA: Diagnosis not present

## 2017-05-12 DIAGNOSIS — T82598A Other mechanical complication of other cardiac and vascular devices and implants, initial encounter: Secondary | ICD-10-CM | POA: Diagnosis not present

## 2017-05-12 DIAGNOSIS — N186 End stage renal disease: Secondary | ICD-10-CM | POA: Diagnosis not present

## 2017-05-12 DIAGNOSIS — D509 Iron deficiency anemia, unspecified: Secondary | ICD-10-CM | POA: Diagnosis not present

## 2017-05-14 DIAGNOSIS — E1029 Type 1 diabetes mellitus with other diabetic kidney complication: Secondary | ICD-10-CM | POA: Diagnosis not present

## 2017-05-14 DIAGNOSIS — D631 Anemia in chronic kidney disease: Secondary | ICD-10-CM | POA: Diagnosis not present

## 2017-05-14 DIAGNOSIS — Z992 Dependence on renal dialysis: Secondary | ICD-10-CM | POA: Diagnosis not present

## 2017-05-14 DIAGNOSIS — T82598A Other mechanical complication of other cardiac and vascular devices and implants, initial encounter: Secondary | ICD-10-CM | POA: Diagnosis not present

## 2017-05-14 DIAGNOSIS — N2581 Secondary hyperparathyroidism of renal origin: Secondary | ICD-10-CM | POA: Diagnosis not present

## 2017-05-14 DIAGNOSIS — N186 End stage renal disease: Secondary | ICD-10-CM | POA: Diagnosis not present

## 2017-05-14 DIAGNOSIS — M869 Osteomyelitis, unspecified: Secondary | ICD-10-CM | POA: Diagnosis not present

## 2017-05-14 DIAGNOSIS — D689 Coagulation defect, unspecified: Secondary | ICD-10-CM | POA: Diagnosis not present

## 2017-05-14 DIAGNOSIS — R509 Fever, unspecified: Secondary | ICD-10-CM | POA: Diagnosis not present

## 2017-05-14 DIAGNOSIS — D509 Iron deficiency anemia, unspecified: Secondary | ICD-10-CM | POA: Diagnosis not present

## 2017-05-16 DIAGNOSIS — E1029 Type 1 diabetes mellitus with other diabetic kidney complication: Secondary | ICD-10-CM | POA: Diagnosis not present

## 2017-05-16 DIAGNOSIS — T82598A Other mechanical complication of other cardiac and vascular devices and implants, initial encounter: Secondary | ICD-10-CM | POA: Diagnosis not present

## 2017-05-16 DIAGNOSIS — R509 Fever, unspecified: Secondary | ICD-10-CM | POA: Diagnosis not present

## 2017-05-16 DIAGNOSIS — Z992 Dependence on renal dialysis: Secondary | ICD-10-CM | POA: Diagnosis not present

## 2017-05-16 DIAGNOSIS — N2581 Secondary hyperparathyroidism of renal origin: Secondary | ICD-10-CM | POA: Diagnosis not present

## 2017-05-16 DIAGNOSIS — D631 Anemia in chronic kidney disease: Secondary | ICD-10-CM | POA: Diagnosis not present

## 2017-05-16 DIAGNOSIS — N186 End stage renal disease: Secondary | ICD-10-CM | POA: Diagnosis not present

## 2017-05-16 DIAGNOSIS — D689 Coagulation defect, unspecified: Secondary | ICD-10-CM | POA: Diagnosis not present

## 2017-05-16 DIAGNOSIS — D509 Iron deficiency anemia, unspecified: Secondary | ICD-10-CM | POA: Diagnosis not present

## 2017-05-16 DIAGNOSIS — M869 Osteomyelitis, unspecified: Secondary | ICD-10-CM | POA: Diagnosis not present

## 2017-05-19 DIAGNOSIS — D631 Anemia in chronic kidney disease: Secondary | ICD-10-CM | POA: Diagnosis not present

## 2017-05-19 DIAGNOSIS — N186 End stage renal disease: Secondary | ICD-10-CM | POA: Diagnosis not present

## 2017-05-19 DIAGNOSIS — D689 Coagulation defect, unspecified: Secondary | ICD-10-CM | POA: Diagnosis not present

## 2017-05-19 DIAGNOSIS — Z992 Dependence on renal dialysis: Secondary | ICD-10-CM | POA: Diagnosis not present

## 2017-05-19 DIAGNOSIS — D509 Iron deficiency anemia, unspecified: Secondary | ICD-10-CM | POA: Diagnosis not present

## 2017-05-19 DIAGNOSIS — M869 Osteomyelitis, unspecified: Secondary | ICD-10-CM | POA: Diagnosis not present

## 2017-05-19 DIAGNOSIS — T82598A Other mechanical complication of other cardiac and vascular devices and implants, initial encounter: Secondary | ICD-10-CM | POA: Diagnosis not present

## 2017-05-19 DIAGNOSIS — N2581 Secondary hyperparathyroidism of renal origin: Secondary | ICD-10-CM | POA: Diagnosis not present

## 2017-05-19 DIAGNOSIS — E1029 Type 1 diabetes mellitus with other diabetic kidney complication: Secondary | ICD-10-CM | POA: Diagnosis not present

## 2017-05-19 DIAGNOSIS — R509 Fever, unspecified: Secondary | ICD-10-CM | POA: Diagnosis not present

## 2017-05-21 ENCOUNTER — Other Ambulatory Visit: Payer: Self-pay | Admitting: Family Medicine

## 2017-05-21 DIAGNOSIS — E1029 Type 1 diabetes mellitus with other diabetic kidney complication: Secondary | ICD-10-CM | POA: Diagnosis not present

## 2017-05-21 DIAGNOSIS — D689 Coagulation defect, unspecified: Secondary | ICD-10-CM | POA: Diagnosis not present

## 2017-05-21 DIAGNOSIS — R509 Fever, unspecified: Secondary | ICD-10-CM | POA: Diagnosis not present

## 2017-05-21 DIAGNOSIS — E1151 Type 2 diabetes mellitus with diabetic peripheral angiopathy without gangrene: Secondary | ICD-10-CM

## 2017-05-21 DIAGNOSIS — D509 Iron deficiency anemia, unspecified: Secondary | ICD-10-CM | POA: Diagnosis not present

## 2017-05-21 DIAGNOSIS — T82598A Other mechanical complication of other cardiac and vascular devices and implants, initial encounter: Secondary | ICD-10-CM | POA: Diagnosis not present

## 2017-05-21 DIAGNOSIS — Z992 Dependence on renal dialysis: Secondary | ICD-10-CM | POA: Diagnosis not present

## 2017-05-21 DIAGNOSIS — N2581 Secondary hyperparathyroidism of renal origin: Secondary | ICD-10-CM | POA: Diagnosis not present

## 2017-05-21 DIAGNOSIS — N186 End stage renal disease: Secondary | ICD-10-CM | POA: Diagnosis not present

## 2017-05-21 DIAGNOSIS — M869 Osteomyelitis, unspecified: Secondary | ICD-10-CM | POA: Diagnosis not present

## 2017-05-21 DIAGNOSIS — D631 Anemia in chronic kidney disease: Secondary | ICD-10-CM | POA: Diagnosis not present

## 2017-05-23 DIAGNOSIS — D689 Coagulation defect, unspecified: Secondary | ICD-10-CM | POA: Diagnosis not present

## 2017-05-23 DIAGNOSIS — N2581 Secondary hyperparathyroidism of renal origin: Secondary | ICD-10-CM | POA: Diagnosis not present

## 2017-05-23 DIAGNOSIS — E1029 Type 1 diabetes mellitus with other diabetic kidney complication: Secondary | ICD-10-CM | POA: Diagnosis not present

## 2017-05-23 DIAGNOSIS — M869 Osteomyelitis, unspecified: Secondary | ICD-10-CM | POA: Diagnosis not present

## 2017-05-23 DIAGNOSIS — R509 Fever, unspecified: Secondary | ICD-10-CM | POA: Diagnosis not present

## 2017-05-23 DIAGNOSIS — D631 Anemia in chronic kidney disease: Secondary | ICD-10-CM | POA: Diagnosis not present

## 2017-05-23 DIAGNOSIS — N186 End stage renal disease: Secondary | ICD-10-CM | POA: Diagnosis not present

## 2017-05-23 DIAGNOSIS — T82598A Other mechanical complication of other cardiac and vascular devices and implants, initial encounter: Secondary | ICD-10-CM | POA: Diagnosis not present

## 2017-05-23 DIAGNOSIS — D509 Iron deficiency anemia, unspecified: Secondary | ICD-10-CM | POA: Diagnosis not present

## 2017-05-23 DIAGNOSIS — Z992 Dependence on renal dialysis: Secondary | ICD-10-CM | POA: Diagnosis not present

## 2017-05-26 DIAGNOSIS — T82598A Other mechanical complication of other cardiac and vascular devices and implants, initial encounter: Secondary | ICD-10-CM | POA: Diagnosis not present

## 2017-05-26 DIAGNOSIS — R509 Fever, unspecified: Secondary | ICD-10-CM | POA: Diagnosis not present

## 2017-05-26 DIAGNOSIS — D689 Coagulation defect, unspecified: Secondary | ICD-10-CM | POA: Diagnosis not present

## 2017-05-26 DIAGNOSIS — Z992 Dependence on renal dialysis: Secondary | ICD-10-CM | POA: Diagnosis not present

## 2017-05-26 DIAGNOSIS — D509 Iron deficiency anemia, unspecified: Secondary | ICD-10-CM | POA: Diagnosis not present

## 2017-05-26 DIAGNOSIS — D631 Anemia in chronic kidney disease: Secondary | ICD-10-CM | POA: Diagnosis not present

## 2017-05-26 DIAGNOSIS — N186 End stage renal disease: Secondary | ICD-10-CM | POA: Diagnosis not present

## 2017-05-26 DIAGNOSIS — E1029 Type 1 diabetes mellitus with other diabetic kidney complication: Secondary | ICD-10-CM | POA: Diagnosis not present

## 2017-05-26 DIAGNOSIS — M869 Osteomyelitis, unspecified: Secondary | ICD-10-CM | POA: Diagnosis not present

## 2017-05-26 DIAGNOSIS — N2581 Secondary hyperparathyroidism of renal origin: Secondary | ICD-10-CM | POA: Diagnosis not present

## 2017-05-28 DIAGNOSIS — Z992 Dependence on renal dialysis: Secondary | ICD-10-CM | POA: Diagnosis not present

## 2017-05-28 DIAGNOSIS — M869 Osteomyelitis, unspecified: Secondary | ICD-10-CM | POA: Diagnosis not present

## 2017-05-28 DIAGNOSIS — D689 Coagulation defect, unspecified: Secondary | ICD-10-CM | POA: Diagnosis not present

## 2017-05-28 DIAGNOSIS — T82598A Other mechanical complication of other cardiac and vascular devices and implants, initial encounter: Secondary | ICD-10-CM | POA: Diagnosis not present

## 2017-05-28 DIAGNOSIS — E1029 Type 1 diabetes mellitus with other diabetic kidney complication: Secondary | ICD-10-CM | POA: Diagnosis not present

## 2017-05-28 DIAGNOSIS — N2581 Secondary hyperparathyroidism of renal origin: Secondary | ICD-10-CM | POA: Diagnosis not present

## 2017-05-28 DIAGNOSIS — D509 Iron deficiency anemia, unspecified: Secondary | ICD-10-CM | POA: Diagnosis not present

## 2017-05-28 DIAGNOSIS — N186 End stage renal disease: Secondary | ICD-10-CM | POA: Diagnosis not present

## 2017-05-28 DIAGNOSIS — R509 Fever, unspecified: Secondary | ICD-10-CM | POA: Diagnosis not present

## 2017-05-28 DIAGNOSIS — D631 Anemia in chronic kidney disease: Secondary | ICD-10-CM | POA: Diagnosis not present

## 2017-05-30 DIAGNOSIS — E1029 Type 1 diabetes mellitus with other diabetic kidney complication: Secondary | ICD-10-CM | POA: Diagnosis not present

## 2017-05-30 DIAGNOSIS — T82598A Other mechanical complication of other cardiac and vascular devices and implants, initial encounter: Secondary | ICD-10-CM | POA: Diagnosis not present

## 2017-05-30 DIAGNOSIS — Z992 Dependence on renal dialysis: Secondary | ICD-10-CM | POA: Diagnosis not present

## 2017-05-30 DIAGNOSIS — D631 Anemia in chronic kidney disease: Secondary | ICD-10-CM | POA: Diagnosis not present

## 2017-05-30 DIAGNOSIS — N186 End stage renal disease: Secondary | ICD-10-CM | POA: Diagnosis not present

## 2017-05-30 DIAGNOSIS — D509 Iron deficiency anemia, unspecified: Secondary | ICD-10-CM | POA: Diagnosis not present

## 2017-05-30 DIAGNOSIS — D689 Coagulation defect, unspecified: Secondary | ICD-10-CM | POA: Diagnosis not present

## 2017-05-30 DIAGNOSIS — R509 Fever, unspecified: Secondary | ICD-10-CM | POA: Diagnosis not present

## 2017-05-30 DIAGNOSIS — N2581 Secondary hyperparathyroidism of renal origin: Secondary | ICD-10-CM | POA: Diagnosis not present

## 2017-05-30 DIAGNOSIS — M869 Osteomyelitis, unspecified: Secondary | ICD-10-CM | POA: Diagnosis not present

## 2017-06-02 DIAGNOSIS — D689 Coagulation defect, unspecified: Secondary | ICD-10-CM | POA: Diagnosis not present

## 2017-06-02 DIAGNOSIS — D509 Iron deficiency anemia, unspecified: Secondary | ICD-10-CM | POA: Diagnosis not present

## 2017-06-02 DIAGNOSIS — N186 End stage renal disease: Secondary | ICD-10-CM | POA: Diagnosis not present

## 2017-06-02 DIAGNOSIS — M869 Osteomyelitis, unspecified: Secondary | ICD-10-CM | POA: Diagnosis not present

## 2017-06-02 DIAGNOSIS — Z992 Dependence on renal dialysis: Secondary | ICD-10-CM | POA: Diagnosis not present

## 2017-06-02 DIAGNOSIS — N2581 Secondary hyperparathyroidism of renal origin: Secondary | ICD-10-CM | POA: Diagnosis not present

## 2017-06-02 DIAGNOSIS — R509 Fever, unspecified: Secondary | ICD-10-CM | POA: Diagnosis not present

## 2017-06-02 DIAGNOSIS — D631 Anemia in chronic kidney disease: Secondary | ICD-10-CM | POA: Diagnosis not present

## 2017-06-02 DIAGNOSIS — E1029 Type 1 diabetes mellitus with other diabetic kidney complication: Secondary | ICD-10-CM | POA: Diagnosis not present

## 2017-06-02 DIAGNOSIS — T82598A Other mechanical complication of other cardiac and vascular devices and implants, initial encounter: Secondary | ICD-10-CM | POA: Diagnosis not present

## 2017-06-04 DIAGNOSIS — N2581 Secondary hyperparathyroidism of renal origin: Secondary | ICD-10-CM | POA: Diagnosis not present

## 2017-06-04 DIAGNOSIS — M869 Osteomyelitis, unspecified: Secondary | ICD-10-CM | POA: Diagnosis not present

## 2017-06-04 DIAGNOSIS — N186 End stage renal disease: Secondary | ICD-10-CM | POA: Diagnosis not present

## 2017-06-04 DIAGNOSIS — Z992 Dependence on renal dialysis: Secondary | ICD-10-CM | POA: Diagnosis not present

## 2017-06-04 DIAGNOSIS — D631 Anemia in chronic kidney disease: Secondary | ICD-10-CM | POA: Diagnosis not present

## 2017-06-04 DIAGNOSIS — E1029 Type 1 diabetes mellitus with other diabetic kidney complication: Secondary | ICD-10-CM | POA: Diagnosis not present

## 2017-06-04 DIAGNOSIS — D509 Iron deficiency anemia, unspecified: Secondary | ICD-10-CM | POA: Diagnosis not present

## 2017-06-04 DIAGNOSIS — D689 Coagulation defect, unspecified: Secondary | ICD-10-CM | POA: Diagnosis not present

## 2017-06-04 DIAGNOSIS — R509 Fever, unspecified: Secondary | ICD-10-CM | POA: Diagnosis not present

## 2017-06-04 DIAGNOSIS — T82598A Other mechanical complication of other cardiac and vascular devices and implants, initial encounter: Secondary | ICD-10-CM | POA: Diagnosis not present

## 2017-06-06 DIAGNOSIS — M869 Osteomyelitis, unspecified: Secondary | ICD-10-CM | POA: Diagnosis not present

## 2017-06-06 DIAGNOSIS — E1029 Type 1 diabetes mellitus with other diabetic kidney complication: Secondary | ICD-10-CM | POA: Diagnosis not present

## 2017-06-06 DIAGNOSIS — D509 Iron deficiency anemia, unspecified: Secondary | ICD-10-CM | POA: Diagnosis not present

## 2017-06-06 DIAGNOSIS — R509 Fever, unspecified: Secondary | ICD-10-CM | POA: Diagnosis not present

## 2017-06-06 DIAGNOSIS — T82598A Other mechanical complication of other cardiac and vascular devices and implants, initial encounter: Secondary | ICD-10-CM | POA: Diagnosis not present

## 2017-06-06 DIAGNOSIS — N2581 Secondary hyperparathyroidism of renal origin: Secondary | ICD-10-CM | POA: Diagnosis not present

## 2017-06-06 DIAGNOSIS — N186 End stage renal disease: Secondary | ICD-10-CM | POA: Diagnosis not present

## 2017-06-06 DIAGNOSIS — D631 Anemia in chronic kidney disease: Secondary | ICD-10-CM | POA: Diagnosis not present

## 2017-06-06 DIAGNOSIS — Z992 Dependence on renal dialysis: Secondary | ICD-10-CM | POA: Diagnosis not present

## 2017-06-06 DIAGNOSIS — D689 Coagulation defect, unspecified: Secondary | ICD-10-CM | POA: Diagnosis not present

## 2017-06-09 DIAGNOSIS — D689 Coagulation defect, unspecified: Secondary | ICD-10-CM | POA: Diagnosis not present

## 2017-06-09 DIAGNOSIS — M869 Osteomyelitis, unspecified: Secondary | ICD-10-CM | POA: Diagnosis not present

## 2017-06-09 DIAGNOSIS — R509 Fever, unspecified: Secondary | ICD-10-CM | POA: Diagnosis not present

## 2017-06-09 DIAGNOSIS — T82598A Other mechanical complication of other cardiac and vascular devices and implants, initial encounter: Secondary | ICD-10-CM | POA: Diagnosis not present

## 2017-06-09 DIAGNOSIS — N186 End stage renal disease: Secondary | ICD-10-CM | POA: Diagnosis not present

## 2017-06-09 DIAGNOSIS — D509 Iron deficiency anemia, unspecified: Secondary | ICD-10-CM | POA: Diagnosis not present

## 2017-06-09 DIAGNOSIS — E1029 Type 1 diabetes mellitus with other diabetic kidney complication: Secondary | ICD-10-CM | POA: Diagnosis not present

## 2017-06-09 DIAGNOSIS — Z992 Dependence on renal dialysis: Secondary | ICD-10-CM | POA: Diagnosis not present

## 2017-06-09 DIAGNOSIS — D631 Anemia in chronic kidney disease: Secondary | ICD-10-CM | POA: Diagnosis not present

## 2017-06-09 DIAGNOSIS — N2581 Secondary hyperparathyroidism of renal origin: Secondary | ICD-10-CM | POA: Diagnosis not present

## 2017-06-11 ENCOUNTER — Other Ambulatory Visit: Payer: Self-pay | Admitting: Orthopedic Surgery

## 2017-06-11 DIAGNOSIS — I96 Gangrene, not elsewhere classified: Secondary | ICD-10-CM | POA: Diagnosis not present

## 2017-06-11 DIAGNOSIS — D631 Anemia in chronic kidney disease: Secondary | ICD-10-CM | POA: Diagnosis not present

## 2017-06-11 DIAGNOSIS — D689 Coagulation defect, unspecified: Secondary | ICD-10-CM | POA: Diagnosis not present

## 2017-06-11 DIAGNOSIS — M869 Osteomyelitis, unspecified: Secondary | ICD-10-CM | POA: Diagnosis not present

## 2017-06-11 DIAGNOSIS — D509 Iron deficiency anemia, unspecified: Secondary | ICD-10-CM | POA: Diagnosis not present

## 2017-06-11 DIAGNOSIS — E1029 Type 1 diabetes mellitus with other diabetic kidney complication: Secondary | ICD-10-CM | POA: Diagnosis not present

## 2017-06-11 DIAGNOSIS — N2581 Secondary hyperparathyroidism of renal origin: Secondary | ICD-10-CM | POA: Diagnosis not present

## 2017-06-11 DIAGNOSIS — R509 Fever, unspecified: Secondary | ICD-10-CM | POA: Diagnosis not present

## 2017-06-11 DIAGNOSIS — N186 End stage renal disease: Secondary | ICD-10-CM | POA: Diagnosis not present

## 2017-06-11 DIAGNOSIS — Z992 Dependence on renal dialysis: Secondary | ICD-10-CM | POA: Diagnosis not present

## 2017-06-11 DIAGNOSIS — T82598A Other mechanical complication of other cardiac and vascular devices and implants, initial encounter: Secondary | ICD-10-CM | POA: Diagnosis not present

## 2017-06-12 ENCOUNTER — Encounter (HOSPITAL_COMMUNITY): Payer: Self-pay | Admitting: *Deleted

## 2017-06-12 ENCOUNTER — Other Ambulatory Visit: Payer: Self-pay

## 2017-06-12 NOTE — Progress Notes (Signed)
  Pt denies any acute cardiopulmonary issues. Pt denies being under the care of a cardiologist.  Pt stated that a stress test was performed 10 years ago and a cardiac cath 25 years ago. Pt denies having a chest x ray within the last year. Pt made aware to stop taking otc vitamins, fish oil and herbal medications. Do not take any NSAIDs ie: Ibuprofen, Advil, Naproxen (Aleve), Motrin, BC and Goody Powder. Pt made aware to not take Glipizide the DOS. Pt made aware to check BG every 2 hours prior to arrival to hospital on DOS. Pt made aware to treat a BG < 70 with 4 ounces of apple juice, wait 15 minutes after intervention to recheck BG, if BG remains < 70, call Short Stay unit to speak with a nurse. Pt verbalized understanding of all pre-op instructions.

## 2017-06-13 ENCOUNTER — Encounter (HOSPITAL_COMMUNITY)
Admission: RE | Disposition: A | Discharge status: Home / Self Care | Payer: Self-pay | Source: Ambulatory Visit | Attending: Orthopedic Surgery

## 2017-06-13 ENCOUNTER — Ambulatory Visit (HOSPITAL_COMMUNITY): Payer: Medicare Other | Admitting: Certified Registered"

## 2017-06-13 ENCOUNTER — Ambulatory Visit (HOSPITAL_COMMUNITY)
Admission: RE | Admit: 2017-06-13 | Discharge: 2017-06-13 | Disposition: A | Discharge status: Home / Self Care | Payer: Medicare Other | Source: Ambulatory Visit | Attending: Orthopedic Surgery | Admitting: Orthopedic Surgery

## 2017-06-13 ENCOUNTER — Encounter (HOSPITAL_COMMUNITY): Payer: Self-pay | Admitting: *Deleted

## 2017-06-13 DIAGNOSIS — E039 Hypothyroidism, unspecified: Secondary | ICD-10-CM | POA: Insufficient documentation

## 2017-06-13 DIAGNOSIS — C9000 Multiple myeloma not having achieved remission: Secondary | ICD-10-CM | POA: Insufficient documentation

## 2017-06-13 DIAGNOSIS — M86141 Other acute osteomyelitis, right hand: Secondary | ICD-10-CM | POA: Diagnosis not present

## 2017-06-13 DIAGNOSIS — R011 Cardiac murmur, unspecified: Secondary | ICD-10-CM | POA: Insufficient documentation

## 2017-06-13 DIAGNOSIS — I96 Gangrene, not elsewhere classified: Secondary | ICD-10-CM | POA: Diagnosis not present

## 2017-06-13 DIAGNOSIS — E1122 Type 2 diabetes mellitus with diabetic chronic kidney disease: Secondary | ICD-10-CM | POA: Insufficient documentation

## 2017-06-13 DIAGNOSIS — Z992 Dependence on renal dialysis: Secondary | ICD-10-CM | POA: Diagnosis not present

## 2017-06-13 DIAGNOSIS — Z91012 Allergy to eggs: Secondary | ICD-10-CM | POA: Diagnosis not present

## 2017-06-13 DIAGNOSIS — M1990 Unspecified osteoarthritis, unspecified site: Secondary | ICD-10-CM | POA: Insufficient documentation

## 2017-06-13 DIAGNOSIS — Z8614 Personal history of Methicillin resistant Staphylococcus aureus infection: Secondary | ICD-10-CM | POA: Diagnosis not present

## 2017-06-13 DIAGNOSIS — N2581 Secondary hyperparathyroidism of renal origin: Secondary | ICD-10-CM | POA: Insufficient documentation

## 2017-06-13 DIAGNOSIS — Z89512 Acquired absence of left leg below knee: Secondary | ICD-10-CM | POA: Diagnosis not present

## 2017-06-13 DIAGNOSIS — Z87891 Personal history of nicotine dependence: Secondary | ICD-10-CM | POA: Diagnosis not present

## 2017-06-13 DIAGNOSIS — Z91030 Bee allergy status: Secondary | ICD-10-CM | POA: Diagnosis not present

## 2017-06-13 DIAGNOSIS — J45909 Unspecified asthma, uncomplicated: Secondary | ICD-10-CM | POA: Diagnosis not present

## 2017-06-13 DIAGNOSIS — Z888 Allergy status to other drugs, medicaments and biological substances status: Secondary | ICD-10-CM | POA: Insufficient documentation

## 2017-06-13 DIAGNOSIS — Z7982 Long term (current) use of aspirin: Secondary | ICD-10-CM | POA: Insufficient documentation

## 2017-06-13 DIAGNOSIS — Z89431 Acquired absence of right foot: Secondary | ICD-10-CM | POA: Diagnosis not present

## 2017-06-13 DIAGNOSIS — Z79899 Other long term (current) drug therapy: Secondary | ICD-10-CM | POA: Insufficient documentation

## 2017-06-13 DIAGNOSIS — E1152 Type 2 diabetes mellitus with diabetic peripheral angiopathy with gangrene: Secondary | ICD-10-CM | POA: Diagnosis not present

## 2017-06-13 DIAGNOSIS — I120 Hypertensive chronic kidney disease with stage 5 chronic kidney disease or end stage renal disease: Secondary | ICD-10-CM | POA: Diagnosis not present

## 2017-06-13 DIAGNOSIS — N186 End stage renal disease: Secondary | ICD-10-CM | POA: Diagnosis not present

## 2017-06-13 DIAGNOSIS — Z885 Allergy status to narcotic agent status: Secondary | ICD-10-CM | POA: Diagnosis not present

## 2017-06-13 HISTORY — PX: AMPUTATION: SHX166

## 2017-06-13 HISTORY — DX: Other specified postprocedural states: Z98.890

## 2017-06-13 HISTORY — DX: Other specified postprocedural states: R11.2

## 2017-06-13 LAB — POCT I-STAT 4, (NA,K, GLUC, HGB,HCT)
GLUCOSE: 153 mg/dL — AB (ref 65–99)
HCT: 36 % — ABNORMAL LOW (ref 39.0–52.0)
Hemoglobin: 12.2 g/dL — ABNORMAL LOW (ref 13.0–17.0)
Potassium: 3.4 mmol/L — ABNORMAL LOW (ref 3.5–5.1)
Sodium: 144 mmol/L (ref 135–145)

## 2017-06-13 LAB — GLUCOSE, CAPILLARY: Glucose-Capillary: 84 mg/dL (ref 65–99)

## 2017-06-13 SURGERY — AMPUTATION DIGIT
Anesthesia: General | Site: Finger | Laterality: Right

## 2017-06-13 MED ORDER — LIDOCAINE 2% (20 MG/ML) 5 ML SYRINGE
INTRAMUSCULAR | Status: AC
  Filled 2017-06-13: qty 5

## 2017-06-13 MED ORDER — DEXAMETHASONE SODIUM PHOSPHATE 10 MG/ML IJ SOLN
INTRAMUSCULAR | Status: AC
  Filled 2017-06-13: qty 1

## 2017-06-13 MED ORDER — PHENYLEPHRINE 40 MCG/ML (10ML) SYRINGE FOR IV PUSH (FOR BLOOD PRESSURE SUPPORT)
PREFILLED_SYRINGE | INTRAVENOUS | Status: AC
  Filled 2017-06-13: qty 10

## 2017-06-13 MED ORDER — OXYCODONE HCL 5 MG PO TABS: 5.0000 mg | ORAL_TABLET | Freq: Once | ORAL | Status: DC | PRN

## 2017-06-13 MED ORDER — PROPOFOL 10 MG/ML IV BOLUS
INTRAVENOUS | Status: AC
  Filled 2017-06-13: qty 20

## 2017-06-13 MED ORDER — FENTANYL CITRATE (PF) 250 MCG/5ML IJ SOLN
INTRAMUSCULAR | Status: AC
  Filled 2017-06-13: qty 5

## 2017-06-13 MED ORDER — PROMETHAZINE HCL 25 MG/ML IJ SOLN: 6.2500 mg | INTRAMUSCULAR | Status: DC | PRN

## 2017-06-13 MED ORDER — GLYCOPYRROLATE PF 0.2 MG/ML IJ SOSY
PREFILLED_SYRINGE | INTRAMUSCULAR | Status: DC | PRN
  Administered 2017-06-13 (×2): .2 mg via INTRAVENOUS

## 2017-06-13 MED ORDER — BUPIVACAINE HCL 0.25 % IJ SOLN
INTRAMUSCULAR | Status: DC | PRN
  Administered 2017-06-13: 9 mL

## 2017-06-13 MED ORDER — OXYCODONE HCL 5 MG/5ML PO SOLN: 5.0000 mg | Freq: Once | ORAL | Status: DC | PRN

## 2017-06-13 MED ORDER — 0.9 % SODIUM CHLORIDE (POUR BTL) OPTIME
TOPICAL | Status: DC | PRN
  Administered 2017-06-13: 1000 mL

## 2017-06-13 MED ORDER — ONDANSETRON HCL 4 MG/2ML IJ SOLN
INTRAMUSCULAR | Status: AC
  Filled 2017-06-13: qty 2

## 2017-06-13 MED ORDER — DEXAMETHASONE SODIUM PHOSPHATE 10 MG/ML IJ SOLN
INTRAMUSCULAR | Status: DC | PRN
  Administered 2017-06-13: 4 mg via INTRAVENOUS

## 2017-06-13 MED ORDER — OXYCODONE-ACETAMINOPHEN 5-325 MG PO TABS: ORAL_TABLET | ORAL | 0 refills | Status: DC

## 2017-06-13 MED ORDER — HYDROMORPHONE HCL 2 MG/ML IJ SOLN: 0.3000 mg | INTRAMUSCULAR | Status: DC | PRN

## 2017-06-13 MED ORDER — CHLORHEXIDINE GLUCONATE 4 % EX LIQD: 60.0000 mL | Freq: Once | CUTANEOUS | Status: DC

## 2017-06-13 MED ORDER — PROPOFOL 10 MG/ML IV BOLUS
INTRAVENOUS | Status: DC | PRN
  Administered 2017-06-13: 150 mg via INTRAVENOUS

## 2017-06-13 MED ORDER — PHENYLEPHRINE HCL 10 MG/ML IJ SOLN
INTRAMUSCULAR | Status: DC | PRN
  Administered 2017-06-13: 25 ug/min via INTRAVENOUS

## 2017-06-13 MED ORDER — CEFAZOLIN SODIUM-DEXTROSE 2-4 GM/100ML-% IV SOLN
2.0000 g | INTRAVENOUS | Status: AC
  Administered 2017-06-13: 2 g via INTRAVENOUS

## 2017-06-13 MED ORDER — PHENYLEPHRINE 40 MCG/ML (10ML) SYRINGE FOR IV PUSH (FOR BLOOD PRESSURE SUPPORT)
PREFILLED_SYRINGE | INTRAVENOUS | Status: DC | PRN
  Administered 2017-06-13 (×5): 80 ug via INTRAVENOUS

## 2017-06-13 MED ORDER — FENTANYL CITRATE (PF) 250 MCG/5ML IJ SOLN
INTRAMUSCULAR | Status: DC | PRN
  Administered 2017-06-13 (×4): 25 ug via INTRAVENOUS

## 2017-06-13 MED ORDER — SODIUM CHLORIDE 0.9 % IV SOLN
INTRAVENOUS | Status: DC
  Administered 2017-06-13: 10:00:00 via INTRAVENOUS

## 2017-06-13 MED ORDER — ONDANSETRON HCL 4 MG/2ML IJ SOLN
INTRAMUSCULAR | Status: DC | PRN
  Administered 2017-06-13: 4 mg via INTRAVENOUS

## 2017-06-13 MED ORDER — CEFAZOLIN SODIUM 1 G IJ SOLR
INTRAMUSCULAR | Status: AC
  Filled 2017-06-13: qty 20

## 2017-06-13 MED ORDER — PHENYLEPHRINE HCL 10 MG/ML IJ SOLN
INTRAMUSCULAR | Status: AC
  Filled 2017-06-13: qty 1

## 2017-06-13 MED ORDER — BUPIVACAINE HCL (PF) 0.25 % IJ SOLN
INTRAMUSCULAR | Status: AC
  Filled 2017-06-13: qty 30

## 2017-06-13 MED ORDER — LIDOCAINE 2% (20 MG/ML) 5 ML SYRINGE
INTRAMUSCULAR | Status: DC | PRN
  Administered 2017-06-13: 100 mg via INTRAVENOUS

## 2017-06-13 SURGICAL SUPPLY — 36 items
BANDAGE COBAN STERILE 2 (GAUZE/BANDAGES/DRESSINGS) ×3 IMPLANT
BLADE LONG MED 31MMX9MM (MISCELLANEOUS) ×1
BLADE LONG MED 31X9 (MISCELLANEOUS) ×2 IMPLANT
BNDG COHESIVE 1X5 TAN STRL LF (GAUZE/BANDAGES/DRESSINGS) ×3 IMPLANT
BNDG ESMARK 4X9 LF (GAUZE/BANDAGES/DRESSINGS) ×3 IMPLANT
BNDG GAUZE ELAST 4 BULKY (GAUZE/BANDAGES/DRESSINGS) ×3 IMPLANT
CORDS BIPOLAR (ELECTRODE) ×3 IMPLANT
CUFF TOURNIQUET SINGLE 18IN (TOURNIQUET CUFF) ×3 IMPLANT
CUFF TOURNIQUET SINGLE 24IN (TOURNIQUET CUFF) IMPLANT
DURAPREP 26ML APPLICATOR (WOUND CARE) ×3 IMPLANT
GAUZE SPONGE 4X4 12PLY STRL (GAUZE/BANDAGES/DRESSINGS) ×3 IMPLANT
GAUZE XEROFORM 1X8 LF (GAUZE/BANDAGES/DRESSINGS) ×3 IMPLANT
GLOVE BIO SURGEON STRL SZ7.5 (GLOVE) ×3 IMPLANT
GLOVE BIOGEL PI IND STRL 8 (GLOVE) ×1 IMPLANT
GLOVE BIOGEL PI INDICATOR 8 (GLOVE) ×2
GOWN STRL REUS W/ TWL LRG LVL3 (GOWN DISPOSABLE) ×1 IMPLANT
GOWN STRL REUS W/ TWL XL LVL3 (GOWN DISPOSABLE) ×1 IMPLANT
GOWN STRL REUS W/TWL LRG LVL3 (GOWN DISPOSABLE) ×2
GOWN STRL REUS W/TWL XL LVL3 (GOWN DISPOSABLE) ×2
KIT BASIN OR (CUSTOM PROCEDURE TRAY) ×3 IMPLANT
KIT TURNOVER KIT B (KITS) ×3 IMPLANT
NEEDLE HYPO 25GX1X1/2 BEV (NEEDLE) IMPLANT
NS IRRIG 1000ML POUR BTL (IV SOLUTION) ×3 IMPLANT
PACK ORTHO EXTREMITY (CUSTOM PROCEDURE TRAY) ×3 IMPLANT
PAD ARMBOARD 7.5X6 YLW CONV (MISCELLANEOUS) ×6 IMPLANT
PAD CAST 4YDX4 CTTN HI CHSV (CAST SUPPLIES) IMPLANT
PADDING CAST COTTON 4X4 STRL (CAST SUPPLIES)
SPECIMEN JAR SMALL (MISCELLANEOUS) ×3 IMPLANT
SUT CHROMIC 6 0 PS 4 (SUTURE) IMPLANT
SUT MON AB 5-0 PS2 18 (SUTURE) IMPLANT
SUT VIC AB 4-0 P-3 18X BRD (SUTURE) ×1 IMPLANT
SUT VIC AB 4-0 P3 18 (SUTURE) ×2
SUT VICRYL 4-0 PS2 18IN ABS (SUTURE) IMPLANT
SYR CONTROL 10ML LL (SYRINGE) IMPLANT
TOWEL OR 17X26 10 PK STRL BLUE (TOWEL DISPOSABLE) ×3 IMPLANT
UNDERPAD 30X30 (UNDERPADS AND DIAPERS) ×3 IMPLANT

## 2017-06-13 NOTE — Anesthesia Preprocedure Evaluation (Signed)
Anesthesia Evaluation  Patient identified by MRN, date of birth, ID band Patient awake    Reviewed: Allergy & Precautions, NPO status , Patient's Chart, lab work & pertinent test results  Airway Mallampati: II  TM Distance: >3 FB Neck ROM: Full    Dental  (+) Dental Advisory Given   Pulmonary former smoker,    breath sounds clear to auscultation       Cardiovascular hypertension, + Peripheral Vascular Disease (s/p BKA)   Rhythm:Regular Rate:Normal  '16 ECHO: EF 60-65%, valves OK   Neuro/Psych    GI/Hepatic   Endo/Other  diabetes, Oral Hypoglycemic AgentsHypothyroidism   Renal/GU ESRF and DialysisRenal disease (MWF)     Musculoskeletal   Abdominal   Peds  Hematology  (+) Blood dyscrasia (Hb 9.4), ,   Anesthesia Other Findings Multiple myeloma  Reproductive/Obstetrics                             Lab Results  Component Value Date   WBC 7.0 04/04/2017   HGB 12.2 (L) 06/13/2017   HCT 36.0 (L) 06/13/2017   MCV 87.1 04/04/2017   PLT 366 04/04/2017   Lab Results  Component Value Date   CREATININE 6.31 (H) 04/04/2017   BUN 29 (H) 04/04/2017   NA 144 06/13/2017   K 3.4 (L) 06/13/2017   CL 99 (L) 04/04/2017   CO2 24 04/04/2017    Anesthesia Physical  Anesthesia Plan  ASA: III  Anesthesia Plan: General   Post-op Pain Management:    Induction: Intravenous  PONV Risk Score and Plan: 2 and Dexamethasone, Ondansetron and Treatment may vary due to age or medical condition  Airway Management Planned: LMA  Additional Equipment:   Intra-op Plan:   Post-operative Plan: Extubation in OR  Informed Consent: I have reviewed the patients History and Physical, chart, labs and discussed the procedure including the risks, benefits and alternatives for the proposed anesthesia with the patient or authorized representative who has indicated his/her understanding and acceptance.   Dental  advisory given  Plan Discussed with: CRNA  Anesthesia Plan Comments:         Anesthesia Quick Evaluation

## 2017-06-13 NOTE — Op Note (Signed)
NAME: Jack [name] MEDICAL RECORD NO: [id number] DATE OF BIRTH: [birth year] FACILITY: [facility] LOCATION: [facility] OR PHYSICIAN: Tennis Must, MD   OPERATIVE REPORT   DATE OF PROCEDURE: 06/13/17    PREOPERATIVE DIAGNOSIS:   Right index finger necrosis   POSTOPERATIVE DIAGNOSIS:   Right index finger necrosis   PROCEDURE:   Right index finger amputation through MP joint   SURGEON:  Leanora Cover, M.D.   ASSISTANT: none   ANESTHESIA:  General   INTRAVENOUS FLUIDS:  Per anesthesia flow sheet.   ESTIMATED BLOOD LOSS:  Minimal.   COMPLICATIONS:  None.   SPECIMENS:  none   TOURNIQUET TIME:    Total Tourniquet Time Documented: Forearm (Right) - 52 minutes Total: Forearm (Right) - 52 minutes    DISPOSITION:  Stable to PACU.   INDICATIONS: 72 year old male on dialysis.  He has had previous finger necrosis requiring amputation at the MP joint level.  He presented with necrosis of the distal aspect of the right index finger over the past 3 weeks.  He wishes to have an amputation of the finger for management of the issue. Risks, benefits and alternatives of surgery were discussed including the risks of blood loss, infection, damage to nerves, vessels, tendons, ligaments, bone for surgery, need for additional surgery, complications with wound healing, continued pain, nonunion, malunion, stiffness.  He voiced understanding of these risks and elected to proceed.  OPERATIVE COURSE:  After being identified preoperatively by myself,  the patient and I agreed on the procedure and site of the procedure.  The surgical site was marked.  Surgical consent had been signed. He was given IV Ancef as preoperative antibiotic prophylaxis. He was transferred to the operating room and placed on the operating table in supine position with the Right upper extremity on an arm board.  General anesthesia was induced by the anesthesiologist.  Right upper extremity was prepped and draped in normal sterile  orthopedic fashion.  A surgical pause was performed between the surgeons, anesthesia, and operating room staff and all were in agreement as to the patient, procedure, and site of procedure.  Tourniquet at the proximal aspect of the forearm was inflated to 250 mmHg after exsanguination of the arm with an Esmarch bandage.  incision was made at the base of the index finger.  This is carried in subcutaneous tissues by spreading technique the bipolar cautery was used to obtain hemostasis.  The digital neurovascular bundles were identified.  The nerve was placed under traction and bipolar and allowed to retract.  The artery was tied with a 4-0 Vicryl suture and then bipolar distal to the suture and allowed to retract.  Bipolar cautery was used to obtain hemostasis.  The tendons were released.  The finger was amputated through the MP joint.  The wound was then copiously irrigated with sterile saline.  The skin edges were reapproximated using 5-0 Monocryl suture.  The skin edges reapproximated nicely without tension.  It was felt that amputation of the distal aspect of the metacarpal head was not necessary.  There was good bleeding from the skin edges at the level of the amputation.  The area was injected with quarter percent plain Marcaine to aid in postoperative analgesia.  The wound was dressed with sterile Xeroform 4 x 4's and wrapped with a Kerlix and open dressing lightly.  The tourniquet was deflated at 52 minutes.  Fingertips were pink with brisk capillary refill after deflation of tourniquet.  The operative  drapes were broken down.  The patient was awoken from anesthesia safely.  He was transferred back to the stretcher and taken to PACU in stable condition.  I will see him back in the office in 1 week for postoperative followup.  I will give him a prescription for Percocet 5/325 1-2 tabs PO q6 hours prn pain, dispense # 20.

## 2017-06-13 NOTE — Transfer of Care (Signed)
Immediate Anesthesia Transfer of Care Note  Patient: Jack [name]  Procedure(s) Performed: INDEX FINGER RIGHT AMPUTATION (Right Finger)  Patient Location: PACU  Anesthesia Type:General  Level of Consciousness: awake, drowsy and patient cooperative  Airway & Oxygen Therapy: Patient Spontanous Breathing and Patient connected to face mask oxygen  Post-op Assessment: Report given to RN and Post -op Vital signs reviewed and stable  Post vital signs: Reviewed and stable  Last Vitals:  Vitals Value Taken Time  BP 109/37 06/13/2017  1:40 PM  Temp    Pulse 74 06/13/2017  1:42 PM  Resp 6 06/13/2017  1:42 PM  SpO2 100 % 06/13/2017  1:42 PM  Vitals shown include unvalidated device data.  Last Pain:  Vitals:   06/13/17 0910  TempSrc:   PainSc: 6          Complications: No apparent anesthesia complications

## 2017-06-13 NOTE — Discharge Instructions (Addendum)

## 2017-06-13 NOTE — Anesthesia Procedure Notes (Signed)
Procedure Name: LMA Insertion Date/Time: 06/13/2017 12:27 PM Performed by: Renato Shin, CRNA Pre-anesthesia Checklist: Patient identified, Emergency Drugs available, Suction available and Patient being monitored Patient Re-evaluated:Patient Re-evaluated prior to induction Oxygen Delivery Method: Circle system utilized Preoxygenation: Pre-oxygenation with 100% oxygen Induction Type: IV induction LMA: LMA with gastric port inserted LMA Size: 4.0 Number of attempts: 1 Placement Confirmation: positive ETCO2,  CO2 detector and breath sounds checked- equal and bilateral Tube secured with: Tape Dental Injury: Teeth and Oropharynx as per pre-operative assessment

## 2017-06-13 NOTE — H&P (Signed)
Jack [name] is an 72 y.o. male.   Chief Complaint: right index finger gangrene HPI: 72 yo male with necrosis of distal aspect right index finger over past few weeks.  Nail has come off with necrotic tissue underneath.  Inflamed tissue into middle phalanx.  He wishes to have amputation of right index finger for management.  Has had previous necrosis of digits that required amputation for healing.  Allergies:  Allergies  Allergen Reactions  . Bee Venom Anaphylaxis  . Ivp Dye [Iodinated Diagnostic Agents] Hives  . Lisinopril Cough  . Morphine And Related Hives and Other (See Comments)    Bradycardia     Past Medical History:  Diagnosis Date  . Anemia   . Arthritis   . Asthma   . Contracture of joint    left knee  . Decubitus ulcer of sacral region, stage 2 03/07/2014  . Diabetes mellitus with peripheral vascular disease ([place])   . End-stage renal disease on hemodialysis [facility])    Started HD March 2014.  Cause of ESRD was DM.  Gets HD at [facility] on [address] on MWF schedule.   Marland Kitchen ESRD on hemodialysis ([place])   . Gangrene ([place])    right BKA  . Gangrene ([place])    right index finger  . Gangrene of foot ([place])   . Glaucoma   . Glaucoma 03/07/2014  . Heart murmur   . History of MRSA infection 04/22/2013   Bacteremia assoc w L foot wound infection Mar 2015   . Hyperparathyroidism, secondary renal ([place])   . Hypertension   . Hypothyroidism   . MRSA bacteremia   . Multiple myeloma, without mention of having achieved remission 03/30/2012   Cytogenetic neg on 03/23/2012.  Marland Kitchen Necrosis ([place])    left long finger  . Peripheral arterial disease ([place])   . Peripheral vascular disease, unspecified ([place]) 11/19/2012   In the past had R foot toe amps then R TMA. In 2015 had left foot toe amp > then TMA >then L BKA on 05/14/13   . Pneumonia    2012  . PONV (postoperative nausea and vomiting)   . Thyroid disease    hyperparathyroidism    Past Surgical History:  Procedure Laterality Date  .  ABDOMINAL AORTAGRAM Bilateral 11/06/2012   Procedure: ABDOMINAL AORTAGRAM;  Surgeon: Elam Dutch, MD;  Location: [facility] CATH LAB;  Service: Cardiovascular;  Laterality: Bilateral;  . AMPUTATION Right 11/10/2012   Procedure: AMPUTATION FIRST and SECOND TOES Right Foot;  Surgeon: Elam Dutch, MD;  Location: [place];  Service: Vascular;  Laterality: Right;  . AMPUTATION Left 04/07/2013   Procedure: AMPUTATION DIGIT- LEFT 1ST TOE;  Surgeon: Mal Misty, MD;  Location: [place];  Service: Vascular;  Laterality: Left;  . AMPUTATION Left 04/26/2013   Procedure: Left Foot Transmetatarsal Amputation;  Surgeon: Newt Minion, MD;  Location: [place];  Service: Orthopedics;  Laterality: Left;  . AMPUTATION Left 05/14/2013   Procedure: AMPUTATION BELOW KNEE;  Surgeon: Newt Minion, MD;  Location: [place];  Service: Orthopedics;  Laterality: Left;  Left Below Knee Amputation  . AMPUTATION Left 07/23/2013   Procedure: AMPUTATION BELOW KNEE;  Surgeon: Newt Minion, MD;  Location: [place];  Service: Orthopedics;  Laterality: Left;  Left Below Knee Amputation Revision  . AMPUTATION Right 02/18/2014   Procedure: AMPUTATION BELOW KNEE;  Surgeon: Newt Minion, MD;  Location: [place];  Service: Orthopedics;  Laterality: Right;  . AMPUTATION Right 08/20/2016   Procedure:  RIGHT LONG FINGER AMPUTATION;  Surgeon: Leanora Cover, MD;  Location: [place];  Service: Orthopedics;  Laterality: Right;  . AMPUTATION Right 01/11/2017   Procedure: RIGHT LONG FINGER AMPUTATION;  Surgeon: Leanora Cover, MD;  Location: [place];  Service: Orthopedics;  Laterality: Right;  . AMPUTATION Left 04/04/2017   Procedure: AMPUTATION DIGIT LEFT LONG FINGER;  Surgeon: Leanora Cover, MD;  Location: [place];  Service: Orthopedics;  Laterality: Left;  . AV FISTULA PLACEMENT Left 03/25/2012   Procedure: ARTERIOVENOUS (AV) FISTULA CREATION;  Surgeon: Mal Misty, MD;  Location: [place];  Service: Vascular;  Laterality: Left;  . BELOW KNEE LEG  AMPUTATION Left 05/14/2013   DR [name]  . BELOW KNEE LEG AMPUTATION Right 02/18/2014   dr duda  . CARDIAC CATHETERIZATION     approx 30 years ago  . CERVICAL DISC SURGERY    . EYE SURGERY Bilateral    CATARACTS  . I&D EXTREMITY Left 04/22/2013   Procedure: IRRIGATION AND DEBRIDEMENT LEFT FIRST TOE AMPUTATION WOUND ;  Surgeon: Mal Misty, MD;  Location: [place];  Service: Vascular;  Laterality: Left;  . I&D EXTREMITY Right 01/05/2014   Procedure: IRRIGATION AND DEBRIDEMENT Right Heel Ulcer;  Surgeon: Mcarthur Rossetti, MD;  Location: [place];  Service: Orthopedics;  Laterality: Right;  Surgeon Available after 5PM  . INSERTION OF DIALYSIS CATHETER Right 03/19/2012   Procedure: INSERTION OF DIALYSIS CATHETER;  Surgeon: Mal Misty, MD;  Location: [place];  Service: Vascular;  Laterality: Right;  Right Internal Jugular  . LIGATION OF COMPETING BRANCHES OF ARTERIOVENOUS FISTULA Left 05/08/2012   Procedure: LIGATION OF COMPETING BRANCHES OF ARTERIOVENOUS FISTULA;  Surgeon: Mal Misty, MD;  Location: [place];  Service: Vascular;  Laterality: Left;  Ultrasound guided  . REPAIR QUADRICEPS/HAMSTRING MUSCLES Left 05/20/2014   Procedure: Left Hamstring Release;  Surgeon: Newt Minion, MD;  Location: [place];  Service: Orthopedics;  Laterality: Left;  . REVISON OF ARTERIOVENOUS FISTULA Left 01/30/2016   Procedure: CEPHALIC VEIN TURNDOWN TO LEFT UPPER ARM;  Surgeon: Elam Dutch, MD;  Location: [place];  Service: Vascular;  Laterality: Left;  . STUMP REVISION Right 05/20/2014   Procedure: Revision Right Below Knee Amputation;  Surgeon: Newt Minion, MD;  Location: [place];  Service: Orthopedics;  Laterality: Right;  . TEE WITHOUT CARDIOVERSION N/A 04/20/2013   Procedure: TRANSESOPHAGEAL ECHOCARDIOGRAM (TEE);  Surgeon: Josue Hector, MD;  Location[facility] ENDOSCOPY;  Service: Cardiovascular;  Laterality: N/A;  . THYROIDECTOMY    . TOE AMPUTATION     D/C 04-30-13  . TRANSMETATARSAL AMPUTATION Left 12/16/2012    Procedure: TRANSMETATARSAL AMPUTATION AND VAC PLACEMENT;  Surgeon: Elam Dutch, MD;  Location: [facility] OR;  Service: Vascular;  Laterality: Left;    Family History: Family History  Problem Relation Age of Onset  . Diabetes Mother   . Cancer Mother        bone   . Kidney disease Father     Social History:   reports that he quit smoking about 35 years ago. His smoking use included cigarettes. He has never used smokeless tobacco. He reports that he does not drink alcohol or use drugs.  Medications: Medications Prior to Admission  Medication Sig Dispense Refill  . aspirin EC 81 MG tablet Take 81 mg by mouth daily.    Marland Kitchen atorvastatin (LIPITOR) 10 MG tablet TAKE 1 TABLET (10 MG TOTAL) BY MOUTH DAILY. 90 tablet 1  . lanthanum (FOSRENOL) 1000 MG chewable tablet  Chew 1,000 mg by mouth daily.   6  . levothyroxine (SYNTHROID, LEVOTHROID) 150 MCG tablet TAKE 1/2 TABLET BY MOUTH ON MONDAYS AND 1 TABLET BY MOUTH THE REST OF THE WEEK. (Patient taking differently: TAKE 157mg TABLET BY MOUTH DAILY) 90 tablet 1  . multivitamin (RENA-VIT) TABS tablet Take 1 tablet by mouth 4 (four) times a week.     . naproxen sodium (ALEVE) 220 MG tablet Take 220 mg by mouth daily as needed (pain).    .Marland KitchenoxyCODONE-acetaminophen (PERCOCET) 5-325 MG tablet 1-2 tabs po q6 hours prn pain 20 tablet 0  . Continuous Blood Gluc Sensor (FREESTYLE LIBRE 14 DAY SENSOR) MISC USE DAILY*NOT COVERED* 1 each 1  . oxyCODONE-acetaminophen (ROXICET) 5-325 MG tablet Take 1 tablet every 12 (twelve) hours as needed by mouth for moderate pain or severe pain. (Patient not taking: Reported on 06/11/2017) 60 tablet 0    Results for orders placed or performed during the hospital encounter of 06/13/17 (from the past 48 hour(s))  I-STAT 4, (NA,K, GLUC, HGB,HCT)     Status: Abnormal   Collection Time: 06/13/17  9:17 AM  Result Value Ref Range   Sodium 144 135 - 145 mmol/L   Potassium 3.4 (L) 3.5 - 5.1 mmol/L   Glucose, Bld 153 (H) 65 - 99 mg/dL    HCT 36.0 (L) 39.0 - 52.0 %   Hemoglobin 12.2 (L) 13.0 - 17.0 g/dL    No results found.   A comprehensive review of systems was negative.  Blood pressure (!) 164/94, pulse 91, temperature 98.4 F (36.9 C), temperature source Oral, resp. rate 16, height '5\' 10"'  (1.778 m), weight 72.6 kg (160 lb), SpO2 99 %.  General appearance: alert, cooperative and appears stated age Head: Normocephalic, without obvious abnormality, atraumatic Neck: supple, symmetrical, trachea midline Cardio: regular rate and rhythm Resp: clear to auscultation bilaterally Extremities: Intact sensation and capillary refill all digits.  +epl/fpl/io.  Right index finger with necrosis of nail bed dorsally and inflammation into middle phalanx.  No signs of infection at this time. Pulses: 2+ and symmetric Skin: Skin color, texture, turgor normal. No rashes or lesions Neurologic: Grossly normal Incision/Wound: as above  Assessment/Plan Right index finger necrosis.  Plan amputation of right index finger with possible partial ray amputation.  Has had previous finger necrosis requiring amputation.  Non operative and operative treatment options were discussed with the patient and patient wishes to proceed with operative treatment. Risks, benefits, and alternatives of surgery were discussed and the patient agrees with the plan of care.   Ellicia Alix R 06/13/2017, 12:09 PM

## 2017-06-16 ENCOUNTER — Encounter (HOSPITAL_COMMUNITY): Payer: Self-pay | Admitting: Orthopedic Surgery

## 2017-06-16 ENCOUNTER — Other Ambulatory Visit: Payer: Self-pay | Admitting: Family Medicine

## 2017-06-16 DIAGNOSIS — N186 End stage renal disease: Secondary | ICD-10-CM | POA: Diagnosis not present

## 2017-06-16 DIAGNOSIS — D509 Iron deficiency anemia, unspecified: Secondary | ICD-10-CM | POA: Diagnosis not present

## 2017-06-16 DIAGNOSIS — N2581 Secondary hyperparathyroidism of renal origin: Secondary | ICD-10-CM | POA: Diagnosis not present

## 2017-06-16 DIAGNOSIS — Z992 Dependence on renal dialysis: Secondary | ICD-10-CM | POA: Diagnosis not present

## 2017-06-16 DIAGNOSIS — E1029 Type 1 diabetes mellitus with other diabetic kidney complication: Secondary | ICD-10-CM | POA: Diagnosis not present

## 2017-06-16 DIAGNOSIS — D631 Anemia in chronic kidney disease: Secondary | ICD-10-CM | POA: Diagnosis not present

## 2017-06-16 DIAGNOSIS — T82598A Other mechanical complication of other cardiac and vascular devices and implants, initial encounter: Secondary | ICD-10-CM | POA: Diagnosis not present

## 2017-06-16 DIAGNOSIS — M869 Osteomyelitis, unspecified: Secondary | ICD-10-CM | POA: Diagnosis not present

## 2017-06-16 DIAGNOSIS — D689 Coagulation defect, unspecified: Secondary | ICD-10-CM | POA: Diagnosis not present

## 2017-06-16 DIAGNOSIS — E890 Postprocedural hypothyroidism: Secondary | ICD-10-CM

## 2017-06-16 NOTE — Anesthesia Postprocedure Evaluation (Signed)
Anesthesia Post Note  Patient: Jack [name]  Procedure(s) Performed: INDEX FINGER RIGHT AMPUTATION (Right Finger)     Patient location during evaluation: PACU Anesthesia Type: General Level of consciousness: awake and alert Pain management: pain level controlled Vital Signs Assessment: post-procedure vital signs reviewed and stable Respiratory status: spontaneous breathing, nonlabored ventilation, respiratory function stable and patient connected to nasal cannula oxygen Cardiovascular status: blood pressure returned to baseline and stable Postop Assessment: no apparent nausea or vomiting Anesthetic complications: no    Last Vitals:  Vitals:   06/13/17 1355 06/13/17 1410  BP: 112/69 115/79  Pulse: 77 81  Resp: 12 16  Temp:  (!) 36.3 C  SpO2: 98% 100%    Last Pain:  Vitals:   06/13/17 1410  TempSrc:   PainSc: 0-No pain                 Montez Hageman

## 2017-06-18 DIAGNOSIS — D509 Iron deficiency anemia, unspecified: Secondary | ICD-10-CM | POA: Diagnosis not present

## 2017-06-18 DIAGNOSIS — E1029 Type 1 diabetes mellitus with other diabetic kidney complication: Secondary | ICD-10-CM | POA: Diagnosis not present

## 2017-06-18 DIAGNOSIS — M869 Osteomyelitis, unspecified: Secondary | ICD-10-CM | POA: Diagnosis not present

## 2017-06-18 DIAGNOSIS — D631 Anemia in chronic kidney disease: Secondary | ICD-10-CM | POA: Diagnosis not present

## 2017-06-18 DIAGNOSIS — D689 Coagulation defect, unspecified: Secondary | ICD-10-CM | POA: Diagnosis not present

## 2017-06-18 DIAGNOSIS — N2581 Secondary hyperparathyroidism of renal origin: Secondary | ICD-10-CM | POA: Diagnosis not present

## 2017-06-18 DIAGNOSIS — T82598A Other mechanical complication of other cardiac and vascular devices and implants, initial encounter: Secondary | ICD-10-CM | POA: Diagnosis not present

## 2017-06-18 DIAGNOSIS — Z992 Dependence on renal dialysis: Secondary | ICD-10-CM | POA: Diagnosis not present

## 2017-06-18 DIAGNOSIS — N186 End stage renal disease: Secondary | ICD-10-CM | POA: Diagnosis not present

## 2017-06-19 ENCOUNTER — Telehealth: Payer: Self-pay

## 2017-06-19 NOTE — Telephone Encounter (Signed)
Copied from [place] [id number]. Topic: Inquiry >> Jun 19, 2017 10:54 AM Pricilla Handler wrote: Reason for CRM: Almyra Free with [employer] [phone]) called requesting a Prior Authorization for a lift chair for the patient. Almyra Free states that the prior authorization is required in order to provide the lift chair. Please call Almyra Free at [phone].       Thank You!!!

## 2017-06-20 DIAGNOSIS — N2581 Secondary hyperparathyroidism of renal origin: Secondary | ICD-10-CM | POA: Diagnosis not present

## 2017-06-20 DIAGNOSIS — M869 Osteomyelitis, unspecified: Secondary | ICD-10-CM | POA: Diagnosis not present

## 2017-06-20 DIAGNOSIS — N186 End stage renal disease: Secondary | ICD-10-CM | POA: Diagnosis not present

## 2017-06-20 DIAGNOSIS — D509 Iron deficiency anemia, unspecified: Secondary | ICD-10-CM | POA: Diagnosis not present

## 2017-06-20 DIAGNOSIS — T82598A Other mechanical complication of other cardiac and vascular devices and implants, initial encounter: Secondary | ICD-10-CM | POA: Diagnosis not present

## 2017-06-20 DIAGNOSIS — Z992 Dependence on renal dialysis: Secondary | ICD-10-CM | POA: Diagnosis not present

## 2017-06-20 DIAGNOSIS — E1029 Type 1 diabetes mellitus with other diabetic kidney complication: Secondary | ICD-10-CM | POA: Diagnosis not present

## 2017-06-20 DIAGNOSIS — D689 Coagulation defect, unspecified: Secondary | ICD-10-CM | POA: Diagnosis not present

## 2017-06-20 DIAGNOSIS — D631 Anemia in chronic kidney disease: Secondary | ICD-10-CM | POA: Diagnosis not present

## 2017-06-22 ENCOUNTER — Other Ambulatory Visit: Payer: Self-pay | Admitting: Family Medicine

## 2017-06-22 DIAGNOSIS — E1151 Type 2 diabetes mellitus with diabetic peripheral angiopathy without gangrene: Secondary | ICD-10-CM

## 2017-06-23 DIAGNOSIS — N2581 Secondary hyperparathyroidism of renal origin: Secondary | ICD-10-CM | POA: Diagnosis not present

## 2017-06-23 DIAGNOSIS — D689 Coagulation defect, unspecified: Secondary | ICD-10-CM | POA: Diagnosis not present

## 2017-06-23 DIAGNOSIS — E1029 Type 1 diabetes mellitus with other diabetic kidney complication: Secondary | ICD-10-CM | POA: Diagnosis not present

## 2017-06-23 DIAGNOSIS — Z992 Dependence on renal dialysis: Secondary | ICD-10-CM | POA: Diagnosis not present

## 2017-06-23 DIAGNOSIS — D509 Iron deficiency anemia, unspecified: Secondary | ICD-10-CM | POA: Diagnosis not present

## 2017-06-23 DIAGNOSIS — D631 Anemia in chronic kidney disease: Secondary | ICD-10-CM | POA: Diagnosis not present

## 2017-06-23 DIAGNOSIS — M869 Osteomyelitis, unspecified: Secondary | ICD-10-CM | POA: Diagnosis not present

## 2017-06-23 DIAGNOSIS — T82598A Other mechanical complication of other cardiac and vascular devices and implants, initial encounter: Secondary | ICD-10-CM | POA: Diagnosis not present

## 2017-06-23 DIAGNOSIS — N186 End stage renal disease: Secondary | ICD-10-CM | POA: Diagnosis not present

## 2017-06-23 NOTE — Telephone Encounter (Signed)
Message sent to Dr. Jordan for review and approval. 

## 2017-06-25 DIAGNOSIS — D631 Anemia in chronic kidney disease: Secondary | ICD-10-CM | POA: Diagnosis not present

## 2017-06-25 DIAGNOSIS — T82598A Other mechanical complication of other cardiac and vascular devices and implants, initial encounter: Secondary | ICD-10-CM | POA: Diagnosis not present

## 2017-06-25 DIAGNOSIS — Z992 Dependence on renal dialysis: Secondary | ICD-10-CM | POA: Diagnosis not present

## 2017-06-25 DIAGNOSIS — M869 Osteomyelitis, unspecified: Secondary | ICD-10-CM | POA: Diagnosis not present

## 2017-06-25 DIAGNOSIS — N186 End stage renal disease: Secondary | ICD-10-CM | POA: Diagnosis not present

## 2017-06-25 DIAGNOSIS — N2581 Secondary hyperparathyroidism of renal origin: Secondary | ICD-10-CM | POA: Diagnosis not present

## 2017-06-25 DIAGNOSIS — E1029 Type 1 diabetes mellitus with other diabetic kidney complication: Secondary | ICD-10-CM | POA: Diagnosis not present

## 2017-06-25 DIAGNOSIS — D689 Coagulation defect, unspecified: Secondary | ICD-10-CM | POA: Diagnosis not present

## 2017-06-25 DIAGNOSIS — D509 Iron deficiency anemia, unspecified: Secondary | ICD-10-CM | POA: Diagnosis not present

## 2017-06-26 ENCOUNTER — Other Ambulatory Visit: Payer: Self-pay | Admitting: Family Medicine

## 2017-06-26 ENCOUNTER — Ambulatory Visit: Payer: Medicare Other | Admitting: Vascular Surgery

## 2017-06-26 DIAGNOSIS — E1151 Type 2 diabetes mellitus with diabetic peripheral angiopathy without gangrene: Secondary | ICD-10-CM

## 2017-06-29 NOTE — Telephone Encounter (Signed)
He is in a wheel chair. Who prescribed lifting chair? I do not think I did,I do not prescribe them.  BJ

## 2017-06-30 DIAGNOSIS — Z992 Dependence on renal dialysis: Secondary | ICD-10-CM | POA: Diagnosis not present

## 2017-06-30 DIAGNOSIS — D689 Coagulation defect, unspecified: Secondary | ICD-10-CM | POA: Diagnosis not present

## 2017-06-30 DIAGNOSIS — M869 Osteomyelitis, unspecified: Secondary | ICD-10-CM | POA: Diagnosis not present

## 2017-06-30 DIAGNOSIS — N2581 Secondary hyperparathyroidism of renal origin: Secondary | ICD-10-CM | POA: Diagnosis not present

## 2017-06-30 DIAGNOSIS — N186 End stage renal disease: Secondary | ICD-10-CM | POA: Diagnosis not present

## 2017-06-30 DIAGNOSIS — E1029 Type 1 diabetes mellitus with other diabetic kidney complication: Secondary | ICD-10-CM | POA: Diagnosis not present

## 2017-06-30 DIAGNOSIS — D631 Anemia in chronic kidney disease: Secondary | ICD-10-CM | POA: Diagnosis not present

## 2017-06-30 DIAGNOSIS — D509 Iron deficiency anemia, unspecified: Secondary | ICD-10-CM | POA: Diagnosis not present

## 2017-06-30 DIAGNOSIS — T82598A Other mechanical complication of other cardiac and vascular devices and implants, initial encounter: Secondary | ICD-10-CM | POA: Diagnosis not present

## 2017-07-01 NOTE — Telephone Encounter (Signed)
Spoke with [place] from [facility] and gave him instructions per Dr. Martinique that she did not prescribe lift chair. No further assistance needed at this time.

## 2017-07-02 DIAGNOSIS — D631 Anemia in chronic kidney disease: Secondary | ICD-10-CM | POA: Diagnosis not present

## 2017-07-02 DIAGNOSIS — M869 Osteomyelitis, unspecified: Secondary | ICD-10-CM | POA: Diagnosis not present

## 2017-07-02 DIAGNOSIS — N186 End stage renal disease: Secondary | ICD-10-CM | POA: Diagnosis not present

## 2017-07-02 DIAGNOSIS — Z992 Dependence on renal dialysis: Secondary | ICD-10-CM | POA: Diagnosis not present

## 2017-07-02 DIAGNOSIS — N2581 Secondary hyperparathyroidism of renal origin: Secondary | ICD-10-CM | POA: Diagnosis not present

## 2017-07-02 DIAGNOSIS — T82598A Other mechanical complication of other cardiac and vascular devices and implants, initial encounter: Secondary | ICD-10-CM | POA: Diagnosis not present

## 2017-07-02 DIAGNOSIS — E1029 Type 1 diabetes mellitus with other diabetic kidney complication: Secondary | ICD-10-CM | POA: Diagnosis not present

## 2017-07-02 DIAGNOSIS — D509 Iron deficiency anemia, unspecified: Secondary | ICD-10-CM | POA: Diagnosis not present

## 2017-07-02 DIAGNOSIS — D689 Coagulation defect, unspecified: Secondary | ICD-10-CM | POA: Diagnosis not present

## 2017-07-03 ENCOUNTER — Encounter: Payer: Self-pay | Admitting: *Deleted

## 2017-07-03 ENCOUNTER — Ambulatory Visit (INDEPENDENT_AMBULATORY_CARE_PROVIDER_SITE_OTHER): Payer: Medicare Other | Admitting: Vascular Surgery

## 2017-07-03 ENCOUNTER — Other Ambulatory Visit: Payer: Self-pay

## 2017-07-03 ENCOUNTER — Encounter: Payer: Self-pay | Admitting: Vascular Surgery

## 2017-07-03 ENCOUNTER — Other Ambulatory Visit: Payer: Self-pay | Admitting: *Deleted

## 2017-07-03 VITALS — BP 128/80 | HR 91 | Temp 99.0°F | Resp 16 | Ht 70.0 in | Wt 148.0 lb

## 2017-07-03 DIAGNOSIS — I739 Peripheral vascular disease, unspecified: Secondary | ICD-10-CM

## 2017-07-03 NOTE — Progress Notes (Signed)
  Referring Physician: Dr Kevin Kuzma Patient name: [name] MRN: [id number] DOB: 08/27/1945 Sex: male  REASON FOR CONSULT: Bilateral hand ischemia  HPI: Jack [name] is a 72 y.o. male with multiple prior hemodialysis access procedures.  He recently had a finger amputated on the left side and on the right side by Dr. Kuzma.  He has developed gangrenous changes at the stump of the amputation on the left hand.  He also has early ischemic changes of the left fourth finger.  He recently underwent an amputation of a finger on the right hand as well.  He currently has a catheter for his dialysis.  All of his prior upper extremity access sites are occluded.  Other medical problems include anemia, arthritis, diabetes.  He dialyzes Monday Wednesday and Friday.  He has previously had a right and left below-knee amputation.  Past Medical History:  Diagnosis Date  . Anemia   . Arthritis   . Asthma   . Contracture of joint    left knee  . Decubitus ulcer of sacral region, stage 2 03/07/2014  . Diabetes mellitus with peripheral vascular disease (HCC)   . End-stage renal disease on hemodialysis (HCC)    Started HD March 2014.  Cause of ESRD was DM.  Gets HD at [facility] on [address] on MWF schedule.   . ESRD on hemodialysis (HCC)   . Gangrene (HCC)    right BKA  . Gangrene (HCC)    right index finger  . Gangrene of foot (HCC)   . Glaucoma   . Glaucoma 03/07/2014  . Heart murmur   . History of MRSA infection 04/22/2013   Bacteremia assoc w L foot wound infection Mar 2015   . Hyperparathyroidism, secondary renal (HCC)   . Hypertension   . Hypothyroidism   . MRSA bacteremia   . Multiple myeloma, without mention of having achieved remission 03/30/2012   Cytogenetic neg on 03/23/2012.  . Necrosis (HCC)    left long finger  . Peripheral arterial disease (HCC)   . Peripheral vascular disease, unspecified (HCC) 11/19/2012   In the past had R foot toe amps then R TMA. In 2015 had  left foot toe amp > then TMA >then L BKA on 05/14/13   . Pneumonia    2012  . PONV (postoperative nausea and vomiting)   . Thyroid disease    hyperparathyroidism   Past Surgical History:  Procedure Laterality Date  . ABDOMINAL AORTAGRAM Bilateral 11/06/2012   Procedure: ABDOMINAL AORTAGRAM;  Surgeon: Mayrani Khamis E Monika Chestang, MD;  [facility];  Service: Cardiovascular;  Laterality: Bilateral;  . AMPUTATION Right 11/10/2012   Procedure: AMPUTATION FIRST and SECOND TOES Right Foot;  Surgeon: Darril Patriarca E Saivon Prowse, MD;  [facility];  Service: Vascular;  Laterality: Right;  . AMPUTATION Left 04/07/2013   Procedure: AMPUTATION DIGIT- LEFT 1ST TOE;  Surgeon: James D Lawson, MD;  [facility];  Service: Vascular;  Laterality: Left;  . AMPUTATION Left 04/26/2013   Procedure: Left Foot Transmetatarsal Amputation;  Surgeon: Marcus V [name], MD;  [facility];  Service: Orthopedics;  Laterality: Left;  . AMPUTATION Left 05/14/2013   Procedure: AMPUTATION BELOW KNEE;  Surgeon: Marcus V [name], MD;  [facility];  Service: Orthopedics;  Laterality: Left;  Left Below Knee Amputation  . AMPUTATION Left 07/23/2013   Procedure: AMPUTATION BELOW KNEE;  Surgeon: Marcus [name] V, MD;  [facility];  Service: Orthopedics;  Laterality: Left;  Left Below Knee Amputation   Revision  . AMPUTATION Right 02/18/2014   Procedure: AMPUTATION BELOW KNEE;  Surgeon: Marcus [name] V, MD;  [facility];  Service: Orthopedics;  Laterality: Right;  . AMPUTATION Right 08/20/2016   Procedure: RIGHT LONG FINGER AMPUTATION;  Surgeon: Kuzma, Kevin, MD;  Location: [facility];  Service: Orthopedics;  Laterality: Right;  . AMPUTATION Right 01/11/2017   Procedure: RIGHT LONG FINGER AMPUTATION;  Surgeon: Kuzma, Kevin, MD;  [facility];  Service: Orthopedics;  Laterality: Right;  . AMPUTATION Left 04/04/2017   Procedure: AMPUTATION DIGIT LEFT LONG FINGER;  Surgeon: Kuzma, Kevin, MD;  [facility];  Service: Orthopedics;   Laterality: Left;  . AMPUTATION Right 06/13/2017   Procedure: INDEX FINGER RIGHT AMPUTATION;  Surgeon: Kuzma, Kevin, MD;  [facility];  Service: Orthopedics;  Laterality: Right;  . AV FISTULA PLACEMENT Left 03/25/2012   Procedure: ARTERIOVENOUS (AV) FISTULA CREATION;  Surgeon: James D Lawson, MD;  [facility];  Service: Vascular;  Laterality: Left;  . BELOW KNEE LEG AMPUTATION Left 05/14/2013   DR [name]  . BELOW KNEE LEG AMPUTATION Right 02/18/2014   dr duda  . CARDIAC CATHETERIZATION     approx 30 years ago  . CERVICAL DISC SURGERY    . EYE SURGERY Bilateral    CATARACTS  . I&D EXTREMITY Left 04/22/2013   Procedure: IRRIGATION AND DEBRIDEMENT LEFT FIRST TOE AMPUTATION WOUND ;  Surgeon: James D Lawson, MD;  [facility];  Service: Vascular;  Laterality: Left;  . I&D EXTREMITY Right 01/05/2014   Procedure: IRRIGATION AND DEBRIDEMENT Right Heel Ulcer;  Surgeon: Christopher Y Blackman, MD;  [facility];  Service: Orthopedics;  Laterality: Right;  Surgeon Available after 5PM  . INSERTION OF DIALYSIS CATHETER Right 03/19/2012   Procedure: INSERTION OF DIALYSIS CATHETER;  Surgeon: James D Lawson, MD;  [facility];  Service: Vascular;  Laterality: Right;  Right Internal Jugular  . LIGATION OF COMPETING BRANCHES OF ARTERIOVENOUS FISTULA Left 05/08/2012   Procedure: LIGATION OF COMPETING BRANCHES OF ARTERIOVENOUS FISTULA;  Surgeon: James D Lawson, MD;  [facility];  Service: Vascular;  Laterality: Left;  Ultrasound guided  . REPAIR QUADRICEPS/HAMSTRING MUSCLES Left 05/20/2014   Procedure: Left Hamstring Release;  Surgeon: Marcus [name] V, MD;  [facility];  Service: Orthopedics;  Laterality: Left;  . REVISON OF ARTERIOVENOUS FISTULA Left 01/30/2016   Procedure: CEPHALIC VEIN TURNDOWN TO LEFT UPPER ARM;  Surgeon: Joshaua Epple E Kahle Mcqueen, MD;  [facility];  Service: Vascular;  Laterality: Left;  . STUMP REVISION Right 05/20/2014   Procedure: Revision Right Below Knee Amputation;  Surgeon:  Marcus [name] V, MD;  [facility];  Service: Orthopedics;  Laterality: Right;  . TEE WITHOUT CARDIOVERSION N/A 04/20/2013   Procedure: TRANSESOPHAGEAL ECHOCARDIOGRAM (TEE);  Surgeon: Peter C Nishan, MD;  [facility];  Service: Cardiovascular;  Laterality: N/A;  . THYROIDECTOMY    . TOE AMPUTATION     D/C 04-30-13  . TRANSMETATARSAL AMPUTATION Left 12/16/2012   Procedure: TRANSMETATARSAL AMPUTATION AND VAC PLACEMENT;  Surgeon: Isaiah Torok E Magdalina Whitehead, MD;  [facility];  Service: Vascular;  Laterality: Left;    Family History  Problem Relation Age of Onset  . Diabetes Mother   . Cancer Mother        bone   . Kidney disease Father     SOCIAL HISTORY: Social History   Socioeconomic History  . Marital status: Divorced    Spouse name: Not on file  . Number of children: Not   on file  . Years of education: Not on file  . Highest education level: Not on file  Occupational History  . Not on file  Social Needs  . Financial resource strain: Not on file  . Food insecurity:    Worry: Not on file    Inability: Not on file  . Transportation needs:    Medical: Not on file    Non-medical: Not on file  Tobacco Use  . Smoking status: Former Smoker    Types: Cigarettes    Last attempt to quit: 03/19/1982    Years since quitting: 35.3  . Smokeless tobacco: Never Used  Substance and Sexual Activity  . Alcohol use: No    Alcohol/week: 0.0 oz    Comment: rare  . Drug use: No  . Sexual activity: Never  Lifestyle  . Physical activity:    Days per week: Not on file    Minutes per session: Not on file  . Stress: Not on file  Relationships  . Social connections:    Talks on phone: Not on file    Gets together: Not on file    Attends religious service: Not on file    Active member of club or organization: Not on file    Attends meetings of clubs or organizations: Not on file    Relationship status: Not on file  . Intimate partner violence:    Fear of current or ex partner: Not on  file    Emotionally abused: Not on file    Physically abused: Not on file    Forced sexual activity: Not on file  Other Topics Concern  . Not on file  Social History Narrative  . Not on file    Allergies  Allergen Reactions  . Bee Venom Anaphylaxis  . Ivp Dye [Iodinated Diagnostic Agents] Hives  . Lisinopril Cough  . Morphine And Related Hives and Other (See Comments)    Bradycardia     Current Outpatient Medications  Medication Sig Dispense Refill  . aspirin EC 81 MG tablet Take 81 mg by mouth daily.    . atorvastatin (LIPITOR) 10 MG tablet TAKE 1 TABLET (10 MG TOTAL) BY MOUTH DAILY. 90 tablet 1  . Continuous Blood Gluc Sensor (FREESTYLE LIBRE 14 DAY SENSOR) MISC USE DAILY*NOT COVERED* 1 each 1  . lanthanum (FOSRENOL) 1000 MG chewable tablet Chew 1,000 mg by mouth daily.   6  . levothyroxine (SYNTHROID, LEVOTHROID) 150 MCG tablet TAKE 1/2 TABLET BY MOUTH ON MONDAYS AND 1 TABLET BY MOUTH THE REST OF THE WEEK. 90 tablet 1  . multivitamin (RENA-VIT) TABS tablet Take 1 tablet by mouth 4 (four) times a week.     . naproxen sodium (ALEVE) 220 MG tablet Take 220 mg by mouth daily as needed (pain).    . oxyCODONE-acetaminophen (PERCOCET) 5-325 MG tablet 1-2 tabs po q6 hours prn pain 20 tablet 0   No current facility-administered medications for this visit.     ROS:   General:  No weight loss, Fever, chills  HEENT: No recent headaches, no nasal bleeding, no visual changes, no sore throat    Physical Examination  Vitals:   07/03/17 1504  BP: 128/80  Pulse: 91  Resp: 16  Temp: 99 F (37.2 C)  TempSrc: Oral  Weight: 148 lb (67.1 kg)  Height: 5' 10" (1.778 m)    Body mass index is 21.24 kg/m.  General:  Alert and oriented, no acute distress HEENT: Normal Neck: No bruit or JVD Pulmonary:   Clear to auscultation bilaterally Cardiac: Regular Rate and Rhythm without murmur Abdomen: Soft, non-tender, non-distended, no mass, no scars Skin: No rash Extremity Pulses:  2+  radial, brachial right side absent left radial 2+ left brachial no audible bruit or thrill in any upper extremity access, 2+ femoral pulses bilaterally Musculoskeletal: See pictures below Neurologic: Upper and lower extremity motor 5/5 and symmetric        ASSESSMENT: Bilateral upper extremity ischemia.  All of his hemodialysis access is occluded so I do not believe he has a steal syndrome this most likely represents occlusive disease.   PLAN: Bilateral upper extremity arteriogram to see if there is anything we can do to improve overall flow to his upper extremities.  We will move his dialysis today and schedule this for July 11, 2017.  Risk benefits possible complications and procedure details were discussed with the patient did include but not limited to bleeding infection stroke risk he understands and agrees to proceed  Chalet Kerwin, MD [facility] [place] Office: [phone] Pager: [phone]   

## 2017-07-03 NOTE — H&P (View-Only) (Signed)
Referring Physician: Dr Leanora Cover Patient name: Jack [name] MRN: [id number] DOB: [birth year] Sex: male  REASON FOR CONSULT: Bilateral hand ischemia  HPI: Jack [name] is a 72 y.o. male with multiple prior hemodialysis access procedures.  He recently had a finger amputated on the left side and on the right side by Dr. Fredna Dow.  He has developed gangrenous changes at the stump of the amputation on the left hand.  He also has early ischemic changes of the left fourth finger.  He recently underwent an amputation of a finger on the right hand as well.  He currently has a catheter for his dialysis.  All of his prior upper extremity access sites are occluded.  Other medical problems include anemia, arthritis, diabetes.  He dialyzes Monday Wednesday and Friday.  He has previously had a right and left below-knee amputation.  Past Medical History:  Diagnosis Date  . Anemia   . Arthritis   . Asthma   . Contracture of joint    left knee  . Decubitus ulcer of sacral region, stage 2 03/07/2014  . Diabetes mellitus with peripheral vascular disease ([place])   . End-stage renal disease on hemodialysis [facility])    Started HD March 2014.  Cause of ESRD was DM.  Gets HD at [facility] on [address] on MWF schedule.   Marland Kitchen ESRD on hemodialysis ([place])   . Gangrene ([place])    right BKA  . Gangrene ([place])    right index finger  . Gangrene of foot ([place])   . Glaucoma   . Glaucoma 03/07/2014  . Heart murmur   . History of MRSA infection 04/22/2013   Bacteremia assoc w L foot wound infection Mar 2015   . Hyperparathyroidism, secondary renal ([place])   . Hypertension   . Hypothyroidism   . MRSA bacteremia   . Multiple myeloma, without mention of having achieved remission 03/30/2012   Cytogenetic neg on 03/23/2012.  Marland Kitchen Necrosis ([place])    left long finger  . Peripheral arterial disease ([place])   . Peripheral vascular disease, unspecified ([place]) 11/19/2012   In the past had R foot toe amps then R TMA. In 2015 had  left foot toe amp > then TMA >then L BKA on 05/14/13   . Pneumonia    2012  . PONV (postoperative nausea and vomiting)   . Thyroid disease    hyperparathyroidism   Past Surgical History:  Procedure Laterality Date  . ABDOMINAL AORTAGRAM Bilateral 11/06/2012   Procedure: ABDOMINAL AORTAGRAM;  Surgeon: Elam Dutch, MD;  Location: [facility] CATH LAB;  Service: Cardiovascular;  Laterality: Bilateral;  . AMPUTATION Right 11/10/2012   Procedure: AMPUTATION FIRST and SECOND TOES Right Foot;  Surgeon: Elam Dutch, MD;  Location: [place];  Service: Vascular;  Laterality: Right;  . AMPUTATION Left 04/07/2013   Procedure: AMPUTATION DIGIT- LEFT 1ST TOE;  Surgeon: Mal Misty, MD;  Location: [place];  Service: Vascular;  Laterality: Left;  . AMPUTATION Left 04/26/2013   Procedure: Left Foot Transmetatarsal Amputation;  Surgeon: Newt Minion, MD;  Location: [place];  Service: Orthopedics;  Laterality: Left;  . AMPUTATION Left 05/14/2013   Procedure: AMPUTATION BELOW KNEE;  Surgeon: Newt Minion, MD;  Location: [place];  Service: Orthopedics;  Laterality: Left;  Left Below Knee Amputation  . AMPUTATION Left 07/23/2013   Procedure: AMPUTATION BELOW KNEE;  Surgeon: Newt Minion, MD;  Location: [place];  Service: Orthopedics;  Laterality: Left;  Left Below Knee Amputation  Revision  . AMPUTATION Right 02/18/2014   Procedure: AMPUTATION BELOW KNEE;  Surgeon: Newt Minion, MD;  Location: [place];  Service: Orthopedics;  Laterality: Right;  . AMPUTATION Right 08/20/2016   Procedure: RIGHT LONG FINGER AMPUTATION;  Surgeon: Leanora Cover, MD;  Location: [place];  Service: Orthopedics;  Laterality: Right;  . AMPUTATION Right 01/11/2017   Procedure: RIGHT LONG FINGER AMPUTATION;  Surgeon: Leanora Cover, MD;  Location: [place];  Service: Orthopedics;  Laterality: Right;  . AMPUTATION Left 04/04/2017   Procedure: AMPUTATION DIGIT LEFT LONG FINGER;  Surgeon: Leanora Cover, MD;  Location: [place];  Service: Orthopedics;   Laterality: Left;  . AMPUTATION Right 06/13/2017   Procedure: INDEX FINGER RIGHT AMPUTATION;  Surgeon: Leanora Cover, MD;  Location: [place];  Service: Orthopedics;  Laterality: Right;  . AV FISTULA PLACEMENT Left 03/25/2012   Procedure: ARTERIOVENOUS (AV) FISTULA CREATION;  Surgeon: Mal Misty, MD;  Location: [place];  Service: Vascular;  Laterality: Left;  . BELOW KNEE LEG AMPUTATION Left 05/14/2013   DR [name]  . BELOW KNEE LEG AMPUTATION Right 02/18/2014   dr duda  . CARDIAC CATHETERIZATION     approx 30 years ago  . CERVICAL DISC SURGERY    . EYE SURGERY Bilateral    CATARACTS  . I&D EXTREMITY Left 04/22/2013   Procedure: IRRIGATION AND DEBRIDEMENT LEFT FIRST TOE AMPUTATION WOUND ;  Surgeon: Mal Misty, MD;  Location: [place];  Service: Vascular;  Laterality: Left;  . I&D EXTREMITY Right 01/05/2014   Procedure: IRRIGATION AND DEBRIDEMENT Right Heel Ulcer;  Surgeon: Mcarthur Rossetti, MD;  Location: [place];  Service: Orthopedics;  Laterality: Right;  Surgeon Available after 5PM  . INSERTION OF DIALYSIS CATHETER Right 03/19/2012   Procedure: INSERTION OF DIALYSIS CATHETER;  Surgeon: Mal Misty, MD;  Location: [place];  Service: Vascular;  Laterality: Right;  Right Internal Jugular  . LIGATION OF COMPETING BRANCHES OF ARTERIOVENOUS FISTULA Left 05/08/2012   Procedure: LIGATION OF COMPETING BRANCHES OF ARTERIOVENOUS FISTULA;  Surgeon: Mal Misty, MD;  Location: [place];  Service: Vascular;  Laterality: Left;  Ultrasound guided  . REPAIR QUADRICEPS/HAMSTRING MUSCLES Left 05/20/2014   Procedure: Left Hamstring Release;  Surgeon: Newt Minion, MD;  Location: [place];  Service: Orthopedics;  Laterality: Left;  . REVISON OF ARTERIOVENOUS FISTULA Left 01/30/2016   Procedure: CEPHALIC VEIN TURNDOWN TO LEFT UPPER ARM;  Surgeon: Elam Dutch, MD;  Location: [place];  Service: Vascular;  Laterality: Left;  . STUMP REVISION Right 05/20/2014   Procedure: Revision Right Below Knee Amputation;  Surgeon:  Newt Minion, MD;  Location: [place];  Service: Orthopedics;  Laterality: Right;  . TEE WITHOUT CARDIOVERSION N/A 04/20/2013   Procedure: TRANSESOPHAGEAL ECHOCARDIOGRAM (TEE);  Surgeon: Josue Hector, MD;  Location: [facility] ENDOSCOPY;  Service: Cardiovascular;  Laterality: N/A;  . THYROIDECTOMY    . TOE AMPUTATION     D/C 04-30-13  . TRANSMETATARSAL AMPUTATION Left 12/16/2012   Procedure: TRANSMETATARSAL AMPUTATION AND VAC PLACEMENT;  Surgeon: Elam Dutch, MD;  Location: [facility] OR;  Service: Vascular;  Laterality: Left;    Family History  Problem Relation Age of Onset  . Diabetes Mother   . Cancer Mother        bone   . Kidney disease Father     SOCIAL HISTORY: Social History   Socioeconomic History  . Marital status: Divorced    Spouse name: Not on file  . Number of children: Not  on file  . Years of education: Not on file  . Highest education level: Not on file  Occupational History  . Not on file  Social Needs  . Financial resource strain: Not on file  . Food insecurity:    Worry: Not on file    Inability: Not on file  . Transportation needs:    Medical: Not on file    Non-medical: Not on file  Tobacco Use  . Smoking status: Former Smoker    Types: Cigarettes    Last attempt to quit: 03/19/1982    Years since quitting: 35.3  . Smokeless tobacco: Never Used  Substance and Sexual Activity  . Alcohol use: No    Alcohol/week: 0.0 oz    Comment: rare  . Drug use: No  . Sexual activity: Never  Lifestyle  . Physical activity:    Days per week: Not on file    Minutes per session: Not on file  . Stress: Not on file  Relationships  . Social connections:    Talks on phone: Not on file    Gets together: Not on file    Attends religious service: Not on file    Active member of club or organization: Not on file    Attends meetings of clubs or organizations: Not on file    Relationship status: Not on file  . Intimate partner violence:    Fear of current or ex partner: Not on  file    Emotionally abused: Not on file    Physically abused: Not on file    Forced sexual activity: Not on file  Other Topics Concern  . Not on file  Social History Narrative  . Not on file    Allergies  Allergen Reactions  . Bee Venom Anaphylaxis  . Ivp Dye [Iodinated Diagnostic Agents] Hives  . Lisinopril Cough  . Morphine And Related Hives and Other (See Comments)    Bradycardia     Current Outpatient Medications  Medication Sig Dispense Refill  . aspirin EC 81 MG tablet Take 81 mg by mouth daily.    Marland Kitchen atorvastatin (LIPITOR) 10 MG tablet TAKE 1 TABLET (10 MG TOTAL) BY MOUTH DAILY. 90 tablet 1  . Continuous Blood Gluc Sensor (FREESTYLE LIBRE 14 DAY SENSOR) MISC USE DAILY*NOT COVERED* 1 each 1  . lanthanum (FOSRENOL) 1000 MG chewable tablet Chew 1,000 mg by mouth daily.   6  . levothyroxine (SYNTHROID, LEVOTHROID) 150 MCG tablet TAKE 1/2 TABLET BY MOUTH ON MONDAYS AND 1 TABLET BY MOUTH THE REST OF THE WEEK. 90 tablet 1  . multivitamin (RENA-VIT) TABS tablet Take 1 tablet by mouth 4 (four) times a week.     . naproxen sodium (ALEVE) 220 MG tablet Take 220 mg by mouth daily as needed (pain).    Marland Kitchen oxyCODONE-acetaminophen (PERCOCET) 5-325 MG tablet 1-2 tabs po q6 hours prn pain 20 tablet 0   No current facility-administered medications for this visit.     ROS:   General:  No weight loss, Fever, chills  HEENT: No recent headaches, no nasal bleeding, no visual changes, no sore throat    Physical Examination  Vitals:   07/03/17 1504  BP: 128/80  Pulse: 91  Resp: 16  Temp: 99 F (37.2 C)  TempSrc: Oral  Weight: 148 lb (67.1 kg)  Height: _0  (1.778 m)    Body mass index is 21.24 kg/m.  General:  Alert and oriented, no acute distress HEENT: Normal Neck: No bruit or JVD Pulmonary:  Clear to auscultation bilaterally Cardiac: Regular Rate and Rhythm without murmur Abdomen: Soft, non-tender, non-distended, no mass, no scars Skin: No rash Extremity Pulses:  2+  radial, brachial right side absent left radial 2+ left brachial no audible bruit or thrill in any upper extremity access, 2+ femoral pulses bilaterally Musculoskeletal: See pictures below Neurologic: Upper and lower extremity motor 5/5 and symmetric        ASSESSMENT: Bilateral upper extremity ischemia.  All of his hemodialysis access is occluded so I do not believe he has a steal syndrome this most likely represents occlusive disease.   PLAN: Bilateral upper extremity arteriogram to see if there is anything we can do to improve overall flow to his upper extremities.  We will move his dialysis today and schedule this for July 11, 2017.  Risk benefits possible complications and procedure details were discussed with the patient did include but not limited to bleeding infection stroke risk he understands and agrees to proceed  [name], MD [facility] [place]: [phone] Pager: [phone]

## 2017-07-04 DIAGNOSIS — M869 Osteomyelitis, unspecified: Secondary | ICD-10-CM | POA: Diagnosis not present

## 2017-07-04 DIAGNOSIS — Z992 Dependence on renal dialysis: Secondary | ICD-10-CM | POA: Diagnosis not present

## 2017-07-04 DIAGNOSIS — D689 Coagulation defect, unspecified: Secondary | ICD-10-CM | POA: Diagnosis not present

## 2017-07-04 DIAGNOSIS — N2581 Secondary hyperparathyroidism of renal origin: Secondary | ICD-10-CM | POA: Diagnosis not present

## 2017-07-04 DIAGNOSIS — T82598A Other mechanical complication of other cardiac and vascular devices and implants, initial encounter: Secondary | ICD-10-CM | POA: Diagnosis not present

## 2017-07-04 DIAGNOSIS — E1029 Type 1 diabetes mellitus with other diabetic kidney complication: Secondary | ICD-10-CM | POA: Diagnosis not present

## 2017-07-04 DIAGNOSIS — D631 Anemia in chronic kidney disease: Secondary | ICD-10-CM | POA: Diagnosis not present

## 2017-07-04 DIAGNOSIS — N186 End stage renal disease: Secondary | ICD-10-CM | POA: Diagnosis not present

## 2017-07-04 DIAGNOSIS — D509 Iron deficiency anemia, unspecified: Secondary | ICD-10-CM | POA: Diagnosis not present

## 2017-07-07 ENCOUNTER — Telehealth: Payer: Self-pay | Admitting: *Deleted

## 2017-07-07 DIAGNOSIS — D689 Coagulation defect, unspecified: Secondary | ICD-10-CM | POA: Diagnosis not present

## 2017-07-07 DIAGNOSIS — D631 Anemia in chronic kidney disease: Secondary | ICD-10-CM | POA: Diagnosis not present

## 2017-07-07 DIAGNOSIS — T82598A Other mechanical complication of other cardiac and vascular devices and implants, initial encounter: Secondary | ICD-10-CM | POA: Diagnosis not present

## 2017-07-07 DIAGNOSIS — M869 Osteomyelitis, unspecified: Secondary | ICD-10-CM | POA: Diagnosis not present

## 2017-07-07 DIAGNOSIS — D509 Iron deficiency anemia, unspecified: Secondary | ICD-10-CM | POA: Diagnosis not present

## 2017-07-07 DIAGNOSIS — N2581 Secondary hyperparathyroidism of renal origin: Secondary | ICD-10-CM | POA: Diagnosis not present

## 2017-07-07 DIAGNOSIS — N186 End stage renal disease: Secondary | ICD-10-CM | POA: Diagnosis not present

## 2017-07-07 DIAGNOSIS — E1029 Type 1 diabetes mellitus with other diabetic kidney complication: Secondary | ICD-10-CM | POA: Diagnosis not present

## 2017-07-07 DIAGNOSIS — Z992 Dependence on renal dialysis: Secondary | ICD-10-CM | POA: Diagnosis not present

## 2017-07-07 NOTE — Telephone Encounter (Signed)
Spoke to [place] [facility] at [place] to switch HD days to accommodate procedure with Dr. Oneida Alar on Friday 07/11/17. To arrive at [facility] at 8 am on procedure day. Will arrange with patient.

## 2017-07-07 NOTE — Progress Notes (Deleted)
HPI:   [name] is a 72 y.o. male, who is here today for 3 months follow up.   *** was last seen on ***  Diabetes Mellitus II:    Currently on non pharmacologic treatment. Glipizide was discontinue last OV.  Checking BS's : *** Hypoglycemia:   *** denies abdominal pain, nausea, vomiting, polydipsia, polyuria, or polyphagia.  PAD, s/p bilateral LE amputation. ESRD on hemodialysis.      Lab Results  Component Value Date   CREATININE 6.31 (H) 04/04/2017   BUN 29 (H) 04/04/2017   NA 144 06/13/2017   K 3.4 (L) 06/13/2017   CL 99 (L) 04/04/2017   CO2 24 04/04/2017    Lab Results  Component Value Date   HGBA1C 6.1 04/01/2017   No results found for: MICROALBUR, [id number]    {4+ HPI elements (or status of 3 or more chronic diseases)} ***      Review of Systems  [review of 2 to 9 systems] ***  Current Outpatient Medications on File Prior to Visit  Medication Sig Dispense Refill  . aspirin EC 81 MG tablet Take 81 mg by mouth daily.    Marland Kitchen atorvastatin (LIPITOR) 10 MG tablet TAKE 1 TABLET (10 MG TOTAL) BY MOUTH DAILY. 90 tablet 1  . Continuous Blood Gluc Sensor (FREESTYLE LIBRE 14 DAY SENSOR) MISC USE DAILY*NOT COVERED* 1 each 1  . lanthanum (FOSRENOL) 1000 MG chewable tablet Chew 1,000 mg by mouth daily.   6  . levothyroxine (SYNTHROID, LEVOTHROID) 150 MCG tablet TAKE 1/2 TABLET BY MOUTH ON MONDAYS AND 1 TABLET BY MOUTH THE REST OF THE WEEK. 90 tablet 1  . multivitamin (RENA-VIT) TABS tablet Take 1 tablet by mouth 4 (four) times a week.     . naproxen sodium (ALEVE) 220 MG tablet Take 220 mg by mouth daily as needed (pain).    Marland Kitchen oxyCODONE-acetaminophen (PERCOCET) 5-325 MG tablet 1-2 tabs po q6 hours prn pain 20 tablet 0   No current facility-administered medications on file prior to visit.      Past Medical History:  Diagnosis Date  . Anemia   . Arthritis   . Asthma   . Contracture of joint    left knee  . Decubitus ulcer of  sacral region, stage 2 03/07/2014  . Diabetes mellitus with peripheral vascular disease ([place])   . End-stage renal disease on hemodialysis [facility])    Started HD March 2014.  Cause of ESRD was DM.  Gets HD at [facility] on [address] on MWF schedule.   Marland Kitchen ESRD on hemodialysis ([place])   . Gangrene ([place])    right BKA  . Gangrene ([place])    right index finger  . Gangrene of foot ([place])   . Glaucoma   . Glaucoma 03/07/2014  . Heart murmur   . History of MRSA infection 04/22/2013   Bacteremia assoc w L foot wound infection Mar 2015   . Hyperparathyroidism, secondary renal ([place])   . Hypertension   . Hypothyroidism   . MRSA bacteremia   . Multiple myeloma, without mention of having achieved remission 03/30/2012   Cytogenetic neg on 03/23/2012.  Marland Kitchen Necrosis ([place])    left long finger  . Peripheral arterial disease ([place])   . Peripheral vascular disease, unspecified ([place]) 11/19/2012   In the past had R foot toe amps then R TMA. In 2015 had left foot toe amp > then TMA >then L BKA on 05/14/13   . Pneumonia  2012  . PONV (postoperative nausea and vomiting)   . Thyroid disease    hyperparathyroidism   Allergies  Allergen Reactions  . Bee Venom Anaphylaxis  . Ivp Dye [Iodinated Diagnostic Agents] Hives  . Lisinopril Cough  . Morphine And Related Hives and Other (See Comments)    Bradycardia     Social History   Socioeconomic History  . Marital status: Divorced    Spouse name: Not on file  . Number of children: Not on file  . Years of education: Not on file  . Highest education level: Not on file  Occupational History  . Not on file  Social Needs  . Financial resource strain: Not on file  . Food insecurity:    Worry: Not on file    Inability: Not on file  . Transportation needs:    Medical: Not on file    Non-medical: Not on file  Tobacco Use  . Smoking status: Former Smoker    Types: Cigarettes    Last attempt to quit: 03/19/1982    Years since quitting: 35.3  . Smokeless tobacco: Never  Used  Substance and Sexual Activity  . Alcohol use: No    Alcohol/week: 0.0 oz    Comment: rare  . Drug use: No  . Sexual activity: Never  Lifestyle  . Physical activity:    Days per week: Not on file    Minutes per session: Not on file  . Stress: Not on file  Relationships  . Social connections:    Talks on phone: Not on file    Gets together: Not on file    Attends religious service: Not on file    Active member of club or organization: Not on file    Attends meetings of clubs or organizations: Not on file    Relationship status: Not on file  Other Topics Concern  . Not on file  Social History Narrative  . Not on file    There were no vitals filed for this visit. There is no height or weight on file to calculate BMI.      Physical Exam  {[12+ exam elements]} ***  ASSESSMENT AND PLAN:   [name] was seen today for *** months follow-up.  No orders of the defined types were placed in this encounter.   No problem-specific Assessment & Plan notes found for this encounter.           -[name] was advised to return sooner than planned today if new concerns arise.       Amit Leece G. [place], MD  [facility]. [place] office.

## 2017-07-08 ENCOUNTER — Ambulatory Visit: Payer: Medicare Other | Admitting: Family Medicine

## 2017-07-09 DIAGNOSIS — D509 Iron deficiency anemia, unspecified: Secondary | ICD-10-CM | POA: Diagnosis not present

## 2017-07-09 DIAGNOSIS — N2581 Secondary hyperparathyroidism of renal origin: Secondary | ICD-10-CM | POA: Diagnosis not present

## 2017-07-09 DIAGNOSIS — M869 Osteomyelitis, unspecified: Secondary | ICD-10-CM | POA: Diagnosis not present

## 2017-07-09 DIAGNOSIS — N186 End stage renal disease: Secondary | ICD-10-CM | POA: Diagnosis not present

## 2017-07-09 DIAGNOSIS — Z992 Dependence on renal dialysis: Secondary | ICD-10-CM | POA: Diagnosis not present

## 2017-07-09 DIAGNOSIS — D631 Anemia in chronic kidney disease: Secondary | ICD-10-CM | POA: Diagnosis not present

## 2017-07-09 DIAGNOSIS — D689 Coagulation defect, unspecified: Secondary | ICD-10-CM | POA: Diagnosis not present

## 2017-07-09 DIAGNOSIS — E1029 Type 1 diabetes mellitus with other diabetic kidney complication: Secondary | ICD-10-CM | POA: Diagnosis not present

## 2017-07-09 DIAGNOSIS — T82598A Other mechanical complication of other cardiac and vascular devices and implants, initial encounter: Secondary | ICD-10-CM | POA: Diagnosis not present

## 2017-07-10 ENCOUNTER — Other Ambulatory Visit: Payer: Self-pay | Admitting: Family Medicine

## 2017-07-10 DIAGNOSIS — E1151 Type 2 diabetes mellitus with diabetic peripheral angiopathy without gangrene: Secondary | ICD-10-CM

## 2017-07-11 ENCOUNTER — Other Ambulatory Visit: Payer: Self-pay | Admitting: Family Medicine

## 2017-07-11 ENCOUNTER — Ambulatory Visit (HOSPITAL_COMMUNITY)
Admission: RE | Admit: 2017-07-11 | Discharge: 2017-07-11 | Disposition: A | Discharge status: Home / Self Care | Payer: Medicare Other | Source: Ambulatory Visit | Attending: Vascular Surgery | Admitting: Vascular Surgery

## 2017-07-11 ENCOUNTER — Telehealth: Payer: Self-pay | Admitting: Vascular Surgery

## 2017-07-11 ENCOUNTER — Ambulatory Visit (HOSPITAL_COMMUNITY)
Admission: RE | Disposition: A | Discharge status: Home / Self Care | Payer: Self-pay | Source: Ambulatory Visit | Attending: Vascular Surgery

## 2017-07-11 DIAGNOSIS — Z8614 Personal history of Methicillin resistant Staphylococcus aureus infection: Secondary | ICD-10-CM | POA: Insufficient documentation

## 2017-07-11 DIAGNOSIS — Z89512 Acquired absence of left leg below knee: Secondary | ICD-10-CM | POA: Diagnosis not present

## 2017-07-11 DIAGNOSIS — E1152 Type 2 diabetes mellitus with diabetic peripheral angiopathy with gangrene: Secondary | ICD-10-CM | POA: Insufficient documentation

## 2017-07-11 DIAGNOSIS — Z87891 Personal history of nicotine dependence: Secondary | ICD-10-CM | POA: Insufficient documentation

## 2017-07-11 DIAGNOSIS — E1122 Type 2 diabetes mellitus with diabetic chronic kidney disease: Secondary | ICD-10-CM | POA: Insufficient documentation

## 2017-07-11 DIAGNOSIS — N2581 Secondary hyperparathyroidism of renal origin: Secondary | ICD-10-CM | POA: Insufficient documentation

## 2017-07-11 DIAGNOSIS — H409 Unspecified glaucoma: Secondary | ICD-10-CM | POA: Diagnosis not present

## 2017-07-11 DIAGNOSIS — Z885 Allergy status to narcotic agent status: Secondary | ICD-10-CM | POA: Insufficient documentation

## 2017-07-11 DIAGNOSIS — Z992 Dependence on renal dialysis: Secondary | ICD-10-CM | POA: Insufficient documentation

## 2017-07-11 DIAGNOSIS — E039 Hypothyroidism, unspecified: Secondary | ICD-10-CM | POA: Insufficient documentation

## 2017-07-11 DIAGNOSIS — Z7982 Long term (current) use of aspirin: Secondary | ICD-10-CM | POA: Diagnosis not present

## 2017-07-11 DIAGNOSIS — I96 Gangrene, not elsewhere classified: Secondary | ICD-10-CM | POA: Diagnosis not present

## 2017-07-11 DIAGNOSIS — M1990 Unspecified osteoarthritis, unspecified site: Secondary | ICD-10-CM | POA: Diagnosis not present

## 2017-07-11 DIAGNOSIS — J45909 Unspecified asthma, uncomplicated: Secondary | ICD-10-CM | POA: Insufficient documentation

## 2017-07-11 DIAGNOSIS — I120 Hypertensive chronic kidney disease with stage 5 chronic kidney disease or end stage renal disease: Secondary | ICD-10-CM | POA: Insufficient documentation

## 2017-07-11 DIAGNOSIS — N186 End stage renal disease: Secondary | ICD-10-CM | POA: Insufficient documentation

## 2017-07-11 DIAGNOSIS — Z89511 Acquired absence of right leg below knee: Secondary | ICD-10-CM | POA: Insufficient documentation

## 2017-07-11 DIAGNOSIS — E1151 Type 2 diabetes mellitus with diabetic peripheral angiopathy without gangrene: Secondary | ICD-10-CM

## 2017-07-11 HISTORY — PX: UPPER EXTREMITY ANGIOGRAPHY: CATH118270

## 2017-07-11 HISTORY — PX: ABDOMINAL AORTOGRAM: CATH118222

## 2017-07-11 HISTORY — PX: AORTIC ARCH ANGIOGRAPHY: CATH118224

## 2017-07-11 LAB — POCT I-STAT, CHEM 8
BUN: 28 mg/dL — AB (ref 8–23)
CHLORIDE: 101 mmol/L (ref 98–111)
Calcium, Ion: 1.16 mmol/L (ref 1.15–1.40)
Creatinine, Ser: 5.6 mg/dL — ABNORMAL HIGH (ref 0.61–1.24)
Glucose, Bld: 161 mg/dL — ABNORMAL HIGH (ref 70–99)
HEMATOCRIT: 41 % (ref 39.0–52.0)
Hemoglobin: 13.9 g/dL (ref 13.0–17.0)
Potassium: 3.1 mmol/L — ABNORMAL LOW (ref 3.5–5.1)
SODIUM: 142 mmol/L (ref 135–145)
TCO2: 26 mmol/L (ref 22–32)

## 2017-07-11 LAB — GLUCOSE, CAPILLARY: GLUCOSE-CAPILLARY: 139 mg/dL — AB (ref 70–99)

## 2017-07-11 SURGERY — UPPER EXTREMITY ANGIOGRAPHY
Anesthesia: LOCAL | Laterality: Right

## 2017-07-11 MED ORDER — MORPHINE SULFATE (PF) 10 MG/ML IV SOLN: 2.0000 mg | INTRAVENOUS | Status: DC | PRN

## 2017-07-11 MED ORDER — IODIXANOL 320 MG/ML IV SOLN
INTRAVENOUS | Status: DC | PRN
  Administered 2017-07-11: 245 mL via INTRA_ARTERIAL

## 2017-07-11 MED ORDER — DIPHENHYDRAMINE HCL 50 MG/ML IJ SOLN
INTRAMUSCULAR | Status: AC
  Filled 2017-07-11: qty 1

## 2017-07-11 MED ORDER — DIPHENHYDRAMINE HCL 25 MG PO CAPS
25.0000 mg | ORAL_CAPSULE | Freq: Four times a day (QID) | ORAL | Status: DC | PRN
  Filled 2017-07-11: qty 1

## 2017-07-11 MED ORDER — LIDOCAINE HCL (PF) 1 % IJ SOLN
INTRAMUSCULAR | Status: DC | PRN
  Administered 2017-07-11: 38 mL

## 2017-07-11 MED ORDER — SODIUM CHLORIDE 0.9 % IV SOLN
250.0000 mL | INTRAVENOUS | Status: DC | PRN
  Administered 2017-07-11: 250 mL via INTRAVENOUS

## 2017-07-11 MED ORDER — LIDOCAINE HCL (PF) 1 % IJ SOLN
INTRAMUSCULAR | Status: AC
  Filled 2017-07-11: qty 30

## 2017-07-11 MED ORDER — ONDANSETRON HCL 4 MG/2ML IJ SOLN: 4.0000 mg | Freq: Four times a day (QID) | INTRAMUSCULAR | Status: DC | PRN

## 2017-07-11 MED ORDER — FAMOTIDINE IN NACL 20-0.9 MG/50ML-% IV SOLN
20.0000 mg | INTRAVENOUS | Status: AC
  Administered 2017-07-11: 20 mg via INTRAVENOUS

## 2017-07-11 MED ORDER — ACETAMINOPHEN 325 MG PO TABS: 650.0000 mg | ORAL_TABLET | ORAL | Status: DC | PRN

## 2017-07-11 MED ORDER — SODIUM CHLORIDE 0.9% FLUSH: 3.0000 mL | Freq: Two times a day (BID) | INTRAVENOUS | Status: DC

## 2017-07-11 MED ORDER — SODIUM CHLORIDE 0.9% FLUSH: 3.0000 mL | INTRAVENOUS | Status: DC | PRN

## 2017-07-11 MED ORDER — FAMOTIDINE IN NACL 20-0.9 MG/50ML-% IV SOLN
INTRAVENOUS | Status: AC
  Filled 2017-07-11: qty 50

## 2017-07-11 MED ORDER — DIPHENHYDRAMINE HCL 50 MG/ML IJ SOLN
25.0000 mg | INTRAMUSCULAR | Status: AC
  Administered 2017-07-11: 25 mg via INTRAVENOUS

## 2017-07-11 MED ORDER — METHYLPREDNISOLONE SODIUM SUCC 125 MG IJ SOLR
INTRAMUSCULAR | Status: AC
  Filled 2017-07-11: qty 2

## 2017-07-11 MED ORDER — HEPARIN (PORCINE) IN NACL 1000-0.9 UT/500ML-% IV SOLN
INTRAVENOUS | Status: AC
  Filled 2017-07-11: qty 1000

## 2017-07-11 MED ORDER — HEPARIN (PORCINE) IN NACL 2-0.9 UNITS/ML
INTRAMUSCULAR | Status: AC | PRN
  Administered 2017-07-11 (×2): 500 mL

## 2017-07-11 MED ORDER — METHYLPREDNISOLONE SODIUM SUCC 125 MG IJ SOLR
125.0000 mg | INTRAMUSCULAR | Status: AC
  Administered 2017-07-11: 125 mg via INTRAVENOUS

## 2017-07-11 MED ORDER — LABETALOL HCL 5 MG/ML IV SOLN: 10.0000 mg | INTRAVENOUS | Status: DC | PRN

## 2017-07-11 MED ORDER — OXYCODONE HCL 5 MG PO TABS: 5.0000 mg | ORAL_TABLET | ORAL | Status: DC | PRN

## 2017-07-11 MED ORDER — HYDRALAZINE HCL 20 MG/ML IJ SOLN: 5.0000 mg | INTRAMUSCULAR | Status: DC | PRN

## 2017-07-11 SURGICAL SUPPLY — 14 items
CATH ANGIO 5F BER 100CM (CATHETERS) ×3 IMPLANT
CATH ANGIO 5F BER2 100CM (CATHETERS) ×3 IMPLANT
CATH ANGIO 5F PIGTAIL 100CM (CATHETERS) ×3 IMPLANT
CATH ANGIO 5F SIM1 100CM (CATHETERS) ×3 IMPLANT
COVER PRB 48X5XTLSCP FOLD TPE (BAG) ×2 IMPLANT
COVER PROBE 5X48 (BAG) ×1
GUIDEWIRE ANGLED .035X260CM (WIRE) ×3 IMPLANT
KIT PV (KITS) ×3 IMPLANT
SHEATH PINNACLE 5F 10CM (SHEATH) ×6 IMPLANT
SYR MEDRAD MARK V 150ML (SYRINGE) ×3 IMPLANT
TRANSDUCER W/STOPCOCK (MISCELLANEOUS) ×3 IMPLANT
TRAY PV CATH (CUSTOM PROCEDURE TRAY) ×3 IMPLANT
WIRE BENTSON .035X145CM (WIRE) ×3 IMPLANT
WIRE HITORQ VERSACORE ST 145CM (WIRE) ×3 IMPLANT

## 2017-07-11 NOTE — Progress Notes (Addendum)
K+ 3.1 on Istat.  Report called to cath lab (Amy) and she states she "will get an order".   Amy called back and stated that Dr Oneida Alar is "not worried about it" and no orders received

## 2017-07-11 NOTE — Telephone Encounter (Signed)
sch app vm NA 07/22/17 1230pm carotid 130pm L UE Art 07/24/17 130pm MD

## 2017-07-11 NOTE — Progress Notes (Signed)
Site area: left groin fa sheath Site Prior to Removal:  Level 0 Pressure Applied For:  20 minutes Manual:   yes Patient Status During Pull:  stable Post Pull Site:  Level 0 Post Pull Instructions Given:  yes Post Pull Pulses Present: bilat bka Dressing Applied:  Gauze and tegaderm Bedrest begins @ 1150 Comments: IV saline locked

## 2017-07-11 NOTE — Progress Notes (Signed)
Site area: rt fa sheath Site Prior to Removal:  Level 0 Pressure Applied For: 20 minutes Manual:   yes Patient Status During Pull:  stable Post Pull Site:  Level 0 Post Pull Instructions Given:  yes Post Pull Pulses Present: bilateral bka Dressing Applied:  Gauze and tegaderm Bedrest begins @  Comments:

## 2017-07-11 NOTE — Op Note (Signed)
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  The patient was placed in supine position Angio table.  Both groins were prepped and draped in usual sterile fashion.  Local anesthesia was then treated over the right common femoral artery.  Ultrasound was used to identify the right common femoral artery and femoral bifurcation.  Both arteries were patent.  A hardcopy image was obtained for the permanent medical record.  An introducer needle was used to cannulate the right common femoral artery and an 035 versacore wire threaded up in the abdominal aorta under fluoroscopic guidance.  There was some tortuosity in the right iliac system which made this a little more difficult.  A 5 French sheath was placed over the guidewire in the right common femoral artery.  This was thoroughly flushed with heparinized saline.  At this point a 5 [place] pig catheter was advanced over the guidewire and these were pushed as a unit up into the ascending aorta.  Arch aortogram was then obtained and 45 degree LAO projection.  This shows a type III arch with anomaly of the left vertebral coming off just proximal to the left subclavian on the aortic arch.  There is severe tortuosity of the right and left subclavian arteries.  At this point I pulled the pigtail catheter back over the guidewire and attempted to use a Berenstein 2 catheter it is likely catheterize the right subclavian artery.  I was able to advance the catheter into the innominate without difficulty but could not get the wire or the catheter to  engage the right subclavian artery as it seemed to prefer the right common carotid artery.  After several passes at this I exchange the Berenstein catheter out for a Naomi 1 catheter.  This was performed in the ascending aorta and again I was able to get this to engage in the innominate was able to advance the guidewire up into the mid common carotid to for extra wire support however after several attempts I was still unable to get the wire to advance out into the right subclavian artery.  I did make one more attempt at this using a Berenstein catheter.  This again was unsuccessful.  I then placed a Simmons 1 catheter back into the innominate and we were able to do right upper extremity views through the innominate artery.  This shows the right subclavian right axillary right brachial right radial and right interosseous arteries are all patent.  The ulnar artery is occluded.  There is good digital flow out into the thumb of the right hand.  However, there is minimal digital vessel filling after the palmar arch of all the remaining digits in the right hand.  At this point the [facility] 1 catheter was pulled down out of the innominate and I tried several attempts to cannulate the left subclavian artery.  However the vertebral artery would preferentially except the catheter each time.  The subclavian and vertebral or were in such proximity that every time I past the vertebral artery the catheter was dropped down into the descending aorta.  After several attempts with the [facility] 1 catheter and the [facility] 2 catheter we noticed that we  were having difficulty advancing the guidewire back up the right iliac system.  Contrast angiogram was performed which showed there was a dissection in the right common iliac artery.  At this point all the catheters and wires were removed from the right side the sheath was left in place.  Ultrasound was used to identify the left common femoral artery.  Local anesthesia was infiltrated  over this.  An ultrasound image was obtained to be placed in the permanent record.  The left common femoral profundofemoral femoral arteries were patent.  Introducer needle was used to cannulate the left common femoral artery and an 035 versacore wire threaded up in the abdominal aorta under fluoroscopic guidance.  A 5 French sheath was placed over the guidewire and left common femoral artery.  A 5 French rectal catheter was advanced over the guidewire and abdominal aortogram pelvic angiogram obtained and bilateral oblique projections.  This did not show any evidence of a dissection flap or any flow-limiting lesion.  At this point the pico catheter was removed over guidewire and the 5 French sheath left in place.  Both 5 French sheath was flushed with heparinized saline.  Attempts to further image the left subclavian system were aborted at this point due to the dissection.  The patient tolerated the procedure well complication was a dissection in the right common iliac artery which was non-flow-limiting but procedure aborted after this.  Operative management: The patient had adequate views of the right upper extremity today which shows no large vessel obstruction all the way to the hand and digital artery occlusions in the right hand non-unreconstructable.  We were unable to image more than the left proximal subclavian and the patient will be scheduled in the near future for a left upper extremity duplex ultrasound to see if there is any large vessel obstruction is reconstructable in the left side.  [name], MD [facility] Office: [phone] Pager: [phone]

## 2017-07-11 NOTE — Interval H&P Note (Signed)
History and Physical Interval Note:  07/11/2017 8:57 AM  Jack [name]  has presented today for surgery, with the diagnosis of poor flow  The various methods of treatment have been discussed with the patient and family. After consideration of risks, benefits and other options for treatment, the patient has consented to  Procedure(s): UPPER EXTREMITY ANGIOGRAPHY (N/A) as a surgical intervention .  The patient's history has been reviewed, patient examined, no change in status, stable for surgery.  I have reviewed the patient's chart and labs.  Questions were answered to the patient's satisfaction.     [name]

## 2017-07-11 NOTE — Discharge Instructions (Signed)

## 2017-07-13 DIAGNOSIS — N186 End stage renal disease: Secondary | ICD-10-CM | POA: Diagnosis not present

## 2017-07-13 DIAGNOSIS — C9000 Multiple myeloma not having achieved remission: Secondary | ICD-10-CM | POA: Diagnosis not present

## 2017-07-13 DIAGNOSIS — Z992 Dependence on renal dialysis: Secondary | ICD-10-CM | POA: Diagnosis not present

## 2017-07-14 ENCOUNTER — Emergency Department (HOSPITAL_COMMUNITY): Payer: Medicare Other

## 2017-07-14 ENCOUNTER — Encounter (HOSPITAL_COMMUNITY): Payer: Self-pay | Admitting: Vascular Surgery

## 2017-07-14 ENCOUNTER — Emergency Department (HOSPITAL_COMMUNITY)
Admission: EM | Admit: 2017-07-14 | Discharge: 2017-07-14 | Disposition: A | Discharge status: Home / Self Care | Payer: Medicare Other | Attending: Emergency Medicine | Admitting: Emergency Medicine

## 2017-07-14 ENCOUNTER — Other Ambulatory Visit: Payer: Self-pay

## 2017-07-14 DIAGNOSIS — Z87891 Personal history of nicotine dependence: Secondary | ICD-10-CM | POA: Insufficient documentation

## 2017-07-14 DIAGNOSIS — D631 Anemia in chronic kidney disease: Secondary | ICD-10-CM | POA: Diagnosis not present

## 2017-07-14 DIAGNOSIS — I120 Hypertensive chronic kidney disease with stage 5 chronic kidney disease or end stage renal disease: Secondary | ICD-10-CM | POA: Diagnosis not present

## 2017-07-14 DIAGNOSIS — N2581 Secondary hyperparathyroidism of renal origin: Secondary | ICD-10-CM | POA: Diagnosis not present

## 2017-07-14 DIAGNOSIS — E1029 Type 1 diabetes mellitus with other diabetic kidney complication: Secondary | ICD-10-CM | POA: Diagnosis not present

## 2017-07-14 DIAGNOSIS — W19XXXA Unspecified fall, initial encounter: Secondary | ICD-10-CM | POA: Diagnosis not present

## 2017-07-14 DIAGNOSIS — M549 Dorsalgia, unspecified: Secondary | ICD-10-CM | POA: Diagnosis not present

## 2017-07-14 DIAGNOSIS — H539 Unspecified visual disturbance: Secondary | ICD-10-CM | POA: Diagnosis not present

## 2017-07-14 DIAGNOSIS — M546 Pain in thoracic spine: Secondary | ICD-10-CM | POA: Diagnosis not present

## 2017-07-14 DIAGNOSIS — J45909 Unspecified asthma, uncomplicated: Secondary | ICD-10-CM | POA: Insufficient documentation

## 2017-07-14 DIAGNOSIS — N186 End stage renal disease: Secondary | ICD-10-CM | POA: Insufficient documentation

## 2017-07-14 DIAGNOSIS — M542 Cervicalgia: Secondary | ICD-10-CM | POA: Diagnosis not present

## 2017-07-14 DIAGNOSIS — Z89512 Acquired absence of left leg below knee: Secondary | ICD-10-CM | POA: Insufficient documentation

## 2017-07-14 DIAGNOSIS — D689 Coagulation defect, unspecified: Secondary | ICD-10-CM | POA: Diagnosis not present

## 2017-07-14 DIAGNOSIS — Z992 Dependence on renal dialysis: Secondary | ICD-10-CM | POA: Diagnosis not present

## 2017-07-14 DIAGNOSIS — Y999 Unspecified external cause status: Secondary | ICD-10-CM | POA: Insufficient documentation

## 2017-07-14 DIAGNOSIS — R51 Headache: Secondary | ICD-10-CM | POA: Diagnosis not present

## 2017-07-14 DIAGNOSIS — Z79899 Other long term (current) drug therapy: Secondary | ICD-10-CM | POA: Diagnosis not present

## 2017-07-14 DIAGNOSIS — E039 Hypothyroidism, unspecified: Secondary | ICD-10-CM | POA: Diagnosis not present

## 2017-07-14 DIAGNOSIS — E1122 Type 2 diabetes mellitus with diabetic chronic kidney disease: Secondary | ICD-10-CM | POA: Insufficient documentation

## 2017-07-14 DIAGNOSIS — Y939 Activity, unspecified: Secondary | ICD-10-CM | POA: Insufficient documentation

## 2017-07-14 DIAGNOSIS — S299XXA Unspecified injury of thorax, initial encounter: Secondary | ICD-10-CM | POA: Diagnosis not present

## 2017-07-14 DIAGNOSIS — Z7982 Long term (current) use of aspirin: Secondary | ICD-10-CM | POA: Diagnosis not present

## 2017-07-14 DIAGNOSIS — Z48812 Encounter for surgical aftercare following surgery on the circulatory system: Secondary | ICD-10-CM

## 2017-07-14 DIAGNOSIS — Z89511 Acquired absence of right leg below knee: Secondary | ICD-10-CM | POA: Insufficient documentation

## 2017-07-14 DIAGNOSIS — I959 Hypotension, unspecified: Secondary | ICD-10-CM | POA: Diagnosis not present

## 2017-07-14 DIAGNOSIS — S0990XA Unspecified injury of head, initial encounter: Secondary | ICD-10-CM | POA: Diagnosis not present

## 2017-07-14 DIAGNOSIS — Y929 Unspecified place or not applicable: Secondary | ICD-10-CM | POA: Diagnosis not present

## 2017-07-14 DIAGNOSIS — T82598A Other mechanical complication of other cardiac and vascular devices and implants, initial encounter: Secondary | ICD-10-CM | POA: Diagnosis not present

## 2017-07-14 MED ORDER — ACETAMINOPHEN 500 MG PO TABS
1000.0000 mg | ORAL_TABLET | Freq: Once | ORAL | Status: AC
  Administered 2017-07-14: 1000 mg via ORAL
  Filled 2017-07-14: qty 2

## 2017-07-14 MED FILL — Heparin Sod (Porcine)-NaCl IV Soln 1000 Unit/500ML-0.9%: INTRAVENOUS | Qty: 500 | Status: AC

## 2017-07-14 NOTE — ED Notes (Signed)
Pt discharged from ED; instructions provided; Pt encouraged to return to ED if symptoms worsen and to f/u with PCP; Pt verbalized understanding of all instructions 
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
## 2017-07-14 NOTE — ED Notes (Signed)
Pt to CT
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
## 2017-07-14 NOTE — Discharge Instructions (Addendum)
If you were given medicines take as directed.  If you are on coumadin or contraceptives realize their levels and effectiveness is altered by many different medicines.  If you have any reaction (rash, tongues swelling, other) to the medicines stop taking and see a physician.    If your blood pressure was elevated in the ER make sure you follow up for management with a primary doctor or return for chest pain, shortness of breath or stroke symptoms.  Please follow up as directed and return to the ER or see a physician for new or worsening symptoms.  Thank you. Vitals:   07/14/17 1157  Temp: 97.9 F (36.6 C)  TempSrc: Oral

## 2017-07-15 ENCOUNTER — Ambulatory Visit (INDEPENDENT_AMBULATORY_CARE_PROVIDER_SITE_OTHER): Payer: Medicare Other | Admitting: Family Medicine

## 2017-07-15 ENCOUNTER — Encounter: Payer: Self-pay | Admitting: Family Medicine

## 2017-07-15 VITALS — BP 133/86 | HR 76 | Temp 98.2°F | Resp 16 | Ht 70.0 in

## 2017-07-15 DIAGNOSIS — E1151 Type 2 diabetes mellitus with diabetic peripheral angiopathy without gangrene: Secondary | ICD-10-CM

## 2017-07-15 DIAGNOSIS — G547 Phantom limb syndrome without pain: Secondary | ICD-10-CM | POA: Diagnosis not present

## 2017-07-15 DIAGNOSIS — I739 Peripheral vascular disease, unspecified: Secondary | ICD-10-CM | POA: Diagnosis not present

## 2017-07-15 DIAGNOSIS — E876 Hypokalemia: Secondary | ICD-10-CM | POA: Diagnosis not present

## 2017-07-15 LAB — HEMOGLOBIN A1C: Hgb A1c MFr Bld: 6 % (ref 4.6–6.5)

## 2017-07-15 LAB — POTASSIUM: Potassium: 3.7 mEq/L (ref 3.5–5.1)
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
## 2017-07-15 NOTE — Patient Instructions (Addendum)
A few things to remember from today's visit:   Diabetes mellitus with peripheral vascular disease ([place]) - Plan: Hemoglobin A1c  Hypokalemia - Plan: Potassium  Peripheral vascular disease ([place])  No changes today.   Please be sure medication list is accurate. If a new problem present, please set up appointment sooner than planned today.

## 2017-07-18 ENCOUNTER — Telehealth: Payer: Self-pay | Admitting: Family Medicine

## 2017-07-18 ENCOUNTER — Other Ambulatory Visit: Payer: Self-pay | Admitting: *Deleted

## 2017-07-18 DIAGNOSIS — Z992 Dependence on renal dialysis: Secondary | ICD-10-CM | POA: Diagnosis not present

## 2017-07-18 DIAGNOSIS — D631 Anemia in chronic kidney disease: Secondary | ICD-10-CM | POA: Diagnosis not present

## 2017-07-18 DIAGNOSIS — E1029 Type 1 diabetes mellitus with other diabetic kidney complication: Secondary | ICD-10-CM | POA: Diagnosis not present

## 2017-07-18 DIAGNOSIS — M542 Cervicalgia: Secondary | ICD-10-CM

## 2017-07-18 DIAGNOSIS — N186 End stage renal disease: Secondary | ICD-10-CM | POA: Diagnosis not present

## 2017-07-18 DIAGNOSIS — D689 Coagulation defect, unspecified: Secondary | ICD-10-CM | POA: Diagnosis not present

## 2017-07-18 DIAGNOSIS — T82598A Other mechanical complication of other cardiac and vascular devices and implants, initial encounter: Secondary | ICD-10-CM | POA: Diagnosis not present

## 2017-07-18 DIAGNOSIS — N2581 Secondary hyperparathyroidism of renal origin: Secondary | ICD-10-CM | POA: Diagnosis not present

## 2017-07-18 NOTE — Telephone Encounter (Signed)
Patient informed that referral was placed as requested.

## 2017-07-18 NOTE — Telephone Encounter (Signed)
Message sent to Dr. Jordan for review and approval. 
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
## 2017-07-20 ENCOUNTER — Encounter: Payer: Self-pay | Admitting: Family Medicine

## 2017-07-21 DIAGNOSIS — T82598A Other mechanical complication of other cardiac and vascular devices and implants, initial encounter: Secondary | ICD-10-CM | POA: Diagnosis not present

## 2017-07-21 DIAGNOSIS — N186 End stage renal disease: Secondary | ICD-10-CM | POA: Diagnosis not present

## 2017-07-21 DIAGNOSIS — N2581 Secondary hyperparathyroidism of renal origin: Secondary | ICD-10-CM | POA: Diagnosis not present

## 2017-07-21 DIAGNOSIS — D631 Anemia in chronic kidney disease: Secondary | ICD-10-CM | POA: Diagnosis not present

## 2017-07-21 DIAGNOSIS — Z992 Dependence on renal dialysis: Secondary | ICD-10-CM | POA: Diagnosis not present

## 2017-07-21 DIAGNOSIS — E1029 Type 1 diabetes mellitus with other diabetic kidney complication: Secondary | ICD-10-CM | POA: Diagnosis not present

## 2017-07-21 DIAGNOSIS — D689 Coagulation defect, unspecified: Secondary | ICD-10-CM | POA: Diagnosis not present

## 2017-07-22 ENCOUNTER — Ambulatory Visit (HOSPITAL_COMMUNITY)
Admit: 2017-07-22 | Discharge: 2017-07-22 | Disposition: A | Discharge status: Home / Self Care | Payer: Medicare Other | Source: Ambulatory Visit | Attending: Vascular Surgery | Admitting: Vascular Surgery

## 2017-07-22 ENCOUNTER — Ambulatory Visit (HOSPITAL_COMMUNITY)
Admit: 2017-07-22 | Discharge: 2017-07-22 | Disposition: A | Discharge status: Home / Self Care | Payer: Medicare Other | Attending: Vascular Surgery | Admitting: Vascular Surgery

## 2017-07-22 DIAGNOSIS — Z992 Dependence on renal dialysis: Secondary | ICD-10-CM | POA: Diagnosis not present

## 2017-07-22 DIAGNOSIS — N186 End stage renal disease: Secondary | ICD-10-CM | POA: Diagnosis not present

## 2017-07-22 DIAGNOSIS — I6523 Occlusion and stenosis of bilateral carotid arteries: Secondary | ICD-10-CM | POA: Insufficient documentation

## 2017-07-22 DIAGNOSIS — Z48812 Encounter for surgical aftercare following surgery on the circulatory system: Secondary | ICD-10-CM | POA: Insufficient documentation

## 2017-07-22 DIAGNOSIS — I70208 Unspecified atherosclerosis of native arteries of extremities, other extremity: Secondary | ICD-10-CM | POA: Diagnosis not present

## 2017-07-24 ENCOUNTER — Encounter: Payer: Self-pay | Admitting: Vascular Surgery

## 2017-07-24 ENCOUNTER — Ambulatory Visit (INDEPENDENT_AMBULATORY_CARE_PROVIDER_SITE_OTHER): Payer: Medicare Other | Admitting: Vascular Surgery

## 2017-07-24 VITALS — BP 135/89 | HR 80 | Temp 98.5°F | Resp 16 | Wt 148.0 lb

## 2017-07-24 DIAGNOSIS — I739 Peripheral vascular disease, unspecified: Secondary | ICD-10-CM | POA: Diagnosis not present

## 2017-07-24 NOTE — Progress Notes (Signed)
Patient is a 72 year old male who returns for follow-up today.  He recently underwent right upper extremity arteriogram which showed unreconstructable occlusive digital artery disease in his right hand.  All large vessels down to the radial and ulnar and interosseous arteries were patent.  The prior amputation site on his right hand is slowly healing.  He still has gangrenous changes on the left fourth finger and remnant of his second finger.  Due to tortuosity of his aortic arch we were unable to do an arteriogram on the left upper extremity and he presents today after recent duplex of the left upper extremity.  He denies any drainage from his wounds.  He denies any fever or chills.  Review of systems: He has shortness of breath with exertion.  Denies chest pain.  He is on hemodialysis. he has had bilateral below-knee amputation  Current Outpatient Medications on File Prior to Visit  Medication Sig Dispense Refill  . aspirin EC 81 MG tablet Take 81 mg by mouth daily.    Marland Kitchen atorvastatin (LIPITOR) 10 MG tablet TAKE 1 TABLET (10 MG TOTAL) BY MOUTH DAILY. 90 tablet 1  . Continuous Blood Gluc Sensor (FREESTYLE LIBRE 14 DAY SENSOR) MISC USE DAILY*NOT COVERED* 1 each 1  . Continuous Blood Gluc Sensor (FREESTYLE LIBRE 14 DAY SENSOR) MISC USE DAILY*NOT COVERED* 2 each 11  . lanthanum (FOSRENOL) 1000 MG chewable tablet Chew 1,000 mg by mouth daily.   6  . levothyroxine (SYNTHROID, LEVOTHROID) 150 MCG tablet TAKE 1/2 TABLET BY MOUTH ON MONDAYS AND 1 TABLET BY MOUTH THE REST OF THE WEEK. 90 tablet 1  . multivitamin (RENA-VIT) TABS tablet Take 1 tablet by mouth 4 (four) times a week.     . naproxen sodium (ALEVE) 220 MG tablet Take 220 mg by mouth daily as needed (pain).    Marland Kitchen oxyCODONE-acetaminophen (PERCOCET) 5-325 MG tablet 1-2 tabs po q6 hours prn pain (Patient taking differently: Take 1-2 tablets by mouth every 6 (six) hours as needed for moderate pain or severe pain. 1-2 tabs po q6 hours prn pain) 20 tablet 0    No current facility-administered medications on file prior to visit.     Past Medical History:  Diagnosis Date  . Anemia   . Arthritis   . Asthma   . Contracture of joint    left knee  . Decubitus ulcer of sacral region, stage 2 03/07/2014  . Diabetes mellitus with peripheral vascular disease ([place])   . End-stage renal disease on hemodialysis [facility])    Started HD March 2014.  Cause of ESRD was DM.  Gets HD at [facility] on [address] [place] on MWF schedule.   Marland Kitchen ESRD on hemodialysis ([place])   . Gangrene ([place])    right BKA  . Gangrene ([place])    right index finger  . Gangrene of foot ([place])   . Glaucoma   . Glaucoma 03/07/2014  . Heart murmur   . History of MRSA infection 04/22/2013   Bacteremia assoc w L foot wound infection Mar 2015   . Hyperparathyroidism, secondary renal ([place])   . Hypertension   . Hypothyroidism   . MRSA bacteremia   . Multiple myeloma, without mention of having achieved remission 03/30/2012   Cytogenetic neg on 03/23/2012.  Marland Kitchen Necrosis ([place])    left long finger  . Peripheral arterial disease ([place])   . Peripheral vascular disease, unspecified ([place]) 11/19/2012   In the past had R foot toe amps then R TMA. In 2015 had left foot toe  amp > then TMA >then L BKA on 05/14/13   . Pneumonia    2012  . PONV (postoperative nausea and vomiting)   . Thyroid disease    hyperparathyroidism   Physical exam:  Vitals:   07/24/17 1409  BP: 135/89  Pulse: 80  Resp: 16  Temp: 98.5 F (36.9 C)  TempSrc: Oral  SpO2: 98%  Weight: 148 lb (67.1 kg)    Extremities: Slowly healing right hand finger amputation site, dry gangrenous changes left fourth finger and stump of left second finger  Data: Patient had an upper extremity arterial duplex exam of the left side today which I reviewed and interpreted.  This again showed all large vessels are patent.  The axillary subclavian brachial radial and ulnar arteries were all patent.  Assessment: Small vessel occlusive disease digital arteries  bilaterally.  Unreconstructable.  No large vessel disease.  Plan: No vascular intervention possible for his distal digital artery occlusive disease.  The patient will follow-up on an as-needed basis.  [name], MD [facility] Office: [phone] Pager: [phone]

## 2017-07-25 DIAGNOSIS — N186 End stage renal disease: Secondary | ICD-10-CM | POA: Diagnosis not present

## 2017-07-25 DIAGNOSIS — Z992 Dependence on renal dialysis: Secondary | ICD-10-CM | POA: Diagnosis not present

## 2017-07-25 DIAGNOSIS — N2581 Secondary hyperparathyroidism of renal origin: Secondary | ICD-10-CM | POA: Diagnosis not present

## 2017-07-25 DIAGNOSIS — T82598A Other mechanical complication of other cardiac and vascular devices and implants, initial encounter: Secondary | ICD-10-CM | POA: Diagnosis not present

## 2017-07-25 DIAGNOSIS — E1029 Type 1 diabetes mellitus with other diabetic kidney complication: Secondary | ICD-10-CM | POA: Diagnosis not present

## 2017-07-25 DIAGNOSIS — D631 Anemia in chronic kidney disease: Secondary | ICD-10-CM | POA: Diagnosis not present

## 2017-07-25 DIAGNOSIS — D689 Coagulation defect, unspecified: Secondary | ICD-10-CM | POA: Diagnosis not present

## 2017-07-28 DIAGNOSIS — E1029 Type 1 diabetes mellitus with other diabetic kidney complication: Secondary | ICD-10-CM | POA: Diagnosis not present

## 2017-07-28 DIAGNOSIS — D689 Coagulation defect, unspecified: Secondary | ICD-10-CM | POA: Diagnosis not present

## 2017-07-28 DIAGNOSIS — Z992 Dependence on renal dialysis: Secondary | ICD-10-CM | POA: Diagnosis not present

## 2017-07-28 DIAGNOSIS — N186 End stage renal disease: Secondary | ICD-10-CM | POA: Diagnosis not present

## 2017-07-28 DIAGNOSIS — T82598A Other mechanical complication of other cardiac and vascular devices and implants, initial encounter: Secondary | ICD-10-CM | POA: Diagnosis not present

## 2017-07-28 DIAGNOSIS — N2581 Secondary hyperparathyroidism of renal origin: Secondary | ICD-10-CM | POA: Diagnosis not present

## 2017-07-28 DIAGNOSIS — D631 Anemia in chronic kidney disease: Secondary | ICD-10-CM | POA: Diagnosis not present

## 2017-07-29 ENCOUNTER — Other Ambulatory Visit: Payer: Self-pay | Admitting: Orthopedic Surgery

## 2017-07-29 ENCOUNTER — Ambulatory Visit (HOSPITAL_BASED_OUTPATIENT_CLINIC_OR_DEPARTMENT_OTHER): Payer: Medicare Other | Admitting: Anesthesiology

## 2017-07-29 ENCOUNTER — Ambulatory Visit (HOSPITAL_BASED_OUTPATIENT_CLINIC_OR_DEPARTMENT_OTHER)
Admission: RE | Admit: 2017-07-29 | Discharge: 2017-07-29 | Disposition: A | Discharge status: Home / Self Care | Payer: Medicare Other | Source: Ambulatory Visit | Attending: Orthopedic Surgery | Admitting: Orthopedic Surgery

## 2017-07-29 ENCOUNTER — Encounter (HOSPITAL_BASED_OUTPATIENT_CLINIC_OR_DEPARTMENT_OTHER): Payer: Self-pay | Admitting: *Deleted

## 2017-07-29 ENCOUNTER — Other Ambulatory Visit: Payer: Self-pay

## 2017-07-29 ENCOUNTER — Encounter (HOSPITAL_BASED_OUTPATIENT_CLINIC_OR_DEPARTMENT_OTHER)
Admission: RE | Disposition: A | Discharge status: Home / Self Care | Payer: Self-pay | Source: Ambulatory Visit | Attending: Orthopedic Surgery

## 2017-07-29 DIAGNOSIS — Z89512 Acquired absence of left leg below knee: Secondary | ICD-10-CM

## 2017-07-29 DIAGNOSIS — Z91030 Bee allergy status: Secondary | ICD-10-CM | POA: Insufficient documentation

## 2017-07-29 DIAGNOSIS — H409 Unspecified glaucoma: Secondary | ICD-10-CM

## 2017-07-29 DIAGNOSIS — R188 Other ascites: Secondary | ICD-10-CM | POA: Diagnosis not present

## 2017-07-29 DIAGNOSIS — K920 Hematemesis: Secondary | ICD-10-CM | POA: Diagnosis not present

## 2017-07-29 DIAGNOSIS — E11649 Type 2 diabetes mellitus with hypoglycemia without coma: Secondary | ICD-10-CM | POA: Diagnosis not present

## 2017-07-29 DIAGNOSIS — D62 Acute posthemorrhagic anemia: Secondary | ICD-10-CM | POA: Diagnosis not present

## 2017-07-29 DIAGNOSIS — I96 Gangrene, not elsewhere classified: Secondary | ICD-10-CM

## 2017-07-29 DIAGNOSIS — Z89029 Acquired absence of unspecified finger(s): Secondary | ICD-10-CM | POA: Insufficient documentation

## 2017-07-29 DIAGNOSIS — D649 Anemia, unspecified: Secondary | ICD-10-CM | POA: Insufficient documentation

## 2017-07-29 DIAGNOSIS — J45909 Unspecified asthma, uncomplicated: Secondary | ICD-10-CM

## 2017-07-29 DIAGNOSIS — C787 Secondary malignant neoplasm of liver and intrahepatic bile duct: Secondary | ICD-10-CM | POA: Diagnosis not present

## 2017-07-29 DIAGNOSIS — Z885 Allergy status to narcotic agent status: Secondary | ICD-10-CM | POA: Insufficient documentation

## 2017-07-29 DIAGNOSIS — R1114 Bilious vomiting: Secondary | ICD-10-CM | POA: Diagnosis not present

## 2017-07-29 DIAGNOSIS — Z841 Family history of disorders of kidney and ureter: Secondary | ICD-10-CM | POA: Insufficient documentation

## 2017-07-29 DIAGNOSIS — N2581 Secondary hyperparathyroidism of renal origin: Secondary | ICD-10-CM

## 2017-07-29 DIAGNOSIS — Z809 Family history of malignant neoplasm, unspecified: Secondary | ICD-10-CM

## 2017-07-29 DIAGNOSIS — Z9841 Cataract extraction status, right eye: Secondary | ICD-10-CM

## 2017-07-29 DIAGNOSIS — C182 Malignant neoplasm of ascending colon: Secondary | ICD-10-CM | POA: Diagnosis not present

## 2017-07-29 DIAGNOSIS — Z8614 Personal history of Methicillin resistant Staphylococcus aureus infection: Secondary | ICD-10-CM

## 2017-07-29 DIAGNOSIS — E1122 Type 2 diabetes mellitus with diabetic chronic kidney disease: Secondary | ICD-10-CM | POA: Insufficient documentation

## 2017-07-29 DIAGNOSIS — N186 End stage renal disease: Secondary | ICD-10-CM

## 2017-07-29 DIAGNOSIS — Z9842 Cataract extraction status, left eye: Secondary | ICD-10-CM | POA: Insufficient documentation

## 2017-07-29 DIAGNOSIS — Z91041 Radiographic dye allergy status: Secondary | ICD-10-CM

## 2017-07-29 DIAGNOSIS — R079 Chest pain, unspecified: Secondary | ICD-10-CM | POA: Diagnosis not present

## 2017-07-29 DIAGNOSIS — I120 Hypertensive chronic kidney disease with stage 5 chronic kidney disease or end stage renal disease: Secondary | ICD-10-CM | POA: Insufficient documentation

## 2017-07-29 DIAGNOSIS — E1139 Type 2 diabetes mellitus with other diabetic ophthalmic complication: Secondary | ICD-10-CM | POA: Diagnosis not present

## 2017-07-29 DIAGNOSIS — Z89511 Acquired absence of right leg below knee: Secondary | ICD-10-CM

## 2017-07-29 DIAGNOSIS — N185 Chronic kidney disease, stage 5: Secondary | ICD-10-CM | POA: Diagnosis not present

## 2017-07-29 DIAGNOSIS — Z87891 Personal history of nicotine dependence: Secondary | ICD-10-CM

## 2017-07-29 DIAGNOSIS — R011 Cardiac murmur, unspecified: Secondary | ICD-10-CM

## 2017-07-29 DIAGNOSIS — E1152 Type 2 diabetes mellitus with diabetic peripheral angiopathy with gangrene: Secondary | ICD-10-CM | POA: Insufficient documentation

## 2017-07-29 DIAGNOSIS — Z992 Dependence on renal dialysis: Secondary | ICD-10-CM | POA: Insufficient documentation

## 2017-07-29 DIAGNOSIS — R072 Precordial pain: Secondary | ICD-10-CM | POA: Diagnosis not present

## 2017-07-29 DIAGNOSIS — Z515 Encounter for palliative care: Secondary | ICD-10-CM | POA: Diagnosis not present

## 2017-07-29 DIAGNOSIS — I1311 Hypertensive heart and chronic kidney disease without heart failure, with stage 5 chronic kidney disease, or end stage renal disease: Secondary | ICD-10-CM | POA: Diagnosis not present

## 2017-07-29 DIAGNOSIS — E8889 Other specified metabolic disorders: Secondary | ICD-10-CM | POA: Diagnosis not present

## 2017-07-29 DIAGNOSIS — Z7982 Long term (current) use of aspirin: Secondary | ICD-10-CM

## 2017-07-29 DIAGNOSIS — Z833 Family history of diabetes mellitus: Secondary | ICD-10-CM

## 2017-07-29 DIAGNOSIS — K6389 Other specified diseases of intestine: Secondary | ICD-10-CM | POA: Diagnosis not present

## 2017-07-29 DIAGNOSIS — C9000 Multiple myeloma not having achieved remission: Secondary | ICD-10-CM

## 2017-07-29 DIAGNOSIS — C9001 Multiple myeloma in remission: Secondary | ICD-10-CM | POA: Diagnosis not present

## 2017-07-29 DIAGNOSIS — M86142 Other acute osteomyelitis, left hand: Secondary | ICD-10-CM | POA: Diagnosis not present

## 2017-07-29 DIAGNOSIS — G546 Phantom limb syndrome with pain: Secondary | ICD-10-CM | POA: Diagnosis not present

## 2017-07-29 DIAGNOSIS — M869 Osteomyelitis, unspecified: Secondary | ICD-10-CM

## 2017-07-29 DIAGNOSIS — C7801 Secondary malignant neoplasm of right lung: Secondary | ICD-10-CM | POA: Diagnosis not present

## 2017-07-29 DIAGNOSIS — C786 Secondary malignant neoplasm of retroperitoneum and peritoneum: Secondary | ICD-10-CM | POA: Diagnosis not present

## 2017-07-29 DIAGNOSIS — M1990 Unspecified osteoarthritis, unspecified site: Secondary | ICD-10-CM | POA: Insufficient documentation

## 2017-07-29 DIAGNOSIS — C801 Malignant (primary) neoplasm, unspecified: Secondary | ICD-10-CM | POA: Diagnosis not present

## 2017-07-29 DIAGNOSIS — E440 Moderate protein-calorie malnutrition: Secondary | ICD-10-CM | POA: Diagnosis not present

## 2017-07-29 DIAGNOSIS — Z888 Allergy status to other drugs, medicaments and biological substances status: Secondary | ICD-10-CM

## 2017-07-29 DIAGNOSIS — R110 Nausea: Secondary | ICD-10-CM | POA: Diagnosis not present

## 2017-07-29 DIAGNOSIS — Z66 Do not resuscitate: Secondary | ICD-10-CM | POA: Diagnosis not present

## 2017-07-29 DIAGNOSIS — Z79899 Other long term (current) drug therapy: Secondary | ICD-10-CM | POA: Insufficient documentation

## 2017-07-29 DIAGNOSIS — C7802 Secondary malignant neoplasm of left lung: Secondary | ICD-10-CM | POA: Diagnosis not present

## 2017-07-29 HISTORY — PX: I&D EXTREMITY: SHX5045

## 2017-07-29 HISTORY — PX: I & D EXTREMITY: SHX5045

## 2017-07-29 HISTORY — PX: AMPUTATION: SHX166

## 2017-07-29 LAB — POCT I-STAT, CHEM 8
BUN: 20 mg/dL (ref 8–23)
CREATININE: 5.4 mg/dL — AB (ref 0.61–1.24)
Calcium, Ion: 1.12 mmol/L — ABNORMAL LOW (ref 1.15–1.40)
Chloride: 98 mmol/L (ref 98–111)
GLUCOSE: 100 mg/dL — AB (ref 70–99)
HCT: 37 % — ABNORMAL LOW (ref 39.0–52.0)
Hemoglobin: 12.6 g/dL — ABNORMAL LOW (ref 13.0–17.0)
Potassium: 3.7 mmol/L (ref 3.5–5.1)
Sodium: 140 mmol/L (ref 135–145)
TCO2: 27 mmol/L (ref 22–32)

## 2017-07-29 LAB — GLUCOSE, CAPILLARY: Glucose-Capillary: 111 mg/dL — ABNORMAL HIGH (ref 70–99)

## 2017-07-29 SURGERY — AMPUTATION DIGIT
Anesthesia: General | Site: Ring Finger | Laterality: Left

## 2017-07-29 MED ORDER — FENTANYL CITRATE (PF) 100 MCG/2ML IJ SOLN
25.0000 ug | INTRAMUSCULAR | Status: DC | PRN
  Administered 2017-07-29 (×2): 25 ug via INTRAVENOUS

## 2017-07-29 MED ORDER — ONDANSETRON HCL 4 MG/2ML IJ SOLN: 4.0000 mg | Freq: Once | INTRAMUSCULAR | Status: DC | PRN

## 2017-07-29 MED ORDER — ONDANSETRON HCL 4 MG/2ML IJ SOLN
INTRAMUSCULAR | Status: DC | PRN
  Administered 2017-07-29: 4 mg via INTRAVENOUS

## 2017-07-29 MED ORDER — FENTANYL CITRATE (PF) 100 MCG/2ML IJ SOLN
INTRAMUSCULAR | Status: AC
  Filled 2017-07-29: qty 2

## 2017-07-29 MED ORDER — MIDAZOLAM HCL 2 MG/2ML IJ SOLN: 1.0000 mg | INTRAMUSCULAR | Status: DC | PRN

## 2017-07-29 MED ORDER — CEFAZOLIN SODIUM-DEXTROSE 2-4 GM/100ML-% IV SOLN
2.0000 g | INTRAVENOUS | Status: AC
  Administered 2017-07-29: 2 g via INTRAVENOUS

## 2017-07-29 MED ORDER — PHENYLEPHRINE 40 MCG/ML (10ML) SYRINGE FOR IV PUSH (FOR BLOOD PRESSURE SUPPORT)
PREFILLED_SYRINGE | INTRAVENOUS | Status: DC | PRN
  Administered 2017-07-29 (×2): 80 ug via INTRAVENOUS
  Administered 2017-07-29: 120 ug via INTRAVENOUS
  Administered 2017-07-29: 80 ug via INTRAVENOUS

## 2017-07-29 MED ORDER — LIDOCAINE HCL (CARDIAC) PF 100 MG/5ML IV SOSY
PREFILLED_SYRINGE | INTRAVENOUS | Status: DC | PRN
  Administered 2017-07-29: 50 mg via INTRAVENOUS

## 2017-07-29 MED ORDER — BUPIVACAINE HCL (PF) 0.25 % IJ SOLN
INTRAMUSCULAR | Status: DC | PRN
  Administered 2017-07-29: 6 mL

## 2017-07-29 MED ORDER — LACTATED RINGERS IV SOLN
INTRAVENOUS | Status: DC
  Administered 2017-07-29: 14:00:00 via INTRAVENOUS

## 2017-07-29 MED ORDER — OXYCODONE HCL 5 MG PO TABS
ORAL_TABLET | ORAL | 0 refills | Status: AC
Start: 2017-07-29 — End: ?

## 2017-07-29 MED ORDER — SCOPOLAMINE 1 MG/3DAYS TD PT72: 1.0000 | MEDICATED_PATCH | Freq: Once | TRANSDERMAL | Status: DC | PRN

## 2017-07-29 MED ORDER — PROPOFOL 10 MG/ML IV BOLUS
INTRAVENOUS | Status: DC | PRN
Start: 2017-07-29 — End: 2017-07-29
  Administered 2017-07-29: 100 mg via INTRAVENOUS

## 2017-07-29 MED ORDER — CHLORHEXIDINE GLUCONATE 4 % EX LIQD: 60.0000 mL | Freq: Once | CUTANEOUS | Status: DC

## 2017-07-29 MED ORDER — ONDANSETRON HCL 4 MG PO TABS: 4.0000 mg | ORAL_TABLET | Freq: Three times a day (TID) | ORAL | 0 refills | Status: AC | PRN

## 2017-07-29 MED ORDER — CEFAZOLIN SODIUM-DEXTROSE 2-4 GM/100ML-% IV SOLN
INTRAVENOUS | Status: AC
  Filled 2017-07-29: qty 100

## 2017-07-29 MED ORDER — FENTANYL CITRATE (PF) 100 MCG/2ML IJ SOLN
50.0000 ug | INTRAMUSCULAR | Status: DC | PRN
  Administered 2017-07-29: 50 ug via INTRAVENOUS

## 2017-07-29 SURGICAL SUPPLY — 57 items
BAG DECANTER FOR FLEXI CONT (MISCELLANEOUS) IMPLANT
BANDAGE ACE 3X5.8 VEL STRL LF (GAUZE/BANDAGES/DRESSINGS) IMPLANT
BLADE MINI RND TIP GREEN BEAV (BLADE) IMPLANT
BLADE SURG 15 STRL LF DISP TIS (BLADE) ×2 IMPLANT
BLADE SURG 15 STRL SS (BLADE) ×4
BNDG COHESIVE 1X5 TAN STRL LF (GAUZE/BANDAGES/DRESSINGS) IMPLANT
BNDG ELASTIC 2X5.8 VLCR STR LF (GAUZE/BANDAGES/DRESSINGS) IMPLANT
BNDG ESMARK 4X9 LF (GAUZE/BANDAGES/DRESSINGS) IMPLANT
BNDG GAUZE 1X2.1 STRL (MISCELLANEOUS) IMPLANT
BNDG GAUZE ELAST 4 BULKY (GAUZE/BANDAGES/DRESSINGS) IMPLANT
BRUSH SCRUB EZ PLAIN DRY (MISCELLANEOUS) ×3 IMPLANT
CHLORAPREP W/TINT 26ML (MISCELLANEOUS) ×3 IMPLANT
CORD BIPOLAR FORCEPS 12FT (ELECTRODE) ×3 IMPLANT
COVER BACK TABLE 60X90IN (DRAPES) ×3 IMPLANT
COVER MAYO STAND STRL (DRAPES) ×3 IMPLANT
CUFF TOURNIQUET SINGLE 18IN (TOURNIQUET CUFF) ×3 IMPLANT
DECANTER SPIKE VIAL GLASS SM (MISCELLANEOUS) IMPLANT
DRAIN PENROSE 1/4X12 LTX STRL (WOUND CARE) IMPLANT
DRAPE EXTREMITY T 121X128X90 (DRAPE) ×3 IMPLANT
DRAPE OEC MINIVIEW 54X84 (DRAPES) IMPLANT
DRAPE SURG 17X23 STRL (DRAPES) ×3 IMPLANT
GAUZE PACKING IODOFORM 1/4X15 (GAUZE/BANDAGES/DRESSINGS) IMPLANT
GAUZE SPONGE 4X4 12PLY STRL (GAUZE/BANDAGES/DRESSINGS) ×3 IMPLANT
GAUZE SPONGE 4X4 12PLY STRL LF (GAUZE/BANDAGES/DRESSINGS) IMPLANT
GAUZE XEROFORM 1X8 LF (GAUZE/BANDAGES/DRESSINGS) ×3 IMPLANT
GLOVE BIO SURGEON STRL SZ7.5 (GLOVE) ×3 IMPLANT
GLOVE BIOGEL PI IND STRL 8 (GLOVE) ×1 IMPLANT
GLOVE BIOGEL PI INDICATOR 8 (GLOVE) ×2
GOWN STRL REUS W/ TWL LRG LVL3 (GOWN DISPOSABLE) ×1 IMPLANT
GOWN STRL REUS W/TWL LRG LVL3 (GOWN DISPOSABLE) ×2
GOWN STRL REUS W/TWL XL LVL3 (GOWN DISPOSABLE) ×3 IMPLANT
LOOP VESSEL MAXI BLUE (MISCELLANEOUS) IMPLANT
NEEDLE HYPO 25X1 1.5 SAFETY (NEEDLE) IMPLANT
NS IRRIG 1000ML POUR BTL (IV SOLUTION) ×3 IMPLANT
PACK BASIN DAY SURGERY FS (CUSTOM PROCEDURE TRAY) ×3 IMPLANT
PAD CAST 3X4 CTTN HI CHSV (CAST SUPPLIES) IMPLANT
PADDING CAST ABS 4INX4YD NS (CAST SUPPLIES) ×2
PADDING CAST ABS COTTON 4X4 ST (CAST SUPPLIES) ×1 IMPLANT
PADDING CAST COTTON 3X4 STRL (CAST SUPPLIES)
SPLINT PLASTER CAST XFAST 3X15 (CAST SUPPLIES) IMPLANT
SPLINT PLASTER XTRA FASTSET 3X (CAST SUPPLIES)
STOCKINETTE 4X48 STRL (DRAPES) ×3 IMPLANT
SUT CHROMIC 4 0 P 3 18 (SUTURE) IMPLANT
SUT ETHILON 3 0 PS 1 (SUTURE) IMPLANT
SUT ETHILON 4 0 PS 2 18 (SUTURE) IMPLANT
SUT MERSILENE 4 0 P 3 (SUTURE) IMPLANT
SUT MON AB 5-0 P3 18 (SUTURE) IMPLANT
SUT VIC AB 4-0 P-3 18XBRD (SUTURE) IMPLANT
SUT VIC AB 4-0 P3 18 (SUTURE)
SWAB COLLECTION DEVICE MRSA (MISCELLANEOUS) IMPLANT
SWAB CULTURE ESWAB REG 1ML (MISCELLANEOUS) IMPLANT
SYR BULB 3OZ (MISCELLANEOUS) ×3 IMPLANT
SYR CONTROL 10ML LL (SYRINGE) IMPLANT
TOWEL GREEN STERILE FF (TOWEL DISPOSABLE) ×6 IMPLANT
TRAY DSU PREP LF (CUSTOM PROCEDURE TRAY) ×3 IMPLANT
TUBE FEEDING ENTERAL 5FR 16IN (TUBING) IMPLANT
UNDERPAD 30X30 (UNDERPADS AND DIAPERS) ×3 IMPLANT

## 2017-07-29 NOTE — Anesthesia Procedure Notes (Signed)
Performed by: Juwuan Sedita W, CRNA       

## 2017-07-29 NOTE — Transfer of Care (Signed)
Immediate Anesthesia Transfer of Care Note  Patient: Jack [name]  Procedure(s) Performed: LEFT RING FINGER AMPUTATION (Left Ring Finger) DEBRIDEMENT WOUND (Left Ring Finger)  Patient Location: PACU  Anesthesia Type:General  Level of Consciousness: sedated  Airway & Oxygen Therapy: Patient Spontanous Breathing and Patient connected to face mask oxygen  Post-op Assessment: Report given to RN and Post -op Vital signs reviewed and stable  Post vital signs: Reviewed and stable  Last Vitals:  Vitals Value Taken Time  BP 110/78 07/29/2017  4:08 PM  Temp    Pulse 76 07/29/2017  4:08 PM  Resp 21 07/29/2017  4:09 PM  SpO2 100 % 07/29/2017  4:08 PM  Vitals shown include unvalidated device data.  Last Pain:  Vitals:   07/29/17 1413  TempSrc: Oral  PainSc: 0-No pain      Patients Stated Pain Goal: 3 (26/94/85 [id number])  Complications: No apparent anesthesia complications

## 2017-07-29 NOTE — H&P (Signed)
Jack [name] is an 72 y.o. male.   Chief Complaint: left ring finger gangrene HPI: 72 yo male s/p multiple digit amputations for gangrene with necrosis of left ring finger.  This is painful.  He wishes to proceed with amputation of left ring finger and evaluation of adjacent long finger surgical wound.  Allergies:  Allergies  Allergen Reactions  . Bee Venom Anaphylaxis  . Ivp Dye [Iodinated Diagnostic Agents] Hives  . Lisinopril Cough  . Morphine And Related Hives and Other (See Comments)    Bradycardia     Past Medical History:  Diagnosis Date  . Anemia   . Arthritis   . Asthma   . Contracture of joint    left knee  . Decubitus ulcer of sacral region, stage 2 03/07/2014  . Diabetes mellitus with peripheral vascular disease ([place])   . End-stage renal disease on hemodialysis [facility])    Started HD March 2014.  Cause of ESRD was DM.  Gets HD at [facility] on [address] on MWF schedule.   Marland Kitchen ESRD on hemodialysis ([place])   . Gangrene ([place])    right BKA  . Gangrene ([place])    right index finger  . Gangrene of foot ([place])   . Glaucoma   . Glaucoma 03/07/2014  . Heart murmur   . History of MRSA infection 04/22/2013   Bacteremia assoc w L foot wound infection Mar 2015   . Hyperparathyroidism, secondary renal ([place])   . Hypertension   . Hypothyroidism   . MRSA bacteremia   . Multiple myeloma, without mention of having achieved remission 03/30/2012   Cytogenetic neg on 03/23/2012.  Marland Kitchen Necrosis ([place])    left long finger  . Peripheral arterial disease ([place])   . Peripheral vascular disease, unspecified ([place]) 11/19/2012   In the past had R foot toe amps then R TMA. In 2015 had left foot toe amp > then TMA >then L BKA on 05/14/13   . Pneumonia    2012  . PONV (postoperative nausea and vomiting)   . Thyroid disease    hyperparathyroidism    Past Surgical History:  Procedure Laterality Date  . ABDOMINAL AORTAGRAM Bilateral 11/06/2012   Procedure: ABDOMINAL AORTAGRAM;  Surgeon: Elam Dutch, MD;  Location: [facility] CATH LAB;  Service: Cardiovascular;  Laterality: Bilateral;  . ABDOMINAL AORTOGRAM N/A 07/11/2017   Procedure: ABDOMINAL AORTOGRAM;  Surgeon: Elam Dutch, MD;  Location: [place] CV LAB;  Service: Cardiovascular;  Laterality: N/A;  . AMPUTATION Right 11/10/2012   Procedure: AMPUTATION FIRST and SECOND TOES Right Foot;  Surgeon: Elam Dutch, MD;  Location: [place];  Service: Vascular;  Laterality: Right;  . AMPUTATION Left 04/07/2013   Procedure: AMPUTATION DIGIT- LEFT 1ST TOE;  Surgeon: Mal Misty, MD;  Location: [place];  Service: Vascular;  Laterality: Left;  . AMPUTATION Left 04/26/2013   Procedure: Left Foot Transmetatarsal Amputation;  Surgeon: Newt Minion, MD;  Location: [place];  Service: Orthopedics;  Laterality: Left;  . AMPUTATION Left 05/14/2013   Procedure: AMPUTATION BELOW KNEE;  Surgeon: Newt Minion, MD;  Location: [place];  Service: Orthopedics;  Laterality: Left;  Left Below Knee Amputation  . AMPUTATION Left 07/23/2013   Procedure: AMPUTATION BELOW KNEE;  Surgeon: Newt Minion, MD;  Location: [place];  Service: Orthopedics;  Laterality: Left;  Left Below Knee Amputation Revision  . AMPUTATION Right 02/18/2014   Procedure: AMPUTATION BELOW KNEE;  Surgeon: Newt Minion, MD;  Location: [place];  Service: Orthopedics;  Laterality: Right;  . AMPUTATION Right 08/20/2016   Procedure: RIGHT LONG FINGER AMPUTATION;  Surgeon: Leanora Cover, MD;  Location: [place];  Service: Orthopedics;  Laterality: Right;  . AMPUTATION Right 01/11/2017   Procedure: RIGHT LONG FINGER AMPUTATION;  Surgeon: Leanora Cover, MD;  Location: [place];  Service: Orthopedics;  Laterality: Right;  . AMPUTATION Left 04/04/2017   Procedure: AMPUTATION DIGIT LEFT LONG FINGER;  Surgeon: Leanora Cover, MD;  Location: [place];  Service: Orthopedics;  Laterality: Left;  . AMPUTATION Right 06/13/2017   Procedure: INDEX FINGER RIGHT AMPUTATION;  Surgeon: Leanora Cover, MD;  Location: [place];  Service: Orthopedics;  Laterality: Right;  . AORTIC ARCH ANGIOGRAPHY N/A 07/11/2017   Procedure: AORTIC ARCH ANGIOGRAPHY;  Surgeon: Elam Dutch, MD;  Location: [place] CV LAB;  Service: Cardiovascular;  Laterality: N/A;  . AV FISTULA PLACEMENT Left 03/25/2012   Procedure: ARTERIOVENOUS (AV) FISTULA CREATION;  Surgeon: Mal Misty, MD;  Location: [place];  Service: Vascular;  Laterality: Left;  . BELOW KNEE LEG AMPUTATION Left 05/14/2013   DR [name]  . BELOW KNEE LEG AMPUTATION Right 02/18/2014   dr duda  . CARDIAC CATHETERIZATION     approx 30 years ago  . CERVICAL DISC SURGERY    . EYE SURGERY Bilateral    CATARACTS  . I&D EXTREMITY Left 04/22/2013   Procedure: IRRIGATION AND DEBRIDEMENT LEFT FIRST TOE AMPUTATION WOUND ;  Surgeon: Mal Misty, MD;  Location: [place];  Service: Vascular;  Laterality: Left;  . I&D EXTREMITY Right 01/05/2014   Procedure: IRRIGATION AND DEBRIDEMENT Right Heel Ulcer;  Surgeon: Mcarthur Rossetti, MD;  Location: [place];  Service: Orthopedics;  Laterality: Right;  Surgeon Available after 5PM  . INSERTION OF DIALYSIS CATHETER Right 03/19/2012   Procedure: INSERTION OF DIALYSIS CATHETER;  Surgeon: Mal Misty, MD;  Location: [place];  Service: Vascular;  Laterality: Right;  Right Internal Jugular  . LIGATION OF COMPETING BRANCHES OF ARTERIOVENOUS FISTULA Left 05/08/2012   Procedure: LIGATION OF COMPETING BRANCHES OF ARTERIOVENOUS FISTULA;  Surgeon: Mal Misty, MD;  Location: [place];  Service: Vascular;  Laterality: Left;  Ultrasound guided  . REPAIR QUADRICEPS/HAMSTRING MUSCLES Left 05/20/2014   Procedure: Left Hamstring Release;  Surgeon: Newt Minion, MD;  Location: [place];  Service: Orthopedics;  Laterality: Left;  . REVISON OF ARTERIOVENOUS FISTULA Left 01/30/2016   Procedure: CEPHALIC VEIN TURNDOWN TO LEFT UPPER ARM;  Surgeon: Elam Dutch, MD;  Location: [place];  Service: Vascular;  Laterality: Left;  . STUMP REVISION Right 05/20/2014    Procedure: Revision Right Below Knee Amputation;  Surgeon: Newt Minion, MD;  Location: [place];  Service: Orthopedics;  Laterality: Right;  . TEE WITHOUT CARDIOVERSION N/A 04/20/2013   Procedure: TRANSESOPHAGEAL ECHOCARDIOGRAM (TEE);  Surgeon: Josue Hector, MD;  Location: [facility] ENDOSCOPY;  Service: Cardiovascular;  Laterality: N/A;  . THYROIDECTOMY    . TOE AMPUTATION     D/C 04-30-13  . TRANSMETATARSAL AMPUTATION Left 12/16/2012   Procedure: TRANSMETATARSAL AMPUTATION AND VAC PLACEMENT;  Surgeon: Elam Dutch, MD;  Location: [place];  Service: Vascular;  Laterality: Left;  . UPPER EXTREMITY ANGIOGRAPHY Right 07/11/2017   Procedure: UPPER EXTREMITY ANGIOGRAPHY;  Surgeon: Elam Dutch, MD;  Location: [place] CV LAB;  Service: Cardiovascular;  Laterality: Right;    Family History: Family History  Problem Relation Age of Onset  . Diabetes Mother   . Cancer Mother  bone   . Kidney disease Father     Social History:   reports that he quit smoking about 35 years ago. His smoking use included cigarettes. He has never used smokeless tobacco. He reports that he does not drink alcohol or use drugs.  Medications: Medications Prior to Admission  Medication Sig Dispense Refill  . aspirin EC 81 MG tablet Take 81 mg by mouth daily.    Marland Kitchen atorvastatin (LIPITOR) 10 MG tablet TAKE 1 TABLET (10 MG TOTAL) BY MOUTH DAILY. 90 tablet 1  . Continuous Blood Gluc Sensor (FREESTYLE LIBRE 14 DAY SENSOR) MISC USE DAILY*NOT COVERED* 1 each 1  . Continuous Blood Gluc Sensor (FREESTYLE LIBRE 14 DAY SENSOR) MISC USE DAILY*NOT COVERED* 2 each 11  . lanthanum (FOSRENOL) 1000 MG chewable tablet Chew 1,000 mg by mouth daily.   6  . levothyroxine (SYNTHROID, LEVOTHROID) 150 MCG tablet TAKE 1/2 TABLET BY MOUTH ON MONDAYS AND 1 TABLET BY MOUTH THE REST OF THE WEEK. 90 tablet 1  . multivitamin (RENA-VIT) TABS tablet Take 1 tablet by mouth 4 (four) times a week.     . naproxen sodium (ALEVE) 220 MG tablet Take 220  mg by mouth daily as needed (pain).    Marland Kitchen oxyCODONE-acetaminophen (PERCOCET) 5-325 MG tablet 1-2 tabs po q6 hours prn pain (Patient taking differently: Take 1-2 tablets by mouth every 6 (six) hours as needed for moderate pain or severe pain. 1-2 tabs po q6 hours prn pain) 20 tablet 0    No results found for this or any previous visit (from the past 48 hour(s)).  No results found.   A comprehensive review of systems was negative.  There were no vitals taken for this visit.  General appearance: alert, cooperative and appears stated age Head: Normocephalic, without obvious abnormality, atraumatic Neck: supple, symmetrical, trachea midline Cardio: regular rate and rhythm Resp: clear to auscultation bilaterally Extremities: Intact sensation and capillary refill all digits except left ring finger.  +epl/fpl/io.  Necrosis of left ring finger. Pulses: 2+ and symmetric Skin: Skin color, texture, turgor normal. No rashes or lesions Neurologic: Grossly normal Incision/Wound: as above  Assessment/Plan Left ring finger necrosis and adjacent long finger amputation wound.  Plan amputation left ring finger through MP joint with debridement of adjacent wound as necessary.  Risks, benefits, and alternatives of surgery were discussed and the patient agrees with the plan of care.   Ameer Sanden R 07/29/2017, 1:34 PM

## 2017-07-29 NOTE — Anesthesia Preprocedure Evaluation (Addendum)
Anesthesia Evaluation  Patient identified by MRN, date of birth, ID band Patient awake    Reviewed: Allergy & Precautions, NPO status , Patient's Chart, lab work & pertinent test results  History of Anesthesia Complications (+) PONV and history of anesthetic complications  Airway Mallampati: II  TM Distance: >3 FB Neck ROM: Full    Dental  (+) Dental Advisory Given   Pulmonary asthma , former smoker,    breath sounds clear to auscultation       Cardiovascular hypertension, + Peripheral Vascular Disease (s/p BKA)  + Valvular Problems/Murmurs  Rhythm:Regular Rate:Normal  '16 ECHO: EF 60-65%, valves OK   Neuro/Psych  Neuromuscular disease negative psych ROS   GI/Hepatic negative GI ROS, Neg liver ROS,   Endo/Other  diabetes, Type 2, Oral Hypoglycemic AgentsHypothyroidism   Renal/GU ESRF and DialysisRenal disease (MWF; last dialysis Monday)     Musculoskeletal  (+) Arthritis ,   Abdominal   Peds  Hematology  (+) Blood dyscrasia (Hb 9.4), , Multiple myeloma    Anesthesia Other Findings Day of surgery medications reviewed with the patient.  Reproductive/Obstetrics                            Lab Results  Component Value Date   WBC 7.0 04/04/2017   HGB 13.9 07/11/2017   HCT 41.0 07/11/2017   MCV 87.1 04/04/2017   PLT 366 04/04/2017   Lab Results  Component Value Date   CREATININE 5.60 (H) 07/11/2017   BUN 28 (H) 07/11/2017   NA 142 07/11/2017   K 3.7 07/15/2017   CL 101 07/11/2017   CO2 24 04/04/2017    Anesthesia Physical  Anesthesia Plan  ASA: III  Anesthesia Plan: General   Post-op Pain Management:    Induction: Intravenous  PONV Risk Score and Plan: 3 and Dexamethasone, Ondansetron and Treatment may vary due to age or medical condition  Airway Management Planned: LMA  Additional Equipment:   Intra-op Plan:   Post-operative Plan: Extubation in OR  Informed  Consent: I have reviewed the patients History and Physical, chart, labs and discussed the procedure including the risks, benefits and alternatives for the proposed anesthesia with the patient or authorized representative who has indicated his/her understanding and acceptance.   Dental advisory given  Plan Discussed with: CRNA  Anesthesia Plan Comments:         Anesthesia Quick Evaluation

## 2017-07-29 NOTE — Anesthesia Procedure Notes (Signed)
Procedure Name: LMA Insertion Date/Time: 07/29/2017 3:04 PM Performed by: Eulas Post, Oliver Heitzenrater W, CRNA Pre-anesthesia Checklist: Patient identified, Emergency Drugs available, Suction available and Patient being monitored Patient Re-evaluated:Patient Re-evaluated prior to induction Oxygen Delivery Method: Circle system utilized Preoxygenation: Pre-oxygenation with 100% oxygen Induction Type: IV induction Ventilation: Mask ventilation without difficulty LMA: LMA inserted LMA Size: 4.0 Number of attempts: 1 Airway Equipment and Method: Oral airway Placement Confirmation: positive ETCO2 and breath sounds checked- equal and bilateral Tube secured with: Tape Dental Injury: Teeth and Oropharynx as per pre-operative assessment

## 2017-07-29 NOTE — Op Note (Signed)
I assisted Surgeon(s) and Role:    * Leanora Cover, MD - Primary    Daryll Brod, MD - Assisting on the Procedure(s): LEFT [place] on 07/29/2017.  I provided assistance on this case as follows: setup, amputation, closure and application of the dressing. Electronically signed by: Wynonia Sours, MD Date: 07/29/2017 Time: 4:07 PM

## 2017-07-29 NOTE — Op Note (Signed)
NAME: Jack [name] MEDICAL RECORD NO: [id number] DATE OF BIRTH: 1945-05-10 FACILITY: [facility] LOCATION: [facility] PHYSICIAN: Tennis Must, MD   OPERATIVE REPORT   DATE OF PROCEDURE: 07/29/17    PREOPERATIVE DIAGNOSIS:   Left ring finger necrosis   POSTOPERATIVE DIAGNOSIS:   Left ring finger necrosis   PROCEDURE:   Left ring finger amputation through MP joint   SURGEON:  Leanora Cover, M.D.   ASSISTANT: none Daryll Brod, MD   ANESTHESIA:  General   INTRAVENOUS FLUIDS:  Per anesthesia flow sheet.   ESTIMATED BLOOD LOSS:  Minimal.   COMPLICATIONS:  None.   SPECIMENS:   Left ring finger to pathology for gross examination   TOURNIQUET TIME:    Total Tourniquet Time Documented: Forearm (Right) - 48 minutes Total: Forearm (Right) - 48 minutes    DISPOSITION:  Stable to PACU.   INDICATIONS: 72 year old male with necrosis of left ring finger.  He has had necrosis of previous digits requiring amputation at the level of the MP joint.  He wishes to undergo left ring finger amputation at the MP joint. Risks, benefits and alternatives of surgery were discussed including the risks of blood loss, infection, damage to nerves, vessels, tendons, ligaments, bone for surgery, need for additional surgery, complications with wound healing, continued pain, nonunion, malunion, stiffness.  He voiced understanding of these risks and elected to proceed.  OPERATIVE COURSE:  After being identified preoperatively by myself,  the patient and I agreed on the procedure and site of the procedure.  The surgical site was marked.  Surgical consent had been signed. He was given IV antibiotics as preoperative antibiotic prophylaxis. He was transferred to the operating room and placed on the operating table in supine position with the left upper extremity on an arm board.  General anesthesia was induced by the anesthesiologist.  Left upper extremity was prepped and draped in normal sterile  orthopedic fashion.  A surgical pause was performed between the surgeons, anesthesia, and operating room staff and all were in agreement as to the patient, procedure, and site of procedure.  Tourniquet at the proximal aspect of the forearm was inflated to 250 mmHg after exsanguination of the arm with an Esmarch bandage.    Incision was made starting dorsally on the ring finger and carried in subtenons tissues by spreading technique.  Bipolar trocar was used to obtain hemostasis.  The digital neurovascular bundles were identified in both the radial and ulnar sides.  The digital nerve was placed under traction and bipolar and allowed to retract.  The digital arteries were tied off with 4-0 Vicryl suture and then bipolar and transected and allowed to retract.  The digit was amputated through the MP joint.  Bipolar trochars used to obtain hemostasis.  Wound was copiously irrigated with sterile saline.  5-0 Monocryl suture was used in a horizontal mattress fashion to reapproximate the skin edges.  Good tension-free reapproximation was obtained.  The area was injected with quarter percent plain Marcaine to aid in postoperative analgesia.  The digit was sent to pathology for gross examination only.  The wound of the previous long finger amputation was explored at the start of the case.  There was scab over top.  There was good healing underneath with some small areas that had not completely re-epithelialized.  There appeared to be good blood flow.  No signs of infection.  Wounds are dressed with sterile Xeroform 4 x 4's and wrapped with a Kerlix dressing.  This  was then wrapped with a Coban dressing lightly.  The tourniquet was deflated at 48 minutes.  Fingertips were pink with brisk capillary refill after deflation of tourniquet.  The operative  drapes were broken down.  The patient was awoken from anesthesia safely.  He was transferred back to the stretcher and taken to PACU in stable condition.  I will see him back in  the office in 1 week for postoperative followup.  I will give him a prescription for Oxycodone 5 mill grams 1-2 p.o. every 6 hours as needed pain dispense #20 and Zofran 4 mill grams p.o. every 8 hours as needed nausea dispense 20.   Tennis Must, MD Electronically signed, 07/29/17

## 2017-07-29 NOTE — Discharge Instructions (Addendum)

## 2017-07-30 ENCOUNTER — Encounter (HOSPITAL_BASED_OUTPATIENT_CLINIC_OR_DEPARTMENT_OTHER): Payer: Self-pay | Admitting: Orthopedic Surgery

## 2017-07-30 NOTE — Anesthesia Postprocedure Evaluation (Signed)
Anesthesia Post Note  Patient: Bryson Gavia  Procedure(s) Performed: LEFT RING FINGER AMPUTATION (Left Ring Finger) DEBRIDEMENT WOUND (Left Ring Finger)     Patient location during evaluation: PACU Anesthesia Type: General Level of consciousness: awake and alert Pain management: pain level controlled Vital Signs Assessment: post-procedure vital signs reviewed and stable Respiratory status: spontaneous breathing, nonlabored ventilation and respiratory function stable Cardiovascular status: blood pressure returned to baseline and stable Postop Assessment: no apparent nausea or vomiting Anesthetic complications: no    Last Vitals:  Vitals:   07/29/17 1700 07/29/17 1715  BP: 127/81 121/73  Pulse:  72  Resp: 13 14  Temp:  36.6 C  SpO2:  99%    Last Pain:  Vitals:   07/29/17 1715  TempSrc:   PainSc: 2                  Catalina Gravel

## 2017-08-01 ENCOUNTER — Emergency Department (HOSPITAL_COMMUNITY): Payer: Medicare Other

## 2017-08-01 ENCOUNTER — Encounter (HOSPITAL_COMMUNITY): Payer: Self-pay | Admitting: Emergency Medicine

## 2017-08-01 ENCOUNTER — Inpatient Hospital Stay (HOSPITAL_COMMUNITY)
Admission: EM | Admit: 2017-08-01 | Discharge: 2017-08-09 | DRG: 987 | Disposition: A | Discharge status: Skilled Nursing Facility | Payer: Medicare Other | Attending: Internal Medicine | Admitting: Internal Medicine

## 2017-08-01 DIAGNOSIS — I120 Hypertensive chronic kidney disease with stage 5 chronic kidney disease or end stage renal disease: Secondary | ICD-10-CM | POA: Diagnosis not present

## 2017-08-01 DIAGNOSIS — R188 Other ascites: Secondary | ICD-10-CM | POA: Diagnosis present

## 2017-08-01 DIAGNOSIS — T82598A Other mechanical complication of other cardiac and vascular devices and implants, initial encounter: Secondary | ICD-10-CM | POA: Diagnosis not present

## 2017-08-01 DIAGNOSIS — E1152 Type 2 diabetes mellitus with diabetic peripheral angiopathy with gangrene: Secondary | ICD-10-CM | POA: Diagnosis present

## 2017-08-01 DIAGNOSIS — Z66 Do not resuscitate: Secondary | ICD-10-CM | POA: Diagnosis not present

## 2017-08-01 DIAGNOSIS — D509 Iron deficiency anemia, unspecified: Secondary | ICD-10-CM | POA: Diagnosis not present

## 2017-08-01 DIAGNOSIS — C9001 Multiple myeloma in remission: Secondary | ICD-10-CM | POA: Diagnosis present

## 2017-08-01 DIAGNOSIS — Z87891 Personal history of nicotine dependence: Secondary | ICD-10-CM

## 2017-08-01 DIAGNOSIS — Z888 Allergy status to other drugs, medicaments and biological substances status: Secondary | ICD-10-CM

## 2017-08-01 DIAGNOSIS — K922 Gastrointestinal hemorrhage, unspecified: Secondary | ICD-10-CM | POA: Diagnosis present

## 2017-08-01 DIAGNOSIS — R52 Pain, unspecified: Secondary | ICD-10-CM

## 2017-08-01 DIAGNOSIS — D631 Anemia in chronic kidney disease: Secondary | ICD-10-CM | POA: Diagnosis not present

## 2017-08-01 DIAGNOSIS — Z833 Family history of diabetes mellitus: Secondary | ICD-10-CM

## 2017-08-01 DIAGNOSIS — C7800 Secondary malignant neoplasm of unspecified lung: Secondary | ICD-10-CM | POA: Diagnosis not present

## 2017-08-01 DIAGNOSIS — C787 Secondary malignant neoplasm of liver and intrahepatic bile duct: Secondary | ICD-10-CM | POA: Diagnosis present

## 2017-08-01 DIAGNOSIS — K6389 Other specified diseases of intestine: Secondary | ICD-10-CM | POA: Diagnosis not present

## 2017-08-01 DIAGNOSIS — R072 Precordial pain: Secondary | ICD-10-CM

## 2017-08-01 DIAGNOSIS — M869 Osteomyelitis, unspecified: Secondary | ICD-10-CM | POA: Diagnosis present

## 2017-08-01 DIAGNOSIS — Z91030 Bee allergy status: Secondary | ICD-10-CM

## 2017-08-01 DIAGNOSIS — I10 Essential (primary) hypertension: Secondary | ICD-10-CM | POA: Diagnosis present

## 2017-08-01 DIAGNOSIS — Z7982 Long term (current) use of aspirin: Secondary | ICD-10-CM

## 2017-08-01 DIAGNOSIS — Z89512 Acquired absence of left leg below knee: Secondary | ICD-10-CM | POA: Diagnosis not present

## 2017-08-01 DIAGNOSIS — Z808 Family history of malignant neoplasm of other organs or systems: Secondary | ICD-10-CM

## 2017-08-01 DIAGNOSIS — IMO0002 Reserved for concepts with insufficient information to code with codable children: Secondary | ICD-10-CM

## 2017-08-01 DIAGNOSIS — E890 Postprocedural hypothyroidism: Secondary | ICD-10-CM | POA: Diagnosis present

## 2017-08-01 DIAGNOSIS — R109 Unspecified abdominal pain: Secondary | ICD-10-CM | POA: Diagnosis not present

## 2017-08-01 DIAGNOSIS — R079 Chest pain, unspecified: Secondary | ICD-10-CM | POA: Diagnosis not present

## 2017-08-01 DIAGNOSIS — I739 Peripheral vascular disease, unspecified: Secondary | ICD-10-CM | POA: Diagnosis not present

## 2017-08-01 DIAGNOSIS — N2581 Secondary hyperparathyroidism of renal origin: Secondary | ICD-10-CM | POA: Diagnosis not present

## 2017-08-01 DIAGNOSIS — C786 Secondary malignant neoplasm of retroperitoneum and peritoneum: Secondary | ICD-10-CM | POA: Diagnosis not present

## 2017-08-01 DIAGNOSIS — R110 Nausea: Secondary | ICD-10-CM | POA: Diagnosis not present

## 2017-08-01 DIAGNOSIS — K3189 Other diseases of stomach and duodenum: Secondary | ICD-10-CM | POA: Diagnosis present

## 2017-08-01 DIAGNOSIS — Z841 Family history of disorders of kidney and ureter: Secondary | ICD-10-CM

## 2017-08-01 DIAGNOSIS — R1114 Bilious vomiting: Secondary | ICD-10-CM | POA: Diagnosis not present

## 2017-08-01 DIAGNOSIS — N186 End stage renal disease: Secondary | ICD-10-CM | POA: Diagnosis present

## 2017-08-01 DIAGNOSIS — R1111 Vomiting without nausea: Secondary | ICD-10-CM | POA: Diagnosis not present

## 2017-08-01 DIAGNOSIS — Z8719 Personal history of other diseases of the digestive system: Secondary | ICD-10-CM

## 2017-08-01 DIAGNOSIS — E8889 Other specified metabolic disorders: Secondary | ICD-10-CM | POA: Diagnosis present

## 2017-08-01 DIAGNOSIS — D62 Acute posthemorrhagic anemia: Secondary | ICD-10-CM | POA: Diagnosis present

## 2017-08-01 DIAGNOSIS — E1122 Type 2 diabetes mellitus with diabetic chronic kidney disease: Secondary | ICD-10-CM | POA: Diagnosis not present

## 2017-08-01 DIAGNOSIS — Z89511 Acquired absence of right leg below knee: Secondary | ICD-10-CM | POA: Diagnosis not present

## 2017-08-01 DIAGNOSIS — C494 Malignant neoplasm of connective and soft tissue of abdomen: Secondary | ICD-10-CM | POA: Diagnosis not present

## 2017-08-01 DIAGNOSIS — C189 Malignant neoplasm of colon, unspecified: Secondary | ICD-10-CM | POA: Diagnosis not present

## 2017-08-01 DIAGNOSIS — R1904 Left lower quadrant abdominal swelling, mass and lump: Secondary | ICD-10-CM | POA: Diagnosis not present

## 2017-08-01 DIAGNOSIS — Z9842 Cataract extraction status, left eye: Secondary | ICD-10-CM

## 2017-08-01 DIAGNOSIS — Z993 Dependence on wheelchair: Secondary | ICD-10-CM

## 2017-08-01 DIAGNOSIS — Z6822 Body mass index (BMI) 22.0-22.9, adult: Secondary | ICD-10-CM

## 2017-08-01 DIAGNOSIS — E1139 Type 2 diabetes mellitus with other diabetic ophthalmic complication: Secondary | ICD-10-CM | POA: Diagnosis present

## 2017-08-01 DIAGNOSIS — R933 Abnormal findings on diagnostic imaging of other parts of digestive tract: Secondary | ICD-10-CM | POA: Diagnosis not present

## 2017-08-01 DIAGNOSIS — C7802 Secondary malignant neoplasm of left lung: Secondary | ICD-10-CM | POA: Diagnosis present

## 2017-08-01 DIAGNOSIS — R58 Hemorrhage, not elsewhere classified: Secondary | ICD-10-CM | POA: Diagnosis not present

## 2017-08-01 DIAGNOSIS — K920 Hematemesis: Secondary | ICD-10-CM | POA: Diagnosis present

## 2017-08-01 DIAGNOSIS — Z515 Encounter for palliative care: Secondary | ICD-10-CM | POA: Diagnosis not present

## 2017-08-01 DIAGNOSIS — Z7401 Bed confinement status: Secondary | ICD-10-CM | POA: Diagnosis not present

## 2017-08-01 DIAGNOSIS — Z7189 Other specified counseling: Secondary | ICD-10-CM

## 2017-08-01 DIAGNOSIS — E440 Moderate protein-calorie malnutrition: Secondary | ICD-10-CM | POA: Diagnosis present

## 2017-08-01 DIAGNOSIS — Z89611 Acquired absence of right leg above knee: Secondary | ICD-10-CM | POA: Diagnosis not present

## 2017-08-01 DIAGNOSIS — I1311 Hypertensive heart and chronic kidney disease without heart failure, with stage 5 chronic kidney disease, or end stage renal disease: Secondary | ICD-10-CM | POA: Diagnosis present

## 2017-08-01 DIAGNOSIS — M2550 Pain in unspecified joint: Secondary | ICD-10-CM | POA: Diagnosis not present

## 2017-08-01 DIAGNOSIS — I440 Atrioventricular block, first degree: Secondary | ICD-10-CM | POA: Diagnosis present

## 2017-08-01 DIAGNOSIS — G546 Phantom limb syndrome with pain: Secondary | ICD-10-CM | POA: Diagnosis present

## 2017-08-01 DIAGNOSIS — R112 Nausea with vomiting, unspecified: Secondary | ICD-10-CM | POA: Diagnosis not present

## 2017-08-01 DIAGNOSIS — D689 Coagulation defect, unspecified: Secondary | ICD-10-CM | POA: Diagnosis not present

## 2017-08-01 DIAGNOSIS — R0789 Other chest pain: Secondary | ICD-10-CM | POA: Diagnosis not present

## 2017-08-01 DIAGNOSIS — C182 Malignant neoplasm of ascending colon: Secondary | ICD-10-CM | POA: Diagnosis present

## 2017-08-01 DIAGNOSIS — D649 Anemia, unspecified: Secondary | ICD-10-CM | POA: Diagnosis not present

## 2017-08-01 DIAGNOSIS — E11649 Type 2 diabetes mellitus with hypoglycemia without coma: Secondary | ICD-10-CM | POA: Diagnosis not present

## 2017-08-01 DIAGNOSIS — Z992 Dependence on renal dialysis: Secondary | ICD-10-CM | POA: Diagnosis not present

## 2017-08-01 DIAGNOSIS — I96 Gangrene, not elsewhere classified: Secondary | ICD-10-CM | POA: Diagnosis present

## 2017-08-01 DIAGNOSIS — Z9181 History of falling: Secondary | ICD-10-CM

## 2017-08-01 DIAGNOSIS — J45909 Unspecified asthma, uncomplicated: Secondary | ICD-10-CM | POA: Diagnosis present

## 2017-08-01 DIAGNOSIS — Z79899 Other long term (current) drug therapy: Secondary | ICD-10-CM

## 2017-08-01 DIAGNOSIS — Z89022 Acquired absence of left finger(s): Secondary | ICD-10-CM

## 2017-08-01 DIAGNOSIS — R1084 Generalized abdominal pain: Secondary | ICD-10-CM | POA: Diagnosis not present

## 2017-08-01 DIAGNOSIS — E119 Type 2 diabetes mellitus without complications: Secondary | ICD-10-CM | POA: Diagnosis not present

## 2017-08-01 DIAGNOSIS — Z79890 Hormone replacement therapy (postmenopausal): Secondary | ICD-10-CM

## 2017-08-01 DIAGNOSIS — Z8614 Personal history of Methicillin resistant Staphylococcus aureus infection: Secondary | ICD-10-CM

## 2017-08-01 DIAGNOSIS — Z885 Allergy status to narcotic agent status: Secondary | ICD-10-CM

## 2017-08-01 DIAGNOSIS — H42 Glaucoma in diseases classified elsewhere: Secondary | ICD-10-CM | POA: Diagnosis present

## 2017-08-01 DIAGNOSIS — C801 Malignant (primary) neoplasm, unspecified: Secondary | ICD-10-CM | POA: Diagnosis not present

## 2017-08-01 DIAGNOSIS — K5909 Other constipation: Secondary | ICD-10-CM | POA: Diagnosis present

## 2017-08-01 DIAGNOSIS — N189 Chronic kidney disease, unspecified: Secondary | ICD-10-CM

## 2017-08-01 DIAGNOSIS — Z79891 Long term (current) use of opiate analgesic: Secondary | ICD-10-CM

## 2017-08-01 DIAGNOSIS — E039 Hypothyroidism, unspecified: Secondary | ICD-10-CM | POA: Diagnosis not present

## 2017-08-01 DIAGNOSIS — Z800 Family history of malignant neoplasm of digestive organs: Secondary | ICD-10-CM

## 2017-08-01 DIAGNOSIS — C7801 Secondary malignant neoplasm of right lung: Secondary | ICD-10-CM | POA: Diagnosis present

## 2017-08-01 DIAGNOSIS — E1029 Type 1 diabetes mellitus with other diabetic kidney complication: Secondary | ICD-10-CM | POA: Diagnosis not present

## 2017-08-01 DIAGNOSIS — I878 Other specified disorders of veins: Secondary | ICD-10-CM | POA: Diagnosis present

## 2017-08-01 DIAGNOSIS — Z89612 Acquired absence of left leg above knee: Secondary | ICD-10-CM | POA: Diagnosis not present

## 2017-08-01 DIAGNOSIS — Z89021 Acquired absence of right finger(s): Secondary | ICD-10-CM

## 2017-08-01 DIAGNOSIS — Z9841 Cataract extraction status, right eye: Secondary | ICD-10-CM

## 2017-08-01 DIAGNOSIS — H409 Unspecified glaucoma: Secondary | ICD-10-CM | POA: Diagnosis present

## 2017-08-01 DIAGNOSIS — Z91041 Radiographic dye allergy status: Secondary | ICD-10-CM

## 2017-08-01 LAB — CBC
HCT: 43.4 % (ref 39.0–52.0)
Hemoglobin: 12.6 g/dL — ABNORMAL LOW (ref 13.0–17.0)
MCH: 24.8 pg — AB (ref 26.0–34.0)
MCHC: 29 g/dL — ABNORMAL LOW (ref 30.0–36.0)
MCV: 85.3 fL (ref 78.0–100.0)
PLATELETS: 352 10*3/uL (ref 150–400)
RBC: 5.09 MIL/uL (ref 4.22–5.81)
RDW: 17.2 % — ABNORMAL HIGH (ref 11.5–15.5)
WBC: 9.1 10*3/uL (ref 4.0–10.5)

## 2017-08-01 LAB — I-STAT TROPONIN, ED: Troponin i, poc: 0.04 ng/mL (ref 0.00–0.08)

## 2017-08-01 LAB — BASIC METABOLIC PANEL
Anion gap: 17 — ABNORMAL HIGH (ref 5–15)
BUN: 27 mg/dL — ABNORMAL HIGH (ref 8–23)
CHLORIDE: 99 mmol/L (ref 98–111)
CO2: 28 mmol/L (ref 22–32)
CREATININE: 6.64 mg/dL — AB (ref 0.61–1.24)
Calcium: 8.7 mg/dL — ABNORMAL LOW (ref 8.9–10.3)
GFR calc non Af Amer: 7 mL/min — ABNORMAL LOW (ref >60)
GFR, EST AFRICAN AMERICAN: 8 mL/min — AB (ref >60)
Glucose, Bld: 140 mg/dL — ABNORMAL HIGH (ref 70–99)
POTASSIUM: 4.1 mmol/L (ref 3.5–5.1)
Sodium: 144 mmol/L (ref 135–145)

## 2017-08-01 LAB — HEPATIC FUNCTION PANEL
ALBUMIN: 2.9 g/dL — AB (ref 3.5–5.0)
ALK PHOS: 131 U/L — AB (ref 38–126)
ALT: 6 U/L (ref 0–44)
AST: 21 U/L (ref 15–41)
BILIRUBIN TOTAL: 0.9 mg/dL (ref 0.3–1.2)
Bilirubin, Direct: 0.2 mg/dL (ref 0.0–0.2)
Indirect Bilirubin: 0.7 mg/dL (ref 0.3–0.9)
TOTAL PROTEIN: 7.8 g/dL (ref 6.5–8.1)

## 2017-08-01 LAB — MRSA PCR SCREENING: MRSA BY PCR: NEGATIVE

## 2017-08-01 LAB — LIPASE, BLOOD: LIPASE: 23 U/L (ref 11–51)

## 2017-08-01 MED ORDER — INSULIN ASPART 100 UNIT/ML ~~LOC~~ SOLN: 0.0000 [IU] | SUBCUTANEOUS | Status: DC

## 2017-08-01 MED ORDER — PIPERACILLIN-TAZOBACTAM 3.375 G IVPB 30 MIN
3.3750 g | Freq: Once | INTRAVENOUS | Status: AC
  Administered 2017-08-01: 3.375 g via INTRAVENOUS
  Filled 2017-08-01: qty 50

## 2017-08-01 MED ORDER — FAMOTIDINE 20 MG PO TABS
20.0000 mg | ORAL_TABLET | Freq: Once | ORAL | Status: AC
  Administered 2017-08-01: 20 mg via ORAL
  Filled 2017-08-01: qty 1

## 2017-08-01 MED ORDER — ONDANSETRON HCL 4 MG/2ML IJ SOLN
4.0000 mg | Freq: Once | INTRAMUSCULAR | Status: AC
  Administered 2017-08-01: 4 mg via INTRAVENOUS
  Filled 2017-08-01: qty 2

## 2017-08-01 MED ORDER — FAMOTIDINE IN NACL 20-0.9 MG/50ML-% IV SOLN
20.0000 mg | Freq: Two times a day (BID) | INTRAVENOUS | Status: DC
  Administered 2017-08-01 – 2017-08-04 (×6): 20 mg via INTRAVENOUS
  Filled 2017-08-01 (×6): qty 50

## 2017-08-01 MED ORDER — ALUM & MAG HYDROXIDE-SIMETH 200-200-20 MG/5ML PO SUSP
15.0000 mL | Freq: Once | ORAL | Status: AC
  Administered 2017-08-01: 15 mL via ORAL
  Filled 2017-08-01: qty 30

## 2017-08-01 MED ORDER — METOCLOPRAMIDE HCL 5 MG/ML IJ SOLN
10.0000 mg | Freq: Once | INTRAMUSCULAR | Status: AC
  Administered 2017-08-01: 10 mg via INTRAVENOUS
  Filled 2017-08-01: qty 2

## 2017-08-01 MED ORDER — PIPERACILLIN-TAZOBACTAM 3.375 G IVPB
3.3750 g | Freq: Two times a day (BID) | INTRAVENOUS | Status: DC
  Administered 2017-08-02 – 2017-08-09 (×15): 3.375 g via INTRAVENOUS
  Filled 2017-08-01 (×16): qty 50

## 2017-08-01 MED ORDER — ONDANSETRON HCL 4 MG/2ML IJ SOLN
4.0000 mg | Freq: Four times a day (QID) | INTRAMUSCULAR | Status: DC | PRN
  Administered 2017-08-01 – 2017-08-05 (×2): 4 mg via INTRAVENOUS
  Filled 2017-08-01 (×2): qty 2

## 2017-08-01 MED ORDER — LEVOTHYROXINE SODIUM 100 MCG IV SOLR
75.0000 ug | Freq: Every day | INTRAVENOUS | Status: DC
  Administered 2017-08-02 – 2017-08-08 (×7): 75 ug via INTRAVENOUS
  Filled 2017-08-01 (×7): qty 5

## 2017-08-01 NOTE — ED Notes (Signed)
Patient transported to CT 

## 2017-08-01 NOTE — Consult Note (Addendum)
Jack [name] 1945/03/18  [id number].    Requesting MD: gastric pneumatosis  Chief Complaint/Reason for Consult: Dr. Lajean Saver  HPI:  This is a very pleasant, but medically complex 72 yo black male with a history of DM, HTN, MM, ESRD on HD, horrible PVD/PAD s/p bilateral BKAs as well as multiple recent finger amputations secondary to gangrene and infection.  Over the last several weeks, he has had multiple digits on both hands amputated.  Anesthesia makes the patient very nauseated.  He has thrown up for several days to a week after each operation.  His most recent was surgery was on Tuesday.  He has thrown up off and on since then.  He is unaware if it has been bloody, but after vomiting in front of me and it having definite blood present, he stated that this is what it has looked like at home.  He has a BM about once a week and denies blood, but then states he doesn't look at his stool so he isn't really sure.  He denies any abdominal pain in the past or present.  His last colonoscopy was 72 yo.    He went to HD this morning and was found to be hypotensive and felt to be dehydrated.  He was then referred to the [facility] for evaluation.  Upon arrival he had a work up completed.  His labs are normal and his vitals are normal.  However, he has a CT of the abdomen/pelvis which unfortunately reveals portal venous gas along with gastric pneumatosis along with presumed right sided colon neoplasm with presumed metastasis to the omentum, liver, and lungs.  We have been asked to see the patient for further evaluation and recommendation.  ROS: ROS: Please see HPI, otherwise negative.  He does have prosthetics for his legs and he uses them mostly for transfers. Otherwise he gets around with a wheelchair.    Family History  Problem Relation Age of Onset  . Diabetes Mother   . Cancer Mother        bone   . Kidney disease Father     Past Medical History:  Diagnosis Date  . Anemia   .  Arthritis   . Asthma   . Contracture of joint    left knee  . Decubitus ulcer of sacral region, stage 2 03/07/2014  . Diabetes mellitus with peripheral vascular disease ([place])   . End-stage renal disease on hemodialysis [facility])    Started HD March 2014.  Cause of ESRD was DM.  Gets HD at [facility] on [address] on MWF schedule.   Marland Kitchen ESRD on hemodialysis ([place])   . Gangrene ([place])    right BKA  . Gangrene ([place])    right index finger  . Gangrene of foot ([place])   . Glaucoma   . Glaucoma 03/07/2014  . Heart murmur   . History of MRSA infection 04/22/2013   Bacteremia assoc w L foot wound infection Mar 2015   . Hyperparathyroidism, secondary renal ([place])   . Hypertension   . Hypothyroidism   . MRSA bacteremia   . Multiple myeloma, without mention of having achieved remission 03/30/2012   Cytogenetic neg on 03/23/2012. Pt states he DOES NOT have cancer  . Necrosis ([place])    left long finger  . Peripheral arterial disease ([place])   . Peripheral vascular disease, unspecified ([place]) 11/19/2012   In the past had R foot toe amps then R TMA. In 2015 had left  foot toe amp > then TMA >then L BKA on 05/14/13   . Pneumonia    2012  . PONV (postoperative nausea and vomiting)   . Thyroid disease    hyperparathyroidism    Past Surgical History:  Procedure Laterality Date  . ABDOMINAL AORTAGRAM Bilateral 11/06/2012   Procedure: ABDOMINAL AORTAGRAM;  Surgeon: Elam Dutch, MD;  Location: [facility] CATH LAB;  Service: Cardiovascular;  Laterality: Bilateral;  . ABDOMINAL AORTOGRAM N/A 07/11/2017   Procedure: ABDOMINAL AORTOGRAM;  Surgeon: Elam Dutch, MD;  Location: [place] CV LAB;  Service: Cardiovascular;  Laterality: N/A;  . AMPUTATION Right 11/10/2012   Procedure: AMPUTATION FIRST and SECOND TOES Right Foot;  Surgeon: Elam Dutch, MD;  Location: [place];  Service: Vascular;  Laterality: Right;  . AMPUTATION Left 04/07/2013   Procedure: AMPUTATION DIGIT- LEFT 1ST TOE;  Surgeon: Mal Misty, MD;   Location: [place];  Service: Vascular;  Laterality: Left;  . AMPUTATION Left 04/26/2013   Procedure: Left Foot Transmetatarsal Amputation;  Surgeon: Newt Minion, MD;  Location: [place];  Service: Orthopedics;  Laterality: Left;  . AMPUTATION Left 05/14/2013   Procedure: AMPUTATION BELOW KNEE;  Surgeon: Newt Minion, MD;  Location: [place];  Service: Orthopedics;  Laterality: Left;  Left Below Knee Amputation  . AMPUTATION Left 07/23/2013   Procedure: AMPUTATION BELOW KNEE;  Surgeon: Newt Minion, MD;  Location: [place];  Service: Orthopedics;  Laterality: Left;  Left Below Knee Amputation Revision  . AMPUTATION Right 02/18/2014   Procedure: AMPUTATION BELOW KNEE;  Surgeon: Newt Minion, MD;  Location: [place];  Service: Orthopedics;  Laterality: Right;  . AMPUTATION Right 08/20/2016   Procedure: RIGHT LONG FINGER AMPUTATION;  Surgeon: Leanora Cover, MD;  Location: [place];  Service: Orthopedics;  Laterality: Right;  . AMPUTATION Right 01/11/2017   Procedure: RIGHT LONG FINGER AMPUTATION;  Surgeon: Leanora Cover, MD;  Location: [place];  Service: Orthopedics;  Laterality: Right;  . AMPUTATION Left 04/04/2017   Procedure: AMPUTATION DIGIT LEFT LONG FINGER;  Surgeon: Leanora Cover, MD;  Location: [place];  Service: Orthopedics;  Laterality: Left;  . AMPUTATION Right 06/13/2017   Procedure: INDEX FINGER RIGHT AMPUTATION;  Surgeon: Leanora Cover, MD;  Location: [place];  Service: Orthopedics;  Laterality: Right;  . AMPUTATION Left 07/29/2017   Procedure: LEFT RING FINGER AMPUTATION;  Surgeon: Leanora Cover, MD;  Location: [place];  Service: Orthopedics;  Laterality: Left;  . AORTIC ARCH ANGIOGRAPHY N/A 07/11/2017   Procedure: AORTIC ARCH ANGIOGRAPHY;  Surgeon: Elam Dutch, MD;  Location: [place] CV LAB;  Service: Cardiovascular;  Laterality: N/A;  . AV FISTULA PLACEMENT Left 03/25/2012   Procedure: ARTERIOVENOUS (AV) FISTULA CREATION;  Surgeon: Mal Misty, MD;  Location: [place];   Service: Vascular;  Laterality: Left;  . BELOW KNEE LEG AMPUTATION Left 05/14/2013   DR [name]  . BELOW KNEE LEG AMPUTATION Right 02/18/2014   dr duda  . CARDIAC CATHETERIZATION     approx 30 years ago  . CERVICAL DISC SURGERY    . EYE SURGERY Bilateral    CATARACTS  . I&D EXTREMITY Left 04/22/2013   Procedure: IRRIGATION AND DEBRIDEMENT LEFT FIRST TOE AMPUTATION WOUND ;  Surgeon: Mal Misty, MD;  Location: [place];  Service: Vascular;  Laterality: Left;  . I&D EXTREMITY Right 01/05/2014   Procedure: IRRIGATION AND DEBRIDEMENT Right Heel Ulcer;  Surgeon: Mcarthur Rossetti, MD;  Location: [place];  Service: Orthopedics;  Laterality: Right;  Surgeon Available after 5PM  . I&D EXTREMITY Left 07/29/2017   Procedure: DEBRIDEMENT WOUND;  Surgeon: Leanora Cover, MD;  Location: [place];  Service: Orthopedics;  Laterality: Left;  . INSERTION OF DIALYSIS CATHETER Right 03/19/2012   Procedure: INSERTION OF DIALYSIS CATHETER;  Surgeon: Mal Misty, MD;  Location: [place];  Service: Vascular;  Laterality: Right;  Right Internal Jugular  . LIGATION OF COMPETING BRANCHES OF ARTERIOVENOUS FISTULA Left 05/08/2012   Procedure: LIGATION OF COMPETING BRANCHES OF ARTERIOVENOUS FISTULA;  Surgeon: Mal Misty, MD;  Location: [place];  Service: Vascular;  Laterality: Left;  Ultrasound guided  . REPAIR QUADRICEPS/HAMSTRING MUSCLES Left 05/20/2014   Procedure: Left Hamstring Release;  Surgeon: Newt Minion, MD;  Location: [place];  Service: Orthopedics;  Laterality: Left;  . REVISON OF ARTERIOVENOUS FISTULA Left 01/30/2016   Procedure: CEPHALIC VEIN TURNDOWN TO LEFT UPPER ARM;  Surgeon: Elam Dutch, MD;  Location: [place];  Service: Vascular;  Laterality: Left;  . STUMP REVISION Right 05/20/2014   Procedure: Revision Right Below Knee Amputation;  Surgeon: Newt Minion, MD;  Location: [place];  Service: Orthopedics;  Laterality: Right;  . TEE WITHOUT CARDIOVERSION N/A 04/20/2013   Procedure: TRANSESOPHAGEAL  ECHOCARDIOGRAM (TEE);  Surgeon: Josue Hector, MD;  Location: [facility] ENDOSCOPY;  Service: Cardiovascular;  Laterality: N/A;  . THYROIDECTOMY    . TOE AMPUTATION     D/C 04-30-13  . TRANSMETATARSAL AMPUTATION Left 12/16/2012   Procedure: TRANSMETATARSAL AMPUTATION AND VAC PLACEMENT;  Surgeon: Elam Dutch, MD;  Location: [place];  Service: Vascular;  Laterality: Left;  . UPPER EXTREMITY ANGIOGRAPHY Right 07/11/2017   Procedure: UPPER EXTREMITY ANGIOGRAPHY;  Surgeon: Elam Dutch, MD;  Location: [place] CV LAB;  Service: Cardiovascular;  Laterality: Right;    Social History:  reports that he quit smoking about 35 years ago. His smoking use included cigarettes. He has never used smokeless tobacco. He reports that he does not drink alcohol or use drugs.  Allergies:  Allergies  Allergen Reactions  . Bee Venom Anaphylaxis  . Ivp Dye [Iodinated Diagnostic Agents] Hives  . Lisinopril Cough  . Morphine And Related Hives and Other (See Comments)    Bradycardia      (Not in a hospital admission)   Physical Exam: Blood pressure 116/78, pulse 98, temperature 98 F (36.7 C), temperature source Oral, resp. rate 19, weight 67.1 kg (148 lb), SpO2 100 %. General: pleasant, WD, WN black male who is laying in bed in NAD, but clearing not feeling well as he continues to vomit while I'm present. HEENT: head is normocephalic, atraumatic.  Sclera are noninjected.  PERRL.  Ears and nose without any masses or lesions.  Mouth is pink but dry Heart: regular, rate, and rhythm.  Normal s1,s2. No obvious  gallops, or rubs noted. +murmur. Palpable radial pulse in RUE.  Left wrist is wrapped with OR dressing.  B BKA Lungs/chest: CTAB, no wheezes, rhonchi, or rales noted.  Respiratory effort nonlabored.  permcath present in RUC Abd: soft, NT, mild distention, +BS, no masses, hernias, or organomegaly palpable MS: arms with no edema and good ROM.  Right hand with missing digits as well as left hand with missing  2,3rd digits and wrapped.  B BKAs Skin: warm and dry with no masses, lesions, or rashes Psych: A&Ox3 with an appropriate affect.   Results for orders placed or performed during the hospital encounter of 08/01/17 (from the past 48 hour(s))  Basic metabolic panel     Status: Abnormal   Collection Time: 08/01/17  9:35 AM  Result Value Ref Range   Sodium 144 135 - 145 mmol/L   Potassium 4.1 3.5 - 5.1 mmol/L   Chloride 99 98 - 111 mmol/L    Comment: Please note change in reference range.   CO2 28 22 - 32 mmol/L   Glucose, Bld 140 (H) 70 - 99 mg/dL    Comment: Please note change in reference range.   BUN 27 (H) 8 - 23 mg/dL    Comment: Please note change in reference range.   Creatinine, Ser 6.64 (H) 0.61 - 1.24 mg/dL   Calcium 8.7 (L) 8.9 - 10.3 mg/dL   GFR calc non Af Amer 7 (L) >60 mL/min   GFR calc Af Amer 8 (L) >60 mL/min    Comment: (NOTE) The eGFR has been calculated using the CKD EPI equation. This calculation has not been validated in all clinical situations. eGFR's persistently <60 mL/min signify possible Chronic Kidney Disease.    Anion gap 17 (H) 5 - 15    Comment: Performed at [facility], [place] [address] [place], [place] [address]  CBC     Status: Abnormal   Collection Time: 08/01/17  9:35 AM  Result Value Ref Range   WBC 9.1 4.0 - 10.5 K/uL   RBC 5.09 4.22 - 5.81 MIL/uL   Hemoglobin 12.6 (L) 13.0 - 17.0 g/dL   HCT 43.4 39.0 - 52.0 %   MCV 85.3 78.0 - 100.0 fL   MCH 24.8 (L) 26.0 - 34.0 pg   MCHC 29.0 (L) 30.0 - 36.0 g/dL   RDW 17.2 (H) 11.5 - 15.5 %   Platelets 352 150 - 400 K/uL    Comment: Performed at [facility], [place] [address], [place], [place] [address]  I-stat troponin, ED     Status: None   Collection Time: 08/01/17  9:40 AM  Result Value Ref Range   Troponin i, poc 0.04 0.00 - 0.08 ng/mL   Comment 3            Comment: Due to the release kinetics of cTnI, a negative result within the first hours of the onset of symptoms does not rule  out myocardial infarction with certainty. If myocardial infarction is still suspected, repeat the test at appropriate intervals.   Hepatic function panel     Status: Abnormal   Collection Time: 08/01/17 11:25 AM  Result Value Ref Range   Total Protein 7.8 6.5 - 8.1 g/dL   Albumin 2.9 (L) 3.5 - 5.0 g/dL   AST 21 15 - 41 U/L   ALT 6 0 - 44 U/L    Comment: Please note change in reference range.   Alkaline Phosphatase 131 (H) 38 - 126 U/L   Total Bilirubin 0.9 0.3 - 1.2 mg/dL   Bilirubin, Direct 0.2 0.0 - 0.2 mg/dL    Comment: Please note change in reference range.   Indirect Bilirubin 0.7 0.3 - 0.9 mg/dL    Comment: Performed at [facility], [place] [address] [place], [place] [address]  Lipase, blood     Status: None   Collection Time: 08/01/17 11:25 AM  Result Value Ref Range   Lipase 23 11 - 51 U/L    Comment: Performed at [facility] [address] [place], [place] [address]   Ct Abdomen Pelvis Wo Contrast  Addendum Date: 08/01/2017   ADDENDUM REPORT: 08/01/2017  12:02 ADDENDUM: These results were called by telephone at the time of interpretation on 08/01/2017 prior to signing off the report at approximately 11:50 a.m. to Dr. Lajean Saver , who verbally acknowledged these results. Electronically Signed   By: Inge Rise M.D.   On: 08/01/2017 12:02   Result Date: 08/01/2017 CLINICAL DATA:  Abdominal pain with nausea and vomiting beginning today. EXAM: CT ABDOMEN AND PELVIS WITHOUT CONTRAST TECHNIQUE: Multidetector CT imaging of the abdomen and pelvis was performed following the standard protocol without IV contrast. COMPARISON:  CT abdomen and pelvis 02/24/2013. FINDINGS: Lower chest: Innumerable pulmonary nodules are seen in the lung bases and are new since the prior CT scan index nodules include a 0.7 cm nodule in the left lower lobe on image 2 and a second 0.7 cm nodule in the left lower lobe on image 7. Small bilateral pleural effusions are present, larger on the  left. No pericardial effusion. Extensive calcific aortic and coronary atherosclerosis is noted. Hepatobiliary: Portal venous gas is identified. There is a low attenuating lesion inferior right hepatic lobe measuring 1.8 cm on image 29. Visualization of the liver is limited due to streak artifact from a metallic device along the right flank. Gallbladder and biliary tree are unremarkable. Pancreas: Severely atrophic as seen on the prior CT. No focal lesion or biliary ductal dilatation. Spleen: Normal in size.  No focal lesion. Adrenals/Urinary Tract: Marked renal atrophy is seen bilaterally. No focal renal lesion. Urinary bladder stone is unchanged. The adrenal glands appear normal. Stomach/Bowel: Pneumatosis is seen in the stomach wall. The stomach is moderately dilated. Focal wall thickening is seen in the mid descending colon. Focal wall seen on image 54 series 3 thickening of the ascending colon as is highly suspicious for carcinoma. There is scattered diverticulosis. Vascular/Lymphatic: Extensive atherosclerotic vascular disease is identified. No aneurysm. No lymphadenopathy. Reproductive: Prostate is enlarged. Other: There is a small volume of abdominal ascites. Nodularity of the omentum is identified and most conspicuous in the left abdomen. An omental lymph node measuring 1.7 cm is seen on image 59. No free intraperitoneal air is seen. Musculoskeletal: No fracture or focal lesion. IMPRESSION: Gastric pneumatosis and portal venous gas can be seen in infection of the stomach wall by gas-forming organisms and presumably gastric ischemia. Findings most consistent with carcinoma of the ascending colon with innumerable pulmonary metastases, omental metastases and a single liver metastasis seen on this uninfused exam. Small bilateral pleural effusions. Extensive atherosclerosis. Electronically Signed: By: Inge Rise M.D. On: 08/01/2017 11:50   Dg Chest 2 View  Result Date: 08/01/2017 CLINICAL DATA:   Vomiting last night.  Dialysis patient.  Chest pain. EXAM: CHEST - 2 VIEW COMPARISON:  07/14/2017 FINDINGS: Chronic elevation of the right hemidiaphragm. Dialysis catheter tip in the right atrium. Chronic cardiomegaly and aortic atherosclerosis. Patchy density in the lungs that could be due to developing edema or aspiration. No dense consolidation or collapse. Probable pleural effusion on the right. IMPRESSION: Chronic elevation of the right hemidiaphragm. Patchy density in the lungs left more than right that could be due to developing edema or aspiration. Electronically Signed   By: Nelson Chimes M.D.   On: 08/01/2017 09:33      Assessment/Plan Gastric pneumatosis Unfortunately this patient has been found to have gastric pneumatosis on his CT scan.  Currently the patient is stable with a normal BP, WBC, and temp.  His abdominal exam is benign.  He is continuing to have bloody emesis.  We will place an NGT  for gastric decompression and control of his nausea/vomiting.  This will also allow his stomach to rest and hopefully recovery.  It is possible even though he has been found to have pneumatosis that his stomach is not completely ischemic and this could resolve on its own with conservative management.  We need to make sure his BP stills higher rather that low to continue adequate flow to his gut.  This will be super important when he is in HD.  The other issue with this patient is he has been found to have a presumed neoplasm of the right colon with presumed carcinomatosis, liver lesions, and pulmonary lesions.  Because of this extensive finding, even if the patient worsened in relation to his stomach, I doubt he would be a surgical candidate.  I'm concerned that if he had any type of resection, given his vascular disease, he would be high risk of complication with a staple line/anastamosis due to limited flow, nevermind operating on a patient for this type of condition in the setting of widely metastatic  disease is not recommended.  We will closely follow the patient and see how he progresses.  He needs this acute problem addressed first.  If he recovers from this, then a discussion can be had in regards to his suspected malignancy.  Presumed colonic neoplasm with carcinomatosis, liver lesions, and pulmonary lesions See above discussion.  ESRD HTN DM PVD/PAD H/O MM  FEN - NPO, NGT VTE - SCDs, likely holding prophylaxis now secondary to bloody emesis ID - empirically start zosyn, per pharmacy  Henreitta Cea, [facility] Surgery 08/01/2017, 1:50 PM Pager: [phone]

## 2017-08-01 NOTE — H&P (Addendum)
History and Physical    Jack [name] XYO:[id number] DOB: 17-Jan-1945 DOA: 08/01/2017  I have briefly reviewed the patient's prior medical records in [facility]  PCP: Martinique, Betty G, MD  Patient coming from: home  Chief Complaint: vomiting blood   HPI: Jack [name] is a 72 y.o. male with medical history significant of end-stage renal disease, diabetes mellitus, bilateral amputee, significant peripheral vascular disease who was just recently hospitalized 3 days ago underwent surgery for left ring finger amputation due to gangrene, is being brought to the hospital from his dialysis unit due to hypotension, nausea and vomiting.  Patient tells me that he has been having nausea and vomiting since last night, and today he started noticing bright red blood.  He denies any chest pain or breathing difficulties.  He has had no fever or chills.  He denies any lightheadedness or dizziness.  He was also told today that his blood pressure is low during dialysis but he does not know how low.  ED Course: In the emergency room he is afebrile, satting 100% on room air, few blood pressure readings were in the 60s however on recheck was 170.  He underwent a CT scan of the abdomen and pelvis which showed gastric pneumatosis and portal venous gas concerning for gastric ischemia.  CT scan also showed likelihood of ascending colon cancer with pulmonary, omental and liver mets.  General surgery was consulted and we are asked to admit  Review of Systems: As per HPI otherwise 10 point review of systems negative.   Past Medical History:  Diagnosis Date  . Anemia   . Arthritis   . Asthma   . Contracture of joint    left knee  . Decubitus ulcer of sacral region, stage 2 03/07/2014  . Diabetes mellitus with peripheral vascular disease ([place])   . End-stage renal disease on hemodialysis [facility])    Started HD March 2014.  Cause of ESRD was DM.  Gets HD at [facility] on [address] on MWF schedule.   Marland Kitchen  ESRD on hemodialysis ([place])   . Gangrene ([place])    right BKA  . Gangrene ([place])    right index finger  . Gangrene of foot ([place])   . Glaucoma   . Glaucoma 03/07/2014  . Heart murmur   . History of MRSA infection 04/22/2013   Bacteremia assoc w L foot wound infection Mar 2015   . Hyperparathyroidism, secondary renal ([place])   . Hypertension   . Hypothyroidism   . MRSA bacteremia   . Multiple myeloma, without mention of having achieved remission 03/30/2012   Cytogenetic neg on 03/23/2012. Pt states he DOES NOT have cancer  . Necrosis ([place])    left long finger  . Peripheral arterial disease ([place])   . Peripheral vascular disease, unspecified ([place]) 11/19/2012   In the past had R foot toe amps then R TMA. In 2015 had left foot toe amp > then TMA >then L BKA on 05/14/13   . Pneumonia    2012  . PONV (postoperative nausea and vomiting)   . Thyroid disease    hyperparathyroidism    Past Surgical History:  Procedure Laterality Date  . ABDOMINAL AORTAGRAM Bilateral 11/06/2012   Procedure: ABDOMINAL AORTAGRAM;  Surgeon: Elam Dutch, MD;  Location: [facility] CATH LAB;  Service: Cardiovascular;  Laterality: Bilateral;  . ABDOMINAL AORTOGRAM N/A 07/11/2017   Procedure: ABDOMINAL AORTOGRAM;  Surgeon: Elam Dutch, MD;  Location: [place] CV LAB;  Service: Cardiovascular;  Laterality: N/A;  . AMPUTATION Right 11/10/2012   Procedure: AMPUTATION FIRST and SECOND TOES Right Foot;  Surgeon: Elam Dutch, MD;  Location: [place];  Service: Vascular;  Laterality: Right;  . AMPUTATION Left 04/07/2013   Procedure: AMPUTATION DIGIT- LEFT 1ST TOE;  Surgeon: Mal Misty, MD;  Location: [place];  Service: Vascular;  Laterality: Left;  . AMPUTATION Left 04/26/2013   Procedure: Left Foot Transmetatarsal Amputation;  Surgeon: Newt Minion, MD;  Location: [place];  Service: Orthopedics;  Laterality: Left;  . AMPUTATION Left 05/14/2013   Procedure: AMPUTATION BELOW KNEE;  Surgeon: Newt Minion, MD;  Location: [place];   Service: Orthopedics;  Laterality: Left;  Left Below Knee Amputation  . AMPUTATION Left 07/23/2013   Procedure: AMPUTATION BELOW KNEE;  Surgeon: Newt Minion, MD;  Location: [place];  Service: Orthopedics;  Laterality: Left;  Left Below Knee Amputation Revision  . AMPUTATION Right 02/18/2014   Procedure: AMPUTATION BELOW KNEE;  Surgeon: Newt Minion, MD;  Location: [place];  Service: Orthopedics;  Laterality: Right;  . AMPUTATION Right 08/20/2016   Procedure: RIGHT LONG FINGER AMPUTATION;  Surgeon: Leanora Cover, MD;  Location: [place];  Service: Orthopedics;  Laterality: Right;  . AMPUTATION Right 01/11/2017   Procedure: RIGHT LONG FINGER AMPUTATION;  Surgeon: Leanora Cover, MD;  Location: [place];  Service: Orthopedics;  Laterality: Right;  . AMPUTATION Left 04/04/2017   Procedure: AMPUTATION DIGIT LEFT LONG FINGER;  Surgeon: Leanora Cover, MD;  Location: [place];  Service: Orthopedics;  Laterality: Left;  . AMPUTATION Right 06/13/2017   Procedure: INDEX FINGER RIGHT AMPUTATION;  Surgeon: Leanora Cover, MD;  Location: [place];  Service: Orthopedics;  Laterality: Right;  . AMPUTATION Left 07/29/2017   Procedure: LEFT RING FINGER AMPUTATION;  Surgeon: Leanora Cover, MD;  Location: [place];  Service: Orthopedics;  Laterality: Left;  . AORTIC ARCH ANGIOGRAPHY N/A 07/11/2017   Procedure: AORTIC ARCH ANGIOGRAPHY;  Surgeon: Elam Dutch, MD;  Location: [place] CV LAB;  Service: Cardiovascular;  Laterality: N/A;  . AV FISTULA PLACEMENT Left 03/25/2012   Procedure: ARTERIOVENOUS (AV) FISTULA CREATION;  Surgeon: Mal Misty, MD;  Location: [place];  Service: Vascular;  Laterality: Left;  . BELOW KNEE LEG AMPUTATION Left 05/14/2013   DR [name]  . BELOW KNEE LEG AMPUTATION Right 02/18/2014   dr duda  . CARDIAC CATHETERIZATION     approx 30 years ago  . CERVICAL DISC SURGERY    . EYE SURGERY Bilateral    CATARACTS  . I&D EXTREMITY Left 04/22/2013   Procedure: IRRIGATION AND  DEBRIDEMENT LEFT FIRST TOE AMPUTATION WOUND ;  Surgeon: Mal Misty, MD;  Location: ;  Service: Vascular;  Laterality: Left;  . I&D EXTREMITY Right 01/05/2014   Procedure: IRRIGATION AND DEBRIDEMENT Right Heel Ulcer;  Surgeon: Mcarthur Rossetti, MD;  Location: [place];  Service: Orthopedics;  Laterality: Right;  Surgeon Available after 5PM  . I&D EXTREMITY Left 07/29/2017   Procedure: DEBRIDEMENT WOUND;  Surgeon: Leanora Cover, MD;  Location: [place];  Service: Orthopedics;  Laterality: Left;  . INSERTION OF DIALYSIS CATHETER Right 03/19/2012   Procedure: INSERTION OF DIALYSIS CATHETER;  Surgeon: Mal Misty, MD;  Location: [place];  Service: Vascular;  Laterality: Right;  Right Internal Jugular  . LIGATION OF COMPETING BRANCHES OF ARTERIOVENOUS FISTULA Left 05/08/2012   Procedure: LIGATION OF COMPETING BRANCHES OF ARTERIOVENOUS FISTULA;  Surgeon: Mal Misty, MD;  Location: [facility]  OR;  Service: Vascular;  Laterality: Left;  Ultrasound guided  . REPAIR QUADRICEPS/HAMSTRING MUSCLES Left 05/20/2014   Procedure: Left Hamstring Release;  Surgeon: Newt Minion, MD;  Location: [place];  Service: Orthopedics;  Laterality: Left;  . REVISON OF ARTERIOVENOUS FISTULA Left 01/30/2016   Procedure: CEPHALIC VEIN TURNDOWN TO LEFT UPPER ARM;  Surgeon: Elam Dutch, MD;  Location: [place];  Service: Vascular;  Laterality: Left;  . STUMP REVISION Right 05/20/2014   Procedure: Revision Right Below Knee Amputation;  Surgeon: Newt Minion, MD;  Location: [place];  Service: Orthopedics;  Laterality: Right;  . TEE WITHOUT CARDIOVERSION N/A 04/20/2013   Procedure: TRANSESOPHAGEAL ECHOCARDIOGRAM (TEE);  Surgeon: Josue Hector, MD;  Location: [facility] ENDOSCOPY;  Service: Cardiovascular;  Laterality: N/A;  . THYROIDECTOMY    . TOE AMPUTATION     D/C 04-30-13  . TRANSMETATARSAL AMPUTATION Left 12/16/2012   Procedure: TRANSMETATARSAL AMPUTATION AND VAC PLACEMENT;  Surgeon: Elam Dutch, MD;  Location: [place];   Service: Vascular;  Laterality: Left;  . UPPER EXTREMITY ANGIOGRAPHY Right 07/11/2017   Procedure: UPPER EXTREMITY ANGIOGRAPHY;  Surgeon: Elam Dutch, MD;  Location: [place] CV LAB;  Service: Cardiovascular;  Laterality: Right;     reports that he quit smoking about 35 years ago. His smoking use included cigarettes. He has never used smokeless tobacco. He reports that he does not drink alcohol or use drugs.  Allergies  Allergen Reactions  . Bee Venom Anaphylaxis  . Ivp Dye [Iodinated Diagnostic Agents] Hives  . Lisinopril Cough  . Morphine And Related Hives and Other (See Comments)    Bradycardia     Family History  Problem Relation Age of Onset  . Diabetes Mother   . Cancer Mother        bone   . Kidney disease Father     Prior to Admission medications   Medication Sig Start Date End Date Taking? Authorizing Provider  aspirin EC 81 MG tablet Take 81 mg by mouth daily.   Yes [provider]  atorvastatin (LIPITOR) 10 MG tablet TAKE 1 TABLET (10 MG TOTAL) BY MOUTH DAILY. 03/17/17  Yes Martinique, Betty G, MD  lanthanum (FOSRENOL) 1000 MG chewable tablet Chew 1,000 mg by mouth daily as needed.  12/16/16  Yes [provider]  levothyroxine (SYNTHROID, LEVOTHROID) 150 MCG tablet TAKE 1/2 TABLET BY MOUTH ON MONDAYS AND 1 TABLET BY MOUTH THE REST OF THE WEEK. 06/17/17  Yes Martinique, Betty G, MD  multivitamin (RENA-VIT) TABS tablet Take 1 tablet by mouth 4 (four) times a week.    Yes [provider]  naproxen sodium (ALEVE) 220 MG tablet Take 220 mg by mouth daily as needed (pain).   Yes [provider]  ondansetron (ZOFRAN) 4 MG tablet Take 1 tablet (4 mg total) by mouth every 8 (eight) hours as needed for nausea or vomiting. 07/29/17  Yes Leanora Cover, MD  oxyCODONE (ROXICODONE) 5 MG immediate release tablet 1-2 p.o. every 6 hours as needed pain 07/29/17  Yes Leanora Cover, MD  oxyCODONE-acetaminophen (PERCOCET) 5-325 MG tablet 1-2 tabs po q6 hours prn  pain Patient taking differently: Take 1-2 tablets by mouth every 6 (six) hours as needed for moderate pain or severe pain. 1-2 tabs po q6 hours prn pain 04/04/17  Yes Leanora Cover, MD  Continuous Blood Gluc Sensor (FREESTYLE LIBRE 14 DAY SENSOR) MISC USE DAILY*NOT COVERED* 06/27/17   Martin[place], Betty G, MD  Continuous Blood Gluc Sensor (FREESTYLE LIBRE 14 DAY  SENSOR) MISC USE DAILY*NOT COVERED* 07/15/17   [place], Betty G, MD    Physical Exam: Vitals:   08/01/17 6773 08/01/17 0846 08/01/17 0915 08/01/17 1200  BP:  108/70 118/70 103/86  Pulse: 85  98 98  Resp: '16  18 17  ' Temp: 98 F (36.7 C)     TempSrc: Oral     SpO2: 100%  97% 100%  Weight:          Constitutional: Ill-appearing AA gentleman Eyes: lids and conunctivae normalj ENMT: Mucous membranes are moist, has some dried blood at the corner of the mouth Neck: normal, supple Respiratory: clear to auscultation bilaterally, no wheezing, no crackles. Normal respiratory effort. No accessory muscle use.  Cardiovascular: Regular rate and rhythm,3/6 SEM.  Abdomen: soft, no tenderness, no masses palpated. Bowel sounds positive.  Musculoskeletal: no clubbing / cyanosis  Skin: no rashes Neurologic: non focal   Labs on Admission: I have personally reviewed following labs and imaging studies  CBC: Recent Labs  Lab 07/29/17 1448 08/01/17 0935  WBC  --  9.1  HGB 12.6* 12.6*  HCT 37.0* 43.4  MCV  --  85.3  PLT  --  736   Basic Metabolic Panel: Recent Labs  Lab 07/29/17 1448 08/01/17 0935  NA 140 144  K 3.7 4.1  CL 98 99  CO2  --  28  GLUCOSE 100* 140*  BUN 20 27*  CREATININE 5.40* 6.64*  CALCIUM  --  8.7*   GFR: Estimated Creatinine Clearance: 9.3 mL/min (A) (by C-G formula based on SCr of 6.64 mg/dL (H)). Liver Function Tests: Recent Labs  Lab 08/01/17 1125  AST 21  ALT 6  ALKPHOS 131*  BILITOT 0.9  PROT 7.8  ALBUMIN 2.9*   Recent Labs  Lab 08/01/17 1125  LIPASE 23   No results for input(s): AMMONIA in the  last 168 hours. Coagulation Profile: No results for input(s): INR, PROTIME in the last 168 hours. Cardiac Enzymes: No results for input(s): CKTOTAL, CKMB, CKMBINDEX, TROPONINI in the last 168 hours. BNP (last 3 results) No results for input(s): PROBNP in the last 8760 hours. HbA1C: No results for input(s): HGBA1C in the last 72 hours. CBG: Recent Labs  Lab 07/29/17 1348  GLUCAP 111*   Lipid Profile: No results for input(s): CHOL, HDL, LDLCALC, TRIG, CHOLHDL, LDLDIRECT in the last 72 hours. Thyroid Function Tests: No results for input(s): TSH, T4TOTAL, FREET4, T3FREE, THYROIDAB in the last 72 hours. Anemia Panel: No results for input(s): VITAMINB12, FOLATE, FERRITIN, TIBC, IRON, RETICCTPCT in the last 72 hours. Urine analysis:    Component Value Date/Time   COLORURINE YELLOW 02/23/2013 2008   APPEARANCEUR CLOUDY (A) 02/23/2013 2008   LABSPEC 1.019 02/23/2013 2008   PHURINE 6.0 02/23/2013 2008   GLUCOSEU NEGATIVE 02/23/2013 2008   HGBUR LARGE (A) 02/23/2013 2008   BILIRUBINUR SMALL (A) 02/23/2013 2008   KETONESUR NEGATIVE 02/23/2013 2008   PROTEINUR >300 (A) 02/23/2013 2008   UROBILINOGEN 0.2 02/23/2013 2008   NITRITE NEGATIVE 02/23/2013 2008   LEUKOCYTESUR LARGE (A) 02/23/2013 2008     Radiological Exams on Admission: Ct Abdomen Pelvis Wo Contrast  Addendum Date: 08/01/2017   ADDENDUM REPORT: 08/01/2017 12:02 ADDENDUM: These results were called by telephone at the time of interpretation on 08/01/2017 prior to signing off the report at approximately 11:50 a.m. to Dr. Lajean Saver , who verbally acknowledged these results. Electronically Signed   By: Inge Rise M.D.   On: 08/01/2017 12:02   Result Date: 08/01/2017 CLINICAL DATA:  Abdominal pain with nausea and vomiting beginning today. EXAM: CT ABDOMEN AND PELVIS WITHOUT CONTRAST TECHNIQUE: Multidetector CT imaging of the abdomen and pelvis was performed following the standard protocol without IV contrast. COMPARISON:   CT abdomen and pelvis 02/24/2013. FINDINGS: Lower chest: Innumerable pulmonary nodules are seen in the lung bases and are new since the prior CT scan index nodules include a 0.7 cm nodule in the left lower lobe on image 2 and a second 0.7 cm nodule in the left lower lobe on image 7. Small bilateral pleural effusions are present, larger on the left. No pericardial effusion. Extensive calcific aortic and coronary atherosclerosis is noted. Hepatobiliary: Portal venous gas is identified. There is a low attenuating lesion inferior right hepatic lobe measuring 1.8 cm on image 29. Visualization of the liver is limited due to streak artifact from a metallic device along the right flank. Gallbladder and biliary tree are unremarkable. Pancreas: Severely atrophic as seen on the prior CT. No focal lesion or biliary ductal dilatation. Spleen: Normal in size.  No focal lesion. Adrenals/Urinary Tract: Marked renal atrophy is seen bilaterally. No focal renal lesion. Urinary bladder stone is unchanged. The adrenal glands appear normal. Stomach/Bowel: Pneumatosis is seen in the stomach wall. The stomach is moderately dilated. Focal wall thickening is seen in the mid descending colon. Focal wall seen on image 54 series 3 thickening of the ascending colon as is highly suspicious for carcinoma. There is scattered diverticulosis. Vascular/Lymphatic: Extensive atherosclerotic vascular disease is identified. No aneurysm. No lymphadenopathy. Reproductive: Prostate is enlarged. Other: There is a small volume of abdominal ascites. Nodularity of the omentum is identified and most conspicuous in the left abdomen. An omental lymph node measuring 1.7 cm is seen on image 59. No free intraperitoneal air is seen. Musculoskeletal: No fracture or focal lesion. IMPRESSION: Gastric pneumatosis and portal venous gas can be seen in infection of the stomach wall by gas-forming organisms and presumably gastric ischemia. Findings most consistent with  carcinoma of the ascending colon with innumerable pulmonary metastases, omental metastases and a single liver metastasis seen on this uninfused exam. Small bilateral pleural effusions. Extensive atherosclerosis. Electronically Signed: By: Inge Rise M.D. On: 08/01/2017 11:50   Dg Chest 2 View  Result Date: 08/01/2017 CLINICAL DATA:  Vomiting last night.  Dialysis patient.  Chest pain. EXAM: CHEST - 2 VIEW COMPARISON:  07/14/2017 FINDINGS: Chronic elevation of the right hemidiaphragm. Dialysis catheter tip in the right atrium. Chronic cardiomegaly and aortic atherosclerosis. Patchy density in the lungs that could be due to developing edema or aspiration. No dense consolidation or collapse. Probable pleural effusion on the right. IMPRESSION: Chronic elevation of the right hemidiaphragm. Patchy density in the lungs left more than right that could be due to developing edema or aspiration. Electronically Signed   By: Nelson Chimes M.D.   On: 08/01/2017 09:33    EKG: Independently reviewed. Sinus rhythm   Assessment/Plan Active Problems:   GI bleed    Hematemesis -With CT scan findings concerning for gastric pneumatosis, suspicion for gastric ischemia, general surgery consulted and appreciate input, discussed with Saverio Danker PA -For now conservative management, they are considering an NG tube -Antiemetics PRN -Keep strict n.p.o.  Anemia of chronic renal disease / ABLA -Hemoglobin stable, will continue to monitor given hematemesis  End-stage renal disease -On MWF dialysis, received about 1 hour today but had to stop due to nausea vomiting and hypotension.  Consulted nephrology, discussed with Dr. Joelyn Oms  Hypothyroidism -IV Synthroid  Type 2 diabetes  mellitus -Sliding scale every 4 h while n.p.o.  Metastatic colon cancer -With eventual need GI evaluation with colonoscopy and biopsy as well as oncology referral, may be done as an outpatient.  His stomach issues are more pressing and  take precedence right now.  PVD -s/p bilateral LE amputee, several right and left fingers amputated.    DVT prophylaxis: SCDs  Code Status: Full code  Family Communication: daughters bedside Disposition Plan: admit to SDU Consults called: general surgery, nephrology      Admission status: inpatient     At the time of admission, it appears that the appropriate admission status for this patient is INPATIENT. This is judged to be reasonable and necessary in order to provide the required high service intensity to ensure the patient's safety given the presenting symptoms, physical exam findings, and initial radiographic and laboratory data in the context of their chronic comorbidities. Current circumstances are gastric ischemia, hematemesis, and it is felt to place patient at high risk for further clinical deterioration threatening life, limb, or organ. Moreover, it is my clinical judgment that the patient will require inpatient hospital care spanning beyond 2 midnights from the point of admission and that early discharge would result in unnecessary risk of decompensation and readmission or threat to life, limb or bodily function.   Marzetta Board, MD [facility] Pager [phone]  If 7PM-7AM, please contact night-coverage [email or web address] Password [email or web address]  08/01/2017, 1:07 PM

## 2017-08-01 NOTE — Consult Note (Signed)
[facility] Renal Consultation Note    Indication for Consultation:  Management of ESRD/hemodialysis, anemia, hypertension/volume, and secondary hyperparathyroidism. PCP:  HPI: Jack [name] is a 72 y.o. male with ESRD, Type 2 DM, HTN, hypothyroidism, MGUS, and severe PVD (s/p B BKA, multiple recent finger amputations - last done 07/29/17) who was admitted with hematemesis.  Pt went to HD this morning like usual. About 1.5hr in, he developed CP, abd pain, nausea and vomiting. Reported small amount of blood in vomitus. EMS called brought to the ED for evaluation. There, vitals were ok. EKG stable. Labs showed Na 144, K 4.1, WBC 9.1, Hgb 12.6. He underwent abd CT which showed gastric pneumatosis concerning for gastric + intestinal ischemia as well as innumerable pulm nodules concerning for metastatic cancer what appeared to be a mass in the ascending colon. Surgery was consulted, being treated conservatively for now. At this time, he denies CP, dyspnea, abdominal pain, fever, or chills.  From a dialysis standpoint, dialyzes MWF at [facility]. Half treatment completed this morning prior to the above. He uses a [facility] for his access.  Past Medical History:  Diagnosis Date  . Anemia   . Arthritis   . Asthma   . Contracture of joint    left knee  . Decubitus ulcer of sacral region, stage 2 03/07/2014  . Diabetes mellitus with peripheral vascular disease ([place])   . End-stage renal disease on hemodialysis [facility])    Started HD March 2014.  Cause of ESRD was DM.  Gets HD at [facility] on [address] on MWF schedule.   Marland Kitchen ESRD on hemodialysis ([place])   . Gangrene ([place])    right BKA  . Gangrene ([place])    right index finger  . Gangrene of foot ([place])   . Glaucoma   . Glaucoma 03/07/2014  . Heart murmur   . History of MRSA infection 04/22/2013   Bacteremia assoc w L foot wound infection Mar 2015   . Hyperparathyroidism, secondary renal ([place])   . Hypertension   . Hypothyroidism   . MRSA  bacteremia   . Multiple myeloma, without mention of having achieved remission 03/30/2012   Cytogenetic neg on 03/23/2012. Pt states he DOES NOT have cancer  . Necrosis ([place])    left long finger  . Peripheral arterial disease ([place])   . Peripheral vascular disease, unspecified ([place]) 11/19/2012   In the past had R foot toe amps then R TMA. In 2015 had left foot toe amp > then TMA >then L BKA on 05/14/13   . Pneumonia    2012  . PONV (postoperative nausea and vomiting)   . Thyroid disease    hyperparathyroidism   Past Surgical History:  Procedure Laterality Date  . ABDOMINAL AORTAGRAM Bilateral 11/06/2012   Procedure: ABDOMINAL AORTAGRAM;  Surgeon: Elam Dutch, MD;  Location: [facility] CATH LAB;  Service: Cardiovascular;  Laterality: Bilateral;  . ABDOMINAL AORTOGRAM N/A 07/11/2017   Procedure: ABDOMINAL AORTOGRAM;  Surgeon: Elam Dutch, MD;  Location: [place] CV LAB;  Service: Cardiovascular;  Laterality: N/A;  . AMPUTATION Right 11/10/2012   Procedure: AMPUTATION FIRST and SECOND TOES Right Foot;  Surgeon: Elam Dutch, MD;  Location: [place];  Service: Vascular;  Laterality: Right;  . AMPUTATION Left 04/07/2013   Procedure: AMPUTATION DIGIT- LEFT 1ST TOE;  Surgeon: Mal Misty, MD;  Location: [place];  Service: Vascular;  Laterality: Left;  . AMPUTATION Left 04/26/2013   Procedure: Left Foot Transmetatarsal Amputation;  Surgeon: Illene Regulus  Sharol Given, MD;  Location: [place];  Service: Orthopedics;  Laterality: Left;  . AMPUTATION Left 05/14/2013   Procedure: AMPUTATION BELOW KNEE;  Surgeon: Newt Minion, MD;  Location: [place];  Service: Orthopedics;  Laterality: Left;  Left Below Knee Amputation  . AMPUTATION Left 07/23/2013   Procedure: AMPUTATION BELOW KNEE;  Surgeon: Newt Minion, MD;  Location: [place];  Service: Orthopedics;  Laterality: Left;  Left Below Knee Amputation Revision  . AMPUTATION Right 02/18/2014   Procedure: AMPUTATION BELOW KNEE;  Surgeon: Newt Minion, MD;  Location: [place];   Service: Orthopedics;  Laterality: Right;  . AMPUTATION Right 08/20/2016   Procedure: RIGHT LONG FINGER AMPUTATION;  Surgeon: Leanora Cover, MD;  Location: [place];  Service: Orthopedics;  Laterality: Right;  . AMPUTATION Right 01/11/2017   Procedure: RIGHT LONG FINGER AMPUTATION;  Surgeon: Leanora Cover, MD;  Location: [place];  Service: Orthopedics;  Laterality: Right;  . AMPUTATION Left 04/04/2017   Procedure: AMPUTATION DIGIT LEFT LONG FINGER;  Surgeon: Leanora Cover, MD;  Location: [place];  Service: Orthopedics;  Laterality: Left;  . AMPUTATION Right 06/13/2017   Procedure: INDEX FINGER RIGHT AMPUTATION;  Surgeon: Leanora Cover, MD;  Location: [place];  Service: Orthopedics;  Laterality: Right;  . AMPUTATION Left 07/29/2017   Procedure: LEFT RING FINGER AMPUTATION;  Surgeon: Leanora Cover, MD;  Location: [place];  Service: Orthopedics;  Laterality: Left;  . AORTIC ARCH ANGIOGRAPHY N/A 07/11/2017   Procedure: AORTIC ARCH ANGIOGRAPHY;  Surgeon: Elam Dutch, MD;  Location: [place] CV LAB;  Service: Cardiovascular;  Laterality: N/A;  . AV FISTULA PLACEMENT Left 03/25/2012   Procedure: ARTERIOVENOUS (AV) FISTULA CREATION;  Surgeon: Mal Misty, MD;  Location: [place];  Service: Vascular;  Laterality: Left;  . BELOW KNEE LEG AMPUTATION Left 05/14/2013   DR [name]  . BELOW KNEE LEG AMPUTATION Right 02/18/2014   dr duda  . CARDIAC CATHETERIZATION     approx 30 years ago  . CERVICAL DISC SURGERY    . EYE SURGERY Bilateral    CATARACTS  . I&D EXTREMITY Left 04/22/2013   Procedure: IRRIGATION AND DEBRIDEMENT LEFT FIRST TOE AMPUTATION WOUND ;  Surgeon: Mal Misty, MD;  Location: [place];  Service: Vascular;  Laterality: Left;  . I&D EXTREMITY Right 01/05/2014   Procedure: IRRIGATION AND DEBRIDEMENT Right Heel Ulcer;  Surgeon: Mcarthur Rossetti, MD;  Location: [place];  Service: Orthopedics;  Laterality: Right;  Surgeon Available after 5PM  . I&D EXTREMITY Left  07/29/2017   Procedure: DEBRIDEMENT WOUND;  Surgeon: Leanora Cover, MD;  Location: [place];  Service: Orthopedics;  Laterality: Left;  . INSERTION OF DIALYSIS CATHETER Right 03/19/2012   Procedure: INSERTION OF DIALYSIS CATHETER;  Surgeon: Mal Misty, MD;  Location: [place];  Service: Vascular;  Laterality: Right;  Right Internal Jugular  . LIGATION OF COMPETING BRANCHES OF ARTERIOVENOUS FISTULA Left 05/08/2012   Procedure: LIGATION OF COMPETING BRANCHES OF ARTERIOVENOUS FISTULA;  Surgeon: Mal Misty, MD;  Location: [place];  Service: Vascular;  Laterality: Left;  Ultrasound guided  . REPAIR QUADRICEPS/HAMSTRING MUSCLES Left 05/20/2014   Procedure: Left Hamstring Release;  Surgeon: Newt Minion, MD;  Location: [place];  Service: Orthopedics;  Laterality: Left;  . REVISON OF ARTERIOVENOUS FISTULA Left 01/30/2016   Procedure: CEPHALIC VEIN TURNDOWN TO LEFT UPPER ARM;  Surgeon: Elam Dutch, MD;  Location: [place];  Service: Vascular;  Laterality: Left;  . STUMP REVISION Right  05/20/2014   Procedure: Revision Right Below Knee Amputation;  Surgeon: Newt Minion, MD;  Location: [place];  Service: Orthopedics;  Laterality: Right;  . TEE WITHOUT CARDIOVERSION N/A 04/20/2013   Procedure: TRANSESOPHAGEAL ECHOCARDIOGRAM (TEE);  Surgeon: Josue Hector, MD;  Location: [facility] ENDOSCOPY;  Service: Cardiovascular;  Laterality: N/A;  . THYROIDECTOMY    . TOE AMPUTATION     D/C 04-30-13  . TRANSMETATARSAL AMPUTATION Left 12/16/2012   Procedure: TRANSMETATARSAL AMPUTATION AND VAC PLACEMENT;  Surgeon: Elam Dutch, MD;  Location: [place];  Service: Vascular;  Laterality: Left;  . UPPER EXTREMITY ANGIOGRAPHY Right 07/11/2017   Procedure: UPPER EXTREMITY ANGIOGRAPHY;  Surgeon: Elam Dutch, MD;  Location: [place] CV LAB;  Service: Cardiovascular;  Laterality: Right;   Family History  Problem Relation Age of Onset  . Diabetes Mother   . Cancer Mother        bone   . Kidney disease Father     Social History:  reports that he quit smoking about 35 years ago. His smoking use included cigarettes. He has never used smokeless tobacco. He reports that he does not drink alcohol or use drugs.  ROS: As per HPI otherwise negative.  Physical Exam: Vitals:   08/01/17 1200 08/01/17 1300 08/01/17 1330 08/01/17 1400  BP: 103/86 116/78 128/73 119/75  Pulse: 98  92 94  Resp: '17 19 13 15  ' Temp:      TempSrc:      SpO2: 100%  100% 100%  Weight:         General: Pleasant, elderly man. NAD. Lying flat without nasal oxygen. Head: Normocephalic, atraumatic, sclera non-icteric, mucus membranes are moist. Neck: Supple without lymphadenopathy/masses. JVD not elevated. Lungs: Clear bilaterally to auscultation without wheezes, rales, or rhonchi.  Heart: RRR with normal S1, S2. No murmurs, rubs, or gallops appreciated. Abdomen: Soft, non-tender, non-distended with normoactive bowel sounds. No rebound/guarding. No obvious abdominal masses. Musculoskeletal:  Strength and tone appear normal for age. Lower extremities: B BKA without stump edema. L hand wrapped s/p recent finger amputation; missing digits on B hands. Neuro: Alert and oriented X 3. Moves all extremities spontaneously. Psych:  Responds to questions appropriately with a normal affect. Dialysis Access: TDC in R chest; no erythema  Allergies  Allergen Reactions  . Bee Venom Anaphylaxis  . Ivp Dye [Iodinated Diagnostic Agents] Hives  . Lisinopril Cough  . Morphine And Related Hives and Other (See Comments)    Bradycardia    Prior to Admission medications   Medication Sig Start Date End Date Taking? Authorizing Provider  aspirin EC 81 MG tablet Take 81 mg by mouth daily.   Yes [provider]  atorvastatin (LIPITOR) 10 MG tablet TAKE 1 TABLET (10 MG TOTAL) BY MOUTH DAILY. 03/17/17  Yes [place], Betty G, MD  lanthanum (FOSRENOL) 1000 MG chewable tablet Chew 1,000 mg by mouth daily as needed.  12/16/16  Yes [provider]   levothyroxine (SYNTHROID, LEVOTHROID) 150 MCG tablet TAKE 1/2 TABLET BY MOUTH ON MONDAYS AND 1 TABLET BY MOUTH THE REST OF THE WEEK. 06/17/17  Yes [place], Betty G, MD  multivitamin (RENA-VIT) TABS tablet Take 1 tablet by mouth 4 (four) times a week.    Yes [provider]  naproxen sodium (ALEVE) 220 MG tablet Take 220 mg by mouth daily as needed (pain).   Yes [provider]  ondansetron (ZOFRAN) 4 MG tablet Take 1 tablet (4 mg total) by mouth every 8 (eight) hours as needed for  nausea or vomiting. 07/29/17  Yes Leanora Cover, MD  oxyCODONE (ROXICODONE) 5 MG immediate release tablet 1-2 p.o. every 6 hours as needed pain 07/29/17  Yes Leanora Cover, MD  oxyCODONE-acetaminophen (PERCOCET) 5-325 MG tablet 1-2 tabs po q6 hours prn pain Patient taking differently: Take 1-2 tablets by mouth every 6 (six) hours as needed for moderate pain or severe pain. 1-2 tabs po q6 hours prn pain 04/04/17  Yes Leanora Cover, MD  Continuous Blood Gluc Sensor (FREESTYLE LIBRE 14 DAY SENSOR) MISC USE DAILY*NOT COVERED* 06/27/17   [place], Betty G, MD  Continuous Blood Gluc Sensor (FREESTYLE LIBRE 14 DAY SENSOR) MISC USE DAILY*NOT COVERED* 07/15/17   [place], Betty G, MD   Current Facility-Administered Medications  Medication Dose Route Frequency Provider Last Rate Last Dose  . ondansetron (ZOFRAN) injection 4 mg  4 mg Intravenous Q6H PRN Caren Griffins, MD       Current Outpatient Medications  Medication Sig Dispense Refill  . aspirin EC 81 MG tablet Take 81 mg by mouth daily.    Marland Kitchen atorvastatin (LIPITOR) 10 MG tablet TAKE 1 TABLET (10 MG TOTAL) BY MOUTH DAILY. 90 tablet 1  . lanthanum (FOSRENOL) 1000 MG chewable tablet Chew 1,000 mg by mouth daily as needed.   6  . levothyroxine (SYNTHROID, LEVOTHROID) 150 MCG tablet TAKE 1/2 TABLET BY MOUTH ON MONDAYS AND 1 TABLET BY MOUTH THE REST OF THE WEEK. 90 tablet 1  . multivitamin (RENA-VIT) TABS tablet Take 1 tablet by mouth 4 (four) times a week.     . naproxen  sodium (ALEVE) 220 MG tablet Take 220 mg by mouth daily as needed (pain).    . ondansetron (ZOFRAN) 4 MG tablet Take 1 tablet (4 mg total) by mouth every 8 (eight) hours as needed for nausea or vomiting. 20 tablet 0  . oxyCODONE (ROXICODONE) 5 MG immediate release tablet 1-2 p.o. every 6 hours as needed pain 20 tablet 0  . oxyCODONE-acetaminophen (PERCOCET) 5-325 MG tablet 1-2 tabs po q6 hours prn pain (Patient taking differently: Take 1-2 tablets by mouth every 6 (six) hours as needed for moderate pain or severe pain. 1-2 tabs po q6 hours prn pain) 20 tablet 0  . Continuous Blood Gluc Sensor (FREESTYLE LIBRE 14 DAY SENSOR) MISC USE DAILY*NOT COVERED* 1 each 1  . Continuous Blood Gluc Sensor (FREESTYLE LIBRE 14 DAY SENSOR) MISC USE DAILY*NOT COVERED* 2 each 11   Labs: Basic Metabolic Panel: Recent Labs  Lab 07/29/17 1448 08/01/17 0935  NA 140 144  K 3.7 4.1  CL 98 99  CO2  --  28  GLUCOSE 100* 140*  BUN 20 27*  CREATININE 5.40* 6.64*  CALCIUM  --  8.7*   Liver Function Tests: Recent Labs  Lab 08/01/17 1125  AST 21  ALT 6  ALKPHOS 131*  BILITOT 0.9  PROT 7.8  ALBUMIN 2.9*   Recent Labs  Lab 08/01/17 1125  LIPASE 23   CBC: Recent Labs  Lab 07/29/17 1448 08/01/17 0935  WBC  --  9.1  HGB 12.6* 12.6*  HCT 37.0* 43.4  MCV  --  85.3  PLT  --  352   Studies/Results: Ct Abdomen Pelvis Wo Contrast  Addendum Date: 08/01/2017   ADDENDUM REPORT: 08/01/2017 12:02 ADDENDUM: These results were called by telephone at the time of interpretation on 08/01/2017 prior to signing off the report at approximately 11:50 a.m. to Dr. Lajean Saver , who verbally acknowledged these results. Electronically Signed   By: Inge Rise  M.D.   On: 08/01/2017 12:02   Result Date: 08/01/2017 CLINICAL DATA:  Abdominal pain with nausea and vomiting beginning today. EXAM: CT ABDOMEN AND PELVIS WITHOUT CONTRAST TECHNIQUE: Multidetector CT imaging of the abdomen and pelvis was performed following the  standard protocol without IV contrast. COMPARISON:  CT abdomen and pelvis 02/24/2013. FINDINGS: Lower chest: Innumerable pulmonary nodules are seen in the lung bases and are new since the prior CT scan index nodules include a 0.7 cm nodule in the left lower lobe on image 2 and a second 0.7 cm nodule in the left lower lobe on image 7. Small bilateral pleural effusions are present, larger on the left. No pericardial effusion. Extensive calcific aortic and coronary atherosclerosis is noted. Hepatobiliary: Portal venous gas is identified. There is a low attenuating lesion inferior right hepatic lobe measuring 1.8 cm on image 29. Visualization of the liver is limited due to streak artifact from a metallic device along the right flank. Gallbladder and biliary tree are unremarkable. Pancreas: Severely atrophic as seen on the prior CT. No focal lesion or biliary ductal dilatation. Spleen: Normal in size.  No focal lesion. Adrenals/Urinary Tract: Marked renal atrophy is seen bilaterally. No focal renal lesion. Urinary bladder stone is unchanged. The adrenal glands appear normal. Stomach/Bowel: Pneumatosis is seen in the stomach wall. The stomach is moderately dilated. Focal wall thickening is seen in the mid descending colon. Focal wall seen on image 54 series 3 thickening of the ascending colon as is highly suspicious for carcinoma. There is scattered diverticulosis. Vascular/Lymphatic: Extensive atherosclerotic vascular disease is identified. No aneurysm. No lymphadenopathy. Reproductive: Prostate is enlarged. Other: There is a small volume of abdominal ascites. Nodularity of the omentum is identified and most conspicuous in the left abdomen. An omental lymph node measuring 1.7 cm is seen on image 59. No free intraperitoneal air is seen. Musculoskeletal: No fracture or focal lesion. IMPRESSION: Gastric pneumatosis and portal venous gas can be seen in infection of the stomach wall by gas-forming organisms and presumably  gastric ischemia. Findings most consistent with carcinoma of the ascending colon with innumerable pulmonary metastases, omental metastases and a single liver metastasis seen on this uninfused exam. Small bilateral pleural effusions. Extensive atherosclerosis. Electronically Signed: By: Inge Rise M.D. On: 08/01/2017 11:50   Dg Chest 2 View  Result Date: 08/01/2017 CLINICAL DATA:  Vomiting last night.  Dialysis patient.  Chest pain. EXAM: CHEST - 2 VIEW COMPARISON:  07/14/2017 FINDINGS: Chronic elevation of the right hemidiaphragm. Dialysis catheter tip in the right atrium. Chronic cardiomegaly and aortic atherosclerosis. Patchy density in the lungs that could be due to developing edema or aspiration. No dense consolidation or collapse. Probable pleural effusion on the right. IMPRESSION: Chronic elevation of the right hemidiaphragm. Patchy density in the lungs left more than right that could be due to developing edema or aspiration. Electronically Signed   By: Nelson Chimes M.D.   On: 08/01/2017 09:33   Dialysis Orders:  MWF at [facility] 4hr, 400/800, EDW 66.5kgh, 2K/2Ca, UF#4/linear Na, TDC, Hep 2000 + 1000 mid-run bolus - Hectoral 36mg IV q HD - Mircera 363m IV q 2 weeks  Assessment/Plan: 1.  Hematemesis: CT abdomen with gastric pneumatosis, likely ischemic in nature. He has known diffuse severe vascular disease. NPO for now, surgery consulted. 2.  Presumed metastatic colon cancer: Based on CT findings (7/19), suspect primary colon cancer with mets to lungs & liver.  3.  ESRD:  Usual MWF schedule. Got half his HD today. At this point,  no need for further HD today which would likely worsen any evolving abdominal ischemia. Will plan to monitor labs and vitals with next HD to be Monday, 7/22. 4.  Hypertension/volume: BP ok, not hypotensive at this time. No edema. 5.  Anemia: Hgb remains > 12 despite hematemesis. Will follow, transfuse prn. 6.  Metabolic bone disease: Ca ok. NPO for now, monitor  labs. 7.  T2DM: Per primary. 8.  Hypothyroidism: Per primary. 9.  Dispo: Unfortunately, he now has two new medical issues with very poor prognoses. Both problems have been discussed with patient, he and his family appear to be processing the news. Will need to continue conversations as to whether he would want to be aggressive in treatment or not, whether this will change code status, dialysis, etc.   Veneta Penton, PA-C 08/01/2017, 2:21 PM  [place] Pager: [phone]

## 2017-08-01 NOTE — ED Notes (Signed)
Admitting provider at bedside.

## 2017-08-01 NOTE — Progress Notes (Signed)
Patient has a continuous CBG monitor Elenor Legato) that he uses at all times to check his BG. Patient stated that he does not want finger sticks. [facility] was paged and notified. Currently waiting to get an order to use patient's device.
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
## 2017-08-01 NOTE — ED Notes (Signed)
Shea Stakes, RN accepts report.

## 2017-08-01 NOTE — Progress Notes (Signed)
Pharmacy Antibiotic Note  Jack [name] is a 72 y.o. male admitted on 08/01/2017 with gastric pneumatosis.  Pharmacy has been consulted for zosyn dosing. Pt is afebrile and WBC is WNL. Pt with history of ESRD on HD.   Plan: Zosyn 3.375gm IV Q12H (4 hr inf) F/u renal fxn, C&S, clinical status and LOT *Pharmacy will sign off as no further dose adjustments anticipated. Thank you for the consult!  Weight: 148 lb (67.1 kg)  Temp (24hrs), Avg:98 F (36.7 C), Min:98 F (36.7 C), Max:98 F (36.7 C)  Recent Labs  Lab 07/29/17 1448 08/01/17 0935  WBC  --  9.1  CREATININE 5.40* 6.64*    Estimated Creatinine Clearance: 9.3 mL/min (A) (by C-G formula based on SCr of 6.64 mg/dL (H)).    Allergies  Allergen Reactions  . Bee Venom Anaphylaxis  . Ivp Dye [Iodinated Diagnostic Agents] Hives  . Lisinopril Cough  . Morphine And Related Hives and Other (See Comments)    Bradycardia     Antimicrobials this admission: Zosyn 7/19>>  Dose adjustments this admission: N/A  Microbiology results: Pending  Thank you for allowing pharmacy to be a part of this patient's care.  Jack [name], Jack [name] 08/01/2017 2:51 PM

## 2017-08-01 NOTE — ED Triage Notes (Signed)
PT had 3rd and 4th fingers on left hand amputated Tuesday. PT is a dialysis PT and has been feeling nauseated since anesthesia. PT started vomiting last night. Went to dialysis this morning and developed chest pain before he dialyzed. PT reports chest pain is intermittent. PT reports tightness with SOB.   No chest pain during triage. No SOB during triage.

## 2017-08-02 DIAGNOSIS — I10 Essential (primary) hypertension: Secondary | ICD-10-CM

## 2017-08-02 DIAGNOSIS — C189 Malignant neoplasm of colon, unspecified: Secondary | ICD-10-CM | POA: Diagnosis present

## 2017-08-02 LAB — COMPREHENSIVE METABOLIC PANEL
ALBUMIN: 2.5 g/dL — AB (ref 3.5–5.0)
AST: 22 U/L (ref 15–41)
Alkaline Phosphatase: 108 U/L (ref 38–126)
Anion gap: 15 (ref 5–15)
BILIRUBIN TOTAL: 1.1 mg/dL (ref 0.3–1.2)
BUN: 39 mg/dL — AB (ref 8–23)
CHLORIDE: 102 mmol/L (ref 98–111)
CO2: 26 mmol/L (ref 22–32)
CREATININE: 7.68 mg/dL — AB (ref 0.61–1.24)
Calcium: 8.5 mg/dL — ABNORMAL LOW (ref 8.9–10.3)
GFR calc Af Amer: 7 mL/min — ABNORMAL LOW (ref >60)
GFR, EST NON AFRICAN AMERICAN: 6 mL/min — AB (ref >60)
GLUCOSE: 107 mg/dL — AB (ref 70–99)
Potassium: 4.6 mmol/L (ref 3.5–5.1)
Sodium: 143 mmol/L (ref 135–145)
TOTAL PROTEIN: 6.6 g/dL (ref 6.5–8.1)

## 2017-08-02 LAB — CBC
HEMATOCRIT: 36 % — AB (ref 39.0–52.0)
Hemoglobin: 10.5 g/dL — ABNORMAL LOW (ref 13.0–17.0)
MCH: 24.6 pg — AB (ref 26.0–34.0)
MCHC: 29.2 g/dL — ABNORMAL LOW (ref 30.0–36.0)
MCV: 84.3 fL (ref 78.0–100.0)
Platelets: 297 10*3/uL (ref 150–400)
RBC: 4.27 MIL/uL (ref 4.22–5.81)
RDW: 17.2 % — ABNORMAL HIGH (ref 11.5–15.5)
WBC: 13.6 10*3/uL — ABNORMAL HIGH (ref 4.0–10.5)

## 2017-08-02 MED ORDER — CHLORHEXIDINE GLUCONATE CLOTH 2 % EX PADS
6.0000 | MEDICATED_PAD | Freq: Every day | CUTANEOUS | Status: DC
  Administered 2017-08-02 – 2017-08-06 (×5): 6 via TOPICAL

## 2017-08-02 MED ORDER — DEXTROSE 5 % IV SOLN
INTRAVENOUS | Status: DC
  Administered 2017-08-02 – 2017-08-08 (×7): via INTRAVENOUS

## 2017-08-02 NOTE — Progress Notes (Signed)
Pt's CBG trending down, MD notified. Received orders to initiate 5% dextrose infusion @ 42ml/hr and to d/c ssi. Orders adjusted. VSS, will continue to monitor.
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
## 2017-08-02 NOTE — Progress Notes (Signed)
[facility] Progress Note   Subjective: Awake, alert, feels better since NGT dc'd. No C/Os at present.     Objective Vitals:   08/02/17 0700 08/02/17 0800 08/02/17 0900 08/02/17 1000  BP: (!) 149/88 125/78 139/84 125/76  Pulse: 81 80 81 79  Resp: 19 13 14 14   Temp:  98.1 F (36.7 C)    TempSrc:  Oral    SpO2: 99% 99% 100% 99%  Weight:       Physical Exam General: Pleasant elderly male in NAD Heart: S1,S2 RRR Lungs: CTAB A/P Abdomen: Active BS, NT Extremities: Bilateral BKA no stump edema. L hand with recent middle finger amputation. Finger amputations R hand.  Dialysis Access: [facility] drsg CDI.    Additional Objective Labs: Basic Metabolic Panel: Recent Labs  Lab 07/29/17 1448 08/01/17 0935 08/02/17 0256  NA 140 144 143  K 3.7 4.1 4.6  CL 98 99 102  CO2  --  28 26  GLUCOSE 100* 140* 107*  BUN 20 27* 39*  CREATININE 5.40* 6.64* 7.68*  CALCIUM  --  8.7* 8.5*   Liver Function Tests: Recent Labs  Lab 08/01/17 1125 08/02/17 0256  AST 21 22  ALT 6 <5  ALKPHOS 131* 108  BILITOT 0.9 1.1  PROT 7.8 6.6  ALBUMIN 2.9* 2.5*   Recent Labs  Lab 08/01/17 1125  LIPASE 23   CBC: Recent Labs  Lab 07/29/17 1448 08/01/17 0935 08/02/17 0256  WBC  --  9.1 13.6*  HGB 12.6* 12.6* 10.5*  HCT 37.0* 43.4 36.0*  MCV  --  85.3 84.3  PLT  --  352 297   Blood Culture    Component Value Date/Time   SDES BLOOD RIGHT WRIST 04/07/2014 0340   SPECREQUEST BOTTLES DRAWN AEROBIC ONLY 3 CC 04/07/2014 0340   CULT  04/07/2014 0340    NO GROWTH 5 DAYS Performed at [place] 04/13/2014 FINAL 04/07/2014 0340    Cardiac Enzymes: No results for input(s): CKTOTAL, CKMB, CKMBINDEX, TROPONINI in the last 168 hours. CBG: Recent Labs  Lab 07/29/17 1348  GLUCAP 111*   Iron Studies: No results for input(s): IRON, TIBC, TRANSFERRIN, FERRITIN in the last 72 hours. @lablastinr3 @ Studies/Results: Ct Abdomen Pelvis Wo Contrast  Addendum  Date: 08/01/2017   ADDENDUM REPORT: 08/01/2017 12:02 ADDENDUM: These results were called by telephone at the time of interpretation on 08/01/2017 prior to signing off the report at approximately 11:50 a.m. to Dr. Lajean Saver , who verbally acknowledged these results. Electronically Signed   By: Inge Rise M.D.   On: 08/01/2017 12:02   Result Date: 08/01/2017 CLINICAL DATA:  Abdominal pain with nausea and vomiting beginning today. EXAM: CT ABDOMEN AND PELVIS WITHOUT CONTRAST TECHNIQUE: Multidetector CT imaging of the abdomen and pelvis was performed following the standard protocol without IV contrast. COMPARISON:  CT abdomen and pelvis 02/24/2013. FINDINGS: Lower chest: Innumerable pulmonary nodules are seen in the lung bases and are new since the prior CT scan index nodules include a 0.7 cm nodule in the left lower lobe on image 2 and a second 0.7 cm nodule in the left lower lobe on image 7. Small bilateral pleural effusions are present, larger on the left. No pericardial effusion. Extensive calcific aortic and coronary atherosclerosis is noted. Hepatobiliary: Portal venous gas is identified. There is a low attenuating lesion inferior right hepatic lobe measuring 1.8 cm on image 29. Visualization of the liver is limited due to streak artifact from a metallic device  along the right flank. Gallbladder and biliary tree are unremarkable. Pancreas: Severely atrophic as seen on the prior CT. No focal lesion or biliary ductal dilatation. Spleen: Normal in size.  No focal lesion. Adrenals/Urinary Tract: Marked renal atrophy is seen bilaterally. No focal renal lesion. Urinary bladder stone is unchanged. The adrenal glands appear normal. Stomach/Bowel: Pneumatosis is seen in the stomach wall. The stomach is moderately dilated. Focal wall thickening is seen in the mid descending colon. Focal wall seen on image 54 series 3 thickening of the ascending colon as is highly suspicious for carcinoma. There is scattered  diverticulosis. Vascular/Lymphatic: Extensive atherosclerotic vascular disease is identified. No aneurysm. No lymphadenopathy. Reproductive: Prostate is enlarged. Other: There is a small volume of abdominal ascites. Nodularity of the omentum is identified and most conspicuous in the left abdomen. An omental lymph node measuring 1.7 cm is seen on image 59. No free intraperitoneal air is seen. Musculoskeletal: No fracture or focal lesion. IMPRESSION: Gastric pneumatosis and portal venous gas can be seen in infection of the stomach wall by gas-forming organisms and presumably gastric ischemia. Findings most consistent with carcinoma of the ascending colon with innumerable pulmonary metastases, omental metastases and a single liver metastasis seen on this uninfused exam. Small bilateral pleural effusions. Extensive atherosclerosis. Electronically Signed: By: Inge Rise M.D. On: 08/01/2017 11:50   Dg Chest 2 View  Result Date: 08/01/2017 CLINICAL DATA:  Vomiting last night.  Dialysis patient.  Chest pain. EXAM: CHEST - 2 VIEW COMPARISON:  07/14/2017 FINDINGS: Chronic elevation of the right hemidiaphragm. Dialysis catheter tip in the right atrium. Chronic cardiomegaly and aortic atherosclerosis. Patchy density in the lungs that could be due to developing edema or aspiration. No dense consolidation or collapse. Probable pleural effusion on the right. IMPRESSION: Chronic elevation of the right hemidiaphragm. Patchy density in the lungs left more than right that could be due to developing edema or aspiration. Electronically Signed   By: Nelson Chimes M.D.   On: 08/01/2017 09:33   Medications: . famotidine (PEPCID) IV Stopped (08/02/17 0947)  . piperacillin-tazobactam (ZOSYN)  IV Stopped (08/02/17 0722)   . insulin aspart  0-9 Units Subcutaneous Q4H  . levothyroxine  75 mcg Intravenous Daily     Dialysis Orders:  MWF at [facility] 4hr, 400/800, EDW 66.5kgh, 2K/2Ca, UF#4/linear Na, TDC, Hep 2000 + 1000 mid-run  bolus - Hectoral 52mcg IV q HD - Mircera 55mcg IV q 2 weeks  Assessment/Plan: 1.  Hematemesis: CT abdomen with gastric pneumatosis, likely ischemic in nature. He has known diffuse severe vascular disease. NPO for now, surgery consulted. Not deemed surgical candidate D/T metastatic colon cancer. Supportive treatment. 2.  Presumed metastatic colon cancer: Based on CT findings (7/19), suspect primary colon cancer with mets to lungs & liver.  3.  ESRD:  Usual MWF schedule. Got half his HD today. At this point, no need for further HD today which would likely worsen any evolving abdominal ischemia. Will plan to monitor labs and vitals with next HD to be Monday, 7/22. 4.  Hypertension/volume: BP ok, not hypotensive at this time. No edema. 5.  Anemia: Hgb remains > 12 despite hematemesis. Will follow, transfuse prn. 6.  Metabolic bone disease: Ca ok. NPO for now, monitor labs. 7.  T2DM: Per primary. 8.  Hypothyroidism: Per primary. 9.  Dispo: Unfortunately, he now has two new medical issues with very poor prognoses. Both problems have been discussed with patient, he and his family appear to be processing the news. Will need to continue  conversations as to whether he would want to be aggressive in treatment or not, whether this will change code status, dialysis, etc.    Briel Gallicchio H. Felton Buczynski NP-C 08/02/2017, 10:58 AM  [employer] [phone]

## 2017-08-02 NOTE — Progress Notes (Signed)
Patient ID: Jack [name], male   DOB: 1945/08/23, 72 y.o.   MRN: [id number]       Subjective: Pt feels well this morning.  Still with no abdominal pain.  Some flatus.  NGT with essentially only 170cc of output since placement.   Objective: Vital signs in last 24 hours: Temp:  [98.1 F (36.7 C)] 98.1 F (36.7 C) (07/20 0800) Pulse Rate:  [59-98] 80 (07/20 0800) Resp:  [8-19] 13 (07/20 0800) BP: (33-149)/(23-89) 125/78 (07/20 0800) SpO2:  [95 %-100 %] 99 % (07/20 0800) Last BM Date: (PTA)  Intake/Output from previous day: 07/19 0701 - 07/20 0700 In: 95.3 [IV Piggyback:95.3] Out: 170 [Emesis/NG output:170] Intake/Output this shift: Total I/O In: 4.8 [IV Piggyback:4.8] Out: -   PE: Gen: NAD Heart: regular Lungs: CTAB Abd: soft, NT, ND, +BS, NGT with minimal old output.  Lab Results:  Recent Labs    08/01/17 0935 08/02/17 0256  WBC 9.1 13.6*  HGB 12.6* 10.5*  HCT 43.4 36.0*  PLT 352 297   BMET Recent Labs    08/01/17 0935 08/02/17 0256  NA 144 143  K 4.1 4.6  CL 99 102  CO2 28 26  GLUCOSE 140* 107*  BUN 27* 39*  CREATININE 6.64* 7.68*  CALCIUM 8.7* 8.5*   PT/INR No results for input(s): LABPROT, INR in the last 72 hours. CMP     Component Value Date/Time   NA 143 08/02/2017 0256   NA 139 09/29/2012 0916   K 4.6 08/02/2017 0256   K 4.0 09/29/2012 0916   CL 102 08/02/2017 0256   CL 97 (L) 06/10/2012 1050   CO2 26 08/02/2017 0256   CO2 31 (H) 09/29/2012 0916   GLUCOSE 107 (H) 08/02/2017 0256   GLUCOSE 265 (H) 09/29/2012 0916   GLUCOSE 244 (H) 06/10/2012 1050   BUN 39 (H) 08/02/2017 0256   BUN 29.0 (H) 09/29/2012 0916   CREATININE 7.68 (H) 08/02/2017 0256   CREATININE 7.3 (HH) 09/29/2012 0916   CALCIUM 8.5 (L) 08/02/2017 0256   CALCIUM 9.3 09/29/2012 0916   PROT 6.6 08/02/2017 0256   PROT 7.8 09/29/2012 0916   ALBUMIN 2.5 (L) 08/02/2017 0256   ALBUMIN 3.6 09/29/2012 0916   AST 22 08/02/2017 0256   AST 13 09/29/2012 0916   ALT <5  08/02/2017 0256   ALT 15 09/29/2012 0916   ALKPHOS 108 08/02/2017 0256   ALKPHOS 76 09/29/2012 0916   BILITOT 1.1 08/02/2017 0256   BILITOT 0.39 09/29/2012 0916   GFRNONAA 6 (L) 08/02/2017 0256   GFRAA 7 (L) 08/02/2017 0256   Lipase     Component Value Date/Time   LIPASE 23 08/01/2017 1125       Studies/Results: Ct Abdomen Pelvis Wo Contrast  Addendum Date: 08/01/2017   ADDENDUM REPORT: 08/01/2017 12:02 ADDENDUM: These results were called by telephone at the time of interpretation on 08/01/2017 prior to signing off the report at approximately 11:50 a.m. to Dr. Lajean Saver , who verbally acknowledged these results. Electronically Signed   By: Inge Rise M.D.   On: 08/01/2017 12:02   Result Date: 08/01/2017 CLINICAL DATA:  Abdominal pain with nausea and vomiting beginning today. EXAM: CT ABDOMEN AND PELVIS WITHOUT CONTRAST TECHNIQUE: Multidetector CT imaging of the abdomen and pelvis was performed following the standard protocol without IV contrast. COMPARISON:  CT abdomen and pelvis 02/24/2013. FINDINGS: Lower chest: Innumerable pulmonary nodules are seen in the lung bases and are new since the prior CT scan index nodules include a  0.7 cm nodule in the left lower lobe on image 2 and a second 0.7 cm nodule in the left lower lobe on image 7. Small bilateral pleural effusions are present, larger on the left. No pericardial effusion. Extensive calcific aortic and coronary atherosclerosis is noted. Hepatobiliary: Portal venous gas is identified. There is a low attenuating lesion inferior right hepatic lobe measuring 1.8 cm on image 29. Visualization of the liver is limited due to streak artifact from a metallic device along the right flank. Gallbladder and biliary tree are unremarkable. Pancreas: Severely atrophic as seen on the prior CT. No focal lesion or biliary ductal dilatation. Spleen: Normal in size.  No focal lesion. Adrenals/Urinary Tract: Marked renal atrophy is seen bilaterally. No  focal renal lesion. Urinary bladder stone is unchanged. The adrenal glands appear normal. Stomach/Bowel: Pneumatosis is seen in the stomach wall. The stomach is moderately dilated. Focal wall thickening is seen in the mid descending colon. Focal wall seen on image 54 series 3 thickening of the ascending colon as is highly suspicious for carcinoma. There is scattered diverticulosis. Vascular/Lymphatic: Extensive atherosclerotic vascular disease is identified. No aneurysm. No lymphadenopathy. Reproductive: Prostate is enlarged. Other: There is a small volume of abdominal ascites. Nodularity of the omentum is identified and most conspicuous in the left abdomen. An omental lymph node measuring 1.7 cm is seen on image 59. No free intraperitoneal air is seen. Musculoskeletal: No fracture or focal lesion. IMPRESSION: Gastric pneumatosis and portal venous gas can be seen in infection of the stomach wall by gas-forming organisms and presumably gastric ischemia. Findings most consistent with carcinoma of the ascending colon with innumerable pulmonary metastases, omental metastases and a single liver metastasis seen on this uninfused exam. Small bilateral pleural effusions. Extensive atherosclerosis. Electronically Signed: By: Inge Rise M.D. On: 08/01/2017 11:50   Dg Chest 2 View  Result Date: 08/01/2017 CLINICAL DATA:  Vomiting last night.  Dialysis patient.  Chest pain. EXAM: CHEST - 2 VIEW COMPARISON:  07/14/2017 FINDINGS: Chronic elevation of the right hemidiaphragm. Dialysis catheter tip in the right atrium. Chronic cardiomegaly and aortic atherosclerosis. Patchy density in the lungs that could be due to developing edema or aspiration. No dense consolidation or collapse. Probable pleural effusion on the right. IMPRESSION: Chronic elevation of the right hemidiaphragm. Patchy density in the lungs left more than right that could be due to developing edema or aspiration. Electronically Signed   By: Nelson Chimes  M.D.   On: 08/01/2017 09:33    Anti-infectives: Anti-infectives (From admission, onward)   Start     Dose/Rate Route Frequency Ordered Stop   08/02/17 0300  piperacillin-tazobactam (ZOSYN) IVPB 3.375 g     3.375 g 12.5 mL/hr over 240 Minutes Intravenous Every 12 hours 08/01/17 1454     08/01/17 1500  piperacillin-tazobactam (ZOSYN) IVPB 3.375 g     3.375 g 100 mL/hr over 30 Minutes Intravenous  Once 08/01/17 1453 08/01/17 1945       Assessment/Plan Gastric pneumatosis -no abdominal pain still. -DC NGT and keep NPO today except a few ice chips.  If he continues to do well by tomorrow, his diet can be advanced as he tolerates -even if he were to worsen, he is not a surgical candidate in the setting of his presumed metastatic colon cancer.  -when patient in HD, it is very important that his blood pressure stay stable and not become hypotensive.  This decrease in blood perfusion could easily make him gastric perfusion worse and potentially worsen his gastric  ischemia.  Presumed colonic neoplasm with carcinomatosis, liver lesions, and pulmonary lesions -per medicine.    ESRD HTN DM PVD/PAD H/O MM  FEN - NPO x few ice chips today VTE - SCDs ID - empirically start zosyn, per pharmacy  Dispo: unfortunately the patient is not a surgical candidate for intervention if he were to worsen.  Therefore, we will defer further care to the medical service.  We would recommend NPO today after NGT removed and if he continues to do well, then his diet can be slowly adv as tolerates starting tomorrow.  We will sign off.   LOS: 1 day    Henreitta Cea , [facility] 08/02/2017, 10:17 AM Pager: [phone]
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
## 2017-08-03 DIAGNOSIS — R52 Pain, unspecified: Secondary | ICD-10-CM

## 2017-08-03 DIAGNOSIS — Z515 Encounter for palliative care: Secondary | ICD-10-CM

## 2017-08-03 DIAGNOSIS — Z7189 Other specified counseling: Secondary | ICD-10-CM

## 2017-08-03 LAB — BASIC METABOLIC PANEL
Anion gap: 19 — ABNORMAL HIGH (ref 5–15)
BUN: 49 mg/dL — AB (ref 8–23)
CO2: 23 mmol/L (ref 22–32)
Calcium: 8.4 mg/dL — ABNORMAL LOW (ref 8.9–10.3)
Chloride: 100 mmol/L (ref 98–111)
Creatinine, Ser: 8.8 mg/dL — ABNORMAL HIGH (ref 0.61–1.24)
GFR calc Af Amer: 6 mL/min — ABNORMAL LOW (ref >60)
GFR, EST NON AFRICAN AMERICAN: 5 mL/min — AB (ref >60)
Glucose, Bld: 99 mg/dL (ref 70–99)
Potassium: 4.8 mmol/L (ref 3.5–5.1)
SODIUM: 142 mmol/L (ref 135–145)

## 2017-08-03 LAB — CBC
HCT: 34.5 % — ABNORMAL LOW (ref 39.0–52.0)
Hemoglobin: 10.2 g/dL — ABNORMAL LOW (ref 13.0–17.0)
MCH: 24.9 pg — AB (ref 26.0–34.0)
MCHC: 29.6 g/dL — AB (ref 30.0–36.0)
MCV: 84.1 fL (ref 78.0–100.0)
PLATELETS: 267 10*3/uL (ref 150–400)
RBC: 4.1 MIL/uL — ABNORMAL LOW (ref 4.22–5.81)
RDW: 17.2 % — ABNORMAL HIGH (ref 11.5–15.5)
WBC: 11.7 10*3/uL — ABNORMAL HIGH (ref 4.0–10.5)

## 2017-08-03 MED ORDER — OXYCODONE-ACETAMINOPHEN 5-325 MG PO TABS
1.0000 | ORAL_TABLET | Freq: Four times a day (QID) | ORAL | Status: DC | PRN
  Administered 2017-08-03 (×3): 2 via ORAL
  Administered 2017-08-04 – 2017-08-06 (×2): 1 via ORAL
  Administered 2017-08-07 – 2017-08-08 (×4): 2 via ORAL
  Filled 2017-08-03 (×5): qty 2
  Filled 2017-08-03 (×2): qty 1
  Filled 2017-08-03 (×3): qty 2

## 2017-08-03 MED ORDER — OXYCODONE-ACETAMINOPHEN 5-325 MG PO TABS
1.0000 | ORAL_TABLET | Freq: Once | ORAL | Status: AC
  Administered 2017-08-03: 1 via ORAL
  Filled 2017-08-03: qty 1

## 2017-08-03 MED ORDER — CHLORHEXIDINE GLUCONATE CLOTH 2 % EX PADS: 6.0000 | MEDICATED_PAD | Freq: Every day | CUTANEOUS | Status: DC

## 2017-08-03 NOTE — Progress Notes (Signed)
Patient ID: Jack [name], male   DOB: 19-Nov-1945, 72 y.o.   MRN: [id number]   IR aware of bx request  Dr Annamaria Boots has approved biopsy IR PA will see in am for biopsy Mon or Tues

## 2017-08-03 NOTE — Consult Note (Signed)
[facility] Gastroenterology Consultation Note  Referring Provider:  Dr. Estill Cotta [facility]) Primary Care Physician:  Martinique, Betty G, MD  Reason for Consultation:  Abnormal imaging  HPI: Jack [name] is a 72 y.o. male admitted with epigastric discomfort and abnormal imaging.  Suspected colon lesion and multiple metastases seen on CT scan (see below for details).  Had some nausea and vomiting with blood(?) few days ago after anesthesia for injury and poor healing fingers left hand; CT also showed gastric wall pneumatosis.  Patient has had some upper abdominal pain which is improving; he takes narcotics for phantom pains, and has had longterm intermittent constipation.  No blood in stool.  Weight loss about 15 lbs.  Last colonoscopy over 10 years ago.  No family history colon cancer, liver cancer, pancreatic cancer, gastric cancer.   Past Medical History:  Diagnosis Date  . Anemia   . Arthritis   . Asthma   . Contracture of joint    left knee  . Decubitus ulcer of sacral region, stage 2 03/07/2014  . Diabetes mellitus with peripheral vascular disease ([place])   . End-stage renal disease on hemodialysis [facility])    Started HD March 2014.  Cause of ESRD was DM.  Gets HD at [facility] on [address] on MWF schedule.   Marland Kitchen ESRD on hemodialysis ([place])   . Gangrene ([place])    right BKA  . Gangrene ([place])    right index finger  . Gangrene of foot ([place])   . Glaucoma   . Glaucoma 03/07/2014  . Heart murmur   . History of MRSA infection 04/22/2013   Bacteremia assoc w L foot wound infection Mar 2015   . Hyperparathyroidism, secondary renal ([place])   . Hypertension   . Hypothyroidism   . MRSA bacteremia   . Multiple myeloma, without mention of having achieved remission 03/30/2012   Cytogenetic neg on 03/23/2012. Pt states he DOES NOT have cancer  . Necrosis ([place])    left long finger  . Peripheral arterial disease ([place])   . Peripheral vascular disease, unspecified ([place]) 11/19/2012   In the past had R foot  toe amps then R TMA. In 2015 had left foot toe amp > then TMA >then L BKA on 05/14/13   . Pneumonia    2012  . PONV (postoperative nausea and vomiting)   . Thyroid disease    hyperparathyroidism    Past Surgical History:  Procedure Laterality Date  . ABDOMINAL AORTAGRAM Bilateral 11/06/2012   Procedure: ABDOMINAL AORTAGRAM;  Surgeon: Elam Dutch, MD;  Location: [facility] CATH LAB;  Service: Cardiovascular;  Laterality: Bilateral;  . ABDOMINAL AORTOGRAM N/A 07/11/2017   Procedure: ABDOMINAL AORTOGRAM;  Surgeon: Elam Dutch, MD;  Location: [place] CV LAB;  Service: Cardiovascular;  Laterality: N/A;  . AMPUTATION Right 11/10/2012   Procedure: AMPUTATION FIRST and SECOND TOES Right Foot;  Surgeon: Elam Dutch, MD;  Location: [place];  Service: Vascular;  Laterality: Right;  . AMPUTATION Left 04/07/2013   Procedure: AMPUTATION DIGIT- LEFT 1ST TOE;  Surgeon: Mal Misty, MD;  Location: [place];  Service: Vascular;  Laterality: Left;  . AMPUTATION Left 04/26/2013   Procedure: Left Foot Transmetatarsal Amputation;  Surgeon: Newt Minion, MD;  Location: [place];  Service: Orthopedics;  Laterality: Left;  . AMPUTATION Left 05/14/2013   Procedure: AMPUTATION BELOW KNEE;  Surgeon: Newt Minion, MD;  Location: [place];  Service: Orthopedics;  Laterality: Left;  Left Below Knee Amputation  . AMPUTATION Left  07/23/2013   Procedure: AMPUTATION BELOW KNEE;  Surgeon: Newt Minion, MD;  Location: [place];  Service: Orthopedics;  Laterality: Left;  Left Below Knee Amputation Revision  . AMPUTATION Right 02/18/2014   Procedure: AMPUTATION BELOW KNEE;  Surgeon: Newt Minion, MD;  Location: [place];  Service: Orthopedics;  Laterality: Right;  . AMPUTATION Right 08/20/2016   Procedure: RIGHT LONG FINGER AMPUTATION;  Surgeon: Leanora Cover, MD;  Location: [place];  Service: Orthopedics;  Laterality: Right;  . AMPUTATION Right 01/11/2017   Procedure: RIGHT LONG FINGER AMPUTATION;  Surgeon: Leanora Cover, MD;  Location: [place];  Service: Orthopedics;  Laterality: Right;  . AMPUTATION Left 04/04/2017   Procedure: AMPUTATION DIGIT LEFT LONG FINGER;  Surgeon: Leanora Cover, MD;  Location: [place];  Service: Orthopedics;  Laterality: Left;  . AMPUTATION Right 06/13/2017   Procedure: INDEX FINGER RIGHT AMPUTATION;  Surgeon: Leanora Cover, MD;  Location: [place];  Service: Orthopedics;  Laterality: Right;  . AMPUTATION Left 07/29/2017   Procedure: LEFT RING FINGER AMPUTATION;  Surgeon: Leanora Cover, MD;  Location: [place];  Service: Orthopedics;  Laterality: Left;  . AORTIC ARCH ANGIOGRAPHY N/A 07/11/2017   Procedure: AORTIC ARCH ANGIOGRAPHY;  Surgeon: Elam Dutch, MD;  Location: [place] CV LAB;  Service: Cardiovascular;  Laterality: N/A;  . AV FISTULA PLACEMENT Left 03/25/2012   Procedure: ARTERIOVENOUS (AV) FISTULA CREATION;  Surgeon: Mal Misty, MD;  Location: [place];  Service: Vascular;  Laterality: Left;  . BELOW KNEE LEG AMPUTATION Left 05/14/2013   DR [name]  . BELOW KNEE LEG AMPUTATION Right 02/18/2014   dr duda  . CARDIAC CATHETERIZATION     approx 30 years ago  . CERVICAL DISC SURGERY    . EYE SURGERY Bilateral    CATARACTS  . I&D EXTREMITY Left 04/22/2013   Procedure: IRRIGATION AND DEBRIDEMENT LEFT FIRST TOE AMPUTATION WOUND ;  Surgeon: Mal Misty, MD;  Location: [place];  Service: Vascular;  Laterality: Left;  . I&D EXTREMITY Right 01/05/2014   Procedure: IRRIGATION AND DEBRIDEMENT Right Heel Ulcer;  Surgeon: Mcarthur Rossetti, MD;  Location: [place];  Service: Orthopedics;  Laterality: Right;  Surgeon Available after 5PM  . I&D EXTREMITY Left 07/29/2017   Procedure: DEBRIDEMENT WOUND;  Surgeon: Leanora Cover, MD;  Location: [place];  Service: Orthopedics;  Laterality: Left;  . INSERTION OF DIALYSIS CATHETER Right 03/19/2012   Procedure: INSERTION OF DIALYSIS CATHETER;  Surgeon: Mal Misty, MD;  Location: [place];  Service: Vascular;   Laterality: Right;  Right Internal Jugular  . LIGATION OF COMPETING BRANCHES OF ARTERIOVENOUS FISTULA Left 05/08/2012   Procedure: LIGATION OF COMPETING BRANCHES OF ARTERIOVENOUS FISTULA;  Surgeon: Mal Misty, MD;  Location: [place];  Service: Vascular;  Laterality: Left;  Ultrasound guided  . REPAIR QUADRICEPS/HAMSTRING MUSCLES Left 05/20/2014   Procedure: Left Hamstring Release;  Surgeon: Newt Minion, MD;  Location: [place];  Service: Orthopedics;  Laterality: Left;  . REVISON OF ARTERIOVENOUS FISTULA Left 01/30/2016   Procedure: CEPHALIC VEIN TURNDOWN TO LEFT UPPER ARM;  Surgeon: Elam Dutch, MD;  Location: [place];  Service: Vascular;  Laterality: Left;  . STUMP REVISION Right 05/20/2014   Procedure: Revision Right Below Knee Amputation;  Surgeon: Newt Minion, MD;  Location: [place];  Service: Orthopedics;  Laterality: Right;  . TEE WITHOUT CARDIOVERSION N/A 04/20/2013   Procedure: TRANSESOPHAGEAL ECHOCARDIOGRAM (TEE);  Surgeon: Josue Hector, MD;  Location: [place];  Service: Cardiovascular;  Laterality: N/A;  . THYROIDECTOMY    . TOE AMPUTATION     D/C 04-30-13  . TRANSMETATARSAL AMPUTATION Left 12/16/2012   Procedure: TRANSMETATARSAL AMPUTATION AND VAC PLACEMENT;  Surgeon: Elam Dutch, MD;  Location: [place];  Service: Vascular;  Laterality: Left;  . UPPER EXTREMITY ANGIOGRAPHY Right 07/11/2017   Procedure: UPPER EXTREMITY ANGIOGRAPHY;  Surgeon: Elam Dutch, MD;  Location: [place] CV LAB;  Service: Cardiovascular;  Laterality: Right;    Prior to Admission medications   Medication Sig Start Date End Date Taking? Authorizing Provider  aspirin EC 81 MG tablet Take 81 mg by mouth daily.   Yes [provider]  atorvastatin (LIPITOR) 10 MG tablet TAKE 1 TABLET (10 MG TOTAL) BY MOUTH DAILY. 03/17/17  Yes Martinique, Betty G, MD  lanthanum (FOSRENOL) 1000 MG chewable tablet Chew 1,000 mg by mouth daily as needed.  12/16/16  Yes [provider]  levothyroxine  (SYNTHROID, LEVOTHROID) 150 MCG tablet TAKE 1/2 TABLET BY MOUTH ON MONDAYS AND 1 TABLET BY MOUTH THE REST OF THE WEEK. 06/17/17  Yes Martinique, Betty G, MD  multivitamin (RENA-VIT) TABS tablet Take 1 tablet by mouth 4 (four) times a week.    Yes [provider]  naproxen sodium (ALEVE) 220 MG tablet Take 220 mg by mouth daily as needed (pain).   Yes [provider]  ondansetron (ZOFRAN) 4 MG tablet Take 1 tablet (4 mg total) by mouth every 8 (eight) hours as needed for nausea or vomiting. 07/29/17  Yes Leanora Cover, MD  oxyCODONE (ROXICODONE) 5 MG immediate release tablet 1-2 p.o. every 6 hours as needed pain 07/29/17  Yes Leanora Cover, MD  oxyCODONE-acetaminophen (PERCOCET) 5-325 MG tablet 1-2 tabs po q6 hours prn pain Patient taking differently: Take 1-2 tablets by mouth every 6 (six) hours as needed for moderate pain or severe pain. 1-2 tabs po q6 hours prn pain 04/04/17  Yes Leanora Cover, MD  Continuous Blood Gluc Sensor (FREESTYLE LIBRE 14 DAY SENSOR) MISC USE DAILY*NOT COVERED* 06/27/17   Martin[place], Betty G, MD  Continuous Blood Gluc Sensor (FREESTYLE LIBRE 14 DAY SENSOR) MISC USE DAILY*NOT COVERED* 07/15/17   Martin[place], Betty G, MD    Current Facility-Administered Medications  Medication Dose Route Frequency Provider Last Rate Last Dose  . Chlorhexidine Gluconate Cloth 2 % PADS 6 each  6 each Topical Daily Rai, Ripudeep K, MD   6 each at 08/02/17 1401  . dextrose 5 % solution   Intravenous Continuous Rai, Ripudeep K, MD 40 mL/hr at 08/03/17 1200    . famotidine (PEPCID) IVPB 20 mg premix  20 mg Intravenous Q12H Saverio Danker, PA-C   Stopped at 08/03/17 1031  . levothyroxine (SYNTHROID, LEVOTHROID) injection 75 mcg  75 mcg Intravenous Daily Caren Griffins, MD   75 mcg at 08/03/17 0954  . ondansetron (ZOFRAN) injection 4 mg  4 mg Intravenous Q6H PRN Caren Griffins, MD   4 mg at 08/01/17 2147  . oxyCODONE-acetaminophen (PERCOCET/ROXICET) 5-325 MG per tablet 1-2 tablet  1-2 tablet Oral  Q6H PRN Rai, Vernelle Emerald, MD   2 tablet at 08/03/17 0952  . piperacillin-tazobactam (ZOSYN) IVPB 3.375 g  3.375 g Intravenous Q12H Rumbarger, Valeda Malm, RPH   Stopped at 08/03/17 1610    Allergies as of 08/01/2017 - Review Complete 08/01/2017  Allergen Reaction Noted  . Bee venom Anaphylaxis 05/01/2013  . Ivp dye [iodinated diagnostic agents] Hives 01/28/2011  . Lisinopril Cough 10/05/2012  . Morphine and related  Hives and Other (See Comments) 03/18/2012    Family History  Problem Relation Age of Onset  . Diabetes Mother   . Cancer Mother        bone   . Kidney disease Father     Social History   Socioeconomic History  . Marital status: Divorced    Spouse name: Not on file  . Number of children: Not on file  . Years of education: Not on file  . Highest education level: Not on file  Occupational History  . Not on file  Social Needs  . Financial resource strain: Not on file  . Food insecurity:    Worry: Not on file    Inability: Not on file  . Transportation needs:    Medical: Not on file    Non-medical: Not on file  Tobacco Use  . Smoking status: Former Smoker    Types: Cigarettes    Last attempt to quit: 03/19/1982    Years since quitting: 35.4  . Smokeless tobacco: Never Used  Substance and Sexual Activity  . Alcohol use: No    Alcohol/week: 0.0 oz    Comment: rare  . Drug use: No  . Sexual activity: Never  Lifestyle  . Physical activity:    Days per week: Not on file    Minutes per session: Not on file  . Stress: Not on file  Relationships  . Social connections:    Talks on phone: Not on file    Gets together: Not on file    Attends religious service: Not on file    Active member of club or organization: Not on file    Attends meetings of clubs or organizations: Not on file    Relationship status: Not on file  . Intimate partner violence:    Fear of current or ex partner: Not on file    Emotionally abused: Not on file    Physically abused: Not on file     Forced sexual activity: Not on file  Other Topics Concern  . Not on file  Social History Narrative  . Not on file    Review of Systems: As per HPI, all others negative  Physical Exam: Vital signs in last 24 hours: Temp:  [98.1 F (36.7 C)-98.9 F (37.2 C)] 98.9 F (37.2 C) (07/21 1200) Pulse Rate:  [71-85] 71 (07/21 1300) Resp:  [8-17] 15 (07/21 1300) BP: (112-148)/(27-105) 141/105 (07/21 1300) SpO2:  [96 %-100 %] 100 % (07/21 1300) Last BM Date: (PTA) General:   Alert, somewhat thin-appearing, pleasant and cooperative in NAD Head:  Normocephalic and atraumatic. Eyes:  Sclera clear, no icterus.   Conjunctiva pink. Ears:  Normal auditory acuity. Nose:  No deformity, discharge,  or lesions. Mouth:  No deformity or lesions.  Oropharynx pink & moist. Neck:  Supple; no masses or thyromegaly. Lungs:  Clear throughout to auscultation.   No wheezes, crackles, or rhonchi. No acute distress. Heart:  Regular rate and rhythm; no murmurs, clicks, rubs,  or gallops. Abdomen:  Soft, nontender and nondistended. No masses, hepatosplenomegaly or hernias noted. Normal bowel sounds, without guarding, and without rebound.     Msk:  Symmetrical without gross deformities. Normal posture. Pulses:  Normal pulses noted. Extremities:  Missing couple fingers right hand; wrapping on left hand with couple fingers also missing; Without clubbing or edema. Neurologic:  Alert and  oriented x4; diffusely weak, otherwise grossly normal neurologically. Skin:  Intact without significant lesions or rashes. Psych:  Alert and cooperative. Normal  mood and affect.   Lab Results: Recent Labs    08/01/17 0935 08/02/17 0256 08/03/17 0435  WBC 9.1 13.6* 11.7*  HGB 12.6* 10.5* 10.2*  HCT 43.4 36.0* 34.5*  PLT 352 297 267   BMET Recent Labs    08/01/17 0935 08/02/17 0256 08/03/17 0435  NA 144 143 142  K 4.1 4.6 4.8  CL 99 102 100  CO2 _0 GLUCOSE 140* 107* 99  BUN 27* 39* 49*  CREATININE 6.64*  7.68* 8.80*  CALCIUM 8.7* 8.5* 8.4*   LFT Recent Labs    08/01/17 1125 08/02/17 0256  PROT 7.8 6.6  ALBUMIN 2.9* 2.5*  AST 21 22  ALT 6 <5  ALKPHOS 131* 108  BILITOT 0.9 1.1  BILIDIR 0.2  --   IBILI 0.7  --    PT/INR No results for input(s): LABPROT, INR in the last 72 hours.  Studies/Results: No results found.  Impression:  1.  Abnormal imaging:  Suspected ascending colon mass, peritoneal/omental lesions, liver lesion, pulmonary lesions. Overall most suspicious for widely metastatic colon cancer. 2.  Nausea and vomiting, with blood(?).  Gastric pneumatosis seen.  Patient states his vomiting after anesthesia for work on his left hand; ischemic injury after anesthesia? 3.  Weight loss. 4.  ESRD and other chronic medical conditions.  Plan:  1.  There is significant concern for widely metastatic cancer, and if so patient's overall prognosis is guarded.  However, patient is fully alert, conversant, and functional; he desires any type of treatment for which he might be eligible.  In light of this, as well as after review of patient's own goals of care, I feel that we should pursue further work-up to try to obtain a tissue diagnosis. 2.  Given recent nausea, vomiting and gastric pneumatosis, doubt patient will do well with peroral bowel preparation for colonoscopy (which would be required, as the suspected region of abnormality is in the very proximal colon).  As a result, in order to get tissue diagnosis, would advise IR-targeted biopsies of one of the patient's many suspected metastatic lesions. 3.  On antibiotics for gastric pneumatosis and concern for ischemia. 4.  If IR-guided biopsies are unsuccessful, we could reconsider colonoscopy. 5.  Case discussed with Dr. Tana Coast. 6.  [facility] will sign-off; please call with questions; thank you for the consultation.   LOS: 2 days   Milea Klink,[place] M  08/03/2017, 1:55 PM  Cell [phone] If no answer or after 5 PM call [phone]

## 2017-08-03 NOTE — Progress Notes (Signed)
[facility] Progress Note   Subjective: Met with palliative care this AM. Still full code but going to speak to children later today. Brother recently died of colon cancer. Is aware of limited options, trying to make best decision for what is right for him.   Objective Vitals:   08/03/17 0758 08/03/17 0800 08/03/17 0900 08/03/17 1000  BP:  (!) 140/42 (!) 134/48 (!) 145/47  Pulse:  84 74 85  Resp:  '15 12 14  ' Temp: 98.1 F (36.7 C)     TempSrc: Oral     SpO2:  100% 100% 100%  Weight:       Physical Exam General: Pleasant elderly male in NAD Heart: S1,S2 RRR Lungs: CTAB A/P Abdomen: Active BS, NT Extremities: Bilateral BKA no stump edema. L hand with recent middle finger amputation. Finger amputations R hand.  Dialysis Access: [facility] drsg CDI.   Additional Objective Labs: Basic Metabolic Panel: Recent Labs  Lab 08/01/17 0935 08/02/17 0256 08/03/17 0435  NA 144 143 142  K 4.1 4.6 4.8  CL 99 102 100  CO2 '28 26 23  ' GLUCOSE 140* 107* 99  BUN 27* 39* 49*  CREATININE 6.64* 7.68* 8.80*  CALCIUM 8.7* 8.5* 8.4*   Liver Function Tests: Recent Labs  Lab 08/01/17 1125 08/02/17 0256  AST 21 22  ALT 6 <5  ALKPHOS 131* 108  BILITOT 0.9 1.1  PROT 7.8 6.6  ALBUMIN 2.9* 2.5*   Recent Labs  Lab 08/01/17 1125  LIPASE 23   CBC: Recent Labs  Lab 08/01/17 0935 08/02/17 0256 08/03/17 0435  WBC 9.1 13.6* 11.7*  HGB 12.6* 10.5* 10.2*  HCT 43.4 36.0* 34.5*  MCV 85.3 84.3 84.1  PLT 352 297 267   Blood Culture    Component Value Date/Time   SDES BLOOD RIGHT WRIST 04/07/2014 0340   SPECREQUEST BOTTLES DRAWN AEROBIC ONLY 3 CC 04/07/2014 0340   CULT  04/07/2014 0340    NO GROWTH 5 DAYS Performed at [facility] 04/13/2014 FINAL 04/07/2014 0340    Cardiac Enzymes: No results for input(s): CKTOTAL, CKMB, CKMBINDEX, TROPONINI in the last 168 hours. CBG: Recent Labs  Lab 07/29/17 1348  GLUCAP 111*   Iron Studies: No results  for input(s): IRON, TIBC, TRANSFERRIN, FERRITIN in the last 72 hours. '@lablastinr3' @ Studies/Results: Ct Abdomen Pelvis Wo Contrast  Addendum Date: 08/01/2017   ADDENDUM REPORT: 08/01/2017 12:02 ADDENDUM: These results were called by telephone at the time of interpretation on 08/01/2017 prior to signing off the report at approximately 11:50 a.m. to Dr. Lajean Saver , who verbally acknowledged these results. Electronically Signed   By: Inge Rise M.D.   On: 08/01/2017 12:02   Result Date: 08/01/2017 CLINICAL DATA:  Abdominal pain with nausea and vomiting beginning today. EXAM: CT ABDOMEN AND PELVIS WITHOUT CONTRAST TECHNIQUE: Multidetector CT imaging of the abdomen and pelvis was performed following the standard protocol without IV contrast. COMPARISON:  CT abdomen and pelvis 02/24/2013. FINDINGS: Lower chest: Innumerable pulmonary nodules are seen in the lung bases and are new since the prior CT scan index nodules include a 0.7 cm nodule in the left lower lobe on image 2 and a second 0.7 cm nodule in the left lower lobe on image 7. Small bilateral pleural effusions are present, larger on the left. No pericardial effusion. Extensive calcific aortic and coronary atherosclerosis is noted. Hepatobiliary: Portal venous gas is identified. There is a low attenuating lesion inferior right hepatic lobe measuring 1.8 cm  on image 29. Visualization of the liver is limited due to streak artifact from a metallic device along the right flank. Gallbladder and biliary tree are unremarkable. Pancreas: Severely atrophic as seen on the prior CT. No focal lesion or biliary ductal dilatation. Spleen: Normal in size.  No focal lesion. Adrenals/Urinary Tract: Marked renal atrophy is seen bilaterally. No focal renal lesion. Urinary bladder stone is unchanged. The adrenal glands appear normal. Stomach/Bowel: Pneumatosis is seen in the stomach wall. The stomach is moderately dilated. Focal wall thickening is seen in the mid  descending colon. Focal wall seen on image 54 series 3 thickening of the ascending colon as is highly suspicious for carcinoma. There is scattered diverticulosis. Vascular/Lymphatic: Extensive atherosclerotic vascular disease is identified. No aneurysm. No lymphadenopathy. Reproductive: Prostate is enlarged. Other: There is a small volume of abdominal ascites. Nodularity of the omentum is identified and most conspicuous in the left abdomen. An omental lymph node measuring 1.7 cm is seen on image 59. No free intraperitoneal air is seen. Musculoskeletal: No fracture or focal lesion. IMPRESSION: Gastric pneumatosis and portal venous gas can be seen in infection of the stomach wall by gas-forming organisms and presumably gastric ischemia. Findings most consistent with carcinoma of the ascending colon with innumerable pulmonary metastases, omental metastases and a single liver metastasis seen on this uninfused exam. Small bilateral pleural effusions. Extensive atherosclerosis. Electronically Signed: By: Inge Rise M.D. On: 08/01/2017 11:50   Medications: . dextrose 40 mL/hr at 08/03/17 0800  . famotidine (PEPCID) IV 20 mg (08/03/17 1001)  . piperacillin-tazobactam (ZOSYN)  IV Stopped (08/03/17 4235)   . Chlorhexidine Gluconate Cloth  6 each Topical Daily  . levothyroxine  75 mcg Intravenous Daily     Dialysis Orders: MWF at [facility] 4hr, 400/800, EDW 66.5kgh, 2K/2Ca, UF#4/linear Na, TDC, Hep 2000 + 1000 mid-run bolus - Hectoral 66mg IV q HD - Mircera 354m IV q 2 weeks  Assessment/Plan: 1. Hematemesis: CT abdomen with gastric pneumatosis, likely ischemic in nature. He has known diffuse severe vascular disease. Surgery consulted. Not deemed surgical candidate D/T metastatic colon cancer. Supportive treatment.  2. Presumed metastatic colon cancer:Based on CT findings (7/19), suspect primary colon cancer with mets to lungs &liver. Palliative care consult per primary.  3. ESRD:Usual MWF  schedule. HD tomorrow on schedule. K+ 4.8. No heparin.  4. Hypertension/volume:BP ok, not hypotensive at this time. No edema. Wt today 67.1 kg. Minimal UF with HD tomorrow.  5. Anemia:Hgb remains HGB drifting down. 10.2 today. Will follow, transfuse prn. 6. Metabolic bone disease:Ca ok. Renal profile with HD tomorrow. Resume Fosrenol 1000 mg PO TID AC when eating food. Cont VDRA.  7. T2DM: Per primary. 8. Hypothyroidism: Per primary. 9. Dispo: Unfortunately, he now has two new medical issues with very poor prognoses. Both problems have been discussed with patient, he and his family appear to be processing the news. Will need to continue conversations as to whether he would want to be aggressive in treatment or not, whether this will change code status, dialysis, etc.    Rita H. Brown NP-C 08/03/2017, 10:50 AM  [phone]
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
## 2017-08-03 NOTE — Consult Note (Signed)
Consultation Note Date: 08/03/2017   Patient Name: Jack [name]  DOB: 17-Feb-1945  MRN: [id number]  Age / Sex: 72 y.o., male  PCP: Martinique, Betty G, MD Referring Physician: Mendel Corning, MD  Reason for Consultation: Establishing goals of care and Psychosocial/spiritual support  HPI/Patient Profile: 72 y.o. male   admitted on 08/01/2017 with past  medical history significant of end-stage renal disease, diabetes mellitus, bilateral amputee, significant peripheral vascular disease recently hospitalized and  underwent surgery for left ring finger amputation due to gangrene, is being brought to the hospital from his dialysis unit due to hypotension, nausea and vomiting.   In the emergency room he is afebrile, satting 100% on room air, few blood pressure readings were in the 60s however on recheck was 170.  He underwent a CT scan of the abdomen and pelvis which showed gastric pneumatosis and portal venous gas concerning for gastric ischemia.  CT scan also showed likelihood of ascending colon cancer with pulmonary, omental and liver mets.    Patient lives alone with the collateral support from his family.  Patient faces treatment option decisions, advanced directive decisions and anticipatory care needs.  Clinical Assessment and Goals of Care:  This NP Wadie Lessen reviewed medical records, received report from team, assessed the patient and then meet at the patient's bedside  to discuss diagnosis, prognosis, GOC, and options.  Concept of Palliative Care was discussed as part of a holistic  treatment plan.    We discussed his multiple co-morbid ites and how each impacts the other significantly affecting his health and wellness.  We discussed the impact of overall failure to thrive on his ability to be eligible for or be able to tolerate aggressive medical interventions to treat serious life limiting  illnesses specific to cancer; ie chemotherapy.  He verbalized  an understanding of the seriousness of his situation and specifically asks me "people who are in the same boat as me, how long do they live?"   We discussed how desire for life prolonging interventions can affect prognosis and quality of life.   The difference between a aggressive medical intervention path  and a palliative comfort care path for this patient at this time was had.  Values and goals of care important to patient and family were attempted to be elicited.  A  discussion was had today regarding advanced directives.  Concepts specific to code status, artifical feeding and hydration, continued IV antibiotics and rehospitalization was had.    Questions and concerns addressed.   Family encouraged to call with questions or concerns.    PMT will continue to support holistically.   Later in the day introduced myself to two daughters [name] and Corie) at the bedside and encouraged them to call with questions or concerns    No documented HPOA or advanced directive.  Encouraged to complete with support of spiritual care   SUMMARY OF RECOMMENDATIONS    At this time patient is open to all offered and available medical interventions to prolong life.  He  wishes to proceed with further diagnostics and specialty consults in order to gather pertinent information to make informed decisions.     Code Status/Advance Care Planning:  DNR- encouraged to consider DNR/DNI knowing poor outcomes in similar patients    Symptom Management:   Generalized pain: Continue current  Medications.  Demonstrated with patient relaxation breathing techniques   Palliative Prophylaxis:   Bowel Regimen, Delirium Protocol, Frequent Pain Assessment and Oral Care  Additional Recommendations (Limitations, Scope, Preferences):  Full Scope Treatment  Psycho-social/Spiritual:   Desire for further Chaplaincy support:yes   Prognosis:   Unable to  determine  Discharge Planning: To Be Determined      Primary Diagnoses: Present on Admission: . GI bleed . Anemia in chronic kidney disease . Anemia, iron deficiency . Hypertension . Colon cancer ([place])   I have reviewed the medical record, interviewed the patient and family, and examined the patient. The following aspects are pertinent.  Past Medical History:  Diagnosis Date  . Anemia   . Arthritis   . Asthma   . Contracture of joint    left knee  . Decubitus ulcer of sacral region, stage 2 03/07/2014  . Diabetes mellitus with peripheral vascular disease ([place])   . End-stage renal disease on hemodialysis [facility])    Started HD March 2014.  Cause of ESRD was DM.  Gets HD at [facility] on [address] on MWF schedule.   Marland Kitchen ESRD on hemodialysis ([place])   . Gangrene ([place])    right BKA  . Gangrene ([place])    right index finger  . Gangrene of foot ([place])   . Glaucoma   . Glaucoma 03/07/2014  . Heart murmur   . History of MRSA infection 04/22/2013   Bacteremia assoc w L foot wound infection Mar 2015   . Hyperparathyroidism, secondary renal ([place])   . Hypertension   . Hypothyroidism   . MRSA bacteremia   . Multiple myeloma, without mention of having achieved remission 03/30/2012   Cytogenetic neg on 03/23/2012. Pt states he DOES NOT have cancer  . Necrosis ([place])    left long finger  . Peripheral arterial disease ([place])   . Peripheral vascular disease, unspecified ([place]) 11/19/2012   In the past had R foot toe amps then R TMA. In 2015 had left foot toe amp > then TMA >then L BKA on 05/14/13   . Pneumonia    2012  . PONV (postoperative nausea and vomiting)   . Thyroid disease    hyperparathyroidism   Social History   Socioeconomic History  . Marital status: Divorced    Spouse name: Not on file  . Number of children: Not on file  . Years of education: Not on file  . Highest education level: Not on file  Occupational History  . Not on file  Social Needs  . Financial resource strain: Not  on file  . Food insecurity:    Worry: Not on file    Inability: Not on file  . Transportation needs:    Medical: Not on file    Non-medical: Not on file  Tobacco Use  . Smoking status: Former Smoker    Types: Cigarettes    Last attempt to quit: 03/19/1982    Years since quitting: 35.4  . Smokeless tobacco: Never Used  Substance and Sexual Activity  . Alcohol use: No    Alcohol/week: 0.0 oz    Comment: rare  . Drug use: No  . Sexual activity: Never  Lifestyle  . Physical  activity:    Days per week: Not on file    Minutes per session: Not on file  . Stress: Not on file  Relationships  . Social connections:    Talks on phone: Not on file    Gets together: Not on file    Attends religious service: Not on file    Active member of club or organization: Not on file    Attends meetings of clubs or organizations: Not on file    Relationship status: Not on file  Other Topics Concern  . Not on file  Social History Narrative  . Not on file   Family History  Problem Relation Age of Onset  . Diabetes Mother   . Cancer Mother        bone   . Kidney disease Father    Scheduled Meds: . Chlorhexidine Gluconate Cloth  6 each Topical Daily  . levothyroxine  75 mcg Intravenous Daily   Continuous Infusions: . dextrose 40 mL/hr at 08/03/17 0800  . famotidine (PEPCID) IV Stopped (08/02/17 2239)  . piperacillin-tazobactam (ZOSYN)  IV Stopped (08/03/17 0712)   PRN Meds:.ondansetron (ZOFRAN) IV Medications Prior to Admission:  Prior to Admission medications   Medication Sig Start Date End Date Taking? Authorizing Provider  aspirin EC 81 MG tablet Take 81 mg by mouth daily.   Yes [provider]  atorvastatin (LIPITOR) 10 MG tablet TAKE 1 TABLET (10 MG TOTAL) BY MOUTH DAILY. 03/17/17  Yes Martinique, Betty G, MD  lanthanum (FOSRENOL) 1000 MG chewable tablet Chew 1,000 mg by mouth daily as needed.  12/16/16  Yes [provider]  levothyroxine (SYNTHROID, LEVOTHROID) 150 MCG  tablet TAKE 1/2 TABLET BY MOUTH ON MONDAYS AND 1 TABLET BY MOUTH THE REST OF THE WEEK. 06/17/17  Yes Martinique, Betty G, MD  multivitamin (RENA-VIT) TABS tablet Take 1 tablet by mouth 4 (four) times a week.    Yes [provider]  naproxen sodium (ALEVE) 220 MG tablet Take 220 mg by mouth daily as needed (pain).   Yes [provider]  ondansetron (ZOFRAN) 4 MG tablet Take 1 tablet (4 mg total) by mouth every 8 (eight) hours as needed for nausea or vomiting. 07/29/17  Yes Leanora Cover, MD  oxyCODONE (ROXICODONE) 5 MG immediate release tablet 1-2 p.o. every 6 hours as needed pain 07/29/17  Yes Leanora Cover, MD  oxyCODONE-acetaminophen (PERCOCET) 5-325 MG tablet 1-2 tabs po q6 hours prn pain Patient taking differently: Take 1-2 tablets by mouth every 6 (six) hours as needed for moderate pain or severe pain. 1-2 tabs po q6 hours prn pain 04/04/17  Yes Leanora Cover, MD  Continuous Blood Gluc Sensor (FREESTYLE LIBRE 14 DAY SENSOR) MISC USE DAILY*NOT COVERED* 06/27/17   Martinique, Betty G, MD  Continuous Blood Gluc Sensor (FREESTYLE LIBRE 14 DAY SENSOR) MISC USE DAILY*NOT COVERED* 07/15/17   Martin[place], Betty G, MD   Allergies  Allergen Reactions  . Bee Venom Anaphylaxis  . Ivp Dye [Iodinated Diagnostic Agents] Hives  . Lisinopril Cough  . Morphine And Related Hives and Other (See Comments)    Bradycardia    Review of Systems  Constitutional:       -ongoing "phantom pain" right stump  Neurological: Positive for weakness.    Physical Exam  Constitutional: He is oriented to person, place, and time. He appears ill.  Cardiovascular: Normal rate, regular rhythm and normal heart sounds.  Pulmonary/Chest: Effort normal and breath sounds normal.  Abdominal: There is tenderness.  Musculoskeletal:  -noted  BLLE amputations and multiple fingers both hands  Neurological: He is alert and oriented to person, place, and time.  Skin: Skin is warm and dry.    Vital Signs: BP (!) 140/42 (BP Location: Right  Arm)   Pulse 84   Temp 98.1 F (36.7 C) (Oral)   Resp 15   Wt 67.1 kg (148 lb)   SpO2 100%   BMI 21.24 kg/m  Pain Scale: 0-10   Pain Score: 0-No pain   SpO2: SpO2: 100 % O2 Device:SpO2: 100 % O2 Flow Rate: .   IO: Intake/output summary:   Intake/Output Summary (Last 24 hours) at 08/03/2017 0914 Last data filed at 08/03/2017 0800 Gross per 24 hour  Intake 913.13 ml  Output 0 ml  Net 913.13 ml    LBM: Last BM Date: (PTA) Baseline Weight: Weight: 67.1 kg (148 lb) Most recent weight: Weight: 67.1 kg (148 lb)     Palliative Assessment/Data: 40 % at best   Discussed with Dr Tana Coast and Dr Carmina Miller  Time In: 1245 Time Out:  1400 Time Total: 75 minutes Greater than 50%  of this time was spent counseling and coordinating care related to the above assessment and plan.  Signed by: Wadie Lessen, NP   Please contact Palliative Medicine Team phone at [phone] for questions and concerns.  For individual provider: See Shea Evans

## 2017-08-04 ENCOUNTER — Inpatient Hospital Stay (HOSPITAL_COMMUNITY): Payer: Medicare Other

## 2017-08-04 ENCOUNTER — Other Ambulatory Visit: Payer: Self-pay

## 2017-08-04 ENCOUNTER — Ambulatory Visit (INDEPENDENT_AMBULATORY_CARE_PROVIDER_SITE_OTHER): Payer: Medicare Other | Admitting: Orthopaedic Surgery

## 2017-08-04 LAB — BASIC METABOLIC PANEL
Anion gap: 19 — ABNORMAL HIGH (ref 5–15)
BUN: 59 mg/dL — AB (ref 8–23)
CALCIUM: 8.2 mg/dL — AB (ref 8.9–10.3)
CO2: 26 mmol/L (ref 22–32)
Chloride: 97 mmol/L — ABNORMAL LOW (ref 98–111)
Creatinine, Ser: 9.79 mg/dL — ABNORMAL HIGH (ref 0.61–1.24)
GFR calc Af Amer: 5 mL/min — ABNORMAL LOW (ref >60)
GFR, EST NON AFRICAN AMERICAN: 5 mL/min — AB (ref >60)
GLUCOSE: 91 mg/dL (ref 70–99)
POTASSIUM: 4.4 mmol/L (ref 3.5–5.1)
Sodium: 142 mmol/L (ref 135–145)

## 2017-08-04 LAB — CBC
HEMATOCRIT: 33.9 % — AB (ref 39.0–52.0)
Hemoglobin: 9.9 g/dL — ABNORMAL LOW (ref 13.0–17.0)
MCH: 24.5 pg — AB (ref 26.0–34.0)
MCHC: 29.2 g/dL — ABNORMAL LOW (ref 30.0–36.0)
MCV: 83.9 fL (ref 78.0–100.0)
PLATELETS: 250 10*3/uL (ref 150–400)
RBC: 4.04 MIL/uL — ABNORMAL LOW (ref 4.22–5.81)
RDW: 16.8 % — AB (ref 11.5–15.5)
WBC: 9.1 10*3/uL (ref 4.0–10.5)

## 2017-08-04 LAB — PROTIME-INR
INR: 1.52
Prothrombin Time: 18.1 seconds — ABNORMAL HIGH (ref 11.4–15.2)

## 2017-08-04 MED ORDER — POLYETHYLENE GLYCOL 3350 17 G PO PACK
17.0000 g | PACK | Freq: Once | ORAL | Status: DC
  Filled 2017-08-04: qty 1

## 2017-08-04 MED ORDER — MIDAZOLAM HCL 2 MG/2ML IJ SOLN
INTRAMUSCULAR | Status: AC | PRN
  Administered 2017-08-04: 0.5 mg via INTRAVENOUS

## 2017-08-04 MED ORDER — FAMOTIDINE IN NACL 20-0.9 MG/50ML-% IV SOLN
20.0000 mg | INTRAVENOUS | Status: DC
  Administered 2017-08-05 – 2017-08-08 (×4): 20 mg via INTRAVENOUS
  Filled 2017-08-04 (×4): qty 50

## 2017-08-04 MED ORDER — LIDOCAINE HCL 1 % IJ SOLN
INTRAMUSCULAR | Status: AC
  Filled 2017-08-04: qty 20

## 2017-08-04 MED ORDER — SODIUM CHLORIDE 0.9 % IV SOLN
INTRAVENOUS | Status: AC | PRN
  Administered 2017-08-04: 10 mL/h via INTRAVENOUS

## 2017-08-04 MED ORDER — SODIUM CHLORIDE 0.9 % IV SOLN
INTRAVENOUS | Status: DC | PRN
  Administered 2017-08-07: 17:00:00 via INTRAVENOUS

## 2017-08-04 MED ORDER — FENTANYL CITRATE (PF) 100 MCG/2ML IJ SOLN
INTRAMUSCULAR | Status: AC | PRN
  Administered 2017-08-04: 25 ug via INTRAVENOUS

## 2017-08-04 MED ORDER — DEXTROSE 50 % IV SOLN
INTRAVENOUS | Status: AC
  Administered 2017-08-04: 25 mL
  Filled 2017-08-04: qty 50

## 2017-08-04 MED ORDER — MIDAZOLAM HCL 2 MG/2ML IJ SOLN
INTRAMUSCULAR | Status: AC
  Filled 2017-08-04: qty 2

## 2017-08-04 MED ORDER — FENTANYL CITRATE (PF) 100 MCG/2ML IJ SOLN
INTRAMUSCULAR | Status: AC
  Filled 2017-08-04: qty 2

## 2017-08-04 NOTE — Progress Notes (Addendum)
Hypoglycemic Event  CBG: 66  Treatment: 69ml of 50% dextrose solution.  Symptoms: patient stated that he felt weak  Follow-up CBG: [id number] CBG Result:124  Possible Reasons for Event: patient is NPO,has 5% dextrose going at 37ml/hr.   Comments/MD notified:Rai,MD.    Renee Rival

## 2017-08-04 NOTE — Progress Notes (Signed)
Patient c/o constipation, verbal order received from [place], MD for one time dose of miralax 17g packet  and full liquid diet will monitor.

## 2017-08-04 NOTE — Procedures (Signed)
CT and US guided core biopsies of peritoneal nodule in left lower abdomen.  4 cores obtained.  Minimal blood loss and no immediate complication.

## 2017-08-04 NOTE — Progress Notes (Signed)
Hemodialysis canceled for today by Dr.Schertz. Rescheduled for 08/05/2017. Kristin Bruins , RN notified  At [phone] of above

## 2017-08-04 NOTE — Progress Notes (Addendum)
[facility] Progress Note   Subjective:   Doing ok.  No new complaints.   Objective Vitals:   08/04/17 1515 08/04/17 1520 08/04/17 1525 08/04/17 1600  BP: (!) 105/48 (!) 112/44 (!) 116/54 (!) 105/35  Pulse: 72 70 71 73  Resp: 12 15 12 13   Temp:    97.7 F (36.5 C)  TempSrc:    Oral  SpO2: 100% 100% 100% 100%  Weight:       Physical Exam General:NAD, pleasant, chronically ill appearing male Heart:RRR, no mrg Lungs:CTAB anterior-lateral  Abdomen:soft, NTND Extremities:b/l BKA no stump edema, finger amputation on b/l hands Dialysis Access: [facility]   Filed Weights   08/01/17 0843  Weight: 67.1 kg (148 lb)    Intake/Output Summary (Last 24 hours) at 08/04/2017 1706 Last data filed at 08/04/2017 1700 Gross per 24 hour  Intake 1506.99 ml  Output -  Net 1506.99 ml    Additional Objective Labs: Basic Metabolic Panel: Recent Labs  Lab 08/02/17 0256 08/03/17 0435 08/04/17 0501  NA 143 142 142  K 4.6 4.8 4.4  CL 102 100 97*  CO2 26 23 26   GLUCOSE 107* 99 91  BUN 39* 49* 59*  CREATININE 7.68* 8.80* 9.79*  CALCIUM 8.5* 8.4* 8.2*   Liver Function Tests: Recent Labs  Lab 08/01/17 1125 08/02/17 0256  AST 21 22  ALT 6 <5  ALKPHOS 131* 108  BILITOT 0.9 1.1  PROT 7.8 6.6  ALBUMIN 2.9* 2.5*   Recent Labs  Lab 08/01/17 1125  LIPASE 23   CBC: Recent Labs  Lab 08/01/17 0935 08/02/17 0256 08/03/17 0435 08/04/17 0501  WBC 9.1 13.6* 11.7* 9.1  HGB 12.6* 10.5* 10.2* 9.9*  HCT 43.4 36.0* 34.5* 33.9*  MCV 85.3 84.3 84.1 83.9  PLT 352 297 267 250   Blood Culture    Component Value Date/Time   SDES BLOOD RIGHT WRIST 04/07/2014 0340   SPECREQUEST BOTTLES DRAWN AEROBIC ONLY 3 CC 04/07/2014 0340   CULT  04/07/2014 0340    NO GROWTH 5 DAYS Performed at [place] 04/13/2014 FINAL 04/07/2014 0340    CBG: Recent Labs  Lab 07/29/17 1348  GLUCAP 111*    Lab Results  Component Value Date   INR 1.52 08/04/2017   INR 1.09 05/20/2014   INR 1.18 04/07/2014   PROTIME 16.8 (H) 05/20/2012   PROTIME 14.4 (H) 05/13/2012   PROTIME 13.2 05/06/2012   Studies/Results: Ct Biopsy  Result Date: 08/04/2017 INDICATION: 72 year old with concern for metastatic disease. Recent imaging demonstrated pulmonary nodules, peritoneal nodules, ascites, liver lesion and colon lesion. Tissue diagnosis is needed. EXAM: CT AND ULTRASOUND GUIDED CORE BIOPSY OF A PERITONEAL NODULE MEDICATIONS: None. ANESTHESIA/SEDATION: Moderate (conscious) sedation was employed during this procedure. A total of Versed 0.5 mg and Fentanyl 25 mcg was administered intravenously. Moderate Sedation Time: 29 minutes. The patient's level of consciousness and vital signs were monitored continuously by radiology nursing throughout the procedure under my direct supervision. FLUOROSCOPY TIME:  None COMPLICATIONS: None immediate. PROCEDURE: Informed written consent was obtained from the patient after a thorough discussion of the procedural risks, benefits and alternatives. All questions were addressed. A timeout was performed prior to the initiation of the procedure. Patient was placed supine on CT scanner. Images through the abdomen were obtained. The anterior peritoneal nodules were localized using the CT images and ultrasound. One of the larger nodules in the left lower abdomen was targeted for biopsy. This area was prepped and draped in  sterile fashion. Skin was anesthetized with 1% lidocaine. 25 gauge needle was directed towards the nodule with ultrasound and CT images confirmed the correct positioning. Due to the small size and mobility of the peritoneal nodule, 17 gauge coaxial needle was directed into the lesion using ultrasound guidance. Needle position was confirmed within the lesion. Total of 4 core biopsies were obtained with an 18 gauge core device. Specimens placed in formalin. Follow up CT images were obtained after the biopsy. FINDINGS: Numerous small anterior  peritoneal or omental nodules. Dominant peritoneal nodule in left lower abdomen measuring up to 2.0 cm was targeted for biopsy. Needle position confirmed within the lesion with ultrasound guidance. Small amount of ascites is present. IMPRESSION: Image guided core biopsy of a left lower abdominal peritoneal nodule. Electronically Signed   By: Markus Daft M.D.   On: 08/04/2017 16:45    Medications: . sodium chloride    . dextrose 40 mL/hr at 08/04/17 1137  . [START ON 08/05/2017] famotidine (PEPCID) IV    . piperacillin-tazobactam (ZOSYN)  IV 3.375 g (08/04/17 1647)   . Chlorhexidine Gluconate Cloth  6 each Topical Daily  . fentaNYL      . levothyroxine  75 mcg Intravenous Daily  . lidocaine      . midazolam        Dialysis Orders: MWF at [facility] 4hr, 400/800, EDW 66.5kgh, 2K/2Ca, UF#4/linear Na, TDC, Hep 2000 + 1000 mid-run bolus - Hectoral 57mcg IV q HD - Mircera 7mcg IV q 2 weeks   Assessment/Plan: 1. Hematemesis - CT ABD +gastric pneumatosis, likely ischemic. Surgery consulted - not surgical canidate d/t metastatic colon cancer. Supportive treatment. On ABX.  2. Presumed metastatic colon cancer - based on CT findings 7/19. Suspect primary colon CA w/mets to lungs, omentum and liver.  Omental nodule BX pending.   2. ESRD - HD MWF.  HD postponed to tomorrow due to biopsy today and patients requiring emergent HD. K4.4 No Heparin. 3. Anemia of CKD- Hgb 9.9.  Continue to follow.  4. Secondary hyperparathyroidism - Ca ok. RFP with HD.  Resume fosrenol once diet advanced. Cont VDRA. 5. HTN/volume - BP variable. No excess volume on exam.  Minimal UF with HD. 6. Nutrition - Renal diet with fluid restriction once advanced. Renavite.  7. DMT2 - per primary 8. Hypothyroidism - per primary 9. Dispo: Unfortunately grim prognosis.  Family discussions have taken place. Palliative care consulted on goals of care and remains full code.    Jen Mow, PA-C [facility] Pager:  [phone] 08/04/2017,5:06 PM  LOS: 3 days   Pt seen, examined and agree w A/P as above.  [name] MD [employer] pager [phone]   08/04/2017, 5:38 PM
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
## 2017-08-04 NOTE — Progress Notes (Signed)
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 Recent Labs    08/01/17 1125 08/02/17 0256  BILITOT 0.9 1.1  AST 21 22  ALT 6 <5  ALKPHOS 131* 108  PROT 7.8 6.6  ALBUMIN 2.9* 2.5*    TUMOR MARKERS: No results for input(s): AFPTM, CEA, CA199, CHROMGRNA in the last 8760 hours.  Assessment and Plan: Colon lesion. Hepatic and omental nodules suspicious for metastatic process. After review of image, omental nodule is amenable to CT guided perc biopsy. Labs ok Risks and benefits discussed with the patient including, but not limited to bleeding, infection, damage to adjacent structures or low yield requiring additional tests.  All of the patient's questions were answered, patient is agreeable to proceed. Consent signed and in chart.    Thank you for this interesting consult.  I greatly enjoyed meeting Jack [name] and look forward to participating in their care.  A copy of this report was sent to the requesting provider on this date.  Electronically Signed: Ascencion Dike, PA-C 08/04/2017, 11:21 AM   I spent a total of 20 minutes in face to face in clinical  consultation, greater than 50% of which was counseling/coordinating care for omental nodule biopsy.

## 2017-08-04 NOTE — Progress Notes (Signed)
Completed AD materials.  Placed one with unit secretary and will give the others to family/patient later-he had gone for biopsy when came back to give the copies. Jack [name], Chaplain   08/04/17 1400  Clinical Encounter Type  Visited With Patient and family together  Visit Type Follow-up;Other (Comment) Jack [name] AD)  Referral From Physician  Consult/Referral To Chaplain  Spiritual Encounters  Spiritual Needs Prayer  Stress Factors  Patient Stress Factors Health changes  Family Stress Factors Not reviewed  Advance Directives (For Healthcare)  Does Patient Have a Medical Advance Directive? Yes

## 2017-08-05 DIAGNOSIS — D649 Anemia, unspecified: Secondary | ICD-10-CM

## 2017-08-05 DIAGNOSIS — C786 Secondary malignant neoplasm of retroperitoneum and peritoneum: Secondary | ICD-10-CM

## 2017-08-05 DIAGNOSIS — Z87891 Personal history of nicotine dependence: Secondary | ICD-10-CM

## 2017-08-05 DIAGNOSIS — E1122 Type 2 diabetes mellitus with diabetic chronic kidney disease: Secondary | ICD-10-CM

## 2017-08-05 DIAGNOSIS — C7800 Secondary malignant neoplasm of unspecified lung: Secondary | ICD-10-CM

## 2017-08-05 DIAGNOSIS — I739 Peripheral vascular disease, unspecified: Secondary | ICD-10-CM

## 2017-08-05 DIAGNOSIS — R1114 Bilious vomiting: Secondary | ICD-10-CM

## 2017-08-05 DIAGNOSIS — E440 Moderate protein-calorie malnutrition: Secondary | ICD-10-CM

## 2017-08-05 DIAGNOSIS — Z9181 History of falling: Secondary | ICD-10-CM

## 2017-08-05 DIAGNOSIS — Z89612 Acquired absence of left leg above knee: Secondary | ICD-10-CM

## 2017-08-05 DIAGNOSIS — Z89611 Acquired absence of right leg above knee: Secondary | ICD-10-CM

## 2017-08-05 LAB — BASIC METABOLIC PANEL
Anion gap: 17 — ABNORMAL HIGH (ref 5–15)
BUN: 64 mg/dL — AB (ref 8–23)
CHLORIDE: 96 mmol/L — AB (ref 98–111)
CO2: 23 mmol/L (ref 22–32)
CREATININE: 9.93 mg/dL — AB (ref 0.61–1.24)
Calcium: 8 mg/dL — ABNORMAL LOW (ref 8.9–10.3)
GFR calc Af Amer: 5 mL/min — ABNORMAL LOW (ref >60)
GFR calc non Af Amer: 4 mL/min — ABNORMAL LOW (ref >60)
GLUCOSE: 117 mg/dL — AB (ref 70–99)
POTASSIUM: 4.4 mmol/L (ref 3.5–5.1)
SODIUM: 136 mmol/L (ref 135–145)

## 2017-08-05 LAB — CBC
HCT: 33.9 % — ABNORMAL LOW (ref 39.0–52.0)
HEMOGLOBIN: 10.2 g/dL — AB (ref 13.0–17.0)
MCH: 24.9 pg — AB (ref 26.0–34.0)
MCHC: 30.1 g/dL (ref 30.0–36.0)
MCV: 82.9 fL (ref 78.0–100.0)
Platelets: 248 10*3/uL (ref 150–400)
RBC: 4.09 MIL/uL — AB (ref 4.22–5.81)
RDW: 16.8 % — ABNORMAL HIGH (ref 11.5–15.5)
WBC: 10.2 10*3/uL (ref 4.0–10.5)

## 2017-08-05 MED ORDER — CHLORHEXIDINE GLUCONATE CLOTH 2 % EX PADS
6.0000 | MEDICATED_PAD | Freq: Every day | CUTANEOUS | Status: DC
  Administered 2017-08-07 – 2017-08-09 (×3): 6 via TOPICAL
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
## 2017-08-05 NOTE — Progress Notes (Signed)
Dr. Tana Coast paged through [facility] system and made aware of patient nausea and vomiting and zofran given. Will monitor patient. Leeam Cedrone, Bettina Gavia RN

## 2017-08-05 NOTE — Progress Notes (Addendum)
Patient with episode of nausea and vomiting after eating soft foods as ordered for supper. Apple sauce and a few bites of mashed potatoes, one bite of meatloaf per family report, zofran given as ordered as needed for nausea/vomiting. Will monitor patient. Baleigh Rennaker, Bettina Gavia  rN

## 2017-08-05 NOTE — Progress Notes (Signed)
[facility] Progress Note   Subjective:   Doing ok.  No new complaints. 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 Dialysis Access: [facility]   Filed Weights   08/01/17 0843  Weight: 67.1 kg (148 lb)    Intake/Output Summary (Last 24 hours) at 08/05/2017 0903 Last data filed at 08/05/2017 0400 Gross per 24 hour  Intake 1308.97 ml  Output -  Net 1308.97 ml    Additional Objective Labs: Basic Metabolic Panel: Recent Labs  Lab 08/03/17 0435 08/04/17 0501 08/05/17 0500  NA 142 142 136  K 4.8 4.4 4.4  CL 100 97* 96*  CO2 23 26 23   GLUCOSE 99 91 117*  BUN 49* 59* 64*  CREATININE 8.80* 9.79* 9.93*  CALCIUM 8.4* 8.2* 8.0*   Liver Function Tests: Recent Labs  Lab 08/01/17 1125 08/02/17 0256  AST 21 22  ALT 6 <5  ALKPHOS 131* 108  BILITOT 0.9 1.1  PROT 7.8 6.6  ALBUMIN 2.9* 2.5*   Recent Labs  Lab 08/01/17 1125  LIPASE 23   CBC: Recent Labs  Lab 08/01/17 0935 08/02/17 0256 08/03/17 0435 08/04/17 0501 08/05/17 0500  WBC 9.1 13.6* 11.7* 9.1 10.2  HGB 12.6* 10.5* 10.2* 9.9* 10.2*  HCT 43.4 36.0* 34.5* 33.9* 33.9*  MCV 85.3 84.3 84.1 83.9 82.9  PLT 352 297 267 250 248   Blood Culture    Component Value Date/Time   SDES BLOOD RIGHT WRIST 04/07/2014 0340   SPECREQUEST BOTTLES DRAWN AEROBIC ONLY 3 CC 04/07/2014 0340   CULT  04/07/2014 0340    NO GROWTH 5 DAYS Performed at [facility] 04/13/2014 FINAL 04/07/2014 0340     CBG: Recent Labs  Lab 07/29/17 1348  GLUCAP 111*    Lab Results  Component Value Date   INR 1.52 08/04/2017   INR 1.09 05/20/2014   INR 1.18 04/07/2014   PROTIME 16.8 (H) 05/20/2012   PROTIME 14.4 (H) 05/13/2012   PROTIME 13.2 05/06/2012   Studies/Results: Ct Biopsy  Result Date: 08/04/2017 INDICATION: 72 year old with concern for metastatic disease. Recent imaging demonstrated pulmonary nodules, peritoneal nodules, ascites, liver lesion and colon lesion. Tissue diagnosis is needed. EXAM: CT AND ULTRASOUND GUIDED CORE BIOPSY OF A PERITONEAL NODULE MEDICATIONS: None. ANESTHESIA/SEDATION: Moderate (conscious) sedation was employed during this procedure. A total of Versed 0.5 mg and Fentanyl 25 mcg was administered intravenously. Moderate Sedation Time: 29 minutes. The patient's level of consciousness and vital signs were monitored continuously by radiology nursing throughout the procedure under my direct supervision. FLUOROSCOPY TIME:  None COMPLICATIONS: None immediate. PROCEDURE: Informed written consent was obtained from the patient after a thorough discussion of the procedural risks, benefits and alternatives. All questions were addressed. A timeout was performed prior to the initiation of the procedure. Patient was placed supine on CT scanner. Images through the abdomen were obtained. The anterior peritoneal nodules were localized using the CT  images and ultrasound. One of the larger nodules in the left lower abdomen was targeted for biopsy. This area was prepped and draped in sterile fashion. Skin was anesthetized with 1% lidocaine. 25 gauge needle was directed towards the nodule with ultrasound and CT images confirmed the correct positioning. Due to the small size and mobility of the peritoneal nodule, 17 gauge coaxial needle was directed into the lesion using ultrasound guidance. Needle position was confirmed within the lesion. Total of 4 core biopsies were obtained with an 18 gauge  core device. Specimens placed in formalin. Follow up CT images were obtained after the biopsy. FINDINGS: Numerous small anterior peritoneal or omental nodules. Dominant peritoneal nodule in left lower abdomen measuring up to 2.0 cm was targeted for biopsy. Needle position confirmed within the lesion with ultrasound guidance. Small amount of ascites is present. IMPRESSION: Image guided core biopsy of a left lower abdominal peritoneal nodule. Electronically Signed   By: Markus Daft M.D.   On: 08/04/2017 16:45    Medications: . sodium chloride    . dextrose 40 mL/hr at 08/04/17 1137  . famotidine (PEPCID) IV    . piperacillin-tazobactam (ZOSYN)  IV 3.375 g (08/05/17 0221)   . Chlorhexidine Gluconate Cloth  6 each Topical Daily  . levothyroxine  75 mcg Intravenous Daily  . polyethylene glycol  17 g Oral Once    Dialysis Orders: [facility] 4h   66.5kg  2/2  TDC  Hep 2000 then 1000 midrun - Hectoral 27mcg IV q HD - Mircera 56mcg IV q 2 weeks   Assessment/Plan: 1. Hematemesis - CT ABD +gastric pneumatosis. Surgery consulted - not surgical canidate d/t metastatic colon cancer. Supportive treatment. Try to keep BP's from dropping low on HD.  Likely ischemic/ low-flow but not real symptomatic.  On ABX.  2. Presumed metastatic colon cancer - based on CT findings 7/19. Suspect primary colon CA w/mets to lungs, omentum and liver.  Omental nodule BX pending.   3. ESRD - HD MWF.  HD postponed to today. Plan HD tomorrow to get back on sched. 4. Anemia of CKD- Hgb 9.9.  Continue to follow.  5. Secondary hyperparathyroidism - Ca ok. RFP with HD.  Resume fosrenol once diet advanced. Cont VDRA. 6. HTN/volume - BP variable. No excess volume on exam.  7. Dispo: Unfortunately grim prognosis.  Family discussions have taken place. Palliative care consulted on goals of care and remains full code.      [name] MD [employer] pager [phone]   08/05/2017, 9:03 AM
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
## 2017-08-05 NOTE — Progress Notes (Signed)
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  Patient reports pain as minimal.  Objective: Vital signs in last 24 hours: Temp:  [98.3 F (36.8 C)-98.6 F (37 C)] 98.5 F (36.9 C) (07/23 0659) Pulse Rate:  [51-101] 87 (07/23 1620) Resp:  [12-22] 12 (07/23 1305) BP: (90-196)/(34-130) 146/75 (07/23 1620) SpO2:  [96 %-100 %] 98 % (07/23 1620)  Intake/Output from previous day: 07/22 0701 - 07/23 0700 In: 1309 [P.O.:240; I.V.:919; IV Piggyback:150] Out: -  Intake/Output this shift: No intake/output data recorded.  Recent Labs    08/03/17 0435 08/04/17 0501 08/05/17 0500  HGB 10.2* 9.9* 10.2*   Recent Labs    08/04/17 0501 08/05/17 0500  WBC 9.1 10.2  RBC 4.04* 4.09*  HCT 33.9* 33.9*  PLT 250 248   Recent Labs    08/04/17 0501 08/05/17 0500  NA 142 136  K 4.4 4.4  CL 97* 96*  CO2 26 23  BUN 59* 64*  CREATININE 9.79* 9.93*  GLUCOSE 91 117*  CALCIUM 8.2* 8.0*   Recent Labs    08/04/17 0501  INR 1.52    Wound looks good without erythema or drainage.  Dressing replaced.    Assessment/Plan:   One week status post above-noted procedure.  Wound looks good.  Continue with light dressing.  Recheck in 1 to 2 weeks.    Adrijana Haros R 08/05/2017, 8:02 PM

## 2017-08-06 LAB — PHOSPHORUS: PHOSPHORUS: 6.7 mg/dL — AB (ref 2.5–4.6)

## 2017-08-06 NOTE — Progress Notes (Signed)
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 (A) 02/23/2013 2008   BILIRUBINUR SMALL (A) 02/23/2013 2008   KETONESUR NEGATIVE 02/23/2013 2008   PROTEINUR >300 (A) 02/23/2013 2008   UROBILINOGEN 0.2 02/23/2013 2008   NITRITE NEGATIVE 02/23/2013 2008   LEUKOCYTESUR LARGE (A) 02/23/2013 2008     Elmond Poehlman M.D. [facility] 08/06/2017, 12:28 PM  Pager: [phone] Between 7am to 7pm - call Pager - [phone]  After 7pm go to [email or web address] - password [email or web address]  Call night coverage person covering after 7pm

## 2017-08-06 NOTE — Progress Notes (Addendum)
[facility] Progress Note   Subjective:   Patient resting in bed with family at bedside. Does not want to do dialysis today because he had treatment yesterday.  Asked if he or family had any questions about dialysis and they replied "no" except for him asking if his time could be shortened.   Objective Vitals:   08/05/17 1620 08/05/17 2011 08/06/17 0000 08/06/17 0320  BP: (!) 146/75 (!) 111/56 (!) 163/56 100/65  Pulse: 87  77 76  Resp:  16 15 16   Temp:  99.5 F (37.5 C) 98.8 F (37.1 C) 97.8 F (36.6 C)  TempSrc:  Oral Oral Oral  SpO2: 98% 100% 100% 99%  Weight:    69.9 kg (154 lb 1.6 oz)  Height:       Physical Exam General:NAD, chronically ill appearing male Heart:RRR Lungs:CTAB anteriorly, nml WOB Extremities:b/l bka, no stump edema Dialysis Access: R [facility]   Filed Weights   08/05/17 0650 08/05/17 1108 08/06/17 0320  Weight: 69.4 kg (153 lb) 68.5 kg (151 lb 0.2 oz) 69.9 kg (154 lb 1.6 oz)    Intake/Output Summary (Last 24 hours) at 08/06/2017 1102 Last data filed at 08/06/2017 0400 Gross per 24 hour  Intake 1138.89 ml  Output 1051 ml  Net 87.89 ml    Additional Objective Labs: Basic Metabolic Panel: Recent Labs  Lab 08/03/17 0435 08/04/17 0501 08/05/17 0500  NA 142 142 136  K 4.8 4.4 4.4  CL 100 97* 96*  CO2 23 26 23   GLUCOSE 99 91 117*  BUN 49* 59* 64*  CREATININE 8.80* 9.79* 9.93*  CALCIUM 8.4* 8.2* 8.0*   Liver Function Tests: Recent Labs  Lab 08/01/17 1125 08/02/17 0256  AST 21 22  ALT 6 <5  ALKPHOS 131* 108  BILITOT 0.9 1.1  PROT 7.8 6.6  ALBUMIN 2.9* 2.5*   Recent Labs  Lab 08/01/17 1125  LIPASE 23   CBC: Recent Labs  Lab 08/01/17 0935 08/02/17 0256 08/03/17 0435 08/04/17 0501 08/05/17 0500  WBC 9.1 13.6* 11.7* 9.1 10.2  HGB 12.6* 10.5* 10.2* 9.9* 10.2*  HCT 43.4 36.0* 34.5* 33.9* 33.9*  MCV 85.3 84.3 84.1 83.9 82.9  PLT 352 297 267 250 248   Blood Culture    Component Value Date/Time   SDES BLOOD  RIGHT WRIST 04/07/2014 0340   SPECREQUEST BOTTLES DRAWN AEROBIC ONLY 3 CC 04/07/2014 0340   CULT  04/07/2014 0340    NO GROWTH 5 DAYS Performed at [facility] 04/13/2014 FINAL 04/07/2014 0340     Lab Results  Component Value Date   INR 1.52 08/04/2017   INR 1.09 05/20/2014   INR 1.18 04/07/2014   PROTIME 16.8 (H) 05/20/2012   PROTIME 14.4 (H) 05/13/2012   PROTIME 13.2 05/06/2012   Studies/Results: Ct Biopsy  Result Date: 08/04/2017 INDICATION: 72 year old with concern for metastatic disease. Recent imaging demonstrated pulmonary nodules, peritoneal nodules, ascites, liver lesion and colon lesion. Tissue diagnosis is needed. EXAM: CT AND ULTRASOUND GUIDED CORE BIOPSY OF A PERITONEAL NODULE MEDICATIONS: None. ANESTHESIA/SEDATION: Moderate (conscious) sedation was employed during this procedure. A total of Versed 0.5 mg and Fentanyl 25 mcg was administered intravenously. Moderate Sedation Time: 29 minutes. The patient's level of consciousness and vital signs were monitored continuously by radiology nursing throughout the procedure under my direct supervision. FLUOROSCOPY TIME:  None COMPLICATIONS: None immediate. PROCEDURE: Informed written consent was obtained from the patient after a thorough discussion of the procedural risks, benefits and alternatives. All  questions were addressed. A timeout was performed prior to the initiation of the procedure. Patient was placed supine on CT scanner. Images through the abdomen were obtained. The anterior peritoneal nodules were localized using the CT images and ultrasound. One of the larger nodules in the left lower abdomen was targeted for biopsy. This area was prepped and draped in sterile fashion. Skin was anesthetized with 1% lidocaine. 25 gauge needle was directed towards the nodule with ultrasound and CT images confirmed the correct positioning. Due to the small size and mobility of the peritoneal nodule, 17 gauge coaxial needle  was directed into the lesion using ultrasound guidance. Needle position was confirmed within the lesion. Total of 4 core biopsies were obtained with an 18 gauge core device. Specimens placed in formalin. Follow up CT images were obtained after the biopsy. FINDINGS: Numerous small anterior peritoneal or omental nodules. Dominant peritoneal nodule in left lower abdomen measuring up to 2.0 cm was targeted for biopsy. Needle position confirmed within the lesion with ultrasound guidance. Small amount of ascites is present. IMPRESSION: Image guided core biopsy of a left lower abdominal peritoneal nodule. Electronically Signed   By: Markus Daft M.D.   On: 08/04/2017 16:45    Medications: . sodium chloride    . dextrose 40 mL/hr at 08/06/17 0105  . famotidine (PEPCID) IV Stopped (08/05/17 1413)  . piperacillin-tazobactam (ZOSYN)  IV 3.375 g (08/06/17 0243)   . Chlorhexidine Gluconate Cloth  6 each Topical Daily  . Chlorhexidine Gluconate Cloth  6 each Topical Q0600  . levothyroxine  75 mcg Intravenous Daily  . polyethylene glycol  17 g Oral Once    Dialysis Orders: MWF [facility] 4h   66.5kg  2/2  TDC  Hep 2000 then 1000 midrun - Hectoral 61mcg IV q HD - Mircera 2mcg IV q 2 weeks  Assessment/Plan: 1. Hematemesis - CT ABD +gastric pneumatosis. Surgery consulted - not surgical canidate d/t metastatic colon cancer. Supportive treatment. Try to keep BP's from dropping low on HD.  Likely ischemic/ low-flow but not real symptomatic per gen surgery.  On ABX.  2. Metastatic colon cancer, Stage VI - based on CT findings 7/19. Suspect primary colon CA w/mets to lungs, peritoneal and liver. Oncology consulted and recommend supportive care due to multiple comorbidities. No treatment options.      3. ESRD - HD MWF. Patient off schedule this week.  Will write orders for HD tomorrow and resume regular schedule next week on Monday.  4. Anemia of CKD- Hgb 10.2.  Continue to follow. No indication for ESA at this time.  5.  Secondary hyperparathyroidism - Ca ok. Checking Phos.  Resume fosrenol once diet advanced. Cont VDRA. 6. HTN/volume - BP variable. No excess volume on exam. To run even with HD tomorrow.  7. Dispo: Very grim prognosis, Onc predicts survival in days-weeks, likely <4months.  Family discussions have taken place. Patient changed to DNR/DNI but wishes to continue dialysis.   Jen Mow, PA-C [facility] Pager: [phone] 08/06/2017,11:02 AM  LOS: 5 days   Pt seen, examined and agree w A/P as above.  [name] MD [employer] pager [phone]   08/06/2017, 12:45 PM

## 2017-08-06 NOTE — Progress Notes (Signed)
Patient ID: Jack [name], male   DOB: [birth year], 72 y.o.   MRN: [id number]  This NP visited patient at the bedside along with his three daughters and  Dr Alvy Bimler for continued conversation regarding diagnosis, prognosis, goals of care and end-of-life wishes.  Patient and family understand the seriousness of the situation.  Because of his poor baseline performance status and widespread stage IV metastatic colon cancer oncology is not recommending palliative systemic chemotherapy.  They all understand the limited prognosis;  I spoke with them in terms of weeks.    Patient is verbalizing desire to continue with dialysis for as long as he can.  As a family they now face decisions related to disposition.  As a whole they believe that a skilled nursing facility who is able to offer transportation to dialysis is the most viable option.  Will need social work to help with options.  Created space and opportunity for patient and his family to share their thoughts and things regarding the situation.  All verbalized surprise and sadness.  Emotional support offered  Discussed with patient the importance of continued conversation with family and his  medical providers regarding overall plan of care and treatment options,  ensuring decisions are within the context of the patients values and GOCs.  Notified the family that I would not be in the hospital through the week-end but to call the team phone with questions or needs.  Questions and concerns addressed   Discussed with Dr Alvy Bimler and Dr Tana Coast    Total time spent on the unit was 60 minutes  Greater than 50% of the time was spent in counseling and coordination of care  Wadie Lessen NP  Palliative Medicine Team Team Phone # [phone] Pager [phone]

## 2017-08-06 NOTE — Progress Notes (Signed)
Jack [name]   DOB:March 13, 1945   JZ#:[id number]    Assessment & Plan:   Metastatic colon cancer with potential metastatic spread to the liver, lungs, with peritoneal carcinomatosis He has very poor baseline performance status with recurrent admissions to the hospital for other major comorbidities With pneumatosis seen on CT imaging, I think his risks of sepsis with chemotherapy is almost guaranteed I have reviewed findings from imaging studies and pathology report The patient has widespread stage IV metastatic colon cancer I did not offer palliative systemic chemotherapy due to significant risk of side effects including sepsis leading to premature death.  His disease is not curable Family members are wondering about genetic counseling in view that his wife also succumbed to colon cancer and was diagnosed at age 69 I will reach out to my genetic counselor We discussed prognosis.  Without treatment, his survival is likely measured in days to weeks, certainly less than 3 months If the patient decides to discontinue hemodialysis, I think  is prognosis will be less than 2 weeks I appreciate palliative care service to discuss goals of care further with patient and family today  Significant comorbidities with peripheral vascular disease status post multiple amputation, diabetes, end-stage renal disease, anemia and moderate protein calorie malnutrition With significant major comorbidities, his risk of death with chemotherapy is very high I recommend supportive care only  Discharge planning I will defer to primary service. I have not make future appointment for the patient to be seen in the clinic  Jack Lark, MD 08/06/2017  9:38 AM   Subjective:  I have a long meeting with the patient and family.  All his 3 daughters are present.  He denies pain.  He had regular bowel movement.  Objective:  Vitals:   08/06/17 0000 08/06/17 0320  BP: (!) 163/56 100/65  Pulse: 77 76  Resp: 15 16   Temp: 98.8 F (37.1 C) 97.8 F (36.6 C)  SpO2: 100% 99%     Intake/Output Summary (Last 24 hours) at 08/06/2017 9480 Last data filed at 08/06/2017 0400 Gross per 24 hour  Intake 1138.89 ml  Output 1051 ml  Net 87.89 ml    GENERAL:alert, no distress and comfortable NEURO: alert & oriented x 3 with fluent speech Labs:  Lab Results  Component Value Date   WBC 10.2 08/05/2017   HGB 10.2 (L) 08/05/2017   HCT 33.9 (L) 08/05/2017   MCV 82.9 08/05/2017   PLT 248 08/05/2017   NEUTROABS 5.8 06/07/2016    Lab Results  Component Value Date   NA 136 08/05/2017   K 4.4 08/05/2017   CL 96 (L) 08/05/2017   CO2 23 08/05/2017    Studies:  Ct Biopsy  Result Date: 08/04/2017 INDICATION: 72 year old with concern for metastatic disease. Recent imaging demonstrated pulmonary nodules, peritoneal nodules, ascites, liver lesion and colon lesion. Tissue diagnosis is needed. EXAM: CT AND ULTRASOUND GUIDED CORE BIOPSY OF A PERITONEAL NODULE MEDICATIONS: None. ANESTHESIA/SEDATION: Moderate (conscious) sedation was employed during this procedure. A total of Versed 0.5 mg and Fentanyl 25 mcg was administered intravenously. Moderate Sedation Time: 29 minutes. The patient's level of consciousness and vital signs were monitored continuously by radiology nursing throughout the procedure under my direct supervision. FLUOROSCOPY TIME:  None COMPLICATIONS: None immediate. PROCEDURE: Informed written consent was obtained from the patient after a thorough discussion of the procedural risks, benefits and alternatives. All questions were addressed. A timeout was performed prior to the initiation of the procedure. Patient was placed supine on CT  scanner. Images through the abdomen were obtained. The anterior peritoneal nodules were localized using the CT images and ultrasound. One of the larger nodules in the left lower abdomen was targeted for biopsy. This area was prepped and draped in sterile fashion. Skin was  anesthetized with 1% lidocaine. 25 gauge needle was directed towards the nodule with ultrasound and CT images confirmed the correct positioning. Due to the small size and mobility of the peritoneal nodule, 17 gauge coaxial needle was directed into the lesion using ultrasound guidance. Needle position was confirmed within the lesion. Total of 4 core biopsies were obtained with an 18 gauge core device. Specimens placed in formalin. Follow up CT images were obtained after the biopsy. FINDINGS: Numerous small anterior peritoneal or omental nodules. Dominant peritoneal nodule in left lower abdomen measuring up to 2.0 cm was targeted for biopsy. Needle position confirmed within the lesion with ultrasound guidance. Small amount of ascites is present. IMPRESSION: Image guided core biopsy of a left lower abdominal peritoneal nodule. Electronically Signed   By: Jack [name] M.D.   On: 08/04/2017 16:45

## 2017-08-07 LAB — CBC
HCT: 34.5 % — ABNORMAL LOW (ref 39.0–52.0)
Hemoglobin: 10.2 g/dL — ABNORMAL LOW (ref 13.0–17.0)
MCH: 24.5 pg — ABNORMAL LOW (ref 26.0–34.0)
MCHC: 29.6 g/dL — ABNORMAL LOW (ref 30.0–36.0)
MCV: 82.9 fL (ref 78.0–100.0)
Platelets: 252 10*3/uL (ref 150–400)
RBC: 4.16 MIL/uL — ABNORMAL LOW (ref 4.22–5.81)
RDW: 16.6 % — ABNORMAL HIGH (ref 11.5–15.5)
WBC: 8.5 10*3/uL (ref 4.0–10.5)

## 2017-08-07 LAB — RENAL FUNCTION PANEL
Albumin: 1.9 g/dL — ABNORMAL LOW (ref 3.5–5.0)
Anion gap: 16 — ABNORMAL HIGH (ref 5–15)
BUN: 28 mg/dL — ABNORMAL HIGH (ref 8–23)
CO2: 23 mmol/L (ref 22–32)
Calcium: 7.9 mg/dL — ABNORMAL LOW (ref 8.9–10.3)
Chloride: 94 mmol/L — ABNORMAL LOW (ref 98–111)
Creatinine, Ser: 6.46 mg/dL — ABNORMAL HIGH (ref 0.61–1.24)
GFR calc Af Amer: 9 mL/min — ABNORMAL LOW (ref >60)
GFR calc non Af Amer: 8 mL/min — ABNORMAL LOW (ref >60)
Glucose, Bld: 135 mg/dL — ABNORMAL HIGH (ref 70–99)
Phosphorus: 5.8 mg/dL — ABNORMAL HIGH (ref 2.5–4.6)
Potassium: 3.5 mmol/L (ref 3.5–5.1)
Sodium: 133 mmol/L — ABNORMAL LOW (ref 135–145)

## 2017-08-07 MED ORDER — SODIUM CHLORIDE 0.9 % IV SOLN: 100.0000 mL | INTRAVENOUS | Status: DC | PRN

## 2017-08-07 MED ORDER — LIDOCAINE HCL (PF) 1 % IJ SOLN: 5.0000 mL | INTRAMUSCULAR | Status: DC | PRN

## 2017-08-07 MED ORDER — PENTAFLUOROPROP-TETRAFLUOROETH EX AERO: 1.0000 "application " | INHALATION_SPRAY | CUTANEOUS | Status: DC | PRN

## 2017-08-07 MED ORDER — ALTEPLASE 2 MG IJ SOLR: 2.0000 mg | Freq: Once | INTRAMUSCULAR | Status: DC | PRN

## 2017-08-07 MED ORDER — LIDOCAINE-PRILOCAINE 2.5-2.5 % EX CREA
1.0000 "application " | TOPICAL_CREAM | CUTANEOUS | Status: DC | PRN
  Filled 2017-08-07: qty 5

## 2017-08-07 MED ORDER — HEPARIN SODIUM (PORCINE) 1000 UNIT/ML DIALYSIS
1000.0000 [IU] | INTRAMUSCULAR | Status: DC | PRN
  Filled 2017-08-07: qty 1

## 2017-08-07 NOTE — Clinical Social Work Note (Signed)
Clinical Social Work Assessment  Patient Details  Name: Jack [name] MRN: [id number] Date of Birth: October 10, 1945  Date of referral:  08/07/17               Reason for consult:  Discharge Planning, Facility Placement                Permission sought to share information with:  Family Supports Permission granted to share information::  Yes, Verbal Permission Granted  Name::     Hyrum Shaneyfelt  Agency::  snf  Relationship::  daughter  Contact Information:  [phone]  Housing/Transportation Living arrangements for the past 2 months:  [place], [place] of Information:  Patient Patient Interpreter Needed:  None Criminal Activity/Legal Involvement Pertinent to Current Situation/Hospitalization:  No - Comment as needed Significant Relationships:  Adult Children, Other Family Members Lives with:  Self Do you feel safe going back to the place where you live?  Yes Need for family participation in patient care:  Yes (Comment)  Care giving concerns:  CSW met pateint at hemodialysis unit. Patient stated he is tired but would be willing to talk to [place]. Patient lives at home with younger daughter [name]. Patient stated he has support from his other 2 daughter in the area.   Social Worker assessment / plan:  CSW met patietn at bedside to discuss discharge plan and offer support. Patient stated he is needing some more time to come up with a dishcrgae plan. Patient requested that CSW please contact daughters to get there take on where he should go. Patient stated he would like to go home but also stated his two older children have work and he would just be home with his youngest. CSW will follow up with family as patient is having a hard time going with decision of going home or going to snf with palliative    Employment status:  Retired Nurse, adult PT Recommendations:  Not assessed at this time Information / Referral to  community resources:  Other (Comment Required), [place]  Patient/Family's Response to care:  Patient is very overwhelmed with his new diagnoses and understands that he does not have much time. CSW spoke with patient about his emotions towards diagnoses   Patient/Family's Understanding of and Emotional Response to Diagnosis, Current Treatment, and Prognosis:  Will follow up with family for decision   Emotional Assessment Appearance:  Appears stated age Attitude/Demeanor/Rapport:  Engaged, Grieving Affect (typically observed):  Pleasant, Sad Orientation:  Oriented to Place, Oriented to  Time, Oriented to Self Alcohol / Substance use:  Not Applicable Psych involvement (Current and /or in the community):  No (Comment)  Discharge Needs  Concerns to be addressed:  Grief and Loss Concerns, Care Coordination Readmission within the last 30 days:    Current discharge risk:  Terminally ill Barriers to Discharge:  Continued Medical Work up   [employer], LCSW 08/07/2017, 12:57 PM

## 2017-08-07 NOTE — Progress Notes (Signed)
CBG 114 per pt freestyle glucometer

## 2017-08-07 NOTE — NC FL2 (Signed)
[place] LEVEL OF CARE SCREENING TOOL     IDENTIFICATION  Patient Name: Jack [name] Birthdate: 02-16-1945 Sex: male Admission Date (Current Location): 08/01/2017  [facility] and [place] Number:  Herbalist and Address:  [facility] [place] [address] [place], [place] [address]      Provider Number: [id number]  Attending Physician Name and Address:  Mendel Corning, MD  Relative Name and Phone Number:  Georgina Snell daughter, [phone]    Current Level of Care: Hospital Recommended Level of Care: [place] Prior Approval Number:    Date Approved/Denied:   PASRR Number: [id number]  Discharge Plan: SNF    Current Diagnoses: Patient Active Problem List   Diagnosis Date Noted  . Bilious vomiting with nausea   . Omental metastasis ([place])   . Palliative care by specialist   . DNR (do not resuscitate) discussion   . Pain, generalized   . Colon cancer ([place]) 08/02/2017  . GI bleed 08/01/2017  . Dyslipidemia 11/21/2015  . Diabetic retinopathy ([place])- left eye 10/02/2015  . Complications, amputation stump late ([place]) 05/20/2014  . Weakness generalized 04/07/2014  . Sepsis ([place]) 04/07/2014  . Decubitus ulcer, stage II 04/07/2014  . Decubitus ulcer of ankle   . Fatigue   . Wound infection 04/06/2014  . Decubitus ulcer of sacral region, stage 2 03/07/2014  . Glaucoma 03/07/2014  . Gout 03/07/2014  . Below knee amputation status ([place]) 02/18/2014  . Osteomyelitis ([place]) 01/04/2014  . Phantom limb ([place]) 12/14/2013  . Type 2 diabetes mellitus with diabetic foot infection ([place])   . Diabetes mellitus with peripheral vascular disease ([place])   . Asthma 05/17/2013  . S/P bilateral below knee amputation ([place]) 05/17/2013  . History of MRSA infection 04/22/2013  . Peripheral vascular disease ([place]) 11/19/2012  . End-stage renal disease on hemodialysis ([place]) 05/05/2012  . 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 Anemia in  chronic kidney disease 03/19/2012  . Hypothyroidism following radioiodine therapy 03/19/2012  . Anemia, iron deficiency 03/19/2012  . Hypertension 03/18/2012  . Chronic kidney disease (CKD), stage V ([place]) 03/18/2012  . Cardiac conduction disorder 03/18/2012  . MGUS (monoclonal gammopathy of unknown significance) 02/28/2011  . Monoclonal paraproteinemia 02/28/2011    Orientation RESPIRATION BLADDER Height & Weight     Self, Time, Situation, Place  Normal Incontinent Weight: 70.2 kg (154 lb 12.2 oz) Height:  5\' 10"  (177.8 cm)  BEHAVIORAL SYMPTOMS/MOOD NEUROLOGICAL BOWEL NUTRITION STATUS      Incontinent Diet(Please see DC Summary)  AMBULATORY STATUS COMMUNICATION OF NEEDS Skin   Extensive Assist Verbally Surgical wounds(Closed insicion on abdomen and handl)                       Personal Care Assistance Level of Assistance  Bathing, Feeding, Dressing Bathing Assistance: Maximum assistance Feeding assistance: Independent Dressing Assistance: Limited assistance     Functional Limitations Info  Sight, Hearing, Speech Sight Info: Adequate Hearing Info: Adequate Speech Info: Adequate    SPECIAL CARE FACTORS FREQUENCY  PT (By licensed PT), OT (By licensed OT)     PT Frequency: 5x/week OT Frequency: 3x/week            Contractures      Additional Factors Info  Code Status, Allergies Code Status Info: DNR Allergies Info: Bee Venom, Ivp Dye Iodinated Diagnostic Agents, Lisinopril, Morphine And Related           Current Medications (08/07/2017):  This is the  current hospital active medication list Current Facility-Administered Medications  Medication Dose Route Frequency Provider Last Rate Last Dose  . 0.9 %  sodium chloride infusion   Intravenous Continuous PRN Rai, Ripudeep K, MD 10 mL/hr at 08/07/17 1638    . Chlorhexidine Gluconate Cloth 2 % PADS 6 each  6 each Topical Daily Rai, Ripudeep K, MD   6 each at 08/06/17 1159  . Chlorhexidine Gluconate Cloth 2 %  PADS 6 each  6 each Topical Q0600 Roney Jaffe, MD   6 each at 08/07/17 [phone]  . dextrose 5 % solution   Intravenous Continuous Rai, Ripudeep K, MD 40 mL/hr at 08/07/17 1637    . famotidine (PEPCID) IVPB 20 mg premix  20 mg Intravenous Q24H Rai, Ripudeep K, MD 100 mL/hr at 08/07/17 1313 20 mg at 08/07/17 1313  . levothyroxine (SYNTHROID, LEVOTHROID) injection 75 mcg  75 mcg Intravenous Daily Caren Griffins, MD   75 mcg at 08/07/17 1313  . ondansetron (ZOFRAN) injection 4 mg  4 mg Intravenous Q6H PRN Caren Griffins, MD   4 mg at 08/05/17 1708  . oxyCODONE-acetaminophen (PERCOCET/ROXICET) 5-325 MG per tablet 1-2 tablet  1-2 tablet Oral Q6H PRN Rai, Ripudeep K, MD   2 tablet at 08/07/17 1414  . piperacillin-tazobactam (ZOSYN) IVPB 3.375 g  3.375 g Intravenous Q12H Rumbarger, Rachel L, RPH 12.5 mL/hr at 08/07/17 1639 3.375 g at 08/07/17 1639  . polyethylene glycol (MIRALAX / GLYCOLAX) packet 17 g  17 g Oral Once Rai, Vernelle Emerald, MD         Discharge Medications: Please see discharge summary for a list of discharge medications.  Relevant Imaging Results:  Relevant Lab Results:   Additional Information SSN: [id number] Gets HD MWF at [facility] [place]

## 2017-08-07 NOTE — Progress Notes (Signed)
[facility] Progress Note   Subjective:   No new /co Objective Vitals:   08/07/17 1000 08/07/17 1030 08/07/17 1100 08/07/17 1130  BP: (!) 152/71 (!) 142/79 (!) 167/77 138/88  Pulse: 80 81 81 86  Resp:      Temp:      TempSrc:      SpO2:      Weight:      Height:       Physical Exam General:NAD, chronically ill appearing male Heart:RRR Lungs:CTAB anteriorly, nml WOB Extremities:b/l bka, no stump edema, mult bilat finger amps Dialysis Access: R IJ [facility]   Filed Weights   08/05/17 1108 08/06/17 0320 08/07/17 0810  Weight: 68.5 kg (151 lb 0.2 oz) 69.9 kg (154 lb 1.6 oz) 70.1 kg (154 lb 8.7 oz)    Intake/Output Summary (Last 24 hours) at 08/07/2017 1222 Last data filed at 08/07/2017 0601 Gross per 24 hour  Intake 993.85 ml  Output -  Net 993.85 ml    Additional Objective Labs: Basic Metabolic Panel: Recent Labs  Lab 08/04/17 0501 08/05/17 0500 08/07/17 0226  NA 142 136 133*  K 4.4 4.4 3.5  CL 97* 96* 94*  CO2 26 23 23   GLUCOSE 91 117* 135*  BUN 59* 64* 28*  CREATININE 9.79* 9.93* 6.46*  CALCIUM 8.2* 8.0* 7.9*  PHOS  --  6.7* 5.8*   Liver Function Tests: Recent Labs  Lab 08/01/17 1125 08/02/17 0256 08/07/17 0226  AST 21 22  --   ALT 6 <5  --   ALKPHOS 131* 108  --   BILITOT 0.9 1.1  --   PROT 7.8 6.6  --   ALBUMIN 2.9* 2.5* 1.9*   Recent Labs  Lab 08/01/17 1125  LIPASE 23   CBC: Recent Labs  Lab 08/02/17 0256 08/03/17 0435 08/04/17 0501 08/05/17 0500 08/07/17 0226  WBC 13.6* 11.7* 9.1 10.2 8.5  HGB 10.5* 10.2* 9.9* 10.2* 10.2*  HCT 36.0* 34.5* 33.9* 33.9* 34.5*  MCV 84.3 84.1 83.9 82.9 82.9  PLT 297 267 250 248 252   Blood Culture    Component Value Date/Time   SDES BLOOD RIGHT WRIST 04/07/2014 0340   SPECREQUEST BOTTLES DRAWN AEROBIC ONLY 3 CC 04/07/2014 0340   CULT  04/07/2014 0340    NO GROWTH 5 DAYS Performed at [place] 04/13/2014 FINAL 04/07/2014 0340     Lab Results  Component  Value Date   INR 1.52 08/04/2017   INR 1.09 05/20/2014   INR 1.18 04/07/2014   PROTIME 16.8 (H) 05/20/2012   PROTIME 14.4 (H) 05/13/2012   PROTIME 13.2 05/06/2012   Studies/Results: No results found.  Medications: . sodium chloride    . sodium chloride    . sodium chloride    . dextrose 40 mL/hr at 08/07/17 0601  . famotidine (PEPCID) IV Stopped (08/06/17 1228)  . piperacillin-tazobactam (ZOSYN)  IV 3.375 g (08/07/17 0339)   . Chlorhexidine Gluconate Cloth  6 each Topical Daily  . Chlorhexidine Gluconate Cloth  6 each Topical Q0600  . levothyroxine  75 mcg Intravenous Daily  . polyethylene glycol  17 g Oral Once    Dialysis Orders: [facility] 4h   66.5kg  2/2  TDC  Hep 2000 then 1000 midrun - Hectoral 25mcg IV q HD - Mircera 66mcg IV q 2 weeks  Assessment/Plan: 1. Hematemesis - CT ABD +gastric pneumatosis. Surgery consulted - not surgical canidate d/t metastatic colon cancer. Supportive care 2. Metastatic colon cancer, Stage IV -  based on CT findings 7/19. Suspect primary colon CA w/mets to lungs, peritoneal and liver. Oncology consulted and recommend supportive care due to multiple comorbidities. No treatment options.      3. ESRD - HD MWF. Patient off schedule this week.  Will write orders for HD tomorrow and resume regular schedule next week on Monday.  4. Anemia of CKD- Hgb 10.2.  Continue to follow. No indication for ESA at this time.  5. Secondary hyperparathyroidism - Ca ok. Checking Phos.  Resume fosrenol once diet advanced. Cont VDRA. 6. HTN/volume - BP variable. No excess volume on exam. To run even with HD tomorrow.  7. Dispo: Very grim prognosis, Onc predicts survival in days-weeks, likely <67months.  Family discussions have taken place. Patient changed to DNR/DNI but wishes to continue dialysis.   Jen Mow, PA-C [facility] Pager: [phone] 08/07/2017,12:22 PM  LOS: 6 days   Pt seen, examined and agree w A/P as above.  [name]  MD [employer] pager [phone]   08/07/2017, 12:22 PM

## 2017-08-07 NOTE — Progress Notes (Signed)
CBG 80 per pt freestyle glucometer

## 2017-08-07 NOTE — Progress Notes (Signed)
CSW spoke with patient's daughter, Jack [name]. She states that she and her sisters would like to take the patient home. Jack [name] will be able to stay with the patient 24/7. Her other sister will be able to help as well until school starts back in the fall. Patient uses [facility] for transportation to dialysis. Family is interested in having palliative care stay involved with patient upon return home along with home health. CSW will alert RNCM.  Jack Locus Kelina Beauchamp LCSW [phone]

## 2017-08-07 NOTE — Progress Notes (Signed)
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   KETONESUR NEGATIVE 02/23/2013 2008   PROTEINUR >300 (A) 02/23/2013 2008   UROBILINOGEN 0.2 02/23/2013 2008   NITRITE NEGATIVE 02/23/2013 2008   LEUKOCYTESUR LARGE (A) 02/23/2013 2008     Amberly Livas M.D. [facility] 08/07/2017, 12:37 PM  Pager: [phone] Between 7am to 7pm - call Pager - [phone]  After 7pm go to [email or web address] - password [email or web address]  Call night coverage person covering after 7pm

## 2017-08-08 DIAGNOSIS — R1114 Bilious vomiting: Secondary | ICD-10-CM

## 2017-08-08 DIAGNOSIS — Z89512 Acquired absence of left leg below knee: Secondary | ICD-10-CM

## 2017-08-08 DIAGNOSIS — C189 Malignant neoplasm of colon, unspecified: Secondary | ICD-10-CM

## 2017-08-08 DIAGNOSIS — Z992 Dependence on renal dialysis: Secondary | ICD-10-CM

## 2017-08-08 DIAGNOSIS — D631 Anemia in chronic kidney disease: Secondary | ICD-10-CM

## 2017-08-08 DIAGNOSIS — K922 Gastrointestinal hemorrhage, unspecified: Secondary | ICD-10-CM

## 2017-08-08 DIAGNOSIS — N186 End stage renal disease: Secondary | ICD-10-CM

## 2017-08-08 DIAGNOSIS — Z89511 Acquired absence of right leg below knee: Secondary | ICD-10-CM

## 2017-08-08 MED ORDER — LEVOTHYROXINE SODIUM 100 MCG IV SOLR: 75.0000 ug | Freq: Every day | INTRAVENOUS | 0 refills | Status: DC

## 2017-08-08 MED ORDER — POLYETHYLENE GLYCOL 3350 17 G PO PACK: 17.0000 g | PACK | Freq: Once | ORAL | 0 refills | Status: AC

## 2017-08-08 MED ORDER — FAMOTIDINE 20 MG PO TABS
20.0000 mg | ORAL_TABLET | Freq: Every day | ORAL | Status: DC
  Administered 2017-08-09: 20 mg via ORAL
  Filled 2017-08-08: qty 1

## 2017-08-08 MED ORDER — AMOXICILLIN-POT CLAVULANATE 500-125 MG PO TABS: 1.0000 | ORAL_TABLET | Freq: Every day | ORAL | 0 refills | Status: AC

## 2017-08-08 MED ORDER — FAMOTIDINE 20 MG PO TABS: 20.0000 mg | ORAL_TABLET | Freq: Every day | ORAL | 0 refills | Status: AC

## 2017-08-08 MED ORDER — LEVOTHYROXINE SODIUM 75 MCG PO TABS
150.0000 ug | ORAL_TABLET | Freq: Every day | ORAL | Status: DC
  Administered 2017-08-09: 150 ug via ORAL
  Filled 2017-08-08: qty 2

## 2017-08-08 MED ORDER — AMOXICILLIN-POT CLAVULANATE 875-125 MG PO TABS: 1.0000 | ORAL_TABLET | Freq: Two times a day (BID) | ORAL | 0 refills | Status: DC

## 2017-08-08 MED ORDER — CHLORHEXIDINE GLUCONATE CLOTH 2 % EX PADS: 6.0000 | MEDICATED_PAD | Freq: Every day | CUTANEOUS | Status: DC

## 2017-08-08 NOTE — Evaluation (Signed)
Physical Therapy Evaluation Patient Details Name: Jack [name] MRN: [id number] DOB: 04/22/1945 Today's Date: 08/08/2017   History of Present Illness  Jack [name] is a 72 y.o. 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  Patient reported he started having nausea and vomiting, night before the admission and subsequently had bright red blood.  He underwent dialysis but did not complete it and was noticed to have hypotension and was sent to ER for further work-up.  Pt with upper GIB however also found to have CT of the abdomen showed gastric pneumatosis and portal venous gas concerning for gastric ischemia, likelihood of ascending colon cancer with pulmonary omental and liver mets. Pt is not an operative candidate.  Daughters want pt to go to Rehab and then they will take him home as he wants to continue dialysis as well.   Clinical Impression  Pt admitted with above diagnosis. Pt currently with functional limitations due to the deficits listed below (see PT Problem List). Pt needs max assist for all mobility currently.  Will follow acutely.  Will benefit from SNF for rehab to decr burden of care for daughters once pt can go home.   Pt will benefit from skilled PT to increase their independence and safety with mobility to allow discharge to the venue listed below.      Follow Up Recommendations SNF;Supervision/Assistance - 24 hour    Equipment Recommendations  None recommended by PT    Recommendations for Other Services       Precautions / Restrictions Precautions Precautions: Fall Precaution Comments: old bil BKA Required Braces or Orthoses: Other Brace/Splint(bil prosthetics for LEs) Restrictions Weight Bearing  Restrictions: No      Mobility  Bed Mobility Overal bed mobility: Needs Assistance Bed Mobility: Supine to Sit     Supine to sit: Mod assist;Max assist;+2 for physical assistance     General bed mobility comments: mod to max assist to come supine to long sit.    Transfers Overall transfer level: Needs assistance   Transfers: Comptroller transfers: Max assist;+2 physical assistance   General transfer comment: Pt needed max assist with use of pad as he needed assist to balance trunk and UEs assist but difficult due to first time pt has been up in a week.    Ambulation/Gait                Stairs            Wheelchair Mobility    Modified Rankin (Stroke Patients Only)       Balance Overall balance assessment: Needs assistance Sitting-balance support: Bilateral upper extremity supported;Feet supported Sitting balance-Leahy Scale: Poor Sitting balance - Comments: Pt was able to sit with bil UE support with max assist for balance due to posterior lean.   Postural control: Posterior lean                                   Pertinent Vitals/Pain Pain Assessment: No/denies pain    Home Living Family/patient expects to be discharged to:: Private residence Living Arrangements: Children Available Help at Discharge: Family;Available 24 hours/day Type of Home: Apartment Home Access: Level entry     Home Layout:  One level Home Equipment: Wheelchair - [employer] - 4 wheels;Walker - 2 wheels;Toilet riser(trapeze bar) Additional Comments: daughter stays with pt 24 hour/7    Prior Function Level of Independence: Needs assistance   Gait / Transfers Assistance Needed: Pt was able to walk with RW without min guard to supervision per daughter  ADL's / Homemaking Assistance Needed: Pt sponge bathed PTA, needed assist to don prosthetics.        Hand Dominance   Dominant Hand: Right    Extremity/Trunk  Assessment   Upper Extremity Assessment Upper Extremity Assessment: Defer to OT evaluation    Lower Extremity Assessment Lower Extremity Assessment: Overall WFL for tasks assessed    Cervical / Trunk Assessment Cervical / Trunk Assessment: Normal  Communication   Communication: No difficulties  Cognition Arousal/Alertness: Awake/alert Behavior During Therapy: WFL for tasks assessed/performed Overall Cognitive Status: History of cognitive impairments - at baseline                                        General Comments      Exercises General Exercises - Lower Extremity Hip Flexion/Marching: AROM;Both;5 reps;Seated   Assessment/Plan    PT Assessment Patient needs continued PT services  PT Problem List Decreased balance;Decreased mobility;Decreased activity tolerance;Decreased knowledge of use of DME;Decreased safety awareness;Decreased knowledge of precautions       PT Treatment Interventions DME instruction;Gait training;Functional mobility training;Therapeutic activities;Therapeutic exercise;Balance training;Patient/family education    PT Goals (Current goals can be found in the Care Plan section)  Acute Rehab PT Goals Patient Stated Goal: to go home with family  PT Goal Formulation: With patient Time For Goal Achievement: 08/22/17 Potential to Achieve Goals: Good    Frequency Min 3X/week   Barriers to discharge        Co-evaluation               AM-PAC PT "6 Clicks" Daily Activity  Outcome Measure Difficulty turning over in bed (including adjusting bedclothes, sheets and blankets)?: Unable Difficulty moving from lying on back to sitting on the side of the bed? : Unable Difficulty sitting down on and standing up from a chair with arms (e.g., wheelchair, bedside commode, etc,.)?: Unable Help needed moving to and from a bed to chair (including a wheelchair)?: Total Help needed walking in hospital room?: Total Help needed climbing 3-5 steps  with a railing? : Total 6 Click Score: 6    End of Session   Activity Tolerance: Patient limited by fatigue Patient left: in chair;with call bell/phone within reach;with family/visitor present Nurse Communication: Mobility status PT Visit Diagnosis: Muscle weakness (generalized) (M62.81);Other abnormalities of gait and mobility (R26.89)    Time: 7412-8786 PT Time Calculation (min) (ACUTE ONLY): 26 min   Charges:   PT Evaluation $PT Eval Moderate Complexity: 1 Mod PT Treatments $Therapeutic Activity: 8-22 mins        [place] [phone] (pager)   Denice Paradise 08/08/2017, 1:16 PM

## 2017-08-08 NOTE — Discharge Summary (Addendum)
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    Component Value Date/Time   COLORURINE YELLOW 02/23/2013 2008   APPEARANCEUR CLOUDY (A) 02/23/2013 2008   LABSPEC 1.019 02/23/2013 2008   PHURINE 6.0 02/23/2013 2008   GLUCOSEU NEGATIVE 02/23/2013 2008   HGBUR LARGE (A) 02/23/2013 2008   BILIRUBINUR SMALL (A) 02/23/2013 2008   [place] NEGATIVE 02/23/2013 2008   PROTEINUR >300 (A) 02/23/2013 2008   UROBILINOGEN 0.2 02/23/2013 2008   NITRITE NEGATIVE 02/23/2013 2008   LEUKOCYTESUR LARGE (A) 02/23/2013 2008   Sepsis Labs Invalid input(s): PROCALCITONIN,  WBC,  LACTICIDVEN Microbiology Recent Results (from the past 240 hour(s))  MRSA PCR Screening     Status: None   Collection Time: 08/01/17  4:24 PM  Result Value Ref Range Status   MRSA by PCR NEGATIVE NEGATIVE Final    Comment:        The GeneXpert MRSA Assay (FDA approved for NASAL specimens only), is one component of a comprehensive MRSA colonization surveillance program. It is not intended to diagnose MRSA infection nor to guide or monitor treatment for MRSA  infections. Performed at [facility], [place] [address], [place], [place] [address]      Time coordinating discharge: 34  minutes  SIGNED:   Georgette Shell, MD  [facility] 08/08/2017, 3:45 PM Pager   If 7PM-7AM, please contact night-coverage [email or web address] Password [email or web address]

## 2017-08-08 NOTE — Progress Notes (Addendum)
3:36pm-[facility] has insurance approval for patient to admit this weekend when ready.   1:28pm-[facility] unable to accept patient. Patient and daughters have now requested [facility] as their second choice. They are aware that they do not transport patients to dialysis and that patient will be required to use [facility]. [facility] checking referral.   12:33pm-CSW and RNCM spoke with patient and daughters at bedside by request. They stated that patient would like to get rehab at [facility]. CSW provided facility list. First choice is [facility]. CSW will ask them to review and start insurance authorization if able.   Percell Locus Shawndra Clute LCSW [phone]

## 2017-08-08 NOTE — Progress Notes (Signed)
[facility] Progress Note   Subjective:   No new /co Objective Vitals:   08/08/17 0840 08/08/17 1100 08/08/17 1129 08/08/17 1430  BP: (!) 107/45  (!) 110/56   Pulse: 69  64   Resp: (!) 0 (!) 9 14   Temp: 97.9 F (36.6 C)   (!) 97.5 F (36.4 C)  TempSrc: Oral   Oral  SpO2: 100%  98%   Weight:      Height:       Physical Exam General:NAD, chronically ill appearing male Heart:RRR Lungs:CTAB anteriorly, nml WOB Extremities:b/l bka, no stump edema, mult bilat finger amps Dialysis Access: R [facility]   Filed Weights   08/07/17 0810 08/07/17 1214 08/08/17 0157  Weight: 70.1 kg (154 lb 8.7 oz) 70.2 kg (154 lb 12.2 oz) 69.5 kg (153 lb 3.5 oz)    Intake/Output Summary (Last 24 hours) at 08/08/2017 1544 Last data filed at 08/08/2017 1245 Gross per 24 hour  Intake 240 ml  Output 180 ml  Net 60 ml    Additional Objective Labs: Basic Metabolic Panel: Recent Labs  Lab 08/04/17 0501 08/05/17 0500 08/07/17 0226  NA 142 136 133*  K 4.4 4.4 3.5  CL 97* 96* 94*  CO2 26 23 23   GLUCOSE 91 117* 135*  BUN 59* 64* 28*  CREATININE 9.79* 9.93* 6.46*  CALCIUM 8.2* 8.0* 7.9*  PHOS  --  6.7* 5.8*   Liver Function Tests: Recent Labs  Lab 08/02/17 0256 08/07/17 0226  AST 22  --   ALT <5  --   ALKPHOS 108  --   BILITOT 1.1  --   PROT 6.6  --   ALBUMIN 2.5* 1.9*   No results for input(s): LIPASE, AMYLASE in the last 168 hours. CBC: Recent Labs  Lab 08/02/17 0256 08/03/17 0435 08/04/17 0501 08/05/17 0500 08/07/17 0226  WBC 13.6* 11.7* 9.1 10.2 8.5  HGB 10.5* 10.2* 9.9* 10.2* 10.2*  HCT 36.0* 34.5* 33.9* 33.9* 34.5*  MCV 84.3 84.1 83.9 82.9 82.9  PLT 297 267 250 248 252   Blood Culture    Component Value Date/Time   SDES BLOOD RIGHT WRIST 04/07/2014 0340   SPECREQUEST BOTTLES DRAWN AEROBIC ONLY 3 CC 04/07/2014 0340   CULT  04/07/2014 0340    NO GROWTH 5 DAYS Performed at [place] 04/13/2014 FINAL 04/07/2014 0340     Lab  Results  Component Value Date   INR 1.52 08/04/2017   INR 1.09 05/20/2014   INR 1.18 04/07/2014   PROTIME 16.8 (H) 05/20/2012   PROTIME 14.4 (H) 05/13/2012   PROTIME 13.2 05/06/2012   Studies/Results: No results found.  Medications: . sodium chloride 10 mL/hr at 08/07/17 1638  . dextrose 40 mL/hr at 08/08/17 0600  . piperacillin-tazobactam (ZOSYN)  IV 3.375 g (08/08/17 1437)   . Chlorhexidine Gluconate Cloth  6 each Topical Daily  . Chlorhexidine Gluconate Cloth  6 each Topical Q0600  . [START ON 08/09/2017] famotidine  20 mg Oral Daily  . [START ON 08/09/2017] levothyroxine  150 mcg Oral QAC breakfast  . polyethylene glycol  17 g Oral Once    Dialysis Orders: [facility]WF [facility] 4h   66.5kg  2/2  TDC  Hep 2000 then 1000 midrun - Hectoral 41mcg IV q HD - Mircera 66mcg IV q 2 weeks  Assessment/Plan: 1. Hematemesis - CT ABD +gastric pneumatosis. Surgery consulted - not surgical candidate d/t metastatic colon cancer. Supportive care 2. Metastatic colon cancer, Stage IV -  based on CT findings 7/19. Suspect primary colon CA w/mets to lungs, peritoneal and liver. Oncology consulted and recommend supportive care due to multiple comorbidities. No treatment options.      3. ESRD - HD MWF. Patient off schedule this week.  Plan HD Sat then resume MWF schedule.  4. Anemia of CKD- Hgb 10.2.  Continue to follow. No indication for ESA at this time.  5. Secondary hyperparathyroidism - Ca ok. Checking Phos.  Resume fosrenol once diet advanced. Cont VDRA. 6. HTN/volume - BP variable. No excess volume on exam. To run even with HD tomorrow.  7. Dispo: stable for dc from renal standpoint.     [name] MD [employer] pager [phone]   08/08/2017, 3:44 PM

## 2017-08-08 NOTE — Progress Notes (Signed)
08/08/17 1600  PT Visit Information  Last PT Received On 08/08/17  Assistance Needed +2  History of Present Illness Jack [name] is a 72 y.o. 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  Patient reported he started having nausea and vomiting, night before the admission and subsequently had bright red blood.  He underwent dialysis but did not complete it and was noticed to have hypotension and was sent to ER for further work-up.  Pt with upper GIB however also found to have CT of the abdomen showed gastric pneumatosis and portal venous gas concerning for gastric ischemia, likelihood of ascending colon cancer with pulmonary omental and liver mets. Pt is not an operative candidate.  Daughters want pt to go to Rehab and then they will take him home as he wants to continue dialysis as well.   Precautions  Precautions Fall  Precaution Comments old bil BKA  Required Braces or Orthoses Other Brace/Splint (bil prosthetics for LEs that daughter to bring by Monday)  Restrictions  Weight Bearing Restrictions No  Pain Assessment  Pain Assessment Faces  Faces Pain Scale 6  Pain Location neck  Pain Descriptors / Indicators Aching;Sore  Pain Intervention(s) Limited activity within patient's tolerance;Monitored during session;Repositioned  Cognition  Arousal/Alertness Awake/alert  Behavior During Therapy WFL for tasks assessed/performed  Overall Cognitive Status History of cognitive impairments - at baseline  Bed Mobility  Overal bed mobility Needs Assistance  Bed Mobility Sit to Supine  Sit to supine Mod assist;+2 for physical assistance  General bed mobility comments Assisted back to supine after anterior transfer back into bed from  recliner  Transfers  Overall transfer level Needs assistance  Transfers Anterior-Posterior Transfer  Anterior-Posterior transfers Max assist;+2 physical assistance  General transfer comment Pt needed max assist with use of pad as he needed assist to balance trunk and UEs assist with pt helping a little more with the lifting than he did to get into chair.  Pt also with less posterior lean than previous treatment.   Balance  Overall balance assessment Needs assistance  Sitting-balance support Bilateral upper extremity supported;Feet supported  Sitting balance-Leahy Scale Poor  Sitting balance - Comments Pt was able to sit with bil UE support with max assist for balance due to posterior lean.    Postural control Posterior lean  PT - End of Session  Activity Tolerance Patient limited by fatigue;Patient limited by pain  Patient left with call bell/phone within reach;with family/visitor present;in bed  Nurse Communication Mobility status;Patient requests pain meds   PT - Assessment/Plan  PT Plan Current plan remains appropriate  PT Visit Diagnosis Muscle weakness (generalized) (M62.81);Other abnormalities of gait and mobility (R26.89)  PT Frequency (ACUTE ONLY) Min 3X/week  Follow Up Recommendations SNF;Supervision/Assistance - 24 hour  PT equipment None recommended by PT  AM-PAC PT "6 Clicks" Daily Activity Outcome Measure  Difficulty turning over in bed (including adjusting bedclothes, sheets and blankets)? 1  Difficulty moving from lying on back to sitting on the side of the bed?  1  Difficulty sitting down on and standing up from a chair with arms (e.g., wheelchair, bedside commode, etc,.)? 1  Help needed moving to and from a bed to chair (including a wheelchair)? 1  Help needed walking in hospital room? 1  Help needed climbing 3-5 steps  with a railing?  1  6 Click Score 6  Mobility G Code  CN  PT Goal Progression  Progress towards PT goals Progressing toward goals  PT Time Calculation   PT Start Time (ACUTE ONLY) 1320  PT Stop Time (ACUTE ONLY) 1336  PT Time Calculation (min) (ACUTE ONLY) 16 min  PT General Charges  $$ ACUTE PT VISIT 1 Visit  PT Treatments  $Therapeutic Activity 8-22 mins  Called by nursing that pt was tired after sitting 30 min in chair and they wanted assist to get pt back in bed due to being unfamiliar with anterior transfer back to bed.  PT assisted pt back into bed with NT assist as well.  Pt comfortable in bed with pillows.  Willfollow acutely. [place] [phone] (pager)

## 2017-08-08 NOTE — Care Management Note (Signed)
Case Management Note Marvetta Gibbons RN, BSN Unit 4E-Case Manager [phone]  Patient Details  Name: Jack [name] MRN: [id number] Date of Birth: 1945/03/12  Subjective/Objective:    Pt admitted with GIB, on further workup new diagnosis of metastatic cancer found. PC consulted for [facility] as pt not a good candidate for chemotherapy per oncology with significant comorbidities.            Action/Plan: PTA pt lived at home with family, hx of ESRD on HD M/W/F, hx of BIL BKAs- per pt he wants to continue HD for now, has made himself DNR. CSW and CM in to speak with pt and daughters at the bedside for transition of care needs. Discussion on STSNF vs Home with [facility] vs Home with Hospice was had with explanation of each and the services that would come with each. Per pt he would really like to try rehab at SNF to try to get back to his baseline. Explained that insurance would need to approve SNF stay and if approval was not received option would be to pay out of pocket vs return home with either [facility] or home hospice. CSW to attempt SNF approval has pt and family preferences for beds. CM will follow for possible HH needs if SNF not approved- pt and family made aware that pt is medically stable for transition when bed available at SNF or if not approved to move forward with arrangements to home. PT in to see pt for eval.   Expected Discharge Date:                  Expected Discharge Plan:  [place]  In-House Referral:  Clinical Social Work  Discharge planning Services  CM Consult  Post Acute Care Choice:    Choice offered to:     DME Arranged:    DME Agency:     HH Arranged:    [place] Agency:     Status of Service:  In process, will continue to follow  If discussed at Long Length of Stay Meetings, dates discussed:    Discharge Disposition:   Additional Comments:  Dawayne Patricia, RN 08/08/2017, 1:42 PM

## 2017-08-08 NOTE — Care Management Important Message (Signed)
Important Message  Patient Details  Name: Loranzo Desha MRN: [id number] Date of Birth: [birth year]   Medicare Important Message Given:  Yes    Barb Merino Greydis Stlouis 08/08/2017, 3:34 PM

## 2017-08-09 DIAGNOSIS — R58 Hemorrhage, not elsewhere classified: Secondary | ICD-10-CM | POA: Diagnosis not present

## 2017-08-09 DIAGNOSIS — D509 Iron deficiency anemia, unspecified: Secondary | ICD-10-CM | POA: Diagnosis not present

## 2017-08-09 DIAGNOSIS — D649 Anemia, unspecified: Secondary | ICD-10-CM | POA: Diagnosis not present

## 2017-08-09 DIAGNOSIS — G546 Phantom limb syndrome with pain: Secondary | ICD-10-CM | POA: Diagnosis not present

## 2017-08-09 DIAGNOSIS — M5136 Other intervertebral disc degeneration, lumbar region: Secondary | ICD-10-CM | POA: Diagnosis not present

## 2017-08-09 DIAGNOSIS — N19 Unspecified kidney failure: Secondary | ICD-10-CM | POA: Diagnosis not present

## 2017-08-09 DIAGNOSIS — T82598A Other mechanical complication of other cardiac and vascular devices and implants, initial encounter: Secondary | ICD-10-CM | POA: Diagnosis not present

## 2017-08-09 DIAGNOSIS — I120 Hypertensive chronic kidney disease with stage 5 chronic kidney disease or end stage renal disease: Secondary | ICD-10-CM | POA: Diagnosis not present

## 2017-08-09 DIAGNOSIS — E1029 Type 1 diabetes mellitus with other diabetic kidney complication: Secondary | ICD-10-CM | POA: Diagnosis not present

## 2017-08-09 DIAGNOSIS — N2581 Secondary hyperparathyroidism of renal origin: Secondary | ICD-10-CM | POA: Diagnosis not present

## 2017-08-09 DIAGNOSIS — Z7401 Bed confinement status: Secondary | ICD-10-CM | POA: Diagnosis not present

## 2017-08-09 DIAGNOSIS — K5900 Constipation, unspecified: Secondary | ICD-10-CM | POA: Diagnosis not present

## 2017-08-09 DIAGNOSIS — E039 Hypothyroidism, unspecified: Secondary | ICD-10-CM | POA: Diagnosis not present

## 2017-08-09 DIAGNOSIS — L98499 Non-pressure chronic ulcer of skin of other sites with unspecified severity: Secondary | ICD-10-CM | POA: Diagnosis not present

## 2017-08-09 DIAGNOSIS — K922 Gastrointestinal hemorrhage, unspecified: Secondary | ICD-10-CM | POA: Diagnosis not present

## 2017-08-09 DIAGNOSIS — L89153 Pressure ulcer of sacral region, stage 3: Secondary | ICD-10-CM | POA: Diagnosis not present

## 2017-08-09 DIAGNOSIS — Z79899 Other long term (current) drug therapy: Secondary | ICD-10-CM | POA: Diagnosis not present

## 2017-08-09 DIAGNOSIS — D689 Coagulation defect, unspecified: Secondary | ICD-10-CM | POA: Diagnosis not present

## 2017-08-09 DIAGNOSIS — Z515 Encounter for palliative care: Secondary | ICD-10-CM | POA: Diagnosis not present

## 2017-08-09 DIAGNOSIS — C189 Malignant neoplasm of colon, unspecified: Secondary | ICD-10-CM | POA: Diagnosis not present

## 2017-08-09 DIAGNOSIS — C9000 Multiple myeloma not having achieved remission: Secondary | ICD-10-CM | POA: Diagnosis not present

## 2017-08-09 DIAGNOSIS — R1084 Generalized abdominal pain: Secondary | ICD-10-CM | POA: Diagnosis not present

## 2017-08-09 DIAGNOSIS — E1151 Type 2 diabetes mellitus with diabetic peripheral angiopathy without gangrene: Secondary | ICD-10-CM | POA: Diagnosis not present

## 2017-08-09 DIAGNOSIS — C7800 Secondary malignant neoplasm of unspecified lung: Secondary | ICD-10-CM | POA: Diagnosis not present

## 2017-08-09 DIAGNOSIS — I10 Essential (primary) hypertension: Secondary | ICD-10-CM | POA: Diagnosis not present

## 2017-08-09 DIAGNOSIS — Z139 Encounter for screening, unspecified: Secondary | ICD-10-CM | POA: Diagnosis not present

## 2017-08-09 DIAGNOSIS — R112 Nausea with vomiting, unspecified: Secondary | ICD-10-CM | POA: Diagnosis not present

## 2017-08-09 DIAGNOSIS — E1122 Type 2 diabetes mellitus with diabetic chronic kidney disease: Secondary | ICD-10-CM | POA: Diagnosis not present

## 2017-08-09 DIAGNOSIS — R5381 Other malaise: Secondary | ICD-10-CM | POA: Diagnosis not present

## 2017-08-09 DIAGNOSIS — R269 Unspecified abnormalities of gait and mobility: Secondary | ICD-10-CM | POA: Diagnosis not present

## 2017-08-09 DIAGNOSIS — N186 End stage renal disease: Secondary | ICD-10-CM | POA: Diagnosis not present

## 2017-08-09 DIAGNOSIS — E119 Type 2 diabetes mellitus without complications: Secondary | ICD-10-CM | POA: Diagnosis not present

## 2017-08-09 DIAGNOSIS — L89890 Pressure ulcer of other site, unstageable: Secondary | ICD-10-CM | POA: Diagnosis not present

## 2017-08-09 DIAGNOSIS — M2550 Pain in unspecified joint: Secondary | ICD-10-CM | POA: Diagnosis not present

## 2017-08-09 DIAGNOSIS — Z992 Dependence on renal dialysis: Secondary | ICD-10-CM | POA: Diagnosis not present

## 2017-08-09 DIAGNOSIS — I739 Peripheral vascular disease, unspecified: Secondary | ICD-10-CM | POA: Diagnosis not present

## 2017-08-09 DIAGNOSIS — M6281 Muscle weakness (generalized): Secondary | ICD-10-CM | POA: Diagnosis not present

## 2017-08-09 DIAGNOSIS — G893 Neoplasm related pain (acute) (chronic): Secondary | ICD-10-CM | POA: Diagnosis not present

## 2017-08-09 DIAGNOSIS — R509 Fever, unspecified: Secondary | ICD-10-CM | POA: Diagnosis not present

## 2017-08-09 DIAGNOSIS — D631 Anemia in chronic kidney disease: Secondary | ICD-10-CM | POA: Diagnosis not present

## 2017-08-09 DIAGNOSIS — G8929 Other chronic pain: Secondary | ICD-10-CM | POA: Diagnosis not present

## 2017-08-09 LAB — CBC WITH DIFFERENTIAL/PLATELET
ABS IMMATURE GRANULOCYTES: 0.1 10*3/uL (ref 0.0–0.1)
BASOS ABS: 0.1 10*3/uL (ref 0.0–0.1)
Basophils Relative: 1 %
Eosinophils Absolute: 0.3 10*3/uL (ref 0.0–0.7)
Eosinophils Relative: 4 %
HCT: 34.8 % — ABNORMAL LOW (ref 39.0–52.0)
Hemoglobin: 10.2 g/dL — ABNORMAL LOW (ref 13.0–17.0)
Immature Granulocytes: 1 %
Lymphocytes Relative: 17 %
Lymphs Abs: 1.5 10*3/uL (ref 0.7–4.0)
MCH: 24.4 pg — ABNORMAL LOW (ref 26.0–34.0)
MCHC: 29.3 g/dL — ABNORMAL LOW (ref 30.0–36.0)
MCV: 83.3 fL (ref 78.0–100.0)
Monocytes Absolute: 1 10*3/uL (ref 0.1–1.0)
Monocytes Relative: 12 %
NEUTROS ABS: 5.7 10*3/uL (ref 1.7–7.7)
Neutrophils Relative %: 65 %
Platelets: 303 10*3/uL (ref 150–400)
RBC: 4.18 MIL/uL — ABNORMAL LOW (ref 4.22–5.81)
RDW: 16.4 % — ABNORMAL HIGH (ref 11.5–15.5)
WBC: 8.7 10*3/uL (ref 4.0–10.5)

## 2017-08-09 LAB — RENAL FUNCTION PANEL
Albumin: 1.8 g/dL — ABNORMAL LOW (ref 3.5–5.0)
Anion gap: 12 (ref 5–15)
BUN: 16 mg/dL (ref 8–23)
CO2: 26 mmol/L (ref 22–32)
CREATININE: 5.21 mg/dL — AB (ref 0.61–1.24)
Calcium: 7.9 mg/dL — ABNORMAL LOW (ref 8.9–10.3)
Chloride: 94 mmol/L — ABNORMAL LOW (ref 98–111)
GFR calc non Af Amer: 10 mL/min — ABNORMAL LOW (ref >60)
GFR, EST AFRICAN AMERICAN: 11 mL/min — AB (ref >60)
Glucose, Bld: 110 mg/dL — ABNORMAL HIGH (ref 70–99)
Phosphorus: 4.8 mg/dL — ABNORMAL HIGH (ref 2.5–4.6)
Potassium: 3.1 mmol/L — ABNORMAL LOW (ref 3.5–5.1)
Sodium: 132 mmol/L — ABNORMAL LOW (ref 135–145)

## 2017-08-09 MED ORDER — OXYCODONE-ACETAMINOPHEN 5-325 MG PO TABS
ORAL_TABLET | ORAL | Status: AC
  Administered 2017-08-09: 2
  Filled 2017-08-09: qty 2

## 2017-08-09 MED ORDER — HEPARIN SODIUM (PORCINE) 1000 UNIT/ML DIALYSIS: 1500.0000 [IU] | INTRAMUSCULAR | Status: DC | PRN

## 2017-08-09 NOTE — Progress Notes (Signed)
[facility] Progress Note   Subjective:   No new /co Objective Vitals:   08/08/17 2005 08/09/17 0001 08/09/17 0417 08/09/17 0717  BP: 104/71 (!) 158/89 (!) 157/85 (!) 113/37  Pulse: 64 68 83 (!) 58  Resp: 11 20 18 20   Temp: 98.5 F (36.9 C) 98.4 F (36.9 C) 97.9 F (36.6 C) 97.6 F (36.4 C)  TempSrc: Oral Oral Oral Oral  SpO2: 100% 95% 94% 93%  Weight:   74.5 kg (164 lb 3.9 oz)   Height:       Physical Exam General:NAD, chronically ill appearing male Heart:RRR Lungs:CTAB anteriorly, nml WOB Extremities:b/l bka, no stump edema, mult bilat finger amps Dialysis Access: R IJ [facility]   Filed Weights   08/07/17 1214 08/08/17 0157 08/09/17 0417  Weight: 70.2 kg (154 lb 12.2 oz) 69.5 kg (153 lb 3.5 oz) 74.5 kg (164 lb 3.9 oz)    Intake/Output Summary (Last 24 hours) at 08/09/2017 0815 Last data filed at 08/09/2017 0717 Gross per 24 hour  Intake 2750.58 ml  Output -  Net 2750.58 ml    Additional Objective Labs: Basic Metabolic Panel: Recent Labs  Lab 08/04/17 0501 08/05/17 0500 08/07/17 0226  NA 142 136 133*  K 4.4 4.4 3.5  CL 97* 96* 94*  CO2 26 23 23   GLUCOSE 91 117* 135*  BUN 59* 64* 28*  CREATININE 9.79* 9.93* 6.46*  CALCIUM 8.2* 8.0* 7.9*  PHOS  --  6.7* 5.8*   Liver Function Tests: Recent Labs  Lab 08/07/17 0226  ALBUMIN 1.9*   No results for input(s): LIPASE, AMYLASE in the last 168 hours. CBC: Recent Labs  Lab 08/03/17 0435 08/04/17 0501 08/05/17 0500 08/07/17 0226  WBC 11.7* 9.1 10.2 8.5  HGB 10.2* 9.9* 10.2* 10.2*  HCT 34.5* 33.9* 33.9* 34.5*  MCV 84.1 83.9 82.9 82.9  PLT 267 250 248 252   Blood Culture    Component Value Date/Time   SDES BLOOD RIGHT WRIST 04/07/2014 0340   SPECREQUEST BOTTLES DRAWN AEROBIC ONLY 3 CC 04/07/2014 0340   CULT  04/07/2014 0340    NO GROWTH 5 DAYS Performed at [place] 04/13/2014 FINAL 04/07/2014 0340     Lab Results  Component Value Date   INR 1.52  08/04/2017   INR 1.09 05/20/2014   INR 1.18 04/07/2014   PROTIME 16.8 (H) 05/20/2012   PROTIME 14.4 (H) 05/13/2012   PROTIME 13.2 05/06/2012   Studies/Results: No results found.  Medications: . sodium chloride 10 mL/hr at 08/09/17 0307  . piperacillin-tazobactam (ZOSYN)  IV 3.375 g (08/09/17 0324)   . Chlorhexidine Gluconate Cloth  6 each Topical Daily  . Chlorhexidine Gluconate Cloth  6 each Topical Q0600  . famotidine  20 mg Oral Daily  . levothyroxine  150 mcg Oral QAC breakfast  . polyethylene glycol  17 g Oral Once    Dialysis Orders: MWF [facility] 4h   66.5kg  2/2  TDC  Hep 2000 then 1000 midrun - Hectoral 61mcg IV q HD - Mircera 73mcg IV q 2 weeks  Assessment/Plan: 1. Hematemesis - CT ABD +gastric pneumatosis. Surgery consulted - not surgical candidate d/t metastatic colon cancer. Supportive care 2. Metastatic colon cancer, Stage IV - based on CT findings 7/19. Suspect primary colon CA w/mets to lungs, peritoneal and liver. Oncology consulted and recommend supportive care due to multiple comorbidities. No treatment options.      3. ESRD - HD MWF. Patient off schedule this week. HD today  then MWF.  4. Anemia of CKD- Hgb 10.2.  Continue to follow. No indication for ESA 5. Secondary hyperparathyroidism - Ca ok. Checking Phos.  Resume fosrenol once diet advanced. Cont VDRA. 6. HTN/volume - BP variable. No excess volume on exam. To run even with HD tomorrow.  7.  DNR 8.  Debility - awaiting SNF placement, possibly today    [name] MD [place] pager [phone]   08/09/2017, 8:15 AM

## 2017-08-09 NOTE — Progress Notes (Signed)
Clinical Social Worker facilitated patient discharge including contacting patient family and facility to confirm patient discharge plans.  Clinical information faxed to facility and family agreeable with plan.  CSW arranged ambulance transport via [facility] to [facility] .  RN to call for report prior to discharge [phone]  rm [phone]  Clinical Social Worker will sign off for now as social work intervention is no longer needed. Please consult [place] again if new need arises.  Mahamad [employer] [phone]

## 2017-08-09 NOTE — Progress Notes (Signed)
Patient in a stable coNDITION, discharge education reviewed with patient and his daughters at bedside, they verbalized understanding , IV removed tele dc ccmd notfied, patient belongings at bedside, patient to be transported to [place] by [facility].

## 2017-08-09 NOTE — Progress Notes (Signed)
PROGRESS NOTE    Jack [name]  XJO:[id number] DOB: 02/08/1945 DOA: 08/01/2017 PCP: Martinique, Betty G, MD  Brief Narrative::72 y.o.malewith medical history significant ofend-stage renal disease, diabetes mellitus, bilateral amputee, significant peripheral vascular disease who was just recently hospitalized 3 days ago underwent surgery for left ring finger amputation due to gangrene presented with hypotension, nausea and vomiting. Patient reported he started having nausea and vomiting, night before the admission and subsequently had bright red blood. He underwent dialysis but did not complete it and was noticed to have hypotension and was sent to ER for further work-up. CT of the abdomen showed gastric pneumatosis and portal venous gas concerning for gastric ischemia, likelihood of ascending colon cancer with pulmonary omental and liver mets. General surgery was consulted. He was deemed not an operative candidate.     Assessment & Plan:   Principal Problem:   GI bleed Active Problems:   Hypertension   Anemia in chronic kidney disease   End-stage renal disease on hemodialysis (HCC)   S/P bilateral below knee amputation (HCC)   Anemia, iron deficiency   Below knee amputation status ([place])   Colon cancer ([place])   Palliative care by specialist   DNR (do not resuscitate) discussion   Pain, generalized   Bilious vomiting with nausea   Omental metastasis ([place])  Upper GI bleed presenting with hematemesis secondary to gastric pneumatosis -CT of the abdomen showed gastric pneumatosis and portal venous gas concerning for gastric ischemia and possible metastatic colon CA -General surgery was consulted, recommended conservative management, NG tube, bowel rest and n.p.o. Unfortunately not an operative candidate at this time if he were to worsen.  -He received Zosyn for a total of 6 days will change to Augmentin 500 mg daily for 5 days upon discharge. -tolerating solids  Active  Problems:  Metastatic likely colon cancer, new diagnosis, mets to lung, omentum, liver -CT abdomen showed findings consistent with carcinoma of the ascending colon, innumerable pulmonary metastasis, omental metastasis, single liver met. States did not have colonoscopy in the past. -GI was consulted, seen by Dr. Paulita Fujita, recommended CT-guided omental biopsy  -Biopsyshowedadenocarcinoma with necrosis consistent with colorectal malignancy -Appreciate oncology recommendations and palliative goals of care. Dr Alvy Bimler discussed with the patient and family, overall poor prognosis, less than 3 months, no palliative systemic chemotherapy due to significant risk of side effects including sepsis and death with multiple significant comorbidities.Patient wants to continue HD for now, DNR/DNI. -Patient requesting for skilled nursing facility and to be able to transport to HD.  ESRD on hemodialysis ([place]) on HD MWF -Nephrology following, for now continue hemodialysis per patient's wishes, poor prognosis  Hypertension -BP stable  S/P bilateral below knee amputation ([place]) -No acute issues, continue pain control  Anemia, iron deficiency and anemia of chronic disease -H&H stable  Diabetes mellitus type II -CBGs currently stable  Hypothyroidism -Continue Synthroid   Recent left ring finger surgery for gangrene, status post amputation on 7/16 -Appreciate Dr. Fredna Dow for following up on the patient, dressing changed        DVT prophylaxis SCD Code Status DO NOT RESUSCITATE Disposition Plan: Discharge to SNF when bed available Consultants:   Nephrology  Procedures: CT abdomen and pelvis and CT-guided omental biopsy 722 Antimicrobials Augmentin Zosyn was stopped today. Subjective: Resting in bed in no acute distress waiting to go to the SNF denies any chest pain shortness of breath or diarrhea.  Any nausea or vomiting  Objective: Vitals:   08/09/17 0915 08/09/17 0945 08/09/17  1015  08/09/17 1045  BP: (!) 107/56 (!) 142/68 92/66 123/62  Pulse: 81 89 93 66  Resp: 16 16    Temp:      TempSrc:      SpO2:    97%  Weight:      Height:        Intake/Output Summary (Last 24 hours) at 08/09/2017 1127 Last data filed at 08/09/2017 0717 Gross per 24 hour  Intake 2750.58 ml  Output -  Net 2750.58 ml   Filed Weights   08/08/17 0157 08/09/17 0417 08/09/17 0730  Weight: 69.5 kg (153 lb 3.5 oz) 74.5 kg (164 lb 3.9 oz) 74.5 kg (164 lb 3.9 oz)    Examination:  General exam: Appears calm and comfortable  Respiratory system: Clear to auscultation. Respiratory effort normal. Cardiovascular system: S1 & S2 heard, RRR. No JVD, murmurs, rubs, gallops or clicks. No pedal edema. Gastrointestinal system: Abdomen is nondistended, soft and nontender. No organomegaly or masses felt. Normal bowel sounds heard. Central nervous system: Alert and oriented. No focal neurological deficits. Extremities: Symmetric 5 x 5 power. Skin: No rashes, lesions or ulcers Psychiatry: Judgement and insight appear normal. Mood & affect appropriate.     Data Reviewed: I have personally reviewed following labs and imaging studies  CBC: Recent Labs  Lab 08/03/17 0435 08/04/17 0501 08/05/17 0500 08/07/17 0226 08/09/17 0829  WBC 11.7* 9.1 10.2 8.5 8.7  NEUTROABS  --   --   --   --  5.7  HGB 10.2* 9.9* 10.2* 10.2* 10.2*  HCT 34.5* 33.9* 33.9* 34.5* 34.8*  MCV 84.1 83.9 82.9 82.9 83.3  PLT 267 250 248 252 303   Basic Metabolic Panel: Recent Labs  Lab 08/03/17 0435 08/04/17 0501 08/05/17 0500 08/07/17 0226 08/09/17 0830  NA 142 142 136 133* 132*  K 4.8 4.4 4.4 3.5 3.1*  CL 100 97* 96* 94* 94*  CO2 23 26 23 23 26  GLUCOSE 99 91 117* 135* 110*  BUN 49* 59* 64* 28* 16  CREATININE 8.80* 9.79* 9.93* 6.46* 5.21*  CALCIUM 8.4* 8.2* 8.0* 7.9* 7.9*  PHOS  --   --  6.7* 5.8* 4.8*   GFR: Estimated Creatinine Clearance: 12.8 mL/min (A) (by C-G formula based on SCr of 5.21 mg/dL  (H)). Liver Function Tests: Recent Labs  Lab 08/07/17 0226 08/09/17 0830  ALBUMIN 1.9* 1.8*   No results for input(s): LIPASE, AMYLASE in the last 168 hours. No results for input(s): AMMONIA in the last 168 hours. Coagulation Profile: Recent Labs  Lab 08/04/17 0501  INR 1.52   Cardiac Enzymes: No results for input(s): CKTOTAL, CKMB, CKMBINDEX, TROPONINI in the last 168 hours. BNP (last 3 results) No results for input(s): PROBNP in the last 8760 hours. HbA1C: No results for input(s): HGBA1C in the last 72 hours. CBG: No results for input(s): GLUCAP in the last 168 hours. Lipid Profile: No results for input(s): CHOL, HDL, LDLCALC, TRIG, CHOLHDL, LDLDIRECT in the last 72 hours. Thyroid Function Tests: No results for input(s): TSH, T4TOTAL, FREET4, T3FREE, THYROIDAB in the last 72 hours. Anemia Panel: No results for input(s): VITAMINB12, FOLATE, FERRITIN, TIBC, IRON, RETICCTPCT in the last 72 hours. Sepsis Labs: No results for input(s): PROCALCITON, LATICACIDVEN in the last 168 hours.  Recent Results (from the past 240 hour(s))  MRSA PCR Screening     Status: None   Collection Time: 08/01/17  4:24 PM  Result Value Ref Range Status   MRSA by PCR NEGATIVE NEGATIVE Final    Comment:          The GeneXpert MRSA Assay (FDA approved for NASAL specimens only), is one component of a comprehensive MRSA colonization surveillance program. It is not intended to diagnose MRSA infection nor to guide or monitor treatment for MRSA infections. Performed at [facility], [place] [address] [place], [place] [address]          Radiology Studies: No results found.      Scheduled Meds: . Chlorhexidine Gluconate Cloth  6 each Topical Daily  . Chlorhexidine Gluconate Cloth  6 each Topical Q0600  . famotidine  20 mg Oral Daily  . levothyroxine  150 mcg Oral QAC breakfast  . polyethylene glycol  17 g Oral Once   Continuous Infusions: . sodium chloride 10 mL/hr at 08/09/17  0307  . piperacillin-tazobactam (ZOSYN)  IV 3.375 g (08/09/17 0324)     LOS: 8 days     Georgette Shell, MD [facility]  If 7PM-7AM, please contact night-coverage [email or web address] Password [facility] 08/09/2017, 11:27 AM

## 2017-08-11 DIAGNOSIS — N2581 Secondary hyperparathyroidism of renal origin: Secondary | ICD-10-CM | POA: Diagnosis not present

## 2017-08-11 DIAGNOSIS — Z992 Dependence on renal dialysis: Secondary | ICD-10-CM | POA: Diagnosis not present

## 2017-08-11 DIAGNOSIS — D631 Anemia in chronic kidney disease: Secondary | ICD-10-CM | POA: Diagnosis not present

## 2017-08-11 DIAGNOSIS — N186 End stage renal disease: Secondary | ICD-10-CM | POA: Diagnosis not present

## 2017-08-11 DIAGNOSIS — T82598A Other mechanical complication of other cardiac and vascular devices and implants, initial encounter: Secondary | ICD-10-CM | POA: Diagnosis not present

## 2017-08-11 DIAGNOSIS — E1029 Type 1 diabetes mellitus with other diabetic kidney complication: Secondary | ICD-10-CM | POA: Diagnosis not present

## 2017-08-11 DIAGNOSIS — D689 Coagulation defect, unspecified: Secondary | ICD-10-CM | POA: Diagnosis not present

## 2017-08-13 DIAGNOSIS — Z992 Dependence on renal dialysis: Secondary | ICD-10-CM | POA: Diagnosis not present

## 2017-08-13 DIAGNOSIS — N2581 Secondary hyperparathyroidism of renal origin: Secondary | ICD-10-CM | POA: Diagnosis not present

## 2017-08-13 DIAGNOSIS — E1029 Type 1 diabetes mellitus with other diabetic kidney complication: Secondary | ICD-10-CM | POA: Diagnosis not present

## 2017-08-13 DIAGNOSIS — C9000 Multiple myeloma not having achieved remission: Secondary | ICD-10-CM | POA: Diagnosis not present

## 2017-08-13 DIAGNOSIS — N186 End stage renal disease: Secondary | ICD-10-CM | POA: Diagnosis not present

## 2017-08-13 DIAGNOSIS — T82598A Other mechanical complication of other cardiac and vascular devices and implants, initial encounter: Secondary | ICD-10-CM | POA: Diagnosis not present

## 2017-08-13 DIAGNOSIS — D689 Coagulation defect, unspecified: Secondary | ICD-10-CM | POA: Diagnosis not present

## 2017-08-13 DIAGNOSIS — D631 Anemia in chronic kidney disease: Secondary | ICD-10-CM | POA: Diagnosis not present

## 2017-08-14 DIAGNOSIS — L98499 Non-pressure chronic ulcer of skin of other sites with unspecified severity: Secondary | ICD-10-CM | POA: Diagnosis not present

## 2017-08-14 DIAGNOSIS — G546 Phantom limb syndrome with pain: Secondary | ICD-10-CM | POA: Diagnosis not present

## 2017-08-14 DIAGNOSIS — C189 Malignant neoplasm of colon, unspecified: Secondary | ICD-10-CM | POA: Diagnosis not present

## 2017-08-14 DIAGNOSIS — I739 Peripheral vascular disease, unspecified: Secondary | ICD-10-CM | POA: Diagnosis not present

## 2017-08-15 DIAGNOSIS — C189 Malignant neoplasm of colon, unspecified: Secondary | ICD-10-CM | POA: Diagnosis not present

## 2017-08-15 DIAGNOSIS — E1029 Type 1 diabetes mellitus with other diabetic kidney complication: Secondary | ICD-10-CM | POA: Diagnosis not present

## 2017-08-15 DIAGNOSIS — E1151 Type 2 diabetes mellitus with diabetic peripheral angiopathy without gangrene: Secondary | ICD-10-CM | POA: Diagnosis not present

## 2017-08-15 DIAGNOSIS — T82598A Other mechanical complication of other cardiac and vascular devices and implants, initial encounter: Secondary | ICD-10-CM | POA: Diagnosis not present

## 2017-08-15 DIAGNOSIS — N2581 Secondary hyperparathyroidism of renal origin: Secondary | ICD-10-CM | POA: Diagnosis not present

## 2017-08-15 DIAGNOSIS — R509 Fever, unspecified: Secondary | ICD-10-CM | POA: Diagnosis not present

## 2017-08-15 DIAGNOSIS — N186 End stage renal disease: Secondary | ICD-10-CM | POA: Diagnosis not present

## 2017-08-15 DIAGNOSIS — I10 Essential (primary) hypertension: Secondary | ICD-10-CM | POA: Diagnosis not present

## 2017-08-15 DIAGNOSIS — D689 Coagulation defect, unspecified: Secondary | ICD-10-CM | POA: Diagnosis not present

## 2017-08-15 DIAGNOSIS — D631 Anemia in chronic kidney disease: Secondary | ICD-10-CM | POA: Diagnosis not present

## 2017-08-15 DIAGNOSIS — Z992 Dependence on renal dialysis: Secondary | ICD-10-CM | POA: Diagnosis not present

## 2017-08-18 DIAGNOSIS — R509 Fever, unspecified: Secondary | ICD-10-CM | POA: Diagnosis not present

## 2017-08-18 DIAGNOSIS — T82598A Other mechanical complication of other cardiac and vascular devices and implants, initial encounter: Secondary | ICD-10-CM | POA: Diagnosis not present

## 2017-08-18 DIAGNOSIS — E1029 Type 1 diabetes mellitus with other diabetic kidney complication: Secondary | ICD-10-CM | POA: Diagnosis not present

## 2017-08-18 DIAGNOSIS — D631 Anemia in chronic kidney disease: Secondary | ICD-10-CM | POA: Diagnosis not present

## 2017-08-18 DIAGNOSIS — N2581 Secondary hyperparathyroidism of renal origin: Secondary | ICD-10-CM | POA: Diagnosis not present

## 2017-08-18 DIAGNOSIS — D689 Coagulation defect, unspecified: Secondary | ICD-10-CM | POA: Diagnosis not present

## 2017-08-18 DIAGNOSIS — N186 End stage renal disease: Secondary | ICD-10-CM | POA: Diagnosis not present

## 2017-08-18 DIAGNOSIS — Z992 Dependence on renal dialysis: Secondary | ICD-10-CM | POA: Diagnosis not present

## 2017-08-20 ENCOUNTER — Non-Acute Institutional Stay: Payer: Medicare Other | Admitting: Hospice and Palliative Medicine

## 2017-08-20 ENCOUNTER — Non-Acute Institutional Stay: Payer: Medicare Other | Admitting: Nurse Practitioner

## 2017-08-20 ENCOUNTER — Encounter: Payer: Self-pay | Admitting: Nurse Practitioner

## 2017-08-20 DIAGNOSIS — T82598A Other mechanical complication of other cardiac and vascular devices and implants, initial encounter: Secondary | ICD-10-CM | POA: Diagnosis not present

## 2017-08-20 DIAGNOSIS — C799 Secondary malignant neoplasm of unspecified site: Secondary | ICD-10-CM

## 2017-08-20 DIAGNOSIS — R269 Unspecified abnormalities of gait and mobility: Secondary | ICD-10-CM | POA: Diagnosis not present

## 2017-08-20 DIAGNOSIS — L89159 Pressure ulcer of sacral region, unspecified stage: Secondary | ICD-10-CM

## 2017-08-20 DIAGNOSIS — R509 Fever, unspecified: Secondary | ICD-10-CM | POA: Diagnosis not present

## 2017-08-20 DIAGNOSIS — G893 Neoplasm related pain (acute) (chronic): Secondary | ICD-10-CM | POA: Diagnosis not present

## 2017-08-20 DIAGNOSIS — Z515 Encounter for palliative care: Secondary | ICD-10-CM

## 2017-08-20 DIAGNOSIS — M5136 Other intervertebral disc degeneration, lumbar region: Secondary | ICD-10-CM | POA: Diagnosis not present

## 2017-08-20 DIAGNOSIS — R630 Anorexia: Secondary | ICD-10-CM

## 2017-08-20 DIAGNOSIS — R634 Abnormal weight loss: Secondary | ICD-10-CM

## 2017-08-20 DIAGNOSIS — Z85038 Personal history of other malignant neoplasm of large intestine: Secondary | ICD-10-CM

## 2017-08-20 DIAGNOSIS — M6281 Muscle weakness (generalized): Secondary | ICD-10-CM | POA: Diagnosis not present

## 2017-08-20 DIAGNOSIS — C189 Malignant neoplasm of colon, unspecified: Secondary | ICD-10-CM

## 2017-08-20 DIAGNOSIS — N186 End stage renal disease: Secondary | ICD-10-CM | POA: Diagnosis not present

## 2017-08-20 DIAGNOSIS — D631 Anemia in chronic kidney disease: Secondary | ICD-10-CM | POA: Diagnosis not present

## 2017-08-20 DIAGNOSIS — G8929 Other chronic pain: Secondary | ICD-10-CM | POA: Diagnosis not present

## 2017-08-20 DIAGNOSIS — Z992 Dependence on renal dialysis: Secondary | ICD-10-CM | POA: Diagnosis not present

## 2017-08-20 DIAGNOSIS — D689 Coagulation defect, unspecified: Secondary | ICD-10-CM | POA: Diagnosis not present

## 2017-08-20 DIAGNOSIS — E1029 Type 1 diabetes mellitus with other diabetic kidney complication: Secondary | ICD-10-CM | POA: Diagnosis not present

## 2017-08-20 DIAGNOSIS — N2581 Secondary hyperparathyroidism of renal origin: Secondary | ICD-10-CM | POA: Diagnosis not present

## 2017-08-20 NOTE — Progress Notes (Signed)
PALLIATIVE CARE CONSULT VISIT   PATIENT NAME: Jack [name] DOB: 01-May-1945 MRN: [id number]  PRIMARY CARE PROVIDER:   Martinique, Jack G, MD  REFERRING PROVIDER:  Martinique, Jack G, MD [address] [place], [place] [address]  RESPONSIBLE PARTY:   Jack [name] [phone] (cell)     ASSESSMENT and   RECOMMENDATIONS  Colon adenocarcinoma with metatasis to liver, lung and omentum (incurable)  -recent GIB 2/2 CA (ischemia) -Evaluated by Oncology and GI; patient is not candidate for surgery or oral palliative chemotherapy -patient is aware of poor prognosis of three months or less -patient desires to continue iHD at this point; talked with patient about possible transition to hospice if he decides different -desires pain control; thinks current medication should be more often. -consider increasing frequency of oxycodone to Q4hr   Chronic medical problems HLD ESRD on iHD  Anemia in CKD DM PVD hypothyroidism -patient want to continue dialysis at this time -followed by facility medical provider and nephrology -continue current medications as prescribed  -Anorexia -sacral decubitus (stage unknown) -patient with poor appetite -continue renal diet and supplements (nephro) -continue current wound care per wound care MD  ACP -DNR/DNI -wants to continue iHD at this time    I spent 60 minutes providing this consultation,  from 1:30pm to 2:30pm More than 50% of the time in this consultation was spent coordinating communication.   HISTORY OF PRESENT ILLNESS:  Jack [name] is a 72 y.o. year old male with multiple medical problems including stage IV colon CA with metastasis to liver, lung and omentum with no treatment options; recent gangrene of left ring finger with sepsis requiring amputations of fingers and s/p bilateral BKA, previous ESRD on iHD, DM, PVD.Palliative Care was asked to follow for symptom management, coordination of care, address goals of care and  ongoing patient and family education and support  CODE STATUS: DNR/DNI  PPS: 40% HOSPICE ELIGIBILITY/DIAGNOSIS: TBD (patient is hospice eligible but wants to continue iHD); talked with patient at length about possible transition to hospice if he decides he doesn't want dialysis anymonre  PAST MEDICAL HISTORY:  Past Medical History:  Diagnosis Date  . Anemia   . Arthritis   . Asthma   . Contracture of joint    left knee  . Decubitus ulcer of sacral region, stage 2 03/07/2014  . Diabetes mellitus with peripheral vascular disease ([place])   . End-stage renal disease on hemodialysis [facility])    Started HD March 2014.  Cause of ESRD was DM.  Gets HD at [facility] on [address] on MWF schedule.   Marland Kitchen ESRD on hemodialysis ([place])   . Gangrene ([place])    right BKA  . Gangrene ([place])    right index finger  . Gangrene of foot ([place])   . Glaucoma   . Glaucoma 03/07/2014  . Heart murmur   . History of MRSA infection 04/22/2013   Bacteremia assoc w L foot wound infection Mar 2015   . Hyperparathyroidism, secondary renal ([place])   . Hypertension   . Hypothyroidism   . MRSA bacteremia   . Multiple myeloma, without mention of having achieved remission 03/30/2012   Cytogenetic neg on 03/23/2012. Pt states he DOES NOT have cancer  . Necrosis ([place])    left long finger  . Peripheral arterial disease ([place])   . Peripheral vascular disease, unspecified ([place]) 11/19/2012   In the past had R foot toe amps then R TMA. In 2015 had left foot toe amp > then  TMA >then L BKA on 05/14/13   . Pneumonia    2012  . PONV (postoperative nausea and vomiting)   . Thyroid disease    hyperparathyroidism    SOCIAL HX:  Social History   Tobacco Use  . Smoking status: Former Smoker    Types: Cigarettes    Last attempt to quit: 03/19/1982    Years since quitting: 35.4  . Smokeless tobacco: Never Used  Substance Use Topics  . Alcohol use: No    Alcohol/week: 0.0 oz    Comment: rare    ALLERGIES:  Allergies  Allergen Reactions    . Bee Venom Anaphylaxis  . Ivp Dye [Iodinated Diagnostic Agents] Hives  . Lisinopril Cough  . Morphine And Related Hives and Other (See Comments)    Bradycardia      PERTINENT MEDICATIONS:  Outpatient Encounter Medications as of 08/20/2017  Medication Sig  . amoxicillin-clavulanate (AUGMENTIN) 500-125 MG tablet Take 1 tablet (500 mg total) by mouth at bedtime.  Marland Kitchen aspirin EC 81 MG tablet Take 81 mg by mouth daily.  Marland Kitchen atorvastatin (LIPITOR) 10 MG tablet TAKE 1 TABLET (10 MG TOTAL) BY MOUTH DAILY.  Marland Kitchen Continuous Blood Gluc Sensor (FREESTYLE LIBRE 14 DAY SENSOR) MISC USE DAILY*NOT COVERED*  . Continuous Blood Gluc Sensor (FREESTYLE LIBRE 14 DAY SENSOR) MISC USE DAILY*NOT COVERED*  . famotidine (PEPCID) 20 MG tablet Take 1 tablet (20 mg total) by mouth daily.  Marland Kitchen lanthanum (FOSRENOL) 1000 MG chewable tablet Chew 1,000 mg by mouth daily as needed.   Marland Kitchen levothyroxine (SYNTHROID, LEVOTHROID) 150 MCG tablet TAKE 1/2 TABLET BY MOUTH ON MONDAYS AND 1 TABLET BY MOUTH THE REST OF THE WEEK.  . multivitamin (RENA-VIT) TABS tablet Take 1 tablet by mouth 4 (four) times a week.   . ondansetron (ZOFRAN) 4 MG tablet Take 1 tablet (4 mg total) by mouth every 8 (eight) hours as needed for nausea or vomiting.  Marland Kitchen oxyCODONE (ROXICODONE) 5 MG immediate release tablet 1-2 p.o. every 6 hours as needed pain   No facility-administered encounter medications on file as of 08/20/2017.     PHYSICAL EXAM:   General: NAD, frail appearing, thin Cardiovascular: regular rate and rhythm Pulmonary: clear ant fields Abdomen: soft, nontender, + bowel sounds GU: no suprapubic tenderness Extremities: no edema, s/p multiple finger amputations most recently left ring finger; s/p bilateral BKA's Skin: decubitus on sacrum (no visualize) followed by wound care MD Neurological: Weakness but otherwise nonfocal  Jack Mangino G Martinique, NP

## 2017-08-21 DIAGNOSIS — L98499 Non-pressure chronic ulcer of skin of other sites with unspecified severity: Secondary | ICD-10-CM | POA: Diagnosis not present

## 2017-08-21 NOTE — Progress Notes (Signed)
Duplicate visit also made by Stephanie Martinique, NP with Palliative Care. No charge.

## 2017-08-22 DIAGNOSIS — T82598A Other mechanical complication of other cardiac and vascular devices and implants, initial encounter: Secondary | ICD-10-CM | POA: Diagnosis not present

## 2017-08-22 DIAGNOSIS — Z992 Dependence on renal dialysis: Secondary | ICD-10-CM | POA: Diagnosis not present

## 2017-08-22 DIAGNOSIS — N2581 Secondary hyperparathyroidism of renal origin: Secondary | ICD-10-CM | POA: Diagnosis not present

## 2017-08-22 DIAGNOSIS — D631 Anemia in chronic kidney disease: Secondary | ICD-10-CM | POA: Diagnosis not present

## 2017-08-22 DIAGNOSIS — E1029 Type 1 diabetes mellitus with other diabetic kidney complication: Secondary | ICD-10-CM | POA: Diagnosis not present

## 2017-08-22 DIAGNOSIS — N186 End stage renal disease: Secondary | ICD-10-CM | POA: Diagnosis not present

## 2017-08-22 DIAGNOSIS — R509 Fever, unspecified: Secondary | ICD-10-CM | POA: Diagnosis not present

## 2017-08-22 DIAGNOSIS — D689 Coagulation defect, unspecified: Secondary | ICD-10-CM | POA: Diagnosis not present

## 2017-08-25 DIAGNOSIS — N186 End stage renal disease: Secondary | ICD-10-CM | POA: Diagnosis not present

## 2017-08-25 DIAGNOSIS — E1029 Type 1 diabetes mellitus with other diabetic kidney complication: Secondary | ICD-10-CM | POA: Diagnosis not present

## 2017-08-25 DIAGNOSIS — Z992 Dependence on renal dialysis: Secondary | ICD-10-CM | POA: Diagnosis not present

## 2017-08-25 DIAGNOSIS — D631 Anemia in chronic kidney disease: Secondary | ICD-10-CM | POA: Diagnosis not present

## 2017-08-25 DIAGNOSIS — T82598A Other mechanical complication of other cardiac and vascular devices and implants, initial encounter: Secondary | ICD-10-CM | POA: Diagnosis not present

## 2017-08-25 DIAGNOSIS — D689 Coagulation defect, unspecified: Secondary | ICD-10-CM | POA: Diagnosis not present

## 2017-08-25 DIAGNOSIS — R509 Fever, unspecified: Secondary | ICD-10-CM | POA: Diagnosis not present

## 2017-08-25 DIAGNOSIS — N2581 Secondary hyperparathyroidism of renal origin: Secondary | ICD-10-CM | POA: Diagnosis not present

## 2017-08-26 ENCOUNTER — Non-Acute Institutional Stay: Payer: Medicare Other | Admitting: Hospice and Palliative Medicine

## 2017-08-26 DIAGNOSIS — Z515 Encounter for palliative care: Secondary | ICD-10-CM

## 2017-08-27 DIAGNOSIS — R509 Fever, unspecified: Secondary | ICD-10-CM | POA: Diagnosis not present

## 2017-08-27 DIAGNOSIS — E1029 Type 1 diabetes mellitus with other diabetic kidney complication: Secondary | ICD-10-CM | POA: Diagnosis not present

## 2017-08-27 DIAGNOSIS — D689 Coagulation defect, unspecified: Secondary | ICD-10-CM | POA: Diagnosis not present

## 2017-08-27 DIAGNOSIS — T82598A Other mechanical complication of other cardiac and vascular devices and implants, initial encounter: Secondary | ICD-10-CM | POA: Diagnosis not present

## 2017-08-27 DIAGNOSIS — N2581 Secondary hyperparathyroidism of renal origin: Secondary | ICD-10-CM | POA: Diagnosis not present

## 2017-08-27 DIAGNOSIS — N186 End stage renal disease: Secondary | ICD-10-CM | POA: Diagnosis not present

## 2017-08-27 DIAGNOSIS — Z992 Dependence on renal dialysis: Secondary | ICD-10-CM | POA: Diagnosis not present

## 2017-08-27 DIAGNOSIS — D631 Anemia in chronic kidney disease: Secondary | ICD-10-CM | POA: Diagnosis not present

## 2017-08-27 NOTE — Progress Notes (Signed)
PALLIATIVE CARE CONSULT VISIT   PATIENT NAME: Jack [name] DOB: 1945/03/01 MRN: [id number]  PRIMARY CARE PROVIDER:   Martinique, Betty G, MD  REFERRING PROVIDER:  Martinique, Betty G, MD [place], [place] [address]  RESPONSIBLE PARTY:   Self  ASSESSMENT:      Patient feels improved. He has no acute symptoms. He feels like rehab is going well and is working towards plan to return to his daughter's house.   Recommend hospice at home.   RECOMMENDATIONS and PLAN:  1. Continue supportive care 2. Recommend hospice at home after discharge from rehab  I spent 10 minutes providing this consultation,  from 1220 to 1230. More than 50% of the time in this consultation was spent coordinating communication.   HISTORY OF PRESENT ILLNESS:  Jack [name] is a 72 y.o. year old male with multiple medical problems including stage IV colon CA with metastasis to liver, lung and omentum with no treatment options; recent gangrene of left ring finger with sepsis requiring amputations of fingers and s/p bilateral BKA, previous ESRD on HD, DM, PVD. Palliative Care was asked to follow for symptom management, coordination of care, address goals of care and ongoing patient and family education and support. Routine follow up visit made today.   CODE STATUS: DNR  PPS: 30% HOSPICE ELIGIBILITY/DIAGNOSIS: TBD  PAST MEDICAL HISTORY:  Past Medical History:  Diagnosis Date  . Anemia   . Arthritis   . Asthma   . Contracture of joint    left knee  . Decubitus ulcer of sacral region, stage 2 03/07/2014  . Diabetes mellitus with peripheral vascular disease ([place])   . End-stage renal disease on hemodialysis [facility])    Started HD March 2014.  Cause of ESRD was DM.  Gets HD at [facility] on [address] on MWF schedule.   Marland Kitchen ESRD on hemodialysis ([place])   . Gangrene ([place])    right BKA  . Gangrene ([place])    right index finger  . Gangrene of foot ([place])   . Glaucoma   . Glaucoma 03/07/2014  .  Heart murmur   . History of MRSA infection 04/22/2013   Bacteremia assoc w L foot wound infection Mar 2015   . Hyperparathyroidism, secondary renal ([place])   . Hypertension   . Hypothyroidism   . MRSA bacteremia   . Multiple myeloma, without mention of having achieved remission 03/30/2012   Cytogenetic neg on 03/23/2012. Pt states he DOES NOT have cancer  . Necrosis ([place])    left long finger  . Peripheral arterial disease ([place])   . Peripheral vascular disease, unspecified ([place]) 11/19/2012   In the past had R foot toe amps then R TMA. In 2015 had left foot toe amp > then TMA >then L BKA on 05/14/13   . Pneumonia    2012  . PONV (postoperative nausea and vomiting)   . Thyroid disease    hyperparathyroidism    SOCIAL HX:  Social History   Tobacco Use  . Smoking status: Former Smoker    Types: Cigarettes    Last attempt to quit: 03/19/1982    Years since quitting: 35.4  . Smokeless tobacco: Never Used  Substance Use Topics  . Alcohol use: No    Alcohol/week: 0.0 standard drinks    Comment: rare    ALLERGIES:  Allergies  Allergen Reactions  . Bee Venom Anaphylaxis  . Ivp Dye [Iodinated Diagnostic Agents] Hives  . Lisinopril Cough  . Morphine And  Related Hives and Other (See Comments)    Bradycardia      PERTINENT MEDICATIONS:  Outpatient Encounter Medications as of 08/26/2017  Medication Sig  . amoxicillin-clavulanate (AUGMENTIN) 500-125 MG tablet Take 1 tablet (500 mg total) by mouth at bedtime.  Marland Kitchen aspirin EC 81 MG tablet Take 81 mg by mouth daily.  Marland Kitchen atorvastatin (LIPITOR) 10 MG tablet TAKE 1 TABLET (10 MG TOTAL) BY MOUTH DAILY.  Marland Kitchen Continuous Blood Gluc Sensor (FREESTYLE LIBRE 14 DAY SENSOR) MISC USE DAILY*NOT COVERED*  . Continuous Blood Gluc Sensor (FREESTYLE LIBRE 14 DAY SENSOR) MISC USE DAILY*NOT COVERED*  . famotidine (PEPCID) 20 MG tablet Take 1 tablet (20 mg total) by mouth daily.  Marland Kitchen lanthanum (FOSRENOL) 1000 MG chewable tablet Chew 1,000 mg by mouth daily as needed.   Marland Kitchen  levothyroxine (SYNTHROID, LEVOTHROID) 150 MCG tablet TAKE 1/2 TABLET BY MOUTH ON MONDAYS AND 1 TABLET BY MOUTH THE REST OF THE WEEK.  . multivitamin (RENA-VIT) TABS tablet Take 1 tablet by mouth 4 (four) times a week.   . ondansetron (ZOFRAN) 4 MG tablet Take 1 tablet (4 mg total) by mouth every 8 (eight) hours as needed for nausea or vomiting.  Marland Kitchen oxyCODONE (ROXICODONE) 5 MG immediate release tablet 1-2 p.o. every 6 hours as needed pain   No facility-administered encounter medications on file as of 08/26/2017.     PHYSICAL EXAM:   General: NAD, frail appearing, thin, in bed Cardiovascular: irregular Pulmonary: clear ant fields Abdomen: soft, nontender, + bowel sounds GU: no suprapubic tenderness Extremities: B. BKA Skin: wound reported but not visualized Neurological: Weakness but otherwise nonfocal  Irean Hong, NP

## 2017-08-29 DIAGNOSIS — N2581 Secondary hyperparathyroidism of renal origin: Secondary | ICD-10-CM | POA: Diagnosis not present

## 2017-08-29 DIAGNOSIS — Z992 Dependence on renal dialysis: Secondary | ICD-10-CM | POA: Diagnosis not present

## 2017-08-29 DIAGNOSIS — T82598A Other mechanical complication of other cardiac and vascular devices and implants, initial encounter: Secondary | ICD-10-CM | POA: Diagnosis not present

## 2017-08-29 DIAGNOSIS — N186 End stage renal disease: Secondary | ICD-10-CM | POA: Diagnosis not present

## 2017-08-29 DIAGNOSIS — D689 Coagulation defect, unspecified: Secondary | ICD-10-CM | POA: Diagnosis not present

## 2017-08-29 DIAGNOSIS — D631 Anemia in chronic kidney disease: Secondary | ICD-10-CM | POA: Diagnosis not present

## 2017-08-29 DIAGNOSIS — E1029 Type 1 diabetes mellitus with other diabetic kidney complication: Secondary | ICD-10-CM | POA: Diagnosis not present

## 2017-08-29 DIAGNOSIS — R509 Fever, unspecified: Secondary | ICD-10-CM | POA: Diagnosis not present

## 2017-09-03 ENCOUNTER — Non-Acute Institutional Stay: Payer: Medicare Other | Admitting: Hospice and Palliative Medicine

## 2017-09-03 DIAGNOSIS — Z992 Dependence on renal dialysis: Secondary | ICD-10-CM | POA: Diagnosis not present

## 2017-09-03 DIAGNOSIS — E1029 Type 1 diabetes mellitus with other diabetic kidney complication: Secondary | ICD-10-CM | POA: Diagnosis not present

## 2017-09-03 DIAGNOSIS — N2581 Secondary hyperparathyroidism of renal origin: Secondary | ICD-10-CM | POA: Diagnosis not present

## 2017-09-03 DIAGNOSIS — T82598A Other mechanical complication of other cardiac and vascular devices and implants, initial encounter: Secondary | ICD-10-CM | POA: Diagnosis not present

## 2017-09-03 DIAGNOSIS — D689 Coagulation defect, unspecified: Secondary | ICD-10-CM | POA: Diagnosis not present

## 2017-09-03 DIAGNOSIS — Z515 Encounter for palliative care: Secondary | ICD-10-CM

## 2017-09-03 DIAGNOSIS — R509 Fever, unspecified: Secondary | ICD-10-CM | POA: Diagnosis not present

## 2017-09-03 DIAGNOSIS — N186 End stage renal disease: Secondary | ICD-10-CM | POA: Diagnosis not present

## 2017-09-03 DIAGNOSIS — D631 Anemia in chronic kidney disease: Secondary | ICD-10-CM | POA: Diagnosis not present

## 2017-09-03 NOTE — Progress Notes (Signed)
PALLIATIVE CARE CONSULT VISIT   PATIENT NAME: Jack [name] DOB: [birth year] MRN: [id number]  PRIMARY CARE PROVIDER:   [place]ique, Betty G, MD  REFERRING PROVIDER:  [place], Betty G, MD [name] [id number]  RESPONSIBLE PARTY:   Self  ASSESSMENT:     Patient is fatigued today. He had dialysis earlier and attributes feeling tired to that. Otherwise, he has no acute complaints. He says he feels like rehab is not progressing as well as he would like and we discussed limitations associated with his multiple medical problems. He still intends to discharge home eventually to live with his daughter.   I would still recommend hospice, although ongoing dialysis might make delay hospice involvement. He can also be followed at home by palliative care.   RECOMMENDATIONS and PLAN:  1. Continue supportive care  I spent 10 minutes providing this consultation,  from 1310 to 1320. More than 50% of the time in this consultation was spent coordinating communication.   HISTORY OF PRESENT ILLNESS:  Jack [name] is a 72 y.o. year old male with multiple medical problems including stage IV colon CA with metastasis to liver, lung and omentum with no treatment options; recent gangrene of left ring finger with sepsis requiring amputations of fingers and s/p bilateral BKA, ESRD on HD, DM, PVD. Palliative Care was asked to follow for symptom management, coordination of care, address goals of care and ongoing patient and family education and support. Routine follow up visit made today.   CODE STATUS: DNR  PPS: 30% HOSPICE ELIGIBILITY/DIAGNOSIS: TBD  PAST MEDICAL HISTORY:  Past Medical History:  Diagnosis Date  . Anemia   . Arthritis   . Asthma   . Contracture of joint    left knee  . Decubitus ulcer of sacral region, stage 2 03/07/2014  . Diabetes mellitus with peripheral vascular disease ([place])   . End-stage renal disease on hemodialysis [facility])    Started HD March  2014.  Cause of ESRD was DM.  Gets HD at [facility] on [address] on MWF schedule.   Marland Kitchen ESRD on hemodialysis ([place])   . Gangrene ([place])    right BKA  . Gangrene ([place])    right index finger  . Gangrene of foot ([place])   . Glaucoma   . Glaucoma 03/07/2014  . Heart murmur   . History of MRSA infection 04/22/2013   Bacteremia assoc w L foot wound infection Mar 2015   . Hyperparathyroidism, secondary renal ([place])   . Hypertension   . Hypothyroidism   . MRSA bacteremia   . Multiple myeloma, without mention of having achieved remission 03/30/2012   Cytogenetic neg on 03/23/2012. Pt states he DOES NOT have cancer  . Necrosis ([place])    left long finger  . Peripheral arterial disease ([place])   . Peripheral vascular disease, unspecified ([place]) 11/19/2012   In the past had R foot toe amps then R TMA. In 2015 had left foot toe amp > then TMA >then L BKA on 05/14/13   . Pneumonia    2012  . PONV (postoperative nausea and vomiting)   . Thyroid disease    hyperparathyroidism    SOCIAL HX:  Social History   Tobacco Use  . Smoking status: Former Smoker    Types: Cigarettes    Last attempt to quit: 03/19/1982    Years since quitting: 35.4  . Smokeless tobacco: Never Used  Substance Use Topics  . Alcohol use: No  Alcohol/week: 0.0 standard drinks    Comment: rare    ALLERGIES:  Allergies  Allergen Reactions  . Bee Venom Anaphylaxis  . Ivp Dye [Iodinated Diagnostic Agents] Hives  . Lisinopril Cough  . Morphine And Related Hives and Other (See Comments)    Bradycardia      PERTINENT MEDICATIONS:  Outpatient Encounter Medications as of 09/03/2017  Medication Sig  . amoxicillin-clavulanate (AUGMENTIN) 500-125 MG tablet Take 1 tablet (500 mg total) by mouth at bedtime.  Marland Kitchen aspirin EC 81 MG tablet Take 81 mg by mouth daily.  Marland Kitchen atorvastatin (LIPITOR) 10 MG tablet TAKE 1 TABLET (10 MG TOTAL) BY MOUTH DAILY.  Marland Kitchen Continuous Blood Gluc Sensor (FREESTYLE LIBRE 14 DAY SENSOR) MISC USE DAILY*NOT COVERED*  .  Continuous Blood Gluc Sensor (FREESTYLE LIBRE 14 DAY SENSOR) MISC USE DAILY*NOT COVERED*  . famotidine (PEPCID) 20 MG tablet Take 1 tablet (20 mg total) by mouth daily.  Marland Kitchen lanthanum (FOSRENOL) 1000 MG chewable tablet Chew 1,000 mg by mouth daily as needed.   Marland Kitchen levothyroxine (SYNTHROID, LEVOTHROID) 150 MCG tablet TAKE 1/2 TABLET BY MOUTH ON MONDAYS AND 1 TABLET BY MOUTH THE REST OF THE WEEK.  . multivitamin (RENA-VIT) TABS tablet Take 1 tablet by mouth 4 (four) times a week.   . ondansetron (ZOFRAN) 4 MG tablet Take 1 tablet (4 mg total) by mouth every 8 (eight) hours as needed for nausea or vomiting.  Marland Kitchen oxyCODONE (ROXICODONE) 5 MG immediate release tablet 1-2 p.o. every 6 hours as needed pain   No facility-administered encounter medications on file as of 09/03/2017.     PHYSICAL EXAM:   General: NAD, frail appearing, thin, in bed Cardiovascular: irregular Pulmonary: clear ant fields Abdomen: soft, nontender, + bowel sounds Extremities: B. BKA Skin: wound reported but not visualized Neurological: Weakness, less conversational today, but otherwise nonfocal  Irean Hong, NP

## 2017-09-04 DIAGNOSIS — L98499 Non-pressure chronic ulcer of skin of other sites with unspecified severity: Secondary | ICD-10-CM | POA: Diagnosis not present

## 2017-09-05 DIAGNOSIS — E1029 Type 1 diabetes mellitus with other diabetic kidney complication: Secondary | ICD-10-CM | POA: Diagnosis not present

## 2017-09-05 DIAGNOSIS — D689 Coagulation defect, unspecified: Secondary | ICD-10-CM | POA: Diagnosis not present

## 2017-09-05 DIAGNOSIS — R509 Fever, unspecified: Secondary | ICD-10-CM | POA: Diagnosis not present

## 2017-09-05 DIAGNOSIS — Z992 Dependence on renal dialysis: Secondary | ICD-10-CM | POA: Diagnosis not present

## 2017-09-05 DIAGNOSIS — N186 End stage renal disease: Secondary | ICD-10-CM | POA: Diagnosis not present

## 2017-09-05 DIAGNOSIS — N2581 Secondary hyperparathyroidism of renal origin: Secondary | ICD-10-CM | POA: Diagnosis not present

## 2017-09-05 DIAGNOSIS — T82598A Other mechanical complication of other cardiac and vascular devices and implants, initial encounter: Secondary | ICD-10-CM | POA: Diagnosis not present

## 2017-09-05 DIAGNOSIS — D631 Anemia in chronic kidney disease: Secondary | ICD-10-CM | POA: Diagnosis not present

## 2017-09-08 DIAGNOSIS — D689 Coagulation defect, unspecified: Secondary | ICD-10-CM | POA: Diagnosis not present

## 2017-09-08 DIAGNOSIS — C7800 Secondary malignant neoplasm of unspecified lung: Secondary | ICD-10-CM | POA: Diagnosis not present

## 2017-09-08 DIAGNOSIS — E1029 Type 1 diabetes mellitus with other diabetic kidney complication: Secondary | ICD-10-CM | POA: Diagnosis not present

## 2017-09-08 DIAGNOSIS — R112 Nausea with vomiting, unspecified: Secondary | ICD-10-CM | POA: Diagnosis not present

## 2017-09-08 DIAGNOSIS — R509 Fever, unspecified: Secondary | ICD-10-CM | POA: Diagnosis not present

## 2017-09-08 DIAGNOSIS — L89890 Pressure ulcer of other site, unstageable: Secondary | ICD-10-CM | POA: Diagnosis not present

## 2017-09-08 DIAGNOSIS — D631 Anemia in chronic kidney disease: Secondary | ICD-10-CM | POA: Diagnosis not present

## 2017-09-08 DIAGNOSIS — C189 Malignant neoplasm of colon, unspecified: Secondary | ICD-10-CM | POA: Diagnosis not present

## 2017-09-08 DIAGNOSIS — Z992 Dependence on renal dialysis: Secondary | ICD-10-CM | POA: Diagnosis not present

## 2017-09-08 DIAGNOSIS — T82598A Other mechanical complication of other cardiac and vascular devices and implants, initial encounter: Secondary | ICD-10-CM | POA: Diagnosis not present

## 2017-09-08 DIAGNOSIS — N186 End stage renal disease: Secondary | ICD-10-CM | POA: Diagnosis not present

## 2017-09-08 DIAGNOSIS — N2581 Secondary hyperparathyroidism of renal origin: Secondary | ICD-10-CM | POA: Diagnosis not present

## 2017-09-09 ENCOUNTER — Non-Acute Institutional Stay: Payer: Self-pay | Admitting: Hospice and Palliative Medicine

## 2017-09-09 DIAGNOSIS — K5900 Constipation, unspecified: Secondary | ICD-10-CM | POA: Diagnosis not present

## 2017-09-09 DIAGNOSIS — R5381 Other malaise: Secondary | ICD-10-CM | POA: Diagnosis not present

## 2017-09-09 DIAGNOSIS — C189 Malignant neoplasm of colon, unspecified: Secondary | ICD-10-CM | POA: Diagnosis not present

## 2017-09-09 DIAGNOSIS — R112 Nausea with vomiting, unspecified: Secondary | ICD-10-CM | POA: Diagnosis not present

## 2017-09-09 DIAGNOSIS — N186 End stage renal disease: Secondary | ICD-10-CM | POA: Diagnosis not present

## 2017-09-10 NOTE — Progress Notes (Signed)
PALLIATIVE CARE CONSULT VISIT   PATIENT NAME: Jack [name] DOB: [birth year] MRN: [id number]  PRIMARY CARE PROVIDER:   Martinique, Betty G, MD  REFERRING PROVIDER:  Martinique, Betty G, MD [address] [place], [place] [address]  RESPONSIBLE PARTY:   Self  ASSESSMENT:     I met today with patient's daughter. She agrees that patient has been declining over the past two weeks. He has increased periods of confusion and pain. She is still working towards trying to take him home and is coordinating care with her sister. We spoke frankly about his prognosis and she recognizes that he is likely approaching end of life. We talked about hospice involvement. She had told him previously that he would not want to stop dialysis as he felt that was like "committing suicide." However, she also recognizes that he may reach a point where he either cannot medically tolerate dialysis or becomes too burdensome. We spoke about hospice involvement at home. We also talked about future use of residential hospice. She says her primary goal is to keep him comfortable.    RECOMMENDATIONS and PLAN:  1. Continue supportive care  I spent 30 minutes providing this consultation,  from 1000 to 1030. More than 50% of the time in this consultation was spent coordinating communication.   HISTORY OF PRESENT ILLNESS:  Jack [name] is a 72 y.o. year old male with multiple medical problems including stage IV colon CA with metastasis to liver, lung and omentum with no treatment options; recent gangrene of left ring finger with sepsis requiring amputations of fingers and s/p bilateral BKA, ESRD on HD, DM, PVD. Palliative Care was asked to follow for symptom management, coordination of care, address goals of care and ongoing patient and family education and support. Routine follow up visit made today.   CODE STATUS: DNR  PPS: 30% HOSPICE ELIGIBILITY/DIAGNOSIS: TBD  PAST MEDICAL HISTORY:  Past Medical History:    Diagnosis Date  . Anemia   . Arthritis   . Asthma   . Contracture of joint    left knee  . Decubitus ulcer of sacral region, stage 2 03/07/2014  . Diabetes mellitus with peripheral vascular disease ([place])   . End-stage renal disease on hemodialysis [facility])    Started HD March 2014.  Cause of ESRD was DM.  Gets HD at [facility] on [address] on MWF schedule.   Marland Kitchen ESRD on hemodialysis ([place])   . Gangrene ([place])    right BKA  . Gangrene ([place])    right index finger  . Gangrene of foot ([place])   . Glaucoma   . Glaucoma 03/07/2014  . Heart murmur   . History of MRSA infection 04/22/2013   Bacteremia assoc w L foot wound infection Mar 2015   . Hyperparathyroidism, secondary renal ([place])   . Hypertension   . Hypothyroidism   . MRSA bacteremia   . Multiple myeloma, without mention of having achieved remission 03/30/2012   Cytogenetic neg on 03/23/2012. Pt states he DOES NOT have cancer  . Necrosis ([place])    left long finger  . Peripheral arterial disease ([place])   . Peripheral vascular disease, unspecified ([place]) 11/19/2012   In the past had R foot toe amps then R TMA. In 2015 had left foot toe amp > then TMA >then L BKA on 05/14/13   . Pneumonia    2012  . PONV (postoperative nausea and vomiting)   . Thyroid disease    hyperparathyroidism  SOCIAL HX:  Social History   Tobacco Use  . Smoking status: Former Smoker    Types: Cigarettes    Last attempt to quit: 03/19/1982    Years since quitting: 35.5  . Smokeless tobacco: Never Used  Substance Use Topics  . Alcohol use: No    Alcohol/week: 0.0 standard drinks    Comment: rare    ALLERGIES:  Allergies  Allergen Reactions  . Bee Venom Anaphylaxis  . Ivp Dye [Iodinated Diagnostic Agents] Hives  . Lisinopril Cough  . Morphine And Related Hives and Other (See Comments)    Bradycardia      PERTINENT MEDICATIONS:  Outpatient Encounter Medications as of 09/09/2017  Medication Sig  . amoxicillin-clavulanate (AUGMENTIN) 500-125 MG tablet Take 1  tablet (500 mg total) by mouth at bedtime.  Marland Kitchen aspirin EC 81 MG tablet Take 81 mg by mouth daily.  Marland Kitchen atorvastatin (LIPITOR) 10 MG tablet TAKE 1 TABLET (10 MG TOTAL) BY MOUTH DAILY.  Marland Kitchen Continuous Blood Gluc Sensor (FREESTYLE LIBRE 14 DAY SENSOR) MISC USE DAILY*NOT COVERED*  . Continuous Blood Gluc Sensor (FREESTYLE LIBRE 14 DAY SENSOR) MISC USE DAILY*NOT COVERED*  . famotidine (PEPCID) 20 MG tablet Take 1 tablet (20 mg total) by mouth daily.  Marland Kitchen lanthanum (FOSRENOL) 1000 MG chewable tablet Chew 1,000 mg by mouth daily as needed.   Marland Kitchen levothyroxine (SYNTHROID, LEVOTHROID) 150 MCG tablet TAKE 1/2 TABLET BY MOUTH ON MONDAYS AND 1 TABLET BY MOUTH THE REST OF THE WEEK.  . multivitamin (RENA-VIT) TABS tablet Take 1 tablet by mouth 4 (four) times a week.   . ondansetron (ZOFRAN) 4 MG tablet Take 1 tablet (4 mg total) by mouth every 8 (eight) hours as needed for nausea or vomiting.  Marland Kitchen oxyCODONE (ROXICODONE) 5 MG immediate release tablet 1-2 p.o. every 6 hours as needed pain   No facility-administered encounter medications on file as of 09/09/2017.     PHYSICAL EXAM:   General: NAD, frail appearing, thin, in bed Cardiovascular: irregular Pulmonary: clear ant fields Abdomen: soft, nontender, + bowel sounds Extremities: B. BKA Skin: wound reported but not visualized Neurological: Weakness, less interactive, did not engage in conversatoin  Irean Hong, NP

## 2017-09-11 DIAGNOSIS — L89153 Pressure ulcer of sacral region, stage 3: Secondary | ICD-10-CM | POA: Diagnosis not present

## 2017-09-12 DIAGNOSIS — D631 Anemia in chronic kidney disease: Secondary | ICD-10-CM | POA: Diagnosis not present

## 2017-09-12 DIAGNOSIS — R509 Fever, unspecified: Secondary | ICD-10-CM | POA: Diagnosis not present

## 2017-09-12 DIAGNOSIS — N2581 Secondary hyperparathyroidism of renal origin: Secondary | ICD-10-CM | POA: Diagnosis not present

## 2017-09-12 DIAGNOSIS — N186 End stage renal disease: Secondary | ICD-10-CM | POA: Diagnosis not present

## 2017-09-12 DIAGNOSIS — E1029 Type 1 diabetes mellitus with other diabetic kidney complication: Secondary | ICD-10-CM | POA: Diagnosis not present

## 2017-09-12 DIAGNOSIS — Z992 Dependence on renal dialysis: Secondary | ICD-10-CM | POA: Diagnosis not present

## 2017-09-12 DIAGNOSIS — D689 Coagulation defect, unspecified: Secondary | ICD-10-CM | POA: Diagnosis not present

## 2017-09-12 DIAGNOSIS — T82598A Other mechanical complication of other cardiac and vascular devices and implants, initial encounter: Secondary | ICD-10-CM | POA: Diagnosis not present

## 2017-09-13 DIAGNOSIS — C9000 Multiple myeloma not having achieved remission: Secondary | ICD-10-CM | POA: Diagnosis not present

## 2017-09-14 DIAGNOSIS — Z992 Dependence on renal dialysis: Secondary | ICD-10-CM | POA: Diagnosis not present

## 2017-09-14 DIAGNOSIS — C9000 Multiple myeloma not having achieved remission: Secondary | ICD-10-CM | POA: Diagnosis not present

## 2017-09-14 DIAGNOSIS — N186 End stage renal disease: Secondary | ICD-10-CM | POA: Diagnosis not present

## 2017-09-15 DIAGNOSIS — T82598A Other mechanical complication of other cardiac and vascular devices and implants, initial encounter: Secondary | ICD-10-CM | POA: Diagnosis not present

## 2017-09-15 DIAGNOSIS — N186 End stage renal disease: Secondary | ICD-10-CM | POA: Diagnosis not present

## 2017-09-15 DIAGNOSIS — N2581 Secondary hyperparathyroidism of renal origin: Secondary | ICD-10-CM | POA: Diagnosis not present

## 2017-09-15 DIAGNOSIS — D689 Coagulation defect, unspecified: Secondary | ICD-10-CM | POA: Diagnosis not present

## 2017-09-16 ENCOUNTER — Telehealth: Payer: Self-pay

## 2017-09-16 ENCOUNTER — Non-Acute Institutional Stay: Payer: Medicare Other | Admitting: Internal Medicine

## 2017-09-16 ENCOUNTER — Encounter: Payer: Self-pay | Admitting: Internal Medicine

## 2017-09-16 VITALS — BP 112/66 | HR 80 | Resp 20

## 2017-09-16 DIAGNOSIS — R52 Pain, unspecified: Secondary | ICD-10-CM | POA: Diagnosis not present

## 2017-09-16 DIAGNOSIS — N186 End stage renal disease: Secondary | ICD-10-CM | POA: Diagnosis not present

## 2017-09-16 DIAGNOSIS — R531 Weakness: Secondary | ICD-10-CM

## 2017-09-16 DIAGNOSIS — C189 Malignant neoplasm of colon, unspecified: Secondary | ICD-10-CM | POA: Diagnosis not present

## 2017-09-16 DIAGNOSIS — R5381 Other malaise: Secondary | ICD-10-CM | POA: Diagnosis not present

## 2017-09-16 DIAGNOSIS — Z992 Dependence on renal dialysis: Secondary | ICD-10-CM | POA: Diagnosis not present

## 2017-09-16 DIAGNOSIS — C7800 Secondary malignant neoplasm of unspecified lung: Secondary | ICD-10-CM | POA: Diagnosis not present

## 2017-09-16 NOTE — Telephone Encounter (Signed)
Spoke with Stanton Kidney NP who is to make visit. Phone call placed to [facility], nurse at [facility], and made her aware that [facility] NP to make visit.

## 2017-09-16 NOTE — Telephone Encounter (Signed)
Received phone call from [place], nurse at [facility]. Patient would like to stop dialysis and pursue comfort care only. Family and patient expressed interest in [facility]. Will speak with NP and return call to [facility].

## 2017-09-16 NOTE — Progress Notes (Signed)
PALLIATIVE CARE CONSULT VISIT   PATIENT NAME: Jack [name] DOB: 20-Jul-1945 MRN: [id number]  PRIMARY CARE PROVIDER:   Martinique, Betty G, MD  REFERRING PROVIDER:  Martinique, Betty G, [place], [place] [address]  RESPONSIBLE PARTY:   Arlander Gillen [phone] shared HCPOA with sister Aarion Metzgar [phone], Quentez Lober [phone].  ASSESSMENT:  Called by facility RN Randell Patient, at family's request, for consideration of transition to Hospice services. All three of patient's daughter's present. [name] and [place], who share HCPOA. Another daughter [name] present as well). Patient had shown rapid progressive signs of decline over this last week. PPS is a weak 30%. He is dependent for personal hygiene and feeding. He is now bed bound as it is too painful for him to get up to the wheelchair. He is  lethargic and increasingly confused. Oriented to self only, though recognizes his family. He is taking in only bites for meals, and needs to be fed. He has two small stage 2 pressure injuries on his coccyx, and a larger unstagable pressure injury to his right bottom/lateral stump. He also has a small reddened area on the tip of his right index finger which may be a harbinger of tissue necrosis.He is increasingly intolerant to dialysis, asking to be taken off machine before sessions are complete, and shows marked subsequent lethargy and somnolence after treatment. He has very little urine output. Daughters are all in agreement with withdrawal of dialysis, and initiation of hospice services. I discussed with facility NPJorden Blattenberger, who is in agreement. She plans to fax over the referral. Facility team will continue to act as attending.    RECOMMENDATIONS and PLAN:  1. Hospice referral.  I spent 65 minutes providing this consultation,  from 10:30am to 11:35am.  More than 50% of the time in this consultation was spent coordinating communication.     HISTORY OF PRESENT ILLNESS:  Aniello Christopoulos Thompsonis a 72 y.o.year oldmalewith multiple medical problems including stage IV colon CA with metastasis to liver, lung and omentum with no treatment options; recent gangrene of left ring finger with sepsis requiring amputations of fingers and s/p bilateral BKA, ESRD on HD, DM, PVD. Patient has been showing continued progressive cognitive and functional decline. Family does not wish to continue dialysis. Palliative Care was called to see patient at his bedside this morning to discuss transition to [facility], and possible admission to [facility] thereafter if meets criteria.    CODE STATUS: DNR  PPS: 30% HOSPICE ELIGIBILITY/DIAGNOSIS: yes/ESRD (discontinuing dialysis), metastatic cancer  PAST MEDICAL HISTORY:  Past Medical History:  Diagnosis Date  . Anemia   . Arthritis   . Asthma   . Contracture of joint    left knee  . Decubitus ulcer of sacral region, stage 2 03/07/2014  . Diabetes mellitus with peripheral vascular disease ([place])   . End-stage renal disease on hemodialysis [facility])    Started HD March 2014.  Cause of ESRD was DM.  Gets HD at [facility] on [address] on MWF schedule.   Marland Kitchen ESRD on hemodialysis ([place])   . Gangrene ([place])    right BKA  . Gangrene ([place])    right index finger  . Gangrene of foot ([place])   . Glaucoma   . Glaucoma 03/07/2014  . Heart murmur   . History of MRSA infection 04/22/2013   Bacteremia assoc w L foot wound infection Mar 2015   . Hyperparathyroidism, secondary renal ([place])   .  Hypertension   . Hypothyroidism   . MRSA bacteremia   . Multiple myeloma, without mention of having achieved remission 03/30/2012   Cytogenetic neg on 03/23/2012. Pt states he DOES NOT have cancer  . Necrosis ([place])    left long finger  . Peripheral arterial disease ([place])   . Peripheral vascular disease, unspecified ([place]) 11/19/2012   In the past had R foot toe amps then R TMA. In 2015 had left foot toe amp > then TMA >then L BKA  on 05/14/13   . Pneumonia    2012  . PONV (postoperative nausea and vomiting)   . Thyroid disease    hyperparathyroidism    SOCIAL HX:  Social History   Tobacco Use  . Smoking status: Former Smoker    Types: Cigarettes    Last attempt to quit: 03/19/1982    Years since quitting: 35.5  . Smokeless tobacco: Never Used  Substance Use Topics  . Alcohol use: No    Alcohol/week: 0.0 standard drinks    Comment: rare    ALLERGIES:  Allergies  Allergen Reactions  . Bee Venom Anaphylaxis  . Ivp Dye [Iodinated Diagnostic Agents] Hives  . Lisinopril Cough  . Morphine And Related Hives and Other (See Comments)    Bradycardia      PERTINENT MEDICATIONS:  Outpatient Encounter Medications as of 09/16/2017  Medication Sig  . amoxicillin-clavulanate (AUGMENTIN) 500-125 MG tablet Take 1 tablet (500 mg total) by mouth at bedtime.  Marland Kitchen aspirin EC 81 MG tablet Take 81 mg by mouth daily.  Marland Kitchen atorvastatin (LIPITOR) 10 MG tablet TAKE 1 TABLET (10 MG TOTAL) BY MOUTH DAILY.  Marland Kitchen Continuous Blood Gluc Sensor (FREESTYLE LIBRE 14 DAY SENSOR) MISC USE DAILY*NOT COVERED*  . Continuous Blood Gluc Sensor (FREESTYLE LIBRE 14 DAY SENSOR) MISC USE DAILY*NOT COVERED*  . famotidine (PEPCID) 20 MG tablet Take 1 tablet (20 mg total) by mouth daily.  Marland Kitchen lanthanum (FOSRENOL) 1000 MG chewable tablet Chew 1,000 mg by mouth daily as needed.   Marland Kitchen levothyroxine (SYNTHROID, LEVOTHROID) 150 MCG tablet TAKE 1/2 TABLET BY MOUTH ON MONDAYS AND 1 TABLET BY MOUTH THE REST OF THE WEEK.  . multivitamin (RENA-VIT) TABS tablet Take 1 tablet by mouth 4 (four) times a week.   . ondansetron (ZOFRAN) 4 MG tablet Take 1 tablet (4 mg total) by mouth every 8 (eight) hours as needed for nausea or vomiting.  Marland Kitchen oxyCODONE (ROXICODONE) 5 MG immediate release tablet 1-2 p.o. every 6 hours as needed pain   No facility-administered encounter medications on file as of 09/16/2017.     PHYSICAL EXAM:   General: NAD, frail appearing, thin. Lying supine in  bed. Oriented to self only.  Cardiovascular: regular rate and rhythm Pulmonary: clear ant fields Abdomen: soft, nontender, + bowel sounds. Mildly distended Extremities: Bilateral BKA Skin: Two, stage 2 pressure injuries coccyx (1/2" each), and unstagable tissue injury (about 2" round) R lateral / bottom stump Neurological: Weak and lethargic but otherwise nonfocal  Julianne Handler, NP

## 2017-09-17 DIAGNOSIS — C787 Secondary malignant neoplasm of liver and intrahepatic bile duct: Secondary | ICD-10-CM | POA: Diagnosis not present

## 2017-09-17 DIAGNOSIS — W19XXXA Unspecified fall, initial encounter: Secondary | ICD-10-CM | POA: Diagnosis not present

## 2017-09-17 DIAGNOSIS — L8990 Pressure ulcer of unspecified site, unspecified stage: Secondary | ICD-10-CM | POA: Diagnosis not present

## 2017-09-17 DIAGNOSIS — C189 Malignant neoplasm of colon, unspecified: Secondary | ICD-10-CM | POA: Diagnosis not present

## 2017-09-17 DIAGNOSIS — I739 Peripheral vascular disease, unspecified: Secondary | ICD-10-CM | POA: Diagnosis not present

## 2017-09-17 DIAGNOSIS — N186 End stage renal disease: Secondary | ICD-10-CM | POA: Diagnosis not present

## 2017-09-17 DIAGNOSIS — C786 Secondary malignant neoplasm of retroperitoneum and peritoneum: Secondary | ICD-10-CM | POA: Diagnosis not present

## 2017-09-17 DIAGNOSIS — G9340 Encephalopathy, unspecified: Secondary | ICD-10-CM | POA: Diagnosis not present

## 2017-09-17 DIAGNOSIS — C7800 Secondary malignant neoplasm of unspecified lung: Secondary | ICD-10-CM | POA: Diagnosis not present

## 2017-09-17 DIAGNOSIS — G893 Neoplasm related pain (acute) (chronic): Secondary | ICD-10-CM | POA: Diagnosis not present

## 2017-09-18 DIAGNOSIS — G893 Neoplasm related pain (acute) (chronic): Secondary | ICD-10-CM | POA: Diagnosis not present

## 2017-09-18 DIAGNOSIS — N186 End stage renal disease: Secondary | ICD-10-CM | POA: Diagnosis not present

## 2017-09-18 DIAGNOSIS — C787 Secondary malignant neoplasm of liver and intrahepatic bile duct: Secondary | ICD-10-CM | POA: Diagnosis not present

## 2017-09-18 DIAGNOSIS — C786 Secondary malignant neoplasm of retroperitoneum and peritoneum: Secondary | ICD-10-CM | POA: Diagnosis not present

## 2017-09-18 DIAGNOSIS — L8990 Pressure ulcer of unspecified site, unspecified stage: Secondary | ICD-10-CM | POA: Diagnosis not present

## 2017-09-18 DIAGNOSIS — I739 Peripheral vascular disease, unspecified: Secondary | ICD-10-CM | POA: Diagnosis not present

## 2017-09-18 DIAGNOSIS — C7800 Secondary malignant neoplasm of unspecified lung: Secondary | ICD-10-CM | POA: Diagnosis not present

## 2017-09-18 DIAGNOSIS — R5381 Other malaise: Secondary | ICD-10-CM | POA: Diagnosis not present

## 2017-09-18 DIAGNOSIS — G9340 Encephalopathy, unspecified: Secondary | ICD-10-CM | POA: Diagnosis not present

## 2017-09-18 DIAGNOSIS — C189 Malignant neoplasm of colon, unspecified: Secondary | ICD-10-CM | POA: Diagnosis not present

## 2017-09-19 DIAGNOSIS — N186 End stage renal disease: Secondary | ICD-10-CM | POA: Diagnosis not present

## 2017-09-19 DIAGNOSIS — G9340 Encephalopathy, unspecified: Secondary | ICD-10-CM | POA: Diagnosis not present

## 2017-09-19 DIAGNOSIS — C786 Secondary malignant neoplasm of retroperitoneum and peritoneum: Secondary | ICD-10-CM | POA: Diagnosis not present

## 2017-09-19 DIAGNOSIS — C787 Secondary malignant neoplasm of liver and intrahepatic bile duct: Secondary | ICD-10-CM | POA: Diagnosis not present

## 2017-09-19 DIAGNOSIS — L8990 Pressure ulcer of unspecified site, unspecified stage: Secondary | ICD-10-CM | POA: Diagnosis not present

## 2017-09-19 DIAGNOSIS — C189 Malignant neoplasm of colon, unspecified: Secondary | ICD-10-CM | POA: Diagnosis not present

## 2017-09-19 DIAGNOSIS — I739 Peripheral vascular disease, unspecified: Secondary | ICD-10-CM | POA: Diagnosis not present

## 2017-09-19 DIAGNOSIS — C7800 Secondary malignant neoplasm of unspecified lung: Secondary | ICD-10-CM | POA: Diagnosis not present

## 2017-09-20 DIAGNOSIS — C787 Secondary malignant neoplasm of liver and intrahepatic bile duct: Secondary | ICD-10-CM | POA: Diagnosis not present

## 2017-09-20 DIAGNOSIS — I739 Peripheral vascular disease, unspecified: Secondary | ICD-10-CM | POA: Diagnosis not present

## 2017-09-20 DIAGNOSIS — C189 Malignant neoplasm of colon, unspecified: Secondary | ICD-10-CM | POA: Diagnosis not present

## 2017-09-20 DIAGNOSIS — L8990 Pressure ulcer of unspecified site, unspecified stage: Secondary | ICD-10-CM | POA: Diagnosis not present

## 2017-09-20 DIAGNOSIS — C7800 Secondary malignant neoplasm of unspecified lung: Secondary | ICD-10-CM | POA: Diagnosis not present

## 2017-09-20 DIAGNOSIS — N186 End stage renal disease: Secondary | ICD-10-CM | POA: Diagnosis not present

## 2017-09-20 DIAGNOSIS — C786 Secondary malignant neoplasm of retroperitoneum and peritoneum: Secondary | ICD-10-CM | POA: Diagnosis not present

## 2017-09-20 DIAGNOSIS — G9340 Encephalopathy, unspecified: Secondary | ICD-10-CM | POA: Diagnosis not present

## 2017-09-21 ENCOUNTER — Other Ambulatory Visit: Payer: Self-pay | Admitting: Family Medicine

## 2017-09-21 DIAGNOSIS — L8990 Pressure ulcer of unspecified site, unspecified stage: Secondary | ICD-10-CM | POA: Diagnosis not present

## 2017-09-21 DIAGNOSIS — C787 Secondary malignant neoplasm of liver and intrahepatic bile duct: Secondary | ICD-10-CM | POA: Diagnosis not present

## 2017-09-21 DIAGNOSIS — C786 Secondary malignant neoplasm of retroperitoneum and peritoneum: Secondary | ICD-10-CM | POA: Diagnosis not present

## 2017-09-21 DIAGNOSIS — C7800 Secondary malignant neoplasm of unspecified lung: Secondary | ICD-10-CM | POA: Diagnosis not present

## 2017-09-21 DIAGNOSIS — I739 Peripheral vascular disease, unspecified: Secondary | ICD-10-CM | POA: Diagnosis not present

## 2017-09-21 DIAGNOSIS — N186 End stage renal disease: Secondary | ICD-10-CM | POA: Diagnosis not present

## 2017-09-21 DIAGNOSIS — G9340 Encephalopathy, unspecified: Secondary | ICD-10-CM | POA: Diagnosis not present

## 2017-09-21 DIAGNOSIS — C189 Malignant neoplasm of colon, unspecified: Secondary | ICD-10-CM | POA: Diagnosis not present

## 2017-09-22 DIAGNOSIS — C7800 Secondary malignant neoplasm of unspecified lung: Secondary | ICD-10-CM | POA: Diagnosis not present

## 2017-09-22 DIAGNOSIS — C189 Malignant neoplasm of colon, unspecified: Secondary | ICD-10-CM | POA: Diagnosis not present

## 2017-09-22 DIAGNOSIS — C786 Secondary malignant neoplasm of retroperitoneum and peritoneum: Secondary | ICD-10-CM | POA: Diagnosis not present

## 2017-09-22 DIAGNOSIS — L8990 Pressure ulcer of unspecified site, unspecified stage: Secondary | ICD-10-CM | POA: Diagnosis not present

## 2017-09-22 DIAGNOSIS — N186 End stage renal disease: Secondary | ICD-10-CM | POA: Diagnosis not present

## 2017-09-22 DIAGNOSIS — G9340 Encephalopathy, unspecified: Secondary | ICD-10-CM | POA: Diagnosis not present

## 2017-09-22 DIAGNOSIS — I739 Peripheral vascular disease, unspecified: Secondary | ICD-10-CM | POA: Diagnosis not present

## 2017-09-22 DIAGNOSIS — C787 Secondary malignant neoplasm of liver and intrahepatic bile duct: Secondary | ICD-10-CM | POA: Diagnosis not present

## 2017-09-24 ENCOUNTER — Non-Acute Institutional Stay: Payer: Medicare Other | Admitting: Hospice and Palliative Medicine

## 2017-09-24 DIAGNOSIS — Z515 Encounter for palliative care: Secondary | ICD-10-CM

## 2017-09-24 NOTE — Progress Notes (Signed)
Follow up visit attempted. Was informed by facility staff that patient had passed.

## 2017-10-14 DEATH — deceased

## 2018-01-20 ENCOUNTER — Ambulatory Visit: Payer: Medicare Other | Admitting: Family Medicine

## 2018-07-21 IMAGING — CT CT ABD-PELV W/O CM
2 of 5 series · 16 of 46 positions shown, 18 images · non-contrast
Comparison: CT abdomen and pelvis 02/24/2013.

ADDENDUM:
These results were called by telephone at the time of interpretation
on 08/01/2017 prior to signing off the report at approximately [DATE]
a.m. to Dr. BIMAR TONORE , who verbally acknowledged these results.
CLINICAL DATA: Abdominal pain with nausea and vomiting beginning
today.

EXAM:
CT ABDOMEN AND PELVIS WITHOUT CONTRAST
TECHNIQUE: Multidetector CT imaging of the abdomen and pelvis was performed
following the standard protocol without IV contrast.

[Series 3: abd/ pelvis 5.0 i30f 2 · axial · 0.85mm/px · z∈[+895,+1365]mm · 13 of 106 slices shown, 15 images]
[im 6/106  soft-tissue]
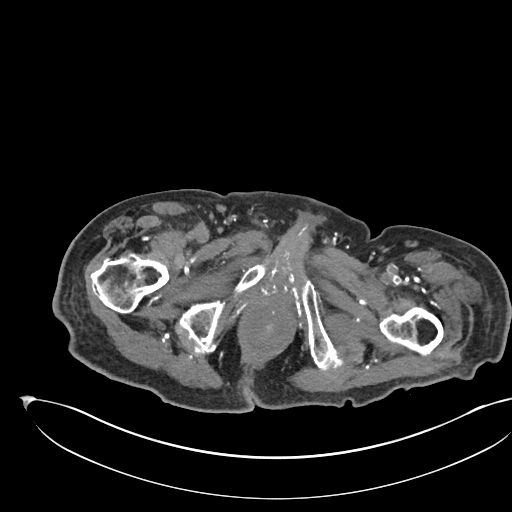
[im 6/106  bone]
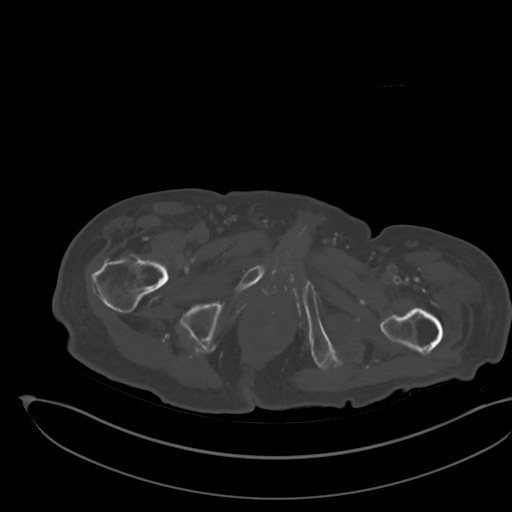
[im 16/106  soft-tissue]
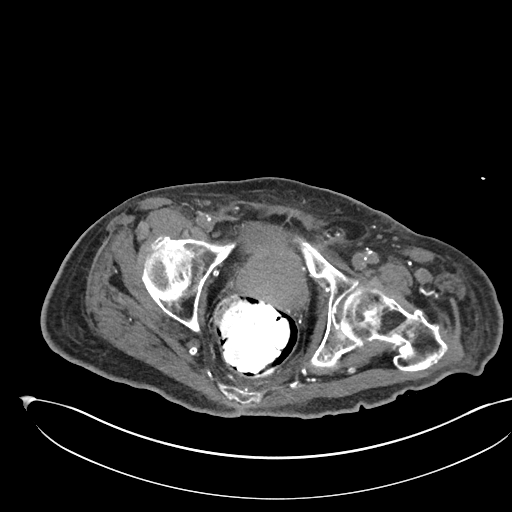
[im 22/106  soft-tissue]
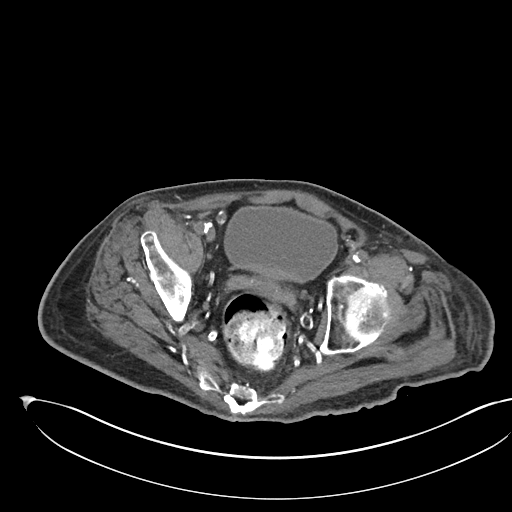
[im 32/106  soft-tissue]
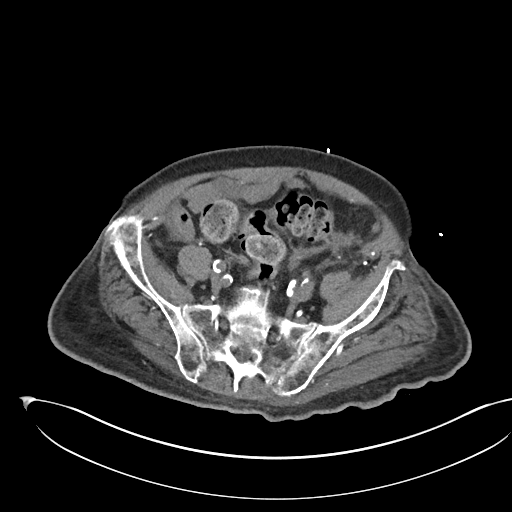
[im 37/106  soft-tissue]
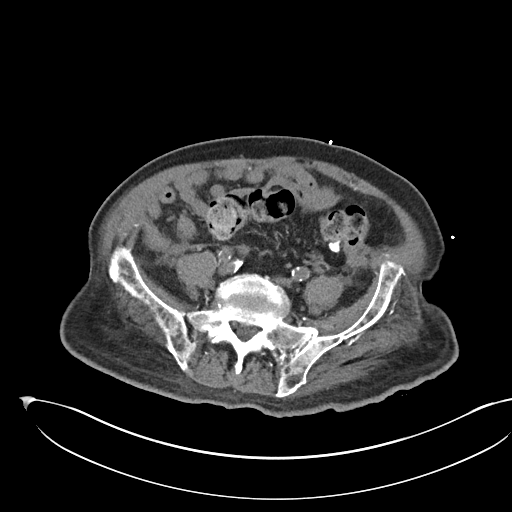
[im 48/106  soft-tissue]
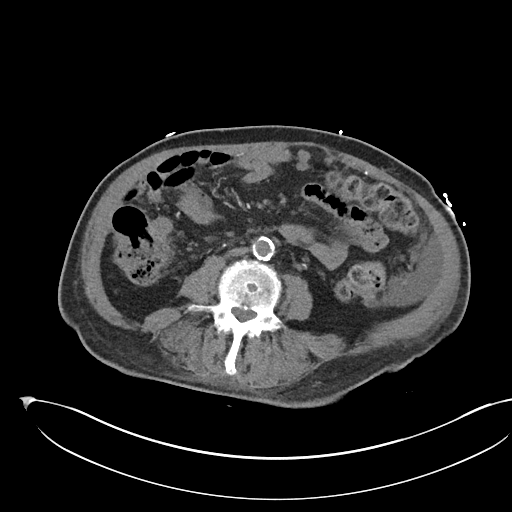
[im 53/106  soft-tissue]
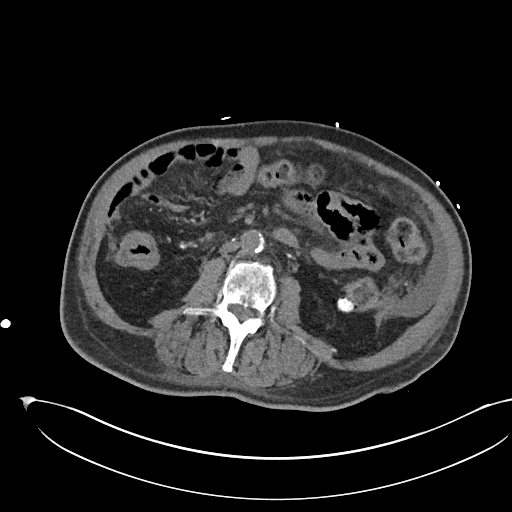
[im 58/106  soft-tissue]
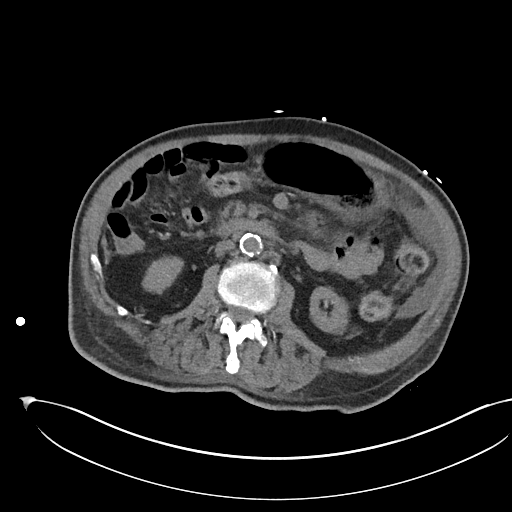
[im 69/106  soft-tissue]
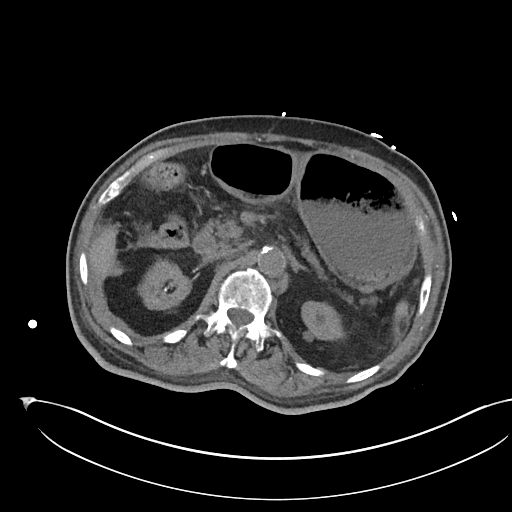
[im 69/106  bone]
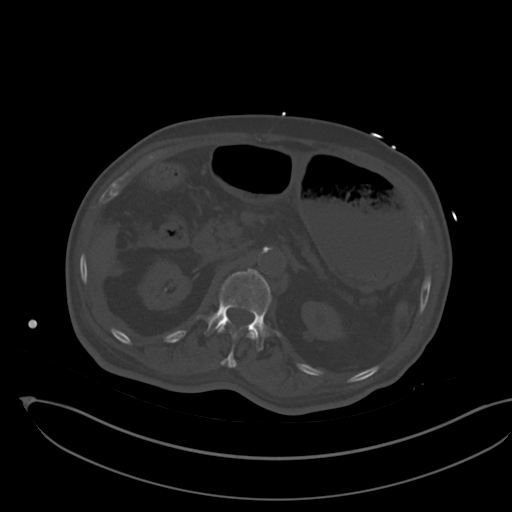
[im 74/106  soft-tissue]
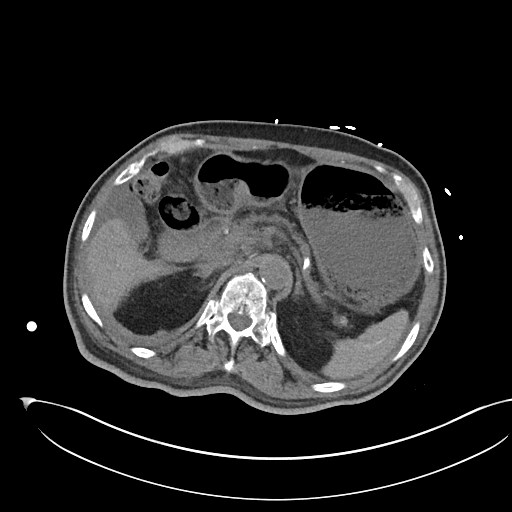
[im 85/106  soft-tissue]
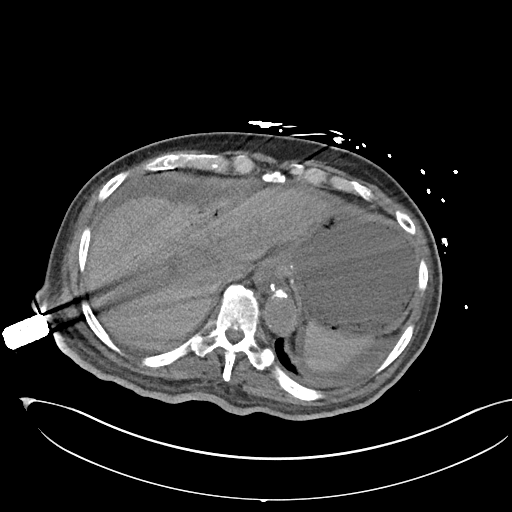
[im 90/106  soft-tissue]
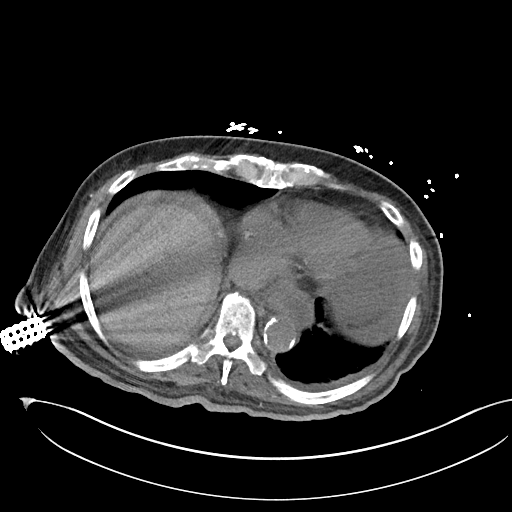
[im 100/106  soft-tissue]
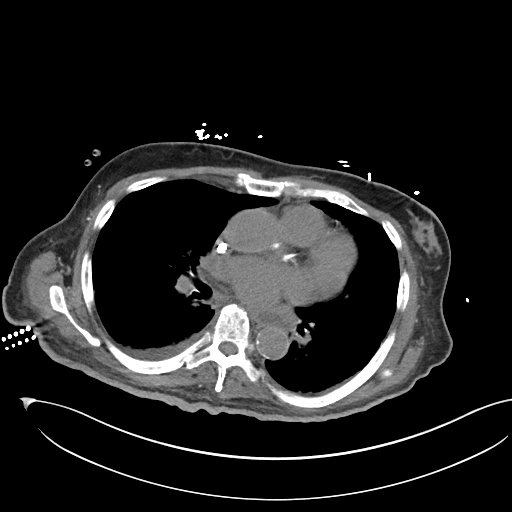

[Series 6: cor st · coronal · 0.97mm/px · 3 of 96 slices shown]
[im 32/96  soft-tissue]
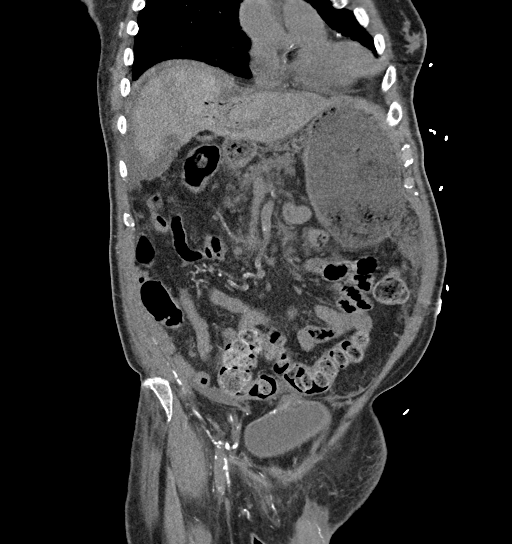
[im 43/96  soft-tissue]
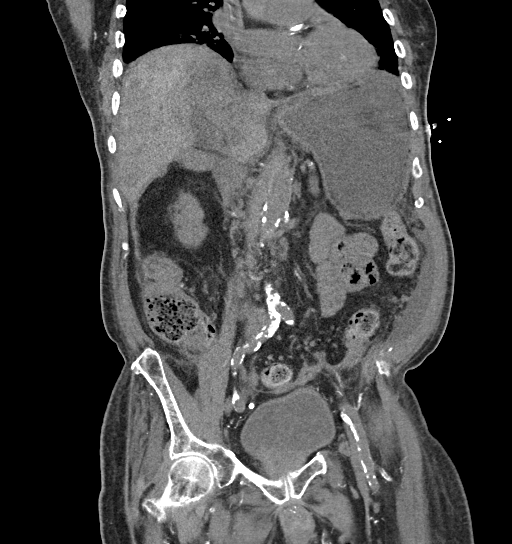
[im 53/96  soft-tissue]
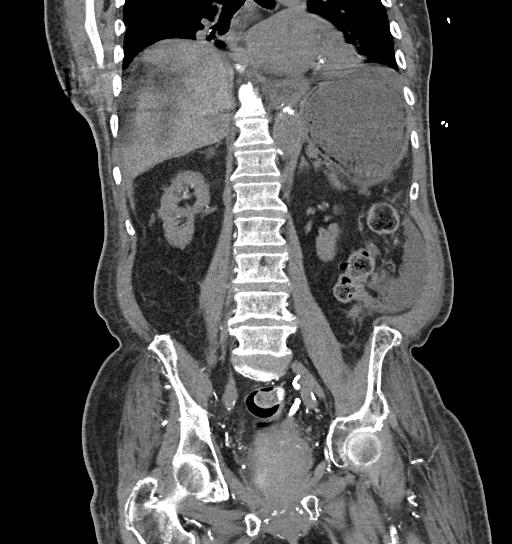

[16 of 46 positions shown; findings below may reference images not displayed]

FINDINGS: Lower chest: Innumerable pulmonary nodules are seen in the lung
bases and are new since the prior CT scan index nodules include a
0.7 cm nodule in the left lower lobe on image 2 and a second 0.7 cm
nodule in the left lower lobe on image 7. Small bilateral pleural
effusions are present, larger on the left. No pericardial effusion.
Extensive calcific aortic and coronary atherosclerosis is noted.

Hepatobiliary: Portal venous gas is identified. There is a low
attenuating lesion inferior right hepatic lobe measuring 1.8 cm on
image 29. Visualization of the liver is limited due to streak
artifact from a metallic device along the right flank. Gallbladder
and biliary tree are unremarkable.

Pancreas: Severely atrophic as seen on the prior CT. No focal lesion
or biliary ductal dilatation.

Spleen: Normal in size.  No focal lesion.

Adrenals/Urinary Tract: Marked renal atrophy is seen bilaterally. No
focal renal lesion. Urinary bladder stone is unchanged. The adrenal
glands appear normal.

Stomach/Bowel: Pneumatosis is seen in the stomach wall. The stomach
is moderately dilated. Focal wall thickening is seen in the mid
descending colon. Focal wall seen on image 54 series 3 thickening of
the ascending colon as is highly suspicious for carcinoma. There is
scattered diverticulosis.

Vascular/Lymphatic: Extensive atherosclerotic vascular disease is
identified. No aneurysm. No lymphadenopathy.

Reproductive: Prostate is enlarged.

Other: There is a small volume of abdominal ascites. Nodularity of
the omentum is identified and most conspicuous in the left abdomen.
An omental lymph node measuring 1.7 cm is seen on image 59. No free
intraperitoneal air is seen.

Musculoskeletal: No fracture or focal lesion.
IMPRESSION: Gastric pneumatosis and portal venous gas can be seen in infection
of the stomach wall by gas-forming organisms and presumably gastric
ischemia.

Findings most consistent with carcinoma of the ascending colon with
innumerable pulmonary metastases, omental metastases and a single
liver metastasis seen on this uninfused exam.

Small bilateral pleural effusions.

Extensive atherosclerosis.

## 2021-01-16 NOTE — Progress Notes (Signed)
This encounter was created in error - please disregard.
